# Patient Record
Sex: Male | Born: 1945 | Race: White | Hispanic: No | Marital: Married | Smoking: Never smoker
Health system: Southern US, Community
[De-identification: ages and names within clinical notes are randomized; demographics above are authoritative.]

## PROBLEM LIST (undated history)

## (undated) DIAGNOSIS — Z9981 Dependence on supplemental oxygen: Secondary | ICD-10-CM

## (undated) DIAGNOSIS — J9602 Acute respiratory failure with hypercapnia: Secondary | ICD-10-CM

## (undated) DIAGNOSIS — R7303 Prediabetes: Secondary | ICD-10-CM

## (undated) DIAGNOSIS — M1990 Unspecified osteoarthritis, unspecified site: Secondary | ICD-10-CM

## (undated) DIAGNOSIS — E291 Testicular hypofunction: Secondary | ICD-10-CM

## (undated) DIAGNOSIS — K649 Unspecified hemorrhoids: Secondary | ICD-10-CM

## (undated) DIAGNOSIS — E119 Type 2 diabetes mellitus without complications: Secondary | ICD-10-CM

## (undated) DIAGNOSIS — G8929 Other chronic pain: Secondary | ICD-10-CM

## (undated) DIAGNOSIS — G9340 Encephalopathy, unspecified: Secondary | ICD-10-CM

## (undated) DIAGNOSIS — E785 Hyperlipidemia, unspecified: Secondary | ICD-10-CM

## (undated) DIAGNOSIS — I5032 Chronic diastolic (congestive) heart failure: Secondary | ICD-10-CM

## (undated) DIAGNOSIS — R7989 Other specified abnormal findings of blood chemistry: Secondary | ICD-10-CM

## (undated) DIAGNOSIS — J309 Allergic rhinitis, unspecified: Secondary | ICD-10-CM

## (undated) DIAGNOSIS — I208 Other forms of angina pectoris: Secondary | ICD-10-CM

## (undated) DIAGNOSIS — I25118 Atherosclerotic heart disease of native coronary artery with other forms of angina pectoris: Secondary | ICD-10-CM

## (undated) DIAGNOSIS — I4891 Unspecified atrial fibrillation: Secondary | ICD-10-CM

## (undated) DIAGNOSIS — I480 Paroxysmal atrial fibrillation: Secondary | ICD-10-CM

## (undated) DIAGNOSIS — R270 Ataxia, unspecified: Secondary | ICD-10-CM

## (undated) DIAGNOSIS — J9601 Acute respiratory failure with hypoxia: Secondary | ICD-10-CM

## (undated) DIAGNOSIS — Z973 Presence of spectacles and contact lenses: Secondary | ICD-10-CM

## (undated) DIAGNOSIS — E538 Deficiency of other specified B group vitamins: Secondary | ICD-10-CM

## (undated) DIAGNOSIS — M109 Gout, unspecified: Secondary | ICD-10-CM

## (undated) DIAGNOSIS — I10 Essential (primary) hypertension: Secondary | ICD-10-CM

## (undated) DIAGNOSIS — J986 Disorders of diaphragm: Secondary | ICD-10-CM

## (undated) DIAGNOSIS — R55 Syncope and collapse: Secondary | ICD-10-CM

## (undated) DIAGNOSIS — I219 Acute myocardial infarction, unspecified: Secondary | ICD-10-CM

## (undated) DIAGNOSIS — I639 Cerebral infarction, unspecified: Secondary | ICD-10-CM

## (undated) DIAGNOSIS — K219 Gastro-esophageal reflux disease without esophagitis: Secondary | ICD-10-CM

## (undated) DIAGNOSIS — G4733 Obstructive sleep apnea (adult) (pediatric): Secondary | ICD-10-CM

## (undated) DIAGNOSIS — E039 Hypothyroidism, unspecified: Secondary | ICD-10-CM

## (undated) DIAGNOSIS — H8110 Benign paroxysmal vertigo, unspecified ear: Secondary | ICD-10-CM

## (undated) DIAGNOSIS — Z7901 Long term (current) use of anticoagulants: Secondary | ICD-10-CM

## (undated) DIAGNOSIS — I700 Atherosclerosis of aorta: Secondary | ICD-10-CM

## (undated) DIAGNOSIS — G4700 Insomnia, unspecified: Secondary | ICD-10-CM

## (undated) DIAGNOSIS — D126 Benign neoplasm of colon, unspecified: Secondary | ICD-10-CM

## (undated) DIAGNOSIS — E1139 Type 2 diabetes mellitus with other diabetic ophthalmic complication: Secondary | ICD-10-CM

## (undated) DIAGNOSIS — M545 Low back pain: Secondary | ICD-10-CM

## (undated) HISTORY — DX: Insomnia, unspecified: G47.00

## (undated) HISTORY — DX: Syncope and collapse: R55

## (undated) HISTORY — DX: Gastro-esophageal reflux disease without esophagitis: K21.9

## (undated) HISTORY — DX: Chronic diastolic (congestive) heart failure: I50.32

## (undated) HISTORY — DX: Atherosclerotic heart disease of native coronary artery with other forms of angina pectoris: I25.118

## (undated) HISTORY — DX: Other forms of angina pectoris: I20.8

## (undated) HISTORY — DX: Benign paroxysmal vertigo, unspecified ear: H81.10

## (undated) HISTORY — DX: Benign neoplasm of colon, unspecified: D12.6

## (undated) HISTORY — PX: CORONARY ARTERY BYPASS GRAFT: SHX141

## (undated) HISTORY — DX: Allergic rhinitis, unspecified: J30.9

## (undated) HISTORY — DX: Low back pain: M54.5

## (undated) HISTORY — DX: Obstructive sleep apnea (adult) (pediatric): G47.33

## (undated) HISTORY — PX: PERCUTANEOUS PLACEMENT INTRAVASCULAR STENT CERVICAL CAROTID ARTERY: SUR1019

## (undated) HISTORY — DX: Acute respiratory failure with hypercapnia: J96.02

## (undated) HISTORY — DX: Long term (current) use of anticoagulants: Z79.01

## (undated) HISTORY — PX: NASAL SEPTUM SURGERY: SHX37

## (undated) HISTORY — DX: Type 2 diabetes mellitus without complications: E11.9

## (undated) HISTORY — DX: Gout, unspecified: M10.9

## (undated) HISTORY — DX: Acute respiratory failure with hypoxia: J96.01

## (undated) HISTORY — DX: Hypothyroidism, unspecified: E03.9

## (undated) HISTORY — DX: Other chronic pain: G89.29

## (undated) HISTORY — DX: Other specified abnormal findings of blood chemistry: R79.89

## (undated) HISTORY — DX: Atherosclerosis of aorta: I70.0

## (undated) HISTORY — DX: Type 2 diabetes mellitus with other diabetic ophthalmic complication: E11.39

## (undated) HISTORY — DX: Ataxia, unspecified: R27.0

## (undated) HISTORY — DX: Testicular hypofunction: E29.1

## (undated) HISTORY — DX: Deficiency of other specified B group vitamins: E53.8

## (undated) HISTORY — DX: Hyperlipidemia, unspecified: E78.5

## (undated) HISTORY — DX: Cerebral infarction, unspecified: I63.9

## (undated) HISTORY — DX: Essential (primary) hypertension: I10

## (undated) HISTORY — DX: Encephalopathy, unspecified: G93.40

## (undated) HISTORY — DX: Paroxysmal atrial fibrillation: I48.0

---
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
## 2004-11-08 ENCOUNTER — Ambulatory Visit: Payer: Self-pay | Admitting: Internal Medicine

## 2004-11-09 ENCOUNTER — Inpatient Hospital Stay (HOSPITAL_COMMUNITY): Admission: EM | Admit: 2004-11-09 | Discharge: 2004-11-13 | Payer: Self-pay | Admitting: Emergency Medicine

## 2004-11-11 ENCOUNTER — Ambulatory Visit: Payer: Self-pay | Admitting: Cardiology

## 2004-11-20 ENCOUNTER — Ambulatory Visit: Payer: Self-pay

## 2004-12-04 ENCOUNTER — Ambulatory Visit: Payer: Self-pay | Admitting: Gastroenterology

## 2004-12-11 ENCOUNTER — Ambulatory Visit: Payer: Self-pay | Admitting: *Deleted

## 2005-02-24 ENCOUNTER — Ambulatory Visit: Payer: Self-pay | Admitting: Internal Medicine

## 2005-03-07 ENCOUNTER — Ambulatory Visit: Payer: Self-pay | Admitting: Gastroenterology

## 2005-03-25 ENCOUNTER — Encounter (INDEPENDENT_AMBULATORY_CARE_PROVIDER_SITE_OTHER): Payer: Self-pay | Admitting: *Deleted

## 2005-03-25 ENCOUNTER — Ambulatory Visit: Payer: Self-pay | Admitting: Gastroenterology

## 2005-03-25 HISTORY — PX: ESOPHAGOGASTRODUODENOSCOPY: SHX1529

## 2005-03-31 ENCOUNTER — Ambulatory Visit: Payer: Self-pay | Admitting: Internal Medicine

## 2005-04-01 ENCOUNTER — Ambulatory Visit: Payer: Self-pay | Admitting: *Deleted

## 2005-04-16 ENCOUNTER — Ambulatory Visit: Payer: Self-pay | Admitting: Internal Medicine

## 2005-05-16 ENCOUNTER — Ambulatory Visit: Payer: Self-pay | Admitting: Internal Medicine

## 2005-08-08 ENCOUNTER — Ambulatory Visit: Payer: Self-pay | Admitting: Internal Medicine

## 2005-08-18 ENCOUNTER — Ambulatory Visit: Payer: Self-pay | Admitting: Cardiology

## 2005-08-18 ENCOUNTER — Observation Stay (HOSPITAL_COMMUNITY): Admission: EM | Admit: 2005-08-18 | Discharge: 2005-08-20 | Payer: Self-pay | Admitting: Emergency Medicine

## 2005-08-28 ENCOUNTER — Encounter: Payer: Self-pay | Admitting: Cardiology

## 2005-08-28 ENCOUNTER — Ambulatory Visit: Payer: Self-pay

## 2005-09-03 ENCOUNTER — Ambulatory Visit: Payer: Self-pay | Admitting: *Deleted

## 2005-09-18 ENCOUNTER — Ambulatory Visit (HOSPITAL_COMMUNITY): Admission: RE | Admit: 2005-09-18 | Discharge: 2005-09-18 | Payer: Self-pay | Admitting: Otolaryngology

## 2005-10-08 ENCOUNTER — Ambulatory Visit: Payer: Self-pay | Admitting: Internal Medicine

## 2005-10-29 ENCOUNTER — Emergency Department (HOSPITAL_COMMUNITY): Admission: EM | Admit: 2005-10-29 | Discharge: 2005-10-29 | Payer: Self-pay | Admitting: Emergency Medicine

## 2005-11-20 ENCOUNTER — Ambulatory Visit: Payer: Self-pay | Admitting: *Deleted

## 2005-11-24 ENCOUNTER — Ambulatory Visit: Payer: Self-pay | Admitting: Internal Medicine

## 2005-11-26 ENCOUNTER — Ambulatory Visit (HOSPITAL_BASED_OUTPATIENT_CLINIC_OR_DEPARTMENT_OTHER): Admission: RE | Admit: 2005-11-26 | Discharge: 2005-11-26 | Payer: Self-pay | Admitting: Internal Medicine

## 2005-11-30 ENCOUNTER — Ambulatory Visit: Payer: Self-pay | Admitting: Internal Medicine

## 2005-12-10 ENCOUNTER — Ambulatory Visit: Payer: Self-pay | Admitting: Internal Medicine

## 2005-12-16 ENCOUNTER — Ambulatory Visit: Payer: Self-pay | Admitting: Internal Medicine

## 2006-01-16 ENCOUNTER — Ambulatory Visit: Payer: Self-pay | Admitting: Internal Medicine

## 2006-03-05 ENCOUNTER — Ambulatory Visit: Payer: Self-pay | Admitting: Cardiology

## 2006-03-05 ENCOUNTER — Observation Stay (HOSPITAL_COMMUNITY): Admission: EM | Admit: 2006-03-05 | Discharge: 2006-03-06 | Payer: Self-pay | Admitting: Emergency Medicine

## 2006-03-11 ENCOUNTER — Ambulatory Visit: Payer: Self-pay | Admitting: Endocrinology

## 2006-03-18 ENCOUNTER — Ambulatory Visit: Payer: Self-pay | Admitting: *Deleted

## 2006-04-21 ENCOUNTER — Ambulatory Visit: Payer: Self-pay | Admitting: Internal Medicine

## 2006-05-06 ENCOUNTER — Encounter: Admission: RE | Admit: 2006-05-06 | Discharge: 2006-05-06 | Payer: Self-pay | Admitting: Otolaryngology

## 2006-06-17 ENCOUNTER — Ambulatory Visit: Payer: Self-pay | Admitting: Internal Medicine

## 2006-06-30 ENCOUNTER — Ambulatory Visit: Payer: Self-pay

## 2006-06-30 ENCOUNTER — Ambulatory Visit: Payer: Self-pay | Admitting: *Deleted

## 2006-06-30 LAB — CONVERTED CEMR LAB
AST: 25 units/L (ref 0–37)
Albumin: 3.5 g/dL (ref 3.5–5.2)
Bilirubin, Direct: 0.1 mg/dL (ref 0.0–0.3)
Cholesterol: 135 mg/dL (ref 0–200)
GFR calc Af Amer: 110 mL/min
GFR calc non Af Amer: 91 mL/min
Glucose, Bld: 95 mg/dL (ref 70–99)
HDL: 43 mg/dL (ref >39.0)
LDL Cholesterol: 76 mg/dL (ref 0–99)
Potassium: 4.2 meq/L (ref 3.5–5.1)
Sodium: 146 meq/L — ABNORMAL HIGH (ref 135–145)
Total CHOL/HDL Ratio: 3.1
Triglycerides: 79 mg/dL (ref 0–149)
VLDL: 16 mg/dL (ref 0–40)

## 2006-07-03 ENCOUNTER — Emergency Department (HOSPITAL_COMMUNITY): Admission: EM | Admit: 2006-07-03 | Discharge: 2006-07-03 | Payer: Self-pay | Admitting: Emergency Medicine

## 2006-07-29 ENCOUNTER — Ambulatory Visit (HOSPITAL_BASED_OUTPATIENT_CLINIC_OR_DEPARTMENT_OTHER): Admission: RE | Admit: 2006-07-29 | Discharge: 2006-07-29 | Payer: Self-pay | Admitting: Orthopedic Surgery

## 2006-09-19 ENCOUNTER — Emergency Department (HOSPITAL_COMMUNITY): Admission: EM | Admit: 2006-09-19 | Discharge: 2006-09-19 | Payer: Self-pay | Admitting: Emergency Medicine

## 2006-10-12 ENCOUNTER — Ambulatory Visit: Payer: Self-pay | Admitting: Internal Medicine

## 2006-11-20 ENCOUNTER — Encounter
Admission: RE | Admit: 2006-11-20 | Discharge: 2007-02-18 | Payer: Self-pay | Admitting: Physical Medicine & Rehabilitation

## 2006-12-02 ENCOUNTER — Ambulatory Visit: Payer: Self-pay | Admitting: Physical Medicine & Rehabilitation

## 2006-12-12 ENCOUNTER — Encounter: Payer: Self-pay | Admitting: *Deleted

## 2006-12-12 DIAGNOSIS — I6930 Unspecified sequelae of cerebral infarction: Secondary | ICD-10-CM | POA: Insufficient documentation

## 2006-12-12 DIAGNOSIS — K219 Gastro-esophageal reflux disease without esophagitis: Secondary | ICD-10-CM

## 2006-12-12 DIAGNOSIS — I10 Essential (primary) hypertension: Secondary | ICD-10-CM | POA: Insufficient documentation

## 2006-12-12 DIAGNOSIS — M109 Gout, unspecified: Secondary | ICD-10-CM

## 2006-12-12 DIAGNOSIS — E785 Hyperlipidemia, unspecified: Secondary | ICD-10-CM | POA: Insufficient documentation

## 2006-12-12 HISTORY — DX: Gastro-esophageal reflux disease without esophagitis: K21.9

## 2006-12-12 HISTORY — DX: Essential (primary) hypertension: I10

## 2006-12-12 HISTORY — DX: Hyperlipidemia, unspecified: E78.5

## 2006-12-12 HISTORY — DX: Gout, unspecified: M10.9

## 2007-01-21 ENCOUNTER — Encounter
Admission: RE | Admit: 2007-01-21 | Discharge: 2007-01-21 | Payer: Self-pay | Admitting: Physical Medicine & Rehabilitation

## 2007-01-25 ENCOUNTER — Ambulatory Visit: Payer: Self-pay | Admitting: Physical Medicine & Rehabilitation

## 2007-02-20 ENCOUNTER — Encounter: Payer: Self-pay | Admitting: Internal Medicine

## 2007-03-15 ENCOUNTER — Ambulatory Visit: Payer: Self-pay | Admitting: Physical Medicine & Rehabilitation

## 2007-03-15 ENCOUNTER — Encounter
Admission: RE | Admit: 2007-03-15 | Discharge: 2007-05-27 | Payer: Self-pay | Admitting: Physical Medicine & Rehabilitation

## 2007-04-14 ENCOUNTER — Encounter
Admission: RE | Admit: 2007-04-14 | Discharge: 2007-04-15 | Payer: Self-pay | Admitting: Physical Medicine & Rehabilitation

## 2007-04-21 ENCOUNTER — Ambulatory Visit: Payer: Self-pay | Admitting: Internal Medicine

## 2007-04-22 ENCOUNTER — Ambulatory Visit: Payer: Self-pay | Admitting: Internal Medicine

## 2007-04-22 LAB — CONVERTED CEMR LAB
ALT: 49 units/L (ref 0–53)
AST: 40 units/L — ABNORMAL HIGH (ref 0–37)
Bilirubin, Direct: 0.2 mg/dL (ref 0.0–0.3)
CO2: 30 meq/L (ref 19–32)
Calcium: 9.1 mg/dL (ref 8.4–10.5)
Cholesterol: 126 mg/dL (ref 0–200)
GFR calc Af Amer: 79 mL/min
GFR calc non Af Amer: 65 mL/min
Glucose, Bld: 113 mg/dL — ABNORMAL HIGH (ref 70–99)
HDL: 32 mg/dL — ABNORMAL LOW (ref >39.0)
LDL Cholesterol: 77 mg/dL (ref 0–99)
Sodium: 142 meq/L (ref 135–145)
Total CHOL/HDL Ratio: 3.9
Total Protein: 6.3 g/dL (ref 6.0–8.3)

## 2007-06-11 ENCOUNTER — Ambulatory Visit: Payer: Self-pay | Admitting: Physical Medicine & Rehabilitation

## 2007-06-17 ENCOUNTER — Ambulatory Visit: Payer: Self-pay | Admitting: Internal Medicine

## 2007-06-17 DIAGNOSIS — G4733 Obstructive sleep apnea (adult) (pediatric): Secondary | ICD-10-CM

## 2007-06-17 HISTORY — DX: Obstructive sleep apnea (adult) (pediatric): G47.33

## 2007-07-02 ENCOUNTER — Encounter: Payer: Self-pay | Admitting: Internal Medicine

## 2007-07-06 ENCOUNTER — Encounter
Admission: RE | Admit: 2007-07-06 | Discharge: 2007-10-04 | Payer: Self-pay | Admitting: Physical Medicine & Rehabilitation

## 2007-07-12 ENCOUNTER — Ambulatory Visit: Payer: Self-pay | Admitting: Physical Medicine & Rehabilitation

## 2007-08-16 ENCOUNTER — Ambulatory Visit: Payer: Self-pay | Admitting: Physical Medicine & Rehabilitation

## 2007-10-01 ENCOUNTER — Ambulatory Visit: Payer: Self-pay | Admitting: Physical Medicine & Rehabilitation

## 2007-10-05 ENCOUNTER — Ambulatory Visit: Payer: Self-pay | Admitting: Internal Medicine

## 2007-10-05 DIAGNOSIS — E119 Type 2 diabetes mellitus without complications: Secondary | ICD-10-CM | POA: Insufficient documentation

## 2007-10-05 HISTORY — DX: Type 2 diabetes mellitus without complications: E11.9

## 2007-10-05 LAB — CONVERTED CEMR LAB
Calcium: 9.3 mg/dL (ref 8.4–10.5)
Chloride: 104 meq/L (ref 96–112)
Creatinine, Ser: 0.8 mg/dL (ref 0.4–1.5)
GFR calc non Af Amer: 103 mL/min
Hgb A1c MFr Bld: 6 % (ref 4.6–6.0)
Sodium: 140 meq/L (ref 135–145)

## 2007-10-11 ENCOUNTER — Telehealth: Payer: Self-pay | Admitting: Internal Medicine

## 2007-11-17 ENCOUNTER — Telehealth: Payer: Self-pay | Admitting: Internal Medicine

## 2007-11-19 ENCOUNTER — Ambulatory Visit: Payer: Self-pay | Admitting: Physical Medicine & Rehabilitation

## 2007-11-23 ENCOUNTER — Encounter
Admission: RE | Admit: 2007-11-23 | Discharge: 2007-11-23 | Payer: Self-pay | Admitting: Physical Medicine & Rehabilitation

## 2007-11-23 ENCOUNTER — Ambulatory Visit: Payer: Self-pay | Admitting: Physical Medicine & Rehabilitation

## 2007-12-10 ENCOUNTER — Telehealth: Payer: Self-pay | Admitting: Internal Medicine

## 2007-12-21 ENCOUNTER — Ambulatory Visit: Payer: Self-pay | Admitting: Internal Medicine

## 2007-12-30 ENCOUNTER — Encounter: Payer: Self-pay | Admitting: Internal Medicine

## 2008-01-17 ENCOUNTER — Ambulatory Visit: Payer: Self-pay | Admitting: Physical Medicine & Rehabilitation

## 2008-01-17 ENCOUNTER — Encounter
Admission: RE | Admit: 2008-01-17 | Discharge: 2008-02-28 | Payer: Self-pay | Admitting: Physical Medicine & Rehabilitation

## 2008-01-21 ENCOUNTER — Ambulatory Visit: Payer: Self-pay | Admitting: Physical Medicine & Rehabilitation

## 2008-01-31 ENCOUNTER — Emergency Department (HOSPITAL_COMMUNITY): Admission: EM | Admit: 2008-01-31 | Discharge: 2008-01-31 | Payer: Self-pay | Admitting: Emergency Medicine

## 2008-02-02 ENCOUNTER — Ambulatory Visit: Payer: Self-pay | Admitting: Internal Medicine

## 2008-02-02 ENCOUNTER — Telehealth (INDEPENDENT_AMBULATORY_CARE_PROVIDER_SITE_OTHER): Payer: Self-pay | Admitting: *Deleted

## 2008-02-02 DIAGNOSIS — L03119 Cellulitis of unspecified part of limb: Secondary | ICD-10-CM

## 2008-02-02 DIAGNOSIS — L02619 Cutaneous abscess of unspecified foot: Secondary | ICD-10-CM | POA: Insufficient documentation

## 2008-02-02 LAB — CONVERTED CEMR LAB
CO2: 34 meq/L — ABNORMAL HIGH (ref 19–32)
Chloride: 101 meq/L (ref 96–112)
Eosinophils Relative: 7.4 % — ABNORMAL HIGH (ref 0.0–5.0)
GFR calc Af Amer: 97 mL/min
Glucose, Bld: 109 mg/dL — ABNORMAL HIGH (ref 70–99)
Lymphocytes Relative: 24.3 % (ref 12.0–46.0)
Monocytes Relative: 6.4 % (ref 3.0–12.0)
Platelets: 149 10*3/uL — ABNORMAL LOW (ref 150–400)
Potassium: 4.3 meq/L (ref 3.5–5.1)
RDW: 13.2 % (ref 11.5–14.6)
Sodium: 141 meq/L (ref 135–145)
WBC: 7.5 10*3/uL (ref 4.5–10.5)

## 2008-02-11 ENCOUNTER — Ambulatory Visit: Payer: Self-pay | Admitting: Internal Medicine

## 2008-02-22 ENCOUNTER — Encounter: Payer: Self-pay | Admitting: Internal Medicine

## 2008-02-22 ENCOUNTER — Ambulatory Visit: Payer: Self-pay

## 2008-02-28 ENCOUNTER — Ambulatory Visit: Payer: Self-pay | Admitting: Physical Medicine & Rehabilitation

## 2008-03-06 ENCOUNTER — Ambulatory Visit: Payer: Self-pay | Admitting: Internal Medicine

## 2008-03-21 ENCOUNTER — Ambulatory Visit: Payer: Self-pay | Admitting: Internal Medicine

## 2008-03-21 DIAGNOSIS — L84 Corns and callosities: Secondary | ICD-10-CM | POA: Insufficient documentation

## 2008-04-24 ENCOUNTER — Encounter
Admission: RE | Admit: 2008-04-24 | Discharge: 2008-07-23 | Payer: Self-pay | Admitting: Physical Medicine & Rehabilitation

## 2008-04-25 ENCOUNTER — Ambulatory Visit: Payer: Self-pay | Admitting: Physical Medicine & Rehabilitation

## 2008-04-25 ENCOUNTER — Encounter
Admission: RE | Admit: 2008-04-25 | Discharge: 2008-04-25 | Payer: Self-pay | Admitting: Physical Medicine & Rehabilitation

## 2008-05-25 ENCOUNTER — Ambulatory Visit: Payer: Self-pay | Admitting: Physical Medicine & Rehabilitation

## 2008-06-19 ENCOUNTER — Ambulatory Visit: Payer: Self-pay | Admitting: Internal Medicine

## 2008-07-19 ENCOUNTER — Ambulatory Visit: Payer: Self-pay | Admitting: Internal Medicine

## 2008-07-19 LAB — CONVERTED CEMR LAB
CO2: 29 meq/L (ref 19–32)
Chloride: 109 meq/L (ref 96–112)
GFR calc non Af Amer: 79.66 mL/min (ref >60)
Glucose, Bld: 106 mg/dL — ABNORMAL HIGH (ref 70–99)
HDL: 38.8 mg/dL — ABNORMAL LOW (ref >39.00)
Hgb A1c MFr Bld: 5.9 % (ref 4.6–6.5)
LDL Cholesterol: 61 mg/dL (ref 0–99)
Potassium: 3.9 meq/L (ref 3.5–5.1)
Sodium: 143 meq/L (ref 135–145)
VLDL: 14.6 mg/dL (ref 0.0–40.0)

## 2008-07-21 ENCOUNTER — Ambulatory Visit: Payer: Self-pay | Admitting: Physical Medicine & Rehabilitation

## 2008-07-28 ENCOUNTER — Ambulatory Visit: Payer: Self-pay | Admitting: Internal Medicine

## 2008-08-11 ENCOUNTER — Ambulatory Visit: Payer: Self-pay | Admitting: Physical Medicine & Rehabilitation

## 2008-09-21 ENCOUNTER — Encounter
Admission: RE | Admit: 2008-09-21 | Discharge: 2008-11-14 | Payer: Self-pay | Admitting: Physical Medicine & Rehabilitation

## 2008-09-21 ENCOUNTER — Ambulatory Visit: Payer: Self-pay | Admitting: Physical Medicine & Rehabilitation

## 2008-11-14 ENCOUNTER — Ambulatory Visit: Payer: Self-pay | Admitting: Physical Medicine & Rehabilitation

## 2008-11-30 ENCOUNTER — Telehealth: Payer: Self-pay | Admitting: Internal Medicine

## 2008-12-01 ENCOUNTER — Ambulatory Visit: Payer: Self-pay | Admitting: Internal Medicine

## 2008-12-01 DIAGNOSIS — J018 Other acute sinusitis: Secondary | ICD-10-CM | POA: Insufficient documentation

## 2008-12-18 ENCOUNTER — Ambulatory Visit: Payer: Self-pay | Admitting: Internal Medicine

## 2008-12-29 ENCOUNTER — Encounter
Admission: RE | Admit: 2008-12-29 | Discharge: 2008-12-29 | Payer: Self-pay | Admitting: Physical Medicine & Rehabilitation

## 2009-01-29 ENCOUNTER — Ambulatory Visit: Payer: Self-pay | Admitting: Internal Medicine

## 2009-01-29 DIAGNOSIS — M545 Low back pain, unspecified: Secondary | ICD-10-CM

## 2009-01-29 DIAGNOSIS — M549 Dorsalgia, unspecified: Secondary | ICD-10-CM | POA: Insufficient documentation

## 2009-01-29 HISTORY — DX: Low back pain, unspecified: M54.50

## 2009-03-16 ENCOUNTER — Inpatient Hospital Stay (HOSPITAL_COMMUNITY): Admission: EM | Admit: 2009-03-16 | Discharge: 2009-03-21 | Payer: Self-pay | Admitting: Emergency Medicine

## 2009-03-16 ENCOUNTER — Ambulatory Visit: Payer: Self-pay | Admitting: Internal Medicine

## 2009-03-21 ENCOUNTER — Telehealth (INDEPENDENT_AMBULATORY_CARE_PROVIDER_SITE_OTHER): Payer: Self-pay

## 2009-03-22 ENCOUNTER — Encounter
Admission: RE | Admit: 2009-03-22 | Discharge: 2009-06-20 | Payer: Self-pay | Admitting: Physical Medicine & Rehabilitation

## 2009-03-23 ENCOUNTER — Ambulatory Visit: Payer: Self-pay | Admitting: Physical Medicine & Rehabilitation

## 2009-03-23 ENCOUNTER — Telehealth: Payer: Self-pay | Admitting: Internal Medicine

## 2009-03-26 ENCOUNTER — Ambulatory Visit: Payer: Self-pay | Admitting: Gastroenterology

## 2009-03-28 ENCOUNTER — Encounter: Payer: Self-pay | Admitting: Internal Medicine

## 2009-04-02 ENCOUNTER — Ambulatory Visit: Payer: Self-pay | Admitting: Internal Medicine

## 2009-04-02 ENCOUNTER — Encounter: Payer: Self-pay | Admitting: Internal Medicine

## 2009-04-02 DIAGNOSIS — E039 Hypothyroidism, unspecified: Secondary | ICD-10-CM | POA: Insufficient documentation

## 2009-04-02 LAB — CONVERTED CEMR LAB
Alkaline Phosphatase: 51 units/L (ref 39–117)
BUN: 19 mg/dL (ref 6–23)
Bilirubin, Direct: 0.2 mg/dL (ref 0.0–0.3)
Chloride: 99 meq/L (ref 96–112)
Creatinine, Ser: 1.16 mg/dL (ref 0.40–1.50)
Glucose, Bld: 104 mg/dL — ABNORMAL HIGH (ref 70–99)
Hgb A1c MFr Bld: 5.9 % (ref 4.6–6.1)
Indirect Bilirubin: 0.6 mg/dL (ref 0.0–0.9)
LDL Cholesterol: 48 mg/dL (ref 0–99)
Potassium: 4.8 meq/L (ref 3.5–5.3)
TSH: 4.513 microintl units/mL — ABNORMAL HIGH (ref 0.350–4.500)
Thyroperoxidase Ab SerPl-aCnc: 350.5 — ABNORMAL HIGH (ref 0.0–60.0)
Total Bilirubin: 0.8 mg/dL (ref 0.3–1.2)
Triglycerides: 67 mg/dL (ref <150)
VLDL: 13 mg/dL (ref 0–40)

## 2009-04-05 ENCOUNTER — Telehealth: Payer: Self-pay | Admitting: Internal Medicine

## 2009-04-05 ENCOUNTER — Encounter: Payer: Self-pay | Admitting: Internal Medicine

## 2009-04-20 ENCOUNTER — Ambulatory Visit: Payer: Self-pay | Admitting: Internal Medicine

## 2009-04-27 ENCOUNTER — Ambulatory Visit: Payer: Self-pay | Admitting: Internal Medicine

## 2009-05-04 DIAGNOSIS — Z951 Presence of aortocoronary bypass graft: Secondary | ICD-10-CM | POA: Insufficient documentation

## 2009-05-07 ENCOUNTER — Encounter (INDEPENDENT_AMBULATORY_CARE_PROVIDER_SITE_OTHER): Payer: Self-pay | Admitting: *Deleted

## 2009-05-14 ENCOUNTER — Ambulatory Visit: Payer: Self-pay | Admitting: Internal Medicine

## 2009-05-14 LAB — CONVERTED CEMR LAB: TSH: 5.067 microintl units/mL — ABNORMAL HIGH (ref 0.350–4.500)

## 2009-05-15 ENCOUNTER — Telehealth: Payer: Self-pay | Admitting: Internal Medicine

## 2009-05-17 ENCOUNTER — Inpatient Hospital Stay (HOSPITAL_COMMUNITY): Admission: EM | Admit: 2009-05-17 | Discharge: 2009-05-19 | Payer: Self-pay | Admitting: Emergency Medicine

## 2009-05-22 ENCOUNTER — Ambulatory Visit: Payer: Self-pay | Admitting: Internal Medicine

## 2009-05-22 ENCOUNTER — Ambulatory Visit: Payer: Self-pay | Admitting: Physical Medicine & Rehabilitation

## 2009-05-22 DIAGNOSIS — R42 Dizziness and giddiness: Secondary | ICD-10-CM | POA: Insufficient documentation

## 2009-05-24 ENCOUNTER — Encounter: Payer: Self-pay | Admitting: Internal Medicine

## 2009-05-24 LAB — CONVERTED CEMR LAB
BUN: 23 mg/dL (ref 6–23)
Basophils Relative: 0.2 % (ref 0.0–3.0)
CO2: 32 meq/L (ref 19–32)
Chloride: 103 meq/L (ref 96–112)
Eosinophils Absolute: 0.2 10*3/uL (ref 0.0–0.7)
Eosinophils Relative: 2.8 % (ref 0.0–5.0)
Hemoglobin: 13.2 g/dL (ref 13.0–17.0)
Lymphocytes Relative: 34.4 % (ref 12.0–46.0)
MCHC: 33.1 g/dL (ref 30.0–36.0)
Monocytes Relative: 8.4 % (ref 3.0–12.0)
Neutro Abs: 3.1 10*3/uL (ref 1.4–7.7)
Neutrophils Relative %: 54.2 % (ref 43.0–77.0)
Potassium: 3.6 meq/L (ref 3.5–5.1)
RBC: 4.44 M/uL (ref 4.22–5.81)
WBC: 5.8 10*3/uL (ref 4.5–10.5)

## 2009-05-28 ENCOUNTER — Ambulatory Visit: Payer: Self-pay | Admitting: Internal Medicine

## 2009-06-20 ENCOUNTER — Telehealth: Payer: Self-pay | Admitting: Internal Medicine

## 2009-06-26 ENCOUNTER — Telehealth: Payer: Self-pay | Admitting: Internal Medicine

## 2009-07-03 ENCOUNTER — Telehealth: Payer: Self-pay | Admitting: Internal Medicine

## 2009-07-17 ENCOUNTER — Encounter
Admission: RE | Admit: 2009-07-17 | Discharge: 2009-09-13 | Payer: Self-pay | Admitting: Physical Medicine & Rehabilitation

## 2009-07-20 ENCOUNTER — Ambulatory Visit: Payer: Self-pay | Admitting: Physical Medicine & Rehabilitation

## 2009-08-06 ENCOUNTER — Encounter: Payer: Self-pay | Admitting: Internal Medicine

## 2009-08-08 ENCOUNTER — Telehealth: Payer: Self-pay | Admitting: Internal Medicine

## 2009-08-22 ENCOUNTER — Telehealth: Payer: Self-pay | Admitting: Internal Medicine

## 2009-08-24 ENCOUNTER — Ambulatory Visit: Payer: Self-pay | Admitting: Internal Medicine

## 2009-08-24 LAB — CONVERTED CEMR LAB
BUN: 18 mg/dL (ref 6–23)
CO2: 28 meq/L (ref 19–32)
Calcium: 8.9 mg/dL (ref 8.4–10.5)
Glucose, Bld: 99 mg/dL (ref 70–99)
Potassium: 4.1 meq/L (ref 3.5–5.3)
Sodium: 142 meq/L (ref 135–145)

## 2009-08-27 ENCOUNTER — Telehealth: Payer: Self-pay | Admitting: Internal Medicine

## 2009-09-07 ENCOUNTER — Encounter: Payer: Self-pay | Admitting: Internal Medicine

## 2009-09-13 ENCOUNTER — Ambulatory Visit: Payer: Self-pay | Admitting: Physical Medicine & Rehabilitation

## 2009-09-14 ENCOUNTER — Telehealth: Payer: Self-pay | Admitting: Internal Medicine

## 2009-11-05 ENCOUNTER — Encounter
Admission: RE | Admit: 2009-11-05 | Discharge: 2010-02-03 | Payer: Self-pay | Source: Home / Self Care | Admitting: Physical Medicine & Rehabilitation

## 2009-11-12 ENCOUNTER — Ambulatory Visit: Payer: Self-pay | Admitting: Physical Medicine & Rehabilitation

## 2009-11-23 ENCOUNTER — Ambulatory Visit: Payer: Self-pay | Admitting: Internal Medicine

## 2009-11-23 DIAGNOSIS — H8110 Benign paroxysmal vertigo, unspecified ear: Secondary | ICD-10-CM

## 2009-11-23 HISTORY — DX: Benign paroxysmal vertigo, unspecified ear: H81.10

## 2009-11-23 LAB — CONVERTED CEMR LAB
BUN: 15 mg/dL (ref 6–23)
Calcium: 9.3 mg/dL (ref 8.4–10.5)
Glucose, Bld: 85 mg/dL (ref 70–99)
Hemoglobin: 13.4 g/dL (ref 13.0–17.0)
MCHC: 32.1 g/dL (ref 30.0–36.0)
MCV: 88.2 fL (ref 78.0–100.0)
RBC: 4.73 M/uL (ref 4.22–5.81)
RDW: 13.9 % (ref 11.5–15.5)
TSH: 3.69 microintl units/mL (ref 0.350–4.500)

## 2009-11-26 ENCOUNTER — Telehealth: Payer: Self-pay | Admitting: Internal Medicine

## 2009-11-26 ENCOUNTER — Encounter: Payer: Self-pay | Admitting: Internal Medicine

## 2009-12-03 ENCOUNTER — Encounter: Admission: RE | Admit: 2009-12-03 | Discharge: 2009-12-14 | Payer: Self-pay | Admitting: Internal Medicine

## 2009-12-10 ENCOUNTER — Encounter: Payer: Self-pay | Admitting: Internal Medicine

## 2009-12-10 ENCOUNTER — Telehealth: Payer: Self-pay | Admitting: Internal Medicine

## 2009-12-11 ENCOUNTER — Telehealth: Payer: Self-pay | Admitting: Internal Medicine

## 2009-12-14 ENCOUNTER — Telehealth: Payer: Self-pay | Admitting: Internal Medicine

## 2009-12-17 ENCOUNTER — Ambulatory Visit: Payer: Self-pay | Admitting: Internal Medicine

## 2009-12-20 ENCOUNTER — Telehealth: Payer: Self-pay | Admitting: Internal Medicine

## 2009-12-25 ENCOUNTER — Ambulatory Visit: Payer: Self-pay | Admitting: Physical Medicine & Rehabilitation

## 2009-12-26 ENCOUNTER — Encounter: Payer: Self-pay | Admitting: Internal Medicine

## 2009-12-28 ENCOUNTER — Ambulatory Visit: Payer: Self-pay | Admitting: Internal Medicine

## 2009-12-30 ENCOUNTER — Encounter: Admission: RE | Admit: 2009-12-30 | Discharge: 2009-12-30 | Payer: Self-pay | Admitting: Internal Medicine

## 2009-12-31 ENCOUNTER — Telehealth: Payer: Self-pay | Admitting: Internal Medicine

## 2010-01-01 ENCOUNTER — Encounter: Payer: Self-pay | Admitting: Internal Medicine

## 2010-01-16 ENCOUNTER — Telehealth: Payer: Self-pay | Admitting: Internal Medicine

## 2010-01-17 ENCOUNTER — Telehealth: Payer: Self-pay | Admitting: Internal Medicine

## 2010-01-17 ENCOUNTER — Encounter: Payer: Self-pay | Admitting: Cardiology

## 2010-01-17 ENCOUNTER — Ambulatory Visit: Payer: Self-pay | Admitting: Cardiology

## 2010-01-19 ENCOUNTER — Telehealth: Payer: Self-pay | Admitting: Internal Medicine

## 2010-01-25 ENCOUNTER — Ambulatory Visit: Payer: Self-pay | Admitting: Internal Medicine

## 2010-02-04 ENCOUNTER — Ambulatory Visit: Payer: Self-pay | Admitting: Internal Medicine

## 2010-02-11 ENCOUNTER — Telehealth: Payer: Self-pay | Admitting: Internal Medicine

## 2010-02-13 ENCOUNTER — Ambulatory Visit: Payer: Self-pay | Admitting: Internal Medicine

## 2010-02-13 DIAGNOSIS — J309 Allergic rhinitis, unspecified: Secondary | ICD-10-CM

## 2010-02-13 HISTORY — DX: Allergic rhinitis, unspecified: J30.9

## 2010-02-21 ENCOUNTER — Encounter
Admission: RE | Admit: 2010-02-21 | Discharge: 2010-04-05 | Payer: Self-pay | Source: Home / Self Care | Attending: Physical Medicine & Rehabilitation | Admitting: Physical Medicine & Rehabilitation

## 2010-02-26 ENCOUNTER — Encounter: Payer: Self-pay | Admitting: Internal Medicine

## 2010-02-26 ENCOUNTER — Ambulatory Visit: Payer: Self-pay | Admitting: Physical Medicine & Rehabilitation

## 2010-03-01 ENCOUNTER — Ambulatory Visit: Payer: Self-pay | Admitting: Internal Medicine

## 2010-03-27 ENCOUNTER — Encounter: Payer: Self-pay | Admitting: Gastroenterology

## 2010-04-01 ENCOUNTER — Encounter
Admission: RE | Admit: 2010-04-01 | Discharge: 2010-04-05 | Payer: Self-pay | Source: Home / Self Care | Attending: Physical Medicine & Rehabilitation | Admitting: Physical Medicine & Rehabilitation

## 2010-04-05 ENCOUNTER — Ambulatory Visit
Admission: RE | Admit: 2010-04-05 | Discharge: 2010-04-05 | Payer: Self-pay | Source: Home / Self Care | Attending: Physical Medicine & Rehabilitation | Admitting: Physical Medicine & Rehabilitation

## 2010-04-07 ENCOUNTER — Encounter: Payer: Self-pay | Admitting: Internal Medicine

## 2010-04-11 ENCOUNTER — Encounter: Payer: Self-pay | Admitting: Internal Medicine

## 2010-04-16 NOTE — Progress Notes (Signed)
Summary: Tramadol Refill  Phone Note Refill Request Message from:  Fax from Pharmacy on March 23, 2009 11:32 AM  Refills Requested: Medication #1:  tramadol hcl 50 mg tab   Dosage confirmed as above?Dosage Confirmed   Brand Name Necessary? No   Supply Requested: 3 months   Last Refilled: 12/17/2008  Method Requested: Electronic Next Appointment Scheduled: 03-26-09 830 dr Russella Dar  Initial call taken by: Roselle Locus,  March 23, 2009 11:33 AM  Follow-up for Phone Call        ok to refill x 1 Follow-up by: D. Thomos Lemons DO,  March 23, 2009 12:56 PM    New/Updated Medications: TRAMADOL HCL 50 MG TABS (TRAMADOL HCL) Take 1 tablet by mouth once a day Prescriptions: TRAMADOL HCL 50 MG TABS (TRAMADOL HCL) Take 1 tablet by mouth once a day  #90 x 0   Entered by:   Glendell Docker CMA   Authorized by:   D. Thomos Lemons DO   Signed by:   Glendell Docker CMA on 03/23/2009   Method used:   [facility]  [address] [id number]       [address]       [place], [place]  [address]       Ph: [phone] or [phone]       Fax: [phone]   RxID:   [id number]

## 2010-04-16 NOTE — Miscellaneous (Signed)
Summary: PT Initial Summary/[facility]  PT Initial [facility]   Imported By: Lanelle Bal 12/24/2009 11:17:42  _____________________________________________________________________  External Attachment:    Type:   Image     Comment:   External Document

## 2010-04-16 NOTE — Progress Notes (Signed)
Summary: Needs Appt. ASAP   Phone Note From Other Clinic   Caller: [facility]  [phone] Reason for Call: Schedule Patient Appt Summary of Call: Pt is having chest pain and wants the patient seen ASAP. Pt use to see Sam Corning. Initial call taken by: Karna Christmas,  March 21, 2009 10:05 AM  Follow-up for Phone Call        Left message for [facility]  to call back Darcey Nora RN, [facility]  March 21, 2009 10:22 AM     Appended Document: Needs Appt. ASAP I spoke with Loraine Leriche he will notify the patient of date and time of appointment 03-26-09 8:30 with Dr Russella Dar

## 2010-04-16 NOTE — Progress Notes (Signed)
Summary: Overnight oximetry 63min4sec with room air sat <= 88%  Phone Note Other Incoming   Summary of Call: Overnight oximetry done 01/01/10-desat less than or equal to 88% on room air 6 minutes, 4 seconds= 1.8% of night. Cut off is 5 minutes. Because of his cerebrovascular disease, it may be worth his trouble to have oxygen. Will discuss. Initial call taken by: Waymon Budge MD,  January 19, 2010 1:35 PM

## 2010-04-16 NOTE — Procedures (Signed)
Summary: EGD   EGD  Procedure date:  03/25/2005  Findings:      Location: [facility]    EGD  Procedure date:  03/25/2005  Findings:      Location: [facility]   Patient Name: [name], [name] MRN:  Procedure Procedures: Panendoscopy (EGD) CPT: 43235.  Personnel: Endoscopist: Ulyess Mort, MD.  Referred By: Graceann Congress, MD.  Exam Location: Exam performed in Outpatient Clinic. Outpatient  Patient Consent: Procedure, Alternatives, Risks and Benefits discussed, consent obtained, from patient. Consent was obtained by the RN.  Indications Symptoms: Chest Pain. Dyspepsia, Reflux symptoms  History  Current Medications: Patient is not currently taking Coumadin.  Pre-Exam Physical: Entire physical exam was normal.  Comments: Pt. history reviewed/updated, physical exam performed prior to initiation of sedation? Exam Exam Info: Maximum depth of insertion Duodenum, intended Duodenum. Patient position: on left side. Vocal cords visualized. Gastric retroflexion performed. Images taken. ASA Classification: II. Tolerance: good.  Sedation Meds: Patient assessed and found to be appropriate for moderate (conscious) sedation. Fentanyl 50 mcg. given IV. Versed 3 mg. given IV. Cetacaine Spray 2 sprays given aerosolized.  Monitoring: BP and pulse monitoring done. Oximetry used. Supplemental O2 given  Findings - Normal: Proximal Esophagus to Distal Esophagus.  - MUCOSAL ABNORMALITY: Fundus to Pyloric Sphincter. Granular mucosa. Edema present.  - Normal: Duodenal Bulb to Jejunum.   Assessment Abnormal examination, see findings above.  Events  Unplanned Intervention: No unplanned interventions were required.  Unplanned Events: There were no complications. Plans Medication(s): Continue current medications. PPI: Lansoprazole/Prevacid 30 mg QAM,   Patient Education: Patient given standard instructions for: Mucosal Abnormality.    Disposition: After procedure patient sent to recovery. After recovery patient sent home.  This report was created from the original endoscopy report, which was reviewed and signed by the above listed endoscopist.    cc: Graceann Congress, MD

## 2010-04-16 NOTE — Progress Notes (Signed)
Summary: Thyroid Results  Phone Note Outgoing Call   Summary of Call: call pt - he needs higher dose of thyroid medication.    I suggest he take thyroid medication at bedtime.  repeat TSH in 2 months Initial call taken by: D. Thomos Lemons DO,  May 15, 2009 11:51 AM  Follow-up for Phone Call        attempted to contact patient at [phone], no answer, detailed voice message left informing patient per Dr Artist Pais instructions. Message to left to call back to schedule labs Follow-up by: Glendell Docker CMA,  May 15, 2009 1:41 PM    New/Updated Medications: LEVOTHYROXINE SODIUM 50 MCG TABS (LEVOTHYROXINE SODIUM) one by mouth once daily Prescriptions: LEVOTHYROXINE SODIUM 50 MCG TABS (LEVOTHYROXINE SODIUM) one by mouth once daily  #30 x 2   Entered and Authorized by:   D. Thomos Lemons DO   Signed by:   D. Thomos Lemons DO on 05/15/2009   Method used:   [facility]  [address] [id number]       [address]       [place], [place]  [address]       Ph: [phone] or [phone]       Fax: [phone]   RxID:   [id number]

## 2010-04-16 NOTE — Progress Notes (Signed)
Summary: Plavix Refil  Phone Note Refill Request Message from:  Fax from Pharmacy on August 22, 2009 4:03 PM  Refills Requested: Medication #1:  PLAVIX 75 MG  TABS Take 1 tablet by mouth once a day   Dosage confirmed as above?Dosage Confirmed   Brand Name Necessary? No   Supply Requested: 3 months   Last Refilled: 08/03/2009  Method Requested: Electronic Next Appointment Scheduled: 08/24/2009 @ 8a Dr Artist Pais Initial call taken by: Glendell Docker CMA,  August 22, 2009 4:03 PM    Prescriptions: PLAVIX 75 MG  TABS (CLOPIDOGREL BISULFATE) Take 1 tablet by mouth once a day  #90 x 2   Entered by:   Glendell Docker CMA   Authorized by:   D. Thomos Lemons DO   Signed by:   Glendell Docker CMA on 08/22/2009   Method used:   [facility]  [address] [id number]       [address]       [place], [place]  [address]       Ph: [phone] or [phone]       Fax: [phone]   RxID:   [id number]

## 2010-04-16 NOTE — Progress Notes (Signed)
Summary: refill--levothyroxine  Phone Note Refill Request Message from:  Patient on January 17, 2010 9:51 AM  Refills Requested: Medication #1:  LEVOTHYROXINE SODIUM 100 MCG TABS one by mouth once daily   Dosage confirmed as above?Dosage Confirmed   Supply Requested: 3 months   Last Refilled: 12/26/2009 Initial call taken by: Mervin Kung CMA Duncan Dull),  January 17, 2010 9:52 AM    Prescriptions: LEVOTHYROXINE SODIUM 100 MCG TABS (LEVOTHYROXINE SODIUM) one by mouth once daily  #90 x 0   Entered by:   Mervin Kung CMA (AAMA)   Authorized by:   D. Thomos Lemons DO   Signed by:   Mervin Kung CMA (AAMA) on 01/17/2010   Method used:   [facility]        [facility]  [address] [id number]       [address]       [place], [place]  [address]       Ph: [phone] or [phone]       Fax: [phone]   RxID:   [id number]

## 2010-04-16 NOTE — Progress Notes (Signed)
Summary: Lab Results  Phone Note Outgoing Call   Summary of Call: call pt - blood test shows he needs higher dose of thyroid medication.  see new rx.  arrange repeat TSH in 2 months Initial call taken by: D. Thomos Lemons DO,  August 27, 2009 9:45 AM  Follow-up for Phone Call        attempted to contact patient at [phone], patients wife [name] asked that I contact patient on his cell phone at [phone]. Call placed to patient at [phone], no answer, detailed vocie message left for patient informing him per Dr Artist Pais instructions. He was advised to have blood work drawn the week of 8/22, and call with any questiions Follow-up by: Glendell Docker CMA,  August 29, 2009 9:48 AM    New/Updated Medications: LEVOTHYROXINE SODIUM 75 MCG TABS (LEVOTHYROXINE SODIUM) one by mouth once daily Prescriptions: LEVOTHYROXINE SODIUM 75 MCG TABS (LEVOTHYROXINE SODIUM) one by mouth once daily  #30 x 2   Entered and Authorized by:   D. Thomos Lemons DO   Signed by:   D. Thomos Lemons DO on 08/27/2009   Method used:   [facility]        [facility]  [address] [id number]       [address]       [place], [place]  [address]       Ph: [phone] or [phone]       Fax: [phone]   RxID:   [id number]

## 2010-04-16 NOTE — Assessment & Plan Note (Signed)
Summary: rov 1 yr ///kp   Primary [name]/Referring Chrishon Martino:  Dondra Spry DO  CC:  Yearly follow up visit-sleep apnea. Had to get new CPAP; has concerns..  History of Present Illness:  06/19/08- OSA CPAP is not comfortable at 65- he feels many nights that he is gagging on machine. He denies actual choke or strangle that might suggest reflux. Likes nhis current full face mask. Denies nasal congestion, pollen problems.  December 18, 2008- OSA Treated by Dr Artist Pais for sinusitis but he blames exposures as he got ready to build a house and was outdoors. He didn't think it was the CPAP. Has had more frequent problems like this in the Fall the last few years. He "struggles" with cpap, but says it works for him and he is getting more sleep. His dogs get him up a couple of times a night. This CPAP is comfortable at 65. He declines consideration of sleep med.  December 17, 2009- OSA Recent medical notes reviewed- dx'd w/ BPV. He still feels unsteady after trying positioning therapy. He suspects BP is too low and will discuss w/ Dr Artist Pais. He stopped using CPAP a year ago.  He gave up- couldn't get comfortable with it. He tried autopap which was more comfortble, but never could tell how it was benefiting him. He says he is not being told he snores much. Denies daytime sleepiness. He stays busy- was building a house.     Preventive Screening-Counseling & Management  Alcohol-Tobacco     Smoking Status: never  Current Medications (verified): 1)  Aspirin Low Dose 81 Mg Tabs (Aspirin) .... Take 1 Tablet By Mouth Once A Day 2)  Allopurinol 100 Mg  Tabs (Allopurinol) .... Take 2 By Mouth Once Daily 3)  Plavix 75 Mg  Tabs (Clopidogrel Bisulfate) .... Take 1 Tablet By Mouth Once A Day 4)  Crestor 40 Mg Tabs (Rosuvastatin Calcium) .... Take 1 Tablet By Mouth Once A Day 5)  Protonix 40 Mg  Tbec (Pantoprazole Sodium) .... Take 1 By Mouth Qd 6)  Furosemide 40 Mg  Tabs (Furosemide) .... Take 1 By Mouth Every  Other Day 7)  Metformin Hcl 500 Mg Tabs (Metformin Hcl) .... One By Mouth Two Times A Day 8)  Nitrostat 0.4 Mg  Subl (Nitroglycerin) .Marland Kitchen.. 1 Tablet Under Tongue  Every 5 Minutes As Needed Up To 3 Doses 9)  Tramadol Hcl 50 Mg Tabs (Tramadol Hcl) .... Take 1 Tablet By Mouth Two Times A Day 10)  Cpap 11 [facility] 11)  Metoprolol Succinate 25 Mg  Xr24h-Tab (Metoprolol Succinate) .... One By Mouth Once Daily 12)  Tizanidine Hcl 2 Mg  Tabs (Tizanidine Hcl) .... Take 1 Tab By Mouth At Bedtime 13)  Levothyroxine Sodium 100 Mcg Tabs (Levothyroxine Sodium) .... One By Mouth Once Daily 14)  Benazepril Hcl 10 Mg Tabs (Benazepril Hcl) .... Take 1 Tablet By Mouth Once A Day 15)  Tramadol Hcl 100 Mg Xr24h-Tab (Tramadol Hcl) .... One By Mouth Once Daily in Am  Allergies (verified): No Known Drug Allergies  Past History:  Past Medical History: Last updated: 08/24/2009 GERD Gout  Hyperlipidemia     Hypertension   CVA- left hemiparesis   Chronic left sided pain- [place]  DM II borderline    Coronary Artery Disease  --s/p CABG  --s/p DES to LCX January 2011 Obstructive sleep apnea on CPAP  Past Surgical History: Last updated: 05/28/2009 Coronary artery bypass graft, stent  EGD (03/25/2005)   nasal  septoplasty         03/2009 - Percutaneous stenting using a drug-eluting platform of the     circumflex coronary artery with a 3.0 x 18 [employer] Promus      drug-eluting platform post-dilated to 3.75 with a noncompliant     balloon.  Family History: Last updated: 08/27/2009 Father- died lung cancer Mother- died ministrokes           Social History: Last updated: 2009/08/27 Patient never smoked.  Married  Retired  -- Physicist, medical Alcohol Use - no  Regular Exercise - no Drug Use - no   Risk Factors: Exercise: no (05/04/2009)  Risk Factors: Smoking Status: never (12/17/2009)  Social History: Smoking Status:  never  Review of Systems      See HPI  The  patient denies anorexia, fever, weight loss, weight gain, vision loss, decreased hearing, hoarseness, chest pain, syncope, dyspnea on exertion, peripheral edema, prolonged cough, headaches, hemoptysis, abdominal pain, melena, severe indigestion/heartburn, muscle weakness, enlarged lymph nodes, and angioedema.    Vital Signs:  Patient profile:   65 year old male Height:      75 inches Weight:      252.38 pounds BMI:     31.66 O2 Sat:      95 % on Room air Pulse rate:   56 / minute BP sitting:   118 / 72  (right arm) Cuff size:   large  Vitals Entered By: Reynaldo Minium CMA (December 17, 2009 9:03 AM)  O2 Flow:  Room air CC: Yearly follow up visit-sleep apnea. Had to get new CPAP; has concerns.   Physical Exam  Additional Exam:  General: A/Ox3; pleasant and cooperative, NAD, talkative, cheerful SKIN: no rash, lesions NODES: no lymphadenopathy HEENT: [place]/AT, EOM- WNL, Conjuctivae- clear, PERRLA, TM-WNL, Nose- clear, Throat- clear and wnl, Mallampati 111-IV NECK: Supple w/ fair ROM, JVD- none, normal carotid impulses w/o bruits Thyroid-  CHEST: Clear to P&A HEART: RRR, no m/g/r heard ABDOMEN: Soft  TDD:UKGU, nl pulses, no edema  NEURO: Left hemispastic with good alertness, speech, and cognition      Impression & Recommendations:  Problem # 1:  OBSTRUCTIVE SLEEP APNEA (ICD-327.23)  He has abandoned CPAP. We discussed symptoms. I will recheck his overnight oximetry as a guide to whether we should recheck a sleep study. He denies daytime sleepiness or reports of snoring.  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 Obviously he is not having eustachian tube pressure problems from the CPAP.  Other Orders: Est. Patient Level III ([address]270) DME Referral (DME)  Patient Instructions: 1)  Please schedule a follow-up appointment in 1 month. 2)  See [facility] to set up ovenight oximetry on room air.

## 2010-04-16 NOTE — Progress Notes (Signed)
Summary: wants to see cardiologist  Phone Note Call from Patient Call back at Work Phone [phone]   Caller: Patient Summary of Call: Pt would like [place] to schedule an appt with a cardiologist, still having problems Initial call taken by: Lannette Donath,  January 16, 2010 9:25 AM  Follow-up for Phone Call        call returned to patient [phone]. He states he is still having vertigo, and weakness. He would like to know what Dr Artist Pais advises Follow-up by: Glendell Docker CMA,  January 16, 2010 9:30 AM  Additional Follow-up for Phone Call Additional follow up Details #1::        see cardiologist  if cardiac w/u neg, schedule OV Additional Follow-up by: D. Thomos Lemons DO,  January 16, 2010 12:08 PM    Additional Follow-up for Phone Call Additional follow up Details #2::    call returned to patient, he has been advised per Dr Artist Pais instructions,and  he states that he has been advised of his cardiology appointment. Follow-up by: Glendell Docker CMA,  January 16, 2010 1:23 PM

## 2010-04-16 NOTE — Progress Notes (Signed)
Summary: Lab Results  Phone Note Outgoing Call   Summary of Call: call pt - blood test shows pt still needs higher dose of thyroid medication.  see new rx.   repeat TSH should be scheduled in 2 months Initial call taken by: D. Thomos Lemons DO,  November 26, 2009 4:57 PM  Follow-up for Phone Call        call placed to patient at [phone], he has been advised per Dr Artist Pais instructions.  Follow-up by: Glendell Docker CMA,  November 27, 2009 8:28 AM    New/Updated Medications: LEVOTHYROXINE SODIUM 100 MCG TABS (LEVOTHYROXINE SODIUM) one by mouth once daily Prescriptions: LEVOTHYROXINE SODIUM 100 MCG TABS (LEVOTHYROXINE SODIUM) one by mouth once daily  #30 x 2   Entered and Authorized by:   D. Thomos Lemons DO   Signed by:   D. Thomos Lemons DO on 11/26/2009   Method used:   [facility]        [facility]  [address] [id number]       [address]       [place], [place]  [address]       Ph: [phone] or [phone]       Fax: [phone]   RxID:   [id number]

## 2010-04-16 NOTE — Letter (Signed)
Summary: Appointment - Missed  [facility]     [place], [place]    Phone:   Fax:      May 07, 2009 MRN: [id number]   [name] [address] [place], [place]  [address]   Dear Mr. Kravitz,  Our records indicate you missed your appointment on 05-07-2009  with  Dr. Gala Romney    It is very important that we reach you to reschedule this appointment. We look forward to participating in your health care needs. Please contact us at the number listed above at your earliest convenience to reschedule this appointment.     Sincerely,     Lorne Skeens  [facility] Scheduling Team

## 2010-04-16 NOTE — Assessment & Plan Note (Signed)
Summary: NP6/ CAD NATIVE VESSEL PT HAS MEDICARE, SECON- [employer]/ GD  Medications Added ASPIRIN EC 325 MG TBEC (ASPIRIN) Take one tablet by mouth daily        Primary Provider:  Dondra Spry DO   History of Present Illness: Pleasant 65 year old [name] followed by Dr. Gala Romney with a history of coronary artery disease status post bypass surgery in 2004 and Taxus drug-eluting stent to the circumflex in 2005.  He had a heart catheterization in January 2011 showing a patent LIMA to the LAD, with chronic total occlusion of SVG - OM, and interval occlusion of SVG - RCA. Flow down native RCA ok. Underwent PCI with DES of native LCX. EF was normal.  LV function normal. Remainder of his medical history is notable for chronic chest pain, hypertension, hyperlipidemia, previous stroke with chronic pain and left-sided weakness and numbness, diabetes, obstructive sleep apnea on CPAP. Last seen by Dr. Gala Romney in March of 2011. Since then, he has complained of dizziness. Dr. Artist Pais ordered an MRI which was performed in October of 2011 and revealed  no acute intracranial abnormality; moderate to severe chronic ischemic disease, predominately small vessel.  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 Patient is also being treated for sinusitis. He describes dizziness with standing relieved with lying. There is no associated chest pain, dyspnea or palpitations. He also describes increased numbness in the left side of his face. He has had this since his stroke since 1996 but worsened over the last 2 months.  Current Medications (verified): 1)  Aspirin Ec 325 Mg Tbec (Aspirin) .... Take One Tablet By Mouth Daily 2)  Allopurinol 100 Mg  Tabs (Allopurinol) .... Take 2 By Mouth Once Daily 3)  Plavix [name] Mg  Tabs (Clopidogrel Bisulfate) .... Take 1 Tablet By Mouth Once A Day 4)  Crestor 40 Mg Tabs (Rosuvastatin Calcium) .... Take 1 Tablet By Mouth Once A Day 5)  Protonix 40 Mg  Tbec  (Pantoprazole Sodium) .... Take 1 By Mouth Qd 6)  Furosemide 40 Mg  Tabs (Furosemide) .... Take 1 By Mouth Every Other Day 7)  Nitrostat 0.4 Mg  Subl (Nitroglycerin) .Marland Kitchen.. 1 Tablet Under Tongue  Every 5 Minutes As Needed Up To 3 Doses 8)  Tramadol Hcl 50 Mg Tabs (Tramadol Hcl) .... Take 1 Tablet By Mouth Two Times A Day 9)  Metoprolol Succinate 25 Mg  Xr24h-Tab (Metoprolol Succinate) .... One By Mouth Once Daily 10)  Tizanidine Hcl 2 Mg  Tabs (Tizanidine Hcl) .... Take 1 Tab By Mouth At Bedtime 11)  Levothyroxine Sodium 100 Mcg Tabs (Levothyroxine Sodium) .... One By Mouth Once Daily 12)  Benazepril Hcl 5 Mg Tabs (Benazepril Hcl) .... Take 1 Tablet By Mouth Once A Day. 13)  Tramadol Hcl 100 Mg Xr24h-Tab (Tramadol Hcl) .... One By Mouth Once Daily in Am  Allergies: No Known Drug Allergies  Past History:  Past Medical History: Reviewed history from 12/28/2009 and no changes required. GERD Gout  Hyperlipidemia     Hypertension   CVA- left hemiparesis   Chronic left sided pain- Kirsteins  DM II borderline    Coronary Artery Disease  --s/p CABG  --s/p DES to LCX January 2011 Obstructive sleep apnea on CPAP   Past Surgical History: Reviewed history from 12/28/2009 and no changes required. Coronary artery bypass graft, stent  EGD (03/25/2005)    nasal septoplasty         03/2009 - Percutaneous stenting using a  drug-eluting platform of the     circumflex coronary artery with a 3.0 x 18 [employer] Promus      drug-eluting platform post-dilated to 3.[name] with a noncompliant     balloon.  Social History: Reviewed history from 12/28/2009 and no changes required. Patient never smoked.  Married  Retired  -- Physicist, medical Alcohol Use - no   Regular Exercise - no Drug Use - no   Review of Systems       Numbness in the left side of his face and dizziness but no fevers or chills, productive cough, hemoptysis, dysphasia, odynophagia, melena, hematochezia, dysuria, hematuria, rash,  seizure activity, orthopnea, PND, pedal edema, claudication. Remaining systems are negative.   Vital Signs:  Patient profile:   65 year old [name] Height:      [name] inches Weight:      249 pounds BMI:     31.24 Pulse rate:   54 / minute Pulse (ortho):   60 / minute Resp:     18 per minute BP sitting:   128 / 80  (left arm) BP standing:   120 / [name]  Vitals Entered By: Kem Parkinson (January 17, 2010 11:25 AM)  Serial Vital Signs/Assessments:  Time      Position  BP       Pulse  Resp  Temp     By           Lying RA  134/82   55                    Kimalexis Barnes           Sitting   130/77   53                    Kimalexis Barnes           Standing  120/[name]   60                    Kimalexis Barnes   Physical Exam  General:  Well-developed well-nourished in no acute distress.  Skin is warm and dry.  HEENT is normal.  Neck is supple. No thyromegaly.  Chest is clear to auscultation with normal expansion.  Cardiovascular exam is regular rate and rhythm.  Abdominal exam nontender or distended. No masses palpated. Extremities show no edema. neuro grossly intact    EKG  Procedure date:  01/17/2010  Findings:      Sinus bradycardia at a rate of 54. Left ventricular hypertrophy. Left axis deviation. No ST changes.  Impression & Recommendations:  Problem # 1:  DIZZINESS (ICD-780.4) Issue appears to be chronic; worse recently; sound orthostatic but not orthostatic in the office. Changes lasix to 40 mg by mouth daily as needed. F/U with Dr. Artist Pais sinusitis and vertigo.  Problem # 2:  CAD, NATIVE VESSEL (ICD-414.01)  Continue aspirin, Plavix, beta blocker and statin. His updated medication list for this problem includes:    Aspirin Ec 325 Mg Tbec (Aspirin) .Marland Kitchen... Take one tablet by mouth daily    Plavix [name] Mg Tabs (Clopidogrel bisulfate) .Marland Kitchen... Take 1 tablet by mouth once a day    Nitrostat 0.4 Mg Subl (Nitroglycerin) .Marland Kitchen... 1 tablet under tongue  every 5 minutes as needed up to 3  doses    Metoprolol Succinate 25 Mg Xr24h-tab (Metoprolol succinate) ..... One by mouth once daily    Benazepril Hcl 5 Mg Tabs (Benazepril hcl) .Marland Kitchen... Take 1 tablet  by mouth once a day.  His updated medication list for this problem includes:    Aspirin Ec 325 Mg Tbec (Aspirin) .Marland Kitchen... Take one tablet by mouth daily    Plavix [name] Mg Tabs (Clopidogrel bisulfate) .Marland Kitchen... Take 1 tablet by mouth once a day    Nitrostat 0.4 Mg Subl (Nitroglycerin) .Marland Kitchen... 1 tablet under tongue  every 5 minutes as needed up to 3 doses    Metoprolol Succinate 25 Mg Xr24h-tab (Metoprolol succinate) ..... One by mouth once daily    Benazepril Hcl 5 Mg Tabs (Benazepril hcl) .Marland Kitchen... Take 1 tablet by mouth once a day.  Problem # 3:  HYPERTENSION (ICD-401.9)  Blood pressure controlled on present medications. Will continue. His updated medication list for this problem includes:    Aspirin Ec 325 Mg Tbec (Aspirin) .Marland Kitchen... Take one tablet by mouth daily    Furosemide 40 Mg Tabs (Furosemide) .Marland Kitchen... Take 1 by mouth every other day    Metoprolol Succinate 25 Mg Xr24h-tab (Metoprolol succinate) ..... One by mouth once daily    Benazepril Hcl 5 Mg Tabs (Benazepril hcl) .Marland Kitchen... Take 1 tablet by mouth once a day.  His updated medication list for this problem includes:    Aspirin Ec 325 Mg Tbec (Aspirin) .Marland Kitchen... Take one tablet by mouth daily    Furosemide 40 Mg Tabs (Furosemide) .Marland Kitchen... Take 1 by mouth every other day    Metoprolol Succinate 25 Mg Xr24h-tab (Metoprolol succinate) ..... One by mouth once daily    Benazepril Hcl 5 Mg Tabs (Benazepril hcl) .Marland Kitchen... Take 1 tablet by mouth once a day.  Problem # 4:  DIABETES MELLITUS, TYPE II, BORDERLINE (ICD-790.29)  Problem # 5:  HYPERLIPIDEMIA (ICD-272.4)  Continue statin. His updated medication list for this problem includes:    Crestor 40 Mg Tabs (Rosuvastatin calcium) .Marland Kitchen... Take 1 tablet by mouth once a day  His updated medication list for this problem includes:    Crestor 40 Mg Tabs  (Rosuvastatin calcium) .Marland Kitchen... Take 1 tablet by mouth once a day  Problem # 6:  HYPOTHYROIDISM (ICD-244.9)  His updated medication list for this problem includes:    Levothyroxine Sodium 100 Mcg Tabs (Levothyroxine sodium) ..... One by mouth once daily  His updated medication list for this problem includes:    Levothyroxine Sodium 100 Mcg Tabs (Levothyroxine sodium) ..... One by mouth once daily  Other Orders: EKG w/ Interpretation (93000)  Patient Instructions: 1)  Your physician recommends that you schedule a follow-up appointment in: 6-8 WEEKS WITH DR BENSIMHON 2)  Your physician has recommended you make the following change in your medication: TAKE FUROSEMIDE 40 MG AS NEEDED

## 2010-04-16 NOTE — Progress Notes (Signed)
Summary: refills--furosemide, pantoprazole  Phone Note Refill Request Message from:  Fax from [facility] [address] on September 14, 2009 1:52 PM  Refills Requested: Medication #1:  FUROSEMIDE 40 MG  TABS take 1 by mouth every other day   Dosage confirmed as above?Dosage Confirmed   Supply Requested: 3 months   Last Refilled: 08/27/2008  Medication #2:  PROTONIX 40 MG  TBEC take 1 by mouth qd   Dosage confirmed as above?Dosage Confirmed   Supply Requested: 3 months   Last Refilled: 09/07/2009 Next Appointment Scheduled: 12/2009--Dr. Artist Pais Initial call taken by: Mervin Kung CMA (AAMA),  September 14, 2009 5:00 PM    Prescriptions: FUROSEMIDE 40 MG  TABS (FUROSEMIDE) take 1 by mouth every other day  #45 x 0   Entered by:   Mervin Kung CMA (AAMA)   Authorized by:   D. Thomos Lemons DO   Signed by:   Mervin Kung CMA (AAMA) on 09/14/2009   Method used:   [facility]        [facility] [id number]       [address]       [place], [place]  [address]       Ph: [phone] or [phone]       Fax: [phone]   RxID:   [id number] PROTONIX 40 MG  TBEC (PANTOPRAZOLE SODIUM) take 1 by mouth qd  #90 Tablet x 0   Entered by:   Mervin Kung CMA (AAMA)   Authorized by:   D. Thomos Lemons DO   Signed by:   Mervin Kung CMA (AAMA) on 09/14/2009   Method used:   [facility]        [facility]  [id number]       [address]       [place], [place]  [address]       Ph: [phone] or [phone]       Fax: [phone]   RxID:   [id number]

## 2010-04-16 NOTE — Assessment & Plan Note (Signed)
Summary: 1 week follow up/mhf   Vital Signs:  Patient profile:   65 year old male Weight:      247 pounds BMI:     30.98 O2 Sat:      99 % on Room air Temp:     97.8 degrees F oral Pulse rate:   66 / minute Pulse rhythm:   regular BP sitting:   110 / 70  (left arm) Cuff size:   large  Vitals Entered By: Glendell Docker CMA (April 27, 2009 11:53 AM)  O2 Flow:  Room air  Primary Care Provider:  D. Thomos Lemons DO  CC:  1 Week Follow up.  History of Present Illness: 1 Week follow up   65 y/o white male for f/u re:   left foot cellulitus. redness much better. no tenderness or pain. he has chronic abnl gait due to hx of CVA due to abnl gait, he has chronic callus of toes and foot  Allergies (verified): No Known Drug Allergies  Past History:  Past Medical History: GERD Gout  Hyperlipidemia     Hypertension  CVA- left hemiparesis   Chronic left sided pain- Kirsteins DM II borderline    Coronary Artery Disease Obstructive sleep apnea on CPAP  Past Surgical History: Coronary artery bypass graft, stent  EGD (03/25/2005)  nasal septoplasty         03/2009 - Percutaneous stenting using a drug-eluting platform of the     circumflex coronary artery with a 3.0 x 18 [employer] Promus      drug-eluting platform post-dilated to 3.75 with a noncompliant     balloon.  Family History: Father- died lung cancer Mother- died ministrokes         Social History: Patient never smoked.  Married   Retired          Physical Exam  General:  alert, well-developed, and well-nourished.   Lungs:  normal respiratory effort and normal breath sounds.   Heart:  normal rate, regular rhythm, and no gallop.   Skin:  left foot redness resolved.   Impression & Recommendations:  Problem # 1:  CELLULITIS, FOOT, LEFT (ICD-682.7) Assessment Improved left foot cellulitus resolved.  pt advised to f/u podiatrist.  I suggest pt get fitted with special shoes or other ankle brace to  minimize callus formation.  Pt to have cephalexin on hand in case cellulitus recurrs.  His updated medication list for this problem includes:    Cephalexin 500 Mg Caps (Cephalexin) .Marland Kitchen... 2 caps by mouth two times a day  Complete Medication List: 1)  Bufferin 325 Mg Tabs (Aspirin buf(cacarb-mgcarb-mgo)) .... Take 1 tablet by mouth once a day 2)  Allopurinol 100 Mg Tabs (Allopurinol) .... Take 2 by mouth once daily 3)  Plavix 75 Mg Tabs (Clopidogrel bisulfate) .... Take 1 tablet by mouth once a day 4)  Crestor 40 Mg Tabs (Rosuvastatin calcium) .... Take 1 tablet by mouth once a day 5)  Protonix 40 Mg Tbec (Pantoprazole sodium) .... Take 1 by mouth qd 6)  Furosemide 40 Mg Tabs (Furosemide) .... Take 1 by mouth qd 7)  Metformin Hcl 850 Mg Tabs (Metformin hcl) .... Take 1 tablet by mouth two times a day 8)  Nitrostat 0.4 Mg Subl (Nitroglycerin) .Marland Kitchen.. 1 tablet under tongue  every 5 minutes as needed up to 3 doses 9)  Tramadol Hcl 50 Mg Tabs (Tramadol hcl) .... Take 1 tablet by mouth once a day 10)  Cpap 14 [facility]  11)  Metoprolol Succinate 25 Mg Xr24h-tab (Metoprolol succinate) .... One by mouth once daily 12)  Tizanidine Hcl 2 Mg Tabs (Tizanidine hcl) .... Take 1 tab by mouth at bedtime 13)  Benazepril Hcl 5 Mg Tabs (Benazepril hcl) .... One by mouth once daily 14)  Levothyroxine Sodium 25 Mcg Tabs (Levothyroxine sodium) .... One by mouth once daily 15)  Cephalexin 500 Mg Caps (Cephalexin) .... 2 caps by mouth two times a day 16)  Cephalexin 500 Mg Caps (Cephalexin) .... 2 caps by mouth two times a day  Patient Instructions: 1)  Please schedule a follow-up appointment in 4 months. Prescriptions: CEPHALEXIN 500 MG CAPS (CEPHALEXIN) 2 caps by mouth two times a day  #21 x 0   Entered and Authorized by:   D. Thomos Lemons DO   Signed by:   D. Thomos Lemons DO on 04/27/2009   Method used:   Print then Give to Patient   RxID:   [id number]   Current Allergies (reviewed  today): No known allergies

## 2010-04-16 NOTE — Progress Notes (Signed)
Summary: Metoprolol Refill  Phone Note Refill Request Message from:  Fax from Pharmacy on December 11, 2009 9:56 AM  Refills Requested: Medication #1:  METOPROLOL SUCCINATE 25 MG  XR24H-TAB one by mouth once daily   Dosage confirmed as above?Dosage Confirmed   Brand Name Necessary? No   Supply Requested: 1 month   Last Refilled: 09/14/2009  Method Requested: Electronic Next Appointment Scheduled: 12-17-09 Dr young Initial call taken by: Roselle Locus,  December 11, 2009 9:57 AM  Follow-up for Phone Call        Rx sent to pharmacy Follow-up by: Glendell Docker CMA,  December 11, 2009 10:21 AM    Prescriptions: METOPROLOL SUCCINATE 25 MG  XR24H-TAB (METOPROLOL SUCCINATE) one by mouth once daily  #90 x 1   Entered by:   Glendell Docker CMA   Authorized by:   D. Thomos Lemons DO   Signed by:   Glendell Docker CMA on 12/11/2009   Method used:   [facility]  [address] [id number]       [address]       [place], [place]  [address]       Ph: [phone] or [phone]       Fax: [phone]   RxID:   [id number]

## 2010-04-16 NOTE — Miscellaneous (Signed)
Summary: Flu/Prevo Drugs  Flu/[facility]   Imported By: Lanelle Bal 01/07/2010 10:41:23  _____________________________________________________________________  External Attachment:    Type:   Image     Comment:   External Document

## 2010-04-16 NOTE — Procedures (Signed)
Summary: Pulse Oximetry/IDS  Pulse Oximetry/IDS   Imported By: Sherian Rein 01/26/2010 11:32:40  _____________________________________________________________________  External Attachment:    Type:   Image     Comment:   External Document

## 2010-04-16 NOTE — Progress Notes (Signed)
Summary: 30  DAY RX--crestor,levothyroid  Phone Note Refill Request Message from:  Patient on June 20, 2009 9:01 AM  Refills Requested: Medication #1:  CRESTOR 40 MG TABS Take 1 tablet by mouth once a day   Dosage confirmed as above?Dosage Confirmed   Brand Name Necessary? No   Supply Requested: 3 months  Medication #2:  LLEVOTHYROXINE 50 MG   Dosage confirmed as above?Dosage Confirmed   Brand Name Necessary? No   Supply Requested: 3 months WANTS 90 DAY REFILL AT [facility]    Method Requested: Electronic Next Appointment Scheduled: 08-24-09 8 DR Aliany Fiorenza  Initial call taken by: Roselle Locus,  June 20, 2009 9:02 AM    Prescriptions: LEVOTHYROXINE SODIUM 50 MCG TABS (LEVOTHYROXINE SODIUM) one by mouth once daily  #90 x 0   Entered by:   Mervin Kung CMA   Authorized by:   D. Thomos Lemons DO   Signed by:   Mervin Kung CMA on 06/20/2009   Method used:   [facility] to        [facility]  [address] [id number]       [address]       [place], [place]  [address]       Ph: [phone] or [phone]       Fax: [phone]   RxID:   [id number] CRESTOR 40 MG TABS (ROSUVASTATIN CALCIUM) Take 1 tablet by mouth once a day  #90 x 0   Entered by:   Mervin Kung CMA   Authorized by:   D. Thomos Lemons DO   Signed by:   Mervin Kung CMA on 06/20/2009   Method used:   [facility]ically to        [facility]  [id number]       [address]       [place], [place]  [address]       Ph: [phone] or [phone]       Fax: [phone]   RxID:   [id number]

## 2010-04-16 NOTE — Progress Notes (Signed)
Summary: CPAP change to 11 based on download  Phone Note Other Incoming   Summary of Call: [facility]- Download CPAP 11/ AHI 4.6. Good compliance and control. Change to 11.    New/Updated Medications: * CPAP 11 CWP [facility]

## 2010-04-16 NOTE — Miscellaneous (Signed)
Summary: CPAP issues/[facility]  CPAP issues/[facility]   Imported By: Sherian Rein 09/28/2009 14:11:22  _____________________________________________________________________  External Attachment:    Type:   Image     Comment:   External Document  Appended Document: CPAP issues/[facility] Noncompliant with CPAP

## 2010-04-16 NOTE — Assessment & Plan Note (Signed)
Summary: cellulitis?/mhf   Vital Signs:  Patient profile:   65 year old male Weight:      248 pounds BMI:     31.11 O2 Sat:      100 % on Room air Temp:     97.4 degrees F oral Pulse rate:   62 / minute Pulse rhythm:   regular Resp:     18 per minute BP sitting:   120 / 80  (right arm) Cuff size:   large  Vitals Entered By: Glendell Docker CMA (April 20, 2009 9:55 AM)  O2 Flow:  Room air  Primary Care Provider:  D. Thomos Lemons DO  CC:  left leg pain.  History of Present Illness: 65 y/o  c/o left leg pain, hot over the past 2 days. podiatrist worked on left great toe.  first foot  got red and warm. now spreading up the leg.  no chills or fever.   Allergies (verified): No Known Drug Allergies  Past History:  Past Medical History: GERD Gout  Hyperlipidemia    Hypertension  CVA- left hemiparesis  Chronic left sided pain- Kirsteins DM II borderline    Coronary Artery Disease Obstructive sleep apnea on CPAP  Past Surgical History: Coronary artery bypass graft, stent  EGD (03/25/2005)  nasal septoplasty       03/2009 - Percutaneous stenting using a drug-eluting platform of the     circumflex coronary artery with a 3.0 x 18 [employer] Promus      drug-eluting platform post-dilated to 3.75 with a noncompliant     balloon.  Family History: Father- died lung cancer Mother- died ministrokes       Social History: Patient never smoked.  Married  Retired         Physical Exam  General:  alert, well-developed, and well-nourished.   Lungs:  normal respiratory effort and normal breath sounds.   Heart:  normal rate, regular rhythm, and no gallop.   Skin:  left foot edema with redness up to mid left lower leg.  no tenderness   Impression & Recommendations:  Problem # 1:  CELLULITIS, FOOT, LEFT (ICD-682.7) Podiatrist worked on left toe.  since then left foot redness spreading to leg.  take abx as directed. Patient advised to call office if symptoms persist  or worsen.  His updated medication list for this problem includes:    Cephalexin 500 Mg Caps (Cephalexin) .Marland Kitchen... 2 caps by mouth two times a day  Complete Medication List: 1)  Bufferin 325 Mg Tabs (Aspirin buf(cacarb-mgcarb-mgo)) .... Take 1 tablet by mouth once a day 2)  Allopurinol 100 Mg Tabs (Allopurinol) .... Take 2 by mouth once daily 3)  Plavix 75 Mg Tabs (Clopidogrel bisulfate) .... Take 1 tablet by mouth once a day 4)  Crestor 40 Mg Tabs (Rosuvastatin calcium) .... Take 1 tablet by mouth once a day 5)  Protonix 40 Mg Tbec (Pantoprazole sodium) .... Take 1 by mouth qd 6)  Furosemide 40 Mg Tabs (Furosemide) .... Take 1 by mouth qd 7)  Metformin Hcl 850 Mg Tabs (Metformin hcl) .... Take 1 tablet by mouth two times a day 8)  Nitrostat 0.4 Mg Subl (Nitroglycerin) .Marland Kitchen.. 1 tablet under tongue  every 5 minutes as needed up to 3 doses 9)  Tramadol Hcl 50 Mg Tabs (Tramadol hcl) .... Take 1 tablet by mouth once a day 10)  Cpap 14 [facility]  11)  Metoprolol Succinate 25 Mg Xr24h-tab (Metoprolol succinate) .... One by  mouth once daily 12)  Tizanidine Hcl 2 Mg Tabs (Tizanidine hcl) .... Take 1 tab by mouth at bedtime 13)  Benazepril Hcl 5 Mg Tabs (Benazepril hcl) .... One by mouth once daily 14)  Levothyroxine Sodium 25 Mcg Tabs (Levothyroxine sodium) .... One by mouth once daily 15)  Cephalexin 500 Mg Caps (Cephalexin) .... 2 caps by mouth two times a day  Patient Instructions: 1)  Call our office if your symptoms do not  improve or gets worse. 2)  Please schedule a follow-up appointment in 1 week. Prescriptions: CEPHALEXIN 500 MG CAPS (CEPHALEXIN) 2 caps by mouth two times a day  #40 x 0   Entered and Authorized by:   D. Thomos Lemons DO   Signed by:   D. Thomos Lemons DO on 04/20/2009   Method used:   [facility] to        [facility]  [address] [id number]       [address]       [place], [place]  [address]       Ph: [phone] or [phone]        Fax: [phone]   RxID:   [id number]   Current Allergies (reviewed today): No known allergies

## 2010-04-16 NOTE — Procedures (Signed)
Summary: Colonoscopy   Colonoscopy  Procedure date:  03/25/2005  Findings:      Results: Normal. Location:  [facility].    Procedures Next Due Date:    Colonoscopy: 03/2010  Colonoscopy  Procedure date:  03/25/2005  Findings:      Results: Normal. Location:   Endoscopy Center.    Procedures Next Due Date:    Colonoscopy: 03/2010 Patient Name: [name], [name] MRN:  Procedure Procedures: Colonoscopy CPT: [address].  Personnel: Endoscopist: Ulyess Mort, MD.  Referred By: Graceann Congress, MD.  Exam Location: Exam performed in Outpatient Clinic. Outpatient  Patient Consent: Procedure, Alternatives, Risks and Benefits discussed, consent obtained, from patient. Consent was obtained by the RN.  Indications  Average Risk Screening Routine.  History  Current Medications: Patient is not currently taking Coumadin.  Pre-Exam Physical: Entire physical exam was normal.  Comments: Pt. history reviewed/updated, physical exam performed prior to initiation of sedation? Exam Exam: Extent of exam reached: Ileum, extent intended: Cecum.  The cecum was identified by appendiceal orifice and IC valve. Colon retroflexion performed. Images taken. ASA Classification: II. Tolerance: good.  Monitoring: Pulse and BP monitoring, Oximetry used. Supplemental O2 given.  Colon Prep Prep results: good.  Sedation Meds: Patient assessed and found to be appropriate for moderate (conscious) sedation.  Findings - NORMAL EXAM: Cecum to Rectum. Not Seen: Polyps. AVM's. Colitis. Tumors. Melanosis. Crohn's. Diverticulosis. Hemorrhoids.   Assessment Normal examination.  Events  Unplanned Interventions: No intervention was required.  Unplanned Events: There were no complications. Plans Medication Plan: Continue current medications.  Patient Education: Patient given standard instructions for: a normal exam. Yearly hemoccult testing recommended. Patient  instructed to get routine colonoscopy every 5 years.  Disposition: After procedure patient sent to recovery. After recovery patient sent home.  This report was created from the original endoscopy report, which was reviewed and signed by the above listed endoscopist.    cc: Graceann Congress, MD

## 2010-04-16 NOTE — Assessment & Plan Note (Signed)
Summary: still has vertigo, thinks meds reason/dt   Vital Signs:  Patient profile:   65 year old male Height:      75 inches Weight:      251.25 pounds BMI:     31.52 O2 Sat:      97 % on Room air Temp:     98.1 degrees F oral Pulse rate:   62 / minute Pulse rhythm:   regular Resp:     20 per minute BP sitting:   120 / 60  (right arm) BP standing:   140 / 70  (right arm) Cuff size:   large  Vitals Entered By: Glendell Docker CMA (December 28, 2009 9:31 AM)  O2 Flow:  Room air CC: Dizzy Is Patient Diabetic? No Pain Assessment Patient in pain? no      Comments c/o inner ear discomfort, and feels off balance. States he was seen at  [facility] and did the exercises with no improvement. He states he still feels off balance. He was seen by Dr Maple Hudson and was advised to see ENT,but has not yet   Primary Care Loys Shugars:  D. Thomos Lemons DO  CC:  Dizzy.  History of Present Illness: 65 y/o white male c/o dizziness not getting better he tried vestibular rehab - did not help symptoms worse first thing in AM when he gets out of bed worse with leaning forward and backward no headache no nausea or vomiting no changes in vision (blurry vision,  double vision)  no palpitations  Preventive Screening-Counseling & Management  Alcohol-Tobacco     Smoking Status: never  Allergies (verified): No Known Drug Allergies  Past History:  Past Medical History: GERD Gout  Hyperlipidemia     Hypertension   CVA- left hemiparesis   Chronic left sided pain- Kirsteins  DM II borderline    Coronary Artery Disease  --s/p CABG  --s/p DES to LCX January 2011 Obstructive sleep apnea on CPAP   Past Surgical History: Coronary artery bypass graft, stent  EGD (03/25/2005)    nasal septoplasty         03/2009 - Percutaneous stenting using a drug-eluting platform of the     circumflex coronary artery with a 3.0 x 18 [employer] Promus      drug-eluting platform post-dilated to 3.75 with a  noncompliant     balloon.  Family History: Father- died lung cancer Mother- died ministrokes            Social History: Patient never smoked.  Married  Retired  -- Physicist, medical Alcohol Use - no   Regular Exercise - no Drug Use - no   Review of Systems       left side seems to more numb and stiff  Physical Exam  General:  alert, well-developed, and well-nourished.   Head:  normocephalic and atraumatic.   Eyes:  pupils equal, pupils round, and pupils reactive to light.  no nystagmus Ears:  R ear normal and L ear normal.   Mouth:  pharynx pink and moist.   Neck:  No deformities, masses, or tenderness noted. Lungs:  normal respiratory effort and normal breath sounds.   Heart:  normal rate, regular rhythm, and no gallop.   Neurologic:  cranial nerves II-XII intact.  abnormal gait Psych:  normally interactive and good eye contact.     Impression & Recommendations:  Problem # 1:  DIZZINESS (ICD-780.4) mild vertigo of unclear etiology.  no improvement with vestibular rehab.  unable to fully  utilize [employer] due to neck stiffness.  rule out cerebellar lesion refer to ENT for further eval Orders: Radiology Referral (Radiology) ENT Referral (ENT)  Problem # 2:  HYPERTENSION (ICD-401.9) continue low dose benazepril  His updated medication list for this problem includes:    Furosemide 40 Mg Tabs (Furosemide) .Marland Kitchen... Take 1 by mouth every other day    Metoprolol Succinate 25 Mg Xr24h-tab (Metoprolol succinate) ..... One by mouth once daily    Benazepril Hcl 5 Mg Tabs (Benazepril hcl) .Marland Kitchen... Take 1 tablet by mouth once a day.  BP today: 120/60 Prior BP: 118/72 (12/17/2009)  Labs Reviewed: K+: 4.1 (11/23/2009) Creat: : 1.10 (11/23/2009)   Chol: 110 (04/02/2009)   HDL: 49 (04/02/2009)   LDL: 48 (04/02/2009)   TG: 67 (04/02/2009)  Complete Medication List: 1)  Aspirin Low Dose 81 Mg Tabs (Aspirin) .... Take 1 tablet by mouth once a day 2)  Allopurinol 100 Mg  Tabs (Allopurinol) .... Take 2 by mouth once daily 3)  Plavix 75 Mg Tabs (Clopidogrel bisulfate) .... Take 1 tablet by mouth once a day 4)  Crestor 40 Mg Tabs (Rosuvastatin calcium) .... Take 1 tablet by mouth once a day 5)  Protonix 40 Mg Tbec (Pantoprazole sodium) .... Take 1 by mouth qd 6)  Furosemide 40 Mg Tabs (Furosemide) .... Take 1 by mouth every other day 7)  Nitrostat 0.4 Mg Subl (Nitroglycerin) .Marland Kitchen.. 1 tablet under tongue  every 5 minutes as needed up to 3 doses 8)  Tramadol Hcl 50 Mg Tabs (Tramadol hcl) .... Take 1 tablet by mouth two times a day 9)  Metoprolol Succinate 25 Mg Xr24h-tab (Metoprolol succinate) .... One by mouth once daily 10)  Tizanidine Hcl 2 Mg Tabs (Tizanidine hcl) .... Take 1 tab by mouth at bedtime 11)  Levothyroxine Sodium 100 Mcg Tabs (Levothyroxine sodium) .... One by mouth once daily 12)  Benazepril Hcl 5 Mg Tabs (Benazepril hcl) .... Take 1 tablet by mouth once a day. 13)  Tramadol Hcl 100 Mg Xr24h-tab (Tramadol hcl) .... One by mouth once daily in am  Patient Instructions: 1)  Please schedule a follow-up appointment in 1 month.  Current Allergies (reviewed today): No known allergies    Immunization History:  Influenza Immunization History:    Influenza:  historical (12/20/2009)

## 2010-04-16 NOTE — Letter (Signed)
Summary: Generic Letter  Architectural technologist, Main Office  [address]   [place], [place] [address]   Phone: [phone]  Fax: [phone]        May 24, 2009 MRN: [id number]    [name] [address] [place], [place]  [address]    Dear [name],  Your labwork from 05/22/09 was ok.  If you have any questions please give [place] a call.     Sincerely,  Meredith Staggers, RN Arvilla Meres, MD  This letter has been electronically signed by your physician.

## 2010-04-16 NOTE — Assessment & Plan Note (Signed)
Summary: having trouble with balance/dt   Vital Signs:  Patient profile:   65 year old male Weight:      246.75 pounds BMI:     30.95 O2 Sat:      98 % on Room air Temp:     97.8 degrees F oral Pulse rate:   60 / minute Pulse rhythm:   regular Resp:     16 per minute BP sitting:   112 / 60  (right arm) BP standing:   120 / 70 Cuff size:   regular  Vitals Entered By: Glendell Docker CMA (November 23, 2009 3:04 PM)  O2 Flow:  Room air CC: "Does not feel right" Is Patient Diabetic? No Pain Assessment Patient in pain? no      Comments c/ o onset of light headed one week ago that is constant , patient states he has not eaten today , he had  only sweet tea. Requesting 90 day for Allopurinol, and  levothyroxine. questions about Cpap machine    Primary Care Provider:  D. Thomos Lemons DO  CC:  "Does not feel right".  History of Present Illness: 65 y/o white male c/o intermittent dizziness symptoms more apparent with changes in head position some nausea no syncope no palpitations or chest pain  Preventive Screening-Counseling & Management  Alcohol-Tobacco     Smoking Status: current  Allergies (verified): No Known Drug Allergies  Social History: Smoking Status:  current  Physical Exam  General:  alert, well-developed, and well-nourished.   Eyes:  pupils equal, pupils round, and pupils reactive to light.  no nystagmus Neck:  No deformities, masses, or tenderness noted. Lungs:  normal respiratory effort and normal breath sounds.   Heart:  normal rate, regular rhythm, and no gallop.   Extremities:  trace right pedal edema.   Neurologic:  cranial nerves II-XII intact.  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   refer to vestibular rehab Patient advised to call office if symptoms persist or worsen.  Orders: T-Basic Metabolic Panel [phone]) T-CBC No Diff  ([id number]) Physical Therapy Referral (PT)  Complete Medication List: 1)  Aspirin Low Dose 81 Mg Tabs (Aspirin) .... Take 1 tablet by mouth once a day 2)  Allopurinol 100 Mg Tabs (Allopurinol) .... Take 2 by mouth once daily 3)  Plavix 75 Mg Tabs (Clopidogrel bisulfate) .... Take 1 tablet by mouth once a day 4)  Crestor 40 Mg Tabs (Rosuvastatin calcium) .... Take 1 tablet by mouth once a day 5)  Protonix 40 Mg Tbec (Pantoprazole sodium) .... Take 1 by mouth qd 6)  Furosemide 40 Mg Tabs (Furosemide) .... Take 1 by mouth every other day 7)  Metformin Hcl 500 Mg Tabs (Metformin hcl) .... One by mouth two times a day 8)  Nitrostat 0.4 Mg Subl (Nitroglycerin) .Marland Kitchen.. 1 tablet under tongue  every 5 minutes as needed up to 3 doses 9)  Tramadol Hcl 50 Mg Tabs (Tramadol hcl) .... Take 1 tablet by mouth two times a day 10)  Cpap 11 [facility]  11)  Metoprolol Succinate 25 Mg Xr24h-tab (Metoprolol succinate) .... One by mouth once daily 12)  Tizanidine Hcl 2 Mg Tabs (Tizanidine hcl) .... Take 1 tab by mouth at bedtime 13)  Levothyroxine Sodium 100 Mcg Tabs (Levothyroxine sodium) .... One by mouth once daily 14)  Benazepril Hcl 10 Mg Tabs (Benazepril hcl) .... Take 1 tablet by  mouth once a day 15)  Tramadol Hcl 100 Mg Xr24h-tab (Tramadol hcl) .... One by mouth once daily in am  Other Orders: T-TSH ([id number])  Patient Instructions: 1)  Please schedule a follow-up appointment in 2 months. Prescriptions: LEVOTHYROXINE SODIUM 75 MCG TABS (LEVOTHYROXINE SODIUM) one by mouth once daily  #30 x 2   Entered and Authorized by:   D. Thomos Lemons DO   Signed by:   D. Thomos Lemons DO on 11/23/2009   Method used:   Electronically to        [facility]  [id number] (retail)       [address]       [place], [place]  [address]       Ph: [phone] or [phone]       Fax: [phone]   RxID:   [id number] ALLOPURINOL 100 MG  TABS (ALLOPURINOL) take 2 by mouth once  daily  #180 x 1   Entered and Authorized by:   D. Thomos Lemons DO   Signed by:   D. Thomos Lemons DO on 11/23/2009   Method used:   Electronically to        [facility]  [id number] (ret[facility]       [address]       [place], [place]  [address]       Ph: [phone] or [phone]       Fax: [phone]   RxID:   [id number]   Current Allergies (reviewed today): No known allergies

## 2010-04-16 NOTE — Assessment & Plan Note (Signed)
Summary: eph/ gd  Medications Added FUROSEMIDE 40 MG  TABS (FUROSEMIDE) take 1 by mouth every other day MUCINEX 600 MG XR12H-TAB (GUAIFENESIN) UAD TAMIFLU 75 MG CAPS (OSELTAMIVIR PHOSPHATE) two times a day BENAZEPRIL HCL 5 MG TABS (BENAZEPRIL HCL) take one tablet by mouth daily      Allergies Added: NKDA  Visit Type:  Follow-up Primary Provider:  Dondra Spry DO   History of Present Illness: [name] is a very pleasant 65 year old male with a history of coronary artery disease status post bypass surgery in 2004 and Taxus drug-eluting stent to the circumflex in 2005.  He had a heart catheterization in January 2011 showing a patent LIMA to the LAD, with chronic total occlusion of SVG - OM, and interval occlusion of SVG - RCA. Flow down native RCA ok. Underwent PCI with DES of native LCX. EF was normal.  Remainder of his medical history is notable for chronic chest pain, hypertension, hyperlipidemia, previous stroke with chronic pain and left-sided weakness and numbness, diabetes, obstructive sleep apnea on CPAP  Here for routine f/u.  Discharged home from hospital this past weekend after treatment for flu-like illness. Says he feels terrible today. Says BP is too low and he feels dizzy. Also notes that anytime he tries to eat gets sick to stomach and throws up. No diarrhea. No f/c. Dizziness occurs when standing and trying to walk across room.Marland Kitchen No problem when lying down. No syncope. No CP. Breathing mildly labored. No edema. Started on benazepril about a month or two ago. Taking   Was previously on lasix every other day but increased to once daily in January.  Current Medications (verified): 1)  Bufferin 325 Mg Tabs (Aspirin Buf(Cacarb-Mgcarb-Mgo)) .... Take 1 Tablet By Mouth Once A Day 2)  Allopurinol 100 Mg  Tabs (Allopurinol) .... Take 2 By Mouth Once Daily 3)  Plavix 75 Mg  Tabs (Clopidogrel Bisulfate) .... Take 1 Tablet By Mouth Once A Day 4)  Crestor 40 Mg Tabs (Rosuvastatin Calcium)  .... Take 1 Tablet By Mouth Once A Day 5)  Protonix 40 Mg  Tbec (Pantoprazole Sodium) .... Take 1 By Mouth Qd 6)  Furosemide 40 Mg  Tabs (Furosemide) .... Take 1 By Mouth Qd 7)  Metformin Hcl 850 Mg  Tabs (Metformin Hcl) .... Take 1 Tablet By Mouth Two Times A Day 8)  Nitrostat 0.4 Mg  Subl (Nitroglycerin) .Marland Kitchen.. 1 Tablet Under Tongue  Every 5 Minutes As Needed Up To 3 Doses 9)  Tramadol Hcl 50 Mg Tabs (Tramadol Hcl) .... Take 1 Tablet By Mouth Once A Day 10)  Cpap 14 [facility] 11)  Metoprolol Succinate 25 Mg  Xr24h-Tab (Metoprolol Succinate) .... One By Mouth Once Daily 12)  Tizanidine Hcl 2 Mg  Tabs (Tizanidine Hcl) .... Take 1 Tab By Mouth At Bedtime 13)  Levothyroxine Sodium 50 Mcg Tabs (Levothyroxine Sodium) .... One By Mouth Once Daily 14)  Mucinex 600 Mg Xr12h-Tab (Guaifenesin) .... Uad 15)  Tamiflu 75 Mg Caps (Oseltamivir Phosphate) .... 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  Vital Signs:  Patient profile:   65 year old male Height:      75 inches Weight:      240 pounds Pulse rate:   69 / minute BP sitting:   124 / 82  (left arm) BP standing:   124 / 82  (left arm)  Vitals Entered By: Laurance Flatten CMA (May 22, 2009 2:16 PM)  Physical Exam  General:  No acute distress. walks with a limp. no resp difficulty HEENT: normal Neck: supple. no JVD. Carotids 2+ bilat; no bruits. No lymphadenopathy or thryomegaly appreciated. Cor: PMI nondisplaced. Regular rate & rhythm. No rubs, gallops, murmur. Lungs: clear Abdomen: soft, nontender, nondistended. No hepatosplenomegaly. No  bruits or masses. Good bowel sounds. Extremities: no cyanosis, clubbing, rash, edema Neuro: alert & orientedx3, cranial nerves grossly intact.    Impression & Recommendations:  Problem # 1:  DIZZINESS (ICD-780.4) Suspect symptoms due to volume depletion though not frankly orthostatic here. Symptoms not consistent with vertigo. Will change lasix back to every other day. Check CBC and BMET today. F/u with Dr. Artist Pais as scheduled next week.   Problem # 2:  CAD, NATIVE VESSEL (ICD-414.01) Stable. No evidence of ischemia. Continue current regimen. Plavix for at least 1 year post-stent.  Other Orders: TLB-CBC Platelet - w/Differential ([id number]) TLB-BMP (Basic Metabolic Panel-BMET) (80048-METABOL)  Patient Instructions: 1)  Your physician recommends that you schedule a follow-up appointment in: 6 months 2)  Your physician has recommended you make the following change in your medication: Do not take furosemide for 2 days. Then resume furosemide every other day.

## 2010-04-16 NOTE — Assessment & Plan Note (Signed)
Summary: rov 1 month ///kp   Primary Provider/Referring Provider:  Dondra Spry DO  CC:  1 month follow up - discuss ONO results.  Feels rested.  c/o vertigo and left side sinus pressure x 2 month..  History of Present Illness: December 18, 2008- OSA Treated by Dr Artist Pais for sinusitis but he blames exposures as he got ready to build a house and was outdoors. He didn't think it was the CPAP. Has had more frequent problems like this in the Fall the last few years. He "struggles" with cpap, but says it works for him and he is getting more sleep. His dogs get him up a couple of times a night. This CPAP is comfortable at 14. He declines consideration of sleep med.  December 17, 2009- OSA Recent medical notes reviewed- dx'd w/ BPV. He still feels unsteady after trying positioning therapy. He suspects BP is too low and will discuss w/ Dr Artist Pais. He stopped using CPAP a year ago.  He gave up- couldn't get comfortable with it. He tried autopap which was more comfortble, but never could tell how it was benefiting him. He says he is not being told he snores much. Denies daytime sleepiness. He stays busy- was building a house.  January 25, 2010- OSA, hypoxia,  Overnight oximetry recorded 6 minute, 4 seconds with sat less than 88%.  He is not on CPAP anymore, and denies snore or daytime sleepiness.  We discussed his cerebroivascular disease and hx of CVA as reasons to be more aggressive about using O2 during sleep.    Preventive Screening-Counseling & Management  Alcohol-Tobacco     Smoking Status: never  Current Medications (verified): 1)  Aspirin 81 Mg Tbec (Aspirin) .... Take 1 Tablet By Mouth Once A Day 2)  Allopurinol 100 Mg  Tabs (Allopurinol) .... Take 2 By Mouth Once Daily 3)  Plavix 75 Mg  Tabs (Clopidogrel Bisulfate) .... Take 1 Tablet By Mouth Once A Day 4)  Crestor 40 Mg Tabs (Rosuvastatin Calcium) .... Take 1 Tablet By Mouth Once A Day 5)  Protonix 40 Mg  Tbec (Pantoprazole Sodium) ....  Take 1 By Mouth Qd 6)  Furosemide 40 Mg  Tabs (Furosemide) .... As Needed 7)  Nitrostat 0.4 Mg  Subl (Nitroglycerin) .Marland Kitchen.. 1 Tablet Under Tongue  Every 5 Minutes As Needed Up To 3 Doses 8)  Tramadol Hcl 50 Mg Tabs (Tramadol Hcl) .... Take 1 Tablet By Mouth Two Times A Day 9)  Metoprolol Succinate 25 Mg  Xr24h-Tab (Metoprolol Succinate) .... One By Mouth Once Daily 10)  Tizanidine Hcl 2 Mg  Tabs (Tizanidine Hcl) .... Take 1 Tab By Mouth At Bedtime 11)  Levothyroxine Sodium 100 Mcg Tabs (Levothyroxine Sodium) .... One By Mouth Once Daily 12)  Benazepril Hcl 5 Mg Tabs (Benazepril Hcl) .... Take 1 Tablet By Mouth Once A Day. 13)  Tramadol Hcl 100 Mg Xr24h-Tab (Tramadol Hcl) .... One By Mouth Once Daily in Am  Allergies (verified): No Known Drug Allergies  Past History:  Past Medical History: Last updated: 12/28/2009 GERD Gout  Hyperlipidemia     Hypertension   CVA- left hemiparesis   Chronic left sided pain- Kirsteins  DM II borderline    Coronary Artery Disease  --s/p CABG  --s/p DES to LCX January 2011 Obstructive sleep apnea on CPAP   Past Surgical History: Last updated: 12/28/2009 Coronary artery bypass graft, stent  EGD (03/25/2005)    nasal septoplasty  03/2009 - Percutaneous stenting using a drug-eluting platform of the     circumflex coronary artery with a 3.0 x 18 [employer] Promus      drug-eluting platform post-dilated to 3.75 with a noncompliant     balloon.  Family History: Last updated: 01/01/2010 Father- died lung cancer Mother- died ministrokes            Social History: Last updated: 01-01-10 Patient never smoked.  Married  Retired  -- Physicist, medical Alcohol Use - no   Regular Exercise - no Drug Use - no   Risk Factors: Exercise: no (05/04/2009)  Risk Factors: Smoking Status: never (01/25/2010)  Review of Systems      See HPI  The patient denies shortness of breath with activity, shortness of breath at rest, productive cough,  non-productive cough, coughing up blood, chest pain, irregular heartbeats, acid heartburn, indigestion, loss of appetite, weight change, abdominal pain, difficulty swallowing, sore throat, tooth/dental problems, headaches, nasal congestion/difficulty breathing through nose, and sneezing.    Vital Signs:  Patient profile:   65 year old male Height:      75 inches Weight:      253.50 pounds BMI:     31.80 O2 Sat:      94 % on Room air Pulse rate:   80 / minute BP sitting:   118 / 72  (right arm) Cuff size:   large  Vitals Entered By: Gweneth Dimitri RN (January 25, 2010 8:46 AM)  O2 Flow:  Room air CC: 1 month follow up - discuss ONO results.  Feels rested.  c/o vertigo and left side sinus pressure x 2 month. Comments Medications reviewed with patient Daytime contact number verified with patient. Gweneth Dimitri RN  January 25, 2010 9:23 AM    Physical Exam  Additional Exam:  General: A/Ox3; pleasant and cooperative, NAD, talkative, cheerful SKIN: no rash, lesions NODES: no lymphadenopathy HEENT: [place]/AT, EOM- WNL, Conjuctivae- clear, PERRLA, TM-WNL, Nose- clear, Throat- clear and wnl, Mallampati 111-IV NECK: Supple w/ fair ROM, JVD- none, normal carotid impulses w/o bruits Thyroid-  CHEST: Clear to P&A HEART: RRR, no m/g/r heard ABDOMEN: Soft  ZOX:WRUE, nl pulses, no edema  NEURO: Left hemispastic with good alertness, speech, and cognition      Impression & Recommendations:  Problem # 1:  OBSTRUCTIVE SLEEP APNEA (ICD-327.23)  I am not impressed that CPAP is necessary, but I think he should seriously consider a try of home O2 for sleep. He does desaturate some and has significant cerebrovascular disease carrying more risk of additional injury if he sustains desaturation. He accepts this argument for now and is willing to try home O2.  Medications Added to Medication List This Visit: 1)  Aspirin 81 Mg Tbec (Aspirin) .... Take 1 tablet by mouth once a day 2)  Furosemide 40 Mg  Tabs (Furosemide) .... As needed 3)  Oxygen 2 L/m For Sleep   Other Orders: Est. Patient Level III (45409) DME Referral (DME)  Patient Instructions: 1)  Please schedule a follow-up appointment in 4 months. 2)  See [facility] to set up oxygen for sleep

## 2010-04-16 NOTE — Progress Notes (Signed)
Summary: BP concern  Phone Note Call from Patient Call back at [phone]   Caller: Patient Call For: D. Thomos Lemons DO Summary of Call: Pt states he feels weaker since taking the 10mg  Benazepril, doesn't feel like himself. Pt states he still has light headedness upon wakening in the morning and  lasts most of the day.  States the dizziness is no worse and no better than when he last saw [place]. Please advise. Nicki Guadalajara Fergerson CMA Duncan Dull)  December 20, 2009 1:08 PM   Follow-up for Phone Call        if he can cut tab in half, then take 1/2 tab.   if not able,  please call in 5 mg dose of benazepril Follow-up by: D. Thomos Lemons DO,  December 20, 2009 1:11 PM  Additional Follow-up for Phone Call Additional follow up Details #1::        Advised pt per Dr Olegario Messier instruction. Pt states he still has some of the 5mg  tabs and he will go back to those then will try cutting 10mg  pill in half. Pt asked if going back to 5mg  will improve his weakness. Advised pt we expect weakness to improve on lower dose but if symptom continues he needs to make appt. to be seen. Pt voices understanding. Nicki Gu[place]ara Fergerson CMA Duncan Dull)  December 20, 2009 2:25 PM  Additional Follow-up by: Mervin Kung CMA Duncan Dull),  December 20, 2009 2:30 PM    New/Updated Medications: BENAZEPRIL HCL 5 MG TABS (BENAZEPRIL HCL) Take 1 tablet by mouth once a day.

## 2010-04-16 NOTE — Progress Notes (Signed)
Summary: MRI Results  Phone Note Outgoing Call   Summary of Call: call pt - no acute findings on MRI of Brain  stop metformin to see if it helps with dizziness Initial call taken by: D. Thomos Lemons DO,  December 31, 2009 12:15 PM  Follow-up for Phone Call        call placed to patient at  [phone], no answer a detailed  voice message was left informing patient per Dr Artist Pais instructions. Message left for patient to call if any questions Follow-up by: Glendell Docker CMA,  December 31, 2009 3:08 PM      Immunization History:  Influenza Immunization History:    Influenza:  historical (12/26/2009)

## 2010-04-16 NOTE — Miscellaneous (Signed)
Summary: [facility]  [facility]   Imported By: Roderic Ovens 04/06/2009 14:33:50  _____________________________________________________________________  External Attachment:    Type:   Image     Comment:   External Document

## 2010-04-16 NOTE — Assessment & Plan Note (Signed)
Summary: 4 mo. f/u - jr--Rm 3   Vital Signs:  Patient profile:   65 year old male Height:      75 inches Weight:      252.50 pounds BMI:     31.67 Temp:     97.5 degrees F oral Pulse rate:   60 / minute Pulse rhythm:   regular Resp:     16 per minute BP sitting:   120 / 70  (right arm) Cuff size:   large  Vitals Entered By: Mervin Kung CMA (August 24, 2009 8:03 AM) CC: Room 3  4 month follow up.   Is Patient Diabetic? Yes Comments Pt states he is taking 2 types of Tramadol. States he also takes a slow release that isn't listed.   Primary Care Provider:  Dondra Spry DO  CC:  Room 3  4 month follow up.  Marland Kitchen  History of Present Illness: 65 y/o white male for f/u nausea and dizziness resolved overall feels well - esp after starting thyroid medication  finishing his daughter's house last part - contract out pouring contrete  DM II borderline - wt stable.  switched beverage.  usually drinks unsweetened tea.  prev drank occ sprite  htn - stable  Allergies (verified): No Known Drug Allergies  Past History:  Past Medical History: GERD Gout  Hyperlipidemia     Hypertension   CVA- left hemiparesis   Chronic left sided pain- Kirsteins  DM II borderline    Coronary Artery Disease  --s/p CABG  --s/p DES to LCX January 2011 Obstructive sleep apnea on CPAP  Family History: Father- died lung cancer Mother- died ministrokes           Social History: Patient never smoked.  Married  Retired  -- Physicist, medical Alcohol Use - no  Regular Exercise - no Drug Use - no   Physical Exam  General:  alert, well-developed, and well-nourished.   Lungs:  normal respiratory effort and normal breath sounds.   Heart:  normal rate, regular rhythm, and no gallop.   Extremities:  trace right pedal edema.   Neurologic:  cranial nerves II-XII intact.  walks with limp Psych:  normally interactive and good eye contact.     Impression & Recommendations:  Problem # 1:   HYPOTHYROIDISM (ICD-244.9) thyroid antibodies positive.  monitor TSH.  goal between 1-2  His updated medication list for this problem includes:    Levothyroxine Sodium 50 Mcg Tabs (Levothyroxine sodium) ..... One by mouth once daily  Orders: T-TSH ([id number])  Problem # 2:  HYPERTENSION (ICD-401.9) well controlled.  less dizziness when he take lasix qod His updated medication list for this problem includes:    Furosemide 40 Mg Tabs (Furosemide) .Marland Kitchen... Take 1 by mouth every other day    Metoprolol Succinate 25 Mg Xr24h-tab (Metoprolol succinate) ..... One by mouth once daily    Benazepril Hcl 10 Mg Tabs (Benazepril hcl) .Marland Kitchen... Take 1 tablet by mouth once a day  Orders: T-Basic Metabolic Panel [id number])  BP today: 120/70 Prior BP: 118/70 (05/28/2009)  Labs Reviewed: K+: 3.6 (05/22/2009) Creat: : 1.2 (05/22/2009)   Chol: 110 (04/02/2009)   HDL: 49 (04/02/2009)   LDL: 48 (04/02/2009)   TG: 67 (04/02/2009)  Problem # 3:  DIABETES MELLITUS, TYPE II, BORDERLINE (ICD-790.29) dietary counseling provided.  monitor A1c.  stays active with projects around the house.   once home projects completed he may consider [facility] [employer] His updated medication list for this problem includes:  Metformin Hcl 500 Mg Tabs (Metformin hcl) ..... One by mouth two times a day  Orders: T- Hemoglobin A1C ([id number])  Labs Reviewed: Creat: 1.2 (05/22/2009)     Complete Medication List: 1)  Bufferin 325 Mg Tabs (Aspirin buf(cacarb-mgcarb-mgo)) .... Take 1 tablet by mouth once a day 2)  Allopurinol 100 Mg Tabs (Allopurinol) .... Take 2 by mouth once daily 3)  Plavix 75 Mg Tabs (Clopidogrel bisulfate) .... Take 1 tablet by mouth once a day 4)  Crestor 40 Mg Tabs (Rosuvastatin calcium) .... Take 1 tablet by mouth once a day 5)  Protonix 40 Mg Tbec (Pantoprazole sodium) .... Take 1 by mouth qd 6)  Furosemide 40 Mg Tabs (Furosemide) .... Take 1 by mouth every other day 7)  Metformin Hcl 500 Mg  Tabs (Metformin hcl) .... One by mouth two times a day 8)  Nitrostat 0.4 Mg Subl (Nitroglycerin) .Marland Kitchen.. 1 tablet under tongue  every 5 minutes as needed up to 3 doses 9)  Tramadol Hcl 50 Mg Tabs (Tramadol hcl) .... Take 1 tablet by mouth two times a day 10)  Cpap 11 [facility]  11)  Metoprolol Succinate 25 Mg Xr24h-tab (Metoprolol succinate) .... One by mouth once daily 12)  Tizanidine Hcl 2 Mg Tabs (Tizanidine hcl) .... Take 1 tab by mouth at bedtime 13)  Levothyroxine Sodium 50 Mcg Tabs (Levothyroxine sodium) .... One by mouth once daily 14)  Benazepril Hcl 10 Mg Tabs (Benazepril hcl) .... Take 1 tablet by mouth once a day 15)  Tramadol Hcl 100 Mg Xr24h-tab (Tramadol hcl) .... One by mouth once daily in am  Patient Instructions: 1)  Please schedule a follow-up appointment in 4 months.  Current Allergies (reviewed today): No known allergies

## 2010-04-16 NOTE — Progress Notes (Signed)
Summary: Lab Results  Phone Note Outgoing Call   Summary of Call: call pt - blood tests shows pt needs thyroid replacement.  see rx for levothyroxine.  take medication as directed f/u in 6 wks fo repeat TSH  244.90 Initial call taken by: D. Thomos Lemons DO,  April 05, 2009 1:17 PM  Follow-up for Phone Call        attempted to contact patient at [phone] no answer, voice message left to return call regarding medication change Follow-up by: Glendell Docker CMA,  April 05, 2009 5:20 PM  Additional Follow-up for Phone Call Additional follow up Details #1::        spoke with patient advised he needs to start on levothyrosine and Dr Artist Pais [facility]  also made appt for patient to repeat TSH Roselle Locus  April 06, 2009 8:51 AM    New/Updated Medications: LEVOTHYROXINE SODIUM 25 MCG TABS (LEVOTHYROXINE SODIUM) one by mouth once daily Prescriptions: LEVOTHYROXINE SODIUM 25 MCG TABS (LEVOTHYROXINE SODIUM) one by mouth once daily  #30 x 1   Entered and Authorized by:   D. Thomos Lemons DO   Signed by:   D. Thomos Lemons DO on 04/05/2009   Method used:   [facility]  [address] [id number]       [address]       [place], [place]  [address]       Ph: [phone] or [phone]       Fax: [phone]   RxID:   [id number]

## 2010-04-16 NOTE — Progress Notes (Signed)
Summary: Allopurinol Refill  Phone Note Refill Request Call back at Work Phone [phone]   Refills Requested: Medication #1:  ALLOPURINOL 100 MG  TABS take 2 by mouth once daily   Dosage confirmed as above?Dosage Confirmed Next Appointment Scheduled: 6.10.11 8AM Initial call taken by: Lannette Donath,  June 26, 2009 3:29 PM Caller: Patient Reason for Call: Refill Medication  Follow-up for Phone Call        call returned to patient to verify which pharmacy he would like his rx sent to, he [facility] [address]  Patient is aware the rx has been sent to the pharmacy Follow-up by: Glendell Docker CMA,  June 26, 2009 4:50 PM    Prescriptions: ALLOPURINOL 100 MG  TABS (ALLOPURINOL) take 2 by mouth once daily  #60 x 5   Entered by:   Glendell Docker CMA   Authorized by:   D. Thomos Lemons DO   Signed by:   Glendell Docker CMA on 06/26/2009   Method used:   [facility]        [facility]  [address] [id number]       [address]       [place], [place]  [address]       Ph: [phone] or [phone]       Fax: [phone]   RxID:   [id number]

## 2010-04-16 NOTE — Assessment & Plan Note (Signed)
Summary: hospital follow up/mhf   Vital Signs:  Patient profile:   65 year old male Weight:      251.50 pounds BMI:     31.55 O2 Sat:      100 % on Room air Temp:     97.4 degrees F oral Pulse rate:   57 / minute Pulse rhythm:   regular Resp:     16 per minute BP sitting:   118 / 70  (right arm) Cuff size:   large  Vitals Entered By: Glendell Docker CMA (May 28, 2009 8:55 AM)  O2 Flow:  Room air CC: Rm 2- Hospital Follow up  Comments c/o  unresolved dizzines and stomach discomfort   Primary Care Provider:  Dondra Spry DO  CC:  Rm 2- Hospital Follow up .  History of Present Illness: 65 y/o white male recenlty admitted for flu like illness pt txed with tamiflu feeling somewhat better but still feels lightheaded and nauseated no vomiting  Allergies (verified): No Known Drug Allergies  Past History:  Past Medical History: GERD Gout  Hyperlipidemia     Hypertension   CVA- left hemiparesis   Chronic left sided pain- Kirsteins DM II borderline    Coronary Artery Disease  --s/p CABG  --s/p DES to LCX January 2011 Obstructive sleep apnea on CPAP  Past Surgical History: Coronary artery bypass graft, stent  EGD (03/25/2005)   nasal septoplasty         03/2009 - Percutaneous stenting using a drug-eluting platform of the     circumflex coronary artery with a 3.0 x 18 [employer] Promus      drug-eluting platform post-dilated to 3.75 with a noncompliant     balloon.  Family History: Father- died lung cancer Mother- died ministrokes          Social History: Patient never smoked.  Married  Retired  -- Physicist, medical Alcohol Use - no  Regular Exercise - no Drug Use - no  Physical Exam  General:  alert, well-developed, and well-nourished.   Lungs:  normal respiratory effort and normal breath sounds.   Heart:  normal rate, regular rhythm, and no gallop.   Abdomen:  soft, non-tender, and normal bowel sounds.     Impression &  Recommendations:  Problem # 1:  DIZZINESS (ICD-780.4) Pt with recent URI / flu like illness.  son has similar symptoms.  no orthostasis.  consider mild labrynthitis.   hold metformin x 2 weeks. restart at lower dose.  also decrease tramadol use.  Complete Medication List: 1)  Bufferin 325 Mg Tabs (Aspirin buf(cacarb-mgcarb-mgo)) .... Take 1 tablet by mouth once a day 2)  Allopurinol 100 Mg Tabs (Allopurinol) .... Take 2 by mouth once daily 3)  Plavix 75 Mg Tabs (Clopidogrel bisulfate) .... Take 1 tablet by mouth once a day 4)  Crestor 40 Mg Tabs (Rosuvastatin calcium) .... Take 1 tablet by mouth once a day 5)  Protonix 40 Mg Tbec (Pantoprazole sodium) .... Take 1 by mouth qd 6)  Furosemide 40 Mg Tabs (Furosemide) .... Take 1 by mouth every other day 7)  Metformin Hcl 500 Mg Tabs (Metformin hcl) .... One by mouth two times a day 8)  Nitrostat 0.4 Mg Subl (Nitroglycerin) .Marland Kitchen.. 1 tablet under tongue  every 5 minutes as needed up to 3 doses 9)  Tramadol Hcl 50 Mg Tabs (Tramadol hcl) .... Take 1 tablet by mouth once a day 10)  Cpap 14 [facility]  11)  Metoprolol  Succinate 25 Mg Xr24h-tab (Metoprolol succinate) .... One by mouth once daily 12)  Tizanidine Hcl 2 Mg Tabs (Tizanidine hcl) .... Take 1 tab by mouth at bedtime 13)  Levothyroxine Sodium 50 Mcg Tabs (Levothyroxine sodium) .... One by mouth once daily 14)  Benazepril Hcl 10 Mg Tabs (Benazepril hcl) .... Take 1 tablet by mouth once a day  Patient Instructions: 1)  Call our office if your symptoms do not  improve or gets worse. 2)  Hold metformin x 2 weeks. 3)  Restart metformin at 1/2 dose two times a day x 1-2 weeks, then resume normal dose Prescriptions: METFORMIN HCL 500 MG TABS (METFORMIN HCL) one by mouth two times a day  #180 x 3   Entered and Authorized by:   D. Thomos Lemons DO   Signed by:   D. Thomos Lemons DO on 05/28/2009   Method used:   [facility]  [address] [id number]       [address]       [place], [place]  [address]       Ph: [phone] or [phone]       Fax: [phone]   RxID:   [id number] METFORMIN HCL 500 MG TABS (METFORMIN HCL) one by mouth two times a day  #60 x 5   Entered and Authorized by:   D. Thomos Lemons DO   Signed by:   D. Thomos Lemons DO on 05/28/2009   Method used:   [facility]        [facility]  [address] [id number]       [address]       [place], [place]  [address]       Ph: [phone] or [phone]       Fax: [phone]   RxID:   [id number]   Current Allergies (reviewed today): No known allergies

## 2010-04-16 NOTE — Progress Notes (Signed)
Summary: Crestor, Benazepril, Furosemide Refill  Phone Note Refill Request Call back at Work Phone [phone] Message from:  Patient on December 14, 2009 9:33 AM  Refills Requested: Medication #1:  CRESTOR 40 MG TABS Take 1 tablet by mouth once a day   Dosage confirmed as above?Dosage Confirmed   Brand Name Necessary? No   Supply Requested: 3 months   Last Refilled: 09/14/2009  Medication #2:  BENAZEPRIL HCL 10 MG TABS Take 1 tablet by mouth once a day   Brand Name Necessary? No   Supply Requested: 3 months   Last Refilled: 09/18/2009   Notes: pt states bottle shows 5 MG  Medication #3:  FUROSEMIDE 40 MG  TABS take 1 by mouth every other day   Dosage confirmed as above?Dosage Confirmed   Brand Name Necessary? No   Supply Requested: 3 months   Last [facility]: [facility]14/[facility] [address]   Method Requested: Electronic Next Appointment Scheduled: 10.3.2011 Initial call taken by: Lannette Donath,  December 14, 2009 9:34 AM  Follow-up for Phone Call        Rx completed in Dr. Tiajuana Amass Follow-up by: Glendell Docker CMA,  December 14, 2009 11:10 AM    Prescriptions: BENAZEPRIL HCL 10 MG TABS (BENAZEPRIL HCL) Take 1 tablet by mouth once a day  #30 x 3   Entered by:   Glendell Docker CMA   Authorized by:   D. Thomos Lemons DO   Signed by:   Glendell Docker CMA on 12/14/2009   Method used:   [facility]        [facility]  [address] [id number]       [address]       [place], [place]  [address]       Ph: [phone] or [phone]       Fax: [phone]   RxID:   [id number] FUROSEMIDE 40 MG  TABS (FUROSEMIDE) take 1 by mouth every other day  #45 x 3   Entered by:   Glendell Docker CMA   Authorized by:   D. Thomos Lemons DO   Signed by:   Glendell Docker CMA on 12/14/2009   Method used:   [facility]        [facility]  [address] [id number]       [address]       [place], [place]  [address]       Ph: [phone] or  [phone]       Fax: [phone]   RxID:   [id number] CRESTOR 40 MG TABS (ROSUVASTATIN CALCIUM) Take 1 tablet by mouth once a day  #30 x 3   Entered by:   Glendell Docker CMA   Authorized by:   D. Thomos Lemons DO   Signed by:   Glendell Docker CMA on 12/14/2009   Method used:   [facility]        [facility]  [address] [id number]       [address]       [place], [place]  [address]       Ph: [phone] or [phone]       Fax: [phone]   RxID:   [id number]

## 2010-04-16 NOTE — Progress Notes (Signed)
Summary: Crestor 90 day  Phone Note Refill Request Message from:  Patient on February 11, 2010 10:09 AM  Refills Requested: Medication #1:  CRESTOR 40 MG TABS Take 1 tablet by mouth once a day   Dosage confirmed as above?Dosage Confirmed   Brand Name Necessary? No   Supply Requested: 3 months patient wants 90 day supply as that will cost him much less  [facility] [address]    Method Requested: Electronic Next Appointment Scheduled: 03-01-10 [place]  Initial call taken by: Roselle Locus,  February 11, 2010 10:10 AM    Prescriptions: CRESTOR 40 MG TABS (ROSUVASTATIN CALCIUM) Take 1 tablet by mouth once a day  #90 x 2   Entered by:   Glendell Docker CMA   Authorized by:   D. Thomos Lemons DO   Signed by:   Glendell Docker CMA on 02/11/2010   Method used:   [facility]        [facility]  [address] [id number]       [address]       [place], [place]  [address]       Ph: [phone] or [phone]       Fax: [phone]   RxID:   [id number]

## 2010-04-16 NOTE — Letter (Signed)
   [place] [facility] [address] [name], [place]  [address]  [place] Phone: [phone]      November 26, 2009   [name] [address] [place], [place] [address]  RE:  LAB RESULTS  Dear  [name],  The following is an interpretation of your most recent lab tests.  Please take note of any instructions provided or changes to medications that have resulted from your lab work.  ELECTROLYTES:  Good - no changes needed   THYROID STUDIES:  Thyroid level too low TSH: 3.690      CBC:  Good - no changes needed       Sincerely Yours,    Dr. Thomos Lemons  Appended Document:  mailed

## 2010-04-16 NOTE — Letter (Signed)
Summary: [facility]   [facility]   Imported By: Kassie Mends 04/30/2009 11:29:32  _____________________________________________________________________  External Attachment:    Type:   Image     Comment:   External Document

## 2010-04-16 NOTE — Progress Notes (Signed)
Summary: Protonix Refill  Phone Note Refill Request Message from:  Fax from Pharmacy on December 10, 2009 9:19 AM  Refills Requested: Medication #1:  pantroprazole sod dr 40 mg tab   Dosage confirmed as above?Dosage Confirmed   Brand Name Necessary? No   Supply Requested: 3 months   Last Refilled: 09/07/2009  Method Requested: Electronic Next Appointment Scheduled: 12-17-09 Dr Maple Hudson Initial call taken by: Roselle Locus,  December 10, 2009 9:20 AM    Prescriptions: PROTONIX 40 MG  TBEC (PANTOPRAZOLE SODIUM) take 1 by mouth qd  #90 x 1   Entered by:   Glendell Docker CMA   Authorized by:   D. Thomos Lemons DO   Signed by:   Glendell Docker CMA on 12/10/2009   Method used:   [facility]  [address] [id number]       [address]       [place], [place]  [address]       Ph: [phone] or [phone]       Fax: [phone]   RxID:   [id number]

## 2010-04-16 NOTE — Assessment & Plan Note (Signed)
Summary: 2 month follow up/mhf   Vital Signs:  Patient profile:   65 year old male Weight:      248.25 pounds BMI:     31.14 O2 Sat:      99 % on Room air Temp:     97.6 degrees F oral Pulse rate:   58 / minute Pulse rhythm:   regular Resp:     16 per minute BP sitting:   120 / 70  (right arm) Cuff size:   large  Vitals Entered By: Glendell Docker CMA (April 02, 2009 8:32 AM)  O2 Flow:  Room air  Primary Care Provider:  D. Thomos Lemons DO  CC:  2 Month Follow Up.  History of Present Illness: 2 Month Follow up disease management  65 y/o white male for follow up.  since prev visit - pt admitted for angina.   stent placed 2 weeks go- appt with Dr Clarise Cruz 05/07/2009 no chest pain  during hospitalization - TSH upper limit of normal.  there is question of hypothyroidism.    Past History:  Past Medical History: GERD Gout  Hyperlipidemia    Hypertension  CVA- left hemiparesis Chronic left sided pain- Kirsteins DM II borderline    Coronary Artery Disease Obstructive sleep apnea on CPAP  Past Surgical History: Coronary artery bypass graft, stent  EGD (03/25/2005)  nasal septoplasty       03/2009 - Percutaneous stenting using a drug-eluting platform of the     circumflex coronary artery with a 3.0 x 18 [employer] Promus     drug-eluting platform post-dilated to 3.75 with a noncompliant     balloon.  Family History: Father- died lung cancer Mother- died ministrokes      Social History: Patient never smoked.  Married  Retired        Physical Exam  General:  alert, well-developed, and well-nourished.   Lungs:  normal respiratory effort and normal breath sounds.   Heart:  normal rate, regular rhythm, and no gallop.   Extremities:  trace left pedal edema and trace right pedal edema.   Neurologic:  cranial nerves II-XII intact.   Psych:  normally interactive, good eye contact, not anxious appearing, and not depressed appearing.     Impression &  Recommendations:  Problem # 1:  THYROID FUNCTION TEST, ABNORMAL (ICD-794.5) there is question of hypothroidism.  repeat TFTs.  euthyroid sick vs autoimmune .  check thyroid antibodies Orders: T-TSH ([id number]) T-T4, Thyroxine; Total [id number]) T- * Misc. Laboratory test [id number])  Problem # 2:  HYPERTENSION (ICD-401.9) Assessment: Improved well controlled. he complains of urinary freq.  check BMET  His updated medication list for this problem includes:    Furosemide 40 Mg Tabs (Furosemide) .Marland Kitchen... Take 1 by mouth qd    Metoprolol Succinate 25 Mg Xr24h-tab (Metoprolol succinate) ..... One by mouth once daily    Benazepril Hcl 5 Mg Tabs (Benazepril hcl) ..... One by mouth once daily  Orders: T-Basic Metabolic Panel ([id number])  BP today: 120/70 Prior BP: 140/90 (01/29/2009)  Labs Reviewed: K+: 3.9 (07/19/2008) Creat: : 1.0 (07/19/2008)   Chol: 114 (07/19/2008)   HDL: 38.80 (07/19/2008)   LDL: 61 (07/19/2008)   TG: 73.0 (07/19/2008)  Problem # 3:  HYPERLIPIDEMIA (ICD-272.4)  His updated medication list for this problem includes:    Crestor 40 Mg Tabs (Rosuvastatin calcium) .Marland Kitchen... Take 1 tablet by mouth once a day  Orders: T-Lipid Profile ([id number]) T-Hepatic Function [id number])  Problem # 4:  GERD (ICD-530.81) Pt on  protonix and plavix.  Defer to cardiology whether to change plavix to effient His updated medication list for this problem includes:    Protonix 40 Mg Tbec (Pantoprazole sodium) .Marland Kitchen... Take 1 by mouth qd  Problem # 5:  DIABETES MELLITUS, TYPE II, BORDERLINE (ICD-790.29)  His updated medication list for this problem includes:    Metformin Hcl 850 Mg Tabs (Metformin hcl) .Marland Kitchen... Take 1 tablet by mouth two times a day  Orders: T- Hemoglobin A1C ([id number])  Complete Medication List: 1)  Bufferin 325 Mg Tabs (Aspirin buf(cacarb-mgcarb-mgo)) .... Take 1 tablet by mouth once a day 2)  Allopurinol 100 Mg Tabs (Allopurinol) .... Take 2 by mouth once  daily 3)  Plavix 75 Mg Tabs (Clopidogrel bisulfate) .... Take 1 tablet by mouth once a day 4)  Crestor 40 Mg Tabs (Rosuvastatin calcium) .... Take 1 tablet by mouth once a day 5)  Protonix 40 Mg Tbec (Pantoprazole sodium) .... Take 1 by mouth qd 6)  Furosemide 40 Mg Tabs (Furosemide) .... Take 1 by mouth qd 7)  Metformin Hcl 850 Mg Tabs (Metformin hcl) .... Take 1 tablet by mouth two times a day 8)  Nitrostat 0.4 Mg Subl (Nitroglycerin) .Marland Kitchen.. 1 tablet under tongue  every 5 minutes as needed up to 3 doses 9)  Tramadol Hcl 50 Mg Tabs (Tramadol hcl) .... Take 1 tablet by mouth once a day 10)  Cpap 14 [facility]  11)  Metoprolol Succinate 25 Mg Xr24h-tab (Metoprolol succinate) .... One by mouth once daily 12)  Tizanidine Hcl 2 Mg Tabs (Tizanidine hcl) .... Take 1 tab by mouth at bedtime 13)  Benazepril Hcl 5 Mg Tabs (Benazepril hcl) .... One by mouth once daily 14)  Levothyroxine Sodium 25 Mcg Tabs (Levothyroxine sodium) .... One by mouth once daily  Patient Instructions: 1)  Please schedule a follow-up appointment in 3 months. Prescriptions: BENAZEPRIL HCL 5 MG TABS (BENAZEPRIL HCL) one by mouth once daily  #90 x 3   Entered and Authorized by:   D. Thomos Lemons DO   Signed by:   D. Thomos Lemons DO on 04/02/2009   Method used:   [facility] [id number]       [address]       [place], [place]  [address]       Ph: [phone] or [phone]       Fax: [phone]   RxID:   [id number]   Current Allergies (reviewed today): No known allergies    Immunization History:  Pneumovax Immunization History:    Pneumovax:  historical (03/21/2009)

## 2010-04-16 NOTE — Letter (Signed)
   [place] at [facility] [address] [place], [place]  [address]  [place] Phone: [phone]      April 05, 2009   DIQUAN KASSIS [address] [place], [place] [address]  RE:  LAB RESULTS  Dear  Mr. Mcgriff,  The following is an interpretation of your most recent lab tests.  Please take note of any instructions provided or changes to medications that have resulted from your lab work.  ELECTROLYTES:  Good - no changes needed  KIDNEY FUNCTION TESTS:  Good - no changes needed  LIVER FUNCTION TESTS:  Good - no changes needed  LIPID PANEL:  Good - no changes needed Triglyceride: 67   Cholesterol: 110   LDL: 48   HDL: 49   Chol/HDL%:  2.2 Ratio  THYROID STUDIES:  Thyroid level too low TSH: 4.513     DIABETIC STUDIES:  Good - no changes needed Blood Glucose: 104   HgbA1C: 5.9          Sincerely Yours,    Dr. Thomos Lemons

## 2010-04-16 NOTE — Progress Notes (Signed)
Summary: 90 day Allopurinol  Phone Note Call from Patient   Caller: patient live Call For: [name]  Summary of Call: patient says his alluporinal has always been a 90 day rx.  please update his refill to a 90 day  Initial call taken by: Roselle Locus,  July 03, 2009 9:22 AM  Follow-up for Phone Call        Rx changed to 90 day supply with one refill. Rx has been sent electronically to pharmacy Follow-up by: Glendell Docker CMA,  July 03, 2009 10:39 AM    Prescriptions: ALLOPURINOL 100 MG  TABS (ALLOPURINOL) take 2 by mouth once daily  #180 x 1   Entered by:   Glendell Docker CMA   Authorized by:   D. Thomos Lemons DO   Signed by:   Glendell Docker CMA on 07/03/2009   Method used:   [facility]  [address] [id number]       [address]       [place], [place]  [address]       Ph: [phone] or [phone]       Fax: [phone]   RxID:   [id number]

## 2010-04-17 ENCOUNTER — Encounter (INDEPENDENT_AMBULATORY_CARE_PROVIDER_SITE_OTHER): Payer: Self-pay | Admitting: *Deleted

## 2010-04-17 HISTORY — PX: CATARACT EXTRACTION: SUR2

## 2010-04-18 NOTE — Letter (Signed)
Summary: Colonoscopy Letter  [facility]  [address] [place], [place] [address]   Phone: [phone]  Fax: [phone]      March 27, 2010 MRN: [id number]   [name] [address] [place], [place]  [address]   Dear [name],   According to your medical record, it is time for you to schedule a Colonoscopy. The American Cancer Society recommends this procedure as a method to detect early colon cancer. Patients with a family history of colon cancer, or a personal history of colon polyps or inflammatory bowel disease are at increased risk.  This letter has been generated based on the recommendations made at the time of your procedure. If you feel that in your particular situation this may no longer apply, please contact our office.  Please call our office at [phone] to schedule this appointment or to update your records at your earliest convenience.  Thank you for cooperating with [place] to provide you with the very best care possible.   Sincerely,  Judie Petit T. Russella Dar, M.D.  [facility] [phone]

## 2010-04-18 NOTE — Assessment & Plan Note (Signed)
Summary: still dizzy/mhf   Vital Signs:  Patient profile:   65 year old male Height:      75 inches Weight:      253.50 pounds BMI:     31.80 O2 Sat:      98 % on Room air Temp:     97.5 degrees F oral Pulse rate:   62 / minute Resp:     18 per minute BP sitting:   120 / 70  (right arm) Cuff size:   large  Vitals Entered By: Glendell Docker CMA (February 13, 2010 11:26 AM)  O2 Flow:  Room air CC: Vertigo Is Patient Diabetic? No Pain Assessment Patient in pain? no      Comments c/o unresolved dizziness in the morning after the use of oxygen at night, lasting  throughout the day Per Pharmacist, pt received Nasonex nasal spray. 1 spray each nostril once a day and a course of Augmentin from Dr Ezzard Standing in October. Nicki Guadalajara Fergerson CMA Duncan Dull)  February 13, 2010 11:50 AM    Primary Care Provider:  Dondra Spry DO  CC:  Vertigo.  History of Present Illness: 65 y/o white male for follow up re:  dizziness/vertigo he was seen by ENT they recommended saline nasal flushes - no improvement dizziness worse with walking not sure if symptoms related to taking pain meds not related to bp meds - he is not orthostatic  allergies - minimal improvement with intranasal saline  Preventive Screening-Counseling & Management  Alcohol-Tobacco     Smoking Status: never  Allergies (verified): No Known Drug Allergies  Past History:  Past Medical History: GERD Gout  Hyperlipidemia     Hypertension    CVA- left hemiparesis   Chronic left sided pain- Kirsteins  DM II borderline    Coronary Artery Disease  --s/p CABG  --s/p DES to LCX January 2011 Obstructive sleep apnea on CPAP   Past Surgical History: Coronary artery bypass graft, stent  EGD (03/25/2005)    nasal septoplasty         03/2009 - Percutaneous stenting using a drug-eluting platform of the     circumflex coronary artery with a 3.0 x 18 [employer] Promus      drug-eluting platform post-dilated to 3.75 with a  noncompliant     balloon.    Physical Exam  General:  alert, well-developed, and well-nourished.   Eyes:  pupils equal, pupils round, and pupils reactive to light.   Ears:  R ear normal and L ear normal.   Lungs:  normal respiratory effort and normal breath sounds.   Heart:  normal rate, regular rhythm, and no gallop.   Neurologic:  cranial nerves II-XII intact.     Impression & Recommendations:  Problem # 1:  DIZZINESS (ICD-780.4) Assessment Deteriorated seen by ENT no improvement with tx for allergies  question dizziness side effect of pain meds pt to try taking lower dose of tramadol  Problem # 2:  ALLERGIC RHINITIS (ICD-477.9) no improvement with intranasal saline trial of prednisone taper  Complete Medication List: 1)  Aspirin 81 Mg Tbec (Aspirin) .... Take 1 tablet by mouth once a day 2)  Allopurinol 100 Mg Tabs (Allopurinol) .... Take 2 by mouth once daily 3)  Plavix 75 Mg Tabs (Clopidogrel bisulfate) .... Take 1 tablet by mouth once a day 4)  Crestor 40 Mg Tabs (Rosuvastatin calcium) .... Take 1 tablet by mouth once a day 5)  Protonix 40 Mg Tbec (Pantoprazole sodium) .... Take 1 by  mouth qd 6)  Furosemide 40 Mg Tabs (Furosemide) .... As needed 7)  Nitrostat 0.4 Mg Subl (Nitroglycerin) .Marland Kitchen.. 1 tablet under tongue  every 5 minutes as needed up to 3 doses 8)  Tramadol Hcl 50 Mg Tabs (Tramadol hcl) .... Take 1 tablet by mouth two times a day 9)  Metoprolol Succinate 25 Mg Xr24h-tab (Metoprolol succinate) .... One by mouth once daily 10)  Tizanidine Hcl 2 Mg Tabs (Tizanidine hcl) .... Take 1 tab by mouth at bedtime 11)  Levothyroxine Sodium 100 Mcg Tabs (Levothyroxine sodium) .... One by mouth once daily 12)  Benazepril Hcl 5 Mg Tabs (Benazepril hcl) .... Take 1 tablet by mouth once a day. 13)  Tramadol Hcl 100 Mg Xr24h-tab (Tramadol hcl) .... One by mouth once daily in am 14)  Oxygen 2 L/m For Sleep  15)  Prednisone 10 Mg Tabs (Prednisone) .... 3 tabs by mouth once daily  x 3 days, 2 tabs by mouth once daily x 3 days, 1 tab by mouth once daily x 3 days  Patient Instructions: 1)  Take less tramadol as directed 2)  Please schedule a follow-up appointment in 1 month. 3)  The highlighted prescriptions were electronically sent to your pharmacy Prescriptions: PREDNISONE 10 MG TABS (PREDNISONE) 3 tabs by mouth once daily x 3 days, 2 tabs by mouth once daily x 3 days, 1 tab by mouth once daily x 3 days  #18 x 0   Entered and Authorized by:   D. Thomos Lemons DO   Signed by:   D. Thomos Lemons DO on 02/13/2010   Method used:   [facility]  [address] [id number]       [address]       [place], [place]  [address]       Ph: [phone] or [phone]       Fax: [phone]   RxID:   [id number]    Orders Added: 1)  Est. Patient Level III [03474]    Current Allergies (reviewed today): No known allergies

## 2010-04-18 NOTE — Assessment & Plan Note (Signed)
Summary: 6wk f/u sl      Allergies Added: NKDA  Visit Type:  Follow-up Primary [name]:  [name] Spry DO   History of Present Illness: [name]  is a 65 year old male with a history of coronary artery disease status post bypass surgery in 2004 and Taxus drug-eluting stent to the circumflex in 2005.  He had a heart catheterization in January 2011 showing a patent LIMA to the LAD, with chronic total occlusion of SVG - OM, and interval occlusion of SVG - RCA. Flow down native RCA ok. Underwent PCI with DES of native LCX. EF was normal.  LV function normal. Remainder of his medical history is notable for chronic chest pain, hypertension, hyperlipidemia, previous stroke with chronic pain and left-sided weakness and numbness, diabetes, obstructive sleep apnea on CPAP.  Over past few months struggling with dizzines and a feeling of dyscoordination and slowing down.  Had a brain MRI which was performed in October of 2011 and revealed  no acute intracranial abnormality; moderate to severe chronic ischemic disease, predominately small vessel.  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 He was also saw ENT and treated for sinusitis without any improvement.   He says he feels like he is falling apart. Very frustrated. There is no associated chest pain, dyspnea or  No problems with memory or falls.   Current Medications (verified): 1)  Aspirin 81 Mg Tbec (Aspirin) .... Take 1 Tablet By Mouth Once A Day 2)  Allopurinol 100 Mg  Tabs (Allopurinol) .... Take 2 By Mouth Once Daily 3)  Plavix 75 Mg  Tabs (Clopidogrel Bisulfate) .... Take 1 Tablet By Mouth Once A Day 4)  Crestor 40 Mg Tabs (Rosuvastatin Calcium) .... Take 1 Tablet By Mouth Once A Day 5)  Protonix 40 Mg  Tbec (Pantoprazole Sodium) .... Take 1 By Mouth Qd 6)  Furosemide 40 Mg  Tabs (Furosemide) .... As Needed 7)  Nitrostat 0.4 Mg  Subl (Nitroglycerin) .Marland Kitchen.. 1 Tablet Under Tongue  Every 5 Minutes As Needed Up  To 3 Doses 8)  Tramadol Hcl 50 Mg Tabs (Tramadol Hcl) .... Take 1 Tablet By Mouth Two Times A Day 9)  Metoprolol Succinate 25 Mg  Xr24h-Tab (Metoprolol Succinate) .... One By Mouth Once Daily 10)  Tizanidine Hcl 2 Mg  Tabs (Tizanidine Hcl) .... Take 1 Tab By Mouth At Bedtime 11)  Levothyroxine Sodium 100 Mcg Tabs (Levothyroxine Sodium) .... One By Mouth Once Daily 12)  Benazepril Hcl 5 Mg Tabs (Benazepril Hcl) .... Take 1 Tablet By Mouth Once A Day. 13)  Tramadol Hcl 100 Mg Xr24h-Tab (Tramadol Hcl) .... One By Mouth Once Daily in Am 14)  Oxygen 2 L/m For Sleep  Allergies (verified): No Known Drug Allergies  Past History:  Past Medical History: Last updated: 02/13/2010 GERD Gout  Hyperlipidemia     Hypertension    CVA- left hemiparesis   Chronic left sided pain- Kirsteins  DM II borderline    Coronary Artery Disease  --s/p CABG  --s/p DES to LCX January 2011 Obstructive sleep apnea on CPAP   Review of Systems       As per HPI and past medical history; otherwise all systems negative.   Vital Signs:  Patient profile:   65 year old male Height:      75 inches Weight:      242 pounds BMI:     30.36 Pulse rate:   64 / minute BP sitting:  124 / 72  (left arm)  Vitals Entered By: Laurance Flatten CMA (February 28, 2010 9:21 AM)  Physical Exam  General:  No acute distress. walks with a limp. no resp difficulty HEENT: normal Neck: supple. no JVD. Carotids 2+ bilat; no bruits. No lymphadenopathy or thryomegaly appreciated. Cor: PMI nondisplaced. Regular rate & rhythm. No rubs, gallops, murmur. Lungs: clear Abdomen: soft, nontender, nondistended. No hepatosplenomegaly. No bruits or masses. Good bowel sounds. Extremities: no cyanosis, clubbing, rash, edema Neuro: alert & orientedx3, cranial nerves grossly intact.    Impression & Recommendations:  Problem # 1:  Dizziness (ICD-780.4)/Dyscoordination Unclear etiology. ?Early Parkinson's. Does not appear cardiac. Had previously  seen Dr. Sandria Manly in Neurology for his stroke. Will refer back.  Problem # 2:  CAD, NATIVE VESSEL (ICD-414.01) Stable. No evidence of ischemia. Continue current regimen.  Problem # 3:  HYPERTENSION (ICD-401.9) Blood pressure well controlled. Continue current regimen.  Other Orders: Neurology Referral (Neuro)  Patient Instructions: 1)  Your physician recommends that you schedule a follow-up appointment in: 6 months 2)  Your physician recommends that you continue on your current medications as directed. Please refer to the Current Medication list given to you today. 3)  You have been referred to Dr. Sandria Manly at [facility]

## 2010-04-18 NOTE — Letter (Signed)
Summary: Certificate of Medical Necessity / [facility]  Certificate of Medical Necessity / [facility]   Imported By: Lennie Odor 03/04/2010 15:53:18  _____________________________________________________________________  External Attachment:    Type:   Image     Comment:   External Document

## 2010-04-24 ENCOUNTER — Encounter: Payer: Self-pay | Admitting: Internal Medicine

## 2010-04-24 NOTE — Letter (Signed)
Summary: Pre Visit Letter Revised  [facility]  [address] [place], [place] [address]   Phone: [phone]  Fax: [phone]        04/17/2010 MRN: [id number] [name] [address] [place], [place]  [address]             Procedure Date:  05-22-10  Welcome to the Gastroenterology Division at [facility].    You are scheduled to see a nurse for your pre-procedure visit on 05-08-10 at 9:00A.M. on the 3rd floor at [facility], [address]  We ask that you try to arrive at our office 15 minutes prior to your appointment time to allow for check-in.  Please take a minute to review the attached form.  If you answer "Yes" to one or more of the questions on the first page, we ask that you call the person listed at your earliest opportunity.  If you answer "No" to all of the questions, please complete the rest of the form and bring it to your appointment.    Your nurse visit will consist of discussing your medical and surgical history, your immediate family medical history, and your medications.   If you are unable to list all of your medications on the form, please bring the medication bottles to your appointment and we will list them.  We will need to be aware of both prescribed and over the counter drugs.  We will need to know exact dosage information as well.    Please be prepared to read and sign documents such as consent forms, a financial agreement, and acknowledgement forms.  If necessary, and with your consent, a friend or relative is welcome to sit-in on the nurse visit with you.  Please bring your insurance card so that we may make a copy of it.  If your insurance requires a referral to see a specialist, please bring your referral form from your primary care physician.  No co-pay is required for this nurse visit.     If you cannot keep your appointment, please call [phone] to cancel or reschedule prior to your appointment date.   This allows [place] the opportunity to schedule an appointment for another patient in need of care.    Thank you for choosing [facility] for your medical needs.  We appreciate the opportunity to care for you.  Please visit [place] at our website  to learn more about our practice.  Sincerely, The Gastroenterology Division

## 2010-05-02 NOTE — Letter (Signed)
Summary: Request for Surgical Clearance/[facility]  Request for Surgical Clearance/[facility]   Imported By: Maryln Gottron 04/25/2010 09:25:18  _____________________________________________________________________  External Attachment:    Type:   Image     Comment:   External Document

## 2010-05-06 ENCOUNTER — Ambulatory Visit: Payer: Medicare Other | Admitting: Physical Medicine & Rehabilitation

## 2010-05-06 ENCOUNTER — Ambulatory Visit: Payer: Self-pay | Admitting: Physical Medicine & Rehabilitation

## 2010-05-06 ENCOUNTER — Encounter: Payer: Medicare Other | Attending: Physical Medicine & Rehabilitation

## 2010-05-06 DIAGNOSIS — I69959 Hemiplegia and hemiparesis following unspecified cerebrovascular disease affecting unspecified side: Secondary | ICD-10-CM | POA: Insufficient documentation

## 2010-05-06 DIAGNOSIS — R209 Unspecified disturbances of skin sensation: Secondary | ICD-10-CM | POA: Insufficient documentation

## 2010-05-06 DIAGNOSIS — G8110 Spastic hemiplegia affecting unspecified side: Secondary | ICD-10-CM

## 2010-05-06 DIAGNOSIS — R42 Dizziness and giddiness: Secondary | ICD-10-CM | POA: Insufficient documentation

## 2010-05-07 ENCOUNTER — Encounter (INDEPENDENT_AMBULATORY_CARE_PROVIDER_SITE_OTHER): Payer: Self-pay | Admitting: *Deleted

## 2010-05-07 ENCOUNTER — Encounter: Payer: Self-pay | Admitting: Gastroenterology

## 2010-05-07 ENCOUNTER — Telehealth (INDEPENDENT_AMBULATORY_CARE_PROVIDER_SITE_OTHER): Payer: Self-pay | Admitting: *Deleted

## 2010-05-08 ENCOUNTER — Encounter: Payer: Self-pay | Admitting: Gastroenterology

## 2010-05-08 NOTE — Miscellaneous (Signed)
Summary: Oxygen Order for 2 to 3 LPM/[facility]  Oxygen Order for 2 to 3 LPM/[facility]   Imported By: Sherian Rein 05/03/2010 08:07:17  _____________________________________________________________________  External Attachment:    Type:   Image     Comment:   External Document

## 2010-05-14 ENCOUNTER — Encounter: Payer: Self-pay | Admitting: Gastroenterology

## 2010-05-14 NOTE — Letter (Addendum)
Summary: Anticoagulation Modification Letter   Gastroenterology  [address] [place], [place] [address]   Phone: [phone]  Fax: [phone]    May 07, 2010  Re:    [name] DOB:    26-Apr-1945 MRN:    [id number]    Dear Dr. Artist Pais,  We have scheduled the above patient for an endoscopic procedure. Our records show that  he/she is on anticoagulation therapy. Please advise as to how long the patient may come off their therapy of Plavix prior to the scheduled procedure(s) on 05/22/10.   Please fax back/or route the completed form to [place] at [phone].  Thank you for your help with this matter.  Sincerely,  Christie Nottingham CMA Duncan Dull)   Physician Recommendation:  Hold Plavix 5 days prior ________________  Hold Coumadin 5 days prior ____________  Other ______________________________     Appended Document: Anticoagulation Modification Letter ok to hold Plavix for 5 days.

## 2010-05-14 NOTE — Miscellaneous (Signed)
Summary: [facility] Previsit/prep  Clinical Lists Changes  Medications: Added new medication of MOVIPREP 100 GM  SOLR (PEG-KCL-NACL-NASULF-NA ASC-C) As per prep instructions. - Signed Rx of MOVIPREP 100 GM  SOLR (PEG-KCL-NACL-NASULF-NA ASC-C) As per prep instructions.;  #1 x 0;  Signed;  Entered by: Wyona Almas RN;  Authorized by: Meryl Dare MD [facility];  Method used: [facility]  [address] [id number] [address] [place], [place]  [address], Ph: [phone] or [phone], Fax: [phone] Observations: Added new observation of NKA: T (05/08/2010 9:52)    Prescriptions: MOVIPREP 100 GM  SOLR (PEG-KCL-NACL-NASULF-NA ASC-C) As per prep instructions.  #1 x 0   Entered by:   Wyona Almas RN   Authorized by:   Meryl Dare MD [facility]   Signed by:   Wyona Almas RN on 05/08/2010   Method used:   [facility]        [facility]  [address] [id number]       [address]       [place], [place]  [address]       Ph: [phone] or [phone]       Fax: [phone]   RxID:   [id number]

## 2010-05-14 NOTE — Progress Notes (Signed)
Summary: Pt. on Plavix   Phone Note Outgoing Call   Call placed by: Almyra Brace Call placed to: Patient Details for Reason: PLAVIX Summary of Call: Mr. Passage is on 75mg  Plavix daily.  He's for a recall colon 05/22/10.  Please advise whether he needs an office visit.  He said he's taking the Plavix for a stroke he had "years ago". Initial call taken by: Wyona Almas RN,  May 07, 2010 2:57 PM  Follow-up for Phone Call        We need a letter clearing [place] to hold Plavix for 5 days from his precribing MD. If we can get this very soon we can go ahead with a direct colon off Plavix for 5 days. Follow-up by: Meryl Dare MD Clementeen Graham,  May 07, 2010 3:04 PM  Additional Follow-up for Phone Call Additional follow up Details #1::        Marchelle Folks, I'm forwarding this to you to send for Mr. Schlabach Plavix release form from Dr. Artist Pais.  Thanks Additional Follow-up by: Wyona Almas RN,  May 07, 2010 3:23 PM    Additional Follow-up for Phone Call Additional follow up Details #2::    Letter send to Dr. Olegario Messier office to fill out and fax back before procedure.  Follow-up by: Christie Nottingham CMA Duncan Dull),  May 07, 2010 4:02 PM

## 2010-05-14 NOTE — Letter (Signed)
Summary: [facility] Instructions  [facility]  [address] [place], [place] [address]   Phone: [phone]  Fax: [phone]       [name]    01-22-1946    MRN: [id number]        Procedure Day Dorna Bloom:  [facility]  05/22/10     Arrival Time:  9:30AM     Procedure Time:  10:30AM     Location of Procedure:                    _ X_   Endoscopy Center (4th Floor)                      PREPARATION FOR COLONOSCOPY WITH MOVIPREP   Starting 5 days prior to your procedure 05/17/10 do not eat nuts, seeds, popcorn, corn, beans, peas,  salads, or any raw vegetables.  Do not take any fiber supplements (e.g. Metamucil, Citrucel, and Benefiber).  THE DAY BEFORE YOUR PROCEDURE         DATE: 05/21/10  DAY: TUESDAY  1.  Drink clear liquids the entire day-NO SOLID FOOD  2.  Do not drink anything colored red or purple.  Avoid juices with pulp.  No orange juice.  3.  Drink at least 64 oz. (8 glasses) of fluid/clear liquids during the day to prevent dehydration and help the prep work efficiently.  CLEAR LIQUIDS INCLUDE: Water Jello Ice Popsicles Tea (sugar ok, no milk/cream) Powdered fruit flavored drinks Coffee (sugar ok, no milk/cream) Gatorade Juice: apple, white grape, white cranberry  Lemonade Clear bullion, consomm, broth Carbonated beverages (any kind) Strained chicken noodle soup Hard Candy                             4.  In the morning, mix first dose of MoviPrep solution:    Empty 1 Pouch A and 1 Pouch B into the disposable container    Add lukewarm drinking water to the top line of the container. Mix to dissolve    Refrigerate (mixed solution should be used within 24 hrs)  5.  Begin drinking the prep at 5:00 p.m. The MoviPrep container is divided by 4 marks.   Every 15 minutes drink the solution down to the next mark (approximately 8 oz) until the full liter is complete.   6.  Follow completed prep with 16 oz of clear liquid of your choice (Nothing  red or purple).  Continue to drink clear liquids until bedtime.  7.  Before going to bed, mix second dose of MoviPrep solution:    Empty 1 Pouch A and 1 Pouch B into the disposable container    Add lukewarm drinking water to the top line of the container. Mix to dissolve    Refrigerate  THE DAY OF YOUR PROCEDURE      DATE: 05/22/10  DAY: WEDNESDAY  Beginning at 5:30AM (5 hours before procedure):         1. Every 15 minutes, drink the solution down to the next mark (approx 8 oz) until the full liter is complete.  2. Follow completed prep with 16 oz. of clear liquid of your choice.    3. You may drink clear liquids until 8:30AM (2 HOURS BEFORE PROCEDURE).   MEDICATION INSTRUCTIONS  Unless otherwise instructed, you should take regular prescription medications with a small sip of water   as early as possible the morning of your  procedure.    Stop taking Plavix  on  05/17/10  (5 days before procedure).       Additional medication instructions: Do not take any Furosemide the morning of your procedure.         OTHER INSTRUCTIONS  You will need a responsible adult at least 67 years of age to accompany you and drive you home.   This person must remain in the waiting room during your procedure.  Wear loose fitting clothing that is easily removed.  Leave jewelry and other valuables at home.  However, you may wish to bring a book to read or  an iPod/MP3 player to listen to music as you wait for your procedure to start.  Remove all body piercing jewelry and leave at home.  Total time from sign-in until discharge is approximately 2-3 hours.  You should go home directly after your procedure and rest.  You can resume normal activities the  day after your procedure.  The day of your procedure you should not:   Drive   Make legal decisions   Operate machinery   Drink alcohol   Return to work  You will receive specific instructions about eating, activities and medications  before you leave.    The above instructions have been reviewed and explained to me by   Wyona Almas RN  May 08, 2010 10:22 AM     I fully understand and can verbalize these instructions _____________________________ Date _________

## 2010-05-16 ENCOUNTER — Telehealth: Payer: Self-pay | Admitting: Internal Medicine

## 2010-05-16 DIAGNOSIS — D126 Benign neoplasm of colon, unspecified: Secondary | ICD-10-CM

## 2010-05-16 HISTORY — PX: CATARACT EXTRACTION: SUR2

## 2010-05-16 HISTORY — DX: Benign neoplasm of colon, unspecified: D12.6

## 2010-05-22 ENCOUNTER — Other Ambulatory Visit: Payer: Self-pay | Admitting: Gastroenterology

## 2010-05-22 ENCOUNTER — Other Ambulatory Visit (AMBULATORY_SURGERY_CENTER): Payer: Medicare Other | Admitting: Gastroenterology

## 2010-05-22 DIAGNOSIS — D126 Benign neoplasm of colon, unspecified: Secondary | ICD-10-CM

## 2010-05-22 DIAGNOSIS — Z1211 Encounter for screening for malignant neoplasm of colon: Secondary | ICD-10-CM

## 2010-05-22 DIAGNOSIS — K648 Other hemorrhoids: Secondary | ICD-10-CM

## 2010-05-23 NOTE — Letter (Signed)
Summary: Anticoagulation Modification Letter  [facility]  [address] [place], [place] [address]   Phone: [phone]  Fax: [phone]    May 14, 2010  Re:    [name] DOB:    1945/03/28 MRN:    [id number]    Dear Dr. Gala Romney,  We have scheduled the above patient for an endoscopic procedure. Our records show that  he/she is on anticoagulation therapy. Please advise as to how long the patient may come off their therapy of Plavix prior to the scheduled procedure(s) on 05/22/10.   Please fax back/or route the completed form to [facility] at [phone].  Thank you for your help with this matter.  Sincerely,  Christie Nottingham CMA Duncan Dull)   Physician Recommendation:  Hold Plavix 7 days prior ________________  Hold Coumadin 5 days prior ____________  Other ______________________________     Appended Document: Anticoagulation Modification Letter Pt notified to stop Plavix 5 days before his procedure per Dr. Tenny Craw from [facility]. Pt verbalized understanding.

## 2010-05-23 NOTE — Progress Notes (Addendum)
Summary: stop plavix    Phone Note From Other Clinic Call back at [facility] Phone [phone]   Caller: stephanine from gi dept [phone].  Request: Talk with Nurse Summary of Call: pt need to stop plavix today regarding procedure on 3/7.  Initial call taken by: Lorne Skeens,  May 16, 2010 8:34 AM  Follow-up for Phone Call        Pt. said he is scheduled for a colonoscopy on 05/22/10. He needs to be off Plavix for 7 days prior the procedure. I let pt. know a request to aprove for him to be off plavix was send from GI on 05/14/10 . Dr. Gala Romney  needs to okay for him to be off Plavix. I will send this message to the MD and his nurse. Pt. verbalized understanding. Follow-up by: Ollen Gross, RN, BSN,  May 16, 2010 11:17 AM  Additional Follow-up for Phone Call Additional follow up Details #1::        Marchelle Folks from GI dept called looking to stop Plavix prior to colonoscopy. Patient has a history of polyps. No acute problem. She would like me to ask DOD if OK to stop Plavix 1 week prior. Dr.Bensimhon is not in the office today. Wil ask Dr.Jehu Mccauslin and call her back at ex. [phone]  Layne Benton, RN, BSN  May 16, 2010 11:35 AM      Additional Follow-up for Phone Call Additional follow up Details #2::    OK to stop plavix as intervention was January 2011.  Resume after procedure. Follow-up by: Sherrill Raring, MD, [facility],  May 16, 2010 11:59 AM   Appended Document: stop plavix  Rondall Allegra in GI dept and she is aware.  Appended Document: stop plavix  agree. Ok to stop Plvix and resume after colonoscopy.

## 2010-05-28 NOTE — Procedures (Addendum)
Summary: Colonoscopy  Patient: [name] Note: All result statuses are Final unless otherwise noted.  Tests: (1) Colonoscopy (COL)   COL Colonoscopy           DONE      Endoscopy Center     [address]     [place], [place]  [address]          COLONOSCOPY PROCEDURE REPORT          PATIENT:  [name], [name]  MR#:  [id number]     BIRTHDATE:  01/07/1946, 65 yrs. old  GENDER:  male     ENDOSCOPIST:  Judie Petit T. Russella Dar, MD, [facility]          PROCEDURE DATE:  05/22/2010     PROCEDURE:  Colonoscopy with snare polypectomy     ASA CLASS:  Class II     INDICATIONS:  1) Routine Risk Screening     MEDICATIONS:   Fentanyl 75 mcg IV, Versed 9 mg IV     DESCRIPTION OF PROCEDURE:   After the risks benefits and     alternatives of the procedure were thoroughly explained, informed     consent was obtained.  Digital rectal exam was performed and     revealed no abnormalities.  The LB [id number]-[id number] endoscope     was introduced through the anus and advanced to the cecum, which     was identified by both the appendix and ileocecal valve, without     limitations.  The quality of the prep was good, using MoviPrep.     The instrument was then slowly withdrawn as the colon was fully     examined.     <<PROCEDUREIMAGES>>     FINDINGS:  A sessile polyp was found in the descending colon. It     was 6 mm in size. Polyp was snared without cautery. Retrieval was     successful.  A normal appearing cecum, ileocecal valve, and     appendiceal orifice were identified. The ascending, hepatic     flexure, transverse, splenic flexure, sigmoid colon, and rectum     appeared unremarkable. Retroflexed views in the rectum revealed     internal hemorrhoids. small. The time to cecum =  3.75  minutes.     The scope was then withdrawn (time =  14.75  min) from the patient     and the procedure completed.          COMPLICATIONS:  None          ENDOSCOPIC IMPRESSION:     1) 6 mm sessile polyp in the  descending colon     2) Internal hemorrhoids          RECOMMENDATIONS:     1) Await pathology results     2) If the polyp is adenomatous (pre-cancerous), colonoscopy in 5     years. Otherwise follow colorectal cancer screening guidelines for     "routine risk" patients with colonoscopy in 10 years.          Venita Lick. Russella Dar, MD, Clementeen Graham          CC:  Thomos Lemons, DO          n.     eSIGNED:   Venita Lick. Nabil Bubolz at 05/22/2010 11:05 AM          [name], [id number]  Note: An exclamation mark (!) indicates a result that was not dispersed into the flowsheet. Document Creation Date: 05/22/2010 11:05 AM _______________________________________________________________________  (  1) Order result status: Final Collection or observation date-time: 05/22/2010 11:00 Requested date-time:  Receipt date-time:  Reported date-time:  Referring Physician:   Ordering Physician: Claudette Head [phone]) Specimen Source:  Source: Launa Grill Order Number: [id number] Lab site:   Appended Document: Colonoscopy     Procedures Next Due Date:    Colonoscopy: 05/2015

## 2010-05-28 NOTE — Progress Notes (Signed)
Summary: refill-- metformin?  Phone Note Refill Request Message from:  Fax from [facility] [address] on May 16, 2010 11:37 AM  Refills Requested: Medication #1:  Metformin 500mg  Take 1 tablet twice a day   Last Refilled: 12/2009 Pt was taken off med in October 2011. Is he supposed to be taking it now?  Next Appointment Scheduled: none with Dr Artist Pais Initial call taken by: Mervin Kung CMA Duncan Dull),  May 16, 2010 11:36 AM  Follow-up for Phone Call        I suggest patient not restart metformin until GI workup completed Follow-up by: D. Thomos Lemons DO,  May 16, 2010 3:38 PM  Additional Follow-up for Phone Call Additional follow up Details #1::        Left message on machine to return my call. Nicki Guadalajara Fergerson CMA Duncan Dull)  May 16, 2010 4:24 PM     Additional Follow-up for Phone Call Additional follow up Details #2::    Pt notified. Nicki Guadalajara Fergerson CMA (AAMA)  May 20, 2010 11:00 AM

## 2010-06-02 ENCOUNTER — Encounter: Payer: Self-pay | Admitting: Gastroenterology

## 2010-06-02 LAB — COMPREHENSIVE METABOLIC PANEL
ALT: 18 U/L (ref 0–53)
AST: 21 U/L (ref 0–37)
Albumin: 3.1 g/dL — ABNORMAL LOW (ref 3.5–5.2)
Alkaline Phosphatase: 38 U/L — ABNORMAL LOW (ref 39–117)
CO2: 29 mEq/L (ref 19–32)
Chloride: 104 mEq/L (ref 96–112)
Creatinine, Ser: 0.96 mg/dL (ref 0.4–1.5)
GFR calc Af Amer: >60 mL/min (ref >60)
GFR calc non Af Amer: >60 mL/min (ref >60)
Potassium: 3.5 mEq/L (ref 3.5–5.1)
Total Bilirubin: 0.7 mg/dL (ref 0.3–1.2)

## 2010-06-02 LAB — CBC
HCT: 36.1 % — ABNORMAL LOW (ref 39.0–52.0)
HCT: 37.5 % — ABNORMAL LOW (ref 39.0–52.0)
Hemoglobin: 12 g/dL — ABNORMAL LOW (ref 13.0–17.0)
Hemoglobin: 12.1 g/dL — ABNORMAL LOW (ref 13.0–17.0)
Hemoglobin: 12.2 g/dL — ABNORMAL LOW (ref 13.0–17.0)
MCHC: 33.5 g/dL (ref 30.0–36.0)
MCHC: 34.2 g/dL (ref 30.0–36.0)
MCHC: 34.4 g/dL (ref 30.0–36.0)
MCV: 91.1 fL (ref 78.0–100.0)
MCV: 91.5 fL (ref 78.0–100.0)
Platelets: 112 10*3/uL — ABNORMAL LOW (ref 150–400)
Platelets: 133 10*3/uL — ABNORMAL LOW (ref 150–400)
RBC: 3.72 MIL/uL — ABNORMAL LOW (ref 4.22–5.81)
RBC: 3.9 MIL/uL — ABNORMAL LOW (ref 4.22–5.81)
RBC: 3.97 MIL/uL — ABNORMAL LOW (ref 4.22–5.81)
RDW: 13.3 % (ref 11.5–15.5)
RDW: 13.7 % (ref 11.5–15.5)
WBC: 6 10*3/uL (ref 4.0–10.5)
WBC: 6.5 10*3/uL (ref 4.0–10.5)
WBC: 6.7 10*3/uL (ref 4.0–10.5)

## 2010-06-02 LAB — BASIC METABOLIC PANEL
BUN: 16 mg/dL (ref 6–23)
CO2: 30 mEq/L (ref 19–32)
Calcium: 8.6 mg/dL (ref 8.4–10.5)
Calcium: 8.7 mg/dL (ref 8.4–10.5)
Chloride: 103 mEq/L (ref 96–112)
Chloride: 106 mEq/L (ref 96–112)
Creatinine, Ser: 1.01 mg/dL (ref 0.4–1.5)
Creatinine, Ser: 1.2 mg/dL (ref 0.4–1.5)
GFR calc Af Amer: >60 mL/min (ref >60)
GFR calc Af Amer: >60 mL/min (ref >60)
GFR calc Af Amer: >60 mL/min (ref >60)
GFR calc non Af Amer: >60 mL/min (ref >60)
GFR calc non Af Amer: >60 mL/min (ref >60)
GFR calc non Af Amer: >60 mL/min (ref >60)
Glucose, Bld: 121 mg/dL — ABNORMAL HIGH (ref 70–99)
Glucose, Bld: 91 mg/dL (ref 70–99)
Potassium: 4.2 mEq/L (ref 3.5–5.1)
Potassium: 4.7 mEq/L (ref 3.5–5.1)
Sodium: 138 mEq/L (ref 135–145)
Sodium: 138 mEq/L (ref 135–145)

## 2010-06-02 LAB — GLUCOSE, CAPILLARY
Glucose-Capillary: 107 mg/dL — ABNORMAL HIGH (ref 70–99)
Glucose-Capillary: 110 mg/dL — ABNORMAL HIGH (ref 70–99)
Glucose-Capillary: 66 mg/dL — ABNORMAL LOW (ref 70–99)
Glucose-Capillary: 89 mg/dL (ref 70–99)
Glucose-Capillary: 93 mg/dL (ref 70–99)
Glucose-Capillary: 95 mg/dL (ref 70–99)
Glucose-Capillary: 97 mg/dL (ref 70–99)
Glucose-Capillary: 97 mg/dL (ref 70–99)
Glucose-Capillary: 98 mg/dL (ref 70–99)

## 2010-06-02 LAB — HEPARIN LEVEL (UNFRACTIONATED)
Heparin Unfractionated: 0.31 IU/mL (ref 0.30–0.70)
Heparin Unfractionated: 0.39 IU/mL (ref 0.30–0.70)

## 2010-06-02 LAB — APTT: aPTT: 29 seconds (ref 24–37)

## 2010-06-02 LAB — CARDIAC PANEL(CRET KIN+CKTOT+MB+TROPI): Relative Index: 3.4 — ABNORMAL HIGH (ref 0.0–2.5)

## 2010-06-02 LAB — D-DIMER, QUANTITATIVE: D-Dimer, Quant: <0.22 ug/mL-FEU (ref 0.00–0.48)

## 2010-06-07 ENCOUNTER — Encounter: Payer: Self-pay | Admitting: Internal Medicine

## 2010-06-07 ENCOUNTER — Ambulatory Visit (INDEPENDENT_AMBULATORY_CARE_PROVIDER_SITE_OTHER): Payer: Medicare Other | Admitting: Internal Medicine

## 2010-06-07 VITALS — BP 138/72 | HR 61 | Ht 75.0 in | Wt 264.8 lb

## 2010-06-07 DIAGNOSIS — G4733 Obstructive sleep apnea (adult) (pediatric): Secondary | ICD-10-CM

## 2010-06-07 NOTE — Progress Notes (Signed)
  Subjective:    Patient ID: [name], male    DOB: 1945/08/08, 65 y.o.   MRN: [id number]  HPI 66 yoM followed for obstructive sleep apnea off CPAP and on O2 at 2L/M at night. He feels fine with that. He is not being told that he snores or stops breathing much in sleep. He is seeing Dr Sandria Manly Neurology for complaints of unsteady, lightheaded, vertigo. Has had cataract surgery since last here.    Review of Systems Constitutional:   No weight loss, night sweats,  Fevers, chills, fatigue, lassitude. HEENT:   No headaches,  Difficulty swallowing,  Tooth/dental problems,  Sore throat,                No sneezing, itching, ear ache, nasal congestion, post nasal drip,    See HPI re dizzy/ lightheaded.   CV:  No chest pain,  Orthopnea, PND, swelling in lower extremities, anasarca, dizziness, palpitations  GI  No heartburn, indigestion, abdominal pain, nausea, vomiting, diarrhea, change in bowel habits, loss of appetite  Resp: No shortness of breath with exertion or at rest.  No excess mucus, no productive cough,  No non-productive cough,  No coughing up of blood.  No change in color of mucus.  No wheezing.  No chest wall deformity  Skin: no rash or lesions.  GU: no dysuria, change in color of urine, no urgency or frequency.  No flank pain.  MS:  No joint pain or swelling.  No decreased range of motion.  No back pain.  Psych:  No change in mood or affect. No depression or anxiety.  No memory loss.     Objective:   Physical Exam    General- Alert, Oriented, Affect-appropriate, Distress- none acute  Skin- rash-none, lesions- none, excoriation- none  Lymphadenopathy- none  Head- atraumatic  Eyes- Gross vision intact, PERRLA, conjunctivae clear, secretions  Ears- Normal- Hearing, canals, Tm L ,   R ,  Nose- Clear, Septal dev, mucus, polyps, erosion, perforation   Throat- Mallampati III , mucosa clear , drainage- none, tonsils- atrophic  Neck- flexible , trachea midline, no  stridor , thyroid nl, carotid no bruit  Chest - symmetrical excursion , unlabored     Heart/CV- RRR , no murmur , no gallop  , no rub, nl s1 s2                     - JVD- none , edema- none, stasis changes- none, varices- none     Lung- clear to P&A, wheeze- none, cough- none , dullness-none, rub- none     Chest wall- atraumatic, no scar  Abd- tender-no, distended-no, bowel sounds-present, HSM- no  Br/ Gen/ Rectal- Not done, not indicated  Extrem- cyanosis- none, clubbing, none, atrophy- none, strength- nl  Neuro- Left hemiparesis     Assessment & Plan:

## 2010-06-07 NOTE — Assessment & Plan Note (Signed)
He seems to be doing well, protected from significant sleep related O2 desaturation and able to cope much better with O2 than with CPAP previously tried. Daytime breathing seems good. His old CVA is stable, but of uncertain contribution to his OSA.

## 2010-06-07 NOTE — Patient Instructions (Signed)
Continue Oxygen at night at 2 L/m  Please call as needed

## 2010-06-09 LAB — DIFFERENTIAL
Basophils Absolute: 0 10*3/uL (ref 0.0–0.1)
Basophils Relative: 1 % (ref 0–1)
Basophils Relative: 1 % (ref 0–1)
Eosinophils Absolute: 0 10*3/uL (ref 0.0–0.7)
Eosinophils Absolute: 0.3 10*3/uL (ref 0.0–0.7)
Eosinophils Relative: 7 % — ABNORMAL HIGH (ref 0–5)
Monocytes Absolute: 0.5 10*3/uL (ref 0.1–1.0)
Neutro Abs: 1.6 10*3/uL — ABNORMAL LOW (ref 1.7–7.7)
Neutrophils Relative %: 68 % (ref 43–77)

## 2010-06-09 LAB — COMPREHENSIVE METABOLIC PANEL
ALT: 26 U/L (ref 0–53)
Albumin: 2.9 g/dL — ABNORMAL LOW (ref 3.5–5.2)
Alkaline Phosphatase: 29 U/L — ABNORMAL LOW (ref 39–117)
Alkaline Phosphatase: 32 U/L — ABNORMAL LOW (ref 39–117)
BUN: 24 mg/dL — ABNORMAL HIGH (ref 6–23)
BUN: 24 mg/dL — ABNORMAL HIGH (ref 6–23)
CO2: 30 mEq/L (ref 19–32)
Calcium: 9.1 mg/dL (ref 8.4–10.5)
Chloride: 97 mEq/L (ref 96–112)
GFR calc non Af Amer: 48 mL/min — ABNORMAL LOW (ref >60)
Glucose, Bld: 108 mg/dL — ABNORMAL HIGH (ref 70–99)
Potassium: 3.3 mEq/L — ABNORMAL LOW (ref 3.5–5.1)
Potassium: 3.9 mEq/L (ref 3.5–5.1)
Sodium: 138 mEq/L (ref 135–145)
Total Bilirubin: 0.5 mg/dL (ref 0.3–1.2)

## 2010-06-09 LAB — BASIC METABOLIC PANEL
BUN: 17 mg/dL (ref 6–23)
CO2: 30 mEq/L (ref 19–32)
Chloride: 106 mEq/L (ref 96–112)
Glucose, Bld: 107 mg/dL — ABNORMAL HIGH (ref 70–99)
Potassium: 3.7 mEq/L (ref 3.5–5.1)

## 2010-06-09 LAB — LIPID PANEL
Cholesterol: 84 mg/dL (ref 0–200)
HDL: 44 mg/dL (ref >39)
LDL Cholesterol: 26 mg/dL (ref 0–99)
Total CHOL/HDL Ratio: 1.9 RATIO
Triglycerides: 72 mg/dL (ref <150)

## 2010-06-09 LAB — CBC
HCT: 35.1 % — ABNORMAL LOW (ref 39.0–52.0)
HCT: 39.3 % (ref 39.0–52.0)
Hemoglobin: 12.1 g/dL — ABNORMAL LOW (ref 13.0–17.0)
Hemoglobin: 13.4 g/dL (ref 13.0–17.0)
MCHC: 34.1 g/dL (ref 30.0–36.0)
Platelets: 102 10*3/uL — ABNORMAL LOW (ref 150–400)
RBC: 4.34 MIL/uL (ref 4.22–5.81)
WBC: 3.6 10*3/uL — ABNORMAL LOW (ref 4.0–10.5)

## 2010-06-09 LAB — URINALYSIS, ROUTINE W REFLEX MICROSCOPIC
Leukocytes, UA: NEGATIVE
Nitrite: NEGATIVE
Protein, ur: 30 mg/dL — AB
Urobilinogen, UA: 0.2 mg/dL (ref 0.0–1.0)

## 2010-06-09 LAB — URINE MICROSCOPIC-ADD ON

## 2010-06-09 LAB — HEPATITIS PANEL, ACUTE: HCV Ab: NEGATIVE

## 2010-06-09 LAB — URINE CULTURE

## 2010-06-13 ENCOUNTER — Telehealth (INDEPENDENT_AMBULATORY_CARE_PROVIDER_SITE_OTHER): Payer: Medicare Other | Admitting: Internal Medicine

## 2010-06-13 DIAGNOSIS — I10 Essential (primary) hypertension: Secondary | ICD-10-CM

## 2010-06-13 NOTE — Letter (Signed)
Summary: Patient Notice- Polyp Results  [facility]  [address] [place], [place] [address]   Phone: [phone]  Fax: [phone]        June 02, 2010 MRN: [id number]    KEYLIN PODOLSKY [address] [place], [place]  [address]    Dear Mr. Diperna,  I am pleased to inform you that the colon polyp(s) removed during your recent colonoscopy was (were) found to be benign (no cancer detected) upon pathologic examination.  I recommend you have a repeat colonoscopy examination in 5 years to look for recurrent polyps, as having colon polyps increases your risk for having recurrent polyps or even colon cancer in the future.  Should you develop new or worsening symptoms of abdominal pain, bowel habit changes or bleeding from the rectum or bowels, please schedule an evaluation with either your primary care physician or with me.  Continue treatment plan as outlined the day of your exam.  Please call us if you are having persistent problems or have questions about your condition that have not been fully answered at this time.  Sincerely,  Meryl Dare MD [facility]  This letter has been electronically signed by your physician.  Appended Document: Patient Notice- Polyp Results letter mailed

## 2010-06-13 NOTE — Telephone Encounter (Signed)
Refill- benazepril hcl 5mg  tablet. One by mouth once daily. Qty 90. Last fill 12.29.11

## 2010-06-14 MED ORDER — BENAZEPRIL HCL 5 MG PO TABS: 5.0000 mg | ORAL_TABLET | Freq: Every day | ORAL | Status: DC

## 2010-06-14 NOTE — Telephone Encounter (Signed)
rx refill sent to pharmacy 

## 2010-06-17 LAB — BASIC METABOLIC PANEL
BUN: 14 mg/dL (ref 6–23)
CO2: 29 mEq/L (ref 19–32)
Chloride: 104 mEq/L (ref 96–112)
Creatinine, Ser: 0.88 mg/dL (ref 0.4–1.5)
Potassium: 4.1 mEq/L (ref 3.5–5.1)

## 2010-06-17 LAB — CBC
HCT: 36.6 % — ABNORMAL LOW (ref 39.0–52.0)
MCHC: 34.2 g/dL (ref 30.0–36.0)
MCV: 90.7 fL (ref 78.0–100.0)
Platelets: 136 10*3/uL — ABNORMAL LOW (ref 150–400)

## 2010-06-17 LAB — DIFFERENTIAL
Basophils Relative: 1 % (ref 0–1)
Eosinophils Absolute: 0.5 10*3/uL (ref 0.0–0.7)
Lymphs Abs: 1.3 10*3/uL (ref 0.7–4.0)
Monocytes Absolute: 0.5 10*3/uL (ref 0.1–1.0)
Monocytes Relative: 7 % (ref 3–12)
Neutro Abs: 4.1 10*3/uL (ref 1.7–7.7)

## 2010-06-17 LAB — HEPARIN LEVEL (UNFRACTIONATED): Heparin Unfractionated: 0.26 IU/mL — ABNORMAL LOW (ref 0.30–0.70)

## 2010-06-17 LAB — CARDIAC PANEL(CRET KIN+CKTOT+MB+TROPI): Total CK: 142 U/L (ref 7–232)

## 2010-06-17 LAB — CK TOTAL AND CKMB (NOT AT ARMC): Total CK: 186 U/L (ref 7–232)

## 2010-06-17 LAB — TROPONIN I: Troponin I: 0.02 ng/mL (ref 0.00–0.06)

## 2010-06-17 LAB — PROTIME-INR: Prothrombin Time: 13.1 seconds (ref 11.6–15.2)

## 2010-06-17 LAB — LIPASE, BLOOD: Lipase: 25 U/L (ref 11–59)

## 2010-06-24 ENCOUNTER — Telehealth (INDEPENDENT_AMBULATORY_CARE_PROVIDER_SITE_OTHER): Payer: Medicare Other | Admitting: Internal Medicine

## 2010-06-24 DIAGNOSIS — I10 Essential (primary) hypertension: Secondary | ICD-10-CM

## 2010-06-24 MED ORDER — METOPROLOL SUCCINATE ER 25 MG PO TB24: 25.0000 mg | ORAL_TABLET | Freq: Every day | ORAL | Status: DC

## 2010-06-24 NOTE — Telephone Encounter (Signed)
Refill- metoprolol succ er 25mg  tab. Every day. Qty 90. Last fill 12.24.11

## 2010-06-24 NOTE — Telephone Encounter (Signed)
Patient called to check on status of medication refill.Medication refill for metoprolol sent to pharmacy, patient advised follow up due at time of next refill, he has verbalized understanding.

## 2010-07-01 ENCOUNTER — Other Ambulatory Visit: Payer: Self-pay | Admitting: Neurology

## 2010-07-01 ENCOUNTER — Ambulatory Visit
Admission: RE | Admit: 2010-07-01 | Discharge: 2010-07-01 | Disposition: A | Discharge status: Home / Self Care | Payer: Medicare Other | Source: Ambulatory Visit | Attending: Neurology | Admitting: Neurology

## 2010-07-01 DIAGNOSIS — IMO0002 Reserved for concepts with insufficient information to code with codable children: Secondary | ICD-10-CM

## 2010-07-02 ENCOUNTER — Encounter: Payer: Medicare Other | Attending: Physical Medicine & Rehabilitation

## 2010-07-02 ENCOUNTER — Ambulatory Visit: Payer: Medicare Other | Admitting: Physical Medicine & Rehabilitation

## 2010-07-02 DIAGNOSIS — R209 Unspecified disturbances of skin sensation: Secondary | ICD-10-CM | POA: Insufficient documentation

## 2010-07-02 DIAGNOSIS — I69959 Hemiplegia and hemiparesis following unspecified cerebrovascular disease affecting unspecified side: Secondary | ICD-10-CM | POA: Insufficient documentation

## 2010-07-02 DIAGNOSIS — R42 Dizziness and giddiness: Secondary | ICD-10-CM | POA: Insufficient documentation

## 2010-07-02 DIAGNOSIS — G8110 Spastic hemiplegia affecting unspecified side: Secondary | ICD-10-CM

## 2010-07-03 NOTE — Assessment & Plan Note (Signed)
REASON FOR VISIT:  Left upper extremity spasticity as well as left lower extremity weakness and spasticity.  HISTORY:  A 65 year old male with prior stroke causing left spastic hemiplegia.  He has had botulinum toxin injections for left upper extremity and left lower extremity spasticity, last one performed February 2012.  In the interval time, he has been getting some additional workup for his vertigo by Dr. Sandria Manly.  In addition, he noticed at Dr. Imagene Gurney office that the[employer] program is looking for additional study patient's and he we will begin some additional information on this.  He has had no new medical problems in the interval time.  He does have some pain on the left side, stroke related.  He has been chronically taking tramadol.  SOCIAL HISTORY:  Married, lives with his wife.  Nonsmoker, nondrinker.  PHYSICAL EXAMINATION:  VITAL SIGNS:  Blood pressure 129/66, pulse 67, respirations 18, O2 sat 97% on room air. MUSCULOSKELETAL:  Left upper extremity 3- in the deltoid biceps, triceps, and grip.  Left lower extremity 3- in the ankle dorsiflexor, 4- in the hip flexion, knee extensor.  He has extensor tone primarily in the left lower extremity and flexor tone in the left upper extremity. He is up to Ashworth grade 2 in his upper extremity and 2-3 in his lower extremity in the hamstring.  Gait stiff legged, has some problems with left foot clearance, widened base support.  IMPRESSION:  Chronic left spastic hemiplegia.  He will be ready for another Botox injection of the month.  We discussed in detail that the [employer] program may have some restrictions in terms of Botox, I am not aware, but he will need to check with the study coordinator.  If so may need to just do the upper extremity Botox in one months' rather than upper and lower.     [name], M.D. Electronically Signed    AEK/MedQ D:  07/02/2010 11:59:06  T:  07/02/2010 23:55:20  Job #:  [id number]  cc:    Barbette Hair. Artist Pais, DO [address] [place], [place] [address]  Pramod P. Pearlean Brownie, MD Fax: [phone]

## 2010-07-24 ENCOUNTER — Encounter: Payer: Self-pay | Admitting: Internal Medicine

## 2010-07-25 ENCOUNTER — Telehealth (INDEPENDENT_AMBULATORY_CARE_PROVIDER_SITE_OTHER): Payer: 59 | Admitting: Internal Medicine

## 2010-07-25 DIAGNOSIS — E039 Hypothyroidism, unspecified: Secondary | ICD-10-CM

## 2010-07-25 NOTE — Telephone Encounter (Signed)
90 DAY SUPPLY OF LEVOTHYROXIN  [address] [facility] [address] [place].  PATIENT IS OUT OF MEDS

## 2010-07-26 MED ORDER — LEVOTHYROXINE SODIUM 100 MCG PO TABS: 100.0000 ug | ORAL_TABLET | Freq: Every day | ORAL | Status: DC

## 2010-07-26 NOTE — Telephone Encounter (Signed)
Call placed to patient at [phone], no answer.  A detailed voice message was left for patient informing him he is due for follow up appointment, and a 30 day of requested medication would be sent to pharmacy. Voice message was left for patient to call back to schedule follow up appointment with Dr Artist Pais

## 2010-07-30 NOTE — Assessment & Plan Note (Signed)
REASON FOR VISIT:  Followup of left upper extremity spasms and left  lower extremity spasms.   Total of 18% indicating a minimal disability.   Average pain is 5/10.  He has some new symptoms in the left lower  extremity, which include his fingers spreading out towards the evening  hours around 8 p.m.  This is painful.  He thinks that making a fist  sometimes helps with the pain, but sometimes does not.  He is able to  climb steps.  He is able to drive.   MEDICATIONS:  Tramadol 50 mg t.i.d.  He thinks his pharmacy shorted him  a few and this caused some problems.  He thinks he has it straightened  out.   PAST MEDICAL HISTORY:  Not changed in the interval time.  His last  phenol injection in the musculocutaneous nerve was August 16, 2007, which  was helpful on reducing his biceps spasticity.  He does not have as much  of ratcheting in the left elbow.  He is still doing well in regards to  his left tibial phenol nerve block neurolysis, July 12, 2007, and his  Botox injection in left forearm on July 12, 2007.   PHYSICAL EXAMINATION:  GENERAL:  No acute distress.  Gait shows a partial steppage gait on the left side.  No evidence of toe  drag or knee instability, although he has abnormal shoe wear on the toe  of his left shoe.  He has tried AFO, does not like it, in the past.  Left upper extremity has Ashworth scale 2 spasticity in the elbow  flexors, wrist flexors, and the finger flexors.  He has no hyperalgesia  or any swelling in the hand.  His grip strength is 4-, biceps and  triceps are at a 3+ to 4- minus range, left extremity is 4 in the hip  flexors, knee extensors, and 3+ left ankle dorsiflexors, 5/5 on the  right side.   IMPRESSION:  1. Left hand dystonia, intermittent towards the evening hours, part of      upper motor neuron syndrome.  2. Spasticity, relatively well controlled with current regimen.  3. 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   PLAN:  We  will add some Zanaflex 2 mg nightly.  He can actually take it  a bit earlier maybe 7 or 8 p.m. given his rather predictable onset of  hand dystonias toward the evening hours.   I will see him back in about 2 months.      [name], M.D.  Electronically Signed     AEK/MedQ  D:  10/01/2007 09:54:39  T:  10/02/2007 01:25:19  Job #:  [id number]

## 2010-07-30 NOTE — Procedures (Signed)
[name], [name]               ACCOUNT NO.:  [id number]   MEDICAL RECORD NO.:  [id number]          PATIENT TYPE:  REC   LOCATION:  [facility]                          FACILITY:  [facility]   PHYSICIAN:  [name], M.D.DATE OF BIRTH:  21-Oct-1945   DATE OF PROCEDURE:  12/28/2006  DATE OF DISCHARGE:                               OPERATIVE REPORT   [name] returns today for botulinum toxin injection, left upper  extremity.  He has spasticity inhibiting opening of the hand but not so  much closing of the hand.  He has had a CVA causing spastic left  hemiplegia.  He has had some improvement with phenol injection to the  left posterior tibialis nerve which has resulted in easier ankle  dorsiflexion.   PHYSICAL EXAMINATION:  GENERAL:  No acute distress.  Mood and affect  appropriate.   He has given written consent after I explained the risks and benefits,  including excessive weakness in the left hand.   Areas over the left palmaris longus, left flexor carpi radialis, left  flexor digitorum sublimis, and left pronator teres were marked and  prepped with Betadine, entered with 26-gauge 1-inch needle electrode  under EMG guidance.  The patient had appropriate EMG activity and then  injected 25 units of Botox into each site.  Dilution is 50 units/mL.  The patient tolerated the procedure well.      [name], M.D.  Electronically Signed     AEK/MEDQ  D:  12/28/2006 16:05:27  T:  12/29/2006 11:42:40  Job:  [id number]

## 2010-07-30 NOTE — Procedures (Signed)
[name], [name]               ACCOUNT NO.:  [id number]   MEDICAL RECORD NO.:  [id number]          PATIENT TYPE:  REC   LOCATION:  [facility]                          FACILITY:  [facility]   PHYSICIAN:  [name], M.D.DATE OF BIRTH:  02/28/1946   DATE OF PROCEDURE:  05/25/2008  DATE OF DISCHARGE:                               OPERATIVE REPORT   This is a left forearm flexor injection of botulinum toxin.   INDICATION:  Spastic hemiparesis due to stroke with spasticity  interfering with mobility and interfering with ADLs.  Injection dilution  50 units/cc on botulinum toxin A.   Informed consent was obtained after describing risks and benefits, these  include bleeding, bruising, infection, as well as side effects from  medication itself including respiratory depression.  He elected to  proceed and has given written consent.  The patient placed in a seated  position area with forearm marked and prepped with Betadine and alcohol.  Then, a 26-gauge 50-mm needle electrode inserted under needle EMG  guidance.  Two areas over the left brachial radialis were injected with  25 units each, three areas over the left FDS were injected with 25 units  each.  One area of the pronator teres was injected with 25 units and two  areas of the flexor carpi radialis were injected with 25 units each.   The patient tolerated the procedure well.  Post injection instructions  given.      [name], M.D.  Electronically Signed     AEK/MEDQ  D:  05/25/2008 15:36:54  T:  05/26/2008 02:09:40  Job:  [id number]

## 2010-07-30 NOTE — Procedures (Signed)
[name], [name]               ACCOUNT NO.:  [id number]   MEDICAL RECORD NO.:  [id number]          PATIENT TYPE:  REC   LOCATION:  [facility]                          FACILITY:  [facility]   PHYSICIAN:  [name], M.D.DATE OF BIRTH:  [birth year]   DATE OF PROCEDURE:  09/21/2008  DATE OF DISCHARGE:                               OPERATIVE REPORT   Mr. Ehresman returns today, 65 year old male, central post stroke-pain  syndrome as well as spastic hemiplegia limiting function and only  partially responsive to oral antispastic medications and physical  therapy.  Botox injection last performed on May 25, 2008.   REMS form was signed.  Informed consent was also obtained after  describing risks and benefits of the procedure with the patient  including bleeding, bruising, infection as well as toxin effect.  He  elects to proceed.   The patient in the supine position, 2 areas over the left flexor carpi  radialis, 2 areas of the left brachial radialis, 1 area of the left  pronator teres, 3 areas over the left flexor digitorum suppleness were  marked and prepped with Betadine and alcohol, entered with a 26 gauge 50  mL needle electrode hooked up to an EMG machine.  After appropriate EMG  activity, one-quarter mL of botulinum toxin diluted at 100 units/mL was  injected into each site after negative drawback for blood.  The patient  tolerated the procedure well.  Post injection instructions given.  I  will see him back in 3 months, potential reinjection.      [name], M.D.  Electronically Signed     AEK/MEDQ  D:  09/21/2008 12:44:50  T:  09/22/2008 00:49:27  Job:  [id number]

## 2010-07-30 NOTE — Group Therapy Note (Signed)
CONSULTATION FOR EVALUATION OF LEFT SIDED PAIN   Consult request by Dr. Gala Romney   HISTORY OF PRESENT ILLNESS:  This is a 65 year old male who had a CVA in  1996 affecting, what sounds like, brain stem area, although he does not  have the scans to corroborate this information with.  This was when he  lived in [place] and he received treatment and inpatient as well as  outpatient rehabilitation following his stroke.  He improved to the  point that he is now independent with all his self-care and mobility.  He is back to driving, he can climb steps, he can walk without  assistance.  His was last employed in 2000, but has been on disability  since 1996.  He rates his pain as being 9/10 on average, it is currently  7/10.  Describes as burning and numbing and constant tingling.  He has  been on multiple medications, per his report, for this such as  Neurontin, Lyrica, Nortriptyline and other medicines but he does not  have a list of them.  He was wondering why he could not try just some  regular pain medication for this problem.  His sleep is good.  Pain is  worse with walking standing and some other activities .  He had some  resistance from limb swelling in the left foot, sleep problems as well  as some trouble walking related to his foot and ankle weakness due to  his stroke.  His other physicians include Dr. Sandria Manly and Dr. Rosina Lowenstein.   PAST MEDICAL HISTORY:  In addition to above, he has a history of a  coronary artery bypass surgery, he has had stenting of the circumflex in  2005.   SOCIAL HISTORY:  Married, lives with his wife.  No alcohol abuse or drug  abuse.   MEDICATIONS:  Lipitor 20 mg daily, Namenda 5 mg daily, furosemide 40 mg  daily, metacresol 40 mg daily, allopurinol 100 daily, Metformin 850 mg  daily, Plavix 75 mg daily, metoprolol 25 mg daily, aspirin 81 mg daily,  Zyrtec10 mg daily, NitroQuick p.r.n. and Protonix 40 mg daily.   ALLERGIES:  None.   PHYSICAL  EXAMINATION:  Vital signs:  Blood pressure 129/85, pulse 57.  Weight 277 pounds.  Height 6 feet 3 inches.  Neurologic:  Cranial nerves  II-XII are intact. Right upper and lower extremity strength are normal.  Range of motion is normal in right upper and lower extremities.  Left  upper extremity is 4- at the deltoid, 3- at the biceps and triceps, 3-  at the wrist flexor and extensor.  He does have Ashworth grade 2  spasticity of the biceps and wrist and fingers.  He does have rationing  movements with repeated flexion/extension of his hand, wrist and elbow.  The left lower extremity is 4/5 strength in the knee extensor and 3- at  the ankle dorsiflexor.  He has decreased range of motion at the ankle.  He has non-sustaining clonus of the left ankle.  He has hyperactive knee  reflex on the left.  Gait shows some toe drag but no instability on the  left side.  He does have some increased tone in the left upper extremity  along with ambulation as well.  He has no hyperalgesia to touch on the  left side.   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   PLAN:  1. He does have the associated spasticity with decreased functioning.  I think he can benefit from a musculocutaneous nerve block to      reduce biceps spasticity and increase functional ability with the      left upper extremity.  Also he would benefit from [facility] toxin      in the left forearm 100 units only to reduce the flexor tone by it.      This would be followed by occupational therapy.  2. He would also benefit from left central tibial nerve block to      reduce plantar flexor spasticity and reduce foot drag.  3. Central pain syndrome is really not so severe in terms of allodynia      but does interfere with certain activities such as working around      the house and walking.  Will trial him on Tramadol 50 mg t.i.d.,      may be able to go up from there, given that he has already been on      tricyclics,  gabapentin, Lyrica, would not retrial those.  Other      consider would be Topamax should the Tramadol not be effective.      Will check his drug screen, opiates sparingly in this situation      given fall risk.      [name], M.D.  Electronically Signed     AEK/MedQ  D:  11/13/2006 18:34:24  T:  11/14/2006 16:18:12  Job #:  [id number]   cc:   Bevelyn Buckles. Bensimhon, MD  [address], [place] [address]

## 2010-07-30 NOTE — Procedures (Signed)
NAMELUX, MEADERS               ACCOUNT NO.:  [id number]   MEDICAL RECORD NO.:  [id number]          PATIENT TYPE:  REC   LOCATION:  [facility]                          FACILITY:  [facility]   PHYSICIAN:  [name], M.D.DATE OF BIRTH:  1945-07-06   DATE OF PROCEDURE:  11/14/2008  DATE OF DISCHARGE:                               OPERATIVE REPORT   A 65 year old male status post CVA with spastic hemiplegia.  Pain is  only his spasticity limits dysfunction and is only partially response to  oral medications and physical therapy.  He has done well with prior  phenol injections of the left tibial nerve under needle E-stim guidance.  His last injection was in November 2009.   Informed consent was obtained after describing risks and benefits of the  procedure with the patient.  These include bleeding, bruising, and  infection.  He elects to proceed and has given written consent.  The  patient was placed prone on exam table.  Area marked, E-stim with the  EMG stimulator obtained plantar flexion twitch followed by Betadine prep  and insertion of 50 mm 22-gauge needle electrode with extension IV  tubing, under E-stim guidance plantar flexion twitch was obtained,  confirmed at 2 mA followed by injection of 5% phenol x5 mL.  The patient  tolerated the procedure well.  Post injection instructions given.      [name], M.D.  Electronically Signed     AEK/MEDQ  D:  11/14/2008 09:22:02  T:  11/14/2008 22:03:44  Job:  [id number]

## 2010-07-30 NOTE — Assessment & Plan Note (Signed)
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  More recently he describes in coordination with left upper  extremity, this preceded his Botox injection on May 25, 2008.  He is  dropping objects and having difficulty with writing.  He has had no  other new medical problems in the interval time.  His left lower  extremity foot drag has improved after tibial nerve block for excessive  plantar flexion.   He has been working on getting a house built.  He is retired otherwise.   His pain level is 5-6/10 on the left side.   REVIEW OF SYSTEMS:  Numbness, trouble walking, spasms, and sleep apnea.   PHYSICAL EXAMINATION:  VITAL SIGNS:  Blood pressure 139/87, pulse 63,  and weight 261 pounds.  GENERAL:  No acute stress.  NEUROLOGIC:  Mood and affect appropriate.  MUSCULOSKELETAL:  His upper extremity has no tenderness to palpation.  His neck has no tenderness to palpation.  He has a negative Spurling  sign.  He has negative reverse Phalen's, negative Tinel's.  Sensation is  reduced on the entire left side in a nondermatomal pattern.  His ankle  has no evidence of clonus on the left lower extremity.  Knees extensor  strength is 4/5.  Upper extremity strength is 4/5.  No evidence of  intrinsic atrophy in the upper extremity.  No hypersensitivity to touch.   IMPRESSION:  1. Central post-stroke pain syndrome.  2. Increased weakness and decreased function of the left upper      extremity.  I suspect he may be developing a carpal tunnel      syndrome.  He is left hand dominant.  This is certainly hard to      diagnose clinically in the setting of a stroke.  Therefore, we will      get an EMG NCV to check this objectively.   Discussed with the patient and agrees with plan.  He does not need any  medications at this time.  Currently does not need any repeat injection  for  spasticity.      [name], M.D.  Electronically Signed     AEK/MedQ  D:  07/21/2008 09:45:32  T:  07/21/2008 23:02:19  Job #:  [id number]   cc:   Bevelyn Buckles. Bensimhon, MD  [address] [place] [address]   Barbette Hair. [place], DO  [address] [place], [place] [address]

## 2010-07-30 NOTE — Procedures (Signed)
NAMEALDAHIR, LITAKER               ACCOUNT NO.:  [id number]   MEDICAL RECORD NO.:  [id number]          PATIENT TYPE:  REC   LOCATION:  [facility]                          FACILITY:  [facility]   PHYSICIAN:  [name], M.D.DATE OF BIRTH:  March 08, 1946   DATE OF PROCEDURE:  11/23/2006  DATE OF DISCHARGE:                               OPERATIVE REPORT   PROCEDURE:  Left musculocutaneous nerve neurolysis with phenol under e-  stim guidance, as well as left brachial radialis motor point block.   INDICATIONS:  Spasticity and pain, left upper extremity, particularly in  the biceps and forearm region.   DESCRIPTION OF PROCEDURE:  Informed consent was obtained after  describing risks and benefits of the procedure to the patient.  These  include bleeding, bruising and infection.  He elects to proceed and has  given permission.  The patient was placed supine on exam table.  Axillary area was stimulated using an EMG stimulator, repetitive stim 1  Hz, biceps twitch of pain and area marked and prepped with Betadine and  entered with an 80-mm, 22-gauge needle electrode, e-stim guidance.  His  biceps twitch was once again obtained and confirmed at 0.5 mA and 4 cc  of 5% phenol injected.  Then a left brachial radialis motor point was  found using external DC stim, followed by prepping the area with  Betadine, and entering with the 22-gauge needle electrode.  Appropriate  twitch was obtained and confirmed at 0.9 mA, and then 1 cc of 5% phenol  was injected without eliciting twitch.  The patient tolerate the  procedure well.  Postinjection instructions given.      [name], M.D.  Electronically Signed     AEK/MEDQ  D:  11/23/2006 16:47:23  T:  11/24/2006 11:33:45  Job:  [id number]   cc:   Bevelyn Buckles. Bensimhon, MD  [address] [place] [address]

## 2010-07-30 NOTE — Assessment & Plan Note (Signed)
A 65 year old male who returns today.  He had a botulinum toxin  injection left upper extremity 150 units which was a bit higher than his  prior. He did notice some grip strength loss but this has been improving  over the last week or so.  He is about 1 month post injection.  In  addition, he had a left tibial nerve block with phenyl which was helpful  as well as loosening up his ankle and reducing pain associated with  stiffness in his left lower extremity.  His average pain is about 6/10.  Sleep is fair to good.  His pain increases walking.   PHYSICAL EXAMINATION:  VITALS:  His blood pressure is 154/94, this is  right after walking long distance.  Pulse 84, weight 275 pounds.  GENERAL:  No acute distress.  EXTREMITIES:  His left biceps tone is Ashworth 0, wrist and fingers  Ashworth 1.  Modified Ashworth 1.  Left upper extremity strength is 4/5  in the deltoid triceps grip and left lower extremity is 4/5 in hip  flexor, knee extensor, ankle dorsiflexor is 1.  Gait shows no toe drag  or knee instability.   IMPRESSION:  Left spastic hemiplegia improved status post botulinum  toxin injection in left upper extremity and phenyl injection posterior  tibial nerve left lower extremity.  He came down from Ashworth scales of  2-3 down to 0-1.  He has had some concomitant pain relief.  He has had  some temporary  weakness in the left grip but this is improving and it  should continue to improve over the next couple months.   I will see him back in one month.  Will probably need to reinject Botox  in about 2 months.      [name], M.D.  Electronically Signed     AEK/MedQ  D:  05/14/2007 11:45:07  T:  05/15/2007 07:44:48  Job #:  [id number]   cc:   Dr. Sampson Goon

## 2010-07-30 NOTE — Procedures (Signed)
[name], [name]               ACCOUNT NO.:  [id number]   MEDICAL RECORD NO.:  [id number]          PATIENT TYPE:  REC   LOCATION:  [facility]                          FACILITY:  [facility]   PHYSICIAN:  [name], M.D.DATE OF BIRTH:  [birth year]   DATE OF PROCEDURE:  12/02/2006  DATE OF DISCHARGE:                               OPERATIVE REPORT   PROCEDURE:  Left tibial nerve neurolysis with phenol under E-stim  guidance.   INDICATIONS:  Spasticity and pain left ankle plantar flexors.   Informed consent was obtained after describing the risks and benefits of  the procedure. These include bleeding, bruising, and infection. He  elects to proceed and has given permission.   The patient placed supine on exam table, popliteal fossa was stimulated  using EMG stimulator, repetitive stim, 1 Hz, plantar flexion twitch  obtained, area marked, prepped with Betadine, entered with a 80-mm 22-  gauge needle electrode under E-stim guidance.  A biceps twitch was once  again obtained and confirmed at 0.5 mA and then 4 mL of 5% phenol were  injected with reduction of plantar flexion twitch. The patient tolerated  the procedure well.  He had some numbness on the bottom of foot post  injection but no pain. Post injection instructions given.      [name], M.D.  Electronically Signed     AEK/MEDQ  D:  12/02/2006 09:33:16  T:  12/02/2006 12:27:06  Job:  [id number]   cc:   Bevelyn Buckles. Bensimhon, MD  [address], [place] [address]

## 2010-07-30 NOTE — Procedures (Signed)
NAMEJIAIRE, [name]               ACCOUNT NO.:  [id number]   MEDICAL RECORD NO.:  [id number]          PATIENT TYPE:  REC   LOCATION:  [facility]                          FACILITY:  [facility]   PHYSICIAN:  [name], M.D.DATE OF BIRTH:  [birth year]   DATE OF PROCEDURE:  DATE OF DISCHARGE:                               OPERATIVE REPORT   A 65 year old male with prior history of left spastic hemiplegia due to  CVA.  He has had recurrence of left spastic hemiplegia.  He has had  Botox injection last performed on April 15, 2007.  He is 3 months  post, and it has been wearing off.   His spasticity interferes with ADLs such as writing.  He is left handed.   Informed consent was obtained after describing risks and benefits of the  procedure to the patient.  These include bleeding, bruising, infection.  He elects to proceed and has given written consent.  The patient placed  in a sitting position.  Areas marked and prepped over the left pronator  teres, left flexor carpi radialis, left palmaris longus, and left flexor  digitorum sublimis, and then a 26-gauge 2-inch needle electrode was  inserted under needle EMG guidance.  After appropriate EMG activity  obtained, 25 units of Botox were injected in each site after negative  drawback for blood.  Dilution was 50 units/mL.   The patient tolerated the procedure well.  Post injection instructions  given.  Follow up in 1 month.      [name], M.D.  Electronically Signed     AEK/MEDQ  D:  07/12/2007 10:43:17  T:  07/12/2007 11:10:21  Job:  [id number]

## 2010-07-30 NOTE — Assessment & Plan Note (Signed)
[name] returns today increasing pain and tone in the left upper and  left lower extremity.  He has had no falls, no injuries.  His average  pain is 6/10 and interferes moderately with enjoyment of life and  general activity.  Describes this pain as burning and constant.  Can  walk 20 minutes at a time.  He climbs steps.  He drives.  His pain is  relieved by Ultram but not by Zanaflex.  He has trouble walking related  to his CVA.   He was last seen by me 01/26/07.  His last Botox injection was done in  12/28/06.  He also had a left phenyl injection in the tibial nerve in  11/2006.   PHYSICAL EXAMINATION:  GENERAL:  No acute distress.  Orientation x3.  Affect is bright.  Somewhat frustrated.  EXTREMITIES:  His gait shows no evidence of toe drag or knee instability  but has a mild steppage type of pattern.  He has clonus at the left  ankle.  He has problems with opening up his left hand from a fisted  position.  His finger flexor spasticity is 2 and his wrist is 2-3.  His  biceps is 2.  His ankle is 3.   His right sided tone and strength are normal.  Motor strength difficult  to evaluate secondary to the influence of tone but is antigravity plus.   IMPRESSION:  Left spastic hemiplegia secondary to right CVA.   PLAN:  Repeat Botox next month with 150 units in the left upper  extremity.  Will do phenol in left lower extremity but also supplement  with additional 15 to the posterior tibialis and left lower extremity.   Will need some heel cord stretching afterwards with PT.      [name], M.D.  Electronically Signed     AEK/MedQ  D:  03/16/2007 16:02:39  T:  03/16/2007 19:25:19  Job #:  [id number]

## 2010-07-30 NOTE — Procedures (Signed)
[name], [name]               ACCOUNT NO.:  [id number]   MEDICAL RECORD NO.:  [id number]          PATIENT TYPE:  REC   LOCATION:  [facility]                          FACILITY:  [facility]   PHYSICIAN:  [name], M.D.DATE OF BIRTH:  05/06/1945   DATE OF PROCEDURE:  DATE OF DISCHARGE:                               OPERATIVE REPORT   PROCEDURE:  Left forearm and left leg botulinum toxin injection under  needle EMG guidance.   INDICATIONS:  Left spastic hemiplegia due to right CVA, dilution of  Botox of 50 units/mL.   His spasticity is limiting function and his pain is partially relieved  by Ultram but not relieved by antispasticity medications such as  Zanaflex.  He has had good results Botox injection December 28, 2006 and  last phenol injection 11/2006.   Informed consent was obtained after describing risks and benefits of  procedure to the patient.  These include bleeding, bruising, infection,  nerve damage.  He elects to proceed and has given written consent.  The  patient placed prone on fluoroscopy table, Betadine prep, sterile drape  50-mm 26-gauge needle electrode was utilized for the injection.  One  area of the medial leg just posterior to the tibia was marked prepped  with Betadine, entered with the needle electrode under appropriate EMG  guidance.  1 mL of the Botox solution containing 50 units per  mL were  injected.  Then the left forearm was marked prepped Betadine and two  areas over the left flexor carpi radialis each received 25 units of  Botox, two areas of the left flexor digitorum sublimis each received 25  units of Botox and one area over the flexor pollicis longus received 50  units of Botox.  All injections done after appropriate EMG activity and  negative drawback for blood.  The patient tolerated procedure well.  Postprocedure instructions given.      [name], M.D.  Electronically Signed     AEK/MEDQ  D:  04/15/2007 17:27:21  T:   04/16/2007 09:03:09  Job:  [id number]

## 2010-07-30 NOTE — Procedures (Signed)
NAMEREGINALD, WEIDA               ACCOUNT NO.:  [id number]   MEDICAL RECORD NO.:  [id number]          PATIENT TYPE:  REC   LOCATION:  [facility]                          FACILITY:  [facility]   PHYSICIAN:  [name], M.D.DATE OF BIRTH:  March 19, 1945   DATE OF PROCEDURE:  05/25/2008  DATE OF DISCHARGE:                               OPERATIVE REPORT   This is a left tibial nerve neurolysis with phenol.  Indication is  spastic hemiparesis after stroke with spasticity interfering with  mobility.   He had good results with phenol injection in the past, last one  performed about 5 months ago.   Informed consent obtained after describing risks and benefits of the  procedure with the patient.  These include bleeding, bruising,  infection.  He elects to proceed and has given a written consent.  The  patient was placed in a prone position.  External DC stimulator obtained  plantarflexion twitch, then area marked and prepped with Betadine,  entered with 22-gauge 40-mm needle electrode hooked onto a peripheral  nerve stimulator and plantar flexion twitch was once again obtained and  confirmed at 1.5 mA and a solution containing 5% phenol x4 mL injected  after negative drawback for blood.  The patient tolerated the procedure  well.  Post-injection instructions given.      [name], M.D.  Electronically Signed     AEK/MEDQ  D:  05/25/2008 15:34:43  T:  05/26/2008 60:45:40  Job:  [id number]

## 2010-07-30 NOTE — Assessment & Plan Note (Signed)
[facility]                            CARDIOLOGY OFFICE NOTE   NAME:[name]                      MRN:          [id number]  DATE:03/06/2008                            DOB:          11-Apr-1945    PRIMARY CARE PHYSICIAN:  [name]   INTERVAL HISTORY:  [name] is a very pleasant 65 year old male with a  history of coronary artery disease status post bypass surgery in 2004  and Taxus drug-eluting stent to the circumflex in 2005.  He had a heart  catheterization in February 2006 showing a patent LIMA to the LAD,  patent saphenous vein graft to the diagonal, and patent saphenous vein  graft to the OM.  There was a 50-60% lesion in the proximal portion of  the saphenous vein graft to the PDA.  Remainder of his medical history  is notable for chronic chest pain, hypertension, hyperlipidemia,  previous stroke with left-sided weakness and numbness, diabetes,  obstructive sleep apnea on CPAP, Myoview in December 2007 showed normal  LV function with no ischemia.   He returns today for routine followup.  Overall, he is doing fairly  well.  He has been seeing Dr. Claudette [name] for his chronic pain  syndrome on his left side due to his stroke.  He has been receiving  Botox injections.  These have helped markedly.  He denies any chest  pain.  No dyspnea.  He does ride his exercise bike about once a week,  but does not seem like he does it very strenuously.  He had a sore on  the tip of his left second toe which is now healed.  He had ABIs which  showed normal blood flow throughout.   CURRENT MEDICATIONS:  1. CPAP.  2. Ambien 5 mg nightly.  3. Aspirin 81.  4. Allopurinol.  5. Plavix 75 a day.  6. Toprol 25 a day.  7. Lipitor 20 a day.  8. Protonix 40 a day.  9. Zyrtec 10 a day.  10.Lasix 40 mg every other day.  11.Metformin 850 b.i.d.  12.Tramadol.  13.Ultram.   PHYSICAL EXAMINATION:  GENERAL:  He is no acute distress.  He ambulates  around the clinic with a limp.  VITAL SIGNS:  Respirations are unlabored.  Blood pressure is 115/80,  heart rate 61, weight is 266.  HEENT:  Normal.  NECK:  Supple.  No JVD.  Carotids are 2+ bilaterally without bruits.  There is no lymphadenopathy or thyromegaly.  CARDIAC:  PMI is nonpalpable.  He is a regular rate and rhythm with an  S4.  No murmurs.  LUNGS:  Clear.  ABDOMEN:  Obese, nontender, nondistended.  Unable to appreciate any  hepatosplenomegaly.  No bruits.  No mass.  EXTREMITIES:  Warm with no cyanosis, clubbing, or edema.  He is weak on  his left side.  He is a support stocking on the left leg.  There is a  small healing ulcer on the tip of his left second toe.  PT pulses are 2+  on the right and 1+ on the left.  NEUROLOGIC:  Alert and oriented x3.  Cranial nerves II through XII are  intact.  Once again weakness on his left side.  Affect is pleasant.   ASSESSMENT AND PLAN:  1. Coronary artery disease. This is stable.  Continue current therapy.  2. Hypertension, well controlled.  3. Diabetes.  This is followed by Dr. Artist Pais.  Given to have a low      threshold to add an ACE inhibitor.  4. Hyperlipidemia.  Once again, followed by Dr. Artist Pais.  Goal LDL is less      than 70.   DISPOSITION:  Overall, he is doing well.  I have encouraged him to get  more exercise.  He will follow up here in 9 months for routine followup.     [name]  Electronically Signed    DRB/MedQ  DD: 03/06/2008  DT: 03/06/2008  Job #: [id number]   cc:   [name]

## 2010-07-30 NOTE — Op Note (Signed)
NAMECORDERO, SURETTE               ACCOUNT NO.:  [id number]   MEDICAL RECORD NO.:  [id number]          PATIENT TYPE:  AMB   LOCATION:  DSC                          FACILITY:  [facility]   PHYSICIAN:  Artist Pais. Weingold, M.D.DATE OF BIRTH:  Sep 11, 1945   DATE OF PROCEDURE:  07/29/2006  DATE OF DISCHARGE:                               OPERATIVE REPORT   PREOPERATIVE DIAGNOSIS:  Displaced left 5th digit middle phalangeal  dorsal base fracture.   POSTOPERATIVE DIAGNOSIS:  Displaced left 5th digit middle phalangeal  dorsal base fracture.   PROCEDURE:  Closed reduction, percutaneous pinning of above.   SURGEON:  Dr. Mina Marble   ASSISTANT:  None.   ANESTHESIA:  General.   TOURNIQUET TIME:  21 minutes.   COMPLICATIONS:  None.   DRAINS:  None.   The patient taken to the operating suite.  After the induction of  __________ anesthesia, the left upper extremity is prepped in usual  sterile fashion.  An Esmarch was used to exsanguinate the limb.  Tourniquet was then inflated to 250 mmHg.  At this point in time,  longitudinal traction was applied to the 5th digit on the left hand to a  towel clip to the distal aspect of the middle phalanx.  Intraoperative  fluoroscopy revealed reduction of a dorsally displaced fracture at the  base of the middle phalanx dorsally.  This was fixed with two 0.035 K-  wires.  They were driven from dorsal to volar parallel to the joint  surface.  Intraoperative fluoroscopy revealed good reduction in both the  AP, lateral, and oblique view.  The K-wires were cut outside the skin.  Caps were placed upon the K-wires.  They were dressed with Xeroform, 4 x  4's, and __________ splint.  The patient tolerated the procedure well.      Artist Pais Mina Marble, M.D.  Electronically Signed     MAW/MEDQ  D:  07/29/2006  T:  07/29/2006  Job:  [id number]

## 2010-07-30 NOTE — Procedures (Signed)
NAMEMEADE, HOGELAND               ACCOUNT NO.:  [id number]   MEDICAL RECORD NO.:  [id number]          PATIENT TYPE:  REC   LOCATION:  TPC                          FACILITY:  [facility]   PHYSICIAN:  [name], M.D.DATE OF BIRTH:  1946/02/23   DATE OF PROCEDURE:  DATE OF DISCHARGE:                               OPERATIVE REPORT   PROCEDURE:  Left forearm flexor botulinum toxin injection under EMG  guidance.   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  His hand coordination  and functional usage is reduced secondary to spasticity and has been  improved in the past with botulinum toxin injection.   He has already tried physical therapy as well as Zanaflex.   Informed consent was obtained after describing risks and benefits of the  procedure with the patient.  These include bleeding, bruising, and  infection.  He elects to proceed and has given written consent.   The patient placed semi-recline position on exam table.  Two areas over  the flexor carpi radialis, one area of the pronator teres, and three  areas over the flexor digitorum sublimis were marked and prepped with  Betadine.  Botulinum toxin type A dilution was 50 units/mL.  50 units  were injected into the pronator teres, 25 units were injected into each  of two sites on the flexor carpi radialis, 25 units were injected into  the left palmaris longus, and 75 units were injected into the left  flexor digitorum sublimis.  The patient tolerated the procedure well.  Post injection instructions given.  All injections were done under  needle EMG guidance after obtaining appropriate EMG activity using a 50  mm 26 gauge needle electrode.  He will follow up with me in 2 months.      [name], M.D.  Electronically Signed     AEK/MEDQ  D:  11/23/2007 12:59:44  T:  11/24/2007 02:40:13  Job:  [id number]

## 2010-07-30 NOTE — Procedures (Signed)
[name], [name]               ACCOUNT NO.:  [id number]   MEDICAL RECORD NO.:  [id number]          PATIENT TYPE:  REC   LOCATION:  [facility]                          FACILITY:  [facility]   PHYSICIAN:  [name], M.D.DATE OF BIRTH:  [birth year]   DATE OF PROCEDURE:  01/17/2008  DATE OF DISCHARGE:                               OPERATIVE REPORT   PROCEDURE:  This is a left musculocutaneous neurolysis with phenol under  electrical stimulation guidance.   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  Increased biceps tone  inhibits movement and functional usage.  He has tried physical therapy  and Zanaflex without adequate relief.   Informed consent was obtained after describing the risks and benefits of  procedure with the patient.  These include bleeding, bruising,  infection.  He elects to proceed and has given written consent.  The  patient placed in a semireclined position on an exam table.  Area over  the left axilla marked and prepped with Betadine.  E-stim confirmed  biceps twitch.  Once again, marked and prepped with Betadine.  Then  needle stim, 50-mm needle x 26-gauge used to stimulate needle confirmed  at 0.5 mA.  Then, a solution containing 5 mL of 5% phenol injected after  negative drawback for blood.  The patient tolerated the procedure well.  Postinjection has a smoother and lesser ratcheting extension of the arm.  I will see him back to 6 weeks for Botox injection left upper extremity.      [name], M.D.  Electronically Signed     AEK/MEDQ  D:  01/17/2008 10:04:13  T:  01/17/2008 23:56:29  Job:  [id number]

## 2010-07-30 NOTE — Assessment & Plan Note (Signed)
[name] returns today.  He called me 2 days ago informing me about  some bruising and swelling in his left upper extremity.  He underwent a  musculocutaneous nerve block with E-stim guidance and phenol neurolysis  on January 17, 2008.  He has had no pain in the upper extremities.  No  progressive swelling.  He has had no increased weakness in the arm.  No  fevers or chills.   PHYSICAL EXAMINATION:  Examination of left arm reveals motor strength at  baseline, which is basically 3+ to 4-/5.  His biceps tone has improved.  His sensation is diminished, but baseline.  He does have a 3-cm hematoma  in the left axillary region with discoloration on the medial biceps,  which is mildly ecchymotic and in the medial elbow as well.  His pulses  are good, both brachial and radial.  He has good warmth in his fingers.  He has no fusiform swelling of his arm.  Basically, some swelling around  the ecchymotic areas.  No erythema.  No tenderness.   IMPRESSION:  Post-injection hematoma and ecchymosis.  I believe that the  elbow swelling and ecchymosis is more related to tracking from the  hematoma in the left axillary region.  I have recommended elevation and  heat to help clear this up.  If he has any fevers, if he has any  increased swelling, and if he has any other increased pain symptoms, I  have told him to contact me, and we would order an ultrasound of the  upper extremities.      [name], M.D.  Electronically Signed     AEK/MedQ  D:  01/21/2008 10:54:41  T:  01/22/2008 00:09:58  Job #:  [id number]

## 2010-07-30 NOTE — Procedures (Signed)
[name], [name]               ACCOUNT NO.:  [id number]   MEDICAL RECORD NO.:  [id number]           PATIENT TYPE:   LOCATION:                                 FACILITY:   PHYSICIAN:  [name], M.D.DATE OF BIRTH:  [birth year]   DATE OF PROCEDURE:  DATE OF DISCHARGE:                               OPERATIVE REPORT   PROCEDURE:  Left musculocutaneous nerve neurolysis under  electrostimulation guidance.   INDICATIONS:  Left spastic hemiplegia with biceps spasms unrelieved by  medications.  He has had good relief in the past with musculocutaneous  nerve block.   Informed consent obtained after describing risks and benefits of the  procedure to the patient.  This last injection was done on November 23, 2006, and has lost effectiveness.  The patient elects to proceed and has  given written consent.   The patient was placed on exam table.  The external DC stim using an EMG  stimulator performed, biceps twitch obtained.  The area marked and  prepped with Betadine, entered with a 22-gauge 50-mm needle electrode  under E-stim guidance.  Biceps twitch obtained and confirmed at 0.5 mA  followed by injection of 4 mL of 5% phenol for the neurolytic agent.  The patient tolerated procedure well.  Post injection instructions  given.  He has been given prescriptions for Ultram ER 200 mg a day and  Ultram 50 mg p.o. daily 3 months 5 each.  The patient tolerated the  procedure well.  He will see me back in about 2-1/2 month for followup  to see whether he will need Botox reinjection at that time.      [name], M.D.  Electronically Signed     AEK/MEDQ  D:  08/16/2007 10:58:03  T:  08/17/2007 00:56:59  Job:  [id number]

## 2010-07-30 NOTE — Assessment & Plan Note (Signed)
[facility]                            CARDIOLOGY OFFICE NOTE   NAME:[name], [name]                      MRN:          [id number]  DATE:10/12/2006                            DOB:          08-02-1945    PRIMARY CARE PHYSICIAN:  Thomos Lemons, M.D.   HISTORY OF PRESENT ILLNESS:  [name] is a very pleasant 65 year old  male with a history of coronary artery disease with bypass surgery in  2004 and Taxus drug eluting stent to the circumflex in 2005. He does  have chronic chest pain. He had a heart catheterization in February of  2006 showing a patent LIMA to the LAD, a patent saphenous vein graft to  the diagonal and a patent saphenous vein graft to the OM. There is a 50%  to 60% lesion in the proximal portion of the saphenous vein graft to the  posterior descending. He also has a history of hypertension and  hyperlipidemia and a previous stroke with left sided weakness and  numbness. He was admitted to the hospital in December 2007 with chest  pain, ruled out for myocardial infarction, and had a Myoview showing  normal LV function with no ischemia. He previously saw Dr. Corinda [name] and  presents today to establish long term care. His main complaint is  persistent pain and numbness on his left side, related to his stroke. He  has seen Dr. Sandria [name] and tried on multiple medications including Celebrex,  Neurontin, Lyrica, and Nortriptyline. While some of these have provided  relief, they have made him sleepier and had other side effects. From a  cardiac point of view, he does continue to have some chest pain about 2  to 3 times a week. It is a mild pressure, resolved after 1 to 2 hours.  There are no associated symptoms. His suspicion is that this is non-  cardiac. There has been no change in the pattern. There has been no  relation to exertion.   CURRENT MEDICATIONS:  CPAP, Ambien 5 mg q.h.s., aspirin 81, Allopurinol  400 mg daily, Plavix 75 daily, Toprol XL 25  daily, Lipitor 20 daily,  Protonix 40 daily, Zyrtec 10 daily, Lasix 40 every other day, and  Metformin 850 mg b.i.d. with meals.   PHYSICAL EXAMINATION:  GENERAL:  He ambulates around the clinic with a  limp. He is in no acute distress.  LUNGS:  Respirations are unlabored. PMI is non-displaced. Lungs are  clear.  VITAL SIGNS:  Blood pressure 128/82, heart rate 57, weight 273.  HEENT:  Normal.  NECK:  Supple. No JVD. Carotids are 2+ bilaterally without any bruits.  There is no lymphadenopathy or thyromegaly.  CARDIAC:  Regular rate and rhythm with an S4. No murmurs.  ABDOMEN:  Obese, nontender, nondistended. There is no  hepatosplenomegaly. No bruits, no masses appreciated.  EXTREMITIES:  Warm with no clubbing, cyanosis, or edema. He does have a  support [name] on his left leg. No rash.  NEUROLOGIC:  Alert and oriented times three. Cranial nerves 2-12 are  intact. He has good strength in his left arm but is weak  in his left  leg. Affect is pleasant.   LABORATORY DATA:  EKG shows normal sinus rhythm with LVH and minimal T-  wave flattening.   ASSESSMENT/PLAN:  1. Coronary artery disease, status post coronary artery bypass      grafting. He does have chronic chest pain but this does not appear      ischemic. Will continue current therapy. Should he have increase in      his symptoms, then one could consider repeat catheterization to re-      evaluate his saphenous vein graft to the patent ductus arteriosus.  2. Hypertension, well controlled.  3. Hyperlipidemia, well controlled. The most recent LDL was about 70.      Will recheck this in 6 months.  4. Neuropathic pain. I have referred him back to see Dr. Sandria [name] and also      suggested that he talk to Dr. Sandria [name] about possibly being referred to      a pain clinic.     Bevelyn Buckles. Bensimhon, MD  Electronically Signed    DRB/MedQ  DD: 10/12/2006  DT: 10/12/2006  Job #: [id number]   cc:   Genene Churn. Love, M.D.  Barbette Hair. Artist Pais, DO

## 2010-07-30 NOTE — Assessment & Plan Note (Signed)
On August 17, 2007, with last phenol neurolysis left musculocutaneous  nerve.  July 12, 2007, he had Botox 200 units in the forearm and phenol  to the left tibial nerve.   MEDICATIONS:  1. Tramadol 1 p.o. t.i.d.  2. Zanaflex 2 mg every evening.   He has had no new medical problems.  He has started on a house, he is  doing the general contracting for.  His average pain is 6/10, interferes  with activity at a 5/10, described as burning, tingling along the entire  left side.  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  His pain does limit him in terms of lifting  objects and standing balance or standing tolerance.  His Oswestry  disability index is 16% in the mild range.   VITAL SIGNS:  Blood pressure is 155/87, pulse 67, and weight 269 pounds.  GENERAL:  A well developed, overweight male in no acute distress.  EXTREMITIES:  Pedal edema 1+, left greater than right side.  Orientation  x3.  Affect is alert.  Gait shows a compensatory hip hiking on the left  side due to poor ankle dorsiflexion on the left.  Coordination is  reduced in the left upper extremity and left lower extremity.  Deep  tendon reflexes are hyperreflexive on the left side.  Sensation reduced  on the left side.  His tone is Ashworth grade 3 at the biceps, 3 at the  wrist and finger flexors, and 3 at the ankle plantar flexors.   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 We will a repeat Botox injection, it is more than 4 months post.      This will be to the left forearm flexors.  2. Repeat phenol neurolysis to left tibial nerve.  In about another      month or 2, we will repeat the musculocutaneous nerve block to      reduce biceps spasticity in left upper extremity.   We will refill his tramadol right 90-day supply of the t.i.d. dosing and  refill Zanaflex 3 months supply 2 mg nightly.      [name], M.D.  Electronically Signed     AEK/MedQ  D:  11/19/2007 08:55:05  T:  11/19/2007 22:59:56  Job #:  [id number]   cc:   Bevelyn Buckles. Bensimhon, MD  [address] [place] [address]   Barbette Hair. [place], DO  [address] [place], [place] [address]

## 2010-07-30 NOTE — Assessment & Plan Note (Signed)
66 year old male with prior history of left spastic hemiplegia  due to his CVA.  Has had botulinum toxin injections x2 left upper  extremity.  He feels that the first injection done in October was  somewhat better in terms of his pain than the second injection, and the  second injection did cause some grip strength loss.  Comparing the two  injections, he did have Botox injection into flexor pollicis longus at  the second injection and the pronator teres was not injected.  In  addition, total dosage was 150 units with the second injection, 100  units with the first injection.   His lower extremity is still doing well.  He is not dragging his toe any  more status post left tibial nerve injection.  This was with Phenol.   His average pain is about 5/10 on the left side.  He does respond well  to his Tramadol.  He takes the extended release Ultram 200 mg per day  and the immediate release 50 mg per day.   He can walk 30 minutes at a time.   REVIEW OF SYSTEMS:  Significant for numbness, tingling in left upper and  lower extremities as well as his weakness in that side.   EXAMINATION:  Blood pressure 157/89.  Pulse 65, weight 272 pounds.  GENERAL:  No acute distress.  EXTREMITY:  Left bicep Ashworth 0.  Finger and wrist flexors are  Ashworth 2 on the modified scale.  Left upper extremity strength is 4/5  in the deltoid, bicep, tricep and grip, 4/5 in the left hip flexor, knee  extensor, but the ankle dorsiflexor is graded as 1.  Right side is 5/5.  GAIT:  Shows a flat-footed gait.  No good heel strike, but on the other  hand, no evidence of toe drag on the left side.   IMPRESSION:  1. Left spastic hemiplegia status post Botulinum toxin left upper      extremity.  He actually did better with the October injection and      when he gets his next injection next month, we will go back to the      dosages and muscle selection as on December 28, 2006.  2. In terms of his left lower  extremity, he has had continued effect      with the Phenol neurolysis of the tibial nerve and he may get a six      month effect out of this so that I do not anticipate reinjecting      next month.   I will see him back in the [name] office for the Botox injection  next month.   I have written a prescription for 90-day supply of his Ultram ER as well  Ultram immediate release.      [name], M.D.  Electronically Signed     AEK/MedQ  D:  06/11/2007 12:13:09  T:  06/11/2007 14:06:12  Job #:  [id number]   cc:   Sampson Goon, Dr.  [facility]  [address]  [address]  [place], [place]  [place]:  [phone]

## 2010-07-30 NOTE — Procedures (Signed)
[name], PRUSINSKI               ACCOUNT NO.:  [id number]   MEDICAL RECORD NO.:  [id number]          PATIENT TYPE:  REC   LOCATION:  TPC                          FACILITY:  [facility]   PHYSICIAN:  [name], M.D.DATE OF BIRTH:  21-Jun-1945   DATE OF PROCEDURE:  04/15/2007  DATE OF DISCHARGE:                               OPERATIVE REPORT   PROCEDURE:  Left tibial nerve block under E Stim guidance for Phenol  neurolysis.   INDICATIONS FOR PROCEDURE:  Left tibial distribution spasticity  interfering with gait and causing pain in the calf.  The pain has been  relieved with prior Phenol injections of the tibial nerve.   DESCRIPTION OF PROCEDURE:  The patient placed prone on exam table.  The  area marked, prepped with Betadine in the left popliteal space.  External DC Stim using EMG stimulator isolated plantar flexor twitch,  then a 22 gauge 50 mm needle electrode was inserted under needle Stim  guidance.  Plantar flexion twitch obtained and confirmed at 0.7  milliamps followed by injection of 4 mL of 5% Phenol with abolition of  plantar flexion twitch.  The patient tolerated the procedure well.  Post  procedure instructions given.      [name], M.D.  Electronically Signed     AEK/MEDQ  D:  04/15/2007 17:28:54  T:  04/16/2007 09:28:59  Job:  [id number]   cc:   Bevelyn Buckles. Bensimhon, MD  [address], [place] [address]

## 2010-07-30 NOTE — Assessment & Plan Note (Signed)
HEALTHCARE                            CARDIOLOGY OFFICE NOTE   NAME:[name], [name] WILKERSON                      MRN:          [id number]  DATE:04/21/2007                            DOB:          1945/07/16    HISTORY:  [name] [name] is pleasant 65 year old male with history of  coronary artery disease status post bypass surgery in 2004 and a TAXUS  drug-eluting stent the circumflex in 2005.  He had a heart  catheterization February 2006 showing a patent LIMA to the LAD, patent  saphenous vein graft to diagonal, patent saphenous vein graft to the OM  is a 50-60% lesion in the proximal portion of the saphenous vein graft  to the posterior descending.  Remainder of his history is notable for  chronic chest pain, hypertension, hyperlipidemia, previous stroke with  left-sided weakness and numbness.  Myoview in December 2007 showed  normal LV function with no ischemia.  Diabetes.   He returns today for routine follow-up.  He says he doing very well  denies any chest pain or dyspnea.  He has been using his CPAP for sleep  apnea.  We last visit referred him to Dr. Wynn Banker in the pain clinic  and he has done very well with his chronic pain and feels much better.   CURRENT MEDICATIONS:  CPAP Ambien five at night, aspirin 81,  allopurinol, Plavix 75 a day, Toprol XL 25 a day, Lipitor 20 at night,  Protonix 40 a day, Zyrtec 10 a day, Lasix 40 every other day, metformin  850 mg b.i.d. with meals, tramadol and Ultram.   PHYSICAL EXAM:  He is a large man, ambulates in the clinic with a limp  he is in no acute distress.  Respirations are unlabored.  HEENT is normal.  Blood pressure 122/76 her in 69 weeks to 84.  NECK:  Supple.  No JVD.  Carotids are 2+ bilateral bruits.  There is no  lymphadenopathy or thyromegaly.  CARDIAC:  PMI is nonpalpable.  Regular rate and rhythm.  S4 no murmurs.  LUNGS:  Clear.  ABDOMEN:  Obese, nontender, nondistended.  I am unable to  appreciate any  hepatosplenomegaly, no bruits, no masses.  EXTREMITIES:  Warm.  No clubbing, cyanosis or edema.  He had a support  stocking on his left leg no rash.  NEURO:  He is alert and x3.  Cranial nerves II-XII are intact.  He has  good strength his left arm his left leg is weak.  Affect is pleasant.   EKG shows normal sinus rhythm with LVH and some mild T-wave flattening.   ASSESSMENT/PLAN:  1. Coronary artery disease is stable.  Continue current therapy.  2. Hypertension, well-controlled.  3. Hyperlipidemia.  Goal LDL is less than 70.  He is due for recheck      this and his with a liver panel.   DISPOSITION:  Will see him back in 9 months routine follow-up.     Bevelyn Buckles. Bensimhon, MD  Electronically Signed    DRB/MedQ  DD: 04/21/2007  DT: 04/22/2007  Job #: [id number]   cc:  [name], M.D.

## 2010-07-30 NOTE — Procedures (Signed)
NAME:  [name], [name]               ACCOUNT NO.:  [id number]   MEDICAL RECORD NO.:  [id number]          PATIENT TYPE:   LOCATION:                                 FACILITY:   PHYSICIAN:  [name], M.D.   DATE OF BIRTH:   DATE OF PROCEDURE:  DATE OF DISCHARGE:                               OPERATIVE REPORT   PROCEDURE:  Left tibial nerve block with phenol.  PATIENT:  [name]   INDICATIONS:  Left tibial distribution plantar flexor spasticity with  left spastic hemiplegia due to CVA.  He has had good results with phenol  neurolysis.  His last phenol neurolysis was performed April 15, 2007.   Informed consent was obtained after describing risks and benefits of the  procedure to the patient.  These include bleeding, bruising, infection,  he elects to proceed and has given written consent.  The patient has had  a plantar flexor spasticity only partially responsive to stretching out  with physical modalities.  He has had trials of oral antispasticity  medications without much success.   The patient was placed in the prone position.  Area marked and prepped  with Betadine, entered with 22-gauge 40 mm needle electrode under E-stim  guidance, plantar flexion twitch obtained and confirmed at 0.5 mA,  followed by injection of 4 mL of 5% phenol solution.  The patient  tolerated the procedure well.  Pre- and post injection vitals stable.  Post injection instructions given.      [name], M.D.  Electronically Signed     AEK/MEDQ  D:  07/12/2007 10:45:42  T:  07/12/2007 11:27:48  Job:  [id number]

## 2010-07-30 NOTE — Procedures (Signed)
[name], [name]               ACCOUNT NO.:  [id number]   MEDICAL RECORD NO.:  [id number]           PATIENT TYPE:   LOCATION:                                 FACILITY:   PHYSICIAN:  [name], M.D.DATE OF BIRTH:  08-19-1945   DATE OF PROCEDURE:  02/28/2008  DATE OF DISCHARGE:                               OPERATIVE REPORT   This is a botulinum toxin injection to the left upper extremity with the  needle EMG guidance.   INDICATIONS:  Left spastic hemiplegia.   HISTORY:  A 65 year old male with CVA causing left spastic hemiplegia.  He has left upper extremity pain and spasticity as result and has  benefited from botulinum toxin injection in the past.  His pain and  spasticity interfere with self-care mobility.   Informed consent was obtained after describing risks and benefits of the  procedure with the patient.  These include bleeding, bruising,  infection, paralysis, or respiratory depression.  He elects to proceed.   The patient was initially in a seated position.  Forearm area is marked  and prepped with Betadine.  The following muscles were in the dilution  of the Botox with 50 units/mL with sterile preservative-free normal  saline, 50 units injected to widen the site at the left brachial  radialis, 25 units injected in the left pronator teres at 1 site, 25  units were injected into each of 2 sites in the left flexor carpi  radialis, 25 units were injected into each of 3 sites in the left flexor  digitorum sublimis, and 50 units were injected each of 2 sites in the  left pectoralis.   All injections done after negative drawback for blood.   Needle EMG was utilized by using a 50-mm, 26-gauge needle electrode.  Appropriate EMG activity obtained.  The patient tolerated the procedure  well.  Pre- and post-injection vitals were stable.  Post-injection  instructions were given.      [name], M.D.  Electronically Signed     AEK/MEDQ  D:   02/28/2008 13:03:40  T:  02/29/2008 03:21:50  Job:  [id number]   cc:   Bevelyn Buckles. Bensimhon, MD  [address] [place] [address]

## 2010-07-30 NOTE — Assessment & Plan Note (Signed)
HISTORY OF PRESENT ILLNESS:  [name] returns for spasticity  inhibiting opening of the hand due to CVA causing a spastic left  hemiplegia.  [name] has had improvements after Botox injection, left flex  carpi radialis, left flexor digitorum sublimis, left palmaris longus and  left pronator teres.  [name] had previous musculocutaneous neurolysis with  phenol for biceps spasticity which was helpful.  [name] has had no tibial  nerve block with phenol thus far.  No, [name] has had tibial nerve block  with phenol but this was not particularly helpful.   Overall [name] is functioning at independent level.  [name] complains of some  pain in the left knee particularly.  Knee x-rays that were ordered by  myself last visit really showed no osteoarthritis and a good joint space  on January 21, 2007.   No new medical history in interval time.   PHYSICAL EXAMINATION:  VITAL SIGNS:  Blood pressure 148/79, pulse 75,  respirations 18, oxygen saturation 97% on room air.  IN GENERAL:  In no acute distress.  Orientation x3.  Affect is bright.  Gait is normal.  EXAMINATION:  [name] has Ashworth grade 1 spasticity in the finger flexors,  2 at the biceps, and 2 to 3 at the ankle plantar flexors.  His gait [name]  goes up on his right toe to clear his left foot.  [name] has no evidence of  knee buckling or hyperextension on the left side.  His adductor tone is  normal on the left side.  Right sided tone and strength are normal.  Motor strength is difficult to fully evaluate secondary to __________  splints but overall is at an anti-gravity minus in the upper and lower  extremity with the exception of essentially zero at the ankle  dorsiflexors.   IMPRESSION:  Left spastic hemiplegia secondary to right cerebrovascular  accident.   PLAN:  Will repeat Botox in about 2 months and in fact will do the upper  extremity with 150 units and the left gastrosoleus posterior tibialis  150 units.   May need some ankle heel cord stretching after that  more intensively  with PT.      [name], M.D.  Electronically Signed     AEK/MedQ  D:  01/26/2007 15:08:15  T:  01/27/2007 09:47:46  Job #:  [id number]   cc:   Sampson Goon  [phone]

## 2010-07-30 NOTE — Procedures (Signed)
[name]               ACCOUNT NO.:  [id number]   MEDICAL RECORD NO.:  [id number]          PATIENT TYPE:  REC   LOCATION:  [facility]                          FACILITY:  [facility]   PHYSICIAN:  [name], M.D.DATE OF BIRTH:  23-Feb-1946   DATE OF PROCEDURE:  DATE OF DISCHARGE:                               OPERATIVE REPORT   PROCEDURE:  Left tibial phenol neurolysis under E-stim guidance.   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  He has ankle plantar flexor spasticity, only partial response  to the medication management.  No other conservative care.  It  contributed to the lower extremity pain as well.  He trialed oral  medications with only partial relief and has had physical therapy in the  past as well.   Informed consent was obtained after describing risks and benefits of the  procedure with the patient.  These include bleeding, bruising, and  infection.  He elects to proceed and has given written consent.  The  patient was placed prone on exam stable.  E-stim using EMG stimulator  applied to the popliteal fossa.  Plantar flexion twitch obtained.  Area  marked, prepped with Betadine, and entered with 22-gauge 40-mm needle  electrode under E-stim guidance.  Plantar flexion twitch was obtained  and confirmed at 0.8 mA followed by injection of 4 mL of 5% phenol after  negative drawback of the blood.  The patient tolerated the procedure  well.  Post-injection instructions given.  I will see him back in about  2 months for a left upper extremity neurolysis of his musculocutaneous  nerve for biceps spasticity given that his last one was done a couple  months ago.      [name], M.D.  Electronically Signed     AEK/MEDQ  D:  11/23/2007 12:56:54  T:  11/24/2007 02:20:06  Job:  [id number]

## 2010-07-30 NOTE — Assessment & Plan Note (Signed)
[name] returns today.  I last saw him for a Botox injection.  This  was performed on February 28, 2008.  He had good result from that.  He  is about 2 months post.  He has typically gotten 70-month relief.   He has had no new problems with his left plantar flexor spasticity,  still having a good result from the prior phenol tibial nerve block.  That procedure was performed on November 23, 2007.   He has had no other medical complications other than some elevation in  his blood pressure.  He has been seeing Dr. Artist Pais for his blood pressure.   In addition, he has complained of some hip and leg pain mainly with  ambulation.  He has no increasing weakness per his report.  His pain is  in the lateral thigh area.  He has had prior knee x-rays which were  normal.  He has not had any hip x-rays at least in my records.   His average pain is 6/10 despite tramadol 200 mg ER once a day and IR 50  mg twice a day.   He has had no other pain medications prescribed per his report.   PHYSICAL EXAMINATION:  VITAL SIGNS:  His blood pressure is 147/90, pulse  69, and respirations 18, O2 sat 94% on room air.  GENERAL:  In no acute distress.  Orientation x3.  Affect alert.  Gait is  with a limp.  He does hip hike on the left side to compensate for the  left footdrop.  EXTREMITIES:  Without edema.  He has Ashworth scale of 1 in the left  finger and wrist flexors and 1 in the ankle plantar flexors as well.  His hip has decreased internal rotation on the left side compared to the  right side.  He has no tenderness to palpation over the greater  trochanter.  No evidence of knee effusion.  Motor strength remains  stable on the left side at 3- at the left deltoid, 4- at the biceps,  triceps, 3 at the grip, and 4 at the hip flexors, knee extensors, and 3-  at the ankle dorsiflexion on the left side, right side is 5/5  throughout.   IMPRESSION:  1. Left spastic hemiplegia due to cerebrovascular accident.  2. Left hip pain, may be due to compensatory gait pattern.  He has      tried ankle-foot orthosis in the past, states he did not really      like it.  He does not want to revisit this.  He has tried      electrical stimulation to the ankle dorsiflexors in the past      without result.  His best relief has been from tibial nerve block      and certainly, seems to have a maintained effect from the previous      block done 5 months ago, usual affect is around 6 months, but this      is variable.   PLAN:  1. We will go ahead and check hip x-rays, given the reduced range of      motion on the left side.  2. We will schedule for left Botox injection forearm flexor muscles,      200 units of Botox.   Consider repeat phenol block.      [name], M.D.  Electronically Signed     AEK/MedQ  D:  04/25/2008 08:56:49  T:  04/25/2008 10:11:48  Job #:  [id number]   cc:   Barbette Hair. [place], DO  [address] [place], [place] [address]   Dr. Darrol Angel

## 2010-08-01 ENCOUNTER — Ambulatory Visit: Payer: Medicare Other | Admitting: Physical Medicine & Rehabilitation

## 2010-08-01 ENCOUNTER — Encounter: Payer: Medicare Other | Attending: Physical Medicine & Rehabilitation

## 2010-08-01 DIAGNOSIS — I69959 Hemiplegia and hemiparesis following unspecified cerebrovascular disease affecting unspecified side: Secondary | ICD-10-CM | POA: Insufficient documentation

## 2010-08-01 DIAGNOSIS — R209 Unspecified disturbances of skin sensation: Secondary | ICD-10-CM | POA: Insufficient documentation

## 2010-08-01 DIAGNOSIS — R42 Dizziness and giddiness: Secondary | ICD-10-CM | POA: Insufficient documentation

## 2010-08-01 DIAGNOSIS — G8110 Spastic hemiplegia affecting unspecified side: Secondary | ICD-10-CM

## 2010-08-02 NOTE — Cardiovascular Report (Signed)
NAMEJAYSTEN, ESSNER               ACCOUNT NO.:  [id number]   MEDICAL RECORD NO.:  [id number]          PATIENT TYPE:  INP   LOCATION:  2910                         FACILITY:  [facility]   PHYSICIAN:  Arturo [name]. Riley Kill, M.D. [facility] OF BIRTH:  Nov 09, 1945   DATE OF PROCEDURE:  11/11/2004  DATE OF DISCHARGE:                              CARDIAC CATHETERIZATION   INDICATIONS:  Mr. Lagace is a 65 year old gentleman who previously has  undergone revascularization surgery. He has subsequently had stents placed  in the native AV circumflex as well as the vein graft to what appears to be  the OM-3. I have carefully reviewed all of his old reports as well as the  operative note. The patient developed chest discomfort after eating the  other night and now presents for reevaluation.   PROCEDURES:  1.  Left heart catheterization.  2.  Selective coronary arteriography.  3.  Selective left ventriculography.  4.  Saphenous vein graft angiography.  5.  Selective left internal mammary angiography.   DESCRIPTION OF PROCEDURE:  The patient was brought to the catheterization  laboratory and prepped and draped in the usual fashion. Through an anterior  puncture, the right femoral artery was easily entered. A 6-French sheath was  initially placed. Views of the left and right coronary arteries were  obtained in multiple angiographic projections. We were able to engage the  vein graft that actually goes to what appears to be the OM-3. We also  appeared to be in what appeared to be an occluded vein graft which has been  previously described on report as occluded vein graft to the acute marginal  branch. A standard right Judkins was used to engage the left internal  mammary. The vein graft to the OM-1 was actually attached to the vein graft  of the diagonal for one proximal anastomosis. Previous reports have  described two patent proximal anastomoses as well as an occluded proximal  anastomoses on the aortic  root, and this is what we found. The  ventriculography was performed in the RAO projection without complication.  He was taken to the holding area in satisfactory clinical condition.   HEMODYNAMIC DATA:  1.  Central aortic pressure 140/74, mean 100.  2.  Left ventricular pressure 140/23.  3.  No gradient on pullback across aortic valve.   ANGIOGRAPHIC DATA:  1.  Ventriculography was performed in the RAO projection. Because of      ventricular ectopy, ejection fraction could not be calculated. However,      EF appeared to be in excess of 55%. There did not appear to be      significant mitral regurgitation.  2.  An aortic root shot was obtained but we could not see the vein graft      takeoff well despite the injection.  3.  There is ostial tapering of the left main that represents about 40%      luminal reduction.  4.  The left anterior descending artery demonstrates about 30-50% narrowing      at the ostium. It then provides a first diagonal branch which is  basically totally occluded. The second diagonal is intact. There is a 70-      80% stenosis in the LAD after the second diagonal with what appears to      be competitive flow from the internal mammary.  5.  The left internal mammary to the left anterior descending artery appears      to be patent.  6.  The proximal circumflex demonstrates what appears to be about 60%      segmental narrowing and slightly hypodense, possibly from some      calcification proximally. Just distal to this location is the previously      placed Taxus stent which appears to be widely patent. There is a tiny      insignificant marginal branch followed by a subtotally occluded first      marginal branch. There is then a second marginal branch and the      antegrade filling of this vessel goes into what likely represents the      equivalent of a third distal marginal branch which is described in the      operative report as a third marginal branch.   7.  The right coronary is a nondominant vessel. The nondominant vessel has      about 50% mid-narrowing and represents what was previously described as      a large acute marginal branch.  8.  There is a saphenous vein graft to this branch which is basically      occluded as described in previous reports.  9.  There is a saphenous vein graft which goes to the diagonal and the OM1.      Both vein grafts appear to be widely patent. One of the vein grafts is      hooded into the other vein graft as a Y-graft without critical      narrowing. Flow into the diagonal which represents the first diagonal      and also the OM-1 appears to be widely patent.  10. There is a saphenous vein graft that terminates to what appears to be      likely a posterolateral or posterior descending branch coming off the      distal circumflex system. This is described as an OM-3 in the operative      report, I believe. There is a stent placed in the very ostium or      proximal portion. In this area is about 50-60% area of narrowing,      although it does not appear to be critical and appears to be reasonably      smooth.   CONCLUSION:  1.  Continued patency of the internal mammary to left anterior descending      artery.  2.  Continued patency of the saphenous vein Y-graft to both the diagonal-1      as well as the obtuse marginal-1.  3.  Continued patency of the saphenous vein graft to what likely represents      and obtuse marginal-3 or possibly a posterior descending artery with      partial narrowing at the previously placed ostial stent.  4.  Nondominant right with moderate mid-narrowing and previously known      occluded vein graft to the acute marginal.   DISPOSITION:  The stent to the proximal portion of the vein graft to the OM-  3 are likely what represents truly a PDA. It is partially renarrowed;  however, it does not appear to be  critical and it is not clear that this would cause symptoms.  Functional testing may be worthwhile as the patient  thinks that the symptoms were different than he has had in the past. I will  discuss the options with the patient.      Arturo [name]. Riley Kill, M.D. [facility]  Electronically Signed     TDS/MEDQ  D:  11/11/2004  T:  11/11/2004  Job:  [id number]   cc:   Willa Rough, M.D.  [address]  [address]  [place]  [place] [address]   Gurnee Bing, M.D. [facility]  [address]  [address]  [place]  [place] [address]   CV Laboratory   Patient's medical record   Maudie Mercury, M.D., F.A.C.C.  [facility]  [place], [place] [address]

## 2010-08-02 NOTE — Procedures (Signed)
NAMEIVAN, LACHER               ACCOUNT NO.:  [id number]  MEDICAL RECORD NO.:  [id number]           PATIENT TYPE:  O  LOCATION:  [facility]                          FACILITY:  [facility]  PHYSICIAN:  [name], M.D.DATE OF BIRTH:  12-11-1945  DATE OF PROCEDURE: DATE OF DISCHARGE:                              OPERATIVE REPORT  PROCEDURE:  Botox injection, right lower extremity.  Informed consent was obtained after describing risks and benefits of the procedure with the patient.  These include bleeding, bruising, and infection, he elects to proceed and has given written consent.  The patient placed prone on exam table.  Betadine prepped to posterior thigh.  Three areas on the medial and three areas on the lateral hamstrings were marked and prepped with Betadine and entered with a 25- gauge 2-inch needle electrode under needle EMG guidance.  After appropriate EMG activity obtained, 25 units of Botox were injected into each site.  The patient tolerated procedure well.  Postprocedure instructions given.     [name], M.D. Electronically Signed    AEK/MEDQ  D:  08/01/2010 14:33:03  T:  08/02/2010 01:01:19  Job:  [id number]

## 2010-08-02 NOTE — Discharge Summary (Signed)
[name], BLUE NO.:  [id number]   MEDICAL RECORD NO.:  [id number]          PATIENT TYPE:  OBV   LOCATION:  2014                         FACILITY:  [facility]   PHYSICIAN:  [name] Rotunda, MD, FACCDATE OF BIRTH:  1945/09/17   DATE OF ADMISSION:  03/05/2006  DATE OF DISCHARGE:  03/06/2006                               DISCHARGE SUMMARY   PRIMARY CARDIOLOGIST:  [name] Cranker, MD, [place].   PRIMARY CARE PHYSICIAN:  [name] Hair. Artist Pais, DO.   PRIMARY NEUROLOGIST:  [name], M.D.   PRINCIPAL DIAGNOSIS:  Chest pain.   SECONDARY DIAGNOSES:  1. Hypertension.  2. Hyperlipidemia.  3. Coronary artery disease status post five-vessel coronary artery      bypass grafting in 2004 in [place] as well as TAXUS stenting of      the circumflex in 2005 in [place].  4. Status post right-sided cerebrovascular accident in 1996 with      residual left-sided weakness.   ALLERGIES:  No known drug allergies.   PROCEDURES:  Adenosine Myoview.   HISTORY OF PRESENT ILLNESS:  A 64 year old white male with prior history  of coronary artery disease and stroke as outlined in the HPI who, since  March 04, 2006, had been experiencing left chest discomfort and  pressure rated between 3 and 5/10 without associated symptoms.  He  presented to the emergency room on March 05, 2006, at [facility] with  constant discomfort as described and was admitted for further  evaluation.   HOSPITAL COURSE:  After questioning, the patient also reported feeling  lousy after his Lyrica was changed to Cymbalta.  He has also had loose  bowels and chills.  As a result, neurology was consulted and seen by Dr.  Sandria [name] who recommended discontinuation of Cymbalta.  From a cardiac  standpoint, his cardiac enzymes were negative, and he underwent an  adenosine Myoview this morning which showed no evidence of ischemia with  normal LV function.  As a result, he is being discharged home this  afternoon in  satisfactory condition.   DISCHARGE LABORATORY DATA:  Hemoglobin 14.3, hematocrit 48. Sodium 139,  potassium 4.6, chloride 107, CO2 20, BUN 20, creatinine 1.1, glucose 91.  PT 13.9, INR 1.0.  Cardiac enzymes negative x2.  TSH 2.282.  Total  cholesterol 106, triglycerides 67, HDL 42, LDL 51.   DISPOSITION:  The patient is being discharged home today in good  condition.   FOLLOWUP PLAN AND APPOINTMENTS:  He is asked to follow up with Dr. Glennon [name], Dr. Artist [name], and Dr. Sandria [name] as previously scheduled.   DISCHARGE MEDICATIONS:  1. Aspirin 81 mg daily.  2. Plavix 75 mg daily.  3. Protonix 40 mg daily.  4. Allopurinol 200 mg daily.  5. Metoprolol 25 mg daily.  6. Lasix 40 mg as previously prescribed.  7. Lipitor 20 mg nightly.  8. Zyrtec 10 mg q.p.m.  9. Nitroglycerin 0.4 mg sublingual p.r.n. chest pain.   OUTSTANDING LAB STUDIES:  None.   DURATION OF DISCHARGE ENCOUNTER:  40 minutes including physician time.      [name] Deer  Brion [name], ANP      [name] Rotunda, MD, [facility]  Electronically Signed    CB/MEDQ  D:  03/06/2006  T:  03/07/2006  Job:  [id number]   cc:   [name] Hair. Artist Pais, DO  [name], M.D.

## 2010-08-02 NOTE — H&P (Signed)
NAMESOLOMAN, MCKEITHAN NO.:  [id number]   MEDICAL RECORD NO.:  [id number]          PATIENT TYPE:  OBV   LOCATION:  2014                         FACILITY[facility]  [facility]   PHYSICIAN:  Flint Melter, MD      DATE OF BIRTH:  04/17/1945   DATE OF ADMISSION:  03/05/2006  DATE OF DISCHARGE:                              HISTORY & PHYSICAL   ADDENDUM:   PHYSICAL EXAM:  SKIN:  Intact without rashes or lesions.  ABDOMEN:  Obese.  Bowel sounds present without organomegaly, masses, or  tenderness.  EXTREMITIES:  Negative cyanosis, clubbing, or edema.  MUSCULOSKELETAL:  Unremarkable.  NEURO:  Unremarkable except for decreased strength on the left upper and  lower extremity.   Chest x-ray showed no active disease.  EKG showed normal sinus rhythm,  left axis deviation, normal intervals, early R wave non-specific ST and  T wave changes; old EKGs are not available.  ISTAT in the ER showed an  H&H of 16.3 and 48, sodium 139, potassium 4.6, BUN 20, creatinine 1.1,  glucose 91.  Point-of-care marker was negative x1.  PT 13.9, INR 1.0.   In the emergency room, he received aspirin which increased his  discomfort.  He was placed on IV nitroglycerin which did not change his  discomfort.  After receiving a GI cocktail, it reduced his discomfort  from a 5 to a 3.   IMPRESSION:  1. Prolonged atypical chest discomfort of uncertain etiology.  2. Hypertension, history per past medical history.   DISPOSITION:  Dr. Myrtis Ser reviewed the patient's past medical history,  spoke with and examined the patient and agrees with the above.  We will  admit him for observation to rule out myocardial infarction.  Availability in the office for a stress test is not open until mid  January; thus, we will perform an adenosine Myoview in the hospital if  he rules out for myocardial infarction in addition to checking our usual  labs.  Dr. Sandria Manly is on call Friday.  He will be contacted Friday morning  for evaluation  given his recent symptoms and medication changes.      Joellyn Rued, PA-C    ______________________________  Flint Melter, MD    EW/MEDQ  D:  03/05/2006  T:  03/06/2006  Job:  [id number]

## 2010-08-02 NOTE — Procedures (Signed)
[name], [name]               ACCOUNT NO.:  [id number]  MEDICAL RECORD NO.:  [id number]           PATIENT TYPE:  O  LOCATION:  TPC                          FACILITY:  [facility]  PHYSICIAN:  [name], M.D.DATE OF BIRTH:  10-04-1945  DATE OF PROCEDURE:  08/01/2010 DATE OF DISCHARGE:                              OPERATIVE REPORT  PROCEDURE:  This is a left upper extremity botulinum toxin injection.  INDICATION:  Spastic hemiplegia, May 29, 2010.  His spasticity only partially responsive to oral medications and other conservative care, interferes with activity.  Informed consent was obtained after describing risks and benefits of the procedure with the patient.  These include bleeding, bruising, and infection.  He elects to proceed and has given written consent.  This is a left FDR, left FCR, left FDS, left pronator teres, marked and prepped with Betadine, entered with a 25-gauge 2-inch needle electrode under needle EMG guidance.  After appropriate EMG activity was obtained, 25 units were injected into each of two sites in the left FCR, 25 units into each of two sites in left FDS, and 50 units into one site left pronator teres.  The patient tolerated procedure well.  Postprocedure instructions given.     [name], M.D. Electronically Signed    AEK/MEDQ  D:  08/01/2010 14:34:43  T:  08/02/2010 16:10:96  Job:  [id number]

## 2010-08-02 NOTE — Op Note (Signed)
[name], [name]               ACCOUNT NO.:  [id number]   MEDICAL RECORD NO.:  [id number]          PATIENT TYPE:  AMB   LOCATION:  SDS                          [facility]ACILITY:  [facility]   PHYSICIAN:  Kristine Garbe. Ezzard Standing, M.D.DATE OF BIRTH:  [birth year]   DATE OF PROCEDURE:  09/18/2005  DATE OF DISCHARGE:                                 OPERATIVE REPORT   PREOPERATIVE DIAGNOSES:  1.  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  2.  Turbinate hypertrophy.   POSTOPERATIVE DIAGNOSES:  1.  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  2.  Turbinate hypertrophy.   OPERATION PERFORMED:  Septoplasty with bilateral inferior turbinate  reductions.   SURGEON:  Kristine Garbe. Ezzard Standing, M.D.   ANESTHESIA:  General endotracheal.   COMPLICATIONS:  None.   BRIEF CLINICAL NOTE:  [name] is a 65 year old gentleman who has had  history of sinus problems and nasal obstruction, especially on the right  side.  His CT of his sinuses showed only minimal ethmoid sinus disease with  clear paranasal sinuses otherwise.  Of note, he does have a significant  septal deviation to the right with obstructed right nasal airway.  He is on  blood thinners, aspirin and Plavix.  He is taken to the operating room at  this time for a septoplasty and turbinate reductions and was instructed to  stop the aspirin and Plavix for 4 days preoperatively and 2 days  postoperatively.   DESCRIPTION OF PROCEDURE:  After adequate endotracheal anesthesia, the  patient received 1 g of Ancef IV preoperatively.  The nose was prepped with  Betadine solution and draped out with sterile towels.  The nose was then  further prepped with cotton pledgets soaked in decongestant and the septum  and turbinates were injected with Xylocaine with epinephrine.  A  hemitransfixion incision was made along the caudal edge of the septum on the  right side and mucoperichondrial and mucoperiosteal flaps were elevated  posteriorly.  The patient had severe deviation or fracture of the septum  anteriorly on the right side.  Mucoperiosteal flaps were elevated on either  side of the deviation.  The deviated portion of the septum was removed.  In  addition, some of the bony septum more posteriorly on the right side was  bowed to the right side and this was removed; this completed the septoplasty  portion of the procedure.  Moving the septum back more to midline partially  occluded the left airway and the left inferior turbinate was reduced by  submucosal cauterization.  The turbinate as then out-fractured; the right  turbinate was likewise out-fractured and cauterized.  This completed the  procedure.  The nose was packed with Telfa soaked in Bacitracin ointment.  The patient was awoken from anesthesia and transferred to recovery room  postop doing well.   DISPOSITION:  [name] is discharged home later this morning.  He will restart  his Plavix and aspirin in 2 days.  He is given Tylenol and Vicodin p.r.n.  pain, Keflex 500 mg b.i.d. for 5 days and we will have him follow up in my  office tomorrow to have his nasal packs removed.           ______________________________  Kristine Garbe Ezzard Standing, M.D.     CEN/MEDQ  D:  09/18/2005  T:  09/18/2005  Job:  [id number]   cc:   Thomos Lemons, D.O. [facility]  [address] [place], [place] [address]

## 2010-08-02 NOTE — Assessment & Plan Note (Signed)
[facility]                             PULMONARY OFFICE NOTE   NAME:[name], Bena                      MRN:          [id number]  DATE:06/17/2006                            DOB:          [birth year]    PRIMARY PHYSICIAN:  Dr. Thomos Lemons.   ENT:  Dr. Narda Bonds.   PROBLEM:  1. Obstructive sleep apnea.  2. Atherosclerosis/cerebrovascular accident/coronary bypass.  3. Hypertension.  4. Gout.   HISTORY:  Last here in November.  He wakes occasionally with his CPAP  mask off, but is more comfortable at pressure 14 and feels the mask fit  is appropriate now.  It helps to prop up a little on 2 pillows.  He took  a recent prednisone taper from Dr. Ezzard Standing for nasal congestion and feels  clear.  He had had a septoplasty last summer.  Saline lavage does help.  He asks for a gel to use in his nose.   MEDICATIONS:  1. CPAP 14.  2. Ambien 5 mg p.r.n.  3. Aspirin 81 mg.  4. Allopurinol 200 mg x2.  5. Plavix 75 mg.  6. Toprol XL 25 mg.  7. Lipitor 20 mg.  8. Protonix 40 mg.  9. Zyrtec 10 mg.  10.Metformin 500 mg b.i.d.  11.Furosemide 40 mg every other day.  12.Nortriptyline for leg pain.  13.NTG and Nasacort p.r.n. use.   No medication allergy.   OBJECTIVE:  Weight 269 pounds, BP 130/80, pulse 72, room air saturation  98%.  He is alert.  Left hemiparesis.  Somewhat overweight.  His nasal airway is unobstructed now.  Lung fields are clear.  Pulse regular.   IMPRESSION:  Obstructive sleep apnea is adequately controlled.  Mild  rhinitis is basically well enough controlled, but he can try a nasal  saline gel.   PLAN:  Continue continuous positive airway pressure 14, try nasal saline  gel, wear dust mask for outdoor work.  Schedule return 1 year, earlier  p.r.n.     Clinton D. Maple Hudson, MD, [facility], FACP  Electronically Signed    CDY/MedQ  DD: 06/17/2006  DT: 06/17/2006  Job #: [id number]

## 2010-08-02 NOTE — Assessment & Plan Note (Signed)
[facility]                              CARDIOLOGY OFFICE NOTE   NAME:[name], [name]                      MRN:          [id number]  DATE:11/20/2005                            DOB:          [birth year]    The patient is a very pleasant 65 year old white, married male with history  of coronary artery disease, CABG three and a half years ago, percutaneous  intervention with stenting of 70% stenosis, proximal circumflex with a Taxus  stent, #[id number].  Followup catheterization, Jul 17, 2004, revealed patency of  the stent and patent grafts, except for the saphenous vein graft to the  posterior descending artery, which had a 50-60% lesion.  LV was normal.  The  patient has had some occasional atypical left chest discomfort, which does  not sound like angina.  His biggest problem recently has been that of kidney  stones.  He was seen in the emergency room on August 15, apparently had  passed a stone, but still has one in the right kidney or ureter.  He was to  follow up with Dr. Vic Blackbird, but apparently this never happened.  The patient is still having pain, particularly when he sneezes or coughs.  His renal profile at that time was normal.   MEDICATIONS INCLUDE:  1. Aspirin 81.  2. Allopurinol 400 daily.  3. Plavix 75.  4. Toprol XL 25.  5. Lipitor 20.  6. Protonix 40.  7. Zyrtec.  8. Lyrica.  9. Metformin 500 twice daily.   PHYSICAL EXAMINATION:  VITAL SIGNS:  Blood sugar is 122/76, pulse 53, sinus  bradycardia.  GENERAL APPEARANCE:  Normal.  NECK:  JVP is not elevated.  Carotid pulses __________  without bruits.  LUNGS:  Clear.  CARDIAC EXAM:  Normal.  ABDOMINAL EXAM:  Normal.  EXTREMITIES:  Normal.   EKG reveals sinus bradycardia, otherwise normal.   IMPRESSION/DIAGNOSIS:  1. Coronary artery disease with prior CABG, as noted above.  2. Renal stones, as per above note.  3. Hyperlipidemia; followup to be obtained.  4. Gout, on  therapy.   We are calling Dr. Aldean Ast to set up an appointment in the near future.  I  will plan to see him in three months or p.r.n.                              E. Graceann Congress, MD, [facility]    EJL/MedQ  DD:  11/20/2005  DT:  11/20/2005  Job #:  [id number]

## 2010-08-02 NOTE — H&P (Signed)
NAMEGILAD, [name] NO.:  [id number]   MEDICAL RECORD NO.:  [id number]          PATIENT TYPE:  OBV   LOCATION:  2014                         FACILITY[facility]  [facility]   PHYSICIAN:  Joellyn Rued, PA-C     DATE OF BIRTH:  05-Sep-1945   DATE OF ADMISSION:  03/05/2006  DATE OF DISCHARGE:                              HISTORY & PHYSICAL   SUMMARY OF HISTORY:  Mr. Nobrega is a 65 year old white male who presents  to [facility] emergency room complaining of chest discomfort.  He stated  that he saw Dr. Sandria Manly last Friday, and his Lyrica was changed to Cymbalta  secondary to increased headaches that he felt was maybe associated with  Lyrica; however, since changing this medication, the patient has  continued to feel lousy.  He has experienced some loose bowels, which  have resolved in the last day or two, and some chills along with  continuing headaches.  Yesterday afternoon while trying to relax, he  gradually developed a left anterior chest heavy brick-like sensation.  This did not radiate, nor was it associated with shortness of breath or  diaphoresis.  He stated the discomfort has waxed and weaned since  yesterday between a 3 and a 5.  The last time it was a zero was before  onset.  He cannot recall any relieving or aggravating factors, or if it  resembles his symptoms associated with coronary artery disease.  He did  try taking some Rolaids without relief.  Since this morning, he has  experienced intermittent nausea, as well as a headache, thus his  presentation to the hospital secondary to continuing symptoms.   ALLERGIES:  No known drug allergies.   MEDICATIONS:  Prior to admission include:  1. Allopurinol 200 mg daily.  2. Plavix 75 daily.  3. Protonix 40 mg daily.  4. Aspirin 81 daily.  5. Metoprolol 25 daily.  6. Metformin 500 mg b.i.d. for weight loss.  7. Cymbalta 30 mg daily.  8. Furosemide 40 mg every other day.  9. Nitroglycerin 0.4 p.r.n.  10.Lipitor 20 mg  q.h.s.  11.Zyrtec 10 mg q.h.s.   PAST MEDICAL HISTORY:  Notable for:  1. 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      He has residual left sided weakness and numbness.  2. Hypertension, which he does not check his blood pressure at home.  3. Hyperlipidemia, uncertain last check.  4. Gout.  5. Known coronary artery disease with a remote myocardial infarction,      specifics unknown.  6. He had 5-vessel bypass surgery in 2004 in [place], and a Taxus      stent to the circumflex in 2005 in [place].  7. Last catheterization was on November 11, 2005, which revealed an EF      55%, patent LIMA to the LAD, patent saphenous vein graft to the OM1      and diagonal.  He had a 50% to 60% ostial saphenous vein graft to      the OM3 at a prior stent site, and a total saphenous  vein graft to      the RCA, which was felt to be old, with a 50% native RCA.  8. Last adenosine Myoview was performed post the cardiac      catheterization on November 20, 2004.  This showed an EF of 69%,      possible inferobasilar scar with mild periinfarct ischemia.  9. He also has a history for obstructive sleep apnea.  Sleep study on      November 26, 2005, was positive for mild obstruction, and now he      uses a CPAP.   PAST SURGICAL HISTORY:  Notable for right ganglion cyst removal.   SOCIAL HISTORY:  He resides in [place] with his wife.  He has 4  grown children.  He is on disability.  He has never smoked.  He denies  alcohol, drug use, herbal medications.  He states that he adheres to a  low-salt diet.  He does not exercise.  Very rarely, he might use a  stationary bike.   FAMILY HISTORY:  His mother died at age of 81 secondary to a respiratory  illness, history of diabetes.  Father at 93 with lung cancer and  rheumatic heart disease.  Two sisters and one brother alive and well.   REVIEW OF SYSTEMS:  In addition to above, it is notable for chills,  glasses, headaches,  diarrhea.  Last bowel movement was this morning and  nonremarkable.  GERD and abdominal discomfort that has been also  intermittent for several days.   PHYSICAL EXAMINATION:  GENERAL:  Well-nourished, well-developed,  pleasant, obese white male in no apparent distress.  VITAL SIGNS:  Temperature 97.8, blood pressure 146/75, pulse 58,  respirations 22, 95% sat on room air.  HEENT:  Unremarkable.  NECK:  Supple, without thyromegaly, adenopathy, JVD or carotid bruits.  HEART:  PMI is not displaced.  Distant heart sounds.  Regular rate and  rhythm without murmurs, rubs, clicks or gallops.  LUNGS:  Symmetrical excursion.  Decreased breath sounds, but clear to  auscultation.  SKIN:  Integument was intact without rashes or lesions.  ABDOMEN:  Obese.  Bowel sounds presents without organomegaly, masses, or  tenderness.   Dictation ended at this point.      Joellyn Rued, PA-C     EW/MEDQ  D:  03/05/2006  T:  03/06/2006  Job:  [id number]   cc:   Barbette Hair. Artist Pais, DO  Cecil Cranker, MD, [facility]  Clinton D. Maple Hudson, MD, FCCP, [facility] Eaton. Love, M.D.  Kristine Garbe. Ezzard Standing, M.D.

## 2010-08-02 NOTE — Discharge Summary (Signed)
NAMEEUSEBIO, BLAZEJEWSKI               ACCOUNT NO.:  [id number]   MEDICAL RECORD NO.:  [id number]          PATIENT TYPE:  INP   LOCATION:  2023                         FAC[facility]  [facility]   PHYSICIAN:  Cecil Cranker, M.D.DATE OF BIRTH:  May 20, 1945   DATE OF ADMISSION:  11/08/2004  DATE OF DISCHARGE:  11/13/2004                                 DISCHARGE SUMMARY   PRINCIPAL DIAGNOSIS:  Abdominal pain.   OTHER DIAGNOSES:  1.  Coronary artery disease status post coronary artery bypass graft in 2004      in [place], [place].  2.  Hypertension.  3.  Hyperlipidemia.  4.  History of cerebrovascular accident in 1996.  5.  Gout.   PRIMARY CARE PHYSICIAN:  Dr. Artist Pais   PRIMARY CARDIOLOGIST:  Dr. Celso Sickle [place]   ALLERGIES:  No known drug allergies.   PROCEDURE:  1.  Left heart cardiac catheterization.  2.  Gallbladder ultrasound.   HISTORY OF PRESENT ILLNESS:  A 65 year old white male with prior history of  CAD status post CABG in [place], [place] in 2004.  He recently moved to  the [place] area approximately three weeks ago.  He has been experiencing  mild exertional chest discomfort that has been relieved by rest.  However,  on the day of admission he had persistent chest discomfort throughout the  day.  By that point he decided to present to the [facility] ED for further  evaluation.  Patient ruled out for MI and did not have any significant ECG  changes and was admitted for further evaluation.   HOSPITAL COURSE:  Patient underwent left heart cardiac catheterization on  August 67 which revealed patent vein graft to the D1 and OM2, patent LIMA to  the LAD, and a patent vein graft to the OM3 with a 50-60% stenosis  proximally which was felt to be non-obstructive.  As there were no targets  for PCI, patient was initiated on PPI therapy.  Following catheterization he  continued to complain of not chest discomfort, but abdominal discomfort that  was worse after meals.  He  underwent gallbladder ultrasound which showed  fatty liver and otherwise no acute findings.  He is being discharged home  today in satisfactory condition with follow-up established with Dr. Terrial Rhodes of [facility] on September 20 at 11 a.m. for further  evaluation of postprandial abdominal discomfort.  He also has follow-up with  Dr. Graceann Congress with [facility] and is set up for a functional  study September 6 to evaluate the ischemic significance of the 50-60%  stenosis in the vein graft to the OM3.   DISCHARGE LABORATORIES:  Hemoglobin 13.1, hematocrit 37.6, WBC 5.5,  platelets 124, MCV 87.  Sodium 138, potassium 3.8, chloride 103, CO2 29, BUN  13, creatinine 1.1, glucose 105.  PT 14.6, INR 1.1, PTT 31.  Cardiac enzymes  are negative x2.  Total cholesterol 119, triglycerides 82, HDL 39, LDL 64,  calcium 8.5.  Fecal occult blood was negative.   DISPOSITION:  Patient is being discharged home in good condition.   FOLLOW-UP  PLANS AND APPOINTMENTS:  He has an exercise Myoview set up for  November 23, 2004 at 8 a.m. at [facility].  He has follow-up  appointment with Dr. Cecil Cranker on September 27 at 2:15 p.m.  He has  follow-up with Dr. Mitzie Na in gastroenterology on September 20 at 11  a.m.   DISCHARGE MEDICATIONS:  1.  Aspirin 81 mg daily.  2.  Plavix 75 mg daily.  3.  Toprol XL 25 mg daily.  4.  Altace 5 mg daily.  5.  Lipitor 20 mg q.h.s.  6.  Neurontin 900 mg q.a.m., 600 mg q.p.m., 900 mg q.h.s.  7.  Allopurinol 200 mg daily.  8.  Protonix 40 mg daily.  9.  Nitroglycerin 0.4 mg sublingual p.r.n. chest pain.   OUTSTANDING LABORATORY STUDIES:  None.   DURATION OF DISCHARGE ENCOUNTER:  60 minutes including physician time.      [place] Anis, NP    ______________________________  E. Graceann Congress, M.D.    CRB/MEDQ  D:  11/13/2004  T:  11/13/2004  Job:  [id number]   cc:   Ulyess Mort, M.D. [facility]   Thomos Lemons,  D.O. [facility]  [address] [place], [place] [address]

## 2010-08-02 NOTE — Assessment & Plan Note (Signed)
[facility]                            CARDIOLOGY OFFICE NOTE   NAME:Hechavarria, KAWON WILLCUTT                      MRN:          [id number]  DATE:03/18/2006                            DOB:          06-19-1945    Mr. Dulworth is a very pleasant 65 year old white male with prior history  of coronary artery disease and CVA with left hemiparesis.  He had CABG  in 2004 with five-vessel grafting.  He also had a Taxus stent of the  circumflex in 2005.  He had a catheterization here by Arturo [name]. Riley Kill,  MD, [facility] in February of 2006 revealing a patent LIMA to LAD, patent SVG  to diagonal, patent SVG to OM, and 50-60% lesion in SVG to the posterior  descending with normal LV.   The patient is recently admitted with chest pain, hypertension, he also  has a history of hyperlipidemia.  He was admitted on December 20 to  December 21.  He had negative enzymes and his Myoview revealed no  ischemia, normal LV.  He has had no recurrent symptoms.  He is having  some headaches and is to see Dr. Sandria Manly for follow-up tomorrow.   EKG reveals normal sinus rhythm with left anterior hemiblock, moderate  LVH.   MEDICATIONS:  1. Ambien 5 mg.  2. Aspirin 81 mg.  3. Allopurinol 400 mg daily.  4. Plavix 75 mg.  5. Toprol XL 25 mg.  6. Lipitor.  7. Protonix 40 mg.  8. Zyrtec 10 mg.  9. Furosemide 40 mg every other day.  10.Metformin 500 mg b.i.d.   PHYSICAL EXAMINATION:  VITAL SIGNS:  Blood pressure 122/78, pulse 65 in  normal sinus rhythm.  GENERAL:  Normal.  HEENT:  JVP is not elevated.  Carotid pulses are palpable without  bruits.  LUNGS:  Clear.  HEART:  No murmur.  ABDOMEN:  Unremarkable.  EXTREMITIES:  __________.   IMPRESSION:  Diagnoses as above.  The patient appears to be stable.  He  is to continue on the same therapy.  I will see him back in 3-4 months  or p.r.n.  Following that he would like to see Dr. Riley Kill who did his  previous catheterization.     Cecil Cranker, MD, [facility]  Electronically Signed    EJL/MedQ  DD: 03/18/2006  DT: 03/18/2006  Job #: [id number]

## 2010-08-02 NOTE — Procedures (Signed)
NAME:  [name], [name]               ACCOUNT NO.:  [id number]   MEDICAL RECORD NO.:  [id number]          PATIENT TYPE:  OUT   LOCATION:  SLEEP CENTER                 FACILITY:  [facility]   PHYSICIAN:  Clinton D. Maple Hudson, MD, FCCP, FACPDATE OF BIRTH:  04/14/1943   DATE OF STUDY:  11/26/2005                              NOCTURNAL POLYSOMNOGRAM   REFERRING PHYSICIAN:  Dr. Jetty Duhamel   INDICATIONS FOR STUDY:  Hypersomnia with sleep apnea.   EPWORTH SLEEPINESS SCORE:  12/24.   BMI:  32.   WEIGHT:  263 pounds.   HOME MEDICATION:  Allopurinol, Plavix, Protonix, aspirin, metoprolol,  metformin, Lyrica, Lipitor, Zyrtec, Lasix, nasal spray.   SLEEP ARCHITECTURE:  Total sleep time 392 minutes with sleep efficiency 96%.  Stage I was 5%, stage II 80%, stages III and IV were absent, REM 15% of  total sleep time.  Sleep latency 5 minutes, REM latency 151 minutes, awake  after sleep onset 11 minutes, arousal index 4.  The patient took Lyrica,  Lipitor, metformin, furosemide, Zyrtec and Nasacort at 9:30 p.m..   RESPIRATORY DATA:  Split study protocol.  Apnea/hypopnea index (AHI, RDI)  18.1 obstructive events per hour indicating moderate obstructive sleep  apnea/ hypopnea syndrome.  This included 30 obstructive apneas and 23  hypopneas before CPAP.  Most sleep and most events were recorded while  supine.  REM AHI 30 per hour.  CPAP was titrated to 15 CWP, a AHI 0 per  hour.  A large ResMed ultra mirage full-face mask was used with heated  humidifier.   OXYGEN DATA:  Moderate snoring with oxygen desaturation to a nadir of 58%.  After CPAP control oxygen saturation held 95% on room air.   CARDIAC DATA:  Sinus rhythm with occasional PAC.   MOVEMENT/PARASOMNIA:  Occasional limb jerk with arousal 1.1 per hour,  insignificant.   IMPRESSION/RECOMMENDATIONS:  1. Moderate obstructive sleep apnea hypopnea syndrome, AHI 18.1 per hour      with all sleep and events recorded while supine.  Moderate snoring  with      oxygen desaturation to 58%.  2. Successful CPAP titration to 15 CWP, AHI 0 per hour.  A large ResMed      ultra mirage full-face mask was used      with heated humidifier.  3. Minimal periodic limb movement with arousal, 1.1 per hour.      Clinton D. Maple Hudson, MD, [facility], FACP  Diplomate, Biomedical engineer of Sleep Medicine  Electronically Signed     CDY/MEDQ  D:  11/30/2005 13:19:39  T:  12/01/2005 12:38:04  Job:  [id number]

## 2010-08-02 NOTE — Assessment & Plan Note (Signed)
[facility]                            CARDIOLOGY OFFICE NOTE   NAME:Deskin, KANON NOVOSEL                      MRN:          [id number]  DATE:06/30/2006                            DOB:          Feb 18, 1946    [name] is a very pleasant 65 year old white male with a history of  coronary artery disease, a CABG in 2004 by 5 vessel bypass grafting.  He  has a Taxus stent in the circumflex in 2005.  These were done in [place].   Dr. Riley Kill did catheterization on him in February 2006, revealing  patent LIMA to LAD,  patent SVG to diagonal, patent SVG to OM and a 50%  to 60% lesion in the SVG in the posterior descending .  The patient had  previous history of CVA with left hemiparesis.  The patient also has a  history of hypertension and hyperlipidemia.  He was admitted on December  20 with chest pain and had negative enzymes and a Myoview revealed no  ischemia with normal LV.  The patient had some headaches and dizziness.  He is seeing Dr. Sandria Manly.  He has had no recent chest discomfort.   MEDICATIONS:  Include:  1. Ambien 5.  2. Aspirin 81.  3. Allopurinol.  4. Plavix 75.  5. Toprol XL 25.  6. Lipitor 20.  7. Protonix 40.  8. Zyrtec 10.  9. Furosemide 40 mg daily.  10.Metformin 850 b.i.d. with meals.  11.Cymbalta 30 nightly.   Blood pressure 130/74, pulse 61, normal sinus rhythm.  General appearance is normal.  JVD is not elevated.  Carotid pulse palpated without bruits.  LUNGS:  Are clear to percussion and auscultation.  CARDIAC:  Exam is normal with no murmur or gallop.  ABDOMEN:  Exam reveals a pulsatile aorta, possibly enlarged.  EXTREMITIES:  Reveal no edema.   IMPRESSION:  1. Coronary artery disease with previous details as above -      asymptomatic.  2. Hypertension controlled.  3. Hyperlipidemia controlled.  4. Prior cerebrovascular accident.  5. Rule out abdominal aneurysm.  6. Obstructive sleep apnea on CPAP.   We plan to follow  up his lipids. LFTs, BMP.  Also he should have an  abdominal ultrasound to rule out abdominal aortic aneurysm.   Should note his EKG reveals normal sinus rhythm with left axis deviation  and minor T change.   Suggest he follow up with Dr. Gala Romney in 3 or 4 months or p.r.n.     E. Graceann Congress, MD, [facility]  Electronically Signed    EJL/MedQ  DD: 06/30/2006  DT: 06/30/2006  Job #: [id number]

## 2010-08-02 NOTE — Discharge Summary (Signed)
NAMEKONSTANTINOS, CORDOBA               ACCOUNT NO.:  [id number]   MEDICAL RECORD NO.:  [id number]          PATIENT TYPE:  OBV   LOCATION:  6524                         FACILITY:  [facility]   PHYSICIAN:  Arvilla Meres, M.D. LHCDATE OF BIRTH:  1945/08/05   DATE OF ADMISSION:  08/18/2005  DATE OF DISCHARGE:                           DISCHARGE SUMMARY - REFERRING   DISCHARGE DIAGNOSES:  1.  Prolonged atypical chest discomfort, probably musculoskeletal by exam.  2.  Lightheadedness.  3.  Upper extremity neuralgias of uncertain etiology.  4.  Recent sinus infection.  5.  History as noted below.   SUMMARY OF HISTORY:  Mr. Rossitto is a 65 year old white male who presented to  [facility] Room on August 18, 2005 with multiple complaints  consisting of left arm hot and cold flashes as well as numbness, left chest  discomfort which he described as a constant pressure for the preceding 24  hours, light headedness that has been present for over one year and sinus  congestion.   PAST MEDICAL HISTORY:  1.  Is notable for right CVA in 1996 and according to records may be related      to an aneurysmal hemorrhage with residual left-sided weakness and      numbness.  2.  History of hypertension.  3.  Hyperlipidemia.  4.  Gout.  5.  Known coronary artery disease with remote MI, five-vessel bypass surgery      in 2004 in [place], Taxus stenting to the circumflex in 2005 in [place]; last catheterization in August 2006 showed EF of 55% and slight      progression of coronary artery disease.  However, associated adenosine      Myoview did not show any significant ischemia.  6.  Possible obstructive sleep apnea.  He is scheduled for evaluation on      September 16, 2005.  7.  Obesity.   LABORATORY:  Admission H&H of 15.0 and 44.0, normal indices, platelets 136,  WBCs 6.6.  Subsequent hematology was unremarkable.  Sodium 139, potassium  4.1, BUN 19, creatinine 1.0, glucose 70, normal LFTs.   Hemoglobin A1c 5.7.  CK-MBs, relative indexes and troponins were negative x3.  Fasting lipids  showed a total cholesterol of 117, triglycerides 158, HDL 33, LDL 52.  TSH  2.902.  Chest x-ray revealed low volume film with borderline cardiomegaly  and mild vascular congestion.  EKG showed sinus bradycardia, left axis  deviation, nonspecific EKG changes.   HOSPITAL COURSE:  Mr. Guerrero was admitted to the hospital initially for  observation to rule out myocardial infarction.  Given his atypical chest  discomfort with his neurological complaints, neurology consult was obtained  with Dr. Sandria Manly.  Orthostatic blood pressures were performed and did not  reveal any changes with his blood pressure or pulse.  Neurology increased  his Lyrica to 150 t.i.d. by August 20, 2005.  The patient stated that he  continued to have the complaint; however, he felt better.  Dr. Gala Romney  felt that from a cardiac standpoint he could be discharged with further  evaluation  of his symptoms.   DISPOSITION:  Mr. Demaria is discharged home; asked to maintain low salt, fat  and cholesterol diet; to continue with his weight loss program under the  supervision of Dr. Artist Pais.  He was asked to bring all medicines to all  appointments.  New medications include Avelox 400 mg q.d. for the next 10  days.  He was asked to increase his Lyrica to 150 mg t.i.d.  He will  continue on Lipitor 20 mg q.h.s., Zyrtec 10 mg q.d., fluticasone nasal spray  as previously, metformin 500 mg b.i.d. for weight loss, allopurinol 200 mg  q.d., Plavix 75 q.d., Protonix 40 q.d., aspirin 81 q.d., Lasix 40 mg every  other day, nitroglycerin 0.4 p.r.n. and metoprolol 25 mg q.d.  He will have  a stress Myoview on August 28, 2005 at 8:30 a.m. and a follow-up with Dr. Glennon Hamilton on September 03, 2005 at 1445.  He was asked to follow up as scheduled  with Dr. Shelle Iron for a sleep study, Dr. Sandria Manly for further neurological  evaluation and Dr. Artist Pais as necessary.  He will see  Dr. Ezzard Standing ENT on August 28, 2005 at 2:30 p.m. to follow up in regards to his sinus issues.      Joellyn Rued, P.A. [facility]      Arvilla Meres, M.D. [facility]  Electronically Signed    EW/MEDQ  D:  08/20/2005  T:  08/20/2005  Job:  [id number]   cc:   Cecil Cranker, M.D.  [address]  [address]  [place]  [place] [address]   Genene Churn. Love, M.D.  Fax: [phone]   Thomos Lemons, D.O. [facility]  [address] [place], [place] [address]   Marcelyn Bruins, M.D. [facility]  [address]  [place]  [place] [address]   Kristine Garbe. Ezzard Standing, M.D.  Fax: [phone]

## 2010-08-02 NOTE — Consult Note (Signed)
[name], [name] NO.:  [id number]   MEDICAL RECORD NO.:  [id number]          PATIENT TYPE:  OBV   LOCATION:  2014                         FACILITY:  [facility]   PHYSICIAN:  Genene Churn. Love, M.D.    DATE OF BIRTH:  22-Feb-1946   DATE OF CONSULTATION:  03/06/2006  DATE OF DISCHARGE:  03/06/2006                                 CONSULTATION   REASON FOR CONSULTATION:  This 65 year old left-handed white married  male from [place], [place] is seen for evaluation of left-  sided pain possibly secondary to the use of Cymbalta.   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  He has had stent placement.  He has never had  hyperlipidemia or cigarette use.  His last cardiac catheterization was  November 11, 2005.  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  He had been on  gabapentin in the past 300 mg two in the morning and three at bedtime  but did not do well on that medication and was changed Lyrica in March  2007.  Those built up to a total of 100 mg three times per day but  developed headaches.  One week ago he was changed from  Lyrica to Cymbalta and was on 30 mg per day.  Yesterday he noted the  onset of discomfort occurring in his chest, he had increasing discomfort  on the left side of his face, arm and leg and was seen at the hospital  for possibility of coronary chest pain.  Blood studies showed no  evidence of a myocardial infarction and it was suspected that his old  problem of gastroesophageal reflux disease may be being exacerbated by  the use of Cymbalta.  The  patient's left-sided symptoms are primarily  numbness in the past.  He has had some discomfort but it is hard to say  true neuropathic pain.   MEDICATIONS:  His medications at the time of admission are allopurinol  200 mg daily, Plavix 75 mg daily, Protonix 40 mg daily, aspirin 81 mg  daily, metoprolol 25 mg daily, metformin 500 mg b.i.d., Cymbalta 30 mg  daily, furosemide 40 mg every other day, nitroglycerin 0.4 mg p.r.n.  chest pain, Lipitor 20 mg q.h.s., Zyrtec 10 mg q.h.s.  He has been on  the metformin for weight loss and the Lipitor was recently added as  well.   PAST MEDICAL HISTORY:  His past medical history is significant for right  brain stroke in July 1996 with residual left hemiparesis, five-vessel  coronary artery bypass surgery in 2004, stent placement in the  circumflex in 2005, recent catheterization November 11, 2005, gout,  hypertension, possible hyperlipidemia, gastroesophageal reflux disease.   EXAMINATION:  Well-developed white male.  Blood pressure right and left  arm 140/80, heart rate was 64 and regular, there were no bruits.  He was  alert and oriented x3.  His cranial nerve examination revealed visual  fields full, disks flat, the extraocular movements are full, corneals  are present, he had a left facial tongue deviated to the left,  sternocleidomastoid and trapezius testing were normal.  Motor  examination with increased tone in his left hand and arm and in his left  leg with clumsiness his left hand and arm, he had decreased rapid  alternating movement skills in his left hand and arm, mild left hand  distal drift, he had clumsiness with the left hand and finger-to-nose,  he had an outstretched hand and arm tremor bilaterally, he had decreased  left arm swing, circumducted his left leg and had a slight left foot  drop when he walked.  He had increased deep tendon reflexes on the left.  He had left plantar response which was upgoing, right plantar response   which was downgoing.  He had altered sensation of the left face, arm and  leg to pinprick.  He had decreased two-point discrimination in his left  hand.   IMPRESSION:  1. Left-sided numbness secondary to right thalamic involvement with      right brain stroke in 1996, code 782.0 and 434.01.  2. New onset chest pain possibly secondary to gastroesophageal reflux      disease rather than coronary artery disease from the use of      Cymbalta aggravating gastroesophageal reflux disease, code 995.2.  3. Hypertension, code 796.2.  4. Coronary artery disease, code 429.2.  5. Diabetes mellitus, code 250.60.  6. Hyperlipidemia, code 272.4.   Recommendation at this time is to hold Cymbalta, major symptoms  neurologically had been numbness rather than pain and there is no  definite evidence that this needs to be treated at this time.           ______________________________  Genene Churn. Sandria Manly, M.D.     JML/MEDQ  D:  03/06/2006  T:  03/06/2006  Job:  [id number]

## 2010-08-02 NOTE — Assessment & Plan Note (Signed)
[facility]                               PULMONARY OFFICE NOTE   NAME:[name], [name]                      MRN:          [id number]  DATE:11/24/2005                            DOB:          [birth year]    PULMONARY SLEEP MEDICINE CONSULT   PROBLEM:  Sleep Medicine consultation at the kind request of Dr. Thomos Lemons  for this 65 year old gentleman who complains of nonrestorative sleep, waking  as tired as he went to bed.   HISTORY:  He says that for the past year and a half at least, he has been  aware that he wakes as tired as he went to bed.  He has some sense that  sleep is restless.  His wife wears hearing aids during the day and he says  she would not be able to hear him snore.  He has not been told that he  snores or that he kicks his legs.  He is disturbed by a headache and by left  arm discomfort when his pain medications wear off during the night but this  is not new.  Bed time is around 11:00 p.m., estimating 1 minute to fall  asleep and aware of waking twice during the night before finally up around  6:00 a.m. or 7:00 a.m.   REVIEW OF SYSTEMS:  Snoring.  Sleepiness when driving.  Wakes fatigued.  Needs daytime naps.  His weight has been labile within a range of about 24  pounds.  He denies confusion or syncope.  Denies waking with choking reflux  or obvious breathing discomfort.   MEDICATIONS:  1. Aspirin 81 mg.  2. Allopurinol 200 mg x2.  3. Plavix 75 mg.  4. Toprol XL 25 mg.  5. Lipitor 20 mg.  6. Protonix 40 mg.  7. Nasacort.  8. Lyrica 150 mg t.i.d.  9. Zyrtec 10 mg.  10.Nitroglycerin p.r.n.  11.Furosemide p.r.n.   ALLERGIES:  No medication allergy.   PAST HISTORY:  1. Coronary artery disease.  2. Coronary bypass graft.  3. Stents.  4. Hypertension.  5. Cerebrovascular accident 1996, leading to headaches and left arm      discomfort.  6. Heart rhythm problems of uncertain type.  7. Nasoseptoplasty by Dr. Narda Bonds early this year with subsequent      nasal breathing much easier.  He still has his tonsils.  He denies      history of lung disease or thyroid disorder.   SOCIAL HISTORY:  Never smoked, married, retired.   FAMILY HISTORY:  Father died of lung cancer due to cigarette smoking.  Mother died of a cerebrovascular accident and diabetes.  He thinks she slept  poorly but does not know detail.   OBJECTIVE:  VITAL SIGNS:  Weight 271 pounds.  BP 112/70.  Pulse regular 58.  Room air saturation 95%.  GENERAL:  This is an overweight, alert, appropriate man.  SKIN:  Clear.  ADENOPATHY:  None at the neck or axillae.  HEENT:  Nasal airway is clear.  Speech quality is normal.  Palate length  3/4.  Normal mandible.  No stridor, thyromegaly, or neck vein distention.  I  do not hear carotid bruits.  CHEST:  Quiet, clear lung fields.  Unlabored breathing.  HEART:  Heart sounds regular without murmur or gallop.  EXTREMITIES:  Without tremor, restlessness, or peripheral edema.   IMPRESSION:  1. Nonrestorative sleep, most likely due to obstructive sleep apnea.  2. Significant atherosclerotic disease with history of cerebrovascular      accident and coronary bypass graft.  3. Hypertension.  4. Gout.   PLAN:  1. We are scheduling a split protocol nocturnal polysomnogram at the [facility].  2. He will return for followup after the sleep study is completed.  I      appreciate the chance to meet him.                                   Clinton D. Maple Hudson, MD, FCCP, FACP   CDY/MedQ  DD:  11/24/2005  DT:  11/25/2005  Job #:  [id number]   cc:   Barbette Hair. Artist Pais DO  [facility]

## 2010-08-02 NOTE — Assessment & Plan Note (Signed)
[facility]                               PULMONARY OFFICE NOTE   NAME:Fiebelkorn, BURLEY KOPKA                      MRN:          [id number]  DATE:12/10/2005                            DOB:          04/10/1945    PROBLEMS:  1. Obstructive sleep apnea.  2. Atherosclerosis/cerebrovascular accident/coronary bypass.  3. Hypertension.  4. Gout.   HISTORY:  He returns after his sleep study done November 26, 2005 at the  [facility].  This confirmed moderate obstructive apnea with an  AHI of 18.1 per hour, moderate snoring and desaturation as low as 58%.  CPAP  was titrated to 15 CWP for an AHI of 0 per hour and restoration of normal  oxygenation.  We reviewed his history of a septoplasty by Dr. Narda Bonds a  few months ago and we discussed the medical concerns and available  treatments for obstructive sleep apnea.   MEDICATIONS:  1. Aspirin 81 mg.  2. Allopurinol 200 mg x2.  3. Plavix 75 mg.  4. Toprol XL 25 mg.  5. Lipitor 20 mg.  6. Protonix 40 mg.  7. Nasacort.  8. Lyrica 150 mg t.i.d.  9. Zyrtec 10 mg.  10.Nitroglycerin.  11.Furosemide.   ALLERGIES:  NO MEDICATION ALLERGY.   OBJECTIVE:  Weight 270 pounds, BP 122/70, pulse regular 56, room air  saturation 95%.  He is alert and cooperative.  He asked perceptive questions  and seems comfortable.  Pulse is felt regular.  I heard no murmur.  Breathing was unlabored.   IMPRESSION:  Moderate obstructive sleep apnea with significant  atherosclerotic vascular disease and hypertension as complicating factors.   PLAN:  1. Home CPAP trial at 15 CWP.  2. Ambien 5 mg #20, one or two at h.s. p.r.n. until he adapts to CPAP.  3. Scheduled to return in one month.  4. The importance of weight loss and responsibility to drive safely were      reviewed.  5. Scheduled return in one month, earlier p.r.n.       Clinton D. Maple Hudson, MD, FCCP, FACP      CDY/MedQ  DD:  12/10/2005  DT:   12/12/2005  Job #:  [id number]   cc:   Barbette Hair. Artist Pais, DO

## 2010-08-02 NOTE — H&P (Signed)
[name], [name]               ACCOUNT NO.:  [id number]   MEDICAL RECORD NO.:  [id number]          PATIENT TYPE:  INP   LOCATION:  2922                         FACILITY:  [facility]   PHYSICIAN:  Hudson Bing, M.D. [facility] OF BIRTH:  1945-12-20   DATE OF ADMISSION:  11/09/2004  DATE OF DISCHARGE:                                HISTORY & PHYSICAL   REFERRING:  Dr. Ethelda Chick   PRIMARY CARE PHYSICIAN:  Dr. Artist Pais   PRIMARY CARDIOLOGIST:  Dr. Myrtis Ser   HISTORY OF PRESENT ILLNESS:  [age]-old gentleman with known  coronary disease presenting with chest discomfort. Mr. Flott history  dates to 2004 when he underwent CABG surgery in [place], [place]. He  presented with predominately dyspnea. He was told he had a small myocardial  infarction in the past, but it sounds as if LV function was preserved. He  did well initially, but returned with chest discomfort in August 2005 and  required stenting of a saphenous vein graft. He returned with chest pain in  November 2005 and underwent stenting of the circumflex. He has a diagram of  his anatomy at that time. There is total occlusion of the mid right coronary  with a patent graft to the LAD and probably an LV extension branch. He had a  70% LAD lesion with a patent LIMA graft to the LAD. There is total occlusion  of a diagonal with a patent graft to that vessel and there was total  occlusion of a first marginal with a patent saphenous vein graft to that  vessel. He had a 70% circumflex lesion with no revascularization of the  distal circumflex and secondary marginals prompting stenting of the  circumflex.  He has a history of hypertension and dyslipidemia that have  been well controlled. There has been no diabetes or use of tobacco products.   The patient moved here from [place] approximately 3 weeks ago. With all the  activity, he has noted some chest discomfort intermittently. This has  usually been associated with exertion. It  has been fairly mild and vague and  relieved by rest. Today, he has had the same discomfort all day. There has  been no dyspnea nor diaphoresis. He took a nap and was distressed when  discomfort was still present upon awakening prompting him to come to the  emergency department by automobile. His symptoms have not changed here, but  he has received no treatment.   PAST MEDICAL HISTORY:  Past medical history is most notable for a right  cerebral CVA in 1996 which sounds like a hemorrhage, possibly from an  aneurysm. He has also had gout presenting in his elbow and has undergone  excision of a ganglion from the right wrist. Otherwise, he has been  generally healthy.   SOCIAL HISTORY:  Lives in [place] with his wife. Has four adult  children who are alive and well. The patient is disabled and does not use  tobacco nor alcohol.   FAMILY HISTORY:  Mother had diabetes; father had rheumatic heart disease; no  prominent history for atherosclerotic disease.  REVIEW OF SYSTEMS:  Notable for rare headaches, intermittent edema in the  left leg, neurologic symptoms including a gait disturbance, decreased  coordination on the left, numbness on the left and some speech disturbance.  He has had GERD symptoms that responded to acid suppression therapy. All  other systems are negative.   EXAMINATION:  GENERAL: On exam, pleasant gentleman in no acute distress. The  temperature is 98.6, heart rate 54, respirations 12, blood pressure 105/80,  O2 saturation 96%.  HEENT: Anicteric sclerae; pupils equal, round, react to light; EOMs full.  Normal oral mucosa.  NECK: No jugular venous distension; normal carotid upstrokes without bruits.  ENDOCRINE: No thyromegaly.  HEMATOPOIETIC: No lymphadenopathy.  CARDIAC: Normal first and second heart sounds; normal PMI; fourth heart  sound present.  LUNGS: Minimal bibasilar rales.  SKIN: No significant lesions.  ABDOMEN: Soft and nontender; no bruits; no  organomegaly; no masses.  EXTREMITIES: Normal distal pulses; trace edema on the left; no clubbing.  MUSCULOSKELETAL: No joint deformities.  NEUROMUSCULAR: Alert and oriented; slight dysphasia with some uncertainty in  his speech and some hesitation. Normal strength and tone. Normal cranial  nerves.   EKG:  Sinus bradycardia at a rate of 56; left axis; nondiagnostic lateral  Q's; otherwise normal. Other lab studies are pending.   IMPRESSION:  [name] has a complex history of coronary disease with the  requirement for both vein graft and native coronary intervention within 2  years of CABG surgery. Nonetheless, all of his grafts have been patent. This  would be an unusual time for a graft occlusion, but he could have  progression of native disease or restenosis in one or the other of his  stents. Since he has required revascularization on each of the three times  that he presented, his symptoms certainly need to be taken seriously. He  will be admitted to rule out myocardial infarction. Initial treatment would  be with intravenous nitroglycerin and low-molecular-weight heparin. Based  upon his course over the next 2 days, I am inclined to proceed with coronary  angiography on Monday. The patient understands the risks and benefits and  agrees to proceed if necessary.   Hypertension is well-controlled. Hyperlipidemia will be assessed. His other  usual medications will be continued.      [place] Bing, M.D. [facility]  Electronically Signed     RR/MEDQ  D:  11/09/2004  T:  11/09/2004  Job:  [id number]

## 2010-08-02 NOTE — H&P (Signed)
[name], [name] NO.:  [id number]   MEDICAL RECORD NO.:  [id number]          PATIENT TYPE:   LOCATION:                                 FACILITY:   PHYSICIAN:  [name], P.A. [facility] DATE OF BIRTH:  05/14/1975   DATE OF ADMISSION:  08/18/2005  DATE OF DISCHARGE:                                HISTORY & PHYSICAL   SUMMARY OF HISTORY:  [name] is a 65 year old white male who presented  to [facility] Emergency Room complaining of left arm hot and cold flashes,  numbness which radiates into his left chest, and feels like a constant  pressure.  This has been present constantly for the last 24 hours. He cannot  think the alleviating or aggravating factors.  He does not have any  associated shortness of breath, nausea, vomiting, or diaphoresis.  He also  describes a constant lightheadedness that has been present for at least 1  year. This morning his lightheadedness seem to be worse.  Thus, he decided  to come to the emergency room to get checked out.  He feels his left arm and  chest symptoms described above is similar to his a October 21, 1998 admission;  however they are different from his 2004 and November 2005 admissions in [place] when he had a bypass surgery and TAXUS stent placed in the circumflex.  He has recently been placed on a diet and exercise regimen by Dr. Artist [name], and  he denies any exertional limitations, recent accidents or injuries.  He does  state that he has been doing a lot of yard work such as the shoveling  stones.   PAST MEDICAL HISTORY:  No known drug allergies.   MEDICATIONS:  Include:  1.  Lyrica 100 mg three times a day.  2.  Lipitor 20 mg nightly.  3.  Zyrtec 10 mg daily.  4.  Fluticasone propionate nasal spray 2 squirts once a day.  5.  Metformin 500 mg twice daily  (prescribed for weight loss)  6.  Allopurinol 200 mg daily.  7.  Plavix 75 mg daily.  8.  Protonix 40 mg daily.  9.  Aspirin 81 daily.  10. Metoprolol 25 daily.  11. Lasix 40 mg every other day.  12. Sublingual nitroglycerin.   PAST MEDICAL HISTORY:  Is notable for:  1.  Right CVA in 1996 with, according to records, may be related to an      aneurysmal hemorrhage with residual left-sided weakness and numbness.  2.  History of hypertension.  3.  Hyperlipidemia.  4.  Gout  5.  Known coronary artery disease with remote MI,  five-vessel bypass      surgery in 2004 in [place], TAXUS stenting to the circumflex in      November 2005, also in [place].  Last catheterization November 11, 2004,      showed EF of 55%, patent LIMA to the LAD, patent saphenous vein graft to      the OM-1 and diagonal. He had a 50-60% ostial saphenous vein graft to  the OM-3 at a prior stent site, and a  total saphenous vein graft to the      RCA which was old and a 50% RCA.  Last adenosine Myoview, which was      performed after the cardiac catheterization on November 20, 2004, showed      EF of 69% possibly per day for scar which reveal peri-infarct ischemia a      surgical history is also notable, possible inferobasilar scar which      revealed peri-infarct ischemia.  6.  Surgical history is also notable for a right ganglion cyst removal.  7.  Possible obstructive sleep apnea.  He is scheduled for a sleep study on      September 16, 2005.   SOCIAL HISTORY:  He resides in [place] with his wife.  He has four  grown children.  He is disabled.  He denies any tobacco, alcohol or drug  history. He just started a low-fat, low-carbohydrate diet.  He has been  bicycling and doing yard work supervised by Dr. Artist [name].   FAMILY HISTORY:  Mother died at the age of 52 secondary to a respiratory  illness, history of diabetes.  Father died age 73 with lung cancer, history  of rheumatic heart disease.  He has two sisters and one brother who are  essentially in good health.   REVIEW OF SYSTEMS:  Is notable for 8-pound weight loss in the last 2 weeks,  chronic sinus problems which have  been worse recently, glasses, occasional  constipation in addition to above. His lightheadedness, he states, is worse  with sinus problems and going from sitting to standing position, and  gradually it resolves.   PHYSICAL EXAMINATION:  GENERAL:  Well-nourished, well-developed, pleasant,  obese white male in no apparent distress.  VITAL SIGNS: Temperature is 97.8, blood pressure is 101/61, pulse 62,  respirations 20.  Orthostatic blood pressure performed in the ER did not  show any acute changes, 96% saturation on room air.  HEENT: Is unremarkable.  NECK: Supple without thyromegaly, adenopathy, JVD or carotid bruits.  HEART:  PMI is not displaced.  Distant heart sounds. Regular rate and  rhythm.  Do not appreciate any murmurs, rubs, clicks or gallops.  All pulses  are symmetrical and intact without femoral or abdominal bruits.  LUNGS: Clear to auscultation.  SKIN:  Integument was intact.  ABDOMEN:  Obese.  Bowel sounds present without organomegaly, masses or  tenderness.  EXTREMITIES: No cyanosis, clubbing or edema.  MUSCULOSKELETAL:  Unremarkable.  However, he does have limited range of  motion of his bilateral shoulders, left worse than the right. Using upper  extremities to push against a force or pull against a force worsens his  symptoms.  NEUROLOGIC: Intact.   Chest x-ray shows borderline cardiomegaly.   EKG shows sinus bradycardia, left axis deviation, nonspecific ST-T wave  changes, early R, normal intervals.  No change from August 2006.   Hemoglobin is 14.6, hematocrit 42.9, normal indices, platelets 136, WBC 6.6.  Sodium 138, potassium 3.9, BUN 19, creatinine 1.9, glucose 85.   IMPRESSION:  Prolonged atypical chest discomfort, possibly musculoskeletal  in origin given his physical exam, recent sinus problems with worsening  lightheadedness.  No evidence of orthostatic blood pressure or pulse changes  in the emergency room, history as noted above.  DISPOSITION:  Dr.  Daleen [name] reviewed the patient's history, spoke with and  examined the patient. We will admit him to [facility] overnight for  observation to  rule out myocardial infarction.  If enzymes and EKGs remain  negative, he will be discharged home and arranged an outpatient Myoview and  followup with Dr. Glennon Hamilton. If  enzymes return as positive, consideration for cardiac catheterization will  be pursued. In regards to his lightheadedness, we will recheck orthostatics  in the morning.  Possible musculoskeletal or neck injury, refer him back to  Dr. Artist [name] for further evaluation and possibly Dr. Sandria Manly for further neurologic  evaluation.      [name], P.A. [facility]     EW/MEDQ  D:  08/18/2005  T:  08/18/2005  Job:  [id number]   cc:   Thomos Lemons, D.O. [facility]  [address] [place], [place] [address]   E. Graceann Congress, M.D.  [address]  [address]  [place]  [place] [address]

## 2010-08-02 NOTE — Consult Note (Signed)
[name], [name]               ACCOUNT NO.:  [id number]   MEDICAL RECORD NO.:  [id number]          PATIENT TYPE:  INP   LOCATION:  6524                         FACILITY:  [facility]   PHYSICIAN:  Michael L. Reynolds, M.D.DATE OF BIRTH:  Mar 25, 1945   DATE OF CONSULTATION:  08/19/2005  DATE OF DISCHARGE:                                   CONSULTATION   REQUESTING PHYSICIAN:  Dr. Juanito Doom   REASON FOR EVALUATION:  Dizziness and congestion.   HISTORY OF PRESENT ILLNESS:  This is an inpatient consultation evaluation of  this existing [facility] patient, a 65 year old man seen  on a couple of occasions in our office by Dr. Sandria Manly and Darrol Angel, N.P.  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  He was  referred to our office in January of this year by Dr. Artist Pais and saw Dr. Sandria Manly  for a sensation of lightheadedness.  Dr. Sandria Manly has gradually transitioned him  from Neurontin to Lyrica which he says has helped him feel less tired,  although he is not sure that the Lyrica is doing as much for his pain.  He  denied much of a change in his chronic sensation of lightheadedness with  that.  The patient was admitted to the hospital yesterday basically because  he has felt badly in the last few days.  He notes over the last few days a  sensation of congestion in his head and a bit of a pressure with  occasional pulsating quality behind his left eye.  He also thinks that his  lightheaded sensation is somewhat worse.  He specifically denies any  vertiginous sensations.  He has also had increased pain in his left arm and  also radiating into his chest.  It was ultimately this that caused him to be  admitted yesterday because he does have history of coronary artery disease.  His cardiac enzymes were negative  but he remains concerned about his other  symptoms and subsequently neurologic consultation is requested.   PAST MEDICAL HISTORY:  Remarkable for a stroke as above.  He also has a  known history of coronary artery disease with bypass grafting done in 2004  in [place].  He has known hypertension, hyperlipidemia, as well as gout.  He is suspected as having sleep apnea, is signed up for a sleep study on  September 16, 2005.   FAMILY HISTORY:  As outlined in admission H&P of August 18, 2005.   SOCIAL HISTORY:  As outlined in admission H&P of August 18, 2005.  Particularly  of note is that he moved to this area from [place] less than a year ago and  states that he has never lived next to a [place] field before.   REVIEW OF SYSTEMS:  As outlined in admission H&P of August 18, 2005.   MEDICATIONS:  1.  Lyrica 100 mg t.i.d.  2.  Lipitor.  3.  Zyrtec.  4.  Nasonex spray.  5.  Metformin.  6.  Allopurinol.  7.  Baby aspirin.  8.  Plavix.  9.  Protonix.  10. Metoprolol.  11. Lasix.  12. He continues on the same medications in the hospital except substituting      Claritin for Zyrtec.   PHYSICAL EXAMINATION:  VITAL SIGNS:  Temperature 97.4, blood pressure  105/60, pulse 48, respirations 15, O2 saturation 96% on room air.  GENERAL:  This is a healthy-appearing man in no evident distress.  HEENT:  Head:  Cranium is normocephalic, atraumatic.  Oropharynx benign.  NECK:  Supple without carotid or supraclavicular bruits.  HEART:  Regular rate and rhythm without murmurs.  NEUROLOGIC:  Mental status:  He is awake, alert, and fully oriented to time,  place, and person.  Recent and remote memory are intact.  He is able to name  objects and repeat phrases without difficulty.  Mood is euthymic.  Affect is  a little bit anxious.  Cranial nerves:  Funduscopic examination is benign.  Pupils are equal and briskly reactive.  Extraocular movements full without  nystagmus.  Visual fields are full to confrontation.   Facial sensation is  diminished to pin prick on the left compared to the right.  He has mild left  facial weakness.  Tongue and palate move normally and symmetrically.  Motor:  Normal bulk.  Increased tone on the left.  He has a minimal left hemiparesis  involving the left upper and lower extremity about equally.  Sensation:  Diminished pin prick and light touch sensation in the left upper and lower  extremities compared to the right.  Coordination:  Rapid movements are  performed slowly on the left.  He has had little bit of finger-nose ataxia  on the left.  Gait:  He arises from a chair easily and his stance is normal.  He ambulates with a mild left hemispastic gait.  Reflexes brisk on the left.  Toes up on the left, down on the right.   LABORATORY REVIEW:  CBC from yesterday:  White count 6.6, hemoglobin 14.6,  platelets 136,000.  CMET from yesterday normal.  TSH from today normal.  Cardiac enzymes negative.  Hemoglobin A1c normal at 5.7.   IMPRESSION:  1.  Subjective dizziness and congestion unlikely to have a neurologic      source.  This most likely represents sinus disease.  2.  History of right brain stroke in 1996 with resultant mild left      hemiparesis and central pain syndrome which is less well controlled on      Lyrica than it was previously on Neurontin.  3.  Fatigue possibly due to medications, possibly due to baseline borderline      low blood pressure and heart rate, possibly obstructive sleep apnea      playing a role as well.   RECOMMENDATIONS:  Will check CT of the head to rule out sinus disease or an  interval neurologic event.  I would suggest increasing his Lyrica 150 mg  q.8h.  He is free to follow up with his primary neurologist, Dr. Sandria Manly, in  about four weeks in the office.  I have reassured him that I do not believe  that he has had any new neurologic situation.  If his CT head does demonstrate sinus infection he will need antibiotics, but if not, would   recommend continuing with the congestion symptoms and following up with his  primary physician.  Michael L. Thad Ranger, M.D.  Electronically Signed    MLR/MEDQ  D:  08/19/2005  T:  08/20/2005  Job:  [id number]   cc:   Thomos Lemons, D.O. [facility]  [address] [place], [place] [address]

## 2010-08-13 ENCOUNTER — Telehealth: Payer: Self-pay | Admitting: Internal Medicine

## 2010-08-13 NOTE — Telephone Encounter (Signed)
Refill-plavix 75mg  tablet. Take 1 tablet by mouth once a day. Qty 90. Last fill 3.9.12

## 2010-08-14 MED ORDER — CLOPIDOGREL BISULFATE 75 MG PO TABS: 75.0000 mg | ORAL_TABLET | Freq: Every day | ORAL | Status: DC

## 2010-08-20 ENCOUNTER — Ambulatory Visit (INDEPENDENT_AMBULATORY_CARE_PROVIDER_SITE_OTHER): Payer: Medicare Other | Admitting: Family

## 2010-08-20 ENCOUNTER — Encounter: Payer: Self-pay | Admitting: Family

## 2010-08-20 ENCOUNTER — Encounter: Payer: Self-pay | Admitting: Internal Medicine

## 2010-08-20 ENCOUNTER — Ambulatory Visit: Payer: Medicare Other | Admitting: Internal Medicine

## 2010-08-20 DIAGNOSIS — R7309 Other abnormal glucose: Secondary | ICD-10-CM

## 2010-08-20 DIAGNOSIS — E785 Hyperlipidemia, unspecified: Secondary | ICD-10-CM

## 2010-08-20 DIAGNOSIS — I10 Essential (primary) hypertension: Secondary | ICD-10-CM

## 2010-08-20 DIAGNOSIS — E119 Type 2 diabetes mellitus without complications: Secondary | ICD-10-CM

## 2010-08-20 DIAGNOSIS — E039 Hypothyroidism, unspecified: Secondary | ICD-10-CM

## 2010-08-20 LAB — BASIC METABOLIC PANEL
Calcium: 9.5 mg/dL (ref 8.4–10.5)
Creat: 1.1 mg/dL (ref 0.50–1.35)
Glucose, Bld: 108 mg/dL — ABNORMAL HIGH (ref 70–99)
Sodium: 137 mEq/L (ref 135–145)

## 2010-08-20 LAB — HEMOGLOBIN A1C
Hgb A1c MFr Bld: 6.1 % — ABNORMAL HIGH (ref <5.7)
Mean Plasma Glucose: 128 mg/dL — ABNORMAL HIGH (ref <117)

## 2010-08-20 LAB — TSH: TSH: 4.108 u[IU]/mL (ref 0.350–4.500)

## 2010-08-20 LAB — HEPATIC FUNCTION PANEL
Albumin: 4.6 g/dL (ref 3.5–5.2)
Alkaline Phosphatase: 49 U/L (ref 39–117)
Total Bilirubin: 0.5 mg/dL (ref 0.3–1.2)

## 2010-08-20 LAB — LIPID PANEL: Cholesterol: 116 mg/dL (ref 0–200)

## 2010-08-20 NOTE — Progress Notes (Signed)
Subjective:    Patient ID: [name], male    DOB: [birth year], 65 y.o.   MRN: [id number]  HPI  HTN- reports + med compliance.  DM2- He does not check his sugars at home.  Denies polyuria/polydipsia. Trying to eat fewer sugars in his diet.  Dizziness- has been seen by Dr. Sandria Manly- not improving, has seen ENT as well.  Narda Bonds)  Cataracts- had surgery in March,notes significant improvement in his vision.      Review of Systems Denies chest pain, swelling, shortness of breath.  Past Medical History  Diagnosis Date  . GERD (gastroesophageal reflux disease)   . Gout   . Hyperlipidemia   . Hypertension   . CVA (cerebral infarction)     left hemiparesis  . Chronic pain     left sided-Kristeins  . DM type 2 (diabetes mellitus, type 2)     broderline  . CAD (coronary artery disease)     s/p CABG, s/p DES to LCX  January 2011  . OSA on CPAP   . Sleep apnea     History   Social History  . Marital Status: Married    Spouse Name: [name]    Number of Children: N/A  . Years of Education: N/A   Occupational History  . retired Web designer   Social History Main Topics  . Smoking status: Never Smoker   . Smokeless tobacco: Not on file  . Alcohol Use: No  . Drug Use: No  . Sexually Active: Not on file   Other Topics Concern  . Not on file   Social History Narrative  . No narrative on file    Past Surgical History  Procedure Date  . Coronary artery bypass graft     stent  . Esophagogastroduodenoscopy 03-25-2005  . Nasal septum surgery   . Percutaneous placement intravascular stent cervical carotid artery     03-2009; using a drug-eluting platform of the circumflex cornoray artery with a 3.0 x 18 [employer] Promus drug-eluting platform post dilated to 3.75 with a noncompliant balloon.  . Cataract extraction 05/2010    left eye  . Cataract extraction 04/2010    right eye    Family History  Problem Relation Age of Onset  . Lung cancer  Father     deceased  . Stroke Mother     deceased-MINISTROKES    No Known Allergies  Current Outpatient Prescriptions on File Prior to Visit  Medication Sig Dispense Refill  . allopurinol (ZYLOPRIM) 100 MG tablet Take 200 mg by mouth daily.        Marland Kitchen aspirin 81 MG tablet Take 81 mg by mouth daily.        . benazepril (LOTENSIN) 5 MG tablet Take 1 tablet (5 mg total) by mouth daily.  90 tablet  0  . clopidogrel (PLAVIX) 75 MG tablet Take 1 tablet (75 mg total) by mouth daily.  30 tablet  0  . furosemide (LASIX) 40 MG tablet Take as directed as needed       . levothyroxine (SYNTHROID, LEVOTHROID) 100 MCG tablet Take 1 tablet (100 mcg total) by mouth daily.  30 tablet  0  . metoprolol succinate (TOPROL-XL) 25 MG 24 hr tablet Take 1 tablet (25 mg total) by mouth daily.  90 tablet  0  . nitroGLYCERIN (NITROSTAT) 0.4 MG SL tablet Place 0.4 mg under the tongue every 5 (five) minutes as needed. Up to 3 doses       .  pantoprazole (PROTONIX) 40 MG tablet Take 40 mg by mouth daily.        . rosuvastatin (CRESTOR) 40 MG tablet Take 40 mg by mouth daily.        Marland Kitchen tiZANidine (ZANAFLEX) 2 MG tablet Take 1 by mouth at bedtime.       . traMADol (ULTRAM) 50 MG tablet Take 50 mg by mouth 2 (two) times daily.        . traMADol (ULTRAM-ER) 100 MG 24 hr tablet Take 100 mg by mouth every morning.          BP 132/80  Pulse 54  Temp(Src) 97.8 F (36.6 C) (Oral)  Resp 16  Ht 6\' 3"  (1.905 m)  Wt 263 lb 0.6 oz (119.314 kg)  BMI 32.88 kg/m2       Objective:   Physical Exam  Constitutional: He appears well-developed and well-nourished.  Cardiovascular: Normal rate and regular rhythm.   Pulmonary/Chest: Effort normal and breath sounds normal.  Abdominal: Soft. Bowel sounds are normal.  Psychiatric: He has a normal mood and affect. His behavior is normal. Judgment and thought content normal.          Assessment & Plan:

## 2010-08-20 NOTE — Patient Instructions (Signed)
Please follow up in 3 months. Complete your labs on the first floor this AM.

## 2010-08-21 ENCOUNTER — Telehealth: Payer: Self-pay | Admitting: *Deleted

## 2010-08-21 DIAGNOSIS — E039 Hypothyroidism, unspecified: Secondary | ICD-10-CM

## 2010-08-21 MED ORDER — LEVOTHYROXINE SODIUM 112 MCG PO TABS: 112.0000 ug | ORAL_TABLET | Freq: Every day | ORAL | Status: DC

## 2010-08-21 NOTE — Telephone Encounter (Signed)
Left message on machine for pt to return my call on pt's cell#.

## 2010-08-21 NOTE — Telephone Encounter (Signed)
Thyroid medication needs to be increased slightly.  I have sent increased dose to pharmacy.  Please arrange follow up TSH in 1 month, if his level is stable then we will send a 90 day supply to his pharmacy.

## 2010-08-21 NOTE — Telephone Encounter (Signed)
Pt requests that all medications be filled for a 90 day supply as it is more cost effective for the pt. Advised pt I would make note in his record. Pt needs refill of Levothyroxine . Please advise is ok to refill current dose.

## 2010-08-22 ENCOUNTER — Telehealth: Payer: Self-pay | Admitting: Internal Medicine

## 2010-08-22 NOTE — Assessment & Plan Note (Addendum)
A1c is 6.1- at goal, discussed diabetic diet.

## 2010-08-22 NOTE — Telephone Encounter (Signed)
re

## 2010-08-22 NOTE — Telephone Encounter (Signed)
Patient called back.  [name] was out of the office.  Explained to patient he needs to pick up the 30 day rx for his increased thyroid medicine.  At the end of the 30 days come back for a TSH and then if the level is ok [name] will send a 90 day to the pharmacy for him

## 2010-08-22 NOTE — Assessment & Plan Note (Signed)
Will increase synthroid from to 112 mcg to treat to goal TSH of 1-2. Pt to f/u in 1 month for TSH.

## 2010-08-22 NOTE — Assessment & Plan Note (Addendum)
BP Readings from Last 3 Encounters:  08/20/10 132/80  06/07/10 138/72  02/28/10 124/72   BP is at goal, continue BB and ACE.

## 2010-08-22 NOTE — Assessment & Plan Note (Signed)
Lipids are at goal.  

## 2010-08-23 NOTE — Telephone Encounter (Signed)
Left message with pt's wife to have pt return my call. 

## 2010-08-23 NOTE — Telephone Encounter (Signed)
[name] 08/22/2010 8:49 AM Signed  Patient called back. [name] was out of the office. Explained to patient he needs to pick up the 30 day rx for his increased thyroid medicine. At the end of the 30 days come back for a TSH and then if the level is ok Efraim Kaufmann will send a 90 day to the pharmacy for him

## 2010-08-23 NOTE — Telephone Encounter (Signed)
See 08/21/10 phone note. 

## 2010-08-26 ENCOUNTER — Encounter: Payer: Self-pay | Admitting: Internal Medicine

## 2010-08-27 ENCOUNTER — Telehealth: Payer: Self-pay | Admitting: Internal Medicine

## 2010-08-27 NOTE — Telephone Encounter (Signed)
ALLOPURINOL 100 MG TABLET QTY 60 TAKE 2 BY MOUTH DAILY LAST FILL 06-05-2010

## 2010-08-28 NOTE — Telephone Encounter (Signed)
Rx refill sent to pharmacy. 

## 2010-08-31 ENCOUNTER — Other Ambulatory Visit: Payer: Self-pay | Admitting: Family

## 2010-09-12 NOTE — Telephone Encounter (Signed)
Lab order entered for TSH the first week of July and forwarded to the lab.

## 2010-09-19 ENCOUNTER — Telehealth: Payer: Self-pay | Admitting: Internal Medicine

## 2010-09-19 DIAGNOSIS — I10 Essential (primary) hypertension: Secondary | ICD-10-CM

## 2010-09-19 NOTE — Telephone Encounter (Signed)
He came to see Melissa and she wanted his thyroid checked and changed his meds.  Every one of his meds requires authorization for each refill.  He would like his meds written for 90 days at a time with 1 refill so that he can just call the drug store and get his meds.  He is having problem between the drugstore calling us and [place] not responding or something.  The Levothoyroxin 112 mcg.  He only has 3 pills left.  Does Melissa want him to go to the lab before refilling this med.  She said she wanted the lab work done prior to refill to make sure he is on the right does.

## 2010-09-20 MED ORDER — METOPROLOL SUCCINATE ER 25 MG PO TB24: 25.0000 mg | ORAL_TABLET | Freq: Every day | ORAL | Status: DC

## 2010-09-20 MED ORDER — LEVOTHYROXINE SODIUM 112 MCG PO TABS: 112.0000 ug | ORAL_TABLET | Freq: Every day | ORAL | Status: DC

## 2010-09-20 MED ORDER — CLOPIDOGREL BISULFATE 75 MG PO TABS: 75.0000 mg | ORAL_TABLET | Freq: Every day | ORAL | Status: DC

## 2010-09-20 NOTE — Telephone Encounter (Signed)
Rx refills for Plavix and Metoprolol, and levothyroxine  sent to pharmacy.Patient was informed at office visit with his wife , he was due for blood work to have his Tsh checked prior to authorizing a 90 day supply to pharmacy. He verbalized understanding of the need to have his thyroid checked.

## 2010-09-21 ENCOUNTER — Telehealth: Payer: Self-pay | Admitting: Family

## 2010-09-21 MED ORDER — LEVOTHYROXINE SODIUM 112 MCG PO TABS: 112.0000 ug | ORAL_TABLET | Freq: Every day | ORAL | Status: DC

## 2010-09-21 NOTE — Telephone Encounter (Signed)
Please call patient and let him know that his thyroid level looks good.  He should continue the 112 mcg of synthroid and f/u in September as scheduled.  I have sent refills to his pharmacy.

## 2010-09-24 ENCOUNTER — Other Ambulatory Visit: Payer: Self-pay | Admitting: Internal Medicine

## 2010-09-27 ENCOUNTER — Telehealth: Payer: Self-pay | Admitting: *Deleted

## 2010-09-27 DIAGNOSIS — I10 Essential (primary) hypertension: Secondary | ICD-10-CM

## 2010-09-27 MED ORDER — FUROSEMIDE 40 MG PO TABS: 40.0000 mg | ORAL_TABLET | Freq: Every day | ORAL | Status: DC

## 2010-09-27 MED ORDER — PANTOPRAZOLE SODIUM 40 MG PO TBEC: 40.0000 mg | DELAYED_RELEASE_TABLET | Freq: Every day | ORAL | Status: DC

## 2010-09-27 MED ORDER — BENAZEPRIL HCL 5 MG PO TABS: 5.0000 mg | ORAL_TABLET | Freq: Every day | ORAL | Status: DC

## 2010-09-27 MED ORDER — ROSUVASTATIN CALCIUM 40 MG PO TABS: 40.0000 mg | ORAL_TABLET | Freq: Every day | ORAL | Status: DC

## 2010-09-27 MED ORDER — ALLOPURINOL 100 MG PO TABS: ORAL_TABLET | ORAL | Status: DC

## 2010-09-27 NOTE — Telephone Encounter (Signed)
Refills completed, pt notified.

## 2010-09-27 NOTE — Telephone Encounter (Signed)
Ok to send 90 day supply with 1 refill

## 2010-09-27 NOTE — Telephone Encounter (Signed)
Pt called stating that he wants 90 day RX's for the following meds:  Crestor 40 mg qd, 90x2 on 02/11/10  Benazapril 5 mg 90x0 on 06/14/10  Pantoprazole 40 mg 90x1 on 12/10/09  Furosemide 40 mg, no refill info in system, Dr. Artist Pais prescriber per pharmacy.  Allopurinol 100 mg, 180x1 on 11/23/09  Pt was last seen on 08/20/10 and had labs at that time.  Pt has follow up on 11/26/10.  Is it OK to fill these meds for 90 days?  How many additional refills?

## 2010-10-01 ENCOUNTER — Ambulatory Visit: Payer: Medicare Other | Admitting: Physical Medicine & Rehabilitation

## 2010-10-01 ENCOUNTER — Encounter: Payer: Medicare Other | Attending: Neurosurgery | Admitting: Neurosurgery

## 2010-10-01 DIAGNOSIS — G8110 Spastic hemiplegia affecting unspecified side: Secondary | ICD-10-CM

## 2010-10-01 DIAGNOSIS — I69959 Hemiplegia and hemiparesis following unspecified cerebrovascular disease affecting unspecified side: Secondary | ICD-10-CM | POA: Insufficient documentation

## 2010-10-01 DIAGNOSIS — M62838 Other muscle spasm: Secondary | ICD-10-CM | POA: Insufficient documentation

## 2010-10-01 DIAGNOSIS — R29898 Other symptoms and signs involving the musculoskeletal system: Secondary | ICD-10-CM | POA: Insufficient documentation

## 2010-10-02 NOTE — Assessment & Plan Note (Signed)
This is a patient of Dr. Wynn Banker who is known for left upper extremity spasticity as well as left lower extremity weakness and spasticity.  The patient had a CVA causing the left hemiplegia, spastic.  He has been getting Botox injections with Dr. Wynn Banker and he comes in today just for office followup.  He is also seeing Dr. Sandria Manly.  He does not rate his pain today.  He just states it is constant.  Sensation is diminished in the upper extremity as well.  General activity level is 3-5.  Pain is worse in the morning.  Sleep patterns are fair.  Medication injection tends to help.  He does not indicate really what worsens his condition. Mobility, he is independent.  He climb steps and drives.  He is not employed.  REVIEW OF SYSTEMS:  Notable for those difficulties above as well as some trouble with ambulation.  No suicidal thoughts or aberrant behaviors.  PAST MEDICAL HISTORY:  Unchanged.  SOCIAL HISTORY:  He is married, lives with his wife.  FAMILY HISTORY:  Unchanged.  Physical exam; his blood pressure is 136/57, his pulse is 61, respirations 18, O2 sats 95 on room air.  He is weak in the left upper and lower extremity.  Coordination is intact but not exact with his motions.  Constitutionally, he is within normal limits.  He is alert and oriented x3.  He does walk with a limp.  IMPRESSION:  Chronic left spastic hemiplegia, upper and lower.  PLAN:  He will continue his home exercise program working on the spasticity itself.  He will follow up here with Dr. Wynn Banker in a month for possible Botox and he will continue his current medication regimen with tramadol, Zanaflex and Ultram.  He already has refills on those. He will call when he needs refills.  His questions were encouraged and answered     Shanica Castellanos L. Blima Dessert Electronically Signed    RLW/MedQ D:  10/01/2010 11:23:19  T:  10/02/2010 01:08:26  Job #:  [id number]

## 2010-10-03 ENCOUNTER — Telehealth: Payer: Self-pay | Admitting: Internal Medicine

## 2010-10-03 MED ORDER — ALLOPURINOL 100 MG PO TABS: ORAL_TABLET | ORAL | Status: DC

## 2010-10-03 NOTE — Telephone Encounter (Signed)
Gave verbal to Trinna Post to cancel 09/27/10 rx for #60 and dispense #180 x 1 refill.

## 2010-10-03 NOTE — Telephone Encounter (Signed)
Allopurinol refill sent to pharmacy on 09-27-10 with a 60 day supply.   Per pharmacy, patient is requesting 90 day supply instead of 60 day.

## 2010-10-11 ENCOUNTER — Encounter: Payer: Self-pay | Admitting: Internal Medicine

## 2010-10-11 ENCOUNTER — Telehealth: Payer: Self-pay | Admitting: Internal Medicine

## 2010-10-11 NOTE — Telephone Encounter (Signed)
Patient states that he wants a shingles shot. He said that he called his insurance co.([employer]) and they told him that the shot would be covered 100% with zero copay. He would like Dr. Rodena Medin to order him the shot.

## 2010-10-11 NOTE — Telephone Encounter (Signed)
Call placed to patient for clarification on Shingles vaccine at [phone], no answer, no voicemail.

## 2010-10-14 NOTE — Telephone Encounter (Signed)
Call placed to patient at [phone], no answer, no voice mail

## 2010-10-15 NOTE — Telephone Encounter (Signed)
Call placed to patient at [phone], he was asked about shingles injection. He stated that he was informed by his insurance company that they will cover the vaccine at 100% if given in the office. He was asked when he would like to schedule for the vaccine, and he was he could have vaccine given at his next office visit scheduled for 11/26/2010. Patient is an agreement with getting the vaccine administered at his next office visit.

## 2010-10-16 ENCOUNTER — Ambulatory Visit: Payer: Medicare Other | Admitting: Internal Medicine

## 2010-10-17 ENCOUNTER — Encounter: Payer: Self-pay | Admitting: Internal Medicine

## 2010-10-17 ENCOUNTER — Ambulatory Visit (INDEPENDENT_AMBULATORY_CARE_PROVIDER_SITE_OTHER): Payer: Medicare Other | Admitting: Internal Medicine

## 2010-10-17 VITALS — BP 139/78 | HR 54 | Resp 14 | Ht 75.0 in | Wt 255.0 lb

## 2010-10-17 DIAGNOSIS — I2510 Atherosclerotic heart disease of native coronary artery without angina pectoris: Secondary | ICD-10-CM

## 2010-10-17 DIAGNOSIS — I10 Essential (primary) hypertension: Secondary | ICD-10-CM

## 2010-10-17 NOTE — Progress Notes (Signed)
HPI:  [name]  is a 66 year old male with a history of coronary artery disease status post bypass surgery in 2004 and Taxus drug-eluting stent to the circumflex in 2005.  He had a heart catheterization in January 2011 showing a patent LIMA to the LAD, with chronic total occlusion of SVG - OM, and interval occlusion of SVG - RCA. Flow down native RCA ok. Underwent PCI with DES of native LCX. EF was normal.  LV function normal. Remainder of his medical history is notable for chronic chest pain, hypertension, hyperlipidemia, previous stroke with chronic pain and left-sided weakness and numbness, diabetes, obstructive sleep apnea on CPAP.  We last saw him about 6 months ago was struggling with dizzines and a feeling of dyscoordination and slowing down.  Had a brain MRI which was performed in October of 2011 and revealed  no acute intracranial abnormality; moderate to severe chronic ischemic disease, predominately small vessel.  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 He was also saw ENT and treated for sinusitis without any improvement. We then referred to him back to Dr. Sandria Manly. He was referred to rehab. Still dizzy but some better.  There is no associated chest pain, dyspnea or edema. No problems with medications. BP typically 120/70s.   ROS: All systems negative except as listed in HPI, PMH and Problem List.  Past Medical History  Diagnosis Date  . GERD (gastroesophageal reflux disease)   . Gout   . Hyperlipidemia   . Hypertension   . CVA (cerebral infarction)     left hemiparesis  . Chronic pain     left sided-Kristeins  . DM type 2 (diabetes mellitus, type 2)     broderline  . CAD (coronary artery disease)     s/p CABG, s/p DES to LCX  January 2011  . OSA on CPAP   . Sleep apnea   . History of colonoscopy     Current Outpatient Prescriptions  Medication Sig Dispense Refill  . allopurinol (ZYLOPRIM) 100 MG tablet Take 2 tablets by mouth once a  day.  180 tablet  1  . aspirin 81 MG tablet Take 81 mg by mouth daily.        . benazepril (LOTENSIN) 5 MG tablet Take 1 tablet (5 mg total) by mouth daily.  90 tablet  1  . clopidogrel (PLAVIX) 75 MG tablet Take 1 tablet (75 mg total) by mouth daily.  90 tablet  1  . furosemide (LASIX) 40 MG tablet Take 1 tablet (40 mg total) by mouth daily. Take as directed as needed  90 tablet  1  . levothyroxine (SYNTHROID, LEVOTHROID) 112 MCG tablet Take 1 tablet (112 mcg total) by mouth daily.  30 tablet  3  . metoprolol succinate (TOPROL-XL) 25 MG 24 hr tablet Take 1 tablet (25 mg total) by mouth daily.  90 tablet  1  . nitroGLYCERIN (NITROSTAT) 0.4 MG SL tablet Place 0.4 mg under the tongue every 5 (five) minutes as needed. Up to 3 doses       . NON FORMULARY 2 L/M for sleep       . pantoprazole (PROTONIX) 40 MG tablet Take 1 tablet (40 mg total) by mouth daily.  90 tablet  1  . rosuvastatin (CRESTOR) 40 MG tablet Take 1 tablet (40 mg total) by mouth daily.  90 tablet  1  . tiZANidine (ZANAFLEX) 2 MG tablet Take 1 by mouth at bedtime.       Marland Kitchen  traMADol (ULTRAM) 50 MG tablet Take 50 mg by mouth 2 (two) times daily.        . traMADol (ULTRAM-ER) 100 MG 24 hr tablet Take 100 mg by mouth every morning.           PHYSICAL EXAM: Filed Vitals:   10/17/10 1343  BP: 139/78  Pulse: 54  Resp: 14   General:  No acute distress. walks with a limp. no resp difficulty HEENT: normal Neck: supple. no JVD. Carotids 2+ bilat; no bruits. No lymphadenopathy or thryomegaly appreciated. Cor: PMI nondisplaced. Regular rate & rhythm. No rubs, gallops, murmur. Lungs: clear Abdomen: soft, nontender, nondistended. No hepatosplenomegaly. No bruits or masses. Good bowel sounds. Extremities: no cyanosis, clubbing, rash, edema Neuro: alert & orientedx3, cranial nerves grossly intact.     ECG: Sinus brady 54. Mild LVH. No ST-T wave abnormalities.     ASSESSMENT & PLAN:

## 2010-10-17 NOTE — Assessment & Plan Note (Signed)
No evidence of ischemia. Continue current regimen.   

## 2010-10-17 NOTE — Assessment & Plan Note (Signed)
BP elevated slightly today but well controlled at home. Continue current regimen.

## 2010-10-31 ENCOUNTER — Other Ambulatory Visit: Payer: Self-pay | Admitting: Family

## 2010-10-31 NOTE — Telephone Encounter (Signed)
Rx refill sent to pharmacy. 

## 2010-11-01 ENCOUNTER — Ambulatory Visit: Payer: Medicare Other | Admitting: Physical Medicine & Rehabilitation

## 2010-11-01 ENCOUNTER — Encounter: Payer: Medicare Other | Attending: Physical Medicine & Rehabilitation

## 2010-11-01 DIAGNOSIS — R42 Dizziness and giddiness: Secondary | ICD-10-CM | POA: Insufficient documentation

## 2010-11-01 DIAGNOSIS — I69959 Hemiplegia and hemiparesis following unspecified cerebrovascular disease affecting unspecified side: Secondary | ICD-10-CM | POA: Insufficient documentation

## 2010-11-01 DIAGNOSIS — G8110 Spastic hemiplegia affecting unspecified side: Secondary | ICD-10-CM

## 2010-11-01 DIAGNOSIS — R209 Unspecified disturbances of skin sensation: Secondary | ICD-10-CM | POA: Insufficient documentation

## 2010-11-01 NOTE — Procedures (Signed)
NAMEEDWORD, CU               ACCOUNT NO.:  [id number]  MEDICAL RECORD NO.:  [id number]           PATIENT TYPE:  O  LOCATION:  [facility]                          FACILITY:  [facility]  PHYSICIAN:  [name], M.D.DATE OF BIRTH:  09-21-1945  DATE OF PROCEDURE: DATE OF DISCHARGE:                              OPERATIVE REPORT  This is a botulinum toxin injection left upper and left lower extremity under needle EMG guidance.  Muscles injected include left FCR 25 units x2, left FDS 25 units x2, left pronator teres 50 units x1, left hamstrings medial 25 units x3, lateral 25 units x3.  Dilution 50 units/mL.  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  REMS form complete.  Informed consent form complete.  After informed consent was obtained.  The patient was marked and prepped with Betadine and alcohol entered with the 50-mm, 26-gauge needle electrode under needle EMG guidance.  After appropriate EMG activity was obtained, the muscles listed above were injected after negative drawback for blood.  The patient tolerated the procedure well.  Postprocedure instructions given.  Total units injection 300 units.  I will see the patient back in 3 months for repeat.     [name], M.D. Electronically Signed    AEK/MEDQ  D:  11/01/2010 17:03:09  T:  11/01/2010 21:27:04  Job:  [id number]

## 2010-11-25 ENCOUNTER — Telehealth: Payer: Self-pay | Admitting: Internal Medicine

## 2010-11-25 NOTE — Telephone Encounter (Signed)
Refill furosemide 40 mg tablet qty 45 take 1 by mouth daily last fill 08-31-2010

## 2010-11-25 NOTE — Telephone Encounter (Signed)
Patient is scheduled for 11/26/2010 with Dr Rodena Medin medication refill at office visit.

## 2010-11-26 ENCOUNTER — Telehealth: Payer: Self-pay | Admitting: Internal Medicine

## 2010-11-26 ENCOUNTER — Ambulatory Visit (INDEPENDENT_AMBULATORY_CARE_PROVIDER_SITE_OTHER): Payer: Medicare Other | Admitting: Internal Medicine

## 2010-11-26 ENCOUNTER — Encounter: Payer: Self-pay | Admitting: Internal Medicine

## 2010-11-26 ENCOUNTER — Ambulatory Visit: Payer: Medicare Other | Admitting: Internal Medicine

## 2010-11-26 DIAGNOSIS — Z23 Encounter for immunization: Secondary | ICD-10-CM

## 2010-11-26 DIAGNOSIS — E039 Hypothyroidism, unspecified: Secondary | ICD-10-CM

## 2010-11-26 DIAGNOSIS — Z2911 Encounter for prophylactic immunotherapy for respiratory syncytial virus (RSV): Secondary | ICD-10-CM

## 2010-11-26 DIAGNOSIS — H9209 Otalgia, unspecified ear: Secondary | ICD-10-CM | POA: Insufficient documentation

## 2010-11-26 DIAGNOSIS — R7309 Other abnormal glucose: Secondary | ICD-10-CM

## 2010-11-26 DIAGNOSIS — E785 Hyperlipidemia, unspecified: Secondary | ICD-10-CM

## 2010-11-26 MED ORDER — METFORMIN HCL 500 MG PO TABS: 500.0000 mg | ORAL_TABLET | Freq: Every day | ORAL | Status: DC

## 2010-11-26 MED ORDER — NEOMYCIN-POLYMYXIN-HC 3.5-10000-1 OT SOLN: 3.0000 [drp] | Freq: Three times a day (TID) | OTIC | Status: AC

## 2010-11-26 NOTE — Telephone Encounter (Signed)
Please schedule cbc, chem7, a1c, urine microalbumin 250.0, tsh, free t4 hypothyroidism and lipid/lft 272.4 prior to next visit

## 2010-11-26 NOTE — Progress Notes (Signed)
Addended by: Glendell Docker on: 11/26/2010 10:02 AM   Modules accepted: Orders

## 2010-11-26 NOTE — Assessment & Plan Note (Signed)
Attempt cortisporin otic to right ear. Followup if no improvement or worsening.

## 2010-11-26 NOTE — Assessment & Plan Note (Signed)
Stable. Obtain tsh/free t4 prior to next visit

## 2010-11-26 NOTE — Assessment & Plan Note (Signed)
Resume low dose metformin. Obtain cbc, chem7, a1c prior to next visit

## 2010-11-26 NOTE — Progress Notes (Signed)
  Subjective:    Patient ID: [name], male    DOB: 01/18/1946, 65 y.o.   MRN: [id number]  HPI Pt presents to clinic for followup of multiple medical problems. 3 wk h/o right ear irritation deep within. No discharge, injury, or fever. Attempted unspecified drops to right ear without improvement. No exacerbating or alleviating factors. H/o DM well controlled but a1c increasing. Felt better on metformin with noted help with appetite. Requests to resume metformin. Requests zostavax. No other complaints.  Past Medical History  Diagnosis Date  . GERD (gastroesophageal reflux disease)   . Gout   . Hyperlipidemia   . Hypertension   . CVA (cerebral infarction)     left hemiparesis  . Chronic pain     left sided-Kristeins  . DM type 2 (diabetes mellitus, type 2)     broderline  . CAD (coronary artery disease)     s/p CABG, s/p DES to LCX  January 2011  . OSA on CPAP   . Sleep apnea   . History of colonoscopy    Past Surgical History  Procedure Date  . Coronary artery bypass graft     stent  . Esophagogastroduodenoscopy 03-25-2005  . Nasal septum surgery   . Percutaneous placement intravascular stent cervical carotid artery     03-2009; using a drug-eluting platform of the circumflex cornoray artery with a 3.0 x 18 [employer] Promus drug-eluting platform post dilated to 3.75 with a noncompliant balloon.  . Cataract extraction 05/2010    left eye  . Cataract extraction 04/2010    right eye    reports that he has never smoked. He has never used smokeless tobacco. He reports that he does not drink alcohol or use illicit drugs. family history includes Lung cancer in his father and Stroke in his mother. No Known Allergies   Review of Systems see hpi     Objective:   Physical Exam  Physical Exam  Nursing note and vitals reviewed. Constitutional: Appears well-developed and well-nourished. No distress.  HENT: op clear. Head: Normocephalic and atraumatic.  Right Ear: External  ear normal. Canal slightly erythematous. TM nl Left Ear: External ear normal. TM and canal nl. Eyes: Conjunctivae are normal. No scleral icterus.  Neck: Neck supple. Carotid bruit is not present.  Cardiovascular: Normal rate, regular rhythm and normal heart sounds.  Exam reveals no gallop and no friction rub.   No murmur heard. Pulmonary/Chest: Effort normal and breath sounds normal. No respiratory distress. He has no wheezes. no rales.  Lymphadenopathy:    He has no cervical adenopathy.  Neurological:Alert.  Skin: Skin is warm and dry. Not diaphoretic.  Psychiatric: Has a normal mood and affect.         Assessment & Plan:

## 2010-11-26 NOTE — Telephone Encounter (Signed)
PLEASE SEND A LAB ORDER FOR 1-7 FOR BLOOD WORK WEEK PRIOR TO 1-14 FOLLOW UP APPT

## 2010-11-26 NOTE — Patient Instructions (Signed)
Please schedule cbc, chem7, a1c, urine microalbumin 250.0, tsh, free t4 hypothyroidism and lipid/lft 272.4 prior to next visit   

## 2010-11-27 NOTE — Telephone Encounter (Signed)
Lab orders have been entered for the [facility] for the first week of January 2013

## 2010-12-13 ENCOUNTER — Ambulatory Visit: Payer: Medicare Other | Admitting: Physical Medicine & Rehabilitation

## 2011-01-23 ENCOUNTER — Encounter: Payer: Self-pay | Admitting: Internal Medicine

## 2011-01-23 ENCOUNTER — Ambulatory Visit (INDEPENDENT_AMBULATORY_CARE_PROVIDER_SITE_OTHER): Payer: Medicare Other | Admitting: Internal Medicine

## 2011-01-23 VITALS — BP 142/84 | HR 58 | Ht 75.0 in | Wt 261.6 lb

## 2011-01-23 DIAGNOSIS — G4733 Obstructive sleep apnea (adult) (pediatric): Secondary | ICD-10-CM

## 2011-01-23 NOTE — Patient Instructions (Addendum)
Continue O2 for sleep- please call as needed  Overnight oximetry on room air - dx OSA.

## 2011-01-23 NOTE — Progress Notes (Signed)
Subjective:    Patient ID: [name], male    DOB: [birth year], 65 y.o.   MRN: [id number]  HPI 06/07/10-65 yoM followed for obstructive sleep apnea off CPAP and on O2 at 2L/M at night. He feels fine with that. He is not being told that he snores or stops breathing much in sleep. He is seeing Dr Sandria Manly Neurology for complaints of unsteady, lightheaded, vertigo. Has had cataract surgery since last here.   01/23/11- 65 yoM followed for obstructive sleep apnea off CPAP and on O2 at 2L/M at night, complicated by hx CVA, CAD Had flu vax. Finished building house and now working on his [employer]- despite his medical problems. Says he sleeps fine with O2 at 2L. Denies needs or new problems. Breathing comfortable. Dogs woke him last night- blames this for yawning today.   Review of Systems- see HPI Constitutional:   No-   weight loss, night sweats, fevers, chills, fatigue, lassitude. HEENT:   No-  headaches, difficulty swallowing, tooth/dental problems, sore throat,       No-  sneezing, itching, ear ache, nasal congestion, post nasal drip,  CV:  No-   chest pain, orthopnea, PND, swelling in lower extremities, anasarca, dizziness, palpitations Resp: No-   shortness of breath with exertion or at rest.              No-   productive cough,  No non-productive cough,  No- coughing up of blood.              No-   change in color of mucus.  No- wheezing.   Skin: No-   rash or lesions. GI:  No-   heartburn, indigestion, abdominal pain, nausea, vomiting, diarrhea,                 change in bowel habits, loss of appetite GU: No-   dysuria, change in color of urine, no urgency or frequency.  No- flank pain. MS:  No-   joint pain or swelling.  No- decreased range of motion.  No- back pain. Neuro-     nothing unusual Psych:  No- change in mood or affect. No depression or anxiety.  No memory loss.   Objective:   Physical Exam General- Alert, Oriented, Affect-appropriate, Distress- none acute Skin- rash-none,  lesions- none, excoriation- none Lymphadenopathy- none Head- atraumatic            Eyes- Gross vision intact, PERRLA, conjunctivae clear secretions            Ears- Hearing, canals-normal            Nose- Clear, no-Septal dev, mucus, polyps, erosion, perforation             Throat- Mallampati II-III , mucosa clear , drainage- none, tonsils- atrophic Neck- flexible , trachea midline, no stridor , thyroid nl, carotid no bruit Chest - symmetrical excursion , unlabored           Heart/CV- RRR , no murmur , no gallop  , no rub, nl s1 s2                           - JVD- none , edema- none, stasis changes- none, varices- none           Lung- clear to P&A, wheeze- none, cough- none , dullness-none, rub- none           Chest wall-  Abd- tender-no, distended-no, bowel sounds-present, HSM-  no Br/ Gen/ Rectal- Not done, not indicated Extrem- cyanosis- none, clubbing, none, atrophy- none, strength- nl Neuro- left hemiparesis, left palate doesn't lift as well.

## 2011-01-23 NOTE — Assessment & Plan Note (Addendum)
He had failed to tolerate CPAP, but has felt very comfortable and sleeping well with O2 at night. Now needs documentation to recertify for home O2. We will order ONOX on room air.

## 2011-01-27 ENCOUNTER — Encounter: Payer: Medicare Other | Attending: Physical Medicine & Rehabilitation

## 2011-01-27 ENCOUNTER — Ambulatory Visit: Payer: Medicare Other | Admitting: Physical Medicine & Rehabilitation

## 2011-01-27 DIAGNOSIS — G8110 Spastic hemiplegia affecting unspecified side: Secondary | ICD-10-CM

## 2011-01-27 DIAGNOSIS — I69959 Hemiplegia and hemiparesis following unspecified cerebrovascular disease affecting unspecified side: Secondary | ICD-10-CM | POA: Insufficient documentation

## 2011-01-27 DIAGNOSIS — R209 Unspecified disturbances of skin sensation: Secondary | ICD-10-CM | POA: Insufficient documentation

## 2011-01-27 DIAGNOSIS — R42 Dizziness and giddiness: Secondary | ICD-10-CM | POA: Insufficient documentation

## 2011-01-27 NOTE — Procedures (Signed)
NAMEGRACE, [name]               ACCOUNT NO.:  [id number]  MEDICAL RECORD NO.:  [id number]           PATIENT TYPE:  O  LOCATION:  [facility]                          FACILITY:  [facility]  PHYSICIAN:  [name], M.D.DATE OF BIRTH:  02-02-1946  DATE OF PROCEDURE: DATE OF DISCHARGE:                              OPERATIVE REPORT  This is a Botox injection under needle EMG guidance.  INDICATION:  Left spastic hemiplegia, last injection done 3 months ago. Informed consent was obtained after describing risks and benefits of the procedure with the patient.  These include bleeding, bruising, infection, REMS form is on file.  Muscles injected today under needle EMG guidance include the left FCR 25 units x2, left FDS 25 units x2, left pronator teres 50 units x1, left medial hamstrings 25 units x3, left lateral hamstrings 25 units x3.  DILUTION:  50 units/ml.  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  After informed consent was obtained, the patient was marked and prepped with Betadine and alcohol, entered with a 50 mm 26-gauge needle electrode under needle EMG guidance.  After appropriate EMG activity obtained the muscle lists above were injected after negative drawback for blood.  The patient tolerated procedure well.  Postprocedure instructions given.  Total injection was 300 units.  See the patient back in 3 months for probable repeat at that time given that it has been his history with prior injections.     [name], M.D. Electronically Signed    AEK/MEDQ  D:  01/27/2011 15:41:02  T:  01/27/2011 20:16:20  Job:  [id number]

## 2011-01-28 ENCOUNTER — Ambulatory Visit: Payer: Medicare Other | Admitting: Physical Medicine & Rehabilitation

## 2011-02-14 ENCOUNTER — Other Ambulatory Visit: Payer: Self-pay | Admitting: Family

## 2011-02-15 ENCOUNTER — Other Ambulatory Visit: Payer: Self-pay | Admitting: Family

## 2011-02-20 ENCOUNTER — Encounter: Payer: Self-pay | Admitting: Internal Medicine

## 2011-03-19 ENCOUNTER — Telehealth: Payer: Self-pay | Admitting: Internal Medicine

## 2011-03-19 MED ORDER — CLOPIDOGREL BISULFATE 75 MG PO TABS: 75.0000 mg | ORAL_TABLET | Freq: Every day | ORAL | Status: DC

## 2011-03-19 NOTE — Telephone Encounter (Signed)
Refill sent to pharmacy #90 x 1 refill.

## 2011-03-19 NOTE — Telephone Encounter (Signed)
Refill- clopiddgrel 75mg  tablet. Take one tablet (75mg  total) by mouth daily. Qty 90 last fill 10.1.12

## 2011-03-25 ENCOUNTER — Telehealth: Payer: Self-pay | Admitting: Internal Medicine

## 2011-03-25 DIAGNOSIS — I10 Essential (primary) hypertension: Secondary | ICD-10-CM

## 2011-03-25 MED ORDER — METOPROLOL SUCCINATE ER 25 MG PO TB24: 25.0000 mg | ORAL_TABLET | Freq: Every day | ORAL | Status: DC

## 2011-03-25 NOTE — Telephone Encounter (Signed)
Rx refill sent to pharmacy. 

## 2011-03-31 ENCOUNTER — Encounter: Payer: Self-pay | Admitting: Internal Medicine

## 2011-03-31 ENCOUNTER — Telehealth: Payer: Self-pay | Admitting: Internal Medicine

## 2011-03-31 ENCOUNTER — Ambulatory Visit (INDEPENDENT_AMBULATORY_CARE_PROVIDER_SITE_OTHER): Payer: Medicare Other | Admitting: Internal Medicine

## 2011-03-31 DIAGNOSIS — R5381 Other malaise: Secondary | ICD-10-CM

## 2011-03-31 DIAGNOSIS — R5383 Other fatigue: Secondary | ICD-10-CM | POA: Insufficient documentation

## 2011-03-31 DIAGNOSIS — R7309 Other abnormal glucose: Secondary | ICD-10-CM

## 2011-03-31 DIAGNOSIS — I10 Essential (primary) hypertension: Secondary | ICD-10-CM

## 2011-03-31 DIAGNOSIS — E039 Hypothyroidism, unspecified: Secondary | ICD-10-CM | POA: Diagnosis not present

## 2011-03-31 DIAGNOSIS — E785 Hyperlipidemia, unspecified: Secondary | ICD-10-CM

## 2011-03-31 DIAGNOSIS — Z125 Encounter for screening for malignant neoplasm of prostate: Secondary | ICD-10-CM | POA: Diagnosis not present

## 2011-03-31 DIAGNOSIS — E119 Type 2 diabetes mellitus without complications: Secondary | ICD-10-CM

## 2011-03-31 NOTE — Assessment & Plan Note (Signed)
Maintained on statin tx. Obtain lipid/lft  

## 2011-03-31 NOTE — Assessment & Plan Note (Signed)
Obtain tsh/ft4 

## 2011-03-31 NOTE — Telephone Encounter (Signed)
Lab orders entered for May 2013 

## 2011-03-31 NOTE — Assessment & Plan Note (Signed)
Obtain testosterone in addition to chem7, cbc and TFT

## 2011-03-31 NOTE — Progress Notes (Signed)
  Subjective:    Patient ID: [name], male    DOB: 12-Oct-1945, 66 y.o.   MRN: [id number]  HPI Pt presents to clinic for followup of multiple medical problems. Has chronic intermittent lightheadedness without fall/syncope. Believes it is related to past stroke. Notes intermittent fatigue without associated sx's. BP reviewed normotensive. Tolerating statin tx. No other complaints.   Past Medical History  Diagnosis Date  . GERD (gastroesophageal reflux disease)   . Gout   . Hyperlipidemia   . Hypertension   . CVA (cerebral infarction)     left hemiparesis  . Chronic pain     left sided-Kristeins  . DM type 2 (diabetes mellitus, type 2)     broderline  . CAD (coronary artery disease)     s/p CABG, s/p DES to LCX  January 2011  . OSA on CPAP   . Sleep apnea   . History of colonoscopy    Past Surgical History  Procedure Date  . Coronary artery bypass graft     stent  . Esophagogastroduodenoscopy 03-25-2005  . Nasal septum surgery   . Percutaneous placement intravascular stent cervical carotid artery     03-2009; using a drug-eluting platform of the circumflex cornoray artery with a 3.0 x 18 [employer] Promus drug-eluting platform post dilated to 3.75 with a noncompliant balloon.  . Cataract extraction 05/2010    left eye  . Cataract extraction 04/2010    right eye    reports that he has never smoked. He has never used smokeless tobacco. He reports that he does not drink alcohol or use illicit drugs. family history includes Lung cancer in his father and Stroke in his mother. No Known Allergies   Review of Systems see hpi     Objective:   Physical Exam  Physical Exam  Nursing note and vitals reviewed. Constitutional: Appears well-developed and well-nourished. No distress.  HENT:  Head: Normocephalic and atraumatic.  Right Ear: External ear normal.  Left Ear: External ear normal.  Eyes: Conjunctivae are normal. No scleral icterus.  Neck: Neck supple. Carotid  bruit is not present.  Cardiovascular: Normal rate, regular rhythm and normal heart sounds.  Exam reveals no gallop and no friction rub.   No murmur heard. Pulmonary/Chest: Effort normal and breath sounds normal. No respiratory distress. He has no wheezes. no rales.  Lymphadenopathy:    He has no cervical adenopathy.  Neurological:Alert.  Skin: Skin is warm and dry. Not diaphoretic.  Psychiatric: Has a normal mood and affect.        Assessment & Plan:

## 2011-03-31 NOTE — Patient Instructions (Signed)
Please schedule chem7, a1c 250.0 prior to next visit 

## 2011-03-31 NOTE — Assessment & Plan Note (Signed)
Normotensive and stable. Continue current regimen. Monitor bp as outpt and followup in clinic as scheduled.  

## 2011-03-31 NOTE — Assessment & Plan Note (Signed)
Obtain cbc, chem7, a1c and urine microalbumin 

## 2011-04-01 ENCOUNTER — Other Ambulatory Visit: Payer: Self-pay | Admitting: Internal Medicine

## 2011-04-01 DIAGNOSIS — E039 Hypothyroidism, unspecified: Secondary | ICD-10-CM | POA: Diagnosis not present

## 2011-04-01 DIAGNOSIS — I10 Essential (primary) hypertension: Secondary | ICD-10-CM | POA: Diagnosis not present

## 2011-04-01 DIAGNOSIS — E7800 Pure hypercholesterolemia, unspecified: Secondary | ICD-10-CM | POA: Diagnosis not present

## 2011-04-01 DIAGNOSIS — E119 Type 2 diabetes mellitus without complications: Secondary | ICD-10-CM | POA: Diagnosis not present

## 2011-04-01 LAB — HEMOGLOBIN A1C: Mean Plasma Glucose: 131 mg/dL — ABNORMAL HIGH (ref <117)

## 2011-04-01 LAB — BASIC METABOLIC PANEL
CO2: 28 mEq/L (ref 19–32)
Calcium: 9.6 mg/dL (ref 8.4–10.5)
Chloride: 105 mEq/L (ref 96–112)
Creat: 1.1 mg/dL (ref 0.50–1.35)
Sodium: 142 mEq/L (ref 135–145)

## 2011-04-01 LAB — LIPID PANEL
Cholesterol: 119 mg/dL (ref 0–200)
HDL: 50 mg/dL (ref >39)
Total CHOL/HDL Ratio: 2.4 Ratio
VLDL: 23 mg/dL (ref 0–40)

## 2011-04-01 LAB — HEPATIC FUNCTION PANEL
AST: 23 U/L (ref 0–37)
Albumin: 4.4 g/dL (ref 3.5–5.2)
Alkaline Phosphatase: 45 U/L (ref 39–117)
Bilirubin, Direct: 0.1 mg/dL (ref 0.0–0.3)
Total Bilirubin: 0.4 mg/dL (ref 0.3–1.2)

## 2011-04-01 LAB — TSH: TSH: 2.006 u[IU]/mL (ref 0.350–4.500)

## 2011-04-01 LAB — TESTOSTERONE: Testosterone: 190.93 ng/dL — ABNORMAL LOW (ref 250–890)

## 2011-04-01 LAB — PSA, MEDICARE: PSA: 0.2 ng/mL (ref <=4.00)

## 2011-04-02 LAB — CBC
Platelets: 165 10*3/uL (ref 150–400)
RDW: 13.8 % (ref 11.5–15.5)
WBC: 5.3 10*3/uL (ref 4.0–10.5)

## 2011-04-02 LAB — MICROALBUMIN / CREATININE URINE RATIO
Creatinine, Urine: 204.1 mg/dL
Microalb Creat Ratio: 2.4 mg/g (ref 0.0–30.0)

## 2011-04-06 ENCOUNTER — Other Ambulatory Visit: Payer: Self-pay | Admitting: Family

## 2011-04-08 ENCOUNTER — Other Ambulatory Visit: Payer: Self-pay | Admitting: Family

## 2011-04-10 ENCOUNTER — Ambulatory Visit (INDEPENDENT_AMBULATORY_CARE_PROVIDER_SITE_OTHER): Payer: Medicare Other | Admitting: Internal Medicine

## 2011-04-10 ENCOUNTER — Telehealth: Payer: Self-pay | Admitting: Internal Medicine

## 2011-04-10 ENCOUNTER — Encounter: Payer: Self-pay | Admitting: Internal Medicine

## 2011-04-10 DIAGNOSIS — E291 Testicular hypofunction: Secondary | ICD-10-CM

## 2011-04-10 DIAGNOSIS — R82998 Other abnormal findings in urine: Secondary | ICD-10-CM

## 2011-04-10 DIAGNOSIS — N429 Disorder of prostate, unspecified: Secondary | ICD-10-CM

## 2011-04-10 HISTORY — DX: Testicular hypofunction: E29.1

## 2011-04-10 MED ORDER — TESTOSTERONE 5 MG/24HR TD PT24: 1.0000 | MEDICATED_PATCH | Freq: Every day | TRANSDERMAL | Status: DC

## 2011-04-10 NOTE — Telephone Encounter (Signed)
Lab order entered for [facility].

## 2011-04-10 NOTE — Assessment & Plan Note (Signed)
New dx. Possibly symptomatic. LFT and PSA nl. Begin androderm patches. Discussed strict avoidance for male exposure. Discussed and recommended psa/lft surveillance.

## 2011-04-10 NOTE — Assessment & Plan Note (Signed)
No s/s of infection or other abnormality. History suggests description of am concentration of urine. Discussed with pt and to monitor for s/s of infection.

## 2011-04-10 NOTE — Patient Instructions (Signed)
Please schedule early morning testosterone and psa (hypogonadism and 602.9)

## 2011-04-10 NOTE — Progress Notes (Signed)
  Subjective:    Patient ID: [name], male    DOB: 02-Oct-1945, 66 y.o.   MRN: [id number]  HPI Pt presents to clinic for evaluation of hypogonadism. Recent lab evaluation for fatigue yielded low testosterone level. psa and lft reviewed nl. Feels that his fatigue sx has been present for over a year. No past h/o hypogonadism. Notes dark urine primarily in the am and lightens in color later in the day. Denies hematuria, malodorous urine, dysuria, frequency or urgency. No other complaints.  Past Medical History  Diagnosis Date  . GERD (gastroesophageal reflux disease)   . Gout   . Hyperlipidemia   . Hypertension   . CVA (cerebral infarction)     left hemiparesis  . Chronic pain     left sided-Kristeins  . DM type 2 (diabetes mellitus, type 2)     broderline  . CAD (coronary artery disease)     s/p CABG, s/p DES to LCX  January 2011  . OSA on CPAP   . Sleep apnea   . History of colonoscopy    Past Surgical History  Procedure Date  . Coronary artery bypass graft     stent  . Esophagogastroduodenoscopy 03-25-2005  . Nasal septum surgery   . Percutaneous placement intravascular stent cervical carotid artery     03-2009; using a drug-eluting platform of the circumflex cornoray artery with a 3.0 x 18 [employer] Promus drug-eluting platform post dilated to 3.75 with a noncompliant balloon.  . Cataract extraction 05/2010    left eye  . Cataract extraction 04/2010    right eye    reports that he has never smoked. He has never used smokeless tobacco. He reports that he does not drink alcohol or use illicit drugs. family history includes Lung cancer in his father and Stroke in his mother. No Known Allergies   Review of Systems see hpi     Objective:   Physical Exam  Nursing note and vitals reviewed. Constitutional: He appears well-developed and well-nourished. No distress.  HENT:  Head: Normocephalic and atraumatic.  Eyes: Conjunctivae are normal. No scleral icterus.    Neurological: He is alert.  Skin: He is not diaphoretic.  Psychiatric: He has a normal mood and affect.          Assessment & Plan:

## 2011-04-11 ENCOUNTER — Telehealth: Payer: Self-pay | Admitting: *Deleted

## 2011-04-11 DIAGNOSIS — E291 Testicular hypofunction: Secondary | ICD-10-CM

## 2011-04-11 NOTE — Telephone Encounter (Signed)
androgel 4 pumps applied daily to shoulder or upper arm

## 2011-04-11 NOTE — Telephone Encounter (Signed)
Faxed received from [facility] stating the Androderm 5 mg patch is long term backorder. They would like to know if an alternative could be prescribed.

## 2011-04-14 ENCOUNTER — Other Ambulatory Visit: Payer: Self-pay | Admitting: Family

## 2011-04-14 MED ORDER — TESTOSTERONE 50 MG/5GM (1%) TD GEL: 5.0000 g | Freq: Every day | TRANSDERMAL | Status: DC

## 2011-04-14 NOTE — Telephone Encounter (Signed)
Verbal order provided to Pharmacist [facility].

## 2011-04-15 ENCOUNTER — Ambulatory Visit: Payer: Medicare Other | Admitting: Internal Medicine

## 2011-04-15 ENCOUNTER — Other Ambulatory Visit: Payer: Self-pay | Admitting: Family

## 2011-04-15 NOTE — Telephone Encounter (Signed)
Benazepril request denied as refill was previously sent on 04/06/11 #90 x 1 refill. Should still have refill on file. Allopurinol #180 x 1 refill sent to pharmacy.

## 2011-04-15 NOTE — Telephone Encounter (Signed)
Verified with Jill Alexanders at [facility] that they received previous refills from 04/06/11 and 04/08/11. Current rxs denied.

## 2011-04-16 ENCOUNTER — Telehealth: Payer: Self-pay | Admitting: Internal Medicine

## 2011-04-16 NOTE — Telephone Encounter (Signed)
Call placed to patient at [phone], he was advised per Dr Rodena Medin instructions and has verbalized understanding. He will attempt 2 pumps for one week.

## 2011-04-16 NOTE — Telephone Encounter (Signed)
Patient states that he took the new medication for testosterone yesterday and it made him very dizzy. He would like to know if this is a side effect to the med?

## 2011-04-16 NOTE — Telephone Encounter (Signed)
Yes. Can start with 2 pumps instead of 4 for a week and see how he feels

## 2011-04-23 ENCOUNTER — Other Ambulatory Visit: Payer: Self-pay | Admitting: Family

## 2011-04-23 NOTE — Telephone Encounter (Signed)
Spoke with [facility]ers at [facility] and verified that they received Benazepril refill of #90 on 04/06/11 and pt has already picked up. Gave verbal for pantoprazole #90 x 1 refill.

## 2011-04-24 DIAGNOSIS — L84 Corns and callosities: Secondary | ICD-10-CM | POA: Diagnosis not present

## 2011-04-24 DIAGNOSIS — I739 Peripheral vascular disease, unspecified: Secondary | ICD-10-CM | POA: Diagnosis not present

## 2011-04-24 DIAGNOSIS — L608 Other nail disorders: Secondary | ICD-10-CM | POA: Diagnosis not present

## 2011-04-29 ENCOUNTER — Ambulatory Visit: Payer: Medicare Other | Admitting: Physical Medicine & Rehabilitation

## 2011-04-29 ENCOUNTER — Encounter: Payer: Medicare Other | Attending: Physical Medicine & Rehabilitation

## 2011-04-29 DIAGNOSIS — R42 Dizziness and giddiness: Secondary | ICD-10-CM | POA: Insufficient documentation

## 2011-04-29 DIAGNOSIS — I69959 Hemiplegia and hemiparesis following unspecified cerebrovascular disease affecting unspecified side: Secondary | ICD-10-CM | POA: Diagnosis not present

## 2011-04-29 DIAGNOSIS — R209 Unspecified disturbances of skin sensation: Secondary | ICD-10-CM | POA: Diagnosis not present

## 2011-04-29 DIAGNOSIS — G8110 Spastic hemiplegia affecting unspecified side: Secondary | ICD-10-CM | POA: Diagnosis not present

## 2011-04-29 NOTE — Procedures (Signed)
NAMEMRK, BUZBY               ACCOUNT NO.:  [id number]  MEDICAL RECORD NO.:  [id number]           PATIENT TYPE:  O  LOCATION:  [facility]                          FACILITY:  [facility]  PHYSICIAN:  [name], M.D.DATE OF BIRTH:  1945-06-16  DATE OF PROCEDURE: DATE OF DISCHARGE:                              OPERATIVE REPORT  PROCEDURE:  Left upper extremity and left lower extremity botulinum toxin injection.  INDICATION:  Left spastic hemiplegia with spasticity, only partially responsive to medication management and other conservative care, interfering with activity.  Informed consent was obtained after describing risks and benefits of the procedure with the patient.  These include bleeding, bruising, and infection as well as the potential side effects of the Botox REMS form on file.  Total units injected was 300 units.  Botox injection done under needle EMG guidance.  Dilution is 50 units/mL.  Muscles injected today under EMG needle guidance include left FCR 25 units x2, left FDS 25 units x2, left pronator teres 50 units x1, left medial hamstrings 25 units x3, left lateral hamstrings 25 units x3.  All injections done after negative drawback for blood.  The patient tolerated the procedure well.  Postprocedure instructions given.  I will see him back in 3 months for probable reinjection at that time given that this has been historically his frequency of injections.     [name], M.D. Electronically Signed    AEK/MEDQ  D:  04/29/2011 09:44:13  T:  04/29/2011 11:03:15  Job:  [id number]

## 2011-05-18 ENCOUNTER — Other Ambulatory Visit: Payer: Self-pay | Admitting: Family

## 2011-05-19 ENCOUNTER — Other Ambulatory Visit: Payer: Self-pay | Admitting: Family

## 2011-05-19 NOTE — Telephone Encounter (Signed)
Pantoprazle was refilled on 04/23/11 #90 x 1 refill; see previous documentation. Current request denied as refills should be on file at pharmacy.

## 2011-06-16 ENCOUNTER — Telehealth: Payer: Self-pay | Admitting: Internal Medicine

## 2011-06-16 MED ORDER — DOXYCYCLINE HYCLATE 100 MG PO TABS: 100.0000 mg | ORAL_TABLET | Freq: Two times a day (BID) | ORAL | Status: DC

## 2011-06-16 NOTE — Telephone Encounter (Signed)
Call placed to patient at [phone], he was informed of medication and denies allergies. He was advised per Dr Rodena Medin if no improvement office visit needed. Patient verbalized understanding and agrees.  No interactions noted, Rx sent to pharmacy.

## 2011-06-16 NOTE — Telephone Encounter (Signed)
Call placed to patient at [phone], no answer. A voice message was left for patient to return phone call regarding more detailed information on his symptoms.

## 2011-06-16 NOTE — Telephone Encounter (Signed)
Call placed to patient at  [phone]; He states he ahs chest congestion, non productive to productive cough yellow in color, temperature not checked-but he states he feels like he has a fever , he has hot and cold sweats, fatigue, chest discomfort. He states his symptoms have been present since Thursday of last week, and he does not feel strong enough to come in for an appointment.

## 2011-06-16 NOTE — Telephone Encounter (Signed)
Patient states that he has been sick since last Thursday. His symptoms are mucous in upper respiratory, weakness, and achy. I offered patient appointment for today but he states that he is too sick to come in. [facility] [address]

## 2011-06-16 NOTE — Telephone Encounter (Signed)
Patient would like for you to call him @ home [phone]

## 2011-06-16 NOTE — Telephone Encounter (Signed)
If that sick appointment would be recommended but he declines. Can call in doxycycline 100mg  bid x 7d if not allergic and no interactions. Would again recommend evaluation if he doesn't get substantially better with abx

## 2011-06-17 ENCOUNTER — Emergency Department (HOSPITAL_COMMUNITY): Payer: Medicare Other

## 2011-06-17 ENCOUNTER — Telehealth: Payer: Self-pay | Admitting: Internal Medicine

## 2011-06-17 ENCOUNTER — Encounter (HOSPITAL_COMMUNITY): Payer: Self-pay | Admitting: Emergency Medicine

## 2011-06-17 ENCOUNTER — Other Ambulatory Visit: Payer: Self-pay

## 2011-06-17 ENCOUNTER — Inpatient Hospital Stay (HOSPITAL_COMMUNITY)
Admission: EM | Admit: 2011-06-17 | Discharge: 2011-06-21 | DRG: 308 | Disposition: A | Discharge status: Home / Self Care | Payer: Medicare Other | Attending: Family Medicine | Admitting: Family Medicine

## 2011-06-17 DIAGNOSIS — I10 Essential (primary) hypertension: Secondary | ICD-10-CM | POA: Diagnosis present

## 2011-06-17 DIAGNOSIS — R0609 Other forms of dyspnea: Secondary | ICD-10-CM | POA: Diagnosis not present

## 2011-06-17 DIAGNOSIS — J111 Influenza due to unidentified influenza virus with other respiratory manifestations: Secondary | ICD-10-CM | POA: Diagnosis present

## 2011-06-17 DIAGNOSIS — E119 Type 2 diabetes mellitus without complications: Secondary | ICD-10-CM | POA: Diagnosis not present

## 2011-06-17 DIAGNOSIS — Z7982 Long term (current) use of aspirin: Secondary | ICD-10-CM

## 2011-06-17 DIAGNOSIS — Z79899 Other long term (current) drug therapy: Secondary | ICD-10-CM | POA: Diagnosis not present

## 2011-06-17 DIAGNOSIS — J9601 Acute respiratory failure with hypoxia: Secondary | ICD-10-CM | POA: Diagnosis present

## 2011-06-17 DIAGNOSIS — R7309 Other abnormal glucose: Secondary | ICD-10-CM

## 2011-06-17 DIAGNOSIS — R002 Palpitations: Secondary | ICD-10-CM | POA: Diagnosis not present

## 2011-06-17 DIAGNOSIS — Z9861 Coronary angioplasty status: Secondary | ICD-10-CM | POA: Diagnosis not present

## 2011-06-17 DIAGNOSIS — R059 Cough, unspecified: Secondary | ICD-10-CM | POA: Diagnosis not present

## 2011-06-17 DIAGNOSIS — E039 Hypothyroidism, unspecified: Secondary | ICD-10-CM | POA: Diagnosis present

## 2011-06-17 DIAGNOSIS — I6359 Cerebral infarction due to unspecified occlusion or stenosis of other cerebral artery: Secondary | ICD-10-CM | POA: Diagnosis not present

## 2011-06-17 DIAGNOSIS — G4733 Obstructive sleep apnea (adult) (pediatric): Secondary | ICD-10-CM | POA: Diagnosis present

## 2011-06-17 DIAGNOSIS — Z951 Presence of aortocoronary bypass graft: Secondary | ICD-10-CM | POA: Diagnosis present

## 2011-06-17 DIAGNOSIS — M109 Gout, unspecified: Secondary | ICD-10-CM | POA: Diagnosis present

## 2011-06-17 DIAGNOSIS — R0989 Other specified symptoms and signs involving the circulatory and respiratory systems: Secondary | ICD-10-CM | POA: Diagnosis not present

## 2011-06-17 DIAGNOSIS — Z823 Family history of stroke: Secondary | ICD-10-CM | POA: Diagnosis not present

## 2011-06-17 DIAGNOSIS — K219 Gastro-esophageal reflux disease without esophagitis: Secondary | ICD-10-CM | POA: Diagnosis present

## 2011-06-17 DIAGNOSIS — I69959 Hemiplegia and hemiparesis following unspecified cerebrovascular disease affecting unspecified side: Secondary | ICD-10-CM | POA: Diagnosis not present

## 2011-06-17 DIAGNOSIS — I6329 Cerebral infarction due to unspecified occlusion or stenosis of other precerebral arteries: Secondary | ICD-10-CM | POA: Diagnosis not present

## 2011-06-17 DIAGNOSIS — I2510 Atherosclerotic heart disease of native coronary artery without angina pectoris: Secondary | ICD-10-CM | POA: Diagnosis not present

## 2011-06-17 DIAGNOSIS — I6930 Unspecified sequelae of cerebral infarction: Secondary | ICD-10-CM | POA: Diagnosis present

## 2011-06-17 DIAGNOSIS — D696 Thrombocytopenia, unspecified: Secondary | ICD-10-CM | POA: Diagnosis present

## 2011-06-17 DIAGNOSIS — I517 Cardiomegaly: Secondary | ICD-10-CM | POA: Diagnosis not present

## 2011-06-17 DIAGNOSIS — G8929 Other chronic pain: Secondary | ICD-10-CM | POA: Diagnosis present

## 2011-06-17 DIAGNOSIS — R0902 Hypoxemia: Secondary | ICD-10-CM | POA: Diagnosis present

## 2011-06-17 DIAGNOSIS — E785 Hyperlipidemia, unspecified: Secondary | ICD-10-CM | POA: Diagnosis present

## 2011-06-17 DIAGNOSIS — J9600 Acute respiratory failure, unspecified whether with hypoxia or hypercapnia: Secondary | ICD-10-CM | POA: Diagnosis present

## 2011-06-17 DIAGNOSIS — I4891 Unspecified atrial fibrillation: Secondary | ICD-10-CM | POA: Diagnosis not present

## 2011-06-17 DIAGNOSIS — R0602 Shortness of breath: Secondary | ICD-10-CM | POA: Diagnosis not present

## 2011-06-17 DIAGNOSIS — E291 Testicular hypofunction: Secondary | ICD-10-CM | POA: Diagnosis present

## 2011-06-17 DIAGNOSIS — R05 Cough: Secondary | ICD-10-CM | POA: Diagnosis not present

## 2011-06-17 DIAGNOSIS — J9819 Other pulmonary collapse: Secondary | ICD-10-CM | POA: Diagnosis not present

## 2011-06-17 LAB — CARDIAC PANEL(CRET KIN+CKTOT+MB+TROPI)
CK, MB: 6.9 ng/mL (ref 0.3–4.0)
Relative Index: 2.5 (ref 0.0–2.5)
Troponin I: <0.3 ng/mL (ref <0.30)

## 2011-06-17 LAB — CBC
Hemoglobin: 14.8 g/dL (ref 13.0–17.0)
MCHC: 33.7 g/dL (ref 30.0–36.0)
Platelets: 120 10*3/uL — ABNORMAL LOW (ref 150–400)

## 2011-06-17 LAB — HEMOGLOBIN A1C
Hgb A1c MFr Bld: 5.9 % — ABNORMAL HIGH (ref <5.7)
Mean Plasma Glucose: 123 mg/dL — ABNORMAL HIGH (ref <117)

## 2011-06-17 LAB — COMPREHENSIVE METABOLIC PANEL
ALT: 29 U/L (ref 0–53)
AST: 35 U/L (ref 0–37)
Albumin: 3.4 g/dL — ABNORMAL LOW (ref 3.5–5.2)
Alkaline Phosphatase: 47 U/L (ref 39–117)
Chloride: 103 mEq/L (ref 96–112)
Potassium: 3.4 mEq/L — ABNORMAL LOW (ref 3.5–5.1)
Sodium: 142 mEq/L (ref 135–145)
Total Bilirubin: 0.5 mg/dL (ref 0.3–1.2)

## 2011-06-17 LAB — PROTIME-INR: INR: 1.06 (ref 0.00–1.49)

## 2011-06-17 LAB — DIFFERENTIAL
Basophils Absolute: 0 10*3/uL (ref 0.0–0.1)
Basophils Relative: 1 % (ref 0–1)
Monocytes Relative: 12 % (ref 3–12)
Neutro Abs: 1.4 10*3/uL — ABNORMAL LOW (ref 1.7–7.7)
Neutrophils Relative %: 46 % (ref 43–77)

## 2011-06-17 LAB — GLUCOSE, CAPILLARY

## 2011-06-17 MED ORDER — ONDANSETRON HCL 4 MG/2ML IJ SOLN: 4.0000 mg | Freq: Three times a day (TID) | INTRAMUSCULAR | Status: DC | PRN

## 2011-06-17 MED ORDER — SODIUM CHLORIDE 0.9 % IV BOLUS (SEPSIS)
500.0000 mL | Freq: Once | INTRAVENOUS | Status: AC
  Administered 2011-06-17: 12:00:00 via INTRAVENOUS

## 2011-06-17 MED ORDER — TIZANIDINE HCL 2 MG PO TABS
2.0000 mg | ORAL_TABLET | Freq: Every day | ORAL | Status: DC
  Administered 2011-06-17 – 2011-06-20 (×4): 2 mg via ORAL
  Filled 2011-06-17 (×5): qty 1

## 2011-06-17 MED ORDER — ASPIRIN 81 MG PO TABS: 81.0000 mg | ORAL_TABLET | Freq: Every day | ORAL | Status: DC

## 2011-06-17 MED ORDER — CLOPIDOGREL BISULFATE 75 MG PO TABS
75.0000 mg | ORAL_TABLET | Freq: Every day | ORAL | Status: DC
  Administered 2011-06-17 – 2011-06-18 (×2): 75 mg via ORAL
  Filled 2011-06-17 (×4): qty 1

## 2011-06-17 MED ORDER — POTASSIUM CHLORIDE CRYS ER 20 MEQ PO TBCR
40.0000 meq | EXTENDED_RELEASE_TABLET | Freq: Two times a day (BID) | ORAL | Status: DC
  Administered 2011-06-18 (×2): 40 meq via ORAL
  Administered 2011-06-19: 11:00:00 via ORAL
  Administered 2011-06-19 – 2011-06-21 (×5): 40 meq via ORAL
  Filled 2011-06-17 (×8): qty 2

## 2011-06-17 MED ORDER — SODIUM CHLORIDE 0.9 % IV SOLN
INTRAVENOUS | Status: DC
  Administered 2011-06-17: 17:00:00 via INTRAVENOUS

## 2011-06-17 MED ORDER — HEPARIN (PORCINE) IN NACL 100-0.45 UNIT/ML-% IJ SOLN
1500.0000 [IU]/h | INTRAMUSCULAR | Status: DC
  Administered 2011-06-17 – 2011-06-18 (×2): 1500 [IU]/h via INTRAVENOUS
  Filled 2011-06-17 (×4): qty 250

## 2011-06-17 MED ORDER — LEVOTHYROXINE SODIUM 112 MCG PO TABS
112.0000 ug | ORAL_TABLET | Freq: Every day | ORAL | Status: DC
  Administered 2011-06-18 – 2011-06-21 (×4): 112 ug via ORAL
  Filled 2011-06-17 (×5): qty 1

## 2011-06-17 MED ORDER — LEVALBUTEROL HCL 0.63 MG/3ML IN NEBU
0.6300 mg | INHALATION_SOLUTION | Freq: Four times a day (QID) | RESPIRATORY_TRACT | Status: DC | PRN
  Administered 2011-06-18: 0.63 mg via RESPIRATORY_TRACT
  Filled 2011-06-17 (×2): qty 3

## 2011-06-17 MED ORDER — ONDANSETRON HCL 4 MG/2ML IJ SOLN: 4.0000 mg | Freq: Four times a day (QID) | INTRAMUSCULAR | Status: DC | PRN

## 2011-06-17 MED ORDER — ROSUVASTATIN CALCIUM 40 MG PO TABS
40.0000 mg | ORAL_TABLET | Freq: Every day | ORAL | Status: DC
  Administered 2011-06-17 – 2011-06-20 (×4): 40 mg via ORAL
  Filled 2011-06-17 (×5): qty 1

## 2011-06-17 MED ORDER — HEPARIN BOLUS VIA INFUSION
4000.0000 [IU] | Freq: Once | INTRAVENOUS | Status: AC
  Administered 2011-06-17: 4000 [IU] via INTRAVENOUS
  Filled 2011-06-17: qty 4000

## 2011-06-17 MED ORDER — ALUM & MAG HYDROXIDE-SIMETH 200-200-20 MG/5ML PO SUSP: 30.0000 mL | Freq: Four times a day (QID) | ORAL | Status: DC | PRN

## 2011-06-17 MED ORDER — PANTOPRAZOLE SODIUM 40 MG PO TBEC
40.0000 mg | DELAYED_RELEASE_TABLET | Freq: Every day | ORAL | Status: DC
  Administered 2011-06-18 – 2011-06-21 (×4): 40 mg via ORAL
  Filled 2011-06-17 (×4): qty 1

## 2011-06-17 MED ORDER — ASPIRIN EC 81 MG PO TBEC
81.0000 mg | DELAYED_RELEASE_TABLET | Freq: Every day | ORAL | Status: DC
  Administered 2011-06-17 – 2011-06-21 (×5): 81 mg via ORAL
  Filled 2011-06-17 (×5): qty 1

## 2011-06-17 MED ORDER — WARFARIN - PHARMACIST DOSING INPATIENT: Freq: Every day | Status: DC

## 2011-06-17 MED ORDER — ENOXAPARIN SODIUM 120 MG/0.8ML ~~LOC~~ SOLN
1.0000 mg/kg | SUBCUTANEOUS | Status: DC
  Filled 2011-06-17: qty 0.8

## 2011-06-17 MED ORDER — BENAZEPRIL HCL 5 MG PO TABS
5.0000 mg | ORAL_TABLET | Freq: Every day | ORAL | Status: DC
  Administered 2011-06-17 – 2011-06-21 (×5): 5 mg via ORAL
  Filled 2011-06-17 (×5): qty 1

## 2011-06-17 MED ORDER — ALBUTEROL SULFATE (5 MG/ML) 0.5% IN NEBU: 2.5000 mg | INHALATION_SOLUTION | Freq: Once | RESPIRATORY_TRACT | Status: DC

## 2011-06-17 MED ORDER — INSULIN ASPART 100 UNIT/ML ~~LOC~~ SOLN: 0.0000 [IU] | Freq: Every day | SUBCUTANEOUS | Status: DC

## 2011-06-17 MED ORDER — ATORVASTATIN CALCIUM 80 MG PO TABS: 80.0000 mg | ORAL_TABLET | Freq: Every day | ORAL | Status: DC

## 2011-06-17 MED ORDER — ALLOPURINOL 100 MG PO TABS
200.0000 mg | ORAL_TABLET | Freq: Every day | ORAL | Status: DC
  Administered 2011-06-17 – 2011-06-21 (×5): 200 mg via ORAL
  Filled 2011-06-17 (×5): qty 2

## 2011-06-17 MED ORDER — LEVOFLOXACIN IN D5W 750 MG/150ML IV SOLN
750.0000 mg | INTRAVENOUS | Status: DC
  Administered 2011-06-17: 750 mg via INTRAVENOUS
  Filled 2011-06-17 (×3): qty 150

## 2011-06-17 MED ORDER — INSULIN ASPART 100 UNIT/ML ~~LOC~~ SOLN
0.0000 [IU] | Freq: Three times a day (TID) | SUBCUTANEOUS | Status: DC
  Administered 2011-06-19 (×2): 1 [IU] via SUBCUTANEOUS

## 2011-06-17 MED ORDER — HYDROMORPHONE HCL PF 1 MG/ML IJ SOLN
0.5000 mg | INTRAMUSCULAR | Status: DC | PRN
Start: 2011-06-17 — End: 2011-06-17

## 2011-06-17 MED ORDER — ONDANSETRON HCL 4 MG PO TABS: 4.0000 mg | ORAL_TABLET | Freq: Four times a day (QID) | ORAL | Status: DC | PRN

## 2011-06-17 MED ORDER — WARFARIN SODIUM 7.5 MG PO TABS
7.5000 mg | ORAL_TABLET | Freq: Once | ORAL | Status: AC
  Administered 2011-06-17: 7.5 mg via ORAL
  Filled 2011-06-17: qty 1

## 2011-06-17 MED ORDER — GUAIFENESIN-DM 100-10 MG/5ML PO SYRP
5.0000 mL | ORAL_SOLUTION | ORAL | Status: DC | PRN
  Administered 2011-06-18: 5 mL via ORAL
  Filled 2011-06-17: qty 5

## 2011-06-17 MED ORDER — DILTIAZEM HCL 100 MG IV SOLR
5.0000 mg/h | INTRAVENOUS | Status: DC
  Administered 2011-06-17: 5 mg/h via INTRAVENOUS
  Administered 2011-06-18 (×2): 15 mg/h via INTRAVENOUS
  Filled 2011-06-17 (×3): qty 100

## 2011-06-17 MED ORDER — SODIUM CHLORIDE 0.9 % IV SOLN
Freq: Once | INTRAVENOUS | Status: AC
  Administered 2011-06-17: 14:00:00 via INTRAVENOUS

## 2011-06-17 MED ORDER — ENOXAPARIN SODIUM 120 MG/0.8ML ~~LOC~~ SOLN: 1.0000 mg/kg | Freq: Two times a day (BID) | SUBCUTANEOUS | Status: DC

## 2011-06-17 MED ORDER — ACETAMINOPHEN 650 MG RE SUPP: 650.0000 mg | Freq: Four times a day (QID) | RECTAL | Status: DC | PRN

## 2011-06-17 MED ORDER — HYDROCODONE-ACETAMINOPHEN 5-325 MG PO TABS
1.0000 | ORAL_TABLET | ORAL | Status: DC | PRN
  Administered 2011-06-18 – 2011-06-19 (×2): 1 via ORAL
  Filled 2011-06-17 (×2): qty 1

## 2011-06-17 MED ORDER — ACETAMINOPHEN 325 MG PO TABS: 650.0000 mg | ORAL_TABLET | Freq: Four times a day (QID) | ORAL | Status: DC | PRN

## 2011-06-17 MED ORDER — MORPHINE SULFATE 4 MG/ML IJ SOLN
4.0000 mg | Freq: Once | INTRAMUSCULAR | Status: AC
  Administered 2011-06-17: 4 mg via INTRAVENOUS
  Filled 2011-06-17: qty 1

## 2011-06-17 MED ORDER — WARFARIN VIDEO
Freq: Once | Status: AC
  Administered 2011-06-18: 16:00:00

## 2011-06-17 MED ORDER — HYDROMORPHONE HCL PF 1 MG/ML IJ SOLN
1.0000 mg | INTRAMUSCULAR | Status: DC | PRN
  Administered 2011-06-18: 1 mg via INTRAVENOUS
  Filled 2011-06-17: qty 1

## 2011-06-17 MED ORDER — DEXTROSE 5 % IV SOLN: 5.0000 mg/h | INTRAVENOUS | Status: DC

## 2011-06-17 MED ORDER — PATIENT'S GUIDE TO USING COUMADIN BOOK
Freq: Once | Status: DC
  Filled 2011-06-17 (×2): qty 1

## 2011-06-17 MED ORDER — SODIUM CHLORIDE 0.9 % IJ SOLN
3.0000 mL | Freq: Two times a day (BID) | INTRAMUSCULAR | Status: DC
  Administered 2011-06-17 – 2011-06-20 (×4): 3 mL via INTRAVENOUS

## 2011-06-17 MED ORDER — DILTIAZEM HCL 50 MG/10ML IV SOLN
10.0000 mg | Freq: Once | INTRAVENOUS | Status: AC
  Administered 2011-06-17: 10 mg via INTRAVENOUS
  Filled 2011-06-17: qty 2

## 2011-06-17 MED ORDER — IOHEXOL 350 MG/ML SOLN
100.0000 mL | Freq: Once | INTRAVENOUS | Status: AC | PRN
  Administered 2011-06-17: 100 mL via INTRAVENOUS

## 2011-06-17 MED ORDER — TRAMADOL HCL 50 MG PO TABS
50.0000 mg | ORAL_TABLET | Freq: Two times a day (BID) | ORAL | Status: DC
  Administered 2011-06-17 – 2011-06-21 (×8): 50 mg via ORAL
  Filled 2011-06-17 (×9): qty 1

## 2011-06-17 NOTE — ED Provider Notes (Signed)
Medical screening examination/treatment/procedure(s) were conducted as a shared visit with non-physician practitioner(s) and myself.  I personally evaluated the patient during the encounter  Toy Baker, MD 06/17/11 [phone]

## 2011-06-17 NOTE — Telephone Encounter (Signed)
Patients wife [name] wanted to inform Dr. Rodena Medin that patient has been admitted to [facility] for pneumonia.

## 2011-06-17 NOTE — Progress Notes (Signed)
Patient ID: [name], male   DOB: Jun 02, 1945, 66 y.o.   MRN: [id number]   Patient ID: [name] MRN: [id number], DOB/AGE: 04/13/1945   Admit date: 06/17/2011   Primary Physician: [name], [name], MD, MD Primary Cardiologist: Bensimhon  Pt. Profile:  [name] is a 66 year old white male who comes in with new onset AFib and dyspnea.  Problem List  Past Medical History  Diagnosis Date  . GERD (gastroesophageal reflux disease)   . Gout   . Hyperlipidemia   . Hypertension   . CVA (cerebral infarction)     left hemiparesis  . Chronic pain     left sided-Kristeins  . DM type 2 (diabetes mellitus, type 2)     broderline  . CAD (coronary artery disease)     s/p CABG, s/p DES to LCX  January 2011  . OSA on CPAP   . Sleep apnea   . History of colonoscopy     Past Surgical History  Procedure Date  . Coronary artery bypass graft     stent  . Esophagogastroduodenoscopy 03-25-2005  . Nasal septum surgery   . Percutaneous placement intravascular stent cervical carotid artery     03-2009; using a drug-eluting platform of the circumflex cornoray artery with a 3.0 x 18 [employer] Promus drug-eluting platform post dilated to 3.75 with a noncompliant balloon.  . Cataract extraction 05/2010    left eye  . Cataract extraction 04/2010    right eye     Allergies  No Known Allergies  HPI [name] is a 66 year old white male who has a complex cardiovascular history as outlined in the past medical history. His last cardiac event was in January 2011 when he had a drug-eluting stent placed in the circumflex artery. He has had previous coronary bypass grafting. He has good left ventricular function. His saphenous vein graft to his right coronary artery and circumflex were occluded. His left internal mammary graft to LAD is open.  This morning he woke up and tried to get out of bed but had extreme dyspnea on exertion. He walked to check on his dogs but was so short of  breath he got back in bed. He has some mild chest tightness in the midsternal region. He denies any palpitations, diaphoresis nausea or vomiting. When his wife woke up, they called 911. His O2 sat was in the high 80s when they arrived.  Here he was found to be in A. fib with a rapid ventricular rate. Chest x-ray did not show any abnormalities other than atelectasis. Chest CT is negative for pulmonary embolus. First cardiac marker is negative. Rest his blood work is Chief Executive Officer for a white blood cell count 3000 and potassium 3.4.  He has no history of bleeding, he denies any melena.  Home Medications  Prior to Admission medications   Medication Sig Start Date End Date Taking? Authorizing Provider  allopurinol (ZYLOPRIM) 100 MG tablet Take 200 mg by mouth daily. Take 2 tablets by mouth once a day. 10/03/10  Yes [name], MD  aspirin 81 MG tablet Take 81 mg by mouth daily.     Yes Historical Provider, MD  benazepril (LOTENSIN) 5 MG tablet Take 5 mg by mouth daily.   Yes Historical Provider, MD  clopidogrel (PLAVIX) 75 MG tablet Take 75 mg by mouth daily. 03/19/11  Yes [name], MD  doxycycline (VIBRA-TABS) 100 MG tablet Take 100 mg by mouth 2 (two) times daily. 06/16/11 06/23/11 Yes  [name], MD  furosemide (LASIX) 40 MG tablet Take 40 mg by mouth daily as needed. For fluid retention   Yes Historical Provider, MD  levothyroxine (SYNTHROID, LEVOTHROID) 112 MCG tablet Take 112 mcg by mouth daily.   Yes Historical Provider, MD  metFORMIN (GLUCOPHAGE) 500 MG tablet Take 500 mg by mouth daily with breakfast. 11/26/10 11/26/11 Yes [name], MD  metoprolol succinate (TOPROL-XL) 25 MG 24 hr tablet Take 25 mg by mouth daily. 03/25/11  Yes [name], MD  nitroGLYCERIN (NITROSTAT) 0.4 MG SL tablet Place 0.4 mg under the tongue every 5 (five) minutes as needed. For chest pain Up to 3 doses   Yes Historical Provider, MD  NON FORMULARY 2 L/M for sleep    Yes Historical Provider, MD    pantoprazole (PROTONIX) 40 MG tablet Take 40 mg by mouth daily.   Yes Historical Provider, MD  rosuvastatin (CRESTOR) 40 MG tablet Take 40 mg by mouth daily.   Yes Historical Provider, MD  tiZANidine (ZANAFLEX) 2 MG tablet Take 1 by mouth at bedtime.    Yes Historical Provider, MD  traMADol (ULTRAM) 50 MG tablet Take 50 mg by mouth 2 (two) times daily.    Yes Historical Provider, MD  traMADol (ULTRAM-ER) 100 MG 24 hr tablet Take 100 mg by mouth every morning.     Yes Historical Provider, MD    Family History  Family History  Problem Relation Age of Onset  . Lung cancer Father     deceased  . Stroke Mother     deceased-MINISTROKES    Social History  History   Social History  . Marital Status: Married    Spouse Name: [name]    Number of Children: N/A  . Years of Education: N/A   Occupational History  . retired Web designer   Social History Main Topics  . Smoking status: Never Smoker   . Smokeless tobacco: Never Used  . Alcohol Use: No  . Drug Use: No  . Sexually Active: Not on file   Other Topics Concern  . Not on file   Social History Narrative  . No narrative on file     Review of Systems General:  No chills, fever, night sweats or weight changes.  Cardiovascular:   Other than dyspnea on exertion negative. Dermatological: No rash, lesions/masses Respiratory: No cough, dyspnea Urologic: No hematuria, dysuria Abdominal:   No nausea, vomiting, diarrhea, bright red blood per rectum, melena, or hematemesis Neurologic:  No visual changes, wkns, changes in mental status. All other systems reviewed and are otherwise negative except as noted above.  Physical Exam  Blood pressure 133/75, pulse 114, temperature 99.2 F (37.3 C), temperature source Rectal, resp. rate 14, height 6' 3.2" (1.91 m), weight 263 lb 0.1 oz (119.3 kg), SpO2 98.00%.  General: Pleasant, NAD, obese, Psych: Normal affect. Neuro: Alert and oriented X 3. Right facial droop, left  hemiparesis HEENT: Normal  Neck: Supple without bruits or JVD. Lungs:  Resp regular and unlabored, CTA. Heart: Irregular rate and rhythm no s3, s4, or murmurs. Abdomen: Soft, non-tender, non-distended, BS + x 4.  Extremities: No clubbing, cyanosis or edema. DP/PT/Radials 2+ and equal bilaterally.  Labs  No results found for this basename: CKTOTAL:4,CKMB:4,TROPONINI:4 in the last 72 hours Lab Results  Component Value Date   WBC 3.0* 06/17/2011   HGB 14.8 06/17/2011   HCT 43.9 06/17/2011   MCV 86.2 06/17/2011   PLT 120* 06/17/2011  Lab 06/17/11 1321  NA 142  K 3.4*  CL 103  CO2 26  BUN 22  CREATININE 1.07  CALCIUM 8.9  PROT 6.7  BILITOT 0.5  ALKPHOS 47  ALT 29  AST 35  GLUCOSE 110*   Lab Results  Component Value Date   CHOL 119 03/31/2011   HDL 50 03/31/2011   LDLCALC 46 03/31/2011   TRIG 116 03/31/2011   Lab Results  Component Value Date   DDIMER  Value: <0.22        AT THE INHOUSE ESTABLISHED CUTOFF VALUE OF 0.48 ug/mL FEU, THIS ASSAY HAS BEEN DOCUMENTED IN THE LITERATURE TO HAVE A SENSITIVITY AND NEGATIVE PREDICTIVE VALUE OF AT LEAST 98 TO 99%.  THE TEST RESULT SHOULD BE CORRELATED WITH AN ASSESSMENT OF THE CLINICAL PROBABILITY OF DVT / VTE. 03/18/2009     Radiology/Studies  Ct Angio Chest W/cm &/or Wo Cm  06/17/2011  *RADIOLOGY REPORT*  Clinical Data:  Shortness of breath, palpitations, congestion and weakness.  Significant hypoxia.  CT ANGIOGRAPHY CHEST WITH CONTRAST  Technique:  Multidetector CT imaging of the chest was performed using the standard protocol during bolus administration of intravenous contrast.  Multiplanar CT image reconstructions including MIPs were obtained to evaluate the vascular anatomy.  Contrast:  100 ml Omnipaque 350 IV  Comparison:  Chest x-ray earlier today.  Findings:  The pulmonary arteries are adequately opacified.  There is no evidence of acute pulmonary embolism.  The thoracic aorta is of normal caliber.  The heart size is normal status post prior  CABG.  There is no evidence of pulmonary edema, consolidation or nodule.  No significant pleural effusions.  Atelectasis present at the right lung base.  No enlarged lymph nodes.  Review of the MIP images confirms the above findings.  IMPRESSION: No evidence of pulmonary embolism.  Right basilar atelectasis.  Original Report Authenticated By: Reola Calkins, M.D.   Dg Chest Port 1 View  06/17/2011  *RADIOLOGY REPORT*  Clinical Data: Shortness of breath, congestion, cough  PORTABLE CHEST - 1 VIEW  Comparison: Chest x-ray of 05/17/2009  Findings: The lungs are not well aerated.  Mild atelectasis is noted at both lung bases.  No definite pneumonia or effusion is seen.  Cardiomegaly is stable.  Median sternotomy sutures are noted from prior CABG.  IMPRESSION: Poor aeration with bibasilar linear atelectasis.  No definite pneumonia or effusion.  Original Report Authenticated By: Juline Patch, M.D.    ECG Atrial fibrillation with a rapid ventricular rate 150 beats a minute. No ST segment changes.  ASSESSMENT AND PLAN  #1 paroxysmal A. fib with a rapid ventricular rate. He has slowed to a rate of about 90 beats and with intravenous diltiazem. #2 CAD S/P CABG and PCI currently stable #3 OSA #4 HTN #5 Hypokalemia #6 History of CVA #6 DM Type2 #7 Hyperlipidemia #8 Hypothyrodism  Will continue IV Diltiazem for rate control as well as Metoprolol. Begin IV Heparin and will need  long term oral anticoagulation with high CHADS2VASC score. Will check TSH as well. Signed, Valera Castle, MD 06/17/2011, @[facility]

## 2011-06-17 NOTE — ED Notes (Signed)
Patient undressed and in a gown. Cardiac monitor, pulse oximetry, and blood pressure cuff on. 

## 2011-06-17 NOTE — H&P (Signed)
History and Physical       Hospital Admission Note Date: 06/17/2011  Patient name: [name] Medical record number: [id number] Date of birth: 12/20/1945 Age: 66 y.o. Gender: male PCP: Estill Cotta, MD, MD  Primary cardiologist: Dr. Riley Kill Ulyess Mort)  Chief Complaint:  Shortness of breath worsened since morning  HPI: Patient is a 66 year old male with medical history significant for coronary artery disease status post CABG in January 2011, follows Dr. 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 History was obtained from the patient who stated that he was having upper respiratory symptoms with coughing and yellowish productive phlegm, congestion and cold since last Thursday 5 days ago. Patient called his PCP who prescribed him doxycycline. Patient also endorsed having significant generalized weakness and malaise for the last 5-6 days. Today however he felt severe shortness of breath and palpitations when he woke up. He was only able to walk about 4 or 5 steps before he felt dyspneic and had to lie down again. EMS was called by the patient and he was found to have O2 sats in low 80s on arrival. Patient was placed on oxygen via nasal cannula which improved his oxygen somewhat to high 80s. In the emergency room he was placed on nonrebreather mask and sats improved to high 90s. During evaluation patient was also found to have atrial fibrillation with RVR with heart rate in 140s. Patient also stated that he had a history of  atrial fibrillation prior to his CABG , he was briefly on 'blood thinner', however after the CABG he was in normal sinus rhythm and had no issues. His cardiologist is Dr. Tedra Senegal. Patient also mentioned that he has history of obstructive sleep apnea and is on CPAP at night  Review of Systems:  Constitutional: Denies fever, chills,  diaphoresis, appetite change. + fatigue.  HEENT: Denies photophobia, eye pain, redness, hearing loss, ear pain, congestion, sore throat, rhinorrhea, sneezing, mouth sores, trouble swallowing, neck pain, neck stiffness and tinnitus.   Respiratory: See history of present illness  Cardiodiovascular: See history of present illness   Gastrointestinal: Denies nausea, vomiting, abdominal pain, diarrhea, constipation, blood in stool and abdominal distention.  Genitourinary: Denies dysuria, urgency, frequency, hematuria, flank pain and difficulty urinating.  Musculoskeletal: Denies myalgias, back pain, joint swelling, arthralgias and gait problem.  Skin: Denies pallor, rash and wound.  Neurological: Denies any current dizziness, seizures, syncope, weakness, light-headedness, numbness and headaches.  Hematological: Denies adenopathy. Easy bruising, personal or family bleeding history  Psychiatric/Behavioral: Denies suicidal ideation, mood changes, confusion, nervousness, sleep disturbance and agitation  Past Medical History: Past Medical History  Diagnosis Date  . GERD (gastroesophageal reflux disease)   . Gout   . Hyperlipidemia   . Hypertension   . CVA (cerebral infarction)     left hemiparesis  . Chronic pain     left sided-Kristeins  . DM type 2 (diabetes mellitus, type 2)     broderline  . CAD (coronary artery disease)     s/p CABG, s/p DES to LCX  January 2011  . OSA on CPAP   . Sleep apnea   . History of colonoscopy    Past Surgical History  Procedure Date  . Coronary artery bypass graft     stent  . Esophagogastroduodenoscopy 03-25-2005  . Nasal septum surgery   . Percutaneous placement intravascular stent cervical carotid artery     03-2009; using a drug-eluting platform of the  circumflex cornoray artery with a 3.0 x 18 [employer] Promus drug-eluting platform post dilated to 3.75 with a noncompliant balloon.  . Cataract extraction 05/2010    left eye  . Cataract extraction  04/2010    right eye    Medications: Prior to Admission medications   Medication Sig Start Date End Date Taking? Authorizing Provider  allopurinol (ZYLOPRIM) 100 MG tablet Take 200 mg by mouth daily. Take 2 tablets by mouth once a day. 10/03/10  Yes [name], MD  aspirin 81 MG tablet Take 81 mg by mouth daily.     Yes Historical Provider, MD  benazepril (LOTENSIN) 5 MG tablet Take 5 mg by mouth daily.   Yes Historical Provider, MD  clopidogrel (PLAVIX) 75 MG tablet Take 75 mg by mouth daily. 03/19/11  Yes [name], MD  doxycycline (VIBRA-TABS) 100 MG tablet Take 100 mg by mouth 2 (two) times daily. 06/16/11 06/23/11 Yes [name], MD  furosemide (LASIX) 40 MG tablet Take 40 mg by mouth daily as needed. For fluid retention   Yes Historical Provider, MD  levothyroxine (SYNTHROID, LEVOTHROID) 112 MCG tablet Take 112 mcg by mouth daily.   Yes Historical Provider, MD  metFORMIN (GLUCOPHAGE) 500 MG tablet Take 500 mg by mouth daily with breakfast. 11/26/10 11/26/11 Yes [name], MD  metoprolol succinate (TOPROL-XL) 25 MG 24 hr tablet Take 25 mg by mouth daily. 03/25/11  Yes [name], MD  nitroGLYCERIN (NITROSTAT) 0.4 MG SL tablet Place 0.4 mg under the tongue every 5 (five) minutes as needed. For chest pain Up to 3 doses   Yes Historical Provider, MD  NON FORMULARY 2 L/M for sleep    Yes Historical Provider, MD  pantoprazole (PROTONIX) 40 MG tablet Take 40 mg by mouth daily.   Yes Historical Provider, MD  rosuvastatin (CRESTOR) 40 MG tablet Take 40 mg by mouth daily.   Yes Historical Provider, MD  tiZANidine (ZANAFLEX) 2 MG tablet Take 1 by mouth at bedtime.    Yes Historical Provider, MD  traMADol (ULTRAM) 50 MG tablet Take 50 mg by mouth 2 (two) times daily.    Yes Historical Provider, MD  traMADol (ULTRAM-ER) 100 MG 24 hr tablet Take 100 mg by mouth every morning.     Yes Historical Provider, MD    Allergies:  No Known Allergies  Social History:  reports that he has  never smoked. He has never used smokeless tobacco. He reports that he does not drink alcohol or use illicit drugs.  Family History: Family History  Problem Relation Age of Onset  . Lung cancer Father     deceased  . Stroke Mother     deceased-MINISTROKES    Physical Exam: Blood pressure 133/75, pulse 114, temperature 99.2 F (37.3 C), temperature source Rectal, resp. rate 14, height 6' 3.2" (1.91 m), weight 119.3 kg (263 lb 0.1 oz), SpO2 98.00%. General: Alert, awake, oriented x3, off the NRB mask HEENT: anicteric sclera, pink conjunctiva, pupils equal and reactive to light and accomodation Neck: supple, no masses or lymphadenopathy, no goiter, no bruits  Heart: Irregularly irregular rhythm, tachycardia Lungs: Clear to auscultation bilaterally, no wheezing, rales or rhonchi. Abdomen: Soft, nontender, nondistended, positive bowel sounds, no masses. Extremities: No clubbing, cyanosis or edema with positive pedal pulses. Neuro: Left-sided hemiparesis chronic residual from previous CVA  Psych: alert and oriented x 3, normal mood and affect Skin: no rashes or lesions, warm and dry   LABS on Admission:  Basic  Metabolic Panel:  Lab 06/17/11 7829  NA 142  K 3.4*  CL 103  CO2 26  GLUCOSE 110*  BUN 22  CREATININE 1.07  CALCIUM 8.9  MG --  PHOS --   Liver Function Tests:  Lab 06/17/11 1321  AST 35  ALT 29  ALKPHOS 47  BILITOT 0.5  PROT 6.7  ALBUMIN 3.4*   CBC:  Lab 06/17/11 1321  WBC 3.0*  NEUTROABS 1.4*  HGB 14.8  HCT 43.9  MCV 86.2  PLT 120*     Radiological Exams on Admission: Dg Chest Port 1 View  06/17/2011  *RADIOLOGY REPORT*  Clinical Data: Shortness of breath, congestion, cough  PORTABLE CHEST - 1 VIEW  Comparison: Chest x-ray of 05/17/2009  Findings: The lungs are not well aerated.  Mild atelectasis is noted at both lung bases.  No definite pneumonia or effusion is seen.  Cardiomegaly is stable.  Median sternotomy sutures are noted from prior CABG.   IMPRESSION: Poor aeration with bibasilar linear atelectasis.  No definite pneumonia or effusion.  Original Report Authenticated By: Juline Patch, M.D.    Assessment/Plan Present on Admission:   .Atrial fibrillation with RVR - Admit to step down unit, patient already started on a Cardizem drip, hold home beta blocker for now - Patient is on aspirin and Plavix, due to his previous history of CVA and coronary disease, CHADS 5, patient high risk for stroke, defer to cardiology for anticoagulation. Patient himself denies any prior history of GI bleed or any bleeding. - Cardiology consulted, obtain cardiac enzymes, TSH, rule out PE    .Acute hypoxic respiratory failure: Likely precipitated due to A. fib with RVR, recent upper respiratory symptoms/bronchitis - Obtain CT angiogram of the chest to rule out PE, place on Xopenex nebs when necessary, Levaquin IV, O2 via [place] - Obtain flu PCR, urine Legionella antigen, urine strep antigen, place on Levaquin IV - Placed on full dose Lovenox until PE ruled out  .HYPOTHYROIDISM: Obtain TSH, continue Synthroid   .HYPERLIPIDEMIA: Obtain lipid panel, continue Lipitor  .GOUT: Continue allopurinol   .OBSTRUCTIVE SLEEP APNEA: Place on CPAP bedtime   .HYPERTENSION: Continue Cardizem drip, lisinopril   .CAD, NATIVE VESSEL - Obtain serial cardiac enzymes, continue aspirin, Plavix,  ACE inhibitor, Cardizem drip   .STROKE: No new focal neurological deficits  - Continue aspirin, Plavix, await cardiology recommendations regarding anticoagulation   .GERD - Continue PPI   .DIABETES MELLITUS, TYPE II, BORDERLINE - Obtain HbA1c, continue sliding scale insulin   DVT prophylaxis: Full dose Lovenox until PE ruled out  CODE STATUS: Discussed in detail with the patient, up to be full CODE STATUS  Further plan will depend as patient's clinical course evolves and further radiologic and laboratory data become available.   @Time  Spent on Admission: 1  hour Larry Knipp M.D. [facility] 06/17/2011, 4:04 PM

## 2011-06-17 NOTE — ED Provider Notes (Signed)
Medical screening examination/treatment/procedure(s) were conducted as a shared visit with non-physician practitioner(s) and myself.  I personally evaluated the patient during the encounter  Patient low-grade temperature and trouble breathing. Also with tachyarrhythmia. Patient given Cardizem. Awaiting labs and x-rays  Toy Baker, MD 06/17/11 1244

## 2011-06-17 NOTE — Progress Notes (Addendum)
ANTICOAGULATION CONSULT NOTE - Initial Consult  Pharmacy Consult for lovenox Indication: Rule out Pulmonary Embolus>> new onset atrial fibrillation  No Known Allergies  Patient Measurements: Height: 6' 3.2" (191 cm) Weight: 263 lb 0.1 oz (119.3 kg) IBW/kg (Calculated) : 84.95  Heparin dosing weight: 110kg  Vital Signs: Temp: 99.2 F (37.3 C) (04/02 1212) Temp src: Rectal (04/02 1212) BP: 133/75 mmHg (04/02 1546) Pulse Rate: 114  (04/02 1546)  Labs:  Basename 06/17/11 1321  HGB 14.8  HCT 43.9  PLT 120*  APTT --  LABPROT --  INR --  HEPARINUNFRC --  CREATININE 1.07  CKTOTAL --  CKMB --  TROPONINI --   Estimated Creatinine Clearance: 92.2 ml/min (by C-G formula based on Cr of 1.07).  Medical History: Past Medical History  Diagnosis Date  . GERD (gastroesophageal reflux disease)   . Gout   . Hyperlipidemia   . Hypertension   . CVA (cerebral infarction)     left hemiparesis  . Chronic pain     left sided-Kristeins  . DM type 2 (diabetes mellitus, type 2)     broderline  . CAD (coronary artery disease)     s/p CABG, s/p DES to LCX  January 2011  . OSA on CPAP   . Sleep apnea   . History of colonoscopy     Medications:  Scheduled:    . sodium chloride   Intravenous Once  . sodium chloride   Intravenous STAT  . albuterol  2.5 mg Nebulization Once  . diltiazem  10 mg Intravenous Once  . enoxaparin (LOVENOX) injection  1 mg/kg Subcutaneous NOW  .  morphine injection  4 mg Intravenous Once  . sodium chloride  500 mL Intravenous Once    Assessment: 66 year old male admitted with shortness of breath since last Thursday which worsened today. Patient also tachycardic in atrial fibrillation with hr in 100s, now started on diltiazem. Will start lovenox until PE is ruled out by CT of the chest.  New onset afib: New orders received by cardiology to change therapy from lovenox to IV heparin and warfarin for afib. Chest CT was negative for PE. CHADS2 score  3-4(patient denies having diabetes).   Goal of Therapy:  Heparin level 0.3-0.7 INR goal 2-3   Plan:  Lovenox 120mg  sq q 12hours-d/c never started Follow results of PE work-up.>>negative Warfarin 7.5mg  tonight after INR results Heparin bolus of 4000 units Heparin drip at 1500 units/hr Daily CBC/HL Warfarin education materials ordered  Severiano Gilbert 06/17/2011,3:59 PM  Addendum: Baseline INR 1.0 - ok to give 7.5mg  of warfarin tonight

## 2011-06-17 NOTE — ED Provider Notes (Signed)
History     CSN: [id number]  Arrival date & time 06/17/11  1146   First MD Initiated Contact with Patient 06/17/11 1156      Chief Complaint  Patient presents with  . Shortness of Breath    (Consider location/radiation/quality/duration/timing/severity/associated sxs/prior treatment) HPI History from patient and EMS. 66 year old male has medical history of coronary artery disease, status post CABG, and, hypertension, CVA who presents with shortness of breath which started on Thursday. He states this has been worsening over the past several days. He has not had any associated chest pain, nausea, vomiting, diaphoresis, abd pain. He has had associated cough which has been productive of yellow sputum; he called his PCP's office yesterday and a prescription for doxycycline was called in.  He states that this was especially severe this morning when he woke up and went to let out the dogs. He was only able to walk about 4-5 steps before he felt too short of breath and had to lie down again. EMS was called and he was found to have an O2 sat in the low 80s on arrival. He was placed on [place] and improved to the high 80s. NRB was placed and sats improved to high 90s on 9L.   Cardiologist is Dr. Riley Kill.  Pt does have hx of OSA on CPAP at 2L at night.  Past Medical History  Diagnosis Date  . GERD (gastroesophageal reflux disease)   . Gout   . Hyperlipidemia   . Hypertension   . CVA (cerebral infarction)     left hemiparesis  . Chronic pain     left sided-Kristeins  . DM type 2 (diabetes mellitus, type 2)     broderline  . CAD (coronary artery disease)     s/p CABG, s/p DES to LCX  January 2011  . OSA on CPAP   . Sleep apnea   . History of colonoscopy     Past Surgical History  Procedure Date  . Coronary artery bypass graft     stent  . Esophagogastroduodenoscopy 03-25-2005  . Nasal septum surgery   . Percutaneous placement intravascular stent cervical carotid artery     03-2009; using a  drug-eluting platform of the circumflex cornoray artery with a 3.0 x 18 [employer] Promus drug-eluting platform post dilated to 3.75 with a noncompliant balloon.  . Cataract extraction 05/2010    left eye  . Cataract extraction 04/2010    right eye    Family History  Problem Relation Age of Onset  . Lung cancer Father     deceased  . Stroke Mother     deceased-MINISTROKES    History  Substance Use Topics  . Smoking status: Never Smoker   . Smokeless tobacco: Never Used  . Alcohol Use: No      Review of Systems  Constitutional: Negative for fever, chills, activity change and appetite change.  HENT: Negative for congestion and sore throat.   Eyes: Negative.   Respiratory: Positive for cough and shortness of breath.   Cardiovascular: Negative for chest pain and palpitations.  Gastrointestinal: Negative for nausea, vomiting and abdominal pain.  Genitourinary: Negative.   Musculoskeletal: Negative for myalgias.  Skin: Negative for color change and rash.  Neurological: Negative for dizziness and weakness.  Hematological: Negative.   Psychiatric/Behavioral: Negative.     Allergies  Review of patient's allergies indicates no known allergies.  Home Medications   Current Outpatient Rx  Name Route Sig Dispense Refill  . ALLOPURINOL 100 MG PO  TABS  Take 2 tablets by mouth once a day. 180 tablet 1    REQUESTING #180 TABLETS (90 DAYS)  . ALLOPURINOL 100 MG PO TABS  TAKE 2 TABLETS BY MOUTH EVERY DAY 180 tablet 1  . ASPIRIN 81 MG PO TABS Oral Take 81 mg by mouth daily.      Marland Kitchen BENAZEPRIL HCL 5 MG PO TABS  TAKE 1 TABLET BY MOUTH EVERY DAY 90 tablet 1  . CLOPIDOGREL BISULFATE 75 MG PO TABS Oral Take 1 tablet (75 mg total) by mouth daily. 90 tablet 1  . CRESTOR 40 MG PO TABS  TAKE 1 TABLET BY MOUTH EVERY DAY 90 tablet 1  . DOXYCYCLINE HYCLATE 100 MG PO TABS Oral Take 1 tablet (100 mg total) by mouth 2 (two) times daily. 14 tablet 0  . FUROSEMIDE 40 MG PO TABS  TAKE 1 TABLET BY  MOUTH EVERY DAY AS DIRECTED AS NEEDED 90 tablet 1  . LEVOTHYROXINE SODIUM 112 MCG PO TABS  TAKE 1 TABLET EVERY DAY 30 tablet 2  . METFORMIN HCL 500 MG PO TABS Oral Take 1 tablet (500 mg total) by mouth daily with breakfast. 30 tablet 6  . METOPROLOL SUCCINATE ER 25 MG PO TB24 Oral Take 1 tablet (25 mg total) by mouth daily. 90 tablet 0  . NITROGLYCERIN 0.4 MG SL SUBL Sublingual Place 0.4 mg under the tongue every 5 (five) minutes as needed. Up to 3 doses     . NON FORMULARY  2 L/M for sleep     . PANTOPRAZOLE SODIUM 40 MG PO TBEC  TAKE 1 TABLET EVERY DAY 90 tablet 0  . PANTOPRAZOLE SODIUM 40 MG PO TBEC  TAKE 1 TABLET BY MOUTH EVERY DAY 90 tablet 1    PT REQUEST A 90 DAY SUPPLY  . TESTOSTERONE 50 MG/5GM TD GEL Transdermal Place 5 g onto the skin daily. Apply 4 pumps to shoulder or upper arm daily 5 Tube 3  . TIZANIDINE HCL 2 MG PO TABS  Take 1 by mouth at bedtime.     . TRAMADOL HCL 50 MG PO TABS Oral Take 50 mg by mouth 2 (two) times daily.      . TRAMADOL HCL ER 100 MG PO TB24 Oral Take 100 mg by mouth every morning.        BP 120/74  Pulse 70  Temp(Src) 97.6 F (36.4 C) (Oral)  Resp 18  SpO2 100%  Physical Exam  Nursing note and vitals reviewed. Constitutional: He is oriented to person, place, and time. He appears well-developed and well-nourished. He appears distressed.       On NRB. Answers questions with several word responses.  HENT:  Head: Normocephalic and atraumatic.  Right Ear: External ear normal.  Left Ear: External ear normal.  Mouth/Throat: Oropharynx is clear and moist. No oropharyngeal exudate.  Eyes: EOM are normal. Pupils are equal, round, and reactive to light.  Neck: Normal range of motion. Neck supple.  Cardiovascular: Normal rate, regular rhythm and normal heart sounds.   Pulmonary/Chest: Effort normal and breath sounds normal. He has no wheezes. He exhibits no tenderness.  Abdominal: Soft. Bowel sounds are normal. There is no tenderness. There is no rebound  and no guarding.  Musculoskeletal: Normal range of motion. He exhibits no edema.  Neurological: He is alert and oriented to person, place, and time. No cranial nerve deficit.  Skin: Skin is warm and dry. No rash noted. He is not diaphoretic.  Psychiatric: He has a normal mood  and affect.    ED Course  Procedures (including critical care time)   Date: 06/17/2011  Rate: 146  Rhythm: atrial fibrillation  QRS Axis: left  Intervals: unable to measure  ST/T Wave abnormalities: normal  Conduction Disutrbances:left anterior fascicular block  Narrative Interpretation: LVH  Old EKG Reviewed: Jan 2011 shows sinus brady with LAD and LVH  Labs Reviewed  CBC - Abnormal; Notable for the following:    WBC 3.0 (*)    Platelets 120 (*)    All other components within normal limits  DIFFERENTIAL - Abnormal; Notable for the following:    Neutro Abs 1.4 (*)    All other components within normal limits  COMPREHENSIVE METABOLIC PANEL - Abnormal; Notable for the following:    Potassium 3.4 (*)    Glucose, Bld 110 (*)    Albumin 3.4 (*)    GFR calc non Af Amer 69 (*)    GFR calc Af Amer 80 (*)    All other components within normal limits  LACTIC ACID, PLASMA  POCT I-STAT TROPONIN I  CULTURE, BLOOD (ROUTINE X 2)  CULTURE, BLOOD (ROUTINE X 2)   Dg Chest Port 1 View  06/17/2011  *RADIOLOGY REPORT*  Clinical Data: Shortness of breath, congestion, cough  PORTABLE CHEST - 1 VIEW  Comparison: Chest x-ray of 05/17/2009  Findings: The lungs are not well aerated.  Mild atelectasis is noted at both lung bases.  No definite pneumonia or effusion is seen.  Cardiomegaly is stable.  Median sternotomy sutures are noted from prior CABG.  IMPRESSION: Poor aeration with bibasilar linear atelectasis.  No definite pneumonia or effusion.  Original Report Authenticated By: Juline Patch, M.D.     1. New onset atrial fibrillation       MDM  12:12 PM Patient assessed. Moderate distress. On nonrebreather. Tachycardic  with atrial fibrillation which is new as compared with old EKG. He is afebrile. Discussed case with Dr. Freida Busman who also saw the patient with me. CBC, CMP, troponin, lactate, cultures, portable chest ordered. Cardizem and fluids ordered to lower heart rate.  1:39 PM Rate decreased to ~100 with 10mg  Cardizem and fluids.  2:33 PM Discussed findings with patient. On reassessment, patient appears much more comfortable and has been weaned to a nonrebreather on 4L. Remains in afib with rate fluctuating from ~105-120. Cardizem drip initiated. Labs thus far generally unremarkable. Given new AF will plan to admit for rate control, further workup.  3:00 PM I talked with Dr. Isidoro Donning with [facility]. She agrees to admit the patient to stepdown for further evaluation and treatment. Requests CT angio to r/o PE which I have ordered. Holding orders placed.   Grant Fontana, [place] 06/17/11 1536

## 2011-06-17 NOTE — ED Notes (Signed)
Pt c/o SOB over past 2 days with productive cough. Pt states he has not "felt well" over the last week with fatigue and generalized weakness. Brought in by EMS from home. Pt PCP called in doxycycline for pt yesterday. Pt did not see PCP.

## 2011-06-17 NOTE — ED Notes (Signed)
Pt has been feeling bad for over 1 week.  SOB started two days ago.  Hacking cough for 1 week.  Pt denies history of CHF.  Pt staetes he has had Afib before his bipass but not since.  Pt alert and Oriented X4

## 2011-06-17 NOTE — ED Notes (Signed)
Called report to [phone] 

## 2011-06-17 NOTE — Progress Notes (Signed)
Critical results and abnormal results called to Maren Reamer with [facility] no new orders

## 2011-06-18 DIAGNOSIS — I517 Cardiomegaly: Secondary | ICD-10-CM

## 2011-06-18 LAB — CARDIAC PANEL(CRET KIN+CKTOT+MB+TROPI)
CK, MB: 6.3 ng/mL (ref 0.3–4.0)
CK, MB: 6.4 ng/mL (ref 0.3–4.0)
CK, MB: 6.4 ng/mL (ref 0.3–4.0)
Total CK: 260 U/L — ABNORMAL HIGH (ref 7–232)
Troponin I: <0.3 ng/mL (ref <0.30)

## 2011-06-18 LAB — CBC
HCT: 38.7 % — ABNORMAL LOW (ref 39.0–52.0)
MCH: 28.5 pg (ref 26.0–34.0)
MCHC: 33.6 g/dL (ref 30.0–36.0)
MCV: 84.9 fL (ref 78.0–100.0)
Platelets: 126 10*3/uL — ABNORMAL LOW (ref 150–400)
RDW: 13 % (ref 11.5–15.5)
WBC: 4.4 10*3/uL (ref 4.0–10.5)

## 2011-06-18 LAB — BASIC METABOLIC PANEL
BUN: 21 mg/dL (ref 6–23)
CO2: 25 mEq/L (ref 19–32)
Chloride: 104 mEq/L (ref 96–112)
Creatinine, Ser: 1.02 mg/dL (ref 0.50–1.35)
Glucose, Bld: 112 mg/dL — ABNORMAL HIGH (ref 70–99)
Potassium: 3.5 mEq/L (ref 3.5–5.1)

## 2011-06-18 LAB — LIPID PANEL
HDL: 30 mg/dL — ABNORMAL LOW (ref >39)
LDL Cholesterol: 32 mg/dL (ref 0–99)
Total CHOL/HDL Ratio: 2.7 RATIO
Triglycerides: 91 mg/dL (ref <150)
VLDL: 18 mg/dL (ref 0–40)

## 2011-06-18 LAB — LEGIONELLA ANTIGEN, URINE: Legionella Antigen, Urine: NEGATIVE

## 2011-06-18 LAB — PROTIME-INR: Prothrombin Time: 15 seconds (ref 11.6–15.2)

## 2011-06-18 LAB — GLUCOSE, CAPILLARY
Glucose-Capillary: 91 mg/dL (ref 70–99)
Glucose-Capillary: 119 mg/dL — ABNORMAL HIGH (ref 70–99)

## 2011-06-18 LAB — INFLUENZA PANEL BY PCR (TYPE A & B)
H1N1 flu by pcr: NOT DETECTED
Influenza B By PCR: POSITIVE — AB

## 2011-06-18 MED ORDER — DILTIAZEM HCL 60 MG PO TABS
60.0000 mg | ORAL_TABLET | Freq: Four times a day (QID) | ORAL | Status: DC
  Administered 2011-06-18 (×3): 60 mg via ORAL
  Filled 2011-06-18 (×8): qty 1

## 2011-06-18 MED ORDER — OSELTAMIVIR PHOSPHATE 75 MG PO CAPS
75.0000 mg | ORAL_CAPSULE | Freq: Two times a day (BID) | ORAL | Status: DC
  Administered 2011-06-18 – 2011-06-21 (×7): 75 mg via ORAL
  Filled 2011-06-18 (×8): qty 1

## 2011-06-18 MED ORDER — WARFARIN SODIUM 7.5 MG PO TABS
7.5000 mg | ORAL_TABLET | Freq: Once | ORAL | Status: AC
  Administered 2011-06-18: 7.5 mg via ORAL
  Filled 2011-06-18: qty 1

## 2011-06-18 MED FILL — Perflutren Lipid Microsphere IV Susp 6.52 MG/ML: INTRAVENOUS | Qty: 2 | Status: AC

## 2011-06-18 NOTE — Progress Notes (Signed)
ANTICOAGULATION CONSULT NOTE - Follow Up Consult  Pharmacy Consult for Heparin/Coumadin Indication: atrial fibrillation  No Known Allergies  Vital Signs: Temp: 98 F (36.7 C) (04/03 0700) Temp src: Oral (04/03 0700) BP: 131/75 mmHg (04/03 0900) Pulse Rate: 76  (04/03 0900)  Labs:  Basename 06/18/11 0512 06/18/11 0055 06/17/11 2346 06/17/11 1729 06/17/11 1321  HGB 13.0 -- -- -- 14.8  HCT 38.7* -- -- -- 43.9  PLT 126* -- -- -- 120*  APTT -- -- -- -- --  LABPROT 15.0 -- -- 14.0 --  INR 1.16 -- -- 1.06 --  HEPARINUNFRC 0.55 0.56 -- -- --  CREATININE 1.02 -- -- -- 1.07  CKTOTAL 239* -- 260* 273* --  CKMB 6.4* -- 6.4* 6.9* --  TROPONINI <0.30 -- <0.30 <0.30 --   Estimated Creatinine Clearance: 93.8 ml/min (by C-G formula based on Cr of 1.02).  Medications:  Heparin @ 1500 units/hr  Assessment: 66yom continues on heparin to coumadin bridge for new onset afib.  Heparin level is therapeutic. INR remains below goal after first dose of coumadin.  No bleeding noted per chart notes. Thrombocytopenia noted.  Goal of Therapy:  Heparin level 0.3-0.7 units/ml INR 2-3   Plan:  1) Continue heparin at 1500 units/hr 2) Repeat coumadin 7.5mg  x 1 3) Follow up heparin level and INR in AM  Fredrik Rigger 06/18/2011,10:31 AM

## 2011-06-18 NOTE — Progress Notes (Signed)
Subjective:  He feels better.  Now getting treatment for flu.  Rate is improved.    Objective:  Vital Signs in the last 24 hours: Temp:  [97.6 F (36.4 C)-99.2 F (37.3 C)] 98 F (36.7 C) (04/03 0700) Pulse Rate:  [58-125] 76  (04/03 0900) Resp:  [13-21] 13  (04/03 0900) BP: (99-133)/(42-93) 131/75 mmHg (04/03 0900) SpO2:  [96 %-100 %] 99 % (04/03 0900) Weight:  [247 lb 12.8 oz (112.4 kg)-263 lb 0.1 oz (119.3 kg)] 247 lb 12.8 oz (112.4 kg) (04/02 1735)  Intake/Output from previous day: 04/02 0701 - 04/03 0700 In: 510 [P.O.:360; IV Piggyback:150] Out: 800 [Urine:800]   Physical Exam: General: Well developed, well nourished, in no acute distress. Head:  Slight ronchhii.   Lungs: Clear to auscultation and percussion. Heart: irregularly irregular rhythm.  Pulses: Pulses normal in all 4 extremities. Extremities: No clubbing or cyanosis. No edema. Neurologic: Alert and oriented x 3.    Lab Results:  Basename 06/18/11 0512 06/17/11 1321  WBC 4.4 3.0*  HGB 13.0 14.8  PLT 126* 120*    Basename 06/18/11 0512 06/17/11 1321  NA 139 142  K 3.5 3.4*  CL 104 103  CO2 25 26  GLUCOSE 112* 110*  BUN 21 22  CREATININE 1.02 1.07    Basename 06/18/11 0512 06/17/11 2346  TROPONINI <0.30 <0.30   Hepatic Function Panel  Basename 06/17/11 1321  PROT 6.7  ALBUMIN 3.4*  AST 35  ALT 29  ALKPHOS 47  BILITOT 0.5  BILIDIR --  IBILI --    Basename 06/18/11 0512  CHOL 80   No results found for this basename: PROTIME in the last 72 hours  Imaging: Ct Angio Chest W/cm &/or Wo Cm  06/17/2011  *RADIOLOGY REPORT*  Clinical Data:  Shortness of breath, palpitations, congestion and weakness.  Significant hypoxia.  CT ANGIOGRAPHY CHEST WITH CONTRAST  Technique:  Multidetector CT imaging of the chest was performed using the standard protocol during bolus administration of intravenous contrast.  Multiplanar CT image reconstructions including MIPs were obtained to evaluate the vascular  anatomy.  Contrast:  100 ml Omnipaque 350 IV  Comparison:  Chest x-ray earlier today.  Findings:  The pulmonary arteries are adequately opacified.  There is no evidence of acute pulmonary embolism.  The thoracic aorta is of normal caliber.  The heart size is normal status post prior CABG.  There is no evidence of pulmonary edema, consolidation or nodule.  No significant pleural effusions.  Atelectasis present at the right lung base.  No enlarged lymph nodes.  Review of the MIP images confirms the above findings.  IMPRESSION: No evidence of pulmonary embolism.  Right basilar atelectasis.  Original Report Authenticated By: Reola Calkins, M.D.   Dg Chest Port 1 View  06/17/2011  *RADIOLOGY REPORT*  Clinical Data: Shortness of breath, congestion, cough  PORTABLE CHEST - 1 VIEW  Comparison: Chest x-ray of 05/17/2009  Findings: The lungs are not well aerated.  Mild atelectasis is noted at both lung bases.  No definite pneumonia or effusion is seen.  Cardiomegaly is stable.  Median sternotomy sutures are noted from prior CABG.  IMPRESSION: Poor aeration with bibasilar linear atelectasis.  No definite pneumonia or effusion.  Original Report Authenticated By: Juline Patch, M.D.    EKG:  Atrial fib.  Rate 88.  Nonspecific T flattening.  Leftward axis.  No acute changes.      Assessment/Plan:  Patient Active Hospital Problem List: Atrial fibrillation with RVR (06/17/2011)  Assessment: rate is better at present.     Plan: change over to oral dilt and taper IV as tolerated.   HYPOTHYROIDISM (04/02/2009)   Assessment: TSH is therapeutic.    Plan: continue CAD, NATIVE VESSEL (05/04/2009)   Assessment: may need to consider warfarin.  Enzymes are borderline, likely due to demand ischemia.     Plan: recheck ecg and enzymes.  Then check.         Shawnie Pons, MD, [facility], FSCAI 06/18/2011, 11:54 AM

## 2011-06-18 NOTE — Progress Notes (Signed)
PROGRESS NOTE  [name] XBJ:[id number] DOB: 1945/08/05 DOA: 06/17/2011 PCP: [name], [name], MD, MD Cardiologist: Arvilla Meres, M.D.  Brief narrative: 66 year old man with history of coronary artery disease status post CABG presented with productive cough and shortness of breath for approximately one week. Started on doxycycline by his primary care physician the day before admission. Found to have atrial fibrillation with rapid ventricular response and hypoxia.  Past medical history: Stroke, diabetes mellitus type 2, hypertension, gout, Obstructive sleep apnea on CPAP/2 L at night, carotid stent  Consultants:  Cardiology  Procedures:  None  Interim History: Chart reviewed in detail. Summarize as above. Remains tachycardic. Blood pressure borderline low. CT chest angiogram negative.  Subjective: Continues to feel poorly. Complains of shortness of breath. Developed 8/10 chest pain this morning. Present for approximately 2 hours.  Objective: Filed Vitals:   06/18/11 0300 06/18/11 0401 06/18/11 0500 06/18/11 0700  BP: 115/67  122/72 99/68  Pulse: 119  107 116  Temp:  97.6 F (36.4 C)    TempSrc:  Oral    Resp: 15  13 14   Height:      Weight:      SpO2: 97%  97% 98%    Intake/Output Summary (Last 24 hours) at 06/18/11 0812 Last data filed at 06/18/11 0500  Gross per 24 hour  Intake    510 ml  Output    800 ml  Net   -290 ml    Exam:   General:  Appears calm and comfortable.  Cardiovascular: Irregular. Tachycardic. No murmur, rub, gallop. No lower extremity edema the  Respiratory: Clear to auscultation bilaterally. No wheezes, rales, rhonchi. Normal respiratory effort.  Psychiatric: Grossly normal mood and affect. Speech fluent and appropriate.  Neurologic: Appears grossly normal.  Data Reviewed: Basic Metabolic Panel:  Lab 06/18/11 3086 06/17/11 1321  NA 139 142  K 3.5 3.4*  CL 104 103  CO2 25 26  GLUCOSE 112* 110*  BUN 21 22    CREATININE 1.02 1.07  CALCIUM 8.5 8.9  MG -- --  PHOS -- --   Liver Function Tests:  Lab 06/17/11 1321  AST 35  ALT 29  ALKPHOS 47  BILITOT 0.5  PROT 6.7  ALBUMIN 3.4*   CBC:  Lab 06/18/11 0512 06/17/11 1321  WBC 4.4 3.0*  NEUTROABS -- 1.4*  HGB 13.0 14.8  HCT 38.7* 43.9  MCV 84.9 86.2  PLT 126* 120*   Cardiac Enzymes:  Lab 06/18/11 0512 06/17/11 2346 06/17/11 1729  CKTOTAL 239* 260* 273*  CKMB 6.4* 6.4* 6.9*  CKMBINDEX -- -- --  TROPONINI <0.30 <0.30 <0.30   CBG:  Lab 06/17/11 2144  GLUCAP 100*    Recent Results (from the past 240 hour(s))  MRSA PCR SCREENING     Status: Normal   Collection Time   06/17/11  5:41 PM      Component Value Range Status Comment   MRSA by PCR NEGATIVE  NEGATIVE  Final      Studies: Ct Angio Chest W/cm &/or Wo Cm  06/17/2011  *RADIOLOGY REPORT*  Clinical Data:  Shortness of breath, palpitations, congestion and weakness.  Significant hypoxia.  CT ANGIOGRAPHY CHEST WITH CONTRAST  Technique:  Multidetector CT imaging of the chest was performed using the standard protocol during bolus administration of intravenous contrast.  Multiplanar CT image reconstructions including MIPs were obtained to evaluate the vascular anatomy.  Contrast:  100 ml Omnipaque 350 IV  Comparison:  Chest x-ray earlier today.  Findings:  The pulmonary arteries are adequately opacified.  There is no evidence of acute pulmonary embolism.  The thoracic aorta is of normal caliber.  The heart size is normal status post prior CABG.  There is no evidence of pulmonary edema, consolidation or nodule.  No significant pleural effusions.  Atelectasis present at the right lung base.  No enlarged lymph nodes.  Review of the MIP images confirms the above findings.  IMPRESSION: No evidence of pulmonary embolism.  Right basilar atelectasis.  Original Report Authenticated By: Reola Calkins, M.D.   Dg Chest Port 1 View  06/17/2011  *RADIOLOGY REPORT*  Clinical Data: Shortness of  breath, congestion, cough  PORTABLE CHEST - 1 VIEW  Comparison: Chest x-ray of 05/17/2009  Findings: The lungs are not well aerated.  Mild atelectasis is noted at both lung bases.  No definite pneumonia or effusion is seen.  Cardiomegaly is stable.  Median sternotomy sutures are noted from prior CABG.  IMPRESSION: Poor aeration with bibasilar linear atelectasis.  No definite pneumonia or effusion.  Original Report Authenticated By: Juline Patch, M.D.   Scheduled Meds:   . sodium chloride   Intravenous Once  . allopurinol  200 mg Oral Daily  . aspirin EC  81 mg Oral Daily  . benazepril  5 mg Oral Daily  . clopidogrel  75 mg Oral Daily  . diltiazem  10 mg Intravenous Once  . heparin  4,000 Units Intravenous Once  . insulin aspart  0-5 Units Subcutaneous QHS  . insulin aspart  0-9 Units Subcutaneous TID WC  . levofloxacin (LEVAQUIN) IV  750 mg Intravenous Q24H  . levothyroxine  112 mcg Oral QAC breakfast  .  morphine injection  4 mg Intravenous Once  . pantoprazole  40 mg Oral Q1200  . patient's guide to using coumadin book   Does not apply Once  . potassium chloride  40 mEq Oral BID  . rosuvastatin  40 mg Oral q1800  . sodium chloride  500 mL Intravenous Once  . sodium chloride  3 mL Intravenous Q12H  . tiZANidine  2 mg Oral QHS  . traMADol  50 mg Oral BID  . warfarin  7.5 mg Oral ONCE-1800  . warfarin   Does not apply Once  . Warfarin - Pharmacist Dosing Inpatient   Does not apply q1800  . DISCONTD: sodium chloride   Intravenous STAT  . DISCONTD: albuterol  2.5 mg Nebulization Once  . DISCONTD: aspirin  81 mg Oral Daily  . DISCONTD: atorvastatin  80 mg Oral q1800  . DISCONTD: enoxaparin (LOVENOX) injection  1 mg/kg Subcutaneous NOW  . DISCONTD: enoxaparin (LOVENOX) injection  1 mg/kg Subcutaneous Q12H   Continuous Infusions:   . diltiazem (CARDIZEM) infusion 15 mg/hr (06/18/11 0700)  . heparin 1,500 Units/hr (06/18/11 0700)  . DISCONTD: diltiazem (CARDIZEM) infusion        Assessment/Plan: 1. Atrial fibrillation with rapid ventricular response: Cardiology consult appreciated. Rate improved. Will defer management to cardiology. TSH within normal limits. Heparin infusion. Long-term anticoagulation recommended. 2. Acute hypoxic respiratory failure: Appears resolved. Multifactorial: Atrial fibrillation, upper respiratory tract infection/bronchitis. Followup CT angiogram chest negative for pulmonary embolism or acute pulmonary process. Continue Levaquin. 3. Minimal CK/CK-MB elevation: Likely secondary to strain. Significance of chest pain unclear. Followup EKG and cardiology recommendations. 4. Thrombocytopenia: Appears to be chronic, intermittent. Seen March 2011, January 2011, December 2010, November 2009. Continue heparin for now. 5. Diabetes mellitus type 2: Continue sliding scale insulin. Hold metformin while inpatient. Hemoglobin A1c 5.9.  6. Hypothyroidism: Continue replacement therapy. TSH within normal limits. 7. Obstructive sleep apnea: Continue CPAP at night. 8. History of coronary artery disease status post PCI: Per cardiology. Continue aspirin Plavix.  Code Status: Full code Family Communication: None at bedside Disposition Plan: Pending further evaluation and treatment. Okay with cardiology that likely transferred to medical floor with telemetry today.   Brendia Sacks, MD  [facility] Pager [phone] 06/18/2011, 8:12 AM    LOS: 1 day

## 2011-06-18 NOTE — Progress Notes (Signed)
Utilization Review Completed.[name] T4/05/2011   

## 2011-06-18 NOTE — Progress Notes (Signed)
Echocardiogram 2D Echocardiogram with Definity has been performed.  Glean Salen [facility] 06/18/2011, 12:48 PM

## 2011-06-18 NOTE — Progress Notes (Signed)
ANTICOAGULATION CONSULT NOTE - Follow Up Consult  Pharmacy Consult for heparin Indication: r/o PE, new onset afib  No Known Allergies  Patient Measurements: Height: 6\' 3"  (190.5 cm) Weight: 247 lb 12.8 oz (112.4 kg) IBW/kg (Calculated) : 84.5  Heparin Dosing Weight: 110 kg  Vital Signs: Temp: 98.1 F (36.7 C) (04/03 0006) Temp src: Oral (04/03 0006) BP: 126/60 mmHg (04/03 0000) Pulse Rate: 125  (04/03 0000)  Labs:  Basename 06/18/11 0055 06/17/11 2346 06/17/11 1729 06/17/11 1321  HGB -- -- -- 14.8  HCT -- -- -- 43.9  PLT -- -- -- 120*  APTT -- -- -- --  LABPROT -- -- 14.0 --  INR -- -- 1.06 --  HEPARINUNFRC 0.56 -- -- --  CREATININE -- -- -- 1.07  CKTOTAL -- 260* 273* --  CKMB -- 6.4* 6.9* --  TROPONINI -- <0.30 <0.30 --   Estimated Creatinine Clearance: 89.4 ml/min (by C-G formula based on Cr of 1.07).   Medications:  Infusions:    . diltiazem (CARDIZEM) infusion 10 mg/hr (06/18/11 0000)  . heparin 1,500 Units/hr (06/18/11 0000)  . DISCONTD: diltiazem (CARDIZEM) infusion      Assessment: 66 year old male admitted with shortness of breath since last Thursday which worsened day of admit. Patient also tachycardic in atrial fibrillation with hr in 100s, started on diltiazem.   Chest CT negative for PE. CHADS2 score 3-4 (pt denies DM).  Heparin level currently at goal.  No complications noted.  Goal of Therapy:  INR 2-3 Heparin level 0.3-0.7 units/ml   Plan:  1.  Continue heparin at 1500 units/hr 2.  F/u repeat level with am labs   Khrystal Jeanmarie L. Illene Bolus, PharmD, BCPS Clinical Pharmacist Pager: [phone] 06/18/2011 2:04 AM

## 2011-06-19 LAB — GLUCOSE, CAPILLARY
Glucose-Capillary: 121 mg/dL — ABNORMAL HIGH (ref 70–99)
Glucose-Capillary: 111 mg/dL — ABNORMAL HIGH (ref 70–99)
Glucose-Capillary: 101 mg/dL — ABNORMAL HIGH (ref 70–99)

## 2011-06-19 LAB — HEPARIN LEVEL (UNFRACTIONATED): Heparin Unfractionated: 0.93 IU/mL — ABNORMAL HIGH (ref 0.30–0.70)

## 2011-06-19 LAB — CBC
HCT: 36.8 % — ABNORMAL LOW (ref 39.0–52.0)
MCHC: 33.7 g/dL (ref 30.0–36.0)
Platelets: 110 10*3/uL — ABNORMAL LOW (ref 150–400)
RDW: 13 % (ref 11.5–15.5)
WBC: 3.2 10*3/uL — ABNORMAL LOW (ref 4.0–10.5)

## 2011-06-19 LAB — PROTIME-INR: Prothrombin Time: 23.3 seconds — ABNORMAL HIGH (ref 11.6–15.2)

## 2011-06-19 MED ORDER — WARFARIN SODIUM 2.5 MG PO TABS
2.5000 mg | ORAL_TABLET | Freq: Once | ORAL | Status: AC
  Administered 2011-06-19: 2.5 mg via ORAL
  Filled 2011-06-19 (×2): qty 1

## 2011-06-19 MED ORDER — SODIUM CHLORIDE 0.9 % IV SOLN: 250.0000 mL | INTRAVENOUS | Status: DC

## 2011-06-19 MED ORDER — HYDROCORTISONE 1 % EX CREA
1.0000 "application " | TOPICAL_CREAM | Freq: Three times a day (TID) | CUTANEOUS | Status: DC | PRN
  Filled 2011-06-19: qty 28

## 2011-06-19 MED ORDER — HEPARIN (PORCINE) IN NACL 100-0.45 UNIT/ML-% IJ SOLN
1250.0000 [IU]/h | INTRAMUSCULAR | Status: DC
  Filled 2011-06-19: qty 250

## 2011-06-19 MED ORDER — SODIUM CHLORIDE 0.9 % IJ SOLN
3.0000 mL | INTRAMUSCULAR | Status: DC | PRN
  Administered 2011-06-19: 10 mL via INTRAVENOUS

## 2011-06-19 MED ORDER — SODIUM CHLORIDE 0.9 % IJ SOLN
3.0000 mL | Freq: Two times a day (BID) | INTRAMUSCULAR | Status: DC
  Administered 2011-06-19 (×2): 3 mL via INTRAVENOUS

## 2011-06-19 MED ORDER — POTASSIUM CHLORIDE CRYS ER 20 MEQ PO TBCR
EXTENDED_RELEASE_TABLET | ORAL | Status: AC
  Filled 2011-06-19: qty 1

## 2011-06-19 MED ORDER — DILTIAZEM HCL ER COATED BEADS 180 MG PO CP24
180.0000 mg | ORAL_CAPSULE | Freq: Every day | ORAL | Status: DC
  Administered 2011-06-19 – 2011-06-21 (×3): 180 mg via ORAL
  Filled 2011-06-19 (×3): qty 1

## 2011-06-19 NOTE — Progress Notes (Signed)
Patient ID: [name], male   DOB: 18-Oct-1945, 66 y.o.   MRN: [id number]    SUBJECTIVE: Still feels weak but doing better.  No dyspnea.  Remains in atrial fibrillation, rate controlled.      Marland Kitchen allopurinol  200 mg Oral Daily  . aspirin EC  81 mg Oral Daily  . benazepril  5 mg Oral Daily  . clopidogrel  75 mg Oral Daily  . diltiazem  60 mg Oral Q6H  . insulin aspart  0-5 Units Subcutaneous QHS  . insulin aspart  0-9 Units Subcutaneous TID WC  . levothyroxine  112 mcg Oral QAC breakfast  . oseltamivir  75 mg Oral BID  . pantoprazole  40 mg Oral Q1200  . patient's guide to using coumadin book   Does not apply Once  . potassium chloride  40 mEq Oral BID  . rosuvastatin  40 mg Oral q1800  . sodium chloride  3 mL Intravenous Q12H  . tiZANidine  2 mg Oral QHS  . traMADol  50 mg Oral BID  . warfarin  2.5 mg Oral ONCE-1800  . warfarin  7.5 mg Oral ONCE-1800  . warfarin   Does not apply Once  . Warfarin - Pharmacist Dosing Inpatient   Does not apply q1800  . DISCONTD: levofloxacin (LEVAQUIN) IV  750 mg Intravenous Q24H  heparin gtt    Filed Vitals:   06/18/11 2000 06/18/11 2346 06/19/11 0400 06/19/11 0500  BP: 128/71 111/68 98/63   Pulse: 69 62 57   Temp:  97.9 F (36.6 C) 97.4 F (36.3 C)   TempSrc:   Oral   Resp: 15 16 15    Height:      Weight:    246 lb 7.6 oz (111.8 kg)  SpO2: 99% 98% 99%     Intake/Output Summary (Last 24 hours) at 06/19/11 0803 Last data filed at 06/19/11 0600  Gross per 24 hour  Intake   1390 ml  Output    500 ml  Net    890 ml    LABS: Basic Metabolic Panel:  Basename 06/18/11 0512 06/17/11 1321  NA 139 142  K 3.5 3.4*  CL 104 103  CO2 25 26  GLUCOSE 112* 110*  BUN 21 22  CREATININE 1.02 1.07  CALCIUM 8.5 8.9  MG -- --  PHOS -- --   Liver Function Tests:  Basename 06/17/11 1321  AST 35  ALT 29  ALKPHOS 47  BILITOT 0.5  PROT 6.7  ALBUMIN 3.4*   No results found for this basename: LIPASE:2,AMYLASE:2 in the last 72  hours CBC:  Basename 06/19/11 0500 06/18/11 0512 06/17/11 1321  WBC 3.2* 4.4 --  NEUTROABS -- -- 1.4*  HGB 12.4* 13.0 --  HCT 36.8* 38.7* --  MCV 85.2 84.9 --  PLT 110* 126* --   Cardiac Enzymes:  Basename 06/18/11 1220 06/18/11 0512 06/17/11 2346  CKTOTAL 202 239* 260*  CKMB 6.3* 6.4* 6.4*  CKMBINDEX -- -- --  TROPONINI <0.30 <0.30 <0.30   BNP: No components found with this basename: POCBNP:3 D-Dimer: No results found for this basename: DDIMER:2 in the last 72 hours Hemoglobin A1C:  Basename 06/17/11 1729  HGBA1C 5.9*   Fasting Lipid Panel:  Basename 06/18/11 0512  CHOL 80  HDL 30*  LDLCALC 32  TRIG 91  CHOLHDL 2.7  LDLDIRECT --   Thyroid Function Tests:  Basename 06/17/11 1729  TSH 1.623  T4TOTAL --  T3FREE --  THYROIDAB --   Anemia Panel: No  results found for this basename: VITAMINB12,FOLATE,FERRITIN,TIBC,IRON,RETICCTPCT in the last 72 hours  RADIOLOGY: Ct Angio Chest W/cm &/or Wo Cm  06/17/2011  *RADIOLOGY REPORT*  Clinical Data:  Shortness of breath, palpitations, congestion and weakness.  Significant hypoxia.  CT ANGIOGRAPHY CHEST WITH CONTRAST  Technique:  Multidetector CT imaging of the chest was performed using the standard protocol during bolus administration of intravenous contrast.  Multiplanar CT image reconstructions including MIPs were obtained to evaluate the vascular anatomy.  Contrast:  100 ml Omnipaque 350 IV  Comparison:  Chest x-ray earlier today.  Findings:  The pulmonary arteries are adequately opacified.  There is no evidence of acute pulmonary embolism.  The thoracic aorta is of normal caliber.  The heart size is normal status post prior CABG.  There is no evidence of pulmonary edema, consolidation or nodule.  No significant pleural effusions.  Atelectasis present at the right lung base.  No enlarged lymph nodes.  Review of the MIP images confirms the above findings.  IMPRESSION: No evidence of pulmonary embolism.  Right basilar atelectasis.   Original Report Authenticated By: Reola Calkins, M.D.   Dg Chest Port 1 View  06/17/2011  *RADIOLOGY REPORT*  Clinical Data: Shortness of breath, congestion, cough  PORTABLE CHEST - 1 VIEW  Comparison: Chest x-ray of 05/17/2009  Findings: The lungs are not well aerated.  Mild atelectasis is noted at both lung bases.  No definite pneumonia or effusion is seen.  Cardiomegaly is stable.  Median sternotomy sutures are noted from prior CABG.  IMPRESSION: Poor aeration with bibasilar linear atelectasis.  No definite pneumonia or effusion.  Original Report Authenticated By: Juline Patch, M.D.    PHYSICAL EXAM General: NAD Neck: JVP 7-8 cm, no thyromegaly or thyroid nodule.  Lungs: Clear to auscultation bilaterally with normal respiratory effort. CV: Nondisplaced PMI.  Heart irregular S1/S2, no S3/S4, no murmur.  No peripheral edema.  No carotid bruit.  Abdomen: Soft, nontender, no hepatosplenomegaly, no distention.  Neurologic: Alert and oriented x 3.  Psych: Normal affect. Extremities: No clubbing or cyanosis.   TELEMETRY: Reviewed telemetry pt in atrial fibrillation, rate 50s-60s  ASSESSMENT AND PLAN:  66 yo admitted with influenza B who also developed atrial fibrillation with RVR in the setting of the flu.  1. Atrial fibrillation: Remains in afib, now rate-controlled.  INR therapeutic on coumadin.  EF 50-55%.  - Stop heparin gtt today.  - Change diltiazem to diltiazem CD. - 1st episode of atrial fibrillation: plan for TEE-guided DCCV tomorrow (TEE b/c not sure of duration of afib prior to admission).  2. CAD: Stable with no chest pain.  Cardiac enzymes negative.  He can stop Plavix since we have started coumadin (PCI in 1/11).  3. Influenza: Per hospitalist, he is on oseltamivir.   [name] 06/19/2011 8:07 AM

## 2011-06-19 NOTE — Progress Notes (Signed)
PROGRESS NOTE  [name] WUJ:[id number] DOB: Jun 15, 1945 DOA: 06/17/2011 PCP: [name], [name], MD, MD Cardiologist: Arvilla Meres, M.D.  Brief narrative: 66 year old man with history of coronary artery disease status post CABG presented with productive cough and shortness of breath for approximately one week. Found to have atrial fibrillation with rapid ventricular response and hypoxia.  Past medical history: Stroke, diabetes mellitus type 2, hypertension, gout, Obstructive sleep apnea on CPAP/2 L at night, carotid stent  Consultants:  Cardiology  Procedures:  None  Interim History: Interval documentation reviewed. Influenza positive. Heart rate now controlled. TEE guided DCCV planned for tomorrow.  Subjective: Feels fine.  Objective: Filed Vitals:   06/19/11 0800 06/19/11 0812 06/19/11 0833 06/19/11 0834  BP: 127/67     Pulse: 83     Temp:    97.3 F (36.3 C)  TempSrc:    Oral  Resp: 15 17    Height:      Weight:      SpO2: 98% 98% 99%     Intake/Output Summary (Last 24 hours) at 06/19/11 0914 Last data filed at 06/19/11 0800  Gross per 24 hour  Intake   1380 ml  Output    500 ml  Net    880 ml    Exam:   General:  Appears calm and comfortable.  Cardiovascular: Irregular. Regular rate No murmur, rub, gallop. No lower extremity edema the  Respiratory: Clear to auscultation bilaterally. No wheezes, rales, rhonchi. Normal respiratory effort.  Psychiatric: Grossly normal mood and affect. Speech fluent and appropriate.  Data Reviewed: Basic Metabolic Panel:  Lab 06/18/11 9562 06/17/11 1321  NA 139 142  K 3.5 3.4*  CL 104 103  CO2 25 26  GLUCOSE 112* 110*  BUN 21 22  CREATININE 1.02 1.07  CALCIUM 8.5 8.9  MG -- --  PHOS -- --   Liver Function Tests:  Lab 06/17/11 1321  AST 35  ALT 29  ALKPHOS 47  BILITOT 0.5  PROT 6.7  ALBUMIN 3.4*   CBC:  Lab 06/19/11 0500 06/18/11 0512 06/17/11 1321  WBC 3.2* 4.4 3.0*  NEUTROABS -- --  1.4*  HGB 12.4* 13.0 14.8  HCT 36.8* 38.7* 43.9  MCV 85.2 84.9 86.2  PLT 110* 126* 120*   Cardiac Enzymes:  Lab 06/18/11 1220 06/18/11 0512 06/17/11 2346 06/17/11 1729  CKTOTAL 202 239* 260* 273*  CKMB 6.3* 6.4* 6.4* 6.9*  CKMBINDEX -- -- -- --  TROPONINI <0.30 <0.30 <0.30 <0.30   CBG:  Lab 06/19/11 0830 06/18/11 2202 06/18/11 1620 06/18/11 1157 06/17/11 2144  GLUCAP 122* 94 119* 91 100*    Recent Results (from the past 240 hour(s))  CULTURE, BLOOD (ROUTINE X 2)     Status: Normal (Preliminary result)   Collection Time   06/17/11  1:10 PM      Component Value Range Status Comment   Specimen Description BLOOD HAND RIGHT   Final    Special Requests     Final    Value: BOTTLES DRAWN AEROBIC AND ANAEROBIC 10CC AER 5CC ANA   Culture  Setup Time [id number]   Final    Culture     Final    Value:        BLOOD CULTURE RECEIVED NO GROWTH TO DATE CULTURE WILL BE HELD FOR 5 DAYS BEFORE ISSUING A FINAL NEGATIVE REPORT   Report Status PENDING   Incomplete   CULTURE, BLOOD (ROUTINE X 2)     Status: Normal (Preliminary result)  Collection Time   06/17/11  1:20 PM      Component Value Range Status Comment   Specimen Description BLOOD ARM RIGHT   Final    Special Requests BOTTLES DRAWN AEROBIC AND ANAEROBIC 10CC   Final    Culture  Setup Time [id number]   Final    Culture     Final    Value:        BLOOD CULTURE RECEIVED NO GROWTH TO DATE CULTURE WILL BE HELD FOR 5 DAYS BEFORE ISSUING A FINAL NEGATIVE REPORT   Report Status PENDING   Incomplete   MRSA PCR SCREENING     Status: Normal   Collection Time   06/17/11  5:41 PM      Component Value Range Status Comment   MRSA by PCR NEGATIVE  NEGATIVE  Final      Studies: Ct Angio Chest W/cm &/or Wo Cm  06/17/2011  *RADIOLOGY REPORT*  Clinical Data:  Shortness of breath, palpitations, congestion and weakness.  Significant hypoxia.  CT ANGIOGRAPHY CHEST WITH CONTRAST  Technique:  Multidetector CT imaging of the chest was performed using the  standard protocol during bolus administration of intravenous contrast.  Multiplanar CT image reconstructions including MIPs were obtained to evaluate the vascular anatomy.  Contrast:  100 ml Omnipaque 350 IV  Comparison:  Chest x-ray earlier today.  Findings:  The pulmonary arteries are adequately opacified.  There is no evidence of acute pulmonary embolism.  The thoracic aorta is of normal caliber.  The heart size is normal status post prior CABG.  There is no evidence of pulmonary edema, consolidation or nodule.  No significant pleural effusions.  Atelectasis present at the right lung base.  No enlarged lymph nodes.  Review of the MIP images confirms the above findings.  IMPRESSION: No evidence of pulmonary embolism.  Right basilar atelectasis.  Original Report Authenticated By: Reola Calkins, M.D.   Dg Chest Port 1 View  06/17/2011  *RADIOLOGY REPORT*  Clinical Data: Shortness of breath, congestion, cough  PORTABLE CHEST - 1 VIEW  Comparison: Chest x-ray of 05/17/2009  Findings: The lungs are not well aerated.  Mild atelectasis is noted at both lung bases.  No definite pneumonia or effusion is seen.  Cardiomegaly is stable.  Median sternotomy sutures are noted from prior CABG.  IMPRESSION: Poor aeration with bibasilar linear atelectasis.  No definite pneumonia or effusion.  Original Report Authenticated By: Juline Patch, M.D.   Scheduled Meds:    . allopurinol  200 mg Oral Daily  . aspirin EC  81 mg Oral Daily  . benazepril  5 mg Oral Daily  . diltiazem  180 mg Oral Daily  . insulin aspart  0-5 Units Subcutaneous QHS  . insulin aspart  0-9 Units Subcutaneous TID WC  . levothyroxine  112 mcg Oral QAC breakfast  . oseltamivir  75 mg Oral BID  . pantoprazole  40 mg Oral Q1200  . patient's guide to using coumadin book   Does not apply Once  . potassium chloride  40 mEq Oral BID  . rosuvastatin  40 mg Oral q1800  . sodium chloride  3 mL Intravenous Q12H  . sodium chloride  3 mL Intravenous  Q12H  . tiZANidine  2 mg Oral QHS  . traMADol  50 mg Oral BID  . warfarin  2.5 mg Oral ONCE-1800  . warfarin  7.5 mg Oral ONCE-1800  . warfarin   Does not apply Once  . Warfarin - Pharmacist Dosing Inpatient  Does not apply q1800  . DISCONTD: clopidogrel  75 mg Oral Daily  . DISCONTD: diltiazem  60 mg Oral Q6H  . DISCONTD: levofloxacin (LEVAQUIN) IV  750 mg Intravenous Q24H   Continuous Infusions:    . sodium chloride    . diltiazem (CARDIZEM) infusion 15 mg/hr (06/18/11 1135)  . DISCONTD: heparin 1,500 Units/hr (06/18/11 1011)  . DISCONTD: heparin       Assessment/Plan: 1. Atrial fibrillation with rapid ventricular response: Cardizem and warfarin per cardiology. TSH within normal limits. DCCV tomorrow. 2. Acute hypoxic respiratory failure: Resolved. Multifactorial: Atrial fibrillation with rapid ventricular response and influenza B. CT chest negative for pulmonary embolism or acute pulmonary process.  3. Influenza B: Tamiflu. 4. Minimal CK/CK-MB elevation: Likely secondary to strain/demand ischemia.  5. Thrombocytopenia: Appears to be chronic, intermittent. Seen March 2011, January 2011, December 2010, November 2009. Continue to follow. 6. Diabetes mellitus type 2: Stable. Continue sliding scale insulin. Hold metformin while inpatient. Hemoglobin A1c 5.9. 7. Hypothyroidism: Continue replacement therapy. TSH within normal limits. 8. Obstructive sleep apnea: Continue CPAP at night. 9. History of coronary artery disease status post PCI: Per cardiology. Continue aspirin. Cardiology recommended stopping Plavix.  Code Status: Full code Family Communication: None at bedside Disposition Plan: Pending further evaluation and treatment. Okay with cardiology that likely transferred to medical floor with telemetry today.   Brendia Sacks, MD  [facility] Pager [phone] 06/19/2011, 9:14 AM    LOS: 2 days

## 2011-06-19 NOTE — Progress Notes (Signed)
ANTICOAGULATION CONSULT NOTE - Follow Up Consult  Pharmacy Consult for Heparin/Coumadin Indication: atrial fibrillation  No Known Allergies  Vital Signs: Temp: 97.4 F (36.3 C) (04/04 0400) Temp src: Oral (04/04 0400) BP: 98/63 mmHg (04/04 0400) Pulse Rate: 57  (04/04 0400)  Labs:  Basename 06/19/11 0500 06/18/11 1220 06/18/11 0512 06/18/11 0055 06/17/11 2346 06/17/11 1729 06/17/11 1321  HGB 12.4* -- 13.0 -- -- -- --  HCT 36.8* -- 38.7* -- -- -- 43.9  PLT PENDING -- 126* -- -- -- 120*  APTT -- -- -- -- -- -- --  LABPROT 23.3* -- 15.0 -- -- 14.0 --  INR 2.03* -- 1.16 -- -- 1.06 --  HEPARINUNFRC 0.93* -- 0.55 0.56 -- -- --  CREATININE -- -- 1.02 -- -- -- 1.07  CKTOTAL -- 202 239* -- 260* -- --  CKMB -- 6.3* 6.4* -- 6.4* -- --  TROPONINI -- <0.30 <0.30 -- <0.30 -- --   Estimated Creatinine Clearance: 93.5 ml/min (by C-G formula based on Cr of 1.02).  Assessment: 66 yo male with new onset afib for anticoagulation.  Large increase in INR as well as heparin level this morning.  Labs drawn appropriately from opposite arm.    Goal of Therapy:  Heparin level 0.3-0.7 units/ml INR 2-3   Plan:  Hold Heparin x 1 hour, then decrease heparin 1250 units/hr Check heparin level in 8 hours. Coumadin 2.5 mg today.  [name], Gary Fleet 06/19/2011,7:30 AM

## 2011-06-20 ENCOUNTER — Other Ambulatory Visit: Payer: Self-pay

## 2011-06-20 ENCOUNTER — Encounter (HOSPITAL_COMMUNITY): Payer: Self-pay | Admitting: *Deleted

## 2011-06-20 ENCOUNTER — Encounter (HOSPITAL_COMMUNITY): Payer: Self-pay | Admitting: Critical Care Medicine

## 2011-06-20 ENCOUNTER — Inpatient Hospital Stay (HOSPITAL_COMMUNITY): Payer: Medicare Other | Admitting: Critical Care Medicine

## 2011-06-20 ENCOUNTER — Encounter (HOSPITAL_COMMUNITY)
Admission: EM | Disposition: A | Discharge status: Home / Self Care | Payer: Self-pay | Source: Home / Self Care | Attending: Family Medicine

## 2011-06-20 DIAGNOSIS — I4891 Unspecified atrial fibrillation: Secondary | ICD-10-CM

## 2011-06-20 HISTORY — PX: TEE WITHOUT CARDIOVERSION: SHX5443

## 2011-06-20 HISTORY — PX: CARDIOVERSION: SHX1299

## 2011-06-20 LAB — CBC
MCH: 28.2 pg (ref 26.0–34.0)
Platelets: 138 10*3/uL — ABNORMAL LOW (ref 150–400)
RBC: 4.4 MIL/uL (ref 4.22–5.81)
RDW: 13 % (ref 11.5–15.5)

## 2011-06-20 LAB — GLUCOSE, CAPILLARY
Glucose-Capillary: 92 mg/dL (ref 70–99)
Glucose-Capillary: 99 mg/dL (ref 70–99)
Glucose-Capillary: 81 mg/dL (ref 70–99)

## 2011-06-20 LAB — BASIC METABOLIC PANEL
CO2: 27 mEq/L (ref 19–32)
Calcium: 8.9 mg/dL (ref 8.4–10.5)
GFR calc Af Amer: 87 mL/min — ABNORMAL LOW (ref >90)
GFR calc non Af Amer: 75 mL/min — ABNORMAL LOW (ref >90)
Sodium: 140 mEq/L (ref 135–145)

## 2011-06-20 LAB — PROTIME-INR: Prothrombin Time: 32.6 seconds — ABNORMAL HIGH (ref 11.6–15.2)

## 2011-06-20 SURGERY — CARDIOVERSION
Anesthesia: General

## 2011-06-20 MED ORDER — SODIUM CHLORIDE 0.9 % IV SOLN
250.0000 mL | INTRAVENOUS | Status: DC | PRN
  Administered 2011-06-20: 15:00:00 via INTRAVENOUS

## 2011-06-20 MED ORDER — FENTANYL CITRATE 0.05 MG/ML IJ SOLN
INTRAMUSCULAR | Status: AC
  Filled 2011-06-20: qty 2

## 2011-06-20 MED ORDER — MIDAZOLAM HCL 10 MG/2ML IJ SOLN
INTRAMUSCULAR | Status: AC
  Filled 2011-06-20: qty 2

## 2011-06-20 MED ORDER — SODIUM CHLORIDE 0.9 % IJ SOLN
3.0000 mL | Freq: Two times a day (BID) | INTRAMUSCULAR | Status: DC
  Administered 2011-06-21: 11:00:00 via INTRAVENOUS

## 2011-06-20 MED ORDER — FENTANYL CITRATE 0.05 MG/ML IJ SOLN: 250.0000 ug | Freq: Once | INTRAMUSCULAR | Status: DC

## 2011-06-20 MED ORDER — BENZOCAINE 20 % MT SOLN: 1.0000 "application " | OROMUCOSAL | Status: DC | PRN

## 2011-06-20 MED ORDER — MIDAZOLAM HCL 10 MG/2ML IJ SOLN
INTRAMUSCULAR | Status: DC | PRN
  Administered 2011-06-20: 2 mg via INTRAVENOUS
  Administered 2011-06-20: 1 mg via INTRAVENOUS

## 2011-06-20 MED ORDER — MIDAZOLAM HCL 10 MG/2ML IJ SOLN: 10.0000 mg | Freq: Once | INTRAMUSCULAR | Status: DC

## 2011-06-20 MED ORDER — SODIUM CHLORIDE 0.9 % IV SOLN
INTRAVENOUS | Status: DC
  Administered 2011-06-20: 500 mL via INTRAVENOUS

## 2011-06-20 MED ORDER — FENTANYL CITRATE 0.05 MG/ML IJ SOLN
INTRAMUSCULAR | Status: DC | PRN
Start: 2011-06-20 — End: 2011-06-20
  Administered 2011-06-20 (×2): 25 ug via INTRAVENOUS

## 2011-06-20 MED ORDER — SODIUM CHLORIDE 0.45 % IV SOLN: INTRAVENOUS | Status: DC

## 2011-06-20 MED ORDER — DIPHENHYDRAMINE HCL 50 MG/ML IJ SOLN
INTRAMUSCULAR | Status: AC
  Filled 2011-06-20: qty 1

## 2011-06-20 MED ORDER — BUTAMBEN-TETRACAINE-BENZOCAINE 2-2-14 % EX AERO
INHALATION_SPRAY | CUTANEOUS | Status: DC | PRN
  Administered 2011-06-20: 2 via TOPICAL

## 2011-06-20 MED ORDER — SODIUM CHLORIDE 0.9 % IJ SOLN: 3.0000 mL | INTRAMUSCULAR | Status: DC | PRN

## 2011-06-20 MED ORDER — PROPOFOL 10 MG/ML IV BOLUS
INTRAVENOUS | Status: DC | PRN
  Administered 2011-06-20: 60 mg via INTRAVENOUS

## 2011-06-20 NOTE — Progress Notes (Signed)
  Echocardiogram Echocardiogram Transesophageal has been performed.  Margues Filippini, Real Cons 06/20/2011, 3:46 PM

## 2011-06-20 NOTE — Anesthesia Postprocedure Evaluation (Signed)
  Anesthesia Post-op Note  Patient: [name]  Procedure(s) Performed: Procedure(s) (LRB): TRANSESOPHAGEAL ECHOCARDIOGRAM WITH CARDIOVERSION ()  Patient Location: Endoscopy Unit  Anesthesia Type: General  Level of Consciousness: awake, alert  and oriented  Airway and Oxygen Therapy: Patient Spontanous Breathing and Patient connected to nasal cannula oxygen  Post-op Pain: none  Post-op Assessment: Post-op Vital signs reviewed, Patient's Cardiovascular Status Stable, Respiratory Function Stable, Patent Airway and No signs of Nausea or vomiting  Post-op Vital Signs: Reviewed and stable  Complications: No apparent anesthesia complications

## 2011-06-20 NOTE — Progress Notes (Signed)
Patient ID: LAJARVIS ITALIANO, male   DOB: 11/16/1945, 66 y.o.   MRN: [id number]     SUBJECTIVE: Still feels weak but doing better.  No dyspnea.  Remains in atrial fibrillation, rate controlled.      Marland Kitchen allopurinol  200 mg Oral Daily  . aspirin EC  81 mg Oral Daily  . benazepril  5 mg Oral Daily  . diltiazem  180 mg Oral Daily  . fentaNYL  250 mcg Intravenous Once  . insulin aspart  0-5 Units Subcutaneous QHS  . insulin aspart  0-9 Units Subcutaneous TID WC  . levothyroxine  112 mcg Oral QAC breakfast  . midazolam  10 mg Intravenous Once  . oseltamivir  75 mg Oral BID  . pantoprazole  40 mg Oral Q1200  . patient's guide to using coumadin book   Does not apply Once  . potassium chloride SA      . potassium chloride  40 mEq Oral BID  . rosuvastatin  40 mg Oral q1800  . sodium chloride  3 mL Intravenous Q12H  . sodium chloride  3 mL Intravenous Q12H  . sodium chloride  3 mL Intravenous Q12H  . tiZANidine  2 mg Oral QHS  . traMADol  50 mg Oral BID  . warfarin  2.5 mg Oral ONCE-1800  . Warfarin - Pharmacist Dosing Inpatient   Does not apply q1800  . DISCONTD: clopidogrel  75 mg Oral Daily  . DISCONTD: diltiazem  60 mg Oral Q6H  heparin gtt    Filed Vitals:   06/19/11 1600 06/19/11 2015 06/20/11 0033 06/20/11 0330  BP: 120/52 132/86  104/60  Pulse: 69 90  66  Temp: 97.9 F (36.6 C) 98.3 F (36.8 C) 97.7 F (36.5 C) 97.4 F (36.3 C)  TempSrc: Oral Oral Oral Oral  Resp:  18  18  Height:      Weight:      SpO2: 100% 100%      Intake/Output Summary (Last 24 hours) at 06/20/11 0730 Last data filed at 06/19/11 2200  Gross per 24 hour  Intake   1560 ml  Output    725 ml  Net    835 ml    LABS: Basic Metabolic Panel:  Basename 06/20/11 0535 06/18/11 0512  NA 140 139  K 3.9 3.5  CL 106 104  CO2 27 25  GLUCOSE 100* 112*  BUN 13 21  CREATININE 1.00 1.02  CALCIUM 8.9 8.5  MG -- --  PHOS -- --   Liver Function Tests:  Basename 06/17/11 1321  AST 35  ALT 29    ALKPHOS 47  BILITOT 0.5  PROT 6.7  ALBUMIN 3.4*   No results found for this basename: LIPASE:2,AMYLASE:2 in the last 72 hours CBC:  Basename 06/20/11 0535 06/19/11 0500 06/17/11 1321  WBC 3.6* 3.2* --  NEUTROABS -- -- 1.4*  HGB 12.4* 12.4* --  HCT 37.7* 36.8* --  MCV 85.7 85.2 --  PLT 138* 110* --   Cardiac Enzymes:  Basename 06/18/11 1220 06/18/11 0512 06/17/11 2346  CKTOTAL 202 239* 260*  CKMB 6.3* 6.4* 6.4*  CKMBINDEX -- -- --  TROPONINI <0.30 <0.30 <0.30   BNP: No components found with this basename: POCBNP:3 D-Dimer: No results found for this basename: DDIMER:2 in the last 72 hours Hemoglobin A1C:  Basename 06/17/11 1729  HGBA1C 5.9*   Fasting Lipid Panel:  Basename 06/18/11 0512  CHOL 80  HDL 30*  LDLCALC 32  TRIG 91  CHOLHDL 2.7  LDLDIRECT --   Thyroid Function Tests:  Basename 06/17/11 1729  TSH 1.623  T4TOTAL --  T3FREE --  THYROIDAB --   Anemia Panel: No results found for this basename: VITAMINB12,FOLATE,FERRITIN,TIBC,IRON,RETICCTPCT in the last 72 hours  RADIOLOGY: Ct Angio Chest W/cm &/or Wo Cm  06/17/2011  *RADIOLOGY REPORT*  Clinical Data:  Shortness of breath, palpitations, congestion and weakness.  Significant hypoxia.  CT ANGIOGRAPHY CHEST WITH CONTRAST  Technique:  Multidetector CT imaging of the chest was performed using the standard protocol during bolus administration of intravenous contrast.  Multiplanar CT image reconstructions including MIPs were obtained to evaluate the vascular anatomy.  Contrast:  100 ml Omnipaque 350 IV  Comparison:  Chest x-ray earlier today.  Findings:  The pulmonary arteries are adequately opacified.  There is no evidence of acute pulmonary embolism.  The thoracic aorta is of normal caliber.  The heart size is normal status post prior CABG.  There is no evidence of pulmonary edema, consolidation or nodule.  No significant pleural effusions.  Atelectasis present at the right lung base.  No enlarged lymph nodes.   Review of the MIP images confirms the above findings.  IMPRESSION: No evidence of pulmonary embolism.  Right basilar atelectasis.  Original Report Authenticated By: Reola Calkins, M.D.   Dg Chest Port 1 View  06/17/2011  *RADIOLOGY REPORT*  Clinical Data: Shortness of breath, congestion, cough  PORTABLE CHEST - 1 VIEW  Comparison: Chest x-ray of 05/17/2009  Findings: The lungs are not well aerated.  Mild atelectasis is noted at both lung bases.  No definite pneumonia or effusion is seen.  Cardiomegaly is stable.  Median sternotomy sutures are noted from prior CABG.  IMPRESSION: Poor aeration with bibasilar linear atelectasis.  No definite pneumonia or effusion.  Original Report Authenticated By: Juline Patch, M.D.    PHYSICAL EXAM General: NAD Neck: JVP 7-8 cm, no thyromegaly or thyroid nodule.  Lungs: Clear to auscultation bilaterally with normal respiratory effort. CV: Nondisplaced PMI.  Heart irregular S1/S2, no S3/S4, no murmur.  No peripheral edema.  No carotid bruit.  Abdomen: Soft, nontender, no hepatosplenomegaly, no distention.  Neurologic: Alert and oriented x 3.  Psych: Normal affect. Extremities: No clubbing or cyanosis.   TELEMETRY: Reviewed telemetry pt in atrial fibrillation, rate 50s-60s  ASSESSMENT AND PLAN:  66 yo admitted with influenza B who also developed atrial fibrillation with RVR in the setting of the flu.  1. Atrial fibrillation: Remains in afib, now rate-controlled.  INR therapeutic on coumadin.  EF 50-55%.  - Continue diltiazem CD. - 1st episode of atrial fibrillation: plan for TEE-guided DCCV this afternoon (TEE b/c not sure of duration of afib prior to admission).  2. CAD: Stable with no chest pain.  Cardiac enzymes negative.  Plavix stopped since we have started coumadin (PCI in 1/11).  3. Influenza: Per hospitalist, he is on oseltamivir.   [name] 06/20/2011 7:30 AM

## 2011-06-20 NOTE — Procedures (Signed)
Procedure: TEE  Indication: Atrial fibrillation, INR > 2 on coumadin.  Plan cardioversion.   Sedation: Versed 3 mg IV, Fentanyl 25 mcg IV  Findings: Please see echo section for full report.  LV EF appeared normal, estimated 55-60%.  No LAA thrombus.  OK to proceed to DCCV.  No complications.

## 2011-06-20 NOTE — Anesthesia Preprocedure Evaluation (Addendum)
Anesthesia Evaluation  Patient identified by MRN, date of birth, ID band Patient awake    Reviewed: Allergy & Precautions, H&P , NPO status , Patient's Chart, lab work & pertinent test results  Airway Mallampati: II  Neck ROM: Full    Dental  (+) Dental Advisory Given   Pulmonary sleep apnea and Oxygen sleep apnea ,  2L at night         Cardiovascular hypertension, + CAD     Neuro/Psych CVA    GI/Hepatic GERD-  ,  Endo/Other  Hypothyroidism   Renal/GU      Musculoskeletal   Abdominal   Peds  Hematology   Anesthesia Other Findings   Reproductive/Obstetrics                         Anesthesia Physical Anesthesia Plan  ASA: III  Anesthesia Plan: General   Post-op Pain Management:    Induction: Intravenous  Airway Management Planned: Mask  Additional Equipment:   Intra-op Plan:   Post-operative Plan:   Informed Consent: I have reviewed the patients History and Physical, chart, labs and discussed the procedure including the risks, benefits and alternatives for the proposed anesthesia with the patient or authorized representative who has indicated his/her understanding and acceptance.   Dental advisory given  Plan Discussed with: CRNA  Anesthesia Plan Comments:        Anesthesia Quick Evaluation

## 2011-06-20 NOTE — Transfer of Care (Signed)
Immediate Anesthesia Transfer of Care Note  Patient: [name]  Procedure(s) Performed: Procedure(s) (LRB): TRANSESOPHAGEAL ECHOCARDIOGRAM WITH CARDIOVERSION ()  Patient Location: PACU and Endoscopy Unit  Anesthesia Type: General  Level of Consciousness: awake and alert   Airway & Oxygen Therapy: Patient Spontanous Breathing and Patient connected to nasal cannula oxygen  Post-op Assessment: Report given to PACU RN, Post -op Vital signs reviewed and stable and Patient moving all extremities X 4  Post vital signs: Reviewed and stable  Complications: No apparent anesthesia complications

## 2011-06-20 NOTE — Progress Notes (Signed)
PROGRESS NOTE  [name]:[id number] DOB: Jun 25, 1945 DOA: 06/17/2011 PCP: [name], [name], MD, MD Cardiologist: Arvilla Meres, M.D.  Brief narrative: 66 year old man with history of coronary artery disease status post CABG presented with productive cough and shortness of breath for approximately one week. Found to have atrial fibrillation with rapid ventricular response and hypoxia.  Past medical history: Stroke, diabetes mellitus type 2, hypertension, gout, Obstructive sleep apnea on CPAP/2 L at night, carotid stent  Consultants:  Cardiology  Procedures:  April 3: 2-D echocardiogram: Left ventricular ejection fraction 50-55%. Normal wall motion.  Interim History: Interval documentation reviewed.  TEE guided DCCV planned for today.  Subjective: Feels okay.  Objective: Filed Vitals:   06/19/11 1600 06/19/11 2015 06/20/11 0033 06/20/11 0330  BP: 120/52 132/86  104/60  Pulse: 69 90  66  Temp: 97.9 F (36.6 C) 98.3 F (36.8 C) 97.7 F (36.5 C) 97.4 F (36.3 C)  TempSrc: Oral Oral Oral Oral  Resp:  18  18  Height:      Weight:      SpO2: 100% 100%      Intake/Output Summary (Last 24 hours) at 06/20/11 0745 Last data filed at 06/19/11 2200  Gross per 24 hour  Intake   1560 ml  Output    725 ml  Net    835 ml    Exam:   General:  Appears calm and comfortable.  Cardiovascular: Irregular. Normal rate. No murmur, rub, gallop. No lower extremity edema.  Respiratory: Clear to auscultation bilaterally. No wheezes, rales, rhonchi. Normal respiratory effort.  Psychiatric: Grossly normal mood and affect. Speech fluent and appropriate.  Data Reviewed: Basic Metabolic Panel:  Lab 06/20/11 9562 06/18/11 0512 06/17/11 1321  NA 140 139 142  K 3.9 3.5 --  CL 106 104 103  CO2 27 25 26   GLUCOSE 100* 112* 110*  BUN 13 21 22   CREATININE 1.00 1.02 1.07  CALCIUM 8.9 8.5 8.9  MG -- -- --  PHOS -- -- --   Liver Function Tests:  Lab 06/17/11 1321    AST 35  ALT 29  ALKPHOS 47  BILITOT 0.5  PROT 6.7  ALBUMIN 3.4*   CBC:  Lab 06/20/11 0535 06/19/11 0500 06/18/11 0512 06/17/11 1321  WBC 3.6* 3.2* 4.4 3.0*  NEUTROABS -- -- -- 1.4*  HGB 12.4* 12.4* 13.0 14.8  HCT 37.7* 36.8* 38.7* 43.9  MCV 85.7 85.2 84.9 86.2  PLT 138* 110* 126* 120*   Cardiac Enzymes:  Lab 06/18/11 1220 06/18/11 0512 06/17/11 2346 06/17/11 1729  CKTOTAL 202 239* 260* 273*  CKMB 6.3* 6.4* 6.4* 6.9*  CKMBINDEX -- -- -- --  TROPONINI <0.30 <0.30 <0.30 <0.30   CBG:  Lab 06/19/11 2144 06/19/11 1719 06/19/11 1145 06/19/11 0830 06/18/11 2202  GLUCAP 101* 111* 121* 122* 94    Recent Results (from the past 240 hour(s))  CULTURE, BLOOD (ROUTINE X 2)     Status: Normal (Preliminary result)   Collection Time   06/17/11  1:10 PM      Component Value Range Status Comment   Specimen Description BLOOD HAND RIGHT   Final    Special Requests     Final    Value: BOTTLES DRAWN AEROBIC AND ANAEROBIC 10CC AER 5CC ANA   Culture  Setup Time [id number]   Final    Culture     Final    Value:        BLOOD CULTURE RECEIVED NO GROWTH TO DATE CULTURE WILL BE  HELD FOR 5 DAYS BEFORE ISSUING A FINAL NEGATIVE REPORT   Report Status PENDING   Incomplete   CULTURE, BLOOD (ROUTINE X 2)     Status: Normal (Preliminary result)   Collection Time   06/17/11  1:20 PM      Component Value Range Status Comment   Specimen Description BLOOD ARM RIGHT   Final    Special Requests BOTTLES DRAWN AEROBIC AND ANAEROBIC 10CC   Final    Culture  Setup Time [id number]   Final    Culture     Final    Value:        BLOOD CULTURE RECEIVED NO GROWTH TO DATE CULTURE WILL BE HELD FOR 5 DAYS BEFORE ISSUING A FINAL NEGATIVE REPORT   Report Status PENDING   Incomplete   MRSA PCR SCREENING     Status: Normal   Collection Time   06/17/11  5:41 PM      Component Value Range Status Comment   MRSA by PCR NEGATIVE  NEGATIVE  Final      Studies: Ct Angio Chest W/cm &/or Wo Cm  06/17/2011  *RADIOLOGY REPORT*   Clinical Data:  Shortness of breath, palpitations, congestion and weakness.  Significant hypoxia.  CT ANGIOGRAPHY CHEST WITH CONTRAST  Technique:  Multidetector CT imaging of the chest was performed using the standard protocol during bolus administration of intravenous contrast.  Multiplanar CT image reconstructions including MIPs were obtained to evaluate the vascular anatomy.  Contrast:  100 ml Omnipaque 350 IV  Comparison:  Chest x-ray earlier today.  Findings:  The pulmonary arteries are adequately opacified.  There is no evidence of acute pulmonary embolism.  The thoracic aorta is of normal caliber.  The heart size is normal status post prior CABG.  There is no evidence of pulmonary edema, consolidation or nodule.  No significant pleural effusions.  Atelectasis present at the right lung base.  No enlarged lymph nodes.  Review of the MIP images confirms the above findings.  IMPRESSION: No evidence of pulmonary embolism.  Right basilar atelectasis.  Original Report Authenticated By: Reola Calkins, M.D.   Dg Chest Port 1 View  06/17/2011  *RADIOLOGY REPORT*  Clinical Data: Shortness of breath, congestion, cough  PORTABLE CHEST - 1 VIEW  Comparison: Chest x-ray of 05/17/2009  Findings: The lungs are not well aerated.  Mild atelectasis is noted at both lung bases.  No definite pneumonia or effusion is seen.  Cardiomegaly is stable.  Median sternotomy sutures are noted from prior CABG.  IMPRESSION: Poor aeration with bibasilar linear atelectasis.  No definite pneumonia or effusion.  Original Report Authenticated By: Juline Patch, M.D.   Scheduled Meds:    . allopurinol  200 mg Oral Daily  . aspirin EC  81 mg Oral Daily  . benazepril  5 mg Oral Daily  . diltiazem  180 mg Oral Daily  . fentaNYL  250 mcg Intravenous Once  . insulin aspart  0-5 Units Subcutaneous QHS  . insulin aspart  0-9 Units Subcutaneous TID WC  . levothyroxine  112 mcg Oral QAC breakfast  . midazolam  10 mg Intravenous Once  .  oseltamivir  75 mg Oral BID  . pantoprazole  40 mg Oral Q1200  . patient's guide to using coumadin book   Does not apply Once  . potassium chloride SA      . potassium chloride  40 mEq Oral BID  . rosuvastatin  40 mg Oral q1800  . sodium chloride  3  mL Intravenous Q12H  . sodium chloride  3 mL Intravenous Q12H  . sodium chloride  3 mL Intravenous Q12H  . tiZANidine  2 mg Oral QHS  . traMADol  50 mg Oral BID  . warfarin  2.5 mg Oral ONCE-1800  . Warfarin - Pharmacist Dosing Inpatient   Does not apply q1800  . DISCONTD: clopidogrel  75 mg Oral Daily  . DISCONTD: diltiazem  60 mg Oral Q6H   Continuous Infusions:    . sodium chloride    . sodium chloride    . diltiazem (CARDIZEM) infusion 15 mg/hr (06/18/11 1135)  . DISCONTD: heparin       Assessment/Plan: 1. Atrial fibrillation with rapid ventricular response: Cardizem and warfarin per cardiology. TSH within normal limits. DCCV today. 2. Acute hypoxic respiratory failure: Resolved. Multifactorial: Atrial fibrillation with rapid ventricular response and influenza B. CT chest negative for pulmonary embolism or acute pulmonary process.  3. Influenza B: Tamiflu. 4. Minimal CK/CK-MB elevation: Likely secondary to strain/demand ischemia.  5. Thrombocytopenia: Stable. Appears to be chronic, intermittent. Seen March 2011, January 2011, December 2010, November 2009.  6. Diabetes mellitus type 2: Stable. Continue sliding scale insulin. Hold metformin while inpatient. Hemoglobin A1c 5.9. 7. Hypothyroidism: Continue replacement therapy. TSH within normal limits. 8. Obstructive sleep apnea: Continue CPAP at night. 9. History of coronary artery disease status post PCI: Per cardiology. Continue aspirin. Cardiology recommended stopping Plavix.  Can transfer to telemetry when okay with cardiology.  Code Status: Full code Family Communication: None at bedside Disposition Plan: Pending further evaluation and treatment.    Brendia Sacks,  MD  [facility] Pager [phone] 06/20/2011, 7:45 AM    LOS: 3 days

## 2011-06-20 NOTE — Procedures (Signed)
Electrical Cardioversion Procedure Note [name] [id number] 1945-06-30  Procedure: Electrical Cardioversion Indications:  Atrial Fibrillation  Procedure Details Consent: Risks of procedure as well as the alternatives and risks of each were explained to the (patient/caregiver).  Consent for procedure obtained. Time Out: Verified patient identification, verified procedure, site/side was marked, verified correct patient position, special equipment/implants available, medications/allergies/relevent history reviewed, required imaging and test results available.  Performed  Patient placed on cardiac monitor, pulse oximetry, supplemental oxygen as necessary.  Sedation given: IV Propofol Pacer pads placed anterior and posterior chest.  Cardioverted 1 time(s).  Cardioverted at 200J.  Evaluation Findings: Post procedure EKG shows: NSR Complications: None Patient did tolerate procedure well.   [name] 06/20/2011, 3:03 PM

## 2011-06-21 LAB — GLUCOSE, CAPILLARY: Glucose-Capillary: 123 mg/dL — ABNORMAL HIGH (ref 70–99)

## 2011-06-21 LAB — BASIC METABOLIC PANEL
BUN: 14 mg/dL (ref 6–23)
Creatinine, Ser: 1.11 mg/dL (ref 0.50–1.35)
GFR calc non Af Amer: 66 mL/min — ABNORMAL LOW (ref >90)
Glucose, Bld: 101 mg/dL — ABNORMAL HIGH (ref 70–99)
Potassium: 5 mEq/L (ref 3.5–5.1)

## 2011-06-21 LAB — CBC
Hemoglobin: 12.4 g/dL — ABNORMAL LOW (ref 13.0–17.0)
MCH: 28.2 pg (ref 26.0–34.0)
MCHC: 32.9 g/dL (ref 30.0–36.0)
RDW: 13.1 % (ref 11.5–15.5)

## 2011-06-21 MED ORDER — OSELTAMIVIR PHOSPHATE 75 MG PO CAPS: 75.0000 mg | ORAL_CAPSULE | Freq: Two times a day (BID) | ORAL | Status: AC

## 2011-06-21 MED ORDER — DILTIAZEM HCL ER COATED BEADS 180 MG PO CP24: 180.0000 mg | ORAL_CAPSULE | Freq: Every day | ORAL | Status: DC

## 2011-06-21 MED ORDER — WARFARIN SODIUM 1 MG PO TABS
1.0000 mg | ORAL_TABLET | ORAL | Status: AC
  Administered 2011-06-21: 1 mg via ORAL
  Filled 2011-06-21: qty 1

## 2011-06-21 NOTE — Progress Notes (Signed)
PROGRESS NOTE  [name] ZOX:[id number] DOB: 03-26-1945 DOA: 06/17/2011 PCP: [name], [name], MD, MD Cardiologist: Arvilla Meres, M.D.  Brief narrative: 66 year old man with history of coronary artery disease status post CABG presented with productive cough and shortness of breath for approximately one week. Found to have atrial fibrillation with rapid ventricular response and hypoxia.  Past medical history: Stroke, diabetes mellitus type 2, hypertension, gout, Obstructive sleep apnea on CPAP/2 L at night, carotid stent  Consultants:  Cardiology  Procedures:  April 3: 2-D echocardiogram: Left ventricular ejection fraction 50-55%. Normal wall motion.  April 5: Transesophageal echocardiogram: No thrombus. Normal systolic function.  April 5: DCCV  Interim History: Interval documentation reviewed. Status post cardioversion.   Subjective: No complaints.  Objective: Filed Vitals:   06/21/11 0000 06/21/11 0008 06/21/11 0311 06/21/11 0401  BP: 121/72  127/67   Pulse:   72   Temp:  97.9 F (36.6 C)  97.5 F (36.4 C)  TempSrc:  Oral  Oral  Resp:      Height:      Weight:      SpO2:  97% 98%     Intake/Output Summary (Last 24 hours) at 06/21/11 0700 Last data filed at 06/21/11 0401  Gross per 24 hour  Intake 560.67 ml  Output    550 ml  Net  10.67 ml    Exam:   General:  Appears calm and comfortable.  Cardiovascular: Regular rate and rhythm. No murmur, rub, gallop. No lower extremity edema.  Respiratory: Clear to auscultation bilaterally. No wheezes, rales, rhonchi. Normal respiratory effort.  Psychiatric: Grossly normal mood and affect. Speech fluent and appropriate.  Data Reviewed: Basic Metabolic Panel:  Lab 06/20/11 8119 06/18/11 0512 06/17/11 1321  NA 140 139 142  K 3.9 3.5 --  CL 106 104 103  CO2 27 25 26   GLUCOSE 100* 112* 110*  BUN 13 21 22   CREATININE 1.00 1.02 1.07  CALCIUM 8.9 8.5 8.9  MG -- -- --  PHOS -- -- --   Liver  Function Tests:  Lab 06/17/11 1321  AST 35  ALT 29  ALKPHOS 47  BILITOT 0.5  PROT 6.7  ALBUMIN 3.4*   CBC:  Lab 06/21/11 0500 06/20/11 0535 06/19/11 0500 06/18/11 0512 06/17/11 1321  WBC 4.7 3.6* 3.2* 4.4 3.0*  NEUTROABS -- -- -- -- 1.4*  HGB 12.4* 12.4* 12.4* 13.0 14.8  HCT 37.7* 37.7* 36.8* 38.7* 43.9  MCV 85.7 85.7 85.2 84.9 86.2  PLT 138* 138* 110* 126* 120*   Cardiac Enzymes:  Lab 06/18/11 1220 06/18/11 0512 06/17/11 2346 06/17/11 1729  CKTOTAL 202 239* 260* 273*  CKMB 6.3* 6.4* 6.4* 6.9*  CKMBINDEX -- -- -- --  TROPONINI <0.30 <0.30 <0.30 <0.30   CBG:  Lab 06/20/11 2119 06/20/11 1703 06/20/11 1238 06/20/11 0825 06/19/11 2144  GLUCAP 99 81 99 92 101*    Recent Results (from the past 240 hour(s))  CULTURE, BLOOD (ROUTINE X 2)     Status: Normal (Preliminary result)   Collection Time   06/17/11  1:10 PM      Component Value Range Status Comment   Specimen Description BLOOD HAND RIGHT   Final    Special Requests     Final    Value: BOTTLES DRAWN AEROBIC AND ANAEROBIC 10CC AER 5CC ANA   Culture  Setup Time [id number]   Final    Culture     Final    Value:        BLOOD  CULTURE RECEIVED NO GROWTH TO DATE CULTURE WILL BE HELD FOR 5 DAYS BEFORE ISSUING A FINAL NEGATIVE REPORT   Report Status PENDING   Incomplete   CULTURE, BLOOD (ROUTINE X 2)     Status: Normal (Preliminary result)   Collection Time   06/17/11  1:20 PM      Component Value Range Status Comment   Specimen Description BLOOD ARM RIGHT   Final    Special Requests BOTTLES DRAWN AEROBIC AND ANAEROBIC 10CC   Final    Culture  Setup Time [id number]   Final    Culture     Final    Value:        BLOOD CULTURE RECEIVED NO GROWTH TO DATE CULTURE WILL BE HELD FOR 5 DAYS BEFORE ISSUING A FINAL NEGATIVE REPORT   Report Status PENDING   Incomplete   MRSA PCR SCREENING     Status: Normal   Collection Time   06/17/11  5:41 PM      Component Value Range Status Comment   MRSA by PCR NEGATIVE  NEGATIVE  Final       Studies: Ct Angio Chest W/cm &/or Wo Cm  06/17/2011  *RADIOLOGY REPORT*  Clinical Data:  Shortness of breath, palpitations, congestion and weakness.  Significant hypoxia.  CT ANGIOGRAPHY CHEST WITH CONTRAST  Technique:  Multidetector CT imaging of the chest was performed using the standard protocol during bolus administration of intravenous contrast.  Multiplanar CT image reconstructions including MIPs were obtained to evaluate the vascular anatomy.  Contrast:  100 ml Omnipaque 350 IV  Comparison:  Chest x-ray earlier today.  Findings:  The pulmonary arteries are adequately opacified.  There is no evidence of acute pulmonary embolism.  The thoracic aorta is of normal caliber.  The heart size is normal status post prior CABG.  There is no evidence of pulmonary edema, consolidation or nodule.  No significant pleural effusions.  Atelectasis present at the right lung base.  No enlarged lymph nodes.  Review of the MIP images confirms the above findings.  IMPRESSION: No evidence of pulmonary embolism.  Right basilar atelectasis.  Original Report Authenticated By: Reola Calkins, M.D.   Dg Chest Port 1 View  06/17/2011  *RADIOLOGY REPORT*  Clinical Data: Shortness of breath, congestion, cough  PORTABLE CHEST - 1 VIEW  Comparison: Chest x-ray of 05/17/2009  Findings: The lungs are not well aerated.  Mild atelectasis is noted at both lung bases.  No definite pneumonia or effusion is seen.  Cardiomegaly is stable.  Median sternotomy sutures are noted from prior CABG.  IMPRESSION: Poor aeration with bibasilar linear atelectasis.  No definite pneumonia or effusion.  Original Report Authenticated By: Juline Patch, M.D.   Scheduled Meds:    . allopurinol  200 mg Oral Daily  . aspirin EC  81 mg Oral Daily  . benazepril  5 mg Oral Daily  . diltiazem  180 mg Oral Daily  . fentaNYL  250 mcg Intravenous Once  . insulin aspart  0-5 Units Subcutaneous QHS  . insulin aspart  0-9 Units Subcutaneous TID WC  .  levothyroxine  112 mcg Oral QAC breakfast  . midazolam  10 mg Intravenous Once  . oseltamivir  75 mg Oral BID  . pantoprazole  40 mg Oral Q1200  . patient's guide to using coumadin book   Does not apply Once  . potassium chloride  40 mEq Oral BID  . rosuvastatin  40 mg Oral q1800  . sodium chloride  3  mL Intravenous Q12H  . sodium chloride  3 mL Intravenous Q12H  . tiZANidine  2 mg Oral QHS  . traMADol  50 mg Oral BID  . Warfarin - Pharmacist Dosing Inpatient   Does not apply q1800  . DISCONTD: sodium chloride  3 mL Intravenous Q12H   Continuous Infusions:    . sodium chloride Stopped (06/20/11 1630)  . DISCONTD: sodium chloride    . DISCONTD: sodium chloride    . DISCONTD: diltiazem (CARDIZEM) infusion 15 mg/hr (06/18/11 1135)     Assessment/Plan: 1. Atrial fibrillation with rapid ventricular response: Cardizem and warfarin per cardiology. TSH within normal limits. Status-post DCCV. INR high after just a few doses of Coumadin. Higher today in the note Coumadin was given yesterday. Will discharge without warfarin and have followup with Coumadin clinic 3 days. At that time INR can be checked and Coumadin dosed accordingly. 2. Acute hypoxic respiratory failure: Resolved. Multifactorial: Atrial fibrillation with rapid ventricular response and influenza B. CT chest negative for pulmonary embolism or acute pulmonary process.  3. Influenza B: Tamiflu. 4. Minimal CK/CK-MB elevation: Likely secondary to strain/demand ischemia.  5. Thrombocytopenia: Stable. Appears to be chronic, intermittent. Seen March 2011, January 2011, December 2010, November 2009.  6. Diabetes mellitus type 2: Stable. Continue sliding scale insulin. Hold metformin while inpatient. Hemoglobin A1c 5.9. 7. Hypothyroidism: Continue replacement therapy. TSH within normal limits. 8. Obstructive sleep apnea: Continue CPAP at night. 9. History of coronary artery disease status post PCI: Per cardiology. Continue aspirin.  Cardiology recommended stopping Plavix.    Code Status: Full code Family Communication: None at bedside Disposition Plan: Home today.   Brendia Sacks, MD  [facility] Pager [phone] 06/21/2011, 7:00 AM    LOS: 4 days

## 2011-06-21 NOTE — Discharge Summary (Signed)
Physician Discharge Summary  [name] HQI:[id number] DOB: 1946-02-01 DOA: 06/17/2011  PCP: [name] Libra, [name], MD, MD  Admit date: 06/17/2011 Discharge date: 06/21/2011 Cardiologist: Arvilla Meres, M.D.  Discharge Diagnoses:  1. Atrial fibrillation with rapid ventricular response 2. Acute hypoxic respiratory failure, resolved 3. Influenza B 4. Thrombocytopenia, stable  Discharge Condition: Improved  Disposition: Home  History of present illness:  66 year old man with history of coronary artery disease status post CABG presented with productive cough and shortness of breath for approximately one week. Found to have atrial fibrillation with rapid ventricular response and hypoxia.  Hospital Course:  [name] was admitted and treated for atrial fibrillation with rapid ventricular response and acute hypoxic respiratory failure. His respiratory failure resolved with control of his rate. He was seen by cardiology and started on warfarin and Plavix was discontinued and aspirin was continued. Cardiology recommended cardioversion which was accomplished and the patient has remained in sinus rhythm. He was diagnosed with acute influenza B which likely precipitated his hypoxic respiratory failure and atrial fibrillation. He will complete a course of Tamiflu. These and other issues as outlined below. 1. Atrial fibrillation with rapid ventricular response: Cardizem and warfarin per cardiology. TSH within normal limits. Status-post DCCV. INR high after just a few doses of Coumadin. Higher today even though Coumadin was not given yesterday. Will discharge without warfarin and have followup with Coumadin clinic 3 days. At that time INR can be checked and Coumadin dosed accordingly.  2. Acute hypoxic respiratory failure: Resolved. Multifactorial: Atrial fibrillation with rapid ventricular response and influenza B. CT chest negative for pulmonary embolism or acute pulmonary process.    3. Influenza  B: Tamiflu.  4. Minimal CK/CK-MB elevation: Likely secondary to strain/demand ischemia.    5. Thrombocytopenia: Stable. Appears to be chronic, intermittent. Seen March 2011, January 2011, December 2010, November 2009.    6. Diabetes mellitus type 2: Stable. Continue sliding scale insulin. Hold metformin while inpatient. Hemoglobin A1c 5.9.  7. Hypothyroidism: Continue replacement therapy. TSH within normal limits.  8. Obstructive sleep apnea: Continue CPAP at night.  9. History of coronary artery disease status post PCI: Per cardiology. Continue aspirin. Cardiology recommended stopping Plavix.  Consultants:  Cardiology  Procedures:  April 3: 2-D echocardiogram: Left ventricular ejection fraction 50-55%. Normal wall motion.   April 5: Transesophageal echocardiogram: No thrombus. Normal systolic function.   April 5: DCCV  Discharge Instructions  Discharge Orders    Future Appointments: Provider: Department: Dept Phone: Center:   07/21/2011 9:30 AM [name] Colace, MD [facility] [phone] None   07/28/2011 8:45 AM [name] Perfect, MD [facility] [phone] [facility]     Future Orders Please Complete By Expires   Diet - low sodium heart healthy      Diet Carb Modified      Activity as tolerated - No restrictions        Medication List  As of 06/21/2011  2:30 PM   STOP taking these medications         clopidogrel 75 MG tablet      doxycycline 100 MG tablet      metoprolol succinate 25 MG 24 hr tablet         TAKE these medications         allopurinol 100 MG tablet   Commonly known as: ZYLOPRIM   Take 200 mg by mouth daily. Take 2 tablets by mouth once a day.      aspirin 81 MG tablet  Take 81 mg by mouth daily.      benazepril 5 MG tablet   Commonly known as: LOTENSIN   Take 5 mg by mouth daily.      diltiazem 180 MG 24 hr capsule   Commonly known as: CARDIZEM CD   Take 1 capsule (180 mg total) by mouth daily.      furosemide 40 MG tablet    Commonly known as: LASIX   Take 40 mg by mouth daily as needed. For fluid retention      levothyroxine 112 MCG tablet   Commonly known as: SYNTHROID, LEVOTHROID   Take 112 mcg by mouth daily.      metFORMIN 500 MG tablet   Commonly known as: GLUCOPHAGE   Take 500 mg by mouth daily with breakfast.      nitroGLYCERIN 0.4 MG SL tablet   Commonly known as: NITROSTAT   Place 0.4 mg under the tongue every 5 (five) minutes as needed. For chest pain Up to 3 doses      NON FORMULARY   2 L/M for sleep      oseltamivir 75 MG capsule   Commonly known as: TAMIFLU   Take 1 capsule (75 mg total) by mouth 2 (two) times daily. Start evening 4/6.      pantoprazole 40 MG tablet   Commonly known as: PROTONIX   Take 40 mg by mouth daily.      rosuvastatin 40 MG tablet   Commonly known as: CRESTOR   Take 40 mg by mouth daily.      tiZANidine 2 MG tablet   Commonly known as: ZANAFLEX   Take 1 by mouth at bedtime.      traMADol 100 MG 24 hr tablet   Commonly known as: ULTRAM-ER   Take 100 mg by mouth every morning.      traMADol 50 MG tablet   Commonly known as: ULTRAM   Take 50 mg by mouth 2 (two) times daily.           Follow-up Information    Follow up with [name] Libra, [name], MD in 2 weeks.      Follow up with Arvilla Meres, MD in 2 weeks.   Contact information:   [address] [place] [address] [phone]       Follow up with [facility]. Schedule an appointment as soon as possible for a visit in 2 days. ([facility])          The results of significant diagnostics from this hospitalization (including imaging, microbiology, ancillary and laboratory) are listed below for reference.    Significant Diagnostic Studies: Ct Angio Chest W/cm &/or Wo Cm  06/17/2011  *RADIOLOGY REPORT*  Clinical Data:  Shortness of breath, palpitations, congestion and weakness.  Significant hypoxia.  CT ANGIOGRAPHY CHEST  WITH CONTRAST  Technique:  Multidetector CT imaging of the chest was performed using the standard protocol during bolus administration of intravenous contrast.  Multiplanar CT image reconstructions including MIPs were obtained to evaluate the vascular anatomy.  Contrast:  100 ml Omnipaque 350 IV  Comparison:  Chest x-ray earlier today.  Findings:  The pulmonary arteries are adequately opacified.  There is no evidence of acute pulmonary embolism.  The thoracic aorta is of normal caliber.  The heart size is normal status post prior CABG.  There is no evidence of pulmonary edema, consolidation or nodule.  No significant pleural effusions.  Atelectasis present at the right lung  base.  No enlarged lymph nodes.  Review of the MIP images confirms the above findings.  IMPRESSION: No evidence of pulmonary embolism.  Right basilar atelectasis.  Original Report Authenticated By: Reola Calkins, M.D.   Dg Chest Port 1 View  06/17/2011  *RADIOLOGY REPORT*  Clinical Data: Shortness of breath, congestion, cough  PORTABLE CHEST - 1 VIEW  Comparison: Chest x-ray of 05/17/2009  Findings: The lungs are not well aerated.  Mild atelectasis is noted at both lung bases.  No definite pneumonia or effusion is seen.  Cardiomegaly is stable.  Median sternotomy sutures are noted from prior CABG.  IMPRESSION: Poor aeration with bibasilar linear atelectasis.  No definite pneumonia or effusion.  Original Report Authenticated By: Juline Patch, M.D.    Microbiology: Recent Results (from the past 240 hour(s))  CULTURE, BLOOD (ROUTINE X 2)     Status: Normal (Preliminary result)   Collection Time   06/17/11  1:10 PM      Component Value Range Status Comment   Specimen Description BLOOD HAND RIGHT   Final    Special Requests     Final    Value: BOTTLES DRAWN AEROBIC AND ANAEROBIC 10CC AER 5CC ANA   Culture  Setup Time [id number]   Final    Culture     Final    Value:        BLOOD CULTURE RECEIVED NO GROWTH TO DATE CULTURE WILL BE  HELD FOR 5 DAYS BEFORE ISSUING A FINAL NEGATIVE REPORT   Report Status PENDING   Incomplete   CULTURE, BLOOD (ROUTINE X 2)     Status: Normal (Preliminary result)   Collection Time   06/17/11  1:20 PM      Component Value Range Status Comment   Specimen Description BLOOD ARM RIGHT   Final    Special Requests BOTTLES DRAWN AEROBIC AND ANAEROBIC 10CC   Final    Culture  Setup Time [id number]   Final    Culture     Final    Value:        BLOOD CULTURE RECEIVED NO GROWTH TO DATE CULTURE WILL BE HELD FOR 5 DAYS BEFORE ISSUING A FINAL NEGATIVE REPORT   Report Status PENDING   Incomplete   MRSA PCR SCREENING     Status: Normal   Collection Time   06/17/11  5:41 PM      Component Value Range Status Comment   MRSA by PCR NEGATIVE  NEGATIVE  Final      Labs: Basic Metabolic Panel:  Lab 06/21/11 7846 06/20/11 0535 06/18/11 0512 06/17/11 1321  NA 140 140 139 142  K 5.0 3.9 -- --  CL 107 106 104 103  CO2 27 27 25 26   GLUCOSE 101* 100* 112* 110*  BUN 14 13 21 22   CREATININE 1.11 1.00 1.02 1.07  CALCIUM 9.2 8.9 8.5 8.9  MG -- -- -- --  PHOS -- -- -- --   Liver Function Tests:  Lab 06/17/11 1321  AST 35  ALT 29  ALKPHOS 47  BILITOT 0.5  PROT 6.7  ALBUMIN 3.4*   CBC:  Lab 06/21/11 0500 06/20/11 0535 06/19/11 0500 06/18/11 0512 06/17/11 1321  WBC 4.7 3.6* 3.2* 4.4 3.0*  NEUTROABS -- -- -- -- 1.4*  HGB 12.4* 12.4* 12.4* 13.0 14.8  HCT 37.7* 37.7* 36.8* 38.7* 43.9  MCV 85.7 85.7 85.2 84.9 86.2  PLT 138* 138* 110* 126* 120*   Cardiac Enzymes:  Lab 06/18/11 1220 06/18/11 0512 06/17/11 2346  06/17/11 1729  CKTOTAL 202 239* 260* 273*  CKMB 6.3* 6.4* 6.4* 6.9*  CKMBINDEX -- -- -- --  TROPONINI <0.30 <0.30 <0.30 <0.30   CBG:  Lab 06/21/11 1208 06/21/11 0803 06/20/11 2119 06/20/11 1703 06/20/11 1238  GLUCAP 123* 91 99 81 99    Time coordinating discharge: 25 minutes.  Signed:  Brendia Sacks, MD  [facility] 06/21/2011, 2:30 PM

## 2011-06-21 NOTE — Progress Notes (Signed)
ANTICOAGULATION CONSULT NOTE - Follow Up Consult  Pharmacy Consult for Coumadin Indication: atrial fibrillation  No Known Allergies  Vital Signs: Temp: 97.5 F (36.4 C) (04/06 0804) Temp src: Oral (04/06 0804) BP: 126/70 mmHg (04/06 0803) Pulse Rate: 57  (04/06 0803)  Labs:  Basename 06/21/11 0500 06/20/11 0535 06/19/11 0500 06/18/11 1220  HGB 12.4* 12.4* -- --  HCT 37.7* 37.7* 36.8* --  PLT 138* 138* 110* --  APTT -- -- -- --  LABPROT 33.7* 32.6* 23.3* --  INR 3.26* 3.12* 2.03* --  HEPARINUNFRC -- -- 0.93* --  CREATININE 1.11 1.00 -- --  CKTOTAL -- -- -- 202  CKMB -- -- -- 6.3*  TROPONINI -- -- -- <0.30   Estimated Creatinine Clearance: 85.9 ml/min (by C-G formula based on Cr of 1.11).  Assessment: 66 yo male with new onset afib for anticoagulation.  Large increase in INR on 4/4 to 4/5. INR today remains supratherapeutic at 3.26 (increased from yesterday's INR of 3.12).  CBC stable, no bleeding noted.  Goal of Therapy:  INR 2-3   Plan:  1. No Coumadin today 2. F/u AM INR and labs  Concha Norway 06/21/2011,10:49 AM

## 2011-06-21 NOTE — Progress Notes (Signed)
Patient ID: [name], male   DOB: [birth year], 66 y.o.   MRN: [id number]     SUBJECTIVE: Remains in NSR this am, no complaints.     [name] allopurinol  200 mg Oral Daily  . aspirin EC  81 mg Oral Daily  . benazepril  5 mg Oral Daily  . diltiazem  180 mg Oral Daily  . fentaNYL  250 mcg Intravenous Once  . insulin aspart  0-5 Units Subcutaneous QHS  . insulin aspart  0-9 Units Subcutaneous TID WC  . levothyroxine  112 mcg Oral QAC breakfast  . midazolam  10 mg Intravenous Once  . oseltamivir  75 mg Oral BID  . pantoprazole  40 mg Oral Q1200  . patient's guide to using coumadin book   Does not apply Once  . rosuvastatin  40 mg Oral q1800  . sodium chloride  3 mL Intravenous Q12H  . sodium chloride  3 mL Intravenous Q12H  . tiZANidine  2 mg Oral QHS  . traMADol  50 mg Oral BID  . Warfarin - Pharmacist Dosing Inpatient   Does not apply q1800  . DISCONTD: potassium chloride  40 mEq Oral BID  . DISCONTD: sodium chloride  3 mL Intravenous Q12H     Filed Vitals:   06/21/11 0803 06/21/11 0804 06/21/11 1045 06/21/11 1206  BP: 126/70  130/52   Pulse: 57     Temp:  97.5 F (36.4 C)  98.1 F (36.7 C)  TempSrc:  Oral  Oral  Resp: 16 18  18   Height:      Weight:      SpO2: 97%   96%    Intake/Output Summary (Last 24 hours) at 06/21/11 1243 Last data filed at 06/21/11 1054  Gross per 24 hour  Intake 803.67 ml  Output    550 ml  Net 253.67 ml    LABS: Basic Metabolic Panel:  Basename 06/21/11 0500 06/20/11 0535  NA 140 140  K 5.0 3.9  CL 107 106  CO2 27 27  GLUCOSE 101* 100*  BUN 14 13  CREATININE 1.11 1.00  CALCIUM 9.2 8.9  MG -- --  PHOS -- --   Liver Function Tests: No results found for this basename: AST:2,ALT:2,ALKPHOS:2,BILITOT:2,PROT:2,ALBUMIN:2 in the last 72 hours No results found for this basename: LIPASE:2,AMYLASE:2 in the last 72 hours CBC:  Basename 06/21/11 0500 06/20/11 0535  WBC 4.7 3.6*  NEUTROABS -- --  HGB 12.4* 12.4*  HCT 37.7* 37.7*  MCV  85.7 85.7  PLT 138* 138*   Cardiac Enzymes: No results found for this basename: CKTOTAL:3,CKMB:3,CKMBINDEX:3,TROPONINI:3 in the last 72 hours BNP: No components found with this basename: POCBNP:3 D-Dimer: No results found for this basename: DDIMER:2 in the last 72 hours Hemoglobin A1C: No results found for this basename: HGBA1C in the last 72 hours Fasting Lipid Panel: No results found for this basename: CHOL,HDL,LDLCALC,TRIG,CHOLHDL,LDLDIRECT in the last 72 hours Thyroid Function Tests: No results found for this basename: TSH,T4TOTAL,FREET3,T3FREE,THYROIDAB in the last 72 hours Anemia Panel: No results found for this basename: VITAMINB12,FOLATE,FERRITIN,TIBC,IRON,RETICCTPCT in the last 72 hours  RADIOLOGY: Ct Angio Chest W/cm &/or Wo Cm  06/17/2011  *RADIOLOGY REPORT*  Clinical Data:  Shortness of breath, palpitations, congestion and weakness.  Significant hypoxia.  CT ANGIOGRAPHY CHEST WITH CONTRAST  Technique:  Multidetector CT imaging of the chest was performed using the standard protocol during bolus administration of intravenous contrast.  Multiplanar CT image reconstructions including MIPs were obtained to evaluate the vascular anatomy.  Contrast:  100 ml Omnipaque 350 IV  Comparison:  Chest x-ray earlier today.  Findings:  The pulmonary arteries are adequately opacified.  There is no evidence of acute pulmonary embolism.  The thoracic aorta is of normal caliber.  The heart size is normal status post prior CABG.  There is no evidence of pulmonary edema, consolidation or nodule.  No significant pleural effusions.  Atelectasis present at the right lung base.  No enlarged lymph nodes.  Review of the MIP images confirms the above findings.  IMPRESSION: No evidence of pulmonary embolism.  Right basilar atelectasis.  Original Report Authenticated By: Reola Calkins, M.D.   Dg Chest Port 1 View  06/17/2011  *RADIOLOGY REPORT*  Clinical Data: Shortness of breath, congestion, cough  PORTABLE  CHEST - 1 VIEW  Comparison: Chest x-ray of 05/17/2009  Findings: The lungs are not well aerated.  Mild atelectasis is noted at both lung bases.  No definite pneumonia or effusion is seen.  Cardiomegaly is stable.  Median sternotomy sutures are noted from prior CABG.  IMPRESSION: Poor aeration with bibasilar linear atelectasis.  No definite pneumonia or effusion.  Original Report Authenticated By: Juline Patch, M.D.    PHYSICAL EXAM General: NAD Neck: JVP 7-8 cm, no thyromegaly or thyroid nodule.  Lungs: Clear to auscultation bilaterally with normal respiratory effort. CV: Nondisplaced PMI.  Heart irregular S1/S2, no S3/S4, no murmur.  No peripheral edema.  No carotid bruit.  Abdomen: Soft, nontender, no hepatosplenomegaly, no distention.  Neurologic: Alert and oriented x 3.  Psych: Normal affect. Extremities: No clubbing or cyanosis.   TELEMETRY: Reviewed telemetry pt in atrial fibrillation, rate 50s-60s  ASSESSMENT AND PLAN:  66 yo admitted with influenza B who also developed atrial fibrillation with RVR in the setting of the flu.  1. Atrial fibrillation: Now s/p DCCV.  He remains in NSR.  Continue coumadin and diltiazem CD.  If he is discharged today, will need followup with [place] [facility] clinic.  2. CAD: Stable with no chest pain.  Cardiac enzymes negative.  Plavix stopped since we have started coumadin (PCI in 1/11).  3. Influenza: Per hospitalist, he is on oseltamivir.   [name] 06/21/2011 12:43 PM

## 2011-06-23 ENCOUNTER — Encounter (HOSPITAL_COMMUNITY): Payer: Self-pay | Admitting: Cardiology

## 2011-06-23 LAB — CULTURE, BLOOD (ROUTINE X 2)
Culture  Setup Time: 201304022047
Culture  Setup Time: 201304022047
Culture: NO GROWTH

## 2011-06-24 ENCOUNTER — Ambulatory Visit (INDEPENDENT_AMBULATORY_CARE_PROVIDER_SITE_OTHER): Payer: Medicare Other | Admitting: *Deleted

## 2011-06-24 DIAGNOSIS — I4891 Unspecified atrial fibrillation: Secondary | ICD-10-CM

## 2011-06-24 DIAGNOSIS — Z5181 Encounter for therapeutic drug level monitoring: Secondary | ICD-10-CM | POA: Insufficient documentation

## 2011-06-24 DIAGNOSIS — Z7901 Long term (current) use of anticoagulants: Secondary | ICD-10-CM

## 2011-06-24 HISTORY — DX: Long term (current) use of anticoagulants: Z79.01

## 2011-06-24 LAB — POCT INR: INR: 2

## 2011-06-24 MED ORDER — WARFARIN SODIUM 2.5 MG PO TABS: 2.5000 mg | ORAL_TABLET | ORAL | Status: DC

## 2011-06-24 NOTE — Patient Instructions (Addendum)

## 2011-07-03 ENCOUNTER — Ambulatory Visit (INDEPENDENT_AMBULATORY_CARE_PROVIDER_SITE_OTHER): Payer: Medicare Other

## 2011-07-03 ENCOUNTER — Ambulatory Visit (INDEPENDENT_AMBULATORY_CARE_PROVIDER_SITE_OTHER): Payer: Medicare Other | Admitting: Cardiology

## 2011-07-03 ENCOUNTER — Encounter: Payer: Self-pay | Admitting: Cardiology

## 2011-07-03 VITALS — BP 132/82 | HR 88 | Ht 75.0 in | Wt 257.0 lb

## 2011-07-03 DIAGNOSIS — I4891 Unspecified atrial fibrillation: Secondary | ICD-10-CM

## 2011-07-03 DIAGNOSIS — Z7901 Long term (current) use of anticoagulants: Secondary | ICD-10-CM | POA: Diagnosis not present

## 2011-07-03 DIAGNOSIS — I2510 Atherosclerotic heart disease of native coronary artery without angina pectoris: Secondary | ICD-10-CM

## 2011-07-03 DIAGNOSIS — E785 Hyperlipidemia, unspecified: Secondary | ICD-10-CM | POA: Diagnosis not present

## 2011-07-03 NOTE — Patient Instructions (Signed)
Your physician recommends that you schedule a follow-up appointment in: 4 months with Dr McLean.  

## 2011-07-06 NOTE — Progress Notes (Signed)
PCP: Dr. Rodena Medin  66 yo with history of CAD s/p CABG and paroxysmal atrial fibrillation was admitted in 4/13 with influenza and atrial fibrillation with RVR.  [name] has been seen by Dr. Gala Romney in the past but will followup with me after Dr. [name] transition to CHF clinic.  Patient felt "sick" and short of breath so came to the ER.  He was noted to be in atrial fibrillation with RVR.  He tested positive for influenza B.  CTA chest was negative for PE.  Plavix was stopped and coumadin was started.  He had a TEE-guided cardioversion back to NSR and remains in NSR today.  He still feels somewhat weak but is getting better.  No chest pain.  No exertional dyspnea.  No further tachypalpitations.    ECG: NSR, LVH  Labs (4/13): K 5, creatinine 1.11, LDL 32, HDL 30  PMH: 1. GERD 2. Gout 3. Hypothyroidism 4. HTN 5. H/o CVA: some residual left-sided weakness.  6. Borderline DM2 7. CAD s/p CABG in 2004.  LHC in 2011: SVG-RCA and SVG-OM totally occluded.  LIMA-LAD patent.  Patient had DES to CFX, flow down native RCA was ok.  8. OSA on CPAP 9. Paroxysmal atrial fibrillation. TEE (4/13) with EF 55-60%, no significant valvular abnormalities.   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  Nonsmoker.  Lives in [name]    FH: No premature CAD.   ROS: All systems reviewed and negative except as per HPI.   Current Outpatient Prescriptions  Medication Sig Dispense Refill  . allopurinol (ZYLOPRIM) 100 MG tablet Take 200 mg by mouth daily. Take 2 tablets by mouth once a day.      Marland Kitchen aspirin 81 MG tablet Take 81 mg by mouth daily.        . benazepril (LOTENSIN) 5 MG tablet Take 5 mg by mouth daily.      Marland Kitchen diltiazem (CARDIZEM CD) 180 MG 24 hr capsule Take 1 capsule (180 mg total) by mouth daily.  30 capsule  0  . furosemide (LASIX) 40 MG tablet Take 40 mg by mouth daily as needed. For fluid retention      . levothyroxine (SYNTHROID, LEVOTHROID) 112 MCG tablet Take 112  mcg by mouth daily.      . metFORMIN (GLUCOPHAGE) 500 MG tablet Take 500 mg by mouth daily with breakfast.      . nitroGLYCERIN (NITROSTAT) 0.4 MG SL tablet Place 0.4 mg under the tongue every 5 (five) minutes as needed. For chest pain Up to 3 doses      . NON FORMULARY 2 L/M for sleep       . pantoprazole (PROTONIX) 40 MG tablet Take 40 mg by mouth daily.      . rosuvastatin (CRESTOR) 40 MG tablet Take 40 mg by mouth daily.      Marland Kitchen TAMIFLU 75 MG capsule as directed.      Marland Kitchen tiZANidine (ZANAFLEX) 2 MG tablet Take 1 by mouth at bedtime.       . traMADol (ULTRAM) 50 MG tablet Take 50 mg by mouth 2 (two) times daily.       . traMADol (ULTRAM-ER) 100 MG 24 hr tablet Take 100 mg by mouth every morning.        . warfarin (COUMADIN) 2.5 MG tablet Take 1 tablet (2.5 mg total) by mouth as directed. Take coumadin as directed by coumadin clinic  35 tablet  1  . DISCONTD: metFORMIN (GLUCOPHAGE) 500 MG tablet Take 1  tablet (500 mg total) by mouth daily with breakfast.  30 tablet  6    BP 132/82  Pulse 88  Ht 6\' 3"  (1.905 m)  Wt 257 lb (116.574 kg)  BMI 32.12 kg/m2  SpO2 96% General: NAD, overweight Neck: No JVD, no thyromegaly or thyroid nodule.  Lungs: Clear to auscultation bilaterally with normal respiratory effort. CV: Nondisplaced PMI.  Heart regular S1/S2, no S3/S4, no murmur.  No peripheral edema.  No carotid bruit.  Normal pedal pulses.  Abdomen: Soft, nontender, no hepatosplenomegaly, no distention.  Neurologic: Alert and oriented x 3.  Psych: Normal affect. Extremities: No clubbing or cyanosis.

## 2011-07-07 NOTE — Assessment & Plan Note (Signed)
LDL at goal (< 70) when recently checked.  

## 2011-07-07 NOTE — Assessment & Plan Note (Signed)
No ischemic symptoms.  Plavix stopped because patient was started on coumadin.

## 2011-07-07 NOTE — Assessment & Plan Note (Signed)
Paroxysmal atrial fibrillation.  Patient had 1 episode prior to 4/13.  This current episode was likely triggered by acute illness (flu).  He is in NSR today.  Continue diltiazem CD and warfarin.

## 2011-07-08 ENCOUNTER — Ambulatory Visit (INDEPENDENT_AMBULATORY_CARE_PROVIDER_SITE_OTHER): Payer: Medicare Other | Admitting: Internal Medicine

## 2011-07-08 ENCOUNTER — Encounter: Payer: Self-pay | Admitting: Internal Medicine

## 2011-07-08 VITALS — BP 130/90 | HR 82 | Temp 98.0°F | Wt 263.0 lb

## 2011-07-08 DIAGNOSIS — I4891 Unspecified atrial fibrillation: Secondary | ICD-10-CM | POA: Diagnosis not present

## 2011-07-08 DIAGNOSIS — E291 Testicular hypofunction: Secondary | ICD-10-CM | POA: Diagnosis not present

## 2011-07-08 DIAGNOSIS — I10 Essential (primary) hypertension: Secondary | ICD-10-CM | POA: Diagnosis not present

## 2011-07-08 MED ORDER — TESTOSTERONE 2 MG/24HR TD PT24: 1.0000 | MEDICATED_PATCH | Freq: Every day | TRANSDERMAL | Status: DC

## 2011-07-08 NOTE — Progress Notes (Signed)
  Subjective:    Patient ID: [name], male    DOB: 07-Mar-1946, 66 y.o.   MRN: [id number]  HPI Pt presents to clinic for HFU of afib and influenza infxn. Recently hospitalized with URI with associated hypoxia and afib rvr. tx'ed with tamiflu. Chest ct showed no definite infiltrate only mild atelectasis. Respiratory sx's resolved. Noted with new onset afib and beta blocker changed to cardizem. Notes bp not minimally elevated. No palpitations. S/p cardioversion. Maintained on coumadin under direction of cardiology without gross active bleeding. H/o hypogonadism but could not tolerate androgel 4 or 2 pumps. Interested in other preparations.   Past Medical History  Diagnosis Date  . GERD (gastroesophageal reflux disease)   . Gout   . Hyperlipidemia   . Hypertension   . CVA (cerebral infarction)     left hemiparesis  . Chronic pain     left sided-Kristeins  . CAD (coronary artery disease)     s/p CABG, s/p DES to LCX  January 2011  . OSA on CPAP   . Sleep apnea   . History of colonoscopy    Past Surgical History  Procedure Date  . Coronary artery bypass graft     stent  . Esophagogastroduodenoscopy 03-25-2005  . Nasal septum surgery   . Percutaneous placement intravascular stent cervical carotid artery     03-2009; using a drug-eluting platform of the circumflex cornoray artery with a 3.0 x 18 [employer] Promus drug-eluting platform post dilated to 3.75 with a noncompliant balloon.  . Cataract extraction 05/2010    left eye  . Cataract extraction 04/2010    right eye  . Cardioversion 06/20/2011    Procedure: CARDIOVERSION;  Surgeon: Laurey Morale, MD;  Location: [facility] ENDOSCOPY;  Service: Cardiovascular;  Laterality: N/A;  . Tee without cardioversion 06/20/2011    Procedure: TRANSESOPHAGEAL ECHOCARDIOGRAM (TEE);  Surgeon: Laurey Morale, MD;  Location: [facility] ENDOSCOPY;  Service: Cardiovascular;  Laterality: N/A;    reports that he has never smoked. He has never used smokeless  tobacco. He reports that he does not drink alcohol or use illicit drugs. family history includes Lung cancer in his father and Stroke in his mother. No Known Allergies   Review of Systems see hpi     Objective:   Physical Exam  Physical Exam  Nursing note and vitals reviewed. Constitutional: Appears well-developed and well-nourished. No distress.  HENT:  Head: Normocephalic and atraumatic.  Right Ear: External ear normal.  Left Ear: External ear normal.  Eyes: Conjunctivae are normal. No scleral icterus.  Neck: Neck supple. Carotid bruit is not present.  Cardiovascular: Normal rate, regular rhythm and normal heart sounds.  Exam reveals no gallop and no friction rub.   No murmur heard. Pulmonary/Chest: Effort normal and breath sounds normal. No respiratory distress. He has no wheezes. no rales.  Lymphadenopathy:    He has no cervical adenopathy.  Neurological:Alert.  Skin: Skin is warm and dry. Not diaphoretic.  Psychiatric: Has a normal mood and affect.        Assessment & Plan:

## 2011-07-08 NOTE — Assessment & Plan Note (Signed)
Regular hr on exam s/p cardioversion. Explained rationale behind anticoagulation and hr reduction medication. F/u with cardiology

## 2011-07-08 NOTE — Assessment & Plan Note (Signed)
Minimal elevation. Discussed cardizem titration and pt currently defers. Recommend outpt bp log.

## 2011-07-08 NOTE — Assessment & Plan Note (Signed)
Attempt low dose androderm patch. If tolerates then f/u with testosterone, psa and lft

## 2011-07-10 ENCOUNTER — Ambulatory Visit (INDEPENDENT_AMBULATORY_CARE_PROVIDER_SITE_OTHER): Payer: Medicare Other | Admitting: Pharmacist

## 2011-07-10 DIAGNOSIS — Z7901 Long term (current) use of anticoagulants: Secondary | ICD-10-CM | POA: Diagnosis not present

## 2011-07-10 DIAGNOSIS — I4891 Unspecified atrial fibrillation: Secondary | ICD-10-CM | POA: Diagnosis not present

## 2011-07-17 ENCOUNTER — Ambulatory Visit (INDEPENDENT_AMBULATORY_CARE_PROVIDER_SITE_OTHER): Payer: Medicare Other | Admitting: *Deleted

## 2011-07-17 DIAGNOSIS — Z7901 Long term (current) use of anticoagulants: Secondary | ICD-10-CM | POA: Diagnosis not present

## 2011-07-17 DIAGNOSIS — L608 Other nail disorders: Secondary | ICD-10-CM | POA: Diagnosis not present

## 2011-07-17 DIAGNOSIS — I4891 Unspecified atrial fibrillation: Secondary | ICD-10-CM | POA: Diagnosis not present

## 2011-07-17 DIAGNOSIS — I739 Peripheral vascular disease, unspecified: Secondary | ICD-10-CM | POA: Diagnosis not present

## 2011-07-17 DIAGNOSIS — L84 Corns and callosities: Secondary | ICD-10-CM | POA: Diagnosis not present

## 2011-07-17 LAB — POCT INR: INR: 2.7

## 2011-07-21 ENCOUNTER — Ambulatory Visit (HOSPITAL_BASED_OUTPATIENT_CLINIC_OR_DEPARTMENT_OTHER): Payer: Medicare Other | Admitting: Physical Medicine & Rehabilitation

## 2011-07-21 ENCOUNTER — Other Ambulatory Visit: Payer: Self-pay | Admitting: Internal Medicine

## 2011-07-21 ENCOUNTER — Encounter: Payer: Medicare Other | Attending: Physical Medicine & Rehabilitation

## 2011-07-21 ENCOUNTER — Encounter: Payer: Self-pay | Admitting: Physical Medicine & Rehabilitation

## 2011-07-21 VITALS — BP 143/71 | HR 72 | Resp 16 | Ht 75.0 in | Wt 264.8 lb

## 2011-07-21 DIAGNOSIS — Z961 Presence of intraocular lens: Secondary | ICD-10-CM | POA: Diagnosis not present

## 2011-07-21 DIAGNOSIS — N429 Disorder of prostate, unspecified: Secondary | ICD-10-CM

## 2011-07-21 DIAGNOSIS — I69959 Hemiplegia and hemiparesis following unspecified cerebrovascular disease affecting unspecified side: Secondary | ICD-10-CM | POA: Insufficient documentation

## 2011-07-21 DIAGNOSIS — R209 Unspecified disturbances of skin sensation: Secondary | ICD-10-CM | POA: Diagnosis not present

## 2011-07-21 DIAGNOSIS — E119 Type 2 diabetes mellitus without complications: Secondary | ICD-10-CM | POA: Diagnosis not present

## 2011-07-21 DIAGNOSIS — R42 Dizziness and giddiness: Secondary | ICD-10-CM | POA: Insufficient documentation

## 2011-07-21 DIAGNOSIS — H43819 Vitreous degeneration, unspecified eye: Secondary | ICD-10-CM | POA: Diagnosis not present

## 2011-07-21 DIAGNOSIS — G8110 Spastic hemiplegia affecting unspecified side: Secondary | ICD-10-CM | POA: Insufficient documentation

## 2011-07-21 DIAGNOSIS — H43399 Other vitreous opacities, unspecified eye: Secondary | ICD-10-CM | POA: Diagnosis not present

## 2011-07-21 DIAGNOSIS — H4310 Vitreous hemorrhage, unspecified eye: Secondary | ICD-10-CM | POA: Diagnosis not present

## 2011-07-21 LAB — BASIC METABOLIC PANEL
CO2: 29 mEq/L (ref 19–32)
Chloride: 103 mEq/L (ref 96–112)
Glucose, Bld: 105 mg/dL — ABNORMAL HIGH (ref 70–99)

## 2011-07-21 LAB — HEMOGLOBIN A1C
Hgb A1c MFr Bld: 6.2 % — ABNORMAL HIGH (ref <5.7)
Mean Plasma Glucose: 131 mg/dL — ABNORMAL HIGH (ref <117)

## 2011-07-21 NOTE — Patient Instructions (Signed)
Botox Cosmetic Injections  Care After  Refer to this sheet in the next few weeks. These instructions provide you with information on caring for yourself after your procedure. Your caregiver may also give you more specific instructions. Your treatment has been planned according to current medical practices, but problems sometimes occur. Call your caregiver if you have any problems or questions after your procedure.  HOME CARE INSTRUCTIONS     Do not lie down for 4 hours after treatment.   Do not massage the treated muscles. This can cause the Botox to spread to the muscles around the eyes, which can lead to double vision.   Exercise the muscles every 15 minutes for 1 hour after treatment or as directed by your caregiver. Botox attaches better to active muscles.   You may put ice on the area where the shot was given.   Put ice in a plastic bag.   Place a towel between your skin and the bag.  SEEK IMMEDIATE MEDICAL CARE IF:     You develop double vision.   You start to have trouble swallowing.   You develop muscle weakness in other parts of your body.   Your pupils start to become dilated and sensitive to light.  MAKE SURE YOU:   Understand these instructions.   Will watch your condition.   Will get help right away if you are not doing well or get worse.  Document Released: 08/05/2010 Document Revised: 02/20/2011 Document Reviewed: 08/05/2010  ExitCare Patient Information 2012 [employer].

## 2011-07-21 NOTE — Progress Notes (Signed)
Botox Injection for spasticity using needle EMG guidance  Dilution: 50 Units/ml Indication: Severe spasticity which interferes with ADL,mobility and/or  hygiene and is unresponsive to medication management and other conservative care Informed consent was obtained after describing risks and benefits of the procedure with the patient. This includes bleeding, bruising, infection, excessive weakness, or medication side effects. A REMS form is on file and signed. Needle: 26g,2" needle electrode Number of units per muscle Pectoralis0 Biceps0 FCR50 FCU0 FDS50 FDP0 FPL25 Gastrosoleus0 Hamstrings150 PT 25 All injections were done after obtaining appropriate EMG activity and after negative drawback for blood. The patient tolerated the procedure well. Post procedure instructions were given. A followup appointment was made.

## 2011-07-22 ENCOUNTER — Telehealth: Payer: Self-pay | Admitting: Internal Medicine

## 2011-07-22 DIAGNOSIS — H43819 Vitreous degeneration, unspecified eye: Secondary | ICD-10-CM | POA: Diagnosis not present

## 2011-07-22 DIAGNOSIS — H4310 Vitreous hemorrhage, unspecified eye: Secondary | ICD-10-CM | POA: Diagnosis not present

## 2011-07-22 LAB — PSA: PSA: 0.23 ng/mL (ref <=4.00)

## 2011-07-22 MED ORDER — PANTOPRAZOLE SODIUM 40 MG PO TBEC: 40.0000 mg | DELAYED_RELEASE_TABLET | Freq: Every day | ORAL | Status: DC

## 2011-07-22 NOTE — Telephone Encounter (Signed)
Refill sent; #90 x 1 refill. 

## 2011-07-22 NOTE — Telephone Encounter (Signed)
Refill- pantoprazole sod dr 40mg  tab. Every day. Qty 90 last fill 2.5.13

## 2011-07-28 ENCOUNTER — Encounter: Payer: Self-pay | Admitting: Internal Medicine

## 2011-07-28 ENCOUNTER — Telehealth: Payer: Self-pay | Admitting: Internal Medicine

## 2011-07-28 ENCOUNTER — Ambulatory Visit (INDEPENDENT_AMBULATORY_CARE_PROVIDER_SITE_OTHER): Payer: Medicare Other | Admitting: Internal Medicine

## 2011-07-28 VITALS — BP 128/80 | HR 79 | Temp 97.9°F | Resp 18 | Ht 75.0 in | Wt 261.0 lb

## 2011-07-28 DIAGNOSIS — E291 Testicular hypofunction: Secondary | ICD-10-CM

## 2011-07-28 DIAGNOSIS — R7309 Other abnormal glucose: Secondary | ICD-10-CM

## 2011-07-28 DIAGNOSIS — E785 Hyperlipidemia, unspecified: Secondary | ICD-10-CM

## 2011-07-28 DIAGNOSIS — I10 Essential (primary) hypertension: Secondary | ICD-10-CM

## 2011-07-28 DIAGNOSIS — N429 Disorder of prostate, unspecified: Secondary | ICD-10-CM

## 2011-07-28 DIAGNOSIS — Z79899 Other long term (current) drug therapy: Secondary | ICD-10-CM

## 2011-07-28 NOTE — Assessment & Plan Note (Signed)
Tolerating androderm. Improved levels but remain low. Defers increase in dose

## 2011-07-28 NOTE — Assessment & Plan Note (Signed)
Normotensive and stable. Continue current regimen. Monitor bp as outpt and followup in clinic as scheduled.  

## 2011-07-28 NOTE — Telephone Encounter (Signed)
Lab orders entered for September 2013. 

## 2011-07-28 NOTE — Patient Instructions (Signed)
Please schedule fasting labs prior to next visit Cbc, chem7-v58.69. a1c-hyperglycemia, lipid/lft-272.4, testosterone, psa- hypogonadism

## 2011-07-28 NOTE — Progress Notes (Signed)
  Subjective:    Patient ID: [name], male    DOB: Jul 17, 1945, 66 y.o.   MRN: [id number]  HPI Pt presents to clinic for followup of multiple medical problems. Tolerating androderm (previously intolerant of androgel) and testosterone low but improved. Reviewed mildly elevated a1c. States is limiting sugars/carbs. BP reviewed normotensive. Has exercise machine at home and plans on using it daily 20 mins.  Past Medical History  Diagnosis Date  . GERD (gastroesophageal reflux disease)   . Gout   . Hyperlipidemia   . Hypertension   . CVA (cerebral infarction)     left hemiparesis  . Chronic pain     left sided-Kristeins  . CAD (coronary artery disease)     s/p CABG, s/p DES to LCX  January 2011  . OSA on CPAP   . Sleep apnea   . History of colonoscopy    Past Surgical History  Procedure Date  . Coronary artery bypass graft     stent  . Esophagogastroduodenoscopy 03-25-2005  . Nasal septum surgery   . Percutaneous placement intravascular stent cervical carotid artery     03-2009; using a drug-eluting platform of the circumflex cornoray artery with a 3.0 x 18 [employer] Promus drug-eluting platform post dilated to 3.75 with a noncompliant balloon.  . Cataract extraction 05/2010    left eye  . Cataract extraction 04/2010    right eye  . Cardioversion 06/20/2011    Procedure: CARDIOVERSION;  Surgeon: Laurey Morale, MD;  Location: [facility] ENDOSCOPY;  Service: Cardiovascular;  Laterality: N/A;  . Tee without cardioversion 06/20/2011    Procedure: TRANSESOPHAGEAL ECHOCARDIOGRAM (TEE);  Surgeon: Laurey Morale, MD;  Location: [facility] ENDOSCOPY;  Service: Cardiovascular;  Laterality: N/A;    reports that he has never smoked. He has never used smokeless tobacco. He reports that he does not drink alcohol or use illicit drugs. family history includes Lung cancer in his father and Stroke in his mother. No Known Allergies    Review of Systems see hpi     Objective:   Physical  Exam  Physical Exam  Nursing note and vitals reviewed. Constitutional: Appears well-developed and well-nourished. No distress.  HENT:  Head: Normocephalic and atraumatic.  Right Ear: External ear normal.  Left Ear: External ear normal.  Eyes: Conjunctivae are normal. No scleral icterus.  Neck: Neck supple. Carotid bruit is not present.  Cardiovascular: Normal rate, regular rhythm and normal heart sounds.  Exam reveals no gallop and no friction rub.   No murmur heard. Pulmonary/Chest: Effort normal and breath sounds normal. No respiratory distress. He has no wheezes. no rales.  Lymphadenopathy:    He has no cervical adenopathy.  Neurological:Alert.  Skin: Skin is warm and dry. Not diaphoretic.  Psychiatric: Has a normal mood and affect.        Assessment & Plan:

## 2011-07-28 NOTE — Assessment & Plan Note (Signed)
Recommend attention to carb/sugar intake. Agree with regular exercise. Obtain chem7, a1c prior to next visit

## 2011-07-31 ENCOUNTER — Ambulatory Visit (INDEPENDENT_AMBULATORY_CARE_PROVIDER_SITE_OTHER): Payer: Medicare Other | Admitting: *Deleted

## 2011-07-31 DIAGNOSIS — I4891 Unspecified atrial fibrillation: Secondary | ICD-10-CM

## 2011-07-31 DIAGNOSIS — Z7901 Long term (current) use of anticoagulants: Secondary | ICD-10-CM

## 2011-07-31 LAB — POCT INR: INR: 2.8

## 2011-08-17 ENCOUNTER — Other Ambulatory Visit: Payer: Self-pay | Admitting: Cardiology

## 2011-08-21 ENCOUNTER — Ambulatory Visit (INDEPENDENT_AMBULATORY_CARE_PROVIDER_SITE_OTHER): Payer: Medicare Other | Admitting: *Deleted

## 2011-08-21 DIAGNOSIS — Z7901 Long term (current) use of anticoagulants: Secondary | ICD-10-CM | POA: Diagnosis not present

## 2011-08-21 DIAGNOSIS — I4891 Unspecified atrial fibrillation: Secondary | ICD-10-CM

## 2011-08-27 ENCOUNTER — Other Ambulatory Visit: Payer: Self-pay | Admitting: Physical Medicine & Rehabilitation

## 2011-09-02 DIAGNOSIS — H4310 Vitreous hemorrhage, unspecified eye: Secondary | ICD-10-CM | POA: Diagnosis not present

## 2011-09-02 DIAGNOSIS — H43819 Vitreous degeneration, unspecified eye: Secondary | ICD-10-CM | POA: Diagnosis not present

## 2011-09-04 ENCOUNTER — Other Ambulatory Visit: Payer: Self-pay | Admitting: Internal Medicine

## 2011-09-04 ENCOUNTER — Other Ambulatory Visit: Payer: Self-pay | Admitting: Family

## 2011-09-04 NOTE — Telephone Encounter (Signed)
Rx refill sent to pharmacy. 

## 2011-09-15 ENCOUNTER — Ambulatory Visit (INDEPENDENT_AMBULATORY_CARE_PROVIDER_SITE_OTHER): Payer: Medicare Other | Admitting: *Deleted

## 2011-09-15 DIAGNOSIS — I4891 Unspecified atrial fibrillation: Secondary | ICD-10-CM

## 2011-09-15 DIAGNOSIS — Z7901 Long term (current) use of anticoagulants: Secondary | ICD-10-CM | POA: Diagnosis not present

## 2011-09-16 ENCOUNTER — Telehealth: Payer: Self-pay | Admitting: Internal Medicine

## 2011-09-16 NOTE — Telephone Encounter (Signed)
Refill- levothyroxine tablet. Take one tablet every day. Qty 90 last fill 3.23.13

## 2011-09-17 NOTE — Telephone Encounter (Signed)
Left detailed message on pharmacy voicemail to use refill on file from 09/04/11 #90 x 1 refill and to call if any question.

## 2011-09-29 DIAGNOSIS — L608 Other nail disorders: Secondary | ICD-10-CM | POA: Diagnosis not present

## 2011-09-29 DIAGNOSIS — L84 Corns and callosities: Secondary | ICD-10-CM | POA: Diagnosis not present

## 2011-09-29 DIAGNOSIS — I739 Peripheral vascular disease, unspecified: Secondary | ICD-10-CM | POA: Diagnosis not present

## 2011-10-08 DIAGNOSIS — H43399 Other vitreous opacities, unspecified eye: Secondary | ICD-10-CM | POA: Diagnosis not present

## 2011-10-08 DIAGNOSIS — H43819 Vitreous degeneration, unspecified eye: Secondary | ICD-10-CM | POA: Diagnosis not present

## 2011-10-16 ENCOUNTER — Other Ambulatory Visit: Payer: Self-pay | Admitting: Internal Medicine

## 2011-10-16 ENCOUNTER — Encounter: Payer: Self-pay | Admitting: Physical Medicine & Rehabilitation

## 2011-10-16 ENCOUNTER — Other Ambulatory Visit: Payer: Self-pay | Admitting: Physical Medicine & Rehabilitation

## 2011-10-16 ENCOUNTER — Encounter: Payer: Medicare Other | Attending: Physical Medicine & Rehabilitation

## 2011-10-16 ENCOUNTER — Ambulatory Visit (HOSPITAL_BASED_OUTPATIENT_CLINIC_OR_DEPARTMENT_OTHER): Payer: Medicare Other | Admitting: Physical Medicine & Rehabilitation

## 2011-10-16 VITALS — BP 143/70 | HR 74 | Resp 16 | Ht 75.0 in | Wt 263.0 lb

## 2011-10-16 DIAGNOSIS — G8110 Spastic hemiplegia affecting unspecified side: Secondary | ICD-10-CM

## 2011-10-16 DIAGNOSIS — I69959 Hemiplegia and hemiparesis following unspecified cerebrovascular disease affecting unspecified side: Secondary | ICD-10-CM | POA: Diagnosis not present

## 2011-10-16 DIAGNOSIS — R209 Unspecified disturbances of skin sensation: Secondary | ICD-10-CM | POA: Diagnosis not present

## 2011-10-16 DIAGNOSIS — R42 Dizziness and giddiness: Secondary | ICD-10-CM | POA: Insufficient documentation

## 2011-10-16 MED ORDER — TRAMADOL HCL ER 100 MG PO TB24: 100.0000 mg | ORAL_TABLET | ORAL | Status: DC

## 2011-10-16 MED ORDER — TRAMADOL HCL 50 MG PO TABS: 50.0000 mg | ORAL_TABLET | Freq: Two times a day (BID) | ORAL | Status: DC

## 2011-10-16 NOTE — Progress Notes (Signed)
Botox Injection for spasticity using needle EMG guidance  Dilution: 50 Units/ml  Indication: Severe spasticity which interferes with ADL,mobility and/or hygiene and is unresponsive to medication management and other conservative care  Informed consent was obtained after describing risks and benefits of the procedure with the patient. This includes bleeding, bruising, infection, excessive weakness, or medication side effects. A REMS form is on file and signed.  Needle: 26g,2" needle electrode  Number of units per muscle  Pectoralis0  Biceps0  FCR50  FCU0  FDS50  FDP0  Gastrosoleus0  Hamstrings 200   All injections were done after obtaining appropriate EMG activity and after negative drawback for blood. The patient tolerated the procedure well. Post procedure instructions were given. A followup appointment was made.    

## 2011-10-16 NOTE — Progress Notes (Signed)
Subjective:    Patient ID: [name], male    DOB: 20-Dec-1945, 66 y.o.   MRN: [id number]  HPI More spasms in leg than arm Pain Inventory Average Pain 0 Pain Right Now 0 My pain is stabbing and tingling  In the last 24 hours, has pain interfered with the following? General activity 5 Relation with others 9 Enjoyment of life 8 What TIME of day is your pain at its worst? daytime Sleep (in general) Fair  Pain is worse with: some activites Pain improves with: medication Relief from Meds: 8  Mobility walk without assistance how many minutes can you walk? 30 ability to climb steps?  yes do you drive?  yes Do you have any goals in this area?  no  Function retired Do you have any goals in this area?  no  Neuro/Psych numbness tingling  Prior Studies Any changes since last visit?  no  Physicians involved in your care Any changes since last visit?  no   Family History  Problem Relation Age of Onset  . Lung cancer Father     deceased  . Stroke Mother     deceased-MINISTROKES   History   Social History  . Marital Status: Married    Spouse Name: [name]    Number of Children: N/A  . Years of Education: N/A   Occupational History  . retired Web designer   Social History Main Topics  . Smoking status: Never Smoker   . Smokeless tobacco: Never Used  . Alcohol Use: No  . Drug Use: No  . Sexually Active: Yes   Other Topics Concern  . None   Social History Narrative  . None   Past Surgical History  Procedure Date  . Coronary artery bypass graft     stent  . Esophagogastroduodenoscopy 03-25-2005  . Nasal septum surgery   . Percutaneous placement intravascular stent cervical carotid artery     03-2009; using a drug-eluting platform of the circumflex cornoray artery with a 3.0 x 18 [employer] Promus drug-eluting platform post dilated to 3.75 with a noncompliant balloon.  . Cataract extraction 05/2010    left eye  . Cataract extraction  04/2010    right eye  . Cardioversion 06/20/2011    Procedure: CARDIOVERSION;  Surgeon: Laurey Morale, MD;  Location: [facility] ENDOSCOPY;  Service: Cardiovascular;  Laterality: N/A;  . Tee without cardioversion 06/20/2011    Procedure: TRANSESOPHAGEAL ECHOCARDIOGRAM (TEE);  Surgeon: Laurey Morale, MD;  Location: [facility] ENDOSCOPY;  Service: Cardiovascular;  Laterality: N/A;   Past Medical History  Diagnosis Date  . GERD (gastroesophageal reflux disease)   . Gout   . Hyperlipidemia   . Hypertension   . CVA (cerebral infarction)     left hemiparesis  . Chronic pain     left sided-Kristeins  . CAD (coronary artery disease)     s/p CABG, s/p DES to LCX  January 2011  . OSA on CPAP   . Sleep apnea   . History of colonoscopy    BP 143/70  Pulse 74  Resp 16  Ht 6\' 3"  (1.905 m)  Wt 263 lb (119.296 kg)  BMI 32.87 kg/m2  SpO2 96%     Review of Systems  Musculoskeletal: Positive for myalgias and arthralgias.  Neurological: Positive for numbness.  All other systems reviewed and are negative.       Objective:   Physical Exam  Increased tone ashorth 3 in biceps femoris ashworth 2 in  L wrist and finger flexors      Assessment & Plan:  342.12  Spastic hemiparesis Botox 100U in L forearm 200Unit in hamstring

## 2011-10-16 NOTE — Telephone Encounter (Signed)
Done/SLS 

## 2011-10-20 ENCOUNTER — Telehealth: Payer: Self-pay | Admitting: Internal Medicine

## 2011-10-20 NOTE — Telephone Encounter (Signed)
Wants to go to [facility] in [place].  Wants appointment asap

## 2011-10-20 NOTE — Telephone Encounter (Signed)
Wants a referral to an audiologist for hearing test.

## 2011-10-21 ENCOUNTER — Other Ambulatory Visit: Payer: Self-pay | Admitting: Internal Medicine

## 2011-10-21 DIAGNOSIS — H9190 Unspecified hearing loss, unspecified ear: Secondary | ICD-10-CM

## 2011-10-22 ENCOUNTER — Other Ambulatory Visit: Payer: Self-pay | Admitting: Internal Medicine

## 2011-10-22 ENCOUNTER — Other Ambulatory Visit: Payer: Self-pay | Admitting: Physical Medicine & Rehabilitation

## 2011-10-23 ENCOUNTER — Other Ambulatory Visit: Payer: Self-pay | Admitting: Physical Medicine & Rehabilitation

## 2011-10-24 DIAGNOSIS — H903 Sensorineural hearing loss, bilateral: Secondary | ICD-10-CM | POA: Diagnosis not present

## 2011-10-24 NOTE — Telephone Encounter (Signed)
Denial sent to [facility] for pantop[facility]ole as refill was sent in May for 90 day supply x 1 refill. Spoke to [place] and he states pt has already picked up his last refill on this rx.

## 2011-10-27 ENCOUNTER — Encounter: Payer: Self-pay | Admitting: *Deleted

## 2011-10-27 ENCOUNTER — Ambulatory Visit: Payer: Medicare Other | Admitting: Physical Medicine & Rehabilitation

## 2011-11-03 ENCOUNTER — Other Ambulatory Visit: Payer: Self-pay | Admitting: Physical Medicine & Rehabilitation

## 2011-11-03 ENCOUNTER — Encounter: Payer: Self-pay | Admitting: Cardiology

## 2011-11-03 ENCOUNTER — Ambulatory Visit (INDEPENDENT_AMBULATORY_CARE_PROVIDER_SITE_OTHER): Payer: Medicare Other

## 2011-11-03 ENCOUNTER — Ambulatory Visit (INDEPENDENT_AMBULATORY_CARE_PROVIDER_SITE_OTHER): Payer: Medicare Other | Admitting: Cardiology

## 2011-11-03 VITALS — BP 132/80 | HR 64 | Ht 75.0 in | Wt 264.0 lb

## 2011-11-03 DIAGNOSIS — I2510 Atherosclerotic heart disease of native coronary artery without angina pectoris: Secondary | ICD-10-CM | POA: Diagnosis not present

## 2011-11-03 DIAGNOSIS — I25810 Atherosclerosis of coronary artery bypass graft(s) without angina pectoris: Secondary | ICD-10-CM | POA: Diagnosis not present

## 2011-11-03 DIAGNOSIS — I4891 Unspecified atrial fibrillation: Secondary | ICD-10-CM

## 2011-11-03 DIAGNOSIS — R5383 Other fatigue: Secondary | ICD-10-CM | POA: Diagnosis not present

## 2011-11-03 DIAGNOSIS — Z7901 Long term (current) use of anticoagulants: Secondary | ICD-10-CM | POA: Diagnosis not present

## 2011-11-03 DIAGNOSIS — R5381 Other malaise: Secondary | ICD-10-CM | POA: Diagnosis not present

## 2011-11-03 DIAGNOSIS — E785 Hyperlipidemia, unspecified: Secondary | ICD-10-CM

## 2011-11-03 LAB — CBC WITH DIFFERENTIAL/PLATELET
Eosinophils Relative: 7.4 % — ABNORMAL HIGH (ref 0.0–5.0)
HCT: 39.1 % (ref 39.0–52.0)
Lymphs Abs: 1.7 10*3/uL (ref 0.7–4.0)
MCV: 85.3 fl (ref 78.0–100.0)
Monocytes Absolute: 0.4 10*3/uL (ref 0.1–1.0)
Neutro Abs: 2.4 10*3/uL (ref 1.4–7.7)
Platelets: 159 10*3/uL (ref 150.0–400.0)
WBC: 5 10*3/uL (ref 4.5–10.5)

## 2011-11-03 LAB — TSH: TSH: 1.6 u[IU]/mL (ref 0.35–5.50)

## 2011-11-03 NOTE — Assessment & Plan Note (Addendum)
[name] reports a significant lack of energy/fatigue.  He wakes up exhausted.  This seems to be new.  He denies significant exertional dyspnea or chest pain though he is somewhat limited by weakness after prior CVA.  He has known OSA and cannot tolerate CPAP. He does use oxygen at night.  OSA could be contributing to the fatigue but is not new . I will check a CBC and TSH.  I will also get a Lexiscan myoview to assess for ischemia.  He has no history of hypotension, but I will get him to check his BP daily (has cuff) to make sure that his BP is dropping too low on his BP meds.

## 2011-11-03 NOTE — Assessment & Plan Note (Signed)
LDL at goal (< 70) when recently checked.  

## 2011-11-03 NOTE — Assessment & Plan Note (Signed)
No chest pain but new significant fatigue.  As above, I am getting a Lexiscan myoview.  Continue ASA 81, statin, benazepril.

## 2011-11-03 NOTE — Assessment & Plan Note (Signed)
Paroxysmal atrial fibrillation.  Patient had 1 episode prior to 4/13.  This episode was likely triggered by acute illness (flu).  He is in NSR today.  Continue diltiazem CD and warfarin.

## 2011-11-03 NOTE — Progress Notes (Signed)
Patient ID: [name], male   DOB: 1945/07/04, 66 y.o.   MRN: [id number] PCP: Dr. Rodena Medin  66 yo with history of CAD s/p CABG and paroxysmal atrial fibrillation with history of prior CVA returns for followup.  He remains in NSR today.  His main complaint over the last few months has been fatigue and lack of energy.  He wakes up in the morning feeling "drained" and exhausted.  He has OSA and cannot tolerate CPAP but has been wearing oxygen at night.  He has not had hypotension that we know of.  No melena or bright red blood per rectum.  He has been on a stable Levoxyl dose.  No chest pain.  No exertional dyspnea.  He is able to do yardwork and walk on flat ground and up steps without significant dyspnea.  He has some residual gait difficulty because of his stroke.   ECG: NSR, LAFB, LVH, nonspecific T wave flattening  Labs (4/13): K 5, creatinine 1.11, LDL 32, HDL 30  PMH: 1. GERD 2. Gout 3. Hypothyroidism 4. HTN 5. H/o CVA: some residual left-sided weakness.  6. Borderline DM2 7. CAD s/p CABG in 2004.  LHC in 2011: SVG-RCA and SVG-OM totally occluded.  LIMA-LAD patent.  Patient had DES to CFX, flow down native RCA was ok.  8. OSA: Intolerant of CPAP, wears oxygen at night.  9. Paroxysmal atrial fibrillation. TEE (4/13) with EF 55-60%, no significant valvular abnormalities.   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  Nonsmoker.  Lives in [place].    FH: No premature CAD.   ROS: All systems reviewed and negative except as per HPI.   Current Outpatient Prescriptions  Medication Sig Dispense Refill  . allopurinol (ZYLOPRIM) 100 MG tablet TAKE 2 TABLETS BY MOUTH EVERY DAY  180 tablet  1  . aspirin 81 MG tablet Take 81 mg by mouth daily.        . benazepril (LOTENSIN) 5 MG tablet TAKE 1 TABLET BY MOUTH EVERY DAY  90 tablet  1  . CRESTOR 40 MG tablet TAKE 1 TABLET BY MOUTH EVERY DAY  90 tablet  1  . diltiazem (CARDIZEM CD) 180 MG 24 hr capsule Take 1  capsule (180 mg total) by mouth daily.  30 capsule  0  . furosemide (LASIX) 40 MG tablet Take 40 mg by mouth daily as needed. For fluid retention      . levothyroxine (SYNTHROID, LEVOTHROID) 112 MCG tablet TAKE 1 TABLET EVERY DAY  90 tablet  1  . metFORMIN (GLUCOPHAGE) 500 MG tablet Take 500 mg by mouth daily with breakfast.      . nitroGLYCERIN (NITROSTAT) 0.4 MG SL tablet Place 0.4 mg under the tongue every 5 (five) minutes as needed. For chest pain Up to 3 doses      . NON FORMULARY 2 L/M for sleep       . pantoprazole (PROTONIX) 40 MG tablet Take 1 tablet (40 mg total) by mouth daily.  90 tablet  1  . Testosterone (ANDRODERM) 2 MG/24HR PT24 Place 1 patch onto the skin daily.  30 patch  3  . tiZANidine (ZANAFLEX) 2 MG tablet TAKE 1 TABLET AT BEDTIME  90 tablet  2  . traMADol (ULTRAM) 50 MG tablet Take 1 tablet (50 mg total) by mouth 2 (two) times daily.  180 tablet  2  . traMADol (ULTRAM-ER) 200 MG 24 hr tablet TAKE 1 TABLET BY MOUTH EVERY DAY  90 tablet  2  .  warfarin (COUMADIN) 2.5 MG tablet Take as directed by Anticoagulation clinic  35 tablet  1  . DISCONTD: metFORMIN (GLUCOPHAGE) 500 MG tablet Take 1 tablet (500 mg total) by mouth daily with breakfast.  30 tablet  6  . DISCONTD: traMADol (ULTRAM-ER) 100 MG 24 hr tablet Take 1 tablet (100 mg total) by mouth every morning.  90 tablet  2    BP 132/80  Pulse 64  Ht 6\' 3"  (1.905 m)  Wt 264 lb (119.75 kg)  BMI 33.00 kg/m2 General: NAD, overweight Neck: Thick, no JVD, no thyromegaly or thyroid nodule.  Lungs: Clear to auscultation bilaterally with normal respiratory effort. CV: Nondisplaced PMI.  Heart regular S1/S2, no S3/S4, no murmur.  No peripheral edema.  No carotid bruit.  Normal pedal pulses.  Abdomen: Soft, nontender, no hepatosplenomegaly, no distention.  Neurologic: Alert and oriented x 3.  Psych: Normal affect. Extremities: No clubbing or cyanosis.

## 2011-11-03 NOTE — Patient Instructions (Addendum)
Your physician recommends that you have  lab work today--CBC/TSH.  Your physician has requested that you have a lexiscan myoview. For further information please visit [email or web address] Please follow instruction sheet, as given.  Take and record your blood pressure daily. Bring these readings to your appointment with Dr Shirlee Latch in 1 month.  Your physician recommends that you schedule a follow-up appointment in: 1 month with Dr Shirlee Latch.

## 2011-11-11 DIAGNOSIS — H903 Sensorineural hearing loss, bilateral: Secondary | ICD-10-CM | POA: Diagnosis not present

## 2011-11-12 ENCOUNTER — Ambulatory Visit (HOSPITAL_COMMUNITY): Payer: Medicare Other | Attending: Cardiology | Admitting: Radiology

## 2011-11-12 VITALS — BP 144/82 | Ht 75.0 in | Wt 258.0 lb

## 2011-11-12 DIAGNOSIS — I10 Essential (primary) hypertension: Secondary | ICD-10-CM | POA: Insufficient documentation

## 2011-11-12 DIAGNOSIS — I2510 Atherosclerotic heart disease of native coronary artery without angina pectoris: Secondary | ICD-10-CM | POA: Diagnosis not present

## 2011-11-12 DIAGNOSIS — I4891 Unspecified atrial fibrillation: Secondary | ICD-10-CM | POA: Insufficient documentation

## 2011-11-12 DIAGNOSIS — R5383 Other fatigue: Secondary | ICD-10-CM

## 2011-11-12 DIAGNOSIS — R5381 Other malaise: Secondary | ICD-10-CM | POA: Diagnosis not present

## 2011-11-12 DIAGNOSIS — I25810 Atherosclerosis of coronary artery bypass graft(s) without angina pectoris: Secondary | ICD-10-CM

## 2011-11-12 MED ORDER — REGADENOSON 0.4 MG/5ML IV SOLN
0.4000 mg | Freq: Once | INTRAVENOUS | Status: AC
  Administered 2011-11-12: 0.4 mg via INTRAVENOUS

## 2011-11-12 MED ORDER — TECHNETIUM TC 99M TETROFOSMIN IV KIT
10.0000 | PACK | Freq: Once | INTRAVENOUS | Status: AC | PRN
  Administered 2011-11-12: 10 via INTRAVENOUS

## 2011-11-12 MED ORDER — TECHNETIUM TC 99M TETROFOSMIN IV KIT
30.0000 | PACK | Freq: Once | INTRAVENOUS | Status: AC | PRN
  Administered 2011-11-12: 30 via INTRAVENOUS

## 2011-11-12 NOTE — Progress Notes (Addendum)
[facility] SITE 3 NUCLEAR MED [address] [place] [address] [phone]  Cardiology Nuclear Med Study  [name] is a 66 y.o. male     MRN : [id number]     DOB: Jul 22, 1945  Procedure Date: 11/12/2011  Nuclear Med Background Indication for Stress Test:  Evaluation for Ischemia and Graft Patency History:  2004 CABG x5 ([place]) 08/28/05 MPS: Low Risk possible mild inferolateral ischemia EF: 53% 2011 Heart Cath: LIMA -LAD patent SVG-RCA SVG-OM totally occludded 06/2011 AFIB PAF 1 episode prior  4/13   06/2011 TEE EF: 55-60% Cardiac Risk Factors: CVA, Hypertension and Lipids  Symptoms:  Fatigue and Fatigue with Exertion   Nuclear Pre-Procedure Caffeine/Decaff Intake:  None NPO After: 7:00pm   Lungs:  clear O2 Sat: 95% on room air. IV 0.9% NS with Angio Cath:  20g  IV Site: R Hand  IV Started by:  Bonnita Levan, RN  Chest Size (in):  50 Cup Size: n/a  Height: 6\' 3"  (1.905 m)  Weight:  258 lb (117.028 kg)  BMI:  Body mass index is 32.25 kg/(m^2). Tech Comments: N/A    Nuclear Med Study 1 or 2 day study: 1 day  Stress Test Type:  Lexiscan  Reading MD: Olga Millers, MD  Order Authorizing Provider:  [name], MD  Resting Radionuclide: Technetium 14m Tetrofosmin  Resting Radionuclide Dose: 11.0 mCi   Stress Radionuclide:  Technetium 16m Tetrofosmin  Stress Radionuclide Dose: 33.0 mCi           Stress Protocol Rest HR: 69 Stress HR: 80  Rest BP: 144/82 Stress BP: 146/87  Exercise Time (min): n/a METS: n/a   Predicted Max HR: 152 bpm % Max HR: 52.63 bpm Rate Pressure Product: 41324   Dose of Adenosine (mg):  n/a Dose of Lexiscan: 0.4 mg  Dose of Atropine (mg): n/a Dose of Dobutamine: n/a mcg/kg/min (at max HR)  Stress Test Technologist: Milana Na, EMT-P  Nuclear Technologist:  Domenic Polite, CNMT     Rest Procedure:  Myocardial perfusion imaging was performed at rest 45 minutes following the intravenous administration of Technetium 25m  Tetrofosmin. Rest ECG: NSR - Normal EKG  Stress Procedure:  The patient received IV Lexiscan 0.4 mg over 15-seconds.  Technetium 25m Tetrofosmin injected at 30-seconds.  There were no significant changes, + sob, and rare pacs with Lexiscan.  Quantitative spect images were obtained after a 45 minute delay. Stress ECG: No significant change from baseline ECG  QPS Raw Data Images:  Acquisition technically good; normal left ventricular size. Stress Images:  There is decreased uptake in the inferior wall. Rest Images:  There is decreased uptake in the inferior wall. Subtraction (SDS):  No evidence of ischemia. Transient Ischemic Dilatation (Normal <1.22):  0.96 Lung/Heart Ratio (Normal <0.45):  0.41  Quantitative Gated Spect Images QGS EDV:  102 ml QGS ESV:  39 ml  Impression Exercise Capacity:  Lexiscan with no exercise. BP Response:  Normal blood pressure response. Clinical Symptoms:  There is dyspnea. ECG Impression:  No significant ST segment change suggestive of ischemia. Comparison with Prior Nuclear Study: No images to compare  Overall Impression:  Normal stress nuclear study with a small, mild, fixed inferior defect consistent with inferior thinning but no ischemia.  LV Ejection Fraction: 62%.  LV Wall Motion:  NL LV Function; NL Wall Motion   [name]  Normal study.  Please inform patient.   [name] 11/15/2011 11:57 AM

## 2011-11-18 ENCOUNTER — Ambulatory Visit: Payer: BLUE CROSS/BLUE SHIELD | Admitting: Physical Medicine & Rehabilitation

## 2011-11-18 ENCOUNTER — Encounter: Payer: Medicare Other | Attending: Physical Medicine & Rehabilitation

## 2011-11-18 DIAGNOSIS — R42 Dizziness and giddiness: Secondary | ICD-10-CM | POA: Insufficient documentation

## 2011-11-18 DIAGNOSIS — R209 Unspecified disturbances of skin sensation: Secondary | ICD-10-CM | POA: Insufficient documentation

## 2011-11-18 DIAGNOSIS — I69959 Hemiplegia and hemiparesis following unspecified cerebrovascular disease affecting unspecified side: Secondary | ICD-10-CM | POA: Insufficient documentation

## 2011-11-18 NOTE — Progress Notes (Signed)
Pt.notified

## 2011-11-20 DIAGNOSIS — H43399 Other vitreous opacities, unspecified eye: Secondary | ICD-10-CM | POA: Diagnosis not present

## 2011-11-20 DIAGNOSIS — H43819 Vitreous degeneration, unspecified eye: Secondary | ICD-10-CM | POA: Diagnosis not present

## 2011-11-21 ENCOUNTER — Encounter: Payer: Self-pay | Admitting: Physical Medicine & Rehabilitation

## 2011-11-21 ENCOUNTER — Ambulatory Visit (HOSPITAL_BASED_OUTPATIENT_CLINIC_OR_DEPARTMENT_OTHER): Payer: Medicare Other | Admitting: Physical Medicine & Rehabilitation

## 2011-11-21 ENCOUNTER — Other Ambulatory Visit: Payer: Self-pay | Admitting: Internal Medicine

## 2011-11-21 ENCOUNTER — Other Ambulatory Visit: Payer: Self-pay | Admitting: Cardiology

## 2011-11-21 VITALS — BP 155/90 | HR 80 | Resp 14 | Ht 75.0 in | Wt 262.0 lb

## 2011-11-21 DIAGNOSIS — G8110 Spastic hemiplegia affecting unspecified side: Secondary | ICD-10-CM

## 2011-11-21 DIAGNOSIS — M545 Low back pain, unspecified: Secondary | ICD-10-CM | POA: Diagnosis not present

## 2011-11-21 DIAGNOSIS — R42 Dizziness and giddiness: Secondary | ICD-10-CM | POA: Diagnosis not present

## 2011-11-21 DIAGNOSIS — R209 Unspecified disturbances of skin sensation: Secondary | ICD-10-CM | POA: Diagnosis not present

## 2011-11-21 DIAGNOSIS — I69959 Hemiplegia and hemiparesis following unspecified cerebrovascular disease affecting unspecified side: Secondary | ICD-10-CM | POA: Diagnosis not present

## 2011-11-21 MED ORDER — METHYLPREDNISOLONE 4 MG PO KIT: PACK | ORAL | Status: AC

## 2011-11-21 NOTE — Telephone Encounter (Signed)
Thanks for the assitance .

## 2011-11-21 NOTE — Patient Instructions (Signed)
Follow instruction on Medrol Dosepak. See me in one month. Call me if the Medrol Dosepak is not helpful

## 2011-11-21 NOTE — Progress Notes (Signed)
Subjective:    Patient ID: [name], male    DOB: 16-Nov-1945, 66 y.o.   MRN: [id number]  HPI Chief complaint is low back pain Onset was a week ago after riding on a mower. He states that he had a lot of bumps. He denies any pain shooting down his legs. 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 General activity 8 Relation with others 8 Enjoyment of life 8 What TIME of day is your pain at its worst? varies Sleep (in general) Fair  Pain is worse with: walking Pain improves with: medication Relief from Meds: 5  Mobility walk without assistance how many minutes can you walk? 30 ability to climb steps?  yes do you drive?  yes Do you have any goals in this area?  no  Function disabled: date disabled Retired Do you have any goals in this area?  no  Neuro/Psych No problems in this area  Prior Studies Any changes since last visit?  no  Physicians involved in your care Any changes since last visit?  no   Family History  Problem Relation Age of Onset  . Lung cancer Father     deceased  . Stroke Mother     deceased-MINISTROKES   History   Social History  . Marital Status: Married    Spouse Name: [name]    Number of Children: N/A  . Years of Education: N/A   Occupational History  . retired Web designer   Social History Main Topics  . Smoking status: Never Smoker   . Smokeless tobacco: Never Used  . Alcohol Use: No  . Drug Use: No  . Sexually Active: Yes   Other Topics Concern  . None   Social History Narrative  . None   Past Surgical History  Procedure Date  . Coronary artery bypass graft     stent  . Esophagogastroduodenoscopy 03-25-2005  . Nasal septum surgery   . Percutaneous placement intravascular stent cervical carotid artery     03-2009; using a drug-eluting platform of the circumflex  cornoray artery with a 3.0 x 18 [employer] Promus drug-eluting platform post dilated to 3.75 with a noncompliant balloon.  . Cataract extraction 05/2010    left eye  . Cataract extraction 04/2010    right eye  . Cardioversion 06/20/2011    Procedure: CARDIOVERSION;  Surgeon: Laurey Morale, MD;  Location: [facility] ENDOSCOPY;  Service: Cardiovascular;  Laterality: N/A;  . Tee without cardioversion 06/20/2011    Procedure: TRANSESOPHAGEAL ECHOCARDIOGRAM (TEE);  Surgeon: Laurey Morale, MD;  Location: [facility] ENDOSCOPY;  Service: Cardiovascular;  Laterality: N/A;   Past Medical History  Diagnosis Date  . GERD (gastroesophageal reflux disease)   . Gout   . Hyperlipidemia   . Hypertension   . CVA (cerebral infarction)     left hemiparesis  . Chronic pain     left sided-Kristeins  . CAD (coronary artery disease)     s/p CABG, s/p DES to LCX  January 2011  . OSA on CPAP   . Sleep apnea   . History of colonoscopy    BP 155/90  Pulse 80  Resp 14  Ht 6\' 3"  (1.905 m)  Wt 262 lb (118.842 kg)  BMI 32.75 kg/m2  SpO2 96%    Review of Systems  Musculoskeletal: Positive for  myalgias, arthralgias and gait problem.  All other systems reviewed and are negative.       Objective:   Physical Exam  Constitutional: He is oriented to person, place, and time. He appears well-developed.  HENT:  Head: Normocephalic and atraumatic.  Musculoskeletal:       Lumbar back: He exhibits decreased range of motion and pain. He exhibits no tenderness.       Negative straight leg raising test Pain with lumbar extension in the low back  Neurological: He is alert and oriented to person, place, and time. He exhibits abnormal muscle tone. Coordination and gait abnormal.  Reflex Scores:      Tricep reflexes are 2+ on the right side and 3+ on the left side.      Bicep reflexes are 2+ on the right side and 3+ on the left side.      Brachioradialis reflexes are 2+ on the right side and 3+ on the left side.       Patellar reflexes are 2+ on the right side and 3+ on the left side.      Achilles reflexes are 1+ on the right side and 1+ on the left side.      Normal strength and sensation on the right side reduced strength on the left side graded as 3 minus/5 in the upper extremity and 4 minus in the lower extremity. There is decreased fine motor coordination left hand. Ambulates with a stifflegged gait and has hip hiking to clear his left toes  Psychiatric: He has a normal mood and affect.          Assessment & Plan:  1. Acute low back pain likely facet syndrome. One-week post. He would benefit from an anti-inflammatory however he is on Coumadin. I will write for a Medrol Dosepak. He is not a diabetic. Should this felt to resolve his symptoms. He is to call my office and we can set up an x-ray and then some medial branch blocks. He does feel like he is about 20% better compared to last week I will schedule back 1 mo

## 2011-12-01 ENCOUNTER — Ambulatory Visit: Payer: Medicare Other | Admitting: Internal Medicine

## 2011-12-04 ENCOUNTER — Ambulatory Visit (INDEPENDENT_AMBULATORY_CARE_PROVIDER_SITE_OTHER): Payer: Medicare Other | Admitting: Cardiology

## 2011-12-04 ENCOUNTER — Encounter: Payer: Self-pay | Admitting: Cardiology

## 2011-12-04 VITALS — BP 122/74 | HR 71 | Resp 18 | Ht 75.0 in | Wt 259.8 lb

## 2011-12-04 DIAGNOSIS — R5381 Other malaise: Secondary | ICD-10-CM | POA: Diagnosis not present

## 2011-12-04 DIAGNOSIS — E785 Hyperlipidemia, unspecified: Secondary | ICD-10-CM

## 2011-12-04 DIAGNOSIS — R5383 Other fatigue: Secondary | ICD-10-CM

## 2011-12-04 DIAGNOSIS — I4891 Unspecified atrial fibrillation: Secondary | ICD-10-CM

## 2011-12-04 DIAGNOSIS — I2510 Atherosclerotic heart disease of native coronary artery without angina pectoris: Secondary | ICD-10-CM

## 2011-12-04 NOTE — Progress Notes (Signed)
Patient ID: [name], male   DOB: 24-Jul-1945, 66 y.o.   MRN: [id number] PCP: Dr. Rodena Medin  66 yo with history of CAD s/p CABG and paroxysmal atrial fibrillation with history of prior CVA returns for followup.  He remains in NSR today.  At last appointment, he reported increased fatigue.  Lexiscan myoview was done, showing no ischemia and normal EF.  He has been doing better more recently.  No chest pain.  No exertional dyspnea.  He is able to do yardwork and walk on flat ground and up steps without significant dyspnea.  He has some residual gait difficulty because of his stroke.  SBP has been running in the 120s-130s.   Labs (4/13): K 5, creatinine 1.11, LDL 32, HDL 30 Labs (8/13): HCT 39.1, TSH normal  PMH: 1. GERD 2. Gout 3. Hypothyroidism 4. HTN 5. H/o CVA: some residual left-sided weakness.  6. Borderline DM2 7. CAD s/p CABG in 2004.  LHC in 2011: SVG-RCA and SVG-OM totally occluded.  LIMA-LAD patent.  Patient had DES to CFX, flow down native RCA was ok.  Lexiscan myoview (8/13): EF 62%, small mild fixed inferior defect with no ischemia . 8. OSA: Intolerant of CPAP, wears oxygen at night.  9. Paroxysmal atrial fibrillation. TEE (4/13) with EF 55-60%, no significant valvular abnormalities.   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  Nonsmoker.  Lives in [place].    FH: No premature CAD.   Current Outpatient Prescriptions  Medication Sig Dispense Refill  . aspirin 81 MG tablet Take 81 mg by mouth daily.        . benazepril (LOTENSIN) 5 MG tablet TAKE 1 TABLET BY MOUTH EVERY DAY  90 tablet  1  . CRESTOR 40 MG tablet TAKE 1 TABLET BY MOUTH EVERY DAY  90 tablet  1  . diltiazem (CARDIZEM CD) 180 MG 24 hr capsule Take 1 capsule (180 mg total) by mouth daily.  30 capsule  0  . furosemide (LASIX) 40 MG tablet Take 40 mg by mouth daily as needed. For fluid retention      . furosemide (LASIX) 40 MG tablet TAKE 1 TABLET BY MOUTH EVERY DAY AS DIRECTED AS  NEEDED  90 tablet  1  . levothyroxine (SYNTHROID, LEVOTHROID) 112 MCG tablet TAKE 1 TABLET EVERY DAY  90 tablet  1  . metFORMIN (GLUCOPHAGE) 500 MG tablet Take 500 mg by mouth daily with breakfast.      . nitroGLYCERIN (NITROSTAT) 0.4 MG SL tablet Place 0.4 mg under the tongue every 5 (five) minutes as needed. For chest pain Up to 3 doses      . NON FORMULARY 2 L/M for sleep       . pantoprazole (PROTONIX) 40 MG tablet Take 1 tablet (40 mg total) by mouth daily.  90 tablet  1  . Testosterone (ANDRODERM) 2 MG/24HR PT24 Place 1 patch onto the skin daily.  30 patch  3  . tiZANidine (ZANAFLEX) 2 MG tablet TAKE 1 TABLET AT BEDTIME  90 tablet  2  . traMADol (ULTRAM) 50 MG tablet Take 1 tablet (50 mg total) by mouth 2 (two) times daily.  180 tablet  2  . warfarin (COUMADIN) 2.5 MG tablet TAKE AS DIRECTED BY ANTICOAGULATION CLINIC  35 tablet  3  . allopurinol (ZYLOPRIM) 100 MG tablet TAKE 2 TABLETS BY MOUTH EVERY DAY  180 tablet  1    BP 122/74  Pulse 71  Resp 18  Ht 6\' 3"  (1.905  m)  Wt 259 lb 12.8 oz (117.845 kg)  BMI 32.47 kg/m2  SpO2 97% General: NAD, overweight Neck: Thick, no JVD, no thyromegaly or thyroid nodule.  Lungs: Clear to auscultation bilaterally with normal respiratory effort. CV: Nondisplaced PMI.  Heart regular S1/S2, no S3/S4, no murmur.  No peripheral edema.  No carotid bruit.  Normal pedal pulses.  Abdomen: Soft, nontender, no hepatosplenomegaly, no distention.  Neurologic: Alert and oriented x 3.  Psych: Normal affect. Extremities: No clubbing or cyanosis.   Assessment/Plan: Fatigue This seems to have resolved for the most part.  CBC, TSH and Lexiscan myoview were normal.  As he is feeling better, no further workup at this time.  HYPERLIPIDEMIA  LDL at goal (< 70) when recently checked.  CAD, NATIVE VESSEL  No chest pain, feeling well in general. Recent Lexiscan myoview with no ischemia, done due to profound fatigue, which since has resolved. Continue ASA 81, statin,  benazepril.  Atrial fibrillation  Paroxysmal atrial fibrillation. Patient had 1 episode prior to 4/13. The 4/13 episode was likely triggered by acute illness (flu). He is in NSR today. Continue diltiazem CD and warfarin.   [name] 12/04/2011

## 2011-12-04 NOTE — Patient Instructions (Addendum)
Your physician wants you to follow-up in: 6 months with Dr Shirlee Latch. (March 2014).  You will receive a reminder letter in the mail two months in advance. If you don't receive a letter, please call our office to schedule the follow-up appointment.   Your physician recommends that you return for a FASTING lipid profile: 6 months when you see Dr Shirlee Latch.

## 2011-12-11 ENCOUNTER — Ambulatory Visit (INDEPENDENT_AMBULATORY_CARE_PROVIDER_SITE_OTHER): Payer: Medicare Other | Admitting: Internal Medicine

## 2011-12-11 ENCOUNTER — Encounter: Payer: Self-pay | Admitting: Internal Medicine

## 2011-12-11 VITALS — BP 122/78 | HR 81 | Temp 98.0°F | Resp 18 | Wt 261.2 lb

## 2011-12-11 DIAGNOSIS — E785 Hyperlipidemia, unspecified: Secondary | ICD-10-CM

## 2011-12-11 DIAGNOSIS — E039 Hypothyroidism, unspecified: Secondary | ICD-10-CM

## 2011-12-11 DIAGNOSIS — E291 Testicular hypofunction: Secondary | ICD-10-CM | POA: Diagnosis not present

## 2011-12-11 DIAGNOSIS — E119 Type 2 diabetes mellitus without complications: Secondary | ICD-10-CM | POA: Diagnosis not present

## 2011-12-11 DIAGNOSIS — Z125 Encounter for screening for malignant neoplasm of prostate: Secondary | ICD-10-CM | POA: Diagnosis not present

## 2011-12-11 DIAGNOSIS — R7309 Other abnormal glucose: Secondary | ICD-10-CM

## 2011-12-11 LAB — LIPID PANEL
LDL Cholesterol: 50 mg/dL (ref 0–99)
VLDL: 21 mg/dL (ref 0–40)

## 2011-12-11 LAB — HEMOGLOBIN A1C: Hgb A1c MFr Bld: 6.5 % — ABNORMAL HIGH (ref <5.7)

## 2011-12-11 LAB — HEPATIC FUNCTION PANEL
Alkaline Phosphatase: 51 U/L (ref 39–117)
Bilirubin, Direct: 0.1 mg/dL (ref 0.0–0.3)
Indirect Bilirubin: 0.3 mg/dL (ref 0.0–0.9)
Total Bilirubin: 0.4 mg/dL (ref 0.3–1.2)

## 2011-12-11 LAB — PSA, MEDICARE: PSA: 0.25 ng/mL (ref <=4.00)

## 2011-12-11 MED ORDER — PANTOPRAZOLE SODIUM 40 MG PO TBEC: 40.0000 mg | DELAYED_RELEASE_TABLET | Freq: Every day | ORAL | Status: DC

## 2011-12-11 MED ORDER — BENAZEPRIL HCL 5 MG PO TABS: ORAL_TABLET | ORAL | Status: DC

## 2011-12-11 MED ORDER — METFORMIN HCL 500 MG PO TABS: ORAL_TABLET | ORAL | Status: DC

## 2011-12-14 NOTE — Assessment & Plan Note (Signed)
Obtain fasting lipid profile and liver function tests. 

## 2011-12-14 NOTE — Assessment & Plan Note (Signed)
Obtain A1c.  

## 2011-12-14 NOTE — Assessment & Plan Note (Signed)
Obtain TSH °

## 2011-12-14 NOTE — Progress Notes (Signed)
  Subjective:    Patient ID: [name], male    DOB: [birth year], 66 y.o.   MRN: [id number]  HPI Pt presents to clinic for followup of multiple medical problems. Blood pressure rechecked 122/78. Weight stable. Tolerating statin therapy without myalgias or abnormal LFTs. No active complaint.  Past Medical History  Diagnosis Date  . GERD (gastroesophageal reflux disease)   . Gout   . Hyperlipidemia   . Hypertension   . CVA (cerebral infarction)     left hemiparesis  . Chronic pain     left sided-Kristeins  . CAD (coronary artery disease)     s/p CABG, s/p DES to LCX  January 2011  . OSA on CPAP   . Sleep apnea   . History of colonoscopy    Past Surgical History  Procedure Date  . Coronary artery bypass graft     stent  . Esophagogastroduodenoscopy 03-25-2005  . Nasal septum surgery   . Percutaneous placement intravascular stent cervical carotid artery     03-2009; using a drug-eluting platform of the circumflex cornoray artery with a 3.0 x 18 [employer] Promus drug-eluting platform post dilated to 3.75 with a noncompliant balloon.  . Cataract extraction 05/2010    left eye  . Cataract extraction 04/2010    right eye  . Cardioversion 06/20/2011    Procedure: CARDIOVERSION;  Surgeon: Laurey Morale, MD;  Location: [facility] ENDOSCOPY;  Service: Cardiovascular;  Laterality: N/A;  . Tee without cardioversion 06/20/2011    Procedure: TRANSESOPHAGEAL ECHOCARDIOGRAM (TEE);  Surgeon: Laurey Morale, MD;  Location: [facility] ENDOSCOPY;  Service: Cardiovascular;  Laterality: N/A;    reports that he has never smoked. He has never used smokeless tobacco. He reports that he does not drink alcohol or use illicit drugs. family history includes Lung cancer in his father and Stroke in his mother. No Known Allergies    Review of Systems see hpi     Objective:   Physical Exam  Physical Exam  Nursing note and vitals reviewed. Constitutional: Appears well-developed and well-nourished. No distress.   HENT:  Head: Normocephalic and atraumatic.  Right Ear: External ear normal.  Left Ear: External ear normal.  Eyes: Conjunctivae are normal. No scleral icterus.  Neck: Neck supple. Carotid bruit is not present.  Cardiovascular: Normal rate, regular rhythm and normal heart sounds.  Exam reveals no gallop and no friction rub.   No murmur heard. Pulmonary/Chest: Effort normal and breath sounds normal. No respiratory distress. He has no wheezes. no rales.  Lymphadenopathy:    He has no cervical adenopathy.  Neurological:Alert.  Skin: Skin is warm and dry. Not diaphoretic.  Psychiatric: Has a normal mood and affect.        Assessment & Plan:

## 2011-12-15 ENCOUNTER — Ambulatory Visit (INDEPENDENT_AMBULATORY_CARE_PROVIDER_SITE_OTHER): Payer: Medicare Other

## 2011-12-15 DIAGNOSIS — I4891 Unspecified atrial fibrillation: Secondary | ICD-10-CM | POA: Diagnosis not present

## 2011-12-15 DIAGNOSIS — Z7901 Long term (current) use of anticoagulants: Secondary | ICD-10-CM

## 2011-12-15 LAB — POCT INR: INR: 1.8

## 2011-12-15 MED ORDER — WARFARIN SODIUM 2.5 MG PO TABS: ORAL_TABLET | ORAL | Status: DC

## 2011-12-17 ENCOUNTER — Other Ambulatory Visit (HOSPITAL_COMMUNITY): Payer: Self-pay | Admitting: Internal Medicine

## 2011-12-17 NOTE — Telephone Encounter (Signed)
Received diltiazem request from pharmacy. Verified with Crystal @ [facility] that rx was last filled by Dr Irene Limbo on 06/21/11. Please advise if ok to refill med?

## 2011-12-18 ENCOUNTER — Telehealth: Payer: Self-pay | Admitting: Internal Medicine

## 2011-12-18 ENCOUNTER — Other Ambulatory Visit: Payer: Self-pay | Admitting: Internal Medicine

## 2011-12-18 MED ORDER — DILTIAZEM HCL ER COATED BEADS 180 MG PO CP24: 180.0000 mg | ORAL_CAPSULE | Freq: Every day | ORAL | Status: DC

## 2011-12-18 MED ORDER — LEVOTHYROXINE SODIUM 112 MCG PO TABS: 112.0000 ug | ORAL_TABLET | Freq: Every day | ORAL | Status: DC

## 2011-12-18 NOTE — Telephone Encounter (Signed)
Rx[s] done by [facility] 10.03.13/SLS

## 2011-12-18 NOTE — Telephone Encounter (Signed)
rf by me this am

## 2011-12-18 NOTE — Telephone Encounter (Signed)
Also cardizem 180mg  qty 90

## 2011-12-19 ENCOUNTER — Ambulatory Visit: Payer: BLUE CROSS/BLUE SHIELD | Admitting: Physical Medicine & Rehabilitation

## 2011-12-22 ENCOUNTER — Encounter: Payer: Medicare Other | Attending: Physical Medicine & Rehabilitation

## 2011-12-22 ENCOUNTER — Ambulatory Visit (HOSPITAL_BASED_OUTPATIENT_CLINIC_OR_DEPARTMENT_OTHER): Payer: Medicare Other | Admitting: Physical Medicine & Rehabilitation

## 2011-12-22 ENCOUNTER — Encounter: Payer: Self-pay | Admitting: Physical Medicine & Rehabilitation

## 2011-12-22 VITALS — BP 117/83 | HR 91 | Resp 16 | Ht 72.0 in | Wt 263.0 lb

## 2011-12-22 DIAGNOSIS — M545 Low back pain, unspecified: Secondary | ICD-10-CM

## 2011-12-22 DIAGNOSIS — I69959 Hemiplegia and hemiparesis following unspecified cerebrovascular disease affecting unspecified side: Secondary | ICD-10-CM | POA: Insufficient documentation

## 2011-12-22 DIAGNOSIS — L84 Corns and callosities: Secondary | ICD-10-CM | POA: Diagnosis not present

## 2011-12-22 DIAGNOSIS — R42 Dizziness and giddiness: Secondary | ICD-10-CM | POA: Diagnosis not present

## 2011-12-22 DIAGNOSIS — R209 Unspecified disturbances of skin sensation: Secondary | ICD-10-CM | POA: Insufficient documentation

## 2011-12-22 DIAGNOSIS — G8110 Spastic hemiplegia affecting unspecified side: Secondary | ICD-10-CM | POA: Diagnosis not present

## 2011-12-22 DIAGNOSIS — L608 Other nail disorders: Secondary | ICD-10-CM | POA: Diagnosis not present

## 2011-12-22 DIAGNOSIS — I739 Peripheral vascular disease, unspecified: Secondary | ICD-10-CM | POA: Diagnosis not present

## 2011-12-22 NOTE — Progress Notes (Signed)
Subjective:    Patient ID: [name], male    DOB: [birth year], 66 y.o.   MRN: [id number]  HPI Back pain is much better. Spasms in the left arm and left leg are well controlled Pain Inventory Average Pain 7 Pain Right Now 7 My pain is tingling  In the last 24 hours, has pain interfered with the following? General activity 7 Relation with others 9 Enjoyment of life 9 What TIME of day is your pain at its worst? daytime Sleep (in general) Fair  Pain is worse with: walking and standing Pain improves with: rest Relief from Meds: 0  Mobility walk without assistance ability to climb steps?  yes do you drive?  yes  Function retired  Neuro/Psych No problems in this area  Prior Studies Any changes since last visit?  no  Physicians involved in your care Any changes since last visit?  no   Family History  Problem Relation Age of Onset  . Lung cancer Father     deceased  . Stroke Mother     deceased-MINISTROKES   History   Social History  . Marital Status: Married    Spouse Name: [name]    Number of Children: N/A  . Years of Education: N/A   Occupational History  . retired Web designer   Social History Main Topics  . Smoking status: Never Smoker   . Smokeless tobacco: Never Used  . Alcohol Use: No  . Drug Use: No  . Sexually Active: Yes   Other Topics Concern  . None   Social History Narrative  . None   Past Surgical History  Procedure Date  . Coronary artery bypass graft     stent  . Esophagogastroduodenoscopy 03-25-2005  . Nasal septum surgery   . Percutaneous placement intravascular stent cervical carotid artery     03-2009; using a drug-eluting platform of the circumflex cornoray artery with a 3.0 x 18 [employer] Promus drug-eluting platform post dilated to 3.75 with a noncompliant balloon.  . Cataract extraction 05/2010    left eye  . Cataract extraction 04/2010    right eye  . Cardioversion 06/20/2011    Procedure:  CARDIOVERSION;  Surgeon: Laurey Morale, MD;  Location: [facility] ENDOSCOPY;  Service: Cardiovascular;  Laterality: N/A;  . Tee without cardioversion 06/20/2011    Procedure: TRANSESOPHAGEAL ECHOCARDIOGRAM (TEE);  Surgeon: Laurey Morale, MD;  Location: [facility] ENDOSCOPY;  Service: Cardiovascular;  Laterality: N/A;   Past Medical History  Diagnosis Date  . GERD (gastroesophageal reflux disease)   . Gout   . Hyperlipidemia   . Hypertension   . CVA (cerebral infarction)     left hemiparesis  . Chronic pain     left sided-Kristeins  . CAD (coronary artery disease)     s/p CABG, s/p DES to LCX  January 2011  . OSA on CPAP   . Sleep apnea   . History of colonoscopy    BP 117/83  Pulse 91  Resp 16  Ht 6' (1.829 m)  Wt 263 lb (119.296 kg)  BMI 35.67 kg/m2  SpO2 94%      Review of Systems  Constitutional: Negative.   HENT: Negative.   Eyes: Negative.   Respiratory: Negative.   Cardiovascular: Negative.   Gastrointestinal: Negative.   Genitourinary: Negative.   Musculoskeletal: Positive for back pain.  Skin: Negative.   Neurological: Negative.   Hematological: Negative.   Psychiatric/Behavioral: Negative.        Objective:  Physical Exam  Constitutional: He is oriented to person, place, and time. He appears well-developed.  HENT:  Head: Normocephalic and atraumatic.  Musculoskeletal:  Lumbar back: He exhibits decreased range of motion and pain. He exhibits no tenderness.  Negative straight leg raising test Pain with lumbar extension in the low back  Neurological: He is alert and oriented to person, place, and time. He exhibits abnormal muscle tone. Coordination and gait abnormal.  Reflex Scores:  Tricep reflexes are 2+ on the right side and 3+ on the left side.  Bicep reflexes are 2+ on the right side and 3+ on the left side.  Brachioradialis reflexes are 2+ on the right side and 3+ on the left side.  Patellar reflexes are 2+ on the right side and 3+ on the left side.    Achilles reflexes are 1+ on the right side and 1+ on the left side. Normal strength and sensation on the right side reduced strength on the left side graded as 3 minus/5 in the upper extremity and 4 minus in the lower extremity. There is decreased fine motor coordination left hand. Ambulates with a stifflegged gait and has hip hiking to clear his left toes  Psychiatric: He has a normal mood and affect.        Assessment & Plan:  1. Acute low back pain likely facet syndrome. Much improved after her Medrol Dosepak. He now uses a lumbar corset when he mows the lawnHe is to call my office and we can set up an x-ray and then some medial branch blocks. I will schedule back 2 mo For repeat Botox injection 200 units to the left hamstrings and 100 units of left wrist and finger flexors

## 2011-12-22 NOTE — Patient Instructions (Signed)
Back Exercises  These exercises may help you when beginning to rehabilitate your injury. Your symptoms may resolve with or without further involvement from your physician, physical therapist or athletic trainer. While completing these exercises, remember:   · Restoring tissue flexibility helps normal motion to return to the joints. This allows healthier, less painful movement and activity.  · An effective stretch should be held for at least 30 seconds.  · A stretch should never be painful. You should only feel a gentle lengthening or release in the stretched tissue.  STRETCH - Extension, Prone on Elbows   · Lie on your stomach on the floor, a bed will be too soft. Place your palms about shoulder width apart and at the height of your head.  · Place your elbows under your shoulders. If this is too painful, stack pillows under your chest.  · Allow your body to relax so that your hips drop lower and make contact more completely with the floor.  · Hold this position for __________ seconds.  · Slowly return to lying flat on the floor.  Repeat __________ times. Complete this exercise __________ times per day.   RANGE OF MOTION - Extension, Prone Press Ups   · Lie on your stomach on the floor, a bed will be too soft. Place your palms about shoulder width apart and at the height of your head.  · Keeping your back as relaxed as possible, slowly straighten your elbows while keeping your hips on the floor. You may adjust the placement of your hands to maximize your comfort. As you gain motion, your hands will come more underneath your shoulders.  · Hold this position __________ seconds.  · Slowly return to lying flat on the floor.  Repeat __________ times. Complete this exercise __________ times per day.   RANGE OF MOTION- Quadruped, Neutral Spine   · Assume a hands and knees position on a firm surface. Keep your hands under your shoulders and your knees under your hips. You may place padding under your knees for  comfort.  · Drop your head and point your tail bone toward the ground below you. This will round out your low back like an angry cat. Hold this position for __________ seconds.  · Slowly lift your head and release your tail bone so that your back sags into a large arch, like an old horse.  · Hold this position for __________ seconds.  · Repeat this until you feel limber in your low back.  · Now, find your "sweet spot." This will be the most comfortable position somewhere between the two previous positions. This is your neutral spine. Once you have found this position, tense your stomach muscles to support your low back.  · Hold this position for __________ seconds.  Repeat __________ times. Complete this exercise __________ times per day.   STRETCH - Flexion, Single Knee to Chest   · Lie on a firm bed or floor with both legs extended in front of you.  · Keeping one leg in contact with the floor, bring your opposite knee to your chest. Hold your leg in place by either grabbing behind your thigh or at your knee.  · Pull until you feel a gentle stretch in your low back. Hold __________ seconds.  · Slowly release your grasp and repeat the exercise with the opposite side.  Repeat __________ times. Complete this exercise __________ times per day.   STRETCH - Hamstrings, Standing  · Stand or sit and extend   your right / left leg, placing your foot on a chair or foot stool  · Keeping a slight arch in your low back and your hips straight forward.  · Lead with your chest and lean forward at the waist until you feel a gentle stretch in the back of your right / left knee or thigh. (When done correctly, this exercise requires leaning only a small distance.)  · Hold this position for __________ seconds.  Repeat __________ times. Complete this stretch __________ times per day.  STRENGTHENING - Deep Abdominals, Pelvic Tilt   · Lie on a firm bed or floor. Keeping your legs in front of you, bend your knees so they are both pointed  toward the ceiling and your feet are flat on the floor.  · Tense your lower abdominal muscles to press your low back into the floor. This motion will rotate your pelvis so that your tail bone is scooping upwards rather than pointing at your feet or into the floor.  · With a gentle tension and even breathing, hold this position for __________ seconds.  Repeat __________ times. Complete this exercise __________ times per day.   STRENGTHENING - Abdominals, Crunches   · Lie on a firm bed or floor. Keeping your legs in front of you, bend your knees so they are both pointed toward the ceiling and your feet are flat on the floor. Cross your arms over your chest.  · Slightly tip your chin down without bending your neck.  · Tense your abdominals and slowly lift your trunk high enough to just clear your shoulder blades. Lifting higher can put excessive stress on the low back and does not further strengthen your abdominal muscles.  · Control your return to the starting position.  Repeat __________ times. Complete this exercise __________ times per day.   STRENGTHENING - Quadruped, Opposite UE/LE Lift   · Assume a hands and knees position on a firm surface. Keep your hands under your shoulders and your knees under your hips. You may place padding under your knees for comfort.  · Find your neutral spine and gently tense your abdominal muscles so that you can maintain this position. Your shoulders and hips should form a rectangle that is parallel with the floor and is not twisted.  · Keeping your trunk steady, lift your right hand no higher than your shoulder and then your left leg no higher than your hip. Make sure you are not holding your breath. Hold this position __________ seconds.  · Continuing to keep your abdominal muscles tense and your back steady, slowly return to your starting position. Repeat with the opposite arm and leg.  Repeat __________ times. Complete this exercise __________ times per day.  Document Released:  03/21/2005 Document Revised: 05/26/2011 Document Reviewed: 06/15/2008  [employer]® Patient Information ©2013 [employer].

## 2011-12-30 ENCOUNTER — Telehealth: Payer: Self-pay | Admitting: *Deleted

## 2011-12-30 NOTE — Telephone Encounter (Signed)
Pt returned call from 10.14.13. Please advise.

## 2012-01-01 NOTE — Telephone Encounter (Signed)
Pt returned call and was notified. Pt voices understanding.  Notes Recorded by Kathi Simpers, CMA on 12/30/2011 at 3:13 PM Left message for pt to return my call. ------  Notes Recorded by [name], MD on 12/30/2011 at 10:53 AM Keep dose same. Use daily. Same labs added to next visit

## 2012-01-16 ENCOUNTER — Other Ambulatory Visit: Payer: Self-pay | Admitting: Internal Medicine

## 2012-01-16 NOTE — Telephone Encounter (Signed)
Denied current request for pantoprazole as refills were just sent on 12/11/11 #90 x 3 refills.

## 2012-01-19 ENCOUNTER — Ambulatory Visit (INDEPENDENT_AMBULATORY_CARE_PROVIDER_SITE_OTHER): Payer: Medicare Other

## 2012-01-19 DIAGNOSIS — Z7901 Long term (current) use of anticoagulants: Secondary | ICD-10-CM

## 2012-01-19 DIAGNOSIS — I4891 Unspecified atrial fibrillation: Secondary | ICD-10-CM

## 2012-01-19 LAB — POCT INR: INR: 1.9

## 2012-01-28 ENCOUNTER — Other Ambulatory Visit: Payer: Self-pay | Admitting: Internal Medicine

## 2012-01-29 NOTE — Telephone Encounter (Signed)
Metformin request denied as pt should still have refills on file from 12/11/11, #90 x 3 refills.  Medication name:  Name from pharmacy:  metFORMIN (GLUCOPHAGE) 500 MG tablet  METFORMIN HCL 500 MG TABLET The source prescription has been discontinued. Sig: TAKE 1 TABLET (500 MG TOTAL) BY MOUTH DAILY WITH BREAKFAST. Dispense: 30 tablet Refills: 5 Start: 01/28/2012 Class: Normal Notes to pharmacy: CUSTOMER NEEDS REFILLS PLEASE. THANK YOU WANTS 3 MONTH SUPPLY Requested on: 11/26/2010 Originally ordered on: 11/26/2010 Last refill: 11/26/2011

## 2012-02-15 ENCOUNTER — Other Ambulatory Visit: Payer: Self-pay | Admitting: Cardiology

## 2012-02-16 ENCOUNTER — Ambulatory Visit (INDEPENDENT_AMBULATORY_CARE_PROVIDER_SITE_OTHER): Payer: Medicare Other | Admitting: *Deleted

## 2012-02-16 DIAGNOSIS — Z7901 Long term (current) use of anticoagulants: Secondary | ICD-10-CM | POA: Diagnosis not present

## 2012-02-16 DIAGNOSIS — I4891 Unspecified atrial fibrillation: Secondary | ICD-10-CM | POA: Diagnosis not present

## 2012-02-16 LAB — POCT INR: INR: 2

## 2012-02-24 ENCOUNTER — Ambulatory Visit (HOSPITAL_BASED_OUTPATIENT_CLINIC_OR_DEPARTMENT_OTHER): Payer: Medicare Other | Admitting: Physical Medicine & Rehabilitation

## 2012-02-24 ENCOUNTER — Encounter: Payer: Medicare Other | Attending: Physical Medicine & Rehabilitation

## 2012-02-24 ENCOUNTER — Encounter: Payer: Self-pay | Admitting: Physical Medicine & Rehabilitation

## 2012-02-24 VITALS — BP 167/75 | HR 83 | Resp 14 | Ht 75.0 in | Wt 268.0 lb

## 2012-02-24 DIAGNOSIS — G8110 Spastic hemiplegia affecting unspecified side: Secondary | ICD-10-CM

## 2012-02-24 DIAGNOSIS — R209 Unspecified disturbances of skin sensation: Secondary | ICD-10-CM | POA: Insufficient documentation

## 2012-02-24 DIAGNOSIS — R42 Dizziness and giddiness: Secondary | ICD-10-CM | POA: Diagnosis not present

## 2012-02-24 DIAGNOSIS — I69959 Hemiplegia and hemiparesis following unspecified cerebrovascular disease affecting unspecified side: Secondary | ICD-10-CM | POA: Insufficient documentation

## 2012-02-24 NOTE — Patient Instructions (Signed)
See me in one month.

## 2012-02-24 NOTE — Progress Notes (Signed)
Botox Injection for spasticity using needle EMG guidance  Dilution: 50 Units/ml  Indication: Severe spasticity which interferes with ADL,mobility and/or hygiene and is unresponsive to medication management and other conservative care  Informed consent was obtained after describing risks and benefits of the procedure with the patient. This includes bleeding, bruising, infection, excessive weakness, or medication side effects. A REMS form is on file and signed.  Needle: 26g,2" needle electrode  Number of units per muscle  Pectoralis0  Biceps0  FCR50  FCU0  FDS50  FDP0  Gastrosoleus0  Hamstrings 200   All injections were done after obtaining appropriate EMG activity and after negative drawback for blood. The patient tolerated the procedure well. Post procedure instructions were given. A followup appointment was made.

## 2012-03-05 ENCOUNTER — Ambulatory Visit (INDEPENDENT_AMBULATORY_CARE_PROVIDER_SITE_OTHER): Payer: Medicare Other | Admitting: Internal Medicine

## 2012-03-05 ENCOUNTER — Encounter: Payer: Self-pay | Admitting: Internal Medicine

## 2012-03-05 VITALS — BP 136/78 | HR 61 | Temp 97.9°F | Resp 16 | Wt 270.5 lb

## 2012-03-05 DIAGNOSIS — Z23 Encounter for immunization: Secondary | ICD-10-CM

## 2012-03-05 DIAGNOSIS — J069 Acute upper respiratory infection, unspecified: Secondary | ICD-10-CM

## 2012-03-05 NOTE — Progress Notes (Signed)
  Subjective:    Patient ID: [name], male    DOB: 06-04-1945, 66 y.o.   MRN: [id number]  HPI Pt presents to clinic for evaluation of URI sx's. Notes 2 day h/o myalgias, fatigue, sinus congestion/drainage and intermittent chills. No sick exposures. Has not received flu vaccine for the season. No alleviating or exacerbating factors.  Past Medical History  Diagnosis Date  . GERD (gastroesophageal reflux disease)   . Gout   . Hyperlipidemia   . Hypertension   . CVA (cerebral infarction)     left hemiparesis  . Chronic pain     left sided-Kristeins  . CAD (coronary artery disease)     s/p CABG, s/p DES to LCX  January 2011  . OSA on CPAP   . Sleep apnea   . History of colonoscopy    Past Surgical History  Procedure Date  . Coronary artery bypass graft     stent  . Esophagogastroduodenoscopy 03-25-2005  . Nasal septum surgery   . Percutaneous placement intravascular stent cervical carotid artery     03-2009; using a drug-eluting platform of the circumflex cornoray artery with a 3.0 x 18 [employer] Promus drug-eluting platform post dilated to 3.75 with a noncompliant balloon.  . Cataract extraction 05/2010    left eye  . Cataract extraction 04/2010    right eye  . Cardioversion 06/20/2011    Procedure: CARDIOVERSION;  Surgeon: Laurey Morale, MD;  Location: [facility] ENDOSCOPY;  Service: Cardiovascular;  Laterality: N/A;  . Tee without cardioversion 06/20/2011    Procedure: TRANSESOPHAGEAL ECHOCARDIOGRAM (TEE);  Surgeon: Laurey Morale, MD;  Location: [facility] ENDOSCOPY;  Service: Cardiovascular;  Laterality: N/A;    reports that he has never smoked. He has never used smokeless tobacco. He reports that he does not drink alcohol or use illicit drugs. family history includes Lung cancer in his father and Stroke in his mother. No Known Allergies   Review of Systems see hpi    Objective:   Physical Exam  Nursing note and vitals reviewed. Constitutional: He appears well-developed and  well-nourished. No distress.  HENT:  Head: Normocephalic and atraumatic.  Right Ear: External ear normal.  Left Ear: External ear normal.  Nose: Nose normal.  Mouth/Throat: Oropharynx is clear and moist. No oropharyngeal exudate.  Eyes: Conjunctivae normal are normal. No scleral icterus.  Neck: Neck supple.  Cardiovascular: Normal rate.  An irregularly irregular rhythm present.  Pulmonary/Chest: Effort normal and breath sounds normal. No respiratory distress. He has no wheezes. He has no rales.  Neurological: He is alert.  Skin: Skin is warm and dry. He is not diaphoretic.  Psychiatric: He has a normal mood and affect.          Assessment & Plan:

## 2012-03-05 NOTE — Assessment & Plan Note (Signed)
Nasal flu swab neg. Suspect viral etiology. Attempt tylenol and mucinex prn. Avoid decongestants. Flu vaccine administered. Followup if no improvement or worsening.

## 2012-03-15 ENCOUNTER — Ambulatory Visit (INDEPENDENT_AMBULATORY_CARE_PROVIDER_SITE_OTHER): Payer: Medicare Other | Admitting: Pharmacist

## 2012-03-15 DIAGNOSIS — I4891 Unspecified atrial fibrillation: Secondary | ICD-10-CM | POA: Diagnosis not present

## 2012-03-15 DIAGNOSIS — Z7901 Long term (current) use of anticoagulants: Secondary | ICD-10-CM | POA: Diagnosis not present

## 2012-03-16 ENCOUNTER — Telehealth: Payer: Self-pay | Admitting: Internal Medicine

## 2012-03-16 MED ORDER — DOXYCYCLINE HYCLATE 100 MG PO TABS: 100.0000 mg | ORAL_TABLET | Freq: Two times a day (BID) | ORAL | Status: DC

## 2012-03-16 NOTE — Telephone Encounter (Signed)
Patient Information:  Caller Name: Ayeden  Phone: [phone]  Patient: Earle, Troiano  Gender: Male  DOB: 1945-10-29  Age: 66 Years  PCP: Marguarite Arbour (Adults only)  Office Follow Up:  Does the office need to follow up with this patient?: Yes  Instructions For The Office: Worsening sx of blocked sinuses, facial tenderness and stomachaches. Requesting meds (steroid and antibiotic)  RN Note:  He is requesting steroid or antibiotic be called into [facility] on [address] since he has already been checked in the office.  Symptoms  Reason For Call & Symptoms: Seen in office on 03/05/12 for Congestion/Cold sx. Checked for flu- negative. Having drainage in throat and getting stomachaches and stomach feels queasy- having loose stools. Uses O2 2L/m at night. L side of sinuses are blocked and has tenderness on L side of nose and up to the L forehead area. Using Saline spray but only able to clear small amount of yellow mucos since sinuses so blocked. Requesting Steroid pack/antibiotic to treat. Had called earlier in the day. Message sent to MD  Reviewed Health History In EMR: Yes  Reviewed Medications In EMR: Yes  Reviewed Allergies In EMR: Yes  Reviewed Surgeries / Procedures: Yes  Date of Onset of Symptoms: 03/05/2012  Treatments Tried: Tylenol and Muscinex  Treatments Tried Worked: No  Guideline(s) Used:  Colds  Disposition Per Guideline:   See Today or Tomorrow in Office  Reason For Disposition Reached:   Sinus congestion (pressure, fullness) present > 10 days  Advice Given:  For a Stuffy Nose - Use Nasal Washes:  How to Make Saline [facility] Water) Nasal Wash :  You can make your own saline nasal wash.  Add 1/2 tsp of table salt to 1 cup (8 oz; 240 ml) of warm water.  You should use sterile, distilled, or previously boiled water for nasal irrigation.  Treatment for Associated Symptoms of Colds:  Hydrate: Drink adequate liquids.  Humidifier:  If the air in your home is dry, use a  cool-mist humidifier  Patient Refused Recommendation:  Patient Refused Care Advice  Requesting medication be called to pharmacy. Not wanting to come back in for appointment since already checked.

## 2012-03-16 NOTE — Telephone Encounter (Signed)
Patient called again regarding this.

## 2012-03-16 NOTE — Telephone Encounter (Signed)
Ok to begin doxycycline 100mg  bid x7 d if not allergic and no interactions. pls notify coumadin clinic if begins.

## 2012-03-16 NOTE — Telephone Encounter (Signed)
Patient informed, new Rx to pharmacy; spoke w/Tiffany in Coumadin Clinic to inform/SLS

## 2012-03-16 NOTE — Telephone Encounter (Signed)
PATIENT IS STILL CONGESTED AND HAVING DRAINAGE DOWN HIS THROAT.  iT IS MAKING HIS STOMACH UPSET AS WELL.  HE WOULD LIKE AN RX FOR A 6 DAY STEP DOWN OF STEROID CALLED [facility] TO [facility] [address]

## 2012-03-16 NOTE — Telephone Encounter (Signed)
Message has been forwarded to MD/SLS

## 2012-03-16 NOTE — Telephone Encounter (Signed)
Per Tiffany, could we please check pt's PT/INR at his 01.06.13 OV, will place orders/SLS

## 2012-03-22 ENCOUNTER — Ambulatory Visit (INDEPENDENT_AMBULATORY_CARE_PROVIDER_SITE_OTHER): Payer: Medicare Other | Admitting: Internal Medicine

## 2012-03-22 ENCOUNTER — Encounter: Payer: Self-pay | Admitting: Internal Medicine

## 2012-03-22 VITALS — BP 136/72 | HR 70 | Temp 97.8°F | Resp 16 | Ht 75.0 in | Wt 268.1 lb

## 2012-03-22 DIAGNOSIS — J069 Acute upper respiratory infection, unspecified: Secondary | ICD-10-CM

## 2012-03-22 DIAGNOSIS — Z7901 Long term (current) use of anticoagulants: Secondary | ICD-10-CM | POA: Diagnosis not present

## 2012-03-22 DIAGNOSIS — R7309 Other abnormal glucose: Secondary | ICD-10-CM

## 2012-03-22 DIAGNOSIS — E119 Type 2 diabetes mellitus without complications: Secondary | ICD-10-CM

## 2012-03-22 LAB — HEMOGLOBIN A1C: Hgb A1c MFr Bld: 6.4 % — ABNORMAL HIGH (ref <5.7)

## 2012-03-22 LAB — PROTIME-INR
INR: 2.08 — ABNORMAL HIGH (ref <1.50)
Prothrombin Time: 22.7 seconds — ABNORMAL HIGH (ref 11.6–15.2)

## 2012-03-22 LAB — BASIC METABOLIC PANEL
BUN: 25 mg/dL — ABNORMAL HIGH (ref 6–23)
Glucose, Bld: 104 mg/dL — ABNORMAL HIGH (ref 70–99)
Potassium: 4.4 mEq/L (ref 3.5–5.3)

## 2012-03-22 NOTE — Patient Instructions (Signed)
Please schedule fasting labs prior to next visit Cbc, chem7, a1c-250.00, tsh/free t4-hypothyroidism, and lipid/lft-272.4

## 2012-03-22 NOTE — Assessment & Plan Note (Signed)
Completing abx. Sx's resolving. Obtain PT/INR today

## 2012-03-22 NOTE — Telephone Encounter (Signed)
Lab Done at 01.06.14 OV/SLS

## 2012-03-22 NOTE — Assessment & Plan Note (Signed)
Obtain chem7, a1c and urine microalbumin 

## 2012-03-22 NOTE — Progress Notes (Signed)
  Subjective:    Patient ID: [name], male    DOB: 09/05/1945, 67 y.o.   MRN: [id number]  HPI Pt presents to clinic for followup of multiple medical problems. Has had recent uri and began abx 12/31 with subsequent improvement. States sx's were mainly nasal passages/sinuses and are almost gone. No gross active bleeding with coumadin.  Past Medical History  Diagnosis Date  . GERD (gastroesophageal reflux disease)   . Gout   . Hyperlipidemia   . Hypertension   . CVA (cerebral infarction)     left hemiparesis  . Chronic pain     left sided-Kristeins  . CAD (coronary artery disease)     s/p CABG, s/p DES to LCX  January 2011  . OSA on CPAP   . Sleep apnea   . History of colonoscopy    Past Surgical History  Procedure Date  . Coronary artery bypass graft     stent  . Esophagogastroduodenoscopy 03-25-2005  . Nasal septum surgery   . Percutaneous placement intravascular stent cervical carotid artery     03-2009; using a drug-eluting platform of the circumflex cornoray artery with a 3.0 x 18 [employer] Promus drug-eluting platform post dilated to 3.75 with a noncompliant balloon.  . Cataract extraction 05/2010    left eye  . Cataract extraction 04/2010    right eye  . Cardioversion 06/20/2011    Procedure: CARDIOVERSION;  Surgeon: Laurey Morale, MD;  Location: [facility] ENDOSCOPY;  Service: Cardiovascular;  Laterality: N/A;  . Tee without cardioversion 06/20/2011    Procedure: TRANSESOPHAGEAL ECHOCARDIOGRAM (TEE);  Surgeon: Laurey Morale, MD;  Location: [facility] ENDOSCOPY;  Service: Cardiovascular;  Laterality: N/A;    reports that he has never smoked. He has never used smokeless tobacco. He reports that he does not drink alcohol or use illicit drugs. family history includes Lung cancer in his father and Stroke in his mother. No Known Allergies    Review of Systems see hpi     Objective:   Physical Exam  Nursing note and vitals reviewed. Constitutional: He appears well-developed  and well-nourished. No distress.  HENT:  Head: Normocephalic and atraumatic.  Right Ear: External ear normal.  Left Ear: External ear normal.  Eyes: Conjunctivae normal are normal. No scleral icterus.  Neck: Neck supple.  Pulmonary/Chest: Effort normal and breath sounds normal. No respiratory distress. He has no wheezes. He has no rales.  Neurological: He is alert.  Skin: He is not diaphoretic.  Psychiatric: He has a normal mood and affect.          Assessment & Plan:

## 2012-03-23 ENCOUNTER — Ambulatory Visit: Payer: Self-pay | Admitting: Cardiology

## 2012-03-23 DIAGNOSIS — Z7901 Long term (current) use of anticoagulants: Secondary | ICD-10-CM

## 2012-03-23 DIAGNOSIS — I4891 Unspecified atrial fibrillation: Secondary | ICD-10-CM

## 2012-03-23 LAB — MICROALBUMIN / CREATININE URINE RATIO
Creatinine, Urine: 200.1 mg/dL
Microalb Creat Ratio: 2.5 mg/g (ref 0.0–30.0)
Microalb, Ur: 0.5 mg/dL (ref 0.00–1.89)

## 2012-03-25 DIAGNOSIS — I739 Peripheral vascular disease, unspecified: Secondary | ICD-10-CM | POA: Diagnosis not present

## 2012-03-25 DIAGNOSIS — L608 Other nail disorders: Secondary | ICD-10-CM | POA: Diagnosis not present

## 2012-03-25 DIAGNOSIS — L84 Corns and callosities: Secondary | ICD-10-CM | POA: Diagnosis not present

## 2012-03-26 ENCOUNTER — Ambulatory Visit (HOSPITAL_BASED_OUTPATIENT_CLINIC_OR_DEPARTMENT_OTHER): Payer: Medicare Other | Admitting: Physical Medicine & Rehabilitation

## 2012-03-26 ENCOUNTER — Encounter: Payer: Medicare Other | Attending: Physical Medicine & Rehabilitation

## 2012-03-26 ENCOUNTER — Encounter: Payer: Self-pay | Admitting: Physical Medicine & Rehabilitation

## 2012-03-26 VITALS — BP 144/82 | HR 67 | Resp 14 | Ht 75.0 in | Wt 270.0 lb

## 2012-03-26 DIAGNOSIS — M76899 Other specified enthesopathies of unspecified lower limb, excluding foot: Secondary | ICD-10-CM | POA: Diagnosis not present

## 2012-03-26 DIAGNOSIS — G8110 Spastic hemiplegia affecting unspecified side: Secondary | ICD-10-CM | POA: Diagnosis not present

## 2012-03-26 DIAGNOSIS — M778 Other enthesopathies, not elsewhere classified: Secondary | ICD-10-CM | POA: Diagnosis not present

## 2012-03-26 DIAGNOSIS — I69959 Hemiplegia and hemiparesis following unspecified cerebrovascular disease affecting unspecified side: Secondary | ICD-10-CM | POA: Insufficient documentation

## 2012-03-26 DIAGNOSIS — R42 Dizziness and giddiness: Secondary | ICD-10-CM | POA: Diagnosis not present

## 2012-03-26 DIAGNOSIS — R209 Unspecified disturbances of skin sensation: Secondary | ICD-10-CM | POA: Diagnosis not present

## 2012-03-26 DIAGNOSIS — M7062 Trochanteric bursitis, left hip: Secondary | ICD-10-CM

## 2012-03-26 NOTE — Progress Notes (Signed)
Subjective:    Patient ID: [name], male    DOB: [birth year], 67 y.o.   MRN: [id number]  HPI Wrist spasticity on left is better after botox L hamstrings pain and tightness improved as well Pain Inventory Average Pain 6 Pain Right Now 6 My pain is tingling and aching  In the last 24 hours, has pain interfered with the following? General activity 6 Relation with others 5 Enjoyment of life 7 What TIME of day is your pain at its worst? unsure Sleep (in general) Fair  Pain is worse with: walking and some activites Pain improves with: medication Relief from Meds: 7  Mobility walk without assistance how many minutes can you walk? 30 ability to climb steps?  yes do you drive?  yes Do you have any goals in this area?  no  Function retired  Neuro/Psych No problems in this area  Prior Studies Any changes since last visit?  no  Physicians involved in your care Any changes since last visit?  no   Family History  Problem Relation Age of Onset  . Lung cancer Father     deceased  . Stroke Mother     deceased-MINISTROKES   History   Social History  . Marital Status: Married    Spouse Name: [name]    Number of Children: N/A  . Years of Education: N/A   Occupational History  . retired Web designer   Social History Main Topics  . Smoking status: Never Smoker   . Smokeless tobacco: Never Used  . Alcohol Use: No  . Drug Use: No  . Sexually Active: Yes   Other Topics Concern  . None   Social History Narrative  . None   Past Surgical History  Procedure Date  . Coronary artery bypass graft     stent  . Esophagogastroduodenoscopy 03-25-2005  . Nasal septum surgery   . Percutaneous placement intravascular stent cervical carotid artery     03-2009; using a drug-eluting platform of the circumflex cornoray artery with a 3.0 x 18 [employer] Promus drug-eluting platform post dilated to 3.75 with a noncompliant balloon.  . Cataract extraction  05/2010    left eye  . Cataract extraction 04/2010    right eye  . Cardioversion 06/20/2011    Procedure: CARDIOVERSION;  Surgeon: Laurey Morale, MD;  Location: [facility] ENDOSCOPY;  Service: Cardiovascular;  Laterality: N/A;  . Tee without cardioversion 06/20/2011    Procedure: TRANSESOPHAGEAL ECHOCARDIOGRAM (TEE);  Surgeon: Laurey Morale, MD;  Location: [facility] ENDOSCOPY;  Service: Cardiovascular;  Laterality: N/A;   Past Medical History  Diagnosis Date  . GERD (gastroesophageal reflux disease)   . Gout   . Hyperlipidemia   . Hypertension   . CVA (cerebral infarction)     left hemiparesis  . Chronic pain     left sided-Kristeins  . CAD (coronary artery disease)     s/p CABG, s/p DES to LCX  January 2011  . OSA on CPAP   . Sleep apnea   . History of colonoscopy    BP 144/82  Pulse 67  Resp 14  Ht 6\' 3"  (1.905 m)  Wt 270 lb (122.471 kg)  BMI 33.75 kg/m2  SpO2 94%    Review of Systems  Musculoskeletal: Positive for gait problem.  All other systems reviewed and are negative.       Objective:   Physical Exam Ashworth 1 in Left finger and wrist flexors Ashworth 1  Spasticity left  hamstrings  Left triceps insertion site tender left proximal deltoid tender Left lateral hip at the trochanteric bursa tender     Assessment & Plan:  1.  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 Will need repeat injection in 2 more months.  2. Bursitis and tendinitis left triceps left deltoid and left greater trochanter of the hip. Will trial compounded cream which includes amantadine 8% diclofenac 3% baclofen 2% cyclobenzaprine 2% gabapentin 10% and bupivacaine 2%. No allergies.

## 2012-04-13 ENCOUNTER — Other Ambulatory Visit: Payer: Self-pay | Admitting: Internal Medicine

## 2012-04-14 ENCOUNTER — Other Ambulatory Visit: Payer: Self-pay | Admitting: Cardiology

## 2012-04-14 ENCOUNTER — Other Ambulatory Visit: Payer: Self-pay | Admitting: Internal Medicine

## 2012-04-15 NOTE — Telephone Encounter (Signed)
Refill sent for Crestor #90 x 1 refill. Denial sent to pharmacy for: pantoprazole, furosemide and benazepril to use refills on file from 12/11/11 and 11/21/11.

## 2012-04-19 ENCOUNTER — Ambulatory Visit (INDEPENDENT_AMBULATORY_CARE_PROVIDER_SITE_OTHER): Payer: Medicare Other

## 2012-04-19 DIAGNOSIS — Z7901 Long term (current) use of anticoagulants: Secondary | ICD-10-CM

## 2012-04-19 DIAGNOSIS — I4891 Unspecified atrial fibrillation: Secondary | ICD-10-CM

## 2012-05-06 ENCOUNTER — Other Ambulatory Visit: Payer: Self-pay | Admitting: Internal Medicine

## 2012-05-10 ENCOUNTER — Other Ambulatory Visit: Payer: Self-pay | Admitting: Internal Medicine

## 2012-05-10 NOTE — Telephone Encounter (Signed)
eScribe request for refill on BENAZEPRIL Last filled - 10/16/11, #90 X 1 Last seen on - 03/22/12 Follow up - 06/25/12 RX sent per protocol.

## 2012-05-17 ENCOUNTER — Ambulatory Visit (INDEPENDENT_AMBULATORY_CARE_PROVIDER_SITE_OTHER): Payer: Medicare Other | Admitting: *Deleted

## 2012-05-17 DIAGNOSIS — I4891 Unspecified atrial fibrillation: Secondary | ICD-10-CM

## 2012-05-17 DIAGNOSIS — Z7901 Long term (current) use of anticoagulants: Secondary | ICD-10-CM | POA: Diagnosis not present

## 2012-05-24 ENCOUNTER — Encounter: Payer: Self-pay | Admitting: Physical Medicine & Rehabilitation

## 2012-05-24 ENCOUNTER — Ambulatory Visit (HOSPITAL_BASED_OUTPATIENT_CLINIC_OR_DEPARTMENT_OTHER): Payer: Medicare Other | Admitting: Physical Medicine & Rehabilitation

## 2012-05-24 ENCOUNTER — Encounter: Payer: Medicare Other | Attending: Physical Medicine & Rehabilitation

## 2012-05-24 VITALS — BP 140/71 | HR 74 | Resp 14 | Ht 75.0 in | Wt 274.0 lb

## 2012-05-24 DIAGNOSIS — G8110 Spastic hemiplegia affecting unspecified side: Secondary | ICD-10-CM | POA: Diagnosis not present

## 2012-05-24 DIAGNOSIS — I69959 Hemiplegia and hemiparesis following unspecified cerebrovascular disease affecting unspecified side: Secondary | ICD-10-CM | POA: Diagnosis not present

## 2012-05-24 DIAGNOSIS — R209 Unspecified disturbances of skin sensation: Secondary | ICD-10-CM | POA: Diagnosis not present

## 2012-05-24 DIAGNOSIS — R42 Dizziness and giddiness: Secondary | ICD-10-CM | POA: Insufficient documentation

## 2012-05-24 NOTE — Progress Notes (Signed)
Botox Injection for spasticity using needle EMG guidance  Dilution: 50 Units/ml  Indication: Severe spasticity which interferes with ADL,mobility and/or hygiene and is unresponsive to medication management and other conservative care  Informed consent was obtained after describing risks and benefits of the procedure with the patient. This includes bleeding, bruising, infection, excessive weakness, or medication side effects. A REMS form is on file and signed.  Needle: 26g,2" needle electrode  Number of units per muscle  Pectoralis0  Biceps0  FCR50  FCU0  FDS50  FDP0  Gastrosoleus0  Hamstrings 200   All injections were done after obtaining appropriate EMG activity and after negative drawback for blood. The patient tolerated the procedure well. Post procedure instructions were given. A followup appointment was made.    HPI  Review of Systems  Physical Exam

## 2012-05-24 NOTE — Patient Instructions (Signed)
OnabotulinumtoxinA injection (Medical Use) What is this medicine? ONABOTULINUMTOXINA is a neuro-muscular blocker. We use it for post stroke spasticityThis medicine may be used for other purposes; ask your health care provider or pharmacist if you have questions. What should I tell my health care provider before I take this medicine? They need to know if you have any of these conditions: -breathing problems -cerebral palsy spasms -difficulty urinating -heart problems -history of surgery where this medicine is going to be used -infection at the site where this medicine is going to be used -myasthenia gravis or other neurologic disease -nerve or muscle disease -surgery plans -take medicines that treat or prevent blood clots -thyroid problems -an unusual or allergic reaction to botulinum toxin, albumin, other medicines, foods, dyes, or preservatives -pregnant or trying to get pregnant -breast-feeding How should I use this medicine? This medicine is for injection into a muscle. It is given by a health care professional in a hospital or clinic setting. Talk to your pediatrician regarding the use of this medicine in children. While this drug may be prescribed for children as young as 53 years old for selected conditions, precautions do apply. Overdosage: If you think you have taken too much of this medicine contact a poison control center or emergency room at once. NOTE: This medicine is only for you. Do not share this medicine with others. What if I miss a dose? This does not apply. What may interact with this medicine? -aminoglycoside antibiotics like gentamicin, neomycin, tobramycin -muscle relaxants -other botulinum toxin injections This list may not describe all possible interactions. Give your health care provider a list of all the medicines, herbs, non-prescription drugs, or dietary supplements you use. Also tell them if you smoke, drink alcohol, or use illegal drugs. Some items may  interact with your medicine. What should I watch for while using this medicine? Visit your doctor for regular check ups. This medicine will cause weakness in the muscle where it is injected. Tell your doctor if you feel unusually weak in other muscles. Get medical help right away if you have problems with breathing, swallowing, or talking. This medicine might make your eyelids droop or make you see blurry or double. If you have weak muscles or trouble seeing do not drive a car, use machinery, or do other dangerous activities. This medicine contains albumin from human blood. It may be possible to pass an infection in this medicine, but no cases have been reported. Talk to your doctor about the risks and benefits of this medicine. If your activities have been limited by your condition, go back to your regular routine slowly after treatment with this medicine. What side effects may I notice from receiving this medicine? Side effects that you should report to your doctor or health care professional as soon as possible: -allergic reactions like skin rash, itching or hives, swelling of the face, lips, or tongue -breathing problems -changes in vision -chest pain or tightness -eye irritation, pain -fast, irregular heartbeat -infection -numbness -speech problems -swallowing problems -unusual weakness Side effects that usually do not require medical attention (report to your doctor or health care professional if they continue or are bothersome): -bruising or pain at site where injected -drooping eyelid -dry eyes or mouth -headache -muscles aches, pains -sensitivity to light -tearing This list may not describe all possible side effects. Call your doctor for medical advice about side effects. You may report side effects to FDA at 1-800-FDA-1088. Where should I keep my medicine? This drug is given in  a hospital or clinic and will not be stored at home. NOTE: This sheet is a summary. It may not  cover all possible information. If you have questions about this medicine, talk to your doctor, pharmacist, or health care provider.  2013, [employer]. (01/08/2010 8:06:56 AM)

## 2012-06-09 DIAGNOSIS — J0120 Acute ethmoidal sinusitis, unspecified: Secondary | ICD-10-CM | POA: Diagnosis not present

## 2012-06-14 ENCOUNTER — Ambulatory Visit (INDEPENDENT_AMBULATORY_CARE_PROVIDER_SITE_OTHER): Payer: Medicare Other | Admitting: *Deleted

## 2012-06-14 DIAGNOSIS — I4891 Unspecified atrial fibrillation: Secondary | ICD-10-CM | POA: Diagnosis not present

## 2012-06-14 DIAGNOSIS — Z7901 Long term (current) use of anticoagulants: Secondary | ICD-10-CM | POA: Diagnosis not present

## 2012-06-21 DIAGNOSIS — I739 Peripheral vascular disease, unspecified: Secondary | ICD-10-CM | POA: Diagnosis not present

## 2012-06-21 DIAGNOSIS — L84 Corns and callosities: Secondary | ICD-10-CM | POA: Diagnosis not present

## 2012-06-21 DIAGNOSIS — L608 Other nail disorders: Secondary | ICD-10-CM | POA: Diagnosis not present

## 2012-06-25 ENCOUNTER — Encounter: Payer: Self-pay | Admitting: Family

## 2012-06-25 ENCOUNTER — Ambulatory Visit (INDEPENDENT_AMBULATORY_CARE_PROVIDER_SITE_OTHER): Payer: Medicare Other | Admitting: Family

## 2012-06-25 VITALS — BP 150/86 | HR 77 | Temp 97.7°F | Resp 16 | Ht 75.0 in | Wt 274.0 lb

## 2012-06-25 DIAGNOSIS — R7309 Other abnormal glucose: Secondary | ICD-10-CM

## 2012-06-25 DIAGNOSIS — E039 Hypothyroidism, unspecified: Secondary | ICD-10-CM | POA: Diagnosis not present

## 2012-06-25 DIAGNOSIS — I10 Essential (primary) hypertension: Secondary | ICD-10-CM

## 2012-06-25 DIAGNOSIS — I4891 Unspecified atrial fibrillation: Secondary | ICD-10-CM

## 2012-06-25 DIAGNOSIS — E291 Testicular hypofunction: Secondary | ICD-10-CM | POA: Diagnosis not present

## 2012-06-25 DIAGNOSIS — E119 Type 2 diabetes mellitus without complications: Secondary | ICD-10-CM

## 2012-06-25 DIAGNOSIS — E785 Hyperlipidemia, unspecified: Secondary | ICD-10-CM | POA: Diagnosis not present

## 2012-06-25 DIAGNOSIS — Z5181 Encounter for therapeutic drug level monitoring: Secondary | ICD-10-CM

## 2012-06-25 LAB — BASIC METABOLIC PANEL WITH GFR
CO2: 30 mEq/L (ref 19–32)
Calcium: 9.4 mg/dL (ref 8.4–10.5)
Chloride: 103 mEq/L (ref 96–112)
Creat: 1.04 mg/dL (ref 0.50–1.35)
Glucose, Bld: 114 mg/dL — ABNORMAL HIGH (ref 70–99)
Sodium: 141 mEq/L (ref 135–145)

## 2012-06-25 LAB — HEPATIC FUNCTION PANEL
Alkaline Phosphatase: 53 U/L (ref 39–117)
Bilirubin, Direct: 0.1 mg/dL (ref 0.0–0.3)
Indirect Bilirubin: 0.3 mg/dL (ref 0.0–0.9)
Total Protein: 6.9 g/dL (ref 6.0–8.3)

## 2012-06-25 LAB — HEMOGLOBIN A1C: Mean Plasma Glucose: 140 mg/dL — ABNORMAL HIGH (ref <117)

## 2012-06-25 NOTE — Assessment & Plan Note (Signed)
On Synthroid, obtain tsh.

## 2012-06-25 NOTE — Assessment & Plan Note (Signed)
LDL at goal. Tolerating statin. Obtain lft.

## 2012-06-25 NOTE — Assessment & Plan Note (Signed)
Clinically stable.  Obtain A1C, continue metformin. Discussed diabetic diet, exercise, weight loss.

## 2012-06-25 NOTE — Assessment & Plan Note (Addendum)
On testosterone but admits to not using daily, obtain follow up testosterone level.

## 2012-06-25 NOTE — Assessment & Plan Note (Signed)
BP Readings from Last 3 Encounters:  06/25/12 150/86  05/24/12 140/71  03/26/12 144/82   Obtain bmet

## 2012-06-25 NOTE — Patient Instructions (Addendum)
Please complete lab work prior to leaving.   Please schedule a follow up appointment in 3 months.  

## 2012-06-25 NOTE — Assessment & Plan Note (Signed)
Rate stable, on coumadin, management per coumadin.

## 2012-06-25 NOTE — Progress Notes (Signed)
Subjective:    Patient ID: [name], male    DOB: 15-Feb-1946, 67 y.o.   MRN: [id number]  HPI  [name] is a 67 yr old male who presents today for follow up.  1) DM2- Pt is currently maintained on ACE, Metformin. He does not checking sugars at home.   2) HTN- On benazepril, diltiazem.  He did not take his AM medicines today.  3) Hypothyroid- on synthroid.    4) Hyperlipidemia- on crestor-last LDL September 2013 was 50.  Denies myalgia.   5) AF- on diltiazem, coumadin. Denies CP or palptations.  6) Hypogonadism- reports that he is on testosterone therapy, but has not been taking regularly.s   Review of Systems See HPI  Past Medical History  Diagnosis Date  . GERD (gastroesophageal reflux disease)   . Gout   . Hyperlipidemia   . Hypertension   . CVA (cerebral infarction)     left hemiparesis  . Chronic pain     left sided-Kristeins  . CAD (coronary artery disease)     s/p CABG, s/p DES to LCX  January 2011  . OSA on CPAP   . Sleep apnea   . History of colonoscopy     History   Social History  . Marital Status: Married    Spouse Name: [name]    Number of Children: N/A  . Years of Education: N/A   Occupational History  . retired Web designer   Social History Main Topics  . Smoking status: Never Smoker   . Smokeless tobacco: Never Used  . Alcohol Use: No  . Drug Use: No  . Sexually Active: Yes   Other Topics Concern  . Not on file   Social History Narrative  . No narrative on file    Past Surgical History  Procedure Laterality Date  . Coronary artery bypass graft      stent  . Esophagogastroduodenoscopy  03-25-2005  . Nasal septum surgery    . Percutaneous placement intravascular stent cervical carotid artery      03-2009; using a drug-eluting platform of the circumflex cornoray artery with a 3.0 x 18 [employer] Promus drug-eluting platform post dilated to 3.75 with a noncompliant balloon.  . Cataract extraction  05/2010     left eye  . Cataract extraction  04/2010    right eye  . Cardioversion  06/20/2011    Procedure: CARDIOVERSION;  Surgeon: Laurey Morale, MD;  Location: [facility] ENDOSCOPY;  Service: Cardiovascular;  Laterality: N/A;  . Tee without cardioversion  06/20/2011    Procedure: TRANSESOPHAGEAL ECHOCARDIOGRAM (TEE);  Surgeon: Laurey Morale, MD;  Location: [facility] ENDOSCOPY;  Service: Cardiovascular;  Laterality: N/A;    Family History  Problem Relation Age of Onset  . Lung cancer Father     deceased  . Stroke Mother     deceased-MINISTROKES    No Known Allergies  Current Outpatient Prescriptions on File Prior to Visit  Medication Sig Dispense Refill  . allopurinol (ZYLOPRIM) 100 MG tablet TAKE 2 TABLETS BY MOUTH EVERY DAY  180 tablet  1  . aspirin 81 MG tablet Take 81 mg by mouth daily.        . benazepril (LOTENSIN) 5 MG tablet TAKE 1 TABLET BY MOUTH EVERY DAY  90 tablet  3  . CRESTOR 40 MG tablet TAKE 1 TABLET BY MOUTH EVERY DAY  90 tablet  1  . diltiazem (DILACOR XR) 180 MG 24 hr capsule       .  furosemide (LASIX) 40 MG tablet       . levothyroxine (SYNTHROID, LEVOTHROID) 112 MCG tablet Take 1 tablet (112 mcg total) by mouth daily.  90 tablet  3  . metFORMIN (GLUCOPHAGE) 500 MG tablet Take 500 mg by mouth daily with breakfast.  90 tablet  3  . nitroGLYCERIN (NITROSTAT) 0.4 MG SL tablet Place 0.4 mg under the tongue every 5 (five) minutes as needed. For chest pain Up to 3 doses      . NON FORMULARY 2 L/M for sleep       . pantoprazole (PROTONIX) 40 MG tablet Take 1 tablet (40 mg total) by mouth daily.  90 tablet  3  . Testosterone (ANDRODERM) 2 MG/24HR PT24 Place 1 patch onto the skin daily.  30 patch  3  . tiZANidine (ZANAFLEX) 2 MG tablet TAKE 1 TABLET AT BEDTIME  90 tablet  2  . traMADol (ULTRAM) 50 MG tablet Take 1 tablet (50 mg total) by mouth 2 (two) times daily.  180 tablet  2  . warfarin (COUMADIN) 2.5 MG tablet TAKE AS DIRECTED BY ANTICOAGULATION CLINIC  100 tablet  1   No current  facility-administered medications on file prior to visit.    BP 150/86  Pulse 77  Temp(Src) 97.7 F (36.5 C) (Oral)  Resp 16  Ht 6\' 3"  (1.905 m)  Wt 274 lb (124.286 kg)  BMI 34.25 kg/m2  SpO2 97%       Objective:   Physical Exam  Constitutional: He is oriented to person, place, and time. He appears well-developed and well-nourished. No distress.  HENT:  Head: Normocephalic.  Cardiovascular: Normal rate and regular rhythm.   No murmur heard. Pulmonary/Chest: Effort normal and breath sounds normal. No respiratory distress. He has no wheezes. He has no rales. He exhibits no tenderness.  Abdominal: Soft. He exhibits no distension. There is no tenderness.  Musculoskeletal:  1+ LLE edema- wearing compression stocking  Neurological: He is alert and oriented to person, place, and time. Gait abnormal.  Psychiatric: He has a normal mood and affect. His behavior is normal. Judgment and thought content normal.          Assessment & Plan:

## 2012-06-28 LAB — TESTOSTERONE, FREE, TOTAL, SHBG
Sex Hormone Binding: 38 nmol/L (ref 13–71)
Testosterone-% Free: 1.7 % (ref 1.6–2.9)
Testosterone: 191 ng/dL — ABNORMAL LOW (ref 300–890)

## 2012-07-04 ENCOUNTER — Encounter: Payer: Self-pay | Admitting: Family

## 2012-07-15 ENCOUNTER — Other Ambulatory Visit: Payer: Self-pay | Admitting: *Deleted

## 2012-07-15 MED ORDER — NONFORMULARY OR COMPOUNDED ITEM: Status: DC

## 2012-07-15 NOTE — Telephone Encounter (Signed)
Refilled non formulary compounded cream per fax request from pharmacy. [facility], [place])

## 2012-07-19 ENCOUNTER — Ambulatory Visit (INDEPENDENT_AMBULATORY_CARE_PROVIDER_SITE_OTHER): Payer: Medicare Other

## 2012-07-19 DIAGNOSIS — Z7901 Long term (current) use of anticoagulants: Secondary | ICD-10-CM | POA: Diagnosis not present

## 2012-07-19 DIAGNOSIS — I4891 Unspecified atrial fibrillation: Secondary | ICD-10-CM

## 2012-07-19 LAB — POCT INR: INR: 3.2

## 2012-07-22 ENCOUNTER — Encounter: Payer: Self-pay | Admitting: Family

## 2012-07-28 ENCOUNTER — Other Ambulatory Visit: Payer: Self-pay | Admitting: *Deleted

## 2012-07-28 ENCOUNTER — Other Ambulatory Visit: Payer: Self-pay | Admitting: Cardiology

## 2012-08-23 ENCOUNTER — Ambulatory Visit (INDEPENDENT_AMBULATORY_CARE_PROVIDER_SITE_OTHER): Payer: Medicare Other | Admitting: *Deleted

## 2012-08-23 DIAGNOSIS — Z7901 Long term (current) use of anticoagulants: Secondary | ICD-10-CM

## 2012-08-23 DIAGNOSIS — I4891 Unspecified atrial fibrillation: Secondary | ICD-10-CM

## 2012-08-24 ENCOUNTER — Ambulatory Visit: Payer: Medicare Other | Admitting: Physical Medicine & Rehabilitation

## 2012-08-26 ENCOUNTER — Other Ambulatory Visit: Payer: Self-pay | Admitting: Physical Medicine & Rehabilitation

## 2012-08-27 ENCOUNTER — Other Ambulatory Visit: Payer: Self-pay | Admitting: *Deleted

## 2012-08-27 MED ORDER — TRAMADOL HCL 50 MG PO TABS: 50.0000 mg | ORAL_TABLET | Freq: Two times a day (BID) | ORAL | Status: DC

## 2012-08-31 ENCOUNTER — Other Ambulatory Visit: Payer: Self-pay | Admitting: *Deleted

## 2012-08-31 ENCOUNTER — Emergency Department (HOSPITAL_COMMUNITY)
Admission: EM | Admit: 2012-08-31 | Discharge: 2012-09-01 | Disposition: A | Discharge status: Home / Self Care | Payer: Medicare Other | Attending: Emergency Medicine | Admitting: Emergency Medicine

## 2012-08-31 ENCOUNTER — Encounter (HOSPITAL_COMMUNITY): Payer: Self-pay | Admitting: Emergency Medicine

## 2012-08-31 DIAGNOSIS — Z79899 Other long term (current) drug therapy: Secondary | ICD-10-CM | POA: Diagnosis not present

## 2012-08-31 DIAGNOSIS — S8011XA Contusion of right lower leg, initial encounter: Secondary | ICD-10-CM

## 2012-08-31 DIAGNOSIS — I10 Essential (primary) hypertension: Secondary | ICD-10-CM | POA: Diagnosis not present

## 2012-08-31 DIAGNOSIS — Y929 Unspecified place or not applicable: Secondary | ICD-10-CM | POA: Insufficient documentation

## 2012-08-31 DIAGNOSIS — Z7901 Long term (current) use of anticoagulants: Secondary | ICD-10-CM | POA: Diagnosis not present

## 2012-08-31 DIAGNOSIS — Z8673 Personal history of transient ischemic attack (TIA), and cerebral infarction without residual deficits: Secondary | ICD-10-CM | POA: Diagnosis not present

## 2012-08-31 DIAGNOSIS — G8929 Other chronic pain: Secondary | ICD-10-CM | POA: Diagnosis not present

## 2012-08-31 DIAGNOSIS — I2510 Atherosclerotic heart disease of native coronary artery without angina pectoris: Secondary | ICD-10-CM | POA: Insufficient documentation

## 2012-08-31 DIAGNOSIS — Z951 Presence of aortocoronary bypass graft: Secondary | ICD-10-CM | POA: Diagnosis not present

## 2012-08-31 DIAGNOSIS — S8010XA Contusion of unspecified lower leg, initial encounter: Secondary | ICD-10-CM | POA: Diagnosis not present

## 2012-08-31 DIAGNOSIS — G4733 Obstructive sleep apnea (adult) (pediatric): Secondary | ICD-10-CM | POA: Insufficient documentation

## 2012-08-31 DIAGNOSIS — Y939 Activity, unspecified: Secondary | ICD-10-CM | POA: Insufficient documentation

## 2012-08-31 DIAGNOSIS — E785 Hyperlipidemia, unspecified: Secondary | ICD-10-CM | POA: Insufficient documentation

## 2012-08-31 DIAGNOSIS — Z9989 Dependence on other enabling machines and devices: Secondary | ICD-10-CM | POA: Diagnosis not present

## 2012-08-31 DIAGNOSIS — X58XXXA Exposure to other specified factors, initial encounter: Secondary | ICD-10-CM | POA: Insufficient documentation

## 2012-08-31 DIAGNOSIS — Z7982 Long term (current) use of aspirin: Secondary | ICD-10-CM | POA: Insufficient documentation

## 2012-08-31 DIAGNOSIS — Z862 Personal history of diseases of the blood and blood-forming organs and certain disorders involving the immune mechanism: Secondary | ICD-10-CM | POA: Diagnosis not present

## 2012-08-31 DIAGNOSIS — Z8639 Personal history of other endocrine, nutritional and metabolic disease: Secondary | ICD-10-CM | POA: Insufficient documentation

## 2012-08-31 DIAGNOSIS — M19079 Primary osteoarthritis, unspecified ankle and foot: Secondary | ICD-10-CM | POA: Diagnosis not present

## 2012-08-31 LAB — CBC WITH DIFFERENTIAL/PLATELET
Basophils Relative: 1 % (ref 0–1)
Eosinophils Absolute: 0.6 10*3/uL (ref 0.0–0.7)
Eosinophils Relative: 8 % — ABNORMAL HIGH (ref 0–5)
HCT: 38.9 % — ABNORMAL LOW (ref 39.0–52.0)
Hemoglobin: 12.9 g/dL — ABNORMAL LOW (ref 13.0–17.0)
MCH: 27.9 pg (ref 26.0–34.0)
MCHC: 33.2 g/dL (ref 30.0–36.0)
Monocytes Absolute: 0.7 10*3/uL (ref 0.1–1.0)
Monocytes Relative: 9 % (ref 3–12)

## 2012-08-31 LAB — PROTIME-INR
INR: 2.47 — ABNORMAL HIGH (ref 0.00–1.49)
Prothrombin Time: 25.6 seconds — ABNORMAL HIGH (ref 11.6–15.2)

## 2012-08-31 LAB — COMPREHENSIVE METABOLIC PANEL
Albumin: 3.9 g/dL (ref 3.5–5.2)
BUN: 14 mg/dL (ref 6–23)
Creatinine, Ser: 1.05 mg/dL (ref 0.50–1.35)
Total Bilirubin: 0.7 mg/dL (ref 0.3–1.2)
Total Protein: 7.4 g/dL (ref 6.0–8.3)

## 2012-08-31 NOTE — Telephone Encounter (Signed)
This refill should come from his pain management dr. Who has been filling.

## 2012-08-31 NOTE — Telephone Encounter (Signed)
Pt request for Tizanidine 2 mg Last Rx: 08.01.13, #90x2 Last OV: 04.11.14 Return: 3 Months Please advise on refills/SLS

## 2012-08-31 NOTE — ED Notes (Signed)
PT. REPORTS RIGHT LOWER LEG PAIN / SWELLING ONSET 2 DAYS AGO , DENIES INJURY , FAINT PEDAL PULSE .

## 2012-08-31 NOTE — Telephone Encounter (Signed)
LMOM with contact name and number RE: refill request for Tizanidine and further provider instructions requiring pt to request this medication via pain mgt, Dr. Joelyn Oms, per EMR as previous provider filling medication/SLS

## 2012-09-01 ENCOUNTER — Emergency Department (HOSPITAL_COMMUNITY): Payer: Medicare Other

## 2012-09-01 DIAGNOSIS — M19079 Primary osteoarthritis, unspecified ankle and foot: Secondary | ICD-10-CM | POA: Diagnosis not present

## 2012-09-01 MED ORDER — HYDROCODONE-ACETAMINOPHEN 5-325 MG PO TABS: 1.0000 | ORAL_TABLET | Freq: Four times a day (QID) | ORAL | Status: DC | PRN

## 2012-09-01 NOTE — ED Notes (Signed)
IV in Left AC removed. Catheter intact

## 2012-09-01 NOTE — ED Provider Notes (Signed)
History     CSN: [id number]  Arrival date & time 08/31/12  2212   First MD Initiated Contact with Patient 09/01/12 0055      Chief Complaint  Patient presents with  . Leg Pain    (Consider location/radiation/quality/duration/timing/severity/associated sxs/prior treatment) HPI This is a 67 year old male on chronic warfarin therapy. He is here with pain and swelling in his right lower leg that began 2 or 3 days ago. It has gotten worse gradually. He has noticed ecchymosis of his right popliteal fossa as well as a palpable knot in the upper calf. His pain is primarily in the anterior lower leg and worse with palpation or attempted ambulation. Ambulation is difficult due to pain and swelling. He denies chest pain or shortness of breath. He denies injury to the leg.  Past Medical History  Diagnosis Date  . GERD (gastroesophageal reflux disease)   . Gout   . Hyperlipidemia   . Hypertension   . CVA (cerebral infarction)     left hemiparesis  . Chronic pain     left sided-Kristeins  . CAD (coronary artery disease)     s/p CABG, s/p DES to LCX  January 2011  . OSA on CPAP   . Sleep apnea   . History of colonoscopy     Past Surgical History  Procedure Laterality Date  . Coronary artery bypass graft      stent  . Esophagogastroduodenoscopy  03-25-2005  . Nasal septum surgery    . Percutaneous placement intravascular stent cervical carotid artery      03-2009; using a drug-eluting platform of the circumflex cornoray artery with a 3.0 x 18 [employer] Promus drug-eluting platform post dilated to 3.75 with a noncompliant balloon.  . Cataract extraction  05/2010    left eye  . Cataract extraction  04/2010    right eye  . Cardioversion  06/20/2011    Procedure: CARDIOVERSION;  Surgeon: Laurey Morale, MD;  Location: [facility] ENDOSCOPY;  Service: Cardiovascular;  Laterality: N/A;  . Tee without cardioversion  06/20/2011    Procedure: TRANSESOPHAGEAL ECHOCARDIOGRAM (TEE);  Surgeon: Laurey Morale, MD;  Location: [facility] ENDOSCOPY;  Service: Cardiovascular;  Laterality: N/A;    Family History  Problem Relation Age of Onset  . Lung cancer Father     deceased  . Stroke Mother     deceased-MINISTROKES    History  Substance Use Topics  . Smoking status: Never Smoker   . Smokeless tobacco: Never Used  . Alcohol Use: No      Review of Systems  All other systems reviewed and are negative.    Allergies  Review of patient's allergies indicates no known allergies.  Home Medications   Current Outpatient Rx  Name  Route  Sig  Dispense  Refill  . allopurinol (ZYLOPRIM) 100 MG tablet   Oral   Take 200 mg by mouth daily.         Marland Kitchen aspirin 81 MG tablet   Oral   Take 81 mg by mouth daily.          . benazepril (LOTENSIN) 5 MG tablet   Oral   Take 5 mg by mouth daily.          Marland Kitchen diltiazem (DILACOR XR) 180 MG 24 hr capsule   Oral   Take 180 mg by mouth daily.          . furosemide (LASIX) 40 MG tablet   Oral   Take 40 mg by  mouth every other day.          . levothyroxine (SYNTHROID, LEVOTHROID) 112 MCG tablet   Oral   Take 112 mcg by mouth daily.         . metFORMIN (GLUCOPHAGE) 500 MG tablet   Oral   Take 500 mg by mouth daily with breakfast. Take 500 mg by mouth daily with breakfast.         . NONFORMULARY OR COMPOUNDED ITEM   Topical   Apply 1-2 g topically See admin instructions. Amantadine 8%,Diclofenac 3%, Baclofen 2%, Cyclobenzaprine 2%, Gabapentin 10%, Bupivacaine 2% #240 gm Apply 1-2 gms to affected area 3-4 times daily         . pantoprazole (PROTONIX) 40 MG tablet   Oral   Take 40 mg by mouth daily.         . rosuvastatin (CRESTOR) 40 MG tablet   Oral   Take 40 mg by mouth daily.         Marland Kitchen tiZANidine (ZANAFLEX) 2 MG tablet   Oral   Take 2 mg by mouth at bedtime.         . traMADol (ULTRAM) 50 MG tablet   Oral   Take 50 mg by mouth 2 (two) times daily.         . traMADol (ULTRAM-ER) 200 MG 24 hr tablet   Oral    Take 200 mg by mouth daily. Takes with morning dose of 50mg  tablet         . warfarin (COUMADIN) 2.5 MG tablet   Oral   Take 2.5 mg by mouth daily.         . nitroGLYCERIN (NITROSTAT) 0.4 MG SL tablet   Sublingual   Place 0.4 mg under the tongue every 5 (five) minutes as needed. For chest pain Up to 3 doses           BP 143/74  Pulse 71  Temp(Src) 98.8 F (37.1 C) (Oral)  Resp 18  SpO2 99%  Physical Exam General: Well-developed, well-nourished male in no acute distress; appearance consistent with age of record HENT: normocephalic, atraumatic Eyes: pupils equal round and reactive to light; extraocular muscles intact Neck: supple Heart: regular rate and rhythm Lungs: clear to auscultation bilaterally Abdomen: soft; nondistended; nontender; bowel sounds present Extremities: No deformity; left lower leg and TED hose; 2+ pitting edema of the right lower leg with calf tenderness, a tender knot in the proximal calf just distal to the right popliteal fossa and ecchymosis of the right popliteal fossa Neurologic: Awake, alert and oriented;  left hemiparesis ; no facial droop Skin: Warm and dry Psychiatric: Normal mood and affect    ED Course  Procedures (including critical care time)     MDM   Nursing notes and vitals signs, including pulse oximetry, reviewed.  Summary of this visit's results, reviewed by myself:  Labs:  Results for orders placed during the hospital encounter of 08/31/12 (from the past 24 hour(s))  CBC WITH DIFFERENTIAL     Status: Abnormal   Collection Time    08/31/12 10:24 PM      Result Value Range   WBC 7.3  4.0 - 10.5 K/uL   RBC 4.62  4.22 - 5.81 MIL/uL   Hemoglobin 12.9 (*) 13.0 - 17.0 g/dL   HCT 81.1 (*) 91.4 - 78.2 %   MCV 84.2  78.0 - 100.0 fL   MCH 27.9  26.0 - 34.0 pg   MCHC 33.2  30.0 - 36.0  g/dL   RDW 21.3  08.6 - 57.8 %   Platelets 192  150 - 400 K/uL   Neutrophils Relative % 49  43 - 77 %   Neutro Abs 3.6  1.7 - 7.7 K/uL    Lymphocytes Relative 33  12 - 46 %   Lymphs Abs 2.4  0.7 - 4.0 K/uL   Monocytes Relative 9  3 - 12 %   Monocytes Absolute 0.7  0.1 - 1.0 K/uL   Eosinophils Relative 8 (*) 0 - 5 %   Eosinophils Absolute 0.6  0.0 - 0.7 K/uL   Basophils Relative 1  0 - 1 %   Basophils Absolute 0.1  0.0 - 0.1 K/uL  COMPREHENSIVE METABOLIC PANEL     Status: Abnormal   Collection Time    08/31/12 10:24 PM      Result Value Range   Sodium 138  135 - 145 mEq/L   Potassium 4.1  3.5 - 5.1 mEq/L   Chloride 101  96 - 112 mEq/L   CO2 28  19 - 32 mEq/L   Glucose, Bld 133 (*) 70 - 99 mg/dL   BUN 14  6 - 23 mg/dL   Creatinine, Ser 4.69  0.50 - 1.35 mg/dL   Calcium 9.5  8.4 - 62.9 mg/dL   Total Protein 7.4  6.0 - 8.3 g/dL   Albumin 3.9  3.5 - 5.2 g/dL   AST 55 (*) 0 - 37 U/L   ALT 49  0 - 53 U/L   Alkaline Phosphatase 60  39 - 117 U/L   Total Bilirubin 0.7  0.3 - 1.2 mg/dL   GFR calc non Af Amer 70 (*) >90 mL/min   GFR calc Af Amer 82 (*) >90 mL/min  PROTIME-INR     Status: Abnormal   Collection Time    08/31/12 10:24 PM      Result Value Range   Prothrombin Time 25.6 (*) 11.6 - 15.2 seconds   INR 2.47 (*) 0.00 - 1.49   5:09 AM CT scan shows hematoma of the gastrocnemius consistent with the nodule palpated on physical exam. We will advise bed rest and elevation. He was advised to contact the physician that prescribed his warfarin for further instructions later this morning.         Hanley Seamen, MD 09/01/12 [phone]

## 2012-09-02 ENCOUNTER — Ambulatory Visit (INDEPENDENT_AMBULATORY_CARE_PROVIDER_SITE_OTHER): Payer: Medicare Other | Admitting: Family

## 2012-09-02 ENCOUNTER — Ambulatory Visit: Payer: Medicare Other | Admitting: Physical Medicine & Rehabilitation

## 2012-09-02 ENCOUNTER — Telehealth: Payer: Self-pay | Admitting: Cardiology

## 2012-09-02 ENCOUNTER — Encounter: Payer: Self-pay | Admitting: Family

## 2012-09-02 VITALS — BP 146/74 | HR 69 | Temp 98.5°F

## 2012-09-02 DIAGNOSIS — T148XXA Other injury of unspecified body region, initial encounter: Secondary | ICD-10-CM

## 2012-09-02 LAB — CBC WITH DIFFERENTIAL/PLATELET
Basophils Absolute: 0.1 10*3/uL (ref 0.0–0.1)
Basophils Relative: 1 % (ref 0–1)
Eosinophils Relative: 6 % — ABNORMAL HIGH (ref 0–5)
HCT: 39.6 % (ref 39.0–52.0)
MCH: 27 pg (ref 26.0–34.0)
MCHC: 32.3 g/dL (ref 30.0–36.0)
MCV: 83.5 fL (ref 78.0–100.0)
Monocytes Absolute: 0.5 10*3/uL (ref 0.1–1.0)
Monocytes Relative: 8 % (ref 3–12)
RDW: 14.4 % (ref 11.5–15.5)

## 2012-09-02 NOTE — Progress Notes (Signed)
Subjective:    Patient ID: [name], male    DOB: 12/27/1945, 67 y.o.   MRN: [id number]  HPI  [name] is a 67 yr old male who presents today for ED follow up.  He was seen in the ED on 08/31/12 due to right leg pain.  ED records are reviewed.  He underwent a CT scan of the the leg which showed a hematoma of the gastrocnemius.  He is maintained on coumadin due to hx of AF.  INR that day was 2.47.    Reports that he had a charlie horse one night.  No known trauma.  Contacted the coumadin clinic and they instructed him to continue his current dose of coumadin.  He reports that the area of induration is less tender and less swollen than it was.     Review of Systems See HPI  Past Medical History  Diagnosis Date  . GERD (gastroesophageal reflux disease)   . Gout   . Hyperlipidemia   . Hypertension   . CVA (cerebral infarction)     left hemiparesis  . Chronic pain     left sided-Kristeins  . CAD (coronary artery disease)     s/p CABG, s/p DES to LCX  January 2011  . OSA on CPAP   . Sleep apnea   . History of colonoscopy     History   Social History  . Marital Status: Married    Spouse Name: [name]    Number of Children: N/A  . Years of Education: N/A   Occupational History  . retired Web designer   Social History Main Topics  . Smoking status: Never Smoker   . Smokeless tobacco: Never Used  . Alcohol Use: No  . Drug Use: No  . Sexually Active: Yes   Other Topics Concern  . Not on file   Social History Narrative  . No narrative on file    Past Surgical History  Procedure Laterality Date  . Coronary artery bypass graft      stent  . Esophagogastroduodenoscopy  03-25-2005  . Nasal septum surgery    . Percutaneous placement intravascular stent cervical carotid artery      03-2009; using a drug-eluting platform of the circumflex cornoray artery with a 3.0 x 18 [employer] Promus drug-eluting platform post dilated to 3.75 with a  noncompliant balloon.  . Cataract extraction  05/2010    left eye  . Cataract extraction  04/2010    right eye  . Cardioversion  06/20/2011    Procedure: CARDIOVERSION;  Surgeon: Laurey Morale, MD;  Location: [facility] ENDOSCOPY;  Service: Cardiovascular;  Laterality: N/A;  . Tee without cardioversion  06/20/2011    Procedure: TRANSESOPHAGEAL ECHOCARDIOGRAM (TEE);  Surgeon: Laurey Morale, MD;  Location: [facility] ENDOSCOPY;  Service: Cardiovascular;  Laterality: N/A;    Family History  Problem Relation Age of Onset  . Lung cancer Father     deceased  . Stroke Mother     deceased-MINISTROKES    No Known Allergies  Current Outpatient Prescriptions on File Prior to Visit  Medication Sig Dispense Refill  . allopurinol (ZYLOPRIM) 100 MG tablet Take 200 mg by mouth daily.      Marland Kitchen aspirin 81 MG tablet Take 81 mg by mouth daily.       . benazepril (LOTENSIN) 5 MG tablet Take 5 mg by mouth daily.       Marland Kitchen diltiazem (DILACOR XR) 180 MG 24 hr capsule  Take 180 mg by mouth daily.       . furosemide (LASIX) 40 MG tablet Take 40 mg by mouth every other day.       Marland Kitchen HYDROcodone-acetaminophen (NORCO) 5-325 MG per tablet Take 1-2 tablets by mouth every 6 (six) hours as needed for pain.  20 tablet  0  . levothyroxine (SYNTHROID, LEVOTHROID) 112 MCG tablet Take 112 mcg by mouth daily.      . metFORMIN (GLUCOPHAGE) 500 MG tablet Take 500 mg by mouth daily with breakfast. Take 500 mg by mouth daily with breakfast.      . nitroGLYCERIN (NITROSTAT) 0.4 MG SL tablet Place 0.4 mg under the tongue every 5 (five) minutes as needed. For chest pain Up to 3 doses      . NONFORMULARY OR COMPOUNDED ITEM Apply 1-2 g topically See admin instructions. Amantadine 8%,Diclofenac 3%, Baclofen 2%, Cyclobenzaprine 2%, Gabapentin 10%, Bupivacaine 2% #240 gm Apply 1-2 gms to affected area 3-4 times daily      . pantoprazole (PROTONIX) 40 MG tablet Take 40 mg by mouth daily.      . rosuvastatin (CRESTOR) 40 MG tablet Take 40 mg by mouth  daily.      Marland Kitchen tiZANidine (ZANAFLEX) 2 MG tablet Take 2 mg by mouth at bedtime.      . traMADol (ULTRAM) 50 MG tablet Take 50 mg by mouth 2 (two) times daily.      . traMADol (ULTRAM-ER) 200 MG 24 hr tablet Take 200 mg by mouth daily. Takes with morning dose of 50mg  tablet      . warfarin (COUMADIN) 2.5 MG tablet Take 2.5 mg by mouth daily.       No current facility-administered medications on file prior to visit.    BP 146/74  Pulse 69  Temp(Src) 98.5 F (36.9 C) (Oral)  SpO2 97%       Objective:   Physical Exam  Constitutional: He appears well-developed and well-nourished. No distress.  Cardiovascular: Normal rate and regular rhythm.   No murmur heard. Pulmonary/Chest: Effort normal and breath sounds normal. No respiratory distress. He has no wheezes. He has no rales. He exhibits no tenderness.  Musculoskeletal:  approx 1 inch wide area of induration noted on right posterior calf.  1+ RLE swelling.            Assessment & Plan:

## 2012-09-02 NOTE — Telephone Encounter (Signed)
New Prob    Pt states he had a hematoma the other day and would like to speak to nurse regarding this. Please call.

## 2012-09-02 NOTE — Telephone Encounter (Signed)
Pt calls b/c he was concerned about the hematoma he has on the back of his calf. He has been on bedrest since going to the ED on 08/31/12. I spoke with Kennon Rounds & she has reviewed his INR & did not recommend any changes to his coumadin dose.  Pt understands and will continue current dose of Coumadin. He will follow-up with his pcp this afternoon.  He will also keep his leg elevated as much as possible. States it is painful to stand up on his leg.  Reassurance given Mylo Red RN

## 2012-09-02 NOTE — Patient Instructions (Addendum)
Please complete your lab work prior to leaving.  Call if swelling worsens or if it does not continue to improve. Keep your upcoming follow up with the coumadin clinic. Follow up in the end of July.

## 2012-09-02 NOTE — Assessment & Plan Note (Signed)
Clinically improving. Will repeat CBC.  Continue coumadin with follow up at the coumadin clinic as scheduled.

## 2012-09-03 ENCOUNTER — Encounter: Payer: Self-pay | Admitting: Family

## 2012-09-07 ENCOUNTER — Ambulatory Visit: Payer: Medicare Other | Admitting: Family

## 2012-09-08 ENCOUNTER — Telehealth: Payer: Self-pay

## 2012-09-08 MED ORDER — TRAMADOL HCL ER 200 MG PO TB24: 200.0000 mg | ORAL_TABLET | Freq: Every day | ORAL | Status: DC

## 2012-09-08 NOTE — Telephone Encounter (Signed)
Tramadol 200mg  refilled patient aware.

## 2012-09-08 NOTE — Telephone Encounter (Signed)
Patient called requesting tramadol.  He has not been seen since march.  He says he has been in the hospital and has a hematoma and cannot come in.  Please advise/fill tramdol.

## 2012-09-08 NOTE — Telephone Encounter (Signed)
It is ok to refill, he should schedule a follow up visit, if he has not done this yet

## 2012-09-13 DIAGNOSIS — I739 Peripheral vascular disease, unspecified: Secondary | ICD-10-CM | POA: Diagnosis not present

## 2012-09-13 DIAGNOSIS — L84 Corns and callosities: Secondary | ICD-10-CM | POA: Diagnosis not present

## 2012-09-13 DIAGNOSIS — L608 Other nail disorders: Secondary | ICD-10-CM | POA: Diagnosis not present

## 2012-09-16 ENCOUNTER — Encounter: Payer: Self-pay | Admitting: Physical Medicine & Rehabilitation

## 2012-09-16 ENCOUNTER — Ambulatory Visit (HOSPITAL_BASED_OUTPATIENT_CLINIC_OR_DEPARTMENT_OTHER): Payer: Medicare Other | Admitting: Physical Medicine & Rehabilitation

## 2012-09-16 ENCOUNTER — Encounter: Payer: Medicare Other | Attending: Physical Medicine & Rehabilitation

## 2012-09-16 VITALS — BP 142/81 | HR 85 | Resp 17 | Ht 75.0 in | Wt 271.0 lb

## 2012-09-16 DIAGNOSIS — G8110 Spastic hemiplegia affecting unspecified side: Secondary | ICD-10-CM

## 2012-09-16 MED ORDER — TRAMADOL HCL 50 MG PO TABS: 50.0000 mg | ORAL_TABLET | Freq: Two times a day (BID) | ORAL | Status: DC

## 2012-09-16 MED ORDER — TIZANIDINE HCL 2 MG PO TABS: 2.0000 mg | ORAL_TABLET | Freq: Every day | ORAL | Status: DC

## 2012-09-16 NOTE — Progress Notes (Signed)
Botox Injection for spasticity using needle EMG guidance and E stim Dilution: 50 Units/ml  Indication: Severe spasticity which interferes with ADL,mobility and/or hygiene and is unresponsive to medication management and other conservative care  Informed consent was obtained after describing risks and benefits of the procedure with the patient. This includes bleeding, bruising, infection, excessive weakness, or medication side effects. A REMS form is on file and signed.  Needle: 26g,2" needle electrode  Number of units per muscle  Pectoralis0  Biceps0  FCR50  FCU0  FDS50  FDP0  Gastrosoleus0  Hamstrings 200   All injections were done after obtaining appropriate EMG activity and E stinm and after negative drawback for blood. The patient tolerated the procedure well. Post procedure instructions were given. A followup appointment was made.

## 2012-09-24 ENCOUNTER — Ambulatory Visit: Payer: Medicare Other | Admitting: Family

## 2012-09-27 ENCOUNTER — Ambulatory Visit (INDEPENDENT_AMBULATORY_CARE_PROVIDER_SITE_OTHER): Payer: Medicare Other | Admitting: *Deleted

## 2012-09-27 DIAGNOSIS — Z7901 Long term (current) use of anticoagulants: Secondary | ICD-10-CM

## 2012-09-27 DIAGNOSIS — I4891 Unspecified atrial fibrillation: Secondary | ICD-10-CM | POA: Diagnosis not present

## 2012-09-28 ENCOUNTER — Ambulatory Visit: Payer: Medicare Other | Admitting: Family

## 2012-10-10 ENCOUNTER — Other Ambulatory Visit: Payer: Self-pay | Admitting: Family

## 2012-10-11 ENCOUNTER — Ambulatory Visit (INDEPENDENT_AMBULATORY_CARE_PROVIDER_SITE_OTHER): Payer: Medicare Other | Admitting: Family

## 2012-10-11 ENCOUNTER — Encounter: Payer: Self-pay | Admitting: Family

## 2012-10-11 ENCOUNTER — Ambulatory Visit: Payer: Medicare Other | Admitting: Family

## 2012-10-11 VITALS — BP 146/88 | HR 72 | Temp 97.7°F | Resp 16 | Wt 274.0 lb

## 2012-10-11 DIAGNOSIS — T148XXA Other injury of unspecified body region, initial encounter: Secondary | ICD-10-CM

## 2012-10-11 DIAGNOSIS — E119 Type 2 diabetes mellitus without complications: Secondary | ICD-10-CM

## 2012-10-11 DIAGNOSIS — E039 Hypothyroidism, unspecified: Secondary | ICD-10-CM

## 2012-10-11 DIAGNOSIS — I10 Essential (primary) hypertension: Secondary | ICD-10-CM

## 2012-10-11 DIAGNOSIS — E291 Testicular hypofunction: Secondary | ICD-10-CM

## 2012-10-11 DIAGNOSIS — R7309 Other abnormal glucose: Secondary | ICD-10-CM

## 2012-10-11 DIAGNOSIS — E785 Hyperlipidemia, unspecified: Secondary | ICD-10-CM | POA: Diagnosis not present

## 2012-10-11 LAB — LIPID PANEL
HDL: 46 mg/dL (ref >39)
LDL Cholesterol: 43 mg/dL (ref 0–99)
Total CHOL/HDL Ratio: 2.5 Ratio
Triglycerides: 129 mg/dL (ref <150)

## 2012-10-11 LAB — HEMOGLOBIN A1C: Mean Plasma Glucose: 128 mg/dL — ABNORMAL HIGH (ref <117)

## 2012-10-11 MED ORDER — BENAZEPRIL HCL 10 MG PO TABS: 10.0000 mg | ORAL_TABLET | Freq: Every day | ORAL | Status: DC

## 2012-10-11 NOTE — Progress Notes (Signed)
Subjective:    Patient ID: [name], male    DOB: 25-Jan-1946, 67 y.o.   MRN: [id number]  HPI  [name] is a 67 yr old male who presents today for follow up of multiple medical problems.  1) DM2- currently on metformin, ACE. He does not check his sugars at home.  Report that his last eye exam  was January 2014.   2) HTN- on lisinopril, diltiazem.  Lisinopril  3) Hyperlipidemia- currently on on crestor.  LDL last September was at goal at 50. He has not had breakfast today.    4) Hypothyroid- currently on levothyroxine. Reports tolerating current dose.  Not taking testosterone.    Review of Systems Reports resolution of the right leg hematoma.   Past Medical History  Diagnosis Date  . GERD (gastroesophageal reflux disease)   . Gout   . Hyperlipidemia   . Hypertension   . CVA (cerebral infarction)     left hemiparesis  . Chronic pain     left sided-Kristeins  . CAD (coronary artery disease)     s/p CABG, s/p DES to LCX  January 2011  . OSA on CPAP   . Sleep apnea   . History of colonoscopy     History   Social History  . Marital Status: Married    Spouse Name: [name]    Number of Children: N/A  . Years of Education: N/A   Occupational History  . retired Web designer   Social History Main Topics  . Smoking status: Never Smoker   . Smokeless tobacco: Never Used  . Alcohol Use: No  . Drug Use: No  . Sexually Active: Yes   Other Topics Concern  . Not on file   Social History Narrative  . No narrative on file    Past Surgical History  Procedure Laterality Date  . Coronary artery bypass graft      stent  . Esophagogastroduodenoscopy  03-25-2005  . Nasal septum surgery    . Percutaneous placement intravascular stent cervical carotid artery      03-2009; using a drug-eluting platform of the circumflex cornoray artery with a 3.0 x 18 [employer] Promus drug-eluting platform post dilated to 3.75 with a noncompliant balloon.  .  Cataract extraction  05/2010    left eye  . Cataract extraction  04/2010    right eye  . Cardioversion  06/20/2011    Procedure: CARDIOVERSION;  Surgeon: Laurey Morale, MD;  Location: [facility] ENDOSCOPY;  Service: Cardiovascular;  Laterality: N/A;  . Tee without cardioversion  06/20/2011    Procedure: TRANSESOPHAGEAL ECHOCARDIOGRAM (TEE);  Surgeon: Laurey Morale, MD;  Location: [facility] ENDOSCOPY;  Service: Cardiovascular;  Laterality: N/A;    Family History  Problem Relation Age of Onset  . Lung cancer Father     deceased  . Stroke Mother     deceased-MINISTROKES    No Known Allergies  Current Outpatient Prescriptions on File Prior to Visit  Medication Sig Dispense Refill  . allopurinol (ZYLOPRIM) 100 MG tablet Take 200 mg by mouth daily.      Marland Kitchen aspirin 81 MG tablet Take 81 mg by mouth daily.       Marland Kitchen diltiazem (DILACOR XR) 180 MG 24 hr capsule Take 180 mg by mouth daily.       . furosemide (LASIX) 40 MG tablet Take 40 mg by mouth every other day.       . levothyroxine (SYNTHROID, LEVOTHROID) 112 MCG tablet  Take 112 mcg by mouth daily.      . metFORMIN (GLUCOPHAGE) 500 MG tablet Take 500 mg by mouth daily with breakfast. Take 500 mg by mouth daily with breakfast.      . nitroGLYCERIN (NITROSTAT) 0.4 MG SL tablet Place 0.4 mg under the tongue every 5 (five) minutes as needed. For chest pain Up to 3 doses      . NONFORMULARY OR COMPOUNDED ITEM Apply 1-2 g topically See admin instructions. Amantadine 8%,Diclofenac 3%, Baclofen 2%, Cyclobenzaprine 2%, Gabapentin 10%, Bupivacaine 2% #240 gm Apply 1-2 gms to affected area 3-4 times daily      . pantoprazole (PROTONIX) 40 MG tablet Take 40 mg by mouth daily.      . rosuvastatin (CRESTOR) 40 MG tablet Take 40 mg by mouth daily.      Marland Kitchen tiZANidine (ZANAFLEX) 2 MG tablet Take 1 tablet (2 mg total) by mouth at bedtime.  90 tablet  1  . traMADol (ULTRAM) 50 MG tablet Take 1 tablet (50 mg total) by mouth 2 (two) times daily.  90 tablet  1  . traMADol  (ULTRAM-ER) 200 MG 24 hr tablet Take 1 tablet (200 mg total) by mouth daily. Takes with morning dose of 50mg  tablet  90 tablet  1  . warfarin (COUMADIN) 2.5 MG tablet Take 2.5 mg by mouth daily.       No current facility-administered medications on file prior to visit.    BP 146/88  Pulse 72  Temp(Src) 97.7 F (36.5 C) (Oral)  Resp 16  Wt 274 lb 0.6 oz (124.304 kg)  BMI 34.25 kg/m2  SpO2 95%       Objective:   Physical Exam  Constitutional: He is oriented to person, place, and time. He appears well-developed and well-nourished. No distress.  HENT:  Head: Normocephalic and atraumatic.  Cardiovascular: Normal rate and regular rhythm.   No murmur heard. Pulmonary/Chest: Effort normal and breath sounds normal. No respiratory distress. He has no wheezes. He has no rales. He exhibits no tenderness.  Musculoskeletal: He exhibits no edema.  Neurological: He is alert and oriented to person, place, and time.  Skin: Skin is warm and dry.  Psychiatric: He has a normal mood and affect. His behavior is normal. Judgment and thought content normal.          Assessment & Plan:  xanaflex was refilled by Dr. Wynn Banker, pt notified.

## 2012-10-11 NOTE — Assessment & Plan Note (Addendum)
BP Readings from Last 3 Encounters:  10/11/12 146/88  09/16/12 142/81  09/02/12 146/74   BP remains above goal. Discussed low sodium diet. Will increase benazepril from 5mg  to 10mg .

## 2012-10-11 NOTE — Assessment & Plan Note (Signed)
Resolved

## 2012-10-11 NOTE — Assessment & Plan Note (Addendum)
Stable on synthroid. Continue current dose.  Plan TSH next visit.

## 2012-10-11 NOTE — Assessment & Plan Note (Signed)
Pt stopped testosterone rx.  Monitor.

## 2012-10-11 NOTE — Assessment & Plan Note (Addendum)
Clinically stable. Check A1c. Declines foot exam.

## 2012-10-11 NOTE — Telephone Encounter (Signed)
Rx request to pharmacy/SLS  

## 2012-10-11 NOTE — Patient Instructions (Addendum)
Please follow up in 1 month for blood pressure check. Increase benazapril from 5 mg to 10mg .  Complete blood work prior to leaving.

## 2012-10-13 ENCOUNTER — Encounter: Payer: Self-pay | Admitting: Family

## 2012-10-18 ENCOUNTER — Ambulatory Visit (INDEPENDENT_AMBULATORY_CARE_PROVIDER_SITE_OTHER): Payer: Medicare Other | Admitting: *Deleted

## 2012-10-18 DIAGNOSIS — Z7901 Long term (current) use of anticoagulants: Secondary | ICD-10-CM

## 2012-10-18 DIAGNOSIS — I4891 Unspecified atrial fibrillation: Secondary | ICD-10-CM

## 2012-10-18 LAB — POCT INR: INR: 3.1

## 2012-11-05 ENCOUNTER — Other Ambulatory Visit: Payer: Self-pay | Admitting: Family Medicine

## 2012-11-08 ENCOUNTER — Ambulatory Visit (INDEPENDENT_AMBULATORY_CARE_PROVIDER_SITE_OTHER): Payer: Medicare Other | Admitting: *Deleted

## 2012-11-08 ENCOUNTER — Encounter: Payer: Self-pay | Admitting: Family

## 2012-11-08 ENCOUNTER — Ambulatory Visit (INDEPENDENT_AMBULATORY_CARE_PROVIDER_SITE_OTHER): Payer: Medicare Other | Admitting: Family

## 2012-11-08 VITALS — BP 134/76 | HR 72 | Temp 98.2°F | Resp 16 | Ht 75.0 in | Wt 271.1 lb

## 2012-11-08 DIAGNOSIS — I10 Essential (primary) hypertension: Secondary | ICD-10-CM | POA: Diagnosis not present

## 2012-11-08 DIAGNOSIS — I4891 Unspecified atrial fibrillation: Secondary | ICD-10-CM | POA: Diagnosis not present

## 2012-11-08 DIAGNOSIS — Z7901 Long term (current) use of anticoagulants: Secondary | ICD-10-CM | POA: Diagnosis not present

## 2012-11-08 LAB — BASIC METABOLIC PANEL
BUN: 19 mg/dL (ref 6–23)
CO2: 28 mEq/L (ref 19–32)
Calcium: 9.5 mg/dL (ref 8.4–10.5)
Creat: 1.19 mg/dL (ref 0.50–1.35)

## 2012-11-08 MED ORDER — BENAZEPRIL HCL 10 MG PO TABS: 10.0000 mg | ORAL_TABLET | Freq: Every day | ORAL | Status: DC

## 2012-11-08 NOTE — Progress Notes (Signed)
Subjective:    Patient ID: [name], male    DOB: 1946/01/12, 67 y.o.   MRN: [id number]  HPI  [name] is a 67 yr old male who presents today for follow up of his hypertension. Last visit his benazepril was increased from 5mg  to 10mg .  He denies cough, CP/SOB.  He reports chronic LLE swelling which is stable.   BP Readings from Last 3 Encounters:  11/08/12 134/76  10/11/12 146/88  09/16/12 142/81      Review of Systems See HPI  Past Medical History  Diagnosis Date  . GERD (gastroesophageal reflux disease)   . Gout   . Hyperlipidemia   . Hypertension   . CVA (cerebral infarction)     left hemiparesis  . Chronic pain     left sided-Kristeins  . CAD (coronary artery disease)     s/p CABG, s/p DES to LCX  January 2011  . OSA on CPAP   . Sleep apnea   . History of colonoscopy     History   Social History  . Marital Status: Married    Spouse Name: [name]    Number of Children: N/A  . Years of Education: N/A   Occupational History  . retired Web designer   Social History Main Topics  . Smoking status: Never Smoker   . Smokeless tobacco: Never Used  . Alcohol Use: No  . Drug Use: No  . Sexual Activity: Yes   Other Topics Concern  . Not on file   Social History Narrative  . No narrative on file    Past Surgical History  Procedure Laterality Date  . Coronary artery bypass graft      stent  . Esophagogastroduodenoscopy  03-25-2005  . Nasal septum surgery    . Percutaneous placement intravascular stent cervical carotid artery      03-2009; using a drug-eluting platform of the circumflex cornoray artery with a 3.0 x 18 [employer] Promus drug-eluting platform post dilated to 3.75 with a noncompliant balloon.  . Cataract extraction  05/2010    left eye  . Cataract extraction  04/2010    right eye  . Cardioversion  06/20/2011    Procedure: CARDIOVERSION;  Surgeon: Laurey Morale, MD;  Location: [facility] ENDOSCOPY;  Service: Cardiovascular;   Laterality: N/A;  . Tee without cardioversion  06/20/2011    Procedure: TRANSESOPHAGEAL ECHOCARDIOGRAM (TEE);  Surgeon: Laurey Morale, MD;  Location: [facility] ENDOSCOPY;  Service: Cardiovascular;  Laterality: N/A;    Family History  Problem Relation Age of Onset  . Lung cancer Father     deceased  . Stroke Mother     deceased-MINISTROKES    No Known Allergies  Current Outpatient Prescriptions on File Prior to Visit  Medication Sig Dispense Refill  . allopurinol (ZYLOPRIM) 100 MG tablet Take 200 mg by mouth daily.      Marland Kitchen allopurinol (ZYLOPRIM) 100 MG tablet TAKE 2 TABLETS BY MOUTH EVERY DAY  180 tablet  0  . aspirin 81 MG tablet Take 81 mg by mouth daily.       . CRESTOR 40 MG tablet TAKE 1 TABLET BY MOUTH EVERY DAY  90 tablet  1  . diltiazem (DILACOR XR) 180 MG 24 hr capsule Take 180 mg by mouth daily.       . furosemide (LASIX) 40 MG tablet Take 40 mg by mouth every other day.       . levothyroxine (SYNTHROID, LEVOTHROID) 112 MCG tablet  Take 112 mcg by mouth daily.      . metFORMIN (GLUCOPHAGE) 500 MG tablet Take 500 mg by mouth daily with breakfast. Take 500 mg by mouth daily with breakfast.      . nitroGLYCERIN (NITROSTAT) 0.4 MG SL tablet Place 0.4 mg under the tongue every 5 (five) minutes as needed. For chest pain Up to 3 doses      . NONFORMULARY OR COMPOUNDED ITEM Apply 1-2 g topically See admin instructions. Amantadine 8%,Diclofenac 3%, Baclofen 2%, Cyclobenzaprine 2%, Gabapentin 10%, Bupivacaine 2% #240 gm Apply 1-2 gms to affected area 3-4 times daily      . pantoprazole (PROTONIX) 40 MG tablet Take 40 mg by mouth daily.      Marland Kitchen tiZANidine (ZANAFLEX) 2 MG tablet Take 1 tablet (2 mg total) by mouth at bedtime.  90 tablet  1  . traMADol (ULTRAM) 50 MG tablet Take 1 tablet (50 mg total) by mouth 2 (two) times daily.  90 tablet  1  . traMADol (ULTRAM-ER) 200 MG 24 hr tablet Take 1 tablet (200 mg total) by mouth daily. Takes with morning dose of 50mg  tablet  90 tablet  1  . warfarin  (COUMADIN) 2.5 MG tablet Take 2.5 mg by mouth daily.       No current facility-administered medications on file prior to visit.    BP 134/76  Pulse 72  Temp(Src) 98.2 F (36.8 C) (Oral)  Resp 16  Ht 6\' 3"  (1.905 m)  Wt 271 lb 1.9 oz (122.979 kg)  BMI 33.89 kg/m2  SpO2 98%       Objective:   Physical Exam  Constitutional: He is oriented to person, place, and time. He appears well-developed and well-nourished. No distress.  Cardiovascular: Normal rate and regular rhythm.   No murmur heard. Pulmonary/Chest: Effort normal and breath sounds normal. No respiratory distress. He has no wheezes. He has no rales. He exhibits no tenderness.  Musculoskeletal:  Trace LLE swelling.   Neurological: He is alert and oriented to person, place, and time.  Psychiatric: He has a normal mood and affect. His behavior is normal. Judgment and thought content normal.          Assessment & Plan:

## 2012-11-08 NOTE — Patient Instructions (Addendum)
Please complete your lab work prior to leaving. Follow up in 3 months.   

## 2012-11-08 NOTE — Assessment & Plan Note (Signed)
Improved on benazepril 10 mg.  Continue same, obtain follow up bmet.

## 2012-11-09 ENCOUNTER — Encounter: Payer: Self-pay | Admitting: Family

## 2012-11-12 ENCOUNTER — Telehealth: Payer: Self-pay | Admitting: *Deleted

## 2012-11-12 NOTE — Telephone Encounter (Signed)
Notified pt and he voices understanding. Transferred pt to scheduler to arrange appt.

## 2012-11-12 NOTE — Telephone Encounter (Signed)
Received call from pt stating he has had episodes of fatigue since his last visit.  Reports these episodes occur if he walks for more than 15 minutes. He becomes very fatigued and needs to rest. Pt denies shortness of breath or pain. Reports that he drinks plenty of fluids. Denies diarrhea, vomiting or palpitations. Pt reports that his BP reading at home today was 133/73 and his pulse was 93 (higher than it has been in our office). Pt wants to know if he should be referred to a cardiologist?  Please advise.

## 2012-11-12 NOTE — Telephone Encounter (Signed)
Lets re-evaluate him in the office on Tuesday. He should go to ER over the weekend if he has SOB, chest pain.

## 2012-11-16 ENCOUNTER — Encounter: Payer: Self-pay | Admitting: Family

## 2012-11-16 ENCOUNTER — Ambulatory Visit (INDEPENDENT_AMBULATORY_CARE_PROVIDER_SITE_OTHER): Payer: Medicare Other | Admitting: Family

## 2012-11-16 ENCOUNTER — Ambulatory Visit (HOSPITAL_BASED_OUTPATIENT_CLINIC_OR_DEPARTMENT_OTHER)
Admission: RE | Admit: 2012-11-16 | Discharge: 2012-11-16 | Disposition: A | Discharge status: Home / Self Care | Payer: Medicare Other | Source: Ambulatory Visit | Attending: Family | Admitting: Family

## 2012-11-16 VITALS — BP 120/80 | HR 84 | Temp 98.4°F | Wt 269.1 lb

## 2012-11-16 DIAGNOSIS — R209 Unspecified disturbances of skin sensation: Secondary | ICD-10-CM

## 2012-11-16 DIAGNOSIS — Z8673 Personal history of transient ischemic attack (TIA), and cerebral infarction without residual deficits: Secondary | ICD-10-CM | POA: Diagnosis not present

## 2012-11-16 DIAGNOSIS — R200 Anesthesia of skin: Secondary | ICD-10-CM

## 2012-11-16 DIAGNOSIS — R5381 Other malaise: Secondary | ICD-10-CM

## 2012-11-16 LAB — CBC WITH DIFFERENTIAL/PLATELET
Basophils Absolute: 0.1 10*3/uL (ref 0.0–0.1)
Basophils Relative: 2 % — ABNORMAL HIGH (ref 0–1)
Eosinophils Absolute: 0.5 10*3/uL (ref 0.0–0.7)
Eosinophils Relative: 8 % — ABNORMAL HIGH (ref 0–5)
Lymphs Abs: 1.8 10*3/uL (ref 0.7–4.0)
MCH: 27.4 pg (ref 26.0–34.0)
MCV: 82.5 fL (ref 78.0–100.0)
Neutrophils Relative %: 53 % (ref 43–77)
Platelets: 189 10*3/uL (ref 150–400)
RBC: 5.08 MIL/uL (ref 4.22–5.81)
RDW: 14.3 % (ref 11.5–15.5)

## 2012-11-16 NOTE — Patient Instructions (Addendum)
Please complete your CT scan on the first floor. You will be contacted about your follow up with cardiology. Follow up in 1 month.

## 2012-11-16 NOTE — Progress Notes (Signed)
Subjective:    Patient ID: [name], male    DOB: 1946-02-09, 67 y.o.   MRN: [id number]  HPI  [name] is a 67 yr old male who presents today with chief complaint of fatigue.  He reports that over the weekend he became constipated.  He finally used a stool softner and had a BM. Denies current concern re: chest pain. He reports that he has an annoying numbness which is with him always in the left arm and left leg.  Feels like the numbness is more pronounced.  He feels like he is continuing to drag his left foot.  Seems to be dragging it more.    Denies CP/SOB.  Does report mild wheezing when he bends over to tie shoes.  Feels fatigued.    Review of Systems    see HPI  Past Medical History  Diagnosis Date  . GERD (gastroesophageal reflux disease)   . Gout   . Hyperlipidemia   . Hypertension   . CVA (cerebral infarction)     left hemiparesis  . Chronic pain     left sided-Kristeins  . CAD (coronary artery disease)     s/p CABG, s/p DES to LCX  January 2011  . OSA on CPAP   . Sleep apnea   . History of colonoscopy     History   Social History  . Marital Status: Married    Spouse Name: [name]    Number of Children: N/A  . Years of Education: N/A   Occupational History  . retired Web designer   Social History Main Topics  . Smoking status: Never Smoker   . Smokeless tobacco: Never Used  . Alcohol Use: No  . Drug Use: No  . Sexual Activity: Yes   Other Topics Concern  . Not on file   Social History Narrative  . No narrative on file    Past Surgical History  Procedure Laterality Date  . Coronary artery bypass graft      stent  . Esophagogastroduodenoscopy  03-25-2005  . Nasal septum surgery    . Percutaneous placement intravascular stent cervical carotid artery      03-2009; using a drug-eluting platform of the circumflex cornoray artery with a 3.0 x 18 [employer] Promus drug-eluting platform post dilated to 3.75 with a noncompliant  balloon.  . Cataract extraction  05/2010    left eye  . Cataract extraction  04/2010    right eye  . Cardioversion  06/20/2011    Procedure: CARDIOVERSION;  Surgeon: Laurey Morale, MD;  Location: [facility] ENDOSCOPY;  Service: Cardiovascular;  Laterality: N/A;  . Tee without cardioversion  06/20/2011    Procedure: TRANSESOPHAGEAL ECHOCARDIOGRAM (TEE);  Surgeon: Laurey Morale, MD;  Location: [facility] ENDOSCOPY;  Service: Cardiovascular;  Laterality: N/A;    Family History  Problem Relation Age of Onset  . Lung cancer Father     deceased  . Stroke Mother     deceased-MINISTROKES    No Known Allergies  Current Outpatient Prescriptions on File Prior to Visit  Medication Sig Dispense Refill  . allopurinol (ZYLOPRIM) 100 MG tablet Take 200 mg by mouth daily.      Marland Kitchen allopurinol (ZYLOPRIM) 100 MG tablet TAKE 2 TABLETS BY MOUTH EVERY DAY  180 tablet  0  . aspirin 81 MG tablet Take 81 mg by mouth daily.       . benazepril (LOTENSIN) 10 MG tablet Take 1 tablet (10 mg total) by  mouth daily.  90 tablet  1  . CRESTOR 40 MG tablet TAKE 1 TABLET BY MOUTH EVERY DAY  90 tablet  1  . diltiazem (DILACOR XR) 180 MG 24 hr capsule Take 180 mg by mouth daily.       . furosemide (LASIX) 40 MG tablet Take 40 mg by mouth every other day.       . levothyroxine (SYNTHROID, LEVOTHROID) 112 MCG tablet Take 112 mcg by mouth daily.      . metFORMIN (GLUCOPHAGE) 500 MG tablet Take 500 mg by mouth daily with breakfast. Take 500 mg by mouth daily with breakfast.      . nitroGLYCERIN (NITROSTAT) 0.4 MG SL tablet Place 0.4 mg under the tongue every 5 (five) minutes as needed. For chest pain Up to 3 doses      . NONFORMULARY OR COMPOUNDED ITEM Apply 1-2 g topically See admin instructions. Amantadine 8%,Diclofenac 3%, Baclofen 2%, Cyclobenzaprine 2%, Gabapentin 10%, Bupivacaine 2% #240 gm Apply 1-2 gms to affected area 3-4 times daily      . pantoprazole (PROTONIX) 40 MG tablet Take 40 mg by mouth daily.      Marland Kitchen tiZANidine (ZANAFLEX)  2 MG tablet Take 1 tablet (2 mg total) by mouth at bedtime.  90 tablet  1  . traMADol (ULTRAM) 50 MG tablet Take 1 tablet (50 mg total) by mouth 2 (two) times daily.  90 tablet  1  . traMADol (ULTRAM-ER) 200 MG 24 hr tablet Take 1 tablet (200 mg total) by mouth daily. Takes with morning dose of 50mg  tablet  90 tablet  1  . warfarin (COUMADIN) 2.5 MG tablet Take 2.5 mg by mouth daily.       No current facility-administered medications on file prior to visit.    BP 120/80  Pulse 84  Temp(Src) 98.4 F (36.9 C) (Oral)  Wt 269 lb 1.9 oz (122.072 kg)  BMI 33.64 kg/m2  SpO2 95%    Objective:   Physical Exam  Constitutional: He is oriented to person, place, and time. He appears well-developed and well-nourished. No distress.  Cardiovascular: Normal rate and regular rhythm.   No murmur heard. Pulmonary/Chest: Effort normal and breath sounds normal. No respiratory distress. He has no wheezes. He has no rales. He exhibits no tenderness.  Neurological: He is alert and oriented to person, place, and time.  Left arm slightly contracted.  Unsteady gait.   Psychiatric: He has a normal mood and affect. His behavior is normal. Judgment and thought content normal.          Assessment & Plan:

## 2012-11-17 ENCOUNTER — Ambulatory Visit (INDEPENDENT_AMBULATORY_CARE_PROVIDER_SITE_OTHER): Payer: Medicare Other | Admitting: Cardiology

## 2012-11-17 ENCOUNTER — Other Ambulatory Visit: Payer: Self-pay | Admitting: Family Medicine

## 2012-11-17 ENCOUNTER — Encounter: Payer: Self-pay | Admitting: Cardiology

## 2012-11-17 ENCOUNTER — Other Ambulatory Visit: Payer: Self-pay | Admitting: Family

## 2012-11-17 VITALS — BP 132/86 | HR 74 | Ht 75.0 in | Wt 268.0 lb

## 2012-11-17 DIAGNOSIS — R5381 Other malaise: Secondary | ICD-10-CM | POA: Insufficient documentation

## 2012-11-17 DIAGNOSIS — R5383 Other fatigue: Secondary | ICD-10-CM

## 2012-11-17 DIAGNOSIS — E785 Hyperlipidemia, unspecified: Secondary | ICD-10-CM

## 2012-11-17 DIAGNOSIS — I4891 Unspecified atrial fibrillation: Secondary | ICD-10-CM

## 2012-11-17 DIAGNOSIS — R0609 Other forms of dyspnea: Secondary | ICD-10-CM

## 2012-11-17 DIAGNOSIS — R0989 Other specified symptoms and signs involving the circulatory and respiratory systems: Secondary | ICD-10-CM

## 2012-11-17 DIAGNOSIS — I2510 Atherosclerotic heart disease of native coronary artery without angina pectoris: Secondary | ICD-10-CM | POA: Diagnosis not present

## 2012-11-17 DIAGNOSIS — R531 Weakness: Secondary | ICD-10-CM | POA: Insufficient documentation

## 2012-11-17 LAB — TSH: TSH: 1.472 u[IU]/mL (ref 0.350–4.500)

## 2012-11-17 NOTE — Assessment & Plan Note (Signed)
Cbc and TSH stable.  Continue current dose of synthroid.  Depression is a consideration if symptoms do not improve.

## 2012-11-17 NOTE — Patient Instructions (Addendum)
Your physician has requested that you have an echocardiogram. Echocardiography is a painless test that uses sound waves to create images of your heart. It provides your doctor with information about the size and shape of your heart and how well your heart's chambers and valves are working. This procedure takes approximately one hour. There are no restrictions for this procedure. In the next week or so.   Your physician wants you to follow-up in: 6 months with Dr Shirlee Latch. (March 2015). You will receive a reminder letter in the mail two months in advance. If you don't receive a letter, please call our office to schedule the follow-up appointment.

## 2012-11-17 NOTE — Assessment & Plan Note (Signed)
A follow up head CT is performed due to complaint of worsening left sided numbness. CT is negative for acute changes.

## 2012-11-17 NOTE — Progress Notes (Signed)
Patient ID: [name], male   DOB: August 19, 1945, 67 y.o.   MRN: [id number] PCP: Dr. Rodena Medin  67 yo with history of CAD s/p CABG and paroxysmal atrial fibrillation with history of prior CVA returns for followup.  He remains in NSR today. Lexiscan myoview in 8/13 showed no ischemia and normal EF.  He has some residual gait difficulty because of his stroke which seems to be getting a bit worse.  He feels weak/worn out in general.  No chest pain, no dyspnea, no bendopnea/orthopnea/PND.  He has not felt his heart racing.  He does not get much exercise.  Weight is up 11 lbs since I last saw him.   Labs (4/13): K 5, creatinine 1.11, LDL 32, HDL 30 Labs (8/13): HCT 39.1, TSH normal Labs (7/14): LDL 43, HDL 46 Labs (8/14): K 4.4, creatinine 1.19 Labs (9/14): TSH normal, HCT 41.9  ECG: NSR, inferior nonspecific T wave inversions  PMH: 1. GERD 2. Gout 3. Hypothyroidism 4. HTN 5. H/o CVA: some residual left-sided weakness.  6. Borderline DM2 7. CAD s/p CABG in 2004.  LHC in 2011: SVG-RCA and SVG-OM totally occluded.  LIMA-LAD patent.  Patient had DES to CFX, flow down native RCA was ok.  Lexiscan myoview (8/13): EF 62%, small mild fixed inferior defect with no ischemia . 8. OSA: Intolerant of CPAP, wears oxygen at night.  9. Paroxysmal atrial fibrillation. TEE (4/13) with EF 55-60%, no significant valvular abnormalities.   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  Nonsmoker.  Lives in [place].    FH: No premature CAD.   ROS: All systems reviewed and negative except as per HPI.   Current Outpatient Prescriptions  Medication Sig Dispense Refill  . allopurinol (ZYLOPRIM) 100 MG tablet Take 200 mg by mouth daily.      Marland Kitchen allopurinol (ZYLOPRIM) 100 MG tablet TAKE 2 TABLETS BY MOUTH EVERY DAY  180 tablet  0  . aspirin 81 MG tablet Take 81 mg by mouth daily.       . benazepril (LOTENSIN) 10 MG tablet Take 1 tablet (10 mg total) by mouth daily.  90 tablet  1  .  CRESTOR 40 MG tablet TAKE 1 TABLET BY MOUTH EVERY DAY  90 tablet  1  . diltiazem (DILACOR XR) 180 MG 24 hr capsule Take 180 mg by mouth daily.       . furosemide (LASIX) 40 MG tablet Take 40 mg by mouth every other day.       . levothyroxine (SYNTHROID, LEVOTHROID) 112 MCG tablet Take 112 mcg by mouth daily.      . metFORMIN (GLUCOPHAGE) 500 MG tablet Take 500 mg by mouth daily with breakfast. Take 500 mg by mouth daily with breakfast.      . nitroGLYCERIN (NITROSTAT) 0.4 MG SL tablet Place 0.4 mg under the tongue every 5 (five) minutes as needed. For chest pain Up to 3 doses      . NONFORMULARY OR COMPOUNDED ITEM Apply 1-2 g topically See admin instructions. Amantadine 8%,Diclofenac 3%, Baclofen 2%, Cyclobenzaprine 2%, Gabapentin 10%, Bupivacaine 2% #240 gm Apply 1-2 gms to affected area 3-4 times daily      . pantoprazole (PROTONIX) 40 MG tablet Take 40 mg by mouth daily.      Marland Kitchen tiZANidine (ZANAFLEX) 2 MG tablet Take 1 tablet (2 mg total) by mouth at bedtime.  90 tablet  1  . traMADol (ULTRAM) 50 MG tablet Take 1 tablet (50 mg total) by mouth 2 (  two) times daily.  90 tablet  1  . traMADol (ULTRAM-ER) 200 MG 24 hr tablet Take 1 tablet (200 mg total) by mouth daily. Takes with morning dose of 50mg  tablet  90 tablet  1  . warfarin (COUMADIN) 2.5 MG tablet Take 2.5 mg by mouth daily.      Marland Kitchen allopurinol (ZYLOPRIM) 100 MG tablet TAKE 2 TABLETS BY MOUTH EVERY DAY  180 tablet  1  . CRESTOR 40 MG tablet TAKE 1 TABLET BY MOUTH EVERY DAY  90 tablet  1   No current facility-administered medications for this visit.    BP 132/86  Pulse 74  Ht 6\' 3"  (1.905 m)  Wt 121.564 kg (268 lb)  BMI 33.5 kg/m2  SpO2 98% General: NAD, overweight Neck: Thick, no JVD, no thyromegaly or thyroid nodule.  Lungs: Clear to auscultation bilaterally with normal respiratory effort. CV: Nondisplaced PMI.  Heart regular S1/S2, no S3/S4, no murmur.  1+ ankle edema.  No carotid bruit.  Normal pedal pulses.  Abdomen: Soft,  nontender, no hepatosplenomegaly, no distention.  Neurologic: Alert and oriented x 3.  Psych: Normal affect. Extremities: No clubbing or cyanosis.   Assessment/Plan: Fatigue CBC and TSH normal.  Lexiscan myoview was normal in 8/13.  He is not volume overloaded on exam. He does have OSA and has not tolerated CPAP.  He has also gained 11 lbs in the last year which may be contributing.  I asked him to try to increase exercise level (consider [facility] program [facility]).  I will get an echo to make sure that LV and RV function remain normal.  HYPERLIPIDEMIA  LDL at goal when recently checked.  CAD, NATIVE VESSEL  No chest pain exertional dyspnea. Lexiscan myoview in 8/13 with no ischemia. Continue ASA 81, statin, benazepril.  Atrial fibrillation  Paroxysmal atrial fibrillation. Patient had 1 episode prior to 4/13. The 4/13 episode was likely triggered by acute illness (flu). He is in NSR today. Continue diltiazem CD and warfarin.   [name] 11/17/2012

## 2012-11-22 ENCOUNTER — Ambulatory Visit (HOSPITAL_COMMUNITY): Payer: Medicare Other | Attending: Cardiology

## 2012-11-22 DIAGNOSIS — E785 Hyperlipidemia, unspecified: Secondary | ICD-10-CM | POA: Insufficient documentation

## 2012-11-22 DIAGNOSIS — I4891 Unspecified atrial fibrillation: Secondary | ICD-10-CM | POA: Insufficient documentation

## 2012-11-22 DIAGNOSIS — I2510 Atherosclerotic heart disease of native coronary artery without angina pectoris: Secondary | ICD-10-CM | POA: Insufficient documentation

## 2012-11-22 DIAGNOSIS — R0602 Shortness of breath: Secondary | ICD-10-CM

## 2012-11-22 DIAGNOSIS — I079 Rheumatic tricuspid valve disease, unspecified: Secondary | ICD-10-CM | POA: Diagnosis not present

## 2012-11-22 DIAGNOSIS — Z8673 Personal history of transient ischemic attack (TIA), and cerebral infarction without residual deficits: Secondary | ICD-10-CM | POA: Diagnosis not present

## 2012-11-22 DIAGNOSIS — R0989 Other specified symptoms and signs involving the circulatory and respiratory systems: Secondary | ICD-10-CM | POA: Insufficient documentation

## 2012-11-22 DIAGNOSIS — R5381 Other malaise: Secondary | ICD-10-CM | POA: Diagnosis not present

## 2012-11-22 DIAGNOSIS — I059 Rheumatic mitral valve disease, unspecified: Secondary | ICD-10-CM | POA: Insufficient documentation

## 2012-11-22 DIAGNOSIS — R0609 Other forms of dyspnea: Secondary | ICD-10-CM | POA: Diagnosis not present

## 2012-11-22 NOTE — Progress Notes (Signed)
Echocardiogram performed.  

## 2012-11-23 ENCOUNTER — Other Ambulatory Visit: Payer: Self-pay | Admitting: Internal Medicine

## 2012-11-29 ENCOUNTER — Ambulatory Visit (INDEPENDENT_AMBULATORY_CARE_PROVIDER_SITE_OTHER): Payer: Medicare Other

## 2012-11-29 ENCOUNTER — Other Ambulatory Visit: Payer: Self-pay | Admitting: Internal Medicine

## 2012-11-29 DIAGNOSIS — H35379 Puckering of macula, unspecified eye: Secondary | ICD-10-CM | POA: Diagnosis not present

## 2012-11-29 DIAGNOSIS — I4891 Unspecified atrial fibrillation: Secondary | ICD-10-CM | POA: Diagnosis not present

## 2012-11-29 DIAGNOSIS — H43819 Vitreous degeneration, unspecified eye: Secondary | ICD-10-CM | POA: Diagnosis not present

## 2012-11-29 DIAGNOSIS — Z7901 Long term (current) use of anticoagulants: Secondary | ICD-10-CM | POA: Diagnosis not present

## 2012-11-29 DIAGNOSIS — H43399 Other vitreous opacities, unspecified eye: Secondary | ICD-10-CM | POA: Diagnosis not present

## 2012-12-05 ENCOUNTER — Other Ambulatory Visit: Payer: Self-pay | Admitting: Physical Medicine and Rehabilitation

## 2012-12-06 ENCOUNTER — Other Ambulatory Visit: Payer: Self-pay | Admitting: *Deleted

## 2012-12-06 MED ORDER — TRAMADOL HCL 50 MG PO TABS: 50.0000 mg | ORAL_TABLET | Freq: Two times a day (BID) | ORAL | Status: DC

## 2012-12-06 MED ORDER — TRAMADOL HCL ER 200 MG PO TB24: 200.0000 mg | ORAL_TABLET | Freq: Every day | ORAL | Status: DC

## 2012-12-06 NOTE — Telephone Encounter (Signed)
[name] came in because the pharmacy was denying his tramdol because of the change to C IV. I explained to him that it is a one time thing and we can call it in he does not have to have a written rx but we cannot "e prescribe" any longer. I called new orders in to his pharmacy for him.

## 2012-12-10 DIAGNOSIS — H52229 Regular astigmatism, unspecified eye: Secondary | ICD-10-CM | POA: Diagnosis not present

## 2012-12-10 DIAGNOSIS — E119 Type 2 diabetes mellitus without complications: Secondary | ICD-10-CM | POA: Diagnosis not present

## 2012-12-10 DIAGNOSIS — H524 Presbyopia: Secondary | ICD-10-CM | POA: Diagnosis not present

## 2012-12-10 DIAGNOSIS — H5231 Anisometropia: Secondary | ICD-10-CM | POA: Diagnosis not present

## 2012-12-15 ENCOUNTER — Other Ambulatory Visit: Payer: Self-pay | Admitting: Internal Medicine

## 2012-12-16 ENCOUNTER — Ambulatory Visit (HOSPITAL_BASED_OUTPATIENT_CLINIC_OR_DEPARTMENT_OTHER): Payer: Medicare Other | Admitting: Physical Medicine & Rehabilitation

## 2012-12-16 ENCOUNTER — Encounter: Payer: Self-pay | Admitting: Physical Medicine & Rehabilitation

## 2012-12-16 ENCOUNTER — Encounter: Payer: Medicare Other | Attending: Physical Medicine & Rehabilitation

## 2012-12-16 VITALS — BP 153/83 | HR 86 | Resp 14 | Ht 75.0 in | Wt 274.2 lb

## 2012-12-16 DIAGNOSIS — G8110 Spastic hemiplegia affecting unspecified side: Secondary | ICD-10-CM | POA: Insufficient documentation

## 2012-12-16 NOTE — Patient Instructions (Signed)

## 2012-12-16 NOTE — Progress Notes (Signed)
Botox Injection for spasticity using needle EMG guidance and E stim Dilution: 50 Units/ml  Indication: Severe spasticity which interferes with ADL,mobility and/or hygiene and is unresponsive to medication management and other conservative care  Informed consent was obtained after describing risks and benefits of the procedure with the patient. This includes bleeding, bruising, infection, excessive weakness, or medication side effects. A REMS form is on file and signed.  Needle: 26g,2" needle electrode  Number of units per muscle  Pectoralis0  Biceps0  FCR50  FCU0  FDS50  FDP0  Gastrosoleus0  Hamstrings 200   All injections were done after obtaining appropriate EMG activity and E stinm and after negative drawback for blood. The patient tolerated the procedure well. Post procedure instructions were given. A followup appointment was made.     Patient would like to try a higher dose in the left upper extremity to see if the duration of the effect can be prolonged

## 2012-12-17 ENCOUNTER — Ambulatory Visit (INDEPENDENT_AMBULATORY_CARE_PROVIDER_SITE_OTHER): Payer: Medicare Other | Admitting: Family

## 2012-12-17 ENCOUNTER — Telehealth: Payer: Self-pay | Admitting: Family

## 2012-12-17 ENCOUNTER — Encounter: Payer: Self-pay | Admitting: Family

## 2012-12-17 VITALS — BP 138/78 | HR 78 | Temp 98.5°F | Resp 16 | Wt 271.0 lb

## 2012-12-17 DIAGNOSIS — Z23 Encounter for immunization: Secondary | ICD-10-CM

## 2012-12-17 DIAGNOSIS — J209 Acute bronchitis, unspecified: Secondary | ICD-10-CM | POA: Diagnosis not present

## 2012-12-17 MED ORDER — AMOXICILLIN-POT CLAVULANATE 875-125 MG PO TABS: 1.0000 | ORAL_TABLET | Freq: Two times a day (BID) | ORAL | Status: DC

## 2012-12-17 NOTE — Assessment & Plan Note (Addendum)
Will continue augmentin.  Pt instructed to follow up if symptoms worsen or if symptoms do not improve.

## 2012-12-17 NOTE — Patient Instructions (Signed)
Please continue augmentin. Call if symptoms worsen, or if not improved in 1 week.

## 2012-12-17 NOTE — Telephone Encounter (Addendum)
[name]  I started [name] on Augmentin on 10/3 and wanted you to be aware since he is following in your clinic for coumadin managment.   Thanks,  [employer]

## 2012-12-17 NOTE — Progress Notes (Signed)
Subjective:    Patient ID: [name], male    DOB: April 22, 1945, 67 y.o.   MRN: [id number]  HPI  [name] is a 67 yr old male who presents today with chief complaint of cough. Cough started 4 days ago.  Cough is productive of green sputum.  Reports that he got a "strong antibiotic" from his daughter.  Reports "coughing fits."  Last a few minutes. Reports associated hoarseness. Denies associated fever.  Some improvement with abx and halls.  Review of Systems    see HPI  Past Medical History  Diagnosis Date  . GERD (gastroesophageal reflux disease)   . Gout   . Hyperlipidemia   . Hypertension   . CVA (cerebral infarction)     left hemiparesis  . Chronic pain     left sided-Kristeins  . CAD (coronary artery disease)     s/p CABG, s/p DES to LCX  January 2011  . OSA on CPAP   . Sleep apnea   . History of colonoscopy     History   Social History  . Marital Status: Married    Spouse Name: [name]    Number of Children: N/A  . Years of Education: N/A   Occupational History  . retired Web designer   Social History Main Topics  . Smoking status: Never Smoker   . Smokeless tobacco: Never Used  . Alcohol Use: No  . Drug Use: No  . Sexual Activity: Yes   Other Topics Concern  . Not on file   Social History Narrative  . No narrative on file    Past Surgical History  Procedure Laterality Date  . Coronary artery bypass graft      stent  . Esophagogastroduodenoscopy  03-25-2005  . Nasal septum surgery    . Percutaneous placement intravascular stent cervical carotid artery      03-2009; using a drug-eluting platform of the circumflex cornoray artery with a 3.0 x 18 [employer] Promus drug-eluting platform post dilated to 3.75 with a noncompliant balloon.  . Cataract extraction  05/2010    left eye  . Cataract extraction  04/2010    right eye  . Cardioversion  06/20/2011    Procedure: CARDIOVERSION;  Surgeon: Laurey Morale, MD;  Location: [facility]  ENDOSCOPY;  Service: Cardiovascular;  Laterality: N/A;  . Tee without cardioversion  06/20/2011    Procedure: TRANSESOPHAGEAL ECHOCARDIOGRAM (TEE);  Surgeon: Laurey Morale, MD;  Location: [facility] ENDOSCOPY;  Service: Cardiovascular;  Laterality: N/A;    Family History  Problem Relation Age of Onset  . Lung cancer Father     deceased  . Stroke Mother     deceased-MINISTROKES    No Known Allergies  Current Outpatient Prescriptions on File Prior to Visit  Medication Sig Dispense Refill  . allopurinol (ZYLOPRIM) 100 MG tablet TAKE 2 TABLETS BY MOUTH EVERY DAY  180 tablet  1  . aspirin 81 MG tablet Take 81 mg by mouth daily.       . benazepril (LOTENSIN) 10 MG tablet Take 1 tablet (10 mg total) by mouth daily.  90 tablet  1  . CRESTOR 40 MG tablet TAKE 1 TABLET BY MOUTH EVERY DAY  90 tablet  1  . diltiazem (DILACOR XR) 180 MG 24 hr capsule Take 1 capsule (180 mg total) by mouth daily.  90 capsule  1  . furosemide (LASIX) 40 MG tablet Take 40 mg by mouth every other day.       Marland Kitchen  levothyroxine (SYNTHROID, LEVOTHROID) 112 MCG tablet TAKE 1 TABLET (112 MCG TOTAL) BY MOUTH DAILY.  90 tablet  1  . metFORMIN (GLUCOPHAGE) 500 MG tablet Take 500 mg by mouth daily with breakfast. Take 500 mg by mouth daily with breakfast.      . nitroGLYCERIN (NITROSTAT) 0.4 MG SL tablet Place 0.4 mg under the tongue every 5 (five) minutes as needed. For chest pain Up to 3 doses      . NONFORMULARY OR COMPOUNDED ITEM Apply 1-2 g topically See admin instructions. Amantadine 8%,Diclofenac 3%, Baclofen 2%, Cyclobenzaprine 2%, Gabapentin 10%, Bupivacaine 2% #240 gm Apply 1-2 gms to affected area 3-4 times daily      . pantoprazole (PROTONIX) 40 MG tablet Take 40 mg by mouth daily.      Marland Kitchen tiZANidine (ZANAFLEX) 2 MG tablet Take 1 tablet (2 mg total) by mouth at bedtime.  90 tablet  1  . traMADol (ULTRAM) 50 MG tablet Take 1 tablet (50 mg total) by mouth 2 (two) times daily.  180 tablet  1  . traMADol (ULTRAM-ER) 200 MG 24 hr  tablet Take 1 tablet (200 mg total) by mouth daily. Takes with morning dose of 50mg  tablet  90 tablet  1  . warfarin (COUMADIN) 2.5 MG tablet Take 2.5 mg by mouth daily.       No current facility-administered medications on file prior to visit.    BP 138/78  Pulse 78  Temp(Src) 98.5 F (36.9 C) (Oral)  Resp 16  Wt 271 lb (122.925 kg)  BMI 33.87 kg/m2  SpO2 97%    Objective:   Physical Exam  Constitutional: He is oriented to person, place, and time. He appears well-developed and well-nourished. No distress.  HENT:  Head: Normocephalic and atraumatic.  Right Ear: Tympanic membrane and ear canal normal.  Left Ear: Tympanic membrane and ear canal normal.  Mouth/Throat: No oropharyngeal exudate, posterior oropharyngeal edema or posterior oropharyngeal erythema.  Cardiovascular: Normal rate and regular rhythm.   No murmur heard. Pulmonary/Chest: Effort normal and breath sounds normal. No respiratory distress. He has no wheezes. He has no rales. He exhibits no tenderness.  Musculoskeletal: He exhibits no edema.  Neurological: He is alert and oriented to person, place, and time.  Psychiatric: He has a normal mood and affect. His behavior is normal. Judgment and thought content normal.          Assessment & Plan:

## 2012-12-22 DIAGNOSIS — L608 Other nail disorders: Secondary | ICD-10-CM | POA: Diagnosis not present

## 2012-12-22 DIAGNOSIS — L84 Corns and callosities: Secondary | ICD-10-CM | POA: Diagnosis not present

## 2012-12-22 DIAGNOSIS — I739 Peripheral vascular disease, unspecified: Secondary | ICD-10-CM | POA: Diagnosis not present

## 2012-12-23 ENCOUNTER — Ambulatory Visit (INDEPENDENT_AMBULATORY_CARE_PROVIDER_SITE_OTHER): Payer: Medicare Other | Admitting: *Deleted

## 2012-12-23 DIAGNOSIS — Z7901 Long term (current) use of anticoagulants: Secondary | ICD-10-CM | POA: Diagnosis not present

## 2012-12-23 DIAGNOSIS — I4891 Unspecified atrial fibrillation: Secondary | ICD-10-CM | POA: Diagnosis not present

## 2012-12-23 LAB — POCT INR: INR: 3.4

## 2013-01-06 ENCOUNTER — Ambulatory Visit (INDEPENDENT_AMBULATORY_CARE_PROVIDER_SITE_OTHER): Payer: Medicare Other | Admitting: *Deleted

## 2013-01-06 DIAGNOSIS — Z7901 Long term (current) use of anticoagulants: Secondary | ICD-10-CM

## 2013-01-06 DIAGNOSIS — I4891 Unspecified atrial fibrillation: Secondary | ICD-10-CM | POA: Diagnosis not present

## 2013-01-06 LAB — POCT INR: INR: 3.3

## 2013-01-20 ENCOUNTER — Ambulatory Visit (INDEPENDENT_AMBULATORY_CARE_PROVIDER_SITE_OTHER): Payer: Medicare Other | Admitting: General Practice

## 2013-01-20 DIAGNOSIS — I4891 Unspecified atrial fibrillation: Secondary | ICD-10-CM | POA: Diagnosis not present

## 2013-01-20 DIAGNOSIS — Z7901 Long term (current) use of anticoagulants: Secondary | ICD-10-CM

## 2013-01-24 ENCOUNTER — Encounter (HOSPITAL_COMMUNITY): Payer: Self-pay | Admitting: Emergency Medicine

## 2013-01-24 ENCOUNTER — Emergency Department (HOSPITAL_COMMUNITY)
Admission: EM | Admit: 2013-01-24 | Discharge: 2013-01-24 | Disposition: A | Discharge status: Home / Self Care | Payer: Medicare Other | Attending: Emergency Medicine | Admitting: Emergency Medicine

## 2013-01-24 DIAGNOSIS — T148XXA Other injury of unspecified body region, initial encounter: Secondary | ICD-10-CM

## 2013-01-24 DIAGNOSIS — Z79899 Other long term (current) drug therapy: Secondary | ICD-10-CM | POA: Diagnosis not present

## 2013-01-24 DIAGNOSIS — S5010XA Contusion of unspecified forearm, initial encounter: Secondary | ICD-10-CM | POA: Diagnosis not present

## 2013-01-24 DIAGNOSIS — Z7982 Long term (current) use of aspirin: Secondary | ICD-10-CM | POA: Diagnosis not present

## 2013-01-24 DIAGNOSIS — I10 Essential (primary) hypertension: Secondary | ICD-10-CM | POA: Insufficient documentation

## 2013-01-24 DIAGNOSIS — R791 Abnormal coagulation profile: Secondary | ICD-10-CM | POA: Diagnosis not present

## 2013-01-24 DIAGNOSIS — R748 Abnormal levels of other serum enzymes: Secondary | ICD-10-CM | POA: Insufficient documentation

## 2013-01-24 DIAGNOSIS — G4733 Obstructive sleep apnea (adult) (pediatric): Secondary | ICD-10-CM | POA: Insufficient documentation

## 2013-01-24 DIAGNOSIS — M109 Gout, unspecified: Secondary | ICD-10-CM | POA: Diagnosis not present

## 2013-01-24 DIAGNOSIS — G8929 Other chronic pain: Secondary | ICD-10-CM | POA: Insufficient documentation

## 2013-01-24 DIAGNOSIS — E785 Hyperlipidemia, unspecified: Secondary | ICD-10-CM | POA: Diagnosis not present

## 2013-01-24 DIAGNOSIS — I2510 Atherosclerotic heart disease of native coronary artery without angina pectoris: Secondary | ICD-10-CM | POA: Insufficient documentation

## 2013-01-24 DIAGNOSIS — S40029A Contusion of unspecified upper arm, initial encounter: Secondary | ICD-10-CM | POA: Insufficient documentation

## 2013-01-24 DIAGNOSIS — K219 Gastro-esophageal reflux disease without esophagitis: Secondary | ICD-10-CM | POA: Diagnosis not present

## 2013-01-24 DIAGNOSIS — Z8673 Personal history of transient ischemic attack (TIA), and cerebral infarction without residual deficits: Secondary | ICD-10-CM | POA: Insufficient documentation

## 2013-01-24 DIAGNOSIS — Z7901 Long term (current) use of anticoagulants: Secondary | ICD-10-CM | POA: Insufficient documentation

## 2013-01-24 DIAGNOSIS — Y9389 Activity, other specified: Secondary | ICD-10-CM | POA: Insufficient documentation

## 2013-01-24 DIAGNOSIS — Y9289 Other specified places as the place of occurrence of the external cause: Secondary | ICD-10-CM | POA: Insufficient documentation

## 2013-01-24 DIAGNOSIS — Z951 Presence of aortocoronary bypass graft: Secondary | ICD-10-CM | POA: Insufficient documentation

## 2013-01-24 DIAGNOSIS — X500XXA Overexertion from strenuous movement or load, initial encounter: Secondary | ICD-10-CM | POA: Insufficient documentation

## 2013-01-24 LAB — PROTIME-INR: INR: 1.91 — ABNORMAL HIGH (ref 0.00–1.49)

## 2013-01-24 NOTE — ED Notes (Signed)
Pt c/o large hematoma to right arm with no obvious cause; pt sts takes coumadin and had level checked last week

## 2013-01-24 NOTE — ED Notes (Signed)
Discharge instructions reviewed. Pt verbalized understanding.  

## 2013-01-24 NOTE — ED Provider Notes (Signed)
CSN: [id number]     Arrival date & time 01/24/13  1112 History   First MD Initiated Contact with Patient 01/24/13 1151     Chief Complaint  Patient presents with  . Bleeding/Bruising   (Consider location/radiation/quality/duration/timing/severity/associated sxs/prior Treatment) HPI Comments: The patient is a 67 year old male with a past medical history of CAD and Stroke currently on coumadin presenting to the ED with Right arm bruising and swelling.  The patient reports he was curiling dumbbells in a store 3 days ago and did not have pain or  hear a "pop".   He reports noticing a green and blue bruise to the Right forearm the day after lifting the weights.  He reports a 1/10 constant pain worse with supination of his wrist and extension of his elbow.   He denies other trauma or injury to the area. He denies dyspnea, chest pain, light headednes, palpitations. Last INR check was 4 days ago, reports >3.0. Denies history of DVT/PE. Denies paresthesia, paralysis, decrease in temperature the the extremity.    The history is provided by the patient.    Past Medical History  Diagnosis Date  . GERD (gastroesophageal reflux disease)   . Gout   . Hyperlipidemia   . Hypertension   . CVA (cerebral infarction)     left hemiparesis  . Chronic pain     left sided-Kristeins  . CAD (coronary artery disease)     s/p CABG, s/p DES to LCX  January 2011  . OSA on CPAP   . Sleep apnea   . History of colonoscopy    Past Surgical History  Procedure Laterality Date  . Coronary artery bypass graft      stent  . Esophagogastroduodenoscopy  03-25-2005  . Nasal septum surgery    . Percutaneous placement intravascular stent cervical carotid artery      03-2009; using a drug-eluting platform of the circumflex cornoray artery with a 3.0 x 18 [employer] Promus drug-eluting platform post dilated to 3.75 with a noncompliant balloon.  . Cataract extraction  05/2010    left eye  . Cataract extraction  04/2010     right eye  . Cardioversion  06/20/2011    Procedure: CARDIOVERSION;  Surgeon: Laurey Morale, MD;  Location: [facility] ENDOSCOPY;  Service: Cardiovascular;  Laterality: N/A;  . Tee without cardioversion  06/20/2011    Procedure: TRANSESOPHAGEAL ECHOCARDIOGRAM (TEE);  Surgeon: Laurey Morale, MD;  Location: [facility] ENDOSCOPY;  Service: Cardiovascular;  Laterality: N/A;   Family History  Problem Relation Age of Onset  . Lung cancer Father     deceased  . Stroke Mother     deceased-MINISTROKES   History  Substance Use Topics  . Smoking status: Never Smoker   . Smokeless tobacco: Never Used  . Alcohol Use: No    Review of Systems  Allergies  Review of patient's allergies indicates no known allergies.  Home Medications   Current Outpatient Rx  Name  Route  Sig  Dispense  Refill  . allopurinol (ZYLOPRIM) 100 MG tablet   Oral   Take 200 mg by mouth daily.         Marland Kitchen aspirin 81 MG tablet   Oral   Take 81 mg by mouth daily.          . benazepril (LOTENSIN) 10 MG tablet   Oral   Take 1 tablet (10 mg total) by mouth daily.   90 tablet   1   . diltiazem (DILACOR XR)  180 MG 24 hr capsule   Oral   Take 1 capsule (180 mg total) by mouth daily.   90 capsule   1   . furosemide (LASIX) 40 MG tablet   Oral   Take 40 mg by mouth every other day.          . levothyroxine (SYNTHROID, LEVOTHROID) 112 MCG tablet      TAKE 1 TABLET (112 MCG TOTAL) BY MOUTH DAILY.   90 tablet   1   . levothyroxine (SYNTHROID, LEVOTHROID) 112 MCG tablet   Oral   Take 112 mcg by mouth daily before breakfast.         . metFORMIN (GLUCOPHAGE) 500 MG tablet   Oral   Take 500 mg by mouth daily with breakfast. Take 500 mg by mouth daily with breakfast.         . nitroGLYCERIN (NITROSTAT) 0.4 MG SL tablet   Sublingual   Place 0.4 mg under the tongue every 5 (five) minutes as needed. For chest pain Up to 3 doses         . NONFORMULARY OR COMPOUNDED ITEM   Topical   Apply 1-2 g topically See  admin instructions. Amantadine 8%,Diclofenac 3%, Baclofen 2%, Cyclobenzaprine 2%, Gabapentin 10%, Bupivacaine 2% #240 gm Apply 1-2 gms to affected area 3-4 times daily         . pantoprazole (PROTONIX) 40 MG tablet   Oral   Take 40 mg by mouth daily.         . rosuvastatin (CRESTOR) 40 MG tablet   Oral   Take 40 mg by mouth daily.         Marland Kitchen tiZANidine (ZANAFLEX) 2 MG tablet   Oral   Take 1 tablet (2 mg total) by mouth at bedtime.   90 tablet   1   . traMADol (ULTRAM) 50 MG tablet   Oral   Take 1 tablet (50 mg total) by mouth 2 (two) times daily.   180 tablet   1     90 day supply   . traMADol (ULTRAM-ER) 200 MG 24 hr tablet   Oral   Take 1 tablet (200 mg total) by mouth daily. Takes with morning dose of 50mg  tablet   90 tablet   1     90 day supply   . warfarin (COUMADIN) 2.5 MG tablet   Oral   Take 1.25-2.5 mg by mouth daily. Takes one-half tablet on Saturday. Takes one whole tablet on all remaining days.          BP 126/77  Pulse 58  Temp(Src) 97.5 F (36.4 C) (Oral)  Resp 18  SpO2 94% Physical Exam  Nursing note and vitals reviewed. Constitutional: He is oriented to person, place, and time. Vital signs are normal. He appears well-developed and well-nourished. No distress.  HENT:  Head: Normocephalic and atraumatic.  Eyes: EOM are normal.  Neck: Neck supple.  Cardiovascular: Normal rate and regular rhythm.   No murmur heard. Pulmonary/Chest: Effort normal and breath sounds normal. No respiratory distress. He has no wheezes. He has no rales. He exhibits no tenderness.  Abdominal: Soft. There is no tenderness.  Musculoskeletal:       Right elbow: He exhibits normal range of motion.       Right wrist: He exhibits normal range of motion.  Right upper extremity with a large ecchymosis to the volar surface.  Extending from the distal arm to the volar surface of the right wrist.  Swelling  noted.  Full ROM of the right upper extremity without pain. Radial  pulse 2+, no decrease in temperature. Neuro vascularly intact. Good cap refill.   Neurological: He is alert and oriented to person, place, and time.  Skin: Skin is warm and dry.  Psychiatric: He has a normal mood and affect.    ED Course  Procedures (including critical care time) Labs Review Labs Reviewed  PROTIME-INR - Abnormal; Notable for the following:    Prothrombin Time 21.3 (*)    INR 1.91 (*)    All other components within normal limits   Imaging Review No results found.  EKG Interpretation   None       MDM   1. Hematoma   2. Subtherapeutic international normalized ratio (INR)    Patient with a several day history of a hematoma to R upper extremity.  No signs of compartment syndrome at this time.  Will recheck INR.  INR-1.91  Discussed patient history and condition with Dr. Ethelda Chick.  After his evaluation of the patient he agrees that there is no signs of an ischemic limb and can be discharge home.  Discussed lab results and treatment plan with the patient.  He reports understanding and no other concerns at this time and is requesting to be discharge home.  Patient is stable for discharge at this time.    Clabe Seal, PA-C 01/26/13 1254

## 2013-01-24 NOTE — ED Notes (Signed)
Pt c/o rt arm swelling and bruising that he noticed on Saturday. Pt states he may have bumped into something but is unsure. Pt states he takes coumadin. Pt also states he has pain in the arm when he moves it certain ways. Pt has no other sx. Visible swelling and bruising noted.

## 2013-01-24 NOTE — ED Provider Notes (Signed)
Patient developed bruising at right forearm and right upper arm one day after lifting a dumbbell in a store on 01/20/2013. Bruising began 01/21/2013. His discomfort is minimal. He has no numbness in his hand. Eye exam right upper arm ecchymotic at distal third, volar aspect. Forearm is swollen and ecchymotic and volar aspect. Radial pulse 2+ is full range of motion. No tenderness. No signs of compartment syndrome.  Doug Sou, MD 01/24/13 [phone]

## 2013-01-25 ENCOUNTER — Telehealth: Payer: Self-pay | Admitting: Family

## 2013-01-25 NOTE — Telephone Encounter (Signed)
Scheduled appointment for tomorrow with Malva Cogan, PA-C. No appropriate appointment slots were available with Melissa.

## 2013-01-25 NOTE — Telephone Encounter (Signed)
Please call pt to schedule an ED follow up visit today or tomorrow.

## 2013-01-26 ENCOUNTER — Encounter: Payer: Self-pay | Admitting: Physician Assistant

## 2013-01-26 ENCOUNTER — Other Ambulatory Visit: Payer: Self-pay | Admitting: Internal Medicine

## 2013-01-26 ENCOUNTER — Ambulatory Visit (INDEPENDENT_AMBULATORY_CARE_PROVIDER_SITE_OTHER): Payer: Medicare Other | Admitting: Physician Assistant

## 2013-01-26 VITALS — BP 128/80 | HR 72 | Temp 98.3°F | Ht 75.0 in | Wt 270.2 lb

## 2013-01-26 DIAGNOSIS — S40029A Contusion of unspecified upper arm, initial encounter: Secondary | ICD-10-CM | POA: Insufficient documentation

## 2013-01-26 DIAGNOSIS — S40021S Contusion of right upper arm, sequela: Secondary | ICD-10-CM

## 2013-01-26 DIAGNOSIS — IMO0002 Reserved for concepts with insufficient information to code with codable children: Secondary | ICD-10-CM

## 2013-01-26 NOTE — Progress Notes (Signed)
Pre visit review using our clinic review tool, if applicable. No additional management support is needed unless otherwise documented below in the visit note. 

## 2013-01-26 NOTE — Patient Instructions (Addendum)
Please continue to monitor the bruising.  The bruising will turn a greenish yellow color before finally fading away. Please follow-up with the Coumadin Clinic at your scheduled appointment.  Hematoma A hematoma is a collection of blood. The collection of blood can turn into a hard, painful lump under the skin. Your skin may turn blue or yellow if the hematoma is close to the surface of the skin. Most hematomas get better in a few days to weeks. Some hematomas are serious and need medical care. Hematomas can be very small or very big. HOME CARE  Apply ice to the injured area:  Put ice in a plastic bag.  Place a towel between your skin and the bag.  Leave the ice on for 20 minutes, 2 3 times a day for the first 1 to 2 days.  After the first 2 days, switch to using warm packs on the injured area.  Raise (elevate) the injured area to lessen pain and puffiness (swelling). You may also wrap the area with an elastic bandage. Make sure the bandage is not wrapped too tight.  If you have a painful hematoma on your leg or foot, you may use crutches for a couple days.  Only take medicines as told by your doctor. GET HELP RIGHT AWAY IF:   Your pain gets worse.  Your pain is not controlled with medicine.  You have a fever.  Your puffiness gets worse.  Your skin turns more blue or yellow.  Your skin over the hematoma breaks or starts bleeding.  Your hematoma is in your chest or belly (abdomen) and you are short of breath, feel weak, or have a change in consciousness.  Your hematoma is on your scalp and you have a headache that gets worse or a change in alertness or consciousness. MAKE SURE YOU:   Understand these instructions.  Will watch your condition.  Will get help right away if you are not doing well or get worse. Document Released: 04/10/2004 Document Revised: 11/03/2012 Document Reviewed: 08/11/2012 [facility] Patient Information 2014 [place], [place].

## 2013-01-26 NOTE — Assessment & Plan Note (Signed)
Reassurance to patient that bruising will take several weeks to subside, especially given anticoagulation.  Patient's INR not supratherapeutic as noted by ER physician.  INR 1.9.  Patient followed by Coumadin clinic.  Is to continue 2.5 mg daily and follow-up with Coumadin Clinic.

## 2013-01-26 NOTE — Progress Notes (Signed)
Patient ID: [name], male   DOB: 01-Apr-1945, 66 y.o.   MRN: [id number]  Patient presents to clinic today for ED follow-up of hematoma of volar aspect of right forearm.  Patient states that his swelling has improved, but the area is still mildly tender with movement of his arm.  Notes that the area of ecchymosis has expanded.  Denies that the bruising has gotten darker.  Denies lightheadedness, dizziness.  Vitals signs are good in clinic today.  Patient's INR found to be 1.9 in the ER.  Patient has been taking his coumadin as prescribed. 2.5 mg daily, except for Saturday's when he takes 1.25 mg.     Past Medical History  Diagnosis Date  . GERD (gastroesophageal reflux disease)   . Gout   . Hyperlipidemia   . Hypertension   . CVA (cerebral infarction)     left hemiparesis  . Chronic pain     left sided-Kristeins  . CAD (coronary artery disease)     s/p CABG, s/p DES to LCX  January 2011  . OSA on CPAP   . Sleep apnea   . History of colonoscopy     Current Outpatient Prescriptions on File Prior to Visit  Medication Sig Dispense Refill  . allopurinol (ZYLOPRIM) 100 MG tablet Take 200 mg by mouth daily.      Marland Kitchen aspirin 81 MG tablet Take 81 mg by mouth daily.       . benazepril (LOTENSIN) 10 MG tablet Take 1 tablet (10 mg total) by mouth daily.  90 tablet  1  . diltiazem (DILACOR XR) 180 MG 24 hr capsule Take 1 capsule (180 mg total) by mouth daily.  90 capsule  1  . furosemide (LASIX) 40 MG tablet Take 40 mg by mouth every other day.       . levothyroxine (SYNTHROID, LEVOTHROID) 112 MCG tablet TAKE 1 TABLET (112 MCG TOTAL) BY MOUTH DAILY.  90 tablet  1  . levothyroxine (SYNTHROID, LEVOTHROID) 112 MCG tablet Take 112 mcg by mouth daily before breakfast.      . metFORMIN (GLUCOPHAGE) 500 MG tablet Take 500 mg by mouth daily with breakfast. Take 500 mg by mouth daily with breakfast.      . nitroGLYCERIN (NITROSTAT) 0.4 MG SL tablet Place 0.4 mg under the tongue every 5 (five) minutes  as needed. For chest pain Up to 3 doses      . NONFORMULARY OR COMPOUNDED ITEM Apply 1-2 g topically See admin instructions. Amantadine 8%,Diclofenac 3%, Baclofen 2%, Cyclobenzaprine 2%, Gabapentin 10%, Bupivacaine 2% #240 gm Apply 1-2 gms to affected area 3-4 times daily      . pantoprazole (PROTONIX) 40 MG tablet Take 40 mg by mouth daily.      . rosuvastatin (CRESTOR) 40 MG tablet Take 40 mg by mouth daily.      Marland Kitchen tiZANidine (ZANAFLEX) 2 MG tablet Take 1 tablet (2 mg total) by mouth at bedtime.  90 tablet  1  . traMADol (ULTRAM) 50 MG tablet Take 1 tablet (50 mg total) by mouth 2 (two) times daily.  180 tablet  1  . traMADol (ULTRAM-ER) 200 MG 24 hr tablet Take 1 tablet (200 mg total) by mouth daily. Takes with morning dose of 50mg  tablet  90 tablet  1  . warfarin (COUMADIN) 2.5 MG tablet Take 1.25-2.5 mg by mouth daily. Takes one-half tablet on Saturday. Takes one whole tablet on all remaining days.       No current facility-administered medications on  file prior to visit.    No Known Allergies  Family History  Problem Relation Age of Onset  . Lung cancer Father     deceased  . Stroke Mother     deceased-MINISTROKES    History   Social History  . Marital Status: Married    Spouse Name: [name]    Number of Children: N/A  . Years of Education: N/A   Occupational History  . retired Web designer   Social History Main Topics  . Smoking status: Never Smoker   . Smokeless tobacco: Never Used  . Alcohol Use: No  . Drug Use: No  . Sexual Activity: Yes   Other Topics Concern  . None   Social History Narrative  . None    ROS See HPI.  All other ROS are negative.   Filed Vitals:   01/26/13 0843  BP: 128/80  Pulse: 72  Temp: 98.3 F (36.8 C)    Physical Exam  Vitals reviewed. Constitutional: He is oriented to person, place, and time and well-developed, well-nourished, and in no distress.  HENT:  Head: Normocephalic and atraumatic.  Eyes:  Conjunctivae are normal.  Neck: Neck supple.  Cardiovascular: Normal rate, regular rhythm, normal heart sounds and intact distal pulses.   Pulmonary/Chest: Effort normal and breath sounds normal. No respiratory distress. He has no wheezes. He has no rales. He exhibits no tenderness.  Neurological: He is alert and oriented to person, place, and time.  Skin: Skin is warm and dry.  Presence of a large region of ecchymosis of right volar forearm, extending into upper arm, consistent with ruptured hematoma. No evidence of significant swelling.  Mild tenderness noted with palpation.  Psychiatric: Affect normal.     Recent Results (from the past 2160 hour(s))  BASIC METABOLIC PANEL     Status: Abnormal   Collection Time    11/08/12  9:39 AM      Result Value Range   Sodium 138  135 - 145 mEq/L   Potassium 4.4  3.5 - 5.3 mEq/L   Chloride 104  96 - 112 mEq/L   CO2 28  19 - 32 mEq/L   Glucose, Bld 123 (*) 70 - 99 mg/dL   BUN 19  6 - 23 mg/dL   Creat 4.09  8.11 - 9.14 mg/dL   Calcium 9.5  8.4 - 78.2 mg/dL  POCT INR     Status: None   Collection Time    11/08/12 10:15 AM      Result Value Range   INR 3.2    CBC WITH DIFFERENTIAL     Status: Abnormal   Collection Time    11/16/12  4:54 PM      Result Value Range   WBC 6.6  4.0 - 10.5 K/uL   RBC 5.08  4.22 - 5.81 MIL/uL   Hemoglobin 13.9  13.0 - 17.0 g/dL   HCT 95.6  21.3 - 08.6 %   MCV 82.5  78.0 - 100.0 fL   MCH 27.4  26.0 - 34.0 pg   MCHC 33.2  30.0 - 36.0 g/dL   RDW 57.8  46.9 - 62.9 %   Platelets 189  150 - 400 K/uL   Neutrophils Relative % 53  43 - 77 %   Neutro Abs 3.6  1.7 - 7.7 K/uL   Lymphocytes Relative 28  12 - 46 %   Lymphs Abs 1.8  0.7 - 4.0 K/uL   Monocytes Relative 9  3 - 12 %   Monocytes Absolute 0.6  0.1 - 1.0 K/uL   Eosinophils Relative 8 (*) 0 - 5 %   Eosinophils Absolute 0.5  0.0 - 0.7 K/uL   Basophils Relative 2 (*) 0 - 1 %   Basophils Absolute 0.1  0.0 - 0.1 K/uL   Smear Review Criteria for review not met     TSH     Status: None   Collection Time    11/16/12  4:54 PM      Result Value Range   TSH 1.472  0.350 - 4.500 uIU/mL  POCT INR     Status: None   Collection Time    11/29/12  8:20 AM      Result Value Range   INR 3.2    POCT INR     Status: None   Collection Time    12/23/12  8:47 AM      Result Value Range   INR 3.4    POCT INR     Status: None   Collection Time    01/06/13  8:59 AM      Result Value Range   INR 3.3    POCT INR     Status: None   Collection Time    01/20/13  8:24 AM      Result Value Range   INR 3.0    PROTIME-INR     Status: Abnormal   Collection Time    01/24/13 12:13 PM      Result Value Range   Prothrombin Time 21.3 (*) 11.6 - 15.2 seconds   INR 1.91 (*) 0.00 - 1.49    Assessment/Plan: Hematoma of arm Reassurance to patient that bruising will take several weeks to subside, especially given anticoagulation.  Patient's INR not supratherapeutic as noted by ER physician.  INR 1.9.  Patient followed by Coumadin clinic.  Is to continue 2.5 mg daily and follow-up with Coumadin Clinic.

## 2013-01-27 ENCOUNTER — Telehealth: Payer: Self-pay

## 2013-01-27 NOTE — Telephone Encounter (Signed)
Rx request to pharmacy/SLS  

## 2013-01-27 NOTE — Telephone Encounter (Signed)
Patient was seen in the clinic on 01/26/2013. Patient called back on 11.13.2014 and stated that arm discoloration is not getting any better. Patient was instructed that it would take about 3-4 weeks to get better and was instructed that he should follow up with his PCP if he feels that is not getting any better.

## 2013-02-02 NOTE — ED Provider Notes (Signed)
Medical screening examination/treatment/procedure(s) were conducted as a shared visit with non-physician practitioner(s) and myself.  I personally evaluated the patient during the encounter.  EKG Interpretation   None        Doug Sou, MD 02/02/13 [phone]

## 2013-02-06 ENCOUNTER — Other Ambulatory Visit: Payer: Self-pay | Admitting: Internal Medicine

## 2013-02-07 ENCOUNTER — Other Ambulatory Visit: Payer: Self-pay | Admitting: Family

## 2013-02-07 NOTE — Telephone Encounter (Signed)
Rx request to pharmacy/SLS  

## 2013-02-07 NOTE — Telephone Encounter (Signed)
Medication Detail      Disp Refills Start End     benazepril (LOTENSIN) 10 MG tablet 90 tablet 1 11/08/2012     Sig - Route: Take 1 tablet (10 mg total) by mouth daily. - Oral    E-Prescribing Status: Receipt confirmed by pharmacy (11/08/2012 9:12 AM EDT)

## 2013-02-14 ENCOUNTER — Ambulatory Visit (INDEPENDENT_AMBULATORY_CARE_PROVIDER_SITE_OTHER): Payer: Medicare Other | Admitting: *Deleted

## 2013-02-14 DIAGNOSIS — I4891 Unspecified atrial fibrillation: Secondary | ICD-10-CM | POA: Diagnosis not present

## 2013-02-14 DIAGNOSIS — Z7901 Long term (current) use of anticoagulants: Secondary | ICD-10-CM | POA: Diagnosis not present

## 2013-02-15 ENCOUNTER — Encounter: Payer: Self-pay | Admitting: Family

## 2013-02-15 ENCOUNTER — Ambulatory Visit: Payer: Medicare Other | Admitting: Family

## 2013-02-15 ENCOUNTER — Ambulatory Visit (INDEPENDENT_AMBULATORY_CARE_PROVIDER_SITE_OTHER): Payer: Medicare Other | Admitting: Family

## 2013-02-15 VITALS — BP 136/78 | HR 71 | Temp 97.8°F | Resp 16 | Ht 75.0 in | Wt 269.1 lb

## 2013-02-15 DIAGNOSIS — E039 Hypothyroidism, unspecified: Secondary | ICD-10-CM

## 2013-02-15 DIAGNOSIS — I4891 Unspecified atrial fibrillation: Secondary | ICD-10-CM

## 2013-02-15 DIAGNOSIS — R7309 Other abnormal glucose: Secondary | ICD-10-CM

## 2013-02-15 DIAGNOSIS — S40021S Contusion of right upper arm, sequela: Secondary | ICD-10-CM

## 2013-02-15 DIAGNOSIS — Z23 Encounter for immunization: Secondary | ICD-10-CM

## 2013-02-15 DIAGNOSIS — R739 Hyperglycemia, unspecified: Secondary | ICD-10-CM

## 2013-02-15 DIAGNOSIS — E785 Hyperlipidemia, unspecified: Secondary | ICD-10-CM

## 2013-02-15 DIAGNOSIS — IMO0002 Reserved for concepts with insufficient information to code with codable children: Secondary | ICD-10-CM

## 2013-02-15 DIAGNOSIS — I10 Essential (primary) hypertension: Secondary | ICD-10-CM | POA: Diagnosis not present

## 2013-02-15 HISTORY — DX: Hypothyroidism, unspecified: E03.9

## 2013-02-15 LAB — BASIC METABOLIC PANEL
BUN: 20 mg/dL (ref 6–23)
CO2: 29 mEq/L (ref 19–32)
Chloride: 102 mEq/L (ref 96–112)
Creat: 1.17 mg/dL (ref 0.50–1.35)
Glucose, Bld: 108 mg/dL — ABNORMAL HIGH (ref 70–99)
Potassium: 4.1 mEq/L (ref 3.5–5.3)
Sodium: 141 mEq/L (ref 135–145)

## 2013-02-15 NOTE — Patient Instructions (Signed)
Please complete lab work prior to leaving. Follow up in 3 months.  

## 2013-02-15 NOTE — Progress Notes (Signed)
Subjective:    Patient ID: [name], male    DOB: 13-Nov-1945, 67 y.o.   MRN: [id number]  HPI  Mr. Edelson is a 67 yr old male who presents today for follow up.  1) Hyperlipidemia- maintained on crestor- LDL 43 this past summer.  2) HTN- maintained on benazepril, furosemide, diltiazem.  3) Hypothyroid- maintained on levothyroxine, 112 mcg  4) AF- follows with coumadin clinic.  Rate today is 71.    5) R arm hematoma- resolved.    Review of Systems    see HPI  Past Medical History  Diagnosis Date  . GERD (gastroesophageal reflux disease)   . Gout   . Hyperlipidemia   . Hypertension   . CVA (cerebral infarction)     left hemiparesis  . Chronic pain     left sided-Kristeins  . CAD (coronary artery disease)     s/p CABG, s/p DES to LCX  January 2011  . OSA on CPAP   . Sleep apnea   . History of colonoscopy     History   Social History  . Marital Status: Married    Spouse Name: [name]    Number of Children: N/A  . Years of Education: N/A   Occupational History  . retired Web designer   Social History Main Topics  . Smoking status: Never Smoker   . Smokeless tobacco: Never Used  . Alcohol Use: No  . Drug Use: No  . Sexual Activity: Yes   Other Topics Concern  . Not on file   Social History Narrative  . No narrative on file    Past Surgical History  Procedure Laterality Date  . Coronary artery bypass graft      stent  . Esophagogastroduodenoscopy  03-25-2005  . Nasal septum surgery    . Percutaneous placement intravascular stent cervical carotid artery      03-2009; using a drug-eluting platform of the circumflex cornoray artery with a 3.0 x 18 [employer] Promus drug-eluting platform post dilated to 3.75 with a noncompliant balloon.  . Cataract extraction  05/2010    left eye  . Cataract extraction  04/2010    right eye  . Cardioversion  06/20/2011    Procedure: CARDIOVERSION;  Surgeon: Laurey Morale, MD;  Location: [facility]  ENDOSCOPY;  Service: Cardiovascular;  Laterality: N/A;  . Tee without cardioversion  06/20/2011    Procedure: TRANSESOPHAGEAL ECHOCARDIOGRAM (TEE);  Surgeon: Laurey Morale, MD;  Location: [facility] ENDOSCOPY;  Service: Cardiovascular;  Laterality: N/A;    Family History  Problem Relation Age of Onset  . Lung cancer Father     deceased  . Stroke Mother     deceased-MINISTROKES    No Known Allergies  Current Outpatient Prescriptions on File Prior to Visit  Medication Sig Dispense Refill  . allopurinol (ZYLOPRIM) 100 MG tablet Take 200 mg by mouth daily.      Marland Kitchen aspirin 81 MG tablet Take 81 mg by mouth daily.       . benazepril (LOTENSIN) 10 MG tablet TAKE 1 TABLET (10 MG TOTAL) BY MOUTH DAILY.  30 tablet  3  . diltiazem (DILACOR XR) 180 MG 24 hr capsule Take 1 capsule (180 mg total) by mouth daily.  90 capsule  1  . furosemide (LASIX) 40 MG tablet Take 1 tablet (40 mg total) by mouth every other day.  45 tablet  0  . levothyroxine (SYNTHROID, LEVOTHROID) 112 MCG tablet TAKE 1 TABLET (112 MCG  TOTAL) BY MOUTH DAILY.  90 tablet  1  . metFORMIN (GLUCOPHAGE) 500 MG tablet Take 500 mg by mouth daily with breakfast. Take 500 mg by mouth daily with breakfast.      . nitroGLYCERIN (NITROSTAT) 0.4 MG SL tablet Place 0.4 mg under the tongue every 5 (five) minutes as needed. For chest pain Up to 3 doses      . pantoprazole (PROTONIX) 40 MG tablet TAKE 1 TABLET (40 MG TOTAL) BY MOUTH DAILY.  90 tablet  0  . rosuvastatin (CRESTOR) 40 MG tablet Take 40 mg by mouth daily.      Marland Kitchen tiZANidine (ZANAFLEX) 2 MG tablet Take 1 tablet (2 mg total) by mouth at bedtime.  90 tablet  1  . traMADol (ULTRAM) 50 MG tablet Take 1 tablet (50 mg total) by mouth 2 (two) times daily.  180 tablet  1  . traMADol (ULTRAM-ER) 200 MG 24 hr tablet Take 1 tablet (200 mg total) by mouth daily. Takes with morning dose of 50mg  tablet  90 tablet  1  . warfarin (COUMADIN) 2.5 MG tablet Take 1.25-2.5 mg by mouth daily. Takes one-half tablet on  Saturday. Takes one whole tablet on all remaining days.       No current facility-administered medications on file prior to visit.    BP 136/78  Pulse 71  Temp(Src) 97.8 F (36.6 C) (Oral)  Resp 16  Ht 6\' 3"  (1.905 m)  Wt 269 lb 1.9 oz (122.072 kg)  BMI 33.64 kg/m2  SpO2 97%    Objective:   Physical Exam  Constitutional: He is oriented to person, place, and time. He appears well-developed and well-nourished. No distress.  Cardiovascular: Normal rate.   No murmur heard. Pulmonary/Chest: Effort normal and breath sounds normal. No respiratory distress. He has no wheezes.  Musculoskeletal:  No LE edema is noted  Neurological: He is alert and oriented to person, place, and time.  Skin: Skin is warm and dry.  Resolution of right arm hematoma  Psychiatric: He has a normal mood and affect. His behavior is normal. Judgment and thought content normal.          Assessment & Plan:

## 2013-02-15 NOTE — Progress Notes (Signed)
Pre visit review using our clinic review tool, if applicable. No additional management support is needed unless otherwise documented below in the visit note. 

## 2013-02-15 NOTE — Assessment & Plan Note (Signed)
Stable on crestor, continue same.  

## 2013-02-15 NOTE — Assessment & Plan Note (Signed)
Stable on current dose of synthroid, continue same.  

## 2013-02-15 NOTE — Assessment & Plan Note (Signed)
Check A1C, continue metformin

## 2013-02-15 NOTE — Assessment & Plan Note (Signed)
Resolved

## 2013-02-15 NOTE — Assessment & Plan Note (Signed)
BP Readings from Last 3 Encounters:  02/15/13 136/78  01/26/13 128/80  01/24/13 126/77   BP stable on current meds. Continue same, obtain bmet.

## 2013-02-15 NOTE — Assessment & Plan Note (Signed)
Stable. Defer management to cardiology and coumadin clinic.

## 2013-02-17 ENCOUNTER — Encounter: Payer: Self-pay | Admitting: Family

## 2013-02-20 ENCOUNTER — Other Ambulatory Visit: Payer: Self-pay | Admitting: Cardiology

## 2013-03-11 ENCOUNTER — Encounter: Payer: Self-pay | Admitting: Family Medicine

## 2013-03-11 ENCOUNTER — Ambulatory Visit (INDEPENDENT_AMBULATORY_CARE_PROVIDER_SITE_OTHER): Payer: Medicare Other | Admitting: Family Medicine

## 2013-03-11 VITALS — BP 142/84 | HR 99 | Temp 98.7°F | Ht 75.0 in | Wt 272.1 lb

## 2013-03-11 DIAGNOSIS — L0390 Cellulitis, unspecified: Secondary | ICD-10-CM

## 2013-03-11 DIAGNOSIS — I10 Essential (primary) hypertension: Secondary | ICD-10-CM | POA: Diagnosis not present

## 2013-03-11 DIAGNOSIS — R7309 Other abnormal glucose: Secondary | ICD-10-CM

## 2013-03-11 DIAGNOSIS — L0291 Cutaneous abscess, unspecified: Secondary | ICD-10-CM | POA: Diagnosis not present

## 2013-03-11 MED ORDER — CEPHALEXIN 500 MG PO CAPS: 500.0000 mg | ORAL_CAPSULE | Freq: Four times a day (QID) | ORAL | Status: DC

## 2013-03-11 NOTE — Progress Notes (Signed)
Pre visit review using our clinic review tool, if applicable. No additional management support is needed unless otherwise documented below in the visit note. 

## 2013-03-11 NOTE — Patient Instructions (Signed)
Consider a probiotic such as Digestive Advantage, Phillip's colon health or a generic  Cellulitis Cellulitis is an infection of the skin and the tissue beneath it. The infected area is usually red and tender. Cellulitis occurs most often in the arms and lower legs.  CAUSES  Cellulitis is caused by bacteria that enter the skin through cracks or cuts in the skin. The most common types of bacteria that cause cellulitis are Staphylococcus and Streptococcus. SYMPTOMS   Redness and warmth.  Swelling.  Tenderness or pain.  Fever. DIAGNOSIS  Your caregiver can usually determine what is wrong based on a physical exam. Blood tests may also be done. TREATMENT  Treatment usually involves taking an antibiotic medicine. HOME CARE INSTRUCTIONS   Take your antibiotics as directed. Finish them even if you start to feel better.  Keep the infected arm or leg elevated to reduce swelling.  Apply a warm cloth to the affected area up to 4 times per day to relieve pain.  Only take over-the-counter or prescription medicines for pain, discomfort, or fever as directed by your caregiver.  Keep all follow-up appointments as directed by your caregiver. SEEK MEDICAL CARE IF:   You notice red streaks coming from the infected area.  Your red area gets larger or turns dark in color.  Your bone or joint underneath the infected area becomes painful after the skin has healed.  Your infection returns in the same area or another area.  You notice a swollen bump in the infected area.  You develop new symptoms. SEEK IMMEDIATE MEDICAL CARE IF:   You have a fever.  You feel very sleepy.  You develop vomiting or diarrhea.  You have a general ill feeling (malaise) with muscle aches and pains. MAKE SURE YOU:   Understand these instructions.  Will watch your condition.  Will get help right away if you are not doing well or get worse. Document Released: 12/11/2004 Document Revised: 09/02/2011 Document  Reviewed: 05/19/2011 [facility] Patient Information 2014 [place], [place].

## 2013-03-13 ENCOUNTER — Encounter: Payer: Self-pay | Admitting: Family Medicine

## 2013-03-13 NOTE — Assessment & Plan Note (Signed)
Adequate control given acute infection.

## 2013-03-13 NOTE — Assessment & Plan Note (Signed)
Minimize simple carbs, continue current meds

## 2013-03-13 NOTE — Progress Notes (Signed)
Patient ID: [name], male   DOB: Jun 03, 1945, 67 y.o.   MRN: [id number] Aldrin Engelhard [id number] [birth year] 03/13/2013      Progress Note-Follow Up  Subjective  Chief Complaint  Chief Complaint  Patient presents with  . Cellulitis    X this morning- left leg started turning hot and red    HPI  Is a 67 year old male who is in today complaining of left leg rash with pain and swelling. Symptoms have been worsening over several days. He feels hot and red for the last 2 days it is just been read. Has less malaise and some chills. Anorexia is noted. No nausea vomiting or fevers. No chest pain. No chest pain, palpitations or shortness of breath  Past Medical History  Diagnosis Date  . GERD (gastroesophageal reflux disease)   . Gout   . Hyperlipidemia   . Hypertension   . CVA (cerebral infarction)     left hemiparesis  . Chronic pain     left sided-Kristeins  . CAD (coronary artery disease)     s/p CABG, s/p DES to LCX  January 2011  . OSA on CPAP   . Sleep apnea   . History of colonoscopy     Past Surgical History  Procedure Laterality Date  . Coronary artery bypass graft      stent  . Esophagogastroduodenoscopy  03-25-2005  . Nasal septum surgery    . Percutaneous placement intravascular stent cervical carotid artery      03-2009; using a drug-eluting platform of the circumflex cornoray artery with a 3.0 x 18 [employer] Promus drug-eluting platform post dilated to 3.75 with a noncompliant balloon.  . Cataract extraction  05/2010    left eye  . Cataract extraction  04/2010    right eye  . Cardioversion  06/20/2011    Procedure: CARDIOVERSION;  Surgeon: Laurey Morale, MD;  Location: [facility] ENDOSCOPY;  Service: Cardiovascular;  Laterality: N/A;  . Tee without cardioversion  06/20/2011    Procedure: TRANSESOPHAGEAL ECHOCARDIOGRAM (TEE);  Surgeon: Laurey Morale, MD;  Location: [facility] ENDOSCOPY;  Service: Cardiovascular;  Laterality: N/A;    Family History  Problem  Relation Age of Onset  . Lung cancer Father     deceased  . Stroke Mother     deceased-MINISTROKES    History   Social History  . Marital Status: Married    Spouse Name: [name]    Number of Children: N/A  . Years of Education: N/A   Occupational History  . retired Web designer   Social History Main Topics  . Smoking status: Never Smoker   . Smokeless tobacco: Never Used  . Alcohol Use: No  . Drug Use: No  . Sexual Activity: Yes   Other Topics Concern  . Not on file   Social History Narrative  . No narrative on file    Current Outpatient Prescriptions on File Prior to Visit  Medication Sig Dispense Refill  . allopurinol (ZYLOPRIM) 100 MG tablet Take 200 mg by mouth daily.      Marland Kitchen aspirin 81 MG tablet Take 81 mg by mouth daily.       . benazepril (LOTENSIN) 10 MG tablet TAKE 1 TABLET (10 MG TOTAL) BY MOUTH DAILY.  30 tablet  3  . diltiazem (DILACOR XR) 180 MG 24 hr capsule Take 1 capsule (180 mg total) by mouth daily.  90 capsule  1  . furosemide (LASIX) 40 MG tablet Take 1 tablet (  40 mg total) by mouth every other day.  45 tablet  0  . levothyroxine (SYNTHROID, LEVOTHROID) 112 MCG tablet TAKE 1 TABLET (112 MCG TOTAL) BY MOUTH DAILY.  90 tablet  1  . metFORMIN (GLUCOPHAGE) 500 MG tablet Take 500 mg by mouth daily with breakfast. Take 500 mg by mouth daily with breakfast.      . nitroGLYCERIN (NITROSTAT) 0.4 MG SL tablet Place 0.4 mg under the tongue every 5 (five) minutes as needed. For chest pain Up to 3 doses      . pantoprazole (PROTONIX) 40 MG tablet TAKE 1 TABLET (40 MG TOTAL) BY MOUTH DAILY.  90 tablet  0  . rosuvastatin (CRESTOR) 40 MG tablet Take 40 mg by mouth daily.      Marland Kitchen tiZANidine (ZANAFLEX) 2 MG tablet Take 1 tablet (2 mg total) by mouth at bedtime.  90 tablet  1  . traMADol (ULTRAM) 50 MG tablet Take 1 tablet (50 mg total) by mouth 2 (two) times daily.  180 tablet  1  . traMADol (ULTRAM-ER) 200 MG 24 hr tablet Take 1 tablet (200 mg total) by  mouth daily. Takes with morning dose of 50mg  tablet  90 tablet  1  . warfarin (COUMADIN) 2.5 MG tablet TAKE AS DIRECTED BY ANTICOAGULATION CLINIC  100 tablet  1   No current facility-administered medications on file prior to visit.    No Known Allergies  Review of Systems  Review of Systems  Constitutional: Positive for chills and malaise/fatigue. Negative for fever.  HENT: Negative for congestion.   Eyes: Negative for discharge.  Respiratory: Negative for shortness of breath.   Cardiovascular: Negative for chest pain, palpitations and leg swelling.  Gastrointestinal: Positive for nausea. Negative for abdominal pain and diarrhea.  Genitourinary: Negative for dysuria.  Musculoskeletal: Positive for myalgias. Negative for falls.       Left leg pain  Skin: Positive for rash.       Left leg.   Neurological: Negative for loss of consciousness and headaches.  Endo/Heme/Allergies: Negative for polydipsia.  Psychiatric/Behavioral: Negative for depression and suicidal ideas. The patient is not nervous/anxious and does not have insomnia.     Objective  BP 142/84  Pulse 99  Temp(Src) 98.7 F (37.1 C) (Oral)  Ht 6\' 3"  (1.905 m)  Wt 272 lb 1.9 oz (123.433 kg)  BMI 34.01 kg/m2  SpO2 92%  Physical Exam  Physical Exam  Constitutional: He is oriented to person, place, and time and well-developed, well-nourished, and in no distress. No distress.  HENT:  Head: Normocephalic and atraumatic.  Eyes: Conjunctivae are normal.  Neck: Neck supple. No thyromegaly present.  Cardiovascular: Normal heart sounds.   Irregularly irregular  Pulmonary/Chest: Effort normal and breath sounds normal. No respiratory distress.  Abdominal: He exhibits no distension and no mass. There is no tenderness.  Musculoskeletal: He exhibits no edema.  Neurological: He is alert and oriented to person, place, and time.  Skin: Skin is warm. Rash noted. There is erythema. No pallor.  Erythematous swollen skin on left  shin, no discharge   Psychiatric: Memory, affect and judgment normal.    Lab Results  Component Value Date   TSH 1.472 11/16/2012   Lab Results  Component Value Date   WBC 6.6 11/16/2012   HGB 13.9 11/16/2012   HCT 41.9 11/16/2012   MCV 82.5 11/16/2012   PLT 189 11/16/2012   Lab Results  Component Value Date   CREATININE 1.17 02/15/2013   BUN 20 02/15/2013  NA 141 02/15/2013   K 4.1 02/15/2013   CL 102 02/15/2013   CO2 29 02/15/2013   Lab Results  Component Value Date   ALT 49 08/31/2012   AST 55* 08/31/2012   ALKPHOS 60 08/31/2012   BILITOT 0.7 08/31/2012   Lab Results  Component Value Date   CHOL 115 10/11/2012   Lab Results  Component Value Date   HDL 46 10/11/2012   Lab Results  Component Value Date   LDLCALC 43 10/11/2012   Lab Results  Component Value Date   TRIG 129 10/11/2012   Lab Results  Component Value Date   CHOLHDL 2.5 10/11/2012     Assessment & Plan  HYPERTENSION Adequate control given acute infection.  DIABETES MELLITUS, TYPE II, BORDERLINE Minimize simple carbs, continue current meds  Cellulitis Started on Keflex and probiotics, wash with mild soap and water daily

## 2013-03-13 NOTE — Assessment & Plan Note (Signed)
Started on Keflex and probiotics, wash with mild soap and water daily

## 2013-03-21 ENCOUNTER — Ambulatory Visit: Payer: Medicare Other | Admitting: Physical Medicine & Rehabilitation

## 2013-03-22 DIAGNOSIS — I739 Peripheral vascular disease, unspecified: Secondary | ICD-10-CM | POA: Diagnosis not present

## 2013-03-22 DIAGNOSIS — L84 Corns and callosities: Secondary | ICD-10-CM | POA: Diagnosis not present

## 2013-03-22 DIAGNOSIS — L608 Other nail disorders: Secondary | ICD-10-CM | POA: Diagnosis not present

## 2013-03-23 ENCOUNTER — Telehealth: Payer: Self-pay | Admitting: Family

## 2013-03-24 ENCOUNTER — Ambulatory Visit: Payer: Medicare Other | Admitting: Family Medicine

## 2013-03-29 ENCOUNTER — Ambulatory Visit (INDEPENDENT_AMBULATORY_CARE_PROVIDER_SITE_OTHER): Payer: Medicare Other | Admitting: Pharmacist

## 2013-03-29 DIAGNOSIS — I4891 Unspecified atrial fibrillation: Secondary | ICD-10-CM

## 2013-03-29 DIAGNOSIS — Z7901 Long term (current) use of anticoagulants: Secondary | ICD-10-CM

## 2013-03-29 LAB — POCT INR: INR: 2.5

## 2013-04-08 ENCOUNTER — Ambulatory Visit (HOSPITAL_BASED_OUTPATIENT_CLINIC_OR_DEPARTMENT_OTHER): Payer: Medicare Other | Admitting: Physical Medicine & Rehabilitation

## 2013-04-08 ENCOUNTER — Encounter: Payer: Self-pay | Admitting: Physical Medicine & Rehabilitation

## 2013-04-08 ENCOUNTER — Telehealth: Payer: Self-pay

## 2013-04-08 ENCOUNTER — Encounter: Payer: Medicare Other | Attending: Physical Medicine & Rehabilitation

## 2013-04-08 VITALS — BP 143/72 | HR 79 | Resp 16 | Ht 75.0 in | Wt 272.0 lb

## 2013-04-08 DIAGNOSIS — M62838 Other muscle spasm: Secondary | ICD-10-CM | POA: Diagnosis not present

## 2013-04-08 DIAGNOSIS — G8110 Spastic hemiplegia affecting unspecified side: Secondary | ICD-10-CM | POA: Diagnosis not present

## 2013-04-08 MED ORDER — TRAMADOL HCL 50 MG PO TABS: 50.0000 mg | ORAL_TABLET | Freq: Two times a day (BID) | ORAL | Status: DC

## 2013-04-08 MED ORDER — TIZANIDINE HCL 2 MG PO TABS: 2.0000 mg | ORAL_TABLET | Freq: Every day | ORAL | Status: DC

## 2013-04-08 MED ORDER — TRAMADOL HCL ER 200 MG PO TB24: 200.0000 mg | ORAL_TABLET | Freq: Every day | ORAL | Status: DC

## 2013-04-08 NOTE — Telephone Encounter (Signed)
Called Tramadol in to pharmacy per Dr. Letta Pate.

## 2013-04-08 NOTE — Patient Instructions (Signed)

## 2013-04-08 NOTE — Progress Notes (Signed)
Botox Injection for spasticity using needle EMG guidance and E stim Dilution: 50 Units/ml  Indication: Severe spasticity which interferes with ADL,mobility and/or hygiene and is unresponsive to medication management and other conservative care  Informed consent was obtained after describing risks and benefits of the procedure with the patient. This includes bleeding, bruising, infection, excessive weakness, or medication side effects. A REMS form is on file and signed.  Needle: 26g,2" needle electrode  Number of units per muscle  Pectoralis0  Biceps100  FCR50  FCU0  FDS50  FDP0  Gastrosoleus0  Hamstrings 200   All injections were done after obtaining appropriate EMG activity and E stinm and after negative drawback for blood. The patient tolerated the procedure well. Post procedure instructions were given. A followup appointment was made.

## 2013-04-14 ENCOUNTER — Other Ambulatory Visit: Payer: Self-pay | Admitting: Family

## 2013-04-15 ENCOUNTER — Telehealth: Payer: Self-pay

## 2013-04-15 NOTE — Telephone Encounter (Signed)
Please alternate heat and ice 20 min each 3 times a day for 3 days

## 2013-04-15 NOTE — Telephone Encounter (Signed)
Patient c/o pain and bruising on left left after Botox injection last Friday. Please advise.

## 2013-04-17 ENCOUNTER — Emergency Department (HOSPITAL_COMMUNITY)
Admission: EM | Admit: 2013-04-17 | Discharge: 2013-04-17 | Disposition: A | Discharge status: Home / Self Care | Payer: Medicare Other | Attending: Emergency Medicine | Admitting: Emergency Medicine

## 2013-04-17 ENCOUNTER — Encounter (HOSPITAL_COMMUNITY): Payer: Self-pay | Admitting: Emergency Medicine

## 2013-04-17 DIAGNOSIS — IMO0001 Reserved for inherently not codable concepts without codable children: Secondary | ICD-10-CM | POA: Insufficient documentation

## 2013-04-17 DIAGNOSIS — R269 Unspecified abnormalities of gait and mobility: Secondary | ICD-10-CM | POA: Diagnosis not present

## 2013-04-17 DIAGNOSIS — M79609 Pain in unspecified limb: Secondary | ICD-10-CM | POA: Diagnosis not present

## 2013-04-17 DIAGNOSIS — K219 Gastro-esophageal reflux disease without esophagitis: Secondary | ICD-10-CM | POA: Diagnosis not present

## 2013-04-17 DIAGNOSIS — Z7982 Long term (current) use of aspirin: Secondary | ICD-10-CM | POA: Diagnosis not present

## 2013-04-17 DIAGNOSIS — Z79899 Other long term (current) drug therapy: Secondary | ICD-10-CM | POA: Diagnosis not present

## 2013-04-17 DIAGNOSIS — I69959 Hemiplegia and hemiparesis following unspecified cerebrovascular disease affecting unspecified side: Secondary | ICD-10-CM | POA: Diagnosis not present

## 2013-04-17 DIAGNOSIS — IMO0002 Reserved for concepts with insufficient information to code with codable children: Secondary | ICD-10-CM | POA: Insufficient documentation

## 2013-04-17 DIAGNOSIS — Z7901 Long term (current) use of anticoagulants: Secondary | ICD-10-CM | POA: Insufficient documentation

## 2013-04-17 DIAGNOSIS — M7989 Other specified soft tissue disorders: Secondary | ICD-10-CM | POA: Insufficient documentation

## 2013-04-17 DIAGNOSIS — G4730 Sleep apnea, unspecified: Secondary | ICD-10-CM | POA: Diagnosis not present

## 2013-04-17 DIAGNOSIS — S40029A Contusion of unspecified upper arm, initial encounter: Secondary | ICD-10-CM | POA: Diagnosis not present

## 2013-04-17 DIAGNOSIS — S8010XA Contusion of unspecified lower leg, initial encounter: Secondary | ICD-10-CM | POA: Insufficient documentation

## 2013-04-17 DIAGNOSIS — Z9889 Other specified postprocedural states: Secondary | ICD-10-CM | POA: Insufficient documentation

## 2013-04-17 DIAGNOSIS — I10 Essential (primary) hypertension: Secondary | ICD-10-CM | POA: Diagnosis not present

## 2013-04-17 DIAGNOSIS — R404 Transient alteration of awareness: Secondary | ICD-10-CM | POA: Diagnosis not present

## 2013-04-17 DIAGNOSIS — R5383 Other fatigue: Secondary | ICD-10-CM | POA: Diagnosis not present

## 2013-04-17 DIAGNOSIS — G4733 Obstructive sleep apnea (adult) (pediatric): Secondary | ICD-10-CM | POA: Diagnosis not present

## 2013-04-17 DIAGNOSIS — G8929 Other chronic pain: Secondary | ICD-10-CM | POA: Insufficient documentation

## 2013-04-17 DIAGNOSIS — E785 Hyperlipidemia, unspecified: Secondary | ICD-10-CM | POA: Diagnosis not present

## 2013-04-17 DIAGNOSIS — R5381 Other malaise: Secondary | ICD-10-CM | POA: Diagnosis not present

## 2013-04-17 DIAGNOSIS — I2510 Atherosclerotic heart disease of native coronary artery without angina pectoris: Secondary | ICD-10-CM | POA: Diagnosis not present

## 2013-04-17 DIAGNOSIS — M109 Gout, unspecified: Secondary | ICD-10-CM | POA: Diagnosis not present

## 2013-04-17 DIAGNOSIS — S7010XA Contusion of unspecified thigh, initial encounter: Secondary | ICD-10-CM | POA: Diagnosis not present

## 2013-04-17 LAB — BASIC METABOLIC PANEL
BUN: 18 mg/dL (ref 6–23)
CO2: 26 meq/L (ref 19–32)
Calcium: 9.1 mg/dL (ref 8.4–10.5)
Chloride: 98 mEq/L (ref 96–112)
Creatinine, Ser: 1.03 mg/dL (ref 0.50–1.35)
GFR calc Af Amer: 84 mL/min — ABNORMAL LOW (ref >90)
GFR calc non Af Amer: 72 mL/min — ABNORMAL LOW (ref >90)
Glucose, Bld: 130 mg/dL — ABNORMAL HIGH (ref 70–99)
POTASSIUM: 4 meq/L (ref 3.7–5.3)
SODIUM: 136 meq/L — AB (ref 137–147)

## 2013-04-17 LAB — CBC WITH DIFFERENTIAL/PLATELET
BASOS ABS: 0 10*3/uL (ref 0.0–0.1)
Basophils Relative: 0 % (ref 0–1)
EOS PCT: 2 % (ref 0–5)
Eosinophils Absolute: 0.2 10*3/uL (ref 0.0–0.7)
HCT: 35.8 % — ABNORMAL LOW (ref 39.0–52.0)
Hemoglobin: 11.9 g/dL — ABNORMAL LOW (ref 13.0–17.0)
LYMPHS PCT: 19 % (ref 12–46)
Lymphs Abs: 1.3 10*3/uL (ref 0.7–4.0)
MCH: 28.7 pg (ref 26.0–34.0)
MCHC: 33.2 g/dL (ref 30.0–36.0)
MCV: 86.5 fL (ref 78.0–100.0)
Monocytes Absolute: 0.8 10*3/uL (ref 0.1–1.0)
Monocytes Relative: 12 % (ref 3–12)
NEUTROS ABS: 4.7 10*3/uL (ref 1.7–7.7)
NEUTROS PCT: 67 % (ref 43–77)
PLATELETS: 153 10*3/uL (ref 150–400)
RBC: 4.14 MIL/uL — AB (ref 4.22–5.81)
RDW: 13.9 % (ref 11.5–15.5)
WBC: 7.1 10*3/uL (ref 4.0–10.5)

## 2013-04-17 LAB — PROTIME-INR
INR: 2.09 — AB (ref 0.00–1.49)
PROTHROMBIN TIME: 22.8 s — AB (ref 11.6–15.2)

## 2013-04-17 MED ORDER — OXYCODONE-ACETAMINOPHEN 5-325 MG PO TABS
1.0000 | ORAL_TABLET | Freq: Once | ORAL | Status: AC
  Administered 2013-04-17: 1 via ORAL
  Filled 2013-04-17: qty 1

## 2013-04-17 MED ORDER — OXYCODONE-ACETAMINOPHEN 5-325 MG PO TABS: 1.0000 | ORAL_TABLET | ORAL | Status: DC | PRN

## 2013-04-17 MED ORDER — FENTANYL CITRATE 0.05 MG/ML IJ SOLN
75.0000 ug | Freq: Once | INTRAMUSCULAR | Status: AC
  Administered 2013-04-17: 75 ug via INTRAVENOUS
  Filled 2013-04-17 (×2): qty 2

## 2013-04-17 NOTE — ED Notes (Signed)
Pt was able to tolerate weight on the Left leg with assistance of supportive device.  Pt states he has a walker at home that he can use.

## 2013-04-17 NOTE — Discharge Instructions (Signed)
Take Percocet for severe pain - Please be careful with this medication.  It can cause drowsiness.  Use caution while driving, operating machinery, drinking alcohol, or any other activities that may impair your physical or mental abilities.   Rest, elevate, and apply cold compresses for relief Stop warfarin/coumadin for 3 days & take a 325 mg aspirin daily - resume warfarin and daily 81 mg aspirin after 3 days - follow-up with your doctor for re-check  Return to the emergency department if you develop any changing/worsening condition, increased swelling, loss of sensation, fever, redness, wounds/drainage, chest pain, shortness of breath, or any other concerns (please read additional information regarding your condition below)     Musculoskeletal Pain Musculoskeletal pain is muscle and boney aches and pains. These pains can occur in any part of the body. Your caregiver may treat you without knowing the cause of the pain. They may treat you if blood or urine tests, X-rays, and other tests were normal.  CAUSES There is often not a definite cause or reason for these pains. These pains may be caused by a type of germ (virus). The discomfort may also come from overuse. Overuse includes working out too hard when your body is not fit. Boney aches also come from weather changes. Bone is sensitive to atmospheric pressure changes. HOME CARE INSTRUCTIONS   Ask when your test results will be ready. Make sure you get your test results.  Only take over-the-counter or prescription medicines for pain, discomfort, or fever as directed by your caregiver. If you were given medications for your condition, do not drive, operate machinery or power tools, or sign legal documents for 24 hours. Do not drink alcohol. Do not take sleeping pills or other medications that may interfere with treatment.  Continue all activities unless the activities cause more pain. When the pain lessens, slowly resume normal activities. Gradually  increase the intensity and duration of the activities or exercise.  During periods of severe pain, bed rest may be helpful. Lay or sit in any position that is comfortable.  Putting ice on the injured area.  Put ice in a bag.  Place a towel between your skin and the bag.  Leave the ice on for 15 to 20 minutes, 3 to 4 times a day.  Follow up with your caregiver for continued problems and no reason can be found for the pain. If the pain becomes worse or does not go away, it may be necessary to repeat tests or do additional testing. Your caregiver may need to look further for a possible cause. SEEK IMMEDIATE MEDICAL CARE IF:  You have pain that is getting worse and is not relieved by medications.  You develop chest pain that is associated with shortness or breath, sweating, feeling sick to your stomach (nauseous), or throw up (vomit).  Your pain becomes localized to the abdomen.  You develop any new symptoms that seem different or that concern you. MAKE SURE YOU:   Understand these instructions.  Will watch your condition.  Will get help right away if you are not doing well or get worse. Document Released: 03/03/2005 Document Revised: 05/26/2011 Document Reviewed: 11/05/2012 [facility] Patient Information 2014 [place].

## 2013-04-17 NOTE — ED Provider Notes (Signed)
CSN: [id number]     Arrival date & time 04/17/13  1043 History   First MD Initiated Contact with Patient 04/17/13 1106     Chief Complaint  Patient presents with  . Leg Pain    left  . Arm Pain    left    HPI  [name] is a 68 y.o. male with a PMH of GERD, gout, HLD, HTN, CVA, chronic pain, CAD s/p CABG and DES, and OSA who presents to the ED for evaluation of left leg and arm pain. History was provided by the patient and his wife. Patient has had left leg pain since an injection 9 days ago. Patient receives botox injections his his left arm and leg for paresthesias every 3 months for the past 6 years. He has paresthesias due to a CVA. Patient states that he the last injection "went wrong." He states that he felt fine initially after the injection but 24 hours later he developed increased pain, swelling, and bruising to the left upper thigh. No hx of trauma/injuries. He states the severe pain is making it difficult to ambulate.  He describes a stabbing pain which is worse with movement. He has numbness and tingling at baseline with no acute changes. No acute changes in weakness or loss of sensation. Patient is currently on Coumadin with no missed doses. He denies any fevers, chills, change in appetite/activity, abdominal pain, nausea, vomiting, chest pain, SOB, headache, lightheadedness or dizziness.    Past Medical History  Diagnosis Date  . GERD (gastroesophageal reflux disease)   . Gout   . Hyperlipidemia   . Hypertension   . CVA (cerebral infarction)     left hemiparesis  . Chronic pain     left sided-Kristeins  . CAD (coronary artery disease)     s/p CABG, s/p DES to LCX  January 2011  . OSA on CPAP   . Sleep apnea   . History of colonoscopy    Past Surgical History  Procedure Laterality Date  . Coronary artery bypass graft      stent  . Esophagogastroduodenoscopy  03-25-2005  . Nasal septum surgery    . Percutaneous placement intravascular stent cervical carotid artery       03-2009; using a drug-eluting platform of the circumflex cornoray artery with a 3.0 x 18 [employer] Promus drug-eluting platform post dilated to 3.75 with a noncompliant balloon.  . Cataract extraction  05/2010    left eye  . Cataract extraction  04/2010    right eye  . Cardioversion  06/20/2011    Procedure: CARDIOVERSION;  Surgeon: Larey Dresser, MD;  Location: [name] ENDOSCOPY;  Service: Cardiovascular;  Laterality: N/A;  . Tee without cardioversion  06/20/2011    Procedure: TRANSESOPHAGEAL ECHOCARDIOGRAM (TEE);  Surgeon: Larey Dresser, MD;  Location: [facility] ENDOSCOPY;  Service: Cardiovascular;  Laterality: N/A;   Family History  Problem Relation Age of Onset  . Lung cancer Father     deceased  . Stroke Mother     deceased-MINISTROKES   History  Substance Use Topics  . Smoking status: Never Smoker   . Smokeless tobacco: Never Used  . Alcohol Use: No    Review of Systems  Constitutional: Negative for fever, chills, diaphoresis, activity change, appetite change and fatigue.  Respiratory: Negative for cough and shortness of breath.   Cardiovascular: Positive for leg swelling. Negative for chest pain.  Gastrointestinal: Negative for nausea, vomiting and abdominal pain.  Genitourinary: Negative for dysuria.  Musculoskeletal: Positive  for gait problem and myalgias. Negative for back pain.  Skin: Positive for color change. Negative for wound.  Neurological: Negative for weakness, numbness and headaches.    Allergies  Review of patient's allergies indicates no known allergies.  Home Medications   Current Outpatient Rx  Name  Route  Sig  Dispense  Refill  . allopurinol (ZYLOPRIM) 100 MG tablet   Oral   Take 200 mg by mouth daily.         Marland Kitchen aspirin 81 MG tablet   Oral   Take 81 mg by mouth daily.          . benazepril (LOTENSIN) 10 MG tablet   Oral   Take 10 mg by mouth daily.         Marland Kitchen diltiazem (DILACOR XR) 180 MG 24 hr capsule   Oral   Take 180 mg by mouth  daily.         . furosemide (LASIX) 40 MG tablet   Oral   Take 40 mg by mouth daily.         Marland Kitchen levothyroxine (SYNTHROID, LEVOTHROID) 112 MCG tablet   Oral   Take 112 mcg by mouth daily before breakfast.         . metFORMIN (GLUCOPHAGE) 500 MG tablet   Oral   Take 500 mg by mouth daily with breakfast.          . pantoprazole (PROTONIX) 40 MG tablet   Oral   Take 40 mg by mouth daily.         . rosuvastatin (CRESTOR) 40 MG tablet   Oral   Take 40 mg by mouth daily.         . traMADol (ULTRAM) 50 MG tablet   Oral   Take 50 mg by mouth at bedtime.         . traMADol (ULTRAM-ER) 200 MG 24 hr tablet   Oral   Take 200 mg by mouth at bedtime.         Marland Kitchen warfarin (COUMADIN) 2.5 MG tablet   Oral   Take 2.5 mg by mouth daily. Take 2.5mg  daily except for on Saturday take 1.25mg          . nitroGLYCERIN (NITROSTAT) 0.4 MG SL tablet   Sublingual   Place 0.4 mg under the tongue every 5 (five) minutes as needed for chest pain. Up to 3 doses          Ht 6\' 3"  (1.905 m)  Wt 262 lb (118.842 kg)  BMI 32.75 kg/m2  SpO2 95%  Filed Vitals:   04/17/13 1052 04/17/13 1100 04/17/13 1115 04/17/13 1130  BP:   143/82 131/78  Resp:   16 17  Height:  6\' 3"  (1.905 m)    Weight:  262 lb (118.842 kg)    SpO2: 95%        Physical Exam  Nursing note and vitals reviewed. Constitutional: He is oriented to person, place, and time. He appears well-developed and well-nourished. No distress.  HENT:  Head: Normocephalic and atraumatic.  Right Ear: External ear normal.  Left Ear: External ear normal.  Nose: Nose normal.  Mouth/Throat: Oropharynx is clear and moist.  Eyes: Conjunctivae are normal. Right eye exhibits no discharge. Left eye exhibits no discharge.  Neck: Normal range of motion. Neck supple.  Cardiovascular: Normal rate, regular rhythm and normal heart sounds.  Exam reveals no gallop and no friction rub.   No murmur heard. Posterior tibial pulses present and equal  bilaterally.   Pulmonary/Chest: Effort normal and breath sounds normal. No respiratory distress. He has no wheezes. He has no rales. He exhibits no tenderness.  Abdominal: Bowel sounds are normal. He exhibits no distension and no mass. There is no tenderness. There is no rebound and no guarding.  Musculoskeletal: Normal range of motion. He exhibits edema and tenderness.       Legs: Tenderness to palpation to the left posterior thigh diffusely. Compartments are soft. Large area of ecchymosis. No erythema or wounds. No left hip, lower lumbar spine, left knee, or left calf tenderness. Patient able to bear weight and ambulate with assistance. Strength 5/5 in the LE bilaterally. No tenderness to palpation to the left arm throughout. Small 1 cm hematoma to the left middle anterior humerus. No wounds, erythema, or edema to the left arm.   Neurological: He is alert and oriented to person, place, and time.  Sensation intact.  Skin: Skin is warm and dry. He is not diaphoretic.       ED Course  Procedures (including critical care time) Labs Review Labs Reviewed - No data to display Imaging Review No results found.  EKG Interpretation    Date/Time:  Sunday April 17 2013 10:58:48 EST Ventricular Rate:  86 PR Interval:  164 QRS Duration: 103 QT Interval:  382 QTC Calculation: 457 R Axis:   -36 Text Interpretation:  Sinus rhythm Abnormal R-wave progression, early transition Left ventricular hypertrophy Nonspecific T abnormalities, lateral leads Confirmed by ZAVITZ  MD, JOSHUA ([id number]) on 04/17/2013 12:56:42 PM           Results for orders placed during the hospital encounter of 04/17/13  CBC WITH DIFFERENTIAL      Result Value Range   WBC 7.1  4.0 - 10.5 K/uL   RBC 4.14 (*) 4.22 - 5.81 MIL/uL   Hemoglobin 11.9 (*) 13.0 - 17.0 g/dL   HCT 35.8 (*) 39.0 - 52.0 %   MCV 86.5  78.0 - 100.0 fL   MCH 28.7  26.0 - 34.0 pg   MCHC 33.2  30.0 - 36.0 g/dL   RDW 13.9  11.5 - 15.5 %   Platelets 153   150 - 400 K/uL   Neutrophils Relative % 67  43 - 77 %   Neutro Abs 4.7  1.7 - 7.7 K/uL   Lymphocytes Relative 19  12 - 46 %   Lymphs Abs 1.3  0.7 - 4.0 K/uL   Monocytes Relative 12  3 - 12 %   Monocytes Absolute 0.8  0.1 - 1.0 K/uL   Eosinophils Relative 2  0 - 5 %   Eosinophils Absolute 0.2  0.0 - 0.7 K/uL   Basophils Relative 0  0 - 1 %   Basophils Absolute 0.0  0.0 - 0.1 K/uL  BASIC METABOLIC PANEL      Result Value Range   Sodium 136 (*) 137 - 147 mEq/L   Potassium 4.0  3.7 - 5.3 mEq/L   Chloride 98  96 - 112 mEq/L   CO2 26  19 - 32 mEq/L   Glucose, Bld 130 (*) 70 - 99 mg/dL   BUN 18  6 - 23 mg/dL   Creatinine, Ser 1.03  0.50 - 1.35 mg/dL   Calcium 9.1  8.4 - 10.5 mg/dL   GFR calc non Af Amer 72 (*) >90 mL/min   GFR calc Af Amer 84 (*) >90 mL/min  PROTIME-INR      Result Value Range   Prothrombin Time 22.8 (*)  11.6 - 15.2 seconds   INR 2.09 (*) 0.00 - 1.49    MDM   [name] is a 68 y.o. male with a PMH of GERD, gout, HLD, HTN, CVA, chronic pain, CAD s/p CABG and DES, and OSA who presents to the ED for evaluation of left leg and arm pain.  Rechecks  2:15 PM = Pain returning.  Ordering percocet.   Consults  2:00 PM = Spoke with Dr. Sherren Mocha who states the patient can follow-up in clinic this week and they will continue to monitor the patient's INR.     Etiology of leg pain likely due to a hematoma from botox injection. No hx of trauma. Hematoma likely exacerbated by coumadin which is therapeutic (2.09). No clinical suspicion for compartment syndrome. Patient neurovascularly intact. No clinical signs of infection. No erythema, warmth, and patient afebrile and non-toxic. Able to ambulate with assistance. Patient has a walker at home to use. Patient had improvements in his pain with fentanyl and percocet. PCP contacted. Plan to hold coumadin for 3 days and start 325 aspirin daily x 3 days and then resume coumadin and daily 81 mg aspirin daily. Patient will be seen by PCP  this week for INR recheck. Patient and wife in agreement with discharge and plan. Return precautions, discharge instructions, and follow-up was discussed with the patient before discharge.     Discharge Medication List as of 04/17/2013  2:22 PM    START taking these medications   Details  oxyCODONE-acetaminophen (PERCOCET/ROXICET) 5-325 MG per tablet Take 1 tablet by mouth every 4 (four) hours as needed for severe pain., Starting 04/17/2013, Until Discontinued, Print        Final impressions: 1. Hematoma of leg      Mercy Moore PA-C   This patient was discussed with Dr. Shon Hale, PA-C 04/17/13 2220

## 2013-04-17 NOTE — ED Notes (Signed)
Pt has a moderate amount of bruising from the Left buttock down to the Left posterior knee.  Bruising in dark purple, skin is intact

## 2013-04-17 NOTE — ED Notes (Signed)
Pt presents via [facility] with c/o of left leg, arm and buttock pain x1 week from Friday.  Pt states the pain is a tingling, stabbing pain.  Pt states he received a Botox injection when the pain began 1 week ago.  Pt left buttock down past his knee on the posterior side is bruised (dark purple).  Pt is on Coumadin.

## 2013-04-18 NOTE — ED Provider Notes (Signed)
Medical screening examination/treatment/procedure(s) were conducted as a shared visit with non-physician practitioner(s) or resident and myself. I personally evaluated the patient during the encounter and agree with the findings and plan unless otherwise indicated.  I have personally reviewed any xrays and/ or EKG's with the provider and I agree with interpretation.  Pt with worsening leg pain/ swelling/ ecchymosis since injection for botox for stroke sxs 1 wk ago. Pt has had injections in the past. Pt is on coumadin for stroke and CAD. Exam posterior thigh tenderness, ecchymosis, compartment soft, nv intact distal, 2+ pulses distal, mild swelling. Clinically thigh hematoma from coumadin/ injection. No signs of infection.  Plan for pain meds, CBC, INR and discuss holding coumadin with pcp on call and close fup outpt, discussed continuing full dose asa.  Filed Vitals:   04/17/13 1311 04/17/13 1345 04/17/13 1400 04/17/13 1441  BP: 127/60 122/54 122/106 128/79  Pulse:  86 85   Temp: 98.4 F (36.9 C)   98 F (36.7 C)  TempSrc: Oral   Oral  Resp:  14 19 17   Height:      Weight:      SpO2: 92% 95% 95%     Left thigh hematoma, Coumadin   Mariea Clonts, MD 04/18/13 1954

## 2013-04-18 NOTE — Telephone Encounter (Signed)
I called to speak with [name] since we were unable to speak with him on Friday to ask how he was doing and if he was still having pain.  His bruising continued to grow and he ended up in the ED on Sunday because of it being so painful. He says he wonders if his position may have affected the injections since he was unable to lie on his stomach and remained on his side, (this time). I am not sure this would have been a factor but just the fact that he is on a blood thinner increased the risk of bleeding and bruising when being stuck with a needle of any kind. He understands.  I told him I would forward this message to Dr Letta Pate and for him to call us if he needs further assistance.

## 2013-04-18 NOTE — Telephone Encounter (Signed)
Please schedule pt to see me in 2wks as add on

## 2013-04-26 ENCOUNTER — Encounter: Payer: Medicare Other | Attending: Physical Medicine & Rehabilitation

## 2013-04-26 ENCOUNTER — Encounter: Payer: Self-pay | Admitting: Physical Medicine & Rehabilitation

## 2013-04-26 ENCOUNTER — Ambulatory Visit (HOSPITAL_BASED_OUTPATIENT_CLINIC_OR_DEPARTMENT_OTHER): Payer: Medicare Other | Admitting: Physical Medicine & Rehabilitation

## 2013-04-26 VITALS — BP 118/69 | HR 101 | Resp 14 | Ht 75.0 in | Wt 264.0 lb

## 2013-04-26 DIAGNOSIS — IMO0002 Reserved for concepts with insufficient information to code with codable children: Secondary | ICD-10-CM | POA: Diagnosis not present

## 2013-04-26 DIAGNOSIS — M62838 Other muscle spasm: Secondary | ICD-10-CM | POA: Diagnosis not present

## 2013-04-26 MED ORDER — HYDROCODONE-ACETAMINOPHEN 10-325 MG PO TABS: 0.5000 | ORAL_TABLET | Freq: Every evening | ORAL | Status: DC | PRN

## 2013-04-26 NOTE — Progress Notes (Signed)
Subjective:    Patient ID: [name], male    DOB: 09-03-1945, 68 y.o.   MRN: [id number]  HPI Botox 1/23 Post injection hematoma L hamstring.  Was 10/10 pain now down to 7/10 Worst at night as well as when sitting in chair  Left arm doing well  Pain Inventory Average Pain 8 Pain Right Now 8 My pain is constant and aching  In the last 24 hours, has pain interfered with the following? General activity 6 Relation with others 6 Enjoyment of life 6 What TIME of day is your pain at its worst? night Sleep (in general) Fair  Pain is worse with: sitting Pain improves with: pacing activities Relief from Meds: 7  Mobility walk without assistance how many minutes can you walk? 20 ability to climb steps?  yes do you drive?  yes Do you have any goals in this area?  no  Function retired  Neuro/Psych trouble walking  Prior Studies Any changes since last visit?  no  Physicians involved in your care Any changes since last visit?  no   Family History  Problem Relation Age of Onset  . Lung cancer Father     deceased  . Stroke Mother     deceased-MINISTROKES   History   Social History  . Marital Status: Married    Spouse Name: [name]    Number of Children: N/A  . Years of Education: N/A   Occupational History  . retired Hydrographic surveyor   Social History Main Topics  . Smoking status: Never Smoker   . Smokeless tobacco: Never Used  . Alcohol Use: No  . Drug Use: No  . Sexual Activity: Yes   Other Topics Concern  . None   Social History Narrative  . None   Past Surgical History  Procedure Laterality Date  . Coronary artery bypass graft      stent  . Esophagogastroduodenoscopy  03-25-2005  . Nasal septum surgery    . Percutaneous placement intravascular stent cervical carotid artery      03-2009; using a drug-eluting platform of the circumflex cornoray artery with a 3.0 x 18 [employer] Promus drug-eluting platform post dilated to  3.75 with a noncompliant balloon.  . Cataract extraction  05/2010    left eye  . Cataract extraction  04/2010    right eye  . Cardioversion  06/20/2011    Procedure: CARDIOVERSION;  Surgeon: Larey Dresser, MD;  Location: [facility] ENDOSCOPY;  Service: Cardiovascular;  Laterality: N/A;  . Tee without cardioversion  06/20/2011    Procedure: TRANSESOPHAGEAL ECHOCARDIOGRAM (TEE);  Surgeon: Larey Dresser, MD;  Location[facility] ENDOSCOPY;  Service: Cardiovascular;  Laterality: N/A;   Past Medical History  Diagnosis Date  . GERD (gastroesophageal reflux disease)   . Gout   . Hyperlipidemia   . Hypertension   . CVA (cerebral infarction)     left hemiparesis  . Chronic pain     left sided-Kristeins  . CAD (coronary artery disease)     s/p CABG, s/p DES to LCX  January 2011  . OSA on CPAP   . Sleep apnea   . History of colonoscopy    BP 118/69  Pulse 101  Resp 14  Ht 6\' 3"  (1.905 m)  Wt 264 lb (119.75 kg)  BMI 33.00 kg/m2  SpO2 97%  Opioid Risk Score:   Fall Risk Score: Moderate Fall Risk (6-13 points) (patient educated, handout declined)   Review of Systems  Musculoskeletal: Positive for arthralgias, gait problem and myalgias.  All other systems reviewed and are negative.       Objective:   Physical Exam Ecchymosis Left upper lateral >medial thigh ,  Left Lateral leg. Left lat leg yellowish discoloration No swelling in leg No tenderness in leg        Assessment & Plan:  1.  Left spastic hemiparesis, Cardioembolic CVA, post injection intramuscular hematoma related to warfarin. Slowly improving. Still has nighttime pain that disturbed sleep. Hydrocodone 5-10 mg each bedtime when necessary for the next month Recheck in approximately 2 months and discuss possible reinjection. Options to reduce hematoma formation include holding Coumadin for 5-7 days prior to injection, producing needle to 27-gauge 1 inch needle although this may reduce efficacy of injection, use ultrasound guidance  to avoid blood vessels

## 2013-04-26 NOTE — Patient Instructions (Addendum)
Pain should improve over time We will see you in 2 months Prescribed hydrocodone to be used as needed for the next month

## 2013-05-02 ENCOUNTER — Encounter: Payer: Self-pay | Admitting: Cardiology

## 2013-05-05 ENCOUNTER — Other Ambulatory Visit: Payer: Self-pay | Admitting: Family

## 2013-05-05 ENCOUNTER — Telehealth: Payer: Self-pay | Admitting: Family

## 2013-05-05 NOTE — Telephone Encounter (Signed)
Refill crestor

## 2013-05-06 MED ORDER — ROSUVASTATIN CALCIUM 40 MG PO TABS: 40.0000 mg | ORAL_TABLET | Freq: Every day | ORAL | Status: DC

## 2013-05-06 NOTE — Telephone Encounter (Signed)
Rx request to pharmacy/SLS  

## 2013-05-13 ENCOUNTER — Ambulatory Visit: Payer: Medicare Other | Admitting: Family

## 2013-05-18 ENCOUNTER — Ambulatory Visit: Payer: Medicare Other | Admitting: Family

## 2013-05-20 ENCOUNTER — Encounter: Payer: Self-pay | Admitting: Family

## 2013-05-20 ENCOUNTER — Ambulatory Visit (INDEPENDENT_AMBULATORY_CARE_PROVIDER_SITE_OTHER): Payer: Medicare Other | Admitting: Family

## 2013-05-20 ENCOUNTER — Other Ambulatory Visit: Payer: Self-pay | Admitting: Cardiology

## 2013-05-20 VITALS — BP 140/90 | HR 81 | Temp 98.3°F | Resp 18 | Ht 75.0 in | Wt 266.0 lb

## 2013-05-20 DIAGNOSIS — I10 Essential (primary) hypertension: Secondary | ICD-10-CM

## 2013-05-20 DIAGNOSIS — E785 Hyperlipidemia, unspecified: Secondary | ICD-10-CM

## 2013-05-20 DIAGNOSIS — G4700 Insomnia, unspecified: Secondary | ICD-10-CM

## 2013-05-20 DIAGNOSIS — E119 Type 2 diabetes mellitus without complications: Secondary | ICD-10-CM

## 2013-05-20 DIAGNOSIS — E039 Hypothyroidism, unspecified: Secondary | ICD-10-CM

## 2013-05-20 DIAGNOSIS — R7309 Other abnormal glucose: Secondary | ICD-10-CM | POA: Diagnosis not present

## 2013-05-20 DIAGNOSIS — J309 Allergic rhinitis, unspecified: Secondary | ICD-10-CM

## 2013-05-20 LAB — BASIC METABOLIC PANEL
BUN: 14 mg/dL (ref 6–23)
CHLORIDE: 99 meq/L (ref 96–112)
CO2: 30 mEq/L (ref 19–32)
Calcium: 9.7 mg/dL (ref 8.4–10.5)
Creat: 1.12 mg/dL (ref 0.50–1.35)
Glucose, Bld: 128 mg/dL — ABNORMAL HIGH (ref 70–99)
POTASSIUM: 4.4 meq/L (ref 3.5–5.3)
SODIUM: 139 meq/L (ref 135–145)

## 2013-05-20 LAB — HEPATIC FUNCTION PANEL
ALT: 44 U/L (ref 0–53)
AST: 36 U/L (ref 0–37)
Albumin: 4.5 g/dL (ref 3.5–5.2)
Alkaline Phosphatase: 51 U/L (ref 39–117)
BILIRUBIN DIRECT: 0.1 mg/dL (ref 0.0–0.3)
Indirect Bilirubin: 0.4 mg/dL (ref 0.2–1.2)
Total Bilirubin: 0.5 mg/dL (ref 0.2–1.2)
Total Protein: 7.5 g/dL (ref 6.0–8.3)

## 2013-05-20 LAB — HEMOGLOBIN A1C
Hgb A1c MFr Bld: 6.1 % — ABNORMAL HIGH (ref <5.7)
MEAN PLASMA GLUCOSE: 128 mg/dL — AB (ref <117)

## 2013-05-20 NOTE — Patient Instructions (Signed)
Please complete lab work prior to leaving. Follow up in 3 months, sooner if problems/concerns.  

## 2013-05-20 NOTE — Progress Notes (Signed)
Subjective:    Patient ID: [name], male    DOB: 03/04/1946, 68 y.o.   MRN: [id number]  HPI  [name] is a 68 yr old male who presents today for follow up of multiple medical problems.  1) HTN- on benazapril, diltiazem BP Readings from Last 3 Encounters:  05/20/13 140/90  04/26/13 118/69  04/17/13 128/79   2) Hypothyroid- on synthroid  3) DM2- continues metformin but not taking regularly because he sometimes forgets.   Lab Results  Component Value Date   HGBA1C 6.7* 02/15/2013   4) Insomnia- not getting along with his daughter who lives with him.  Her husband recently died. Daughter suffers from anorexia and refuses to go to the hospital. Facing foreclosure on his home.    5) Nasal drainage- using mucinex.  Will start flonase.    Review of Systems    see HPI  Past Medical History  Diagnosis Date  . GERD (gastroesophageal reflux disease)   . Gout   . Hyperlipidemia   . Hypertension   . CVA (cerebral infarction)     left hemiparesis  . Chronic pain     left sided-Kristeins  . CAD (coronary artery disease)     s/p CABG, s/p DES to LCX  January 2011  . OSA on CPAP   . Sleep apnea   . History of colonoscopy     History   Social History  . Marital Status: Married    Spouse Name: [name]    Number of Children: N/A  . Years of Education: N/A   Occupational History  . retired Hydrographic surveyor   Social History Main Topics  . Smoking status: Never Smoker   . Smokeless tobacco: Never Used  . Alcohol Use: No  . Drug Use: No  . Sexual Activity: Yes   Other Topics Concern  . Not on file   Social History Narrative  . No narrative on file    Past Surgical History  Procedure Laterality Date  . Coronary artery bypass graft      stent  . Esophagogastroduodenoscopy  03-25-2005  . Nasal septum surgery    . Percutaneous placement intravascular stent cervical carotid artery      03-2009; using a drug-eluting platform of the circumflex cornoray  artery with a 3.0 x 18 [employer] Promus drug-eluting platform post dilated to 3.75 with a noncompliant balloon.  . Cataract extraction  05/2010    left eye  . Cataract extraction  04/2010    right eye  . Cardioversion  06/20/2011    Procedure: CARDIOVERSION;  Surgeon: Larey Dresser, MD;  Location: [facility] ENDOSCOPY;  Service: Cardiovascular;  Laterality: N/A;  . Tee without cardioversion  06/20/2011    Procedure: TRANSESOPHAGEAL ECHOCARDIOGRAM (TEE);  Surgeon: Larey Dresser, MD;  Location: [facility] ENDOSCOPY;  Service: Cardiovascular;  Laterality: N/A;    Family History  Problem Relation Age of Onset  . Lung cancer Father     deceased  . Stroke Mother     deceased-MINISTROKES    No Known Allergies  Current Outpatient Prescriptions on File Prior to Visit  Medication Sig Dispense Refill  . allopurinol (ZYLOPRIM) 100 MG tablet Take 200 mg by mouth daily.      Marland Kitchen aspirin 81 MG tablet Take 81 mg by mouth daily.       . benazepril (LOTENSIN) 10 MG tablet Take 10 mg by mouth daily.      Marland Kitchen diltiazem (DILACOR XR) 180 MG  24 hr capsule Take 180 mg by mouth daily.      . furosemide (LASIX) 40 MG tablet Take 40 mg by mouth daily.      Marland Kitchen HYDROcodone-acetaminophen (NORCO) 10-325 MG per tablet Take 0.5-1 tablets by mouth at bedtime as needed.  30 tablet  0  . levothyroxine (SYNTHROID, LEVOTHROID) 112 MCG tablet Take 112 mcg by mouth daily before breakfast.      . metFORMIN (GLUCOPHAGE) 500 MG tablet Take 500 mg by mouth daily with breakfast.       . nitroGLYCERIN (NITROSTAT) 0.4 MG SL tablet Place 0.4 mg under the tongue every 5 (five) minutes as needed for chest pain. Up to 3 doses      . pantoprazole (PROTONIX) 40 MG tablet TAKE 1 TABLET (40 MG TOTAL) BY MOUTH DAILY.  90 tablet  1  . rosuvastatin (CRESTOR) 40 MG tablet Take 1 tablet (40 mg total) by mouth daily.  30 tablet  0  . traMADol (ULTRAM) 50 MG tablet Take 50 mg by mouth at bedtime.      . traMADol (ULTRAM-ER) 200 MG 24 hr tablet Take 200 mg  by mouth at bedtime.      Marland Kitchen warfarin (COUMADIN) 2.5 MG tablet Take 2.5 mg by mouth daily. Take 2.5mg  daily except for on Saturday take 1.25mg        No current facility-administered medications on file prior to visit.    BP 140/90  Pulse 81  Temp(Src) 98.3 F (36.8 C) (Oral)  Resp 18  Ht 6\' 3"  (1.905 m)  Wt 266 lb (120.657 kg)  BMI 33.25 kg/m2  SpO2 97%    Objective:   Physical Exam  Constitutional: He is oriented to person, place, and time. He appears well-developed and well-nourished. No distress.  HENT:  Head: Normocephalic and atraumatic.  Cardiovascular: Normal rate and regular rhythm.   No murmur heard. Pulmonary/Chest: Effort normal and breath sounds normal. No respiratory distress. He has no wheezes. He has no rales. He exhibits no tenderness.  Musculoskeletal: He exhibits no edema.  Neurological: He is alert and oriented to person, place, and time.  Psychiatric: He has a normal mood and affect. His behavior is normal. Judgment and thought content normal.          Assessment & Plan:

## 2013-05-20 NOTE — Progress Notes (Signed)
Pre visit review using our clinic review tool, if applicable. No additional management support is needed unless otherwise documented below in the visit note. 

## 2013-05-21 ENCOUNTER — Encounter: Payer: Self-pay | Admitting: Family

## 2013-05-21 DIAGNOSIS — G4700 Insomnia, unspecified: Secondary | ICD-10-CM | POA: Insufficient documentation

## 2013-05-21 HISTORY — DX: Insomnia, unspecified: G47.00

## 2013-05-21 LAB — TSH: TSH: 2.352 u[IU]/mL (ref 0.350–4.500)

## 2013-05-21 MED ORDER — FLUTICASONE PROPIONATE 50 MCG/ACT NA SUSP: 2.0000 | Freq: Every day | NASAL | Status: DC

## 2013-05-21 NOTE — Assessment & Plan Note (Signed)
BP up slightly today. Previous readings have been much better. Continue current meds. If still elevated next visit, consider addition of HCTZ.

## 2013-05-21 NOTE — Assessment & Plan Note (Signed)
Likely related to recent stress.  If symptoms worsen, or if symptoms do not improve, consider addition of low dose SSRI.

## 2013-05-21 NOTE — Assessment & Plan Note (Signed)
Deteriorated. Start flonase.

## 2013-05-21 NOTE — Assessment & Plan Note (Signed)
Lab Results  Component Value Date   TSH 2.352 05/20/2013   Follow up TSH stable. Continue current dose of synthroid.

## 2013-05-21 NOTE — Assessment & Plan Note (Signed)
Lab Results  Component Value Date   HGBA1C 6.1* 05/20/2013   A1C is improved from last visit. Continue metformin.

## 2013-05-24 ENCOUNTER — Telehealth: Payer: Self-pay

## 2013-05-24 NOTE — Telephone Encounter (Signed)
Relevant patient education mailed to patient.  

## 2013-05-30 ENCOUNTER — Encounter: Payer: Self-pay | Admitting: *Deleted

## 2013-05-30 ENCOUNTER — Ambulatory Visit (INDEPENDENT_AMBULATORY_CARE_PROVIDER_SITE_OTHER): Payer: Medicare Other | Admitting: *Deleted

## 2013-05-30 DIAGNOSIS — I4891 Unspecified atrial fibrillation: Secondary | ICD-10-CM

## 2013-05-30 DIAGNOSIS — Z7901 Long term (current) use of anticoagulants: Secondary | ICD-10-CM

## 2013-05-30 LAB — POCT INR: INR: 2.3

## 2013-05-31 DIAGNOSIS — L84 Corns and callosities: Secondary | ICD-10-CM | POA: Diagnosis not present

## 2013-05-31 DIAGNOSIS — L608 Other nail disorders: Secondary | ICD-10-CM | POA: Diagnosis not present

## 2013-05-31 DIAGNOSIS — IMO0002 Reserved for concepts with insufficient information to code with codable children: Secondary | ICD-10-CM | POA: Diagnosis not present

## 2013-05-31 DIAGNOSIS — I739 Peripheral vascular disease, unspecified: Secondary | ICD-10-CM | POA: Diagnosis not present

## 2013-06-01 ENCOUNTER — Other Ambulatory Visit: Payer: Self-pay | Admitting: Cardiology

## 2013-06-02 ENCOUNTER — Other Ambulatory Visit: Payer: Self-pay | Admitting: Family

## 2013-06-10 ENCOUNTER — Other Ambulatory Visit: Payer: Self-pay | Admitting: Family

## 2013-06-15 ENCOUNTER — Other Ambulatory Visit: Payer: Self-pay | Admitting: Family

## 2013-06-16 NOTE — Telephone Encounter (Signed)
CRESTOR 40 MG tablet 30 tablet 3 06/10/2013     Sig: TAKE 1 TABLET (40 MG TOTAL) BY MOUTH DAILY.    E-Prescribing Status: Receipt confirmed by pharmacy (06/10/2013 5:07 PM EDT)       Pharmacy    [id number] - [place], [place].

## 2013-06-27 ENCOUNTER — Emergency Department (HOSPITAL_COMMUNITY)
Admission: EM | Admit: 2013-06-27 | Discharge: 2013-06-27 | Disposition: A | Discharge status: Home / Self Care | Payer: Medicare Other | Attending: Emergency Medicine | Admitting: Emergency Medicine

## 2013-06-27 ENCOUNTER — Encounter (HOSPITAL_COMMUNITY): Payer: Self-pay | Admitting: Emergency Medicine

## 2013-06-27 DIAGNOSIS — I10 Essential (primary) hypertension: Secondary | ICD-10-CM | POA: Diagnosis not present

## 2013-06-27 DIAGNOSIS — Z9889 Other specified postprocedural states: Secondary | ICD-10-CM | POA: Diagnosis not present

## 2013-06-27 DIAGNOSIS — Z23 Encounter for immunization: Secondary | ICD-10-CM | POA: Diagnosis not present

## 2013-06-27 DIAGNOSIS — G8929 Other chronic pain: Secondary | ICD-10-CM | POA: Diagnosis not present

## 2013-06-27 DIAGNOSIS — Z79899 Other long term (current) drug therapy: Secondary | ICD-10-CM | POA: Insufficient documentation

## 2013-06-27 DIAGNOSIS — S01311A Laceration without foreign body of right ear, initial encounter: Secondary | ICD-10-CM

## 2013-06-27 DIAGNOSIS — Z7982 Long term (current) use of aspirin: Secondary | ICD-10-CM | POA: Insufficient documentation

## 2013-06-27 DIAGNOSIS — M109 Gout, unspecified: Secondary | ICD-10-CM | POA: Insufficient documentation

## 2013-06-27 DIAGNOSIS — Y939 Activity, unspecified: Secondary | ICD-10-CM | POA: Diagnosis not present

## 2013-06-27 DIAGNOSIS — K219 Gastro-esophageal reflux disease without esophagitis: Secondary | ICD-10-CM | POA: Insufficient documentation

## 2013-06-27 DIAGNOSIS — Z7901 Long term (current) use of anticoagulants: Secondary | ICD-10-CM | POA: Diagnosis not present

## 2013-06-27 DIAGNOSIS — G4733 Obstructive sleep apnea (adult) (pediatric): Secondary | ICD-10-CM | POA: Insufficient documentation

## 2013-06-27 DIAGNOSIS — Y929 Unspecified place or not applicable: Secondary | ICD-10-CM | POA: Diagnosis not present

## 2013-06-27 DIAGNOSIS — I2510 Atherosclerotic heart disease of native coronary artery without angina pectoris: Secondary | ICD-10-CM | POA: Diagnosis not present

## 2013-06-27 DIAGNOSIS — Z8673 Personal history of transient ischemic attack (TIA), and cerebral infarction without residual deficits: Secondary | ICD-10-CM | POA: Diagnosis not present

## 2013-06-27 DIAGNOSIS — G4730 Sleep apnea, unspecified: Secondary | ICD-10-CM | POA: Insufficient documentation

## 2013-06-27 DIAGNOSIS — S01309A Unspecified open wound of unspecified ear, initial encounter: Secondary | ICD-10-CM | POA: Insufficient documentation

## 2013-06-27 DIAGNOSIS — E785 Hyperlipidemia, unspecified: Secondary | ICD-10-CM | POA: Insufficient documentation

## 2013-06-27 DIAGNOSIS — IMO0002 Reserved for concepts with insufficient information to code with codable children: Secondary | ICD-10-CM | POA: Insufficient documentation

## 2013-06-27 LAB — PROTIME-INR
INR: 2.78 — ABNORMAL HIGH (ref 0.00–1.49)
PROTHROMBIN TIME: 28.4 s — AB (ref 11.6–15.2)

## 2013-06-27 MED ORDER — "THROMBI-PAD 3""X3"" EX PADS"
1.0000 | MEDICATED_PAD | Freq: Once | CUTANEOUS | Status: DC
  Filled 2013-06-27: qty 1

## 2013-06-27 MED ORDER — TETANUS-DIPHTH-ACELL PERTUSSIS 5-2.5-18.5 LF-MCG/0.5 IM SUSP
0.5000 mL | Freq: Once | INTRAMUSCULAR | Status: AC
  Administered 2013-06-27: 0.5 mL via INTRAMUSCULAR
  Filled 2013-06-27: qty 0.5

## 2013-06-27 NOTE — ED Notes (Signed)
The patient advised me that he was working on a trailer with tools and the ramp on the trailer fell and "clipped" his ear.  The patient said he is on coumadin because he has has stents in his heart.  The patient said it happened at 1130hrs this morning but it has been wanting to stop bleeding so he decided to wait.  The patient said the bleeding was stopping completely so he decided to come and be evaluated in the ED.

## 2013-06-27 NOTE — Discharge Instructions (Signed)
If your ear begins to bleed again, hold firm pressure for 10-15 minutes.   Follow up with Dr. Migdalia Dk.  Return to the ER or see Dr. Migdalia Dk if your ear bleeds uncontrollably or you develop associated fever, increased pain/swelling or drainage of pus.  Your sutures will need to be removed in 7-10d.

## 2013-06-27 NOTE — ED Notes (Signed)
Pt injuring right ear while working this am. States piece of equipment on back of trailer sliced his right ear around 11am today. Pt has a laceration approximately 1 inch in length with controlled bleeding. Pt is on coumadin.

## 2013-06-27 NOTE — ED Provider Notes (Signed)
CSN: [id number]     Arrival date & time 06/27/13  1758 History  This chart was scribed for non-physician practitioner, Peggye Ley, PA-C working with Ezequiel Essex, MD by Einar Pheasant, ED scribe. This patient was seen in room TR11C/TR11C and the patient's care was started at 8:30 PM.    Chief Complaint  Patient presents with  . Ear Laceration    patient is on coumadin for a heart bypass   The history is provided by the patient. No language interpreter was used.   HPI Comments: [name] is a 68 y.o. male who presents to the Emergency Department complaining of a laceration to right ear that occurred at 11:30 AM. Pt states that he was working on a trailer when the ramp on the trailer fell and "clipped" his right ear. He states that he is on Coumadin due to past surgical procedure of coronary artery bypass graft. Pt states that he decided to wait to see if the bleeding would stop on its own but it has not. Bleeding is still not controlled. Pt states that he has been applying pressure to the ear. Denies any lightheadedness, fatigue, SOB.   Pt was given a tetanus vaccine today.   Past Medical History  Diagnosis Date  . GERD (gastroesophageal reflux disease)   . Gout   . Hyperlipidemia   . Hypertension   . CVA (cerebral infarction)     left hemiparesis  . Chronic pain     left sided-Kristeins  . CAD (coronary artery disease)     s/p CABG, s/p DES to LCX  January 2011  . OSA on CPAP   . Sleep apnea   . History of colonoscopy    Past Surgical History  Procedure Laterality Date  . Coronary artery bypass graft      stent  . Esophagogastroduodenoscopy  03-25-2005  . Nasal septum surgery    . Percutaneous placement intravascular stent cervical carotid artery      03-2009; using a drug-eluting platform of the circumflex cornoray artery with a 3.0 x 18 [employer] Promus drug-eluting platform post dilated to 3.75 with a noncompliant balloon.  . Cataract extraction  05/2010     left eye  . Cataract extraction  04/2010    right eye  . Cardioversion  06/20/2011    Procedure: CARDIOVERSION;  Surgeon: Larey Dresser, MD;  Location: [facility] ENDOSCOPY;  Service: Cardiovascular;  Laterality: N/A;  . Tee without cardioversion  06/20/2011    Procedure: TRANSESOPHAGEAL ECHOCARDIOGRAM (TEE);  Surgeon: Larey Dresser, MD;  Location: [facility] ENDOSCOPY;  Service: Cardiovascular;  Laterality: N/A;   Family History  Problem Relation Age of Onset  . Lung cancer Father     deceased  . Stroke Mother     deceased-MINISTROKES   History  Substance Use Topics  . Smoking status: Never Smoker   . Smokeless tobacco: Never Used  . Alcohol Use: No    Review of Systems  Skin: Positive for wound.  All other systems reviewed and are negative.     Allergies  Review of patient's allergies indicates no known allergies.  Home Medications   Current Outpatient Rx  Name  Route  Sig  Dispense  Refill  . allopurinol (ZYLOPRIM) 100 MG tablet   Oral   Take 200 mg by mouth daily.         Marland Kitchen aspirin 81 MG tablet   Oral   Take 81 mg by mouth daily.          Marland Kitchen  benazepril (LOTENSIN) 10 MG tablet   Oral   Take 10 mg by mouth daily.         . CRESTOR 40 MG tablet      TAKE 1 TABLET BY MOUTH EVERY DAY   90 tablet   0     >>>ATTENTION....PATIENT REQUESTING #90   . diltiazem (CARDIZEM CD) 180 MG 24 hr capsule      TAKE 1 CAPSULE (180 MG TOTAL) BY MOUTH DAILY.   90 capsule   1   . diltiazem (DILACOR XR) 180 MG 24 hr capsule   Oral   Take 180 mg by mouth daily.         . fluticasone (FLONASE) 50 MCG/ACT nasal spray   Each Nare   Place 2 sprays into both nostrils daily.   16 g   2   . furosemide (LASIX) 40 MG tablet   Oral   Take 40 mg by mouth daily.         Marland Kitchen HYDROcodone-acetaminophen (NORCO) 10-325 MG per tablet   Oral   Take 0.5-1 tablets by mouth at bedtime as needed.   30 tablet   0   . levothyroxine (SYNTHROID, LEVOTHROID) 112 MCG tablet      TAKE 1 TABLET  (112 MCG TOTAL) BY MOUTH DAILY.   90 tablet   0   . metFORMIN (GLUCOPHAGE) 500 MG tablet   Oral   Take 500 mg by mouth daily with breakfast.          . nitroGLYCERIN (NITROSTAT) 0.4 MG SL tablet   Sublingual   Place 0.4 mg under the tongue every 5 (five) minutes as needed for chest pain. Up to 3 doses         . pantoprazole (PROTONIX) 40 MG tablet      TAKE 1 TABLET (40 MG TOTAL) BY MOUTH DAILY.   90 tablet   1   . traMADol (ULTRAM) 50 MG tablet   Oral   Take 50 mg by mouth at bedtime.         . traMADol (ULTRAM-ER) 200 MG 24 hr tablet   Oral   Take 200 mg by mouth at bedtime.         Marland Kitchen warfarin (COUMADIN) 2.5 MG tablet      TAKE AS DIRECTED BY ANTICOAGULATION CLINIC   100 tablet   0    BP 140/54  Pulse 78  Temp(Src) 98.9 F (37.2 C) (Oral)  Resp 16  SpO2 94%  Physical Exam  Nursing note and vitals reviewed. Constitutional: He is oriented to person, place, and time. He appears well-developed and well-nourished. No distress.  HENT:  Head: Normocephalic and atraumatic.  2cm superficial, bleeding, horizontal lac R external ear (scapha).  Ttp.   Eyes:  Normal appearance  Neck: Normal range of motion.  Pulmonary/Chest: Effort normal.  Musculoskeletal: Normal range of motion.  Neurological: He is alert and oriented to person, place, and time.  Psychiatric: He has a normal mood and affect. His behavior is normal.    ED Course  Procedures (including critical care time) LACERATION REPAIR Performed by: Remer Macho Authorized by: Remer Macho Consent: Verbal consent obtained. Risks and benefits: risks, benefits and alternatives were discussed Consent given by: patient Patient identity confirmed: provided demographic data Prepped and Draped in normal sterile fashion Wound explored  Laceration Location: R ear  Laceration Length: 2cm  No Foreign Bodies seen or palpated  Anesthesia: local infiltration  Regional anesthetic:  lidocaine 2% w/out  epinephrine  Anesthetic total: 5 ml  Irrigation method: syringe Amount of cleaning: standard  Skin closure: prolene 5.0  Number of sutures: 3  Technique: simple interrupted  Patient tolerance: Patient tolerated the procedure well with no immediate complications.   DIAGNOSTIC STUDIES: Oxygen Saturation is 94% on RA, low by my interpretation.    COORDINATION OF CARE: 8:31 PM- Pt advised of plan for treatment and pt agrees.  Labs Review Labs Reviewed  PROTIME-INR - Abnormal; Notable for the following:    Prothrombin Time 28.4 (*)    INR 2.78 (*)    All other components within normal limits   Imaging Review No results found.   EKG Interpretation None      MDM   Final diagnoses:  Laceration of right ear, external    68yo M on coumadin, INR 2.78 today, presents w/ R external ear lac.  Consulted Maxillofacial and Dr. Migdalia Dk recommends closure and then f/u with her in office.  Wound has been cleaned and tetanus updated by nursing staff. Thrombipad ordered for hemostasis.  Will reassess shortly.  8:42 PM   No hemostasis w/ thrombipad or first round of wound seal + holding pressure, but bleeding slowed w/ placement of sutures.  Dr. Wyvonnia Dusky applied wound seal powder and nursing staff held firm pressure for several minutes and bleeding finally stopped.  Referred to Dr. Migdalia Dk for f/u.  Return precautions discussed.   I personally performed the services described in this documentation, which was scribed in my presence. The recorded information has been reviewed and is accurate.    Remer Macho, PA-C 06/29/13 [phone]

## 2013-06-29 NOTE — Telephone Encounter (Signed)
Opened in error

## 2013-06-30 NOTE — ED Provider Notes (Signed)
Medical screening examination/treatment/procedure(s) were conducted as a shared visit with non-physician practitioner(s) and myself.  I personally evaluated the patient during the encounter.  R ear laceration, on coumadin. Persistent bleeding despite pressure and thrombipad.    EKG Interpretation None        Ezequiel Essex, MD 06/30/13 [phone]

## 2013-07-01 ENCOUNTER — Ambulatory Visit: Payer: Medicare Other | Admitting: Physical Medicine & Rehabilitation

## 2013-07-02 ENCOUNTER — Other Ambulatory Visit: Payer: Self-pay | Admitting: Family

## 2013-07-04 ENCOUNTER — Ambulatory Visit (INDEPENDENT_AMBULATORY_CARE_PROVIDER_SITE_OTHER): Payer: Medicare Other | Admitting: Pharmacist

## 2013-07-04 DIAGNOSIS — Z7901 Long term (current) use of anticoagulants: Secondary | ICD-10-CM | POA: Diagnosis not present

## 2013-07-04 DIAGNOSIS — I4891 Unspecified atrial fibrillation: Secondary | ICD-10-CM

## 2013-07-04 LAB — POCT INR: INR: 3.4

## 2013-07-05 ENCOUNTER — Encounter: Payer: Medicare Other | Attending: Physical Medicine & Rehabilitation

## 2013-07-05 ENCOUNTER — Encounter: Payer: Self-pay | Admitting: Physical Medicine & Rehabilitation

## 2013-07-05 ENCOUNTER — Ambulatory Visit (HOSPITAL_BASED_OUTPATIENT_CLINIC_OR_DEPARTMENT_OTHER): Payer: Medicare Other | Admitting: Physical Medicine & Rehabilitation

## 2013-07-05 VITALS — BP 125/79 | HR 87 | Resp 14 | Ht 75.0 in | Wt 265.4 lb

## 2013-07-05 DIAGNOSIS — G8110 Spastic hemiplegia affecting unspecified side: Secondary | ICD-10-CM

## 2013-07-05 DIAGNOSIS — Z4802 Encounter for removal of sutures: Secondary | ICD-10-CM | POA: Diagnosis not present

## 2013-07-05 DIAGNOSIS — S01309A Unspecified open wound of unspecified ear, initial encounter: Secondary | ICD-10-CM | POA: Diagnosis not present

## 2013-07-05 DIAGNOSIS — M62838 Other muscle spasm: Secondary | ICD-10-CM | POA: Diagnosis not present

## 2013-07-05 DIAGNOSIS — S01311A Laceration without foreign body of right ear, initial encounter: Secondary | ICD-10-CM | POA: Insufficient documentation

## 2013-07-05 MED ORDER — TRAMADOL HCL ER 200 MG PO TB24: 200.0000 mg | ORAL_TABLET | Freq: Every day | ORAL | Status: DC

## 2013-07-05 MED ORDER — TRAMADOL HCL 50 MG PO TABS: 50.0000 mg | ORAL_TABLET | Freq: Every day | ORAL | Status: DC

## 2013-07-05 NOTE — Patient Instructions (Signed)

## 2013-07-05 NOTE — Progress Notes (Signed)
Botox Injection for spasticity using needle EMG guidance and E stim  Dilution: 50 Units/ml  Indication: Severe spasticity which interferes with ADL,mobility and/or hygiene and is unresponsive to medication management and other conservative care  Informed consent was obtained after describing risks and benefits of the procedure with the patient. This includes bleeding, bruising, infection, excessive weakness, or medication side effects. A REMS form is on file and signed.  Needle: 27g 1" needle electrode  Number of units per muscle  Pectoralis0  Biceps100  FCR50  FCU0  FDS50  FDP0  Gastrosoleus0  Hamstrings 200  All injections were done after obtaining appropriate EMG activity and E stinm and after negative drawback for blood. The patient tolerated the procedure well. Post procedure instructions were given. A followup appointment was made.

## 2013-07-08 ENCOUNTER — Telehealth: Payer: Self-pay | Admitting: Family

## 2013-07-08 ENCOUNTER — Other Ambulatory Visit: Payer: Self-pay | Admitting: Family

## 2013-07-08 MED ORDER — ALLOPURINOL 100 MG PO TABS: 200.0000 mg | ORAL_TABLET | Freq: Every day | ORAL | Status: DC

## 2013-07-08 NOTE — Telephone Encounter (Signed)
Requesting refill on allopurinol (ZYLOPRIM) 100 MG tablet

## 2013-07-08 NOTE — Telephone Encounter (Signed)
Rx sent to pharmacy   

## 2013-08-02 ENCOUNTER — Ambulatory Visit (INDEPENDENT_AMBULATORY_CARE_PROVIDER_SITE_OTHER): Payer: Medicare Other

## 2013-08-02 DIAGNOSIS — I4891 Unspecified atrial fibrillation: Secondary | ICD-10-CM

## 2013-08-02 DIAGNOSIS — Z7901 Long term (current) use of anticoagulants: Secondary | ICD-10-CM

## 2013-08-02 LAB — POCT INR: INR: 3.3

## 2013-08-10 ENCOUNTER — Encounter (HOSPITAL_COMMUNITY): Payer: Self-pay | Admitting: Emergency Medicine

## 2013-08-10 ENCOUNTER — Observation Stay (HOSPITAL_COMMUNITY)
Admission: EM | Admit: 2013-08-10 | Discharge: 2013-08-11 | Disposition: A | Discharge status: Home / Self Care | Payer: Medicare Other | Attending: Internal Medicine | Admitting: Internal Medicine

## 2013-08-10 ENCOUNTER — Emergency Department (HOSPITAL_COMMUNITY): Payer: Medicare Other

## 2013-08-10 ENCOUNTER — Inpatient Hospital Stay (HOSPITAL_COMMUNITY): Payer: Medicare Other

## 2013-08-10 DIAGNOSIS — R279 Unspecified lack of coordination: Secondary | ICD-10-CM | POA: Insufficient documentation

## 2013-08-10 DIAGNOSIS — E291 Testicular hypofunction: Secondary | ICD-10-CM

## 2013-08-10 DIAGNOSIS — K219 Gastro-esophageal reflux disease without esophagitis: Secondary | ICD-10-CM | POA: Diagnosis not present

## 2013-08-10 DIAGNOSIS — G4733 Obstructive sleep apnea (adult) (pediatric): Secondary | ICD-10-CM | POA: Diagnosis not present

## 2013-08-10 DIAGNOSIS — Z7901 Long term (current) use of anticoagulants: Secondary | ICD-10-CM | POA: Diagnosis not present

## 2013-08-10 DIAGNOSIS — R42 Dizziness and giddiness: Secondary | ICD-10-CM | POA: Insufficient documentation

## 2013-08-10 DIAGNOSIS — G4700 Insomnia, unspecified: Secondary | ICD-10-CM

## 2013-08-10 DIAGNOSIS — E538 Deficiency of other specified B group vitamins: Secondary | ICD-10-CM

## 2013-08-10 DIAGNOSIS — R5381 Other malaise: Secondary | ICD-10-CM | POA: Diagnosis not present

## 2013-08-10 DIAGNOSIS — R5383 Other fatigue: Secondary | ICD-10-CM

## 2013-08-10 DIAGNOSIS — R002 Palpitations: Secondary | ICD-10-CM | POA: Insufficient documentation

## 2013-08-10 DIAGNOSIS — G8110 Spastic hemiplegia affecting unspecified side: Secondary | ICD-10-CM

## 2013-08-10 DIAGNOSIS — G459 Transient cerebral ischemic attack, unspecified: Secondary | ICD-10-CM

## 2013-08-10 DIAGNOSIS — M6281 Muscle weakness (generalized): Secondary | ICD-10-CM

## 2013-08-10 DIAGNOSIS — E785 Hyperlipidemia, unspecified: Secondary | ICD-10-CM

## 2013-08-10 DIAGNOSIS — I6350 Cerebral infarction due to unspecified occlusion or stenosis of unspecified cerebral artery: Secondary | ICD-10-CM

## 2013-08-10 DIAGNOSIS — G8929 Other chronic pain: Secondary | ICD-10-CM | POA: Insufficient documentation

## 2013-08-10 DIAGNOSIS — I69998 Other sequelae following unspecified cerebrovascular disease: Principal | ICD-10-CM | POA: Insufficient documentation

## 2013-08-10 DIAGNOSIS — R270 Ataxia, unspecified: Secondary | ICD-10-CM

## 2013-08-10 DIAGNOSIS — E039 Hypothyroidism, unspecified: Secondary | ICD-10-CM

## 2013-08-10 DIAGNOSIS — Z9981 Dependence on supplemental oxygen: Secondary | ICD-10-CM | POA: Insufficient documentation

## 2013-08-10 DIAGNOSIS — M109 Gout, unspecified: Secondary | ICD-10-CM | POA: Diagnosis not present

## 2013-08-10 DIAGNOSIS — Z951 Presence of aortocoronary bypass graft: Secondary | ICD-10-CM | POA: Insufficient documentation

## 2013-08-10 DIAGNOSIS — R531 Weakness: Secondary | ICD-10-CM

## 2013-08-10 DIAGNOSIS — I10 Essential (primary) hypertension: Secondary | ICD-10-CM | POA: Diagnosis not present

## 2013-08-10 DIAGNOSIS — Z7982 Long term (current) use of aspirin: Secondary | ICD-10-CM | POA: Diagnosis not present

## 2013-08-10 DIAGNOSIS — R29818 Other symptoms and signs involving the nervous system: Secondary | ICD-10-CM | POA: Diagnosis not present

## 2013-08-10 DIAGNOSIS — R7309 Other abnormal glucose: Secondary | ICD-10-CM

## 2013-08-10 DIAGNOSIS — I4891 Unspecified atrial fibrillation: Secondary | ICD-10-CM

## 2013-08-10 DIAGNOSIS — I2510 Atherosclerotic heart disease of native coronary artery without angina pectoris: Secondary | ICD-10-CM

## 2013-08-10 DIAGNOSIS — J9819 Other pulmonary collapse: Secondary | ICD-10-CM | POA: Diagnosis not present

## 2013-08-10 DIAGNOSIS — Z79899 Other long term (current) drug therapy: Secondary | ICD-10-CM | POA: Diagnosis not present

## 2013-08-10 DIAGNOSIS — R471 Dysarthria and anarthria: Secondary | ICD-10-CM | POA: Diagnosis not present

## 2013-08-10 DIAGNOSIS — H8110 Benign paroxysmal vertigo, unspecified ear: Secondary | ICD-10-CM

## 2013-08-10 DIAGNOSIS — E119 Type 2 diabetes mellitus without complications: Secondary | ICD-10-CM | POA: Diagnosis present

## 2013-08-10 DIAGNOSIS — J309 Allergic rhinitis, unspecified: Secondary | ICD-10-CM

## 2013-08-10 DIAGNOSIS — R930 Abnormal findings on diagnostic imaging of skull and head, not elsewhere classified: Secondary | ICD-10-CM | POA: Diagnosis not present

## 2013-08-10 DIAGNOSIS — R209 Unspecified disturbances of skin sensation: Secondary | ICD-10-CM

## 2013-08-10 HISTORY — DX: Ataxia, unspecified: R27.0

## 2013-08-10 LAB — GLUCOSE, CAPILLARY
GLUCOSE-CAPILLARY: 100 mg/dL — AB (ref 70–99)
Glucose-Capillary: 155 mg/dL — ABNORMAL HIGH (ref 70–99)

## 2013-08-10 LAB — CBC
HEMATOCRIT: 40.2 % (ref 39.0–52.0)
Hemoglobin: 13.2 g/dL (ref 13.0–17.0)
MCH: 28.2 pg (ref 26.0–34.0)
MCHC: 32.8 g/dL (ref 30.0–36.0)
MCV: 85.9 fL (ref 78.0–100.0)
Platelets: 172 10*3/uL (ref 150–400)
RBC: 4.68 MIL/uL (ref 4.22–5.81)
RDW: 14.1 % (ref 11.5–15.5)
WBC: 6.1 10*3/uL (ref 4.0–10.5)

## 2013-08-10 LAB — I-STAT CHEM 8, ED
BUN: 18 mg/dL (ref 6–23)
CALCIUM ION: 1.23 mmol/L (ref 1.13–1.30)
Chloride: 101 mEq/L (ref 96–112)
Creatinine, Ser: 1.1 mg/dL (ref 0.50–1.35)
GLUCOSE: 148 mg/dL — AB (ref 70–99)
HCT: 43 % (ref 39.0–52.0)
Hemoglobin: 14.6 g/dL (ref 13.0–17.0)
Potassium: 3.8 mEq/L (ref 3.7–5.3)
Sodium: 143 mEq/L (ref 137–147)
TCO2: 27 mmol/L (ref 0–100)

## 2013-08-10 LAB — COMPREHENSIVE METABOLIC PANEL
ALBUMIN: 3.7 g/dL (ref 3.5–5.2)
ALK PHOS: 54 U/L (ref 39–117)
ALT: 37 U/L (ref 0–53)
AST: 35 U/L (ref 0–37)
BILIRUBIN TOTAL: 0.4 mg/dL (ref 0.3–1.2)
BUN: 18 mg/dL (ref 6–23)
CHLORIDE: 102 meq/L (ref 96–112)
CO2: 27 mEq/L (ref 19–32)
Calcium: 9.6 mg/dL (ref 8.4–10.5)
Creatinine, Ser: 1.02 mg/dL (ref 0.50–1.35)
GFR calc non Af Amer: 72 mL/min — ABNORMAL LOW (ref >90)
GFR, EST AFRICAN AMERICAN: 84 mL/min — AB (ref >90)
GLUCOSE: 147 mg/dL — AB (ref 70–99)
POTASSIUM: 3.9 meq/L (ref 3.7–5.3)
Sodium: 141 mEq/L (ref 137–147)
Total Protein: 7.4 g/dL (ref 6.0–8.3)

## 2013-08-10 LAB — URINALYSIS, ROUTINE W REFLEX MICROSCOPIC
Bilirubin Urine: NEGATIVE
Glucose, UA: NEGATIVE mg/dL
Hgb urine dipstick: NEGATIVE
Ketones, ur: NEGATIVE mg/dL
Leukocytes, UA: NEGATIVE
Nitrite: NEGATIVE
PH: 6 (ref 5.0–8.0)
Protein, ur: NEGATIVE mg/dL
SPECIFIC GRAVITY, URINE: 1.026 (ref 1.005–1.030)
UROBILINOGEN UA: 2 mg/dL — AB (ref 0.0–1.0)

## 2013-08-10 LAB — DIFFERENTIAL
Basophils Absolute: 0.1 10*3/uL (ref 0.0–0.1)
Basophils Relative: 1 % (ref 0–1)
EOS ABS: 0.4 10*3/uL (ref 0.0–0.7)
EOS PCT: 7 % — AB (ref 0–5)
Lymphocytes Relative: 19 % (ref 12–46)
Lymphs Abs: 1.1 10*3/uL (ref 0.7–4.0)
MONOS PCT: 8 % (ref 3–12)
Monocytes Absolute: 0.5 10*3/uL (ref 0.1–1.0)
NEUTROS PCT: 65 % (ref 43–77)
Neutro Abs: 4.1 10*3/uL (ref 1.7–7.7)

## 2013-08-10 LAB — RAPID URINE DRUG SCREEN, HOSP PERFORMED
Amphetamines: NOT DETECTED
BARBITURATES: NOT DETECTED
Benzodiazepines: NOT DETECTED
Cocaine: NOT DETECTED
Opiates: NOT DETECTED
Tetrahydrocannabinol: NOT DETECTED

## 2013-08-10 LAB — APTT: APTT: 38 s — AB (ref 24–37)

## 2013-08-10 LAB — PROTIME-INR
INR: 2.53 — ABNORMAL HIGH (ref 0.00–1.49)
Prothrombin Time: 26.4 seconds — ABNORMAL HIGH (ref 11.6–15.2)

## 2013-08-10 LAB — I-STAT TROPONIN, ED: Troponin i, poc: 0 ng/mL (ref 0.00–0.08)

## 2013-08-10 LAB — ETHANOL: Alcohol, Ethyl (B): <11 mg/dL (ref 0–11)

## 2013-08-10 LAB — VITAMIN B12: VITAMIN B 12: 263 pg/mL (ref 211–911)

## 2013-08-10 LAB — TSH: TSH: 0.879 u[IU]/mL (ref 0.350–4.500)

## 2013-08-10 MED ORDER — ATORVASTATIN CALCIUM 80 MG PO TABS
80.0000 mg | ORAL_TABLET | Freq: Every day | ORAL | Status: DC
  Administered 2013-08-10 – 2013-08-11 (×2): 80 mg via ORAL
  Filled 2013-08-10 (×2): qty 1

## 2013-08-10 MED ORDER — INSULIN ASPART 100 UNIT/ML ~~LOC~~ SOLN
0.0000 [IU] | Freq: Three times a day (TID) | SUBCUTANEOUS | Status: DC
Start: 2013-08-10 — End: 2013-08-11
  Administered 2013-08-10: 2 [IU] via SUBCUTANEOUS

## 2013-08-10 MED ORDER — SENNOSIDES-DOCUSATE SODIUM 8.6-50 MG PO TABS
1.0000 | ORAL_TABLET | Freq: Every evening | ORAL | Status: DC | PRN
  Filled 2013-08-10: qty 1

## 2013-08-10 MED ORDER — LEVOTHYROXINE SODIUM 112 MCG PO TABS
112.0000 ug | ORAL_TABLET | Freq: Every day | ORAL | Status: DC
  Administered 2013-08-11: 112 ug via ORAL
  Filled 2013-08-10 (×2): qty 1

## 2013-08-10 MED ORDER — LORATADINE 10 MG PO TABS
10.0000 mg | ORAL_TABLET | Freq: Every day | ORAL | Status: DC
  Administered 2013-08-10 – 2013-08-11 (×2): 10 mg via ORAL
  Filled 2013-08-10 (×2): qty 1

## 2013-08-10 MED ORDER — FUROSEMIDE 40 MG PO TABS: 40.0000 mg | ORAL_TABLET | ORAL | Status: DC

## 2013-08-10 MED ORDER — ASPIRIN 81 MG PO TABS: 81.0000 mg | ORAL_TABLET | Freq: Every day | ORAL | Status: DC

## 2013-08-10 MED ORDER — NITROGLYCERIN 0.4 MG SL SUBL: 0.4000 mg | SUBLINGUAL_TABLET | SUBLINGUAL | Status: DC | PRN

## 2013-08-10 MED ORDER — WARFARIN - PHARMACIST DOSING INPATIENT: Freq: Every day | Status: DC

## 2013-08-10 MED ORDER — WARFARIN SODIUM 2.5 MG PO TABS
2.5000 mg | ORAL_TABLET | ORAL | Status: DC
  Administered 2013-08-11: 2.5 mg via ORAL
  Filled 2013-08-10: qty 1

## 2013-08-10 MED ORDER — BENAZEPRIL HCL 10 MG PO TABS
10.0000 mg | ORAL_TABLET | Freq: Every day | ORAL | Status: DC
  Administered 2013-08-11: 10 mg via ORAL
  Filled 2013-08-10 (×2): qty 1

## 2013-08-10 MED ORDER — ASPIRIN 81 MG PO CHEW
81.0000 mg | CHEWABLE_TABLET | Freq: Every day | ORAL | Status: DC
  Administered 2013-08-10: 81 mg via ORAL
  Filled 2013-08-10 (×2): qty 1

## 2013-08-10 MED ORDER — ALLOPURINOL 100 MG PO TABS
200.0000 mg | ORAL_TABLET | Freq: Every day | ORAL | Status: DC
  Administered 2013-08-11: 200 mg via ORAL
  Filled 2013-08-10: qty 2

## 2013-08-10 MED ORDER — FUROSEMIDE 40 MG PO TABS
40.0000 mg | ORAL_TABLET | ORAL | Status: DC
  Filled 2013-08-10: qty 1

## 2013-08-10 MED ORDER — TRAMADOL HCL 50 MG PO TABS: 50.0000 mg | ORAL_TABLET | Freq: Two times a day (BID) | ORAL | Status: DC

## 2013-08-10 MED ORDER — PANTOPRAZOLE SODIUM 40 MG PO TBEC
40.0000 mg | DELAYED_RELEASE_TABLET | Freq: Every day | ORAL | Status: DC
  Administered 2013-08-11: 40 mg via ORAL
  Filled 2013-08-10: qty 1

## 2013-08-10 MED ORDER — DILTIAZEM HCL ER COATED BEADS 180 MG PO CP24
180.0000 mg | ORAL_CAPSULE | Freq: Every day | ORAL | Status: DC
  Administered 2013-08-11: 180 mg via ORAL
  Filled 2013-08-10: qty 1

## 2013-08-10 MED ORDER — TRAMADOL HCL 50 MG PO TABS
50.0000 mg | ORAL_TABLET | Freq: Two times a day (BID) | ORAL | Status: DC
  Administered 2013-08-10 – 2013-08-11 (×2): 50 mg via ORAL
  Filled 2013-08-10 (×2): qty 1

## 2013-08-10 MED ORDER — FLUTICASONE PROPIONATE 50 MCG/ACT NA SUSP
1.0000 | Freq: Every day | NASAL | Status: DC
  Administered 2013-08-10: 1 via NASAL
  Filled 2013-08-10: qty 16

## 2013-08-10 MED ORDER — WARFARIN 1.25 MG HALF TABLET: 1.2500 mg | ORAL_TABLET | ORAL | Status: DC

## 2013-08-10 NOTE — Progress Notes (Signed)
ANTICOAGULATION CONSULT NOTE - Initial Consult  Pharmacy Consult for coumadin Indication: atrial fibrillation  No Known Allergies  Patient Measurements: Height: 6\' 3"  (190.5 cm) Weight: 260 lb (117.935 kg) IBW/kg (Calculated) : 84.5   Vital Signs: Temp: 97.3 F (36.3 C) (05/27 1445) Temp src: Oral (05/27 1445) BP: 131/72 mmHg (05/27 1445) Pulse Rate: 70 (05/27 1445)  Labs:  Recent Labs  08/10/13 0930 08/10/13 0945 08/10/13 0957  HGB  --  13.2 14.6  HCT  --  40.2 43.0  PLT  --  172  --   APTT 38*  --   --   LABPROT 26.4*  --   --   INR 2.53*  --   --   CREATININE  --  1.02 1.10    Estimated Creatinine Clearance: 86.5 ml/min (by C-G formula based on Cr of 1.1).   Medical History: Past Medical History  Diagnosis Date  . GERD (gastroesophageal reflux disease)   . Gout   . Hyperlipidemia   . Hypertension   . CVA (cerebral infarction)     left hemiparesis  . Chronic pain     left sided-Kristeins  . CAD (coronary artery disease)     s/p CABG, s/p DES to LCX  January 2011  . OSA on CPAP   . Sleep apnea   . History of colonoscopy   . Stroke   . Dysrhythmia     hx of atrial fibrilation with cardioversion    Medications:  Prescriptions prior to admission  Medication Sig Dispense Refill  . allopurinol (ZYLOPRIM) 100 MG tablet Take 2 tablets (200 mg total) by mouth daily.  60 tablet  3  . aspirin 81 MG tablet Take 81 mg by mouth daily.       . benazepril (LOTENSIN) 10 MG tablet Take 10 mg by mouth daily.      Marland Kitchen diltiazem (CARDIZEM CD) 180 MG 24 hr capsule Take 180 mg by mouth daily.      . furosemide (LASIX) 40 MG tablet Take 40 mg by mouth every other day.      . levothyroxine (SYNTHROID, LEVOTHROID) 112 MCG tablet Take 112 mcg by mouth daily before breakfast.      . metFORMIN (GLUCOPHAGE) 500 MG tablet Take 500 mg by mouth daily with breakfast.       . nitroGLYCERIN (NITROSTAT) 0.4 MG SL tablet Place 0.4 mg under the tongue every 5 (five) minutes as needed  for chest pain. Up to 3 doses      . pantoprazole (PROTONIX) 40 MG tablet Take 40 mg by mouth daily.      . rosuvastatin (CRESTOR) 40 MG tablet Take 40 mg by mouth daily.      . traMADol (ULTRAM) 50 MG tablet Take 50 mg by mouth 2 (two) times daily.      . traMADol (ULTRAM-ER) 200 MG 24 hr tablet Take 200 mg by mouth daily.      Marland Kitchen warfarin (COUMADIN) 2.5 MG tablet Take 1.25-2.5 mg by mouth daily. Take 1.25mg  by mouth on Tues and Sat. Take 2.5mg  by mouth all other days        Assessment: 68 yo M admitted with L sided weakness.  Pharmacy consulted to continue coumadin he is on for afib.  His home dose is 2.5 mg daily except 1.25 mg on Tuesdays and Saturdays.  He took dose at home today.  INR today on admission is therapeutic at 2.53.  CBC WNL and no bleeding reported.   Goal of Therapy:  INR 2-3   Plan:  1. Continue home dose of 2.5 mg daily except 1.25 mg on Tuesdays and Saturdays.  He took dose at home today 2. Daily INR for now Eudelia Bunch, Pharm.D. [phone] 08/10/2013 4:21 PM

## 2013-08-10 NOTE — Consult Note (Signed)
Referring Physician: Regalado    Chief Complaint: left sided weakness  HPI:                                                                                                                                         [name] is an 68 y.o. 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 Patient states for over three weeks he has had a sinus infections that has progressively become worse.  Over the last three days he has noted worsening decreased sensation of his left face and arm, slight weakness of left leg and light headed ness when he stands. At baseline he has decreased sensation of left face, arm and leg and mild weakness of left arm and leg from previous stroke. His main complaint at this time is the sinus pressure behind his eyes.   BP on arrival 131/78 Afebrile UA negative UDS negative  Date last known well: Date: 08/07/2013 Time last known well: Unable to determine tPA Given: No: out of window  Past Medical History  Diagnosis Date  . GERD (gastroesophageal reflux disease)   . Gout   . Hyperlipidemia   . Hypertension   . CVA (cerebral infarction)     left hemiparesis  . Chronic pain     left sided-Kristeins  . CAD (coronary artery disease)     s/p CABG, s/p DES to LCX  January 2011  . OSA on CPAP   . Sleep apnea   . History of colonoscopy   . Stroke   . Dysrhythmia     hx of atrial fibrilation with cardioversion    Past Surgical History  Procedure Laterality Date  . Coronary artery bypass graft      stent  . Esophagogastroduodenoscopy  03-25-2005  . Nasal septum surgery    . Percutaneous placement intravascular stent cervical carotid artery      03-2009; using a drug-eluting platform of the circumflex cornoray artery with a 3.0 x 18 [employer] Promus drug-eluting platform post dilated to 3.75 with a  noncompliant balloon.  . Cataract extraction  05/2010    left eye  . Cataract extraction  04/2010    right eye  . Cardioversion  06/20/2011    Procedure: CARDIOVERSION;  Surgeon: Larey Dresser, MD;  Location: [facility] ENDOSCOPY;  Service: Cardiovascular;  Laterality: N/A;  . Tee without cardioversion  06/20/2011    Procedure: TRANSESOPHAGEAL ECHOCARDIOGRAM (TEE);  Surgeon: Larey Dresser, MD;  Location: [facility] ENDOSCOPY;  Service: Cardiovascular;  Laterality: N/A;    Family History  Problem Relation Age of Onset  . Lung cancer Father     deceased  . Stroke Mother     deceased-MINISTROKES   Social History:  reports that he has never smoked.  He has never used smokeless tobacco. He reports that he does not drink alcohol or use illicit drugs.  Allergies: No Known Allergies  Medications:                                                                                                                           Prior to Admission:  Prescriptions prior to admission  Medication Sig Dispense Refill  . allopurinol (ZYLOPRIM) 100 MG tablet Take 2 tablets (200 mg total) by mouth daily.  60 tablet  3  . aspirin 81 MG tablet Take 81 mg by mouth daily.       . benazepril (LOTENSIN) 10 MG tablet Take 10 mg by mouth daily.      Marland Kitchen diltiazem (CARDIZEM CD) 180 MG 24 hr capsule Take 180 mg by mouth daily.      . furosemide (LASIX) 40 MG tablet Take 40 mg by mouth every other day.      . levothyroxine (SYNTHROID, LEVOTHROID) 112 MCG tablet Take 112 mcg by mouth daily before breakfast.      . metFORMIN (GLUCOPHAGE) 500 MG tablet Take 500 mg by mouth daily with breakfast.       . nitroGLYCERIN (NITROSTAT) 0.4 MG SL tablet Place 0.4 mg under the tongue every 5 (five) minutes as needed for chest pain. Up to 3 doses      . pantoprazole (PROTONIX) 40 MG tablet Take 40 mg by mouth daily.      . rosuvastatin (CRESTOR) 40 MG tablet Take 40 mg by mouth daily.      . traMADol (ULTRAM) 50 MG tablet Take 50 mg by mouth 2 (two)  times daily.      . traMADol (ULTRAM-ER) 200 MG 24 hr tablet Take 200 mg by mouth daily.      Marland Kitchen warfarin (COUMADIN) 2.5 MG tablet Take 1.25-2.5 mg by mouth daily. Take 1.25mg  by mouth on Tues and Sat. Take 2.5mg  by mouth all other days       Scheduled: . [START ON 08/11/2013] allopurinol  200 mg Oral Daily  . aspirin  81 mg Oral Daily  . atorvastatin  80 mg Oral q1800  . benazepril  10 mg Oral Daily  . [START ON 08/11/2013] diltiazem  180 mg Oral Daily  . fluticasone  1 spray Each Nare Daily  . [START ON 08/11/2013] furosemide  40 mg Oral QODAY  . insulin aspart  0-9 Units Subcutaneous TID WC  . [START ON 08/11/2013] levothyroxine  112 mcg Oral QAC breakfast  . [START ON 08/11/2013] pantoprazole  40 mg Oral Daily  . traMADol  50 mg Oral BID    ROS:  History obtained from the patient  General ROS: negative for - chills, fatigue, fever, night sweats, weight gain or weight loss Psychological ROS: negative for - behavioral disorder, hallucinations, memory difficulties, mood swings or suicidal ideation Ophthalmic ROS: negative for - blurry vision, double vision, eye pain or loss of vision ENT ROS: negative for - epistaxis, nasal discharge, oral lesions, sore throat, tinnitus or vertigo Allergy and Immunology ROS: negative for - hives or itchy/watery eyes Hematological and Lymphatic ROS: negative for - bleeding problems, bruising or swollen lymph nodes Endocrine ROS: negative for - galactorrhea, hair pattern changes, polydipsia/polyuria or temperature intolerance Respiratory ROS: negative for - cough, hemoptysis, shortness of breath or wheezing Cardiovascular ROS: negative for - chest pain, dyspnea on exertion, edema or irregular heartbeat Gastrointestinal ROS: negative for - abdominal pain, diarrhea, hematemesis, nausea/vomiting or stool incontinence Genito-Urinary  ROS: negative for - dysuria, hematuria, incontinence or urinary frequency/urgency Musculoskeletal ROS: negative for - joint swelling or muscular weakness Neurological ROS: as noted in HPI Dermatological ROS: negative for rash and skin lesion changes  Neurologic Examination:                                                                                                      Blood pressure 138/91, pulse 70, temperature 97.3 F (36.3 C), temperature source Oral, resp. rate 18, height 6\' 3"  (1.905 m), weight 117.935 kg (260 lb), SpO2 97.00%.  Mental Status: Alert, oriented, thought content appropriate.  Speech fluent without evidence of aphasia.  Able to follow 3 step commands without difficulty. Cranial Nerves: II: Discs flat bilaterally; Visual fields grossly normal, pupils equal, round, reactive to light and accommodation III,IV, VI: ptosis not present, extra-ocular motions intact bilaterally V,VII: smile symmetric, facial light touch sensation decreased on the left (old) VIII: hearing normal bilaterally IX,X: gag reflex present XI: bilateral shoulder shrug XII: midline tongue extension without atrophy or fasciculations  Motor: Right : Upper extremity   5/5    Left:     Upper extremity   5/5  Lower extremity   5/5     Lower extremity   5/5 Tone and bulk:normal tone throughout; no atrophy noted Sensory: Pinprick and light touch intact throughout, bilaterally Deep Tendon Reflexes:  Right: Upper Extremity   Left: Upper extremity   biceps (C-5 to C-6) 2/4   biceps (C-5 to C-6) 2/4 tricep (C7) 2/4    triceps (C7) 2/4 Brachioradialis (C6) 2/4  Brachioradialis (C6) 2/4  Lower Extremity Lower Extremity  quadriceps (L-2 to L-4) 2/4   quadriceps (L-2 to L-4) 2/4 Achilles (S1) 2/4   Achilles (S1) 2/4  Plantars: Right: downgoing   Left: up going Cerebellar: normal finger-to-nose,  normal heel-to-shin test Gait: not tested CV: pulses palpable throughout    Lab Results: Basic Metabolic  Panel:  Recent Labs Lab 08/10/13 0945 08/10/13 0957  NA 141 143  K 3.9 3.8  CL 102 101  CO2 27  --   GLUCOSE 147* 148*  BUN 18 18  CREATININE 1.02 1.10  CALCIUM 9.6  --     Liver Function Tests:  Recent  Labs Lab 08/10/13 0945  AST 35  ALT 37  ALKPHOS 54  BILITOT 0.4  PROT 7.4  ALBUMIN 3.7   No results found for this basename: LIPASE, AMYLASE,  in the last 168 hours No results found for this basename: AMMONIA,  in the last 168 hours  CBC:  Recent Labs Lab 08/10/13 0945 08/10/13 0957  WBC 6.1  --   NEUTROABS 4.1  --   HGB 13.2 14.6  HCT 40.2 43.0  MCV 85.9  --   PLT 172  --     Cardiac Enzymes: No results found for this basename: CKTOTAL, CKMB, CKMBINDEX, TROPONINI,  in the last 168 hours  Lipid Panel: No results found for this basename: CHOL, TRIG, HDL, CHOLHDL, VLDL, LDLCALC,  in the last 168 hours  CBG: No results found for this basename: GLUCAP,  in the last 168 hours  Microbiology: Results for orders placed during the hospital encounter of 06/17/11  CULTURE, BLOOD (ROUTINE X 2)     Status: None   Collection Time    06/17/11  1:10 PM      Result Value Ref Range Status   Specimen Description BLOOD HAND RIGHT   Final   Special Requests     Final   Value: BOTTLES DRAWN AEROBIC AND ANAEROBIC 10CC AER 5CC ANA   Culture  Setup Time [id number]   Final   Culture NO GROWTH 5 DAYS   Final   Report Status 06/23/2011 FINAL   Final  CULTURE, BLOOD (ROUTINE X 2)     Status: None   Collection Time    06/17/11  1:20 PM      Result Value Ref Range Status   Specimen Description BLOOD ARM RIGHT   Final   Special Requests BOTTLES DRAWN AEROBIC AND ANAEROBIC 10CC   Final   Culture  Setup Time [id number]   Final   Culture NO GROWTH 5 DAYS   Final   Report Status 06/23/2011 FINAL   Final  MRSA PCR SCREENING     Status: None   Collection Time    06/17/11  5:41 PM      Result Value Ref Range Status   MRSA by PCR NEGATIVE  NEGATIVE Final   Comment:             The GeneXpert MRSA Assay (FDA     approved for NASAL specimens     only), is one component of a     comprehensive MRSA colonization     surveillance program. It is not     intended to diagnose MRSA     infection nor to guide or     monitor treatment for     MRSA infections.    Coagulation Studies:  Recent Labs  08/10/13 0930  LABPROT 26.4*  INR 2.53*    Imaging: Dg Chest 2 View  08/10/2013   CLINICAL DATA:  Heart palpitations  EXAM: CHEST  2 VIEW  COMPARISON:  CT 07/05/2011  FINDINGS: Sternotomy wires overlie normal cardiac silhouette. No effusion, infiltrate, or pneumothorax. There is chronic elevation of the right hemidiaphragm with mild associated atelectasis. Degenerative osteophytosis of the thoracic spine.  IMPRESSION: 1. No interval change compared to prior. 2. Mild right basilar atelectasis associated with elevated right hemidiaphragm.   Electronically Signed   By: Suzy Bouchard M.D.   On: 08/10/2013 11:07   Ct Head Wo Contrast  08/10/2013   CLINICAL DATA:  Patient states his face feels funny on the left side. Rapid heartbeat  for 2 days.  EXAM: CT HEAD WITHOUT CONTRAST  TECHNIQUE: Contiguous axial images were obtained from the base of the skull through the vertex without intravenous contrast.  COMPARISON:  11/16/2012  FINDINGS: There is no evidence of mass effect, midline shift, or extra-axial fluid collections. There is no evidence of a space-occupying lesion or intracranial hemorrhage. There is no evidence of a cortical-based area of acute infarction. There is an old right subinsular lacunar infarct. There is generalized cerebral atrophy. There is periventricular white matter low attenuation likely secondary to microangiopathy.  The ventricles and sulci are appropriate for the patient's age. The basal cisterns are patent.  Visualized portions of the orbits are unremarkable. The visualized portions of the paranasal sinuses and mastoid air cells are unremarkable.  The osseous  structures are unremarkable.  IMPRESSION: No acute intracranial pathology.   Electronically Signed   By: Kathreen Devoid   On: 08/10/2013 10:22   Mr Brain Wo Contrast  08/10/2013   CLINICAL DATA:  Ataxia left-sided weakness.  EXAM: MRI HEAD WITHOUT CONTRAST  TECHNIQUE: Multiplanar, multiecho pulse sequences of the brain and surrounding structures were obtained without intravenous contrast.  COMPARISON:  CT head without contrast 08/10/2013. MRI brain 12/30/2009.  FINDINGS: A remote hemorrhagic infarct is evident within the posterior right insula and basal ganglia. Asymmetric periventricular white matter changes are present on the right. Additional remote lacunar infarcts are present within the basal ganglia bilaterally without change.  No acute cortical infarct, hemorrhage, or mass lesion is present ventricles are of normal size. No significant extra-axial fluid collection is present.  Flow is present in the major intracranial arteries. The patient is status post bilateral lens replacements. Mild mucosal thickening is present throughout the anterior ethmoid air cells. Asymmetric mucosal disease is present in the left sphenoid sinus. The mastoid air cells are clear.  IMPRESSION: 1. No acute intracranial abnormality. 2. Age advanced atrophy and diffuse white matter disease, asymmetric on the right. This likely reflects the sequelae of chronic microvascular ischemia. 3. Remote hemorrhagic infarct of the posterior right insular cortex and basal ganglia. 4. Additional nonhemorrhagic remote lacunar infarcts of the basal ganglia bilaterally.   Electronically Signed   By: Lawrence Santiago M.D.   On: 08/10/2013 14:09       Assessment and plan discussed with with attending physician and they are in agreement.    Etta Quill PA-C [facility] [phone]  08/10/2013, 3:52 PM   Assessment: 68 y.o. male with history of right insular cortex and BG infarct with residual left sided paresthesia and mild  weakness. Patient presents with worsening symptoms of left face, arm and leg decrease sensation in the setting of nasal congestion. MRI brain shows no acute infarct and INR is therapeutic at 2.53.  Due to symptoms being present for >3 days, TIA is unlikely.  Likely worsening of previous symptoms in setting of sinus infection.   Stroke Risk Factors - atrial fibrillation, hyperlipidemia, hypertension and CAD   Recommend: 1) Continue ASA and coumadin at current dose. 2) A1c and FLP 3) Carotid doppler have been ordered.   4) PT/OT   I personally participated: in this patient's evaluation and management, including formulating the above clinical impression and management recommendations.  Rush Farmer M.D. [facility] [phone]

## 2013-08-10 NOTE — ED Notes (Signed)
Heart diet ordered.  

## 2013-08-10 NOTE — Progress Notes (Signed)
Pt removed both RIGHT and LEFT hearing aids prior to CT Head. Pt replaced both hearing aids before leaving CT 1.

## 2013-08-10 NOTE — ED Provider Notes (Signed)
CSN: [id number]     Arrival date & time 08/10/13  1829 History   First MD Initiated Contact with Patient 08/10/13 0935     Chief Complaint  Patient presents with  . Irregular Heart Beat     (Consider location/radiation/quality/duration/timing/severity/associated sxs/prior Treatment) HPI Comments: Patient presents with a three-day history of palpitations, lightheadedness, worsening weakness on his left side. Reports previous stroke with weakness on the left which seems a little bit worse. He endorses feeling lightheaded and like he is going to pass out. Denies any chest pain or shortness of breath. He is having problems with his balance and feel like his fall over. Denies any cough or fever. He is on Coumadin for previous history of stroke. Denies any falls,. He denies any difficulty swallowing or difficulty speaking. Denies any focal numbness or tingling. He feels his left-sided weakness is a bit worse. Last seen normal with 3 days ago.  The history is provided by the patient.    Past Medical History  Diagnosis Date  . GERD (gastroesophageal reflux disease)   . Gout   . Hyperlipidemia   . Hypertension   . CVA (cerebral infarction)     left hemiparesis  . Chronic pain     left sided-K[place]ins  . CAD (coronary artery disease)     s/p CABG, s/p DES to LCX  January 2011  . OSA on CPAP   . Sleep apnea   . History of colonoscopy   . Stroke   . Dysrhythmia     hx of atrial fibrilation with cardioversion   Past Surgical History  Procedure Laterality Date  . Coronary artery bypass graft      stent  . Esophagogastroduodenoscopy  03-25-2005  . Nasal septum surgery    . Percutaneous placement intravascular stent cervical carotid artery      03-2009; using a drug-eluting platform of the circumflex cornoray artery with a 3.0 x 18 [employer] Promus drug-eluting platform post dilated to 3.75 with a noncompliant balloon.  . Cataract extraction  05/2010    left eye  . Cataract extraction   04/2010    right eye  . Cardioversion  06/20/2011    Procedure: CARDIOVERSION;  Surgeon: Larey Dresser, MD;  Location: [facility] ENDOSCOPY;  Service: Cardiovascular;  Laterality: N/A;  . Tee without cardioversion  06/20/2011    Procedure: TRANSESOPHAGEAL ECHOCARDIOGRAM (TEE);  Surgeon: Larey Dresser, MD;  Location: [facility] ENDOSCOPY;  Service: Cardiovascular;  Laterality: N/A;   Family History  Problem Relation Age of Onset  . Lung cancer Father     deceased  . Stroke Mother     deceased-MINISTROKES   History  Substance Use Topics  . Smoking status: Never Smoker   . Smokeless tobacco: Never Used  . Alcohol Use: No    Review of Systems  Constitutional: Negative for fever and activity change.  HENT: Negative for congestion and rhinorrhea.   Eyes: Negative for visual disturbance.  Respiratory: Negative for cough, chest tightness and shortness of breath.   Cardiovascular: Positive for palpitations. Negative for chest pain.  Gastrointestinal: Negative for nausea, vomiting and abdominal pain.  Endocrine: Negative for polyuria.  Genitourinary: Negative for dysuria and hematuria.  Musculoskeletal: Negative for arthralgias and myalgias.  Neurological: Positive for dizziness, weakness and light-headedness. Negative for headaches.  A complete 10 system review of systems was obtained and all systems are negative except as noted in the HPI and PMH.      Allergies  Review of patient's allergies indicates no  known allergies.  Home Medications   Prior to Admission medications   Medication Sig Start Date End Date Taking? Authorizing Provider  furosemide (LASIX) 40 MG tablet Take 40 mg by mouth every other day.   Yes Historical Provider, MD  allopurinol (ZYLOPRIM) 100 MG tablet Take 2 tablets (200 mg total) by mouth daily. 07/08/13   Debbrah Alar, NP  aspirin 81 MG tablet Take 81 mg by mouth daily.     Historical Provider, MD  benazepril (LOTENSIN) 10 MG tablet Take 10 mg by mouth daily.     Historical Provider, MD  diltiazem (CARDIZEM CD) 180 MG 24 hr capsule Take 180 mg by mouth daily.    Historical Provider, MD  diltiazem (DILACOR XR) 180 MG 24 hr capsule Take 180 mg by mouth daily.    Historical Provider, MD  fluticasone (FLONASE) 50 MCG/ACT nasal spray Place 2 sprays into both nostrils daily. 05/21/13   Debbrah Alar, NP  furosemide (LASIX) 40 MG tablet Take 40 mg by mouth daily.    Historical Provider, MD  levothyroxine (SYNTHROID, LEVOTHROID) 112 MCG tablet Take 112 mcg by mouth daily before breakfast.    Historical Provider, MD  metFORMIN (GLUCOPHAGE) 500 MG tablet Take 500 mg by mouth daily with breakfast.  12/11/11   Burnice Logan, MD  nitroGLYCERIN (NITROSTAT) 0.4 MG SL tablet Place 0.4 mg under the tongue every 5 (five) minutes as needed for chest pain. Up to 3 doses    Historical Provider, MD  pantoprazole (PROTONIX) 40 MG tablet Take 40 mg by mouth daily.    Historical Provider, MD  rosuvastatin (CRESTOR) 40 MG tablet Take 40 mg by mouth daily.    Historical Provider, MD  traMADol (ULTRAM) 50 MG tablet Take 1 tablet (50 mg total) by mouth at bedtime. 07/05/13   Charlett Blake, MD  traMADol (ULTRAM-ER) 200 MG 24 hr tablet Take 1 tablet (200 mg total) by mouth at bedtime. 07/05/13   Charlett Blake, MD  warfarin (COUMADIN) 2.5 MG tablet TAKE AS DIRECTED BY ANTICOAGULATION CLINIC 06/01/13   Larey Dresser, MD   BP 131/72  Pulse 70  Temp(Src) 97.3 F (36.3 C) (Oral)  Resp 18  Ht 6\' 3"  (1.905 m)  Wt 260 lb (117.935 kg)  BMI 32.50 kg/m2  SpO2 97% Physical Exam  Constitutional: He is oriented to person, place, and time. He appears well-developed and well-nourished. No distress.  Mild dysarthria at baseline  HENT:  Head: Normocephalic and atraumatic.  Mouth/Throat: Oropharynx is clear and moist. No oropharyngeal exudate.  Eyes: Conjunctivae and EOM are normal. Pupils are equal, round, and reactive to light.  Neck: Normal range of motion. Neck supple.   Cardiovascular: Normal rate, regular rhythm and normal heart sounds.   No murmur heard. Pulmonary/Chest: Effort normal and breath sounds normal. No respiratory distress. He has no wheezes.  Abdominal: Soft. There is no tenderness. There is no rebound and no guarding.  Musculoskeletal: Normal range of motion. He exhibits no edema and no tenderness.  Neurological: He is alert and oriented to person, place, and time. No cranial nerve deficit. He exhibits normal muscle tone. Coordination normal.  CN 2-12 intact, ears nose normal on the right. Ataxia on finger to nose on the left. 5 out of 5 strength on the right side. 4-5 strength in the left upper extremity and left lower extremity which patient says is worse than usual. Wide-base gait.  Skin: Skin is warm.    ED Course  Procedures (including critical care  time) Labs Review Labs Reviewed  PROTIME-INR - Abnormal; Notable for the following:    Prothrombin Time 26.4 (*)    INR 2.53 (*)    All other components within normal limits  COMPREHENSIVE METABOLIC PANEL - Abnormal; Notable for the following:    Glucose, Bld 147 (*)    GFR calc non Af Amer 72 (*)    GFR calc Af Amer 84 (*)    All other components within normal limits  DIFFERENTIAL - Abnormal; Notable for the following:    Eosinophils Relative 7 (*)    All other components within normal limits  URINALYSIS, ROUTINE W REFLEX MICROSCOPIC - Abnormal; Notable for the following:    Color, Urine AMBER (*)    Urobilinogen, UA 2.0 (*)    All other components within normal limits  APTT - Abnormal; Notable for the following:    aPTT 38 (*)    All other components within normal limits  GLUCOSE, CAPILLARY - Abnormal; Notable for the following:    Glucose-Capillary 155 (*)    All other components within normal limits  I-STAT CHEM 8, ED - Abnormal; Notable for the following:    Glucose, Bld 148 (*)    All other components within normal limits  CBC  ETHANOL  URINE RAPID DRUG SCREEN (HOSP  PERFORMED)  VITAMIN B12  TSH  HEMOGLOBIN A1C  LIPID PANEL  I-STAT TROPOININ, ED  Randolm Idol, ED    Imaging Review Dg Chest 2 View  08/10/2013   CLINICAL DATA:  Heart palpitations  EXAM: CHEST  2 VIEW  COMPARISON:  CT 07/05/2011  FINDINGS: Sternotomy wires overlie normal cardiac silhouette. No effusion, infiltrate, or pneumothorax. There is chronic elevation of the right hemidiaphragm with mild associated atelectasis. Degenerative osteophytosis of the thoracic spine.  IMPRESSION: 1. No interval change compared to prior. 2. Mild right basilar atelectasis associated with elevated right hemidiaphragm.   Electronically Signed   By: Suzy Bouchard M.D.   On: 08/10/2013 11:07   Ct Head Wo Contrast  08/10/2013   CLINICAL DATA:  Patient states his face feels funny on the left side. Rapid heartbeat for 2 days.  EXAM: CT HEAD WITHOUT CONTRAST  TECHNIQUE: Contiguous axial images were obtained from the base of the skull through the vertex without intravenous contrast.  COMPARISON:  11/16/2012  FINDINGS: There is no evidence of mass effect, midline shift, or extra-axial fluid collections. There is no evidence of a space-occupying lesion or intracranial hemorrhage. There is no evidence of a cortical-based area of acute infarction. There is an old right subinsular lacunar infarct. There is generalized cerebral atrophy. There is periventricular white matter low attenuation likely secondary to microangiopathy.  The ventricles and sulci are appropriate for the patient's age. The basal cisterns are patent.  Visualized portions of the orbits are unremarkable. The visualized portions of the paranasal sinuses and mastoid air cells are unremarkable.  The osseous structures are unremarkable.  IMPRESSION: No acute intracranial pathology.   Electronically Signed   By: Kathreen Devoid   On: 08/10/2013 10:22   Mr Brain Wo Contrast  08/10/2013   CLINICAL DATA:  Ataxia left-sided weakness.  EXAM: MRI HEAD WITHOUT CONTRAST   TECHNIQUE: Multiplanar, multiecho pulse sequences of the brain and surrounding structures were obtained without intravenous contrast.  COMPARISON:  CT head without contrast 08/10/2013. MRI brain 12/30/2009.  FINDINGS: A remote hemorrhagic infarct is evident within the posterior right insula and basal ganglia. Asymmetric periventricular white matter changes are present on the right. Additional remote  lacunar infarcts are present within the basal ganglia bilaterally without change.  No acute cortical infarct, hemorrhage, or mass lesion is present ventricles are of normal size. No significant extra-axial fluid collection is present.  Flow is present in the major intracranial arteries. The patient is status post bilateral lens replacements. Mild mucosal thickening is present throughout the anterior ethmoid air cells. Asymmetric mucosal disease is present in the left sphenoid sinus. The mastoid air cells are clear.  IMPRESSION: 1. No acute intracranial abnormality. 2. Age advanced atrophy and diffuse white matter disease, asymmetric on the right. This likely reflects the sequelae of chronic microvascular ischemia. 3. Remote hemorrhagic infarct of the posterior right insular cortex and basal ganglia. 4. Additional nonhemorrhagic remote lacunar infarcts of the basal ganglia bilaterally.   Electronically Signed   By: Lawrence Santiago M.D.   On: 08/10/2013 14:09     EKG Interpretation   Date/Time:  Wednesday Aug 10 2013 09:22:25 EDT Ventricular Rate:  71 PR Interval:  150 QRS Duration: 92 QT Interval:  406 QTC Calculation: 441 R Axis:   -41 Text Interpretation:  Normal sinus rhythm Left axis deviation Left  ventricular hypertrophy Abnormal ECG No significant change was found  Confirmed by [place] ([id number]) on 08/10/2013 9:50:37 AM      MDM   Final diagnoses:  Left-sided weakness  Ataxia   Patient with three-day history of worsening left-sided weakness with palpitations, near syncope,  difficulty walking. Denies any chest pain. EKG without acute change.  Code stroke not activated this patient last seen normal 3 days ago.  CT head without acute abnormality.  EKG sinus.  INR 2.5  Concern for new possible infarct despite coumadin therapeutic. Admission dw Dr. Tyrell Antonio and MRI ordered.     Ezequiel Essex, MD 08/10/13 [phone]

## 2013-08-10 NOTE — H&P (Signed)
[facility] History and Physical  [name] INO:[id number] DOB: 1945-05-03 DOA: 08/10/2013  Referring physician: Dr [name].  PCP: [name]., NP   Chief Complaint: worsening left side numbness, left side face congestions.   HPI: [name] is a 68 y.o. male with prior history of Stroke, CAD, history of A fib on coumadin who presents to the ED complaining of worsening left side numbness and tingling for last 3 days. He also notice gait problems and balance problems. He also relates left side face fullness. He denies chest pain, dyspnea.    Review of Systems:  Negative except as per HPI.   Past Medical History  Diagnosis Date  . GERD (gastroesophageal reflux disease)   . Gout   . Hyperlipidemia   . Hypertension   . CVA (cerebral infarction)     left hemiparesis  . Chronic pain     left sided-[name]  . CAD (coronary artery disease)     s/p CABG, s/p DES to LCX  January 2011  . OSA on CPAP   . Sleep apnea   . History of colonoscopy   . Stroke   . Dysrhythmia     hx of atrial fibrilation with cardioversion   Past Surgical History  Procedure Laterality Date  . Coronary artery bypass graft      stent  . Esophagogastroduodenoscopy  03-25-2005  . Nasal septum surgery    . Percutaneous placement intravascular stent cervical carotid artery      03-2009; using a drug-eluting platform of the circumflex cornoray artery with a 3.0 x 18 [employer] Promus drug-eluting platform post dilated to 3.75 with a noncompliant balloon.  . Cataract extraction  05/2010    left eye  . Cataract extraction  04/2010    right eye  . Cardioversion  06/20/2011    Procedure: CARDIOVERSION;  Surgeon: [name] Dresser, MD;  Location: [facility] ENDOSCOPY;  Service: Cardiovascular;  Laterality: N/A;  . Tee without cardioversion  06/20/2011    Procedure: TRANSESOPHAGEAL ECHOCARDIOGRAM (TEE);  Surgeon: [name] Dresser, MD;  Location: [place];  Service: Cardiovascular;  Laterality:  N/A;   Social History:  reports that he has never smoked. He has never used smokeless tobacco. He reports that he does not drink alcohol or use illicit drugs.  No Known Allergies  Family History  Problem Relation Age of Onset  . Lung cancer Father     deceased  . Stroke Mother     deceased-MINISTROKES     Prior to Admission medications   Medication Sig Start Date End Date Taking? Authorizing Provider  allopurinol (ZYLOPRIM) 100 MG tablet Take 2 tablets (200 mg total) by mouth daily. 07/08/13  Yes [name] Alar, NP  aspirin 81 MG tablet Take 81 mg by mouth daily.    Yes Historical Provider, MD  benazepril (LOTENSIN) 10 MG tablet Take 10 mg by mouth daily.   Yes Historical Provider, MD  diltiazem (CARDIZEM CD) 180 MG 24 hr capsule Take 180 mg by mouth daily.   Yes Historical Provider, MD  furosemide (LASIX) 40 MG tablet Take 40 mg by mouth every other day.   Yes Historical Provider, MD  levothyroxine (SYNTHROID, LEVOTHROID) 112 MCG tablet Take 112 mcg by mouth daily before breakfast.   Yes Historical Provider, MD  metFORMIN (GLUCOPHAGE) 500 MG tablet Take 500 mg by mouth daily with breakfast.  12/11/11  Yes [name] Logan, MD  nitroGLYCERIN (NITROSTAT) 0.4 MG SL tablet Place 0.4 mg under the tongue every 5 (five)  minutes as needed for chest pain. Up to 3 doses   Yes Historical Provider, MD  pantoprazole (PROTONIX) 40 MG tablet Take 40 mg by mouth daily.   Yes Historical Provider, MD  rosuvastatin (CRESTOR) 40 MG tablet Take 40 mg by mouth daily.   Yes Historical Provider, MD  traMADol (ULTRAM) 50 MG tablet Take 50 mg by mouth 2 (two) times daily.   Yes Historical Provider, MD  traMADol (ULTRAM-ER) 200 MG 24 hr tablet Take 200 mg by mouth daily.   Yes Historical Provider, MD  warfarin (COUMADIN) 2.5 MG tablet Take 1.25-2.5 mg by mouth daily. Take 1.25mg  by mouth on Tues and Sat. Take 2.5mg  by mouth all other days   Yes Historical Provider, MD   Physical Exam: Filed Vitals:   08/10/13  1445  BP:   Pulse: 70  Temp: 97.3 F (36.3 C)  Resp: 18    BP 138/91  Pulse 70  Temp(Src) 97.3 F (36.3 C) (Oral)  Resp 18  Ht 6\' 3"  (1.905 m)  Wt 117.935 kg (260 lb)  BMI 32.50 kg/m2  SpO2 97%  General:  Appears calm and comfortable Eyes: PERRL, normal lids, irises & conjunctiva ENT: grossly normal hearing, lips & tongue Neck: no LAD, masses or thyromegaly Cardiovascular: RRR, no m/r/g. No LE edema. Respiratory: CTA bilaterally, no w/r/r. Normal respiratory effort. Abdomen: soft, ntnd Skin: no rash or induration seen on limited exam Musculoskeletal: grossly normal tone BUE/BLE Neurologic: grossly non-focal. Dysarthric at baseline, motor strength 5/5           Labs on Admission:  Basic Metabolic Panel:  Recent Labs Lab 08/10/13 0945 08/10/13 0957  NA 141 143  K 3.9 3.8  CL 102 101  CO2 27  --   GLUCOSE 147* 148*  BUN 18 18  CREATININE 1.02 1.10  CALCIUM 9.6  --    Liver Function Tests:  Recent Labs Lab 08/10/13 0945  AST 35  ALT 37  ALKPHOS 54  BILITOT 0.4  PROT 7.4  ALBUMIN 3.7   No results found for this basename: LIPASE, AMYLASE,  in the last 168 hours No results found for this basename: AMMONIA,  in the last 168 hours CBC:  Recent Labs Lab 08/10/13 0945 08/10/13 0957  WBC 6.1  --   NEUTROABS 4.1  --   HGB 13.2 14.6  HCT 40.2 43.0  MCV 85.9  --   PLT 172  --    Cardiac Enzymes: No results found for this basename: CKTOTAL, CKMB, CKMBINDEX, TROPONINI,  in the last 168 hours  BNP (last 3 results) No results found for this basename: PROBNP,  in the last 8760 hours CBG: No results found for this basename: GLUCAP,  in the last 168 hours  Radiological Exams on Admission: Dg Chest 2 View  08/10/2013   CLINICAL DATA:  Heart palpitations  EXAM: CHEST  2 VIEW  COMPARISON:  CT 07/05/2011  FINDINGS: Sternotomy wires overlie normal cardiac silhouette. No effusion, infiltrate, or pneumothorax. There is chronic elevation of the right  hemidiaphragm with mild associated atelectasis. Degenerative osteophytosis of the thoracic spine.  IMPRESSION: 1. No interval change compared to prior. 2. Mild right basilar atelectasis associated with elevated right hemidiaphragm.   Electronically Signed   By: [name] M.D.   On: 08/10/2013 11:07   Ct Head Wo Contrast  08/10/2013   CLINICAL DATA:  Patient states his face feels funny on the left side. Rapid heartbeat for 2 days.  EXAM: CT HEAD WITHOUT CONTRAST  TECHNIQUE: Contiguous axial images were obtained from the base of the skull through the vertex without intravenous contrast.  COMPARISON:  11/16/2012  FINDINGS: There is no evidence of mass effect, midline shift, or extra-axial fluid collections. There is no evidence of a space-occupying lesion or intracranial hemorrhage. There is no evidence of a cortical-based area of acute infarction. There is an old right subinsular lacunar infarct. There is generalized cerebral atrophy. There is periventricular white matter low attenuation likely secondary to microangiopathy.  The ventricles and sulci are appropriate for the patient's age. The basal cisterns are patent.  Visualized portions of the orbits are unremarkable. The visualized portions of the paranasal sinuses and mastoid air cells are unremarkable.  The osseous structures are unremarkable.  IMPRESSION: No acute intracranial pathology.   Electronically Signed   By: Kathreen Devoid   On: 08/10/2013 10:22   Mr Brain Wo Contrast  08/10/2013   CLINICAL DATA:  Ataxia left-sided weakness.  EXAM: MRI HEAD WITHOUT CONTRAST  TECHNIQUE: Multiplanar, multiecho pulse sequences of the brain and surrounding structures were obtained without intravenous contrast.  COMPARISON:  CT head without contrast 08/10/2013. MRI brain 12/30/2009.  FINDINGS: A remote hemorrhagic infarct is evident within the posterior right insula and basal ganglia. Asymmetric periventricular white matter changes are present on the right.  Additional remote lacunar infarcts are present within the basal ganglia bilaterally without change.  No acute cortical infarct, hemorrhage, or mass lesion is present ventricles are of normal size. No significant extra-axial fluid collection is present.  Flow is present in the major intracranial arteries. The patient is status post bilateral lens replacements. Mild mucosal thickening is present throughout the anterior ethmoid air cells. Asymmetric mucosal disease is present in the left sphenoid sinus. The mastoid air cells are clear.  IMPRESSION: 1. No acute intracranial abnormality. 2. Age advanced atrophy and diffuse white matter disease, asymmetric on the right. This likely reflects the sequelae of chronic microvascular ischemia. 3. Remote hemorrhagic infarct of the posterior right insular cortex and basal ganglia. 4. Additional nonhemorrhagic remote lacunar infarcts of the basal ganglia bilaterally.   Electronically Signed   By: Lawrence Santiago M.D.   On: 08/10/2013 14:09    EKG: Independently reviewed. Sinus left axis deviation.   Assessment/Plan Active Problems:   DIABETES MELLITUS, TYPE II, BORDERLINE   Atrial fibrillation   Left-sided weakness   TIA (transient ischemic attack)   Ataxia  1-Worsening left side numbness, ataxia;  Admit to telemetry. Neuro consulted.  MRI negative for acute stroke, show old hemorrhagic stroke. Discussed with neuro , ok to continue with coumadin.  Will do work up for TIA. ECHO, Carotid Doppler.  HB-A1c and lipid panel.  Check B-12 level, TSH.Marland Kitchen   2-Diabetes; hold metformin while inpatient. SSI.   3-History of A fib; continue with Cardizem and and coumadin per pharmacy.   4-Hypothyroidism; continue with synthroid.   Code Status: full code.  Family Communication: Care discussed with patient.  Disposition Plan: expect less than 2 days in the hospital.   Time spent: 75 minutes.   [facility] Pager [phone]

## 2013-08-10 NOTE — ED Notes (Addendum)
Pt c/o irregular HR x 2 days; hx of same. Reports increased shortness of breath on exertion. Denies chest pain at this time. Also c/o of L arm pain; hx of stoke in 1996. Sts "arm pain is more pronounced than normal". LSN x 2 days ago

## 2013-08-11 DIAGNOSIS — E538 Deficiency of other specified B group vitamins: Secondary | ICD-10-CM

## 2013-08-11 DIAGNOSIS — I4891 Unspecified atrial fibrillation: Secondary | ICD-10-CM | POA: Diagnosis not present

## 2013-08-11 DIAGNOSIS — R209 Unspecified disturbances of skin sensation: Secondary | ICD-10-CM

## 2013-08-11 DIAGNOSIS — R279 Unspecified lack of coordination: Secondary | ICD-10-CM | POA: Diagnosis not present

## 2013-08-11 DIAGNOSIS — M6281 Muscle weakness (generalized): Secondary | ICD-10-CM | POA: Diagnosis not present

## 2013-08-11 HISTORY — DX: Deficiency of other specified B group vitamins: E53.8

## 2013-08-11 LAB — LIPID PANEL
Cholesterol: 105 mg/dL (ref 0–200)
HDL: 40 mg/dL (ref >39)
LDL Cholesterol: 43 mg/dL (ref 0–99)
Total CHOL/HDL Ratio: 2.6 RATIO
Triglycerides: 111 mg/dL (ref <150)
VLDL: 22 mg/dL (ref 0–40)

## 2013-08-11 LAB — GLUCOSE, CAPILLARY
Glucose-Capillary: 106 mg/dL — ABNORMAL HIGH (ref 70–99)
Glucose-Capillary: 122 mg/dL — ABNORMAL HIGH (ref 70–99)

## 2013-08-11 LAB — HEMOGLOBIN A1C
HEMOGLOBIN A1C: 6.5 % — AB (ref <5.7)
MEAN PLASMA GLUCOSE: 140 mg/dL — AB (ref <117)

## 2013-08-11 LAB — PROTIME-INR
INR: 2.44 — AB (ref 0.00–1.49)
PROTHROMBIN TIME: 25.7 s — AB (ref 11.6–15.2)

## 2013-08-11 MED ORDER — VITAMIN B-12 1000 MCG PO TABS
1000.0000 ug | ORAL_TABLET | Freq: Every day | ORAL | Status: DC
  Filled 2013-08-11: qty 1

## 2013-08-11 MED ORDER — CYANOCOBALAMIN 1000 MCG PO TABS: 1000.0000 ug | ORAL_TABLET | Freq: Every day | ORAL | Status: DC

## 2013-08-11 MED ORDER — LORATADINE 10 MG PO TABS: 10.0000 mg | ORAL_TABLET | Freq: Every day | ORAL | Status: DC

## 2013-08-11 MED ORDER — FLUTICASONE PROPIONATE 50 MCG/ACT NA SUSP: 2.0000 | Freq: Every day | NASAL | Status: DC

## 2013-08-11 NOTE — Progress Notes (Signed)
ANTICOAGULATION CONSULT NOTE - Follow Up Consult  Pharmacy Consult:  Coumadin Indication: atrial fibrillation  No Known Allergies  Patient Measurements: Height: 6\' 3"  (190.5 cm) Weight: 260 lb (117.935 kg) IBW/kg (Calculated) : 84.5  Vital Signs: Temp: 97.6 F (36.4 C) (05/28 0358) Temp src: Oral (05/28 0358) BP: 127/82 mmHg (05/28 0358) Pulse Rate: 64 (05/28 0358)  Labs:  Recent Labs  08/10/13 0930 08/10/13 0945 08/10/13 0957 08/11/13 0955  HGB  --  13.2 14.6  --   HCT  --  40.2 43.0  --   PLT  --  172  --   --   APTT 38*  --   --   --   LABPROT 26.4*  --   --  25.7*  INR 2.53*  --   --  2.44*  CREATININE  --  1.02 1.10  --     Estimated Creatinine Clearance: 86.5 ml/min (by C-G formula based on Cr of 1.1).     Assessment: 57 YOM admitted with complaint of worsening left-side numbness and facial congestion.  MRI negative for acute infarct.  Pharmacy consulted to manage Coumadin for history of Afib.  INR therapeutic on home regiment.  No bleeding reported.   Goal of Therapy:  INR 2-3    Plan:  - Coumadin 2.5 mg PO daily except 1.25 mg on Tue/Sat - Daily PT / INR for now    Jabin Tapp D. Mina Marble, PharmD, BCPS Pager:  [phone] 08/11/2013, 10:52 AM

## 2013-08-11 NOTE — Care Management (Signed)
1655 08-11-13 CM did speak to pt in reference to [place] and pt is refusing services at this time. CM did try to call wife and pt states no need to do so. MD and RN aware no services at d/c. No further needs from CM at this time. Ocie Cornfield [place], RN,BSN [phone]

## 2013-08-11 NOTE — Discharge Instructions (Signed)
Allergies °Allergies may happen from anything your body is sensitive to. This may be food, medicines, pollens, chemicals, and nearly anything around you in everyday life that produces allergens. An allergen is anything that causes an allergy producing substance. Heredity is often a factor in causing these problems. This means you may have some of the same allergies as your parents. °Food allergies happen in all age groups. Food allergies are some of the most severe and life threatening. Some common food allergies are cow's milk, seafood, eggs, nuts, wheat, and soybeans. °SYMPTOMS  °· Swelling around the mouth. °· An itchy red rash or hives. °· Vomiting or diarrhea. °· Difficulty breathing. °SEVERE ALLERGIC REACTIONS ARE LIFE-THREATENING. °This reaction is called anaphylaxis. It can cause the mouth and throat to swell and cause difficulty with breathing and swallowing. In severe reactions only a trace amount of food (for example, peanut oil in a salad) may cause death within seconds. °Seasonal allergies occur in all age groups. These are seasonal because they usually occur during the same season every year. They may be a reaction to molds, grass pollens, or tree pollens. Other causes of problems are house dust mite allergens, pet dander, and mold spores. The symptoms often consist of nasal congestion, a runny itchy nose associated with sneezing, and tearing itchy eyes. There is often an associated itching of the mouth and ears. The problems happen when you come in contact with pollens and other allergens. Allergens are the particles in the air that the body reacts to with an allergic reaction. This causes you to release allergic antibodies. Through a chain of events, these eventually cause you to release histamine into the blood stream. Although it is meant to be protective to the body, it is this release that causes your discomfort. This is why you were given anti-histamines to feel better.  If you are unable to  pinpoint the offending allergen, it may be determined by skin or blood testing. Allergies cannot be cured but can be controlled with medicine. °Hay fever is a collection of all or some of the seasonal allergy problems. It may often be treated with simple over-the-counter medicine such as diphenhydramine. Take medicine as directed. Do not drink alcohol or drive while taking this medicine. Check with your caregiver or package insert for child dosages. °If these medicines are not effective, there are many new medicines your caregiver can prescribe. Stronger medicine such as nasal spray, eye drops, and corticosteroids may be used if the first things you try do not work well. Other treatments such as immunotherapy or desensitizing injections can be used if all else fails. Follow up with your caregiver if problems continue. These seasonal allergies are usually not life threatening. They are generally more of a nuisance that can often be handled using medicine. °HOME CARE INSTRUCTIONS  °· If unsure what causes a reaction, keep a diary of foods eaten and symptoms that follow. Avoid foods that cause reactions. °· If hives or rash are present: °· Take medicine as directed. °· You may use an over-the-counter antihistamine (diphenhydramine) for hives and itching as needed. °· Apply cold compresses (cloths) to the skin or take baths in cool water. Avoid hot baths or showers. Heat will make a rash and itching worse. °· If you are severely allergic: °· Following a treatment for a severe reaction, hospitalization is often required for closer follow-up. °· Wear a medic-alert bracelet or necklace stating the allergy. °· You and your family must learn how to give adrenaline or use   an anaphylaxis kit. °· If you have had a severe reaction, always carry your anaphylaxis kit or EpiPen® with you. Use this medicine as directed by your caregiver if a severe reaction is occurring. Failure to do so could have a fatal outcome. °SEEK MEDICAL  CARE IF: °· You suspect a food allergy. Symptoms generally happen within 30 minutes of eating a food. °· Your symptoms have not gone away within 2 days or are getting worse. °· You develop new symptoms. °· You want to retest yourself or your child with a food or drink you think causes an allergic reaction. Never do this if an anaphylactic reaction to that food or drink has happened before. Only do this under the care of a caregiver. °SEEK IMMEDIATE MEDICAL CARE IF:  °· You have difficulty breathing, are wheezing, or have a tight feeling in your chest or throat. °· You have a swollen mouth, or you have hives, swelling, or itching all over your body. °· You have had a severe reaction that has responded to your anaphylaxis kit or an EpiPen®. These reactions may return when the medicine has worn off. These reactions should be considered life threatening. °MAKE SURE YOU:  °· Understand these instructions. °· Will watch your condition. °· Will get help right away if you are not doing well or get worse. °Document Released: 05/27/2002 Document Revised: 06/28/2012 Document Reviewed: 11/01/2007 °[employer]® Patient Information ©2014 [employer]. ° °

## 2013-08-11 NOTE — Progress Notes (Signed)
SLP Cancellation Note  Patient Details Name: [name] MRN: [id number] DOB: 12/04/1945   Cancelled treatment:       Reason Eval/Treat Not Completed: SLP screened, no needs identified, will sign off   Katherene Ponto Sandor Arboleda 08/11/2013, 8:22 AM

## 2013-08-11 NOTE — Progress Notes (Addendum)
VASCULAR LAB PRELIMINARY  PRELIMINARY  PRELIMINARY  PRELIMINARY  Carotid duplex  completed.    Preliminary report:  Bilateral:  1-39% ICA stenosis.  Bilateral:  Vertebral artery flow is antegrade.     Nani Ravens, RVT 08/11/2013, 11:11 AM

## 2013-08-11 NOTE — Progress Notes (Signed)
UR completed 

## 2013-08-11 NOTE — Evaluation (Signed)
Physical Therapy Evaluation Patient Details Name: [name] MRN: [id number] DOB: 1945-10-10 Today's Date: 08/11/2013   History of Present Illness  68 yo male admitted with left sided weakness, palpitations, near syncope, sinus congestion. 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   Clinical Impression  On eval, pt required Min assist for mobility-able to ambulate ~115 with use of hallway handrail. Will plan to continue to assess gait and need for assistive device. Recommend HHPT.     Follow Up Recommendations Home health PT;Supervision for mobility/OOB    Equipment Recommendations  None recommended by PT (pt states he has walker and cane available at home)    Recommendations for Other Services OT consult     Precautions / Restrictions Precautions Precautions: Fall Restrictions Weight Bearing Restrictions: No      Mobility  Bed Mobility Overal bed mobility: Modified Independent                Transfers Overall transfer level: Needs assistance   Transfers: Sit to/from Stand Sit to Stand: Supervision            Ambulation/Gait Ambulation/Gait assistance: Min assist Ambulation Distance (Feet): 115 Feet Assistive device: 1 person hand held assist (handrail in hallway) Gait Pattern/deviations: Step-through pattern;Decreased stride length;Drifts right/left;Staggering right     General Gait Details: unsteady. decreased hip/knee flexion noted during gait. assist to stabilize throughout ambulation. Pt able to take a fair amount of steps without UE support as well.   Stairs            Wheelchair Mobility    Modified Rankin (Stroke Patients Only)       Balance Overall balance assessment: Needs assistance         Standing balance support: No upper extremity supported;During functional activity;Single extremity supported Standing balance-Leahy Scale: Fair                               Pertinent  Vitals/Pain HA, sinus congestion-5/10    Home Living Family/patient expects to be discharged to:: Private residence Living Arrangements: Spouse/significant other   Type of Home: House Home Access: Stairs to enter Entrance Stairs-Rails: Right Entrance Stairs-Number of Steps: 2-3 Home Layout: Two level;Able to live on main level with bedroom/bathroom Home Equipment: Gilford Rile - 2 wheels;Cane - single point      Prior Function Level of Independence: Independent               Hand Dominance        Extremity/Trunk Assessment   Upper Extremity Assessment: Defer to OT evaluation           Lower Extremity Assessment: RLE deficits/detail;LLE deficits/detail RLE Deficits / Details: WFL LLE Deficits / Details: knee ext, knee flex, hip flex-all at least 3+/5  Cervical / Trunk Assessment: Normal  Communication   Communication: No difficulties  Cognition Arousal/Alertness: Awake/alert Behavior During Therapy: WFL for tasks assessed/performed Overall Cognitive Status: Within Functional Limits for tasks assessed                      General Comments      Exercises        Assessment/Plan    PT Assessment Patient needs continued PT services  PT Diagnosis Difficulty walking;Abnormality of gait;Generalized weakness   PT Problem List Decreased strength;Decreased range of motion;Decreased activity tolerance;Decreased balance;Decreased mobility;Pain;Decreased knowledge of use of DME  PT Treatment Interventions DME instruction;Gait  training;Stair training;Functional mobility training;Therapeutic activities;Therapeutic exercise;Patient/family education;Balance training   PT Goals (Current goals can be found in the Care Plan section) Acute Rehab PT Goals Patient Stated Goal: return to PLOF PT Goal Formulation: With patient Time For Goal Achievement: 08/25/13 Potential to Achieve Goals: Good    Frequency Min 3X/week   Barriers to discharge        Co-evaluation                End of Session Equipment Utilized During Treatment: Gait belt Activity Tolerance: Patient tolerated treatment well Patient left: in bed;with call bell/phone within reach      Functional Assessment Tool Used: clinical judgement Functional Limitation: Mobility: Walking and moving around Mobility: Walking and Moving Around Current Status ([id number]): At least 20 percent but less than 40 percent impaired, limited or restricted Mobility: Walking and Moving Around Goal Status [phone]): At least 1 percent but less than 20 percent impaired, limited or restricted    Time: 0901-0921 PT Time Calculation (min): 20 min   Charges:   PT Evaluation $Initial PT Evaluation Tier I: 1 Procedure PT Treatments $Gait Training: 8-22 mins   PT G Codes:   Functional Assessment Tool Used: clinical judgement Functional Limitation: Mobility: Walking and moving around    [employer], MPT Pager: [phone]

## 2013-08-11 NOTE — Discharge Summary (Signed)
Physician Discharge Summary  [name] PXT:[id number] DOB: [birth year] DOA: 08/10/2013  PCP: [name]., NP  Admit date: 08/10/2013 Discharge date: 08/11/2013  Recommendations for Outpatient Follow-up:  1. Pt will need to follow up with PCP in 2 weeks post discharge 2. Please obtain BMP to evaluate electrolytes and kidney function 3. Please also check CBC to evaluate Hg and Hct levels 4. Check serum B12 level in 2-3 weeks   Discharge Diagnoses:  Active Problems:   DIABETES MELLITUS, TYPE II, BORDERLINE   Atrial fibrillation   Left-sided weakness   TIA (transient ischemic attack)   Ataxia   B12 deficiency   Sensory disturbance  sensory disturbance/ataxia -MRI brain negative -May be related to the patient's low B12 levels -Supplement B12--1000 mcg daily for the next 14 days po, Then 1000 mcg once per week for one month -As the patient is on warfarin, hesitate to use intramuscular B12 injections due to risk of hematoma development -If no improvement, may need neurologic followup in the outpatient setting for possible EMG and evaluation for other causes of neuropathy -Carotid duplex negative bilateral -Hemoglobin A1c 6.5 -TSH 0.79 -Serum B12--263 -Urinalysis negative pyuria -Urine drug screen negative -I spoke with Dr. Wallie Char on day of d/c, and he cleared pt for discharge  -continue aspirin 81 mg daily-  diabetes mellitus type 2  -NovoLog sliding scale while the patient was in the hospital  -Restart metformin as an outpatient  Paroxysmal atrial fibrillation -Presently in sinus rhythm -Continue warfarin and diltiazem -INR 2.53 on the day of admission Hypothyroidism -Continue Synthroid -TSH 0.879 Hyperlipidemia  -LDL 43  -Continue Lipitor  Chronic sinusitis/allergic rhinitis  -No indication for antibiotics presently  -Start Flonase and Claritin  Discharge Condition: Stable  Disposition:  home  Diet: Cardiac Wt Readings from Last 3  Encounters:  08/10/13 117.935 kg (260 lb)  07/05/13 120.385 kg (265 lb 6.4 oz)  05/20/13 120.657 kg (266 lb)    History of present illness:  68 y.o. 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 Patient states for over three weeks he has had a sinus infections that has progressively become worse. Over the last three days he has noted worsening decreased sensation of his left face and arm, slight weakness of left leg and light headed ness when he stands. At baseline he has decreased sensation of left face, arm and leg and mild weakness of left arm and leg from previous stroke. His main complaint at this time is the sinus pressure behind his eyes. CT of the brain was negative for any acute infarct. His sinuses were unremarkable. MRI of the brain was negative for acute infarction. It showed a remote hemorrhagic infarct. There was also some minimal left sphenoid mucosal disease without any mastoid fluid. The patient was afebrile and hemodynamically stable.    Consultants: neurology  Discharge Exam: Filed Vitals:   08/11/13 1200  BP: 126/76  Pulse: 63  Temp: 97.6 F (36.4 C)  Resp: 18   Filed Vitals:   08/11/13 0013 08/11/13 0358 08/11/13 0733 08/11/13 1200  BP: 127/66 127/82 122/78 126/76  Pulse: 74 64 61 63  Temp: 97.6 F (36.4 C) 97.6 F (36.4 C) 97.8 F (36.6 C) 97.6 F (36.4 C)  TempSrc: Oral Oral Oral Oral  Resp: 18 20 18 18   Height:      Weight:  SpO2: 97% 100% 97% 99%   General: A&O x 3, NAD, pleasant, cooperative Cardiovascular: RRR, no rub, no gallop, no S3 Respiratory: CTAB, no wheeze, no rhonchi Abdomen:soft, nontender, nondistended, positive bowel sounds Extremities: No edema, No lymphangitis, no petechiae  Discharge Instructions      Discharge Instructions   Diet - low sodium heart healthy    Complete by:   As directed      Increase activity slowly    Complete by:  As directed             Medication List         allopurinol 100 MG tablet  Commonly known as:  ZYLOPRIM  Take 2 tablets (200 mg total) by mouth daily.     aspirin 81 MG tablet  Take 81 mg by mouth daily.     benazepril 10 MG tablet  Commonly known as:  LOTENSIN  Take 10 mg by mouth daily.     cyanocobalamin 1000 MCG tablet  Take 1 tablet (1,000 mcg total) by mouth daily.     diltiazem 180 MG 24 hr capsule  Commonly known as:  CARDIZEM CD  Take 180 mg by mouth daily.     fluticasone 50 MCG/ACT nasal spray  Commonly known as:  FLONASE  Place 2 sprays into both nostrils daily.     furosemide 40 MG tablet  Commonly known as:  LASIX  Take 40 mg by mouth every other day.     levothyroxine 112 MCG tablet  Commonly known as:  SYNTHROID, LEVOTHROID  Take 112 mcg by mouth daily before breakfast.     loratadine 10 MG tablet  Commonly known as:  CLARITIN  Take 1 tablet (10 mg total) by mouth daily.     metFORMIN 500 MG tablet  Commonly known as:  GLUCOPHAGE  Take 500 mg by mouth daily with breakfast.     nitroGLYCERIN 0.4 MG SL tablet  Commonly known as:  NITROSTAT  Place 0.4 mg under the tongue every 5 (five) minutes as needed for chest pain. Up to 3 doses     pantoprazole 40 MG tablet  Commonly known as:  PROTONIX  Take 40 mg by mouth daily.     rosuvastatin 40 MG tablet  Commonly known as:  CRESTOR  Take 40 mg by mouth daily.     traMADol 200 MG 24 hr tablet  Commonly known as:  ULTRAM-ER  Take 200 mg by mouth daily.     traMADol 50 MG tablet  Commonly known as:  ULTRAM  Take 50 mg by mouth 2 (two) times daily.     warfarin 2.5 MG tablet  Commonly known as:  COUMADIN  Take 1.25-2.5 mg by mouth daily. Take 1.25mg  by mouth on Tues and Sat. Take 2.5mg  by mouth all other days         The results of significant diagnostics from this hospitalization (including imaging, microbiology, ancillary and  laboratory) are listed below for reference.    Significant Diagnostic Studies: Dg Chest 2 View  08/10/2013   CLINICAL DATA:  Heart palpitations  EXAM: CHEST  2 VIEW  COMPARISON:  CT 07/05/2011  FINDINGS: Sternotomy wires overlie normal cardiac silhouette. No effusion, infiltrate, or pneumothorax. There is chronic elevation of the right hemidiaphragm with mild associated atelectasis. Degenerative osteophytosis of the thoracic spine.  IMPRESSION: 1. No interval change compared to prior. 2. Mild right basilar atelectasis associated with elevated right hemidiaphragm.   Electronically Signed   By: Helane Gunther.D.  On: 08/10/2013 11:07   Ct Head Wo Contrast  08/10/2013   CLINICAL DATA:  Patient states his face feels funny on the left side. Rapid heartbeat for 2 days.  EXAM: CT HEAD WITHOUT CONTRAST  TECHNIQUE: Contiguous axial images were obtained from the base of the skull through the vertex without intravenous contrast.  COMPARISON:  11/16/2012  FINDINGS: There is no evidence of mass effect, midline shift, or extra-axial fluid collections. There is no evidence of a space-occupying lesion or intracranial hemorrhage. There is no evidence of a cortical-based area of acute infarction. There is an old right subinsular lacunar infarct. There is generalized cerebral atrophy. There is periventricular white matter low attenuation likely secondary to microangiopathy.  The ventricles and sulci are appropriate for the patient's age. The basal cisterns are patent.  Visualized portions of the orbits are unremarkable. The visualized portions of the paranasal sinuses and mastoid air cells are unremarkable.  The osseous structures are unremarkable.  IMPRESSION: No acute intracranial pathology.   Electronically Signed   By: Kathreen Devoid   On: 08/10/2013 10:22   Mr Brain Wo Contrast  08/10/2013   CLINICAL DATA:  Ataxia left-sided weakness.  EXAM: MRI HEAD WITHOUT CONTRAST  TECHNIQUE: Multiplanar, multiecho pulse sequences  of the brain and surrounding structures were obtained without intravenous contrast.  COMPARISON:  CT head without contrast 08/10/2013. MRI brain 12/30/2009.  FINDINGS: A remote hemorrhagic infarct is evident within the posterior right insula and basal ganglia. Asymmetric periventricular white matter changes are present on the right. Additional remote lacunar infarcts are present within the basal ganglia bilaterally without change.  No acute cortical infarct, hemorrhage, or mass lesion is present ventricles are of normal size. No significant extra-axial fluid collection is present.  Flow is present in the major intracranial arteries. The patient is status post bilateral lens replacements. Mild mucosal thickening is present throughout the anterior ethmoid air cells. Asymmetric mucosal disease is present in the left sphenoid sinus. The mastoid air cells are clear.  IMPRESSION: 1. No acute intracranial abnormality. 2. Age advanced atrophy and diffuse white matter disease, asymmetric on the right. This likely reflects the sequelae of chronic microvascular ischemia. 3. Remote hemorrhagic infarct of the posterior right insular cortex and basal ganglia. 4. Additional nonhemorrhagic remote lacunar infarcts of the basal ganglia bilaterally.   Electronically Signed   By: Lawrence Santiago M.D.   On: 08/10/2013 14:09     Microbiology: No results found for this or any previous visit (from the past 240 hour(s)).   Labs: Basic Metabolic Panel:  Recent Labs Lab 08/10/13 0945 08/10/13 0957  NA 141 143  K 3.9 3.8  CL 102 101  CO2 27  --   GLUCOSE 147* 148*  BUN 18 18  CREATININE 1.02 1.10  CALCIUM 9.6  --    Liver Function Tests:  Recent Labs Lab 08/10/13 0945  AST 35  ALT 37  ALKPHOS 54  BILITOT 0.4  PROT 7.4  ALBUMIN 3.7   No results found for this basename: LIPASE, AMYLASE,  in the last 168 hours No results found for this basename: AMMONIA,  in the last 168 hours CBC:  Recent Labs Lab  08/10/13 0945 08/10/13 0957  WBC 6.1  --   NEUTROABS 4.1  --   HGB 13.2 14.6  HCT 40.2 43.0  MCV 85.9  --   PLT 172  --    Cardiac Enzymes: No results found for this basename: CKTOTAL, CKMB, CKMBINDEX, TROPONINI,  in the last 168 hours  BNP: No components found with this basename: POCBNP,  CBG:  Recent Labs Lab 08/10/13 1720 08/10/13 2207 08/11/13 0740 08/11/13 1132  GLUCAP 155* 100* 106* 122*    Time coordinating discharge:  Greater than 30 minutes  Signed:  Orson Eva, DO [facility] Pager: [phone] 08/11/2013, 4:50 PM

## 2013-08-12 ENCOUNTER — Telehealth: Payer: Self-pay | Admitting: Family

## 2013-08-12 NOTE — Telephone Encounter (Signed)
Try flonase 2 sprays each nostril once daily. Available otc.

## 2013-08-12 NOTE — Telephone Encounter (Signed)
Please contact pt to arrange a 2 week hospital follow up.

## 2013-08-12 NOTE — Telephone Encounter (Signed)
Notified pt and he voices understanding. 

## 2013-08-12 NOTE — Telephone Encounter (Signed)
Appointment scheduled for 08/26/13. Patient states that the hospital physician told him to take clariten for head congestion but patient says that is not working and would like to know what else he should take?

## 2013-08-15 ENCOUNTER — Ambulatory Visit (INDEPENDENT_AMBULATORY_CARE_PROVIDER_SITE_OTHER): Payer: Medicare Other | Admitting: Pharmacist

## 2013-08-15 DIAGNOSIS — I4891 Unspecified atrial fibrillation: Secondary | ICD-10-CM | POA: Diagnosis not present

## 2013-08-15 DIAGNOSIS — Z7901 Long term (current) use of anticoagulants: Secondary | ICD-10-CM

## 2013-08-15 LAB — POCT INR: INR: 2.4

## 2013-08-16 ENCOUNTER — Ambulatory Visit (HOSPITAL_BASED_OUTPATIENT_CLINIC_OR_DEPARTMENT_OTHER): Payer: Medicare Other | Admitting: Physical Medicine & Rehabilitation

## 2013-08-16 ENCOUNTER — Encounter: Payer: Self-pay | Admitting: Physical Medicine & Rehabilitation

## 2013-08-16 ENCOUNTER — Encounter: Payer: Medicare Other | Attending: Physical Medicine & Rehabilitation

## 2013-08-16 VITALS — BP 150/72 | HR 85 | Resp 14 | Ht 75.0 in | Wt 267.0 lb

## 2013-08-16 DIAGNOSIS — R209 Unspecified disturbances of skin sensation: Secondary | ICD-10-CM

## 2013-08-16 DIAGNOSIS — G8110 Spastic hemiplegia affecting unspecified side: Secondary | ICD-10-CM

## 2013-08-16 DIAGNOSIS — I2510 Atherosclerotic heart disease of native coronary artery without angina pectoris: Secondary | ICD-10-CM

## 2013-08-16 DIAGNOSIS — M62838 Other muscle spasm: Secondary | ICD-10-CM | POA: Insufficient documentation

## 2013-08-16 NOTE — Progress Notes (Signed)
Subjective:    Patient ID: [name], male    DOB: 26-Sep-1945, 68 y.o.   MRN: [id number] 4/21 Botox Injection for spasticity using needle EMG guidance and E stim  Dilution: 50 Units/ml  Biceps100  FCR50  FCU0  FDS50  FDP0  Gastrosoleus0  Hamstrings 200  HPI Admitted for sinus pain and poor balance Admitted to [facility] Diagnosed with B12 deficiency MRI without new CVA  Left arm feels numb and heavy Overall feels better than one or 2 weeks ago Taking B12 supplementation  Pain Inventory Average Pain 5 Pain Right Now 5 My pain is intermittent and tingling  In the last 24 hours, has pain interfered with the following? General activity 5 Relation with others 5 Enjoyment of life 5 What TIME of day is your pain at its worst? morning, night Sleep (in general) Poor  Pain is worse with: walking Pain improves with: rest and medication Relief from Meds: 9  Mobility walk without assistance ability to climb steps?  yes do you drive?  yes transfers alone  Function retired  Neuro/Psych numbness  Prior Studies Any changes since last visit?  yes x-rays CT/MRI IMPRESSION: 1. No acute intracranial abnormality. 2. Age advanced atrophy and diffuse white matter disease, asymmetric on the right. This likely reflects the sequelae of chronic microvascular ischemia. 3. Remote hemorrhagic infarct of the posterior right insular cortex and basal ganglia. 4. Additional nonhemorrhagic remote lacunar infarcts of the basal ganglia bilaterally.  Physicians involved in your care Any changes since last visit?  no   Family History  Problem Relation Age of Onset  . Lung cancer Father     deceased  . Stroke Mother     deceased-MINISTROKES   History   Social History  . Marital Status: Married    Spouse Name: [name]    Number of Children: N/A  . Years of Education: N/A   Occupational History  . retired Hydrographic surveyor   Social History Main Topics  .  Smoking status: Never Smoker   . Smokeless tobacco: Never Used  . Alcohol Use: No  . Drug Use: No  . Sexual Activity: Yes   Other Topics Concern  . None   Social History Narrative  . None   Past Surgical History  Procedure Laterality Date  . Coronary artery bypass graft      stent  . Esophagogastroduodenoscopy  03-25-2005  . Nasal septum surgery    . Percutaneous placement intravascular stent cervical carotid artery      03-2009; using a drug-eluting platform of the circumflex cornoray artery with a 3.0 x 18 [employer] Promus drug-eluting platform post dilated to 3.75 with a noncompliant balloon.  . Cataract extraction  05/2010    left eye  . Cataract extraction  04/2010    right eye  . Cardioversion  06/20/2011    Procedure: CARDIOVERSION;  Surgeon: Larey Dresser, MD;  Location: [facility] ENDOSCOPY;  Service: Cardiovascular;  Laterality: N/A;  . Tee without cardioversion  06/20/2011    Procedure: TRANSESOPHAGEAL ECHOCARDIOGRAM (TEE);  Surgeon: Larey Dresser, MD;  Location: [facility] ENDOSCOPY;  Service: Cardiovascular;  Laterality: N/A;   Past Medical History  Diagnosis Date  . GERD (gastroesophageal reflux disease)   . Gout   . Hyperlipidemia   . Hypertension   . CVA (cerebral infarction)     left hemiparesis  . Chronic pain     left sided-Kristeins  . CAD (coronary artery disease)  s/p CABG, s/p DES to LCX  January 2011  . OSA on CPAP   . Sleep apnea   . History of colonoscopy   . Stroke   . Dysrhythmia     hx of atrial fibrilation with cardioversion   BP 150/72  Pulse 85  Resp 14  Ht 6\' 3"  (1.905 m)  Wt 267 lb (121.11 kg)  BMI 33.37 kg/m2  SpO2 98%  Opioid Risk Score:   Fall Risk Score: Moderate Fall Risk (6-13 points) (pt educated on fall risk, brochure given to pt previously)   Review of Systems  Neurological: Positive for weakness and numbness.       Tingling  All other systems reviewed and are negative.      Objective:   Physical Exam  Nursing note  and vitals reviewed. Constitutional: He is oriented to person, place, and time. He appears well-developed and well-nourished.  HENT:  Head: Normocephalic and atraumatic.  Eyes: Pupils are equal, round, and reactive to light.  Neurological: He is alert and oriented to person, place, and time. Gait abnormal.  MAS 1 at finger flexors , elbow flexors Knee flexors on Left side  Motor 4/5 In Left deltoid biceps triceps grip HF , KE. Ankle 3-/5  Psychiatric: He has a normal mood and affect.          Assessment & Plan:  1.  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  Repeat botox after July 21, no change in treatment plan Discussed with patient and agrees with plan

## 2013-08-16 NOTE — Patient Instructions (Signed)

## 2013-08-18 ENCOUNTER — Telehealth: Payer: Self-pay | Admitting: Family

## 2013-08-18 MED ORDER — BENAZEPRIL HCL 10 MG PO TABS: 10.0000 mg | ORAL_TABLET | Freq: Every day | ORAL | Status: DC

## 2013-08-18 NOTE — Telephone Encounter (Signed)
Rx request to pharmacy/SLS  

## 2013-08-18 NOTE — Telephone Encounter (Signed)
Refill- benazepril  [facility] [phone]

## 2013-08-22 ENCOUNTER — Ambulatory Visit: Payer: Medicare Other | Admitting: Family

## 2013-08-26 ENCOUNTER — Encounter: Payer: Self-pay | Admitting: Family

## 2013-08-26 ENCOUNTER — Ambulatory Visit (INDEPENDENT_AMBULATORY_CARE_PROVIDER_SITE_OTHER): Payer: Medicare Other | Admitting: Family

## 2013-08-26 VITALS — BP 150/90 | HR 72 | Temp 98.1°F | Resp 16 | Ht 75.0 in | Wt 267.1 lb

## 2013-08-26 DIAGNOSIS — J329 Chronic sinusitis, unspecified: Secondary | ICD-10-CM | POA: Diagnosis not present

## 2013-08-26 DIAGNOSIS — I2510 Atherosclerotic heart disease of native coronary artery without angina pectoris: Secondary | ICD-10-CM | POA: Diagnosis not present

## 2013-08-26 DIAGNOSIS — G459 Transient cerebral ischemic attack, unspecified: Secondary | ICD-10-CM

## 2013-08-26 DIAGNOSIS — R7309 Other abnormal glucose: Secondary | ICD-10-CM | POA: Diagnosis not present

## 2013-08-26 DIAGNOSIS — E538 Deficiency of other specified B group vitamins: Secondary | ICD-10-CM

## 2013-08-26 DIAGNOSIS — I10 Essential (primary) hypertension: Secondary | ICD-10-CM

## 2013-08-26 MED ORDER — AMOXICILLIN 500 MG PO CAPS
500.0000 mg | ORAL_CAPSULE | Freq: Three times a day (TID) | ORAL | Status: DC
Start: 2013-08-26 — End: 2013-12-02

## 2013-08-26 NOTE — Assessment & Plan Note (Signed)
Symptoms persist and have despite normal sinuses on CT 2 weeks ago, and continued use of flonase and claritin.  Will rx with amoxicillin.

## 2013-08-26 NOTE — Patient Instructions (Addendum)
Please start amoxicillin for sinus infection. Call if symptoms worsen or if symptoms do not improve. Follow up in 3 months.

## 2013-08-26 NOTE — Assessment & Plan Note (Signed)
Suspect pt suffered TIA prior to his hospitalization.  He reports increased weakness and gait issues since his hospitalization. We discussed benefits of PT referral but he declines.

## 2013-08-26 NOTE — Progress Notes (Signed)
Subjective:    Patient ID: [name], male    DOB: 11/13/1945, 68 y.o.   MRN: [id number]  HPI  [name] is a 68 yr old male who presents today for follow up.   Hospital records are reviewed.  Patient was admitted 5/27-5/28 due to sensory disturbance/ataxia.  He underwent an MRI of the brain which was negative. Work up included a normal carotid duplex.  He was discharged to home.  Reports that his gait is more unsteady since he was hospitalized.  Declines PT  His chief complaint today is green sinus drainage and left sided sinus pressure.    b12 deficiency- had low normal b12 level in the hospital. He is now taking an oral b12 supplement.  DM2-  A1C was stable and at goal in hospital. He denies symptomatic lows but does not take his sugar at home.    AF- maintained on coumadin.  Was in NSR during his hosptalization.   Follow up manual BP check today is 142/84. Pt reports that at home his blood pressure tends to run 130's of 70'-80's.    Review of Systems See HPI    Past Medical History  Diagnosis Date  . GERD (gastroesophageal reflux disease)   . Gout   . Hyperlipidemia   . Hypertension   . CVA (cerebral infarction)     left hemiparesis  . Chronic pain     left sided-Kristeins  . CAD (coronary artery disease)     s/p CABG, s/p DES to LCX  January 2011  . OSA on CPAP   . Sleep apnea   . History of colonoscopy   . Stroke   . Dysrhythmia     hx of atrial fibrilation with cardioversion    History   Social History  . Marital Status: Married    Spouse Name: [name]    Number of Children: N/A  . Years of Education: N/A   Occupational History  . retired Hydrographic surveyor   Social History Main Topics  . Smoking status: Never Smoker   . Smokeless tobacco: Never Used  . Alcohol Use: No  . Drug Use: No  . Sexual Activity: Yes   Other Topics Concern  . Not on file   Social History Narrative  . No narrative on file    Past Surgical History    Procedure Laterality Date  . Coronary artery bypass graft      stent  . Esophagogastroduodenoscopy  03-25-2005  . Nasal septum surgery    . Percutaneous placement intravascular stent cervical carotid artery      03-2009; using a drug-eluting platform of the circumflex cornoray artery with a 3.0 x 18 [employer] Promus drug-eluting platform post dilated to 3.75 with a noncompliant balloon.  . Cataract extraction  05/2010    left eye  . Cataract extraction  04/2010    right eye  . Cardioversion  06/20/2011    Procedure: CARDIOVERSION;  Surgeon: Larey Dresser, MD;  Location: [facility] ENDOSCOPY;  Service: Cardiovascular;  Laterality: N/A;  . Tee without cardioversion  06/20/2011    Procedure: TRANSESOPHAGEAL ECHOCARDIOGRAM (TEE);  Surgeon: Larey Dresser, MD;  Location: [facility] ENDOSCOPY;  Service: Cardiovascular;  Laterality: N/A;    Family History  Problem Relation Age of Onset  . Lung cancer Father     deceased  . Stroke Mother     deceased-MINISTROKES    No Known Allergies  Current Outpatient Prescriptions on File Prior to Visit  Medication Sig Dispense Refill  . allopurinol (ZYLOPRIM) 100 MG tablet Take 2 tablets (200 mg total) by mouth daily.  60 tablet  3  . aspirin 81 MG tablet Take 81 mg by mouth daily.       . benazepril (LOTENSIN) 10 MG tablet Take 1 tablet (10 mg total) by mouth daily.  30 tablet  1  . diltiazem (CARDIZEM CD) 180 MG 24 hr capsule Take 180 mg by mouth daily.      . fluticasone (FLONASE) 50 MCG/ACT nasal spray Place 2 sprays into both nostrils daily.  9.9 g  1  . furosemide (LASIX) 40 MG tablet Take 40 mg by mouth every other day.      . levothyroxine (SYNTHROID, LEVOTHROID) 112 MCG tablet Take 112 mcg by mouth daily before breakfast.      . loratadine (CLARITIN) 10 MG tablet Take 1 tablet (10 mg total) by mouth daily.  30 tablet  0  . metFORMIN (GLUCOPHAGE) 500 MG tablet Take 500 mg by mouth daily with breakfast.       . nitroGLYCERIN (NITROSTAT) 0.4 MG SL tablet  Place 0.4 mg under the tongue every 5 (five) minutes as needed for chest pain. Up to 3 doses      . pantoprazole (PROTONIX) 40 MG tablet Take 40 mg by mouth daily.      . rosuvastatin (CRESTOR) 40 MG tablet Take 40 mg by mouth daily.      . traMADol (ULTRAM) 50 MG tablet Take 50 mg by mouth 2 (two) times daily.      . traMADol (ULTRAM-ER) 200 MG 24 hr tablet Take 200 mg by mouth daily.      . vitamin B-12 1000 MCG tablet Take 1 tablet (1,000 mcg total) by mouth daily.  30 tablet  0  . warfarin (COUMADIN) 2.5 MG tablet Take 1.25-2.5 mg by mouth daily. Take 1.25mg  by mouth on Tues and Sat. Take 2.5mg  by mouth all other days       No current facility-administered medications on file prior to visit.    BP 150/90  Pulse 72  Temp(Src) 98.1 F (36.7 C) (Oral)  Resp 16  Ht 6\' 3"  (1.905 m)  Wt 267 lb 1.9 oz (121.165 kg)  BMI 33.39 kg/m2  SpO2 95%    Objective:   Physical Exam  Constitutional: He is oriented to person, place, and time. He appears well-developed and well-nourished. No distress.  HENT:  Head: Normocephalic and atraumatic.  Right Ear: Tympanic membrane and ear canal normal.  Left Ear: Tympanic membrane and ear canal normal.  Mouth/Throat: No oropharyngeal exudate, posterior oropharyngeal edema or posterior oropharyngeal erythema.  Mild left frontal sinus tenderness to palpation  Cardiovascular: Normal rate and regular rhythm.   No murmur heard. Pulmonary/Chest: Breath sounds normal. No respiratory distress. He has no wheezes. He has no rales. He exhibits no tenderness.  Lymphadenopathy:    He has no cervical adenopathy.  Neurological: He is alert and oriented to person, place, and time.  Unsteady gait is noted.            Assessment & Plan:

## 2013-08-26 NOTE — Progress Notes (Signed)
Pre visit review using our clinic review tool, if applicable. No additional management support is needed unless otherwise documented below in the visit note. 

## 2013-08-26 NOTE — Assessment & Plan Note (Signed)
Glucose is at goal.  Monitor.

## 2013-08-26 NOTE — Assessment & Plan Note (Signed)
BP Readings from Last 3 Encounters:  08/26/13 150/90  08/16/13 150/72  08/11/13 126/76   Fair BP today. Suspect element of white coat HTN. Continue current meds- monitor.

## 2013-08-26 NOTE — Assessment & Plan Note (Signed)
Continue oral supplement.  Repeat b12 level next visit.

## 2013-08-27 ENCOUNTER — Telehealth: Payer: Self-pay | Admitting: Family

## 2013-08-27 NOTE — Telephone Encounter (Signed)
Relevant patient education mailed to patient.  

## 2013-09-05 ENCOUNTER — Ambulatory Visit (INDEPENDENT_AMBULATORY_CARE_PROVIDER_SITE_OTHER): Payer: Medicare Other

## 2013-09-05 DIAGNOSIS — I4891 Unspecified atrial fibrillation: Secondary | ICD-10-CM | POA: Diagnosis not present

## 2013-09-05 DIAGNOSIS — Z7901 Long term (current) use of anticoagulants: Secondary | ICD-10-CM | POA: Diagnosis not present

## 2013-09-05 LAB — POCT INR: INR: 3.1

## 2013-09-19 ENCOUNTER — Telehealth: Payer: Self-pay | Admitting: Family

## 2013-09-19 ENCOUNTER — Other Ambulatory Visit: Payer: Self-pay | Admitting: Family

## 2013-09-19 MED ORDER — LEVOTHYROXINE SODIUM 112 MCG PO TABS: 112.0000 ug | ORAL_TABLET | Freq: Every day | ORAL | Status: DC

## 2013-09-19 MED ORDER — BENAZEPRIL HCL 10 MG PO TABS: 10.0000 mg | ORAL_TABLET | Freq: Every day | ORAL | Status: DC

## 2013-09-19 MED ORDER — ALLOPURINOL 100 MG PO TABS: 200.0000 mg | ORAL_TABLET | Freq: Every day | ORAL | Status: DC

## 2013-09-19 NOTE — Telephone Encounter (Signed)
Refill- benazepril  Refill- allopurinol  [facility] [phone]  Request 90 day supply

## 2013-09-19 NOTE — Telephone Encounter (Signed)
Levothyroxine request from pharmacy was for 156mcg twice a day. Current med list is once a day. Verified with pt that he is taking one a day. Refill denied and correct directions sent to pharmacy. Pt reminded me that he would like 90 days supply on all of his medications when they are due.

## 2013-09-19 NOTE — Telephone Encounter (Signed)
Refills sent

## 2013-09-27 ENCOUNTER — Ambulatory Visit (INDEPENDENT_AMBULATORY_CARE_PROVIDER_SITE_OTHER): Payer: Medicare Other

## 2013-09-27 DIAGNOSIS — Z7901 Long term (current) use of anticoagulants: Secondary | ICD-10-CM

## 2013-09-27 DIAGNOSIS — I4891 Unspecified atrial fibrillation: Secondary | ICD-10-CM

## 2013-09-27 LAB — POCT INR: INR: 2.8

## 2013-10-06 ENCOUNTER — Encounter: Payer: Self-pay | Admitting: Physical Medicine & Rehabilitation

## 2013-10-06 ENCOUNTER — Ambulatory Visit (HOSPITAL_BASED_OUTPATIENT_CLINIC_OR_DEPARTMENT_OTHER): Payer: Medicare Other | Admitting: Physical Medicine & Rehabilitation

## 2013-10-06 ENCOUNTER — Encounter: Payer: Medicare Other | Attending: Physical Medicine & Rehabilitation

## 2013-10-06 VITALS — BP 137/71 | HR 74 | Resp 14 | Ht 75.0 in | Wt 267.0 lb

## 2013-10-06 DIAGNOSIS — G8110 Spastic hemiplegia affecting unspecified side: Secondary | ICD-10-CM

## 2013-10-06 DIAGNOSIS — M62838 Other muscle spasm: Secondary | ICD-10-CM | POA: Diagnosis not present

## 2013-10-06 NOTE — Progress Notes (Signed)
Botox Injection for spasticity using needle EMG guidance and E stim  Dilution: 50 Units/ml  Indication: Severe spasticity which interferes with ADL,mobility and/or hygiene and is unresponsive to medication management and other conservative care  Informed consent was obtained after describing risks and benefits of the procedure with the patient. This includes bleeding, bruising, infection, excessive weakness, or medication side effects. A REMS form is on file and signed.  Needle: 27g 1" needle electrode  Number of units per muscle   Biceps100  FCR50  FCU0  FDS50  FDP0  Gastrosoleus0  Hamstrings 200  All injections were done after obtaining appropriate EMG activity and E stinm and after negative drawback for blood. The patient tolerated the procedure well. Post procedure instructions were given. A followup appointment was made.

## 2013-10-06 NOTE — Patient Instructions (Signed)

## 2013-10-08 ENCOUNTER — Other Ambulatory Visit: Payer: Self-pay | Admitting: Family

## 2013-10-16 ENCOUNTER — Other Ambulatory Visit: Payer: Self-pay | Admitting: Family

## 2013-10-25 ENCOUNTER — Ambulatory Visit (INDEPENDENT_AMBULATORY_CARE_PROVIDER_SITE_OTHER): Payer: Medicare Other | Admitting: Pharmacist Clinician (PhC)/ Clinical Pharmacy Specialist

## 2013-10-25 DIAGNOSIS — Z7901 Long term (current) use of anticoagulants: Secondary | ICD-10-CM | POA: Diagnosis not present

## 2013-10-25 DIAGNOSIS — I4891 Unspecified atrial fibrillation: Secondary | ICD-10-CM

## 2013-10-25 LAB — POCT INR: INR: 2.9

## 2013-11-17 DIAGNOSIS — L608 Other nail disorders: Secondary | ICD-10-CM | POA: Diagnosis not present

## 2013-11-17 DIAGNOSIS — I739 Peripheral vascular disease, unspecified: Secondary | ICD-10-CM | POA: Diagnosis not present

## 2013-11-17 DIAGNOSIS — L84 Corns and callosities: Secondary | ICD-10-CM | POA: Diagnosis not present

## 2013-11-19 ENCOUNTER — Other Ambulatory Visit: Payer: Self-pay | Admitting: Physical Medicine & Rehabilitation

## 2013-11-28 ENCOUNTER — Other Ambulatory Visit: Payer: Self-pay | Admitting: Family

## 2013-12-02 ENCOUNTER — Encounter: Payer: Self-pay | Admitting: Family

## 2013-12-02 ENCOUNTER — Telehealth: Payer: Self-pay | Admitting: *Deleted

## 2013-12-02 ENCOUNTER — Ambulatory Visit (INDEPENDENT_AMBULATORY_CARE_PROVIDER_SITE_OTHER): Payer: Medicare Other | Admitting: Family

## 2013-12-02 VITALS — BP 141/88 | HR 67 | Temp 98.0°F | Resp 16 | Ht 75.0 in | Wt 264.4 lb

## 2013-12-02 DIAGNOSIS — E119 Type 2 diabetes mellitus without complications: Secondary | ICD-10-CM | POA: Diagnosis not present

## 2013-12-02 DIAGNOSIS — R7309 Other abnormal glucose: Secondary | ICD-10-CM

## 2013-12-02 DIAGNOSIS — Z79899 Other long term (current) drug therapy: Secondary | ICD-10-CM | POA: Diagnosis not present

## 2013-12-02 DIAGNOSIS — E039 Hypothyroidism, unspecified: Secondary | ICD-10-CM

## 2013-12-02 DIAGNOSIS — I10 Essential (primary) hypertension: Secondary | ICD-10-CM | POA: Diagnosis not present

## 2013-12-02 DIAGNOSIS — G8110 Spastic hemiplegia affecting unspecified side: Secondary | ICD-10-CM

## 2013-12-02 DIAGNOSIS — Z23 Encounter for immunization: Secondary | ICD-10-CM | POA: Diagnosis not present

## 2013-12-02 DIAGNOSIS — Z8673 Personal history of transient ischemic attack (TIA), and cerebral infarction without residual deficits: Secondary | ICD-10-CM

## 2013-12-02 DIAGNOSIS — I2510 Atherosclerotic heart disease of native coronary artery without angina pectoris: Secondary | ICD-10-CM | POA: Diagnosis not present

## 2013-12-02 DIAGNOSIS — E785 Hyperlipidemia, unspecified: Secondary | ICD-10-CM

## 2013-12-02 DIAGNOSIS — E538 Deficiency of other specified B group vitamins: Secondary | ICD-10-CM

## 2013-12-02 LAB — BASIC METABOLIC PANEL
BUN: 18 mg/dL (ref 6–23)
CHLORIDE: 103 meq/L (ref 96–112)
CO2: 29 mEq/L (ref 19–32)
CREATININE: 1.1 mg/dL (ref 0.4–1.5)
Calcium: 9.3 mg/dL (ref 8.4–10.5)
GFR: 68.08 mL/min (ref >60.00)
Glucose, Bld: 127 mg/dL — ABNORMAL HIGH (ref 70–99)
POTASSIUM: 4.4 meq/L (ref 3.5–5.1)
Sodium: 138 mEq/L (ref 135–145)

## 2013-12-02 LAB — HEMOGLOBIN A1C: HEMOGLOBIN A1C: 6.9 % — AB (ref 4.6–6.5)

## 2013-12-02 LAB — HEPATIC FUNCTION PANEL
ALT: 39 U/L (ref 0–53)
AST: 41 U/L — ABNORMAL HIGH (ref 0–37)
Albumin: 4 g/dL (ref 3.5–5.2)
Alkaline Phosphatase: 43 U/L (ref 39–117)
BILIRUBIN DIRECT: 0.1 mg/dL (ref 0.0–0.3)
BILIRUBIN TOTAL: 0.6 mg/dL (ref 0.2–1.2)
Total Protein: 7.5 g/dL (ref 6.0–8.3)

## 2013-12-02 LAB — VITAMIN B12: Vitamin B-12: 973 pg/mL — ABNORMAL HIGH (ref 211–911)

## 2013-12-02 NOTE — Telephone Encounter (Signed)
ERROR

## 2013-12-02 NOTE — Patient Instructions (Signed)
You will be contacted about your referral to PT and Neurology. Please complete your lab work prior to leaving. Follow up in 3 months.

## 2013-12-02 NOTE — Progress Notes (Signed)
Pre visit review using our clinic review tool, if applicable. No additional management support is needed unless otherwise documented below in the visit note. 

## 2013-12-02 NOTE — Progress Notes (Signed)
Subjective:    Patient ID: [name], male    DOB: [birth year], 68 y.o.   MRN: [id number]  HPI  [name] is a 68 yr old male who presents today for follow up.  1) DM2- maintained on metformin.   Lab Results  Component Value Date   HGBA1C 6.5* 08/11/2013   HGBA1C 6.1* 05/20/2013   HGBA1C 6.7* 02/15/2013   Lab Results  Component Value Date   MICROALBUR 0.50 03/22/2012   LDLCALC 43 08/11/2013   CREATININE 1.10 08/10/2013     2) HTN- pt is maintained on benazepril, diltiazem, furosemide.  BP Readings from Last 3 Encounters:  12/02/13 141/88  10/06/13 137/71  08/26/13 150/90     3) Hypothyroid- he continues synthroid. Reports feeling well on that dose.  Lab Results  Component Value Date   TSH 0.879 08/10/2013   4) Hyperlipidemia- maintianed on crestor.   5) Left leg stiffness- notes that he has significant stiffness in his left let and this impairs his gait. He would like PT referral.     Review of Systems Reports difficulty extending the left knee.      Past Medical History  Diagnosis Date  . GERD (gastroesophageal reflux disease)   . Gout   . Hyperlipidemia   . Hypertension   . CVA (cerebral infarction)     left hemiparesis  . Chronic pain     left sided-Kristeins  . CAD (coronary artery disease)     s/p CABG, s/p DES to LCX  January 2011  . OSA on CPAP   . Sleep apnea   . History of colonoscopy   . Stroke   . Dysrhythmia     hx of atrial fibrilation with cardioversion    History   Social History  . Marital Status: Married    Spouse Name: [name]    Number of Children: N/A  . Years of Education: N/A   Occupational History  . retired Hydrographic surveyor   Social History Main Topics  . Smoking status: Never Smoker   . Smokeless tobacco: Never Used  . Alcohol Use: No  . Drug Use: No  . Sexual Activity: Yes   Other Topics Concern  . Not on file   Social History Narrative  . No narrative on file    Past Surgical History    Procedure Laterality Date  . Coronary artery bypass graft      stent  . Esophagogastroduodenoscopy  03-25-2005  . Nasal septum surgery    . Percutaneous placement intravascular stent cervical carotid artery      03-2009; using a drug-eluting platform of the circumflex cornoray artery with a 3.0 x 18 [employer] Promus drug-eluting platform post dilated to 3.75 with a noncompliant balloon.  . Cataract extraction  05/2010    left eye  . Cataract extraction  04/2010    right eye  . Cardioversion  06/20/2011    Procedure: CARDIOVERSION;  Surgeon: Larey Dresser, MD;  Location: [facility] ENDOSCOPY;  Service: Cardiovascular;  Laterality: N/A;  . Tee without cardioversion  06/20/2011    Procedure: TRANSESOPHAGEAL ECHOCARDIOGRAM (TEE);  Surgeon: Larey Dresser, MD;  Location: [facility] ENDOSCOPY;  Service: Cardiovascular;  Laterality: N/A;    Family History  Problem Relation Age of Onset  . Lung cancer Father     deceased  . Stroke Mother     deceased-MINISTROKES    No Known Allergies  Current Outpatient Prescriptions on File Prior to Visit  Medication  Sig Dispense Refill  . allopurinol (ZYLOPRIM) 100 MG tablet Take 2 tablets (200 mg total) by mouth daily.  180 tablet  1  . aspirin 81 MG tablet Take 81 mg by mouth daily.       . benazepril (LOTENSIN) 10 MG tablet Take 1 tablet (10 mg total) by mouth daily.  90 tablet  1  . CRESTOR 40 MG tablet TAKE 1 TABLET BY MOUTH EVERY DAY  90 tablet  0  . diltiazem (CARDIZEM CD) 180 MG 24 hr capsule TAKE 1 CAPSULE (180 MG TOTAL) BY MOUTH DAILY.  90 capsule  1  . fluticasone (FLONASE) 50 MCG/ACT nasal spray Place 2 sprays into both nostrils daily.  9.9 g  1  . furosemide (LASIX) 40 MG tablet TAKE 1 TABLET (40 MG TOTAL) BY MOUTH EVERY OTHER DAY.  45 tablet  1  . levothyroxine (SYNTHROID, LEVOTHROID) 112 MCG tablet Take 1 tablet (112 mcg total) by mouth daily before breakfast.  90 tablet  1  . loratadine (CLARITIN) 10 MG tablet Take 1 tablet (10 mg total) by mouth  daily.  30 tablet  0  . metFORMIN (GLUCOPHAGE) 500 MG tablet Take 500 mg by mouth daily with breakfast.       . nitroGLYCERIN (NITROSTAT) 0.4 MG SL tablet Place 0.4 mg under the tongue every 5 (five) minutes as needed for chest pain. Up to 3 doses      . pantoprazole (PROTONIX) 40 MG tablet TAKE 1 TABLET (40 MG TOTAL) BY MOUTH DAILY.  90 tablet  1  . traMADol (ULTRAM) 50 MG tablet TAKE 1 TABLET TWICE A DAY  180 tablet  1  . traMADol (ULTRAM-ER) 200 MG 24 hr tablet TAKE 1 TABLET EVERY DAY WITH AM DOSE OF TRAMADOL 50 MG  90 tablet  1  . vitamin B-12 1000 MCG tablet Take 1 tablet (1,000 mcg total) by mouth daily.  30 tablet  0  . warfarin (COUMADIN) 2.5 MG tablet Take 1.25-2.5 mg by mouth daily. Take 1.25mg  by mouth on Tues and Sat. Take 2.5mg  by mouth all other days       No current facility-administered medications on file prior to visit.    BP 141/88  Pulse 67  Temp(Src) 98 F (36.7 C) (Oral)  Resp 16  Ht 6\' 3"  (1.905 m)  Wt 264 lb 6.4 oz (119.931 kg)  BMI 33.05 kg/m2  SpO2 97%    Objective:   Physical Exam  Constitutional: He is oriented to person, place, and time. He appears well-developed and well-nourished. No distress.  HENT:  Head: Normocephalic and atraumatic.  Cardiovascular: Normal rate and regular rhythm.   No murmur heard. Pulmonary/Chest: Effort normal and breath sounds normal. No respiratory distress. He has no wheezes. He has no rales. He exhibits no tenderness.  Musculoskeletal:  LLE atrophy is noted.  Some stiffness with flexion/extension of the left knee.   Neurological: He is alert and oriented to person, place, and time.  Psychiatric: He has a normal mood and affect. His behavior is normal. Judgment and thought content normal.          Assessment & Plan:

## 2013-12-04 ENCOUNTER — Encounter: Payer: Self-pay | Admitting: Family

## 2013-12-04 NOTE — Assessment & Plan Note (Signed)
Clinically stable, obtain follow up A1C.  

## 2013-12-04 NOTE — Assessment & Plan Note (Signed)
Refer for PT for help with mobility left leg and gait training.

## 2013-12-04 NOTE — Assessment & Plan Note (Signed)
SBP slightly elevated today, continue current meds for now- monitor.

## 2013-12-04 NOTE — Assessment & Plan Note (Signed)
LDL at goal, continue crestor.

## 2013-12-04 NOTE — Assessment & Plan Note (Signed)
Clinically stable, continue synthroid.  

## 2013-12-05 ENCOUNTER — Other Ambulatory Visit: Payer: Self-pay | Admitting: Family

## 2013-12-05 NOTE — Telephone Encounter (Signed)
Medication Detail      Disp Refills Start End     diltiazem (CARDIZEM CD) 180 MG 24 hr capsule 90 capsule 1 11/28/2013     Sig: TAKE 1 CAPSULE (180 MG TOTAL) BY MOUTH DAILY.    E-Prescribing Status: Receipt confirmed by pharmacy (11/28/2013 4:16 PM EDT)    Pharmacy    [id number] - [place], [address]

## 2013-12-06 ENCOUNTER — Ambulatory Visit (INDEPENDENT_AMBULATORY_CARE_PROVIDER_SITE_OTHER): Payer: Medicare Other

## 2013-12-06 DIAGNOSIS — Z7901 Long term (current) use of anticoagulants: Secondary | ICD-10-CM

## 2013-12-06 DIAGNOSIS — I4891 Unspecified atrial fibrillation: Secondary | ICD-10-CM

## 2013-12-06 LAB — POCT INR: INR: 2.5

## 2014-01-05 ENCOUNTER — Encounter: Payer: Medicare Other | Attending: Physical Medicine & Rehabilitation

## 2014-01-05 ENCOUNTER — Encounter: Payer: Self-pay | Admitting: Physical Medicine & Rehabilitation

## 2014-01-05 ENCOUNTER — Ambulatory Visit (HOSPITAL_BASED_OUTPATIENT_CLINIC_OR_DEPARTMENT_OTHER): Payer: Medicare Other | Admitting: Physical Medicine & Rehabilitation

## 2014-01-05 VITALS — BP 134/86 | HR 78 | Resp 14 | Ht 75.0 in | Wt 267.8 lb

## 2014-01-05 DIAGNOSIS — G8110 Spastic hemiplegia affecting unspecified side: Secondary | ICD-10-CM

## 2014-01-05 DIAGNOSIS — M62838 Other muscle spasm: Secondary | ICD-10-CM

## 2014-01-05 NOTE — Progress Notes (Signed)
Botox Injection for spasticity using needle EMG guidance and E stim  Dilution: 50 Units/ml  Indication: Severe spasticity which interferes with ADL,mobility and/or hygiene and is unresponsive to medication management and other conservative care  Informed consent was obtained after describing risks and benefits of the procedure with the patient. This includes bleeding, bruising, infection, excessive weakness, or medication side effects. A REMS form is on file and signed.  Needle: 27g 1" needle electrode  Number of units per muscle   Biceps100  FCR50  FCU0  FDS50  FDP0  Gastrosoleus0  Hamstrings 200  All injections were done after obtaining appropriate EMG activity and E stinm and after negative drawback for blood. The patient tolerated the procedure well. Post procedure instructions were given. A followup appointment was made.

## 2014-01-05 NOTE — Patient Instructions (Signed)
AbobotulinumtoxinA injection What is this medicine? ABOBOTULINUMTOXINA (ay boh BOT yoo li num TOX in A) is a neuro-muscular blocker. This medicine is used to treat severe neck muscle spasms. It is also used to treat frown lines or lines between the eyebrows on the face. This medicine may be used for other purposes; ask your health care provider or pharmacist if you have questions. COMMON BRAND NAME(S): Dysport What should I tell my health care provider before I take this medicine? They need to know if you have any of these conditions: -breathing problems -diabetes -heart problems -history of surgery where this medicine is going to be used -infection where this medicine is going to be used -myasthenia gravis or other neurologic disease -nerve or muscle disease -surgery plans -an unusual or allergic reaction to botulinum toxin, albumin, cow's milk protein, other medicines, foods, dyes, or preservatives -pregnant or trying to get pregnant -breast-feeding How should I use this medicine? This medicine is for injection into a muscle. It is given by a health care professional in a hospital or clinic setting. A special MedGuide will be given to you with each prescription and refill. Be sure to read this information carefully each time. Talk to your pediatrician regarding the use of this medicine in children. Special care may be needed. Overdosage: If you think you've taken too much of this medicine contact a poison control center or emergency room at once. Overdosage: If you think you have taken too much of this medicine contact a poison control center or emergency room at once. NOTE: This medicine is only for you. Do not share this medicine with others. What if I miss a dose? This does not apply. What may interact with this medicine? -aminoglycoside antibiotics like gentamicin, neomycin, tobramycin -muscle relaxants -other botulinum toxin injections This list may not describe all possible  interactions. Give your health care provider a list of all the medicines, herbs, non-prescription drugs, or dietary supplements you use. Also tell them if you smoke, drink alcohol, or use illegal drugs. Some items may interact with your medicine. What should I watch for while using this medicine? Visit your doctor for regular check ups. This medicine will cause weakness in the muscle where it is injected. Tell your doctor if you feel unusually weak in other muscles. Get medical help right away if you have problems with breathing, swallowing, or talking. This medicine contains albumin from human blood. It may be possible to pass an infection in this medicine, but no cases have been reported. Talk to your doctor about the risks and benefits of this medicine. If your activities have been limited by your condition, go back to your regular routine slowly after treatment with this medicine. This medicine can make your muscles weak. And, this medicine can make your eyelids droop or make you see blurry or double. If you have weak muscles or trouble seeing do not drive a car, use machinery, or do other dangerous activities. What side effects may I notice from receiving this medicine? Side effects that you should report to your doctor or health care professional as soon as possible: -allergic reactions like skin rash, itching or hives, swelling of the face, lips, or tongue -breathing problems -changes in vision -chest pain or tightness -eye pain, infection -fast, irregular heartbeat -fever, flu-like symptoms -numbness -speech problems -swallowing problems Side effects that usually do not require medical attention (report to your doctor or health care professional if they continue or are bothersome): -bruising or pain at site where  injected -drooping eyelid -dry eyes or mouth -eye irritation -eye pointing down or up -headache -sensitivity to light -tearing This list may not describe all possible  side effects. Call your doctor for medical advice about side effects. You may report side effects to FDA at 1-800-FDA-1088. Where should I keep my medicine? This drug is given in a hospital or clinic and will not be stored at home. NOTE: This sheet is a summary. It may not cover all possible information. If you have questions about this medicine, talk to your doctor, pharmacist, or health care provider.  2015, [employer]vier/Gold Standard. (2013-04-26 10:30:54)

## 2014-01-12 ENCOUNTER — Ambulatory Visit (INDEPENDENT_AMBULATORY_CARE_PROVIDER_SITE_OTHER): Payer: Medicare Other | Admitting: Neurology

## 2014-01-12 ENCOUNTER — Encounter: Payer: Self-pay | Admitting: Neurology

## 2014-01-12 VITALS — BP 118/70 | HR 74 | Resp 16 | Ht 75.0 in | Wt 262.2 lb

## 2014-01-12 DIAGNOSIS — G890 Central pain syndrome: Secondary | ICD-10-CM | POA: Diagnosis not present

## 2014-01-12 DIAGNOSIS — I480 Paroxysmal atrial fibrillation: Secondary | ICD-10-CM | POA: Diagnosis not present

## 2014-01-12 DIAGNOSIS — G459 Transient cerebral ischemic attack, unspecified: Secondary | ICD-10-CM

## 2014-01-12 DIAGNOSIS — G629 Polyneuropathy, unspecified: Secondary | ICD-10-CM

## 2014-01-12 DIAGNOSIS — I2510 Atherosclerotic heart disease of native coronary artery without angina pectoris: Secondary | ICD-10-CM

## 2014-01-12 DIAGNOSIS — G8110 Spastic hemiplegia affecting unspecified side: Secondary | ICD-10-CM | POA: Diagnosis not present

## 2014-01-12 NOTE — Progress Notes (Signed)
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  Records and images personally reviewed.  He had a right basal ganglia hemorrhagic infarct in 1996, presumably from hypertension, with residual left sided spastic hemiparesis and Dejerine-Roussy syndrome  .  He takes tramadol.  Pain is generally under control but still has tightness in the left leg.  He has numbness and tingling in the feet.  He generally feels fatigued.  He was diagnosed with atrial fibrillation in 2004 and subsequently underwent cardioversion.  He had a recurrence of atrial fibrillation in April 2013, which was thought to be triggered by acute illness.  He has been on both aspirin and warfarin since then.Marland Kitchen  He takes Lipitor 40mg .  Hgb A1c from 12/02/13 was 6.9.  2D echo from 11/22/12 showed EF 55-60% and grade 2 diastolic dysfunction.  He was most recently admitted to the hospital on 08/10/13 for TIA, presenting as worsening numbness of the left face and arm as well as slightly increased weakness of the left leg.  MRI of the brain revealed no acute infarct.  LDL was 43.  Carotid doppler revealed no hemodynamically significant ICA stenosis.  B12 was 263.    He was previously followed by Dr. Morene Antu at [facility], who is now retired.  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  MRI of the brain from 12/30/09  showed no acute intracranial abnormalities.  He continued to have episodes of vertigo, usually in the morning when he would first get up.  Prior treatment for the pain included gabapentin, Lyrica, Cymbalta and nortriptyline, which were all ineffective.  Prior tests include a NCV-EMG from 06/03/10, which showed chronic bilateral L5 radiculopathies and chronic left S1 radiculopathy.  Lumbar spine X-ray from 07/01/10, showed degenerative disc disease and osteophytes prominent at L4-5 and L5-S1.  Lab work from 05/31/10 dis show borderline low-normal B12 level of 251 with methylmalonic acid level of 147.  PAST MEDICAL HISTORY: Past Medical History  Diagnosis Date  . GERD (gastroesophageal reflux disease)   . Gout   . Hyperlipidemia   . Hypertension   . CVA (cerebral infarction)     left hemiparesis  . Chronic pain     left sided-Kristeins  . CAD (coronary artery disease)     s/p CABG, s/p DES to LCX  January 2011  . OSA on CPAP   . Sleep apnea   . History of colonoscopy   . Stroke   . Dysrhythmia     hx of atrial fibrilation with cardioversion    PAST SURGICAL HISTORY: Past Surgical History  Procedure Laterality Date  . Coronary artery bypass graft      stent  . Esophagogastroduodenoscopy  03-25-2005  . Nasal septum surgery    . Percutaneous placement intravascular stent cervical carotid artery      03-2009; using a drug-eluting platform of the circumflex cornoray artery with a 3.0 x 18 [employer] Promus drug-eluting platform post dilated to 3.75  with a noncompliant balloon.  . Cataract extraction  05/2010    left eye  . Cataract extraction  04/2010    right eye  . Cardioversion  06/20/2011    Procedure: CARDIOVERSION;  Surgeon: Larey Dresser, MD;  Location: [facility] ENDOSCOPY;  Service: Cardiovascular;  Laterality: N/A;  . Tee without cardioversion  06/20/2011    Procedure: TRANSESOPHAGEAL ECHOCARDIOGRAM (TEE);  Surgeon: Larey Dresser, MD;  Location: [facility] ENDOSCOPY;  Service:  Cardiovascular;  Laterality: N/A;    MEDICATIONS: Current Outpatient Prescriptions on File Prior to Visit  Medication Sig Dispense Refill  . allopurinol (ZYLOPRIM) 100 MG tablet Take 2 tablets (200 mg total) by mouth daily.  180 tablet  1  . aspirin 81 MG tablet Take 81 mg by mouth daily.       . benazepril (LOTENSIN) 10 MG tablet Take 1 tablet (10 mg total) by mouth daily.  90 tablet  1  . CRESTOR 40 MG tablet TAKE 1 TABLET BY MOUTH EVERY DAY  90 tablet  0  . diltiazem (CARDIZEM CD) 180 MG 24 hr capsule TAKE 1 CAPSULE (180 MG TOTAL) BY MOUTH DAILY.  90 capsule  1  . fluticasone (FLONASE) 50 MCG/ACT nasal spray Place 2 sprays into both nostrils daily.  9.9 g  1  . furosemide (LASIX) 40 MG tablet TAKE 1 TABLET (40 MG TOTAL) BY MOUTH EVERY OTHER DAY.  45 tablet  1  . levothyroxine (SYNTHROID, LEVOTHROID) 112 MCG tablet Take 1 tablet (112 mcg total) by mouth daily before breakfast.  90 tablet  1  . loratadine (CLARITIN) 10 MG tablet Take 1 tablet (10 mg total) by mouth daily.  30 tablet  0  . metFORMIN (GLUCOPHAGE) 500 MG tablet Take 500 mg by mouth daily with breakfast.       . nitroGLYCERIN (NITROSTAT) 0.4 MG SL tablet Place 0.4 mg under the tongue every 5 (five) minutes as needed for chest pain. Up to 3 doses      . pantoprazole (PROTONIX) 40 MG tablet TAKE 1 TABLET (40 MG TOTAL) BY MOUTH DAILY.  90 tablet  1  . traMADol (ULTRAM) 50 MG tablet TAKE 1 TABLET TWICE A DAY  180 tablet  1  . traMADol (ULTRAM-ER) 200 MG 24 hr tablet TAKE 1 TABLET EVERY DAY WITH AM DOSE OF TRAMADOL 50 MG  90 tablet  1  . vitamin B-12 1000 MCG tablet Take 1 tablet (1,000 mcg total) by mouth daily.  30 tablet  0  . warfarin (COUMADIN) 2.5 MG tablet Take 1.25-2.5 mg by mouth daily. Take 1.25mg  by mouth on Tues and Sat. Take 2.5mg  by mouth all other days       No current facility-administered medications on file prior to visit.    ALLERGIES: No Known Allergies  FAMILY HISTORY: Family History  Problem Relation Age  of Onset  . Lung cancer Father     deceased  . Stroke Mother     deceased-MINISTROKES    SOCIAL HISTORY: History   Social History  . Marital Status: Married    Spouse Name: [name]    Number of Children: N/A  . Years of Education: N/A   Occupational History  . retired Hydrographic surveyor   Social History Main Topics  . Smoking status: Former Research scientist (life sciences)  . Smokeless tobacco: Never Used  . Alcohol Use: Yes     Comment: 1 beer a month  . Drug Use: No  . Sexual Activity: No   Other Topics  Concern  . Not on file   Social History Narrative  . No narrative on file    REVIEW OF SYSTEMS: Constitutional: fatigue Eyes: No visual changes, double vision, eye pain Ear, nose and throat: No hearing loss, ear pain, nasal congestion, sore throat Cardiovascular: No chest pain, palpitations Respiratory:  No shortness of breath at rest or with exertion, wheezes GastrointestinaI: No nausea, vomiting, diarrhea, abdominal pain, fecal incontinence Genitourinary:  No dysuria, urinary retention or frequency Musculoskeletal:  No neck pain, back pain Integumentary: No rash, pruritus, skin lesions Neurological: as above Psychiatric: No depression, insomnia, anxiety Endocrine: No palpitations, fatigue, diaphoresis, mood swings, change in appetite, change in weight, increased thirst Hematologic/Lymphatic:  No anemia, purpura, petechiae. Allergic/Immunologic: no itchy/runny eyes, nasal congestion, recent allergic reactions, rashes  PHYSICAL EXAM: Filed Vitals:   01/12/14 1046  BP: 118/70  Pulse: 74  Resp: 16   General: No acute distress Head:  Normocephalic/atraumatic Neck: supple, no paraspinal tenderness, full range of motion Back: No paraspinal tenderness Heart: regular rate and rhythm Lungs: Clear to auscultation bilaterally. Vascular: No carotid bruits. Neurological Exam: Mental status: alert and oriented to person, place, and time, recalled only 1 of 3 words, remote memory  intact, fund of knowledge intact, attention and concentration intact, speech fluent and not dysarthric, language intact. Cranial nerves: CN I: not tested CN II: pupils equal, round and reactive to light, visual fields intact, fundi not visualized. CN III, IV, VI:  full range of motion, no nystagmus, no ptosis CN V: reduced left V2-V3 sensation CN VII: mild right lower facial weakness CN VIII: hearing intact CN IX, X: gag intact, uvula midline CN XI: sternocleidomastoid and trapezius muscles intact CN XII: tongue midline Bulk & Tone: increased on the left, no fasciculations. Motor: 5- left grip.  Otherwise, 5/5. Sensation: reduced pinprick sensation in the feet, left worse than right.  Vibration fairly intact. Deep Tendon Reflexes: 2+ throughout.  Toes downgoing. Finger to nose testing: mild dysmetria on the left. Gait: left limp.  Cannot tandem. Romberg with sway.  IMPRESSION: Transient ischemic attack History of right hemorrhagic basal ganglia infarct, secondary to hypertension Dejerine-Roussy syndrome   Left spastic hemiparesis Peripheral neuropathy  PLAN: 1.  If no contraindication, I would favor discontinuing aspirin and just continue warfarin for secondary stroke prevention. 2.  Continue statin therapy (LDL at goal) 3.  Diabetes and blood pressure control (overall doing well) 4.  Routine exercise and Mediterranean diet 5.  Follow up in one year.  60 minutes spent with patient, over 50% spent counseling and coordinating care  Thank you for allowing me to take part in the care of this patient.  Metta Clines, DO  CC:  Debbrah Alar, NP  [name], MD

## 2014-01-12 NOTE — Patient Instructions (Signed)
1.  I would discontinue aspirin if it is okay with your cardiologist and PCP. 2.  Keep active 3.  Follow up in one year or as needed

## 2014-01-17 NOTE — Progress Notes (Unsigned)
Botox Injection for spasticity using needle EMG guidance and E stim Dilution: 50 Units/ml  Indication: Severe spasticity which interferes with ADL,mobility and/or hygiene and is unresponsive to medication management and other conservative care  Informed consent was obtained after describing risks and benefits of the procedure with the patient. This includes bleeding, bruising, infection, excessive weakness, or medication side effects. A REMS form is on file and signed.  Needle: 27g,1" needle electrode  Number of units per muscle  Pectoralis0  Biceps100  FCR50  FCU0  FDS50  FDP0  Gastrosoleus0  Hamstrings 200   All injections were done after obtaining appropriate EMG activity and E stinm and after negative drawback for blood. The patient tolerated the procedure well. Post procedure instructions were given. A followup appointment was made.

## 2014-01-18 ENCOUNTER — Telehealth: Payer: Self-pay | Admitting: Family

## 2014-01-18 NOTE — Telephone Encounter (Addendum)
I received not from pt's neurologist that it is OK to d/c ASA if OK with cardiology. What do you think?

## 2014-01-19 NOTE — Telephone Encounter (Signed)
Ok as long as he stays on warfarin

## 2014-01-20 NOTE — Telephone Encounter (Signed)
Please contact patient and let him know that I reviewed with Dr. Aundra Dubin the neurologist's recommendation that he stop aspirin and Dr. Aundra Dubin agrees that it is OK to stop aspirin as long as he stays on warfarin.

## 2014-01-20 NOTE — Telephone Encounter (Signed)
Agree 

## 2014-01-20 NOTE — Telephone Encounter (Signed)
Notified pt. He wanted to know if he should make any adjustments with coumadin once he stops ASA. Advised him to continue current dose of coumadin and follow up as scheduled with coumadin clinic and they will advise him if any adjustments need to be made at next check.

## 2014-01-21 ENCOUNTER — Other Ambulatory Visit: Payer: Self-pay | Admitting: Family

## 2014-01-21 ENCOUNTER — Other Ambulatory Visit: Payer: Self-pay | Admitting: Internal Medicine

## 2014-01-30 DIAGNOSIS — R05 Cough: Secondary | ICD-10-CM | POA: Diagnosis not present

## 2014-01-30 DIAGNOSIS — J40 Bronchitis, not specified as acute or chronic: Secondary | ICD-10-CM | POA: Diagnosis not present

## 2014-01-30 DIAGNOSIS — R062 Wheezing: Secondary | ICD-10-CM | POA: Diagnosis not present

## 2014-01-30 DIAGNOSIS — R918 Other nonspecific abnormal finding of lung field: Secondary | ICD-10-CM | POA: Diagnosis not present

## 2014-02-14 ENCOUNTER — Ambulatory Visit (INDEPENDENT_AMBULATORY_CARE_PROVIDER_SITE_OTHER): Payer: Medicare Other

## 2014-02-14 DIAGNOSIS — I4891 Unspecified atrial fibrillation: Secondary | ICD-10-CM | POA: Diagnosis not present

## 2014-02-14 DIAGNOSIS — Z7901 Long term (current) use of anticoagulants: Secondary | ICD-10-CM | POA: Diagnosis not present

## 2014-02-14 LAB — POCT INR: INR: 3.5

## 2014-02-16 ENCOUNTER — Encounter: Payer: Self-pay | Admitting: Neurology

## 2014-02-17 ENCOUNTER — Telehealth: Payer: Self-pay | Admitting: Cardiology

## 2014-02-17 NOTE — Telephone Encounter (Signed)
Spoke with pt, pt states his 81mg  ASA was discontinued at last OV with primary MD.  81mg  removed from pt's medication list.

## 2014-02-17 NOTE — Telephone Encounter (Signed)
New Msg   Pt requesting to speak with coumadin about warfarin. Please contact at [phone].

## 2014-02-28 ENCOUNTER — Ambulatory Visit (INDEPENDENT_AMBULATORY_CARE_PROVIDER_SITE_OTHER): Payer: Medicare Other | Admitting: Family

## 2014-02-28 ENCOUNTER — Telehealth: Payer: Self-pay | Admitting: Family

## 2014-02-28 ENCOUNTER — Ambulatory Visit (HOSPITAL_BASED_OUTPATIENT_CLINIC_OR_DEPARTMENT_OTHER)
Admission: RE | Admit: 2014-02-28 | Discharge: 2014-02-28 | Disposition: A | Discharge status: Home / Self Care | Payer: Medicare Other | Source: Ambulatory Visit | Attending: Family | Admitting: Family

## 2014-02-28 ENCOUNTER — Encounter: Payer: Self-pay | Admitting: Family

## 2014-02-28 VITALS — BP 146/89 | HR 103 | Temp 98.3°F | Resp 18 | Wt 260.0 lb

## 2014-02-28 DIAGNOSIS — R05 Cough: Secondary | ICD-10-CM

## 2014-02-28 DIAGNOSIS — J069 Acute upper respiratory infection, unspecified: Secondary | ICD-10-CM

## 2014-02-28 DIAGNOSIS — I2510 Atherosclerotic heart disease of native coronary artery without angina pectoris: Secondary | ICD-10-CM | POA: Diagnosis not present

## 2014-02-28 DIAGNOSIS — R059 Cough, unspecified: Secondary | ICD-10-CM

## 2014-02-28 DIAGNOSIS — Q791 Other congenital malformations of diaphragm: Secondary | ICD-10-CM | POA: Insufficient documentation

## 2014-02-28 DIAGNOSIS — R0989 Other specified symptoms and signs involving the circulatory and respiratory systems: Secondary | ICD-10-CM | POA: Insufficient documentation

## 2014-02-28 DIAGNOSIS — B9789 Other viral agents as the cause of diseases classified elsewhere: Secondary | ICD-10-CM

## 2014-02-28 MED ORDER — GUAIFENESIN-CODEINE 100-10 MG/5ML PO SOLN: 10.0000 mL | Freq: Four times a day (QID) | ORAL | Status: DC | PRN

## 2014-02-28 NOTE — Patient Instructions (Signed)
Please complete your chest x ray on the first floor.  We will contact you with your results and further recommendations.

## 2014-02-28 NOTE — Telephone Encounter (Signed)
Spoke to pt re: neg x ray results. Likely viral URI. Advised increase rest, fluids, call Friday Am if symptoms worsen or if not improved.  He requests refill of cough syrup. Refill faxed.

## 2014-02-28 NOTE — Progress Notes (Signed)
Pre visit review using our clinic review tool, if applicable. No additional management support is needed unless otherwise documented below in the visit note. 

## 2014-02-28 NOTE — Telephone Encounter (Signed)
Inquiring about x ray results

## 2014-02-28 NOTE — Progress Notes (Signed)
Subjective:    Patient ID: [name], male    DOB: January 11, 1946, 68 y.o.   MRN: [id number]  HPI  [name] is a 68 yr old male who presents today with chief complaint of cough. Pt reports that the cough started when he was in [place]. Took meds and symptoms seemed to improve. This week he developed cough/chest congestion. He denies fever.  Reports that he feels "washed out."  He reports that heh has some mild sinus congestion.  He is using oxygen 2l at night.    Wt Readings from Last 3 Encounters:  02/28/14 260 lb (117.935 kg)  01/12/14 262 lb 3.2 oz (118.933 kg)  01/05/14 267 lb 12.8 oz (121.473 kg)     Review of Systems    see HPI  Past Medical History  Diagnosis Date  . GERD (gastroesophageal reflux disease)   . Gout   . Hyperlipidemia   . Hypertension   . CVA (cerebral infarction)     left hemiparesis  . Chronic pain     left sided-Kristeins  . CAD (coronary artery disease)     s/p CABG, s/p DES to LCX  January 2011  . OSA on CPAP   . Sleep apnea   . History of colonoscopy   . Stroke   . Dysrhythmia     hx of atrial fibrilation with cardioversion    History   Social History  . Marital Status: Married    Spouse Name: [name]    Number of Children: N/A  . Years of Education: N/A   Occupational History  . retired Hydrographic surveyor   Social History Main Topics  . Smoking status: Former Research scientist (life sciences)  . Smokeless tobacco: Never Used  . Alcohol Use: Yes     Comment: 1 beer a month  . Drug Use: No  . Sexual Activity: No   Other Topics Concern  . Not on file   Social History Narrative    Past Surgical History  Procedure Laterality Date  . Coronary artery bypass graft      stent  . Esophagogastroduodenoscopy  03-25-2005  . Nasal septum surgery    . Percutaneous placement intravascular stent cervical carotid artery      03-2009; using a drug-eluting platform of the circumflex cornoray artery with a 3.0 x 18 [employer] Promus drug-eluting  platform post dilated to 3.75 with a noncompliant balloon.  . Cataract extraction  05/2010    left eye  . Cataract extraction  04/2010    right eye  . Cardioversion  06/20/2011    Procedure: CARDIOVERSION;  Surgeon: Larey Dresser, MD;  Location: [facility] ENDOSCOPY;  Service: Cardiovascular;  Laterality: N/A;  . Tee without cardioversion  06/20/2011    Procedure: TRANSESOPHAGEAL ECHOCARDIOGRAM (TEE);  Surgeon: Larey Dresser, MD;  Location: [facility] ENDOSCOPY;  Service: Cardiovascular;  Laterality: N/A;    Family History  Problem Relation Age of Onset  . Lung cancer Father     deceased  . Stroke Mother     deceased-MINISTROKES    No Known Allergies  Current Outpatient Prescriptions on File Prior to Visit  Medication Sig Dispense Refill  . allopurinol (ZYLOPRIM) 100 MG tablet Take 2 tablets (200 mg total) by mouth daily. 180 tablet 1  . benazepril (LOTENSIN) 10 MG tablet Take 1 tablet (10 mg total) by mouth daily. 90 tablet 1  . CRESTOR 40 MG tablet TAKE 1 TABLET BY MOUTH EVERY DAY 90 tablet 1  . diltiazem (  CARDIZEM CD) 180 MG 24 hr capsule TAKE 1 CAPSULE (180 MG TOTAL) BY MOUTH DAILY. 90 capsule 1  . fluticasone (FLONASE) 50 MCG/ACT nasal spray Place 2 sprays into both nostrils daily. 9.9 g 1  . furosemide (LASIX) 40 MG tablet TAKE 1 TABLET (40 MG TOTAL) BY MOUTH EVERY OTHER DAY. 45 tablet 1  . levothyroxine (SYNTHROID, LEVOTHROID) 112 MCG tablet Take 1 tablet (112 mcg total) by mouth daily before breakfast. 90 tablet 1  . loratadine (CLARITIN) 10 MG tablet Take 1 tablet (10 mg total) by mouth daily. 30 tablet 0  . metFORMIN (GLUCOPHAGE) 500 MG tablet TAKE 500 MG BY MOUTH DAILY WITH BREAKFAST. 90 tablet 1  . nitroGLYCERIN (NITROSTAT) 0.4 MG SL tablet Place 0.4 mg under the tongue every 5 (five) minutes as needed for chest pain. Up to 3 doses    . pantoprazole (PROTONIX) 40 MG tablet TAKE 1 TABLET (40 MG TOTAL) BY MOUTH DAILY. 90 tablet 1  . traMADol (ULTRAM) 50 MG tablet TAKE 1 TABLET TWICE A DAY  180 tablet 1  . traMADol (ULTRAM-ER) 200 MG 24 hr tablet TAKE 1 TABLET EVERY DAY WITH AM DOSE OF TRAMADOL 50 MG 90 tablet 1  . vitamin B-12 1000 MCG tablet Take 1 tablet (1,000 mcg total) by mouth daily. 30 tablet 0  . warfarin (COUMADIN) 2.5 MG tablet Take 1.25-2.5 mg by mouth daily. Take 1.25mg  by mouth on Tues and Sat. Take 2.5mg  by mouth all other days     No current facility-administered medications on file prior to visit.    BP 146/89 mmHg  Pulse 103  Temp(Src) 98.3 F (36.8 C) (Oral)  Resp 18  Wt 260 lb (117.935 kg)  SpO2 96%    Objective:   Physical Exam  Constitutional: He is oriented to person, place, and time. He appears well-developed and well-nourished. No distress.  Cardiovascular: Normal rate and regular rhythm.   No murmur heard. Pulmonary/Chest: Effort normal.  Faint expiratory wheeze, faint bibasilar crackles.   Neurological: He is alert and oriented to person, place, and time.  Skin: Skin is warm and dry.  Psychiatric: He has a normal mood and affect. His behavior is normal. Judgment and thought content normal.          Assessment & Plan:

## 2014-03-03 ENCOUNTER — Telehealth: Payer: Self-pay | Admitting: Family

## 2014-03-03 ENCOUNTER — Telehealth: Payer: Self-pay | Admitting: *Deleted

## 2014-03-03 MED ORDER — CEFDINIR 300 MG PO CAPS: 300.0000 mg | ORAL_CAPSULE | Freq: Two times a day (BID) | ORAL | Status: DC

## 2014-03-03 NOTE — Telephone Encounter (Signed)
Pt is following up on previous message. Please call home # [phone]

## 2014-03-03 NOTE — Telephone Encounter (Signed)
Notified pt and he voices understanding. 

## 2014-03-03 NOTE — Telephone Encounter (Signed)
Note   ----- Message -----    From: [place] Dragon    Sent: 03/03/2014 10:40 AM     To: Debbrah Alar, NP, *   Subject: does not consent                     Spoke to patient, declined therapy at this time.                        Type Date User   General 12/02/2013 1:35 PM SEBASTIAN, JENNIFER S        Note   In [facility], awaiting appt, pt aware                       Type Date User   Provider Comments 12/02/2013 10:14 AM Kelle Darting A        Summary   Provider Comments        Note   68 yr old male with hx of CVA, has a tightening of left leg, needs help with stretching/strengthening etc.

## 2014-03-03 NOTE — Telephone Encounter (Signed)
Please let pt know that I sent rx for antibiotic. Needs to be seen back in office if not improved by Monday.

## 2014-03-03 NOTE — Telephone Encounter (Signed)
Caller name: Marten Relation to pt: self Call back number: [phone] Pharmacy: [facility] on [address]  Reason for call:   Patient states that he is not feeling any better since last visit. He says that the congestion is not clearing up.

## 2014-03-05 DIAGNOSIS — J069 Acute upper respiratory infection, unspecified: Secondary | ICD-10-CM | POA: Insufficient documentation

## 2014-03-05 DIAGNOSIS — B9789 Other viral agents as the cause of diseases classified elsewhere: Secondary | ICD-10-CM

## 2014-03-05 NOTE — Assessment & Plan Note (Signed)
Advised increase rest, fluids, call Friday Am if symptoms worsen or if not improved.   Addendum: see phone note 12/18- pt reported no improvement in his symptoms, abx called in to pharmacy.

## 2014-03-07 ENCOUNTER — Ambulatory Visit (INDEPENDENT_AMBULATORY_CARE_PROVIDER_SITE_OTHER): Payer: Medicare Other | Admitting: Pharmacist Clinician (PhC)/ Clinical Pharmacy Specialist

## 2014-03-07 DIAGNOSIS — I4891 Unspecified atrial fibrillation: Secondary | ICD-10-CM

## 2014-03-07 DIAGNOSIS — Z7901 Long term (current) use of anticoagulants: Secondary | ICD-10-CM

## 2014-03-07 LAB — POCT INR: INR: 2.5

## 2014-03-08 ENCOUNTER — Other Ambulatory Visit: Payer: Self-pay | Admitting: Family

## 2014-03-08 NOTE — Telephone Encounter (Signed)
Rx called to pharmacy voicemail. 

## 2014-03-08 NOTE — Telephone Encounter (Signed)
OK to call in refill please.

## 2014-03-20 ENCOUNTER — Other Ambulatory Visit: Payer: Self-pay | Admitting: Family

## 2014-03-20 ENCOUNTER — Encounter (HOSPITAL_COMMUNITY): Payer: Self-pay | Admitting: Cardiology

## 2014-03-20 ENCOUNTER — Emergency Department (HOSPITAL_COMMUNITY)
Admission: EM | Admit: 2014-03-20 | Discharge: 2014-03-20 | Disposition: A | Discharge status: Home / Self Care | Payer: Medicare Other | Attending: Emergency Medicine | Admitting: Emergency Medicine

## 2014-03-20 DIAGNOSIS — Z7901 Long term (current) use of anticoagulants: Secondary | ICD-10-CM | POA: Insufficient documentation

## 2014-03-20 DIAGNOSIS — I10 Essential (primary) hypertension: Secondary | ICD-10-CM | POA: Diagnosis not present

## 2014-03-20 DIAGNOSIS — M79672 Pain in left foot: Secondary | ICD-10-CM | POA: Diagnosis not present

## 2014-03-20 DIAGNOSIS — Z792 Long term (current) use of antibiotics: Secondary | ICD-10-CM | POA: Diagnosis not present

## 2014-03-20 DIAGNOSIS — Z951 Presence of aortocoronary bypass graft: Secondary | ICD-10-CM | POA: Diagnosis not present

## 2014-03-20 DIAGNOSIS — Z8673 Personal history of transient ischemic attack (TIA), and cerebral infarction without residual deficits: Secondary | ICD-10-CM | POA: Diagnosis not present

## 2014-03-20 DIAGNOSIS — R05 Cough: Secondary | ICD-10-CM | POA: Insufficient documentation

## 2014-03-20 DIAGNOSIS — R0981 Nasal congestion: Secondary | ICD-10-CM | POA: Insufficient documentation

## 2014-03-20 DIAGNOSIS — G8929 Other chronic pain: Secondary | ICD-10-CM | POA: Insufficient documentation

## 2014-03-20 DIAGNOSIS — Z87891 Personal history of nicotine dependence: Secondary | ICD-10-CM | POA: Insufficient documentation

## 2014-03-20 DIAGNOSIS — G4733 Obstructive sleep apnea (adult) (pediatric): Secondary | ICD-10-CM | POA: Diagnosis not present

## 2014-03-20 DIAGNOSIS — Z7951 Long term (current) use of inhaled steroids: Secondary | ICD-10-CM | POA: Diagnosis not present

## 2014-03-20 DIAGNOSIS — Z9861 Coronary angioplasty status: Secondary | ICD-10-CM | POA: Insufficient documentation

## 2014-03-20 DIAGNOSIS — Z9981 Dependence on supplemental oxygen: Secondary | ICD-10-CM | POA: Insufficient documentation

## 2014-03-20 DIAGNOSIS — M25572 Pain in left ankle and joints of left foot: Secondary | ICD-10-CM | POA: Diagnosis not present

## 2014-03-20 DIAGNOSIS — M109 Gout, unspecified: Secondary | ICD-10-CM | POA: Insufficient documentation

## 2014-03-20 DIAGNOSIS — Z79899 Other long term (current) drug therapy: Secondary | ICD-10-CM | POA: Diagnosis not present

## 2014-03-20 DIAGNOSIS — I4891 Unspecified atrial fibrillation: Secondary | ICD-10-CM | POA: Insufficient documentation

## 2014-03-20 DIAGNOSIS — K219 Gastro-esophageal reflux disease without esophagitis: Secondary | ICD-10-CM | POA: Insufficient documentation

## 2014-03-20 LAB — CBC WITH DIFFERENTIAL/PLATELET
Basophils Absolute: 0.1 10*3/uL (ref 0.0–0.1)
Basophils Relative: 2 % — ABNORMAL HIGH (ref 0–1)
EOS ABS: 0.3 10*3/uL (ref 0.0–0.7)
Eosinophils Relative: 6 % — ABNORMAL HIGH (ref 0–5)
HCT: 39.5 % (ref 39.0–52.0)
HEMOGLOBIN: 12.8 g/dL — AB (ref 13.0–17.0)
LYMPHS ABS: 1.7 10*3/uL (ref 0.7–4.0)
Lymphocytes Relative: 38 % (ref 12–46)
MCH: 27.5 pg (ref 26.0–34.0)
MCHC: 32.4 g/dL (ref 30.0–36.0)
MCV: 84.9 fL (ref 78.0–100.0)
Monocytes Absolute: 0.4 10*3/uL (ref 0.1–1.0)
Monocytes Relative: 10 % (ref 3–12)
Neutro Abs: 2 10*3/uL (ref 1.7–7.7)
Neutrophils Relative %: 44 % (ref 43–77)
Platelets: 159 10*3/uL (ref 150–400)
RBC: 4.65 MIL/uL (ref 4.22–5.81)
RDW: 13.7 % (ref 11.5–15.5)
WBC: 4.4 10*3/uL (ref 4.0–10.5)

## 2014-03-20 LAB — BASIC METABOLIC PANEL
ANION GAP: 7 (ref 5–15)
BUN: 14 mg/dL (ref 6–23)
CALCIUM: 9.1 mg/dL (ref 8.4–10.5)
CO2: 28 mmol/L (ref 19–32)
Chloride: 104 mEq/L (ref 96–112)
Creatinine, Ser: 1.1 mg/dL (ref 0.50–1.35)
GFR calc non Af Amer: 66 mL/min — ABNORMAL LOW (ref >90)
GFR, EST AFRICAN AMERICAN: 77 mL/min — AB (ref >90)
Glucose, Bld: 129 mg/dL — ABNORMAL HIGH (ref 70–99)
Potassium: 3.6 mmol/L (ref 3.5–5.1)
Sodium: 139 mmol/L (ref 135–145)

## 2014-03-20 LAB — PROTIME-INR
INR: 2.42 — AB (ref 0.00–1.49)
Prothrombin Time: 26.5 seconds — ABNORMAL HIGH (ref 11.6–15.2)

## 2014-03-20 MED ORDER — ACETAMINOPHEN 325 MG PO TABS
650.0000 mg | ORAL_TABLET | Freq: Once | ORAL | Status: AC
  Administered 2014-03-20: 650 mg via ORAL
  Filled 2014-03-20: qty 2

## 2014-03-20 NOTE — Discharge Instructions (Signed)
Pain of Unknown Etiology (Pain Without a Known Cause) °You have come to your caregiver because of pain. Pain can occur in any part of the body. Often there is not a definite cause. If your laboratory (blood or urine) work was normal and X-rays or other studies were normal, your caregiver may treat you without knowing the cause of the pain. An example of this is the headache. Most headaches are diagnosed by taking a history. This means your caregiver asks you questions about your headaches. Your caregiver determines a treatment based on your answers. Usually testing done for headaches is normal. Often testing is not done unless there is no response to medications. Regardless of where your pain is located today, you can be given medications to make you comfortable. If no physical cause of pain can be found, most cases of pain will gradually leave as suddenly as they came.  °If you have a painful condition and no reason can be found for the pain, it is important that you follow up with your caregiver. If the pain becomes worse or does not go away, it may be necessary to repeat tests and look further for a possible cause. °· Only take over-the-counter or prescription medicines for pain, discomfort, or fever as directed by your caregiver. °· For the protection of your privacy, test results cannot be given over the phone. Make sure you receive the results of your test. Ask how these results are to be obtained if you have not been informed. It is your responsibility to obtain your test results. °· You may continue all activities unless the activities cause more pain. When the pain lessens, it is important to gradually resume normal activities. Resume activities by beginning slowly and gradually increasing the intensity and duration of the activities or exercise. During periods of severe pain, bed rest may be helpful. Lie or sit in any position that is comfortable. °· Ice used for acute (sudden) conditions may be effective.  Use a large plastic bag filled with ice and wrapped in a towel. This may provide pain relief. °· See your caregiver for continued problems. Your caregiver can help or refer you for exercises or physical therapy if necessary. °If you were given medications for your condition, do not drive, operate machinery or power tools, or sign legal documents for 24 hours. Do not drink alcohol, take sleeping pills, or take other medications that may interfere with treatment. °See your caregiver immediately if you have pain that is becoming worse and not relieved by medications. °Document Released: 11/26/2000 Document Revised: 12/22/2012 Document Reviewed: 03/03/2005 °ExitCare® Patient Information ©2015 [employer]. This information is not intended to replace advice given to you by your health care provider. Make sure you discuss any questions you have with your health care provider. ° °

## 2014-03-20 NOTE — ED Notes (Signed)
Dr. Doy Mince back at the bedside.

## 2014-03-20 NOTE — ED Notes (Signed)
Doctor wofford at the bedside

## 2014-03-20 NOTE — ED Notes (Signed)
Patient ambulated to restroom and tolerated well.  

## 2014-03-20 NOTE — ED Provider Notes (Addendum)
CSN: [id number]     Arrival date & time 03/20/14  8144 History   First MD Initiated Contact with Patient 03/20/14 [id number]     Chief Complaint  Patient presents with  . Cellulitis  . Cough  . Nasal Congestion     (Consider location/radiation/quality/duration/timing/severity/associated sxs/prior Treatment) Patient is a 69 y.o. male presenting with leg pain.  Leg Pain Location:  Ankle and foot Time since incident:  2 days Injury: no   Ankle location:  L ankle Foot location:  Dorsum of L foot Pain details:    Quality:  Throbbing   Radiates to:  Does not radiate   Severity:  Moderate   Onset quality:  Gradual   Duration:  2 days   Timing:  Intermittent   Progression:  Unchanged Relieved by:  Elevation Worsened by:  Nothing tried Associated symptoms: no decreased ROM, no fever and no swelling   Associated symptoms comment:  Had a URI several weeks ago, finished a course of cefdinir.  Symptoms much improved.    Past Medical History  Diagnosis Date  . GERD (gastroesophageal reflux disease)   . Gout   . Hyperlipidemia   . Hypertension   . CVA (cerebral infarction)     left hemiparesis  . Chronic pain     left sided-Kristeins  . CAD (coronary artery disease)     s/p CABG, s/p DES to LCX  January 2011  . OSA on CPAP   . Sleep apnea   . History of colonoscopy   . Stroke   . Dysrhythmia     hx of atrial fibrilation with cardioversion   Past Surgical History  Procedure Laterality Date  . Coronary artery bypass graft      stent  . Esophagogastroduodenoscopy  03-25-2005  . Nasal septum surgery    . Percutaneous placement intravascular stent cervical carotid artery      03-2009; using a drug-eluting platform of the circumflex cornoray artery with a 3.0 x 18 [employer] Promus drug-eluting platform post dilated to 3.75 with a noncompliant balloon.  . Cataract extraction  05/2010    left eye  . Cataract extraction  04/2010    right eye  . Cardioversion  06/20/2011     Procedure: CARDIOVERSION;  Surgeon: Larey Dresser, MD;  Location: [facility] ENDOSCOPY;  Service: Cardiovascular;  Laterality: N/A;  . Tee without cardioversion  06/20/2011    Procedure: TRANSESOPHAGEAL ECHOCARDIOGRAM (TEE);  Surgeon: Larey Dresser, MD;  Location: [facility] ENDOSCOPY;  Service: Cardiovascular;  Laterality: N/A;   Family History  Problem Relation Age of Onset  . Lung cancer Father     deceased  . Stroke Mother     deceased-MINISTROKES   History  Substance Use Topics  . Smoking status: Former Research scientist (life sciences)  . Smokeless tobacco: Never Used  . Alcohol Use: Yes     Comment: 1 beer a month    Review of Systems  Constitutional: Negative for fever.  All other systems reviewed and are negative.     Allergies  Review of patient's allergies indicates no known allergies.  Home Medications   Prior to Admission medications   Medication Sig Start Date End Date Taking? Authorizing Provider  allopurinol (ZYLOPRIM) 100 MG tablet Take 2 tablets (200 mg total) by mouth daily. 09/19/13  Yes Debbrah Alar, NP  benazepril (LOTENSIN) 10 MG tablet Take 1 tablet (10 mg total) by mouth daily. 09/19/13  Yes Debbrah Alar, NP  CRESTOR 40 MG tablet TAKE 1 TABLET BY MOUTH EVERY DAY  01/23/14  Yes Debbrah Alar, NP  diltiazem (CARDIZEM CD) 180 MG 24 hr capsule TAKE 1 CAPSULE (180 MG TOTAL) BY MOUTH DAILY. 11/28/13  Yes Debbrah Alar, NP  furosemide (LASIX) 40 MG tablet TAKE 1 TABLET (40 MG TOTAL) BY MOUTH EVERY OTHER DAY.   Yes Debbrah Alar, NP  levothyroxine (SYNTHROID, LEVOTHROID) 112 MCG tablet Take 1 tablet (112 mcg total) by mouth daily before breakfast. 09/19/13  Yes Debbrah Alar, NP  metFORMIN (GLUCOPHAGE) 500 MG tablet TAKE 500 MG BY MOUTH DAILY WITH BREAKFAST. 01/23/14  Yes Debbrah Alar, NP  nitroGLYCERIN (NITROSTAT) 0.4 MG SL tablet Place 0.4 mg under the tongue every 5 (five) minutes as needed for chest pain. Up to 3 doses   Yes Historical Provider, MD  pantoprazole  (PROTONIX) 40 MG tablet TAKE 1 TABLET (40 MG TOTAL) BY MOUTH DAILY.   Yes Debbrah Alar, NP  traMADol (ULTRAM) 50 MG tablet TAKE 1 TABLET TWICE A DAY 11/22/13  Yes Charlett Blake, MD  traMADol (ULTRAM-ER) 200 MG 24 hr tablet TAKE 1 TABLET EVERY DAY WITH AM DOSE OF TRAMADOL 50 MG 11/22/13  Yes Charlett Blake, MD  vitamin B-12 1000 MCG tablet Take 1 tablet (1,000 mcg total) by mouth daily. 08/11/13  Yes Orson Eva, MD  warfarin (COUMADIN) 2.5 MG tablet Take 1.25-2.5 mg by mouth daily. Take 1.25mg  by mouth on Tues and Sat. Take 2.5mg  by mouth all other days   Yes Historical Provider, MD  cefdinir (OMNICEF) 300 MG capsule Take 1 capsule (300 mg total) by mouth 2 (two) times daily. Patient not taking: Reported on 03/20/2014 03/03/14   Debbrah Alar, NP  CHERATUSSIN AC 100-10 MG/5ML syrup TAKE 10 MLS BY MOUTH EVERY 6 HOURS AS NEEDED FOR COUGH Patient not taking: Reported on 03/20/2014 03/08/14   Debbrah Alar, NP  fluticasone (FLONASE) 50 MCG/ACT nasal spray Place 2 sprays into both nostrils daily. Patient not taking: Reported on 03/20/2014 08/11/13   Orson Eva, MD  loratadine (CLARITIN) 10 MG tablet Take 1 tablet (10 mg total) by mouth daily. Patient not taking: Reported on 03/20/2014 08/11/13   Orson Eva, MD   BP 137/74 mmHg  Pulse 67  Temp(Src) 97.7 F (36.5 C) (Oral)  Resp 20  Ht 6\' 3"  (1.905 m)  Wt 260 lb (117.935 kg)  BMI 32.50 kg/m2  SpO2 96% Physical Exam  Constitutional: He is oriented to person, place, and time. He appears well-developed and well-nourished. No distress.  HENT:  Head: Normocephalic and atraumatic.  Mouth/Throat: Oropharynx is clear and moist.  Eyes: Conjunctivae are normal. Pupils are equal, round, and reactive to light. No scleral icterus.  Neck: Neck supple.  Cardiovascular: Normal rate, regular rhythm, normal heart sounds and intact distal pulses.   No murmur heard. Pulmonary/Chest: Effort normal and breath sounds normal. No stridor. No respiratory  distress. He has no wheezes. He has no rales.  Abdominal: Soft. He exhibits no distension. There is no tenderness.  Musculoskeletal: Normal range of motion. He exhibits no edema.  Neurological: He is alert and oriented to person, place, and time.  Skin: Skin is warm and dry. No rash noted.  Left foot with no redness or warmth.  No streaking.  2+ DP pulses.  1+ pitting edema of foot.    Psychiatric: He has a normal mood and affect. His behavior is normal.  Nursing note and vitals reviewed.   ED Course  Procedures (including critical care time) Labs Review Labs Reviewed  CBC WITH DIFFERENTIAL - Abnormal; Notable for  the following:    Hemoglobin 12.8 (*)    Eosinophils Relative 6 (*)    Basophils Relative 2 (*)    All other components within normal limits  BASIC METABOLIC PANEL - Abnormal; Notable for the following:    Glucose, Bld 129 (*)    GFR calc non Af Amer 66 (*)    GFR calc Af Amer 77 (*)    All other components within normal limits  PROTIME-INR - Abnormal; Notable for the following:    Prothrombin Time 26.5 (*)    INR 2.42 (*)    All other components within normal limits    Imaging Review No results found.   EKG Interpretation None      MDM   Final diagnoses:  Left foot pain    69 yo male who presented to the ED with concerns that he has cellulitis in his left leg.  He has had intermittent throbbing in his left foot and ankle for two days, worse at night.  His exam is not consistent with cellulitis at this time.  He has no fevers or leukocytosis.  I don't think antibiotics are indicated.  However, I advised he follow up with his PCP if his symptoms worsen or if he develops redness.  Regarding his pain, he denies trauma.  His INR is therapeutic, so DVT very unlikely.  He does have edema, but states this is chronic.  He appears stable for DC home.      Arbie Cookey, MD 03/20/14 [place], MD 03/20/14 [id number]

## 2014-03-20 NOTE — ED Notes (Signed)
Pt reports that he has had a cough and congestion for the past couple of days. States that he has had cellulitis in that past and been treated with antibiotics. Reports the redness is in the left leg at this time.

## 2014-03-21 ENCOUNTER — Telehealth: Payer: Self-pay | Admitting: *Deleted

## 2014-03-21 NOTE — Telephone Encounter (Signed)
Pt called regarding ER visit on 03/20/14 due to warmth of his left leg. Review ER note / recommendations with pt. Confirmed with pt that symptoms have not worsened. Leg continues to feel warm but is not red and edema is at base line per pt. Pt is anxious because he "felt certain that he is getting cellulitis". Doesn't understand why leg continues to feel warm to touch. Pt denies any new symptoms and wanted to know your recommendations.  Please advise.

## 2014-03-21 NOTE — Telephone Encounter (Signed)
Rx request to pharmacy/SLS  

## 2014-03-22 NOTE — Telephone Encounter (Signed)
He should be re-evaluated in the office for ED follow up.

## 2014-03-22 NOTE — Telephone Encounter (Signed)
Spoke with pt and scheduled him for follow up on Friday at 8:15am.

## 2014-03-24 ENCOUNTER — Ambulatory Visit (INDEPENDENT_AMBULATORY_CARE_PROVIDER_SITE_OTHER): Payer: Medicare Other | Admitting: Family

## 2014-03-24 ENCOUNTER — Encounter: Payer: Self-pay | Admitting: Family

## 2014-03-24 VITALS — BP 132/74 | HR 76 | Temp 98.4°F | Resp 18 | Ht 75.0 in | Wt 265.2 lb

## 2014-03-24 DIAGNOSIS — E039 Hypothyroidism, unspecified: Secondary | ICD-10-CM

## 2014-03-24 DIAGNOSIS — E119 Type 2 diabetes mellitus without complications: Secondary | ICD-10-CM | POA: Diagnosis not present

## 2014-03-24 DIAGNOSIS — G8110 Spastic hemiplegia affecting unspecified side: Secondary | ICD-10-CM

## 2014-03-24 LAB — TSH: TSH: 0.81 u[IU]/mL (ref 0.35–4.50)

## 2014-03-24 LAB — HEMOGLOBIN A1C: Hgb A1c MFr Bld: 7 % — ABNORMAL HIGH (ref 4.6–6.5)

## 2014-03-24 NOTE — Patient Instructions (Signed)
Please complete your lab work prior to leaving. Keep your upcoming appointment with Dr. Letta Pate. Follow up in 3-4 months.

## 2014-03-24 NOTE — Assessment & Plan Note (Signed)
I think that his LLE symptoms are related to his hx of spastic hemiplegia and possible peripheral neuropathy symptoms. No sign of infection.  Advised pt to keep his upcoming appointment with Dr. Letta Pate for his botox injection.

## 2014-03-24 NOTE — Progress Notes (Signed)
Subjective:    Patient ID: [name], male    DOB: Jan 31, 1946, 69 y.o.   MRN: [id number]  HPI  [name] is a 69 yr old male who presents today for ER follow up. ER notes are reviewed. He was evaluated on 03/20/14 with chief complaint of left sided leg pain. He had associated throbbing at that time. It was felt that his exam was not consistent with cellulitis which was pt's primary concern at that time. INR was therapeutic at that time so DVT was felt to be unlikely. Pt contacted our office on 03/21/14 reporting ongoing warmth.  Today he reports symptoms have improved.  He does follow with Dr. Letta Pate who has been following him for botox injections of the left leg and the left arm for left sided spastic hemiplegia. Reports that he generally feels better x 2.5 months following his botox injections. Towards the need of the cycle, he reports that his gait is more disturbed.   Review of Systems See HPI  Past Medical History  Diagnosis Date  . GERD (gastroesophageal reflux disease)   . Gout   . Hyperlipidemia   . Hypertension   . CVA (cerebral infarction)     left hemiparesis  . Chronic pain     left sided-Kristeins  . CAD (coronary artery disease)     s/p CABG, s/p DES to LCX  January 2011  . OSA on CPAP   . Sleep apnea   . History of colonoscopy   . Stroke   . Dysrhythmia     hx of atrial fibrilation with cardioversion    History   Social History  . Marital Status: Married    Spouse Name: [name]    Number of Children: N/A  . Years of Education: N/A   Occupational History  . retired Hydrographic surveyor   Social History Main Topics  . Smoking status: Former Research scientist (life sciences)  . Smokeless tobacco: Never Used  . Alcohol Use: Yes     Comment: 1 beer a month  . Drug Use: No  . Sexual Activity: No   Other Topics Concern  . Not on file   Social History Narrative    Past Surgical History  Procedure Laterality Date  . Coronary artery bypass graft      stent  .  Esophagogastroduodenoscopy  03-25-2005  . Nasal septum surgery    . Percutaneous placement intravascular stent cervical carotid artery      03-2009; using a drug-eluting platform of the circumflex cornoray artery with a 3.0 x 18 [employer] Promus drug-eluting platform post dilated to 3.75 with a noncompliant balloon.  . Cataract extraction  05/2010    left eye  . Cataract extraction  04/2010    right eye  . Cardioversion  06/20/2011    Procedure: CARDIOVERSION;  Surgeon: Larey Dresser, MD;  Location: [facility] ENDOSCOPY;  Service: Cardiovascular;  Laterality: N/A;  . Tee without cardioversion  06/20/2011    Procedure: TRANSESOPHAGEAL ECHOCARDIOGRAM (TEE);  Surgeon: Larey Dresser, MD;  Location: [facility] ENDOSCOPY;  Service: Cardiovascular;  Laterality: N/A;    Family History  Problem Relation Age of Onset  . Lung cancer Father     deceased  . Stroke Mother     deceased-MINISTROKES    No Known Allergies  Current Outpatient Prescriptions on File Prior to Visit  Medication Sig Dispense Refill  . allopurinol (ZYLOPRIM) 100 MG tablet Take 2 tablets (200 mg total) by mouth daily. 180 tablet 1  .  benazepril (LOTENSIN) 10 MG tablet Take 1 tablet (10 mg total) by mouth daily. 90 tablet 1  . CRESTOR 40 MG tablet TAKE 1 TABLET BY MOUTH EVERY DAY 90 tablet 1  . diltiazem (CARDIZEM CD) 180 MG 24 hr capsule TAKE 1 CAPSULE (180 MG TOTAL) BY MOUTH DAILY. 90 capsule 1  . fluticasone (FLONASE) 50 MCG/ACT nasal spray Place 2 sprays into both nostrils daily. 9.9 g 1  . furosemide (LASIX) 40 MG tablet TAKE 1 TABLET (40 MG TOTAL) BY MOUTH EVERY OTHER DAY. 45 tablet 1  . levothyroxine (SYNTHROID, LEVOTHROID) 112 MCG tablet TAKE 1 TABLET (112 MCG TOTAL) BY MOUTH DAILY BEFORE BREAKFAST. 90 tablet 0  . loratadine (CLARITIN) 10 MG tablet Take 1 tablet (10 mg total) by mouth daily. 30 tablet 0  . metFORMIN (GLUCOPHAGE) 500 MG tablet TAKE 500 MG BY MOUTH DAILY WITH BREAKFAST. 90 tablet 1  . nitroGLYCERIN (NITROSTAT) 0.4  MG SL tablet Place 0.4 mg under the tongue every 5 (five) minutes as needed for chest pain. Up to 3 doses    . pantoprazole (PROTONIX) 40 MG tablet TAKE 1 TABLET (40 MG TOTAL) BY MOUTH DAILY. 90 tablet 1  . traMADol (ULTRAM) 50 MG tablet TAKE 1 TABLET TWICE A DAY 180 tablet 1  . traMADol (ULTRAM-ER) 200 MG 24 hr tablet TAKE 1 TABLET EVERY DAY WITH AM DOSE OF TRAMADOL 50 MG 90 tablet 1  . vitamin B-12 1000 MCG tablet Take 1 tablet (1,000 mcg total) by mouth daily. 30 tablet 0  . warfarin (COUMADIN) 2.5 MG tablet Take 1.25-2.5 mg by mouth daily. Take 1.25mg  by mouth on Tues and Sat. Take 2.5mg  by mouth all other days     No current facility-administered medications on file prior to visit.    BP 132/74 mmHg  Pulse 76  Temp(Src) 98.4 F (36.9 C) (Oral)  Resp 18  Ht 6\' 3"  (1.905 m)  Wt 265 lb 3.2 oz (120.294 kg)  BMI 33.15 kg/m2  SpO2 95%       Objective:   Physical Exam  Constitutional: He is oriented to person, place, and time. He appears well-developed and well-nourished. No distress.  HENT:  Head: Normocephalic and atraumatic.  Cardiovascular: Normal rate and regular rhythm.   No murmur heard. Pulmonary/Chest: Effort normal and breath sounds normal. No respiratory distress. He has no wheezes. He has no rales. He exhibits no tenderness.  Musculoskeletal:  LLE is without swelling or tenderness  Neurological: He is alert and oriented to person, place, and time.  LUE/LLE strength 4-5/5 RUE/RLE strength is 5/5  Skin: Skin is warm and dry.  No erythema noted of the LLE  Psychiatric: He has a normal mood and affect. His behavior is normal. Judgment and thought content normal.          Assessment & Plan:

## 2014-03-24 NOTE — Assessment & Plan Note (Signed)
Will obtain follow up a1c.

## 2014-03-24 NOTE — Addendum Note (Signed)
Addended by: Peggyann Shoals on: 03/24/2014 08:40 AM   Modules accepted: Orders

## 2014-03-24 NOTE — Progress Notes (Signed)
Pre visit review using our clinic review tool, if applicable. No additional management support is needed unless otherwise documented below in the visit note. 

## 2014-03-24 NOTE — Assessment & Plan Note (Signed)
Continue synthroid, obtain tsh.

## 2014-04-03 ENCOUNTER — Ambulatory Visit (HOSPITAL_BASED_OUTPATIENT_CLINIC_OR_DEPARTMENT_OTHER): Payer: Medicare Other | Admitting: Physical Medicine & Rehabilitation

## 2014-04-03 ENCOUNTER — Encounter: Payer: Self-pay | Admitting: Physical Medicine & Rehabilitation

## 2014-04-03 ENCOUNTER — Encounter: Payer: Medicare Other | Attending: Physical Medicine & Rehabilitation

## 2014-04-03 DIAGNOSIS — G8114 Spastic hemiplegia affecting left nondominant side: Secondary | ICD-10-CM | POA: Diagnosis not present

## 2014-04-03 DIAGNOSIS — G8110 Spastic hemiplegia affecting unspecified side: Secondary | ICD-10-CM | POA: Diagnosis not present

## 2014-04-03 NOTE — Progress Notes (Signed)
Dysport Injection for spasticity using needle EMG guidance and E stim  Dilution: 50 Units/ml  Indication: Severe spasticity which interferes with ADL,mobility and/or hygiene and is unresponsive to medication management and other conservative care  Informed consent was obtained after describing risks and benefits of the procedure with the patient. This includes bleeding, bruising, infection, excessive weakness, or medication side effects. A REMS form is on file and signed.  Needle: 27g 1" needle electrode  Number of units per muscle   Biceps 400U FCR 100U  FDS 100U  Hamstrings 400U  All injections were done after obtaining appropriate EMG activity and E stinm and after negative drawback for blood. The patient tolerated the procedure well. Post procedure instructions were given. A followup appointment was made.

## 2014-04-03 NOTE — Patient Instructions (Signed)
You received a Dysport injection today. You may experience soreness at the needle injection sites. Please call us if any of the injection sites turns red after a couple days or if there is any drainage. You may experience muscle weakness as a result of Botox. This would improve with time but can take several weeks to improve. The Dysport should start working in about one week. The Dysport  usually last 3-4 months. The injection can be repeated every 3 months as needed.

## 2014-04-04 ENCOUNTER — Other Ambulatory Visit: Payer: Self-pay | Admitting: Family

## 2014-04-06 ENCOUNTER — Ambulatory Visit (INDEPENDENT_AMBULATORY_CARE_PROVIDER_SITE_OTHER): Payer: Medicare Other | Admitting: *Deleted

## 2014-04-06 DIAGNOSIS — I4891 Unspecified atrial fibrillation: Secondary | ICD-10-CM | POA: Diagnosis not present

## 2014-04-06 DIAGNOSIS — Z7901 Long term (current) use of anticoagulants: Secondary | ICD-10-CM | POA: Diagnosis not present

## 2014-04-06 LAB — POCT INR: INR: 2.1

## 2014-04-11 ENCOUNTER — Other Ambulatory Visit: Payer: Self-pay | Admitting: *Deleted

## 2014-04-11 ENCOUNTER — Other Ambulatory Visit: Payer: Self-pay | Admitting: Cardiology

## 2014-04-25 ENCOUNTER — Ambulatory Visit (INDEPENDENT_AMBULATORY_CARE_PROVIDER_SITE_OTHER): Payer: Medicare Other | Admitting: *Deleted

## 2014-04-25 DIAGNOSIS — Z7901 Long term (current) use of anticoagulants: Secondary | ICD-10-CM | POA: Diagnosis not present

## 2014-04-25 DIAGNOSIS — I4891 Unspecified atrial fibrillation: Secondary | ICD-10-CM | POA: Diagnosis not present

## 2014-04-25 LAB — POCT INR: INR: 2.3

## 2014-05-04 DIAGNOSIS — L603 Nail dystrophy: Secondary | ICD-10-CM | POA: Diagnosis not present

## 2014-05-04 DIAGNOSIS — I739 Peripheral vascular disease, unspecified: Secondary | ICD-10-CM | POA: Diagnosis not present

## 2014-05-04 DIAGNOSIS — L84 Corns and callosities: Secondary | ICD-10-CM | POA: Diagnosis not present

## 2014-05-15 ENCOUNTER — Other Ambulatory Visit: Payer: Self-pay | Admitting: *Deleted

## 2014-05-15 ENCOUNTER — Other Ambulatory Visit: Payer: Self-pay | Admitting: Physical Medicine & Rehabilitation

## 2014-05-15 MED ORDER — TRAMADOL HCL 50 MG PO TABS: 50.0000 mg | ORAL_TABLET | Freq: Four times a day (QID) | ORAL | Status: DC | PRN

## 2014-05-15 NOTE — Telephone Encounter (Signed)
Recd electronic refill request for Tramadol 50 mg.  Called into [facility]

## 2014-05-19 ENCOUNTER — Ambulatory Visit: Payer: Medicare Other | Admitting: Cardiology

## 2014-05-19 ENCOUNTER — Ambulatory Visit (INDEPENDENT_AMBULATORY_CARE_PROVIDER_SITE_OTHER): Payer: Medicare Other | Admitting: Cardiology

## 2014-05-19 ENCOUNTER — Encounter: Payer: Self-pay | Admitting: Cardiology

## 2014-05-19 VITALS — BP 138/82 | HR 76 | Ht 75.0 in | Wt 268.8 lb

## 2014-05-19 DIAGNOSIS — I2510 Atherosclerotic heart disease of native coronary artery without angina pectoris: Secondary | ICD-10-CM | POA: Diagnosis not present

## 2014-05-19 DIAGNOSIS — I4891 Unspecified atrial fibrillation: Secondary | ICD-10-CM | POA: Diagnosis not present

## 2014-05-19 NOTE — Patient Instructions (Signed)
Your physician recommends that you continue on your current medications as directed. Please refer to the Current Medication list given to you today.    Your physician wants you to follow-up in: [name] will receive a reminder letter in the mail two months in advance. If you don't receive a letter, please call our office to schedule the follow-up appointment.

## 2014-05-21 NOTE — Progress Notes (Signed)
Patient ID: [name], male   DOB: Dec 18, 1945, 69 y.o.   MRN: [id number]  PCP: Inda Castle  69 yo with history of CAD s/p CABG and paroxysmal atrial fibrillation with history of prior CVA returns for followup.  He remains in NSR today. Lexiscan myoview in 8/13 showed no ischemia and normal EF.  He has residual gait difficulty because of his stroke. No chest pain, no dyspnea, no bendopnea/orthopnea/PND.  He has not felt his heart racing.  He does not get much exercise.  Doing ok with warfarin, no BRBPR or melena.  He has had a hard year, son was diagnosed with ALS.    Labs (4/13): K 5, creatinine 1.11, LDL 32, HDL 30 Labs (8/13): HCT 39.1, TSH normal Labs (7/14): LDL 43, HDL 46 Labs (8/14): K 4.4, creatinine 1.19 Labs (9/14): TSH normal, HCT 41.9 Labs (5/15): LDL 43, HDL 40 Labs (1/16): K 3.6, creatinine 1.10  ECG: NSR, LVH with left axis  PMH: 1. GERD 2. Gout 3. Hypothyroidism 4. HTN 5. H/o CVA: some residual left-sided weakness.  6. Borderline DM2 7. CAD s/p CABG in 2004.  LHC in 2011: SVG-RCA and SVG-OM totally occluded.  LIMA-LAD patent.  Patient had DES to CFX, flow down native RCA was ok.  Lexiscan myoview (8/13): EF 62%, small mild fixed inferior defect with no ischemia.  Echo (9/14) with EF 55-60%, grade II diastolic dysfunction, PA systolic pressure 35 mmHg.   8. OSA: Intolerant of CPAP, wears oxygen at night.  9. Paroxysmal atrial fibrillation. TEE (4/13) with EF 55-60%, no significant valvular abnormalities.  10. 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  Nonsmoker.  Lives in [place].    FH: No premature CAD.   ROS: All systems reviewed and negative except as per HPI.   Current Outpatient Prescriptions  Medication Sig Dispense Refill  . allopurinol (ZYLOPRIM) 100 MG tablet TAKE 2 TABLETS BY MOUTH DAILY**INS WILL PAY ON 7/18** 180 tablet 1  . benazepril (LOTENSIN) 10 MG tablet TAKE 1  TABLET BY MOUTH DAILY** INS TO PAY ON 7/24** 90 tablet 1  . CRESTOR 40 MG tablet TAKE 1 TABLET BY MOUTH EVERY DAY 90 tablet 1  . diltiazem (CARDIZEM CD) 180 MG 24 hr capsule TAKE 1 CAPSULE (180 MG TOTAL) BY MOUTH DAILY. 90 capsule 1  . fluticasone (FLONASE) 50 MCG/ACT nasal spray Place 2 sprays into both nostrils daily. 9.9 g 1  . furosemide (LASIX) 40 MG tablet TAKE 1 TABLET BY MOUTH EVERY OTHER DAY 45 tablet 1  . levothyroxine (SYNTHROID, LEVOTHROID) 112 MCG tablet TAKE 1 TABLET (112 MCG TOTAL) BY MOUTH DAILY BEFORE BREAKFAST. 90 tablet 0  . metFORMIN (GLUCOPHAGE) 500 MG tablet TAKE 500 MG BY MOUTH DAILY WITH BREAKFAST. 90 tablet 1  . nitroGLYCERIN (NITROSTAT) 0.4 MG SL tablet Place 0.4 mg under the tongue every 5 (five) minutes as needed for chest pain. Up to 3 doses    . pantoprazole (PROTONIX) 40 MG tablet TAKE 1 TABLET BY MOUTH DAILY 90 tablet 1  . traMADol (ULTRAM) 50 MG tablet Take 1 tablet (50 mg total) by mouth every 6 (six) hours as needed. 180 tablet 1  . traMADol (ULTRAM-ER) 200 MG 24 hr tablet TAKE 1 TABLET EVERY DAY WITH AM DOSE OF TRAMADOL 50 MG 90 tablet 1  . vitamin B-12 1000 MCG tablet Take 1 tablet (1,000 mcg total) by mouth daily. 30 tablet 0  . warfarin (COUMADIN) 2.5 MG tablet TAKE  AS DIRECTED BY ANTICOAGULATION CLINIC 100 tablet 0   No current facility-administered medications for this visit.    BP 138/82 mmHg  Pulse 76  Ht 6\' 3"  (1.905 m)  Wt 268 lb 12.8 oz (121.927 kg)  BMI 33.60 kg/m2 General: NAD, overweight Neck: Thick, no JVD, no thyromegaly or thyroid nodule.  Lungs: Clear to auscultation bilaterally with normal respiratory effort. CV: Nondisplaced PMI.  Heart regular S1/S2, no S3/S4, no murmur.  No edema.  No carotid bruit.  Normal pedal pulses.  Abdomen: Soft, nontender, no hepatosplenomegaly, no distention.  Neurologic: Alert and oriented x 3.  Psych: Normal affect. Extremities: No clubbing or cyanosis.   Assessment/Plan:  HYPERLIPIDEMIA  LDL at  goal in 5/15.   CAD No chest pain or exertional dyspnea. Lexiscan myoview in 8/13 with no ischemia, echo in 9/14 with normal EF. Continue ASA 81, statin, benazepril.  Atrial fibrillation  Paroxysmal atrial fibrillation. Patient had 1 episode prior to 4/13. The 4/13 episode was likely triggered by acute illness (flu). He is in NSR today. Continue diltiazem CD and warfarin.   Followup in 1 year.   [name] 05/21/2014

## 2014-05-24 ENCOUNTER — Other Ambulatory Visit: Payer: Self-pay | Admitting: Family

## 2014-06-02 ENCOUNTER — Encounter: Payer: Self-pay | Admitting: Physician Assistant

## 2014-06-02 ENCOUNTER — Ambulatory Visit (INDEPENDENT_AMBULATORY_CARE_PROVIDER_SITE_OTHER): Payer: Medicare Other | Admitting: Physician Assistant

## 2014-06-02 VITALS — BP 158/92 | HR 70 | Temp 98.0°F | Wt 263.0 lb

## 2014-06-02 DIAGNOSIS — J309 Allergic rhinitis, unspecified: Secondary | ICD-10-CM | POA: Diagnosis not present

## 2014-06-02 DIAGNOSIS — G8110 Spastic hemiplegia affecting unspecified side: Secondary | ICD-10-CM

## 2014-06-02 DIAGNOSIS — I2510 Atherosclerotic heart disease of native coronary artery without angina pectoris: Secondary | ICD-10-CM

## 2014-06-02 MED ORDER — FLUTICASONE PROPIONATE 50 MCG/ACT NA SUSP: 2.0000 | Freq: Every day | NASAL | Status: DC

## 2014-06-02 NOTE — Progress Notes (Signed)
Pre visit review using our clinic review tool, if applicable. No additional management support is needed unless otherwise documented below in the visit note. 

## 2014-06-02 NOTE — Assessment & Plan Note (Signed)
Followed by Neurology.  Weakness noted especially with L knee extension and L hip flexion.  Is ambulating ok without assistance.  Agrees to see PT.  Referral placed for evaluation and strengthening.

## 2014-06-02 NOTE — Assessment & Plan Note (Signed)
Resume Flonase.  Saline nasal spray.  Humidifier in bedroom.

## 2014-06-02 NOTE — Patient Instructions (Signed)
You will be contacted for evaluation by Physical Therapy to help strengthen your muscles and stabilize your gait. Please go to these appointments.  For the allergic rhinitis -- restart your Flonase daily.  Keep the humidifier in the bedroom.  Call or return to clinic if symptoms are not improving.

## 2014-06-02 NOTE — Progress Notes (Signed)
Patient with  presents to clinic today c/o continued gait instability s/p stroke.  Is followed by Neurology once yearly.  Has previously been referred to Physical therapy for this, but declined their services.  Endorses difficulty with gait and balance.  Denies lightheadedness, vision changes, facial dropping or other concerning symptoms.  Patient complains of nasal congestion and rhinorrhea that is mild but has been present for a few days.  Denies other URI symptoms.  Has + history of seasonal allergies.   Past Medical History  Diagnosis Date  . GERD (gastroesophageal reflux disease)   . Gout   . Hyperlipidemia   . Hypertension   . CVA (cerebral infarction)     left hemiparesis  . Chronic pain     left sided-Kristeins  . CAD (coronary artery disease)     s/p CABG, s/p DES to LCX  January 2011  . OSA on CPAP   . Sleep apnea   . History of colonoscopy   . Stroke   . Dysrhythmia     hx of atrial fibrilation with cardioversion    Current Outpatient Prescriptions on File Prior to Visit  Medication Sig Dispense Refill  . allopurinol (ZYLOPRIM) 100 MG tablet TAKE 2 TABLETS BY MOUTH DAILY**INS WILL PAY ON 7/18** 180 tablet 1  . benazepril (LOTENSIN) 10 MG tablet TAKE 1 TABLET BY MOUTH DAILY** INS TO PAY ON 7/24** 90 tablet 1  . CRESTOR 40 MG tablet TAKE 1 TABLET BY MOUTH EVERY DAY 90 tablet 1  . diltiazem (CARDIZEM CD) 180 MG 24 hr capsule TAKE 1 CAPSULE (180 MG TOTAL) BY MOUTH DAILY. 90 capsule 1  . furosemide (LASIX) 40 MG tablet TAKE 1 TABLET BY MOUTH EVERY OTHER DAY 45 tablet 1  . levothyroxine (SYNTHROID, LEVOTHROID) 112 MCG tablet TAKE 1 TABLET (112 MCG TOTAL) BY MOUTH DAILY BEFORE BREAKFAST. 90 tablet 0  . metFORMIN (GLUCOPHAGE) 500 MG tablet TAKE 500 MG BY MOUTH DAILY WITH BREAKFAST. 90 tablet 1  . nitroGLYCERIN (NITROSTAT) 0.4 MG SL tablet Place 0.4 mg under the tongue every 5 (five) minutes as needed for chest pain. Up to 3 doses    . pantoprazole (PROTONIX) 40 MG tablet TAKE  1 TABLET BY MOUTH DAILY 90 tablet 1  . traMADol (ULTRAM) 50 MG tablet Take 1 tablet (50 mg total) by mouth every 6 (six) hours as needed. 180 tablet 1  . traMADol (ULTRAM-ER) 200 MG 24 hr tablet TAKE 1 TABLET EVERY DAY WITH AM DOSE OF TRAMADOL 50 MG 90 tablet 1  . vitamin B-12 1000 MCG tablet Take 1 tablet (1,000 mcg total) by mouth daily. 30 tablet 0  . warfarin (COUMADIN) 2.5 MG tablet TAKE AS DIRECTED BY ANTICOAGULATION CLINIC 100 tablet 0   No current facility-administered medications on file prior to visit.    No Known Allergies  Family History  Problem Relation Age of Onset  . Lung cancer Father     deceased  . Stroke Mother     deceased-MINISTROKES    History   Social History  . Marital Status: Married    Spouse Name: [name]  . Number of Children: N/A  . Years of Education: N/A   Occupational History  . retired Hydrographic surveyor   Social History Main Topics  . Smoking status: Former Research scientist (life sciences)  . Smokeless tobacco: Never Used  . Alcohol Use: Yes     Comment: 1 beer a month  . Drug Use: No  . Sexual Activity: No  Other Topics Concern  . None   Social History Narrative    Review of Systems - See HPI.  All other ROS are negative.  BP 158/92 mmHg  Pulse 70  Temp(Src) 98 F (36.7 C)  Wt 263 lb (119.296 kg)  SpO2 97%  Physical Exam  Constitutional: He is oriented to person, place, and time and well-developed, well-nourished, and in no distress.  HENT:  Head: Normocephalic and atraumatic.  Right Ear: External ear normal.  Left Ear: External ear normal.  Nose: Nose normal.  Mouth/Throat: Oropharynx is clear and moist. No oropharyngeal exudate.  Cardiovascular: Normal rate, regular rhythm, normal heart sounds and intact distal pulses.   Pulmonary/Chest: Effort normal and breath sounds normal. No respiratory distress. He has no wheezes. He has no rales. He exhibits no tenderness.  Neurological: He is alert and oriented to person, place, and time.    LLE strength at 3/5.  No other focal abnormalities.  Skin: Skin is warm and dry. No rash noted.  Vitals reviewed.   Recent Results (from the past 2160 hour(s))  POCT INR     Status: None   Collection Time: 03/07/14  9:18 AM  Result Value Ref Range   INR 2.5   CBC with Differential     Status: Abnormal   Collection Time: 03/20/14  8:15 AM  Result Value Ref Range   WBC 4.4 4.0 - 10.5 K/uL   RBC 4.65 4.22 - 5.81 MIL/uL   Hemoglobin 12.8 (L) 13.0 - 17.0 g/dL   HCT 39.5 39.0 - 52.0 %   MCV 84.9 78.0 - 100.0 fL   MCH 27.5 26.0 - 34.0 pg   MCHC 32.4 30.0 - 36.0 g/dL   RDW 13.7 11.5 - 15.5 %   Platelets 159 150 - 400 K/uL   Neutrophils Relative % 44 43 - 77 %   Neutro Abs 2.0 1.7 - 7.7 K/uL   Lymphocytes Relative 38 12 - 46 %   Lymphs Abs 1.7 0.7 - 4.0 K/uL   Monocytes Relative 10 3 - 12 %   Monocytes Absolute 0.4 0.1 - 1.0 K/uL   Eosinophils Relative 6 (H) 0 - 5 %   Eosinophils Absolute 0.3 0.0 - 0.7 K/uL   Basophils Relative 2 (H) 0 - 1 %   Basophils Absolute 0.1 0.0 - 0.1 K/uL  Basic metabolic panel     Status: Abnormal   Collection Time: 03/20/14  8:15 AM  Result Value Ref Range   Sodium 139 135 - 145 mmol/L    Comment: Please note change in reference range.   Potassium 3.6 3.5 - 5.1 mmol/L    Comment: Please note change in reference range.   Chloride 104 96 - 112 mEq/L   CO2 28 19 - 32 mmol/L   Glucose, Bld 129 (H) 70 - 99 mg/dL   BUN 14 6 - 23 mg/dL   Creatinine, Ser 1.10 0.50 - 1.35 mg/dL   Calcium 9.1 8.4 - 10.5 mg/dL   GFR calc non Af Amer 66 (L) >90 mL/min   GFR calc Af Amer 77 (L) >90 mL/min    Comment: (NOTE) The eGFR has been calculated using the CKD EPI equation. This calculation has not been validated in all clinical situations. eGFR's persistently <90 mL/min signify possible Chronic Kidney Disease.    Anion gap 7 5 - 15  Protime-INR     Status: Abnormal   Collection Time: 03/20/14  8:15 AM  Result Value Ref Range   Prothrombin Time 26.5 (  H) 11.6 -  15.2 seconds   INR 2.42 (H) 0.00 - 1.49  TSH     Status: None   Collection Time: 03/24/14  8:39 AM  Result Value Ref Range   TSH 0.81 0.35 - 4.50 uIU/mL  Hemoglobin A1c     Status: Abnormal   Collection Time: 03/24/14  8:40 AM  Result Value Ref Range   Hgb A1c MFr Bld 7.0 (H) 4.6 - 6.5 %    Comment: Glycemic Control Guidelines for People with Diabetes:Non Diabetic:  <6%Goal of Therapy: <7%Additional Action Suggested:  >8%   POCT INR     Status: None   Collection Time: 04/06/14  9:50 AM  Result Value Ref Range   INR 2.1   POCT INR     Status: None   Collection Time: 04/25/14  9:15 AM  Result Value Ref Range   INR 2.3     Assessment/Plan: Allergic rhinitis Resume Flonase.  Saline nasal spray.  Humidifier in bedroom.   Spastic hemiplegia affecting nondominant side Followed by Neurology.  Weakness noted especially with L knee extension and L hip flexion.  Is ambulating ok without assistance.  Agrees to see PT.  Referral placed for evaluation and strengthening.

## 2014-06-03 ENCOUNTER — Telehealth: Payer: Self-pay | Admitting: Family

## 2014-06-03 DIAGNOSIS — E785 Hyperlipidemia, unspecified: Secondary | ICD-10-CM

## 2014-06-03 MED ORDER — ATORVASTATIN CALCIUM 40 MG PO TABS: 40.0000 mg | ORAL_TABLET | Freq: Every day | ORAL | Status: DC

## 2014-06-03 NOTE — Telephone Encounter (Signed)
-----   Message from Brunetta Jeans, PA-C sent at 06/02/2014  9:32 AM EDT ----- Regarding: Cholesterol Medication Hi [name],  I saw the patient above today regarding his chronic gait instability. Is followed by Neuro.  Finally got him to agree to PT.  I have set this up.  While he was in he wanted to address his Crestor.  States it is very expensive and he wanted to discuss other options.  I did mention simvastatin being on the 4 dollar list at [facility], but told him this decision would need to be made by you as his PCP, if you were comfortable with the medication change.  Just sending you this message per his request.  Happy Friday! Einar Pheasant

## 2014-06-03 NOTE — Telephone Encounter (Signed)
Please let pt know that I would recommend that he try generic lipitor as it is stronger than simvastatin with less drug interactions and less expensive than crestor. D/c crestor, start lipitor, repeat FLP in 6 weeks. Call if  lipitor too expensive.

## 2014-06-05 ENCOUNTER — Ambulatory Visit: Payer: Medicare Other | Admitting: Physical Medicine & Rehabilitation

## 2014-06-05 ENCOUNTER — Ambulatory Visit (INDEPENDENT_AMBULATORY_CARE_PROVIDER_SITE_OTHER): Payer: Medicare Other | Admitting: *Deleted

## 2014-06-05 DIAGNOSIS — I4891 Unspecified atrial fibrillation: Secondary | ICD-10-CM

## 2014-06-05 DIAGNOSIS — Z7901 Long term (current) use of anticoagulants: Secondary | ICD-10-CM | POA: Diagnosis not present

## 2014-06-05 LAB — POCT INR: INR: 2.3

## 2014-06-05 NOTE — Telephone Encounter (Signed)
Left message for pt to return my call.

## 2014-06-06 ENCOUNTER — Encounter: Payer: Medicare Other | Attending: Physical Medicine & Rehabilitation

## 2014-06-06 ENCOUNTER — Ambulatory Visit (HOSPITAL_BASED_OUTPATIENT_CLINIC_OR_DEPARTMENT_OTHER): Payer: Medicare Other | Admitting: Physical Medicine & Rehabilitation

## 2014-06-06 ENCOUNTER — Encounter: Payer: Self-pay | Admitting: Physical Medicine & Rehabilitation

## 2014-06-06 VITALS — BP 132/78 | HR 78 | Resp 14

## 2014-06-06 DIAGNOSIS — R209 Unspecified disturbances of skin sensation: Secondary | ICD-10-CM

## 2014-06-06 DIAGNOSIS — I2510 Atherosclerotic heart disease of native coronary artery without angina pectoris: Secondary | ICD-10-CM | POA: Diagnosis not present

## 2014-06-06 DIAGNOSIS — R208 Other disturbances of skin sensation: Secondary | ICD-10-CM | POA: Diagnosis not present

## 2014-06-06 DIAGNOSIS — G8110 Spastic hemiplegia affecting unspecified side: Secondary | ICD-10-CM | POA: Insufficient documentation

## 2014-06-06 MED ORDER — TRAMADOL HCL ER 200 MG PO TB24: ORAL_TABLET | ORAL | Status: DC

## 2014-06-06 MED ORDER — TRAMADOL HCL 50 MG PO TABS: 50.0000 mg | ORAL_TABLET | Freq: Four times a day (QID) | ORAL | Status: DC | PRN

## 2014-06-06 NOTE — Telephone Encounter (Signed)
Notified pt and he voices understanding. Pt also states that he is concerned that his gait, balance and weakness is worsening over the last few weeks and is concerned that he will be "wheelchair bound". Pt states he was told by neurology that he had small vessel disease and wonders if this may be causing his worsening symptoms. Advised pt to contact neurology for f/u and recommendations. Pt voices understanding. Future lab order entered and letter mailed to pt to call and schedule lab appt in 6 weeks per below instructions.

## 2014-06-06 NOTE — Patient Instructions (Addendum)
Ask neurologist about your feeling of getting weaker  May switch back to Botox rather than dysport if you wish

## 2014-06-06 NOTE — Progress Notes (Signed)
Subjective:    Patient ID: [name], male    DOB: [birth year], 69 y.o.   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 He has had Botox injections to control spasticity in the left hamstring as well as left finger and wrist flexors as well as the left biceps. Because of diminishing duration of Botox injection in fact, trialed on Dysport. Patient states that the Dysport did not relieve his left upper extremity pain the same way as the Botox did.  He also feels like over the last several months or even longer he has had increasing weakness and fatigue. He has an appointment with neurology next month. He does not feel like he's had another stroke but he has been told that he's had abnormalities on his MRIs and CAT scans. We did review his CT scans as well as MRIs of the brain which demonstrated diffuse atrophy as well as diffuse microvascular disease. As we discussed this can lead to some decline in overall function as well as cognition as this progresses over time.   Pain Inventory Average Pain 7 Pain Right Now 7 My pain is constant and tingling  In the last 24 hours, has pain interfered with the following? General activity 6 Relation with others 9 Enjoyment of life 0 What TIME of day is your pain at its worst? morning Sleep (in general) Poor  Pain is worse with: unsure Pain improves with: medication Relief from Meds: 7  Mobility walk without assistance how many minutes can you walk? 20 ability to climb steps?  yes do you drive?  yes Do you have any goals in this area?  no  Function retired Do you have any goals in this area?  no  Neuro/Psych No problems in this area  Prior Studies Any changes since last visit?  no  Physicians involved in your care Any changes since last visit?  no   Family History  Problem Relation Age of Onset  .  Lung cancer Father     deceased  . Stroke Mother     deceased-MINISTROKES   History   Social History  . Marital Status: Married    Spouse Name: [name]  . Number of Children: N/A  . Years of Education: N/A   Occupational History  . retired Hydrographic surveyor   Social History Main Topics  . Smoking status: Former Research scientist (life sciences)  . Smokeless tobacco: Never Used  . Alcohol Use: Yes     Comment: 1 beer a month  . Drug Use: No  . Sexual Activity: No   Other Topics Concern  . None   Social History Narrative   Past Surgical History  Procedure Laterality Date  . Coronary artery bypass graft      stent  . Esophagogastroduodenoscopy  03-25-2005  . Nasal septum surgery    . Percutaneous placement intravascular stent cervical carotid artery      03-2009; using a drug-eluting platform of the circumflex cornoray artery with a 3.0 x 18 [employer] Promus drug-eluting platform post dilated to 3.75 with a noncompliant balloon.  . Cataract extraction  05/2010    left eye  . Cataract extraction  04/2010    right eye  . Cardioversion  06/20/2011    Procedure: CARDIOVERSION;  Surgeon: Larey Dresser, MD;  Location: [facility];  Service: Cardiovascular;  Laterality: N/A;  . [place] Dates  without cardioversion  06/20/2011    Procedure: TRANSESOPHAGEAL ECHOCARDIOGRAM (TEE);  Surgeon: Larey Dresser, MD;  Location: [facility] ENDOSCOPY;  Service: Cardiovascular;  Laterality: N/A;   Past Medical History  Diagnosis Date  . GERD (gastroesophageal reflux disease)   . Gout   . Hyperlipidemia   . Hypertension   . CVA (cerebral infarction)     left hemiparesis  . Chronic pain     left sided-Kristeins  . CAD (coronary artery disease)     s/p CABG, s/p DES to LCX  January 2011  . OSA on CPAP   . Sleep apnea   . History of colonoscopy   . Stroke   . Dysrhythmia     hx of atrial fibrilation with cardioversion   There were no vitals taken for this visit.  Opioid Risk Score:   Fall Risk Score: Moderate  Fall Risk (6-13 points)`1  Depression screen PHQ 2/9  Depression screen PHQ 2/9 03/24/2014  Decreased Interest 0  Down, Depressed, Hopeless 0  PHQ - 2 Score 0     Review of Systems  All other systems reviewed and are negative.      Objective:   Physical Exam  Constitutional: He is oriented to person, place, and time.  Neurological: He is alert and oriented to person, place, and time. He displays no atrophy. He exhibits abnormal muscle tone. Gait abnormal.  Stiff legged gait Modified Ashworth score 1 at the biceps, finger flexors and hamstrings   Psychiatric: He has a normal mood and affect.    Motor strength is 4/5 in the left deltoid bicep tricep but with increased flexor tone, finger flexors 4/5 finger extensors 3+/5 Left lower extremity 4/5 hip flexor and knee extensor 3+ left ankle dorsiflexor plantar flexor 5/5 on the right side        Assessment & Plan:  1. 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 He has no changes in his neurologic functioning other than improvement in his tone following Botox injection of the hamstring biceps and finger flexors. He complains of generalized weakness, slowness.  As discussed with the patient do not think he's had any new major neurologic event, possible progression of ischemic microvascular disease, he will follow up with neurology. Also general physical exam and routine screening labs would be appropriate.  I do not think his symptoms are related to his tramadol given that he's been on these medications for several years without change in doses

## 2014-06-07 NOTE — Telephone Encounter (Signed)
Noted, [name] placed PT referral which I think think will be helpful for his gait disorder.

## 2014-06-14 ENCOUNTER — Telehealth: Payer: Self-pay | Admitting: *Deleted

## 2014-06-14 NOTE — Telephone Encounter (Signed)
Why does he need referral to a neurosurgeon?

## 2014-06-14 NOTE — Telephone Encounter (Signed)
Please advise on below request from patient

## 2014-06-14 NOTE — Telephone Encounter (Signed)
Patient interested in getting a referral for a brain surgery please advise  Call back number -[phone]

## 2014-06-19 ENCOUNTER — Other Ambulatory Visit: Payer: Self-pay | Admitting: Family

## 2014-06-23 ENCOUNTER — Ambulatory Visit (INDEPENDENT_AMBULATORY_CARE_PROVIDER_SITE_OTHER): Payer: Medicare Other | Admitting: Family

## 2014-06-23 ENCOUNTER — Encounter: Payer: Self-pay | Admitting: Family

## 2014-06-23 VITALS — BP 132/84 | HR 76 | Temp 98.0°F | Resp 16 | Ht 75.0 in | Wt 266.8 lb

## 2014-06-23 DIAGNOSIS — I10 Essential (primary) hypertension: Secondary | ICD-10-CM

## 2014-06-23 DIAGNOSIS — I4891 Unspecified atrial fibrillation: Secondary | ICD-10-CM

## 2014-06-23 DIAGNOSIS — E119 Type 2 diabetes mellitus without complications: Secondary | ICD-10-CM | POA: Diagnosis not present

## 2014-06-23 DIAGNOSIS — E785 Hyperlipidemia, unspecified: Secondary | ICD-10-CM

## 2014-06-23 DIAGNOSIS — E039 Hypothyroidism, unspecified: Secondary | ICD-10-CM

## 2014-06-23 LAB — BASIC METABOLIC PANEL
BUN: 18 mg/dL (ref 6–23)
CO2: 31 mEq/L (ref 19–32)
CREATININE: 1.16 mg/dL (ref 0.40–1.50)
Calcium: 9.6 mg/dL (ref 8.4–10.5)
Chloride: 101 mEq/L (ref 96–112)
GFR: 65.95 mL/min (ref >60.00)
Glucose, Bld: 129 mg/dL — ABNORMAL HIGH (ref 70–99)
Potassium: 4.3 mEq/L (ref 3.5–5.1)
Sodium: 138 mEq/L (ref 135–145)

## 2014-06-23 LAB — LIPID PANEL
Cholesterol: 126 mg/dL (ref 0–200)
HDL: 45.5 mg/dL (ref >39.00)
LDL Cholesterol: 58 mg/dL (ref 0–99)
NonHDL: 80.5
TRIGLYCERIDES: 113 mg/dL (ref 0.0–149.0)
Total CHOL/HDL Ratio: 3
VLDL: 22.6 mg/dL (ref 0.0–40.0)

## 2014-06-23 LAB — HEMOGLOBIN A1C: Hgb A1c MFr Bld: 6.6 % — ABNORMAL HIGH (ref 4.6–6.5)

## 2014-06-23 MED ORDER — BENAZEPRIL HCL 10 MG PO TABS: ORAL_TABLET | ORAL | Status: DC

## 2014-06-23 MED ORDER — METFORMIN HCL 500 MG PO TABS: ORAL_TABLET | ORAL | Status: DC

## 2014-06-23 MED ORDER — LEVOTHYROXINE SODIUM 112 MCG PO TABS: ORAL_TABLET | ORAL | Status: DC

## 2014-06-23 MED ORDER — ATORVASTATIN CALCIUM 40 MG PO TABS: 40.0000 mg | ORAL_TABLET | Freq: Every day | ORAL | Status: DC

## 2014-06-23 MED ORDER — DILTIAZEM HCL ER COATED BEADS 180 MG PO CP24: ORAL_CAPSULE | ORAL | Status: DC

## 2014-06-23 MED ORDER — ALLOPURINOL 100 MG PO TABS: ORAL_TABLET | ORAL | Status: DC

## 2014-06-23 MED ORDER — FUROSEMIDE 40 MG PO TABS: 40.0000 mg | ORAL_TABLET | ORAL | Status: DC

## 2014-06-23 NOTE — Assessment & Plan Note (Addendum)
Lab Results  Component Value Date   HGBA1C 6.6* 06/23/2014   A1C is improved on metformin- monitor.

## 2014-06-23 NOTE — Progress Notes (Signed)
Pre visit review using our clinic review tool, if applicable. No additional management support is needed unless otherwise documented below in the visit note. 

## 2014-06-23 NOTE — Progress Notes (Signed)
Subjective:    Patient ID: [name], male    DOB: May 02, 1945, 69 y.o.   MRN: [id number]  HPI   [name] is a 69 yr old male who presents today for follow up.  Patient presents today for follow up of multiple medical problems.  Diabetes Type 2  Pt is currently maintained on the following medications for diabetes:  metformin  Lab Results  Component Value Date   HGBA1C 7.0* 03/24/2014   HGBA1C 6.9* 12/02/2013   HGBA1C 6.5* 08/11/2013    Lab Results  Component Value Date   MICROALBUR 0.50 03/22/2012   LDLCALC 43 08/11/2013   CREATININE 1.10 03/20/2014    Last diabetic eye exam was : last summer- no diabetes changes per pt.  Does not remember name of his eye doctor.  Home glucose readings range not checking, drinking sweet tea  occasional stationary bike.    Hyperlipidemia  Patient is currently maintained on the following medication for hyperlipidemia: atorvastatin Last lipid panel as follows:  Lab Results  Component Value Date   CHOL 105 08/11/2013   HDL 40 08/11/2013   LDLCALC 43 08/11/2013   TRIG 111 08/11/2013   CHOLHDL 2.6 08/11/2013   Patient denies myalgia. Patient reports good compliance with low fat/low cholesterol diet.   Hypertension  Patient is currently maintained on the following medications for blood pressure: benazepril, diltiazem Patient reports good compliance with blood pressure medications. Patient denies chest pain, shortness of breath or swelling. Last 3 blood pressure readings in our office are as follows: BP Readings from Last 3 Encounters:  06/23/14 132/84  06/06/14 132/78  06/02/14 158/92   AF- maintained on coumadin.     Hypothyroid- feels well on synthroid.   Lab Results  Component Value Date   TSH 0.81 03/24/2014    Review of Systems    see HPI  Past Medical History  Diagnosis Date  . GERD (gastroesophageal reflux disease)   . Gout   . Hyperlipidemia   . Hypertension   . CVA (cerebral infarction)     left  hemiparesis  . Chronic pain     left sided-Kristeins  . CAD (coronary artery disease)     s/p CABG, s/p DES to LCX  January 2011  . OSA on CPAP   . Sleep apnea   . History of colonoscopy   . Stroke   . Dysrhythmia     hx of atrial fibrilation with cardioversion    History   Social History  . Marital Status: Married    Spouse Name: [name]  . Number of Children: N/A  . Years of Education: N/A   Occupational History  . retired Hydrographic surveyor   Social History Main Topics  . Smoking status: Former Research scientist (life sciences)  . Smokeless tobacco: Never Used  . Alcohol Use: Yes     Comment: 1 beer a month  . Drug Use: No  . Sexual Activity: No   Other Topics Concern  . Not on file   Social History Narrative    Past Surgical History  Procedure Laterality Date  . Coronary artery bypass graft      stent  . Esophagogastroduodenoscopy  03-25-2005  . Nasal septum surgery    . Percutaneous placement intravascular stent cervical carotid artery      03-2009; using a drug-eluting platform of the circumflex cornoray artery with a 3.0 x 18 [employer] Promus drug-eluting platform post dilated to 3.75 with a noncompliant balloon.  . Cataract  extraction  05/2010    left eye  . Cataract extraction  04/2010    right eye  . Cardioversion  06/20/2011    Procedure: CARDIOVERSION;  Surgeon: Larey Dresser, MD;  Location: [facility] ENDOSCOPY;  Service: Cardiovascular;  Laterality: N/A;  . Tee without cardioversion  06/20/2011    Procedure: TRANSESOPHAGEAL ECHOCARDIOGRAM (TEE);  Surgeon: Larey Dresser, MD;  Location: [facility] ENDOSCOPY;  Service: Cardiovascular;  Laterality: N/A;    Family History  Problem Relation Age of Onset  . Lung cancer Father     deceased  . Stroke Mother     deceased-MINISTROKES    No Known Allergies  Current Outpatient Prescriptions on File Prior to Visit  Medication Sig Dispense Refill  . allopurinol (ZYLOPRIM) 100 MG tablet TAKE 2 TABLETS BY MOUTH DAILY**INS WILL PAY ON  7/18** 180 tablet 1  . atorvastatin (LIPITOR) 40 MG tablet Take 1 tablet (40 mg total) by mouth daily. 30 tablet 3  . benazepril (LOTENSIN) 10 MG tablet TAKE 1 TABLET BY MOUTH DAILY** INS TO PAY ON 7/24** 90 tablet 1  . diltiazem (CARDIZEM CD) 180 MG 24 hr capsule TAKE 1 CAPSULE (180 MG TOTAL) BY MOUTH DAILY. 90 capsule 1  . fluticasone (FLONASE) 50 MCG/ACT nasal spray Place 2 sprays into both nostrils daily. 9.9 g 1  . furosemide (LASIX) 40 MG tablet TAKE 1 TABLET BY MOUTH EVERY OTHER DAY 45 tablet 1  . levothyroxine (SYNTHROID, LEVOTHROID) 112 MCG tablet TAKE 1 TABLET (112 MCG TOTAL) BY MOUTH DAILY BEFORE BREAKFAST. 90 tablet 1  . metFORMIN (GLUCOPHAGE) 500 MG tablet TAKE 500 MG BY MOUTH DAILY WITH BREAKFAST. 90 tablet 1  . nitroGLYCERIN (NITROSTAT) 0.4 MG SL tablet Place 0.4 mg under the tongue every 5 (five) minutes as needed for chest pain. Up to 3 doses    . pantoprazole (PROTONIX) 40 MG tablet TAKE 1 TABLET BY MOUTH DAILY 90 tablet 1  . traMADol (ULTRAM) 50 MG tablet Take 1 tablet (50 mg total) by mouth every 6 (six) hours as needed. 180 tablet 1  . traMADol (ULTRAM-ER) 200 MG 24 hr tablet TAKE 1 TABLET EVERY DAY WITH AM DOSE OF TRAMADOL 50 MG 90 tablet 1  . vitamin B-12 1000 MCG tablet Take 1 tablet (1,000 mcg total) by mouth daily. 30 tablet 0  . warfarin (COUMADIN) 2.5 MG tablet TAKE AS DIRECTED BY ANTICOAGULATION CLINIC 100 tablet 0   No current facility-administered medications on file prior to visit.    BP 132/84 mmHg  Pulse 76  Temp(Src) 98 F (36.7 C) (Oral)  Resp 16  Ht 6\' 3"  (1.905 m)  Wt 266 lb 12.8 oz (121.02 kg)  BMI 33.35 kg/m2  SpO2 94%    Objective:   Physical Exam  Constitutional: He is oriented to person, place, and time. He appears well-developed and well-nourished. No distress.  HENT:  Head: Normocephalic and atraumatic.  Cardiovascular: Normal rate and regular rhythm.   No murmur heard. Pulmonary/Chest: Effort normal and breath sounds normal. No  respiratory distress. He has no wheezes. He has no rales.  Musculoskeletal: He exhibits no edema.  LUE spasticity noted.   Neurological: He is alert and oriented to person, place, and time.  Skin: Skin is warm and dry.  Psychiatric: He has a normal mood and affect. His behavior is normal. Thought content normal.          Assessment & Plan:

## 2014-06-23 NOTE — Assessment & Plan Note (Signed)
Lab Results  Component Value Date   CHOL 126 06/23/2014   HDL 45.50 06/23/2014   LDLCALC 58 06/23/2014   TRIG 113.0 06/23/2014   CHOLHDL 3 06/23/2014   Stable on atorvastatin, continue same.

## 2014-06-23 NOTE — Patient Instructions (Signed)
Please complete your lab work prior to leaving. Follow up in 3 months.   

## 2014-06-25 NOTE — Assessment & Plan Note (Signed)
Stable on current meds, continue same, obtain bmet.

## 2014-06-25 NOTE — Assessment & Plan Note (Signed)
Stable on synthroid, continue same.   ?

## 2014-06-25 NOTE — Assessment & Plan Note (Signed)
Rate stable, coumadin managed by coumadin clinic.

## 2014-07-07 ENCOUNTER — Other Ambulatory Visit: Payer: Self-pay | Admitting: Cardiology

## 2014-07-11 ENCOUNTER — Encounter: Payer: Medicare Other | Attending: Physical Medicine & Rehabilitation

## 2014-07-11 ENCOUNTER — Ambulatory Visit: Payer: Medicare Other | Admitting: Physical Medicine & Rehabilitation

## 2014-07-11 DIAGNOSIS — G8110 Spastic hemiplegia affecting unspecified side: Secondary | ICD-10-CM | POA: Insufficient documentation

## 2014-07-13 DIAGNOSIS — L603 Nail dystrophy: Secondary | ICD-10-CM | POA: Diagnosis not present

## 2014-07-13 DIAGNOSIS — I739 Peripheral vascular disease, unspecified: Secondary | ICD-10-CM | POA: Diagnosis not present

## 2014-07-13 DIAGNOSIS — L84 Corns and callosities: Secondary | ICD-10-CM | POA: Diagnosis not present

## 2014-07-17 ENCOUNTER — Encounter (INDEPENDENT_AMBULATORY_CARE_PROVIDER_SITE_OTHER): Payer: Medicare Other | Admitting: *Deleted

## 2014-07-17 ENCOUNTER — Ambulatory Visit (INDEPENDENT_AMBULATORY_CARE_PROVIDER_SITE_OTHER): Payer: Medicare Other

## 2014-07-17 DIAGNOSIS — I4891 Unspecified atrial fibrillation: Secondary | ICD-10-CM | POA: Diagnosis not present

## 2014-07-17 DIAGNOSIS — Z7901 Long term (current) use of anticoagulants: Secondary | ICD-10-CM

## 2014-07-17 LAB — POCT INR: INR: 2.2

## 2014-07-17 NOTE — Progress Notes (Signed)
This encounter was created in error - please disregard.

## 2014-08-02 ENCOUNTER — Other Ambulatory Visit: Payer: Self-pay | Admitting: Cardiology

## 2014-08-16 ENCOUNTER — Ambulatory Visit (HOSPITAL_BASED_OUTPATIENT_CLINIC_OR_DEPARTMENT_OTHER)
Admission: RE | Admit: 2014-08-16 | Discharge: 2014-08-16 | Disposition: A | Discharge status: Home / Self Care | Payer: Medicare Other | Source: Ambulatory Visit | Attending: Medical | Admitting: Medical

## 2014-08-16 ENCOUNTER — Ambulatory Visit (INDEPENDENT_AMBULATORY_CARE_PROVIDER_SITE_OTHER): Payer: Medicare Other | Admitting: Medical

## 2014-08-16 ENCOUNTER — Telehealth: Payer: Self-pay | Admitting: Family

## 2014-08-16 ENCOUNTER — Encounter: Payer: Self-pay | Admitting: Medical

## 2014-08-16 VITALS — BP 148/80 | HR 74 | Temp 98.2°F | Ht 75.0 in | Wt 263.6 lb

## 2014-08-16 DIAGNOSIS — Z8673 Personal history of transient ischemic attack (TIA), and cerebral infarction without residual deficits: Secondary | ICD-10-CM | POA: Diagnosis not present

## 2014-08-16 DIAGNOSIS — R5383 Other fatigue: Secondary | ICD-10-CM

## 2014-08-16 DIAGNOSIS — R42 Dizziness and giddiness: Secondary | ICD-10-CM

## 2014-08-16 DIAGNOSIS — I2510 Atherosclerotic heart disease of native coronary artery without angina pectoris: Secondary | ICD-10-CM | POA: Diagnosis not present

## 2014-08-16 DIAGNOSIS — G319 Degenerative disease of nervous system, unspecified: Secondary | ICD-10-CM | POA: Diagnosis not present

## 2014-08-16 DIAGNOSIS — R6883 Chills (without fever): Secondary | ICD-10-CM | POA: Diagnosis not present

## 2014-08-16 LAB — POCT URINALYSIS DIPSTICK
Bilirubin, UA: POSITIVE
Blood, UA: NEGATIVE
Glucose, UA: NEGATIVE
Ketones, UA: 5
LEUKOCYTES UA: NEGATIVE
Nitrite, UA: NEGATIVE
Protein, UA: 15
Spec Grav, UA: >=1.03
Urobilinogen, UA: 0.2
pH, UA: 5

## 2014-08-16 NOTE — Telephone Encounter (Signed)
Relation to pt: self  Call back number:[phone]   Reason for call:  Pt states he needs oxygen during the day time, pt states in need of portable air during the day not only at night. [facility]

## 2014-08-16 NOTE — Patient Instructions (Addendum)
Fatigue Will get cbc, cmp,ua and psa, today. Also get fsbs today.   Dizziness and giddiness Gradually slightly worsening past 2 days. Hx of htn, hx of stroke and on blood thinner. Last inr 2 weeks ago was 2.3.   Base on his history, I wanted to see if could get CT of head no contrast. Will put that order in. Pt advised if his symptoms worsen  then ED evaluation.    We will follow labs. Hold off on any antibotics until labs and culture back(if indicated)  Follow up on Friday or Monday dependning on how he feels and lab results.  Please get labs then get the CT of the head.  Ct was ordered stat. Since pt lives almost 30 minutes away. I asked him to stay in imaging until I get the stat results. Pt agreed he would do so.

## 2014-08-16 NOTE — Assessment & Plan Note (Addendum)
Gradually slightly worsening past 2 days. Hx of htn, hx of stroke and on blood thinner. Last inr 2 weeks ago was 2.3.   Base on his history, I wanted to see if could get CT of head no contrast. Will put that order in. Pt advised if his symptoms worsen  then ED evaluation.

## 2014-08-16 NOTE — Progress Notes (Signed)
Subjective:    Patient ID: [name], male    DOB: 06-19-1945, 69 y.o.   MRN: [id number]  HPI   Pt in states he feels very tired. Not doing anything strenuous. Only mild tinkering around his house. 2 days ago he felt  mild off balance. Pt feels like balance issues feel little worse than 2 days ago(progressivley worsening). Pt states he had gait change over a year. Pt did have stroke in 1996. It effected his left side.     Pt not urinating more freqently. Not hesitant flow.  No fever, no sweats.   Chills on Monday afternoon.  Pt diabetic. He is on metformin. Pt has not been checking is blood sugar recenlty. Average blood sugar less than a month ago 6.6.     Review of Systems  Constitutional: Positive for chills and fatigue.  Respiratory: Negative for cough, shortness of breath and wheezing.   Cardiovascular: Negative for chest pain and palpitations.  Gastrointestinal: Negative for abdominal pain.  Endocrine: Negative for polydipsia, polyphagia and polyuria.  Genitourinary: Negative.   Musculoskeletal: Positive for gait problem. Negative for back pain.  Neurological: Positive for dizziness. Negative for syncope, facial asymmetry, speech difficulty, weakness, light-headedness and headaches.  Hematological: Negative for adenopathy. Does not bruise/bleed easily.  Psychiatric/Behavioral: Negative for behavioral problems and confusion.   Past Medical History  Diagnosis Date  . GERD (gastroesophageal reflux disease)   . Gout   . Hyperlipidemia   . Hypertension   . CVA (cerebral infarction)     left hemiparesis  . Chronic pain     left sided-Kristeins  . CAD (coronary artery disease)     s/p CABG, s/p DES to LCX  January 2011  . OSA on CPAP   . Sleep apnea   . History of colonoscopy   . Stroke   . Dysrhythmia     hx of atrial fibrilation with cardioversion    History   Social History  . Marital Status: Married    Spouse Name: [name]  . Number of Children: N/A    . Years of Education: N/A   Occupational History  . retired Hydrographic surveyor   Social History Main Topics  . Smoking status: Former Research scientist (life sciences)  . Smokeless tobacco: Never Used  . Alcohol Use: Yes     Comment: 1 beer a month  . Drug Use: No  . Sexual Activity: No   Other Topics Concern  . Not on file   Social History Narrative    Past Surgical History  Procedure Laterality Date  . Coronary artery bypass graft      stent  . Esophagogastroduodenoscopy  03-25-2005  . Nasal septum surgery    . Percutaneous placement intravascular stent cervical carotid artery      03-2009; using a drug-eluting platform of the circumflex cornoray artery with a 3.0 x 18 [employer] Promus drug-eluting platform post dilated to 3.75 with a noncompliant balloon.  . Cataract extraction  05/2010    left eye  . Cataract extraction  04/2010    right eye  . Cardioversion  06/20/2011    Procedure: CARDIOVERSION;  Surgeon: Larey Dresser, MD;  Location: [facility] ENDOSCOPY;  Service: Cardiovascular;  Laterality: N/A;  . Tee without cardioversion  06/20/2011    Procedure: TRANSESOPHAGEAL ECHOCARDIOGRAM (TEE);  Surgeon: Larey Dresser, MD;  Location: [facility] ENDOSCOPY;  Service: Cardiovascular;  Laterality: N/A;    Family History  Problem Relation Age of Onset  . Lung cancer  Father     deceased  . Stroke Mother     deceased-MINISTROKES    No Known Allergies  Current Outpatient Prescriptions on File Prior to Visit  Medication Sig Dispense Refill  . allopurinol (ZYLOPRIM) 100 MG tablet TAKE 2 TABLETS BY MOUTH DAILY.  D/C PREVIOUS SCRIPTS FOR THIS MEDICATION 180 tablet 1  . atorvastatin (LIPITOR) 40 MG tablet Take 1 tablet (40 mg total) by mouth daily. 90 tablet 1  . benazepril (LOTENSIN) 10 MG tablet TAKE 1 TABLET BY MOUTH DAILY   D/C PREVIOUS SCRIPTS FOR THIS MEDICATION 90 tablet 1  . diltiazem (CARDIZEM CD) 180 MG 24 hr capsule TAKE 1 CAPSULE (180 MG TOTAL) BY MOUTH DAILY.  D/C PREVIOUS SCRIPTS FOR THIS  MEDICATION 90 capsule 1  . fluticasone (FLONASE) 50 MCG/ACT nasal spray Place 2 sprays into both nostrils daily. 9.9 g 1  . furosemide (LASIX) 40 MG tablet Take 1 tablet (40 mg total) by mouth every other day. 45 tablet 1  . levothyroxine (SYNTHROID, LEVOTHROID) 112 MCG tablet TAKE 1 TABLET (112 MCG TOTAL) BY MOUTH DAILY BEFORE BREAKFAST. 90 tablet 1  . nitroGLYCERIN (NITROSTAT) 0.4 MG SL tablet Place 0.4 mg under the tongue every 5 (five) minutes as needed for chest pain. Up to 3 doses    . pantoprazole (PROTONIX) 40 MG tablet TAKE 1 TABLET BY MOUTH DAILY 90 tablet 1  . traMADol (ULTRAM) 50 MG tablet Take 1 tablet (50 mg total) by mouth every 6 (six) hours as needed. 180 tablet 1  . traMADol (ULTRAM-ER) 200 MG 24 hr tablet TAKE 1 TABLET EVERY DAY WITH AM DOSE OF TRAMADOL 50 MG 90 tablet 1  . vitamin B-12 1000 MCG tablet Take 1 tablet (1,000 mcg total) by mouth daily. 30 tablet 0  . warfarin (COUMADIN) 2.5 MG tablet TAKE AS DIRECTED BY ANTICOAGULATION CLINIC 100 tablet 0  . metFORMIN (GLUCOPHAGE) 500 MG tablet TAKE 500 MG BY MOUTH DAILY WITH BREAKFAST.  D/C PREVIOUS SCRIPTS FOR THIS MEDICATION 90 tablet 1   No current facility-administered medications on file prior to visit.    BP 148/80 mmHg  Pulse 74  Temp(Src) 98.2 F (36.8 C) (Oral)  Ht 6\' 3"  (1.905 m)  Wt 263 lb 9.6 oz (119.568 kg)  BMI 32.95 kg/m2  SpO2 96%       Objective:   Physical Exam  General- No acute distress. Pleasant patient. Neck- Full range of motion, no jvd Lungs- Clear, even and unlabored. Heart- regular rate and rhythm. Abdomen- soft, nontender, nontdistended, +bs, no rebound or guarding. No organomegaly.  Back- no cva tenderness  Neurologic Cranial Nerve exam:- CN III-XII intact(No nystagmus), symmetric smile. Drift Test:- No drift. Romberg Exam:- Negative.  Heal to Toe Gait exam:-Normal. Finger to Nose:- left side failed but per this per his  report not new. Strength:- 5/5 equal and symmetric strength  both upper and lower extremities. He has poor gait. Walks with stiff left leg and lt arm is paritally contracted.     Assessment & Plan:

## 2014-08-16 NOTE — Assessment & Plan Note (Addendum)
Will get cbc, cmp,ua and psa, today. Also get fsbs today.

## 2014-08-16 NOTE — Telephone Encounter (Signed)
Pt advised on ct of head results tonight.

## 2014-08-16 NOTE — Progress Notes (Signed)
Pre visit review using our clinic review tool, if applicable. No additional management support is needed unless otherwise documented below in the visit note. 

## 2014-08-16 NOTE — Telephone Encounter (Signed)
Spoke with pt. States he is weak.  "Woke up 2 days ago and could not hardly walk without falling all over himself".  Reports left side feels weaker (side previously affected by stroke). Pt denies confusion, speech difficulty. Reports that left arm has been hurting x 2 weeks but denies chest pain or shortness of breath. Pt feels "his brain is starved for oxygen" and thinks he needs oxygen during the day. States that he sometimes "has to remind himself to breathe". States he has not felt this bad since his previous stroke.  Per verbal from PCP, ok to bring pt in for evaluation in the office. Scheduled pt appt for today with PA, Edward at 3:45pm.

## 2014-08-17 LAB — CBC WITH DIFFERENTIAL/PLATELET
BASOS ABS: 0.1 10*3/uL (ref 0.0–0.1)
Basophils Relative: 1.4 % (ref 0.0–3.0)
EOS ABS: 0.3 10*3/uL (ref 0.0–0.7)
Eosinophils Relative: 5.8 % — ABNORMAL HIGH (ref 0.0–5.0)
HCT: 40.1 % (ref 39.0–52.0)
HEMOGLOBIN: 13.2 g/dL (ref 13.0–17.0)
LYMPHS PCT: 37.9 % (ref 12.0–46.0)
Lymphs Abs: 2.2 10*3/uL (ref 0.7–4.0)
MCHC: 32.9 g/dL (ref 30.0–36.0)
MCV: 88.2 fl (ref 78.0–100.0)
Monocytes Absolute: 0.6 10*3/uL (ref 0.1–1.0)
Monocytes Relative: 10.7 % (ref 3.0–12.0)
NEUTROS PCT: 44.2 % (ref 43.0–77.0)
Neutro Abs: 2.6 10*3/uL (ref 1.4–7.7)
PLATELETS: 170 10*3/uL (ref 150.0–400.0)
RBC: 4.55 Mil/uL (ref 4.22–5.81)
RDW: 14.2 % (ref 11.5–15.5)
WBC: 5.8 10*3/uL (ref 4.0–10.5)

## 2014-08-17 LAB — COMPREHENSIVE METABOLIC PANEL
ALBUMIN: 4.1 g/dL (ref 3.5–5.2)
ALT: 46 U/L (ref 0–53)
AST: 42 U/L — ABNORMAL HIGH (ref 0–37)
Alkaline Phosphatase: 55 U/L (ref 39–117)
BUN: 15 mg/dL (ref 6–23)
CALCIUM: 9.6 mg/dL (ref 8.4–10.5)
CHLORIDE: 105 meq/L (ref 96–112)
CO2: 28 mEq/L (ref 19–32)
Creatinine, Ser: 1.19 mg/dL (ref 0.40–1.50)
GFR: 64 mL/min (ref >60.00)
Glucose, Bld: 106 mg/dL — ABNORMAL HIGH (ref 70–99)
Potassium: 3.9 mEq/L (ref 3.5–5.1)
Sodium: 139 mEq/L (ref 135–145)
TOTAL PROTEIN: 7.1 g/dL (ref 6.0–8.3)
Total Bilirubin: 0.6 mg/dL (ref 0.2–1.2)

## 2014-08-17 LAB — PSA: PSA: 0.19 ng/mL (ref 0.10–4.00)

## 2014-08-17 LAB — TSH: TSH: 1.12 u[IU]/mL (ref 0.35–4.50)

## 2014-08-18 LAB — URINE CULTURE
Colony Count: NO GROWTH
ORGANISM ID, BACTERIA: NO GROWTH

## 2014-08-23 ENCOUNTER — Ambulatory Visit: Payer: Medicare Other | Admitting: Family

## 2014-08-25 ENCOUNTER — Ambulatory Visit (INDEPENDENT_AMBULATORY_CARE_PROVIDER_SITE_OTHER): Payer: Medicare Other | Admitting: Family

## 2014-08-25 ENCOUNTER — Ambulatory Visit (HOSPITAL_BASED_OUTPATIENT_CLINIC_OR_DEPARTMENT_OTHER)
Admission: RE | Admit: 2014-08-25 | Discharge: 2014-08-25 | Disposition: A | Discharge status: Home / Self Care | Payer: Medicare Other | Source: Ambulatory Visit | Attending: Family | Admitting: Family

## 2014-08-25 ENCOUNTER — Encounter: Payer: Self-pay | Admitting: Family

## 2014-08-25 VITALS — BP 140/84 | HR 61 | Temp 97.6°F | Resp 16 | Ht 75.0 in | Wt 267.0 lb

## 2014-08-25 DIAGNOSIS — R0989 Other specified symptoms and signs involving the circulatory and respiratory systems: Secondary | ICD-10-CM

## 2014-08-25 DIAGNOSIS — G729 Myopathy, unspecified: Secondary | ICD-10-CM | POA: Diagnosis not present

## 2014-08-25 DIAGNOSIS — T148 Other injury of unspecified body region: Secondary | ICD-10-CM

## 2014-08-25 DIAGNOSIS — M6289 Other specified disorders of muscle: Secondary | ICD-10-CM

## 2014-08-25 DIAGNOSIS — R0602 Shortness of breath: Secondary | ICD-10-CM | POA: Insufficient documentation

## 2014-08-25 DIAGNOSIS — W57XXXA Bitten or stung by nonvenomous insect and other nonvenomous arthropods, initial encounter: Secondary | ICD-10-CM

## 2014-08-25 DIAGNOSIS — I2510 Atherosclerotic heart disease of native coronary artery without angina pectoris: Secondary | ICD-10-CM

## 2014-08-25 DIAGNOSIS — R270 Ataxia, unspecified: Secondary | ICD-10-CM

## 2014-08-25 NOTE — Patient Instructions (Addendum)
Please complete chest x ray on the first floor. Call if you develop new muscle/joint pain, rash, or fever. You will be contacted about your referral to physical therapy. Use a Can while walking to help prevent falls.

## 2014-08-25 NOTE — Progress Notes (Signed)
Pre visit review using our clinic review tool, if applicable. No additional management support is needed unless otherwise documented below in the visit note. 

## 2014-08-25 NOTE — Progress Notes (Signed)
Subjective:    Patient ID: [name], male    DOB: Nov 05, 1945, 69 y.o.   MRN: [id number]  HPI  [name] is a 69 yr old male who presents today for follow up.  1) Gait disorder- saw Evern Core PA-C on 08/16/14- had CT head performed at that time which was negative for acute changes. At that visit he underwent UA, cbc, tsh, PSA, and urine culture which were all unremarkable. Reports increased tightness in the left arm and the left leg.  Reports "pain doctor" will be giving him some shots.    2) Tick bite- This occurred on 6/7.  Thinks he may have had an insect bite on the base of his neck.   Review of Systems See HPI  Past Medical History  Diagnosis Date  . GERD (gastroesophageal reflux disease)   . Gout   . Hyperlipidemia   . Hypertension   . CVA (cerebral infarction)     left hemiparesis  . Chronic pain     left sided-Kristeins  . CAD (coronary artery disease)     s/p CABG, s/p DES to LCX  January 2011  . OSA on CPAP   . Sleep apnea   . History of colonoscopy   . Stroke   . Dysrhythmia     hx of atrial fibrilation with cardioversion    History   Social History  . Marital Status: Married    Spouse Name: [name]  . Number of Children: N/A  . Years of Education: N/A   Occupational History  . retired Hydrographic surveyor   Social History Main Topics  . Smoking status: Former Research scientist (life sciences)  . Smokeless tobacco: Never Used  . Alcohol Use: Yes     Comment: 1 beer a month  . Drug Use: No  . Sexual Activity: No   Other Topics Concern  . Not on file   Social History Narrative    Past Surgical History  Procedure Laterality Date  . Coronary artery bypass graft      stent  . Esophagogastroduodenoscopy  03-25-2005  . Nasal septum surgery    . Percutaneous placement intravascular stent cervical carotid artery      03-2009; using a drug-eluting platform of the circumflex cornoray artery with a 3.0 x 18 [employer] Promus drug-eluting platform post  dilated to 3.75 with a noncompliant balloon.  . Cataract extraction  05/2010    left eye  . Cataract extraction  04/2010    right eye  . Cardioversion  06/20/2011    Procedure: CARDIOVERSION;  Surgeon: Larey Dresser, MD;  Location: [facility] ENDOSCOPY;  Service: Cardiovascular;  Laterality: N/A;  . Tee without cardioversion  06/20/2011    Procedure: TRANSESOPHAGEAL ECHOCARDIOGRAM (TEE);  Surgeon: Larey Dresser, MD;  Location: [facility] ENDOSCOPY;  Service: Cardiovascular;  Laterality: N/A;    Family History  Problem Relation Age of Onset  . Lung cancer Father     deceased  . Stroke Mother     deceased-MINISTROKES    No Known Allergies  Current Outpatient Prescriptions on File Prior to Visit  Medication Sig Dispense Refill  . allopurinol (ZYLOPRIM) 100 MG tablet TAKE 2 TABLETS BY MOUTH DAILY.  D/C PREVIOUS SCRIPTS FOR THIS MEDICATION 180 tablet 1  . atorvastatin (LIPITOR) 40 MG tablet Take 1 tablet (40 mg total) by mouth daily. 90 tablet 1  . benazepril (LOTENSIN) 10 MG tablet TAKE 1 TABLET BY MOUTH DAILY   D/C PREVIOUS SCRIPTS FOR THIS MEDICATION  90 tablet 1  . diltiazem (CARDIZEM CD) 180 MG 24 hr capsule TAKE 1 CAPSULE (180 MG TOTAL) BY MOUTH DAILY.  D/C PREVIOUS SCRIPTS FOR THIS MEDICATION 90 capsule 1  . fluticasone (FLONASE) 50 MCG/ACT nasal spray Place 2 sprays into both nostrils daily. 9.9 g 1  . furosemide (LASIX) 40 MG tablet Take 1 tablet (40 mg total) by mouth every other day. 45 tablet 1  . levothyroxine (SYNTHROID, LEVOTHROID) 112 MCG tablet TAKE 1 TABLET (112 MCG TOTAL) BY MOUTH DAILY BEFORE BREAKFAST. 90 tablet 1  . metFORMIN (GLUCOPHAGE) 500 MG tablet TAKE 500 MG BY MOUTH DAILY WITH BREAKFAST.  D/C PREVIOUS SCRIPTS FOR THIS MEDICATION 90 tablet 1  . nitroGLYCERIN (NITROSTAT) 0.4 MG SL tablet Place 0.4 mg under the tongue every 5 (five) minutes as needed for chest pain. Up to 3 doses    . pantoprazole (PROTONIX) 40 MG tablet TAKE 1 TABLET BY MOUTH DAILY 90 tablet 1  . traMADol (ULTRAM)  50 MG tablet Take 1 tablet (50 mg total) by mouth every 6 (six) hours as needed. 180 tablet 1  . traMADol (ULTRAM-ER) 200 MG 24 hr tablet TAKE 1 TABLET EVERY DAY WITH AM DOSE OF TRAMADOL 50 MG 90 tablet 1  . vitamin B-12 1000 MCG tablet Take 1 tablet (1,000 mcg total) by mouth daily. 30 tablet 0  . warfarin (COUMADIN) 2.5 MG tablet TAKE AS DIRECTED BY ANTICOAGULATION CLINIC 100 tablet 0   No current facility-administered medications on file prior to visit.    BP 140/84 mmHg  Pulse 61  Temp(Src) 97.6 F (36.4 C) (Oral)  Resp 16  Ht 6\' 3"  (1.905 m)  Wt 267 lb (121.11 kg)  BMI 33.37 kg/m2  SpO2 95%       Objective:   Physical Exam  Constitutional: He is oriented to person, place, and time. He appears well-developed and well-nourished. No distress.  HENT:  Head: Normocephalic and atraumatic.  Cardiovascular: Normal rate and regular rhythm.   No murmur heard. Pulmonary/Chest: Effort normal. No respiratory distress. He has no wheezes. He has rales in the right lower field. He exhibits no tenderness.  Musculoskeletal:  LUE/LLE spasticity is noted  Neurological: He is alert and oriented to person, place, and time.  Slow unsteady gait  Skin:  Small scabbed lesion at top of gluteal cleft without surrounding erythema.    Psychiatric: He has a normal mood and affect. His behavior is normal. Thought content normal.          Assessment & Plan:  Tick bite- no sign of infection. Advised pt on signs/symptoms of lyme disease and advised him to contact us if this occurs.  RLL Lung crackles- will obtain CXR to exclude pneumonia.

## 2014-08-25 NOTE — Assessment & Plan Note (Signed)
Advised pt to use a can to decrease risk of fall. Rec PT to help with left sided strength and spasticity.

## 2014-08-28 ENCOUNTER — Ambulatory Visit (INDEPENDENT_AMBULATORY_CARE_PROVIDER_SITE_OTHER): Payer: Medicare Other | Admitting: *Deleted

## 2014-08-28 DIAGNOSIS — I4891 Unspecified atrial fibrillation: Secondary | ICD-10-CM | POA: Diagnosis not present

## 2014-08-28 DIAGNOSIS — Z7901 Long term (current) use of anticoagulants: Secondary | ICD-10-CM

## 2014-08-28 LAB — POCT INR: INR: 1.6

## 2014-09-11 ENCOUNTER — Other Ambulatory Visit: Payer: Self-pay

## 2014-09-11 ENCOUNTER — Ambulatory Visit (INDEPENDENT_AMBULATORY_CARE_PROVIDER_SITE_OTHER): Payer: Medicare Other | Admitting: Pharmacist

## 2014-09-11 DIAGNOSIS — Z7901 Long term (current) use of anticoagulants: Secondary | ICD-10-CM

## 2014-09-11 DIAGNOSIS — I4891 Unspecified atrial fibrillation: Secondary | ICD-10-CM

## 2014-09-11 LAB — POCT INR: INR: 1.8

## 2014-09-14 ENCOUNTER — Encounter: Payer: Medicare Other | Attending: Physical Medicine & Rehabilitation

## 2014-09-14 ENCOUNTER — Encounter: Payer: Self-pay | Admitting: Physical Medicine & Rehabilitation

## 2014-09-14 ENCOUNTER — Ambulatory Visit (HOSPITAL_BASED_OUTPATIENT_CLINIC_OR_DEPARTMENT_OTHER): Payer: Medicare Other | Admitting: Physical Medicine & Rehabilitation

## 2014-09-14 VITALS — BP 140/80 | HR 88 | Resp 14

## 2014-09-14 DIAGNOSIS — G8114 Spastic hemiplegia affecting left nondominant side: Secondary | ICD-10-CM

## 2014-09-14 DIAGNOSIS — G8110 Spastic hemiplegia affecting unspecified side: Secondary | ICD-10-CM | POA: Diagnosis not present

## 2014-09-14 MED ORDER — TRAMADOL HCL 50 MG PO TABS: 50.0000 mg | ORAL_TABLET | Freq: Four times a day (QID) | ORAL | Status: DC | PRN

## 2014-09-14 MED ORDER — TRAMADOL HCL ER 200 MG PO TB24: ORAL_TABLET | ORAL | Status: DC

## 2014-09-14 NOTE — Progress Notes (Signed)
   Subjective:    Patient ID: [name], male    DOB: 1946-01-12, 69 y.o.   MRN: [id number]  HPI    Review of Systems     Objective:   Physical Exam        Assessment & Plan:  Botox Injection for spasticity using needle EMG guidance and E stim  Dilution: 50 Units/ml  Indication: Severe spasticity which interferes with ADL,mobility and/or hygiene and is unresponsive to medication management and other conservative care  Informed consent was obtained after describing risks and benefits of the procedure with the patient. This includes bleeding, bruising, infection, excessive weakness, or medication side effects. A REMS form is on file and signed.  Needle: 27g 1" needle electrode  Number of units per muscle   Biceps100  FCR50  FCU0  FDS50  FDP0  Gastrosoleus0  Hamstrings 200  All injections were done after obtaining appropriate EMG activity and E stinm and after negative drawback for blood. The patient tolerated the procedure well. Post procedure instructions were given. A followup appointment was made.

## 2014-09-14 NOTE — Patient Instructions (Signed)

## 2014-09-26 ENCOUNTER — Encounter: Payer: Self-pay | Admitting: Family

## 2014-09-26 ENCOUNTER — Ambulatory Visit (INDEPENDENT_AMBULATORY_CARE_PROVIDER_SITE_OTHER): Payer: Medicare Other | Admitting: Family

## 2014-09-26 ENCOUNTER — Other Ambulatory Visit: Payer: Self-pay | Admitting: Family

## 2014-09-26 VITALS — BP 138/84 | HR 73 | Temp 97.9°F | Resp 16 | Ht 75.0 in | Wt 263.4 lb

## 2014-09-26 DIAGNOSIS — E039 Hypothyroidism, unspecified: Secondary | ICD-10-CM

## 2014-09-26 DIAGNOSIS — E119 Type 2 diabetes mellitus without complications: Secondary | ICD-10-CM

## 2014-09-26 DIAGNOSIS — E785 Hyperlipidemia, unspecified: Secondary | ICD-10-CM

## 2014-09-26 DIAGNOSIS — K219 Gastro-esophageal reflux disease without esophagitis: Secondary | ICD-10-CM

## 2014-09-26 DIAGNOSIS — I2510 Atherosclerotic heart disease of native coronary artery without angina pectoris: Secondary | ICD-10-CM

## 2014-09-26 LAB — LIPID PANEL
Cholesterol: 162 mg/dL (ref 0–200)
HDL: 46.9 mg/dL (ref >39.00)
LDL CALC: 90 mg/dL (ref 0–99)
NONHDL: 115.1
Total CHOL/HDL Ratio: 3
Triglycerides: 128 mg/dL (ref 0.0–149.0)
VLDL: 25.6 mg/dL (ref 0.0–40.0)

## 2014-09-26 LAB — MICROALBUMIN / CREATININE URINE RATIO
CREATININE, U: 51.5 mg/dL
Microalb Creat Ratio: 1.4 mg/g (ref 0.0–30.0)

## 2014-09-26 LAB — HEMOGLOBIN A1C: Hgb A1c MFr Bld: 6.5 % (ref 4.6–6.5)

## 2014-09-26 MED ORDER — PANTOPRAZOLE SODIUM 40 MG PO TBEC: 40.0000 mg | DELAYED_RELEASE_TABLET | Freq: Every day | ORAL | Status: DC

## 2014-09-26 NOTE — Assessment & Plan Note (Signed)
Stable on PPI, continue same.  

## 2014-09-26 NOTE — Assessment & Plan Note (Signed)
Tolerating lipitor, obtain follow up lipid panel.  

## 2014-09-26 NOTE — Assessment & Plan Note (Signed)
TSH stable on synthroid, continue current

## 2014-09-26 NOTE — Assessment & Plan Note (Signed)
Obtain A1C, urine microalbumin, continue metformin.

## 2014-09-26 NOTE — Progress Notes (Signed)
Subjective:    Patient ID: [name], male    DOB: Apr 05, 1945, 69 y.o.   MRN: [id number]  HPI  [name] is a 69 yr old male who presents today for follow up.  Patient presents today for follow up of multiple medical problems.   Diabetes Type 2  Pt is currently maintained on the following medications for diabetes: metformin  Lab Results  Component Value Date   HGBA1C 6.6* 06/23/2014   HGBA1C 7.0* 03/24/2014   HGBA1C 6.9* 12/02/2013    Lab Results  Component Value Date   MICROALBUR 0.50 03/22/2012   LDLCALC 58 06/23/2014   CREATININE 1.19 08/16/2014    Last diabetic eye exam was No results found for: HMDIABEYEEXA   Hyperlipidemia  Patient is currently maintained on the following medication for hyperlipidemia: lipitor 40mg - was changed from crestor due to cost.   Last lipid panel as follows:  Lab Results  Component Value Date   CHOL 126 06/23/2014   HDL 45.50 06/23/2014   LDLCALC 58 06/23/2014   TRIG 113.0 06/23/2014   CHOLHDL 3 06/23/2014    Hypertension  Patient is currently maintained on the following medications for blood pressure: benazepril, diltiazem, furosemide Patient reports good compliance with blood pressure medications. Patient denies chest pain, shortness of breath or swelling. Last 3 blood pressure readings in our office are as follows: BP Readings from Last 3 Encounters:  09/26/14 138/84  09/14/14 140/80  08/25/14 140/84   Hypothyroid-  Lab Results  Component Value Date   TSH 1.12 08/16/2014   GERD- on PPI (protonix)- reports stable   Reports that he realized that he did not follow up for his botox shots on time and that this was reason he was having worsening stiffness in his left upper arm and in the back of his left leg.  He reports that he still has some intermittent slowing of his gait (declines earlier follow up with neuro to discuss this). He is followed by Dr. Tasia Catchings for his botox injections.     Review of Systems See  HPI  Past Medical History  Diagnosis Date  . GERD (gastroesophageal reflux disease)   . Gout   . Hyperlipidemia   . Hypertension   . CVA (cerebral infarction)     left hemiparesis  . Chronic pain     left sided-Kristeins  . CAD (coronary artery disease)     s/p CABG, s/p DES to LCX  January 2011  . OSA on CPAP   . Sleep apnea   . History of colonoscopy   . Stroke   . Dysrhythmia     hx of atrial fibrilation with cardioversion    History   Social History  . Marital Status: Married    Spouse Name: [name]  . Number of Children: N/A  . Years of Education: N/A   Occupational History  . retired Hydrographic surveyor   Social History Main Topics  . Smoking status: Former Research scientist (life sciences)  . Smokeless tobacco: Never Used  . Alcohol Use: No     Comment: 1 beer a month  . Drug Use: No  . Sexual Activity: No   Other Topics Concern  . Not on file   Social History Narrative    Past Surgical History  Procedure Laterality Date  . Coronary artery bypass graft      stent  . Esophagogastroduodenoscopy  03-25-2005  . Nasal septum surgery    . Percutaneous placement intravascular stent cervical carotid artery  03-2009; using a drug-eluting platform of the circumflex cornoray artery with a 3.0 x 18 [employer] Promus drug-eluting platform post dilated to 3.75 with a noncompliant balloon.  . Cataract extraction  05/2010    left eye  . Cataract extraction  04/2010    right eye  . Cardioversion  06/20/2011    Procedure: CARDIOVERSION;  Surgeon: Larey Dresser, MD;  Location: [facility] ENDOSCOPY;  Service: Cardiovascular;  Laterality: N/A;  . Tee without cardioversion  06/20/2011    Procedure: TRANSESOPHAGEAL ECHOCARDIOGRAM (TEE);  Surgeon: Larey Dresser, MD;  Location: [facility] ENDOSCOPY;  Service: Cardiovascular;  Laterality: N/A;    Family History  Problem Relation Age of Onset  . Lung cancer Father     deceased  . Stroke Mother     deceased-MINISTROKES    No Known  Allergies  Current Outpatient Prescriptions on File Prior to Visit  Medication Sig Dispense Refill  . allopurinol (ZYLOPRIM) 100 MG tablet TAKE 2 TABLETS BY MOUTH DAILY.  D/C PREVIOUS SCRIPTS FOR THIS MEDICATION 180 tablet 1  . atorvastatin (LIPITOR) 40 MG tablet Take 1 tablet (40 mg total) by mouth daily. 90 tablet 1  . benazepril (LOTENSIN) 10 MG tablet TAKE 1 TABLET BY MOUTH DAILY   D/C PREVIOUS SCRIPTS FOR THIS MEDICATION 90 tablet 1  . diltiazem (CARDIZEM CD) 180 MG 24 hr capsule TAKE 1 CAPSULE (180 MG TOTAL) BY MOUTH DAILY.  D/C PREVIOUS SCRIPTS FOR THIS MEDICATION 90 capsule 1  . fluticasone (FLONASE) 50 MCG/ACT nasal spray Place 2 sprays into both nostrils daily. 9.9 g 1  . furosemide (LASIX) 40 MG tablet Take 1 tablet (40 mg total) by mouth every other day. 45 tablet 1  . levothyroxine (SYNTHROID, LEVOTHROID) 112 MCG tablet TAKE 1 TABLET (112 MCG TOTAL) BY MOUTH DAILY BEFORE BREAKFAST. 90 tablet 1  . metFORMIN (GLUCOPHAGE) 500 MG tablet TAKE 500 MG BY MOUTH DAILY WITH BREAKFAST.  D/C PREVIOUS SCRIPTS FOR THIS MEDICATION 90 tablet 1  . nitroGLYCERIN (NITROSTAT) 0.4 MG SL tablet Place 0.4 mg under the tongue every 5 (five) minutes as needed for chest pain. Up to 3 doses    . traMADol (ULTRAM) 50 MG tablet Take 1 tablet (50 mg total) by mouth every 6 (six) hours as needed. 180 tablet 1  . traMADol (ULTRAM-ER) 200 MG 24 hr tablet TAKE 1 TABLET EVERY DAY WITH AM DOSE OF TRAMADOL 50 MG 90 tablet 1  . vitamin B-12 1000 MCG tablet Take 1 tablet (1,000 mcg total) by mouth daily. 30 tablet 0  . warfarin (COUMADIN) 2.5 MG tablet TAKE AS DIRECTED BY ANTICOAGULATION CLINIC 100 tablet 0   No current facility-administered medications on file prior to visit.    BP 138/84 mmHg  Pulse 73  Temp(Src) 97.9 F (36.6 C) (Oral)  Resp 16  Ht 6\' 3"  (1.905 m)  Wt 263 lb 6.4 oz (119.477 kg)  BMI 32.92 kg/m2  SpO2 94%       Objective:   Physical Exam  Constitutional: He is oriented to person, place,  and time. He appears well-developed and well-nourished. No distress.  HENT:  Head: Normocephalic and atraumatic.  Cardiovascular: Normal rate and regular rhythm.   No murmur heard. Pulmonary/Chest: Effort normal and breath sounds normal. No respiratory distress. He has no wheezes. He has no rales. He exhibits no tenderness.  Neurological: He is alert and oriented to person, place, and time.  Psychiatric: He has a normal mood and affect. His behavior is normal. Judgment and thought content  normal.          Assessment & Plan:

## 2014-09-26 NOTE — Patient Instructions (Signed)
Please complete lab work prior to leaving.   Please schedule a follow up appointment in 3 months.  

## 2014-09-26 NOTE — Progress Notes (Signed)
Pre visit review using our clinic review tool, if applicable. No additional management support is needed unless otherwise documented below in the visit note. 

## 2014-09-28 ENCOUNTER — Encounter: Payer: Self-pay | Admitting: Family

## 2014-09-28 ENCOUNTER — Other Ambulatory Visit: Payer: Self-pay | Admitting: Family

## 2014-09-28 MED ORDER — ATORVASTATIN CALCIUM 80 MG PO TABS: 80.0000 mg | ORAL_TABLET | Freq: Every day | ORAL | Status: DC

## 2014-10-02 ENCOUNTER — Ambulatory Visit (INDEPENDENT_AMBULATORY_CARE_PROVIDER_SITE_OTHER): Payer: Medicare Other | Admitting: *Deleted

## 2014-10-02 DIAGNOSIS — I4891 Unspecified atrial fibrillation: Secondary | ICD-10-CM

## 2014-10-02 DIAGNOSIS — Z7901 Long term (current) use of anticoagulants: Secondary | ICD-10-CM | POA: Diagnosis not present

## 2014-10-02 LAB — POCT INR: INR: 2.1

## 2014-10-08 ENCOUNTER — Encounter (HOSPITAL_COMMUNITY): Payer: Self-pay | Admitting: Nurse Practitioner

## 2014-10-08 ENCOUNTER — Emergency Department (HOSPITAL_COMMUNITY): Payer: Medicare Other

## 2014-10-08 ENCOUNTER — Emergency Department (HOSPITAL_COMMUNITY)
Admission: EM | Admit: 2014-10-08 | Discharge: 2014-10-08 | Disposition: A | Discharge status: Home / Self Care | Payer: Medicare Other | Attending: Emergency Medicine | Admitting: Emergency Medicine

## 2014-10-08 DIAGNOSIS — X58XXXA Exposure to other specified factors, initial encounter: Secondary | ICD-10-CM | POA: Diagnosis not present

## 2014-10-08 DIAGNOSIS — Z8673 Personal history of transient ischemic attack (TIA), and cerebral infarction without residual deficits: Secondary | ICD-10-CM | POA: Diagnosis not present

## 2014-10-08 DIAGNOSIS — E785 Hyperlipidemia, unspecified: Secondary | ICD-10-CM | POA: Diagnosis not present

## 2014-10-08 DIAGNOSIS — M109 Gout, unspecified: Secondary | ICD-10-CM | POA: Insufficient documentation

## 2014-10-08 DIAGNOSIS — Z7951 Long term (current) use of inhaled steroids: Secondary | ICD-10-CM | POA: Insufficient documentation

## 2014-10-08 DIAGNOSIS — I10 Essential (primary) hypertension: Secondary | ICD-10-CM | POA: Diagnosis not present

## 2014-10-08 DIAGNOSIS — G8929 Other chronic pain: Secondary | ICD-10-CM | POA: Diagnosis not present

## 2014-10-08 DIAGNOSIS — Y9389 Activity, other specified: Secondary | ICD-10-CM | POA: Insufficient documentation

## 2014-10-08 DIAGNOSIS — Z9981 Dependence on supplemental oxygen: Secondary | ICD-10-CM | POA: Diagnosis not present

## 2014-10-08 DIAGNOSIS — Z79899 Other long term (current) drug therapy: Secondary | ICD-10-CM | POA: Diagnosis not present

## 2014-10-08 DIAGNOSIS — S9031XA Contusion of right foot, initial encounter: Secondary | ICD-10-CM | POA: Diagnosis not present

## 2014-10-08 DIAGNOSIS — G4733 Obstructive sleep apnea (adult) (pediatric): Secondary | ICD-10-CM | POA: Diagnosis not present

## 2014-10-08 DIAGNOSIS — K219 Gastro-esophageal reflux disease without esophagitis: Secondary | ICD-10-CM | POA: Diagnosis not present

## 2014-10-08 DIAGNOSIS — Y998 Other external cause status: Secondary | ICD-10-CM | POA: Insufficient documentation

## 2014-10-08 DIAGNOSIS — M7731 Calcaneal spur, right foot: Secondary | ICD-10-CM | POA: Diagnosis not present

## 2014-10-08 DIAGNOSIS — Z87891 Personal history of nicotine dependence: Secondary | ICD-10-CM | POA: Insufficient documentation

## 2014-10-08 DIAGNOSIS — S99921A Unspecified injury of right foot, initial encounter: Secondary | ICD-10-CM | POA: Diagnosis present

## 2014-10-08 DIAGNOSIS — Y929 Unspecified place or not applicable: Secondary | ICD-10-CM | POA: Insufficient documentation

## 2014-10-08 DIAGNOSIS — I2510 Atherosclerotic heart disease of native coronary artery without angina pectoris: Secondary | ICD-10-CM | POA: Diagnosis not present

## 2014-10-08 LAB — PROTIME-INR
INR: 2.21 — ABNORMAL HIGH (ref 0.00–1.49)
Prothrombin Time: 24.3 seconds — ABNORMAL HIGH (ref 11.6–15.2)

## 2014-10-08 NOTE — ED Provider Notes (Signed)
CSN: [id number]     Arrival date & time 10/08/14  1322 History   First MD Initiated Contact with Patient 10/08/14 1330     Chief Complaint  Patient presents with  . Foot Problem     (Consider location/radiation/quality/duration/timing/severity/associated sxs/prior Treatment) HPI Patient noticed bruising over the dorsum of his right foot this morning. Associated with mild pain. No known injury. No recent change in Coumadin dose. No other complaint. No treatment prior to coming here. Nothing makes symptoms better or worse. No other associated symptoms Past Medical History  Diagnosis Date  . GERD (gastroesophageal reflux disease)   . Gout   . Hyperlipidemia   . Hypertension   . CVA (cerebral infarction)     left hemiparesis  . Chronic pain     left sided-Kristeins  . CAD (coronary artery disease)     s/p CABG, s/p DES to LCX  January 2011  . OSA on CPAP   . Sleep apnea   . History of colonoscopy   . Stroke   . Dysrhythmia     hx of atrial fibrilation with cardioversion   Past Surgical History  Procedure Laterality Date  . Coronary artery bypass graft      stent  . Esophagogastroduodenoscopy  03-25-2005  . Nasal septum surgery    . Percutaneous placement intravascular stent cervical carotid artery      03-2009; using a drug-eluting platform of the circumflex cornoray artery with a 3.0 x 18 [employer] Promus drug-eluting platform post dilated to 3.75 with a noncompliant balloon.  . Cataract extraction  05/2010    left eye  . Cataract extraction  04/2010    right eye  . Cardioversion  06/20/2011    Procedure: CARDIOVERSION;  Surgeon: Larey Dresser, MD;  Location: [facility] ENDOSCOPY;  Service: Cardiovascular;  Laterality: N/A;  . Tee without cardioversion  06/20/2011    Procedure: TRANSESOPHAGEAL ECHOCARDIOGRAM (TEE);  Surgeon: Larey Dresser, MD;  Location: [facility] ENDOSCOPY;  Service: Cardiovascular;  Laterality: N/A;   Family History  Problem Relation Age of Onset  . Lung cancer  Father     deceased  . Stroke Mother     deceased-MINISTROKES   History  Substance Use Topics  . Smoking status: Former Research scientist (life sciences)  . Smokeless tobacco: Never Used  . Alcohol Use: No     Comment: 1 beer a month    Review of Systems  Constitutional: Negative.   HENT: Negative.   Respiratory: Negative.   Cardiovascular: Negative.   Gastrointestinal: Negative.   Musculoskeletal: Positive for gait problem.       Walks with a limp favoring left leg chronically. Right foot pain since this morning  Skin: Negative.   Neurological: Positive for numbness.       Chronic numbness over left side of body  Hematological: Bruises/bleeds easily.  Psychiatric/Behavioral: Negative.   All other systems reviewed and are negative.     Allergies  Review of patient's allergies indicates no known allergies.  Home Medications   Prior to Admission medications   Medication Sig Start Date End Date Taking? Authorizing Provider  allopurinol (ZYLOPRIM) 100 MG tablet TAKE 2 TABLETS BY MOUTH DAILY.  D/C PREVIOUS SCRIPTS FOR THIS MEDICATION 06/23/14   Debbrah Alar, NP  atorvastatin (LIPITOR) 80 MG tablet Take 1 tablet (80 mg total) by mouth daily. 09/28/14   Debbrah Alar, NP  benazepril (LOTENSIN) 10 MG tablet TAKE 1 TABLET BY MOUTH DAILY   D/C PREVIOUS SCRIPTS FOR THIS MEDICATION 06/23/14   Lenna Sciara  Inda Castle, NP  diltiazem (CARDIZEM CD) 180 MG 24 hr capsule TAKE 1 CAPSULE (180 MG TOTAL) BY MOUTH DAILY.  D/C PREVIOUS SCRIPTS FOR THIS MEDICATION 06/23/14   Debbrah Alar, NP  fluticasone (FLONASE) 50 MCG/ACT nasal spray Place 2 sprays into both nostrils daily. 06/02/14   Brunetta Jeans, PA-C  furosemide (LASIX) 40 MG tablet Take 1 tablet (40 mg total) by mouth every other day. 06/23/14   Debbrah Alar, NP  levothyroxine (SYNTHROID, LEVOTHROID) 112 MCG tablet TAKE 1 TABLET (112 MCG TOTAL) BY MOUTH DAILY BEFORE BREAKFAST. 06/23/14   Debbrah Alar, NP  metFORMIN (GLUCOPHAGE) 500 MG tablet TAKE 500  MG BY MOUTH DAILY WITH BREAKFAST.  D/C PREVIOUS SCRIPTS FOR THIS MEDICATION 06/23/14   Debbrah Alar, NP  nitroGLYCERIN (NITROSTAT) 0.4 MG SL tablet Place 0.4 mg under the tongue every 5 (five) minutes as needed for chest pain. Up to 3 doses    Historical Provider, MD  pantoprazole (PROTONIX) 40 MG tablet Take 1 tablet (40 mg total) by mouth daily. 09/26/14   Debbrah Alar, NP  traMADol (ULTRAM) 50 MG tablet Take 1 tablet (50 mg total) by mouth every 6 (six) hours as needed. 09/14/14   Charlett Blake, MD  traMADol (ULTRAM-ER) 200 MG 24 hr tablet TAKE 1 TABLET EVERY DAY WITH AM DOSE OF TRAMADOL 50 MG 09/14/14   Charlett Blake, MD  vitamin B-12 1000 MCG tablet Take 1 tablet (1,000 mcg total) by mouth daily. 08/11/13   Orson Eva, MD  warfarin (COUMADIN) 2.5 MG tablet TAKE AS DIRECTED BY ANTICOAGULATION CLINIC 08/02/14   Larey Dresser, MD   BP 139/89 mmHg  Pulse 67  Temp(Src) 97.7 F (36.5 C) (Oral)  Resp 20  Wt 255 lb (115.667 kg)  SpO2 96% Physical Exam  Constitutional: He appears well-developed and well-nourished.  HENT:  Head: Normocephalic and atraumatic.  Eyes: Conjunctivae are normal. Pupils are equal, round, and reactive to light.  Neck: Neck supple. No tracheal deviation present. No thyromegaly present.  Cardiovascular: Normal rate and regular rhythm.   No murmur heard. Pulmonary/Chest: Effort normal and breath sounds normal.  Abdominal: Soft. Bowel sounds are normal. He exhibits no distension. There is no tenderness.  Musculoskeletal: Normal range of motion. He exhibits no edema or tenderness.  Right lower extremity skin intact. There is a 3 cm purplish ecchymosis over the dorsum of the foot, with mild corresponding tenderness. DP pulse 2+. Good capillary refill all other extremities no contusion abrasion or tenderness neurovascularly intact  Neurological: He is alert. Coordination normal.  Skin: Skin is warm and dry. No rash noted.  Psychiatric: He has a normal mood  and affect.  Nursing note and vitals reviewed.   ED Course  Procedures (including critical care time) Labs Review Labs Reviewed - No data to display  Imaging Review No results found.   EKG Interpretation None     X-ray viewed by me Results for orders placed or performed during the hospital encounter of 10/08/14  Protime-INR  Result Value Ref Range   Prothrombin Time 24.3 (H) 11.6 - 15.2 seconds   INR 2.21 (H) 0.00 - 1.49   Dg Foot Complete Right  10/08/2014   CLINICAL DATA:  Dorsal right foot pain  EXAM: RIGHT FOOT COMPLETE - 3+ VIEW  COMPARISON:  None.  FINDINGS: Degenerative change noted at the first tarsometatarsal joint. Mild hallux valgus deformity noted. No fracture or dislocation. No radiopaque foreign body. No soft tissue abnormality. Plantar calcaneal and Achilles tendon insertional spurring noted.  IMPRESSION: No acute osseous abnormality.   Electronically Signed   By: Conchita Paris M.D.   On: 10/08/2014 15:11   Declines pain medicine MDM  Plan follow-up with PMD as needed. INR is therapeutic. Final diagnoses:  None   Diagnosis contusion of right foot     Orlie Dakin, MD 10/08/14 [phone]

## 2014-10-08 NOTE — ED Notes (Signed)
He noticed painless bruising and redness to top of R foot when waking this am. He denies any injuries but would like to have it checked out because he takes blood thinners. CMS intact

## 2014-10-08 NOTE — Discharge Instructions (Signed)
Contusion Your INR today (coumadin level) is 2.21 which is a good number. Your x-ray showed arthritis. Take Tylenol as needed for pain. Follow-up Debbrah Alar or return if concern for any reason A contusion is a deep bruise. Contusions happen when an injury causes bleeding under the skin. Signs of bruising include pain, puffiness (swelling), and discolored skin. The contusion may turn blue, purple, or yellow. HOME CARE   Put ice on the injured area.  Put ice in a plastic bag.  Place a towel between your skin and the bag.  Leave the ice on for 15-20 minutes, 03-04 times a day.  Only take medicine as told by your doctor.  Rest the injured area.  If possible, raise (elevate) the injured area to lessen puffiness. GET HELP RIGHT AWAY IF:   You have more bruising or puffiness.  You have pain that is getting worse.  Your puffiness or pain is not helped by medicine. MAKE SURE YOU:   Understand these instructions.  Will watch your condition.  Will get help right away if you are not doing well or get worse. Document Released: 08/20/2007 Document Revised: 05/26/2011 Document Reviewed: 01/06/2011 [facility] Patient Information 2015 [place], [place]. This information is not intended to replace advice given to you by your health care provider. Make sure you discuss any questions you have with your health care provider.

## 2014-10-12 NOTE — Telephone Encounter (Signed)
pls contact pt re: unread message. 

## 2014-10-17 ENCOUNTER — Telehealth: Payer: Self-pay | Admitting: Family

## 2014-10-17 NOTE — Telephone Encounter (Signed)
See additional phone note. 

## 2014-10-17 NOTE — Telephone Encounter (Signed)
Left message for pt to return my call re: below results that were sent to him via mychart on 09/28/14.  Your sugar control has improved. Cholesterol is almost at goal. I would recommend that you increase your atorvastatin from 40mg  to 80 mg once daily. You can take two of the 40mg  tabs once daily until you run out, then start the 80 mg tabs that I am sending to your pharmacy. I am asking our referral coordinator to check the status of your referral to physical therapy.

## 2014-10-17 NOTE — Telephone Encounter (Signed)
Pt verbalized understanding about med changes.

## 2014-10-17 NOTE — Telephone Encounter (Signed)
Relation to [id number]  Call back number: [phone]   Reason for call:  Patient would like to discuss the increase in MG change regarding atorvastatin (LIPITOR).

## 2014-10-23 ENCOUNTER — Other Ambulatory Visit: Payer: Self-pay | Admitting: Family

## 2014-10-29 ENCOUNTER — Other Ambulatory Visit: Payer: Self-pay | Admitting: Cardiology

## 2014-10-30 ENCOUNTER — Ambulatory Visit (INDEPENDENT_AMBULATORY_CARE_PROVIDER_SITE_OTHER): Payer: Medicare Other | Admitting: *Deleted

## 2014-10-30 DIAGNOSIS — I4891 Unspecified atrial fibrillation: Secondary | ICD-10-CM

## 2014-10-30 DIAGNOSIS — Z7901 Long term (current) use of anticoagulants: Secondary | ICD-10-CM

## 2014-10-30 LAB — POCT INR: INR: 1.9

## 2014-11-10 ENCOUNTER — Emergency Department (HOSPITAL_COMMUNITY)
Admission: EM | Admit: 2014-11-10 | Discharge: 2014-11-10 | Disposition: A | Discharge status: Home / Self Care | Payer: Medicare Other | Attending: Emergency Medicine | Admitting: Emergency Medicine

## 2014-11-10 ENCOUNTER — Encounter (HOSPITAL_COMMUNITY): Payer: Self-pay | Admitting: Emergency Medicine

## 2014-11-10 DIAGNOSIS — Y9389 Activity, other specified: Secondary | ICD-10-CM | POA: Diagnosis not present

## 2014-11-10 DIAGNOSIS — G8929 Other chronic pain: Secondary | ICD-10-CM | POA: Diagnosis not present

## 2014-11-10 DIAGNOSIS — I2510 Atherosclerotic heart disease of native coronary artery without angina pectoris: Secondary | ICD-10-CM | POA: Insufficient documentation

## 2014-11-10 DIAGNOSIS — E785 Hyperlipidemia, unspecified: Secondary | ICD-10-CM | POA: Diagnosis not present

## 2014-11-10 DIAGNOSIS — T24202A Burn of second degree of unspecified site of left lower limb, except ankle and foot, initial encounter: Secondary | ICD-10-CM

## 2014-11-10 DIAGNOSIS — K219 Gastro-esophageal reflux disease without esophagitis: Secondary | ICD-10-CM | POA: Diagnosis not present

## 2014-11-10 DIAGNOSIS — Z79899 Other long term (current) drug therapy: Secondary | ICD-10-CM | POA: Insufficient documentation

## 2014-11-10 DIAGNOSIS — X19XXXA Contact with other heat and hot substances, initial encounter: Secondary | ICD-10-CM | POA: Insufficient documentation

## 2014-11-10 DIAGNOSIS — Z7951 Long term (current) use of inhaled steroids: Secondary | ICD-10-CM | POA: Diagnosis not present

## 2014-11-10 DIAGNOSIS — Z9981 Dependence on supplemental oxygen: Secondary | ICD-10-CM | POA: Diagnosis not present

## 2014-11-10 DIAGNOSIS — T24232A Burn of second degree of left lower leg, initial encounter: Secondary | ICD-10-CM | POA: Insufficient documentation

## 2014-11-10 DIAGNOSIS — I10 Essential (primary) hypertension: Secondary | ICD-10-CM | POA: Insufficient documentation

## 2014-11-10 DIAGNOSIS — G4733 Obstructive sleep apnea (adult) (pediatric): Secondary | ICD-10-CM | POA: Insufficient documentation

## 2014-11-10 DIAGNOSIS — Y998 Other external cause status: Secondary | ICD-10-CM | POA: Diagnosis not present

## 2014-11-10 DIAGNOSIS — Z87891 Personal history of nicotine dependence: Secondary | ICD-10-CM | POA: Diagnosis not present

## 2014-11-10 DIAGNOSIS — Y9289 Other specified places as the place of occurrence of the external cause: Secondary | ICD-10-CM | POA: Diagnosis not present

## 2014-11-10 DIAGNOSIS — Z7901 Long term (current) use of anticoagulants: Secondary | ICD-10-CM | POA: Insufficient documentation

## 2014-11-10 DIAGNOSIS — M109 Gout, unspecified: Secondary | ICD-10-CM | POA: Insufficient documentation

## 2014-11-10 DIAGNOSIS — Z8673 Personal history of transient ischemic attack (TIA), and cerebral infarction without residual deficits: Secondary | ICD-10-CM | POA: Diagnosis not present

## 2014-11-10 DIAGNOSIS — T24032A Burn of unspecified degree of left lower leg, initial encounter: Secondary | ICD-10-CM | POA: Diagnosis present

## 2014-11-10 MED ORDER — CEPHALEXIN 500 MG PO CAPS: 500.0000 mg | ORAL_CAPSULE | Freq: Four times a day (QID) | ORAL | Status: DC

## 2014-11-10 MED ORDER — SILVER SULFADIAZINE 1 % EX CREA
TOPICAL_CREAM | Freq: Once | CUTANEOUS | Status: AC
  Administered 2014-11-10: 06:00:00 via TOPICAL
  Filled 2014-11-10: qty 85

## 2014-11-10 MED ORDER — CEPHALEXIN 250 MG PO CAPS
500.0000 mg | ORAL_CAPSULE | Freq: Once | ORAL | Status: AC
  Administered 2014-11-10: 500 mg via ORAL
  Filled 2014-11-10: qty 2

## 2014-11-10 MED ORDER — SILVER SULFADIAZINE 1 % EX CREA: TOPICAL_CREAM | Freq: Two times a day (BID) | CUTANEOUS | Status: DC

## 2014-11-10 NOTE — ED Notes (Signed)
Pt presents via POV for small quarter sized burn noted to the medial LEFT knee that occurred 3 days ago; pt states he burned leg on exhaust of lawn mower; pt reports cleaning wound at home with peroxide and keeping it covered with bandages but he has a hx of cellulitis and is concerned; pt reports pain is 4/10; wound is reddened

## 2014-11-10 NOTE — Discharge Instructions (Signed)
Burn Care Your skin is a natural barrier to infection. It is the largest organ of your body. Burns damage this natural protection. To help prevent infection, it is very important to follow your caregiver's instructions in the care of your burn. Burns are classified as:  First degree. There is only redness of the skin (erythema). No scarring is expected.  Second degree. There is blistering of the skin. Scarring may occur with deeper burns.  Third degree. All layers of the skin are injured, and scarring is expected. HOME CARE INSTRUCTIONS   Wash your hands well before changing your bandage.  Change your bandage as often as directed by your caregiver.  Remove the old bandage. If the bandage sticks, you may soak it off with cool, clean water.  Cleanse the burn thoroughly but gently with mild soap and water.  Pat the area dry with a clean, dry cloth.  Apply a thin layer of antibacterial cream to the burn.  Apply a clean bandage as instructed by your caregiver.  Keep the bandage as clean and dry as possible.  Elevate the affected area for the first 24 hours, then as instructed by your caregiver.  Only take over-the-counter or prescription medicines for pain, discomfort, or fever as directed by your caregiver. SEEK IMMEDIATE MEDICAL CARE IF:   You develop excessive pain.  You develop redness, tenderness, swelling, or red streaks near the burn.  The burned area develops yellowish-white fluid (pus) or a bad smell.  You have a fever. MAKE SURE YOU:   Understand these instructions.  Will watch your condition.  Will get help right away if you are not doing well or get worse. Document Released: 03/03/2005 Document Revised: 05/26/2011 Document Reviewed: 07/24/2010 [employer] Patient Information 2015 [employer]. This information is not intended to replace advice given to you by your health care provider. Make sure you discuss any questions you have with your health care  provider.  

## 2014-11-10 NOTE — ED Provider Notes (Signed)
CSN: [id number]     Arrival date & time 11/10/14  5170 History   First MD Initiated Contact with Patient 11/10/14 0600     Chief Complaint  Patient presents with  . Burn     (Consider location/radiation/quality/duration/timing/severity/associated sxs/prior Treatment) HPI 69 year old male presents to the emergency department from home with complaint of burn to left medial knee.  Patient reports that he bumped into a hot exhaust pipe causing the burn.  Patient has been treating it with topical Triple Antibiotic ointment.  He is concerned as there is some redness around the area.  He has prior history of cellulitis.  He reports prediabetes.  He denies any fever or chills.  He reports the area aches at times.  He has had no drainage from the area. Past Medical History  Diagnosis Date  . GERD (gastroesophageal reflux disease)   . Gout   . Hyperlipidemia   . Hypertension   . CVA (cerebral infarction)     left hemiparesis  . Chronic pain     left sided-Kristeins  . CAD (coronary artery disease)     s/p CABG, s/p DES to LCX  January 2011  . OSA on CPAP   . Sleep apnea   . History of colonoscopy   . Stroke   . Dysrhythmia     hx of atrial fibrilation with cardioversion   Past Surgical History  Procedure Laterality Date  . Coronary artery bypass graft      stent  . Esophagogastroduodenoscopy  03-25-2005  . Nasal septum surgery    . Percutaneous placement intravascular stent cervical carotid artery      03-2009; using a drug-eluting platform of the circumflex cornoray artery with a 3.0 x 18 [employer] Promus drug-eluting platform post dilated to 3.75 with a noncompliant balloon.  . Cataract extraction  05/2010    left eye  . Cataract extraction  04/2010    right eye  . Cardioversion  06/20/2011    Procedure: CARDIOVERSION;  Surgeon: Larey Dresser, MD;  Location: [facility] ENDOSCOPY;  Service: Cardiovascular;  Laterality: N/A;  . Tee without cardioversion  06/20/2011    Procedure:  TRANSESOPHAGEAL ECHOCARDIOGRAM (TEE);  Surgeon: Larey Dresser, MD;  Location: [facility] ENDOSCOPY;  Service: Cardiovascular;  Laterality: N/A;   Family History  Problem Relation Age of Onset  . Lung cancer Father     deceased  . Stroke Mother     deceased-MINISTROKES   Social History  Substance Use Topics  . Smoking status: Former Research scientist (life sciences)  . Smokeless tobacco: Never Used  . Alcohol Use: No     Comment: 1 beer a month    Review of Systems  See History of Present Illness; otherwise all other systems are reviewed and negative  Allergies  Review of patient's allergies indicates no known allergies.  Home Medications   Prior to Admission medications   Medication Sig Start Date End Date Taking? Authorizing Provider  allopurinol (ZYLOPRIM) 100 MG tablet TAKE 2 TABLETS BY MOUTH DAILY.  D/C PREVIOUS SCRIPTS FOR THIS MEDICATION 06/23/14  Yes Debbrah Alar, NP  atorvastatin (LIPITOR) 40 MG tablet Take 40 mg by mouth daily at 6 PM.  09/17/14  Yes Historical Provider, MD  benazepril (LOTENSIN) 10 MG tablet TAKE 1 TABLET BY MOUTH DAILY D/C PREVIOUS SCRIPTS FOR THIS MEDICATION 10/23/14  Yes Debbrah Alar, NP  diltiazem (CARDIZEM CD) 180 MG 24 hr capsule TAKE 1 CAPSULE (180 MG TOTAL) BY MOUTH DAILY.  D/C PREVIOUS SCRIPTS FOR THIS MEDICATION 06/23/14  Yes Debbrah Alar, NP  fluticasone (FLONASE) 50 MCG/ACT nasal spray Place 2 sprays into both nostrils daily. 06/02/14  Yes Brunetta Jeans, PA-C  furosemide (LASIX) 40 MG tablet Take 1 tablet (40 mg total) by mouth every other day. 06/23/14  Yes Debbrah Alar, NP  levothyroxine (SYNTHROID, LEVOTHROID) 112 MCG tablet TAKE 1 TABLET (112 MCG TOTAL) BY MOUTH DAILY BEFORE BREAKFAST. 06/23/14  Yes Debbrah Alar, NP  metFORMIN (GLUCOPHAGE) 500 MG tablet TAKE 500 MG BY MOUTH DAILY WITH BREAKFAST.  D/C PREVIOUS SCRIPTS FOR THIS MEDICATION Patient taking differently: Take 500 mg by mouth daily with breakfast.  06/23/14  Yes Debbrah Alar, NP  Multiple  Vitamins-Minerals (MULTIVITAMIN GUMMIES MENS PO) Take 1 tablet by mouth daily.   Yes Historical Provider, MD  nitroGLYCERIN (NITROSTAT) 0.4 MG SL tablet Place 0.4 mg under the tongue every 5 (five) minutes as needed for chest pain. Up to 3 doses   Yes Historical Provider, MD  pantoprazole (PROTONIX) 40 MG tablet Take 1 tablet (40 mg total) by mouth daily. 09/26/14  Yes Debbrah Alar, NP  traMADol (ULTRAM) 50 MG tablet Take 1 tablet (50 mg total) by mouth every 6 (six) hours as needed. Patient taking differently: Take 50 mg by mouth 2 (two) times daily as needed for moderate pain.  09/14/14  Yes Charlett Blake, MD  traMADol (ULTRAM-ER) 200 MG 24 hr tablet TAKE 1 TABLET EVERY DAY WITH AM DOSE OF TRAMADOL 50 MG Patient taking differently: Take 200 mg by mouth every morning.  09/14/14  Yes Charlett Blake, MD  vitamin B-12 1000 MCG tablet Take 1 tablet (1,000 mcg total) by mouth daily. 08/11/13  Yes Orson Eva, MD  warfarin (COUMADIN) 2.5 MG tablet Take 2.5 mg by mouth daily.   Yes Historical Provider, MD  atorvastatin (LIPITOR) 80 MG tablet Take 1 tablet (80 mg total) by mouth daily. Patient not taking: Reported on 10/08/2014 09/28/14   Debbrah Alar, NP  warfarin (COUMADIN) 2.5 MG tablet TAKE AS DIRECTED BY ANTICOAGULATION CLINIC Patient not taking: Reported on 11/10/2014 10/30/14   Larey Dresser, MD   BP 148/68 mmHg  Pulse 70  Temp(Src) 98.1 F (36.7 C) (Oral)  Resp 18  SpO2 95% Physical Exam  Musculoskeletal:  Patient with 2 cm wound with some surrounding mild erythema.  There is no warmth or fluctuance.  There is no active drainage.  The base of the wound appears clean, dry and intact.    ED Course  Procedures (including critical care time) Labs Review Labs Reviewed - No data to display  Imaging Review No results found. I have personally reviewed and evaluated these images and lab results as part of my medical decision-making.   EKG Interpretation None      MDM    Final diagnoses:  Burn of left leg, second degree, initial encounter   69 year old male status post burn.  Plan for Silvadene and dressing changes twice a day, no further use of peroxide, and coverage with Keflex.  Patient to follow-up with his primary care doctor in 2 days for recheck.  Linton Flemings, MD 11/10/14 [phone]

## 2014-11-14 ENCOUNTER — Ambulatory Visit (INDEPENDENT_AMBULATORY_CARE_PROVIDER_SITE_OTHER): Payer: Medicare Other | Admitting: *Deleted

## 2014-11-14 DIAGNOSIS — I4891 Unspecified atrial fibrillation: Secondary | ICD-10-CM | POA: Diagnosis not present

## 2014-11-14 DIAGNOSIS — Z7901 Long term (current) use of anticoagulants: Secondary | ICD-10-CM

## 2014-11-14 LAB — POCT INR: INR: 2.2

## 2014-11-25 ENCOUNTER — Emergency Department (HOSPITAL_COMMUNITY): Payer: Medicare Other

## 2014-11-25 ENCOUNTER — Emergency Department (HOSPITAL_COMMUNITY)
Admission: EM | Admit: 2014-11-25 | Discharge: 2014-11-25 | Disposition: A | Discharge status: Home / Self Care | Payer: Medicare Other | Attending: Emergency Medicine | Admitting: Emergency Medicine

## 2014-11-25 ENCOUNTER — Encounter (HOSPITAL_COMMUNITY): Payer: Self-pay | Admitting: *Deleted

## 2014-11-25 DIAGNOSIS — Z79899 Other long term (current) drug therapy: Secondary | ICD-10-CM | POA: Insufficient documentation

## 2014-11-25 DIAGNOSIS — I10 Essential (primary) hypertension: Secondary | ICD-10-CM | POA: Diagnosis not present

## 2014-11-25 DIAGNOSIS — Z792 Long term (current) use of antibiotics: Secondary | ICD-10-CM | POA: Insufficient documentation

## 2014-11-25 DIAGNOSIS — R51 Headache: Secondary | ICD-10-CM | POA: Diagnosis not present

## 2014-11-25 DIAGNOSIS — Z7951 Long term (current) use of inhaled steroids: Secondary | ICD-10-CM | POA: Insufficient documentation

## 2014-11-25 DIAGNOSIS — G8929 Other chronic pain: Secondary | ICD-10-CM | POA: Insufficient documentation

## 2014-11-25 DIAGNOSIS — Z8673 Personal history of transient ischemic attack (TIA), and cerebral infarction without residual deficits: Secondary | ICD-10-CM | POA: Insufficient documentation

## 2014-11-25 DIAGNOSIS — Z87891 Personal history of nicotine dependence: Secondary | ICD-10-CM | POA: Insufficient documentation

## 2014-11-25 DIAGNOSIS — M109 Gout, unspecified: Secondary | ICD-10-CM | POA: Insufficient documentation

## 2014-11-25 DIAGNOSIS — Z7901 Long term (current) use of anticoagulants: Secondary | ICD-10-CM | POA: Diagnosis not present

## 2014-11-25 DIAGNOSIS — G4733 Obstructive sleep apnea (adult) (pediatric): Secondary | ICD-10-CM | POA: Insufficient documentation

## 2014-11-25 DIAGNOSIS — Z9981 Dependence on supplemental oxygen: Secondary | ICD-10-CM | POA: Insufficient documentation

## 2014-11-25 DIAGNOSIS — G43909 Migraine, unspecified, not intractable, without status migrainosus: Secondary | ICD-10-CM | POA: Diagnosis not present

## 2014-11-25 DIAGNOSIS — E785 Hyperlipidemia, unspecified: Secondary | ICD-10-CM | POA: Insufficient documentation

## 2014-11-25 DIAGNOSIS — K219 Gastro-esophageal reflux disease without esophagitis: Secondary | ICD-10-CM | POA: Diagnosis not present

## 2014-11-25 DIAGNOSIS — I2510 Atherosclerotic heart disease of native coronary artery without angina pectoris: Secondary | ICD-10-CM | POA: Insufficient documentation

## 2014-11-25 DIAGNOSIS — R002 Palpitations: Secondary | ICD-10-CM | POA: Diagnosis not present

## 2014-11-25 DIAGNOSIS — R5383 Other fatigue: Secondary | ICD-10-CM

## 2014-11-25 DIAGNOSIS — G459 Transient cerebral ischemic attack, unspecified: Secondary | ICD-10-CM

## 2014-11-25 DIAGNOSIS — R519 Headache, unspecified: Secondary | ICD-10-CM

## 2014-11-25 LAB — HEPATIC FUNCTION PANEL
ALBUMIN: 3.8 g/dL (ref 3.5–5.0)
ALT: 38 U/L (ref 17–63)
AST: 38 U/L (ref 15–41)
Alkaline Phosphatase: 59 U/L (ref 38–126)
Bilirubin, Direct: 0.2 mg/dL (ref 0.1–0.5)
Indirect Bilirubin: 1 mg/dL — ABNORMAL HIGH (ref 0.3–0.9)
TOTAL PROTEIN: 6.9 g/dL (ref 6.5–8.1)
Total Bilirubin: 1.2 mg/dL (ref 0.3–1.2)

## 2014-11-25 LAB — I-STAT TROPONIN, ED: Troponin i, poc: 0.01 ng/mL (ref 0.00–0.08)

## 2014-11-25 LAB — URINALYSIS, ROUTINE W REFLEX MICROSCOPIC
Bilirubin Urine: NEGATIVE
Glucose, UA: NEGATIVE mg/dL
Hgb urine dipstick: NEGATIVE
KETONES UR: NEGATIVE mg/dL
LEUKOCYTES UA: NEGATIVE
NITRITE: NEGATIVE
Protein, ur: NEGATIVE mg/dL
SPECIFIC GRAVITY, URINE: 1.025 (ref 1.005–1.030)
Urobilinogen, UA: 1 mg/dL (ref 0.0–1.0)
pH: 6 (ref 5.0–8.0)

## 2014-11-25 LAB — BASIC METABOLIC PANEL
ANION GAP: 6 (ref 5–15)
BUN: 12 mg/dL (ref 6–20)
CHLORIDE: 99 mmol/L — AB (ref 101–111)
CO2: 31 mmol/L (ref 22–32)
Calcium: 9.1 mg/dL (ref 8.9–10.3)
Creatinine, Ser: 1.1 mg/dL (ref 0.61–1.24)
GFR calc Af Amer: >60 mL/min (ref >60)
GFR calc non Af Amer: >60 mL/min (ref >60)
Glucose, Bld: 111 mg/dL — ABNORMAL HIGH (ref 65–99)
POTASSIUM: 4.6 mmol/L (ref 3.5–5.1)
Sodium: 136 mmol/L (ref 135–145)

## 2014-11-25 LAB — CBC
HEMATOCRIT: 41.3 % (ref 39.0–52.0)
HEMOGLOBIN: 13.4 g/dL (ref 13.0–17.0)
MCH: 28.4 pg (ref 26.0–34.0)
MCHC: 32.4 g/dL (ref 30.0–36.0)
MCV: 87.5 fL (ref 78.0–100.0)
Platelets: 163 10*3/uL (ref 150–400)
RBC: 4.72 MIL/uL (ref 4.22–5.81)
RDW: 13.9 % (ref 11.5–15.5)
WBC: 4.5 10*3/uL (ref 4.0–10.5)

## 2014-11-25 LAB — I-STAT CG4 LACTIC ACID, ED: LACTIC ACID, VENOUS: 0.94 mmol/L (ref 0.5–2.0)

## 2014-11-25 LAB — MAGNESIUM: Magnesium: 2.1 mg/dL (ref 1.7–2.4)

## 2014-11-25 NOTE — ED Notes (Signed)
MD at bedside. 

## 2014-11-25 NOTE — ED Provider Notes (Signed)
CSN: [id number]     Arrival date & time 11/25/14  0907 History   First MD Initiated Contact with Patient 11/25/14 1126     Chief Complaint  Patient presents with  . Palpitations   HPI  [name] is a 69 year old male with PMHx of a fib on coumadin, CAD s/p CABG in 2011 and stroke with left sided deficits presenting with feelings of arrhythmia. Pt states he was straining with a bowel movement yesterday and felt that his heart came out of rhythm. Pt was found to be in a fib with RVR in 2013 and was successfully cardioverted. He has not been in a fib since. He states he is not having palpitations or racing heart but last time he was in a fib he felt "sluggish" and that was his only symptom. He states after his bowel movement he felt tired and sluggish. He states that he woke this morning and still felt sluggish and like "when I'm walking, it's like 2 steps forward and 1 step back". Pt has a residual limp favoring the left leg from a prior stroke. Pt denies falls or weakness. Pt had a stroke in 1996 and has residual left sided numbness affecting arm, trunk and leg. Pt also endorsing mild headache. Denies fevers, chest pain, SOB, abdominal pain, nausea, vomiting or changes in extremity weakness.   Past Medical History  Diagnosis Date  . GERD (gastroesophageal reflux disease)   . Gout   . Hyperlipidemia   . Hypertension   . CVA (cerebral infarction)     left hemiparesis  . Chronic pain     left sided-[place]  . CAD (coronary artery disease)     s/p CABG, s/p DES to LCX  January 2011  . OSA on CPAP   . Sleep apnea   . History of colonoscopy   . Stroke   . Dysrhythmia     hx of atrial fibrilation with cardioversion   Past Surgical History  Procedure Laterality Date  . Coronary artery bypass graft      stent  . Esophagogastroduodenoscopy  03-25-2005  . Nasal septum surgery    . Percutaneous placement intravascular stent cervical carotid artery      03-2009; using a drug-eluting platform of  the circumflex cornoray artery with a 3.0 x 18 [employer] Promus drug-eluting platform post dilated to 3.75 with a noncompliant balloon.  . Cataract extraction  05/2010    left eye  . Cataract extraction  04/2010    right eye  . Cardioversion  06/20/2011    Procedure: CARDIOVERSION;  Surgeon: Larey Dresser, MD;  Location: [facility] ENDOSCOPY;  Service: Cardiovascular;  Laterality: N/A;  . Tee without cardioversion  06/20/2011    Procedure: TRANSESOPHAGEAL ECHOCARDIOGRAM (TEE);  Surgeon: Larey Dresser, MD;  Location: [facility] ENDOSCOPY;  Service: Cardiovascular;  Laterality: N/A;   Family History  Problem Relation Age of Onset  . Lung cancer Father     deceased  . Stroke Mother     deceased-MINISTROKES   Social History  Substance Use Topics  . Smoking status: Former Research scientist (life sciences)  . Smokeless tobacco: Never Used  . Alcohol Use: No     Comment: 1 beer a month    Review of Systems  Constitutional: Positive for fatigue. Negative for fever and chills.  Respiratory: Negative for shortness of breath.   Cardiovascular: Negative for chest pain and palpitations.  Gastrointestinal: Negative for nausea, vomiting and abdominal pain.  Musculoskeletal: Positive for gait problem.  Neurological: Positive  for numbness and headaches. Negative for syncope, facial asymmetry, speech difficulty and weakness.  Hematological: Bruises/bleeds easily.      Allergies  Review of patient's allergies indicates no known allergies.  Home Medications   Prior to Admission medications   Medication Sig Start Date End Date Taking? Authorizing Provider  allopurinol (ZYLOPRIM) 100 MG tablet TAKE 2 TABLETS BY MOUTH DAILY.  D/C PREVIOUS SCRIPTS FOR THIS MEDICATION 06/23/14   Debbrah Alar, NP  atorvastatin (LIPITOR) 40 MG tablet Take 40 mg by mouth daily at 6 PM.  09/17/14   Historical Provider, MD  atorvastatin (LIPITOR) 80 MG tablet Take 1 tablet (80 mg total) by mouth daily. Patient not taking: Reported on 10/08/2014  09/28/14   Debbrah Alar, NP  benazepril (LOTENSIN) 10 MG tablet TAKE 1 TABLET BY MOUTH DAILY D/C PREVIOUS SCRIPTS FOR THIS MEDICATION 10/23/14   Debbrah Alar, NP  cephALEXin (KEFLEX) 500 MG capsule Take 1 capsule (500 mg total) by mouth 4 (four) times daily. 11/10/14   Linton Flemings, MD  diltiazem (CARDIZEM CD) 180 MG 24 hr capsule TAKE 1 CAPSULE (180 MG TOTAL) BY MOUTH DAILY.  D/C PREVIOUS SCRIPTS FOR THIS MEDICATION 06/23/14   Debbrah Alar, NP  fluticasone (FLONASE) 50 MCG/ACT nasal spray Place 2 sprays into both nostrils daily. 06/02/14   Brunetta Jeans, PA-C  furosemide (LASIX) 40 MG tablet Take 1 tablet (40 mg total) by mouth every other day. 06/23/14   Debbrah Alar, NP  levothyroxine (SYNTHROID, LEVOTHROID) 112 MCG tablet TAKE 1 TABLET (112 MCG TOTAL) BY MOUTH DAILY BEFORE BREAKFAST. 06/23/14   Debbrah Alar, NP  metFORMIN (GLUCOPHAGE) 500 MG tablet TAKE 500 MG BY MOUTH DAILY WITH BREAKFAST.  D/C PREVIOUS SCRIPTS FOR THIS MEDICATION Patient taking differently: Take 500 mg by mouth daily with breakfast.  06/23/14   Debbrah Alar, NP  Multiple Vitamins-Minerals (MULTIVITAMIN GUMMIES MENS PO) Take 1 tablet by mouth daily.    Historical Provider, MD  nitroGLYCERIN (NITROSTAT) 0.4 MG SL tablet Place 0.4 mg under the tongue every 5 (five) minutes as needed for chest pain. Up to 3 doses    Historical Provider, MD  pantoprazole (PROTONIX) 40 MG tablet Take 1 tablet (40 mg total) by mouth daily. 09/26/14   Debbrah Alar, NP  silver sulfADIAZINE (SILVADENE) 1 % cream Apply topically 2 (two) times daily. X 5 days 11/10/14   Linton Flemings, MD  traMADol (ULTRAM) 50 MG tablet Take 1 tablet (50 mg total) by mouth every 6 (six) hours as needed. Patient taking differently: Take 50 mg by mouth 2 (two) times daily as needed for moderate pain.  09/14/14   Charlett Blake, MD  traMADol (ULTRAM-ER) 200 MG 24 hr tablet TAKE 1 TABLET EVERY DAY WITH AM DOSE OF TRAMADOL 50 MG Patient taking  differently: Take 200 mg by mouth every morning.  09/14/14   Charlett Blake, MD  vitamin B-12 1000 MCG tablet Take 1 tablet (1,000 mcg total) by mouth daily. 08/11/13   Orson Eva, MD  warfarin (COUMADIN) 2.5 MG tablet TAKE AS DIRECTED BY ANTICOAGULATION CLINIC Patient not taking: Reported on 11/10/2014 10/30/14   Larey Dresser, MD  warfarin (COUMADIN) 2.5 MG tablet Take 2.5 mg by mouth daily.    Historical Provider, MD   BP 154/72 mmHg  Pulse 59  Temp(Src) 97.8 F (36.6 C) (Oral)  Resp 19  Ht 6\' 3"  (1.905 m)  Wt 260 lb (117.935 kg)  BMI 32.50 kg/m2  SpO2 94% Physical Exam  Constitutional: He is oriented to  person, place, and time. He appears well-developed and well-nourished. No distress.  HENT:  Head: Normocephalic and atraumatic.  Eyes: Conjunctivae and EOM are normal. Pupils are equal, round, and reactive to light.  Neck: Normal range of motion.  Cardiovascular: Normal rate, regular rhythm and normal heart sounds.   Pulmonary/Chest: Breath sounds normal. No respiratory distress. He has no wheezes. He has no rales.  Abdominal: Soft. There is no tenderness.  Musculoskeletal: Normal range of motion.  Moves all extremities spontaneously.  Neurological: He is alert and oriented to person, place, and time.  5/5 motor strength of extremities. Sensation of light touch throughout. Pt reports numbness to left extremities and trunk. Sensation to light touch intact throughout  Skin: Skin is warm and dry.  Psychiatric: He has a normal mood and affect. His behavior is normal.  Nursing note and vitals reviewed.   ED Course  Procedures (including critical care time) Labs Review Labs Reviewed  BASIC METABOLIC PANEL - Abnormal; Notable for the following:    Chloride 99 (*)    Glucose, Bld 111 (*)    All other components within normal limits  HEPATIC FUNCTION PANEL - Abnormal; Notable for the following:    Indirect Bilirubin 1.0 (*)    All other components within normal limits  CBC   URINALYSIS, ROUTINE W REFLEX MICROSCOPIC (NOT AT [facility])  MAGNESIUM  I-STAT TROPOININ, ED  I-STAT CG4 LACTIC ACID, ED    Imaging Review Dg Chest 2 View  11/25/2014   CLINICAL DATA:  Palpitations.  EXAM: CHEST  2 VIEW  COMPARISON:  08/25/2014  FINDINGS: Previous median sternotomy and CABG procedure. Heart size is normal. There is asymmetric elevation of the right hemidiaphragm. No pleural effusion or edema identified. No airspace consolidation.  IMPRESSION: 1. No acute cardiopulmonary abnormalities. 2. Asymmetric elevation of right hemidiaphragm.   Electronically Signed   By: Kerby Moors M.D.   On: 11/25/2014 11:27   Mr Brain Wo Contrast  11/25/2014   CLINICAL DATA:  Eighth left-sided weakness. Occasional migraine headaches.  EXAM: MRI HEAD WITHOUT CONTRAST  TECHNIQUE: Multiplanar, multiecho pulse sequences of the brain and surrounding structures were obtained without intravenous contrast.  COMPARISON:  CT head without contrast 08/16/2014  FINDINGS: The diffusion-weighted images demonstrate no evidence for acute or subacute infarction. Remote hemorrhagic infarcts involving the right basal ganglia are again noted. An additional more anterior right basal ganglia infarct is present since prior exam. This is not acute.  Confluent periventricular and scattered subcortical T2 changes are otherwise similar.  Ventricles are proportionate to the degree of atrophy.  Flow is present in the major intracranial arteries. The study is moderately degraded by patient motion.  Circumferential mucosal thickening is present in the maxillary sinuses bilaterally and scattered throughout the ethmoid air cells. The mastoid air cells are clear bilaterally.  IMPRESSION: 1. The right basal ganglia hemorrhagic infarcts have expanded since the prior exam without evidence for acute infarct. 2. Atrophy and diffuse white matter disease is otherwise stable. 3. The study is moderately degraded by patient motion.   Electronically Signed    By: San Morelle M.D.   On: 11/25/2014 16:15   I have personally reviewed and evaluated these images and lab results as part of my medical decision-making.   EKG Interpretation None     Daughter arrived and spoke with MDM   Final diagnoses:  Headache, unspecified headache type  Other fatigue   Pt presenting due to concerns that he was in a fib. Pt had bowel movement  yesterday and reports feeling "weird and sluggish" afterwards. Pt states these were similar symptoms to last time he was in a fib. Denies chest pain, palpitations, SOB, syncope, weakness. Also complaining of mild headache. VSS. Pt nontoxic. Non-focal neuro exam. Heart RRR. Lungs CTAB. EKG showing pt in normal sinus rhythm. CBC, BMP, UA unremarkable. Lactic acid 0.94. Troponin 0.01. MR brain shows no acute stroke. Pt to follow up with PCP in next week if fatigue does not resolve. Return precautions given in discharge paperwork and discussed with pt. Pt seen in conjunction with Dr. Roderic Palau who agrees with the plan. Pt stable for discharge    Josephina Gip, PA-C 11/25/14 1906  Milton Ferguson, MD 11/26/14 [phone]

## 2014-11-25 NOTE — Discharge Instructions (Signed)
Fatigue Fatigue is a feeling of tiredness, lack of energy, lack of motivation, or feeling tired all the time. Having enough rest, good nutrition, and reducing stress will normally reduce fatigue. Consult your caregiver if it persists. The nature of your fatigue will help your caregiver to find out its cause. The treatment is based on the cause.  CAUSES  There are many causes for fatigue. Most of the time, fatigue can be traced to one or more of your habits or routines. Most causes fit into one or more of three general areas. They are: Lifestyle problems  Sleep disturbances.  Overwork.  Physical exertion.  Unhealthy habits.  Poor eating habits or eating disorders.  Alcohol and/or drug use .  Lack of proper nutrition (malnutrition). Psychological problems  Stress and/or anxiety problems.  Depression.  Grief.  Boredom. Medical Problems or Conditions  Anemia.  Pregnancy.  Thyroid gland problems.  Recovery from major surgery.  Continuous pain.  Emphysema or asthma that is not well controlled  Allergic conditions.  Diabetes.  Infections (such as mononucleosis).  Obesity.  Sleep disorders, such as sleep apnea.  Heart failure or other heart-related problems.  Cancer.  Kidney disease.  Liver disease.  Effects of certain medicines such as antihistamines, cough and cold remedies, prescription pain medicines, heart and blood pressure medicines, drugs used for treatment of cancer, and some antidepressants. SYMPTOMS  The symptoms of fatigue include:   Lack of energy.  Lack of drive (motivation).  Drowsiness.  Feeling of indifference to the surroundings. DIAGNOSIS  The details of how you feel help guide your caregiver in finding out what is causing the fatigue. You will be asked about your present and past health condition. It is important to review all medicines that you take, including prescription and non-prescription items. A thorough exam will be done.  You will be questioned about your feelings, habits, and normal lifestyle. Your caregiver may suggest blood tests, urine tests, or other tests to look for common medical causes of fatigue.  TREATMENT  Fatigue is treated by correcting the underlying cause. For example, if you have continuous pain or depression, treating these causes will improve how you feel. Similarly, adjusting the dose of certain medicines will help in reducing fatigue.  HOME CARE INSTRUCTIONS   Try to get the required amount of good sleep every night.  Eat a healthy and nutritious diet, and drink enough water throughout the day.  Practice ways of relaxing (including yoga or meditation).  Exercise regularly.  Make plans to change situations that cause stress. Act on those plans so that stresses decrease over time. Keep your work and personal routine reasonable.  Avoid street drugs and minimize use of alcohol.  Start taking a daily multivitamin after consulting your caregiver. SEEK MEDICAL CARE IF:   You have persistent tiredness, which cannot be accounted for.  You have fever.  You have unintentional weight loss.  You have headaches.  You have disturbed sleep throughout the night.  You are feeling sad.  You have constipation.  You have dry skin.  You have gained weight.  You are taking any new or different medicines that you suspect are causing fatigue.  You are unable to sleep at night.  You develop any unusual swelling of your legs or other parts of your body. SEEK IMMEDIATE MEDICAL CARE IF:   You are feeling confused.  Your vision is blurred.  You feel faint or pass out.  You develop severe headache.  You develop severe abdominal, pelvic, or  back pain.  You develop chest pain, shortness of breath, or an irregular or fast heartbeat.  You are unable to pass a normal amount of urine.  You develop abnormal bleeding such as bleeding from the rectum or you vomit blood.  You have thoughts  about harming yourself or committing suicide.  You are worried that you might harm someone else. MAKE SURE YOU:   Understand these instructions.  Will watch your condition.  Will get help right away if you are not doing well or get worse. Document Released: 12/29/2006 Document Revised: 05/26/2011 Document Reviewed: 07/05/2013 [facility] Patient Information 2015 [place], [place]. This information is not intended to replace advice given to you by your health care provider. Make sure you discuss any questions you have with your health care provider.  General Headache Without Cause A general headache is pain or discomfort felt around the head or neck area. The cause may not be found.  HOME CARE   Keep all doctor visits.  Only take medicines as told by your doctor.  Lie down in a dark, quiet room when you have a headache.  Keep a journal to find out if certain things bring on headaches. For example, write down:  What you eat and drink.  How much sleep you get.  Any change to your diet or medicines.  Relax by getting a massage or doing other relaxing activities.  Put ice or heat packs on the head and neck area as told by your doctor.  Lessen stress.  Sit up straight. Do not tighten (tense) your muscles.  Quit smoking if you smoke.  Lessen how much alcohol you drink.  Lessen how much caffeine you drink, or stop drinking caffeine.  Eat and sleep on a regular schedule.  Get 7 to 9 hours of sleep, or as told by your doctor.  Keep lights dim if bright lights bother you or make your headaches worse. GET HELP RIGHT AWAY IF:   Your headache becomes really bad.  You have a fever.  You have a stiff neck.  You have trouble seeing.  Your muscles are weak, or you lose muscle control.  You lose your balance or have trouble walking.  You feel like you will pass out (faint), or you pass out.  You have really bad symptoms that are different than your first symptoms.  You  have problems with the medicines given to you by your doctor.  Your medicines do not work.  Your headache feels different than the other headaches.  You feel sick to your stomach (nauseous) or throw up (vomit). MAKE SURE YOU:   Understand these instructions.  Will watch your condition.  Will get help right away if you are not doing well or get worse. Document Released: 12/11/2007 Document Revised: 05/26/2011 Document Reviewed: 02/21/2011 [facility] Patient Information 2015 [address], [place]. This information is not intended to replace advice given to you by your health care provider. Make sure you discuss any questions you have with your health care provider.

## 2014-11-25 NOTE — ED Notes (Signed)
Pt reports "feelling like heart is out of rhythm since straining to have a bowel movement yesterday morning." HR 67 at triage, denies cp or sob.

## 2014-11-25 NOTE — ED Notes (Addendum)
Prompted patient for a urine specimen.  Unable to provide one at this time.

## 2014-11-25 NOTE — ED Notes (Signed)
Patient transported to MRI 

## 2014-11-25 NOTE — ED Notes (Signed)
Called the main lab and spoke with [place].  I informed her of the add-on hepatic function panel and magnesium, she acknowledged and said that it could be done.

## 2014-11-26 ENCOUNTER — Telehealth: Payer: Self-pay | Admitting: Family

## 2014-11-26 NOTE — Telephone Encounter (Signed)
Please contact pt and arrange ED follow up with me this week.

## 2014-11-28 NOTE — Telephone Encounter (Signed)
Scheduled pt for 9/16 10:30am for ER f/u

## 2014-12-01 ENCOUNTER — Encounter: Payer: Self-pay | Admitting: Family

## 2014-12-01 ENCOUNTER — Ambulatory Visit: Payer: Medicare Other | Admitting: Family

## 2014-12-01 ENCOUNTER — Ambulatory Visit (INDEPENDENT_AMBULATORY_CARE_PROVIDER_SITE_OTHER): Payer: Medicare Other | Admitting: Family

## 2014-12-01 VITALS — BP 130/70 | HR 67 | Temp 97.9°F | Resp 18 | Ht 75.0 in | Wt 262.0 lb

## 2014-12-01 DIAGNOSIS — G4733 Obstructive sleep apnea (adult) (pediatric): Secondary | ICD-10-CM | POA: Diagnosis not present

## 2014-12-01 DIAGNOSIS — Z23 Encounter for immunization: Secondary | ICD-10-CM

## 2014-12-01 DIAGNOSIS — E878 Other disorders of electrolyte and fluid balance, not elsewhere classified: Secondary | ICD-10-CM

## 2014-12-01 DIAGNOSIS — I2510 Atherosclerotic heart disease of native coronary artery without angina pectoris: Secondary | ICD-10-CM

## 2014-12-01 DIAGNOSIS — R5382 Chronic fatigue, unspecified: Secondary | ICD-10-CM | POA: Diagnosis not present

## 2014-12-01 LAB — BASIC METABOLIC PANEL
BUN: 20 mg/dL (ref 6–23)
CALCIUM: 9.4 mg/dL (ref 8.4–10.5)
CO2: 30 meq/L (ref 19–32)
CREATININE: 0.98 mg/dL (ref 0.40–1.50)
Chloride: 103 mEq/L (ref 96–112)
GFR: 80.01 mL/min (ref >60.00)
Glucose, Bld: 105 mg/dL — ABNORMAL HIGH (ref 70–99)
Potassium: 4.2 mEq/L (ref 3.5–5.1)
SODIUM: 140 meq/L (ref 135–145)

## 2014-12-01 LAB — TSH: TSH: 0.81 u[IU]/mL (ref 0.35–4.50)

## 2014-12-01 NOTE — Progress Notes (Signed)
Pre visit review using our clinic review tool, if applicable. No additional management support is needed unless otherwise documented below in the visit note. 

## 2014-12-01 NOTE — Patient Instructions (Signed)
You will be contacted about your referral to Pulmonary (Dr. Annamaria Boots) to review sleep apnea. Complete lab work prior to leaving.  Follow up in 3 months.

## 2014-12-01 NOTE — Progress Notes (Signed)
Subjective:    Patient ID: [name], male    DOB: 17-Apr-1945, 69 y.o.   MRN: [id number]  HPI   [name] is a 69 yr old male who presents today for ER follow up. ER record is reviewed. He was evaluatedon 11/25/14 with report of feeling sluggish.  CXR was unremarkable, MRI brain noted that the right basal ganglia hemorrhagic infarcts had expanded since the prior exam but no evidence for acute infarct.  EKG revealed NSR.  Troponin neg.  UA negative.  CBC normal.  Mild hypochloremia (Cl- 99).    Pt reports that he continues to feel "washed out." he has hx of OSA- on hx oxygen, could not tolerate CPAP Reports 64+ oz of fluid a day.  Pt denies depression.   Review of Systems  HENT: Negative for rhinorrhea.   Respiratory: Negative for cough.   Cardiovascular: Negative for chest pain and palpitations.  Gastrointestinal: Negative for abdominal pain and diarrhea.  Genitourinary: Negative for dysuria and frequency.   See HPI  Past Medical History  Diagnosis Date  . GERD (gastroesophageal reflux disease)   . Gout   . Hyperlipidemia   . Hypertension   . CVA (cerebral infarction)     left hemiparesis  . Chronic pain     left sided-Kristeins  . CAD (coronary artery disease)     s/p CABG, s/p DES to LCX  January 2011  . OSA on CPAP   . Sleep apnea   . History of colonoscopy   . Stroke   . Dysrhythmia     hx of atrial fibrilation with cardioversion    Social History   Social History  . Marital Status: Married    Spouse Name: [name]  . Number of Children: N/A  . Years of Education: N/A   Occupational History  . retired Hydrographic surveyor   Social History Main Topics  . Smoking status: Former Research scientist (life sciences)  . Smokeless tobacco: Never Used  . Alcohol Use: No     Comment: 1 beer a month  . Drug Use: No  . Sexual Activity: No   Other Topics Concern  . Not on file   Social History Narrative    Past Surgical History  Procedure Laterality Date  . Coronary artery  bypass graft      stent  . Esophagogastroduodenoscopy  03-25-2005  . Nasal septum surgery    . Percutaneous placement intravascular stent cervical carotid artery      03-2009; using a drug-eluting platform of the circumflex cornoray artery with a 3.0 x 18 [employer] Promus drug-eluting platform post dilated to 3.75 with a noncompliant balloon.  . Cataract extraction  05/2010    left eye  . Cataract extraction  04/2010    right eye  . Cardioversion  06/20/2011    Procedure: CARDIOVERSION;  Surgeon: Larey Dresser, MD;  Location: [facility] ENDOSCOPY;  Service: Cardiovascular;  Laterality: N/A;  . Tee without cardioversion  06/20/2011    Procedure: TRANSESOPHAGEAL ECHOCARDIOGRAM (TEE);  Surgeon: Larey Dresser, MD;  Location: [facility] ENDOSCOPY;  Service: Cardiovascular;  Laterality: N/A;    Family History  Problem Relation Age of Onset  . Lung cancer Father     deceased  . Stroke Mother     deceased-MINISTROKES    No Known Allergies  Current Outpatient Prescriptions on File Prior to Visit  Medication Sig Dispense Refill  . allopurinol (ZYLOPRIM) 100 MG tablet TAKE 2 TABLETS BY MOUTH DAILY.  D/C PREVIOUS  SCRIPTS FOR THIS MEDICATION 180 tablet 1  . atorvastatin (LIPITOR) 80 MG tablet Take 1 tablet (80 mg total) by mouth daily. 90 tablet 0  . benazepril (LOTENSIN) 10 MG tablet TAKE 1 TABLET BY MOUTH DAILY D/C PREVIOUS SCRIPTS FOR THIS MEDICATION 90 tablet 1  . cephALEXin (KEFLEX) 500 MG capsule Take 1 capsule (500 mg total) by mouth 4 (four) times daily. 40 capsule 0  . diltiazem (CARDIZEM CD) 180 MG 24 hr capsule TAKE 1 CAPSULE (180 MG TOTAL) BY MOUTH DAILY.  D/C PREVIOUS SCRIPTS FOR THIS MEDICATION 90 capsule 1  . fluticasone (FLONASE) 50 MCG/ACT nasal spray Place 2 sprays into both nostrils daily. 9.9 g 1  . furosemide (LASIX) 40 MG tablet Take 1 tablet (40 mg total) by mouth every other day. 45 tablet 1  . levothyroxine (SYNTHROID, LEVOTHROID) 112 MCG tablet TAKE 1 TABLET (112 MCG TOTAL) BY  MOUTH DAILY BEFORE BREAKFAST. 90 tablet 1  . metFORMIN (GLUCOPHAGE) 500 MG tablet TAKE 500 MG BY MOUTH DAILY WITH BREAKFAST.  D/C PREVIOUS SCRIPTS FOR THIS MEDICATION (Patient taking differently: Take 500 mg by mouth daily with breakfast. ) 90 tablet 1  . Multiple Vitamins-Minerals (MULTIVITAMIN GUMMIES MENS PO) Take 1 tablet by mouth daily.    . nitroGLYCERIN (NITROSTAT) 0.4 MG SL tablet Place 0.4 mg under the tongue every 5 (five) minutes as needed for chest pain. Up to 3 doses    . pantoprazole (PROTONIX) 40 MG tablet Take 1 tablet (40 mg total) by mouth daily. 90 tablet 1  . silver sulfADIAZINE (SILVADENE) 1 % cream Apply topically 2 (two) times daily. X 5 days 50 g 0  . traMADol (ULTRAM) 50 MG tablet Take 1 tablet (50 mg total) by mouth every 6 (six) hours as needed. (Patient taking differently: Take 50 mg by mouth 2 (two) times daily as needed for moderate pain. ) 180 tablet 1  . traMADol (ULTRAM-ER) 200 MG 24 hr tablet TAKE 1 TABLET EVERY DAY WITH AM DOSE OF TRAMADOL 50 MG (Patient taking differently: Take 200 mg by mouth every morning. ) 90 tablet 1  . vitamin B-12 1000 MCG tablet Take 1 tablet (1,000 mcg total) by mouth daily. 30 tablet 0  . warfarin (COUMADIN) 2.5 MG tablet TAKE AS DIRECTED BY ANTICOAGULATION CLINIC 100 tablet 1  . warfarin (COUMADIN) 2.5 MG tablet Take 2.5 mg by mouth daily.     No current facility-administered medications on file prior to visit.    BP 130/70 mmHg  Pulse 67  Temp(Src) 97.9 F (36.6 C) (Oral)  Resp 18  Ht 6\' 3"  (1.905 m)  Wt 262 lb (118.842 kg)  BMI 32.75 kg/m2  SpO2 96%       Objective:   Physical Exam  Constitutional: He is oriented to person, place, and time. He appears well-developed and well-nourished. No distress.  HENT:  Head: Normocephalic and atraumatic.  Cardiovascular: Normal rate and regular rhythm.   No murmur heard. Pulmonary/Chest: Effort normal and breath sounds normal. No respiratory distress. He has no wheezes. He has no  rales.  Abdominal: Soft. He exhibits no distension. There is no tenderness. There is no rebound.  Musculoskeletal: He exhibits no edema.  Neurological: He is alert and oriented to person, place, and time.  Skin: Skin is warm and dry.  Psychiatric: He has a normal mood and affect. His behavior is normal. Thought content normal.          Assessment & Plan:  Fatigue-  Obtain TSH, repeat bmet due  to hypochloremia.

## 2014-12-01 NOTE — Assessment & Plan Note (Signed)
I wonder if OSA is playing a role in his fatigue.  He was intolerant to CPAP an is on HS Oxygen. He has seen Dr. Annamaria Boots in the past, will arrange follow up with Dr. Annamaria Boots.

## 2014-12-05 ENCOUNTER — Ambulatory Visit (INDEPENDENT_AMBULATORY_CARE_PROVIDER_SITE_OTHER): Payer: Medicare Other | Admitting: *Deleted

## 2014-12-05 DIAGNOSIS — Z7901 Long term (current) use of anticoagulants: Secondary | ICD-10-CM

## 2014-12-05 DIAGNOSIS — I4891 Unspecified atrial fibrillation: Secondary | ICD-10-CM

## 2014-12-05 LAB — POCT INR: INR: 2.3

## 2014-12-12 ENCOUNTER — Other Ambulatory Visit: Payer: Self-pay | Admitting: Family

## 2014-12-14 ENCOUNTER — Ambulatory Visit: Payer: Medicare Other | Admitting: Physical Medicine & Rehabilitation

## 2015-01-01 ENCOUNTER — Ambulatory Visit (HOSPITAL_BASED_OUTPATIENT_CLINIC_OR_DEPARTMENT_OTHER): Payer: Medicare Other | Admitting: Physical Medicine & Rehabilitation

## 2015-01-01 ENCOUNTER — Encounter: Payer: Self-pay | Admitting: Physical Medicine & Rehabilitation

## 2015-01-01 ENCOUNTER — Encounter: Payer: Medicare Other | Attending: Physical Medicine & Rehabilitation

## 2015-01-01 VITALS — BP 137/104 | HR 51

## 2015-01-01 DIAGNOSIS — G8110 Spastic hemiplegia affecting unspecified side: Secondary | ICD-10-CM

## 2015-01-01 DIAGNOSIS — I69354 Hemiplegia and hemiparesis following cerebral infarction affecting left non-dominant side: Secondary | ICD-10-CM | POA: Diagnosis not present

## 2015-01-01 DIAGNOSIS — G8114 Spastic hemiplegia affecting left nondominant side: Secondary | ICD-10-CM

## 2015-01-01 MED ORDER — TRAMADOL HCL ER 200 MG PO TB24: ORAL_TABLET | ORAL | Status: DC

## 2015-01-01 MED ORDER — TRAMADOL HCL 50 MG PO TABS: 50.0000 mg | ORAL_TABLET | Freq: Four times a day (QID) | ORAL | Status: DC | PRN

## 2015-01-01 NOTE — Progress Notes (Signed)
   Subjective:    Patient ID: [name], male    DOB: 09/22/1945, 69 y.o.   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 Patient has been treated with Botox for spasticity with good result for about 2 months. Trialed on Dysport To try to boost duration of effect however did not have the same result  Now resuming Botox treatments  Review of Systems     Objective:   Physical Exam        Assessment & Plan:  Botox Injection for spasticity using needle EMG guidance and E stim  Dilution: 50 Units/ml  Indication: Severe spasticity which interferes with ADL,mobility and/or hygiene and is unresponsive to medication management and other conservative care  Informed consent was obtained after describing risks and benefits of the procedure with the patient. This includes bleeding, bruising, infection, excessive weakness, or medication side effects. A REMS form is on file and signed.  Needle: 27g 1" needle electrode  Number of units per muscle   Biceps100  FCR50  FCU0  FDS50  FDP0  Gastrosoleus0  Hamstrings 200  All injections were done after obtaining appropriate EMG activity and E stinm and after negative drawback for blood. The patient tolerated the procedure well. Post procedure instructions were given. A followup appointment was made.

## 2015-01-01 NOTE — Patient Instructions (Signed)

## 2015-01-02 ENCOUNTER — Ambulatory Visit (INDEPENDENT_AMBULATORY_CARE_PROVIDER_SITE_OTHER): Payer: Medicare Other

## 2015-01-02 DIAGNOSIS — I4891 Unspecified atrial fibrillation: Secondary | ICD-10-CM | POA: Diagnosis not present

## 2015-01-02 DIAGNOSIS — Z7901 Long term (current) use of anticoagulants: Secondary | ICD-10-CM | POA: Diagnosis not present

## 2015-01-02 LAB — POCT INR: INR: 2.1

## 2015-01-05 ENCOUNTER — Ambulatory Visit: Payer: Medicare Other | Admitting: Family

## 2015-01-12 ENCOUNTER — Other Ambulatory Visit: Payer: Self-pay | Admitting: Family

## 2015-01-15 ENCOUNTER — Other Ambulatory Visit (INDEPENDENT_AMBULATORY_CARE_PROVIDER_SITE_OTHER): Payer: Medicare Other

## 2015-01-15 ENCOUNTER — Encounter: Payer: Self-pay | Admitting: Neurology

## 2015-01-15 ENCOUNTER — Ambulatory Visit (INDEPENDENT_AMBULATORY_CARE_PROVIDER_SITE_OTHER): Payer: Medicare Other | Admitting: Neurology

## 2015-01-15 VITALS — BP 128/70 | HR 71 | Wt 258.0 lb

## 2015-01-15 DIAGNOSIS — I10 Essential (primary) hypertension: Secondary | ICD-10-CM

## 2015-01-15 DIAGNOSIS — E785 Hyperlipidemia, unspecified: Secondary | ICD-10-CM | POA: Diagnosis not present

## 2015-01-15 DIAGNOSIS — G8110 Spastic hemiplegia affecting unspecified side: Secondary | ICD-10-CM

## 2015-01-15 DIAGNOSIS — I2510 Atherosclerotic heart disease of native coronary artery without angina pectoris: Secondary | ICD-10-CM | POA: Diagnosis not present

## 2015-01-15 DIAGNOSIS — E1142 Type 2 diabetes mellitus with diabetic polyneuropathy: Secondary | ICD-10-CM

## 2015-01-15 LAB — LIPID PANEL
CHOL/HDL RATIO: 3
Cholesterol: 136 mg/dL (ref 0–200)
HDL: 49.6 mg/dL (ref >39.00)
LDL Cholesterol: 70 mg/dL (ref 0–99)
NONHDL: 86.61
TRIGLYCERIDES: 84 mg/dL (ref 0.0–149.0)
VLDL: 16.8 mg/dL (ref 0.0–40.0)

## 2015-01-15 NOTE — Progress Notes (Signed)
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  Labs reviewed.  History obtained by patient and PCP note.  UPDATE: He is taking warfarin for secondary stroke prevention.  Labs from July include Hgb A1c of 6.6% and LDL of 90.  Lipitor was increased from 40mg  to 80mg  daily.  He receives Botox for spastic hemiplegia by PM&R.  HISTORY: He had a right basal ganglia hemorrhagic infarct in 1996, presumably from hypertension, with residual left sided spastic hemiparesis and Dejerine-Roussy syndrome  .  He takes tramadol.  Pain is generally under control but still has tightness in the left leg.  He has numbness and tingling in the feet.  He generally feels fatigued.  He was diagnosed with atrial fibrillation in 2004 and subsequently underwent cardioversion.  He had a recurrence of atrial fibrillation in April 2013.  2D echo from 11/22/12 showed EF 55-60% and grade 2 diastolic dysfunction. Carotid doppler revealed no hemodynamically significant ICA stenosis.    PAST MEDICAL HISTORY: Past Medical History  Diagnosis Date  . GERD (gastroesophageal reflux disease)   . Gout   . Hyperlipidemia   . Hypertension   . CVA (cerebral infarction)     left hemiparesis  . Chronic pain     left sided-Kristeins  . CAD (coronary artery disease)     s/p CABG, s/p DES to LCX  January 2011  . OSA on CPAP   . Sleep apnea   . History of colonoscopy   . Stroke ([place])   . Dysrhythmia     hx of atrial fibrilation with cardioversion    MEDICATIONS: Current Outpatient Prescriptions on File Prior to Visit  Medication Sig Dispense Refill  . allopurinol  (ZYLOPRIM) 100 MG tablet TAKE 2 TABLETS BY MOUTH DAILY. D/C PREVIOUS SCRIPTS FOR THIS MEDICATION 180 tablet 1  . atorvastatin (LIPITOR) 80 MG tablet Take 1 tablet (80 mg total) by mouth daily. 90 tablet 0  . benazepril (LOTENSIN) 10 MG tablet TAKE 1 TABLET BY MOUTH DAILY D/C PREVIOUS SCRIPTS FOR THIS MEDICATION 90 tablet 1  . cephALEXin (KEFLEX) 500 MG capsule Take 1 capsule (500 mg total) by mouth 4 (four) times daily. 40 capsule 0  . diltiazem (CARDIZEM CD) 180 MG 24 hr capsule TAKE 1 CAPSULE (180 MG TOTAL) BY MOUTH DAILY.  D/C PREVIOUS SCRIPTS FOR THIS MEDICATION 90 capsule 1  . fluticasone (FLONASE) 50 MCG/ACT nasal spray Place 2 sprays into both nostrils daily. 9.9 g 1  . furosemide (LASIX) 40 MG tablet Take 1 tablet (40 mg total) by mouth every other day. 45 tablet 1  . levothyroxine (SYNTHROID, LEVOTHROID) 112 MCG tablet TAKE 1 TABLET (112 MCG TOTAL) BY MOUTH DAILY BEFORE BREAKFAST. 90 tablet 1  . metFORMIN (GLUCOPHAGE) 500 MG tablet TAKE 500 MG BY MOUTH DAILY WITH BREAKFAST.  D/C PREVIOUS SCRIPTS FOR THIS MEDICATION (Patient taking differently: Take 500 mg by mouth daily with breakfast. ) 90 tablet 1  . Multiple Vitamins-Minerals (MULTIVITAMIN GUMMIES MENS PO) Take 1 tablet by mouth daily.    . nitroGLYCERIN (NITROSTAT) 0.4 MG SL tablet Place 0.4 mg under the tongue every 5 (five) minutes as needed for chest pain. Up to 3 doses    .  pantoprazole (PROTONIX) 40 MG tablet Take 1 tablet (40 mg total) by mouth daily. 90 tablet 1  . silver sulfADIAZINE (SILVADENE) 1 % cream Apply topically 2 (two) times daily. X 5 days 50 g 0  . traMADol (ULTRAM) 50 MG tablet Take 1 tablet (50 mg total) by mouth every 6 (six) hours as needed. 180 tablet 1  . traMADol (ULTRAM-ER) 200 MG 24 hr tablet TAKE 1 TABLET EVERY DAY WITH AM DOSE OF TRAMADOL 50 MG 90 tablet 1  . vitamin B-12 1000 MCG tablet Take 1 tablet (1,000 mcg total) by mouth daily. 30 tablet 0  . warfarin (COUMADIN) 2.5 MG tablet TAKE AS DIRECTED BY  ANTICOAGULATION CLINIC 100 tablet 1  . warfarin (COUMADIN) 2.5 MG tablet Take 2.5 mg by mouth daily.     No current facility-administered medications on file prior to visit.    ALLERGIES: No Known Allergies  FAMILY HISTORY: Family History  Problem Relation Age of Onset  . Lung cancer Father     deceased  . Stroke Mother     deceased-MINISTROKES    SOCIAL HISTORY: Social History   Social History  . Marital Status: Married    Spouse Name: [name]  . Number of Children: N/A  . Years of Education: N/A   Occupational History  . retired Hydrographic surveyor   Social History Main Topics  . Smoking status: Former Research scientist (life sciences)  . Smokeless tobacco: Never Used  . Alcohol Use: No     Comment: 1 beer a month  . Drug Use: No  . Sexual Activity: No   Other Topics Concern  . Not on file   Social History Narrative   Pt lives with wife. Does have stairs, but patient doesn't use them. Pt has completed technical school    REVIEW OF SYSTEMS: Constitutional: No fevers, chills, or sweats, no generalized fatigue, change in appetite Eyes: No visual changes, double vision, eye pain Ear, nose and throat: No hearing loss, ear pain, nasal congestion, sore throat Cardiovascular: No chest pain, palpitations Respiratory:  No shortness of breath at rest or with exertion, wheezes GastrointestinaI: No nausea, vomiting, diarrhea, abdominal pain, fecal incontinence Genitourinary:  No dysuria, urinary retention or frequency Musculoskeletal:  No neck pain, back pain Integumentary: No rash, pruritus, skin lesions Neurological: as above Psychiatric: No depression, insomnia, anxiety Endocrine: No palpitations, fatigue, diaphoresis, mood swings, change in appetite, change in weight, increased thirst Hematologic/Lymphatic:  No anemia, purpura, petechiae. Allergic/Immunologic: no itchy/runny eyes, nasal congestion, recent allergic reactions, rashes  PHYSICAL EXAM: Filed Vitals:   01/15/15 1022    BP: 128/70  Pulse: 71   General: No acute distress.   Head:  Normocephalic/atraumatic Eyes:  Fundoscopic exam unremarkable without vessel changes, exudates, hemorrhages or papilledema. Neck: supple, no paraspinal tenderness, full range of motion Heart:  Regular rate and rhythm Lungs:  Clear to auscultation bilaterally Back: No paraspinal tenderness Neurological Exam: alert and oriented to person, place, and time. Attention span and concentration intact, recent and remote memory intact, fund of knowledge intact.  Speech fluent and not dysarthric, language intact.  Reduced left V2-V3 and mild right lower facial weakness.  Otherwise, CN II-XII intact. Fundoscopic exam unremarkable without vessel changes, exudates, hemorrhages or papilledema.  Increased tone on left; muscle strength 5-/5 left grip, otherwise 5/5 throughout.  Sensation to pinprick reduced in left upper and lower extremities and less in the right foot.  Decreased vibration in feet.  Deep tendon reflexes 2+ throughout, toes downgoing.  Mild dysmetria  on left with finger to nose testing.  Gait with left limp. Romberg negative.  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  Coumadin 2.  Continue Lipitor 80mg  daily.  LDL goal is less than 70.  Recheck fasting lipid panel 3.  Blood pressure and diabetes control 4.  Mediterranean diet 5.  Follow up in one year.  Metta Clines, DO  CC:  Debbrah Alar, NP

## 2015-01-15 NOTE — Patient Instructions (Signed)
Continue coumadin Continue Lipitor 80mg  daily.  Will recheck fasting lipid panel Blood pressure control Diabetes control Low sodium diabetic diet Follow up in one year

## 2015-01-16 ENCOUNTER — Telehealth: Payer: Self-pay

## 2015-01-16 NOTE — Telephone Encounter (Signed)
Patient was notified.

## 2015-01-16 NOTE — Telephone Encounter (Signed)
-----   Message from Pieter Partridge, DO sent at 01/15/2015  3:05 PM EDT ----- Cholesterol looks okay.  I wouldn't make any changes to the medication.

## 2015-01-17 ENCOUNTER — Other Ambulatory Visit: Payer: Self-pay | Admitting: Family

## 2015-01-17 NOTE — Telephone Encounter (Signed)
Refill sent per [facility] refill protocol/SLS  

## 2015-02-13 ENCOUNTER — Ambulatory Visit (INDEPENDENT_AMBULATORY_CARE_PROVIDER_SITE_OTHER): Payer: Medicare Other | Admitting: Pharmacist

## 2015-02-13 DIAGNOSIS — I4891 Unspecified atrial fibrillation: Secondary | ICD-10-CM | POA: Diagnosis not present

## 2015-02-13 DIAGNOSIS — Z7901 Long term (current) use of anticoagulants: Secondary | ICD-10-CM | POA: Diagnosis not present

## 2015-02-13 LAB — POCT INR: INR: 3.1

## 2015-02-20 ENCOUNTER — Other Ambulatory Visit: Payer: Self-pay | Admitting: Family

## 2015-03-05 ENCOUNTER — Ambulatory Visit (INDEPENDENT_AMBULATORY_CARE_PROVIDER_SITE_OTHER): Payer: Medicare Other | Admitting: Family

## 2015-03-05 ENCOUNTER — Encounter: Payer: Self-pay | Admitting: Family

## 2015-03-05 VITALS — BP 130/76 | HR 68 | Temp 97.8°F | Resp 16 | Ht 75.0 in | Wt 259.4 lb

## 2015-03-05 DIAGNOSIS — I10 Essential (primary) hypertension: Secondary | ICD-10-CM | POA: Diagnosis not present

## 2015-03-05 DIAGNOSIS — E039 Hypothyroidism, unspecified: Secondary | ICD-10-CM

## 2015-03-05 DIAGNOSIS — I2510 Atherosclerotic heart disease of native coronary artery without angina pectoris: Secondary | ICD-10-CM | POA: Diagnosis not present

## 2015-03-05 DIAGNOSIS — E119 Type 2 diabetes mellitus without complications: Secondary | ICD-10-CM

## 2015-03-05 DIAGNOSIS — E785 Hyperlipidemia, unspecified: Secondary | ICD-10-CM | POA: Diagnosis not present

## 2015-03-05 DIAGNOSIS — G4733 Obstructive sleep apnea (adult) (pediatric): Secondary | ICD-10-CM

## 2015-03-05 LAB — BASIC METABOLIC PANEL
BUN: 18 mg/dL (ref 6–23)
CALCIUM: 9.7 mg/dL (ref 8.4–10.5)
CO2: 32 mEq/L (ref 19–32)
CREATININE: 1.08 mg/dL (ref 0.40–1.50)
Chloride: 101 mEq/L (ref 96–112)
GFR: 71.47 mL/min (ref >60.00)
GLUCOSE: 112 mg/dL — AB (ref 70–99)
Potassium: 4.2 mEq/L (ref 3.5–5.1)
Sodium: 139 mEq/L (ref 135–145)

## 2015-03-05 LAB — HEMOGLOBIN A1C: HEMOGLOBIN A1C: 6.3 % (ref 4.6–6.5)

## 2015-03-05 MED ORDER — ATORVASTATIN CALCIUM 40 MG PO TABS: 80.0000 mg | ORAL_TABLET | Freq: Every day | ORAL | Status: DC

## 2015-03-05 MED ORDER — PANTOPRAZOLE SODIUM 40 MG PO TBEC: 40.0000 mg | DELAYED_RELEASE_TABLET | Freq: Every day | ORAL | Status: DC

## 2015-03-05 MED ORDER — NITROGLYCERIN 0.4 MG SL SUBL: 0.4000 mg | SUBLINGUAL_TABLET | SUBLINGUAL | Status: DC | PRN

## 2015-03-05 NOTE — Assessment & Plan Note (Signed)
Stable, continue synthroid

## 2015-03-05 NOTE — Patient Instructions (Addendum)
Please complete lab work prior to leaving.   

## 2015-03-05 NOTE — Progress Notes (Signed)
Pre visit review using our clinic review tool, if applicable. No additional management support is needed unless otherwise documented below in the visit note. 

## 2015-03-05 NOTE — Assessment & Plan Note (Signed)
Maintained on statin, LDL at goal. Continue same.

## 2015-03-05 NOTE — Assessment & Plan Note (Signed)
BP stable on current meds. Continue same.  

## 2015-03-05 NOTE — Progress Notes (Signed)
Subjective:    Patient ID: [name], male    DOB: 02-Feb-1946, 69 y.o.   MRN: [id number]  HPI  [name] is a 69 yr old male who presents today for follow up.  1) DM2- currently maintained on metformin.  Lab Results  Component Value Date   HGBA1C 6.5 09/26/2014   HGBA1C 6.6* 06/23/2014   HGBA1C 7.0* 03/24/2014   Lab Results  Component Value Date   MICROALBUR <0.7 09/26/2014   LDLCALC 70 01/15/2015   CREATININE 0.98 12/01/2014   2)  HTN- maintained on benazepril, lasix.   BP Readings from Last 3 Encounters:  03/05/15 130/76  01/15/15 128/70  01/01/15 137/104   3) Hyperlipidemia- maintained on lipitor.  Lab Results  Component Value Date   CHOL 136 01/15/2015   HDL 49.60 01/15/2015   LDLCALC 70 01/15/2015   TRIG 84.0 01/15/2015   CHOLHDL 3 01/15/2015   4) Hypothyroid- Pt is maintained on synthroid.  Reports feeling well on current dose.  Lab Results  Component Value Date   TSH 0.81 12/01/2014   5) OSA- pt is currently on oxygen HS.  Not using CPAP. Has apt with Dr. Annamaria Boots 1/12.     Review of Systems    see HPI  Past Medical History  Diagnosis Date  . GERD (gastroesophageal reflux disease)   . Gout   . Hyperlipidemia   . Hypertension   . CVA (cerebral infarction)     left hemiparesis  . Chronic pain     left sided-Kristeins  . CAD (coronary artery disease)     s/p CABG, s/p DES to LCX  January 2011  . OSA on CPAP   . Sleep apnea   . History of colonoscopy   . Stroke ([place])   . Dysrhythmia     hx of atrial fibrilation with cardioversion    Social History   Social History  . Marital Status: Married    Spouse Name: [name]  . Number of Children: N/A  . Years of Education: N/A   Occupational History  . retired Hydrographic surveyor   Social History Main Topics  . Smoking status: Former Research scientist (life sciences)  . Smokeless tobacco: Never Used  . Alcohol Use: No     Comment: 1 beer a month  . Drug Use: No  . Sexual Activity: No   Other Topics  Concern  . Not on file   Social History Narrative   Pt lives with wife. Does have stairs, but patient doesn't use them. Pt has completed technical school    Past Surgical History  Procedure Laterality Date  . Coronary artery bypass graft      stent  . Esophagogastroduodenoscopy  03-25-2005  . Nasal septum surgery    . Percutaneous placement intravascular stent cervical carotid artery      03-2009; using a drug-eluting platform of the circumflex cornoray artery with a 3.0 x 18 [employer] Promus drug-eluting platform post dilated to 3.75 with a noncompliant balloon.  . Cataract extraction  05/2010    left eye  . Cataract extraction  04/2010    right eye  . Cardioversion  06/20/2011    Procedure: CARDIOVERSION;  Surgeon: Larey Dresser, MD;  Location[facility] ENDOSCOPY;  Service: Cardiovascular;  Laterality: N/A;  . Tee without cardioversion  06/20/2011    Procedure: TRANSESOPHAGEAL ECHOCARDIOGRAM (TEE);  Surgeon: Larey Dresser, MD;  Location: [facility] ENDOSCOPY;  Service: Cardiovascular;  Laterality: N/A;    Family History  Problem  Relation Age of Onset  . Lung cancer Father     deceased  . Stroke Mother     deceased-MINISTROKES    No Known Allergies  Current Outpatient Prescriptions on File Prior to Visit  Medication Sig Dispense Refill  . allopurinol (ZYLOPRIM) 100 MG tablet TAKE 2 TABLETS BY MOUTH DAILY. D/C PREVIOUS SCRIPTS FOR THIS MEDICATION 180 tablet 1  . atorvastatin (LIPITOR) 80 MG tablet TAKE 1 TABLET (80 MG TOTAL) BY MOUTH DAILY. 90 tablet 1  . benazepril (LOTENSIN) 10 MG tablet TAKE 1 TABLET BY MOUTH DAILY D/C PREVIOUS SCRIPTS FOR THIS MEDICATION 90 tablet 1  . diltiazem (CARDIZEM CD) 180 MG 24 hr capsule TAKE 1 CAPSULE EVERY DAY 90 capsule 1  . fluticasone (FLONASE) 50 MCG/ACT nasal spray Place 2 sprays into both nostrils daily. 9.9 g 1  . furosemide (LASIX) 40 MG tablet TAKE 1 TABLET BY MOUTH EVERY OTHER DAY 45 tablet 1  . levothyroxine (SYNTHROID, LEVOTHROID) 112 MCG  tablet TAKE 1 TABLET (112 MCG TOTAL) BY MOUTH DAILY BEFORE BREAKFAST. 90 tablet 1  . metFORMIN (GLUCOPHAGE) 500 MG tablet TAKE 1 TABLET EVERY DAY WITH BREAKFAST 90 tablet 0  . Multiple Vitamins-Minerals (MULTIVITAMIN GUMMIES MENS PO) Take 1 tablet by mouth daily.    . nitroGLYCERIN (NITROSTAT) 0.4 MG SL tablet Place 0.4 mg under the tongue every 5 (five) minutes as needed for chest pain. Up to 3 doses    . pantoprazole (PROTONIX) 40 MG tablet Take 1 tablet (40 mg total) by mouth daily. 90 tablet 1  . silver sulfADIAZINE (SILVADENE) 1 % cream Apply topically 2 (two) times daily. X 5 days 50 g 0  . traMADol (ULTRAM) 50 MG tablet Take 1 tablet (50 mg total) by mouth every 6 (six) hours as needed. 180 tablet 1  . traMADol (ULTRAM-ER) 200 MG 24 hr tablet TAKE 1 TABLET EVERY DAY WITH AM DOSE OF TRAMADOL 50 MG 90 tablet 1  . vitamin B-12 1000 MCG tablet Take 1 tablet (1,000 mcg total) by mouth daily. 30 tablet 0  . warfarin (COUMADIN) 2.5 MG tablet Take 2.5 mg by mouth daily.     No current facility-administered medications on file prior to visit.    BP 130/76 mmHg  Pulse 68  Temp(Src) 97.8 F (36.6 C) (Oral)  Resp 16  Ht 6\' 3"  (1.905 m)  Wt 259 lb 6.4 oz (117.663 kg)  BMI 32.42 kg/m2  SpO2 96%    Objective:   Physical Exam  Constitutional: He is oriented to person, place, and time. He appears well-developed and well-nourished. No distress.  HENT:  Head: Normocephalic and atraumatic.  Cardiovascular: Normal rate and regular rhythm.   No murmur heard. Pulmonary/Chest: Effort normal and breath sounds normal. No respiratory distress. He has no wheezes. He has no rales.  Musculoskeletal: He exhibits no edema.  Neurological: He is alert and oriented to person, place, and time.  Skin: Skin is warm and dry.  Psychiatric: He has a normal mood and affect. His behavior is normal. Thought content normal.          Assessment & Plan:

## 2015-03-05 NOTE — Assessment & Plan Note (Signed)
Stable on current meds. Continue same, obtain A1C.

## 2015-03-05 NOTE — Assessment & Plan Note (Signed)
On HS oxygen.  Has upcoming apt with Dr. Annamaria Boots, sleep specialist.

## 2015-03-23 ENCOUNTER — Ambulatory Visit (INDEPENDENT_AMBULATORY_CARE_PROVIDER_SITE_OTHER): Payer: Medicare Other | Admitting: Pharmacist

## 2015-03-23 DIAGNOSIS — I4891 Unspecified atrial fibrillation: Secondary | ICD-10-CM

## 2015-03-23 DIAGNOSIS — Z7901 Long term (current) use of anticoagulants: Secondary | ICD-10-CM | POA: Diagnosis not present

## 2015-03-23 LAB — POCT INR: INR: 3.2

## 2015-03-29 ENCOUNTER — Institutional Professional Consult (permissible substitution): Payer: Medicare Other | Admitting: Internal Medicine

## 2015-04-03 ENCOUNTER — Encounter: Payer: Self-pay | Admitting: Physical Medicine & Rehabilitation

## 2015-04-03 ENCOUNTER — Ambulatory Visit (HOSPITAL_BASED_OUTPATIENT_CLINIC_OR_DEPARTMENT_OTHER): Payer: Medicare Other | Admitting: Physical Medicine & Rehabilitation

## 2015-04-03 ENCOUNTER — Encounter: Payer: Medicare Other | Attending: Physical Medicine & Rehabilitation

## 2015-04-03 VITALS — BP 126/84 | HR 74 | Resp 16

## 2015-04-03 DIAGNOSIS — G8110 Spastic hemiplegia affecting unspecified side: Secondary | ICD-10-CM

## 2015-04-03 DIAGNOSIS — I69354 Hemiplegia and hemiparesis following cerebral infarction affecting left non-dominant side: Secondary | ICD-10-CM | POA: Insufficient documentation

## 2015-04-03 DIAGNOSIS — G8112 Spastic hemiplegia affecting left dominant side: Secondary | ICD-10-CM

## 2015-04-03 NOTE — Patient Instructions (Signed)
You received a Botox injection today. You may experience soreness at the needle injection sites. Please call us if any of the injection sites turns red after a couple days or if there is any drainage. You may experience muscle weakness as a result of Botox. This would improve with time but can take several weeks to improve. The Botox should start working in about one week. The Botox usually last 3 months. The injection can be repeated every 3 months as needed.  We increased the amount to finger flexors by 25 units and reduced the biceps by 25 units

## 2015-04-03 NOTE — Progress Notes (Signed)
Botox Injection for spasticity using needle EMG guidance and E stim  Dilution: 50 Units/ml  Indication: Severe spasticity which interferes with ADL,mobility and/or hygiene and is unresponsive to medication management and other conservative care  Informed consent was obtained after describing risks and benefits of the procedure with the patient. This includes bleeding, bruising, infection, excessive weakness, or medication side effects. A REMS form is on file and signed.  Needle: 27g 1" needle electrode  Number of units per muscle   Biceps75 FCR50  FCU0  FDS50  FDP25 Gastrosoleus0  Hamstrings 200  All injections were done after obtaining appropriate EMG activity and E stim and after negative drawback for blood. The patient tolerated the procedure well. Post procedure instructions were given. A followup appointment was made.   Would recommend the following dosing next visit Biceps 75 FDS 25 FDP 50 FCR 50 Hamstrings 200

## 2015-04-05 ENCOUNTER — Ambulatory Visit: Payer: Medicare Other | Admitting: Physical Medicine & Rehabilitation

## 2015-04-20 ENCOUNTER — Ambulatory Visit (INDEPENDENT_AMBULATORY_CARE_PROVIDER_SITE_OTHER): Payer: Medicare Other | Admitting: *Deleted

## 2015-04-20 DIAGNOSIS — Z7901 Long term (current) use of anticoagulants: Secondary | ICD-10-CM | POA: Diagnosis not present

## 2015-04-20 DIAGNOSIS — I4891 Unspecified atrial fibrillation: Secondary | ICD-10-CM | POA: Diagnosis not present

## 2015-04-20 LAB — POCT INR: INR: 2.7

## 2015-04-24 ENCOUNTER — Encounter (HOSPITAL_COMMUNITY): Payer: Self-pay | Admitting: Emergency Medicine

## 2015-04-24 ENCOUNTER — Emergency Department (HOSPITAL_COMMUNITY): Payer: Medicare Other

## 2015-04-24 ENCOUNTER — Observation Stay (HOSPITAL_COMMUNITY)
Admission: EM | Admit: 2015-04-24 | Discharge: 2015-04-27 | Disposition: A | Discharge status: Home / Self Care | Payer: Medicare Other | Attending: Internal Medicine | Admitting: Internal Medicine

## 2015-04-24 DIAGNOSIS — Z7984 Long term (current) use of oral hypoglycemic drugs: Secondary | ICD-10-CM | POA: Insufficient documentation

## 2015-04-24 DIAGNOSIS — Z951 Presence of aortocoronary bypass graft: Secondary | ICD-10-CM | POA: Diagnosis not present

## 2015-04-24 DIAGNOSIS — Z87891 Personal history of nicotine dependence: Secondary | ICD-10-CM | POA: Diagnosis not present

## 2015-04-24 DIAGNOSIS — M109 Gout, unspecified: Secondary | ICD-10-CM | POA: Diagnosis not present

## 2015-04-24 DIAGNOSIS — N189 Chronic kidney disease, unspecified: Secondary | ICD-10-CM | POA: Insufficient documentation

## 2015-04-24 DIAGNOSIS — R0789 Other chest pain: Secondary | ICD-10-CM | POA: Diagnosis not present

## 2015-04-24 DIAGNOSIS — Z6831 Body mass index (BMI) 31.0-31.9, adult: Secondary | ICD-10-CM | POA: Insufficient documentation

## 2015-04-24 DIAGNOSIS — I4891 Unspecified atrial fibrillation: Secondary | ICD-10-CM

## 2015-04-24 DIAGNOSIS — I10 Essential (primary) hypertension: Secondary | ICD-10-CM | POA: Diagnosis present

## 2015-04-24 DIAGNOSIS — G4733 Obstructive sleep apnea (adult) (pediatric): Secondary | ICD-10-CM | POA: Diagnosis not present

## 2015-04-24 DIAGNOSIS — Z9861 Coronary angioplasty status: Secondary | ICD-10-CM

## 2015-04-24 DIAGNOSIS — Z955 Presence of coronary angioplasty implant and graft: Secondary | ICD-10-CM | POA: Diagnosis not present

## 2015-04-24 DIAGNOSIS — G8110 Spastic hemiplegia affecting unspecified side: Secondary | ICD-10-CM | POA: Diagnosis present

## 2015-04-24 DIAGNOSIS — I130 Hypertensive heart and chronic kidney disease with heart failure and stage 1 through stage 4 chronic kidney disease, or unspecified chronic kidney disease: Secondary | ICD-10-CM | POA: Diagnosis not present

## 2015-04-24 DIAGNOSIS — E039 Hypothyroidism, unspecified: Secondary | ICD-10-CM | POA: Diagnosis not present

## 2015-04-24 DIAGNOSIS — Z7901 Long term (current) use of anticoagulants: Secondary | ICD-10-CM

## 2015-04-24 DIAGNOSIS — R079 Chest pain, unspecified: Secondary | ICD-10-CM | POA: Diagnosis not present

## 2015-04-24 DIAGNOSIS — I5032 Chronic diastolic (congestive) heart failure: Secondary | ICD-10-CM | POA: Diagnosis present

## 2015-04-24 DIAGNOSIS — E538 Deficiency of other specified B group vitamins: Secondary | ICD-10-CM | POA: Diagnosis not present

## 2015-04-24 DIAGNOSIS — E785 Hyperlipidemia, unspecified: Secondary | ICD-10-CM | POA: Diagnosis not present

## 2015-04-24 DIAGNOSIS — E119 Type 2 diabetes mellitus without complications: Secondary | ICD-10-CM

## 2015-04-24 DIAGNOSIS — Z5181 Encounter for therapeutic drug level monitoring: Secondary | ICD-10-CM

## 2015-04-24 DIAGNOSIS — K5900 Constipation, unspecified: Secondary | ICD-10-CM | POA: Diagnosis present

## 2015-04-24 DIAGNOSIS — E669 Obesity, unspecified: Secondary | ICD-10-CM | POA: Insufficient documentation

## 2015-04-24 DIAGNOSIS — I4811 Longstanding persistent atrial fibrillation: Secondary | ICD-10-CM | POA: Diagnosis present

## 2015-04-24 DIAGNOSIS — R531 Weakness: Secondary | ICD-10-CM | POA: Insufficient documentation

## 2015-04-24 DIAGNOSIS — R0602 Shortness of breath: Secondary | ICD-10-CM | POA: Diagnosis not present

## 2015-04-24 DIAGNOSIS — Z7951 Long term (current) use of inhaled steroids: Secondary | ICD-10-CM | POA: Diagnosis not present

## 2015-04-24 DIAGNOSIS — G8929 Other chronic pain: Secondary | ICD-10-CM | POA: Insufficient documentation

## 2015-04-24 DIAGNOSIS — K219 Gastro-esophageal reflux disease without esophagitis: Secondary | ICD-10-CM | POA: Insufficient documentation

## 2015-04-24 DIAGNOSIS — I69354 Hemiplegia and hemiparesis following cerebral infarction affecting left non-dominant side: Secondary | ICD-10-CM | POA: Diagnosis not present

## 2015-04-24 DIAGNOSIS — I480 Paroxysmal atrial fibrillation: Secondary | ICD-10-CM | POA: Insufficient documentation

## 2015-04-24 DIAGNOSIS — I2510 Atherosclerotic heart disease of native coronary artery without angina pectoris: Secondary | ICD-10-CM

## 2015-04-24 DIAGNOSIS — I4819 Other persistent atrial fibrillation: Secondary | ICD-10-CM | POA: Diagnosis present

## 2015-04-24 DIAGNOSIS — I6930 Unspecified sequelae of cerebral infarction: Secondary | ICD-10-CM

## 2015-04-24 HISTORY — DX: Chronic diastolic (congestive) heart failure: I50.32

## 2015-04-24 LAB — CBC
HEMATOCRIT: 41.2 % (ref 39.0–52.0)
Hemoglobin: 13.2 g/dL (ref 13.0–17.0)
MCH: 28.6 pg (ref 26.0–34.0)
MCHC: 32 g/dL (ref 30.0–36.0)
MCV: 89.2 fL (ref 78.0–100.0)
PLATELETS: 146 10*3/uL — AB (ref 150–400)
RBC: 4.62 MIL/uL (ref 4.22–5.81)
RDW: 13.9 % (ref 11.5–15.5)
WBC: 7.2 10*3/uL (ref 4.0–10.5)

## 2015-04-24 LAB — BASIC METABOLIC PANEL
ANION GAP: 10 (ref 5–15)
BUN: 18 mg/dL (ref 6–20)
CALCIUM: 9.4 mg/dL (ref 8.9–10.3)
CO2: 28 mmol/L (ref 22–32)
Chloride: 102 mmol/L (ref 101–111)
Creatinine, Ser: 1.25 mg/dL — ABNORMAL HIGH (ref 0.61–1.24)
GFR, EST NON AFRICAN AMERICAN: 56 mL/min — AB (ref >60)
Glucose, Bld: 113 mg/dL — ABNORMAL HIGH (ref 65–99)
POTASSIUM: 4.6 mmol/L (ref 3.5–5.1)
Sodium: 140 mmol/L (ref 135–145)

## 2015-04-24 LAB — TROPONIN I: Troponin I: <0.03 ng/mL (ref <0.031)

## 2015-04-24 LAB — PROTIME-INR
INR: 2.3 — AB (ref 0.00–1.49)
PROTHROMBIN TIME: 25.1 s — AB (ref 11.6–15.2)

## 2015-04-24 MED ORDER — FLUTICASONE PROPIONATE 50 MCG/ACT NA SUSP
2.0000 | Freq: Every day | NASAL | Status: DC
  Administered 2015-04-25 – 2015-04-27 (×3): 2 via NASAL
  Filled 2015-04-24: qty 16

## 2015-04-24 MED ORDER — ATORVASTATIN CALCIUM 80 MG PO TABS
80.0000 mg | ORAL_TABLET | Freq: Every day | ORAL | Status: DC
  Administered 2015-04-25 – 2015-04-26 (×3): 80 mg via ORAL
  Filled 2015-04-24 (×3): qty 1

## 2015-04-24 MED ORDER — DILTIAZEM LOAD VIA INFUSION
20.0000 mg | Freq: Once | INTRAVENOUS | Status: AC
  Administered 2015-04-24: 20 mg via INTRAVENOUS
  Filled 2015-04-24: qty 20

## 2015-04-24 MED ORDER — WARFARIN - PHARMACIST DOSING INPATIENT: Freq: Every day | Status: DC

## 2015-04-24 MED ORDER — SENNOSIDES-DOCUSATE SODIUM 8.6-50 MG PO TABS
1.0000 | ORAL_TABLET | Freq: Every evening | ORAL | Status: DC | PRN
  Filled 2015-04-24: qty 1

## 2015-04-24 MED ORDER — SODIUM CHLORIDE 0.9 % IV BOLUS (SEPSIS)
1000.0000 mL | Freq: Once | INTRAVENOUS | Status: AC
  Administered 2015-04-24: 1000 mL via INTRAVENOUS

## 2015-04-24 MED ORDER — ACETAMINOPHEN 650 MG RE SUPP: 650.0000 mg | Freq: Four times a day (QID) | RECTAL | Status: DC | PRN

## 2015-04-24 MED ORDER — ACETAMINOPHEN 325 MG PO TABS: 650.0000 mg | ORAL_TABLET | Freq: Four times a day (QID) | ORAL | Status: DC | PRN

## 2015-04-24 MED ORDER — TRAMADOL HCL 50 MG PO TABS: 50.0000 mg | ORAL_TABLET | Freq: Four times a day (QID) | ORAL | Status: DC | PRN

## 2015-04-24 MED ORDER — DILTIAZEM HCL 100 MG IV SOLR
5.0000 mg/h | INTRAVENOUS | Status: DC
  Administered 2015-04-24 – 2015-04-25 (×2): 5 mg/h via INTRAVENOUS
  Filled 2015-04-24 (×2): qty 100

## 2015-04-24 MED ORDER — SENNOSIDES-DOCUSATE SODIUM 8.6-50 MG PO TABS
1.0000 | ORAL_TABLET | Freq: Every day | ORAL | Status: DC
Start: 2015-04-25 — End: 2015-04-25

## 2015-04-24 MED ORDER — ALLOPURINOL 100 MG PO TABS
200.0000 mg | ORAL_TABLET | Freq: Every day | ORAL | Status: DC
  Administered 2015-04-25 – 2015-04-27 (×3): 200 mg via ORAL
  Filled 2015-04-24 (×3): qty 2

## 2015-04-24 MED ORDER — PANTOPRAZOLE SODIUM 40 MG PO TBEC
40.0000 mg | DELAYED_RELEASE_TABLET | Freq: Every day | ORAL | Status: DC
  Administered 2015-04-25 – 2015-04-27 (×3): 40 mg via ORAL
  Filled 2015-04-24 (×3): qty 1

## 2015-04-24 MED ORDER — LEVOTHYROXINE SODIUM 112 MCG PO TABS
112.0000 ug | ORAL_TABLET | Freq: Every day | ORAL | Status: DC
  Administered 2015-04-25 – 2015-04-27 (×3): 112 ug via ORAL
  Filled 2015-04-24 (×3): qty 1

## 2015-04-24 MED ORDER — DILTIAZEM HCL ER COATED BEADS 180 MG PO CP24: 180.0000 mg | ORAL_CAPSULE | Freq: Every day | ORAL | Status: DC

## 2015-04-24 MED ORDER — WARFARIN SODIUM 2.5 MG PO TABS
2.5000 mg | ORAL_TABLET | Freq: Every day | ORAL | Status: DC
  Administered 2015-04-25 – 2015-04-26 (×2): 2.5 mg via ORAL
  Filled 2015-04-24 (×2): qty 1

## 2015-04-24 MED ORDER — TRAMADOL HCL ER 200 MG PO TB24: 200.0000 mg | ORAL_TABLET | Freq: Every day | ORAL | Status: DC

## 2015-04-24 NOTE — ED Provider Notes (Addendum)
CSN: [id number]     Arrival date & time 04/24/15  1840 History   First MD Initiated Contact with Patient 04/24/15 1859     Chief Complaint  Patient presents with  . Chest Pain     (Consider location/radiation/quality/duration/timing/severity/associated sxs/prior Treatment) Patient is a 70 y.o. male presenting with chest pain. The history is provided by the patient.  Chest Pain Associated symptoms: no abdominal pain, no back pain, no cough, no fever, no headache, no shortness of breath and not vomiting   Patient w hx afib on coumadin, prior cva w residual left side numbness and mild weakness, prior cabg, c/o onset left chest/arm pain this am around 10 am. Was moving around some garbage cans at the time. Denies specific injury or strain.  Pain has persistent, constant, moderate. Pt indicates seems to improve, then recurs. Denies associated nv, diaphoresis or sob.  No pleuritic pain.  No fever or chills. Pt denies cough or uri c/o. No other recent exertional chest pain prior to today. No unusual fatigue or doe. Pt denies leg pain or swelling.        Past Medical History  Diagnosis Date  . GERD (gastroesophageal reflux disease)   . Gout   . Hyperlipidemia   . Hypertension   . CVA (cerebral infarction)     left hemiparesis  . Chronic pain     left sided-Kristeins  . CAD (coronary artery disease)     s/p CABG, s/p DES to LCX  January 2011  . OSA on CPAP   . Sleep apnea   . History of colonoscopy   . Stroke ([place])   . Dysrhythmia     hx of atrial fibrilation with cardioversion   Past Surgical History  Procedure Laterality Date  . Coronary artery bypass graft      stent  . Esophagogastroduodenoscopy  03-25-2005  . Nasal septum surgery    . Percutaneous placement intravascular stent cervical carotid artery      03-2009; using a drug-eluting platform of the circumflex cornoray artery with a 3.0 x 18 [employer] Promus drug-eluting platform post dilated to 3.75 with a  noncompliant balloon.  . Cataract extraction  05/2010    left eye  . Cataract extraction  04/2010    right eye  . Cardioversion  06/20/2011    Procedure: CARDIOVERSION;  Surgeon: Larey Dresser, MD;  Location: [facility] ENDOSCOPY;  Service: Cardiovascular;  Laterality: N/A;  . Tee without cardioversion  06/20/2011    Procedure: TRANSESOPHAGEAL ECHOCARDIOGRAM (TEE);  Surgeon: Larey Dresser, MD;  Location: [facility] ENDOSCOPY;  Service: Cardiovascular;  Laterality: N/A;   Family History  Problem Relation Age of Onset  . Lung cancer Father     deceased  . Stroke Mother     deceased-MINISTROKES   Social History  Substance Use Topics  . Smoking status: Former Research scientist (life sciences)  . Smokeless tobacco: Never Used  . Alcohol Use: No     Comment: 1 beer a month    Review of Systems  Constitutional: Negative for fever and chills.  HENT: Negative for sore throat.   Eyes: Negative for redness.  Respiratory: Negative for cough and shortness of breath.   Cardiovascular: Positive for chest pain. Negative for leg swelling.  Gastrointestinal: Negative for vomiting, abdominal pain and diarrhea.  Genitourinary: Negative for dysuria and flank pain.  Musculoskeletal: Negative for back pain and neck pain.  Skin: Negative for rash.  Neurological: Negative for headaches.  Hematological: Does not bruise/bleed easily.  Psychiatric/Behavioral: Negative for  confusion.      Allergies  Review of patient's allergies indicates no known allergies.  Home Medications   Prior to Admission medications   Medication Sig Start Date End Date Taking? Authorizing Provider  allopurinol (ZYLOPRIM) 100 MG tablet TAKE 2 TABLETS BY MOUTH DAILY. D/C PREVIOUS SCRIPTS FOR THIS MEDICATION 01/12/15   Debbrah Alar, NP  atorvastatin (LIPITOR) 40 MG tablet Take 2 tablets (80 mg total) by mouth daily. 03/05/15   Debbrah Alar, NP  benazepril (LOTENSIN) 10 MG tablet TAKE 1 TABLET BY MOUTH DAILY D/C PREVIOUS SCRIPTS FOR THIS MEDICATION 10/23/14    Debbrah Alar, NP  diltiazem (CARDIZEM CD) 180 MG 24 hr capsule TAKE 1 CAPSULE EVERY DAY 02/20/15   Debbrah Alar, NP  fluticasone (FLONASE) 50 MCG/ACT nasal spray Place 2 sprays into both nostrils daily. 06/02/14   Brunetta Jeans, PA-C  furosemide (LASIX) 40 MG tablet TAKE 1 TABLET BY MOUTH EVERY OTHER DAY 02/20/15   Debbrah Alar, NP  levothyroxine (SYNTHROID, LEVOTHROID) 112 MCG tablet TAKE 1 TABLET (112 MCG TOTAL) BY MOUTH DAILY BEFORE BREAKFAST. 12/12/14   Debbrah Alar, NP  metFORMIN (GLUCOPHAGE) 500 MG tablet TAKE 1 TABLET EVERY DAY WITH BREAKFAST 01/17/15   Debbrah Alar, NP  Multiple Vitamins-Minerals (MULTIVITAMIN GUMMIES MENS PO) Take 1 tablet by mouth daily.    Historical Provider, MD  nitroGLYCERIN (NITROSTAT) 0.4 MG SL tablet Place 1 tablet (0.4 mg total) under the tongue every 5 (five) minutes as needed for chest pain. Up to 3 doses 03/05/15   Debbrah Alar, NP  pantoprazole (PROTONIX) 40 MG tablet Take 1 tablet (40 mg total) by mouth daily. 03/05/15   Debbrah Alar, NP  traMADol (ULTRAM) 50 MG tablet Take 1 tablet (50 mg total) by mouth every 6 (six) hours as needed. 01/01/15   Charlett Blake, MD  traMADol (ULTRAM-ER) 200 MG 24 hr tablet TAKE 1 TABLET EVERY DAY WITH AM DOSE OF TRAMADOL 50 MG 01/01/15   Charlett Blake, MD  vitamin B-12 1000 MCG tablet Take 1 tablet (1,000 mcg total) by mouth daily. 08/11/13   Orson Eva, MD  warfarin (COUMADIN) 2.5 MG tablet Take 2.5 mg by mouth daily.    Historical Provider, MD   BP 129/83 mmHg  Pulse 117  Temp(Src) 99.8 F (37.7 C) (Oral)  Resp 13  SpO2 96% Physical Exam  Constitutional: He is oriented to person, place, and time. He appears well-developed and well-nourished. No distress.  HENT:  Head: Atraumatic.  Mouth/Throat: Oropharynx is clear and moist.  Eyes: Conjunctivae are normal.  Neck: Neck supple. No tracheal deviation present.  Cardiovascular: Normal rate, normal heart sounds and intact  distal pulses.  Exam reveals no gallop and no friction rub.   No murmur heard. Pulmonary/Chest: Effort normal and breath sounds normal. No accessory muscle usage. No respiratory distress.  Abdominal: Soft. Bowel sounds are normal. He exhibits no distension. There is no tenderness.  Musculoskeletal: Normal range of motion. He exhibits no edema or tenderness.  Neurological: He is alert and oriented to person, place, and time.  No facial droop.  No pronator drift.  L weakness 4+/5 (pt denies any new left sided numbness or weakness). Speech clear/fluent.   Skin: Skin is warm and dry. No rash noted. He is not diaphoretic.  Psychiatric: He has a normal mood and affect.  Nursing note and vitals reviewed.   ED Course  Procedures (including critical care time) Labs Review   Results for orders placed or performed during the hospital encounter of  [id number]  Basic metabolic panel  Result Value Ref Range   Sodium 140 135 - 145 mmol/L   Potassium 4.6 3.5 - 5.1 mmol/L   Chloride 102 101 - 111 mmol/L   CO2 28 22 - 32 mmol/L   Glucose, Bld 113 (H) 65 - 99 mg/dL   BUN 18 6 - 20 mg/dL   Creatinine, Ser 1.25 (H) 0.61 - 1.24 mg/dL   Calcium 9.4 8.9 - 10.3 mg/dL   GFR calc non Af Amer 56 (L) >60 mL/min   GFR calc Af Amer >60 >60 mL/min   Anion gap 10 5 - 15  CBC  Result Value Ref Range   WBC 7.2 4.0 - 10.5 K/uL   RBC 4.62 4.22 - 5.81 MIL/uL   Hemoglobin 13.2 13.0 - 17.0 g/dL   HCT 41.2 39.0 - 52.0 %   MCV 89.2 78.0 - 100.0 fL   MCH 28.6 26.0 - 34.0 pg   MCHC 32.0 30.0 - 36.0 g/dL   RDW 13.9 11.5 - 15.5 %   Platelets 146 (L) 150 - 400 K/uL  Protime-INR - (order if Patient is taking Coumadin / Warfarin)  Result Value Ref Range   Prothrombin Time 25.1 (H) 11.6 - 15.2 seconds   INR 2.30 (H) 0.00 - 1.49  Troponin I  Result Value Ref Range   Troponin I <0.03 <0.031 ng/mL   Dg Chest 2 View  04/24/2015  CLINICAL DATA:  Epigastric pain and umbilical pain that began today. No shortness of breath.  EXAM: CHEST  2 VIEW COMPARISON:  11/25/2014. FINDINGS: Cardiomegaly. Prior CABG. Chronic RIGHT hemidiaphragm elevation. No active infiltrates or failure. No effusion or pneumothorax. Bones unremarkable. IMPRESSION: Cardiomegaly.  No active disease. Electronically Signed   By: Staci Righter M.D.   On: 04/24/2015 19:34         I have personally reviewed and evaluated these images and lab results as part of my medical decision-making.   EKG Interpretation   Date/Time:  Tuesday April 24 2015 18:42:00 EST Ventricular Rate:  110 PR Interval:    QRS Duration: 99 QT Interval:  330 QTC Calculation: 446 R Axis:   -36 Text Interpretation:  Atrial fibrillation Left axis deviation Left  ventricular hypertrophy No significant change since last tracing Confirmed  by Ayo Smoak  MD, Lennette Bihari ([id number]) on 04/24/2015 6:51:03 PM      MDM   Iv ns. Labs. Cxr. Ecg.  Reviewed nursing notes and prior charts for additional history.   Pts pain had improved, then recurred. Will repeat ecg.  Initial trop neg.   Pt unsure when he went back into a fib.  Prior ecg from 11/2014 in sinus.  Pt denies sensing palpitations or other acute change in rhythm recently.  While in ED, hr goes up to 130's, will give cardizem bolus/gtt.   Given chest pain to shoulder, afib w rapid vent response, will admit to med service.        [name], MD 04/24/15 [phone]

## 2015-04-24 NOTE — H&P (Signed)
Date: 04/25/2015               Patient Name:  [name] MRN: [id number]  DOB: 02-12-1946 Age / Sex: 70 y.o., male   PCP: Debbrah Alar, NP         Medical Service: Internal Medicine Teaching Service         Attending Physician: Dr. Thayer Headings, MD    First Contact: Dr. Juleen China Pager: [phone]  Second Contact: Dr. Posey Pronto Pager: [phone]       After Hours (After 5p/  First Contact Pager: [phone]  weekends / holidays): Second Contact Pager: [phone]   Chief Complaint: Chest pain  History of Present Illness: 70 y/o man with PMHx of CVA with residual L spastic hemiparesis, CAD s/p CABG 2004, PAF on coumadin, HTN, HLD, GERD presents to the ED after starting to feel chest pressure and fatigue this morning. He was feeling well and drove to the store and back to buy and install a circuit breaker with his son in law around 10:30am after which time he first noticed a heaviness in his left arm and leg. He does not feel this entailed particular physical exertion. He then felt very drained and unable to walk more than 25-53ft continuously due to this weakness. After this he developed chest tightness and came to the ED for concern about his symptoms. After arrival he was noted to be in Afib with RVR with HR up to 130s resting in bed and given bolus then infusion of cardizem. No EKG changes concerning for active ischemia, initial troponins negative x2. Symptoms improved but did not fully resolve and continued Afib with tachycardia and admitted for full evaluation.  He denies dyspnea, diaphoresis, nausea, headache during this time. He does remark on severe constipation at home with no bowel movement for 6-7 days until 2 days ago, then once daily with very large hard stools requiring intense straining to pass. He also reports an extremely high stress level for the past 2 weeks due to financial difficulties, repair of a septic tank, repair of home appliances. His only recent change in medications was  starting Osteo Bi-flex OTC. He was otherwise feeling in good health prior to these events.  Baseline function is ambulatory without aids at home. He receives botox injections and management of his L sided weakness and spasticity with PMR Dr. Read Drivers. Per daughter has short term memory deficits that do not impair ADLs. Last TTE in 0000000 grade 2 diastolic dysfunction with 60-65% LVEF, last stress study in 10/2011.  Meds: Current Facility-Administered Medications  Medication Dose Route Frequency Provider Last Rate Last Dose  . acetaminophen (TYLENOL) tablet 650 mg  650 mg Oral Q6H PRN Juluis Mire, MD       Or  . acetaminophen (TYLENOL) suppository 650 mg  650 mg Rectal Q6H PRN Marjan Rabbani, MD      . allopurinol (ZYLOPRIM) tablet 200 mg  200 mg Oral Daily Marjan Rabbani, MD      . atorvastatin (LIPITOR) tablet 80 mg  80 mg Oral Daily Marjan Rabbani, MD   80 mg at 04/25/15 0310  . diltiazem (CARDIZEM CD) 24 hr capsule 180 mg  180 mg Oral Daily Marjan Rabbani, MD      . diltiazem (CARDIZEM) 100 mg in dextrose 5 % 100 mL (1 mg/mL) infusion  5 mg/hr Intravenous Continuous [name], MD 5 mL/hr at 04/25/15 0309 5 mg/hr at 04/25/15 0309  . fluticasone (FLONASE) 50 MCG/ACT nasal spray 2 spray  2 spray Each Nare Daily Marjan Rabbani, MD      . levothyroxine (SYNTHROID, LEVOTHROID) tablet 112 mcg  112 mcg Oral QAC breakfast Marjan Rabbani, MD      . pantoprazole (PROTONIX) EC tablet 40 mg  40 mg Oral Daily Marjan Rabbani, MD      . polyethylene glycol (MIRALAX / GLYCOLAX) packet 17 g  17 g Oral Daily Marjan Rabbani, MD      . promethazine (PHENERGAN) injection 12.5 mg  12.5 mg Intravenous Q6H PRN Marjan Rabbani, MD      . senna-docusate (Senokot-S) tablet 1 tablet  1 tablet Oral BID Marjan Rabbani, MD      . traMADol (ULTRAM) tablet 50 mg  50 mg Oral Q6H PRN Marjan Rabbani, MD      . traMADol (ULTRAM) tablet 50 mg  50 mg Oral 4 times per day Marjan Rabbani, MD      . warfarin (COUMADIN) tablet 2.5  mg  2.5 mg Oral q1800 Franky Macho, RPH      . Warfarin - Pharmacist Dosing Inpatient   Does not apply q1800 Franky Macho, [facility]        Allergies: Allergies as of 04/24/2015  . (No Known Allergies)   Past Medical History  Diagnosis Date  . GERD (gastroesophageal reflux disease)   . Gout   . Hyperlipidemia   . Hypertension   . CVA (cerebral infarction)     left hemiparesis  . Chronic pain     left sided-Kristeins  . CAD (coronary artery disease)     s/p CABG, s/p DES to LCX  January 2011  . OSA on CPAP   . Sleep apnea   . History of colonoscopy   . Stroke ([place])   . Dysrhythmia     hx of atrial fibrilation with cardioversion  . Hypothyroidism   . Diastolic CHF [facility])    Past Surgical History  Procedure Laterality Date  . Coronary artery bypass graft      stent  . Esophagogastroduodenoscopy  03-25-2005  . Nasal septum surgery    . Percutaneous placement intravascular stent cervical carotid artery      03-2009; using a drug-eluting platform of the circumflex cornoray artery with a 3.0 x 18 [employer] Promus drug-eluting platform post dilated to 3.75 with a noncompliant balloon.  . Cataract extraction  05/2010    left eye  . Cataract extraction  04/2010    right eye  . Cardioversion  06/20/2011    Procedure: CARDIOVERSION;  Surgeon: Larey Dresser, MD;  Location: [facility] ENDOSCOPY;  Service: Cardiovascular;  Laterality: N/A;  . Tee without cardioversion  06/20/2011    Procedure: TRANSESOPHAGEAL ECHOCARDIOGRAM (TEE);  Surgeon: Larey Dresser, MD;  Location: [facility] ENDOSCOPY;  Service: Cardiovascular;  Laterality: N/A;   Family History  Problem Relation Age of Onset  . Lung cancer Father     deceased  . Stroke Mother     deceased-MINISTROKES   Social History   Social History  . Marital Status: Married    Spouse Name: [name]  . Number of Children: N/A  . Years of Education: N/A   Occupational History  . retired Hydrographic surveyor   Social History Main Topics  .  Smoking status: Former Research scientist (life sciences)  . Smokeless tobacco: Never Used  . Alcohol Use: No     Comment: 1 beer a month  . Drug Use: No  . Sexual Activity: No   Other Topics Concern  . Not  on file   Social History Narrative   Pt lives with wife. Does have stairs, but patient doesn't use them. Pt has completed technical school    Review of Systems: Review of Systems  Constitutional: Positive for malaise/fatigue.  Eyes: Negative for blurred vision.  Respiratory: Negative for cough and shortness of breath.   Cardiovascular: Positive for chest pain. Negative for leg swelling.  Gastrointestinal: Positive for abdominal pain and constipation.  Genitourinary: Negative for dysuria.  Musculoskeletal: Negative for falls.  Skin: Negative for rash.  Neurological: Positive for focal weakness. Negative for dizziness and headaches.  Endo/Heme/Allergies: Negative for environmental allergies.  Psychiatric/Behavioral: Positive for memory loss.    Physical Exam: Blood pressure 144/104, pulse 89, temperature 99.8 F (37.7 C), temperature source Oral, resp. rate 19, height 6\' 3"  (1.905 m), weight 116.801 kg (257 lb 8 oz), SpO2 94 %. GENERAL- alert, co-operative, NAD HEENT- Atraumatic, PERRL, EOMI, oral mucosa appears moist, no carotid bruit, beefy neck, no JVD appreciated CARDIAC- RRR, no murmurs, rubs or gallops. RESP- CTAB, no wheezes or crackles. ABDOMEN- Moderately distended, tender to palpation throughout, hyperactive bowel sounds throughout BACK- No paraspinal tenderness, no CVA tenderness. NEURO- No obvious Cr N abnormality, strength upper and lower extremities- 4+/5 on left side with impaired fine motor tasks but grossly intact sensation and strength EXTREMITIES- no pedal edema, TED hose on LLE below knee SKIN- Warm, dry, No rash or lesion. PSYCH- Normal mood and affect, appropriate thought content and speech.   Lab results: Basic Metabolic Panel:  Recent Labs  04/24/15 1911 04/25/15 0218    NA 140 142  K 4.6 4.2  CL 102 103  CO2 28 28  GLUCOSE 113* 107*  BUN 18 15  CREATININE 1.25* 1.06  CALCIUM 9.4 9.1  MG  --  1.9  PHOS  --  3.5   Liver Function Tests:  Recent Labs  04/25/15 0218  AST 27  ALT 35  ALKPHOS 52  BILITOT 1.3*  PROT 6.6  ALBUMIN 3.7   No results for input(s): LIPASE, AMYLASE in the last 72 hours. No results for input(s): AMMONIA in the last 72 hours. CBC:  Recent Labs  04/24/15 1911 04/25/15 0218  WBC 7.2 7.4  HGB 13.2 12.7*  HCT 41.2 39.2  MCV 89.2 88.3  PLT 146* 151   Cardiac Enzymes:  Recent Labs  04/24/15 2019 04/24/15 2210 04/25/15 0218  TROPONINI <0.03 <0.03 <0.03   BNP: No results for input(s): PROBNP in the last 72 hours. D-Dimer: No results for input(s): DDIMER in the last 72 hours. CBG: No results for input(s): GLUCAP in the last 72 hours. Hemoglobin A1C: No results for input(s): HGBA1C in the last 72 hours. Fasting Lipid Panel: No results for input(s): CHOL, HDL, LDLCALC, TRIG, CHOLHDL, LDLDIRECT in the last 72 hours. Thyroid Function Tests: No results for input(s): TSH, T4TOTAL, FREET4, T3FREE, THYROIDAB in the last 72 hours. Anemia Panel: No results for input(s): VITAMINB12, FOLATE, FERRITIN, TIBC, IRON, RETICCTPCT in the last 72 hours. Coagulation:  Recent Labs  04/24/15 1911 04/25/15 0218  LABPROT 25.1* 24.4*  INR 2.30* 2.22*   Urine Drug Screen: Drugs of Abuse     Component Value Date/Time   LABOPIA NONE DETECTED 08/10/2013 1210   COCAINSCRNUR NONE DETECTED 08/10/2013 1210   LABBENZ NONE DETECTED 08/10/2013 1210   AMPHETMU NONE DETECTED 08/10/2013 1210   THCU NONE DETECTED 08/10/2013 1210   LABBARB NONE DETECTED 08/10/2013 1210    Alcohol Level: No results for input(s): ETH in the last 72  hours. Urinalysis: No results for input(s): COLORURINE, LABSPEC, PHURINE, GLUCOSEU, HGBUR, BILIRUBINUR, KETONESUR, PROTEINUR, UROBILINOGEN, NITRITE, LEUKOCYTESUR in the last 72 hours.  Invalid input(s):  APPERANCEUR   Imaging results:  Dg Chest 2 View  04/24/2015  CLINICAL DATA:  Epigastric pain and umbilical pain that began today. No shortness of breath. EXAM: CHEST  2 VIEW COMPARISON:  11/25/2014. FINDINGS: Cardiomegaly. Prior CABG. Chronic RIGHT hemidiaphragm elevation. No active infiltrates or failure. No effusion or pneumothorax. Bones unremarkable. IMPRESSION: Cardiomegaly.  No active disease. Electronically Signed   By: Staci Righter M.D.   On: 04/24/2015 19:34    Other results: EKG: Atrial fibrillation with tachycardia, slight left axis deviation, no  Progressive T wave elevation or depression, mild isolated to lateral lead  Assessment & Plan by Problem: Principal Problem:   Chest pain Active Problems:   Hyperlipidemia   GOUT   Obstructive sleep apnea   Essential hypertension   CAD, NATIVE VESSEL   History of stroke with residual deficit   GERD   Allergic rhinitis   Atrial fibrillation ([place])   Long term (current) use of anticoagulants   Spastic hemiplegia affecting nondominant side (H[facility])   Hypothyroidism   B12 deficiency   Constipation   Diastolic CHF ([place]) Afib with RVR/Paroxismal Afib Hx Patient presenting to ED Afib w/ RVR rate up to 130s before diltiazem bolus+gtt. Not clear how long he is in Afib. 11/2014 sinus rhythm documented. Has been cardioverted in the past. Last TTE in 0000000 diastolic dysfunction with normal LVEF, last stress study in 10/2011. He may be experiencing worsened cardiac function 2/2 afib but does not appear to be ongoing ischemic event. -Diltiazem gtt continue to AM, 180mg  PO in AM if adequate rate control -Admit to stepdown unit -Continuous cardiac monitoring -Trend troponin x3 -Repeat AM EKG -Goal maintain HR <110 -Consult to cardiology for recs, consider TTE vs NM stress study, inpt vs outpt  Constipation History of up to 6-7 days w/o BM. Hard straining with passage of stool yesterday. Maybe causing vasovagal contribution to his symptoms.  Uses stool softener at home with minimal improvement. Abdomen distended and diffusely tender, with prominent bowel gas sounds. -Senakot-S BID -Miralax  L spastic hemiparesis s/p remote CVA Neuro exam seems stable compared to previously documented. Strength is good and sensation to light touch intact despite complaint of numb sensation. Recent botox injections by Dr. Letta Pate still controllin pain until this event. -Tramadol ER 200mg  -Tramadol 50mg  q6hrs PRN  CAD s/p CABG -Continue home plavix 75mg   HTN -Continue home lotensin 10mg  -On dilt as above  GERD Stable and has been well controlled on PPI. Current pain presentation not epigastric. -Protonix 40mg  PO  Hypothyroidism -Synthroid 161mcg -Will check TSH as possible compontent of recent anxiety, Afib  Gout Asymptomatic -Allopurinol 200mg   OSA Stable -CPAP qhs  CKD Baseline 1.1-1.2 from 2016 studies. 1.25 on presentation. Does not seem significantly dehydrated nor overloaded. -Hold lasix for now (40mg  QOD PTA)  Diet: NPO VTE ppx: Coumadin FULL CODE  Dispo: Disposition is deferred at this time, awaiting improvement of current medical problems. Anticipated discharge in approximately 1-2 day(s).   The patient does have a current PCP (Debbrah Alar, NP) and does need an [facility] hospital follow-up appointment after discharge.  The patient does not know have transportation limitations that hinder transportation to clinic appointments.  Signed: [name], MD 04/25/2015, 3:36 AM

## 2015-04-24 NOTE — Progress Notes (Signed)
ANTICOAGULATION CONSULT NOTE - Initial Consult  Pharmacy Consult for Warfarin Indication: atrial fibrillation  No Known Allergies  Patient Measurements:    Vital Signs: Temp: 99.8 F (37.7 C) (02/07 1849) Temp Source: Oral (02/07 1849) BP: 151/105 mmHg (02/07 2230) Pulse Rate: 70 (02/07 2230)  Labs:  Recent Labs  04/24/15 1911 04/24/15 2019 04/24/15 2210  HGB 13.2  --   --   HCT 41.2  --   --   PLT 146*  --   --   LABPROT 25.1*  --   --   INR 2.30*  --   --   CREATININE 1.25*  --   --   TROPONINI  --  <0.03 <0.03    CrCl cannot be calculated (Unknown ideal weight.).   Medical History: Past Medical History  Diagnosis Date  . GERD (gastroesophageal reflux disease)   . Gout   . Hyperlipidemia   . Hypertension   . CVA (cerebral infarction)     left hemiparesis  . Chronic pain     left sided-Kristeins  . CAD (coronary artery disease)     s/p CABG, s/p DES to LCX  January 2011  . OSA on CPAP   . Sleep apnea   . History of colonoscopy   . Stroke ([place])   . Dysrhythmia     hx of atrial fibrilation with cardioversion    Medications:  See electronic med rec  Assessment: 70 y.o. M presents with CP - noted to be in afib with RVR. Pt on coumadin PTA for afib. Home dose 2.5mg  daily - last dose taken 2/7 a.m. INR therapeutic on admission. CBC ok at baseline. To continue coumadin.  Goal of Therapy:  INR 2-3 Monitor platelets by anticoagulation protocol: Yes   Plan:  Coumadin 2.5mg  po daily Daily INR  Sherlon Handing, PharmD, BCPS Clinical pharmacist, pager [phone] 04/24/2015,11:41 PM

## 2015-04-24 NOTE — ED Notes (Signed)
Patient comes from from home Chest pain states pain in left side and radiates to the left arm and leg. Patient has had a pervious stroke and states the pain feels like a numbness. Patient in Afib Hx of with rate 105-127. Patient took 324 ASA at home and was given 1 nitro without any relief. Patient rates pain 8/10.

## 2015-04-24 NOTE — ED Notes (Signed)
Currently off unit

## 2015-04-25 ENCOUNTER — Encounter: Payer: Self-pay | Admitting: Neurology

## 2015-04-25 DIAGNOSIS — I2510 Atherosclerotic heart disease of native coronary artery without angina pectoris: Secondary | ICD-10-CM

## 2015-04-25 DIAGNOSIS — K5900 Constipation, unspecified: Secondary | ICD-10-CM

## 2015-04-25 DIAGNOSIS — N189 Chronic kidney disease, unspecified: Secondary | ICD-10-CM

## 2015-04-25 DIAGNOSIS — I129 Hypertensive chronic kidney disease with stage 1 through stage 4 chronic kidney disease, or unspecified chronic kidney disease: Secondary | ICD-10-CM

## 2015-04-25 DIAGNOSIS — R0789 Other chest pain: Secondary | ICD-10-CM | POA: Diagnosis not present

## 2015-04-25 DIAGNOSIS — I4891 Unspecified atrial fibrillation: Secondary | ICD-10-CM | POA: Diagnosis not present

## 2015-04-25 DIAGNOSIS — I4819 Other persistent atrial fibrillation: Secondary | ICD-10-CM | POA: Diagnosis present

## 2015-04-25 DIAGNOSIS — I480 Paroxysmal atrial fibrillation: Secondary | ICD-10-CM

## 2015-04-25 DIAGNOSIS — M109 Gout, unspecified: Secondary | ICD-10-CM | POA: Diagnosis not present

## 2015-04-25 DIAGNOSIS — Z7901 Long term (current) use of anticoagulants: Secondary | ICD-10-CM | POA: Diagnosis not present

## 2015-04-25 DIAGNOSIS — I69354 Hemiplegia and hemiparesis following cerebral infarction affecting left non-dominant side: Secondary | ICD-10-CM | POA: Diagnosis not present

## 2015-04-25 DIAGNOSIS — R55 Syncope and collapse: Secondary | ICD-10-CM | POA: Insufficient documentation

## 2015-04-25 DIAGNOSIS — Z9861 Coronary angioplasty status: Secondary | ICD-10-CM

## 2015-04-25 DIAGNOSIS — Z79899 Other long term (current) drug therapy: Secondary | ICD-10-CM

## 2015-04-25 DIAGNOSIS — Z951 Presence of aortocoronary bypass graft: Secondary | ICD-10-CM

## 2015-04-25 DIAGNOSIS — K219 Gastro-esophageal reflux disease without esophagitis: Secondary | ICD-10-CM

## 2015-04-25 DIAGNOSIS — E039 Hypothyroidism, unspecified: Secondary | ICD-10-CM

## 2015-04-25 DIAGNOSIS — I4811 Longstanding persistent atrial fibrillation: Secondary | ICD-10-CM | POA: Diagnosis present

## 2015-04-25 DIAGNOSIS — M1A9XX0 Chronic gout, unspecified, without tophus (tophi): Secondary | ICD-10-CM

## 2015-04-25 DIAGNOSIS — G4733 Obstructive sleep apnea (adult) (pediatric): Secondary | ICD-10-CM

## 2015-04-25 LAB — PROTIME-INR
INR: 2.22 — ABNORMAL HIGH (ref 0.00–1.49)
Prothrombin Time: 24.4 seconds — ABNORMAL HIGH (ref 11.6–15.2)

## 2015-04-25 LAB — CBC
HEMATOCRIT: 39.2 % (ref 39.0–52.0)
Hemoglobin: 12.7 g/dL — ABNORMAL LOW (ref 13.0–17.0)
MCH: 28.6 pg (ref 26.0–34.0)
MCHC: 32.4 g/dL (ref 30.0–36.0)
MCV: 88.3 fL (ref 78.0–100.0)
PLATELETS: 151 10*3/uL (ref 150–400)
RBC: 4.44 MIL/uL (ref 4.22–5.81)
RDW: 14 % (ref 11.5–15.5)
WBC: 7.4 10*3/uL (ref 4.0–10.5)

## 2015-04-25 LAB — TROPONIN I: Troponin I: <0.03 ng/mL (ref <0.031)

## 2015-04-25 LAB — COMPREHENSIVE METABOLIC PANEL
ALT: 35 U/L (ref 17–63)
AST: 27 U/L (ref 15–41)
Albumin: 3.7 g/dL (ref 3.5–5.0)
Alkaline Phosphatase: 52 U/L (ref 38–126)
Anion gap: 11 (ref 5–15)
BILIRUBIN TOTAL: 1.3 mg/dL — AB (ref 0.3–1.2)
BUN: 15 mg/dL (ref 6–20)
CHLORIDE: 103 mmol/L (ref 101–111)
CO2: 28 mmol/L (ref 22–32)
CREATININE: 1.06 mg/dL (ref 0.61–1.24)
Calcium: 9.1 mg/dL (ref 8.9–10.3)
GFR calc Af Amer: >60 mL/min (ref >60)
Glucose, Bld: 107 mg/dL — ABNORMAL HIGH (ref 65–99)
Potassium: 4.2 mmol/L (ref 3.5–5.1)
Sodium: 142 mmol/L (ref 135–145)
TOTAL PROTEIN: 6.6 g/dL (ref 6.5–8.1)

## 2015-04-25 LAB — TSH: TSH: 3.297 u[IU]/mL (ref 0.350–4.500)

## 2015-04-25 LAB — HIV ANTIBODY (ROUTINE TESTING W REFLEX): HIV Screen 4th Generation wRfx: NONREACTIVE

## 2015-04-25 LAB — PHOSPHORUS: Phosphorus: 3.5 mg/dL (ref 2.5–4.6)

## 2015-04-25 LAB — MRSA PCR SCREENING: MRSA BY PCR: NEGATIVE

## 2015-04-25 LAB — MAGNESIUM: Magnesium: 1.9 mg/dL (ref 1.7–2.4)

## 2015-04-25 LAB — VITAMIN B12: VITAMIN B 12: 940 pg/mL — AB (ref 180–914)

## 2015-04-25 MED ORDER — DILTIAZEM HCL ER COATED BEADS 180 MG PO CP24
180.0000 mg | ORAL_CAPSULE | Freq: Every day | ORAL | Status: DC
  Administered 2015-04-25 – 2015-04-27 (×3): 180 mg via ORAL
  Filled 2015-04-25 (×3): qty 1

## 2015-04-25 MED ORDER — PROMETHAZINE HCL 25 MG/ML IJ SOLN: 12.5000 mg | Freq: Four times a day (QID) | INTRAMUSCULAR | Status: DC | PRN

## 2015-04-25 MED ORDER — MAGNESIUM SULFATE 2 GM/50ML IV SOLN
2.0000 g | Freq: Once | INTRAVENOUS | Status: AC
  Administered 2015-04-26: 2 g via INTRAVENOUS
  Filled 2015-04-25: qty 50

## 2015-04-25 MED ORDER — POLYETHYLENE GLYCOL 3350 17 G PO PACK
17.0000 g | PACK | Freq: Every day | ORAL | Status: DC
  Administered 2015-04-25 – 2015-04-26 (×2): 17 g via ORAL
  Filled 2015-04-25 (×2): qty 1

## 2015-04-25 MED ORDER — SENNOSIDES-DOCUSATE SODIUM 8.6-50 MG PO TABS
1.0000 | ORAL_TABLET | Freq: Two times a day (BID) | ORAL | Status: DC
  Administered 2015-04-25 – 2015-04-27 (×4): 1 via ORAL
  Filled 2015-04-25 (×4): qty 1

## 2015-04-25 MED ORDER — TRAMADOL HCL 50 MG PO TABS
50.0000 mg | ORAL_TABLET | Freq: Four times a day (QID) | ORAL | Status: DC
  Administered 2015-04-25 – 2015-04-27 (×9): 50 mg via ORAL
  Filled 2015-04-25 (×9): qty 1

## 2015-04-25 MED ORDER — BENAZEPRIL HCL 10 MG PO TABS
10.0000 mg | ORAL_TABLET | Freq: Every day | ORAL | Status: DC
  Administered 2015-04-25 – 2015-04-27 (×3): 10 mg via ORAL
  Filled 2015-04-25 (×4): qty 1

## 2015-04-25 NOTE — Care Management Obs Status (Signed)
[facility] NOTIFICATION   Patient Details  Name: [name] MRN: [id number] Date of Birth: [birth year]   Medicare Observation Status Notification Given:  Yes    Bethena Roys, RN 04/25/2015, 10:16 AM

## 2015-04-25 NOTE — Evaluation (Signed)
Physical Therapy Evaluation Patient Details Name: [name] MRN: [id number] DOB: 10-06-1945 Today's Date: 04/25/2015   History of Present Illness  Pt is a 70 y/o M admitted w/ chest pain and noted to be in a-fib w/ RVR.  Pt's PMH includes gout, CVA w/ resultant Lt spastic hemiplegia, chronic pain, CABG, diastolic CHF.    Clinical Impression  Pt admitted with above diagnosis. Pt currently with functional limitations due to the deficits listed below (see PT Problem List). [name] is at his baseline PLOF but requires supervision for safety w/ transfers and ambulation.  He will have 24/7 assist available from his wife and family at d/c and is agreeable to OPPT.  Pt will benefit from skilled PT, w/ a focus on balance interventions, to increase their independence and safety with mobility to allow discharge to the venue listed below.      Follow Up Recommendations Outpatient PT;Supervision for mobility/OOB    Equipment Recommendations  None recommended by PT    Recommendations for Other Services OT consult     Precautions / Restrictions Precautions Precautions: Fall Restrictions Weight Bearing Restrictions: No      Mobility  Bed Mobility Overal bed mobility: Modified Independent             General bed mobility comments: HOB elevated, no cues or physical assist needed  Transfers Overall transfer level: Needs assistance Equipment used: None Transfers: Sit to/from Stand Sit to Stand: Supervision         General transfer comment: Pt w/ safe technique and reaching back for armrests when standing.  Min instability, supervision for safety.  Ambulation/Gait Ambulation/Gait assistance: Min guard Ambulation Distance (Feet): 150 Feet Assistive device: None Gait Pattern/deviations: Decreased stride length;Step-to pattern;Steppage;Antalgic;Narrow base of support;Trunk flexed   Gait velocity interpretation: Below normal speed for age/gender General Gait Details: Steppage  gait and Lt hip hike to achieve Lt foot clearance.  Cues for upright posture.  HR up to 121  Stairs            Wheelchair Mobility    Modified Rankin (Stroke Patients Only)       Balance Overall balance assessment: Needs assistance (denies any falls in the past 6 months) Sitting-balance support: Bilateral upper extremity supported;Feet supported Sitting balance-Leahy Scale: Good     Standing balance support: No upper extremity supported;During functional activity Standing balance-Leahy Scale: Fair                               Pertinent Vitals/Pain Pain Assessment: No/denies pain    Home Living Family/patient expects to be discharged to:: Private residence Living Arrangements: Spouse/significant other;Children (daughter and her family (temporary)) Available Help at Discharge: Family;Available 24 hours/day Type of Home: House Home Access: Stairs to enter Entrance Stairs-Rails: Left Entrance Stairs-Number of Steps: 2 Home Layout: One level Home Equipment: Walker - 2 wheels      Prior Function Level of Independence: Independent         Comments: still driving, enjoys Museum/gallery conservator        Extremity/Trunk Assessment   Upper Extremity Assessment: LUE deficits/detail       LUE Deficits / Details: strength grossly 4/5   Lower Extremity Assessment: LLE deficits/detail   LLE Deficits / Details: strength grossly 4/5  Cervical / Trunk Assessment: Kyphotic  Communication   Communication: No difficulties  Cognition Arousal/Alertness: Awake/alert Behavior During Therapy: WFL for tasks assessed/performed Overall Cognitive  Status: Within Functional Limits for tasks assessed                      General Comments      Exercises        Assessment/Plan    PT Assessment Patient needs continued PT services  PT Diagnosis Difficulty walking;Abnormality of gait   PT Problem List Decreased strength;Decreased range of  motion;Decreased activity tolerance;Decreased balance;Decreased mobility;Decreased coordination;Decreased knowledge of use of DME;Decreased safety awareness;Impaired sensation  PT Treatment Interventions DME instruction;Gait training;Stair training;Functional mobility training;Therapeutic activities;Therapeutic exercise;Balance training;Neuromuscular re-education;Patient/family education   PT Goals (Current goals can be found in the Care Plan section) Acute Rehab PT Goals Patient Stated Goal: to go home PT Goal Formulation: With patient Time For Goal Achievement: 05/04/15 Potential to Achieve Goals: Good    Frequency Min 3X/week   Barriers to discharge Inaccessible home environment 2 steps to enter home    Co-evaluation               End of Session Equipment Utilized During Treatment: Gait belt Activity Tolerance: Patient tolerated treatment well Patient left: in chair;with call bell/phone within reach;with chair alarm set Nurse Communication: Mobility status    Functional Assessment Tool Used: Clinical Judgement Functional Limitation: Mobility: Walking and moving around Mobility: Walking and Moving Around Current Status [phone]): At least 1 percent but less than 20 percent impaired, limited or restricted Mobility: Walking and Moving Around Goal Status [phone]): At least 1 percent but less than 20 percent impaired, limited or restricted    Time: [id number] PT Time Calculation (min) (ACUTE ONLY): 27 min   Charges:   PT Evaluation $PT Eval Moderate Complexity: 1 Procedure PT Treatments $Gait Training: 8-22 mins   PT G Codes:   PT G-Codes **NOT FOR INPATIENT CLASS** Functional Assessment Tool Used: Clinical Judgement Functional Limitation: Mobility: Walking and moving around Mobility: Walking and Moving Around Current Status [id number]): At least 1 percent but less than 20 percent impaired, limited or restricted Mobility: Walking and Moving Around Goal Status [phone]): At least 1  percent but less than 20 percent impaired, limited or restricted    Joslyn Hy PT, DPT [phone] Pager: [phone] 04/25/2015, 2:10 PM

## 2015-04-25 NOTE — Plan of Care (Signed)
Problem: Education: Goal: Knowledge of [facility] Education information/materials will improve Outcome: Progressing Reviewed safety policies and call light, white board and bed alarm system and patient verbalized understanding

## 2015-04-25 NOTE — Consult Note (Addendum)
Reason for Consult:   AF with RVR  Requesting Physician: Dr Linus Salmons Primary Cardiologist Dr Aundra Dubin  HPI:   70 yo with history of CAD s/p CABG in 2004.  He had a cath in 2011 showing occlusion of SVG-RCA and SVG-OM. He underwent placement of a native CFX DES. Myoview was low risk in Aug 2013.  He has a history of PAF and prior CVA with residual deficit. He had  DCCV in 2013, and has maintained NSR. He is on chronic Coumadin Rx. Other problems include, DM, HTN, grade 2 diastolic dysfunction, and OSA- C-PAP intoleratnt. He had normal EF by echo Sept 2014 with normal LA size. He has residual gait difficulty because of his stroke. LOV was March 2016 and he appeared to be in NSR on exam then. He was stable from a cardiac standpoint.   He is admitted now after he developed Lt chest pain with Lt arm radiation 04/24/15. He was not doing anything strenuous. He was noted to be in AF with RVR on admission-(though max HR recorded is only 113). He was admitted and placed on IV Diltiazem. His INR is therapeutic 2.22. The pt tells me he thinks he has been in AF for the past 48 hrs. He thinks it started when he became constipated (secondary to starting OTC Osteo-bi flex for his knees) and strained having a BM. His rate is controlled, Troponin negative x 4. No further chest discomfort.   PMHx:  Past Medical History  Diagnosis Date  . GERD (gastroesophageal reflux disease)   . Gout   . Hyperlipidemia   . Hypertension   . CVA (cerebral infarction)     left hemiparesis  . Chronic pain     left sided-[place]  . CAD (coronary artery disease)     s/p CABG, s/p DES to LCX  January 2011  . OSA on CPAP   . Sleep apnea   . History of colonoscopy   . Stroke ([place])   . Dysrhythmia     hx of atrial fibrilation with cardioversion  . Hypothyroidism   . Diastolic CHF [facility])     Past Surgical History  Procedure Laterality Date  . Coronary artery bypass graft      stent  . Esophagogastroduodenoscopy   03-25-2005  . Nasal septum surgery    . Percutaneous placement intravascular stent cervical carotid artery      03-2009; using a drug-eluting platform of the circumflex cornoray artery with a 3.0 x 18 [employer] Promus drug-eluting platform post dilated to 3.75 with a noncompliant balloon.  . Cataract extraction  05/2010    left eye  . Cataract extraction  04/2010    right eye  . Cardioversion  06/20/2011    Procedure: CARDIOVERSION;  Surgeon: Larey Dresser, MD;  Location: [facility] ENDOSCOPY;  Service: Cardiovascular;  Laterality: N/A;  . Tee without cardioversion  06/20/2011    Procedure: TRANSESOPHAGEAL ECHOCARDIOGRAM (TEE);  Surgeon: Larey Dresser, MD;  Location: [facility] ENDOSCOPY;  Service: Cardiovascular;  Laterality: N/A;    SOCHx:  reports that he has quit smoking. He has never used smokeless tobacco. He reports that he does not drink alcohol or use illicit drugs.  FAMHx: Family History  Problem Relation Age of Onset  . Lung cancer Father     deceased  . Stroke Mother     deceased-[place]ES    ALLERGIES: No Known Allergies  ROS: Review of Systems: General: negative for chills, fever, night sweats or  weight changes.  Cardiovascular: negative for chest pain, dyspnea on exertion, edema, orthopnea, palpitations, paroxysmal nocturnal dyspnea or shortness of breath HEENT: negative for any visual disturbances, blindness, glaucoma Dermatological: negative for rash Respiratory: negative for cough, hemoptysis, or wheezing Urologic: negative for hematuria or dysuria Abdominal: negative for nausea, vomiting, diarrhea, bright red blood per rectum, melena, or hematemesis Neurologic: negative for visual changes, syncope, or dizziness Musculoskeletal: negative for back pain, joint pain, or swelling Psych: cooperative and appropriate All other systems reviewed and are otherwise negative except as noted above.   HOME MEDICATIONS: Prior to Admission medications   Medication Sig Start Date  End Date Taking? Authorizing Provider  allopurinol (ZYLOPRIM) 100 MG tablet TAKE 2 TABLETS BY MOUTH DAILY. D/C PREVIOUS SCRIPTS FOR THIS MEDICATION 01/12/15  Yes Debbrah Alar, NP  atorvastatin (LIPITOR) 40 MG tablet Take 2 tablets (80 mg total) by mouth daily. 03/05/15  Yes Debbrah Alar, NP  benazepril (LOTENSIN) 10 MG tablet TAKE 1 TABLET BY MOUTH DAILY D/C PREVIOUS SCRIPTS FOR THIS MEDICATION 10/23/14  Yes Debbrah Alar, NP  diltiazem (CARDIZEM CD) 180 MG 24 hr capsule TAKE 1 CAPSULE EVERY DAY 02/20/15  Yes Debbrah Alar, NP  fluticasone (FLONASE) 50 MCG/ACT nasal spray Place 2 sprays into both nostrils daily. 06/02/14  Yes Brunetta Jeans, PA-C  furosemide (LASIX) 40 MG tablet TAKE 1 TABLET BY MOUTH EVERY OTHER DAY 02/20/15  Yes Debbrah Alar, NP  levothyroxine (SYNTHROID, LEVOTHROID) 112 MCG tablet TAKE 1 TABLET (112 MCG TOTAL) BY MOUTH DAILY BEFORE BREAKFAST. 12/12/14  Yes Debbrah Alar, NP  metFORMIN (GLUCOPHAGE) 500 MG tablet TAKE 1 TABLET EVERY DAY WITH BREAKFAST 01/17/15  Yes Debbrah Alar, NP  Multiple Vitamins-Minerals (MULTIVITAMIN GUMMIES MENS PO) Take 1 tablet by mouth daily.   Yes Historical Provider, MD  nitroGLYCERIN (NITROSTAT) 0.4 MG SL tablet Place 1 tablet (0.4 mg total) under the tongue every 5 (five) minutes as needed for chest pain. Up to 3 doses 03/05/15  Yes Debbrah Alar, NP  pantoprazole (PROTONIX) 40 MG tablet Take 1 tablet (40 mg total) by mouth daily. 03/05/15  Yes Debbrah Alar, NP  traMADol (ULTRAM) 50 MG tablet Take 1 tablet (50 mg total) by mouth every 6 (six) hours as needed. 01/01/15  Yes Charlett Blake, MD  traMADol (ULTRAM-ER) 200 MG 24 hr tablet TAKE 1 TABLET EVERY DAY WITH AM DOSE OF TRAMADOL 50 MG 01/01/15  Yes Charlett Blake, MD  vitamin B-12 1000 MCG tablet Take 1 tablet (1,000 mcg total) by mouth daily. 08/11/13  Yes Orson Eva, MD  warfarin (COUMADIN) 2.5 MG tablet Take 2.5 mg by mouth daily.   Yes Historical  Provider, MD    HOSPITAL MEDICATIONS: I have reviewed the patient's current medications.  VITALS: Blood pressure 130/106, pulse 68, temperature 97.7 F (36.5 C), temperature source Oral, resp. rate 14, height 6\' 3"  (1.905 m), weight 257 lb 8 oz (116.801 kg), SpO2 97 %.  PHYSICAL EXAM: General appearance: alert, cooperative, no distress and mildly obese Lungs: scattered rhonchi Heart: irregularly irregular rhythm Abdomen: obese, non tender Extremities: extremities normal, atraumatic, no cyanosis or edema Pulses: 2+ and symmetric Skin: Skin color, texture, turgor normal. No rashes or lesions Neurologic: Grossly normal, he has LUE weakness and spasticity secondary to his stroke.   LABS: Results for orders placed or performed during the hospital encounter of 04/24/15 (from the past 24 hour(s))  Basic metabolic panel     Status: Abnormal   Collection Time: 04/24/15  7:11 PM  Result Value Ref  Range   Sodium 140 135 - 145 mmol/L   Potassium 4.6 3.5 - 5.1 mmol/L   Chloride 102 101 - 111 mmol/L   CO2 28 22 - 32 mmol/L   Glucose, Bld 113 (H) 65 - 99 mg/dL   BUN 18 6 - 20 mg/dL   Creatinine, Ser 1.25 (H) 0.61 - 1.24 mg/dL   Calcium 9.4 8.9 - 10.3 mg/dL   GFR calc non Af Amer 56 (L) >60 mL/min   GFR calc Af Amer >60 >60 mL/min   Anion gap 10 5 - 15  CBC     Status: Abnormal   Collection Time: 04/24/15  7:11 PM  Result Value Ref Range   WBC 7.2 4.0 - 10.5 K/uL   RBC 4.62 4.22 - 5.81 MIL/uL   Hemoglobin 13.2 13.0 - 17.0 g/dL   HCT 41.2 39.0 - 52.0 %   MCV 89.2 78.0 - 100.0 fL   MCH 28.6 26.0 - 34.0 pg   MCHC 32.0 30.0 - 36.0 g/dL   RDW 13.9 11.5 - 15.5 %   Platelets 146 (L) 150 - 400 K/uL  Protime-INR - (order if Patient is taking Coumadin / Warfarin)     Status: Abnormal   Collection Time: 04/24/15  7:11 PM  Result Value Ref Range   Prothrombin Time 25.1 (H) 11.6 - 15.2 seconds   INR 2.30 (H) 0.00 - 1.49  Troponin I     Status: None   Collection Time: 04/24/15  8:19 PM    Result Value Ref Range   Troponin I <0.03 <0.031 ng/mL  Troponin I     Status: None   Collection Time: 04/24/15 10:10 PM  Result Value Ref Range   Troponin I <0.03 <0.031 ng/mL  MRSA PCR Screening     Status: None   Collection Time: 04/25/15  2:00 AM  Result Value Ref Range   MRSA by PCR NEGATIVE NEGATIVE  Comprehensive metabolic panel     Status: Abnormal   Collection Time: 04/25/15  2:18 AM  Result Value Ref Range   Sodium 142 135 - 145 mmol/L   Potassium 4.2 3.5 - 5.1 mmol/L   Chloride 103 101 - 111 mmol/L   CO2 28 22 - 32 mmol/L   Glucose, Bld 107 (H) 65 - 99 mg/dL   BUN 15 6 - 20 mg/dL   Creatinine, Ser 1.06 0.61 - 1.24 mg/dL   Calcium 9.1 8.9 - 10.3 mg/dL   Total Protein 6.6 6.5 - 8.1 g/dL   Albumin 3.7 3.5 - 5.0 g/dL   AST 27 15 - 41 U/L   ALT 35 17 - 63 U/L   Alkaline Phosphatase 52 38 - 126 U/L   Total Bilirubin 1.3 (H) 0.3 - 1.2 mg/dL   GFR calc non Af Amer >60 >60 mL/min   GFR calc Af Amer >60 >60 mL/min   Anion gap 11 5 - 15  Protime-INR     Status: Abnormal   Collection Time: 04/25/15  2:18 AM  Result Value Ref Range   Prothrombin Time 24.4 (H) 11.6 - 15.2 seconds   INR 2.22 (H) 0.00 - 1.49  TSH     Status: None   Collection Time: 04/25/15  2:18 AM  Result Value Ref Range   TSH 3.297 0.350 - 4.500 uIU/mL  CBC     Status: Abnormal   Collection Time: 04/25/15  2:18 AM  Result Value Ref Range   WBC 7.4 4.0 - 10.5 K/uL   RBC 4.44 4.22 -  5.81 MIL/uL   Hemoglobin 12.7 (L) 13.0 - 17.0 g/dL   HCT 39.2 39.0 - 52.0 %   MCV 88.3 78.0 - 100.0 fL   MCH 28.6 26.0 - 34.0 pg   MCHC 32.4 30.0 - 36.0 g/dL   RDW 14.0 11.5 - 15.5 %   Platelets 151 150 - 400 K/uL  Magnesium     Status: None   Collection Time: 04/25/15  2:18 AM  Result Value Ref Range   Magnesium 1.9 1.7 - 2.4 mg/dL  Phosphorus     Status: None   Collection Time: 04/25/15  2:18 AM  Result Value Ref Range   Phosphorus 3.5 2.5 - 4.6 mg/dL  Troponin I     Status: None   Collection Time: 04/25/15   2:18 AM  Result Value Ref Range   Troponin I <0.03 <0.031 ng/mL  Vitamin B12     Status: Abnormal   Collection Time: 04/25/15  2:18 AM  Result Value Ref Range   Vitamin B-12 940 (H) 180 - 914 pg/mL  Troponin I     Status: None   Collection Time: 04/25/15  7:40 AM  Result Value Ref Range   Troponin I <0.03 <0.031 ng/mL    EKG: AF, LVH by volts  IMAGING: Dg Chest 2 View  04/24/2015  CLINICAL DATA:  Epigastric pain and umbilical pain that began today. No shortness of breath. EXAM: CHEST  2 VIEW COMPARISON:  11/25/2014. FINDINGS: Cardiomegaly. Prior CABG. Chronic RIGHT hemidiaphragm elevation. No active infiltrates or failure. No effusion or pneumothorax. Bones unremarkable. IMPRESSION: Cardiomegaly.  No active disease. Electronically Signed   By: Staci Righter M.D.   On: 04/24/2015 19:34    IMPRESSION: Principal Problem:   Atrial fibrillation with RVR ([place]) Active Problems:   Essential hypertension   Hx of CABG '04   Diabetes type 2, controlled ([place])   Diastolic CHF -EF [id number] with grade 2 DD 2014   CAD S/P CFX DES 2011   PAF - CAHDs VASc=7   Hyperlipidemia   Obstructive sleep apnea-Failed CPAP   History of stroke with residual deficit   Long term (current) use of anticoagulants   Spastic hemiplegia affecting nondominant side ([place])   Hypothyroidism   RECOMMENDATION: Will review with MD  Time Spent Directly with Patient: 45 minutes  Kerin Ransom, [place] beeper 04/25/2015, 12:45 PM   Patient seen, examined. Available data reviewed. Agree with findings, assessment, and plan as outlined by Kerin Ransom, PA-C. The patient is independently interviewed and examined. His chart is reviewed, including outpatient cardiology visits and imaging studies (echo). The patient was last seen by Dr. Aundra Dubin in March 2016. He has a history of CABG and paroxysmal atrial fibrillation. He also has a history of stroke and has been maintained on long-term coagulation with warfarin. he presents  now with discomfort in the chest, left arm, and left leg. He also has a feeling of weakness that he has associated with his atrial fibrillation in the past. Thinks this is just occurred in the past few days. On exam, he is an obese male in no distress. Lung fields are clear. Heart is irregularly irregular without murmur or gallop. Abdomen is soft and obese. There is no peripheral edema present. Telemetry demonstrates rate controlled atrial fibrillation. Review of his last echo study from 2014 shows normal LV systolic function with an LVEF of [id number], diastolic dysfunction, and normal left atrial size. His INR values have been therapeutic over the last 3 months. I think the  most prudent course is to perform cardioversion. We'll try to schedule this tomorrow. With his diastolic dysfunction, obesity, and symptomatic atrial fibrillation, I think he will clinically do better in sinus rhythm. I reviewed EKGs over the past few years and he has primarily been in sinus rhythm. If he has recurrent atrial fibrillation we'll need to consider an antiarrhythmic drug. I do not think ischemic testing is indicated at this time. May be reasonable to consider a nuclear scan after he is out of the hospital. Will keep nothing by mouth after midnight in case we're able to get him on to the anesthesia scheduled tomorrow for cardioversion. Will update his echo this admission.  Sherren Mocha, M.D. 04/25/2015 4:52 PM

## 2015-04-25 NOTE — Progress Notes (Signed)
Subjective: [name] is doing well this morning and reports no acute events overnight since admission.  States he has previously been cardioverted in the past.  This morning denies chest pain, shortness of breath.  No other complaints.  Objective: Vital signs in last 24 hours: Filed Vitals:   04/25/15 0600 04/25/15 0650 04/25/15 0700 04/25/15 0900  BP: 137/104 126/99  130/106  Pulse: 60   68  Temp:   97.6 F (36.4 C)   TempSrc:   Oral Oral  Resp: 15   14  Height:      Weight:      SpO2: 98%   97%   Weight change:   Intake/Output Summary (Last 24 hours) at 04/25/15 1019 Last data filed at 04/25/15 0900  Gross per 24 hour  Intake     25 ml  Output      0 ml  Net     25 ml   General: lying flat in bed, no distress HEENT: EOMI, no scleral icterus Cardiac: irregular rhythm, no rubs, murmurs or gallops Pulm: normal work of breathing, moving normal volumes of air Abd: soft, nontender, nondistended, BS present Ext: warm and well perfused, no pedal edema Neuro: alert and oriented, no focal deficits  Lab Results: Basic Metabolic Panel:  Recent Labs Lab 04/24/15 1911 04/25/15 0218  NA 140 142  K 4.6 4.2  CL 102 103  CO2 28 28  GLUCOSE 113* 107*  BUN 18 15  CREATININE 1.25* 1.06  CALCIUM 9.4 9.1  MG  --  1.9  PHOS  --  3.5   Liver Function Tests:  Recent Labs Lab 04/25/15 0218  AST 27  ALT 35  ALKPHOS 52  BILITOT 1.3*  PROT 6.6  ALBUMIN 3.7   No results for input(s): LIPASE, AMYLASE in the last 168 hours. No results for input(s): AMMONIA in the last 168 hours. CBC:  Recent Labs Lab 04/24/15 1911 04/25/15 0218  WBC 7.2 7.4  HGB 13.2 12.7*  HCT 41.2 39.2  MCV 89.2 88.3  PLT 146* 151   Cardiac Enzymes:  Recent Labs Lab 04/24/15 2210 04/25/15 0218 04/25/15 0740  TROPONINI <0.03 <0.03 <0.03   BNP: No results for input(s): PROBNP in the last 168 hours. D-Dimer: No results for input(s): DDIMER in the last 168 hours. CBG: No results for  input(s): GLUCAP in the last 168 hours. Hemoglobin A1C: No results for input(s): HGBA1C in the last 168 hours. Fasting Lipid Panel: No results for input(s): CHOL, HDL, LDLCALC, TRIG, CHOLHDL, LDLDIRECT in the last 168 hours. Thyroid Function Tests:  Recent Labs Lab 04/25/15 0218  TSH 3.297   Coagulation:  Recent Labs Lab 04/20/15 0844 04/24/15 1911 04/25/15 0218  LABPROT  --  25.1* 24.4*  INR 2.7 2.30* 2.22*   Anemia Panel:  Recent Labs Lab 04/25/15 0218  VITAMINB12 940*   Urine Drug Screen: Drugs of Abuse     Component Value Date/Time   LABOPIA NONE DETECTED 08/10/2013 1210   COCAINSCRNUR NONE DETECTED 08/10/2013 1210   LABBENZ NONE DETECTED 08/10/2013 1210   AMPHETMU NONE DETECTED 08/10/2013 1210   THCU NONE DETECTED 08/10/2013 1210   LABBARB NONE DETECTED 08/10/2013 1210    Alcohol Level: No results for input(s): ETH in the last 168 hours. Urinalysis: No results for input(s): COLORURINE, LABSPEC, PHURINE, GLUCOSEU, HGBUR, BILIRUBINUR, KETONESUR, PROTEINUR, UROBILINOGEN, NITRITE, LEUKOCYTESUR in the last 168 hours.  Invalid input(s): APPERANCEUR Misc. Labs:   Micro Results: Recent Results (from the past 240 hour(s))  MRSA  PCR Screening     Status: None   Collection Time: 04/25/15  2:00 AM  Result Value Ref Range Status   MRSA by PCR NEGATIVE NEGATIVE Final    Comment:        The GeneXpert MRSA Assay (FDA approved for NASAL specimens only), is one component of a comprehensive MRSA colonization surveillance program. It is not intended to diagnose MRSA infection nor to guide or monitor treatment for MRSA infections.    Studies/Results: Dg Chest 2 View  04/24/2015  CLINICAL DATA:  Epigastric pain and umbilical pain that began today. No shortness of breath. EXAM: CHEST  2 VIEW COMPARISON:  11/25/2014. FINDINGS: Cardiomegaly. Prior CABG. Chronic RIGHT hemidiaphragm elevation. No active infiltrates or failure. No effusion or pneumothorax. Bones  unremarkable. IMPRESSION: Cardiomegaly.  No active disease. Electronically Signed   By: Staci Righter M.D.   On: 04/24/2015 19:34   Medications: I have reviewed the patient's current medications. Scheduled Meds: . allopurinol  200 mg Oral Daily  . atorvastatin  80 mg Oral Daily  . benazepril  10 mg Oral Daily  . diltiazem  180 mg Oral Daily  . fluticasone  2 spray Each Nare Daily  . levothyroxine  112 mcg Oral QAC breakfast  . pantoprazole  40 mg Oral Daily  . polyethylene glycol  17 g Oral Daily  . senna-docusate  1 tablet Oral BID  . traMADol  50 mg Oral 4 times per day  . warfarin  2.5 mg Oral q1800  . Warfarin - Pharmacist Dosing Inpatient   Does not apply q1800   Continuous Infusions: . diltiazem (CARDIZEM) infusion 5 mg/hr (04/25/15 0309)   PRN Meds:.acetaminophen **OR** acetaminophen, promethazine, traMADol Assessment/Plan: Principal Problem:   Chest pain Active Problems:   Hyperlipidemia   GOUT   Obstructive sleep apnea   Essential hypertension   CAD, NATIVE VESSEL   History of stroke with residual deficit   GERD   Allergic rhinitis   Atrial fibrillation ([place])   Long term (current) use of anticoagulants   Spastic hemiplegia affecting nondominant side (HCC)   Hypothyroidism   B12 deficiency   Constipation   Diastolic CHF (HCC)  Atrial Fibrillation with RVR: patient with history of paroxysmal atrial fibrillation who was admitted after presenting to the ED in a-fib with RVR rates up to 130s before receiving diltiazem bolus and drip.  Unclear how long he has been in atrial fibrillation.  Currently asymptomatic. On Coumadin at home with therapeutic INR. - EKG this morning demonstrates a-fib with rate 86 - stopped diltiazem drip - start diltiazem 180mg  daily - goal maintain HR <110 - cardiology consult for recs  Constipation: History of up to 6-7 days w/o BM. Hard straining with passage of stool yesterday. Maybe causing vasovagal contribution to his symptoms. Uses  stool softener at home with minimal improvement. Abdomen distended and diffusely tender, with prominent bowel gas sounds. -Senokot-S BID -Miralax daily  Left Spastic Hemiparesis s/p Remote CVA: Neuro exam seems stable compared to previously documented. Strength is good and sensation to light touch intact despite complaint of numb sensation at admission. Recent botox injections by Dr. Letta Pate still controlling pain until this event. -Tramadol ER 200mg  -Tramadol 50mg  q6hrs PRN  CAD s/p CABG -Continue home plavix 75mg  -Continue Atorvastatin 80mg  daily  HTN -Continue home lotensin 10mg  -Diltiazem as above  GERD: Stable and has been well controlled on PPI. Current pain presentation not epigastric. -Protonix 40mg  PO  Hypothyroidism -Synthroid 134mcg -TSH within normal limits  Gout: Asymptomatic -Allopurinol  200mg   OSA: Stable.  Need to clarify if patient has been compliant with CPAP in setting of setting off his a-fib -CPAP qhs  CKD: Baseline 1.1-1.2 from 2016 studies. 1.25 on presentation. Does not seem significantly dehydrated nor overloaded. -Hold lasix for now (40mg  QOD PTA)  Diet: NPO  DVT PPx: Coumadin  FULL CODE  Dispo: Disposition is deferred at this time, awaiting improvement of current medical problems.  Anticipated discharge in approximately 0-1 day(s).   The patient does have a current PCP Debbrah Alar, NP) and does not need an [facility] hospital follow-up appointment after discharge.  The patient does not have transportation limitations that hinder transportation to clinic appointments.  .Services Needed at time of discharge: Y = Yes, Blank = No PT:   OT:   RN:   Equipment:   Other:     LOS: 1 day   Jule Ser, DO 04/25/2015, 10:19 AM

## 2015-04-25 NOTE — ED Notes (Signed)
Attempted report 

## 2015-04-26 ENCOUNTER — Encounter (HOSPITAL_COMMUNITY)
Admission: EM | Disposition: A | Discharge status: Home / Self Care | Payer: Self-pay | Source: Home / Self Care | Attending: Internal Medicine

## 2015-04-26 ENCOUNTER — Encounter: Payer: Self-pay | Admitting: Gastroenterology

## 2015-04-26 ENCOUNTER — Encounter (HOSPITAL_COMMUNITY): Payer: Self-pay | Admitting: *Deleted

## 2015-04-26 ENCOUNTER — Observation Stay (HOSPITAL_COMMUNITY): Payer: Medicare Other | Admitting: Anesthesiology

## 2015-04-26 ENCOUNTER — Observation Stay (HOSPITAL_BASED_OUTPATIENT_CLINIC_OR_DEPARTMENT_OTHER): Payer: Medicare Other

## 2015-04-26 DIAGNOSIS — I69354 Hemiplegia and hemiparesis following cerebral infarction affecting left non-dominant side: Secondary | ICD-10-CM | POA: Diagnosis not present

## 2015-04-26 DIAGNOSIS — K5900 Constipation, unspecified: Secondary | ICD-10-CM | POA: Diagnosis not present

## 2015-04-26 DIAGNOSIS — Z9861 Coronary angioplasty status: Secondary | ICD-10-CM

## 2015-04-26 DIAGNOSIS — R0789 Other chest pain: Secondary | ICD-10-CM | POA: Diagnosis not present

## 2015-04-26 DIAGNOSIS — I129 Hypertensive chronic kidney disease with stage 1 through stage 4 chronic kidney disease, or unspecified chronic kidney disease: Secondary | ICD-10-CM | POA: Diagnosis not present

## 2015-04-26 DIAGNOSIS — I10 Essential (primary) hypertension: Secondary | ICD-10-CM

## 2015-04-26 DIAGNOSIS — M109 Gout, unspecified: Secondary | ICD-10-CM | POA: Diagnosis not present

## 2015-04-26 DIAGNOSIS — N189 Chronic kidney disease, unspecified: Secondary | ICD-10-CM | POA: Diagnosis not present

## 2015-04-26 DIAGNOSIS — I4891 Unspecified atrial fibrillation: Secondary | ICD-10-CM

## 2015-04-26 DIAGNOSIS — K219 Gastro-esophageal reflux disease without esophagitis: Secondary | ICD-10-CM | POA: Diagnosis not present

## 2015-04-26 DIAGNOSIS — I2510 Atherosclerotic heart disease of native coronary artery without angina pectoris: Secondary | ICD-10-CM | POA: Diagnosis not present

## 2015-04-26 DIAGNOSIS — I480 Paroxysmal atrial fibrillation: Secondary | ICD-10-CM | POA: Diagnosis not present

## 2015-04-26 HISTORY — PX: CARDIOVERSION: SHX1299

## 2015-04-26 LAB — CBC
HCT: 36.4 % — ABNORMAL LOW (ref 39.0–52.0)
Hemoglobin: 12.1 g/dL — ABNORMAL LOW (ref 13.0–17.0)
MCH: 29.9 pg (ref 26.0–34.0)
MCHC: 33.2 g/dL (ref 30.0–36.0)
MCV: 89.9 fL (ref 78.0–100.0)
Platelets: 124 10*3/uL — ABNORMAL LOW (ref 150–400)
RBC: 4.05 MIL/uL — ABNORMAL LOW (ref 4.22–5.81)
RDW: 14 % (ref 11.5–15.5)
WBC: 5.7 10*3/uL (ref 4.0–10.5)

## 2015-04-26 LAB — PROTIME-INR
INR: 2.17 — ABNORMAL HIGH (ref 0.00–1.49)
Prothrombin Time: 24 seconds — ABNORMAL HIGH (ref 11.6–15.2)

## 2015-04-26 SURGERY — CARDIOVERSION
Anesthesia: General

## 2015-04-26 MED ORDER — DOCUSATE SODIUM 100 MG PO CAPS
100.0000 mg | ORAL_CAPSULE | Freq: Two times a day (BID) | ORAL | Status: DC
  Administered 2015-04-26 – 2015-04-27 (×2): 100 mg via ORAL
  Filled 2015-04-26 (×2): qty 1

## 2015-04-26 MED ORDER — SODIUM CHLORIDE 0.9 % IV SOLN
INTRAVENOUS | Status: DC
  Administered 2015-04-26: 12:00:00 via INTRAVENOUS

## 2015-04-26 MED ORDER — LIDOCAINE HCL (CARDIAC) 20 MG/ML IV SOLN
INTRAVENOUS | Status: DC | PRN
  Administered 2015-04-26: 40 mg via INTRAVENOUS

## 2015-04-26 MED ORDER — PERFLUTREN LIPID MICROSPHERE
1.0000 mL | INTRAVENOUS | Status: AC | PRN
  Administered 2015-04-26: 2 mL via INTRAVENOUS
  Filled 2015-04-26: qty 10

## 2015-04-26 MED ORDER — PROPOFOL 10 MG/ML IV BOLUS
INTRAVENOUS | Status: DC | PRN
  Administered 2015-04-26: 50 mg via INTRAVENOUS

## 2015-04-26 MED ORDER — MAGNESIUM HYDROXIDE 400 MG/5ML PO SUSP
30.0000 mL | Freq: Every day | ORAL | Status: DC | PRN
  Administered 2015-04-26: 30 mL via ORAL
  Filled 2015-04-26 (×3): qty 30

## 2015-04-26 MED ORDER — POLYETHYLENE GLYCOL 3350 17 G PO PACK
17.0000 g | PACK | Freq: Two times a day (BID) | ORAL | Status: DC
  Administered 2015-04-26: 17 g via ORAL
  Filled 2015-04-26 (×2): qty 1

## 2015-04-26 NOTE — Progress Notes (Signed)
Pt doing well post-DCCV, maintaining SR.  Initial plan was for d/c home.   However, pt still very constipated and felt straining at bowel was what brought on the afib in the first place.   Will add BID Miralax and stool softeners, plus MOM prn.  Possible d/c in am if doing better.  MD to review meds and advise if Diltiazem may be contributing to this.  Rosaria Ferries, Hershal Coria 04/26/2015 6:07 PM Beeper [phone]

## 2015-04-26 NOTE — Anesthesia Postprocedure Evaluation (Signed)
Anesthesia Post Note  Patient: [name]  Procedure(s) Performed: Procedure(s) (LRB): CARDIOVERSION (N/A)  Patient location during evaluation: Endoscopy Anesthesia Type: General Level of consciousness: awake Pain management: pain level controlled Vital Signs Assessment: post-procedure vital signs reviewed and stable Respiratory status: spontaneous breathing and respiratory function stable Cardiovascular status: stable Postop Assessment: adequate PO intake Anesthetic complications: no    Last Vitals:  Filed Vitals:   04/26/15 1425 04/26/15 1538  BP:  115/60  Pulse:  65  Temp:  36.6 C  Resp: 10 20    Last Pain:  Filed Vitals:   04/26/15 1539  PainSc: 0-No pain                 Leidy Massar

## 2015-04-26 NOTE — CV Procedure (Signed)
    Cardioversion Note  [name] [id number] 1945/05/17  Procedure: DC Cardioversion Indications: atrial fibrillation  Procedure Details Consent: Obtained Time Out: Verified patient identification, verified procedure, site/side was marked, verified correct patient position, special equipment/implants available, Radiology Safety Procedures followed,  medications/allergies/relevent history reviewed, required imaging and test results available.  Performed  The patient has been on adequate anticoagulation.  The patient received 50 mg of iv Propofol and 40 mg of iv Lidocain for sedation administered by anesthesia staff.  Synchronous cardioversion was performed at 120 joules.  The cardioversion was successful.  Complications: No apparent complications Patient did tolerate procedure well.  Dorothy Spark, MD, [facility] 04/26/2015, 2:19 PM

## 2015-04-26 NOTE — Anesthesia Preprocedure Evaluation (Signed)
Anesthesia Evaluation  Patient identified by MRN, date of birth, ID band Patient awake    Reviewed: Allergy & Precautions, H&P , NPO status , Patient's Chart, lab work & pertinent test results  History of Anesthesia Complications Negative for: history of anesthetic complications  Airway Mallampati: II  TM Distance: >3 FB Neck ROM: Full    Dental no notable dental hx. (+) Dental Advisory Given   Pulmonary sleep apnea and Oxygen sleep apnea , former smoker,  2L at night   breath sounds clear to auscultation       Cardiovascular hypertension, Pt. on medications + CAD and +CHF  + dysrhythmias Atrial Fibrillation  Rhythm:Irregular     Neuro/Psych TIACVA negative psych ROS   GI/Hepatic Neg liver ROS, GERD  Medicated and Controlled,  Endo/Other  diabetesHypothyroidism   Renal/GU Renal InsufficiencyRenal disease     Musculoskeletal   Abdominal   Peds  Hematology   Anesthesia Other Findings   Reproductive/Obstetrics                             Anesthesia Physical Anesthesia Plan  ASA: III  Anesthesia Plan: General   Post-op Pain Management:    Induction: Intravenous  Airway Management Planned: Mask  Additional Equipment: None  Intra-op Plan:   Post-operative Plan:   Informed Consent: I have reviewed the patients History and Physical, chart, labs and discussed the procedure including the risks, benefits and alternatives for the proposed anesthesia with the patient or authorized representative who has indicated his/her understanding and acceptance.   Dental advisory given  Plan Discussed with: CRNA and Surgeon  Anesthesia Plan Comments:         Anesthesia Quick Evaluation

## 2015-04-26 NOTE — Progress Notes (Signed)
Patient evaluated with resident team.  No acute changes and cardioversion today if possible since he remains in afib.  Has previously been cardioverted.  If again is in afib later may need antiarrhythmic medication.

## 2015-04-26 NOTE — Discharge Instructions (Addendum)
Information on my medicine - Coumadin   (Warfarin)  This medication education was reviewed with me or my healthcare representative as part of my discharge preparation.  The pharmacist that spoke with me during my hospital stay was:  Lavenia Atlas, [facility]  Why was Coumadin prescribed for you? Coumadin was prescribed for you because you have a blood clot or a medical condition that can cause an increased risk of forming blood clots. Blood clots can cause serious health problems by blocking the flow of blood to the heart, lung, or brain. Coumadin can prevent harmful blood clots from forming. As a reminder your indication for Coumadin is:   Stroke Prevention Because Of Atrial Fibrillation  What test will check on my response to Coumadin? While on Coumadin (warfarin) you will need to have an INR test regularly to ensure that your dose is keeping you in the desired range. The INR (international normalized ratio) number is calculated from the result of the laboratory test called prothrombin time (PT).  If an INR APPOINTMENT HAS NOT ALREADY BEEN MADE FOR YOU please schedule an appointment to have this lab work done by your health care provider within 7 days. Your INR goal is usually a number between:  2 to 3 or your provider may give you a more narrow range like 2-2.5.  Ask your health care provider during an office visit what your goal INR is.  What  do you need to  know  About  COUMADIN? Take Coumadin (warfarin) exactly as prescribed by your healthcare provider about the same time each day.  DO NOT stop taking without talking to the doctor who prescribed the medication.  Stopping without other blood clot prevention medication to take the place of Coumadin may increase your risk of developing a new clot or stroke.  Get refills before you run out.  What do you do if you miss a dose? If you miss a dose, take it as soon as you remember on the same day then continue your regularly scheduled regimen the  next day.  Do not take two doses of Coumadin at the same time.  Important Safety Information A possible side effect of Coumadin (Warfarin) is an increased risk of bleeding. You should call your healthcare provider right away if you experience any of the following: ? Bleeding from an injury or your nose that does not stop. ? Unusual colored urine (red or dark brown) or unusual colored stools (red or black). ? Unusual bruising for unknown reasons. ? A serious fall or if you hit your head (even if there is no bleeding).  Some foods or medicines interact with Coumadin (warfarin) and might alter your response to warfarin. To help avoid this: ? Eat a balanced diet, maintaining a consistent amount of Vitamin K. ? Notify your provider about major diet changes you plan to make. ? Avoid alcohol or limit your intake to 1 drink for women and 2 drinks for men per day. (1 drink is 5 oz. wine, 12 oz. beer, or 1.5 oz. liquor.)  Make sure that ANY health care provider who prescribes medication for you knows that you are taking Coumadin (warfarin).  Also make sure the healthcare provider who is monitoring your Coumadin knows when you have started a new medication including herbals and non-prescription products.  Coumadin (Warfarin)  Major Drug Interactions  Increased Warfarin Effect Decreased Warfarin Effect  Alcohol (large quantities) Antibiotics (esp. Septra/Bactrim, Flagyl, Cipro) Amiodarone (Cordarone) Aspirin (ASA) Cimetidine (Tagamet) Megestrol (Megace) NSAIDs (ibuprofen,  naproxen, etc.) Piroxicam (Feldene) Propafenone (Rythmol SR) Propranolol (Inderal) Isoniazid (INH) Posaconazole (Noxafil) Barbiturates (Phenobarbital) Carbamazepine (Tegretol) Chlordiazepoxide (Librium) Cholestyramine (Questran) Griseofulvin Oral Contraceptives Rifampin Sucralfate (Carafate) Vitamin K   Coumadin (Warfarin) Major Herbal Interactions  Increased Warfarin Effect Decreased Warfarin Effect   Garlic Ginseng Ginkgo biloba Coenzyme Q10 Green tea St. Johns wort    Coumadin (Warfarin) FOOD Interactions  Eat a consistent number of servings per week of foods HIGH in Vitamin K (1 serving =  cup)  Collards (cooked, or boiled & drained) Kale (cooked, or boiled & drained) Mustard greens (cooked, or boiled & drained) Parsley *serving size only =  cup Spinach (cooked, or boiled & drained) Swiss chard (cooked, or boiled & drained) Turnip greens (cooked, or boiled & drained)  Eat a consistent number of servings per week of foods MEDIUM-HIGH in Vitamin K (1 serving = 1 cup)  Asparagus (cooked, or boiled & drained) Broccoli (cooked, boiled & drained, or raw & chopped) Brussel sprouts (cooked, or boiled & drained) *serving size only =  cup Lettuce, raw (green leaf, endive, romaine) Spinach, raw Turnip greens, raw & chopped   These websites have more information on Coumadin (warfarin):  FailFactory.se; VeganReport.com.au;   Atrial Fibrillation Atrial fibrillation is a type of heartbeat that is irregular or fast (rapid). If you have this condition, your heart keeps quivering in a weird (chaotic) way. This condition can make it so your heart cannot pump blood normally. Having this condition gives a person more risk for stroke, heart failure, and other heart problems. There are different types of atrial fibrillation. Talk with your doctor to learn about the type that you have. HOME CARE  Take over-the-counter and prescription medicines only as told by your doctor.  If your doctor prescribed a blood-thinning medicine, take it exactly as told. Taking too much of it can cause bleeding. If you do not take enough of it, you will not have the protection that you need against stroke and other problems.  Do not use any tobacco products. These include cigarettes, chewing tobacco, and e-cigarettes. If you need help quitting, ask your doctor.  If you have apnea  (obstructive sleep apnea), manage it as told by your doctor.  Do not drink alcohol.  Do not drink beverages that have caffeine. These include coffee, soda, and tea.  Maintain a healthy weight. Do not use diet pills unless your doctor says they are safe for you. Diet pills may make heart problems worse.  Follow diet instructions as told by your doctor.  Exercise regularly as told by your doctor.  Keep all follow-up visits as told by your doctor. This is important. GET HELP IF:  You notice a change in the speed, rhythm, or strength of your heartbeat.  You are taking a blood-thinning medicine and you notice more bruising.  You get tired more easily when you move or exercise. GET HELP RIGHT AWAY IF:  You have pain in your chest or your belly (abdomen).  You have sweating or weakness.  You feel sick to your stomach (nauseous).  You notice blood in your throw up (vomit), poop (stool), or pee (urine).  You are short of breath.  You suddenly have swollen feet and ankles.  You feel dizzy.  Your suddenly get weak or numb in your face, arms, or legs, especially if it happens on one side of your body.  You have trouble talking, trouble understanding, or both.  Your face or your eyelid droops on one side. These symptoms may  be an emergency. Do not wait to see if the symptoms will go away. Get medical help right away. Call your local emergency services (911 in the [place] Do not drive yourself to the hospital.   This information is not intended to replace advice given to you by your health care provider. Make sure you discuss any questions you have with your health care provider.   Document Released: 12/11/2007 Document Revised: 11/22/2014 Document Reviewed: 06/28/2014 Elsevier Interactive Patient Education 2016 [employer].  Aspirin and Your Heart  Aspirin is a medicine that affects the way blood clots. Aspirin can be used to help reduce the risk of blood clots, heart attacks,  and other heart-related problems.  SHOULD I TAKE ASPIRIN? Your health care provider will help you determine whether it is safe and beneficial for you to take aspirin daily. Taking aspirin daily may be beneficial if you:  Have had a heart attack or chest pain.  Have undergone open heart surgery such as coronary artery bypass surgery (CABG).  Have had coronary angioplasty.  Have experienced a stroke or transient ischemic attack (TIA).  Have peripheral vascular disease (PVD).  Have chronic heart rhythm problems such as atrial fibrillation. ARE THERE ANY RISKS OF TAKING ASPIRIN DAILY? Daily use of aspirin can increase your risk of side effects. Some of these include:  Bleeding. Bleeding problems can be minor or serious. An example of a minor problem is a cut that does not stop bleeding. An example of a more serious problem is stomach bleeding or bleeding into the brain. Your risk of bleeding is increased if you are also taking non-steroidal anti-inflammatory medicine (NSAIDs).  Increased bruising.  Upset stomach.  An allergic reaction. People who have nasal polyps have an increased risk of developing an aspirin allergy. WHAT ARE SOME GUIDELINES I SHOULD FOLLOW WHEN TAKING ASPIRIN?   Take aspirin only as directed by your health care provider. Make sure you understand how much you should take and what form you should take. The two forms of aspirin are:  Non-enteric-coated. This type of aspirin does not have a coating and is absorbed quickly. Non-enteric-coated aspirin is usually recommended for people with chest pain. This type of aspirin also comes in a chewable form.  Enteric-coated. This type of aspirin has a special coating that releases the medicine very slowly. Enteric-coated aspirin causes less stomach upset than non-enteric-coated aspirin. This type of aspirin should not be chewed or crushed.  Drink alcohol in moderation. Drinking alcohol increases your risk of bleeding. WHEN  SHOULD I SEEK MEDICAL CARE?   You have unusual bleeding or bruising.  You have stomach pain.  You have an allergic reaction. Symptoms of an allergic reaction include:  Hives.  Itchy skin.  Swelling of the lips, tongue, or face.  You have ringing in your ears. WHEN SHOULD I SEEK IMMEDIATE MEDICAL CARE?   Your bowel movements are bloody, dark red, or black in color.  You vomit or cough up blood.  You have blood in your urine.  You cough, wheeze, or feel short of breath. If you have any of the following symptoms, this is an emergency. Do not wait to see if the pain will go away. Get medical help at once. Call your local emergency services (911 in the [place] Do not drive yourself to the hospital.  You have severe chest pain, especially if the pain is crushing or pressure-like and spreads to the arms, back, neck, or jaw.  You have stroke-like symptoms, such as:  Loss of vision.   Difficulty talking.   Numbness or weakness on one side of your body.   Numbness or weakness in your arm or leg.   Not thinking clearly or feeling confused.    This information is not intended to replace advice given to you by your health care provider. Make sure you discuss any questions you have with your health care provider.  Stroke Prevention  Some medical conditions and behaviors are associated with an increased chance of having a stroke. You may prevent a stroke by making healthy choices and managing medical conditions.  HOW CAN I REDUCE MY RISK OF HAVING A STROKE?  Stay physically active. Get at least 30 minutes of activity on most or all days.  Do not smoke. It may also be helpful to avoid exposure to secondhand smoke.  Limit alcohol use. Moderate alcohol use is considered to be:  No more than 2 drinks per day for men.  No more than 1 drink per day for nonpregnant women. Eat healthy foods. This involves:  Eating 5 or more servings of fruits and vegetables a day.  Making dietary  changes that address high blood pressure (hypertension), high cholesterol, diabetes, or obesity. Manage your cholesterol levels.  Making food choices that are high in fiber and low in saturated fat, trans fat, and cholesterol may control cholesterol levels.  Take any prescribed medicines to control cholesterol as directed by your health care provider. Manage your diabetes.  Controlling your carbohydrate and sugar intake is recommended to manage diabetes.  Take any prescribed medicines to control diabetes as directed by your health care provider. Control your hypertension.  Making food choices that are low in salt (sodium), saturated fat, trans fat, and cholesterol is recommended to manage hypertension.  Ask your health care provider if you need treatment to lower your blood pressure. Take any prescribed medicines to control hypertension as directed by your health care provider.  If you are 13-7 years of age, have your blood pressure checked every 3-5 years. If you are 97 years of age or older, have your blood pressure checked every year. Maintain a healthy weight.  Reducing calorie intake and making food choices that are low in sodium, saturated fat, trans fat, and cholesterol are recommended to manage weight. Stop drug abuse.  Avoid taking birth control pills.  Talk to your health care provider about the risks of taking birth control pills if you are over 16 years old, smoke, get migraines, or have ever had a blood clot. Get evaluated for sleep disorders (sleep apnea).  Talk to your health care provider about getting a sleep evaluation if you snore a lot or have excessive sleepiness. Take medicines only as directed by your health care provider.  For some people, aspirin or blood thinners (anticoagulants) are helpful in reducing the risk of forming abnormal blood clots that can lead to stroke. If you have the irregular heart rhythm of atrial fibrillation, you should be on a blood thinner unless  there is a good reason you cannot take them.  Understand all your medicine instructions. Make sure that other conditions (such as anemia or atherosclerosis) are addressed. SEEK IMMEDIATE MEDICAL CARE IF:  You have sudden weakness or numbness of the face, arm, or leg, especially on one side of the body.  Your face or eyelid droops to one side.  You have sudden confusion.  You have trouble speaking (aphasia) or understanding.  You have sudden trouble seeing in one or both eyes.  You have sudden trouble walking.  You have dizziness.  You have a loss of balance or coordination.  You have a sudden, severe headache with no known cause.  You have new chest pain or an irregular heartbeat. Any of these symptoms may represent a serious problem that is an emergency. Do not wait to see if the symptoms will go away. Get medical help at once. Call your local emergency services (911 in [place] Do not drive yourself to the hospital.  This information is not intended to replace advice given to you by your health care provider. Make sure you discuss any questions you have with your health care provider.  Document Released: 04/10/2004 Document Revised: 03/24/2014 Document Reviewed: 09/03/2012  Elsevier Interactive Patient Education 2016 [place] Released: 02/14/2008 Document Revised: 03/24/2014 Document Reviewed: 06/08/2013 Elsevier Interactive Patient Education [employer].

## 2015-04-26 NOTE — Progress Notes (Signed)
  Echocardiogram 2D Echocardiogram has been performed.  [name] 04/26/2015, 3:41 PM

## 2015-04-26 NOTE — Progress Notes (Signed)
Subjective: [name] is doing well this morning and is waiting for cardioversion.  He is having a lot of gas but not much abdominal discomfort.  Last bowel movement was Tuesday.  He has been up and moving around but has not had much to eat including nothing by mouth since last evening.  No chest pain or shortness of breath.  Objective: Vital signs in last 24 hours: Filed Vitals:   04/25/15 2022 04/26/15 0048 04/26/15 0459 04/26/15 0809  BP: 110/60 167/97 108/48 143/71  Pulse: 75 88 91 78  Temp: 97.5 F (36.4 C) 97.8 F (36.6 C) 97.8 F (36.6 C) 97.6 F (36.4 C)  TempSrc: Oral Oral Oral Oral  Resp: 18   16  Height:      Weight:   256 lb 3.2 oz (116.212 kg)   SpO2: 96% 94% 94% 96%   Weight change: -1 lb 4.8 oz (-0.59 kg)  Intake/Output Summary (Last 24 hours) at 04/26/15 0947 Last data filed at 04/25/15 2345  Gross per 24 hour  Intake    720 ml  Output      0 ml  Net    720 ml   General: lying flat in bed, no distress HEENT: EOMI, no scleral icterus Cardiac: irregular rhythm, no rubs, murmurs or gallops Pulm: normal work of breathing, moving normal volumes of air Abd: soft, nontender, nondistended, BS present Ext: warm and well perfused, no pedal edema Neuro: alert and oriented, no focal deficits  Lab Results: Basic Metabolic Panel:  Recent Labs Lab 04/24/15 1911 04/25/15 0218  NA 140 142  K 4.6 4.2  CL 102 103  CO2 28 28  GLUCOSE 113* 107*  BUN 18 15  CREATININE 1.25* 1.06  CALCIUM 9.4 9.1  MG  --  1.9  PHOS  --  3.5   Liver Function Tests:  Recent Labs Lab 04/25/15 0218  AST 27  ALT 35  ALKPHOS 52  BILITOT 1.3*  PROT 6.6  ALBUMIN 3.7   No results for input(s): LIPASE, AMYLASE in the last 168 hours. No results for input(s): AMMONIA in the last 168 hours. CBC:  Recent Labs Lab 04/25/15 0218 04/26/15 0030  WBC 7.4 5.7  HGB 12.7* 12.1*  HCT 39.2 36.4*  MCV 88.3 89.9  PLT 151 124*   Cardiac Enzymes:  Recent Labs Lab 04/24/15 2210  04/25/15 0218 04/25/15 0740  TROPONINI <0.03 <0.03 <0.03   BNP: No results for input(s): PROBNP in the last 168 hours. D-Dimer: No results for input(s): DDIMER in the last 168 hours. CBG: No results for input(s): GLUCAP in the last 168 hours. Hemoglobin A1C: No results for input(s): HGBA1C in the last 168 hours. Fasting Lipid Panel: No results for input(s): CHOL, HDL, LDLCALC, TRIG, CHOLHDL, LDLDIRECT in the last 168 hours. Thyroid Function Tests:  Recent Labs Lab 04/25/15 0218  TSH 3.297   Coagulation:  Recent Labs Lab 04/20/15 0844 04/24/15 1911 04/25/15 0218 04/26/15 0030  LABPROT  --  25.1* 24.4* 24.0*  INR 2.7 2.30* 2.22* 2.17*   Anemia Panel:  Recent Labs Lab 04/25/15 0218  VITAMINB12 940*   Urine Drug Screen: Drugs of Abuse     Component Value Date/Time   LABOPIA NONE DETECTED 08/10/2013 1210   COCAINSCRNUR NONE DETECTED 08/10/2013 1210   LABBENZ NONE DETECTED 08/10/2013 1210   AMPHETMU NONE DETECTED 08/10/2013 1210   THCU NONE DETECTED 08/10/2013 1210   LABBARB NONE DETECTED 08/10/2013 1210    Alcohol Level: No results for input(s): ETH in  the last 168 hours. Urinalysis: No results for input(s): COLORURINE, LABSPEC, PHURINE, GLUCOSEU, HGBUR, BILIRUBINUR, KETONESUR, PROTEINUR, UROBILINOGEN, NITRITE, LEUKOCYTESUR in the last 168 hours.  Invalid input(s): APPERANCEUR Misc. Labs:   Micro Results: Recent Results (from the past 240 hour(s))  MRSA PCR Screening     Status: None   Collection Time: 04/25/15  2:00 AM  Result Value Ref Range Status   MRSA by PCR NEGATIVE NEGATIVE Final    Comment:        The GeneXpert MRSA Assay (FDA approved for NASAL specimens only), is one component of a comprehensive MRSA colonization surveillance program. It is not intended to diagnose MRSA infection nor to guide or monitor treatment for MRSA infections.    Studies/Results: Dg Chest 2 View  04/24/2015  CLINICAL DATA:  Epigastric pain and umbilical pain  that began today. No shortness of breath. EXAM: CHEST  2 VIEW COMPARISON:  11/25/2014. FINDINGS: Cardiomegaly. Prior CABG. Chronic RIGHT hemidiaphragm elevation. No active infiltrates or failure. No effusion or pneumothorax. Bones unremarkable. IMPRESSION: Cardiomegaly.  No active disease. Electronically Signed   By: Staci Righter M.D.   On: 04/24/2015 19:34   Medications: I have reviewed the patient's current medications. Scheduled Meds: . allopurinol  200 mg Oral Daily  . atorvastatin  80 mg Oral Daily  . benazepril  10 mg Oral Daily  . diltiazem  180 mg Oral Daily  . fluticasone  2 spray Each Nare Daily  . levothyroxine  112 mcg Oral QAC breakfast  . pantoprazole  40 mg Oral Daily  . polyethylene glycol  17 g Oral Daily  . senna-docusate  1 tablet Oral BID  . traMADol  50 mg Oral 4 times per day  . warfarin  2.5 mg Oral q1800  . 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  Currently rate controlled on home diltiazem dosing.  Unclear how long he has been in atrial fibrillation.  Currently asymptomatic. On Coumadin at home with therapeutic INR.  Evaluated yesterday by cardiology who will plan for cardioversion -  appreciate cardiology assistance - cardioversion today depending on cardiology ability to get done - continue diltiazem 180mg  daily - goal maintain HR <110  Constipation: History of up to 6-7 days without BM. Hard straining with passage of stool day before admission. Maybe causing vasovagal contribution to his symptoms. Uses stool softener at home with minimal improvement.  No bowel movement since admission but has not had much to eat - continue Senokot-S BID - continue Miralax daily - hold off on escalating bowel regimen until after cardioversion if needed  Left Spastic Hemiparesis s/p Remote CVA: Neuro exam seems stable compared to previously documented. Strength is good and sensation  to light touch intact despite complaint of numb sensation at admission. Recent botox injections by Dr. Letta Pate still controlling pain until this event. -Tramadol ER 200mg  -Tramadol 50mg  q6hrs PRN -PT eval recommending outpatient PT  CAD s/p CABG -Continue home Plavix 75mg  -Continue Atorvastatin 80mg  daily  HTN -Continue home Lotensin 10mg  -Diltiazem as above  Diet: NPO for cardioversion  DVT PPx: Coumadin  FULL CODE  Dispo: Disposition is deferred at this time, awaiting improvement of current medical problems.  Anticipated discharge in approximately 0-1 day(s).   The patient does have a current PCP Debbrah Alar, NP) and does not need an [facility] hospital follow-up appointment after discharge.  The patient does not have transportation limitations that hinder transportation to clinic appointments.  .Services Needed at time of discharge: Y = Yes, Blank = No PT:  outpatient   OT:   RN:   Equipment:   Other:     LOS: 2 days   Jule Ser, DO 04/26/2015, 9:47 AM

## 2015-04-26 NOTE — H&P (View-Only) (Signed)
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 Admitted 02/07 w/ chest pain and afib. Ez neg, no ischemic wkup planned, for DCCV 02/09  SUBJECTIVE: No chest pain or SOB. No palpitations. Hopes to get DCCV today.  OBJECTIVE Filed Vitals:   04/25/15 2022 04/26/15 0048 04/26/15 0459 04/26/15 0809  BP: 110/60 167/97 108/48 143/71  Pulse: 75 88 91 78  Temp: 97.5 F (36.4 C) 97.8 F (36.6 C) 97.8 F (36.6 C) 97.6 F (36.4 C)  TempSrc: Oral Oral Oral Oral  Resp: 18   16  Height:      Weight:   256 lb 3.2 oz (116.212 kg)   SpO2: 96% 94% 94% 96%    Intake/Output Summary (Last 24 hours) at 04/26/15 1105 Last data filed at 04/25/15 2345  Gross per 24 hour  Intake    720 ml  Output      0 ml  Net    720 ml   Filed Weights   04/25/15 0200 04/26/15 0459  Weight: 257 lb 8 oz (116.801 kg) 256 lb 3.2 oz (116.212 kg)    PHYSICAL EXAM General: Well developed, well nourished, male in no acute distress. Head: Normocephalic, atraumatic.  Neck: Supple without bruits, JVD not elevated. Lungs:  Resp regular and unlabored, CTA. Heart: Irreg Irreg, S1, S2, no S3, S4, ?soft murmur; no rub. Abdomen: Soft, non-tender, non-distended, BS + x 4.  Extremities: No clubbing, cyanosis, edema.  Neuro: Alert and  oriented X 3. Moves all extremities spontaneously. Residual defects from CVA w/ R weakness and slurred speech Psych: Normal affect.   LABS: CBC: Recent Labs  04/25/15 0218 04/26/15 0030  WBC 7.4 5.7  HGB 12.7* 12.1*  HCT 39.2 36.4*  MCV 88.3 89.9  PLT 151 124*   INR:  INR  Date/Time Value Ref Range Status  04/26/2015 12:30 AM 2.17* 0.00 - 1.49 Final  04/25/2015 02:18 AM 2.22* 0.00 - 1.49 Final  04/24/2015 07:11 PM 2.30* 0.00 - 1.49 Final  04/20/2015 08:44 AM 2.7  Final  03/23/2015 08:47 AM 3.2  Final  02/13/2015 08:28 AM 3.1  Final   Basic Metabolic Panel: Recent Labs  04/24/15 1911 04/25/15 0218  NA 140 142  K 4.6 4.2  CL 102 103  CO2 28 28  GLUCOSE 113* 107*  BUN 18 15  CREATININE 1.25* 1.06  CALCIUM 9.4 9.1  MG  --  1.9  PHOS  --  3.5   Liver Function Tests: Recent Labs  04/25/15 0218  AST 27  ALT 35  ALKPHOS 52  BILITOT 1.3*  PROT 6.6  ALBUMIN 3.7   Cardiac Enzymes: Recent Labs  04/24/15 2210 04/25/15 0218 04/25/15 0740  TROPONINI <0.03 <0.03 <0.03  Thyroid Function Tests: Recent Labs  04/25/15 0218  TSH 3.297   Anemia Panel: Recent Labs  04/25/15 0218  VITAMINB12 940*    TELE:  Atrial fib, no pauses over 2.25 sec, RVR at times. No HR sustained < 45. 5 bt run NSVT      Radiology/Studies: Dg Chest 2 View 04/24/2015  CLINICAL DATA:  Epigastric pain and umbilical pain that began today. No shortness of breath. EXAM: CHEST  2 VIEW COMPARISON:  11/25/2014. FINDINGS: Cardiomegaly. Prior CABG. Chronic RIGHT hemidiaphragm elevation. No active infiltrates or failure. No effusion or pneumothorax. Bones unremarkable. IMPRESSION: Cardiomegaly.  No active disease. Electronically Signed   By: Staci Righter M.D.   On: 04/24/2015 19:34     Current Medications:  . allopurinol  200 mg Oral Daily  . atorvastatin  80 mg Oral Daily  . benazepril  10 mg Oral Daily  . diltiazem  180 mg Oral Daily  . fluticasone  2 spray Each Nare Daily  .  levothyroxine  112 mcg Oral QAC breakfast  . pantoprazole  40 mg Oral Daily  . polyethylene glycol  17 g Oral Daily  . senna-docusate  1 tablet Oral BID  . traMADol  50 mg Oral 4 times per day  . warfarin  2.5 mg Oral q1800  . Warfarin - Pharmacist Dosing Inpatient   Does not apply q1800      ASSESSMENT AND PLAN: Principal Problem:   Chest pain - no ischemic testing at this time - sx have resolved    Atrial fibrillation with RVR (HCC) - Rate control is good w/ Dilt 180 mg, no dose change w/ pauses and some low HR - NPO for DCCV today    Long term (current) use of anticoagulants - INR is therapeutic and has been - see table above    Diastolic CHF -EF XX123456 with grade 2 DD 2014 - weight is at/below baseline - no SOB or new DOE    CAD S/P CFX DES 2011 after CABG 2004 - no recent ischemic sx - ez neg MI - MV low risk 2013 - will arrange f/u w/ Dr Aundra Dubin.  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 Agree with above findings and plan. Still has a mild discomfort feeling in his chest. Remains in Afib with a controlled rate. Thinks this was triggered by straining at stool. Last AFib episode 2 yrs ago. He has ruled our for MI. Plan DCCV today. No ischemic work up planned this admit.   Davin Archuletta Martinique, [place] 04/26/2015 11:31 AM

## 2015-04-26 NOTE — Transfer of Care (Signed)
Immediate Anesthesia Transfer of Care Note  Patient: [name]  Procedure(s) Performed: Procedure(s): CARDIOVERSION (N/A)  Patient Location: PACU and Endoscopy Unit  Anesthesia Type:MAC  Level of Consciousness: awake, oriented and sedated  Airway & Oxygen Therapy: Patient Spontanous Breathing and Patient connected to nasal cannula oxygen  Post-op Assessment: Report given to RN, Post -op Vital signs reviewed and stable and Patient moving all extremities  Post vital signs: Reviewed and stable  Last Vitals:  Filed Vitals:   04/26/15 1410 04/26/15 1413  BP:  135/69  Pulse: 62 71  Temp:    Resp: 17 13    Complications: No apparent anesthesia complications

## 2015-04-26 NOTE — Progress Notes (Signed)
ANTICOAGULATION CONSULT NOTE - Follow Up Consult  Pharmacy Consult for Warfarin Indication: atrial fibrillation  No Known Allergies  Patient Measurements: Height: 6\' 3"  (190.5 cm) Weight: 256 lb 3.2 oz (116.212 kg) IBW/kg (Calculated) : 84.5  Vital Signs: Temp: 97.6 F (36.4 C) (02/09 0809) Temp Source: Oral (02/09 0809) BP: 143/71 mmHg (02/09 0809) Pulse Rate: 78 (02/09 0809)  Labs:  Recent Labs  04/24/15 1911  04/24/15 2210 04/25/15 0218 04/25/15 0740 04/26/15 0030  HGB 13.2  --   --  12.7*  --  12.1*  HCT 41.2  --   --  39.2  --  36.4*  PLT 146*  --   --  151  --  124*  LABPROT 25.1*  --   --  24.4*  --  24.0*  INR 2.30*  --   --  2.22*  --  2.17*  CREATININE 1.25*  --   --  1.06  --   --   TROPONINI  --   < > <0.03 <0.03 <0.03  --   < > = values in this interval not displayed.  Estimated Creatinine Clearance: 87.9 mL/min (by C-G formula based on Cr of 1.06).   Medical History: Past Medical History  Diagnosis Date  . GERD (gastroesophageal reflux disease)   . Gout   . Hyperlipidemia   . Hypertension   . CVA (cerebral infarction)     left hemiparesis  . Chronic pain     left sided-[place]  . CAD (coronary artery disease)     s/p CABG, s/p DES to LCX  January 2011  . OSA on CPAP   . Sleep apnea   . History of colonoscopy   . Stroke ([place])   . Dysrhythmia     hx of atrial fibrilation with cardioversion  . Hypothyroidism   . Diastolic CHF (HCC)     Medications:  See electronic med rec  Assessment: 70 y.o. M presented with CP - noted to be in afib with RVR. Pt on coumadin PTA for afib. Home dose 2.5mg  daily. INR therapeutic at 2.17. CBC ok at baseline. Hgb 12.1, Plt down to 124   Goal of Therapy:  INR 2-3 Monitor platelets by anticoagulation protocol: Yes   Plan:  Coumadin 2.5mg  po daily Daily INR  Albertina Parr, PharmD., BCPS Clinical Pharmacist Pager [phone]

## 2015-04-26 NOTE — Interval H&P Note (Signed)
History and Physical Interval Note:  04/26/2015 2:17 PM  [name]  has presented today for surgery, with the diagnosis of atrial fib  The various methods of treatment have been discussed with the patient and family. After consideration of risks, benefits and other options for treatment, the patient has consented to  Procedure(s): CARDIOVERSION (N/A) as a surgical intervention .  The patient's history has been reviewed, patient examined, no change in status, stable for surgery.  I have reviewed the patient's chart and labs.  Questions were answered to the patient's satisfaction.     Dorothy Spark

## 2015-04-26 NOTE — Progress Notes (Signed)
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 Admitted 02/07 w/ chest pain and afib. Ez neg, no ischemic wkup planned, for DCCV 02/09  SUBJECTIVE: No chest pain or SOB. No palpitations. Hopes to get DCCV today.  OBJECTIVE Filed Vitals:   04/25/15 2022 04/26/15 0048 04/26/15 0459 04/26/15 0809  BP: 110/60 167/97 108/48 143/71  Pulse: 75 88 91 78  Temp: 97.5 F (36.4 C) 97.8 F (36.6 C) 97.8 F (36.6 C) 97.6 F (36.4 C)  TempSrc: Oral Oral Oral Oral  Resp: 18   16  Height:      Weight:   256 lb 3.2 oz (116.212 kg)   SpO2: 96% 94% 94% 96%    Intake/Output Summary (Last 24 hours) at 04/26/15 1105 Last data filed at 04/25/15 2345  Gross per 24 hour  Intake    720 ml  Output      0 ml  Net    720 ml   Filed Weights   04/25/15 0200 04/26/15 0459  Weight: 257 lb 8 oz (116.801 kg) 256 lb 3.2 oz (116.212 kg)    PHYSICAL EXAM General: Well developed, well nourished, male in no acute distress. Head: Normocephalic, atraumatic.  Neck: Supple without bruits, JVD not elevated. Lungs:  Resp regular and unlabored, CTA. Heart: Irreg Irreg, S1, S2, no S3, S4, ?soft murmur; no rub. Abdomen: Soft, non-tender, non-distended, BS + x 4.  Extremities: No clubbing, cyanosis, edema.  Neuro: Alert and  oriented X 3. Moves all extremities spontaneously. Residual defects from CVA w/ R weakness and slurred speech Psych: Normal affect.   LABS: CBC: Recent Labs  04/25/15 0218 04/26/15 0030  WBC 7.4 5.7  HGB 12.7* 12.1*  HCT 39.2 36.4*  MCV 88.3 89.9  PLT 151 124*   INR:  INR  Date/Time Value Ref Range Status  04/26/2015 12:30 AM 2.17* 0.00 - 1.49 Final  04/25/2015 02:18 AM 2.22* 0.00 - 1.49 Final  04/24/2015 07:11 PM 2.30* 0.00 - 1.49 Final  04/20/2015 08:44 AM 2.7  Final  03/23/2015 08:47 AM 3.2  Final  02/13/2015 08:28 AM 3.1  Final   Basic Metabolic Panel: Recent Labs  04/24/15 1911 04/25/15 0218  NA 140 142  K 4.6 4.2  CL 102 103  CO2 28 28  GLUCOSE 113* 107*  BUN 18 15  CREATININE 1.25* 1.06  CALCIUM 9.4 9.1  MG  --  1.9  PHOS  --  3.5   Liver Function Tests: Recent Labs  04/25/15 0218  AST 27  ALT 35  ALKPHOS 52  BILITOT 1.3*  PROT 6.6  ALBUMIN 3.7   Cardiac Enzymes: Recent Labs  04/24/15 2210 04/25/15 0218 04/25/15 0740  TROPONINI <0.03 <0.03 <0.03  Thyroid Function Tests: Recent Labs  04/25/15 0218  TSH 3.297   Anemia Panel: Recent Labs  04/25/15 0218  VITAMINB12 940*    TELE:  Atrial fib, no pauses over 2.25 sec, RVR at times. No HR sustained < 45. 5 bt run NSVT      Radiology/Studies: Dg Chest 2 View 04/24/2015  CLINICAL DATA:  Epigastric pain and umbilical pain that began today. No shortness of breath. EXAM: CHEST  2 VIEW COMPARISON:  11/25/2014. FINDINGS: Cardiomegaly. Prior CABG. Chronic RIGHT hemidiaphragm elevation. No active infiltrates or failure. No effusion or pneumothorax. Bones unremarkable. IMPRESSION: Cardiomegaly.  No active disease. Electronically Signed   By: Staci Righter M.D.   On: 04/24/2015 19:34     Current Medications:  . allopurinol  200 mg Oral Daily  . atorvastatin  80 mg Oral Daily  . benazepril  10 mg Oral Daily  . diltiazem  180 mg Oral Daily  . fluticasone  2 spray Each Nare Daily  .  levothyroxine  112 mcg Oral QAC breakfast  . pantoprazole  40 mg Oral Daily  . polyethylene glycol  17 g Oral Daily  . senna-docusate  1 tablet Oral BID  . traMADol  50 mg Oral 4 times per day  . warfarin  2.5 mg Oral q1800  . Warfarin - Pharmacist Dosing Inpatient   Does not apply q1800      ASSESSMENT AND PLAN: Principal Problem:   Chest pain - no ischemic testing at this time - sx have resolved    Atrial fibrillation with RVR (HCC) - Rate control is good w/ Dilt 180 mg, no dose change w/ pauses and some low HR - NPO for DCCV today    Long term (current) use of anticoagulants - INR is therapeutic and has been - see table above    Diastolic CHF -EF XX123456 with grade 2 DD 2014 - weight is at/below baseline - no SOB or new DOE    CAD S/P CFX DES 2011 after CABG 2004 - no recent ischemic sx - ez neg MI - MV low risk 2013 - will arrange f/u w/ Dr Aundra Dubin.  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 Agree with above findings and plan. Still has a mild discomfort feeling in his chest. Remains in Afib with a controlled rate. Thinks this was triggered by straining at stool. Last AFib episode 2 yrs ago. He has ruled our for MI. Plan DCCV today. No ischemic work up planned this admit.   Peter Martinique, [place] 04/26/2015 11:31 AM

## 2015-04-27 ENCOUNTER — Encounter (HOSPITAL_COMMUNITY): Payer: Self-pay | Admitting: Cardiology

## 2015-04-27 DIAGNOSIS — Z9861 Coronary angioplasty status: Secondary | ICD-10-CM | POA: Diagnosis not present

## 2015-04-27 DIAGNOSIS — R0789 Other chest pain: Secondary | ICD-10-CM | POA: Diagnosis not present

## 2015-04-27 DIAGNOSIS — R079 Chest pain, unspecified: Secondary | ICD-10-CM

## 2015-04-27 DIAGNOSIS — I2510 Atherosclerotic heart disease of native coronary artery without angina pectoris: Secondary | ICD-10-CM | POA: Diagnosis not present

## 2015-04-27 DIAGNOSIS — K5900 Constipation, unspecified: Secondary | ICD-10-CM | POA: Diagnosis not present

## 2015-04-27 DIAGNOSIS — Z955 Presence of coronary angioplasty implant and graft: Secondary | ICD-10-CM

## 2015-04-27 DIAGNOSIS — I4891 Unspecified atrial fibrillation: Secondary | ICD-10-CM | POA: Diagnosis not present

## 2015-04-27 LAB — PROTIME-INR
INR: 2.13 — ABNORMAL HIGH (ref 0.00–1.49)
Prothrombin Time: 23.7 seconds — ABNORMAL HIGH (ref 11.6–15.2)

## 2015-04-27 MED ORDER — POLYETHYLENE GLYCOL 3350 17 G PO PACK: 17.0000 g | PACK | Freq: Every day | ORAL | Status: DC | PRN

## 2015-04-27 MED ORDER — DOCUSATE SODIUM 100 MG PO CAPS: 100.0000 mg | ORAL_CAPSULE | Freq: Two times a day (BID) | ORAL | Status: DC

## 2015-04-27 MED ORDER — MAGNESIUM CITRATE PO SOLN
1.0000 | Freq: Once | ORAL | Status: AC
  Administered 2015-04-27: 1 via ORAL
  Filled 2015-04-27: qty 296

## 2015-04-27 NOTE — Discharge Summary (Signed)
Name: [name] MRN: [id number] DOB: 20-May-1945 70 y.o. PCP: Debbrah Alar, NP  Date of Admission: 04/24/2015  6:40 PM Date of Discharge: 04/27/2015 Attending Physician: Thayer Headings, MD  Discharge Diagnosis: 1. Atrial Fibrillation with RVR s/p DCCV 2. Constipation 3. Chest Pain 4. 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 D/C PREVIOUS SCRIPTS FOR THIS MEDICATION     atorvastatin 40 MG tablet  Commonly known as:  LIPITOR  Take 2 tablets (80 mg total) by mouth daily.     benazepril 10 MG tablet  Commonly known as:  LOTENSIN  TAKE 1 TABLET BY MOUTH DAILY D/C PREVIOUS SCRIPTS FOR THIS MEDICATION     cyanocobalamin 1000 MCG tablet  Take 1 tablet (1,000 mcg total) by mouth daily.     diltiazem 180 MG 24 hr capsule  Commonly known as:  CARDIZEM CD  TAKE 1 CAPSULE EVERY DAY     docusate sodium 100 MG capsule  Commonly known as:  COLACE  Take 1 capsule (100 mg total) by mouth 2 (two) times daily.     fluticasone 50 MCG/ACT nasal spray  Commonly known as:  FLONASE  Place 2 sprays into both nostrils daily.     furosemide 40 MG tablet  Commonly known as:  LASIX  TAKE 1 TABLET BY MOUTH EVERY OTHER DAY     levothyroxine 112 MCG tablet  Commonly  known as:  SYNTHROID, LEVOTHROID  TAKE 1 TABLET (112 MCG TOTAL) BY MOUTH DAILY BEFORE BREAKFAST.     metFORMIN 500 MG tablet  Commonly known as:  GLUCOPHAGE  TAKE 1 TABLET EVERY DAY WITH BREAKFAST     MULTIVITAMIN GUMMIES MENS PO  Take 1 tablet by mouth daily.     nitroGLYCERIN 0.4 MG SL tablet  Commonly known as:  NITROSTAT  Place 1 tablet (0.4 mg total) under the tongue every 5 (five) minutes as needed for chest pain. Up to 3 doses     pantoprazole 40 MG tablet  Commonly known as:  PROTONIX  Take 1 tablet (40 mg total) by mouth daily.     polyethylene glycol packet  Commonly known as:  MIRALAX / GLYCOLAX  Take 17 g by mouth daily as needed.     traMADol 50 MG tablet  Commonly known as:  ULTRAM  Take 1 tablet (50 mg total) by mouth every 6 (six) hours as needed.     traMADol 200 MG 24 hr tablet  Commonly known as:  ULTRAM-ER  TAKE 1 TABLET EVERY DAY WITH AM DOSE OF TRAMADOL 50 MG     warfarin 2.5 MG tablet  Commonly known as:  COUMADIN  Take 2.5 mg by  mouth daily.        Disposition and follow-up:   [name] was discharged from [facility] in Stable condition.    1.  At the hospital follow up visit please address:  - anymore episodes of a-fib recurrence - has he followed up with cardiology - has he followed up with outpatient PT - constipation better?  2.  Labs / imaging needed at time of follow-up: none  3.  Pending labs/ test needing follow-up: none  Follow-up Appointments:     Follow-up Information    Follow up with [facility]   Specialty:  Rehabilitation   Why:  Office to call with an appointment time. If Office has not called within 2-3- business days please call them.    Contact information:   [address] [id number] [place] [phone]      Follow up with [name]., NP. Schedule an appointment as soon as possible for a visit in 1  week.   Specialty:  Internal Medicine   Contact information:   [address] [place] [address] [phone]       Discharge Instructions: Discharge Instructions    Ambulatory referral to Physical Therapy    Complete by:  As directed      Diet - low sodium heart healthy    Complete by:  As directed      Increase activity slowly    Complete by:  As directed            Consultations: Treatment Team:  Rounding Lbcardiology, MD  Procedures Performed:  Dg Chest 2 View  04/24/2015  CLINICAL DATA:  Epigastric pain and umbilical pain that began today. No shortness of breath. EXAM: CHEST  2 VIEW COMPARISON:  11/25/2014. FINDINGS: Cardiomegaly. Prior CABG. Chronic RIGHT hemidiaphragm elevation. No active infiltrates or failure. No effusion or pneumothorax. Bones unremarkable. IMPRESSION: Cardiomegaly.  No active disease. Electronically Signed   By: Staci Righter M.D.   On: 04/24/2015 19:34    2D Echo:  - Left ventricle: The cavity size was normal. There was mild concentric hypertrophy. Systolic function was normal. The estimated ejection fraction was in the range of 50% to 55%. Wall motion was normal; there were no regional wall motion abnormalities. Left ventricular diastolic function parameters were normal. - Aortic valve: Trileaflet; mildly thickened, mildly calcified leaflets. - Left atrium: The atrium was mildly dilated. - Tricuspid valve: There was moderate regurgitation.  Cardiac Cath: none  Admission HPI:  70 y/o man with PMHx of CVA with residual L spastic hemiparesis, CAD s/p CABG 2004, PAF on coumadin, HTN, HLD, GERD presents to the ED after starting to feel chest pressure and fatigue this morning. He was feeling well and drove to the store and back to buy and install a circuit breaker with his son in law around 10:30am after which time he first noticed a heaviness in his left arm and leg. He does not feel this entailed particular physical  exertion. He then felt very drained and unable to walk more than 25-47ft continuously due to this weakness. After this he developed chest tightness and came to the ED for concern about his symptoms. After arrival he was noted to be in Afib with RVR with HR up to 130s resting in bed and given bolus then infusion of cardizem. No EKG changes concerning for active ischemia, initial troponins negative x2. Symptoms improved but did not fully resolve and continued Afib  with tachycardia and admitted for full evaluation.  He denies dyspnea, diaphoresis, nausea, headache during this time. He does remark on severe constipation at home with no bowel movement for 6-7 days until 2 days ago, then once daily with very large hard stools requiring intense straining to pass. He also reports an extremely high stress level for the past 2 weeks due to financial difficulties, repair of a septic tank, repair of home appliances. His only recent change in medications was starting Osteo Bi-flex OTC. He was otherwise feeling in good health prior to these events.  Baseline function is ambulatory without aids at home. He receives botox injections and management of his L sided weakness and spasticity with PMR Dr. Read Drivers. Per daughter has short term memory deficits that do not impair ADLs. Last TTE in 0000000 grade 2 diastolic dysfunction with 60-65% LVEF, last stress study in 10/2011.  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 Currently rate controlled on home diltiazem dosing. Status post cardioversion 04/26/2015 and maintaining normal sinus rhythm.  Coumadin continued. - appreciate cardiology assistance - if atrial fibrillation recurs, will need consideration of anti-arrythmic drug - continue diltiazem 180mg  daily - goal maintain HR <110  Chest Pain: secondary to a-fib and resolved. No ischemic work-up needed this admission. Follow up outpatient with primary cardiologist.  Constipation: History of up to 6-7 days without BM prior to admission. Hard straining with passage of stool day before admission. Uses stool softener at home with minimal improvement. Small bowel movement 04/26/2015.  Mag citrate given before discharge with resultant bowel movement x 2. - continue stool softener - continue Miralax daily as needed - consider change to beta blocker from diltiazem if constipation persists but has been on diltiazem for at least 10years  Left Spastic Hemiparesis s/p Remote CVA: Neuro exam seems stable compared to previously documented. Strength is good and sensation to light touch intact despite complaint of numb sensation at admission. Recent botox injections by Dr. Letta Pate still controlling pain until this event. -Continue home Tramadol -Outpatient PT follow up  CAD s/p DES 2011 after CABG 2004:  No ischemic work-up needed this admission.  Echo results as above. -Continue home Plavix 75mg  -Continue Atorvastatin 80mg  daily -Follow up with primary cardiologist  Discharge Vitals:   BP 147/81 mmHg  Pulse 66  Temp(Src) 97.4 F (36.3 C) (Oral)  Resp 18  Ht 6\' 3"  (1.905 m)  Wt 254 lb 8 oz (115.44 kg)  BMI 31.81 kg/m2  SpO2 95%  Discharge Labs:  Results for orders placed or performed during the hospital encounter of  04/24/15 (from the past 24 hour(s))  Protime-INR     Status: Abnormal   Collection Time: 04/27/15  5:40 AM  Result Value Ref Range  Prothrombin Time 23.7 (H) 11.6 - 15.2 seconds   INR 2.13 (H) 0.00 - 1.49    Signed: Jule Ser, DO 04/27/2015, 11:30 AM    Services Ordered on Discharge: outpatient PT Equipment Ordered on Discharge: none

## 2015-04-27 NOTE — Progress Notes (Signed)
ANTICOAGULATION CONSULT NOTE - Follow Up Consult  Pharmacy Consult for Warfarin Indication: atrial fibrillation  No Known Allergies  Patient Measurements: Height: 6\' 3"  (190.5 cm) Weight: 254 lb 8 oz (115.44 kg) IBW/kg (Calculated) : 84.5  Vital Signs: Temp: 97.4 F (36.3 C) (02/10 0300) Temp Source: Oral (02/10 0300) BP: 131/68 mmHg (02/10 0300) Pulse Rate: 66 (02/10 0300)  Labs:  Recent Labs  04/24/15 1911  04/24/15 2210 04/25/15 0218 04/25/15 0740 04/26/15 0030 04/27/15 0540  HGB 13.2  --   --  12.7*  --  12.1*  --   HCT 41.2  --   --  39.2  --  36.4*  --   PLT 146*  --   --  151  --  124*  --   LABPROT 25.1*  --   --  24.4*  --  24.0* 23.7*  INR 2.30*  --   --  2.22*  --  2.17* 2.13*  CREATININE 1.25*  --   --  1.06  --   --   --   TROPONINI  --   < > <0.03 <0.03 <0.03  --   --   < > = values in this interval not displayed.  Estimated Creatinine Clearance: 87.6 mL/min (by C-G formula based on Cr of 1.06).   Medical History: Past Medical History  Diagnosis Date  . GERD (gastroesophageal reflux disease)   . Gout   . Hyperlipidemia   . Hypertension   . CVA (cerebral infarction)     left hemiparesis  . Chronic pain     left sided-[place]  . CAD (coronary artery disease)     s/p CABG, s/p DES to LCX  January 2011  . OSA on CPAP   . Sleep apnea   . History of colonoscopy   . Stroke ([place])   . Dysrhythmia     hx of atrial fibrilation with cardioversion  . Hypothyroidism   . Diastolic CHF (HCC)     Medications:  See electronic med rec  Assessment: 70 y.o. M presented with CP - noted to be in afib with RVR. Pt on coumadin PTA for afib. Home dose 2.5mg  daily. INR therapeutic at 2.14. DCCV planned today   Goal of Therapy:  INR 2-3 Monitor platelets by anticoagulation protocol: Yes   Plan:  Coumadin 2.5mg  po daily Daily INR  Albertina Parr, PharmD., BCPS Clinical Pharmacist Pager [phone]

## 2015-04-27 NOTE — Plan of Care (Signed)
Problem: Bowel/Gastric: Goal: Will not experience complications related to bowel motility Outcome: Not Progressing Patient complaining of constipation. Very concerned about straining because he feels that this is what caused him to go into atrial fibrillation. Patient is received stool softeners and miralax twice daily along with PRN milk of magnesia. Patient has yet to be able to have a normal bowel movement. Patient is passing gas.

## 2015-04-27 NOTE — Progress Notes (Signed)
discharge teaching and instructions reviewed. Has bowel movement X2. Pt has no further questions. VSS. Discharging home via wife.

## 2015-04-27 NOTE — Plan of Care (Signed)
Problem: Safety: Goal: Ability to remain free from injury will improve Outcome: Completed/Met Date Met:  04/27/15 Patient is able to ambulate safely with a standby assist from staff. Patient's call bell is within reach; bed alarm on. Patient verbalizes need to call staff before ambulating to the bathroom.  Problem: Pain Managment: Goal: General experience of comfort will improve Outcome: Completed/Met Date Met:  04/27/15 Patient has had no complaints of acute pain. Patient's chronic pain is managed with tramadol every 6 hours.  Problem: Activity: Goal: Risk for activity intolerance will decrease Outcome: Completed/Met Date Met:  04/27/15 Patient is able to ambulate to the bathroom without difficulty. Patient is in normal sinus rhythm and he no longer has complaints of fatigue.

## 2015-04-27 NOTE — Progress Notes (Signed)
Subjective: [name] is having no chest pain or SOB.  Had successful cardioversion yesterday afternoon.  Still has some constipation and flatus, although did have a small bowel movement yesterday.  Objective: Vital signs in last 24 hours: Filed Vitals:   04/27/15 0009 04/27/15 0037 04/27/15 0300 04/27/15 1000  BP: 94/58 143/79 131/68 147/81  Pulse: 78  66 66  Temp: 98.6 F (37 C)  97.4 F (36.3 C)   TempSrc: Oral  Oral   Resp: 18  18   Height:      Weight:   254 lb 8 oz (115.44 kg)   SpO2: 97%  95%    Weight change: -1 lb 11.2 oz (-0.771 kg)  Intake/Output Summary (Last 24 hours) at 04/27/15 1116 Last data filed at 04/27/15 0800  Gross per 24 hour  Intake    840 ml  Output      0 ml  Net    840 ml   General: lying flat in bed, no distress HEENT: EOMI, no scleral icterus Cardiac: regular rate and rhythm, no rubs, murmurs or gallops Pulm: normal work of breathing, moving normal volumes of air Abd: soft, nontender, nondistended Ext: warm and well perfused, no pedal edema Neuro: alert and oriented, no focal deficits  Lab Results: Basic Metabolic Panel:  Recent Labs Lab 04/24/15 1911 04/25/15 0218  NA 140 142  K 4.6 4.2  CL 102 103  CO2 28 28  GLUCOSE 113* 107*  BUN 18 15  CREATININE 1.25* 1.06  CALCIUM 9.4 9.1  MG  --  1.9  PHOS  --  3.5   Liver Function Tests:  Recent Labs Lab 04/25/15 0218  AST 27  ALT 35  ALKPHOS 52  BILITOT 1.3*  PROT 6.6  ALBUMIN 3.7   No results for input(s): LIPASE, AMYLASE in the last 168 hours. No results for input(s): AMMONIA in the last 168 hours. CBC:  Recent Labs Lab 04/25/15 0218 04/26/15 0030  WBC 7.4 5.7  HGB 12.7* 12.1*  HCT 39.2 36.4*  MCV 88.3 89.9  PLT 151 124*   Cardiac Enzymes:  Recent Labs Lab 04/24/15 2210 04/25/15 0218 04/25/15 0740  TROPONINI <0.03 <0.03 <0.03   BNP: No results for input(s): PROBNP in the last 168 hours. D-Dimer: No results for input(s): DDIMER in the last 168  hours. CBG: No results for input(s): GLUCAP in the last 168 hours. Hemoglobin A1C: No results for input(s): HGBA1C in the last 168 hours. Fasting Lipid Panel: No results for input(s): CHOL, HDL, LDLCALC, TRIG, CHOLHDL, LDLDIRECT in the last 168 hours. Thyroid Function Tests:  Recent Labs Lab 04/25/15 0218  TSH 3.297   Coagulation:  Recent Labs Lab 04/24/15 1911 04/25/15 0218 04/26/15 0030 04/27/15 0540  LABPROT 25.1* 24.4* 24.0* 23.7*  INR 2.30* 2.22* 2.17* 2.13*   Anemia Panel:  Recent Labs Lab 04/25/15 0218  VITAMINB12 940*   Urine Drug Screen: Drugs of Abuse     Component Value Date/Time   LABOPIA NONE DETECTED 08/10/2013 1210   COCAINSCRNUR NONE DETECTED 08/10/2013 1210   LABBENZ NONE DETECTED 08/10/2013 1210   AMPHETMU NONE DETECTED 08/10/2013 1210   THCU NONE DETECTED 08/10/2013 1210   LABBARB NONE DETECTED 08/10/2013 1210    Alcohol Level: No results for input(s): ETH in the last 168 hours. Urinalysis: No results for input(s): COLORURINE, LABSPEC, PHURINE, GLUCOSEU, HGBUR, BILIRUBINUR, KETONESUR, PROTEINUR, UROBILINOGEN, NITRITE, LEUKOCYTESUR in the last 168 hours.  Invalid input(s): APPERANCEUR Misc. Labs:   Micro Results: Recent Results (from the  past 240 hour(s))  MRSA PCR Screening     Status: None   Collection Time: 04/25/15  2:00 AM  Result Value Ref Range Status   MRSA by PCR NEGATIVE NEGATIVE Final    Comment:        The GeneXpert MRSA Assay (FDA approved for NASAL specimens only), is one component of a comprehensive MRSA colonization surveillance program. It is not intended to diagnose MRSA infection nor to guide or monitor treatment for MRSA infections.    Studies/Results: No results found. Medications: I have reviewed the patient's current medications. Scheduled Meds: . allopurinol  200 mg Oral Daily  . atorvastatin  80 mg Oral Daily  . benazepril  10 mg Oral Daily  . diltiazem  180 mg Oral Daily  . docusate sodium  100  mg Oral BID  . fluticasone  2 spray Each Nare Daily  . levothyroxine  112 mcg Oral QAC breakfast  . pantoprazole  40 mg Oral Daily  . polyethylene glycol  17 g Oral BID  . senna-docusate  1 tablet Oral BID  . traMADol  50 mg Oral 4 times per day  . warfarin  2.5 mg Oral q1800  . Warfarin - Pharmacist Dosing Inpatient   Does not apply q1800   Continuous Infusions:   PRN Meds:.acetaminophen **OR** acetaminophen, magnesium hydroxide, promethazine, traMADol Assessment/Plan: Principal Problem:   Chest pain Active Problems:   Hyperlipidemia   Obstructive sleep apnea-Failed CPAP   Essential hypertension   Hx of CABG '04   History of stroke with residual deficit   Diabetes type 2, controlled ([place])   Atrial fibrillation with RVR ([place])   Long term (current) use of anticoagulants   Spastic hemiplegia affecting nondominant side (HCC)   Hypothyroidism   Constipation   Diastolic CHF -EF XX123456 with grade 2 DD 2014   CAD S/P CFX DES 2011   PAF - CAHDs VASc=7   Atrial fibrillation with rapid ventricular response (HCC)  Atrial Fibrillation with RVR: patient with history of paroxysmal atrial fibrillation who was admitted after presenting to the ED in a-fib with RVR rates up to 130s.  Currently rate controlled on home diltiazem dosing.  Status post cardioversion yesterday and maintaining normal sinus rhythm. - appreciate cardiology assistance - if atrial fibrillation recurs, will need consideration of anti-arythmic drug - continue diltiazem 180mg  daily - goal maintain HR <110 - discharge todayd  Chest Pain: secondary to a-fib and resolved.  No ischemic work-up needed this admission.  Constipation: History of up to 6-7 days without BM. Hard straining with passage of stool day before admission. Uses stool softener at home with minimal improvement.  Small bowel movement yesterday - continue stool softener - continue Miralax daily as needed - Mag citrate given before discharge - consider change  to beta blocker from diltiazem if constipation persists but has been on diltiazem for at least 10years  Left Spastic Hemiparesis s/p Remote CVA: Neuro exam seems stable compared to previously documented. Strength is good and sensation to light touch intact despite complaint of numb sensation at admission. Recent botox injections by Dr. Letta Pate still controlling pain until this event. -Tramadol ER 200mg  -Tramadol 50mg  q6hrs PRN -PT eval recommending outpatient PT  CAD s/p DEC 2011 after CABG 2004 -Continue home Plavix 75mg  -Continue Atorvastatin 80mg  daily -Follow up with primary cardiologist  HTN -Continue home Lotensin 10mg  -Diltiazem as above  Diet: Heart healthy  DVT PPx: Coumadin  FULL CODE  Dispo: home today  The patient does have a current  PCP Debbrah Alar, NP) and does not need an [facility] hospital follow-up appointment after discharge.  The patient does not have transportation limitations that hinder transportation to clinic appointments.  .Services Needed at time of discharge: Y = Yes, Blank = No PT:  outpatient   OT:   RN:   Equipment:   Other:     LOS: 3 days   Jule Ser, DO 04/27/2015, 11:16 AM

## 2015-04-27 NOTE — Progress Notes (Signed)
Patient Name: [name] Date of Encounter: 04/27/2015  Principal Problem:   Chest pain Active Problems:   Hyperlipidemia   Obstructive sleep apnea-Failed CPAP   Essential hypertension   Hx of CABG '04   History of stroke with residual deficit   Diabetes type 2, controlled (HCC)   Atrial fibrillation with RVR ([place])   Long term (current) use of anticoagulants   Spastic hemiplegia affecting nondominant side (HCC)   Hypothyroidism   Constipation   Diastolic CHF -EF XX123456 with grade 2 DD 2014   CAD S/P CFX DES 2011   PAF - CAHDs VASc=7   Atrial fibrillation with rapid ventricular response [facility])   Primary Cardiologist: Dr Aundra Dubin  Patient Profile: 70 yo male w/ hx CABG '04, SVG-RCA & SVG-OM 100% 2011, CFX DES, PAF, CVA, EF nl by echo 2014, Chronic coumadin therapeutic > 1 mo. Admitted 02/07 w/ chest pain and afib. Ez neg, no ischemic wkup planned, s/p DCCV 02/09  SUBJECTIVE: No chest pain or SOB. No palpitations.  Still has some constipation. Had small BM yesterday.  OBJECTIVE Filed Vitals:   04/26/15 2006 04/27/15 0009 04/27/15 0037 04/27/15 0300  BP: 142/76 94/58 143/79 131/68  Pulse: 78 78  66  Temp: 98.1 F (36.7 C) 98.6 F (37 C)  97.4 F (36.3 C)  TempSrc: Oral Oral  Oral  Resp: 18 18  18   Height:      Weight:    115.44 kg (254 lb 8 oz)  SpO2: 99% 97%  95%    Intake/Output Summary (Last 24 hours) at 04/27/15 1012 Last data filed at 04/27/15 0800  Gross per 24 hour  Intake    840 ml  Output      0 ml  Net    840 ml   Filed Weights   04/25/15 0200 04/26/15 0459 04/27/15 0300  Weight: 116.801 kg (257 lb 8 oz) 116.212 kg (256 lb 3.2 oz) 115.44 kg (254 lb 8 oz)    PHYSICAL EXAM General: Well developed, well nourished, male in no acute distress. Head: Normocephalic, atraumatic.  Neck: Supple without bruits, JVD not elevated. Lungs:  Resp regular and unlabored, CTA. Heart: RRR S1, S2, no S3, S4, ?soft murmur; no rub. Abdomen: Soft, non-tender,  non-distended, BS + x 4.  Extremities: No clubbing, cyanosis, edema.  Neuro: Alert and oriented X 3. Moves all extremities spontaneously. Residual defects from CVA w/ R weakness and slurred speech Psych: Normal affect.   LABS: CBC:  Recent Labs  04/25/15 0218 04/26/15 0030  WBC 7.4 5.7  HGB 12.7* 12.1*  HCT 39.2 36.4*  MCV 88.3 89.9  PLT 151 124*   INR:  INR  Date/Time Value Ref Range Status  04/26/2015 12:30 AM 2.17* 0.00 - 1.49 Final  04/25/2015 02:18 AM 2.22* 0.00 - 1.49 Final  04/24/2015 07:11 PM 2.30* 0.00 - 1.49 Final  04/20/2015 08:44 AM 2.7  Final  03/23/2015 08:47 AM 3.2  Final  02/13/2015 08:28 AM 3.1  Final   Basic Metabolic Panel:  Recent Labs  04/24/15 1911 04/25/15 0218  NA 140 142  K 4.6 4.2  CL 102 103  CO2 28 28  GLUCOSE 113* 107*  BUN 18 15  CREATININE 1.25* 1.06  CALCIUM 9.4 9.1  MG  --  1.9  PHOS  --  3.5   Liver Function Tests:  Recent Labs  04/25/15 0218  AST 27  ALT 35  ALKPHOS 52  BILITOT 1.3*  PROT 6.6  ALBUMIN 3.7  Cardiac Enzymes:  Recent Labs  04/24/15 2210 04/25/15 0218 04/25/15 0740  TROPONINI <0.03 <0.03 <0.03   Thyroid Function Tests:  Recent Labs  04/25/15 0218  TSH 3.297   Anemia Panel:  Recent Labs  04/25/15 0218  VITAMINB12 940*    TELE:  Atrial fib, no pauses over 2.25 sec, RVR at times. No HR sustained < 45. 5 bt run NSVT      Radiology/Studies: Dg Chest 2 View 04/24/2015  CLINICAL DATA:  Epigastric pain and umbilical pain that began today. No shortness of breath. EXAM: CHEST  2 VIEW COMPARISON:  11/25/2014. FINDINGS: Cardiomegaly. Prior CABG. Chronic RIGHT hemidiaphragm elevation. No active infiltrates or failure. No effusion or pneumothorax. Bones unremarkable. IMPRESSION: Cardiomegaly.  No active disease. Electronically Signed   By: Staci Righter M.D.   On: 04/24/2015 19:34     Current Medications:  . allopurinol  200 mg Oral Daily  . atorvastatin  80 mg Oral Daily  . benazepril  10 mg  Oral Daily  . diltiazem  180 mg Oral Daily  . docusate sodium  100 mg Oral BID  . fluticasone  2 spray Each Nare Daily  . levothyroxine  112 mcg Oral QAC breakfast  . pantoprazole  40 mg Oral Daily  . polyethylene glycol  17 g Oral BID  . senna-docusate  1 tablet Oral BID  . traMADol  50 mg Oral 4 times per day  . warfarin  2.5 mg Oral q1800  . Warfarin - Pharmacist Dosing Inpatient   Does not apply q1800      ASSESSMENT AND PLAN: Principal Problem:   Chest pain secondary to Afib. Resolved. - no ischemic testing at this time     Atrial fibrillation with RVR (HCC) - Rate control is good w/ Dilt 180 mg, no dose change w/ pauses and some low HR - s/p DCCV- maintaining NSR. If afib recurs will need to consider antiarrhythmic drug therapy   - he is stable for DC from our standpoint.    Long term (current) use of anticoagulants - INR is therapeutic and has been - see table above    Diastolic CHF -EF [id number] with grade 2 DD 2014 - weight is at/below baseline - no SOB or new DOE    CAD S/P CFX DES 2011 after CABG 2004 - no recent ischemic sx - ez neg MI - MV low risk 2013 - will need f/u w/ Dr Aundra Dubin.  Constipation. Recommend stool softener and prn Miralax. If symptoms persist may consider changing cardizem to a beta blocker but he has been on cardizem for at least 10 yrs.  Otherwise, per IM Active Problems:   Hyperlipidemia   Obstructive sleep apnea-Failed CPAP   Essential hypertension   Hx of CABG '04   History of stroke with residual deficit   Diabetes type 2, controlled (HCC)   Spastic hemiplegia affecting nondominant side (HCC)   Hypothyroidism - TSH OK   Constipation   PAF - CAHDs VASc=7   Signed,  Peter Martin[place], [place] 04/27/2015 10:12 AM

## 2015-04-27 NOTE — Care Management (Signed)
1108 04-27-15 Jacqlyn Krauss, RN, BSN [phone] Pt did make a referral to Outpatient Rehab on [address] is agreeable and has transportation. No further needs from CM at this time. Bethena Roys, RN,BSN [phone]

## 2015-04-30 ENCOUNTER — Telehealth: Payer: Self-pay | Admitting: Behavioral Health

## 2015-04-30 ENCOUNTER — Telehealth: Payer: Self-pay | Admitting: Family

## 2015-04-30 NOTE — Telephone Encounter (Signed)
Please call pt today for TCM follow up.

## 2015-04-30 NOTE — Telephone Encounter (Signed)
Transition Care Management Follow-up Telephone Call  PCP: Debbrah Alar, NP  Date of Admission: 04/24/2015 6:40 PM Date of Discharge: 04/27/2015 Attending Physician: Thayer Headings, MD  Discharge Diagnosis: 1. Atrial Fibrillation with RVR s/p DCCV 2. Constipation 3. Chest Pain 4. Coronary Artery Disease s/p DES 2011, CABG 2004  Principal Problem:  Chest pain Active Problems:  Hyperlipidemia  Obstructive sleep apnea-Failed CPAP  Essential hypertension  Hx of CABG '04  History of stroke with residual deficit  Diabetes type 2, controlled (HCC)  Atrial fibrillation with RVR ([place])  Long term (current) use of anticoagulants  Spastic hemiplegia affecting nondominant side (HCC)  Hypothyroidism  Constipation  Diastolic CHF -EF [id number] with grade 2 DD 2014  CAD S/P CFX DES 2011  PAF - CAHDs VASc=7  Atrial fibrillation with rapid ventricular response ([place])   How have you been since you were released from the hospital? Patient states, "Everything is okay, now that I have my heart back in sinus rhythm".   Do you understand why you were in the hospital? Yes, patient voiced "my heart was out of sinus rhythm".   Do you understand the discharge instructions? yes   Where were you discharged to? Home   Items Reviewed:  Medications reviewed: yes  Allergies reviewed: yes  Dietary changes reviewed: yes, low salt diet  Referrals reviewed: None   Functional Questionnaire:   Activities of Daily Living (ADLs):   He states they are independent in the following: ambulation, bathing and hygiene, feeding, continence, grooming, toileting and dressing States they require assistance with the following: None   Any transportation issues/concerns?: no   Any patient concerns? yes, patient reported that he's having an issue with constipation, but was given something in the hospital for it and using Miralax at home.   Confirmed importance and date/time of follow-up  visits scheduled yes, 05/09/15 at 8:00 AM.  Provider Appointment booked with Debbrah Alar, NP.  Confirmed with patient if condition begins to worsen call PCP or go to the ER.  Patient was given the office number and encouraged to call back with question or concerns.  : yes

## 2015-04-30 NOTE — Telephone Encounter (Signed)
TCM/Hospital Follow-up completed.

## 2015-05-06 ENCOUNTER — Other Ambulatory Visit: Payer: Self-pay | Admitting: Cardiology

## 2015-05-08 ENCOUNTER — Ambulatory Visit: Payer: Medicare Other

## 2015-05-09 ENCOUNTER — Inpatient Hospital Stay (HOSPITAL_COMMUNITY)
Admission: AD | Admit: 2015-05-09 | Discharge: 2015-05-10 | DRG: 309 | Disposition: A | Discharge status: Home / Self Care | Payer: Medicare Other | Source: Ambulatory Visit | Attending: Cardiology | Admitting: Cardiology

## 2015-05-09 ENCOUNTER — Ambulatory Visit (HOSPITAL_COMMUNITY)
Admission: RE | Admit: 2015-05-09 | Discharge: 2015-05-09 | Disposition: A | Discharge status: Home / Self Care | Payer: Medicare Other | Source: Ambulatory Visit | Attending: Nurse Practitioner | Admitting: Nurse Practitioner

## 2015-05-09 ENCOUNTER — Ambulatory Visit (INDEPENDENT_AMBULATORY_CARE_PROVIDER_SITE_OTHER): Payer: Medicare Other | Admitting: Family

## 2015-05-09 ENCOUNTER — Encounter (HOSPITAL_COMMUNITY): Payer: Self-pay | Admitting: *Deleted

## 2015-05-09 ENCOUNTER — Encounter: Payer: Self-pay | Admitting: Family

## 2015-05-09 VITALS — BP 138/82 | HR 69 | Temp 98.0°F | Resp 18 | Ht 75.0 in | Wt 254.4 lb

## 2015-05-09 VITALS — BP 132/78 | HR 157 | Ht 75.0 in | Wt 254.6 lb

## 2015-05-09 DIAGNOSIS — Z951 Presence of aortocoronary bypass graft: Secondary | ICD-10-CM

## 2015-05-09 DIAGNOSIS — I480 Paroxysmal atrial fibrillation: Secondary | ICD-10-CM | POA: Diagnosis present

## 2015-05-09 DIAGNOSIS — I110 Hypertensive heart disease with heart failure: Secondary | ICD-10-CM | POA: Diagnosis present

## 2015-05-09 DIAGNOSIS — E039 Hypothyroidism, unspecified: Secondary | ICD-10-CM | POA: Diagnosis present

## 2015-05-09 DIAGNOSIS — I10 Essential (primary) hypertension: Secondary | ICD-10-CM | POA: Diagnosis not present

## 2015-05-09 DIAGNOSIS — G4733 Obstructive sleep apnea (adult) (pediatric): Secondary | ICD-10-CM | POA: Diagnosis present

## 2015-05-09 DIAGNOSIS — K219 Gastro-esophageal reflux disease without esophagitis: Secondary | ICD-10-CM | POA: Diagnosis present

## 2015-05-09 DIAGNOSIS — I4891 Unspecified atrial fibrillation: Secondary | ICD-10-CM

## 2015-05-09 DIAGNOSIS — Z87891 Personal history of nicotine dependence: Secondary | ICD-10-CM

## 2015-05-09 DIAGNOSIS — I5032 Chronic diastolic (congestive) heart failure: Secondary | ICD-10-CM | POA: Diagnosis present

## 2015-05-09 DIAGNOSIS — I69354 Hemiplegia and hemiparesis following cerebral infarction affecting left non-dominant side: Secondary | ICD-10-CM

## 2015-05-09 DIAGNOSIS — K5900 Constipation, unspecified: Secondary | ICD-10-CM | POA: Diagnosis not present

## 2015-05-09 DIAGNOSIS — Z9842 Cataract extraction status, left eye: Secondary | ICD-10-CM | POA: Diagnosis not present

## 2015-05-09 DIAGNOSIS — I481 Persistent atrial fibrillation: Secondary | ICD-10-CM

## 2015-05-09 DIAGNOSIS — E785 Hyperlipidemia, unspecified: Secondary | ICD-10-CM | POA: Diagnosis present

## 2015-05-09 DIAGNOSIS — Z9841 Cataract extraction status, right eye: Secondary | ICD-10-CM

## 2015-05-09 DIAGNOSIS — G8929 Other chronic pain: Secondary | ICD-10-CM | POA: Diagnosis present

## 2015-05-09 DIAGNOSIS — Z7901 Long term (current) use of anticoagulants: Secondary | ICD-10-CM | POA: Diagnosis not present

## 2015-05-09 DIAGNOSIS — I2510 Atherosclerotic heart disease of native coronary artery without angina pectoris: Secondary | ICD-10-CM | POA: Diagnosis present

## 2015-05-09 DIAGNOSIS — I25810 Atherosclerosis of coronary artery bypass graft(s) without angina pectoris: Secondary | ICD-10-CM | POA: Diagnosis not present

## 2015-05-09 LAB — PROTIME-INR
INR: 2.02 — ABNORMAL HIGH (ref 0.00–1.49)
PROTHROMBIN TIME: 22.8 s — AB (ref 11.6–15.2)

## 2015-05-09 LAB — BASIC METABOLIC PANEL
ANION GAP: 10 (ref 5–15)
BUN: 20 mg/dL (ref 6–20)
CO2: 28 mmol/L (ref 22–32)
Calcium: 9.7 mg/dL (ref 8.9–10.3)
Chloride: 104 mmol/L (ref 101–111)
Creatinine, Ser: 1.16 mg/dL (ref 0.61–1.24)
GFR calc Af Amer: >60 mL/min (ref >60)
GFR calc non Af Amer: >60 mL/min (ref >60)
GLUCOSE: 114 mg/dL — AB (ref 65–99)
POTASSIUM: 4.3 mmol/L (ref 3.5–5.1)
Sodium: 142 mmol/L (ref 135–145)

## 2015-05-09 LAB — COMPREHENSIVE METABOLIC PANEL
ALBUMIN: 3.9 g/dL (ref 3.5–5.0)
ALK PHOS: 56 U/L (ref 38–126)
ALT: 36 U/L (ref 17–63)
AST: 36 U/L (ref 15–41)
Anion gap: 10 (ref 5–15)
BILIRUBIN TOTAL: 0.6 mg/dL (ref 0.3–1.2)
BUN: 19 mg/dL (ref 6–20)
CALCIUM: 9.5 mg/dL (ref 8.9–10.3)
CO2: 27 mmol/L (ref 22–32)
CREATININE: 1.14 mg/dL (ref 0.61–1.24)
Chloride: 103 mmol/L (ref 101–111)
GFR calc Af Amer: >60 mL/min (ref >60)
GLUCOSE: 107 mg/dL — AB (ref 65–99)
POTASSIUM: 4.3 mmol/L (ref 3.5–5.1)
Sodium: 140 mmol/L (ref 135–145)
TOTAL PROTEIN: 6.8 g/dL (ref 6.5–8.1)

## 2015-05-09 LAB — CBC
HEMATOCRIT: 45.2 % (ref 39.0–52.0)
HEMOGLOBIN: 14.7 g/dL (ref 13.0–17.0)
MCH: 29.3 pg (ref 26.0–34.0)
MCHC: 32.5 g/dL (ref 30.0–36.0)
MCV: 90.2 fL (ref 78.0–100.0)
Platelets: 203 10*3/uL (ref 150–400)
RBC: 5.01 MIL/uL (ref 4.22–5.81)
RDW: 13.9 % (ref 11.5–15.5)
WBC: 8.1 10*3/uL (ref 4.0–10.5)

## 2015-05-09 LAB — TSH: TSH: 1.871 u[IU]/mL (ref 0.350–4.500)

## 2015-05-09 LAB — MAGNESIUM: MAGNESIUM: 2.1 mg/dL (ref 1.7–2.4)

## 2015-05-09 LAB — MRSA PCR SCREENING: MRSA BY PCR: NEGATIVE

## 2015-05-09 LAB — BRAIN NATRIURETIC PEPTIDE: B Natriuretic Peptide: 171.3 pg/mL — ABNORMAL HIGH (ref 0.0–100.0)

## 2015-05-09 MED ORDER — ATORVASTATIN CALCIUM 80 MG PO TABS
80.0000 mg | ORAL_TABLET | Freq: Every day | ORAL | Status: DC
  Administered 2015-05-09 – 2015-05-10 (×2): 80 mg via ORAL
  Filled 2015-05-09 (×2): qty 1

## 2015-05-09 MED ORDER — AMIODARONE LOAD VIA INFUSION
150.0000 mg | Freq: Once | INTRAVENOUS | Status: AC
  Administered 2015-05-09: 150 mg via INTRAVENOUS

## 2015-05-09 MED ORDER — AMIODARONE HCL IN DEXTROSE 360-4.14 MG/200ML-% IV SOLN
60.0000 mg/h | INTRAVENOUS | Status: DC
  Administered 2015-05-09 (×2): 60 mg/h via INTRAVENOUS
  Filled 2015-05-09: qty 200

## 2015-05-09 MED ORDER — AMIODARONE HCL IN DEXTROSE 360-4.14 MG/200ML-% IV SOLN
30.0000 mg/h | INTRAVENOUS | Status: DC
  Administered 2015-05-10 (×2): 30 mg/h via INTRAVENOUS
  Filled 2015-05-09 (×3): qty 200

## 2015-05-09 MED ORDER — SODIUM CHLORIDE 0.9% FLUSH
3.0000 mL | Freq: Two times a day (BID) | INTRAVENOUS | Status: DC
  Administered 2015-05-09 – 2015-05-10 (×3): 3 mL via INTRAVENOUS

## 2015-05-09 MED ORDER — TRAMADOL HCL 50 MG PO TABS
50.0000 mg | ORAL_TABLET | Freq: Four times a day (QID) | ORAL | Status: DC | PRN
Start: 2015-05-09 — End: 2015-05-10
  Administered 2015-05-09 – 2015-05-10 (×2): 50 mg via ORAL
  Filled 2015-05-09 (×2): qty 1

## 2015-05-09 MED ORDER — PANTOPRAZOLE SODIUM 40 MG PO TBEC
40.0000 mg | DELAYED_RELEASE_TABLET | Freq: Every day | ORAL | Status: DC
  Administered 2015-05-09 – 2015-05-10 (×2): 40 mg via ORAL
  Filled 2015-05-09 (×2): qty 1

## 2015-05-09 MED ORDER — ALUM & MAG HYDROXIDE-SIMETH 200-200-20 MG/5ML PO SUSP
30.0000 mL | ORAL | Status: DC | PRN
  Administered 2015-05-09: 30 mL via ORAL
  Filled 2015-05-09: qty 30

## 2015-05-09 MED ORDER — ONDANSETRON HCL 4 MG/2ML IJ SOLN: 4.0000 mg | Freq: Four times a day (QID) | INTRAMUSCULAR | Status: DC | PRN

## 2015-05-09 MED ORDER — METFORMIN HCL 500 MG PO TABS
500.0000 mg | ORAL_TABLET | Freq: Every day | ORAL | Status: DC
  Administered 2015-05-10: 500 mg via ORAL
  Filled 2015-05-09: qty 1

## 2015-05-09 MED ORDER — LEVOTHYROXINE SODIUM 112 MCG PO TABS
112.0000 ug | ORAL_TABLET | Freq: Every day | ORAL | Status: DC
  Administered 2015-05-10: 112 ug via ORAL
  Filled 2015-05-09: qty 1

## 2015-05-09 MED ORDER — SODIUM CHLORIDE 0.9% FLUSH: 3.0000 mL | INTRAVENOUS | Status: DC | PRN

## 2015-05-09 MED ORDER — DILTIAZEM HCL ER COATED BEADS 180 MG PO CP24: 360.0000 mg | ORAL_CAPSULE | Freq: Every day | ORAL | Status: DC

## 2015-05-09 MED ORDER — WARFARIN - PHARMACIST DOSING INPATIENT
Freq: Every day | Status: DC
  Administered 2015-05-10: 18:00:00

## 2015-05-09 MED ORDER — DOCUSATE SODIUM 100 MG PO CAPS
100.0000 mg | ORAL_CAPSULE | Freq: Two times a day (BID) | ORAL | Status: DC
  Administered 2015-05-10: 100 mg via ORAL
  Filled 2015-05-09: qty 1

## 2015-05-09 MED ORDER — WARFARIN SODIUM 2.5 MG PO TABS
2.5000 mg | ORAL_TABLET | Freq: Every day | ORAL | Status: DC
  Administered 2015-05-10: 2.5 mg via ORAL
  Filled 2015-05-09 (×2): qty 1

## 2015-05-09 MED ORDER — ACETAMINOPHEN 325 MG PO TABS: 650.0000 mg | ORAL_TABLET | ORAL | Status: DC | PRN

## 2015-05-09 MED ORDER — ATORVASTATIN CALCIUM 40 MG PO TABS: 80.0000 mg | ORAL_TABLET | Freq: Every day | ORAL | Status: DC

## 2015-05-09 MED ORDER — SODIUM CHLORIDE 0.9 % IV SOLN
250.0000 mL | INTRAVENOUS | Status: DC | PRN
  Administered 2015-05-10: 12:00:00 via INTRAVENOUS

## 2015-05-09 MED ORDER — ALLOPURINOL 100 MG PO TABS
200.0000 mg | ORAL_TABLET | Freq: Every day | ORAL | Status: DC
  Administered 2015-05-10: 200 mg via ORAL
  Filled 2015-05-09 (×2): qty 2

## 2015-05-09 MED ORDER — FUROSEMIDE 40 MG PO TABS
40.0000 mg | ORAL_TABLET | ORAL | Status: DC
  Administered 2015-05-10: 40 mg via ORAL
  Filled 2015-05-09: qty 1

## 2015-05-09 MED ORDER — BENAZEPRIL HCL 20 MG PO TABS
10.0000 mg | ORAL_TABLET | Freq: Every day | ORAL | Status: DC
  Administered 2015-05-09 – 2015-05-10 (×2): 10 mg via ORAL
  Filled 2015-05-09 (×2): qty 1

## 2015-05-09 NOTE — Progress Notes (Addendum)
Patient ID: [name], male   DOB: [birth year], 70 y.o.   MRN: [id number]     Primary Care Physician: [name]., NP Referring Cardiologist: Dr. [name] is a 70 y.o. male with a h/o CAD, s/p bypass, prior stroke,OSA, untreated, PAF, that developed persistent afib and was admitted with RVR 2/7 and d/ced 2/10 after successful DCCV. Pt states his trigger for first episode was constipation that lasted 6-7 days and came on after straining for a bowel movement.   He is in the afib clinc today after being seen by PCP this am and found to be back in afib with rvr, v rate around 150-160.He thinks afib returned late Monday or Tuesday, he was aware of afib not by fast heart rate, but with shortness of breath, walking off balance and lightheadedness. The trigger for this last outbreak of afib, pt thinks, is that he inadvertently forgot to take cardizem for several days after cardioversion. He has not missed any coumadin and INR upon review has been therapeutic, INR cbc, bmet to be checked now. He took cardizem 180mg  early this am but repeated dose  at 10 am but  with continued persistent high v rates.  Today, he denies symptoms of palpitations, chest pain, shortness of breath, orthopnea, PND, lower extremity edema, dizziness, presyncope, syncope, or neurologic sequela. The patient is tolerating medications without difficulties and is otherwise without complaint today.   Past Medical History  Diagnosis Date  . GERD (gastroesophageal reflux disease)   . Gout   . Hyperlipidemia   . Hypertension   . CVA (cerebral infarction)     left hemiparesis  . Chronic pain     left sided-Kristeins  . CAD (coronary artery disease)     s/p CABG, s/p DES to LCX  January 2011  . OSA on CPAP   . Sleep apnea   . History of colonoscopy   . Stroke ([place])   . Dysrhythmia     hx of atrial fibrilation with cardioversion  . Hypothyroidism   . Diastolic CHF [facility])    Past Surgical History    Procedure Laterality Date  . Coronary artery bypass graft      stent  . Esophagogastroduodenoscopy  03-25-2005  . Nasal septum surgery    . Percutaneous placement intravascular stent cervical carotid artery      03-2009; using a drug-eluting platform of the circumflex cornoray artery with a 3.0 x 18 [employer] Promus drug-eluting platform post dilated to 3.75 with a noncompliant balloon.  . Cataract extraction  05/2010    left eye  . Cataract extraction  04/2010    right eye  . Cardioversion  06/20/2011    Procedure: CARDIOVERSION;  Surgeon: Larey Dresser, MD;  Location: [facility] ENDOSCOPY;  Service: Cardiovascular;  Laterality: N/A;  . Tee without cardioversion  06/20/2011    Procedure: TRANSESOPHAGEAL ECHOCARDIOGRAM (TEE);  Surgeon: Larey Dresser, MD;  Location: [facility];  Service: Cardiovascular;  Laterality: N/A;  . Cardioversion N/A 04/26/2015    Procedure: CARDIOVERSION;  Surgeon: Dorothy Spark, MD;  Location: [facility] ENDOSCOPY;  Service: Cardiovascular;  Laterality: N/A;    Current Outpatient Prescriptions  Medication Sig Dispense Refill  . allopurinol (ZYLOPRIM) 100 MG tablet TAKE 2 TABLETS BY MOUTH DAILY. D/C PREVIOUS SCRIPTS FOR THIS MEDICATION 180 tablet 1  . atorvastatin (LIPITOR) 40 MG tablet Take 2 tablets (80 mg total) by mouth daily. 180 tablet 1  . benazepril (LOTENSIN) 10 MG tablet TAKE 1 TABLET  BY MOUTH DAILY D/C PREVIOUS SCRIPTS FOR THIS MEDICATION 90 tablet 1  . diltiazem (CARDIZEM CD) 180 MG 24 hr capsule Take 2 capsules (360 mg total) by mouth daily. 90 capsule 1  . docusate sodium (COLACE) 100 MG capsule Take 1 capsule (100 mg total) by mouth 2 (two) times daily. 10 capsule 0  . fluticasone (FLONASE) 50 MCG/ACT nasal spray Place 2 sprays into both nostrils daily. 9.9 g 1  . furosemide (LASIX) 40 MG tablet TAKE 1 TABLET BY MOUTH EVERY OTHER DAY 45 tablet 1  . levothyroxine (SYNTHROID, LEVOTHROID) 112 MCG tablet TAKE 1 TABLET (112 MCG TOTAL) BY MOUTH DAILY BEFORE  BREAKFAST. 90 tablet 1  . metFORMIN (GLUCOPHAGE) 500 MG tablet TAKE 1 TABLET EVERY DAY WITH BREAKFAST 90 tablet 0  . Multiple Vitamins-Minerals (MULTIVITAMIN GUMMIES MENS PO) Take 1 tablet by mouth daily.    . nitroGLYCERIN (NITROSTAT) 0.4 MG SL tablet Place 1 tablet (0.4 mg total) under the tongue every 5 (five) minutes as needed for chest pain. Up to 3 doses 10 tablet 2  . pantoprazole (PROTONIX) 40 MG tablet Take 1 tablet (40 mg total) by mouth daily. 90 tablet 1  . traMADol (ULTRAM) 50 MG tablet Take 1 tablet (50 mg total) by mouth every 6 (six) hours as needed. 180 tablet 1  . traMADol (ULTRAM-ER) 200 MG 24 hr tablet TAKE 1 TABLET EVERY DAY WITH AM DOSE OF TRAMADOL 50 MG 90 tablet 1  . vitamin B-12 1000 MCG tablet Take 1 tablet (1,000 mcg total) by mouth daily. 30 tablet 0  . warfarin (COUMADIN) 2.5 MG tablet Take 2.5 mg by mouth daily.    Marland Kitchen warfarin (COUMADIN) 2.5 MG tablet TAKE AS DIRECTED BY ANTICOAGULATION CLINIC 100 tablet 0   No current facility-administered medications for this encounter.    No Known Allergies  Social History   Social History  . Marital Status: Married    Spouse Name: [name]  . Number of Children: N/A  . Years of Education: N/A   Occupational History  . retired Hydrographic surveyor   Social History Main Topics  . Smoking status: Former Research scientist (life sciences)  . Smokeless tobacco: Never Used  . Alcohol Use: No     Comment: 1 beer a month  . Drug Use: No  . Sexual Activity: No   Other Topics Concern  . Not on file   Social History Narrative   Pt lives with wife. Does have stairs, but patient doesn't use them. Pt has completed technical school    Family History  Problem Relation Age of Onset  . Lung cancer Father     deceased  . Stroke Mother     deceased-MINISTROKES    ROS- All systems are reviewed and negative except as per the HPI above  Physical Exam: Filed Vitals:   05/09/15 1149  BP: 132/78  Pulse: 157  Height: 6\' 3"  (1.905 m)  Weight:  254 lb 9.6 oz (115.486 kg)    GEN- The patient is well appearing, alert and oriented x 3 today.   Head- normocephalic, atraumatic Eyes-  Sclera clear, conjunctiva pink Ears- hearing intact Oropharynx- clear Neck- supple, no JVP Lymph- no cervical lymphadenopathy Lungs- Clear to ausculation bilaterally, normal work of breathing Heart- Rapid,irregular rate and rhythm, no murmurs, rubs or gallops, PMI not laterally displaced GI- soft, NT, ND, + BS Extremities- no clubbing, cyanosis, or edema MS- no significant deformity or atrophy Skin- no rash or lesion Psych- euthymic mood, full affect Neuro-  strength and sensation are intact  EKG-Afib with rvr, with PVC's or aberrantly conducted complexes, LAFB, voltage criteria for LVH, qrs int 92 ms, Qtc, 459 ms Epic records reviewed  Echo-2/17-Left ventricle: The cavity size was normal. There was mild concentric hypertrophy. Systolic function was normal. The estimated ejection fraction was in the range of 50% to 55%. Wall motion was normal; there were no regional wall motion abnormalities. Left ventricular diastolic function parameters were normal. - Aortic valve: Trileaflet; mildly thickened, mildly calcified leaflets. - Left atrium: The atrium was mildly dilated. 43 mm - Tricuspid valve: There was moderate regurgitation.  Assessment and Plan: 1. Persistent symptomatic afib Pt did eat at 10 am today and there are no available outpatient cardioversion times available until Friday Discussed with Dr. Aundra Dubin who also was in to assess/exam pt and with rapid v rate and being symptomatic, it is felt hospitalization is warranted with amiodarone drip anticipated. He will be admitted Dr. Aundra Dubin to [place] pending  Geroge Baseman. [place], [facility] [address] [place], [place] [address] [phone]  Patient seen with NP, agree with the above note.   1. Atrial fibrillation: Recurrent atrial  fibrillation with RVR.  He has a history of PAF and CVA in the past. Was admitted with afib/RVR earlier in 2/17 and cardioverted to NSR.  He went back into atrial fibrillation with RVR earlier this week.  He developed significant exertional dyspnea to the point where it is hard to move around the house. Seen at PCP's office, HR 150s in afib.  Diltiazem CD has not slowed rate.  He therefore needs admission for IV rate control and cardioversion. CHADSVASC = 7.  - Would admit, I think we will need an antiarrhythmic to maintain NSR.  He has CAD so not candidate for class IC.  QTc is a bit prolonged, so avoid sotalol and Tikosyn.  Options are therefore amiodarone and Multaq.  With RVR, will start on amiodarone gtt for rate control. Will keep him on amiodarone after cardioversion to maintain NSR. - I will arrange for DCCV, hopefully tomorrow, on amiodarone.  INR has been therapeutic recently, will need to check today. If INR stays therapeutic, will not need TEE.  2. CAD: S/p CABG.  No chest pain.  Continue statin. No ASA given warfarin use.  3. Chronic diastolic CHF: Increased dyspnea appears to be due to atrial fibrillation with RVR.  He is not volume overloaded by my exam.  Continue home Lasix. EF 50-55% on recent echo.   [name] 05/09/2015 12:46 PM

## 2015-05-09 NOTE — Assessment & Plan Note (Addendum)
Tramadol probably is a contributing factor. Improved, he stopped colace 2 days ago. Advised pt to continue colace to prevent constipation going forward. Hold if diarrhea.  He is not currently using miralax, will remove from list.

## 2015-05-09 NOTE — Progress Notes (Signed)
Subjective:    Patient ID: [name], male    DOB: June 30, 1945, 70 y.o.   MRN: [id number]  HPI  [name] is a 70 yr old male who presents today for hospital follow up.  Record is reviewed.  Pt was admitted 2/7-2/10/17 due to AF with RVR.  He underwent successful DC cardioversion during his hospitalization. He reports feeling SOB for a couple of days. Denies CP.  He reports that he had forgotten to take his cardizem for 2 days.  Did take a dose this AM.  (forgot to put in his weekly pillbox by accident).  Wt Readings from Last 3 Encounters:  05/09/15 254 lb 6.4 oz (115.395 kg)  04/27/15 254 lb 8 oz (115.44 kg)  03/05/15 259 lb 6.4 oz (117.663 kg)    Constipation-this was an issue during his hospitalization.  Reports that he has been taking stool softners one stool softner in the evening.  He stopped stool softner on Monday.   Review of Systems See HPI  Past Medical History  Diagnosis Date  . GERD (gastroesophageal reflux disease)   . Gout   . Hyperlipidemia   . Hypertension   . CVA (cerebral infarction)     left hemiparesis  . Chronic pain     left sided-Kristeins  . CAD (coronary artery disease)     s/p CABG, s/p DES to LCX  January 2011  . OSA on CPAP   . Sleep apnea   . History of colonoscopy   . Stroke ([place])   . Dysrhythmia     hx of atrial fibrilation with cardioversion  . Hypothyroidism   . Diastolic CHF [facility])     Social History   Social History  . Marital Status: Married    Spouse Name: [name]  . Number of Children: N/A  . Years of Education: N/A   Occupational History  . retired Hydrographic surveyor   Social History Main Topics  . Smoking status: Former Research scientist (life sciences)  . Smokeless tobacco: Never Used  . Alcohol Use: No     Comment: 1 beer a month  . Drug Use: No  . Sexual Activity: No   Other Topics Concern  . Not on file   Social History Narrative   Pt lives with wife. Does have stairs, but patient doesn't use them. Pt has completed  technical school    Past Surgical History  Procedure Laterality Date  . Coronary artery bypass graft      stent  . Esophagogastroduodenoscopy  03-25-2005  . Nasal septum surgery    . Percutaneous placement intravascular stent cervical carotid artery      03-2009; using a drug-eluting platform of the circumflex cornoray artery with a 3.0 x 18 [employer] Promus drug-eluting platform post dilated to 3.75 with a noncompliant balloon.  . Cataract extraction  05/2010    left eye  . Cataract extraction  04/2010    right eye  . Cardioversion  06/20/2011    Procedure: CARDIOVERSION;  Surgeon: Larey Dresser, MD;  [facility] ENDOSCOPY;  Service: Cardiovascular;  Laterality: N/A;  . Tee without cardioversion  06/20/2011    Procedure: TRANSESOPHAGEAL ECHOCARDIOGRAM (TEE);  Surgeon: Larey Dresser, MD;  Location: [facility];  Service: Cardiovascular;  Laterality: N/A;  . Cardioversion N/A 04/26/2015    Procedure: CARDIOVERSION;  Surgeon: Dorothy Spark, MD;  Location: [facility] ENDOSCOPY;  Service: Cardiovascular;  Laterality: N/A;    Family History  Problem Relation Age of Onset  .  Lung cancer Father     deceased  . Stroke Mother     deceased-MINISTROKES    No Known Allergies  Current Outpatient Prescriptions on File Prior to Visit  Medication Sig Dispense Refill  . allopurinol (ZYLOPRIM) 100 MG tablet TAKE 2 TABLETS BY MOUTH DAILY. D/C PREVIOUS SCRIPTS FOR THIS MEDICATION 180 tablet 1  . atorvastatin (LIPITOR) 40 MG tablet Take 2 tablets (80 mg total) by mouth daily. 60 tablet 5  . benazepril (LOTENSIN) 10 MG tablet TAKE 1 TABLET BY MOUTH DAILY D/C PREVIOUS SCRIPTS FOR THIS MEDICATION 90 tablet 1  . diltiazem (CARDIZEM CD) 180 MG 24 hr capsule TAKE 1 CAPSULE EVERY DAY 90 capsule 1  . fluticasone (FLONASE) 50 MCG/ACT nasal spray Place 2 sprays into both nostrils daily. 9.9 g 1  . furosemide (LASIX) 40 MG tablet TAKE 1 TABLET BY MOUTH EVERY OTHER DAY 45 tablet 1  . levothyroxine (SYNTHROID,  LEVOTHROID) 112 MCG tablet TAKE 1 TABLET (112 MCG TOTAL) BY MOUTH DAILY BEFORE BREAKFAST. 90 tablet 1  . metFORMIN (GLUCOPHAGE) 500 MG tablet TAKE 1 TABLET EVERY DAY WITH BREAKFAST 90 tablet 0  . Multiple Vitamins-Minerals (MULTIVITAMIN GUMMIES MENS PO) Take 1 tablet by mouth daily.    . nitroGLYCERIN (NITROSTAT) 0.4 MG SL tablet Place 1 tablet (0.4 mg total) under the tongue every 5 (five) minutes as needed for chest pain. Up to 3 doses 10 tablet 2  . pantoprazole (PROTONIX) 40 MG tablet Take 1 tablet (40 mg total) by mouth daily. 90 tablet 1  . traMADol (ULTRAM) 50 MG tablet Take 1 tablet (50 mg total) by mouth every 6 (six) hours as needed. 180 tablet 1  . traMADol (ULTRAM-ER) 200 MG 24 hr tablet TAKE 1 TABLET EVERY DAY WITH AM DOSE OF TRAMADOL 50 MG 90 tablet 1  . vitamin B-12 1000 MCG tablet Take 1 tablet (1,000 mcg total) by mouth daily. 30 tablet 0  . warfarin (COUMADIN) 2.5 MG tablet Take 2.5 mg by mouth daily.    Marland Kitchen warfarin (COUMADIN) 2.5 MG tablet TAKE AS DIRECTED BY ANTICOAGULATION CLINIC 100 tablet 0  . docusate sodium (COLACE) 100 MG capsule Take 1 capsule (100 mg total) by mouth 2 (two) times daily. (Patient not taking: Reported on 05/09/2015) 10 capsule 0  . polyethylene glycol (MIRALAX / GLYCOLAX) packet Take 17 g by mouth daily as needed. (Patient not taking: Reported on 05/09/2015) 14 each 0   No current facility-administered medications on file prior to visit.    BP 138/82 mmHg  Pulse 69  Temp(Src) 98 F (36.7 C) (Oral)  Resp 18  Ht 6\' 3"  (1.905 m)  Wt 254 lb 6.4 oz (115.395 kg)  BMI 31.80 kg/m2  SpO2 97%       Objective:   Physical Exam  Constitutional: He is oriented to person, place, and time. He appears well-developed and well-nourished. No distress.  HENT:  Head: Normocephalic and atraumatic.  Cardiovascular: An irregularly irregular rhythm present. Tachycardia present.   No murmur heard. Pulmonary/Chest: Effort normal and breath sounds normal. No respiratory  distress. He has no wheezes. He has no rales.  Musculoskeletal:  1+ bilateral LE edema.   Neurological: He is alert and oriented to person, place, and time.  Skin: Skin is warm and dry.  Psychiatric: He has a normal mood and affect. His behavior is normal. Thought content normal.          Assessment & Plan:

## 2015-05-09 NOTE — Care Management Note (Signed)
Case Management Note  Patient Details  Name: Camile Ruddle MRN: [id number] Date of Birth: 20-Jan-1946  Subjective/Objective:   Adm w at fib                 Action/Plan: lives w wife, was set up for outpt phy there on church last adm   Expected Discharge Date:                  Expected Discharge Plan:     In-House Referral:     Discharge planning Services     Post Acute Care Choice:    Choice offered to:     DME Arranged:    DME Agency:     HH Arranged:    [place] Agency:     Status of Service:     Medicare Important Message Given:    Date Medicare IM Given:    Medicare IM give by:    Date Additional Medicare IM Given:    Additional Medicare Important Message give by:     If discussed at [place] of Stay Meetings, dates discussed:    Additional Comments:ur review done  Lacretia Leigh, RN 05/09/2015, 2:04 PM

## 2015-05-09 NOTE — Progress Notes (Signed)
Report called to nurse on [place] - all questions answered. Transported pt on telemetry monitor without any difficulty. Afib 130-150s. IV in place. Pt A/O on arrival.

## 2015-05-09 NOTE — Progress Notes (Signed)
ANTICOAGULATION CONSULT NOTE - Initial Consult  Pharmacy Consult for warfarin Indication: atrial fibrillation  No Known Allergies  Patient Measurements: Height: 6\' 3"  (190.5 cm) Weight: 253 lb (114.76 kg) IBW/kg (Calculated) : 84.5  Vital Signs: Temp: 98.3 F (36.8 C) (02/22 1400) Temp Source: Oral (02/22 1400) BP: 117/65 mmHg (02/22 1430) Pulse Rate: 86 (02/22 1430)  Labs:  Recent Labs  05/09/15 1233  HGB 14.7  HCT 45.2  PLT 203  LABPROT 22.8*  INR 2.02*  CREATININE 1.16    Estimated Creatinine Clearance: 79.8 mL/min (by C-G formula based on Cr of 1.16).   Medical History: Past Medical History  Diagnosis Date  . GERD (gastroesophageal reflux disease)   . Gout   . Hyperlipidemia   . Hypertension   . CVA (cerebral infarction)     left hemiparesis  . Chronic pain     left sided-Kristeins  . CAD (coronary artery disease)     s/p CABG, s/p DES to LCX  January 2011  . OSA on CPAP   . Sleep apnea   . History of colonoscopy   . Stroke ([place])   . Dysrhythmia     hx of atrial fibrilation with cardioversion  . Hypothyroidism   . Diastolic CHF [facility])     Assessment: 70 y.o. male with a h/o CAD, s/p bypass, prior stroke,OSA, untreated, PAF, that developed persistent afib and was admitted with RVR 2/7 and d/ced 2/10 after successful DCCV.  Seen in afib clinic today, back in afib with rvr HR 150-160s, symptomatic.  INR at goal at 2.0, cbc within normal limits.  Patient was last seen in the coumadin clinic on 2/3 with INR of 2.7 on 2.5mg  daily. Will continue this regimen.  Will need to watch INR closely with the new addition of amiodarone. No other drug interactions noted with the warfarin or amio.  Goal of Therapy:  INR 2-3 Monitor platelets by anticoagulation protocol: Yes   Plan:  Warfarin 2.5mg  daily Daily INR for now  Erin Hearing PharmD., BCPS Clinical Pharmacist Pager [phone] 05/09/2015 2:54 PM

## 2015-05-09 NOTE — Assessment & Plan Note (Signed)
Pt missed a few doses of cardizem, though reports he restarted this AM. HR 155 on EKG.  Discussed case with Dr. Aundra Dubin, pt's cardiologist. He recommends pt take additional dose of diltiazem 180 today and then begin 360mg  daily tomorrow. He will try to arrange follow up for the pt in the AF clinic tomorrow. I advised pt to go to the ER if he develops worsening SOB in the meantime.

## 2015-05-09 NOTE — Patient Instructions (Signed)
Take an additional tablet of Diltiazem CD as soon as you get home. Then take 2 tabs once daily beginning tomorrow.  Further instructions will be per Dr. Aundra Dubin.  He is going to try to get you into the A fib clinic tomorrow. If your shortness of breath worsens between now and when you see cardiology, please proceed to the ER. Restart colace (stool softner) once daily.

## 2015-05-10 ENCOUNTER — Inpatient Hospital Stay (HOSPITAL_COMMUNITY): Payer: Medicare Other | Admitting: Anesthesiology

## 2015-05-10 ENCOUNTER — Encounter (HOSPITAL_COMMUNITY)
Admission: AD | Disposition: A | Discharge status: Home / Self Care | Payer: Self-pay | Source: Ambulatory Visit | Attending: Cardiology

## 2015-05-10 ENCOUNTER — Encounter (HOSPITAL_COMMUNITY): Payer: Self-pay | Admitting: *Deleted

## 2015-05-10 DIAGNOSIS — I4891 Unspecified atrial fibrillation: Secondary | ICD-10-CM

## 2015-05-10 HISTORY — PX: CARDIOVERSION: SHX1299

## 2015-05-10 LAB — BASIC METABOLIC PANEL
ANION GAP: 9 (ref 5–15)
BUN: 17 mg/dL (ref 6–20)
CHLORIDE: 103 mmol/L (ref 101–111)
CO2: 30 mmol/L (ref 22–32)
Calcium: 9 mg/dL (ref 8.9–10.3)
Creatinine, Ser: 1.21 mg/dL (ref 0.61–1.24)
GFR calc Af Amer: >60 mL/min (ref >60)
GFR calc non Af Amer: 58 mL/min — ABNORMAL LOW (ref >60)
GLUCOSE: 102 mg/dL — AB (ref 65–99)
POTASSIUM: 3.7 mmol/L (ref 3.5–5.1)
Sodium: 142 mmol/L (ref 135–145)

## 2015-05-10 LAB — PROTIME-INR
INR: 2.33 — ABNORMAL HIGH (ref 0.00–1.49)
PROTHROMBIN TIME: 25.3 s — AB (ref 11.6–15.2)

## 2015-05-10 SURGERY — CARDIOVERSION
Anesthesia: General

## 2015-05-10 MED ORDER — METOPROLOL SUCCINATE ER 25 MG PO TB24: 25.0000 mg | ORAL_TABLET | Freq: Every day | ORAL | Status: DC

## 2015-05-10 MED ORDER — PROPOFOL 10 MG/ML IV BOLUS
INTRAVENOUS | Status: DC | PRN
  Administered 2015-05-10: 60 mg via INTRAVENOUS
  Administered 2015-05-10: 10 mg via INTRAVENOUS

## 2015-05-10 MED ORDER — LIDOCAINE HCL (CARDIAC) 20 MG/ML IV SOLN
INTRAVENOUS | Status: DC | PRN
  Administered 2015-05-10: 60 mg via INTRATRACHEAL

## 2015-05-10 MED ORDER — METOPROLOL SUCCINATE ER 25 MG PO TB24
25.0000 mg | ORAL_TABLET | Freq: Every day | ORAL | Status: DC
  Administered 2015-05-10: 25 mg via ORAL
  Filled 2015-05-10: qty 1

## 2015-05-10 MED ORDER — AMIODARONE HCL 200 MG PO TABS
400.0000 mg | ORAL_TABLET | Freq: Two times a day (BID) | ORAL | Status: DC
  Administered 2015-05-10: 400 mg via ORAL
  Filled 2015-05-10: qty 2

## 2015-05-10 MED ORDER — AMIODARONE HCL 200 MG PO TABS: 200.0000 mg | ORAL_TABLET | Freq: Every day | ORAL | Status: DC

## 2015-05-10 NOTE — H&P (Signed)
H&P   Primary Care Physician: [name]., NP Referring Cardiologist: Dr. [name] is a 70 y.o. male with a h/o CAD, s/p bypass, prior stroke,OSA, untreated, PAF, that developed persistent afib and was admitted with RVR 2/7 and d/ced 2/10 after successful DCCV. Pt states his trigger for first episode was constipation that lasted 6-7 days and came on after straining for a bowel movement.  He is in the afib clinc today after being seen by PCP this am and found to be back in afib with rvr, v rate around 150-160.He thinks afib returned late Monday or Tuesday, he was aware of afib not by fast heart rate, but with shortness of breath, walking off balance and lightheadedness. The trigger for this last outbreak of afib, pt thinks, is that he inadvertently forgot to take cardizem for several days after cardioversion. He has not missed any coumadin and INR upon review has been therapeutic, INR cbc, bmet to be checked now. He took cardizem 180mg  early this am but repeated dose at 10 am but with continued persistent high v rates.  Today, he denies symptoms of palpitations, chest pain, shortness of breath, orthopnea, PND, lower extremity edema, dizziness, presyncope, syncope, or neurologic sequela. The patient is tolerating medications without difficulties and is otherwise without complaint today.   Past Medical History  Diagnosis Date  . GERD (gastroesophageal reflux disease)   . Gout   . Hyperlipidemia   . Hypertension   . CVA (cerebral infarction)     left hemiparesis  . Chronic pain     left sided-[place]  . CAD (coronary artery disease)     s/p CABG, s/p DES to LCX January 2011  . OSA on CPAP   . Sleep apnea   . History of colonoscopy   . Stroke ([place])   . Dysrhythmia     hx of atrial fibrilation with cardioversion  . Hypothyroidism   . Diastolic CHF [facility])    Past Surgical History  Procedure  Laterality Date  . Coronary artery bypass graft      stent  . Esophagogastroduodenoscopy  03-25-2005  . Nasal septum surgery    . Percutaneous placement intravascular stent cervical carotid artery      03-2009; using a drug-eluting platform of the circumflex cornoray artery with a 3.0 x 18 [employer] Promus drug-eluting platform post dilated to 3.75 with a noncompliant balloon.  . Cataract extraction  05/2010    left eye  . Cataract extraction  04/2010    right eye  . Cardioversion  06/20/2011    Procedure: CARDIOVERSION; Surgeon: Larey Dresser, MD; Location: [facility] ENDOSCOPY; Service: Cardiovascular; Laterality: N/A;  . Tee without cardioversion  06/20/2011    Procedure: TRANSESOPHAGEAL ECHOCARDIOGRAM (TEE); Surgeon: Larey Dresser, MD; Location: [facility]; Service: Cardiovascular; Laterality: N/A;  . Cardioversion N/A 04/26/2015    Procedure: CARDIOVERSION; Surgeon: Dorothy Spark, MD; Location: [facility] ENDOSCOPY; Service: Cardiovascular; Laterality: N/A;    Current Outpatient Prescriptions  Medication Sig Dispense Refill  . allopurinol (ZYLOPRIM) 100 MG tablet TAKE 2 TABLETS BY MOUTH DAILY. D/C PREVIOUS SCRIPTS FOR THIS MEDICATION 180 tablet 1  . atorvastatin (LIPITOR) 40 MG tablet Take 2 tablets (80 mg total) by mouth daily. 180 tablet 1  . benazepril (LOTENSIN) 10 MG tablet TAKE 1 TABLET BY MOUTH DAILY D/C PREVIOUS SCRIPTS FOR THIS MEDICATION 90 tablet 1  . diltiazem (CARDIZEM CD) 180 MG 24 hr capsule Take 2 capsules (360 mg total) by mouth daily. 90 capsule 1  .  docusate sodium (COLACE) 100 MG capsule Take 1 capsule (100 mg total) by mouth 2 (two) times daily. 10 capsule 0  . fluticasone (FLONASE) 50 MCG/ACT nasal spray Place 2 sprays into both nostrils daily. 9.9 g 1  . furosemide (LASIX) 40 MG tablet TAKE 1 TABLET BY MOUTH EVERY OTHER DAY 45 tablet 1  . levothyroxine  (SYNTHROID, LEVOTHROID) 112 MCG tablet TAKE 1 TABLET (112 MCG TOTAL) BY MOUTH DAILY BEFORE BREAKFAST. 90 tablet 1  . metFORMIN (GLUCOPHAGE) 500 MG tablet TAKE 1 TABLET EVERY DAY WITH BREAKFAST 90 tablet 0  . Multiple Vitamins-Minerals (MULTIVITAMIN GUMMIES MENS PO) Take 1 tablet by mouth daily.    . nitroGLYCERIN (NITROSTAT) 0.4 MG SL tablet Place 1 tablet (0.4 mg total) under the tongue every 5 (five) minutes as needed for chest pain. Up to 3 doses 10 tablet 2  . pantoprazole (PROTONIX) 40 MG tablet Take 1 tablet (40 mg total) by mouth daily. 90 tablet 1  . traMADol (ULTRAM) 50 MG tablet Take 1 tablet (50 mg total) by mouth every 6 (six) hours as needed. 180 tablet 1  . traMADol (ULTRAM-ER) 200 MG 24 hr tablet TAKE 1 TABLET EVERY DAY WITH AM DOSE OF TRAMADOL 50 MG 90 tablet 1  . vitamin B-12 1000 MCG tablet Take 1 tablet (1,000 mcg total) by mouth daily. 30 tablet 0  . warfarin (COUMADIN) 2.5 MG tablet Take 2.5 mg by mouth daily.    Marland Kitchen warfarin (COUMADIN) 2.5 MG tablet TAKE AS DIRECTED BY ANTICOAGULATION CLINIC 100 tablet 0   No current facility-administered medications for this encounter.    No Known Allergies  Social History   Social History  . Marital Status: Married    Spouse Name: [name]  . Number of Children: N/A  . Years of Education: N/A   Occupational History  . retired Hydrographic surveyor   Social History Main Topics  . Smoking status: Former Research scientist (life sciences)  . Smokeless tobacco: Never Used  . Alcohol Use: No     Comment: 1 beer a month  . Drug Use: No  . Sexual Activity: No   Other Topics Concern  . Not on file   Social History Narrative   Pt lives with wife. Does have stairs, but patient doesn't use them. Pt has completed technical school    Family History  Problem Relation Age of Onset  . Lung cancer Father     deceased  . Stroke  Mother     deceased-MINISTROKES    ROS- All systems are reviewed and negative except as per the HPI above  Physical Exam: Filed Vitals:   05/09/15 1149  BP: 132/78  Pulse: 157  Height: 6\' 3"  (1.905 m)  Weight: 254 lb 9.6 oz (115.486 kg)    GEN- The patient is well appearing, alert and oriented x 3 today.  Head- normocephalic, atraumatic Eyes- Sclera clear, conjunctiva pink Ears- hearing intact Oropharynx- clear Neck- supple, no JVP Lymph- no cervical lymphadenopathy Lungs- Clear to ausculation bilaterally, normal work of breathing Heart- Rapid,irregular rate and rhythm, no murmurs, rubs or gallops, PMI not laterally displaced GI- soft, NT, ND, + BS Extremities- no clubbing, cyanosis, or edema MS- no significant deformity or atrophy Skin- no rash or lesion Psych- euthymic mood, full affect Neuro- strength and sensation are intact  EKG-Afib with rvr, with PVC's or aberrantly conducted complexes, LAFB, voltage criteria for LVH, qrs int 92 ms, Qtc, 459 ms Epic records reviewed  Echo-2/17-Left ventricle: The cavity size was normal.  There was mild concentric hypertrophy. Systolic function was normal. The estimated ejection fraction was in the range of 50% to 55%. Wall motion was normal; there were no regional wall motion abnormalities. Left ventricular diastolic function parameters were normal. - Aortic valve: Trileaflet; mildly thickened, mildly calcified leaflets. - Left atrium: The atrium was mildly dilated. 43 mm - Tricuspid valve: There was moderate regurgitation.  Assessment and Plan: 1. Persistent symptomatic afib Pt did eat at 10 am today and there are no available outpatient cardioversion times available until Friday Discussed with Dr. Aundra Dubin who also was in to assess/exam pt and with rapid v rate and being symptomatic, it is felt hospitalization is warranted with amiodarone drip anticipated. He will be admitted Dr. Aundra Dubin to  [place] pending  Geroge Baseman. [place], [facility] [address] [place], [place] [address] [phone]  Patient seen with NP, agree with the above note.  1. Atrial fibrillation: Recurrent atrial fibrillation with RVR. He has a history of PAF and CVA in the past. Was admitted with afib/RVR earlier in 2/17 and cardioverted to NSR. He went back into atrial fibrillation with RVR earlier this week. He developed significant exertional dyspnea to the point where it is hard to move around the house. Seen at PCP's office, HR 150s in afib. Diltiazem CD has not slowed rate. He therefore needs admission for IV rate control and cardioversion. CHADSVASC = 7.  - Would admit, I think we will need an antiarrhythmic to maintain NSR. He has CAD so not candidate for class IC. QTc is a bit prolonged, so avoid sotalol and Tikosyn. Options are therefore amiodarone and Multaq. With RVR, will start on amiodarone gtt for rate control. Will keep him on amiodarone after cardioversion to maintain NSR. - I will arrange for DCCV, hopefully tomorrow, on amiodarone. INR has been therapeutic recently, will need to check today. If INR stays therapeutic, will not need TEE.  2. CAD: S/p CABG. No chest pain. Continue statin. No ASA given warfarin use.  3. Chronic diastolic CHF: Increased dyspnea appears to be due to atrial fibrillation with RVR. He is not volume overloaded by my exam. Continue home Lasix. EF 50-55% on recent echo.   [name] 05/09/2015 12:46 PM

## 2015-05-10 NOTE — Anesthesia Postprocedure Evaluation (Signed)
Anesthesia Post Note  Patient: [name]  Procedure(s) Performed: Procedure(s) (LRB): CARDIOVERSION (N/A)  Patient location during evaluation: PACU Anesthesia Type: General Level of consciousness: awake and alert Pain management: pain level controlled Vital Signs Assessment: post-procedure vital signs reviewed and stable Respiratory status: spontaneous breathing, nonlabored ventilation, respiratory function stable and patient connected to nasal cannula oxygen Cardiovascular status: blood pressure returned to baseline and stable Postop Assessment: no signs of nausea or vomiting Anesthetic complications: no    Last Vitals:  Filed Vitals:   05/10/15 1240 05/10/15 1250  BP: 116/78 125/70  Pulse: 62 62  Temp:    Resp: 16 13    Last Pain:  Filed Vitals:   05/10/15 1251  PainSc: 0-No pain                 Nikos Anglemyer S

## 2015-05-10 NOTE — Discharge Summary (Signed)
Discharge Summary    Patient ID: [name],  MRN: [id number], DOB/AGE: 72-26-1947 72 y.o.  Admit date: 05/09/2015 Discharge date: 05/10/2015  Primary Care Provider: O'SULLIVAN,MELISSA S. Primary Cardiologist: Dr. [name]   Discharge Diagnoses   Active Problems:   Atrial fibrillation with RVR ([place])  Allergies No Known Allergies  Diagnostic Studies/Procedures    Cardioversion 05/10/15 _____________   History of Present Illness   [name] is a 70 year old male with pmh of CAD, s/p CABG, CVA, OSA, PAF, HL, HTN, DM, Diastolic CHF with EF [id number] with grade 2 DD in 2014.   He presented to the Afib clinic on 05/09/15 after a visit with his PCP earlier that am in which he was found to be in Afib RVR with rate of 150-160.  He had successful DCCV on 04/27/15 and was d/c'd home, but did not take Cardizem for several days. He had not missed any Coumadin and his INR was therapeutic upon admission.     Hospital Course     Consultants: None  Mr. Quiros was admitted on 05/09/15 for Afib RVR and was placed on Amiodarone drip.  Dr. Aundra Dubin reviewed all records and patient had therapeutic INR for one month so he was able to preform DCCV without TEE. He was successfully cardioverted on 05/10/15. He was switched to po Amiodarone and will continue at home.   _____________  Discharge Vitals Blood pressure 122/86, pulse 84, temperature 98.3 F (36.8 C), temperature source Oral, resp. rate 18, height 6\' 3"  (1.905 m), weight 254 lb 1.6 oz (115.259 kg), SpO2 100 %.  Filed Weights   05/09/15 1342 05/10/15 0326  Weight: 253 lb (114.76 kg) 254 lb 1.6 oz (115.259 kg)    Labs & Radiologic Studies     CBC  Recent Labs  05/09/15 1233  WBC 8.1  HGB 14.7  HCT 45.2  MCV 90.2  PLT 123456   Basic Metabolic Panel  Recent Labs  05/09/15 1426 05/10/15 0445  NA 140 142  K 4.3 3.7  CL 103 103  CO2 27 30  GLUCOSE 107* 102*  BUN 19 17  CREATININE 1.14 1.21  CALCIUM 9.5 9.0  MG 2.1  --     Liver Function Tests  Recent Labs  05/09/15 1426  AST 36  ALT 36  ALKPHOS 56  BILITOT 0.6  PROT 6.8  ALBUMIN 3.9   Thyroid Function Tests  Recent Labs  05/09/15 1355  TSH 1.871      Disposition   Pt is being discharged home today in good condition.   Follow-up Plans & Appointments  Patient will follow up with Dr. Aundra Dubin on 05/21/15 and will see his PCP on 06/04/15.   Follow-up Information    Follow up with [name], MD On 05/21/2015.   Specialty:  Cardiology   Why:  at 1:15 for INR check, then 1:45 appt. with Dr. Lavell Islam information:   [id number] [place] [address] [phone]       Follow up with [name]., NP On 06/04/2015.   Specialty:  Internal Medicine   Why:  at 8:30am   Contact information:   [place] [address] [phone]        Discharge Medications   Current Discharge Medication List    START taking these medications   Details  amiodarone (PACERONE) 200 MG tablet Take 1-2 tablets (200-400 mg total) by mouth daily. 2  tabs (400mg )BID x 5 days, then one tab(200mg ) BID x 10 days.Then,1 tab (200 mg) qd Qty: 55 tablet, Refills: 3    metoprolol succinate (TOPROL-XL) 25 MG 24 hr tablet Take 1 tablet (25 mg total) by mouth daily. Qty: 30 tablet, Refills: 3      CONTINUE these medications which have NOT CHANGED   Details  allopurinol (ZYLOPRIM) 100 MG tablet TAKE 2 TABLETS BY MOUTH DAILY. D/C PREVIOUS SCRIPTS FOR THIS MEDICATION Qty: 180 tablet, Refills: 1    atorvastatin (LIPITOR) 40 MG tablet Take 2 tablets (80 mg total) by mouth daily. Qty: 180 tablet, Refills: 1    benazepril (LOTENSIN) 10 MG tablet TAKE 1 TABLET BY MOUTH DAILY D/C PREVIOUS SCRIPTS FOR THIS MEDICATION Qty: 90 tablet, Refills: 1    docusate sodium (COLACE) 100 MG capsule Take 1 capsule (100 mg total) by mouth 2 (two) times daily. Qty: 10 capsule, Refills: 0    fluticasone (FLONASE) 50  MCG/ACT nasal spray Place 2 sprays into both nostrils daily. Qty: 9.9 g, Refills: 1    furosemide (LASIX) 40 MG tablet TAKE 1 TABLET BY MOUTH EVERY OTHER DAY Qty: 45 tablet, Refills: 1    levothyroxine (SYNTHROID, LEVOTHROID) 112 MCG tablet TAKE 1 TABLET (112 MCG TOTAL) BY MOUTH DAILY BEFORE BREAKFAST. Qty: 90 tablet, Refills: 1    metFORMIN (GLUCOPHAGE) 500 MG tablet TAKE 1 TABLET EVERY DAY WITH BREAKFAST Qty: 90 tablet, Refills: 0    Multiple Vitamins-Minerals (MULTIVITAMIN GUMMIES MENS PO) Take 1 tablet by mouth daily.    nitroGLYCERIN (NITROSTAT) 0.4 MG SL tablet Place 1 tablet (0.4 mg total) under the tongue every 5 (five) minutes as needed for chest pain. Up to 3 doses Qty: 10 tablet, Refills: 2    pantoprazole (PROTONIX) 40 MG tablet Take 1 tablet (40 mg total) by mouth daily. Qty: 90 tablet, Refills: 1    traMADol (ULTRAM) 50 MG tablet Take 1 tablet (50 mg total) by mouth every 6 (six) hours as needed. Qty: 180 tablet, Refills: 1    traMADol (ULTRAM-ER) 200 MG 24 hr tablet TAKE 1 TABLET EVERY DAY WITH AM DOSE OF TRAMADOL 50 MG Qty: 90 tablet, Refills: 1    vitamin B-12 1000 MCG tablet Take 1 tablet (1,000 mcg total) by mouth daily. Qty: 30 tablet, Refills: 0    warfarin (COUMADIN) 2.5 MG tablet TAKE AS DIRECTED BY ANTICOAGULATION CLINIC Qty: 100 tablet, Refills: 0      STOP taking these medications     diltiazem (CARDIZEM CD) 180 MG 24 hr capsule             Outstanding Labs/Studies  None  Duration of Discharge Encounter   Greater than 30 minutes including physician time.  Signed, Arbutus Leas NP 05/10/2015, 4:53 PM

## 2015-05-10 NOTE — H&P (View-Only) (Signed)
Advanced Heart Failure Rounding Note   Subjective:    Admitted from A fib clinic with A fib RVR. Started on amio drip.   Denies SOB.   Objective:   Weight Range:  Vital Signs:   Temp:  [97.3 F (36.3 C)-98.3 F (36.8 C)] 97.3 F (36.3 C) (02/23 0737) Pulse Rate:  [46-113] 70 (02/23 0737) Resp:  [13-16] 16 (02/23 0737) BP: (97-136)/(49-91) 121/83 mmHg (02/23 0737) SpO2:  [91 %-100 %] 100 % (02/23 0737) Weight:  [253 lb (114.76 kg)-254 lb 1.6 oz (115.259 kg)] 254 lb 1.6 oz (115.259 kg) (02/23 0326) Last BM Date: 05/09/15  Weight change: Filed Weights   05/09/15 1342 05/10/15 0326  Weight: 253 lb (114.76 kg) 254 lb 1.6 oz (115.259 kg)    Intake/Output:   Intake/Output Summary (Last 24 hours) at 05/10/15 0933 Last data filed at 05/10/15 0900  Gross per 24 hour  Intake 714.68 ml  Output      0 ml  Net 714.68 ml     Physical Exam: General:  Well appearing. No resp difficulty. In bed  HEENT: normal Neck: supple. JVP 5-6  . Carotids 2+ bilat; no bruits. No lymphadenopathy or thryomegaly appreciated. Cor: PMI nondisplaced. Irregular rate & rhythm. No rubs, gallops or murmurs. Lungs: clear Abdomen: soft, nontender, nondistended. No hepatosplenomegaly. No bruits or masses. Good bowel sounds. Extremities: no cyanosis, clubbing, rash, edema Neuro: alert & orientedx3, cranial nerves grossly intact. moves all 4 extremities w/o difficulty. Affect pleasant  Telemetry: A fib 70s   Labs: Basic Metabolic Panel:  Recent Labs Lab 05/09/15 1233 05/09/15 1426 05/10/15 0445  NA 142 140 142  K 4.3 4.3 3.7  CL 104 103 103  CO2 28 27 30   GLUCOSE 114* 107* 102*  BUN 20 19 17   CREATININE 1.16 1.14 1.21  CALCIUM 9.7 9.5 9.0  MG  --  2.1  --     Liver Function Tests:  Recent Labs Lab 05/09/15 1426  AST 36  ALT 36  ALKPHOS 56  BILITOT 0.6  PROT 6.8  ALBUMIN 3.9   No results for input(s): LIPASE, AMYLASE in the last 168 hours. No results for input(s): AMMONIA  in the last 168 hours.  CBC:  Recent Labs Lab 05/09/15 1233  WBC 8.1  HGB 14.7  HCT 45.2  MCV 90.2  PLT 203    Cardiac Enzymes: No results for input(s): CKTOTAL, CKMB, CKMBINDEX, TROPONINI in the last 168 hours.  BNP: BNP (last 3 results)  Recent Labs  05/09/15 1230  BNP 171.3*    ProBNP (last 3 results) No results for input(s): PROBNP in the last 8760 hours.    Other results:  Imaging:  No results found.   Medications:     Scheduled Medications: . allopurinol  200 mg Oral Daily  . atorvastatin  80 mg Oral Daily  . benazepril  10 mg Oral Daily  . docusate sodium  100 mg Oral BID  . furosemide  40 mg Oral QODAY  . levothyroxine  112 mcg Oral QAC breakfast  . metFORMIN  500 mg Oral Q breakfast  . pantoprazole  40 mg Oral Daily  . sodium chloride flush  3 mL Intravenous Q12H  . warfarin  2.5 mg Oral q1800  . Warfarin - Pharmacist Dosing Inpatient   Does not apply q1800     Infusions: . amiodarone 30 mg/hr (05/10/15 0107)     PRN Medications:  sodium chloride, acetaminophen, alum & mag hydroxide-simeth, ondansetron (ZOFRAN) IV, sodium chloride  flush, sodium chloride flush, traMADol   Assessment/Plan/Discussion:   1. Atrial fibrillation: Recurrent atrial fibrillation with RVR. He has a history of PAF and CVA in the past. Was admitted with afib/RVR earlier in 2/17 and cardioverted to NSR. He went back into atrial fibrillation with RVR earlier this week. He developed significant exertional dyspnea to the point where it is hard to move around the house. Seen at PCP's office, HR 150s in afib. Diltiazem CD did not slow rate. He therefore needed admission for IV rate control and cardioversion. CHADSVASC = 7.  - Today he remains in A fib with controlled rate. Continue amio load. Plan for DC-CV today. INR has been therapeutic for the last month.  - Can use Toprol XL 25 daily rather than diltiazem CD given mildly decreased EF.  2. CAD: S/p CABG. No  chest pain. Continue statin. No ASA given warfarin use.  3. Chronic diastolic CHF: Volume status stable. Continue home Lasix. EF 50-55% on recent echo.   Length of Stay: 1  Amy Clegg NP-C  05/10/2015, 9:33 AM  Advanced Heart Failure Team Pager [phone] (M-F; 7a - 4p)  Please contact [facility] for night-coverage after hours (4p -7a ) and weekends on [email or web address]  Patient seen with NP, agree with the above note.  He feels better this morning, rate-controlled on amiodarone.  As noted before, I think this is going to be the best agent for him to maintain NSR.  We are trying to get DCCV scheduled today.  Will not need TEE given therapeutic INR x > 1 month .  [name] 05/10/2015 10:05 AM

## 2015-05-10 NOTE — Progress Notes (Addendum)
Advanced Heart Failure Rounding Note   Subjective:    Admitted from A fib clinic with A fib RVR. Started on amio drip.   Denies SOB.   Objective:   Weight Range:  Vital Signs:   Temp:  [97.3 F (36.3 C)-98.3 F (36.8 C)] 97.3 F (36.3 C) (02/23 0737) Pulse Rate:  [46-113] 70 (02/23 0737) Resp:  [13-16] 16 (02/23 0737) BP: (97-136)/(49-91) 121/83 mmHg (02/23 0737) SpO2:  [91 %-100 %] 100 % (02/23 0737) Weight:  [253 lb (114.76 kg)-254 lb 1.6 oz (115.259 kg)] 254 lb 1.6 oz (115.259 kg) (02/23 0326) Last BM Date: 05/09/15  Weight change: Filed Weights   05/09/15 1342 05/10/15 0326  Weight: 253 lb (114.76 kg) 254 lb 1.6 oz (115.259 kg)    Intake/Output:   Intake/Output Summary (Last 24 hours) at 05/10/15 0933 Last data filed at 05/10/15 0900  Gross per 24 hour  Intake 714.68 ml  Output      0 ml  Net 714.68 ml     Physical Exam: General:  Well appearing. No resp difficulty. In bed  HEENT: normal Neck: supple. JVP 5-6  . Carotids 2+ bilat; no bruits. No lymphadenopathy or thryomegaly appreciated. Cor: PMI nondisplaced. Irregular rate & rhythm. No rubs, gallops or murmurs. Lungs: clear Abdomen: soft, nontender, nondistended. No hepatosplenomegaly. No bruits or masses. Good bowel sounds. Extremities: no cyanosis, clubbing, rash, edema Neuro: alert & orientedx3, cranial nerves grossly intact. moves all 4 extremities w/o difficulty. Affect pleasant  Telemetry: A fib 70s   Labs: Basic Metabolic Panel:  Recent Labs Lab 05/09/15 1233 05/09/15 1426 05/10/15 0445  NA 142 140 142  K 4.3 4.3 3.7  CL 104 103 103  CO2 28 27 30   GLUCOSE 114* 107* 102*  BUN 20 19 17   CREATININE 1.16 1.14 1.21  CALCIUM 9.7 9.5 9.0  MG  --  2.1  --     Liver Function Tests:  Recent Labs Lab 05/09/15 1426  AST 36  ALT 36  ALKPHOS 56  BILITOT 0.6  PROT 6.8  ALBUMIN 3.9   No results for input(s): LIPASE, AMYLASE in the last 168 hours. No results for input(s): AMMONIA  in the last 168 hours.  CBC:  Recent Labs Lab 05/09/15 1233  WBC 8.1  HGB 14.7  HCT 45.2  MCV 90.2  PLT 203    Cardiac Enzymes: No results for input(s): CKTOTAL, CKMB, CKMBINDEX, TROPONINI in the last 168 hours.  BNP: BNP (last 3 results)  Recent Labs  05/09/15 1230  BNP 171.3*    ProBNP (last 3 results) No results for input(s): PROBNP in the last 8760 hours.    Other results:  Imaging:  No results found.   Medications:     Scheduled Medications: . allopurinol  200 mg Oral Daily  . atorvastatin  80 mg Oral Daily  . benazepril  10 mg Oral Daily  . docusate sodium  100 mg Oral BID  . furosemide  40 mg Oral QODAY  . levothyroxine  112 mcg Oral QAC breakfast  . metFORMIN  500 mg Oral Q breakfast  . pantoprazole  40 mg Oral Daily  . sodium chloride flush  3 mL Intravenous Q12H  . warfarin  2.5 mg Oral q1800  . Warfarin - Pharmacist Dosing Inpatient   Does not apply q1800     Infusions: . amiodarone 30 mg/hr (05/10/15 0107)     PRN Medications:  sodium chloride, acetaminophen, alum & mag hydroxide-simeth, ondansetron (ZOFRAN) IV, sodium chloride  flush, sodium chloride flush, traMADol   Assessment/Plan/Discussion:   1. Atrial fibrillation: Recurrent atrial fibrillation with RVR. He has a history of PAF and CVA in the past. Was admitted with afib/RVR earlier in 2/17 and cardioverted to NSR. He went back into atrial fibrillation with RVR earlier this week. He developed significant exertional dyspnea to the point where it is hard to move around the house. Seen at PCP's office, HR 150s in afib. Diltiazem CD did not slow rate. He therefore needed admission for IV rate control and cardioversion. CHADSVASC = 7.  - Today he remains in A fib with controlled rate. Continue amio load. Plan for DC-CV today. INR has been therapeutic for the last month.  - Can use Toprol XL 25 daily rather than diltiazem CD given mildly decreased EF.  2. CAD: S/p CABG. No  chest pain. Continue statin. No ASA given warfarin use.  3. Chronic diastolic CHF: Volume status stable. Continue home Lasix. EF 50-55% on recent echo.   Length of Stay: 1  Amy Clegg NP-C  05/10/2015, 9:33 AM  Advanced Heart Failure Team Pager [phone] (M-F; 7a - 4p)  Please contact [facility] for night-coverage after hours (4p -7a ) and weekends on [email or web address]  Patient seen with NP, agree with the above note.  He feels better this morning, rate-controlled on amiodarone.  As noted before, I think this is going to be the best agent for him to maintain NSR.  We are trying to get DCCV scheduled today.  Will not need TEE given therapeutic INR x > 1 month .  [name] 05/10/2015 10:05 AM  Successful DCCV.  He can go home this evening if stable on amiodarone 400 mg bid x 5 days then 200 mg bid x 10 days then 200 daily.  Would send home on Toprol XL 25 daily rather than diltiazem cd.   [name] 05/10/2015

## 2015-05-10 NOTE — Anesthesia Preprocedure Evaluation (Addendum)
Anesthesia Evaluation  Patient identified by MRN, date of birth, ID band Patient awake    Reviewed: Allergy & Precautions, NPO status , Patient's Chart, lab work & pertinent test results  Airway Mallampati: II  TM Distance: >3 FB Neck ROM: Full    Dental no notable dental hx.    Pulmonary sleep apnea , former smoker,    Pulmonary exam normal breath sounds clear to auscultation       Cardiovascular hypertension, + CAD, + CABG and +CHF  + dysrhythmias Atrial Fibrillation  Rhythm:Irregular Rate:Normal  Left ventricle: The cavity size was normal. There was mild concentric hypertrophy. Systolic function was normal. The estimated ejection fraction was in the range of 50% to 55%. Wall motion was normal; there were no regional wall motion abnormalities. Left ventricular diastolic function parameters were normal. - Aortic valve: Trileaflet; mildly thickened, mildly calcified leaflets. - Left atrium: The atrium was mildly dilated. - Tricuspid valve: There was moderate regurgitation.    Neuro/Psych CVA negative psych ROS   GI/Hepatic negative GI ROS, Neg liver ROS,   Endo/Other  diabetesHypothyroidism   Renal/GU negative Renal ROS  negative genitourinary   Musculoskeletal negative musculoskeletal ROS (+)   Abdominal   Peds negative pediatric ROS (+)  Hematology negative hematology ROS (+)   Anesthesia Other Findings   Reproductive/Obstetrics negative OB ROS                            Anesthesia Physical Anesthesia Plan  ASA: III  Anesthesia Plan: General   Post-op Pain Management:    Induction: Intravenous  Airway Management Planned: Mask  Additional Equipment:   Intra-op Plan:   Post-operative Plan:   Informed Consent: I have reviewed the patients History and Physical, chart, labs and discussed the procedure including the risks, benefits and alternatives for the  proposed anesthesia with the patient or authorized representative who has indicated his/her understanding and acceptance.   Dental advisory given  Plan Discussed with: CRNA and Surgeon  Anesthesia Plan Comments:         Anesthesia Quick Evaluation

## 2015-05-10 NOTE — Transfer of Care (Signed)
Immediate Anesthesia Transfer of Care Note  Patient: [name]  Procedure(s) Performed: Procedure(s): CARDIOVERSION (N/A)  Patient Location: PACU  Anesthesia Type:MAC  Level of Consciousness: awake  Airway & Oxygen Therapy: Patient Spontanous Breathing  Post-op Assessment: Report given to RN and Post -op Vital signs reviewed and stable  Post vital signs: stable  Last Vitals:  Filed Vitals:   05/10/15 1201 05/10/15 1238  BP: 142/102 110/70  Pulse: 82 60  Temp: 36.8 C   Resp: 12 15    Complications: No apparent anesthesia complications

## 2015-05-10 NOTE — Progress Notes (Signed)
ANTICOAGULATION CONSULT NOTE  Pharmacy Consult for warfarin Indication: atrial fibrillation  No Known Allergies  Patient Measurements: Height: 6\' 3"  (190.5 cm) Weight: 254 lb 1.6 oz (115.259 kg) IBW/kg (Calculated) : 84.5  Vital Signs: Temp: 97.3 F (36.3 C) (02/23 0737) Temp Source: Oral (02/23 0737) BP: 121/83 mmHg (02/23 0737) Pulse Rate: 70 (02/23 0737)  Labs:  Recent Labs  05/09/15 1233 05/09/15 1426 05/10/15 0445  HGB 14.7  --   --   HCT 45.2  --   --   PLT 203  --   --   LABPROT 22.8*  --  25.3*  INR 2.02*  --  2.33*  CREATININE 1.16 1.14 1.21    Estimated Creatinine Clearance: 76.7 mL/min (by C-G formula based on Cr of 1.21).   Medical History: Past Medical History  Diagnosis Date  . GERD (gastroesophageal reflux disease)   . Gout   . Hyperlipidemia   . Hypertension   . CVA (cerebral infarction)     left hemiparesis  . Chronic pain     left sided-[place]  . CAD (coronary artery disease)     s/p CABG, s/p DES to LCX  January 2011  . OSA on CPAP   . Sleep apnea   . History of colonoscopy   . Stroke ([place])   . Dysrhythmia     hx of atrial fibrilation with cardioversion  . Hypothyroidism   . Diastolic CHF [facility])     Assessment: 70 y.o. male with a h/o CAD, s/p bypass, prior stroke,OSA, untreated, PAF, that developed persistent afib and was admitted with RVR 2/7 and d/ced 2/10 after successful DCCV.  Seen in afib clinic today, back in afib with rvr HR 150-160s, symptomatic.  Today's INR at goal at 2.3, cbc within normal limits yesterday.  Patient was last seen in the coumadin clinic on 2/3 with INR of 2.7 on 2.5 mg daily.  Will need to watch INR closely with the new addition of amiodarone. No other drug interactions noted with the warfarin or amio.  Goal of Therapy:  INR 2-3 Monitor platelets by anticoagulation protocol: Yes   Plan:  Continue warfarin 2.5mg  daily Daily INR for now  Uvaldo Rising, BCPS  Clinical  Pharmacist Pager [phone]  05/10/2015 9:34 AM

## 2015-05-10 NOTE — Interval H&P Note (Signed)
History and Physical Interval Note:  05/10/2015 12:26 PM  [name]  has presented today for surgery, with the diagnosis of a fib  The various methods of treatment have been discussed with the patient and family. After consideration of risks, benefits and other options for treatment, the patient has consented to  Procedure(s): CARDIOVERSION (N/A) as a surgical intervention .  The patient's history has been reviewed, patient examined, no change in status, stable for surgery.  I have reviewed the patient's chart and labs.  Questions were answered to the patient's satisfaction.     Kollins Fenter [employer]

## 2015-05-11 ENCOUNTER — Encounter (HOSPITAL_COMMUNITY): Payer: Self-pay | Admitting: Cardiology

## 2015-05-14 ENCOUNTER — Telehealth: Payer: Self-pay | Admitting: Behavioral Health

## 2015-05-14 NOTE — Telephone Encounter (Signed)
Attempted to reach patient for TCM/Hospital Follow-up. Left message for patient to return call when available.    

## 2015-05-14 NOTE — Procedures (Signed)
Electrical Cardioversion Procedure Note [name] [id number] February 24, 1946  Procedure: Electrical Cardioversion Indications:  Atrial Fibrillation  Procedure Details Consent: Risks of procedure as well as the alternatives and risks of each were explained to the (patient/caregiver).  Consent for procedure obtained. Time Out: Verified patient identification, verified procedure, site/side was marked, verified correct patient position, special equipment/implants available, medications/allergies/relevent history reviewed, required imaging and test results available.  Performed  Patient placed on cardiac monitor, pulse oximetry, supplemental oxygen as necessary.  Sedation given: Propofol per anesthesiology Pacer pads placed anterior and posterior chest.  Cardioverted 1 time(s).  Cardioverted at [place].  Evaluation Findings: Post procedure EKG shows: NSR Complications: None Patient did tolerate procedure well.   [name] 05/14/2015, 3:46 PM

## 2015-05-16 ENCOUNTER — Telehealth: Payer: Self-pay | Admitting: Family

## 2015-05-16 NOTE — Telephone Encounter (Signed)
He should ask his cardiologist since they are managing his coumadin.

## 2015-05-16 NOTE — Telephone Encounter (Signed)
Notified pt and he voices understanding. 

## 2015-05-16 NOTE — Telephone Encounter (Signed)
Caller name: Enrrique   Relationship to patient: Self  Can be reached: [phone]  Reason for call: Pt is requesting a call back. He wouldn't say what call is regarding .

## 2015-05-16 NOTE — Telephone Encounter (Signed)
Spoke with pt. He is currently taking warfarin 2.5mg  every morning. He states he may need to have a tooth pulled and wants to know if his coumadin should be stopped before he has that done and if so, when should it be stopped and resumed?  Please advise.

## 2015-05-21 ENCOUNTER — Ambulatory Visit: Payer: Medicare Other | Admitting: Cardiology

## 2015-05-22 ENCOUNTER — Telehealth: Payer: Self-pay | Admitting: Family

## 2015-05-22 ENCOUNTER — Ambulatory Visit: Payer: Medicare Other | Admitting: Family

## 2015-05-24 NOTE — Telephone Encounter (Signed)
Talked to pt. He had work done on one tooth yesterday and had another tooth extracted today. He is hurting right now and didn't want to come right away. He scheduled for 3/17 30 min hosp f/u (but is also scheduled for a reg f/u 3/20).

## 2015-05-24 NOTE — Telephone Encounter (Signed)
No charge.  However please notify pt of missed appointment and try to reschedule.

## 2015-05-24 NOTE — Telephone Encounter (Signed)
Pt was no show 05/22/15 1:15pm for hospital follow up, pt has not rescheduled but has a scheduled appt for care gaps 06/04/15, charge or no charge?

## 2015-06-01 ENCOUNTER — Inpatient Hospital Stay: Payer: Medicare Other | Admitting: Family

## 2015-06-01 ENCOUNTER — Telehealth: Payer: Self-pay | Admitting: Family

## 2015-06-04 ENCOUNTER — Ambulatory Visit (INDEPENDENT_AMBULATORY_CARE_PROVIDER_SITE_OTHER): Payer: Medicare Other | Admitting: Family

## 2015-06-04 ENCOUNTER — Encounter: Payer: Self-pay | Admitting: Family

## 2015-06-04 ENCOUNTER — Telehealth: Payer: Self-pay | Admitting: Family

## 2015-06-04 VITALS — BP 140/88 | HR 50 | Temp 98.2°F | Resp 16 | Ht 75.0 in | Wt 259.0 lb

## 2015-06-04 DIAGNOSIS — E039 Hypothyroidism, unspecified: Secondary | ICD-10-CM

## 2015-06-04 DIAGNOSIS — E119 Type 2 diabetes mellitus without complications: Secondary | ICD-10-CM | POA: Diagnosis not present

## 2015-06-04 DIAGNOSIS — K219 Gastro-esophageal reflux disease without esophagitis: Secondary | ICD-10-CM

## 2015-06-04 DIAGNOSIS — E1310 Other specified diabetes mellitus with ketoacidosis without coma: Secondary | ICD-10-CM | POA: Diagnosis not present

## 2015-06-04 DIAGNOSIS — I10 Essential (primary) hypertension: Secondary | ICD-10-CM | POA: Diagnosis not present

## 2015-06-04 DIAGNOSIS — E1110 Type 2 diabetes mellitus with ketoacidosis without coma: Secondary | ICD-10-CM

## 2015-06-04 DIAGNOSIS — I480 Paroxysmal atrial fibrillation: Secondary | ICD-10-CM

## 2015-06-04 LAB — HEMOGLOBIN A1C: HEMOGLOBIN A1C: 6.5 % (ref 4.6–6.5)

## 2015-06-04 MED ORDER — METFORMIN HCL 500 MG PO TABS: ORAL_TABLET | ORAL | Status: DC

## 2015-06-04 MED ORDER — BENAZEPRIL HCL 10 MG PO TABS: ORAL_TABLET | ORAL | Status: DC

## 2015-06-04 MED ORDER — LEVOTHYROXINE SODIUM 112 MCG PO TABS: ORAL_TABLET | ORAL | Status: DC

## 2015-06-04 NOTE — Assessment & Plan Note (Signed)
Clinically stable on metformin, obtain follow up A1C.

## 2015-06-04 NOTE — Assessment & Plan Note (Signed)
S/p cardioversion.  Stable rate, has follow up with cardiology tomorrow.

## 2015-06-04 NOTE — Telephone Encounter (Signed)
Pt dropped off document to be filled out (Permanent Disability Parking Placard)

## 2015-06-04 NOTE — Assessment & Plan Note (Signed)
BP stable on current meds. Continue same.  

## 2015-06-04 NOTE — Telephone Encounter (Signed)
Pt was no show 06/01/15 9:30am, pt came in today 06/04/15, 2nd no show, charge or no charge?

## 2015-06-04 NOTE — Patient Instructions (Addendum)
Please complete lab work prior to leaving. Work on avoiding foods that can worsen your heartburn- see below. Follow up in 3 months.  Food Choices for Gastroesophageal Reflux Disease, Adult When you have gastroesophageal reflux disease (GERD), the foods you eat and your eating habits are very important. Choosing the right foods can help ease the discomfort of GERD. WHAT GENERAL GUIDELINES DO I NEED TO FOLLOW?  Choose fruits, vegetables, whole grains, low-fat dairy products, and low-fat meat, fish, and poultry.  Limit fats such as oils, salad dressings, butter, nuts, and avocado.  Keep a food diary to identify foods that cause symptoms.  Avoid foods that cause reflux. These may be different for different people.  Eat frequent small meals instead of three large meals each day.  Eat your meals slowly, in a relaxed setting.  Limit fried foods.  Cook foods using methods other than frying.  Avoid drinking alcohol.  Avoid drinking large amounts of liquids with your meals.  Avoid bending over or lying down until 2-3 hours after eating. WHAT FOODS ARE NOT RECOMMENDED? The following are some foods and drinks that may worsen your symptoms: Vegetables Tomatoes. Tomato juice. Tomato and spaghetti sauce. Chili peppers. Onion and garlic. Horseradish. Fruits Oranges, grapefruit, and lemon (fruit and juice). Meats High-fat meats, fish, and poultry. This includes hot dogs, ribs, ham, sausage, salami, and bacon. Dairy Whole milk and chocolate milk. Sour cream. Cream. Butter. Ice cream. Cream cheese.  Beverages Coffee and tea, with or without caffeine. Carbonated beverages or energy drinks. Condiments Hot sauce. Barbecue sauce.  Sweets/Desserts Chocolate and cocoa. Donuts. Peppermint and spearmint. Fats and Oils High-fat foods, including [place] fries and potato chips. Other Vinegar. Strong spices, such as black pepper, white pepper, red pepper, cayenne, curry powder, cloves, ginger, and  chili powder. The items listed above may not be a complete list of foods and beverages to avoid. Contact your dietitian for more information.   This information is not intended to replace advice given to you by your health care provider. Make sure you discuss any questions you have with your health care provider.   Document Released: 03/03/2005 Document Revised: 03/24/2014 Document Reviewed: 01/05/2013 Elsevier Interactive Patient Education [employer].

## 2015-06-04 NOTE — Telephone Encounter (Signed)
No charge. 

## 2015-06-04 NOTE — Telephone Encounter (Signed)
Received Renewal of Disability Parking Placard, complete as much as possible; forwarded to provider/SLS 03/20

## 2015-06-04 NOTE — Progress Notes (Signed)
Subjective:    Patient ID: [name], male    DOB: 26-Aug-1945, 70 y.o.   MRN: [id number]  HPI  [name] is a 70 yr old male who presents today for follow up.  1) DM2- maintained on metformin. He does not check sugars  Lab Results  Component Value Date   HGBA1C 6.3 03/05/2015   HGBA1C 6.5 09/26/2014   HGBA1C 6.6* 06/23/2014   Lab Results  Component Value Date   MICROALBUR <0.7 09/26/2014   LDLCALC 70 01/15/2015   CREATININE 1.21 05/10/2015   2) Hypothyroid- on synthroid 137mcg. Lab Results  Component Value Date   TSH 1.871 05/09/2015   3) HTN- maintained on lotensin, toprol xl.  BP Readings from Last 3 Encounters:  06/04/15 140/88  05/10/15 128/84  05/09/15 132/78   4) GERD- maintained on protonix.  Reports some heartburn symptoms before bed.    5)  AF-  Pt was admitted on 05/10/15 with AF RVR and was placed on amiodarone drip.  He was successfully cardioverted, switch to PO amiodarone/metoprolol and discharged home. PO diltiazem was d/c'd during his hospitalization. He is maintained on coumadin and this is being monitored by the coumadin clinic.   Review of Systems  Respiratory: Negative for cough and shortness of breath.   Cardiovascular: Negative for palpitations.   See HPI  Past Medical History  Diagnosis Date  . GERD (gastroesophageal reflux disease)   . Gout   . Hyperlipidemia   . Hypertension   . CVA (cerebral infarction)     left hemiparesis  . Chronic pain     left sided-[name]  . CAD (coronary artery disease)     s/p CABG, s/p DES to LCX  January 2011  . OSA on CPAP   . Sleep apnea   . History of colonoscopy   . Stroke ([name])   . Dysrhythmia     hx of atrial fibrilation with cardioversion  . Hypothyroidism   . Diastolic CHF [name])     Social History   Social History  . Marital Status: Married    Spouse Name: [name]  . Number of Children: N/A  . Years of Education: N/A   Occupational History  . retired [name]   Social History Main Topics  . Smoking status: Former Research scientist (life sciences)  . Smokeless tobacco: Never Used  . Alcohol Use: No     Comment: 1 beer a month  . Drug Use: No  . Sexual Activity: No   Other Topics Concern  . Not on file   Social History Narrative   Pt lives with wife. Does have stairs, but patient doesn't use them. Pt has completed technical school    Past Surgical History  Procedure Laterality Date  . Coronary artery bypass graft      stent  . Esophagogastroduodenoscopy  03-25-2005  . Nasal septum surgery    . Percutaneous placement intravascular stent cervical carotid artery      03-2009; using a drug-eluting platform of the circumflex cornoray artery with a 3.0 x 18 [employer] Promus drug-eluting platform post dilated to 3.75 with a noncompliant balloon.  . Cataract extraction  05/2010    left eye  . Cataract extraction  04/2010    right eye  . Cardioversion  06/20/2011    Procedure: CARDIOVERSION;  Surgeon: Larey Dresser, MD;  Location: [name] ENDOSCOPY;  Service: Cardiovascular;  Laterality: N/A;  . Tee without cardioversion  06/20/2011    Procedure: TRANSESOPHAGEAL ECHOCARDIOGRAM (  TEE);  Surgeon: Larey Dresser, MD;  Location: [facility];  Service: Cardiovascular;  Laterality: N/A;  . Cardioversion N/A 04/26/2015    Procedure: CARDIOVERSION;  Surgeon: Dorothy Spark, MD;  Location: [place] [facility];  Service: Cardiovascular;  Laterality: N/A;  . Cardioversion N/A 05/10/2015    Procedure: CARDIOVERSION;  Surgeon: Larey Dresser, MD;  Location: [facility] ENDOSCOPY;  Service: Cardiovascular;  Laterality: N/A;    Family History  Problem Relation Age of Onset  . Lung cancer Father     deceased  . Stroke Mother     deceased-[place]ISTROKES    No Known Allergies  Current Outpatient Prescriptions on File Prior to Visit  Medication Sig Dispense Refill  . allopurinol (ZYLOPRIM) 100 MG tablet TAKE 2 TABLETS BY MOUTH DAILY. D/C PREVIOUS SCRIPTS FOR THIS MEDICATION 180 tablet 1    . amiodarone (PACERONE) 200 MG tablet Take 1-2 tablets (200-400 mg total) by mouth daily. 2 tabs (400mg )BID x 5 days, then one tab(200mg ) BID x 10 days.Then,1 tab (200 mg) qd (Patient taking differently: Take 200 mg by mouth daily. ) 55 tablet 3  . atorvastatin (LIPITOR) 40 MG tablet Take 2 tablets (80 mg total) by mouth daily. (Patient taking differently: Take 80 mg by mouth daily at 6 PM. ) 180 tablet 1  . benazepril (LOTENSIN) 10 MG tablet TAKE 1 TABLET BY MOUTH DAILY D/C PREVIOUS SCRIPTS FOR THIS MEDICATION (Patient taking differently: Take 10 mg by mouth daily at bedtime) 90 tablet 1  . docusate sodium (COLACE) 100 MG capsule Take 1 capsule (100 mg total) by mouth 2 (two) times daily. 10 capsule 0  . fluticasone (FLONASE) 50 MCG/ACT nasal spray Place 2 sprays into both nostrils daily. 9.9 g 1  . furosemide (LASIX) 40 MG tablet TAKE 1 TABLET BY MOUTH EVERY OTHER DAY 45 tablet 1  . levothyroxine (SYNTHROID, LEVOTHROID) 112 MCG tablet TAKE 1 TABLET (112 MCG TOTAL) BY MOUTH DAILY BEFORE BREAKFAST. 90 tablet 1  . metFORMIN (GLUCOPHAGE) 500 MG tablet TAKE 1 TABLET EVERY DAY WITH BREAKFAST 90 tablet 0  . Multiple Vitamins-Minerals (MULTIVITAMIN GUMMIES MENS PO) Take 1 tablet by mouth daily.    . nitroGLYCERIN (NITROSTAT) 0.4 MG SL tablet Place 1 tablet (0.4 mg total) under the tongue every 5 (five) minutes as needed for chest pain. Up to 3 doses 10 tablet 2  . pantoprazole (PROTONIX) 40 MG tablet Take 1 tablet (40 mg total) by mouth daily. 90 tablet 1  . traMADol (ULTRAM) 50 MG tablet Take 1 tablet (50 mg total) by mouth every 6 (six) hours as needed. (Patient taking differently: Take 50 mg by mouth 2 (two) times daily. ) 180 tablet 1  . traMADol (ULTRAM-ER) 200 MG 24 hr tablet TAKE 1 TABLET EVERY DAY WITH AM DOSE OF TRAMADOL 50 MG 90 tablet 1  . vitamin B-12 1000 MCG tablet Take 1 tablet (1,000 mcg total) by mouth daily. 30 tablet 0  . warfarin (COUMADIN) 2.5 MG tablet TAKE AS DIRECTED BY  ANTICOAGULATION CLINIC (Patient taking differently: Take 2.5 mg by mouth daily in the morning.) 100 tablet 0  . metoprolol succinate (TOPROL-XL) 25 MG 24 hr tablet Take 1 tablet (25 mg total) by mouth daily. 30 tablet 3   No current facility-administered medications on file prior to visit.    BP 140/88 mmHg  Pulse 50  Temp(Src) 98.2 F (36.8 C) (Oral)  Resp 16  Ht 6\' 3"  (1.905 m)  Wt 259 lb (117.482 kg)  BMI 32.37 kg/m2  SpO2 97%  Objective:   Physical Exam  Constitutional: He is oriented to person, place, and time. He appears well-developed and well-nourished. No distress.  HENT:  Head: Normocephalic and atraumatic.  Cardiovascular: Normal rate and regular rhythm.   No murmur heard. Pulmonary/Chest: Effort normal and breath sounds normal. No respiratory distress. He has no wheezes. He has no rales.  Musculoskeletal:  Trace bilateral LE edema  Neurological: He is alert and oriented to person, place, and time.  Skin: Skin is warm and dry.  Psychiatric: He has a normal mood and affect. His behavior is normal. Thought content normal.          Assessment & Plan:

## 2015-06-04 NOTE — Progress Notes (Signed)
Pre visit review using our clinic review tool, if applicable. No additional management support is needed unless otherwise documented below in the visit note. 

## 2015-06-04 NOTE — Assessment & Plan Note (Signed)
TSH stable, continue synthroid ° °

## 2015-06-04 NOTE — Assessment & Plan Note (Signed)
Continues to have some HS symptoms, discussed GERD diet, continue PPI.

## 2015-06-04 NOTE — Progress Notes (Signed)
Cardiology Office Note:    Date:  06/05/2015   ID:  [name], DOB 12-Jul-1945, MRN [id number]  PCP:  [name]., NP  Cardiologist:  Dr. [name]   Electrophysiologist:  n/a  Chief Complaint  Patient presents with  . Coronary Artery Disease    follow up  . Atrial Fibrillation    follow up    History of Present Illness:     [name] is a 70 y.o. male with a hx of CAD s/p CABG, PAF, prior CVA.  Myoview in 2013 was neg for ischemia.  He has residual gait difficulty 2/2 to prior CVA.  Last seen by Dr. [name] in 3/16.    Admitted in 2/17 from AFib Clinic with AF with RVR. He had undergone DCCV earlier in 2/17 for AF with RVR.  HR was in 150s.  He was placed on Amiodarone gtt.  He underwent DCCV with return of NSR and was DC home on Amiodarone.  Returns for FU.  Since DC from the hospital, he has been doing well. He has felt lightheaded. He denies syncope. Denies chest pain, dyspnea, chronic, PND or edema. He has chronic left leg edema related to weakness from stroke. Denies any bleeding issues. He has noted increased bruising. He's not had his INR checked since discharge from the hospital.   Past Medical History  Diagnosis Date  . GERD (gastroesophageal reflux disease)   . Gout   . Hyperlipidemia   . Hypertension   . CVA (cerebral infarction)     left hemiparesis  . Chronic pain     left sided-Kristeins  . CAD (coronary artery disease)     s/p CABG, s/p DES to LCX  January 2011  . OSA on CPAP   . Sleep apnea   . History of colonoscopy   . Stroke ([place])   . Dysrhythmia     hx of atrial fibrilation with cardioversion  . Hypothyroidism   . Diastolic CHF ([place])   1. GERD 2. Gout 3. Hypothyroidism 4. HTN 5. H/o CVA: some residual left-sided weakness.  6. Borderline DM2 7. CAD s/p CABG in 2004. LHC in 2011: SVG-RCA and SVG-OM totally occluded. LIMA-LAD patent. Patient had DES to CFX, flow down native RCA was ok. Lexiscan myoview  (8/13): EF 62%, small mild fixed inferior defect with no ischemia. Echo (9/14) with EF 55-60%, grade II diastolic dysfunction, PA systolic pressure 35 mmHg.  8. OSA: Intolerant of CPAP, wears oxygen at night.  9. Paroxysmal atrial fibrillation. TEE (4/13) with EF 55-60%, no significant valvular abnormalities.  10. Carotid dopplers (5/15) with minimal stenosis  Past Surgical History  Procedure Laterality Date  . Coronary artery bypass graft      stent  . Esophagogastroduodenoscopy  03-25-2005  . Nasal septum surgery    . Percutaneous placement intravascular stent cervical carotid artery      03-2009; using a drug-eluting platform of the circumflex cornoray artery with a 3.0 x 18 [employer] Promus drug-eluting platform post dilated to 3.75 with a noncompliant balloon.  . Cataract extraction  05/2010    left eye  . Cataract extraction  04/2010    right eye  . Cardioversion  06/20/2011    Procedure: CARDIOVERSION;  Surgeon: Larey Dresser, MD;  Location: [facility] ENDOSCOPY;  Service: Cardiovascular;  Laterality: N/A;  . Tee without cardioversion  06/20/2011    Procedure: TRANSESOPHAGEAL ECHOCARDIOGRAM (TEE);  Surgeon: Larey Dresser, MD;  Location: [place];  Service: Cardiovascular;  Laterality:  N/A;  . Cardioversion N/A 04/26/2015    Procedure: CARDIOVERSION;  Surgeon: Dorothy Spark, MD;  Location: [place];  Service: Cardiovascular;  Laterality: N/A;  . Cardioversion N/A 05/10/2015    Procedure: CARDIOVERSION;  Surgeon: Larey Dresser, MD;  Location: [facility] ENDOSCOPY;  Service: Cardiovascular;  Laterality: N/A;    Current Medications: Outpatient Prescriptions Prior to Visit  Medication Sig Dispense Refill  . allopurinol (ZYLOPRIM) 100 MG tablet TAKE 2 TABLETS BY MOUTH DAILY. D/C PREVIOUS SCRIPTS FOR THIS MEDICATION 180 tablet 1  . benazepril (LOTENSIN) 10 MG tablet TAKE 1 TABLET BY MOUTH DAILY D/C PREVIOUS SCRIPTS FOR THIS MEDICATION 90 tablet 1  . docusate sodium (COLACE) 100 MG  capsule Take 1 capsule (100 mg total) by mouth 2 (two) times daily. 10 capsule 0  . fluticasone (FLONASE) 50 MCG/ACT nasal spray Place 2 sprays into both nostrils daily. 9.9 g 1  . furosemide (LASIX) 40 MG tablet TAKE 1 TABLET BY MOUTH EVERY OTHER DAY 45 tablet 1  . levothyroxine (SYNTHROID, LEVOTHROID) 112 MCG tablet TAKE 1 TABLET (112 MCG TOTAL) BY MOUTH DAILY BEFORE BREAKFAST. 90 tablet 1  . metFORMIN (GLUCOPHAGE) 500 MG tablet TAKE 1 TABLET EVERY DAY WITH BREAKFAST 90 tablet 1  . Multiple Vitamins-Minerals (MULTIVITAMIN GUMMIES MENS PO) Take 1 tablet by mouth daily.    . nitroGLYCERIN (NITROSTAT) 0.4 MG SL tablet Place 1 tablet (0.4 mg total) under the tongue every 5 (five) minutes as needed for chest pain. Up to 3 doses 10 tablet 2  . pantoprazole (PROTONIX) 40 MG tablet Take 1 tablet (40 mg total) by mouth daily. 90 tablet 1  . traMADol (ULTRAM-ER) 200 MG 24 hr tablet TAKE 1 TABLET EVERY DAY WITH AM DOSE OF TRAMADOL 50 MG 90 tablet 1  . vitamin B-12 1000 MCG tablet Take 1 tablet (1,000 mcg total) by mouth daily. 30 tablet 0  . warfarin (COUMADIN) 2.5 MG tablet TAKE AS DIRECTED BY ANTICOAGULATION CLINIC (Patient taking differently: Take 2.5 mg by mouth daily in the morning.) 100 tablet 0  . atorvastatin (LIPITOR) 40 MG tablet Take 2 tablets (80 mg total) by mouth daily. (Patient taking differently: Take 80 mg by mouth daily at 6 PM. ) 180 tablet 1  . metoprolol succinate (TOPROL-XL) 25 MG 24 hr tablet Take 1 tablet (25 mg total) by mouth daily. 30 tablet 3  . traMADol (ULTRAM) 50 MG tablet Take 1 tablet (50 mg total) by mouth every 6 (six) hours as needed. (Patient taking differently: Take 50 mg by mouth 2 (two) times daily. ) 180 tablet 1  . amiodarone (PACERONE) 200 MG tablet Take 1-2 tablets (200-400 mg total) by mouth daily. 2 tabs (400mg )BID x 5 days, then one tab(200mg ) BID x 10 days.Then,1 tab (200 mg) qd (Patient not taking: Reported on 06/05/2015) 55 tablet 3   No facility-administered  medications prior to visit.     Allergies:   Review of patient's allergies indicates no known allergies.   Social History   Social History  . Marital Status: Married    Spouse Name: [name]  . Number of Children: N/A  . Years of Education: N/A   Occupational History  . retired Hydrographic surveyor   Social History Main Topics  . Smoking status: Former Research scientist (life sciences)  . Smokeless tobacco: Never Used  . Alcohol Use: No     Comment: 1 beer a month  . Drug Use: No  . Sexual Activity: No   Other Topics Concern  .  None   Social History Narrative   Pt lives with wife. Does have stairs, but patient doesn't use them. Pt has completed technical school     Family History:  The patient's family history includes Lung cancer in his father; Stroke in his mother.   ROS:   Please see the history of present illness.    Review of Systems  Hematologic/Lymphatic: Bruises/bleeds easily.  Neurological: Positive for dizziness.   All other systems reviewed and are negative.   Physical Exam:    VS:  BP 144/78 mmHg  Pulse 56  Ht 6\' 3"  (1.905 m)  Wt 254 lb 12.8 oz (115.577 kg)  BMI 31.85 kg/m2  SpO2 96%   GEN: Well nourished, well developed, in no acute distress HEENT: normal Neck: no JVD, no masses Cardiac: Normal S1/S2, RRR; no murmurs, rubs, or gallops, no edema    Respiratory:  clear to auscultation bilaterally; no wheezing, rhonchi or rales GI: soft, nontender, nondistended MS: no deformity or atrophy Skin: warm and dry Neuro: No focal deficits  Psych: Alert and oriented x 3, normal affect  Wt Readings from Last 3 Encounters:  06/05/15 254 lb 12.8 oz (115.577 kg)  06/04/15 259 lb (117.482 kg)  05/10/15 254 lb 1.6 oz (115.259 kg)      Studies/Labs Reviewed:     EKG:  EKG is  ordered today.  The ekg ordered today demonstrates sinus brady, HR 52, LAD, LVH, NSSTTW changes, QTc 448 ms, no changes.   Recent Labs: 05/09/2015: ALT 36; B Natriuretic Peptide 171.3*; Hemoglobin  14.7; Magnesium 2.1; Platelets 203; TSH 1.871 05/10/2015: BUN 17; Creatinine, Ser 1.21; Potassium 3.7; Sodium 142   Recent Lipid Panel    Component Value Date/Time   CHOL 136 01/15/2015 1053   TRIG 84.0 01/15/2015 1053   HDL 49.60 01/15/2015 1053   CHOLHDL 3 01/15/2015 1053   VLDL 16.8 01/15/2015 1053   LDLCALC 70 01/15/2015 1053    Additional studies/ records that were reviewed today include:    Echo 2/17 Mild LVH, EF 50-55%, no RWMA, normal diastolic function, mild LAE, mod TR  Carotid US 5/15 bilat ICA 1-39%  Myoview 8/13 Overall Impression: Normal stress nuclear study with a small, mild, fixed inferior defect consistent with inferior thinning but no ischemia.  LV Ejection Fraction: 62%. LV Wall Motion: NL LV Function; NL Wall Motion  LHC 1/11 LM dist 40% LAD ostial 40%, mid 60-70%, 40% LCx occluded OM2, LCx stent patent with 40% ISR, mid AV groove 90% RCA prox 30-40%, mid 30% L-LAD patent S-Dx/OM2 patent S-PDA occluded EF 60% PCI 3 x 18 mm Promus DES to mid LCx   ASSESSMENT:     1. PAF - CAHDs VASc=7   2. CAD S/P CFX DES 2011   3. Chronic diastolic CHF (congestive heart failure) ([place])   4. Essential hypertension   5. Hyperlipidemia   6. On amiodarone therapy     PLAN:     In order of problems listed above:  1. PAF - Maintaining sinus rhythm. He remains on amiodarone. He is somewhat dizzy on amiodarone 200 mg daily and Toprol-XL 25 mg daily. Heart rate is in the 50s today. I have asked him to decrease his Toprol-XL to 12.5 mg daily at bedtime. If his dizziness continues after several days, he can discontinue this. Continue Coumadin. INR will be checked today.  2. CAD - He is status post CABG and subsequent DES to the mid native LCx. He denies any symptoms of angina. He is  not on aspirin as he is on Coumadin. Continue statin.  3. Chronic Diastolic CHF - Volume remains stable. Continue current dose of Lasix.  4. HTN - Blood pressure fairly stable. Continue  to monitor for now. Continue benazepril, Toprol-XL. If beta blocker has to be discontinued, we may need to increase the dose of his benazepril.  5. HL - Continue statin. LDL in 10/16 was 70.  6. Amiodarone Rx - We discussed the importance of routine follow-up for his amiodarone. Baseline PFTs with DLCO will be arranged. I will see him back in 3 weeks for follow-up on his heart rate and dizziness. Check TSH and LFTs at that time. He understands the importance of annual exams.   Medication Adjustments/Labs and Tests Ordered: Current medicines are reviewed at length with the patient today.  Concerns regarding medicines are outlined above.  Medication changes, Labs and Tests ordered today are outlined in the Patient Instructions noted below. Patient Instructions  Medication Instructions:  1. DECREASE TOPROL XL TO 12.5 MG AT BEDTIME; IF YOUR DIZZINESS IS NOT BETTER AFTER 3-7 DAYS THEN STOP THE TOPROL AND CALL THE OFFICE TO ADVISE [id number]  Labwork: NONE  Testing/Procedures: 1. Your physician has recommended that you have a pulmonary function test. Pulmonary Function Tests are a group of tests that measure how well air moves in and out of your lungs.  Follow-Up: Hardin Hardenbrook, PAC 3 WEEKS   Any Other Special Instructions Will Be Listed Below (If Applicable).  If you need a refill on your cardiac medications before your next appointment, please call your pharmacy.   Signed, [name], PA-C  06/05/2015 5:38 PM    [facility] [place], [place], [place]  [address] Phone: [phone]; Fax: [phone]

## 2015-06-05 ENCOUNTER — Ambulatory Visit (INDEPENDENT_AMBULATORY_CARE_PROVIDER_SITE_OTHER): Payer: Medicare Other | Admitting: *Deleted

## 2015-06-05 ENCOUNTER — Encounter: Payer: Self-pay | Admitting: Physician Assistant

## 2015-06-05 ENCOUNTER — Ambulatory Visit (INDEPENDENT_AMBULATORY_CARE_PROVIDER_SITE_OTHER): Payer: Medicare Other | Admitting: Physician Assistant

## 2015-06-05 VITALS — BP 144/78 | HR 56 | Ht 75.0 in | Wt 254.8 lb

## 2015-06-05 DIAGNOSIS — Z7901 Long term (current) use of anticoagulants: Secondary | ICD-10-CM

## 2015-06-05 DIAGNOSIS — I2510 Atherosclerotic heart disease of native coronary artery without angina pectoris: Secondary | ICD-10-CM | POA: Diagnosis not present

## 2015-06-05 DIAGNOSIS — I480 Paroxysmal atrial fibrillation: Secondary | ICD-10-CM | POA: Diagnosis not present

## 2015-06-05 DIAGNOSIS — I10 Essential (primary) hypertension: Secondary | ICD-10-CM | POA: Diagnosis not present

## 2015-06-05 DIAGNOSIS — Z79899 Other long term (current) drug therapy: Secondary | ICD-10-CM | POA: Diagnosis not present

## 2015-06-05 DIAGNOSIS — Z9861 Coronary angioplasty status: Secondary | ICD-10-CM | POA: Diagnosis not present

## 2015-06-05 DIAGNOSIS — I4891 Unspecified atrial fibrillation: Secondary | ICD-10-CM | POA: Diagnosis not present

## 2015-06-05 DIAGNOSIS — I5032 Chronic diastolic (congestive) heart failure: Secondary | ICD-10-CM

## 2015-06-05 DIAGNOSIS — E785 Hyperlipidemia, unspecified: Secondary | ICD-10-CM

## 2015-06-05 LAB — POCT INR: INR: 3.2

## 2015-06-05 MED ORDER — METOPROLOL SUCCINATE ER 25 MG PO TB24: 12.5000 mg | ORAL_TABLET | Freq: Every day | ORAL | Status: DC

## 2015-06-05 NOTE — Telephone Encounter (Signed)
Completed form mailed to patient as requested; copy made for scan/SLS 03/21

## 2015-06-05 NOTE — Patient Instructions (Addendum)
Medication Instructions:  1. DECREASE TOPROL XL TO 12.5 MG AT BEDTIME; IF YOUR DIZZINESS IS NOT BETTER AFTER 3-7 DAYS THEN STOP THE TOPROL AND CALL THE OFFICE TO ADVISE [id number]  Labwork: NONE  Testing/Procedures: 1. Your physician has recommended that you have a pulmonary function test. Pulmonary Function Tests are a group of tests that measure how well air moves in and out of your lungs.  Follow-Up: SCOTT WEAVER, PAC 3 WEEKS   Any Other Special Instructions Will Be Listed Below (If Applicable).  If you need a refill on your cardiac medications before your next appointment, please call your pharmacy.

## 2015-06-14 ENCOUNTER — Encounter (HOSPITAL_COMMUNITY): Payer: Medicare Other

## 2015-06-18 ENCOUNTER — Other Ambulatory Visit: Payer: Self-pay | Admitting: Family

## 2015-06-21 NOTE — Progress Notes (Signed)
Cardiology Office Note:    Date:  06/22/2015   ID:  [name], DOB 10-03-1945, MRN [id number]  PCP:  [name]., NP  Cardiologist:  Dr. [name]   Electrophysiologist:  n/a  Chief Complaint  Patient presents with  . Atrial Fibrillation    follow up    History of Present Illness:     [name] is a 70 y.o. male with a hx of CAD s/p CABG, PAF, prior CVA.  Myoview in 2013 was neg for ischemia.  He has residual gait difficulty 2/2 to prior CVA.  Last seen by Dr. [name] in 3/16.    Admitted in 2/17 from AFib Clinic with AF with RVR. He had undergone DCCV earlier in 2/17 for AF with RVR.  HR was in 150s.  He was placed on Amiodarone gtt.  He underwent DCCV with return of NSR and was DC home on Amiodarone.  I saw him 06/05/15.  He complained of dizziness and he was bradycardic.  I reduced his beta-blocker dose.  Returns for FU.  He ended up stopping the Toprol-XL.  He feels much better.  No further dizziness.  The patient denies chest pain, shortness of breath, syncope, orthopnea, PND or significant pedal edema.    Past Medical History  Diagnosis Date  . GERD (gastroesophageal reflux disease)   . Gout   . Hyperlipidemia   . Hypertension   . CVA (cerebral infarction)     left hemiparesis  . Chronic pain     left sided-Kristeins  . CAD (coronary artery disease)     s/p CABG, s/p DES to LCX  January 2011  . OSA on CPAP   . Sleep apnea   . History of colonoscopy   . Stroke ([place])   . Dysrhythmia     hx of atrial fibrilation with cardioversion  . Hypothyroidism   . Diastolic CHF ([place])   1. GERD 2. Gout 3. Hypothyroidism 4. HTN 5. H/o CVA: some residual left-sided weakness.  6. Borderline DM2 7. CAD s/p CABG in 2004. LHC in 2011: SVG-RCA and SVG-OM totally occluded. LIMA-LAD patent. Patient had DES to CFX, flow down native RCA was ok. Lexiscan myoview (8/13): EF 62%, small mild fixed inferior defect with no ischemia. Echo (9/14) with EF  55-60%, grade II diastolic dysfunction, PA systolic pressure 35 mmHg.  8. OSA: Intolerant of CPAP, wears oxygen at night.  9. Paroxysmal atrial fibrillation. TEE (4/13) with EF 55-60%, no significant valvular abnormalities.  10. Carotid dopplers (5/15) with minimal stenosis  Past Surgical History  Procedure Laterality Date  . Coronary artery bypass graft      stent  . Esophagogastroduodenoscopy  03-25-2005  . Nasal septum surgery    . Percutaneous placement intravascular stent cervical carotid artery      03-2009; using a drug-eluting platform of the circumflex cornoray artery with a 3.0 x 18 [employer] Promus drug-eluting platform post dilated to 3.75 with a noncompliant balloon.  . Cataract extraction  05/2010    left eye  . Cataract extraction  04/2010    right eye  . Cardioversion  06/20/2011    Procedure: CARDIOVERSION;  Surgeon: Larey Dresser, MD;  Location: [name] ENDOSCOPY;  Service: Cardiovascular;  Laterality: N/A;  . Tee without cardioversion  06/20/2011    Procedure: TRANSESOPHAGEAL ECHOCARDIOGRAM (TEE);  Surgeon: Larey Dresser, MD;  Location: [facility];  Service: Cardiovascular;  Laterality: N/A;  . Cardioversion N/A 04/26/2015    Procedure: CARDIOVERSION;  Surgeon: Houston Siren  Hazel Sams, MD;  Location: [facility] ENDOSCOPY;  Service: Cardiovascular;  Laterality: N/A;  . Cardioversion N/A 05/10/2015    Procedure: CARDIOVERSION;  Surgeon: Larey Dresser, MD;  Location: [facility] ENDOSCOPY;  Service: Cardiovascular;  Laterality: N/A;    Current Medications: Outpatient Prescriptions Prior to Visit  Medication Sig Dispense Refill  . allopurinol (ZYLOPRIM) 100 MG tablet TAKE 2 TABLETS BY MOUTH DAILY. D/C PREVIOUS SCRIPTS FOR THIS MEDICATION 180 tablet 1  . amiodarone (PACERONE) 200 MG tablet Take 200 mg by mouth daily.    Marland Kitchen atorvastatin (LIPITOR) 40 MG tablet Take two tablets ( 80 mg total ) by mouth daily    . docusate sodium (COLACE) 100 MG capsule Take 1 capsule (100 mg total) by mouth 2  (two) times daily. 10 capsule 0  . fluticasone (FLONASE) 50 MCG/ACT nasal spray Place 2 sprays into both nostrils daily. 9.9 g 1  . furosemide (LASIX) 40 MG tablet TAKE 1 TABLET BY MOUTH EVERY OTHER DAY 45 tablet 1  . levothyroxine (SYNTHROID, LEVOTHROID) 112 MCG tablet TAKE 1 TABLET (112 MCG TOTAL) BY MOUTH DAILY BEFORE BREAKFAST. 90 tablet 1  . metFORMIN (GLUCOPHAGE) 500 MG tablet TAKE 1 TABLET EVERY DAY WITH BREAKFAST 90 tablet 1  . Multiple Vitamins-Minerals (MULTIVITAMIN GUMMIES MENS PO) Take 1 tablet by mouth daily.    . nitroGLYCERIN (NITROSTAT) 0.4 MG SL tablet Place 1 tablet (0.4 mg total) under the tongue every 5 (five) minutes as needed for chest pain. Up to 3 doses 10 tablet 2  . pantoprazole (PROTONIX) 40 MG tablet Take 1 tablet (40 mg total) by mouth daily. 90 tablet 1  . traMADol (ULTRAM) 50 MG tablet Take by mouth every 6 (six) hours as needed (for pain).    . traMADol (ULTRAM-ER) 200 MG 24 hr tablet TAKE 1 TABLET EVERY DAY WITH AM DOSE OF TRAMADOL 50 MG 90 tablet 1  . vitamin B-12 1000 MCG tablet Take 1 tablet (1,000 mcg total) by mouth daily. 30 tablet 0  . warfarin (COUMADIN) 2.5 MG tablet TAKE AS DIRECTED BY ANTICOAGULATION CLINIC (Patient taking differently: Take 2.5 mg by mouth daily in the morning.) 100 tablet 0  . benazepril (LOTENSIN) 10 MG tablet TAKE 1 TABLET BY MOUTH DAILY D/C PREVIOUS SCRIPTS FOR THIS MEDICATION 90 tablet 1  . metoprolol succinate (TOPROL-XL) 25 MG 24 hr tablet Take 0.5 tablets (12.5 mg total) by mouth at bedtime. (Patient not taking: Reported on 06/22/2015) 30 tablet 11   No facility-administered medications prior to visit.     Allergies:   Review of patient's allergies indicates no known allergies.   Social History   Social History  . Marital Status: Married    Spouse Name: [name]  . Number of Children: N/A  . Years of Education: N/A   Occupational History  . retired Hydrographic surveyor   Social History Main Topics  . Smoking  status: Former Research scientist (life sciences)  . Smokeless tobacco: Never Used  . Alcohol Use: No     Comment: 1 beer a month  . Drug Use: No  . Sexual Activity: No   Other Topics Concern  . None   Social History Narrative   Pt lives with wife. Does have stairs, but patient doesn't use them. Pt has completed technical school     Family History:  The patient's family history includes Lung cancer in his father; Stroke in his mother.   ROS:   Please see the history of present illness.    ROS All  other systems reviewed and are negative.   Physical Exam:    VS:  BP 142/80 mmHg  Pulse 64  Ht 6\' 3"  (1.905 m)  Wt 256 lb 12.8 oz (116.484 kg)  BMI 32.10 kg/m2   GEN: Well nourished, well developed, in no acute distress HEENT: normal Neck: no JVD, no masses Cardiac: Normal S1/S2, RRR; no murmurs, rubs, or gallops, no edema    Respiratory:  clear to auscultation bilaterally; no wheezing, rhonchi or rales GI: soft, nontender, nondistended MS: no deformity or atrophy Skin: warm and dry Neuro: No focal deficits  Psych: Alert and oriented x 3, normal affect  Wt Readings from Last 3 Encounters:  06/22/15 256 lb 12.8 oz (116.484 kg)  06/05/15 254 lb 12.8 oz (115.577 kg)  06/04/15 259 lb (117.482 kg)      Studies/Labs Reviewed:     EKG:  EKG is  ordered today.  The ekg ordered today demonstrates NSR, HR 64, LAD, QTc 476 ms no changes   Recent Labs: 05/09/2015: ALT 36; B Natriuretic Peptide 171.3*; Hemoglobin 14.7; Magnesium 2.1; Platelets 203; TSH 1.871 05/10/2015: BUN 17; Creatinine, Ser 1.21; Potassium 3.7; Sodium 142   Recent Lipid Panel    Component Value Date/Time   CHOL 136 01/15/2015 1053   TRIG 84.0 01/15/2015 1053   HDL 49.60 01/15/2015 1053   CHOLHDL 3 01/15/2015 1053   VLDL 16.8 01/15/2015 1053   LDLCALC 70 01/15/2015 1053    Additional studies/ records that were reviewed today include:   Echo 2/17 Mild LVH, EF 50-55%, no RWMA, normal diastolic function, mild LAE, mod TR  Carotid  US 5/15 bilat ICA 1-39%  Myoview 8/13 Overall Impression: Normal stress nuclear study with a small, mild, fixed inferior defect consistent with inferior thinning but no ischemia.  LV Ejection Fraction: 62%. LV Wall Motion: NL LV Function; NL Wall Motion  LHC 1/11 LM dist 40% LAD ostial 40%, mid 60-70%, 40% LCx occluded OM2, LCx stent patent with 40% ISR, mid AV groove 90% RCA prox 30-40%, mid 30% L-LAD patent S-Dx/OM2 patent S-PDA occluded EF 60% PCI 3 x 18 mm Promus DES to mid LCx   ASSESSMENT:     1. PAF (paroxysmal atrial fibrillation) ([place])   2. Coronary artery disease involving native coronary artery of native heart without angina pectoris   3. Chronic diastolic CHF (congestive heart failure) ([place])   4. Essential hypertension   5. Hyperlipidemia   6. On amiodarone therapy     PLAN:     In order of problems listed above:  1. PAF - Maintaining sinus rhythm. He remains on amiodarone. Bradycardia resolved off of beta blocker.  Continue Coumadin. INR will be checked today.  2. CAD - He is status post CABG and subsequent DES to the mid native LCx. He denies any symptoms of angina. He is not on aspirin as he is on Coumadin. Continue statin.  3. Chronic Diastolic CHF - Volume remains stable. Continue current dose of Lasix.  4. HTN - BP borderline elevated. Increase Benazepril to 10 mg bid.  BMET 2 weeks.   5. HL - Continue statin. LDL in 10/16 was 70.  6. Amiodarone Rx -   Baseline PFTs with DLCO will be arranged.  Check TSH and LFTs 2 weeks.    Medication Adjustments/Labs and Tests Ordered: Current medicines are reviewed at length with the patient today.  Concerns regarding medicines are outlined above.  Medication changes, Labs and Tests ordered today are outlined in the Patient Instructions noted  below. Patient Instructions  Medication Instructions:  1. INCREASE BENAZEPRIL TO 10 MG TWICE DAILY Labwork: 1. IN 2 WEEKS FOR BMET, TSH, LFT 7:30-5  PM Testing/Procedures: NONE Follow-Up: DR. Aundra Dubin 10/03/15 @ 2:15 Any Other Special Instructions Will Be Listed Below (If Applicable). If you need a refill on your cardiac medications before your next appointment, please call your pharmacy.    Signed, [name], PA-C  06/22/2015 1:44 PM    [facility] [place], [place], [place]  [address] Phone: [phone]; Fax: [phone]

## 2015-06-22 ENCOUNTER — Encounter: Payer: Self-pay | Admitting: Physician Assistant

## 2015-06-22 ENCOUNTER — Ambulatory Visit (INDEPENDENT_AMBULATORY_CARE_PROVIDER_SITE_OTHER): Payer: Medicare Other | Admitting: Physician Assistant

## 2015-06-22 ENCOUNTER — Ambulatory Visit (INDEPENDENT_AMBULATORY_CARE_PROVIDER_SITE_OTHER): Payer: Medicare Other | Admitting: *Deleted

## 2015-06-22 VITALS — BP 142/80 | HR 64 | Ht 75.0 in | Wt 256.8 lb

## 2015-06-22 DIAGNOSIS — I10 Essential (primary) hypertension: Secondary | ICD-10-CM

## 2015-06-22 DIAGNOSIS — Z7901 Long term (current) use of anticoagulants: Secondary | ICD-10-CM | POA: Diagnosis not present

## 2015-06-22 DIAGNOSIS — I2510 Atherosclerotic heart disease of native coronary artery without angina pectoris: Secondary | ICD-10-CM

## 2015-06-22 DIAGNOSIS — I5032 Chronic diastolic (congestive) heart failure: Secondary | ICD-10-CM | POA: Diagnosis not present

## 2015-06-22 DIAGNOSIS — I4891 Unspecified atrial fibrillation: Secondary | ICD-10-CM

## 2015-06-22 DIAGNOSIS — I480 Paroxysmal atrial fibrillation: Secondary | ICD-10-CM | POA: Diagnosis not present

## 2015-06-22 DIAGNOSIS — Z9861 Coronary angioplasty status: Secondary | ICD-10-CM

## 2015-06-22 DIAGNOSIS — Z79899 Other long term (current) drug therapy: Secondary | ICD-10-CM

## 2015-06-22 DIAGNOSIS — E785 Hyperlipidemia, unspecified: Secondary | ICD-10-CM

## 2015-06-22 LAB — POCT INR: INR: 4

## 2015-06-22 MED ORDER — BENAZEPRIL HCL 10 MG PO TABS: 10.0000 mg | ORAL_TABLET | Freq: Two times a day (BID) | ORAL | Status: DC

## 2015-06-22 NOTE — Patient Instructions (Addendum)
Medication Instructions:  1. INCREASE BENAZEPRIL TO 10 MG TWICE DAILY Labwork: 1. IN 2 WEEKS FOR BMET, TSH, LFT 7:30-5 PM Testing/Procedures: NONE Follow-Up: DR. Aundra Dubin 10/03/15 @ 2:15 Any Other Special Instructions Will Be Listed Below (If Applicable). If you need a refill on your cardiac medications before your next appointment, please call your pharmacy.

## 2015-07-03 ENCOUNTER — Encounter: Payer: Self-pay | Admitting: Physical Medicine & Rehabilitation

## 2015-07-03 ENCOUNTER — Encounter: Payer: Medicare Other | Attending: Physical Medicine & Rehabilitation

## 2015-07-03 ENCOUNTER — Ambulatory Visit (HOSPITAL_BASED_OUTPATIENT_CLINIC_OR_DEPARTMENT_OTHER): Payer: Medicare Other | Admitting: Physical Medicine & Rehabilitation

## 2015-07-03 VITALS — BP 142/82 | HR 66 | Resp 14

## 2015-07-03 DIAGNOSIS — G8110 Spastic hemiplegia affecting unspecified side: Secondary | ICD-10-CM

## 2015-07-03 DIAGNOSIS — I69354 Hemiplegia and hemiparesis following cerebral infarction affecting left non-dominant side: Secondary | ICD-10-CM | POA: Diagnosis not present

## 2015-07-03 DIAGNOSIS — G8114 Spastic hemiplegia affecting left nondominant side: Secondary | ICD-10-CM

## 2015-07-03 MED ORDER — TRAMADOL HCL 50 MG PO TABS: 50.0000 mg | ORAL_TABLET | Freq: Two times a day (BID) | ORAL | Status: DC

## 2015-07-03 MED ORDER — TRAMADOL HCL ER 200 MG PO TB24: ORAL_TABLET | ORAL | Status: DC

## 2015-07-03 NOTE — Patient Instructions (Signed)

## 2015-07-03 NOTE — Progress Notes (Signed)
Botox Injection for spasticity using needle EMG guidance and E stim  Dilution: 50 Units/ml  Indication: Severe spasticity which interferes with ADL,mobility and/or hygiene and is unresponsive to medication management and other conservative care  Informed consent was obtained after describing risks and benefits of the procedure with the patient. This includes bleeding, bruising, infection, excessive weakness, or medication side effects. A REMS form is on file and signed.  Needle: 27g 1" needle electrode  Number of units per muscle   Biceps75 FCR50  FCU0  FDS25 FDP50 Gastrosoleus0  Hamstrings 200  All injections were done after obtaining appropriate EMG activity and E stim and after negative drawback for blood. The patient tolerated the procedure well. Post procedure instructions were given. A followup appointment was made.

## 2015-07-05 ENCOUNTER — Ambulatory Visit (INDEPENDENT_AMBULATORY_CARE_PROVIDER_SITE_OTHER): Payer: Medicare Other | Admitting: *Deleted

## 2015-07-05 DIAGNOSIS — Z7901 Long term (current) use of anticoagulants: Secondary | ICD-10-CM | POA: Diagnosis not present

## 2015-07-05 DIAGNOSIS — I4891 Unspecified atrial fibrillation: Secondary | ICD-10-CM | POA: Diagnosis not present

## 2015-07-05 LAB — POCT INR: INR: 5.3

## 2015-07-06 ENCOUNTER — Other Ambulatory Visit (INDEPENDENT_AMBULATORY_CARE_PROVIDER_SITE_OTHER): Payer: Medicare Other | Admitting: *Deleted

## 2015-07-06 DIAGNOSIS — Z79899 Other long term (current) drug therapy: Secondary | ICD-10-CM

## 2015-07-06 DIAGNOSIS — E785 Hyperlipidemia, unspecified: Secondary | ICD-10-CM

## 2015-07-06 DIAGNOSIS — I4891 Unspecified atrial fibrillation: Secondary | ICD-10-CM | POA: Diagnosis not present

## 2015-07-06 DIAGNOSIS — I480 Paroxysmal atrial fibrillation: Secondary | ICD-10-CM

## 2015-07-06 LAB — BASIC METABOLIC PANEL
BUN: 14 mg/dL (ref 7–25)
CO2: 29 mmol/L (ref 20–31)
Calcium: 8.9 mg/dL (ref 8.6–10.3)
Chloride: 96 mmol/L — ABNORMAL LOW (ref 98–110)
Creat: 1.08 mg/dL (ref 0.70–1.18)
GLUCOSE: 110 mg/dL — AB (ref 65–99)
POTASSIUM: 3.8 mmol/L (ref 3.5–5.3)
SODIUM: 134 mmol/L — AB (ref 135–146)

## 2015-07-06 LAB — HEPATIC FUNCTION PANEL
ALK PHOS: 56 U/L (ref 40–115)
ALT: 46 U/L (ref 9–46)
AST: 36 U/L — ABNORMAL HIGH (ref 10–35)
Albumin: 4 g/dL (ref 3.6–5.1)
BILIRUBIN DIRECT: 0.1 mg/dL (ref <=0.2)
BILIRUBIN INDIRECT: 0.6 mg/dL (ref 0.2–1.2)
TOTAL PROTEIN: 6.8 g/dL (ref 6.1–8.1)
Total Bilirubin: 0.7 mg/dL (ref 0.2–1.2)

## 2015-07-06 NOTE — Addendum Note (Signed)
Addended by: Eulis Foster on: 07/06/2015 09:12 AM   Modules accepted: Orders

## 2015-07-07 LAB — TSH: TSH: 2.23 m[IU]/L (ref 0.40–4.50)

## 2015-07-09 ENCOUNTER — Telehealth: Payer: Self-pay | Admitting: *Deleted

## 2015-07-09 NOTE — Telephone Encounter (Signed)
Pt has been notified of lab results by phone with verbal understanding. 

## 2015-07-10 ENCOUNTER — Telehealth: Payer: Self-pay | Admitting: *Deleted

## 2015-07-10 MED ORDER — ALLOPURINOL 100 MG PO TABS: ORAL_TABLET | ORAL | Status: DC

## 2015-07-10 NOTE — Telephone Encounter (Signed)
Received fax from [facility] requesting refills of allopurinol. Refills sent.

## 2015-07-13 ENCOUNTER — Ambulatory Visit (INDEPENDENT_AMBULATORY_CARE_PROVIDER_SITE_OTHER): Payer: Medicare Other | Admitting: *Deleted

## 2015-07-13 DIAGNOSIS — Z7901 Long term (current) use of anticoagulants: Secondary | ICD-10-CM

## 2015-07-13 DIAGNOSIS — I4891 Unspecified atrial fibrillation: Secondary | ICD-10-CM | POA: Diagnosis not present

## 2015-07-13 LAB — POCT INR: INR: 2.4

## 2015-08-03 ENCOUNTER — Ambulatory Visit (INDEPENDENT_AMBULATORY_CARE_PROVIDER_SITE_OTHER): Payer: Medicare Other | Admitting: *Deleted

## 2015-08-03 DIAGNOSIS — I4891 Unspecified atrial fibrillation: Secondary | ICD-10-CM

## 2015-08-03 DIAGNOSIS — Z7901 Long term (current) use of anticoagulants: Secondary | ICD-10-CM

## 2015-08-03 LAB — POCT INR: INR: 2.6

## 2015-08-08 ENCOUNTER — Encounter: Payer: Self-pay | Admitting: Cardiology

## 2015-08-22 DIAGNOSIS — B351 Tinea unguium: Secondary | ICD-10-CM | POA: Diagnosis not present

## 2015-08-22 DIAGNOSIS — L603 Nail dystrophy: Secondary | ICD-10-CM | POA: Diagnosis not present

## 2015-08-22 DIAGNOSIS — I739 Peripheral vascular disease, unspecified: Secondary | ICD-10-CM | POA: Diagnosis not present

## 2015-08-22 DIAGNOSIS — M79609 Pain in unspecified limb: Secondary | ICD-10-CM | POA: Diagnosis not present

## 2015-08-31 ENCOUNTER — Emergency Department (HOSPITAL_COMMUNITY)
Admission: EM | Admit: 2015-08-31 | Discharge: 2015-08-31 | Disposition: A | Discharge status: Home / Self Care | Payer: Medicare Other | Attending: Emergency Medicine | Admitting: Emergency Medicine

## 2015-08-31 ENCOUNTER — Emergency Department (HOSPITAL_COMMUNITY): Payer: Medicare Other

## 2015-08-31 ENCOUNTER — Encounter (HOSPITAL_COMMUNITY): Payer: Self-pay | Admitting: Emergency Medicine

## 2015-08-31 DIAGNOSIS — K5900 Constipation, unspecified: Secondary | ICD-10-CM | POA: Diagnosis not present

## 2015-08-31 DIAGNOSIS — I2510 Atherosclerotic heart disease of native coronary artery without angina pectoris: Secondary | ICD-10-CM | POA: Diagnosis not present

## 2015-08-31 DIAGNOSIS — Z87891 Personal history of nicotine dependence: Secondary | ICD-10-CM | POA: Diagnosis not present

## 2015-08-31 DIAGNOSIS — Z79899 Other long term (current) drug therapy: Secondary | ICD-10-CM | POA: Diagnosis not present

## 2015-08-31 DIAGNOSIS — Z7901 Long term (current) use of anticoagulants: Secondary | ICD-10-CM | POA: Diagnosis not present

## 2015-08-31 DIAGNOSIS — I480 Paroxysmal atrial fibrillation: Secondary | ICD-10-CM | POA: Diagnosis not present

## 2015-08-31 DIAGNOSIS — Z951 Presence of aortocoronary bypass graft: Secondary | ICD-10-CM | POA: Diagnosis not present

## 2015-08-31 DIAGNOSIS — E039 Hypothyroidism, unspecified: Secondary | ICD-10-CM | POA: Insufficient documentation

## 2015-08-31 DIAGNOSIS — E785 Hyperlipidemia, unspecified: Secondary | ICD-10-CM | POA: Insufficient documentation

## 2015-08-31 DIAGNOSIS — I5030 Unspecified diastolic (congestive) heart failure: Secondary | ICD-10-CM | POA: Insufficient documentation

## 2015-08-31 DIAGNOSIS — Z8673 Personal history of transient ischemic attack (TIA), and cerebral infarction without residual deficits: Secondary | ICD-10-CM | POA: Insufficient documentation

## 2015-08-31 DIAGNOSIS — I110 Hypertensive heart disease with heart failure: Secondary | ICD-10-CM | POA: Insufficient documentation

## 2015-08-31 DIAGNOSIS — Z7984 Long term (current) use of oral hypoglycemic drugs: Secondary | ICD-10-CM | POA: Insufficient documentation

## 2015-08-31 LAB — CBC WITH DIFFERENTIAL/PLATELET
BASOS ABS: 0.1 10*3/uL (ref 0.0–0.1)
BASOS PCT: 2 %
EOS ABS: 0.4 10*3/uL (ref 0.0–0.7)
Eosinophils Relative: 7 %
HCT: 40.8 % (ref 39.0–52.0)
HEMOGLOBIN: 12.9 g/dL — AB (ref 13.0–17.0)
Lymphocytes Relative: 28 %
Lymphs Abs: 1.6 10*3/uL (ref 0.7–4.0)
MCH: 28.9 pg (ref 26.0–34.0)
MCHC: 31.6 g/dL (ref 30.0–36.0)
MCV: 91.3 fL (ref 78.0–100.0)
MONO ABS: 0.7 10*3/uL (ref 0.1–1.0)
MONOS PCT: 12 %
NEUTROS ABS: 3 10*3/uL (ref 1.7–7.7)
NEUTROS PCT: 53 %
Platelets: 148 10*3/uL — ABNORMAL LOW (ref 150–400)
RBC: 4.47 MIL/uL (ref 4.22–5.81)
RDW: 13.9 % (ref 11.5–15.5)
WBC: 5.7 10*3/uL (ref 4.0–10.5)

## 2015-08-31 LAB — COMPREHENSIVE METABOLIC PANEL
ALBUMIN: 3.6 g/dL (ref 3.5–5.0)
ALT: 53 U/L (ref 17–63)
ANION GAP: 6 (ref 5–15)
AST: 39 U/L (ref 15–41)
Alkaline Phosphatase: 52 U/L (ref 38–126)
BUN: 16 mg/dL (ref 6–20)
CO2: 30 mmol/L (ref 22–32)
Calcium: 9 mg/dL (ref 8.9–10.3)
Chloride: 106 mmol/L (ref 101–111)
Creatinine, Ser: 1.26 mg/dL — ABNORMAL HIGH (ref 0.61–1.24)
GFR calc Af Amer: >60 mL/min (ref >60)
GFR calc non Af Amer: 55 mL/min — ABNORMAL LOW (ref >60)
GLUCOSE: 129 mg/dL — AB (ref 65–99)
POTASSIUM: 4.6 mmol/L (ref 3.5–5.1)
SODIUM: 142 mmol/L (ref 135–145)
Total Bilirubin: 0.7 mg/dL (ref 0.3–1.2)
Total Protein: 6.5 g/dL (ref 6.5–8.1)

## 2015-08-31 MED ORDER — FLEET ENEMA 7-19 GM/118ML RE ENEM
1.0000 | ENEMA | Freq: Once | RECTAL | Status: AC
  Administered 2015-08-31: 1 via RECTAL
  Filled 2015-08-31: qty 1

## 2015-08-31 MED ORDER — POLYETHYLENE GLYCOL 3350 17 GM/SCOOP PO POWD: 17.0000 g | Freq: Every day | ORAL | Status: DC

## 2015-08-31 MED ORDER — MILK AND MOLASSES ENEMA
1.0000 | Freq: Once | RECTAL | Status: AC
  Administered 2015-08-31: 150 mL via RECTAL
  Filled 2015-08-31: qty 250

## 2015-08-31 MED ORDER — SENNA 8.6 MG PO TABS: 1.0000 | ORAL_TABLET | Freq: Every day | ORAL | Status: DC

## 2015-08-31 NOTE — Discharge Instructions (Signed)
Take two additional laxatives with your colace as prescribed. Return without fail for worsening symptoms, including fever, abdominal pain, vomiting and unable to keep down food/fluids, or any other symptoms concerning to you.  Constipation, Adult Constipation is when a person has fewer than three bowel movements a week, has difficulty having a bowel movement, or has stools that are dry, hard, or larger than normal. As people grow older, constipation is more common. A low-fiber diet, not taking in enough fluids, and taking certain medicines may make constipation worse.  CAUSES   Certain medicines, such as antidepressants, pain medicine, iron supplements, antacids, and water pills.   Certain diseases, such as diabetes, irritable bowel syndrome (IBS), thyroid disease, or depression.   Not drinking enough water.   Not eating enough fiber-rich foods.   Stress or travel.   Lack of physical activity or exercise.   Ignoring the urge to have a bowel movement.   Using laxatives too much.  SIGNS AND SYMPTOMS   Having fewer than three bowel movements a week.   Straining to have a bowel movement.   Having stools that are hard, dry, or larger than normal.   Feeling full or bloated.   Pain in the lower abdomen.   Not feeling relief after having a bowel movement.  DIAGNOSIS  Your health care provider will take a medical history and perform a physical exam. Further testing may be done for severe constipation. Some tests may include:  A barium enema X-ray to examine your rectum, colon, and, sometimes, your small intestine.   A sigmoidoscopy to examine your lower colon.   A colonoscopy to examine your entire colon. TREATMENT  Treatment will depend on the severity of your constipation and what is causing it. Some dietary treatments include drinking more fluids and eating more fiber-rich foods. Lifestyle treatments may include regular exercise. If these diet and lifestyle  recommendations do not help, your health care provider may recommend taking over-the-counter laxative medicines to help you have bowel movements. Prescription medicines may be prescribed if over-the-counter medicines do not work.  HOME CARE INSTRUCTIONS   Eat foods that have a lot of fiber, such as fruits, vegetables, whole grains, and beans.  Limit foods high in fat and processed sugars, such as french fries, hamburgers, cookies, candies, and soda.   A fiber supplement may be added to your diet if you cannot get enough fiber from foods.   Drink enough fluids to keep your urine clear or pale yellow.   Exercise regularly or as directed by your health care provider.   Go to the restroom when you have the urge to go. Do not hold it.   Only take over-the-counter or prescription medicines as directed by your health care provider. Do not take other medicines for constipation without talking to your health care provider first.  [place] IF:   You have bright red blood in your stool.   Your constipation lasts for more than 4 days or gets worse.   You have abdominal or rectal pain.   You have thin, pencil-like stools.   You have unexplained weight loss. MAKE SURE YOU:   Understand these instructions.  Will watch your condition.  Will get help right away if you are not doing well or get worse.   This information is not intended to replace advice given to you by your health care provider. Make sure you discuss any questions you have with your health care provider.   Document Released: 11/30/2003  Document Revised: 03/24/2014 Document Reviewed: 12/13/2012 Elsevier Interactive Patient Education 2016 Elsevier Inc.  High-Fiber Diet Fiber, also called dietary fiber, is a type of carbohydrate found in fruits, vegetables, whole grains, and beans. A high-fiber diet can have many health benefits. Your health care provider may recommend a high-fiber diet to  help:  Prevent constipation. Fiber can make your bowel movements more regular.  Lower your cholesterol.  Relieve hemorrhoids, uncomplicated diverticulosis, or irritable bowel syndrome.  Prevent overeating as part of a weight-loss plan.  Prevent heart disease, type 2 diabetes, and certain cancers. WHAT IS MY PLAN? The recommended daily intake of fiber includes:  38 grams for men under age 27.  16 grams for men over age 87.  39 grams for women under age 1.  55 grams for women over age 40. You can get the recommended daily intake of dietary fiber by eating a variety of fruits, vegetables, grains, and beans. Your health care provider may also recommend a fiber supplement if it is not possible to get enough fiber through your diet. WHAT DO I NEED TO KNOW ABOUT A HIGH-FIBER DIET?  Fiber supplements have not been widely studied for their effectiveness, so it is better to get fiber through food sources.  Always check the fiber content on thenutrition facts label of any prepackaged food. Look for foods that contain at least 5 grams of fiber per serving.  Ask your dietitian if you have questions about specific foods that are related to your condition, especially if those foods are not listed in the following section.  Increase your daily fiber consumption gradually. Increasing your intake of dietary fiber too quickly may cause bloating, cramping, or gas.  Drink plenty of water. Water helps you to digest fiber. WHAT FOODS CAN I EAT? Grains Whole-grain breads. Multigrain cereal. Oats and oatmeal. Brown rice. Barley. Bulgur wheat. [place]. Bran muffins. Popcorn. Rye wafer crackers. Vegetables Sweet potatoes. Spinach. Kale. Artichokes. Cabbage. Broccoli. Green peas. Carrots. Squash. Fruits Berries. Pears. Apples. Oranges. Avocados. Prunes and raisins. Dried figs. Meats and Other Protein Sources [place], kidney, pinto, and soy beans. Split peas. Lentils. Nuts and  seeds. Dairy Fiber-fortified yogurt. Beverages Fiber-fortified soy milk. Fiber-fortified orange juice. Other Fiber bars. The items listed above may not be a complete list of recommended foods or beverages. Contact your dietitian for more options. WHAT FOODS ARE NOT RECOMMENDED? Grains White bread. Pasta made with refined flour. White rice. Vegetables Fried potatoes. Canned vegetables. Well-cooked vegetables.  Fruits Fruit juice. Cooked, strained fruit. Meats and Other Protein Sources Fatty cuts of meat. Fried Sales executive or fried fish. Dairy Milk. Yogurt. Cream cheese. Sour cream. Beverages Soft drinks. Other Cakes and pastries. Butter and oils. The items listed above may not be a complete list of foods and beverages to avoid. Contact your dietitian for more information. WHAT ARE SOME TIPS FOR INCLUDING HIGH-FIBER FOODS IN MY DIET?  Eat a wide variety of high-fiber foods.  Make sure that half of all grains consumed each day are whole grains.  Replace breads and cereals made from refined flour or white flour with whole-grain breads and cereals.  Replace white rice with brown rice, bulgur wheat, or millet.  Start the day with a breakfast that is high in fiber, such as a cereal that contains at least 5 grams of fiber per serving.  Use beans in place of meat in soups, salads, or pasta.  Eat high-fiber snacks, such as berries, raw vegetables, nuts, or popcorn.   This information is not intended  to replace advice given to you by your health care provider. Make sure you discuss any questions you have with your health care provider.   Document Released: 03/03/2005 Document Revised: 03/24/2014 Document Reviewed: 08/16/2013 Elsevier Interactive Patient Education [employer].

## 2015-08-31 NOTE — ED Notes (Signed)
MD at bedside. 

## 2015-08-31 NOTE — ED Notes (Signed)
Pt states he is constipated unable to have a BM since last Friday, pt states he takes stool softener on regular basis, but no having BM. Denies any pain at this time.

## 2015-08-31 NOTE — ED Provider Notes (Signed)
CSN: [id number]     Arrival date & time 08/31/15  [id number] History   First MD Initiated Contact with Patient 08/31/15 [phone]     Chief Complaint  Patient presents with  . Constipation     (Consider location/radiation/quality/duration/timing/severity/associated sxs/prior Treatment) HPI 70 year old male who presents with constipation. He has a history of CAD status post CABG and stent, paroxysmal atrial fibrillation on Coumadin, and prior CVA with left-sided hemiparesis. He has no prior abdominal surgeries. States chronic constipation, with last bowel movement 1 week ago. States that he has increased abdominal fullness. No nausea, vomiting, fevers or chills, or abdominal pain. No dysuria or urinary frequency. No chest pain or shortness of breath. Has been taking generic Colace without improvement. Past Medical History  Diagnosis Date  . GERD (gastroesophageal reflux disease)   . Gout   . Hyperlipidemia   . Hypertension   . CVA (cerebral infarction)     left hemiparesis  . Chronic pain     left sided-Kristeins  . CAD (coronary artery disease)     s/p CABG, s/p DES to LCX  January 2011  . OSA on CPAP   . Sleep apnea   . History of colonoscopy   . Stroke ([place])   . Dysrhythmia     hx of atrial fibrilation with cardioversion  . Hypothyroidism   . Diastolic CHF [facility])    Past Surgical History  Procedure Laterality Date  . Coronary artery bypass graft      stent  . Esophagogastroduodenoscopy  03-25-2005  . Nasal septum surgery    . Percutaneous placement intravascular stent cervical carotid artery      03-2009; using a drug-eluting platform of the circumflex cornoray artery with a 3.0 x 18 [employer] Promus drug-eluting platform post dilated to 3.75 with a noncompliant balloon.  . Cataract extraction  05/2010    left eye  . Cataract extraction  04/2010    right eye  . Cardioversion  06/20/2011    Procedure: CARDIOVERSION;  Surgeon: Larey Dresser, MD;  Location: [facility] ENDOSCOPY;  Service:  Cardiovascular;  Laterality: N/A;  . Tee without cardioversion  06/20/2011    Procedure: TRANSESOPHAGEAL ECHOCARDIOGRAM (TEE);  Surgeon: Larey Dresser, MD;  Location: [place];  Service: Cardiovascular;  Laterality: N/A;  . Cardioversion N/A 04/26/2015    Procedure: CARDIOVERSION;  Surgeon: Dorothy Spark, MD;  Location: [place];  Service: Cardiovascular;  Laterality: N/A;  . Cardioversion N/A 05/10/2015    Procedure: CARDIOVERSION;  Surgeon: Larey Dresser, MD;  Location: [facility] ENDOSCOPY;  Service: Cardiovascular;  Laterality: N/A;   Family History  Problem Relation Age of Onset  . Lung cancer Father     deceased  . Stroke Mother     deceased-MINISTROKES   Social History  Substance Use Topics  . Smoking status: Former Research scientist (life sciences)  . Smokeless tobacco: Never Used  . Alcohol Use: No     Comment: 1 beer a month    Review of Systems  Constitutional: Negative for fever.  Respiratory: Negative for shortness of breath.   Cardiovascular: Negative for chest pain.  Gastrointestinal: Negative for nausea, vomiting and abdominal pain.  Genitourinary: Negative for difficulty urinating.  All other systems reviewed and are negative.     Allergies  Review of patient's allergies indicates no known allergies.  Home Medications   Prior to Admission medications   Medication Sig Start Date End Date Taking? Authorizing Provider  allopurinol (ZYLOPRIM) 100 MG tablet TAKE 2 TABLETS BY MOUTH DAILY. D/C PREVIOUS  SCRIPTS FOR THIS MEDICATION 07/10/15  Yes Debbrah Alar, NP  amiodarone (PACERONE) 200 MG tablet Take 200 mg by mouth daily.   Yes Historical Provider, MD  atorvastatin (LIPITOR) 40 MG tablet Take two tablets ( 80 mg total ) by mouth daily   Yes Historical Provider, MD  benazepril (LOTENSIN) 10 MG tablet Take 1 tablet (10 mg total) by mouth 2 (two) times daily. 06/22/15  Yes Scott Joylene Draft, PA-C  docusate sodium (COLACE) 100 MG capsule Take 1 capsule (100 mg total) by mouth 2 (two)  times daily. 04/27/15  Yes Jule Ser, DO  fluticasone (FLONASE) 50 MCG/ACT nasal spray Place 2 sprays into both nostrils daily. 06/02/14  Yes Brunetta Jeans, PA-C  furosemide (LASIX) 40 MG tablet TAKE 1 TABLET BY MOUTH EVERY OTHER DAY 02/20/15  Yes Debbrah Alar, NP  levothyroxine (SYNTHROID, LEVOTHROID) 112 MCG tablet TAKE 1 TABLET (112 MCG TOTAL) BY MOUTH DAILY BEFORE BREAKFAST. 06/04/15  Yes Debbrah Alar, NP  metFORMIN (GLUCOPHAGE) 500 MG tablet TAKE 1 TABLET EVERY DAY WITH BREAKFAST 06/04/15  Yes Debbrah Alar, NP  metoprolol succinate (TOPROL-XL) 25 MG 24 hr tablet Take 12.5 mg by mouth at bedtime. 08/02/15  Yes Historical Provider, MD  Multiple Vitamins-Minerals (MULTIVITAMIN GUMMIES MENS PO) Take 1 tablet by mouth daily.   Yes Historical Provider, MD  nitroGLYCERIN (NITROSTAT) 0.4 MG SL tablet Place 1 tablet (0.4 mg total) under the tongue every 5 (five) minutes as needed for chest pain. Up to 3 doses 03/05/15  Yes Debbrah Alar, NP  pantoprazole (PROTONIX) 40 MG tablet Take 1 tablet (40 mg total) by mouth daily. 03/05/15  Yes Debbrah Alar, NP  traMADol (ULTRAM) 50 MG tablet Take 1 tablet (50 mg total) by mouth 2 (two) times daily. 07/03/15  Yes Charlett Blake, MD  traMADol (ULTRAM-ER) 200 MG 24 hr tablet TAKE 1 TABLET EVERY DAY WITH AM DOSE OF TRAMADOL 50 MG 07/03/15  Yes Charlett Blake, MD  vitamin B-12 1000 MCG tablet Take 1 tablet (1,000 mcg total) by mouth daily. 08/11/13  Yes Orson Eva, MD  warfarin (COUMADIN) 2.5 MG tablet TAKE AS DIRECTED BY ANTICOAGULATION CLINIC Patient taking differently: Take 2.5 mg by mouth daily in the morning. 05/07/15  Yes Larey Dresser, MD   BP 172/90 mmHg  Pulse 64  Temp(Src) 98.7 F (37.1 C) (Oral)  Resp 19  Ht 6\' 3"  (1.905 m)  Wt 256 lb (116.121 kg)  BMI 32.00 kg/m2  SpO2 96% Physical Exam Physical Exam  Nursing note and vitals reviewed. Constitutional: Well developed, well nourished, non-toxic, and in no acute  distress Head: Normocephalic and atraumatic.  Mouth/Throat: Oropharynx is clear and moist.  Neck: Normal range of motion. Neck supple.  Cardiovascular: Normal rate and regular rhythm.   Pulmonary/Chest: Effort normal and breath sounds normal.  Abdominal: Soft. Mild distension. There is no tenderness. There is no rebound and no guarding.  no fecal impaction on rectal exam, but minimal hard stool present. No gross blood  Musculoskeletal: Normal range of motion.  Neurological: Alert, no facial droop, fluent speech, moves all extremities symmetrically Skin: Skin is warm and dry.  Psychiatric: Cooperative  ED Course  Procedures (including critical care time) Labs Review Labs Reviewed  CBC WITH DIFFERENTIAL/PLATELET  COMPREHENSIVE METABOLIC PANEL    Imaging Review No results found. I have personally reviewed and evaluated these images and lab results as part of my medical decision-making.   EKG Interpretation None      MDM   Final diagnoses:  None    History of chronic constipation with no prior abdominal surgeries who presents with constipation. Vital signs are non-concerning, he is nontoxic and in no acute distress. Abdomen is soft and benign. No fecal impaction on rectal exam. We'll obtain blood work and x-ray of the abdomen given his age and constipation to rule out volvulus or other obstruction, although clinically I have low suspicion for this. XR without obstructive pattern and basic blood unremarkable aside from mild AKI. Encouraged oral fluids. Given enema, and has a large bowel movement, feeling significantly improved. The patient to continue senna, Colace, MiraLAX at home with follow-up with PCP. Strict return and follow-up instructions reviewed. He expressed understanding of all discharge instructions and felt comfortable with the plan of care.    Forde Dandy, MD 08/31/15 1134

## 2015-09-01 ENCOUNTER — Other Ambulatory Visit: Payer: Self-pay | Admitting: Cardiology

## 2015-09-05 ENCOUNTER — Telehealth: Payer: Self-pay | Admitting: Cardiology

## 2015-09-05 ENCOUNTER — Telehealth: Payer: Self-pay | Admitting: Family

## 2015-09-05 DIAGNOSIS — K5900 Constipation, unspecified: Secondary | ICD-10-CM

## 2015-09-05 DIAGNOSIS — K649 Unspecified hemorrhoids: Secondary | ICD-10-CM

## 2015-09-05 NOTE — Telephone Encounter (Signed)
New Message:  Pt is calling in wanting Dr. Aundra Dubin to write him a  referral to a Proctologist to have a hemorrhoid removed. Please f/u with pt.

## 2015-09-05 NOTE — Telephone Encounter (Signed)
Will forward to Dr. Aundra Dubin for order for referral.

## 2015-09-05 NOTE — Telephone Encounter (Signed)
[facility] should be able to do that, refer to them.

## 2015-09-05 NOTE — Telephone Encounter (Signed)
Relation to [id number] Call back number:[phone]   Reason for call:  Patient was seen Friday 08/31/15 in the ED due to loose bowels. Patient can't schedule a ED follow up due to not being able to hold he's bowels. Patient requesting a referral to a proctologist. Please advise

## 2015-09-06 NOTE — Telephone Encounter (Signed)
Spoke with pt and informed him that [place] said [facility] should be able to do hemorrhoid removal. Referral sent to [facility]. Pt verbalized understanding.

## 2015-09-06 NOTE — Telephone Encounter (Signed)
It looks like ED gave him laxatives (miralax and senokot). Is he still taking these? If so, he should discontinue. I will arrange follow up with his GI specialist, Dr. Fuller Plan.  In the meantime, if diarrhea does not improve, will need to be seen in our office sooner.

## 2015-09-11 ENCOUNTER — Telehealth: Payer: Self-pay | Admitting: *Deleted

## 2015-09-11 MED ORDER — FUROSEMIDE 40 MG PO TABS: 40.0000 mg | ORAL_TABLET | ORAL | Status: DC

## 2015-09-11 NOTE — Telephone Encounter (Signed)
Notified pt of below. States he has not been contacted by GI yet. Saw referral note that GI left message for pt to call back to schedule appt on 09/06/15. Gave pt phone # to call GI re: appt. Pt states he had stopped laxatives from ER, loose stools resolved. States he has an external hemorrhoid that he wanted referral for. Advised pt that GI may be able to assist with that and if not they could set him up with someone that can.

## 2015-09-11 NOTE — Telephone Encounter (Signed)
Request from [facility] for furosemide 40mg  every other day. Refills sent.

## 2015-09-12 ENCOUNTER — Encounter: Payer: Self-pay | Admitting: Gastroenterology

## 2015-09-12 NOTE — Telephone Encounter (Signed)
Noted  

## 2015-09-13 ENCOUNTER — Other Ambulatory Visit: Payer: Self-pay | Admitting: Family

## 2015-09-14 ENCOUNTER — Ambulatory Visit (INDEPENDENT_AMBULATORY_CARE_PROVIDER_SITE_OTHER): Payer: Medicare Other | Admitting: *Deleted

## 2015-09-14 DIAGNOSIS — I4891 Unspecified atrial fibrillation: Secondary | ICD-10-CM | POA: Diagnosis not present

## 2015-09-14 DIAGNOSIS — Z7901 Long term (current) use of anticoagulants: Secondary | ICD-10-CM

## 2015-09-14 LAB — POCT INR: INR: 2.4

## 2015-09-14 NOTE — Telephone Encounter (Signed)
Duplicate; Rx sent 09/11/15 #45 w/1 refill/SLS 06/30

## 2015-09-15 ENCOUNTER — Other Ambulatory Visit: Payer: Self-pay | Admitting: Family

## 2015-09-17 NOTE — Telephone Encounter (Signed)
Please let pt know that I reviewed his records and it appears that cardiology stopped at his discharge back on 2/23. He is supposed to be on amiodarone instead.  If he has any further questions he should contact cardiology.

## 2015-09-17 NOTE — Telephone Encounter (Signed)
Rx was D/C at discharge in medication Hx on 05/10/15, but has remained off of medication list with PCP and Cardiology; please Advise on refills/SLS 07/03

## 2015-10-02 ENCOUNTER — Encounter: Payer: Self-pay | Admitting: Physical Medicine & Rehabilitation

## 2015-10-02 ENCOUNTER — Ambulatory Visit (HOSPITAL_BASED_OUTPATIENT_CLINIC_OR_DEPARTMENT_OTHER): Payer: Medicare Other | Admitting: Physical Medicine & Rehabilitation

## 2015-10-02 ENCOUNTER — Encounter: Payer: Medicare Other | Attending: Physical Medicine & Rehabilitation

## 2015-10-02 VITALS — BP 113/73 | HR 76 | Resp 14

## 2015-10-02 DIAGNOSIS — G8110 Spastic hemiplegia affecting unspecified side: Secondary | ICD-10-CM | POA: Diagnosis not present

## 2015-10-02 DIAGNOSIS — I69354 Hemiplegia and hemiparesis following cerebral infarction affecting left non-dominant side: Secondary | ICD-10-CM | POA: Insufficient documentation

## 2015-10-02 NOTE — Progress Notes (Signed)
Botox Injection for spasticity using needle EMG guidance  Dilution: 50 Units/ml Indication: Severe spasticity which interferes with ADL,mobility and/or  hygiene and is unresponsive to medication management and other conservative care Informed consent was obtained after describing risks and benefits of the procedure with the patient. This includes bleeding, bruising, infection, excessive weakness, or medication side effects. A REMS form is on file and signed. Needle: 27g 1" needle electrode Number of units per muscle Biceps75 FCR50   FCU0   FDS25  FDP25 Gastrosoleus0   Hamstrings 200, long head,  25U Short head biceps femoris    All injections were done after obtaining appropriate EMG activity and after negative drawback for blood. The patient tolerated the procedure well. Post procedure instructions were given. A followup appointment was made. Repeat in 22mo

## 2015-10-02 NOTE — Patient Instructions (Signed)
You received a Botox injection today. You may experience soreness at the needle injection sites. Please call us if any of the injection sites turns red after a couple days or if there is any drainage. You may experience muscle weakness as a result of Botox. This would improve with time but can take several weeks to improve. The Botox should start working in about one week. The Botox usually last 3 months. The injection can be repeated every 3 months as needed.  Biceps75 FCR50   FCU0   FDS25  FDP50 Gastrosoleus0   Hamstrings 200

## 2015-10-03 ENCOUNTER — Ambulatory Visit: Payer: Medicare Other | Admitting: Cardiology

## 2015-10-21 ENCOUNTER — Other Ambulatory Visit: Payer: Self-pay | Admitting: Family

## 2015-10-22 ENCOUNTER — Inpatient Hospital Stay (HOSPITAL_COMMUNITY)
Admission: EM | Admit: 2015-10-22 | Discharge: 2015-10-26 | DRG: 287 | Disposition: A | Discharge status: Home / Self Care | Payer: Medicare Other | Attending: Cardiovascular Disease | Admitting: Cardiovascular Disease

## 2015-10-22 ENCOUNTER — Emergency Department (HOSPITAL_COMMUNITY): Payer: Medicare Other

## 2015-10-22 ENCOUNTER — Encounter (HOSPITAL_COMMUNITY): Payer: Self-pay | Admitting: Emergency Medicine

## 2015-10-22 DIAGNOSIS — I480 Paroxysmal atrial fibrillation: Secondary | ICD-10-CM | POA: Diagnosis present

## 2015-10-22 DIAGNOSIS — I110 Hypertensive heart disease with heart failure: Secondary | ICD-10-CM | POA: Diagnosis present

## 2015-10-22 DIAGNOSIS — I25110 Atherosclerotic heart disease of native coronary artery with unstable angina pectoris: Secondary | ICD-10-CM | POA: Diagnosis not present

## 2015-10-22 DIAGNOSIS — I5032 Chronic diastolic (congestive) heart failure: Secondary | ICD-10-CM | POA: Diagnosis not present

## 2015-10-22 DIAGNOSIS — Z87891 Personal history of nicotine dependence: Secondary | ICD-10-CM

## 2015-10-22 DIAGNOSIS — I4811 Longstanding persistent atrial fibrillation: Secondary | ICD-10-CM | POA: Diagnosis present

## 2015-10-22 DIAGNOSIS — Z951 Presence of aortocoronary bypass graft: Secondary | ICD-10-CM

## 2015-10-22 DIAGNOSIS — Z7951 Long term (current) use of inhaled steroids: Secondary | ICD-10-CM

## 2015-10-22 DIAGNOSIS — I69354 Hemiplegia and hemiparesis following cerebral infarction affecting left non-dominant side: Secondary | ICD-10-CM | POA: Diagnosis not present

## 2015-10-22 DIAGNOSIS — K219 Gastro-esophageal reflux disease without esophagitis: Secondary | ICD-10-CM | POA: Diagnosis present

## 2015-10-22 DIAGNOSIS — M109 Gout, unspecified: Secondary | ICD-10-CM | POA: Diagnosis present

## 2015-10-22 DIAGNOSIS — G4733 Obstructive sleep apnea (adult) (pediatric): Secondary | ICD-10-CM | POA: Diagnosis present

## 2015-10-22 DIAGNOSIS — I10 Essential (primary) hypertension: Secondary | ICD-10-CM | POA: Diagnosis present

## 2015-10-22 DIAGNOSIS — R0602 Shortness of breath: Secondary | ICD-10-CM | POA: Diagnosis not present

## 2015-10-22 DIAGNOSIS — I208 Other forms of angina pectoris: Secondary | ICD-10-CM | POA: Diagnosis not present

## 2015-10-22 DIAGNOSIS — R079 Chest pain, unspecified: Secondary | ICD-10-CM

## 2015-10-22 DIAGNOSIS — Z7901 Long term (current) use of anticoagulants: Secondary | ICD-10-CM

## 2015-10-22 DIAGNOSIS — I2089 Other forms of angina pectoris: Secondary | ICD-10-CM

## 2015-10-22 DIAGNOSIS — Z9842 Cataract extraction status, left eye: Secondary | ICD-10-CM

## 2015-10-22 DIAGNOSIS — Z7984 Long term (current) use of oral hypoglycemic drugs: Secondary | ICD-10-CM

## 2015-10-22 DIAGNOSIS — R0789 Other chest pain: Secondary | ICD-10-CM | POA: Diagnosis not present

## 2015-10-22 DIAGNOSIS — I6930 Unspecified sequelae of cerebral infarction: Secondary | ICD-10-CM

## 2015-10-22 DIAGNOSIS — E119 Type 2 diabetes mellitus without complications: Secondary | ICD-10-CM

## 2015-10-22 DIAGNOSIS — R42 Dizziness and giddiness: Secondary | ICD-10-CM | POA: Diagnosis not present

## 2015-10-22 DIAGNOSIS — E785 Hyperlipidemia, unspecified: Secondary | ICD-10-CM | POA: Diagnosis present

## 2015-10-22 DIAGNOSIS — Z9841 Cataract extraction status, right eye: Secondary | ICD-10-CM

## 2015-10-22 DIAGNOSIS — E039 Hypothyroidism, unspecified: Secondary | ICD-10-CM | POA: Diagnosis present

## 2015-10-22 DIAGNOSIS — Z955 Presence of coronary angioplasty implant and graft: Secondary | ICD-10-CM

## 2015-10-22 DIAGNOSIS — I4819 Other persistent atrial fibrillation: Secondary | ICD-10-CM | POA: Diagnosis present

## 2015-10-22 HISTORY — DX: Other forms of angina pectoris: I20.8

## 2015-10-22 HISTORY — DX: Other forms of angina pectoris: I20.89

## 2015-10-22 LAB — CBC
HEMATOCRIT: 45.3 % (ref 39.0–52.0)
HEMOGLOBIN: 14.2 g/dL (ref 13.0–17.0)
MCH: 29.5 pg (ref 26.0–34.0)
MCHC: 31.3 g/dL (ref 30.0–36.0)
MCV: 94 fL (ref 78.0–100.0)
Platelets: 184 10*3/uL (ref 150–400)
RBC: 4.82 MIL/uL (ref 4.22–5.81)
RDW: 13.5 % (ref 11.5–15.5)
WBC: 5.6 10*3/uL (ref 4.0–10.5)

## 2015-10-22 LAB — BASIC METABOLIC PANEL
ANION GAP: 10 (ref 5–15)
BUN: 17 mg/dL (ref 6–20)
CHLORIDE: 101 mmol/L (ref 101–111)
CO2: 29 mmol/L (ref 22–32)
Calcium: 9.7 mg/dL (ref 8.9–10.3)
Creatinine, Ser: 1.12 mg/dL (ref 0.61–1.24)
GFR calc Af Amer: >60 mL/min (ref >60)
GFR calc non Af Amer: >60 mL/min (ref >60)
Glucose, Bld: 108 mg/dL — ABNORMAL HIGH (ref 65–99)
POTASSIUM: 4 mmol/L (ref 3.5–5.1)
SODIUM: 140 mmol/L (ref 135–145)

## 2015-10-22 LAB — TSH: TSH: 0.768 u[IU]/mL (ref 0.350–4.500)

## 2015-10-22 LAB — PROTIME-INR
INR: 2.58
PROTHROMBIN TIME: 28.2 s — AB (ref 11.4–15.2)

## 2015-10-22 LAB — GLUCOSE, CAPILLARY: GLUCOSE-CAPILLARY: 73 mg/dL (ref 65–99)

## 2015-10-22 LAB — I-STAT TROPONIN, ED: Troponin i, poc: 0.01 ng/mL (ref 0.00–0.08)

## 2015-10-22 LAB — MRSA PCR SCREENING: MRSA by PCR: NEGATIVE

## 2015-10-22 LAB — TROPONIN I

## 2015-10-22 LAB — MAGNESIUM: MAGNESIUM: 2.1 mg/dL (ref 1.7–2.4)

## 2015-10-22 MED ORDER — BENAZEPRIL HCL 10 MG PO TABS
10.0000 mg | ORAL_TABLET | Freq: Every day | ORAL | Status: DC
  Administered 2015-10-23 – 2015-10-26 (×4): 10 mg via ORAL
  Filled 2015-10-22 (×5): qty 1

## 2015-10-22 MED ORDER — ALLOPURINOL 100 MG PO TABS
100.0000 mg | ORAL_TABLET | Freq: Two times a day (BID) | ORAL | Status: DC
  Administered 2015-10-23 – 2015-10-26 (×7): 100 mg via ORAL
  Filled 2015-10-22 (×8): qty 1

## 2015-10-22 MED ORDER — AMIODARONE HCL 200 MG PO TABS
200.0000 mg | ORAL_TABLET | Freq: Every day | ORAL | Status: DC
  Administered 2015-10-23 – 2015-10-26 (×4): 200 mg via ORAL
  Filled 2015-10-22 (×4): qty 1

## 2015-10-22 MED ORDER — ADULT MULTIVITAMIN W/MINERALS CH
1.0000 | ORAL_TABLET | Freq: Every day | ORAL | Status: DC
  Administered 2015-10-23 – 2015-10-26 (×3): 1 via ORAL
  Filled 2015-10-22 (×3): qty 1

## 2015-10-22 MED ORDER — NITROGLYCERIN IN D5W 200-5 MCG/ML-% IV SOLN
0.0000 ug/min | INTRAVENOUS | Status: DC
  Administered 2015-10-22: 5 ug/min via INTRAVENOUS
  Filled 2015-10-22: qty 250

## 2015-10-22 MED ORDER — PANTOPRAZOLE SODIUM 40 MG PO TBEC
40.0000 mg | DELAYED_RELEASE_TABLET | Freq: Every day | ORAL | Status: DC
  Administered 2015-10-23 – 2015-10-26 (×4): 40 mg via ORAL
  Filled 2015-10-22 (×4): qty 1

## 2015-10-22 MED ORDER — ACETAMINOPHEN 325 MG PO TABS: 650.0000 mg | ORAL_TABLET | ORAL | Status: DC | PRN

## 2015-10-22 MED ORDER — MULTIVITAMIN GUMMIES MENS PO CHEW: CHEWABLE_TABLET | Freq: Every day | ORAL | Status: DC

## 2015-10-22 MED ORDER — ATORVASTATIN CALCIUM 40 MG PO TABS
40.0000 mg | ORAL_TABLET | Freq: Every day | ORAL | Status: DC
  Administered 2015-10-22 – 2015-10-25 (×4): 40 mg via ORAL
  Filled 2015-10-22 (×4): qty 1

## 2015-10-22 MED ORDER — NITROGLYCERIN 0.4 MG SL SUBL
0.4000 mg | SUBLINGUAL_TABLET | SUBLINGUAL | Status: DC | PRN
  Administered 2015-10-22 (×2): 0.4 mg via SUBLINGUAL
  Filled 2015-10-22 (×2): qty 1

## 2015-10-22 MED ORDER — SODIUM CHLORIDE 0.9 % IV SOLN
INTRAVENOUS | Status: DC
  Administered 2015-10-22: 21:00:00 via INTRAVENOUS

## 2015-10-22 MED ORDER — FUROSEMIDE 40 MG PO TABS
40.0000 mg | ORAL_TABLET | ORAL | Status: DC
  Administered 2015-10-23: 40 mg via ORAL
  Filled 2015-10-22 (×2): qty 1

## 2015-10-22 MED ORDER — TRAMADOL HCL ER 200 MG PO TB24: 200.0000 mg | ORAL_TABLET | Freq: Every day | ORAL | Status: DC

## 2015-10-22 MED ORDER — INSULIN ASPART 100 UNIT/ML ~~LOC~~ SOLN: 0.0000 [IU] | Freq: Three times a day (TID) | SUBCUTANEOUS | Status: DC

## 2015-10-22 MED ORDER — ONDANSETRON HCL 4 MG/2ML IJ SOLN: 4.0000 mg | Freq: Four times a day (QID) | INTRAMUSCULAR | Status: DC | PRN

## 2015-10-22 MED ORDER — ASPIRIN 81 MG PO CHEW
324.0000 mg | CHEWABLE_TABLET | Freq: Once | ORAL | Status: AC
  Administered 2015-10-22: 324 mg via ORAL
  Filled 2015-10-22: qty 4

## 2015-10-22 MED ORDER — TRAMADOL HCL 50 MG PO TABS
50.0000 mg | ORAL_TABLET | Freq: Four times a day (QID) | ORAL | Status: DC
  Administered 2015-10-22 – 2015-10-26 (×14): 50 mg via ORAL
  Filled 2015-10-22 (×14): qty 1

## 2015-10-22 MED ORDER — VITAMIN B-12 1000 MCG PO TABS
1000.0000 ug | ORAL_TABLET | Freq: Every day | ORAL | Status: DC
  Administered 2015-10-23 – 2015-10-26 (×4): 1000 ug via ORAL
  Filled 2015-10-22 (×4): qty 1

## 2015-10-22 MED ORDER — TRAMADOL HCL 50 MG PO TABS: 50.0000 mg | ORAL_TABLET | Freq: Two times a day (BID) | ORAL | Status: DC | PRN

## 2015-10-22 MED ORDER — ASPIRIN EC 81 MG PO TBEC
81.0000 mg | DELAYED_RELEASE_TABLET | Freq: Every day | ORAL | Status: DC
  Administered 2015-10-23 – 2015-10-25 (×3): 81 mg via ORAL
  Filled 2015-10-22 (×4): qty 1

## 2015-10-22 MED ORDER — LEVOTHYROXINE SODIUM 112 MCG PO TABS
112.0000 ug | ORAL_TABLET | Freq: Every day | ORAL | Status: DC
  Administered 2015-10-23 – 2015-10-26 (×3): 112 ug via ORAL
  Filled 2015-10-22 (×4): qty 1

## 2015-10-22 MED ORDER — FLUTICASONE PROPIONATE 50 MCG/ACT NA SUSP
2.0000 | Freq: Every day | NASAL | Status: DC
  Administered 2015-10-23 – 2015-10-26 (×3): 2 via NASAL
  Filled 2015-10-22: qty 16

## 2015-10-22 NOTE — Telephone Encounter (Signed)
Last refill 07/10/15 #180 refill 1. No uric acid on file. Please advise if medication is appropriate for fill.

## 2015-10-22 NOTE — ED Notes (Signed)
Patient undressed, in gown, on monitor, continuous pulse oximetry and blood pressure cuff 

## 2015-10-22 NOTE — Progress Notes (Signed)
ANTICOAGULATION CONSULT NOTE - Initial Consult  Pharmacy Consult for heparin Indication: chest pain/ACS  No Known Allergies  Patient Measurements: Height: 6\' 3"  (190.5 cm) Weight: 255 lb (115.7 kg) IBW/kg (Calculated) : 84.5 Heparin Dosing Weight: 104.6 kg  Vital Signs: Temp: 98.2 F (36.8 C) (08/07 1516) Temp Source: Oral (08/07 1516) BP: 126/73 (08/07 1700) Pulse Rate: 57 (08/07 1700)  Labs:  Recent Labs  10/22/15 1310 10/22/15 1839  HGB 14.2  --   HCT 45.3  --   PLT 184  --   LABPROT  --  28.2*  INR  --  2.58  CREATININE 1.12  --     Estimated Creatinine Clearance: 81.8 mL/min (by C-G formula based on SCr of 1.12 mg/dL).   Medical History: Past Medical History:  Diagnosis Date  . CAD (coronary artery disease)    s/p CABG, s/p DES to LCX  January 2011  . Chronic pain    left sided-Kristeins  . CVA (cerebral infarction)    left hemiparesis  . Diastolic CHF ([place])   . Dysrhythmia    hx of atrial fibrilation with cardioversion  . GERD (gastroesophageal reflux disease)   . Gout   . History of colonoscopy   . Hyperlipidemia   . Hypertension   . Hypothyroidism   . OSA on CPAP   . Sleep apnea   . Stroke [facility])     Assessment: 62 YOM presenting for chest pain. INR currently 2.58. On warfarin PTA for A-fib and held inpatient. Plan to do nuc study tomorrow if enzymes are negative. Last dose of coumadin on 8/7. CBC stable.   Goal of Therapy:  Heparin level 0.3-0.7 units/ml Monitor platelets by anticoagulation protocol: Yes   Plan:   Start heparin when INR <2 Check daily INR   [name]), PharmD  PGY1 Pharmacy Resident Pager: [phone] 10/22/2015 7:22 PM

## 2015-10-22 NOTE — ED Notes (Signed)
Patient taken back to room via wheelchair and is now getting undressed and into a gown at this time

## 2015-10-22 NOTE — ED Notes (Signed)
Regenia Skeeter, MD present in room speaking to patient at this time

## 2015-10-22 NOTE — ED Provider Notes (Signed)
[facility] DEPT Provider Note   CSN: [id number] Arrival date & time: 10/22/15  1258  First Provider Contact:  First MD Initiated Contact with Patient 10/22/15 1558        History   Chief Complaint Chief Complaint  Patient presents with  . Chest Pain  . Weakness    HPI [name] is a 70 y.o. male presenting with chest pain, dizziness, and weakness. He is having a hard time describing what the pain feels like but it is in the middle this chest. It has been constant for the last couple days although he cannot tell me exactly when it started. He states he gets worse whenever he exerts himself. However he also has noticed that he is weaker than normal. He has chronic left-sided weakness from a stroke several years ago but he is having even a harder time getting around and feels overall fatigued. There is no new focal weakness. He is also short of breath, especially when getting up and moving. Patient states that he had a CABG in 2004 with stents but has not had any coronary issues since. He has had issues with A. fib but does not feel like he is in A. fib now. Pain is currently a 5/10.  HPI  Past Medical History:  Diagnosis Date  . CAD (coronary artery disease)    s/p CABG, s/p DES to LCX  January 2011  . Chronic pain    left sided-Kristeins  . CVA (cerebral infarction)    left hemiparesis  . Diastolic CHF ([place])   . Dysrhythmia    hx of atrial fibrilation with cardioversion  . GERD (gastroesophageal reflux disease)   . Gout   . History of colonoscopy   . Hyperlipidemia   . Hypertension   . Hypothyroidism   . OSA on CPAP   . Sleep apnea   . Stroke [facility])     Patient Active Problem List   Diagnosis Date Noted  . Atrial fibrillation ([place]) 05/09/2015  . Atrial fibrillation with rapid ventricular response ([place])   . CAD S/P CFX DES 2011 04/25/2015  . PAF - CAHDs VASc=7 04/25/2015  . Vasovagal syncope   . Chest pain 04/24/2015  . Constipation 04/24/2015  . Diastolic  CHF -EF [id number] with grade 2 DD 2014 04/24/2015  . Dizziness and giddiness 08/16/2014  . B12 deficiency 08/11/2013  . Sensory disturbance 08/11/2013  . Left-sided weakness 08/10/2013  . TIA (transient ischemic attack) 08/10/2013  . Ataxia 08/10/2013  . Insomnia 05/21/2013  . Hypothyroidism 02/15/2013  . Fatigue 11/17/2012  . Spastic hemiplegia affecting nondominant side ([place]) 07/21/2011  . Atrial fibrillation with RVR ([place]) 06/24/2011  . Long term (current) use of anticoagulants 06/24/2011  . Hypogonadism male 04/10/2011  . Allergic rhinitis 02/13/2010  . BENIGN POSITIONAL VERTIGO 11/23/2009  . 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 Diabetes type 2, controlled ([place]) 10/05/2007  . Obstructive sleep apnea-Failed CPAP 06/17/2007  . Hyperlipidemia 12/12/2006  . GOUT 12/12/2006  . Essential hypertension 12/12/2006  . History of stroke with residual deficit 12/12/2006  . GERD 12/12/2006    Past Surgical History:  Procedure Laterality Date  . CARDIOVERSION  06/20/2011   Procedure: CARDIOVERSION;  Surgeon: Larey Dresser, MD;  Location: [facility] ENDOSCOPY;  Service: Cardiovascular;  Laterality: N/A;  . CARDIOVERSION N/A 04/26/2015   Procedure: CARDIOVERSION;  Surgeon: Dorothy Spark, MD;  Location: [facility] ENDOSCOPY;  Service: Cardiovascular;  Laterality: N/A;  . CARDIOVERSION N/A 05/10/2015   Procedure: CARDIOVERSION;  Surgeon: Kirk Ruths  Claris Gladden, MD;  Location: [facility];  Service: Cardiovascular;  Laterality: N/A;  . CATARACT EXTRACTION  05/2010   left eye  . CATARACT EXTRACTION  04/2010   right eye  . CORONARY ARTERY BYPASS GRAFT     stent  . ESOPHAGOGASTRODUODENOSCOPY  03-25-2005  . NASAL SEPTUM SURGERY    . PERCUTANEOUS PLACEMENT INTRAVASCULAR STENT CERVICAL CAROTID ARTERY     03-2009; using a drug-eluting platform of the circumflex cornoray artery with a 3.0 x 18 [employer] Promus drug-eluting platform post dilated to 3.75 with a noncompliant balloon.  . TEE WITHOUT CARDIOVERSION  06/20/2011    Procedure: TRANSESOPHAGEAL ECHOCARDIOGRAM (TEE);  Surgeon: Larey Dresser, MD;  Location: [facility];  Service: Cardiovascular;  Laterality: N/A;       Home Medications    Prior to Admission medications   Medication Sig Start Date End Date Taking? Authorizing Provider  allopurinol (ZYLOPRIM) 100 MG tablet TAKE 2 TABLETS BY MOUTH DAILY. D/C PREVIOUS SCRIPTS FOR THIS MEDICATION 07/10/15   Debbrah Alar, NP  amiodarone (PACERONE) 200 MG tablet Take 200 mg by mouth daily.    Historical Provider, MD  atorvastatin (LIPITOR) 40 MG tablet Take two tablets ( 80 mg total ) by mouth daily    Historical Provider, MD  benazepril (LOTENSIN) 10 MG tablet Take 1 tablet (10 mg total) by mouth 2 (two) times daily. 06/22/15   Liliane Shi, PA-C  docusate sodium (COLACE) 100 MG capsule Take 1 capsule (100 mg total) by mouth 2 (two) times daily. 04/27/15   Jule Ser, DO  fluticasone (FLONASE) 50 MCG/ACT nasal spray Place 2 sprays into both nostrils daily. 06/02/14   Brunetta Jeans, PA-C  furosemide (LASIX) 40 MG tablet Take 1 tablet (40 mg total) by mouth every other day. 09/11/15   Debbrah Alar, NP  levothyroxine (SYNTHROID, LEVOTHROID) 112 MCG tablet TAKE 1 TABLET (112 MCG TOTAL) BY MOUTH DAILY BEFORE BREAKFAST. 06/04/15   Debbrah Alar, NP  metFORMIN (GLUCOPHAGE) 500 MG tablet TAKE 1 TABLET EVERY DAY WITH BREAKFAST 06/04/15   Debbrah Alar, NP  metoprolol succinate (TOPROL-XL) 25 MG 24 hr tablet Take 12.5 mg by mouth at bedtime. 08/02/15   Historical Provider, MD  Multiple Vitamins-Minerals (MULTIVITAMIN GUMMIES MENS PO) Take 1 tablet by mouth daily.    Historical Provider, MD  nitroGLYCERIN (NITROSTAT) 0.4 MG SL tablet Place 1 tablet (0.4 mg total) under the tongue every 5 (five) minutes as needed for chest pain. Up to 3 doses 03/05/15   Debbrah Alar, NP  pantoprazole (PROTONIX) 40 MG tablet Take 1 tablet (40 mg total) by mouth daily. 03/05/15   Debbrah Alar, NP    polyethylene glycol powder (GLYCOLAX/MIRALAX) powder Take 17 g by mouth daily. Dissolve one capful of powder into any drink and take once daily. If no good effect after 2 days, take two times daily. If no good effect after 2 days, take three times daily. 08/31/15   Forde Dandy, MD  senna (SENOKOT) 8.6 MG TABS tablet Take 1 tablet (8.6 mg total) by mouth daily. 08/31/15   Forde Dandy, MD  traMADol (ULTRAM) 50 MG tablet Take 1 tablet (50 mg total) by mouth 2 (two) times daily. 07/03/15   Charlett Blake, MD  traMADol (ULTRAM-ER) 200 MG 24 hr tablet TAKE 1 TABLET EVERY DAY WITH AM DOSE OF TRAMADOL 50 MG 07/03/15   Charlett Blake, MD  vitamin B-12 1000 MCG tablet Take 1 tablet (1,000 mcg total) by mouth daily. 08/11/13  Orson Eva, MD  warfarin (COUMADIN) 2.5 MG tablet TAKE AS DIRECTED BY ANTICOAGULATION CLINIC 09/03/15   Larey Dresser, MD    Family History Family History  Problem Relation Age of Onset  . Lung cancer Father     deceased  . Stroke Mother     deceased-MINISTROKES    Social History Social History  Substance Use Topics  . Smoking status: Former Research scientist (life sciences)  . Smokeless tobacco: Never Used  . Alcohol use No     Comment: 1 beer a month     Allergies   Review of patient's allergies indicates no known allergies.   Review of Systems Review of Systems  Constitutional: Positive for fatigue.  Respiratory: Positive for shortness of breath.   Cardiovascular: Positive for chest pain.  Neurological: Positive for dizziness and weakness.  All other systems reviewed and are negative.    Physical Exam Updated Vital Signs BP 153/73 (BP Location: Right Arm)   Pulse 63   Temp 98.2 F (36.8 C) (Oral)   Resp 15   Ht 6\' 3"  (1.905 m)   Wt 255 lb (115.7 kg)   SpO2 100%   BMI 31.87 kg/m   Physical Exam  Constitutional: He is oriented to person, place, and time. He appears well-developed and well-nourished.  HENT:  Head: Normocephalic and atraumatic.  Right Ear: External  ear normal.  Left Ear: External ear normal.  Nose: Nose normal.  Eyes: Right eye exhibits no discharge. Left eye exhibits no discharge.  Neck: Neck supple.  Cardiovascular: Normal rate, regular rhythm, normal heart sounds and intact distal pulses.   Pulses:      Radial pulses are 2+ on the right side, and 2+ on the left side.       Dorsalis pedis pulses are 2+ on the right side, and 2+ on the left side.  Pulmonary/Chest: Effort normal and breath sounds normal. He exhibits no tenderness.  Abdominal: Soft. There is no tenderness.  Musculoskeletal: He exhibits no edema.  LLE with some atrophy. Neither leg with swelling/edema  Neurological: He is alert and oriented to person, place, and time.  Skin: Skin is warm and dry.  Nursing note and vitals reviewed.    ED Treatments / Results  Labs (all labs ordered are listed, but only abnormal results are displayed) Labs Reviewed  BASIC METABOLIC PANEL - Abnormal; Notable for the following:       Result Value   Glucose, Bld 108 (*)    All other components within normal limits  CBC  I-STAT TROPOININ, ED    EKG  EKG Interpretation  Date/Time:  Monday October 22 2015 13:02:12 EDT Ventricular Rate:  74 PR Interval:  168 QRS Duration: 94 QT Interval:  356 QTC Calculation: 395 R Axis:   -52 Text Interpretation:  Normal sinus rhythm Left anterior fascicular block Left ventricular hypertrophy Nonspecific T wave abnormality Abnormal ECG T wave changes new compared to Feb 2017 Reconfirmed by Don Giarrusso MD, [place] [phone]) on 10/22/2015 3:58:10 PM       Radiology Dg Chest 2 View  Result Date: 10/22/2015 CLINICAL DATA:  Lightheaded today, dizziness, weakness, history hypertension, stroke, diastolic CHF, coronary artery disease post stenting and CABG EXAM: CHEST  2 VIEW COMPARISON:  08/31/2015 FINDINGS: Upper normal heart size post CABG. Atherosclerotic calcification and tortuosity of thoracic aorta. Mediastinal contours and pulmonary vascularity  normal. Elevation of RIGHT diaphragm, chronic, with associated RIGHT basilar atelectasis. Remaining lungs clear. No pleural effusion or pneumothorax. RIGHT AC joint degenerative changes. IMPRESSION: Chronic  elevation of RIGHT diaphragm with RIGHT basilar atelectasis. Post CABG. No acute abnormalities. Electronically Signed   By: Lavonia Dana M.D.   On: 10/22/2015 14:02    Procedures Procedures (including critical care time)  Medications Ordered in ED Medications  aspirin chewable tablet 324 mg (not administered)  nitroGLYCERIN (NITROSTAT) SL tablet 0.4 mg (not administered)     Initial Impression / Assessment and Plan / ED Course  I have reviewed the triage vital signs and the nursing notes.  Pertinent labs & imaging results that were available during my care of the patient were reviewed by me and considered in my medical decision making (see chart for details).  Clinical Course  Comment By Time  Initial troponin is negative but with ECG changes and concerning history, I will consult cardiology and give ASA/nitro Sherwood Gambler, MD 08/07 1605  1st nitro had no effect on CP. Also is not worse. Will get repeat ECG, more nitro, and consult cards given I'm concerned his chest pain/weakness is anginal related. Sherwood Gambler, MD 08/07 1715  D/w [name], cards will come eval. Sherwood Gambler, MD 08/07 1724  Cards will admit Sherwood Gambler, MD 08/07 1803     Final Clinical Impressions(s) / ED Diagnoses   Final diagnoses:  Chest pain, cardiac    New Prescriptions New Prescriptions   No medications on file     Sherwood Gambler, MD 10/23/15 0003

## 2015-10-22 NOTE — ED Triage Notes (Signed)
Pt. Stated, I've got this thing feeling going on into my chest pain, and more weakness more than usual since I had my stroke.

## 2015-10-22 NOTE — H&P (Signed)
[name] is an 70 y.o. male.    Primary Cardiologist:Dr. Aundra Dubin  PCP:  [name]., NP  Chief Complaint: chest pain  HPI:  hx of CAD s/p CABG-2004, PAF, prior CVA.  Myoview in 2013 was neg for ischemia.  He has residual gait difficulty 2/2 to prior CVA.  Last seen by Dr. [name] in 3/16.    Admitted in 2/17 from AFib Clinic with AF with RVR. He had undergone DCCV earlier in 2/17 for AF with RVR.  HR was in 150s.  He was placed on Amiodarone gtt.  He underwent DCCV with return of NSR and was DC home on Amiodarone.  On visit 06/05/15 he was dizzy and bradycardic.  BB was reduced then stopped.     Echo 2/17 Mild LVH, EF 50-55%, no RWMA, normal diastolic function, mild LAE, mod TR  Carotid US 5/15 bilat ICA 1-39%  Myoview 8/13 Overall Impression: Normal stress nuclear study with a small, mild, fixed inferior defect consistent with inferior thinning but no ischemia.  LV Ejection Fraction: 62%. LV Wall Motion: NL LV Function; NL Wall Motion  LHC 1/11 LM dist 40% LAD ostial 40%, mid 60-70%, 40% LCx occluded OM2, LCx stent patent with 40% ISR, mid AV groove 90% RCA prox 30-40%, mid 30% L-LAD patent S-Dx/OM2 patent S-PDA occluded EF 60% PCI 3 x 18 mm Promus DES to mid LCx  Today he presents to ER with a feeling in his chest.   EKG with no acute changes.  Troponin is neg.  Pt with SOB yesterday and today chest pain. Pain has been off and on for 2 weks, but yesterday and last night increased mid sternal chest tightness, usually brief but last night more severe and some into shoulders no SOB, no nausea.  No NTG he was not sure what to do.  Came into today.  He is on warfarin.  He has continued chest pain 5-6/10 currently.       Recent botox in arm and leg for residual stroke symptoms.    Past Medical History:  Diagnosis Date  . CAD (coronary artery disease)    s/p CABG, s/p DES to LCX  January 2011  . Chronic pain    left sided-Kristeins    . CVA (cerebral infarction)    left hemiparesis  . Diastolic CHF ([place])   . Dysrhythmia    hx of atrial fibrilation with cardioversion  . GERD (gastroesophageal reflux disease)   . Gout   . History of colonoscopy   . Hyperlipidemia   . Hypertension   . Hypothyroidism   . OSA on CPAP   . Sleep apnea   . Stroke [facility])     Past Surgical History:  Procedure Laterality Date  . CARDIOVERSION  06/20/2011   Procedure: CARDIOVERSION;  Surgeon: Larey Dresser, MD;  Location: [facility] ENDOSCOPY;  Service: Cardiovascular;  Laterality: N/A;  . CARDIOVERSION N/A 04/26/2015   Procedure: CARDIOVERSION;  Surgeon: Dorothy Spark, MD;  Location: [facility] ENDOSCOPY;  Service: Cardiovascular;  Laterality: N/A;  . CARDIOVERSION N/A 05/10/2015   Procedure: CARDIOVERSION;  Surgeon: Larey Dresser, MD;  Location: [facility];  Service: Cardiovascular;  Laterality: N/A;  . CATARACT EXTRACTION  05/2010   left eye  . CATARACT EXTRACTION  04/2010   right eye  . CORONARY ARTERY BYPASS GRAFT     stent  . ESOPHAGOGASTRODUODENOSCOPY  03-25-2005  . NASAL SEPTUM SURGERY    . PERCUTANEOUS PLACEMENT INTRAVASCULAR STENT  CERVICAL CAROTID ARTERY     03-2009; using a drug-eluting platform of the circumflex cornoray artery with a 3.0 x 18 [employer] Promus drug-eluting platform post dilated to 3.75 with a noncompliant balloon.  . TEE WITHOUT CARDIOVERSION  06/20/2011   Procedure: TRANSESOPHAGEAL ECHOCARDIOGRAM (TEE);  Surgeon: Larey Dresser, MD;  Location: [facility] ENDOSCOPY;  Service: Cardiovascular;  Laterality: N/A;    Family History  Problem Relation Age of Onset  . Lung cancer Father     deceased  . Stroke Mother     deceased-MINISTROKES   Social History:  reports that he has quit smoking. He has never used smokeless tobacco. He reports that he does not drink alcohol or use drugs.  Allergies: No Known Allergies  OUTPATIENT MEDICATIONS: No current facility-administered medications on file prior to encounter.     Current Outpatient Prescriptions on File Prior to Encounter  Medication Sig Dispense Refill  . allopurinol (ZYLOPRIM) 100 MG tablet TAKE 2 TABLETS BY MOUTH DAILY. D/C PREVIOUS SCRIPTS FOR THIS MEDICATION 180 tablet 1  . amiodarone (PACERONE) 200 MG tablet Take 200 mg by mouth daily.    Marland Kitchen atorvastatin (LIPITOR) 40 MG tablet Take two tablets ( 80 mg total ) by mouth daily    . benazepril (LOTENSIN) 10 MG tablet Take 1 tablet (10 mg total) by mouth 2 (two) times daily. (Patient taking differently: Take 10 mg by mouth every morning. ) 60 tablet 11  . fluticasone (FLONASE) 50 MCG/ACT nasal spray Place 2 sprays into both nostrils daily. 9.9 g 1  . furosemide (LASIX) 40 MG tablet Take 1 tablet (40 mg total) by mouth every other day. 45 tablet 1  . levothyroxine (SYNTHROID, LEVOTHROID) 112 MCG tablet TAKE 1 TABLET (112 MCG TOTAL) BY MOUTH DAILY BEFORE BREAKFAST. 90 tablet 1  . metFORMIN (GLUCOPHAGE) 500 MG tablet TAKE 1 TABLET EVERY DAY WITH BREAKFAST (Patient taking differently: Take 500 mg by mouth 3 (three) times a week. TAKE 1 TABLET EVERY DAY WITH BREAKFAST) 90 tablet 1  . Multiple Vitamins-Minerals (MULTIVITAMIN GUMMIES MENS PO) Take 1 tablet by mouth daily.    . nitroGLYCERIN (NITROSTAT) 0.4 MG SL tablet Place 1 tablet (0.4 mg total) under the tongue every 5 (five) minutes as needed for chest pain. Up to 3 doses 10 tablet 2  . pantoprazole (PROTONIX) 40 MG tablet Take 1 tablet (40 mg total) by mouth daily. 90 tablet 1  . traMADol (ULTRAM) 50 MG tablet Take 1 tablet (50 mg total) by mouth 2 (two) times daily. 180 tablet 1  . traMADol (ULTRAM-ER) 200 MG 24 hr tablet TAKE 1 TABLET EVERY DAY WITH AM DOSE OF TRAMADOL 50 MG 90 tablet 1  . vitamin B-12 1000 MCG tablet Take 1 tablet (1,000 mcg total) by mouth daily. 30 tablet 0  . warfarin (COUMADIN) 2.5 MG tablet TAKE AS DIRECTED BY ANTICOAGULATION CLINIC (Patient taking differently: TAKES 2.5MG ON MON, WED AND FRI TAKES 1.25MG ALL OTHER DAYS) 100 tablet  0  . docusate sodium (COLACE) 100 MG capsule Take 1 capsule (100 mg total) by mouth 2 (two) times daily. (Patient not taking: Reported on 10/22/2015) 10 capsule 0  . polyethylene glycol powder (GLYCOLAX/MIRALAX) powder Take 17 g by mouth daily. Dissolve one capful of powder into any drink and take once daily. If no good effect after 2 days, take two times daily. If no good effect after 2 days, take three times daily. (Patient not taking: Reported on 10/22/2015) 500 g 0  . senna (SENOKOT) 8.6 MG  TABS tablet Take 1 tablet (8.6 mg total) by mouth daily. (Patient not taking: Reported on 10/22/2015) 120 each 0     Results for orders placed or performed during the hospital encounter of 10/22/15 (from the past 48 hour(s))  Basic metabolic panel     Status: Abnormal   Collection Time: 10/22/15  1:10 PM  Result Value Ref Range   Sodium 140 135 - 145 mmol/L   Potassium 4.0 3.5 - 5.1 mmol/L   Chloride 101 101 - 111 mmol/L   CO2 29 22 - 32 mmol/L   Glucose, Bld 108 (H) 65 - 99 mg/dL   BUN 17 6 - 20 mg/dL   Creatinine, Ser 1.12 0.61 - 1.24 mg/dL   Calcium 9.7 8.9 - 10.3 mg/dL   GFR calc non Af Amer >60 >60 mL/min   GFR calc Af Amer >60 >60 mL/min    Comment: (NOTE) The eGFR has been calculated using the CKD EPI equation. This calculation has not been validated in all clinical situations. eGFR's persistently <60 mL/min signify possible Chronic Kidney Disease.    Anion gap 10 5 - 15  CBC     Status: None   Collection Time: 10/22/15  1:10 PM  Result Value Ref Range   WBC 5.6 4.0 - 10.5 K/uL   RBC 4.82 4.22 - 5.81 MIL/uL   Hemoglobin 14.2 13.0 - 17.0 g/dL   HCT 45.3 39.0 - 52.0 %   MCV 94.0 78.0 - 100.0 fL   MCH 29.5 26.0 - 34.0 pg   MCHC 31.3 30.0 - 36.0 g/dL   RDW 13.5 11.5 - 15.5 %   Platelets 184 150 - 400 K/uL  I-stat troponin, ED     Status: None   Collection Time: 10/22/15  1:18 PM  Result Value Ref Range   Troponin i, poc 0.01 0.00 - 0.08 ng/mL   Comment 3            Comment: Due to the  release kinetics of cTnI, a negative result within the first hours of the onset of symptoms does not rule out myocardial infarction with certainty. If myocardial infarction is still suspected, repeat the test at appropriate intervals.    Dg Chest 2 View  Result Date: 10/22/2015 CLINICAL DATA:  Lightheaded today, dizziness, weakness, history hypertension, stroke, diastolic CHF, coronary artery disease post stenting and CABG EXAM: CHEST  2 VIEW COMPARISON:  08/31/2015 FINDINGS: Upper normal heart size post CABG. Atherosclerotic calcification and tortuosity of thoracic aorta. Mediastinal contours and pulmonary vascularity normal. Elevation of RIGHT diaphragm, chronic, with associated RIGHT basilar atelectasis. Remaining lungs clear. No pleural effusion or pneumothorax. RIGHT AC joint degenerative changes. IMPRESSION: Chronic elevation of RIGHT diaphragm with RIGHT basilar atelectasis. Post CABG. No acute abnormalities. Electronically Signed   By: Lavonia Dana M.D.   On: 10/22/2015 14:02    ROS: General:no colds or fevers, no weight changes Skin:no rashes or ulcers HEENT:no blurred vision, no congestion CV:see HPI PUL:see HPI GI:no diarrhea constipation or melena, no indigestion GU:no hematuria, no dysuria MS:no joint pain, no claudication Neuro:no syncope, no lightheadedness Endo:+ diabetes- has been taking 3 X week, + thyroid disease   Blood pressure 126/73, pulse (!) 57, temperature 98.2 F (36.8 C), temperature source Oral, resp. rate 11, height 6' 3"  (1.905 m), weight 255 lb (115.7 kg), SpO2 98 %. 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   Active Problems:   Hyperlipidemia- continue statin last checked in 12/2014 and stable will check in am   Essential hypertension- elevated initially but improved now.     Hx of CABG '04- lst cath 2011, last nuc 2013.    History of stroke with residual deficit- recent injection of botox for Lt side residual.    Diabetes type 2, controlled ([place]) SSI and hold metformin    PAF - CAHDs VASc=7 hold coumadin and begin heparin per pharmacy if INR < 2.0     Cecilie Kicks Nurse Practitioner Certified [place] Pager [phone] or after 5pm or weekends call [phone] 10/22/2015, 6:10 PM   Agree with note written by Cecilie Kicks RNP  Pt of Dr Claris Gladden with H/O remote CVA, CAD s/p remote CABG 2003 and Cath 2911 with PDI/DES . H/O PAF maintaining NSR on coumadin with a therapeutic INR. He has been experiencing SSCP for the past 2 weeks off and on, worse last night. He continues to C/O 5/10 CP although he doesn't appear that uncomfortable. Exam benign. EKG w/o acute changes. Initial enz neg. WIll admit to stepdown, start IV NTG. Hold coumadin in the event that he requires cath and start IV hep once INR <2. Will cycle enz and plan Lexiscan in Am. If enz turn + he will need cor angio.  Quay Burow 10/22/2015 7:09 PM

## 2015-10-23 ENCOUNTER — Inpatient Hospital Stay (HOSPITAL_COMMUNITY): Payer: Medicare Other

## 2015-10-23 DIAGNOSIS — I480 Paroxysmal atrial fibrillation: Secondary | ICD-10-CM | POA: Diagnosis not present

## 2015-10-23 DIAGNOSIS — E785 Hyperlipidemia, unspecified: Secondary | ICD-10-CM | POA: Diagnosis not present

## 2015-10-23 DIAGNOSIS — I110 Hypertensive heart disease with heart failure: Secondary | ICD-10-CM | POA: Diagnosis not present

## 2015-10-23 DIAGNOSIS — I208 Other forms of angina pectoris: Secondary | ICD-10-CM | POA: Diagnosis not present

## 2015-10-23 DIAGNOSIS — I69354 Hemiplegia and hemiparesis following cerebral infarction affecting left non-dominant side: Secondary | ICD-10-CM | POA: Diagnosis not present

## 2015-10-23 DIAGNOSIS — I5032 Chronic diastolic (congestive) heart failure: Secondary | ICD-10-CM | POA: Diagnosis not present

## 2015-10-23 DIAGNOSIS — E039 Hypothyroidism, unspecified: Secondary | ICD-10-CM | POA: Diagnosis not present

## 2015-10-23 DIAGNOSIS — Z955 Presence of coronary angioplasty implant and graft: Secondary | ICD-10-CM | POA: Diagnosis not present

## 2015-10-23 DIAGNOSIS — I25110 Atherosclerotic heart disease of native coronary artery with unstable angina pectoris: Secondary | ICD-10-CM | POA: Diagnosis not present

## 2015-10-23 DIAGNOSIS — M109 Gout, unspecified: Secondary | ICD-10-CM | POA: Diagnosis not present

## 2015-10-23 DIAGNOSIS — Z951 Presence of aortocoronary bypass graft: Secondary | ICD-10-CM | POA: Diagnosis not present

## 2015-10-23 DIAGNOSIS — E119 Type 2 diabetes mellitus without complications: Secondary | ICD-10-CM | POA: Diagnosis not present

## 2015-10-23 DIAGNOSIS — K219 Gastro-esophageal reflux disease without esophagitis: Secondary | ICD-10-CM | POA: Diagnosis not present

## 2015-10-23 LAB — NM MYOCAR MULTI W/SPECT W/WALL MOTION / EF
CHL CUP NUCLEAR SDS: 9
CHL CUP NUCLEAR SRS: 7
CHL CUP NUCLEAR SSS: 14
CHL CUP RESTING HR STRESS: 61 {beats}/min
CHL CUP STRESS STAGE 1 GRADE: 0 %
CHL CUP STRESS STAGE 1 SPEED: 0 mph
CHL CUP STRESS STAGE 2 SPEED: 0 mph
CHL CUP STRESS STAGE 3 DBP: 68 mmHg
CHL CUP STRESS STAGE 3 SPEED: 0 mph
CHL CUP STRESS STAGE 4 SPEED: 0 mph
Exercise duration (min): 5 min
Exercise duration (sec): 2 s
LHR: 0.39
LV dias vol: 80 mL (ref 62–150)
LV sys vol: 41 mL
Stage 1 HR: 61 {beats}/min
Stage 2 Grade: 0 %
Stage 2 HR: 61 {beats}/min
Stage 3 Grade: 0 %
Stage 3 HR: 72 {beats}/min
Stage 3 SBP: 131 mmHg
Stage 4 DBP: 71 mmHg
Stage 4 Grade: 0 %
Stage 4 HR: 70 {beats}/min
Stage 4 SBP: 136 mmHg
TID: 1.04

## 2015-10-23 LAB — HEMOGLOBIN A1C
Hgb A1c MFr Bld: 5.8 % — ABNORMAL HIGH (ref 4.8–5.6)
Mean Plasma Glucose: 120 mg/dL

## 2015-10-23 LAB — BASIC METABOLIC PANEL
ANION GAP: 8 (ref 5–15)
BUN: 18 mg/dL (ref 6–20)
CHLORIDE: 102 mmol/L (ref 101–111)
CO2: 31 mmol/L (ref 22–32)
Calcium: 9.1 mg/dL (ref 8.9–10.3)
Creatinine, Ser: 1.08 mg/dL (ref 0.61–1.24)
GFR calc Af Amer: >60 mL/min (ref >60)
GLUCOSE: 89 mg/dL (ref 65–99)
POTASSIUM: 3.7 mmol/L (ref 3.5–5.1)
Sodium: 141 mmol/L (ref 135–145)

## 2015-10-23 LAB — CBC
HCT: 39.8 % (ref 39.0–52.0)
Hemoglobin: 12.3 g/dL — ABNORMAL LOW (ref 13.0–17.0)
MCH: 29 pg (ref 26.0–34.0)
MCHC: 30.9 g/dL (ref 30.0–36.0)
MCV: 93.9 fL (ref 78.0–100.0)
PLATELETS: 141 10*3/uL — AB (ref 150–400)
RBC: 4.24 MIL/uL (ref 4.22–5.81)
RDW: 13.6 % (ref 11.5–15.5)
WBC: 5.4 10*3/uL (ref 4.0–10.5)

## 2015-10-23 LAB — HEPATIC FUNCTION PANEL
ALK PHOS: 45 U/L (ref 38–126)
ALT: 58 U/L (ref 17–63)
AST: 38 U/L (ref 15–41)
Albumin: 3.2 g/dL — ABNORMAL LOW (ref 3.5–5.0)
BILIRUBIN INDIRECT: 1 mg/dL — AB (ref 0.3–0.9)
Bilirubin, Direct: 0.1 mg/dL (ref 0.1–0.5)
Total Bilirubin: 1.1 mg/dL (ref 0.3–1.2)
Total Protein: 5.9 g/dL — ABNORMAL LOW (ref 6.5–8.1)

## 2015-10-23 LAB — LIPID PANEL
CHOL/HDL RATIO: 3.4 ratio
CHOLESTEROL: 142 mg/dL (ref 0–200)
HDL: 42 mg/dL (ref >40)
LDL Cholesterol: 75 mg/dL (ref 0–99)
Triglycerides: 127 mg/dL (ref <150)
VLDL: 25 mg/dL (ref 0–40)

## 2015-10-23 LAB — TROPONIN I

## 2015-10-23 LAB — PROTIME-INR
INR: 2.81
Prothrombin Time: 30.1 seconds — ABNORMAL HIGH (ref 11.4–15.2)

## 2015-10-23 LAB — GLUCOSE, CAPILLARY: GLUCOSE-CAPILLARY: 89 mg/dL (ref 65–99)

## 2015-10-23 MED ORDER — REGADENOSON 0.4 MG/5ML IV SOLN
INTRAVENOUS | Status: AC
  Administered 2015-10-23: 0.4 mg via INTRAVENOUS
  Filled 2015-10-23: qty 5

## 2015-10-23 MED ORDER — PANTOPRAZOLE SODIUM 40 MG PO TBEC
40.0000 mg | DELAYED_RELEASE_TABLET | Freq: Once | ORAL | Status: AC
  Administered 2015-10-23: 40 mg via ORAL
  Filled 2015-10-23: qty 1

## 2015-10-23 MED ORDER — REGADENOSON 0.4 MG/5ML IV SOLN
0.4000 mg | Freq: Once | INTRAVENOUS | Status: AC
  Administered 2015-10-23: 0.4 mg via INTRAVENOUS
  Filled 2015-10-23: qty 5

## 2015-10-23 MED ORDER — TECHNETIUM TC 99M TETROFOSMIN IV KIT
10.0000 | PACK | Freq: Once | INTRAVENOUS | Status: AC | PRN
  Administered 2015-10-23: 10 via INTRAVENOUS

## 2015-10-23 MED ORDER — NITROGLYCERIN IN D5W 200-5 MCG/ML-% IV SOLN: 0.0000 ug/min | INTRAVENOUS | Status: DC

## 2015-10-23 MED ORDER — TECHNETIUM TC 99M TETROFOSMIN IV KIT
30.0000 | PACK | Freq: Once | INTRAVENOUS | Status: AC | PRN
  Administered 2015-10-23: 30 via INTRAVENOUS

## 2015-10-23 NOTE — Progress Notes (Signed)
Nitro drip restarted at 5 mics, for continuing CP post stress test.  1/10 pain. Verbal per cards.

## 2015-10-23 NOTE — Progress Notes (Signed)
Patient Name: [name] Date of Encounter: 10/23/2015   SUBJECTIVE  Off IV nitro early morning as patient was chest pain free. Seen in nuc med --> Complains of chest pressure which was mild. No SOB.   CURRENT MEDS . allopurinol  100 mg Oral BID  . amiodarone  200 mg Oral Daily  . aspirin EC  81 mg Oral Daily  . atorvastatin  40 mg Oral q1800  . benazepril  10 mg Oral Daily  . fluticasone  2 spray Each Nare Daily  . furosemide  40 mg Oral QODAY  . insulin aspart  0-9 Units Subcutaneous TID WC  . levothyroxine  112 mcg Oral QAC breakfast  . multivitamin with minerals  1 tablet Oral Daily  . pantoprazole  40 mg Oral Daily  . traMADol  50 mg Oral Q6H  . cyanocobalamin  1,000 mcg Oral Daily    OBJECTIVE  Vitals:   10/23/15 1132 10/23/15 1134 10/23/15 1136 10/23/15 1137  BP: 134/69 131/68 136/71   Pulse: 66 74 73 70  Resp:      Temp:      TempSrc:      SpO2:      Weight:      Height:        Intake/Output Summary (Last 24 hours) at 10/23/15 1148 Last data filed at 10/23/15 [id number]  Gross per 24 hour  Intake           387.75 ml  Output                0 ml  Net           387.75 ml   Filed Weights   10/22/15 1304 10/22/15 2024 10/23/15 0609  Weight: 255 lb (115.7 kg) 248 lb (112.5 kg) 250 lb (113.4 kg)    PHYSICAL EXAM  General: Pleasant, NAD. Neuro: Alert and oriented X 3. Moves all extremities spontaneously. Psych: Normal affect. HEENT:  Normal  Neck: Supple without bruits or JVD. Lungs:  Resp regular and unlabored, CTA. Heart: RRR no s3, s4, or murmurs. Abdomen: Soft, non-tender, non-distended, BS + x 4.  Extremities: No clubbing, cyanosis or edema. DP/PT/Radials 2+ and equal bilaterally.  Accessory Clinical Findings  CBC  Recent Labs  10/22/15 1310 10/23/15 0519  WBC 5.6 5.4  HGB 14.2 12.3*  HCT 45.3 39.8  MCV 94.0 93.9  PLT 184 Q000111Q*   Basic Metabolic Panel  Recent Labs  10/22/15 1310 10/22/15 1839 10/23/15 0519  NA 140  --  141  K  4.0  --  3.7  CL 101  --  102  CO2 29  --  31  GLUCOSE 108*  --  89  BUN 17  --  18  CREATININE 1.12  --  1.08  CALCIUM 9.7  --  9.1  MG  --  2.1  --    Liver Function Tests  Recent Labs  10/23/15 0519  AST 38  ALT 58  ALKPHOS 45  BILITOT 1.1  PROT 5.9*  ALBUMIN 3.2*   No results for input(s): LIPASE, AMYLASE in the last 72 hours. Cardiac Enzymes  Recent Labs  10/22/15 1839 10/22/15 2345 10/23/15 0519  TROPONINI <0.03 <0.03 <0.03   BNP Invalid input(s): POCBNP D-Dimer No results for input(s): DDIMER in the last 72 hours. Hemoglobin A1C  Recent Labs  10/22/15 1839  HGBA1C 5.8*   Fasting Lipid Panel  Recent Labs  10/23/15 0519  CHOL 142  HDL 42  LDLCALC 75  TRIG 127  CHOLHDL 3.4   Thyroid Function Tests  Recent Labs  10/22/15 1839  TSH 0.768    TELE  Unable to review as patient seen in nuc med  Radiology/Studies  Dg Chest 2 View  Result Date: 10/22/2015 CLINICAL DATA:  Lightheaded today, dizziness, weakness, history hypertension, stroke, diastolic CHF, coronary artery disease post stenting and CABG EXAM: CHEST  2 VIEW COMPARISON:  08/31/2015 FINDINGS: Upper normal heart size post CABG. Atherosclerotic calcification and tortuosity of thoracic aorta. Mediastinal contours and pulmonary vascularity normal. Elevation of RIGHT diaphragm, chronic, with associated RIGHT basilar atelectasis. Remaining lungs clear. No pleural effusion or pneumothorax. RIGHT AC joint degenerative changes. IMPRESSION: Chronic elevation of RIGHT diaphragm with RIGHT basilar atelectasis. Post CABG. No acute abnormalities. Electronically Signed   By: Lavonia Dana M.D.   On: 10/22/2015 14:02    ASSESSMENT AND PLAN  1.  Angina at rest [facility]) - He was chest pain free on IV nitro. Will resume,  if reoccurs. Troponin x 3 negative. Pending nuc result.   2. PAF - CAHDs VASc=7. Maintaining sinus rhythm. Coumadin on hold for possible cath. Start IV heparin per pharmacy once INR less  than 2.   3. Diabetes type 2, controlled (HCC) - A1C 5.8 during admission. He does not like blood sugar check often and states "I don't have diabetes" per nurse. Will discontinue SSI. Continue to hold metformin and resume at discharge.   4. HLD - 10/23/2015: Cholesterol 142; HDL 42; LDL Cholesterol 75; Triglycerides 127; VLDL 25  - Continue statin  5.  Essential hypertension - Stable. Continue current regimen.      Hx of CABG '04   History of stroke with residual deficit   Signed, Firefighter Pager [phone]

## 2015-10-23 NOTE — Plan of Care (Signed)
Problem: Education: Goal: Knowledge of [facility] Education information/materials will improve Outcome: Progressing Pt educated on atrial fib, stroke prevention, and nitro drip. Handouts given to pt. Pt has no further questions at this time. Informed they are NPO after MN for possible cath in am if INR <2.

## 2015-10-23 NOTE — Care Management CC44 (Signed)
Condition Code 44 Documentation Completed  Patient Details  Name: Reno Stickels MRN: [id number] Date of Birth: 03-07-1946   Condition Code 44 given:  Yes Patient signature on Condition Code 44 notice:  Yes Documentation of 2 MD's agreement:  Yes Code 44 added to claim:  Yes    Bethena Roys, RN 10/23/2015, 3:35 PM

## 2015-10-23 NOTE — Progress Notes (Signed)
Stress test was Intermediate stress nuclear study with small prior apical infarct vs thinning with mild apical ischemia and mild to moderate ischemia in the inferolateral wall; EF 49 with mild global hypokinesis.  Will need cath. INR of 2.81 today. Will tentatively place for cath tomorrow, NPO after midnight. Start heparin when INR less than 2. Probably won't be ready for cath until Thursday.   Please write cath order when ready.

## 2015-10-23 NOTE — Progress Notes (Signed)
[place] for heparin Indication: chest pain/ACS  No Known Allergies  Patient Measurements: Height: 6\' 3"  (190.5 cm) Weight: 250 lb (113.4 kg) IBW/kg (Calculated) : 84.5 Heparin Dosing Weight: 104.6 kg  Vital Signs: Temp: 97.6 F (36.4 C) (08/08 0800) Temp Source: Oral (08/08 0800) BP: 137/70 (08/08 1350) Pulse Rate: 66 (08/08 1350)  Labs:  Recent Labs  10/22/15 1310 10/22/15 1839 10/22/15 2345 10/23/15 0519  HGB 14.2  --   --  12.3*  HCT 45.3  --   --  39.8  PLT 184  --   --  141*  LABPROT  --  28.2*  --  30.1*  INR  --  2.58  --  2.81  CREATININE 1.12  --   --  1.08  TROPONINI  --  <0.03 <0.03 <0.03    Estimated Creatinine Clearance: 84 mL/min (by C-G formula based on SCr of 1.08 mg/dL).   Medical History: Past Medical History:  Diagnosis Date  . CAD (coronary artery disease)    s/p CABG, s/p DES to LCX  January 2011  . Chronic pain    left sided-Kristeins  . CVA (cerebral infarction)    left hemiparesis  . Diastolic CHF ([place])   . Dysrhythmia    hx of atrial fibrilation with cardioversion  . GERD (gastroesophageal reflux disease)   . Gout   . Hyperlipidemia   . Hypertension   . Hypothyroidism   . OSA on CPAP   . Sleep apnea   . Stroke ([place])   . Tubular adenoma of colon 05/2010    Assessment: 59 YOM presenting for chest pain. INR up today to 2.81. On warfarin PTA for A-fib and now held inpatient. Plan for cath when INR < 2. Last dose of warfarin on 8/7. CBC stable.   Goal of Therapy:  Heparin level 0.3-0.7 units/ml Monitor platelets by anticoagulation protocol: Yes   Plan:   Start heparin when INR <2 Check daily INR    Thank you for allowing [place] to participate in this patients care. Jens Som, PharmD Pager: [phone] 10/23/2015 3:29 PM

## 2015-10-23 NOTE — Care Management Obs Status (Signed)
[place] NOTIFICATION   Patient Details  Name: [name] MRN: [id number] Date of Birth: [birth year]   Medicare Observation Status Notification Given:  Yes    Bethena Roys, RN 10/23/2015, 3:35 PM

## 2015-10-24 DIAGNOSIS — Z7901 Long term (current) use of anticoagulants: Secondary | ICD-10-CM | POA: Diagnosis not present

## 2015-10-24 DIAGNOSIS — I208 Other forms of angina pectoris: Secondary | ICD-10-CM | POA: Diagnosis not present

## 2015-10-24 DIAGNOSIS — E119 Type 2 diabetes mellitus without complications: Secondary | ICD-10-CM | POA: Diagnosis present

## 2015-10-24 DIAGNOSIS — Z951 Presence of aortocoronary bypass graft: Secondary | ICD-10-CM | POA: Diagnosis not present

## 2015-10-24 DIAGNOSIS — I5032 Chronic diastolic (congestive) heart failure: Secondary | ICD-10-CM | POA: Diagnosis present

## 2015-10-24 DIAGNOSIS — I10 Essential (primary) hypertension: Secondary | ICD-10-CM | POA: Diagnosis not present

## 2015-10-24 DIAGNOSIS — Z87891 Personal history of nicotine dependence: Secondary | ICD-10-CM | POA: Diagnosis not present

## 2015-10-24 DIAGNOSIS — I25110 Atherosclerotic heart disease of native coronary artery with unstable angina pectoris: Secondary | ICD-10-CM | POA: Diagnosis not present

## 2015-10-24 DIAGNOSIS — G4733 Obstructive sleep apnea (adult) (pediatric): Secondary | ICD-10-CM | POA: Diagnosis present

## 2015-10-24 DIAGNOSIS — Z9841 Cataract extraction status, right eye: Secondary | ICD-10-CM | POA: Diagnosis not present

## 2015-10-24 DIAGNOSIS — K219 Gastro-esophageal reflux disease without esophagitis: Secondary | ICD-10-CM | POA: Diagnosis present

## 2015-10-24 DIAGNOSIS — E785 Hyperlipidemia, unspecified: Secondary | ICD-10-CM | POA: Diagnosis present

## 2015-10-24 DIAGNOSIS — Z9842 Cataract extraction status, left eye: Secondary | ICD-10-CM | POA: Diagnosis not present

## 2015-10-24 DIAGNOSIS — Z7984 Long term (current) use of oral hypoglycemic drugs: Secondary | ICD-10-CM | POA: Diagnosis not present

## 2015-10-24 DIAGNOSIS — Z955 Presence of coronary angioplasty implant and graft: Secondary | ICD-10-CM | POA: Diagnosis not present

## 2015-10-24 DIAGNOSIS — M109 Gout, unspecified: Secondary | ICD-10-CM | POA: Diagnosis present

## 2015-10-24 DIAGNOSIS — I69354 Hemiplegia and hemiparesis following cerebral infarction affecting left non-dominant side: Secondary | ICD-10-CM | POA: Diagnosis not present

## 2015-10-24 DIAGNOSIS — I110 Hypertensive heart disease with heart failure: Secondary | ICD-10-CM | POA: Diagnosis present

## 2015-10-24 DIAGNOSIS — Z7951 Long term (current) use of inhaled steroids: Secondary | ICD-10-CM | POA: Diagnosis not present

## 2015-10-24 DIAGNOSIS — R931 Abnormal findings on diagnostic imaging of heart and coronary circulation: Secondary | ICD-10-CM

## 2015-10-24 DIAGNOSIS — E039 Hypothyroidism, unspecified: Secondary | ICD-10-CM | POA: Diagnosis present

## 2015-10-24 DIAGNOSIS — I480 Paroxysmal atrial fibrillation: Secondary | ICD-10-CM | POA: Diagnosis not present

## 2015-10-24 LAB — PROTIME-INR
INR: 2.43
PROTHROMBIN TIME: 26.9 s — AB (ref 11.4–15.2)

## 2015-10-24 LAB — GLUCOSE, CAPILLARY: Glucose-Capillary: 102 mg/dL — ABNORMAL HIGH (ref 65–99)

## 2015-10-24 MED ORDER — POLYETHYLENE GLYCOL 3350 17 G PO PACK
17.0000 g | PACK | Freq: Every day | ORAL | Status: DC
  Administered 2015-10-24: 17 g via ORAL
  Filled 2015-10-24: qty 1

## 2015-10-24 NOTE — Progress Notes (Signed)
[place] for heparin when INR < 2 Indication: chest pain/ACS  No Known Allergies  Patient Measurements: Height: 6\' 3"  (190.5 cm) Weight: 249 lb 6.4 oz (113.1 kg) IBW/kg (Calculated) : 84.5 Heparin Dosing Weight: 104.6 kg  Vital Signs: Temp: 97.7 F (36.5 C) (08/09 1153) Temp Source: Oral (08/09 1153) BP: 144/80 (08/09 0443) Pulse Rate: 55 (08/09 0443)  Labs:  Recent Labs  10/22/15 1310 10/22/15 1839 10/22/15 2345 10/23/15 0519 10/24/15 0417  HGB 14.2  --   --  12.3*  --   HCT 45.3  --   --  39.8  --   PLT 184  --   --  141*  --   LABPROT  --  28.2*  --  30.1* 26.9*  INR  --  2.58  --  2.81 2.43  CREATININE 1.12  --   --  1.08  --   TROPONINI  --  <0.03 <0.03 <0.03  --     Estimated Creatinine Clearance: 83.9 mL/min (by C-G formula based on SCr of 1.08 mg/dL).  Assessment: [name] presenting for chest pain. INR 2.43 today. On warfarin PTA for A-fib and now held inpatient. Plan for cath when INR < 2. Last dose of warfarin on 8/7. CBC stable.   Goal of Therapy:  Heparin level 0.3-0.7 units/ml Monitor platelets by anticoagulation protocol: Yes   Plan:   Start heparin when INR <2 For cath when INR < 2 Check daily INR   Eudelia Bunch, Pharm.D. [id number] 10/24/2015 2:31 PM

## 2015-10-24 NOTE — Research (Signed)
LEADERS Free Informed Consent   Subject Name: [name]  Subject met inclusion and exclusion criteria.  The informed consent form, study requirements and expectations were reviewed with the subject and questions and concerns were addressed prior to the signing of the consent form.  The subject verbalized understanding of the trail requirements.  The subject agreed to participate in the [place] trial and signed the informed consent.  The informed consent was obtained prior to performance of any protocol-specific procedures for the subject.  A copy of the signed informed consent was given to the subject and a copy was placed in the subject's medical record.  Sandie Ano 10/24/2015, 14:30

## 2015-10-24 NOTE — Progress Notes (Signed)
Patient Name: [name] Date of Encounter: 10/24/2015     Principal Problem:   Angina at rest [facility]) Active Problems:   Hyperlipidemia   Essential hypertension   Hx of CABG '04   History of stroke with residual deficit   Diabetes type 2, controlled ([place])   PAF - CAHDs VASc=7    SUBJECTIVE  Irritated cath isnt today but understands. No chest pain currently. Plan for cath tomorrow  CURRENT MEDS . allopurinol  100 mg Oral BID  . amiodarone  200 mg Oral Daily  . aspirin EC  81 mg Oral Daily  . atorvastatin  40 mg Oral q1800  . benazepril  10 mg Oral Daily  . fluticasone  2 spray Each Nare Daily  . furosemide  40 mg Oral QODAY  . levothyroxine  112 mcg Oral QAC breakfast  . multivitamin with minerals  1 tablet Oral Daily  . pantoprazole  40 mg Oral Daily  . traMADol  50 mg Oral Q6H  . cyanocobalamin  1,000 mcg Oral Daily    OBJECTIVE  Vitals:   10/24/15 0000 10/24/15 0443 10/24/15 0500 10/24/15 0744  BP: (!) 144/71 (!) 144/80    Pulse: 63 (!) 55    Resp: 15 14    Temp: 98.4 F (36.9 C) 98 F (36.7 C)  98 F (36.7 C)  TempSrc: Oral Oral  Oral  SpO2: 98% 98%  99%  Weight:   249 lb 6.4 oz (113.1 kg)   Height:        Intake/Output Summary (Last 24 hours) at 10/24/15 0812 Last data filed at 10/23/15 2000  Gross per 24 hour  Intake           490.37 ml  Output                0 ml  Net           490.37 ml   Filed Weights   10/22/15 2024 10/23/15 0609 10/24/15 0500  Weight: 248 lb (112.5 kg) 250 lb (113.4 kg) 249 lb 6.4 oz (113.1 kg)    PHYSICAL EXAM  General: Pleasant, NAD.  Neuro: Alert and oriented X 3. Moves all extremities spontaneously. Psych: Normal affect. HEENT:  Normal  Neck: Supple without bruits or JVD. Lungs:  Resp regular and unlabored, CTA. Heart: RRR no s3, s4, or murmurs. Abdomen: Soft, non-tender, non-distended, BS + x 4.  Extremities: No clubbing, cyanosis or edema. DP/PT/Radials 2+ and equal bilaterally.  Accessory Clinical  Findings  CBC  Recent Labs  10/22/15 1310 10/23/15 0519  WBC 5.6 5.4  HGB 14.2 12.3*  HCT 45.3 39.8  MCV 94.0 93.9  PLT 184 Q000111Q*   Basic Metabolic Panel  Recent Labs  10/22/15 1310 10/22/15 1839 10/23/15 0519  NA 140  --  141  K 4.0  --  3.7  CL 101  --  102  CO2 29  --  31  GLUCOSE 108*  --  89  BUN 17  --  18  CREATININE 1.12  --  1.08  CALCIUM 9.7  --  9.1  MG  --  2.1  --    Liver Function Tests  Recent Labs  10/23/15 0519  AST 38  ALT 58  ALKPHOS 45  BILITOT 1.1  PROT 5.9*  ALBUMIN 3.2*   No results for input(s): LIPASE, AMYLASE in the last 72 hours. Cardiac Enzymes  Recent Labs  10/22/15 1839 10/22/15 2345 10/23/15 0519  TROPONINI <0.03 <0.03 <0.03  BNP Invalid input(s): POCBNP D-Dimer No results for input(s): DDIMER in the last 72 hours. Hemoglobin A1C  Recent Labs  10/22/15 1839  HGBA1C 5.8*   Fasting Lipid Panel  Recent Labs  10/23/15 0519  CHOL 142  HDL 42  LDLCALC 75  TRIG 127  CHOLHDL 3.4   Thyroid Function Tests  Recent Labs  10/22/15 1839  TSH 0.768    TELE  NSR   Radiology/Studies  Dg Chest 2 View  Result Date: 10/22/2015 CLINICAL DATA:  Lightheaded today, dizziness, weakness, history hypertension, stroke, diastolic CHF, coronary artery disease post stenting and CABG EXAM: CHEST  2 VIEW COMPARISON:  08/31/2015 FINDINGS: Upper normal heart size post CABG. Atherosclerotic calcification and tortuosity of thoracic aorta. Mediastinal contours and pulmonary vascularity normal. Elevation of RIGHT diaphragm, chronic, with associated RIGHT basilar atelectasis. Remaining lungs clear. No pleural effusion or pneumothorax. RIGHT AC joint degenerative changes. IMPRESSION: Chronic elevation of RIGHT diaphragm with RIGHT basilar atelectasis. Post CABG. No acute abnormalities. Electronically Signed   By: Lavonia Dana M.D.   On: 10/22/2015 14:02   Nm Myocar Multi W/spect W/wall Motion / Ef  Result Date: 10/23/2015  There was  no ST segment deviation noted during stress.  Defect 1: There is a medium defect of moderate severity present in the basal inferolateral and mid inferolateral location.  Defect 2: There is a small defect of moderate severity present in the apex location.  This is an intermediate risk study.  The left ventricular ejection fraction is mildly decreased (45-54%).  Intermediate stress nuclear study with small prior apical infarct vs thinning with mild apical ischemia and mild to moderate ischemia in the inferolateral wall; EF 49 with mild global hypokinesis.    ASSESSMENT AND PLAN  Chest pain: he ruled out for MI and underwent nuclear stress test yesterday which showed a small prior apical infarct versus thinning with mild apical ischemia and mild to moderate ischemia in the inferolateral wall; EF 49% with global hypokinesis. Plan will be for cath when his INR is below 2. INR 2.43 today.   CAD s/p CABG (2004): continue statin. No ASA due to coumadin use and no BB due to bradycardia and dizziness   PAF: maintaining NSR on amiodarone. On Coumadin for CHADSVASC score of 7. Holding for cath. INR 2.43 today.  HTN: BP with moderate control  DMT2: A1C 5.8 during admission. Hold Metformin for cath  HLD: LDL 75 on 10/23/15. Continue statin   Hx of CVA: with residual gait difficulty    Signed, Angelena Form PA-C  Pager [phone] Agree with note by Nell Range PA-C  No further CP. Maintaining NSR. Intermediate risk MV (I personally reviewed). INR 2.4 (down from 2.8), Coumadin on hold. Exam benign. For cath tomorrow if INR <2.0   Lorretta Harp, M.D., [place], [facility], [place] [address] [place], [place]  [address]  [phone] 10/24/2015 9:55 AM

## 2015-10-25 ENCOUNTER — Encounter (HOSPITAL_COMMUNITY)
Admission: EM | Disposition: A | Discharge status: Home / Self Care | Payer: Self-pay | Source: Home / Self Care | Attending: Cardiovascular Disease

## 2015-10-25 DIAGNOSIS — I25110 Atherosclerotic heart disease of native coronary artery with unstable angina pectoris: Principal | ICD-10-CM

## 2015-10-25 DIAGNOSIS — E785 Hyperlipidemia, unspecified: Secondary | ICD-10-CM

## 2015-10-25 HISTORY — PX: CARDIAC CATHETERIZATION: SHX172

## 2015-10-25 LAB — PROTIME-INR
INR: 1.99
Prothrombin Time: 22.9 seconds — ABNORMAL HIGH (ref 11.4–15.2)

## 2015-10-25 LAB — GLUCOSE, CAPILLARY: GLUCOSE-CAPILLARY: 103 mg/dL — AB (ref 65–99)

## 2015-10-25 SURGERY — LEFT HEART CATH AND CORS/GRAFTS ANGIOGRAPHY

## 2015-10-25 MED ORDER — IOPAMIDOL (ISOVUE-370) INJECTION 76%
INTRAVENOUS | Status: AC
  Filled 2015-10-25: qty 125

## 2015-10-25 MED ORDER — MIDAZOLAM HCL 2 MG/2ML IJ SOLN
INTRAMUSCULAR | Status: AC
  Filled 2015-10-25: qty 2

## 2015-10-25 MED ORDER — MIDAZOLAM HCL 2 MG/2ML IJ SOLN
INTRAMUSCULAR | Status: DC | PRN
  Administered 2015-10-25: 2 mg via INTRAVENOUS

## 2015-10-25 MED ORDER — ASPIRIN EC 81 MG PO TBEC
81.0000 mg | DELAYED_RELEASE_TABLET | Freq: Every day | ORAL | Status: DC
  Administered 2015-10-26: 81 mg via ORAL
  Filled 2015-10-25: qty 1

## 2015-10-25 MED ORDER — VERAPAMIL HCL 2.5 MG/ML IV SOLN
INTRA_ARTERIAL | Status: DC | PRN
  Administered 2015-10-25 (×2): 10 mL via INTRA_ARTERIAL

## 2015-10-25 MED ORDER — NITROGLYCERIN 1 MG/10 ML FOR IR/CATH LAB
INTRA_ARTERIAL | Status: DC | PRN
  Administered 2015-10-25: 200 ug via INTRA_ARTERIAL

## 2015-10-25 MED ORDER — IOPAMIDOL (ISOVUE-370) INJECTION 76%
INTRAVENOUS | Status: DC | PRN
  Administered 2015-10-25: 180 mL via INTRA_ARTERIAL

## 2015-10-25 MED ORDER — HEPARIN (PORCINE) IN NACL 2-0.9 UNIT/ML-% IJ SOLN
INTRAMUSCULAR | Status: DC | PRN
  Administered 2015-10-25: 1000 mL

## 2015-10-25 MED ORDER — ASPIRIN 81 MG PO CHEW: 81.0000 mg | CHEWABLE_TABLET | ORAL | Status: DC

## 2015-10-25 MED ORDER — HEPARIN (PORCINE) IN NACL 2-0.9 UNIT/ML-% IJ SOLN
INTRAMUSCULAR | Status: AC
  Filled 2015-10-25: qty 1000

## 2015-10-25 MED ORDER — FENTANYL CITRATE (PF) 100 MCG/2ML IJ SOLN
INTRAMUSCULAR | Status: DC | PRN
  Administered 2015-10-25: 25 ug via INTRAVENOUS

## 2015-10-25 MED ORDER — HEPARIN (PORCINE) IN NACL 100-0.45 UNIT/ML-% IJ SOLN
1600.0000 [IU]/h | INTRAMUSCULAR | Status: DC
  Administered 2015-10-25: 1600 [IU]/h via INTRAVENOUS
  Filled 2015-10-25: qty 250

## 2015-10-25 MED ORDER — SODIUM CHLORIDE 0.9 % IV SOLN: INTRAVENOUS | Status: DC

## 2015-10-25 MED ORDER — NITROGLYCERIN 1 MG/10 ML FOR IR/CATH LAB
INTRA_ARTERIAL | Status: AC
  Filled 2015-10-25: qty 10

## 2015-10-25 MED ORDER — SODIUM CHLORIDE 0.9% FLUSH: 3.0000 mL | INTRAVENOUS | Status: DC | PRN

## 2015-10-25 MED ORDER — IOPAMIDOL (ISOVUE-370) INJECTION 76%
INTRAVENOUS | Status: AC
  Filled 2015-10-25: qty 100

## 2015-10-25 MED ORDER — HEPARIN SODIUM (PORCINE) 1000 UNIT/ML IJ SOLN
INTRAMUSCULAR | Status: DC | PRN
  Administered 2015-10-25: 4500 [IU] via INTRAVENOUS

## 2015-10-25 MED ORDER — SODIUM CHLORIDE 0.9 % IV SOLN: 250.0000 mL | INTRAVENOUS | Status: DC | PRN

## 2015-10-25 MED ORDER — SODIUM CHLORIDE 0.9% FLUSH
3.0000 mL | Freq: Two times a day (BID) | INTRAVENOUS | Status: DC
  Administered 2015-10-26: 3 mL via INTRAVENOUS

## 2015-10-25 MED ORDER — ATORVASTATIN CALCIUM 80 MG PO TABS
80.0000 mg | ORAL_TABLET | Freq: Every day | ORAL | Status: DC
  Administered 2015-10-25: 80 mg via ORAL
  Filled 2015-10-25: qty 1

## 2015-10-25 MED ORDER — LIDOCAINE HCL (PF) 1 % IJ SOLN
INTRAMUSCULAR | Status: DC | PRN
  Administered 2015-10-25: 2 mL via SUBCUTANEOUS

## 2015-10-25 MED ORDER — ACETAMINOPHEN 325 MG PO TABS: 650.0000 mg | ORAL_TABLET | ORAL | Status: DC | PRN

## 2015-10-25 MED ORDER — SODIUM CHLORIDE 0.9% FLUSH: 3.0000 mL | Freq: Two times a day (BID) | INTRAVENOUS | Status: DC

## 2015-10-25 MED ORDER — HEPARIN SODIUM (PORCINE) 1000 UNIT/ML IJ SOLN
INTRAMUSCULAR | Status: AC
  Filled 2015-10-25: qty 1

## 2015-10-25 MED ORDER — FENTANYL CITRATE (PF) 100 MCG/2ML IJ SOLN
INTRAMUSCULAR | Status: AC
  Filled 2015-10-25: qty 2

## 2015-10-25 MED ORDER — ONDANSETRON HCL 4 MG/2ML IJ SOLN: 4.0000 mg | Freq: Four times a day (QID) | INTRAMUSCULAR | Status: DC | PRN

## 2015-10-25 MED ORDER — RANOLAZINE ER 500 MG PO TB12
500.0000 mg | ORAL_TABLET | Freq: Two times a day (BID) | ORAL | Status: DC
  Administered 2015-10-25 – 2015-10-26 (×2): 500 mg via ORAL
  Filled 2015-10-25 (×2): qty 1

## 2015-10-25 MED ORDER — LIDOCAINE HCL (PF) 1 % IJ SOLN
INTRAMUSCULAR | Status: AC
  Filled 2015-10-25: qty 30

## 2015-10-25 MED ORDER — HEPARIN (PORCINE) IN NACL 100-0.45 UNIT/ML-% IJ SOLN
1600.0000 [IU]/h | INTRAMUSCULAR | Status: DC
Start: 2015-10-26 — End: 2015-10-26
  Administered 2015-10-26: 1600 [IU]/h via INTRAVENOUS
  Filled 2015-10-25: qty 250

## 2015-10-25 MED ORDER — VERAPAMIL HCL 2.5 MG/ML IV SOLN
INTRAVENOUS | Status: AC
  Filled 2015-10-25: qty 2

## 2015-10-25 MED ORDER — SODIUM CHLORIDE 0.9 % IV SOLN
250.0000 mL | INTRAVENOUS | Status: DC | PRN
Start: 2015-10-25 — End: 2015-10-26

## 2015-10-25 MED ORDER — ISOSORBIDE MONONITRATE ER 30 MG PO TB24
30.0000 mg | ORAL_TABLET | Freq: Every day | ORAL | Status: DC
  Administered 2015-10-25 – 2015-10-26 (×2): 30 mg via ORAL
  Filled 2015-10-25 (×2): qty 1

## 2015-10-25 MED ORDER — SODIUM CHLORIDE 0.9 % IV SOLN
INTRAVENOUS | Status: DC
  Administered 2015-10-25: 10:00:00 via INTRAVENOUS

## 2015-10-25 SURGICAL SUPPLY — 14 items
CATH INFINITI 5 FR RCB (CATHETERS) ×1 IMPLANT
CATH INFINITI 5FR MULTPACK ANG (CATHETERS) ×1 IMPLANT
CATH INFINITI MULTIPACK ANG 4F (CATHETERS) ×1 IMPLANT
CATH SITESEER 5F NTR (CATHETERS) ×1 IMPLANT
DEVICE RAD COMP TR BAND LRG (VASCULAR PRODUCTS) ×1 IMPLANT
GLIDESHEATH SLEND SS 6F .021 (SHEATH) ×1 IMPLANT
KIT HEART LEFT (KITS) ×2 IMPLANT
PACK CARDIAC CATHETERIZATION (CUSTOM PROCEDURE TRAY) ×2 IMPLANT
SHEATH PINNACLE 5F 10CM (SHEATH) ×1 IMPLANT
TRANSDUCER W/STOPCOCK (MISCELLANEOUS) ×2 IMPLANT
TUBING CIL FLEX 10 FLL-RA (TUBING) ×2 IMPLANT
WIRE EMERALD 3MM-J .035X150CM (WIRE) ×1 IMPLANT
WIRE HI TORQ VERSACORE-J 145CM (WIRE) ×1 IMPLANT
WIRE SAFE-T 1.5MM-J .035X260CM (WIRE) ×1 IMPLANT

## 2015-10-25 NOTE — Progress Notes (Signed)
[place] for heparin Indication: chest pain/ACS  No Known Allergies  Patient Measurements: Height: 6\' 3"  (190.5 cm) Weight: 250 lb 6.4 oz (113.6 kg) IBW/kg (Calculated) : 84.5 Heparin Dosing Weight: 104.6 kg  Vital Signs: Temp: 97.7 F (36.5 C) (08/10 0352) Temp Source: Oral (08/10 0352) BP: 154/79 (08/10 0352) Pulse Rate: 56 (08/10 0352)  Labs:  Recent Labs  10/22/15 1310  10/22/15 1839 10/22/15 2345 10/23/15 0519 10/24/15 0417 10/25/15 0330  HGB 14.2  --   --   --  12.3*  --   --   HCT 45.3  --   --   --  39.8  --   --   PLT 184  --   --   --  141*  --   --   LABPROT  --   < > 28.2*  --  30.1* 26.9* 22.9*  INR  --   < > 2.58  --  2.81 2.43 1.99  CREATININE 1.12  --   --   --  1.08  --   --   TROPONINI  --   --  <0.03 <0.03 <0.03  --   --   < > = values in this interval not displayed.  Estimated Creatinine Clearance: 84 mL/min (by C-G formula based on SCr of 1.08 mg/dL).  Assessment: 70 yo male with chest pain, h/o Afib and Coumadin on hold, for heparin . INR 2.43 today.   Goal of Therapy:  Heparin level 0.3-0.7 units/ml Monitor platelets by anticoagulation protocol: Yes   Plan:   Start heparin 1600 units/hr Check heparin level in 8 hours.  Phillis Knack, PharmD, BCPS  10/25/2015 4:39 AM

## 2015-10-25 NOTE — Progress Notes (Signed)
ANTICOAGULATION CONSULT NOTE - Initial Consult  Pharmacy Consult for heparin  Indication: chest pain/ACS and atrial fibrillation  No Known Allergies  Patient Measurements: Height: 6\' 3"  (190.5 cm) Weight: 250 lb 6.4 oz (113.6 kg) IBW/kg (Calculated) : 84.5  Vital Signs: Temp: 98 F (36.7 C) (08/10 0822) Temp Source: Oral (08/10 0822) BP: 116/90 (08/10 1810) Pulse Rate: 54 (08/10 1810)  Labs:  Recent Labs  10/22/15 2345 10/23/15 0519 10/24/15 0417 10/25/15 0330  HGB  --  12.3*  --   --   HCT  --  39.8  --   --   PLT  --  141*  --   --   LABPROT  --  30.1* 26.9* 22.9*  INR  --  2.81 2.43 1.99  CREATININE  --  1.08  --   --   TROPONINI <0.03 <0.03  --   --     Estimated Creatinine Clearance: 84 mL/min (by C-G formula based on SCr of 1.08 mg/dL).   Medical History: Past Medical History:  Diagnosis Date  . CAD (coronary artery disease)    s/p CABG, s/p DES to LCX  January 2011  . Chronic pain    left sided-Kristeins  . CVA (cerebral infarction)    left hemiparesis  . Diastolic CHF ([place])   . Dysrhythmia    hx of atrial fibrilation with cardioversion  . GERD (gastroesophageal reflux disease)   . Gout   . Hyperlipidemia   . Hypertension   . Hypothyroidism   . OSA on CPAP   . Sleep apnea   . Stroke ([place])   . Tubular adenoma of colon 05/2010   Assessment: 68 yoM with admitted with CP and concern for ACS. PTA warfarin for afib held and pt began on heparin infusion.  S/p LHC and heparin to restart 10 hours post sheath removal which occurred at 15:30 today.   Goal of Therapy:  Heparin level 0.3-0.7 units/ml Monitor platelets by anticoagulation protocol: Yes   Plan:  1. Resume heparin at 0130 on 8/11 at previous rate of 1600 units/hr 2. HL 8 hours after restarting drip 3. F/u on resuming warfarin  Vincenza Hews, PharmD, BCPS 10/25/2015, 7:21 PM Pager: [phone]

## 2015-10-25 NOTE — Interval H&P Note (Signed)
Cath Lab Visit (complete for each Cath Lab visit)  Clinical Evaluation Leading to the Procedure:   ACS: No.  Non-ACS:    Anginal Classification: CCS III  Anti-ischemic medical therapy: Minimal Therapy (1 class of medications)  Non-Invasive Test Results: No non-invasive testing performed  Prior CABG: Previous CABG      History and Physical Interval Note:  10/25/2015 1:56 PM  [name]  has presented today for surgery, with the diagnosis of abnormal stress  The various methods of treatment have been discussed with the patient and family. After consideration of risks, benefits and other options for treatment, the patient has consented to  Procedure(s): Left Heart Cath and Cors/Grafts Angiography (N/A) as a surgical intervention .  The patient's history has been reviewed, patient examined, no change in status, stable for surgery.  I have reviewed the patient's chart and labs.  Questions were answered to the patient's satisfaction.     Shelva Majestic

## 2015-10-25 NOTE — Progress Notes (Signed)
     Subjective:  No CP/SOB. On IV NTG  Objective:  Temp:  [97.7 F (36.5 C)-98.7 F (37.1 C)] 98 F (36.7 C) (08/10 0822) Pulse Rate:  [56-67] 63 (08/10 0822) Resp:  [13-16] 15 (08/10 0822) BP: (153-164)/(72-81) 154/73 (08/10 0822) SpO2:  [99 %-100 %] 100 % (08/10 0822) Weight:  [250 lb 6.4 oz (113.6 kg)] 250 lb 6.4 oz (113.6 kg) (08/10 0352) Weight change: 1 lb (0.454 kg)  Intake/Output from previous day: 08/09 0701 - 08/10 0700 In: 720 [P.O.:720] Out: -   Intake/Output from this shift: Total I/O In: 27.5 [I.V.:27.5] Out: -   Physical Exam: General appearance: alert and no distress Neck: no adenopathy, no carotid bruit, no JVD, supple, symmetrical, trachea midline and thyroid not enlarged, symmetric, no tenderness/mass/nodules Lungs: clear to auscultation bilaterally Heart: irregularly irregular rhythm Extremities: extremities normal, atraumatic, no cyanosis or edema  Lab Results: Results for orders placed or performed during the hospital encounter of 10/22/15 (from the past 48 hour(s))  Protime-INR     Status: Abnormal   Collection Time: 10/24/15  4:17 AM  Result Value Ref Range   Prothrombin Time 26.9 (H) 11.4 - 15.2 seconds   INR 2.43   Glucose, capillary     Status: Abnormal   Collection Time: 10/24/15  9:10 PM  Result Value Ref Range   Glucose-Capillary 102 (H) 65 - 99 mg/dL  Protime-INR     Status: Abnormal   Collection Time: 10/25/15  3:30 AM  Result Value Ref Range   Prothrombin Time 22.9 (H) 11.4 - 15.2 seconds   INR 1.99   Glucose, capillary     Status: Abnormal   Collection Time: 10/25/15  8:19 AM  Result Value Ref Range   Glucose-Capillary 103 (H) 65 - 99 mg/dL    Imaging: Imaging results have been reviewed  Tele- NSR 60s   Assessment/Plan:   1. Principal Problem: 2.   Angina at rest [facility]) 3. Active Problems: 4.   Hyperlipidemia 5.   Essential hypertension 6.   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   History of stroke with residual deficit 8.    Diabetes type 2, controlled ([place]) 9.   PAF - CAHDs VASc=7 10.   Time Spent Directly with Patient:  20 minutes  Length of Stay:  LOS: 2 days   Adm with SSCP. + MV. H/O CABG. PAF in NSR on coumadin (on hold). INR 1.99. Exam benign. OK for cath today.  Quay Burow 10/25/2015, 10:06 AM

## 2015-10-25 NOTE — Care Management Important Message (Signed)
Important Message  Patient Details  Name: [name] MRN: [id number] Date of Birth: 1945-09-10   Medicare Important Message Given:  Yes    [name] 10/25/2015, 8:22 AM

## 2015-10-25 NOTE — H&P (View-Only) (Signed)
     Subjective:  No CP/SOB. On IV NTG  Objective:  Temp:  [97.7 F (36.5 C)-98.7 F (37.1 C)] 98 F (36.7 C) (08/10 0822) Pulse Rate:  [56-67] 63 (08/10 0822) Resp:  [13-16] 15 (08/10 0822) BP: (153-164)/(72-81) 154/73 (08/10 0822) SpO2:  [99 %-100 %] 100 % (08/10 0822) Weight:  [250 lb 6.4 oz (113.6 kg)] 250 lb 6.4 oz (113.6 kg) (08/10 0352) Weight change: 1 lb (0.454 kg)  Intake/Output from previous day: 08/09 0701 - 08/10 0700 In: 720 [P.O.:720] Out: -   Intake/Output from this shift: Total I/O In: 27.5 [I.V.:27.5] Out: -   Physical Exam: General appearance: alert and no distress Neck: no adenopathy, no carotid bruit, no JVD, supple, symmetrical, trachea midline and thyroid not enlarged, symmetric, no tenderness/mass/nodules Lungs: clear to auscultation bilaterally Heart: irregularly irregular rhythm Extremities: extremities normal, atraumatic, no cyanosis or edema  Lab Results: Results for orders placed or performed during the hospital encounter of 10/22/15 (from the past 48 hour(s))  Protime-INR     Status: Abnormal   Collection Time: 10/24/15  4:17 AM  Result Value Ref Range   Prothrombin Time 26.9 (H) 11.4 - 15.2 seconds   INR 2.43   Glucose, capillary     Status: Abnormal   Collection Time: 10/24/15  9:10 PM  Result Value Ref Range   Glucose-Capillary 102 (H) 65 - 99 mg/dL  Protime-INR     Status: Abnormal   Collection Time: 10/25/15  3:30 AM  Result Value Ref Range   Prothrombin Time 22.9 (H) 11.4 - 15.2 seconds   INR 1.99   Glucose, capillary     Status: Abnormal   Collection Time: 10/25/15  8:19 AM  Result Value Ref Range   Glucose-Capillary 103 (H) 65 - 99 mg/dL    Imaging: Imaging results have been reviewed  Tele- NSR 60s   Assessment/Plan:   1. Principal Problem: 2.   Angina at rest [facility]) 3. Active Problems: 4.   Hyperlipidemia 5.   Essential hypertension 6.   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   History of stroke with residual deficit 8.    Diabetes type 2, controlled ([place]) 9.   PAF - CAHDs VASc=7 10.   Time Spent Directly with Patient:  20 minutes  Length of Stay:  LOS: 2 days   Adm with SSCP. + MV. H/O CABG. PAF in NSR on coumadin (on hold). INR 1.99. Exam benign. OK for cath today.  Quay Burow 10/25/2015, 10:06 AM

## 2015-10-26 ENCOUNTER — Telehealth: Payer: Self-pay | Admitting: Cardiology

## 2015-10-26 ENCOUNTER — Encounter (HOSPITAL_COMMUNITY): Payer: Self-pay | Admitting: Cardiovascular Disease

## 2015-10-26 LAB — GLUCOSE, CAPILLARY
GLUCOSE-CAPILLARY: 156 mg/dL — AB (ref 65–99)
GLUCOSE-CAPILLARY: 149 mg/dL — AB (ref 65–99)

## 2015-10-26 LAB — CBC
HCT: 37.2 % — ABNORMAL LOW (ref 39.0–52.0)
HEMOGLOBIN: 11.8 g/dL — AB (ref 13.0–17.0)
MCH: 29.7 pg (ref 26.0–34.0)
MCHC: 31.7 g/dL (ref 30.0–36.0)
MCV: 93.7 fL (ref 78.0–100.0)
Platelets: 147 10*3/uL — ABNORMAL LOW (ref 150–400)
RBC: 3.97 MIL/uL — ABNORMAL LOW (ref 4.22–5.81)
RDW: 13.3 % (ref 11.5–15.5)
WBC: 6.5 10*3/uL (ref 4.0–10.5)

## 2015-10-26 LAB — PROTIME-INR
INR: 1.6
PROTHROMBIN TIME: 19.3 s — AB (ref 11.4–15.2)

## 2015-10-26 MED ORDER — WARFARIN SODIUM 2.5 MG PO TABS: ORAL_TABLET | ORAL | 0 refills | Status: DC

## 2015-10-26 MED ORDER — ENOXAPARIN (LOVENOX) PATIENT EDUCATION KIT
PACK | Freq: Once | Status: AC
  Administered 2015-10-26: 12:00:00
  Filled 2015-10-26: qty 1

## 2015-10-26 MED ORDER — ENOXAPARIN SODIUM 120 MG/0.8ML ~~LOC~~ SOLN: 120.0000 mg | Freq: Two times a day (BID) | SUBCUTANEOUS | 0 refills | Status: DC

## 2015-10-26 MED ORDER — ISOSORBIDE MONONITRATE ER 30 MG PO TB24: 30.0000 mg | ORAL_TABLET | Freq: Every day | ORAL | 6 refills | Status: DC

## 2015-10-26 MED ORDER — RANOLAZINE ER 500 MG PO TB12: 500.0000 mg | ORAL_TABLET | Freq: Two times a day (BID) | ORAL | 3 refills | Status: DC

## 2015-10-26 MED ORDER — ENOXAPARIN SODIUM 120 MG/0.8ML ~~LOC~~ SOLN
120.0000 mg | Freq: Two times a day (BID) | SUBCUTANEOUS | Status: DC
  Administered 2015-10-26: 120 mg via SUBCUTANEOUS
  Filled 2015-10-26 (×3): qty 0.8

## 2015-10-26 NOTE — Discharge Instructions (Signed)
How and Where to Give Subcutaneous Enoxaparin Injections Enoxaparin is an injectable medicine. It is used to help prevent blood clots from developing in your veins. Health care providers often use anticoagulants like enoxaparin to prevent clots following surgery. Enoxaparin is also used in combination with other medicines to treat blood clots and heart attacks. If blood clots are left untreated, they can be life threatening.  Enoxaparin comes in single-use syringes. You inject enoxaparin through a syringe into your belly (abdomen). You should change the injection site each time you give yourself a shot. Continue the enoxaparin injections as directed by your health care provider. Your health care provider will use blood clotting test results to decide when you can safely stop using enoxaparin injections. If your health care provider prescribes any additional medicines, use the medicines exactly as directed. HOW DO I INJECT ENOXAPARIN?  1. Wash your hands with soap and water. 2. Clean the selected injection site as directed by your health care provider. 3. Remove the needle cap by pulling it straight off the syringe. 4. Hold the syringe like a pencil using your writing hand. 5. Use your other hand to pinch and hold an inch of the cleansed skin. 6. Insert the entire needle straight down into the fold of skin. 7. Push the plunger with your thumb until the syringe is empty. 8. Pull the needle straight out of your skin. 9. Enoxaparin injection prefilled syringes and graduated prefilled syringes are available with a system that shields the needle after injection. After you have completed your injection and removed the needle from your skin, firmly push down on the plunger. The protective sleeve will automatically cover the needle and you will hear a click. The click means the needle is safely covered. 10. Place the syringe in the nearest needle box, also called a sharps container. If you do not have a sharps  container, you can use a hard-sided plastic container with a secure lid, such as an empty laundry detergent bottle. WHAT ELSE DO I NEED TO KNOW?  Do not use enoxaparin if:  You have allergies to heparin or pork products.  You have been diagnosed with a condition called thrombocytopenia.  Do not use the syringe or needle more than one time.  Use medicines only as directed by your health care provider.  Changes in medicines, supplements, diet, and illness can affect your anticoagulation therapy. Be sure to inform your health care provider of any of these changes.  It is important that you tell all of your health care providers and your dentist that you are taking an anticoagulant, especially if you are injured or plan to have any type of procedure.  While on anticoagulants, you will need to have blood tests done routinely as directed by your health care provider.  While using this medicine, avoid physical activities or sports that could result in a fall or cause injury.  Follow up with your laboratory test and health care provider appointments as directed. It is very important to keep your appointments. Not keeping appointments could result in a chronic or permanent injury, pain, or disability.  Before giving your medicine, you should make sure the injection is a clear and colorless or pale yellow solution. If your medicine becomes discolored or if there are particles in the syringe, do not use it and notify your health care provider.  Keep your medicine safely stored at room temperature. SEEK MEDICAL CARE IF:  You develop any rashes on your skin.  You have large  areas of bruising on your skin.  You have any worsening of the condition for which you take Enoxaparin.  You develop a fever. SEEK IMMEDIATE MEDICAL CARE IF:  You develop bleeding problems such as:  Bleeding from the gums or nose that does not stop quickly.  Vomiting blood or coughing up blood.  Blood in your  urine.  Blood in your stool, or stool that has a dark, tarry, or coffee grounds appearance.  A cut that does not stop bleeding within 10 minutes. These symptoms may represent a serious problem that is an emergency. Do not wait to see if the symptoms will go away. Get medical help right away. Call your local emergency services (911 in the [place] Do not drive yourself to the hospital.    This information is not intended to replace advice given to you by your health care provider. Make sure you discuss any questions you have with your health care provider.   Document Released: 01/03/2004 Document Revised: 03/24/2014 Document Reviewed: 08/18/2013 Elsevier Interactive Patient Education 2016 [place].   Heart-Healthy Eating Plan Many factors influence your heart health, including eating and exercise habits. Heart (coronary) risk increases with abnormal blood fat (lipid) levels. Heart-healthy meal planning includes limiting unhealthy fats, increasing healthy fats, and making other small dietary changes. This includes maintaining a healthy body weight to help keep lipid levels within a normal range. WHAT IS MY PLAN?  Your health care provider recommends that you:  Get no more than _________% of the total calories in your daily diet from fat.  Limit your intake of saturated fat to less than _________% of your total calories each day.  Limit the amount of cholesterol in your diet to less than _________ mg per day. WHAT TYPES OF FAT SHOULD I CHOOSE?  Choose healthy fats more often. Choose monounsaturated and polyunsaturated fats, such as olive oil and canola oil, flaxseeds, walnuts, almonds, and seeds.  Eat more omega-3 fats. Good choices include salmon, mackerel, sardines, tuna, flaxseed oil, and ground flaxseeds. Aim to eat fish at least two times each week.  Limit saturated fats. Saturated fats are primarily found in animal products, such as meats, butter, and cream. Plant sources of  saturated fats include palm oil, palm kernel oil, and coconut oil.  Avoid foods with partially hydrogenated oils in them. These contain trans fats. Examples of foods that contain trans fats are stick margarine, some tub margarines, cookies, crackers, and other baked goods. WHAT GENERAL GUIDELINES DO I NEED TO FOLLOW?  Check food labels carefully to identify foods with trans fats or high amounts of saturated fat.  Fill one half of your plate with vegetables and green salads. Eat 4-5 servings of vegetables per day. A serving of vegetables equals 1 cup of raw leafy vegetables,  cup of raw or cooked cut-up vegetables, or  cup of vegetable juice.  Fill one fourth of your plate with whole grains. Look for the word "whole" as the first word in the ingredient list.  Fill one fourth of your plate with lean protein foods.  Eat 4-5 servings of fruit per day. A serving of fruit equals one medium whole fruit,  cup of dried fruit,  cup of fresh, frozen, or canned fruit, or  cup of 100% fruit juice.  Eat more foods that contain soluble fiber. Examples of foods that contain this type of fiber are apples, broccoli, carrots, beans, peas, and barley. Aim to get 20-30 g of fiber per day.  Eat more home-cooked  food and less restaurant, buffet, and fast food.  Limit or avoid alcohol.  Limit foods that are high in starch and sugar.  Avoid fried foods.  Cook foods by using methods other than frying. Baking, boiling, grilling, and broiling are all great options. Other fat-reducing suggestions include:  Removing the skin from poultry.  Removing all visible fats from meats.  Skimming the fat off of stews, soups, and gravies before serving them.  Steaming vegetables in water or broth.  Lose weight if you are overweight. Losing just 5-10% of your initial body weight can help your overall health and prevent diseases such as diabetes and heart disease.  Increase your consumption of nuts, legumes, and  seeds to 4-5 servings per week. One serving of dried beans or legumes equals  cup after being cooked, one serving of nuts equals 1 ounces, and one serving of seeds equals  ounce or 1 tablespoon.  You may need to monitor your salt (sodium) intake, especially if you have high blood pressure. Talk with your health care provider or dietitian to get more information about reducing sodium. WHAT FOODS CAN I EAT? Grains Breads, including [place], white, pita, wheat, raisin, rye, oatmeal, and [place]. Tortillas that are neither fried nor made with lard or trans fat. Low-fat rolls, including hotdog and hamburger buns and English muffins. Biscuits. Muffins. Waffles. Pancakes. Light popcorn. Whole-grain cereals. Flatbread. Melba toast. Pretzels. Breadsticks. Rusks. Low-fat snacks and crackers, including oyster, saltine, matzo, graham, animal, and rye. Rice and pasta, including brown rice and those that are made with whole wheat. Vegetables All vegetables. Fruits All fruits, but limit coconut. Meats and Other Protein Sources Lean, well-trimmed beef, veal, pork, and lamb. Chicken and [place] without skin. All fish and shellfish. Wild duck, rabbit, pheasant, and venison. Egg whites or low-cholesterol egg substitutes. Dried beans, peas, lentils, and tofu.Seeds and most nuts. Dairy Low-fat or nonfat cheeses, including ricotta, string, and mozzarella. Skim or 1% milk that is liquid, powdered, or evaporated. Buttermilk that is made with low-fat milk. Nonfat or low-fat yogurt. Beverages Mineral water. Diet carbonated beverages. Sweets and Desserts Sherbets and fruit ices. Honey, jam, marmalade, jelly, and syrups. Meringues and gelatins. Pure sugar candy, such as hard candy, jelly beans, gumdrops, mints, marshmallows, and small amounts of dark chocolate. [employer] Eat all sweets and desserts in moderation. Fats and Oils Nonhydrogenated (trans-free) margarines. Vegetable oils, including soybean, sesame,  sunflower, olive, peanut, safflower, corn, canola, and cottonseed. Salad dressings or mayonnaise that are made with a vegetable oil. Limit added fats and oils that you use for cooking, baking, salads, and as spreads. Other Cocoa powder. Coffee and tea. All seasonings and condiments. The items listed above may not be a complete list of recommended foods or beverages. Contact your dietitian for more options. WHAT FOODS ARE NOT RECOMMENDED? Grains Breads that are made with saturated or trans fats, oils, or whole milk. Croissants. Butter rolls. Cheese breads. Sweet rolls. Donuts. Buttered popcorn. Chow mein noodles. High-fat crackers, such as cheese or butter crackers. Meats and Other Protein Sources Fatty meats, such as hotdogs, short ribs, sausage, spareribs, bacon, ribeye roast or steak, and mutton. High-fat deli meats, such as salami and bologna. Caviar. Domestic duck and goose. Organ meats, such as kidney, liver, sweetbreads, brains, gizzard, chitterlings, and heart. Dairy Cream, sour cream, cream cheese, and creamed cottage cheese. Whole milk cheeses, including blue (bleu), Monte[place] Jack, [place], [place], American, [place], Swiss, [place], [place], and [place]. Whole or 2% milk that is liquid, evaporated, or condensed.  Whole buttermilk. Cream sauce or high-fat cheese sauce. Yogurt that is made from whole milk. Beverages Regular sodas and drinks with added sugar. Sweets and Desserts Frosting. Pudding. Cookies. Cakes other than angel food cake. Candy that has milk chocolate or white chocolate, hydrogenated fat, butter, coconut, or unknown ingredients. Buttered syrups. Full-fat ice cream or ice cream drinks. Fats and Oils Gravy that has suet, meat fat, or shortening. Cocoa butter, hydrogenated oils, palm oil, coconut oil, palm kernel oil. These can often be found in baked products, candy, fried foods, nondairy creamers, and whipped toppings. Solid fats and shortenings, including bacon fat, salt pork,  lard, and butter. Nondairy cream substitutes, such as coffee creamers and sour cream substitutes. Salad dressings that are made of unknown oils, cheese, or sour cream. The items listed above may not be a complete list of foods and beverages to avoid. Contact your dietitian for more information.   This information is not intended to replace advice given to you by your health care provider. Make sure you discuss any questions you have with your health care provider.   Document Released: 12/11/2007 Document Revised: 03/24/2014 Document Reviewed: 08/25/2013 Elsevier Interactive Patient Education 2016 [place] Refer to this sheet in the next few weeks. These instructions provide you with information about caring for yourself after your procedure. Your health care provider may also give you more specific instructions. Your treatment has been planned according to current medical practices, but problems sometimes occur. Call your health care provider if you have any problems or questions after your procedure. WHAT TO EXPECT AFTER THE PROCEDURE After your procedure, it is typical to have the following:  Bruising at the radial site that usually fades within 1-2 weeks.  Blood collecting in the tissue (hematoma) that may be painful to the touch. It should usually decrease in size and tenderness within 1-2 weeks. HOME CARE INSTRUCTIONS  Take medicines only as directed by your health care provider.  You may shower 24-48 hours after the procedure or as directed by your health care provider. Remove the bandage (dressing) and gently wash the site with plain soap and water. Pat the area dry with a clean towel. Do not rub the site, because this may cause bleeding.  Do not take baths, swim, or use a hot tub until your health care provider approves.  Check your insertion site every day for redness, swelling, or drainage.  Do not apply powder or lotion to the site.  Do not flex or bend  the affected arm for 24 hours or as directed by your health care provider.  Do not push or pull heavy objects with the affected arm for 24 hours or as directed by your health care provider.  Do not lift over 10 lb (4.5 kg) for 5 days after your procedure or as directed by your health care provider.  Ask your health care provider when it is okay to:  Return to work or school.  Resume usual physical activities or sports.  Resume sexual activity.  Do not drive home if you are discharged the same day as the procedure. Have someone else drive you.  You may drive 24 hours after the procedure unless otherwise instructed by your health care provider.  Do not operate machinery or power tools for 24 hours after the procedure.  If your procedure was done as an outpatient procedure, which means that you went home the same day as your procedure, a responsible adult should be with you  for the first 24 hours after you arrive home.  Keep all follow-up visits as directed by your health care provider. This is important. SEEK MEDICAL CARE IF:  You have a fever.  You have chills.  You have increased bleeding from the radial site. Hold pressure on the site. SEEK IMMEDIATE MEDICAL CARE IF:  You have unusual pain at the radial site.  You have redness, warmth, or swelling at the radial site.  You have drainage (other than a small amount of blood on the dressing) from the radial site.  The radial site is bleeding, and the bleeding does not stop after 30 minutes of holding steady pressure on the site.  Your arm or hand becomes pale, cool, tingly, or numb.   This information is not intended to replace advice given to you by your health care provider. Make sure you discuss any questions you have with your health care provider.   Document Released: 04/05/2010 Document Revised: 03/24/2014 Document Reviewed: 09/19/2013 Elsevier Interactive Patient Education [employer].

## 2015-10-26 NOTE — Discharge Summary (Signed)
Discharge Summary    Patient ID: [name],  MRN: [id number], DOB/AGE: 09/24/1945 70 y.o.  Admit date: 10/22/2015 Discharge date: 10/26/2015  Primary Care Provider: O'SULLIVAN,[name] S. Primary Cardiologist: Dr. 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  Previously noted 95% ostial circumflex stenosis with a patent stent in the calcified segment of the mid circumflex and occluded OM 2 vessel at its origin and antegrade and competitve filling of the AV groove circumflex and marginal branch.  The native RCA which was previously noted to be small caliber nondominant vessel was unable to be visualized.  Patent LIMA graft supplying the mid LAD.  Patent SVG Y graft with the superior limb supplying the first diagonal branch of the LAD and the inferior limb supplying the circumflex marginal 2 vessel.  Previous occlusion of the stented vein graft which had supplied the acute margin branch.  Previous occlusion of the vein graft which had supplied a normal three-vessel.  LVEDP 11 mm Hg.  RECOMMENDATION: Increased medical therapy will be recommended with the addition of nitrates and Ranexa to his current medical regimen.       History of Present Illness     hx of CAD s/p CABG-2004, PAF, prior CVA. Myoview in 2013 was neg for ischemia. He has residual gait difficulty 2/2 to prior CVA. Last seen by Dr. Anna [name] 3/16.    Admitted in 2/17 from AFib Clinic with AF with RVR. He had undergone DCCV earlier in 2/17 for AF with RVR. HR was in 150s. He was placed on Amiodarone gtt. He underwent DCCV with return of NSR and was DC home on Amiodarone.  On visit 06/05/15 he was dizzy and bradycardic.  BB was reduced then stopped.    Echo 2/17: Mild LVH, EF 50-55%, no RWMA, normal diastolic function, mild LAE, mod TR  Carotid US 5/15: bilat ICA 1-39%  He presents to ER 10/22/15 with chest pain chest and SOB.  Pain has been off and on for 2 weks, but yesterday and last night increased. Described as mid sternal chest tightness, usually brief but last night more severe and some into shoulders no SOB, no nausea.  No NTG he was not sure what to do. He is on warfarin.  He has continued chest pain 5-6/10 currently.    EKG with no acute changes.  Troponin is neg.    Recent botox in arm and leg for residual stroke symptoms.     Hospital Course     Consultants: None  The patient was admitted and started on IV nitro and coumadin was held. Troponin x 3 negative. Stress test was Intermediate stress nuclear study with small prior apical infarct vs thinning with mild apical ischemia and mild to moderate ischemia in the inferolateral wall; EF 49 with mild global hypokinesis.  IV heparin started once INR < 2. Cath done by Dr. Claiborne [name] that showed no culprit lesions with  stable anatomy as described above. Coumadin resumed post cath. Added Imdur 30mg  and Ranexa500mg  BID - uptitrate as an OP if ongoing chest pain. Since he's had a CVA in the past will do a lovenox bridge. Coumadin 2.5mg  Fri, sat and sun and recheck coumadin in [name]. BEMT during TCM. Not on ASA due to need to anticoagulation, can be restart if ok with primary cardiology.   The patient has been seen by Dr. Gwenlyn [name]  today and deemed ready for discharge home. All follow-up appointments have been scheduled. Discharge medications are listed below.     10/23/2015: Cholesterol 142; HDL 42; LDL Cholesterol 75; Triglycerides 127; VLDL 25  Discharge Vitals Blood pressure 120/64, pulse 66, temperature 98.7 F (37.1 C), temperature source Oral, resp. rate 20, height 6\' 3"  (1.905 m), weight 251 lb 9.6 oz (114.1 kg), SpO2 95 %.  Filed Weights   10/24/15 0500 10/25/15 0352 10/26/15 0500  Weight: 249 lb 6.4 oz (113.1 kg) 250 lb 6.4 oz (113.6 kg) 251 lb 9.6 oz (114.1 kg)    Labs & Radiologic Studies     CBC  Recent Labs  10/26/15 0318  WBC 6.5  HGB 11.8*  HCT 37.2*  MCV 93.7  PLT [id number]*   Basic Metabolic Panel No results for input(s): NA, K, CL, CO2, GLUCOSE, BUN, CREATININE, CALCIUM, MG, PHOS in the last 72 hours. Liver Function Tests No results for input(s): AST, ALT, ALKPHOS, BILITOT, PROT, ALBUMIN in the last 72 hours. No results for input(s): LIPASE, AMYLASE in the last 72 hours. Cardiac Enzymes No results for input(s): CKTOTAL, CKMB, CKMBINDEX, TROPONINI in the last 72 hours. BNP Invalid input(s): POCBNP D-Dimer No results for input(s): DDIMER in the last 72 hours. Hemoglobin A1C No results for input(s): HGBA1C in the last 72 hours. Fasting Lipid Panel No results for input(s): CHOL, HDL, LDLCALC, TRIG, CHOLHDL, LDLDIRECT in the last 72 hours. Thyroid Function Tests No results for input(s): TSH, T4TOTAL, T3FREE, THYROIDAB in the last 72 hours.  Invalid input(s): FREET3  Dg Chest 2 View  Result Date: 10/22/2015 CLINICAL DATA:  Lightheaded today, dizziness, weakness, history hypertension, stroke, diastolic CHF, coronary artery disease post stenting and CABG EXAM: CHEST  2 VIEW COMPARISON:  08/31/2015 FINDINGS: Upper normal heart size post CABG. Atherosclerotic calcification and tortuosity of thoracic aorta. Mediastinal contours and pulmonary vascularity normal. Elevation of RIGHT diaphragm, chronic, with associated RIGHT basilar atelectasis. Remaining lungs clear. No pleural effusion or pneumothorax. RIGHT AC joint  degenerative changes. IMPRESSION: Chronic elevation of RIGHT diaphragm with RIGHT basilar atelectasis. Post CABG. No acute abnormalities. Electronically Signed   By: [name] M.D.   On: 10/22/2015 14:02   Nm Myocar Multi W/spect W/wall Motion / Ef  Result Date: 10/23/2015  There was no ST segment deviation noted during stress.  Defect 1: There is a medium defect of moderate severity present in the basal inferolateral and mid inferolateral location.  Defect 2: There is a small defect of moderate severity present in the apex location.  This is an intermediate risk study.  The left ventricular ejection fraction is mildly decreased (45-54%).  Intermediate stress nuclear study with small prior apical infarct vs thinning with mild apical ischemia and mild to moderate ischemia in the inferolateral wall; EF 49 with mild global hypokinesis.    Disposition   Pt is being discharged home today in good condition.  Follow-up Plans & Appointments    Follow-up Information    [place] [facility]. Go on 10/29/2015.  Specialty:  Cardiology Why:  9:15am for coumadin check Contact information: [address], [address] [place], PA-C. Go on 11/05/2015.   Specialties:  Cardiology, Physician Assistant Why:  @2 :15pm for TCM  Contact information: [address] [id number] [phone]          Discharge Instructions    Diet - low sodium heart healthy    Complete by:  As directed   Discharge instructions    Complete by:  As directed   No driving for 48 hours. No lifting over 5 lbs for 1 week. No sexual activity for 1 week. Keep procedure site clean & dry. If you notice increased pain, swelling, bleeding or pus, call/return!  You may shower, but no soaking baths/hot tubs/pools for 1 week.    Hold metformin tonight, resume tomorrow 10/27/15.   Take warfarin 2.5mg   Friday,saturday and Sunday with  Lovenox and check INR Monday in clinic   Increase activity slowly    Complete by:  As directed      Discharge Medications   Current Discharge Medication List    START taking these medications   Details  enoxaparin (LOVENOX) 120 MG/0.8ML injection Inject 0.8 mLs (120 mg total) into the skin every 12 (twelve) hours. Starting tonight Qty: 6 Syringe, Refills: 0    isosorbide mononitrate (IMDUR) 30 MG 24 hr tablet Take 1 tablet (30 mg total) by mouth daily. Qty: 30 tablet, Refills: 6    ranolazine (RANEXA) 500 MG 12 hr tablet Take 1 tablet (500 mg total) by mouth 2 (two) times daily. Qty: 60 tablet, Refills: 3      CONTINUE these medications which have CHANGED   Details  warfarin (COUMADIN) 2.5 MG tablet Take warfarin 2.5mg  tonight, Saturday and Sunday and recheck INR in clinic Monday Qty: 30 tablet, Refills: 0      CONTINUE these medications which have NOT CHANGED   Details  amiodarone (PACERONE) 200 MG tablet Take 200 mg by mouth daily.    atorvastatin (LIPITOR) 40 MG tablet Take two tablets ( 80 mg total ) by mouth daily    benazepril (LOTENSIN) 10 MG tablet Take 1 tablet (10 mg total) by mouth 2 (two) times daily. Qty: 60 tablet, Refills: 11   Associated Diagnoses: Essential hypertension    fluticasone (FLONASE) 50 MCG/ACT nasal spray Place 2 sprays into both nostrils daily. Qty: 9.9 g, Refills: 1    furosemide (LASIX) 40 MG tablet Take 1 tablet (40 mg total) by mouth every other day. Qty: 45 tablet, Refills: 1    levothyroxine (SYNTHROID, LEVOTHROID) 112 MCG tablet TAKE 1 TABLET (112 MCG TOTAL) BY MOUTH DAILY BEFORE BREAKFAST. Qty: 90 tablet, Refills: 1    metFORMIN (GLUCOPHAGE) 500 MG tablet TAKE 1 TABLET EVERY DAY WITH BREAKFAST Qty: 90 tablet, Refills: 1    Multiple Vitamins-Minerals (MULTIVITAMIN GUMMIES MENS PO) Take 1 tablet by mouth daily.    nitroGLYCERIN (NITROSTAT) 0.4 MG SL tablet Place 1 tablet (0.4 mg total) under the tongue every 5 (five) minutes as  needed for chest pain. Up to 3 doses Qty: 10 tablet, Refills: 2    pantoprazole (PROTONIX) 40 MG tablet Take 1 tablet (40 mg total) by mouth daily. Qty: 90 tablet, Refills: 1    traMADol (ULTRAM) 50 MG tablet Take 1 tablet (50 mg total) by mouth 2 (two) times daily. Qty: 180 tablet, Refills: 1    traMADol (ULTRAM-ER) 200 MG  24 hr tablet TAKE 1 TABLET EVERY DAY WITH AM DOSE OF TRAMADOL 50 MG Qty: 90 tablet, Refills: 1    vitamin B-12 1000 MCG tablet Take 1 tablet (1,000 mcg total) by mouth daily. Qty: 30 tablet, Refills: 0    allopurinol (ZYLOPRIM) 100 MG tablet TAKE 2 TABLETS BY MOUTH DAILY. D/C PREVIOUS SCRIPTS FOR THIS MEDICATION Qty: 180 tablet, Refills: 1           Outstanding Labs/Studies   BMET during TCM, INR check Monday 10/29/15  Duration of Discharge Encounter   Greater than 30 minutes including physician time.  Signed, Willian Donson PA-C 10/26/2015, 10:38 AM

## 2015-10-26 NOTE — Telephone Encounter (Signed)
New message     TCM appt with [name] on  8.21 @ 8:15 pm per Vin Pa.

## 2015-10-26 NOTE — Progress Notes (Addendum)
Attempted lovenox education with patient.  Patient has history of CVA with left sided residual weakness.  Patient unable to give shot to himself.  Patient stated his wife could not do shots but his daughter, Velta Addison could. Lovenox Video shown and verbal instruction given to patient. Care management notified.    Spoke to patient's daughter Velta Addison.  She stated that she was a Lawyer and is able and available  to administer lovenox every 12 hours for [name].  Lovenox starter kit explained to both patient and daughter.  How to administer lovenox also explained to patient and daughter.  Will proceed with discharge.

## 2015-10-26 NOTE — Consult Note (Signed)
   [facility] CM Inpatient Consult   10/26/2015  [name] 14-Aug-1945 [id number]  Referral received to assess for care management services.    Met with the patient regarding the benefits of [place] [facility] services .Explained that [place] [facility] is a covered benefit of insurance. Confirmed that his primary care provider is [facility]  Review information for [facility] and a brochure was provided with contact information.  Explained that [place] [facility] does not interfere with or replace any services arranged by the inpatient care management staff.  Patient declined services with [facility].  States, "I don't need anything right now." Denies issue with post hospital follow up or medication needs or affordability concerns.  Patient encouraged to contact [facility] and contact information was given.  Natividad Brood, RN BSN [facility] Liaison  [phone] business mobile phone Toll free office [phone]

## 2015-10-26 NOTE — Progress Notes (Addendum)
Subjective:  No CP/SOB. S/P LHC yesterday. Med Rx  Objective:  Temp:  [98 F (36.7 C)-98.7 F (37.1 C)] 98.7 F (37.1 C) (08/11 0800) Pulse Rate:  [0-87] 66 (08/11 0500) Resp:  [0-22] 20 (08/11 0500) BP: (95-175)/(57-93) 120/64 (08/11 0500) SpO2:  [0 %-100 %] 95 % (08/11 0500) Weight:  [251 lb 9.6 oz (114.1 kg)] 251 lb 9.6 oz (114.1 kg) (08/11 0500) Weight change: 1 lb 3.2 oz (0.544 kg)  Intake/Output from previous day: 08/10 0701 - 08/11 0700 In: 360 [I.V.:360] Out: 300 [Urine:300]  Intake/Output from this shift: No intake/output data recorded.  Physical Exam: General appearance: alert and no distress Neck: no adenopathy, no carotid bruit, no JVD, supple, symmetrical, trachea midline and thyroid not enlarged, symmetric, no tenderness/mass/nodules Lungs: clear to auscultation bilaterally Heart: regular rate and rhythm, S1, S2 normal, no murmur, click, rub or gallop Extremities: extremities normal, atraumatic, no cyanosis or edema  Lab Results: Results for orders placed or performed during the hospital encounter of 10/22/15 (from the past 48 hour(s))  Glucose, capillary     Status: Abnormal   Collection Time: 10/24/15  9:10 PM  Result Value Ref Range   Glucose-Capillary 102 (H) 65 - 99 mg/dL  Protime-INR     Status: Abnormal   Collection Time: 10/25/15  3:30 AM  Result Value Ref Range   Prothrombin Time 22.9 (H) 11.4 - 15.2 seconds   INR 1.99   Glucose, capillary     Status: Abnormal   Collection Time: 10/25/15  8:19 AM  Result Value Ref Range   Glucose-Capillary 103 (H) 65 - 99 mg/dL  Protime-INR     Status: Abnormal   Collection Time: 10/26/15  3:18 AM  Result Value Ref Range   Prothrombin Time 19.3 (H) 11.4 - 15.2 seconds   INR 1.60   CBC     Status: Abnormal   Collection Time: 10/26/15  3:18 AM  Result Value Ref Range   WBC 6.5 4.0 - 10.5 K/uL   RBC 3.97 (L) 4.22 - 5.81 MIL/uL   Hemoglobin 11.8 (L) 13.0 - 17.0 g/dL   HCT 37.2 (L) 39.0 - 52.0 %   MCV 93.7 78.0 - 100.0 fL   MCH 29.7 26.0 - 34.0 pg   MCHC 31.7 30.0 - 36.0 g/dL   RDW 13.3 11.5 - 15.5 %   Platelets 147 (L) 150 - 400 K/uL  Glucose, capillary     Status: Abnormal   Collection Time: 10/26/15  8:12 AM  Result Value Ref Range   Glucose-Capillary 156 (H) 65 - 99 mg/dL    Imaging: Imaging results have been reviewed  Tele- NSR  Assessment/Plan:   1. Principal Problem: 2.   Angina at rest [facility]) 3. Active Problems: 4.   Hyperlipidemia 5.   Essential hypertension 6.   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   History of stroke with residual deficit 8.   Diabetes type 2, controlled ([place]) 9.   PAF - CAHDs VASc=7 10.   Time Spent Directly with Patient:  20 minutes  Length of Stay:  LOS: 3 days   Pt admitted with [place]. Cath yesterday showed no culprit lesions with stable anatomy ([name]). No CP/SOB. Exam benign. Labs OK. Med Rx recommended. Will start imdur 30 mg and can add Ranexa as an OP. OK for DC home today. In NSR s/p DCCV on Amio. Can restart PO coumadin now with close OP coumadin clinic F/U, TOC 7 with MLP the ROV with Dr. Aundra Dubin  3-4 weeks. Since he's had a CVA in the past will do a lovenox bridge.   Quay Burow 10/26/2015, 8:40 AM

## 2015-10-26 NOTE — Progress Notes (Signed)
ANTICOAGULATION CONSULT NOTE - Follow Up Consult  Pharmacy Consult for Heparin >> Lovenox, Coumadin Indication: afib with hx CVA  No Known Allergies  Patient Measurements: Height: 6\' 3"  (190.5 cm) Weight: 251 lb 9.6 oz (114.1 kg) IBW/kg (Calculated) : 84.5  Vital Signs: Temp: 98.7 F (37.1 C) (08/11 0800) Temp Source: Oral (08/11 0800) BP: 120/64 (08/11 0500) Pulse Rate: 66 (08/11 0500)  Labs:  Recent Labs  10/24/15 0417 10/25/15 0330 10/26/15 0318  HGB  --   --  11.8*  HCT  --   --  37.2*  PLT  --   --  147*  LABPROT 26.9* 22.9* 19.3*  INR 2.43 1.99 1.60    Estimated Creatinine Clearance: 84.2 mL/min (by C-G formula based on SCr of 1.08 mg/dL).   Medications:  Scheduled:  . allopurinol  100 mg Oral BID  . amiodarone  200 mg Oral Daily  . aspirin EC  81 mg Oral Daily  . atorvastatin  80 mg Oral q1800  . benazepril  10 mg Oral Daily  . enoxaparin (LOVENOX) injection  120 mg Subcutaneous Q12H  . enoxaparin   Does not apply Once  . fluticasone  2 spray Each Nare Daily  . furosemide  40 mg Oral QODAY  . isosorbide mononitrate  30 mg Oral Daily  . levothyroxine  112 mcg Oral QAC breakfast  . multivitamin with minerals  1 tablet Oral Daily  . pantoprazole  40 mg Oral Daily  . polyethylene glycol  17 g Oral Daily  . ranolazine  500 mg Oral BID  . sodium chloride flush  3 mL Intravenous Q12H  . traMADol  50 mg Oral Q6H  . cyanocobalamin  1,000 mcg Oral Daily   Infusions:  . sodium chloride 10 mL/hr at 10/22/15 2030  . sodium chloride      Assessment: 70 yo M on Coumadin PTA for hx afib/CVA.  Coumadin was held and patient was bridged with heparin for cardiac cath.  Plan to discharge today on Lovenox and Coumadin bridge.  Home Coumadin dose is 2.5mg  MWF, 1.25mg  all other days (last dose 8/7).  INR today down to 1.6.  Goal of Therapy:  INR 2-3 Anti-Xa level 0.6-1 units/ml 4hrs after LMWH dose given Monitor platelets by anticoagulation protocol: Yes   Plan:   Discontinue heparin and associated labs. Lovenox 1mg /kg q12h - first dose 1hr after Heparin stopped. Coumadin 2.5mg  daily until INR recheck at clinic on Monday.  [employer], Pharm.D., BCPS Clinical Pharmacist Pager [phone] 10/26/2015 8:54 AM

## 2015-10-29 NOTE — Telephone Encounter (Signed)
Patient contacted regarding discharge from [facility] on 10/26/15.  Patient understands to follow up with provider [name] PA-C on 11/05/15 at 2:15 at [facility]. Patient understands discharge instructions? yes Patient understands medications and regiment? yes Patient understands to bring all medications to this visit? yes  I spoke with the pt's wife as the pt is currently lying down.  She said the pt missed his INR appointment this morning due to his bowels moving. The pt took mirilax last night and it took effect this morning and the pt was unable to make it to the bathroom.  I have rescheduled INR check to tomorrow and Elberta Leatherwood Pharm-D aware.

## 2015-11-01 ENCOUNTER — Encounter: Payer: Self-pay | Admitting: Medical

## 2015-11-01 ENCOUNTER — Ambulatory Visit (INDEPENDENT_AMBULATORY_CARE_PROVIDER_SITE_OTHER): Payer: Medicare Other | Admitting: Medical

## 2015-11-01 ENCOUNTER — Telehealth: Payer: Self-pay | Admitting: Medical

## 2015-11-01 ENCOUNTER — Ambulatory Visit (HOSPITAL_BASED_OUTPATIENT_CLINIC_OR_DEPARTMENT_OTHER)
Admission: RE | Admit: 2015-11-01 | Discharge: 2015-11-01 | Disposition: A | Discharge status: Home / Self Care | Payer: Medicare Other | Source: Ambulatory Visit | Attending: Medical | Admitting: Medical

## 2015-11-01 VITALS — BP 110/72 | HR 78 | Temp 98.0°F | Ht 75.0 in

## 2015-11-01 DIAGNOSIS — E785 Hyperlipidemia, unspecified: Secondary | ICD-10-CM

## 2015-11-01 DIAGNOSIS — Z8673 Personal history of transient ischemic attack (TIA), and cerebral infarction without residual deficits: Secondary | ICD-10-CM

## 2015-11-01 DIAGNOSIS — R5383 Other fatigue: Secondary | ICD-10-CM

## 2015-11-01 DIAGNOSIS — I10 Essential (primary) hypertension: Secondary | ICD-10-CM

## 2015-11-01 DIAGNOSIS — Z8679 Personal history of other diseases of the circulatory system: Secondary | ICD-10-CM | POA: Diagnosis not present

## 2015-11-01 DIAGNOSIS — K5900 Constipation, unspecified: Secondary | ICD-10-CM | POA: Diagnosis not present

## 2015-11-01 DIAGNOSIS — R197 Diarrhea, unspecified: Secondary | ICD-10-CM

## 2015-11-01 LAB — CBC WITH DIFFERENTIAL/PLATELET
BASOS ABS: 51 {cells}/uL (ref 0–200)
Basophils Relative: 1 %
EOS ABS: 102 {cells}/uL (ref 15–500)
EOS PCT: 2 %
HEMATOCRIT: 39.9 % (ref 38.5–50.0)
HEMOGLOBIN: 13.4 g/dL (ref 13.2–17.1)
LYMPHS ABS: 867 {cells}/uL (ref 850–3900)
Lymphocytes Relative: 17 %
MCH: 30.2 pg (ref 27.0–33.0)
MCHC: 33.6 g/dL (ref 32.0–36.0)
MCV: 89.9 fL (ref 80.0–100.0)
MONO ABS: 459 {cells}/uL (ref 200–950)
MPV: 11 fL (ref 7.5–12.5)
Monocytes Relative: 9 %
NEUTROS PCT: 71 %
Neutro Abs: 3621 cells/uL (ref 1500–7800)
Platelets: 162 10*3/uL (ref 140–400)
RBC: 4.44 MIL/uL (ref 4.20–5.80)
RDW: 13.8 % (ref 11.0–15.0)
WBC: 5.1 10*3/uL (ref 3.8–10.8)

## 2015-11-01 LAB — COMPREHENSIVE METABOLIC PANEL
ALBUMIN: 3.6 g/dL (ref 3.6–5.1)
ALT: 130 U/L — ABNORMAL HIGH (ref 9–46)
AST: 89 U/L — ABNORMAL HIGH (ref 10–35)
Alkaline Phosphatase: 51 U/L (ref 40–115)
BUN: 27 mg/dL — AB (ref 7–25)
CALCIUM: 8.7 mg/dL (ref 8.6–10.3)
CHLORIDE: 100 mmol/L (ref 98–110)
CO2: 30 mmol/L (ref 20–31)
Creat: 1.4 mg/dL — ABNORMAL HIGH (ref 0.70–1.18)
Glucose, Bld: 136 mg/dL — ABNORMAL HIGH (ref 65–99)
POTASSIUM: 4.1 mmol/L (ref 3.5–5.3)
Sodium: 137 mmol/L (ref 135–146)
TOTAL PROTEIN: 6.5 g/dL (ref 6.1–8.1)
Total Bilirubin: 0.8 mg/dL (ref 0.2–1.2)

## 2015-11-01 NOTE — Telephone Encounter (Signed)
Will you call [address] coumadin clinic to see if they will see pt tomorrow before the weekend. Pt was supposed to see them on Monday but canceled. It is post hospitalization follow up. He had to have his coumadin held before cardiac cath. They should be aware of this pt. Can you get me answer before they live today.

## 2015-11-01 NOTE — Progress Notes (Signed)
Subjective:    Patient ID: [name], male    DOB: Feb 16, 1946, 70 y.o.   MRN: [id number]  HPI Below is portions of admission note by NP/Dr. Aundra Dubin reviewed today  Chief Complaint: chest pain  HPI: hx of CAD s/p CABG-2004, PAF, prior CVA. Myoview in 2013 was neg for ischemia. He has residual gait difficulty 2/2 to prior CVA. Last seen by [name] 3/16.   Admitted in 2/17 from AFib Clinic with AF with RVR. He had undergone DCCV earlier in 2/17 for AF with RVR. HR was in 150s. He was placed on Amiodarone gtt. He underwent DCCV with return of NSR and was DC home on Amiodarone.  On visit 06/05/15 he was dizzy and bradycardic.  BB was reduced then stopped.   Myoview 8/13 Overall Impression: Normal stress nuclear study with a small, mild, fixed inferior defect consistent with inferior thinning but no ischemia. LV Ejection Fraction: 62%. LV Wall Motion: NL LV Function; NL Wall Motion  day he presents to ER with a feeling in his chest.   EKG with no acute changes.  Troponin is neg.  Pt with SOB yesterday and today chest pain. Pain has been off and on for 2 weks, but yesterday and last night increased mid sternal chest tightness, usually brief but last night more severe and some into shoulders no SOB, no nausea.  No NTG he was not sure what to do.  Came into today.  He is on warfarin.  He has continued chest pain 5-6/10 currently.       Recent botox in arm and leg for residual stroke symptoms.    Assessment/Plan Principal Problem:   Angina at rest [facility]) has done well for several years, will admit and begin IV NTG for his pain place on stepdown, serial tropoins, hold coumadin, begin plan nuc study tomorrow if enzymes are negative.   Active Problems:   Hyperlipidemia- continue statin last checked in 12/2014 and stable will check in am   Essential hypertension- elevated initially but improved now.     Hx of CABG '04- lst cath 2011, last nuc 2013.    History of  stroke with residual deficit- recent injection of botox for Lt side residual.    Diabetes type 2, controlled (HCC) SSI and hold metformin    PAF - CAHDs VASc=7 hold coumadin and begin heparin per pharmacy if INR < 2.0   Discharge summary reviewed. Below copy of.  Principal Problem:   Angina at rest [facility]) Active Problems:   Hyperlipidemia   Essential hypertension   Hx of CABG '04   History of stroke with residual deficit   Diabetes type 2, controlled (HCC)   PAF - CAHDs VASc=7  Left Heart Cath and Cors/Grafts Angiography  Conclusion   Severe native CAD with previously noted 90% ostial LAD stenosis with occluded first diagonal arising from the LAD and a flush and fill phenomena in the mid LAD due to competitive LIMA filling.  Previously noted 95% ostial circumflex stenosis with a patent stent in the calcified segment of the mid circumflex and occluded OM 2 vessel at its origin and antegrade and competitve filling of the AV groove circumflex and marginal branch.  The native RCA which was previously noted to be small caliber nondominant vessel was unable to be visualized.  Patent LIMA graft supplying the mid LAD.  Patent SVG Y graft with the superior limb supplying the first diagonal branch of the LAD and the inferior limb supplying the  circumflex marginal 2 vessel.  Previous occlusion of the stented vein graft which had supplied the acute margin branch.  Previous occlusion of the vein graft which had supplied a normal three-vessel.  LVEDP 11 mm Hg.  RECOMMENDATION: Increased medical therapy will be recommended with the addition of nitrates and Ranexa to his current medical regimen.   Hospital course. The patient was admitted and started on IV nitro and coumadin was held. Troponin x 3 negative. Stress test was Intermediate stress nuclear study with small prior apical infarct vs thinning with mild apical ischemia and mild to moderate ischemia in the inferolateral wall; EF  49 with mild global hypokinesis.  IV heparin started once INR < 2. Cath done by [name] that showed no culprit lesions with stable anatomy as described above. Coumadin resumed post cath. Added Imdur 928m and [id number] - uptitrate as an OP if ongoing chest pain. Since he's had a CVA in the past will do a lovenox bridge. Coumadin 2.28m49mri, sat and sun and [facility] [facility] [place]. BEMT during TCM. Not on ASA due to need to anticoagulation, can be restart if ok with primary cardiology.   The patient has been seen by Dr. BerGwenlyn Foundoday and deemed ready for discharge home. All follow-up appointments have been scheduled. Discharge medications are listed below.   He has follow up on 10-29-2015 with coumadin clinic and 11-05-2015 with cardiology.  Pt has been following coumadin  Plan on dc summary. But will call in am to see coumadin clinic.  Pt states chest pain is now much less and less frequent. In fact no pain at all today. No sob or wheezing. No new neruologic type signs or symptoms reported.  Pt mentioned that on Saturday felt tired and sluggish. He had some constipation for a couple of days preceding. Wife gave him miralax on Sunday. He had  2-3 some loose stools on Monday. Pt since Tuesday about one loose watery stool. Wed and Thursday one loose water stool a day as well. Pt has been feeling tired since Monday.     Past Medical History:  Diagnosis Date  . CAD (coronary artery disease)    s/p CABG, s/p DES to LCX  January 2011  . Chronic pain    left sided-[place]  . CVA (cerebral infarction)    left hemiparesis  . Diastolic CHF ([facility] . Dysrhythmia    hx of atrial fibrilation with cardioversion  . GERD (gastroesophageal reflux disease)   . Gout   . Hyperlipidemia   . Hypertension   . Hypothyroidism   . OSA on CPAP   . Sleep apnea   . [place] . Tubular adenoma of colon 05/2010     Social History   Social History  . Marital status: Married      Spouse name: [name] Number of children: N/A  . Years of education: N/A   Occupational History  . 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 Smoking status: Former [employer] scientist (life sciences) Smokeless tobacco: Never Used  . Alcohol use No     Comment: 1 beer a month  . Drug use: No  . Sexual activity: No   Other Topics Concern  . Not on file   Social History Narrative   Pt lives with wife. Does have stairs, but patient doesn't use them. Pt has completed technical school    Past Surgical History:  Procedure  Laterality Date  . CARDIAC CATHETERIZATION N/A 10/25/2015   Procedure: Left Heart Cath and Cors/Grafts Angiography;  Surgeon: Troy Sine, MD;  Location: [facility] [facility];  Service: Cardiovascular;  Laterality: N/A;  . CARDIOVERSION  06/20/2011   Procedure: CARDIOVERSION;  Surgeon: Larey Dresser, MD;  Location: [facility] ENDOSCOPY;  Service: Cardiovascular;  Laterality: N/A;  . CARDIOVERSION N/A 04/26/2015   Procedure: CARDIOVERSION;  Surgeon: Dorothy Spark, MD;  Location: [facility] ENDOSCOPY;  Service: Cardiovascular;  Laterality: N/A;  . CARDIOVERSION N/A 05/10/2015   Procedure: CARDIOVERSION;  Surgeon: Larey Dresser, MD;  Location: [place];  Service: Cardiovascular;  Laterality: N/A;  . CATARACT EXTRACTION  05/2010   left eye  . CATARACT EXTRACTION  04/2010   right eye  . CORONARY ARTERY BYPASS GRAFT     stent  . ESOPHAGOGASTRODUODENOSCOPY  03-25-2005  . NASAL SEPTUM SURGERY    . PERCUTANEOUS PLACEMENT INTRAVASCULAR STENT CERVICAL CAROTID ARTERY     03-2009; using a drug-eluting platform of the circumflex cornoray artery with a 3.0 x 18 [employer] Promus drug-eluting platform post dilated to 3.75 with a noncompliant balloon.  . TEE WITHOUT CARDIOVERSION  06/20/2011   Procedure: TRANSESOPHAGEAL ECHOCARDIOGRAM (TEE);  Surgeon: Larey Dresser, MD;  Location: [facility] ENDOSCOPY;  Service: Cardiovascular;  Laterality: N/A;    Family History  Problem Relation Age  of Onset  . Lung cancer Father     deceased  . Stroke Mother     deceased-[place]ISTROKES    No Known Allergies  Current Outpatient Prescriptions on File Prior to Visit  Medication Sig Dispense Refill  . allopurinol (ZYLOPRIM) 100 MG tablet TAKE 2 TABLETS BY MOUTH DAILY. D/C PREVIOUS SCRIPTS FOR THIS MEDICATION 180 tablet 1  . amiodarone (PACERONE) 200 MG tablet Take 200 mg by mouth daily.    . benazepril (LOTENSIN) 10 MG tablet Take 1 tablet (10 mg total) by mouth 2 (two) times daily. (Patient taking differently: Take 10 mg by mouth every morning. ) 60 tablet 11  . enoxaparin (LOVENOX) 120 MG/0.8ML injection Inject 0.8 mLs (120 mg total) into the skin every 12 (twelve) hours. Starting tonight 6 Syringe 0  . isosorbide mononitrate (IMDUR) 30 MG 24 hr tablet Take 1 tablet (30 mg total) by mouth daily. 30 tablet 6  . levothyroxine (SYNTHROID, LEVOTHROID) 112 MCG tablet TAKE 1 TABLET (112 MCG TOTAL) BY MOUTH DAILY BEFORE BREAKFAST. 90 tablet 1  . metFORMIN (GLUCOPHAGE) 500 MG tablet TAKE 1 TABLET EVERY DAY WITH BREAKFAST (Patient taking differently: Take 500 mg by mouth 3 (three) times a week. TAKE 1 TABLET EVERY DAY WITH BREAKFAST) 90 tablet 1  . nitroGLYCERIN (NITROSTAT) 0.4 MG SL tablet Place 1 tablet (0.4 mg total) under the tongue every 5 (five) minutes as needed for chest pain. Up to 3 doses 10 tablet 2  . pantoprazole (PROTONIX) 40 MG tablet Take 1 tablet (40 mg total) by mouth daily. 90 tablet 1  . ranolazine (RANEXA) 500 MG 12 hr tablet Take 1 tablet (500 mg total) by mouth 2 (two) times daily. 60 tablet 3  . traMADol (ULTRAM) 50 MG tablet Take 1 tablet (50 mg total) by mouth 2 (two) times daily. 180 tablet 1  . traMADol (ULTRAM-ER) 200 MG 24 hr tablet TAKE 1 TABLET EVERY DAY WITH AM DOSE OF TRAMADOL 50 MG 90 tablet 1  . warfarin (COUMADIN) 2.5 MG tablet Take warfarin 2.72m tonight, Saturday and Sunday and recheck INR in clinic Monday 30 tablet 0  . atorvastatin (LIPITOR) 40  MG tablet Take  two tablets ( 80 mg total ) by mouth daily    . fluticasone (FLONASE) 50 MCG/ACT nasal spray Place 2 sprays into both nostrils daily. (Patient not taking: Reported on 11/01/2015) 9.9 g 1  . furosemide (LASIX) 40 MG tablet Take 1 tablet (40 mg total) by mouth every other day. (Patient not taking: Reported on 11/01/2015) 45 tablet 1  . Multiple Vitamins-Minerals (MULTIVITAMIN GUMMIES MENS PO) Take 1 tablet by mouth daily.    . vitamin B-12 1000 MCG tablet Take 1 tablet (1,000 mcg total) by mouth daily. (Patient not taking: Reported on 11/01/2015) 30 tablet 0   No current facility-administered medications on file prior to visit.     BP 102/60   Pulse 78   Temp 98 F (36.7 C) (Oral)   Ht 6' 3"  (1.905 m)   SpO2 95%      Review of Systems  Constitutional: Positive for fatigue. Negative for chills and fever.  Respiratory: Negative for cough, chest tightness, shortness of breath and wheezing.   Cardiovascular: Negative for chest pain, palpitations and leg swelling.  Gastrointestinal: Positive for diarrhea. Negative for abdominal pain, nausea and vomiting.       But preceded by constipation. But now just loose watery stool for one day.  Endocrine: Negative for polydipsia, polyphagia and polyuria.  Genitourinary: Negative for flank pain and frequency.  Musculoskeletal: Negative for back pain.  Skin: Negative for rash.  Neurological: Negative for dizziness and headaches.  Hematological: Negative for adenopathy. Does not bruise/bleed easily.  Psychiatric/Behavioral: Negative for behavioral problems and confusion.       Objective:   Physical Exam  General Mental Status- Alert. General Appearance- Not in acute distress.   Skin General: Color- Normal Color. Moisture- Normal Moisture.  Neck Carotid Arteries- Normal color. Moisture- Normal Moisture. No carotid bruits. No JVD.  Chest and Lung Exam Auscultation: Breath Sounds:-Normal. CTA.  Cardiovascular Auscultation:Rythm-  Regular.(presently) Murmurs & Other Heart Sounds:Auscultation of the heart reveals- No Murmurs.  Abdomen Inspection:-Inspeection Normal. Palpation/Percussion:Note:No mass. Palpation and Percussion of the abdomen reveal- Non Tender, Non Distended + BS, no rebound or guarding.    Neurologic Cranial Nerve exam:- CN III-XII intact(No nystagmus), symmetric smile.  Lower ext- no pedal edema. Neg homans sign.       Assessment & Plan:  For fatigue will get cbc and cmp.  For your loose stools preceded by constipation will get abd xray today and if loose stools worsen then will advise doing stool panel studies. Currently recommend bland diet foods.  For recent hospitalization for angina continue imdur 30 mg a day and Ranexa 500 mg bid.   For your afib history we scheduled your coumadin clinic visit for tomorrow.  Follow up with cardiologist on Monday.  If you have any worsening sign or symptoms over weekend then ED evaluation.   [name], [name] Miller, PA-C

## 2015-11-01 NOTE — Progress Notes (Signed)
Pre visit review using our clinic tool,if applicable. No additional management support is needed unless otherwise documented below in the visit note.  

## 2015-11-01 NOTE — Patient Instructions (Addendum)
For fatigue will get cbc and cmp.  For your loose stools preceded by constipation will get abd xray today and if loose stools worsen then will advise doing stool panel studies. Currently recommend bland diet foods.  For recent hospitalization for angina continue imdur 30 mg a day and Ranexa 500 mg bid.   For your afib history we scheduled your coumadin clinic visit for tomorrow.  Follow up with cardiologist on Monday.(with [place] as regularly scheduled or as needed)  If you have any worsening sign or symptoms over weekend then ED evaluation.

## 2015-11-02 ENCOUNTER — Encounter: Payer: Self-pay | Admitting: Physician Assistant

## 2015-11-02 ENCOUNTER — Ambulatory Visit (INDEPENDENT_AMBULATORY_CARE_PROVIDER_SITE_OTHER): Payer: Medicare Other | Admitting: Pharmacist

## 2015-11-02 DIAGNOSIS — Z7901 Long term (current) use of anticoagulants: Secondary | ICD-10-CM | POA: Diagnosis not present

## 2015-11-02 DIAGNOSIS — I4891 Unspecified atrial fibrillation: Secondary | ICD-10-CM | POA: Diagnosis not present

## 2015-11-02 LAB — POCT INR: INR: 3

## 2015-11-04 DIAGNOSIS — I25118 Atherosclerotic heart disease of native coronary artery with other forms of angina pectoris: Secondary | ICD-10-CM | POA: Insufficient documentation

## 2015-11-04 HISTORY — DX: Atherosclerotic heart disease of native coronary artery with other forms of angina pectoris: I25.118

## 2015-11-04 NOTE — Progress Notes (Signed)
Cardiology Office Note:    Date:  11/05/2015   ID:  [name], DOB [birth year], MRN [id number]  PCP:  [name]., NP  Cardiologist:  Dr. [name]   Electrophysiologist:  n/a  Referring MD: Debbrah Alar, NP   Chief Complaint  Patient presents with  . Hospitalization Follow-up    chest pain; s/p LHC    History of Present Illness:    [name] is a 70 y.o. male with a hx of CAD s/p CABG, PAF, prior CVA.  Myoview in 2013 was neg for ischemia.  He has residual gait difficulty and L hemiparesis 2/2 to prior CVA.       Admitted in 2/17 from AFib Clinic with AF with RVR. He had undergone DCCV earlier in 2/17 for AF with RVR.  HR was in 150s.  He was placed on Amiodarone gtt.  He underwent DCCV with return of NSR and was DC home on Amiodarone.  His beta-blocker was ultimately DC'd due to bradycardia.    Admitted 8/7-8/11 chest pain and dyspnea. Cardiac enzymes remained negative. Inpatient nuclear stress test was intermediate risk with small prior apical infarct versus thinning with mild apical ischemia and mild to moderate ischemia in the inferolateral wall, EF 49%. Cardiac catheterization demonstrated stable anatomy. He was placed on nitrates and Ranexa. He was placed back on Coumadin prior to discharge.  He returns for follow-up. He has multiple complaints today. After discharge, he developed gastroenteritis with nausea, vomiting and diarrhea. He also had fever. Symptoms have resolved. He is now constipated. He denies recurrent chest pain. However, he notes significant issues with his gait and problems with balance. He denies syncope or near-syncope. He denies any spinning sensation. He initially tells me that he has not had any significant dyspnea. Later in interview he tells me he's had significant dyspnea since discharge from the hospital. He denies orthopnea or PND. LE edema is improved. He also seems confused about when he was in the hospital. He is  convinced he was in the hospital twice this month. However, I explained to him he has only one admission recorded  Prior CV studies that were reviewed today include:    LHC 10/25/15 LAD ostial 90%, D1 occluded LCx ostial 95%, mid stent patent with 30% ISR RCA small, nondominant-never visualized LIMA-LAD patent, beyond graft insertion 95% apical LAD SVG-D1-Y graft with superior limb anastomosed to D1 and inferior limb anastomosed to OM2 patent SVG-PDA known to be occluded Medical Rx  Myoview 10/23/15 Intermediate stress nuclear study with small prior apical infarct vs thinning with mild apical ischemia and mild to moderate ischemia in the inferolateral wall; EF 49 with mild global hypokinesis.  Echo 2/17 Mild LVH, EF 50-55%, no RWMA, normal diastolic function, mild LAE, mod TR  Carotid US 5/15 bilat ICA 1-39%  Myoview 8/13 Overall Impression: Normal stress nuclear study with a small, mild, fixed inferior defect consistent with inferior thinning but no ischemia.  LV Ejection Fraction: 62%. LV Wall Motion: NL LV Function; NL Wall Motion  LHC 1/11 LM dist 40% LAD ostial 40%, mid 60-70%, 40% LCx occluded OM2, LCx stent patent with 40% ISR, mid AV groove 90% RCA prox 30-40%, mid 30% L-LAD patent S-Dx/OM2 patent S-PDA occluded EF 60% PCI 3 x 18 mm Promus DES to mid LCx  Past Medical History:  Diagnosis Date  . CAD (coronary artery disease)    s/p CABG, s/p DES to LCX  January 2011  . Chronic pain    left  sided-Kristeins  . CVA (cerebral infarction)    left hemiparesis  . Diastolic CHF ([place])   . Dysrhythmia    hx of atrial fibrilation with cardioversion  . GERD (gastroesophageal reflux disease)   . Gout   . Hyperlipidemia   . Hypertension   . Hypothyroidism   . OSA on CPAP   . Sleep apnea   . Stroke ([place])   . Tubular adenoma of colon 05/2010  1. GERD 2. Gout 3. Hypothyroidism 4. HTN 5. H/o CVA: some residual left-sided weakness.  6. Borderline DM2 7. CAD s/p  CABG in 2004. LHC in 2011: SVG-RCA and SVG-OM totally occluded. LIMA-LAD patent. Patient had DES to CFX, flow down native RCA was ok. Lexiscan myoview (8/13): EF 62%, small mild fixed inferior defect with no ischemia. Echo (9/14) with EF 55-60%, grade II diastolic dysfunction, PA systolic pressure 35 mmHg.  8. OSA: Intolerant of CPAP, wears oxygen at night.  9. Paroxysmal atrial fibrillation. TEE (4/13) with EF 55-60%, no significant valvular abnormalities.  10. Carotid dopplers (5/15) with minimal stenosis  Past Surgical History:  Procedure Laterality Date  . CARDIAC CATHETERIZATION N/A 10/25/2015   Procedure: Left Heart Cath and Cors/Grafts Angiography;  Surgeon: Troy Sine, MD;  Location: [place] CV LAB;  Service: Cardiovascular;  Laterality: N/A;  . CARDIOVERSION  06/20/2011   Procedure: CARDIOVERSION;  Surgeon: Larey Dresser, MD;  Location: [facility] ENDOSCOPY;  Service: Cardiovascular;  Laterality: N/A;  . CARDIOVERSION N/A 04/26/2015   Procedure: CARDIOVERSION;  Surgeon: Dorothy Spark, MD;  Location: [facility] ENDOSCOPY;  Service: Cardiovascular;  Laterality: N/A;  . CARDIOVERSION N/A 05/10/2015   Procedure: CARDIOVERSION;  Surgeon: Larey Dresser, MD;  Location: [place];  Service: Cardiovascular;  Laterality: N/A;  . CATARACT EXTRACTION  05/2010   left eye  . CATARACT EXTRACTION  04/2010   right eye  . CORONARY ARTERY BYPASS GRAFT     stent  . ESOPHAGOGASTRODUODENOSCOPY  03-25-2005  . NASAL SEPTUM SURGERY    . PERCUTANEOUS PLACEMENT INTRAVASCULAR STENT CERVICAL CAROTID ARTERY     03-2009; using a drug-eluting platform of the circumflex cornoray artery with a 3.0 x 18 [employer] Promus drug-eluting platform post dilated to 3.75 with a noncompliant balloon.  . TEE WITHOUT CARDIOVERSION  06/20/2011   Procedure: TRANSESOPHAGEAL ECHOCARDIOGRAM (TEE);  Surgeon: Larey Dresser, MD;  Location: [facility] ENDOSCOPY;  Service: Cardiovascular;  Laterality: N/A;    Current  Medications: Outpatient Medications Prior to Visit  Medication Sig Dispense Refill  . allopurinol (ZYLOPRIM) 100 MG tablet TAKE 2 TABLETS BY MOUTH DAILY. D/C PREVIOUS SCRIPTS FOR THIS MEDICATION 180 tablet 1  . atorvastatin (LIPITOR) 40 MG tablet Take two tablets ( 80 mg total ) by mouth daily    . enoxaparin (LOVENOX) 120 MG/0.8ML injection Inject 0.8 mLs (120 mg total) into the skin every 12 (twelve) hours. Starting tonight 6 Syringe 0  . fluticasone (FLONASE) 50 MCG/ACT nasal spray Place 2 sprays into both nostrils daily. 9.9 g 1  . furosemide (LASIX) 40 MG tablet Take 1 tablet (40 mg total) by mouth every other day. 45 tablet 1  . levothyroxine (SYNTHROID, LEVOTHROID) 112 MCG tablet TAKE 1 TABLET (112 MCG TOTAL) BY MOUTH DAILY BEFORE BREAKFAST. 90 tablet 1  . Multiple Vitamins-Minerals (MULTIVITAMIN GUMMIES MENS PO) Take 1 tablet by mouth daily.    . nitroGLYCERIN (NITROSTAT) 0.4 MG SL tablet Place 1 tablet (0.4 mg total) under the tongue every 5 (five) minutes as needed for chest pain. Up to 3  doses 10 tablet 2  . pantoprazole (PROTONIX) 40 MG tablet Take 1 tablet (40 mg total) by mouth daily. 90 tablet 1  . traMADol (ULTRAM) 50 MG tablet Take 1 tablet (50 mg total) by mouth 2 (two) times daily. 180 tablet 1  . traMADol (ULTRAM-ER) 200 MG 24 hr tablet TAKE 1 TABLET EVERY DAY WITH AM DOSE OF TRAMADOL 50 MG 90 tablet 1  . vitamin B-12 1000 MCG tablet Take 1 tablet (1,000 mcg total) by mouth daily. 30 tablet 0  . warfarin (COUMADIN) 2.5 MG tablet Take warfarin 2.35m tonight, Saturday and Sunday and recheck INR in clinic Monday 30 tablet 0  . amiodarone (PACERONE) 200 MG tablet Take 200 mg by mouth daily.    . isosorbide mononitrate (IMDUR) 30 MG 24 hr tablet Take 1 tablet (30 mg total) by mouth daily. 30 tablet 6  . ranolazine (RANEXA) 500 MG 12 hr tablet Take 1 tablet (500 mg total) by mouth 2 (two) times daily. 60 tablet 3  . benazepril (LOTENSIN) 10 MG tablet Take 1 tablet (10 mg total) by  mouth 2 (two) times daily. (Patient not taking: Reported on 11/05/2015) 60 tablet 11  . metFORMIN (GLUCOPHAGE) 500 MG tablet TAKE 1 TABLET EVERY DAY WITH BREAKFAST (Patient not taking: Reported on 11/05/2015) 90 tablet 1   No facility-administered medications prior to visit.       Allergies:   Review of patient's allergies indicates no known allergies.   Social History   Social History  . Marital status: Married    Spouse name: GPeter [name] . Number of children: N/A  . Years of education: N/A   Occupational History  . retired OHydrographic surveyor  Social History Main Topics  . Smoking status: Former SResearch scientist (life sciences) . Smokeless tobacco: Never Used  . Alcohol use No     Comment: 1 beer a month  . Drug use: No  . Sexual activity: No   Other Topics Concern  . None   Social History Narrative   Pt lives with wife. Does have stairs, but patient doesn't use them. Pt has completed technical school     Family History:  The patient's family history includes Lung cancer in his father; Stroke in his mother.   ROS:   Please see the history of present illness.    Review of Systems  Constitution: Positive for decreased appetite, diaphoresis, fever and malaise/fatigue.  HENT: Positive for headaches.   Cardiovascular: Positive for chest pain, dyspnea on exertion and irregular heartbeat.  Hematologic/Lymphatic: Bruises/bleeds easily.  Musculoskeletal: Positive for myalgias.  Gastrointestinal: Positive for abdominal pain, constipation and nausea.  Neurological: Positive for dizziness.  Psychiatric/Behavioral: The patient is nervous/anxious.    All other systems reviewed and are negative.   EKGs/Labs/Other Test Reviewed:    EKG:  EKG is   ordered today.  The ekg ordered today demonstrates NSR, HR 62, LAD, nonspecific ST-T wave changes, QTc 418 ms  Recent Labs: 05/09/2015: B Natriuretic Peptide 171.3 10/22/2015: Magnesium 2.1; TSH 0.768 11/01/2015: ALT 130; BUN 27; Creat 1.40; Hemoglobin 13.4;  Platelets 162; Potassium 4.1; Sodium 137   Recent Lipid Panel    Component Value Date/Time   CHOL 142 10/23/2015 0519   TRIG 127 10/23/2015 0519   HDL 42 10/23/2015 0519   CHOLHDL 3.4 10/23/2015 0519   VLDL 25 10/23/2015 0519   LDLCALC 75 10/23/2015 0519     Physical Exam:    VS:  BP 134/72   Pulse 62  Ht 6' 3"  (1.905 m)   Wt 252 lb (114.3 kg)   BMI 31.50 kg/m     Wt Readings from Last 3 Encounters:  11/05/15 252 lb (114.3 kg)  10/26/15 251 lb 9.6 oz (114.1 kg)  08/31/15 256 lb (116.1 kg)     Physical Exam  Constitutional: He is oriented to person, place, and time. He appears well-developed and well-nourished. He appears distressed.  HENT:  Head: Normocephalic and atraumatic.  Eyes: No scleral icterus.  Neck: Normal range of motion. No JVD present.  Cardiovascular: Normal rate, regular rhythm and normal heart sounds.   No murmur heard. Pulmonary/Chest: Effort normal. He has no wheezes. He has no rales.  Abdominal: Soft. There is no tenderness.  Musculoskeletal: He exhibits no edema.  R groin without hematoma or bruit  Neurological: He is alert and oriented to person, place, and time.  Skin: Skin is warm and dry.  Psychiatric: He has a normal mood and affect.    ASSESSMENT:    1. Shortness of breath   2. Coronary artery disease involving native coronary artery of native heart with angina pectoris ([place])   3. Chronic diastolic CHF (congestive heart failure) (HCC)   4. Paroxysmal atrial fibrillation ([place])   5. Essential hypertension   6. 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 Dyspnea - Patient actually has multiple complaints today. Question if these are all related to recent medication changes. He does not appear to be volume overloaded on exam. Recent cardiac catheterization demonstrated fairly stable anatomy and no targets for PCI. Recent chest x-ray was unremarkable. In review of his chart, I do not see where  he has had baseline PFTs since starting on amiodarone. However, his symptoms do not seem to be consistent with pulmonary toxicity.  -  Obtain BMET, CBC, BNP  -  If symptoms continue at follow-up, consider ESR, PFTs  2. CAD - As noted, the patient recently had admission to the hospital with chest discomfort. He had an abnormal Myoview. Cardiac catheterization demonstrated stable anatomy no targets for PCI. Medical therapy was recommended. He is now on isosorbide as well as Ranexa. He's had significant issues since discharge from the hospital. He also notes headaches which are likely related isosorbide.  -  DC Ranexa  -  Decrease isosorbide 15 mg daily at bedtime  -  He is not on beta blocker secondary to significant bradycardia in the past   -  He is no longer on aspirin secondary to Coumadin therapy.  -  Continue high-dose statin.  3. Chronic diastolic CHF - He does note increasing dyspnea. However, he does not appear to be volume overloaded on exam. Check BNP today. If BNP significantly elevated, adjust Lasix.  4. PAF - He is maintaining normal sinus rhythm. However, he has significant issues with balance. Question ataxia. He may have neuropathy related to amiodarone as well. I'm sure that his symptoms are magnified given his left-sided hemiparesis. Decrease amiodarone to 100 mg daily. If symptoms continue, consider referral back to EP/A. fib clinic for further recommendations.  Recent TSH ok.  Recent LFTs elevated.  Will need repeat LFTs.  5. HTN  - Blood pressure controlled.  6. Hemiparesis related to old stroke - Question if this is magnifying some of his symptoms related to side effects from amiodarone.    Medication Adjustments/Labs and Tests Ordered: Current medicines are reviewed at length with the patient today.  Concerns regarding medicines are outlined above.  Medication changes, Labs and Tests ordered today are outlined in the Patient Instructions noted below. Patient Instructions   Medication Instructions:  1. STOP RANEXA 2. DECREASE AMIODARONE TO 100 MG DAILY; NEW PRESCRIPTION HAS BEEN SENT IN  3. DECREASE  IMDUR TO 15 MG DAILY : THIS WILL BE 1/2 TABLET DAILY; NEW PRESCRIPTION HAS BEEN SENT IN Labwork: TODAY BMET, CBC W/DIFF, BNP Testing/Procedures: NONE Follow-Up: DR. Aundra Dubin IN 1 MONTH  Any Other Special Instructions Will Be Listed Below (If Applicable). If you need a refill on your cardiac medications before your next appointment, please call your pharmacy.  Signed, [name], PA-C  11/05/2015 5:19 PM    [facility] [address], [place], [place]  [address] Phone: [phone]; Fax: [phone]

## 2015-11-05 ENCOUNTER — Ambulatory Visit (INDEPENDENT_AMBULATORY_CARE_PROVIDER_SITE_OTHER): Payer: Medicare Other | Admitting: Physician Assistant

## 2015-11-05 ENCOUNTER — Encounter: Payer: Self-pay | Admitting: Physician Assistant

## 2015-11-05 ENCOUNTER — Telehealth: Payer: Self-pay | Admitting: Physician Assistant

## 2015-11-05 VITALS — BP 134/72 | HR 62 | Ht 75.0 in | Wt 252.0 lb

## 2015-11-05 DIAGNOSIS — R0602 Shortness of breath: Secondary | ICD-10-CM

## 2015-11-05 DIAGNOSIS — I5032 Chronic diastolic (congestive) heart failure: Secondary | ICD-10-CM

## 2015-11-05 DIAGNOSIS — I25119 Atherosclerotic heart disease of native coronary artery with unspecified angina pectoris: Secondary | ICD-10-CM

## 2015-11-05 DIAGNOSIS — I69359 Hemiplegia and hemiparesis following cerebral infarction affecting unspecified side: Secondary | ICD-10-CM

## 2015-11-05 DIAGNOSIS — I480 Paroxysmal atrial fibrillation: Secondary | ICD-10-CM | POA: Diagnosis not present

## 2015-11-05 DIAGNOSIS — I10 Essential (primary) hypertension: Secondary | ICD-10-CM

## 2015-11-05 LAB — CBC WITH DIFFERENTIAL/PLATELET
BASOS ABS: 57 {cells}/uL (ref 0–200)
BASOS PCT: 1 %
EOS ABS: 114 {cells}/uL (ref 15–500)
EOS PCT: 2 %
HCT: 37.3 % — ABNORMAL LOW (ref 38.5–50.0)
HEMOGLOBIN: 12.6 g/dL — AB (ref 13.2–17.1)
LYMPHS ABS: 1995 {cells}/uL (ref 850–3900)
Lymphocytes Relative: 35 %
MCH: 30.1 pg (ref 27.0–33.0)
MCHC: 33.8 g/dL (ref 32.0–36.0)
MCV: 89.2 fL (ref 80.0–100.0)
MONOS PCT: 9 %
MPV: 11 fL (ref 7.5–12.5)
Monocytes Absolute: 513 cells/uL (ref 200–950)
NEUTROS ABS: 3021 {cells}/uL (ref 1500–7800)
Neutrophils Relative %: 53 %
PLATELETS: 196 10*3/uL (ref 140–400)
RBC: 4.18 MIL/uL — ABNORMAL LOW (ref 4.20–5.80)
RDW: 13.8 % (ref 11.0–15.0)
WBC: 5.7 10*3/uL (ref 3.8–10.8)

## 2015-11-05 LAB — BASIC METABOLIC PANEL
BUN: 14 mg/dL (ref 7–25)
CHLORIDE: 101 mmol/L (ref 98–110)
CO2: 30 mmol/L (ref 20–31)
CREATININE: 1.27 mg/dL — AB (ref 0.70–1.18)
Calcium: 8.9 mg/dL (ref 8.6–10.3)
Glucose, Bld: 93 mg/dL (ref 65–99)
Potassium: 4 mmol/L (ref 3.5–5.3)
SODIUM: 142 mmol/L (ref 135–146)

## 2015-11-05 MED ORDER — ISOSORBIDE MONONITRATE ER 30 MG PO TB24: 15.0000 mg | ORAL_TABLET | Freq: Every day | ORAL | 6 refills | Status: DC

## 2015-11-05 MED ORDER — AMIODARONE HCL 100 MG PO TABS: 100.0000 mg | ORAL_TABLET | Freq: Every day | ORAL | 6 refills | Status: DC

## 2015-11-05 NOTE — Patient Instructions (Addendum)
Medication Instructions:  1. STOP RANEXA 2. DECREASE AMIODARONE TO 100 MG DAILY; NEW PRESCRIPTION HAS BEEN SENT IN  3. DECREASE  IMDUR TO 15 MG DAILY : THIS WILL BE 1/2 TABLET DAILY; NEW PRESCRIPTION HAS BEEN SENT IN Labwork: TODAY BMET, CBC W/DIFF, BNP Testing/Procedures: NONE Follow-Up: DR. Aundra Dubin IN 1 MONTH  Any Other Special Instructions Will Be Listed Below (If Applicable). If you need a refill on your cardiac medications before your next appointment, please call your pharmacy.

## 2015-11-05 NOTE — Telephone Encounter (Signed)
After review the patient's chart, note LFTs elevated recently. He is on Amiodarone which can cause liver toxicity. Please have patient return in the next 1-2 weeks for repeat LFTs. [name], PA-C   11/05/2015 5:17 PM

## 2015-11-06 LAB — BRAIN NATRIURETIC PEPTIDE: BRAIN NATRIURETIC PEPTIDE: 82.2 pg/mL (ref <100)

## 2015-11-07 ENCOUNTER — Telehealth: Payer: Self-pay | Admitting: *Deleted

## 2015-11-07 DIAGNOSIS — I10 Essential (primary) hypertension: Secondary | ICD-10-CM

## 2015-11-07 DIAGNOSIS — I4891 Unspecified atrial fibrillation: Secondary | ICD-10-CM

## 2015-11-07 NOTE — Telephone Encounter (Signed)
Pt notified of lab results by phone with verbal understanding. Pt advised to repeat LFT due to labs on 8/17 show elevated LFT. Pt agreeable to repeat LFT 9/5 when he comes in for CVRR appt.

## 2015-11-11 ENCOUNTER — Other Ambulatory Visit: Payer: Self-pay | Admitting: Physician Assistant

## 2015-11-14 ENCOUNTER — Ambulatory Visit: Payer: Medicare Other | Admitting: Gastroenterology

## 2015-11-19 ENCOUNTER — Other Ambulatory Visit: Payer: Self-pay | Admitting: Family

## 2015-11-20 ENCOUNTER — Ambulatory Visit (INDEPENDENT_AMBULATORY_CARE_PROVIDER_SITE_OTHER): Payer: Medicare Other | Admitting: Pharmacist

## 2015-11-20 ENCOUNTER — Other Ambulatory Visit: Payer: Medicare Other | Admitting: *Deleted

## 2015-11-20 ENCOUNTER — Other Ambulatory Visit: Payer: Self-pay

## 2015-11-20 DIAGNOSIS — Z7901 Long term (current) use of anticoagulants: Secondary | ICD-10-CM

## 2015-11-20 DIAGNOSIS — I10 Essential (primary) hypertension: Secondary | ICD-10-CM

## 2015-11-20 DIAGNOSIS — I4891 Unspecified atrial fibrillation: Secondary | ICD-10-CM | POA: Diagnosis not present

## 2015-11-20 LAB — HEPATIC FUNCTION PANEL
ALBUMIN: 3.9 g/dL (ref 3.6–5.1)
ALK PHOS: 55 U/L (ref 40–115)
ALT: 61 U/L — AB (ref 9–46)
AST: 39 U/L — AB (ref 10–35)
Bilirubin, Direct: 0.1 mg/dL (ref <=0.2)
Indirect Bilirubin: 0.6 mg/dL (ref 0.2–1.2)
TOTAL PROTEIN: 6.7 g/dL (ref 6.1–8.1)
Total Bilirubin: 0.7 mg/dL (ref 0.2–1.2)

## 2015-11-20 LAB — POCT INR: INR: 3.1

## 2015-11-20 MED ORDER — PANTOPRAZOLE SODIUM 40 MG PO TBEC: 40.0000 mg | DELAYED_RELEASE_TABLET | Freq: Every day | ORAL | 1 refills | Status: DC

## 2015-12-04 ENCOUNTER — Telehealth: Payer: Self-pay | Admitting: Medical

## 2015-12-04 DIAGNOSIS — M5442 Lumbago with sciatica, left side: Secondary | ICD-10-CM

## 2015-12-04 DIAGNOSIS — M5410 Radiculopathy, site unspecified: Secondary | ICD-10-CM

## 2015-12-04 NOTE — Telephone Encounter (Signed)
This pt should be 30 minutes with me each time. If he gets roomed and is ready by 8:15 which may be the case. Looks like he will have some cardiovascular issue to review. So out if I get late start may be done with him by 8:45. Can you make sure 9:15 does not get filled. Please.

## 2015-12-05 ENCOUNTER — Ambulatory Visit (HOSPITAL_BASED_OUTPATIENT_CLINIC_OR_DEPARTMENT_OTHER)
Admission: RE | Admit: 2015-12-05 | Discharge: 2015-12-05 | Disposition: A | Discharge status: Home / Self Care | Payer: Medicare Other | Source: Ambulatory Visit | Attending: Medical | Admitting: Medical

## 2015-12-05 ENCOUNTER — Encounter: Payer: Self-pay | Admitting: Medical

## 2015-12-05 ENCOUNTER — Ambulatory Visit (INDEPENDENT_AMBULATORY_CARE_PROVIDER_SITE_OTHER): Payer: Medicare Other | Admitting: Medical

## 2015-12-05 VITALS — BP 136/68 | HR 75 | Temp 98.0°F | Ht 75.0 in | Wt 251.2 lb

## 2015-12-05 DIAGNOSIS — M47816 Spondylosis without myelopathy or radiculopathy, lumbar region: Secondary | ICD-10-CM | POA: Diagnosis not present

## 2015-12-05 DIAGNOSIS — M25552 Pain in left hip: Secondary | ICD-10-CM | POA: Diagnosis not present

## 2015-12-05 DIAGNOSIS — I208 Other forms of angina pectoris: Secondary | ICD-10-CM | POA: Diagnosis not present

## 2015-12-05 DIAGNOSIS — M5430 Sciatica, unspecified side: Secondary | ICD-10-CM | POA: Diagnosis not present

## 2015-12-05 DIAGNOSIS — M5442 Lumbago with sciatica, left side: Secondary | ICD-10-CM | POA: Diagnosis not present

## 2015-12-05 DIAGNOSIS — M792 Neuralgia and neuritis, unspecified: Secondary | ICD-10-CM | POA: Diagnosis not present

## 2015-12-05 DIAGNOSIS — M4316 Spondylolisthesis, lumbar region: Secondary | ICD-10-CM | POA: Diagnosis not present

## 2015-12-05 DIAGNOSIS — M47896 Other spondylosis, lumbar region: Secondary | ICD-10-CM | POA: Diagnosis not present

## 2015-12-05 DIAGNOSIS — M5410 Radiculopathy, site unspecified: Secondary | ICD-10-CM

## 2015-12-05 MED ORDER — TIZANIDINE HCL 2 MG PO CAPS: ORAL_CAPSULE | ORAL | 0 refills | Status: DC

## 2015-12-05 MED ORDER — PREDNISONE 10 MG PO TABS: ORAL_TABLET | ORAL | 0 refills | Status: DC

## 2015-12-05 NOTE — Telephone Encounter (Signed)
Visit modifiers have been applied to patients account for you visits

## 2015-12-05 NOTE — Progress Notes (Signed)
Subjective:    Patient ID: [name], male    DOB: March 13, 1946, 70 y.o.   MRN: [id number]  HPI  Pt in states lower back area pain 3-4 days ago. Pain running down back toward lt lower ext toward his foot. Pain is worse on walking. But also when he changes position sitting to standing and standing to sitting will have pain. No fall or injury. No exercise. He states only riding a lawn more at times at home. But only cutting grass every 6 days.  No foot drop, no saddle anesthesia and no new leg weakness(hx of prior stroke).  Some hip pain.  Pain on review worse when standing and extending legs. States some si area pain.  Pt sees Dr. Read Drivers and has been given med for pain in left upper and left lower ext. Pt is on tramadol.   Pt not reporting any cardiac or respiratory symptoms.  Pt states prediabetic. GFR greater than 60  On review of chart 3 years ago looks like he used medrol pack.     Review of Systems  Constitutional: Negative for chills and fatigue.  Respiratory: Negative for cough, chest tightness, shortness of breath and wheezing.   Cardiovascular: Negative for chest pain and palpitations.  Gastrointestinal: Negative for abdominal pain.  Musculoskeletal: Positive for back pain. Negative for neck pain and neck stiffness.       Lt si pain and radiating pain.  Some left hip pain.  Skin: Negative for rash.  Psychiatric/Behavioral: Negative for behavioral problems and confusion.    Past Medical History:  Diagnosis Date  . CAD (coronary artery disease)    s/p CABG, s/p DES to LCX  January 2011  . Chronic pain    left sided-Kristeins  . CVA (cerebral infarction)    left hemiparesis  . Diastolic CHF ([place])   . Dysrhythmia    hx of atrial fibrilation with cardioversion  . GERD (gastroesophageal reflux disease)   . Gout   . Hyperlipidemia   . Hypertension   . Hypothyroidism   . OSA on CPAP   . Sleep apnea   . Stroke ([place])   . Tubular adenoma of colon 05/2010      Social History   Social History  . Marital status: Married    Spouse name: [name]  . Number of children: N/A  . Years of education: N/A   Occupational History  . retired Hydrographic surveyor   Social History Main Topics  . Smoking status: Former Research scientist (life sciences)  . Smokeless tobacco: Never Used  . Alcohol use No     Comment: 1 beer a month  . Drug use: No  . Sexual activity: No   Other Topics Concern  . Not on file   Social History Narrative   Pt lives with wife. Does have stairs, but patient doesn't use them. Pt has completed technical school    Past Surgical History:  Procedure Laterality Date  . CARDIAC CATHETERIZATION N/A 10/25/2015   Procedure: Left Heart Cath and Cors/Grafts Angiography;  Surgeon: Troy Sine, MD;  Location: [place] CV LAB;  Service: Cardiovascular;  Laterality: N/A;  . CARDIOVERSION  06/20/2011   Procedure: CARDIOVERSION;  Surgeon: Larey Dresser, MD;  [facility]COPY;  Service: Cardiovascular;  Laterality: N/A;  . CARDIOVERSION N/A 04/26/2015   Procedure: CARDIOVERSION;  Surgeon: Dorothy Spark, MD;  Location: [facility] ENDOSCOPY;  Service: Cardiovascular;  Laterality: N/A;  . CARDIOVERSION N/A 05/10/2015   Procedure: CARDIOVERSION;  Surgeon: Larey Dresser, MD;  Location: [place];  Service: Cardiovascular;  Laterality: N/A;  . CATARACT EXTRACTION  05/2010   left eye  . CATARACT EXTRACTION  04/2010   right eye  . CORONARY ARTERY BYPASS GRAFT     stent  . ESOPHAGOGASTRODUODENOSCOPY  03-25-2005  . NASAL SEPTUM SURGERY    . PERCUTANEOUS PLACEMENT INTRAVASCULAR STENT CERVICAL CAROTID ARTERY     03-2009; using a drug-eluting platform of the circumflex cornoray artery with a 3.0 x 18 [employer] Promus drug-eluting platform post dilated to 3.75 with a noncompliant balloon.  . TEE WITHOUT CARDIOVERSION  06/20/2011   Procedure: TRANSESOPHAGEAL ECHOCARDIOGRAM (TEE);  Surgeon: Larey Dresser, MD;  Location: [facility] ENDOSCOPY;  Service:  Cardiovascular;  Laterality: N/A;    Family History  Problem Relation Age of Onset  . Lung cancer Father     deceased  . Stroke Mother     deceased-MINISTROKES    No Known Allergies  Current Outpatient Prescriptions on File Prior to Visit  Medication Sig Dispense Refill  . allopurinol (ZYLOPRIM) 100 MG tablet TAKE 2 TABLETS BY MOUTH DAILY. D/C PREVIOUS SCRIPTS FOR THIS MEDICATION 180 tablet 1  . amiodarone (PACERONE) 100 MG tablet Take 1 tablet (100 mg total) by mouth daily. 30 tablet 6  . atorvastatin (LIPITOR) 40 MG tablet Take two tablets ( 80 mg total ) by mouth daily    . benazepril (LOTENSIN) 10 MG tablet Take 10 mg by mouth daily.    . fluticasone (FLONASE) 50 MCG/ACT nasal spray Place 2 sprays into both nostrils daily. 9.9 g 1  . furosemide (LASIX) 40 MG tablet Take 1 tablet (40 mg total) by mouth every other day. 45 tablet 1  . isosorbide mononitrate (IMDUR) 30 MG 24 hr tablet Take 0.5 tablets (15 mg total) by mouth daily. 30 tablet 6  . levothyroxine (SYNTHROID, LEVOTHROID) 112 MCG tablet TAKE 1 TABLET (112 MCG TOTAL) BY MOUTH DAILY BEFORE BREAKFAST. 90 tablet 1  . metFORMIN (GLUCOPHAGE) 500 MG tablet Take by mouth daily with breakfast.    . Multiple Vitamins-Minerals (MULTIVITAMIN GUMMIES MENS PO) Take 1 tablet by mouth daily.    . nitroGLYCERIN (NITROSTAT) 0.4 MG SL tablet Place 1 tablet (0.4 mg total) under the tongue every 5 (five) minutes as needed for chest pain. Up to 3 doses 10 tablet 2  . pantoprazole (PROTONIX) 40 MG tablet Take 1 tablet (40 mg total) by mouth daily. 90 tablet 1  . traMADol (ULTRAM) 50 MG tablet Take 1 tablet (50 mg total) by mouth 2 (two) times daily. 180 tablet 1  . traMADol (ULTRAM-ER) 200 MG 24 hr tablet TAKE 1 TABLET EVERY DAY WITH AM DOSE OF TRAMADOL 50 MG 90 tablet 1  . vitamin B-12 1000 MCG tablet Take 1 tablet (1,000 mcg total) by mouth daily. 30 tablet 0  . warfarin (COUMADIN) 2.5 MG tablet Take warfarin 2.5mg  tonight, Saturday and Sunday  and recheck INR in clinic Monday 30 tablet 0   No current facility-administered medications on file prior to visit.     BP 136/68   Pulse 75   Temp 98 F (36.7 C) (Oral)   Ht 6\' 3"  (1.905 m)   Wt 251 lb 3.2 oz (113.9 kg)   SpO2 96%   BMI 31.40 kg/m       Objective:   Physical Exam  General Appearance- Not in acute distress.    Chest and Lung Exam Auscultation: Breath sounds:-Normal. Clear even and unlabored. Adventitious sounds:- No Adventitious  sounds.  Cardiovascular Auscultation:Rythm - Regular, rate and rythm. Heart Sounds -Normal heart sounds.  Abdomen Inspection:-Inspection Normal.  Palpation/Perucssion: Palpation and Percussion of the abdomen reveal- Non Tender, No Rebound tenderness, No rigidity(Guarding) and No Palpable abdominal masses.  Liver:-Normal.  Spleen:- Normal.   Back Mid lumbar spine no  tenderness to palpation. But some on movment. Pain on straight leg lift. Pain lt si area when he gets up from seated position.  Lower ext neurologic  L5-S1 sensation intact bilaterally. Normal patellar reflexes bilaterally. No foot drop bilaterally. Plantar flexion of left foot decreased strength compared to rt but history of stroke.     Assessment & Plan:  For your back pain, sciatica and hip pain I wrote zanaflex low dose to use only at night as needed muscle spasms. Med can make you drowsy/effect balance. So please be careful ambulating to bathroom if you get up at night.  I also wrote you for low dose tapered prednisone. You have tramadol and continue that for pain as well.  Please get xray of lumbar spine and left hip today.  Follow up in 7 days or as needed  Keagen Heinlen, Percell Miller, [employer]

## 2015-12-05 NOTE — Patient Instructions (Addendum)
For your back pain, sciatica and hip pain I wrote zanaflex low dose to use only at night as needed muscle spasms. Med can make you drowsy/effect balance. So please be careful ambulating to bathroom if you get up at night.  I also wrote you for low dose tapered prednisone. You have tramadol and continue that for pain as well.  Please get xray of lumbar spine and left hip today.  Follow up in 7 days or as needed

## 2015-12-06 NOTE — Telephone Encounter (Signed)
Talked with pt. Confirmed per his report no pacemaker, implanted device and no sedation needed before mri.

## 2015-12-08 ENCOUNTER — Ambulatory Visit (HOSPITAL_BASED_OUTPATIENT_CLINIC_OR_DEPARTMENT_OTHER)
Admission: RE | Admit: 2015-12-08 | Discharge: 2015-12-08 | Disposition: A | Discharge status: Home / Self Care | Payer: Medicare Other | Source: Ambulatory Visit | Attending: Medical | Admitting: Medical

## 2015-12-08 DIAGNOSIS — M5442 Lumbago with sciatica, left side: Secondary | ICD-10-CM | POA: Diagnosis not present

## 2015-12-08 DIAGNOSIS — M4856XA Collapsed vertebra, not elsewhere classified, lumbar region, initial encounter for fracture: Secondary | ICD-10-CM | POA: Insufficient documentation

## 2015-12-08 DIAGNOSIS — M47896 Other spondylosis, lumbar region: Secondary | ICD-10-CM | POA: Insufficient documentation

## 2015-12-08 DIAGNOSIS — M1288 Other specific arthropathies, not elsewhere classified, other specified site: Secondary | ICD-10-CM | POA: Insufficient documentation

## 2015-12-08 DIAGNOSIS — M4806 Spinal stenosis, lumbar region: Secondary | ICD-10-CM | POA: Diagnosis not present

## 2015-12-08 DIAGNOSIS — M792 Neuralgia and neuritis, unspecified: Secondary | ICD-10-CM | POA: Diagnosis not present

## 2015-12-08 DIAGNOSIS — M5410 Radiculopathy, site unspecified: Secondary | ICD-10-CM

## 2015-12-08 NOTE — Telephone Encounter (Signed)
Referral placed for neurosurgeon evaluation.

## 2015-12-09 ENCOUNTER — Other Ambulatory Visit: Payer: Self-pay | Admitting: Cardiology

## 2015-12-10 ENCOUNTER — Telehealth: Payer: Self-pay | Admitting: Family

## 2015-12-10 ENCOUNTER — Encounter (HOSPITAL_COMMUNITY): Payer: Self-pay | Admitting: Emergency Medicine

## 2015-12-10 ENCOUNTER — Emergency Department (HOSPITAL_COMMUNITY)
Admission: EM | Admit: 2015-12-10 | Discharge: 2015-12-10 | Disposition: A | Discharge status: Home / Self Care | Payer: Medicare Other | Attending: Emergency Medicine | Admitting: Emergency Medicine

## 2015-12-10 DIAGNOSIS — Z7984 Long term (current) use of oral hypoglycemic drugs: Secondary | ICD-10-CM | POA: Insufficient documentation

## 2015-12-10 DIAGNOSIS — E119 Type 2 diabetes mellitus without complications: Secondary | ICD-10-CM | POA: Diagnosis not present

## 2015-12-10 DIAGNOSIS — G8929 Other chronic pain: Secondary | ICD-10-CM | POA: Diagnosis not present

## 2015-12-10 DIAGNOSIS — I5030 Unspecified diastolic (congestive) heart failure: Secondary | ICD-10-CM | POA: Diagnosis not present

## 2015-12-10 DIAGNOSIS — Z7951 Long term (current) use of inhaled steroids: Secondary | ICD-10-CM | POA: Diagnosis not present

## 2015-12-10 DIAGNOSIS — M545 Low back pain: Secondary | ICD-10-CM | POA: Insufficient documentation

## 2015-12-10 DIAGNOSIS — Z79899 Other long term (current) drug therapy: Secondary | ICD-10-CM | POA: Diagnosis not present

## 2015-12-10 DIAGNOSIS — M5489 Other dorsalgia: Secondary | ICD-10-CM | POA: Diagnosis not present

## 2015-12-10 DIAGNOSIS — T148 Other injury of unspecified body region: Secondary | ICD-10-CM | POA: Diagnosis not present

## 2015-12-10 DIAGNOSIS — I2510 Atherosclerotic heart disease of native coronary artery without angina pectoris: Secondary | ICD-10-CM | POA: Insufficient documentation

## 2015-12-10 DIAGNOSIS — M25552 Pain in left hip: Secondary | ICD-10-CM | POA: Diagnosis not present

## 2015-12-10 DIAGNOSIS — Z87891 Personal history of nicotine dependence: Secondary | ICD-10-CM | POA: Insufficient documentation

## 2015-12-10 DIAGNOSIS — I110 Hypertensive heart disease with heart failure: Secondary | ICD-10-CM | POA: Diagnosis not present

## 2015-12-10 DIAGNOSIS — E039 Hypothyroidism, unspecified: Secondary | ICD-10-CM | POA: Insufficient documentation

## 2015-12-10 MED ORDER — CYCLOBENZAPRINE HCL 10 MG PO TABS
5.0000 mg | ORAL_TABLET | Freq: Once | ORAL | Status: AC
  Administered 2015-12-10: 5 mg via ORAL
  Filled 2015-12-10: qty 1

## 2015-12-10 MED ORDER — ACETAMINOPHEN 325 MG PO TABS
650.0000 mg | ORAL_TABLET | Freq: Once | ORAL | Status: AC
  Administered 2015-12-10: 650 mg via ORAL
  Filled 2015-12-10: qty 2

## 2015-12-10 MED ORDER — CYCLOBENZAPRINE HCL 10 MG PO TABS: 10.0000 mg | ORAL_TABLET | Freq: Two times a day (BID) | ORAL | 0 refills | Status: DC | PRN

## 2015-12-10 NOTE — Telephone Encounter (Signed)
Caller name: Relationship to patient: Self Can be reached: [phone] Pharmacy:  Reason for call: Request a call back about  The results of his MRI. States he is in sever pain and wants to know what to do now

## 2015-12-10 NOTE — ED Provider Notes (Signed)
[facility] DEPT Provider Note   CSN: [id number] Arrival date & time: 12/10/15  1426     History   Chief Complaint Chief Complaint  Patient presents with  . Hip Pain  . Back Pain  . Leg Pain  . Arm Pain    HPI [name] is a 70 y.o. male.  HPI   Patient is a 70 year old male with a history of CAD, chronic back and left hip pain, CHF, stroke on Coumadin who presents the emergency department with worsening low back pain, left hip pain, and left leg pain since this morning. Patient states yesterday he was in his garage and sitting on a stool roughly 18 inches high when he fell off of it onto his buttocks. He denies hitting his head or loss of consciousness. At the time he did not experience any increased pain. When he woke this morning he had nonradiating, 9/10 pain of his low back, left hip and left leg. He took his 200 mg ER tramadol and 50 mg IR tramadol without relief.Pt states he just finished a prednisone dose pack today. Patient states since his time in ED he is feeling better. He is concerned about not being able sleep tonight. He is asking for something else for pain. He saw his panic care provider 5 days ago who ordered plain films of lumbar spine and left hip and MRI of lower back. Patient denies new onset numbness/timing, weakness, urinary retention, loss of bowel bladder function, fever, belly pain, chest pain, shortness of breath.  Past Medical History:  Diagnosis Date  . CAD (coronary artery disease)    s/p CABG, s/p DES to LCX  January 2011  . Chronic pain    left sided-[place]  . CVA (cerebral infarction)    left hemiparesis  . Diastolic CHF ([place])   . Dysrhythmia    hx of atrial fibrilation with cardioversion  . GERD (gastroesophageal reflux disease)   . Gout   . Hyperlipidemia   . Hypertension   . Hypothyroidism   . OSA on CPAP   . Sleep apnea   . Stroke ([place])   . Tubular adenoma of colon 05/2010    Patient Active Problem List   Diagnosis  Date Noted  . Coronary artery disease involving native coronary artery with angina pectoris ([place]) 11/04/2015  . Angina at rest [facility]) 10/22/2015  . Atrial fibrillation ([place]) 05/09/2015  . Atrial fibrillation with rapid ventricular response ([place])   . CAD S/P CFX DES 2011 04/25/2015  . PAF - CAHDs VASc=7 04/25/2015  . Vasovagal syncope   . Chest pain 04/24/2015  . Constipation 04/24/2015  . Diastolic CHF -EF [id number] with grade 2 DD 2014 04/24/2015  . Dizziness and giddiness 08/16/2014  . B12 deficiency 08/11/2013  . Sensory disturbance 08/11/2013  . Left-sided weakness 08/10/2013  . TIA (transient ischemic attack) 08/10/2013  . Ataxia 08/10/2013  . Insomnia 05/21/2013  . Hypothyroidism 02/15/2013  . Fatigue 11/17/2012  . Spastic hemiplegia affecting nondominant side ([place]) 07/21/2011  . Atrial fibrillation with RVR ([place]) 06/24/2011  . Long term (current) use of anticoagulants 06/24/2011  . Hypogonadism male 04/10/2011  . Allergic rhinitis 02/13/2010  . BENIGN POSITIONAL VERTIGO 11/23/2009  . 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 Diabetes type 2, controlled ([place]) 10/05/2007  . Obstructive sleep apnea-Failed CPAP 06/17/2007  . Hyperlipidemia 12/12/2006  . GOUT 12/12/2006  . Essential hypertension 12/12/2006  . History of stroke with residual deficit 12/12/2006  . GERD 12/12/2006    Past Surgical  History:  Procedure Laterality Date  . CARDIAC CATHETERIZATION N/A 10/25/2015   Procedure: Left Heart Cath and Cors/Grafts Angiography;  Surgeon: Troy Sine, MD;  Location: [facility] CV LAB;  Service: Cardiovascular;  Laterality: N/A;  . CARDIOVERSION  06/20/2011   Procedure: CARDIOVERSION;  Surgeon: Larey Dresser, MD;  Location: [facility] ENDOSCOPY;  Service: Cardiovascular;  Laterality: N/A;  . CARDIOVERSION N/A 04/26/2015   Procedure: CARDIOVERSION;  Surgeon: Dorothy Spark, MD;  Location: [facility] ENDOSCOPY;  Service: Cardiovascular;  Laterality: N/A;  . CARDIOVERSION N/A 05/10/2015   Procedure:  CARDIOVERSION;  Surgeon: Larey Dresser, MD;  Location: [facility];  Service: Cardiovascular;  Laterality: N/A;  . CATARACT EXTRACTION  05/2010   left eye  . CATARACT EXTRACTION  04/2010   right eye  . CORONARY ARTERY BYPASS GRAFT     stent  . ESOPHAGOGASTRODUODENOSCOPY  03-25-2005  . NASAL SEPTUM SURGERY    . PERCUTANEOUS PLACEMENT INTRAVASCULAR STENT CERVICAL CAROTID ARTERY     03-2009; using a drug-eluting platform of the circumflex cornoray artery with a 3.0 x 18 [employer] Promus drug-eluting platform post dilated to 3.75 with a noncompliant balloon.  . TEE WITHOUT CARDIOVERSION  06/20/2011   Procedure: TRANSESOPHAGEAL ECHOCARDIOGRAM (TEE);  Surgeon: Larey Dresser, MD;  Location: [facility];  Service: Cardiovascular;  Laterality: N/A;       Home Medications    Prior to Admission medications   Medication Sig Start Date End Date Taking? Authorizing Provider  allopurinol (ZYLOPRIM) 100 MG tablet TAKE 2 TABLETS BY MOUTH DAILY. D/C PREVIOUS SCRIPTS FOR THIS MEDICATION 10/22/15   Debbrah Alar, NP  amiodarone (PACERONE) 100 MG tablet Take 1 tablet (100 mg total) by mouth daily. 11/05/15   Liliane Shi, PA-C  atorvastatin (LIPITOR) 40 MG tablet Take two tablets ( 80 mg total ) by mouth daily    Historical Provider, MD  benazepril (LOTENSIN) 10 MG tablet Take 10 mg by mouth daily.    Historical Provider, MD  cyclobenzaprine (FLEXERIL) 10 MG tablet Take 1 tablet (10 mg total) by mouth 2 (two) times daily as needed for muscle spasms. 12/10/15   Amond Speranza L Kamau Weatherall, PA  fluticasone (FLONASE) 50 MCG/ACT nasal spray Place 2 sprays into both nostrils daily. 06/02/14   Brunetta Jeans, PA-C  furosemide (LASIX) 40 MG tablet Take 1 tablet (40 mg total) by mouth every other day. 09/11/15   Debbrah Alar, NP  isosorbide mononitrate (IMDUR) 30 MG 24 hr tablet Take 0.5 tablets (15 mg total) by mouth daily. 11/05/15   Liliane Shi, PA-C  levothyroxine (SYNTHROID, LEVOTHROID) 112 MCG tablet  TAKE 1 TABLET (112 MCG TOTAL) BY MOUTH DAILY BEFORE BREAKFAST. 06/04/15   Debbrah Alar, NP  metFORMIN (GLUCOPHAGE) 500 MG tablet Take by mouth daily with breakfast.    Historical Provider, MD  Multiple Vitamins-Minerals (MULTIVITAMIN GUMMIES MENS PO) Take 1 tablet by mouth daily.    Historical Provider, MD  nitroGLYCERIN (NITROSTAT) 0.4 MG SL tablet Place 1 tablet (0.4 mg total) under the tongue every 5 (five) minutes as needed for chest pain. Up to 3 doses 03/05/15   Debbrah Alar, NP  pantoprazole (PROTONIX) 40 MG tablet Take 1 tablet (40 mg total) by mouth daily. 11/20/15   Debbrah Alar, NP  predniSONE (DELTASONE) 10 MG tablet 5 tab po day 1, 4 tab po day 2, 3 tab po day 3, 2 tab po day 4, 1 tab po day 5 12/05/15   Edward Saguier, PA-C  tizanidine (ZANAFLEX) 2 MG capsule  1 tab po q hs as needed muscle spasms 12/05/15   Mackie Pai, PA-C  traMADol (ULTRAM) 50 MG tablet Take 1 tablet (50 mg total) by mouth 2 (two) times daily. 07/03/15   Charlett Blake, MD  traMADol (ULTRAM-ER) 200 MG 24 hr tablet TAKE 1 TABLET EVERY DAY WITH AM DOSE OF TRAMADOL 50 MG 07/03/15   Charlett Blake, MD  vitamin B-12 1000 MCG tablet Take 1 tablet (1,000 mcg total) by mouth daily. 08/11/13   Orson Eva, MD  warfarin (COUMADIN) 2.5 MG tablet Take warfarin 2.5mg  tonight, Saturday and Sunday and recheck INR in clinic Monday 10/26/15   Leanor Kail, PA  warfarin (COUMADIN) 2.5 MG tablet TAKE AS DIRECTED BY ANTICOAGULATION CLINIC 12/10/15   Larey Dresser, MD    Family History Family History  Problem Relation Age of Onset  . Lung cancer Father     deceased  . Stroke Mother     deceased-MINISTROKES    Social History Social History  Substance Use Topics  . Smoking status: Former Research scientist (life sciences)  . Smokeless tobacco: Never Used  . Alcohol use No     Comment: 1 beer a month     Allergies   Review of patient's allergies indicates no known allergies.   Review of Systems Review of Systems    Constitutional: Negative for fever.  Respiratory: Negative for shortness of breath.   Cardiovascular: Negative for chest pain.  Gastrointestinal: Negative for abdominal pain, nausea and vomiting.  Musculoskeletal: Positive for arthralgias, back pain and myalgias.  Skin: Negative for rash and wound.  Neurological: Negative for dizziness, syncope, weakness, numbness and headaches.     Physical Exam Updated Vital Signs BP 165/83   Pulse 69   Temp 98.6 F (37 C) (Oral)   Resp 17   Ht 6\' 3"  (1.905 m)   Wt 113.9 kg   SpO2 93%   BMI 31.37 kg/m   Physical Exam  Constitutional: He appears well-developed and well-nourished. No distress.  HENT:  Head: Normocephalic and atraumatic.  Eyes: Conjunctivae are normal.  Cardiovascular: Normal rate and normal heart sounds.   Pulses:      Posterior tibial pulses are 2+ on the right side, and 2+ on the left side.  Pulmonary/Chest: Effort normal. No respiratory distress.  Musculoskeletal: Normal range of motion.  Musculoskeletal:  No deformities, step offs, ecchymosis, Good range of motion of the T-spine and L-spine No tenderness to palpation of the spinous processes of the T-spine or L-spine Mild tenderness to palpation of the paraspinous muscles of the L-spine and SI joint bilaterally  Normal 5/5 strength in lower extremities bilaterally including dorsiflexion and plantar flexion, Sensation normal to light touch Moves extremities without ataxia, coordination intact Normal gait Normal balance No Clonus    Neurological: He is alert. Coordination normal.  Skin: Skin is warm and dry. He is not diaphoretic.  Psychiatric: He has a normal mood and affect. His behavior is normal.  Nursing note and vitals reviewed.    ED Treatments / Results  Labs (all labs ordered are listed, but only abnormal results are displayed) Labs Reviewed - No data to display  EKG  EKG Interpretation None       Radiology No results  found.  Procedures Procedures (including critical care time)  Medications Ordered in ED Medications  acetaminophen (TYLENOL) tablet 650 mg (650 mg Oral Given 12/10/15 2215)  cyclobenzaprine (FLEXERIL) tablet 5 mg (5 mg Oral Given 12/10/15 2216)     Initial Impression / Assessment  and Plan / ED Course  I have reviewed the triage vital signs and the nursing notes.  Pertinent labs & imaging results that were available during my care of the patient were reviewed by me and considered in my medical decision making (see chart for details).  Clinical Course   Pt with chronic back pain. Pt wants something to help him sleep until he can f/u with his PCP. He states his back pain has improved throughout the day. I discussed the results of his MRI that his PCP had ordered. No neurological deficits on exam.  Patient can walk but states is painful.  No loss of bowel or bladder control.  No concern for cauda equina.  No fever, night sweats, weight loss, h/o IVDU.  RICE protocol and pain medicine indicated and discussed with patient. Pt requested another prednisone dose pack. With his coumadin use cannot take NSAIDs and since he just finished a steriod dose pack will not prescribe another. Discussed this with pharmacy and use of flexeril. Will d/c with flexeril and instructed pt to continue to take his tramadol and tylenol as needed. Instructed pt to f/u with PCP tomorrow. I discussed all of the results with the patient and he expressed his understanding to the verbal discharge instructions.  Pt case discussed and pt seen by Dr. Zenia Resides who agrees with the above plan.  Final Clinical Impressions(s) / ED Diagnoses   Final diagnoses:  Chronic low back pain  Chronic left hip pain    New Prescriptions New Prescriptions   CYCLOBENZAPRINE (FLEXERIL) 10 MG TABLET    Take 1 tablet (10 mg total) by mouth 2 (two) times daily as needed for muscle spasms.     Kalman Drape, PA 12/13/15 [place], [facility] 12/13/15 1214

## 2015-12-10 NOTE — Discharge Instructions (Signed)
You were given a flexeril and tylenol in the ED. Take your tramadol tonight when you get home tonight before bed. Follow-up with your primary care provider tomorrow to discuss the results of your MRI. Return immediately to the emergency department if you ask. Worsening pain, new numbness/tingling, weakness, fever, chest pain, shortness of breath, or any other concerning symptoms.

## 2015-12-10 NOTE — Telephone Encounter (Signed)
[name]-- pt seen by Percell Miller on 12/08/15. Please advise?

## 2015-12-10 NOTE — ED Triage Notes (Signed)
Patient c/o left hip/back/left arm/and left leg pain that has been going on over week.  Patient states that Saturday he was sitting on stool and fell.  Patient was seen at [facility] on Saturday for an MRI of his back.  Patient unsure of what results showed.

## 2015-12-10 NOTE — Telephone Encounter (Signed)
I reviewed his chart and it looks like he is following with Dr. Aretta Nip for pain management. I would recommend that he contact Dr. Aretta Nip for refills on his pain medication.   I will see what we can do to get him in quickly to neurosurgery.   [name], can we please see if we can get him in this week due to pain. OK to refer to Neurosurgeon in [place] if unable to find [place] doctor who can see him.

## 2015-12-10 NOTE — ED Provider Notes (Signed)
71 year old male presents with worsening lower back pain after rolling off the stool. Has history of chronic back pain is requesting pain management here. Does not want answers at this time. He is ambulatory. Will medicate discharged home   Lacretia Leigh, MD 12/10/15 2154

## 2015-12-11 ENCOUNTER — Encounter (HOSPITAL_COMMUNITY): Payer: Self-pay

## 2015-12-11 ENCOUNTER — Observation Stay (HOSPITAL_COMMUNITY)
Admission: EM | Admit: 2015-12-11 | Discharge: 2015-12-13 | Disposition: A | Discharge status: Home / Self Care | Payer: Medicare Other | Attending: Internal Medicine | Admitting: Internal Medicine

## 2015-12-11 DIAGNOSIS — R0902 Hypoxemia: Secondary | ICD-10-CM

## 2015-12-11 DIAGNOSIS — G9340 Encephalopathy, unspecified: Secondary | ICD-10-CM

## 2015-12-11 DIAGNOSIS — G92 Toxic encephalopathy: Secondary | ICD-10-CM | POA: Diagnosis not present

## 2015-12-11 DIAGNOSIS — E039 Hypothyroidism, unspecified: Secondary | ICD-10-CM | POA: Diagnosis not present

## 2015-12-11 DIAGNOSIS — I700 Atherosclerosis of aorta: Secondary | ICD-10-CM | POA: Insufficient documentation

## 2015-12-11 DIAGNOSIS — Z7984 Long term (current) use of oral hypoglycemic drugs: Secondary | ICD-10-CM | POA: Diagnosis not present

## 2015-12-11 DIAGNOSIS — I2510 Atherosclerotic heart disease of native coronary artery without angina pectoris: Secondary | ICD-10-CM | POA: Insufficient documentation

## 2015-12-11 DIAGNOSIS — Z7901 Long term (current) use of anticoagulants: Secondary | ICD-10-CM | POA: Insufficient documentation

## 2015-12-11 DIAGNOSIS — E119 Type 2 diabetes mellitus without complications: Secondary | ICD-10-CM | POA: Diagnosis not present

## 2015-12-11 DIAGNOSIS — Z7951 Long term (current) use of inhaled steroids: Secondary | ICD-10-CM | POA: Insufficient documentation

## 2015-12-11 DIAGNOSIS — J309 Allergic rhinitis, unspecified: Secondary | ICD-10-CM | POA: Insufficient documentation

## 2015-12-11 DIAGNOSIS — Z951 Presence of aortocoronary bypass graft: Secondary | ICD-10-CM | POA: Insufficient documentation

## 2015-12-11 DIAGNOSIS — M4806 Spinal stenosis, lumbar region: Secondary | ICD-10-CM | POA: Diagnosis not present

## 2015-12-11 DIAGNOSIS — I5032 Chronic diastolic (congestive) heart failure: Secondary | ICD-10-CM | POA: Diagnosis not present

## 2015-12-11 DIAGNOSIS — I480 Paroxysmal atrial fibrillation: Secondary | ICD-10-CM | POA: Insufficient documentation

## 2015-12-11 DIAGNOSIS — R062 Wheezing: Secondary | ICD-10-CM

## 2015-12-11 DIAGNOSIS — I25118 Atherosclerotic heart disease of native coronary artery with other forms of angina pectoris: Secondary | ICD-10-CM | POA: Diagnosis present

## 2015-12-11 DIAGNOSIS — K219 Gastro-esophageal reflux disease without esophagitis: Secondary | ICD-10-CM | POA: Insufficient documentation

## 2015-12-11 DIAGNOSIS — J9622 Acute and chronic respiratory failure with hypercapnia: Secondary | ICD-10-CM | POA: Diagnosis not present

## 2015-12-11 DIAGNOSIS — Z823 Family history of stroke: Secondary | ICD-10-CM | POA: Insufficient documentation

## 2015-12-11 DIAGNOSIS — Z8673 Personal history of transient ischemic attack (TIA), and cerebral infarction without residual deficits: Secondary | ICD-10-CM | POA: Insufficient documentation

## 2015-12-11 DIAGNOSIS — Z955 Presence of coronary angioplasty implant and graft: Secondary | ICD-10-CM | POA: Diagnosis not present

## 2015-12-11 DIAGNOSIS — I4819 Other persistent atrial fibrillation: Secondary | ICD-10-CM | POA: Diagnosis present

## 2015-12-11 DIAGNOSIS — M109 Gout, unspecified: Secondary | ICD-10-CM | POA: Diagnosis not present

## 2015-12-11 DIAGNOSIS — I4811 Longstanding persistent atrial fibrillation: Secondary | ICD-10-CM | POA: Diagnosis present

## 2015-12-11 DIAGNOSIS — Z79899 Other long term (current) drug therapy: Secondary | ICD-10-CM | POA: Diagnosis not present

## 2015-12-11 DIAGNOSIS — I110 Hypertensive heart disease with heart failure: Secondary | ICD-10-CM | POA: Insufficient documentation

## 2015-12-11 DIAGNOSIS — Z9889 Other specified postprocedural states: Secondary | ICD-10-CM | POA: Insufficient documentation

## 2015-12-11 DIAGNOSIS — Z87891 Personal history of nicotine dependence: Secondary | ICD-10-CM | POA: Diagnosis not present

## 2015-12-11 DIAGNOSIS — G4733 Obstructive sleep apnea (adult) (pediatric): Secondary | ICD-10-CM | POA: Diagnosis present

## 2015-12-11 DIAGNOSIS — J9601 Acute respiratory failure with hypoxia: Secondary | ICD-10-CM

## 2015-12-11 DIAGNOSIS — J209 Acute bronchitis, unspecified: Secondary | ICD-10-CM | POA: Insufficient documentation

## 2015-12-11 DIAGNOSIS — Z801 Family history of malignant neoplasm of trachea, bronchus and lung: Secondary | ICD-10-CM | POA: Insufficient documentation

## 2015-12-11 DIAGNOSIS — Z9842 Cataract extraction status, left eye: Secondary | ICD-10-CM | POA: Insufficient documentation

## 2015-12-11 DIAGNOSIS — M5126 Other intervertebral disc displacement, lumbar region: Secondary | ICD-10-CM | POA: Diagnosis not present

## 2015-12-11 DIAGNOSIS — J9602 Acute respiratory failure with hypercapnia: Secondary | ICD-10-CM

## 2015-12-11 DIAGNOSIS — R404 Transient alteration of awareness: Secondary | ICD-10-CM | POA: Diagnosis not present

## 2015-12-11 DIAGNOSIS — I10 Essential (primary) hypertension: Secondary | ICD-10-CM | POA: Diagnosis present

## 2015-12-11 DIAGNOSIS — R0602 Shortness of breath: Secondary | ICD-10-CM | POA: Diagnosis not present

## 2015-12-11 DIAGNOSIS — E785 Hyperlipidemia, unspecified: Secondary | ICD-10-CM | POA: Insufficient documentation

## 2015-12-11 DIAGNOSIS — Z9841 Cataract extraction status, right eye: Secondary | ICD-10-CM | POA: Insufficient documentation

## 2015-12-11 DIAGNOSIS — R531 Weakness: Secondary | ICD-10-CM | POA: Diagnosis not present

## 2015-12-11 DIAGNOSIS — I6930 Unspecified sequelae of cerebral infarction: Secondary | ICD-10-CM

## 2015-12-11 NOTE — Telephone Encounter (Signed)
Patient is requesting call back from provider to find out her opinion on the back surgery. States he wants to know what provider thinks before he has surgery.

## 2015-12-11 NOTE — Telephone Encounter (Signed)
Spoke with pt and advised him that Melissa wanted him seen by neurosurgeon this week to discuss treatment options. Pt voices understanding and will keep appt for tomorrow. States he just wants some relief but is apprehensive about surgery. Advised him they will let him know tomorrow what options are available to him.

## 2015-12-11 NOTE — ED Triage Notes (Signed)
Pt brought from via EMS with c/o generalized weakness. Per EMS pt was seen at [facility] for back pain yesterday and received prescriptions for Flexiril and Tramadol which he received last around 1300. Pt A/O x 4. Pt has hx of stroke 21 years ago with left sided deficit. Pt on warfarin.

## 2015-12-11 NOTE — Telephone Encounter (Signed)
Left patient message to get in contact with Dr. Aretta Nip for pain management. PC

## 2015-12-11 NOTE — Telephone Encounter (Signed)
I called [facility] and scheduled pt for tomorrow 12/12/15 1:30pm with Dr. Marland Kitchen Ditty. I have notified pt/spouse of appt.

## 2015-12-12 ENCOUNTER — Emergency Department (HOSPITAL_COMMUNITY): Payer: Medicare Other

## 2015-12-12 ENCOUNTER — Ambulatory Visit: Payer: Medicare Other | Admitting: Medical

## 2015-12-12 ENCOUNTER — Other Ambulatory Visit: Payer: Self-pay | Admitting: Family

## 2015-12-12 ENCOUNTER — Encounter (HOSPITAL_COMMUNITY): Payer: Self-pay | Admitting: Family Medicine

## 2015-12-12 DIAGNOSIS — I5032 Chronic diastolic (congestive) heart failure: Secondary | ICD-10-CM

## 2015-12-12 DIAGNOSIS — G4733 Obstructive sleep apnea (adult) (pediatric): Secondary | ICD-10-CM

## 2015-12-12 DIAGNOSIS — I10 Essential (primary) hypertension: Secondary | ICD-10-CM

## 2015-12-12 DIAGNOSIS — E119 Type 2 diabetes mellitus without complications: Secondary | ICD-10-CM

## 2015-12-12 DIAGNOSIS — J9602 Acute respiratory failure with hypercapnia: Secondary | ICD-10-CM

## 2015-12-12 DIAGNOSIS — G9340 Encephalopathy, unspecified: Secondary | ICD-10-CM

## 2015-12-12 DIAGNOSIS — J9601 Acute respiratory failure with hypoxia: Secondary | ICD-10-CM | POA: Diagnosis not present

## 2015-12-12 DIAGNOSIS — R0602 Shortness of breath: Secondary | ICD-10-CM | POA: Diagnosis not present

## 2015-12-12 DIAGNOSIS — I2510 Atherosclerotic heart disease of native coronary artery without angina pectoris: Secondary | ICD-10-CM

## 2015-12-12 DIAGNOSIS — I480 Paroxysmal atrial fibrillation: Secondary | ICD-10-CM

## 2015-12-12 DIAGNOSIS — G92 Toxic encephalopathy: Secondary | ICD-10-CM | POA: Diagnosis not present

## 2015-12-12 DIAGNOSIS — E039 Hypothyroidism, unspecified: Secondary | ICD-10-CM

## 2015-12-12 DIAGNOSIS — I6930 Unspecified sequelae of cerebral infarction: Secondary | ICD-10-CM

## 2015-12-12 HISTORY — DX: Encephalopathy, unspecified: G93.40

## 2015-12-12 HISTORY — DX: Acute respiratory failure with hypercapnia: J96.02

## 2015-12-12 HISTORY — DX: Acute respiratory failure with hypoxia: J96.01

## 2015-12-12 LAB — BASIC METABOLIC PANEL
ANION GAP: 5 (ref 5–15)
BUN: 18 mg/dL (ref 6–20)
CHLORIDE: 99 mmol/L — AB (ref 101–111)
CO2: 33 mmol/L — AB (ref 22–32)
Calcium: 8.9 mg/dL (ref 8.9–10.3)
Creatinine, Ser: 1.25 mg/dL — ABNORMAL HIGH (ref 0.61–1.24)
GFR calc Af Amer: >60 mL/min (ref >60)
GFR, EST NON AFRICAN AMERICAN: 56 mL/min — AB (ref >60)
GLUCOSE: 118 mg/dL — AB (ref 65–99)
POTASSIUM: 4.5 mmol/L (ref 3.5–5.1)
Sodium: 137 mmol/L (ref 135–145)

## 2015-12-12 LAB — URINALYSIS, ROUTINE W REFLEX MICROSCOPIC
Bilirubin Urine: NEGATIVE
Glucose, UA: 250 mg/dL — AB
Hgb urine dipstick: NEGATIVE
Ketones, ur: NEGATIVE mg/dL
LEUKOCYTES UA: NEGATIVE
Nitrite: NEGATIVE
PROTEIN: NEGATIVE mg/dL
SPECIFIC GRAVITY, URINE: 1.036 — AB (ref 1.005–1.030)
pH: 5 (ref 5.0–8.0)

## 2015-12-12 LAB — I-STAT ARTERIAL BLOOD GAS, ED
Acid-Base Excess: 3 mmol/L — ABNORMAL HIGH (ref 0.0–2.0)
Bicarbonate: 29.2 mmol/L — ABNORMAL HIGH (ref 20.0–28.0)
O2 SAT: 91 %
PCO2 ART: 52 mmHg — AB (ref 32.0–48.0)
PH ART: 7.362 (ref 7.350–7.450)
Patient temperature: 100.3
TCO2: 31 mmol/L (ref 0–100)
pO2, Arterial: 67 mmHg — ABNORMAL LOW (ref 83.0–108.0)

## 2015-12-12 LAB — VITAMIN B12: Vitamin B-12: 448 pg/mL (ref 180–914)

## 2015-12-12 LAB — CBC WITH DIFFERENTIAL/PLATELET
BASOS ABS: 0.1 10*3/uL (ref 0.0–0.1)
Basophils Relative: 1 %
Eosinophils Absolute: 0.3 10*3/uL (ref 0.0–0.7)
Eosinophils Relative: 4 %
HEMATOCRIT: 43.4 % (ref 39.0–52.0)
Hemoglobin: 13.8 g/dL (ref 13.0–17.0)
LYMPHS PCT: 16 %
Lymphs Abs: 1 10*3/uL (ref 0.7–4.0)
MCH: 30.1 pg (ref 26.0–34.0)
MCHC: 31.8 g/dL (ref 30.0–36.0)
MCV: 94.6 fL (ref 78.0–100.0)
MONO ABS: 0.9 10*3/uL (ref 0.1–1.0)
Monocytes Relative: 13 %
NEUTROS ABS: 4.2 10*3/uL (ref 1.7–7.7)
Neutrophils Relative %: 66 %
Platelets: 164 10*3/uL (ref 150–400)
RBC: 4.59 MIL/uL (ref 4.22–5.81)
RDW: 13.9 % (ref 11.5–15.5)
WBC: 6.4 10*3/uL (ref 4.0–10.5)

## 2015-12-12 LAB — PROTIME-INR
INR: 1.98
Prothrombin Time: 22.8 seconds — ABNORMAL HIGH (ref 11.4–15.2)

## 2015-12-12 LAB — PROCALCITONIN: PROCALCITONIN: 0.14 ng/mL

## 2015-12-12 LAB — GLUCOSE, CAPILLARY
GLUCOSE-CAPILLARY: 239 mg/dL — AB (ref 65–99)
GLUCOSE-CAPILLARY: 204 mg/dL — AB (ref 65–99)

## 2015-12-12 LAB — BRAIN NATRIURETIC PEPTIDE: B Natriuretic Peptide: 53.6 pg/mL (ref 0.0–100.0)

## 2015-12-12 LAB — RAPID URINE DRUG SCREEN, HOSP PERFORMED
AMPHETAMINES: NOT DETECTED
BENZODIAZEPINES: NOT DETECTED
Barbiturates: NOT DETECTED
COCAINE: NOT DETECTED
OPIATES: NOT DETECTED
TETRAHYDROCANNABINOL: NOT DETECTED

## 2015-12-12 LAB — I-STAT CG4 LACTIC ACID, ED: Lactic Acid, Venous: 1.05 mmol/L (ref 0.5–1.9)

## 2015-12-12 LAB — AMMONIA: Ammonia: 16 umol/L (ref 9–35)

## 2015-12-12 LAB — TSH: TSH: 0.516 u[IU]/mL (ref 0.350–4.500)

## 2015-12-12 MED ORDER — LEVOTHYROXINE SODIUM 112 MCG PO TABS
112.0000 ug | ORAL_TABLET | Freq: Every day | ORAL | Status: DC
  Administered 2015-12-12 – 2015-12-13 (×2): 112 ug via ORAL
  Filled 2015-12-12 (×2): qty 1

## 2015-12-12 MED ORDER — SODIUM CHLORIDE 0.9 % IV BOLUS (SEPSIS): 1000.0000 mL | Freq: Once | INTRAVENOUS | Status: DC

## 2015-12-12 MED ORDER — ACETAMINOPHEN 325 MG PO TABS: 650.0000 mg | ORAL_TABLET | Freq: Four times a day (QID) | ORAL | Status: DC | PRN

## 2015-12-12 MED ORDER — ISOSORBIDE MONONITRATE ER 30 MG PO TB24
15.0000 mg | ORAL_TABLET | Freq: Every day | ORAL | Status: DC
  Administered 2015-12-12 – 2015-12-13 (×2): 15 mg via ORAL
  Filled 2015-12-12 (×2): qty 1

## 2015-12-12 MED ORDER — AMIODARONE HCL 200 MG PO TABS
100.0000 mg | ORAL_TABLET | Freq: Every day | ORAL | Status: DC
  Administered 2015-12-12 – 2015-12-13 (×2): 100 mg via ORAL
  Filled 2015-12-12 (×2): qty 1

## 2015-12-12 MED ORDER — ATORVASTATIN CALCIUM 80 MG PO TABS
80.0000 mg | ORAL_TABLET | Freq: Every day | ORAL | Status: DC
  Administered 2015-12-12: 80 mg via ORAL
  Filled 2015-12-12: qty 1

## 2015-12-12 MED ORDER — PREDNISONE 50 MG PO TABS
50.0000 mg | ORAL_TABLET | Freq: Every day | ORAL | Status: DC
  Administered 2015-12-12 – 2015-12-13 (×2): 50 mg via ORAL
  Filled 2015-12-12: qty 3
  Filled 2015-12-12: qty 1

## 2015-12-12 MED ORDER — ALBUTEROL SULFATE (2.5 MG/3ML) 0.083% IN NEBU
5.0000 mg | INHALATION_SOLUTION | Freq: Once | RESPIRATORY_TRACT | Status: AC
  Administered 2015-12-12: 5 mg via RESPIRATORY_TRACT
  Filled 2015-12-12: qty 6

## 2015-12-12 MED ORDER — INFLUENZA VAC SPLIT QUAD 0.5 ML IM SUSY
0.5000 mL | PREFILLED_SYRINGE | INTRAMUSCULAR | Status: AC
  Administered 2015-12-13: 0.5 mL via INTRAMUSCULAR
  Filled 2015-12-12: qty 0.5

## 2015-12-12 MED ORDER — ONDANSETRON HCL 4 MG/2ML IJ SOLN: 4.0000 mg | Freq: Four times a day (QID) | INTRAMUSCULAR | Status: DC | PRN

## 2015-12-12 MED ORDER — PREDNISONE 20 MG PO TABS
60.0000 mg | ORAL_TABLET | Freq: Once | ORAL | Status: AC
  Administered 2015-12-12: 60 mg via ORAL
  Filled 2015-12-12: qty 3

## 2015-12-12 MED ORDER — TRAMADOL HCL ER 200 MG PO TB24: 200.0000 mg | ORAL_TABLET | Freq: Every day | ORAL | Status: DC

## 2015-12-12 MED ORDER — TRAMADOL HCL 50 MG PO TABS
50.0000 mg | ORAL_TABLET | Freq: Two times a day (BID) | ORAL | Status: DC
  Administered 2015-12-12: 50 mg via ORAL
  Filled 2015-12-12: qty 1

## 2015-12-12 MED ORDER — BENAZEPRIL HCL 10 MG PO TABS
10.0000 mg | ORAL_TABLET | Freq: Every day | ORAL | Status: DC
  Administered 2015-12-12 – 2015-12-13 (×2): 10 mg via ORAL
  Filled 2015-12-12 (×2): qty 1

## 2015-12-12 MED ORDER — WARFARIN - PHARMACIST DOSING INPATIENT
Freq: Every day | Status: DC
  Filled 2015-12-12: qty 1

## 2015-12-12 MED ORDER — ONDANSETRON HCL 4 MG PO TABS: 4.0000 mg | ORAL_TABLET | Freq: Four times a day (QID) | ORAL | Status: DC | PRN

## 2015-12-12 MED ORDER — ACETAMINOPHEN 650 MG RE SUPP
650.0000 mg | Freq: Four times a day (QID) | RECTAL | Status: DC | PRN
Start: 2015-12-12 — End: 2015-12-13

## 2015-12-12 MED ORDER — IPRATROPIUM BROMIDE 0.02 % IN SOLN
0.5000 mg | Freq: Four times a day (QID) | RESPIRATORY_TRACT | Status: DC
  Administered 2015-12-12: 0.5 mg via RESPIRATORY_TRACT
  Filled 2015-12-12: qty 2.5

## 2015-12-12 MED ORDER — SODIUM CHLORIDE 0.9 % IV BOLUS (SEPSIS)
500.0000 mL | Freq: Once | INTRAVENOUS | Status: AC
  Administered 2015-12-12: 500 mL via INTRAVENOUS

## 2015-12-12 MED ORDER — ALLOPURINOL 100 MG PO TABS
200.0000 mg | ORAL_TABLET | Freq: Every day | ORAL | Status: DC
  Administered 2015-12-12 – 2015-12-13 (×2): 200 mg via ORAL
  Filled 2015-12-12 (×2): qty 2

## 2015-12-12 MED ORDER — WARFARIN SODIUM 3 MG PO TABS
3.0000 mg | ORAL_TABLET | Freq: Once | ORAL | Status: AC
  Administered 2015-12-12: 3 mg via ORAL
  Filled 2015-12-12 (×2): qty 1

## 2015-12-12 MED ORDER — IPRATROPIUM-ALBUTEROL 0.5-2.5 (3) MG/3ML IN SOLN
3.0000 mL | Freq: Four times a day (QID) | RESPIRATORY_TRACT | Status: DC
  Administered 2015-12-12 (×2): 3 mL via RESPIRATORY_TRACT
  Filled 2015-12-12: qty 3

## 2015-12-12 MED ORDER — IPRATROPIUM-ALBUTEROL 0.5-2.5 (3) MG/3ML IN SOLN
3.0000 mL | Freq: Three times a day (TID) | RESPIRATORY_TRACT | Status: DC
  Administered 2015-12-13: 3 mL via RESPIRATORY_TRACT
  Filled 2015-12-12: qty 3

## 2015-12-12 MED ORDER — ENSURE ENLIVE PO LIQD: 237.0000 mL | Freq: Two times a day (BID) | ORAL | Status: DC

## 2015-12-12 MED ORDER — PANTOPRAZOLE SODIUM 40 MG PO TBEC
40.0000 mg | DELAYED_RELEASE_TABLET | Freq: Every day | ORAL | Status: DC
  Administered 2015-12-12 – 2015-12-13 (×2): 40 mg via ORAL
  Filled 2015-12-12 (×2): qty 1

## 2015-12-12 MED ORDER — INSULIN ASPART 100 UNIT/ML ~~LOC~~ SOLN
0.0000 [IU] | Freq: Three times a day (TID) | SUBCUTANEOUS | Status: DC
  Administered 2015-12-12: 3 [IU] via SUBCUTANEOUS
  Administered 2015-12-13: 1 [IU] via SUBCUTANEOUS
  Administered 2015-12-13: 2 [IU] via SUBCUTANEOUS

## 2015-12-12 MED ORDER — ALBUTEROL SULFATE (2.5 MG/3ML) 0.083% IN NEBU: 2.5000 mg | INHALATION_SOLUTION | RESPIRATORY_TRACT | Status: DC | PRN

## 2015-12-12 MED ORDER — IPRATROPIUM-ALBUTEROL 0.5-2.5 (3) MG/3ML IN SOLN
RESPIRATORY_TRACT | Status: AC
  Filled 2015-12-12: qty 3

## 2015-12-12 MED ORDER — FUROSEMIDE 40 MG PO TABS
40.0000 mg | ORAL_TABLET | ORAL | Status: DC
  Administered 2015-12-13: 40 mg via ORAL
  Filled 2015-12-12: qty 1

## 2015-12-12 MED ORDER — ALBUTEROL (5 MG/ML) CONTINUOUS INHALATION SOLN
10.0000 mg/h | INHALATION_SOLUTION | Freq: Once | RESPIRATORY_TRACT | Status: AC
  Administered 2015-12-12: 10 mg/h via RESPIRATORY_TRACT
  Filled 2015-12-12: qty 20

## 2015-12-12 MED ORDER — TRAMADOL HCL 50 MG PO TABS
50.0000 mg | ORAL_TABLET | Freq: Four times a day (QID) | ORAL | Status: DC
  Administered 2015-12-12 – 2015-12-13 (×4): 50 mg via ORAL
  Filled 2015-12-12 (×4): qty 1

## 2015-12-12 MED ORDER — ACETAMINOPHEN 325 MG PO TABS
650.0000 mg | ORAL_TABLET | Freq: Once | ORAL | Status: AC
  Administered 2015-12-12: 650 mg via ORAL
  Filled 2015-12-12: qty 2

## 2015-12-12 MED ORDER — INSULIN ASPART 100 UNIT/ML ~~LOC~~ SOLN
0.0000 [IU] | Freq: Every day | SUBCUTANEOUS | Status: DC
  Administered 2015-12-12: 2 [IU] via SUBCUTANEOUS

## 2015-12-12 MED ORDER — IPRATROPIUM-ALBUTEROL 0.5-2.5 (3) MG/3ML IN SOLN
3.0000 mL | Freq: Once | RESPIRATORY_TRACT | Status: AC
  Administered 2015-12-12: 3 mL via RESPIRATORY_TRACT
  Filled 2015-12-12: qty 3

## 2015-12-12 MED ORDER — ALBUTEROL SULFATE (2.5 MG/3ML) 0.083% IN NEBU
2.5000 mg | INHALATION_SOLUTION | Freq: Four times a day (QID) | RESPIRATORY_TRACT | Status: DC
  Administered 2015-12-12: 2.5 mg via RESPIRATORY_TRACT
  Filled 2015-12-12: qty 3

## 2015-12-12 NOTE — ED Notes (Addendum)
Patient ambulated in hallway on room air with pulse ox. Pulse ox 93-94% at the beginning prior to ambulation. During ambulation, pulse ox dropped to 83% on room air. Patient placed back on oxygen at 2 LPM via nasal cannula. Pulse ox 99-100% within 1 minute. No acute respiratory distress noted during ambulation.

## 2015-12-12 NOTE — Care Management Obs Status (Signed)
[place] NOTIFICATION   Patient Details  Name: [name] MRN: [id number] Date of Birth: June 23, 1945   Medicare Observation Status Notification Given:  Yes    Vergie Living, RN 12/12/2015, 3:44 PM

## 2015-12-12 NOTE — ED Provider Notes (Signed)
[facility] DEPT Provider Note   CSN: [id number] Arrival date & time: 12/11/15  2325  By signing my name below, I, Dora Sims, attest that this documentation has been prepared under the direction and in the presence of physician practitioner, Ripley Fraise, MD. Electronically Signed: Dora Sims, Scribe. 12/12/2015. 12:05 AM.  History   Chief Complaint Chief Complaint  Patient presents with  . Weakness    The history is provided by the patient and a relative. No language interpreter was used.  Weakness  Primary symptoms include focal weakness. Primary symptoms comment: generalized weakness. The current episode started more than 2 days ago. The problem has not changed since onset.Affected Side: generalized. There has been no fever. Associated symptoms include shortness of breath and confusion. Pertinent negatives include no chest pain, no vomiting and no headaches. There were no medications administered prior to arrival. Associated medical issues include CVA.     HPI Comments: [name] is a 70 y.o. male brought in by EMS, with PMHx of CVA and TIA, who presents to the Emergency Department complaining of constant generalized weakness for the past several days. He states he feels weak "all over." Pt reports he has left-sided weakness at baseline as a result of a stroke; he states this weakness waxes and wanes in intensity. Pt reports he fell a couple of days ago and was seen in the ER at [facility] for back pain; he was prescribed Flexeril and Tramadol and his family member states he has been confused and fatigued since using these medications. family notes pt has had a productive cough and been short of breath recently as well. He is not a smoker. Pt denies h/o asthma or emphysema. Pt denies fever, vomiting, headache, CP, abdominal pain, urinary frequency, dysuria, or any other associated symptoms.  Past Medical History:  Diagnosis Date  . CAD (coronary artery disease)    s/p CABG, s/p DES to LCX  January 2011  . Chronic pain    left sided-[place]  . CVA (cerebral infarction)    left hemiparesis  . Diastolic CHF ([place])   . Dysrhythmia    hx of atrial fibrilation with cardioversion  . GERD (gastroesophageal reflux disease)   . Gout   . Hyperlipidemia   . Hypertension   . Hypothyroidism   . OSA on CPAP   . Sleep apnea   . Stroke ([place])   . Tubular adenoma of colon 05/2010    Patient Active Problem List   Diagnosis Date Noted  . Coronary artery disease involving native coronary artery with angina pectoris ([place]) 11/04/2015  . Angina at rest [facility]) 10/22/2015  . Atrial fibrillation ([place]) 05/09/2015  . Atrial fibrillation with rapid ventricular response ([place])   . CAD S/P CFX DES 2011 04/25/2015  . PAF - CAHDs VASc=7 04/25/2015  . Vasovagal syncope   . Chest pain 04/24/2015  . Constipation 04/24/2015  . Diastolic CHF -EF [id number] with grade 2 DD 2014 04/24/2015  . Dizziness and giddiness 08/16/2014  . B12 deficiency 08/11/2013  . Sensory disturbance 08/11/2013  . Left-sided weakness 08/10/2013  . TIA (transient ischemic attack) 08/10/2013  . Ataxia 08/10/2013  . Insomnia 05/21/2013  . Hypothyroidism 02/15/2013  . Fatigue 11/17/2012  . Spastic hemiplegia affecting nondominant side ([place]) 07/21/2011  . Atrial fibrillation with RVR ([place]) 06/24/2011  . Long term (current) use of anticoagulants 06/24/2011  . Hypogonadism male 04/10/2011  . Allergic rhinitis 02/13/2010  . BENIGN POSITIONAL VERTIGO 11/23/2009  . 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  Diabetes type 2, controlled ([place]) 10/05/2007  . Obstructive sleep apnea-Failed CPAP 06/17/2007  . Hyperlipidemia 12/12/2006  . GOUT 12/12/2006  . Essential hypertension 12/12/2006  . History of stroke with residual deficit 12/12/2006  . GERD 12/12/2006    Past Surgical History:  Procedure Laterality Date  . CARDIAC CATHETERIZATION N/A 10/25/2015   Procedure: Left Heart Cath and Cors/Grafts Angiography;   Surgeon: Troy Sine, MD;  Location: [facility] CV LAB;  Service: Cardiovascular;  Laterality: N/A;  . CARDIOVERSION  06/20/2011   Procedure: CARDIOVERSION;  Surgeon: Larey Dresser, MD;  Location: [facility] ENDOSCOPY;  Service: Cardiovascular;  Laterality: N/A;  . CARDIOVERSION N/A 04/26/2015   Procedure: CARDIOVERSION;  Surgeon: Dorothy Spark, MD;  Location: [facility] ENDOSCOPY;  Service: Cardiovascular;  Laterality: N/A;  . CARDIOVERSION N/A 05/10/2015   Procedure: CARDIOVERSION;  Surgeon: Larey Dresser, MD;  Location: [facility];  Service: Cardiovascular;  Laterality: N/A;  . CATARACT EXTRACTION  05/2010   left eye  . CATARACT EXTRACTION  04/2010   right eye  . CORONARY ARTERY BYPASS GRAFT     stent  . ESOPHAGOGASTRODUODENOSCOPY  03-25-2005  . NASAL SEPTUM SURGERY    . PERCUTANEOUS PLACEMENT INTRAVASCULAR STENT CERVICAL CAROTID ARTERY     03-2009; using a drug-eluting platform of the circumflex cornoray artery with a 3.0 x 18 [employer] Promus drug-eluting platform post dilated to 3.75 with a noncompliant balloon.  . TEE WITHOUT CARDIOVERSION  06/20/2011   Procedure: TRANSESOPHAGEAL ECHOCARDIOGRAM (TEE);  Surgeon: Larey Dresser, MD;  Location: [facility];  Service: Cardiovascular;  Laterality: N/A;       Home Medications    Prior to Admission medications   Medication Sig Start Date End Date Taking? Authorizing Provider  allopurinol (ZYLOPRIM) 100 MG tablet TAKE 2 TABLETS BY MOUTH DAILY. D/C PREVIOUS SCRIPTS FOR THIS MEDICATION 10/22/15   Debbrah Alar, NP  amiodarone (PACERONE) 100 MG tablet Take 1 tablet (100 mg total) by mouth daily. 11/05/15   Liliane Shi, PA-C  atorvastatin (LIPITOR) 40 MG tablet Take two tablets ( 80 mg total ) by mouth daily    Historical Provider, MD  benazepril (LOTENSIN) 10 MG tablet Take 10 mg by mouth daily.    Historical Provider, MD  cyclobenzaprine (FLEXERIL) 10 MG tablet Take 1 tablet (10 mg total) by mouth 2 (two) times daily as needed for  muscle spasms. 12/10/15   Jessica L Focht, PA  fluticasone (FLONASE) 50 MCG/ACT nasal spray Place 2 sprays into both nostrils daily. 06/02/14   Brunetta Jeans, PA-C  furosemide (LASIX) 40 MG tablet Take 1 tablet (40 mg total) by mouth every other day. 09/11/15   Debbrah Alar, NP  isosorbide mononitrate (IMDUR) 30 MG 24 hr tablet Take 0.5 tablets (15 mg total) by mouth daily. 11/05/15   Liliane Shi, PA-C  levothyroxine (SYNTHROID, LEVOTHROID) 112 MCG tablet TAKE 1 TABLET (112 MCG TOTAL) BY MOUTH DAILY BEFORE BREAKFAST. 06/04/15   Debbrah Alar, NP  metFORMIN (GLUCOPHAGE) 500 MG tablet Take by mouth daily with breakfast.    Historical Provider, MD  Multiple Vitamins-Minerals (MULTIVITAMIN GUMMIES MENS PO) Take 1 tablet by mouth daily.    Historical Provider, MD  nitroGLYCERIN (NITROSTAT) 0.4 MG SL tablet Place 1 tablet (0.4 mg total) under the tongue every 5 (five) minutes as needed for chest pain. Up to 3 doses 03/05/15   Debbrah Alar, NP  pantoprazole (PROTONIX) 40 MG tablet Take 1 tablet (40 mg total) by mouth daily. 11/20/15   Debbrah Alar,  NP  predniSONE (DELTASONE) 10 MG tablet 5 tab po day 1, 4 tab po day 2, 3 tab po day 3, 2 tab po day 4, 1 tab po day 5 12/05/15   Mackie Pai, PA-C  tizanidine (ZANAFLEX) 2 MG capsule 1 tab po q hs as needed muscle spasms 12/05/15   Mackie Pai, PA-C  traMADol (ULTRAM) 50 MG tablet Take 1 tablet (50 mg total) by mouth 2 (two) times daily. 07/03/15   Charlett Blake, MD  traMADol (ULTRAM-ER) 200 MG 24 hr tablet TAKE 1 TABLET EVERY DAY WITH AM DOSE OF TRAMADOL 50 MG 07/03/15   Charlett Blake, MD  vitamin B-12 1000 MCG tablet Take 1 tablet (1,000 mcg total) by mouth daily. 08/11/13   Orson Eva, MD  warfarin (COUMADIN) 2.5 MG tablet Take warfarin 2.5mg  tonight, Saturday and Sunday and recheck INR in clinic Monday 10/26/15   Leanor Kail, PA  warfarin (COUMADIN) 2.5 MG tablet TAKE AS DIRECTED BY ANTICOAGULATION CLINIC 12/10/15   Larey Dresser, MD    Family History Family History  Problem Relation Age of Onset  . Lung cancer Father     deceased  . Stroke Mother     deceased-[place]    Social History Social History  Substance Use Topics  . Smoking status: Former Research scientist (life sciences)  . Smokeless tobacco: Never Used  . Alcohol use No     Comment: 1 beer a month     Allergies   Review of patient's allergies indicates no known allergies.   Review of Systems Review of Systems  Constitutional: Positive for fatigue. Negative for fever.  Respiratory: Positive for cough and shortness of breath.   Cardiovascular: Negative for chest pain.  Gastrointestinal: Negative for abdominal pain and vomiting.  Genitourinary: Negative for dysuria and frequency.  Neurological: Positive for focal weakness and weakness (generalized; left-sided at baseline). Negative for headaches.  Psychiatric/Behavioral: Positive for confusion.  All other systems reviewed and are negative.   Physical Exam Updated Vital Signs BP 147/73 (BP Location: Right Arm)   Pulse 84   Temp 100.2 F (37.9 C) (Oral)   Resp 18   SpO2 98%   Physical Exam  CONSTITUTIONAL: elderly HEAD: Normocephalic/atraumatic EYES: EOMI ENMT: Mucous membranes moist NECK: supple no meningeal signs SPINE/BACK:entire spine nontender CV: S1/S2 noted, no murmurs/rubs/gallops noted LUNGS: Tachypnea and wheezing bilaterally.  ABDOMEN: soft, nontender, no rebound or guarding, bowel sounds noted throughout abdomen GU:no cva tenderness NEURO: Pt is awake/alert/appropriate, mild weakness noted to left upper and left lower extremity which is chronic and baseline for patient. EXTREMITIES: pulses normal/equal, full ROM SKIN: warm, color normal PSYCH: no abnormalities of mood noted, alert and oriented to situation  ED Treatments / Results  Labs (all labs ordered are listed, but only abnormal results are displayed) Labs Reviewed  BASIC METABOLIC PANEL - Abnormal; Notable for the  following:       Result Value   Chloride 99 (*)    CO2 33 (*)    Glucose, Bld 118 (*)    Creatinine, Ser 1.25 (*)    GFR calc non Af Amer 56 (*)    All other components within normal limits  PROTIME-INR - Abnormal; Notable for the following:    Prothrombin Time 22.8 (*)    All other components within normal limits  URINE CULTURE  CBC WITH DIFFERENTIAL/PLATELET  URINALYSIS, ROUTINE W REFLEX MICROSCOPIC (NOT AT [facility])  I-STAT CG4 LACTIC ACID, ED    EKG  EKG Interpretation  Date/Time:  Wednesday  December 12 2015 00:20:00 EDT Ventricular Rate:  83 PR Interval:    QRS Duration: 97 QT Interval:  380 QTC Calculation: 447 R Axis:   -53 Text Interpretation:  Sinus rhythm Left anterior fascicular block Abnormal R-wave progression, early transition Left ventricular hypertrophy Nonspecific T abnormalities, lateral leads No significant change since last tracing Confirmed by Christy Gentles  MD, Darnette Lampron ([id number]) on 12/12/2015 12:23:30 AM       Radiology Dg Chest Portable 1 View  Result Date: 12/12/2015 CLINICAL DATA:  Acute onset of shortness of breath. Initial encounter. EXAM: PORTABLE CHEST 1 VIEW COMPARISON:  Chest radiograph performed 10/22/2015 FINDINGS: The lungs are well-aerated. There is elevation of the right hemidiaphragm. Vascular congestion is noted. Right basilar atelectasis is noted. There is no evidence of pleural effusion or pneumothorax. The cardiomediastinal silhouette is borderline normal in size. The patient is status post median sternotomy. No acute osseous abnormalities are seen. IMPRESSION: Elevation of the right hemidiaphragm. Vascular congestion noted. Right basilar atelectasis seen. Electronically Signed   By: Garald Balding M.D.   On: 12/12/2015 00:27    Procedures Procedures (including critical care time) CRITICAL CARE Performed by: Sharyon Cable Total critical care time: 33 minutes Critical care time was exclusive of separately billable procedures and treating  other patients. Critical care was necessary to treat or prevent imminent or life-threatening deterioration. Critical care was time spent personally by me on the following activities: development of treatment plan with patient and/or surrogate as well as nursing, discussions with consultants, evaluation of patient's response to treatment, examination of patient, obtaining history from patient or surrogate, ordering and performing treatments and interventions, ordering and review of laboratory studies, ordering and review of radiographic studies, pulse oximetry and re-evaluation of patient's condition. PATIENT RECEIVED MULTIPLE NEBULIZED TREATMENTS AND STILL HAS HYPOXIA WITH AMBULATION, WILL REQUIRE ADMISSION  DIAGNOSTIC STUDIES: Oxygen Saturation is 98% on RA, normal by my interpretation.    COORDINATION OF CARE: 12:14 AM Discussed treatment plan with pt at bedside and pt agreed to plan.  Medications Ordered in ED Medications  albuterol (PROVENTIL) (2.5 MG/3ML) 0.083% nebulizer solution 5 mg (not administered)  ipratropium-albuterol (DUONEB) 0.5-2.5 (3) MG/3ML nebulizer solution 3 mL (3 mLs Nebulization Given 12/12/15 0113)  acetaminophen (TYLENOL) tablet 650 mg (650 mg Oral Given 12/12/15 0111)  predniSONE (DELTASONE) tablet 60 mg (60 mg Oral Given 12/12/15 0326)  albuterol (PROVENTIL,VENTOLIN) solution continuous neb (10 mg/hr Nebulization Given 12/12/15 0201)  sodium chloride 0.9 % bolus 500 mL (0 mLs Intravenous Stopped 12/12/15 0435)     Initial Impression / Assessment and Plan / ED Course  I have reviewed the triage vital signs and the nursing notes.  Pertinent labs & imaging results that were available during my care of the patient were reviewed by me and considered in my medical decision making (see chart for details).  Clinical Course    Pt reportedly starting taking flexeril for back pain and has been very drowsy and having difficulty getting around since taking medications He has  appeared confused No new worsening of chronic left sided weakness He does have wheezing here.  He has had cough as well Neb treatments in process at this time 5:38 AM AFTER NEB TREATMENTS PT STILL HYPOXIC ON AMBULATION PATIENT WITH PROUCTIVE COUGH, WHEEZING, WILL NEED ADMISSION NO H/O ASTHMA/COPD THOUGH COULD BE TRIGGERED BY VIRAL ILLNESS OR EVEN EARLY PNEUMONIA NOT APPARENT ON CXR D/W DR [facility] FOR ADMISSION  I personally performed the services described in this documentation, which was scribed in my presence.  The recorded information has been reviewed and is accurate.     Final Clinical Impressions(s) / ED Diagnoses   Final diagnoses:  Hypoxia  Wheezing    New Prescriptions New Prescriptions   No medications on file     Ripley Fraise, MD 12/12/15 [phone]

## 2015-12-12 NOTE — Progress Notes (Signed)
Patient trasfered from ED to [phone] via stretcher; alert and oriented x 4; complaints of pain  In his lower back (chronic pain); IV saline locked in RFA. Orient patient to room and unit; gave patient care guide; instructed how to use the call bell and  fall risk precautions. Droplet precaution initiated. Will continue to monitor the patient.

## 2015-12-12 NOTE — ED Notes (Signed)
Patient attempted to urinate in urinal. Reports he is unable to. Refuses in and out cath.

## 2015-12-12 NOTE — Progress Notes (Addendum)
PROGRESS NOTE  [name] DOB: 1945-09-26 DOA: 12/11/2015 PCP: [name]., NP  Brief History:  70 year old male with a history of CAD, diabetes mellitus type 2, paroxysmal fibrillation, diastolic CHF, stroke with left sided deficit presented with generalized weakness and fatigue. On 12/09/2015, the patient apparently fell off a stool 18 inches off the ground resulting in worsening back pain. The patient visited the emergency department on 12/10/2015. The patient was discharged with additional prescription for Flexeril in addition to his tramadol. Since that period of time, the patient has had increasing lethargy and generalized weakness. Upon initial evaluation, the patient was not able to provide any history due to his somnolence. In addition, the patient has also had coughing, shortness breath for the past 2-3 days. Apparently, ambulation was performed in the emergency department and the patient desaturated to 83%.  Assessment/Plan: Acute toxic encephalopathy -Likely secondary to the patient's hypnotic medication resulting in hypercarbia--7.36/52/67/29 on 2 L -There is some improvement on the following morning 12/12/2015, but patient is clearly not back to baseline -minimize opioids and hypnotics -Urinalysis -UDS -TSH -B12 -ammonia  Acute hypoxic and hypercarbic respiratory failure -In part due to the patient's toxic encephalopathy -Viral respiratory panel -Continue bronchodilators -Procalcitonin 0.14 -lactic acid 1.05 -Chest x-ray without distinct infiltrate--personally reviewed  Paroxsymal atrial fibrillation -presently in sinus -continue amiodarone -continue warfarin -CHADS-VASc = 7  Diabetes mellitus -SSI -HbA1c -d/c metformin for now  CAD with hx of DES -no chest pain presently -personally reviewed EKG--sinus with nonspecific ST-T changes -continue lipitor  HEpEF -04/26/2015 echo EF 50-55%, moderate TR, no WMA -clinically  euvolemic -continue lasix and benazepril  Low Back pain -12/08/2015 MRI lumbar spine--L3-4 moderate to moderately severe canal stenosis without nerve impingement -Patient will have to reschedule neurosurgical appointment -no bowel or bladder incontinence  Hypothyroidism -continue synthroid -TSH   Disposition Plan:   Home 9/28 if clinically improved Family Communication:   No Family at bedside--Total time spent 35 minutes.  Greater than 50% spent face to face counseling and coordinating care. 1310-->1345   Consultants:  none  Code Status:  FULL  DVT Prophylaxis:  warfarin    Procedures: As Listed in Progress Note Above  Antibiotics: None    Subjective: Patient complains of some shortness of breath. He has a nonproductive cough without hemoptysis. Denies any fevers, chills, chest pain, nausea, vomiting, diarrhea. No abdominal pain. No dysuria or hematuria.  Objective: Vitals:   12/12/15 0730 12/12/15 0800 12/12/15 0845 12/12/15 0930  BP: 137/73 (!) 116/53 128/69 129/76  Pulse: 67 71 69 62  Resp: 17 16 18 21   Temp:      TempSrc:      SpO2: 100% 98% 99% 100%    Intake/Output Summary (Last 24 hours) at 12/12/15 1331 Last data filed at 12/12/15 0435  Gross per 24 hour  Intake              500 ml  Output                0 ml  Net              500 ml   Weight change:  Exam:   General:  Pt is alert, follows commands appropriately, not in acute distress  HEENT: No icterus, No thrush, No neck mass, [place]/AT; no meningismus  Cardiovascular: RRR, S1/S2, no rubs, no gallops  Respiratory: Bibasilar expiratory wheeze. Bibasilar rales, right greater than left  Abdomen: Soft/+BS, non  tender, non distended, no guarding  Extremities: No edema, No lymphangitis, No petechiae, No rashes, no synovitis   Data Reviewed: I have personally reviewed following labs and imaging studies Basic Metabolic Panel:  Recent Labs Lab 12/12/15 0037  NA 137  K 4.5  CL 99*  CO2 33*    GLUCOSE 118*  BUN 18  CREATININE 1.25*  CALCIUM 8.9   Liver Function Tests: No results for input(s): AST, ALT, ALKPHOS, BILITOT, PROT, ALBUMIN in the last 168 hours. No results for input(s): LIPASE, AMYLASE in the last 168 hours. No results for input(s): AMMONIA in the last 168 hours. Coagulation Profile:  Recent Labs Lab 12/12/15 0421  INR 1.98   CBC:  Recent Labs Lab 12/12/15 0037  WBC 6.4  NEUTROABS 4.2  HGB 13.8  HCT 43.4  MCV 94.6  PLT 164   Cardiac Enzymes: No results for input(s): CKTOTAL, CKMB, CKMBINDEX, TROPONINI in the last 168 hours. BNP: Invalid input(s): POCBNP CBG: No results for input(s): GLUCAP in the last 168 hours. HbA1C: No results for input(s): HGBA1C in the last 72 hours. Urine analysis:    Component Value Date/Time   COLORURINE YELLOW 11/25/2014 1806   APPEARANCEUR CLEAR 11/25/2014 1806   LABSPEC 1.025 11/25/2014 1806   PHURINE 6.0 11/25/2014 1806   GLUCOSEU NEGATIVE 11/25/2014 1806   HGBUR NEGATIVE 11/25/2014 1806   BILIRUBINUR NEGATIVE 11/25/2014 1806   BILIRUBINUR Pos 08/16/2014 1655   KETONESUR NEGATIVE 11/25/2014 1806   PROTEINUR NEGATIVE 11/25/2014 1806   UROBILINOGEN 1.0 11/25/2014 1806   NITRITE NEGATIVE 11/25/2014 1806   LEUKOCYTESUR NEGATIVE 11/25/2014 1806   Sepsis Labs: @LABRCNTIP (procalcitonin:4,lacticidven:4) )No results found for this or any previous visit (from the past 240 hour(s)).   Scheduled Meds: . albuterol  2.5 mg Nebulization Q6H  . allopurinol  200 mg Oral Daily  . amiodarone  100 mg Oral Daily  . atorvastatin  80 mg Oral q1800  . benazepril  10 mg Oral Daily  . [START ON 12/13/2015] furosemide  40 mg Oral QODAY  . insulin aspart  0-5 Units Subcutaneous QHS  . insulin aspart  0-9 Units Subcutaneous TID WC  . ipratropium  0.5 mg Nebulization Q6H  . isosorbide mononitrate  15 mg Oral Daily  . levothyroxine  112 mcg Oral QAC breakfast  . pantoprazole  40 mg Oral Daily  . predniSONE  50 mg Oral Q  breakfast  . traMADol  50 mg Oral BID  . traMADol  200 mg Oral Daily  . warfarin  3 mg Oral ONCE-1800  . Warfarin - Pharmacist Dosing Inpatient   Does not apply q1800   Continuous Infusions:   Procedures/Studies: Dg Lumbar Spine 2-3 Views  Result Date: 12/05/2015 CLINICAL DATA:  Lumbago radiating into left hip for 3 days EXAM: LUMBAR SPINE - 2-3 VIEW COMPARISON:  July 01, 2010 FINDINGS: Frontal, lateral, and spot lumbosacral lateral images were obtained. There are 5 non-rib-bearing lumbar type vertebral bodies. There is no demonstrable fracture. There is stable minimal retrolisthesis of L1 on L2. There is no new spondylolisthesis. There is stable disc space narrowing, mild, at L5-S1. There are anterior osteophytes at all levels. Schmorl's nodes are noted at L3 and L4. No erosive change. There is bony overgrowth posteriorly at L4-5 and L5-S1, stable. Scattered foci of abdominal aortic calcification noted. IMPRESSION: Stable multifocal osteoarthritic change. Minimal retrolisthesis of L1 on L2 is stable and felt to be due to underlying spondylosis. No acute fracture. No new spondylolisthesis. There is aortic atherosclerosis. Electronically Signed  By: Lowella Grip III M.D.   On: 12/05/2015 09:42   Mr Lumbar Spine Wo Contrast  Result Date: 12/08/2015 CLINICAL DATA:  Low back and left hip and leg pain since using a riding lawnmower 2 weeks ago. EXAM: MRI LUMBAR SPINE WITHOUT CONTRAST TECHNIQUE: Multiplanar, multisequence MR imaging of the lumbar spine was performed. No intravenous contrast was administered. COMPARISON:  Plain films lumbar spine 12/05/2015. FINDINGS: Segmentation:  Unremarkable. Alignment:  Maintained with exaggeration of lumbar lordosis noted. Vertebrae: Mild, remote anterior superior endplate compression fracture of L1 is seen. Scattered Schmorl's nodes are noted. Marrow signal is unremarkable. Conus medullaris: Extends to the L1-2 level and appears normal. Paraspinal and other  soft tissues: Unremarkable. Disc levels: T12-L1: Shallow disc bulge and endplate spur to the left without central canal or foraminal stenosis. L1-2: Mild to moderate facet degenerative change and a minimal disc bulge without central canal or foraminal stenosis. L2-3: Moderate facet arthropathy, shallow disc bulge and ligamentum flavum thickening are seen. There is moderate central canal stenosis. The foramina are open. L3-4: Advanced bilateral facet degenerative change and a shallow disc bulge are seen. There is moderate to moderately severe central canal stenosis and narrowing of the subarticular recesses, greater on the right. The foramina are open. L4-5: Advanced bilateral facet degenerative change and a shallow disc bulge are identified. Mild central canal narrowing is present with narrowing of the subarticular recesses but no nerve root compression. The foramina are open. L5-S1: Advanced bilateral facet degenerative disease. Minimal disc bulge without central canal or foraminal stenosis. IMPRESSION: Multilevel spondylosis. Central canal narrowing appears worst at L3-4 where there is moderate to moderately severe central canal stenosis with narrowing of both lateral recesses. No nerve root compression is identified. Multilevel facet arthropathy is also identified. Please see above for descriptions of individual levels. Mild anterior and remote superior endplate compression fracture of L1. Electronically Signed   By: Inge Rise M.D.   On: 12/08/2015 13:49   Dg Chest Portable 1 View  Result Date: 12/12/2015 CLINICAL DATA:  Acute onset of shortness of breath. Initial encounter. EXAM: PORTABLE CHEST 1 VIEW COMPARISON:  Chest radiograph performed 10/22/2015 FINDINGS: The lungs are well-aerated. There is elevation of the right hemidiaphragm. Vascular congestion is noted. Right basilar atelectasis is noted. There is no evidence of pleural effusion or pneumothorax. The cardiomediastinal silhouette is  borderline normal in size. The patient is status post median sternotomy. No acute osseous abnormalities are seen. IMPRESSION: Elevation of the right hemidiaphragm. Vascular congestion noted. Right basilar atelectasis seen. Electronically Signed   By: Garald Balding M.D.   On: 12/12/2015 00:27   Dg Hip Unilat With Pelvis 1v Left  Result Date: 12/05/2015 CLINICAL DATA:  Pain EXAM: DG HIP (WITH OR WITHOUT PELVIS) 2-3V*L* COMPARISON:  None. FINDINGS: Frontal pelvis as well as frontal and lateral left hip images were obtained. There is no acute fracture or dislocation. There is symmetric osteoarthritic change in both hip joints. No erosive change. Osteoarthritic changes also noted in each sacroiliac joint. IMPRESSION: Symmetric narrowing both hip joints. Osteoarthritic change also noted in the sacroiliac joints. No acute fracture or dislocation. Electronically Signed   By: Lowella Grip III M.D.   On: 12/05/2015 09:44    Emrik Erhard, DO  [facility] Pager [phone]  If 7PM-7AM, please contact night-coverage [email or web address] Password [email or web address] 12/12/2015, 1:31 PM   LOS: 0 days

## 2015-12-12 NOTE — ED Notes (Signed)
Pt. Refusing In-and-out cath at this time.

## 2015-12-12 NOTE — ED Notes (Signed)
[name] contact info, daughter, [phone]

## 2015-12-12 NOTE — Progress Notes (Signed)
ANTICOAGULATION CONSULT NOTE - Initial Consult  Pharmacy Consult for Coumadin Indication: atrial fibrillation  No Known Allergies   Vital Signs: Temp: 100.2 F (37.9 C) (09/27 0000) Temp Source: Oral (09/27 0000) BP: 148/67 (09/27 0700) Pulse Rate: 70 (09/27 0700)  Labs:  Recent Labs  12/12/15 0037 12/12/15 0421  HGB 13.8  --   HCT 43.4  --   PLT 164  --   LABPROT  --  22.8*  INR  --  1.98  CREATININE 1.25*  --     Estimated Creatinine Clearance: 72.8 mL/min (by C-G formula based on SCr of 1.25 mg/dL (H)).   Medical History: Past Medical History:  Diagnosis Date  . CAD (coronary artery disease)    s/p CABG, s/p DES to LCX  January 2011  . Chronic pain    left sided-[place]  . CVA (cerebral infarction)    left hemiparesis  . Diastolic CHF ([place])   . Dysrhythmia    hx of atrial fibrilation with cardioversion  . GERD (gastroesophageal reflux disease)   . Gout   . Hyperlipidemia   . Hypertension   . Hypothyroidism   . OSA on CPAP   . Sleep apnea   . Stroke ([place])   . Tubular adenoma of colon 05/2010     Assessment: 70yo male admitted for hypoxia and wheezing to continue Coumadin for PAF; current INR just below goal with last dose of Coumadin taken 9/26.  Of note pt states he takes Coumadin 2.5mg  daily but per anticoag clinic notes from 9/5 pt should be taking 1.25mg  TTSS and 2.5mg  MWF.  Goal of Therapy:  INR 2-3   Plan:  Will give slightly boosted Coumadin dose of 3mg  today and monitor INR for dose adjustments.  Wynona Neat, PharmD, BCPS  12/12/2015,7:09 AM

## 2015-12-12 NOTE — H&P (Signed)
History and Physical  Patient Name: [name]     [id number]    DOB: [birth year]    DOA: 12/11/2015 PCP: [name]., NP   Patient coming from: Home  Chief Complaint: Weakness/fatigue  HPI: [name] is a 70 y.o. male with a past medical history significant for OSA not on CPAP, CAD s/p CABG and PCI, NIDDM, pAF on warfarin, hypothyroidism, HFpEF, and stroke with residual L deficits who presents with weakness/fatigue for a day superimposed on low back pain and found incidentally to have wheezing and hypoxia.  History is collected largely from the chart as the patient is mostly too sleepy on my exam to answer questions.  Evidently, about a week ago he fell off a stool and had worsening of his chronic low back pain. He saw his PCP who ordered an MRI of the lumbar spine, which showed some lumbar stenosis, for which he was referred urgently to a neurosurgeon.  Yesterday, he presented to the ER requesting pain relief, was prescribed Flexeril on top of his daily tramadol, and discharged.  Tonight, the patient returned to the ER for "constant generalized weakness". He has unfortunately not able to tell me on admission what brought him to the ER. He is able to endorse recently worse cough, sputum production and wheezing. He is able to deny fever, chills, rigors, hemoptysis.  ED course: -Temp 100.2 F, pulse rate, respirations, and blood pressure normal -Na 137, K 4.5, Cr 1.25 (baseline 1.1-1.2), WBC 6.4 K, Hgb 13.8, lactate 1.05, INR subtherapeutic -Chest x-ray without focal opacity -ECG showed sinus rhythm, LAFB, nonspecific T-wave changes, similar to previous -The patient was very wheezy and given prednisone and nebulized bronchodilators -He subsequently saturated normally on room air at rest, but with ambulation desaturated to 83% and TRH were asked to evaluate for admission    ROS: Review of Systems  Constitutional: Negative for chills and fever.  Respiratory: Positive  for cough, sputum production and wheezing. Negative for hemoptysis.   Cardiovascular: Negative for chest pain, orthopnea, leg swelling and PND.  Neurological: Positive for weakness.  All other systems reviewed and are negative.         Past Medical History:  Diagnosis Date  . CAD (coronary artery disease)    s/p CABG, s/p DES to LCX  January 2011  . Chronic pain    left sided-[place]  . CVA (cerebral infarction)    left hemiparesis  . Diastolic CHF ([place])   . Dysrhythmia    hx of atrial fibrilation with cardioversion  . GERD (gastroesophageal reflux disease)   . Gout   . Hyperlipidemia   . Hypertension   . Hypothyroidism   . OSA on CPAP   . Sleep apnea   . Stroke ([place])   . Tubular adenoma of colon 05/2010    Past Surgical History:  Procedure Laterality Date  . CARDIAC CATHETERIZATION N/A 10/25/2015   Procedure: Left Heart Cath and Cors/Grafts Angiography;  Surgeon: Troy Sine, MD;  Location: [facility];  Service: Cardiovascular;  Laterality: N/A;  . CARDIOVERSION  06/20/2011   Procedure: CARDIOVERSION;  Surgeon: Larey Dresser, MD;  Location: [facility] ENDOSCOPY;  Service: Cardiovascular;  Laterality: N/A;  . CARDIOVERSION N/A 04/26/2015   Procedure: CARDIOVERSION;  Surgeon: Dorothy Spark, MD;  Location: [facility] ENDOSCOPY;  Service: Cardiovascular;  Laterality: N/A;  . CARDIOVERSION N/A 05/10/2015   Procedure: CARDIOVERSION;  Surgeon: Larey Dresser, MD;  Location: [place];  Service: Cardiovascular;  Laterality: N/A;  .  CATARACT EXTRACTION  05/2010   left eye  . CATARACT EXTRACTION  04/2010   right eye  . CORONARY ARTERY BYPASS GRAFT     stent  . ESOPHAGOGASTRODUODENOSCOPY  03-25-2005  . NASAL SEPTUM SURGERY    . PERCUTANEOUS PLACEMENT INTRAVASCULAR STENT CERVICAL CAROTID ARTERY     03-2009; using a drug-eluting platform of the circumflex cornoray artery with a 3.0 x 18 [employer] Promus drug-eluting platform post dilated to 3.75 with a noncompliant balloon.    . TEE WITHOUT CARDIOVERSION  06/20/2011   Procedure: TRANSESOPHAGEAL ECHOCARDIOGRAM (TEE);  Surgeon: Larey Dresser, MD;  Location: [place];  Service: Cardiovascular;  Laterality: N/A;    Social History: Patient lives with his wife.  The patient walks unassisted.  He can walk about 200 yards at baseline he states.  He is from [place], worked in a [place].  He is a former smoker.    No Known Allergies  Family history: family history includes Lung cancer in his father; Stroke in his mother.  Prior to Admission medications   Medication Sig Start Date End Date Taking? Authorizing Provider  allopurinol (ZYLOPRIM) 100 MG tablet TAKE 2 TABLETS BY MOUTH DAILY. D/C PREVIOUS SCRIPTS FOR THIS MEDICATION 10/22/15  Yes Debbrah Alar, NP  amiodarone (PACERONE) 100 MG tablet Take 1 tablet (100 mg total) by mouth daily. 11/05/15  Yes Scott T Kathlen Mody, PA-C  atorvastatin (LIPITOR) 40 MG tablet Take two tablets ( 80 mg total ) by mouth daily   Yes Historical Provider, MD  benazepril (LOTENSIN) 10 MG tablet Take 10 mg by mouth daily.   Yes Historical Provider, MD  cyclobenzaprine (FLEXERIL) 10 MG tablet Take 1 tablet (10 mg total) by mouth 2 (two) times daily as needed for muscle spasms. 12/10/15  Yes Jessica L Focht, PA  fluticasone (FLONASE) 50 MCG/ACT nasal spray Place 2 sprays into both nostrils daily. 06/02/14  Yes Brunetta Jeans, PA-C  furosemide (LASIX) 40 MG tablet Take 1 tablet (40 mg total) by mouth every other day. 09/11/15  Yes Debbrah Alar, NP  isosorbide mononitrate (IMDUR) 30 MG 24 hr tablet Take 0.5 tablets (15 mg total) by mouth daily. 11/05/15  Yes Scott T Kathlen Mody, PA-C  levothyroxine (SYNTHROID, LEVOTHROID) 112 MCG tablet TAKE 1 TABLET (112 MCG TOTAL) BY MOUTH DAILY BEFORE BREAKFAST. 06/04/15  Yes Debbrah Alar, NP  metFORMIN (GLUCOPHAGE) 500 MG tablet Take by mouth daily with breakfast.   Yes Historical Provider, MD  Multiple Vitamins-Minerals (MULTIVITAMIN GUMMIES  MENS PO) Take 1 tablet by mouth daily.   Yes Historical Provider, MD  nitroGLYCERIN (NITROSTAT) 0.4 MG SL tablet Place 1 tablet (0.4 mg total) under the tongue every 5 (five) minutes as needed for chest pain. Up to 3 doses 03/05/15  Yes Debbrah Alar, NP  pantoprazole (PROTONIX) 40 MG tablet Take 1 tablet (40 mg total) by mouth daily. 11/20/15  Yes Debbrah Alar, NP  traMADol (ULTRAM) 50 MG tablet Take 1 tablet (50 mg total) by mouth 2 (two) times daily. 07/03/15  Yes Charlett Blake, MD  traMADol (ULTRAM-ER) 200 MG 24 hr tablet TAKE 1 TABLET EVERY DAY WITH AM DOSE OF TRAMADOL 50 MG 07/03/15  Yes Charlett Blake, MD  vitamin B-12 1000 MCG tablet Take 1 tablet (1,000 mcg total) by mouth daily. 08/11/13  Yes Orson Eva, MD  warfarin (COUMADIN) 2.5 MG tablet Take warfarin 2.5mg  tonight, Saturday and Sunday and recheck INR in clinic Monday Patient taking differently: Take 2.5 mg by mouth daily.  10/26/15  Yes Bhavinkumar Bhagat, PA  predniSONE (DELTASONE) 10 MG tablet 5 tab po day 1, 4 tab po day 2, 3 tab po day 3, 2 tab po day 4, 1 tab po day 5 Patient not taking: Reported on 12/12/2015 12/05/15   Mackie Pai, PA-C  tizanidine (ZANAFLEX) 2 MG capsule 1 tab po q hs as needed muscle spasms Patient not taking: Reported on 12/12/2015 12/05/15   Mackie Pai, PA-C  warfarin (COUMADIN) 2.5 MG tablet TAKE AS DIRECTED BY ANTICOAGULATION CLINIC Patient not taking: Reported on 12/12/2015 12/10/15   Larey Dresser, MD       Physical Exam: BP 128/71   Pulse 69   Temp 100.2 F (37.9 C) (Oral)   Resp 17   SpO2 95%  General appearance: Well-developed, older adult male, very sleepy, drifts back off to sleep mid-sentence and in no acute distress.   Eyes: Anicteric, conjunctiva pink, lids and lashes normal. PERRL.    ENT: No nasal deformity, discharge, epistaxis.  Hearing normal. OP moist without lesions.   Neck: No neck masses.  Trachea midline.  No thyromegaly/tenderness. Lymph: No cervical or  supraclavicular lymphadenopathy. Skin: Warm and dry.  No jaundice.  No suspicious rashes or lesions. Cardiac: RRR, nl S1-S2, no murmurs appreciated.  Capillary refill is brisk.  JVP normal.  No LE edema.  Radial pulses 2+ and symmetric.  DP pulses diminished Respiratory: Normal respiratory rate and rhythm.  No rales.  Coarse cough.  Wheezes bilaterally with expiration. Abdomen: Abdomen soft.  No TTP or guarding. No ascites, distension, hepatosplenomegaly.   MSK: No deformities or effusions.  No cyanosis or clubbing. Neuro: Cranial nerves normal.  Sensation intact to light touch. Speech is fluent.  Muscle strength globally weak, hard to assess given sleepiness.    Psych: Sensorium intact and responding to questions, attention diminished.  Behavior appropriate.  Affect sleepy.  Judgment and insight appear normal.     Labs on Admission:  I have personally reviewed following labs and imaging studies: CBC:  Recent Labs Lab 12/12/15 0037  WBC 6.4  NEUTROABS 4.2  HGB 13.8  HCT 43.4  MCV 94.6  PLT 123456   Basic Metabolic Panel:  Recent Labs Lab 12/12/15 0037  NA 137  K 4.5  CL 99*  CO2 33*  GLUCOSE 118*  BUN 18  CREATININE 1.25*  CALCIUM 8.9   GFR: Estimated Creatinine Clearance: 72.8 mL/min (by C-G formula based on SCr of 1.25 mg/dL (H)).  Liver Function Tests: No results for input(s): AST, ALT, ALKPHOS, BILITOT, PROT, ALBUMIN in the last 168 hours. No results for input(s): LIPASE, AMYLASE in the last 168 hours. No results for input(s): AMMONIA in the last 168 hours. Coagulation Profile:  Recent Labs Lab 12/12/15 0421  INR 1.98   Cardiac Enzymes: No results for input(s): CKTOTAL, CKMB, CKMBINDEX, TROPONINI in the last 168 hours. BNP (last 3 results) No results for input(s): PROBNP in the last 8760 hours. HbA1C: No results for input(s): HGBA1C in the last 72 hours. CBG: No results for input(s): GLUCAP in the last 168 hours. Lipid Profile: No results for input(s):  CHOL, HDL, LDLCALC, TRIG, CHOLHDL, LDLDIRECT in the last 72 hours. Thyroid Function Tests: No results for input(s): TSH, T4TOTAL, FREET4, T3FREE, THYROIDAB in the last 72 hours. Anemia Panel: No results for input(s): VITAMINB12, FOLATE, FERRITIN, TIBC, IRON, RETICCTPCT in the last 72 hours. Sepsis Labs: Lactic acid 1.05 Invalid input(s): PROCALCITONIN, LACTICIDVEN No results found for this or any previous visit (from the past 240 hour(s)).  Radiological Exams on Admission: Personally reviewed CXR shows no focal opacity or pneumonia: Dg Chest Portable 1 View  Result Date: 12/12/2015 CLINICAL DATA:  Acute onset of shortness of breath. Initial encounter. EXAM: PORTABLE CHEST 1 VIEW COMPARISON:  Chest radiograph performed 10/22/2015 FINDINGS: The lungs are well-aerated. There is elevation of the right hemidiaphragm. Vascular congestion is noted. Right basilar atelectasis is noted. There is no evidence of pleural effusion or pneumothorax. The cardiomediastinal silhouette is borderline normal in size. The patient is status post median sternotomy. No acute osseous abnormalities are seen. IMPRESSION: Elevation of the right hemidiaphragm. Vascular congestion noted. Right basilar atelectasis seen. Electronically Signed   By: Garald Balding M.D.   On: 12/12/2015 00:27    EKG: Independently reviewed. Rate 83, QTc 447, LAFB, anterior TW changes, similar to previous.    Assessment/Plan 1. Hypoxia and wheezing:  No previous diagnosis of COPD or asthma, although has allergic rhinitis and is on nasal steroid. No previously prescribed inhalers that he can tell me. History of sleep apnea, failed CPAP.   -Check ABG -Prednisone 50 mg for 5 days -Albuterol-per troponin scheduled -Albuterol when necessary   2. HTN:  Normotensive of admission. -Continue benazepril and Imdur -Continue furosemide every other day-continue atorvastatin  3. Paroxysmal atrial fibrillation:  CHADS2-VASc 7. On amiodarone  and warfarin. INR subtherapeutic. -Restart warfarin -Continue amiodarone  4. NIDDM:  -Hold metformin -Sliding scale  5. Chronic diastolic CHF:  Appears euvolemic. -Continue furosemide and ACE inhibitor  6. Hypothyroidism:  -Continue levothyroxine  7. Other medications:  -Continue allopurinol -Continue PPI  8. Lumbar stenosis: Unfortunately, because the patient is in the hospital he will miss his neurosurgery appointment today. -Continue tramadol long-acting and short-acting -Hold Flexeril given sleepiness     DVT prophylaxis: On warfarin  Code Status: Full  Family Communication: None present at the bedside  Disposition Plan: Anticipate bronchodilators and prednisone, likely discharge within 24 hours. Regarding low back pain, patient will have to reschedule his neurosurgery appointment. Avoid Flexeril. Consults called: None Admission status: Observation, medical surgical At the point of initial evaluation, it is my clinical opinion that admission for OBSERVATION is reasonable and necessary because the patient's presenting complaints in the context of their chronic conditions represent sufficient risk of deterioration or significant morbidity to constitute reasonable grounds for close observation in the hospital setting, but that the patient may be medically stable for discharge from the hospital within 24 to 48 hours.    Medical decision making: Patient seen at 5:45 AM on 12/12/2015.  The patient was discussed with Dr Christy Gentles.  What exists of the patient's chart was reviewed in depth and summarized above.  Clinical condition: stable.        Edwin Dada [facility] Pager [phone]

## 2015-12-12 NOTE — ED Notes (Signed)
Attempted to collect urine from pt. Pt declined

## 2015-12-12 NOTE — ED Notes (Signed)
Portable chest x-ray at bedside at this time. 

## 2015-12-12 NOTE — ED Notes (Signed)
Admitting physician at bedside at this time.

## 2015-12-13 DIAGNOSIS — G9340 Encephalopathy, unspecified: Secondary | ICD-10-CM | POA: Diagnosis not present

## 2015-12-13 DIAGNOSIS — J9601 Acute respiratory failure with hypoxia: Secondary | ICD-10-CM | POA: Diagnosis not present

## 2015-12-13 DIAGNOSIS — G92 Toxic encephalopathy: Secondary | ICD-10-CM | POA: Diagnosis not present

## 2015-12-13 DIAGNOSIS — J9602 Acute respiratory failure with hypercapnia: Secondary | ICD-10-CM | POA: Diagnosis not present

## 2015-12-13 DIAGNOSIS — I10 Essential (primary) hypertension: Secondary | ICD-10-CM | POA: Diagnosis not present

## 2015-12-13 LAB — BASIC METABOLIC PANEL
Anion gap: 6 (ref 5–15)
BUN: 22 mg/dL — AB (ref 6–20)
CHLORIDE: 101 mmol/L (ref 101–111)
CO2: 29 mmol/L (ref 22–32)
CREATININE: 1.07 mg/dL (ref 0.61–1.24)
Calcium: 9 mg/dL (ref 8.9–10.3)
GFR calc Af Amer: >60 mL/min (ref >60)
GFR calc non Af Amer: >60 mL/min (ref >60)
Glucose, Bld: 158 mg/dL — ABNORMAL HIGH (ref 65–99)
Potassium: 4.8 mmol/L (ref 3.5–5.1)
Sodium: 136 mmol/L (ref 135–145)

## 2015-12-13 LAB — RESPIRATORY PANEL BY PCR
Adenovirus: NOT DETECTED
BORDETELLA PERTUSSIS-RVPCR: NOT DETECTED
CORONAVIRUS 229E-RVPPCR: NOT DETECTED
Chlamydophila pneumoniae: NOT DETECTED
Coronavirus HKU1: NOT DETECTED
Coronavirus NL63: NOT DETECTED
Coronavirus OC43: NOT DETECTED
INFLUENZA B-RVPPCR: NOT DETECTED
Influenza A: NOT DETECTED
METAPNEUMOVIRUS-RVPPCR: NOT DETECTED
MYCOPLASMA PNEUMONIAE-RVPPCR: NOT DETECTED
PARAINFLUENZA VIRUS 2-RVPPCR: NOT DETECTED
Parainfluenza Virus 1: NOT DETECTED
Parainfluenza Virus 3: DETECTED — AB
Parainfluenza Virus 4: NOT DETECTED
RESPIRATORY SYNCYTIAL VIRUS-RVPPCR: NOT DETECTED
Rhinovirus / Enterovirus: NOT DETECTED

## 2015-12-13 LAB — URINE CULTURE

## 2015-12-13 LAB — GLUCOSE, CAPILLARY
Glucose-Capillary: 145 mg/dL — ABNORMAL HIGH (ref 65–99)
Glucose-Capillary: 166 mg/dL — ABNORMAL HIGH (ref 65–99)

## 2015-12-13 LAB — CBC
HEMATOCRIT: 39.8 % (ref 39.0–52.0)
HEMOGLOBIN: 12.5 g/dL — AB (ref 13.0–17.0)
MCH: 29.4 pg (ref 26.0–34.0)
MCHC: 31.4 g/dL (ref 30.0–36.0)
MCV: 93.6 fL (ref 78.0–100.0)
Platelets: 177 10*3/uL (ref 150–400)
RBC: 4.25 MIL/uL (ref 4.22–5.81)
RDW: 13.8 % (ref 11.5–15.5)
WBC: 11.5 10*3/uL — ABNORMAL HIGH (ref 4.0–10.5)

## 2015-12-13 LAB — PROTIME-INR
INR: 1.86
Prothrombin Time: 21.7 seconds — ABNORMAL HIGH (ref 11.4–15.2)

## 2015-12-13 MED ORDER — WARFARIN SODIUM 3 MG PO TABS
3.0000 mg | ORAL_TABLET | Freq: Once | ORAL | Status: AC
  Administered 2015-12-13: 3 mg via ORAL
  Filled 2015-12-13 (×2): qty 1

## 2015-12-13 NOTE — Discharge Summary (Signed)
Physician Discharge Summary  [name] [id number] DOB: 1945-04-14 DOA: 12/11/2015  PCP: [name]., NP  Admit date: 12/11/2015 Discharge date: 12/13/2015  Admitted From:  Home Disposition:   Home  Recommendations for Outpatient Follow-up:  1. Follow up with PCP in 1-2 weeks 2. Please obtain BMP/CBC in one week 3. Please obtain INR on 9/29 or 12/17/15--pt received coumadin 3 mg on 9/27 and 9/28 4. Follow up with Dr. Cyndy Freeze on 12/20/15  Home Health:No Equipment/Devices:N/A  Discharge Condition: stable CODE STATUS:FULL Diet recommendation: Heart Healthy   Brief/Interim Summary: 70 year old male with a history of CAD, diabetes mellitus type 2, paroxysmal fibrillation, diastolic CHF, stroke with left sided deficit presented with generalized weakness and fatigue. On 12/09/2015, the patient apparently fell off a stool 18 inches off the ground resulting in worsening back pain. The patient visited the emergency department on 12/10/2015. The patient was discharged with additional prescription for Flexeril in addition to his tramadol. Since that period of time, the patient has had increasing lethargy and generalized weakness. Upon initial evaluation, the patient was not able to provide any history due to his somnolence. In addition, the patient has also had coughing, shortness breath for the past 2-3 days. Apparently, The patient had oxygen desaturation down to 83% in the emergency department.  His tramadol dose was reduced, and his Flexeril was discontinued. Over the next 24 hours, the patient's mental status gradually improved. Fortunately, his hypoxia improved with improvement of his mental status.  On the day of discharge, the patient's mental status was near baseline according to his wife.  Discharge Diagnoses:  Acute toxic encephalopathy -Likely secondary to the patient's hypnotic medication resulting in hypercarbia--7.36/52/67/29 on 2 L -There is some improvement on the  following morning 12/12/2015, but patient is clearly not back to baseline -minimize opioids and hypnotics -Urinalysis--negative for pyuria -UDS-negative -TSH--0.516 -B12--448 -ammonia--16 -12/13/2015--patient's mental status no baseline  Acute hypercarbic respiratory failure/Acute on chronic hypoxic respiratory failure -In part due to the patient's toxic encephalopathy--once the patient's encephalopathy improved, his hypercarbia and hypoxia improved -Viral respiratory panel-->+parainfluenza 3 viurs -Continue bronchodilators -Procalcitonin 0.14 -lactic acid 1.05 -Chest x-ray without distinct infiltrate -pt is normally on 2 L [place] at night  Acute bronchitis -Secondary to parainfluenza 3 virus -Temperature maximum 100.59F -Remained hemodynamically stable -No indication for antibiotics  Paroxsymal atrial fibrillation -presently in sinus -continue amiodarone -continue warfarin -CHADS-VASc = 7  Diabetes mellitus -SSI -d/c metformin for now--restart after discharge  CAD with hx of DES -no chest pain presently -personally reviewed EKG--sinus with nonspecific ST-T changes -continue lipitor  HEpEF -04/26/2015 echo EF 50-55%, moderate TR, no WMA -clinically euvolemic -continue lasix and benazepril  Low Back pain -12/08/2015 MRI lumbar spine--L3-4 moderate to moderately severe canal stenosis without nerve impingement -Patient will have to reschedule neurosurgical appointment--reschedule to 12/20/2015 -no bowel or bladder incontinence  Hypothyroidism -continue synthroid -TSH--0.516   Discharge Instructions  Discharge Instructions    Diet - low sodium heart healthy    Complete by:  As directed    Increase activity slowly    Complete by:  As directed        Medication List    STOP taking these medications   cyclobenzaprine 10 MG tablet Commonly known as:  FLEXERIL   tizanidine 2 MG capsule Commonly known as:  ZANAFLEX     TAKE these medications     allopurinol 100 MG tablet Commonly known as:  ZYLOPRIM TAKE 2 TABLETS BY MOUTH DAILY. D/C PREVIOUS SCRIPTS FOR THIS MEDICATION  amiodarone 100 MG tablet Commonly known as:  PACERONE Take 1 tablet (100 mg total) by mouth daily.   atorvastatin 40 MG tablet Commonly known as:  LIPITOR Take two tablets ( 80 mg total ) by mouth daily   benazepril 10 MG tablet Commonly known as:  LOTENSIN Take 10 mg by mouth daily.   cyanocobalamin 1000 MCG tablet Take 1 tablet (1,000 mcg total) by mouth daily.   fluticasone 50 MCG/ACT nasal spray Commonly known as:  FLONASE Place 2 sprays into both nostrils daily.   furosemide 40 MG tablet Commonly known as:  LASIX Take 1 tablet (40 mg total) by mouth every other day.   isosorbide mononitrate 30 MG 24 hr tablet Commonly known as:  IMDUR Take 0.5 tablets (15 mg total) by mouth daily.   levothyroxine 112 MCG tablet Commonly known as:  SYNTHROID, LEVOTHROID TAKE 1 TABLET (112 MCG TOTAL) BY MOUTH DAILY BEFORE BREAKFAST.   metFORMIN 500 MG tablet Commonly known as:  GLUCOPHAGE Take by mouth daily with breakfast.   MULTIVITAMIN GUMMIES MENS PO Take 1 tablet by mouth daily.   nitroGLYCERIN 0.4 MG SL tablet Commonly known as:  NITROSTAT Place 1 tablet (0.4 mg total) under the tongue every 5 (five) minutes as needed for chest pain. Up to 3 doses   pantoprazole 40 MG tablet Commonly known as:  PROTONIX Take 1 tablet (40 mg total) by mouth daily.   traMADol 200 MG 24 hr tablet Commonly known as:  ULTRAM-ER TAKE 1 TABLET EVERY DAY WITH AM DOSE OF TRAMADOL 50 MG   traMADol 50 MG tablet Commonly known as:  ULTRAM Take 1 tablet (50 mg total) by mouth 2 (two) times daily.   warfarin 2.5 MG tablet Commonly known as:  COUMADIN Take warfarin 2.5mg  tonight, Saturday and Sunday and recheck INR in clinic Monday What changed:  how much to take  how to take this  when to take this  additional instructions       No Known  Allergies  Consultations:  none   Procedures/Studies: Dg Lumbar Spine 2-3 Views  Result Date: 12/05/2015 CLINICAL DATA:  Lumbago radiating into left hip for 3 days EXAM: LUMBAR SPINE - 2-3 VIEW COMPARISON:  July 01, 2010 FINDINGS: Frontal, lateral, and spot lumbosacral lateral images were obtained. There are 5 non-rib-bearing lumbar type vertebral bodies. There is no demonstrable fracture. There is stable minimal retrolisthesis of L1 on L2. There is no new spondylolisthesis. There is stable disc space narrowing, mild, at L5-S1. There are anterior osteophytes at all levels. Schmorl's nodes are noted at L3 and L4. No erosive change. There is bony overgrowth posteriorly at L4-5 and L5-S1, stable. Scattered foci of abdominal aortic calcification noted. IMPRESSION: Stable multifocal osteoarthritic change. Minimal retrolisthesis of L1 on L2 is stable and felt to be due to underlying spondylosis. No acute fracture. No new spondylolisthesis. There is aortic atherosclerosis. Electronically Signed   By: Lowella Grip III M.D.   On: 12/05/2015 09:42   Mr Lumbar Spine Wo Contrast  Result Date: 12/08/2015 CLINICAL DATA:  Low back and left hip and leg pain since using a riding lawnmower 2 weeks ago. EXAM: MRI LUMBAR SPINE WITHOUT CONTRAST TECHNIQUE: Multiplanar, multisequence MR imaging of the lumbar spine was performed. No intravenous contrast was administered. COMPARISON:  Plain films lumbar spine 12/05/2015. FINDINGS: Segmentation:  Unremarkable. Alignment:  Maintained with exaggeration of lumbar lordosis noted. Vertebrae: Mild, remote anterior superior endplate compression fracture of L1 is seen. Scattered Schmorl's nodes are noted. Marrow signal  is unremarkable. Conus medullaris: Extends to the L1-2 level and appears normal. Paraspinal and other soft tissues: Unremarkable. Disc levels: T12-L1: Shallow disc bulge and endplate spur to the left without central canal or foraminal stenosis. L1-2: Mild to  moderate facet degenerative change and a minimal disc bulge without central canal or foraminal stenosis. L2-3: Moderate facet arthropathy, shallow disc bulge and ligamentum flavum thickening are seen. There is moderate central canal stenosis. The foramina are open. L3-4: Advanced bilateral facet degenerative change and a shallow disc bulge are seen. There is moderate to moderately severe central canal stenosis and narrowing of the subarticular recesses, greater on the right. The foramina are open. L4-5: Advanced bilateral facet degenerative change and a shallow disc bulge are identified. Mild central canal narrowing is present with narrowing of the subarticular recesses but no nerve root compression. The foramina are open. L5-S1: Advanced bilateral facet degenerative disease. Minimal disc bulge without central canal or foraminal stenosis. IMPRESSION: Multilevel spondylosis. Central canal narrowing appears worst at L3-4 where there is moderate to moderately severe central canal stenosis with narrowing of both lateral recesses. No nerve root compression is identified. Multilevel facet arthropathy is also identified. Please see above for descriptions of individual levels. Mild anterior and remote superior endplate compression fracture of L1. Electronically Signed   By: Inge Rise M.D.   On: 12/08/2015 13:49   Dg Chest Portable 1 View  Result Date: 12/12/2015 CLINICAL DATA:  Acute onset of shortness of breath. Initial encounter. EXAM: PORTABLE CHEST 1 VIEW COMPARISON:  Chest radiograph performed 10/22/2015 FINDINGS: The lungs are well-aerated. There is elevation of the right hemidiaphragm. Vascular congestion is noted. Right basilar atelectasis is noted. There is no evidence of pleural effusion or pneumothorax. The cardiomediastinal silhouette is borderline normal in size. The patient is status post median sternotomy. No acute osseous abnormalities are seen. IMPRESSION: Elevation of the right hemidiaphragm.  Vascular congestion noted. Right basilar atelectasis seen. Electronically Signed   By: Garald Balding M.D.   On: 12/12/2015 00:27   Dg Hip Unilat With Pelvis 1v Left  Result Date: 12/05/2015 CLINICAL DATA:  Pain EXAM: DG HIP (WITH OR WITHOUT PELVIS) 2-3V*L* COMPARISON:  None. FINDINGS: Frontal pelvis as well as frontal and lateral left hip images were obtained. There is no acute fracture or dislocation. There is symmetric osteoarthritic change in both hip joints. No erosive change. Osteoarthritic changes also noted in each sacroiliac joint. IMPRESSION: Symmetric narrowing both hip joints. Osteoarthritic change also noted in the sacroiliac joints. No acute fracture or dislocation. Electronically Signed   By: Lowella Grip III M.D.   On: 12/05/2015 09:44         Discharge Exam: Vitals:   12/13/15 0754 12/13/15 0911  BP:  124/60  Pulse: 74 75  Resp: 17   Temp:     Vitals:   12/12/15 2159 12/13/15 0500 12/13/15 0754 12/13/15 0911  BP: (!) 110/47 120/67  124/60  Pulse: 70 73 74 75  Resp: 17 17 17    Temp: 98.8 F (37.1 C) 98.6 F (37 C)    TempSrc: Oral Oral    SpO2: 98% 98% 98%     General: Pt is alert, awake, not in acute distress Cardiovascular: RRR, S1/S2 +, no rubs, no gallops Respiratory: CTA bilaterally, no wheezing, no rhonchi Abdominal: Soft, NT, ND, bowel sounds + Extremities: no edema, no cyanosis   The results of significant diagnostics from this hospitalization (including imaging, microbiology, ancillary and laboratory) are listed below for reference.  Significant Diagnostic Studies: Dg Lumbar Spine 2-3 Views  Result Date: 12/05/2015 CLINICAL DATA:  Lumbago radiating into left hip for 3 days EXAM: LUMBAR SPINE - 2-3 VIEW COMPARISON:  July 01, 2010 FINDINGS: Frontal, lateral, and spot lumbosacral lateral images were obtained. There are 5 non-rib-bearing lumbar type vertebral bodies. There is no demonstrable fracture. There is stable minimal retrolisthesis of  L1 on L2. There is no new spondylolisthesis. There is stable disc space narrowing, mild, at L5-S1. There are anterior osteophytes at all levels. Schmorl's nodes are noted at L3 and L4. No erosive change. There is bony overgrowth posteriorly at L4-5 and L5-S1, stable. Scattered foci of abdominal aortic calcification noted. IMPRESSION: Stable multifocal osteoarthritic change. Minimal retrolisthesis of L1 on L2 is stable and felt to be due to underlying spondylosis. No acute fracture. No new spondylolisthesis. There is aortic atherosclerosis. Electronically Signed   By: Lowella Grip III M.D.   On: 12/05/2015 09:42   Mr Lumbar Spine Wo Contrast  Result Date: 12/08/2015 CLINICAL DATA:  Low back and left hip and leg pain since using a riding lawnmower 2 weeks ago. EXAM: MRI LUMBAR SPINE WITHOUT CONTRAST TECHNIQUE: Multiplanar, multisequence MR imaging of the lumbar spine was performed. No intravenous contrast was administered. COMPARISON:  Plain films lumbar spine 12/05/2015. FINDINGS: Segmentation:  Unremarkable. Alignment:  Maintained with exaggeration of lumbar lordosis noted. Vertebrae: Mild, remote anterior superior endplate compression fracture of L1 is seen. Scattered Schmorl's nodes are noted. Marrow signal is unremarkable. Conus medullaris: Extends to the L1-2 level and appears normal. Paraspinal and other soft tissues: Unremarkable. Disc levels: T12-L1: Shallow disc bulge and endplate spur to the left without central canal or foraminal stenosis. L1-2: Mild to moderate facet degenerative change and a minimal disc bulge without central canal or foraminal stenosis. L2-3: Moderate facet arthropathy, shallow disc bulge and ligamentum flavum thickening are seen. There is moderate central canal stenosis. The foramina are open. L3-4: Advanced bilateral facet degenerative change and a shallow disc bulge are seen. There is moderate to moderately severe central canal stenosis and narrowing of the subarticular  recesses, greater on the right. The foramina are open. L4-5: Advanced bilateral facet degenerative change and a shallow disc bulge are identified. Mild central canal narrowing is present with narrowing of the subarticular recesses but no nerve root compression. The foramina are open. L5-S1: Advanced bilateral facet degenerative disease. Minimal disc bulge without central canal or foraminal stenosis. IMPRESSION: Multilevel spondylosis. Central canal narrowing appears worst at L3-4 where there is moderate to moderately severe central canal stenosis with narrowing of both lateral recesses. No nerve root compression is identified. Multilevel facet arthropathy is also identified. Please see above for descriptions of individual levels. Mild anterior and remote superior endplate compression fracture of L1. Electronically Signed   By: Inge Rise M.D.   On: 12/08/2015 13:49   Dg Chest Portable 1 View  Result Date: 12/12/2015 CLINICAL DATA:  Acute onset of shortness of breath. Initial encounter. EXAM: PORTABLE CHEST 1 VIEW COMPARISON:  Chest radiograph performed 10/22/2015 FINDINGS: The lungs are well-aerated. There is elevation of the right hemidiaphragm. Vascular congestion is noted. Right basilar atelectasis is noted. There is no evidence of pleural effusion or pneumothorax. The cardiomediastinal silhouette is borderline normal in size. The patient is status post median sternotomy. No acute osseous abnormalities are seen. IMPRESSION: Elevation of the right hemidiaphragm. Vascular congestion noted. Right basilar atelectasis seen. Electronically Signed   By: Garald Balding M.D.   On: 12/12/2015 00:27   Dg  Hip Unilat With Pelvis 1v Left  Result Date: 12/05/2015 CLINICAL DATA:  Pain EXAM: DG HIP (WITH OR WITHOUT PELVIS) 2-3V*L* COMPARISON:  None. FINDINGS: Frontal pelvis as well as frontal and lateral left hip images were obtained. There is no acute fracture or dislocation. There is symmetric osteoarthritic  change in both hip joints. No erosive change. Osteoarthritic changes also noted in each sacroiliac joint. IMPRESSION: Symmetric narrowing both hip joints. Osteoarthritic change also noted in the sacroiliac joints. No acute fracture or dislocation. Electronically Signed   By: Lowella Grip III M.D.   On: 12/05/2015 09:44     Microbiology: Recent Results (from the past 240 hour(s))  Urine culture     Status: Abnormal   Collection Time: 12/12/15  3:24 PM  Result Value Ref Range Status   Specimen Description URINE, CLEAN CATCH  Final   Special Requests NONE  Final   Culture MULTIPLE SPECIES PRESENT, SUGGEST RECOLLECTION (A)  Final   Report Status 12/13/2015 FINAL  Final  Respiratory Panel by PCR     Status: Abnormal   Collection Time: 12/12/15  5:09 PM  Result Value Ref Range Status   Adenovirus NOT DETECTED NOT DETECTED Final   Coronavirus 229E NOT DETECTED NOT DETECTED Final   Coronavirus HKU1 NOT DETECTED NOT DETECTED Final   Coronavirus NL63 NOT DETECTED NOT DETECTED Final   Coronavirus OC43 NOT DETECTED NOT DETECTED Final   Metapneumovirus NOT DETECTED NOT DETECTED Final   Rhinovirus / Enterovirus NOT DETECTED NOT DETECTED Final   Influenza A NOT DETECTED NOT DETECTED Final   Influenza B NOT DETECTED NOT DETECTED Final   Parainfluenza Virus 1 NOT DETECTED NOT DETECTED Final   Parainfluenza Virus 2 NOT DETECTED NOT DETECTED Final   Parainfluenza Virus 3 DETECTED (A) NOT DETECTED Final   Parainfluenza Virus 4 NOT DETECTED NOT DETECTED Final   Respiratory Syncytial Virus NOT DETECTED NOT DETECTED Final   Bordetella pertussis NOT DETECTED NOT DETECTED Final   Chlamydophila pneumoniae NOT DETECTED NOT DETECTED Final   Mycoplasma pneumoniae NOT DETECTED NOT DETECTED Final     Labs: Basic Metabolic Panel:  Recent Labs Lab 12/12/15 0037 12/13/15 0438  NA 137 136  K 4.5 4.8  CL 99* 101  CO2 33* 29  GLUCOSE 118* 158*  BUN 18 22*  CREATININE 1.25* 1.07  CALCIUM 8.9 9.0    Liver Function Tests: No results for input(s): AST, ALT, ALKPHOS, BILITOT, PROT, ALBUMIN in the last 168 hours. No results for input(s): LIPASE, AMYLASE in the last 168 hours.  Recent Labs Lab 12/12/15 1430  AMMONIA 16   CBC:  Recent Labs Lab 12/12/15 0037 12/13/15 0438  WBC 6.4 11.5*  NEUTROABS 4.2  --   HGB 13.8 12.5*  HCT 43.4 39.8  MCV 94.6 93.6  PLT 164 177   Cardiac Enzymes: No results for input(s): CKTOTAL, CKMB, CKMBINDEX, TROPONINI in the last 168 hours. BNP: Invalid input(s): POCBNP CBG:  Recent Labs Lab 12/12/15 1748 12/12/15 2200 12/13/15 0727 12/13/15 1144  GLUCAP 239* 204* 145* 166*    Time coordinating discharge:  Greater than 30 minutes  Signed:  Ranyia Witting, DO [facility] Pager: [phone] 12/13/2015, 6:39 PM

## 2015-12-13 NOTE — Progress Notes (Signed)
[name] to be D/C'd to home per MD order.  Discussed with the patient and all questions fully answered.  VSS, Skin clean, dry and intact without evidence of skin break down, no evidence of skin tears noted. IV catheter discontinued intact. Site without signs and symptoms of complications. Dressing and pressure applied.  An After Visit Summary was printed and given to the patient.  D/c education completed with patient/family including follow up instructions, medication list, d/c activities limitations if indicated, with other d/c instructions as indicated by MD - patient able to verbalize understanding, all questions fully answered.   Patient instructed to return to ED, call 911, or call MD for any changes in condition.   Patient escorted via [place], and D/C home via private auto.  Morley Kos Price 12/13/2015 2:16 PM

## 2015-12-13 NOTE — Progress Notes (Signed)
ANTICOAGULATION CONSULT NOTE - [name] for Coumadin Indication: atrial fibrillation  No Known Allergies   Vital Signs: Temp: 98.6 F (37 C) (09/28 0500) Temp Source: Oral (09/28 0500) BP: 124/60 (09/28 0911) Pulse Rate: 75 (09/28 0911)  Labs:  Recent Labs  12/12/15 0037 12/12/15 0421 12/13/15 0438  HGB 13.8  --  12.5*  HCT 43.4  --  39.8  PLT 164  --  177  LABPROT  --  22.8* 21.7*  INR  --  1.98 1.86  CREATININE 1.25*  --  1.07    Estimated Creatinine Clearance: 85 mL/min (by C-G formula based on SCr of 1.07 mg/dL).   Medical History: Past Medical History:  Diagnosis Date  . CAD (coronary artery disease)    s/p CABG, s/p DES to LCX  January 2011  . Chronic pain    left sided-[place]  . CVA (cerebral infarction)    left hemiparesis  . Diastolic CHF ([place])   . Dysrhythmia    hx of atrial fibrilation with cardioversion  . GERD (gastroesophageal reflux disease)   . Gout   . Hyperlipidemia   . Hypertension   . Hypothyroidism   . OSA on CPAP   . Sleep apnea   . Stroke ([place])   . Tubular adenoma of colon 05/2010     Assessment: 70yo male admitted for hypoxia and wheezing to continue Coumadin for PAF; current INR remains below goal.  No bleeding issues noted.  Goal of Therapy:  INR 2-3   Plan:  Will repeat Coumadin dose of 3mg  today and monitor INR for dose adjustments.  [employer], Pharm.D., BCPS Clinical Pharmacist Pager [phone] 12/13/2015 11:15 AM

## 2015-12-13 NOTE — Care Management Note (Signed)
Case Management Note  Patient Details  Name: [name] MRN: [id number] Date of Birth: Apr 07, 1945  Subjective/Objective:                 Patient in obs from home for acute resp failure. Patient has home O2. Lives with spouse. Likely DC today   Action/Plan:  No CM needs identified at this time.  Expected Discharge Date:                  Expected Discharge Plan:  Home/Self Care  In-House Referral:  NA  Discharge planning Services  CM Consult  Post Acute Care Choice:  NA Choice offered to:  NA  DME Arranged:  N/A DME Agency:  NA  HH Arranged:  NA HH Agency:  NA  Status of Service:  Completed, signed off  If discussed at [place] of Stay Meetings, dates discussed:    Additional Comments:  Carles Collet, RN 12/13/2015, 10:34 AM

## 2015-12-14 ENCOUNTER — Ambulatory Visit (INDEPENDENT_AMBULATORY_CARE_PROVIDER_SITE_OTHER): Payer: Medicare Other | Admitting: *Deleted

## 2015-12-14 ENCOUNTER — Telehealth: Payer: Self-pay | Admitting: Family

## 2015-12-14 DIAGNOSIS — Z7901 Long term (current) use of anticoagulants: Secondary | ICD-10-CM | POA: Diagnosis not present

## 2015-12-14 DIAGNOSIS — I4891 Unspecified atrial fibrillation: Secondary | ICD-10-CM

## 2015-12-14 LAB — POCT INR: INR: 2.8

## 2015-12-14 NOTE — Telephone Encounter (Signed)
Please contact pt to arrange TCM follow up.  

## 2015-12-14 NOTE — Telephone Encounter (Signed)
Noted  

## 2015-12-14 NOTE — Telephone Encounter (Signed)
Transition Care Management Follow-up Telephone Call  Per Discharge Summary: PCP: [name]., NP  Admit date: 12/11/2015 Discharge date: 12/13/2015  Admitted From:  Home Disposition:   Home  Recommendations for Outpatient Follow-up:  1. Follow up with PCP in 1-2 weeks 2. Please obtain BMP/CBC in one week 3. Please obtain INR on 9/29 or 12/17/15--pt received coumadin 3 mg on 9/27 and 9/28 4. Follow up with Dr. Cyndy Freeze on 12/20/15  Home Health:No Equipment/Devices:N/A  Discharge Condition: stable CODE STATUS:FULL Diet recommendation: Heart Healthy   --   How have you been since you were released from the hospital? "I'm still not right. I have a virus." Pt w/ parainfluenza 3. States he was told to rest for a few weeks while he recovers and he is doing just that. States feeling some better overall, but still not back to baseline.   Do you understand why you were in the hospital? yes   Do you understand the discharge instructions? yes   Where were you discharged to? Home   Items Reviewed:  Medications reviewed: yes  Allergies reviewed: yes  Dietary changes reviewed: yes, heart healthy  Referrals reviewed: no, Dr. Cyndy Freeze, neurosurgery. Has appt next week.   Functional Questionnaire:  Activities of Daily Living (ADLs):   He states they are independent in the following: ambulation, bathing and hygiene, feeding, continence, grooming, toileting and dressing States they require assistance with the following: none   Any transportation issues/concerns?: no   Any patient concerns? no   Confirmed importance and date/time of follow-up visits scheduled yes  Provider Appointment booked with Debbrah Alar, NP 12/24/15 @ 10:30am  Confirmed with patient if condition begins to worsen call PCP or go to the ER.  Patient was given the office number and encouraged to call back with question or concerns.  : yes

## 2015-12-15 ENCOUNTER — Other Ambulatory Visit (HOSPITAL_BASED_OUTPATIENT_CLINIC_OR_DEPARTMENT_OTHER): Payer: Medicare Other

## 2015-12-19 ENCOUNTER — Encounter: Payer: Self-pay | Admitting: Cardiology

## 2015-12-19 ENCOUNTER — Ambulatory Visit (INDEPENDENT_AMBULATORY_CARE_PROVIDER_SITE_OTHER): Payer: Medicare Other | Admitting: Cardiology

## 2015-12-19 VITALS — BP 126/72 | HR 89 | Ht 75.0 in | Wt 251.0 lb

## 2015-12-19 DIAGNOSIS — I4891 Unspecified atrial fibrillation: Secondary | ICD-10-CM | POA: Diagnosis not present

## 2015-12-19 DIAGNOSIS — I208 Other forms of angina pectoris: Secondary | ICD-10-CM | POA: Diagnosis not present

## 2015-12-19 DIAGNOSIS — I209 Angina pectoris, unspecified: Secondary | ICD-10-CM | POA: Diagnosis not present

## 2015-12-19 DIAGNOSIS — I25119 Atherosclerotic heart disease of native coronary artery with unspecified angina pectoris: Secondary | ICD-10-CM | POA: Diagnosis not present

## 2015-12-19 DIAGNOSIS — I10 Essential (primary) hypertension: Secondary | ICD-10-CM

## 2015-12-19 MED ORDER — DOXYCYCLINE HYCLATE 100 MG PO CAPS: 100.0000 mg | ORAL_CAPSULE | Freq: Two times a day (BID) | ORAL | 0 refills | Status: DC

## 2015-12-19 NOTE — Patient Instructions (Addendum)
Medication Instructions:  Start Doxycycline 100mg  two times a day for 1 week.  Labwork: None  Testing/Procedures: none  Follow-Up: Your physician recommends that you schedule a follow-up appointment in:  4 months with [name], PA       If you need a refill on your cardiac medications before your next appointment, please call your pharmacy.

## 2015-12-20 NOTE — Progress Notes (Addendum)
Patient ID: [name], male   DOB: 08-May-1945, 70 y.o.   MRN: [id number]  PCP: Inda Castle  70 yo with history of CAD s/p CABG and paroxysmal atrial fibrillation with history of prior CVA returns for followup.  He was admitted in 8/17 with chest pain.  LHC showed patent LIMA-LAD, SVG-D1, and SVG-om2.  Occluded SVG-AM. Medical management.  He was admitted again in 9/17 with oversedation/hypercarbia.  Today, he is in NSR.  He has residual gait difficulty because of his stroke. No chest pain, no dyspnea (though not very active), no bendopnea/orthopnea/PND.  He has not felt his heart racing.  He does not get much exercise.  Doing ok with warfarin, no BRBPR or melena.  For the last 1.5 weeks, he has had a cough productive of green sputum.  No fever.    Labs (4/13): K 5, creatinine 1.11, LDL 32, HDL 30 Labs (8/13): HCT 39.1, TSH normal Labs (7/14): LDL 43, HDL 46 Labs (8/14): K 4.4, creatinine 1.19 Labs (9/14): TSH normal, HCT 41.9 Labs (5/15): LDL 43, HDL 40 Labs (1/16): K 3.6, creatinine 1.10 Labs (8/17): LDL 75, HDL 42, TSH normal Labs (9/17): K 4.8, creatinine 1.07, hgb 12.5, BNP 54, AST 39, ALT 61  ECG: NSR, LAFB, LVH, nonspecific T wave flattening  PMH: 1. GERD 2. Gout 3. Hypothyroidism 4. HTN 5. H/o CVA: some residual left-sided weakness.  6. Borderline DM2 7. CAD s/p CABG in 2004.  LHC in 2011: SVG-RCA and SVG-OM totally occluded.  LIMA-LAD patent.  Patient had DES to CFX, flow down native RCA was ok.  Lexiscan myoview (8/13): EF 62%, small mild fixed inferior defect with no ischemia.  Echo (9/14) with EF 55-60%, grade II diastolic dysfunction, PA systolic pressure 35 mmHg.   - Echo (2/17) with EF 50-55%, mild LVH, moderate TR.  - LHC (8/17) with 90% ostial LAD, patent LIMA-LAD, nondominant RCA, patent SVG-D1 and SVG-OM2, total occlusion SVG-AM.  8. OSA: Intolerant of CPAP, wears oxygen at night.  9. Paroxysmal atrial fibrillation. TEE (4/13) with EF 55-60%, no significant  valvular abnormalities.  10. 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  Nonsmoker.  Lives in [place].    FH: No premature CAD.   ROS: All systems reviewed and negative except as per HPI.   Current Outpatient Prescriptions  Medication Sig Dispense Refill  . allopurinol (ZYLOPRIM) 100 MG tablet TAKE 2 TABLETS BY MOUTH DAILY. D/C PREVIOUS SCRIPTS FOR THIS MEDICATION 180 tablet 1  . amiodarone (PACERONE) 100 MG tablet Take 1 tablet (100 mg total) by mouth daily. 30 tablet 6  . atorvastatin (LIPITOR) 40 MG tablet Take two tablets ( 80 mg total ) by mouth daily    . benazepril (LOTENSIN) 10 MG tablet Take 10 mg by mouth daily.    . fluticasone (FLONASE) 50 MCG/ACT nasal spray Place 2 sprays into both nostrils daily. 9.9 g 1  . furosemide (LASIX) 40 MG tablet Take 1 tablet (40 mg total) by mouth every other day. 45 tablet 1  . isosorbide mononitrate (IMDUR) 30 MG 24 hr tablet Take 0.5 tablets (15 mg total) by mouth daily. 30 tablet 6  . levothyroxine (SYNTHROID, LEVOTHROID) 112 MCG tablet TAKE 1 TABLET (112 MCG TOTAL) BY MOUTH DAILY BEFORE BREAKFAST. 90 tablet 0  . metFORMIN (GLUCOPHAGE) 500 MG tablet Take by mouth daily with breakfast.    . Multiple Vitamins-Minerals (MULTIVITAMIN GUMMIES MENS PO) Take 1 tablet by mouth  daily.    . nitroGLYCERIN (NITROSTAT) 0.4 MG SL tablet Place 1 tablet (0.4 mg total) under the tongue every 5 (five) minutes as needed for chest pain. Up to 3 doses 10 tablet 2  . pantoprazole (PROTONIX) 40 MG tablet Take 1 tablet (40 mg total) by mouth daily. 90 tablet 1  . traMADol (ULTRAM) 50 MG tablet Take 1 tablet (50 mg total) by mouth 2 (two) times daily. 180 tablet 1  . traMADol (ULTRAM-ER) 200 MG 24 hr tablet TAKE 1 TABLET EVERY DAY WITH AM DOSE OF TRAMADOL 50 MG 90 tablet 1  . vitamin B-12 1000 MCG tablet Take 1 tablet (1,000 mcg total) by mouth daily. 30 tablet 0  .  warfarin (COUMADIN) 2.5 MG tablet Take warfarin 2.5mg  tonight, Saturday and Sunday and recheck INR in clinic Monday (Patient taking differently: Take 2.5 mg by mouth daily. ) 30 tablet 0  . doxycycline (VIBRAMYCIN) 100 MG capsule Take 1 capsule (100 mg total) by mouth 2 (two) times daily. 14 capsule 0   No current facility-administered medications for this visit.     BP 126/72   Pulse 89   Ht 6\' 3"  (1.905 m)   Wt 251 lb (113.9 kg)   BMI 31.37 kg/m  General: NAD, overweight Neck: Thick, no JVD, no thyromegaly or thyroid nodule.  Lungs: Clear to auscultation bilaterally with normal respiratory effort. CV: Nondisplaced PMI.  Heart regular S1/S2, no S3/S4, no murmur.  No edema.  No carotid bruit.  Normal pedal pulses.  Abdomen: Soft, nontender, no hepatosplenomegaly, no distention.  Neurologic: Alert and oriented x 3.  Psych: Normal affect. Extremities: No clubbing or cyanosis.   Assessment/Plan:  HYPERLIPIDEMIA  LDL at goal in 8/17 (< 70).   CAD No chest pain or exertional dyspnea. Cardiac cath earlier this year with no interventional target. Continue ASA 81, statin, benazepril.  Atrial fibrillation  Paroxysmal atrial fibrillation. He is now on amiodarone to maintain NSR.  - Continue warfarin. - Continue amiodarone 100 mg daily.  TSH normal in 8/17 but LFTs mildly elevated. Need repeat LFTs in the next couple of weeks.  Should get regular eye exam with amiodarone use.  Acute bronchitis 1.5 weeks of productive cough.  Given the persistence of his symptoms, I will give him a 1 week course of doxycycline 100 mg bid.  Lungs are clear.   Followup in 4 months with PA Kathlen Mody and after that with Dr End given my transition to CHF clinic.   [name] 12/20/2015

## 2015-12-21 ENCOUNTER — Other Ambulatory Visit: Payer: Self-pay | Admitting: Cardiology

## 2015-12-21 DIAGNOSIS — I25119 Atherosclerotic heart disease of native coronary artery with unspecified angina pectoris: Secondary | ICD-10-CM

## 2015-12-21 MED ORDER — AMIODARONE HCL 100 MG PO TABS: 100.0000 mg | ORAL_TABLET | Freq: Every day | ORAL | 3 refills | Status: DC

## 2015-12-24 ENCOUNTER — Encounter: Payer: Self-pay | Admitting: Family

## 2015-12-24 ENCOUNTER — Ambulatory Visit (INDEPENDENT_AMBULATORY_CARE_PROVIDER_SITE_OTHER): Payer: Medicare Other | Admitting: Cardiovascular Disease

## 2015-12-24 ENCOUNTER — Telehealth: Payer: Self-pay | Admitting: Family

## 2015-12-24 ENCOUNTER — Ambulatory Visit (INDEPENDENT_AMBULATORY_CARE_PROVIDER_SITE_OTHER): Payer: Medicare Other | Admitting: Family

## 2015-12-24 VITALS — BP 127/71 | HR 66 | Temp 98.2°F | Resp 16 | Ht 75.0 in | Wt 249.6 lb

## 2015-12-24 DIAGNOSIS — E118 Type 2 diabetes mellitus with unspecified complications: Secondary | ICD-10-CM

## 2015-12-24 DIAGNOSIS — Z8709 Personal history of other diseases of the respiratory system: Secondary | ICD-10-CM | POA: Diagnosis not present

## 2015-12-24 DIAGNOSIS — J9602 Acute respiratory failure with hypercapnia: Principal | ICD-10-CM

## 2015-12-24 DIAGNOSIS — Z7901 Long term (current) use of anticoagulants: Secondary | ICD-10-CM

## 2015-12-24 DIAGNOSIS — Z09 Encounter for follow-up examination after completed treatment for conditions other than malignant neoplasm: Secondary | ICD-10-CM

## 2015-12-24 DIAGNOSIS — Z8661 Personal history of infections of the central nervous system: Secondary | ICD-10-CM

## 2015-12-24 DIAGNOSIS — G9340 Encephalopathy, unspecified: Secondary | ICD-10-CM

## 2015-12-24 DIAGNOSIS — D72829 Elevated white blood cell count, unspecified: Secondary | ICD-10-CM

## 2015-12-24 DIAGNOSIS — E119 Type 2 diabetes mellitus without complications: Secondary | ICD-10-CM

## 2015-12-24 DIAGNOSIS — J9601 Acute respiratory failure with hypoxia: Secondary | ICD-10-CM

## 2015-12-24 LAB — CBC WITH DIFFERENTIAL/PLATELET
BASOS PCT: 0.7 % (ref 0.0–3.0)
Basophils Absolute: 0 10*3/uL (ref 0.0–0.1)
EOS PCT: 1.6 % (ref 0.0–5.0)
Eosinophils Absolute: 0.1 10*3/uL (ref 0.0–0.7)
HCT: 40.5 % (ref 39.0–52.0)
Hemoglobin: 13.4 g/dL (ref 13.0–17.0)
LYMPHS ABS: 1.9 10*3/uL (ref 0.7–4.0)
Lymphocytes Relative: 29.2 % (ref 12.0–46.0)
MCHC: 33.1 g/dL (ref 30.0–36.0)
MCV: 89.8 fl (ref 78.0–100.0)
MONO ABS: 0.5 10*3/uL (ref 0.1–1.0)
MONOS PCT: 7.7 % (ref 3.0–12.0)
NEUTROS ABS: 4 10*3/uL (ref 1.4–7.7)
NEUTROS PCT: 60.8 % (ref 43.0–77.0)
PLATELETS: 194 10*3/uL (ref 150.0–400.0)
RBC: 4.51 Mil/uL (ref 4.22–5.81)
RDW: 14.5 % (ref 11.5–15.5)
WBC: 6.6 10*3/uL (ref 4.0–10.5)

## 2015-12-24 LAB — COMPREHENSIVE METABOLIC PANEL
ALT: 39 U/L (ref 0–53)
AST: 26 U/L (ref 0–37)
Albumin: 3.7 g/dL (ref 3.5–5.2)
Alkaline Phosphatase: 59 U/L (ref 39–117)
BUN: 17 mg/dL (ref 6–23)
CHLORIDE: 102 meq/L (ref 96–112)
CO2: 32 meq/L (ref 19–32)
Calcium: 9.6 mg/dL (ref 8.4–10.5)
Creatinine, Ser: 1.1 mg/dL (ref 0.40–1.50)
GFR: 69.82 mL/min (ref >60.00)
GLUCOSE: 124 mg/dL — AB (ref 70–99)
POTASSIUM: 4.1 meq/L (ref 3.5–5.1)
SODIUM: 140 meq/L (ref 135–145)
Total Bilirubin: 0.6 mg/dL (ref 0.2–1.2)
Total Protein: 7.1 g/dL (ref 6.0–8.3)

## 2015-12-24 LAB — POCT INR: INR: 3.9

## 2015-12-24 NOTE — Patient Instructions (Addendum)
Restart Metformin. Do not take any further coumadin until instructed by the coumadin clinic. Complete blood work prior to leaving.

## 2015-12-24 NOTE — Assessment & Plan Note (Addendum)
Resolved.  He has returned to his baseline.  Continue HS oxygen. He was noted to have a few crackles at the right lung base.  He had some atelectasis on his cxr and is improved clinically. We discussed CXR today and he does not wish to complete chest x ray. Clinically I really doubt PNA at this point given his clinical improvement.

## 2015-12-24 NOTE — Telephone Encounter (Signed)
Ms. [name], [name] got confused about his coumadin clinic appointment. I checked his INR today and it is 3.1.  I have advised him not to take any coumadin tonight and to wait to hear from the coumadin clinic for further instructions and follow up.  Would you please follow up with the patient per clinic protocol? Thank you.

## 2015-12-24 NOTE — Assessment & Plan Note (Signed)
Clinically stable, continue metformin. Obtain follow up CMET and CBC.

## 2015-12-24 NOTE — Progress Notes (Signed)
Pre visit review using our clinic review tool, if applicable. No additional management support is needed unless otherwise documented below in the visit note. 

## 2015-12-24 NOTE — Assessment & Plan Note (Signed)
Resolved, his mental status has returned to baseline.

## 2015-12-24 NOTE — Progress Notes (Signed)
Subjective:    Patient ID: [name], male    DOB: [birth year], 70 y.o.   MRN: [id number]  HPI  [name] is a 70 yr old male who presents today for hospital follow up. Discharge summary is reviewed.  The patient was admitted 9/26-9/28 presenting with generalized weakness and fatigue.  He was somnolent on admission and was noted to be hypoxic with oxygen down to 83%.  It was recommended that he minimize opioids and hypnotics.  His oxygen improved and he was discharged home with oxygen 2 L at bedtime which was not new for him. He was treated for acute bronchitis secondary to parainfluenza 3 virus.   Metformin was held during his admission. He has since restarted since he returned home.   Low back pain- his appointment with neurosurgery was rescheduled for 12/20/15.  He reports that he cancelled neurosurgery appointment.  Declines further work up at this time.     Review of Systems     Past Medical History:  Diagnosis Date  . CAD (coronary artery disease)    s/p CABG, s/p DES to LCX  January 2011  . Chronic pain    left sided-Kristeins  . CVA (cerebral infarction)    left hemiparesis  . Diastolic CHF ([place])   . Dysrhythmia    hx of atrial fibrilation with cardioversion  . GERD (gastroesophageal reflux disease)   . Gout   . Hyperlipidemia   . Hypertension   . Hypothyroidism   . OSA on CPAP   . Sleep apnea   . Stroke ([place])   . Tubular adenoma of colon 05/2010     Social History   Social History  . Marital status: Married    Spouse name: [name]  . Number of children: N/A  . Years of education: N/A   Occupational History  . retired Hydrographic surveyor   Social History Main Topics  . Smoking status: Former Research scientist (life sciences)  . Smokeless tobacco: Never Used  . Alcohol use No     Comment: 1 beer a month  . Drug use: No  . Sexual activity: No   Other Topics Concern  . Not on file   Social History Narrative   Pt lives with wife. Does have stairs, but patient  doesn't use them. Pt has completed technical school    Past Surgical History:  Procedure Laterality Date  . CARDIAC CATHETERIZATION N/A 10/25/2015   Procedure: Left Heart Cath and Cors/Grafts Angiography;  Surgeon: Troy Sine, MD;  Location: [place] [facility];  Service: Cardiovascular;  Laterality: N/A;  . CARDIOVERSION  06/20/2011   Procedure: CARDIOVERSION;  Surgeon: Larey Dresser, MD;  Location: [facility] ENDOSCOPY;  Service: Cardiovascular;  Laterality: N/A;  . CARDIOVERSION N/A 04/26/2015   Procedure: CARDIOVERSION;  Surgeon: Dorothy Spark, MD;  Location: [facility] ENDOSCOPY;  Service: Cardiovascular;  Laterality: N/A;  . CARDIOVERSION N/A 05/10/2015   Procedure: CARDIOVERSION;  Surgeon: Larey Dresser, MD;  Location: [place];  Service: Cardiovascular;  Laterality: N/A;  . CATARACT EXTRACTION  05/2010   left eye  . CATARACT EXTRACTION  04/2010   right eye  . CORONARY ARTERY BYPASS GRAFT     stent  . ESOPHAGOGASTRODUODENOSCOPY  03-25-2005  . NASAL SEPTUM SURGERY    . PERCUTANEOUS PLACEMENT INTRAVASCULAR STENT CERVICAL CAROTID ARTERY     03-2009; using a drug-eluting platform of the circumflex cornoray artery with a 3.0 x 18 [employer] Promus drug-eluting platform post dilated to 3.75 with  a noncompliant balloon.  . TEE WITHOUT CARDIOVERSION  06/20/2011   Procedure: TRANSESOPHAGEAL ECHOCARDIOGRAM (TEE);  Surgeon: Larey Dresser, MD;  Location: [facility] ENDOSCOPY;  Service: Cardiovascular;  Laterality: N/A;    Family History  Problem Relation Age of Onset  . Lung cancer Father     deceased  . Stroke Mother     deceased-MINISTROKES    No Known Allergies  Current Outpatient Prescriptions on File Prior to Visit  Medication Sig Dispense Refill  . allopurinol (ZYLOPRIM) 100 MG tablet TAKE 2 TABLETS BY MOUTH DAILY. D/C PREVIOUS SCRIPTS FOR THIS MEDICATION 180 tablet 1  . amiodarone (PACERONE) 100 MG tablet Take 1 tablet (100 mg total) by mouth daily. 90 tablet 3  . atorvastatin (LIPITOR)  40 MG tablet Take two tablets ( 80 mg total ) by mouth daily    . benazepril (LOTENSIN) 10 MG tablet Take 10 mg by mouth daily.    Marland Kitchen doxycycline (VIBRAMYCIN) 100 MG capsule Take 1 capsule (100 mg total) by mouth 2 (two) times daily. 14 capsule 0  . fluticasone (FLONASE) 50 MCG/ACT nasal spray Place 2 sprays into both nostrils daily. 9.9 g 1  . furosemide (LASIX) 40 MG tablet Take 1 tablet (40 mg total) by mouth every other day. 45 tablet 1  . isosorbide mononitrate (IMDUR) 30 MG 24 hr tablet Take 0.5 tablets (15 mg total) by mouth daily. 30 tablet 6  . levothyroxine (SYNTHROID, LEVOTHROID) 112 MCG tablet TAKE 1 TABLET (112 MCG TOTAL) BY MOUTH DAILY BEFORE BREAKFAST. 90 tablet 0  . metFORMIN (GLUCOPHAGE) 500 MG tablet Take by mouth daily with breakfast.    . Multiple Vitamins-Minerals (MULTIVITAMIN GUMMIES MENS PO) Take 1 tablet by mouth daily.    . nitroGLYCERIN (NITROSTAT) 0.4 MG SL tablet Place 1 tablet (0.4 mg total) under the tongue every 5 (five) minutes as needed for chest pain. Up to 3 doses 10 tablet 2  . pantoprazole (PROTONIX) 40 MG tablet Take 1 tablet (40 mg total) by mouth daily. 90 tablet 1  . traMADol (ULTRAM) 50 MG tablet Take 1 tablet (50 mg total) by mouth 2 (two) times daily. 180 tablet 1  . traMADol (ULTRAM-ER) 200 MG 24 hr tablet TAKE 1 TABLET EVERY DAY WITH AM DOSE OF TRAMADOL 50 MG 90 tablet 1  . vitamin B-12 1000 MCG tablet Take 1 tablet (1,000 mcg total) by mouth daily. 30 tablet 0  . warfarin (COUMADIN) 2.5 MG tablet Take warfarin 2.5mg  tonight, Saturday and Sunday and recheck INR in clinic Monday (Patient taking differently: Take 2.5 mg by mouth daily. ) 30 tablet 0   No current facility-administered medications on file prior to visit.     BP 127/71 (BP Location: Right Arm, Cuff Size: Large)   Pulse 66   Temp 98.2 F (36.8 C) (Oral)   Resp 16   Ht 6\' 3"  (1.905 m)   Wt 249 lb 9.6 oz (113.2 kg)   SpO2 99% Comment: room air  BMI 31.20 kg/m    Objective:    Physical Exam  Constitutional: He is oriented to person, place, and time. He appears well-developed and well-nourished. No distress.  HENT:  Head: Normocephalic and atraumatic.  Cardiovascular: Normal rate and regular rhythm.   No murmur heard. Pulmonary/Chest: Effort normal. No respiratory distress. He has no wheezes. He has rales in the right lower field.  Musculoskeletal: He exhibits no edema.  Neurological: He is alert and oriented to person, place, and time.  Skin: Skin is warm and dry.  Psychiatric: He has a normal mood and affect. His behavior is normal. Thought content normal.          Assessment & Plan:  PT/INR is elevated today- I have advised pt to hold coumadin tonight and will defer further orders to the coumadin clinic.  I have forwarded his INR to the coumadin clinic.

## 2015-12-24 NOTE — Telephone Encounter (Signed)
See Anticoag encounter

## 2016-01-07 ENCOUNTER — Ambulatory Visit (INDEPENDENT_AMBULATORY_CARE_PROVIDER_SITE_OTHER): Payer: Medicare Other | Admitting: *Deleted

## 2016-01-07 ENCOUNTER — Telehealth: Payer: Self-pay | Admitting: *Deleted

## 2016-01-07 ENCOUNTER — Ambulatory Visit: Payer: Medicare Other | Admitting: Physical Medicine & Rehabilitation

## 2016-01-07 DIAGNOSIS — I4891 Unspecified atrial fibrillation: Secondary | ICD-10-CM

## 2016-01-07 DIAGNOSIS — Z7901 Long term (current) use of anticoagulants: Secondary | ICD-10-CM | POA: Diagnosis not present

## 2016-01-07 LAB — POCT INR: INR: 2.9

## 2016-01-07 MED ORDER — BENAZEPRIL HCL 10 MG PO TABS: 10.0000 mg | ORAL_TABLET | Freq: Every day | ORAL | 5 refills | Status: DC

## 2016-01-07 NOTE — Telephone Encounter (Signed)
Spoke with pt. He confirmed that cardiology increased benazepril previously but it caused some "sloppy side effects" and was changed back to once daily dosing about  1 1/2 months ago.  Please advise if ok to send refills for once daily dosing or should cardiology refill medication?

## 2016-01-07 NOTE — Telephone Encounter (Signed)
Pt returned call. He says that he takes the Benazepril 1 Tablet once a day.

## 2016-01-07 NOTE — Telephone Encounter (Signed)
Notified pt. 

## 2016-01-07 NOTE — Telephone Encounter (Signed)
Received fax from [facility] requesting 90 day supply of benazepril 10mg , 1 tablet by mouth daily. Medication history indicates Rx was changed 06/22/15 by Dr Kathlen Mody and rx sent to pharmacy for on that date for 10mg , take 1 tablet twice a day due to "borderline increased high blood pressure". Attempted to reach pt and verify how he is taking medication and left message on cell# for pt to return my call.

## 2016-01-07 NOTE — Telephone Encounter (Signed)
BP stable on once daily dosing. Refill sent for once daily dosing.

## 2016-01-08 ENCOUNTER — Encounter: Payer: Self-pay | Admitting: Physical Medicine & Rehabilitation

## 2016-01-08 ENCOUNTER — Encounter: Payer: Medicare Other | Attending: Physical Medicine & Rehabilitation

## 2016-01-08 ENCOUNTER — Ambulatory Visit (HOSPITAL_BASED_OUTPATIENT_CLINIC_OR_DEPARTMENT_OTHER): Payer: Medicare Other | Admitting: Physical Medicine & Rehabilitation

## 2016-01-08 VITALS — BP 129/73 | HR 76 | Resp 14

## 2016-01-08 DIAGNOSIS — I69354 Hemiplegia and hemiparesis following cerebral infarction affecting left non-dominant side: Secondary | ICD-10-CM | POA: Insufficient documentation

## 2016-01-08 DIAGNOSIS — G8110 Spastic hemiplegia affecting unspecified side: Secondary | ICD-10-CM

## 2016-01-08 DIAGNOSIS — G8114 Spastic hemiplegia affecting left nondominant side: Secondary | ICD-10-CM | POA: Diagnosis not present

## 2016-01-08 MED ORDER — TRAMADOL HCL 50 MG PO TABS: 50.0000 mg | ORAL_TABLET | Freq: Two times a day (BID) | ORAL | 1 refills | Status: DC

## 2016-01-08 MED ORDER — TRAMADOL HCL ER 200 MG PO TB24: ORAL_TABLET | ORAL | 1 refills | Status: DC

## 2016-01-08 MED ORDER — GABAPENTIN 100 MG PO CAPS: 100.0000 mg | ORAL_CAPSULE | Freq: Three times a day (TID) | ORAL | 0 refills | Status: DC

## 2016-01-08 NOTE — Progress Notes (Signed)
Botox Injection for spasticity using needle EMG guidance  Dilution: 50 Units/ml Indication: Severe spasticity which interferes with ADL,mobility and/or  hygiene and is unresponsive to medication management and other conservative care Informed consent was obtained after describing risks and benefits of the procedure with the patient. This includes bleeding, bruising, infection, excessive weakness, or medication side effects. A REMS form is on file and signed. Needle: 27g 1" needle electrode Number of units per muscle Biceps75 FCR50   Gastrosoleus0  Hamstrings 200, long head All injections were done after obtaining appropriate EMG activity and after negative drawback for blood. The patient tolerated the procedure well. Post procedure instructions were given. A followup appointment was made.

## 2016-01-08 NOTE — Patient Instructions (Signed)

## 2016-01-16 ENCOUNTER — Ambulatory Visit: Payer: Medicare Other | Admitting: Neurology

## 2016-01-16 DIAGNOSIS — Z029 Encounter for administrative examinations, unspecified: Secondary | ICD-10-CM

## 2016-01-28 ENCOUNTER — Ambulatory Visit (INDEPENDENT_AMBULATORY_CARE_PROVIDER_SITE_OTHER): Payer: Medicare Other | Admitting: *Deleted

## 2016-01-28 DIAGNOSIS — Z7901 Long term (current) use of anticoagulants: Secondary | ICD-10-CM

## 2016-01-28 DIAGNOSIS — I4891 Unspecified atrial fibrillation: Secondary | ICD-10-CM | POA: Diagnosis not present

## 2016-01-28 DIAGNOSIS — I208 Other forms of angina pectoris: Secondary | ICD-10-CM

## 2016-01-28 LAB — POCT INR: INR: 2

## 2016-02-18 ENCOUNTER — Telehealth: Payer: Self-pay | Admitting: *Deleted

## 2016-02-18 NOTE — Telephone Encounter (Signed)
Pt declines at this time

## 2016-02-19 ENCOUNTER — Ambulatory Visit: Payer: Medicare Other | Admitting: Physical Medicine & Rehabilitation

## 2016-02-19 ENCOUNTER — Telehealth: Payer: Self-pay | Admitting: Physical Medicine & Rehabilitation

## 2016-02-19 NOTE — Telephone Encounter (Signed)
Called patient to let him know he missed his appointment and he said he didn't think he needed to come because he wasn't going to take the Neurontin.  He said he has enough problems and doesn't want to take it.  He will call us back later to reschedule an appointment.

## 2016-02-19 NOTE — Telephone Encounter (Signed)
Ok

## 2016-02-20 ENCOUNTER — Telehealth: Payer: Self-pay | Admitting: Family

## 2016-02-20 ENCOUNTER — Encounter: Payer: Self-pay | Admitting: Family

## 2016-02-20 ENCOUNTER — Ambulatory Visit (HOSPITAL_BASED_OUTPATIENT_CLINIC_OR_DEPARTMENT_OTHER)
Admission: RE | Admit: 2016-02-20 | Discharge: 2016-02-20 | Disposition: A | Discharge status: Home / Self Care | Payer: Medicare Other | Source: Ambulatory Visit | Attending: Family | Admitting: Family

## 2016-02-20 ENCOUNTER — Ambulatory Visit (INDEPENDENT_AMBULATORY_CARE_PROVIDER_SITE_OTHER): Payer: Medicare Other | Admitting: Family

## 2016-02-20 VITALS — BP 128/69 | HR 72 | Temp 98.0°F | Resp 16 | Ht 75.0 in | Wt 257.2 lb

## 2016-02-20 DIAGNOSIS — R531 Weakness: Secondary | ICD-10-CM

## 2016-02-20 DIAGNOSIS — I208 Other forms of angina pectoris: Secondary | ICD-10-CM

## 2016-02-20 DIAGNOSIS — Z8673 Personal history of transient ischemic attack (TIA), and cerebral infarction without residual deficits: Secondary | ICD-10-CM

## 2016-02-20 DIAGNOSIS — R269 Unspecified abnormalities of gait and mobility: Secondary | ICD-10-CM | POA: Diagnosis not present

## 2016-02-20 NOTE — Progress Notes (Signed)
Subjective:    Patient ID: [name], male    DOB: 12-16-1945, 70 y.o.   MRN: [id number]  HPI  [name] is a 70 yr old male who presents today with chief complaint of balance problem. Reports that his worsening balance has been over the last few months. He has a history of remote CVA with L sided hemiparesis.    Review of Systems    see HPI  Past Medical History:  Diagnosis Date  . CAD (coronary artery disease)    s/p CABG, s/p DES to LCX  January 2011  . Chronic pain    left sided-Kristeins  . CVA (cerebral infarction)    left hemiparesis  . Diastolic CHF ([place])   . Dysrhythmia    hx of atrial fibrilation with cardioversion  . GERD (gastroesophageal reflux disease)   . Gout   . Hyperlipidemia   . Hypertension   . Hypothyroidism   . OSA on CPAP   . Sleep apnea   . Stroke ([place])   . Tubular adenoma of colon 05/2010     Social History   Social History  . Marital status: Married    Spouse name: [name]  . Number of children: N/A  . Years of education: N/A   Occupational History  . retired Hydrographic surveyor   Social History Main Topics  . Smoking status: Former Research scientist (life sciences)  . Smokeless tobacco: Never Used  . Alcohol use No     Comment: 1 beer a month  . Drug use: No  . Sexual activity: No   Other Topics Concern  . Not on file   Social History Narrative   Pt lives with wife. Does have stairs, but patient doesn't use them. Pt has completed technical school    Past Surgical History:  Procedure Laterality Date  . CARDIAC CATHETERIZATION N/A 10/25/2015   Procedure: Left Heart Cath and Cors/Grafts Angiography;  Surgeon: Troy Sine, MD;  Location: [place] [facility];  Service: Cardiovascular;  Laterality: N/A;  . CARDIOVERSION  06/20/2011   Procedure: CARDIOVERSION;  Surgeon: Larey Dresser, MD;  Location: [facility] ENDOSCOPY;  Service: Cardiovascular;  Laterality: N/A;  . CARDIOVERSION N/A 04/26/2015   Procedure: CARDIOVERSION;  Surgeon: Dorothy Spark, MD;  Location: [facility] ENDOSCOPY;  Service: Cardiovascular;  Laterality: N/A;  . CARDIOVERSION N/A 05/10/2015   Procedure: CARDIOVERSION;  Surgeon: Larey Dresser, MD;  Location: [place];  Service: Cardiovascular;  Laterality: N/A;  . CATARACT EXTRACTION  05/2010   left eye  . CATARACT EXTRACTION  04/2010   right eye  . CORONARY ARTERY BYPASS GRAFT     stent  . ESOPHAGOGASTRODUODENOSCOPY  03-25-2005  . NASAL SEPTUM SURGERY    . PERCUTANEOUS PLACEMENT INTRAVASCULAR STENT CERVICAL CAROTID ARTERY     03-2009; using a drug-eluting platform of the circumflex cornoray artery with a 3.0 x 18 [employer] Promus drug-eluting platform post dilated to 3.75 with a noncompliant balloon.  . TEE WITHOUT CARDIOVERSION  06/20/2011   Procedure: TRANSESOPHAGEAL ECHOCARDIOGRAM (TEE);  Surgeon: Larey Dresser, MD;  Location: [facility] ENDOSCOPY;  Service: Cardiovascular;  Laterality: N/A;    Family History  Problem Relation Age of Onset  . Lung cancer Father     deceased  . Stroke Mother     deceased-MINISTROKES    No Known Allergies  Current Outpatient Prescriptions on File Prior to Visit  Medication Sig Dispense Refill  . allopurinol (ZYLOPRIM) 100 MG tablet TAKE 2 TABLETS BY MOUTH  DAILY. D/C PREVIOUS SCRIPTS FOR THIS MEDICATION 180 tablet 1  . amiodarone (PACERONE) 100 MG tablet Take 1 tablet (100 mg total) by mouth daily. 90 tablet 3  . atorvastatin (LIPITOR) 40 MG tablet Take two tablets ( 80 mg total ) by mouth daily    . benazepril (LOTENSIN) 10 MG tablet Take 1 tablet (10 mg total) by mouth daily. 30 tablet 5  . fluticasone (FLONASE) 50 MCG/ACT nasal spray Place 2 sprays into both nostrils daily. 9.9 g 1  . furosemide (LASIX) 40 MG tablet Take 1 tablet (40 mg total) by mouth every other day. 45 tablet 1  . gabapentin (NEURONTIN) 100 MG capsule Take 1 capsule (100 mg total) by mouth 3 (three) times daily. 90 capsule 0  . isosorbide mononitrate (IMDUR) 30 MG 24 hr tablet Take 0.5 tablets (15 mg  total) by mouth daily. 30 tablet 6  . levothyroxine (SYNTHROID, LEVOTHROID) 112 MCG tablet TAKE 1 TABLET (112 MCG TOTAL) BY MOUTH DAILY BEFORE BREAKFAST. 90 tablet 0  . metFORMIN (GLUCOPHAGE) 500 MG tablet Take by mouth daily with breakfast.    . Multiple Vitamins-Minerals (MULTIVITAMIN GUMMIES MENS PO) Take 1 tablet by mouth daily.    . nitroGLYCERIN (NITROSTAT) 0.4 MG SL tablet Place 1 tablet (0.4 mg total) under the tongue every 5 (five) minutes as needed for chest pain. Up to 3 doses 10 tablet 2  . pantoprazole (PROTONIX) 40 MG tablet Take 1 tablet (40 mg total) by mouth daily. 90 tablet 1  . traMADol (ULTRAM) 50 MG tablet Take 1 tablet (50 mg total) by mouth 2 (two) times daily. 180 tablet 1  . traMADol (ULTRAM-ER) 200 MG 24 hr tablet TAKE 1 TABLET EVERY DAY WITH AM DOSE OF TRAMADOL 50 MG 90 tablet 1  . vitamin B-12 1000 MCG tablet Take 1 tablet (1,000 mcg total) by mouth daily. 30 tablet 0  . warfarin (COUMADIN) 2.5 MG tablet Take warfarin 2.5mg  tonight, Saturday and Sunday and recheck INR in clinic Monday (Patient taking differently: Take 2.5 mg by mouth daily. ) 30 tablet 0   No current facility-administered medications on file prior to visit.     BP 128/69 (BP Location: Right Arm, Cuff Size: Large)   Pulse 72   Temp 98 F (36.7 C) (Oral)   Resp 16   Ht 6\' 3"  (1.905 m)   Wt 257 lb 3.2 oz (116.7 kg)   SpO2 98% Comment: room air  BMI 32.15 kg/m    Objective:   Physical Exam  Constitutional: He is oriented to person, place, and time. He appears well-developed and well-nourished. No distress.  HENT:  Head: Normocephalic and atraumatic.  Eyes: EOM are normal.  Cardiovascular: Normal rate and regular rhythm.   No murmur heard. Pulmonary/Chest: Effort normal and breath sounds normal. No respiratory distress. He has no wheezes. He has no rales.  Musculoskeletal: He exhibits no edema.  Neurological: He is alert and oriented to person, place, and time.  LUE/LLE strength is  4-5/5/ RLE/RUE strength is 5/5  Shuffling, unsteady gait is noted  Skin: Skin is warm and dry.  Psychiatric: He has a normal mood and affect. His behavior is normal. Thought content normal.          Assessment & Plan:  Gait disorder/Balance problem- CT head is obtained to further evaluate.  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 No acute intracranial abnormality.  Will refer to neurology for further evaluation. See phone note.

## 2016-02-20 NOTE — Telephone Encounter (Signed)
CT does not show obvious new stroke. I would like to refer him to neurology for further evaluation.

## 2016-02-20 NOTE — Progress Notes (Signed)
Pre visit review using our clinic review tool, if applicable. No additional management support is needed unless otherwise documented below in the visit note. 

## 2016-02-20 NOTE — Patient Instructions (Signed)
We will let you know how your CT scan looks.

## 2016-02-20 NOTE — Telephone Encounter (Signed)
Notified pt and he voices understanding and is agreeable to referral.

## 2016-02-25 ENCOUNTER — Ambulatory Visit (INDEPENDENT_AMBULATORY_CARE_PROVIDER_SITE_OTHER): Payer: Medicare Other | Admitting: *Deleted

## 2016-02-25 DIAGNOSIS — I4891 Unspecified atrial fibrillation: Secondary | ICD-10-CM

## 2016-02-25 DIAGNOSIS — Z7901 Long term (current) use of anticoagulants: Secondary | ICD-10-CM

## 2016-02-25 DIAGNOSIS — I208 Other forms of angina pectoris: Secondary | ICD-10-CM

## 2016-02-25 LAB — POCT INR: INR: 2

## 2016-03-01 ENCOUNTER — Other Ambulatory Visit: Payer: Self-pay | Admitting: Family

## 2016-03-13 ENCOUNTER — Other Ambulatory Visit: Payer: Self-pay

## 2016-03-13 DIAGNOSIS — L603 Nail dystrophy: Secondary | ICD-10-CM | POA: Diagnosis not present

## 2016-03-13 DIAGNOSIS — I739 Peripheral vascular disease, unspecified: Secondary | ICD-10-CM | POA: Diagnosis not present

## 2016-03-13 MED ORDER — LEVOTHYROXINE SODIUM 112 MCG PO TABS: 112.0000 ug | ORAL_TABLET | Freq: Every day | ORAL | 0 refills | Status: DC

## 2016-03-25 ENCOUNTER — Encounter: Payer: Self-pay | Admitting: Diagnostic Neuroimaging

## 2016-03-25 ENCOUNTER — Ambulatory Visit: Payer: Medicare Other | Admitting: Family

## 2016-03-25 ENCOUNTER — Ambulatory Visit (INDEPENDENT_AMBULATORY_CARE_PROVIDER_SITE_OTHER): Payer: Medicare Other | Admitting: Diagnostic Neuroimaging

## 2016-03-25 VITALS — BP 141/84 | HR 81 | Ht 75.0 in | Wt 254.8 lb

## 2016-03-25 DIAGNOSIS — M48062 Spinal stenosis, lumbar region with neurogenic claudication: Secondary | ICD-10-CM | POA: Diagnosis not present

## 2016-03-25 DIAGNOSIS — I10 Essential (primary) hypertension: Secondary | ICD-10-CM | POA: Diagnosis not present

## 2016-03-25 DIAGNOSIS — G8110 Spastic hemiplegia affecting unspecified side: Secondary | ICD-10-CM

## 2016-03-25 DIAGNOSIS — E785 Hyperlipidemia, unspecified: Secondary | ICD-10-CM | POA: Diagnosis not present

## 2016-03-25 DIAGNOSIS — E1142 Type 2 diabetes mellitus with diabetic polyneuropathy: Secondary | ICD-10-CM | POA: Diagnosis not present

## 2016-03-25 NOTE — Patient Instructions (Signed)
-   I recommend conservative approach (physical therapy exercises, rollator walker, safety and fall precautions)

## 2016-03-25 NOTE — Progress Notes (Signed)
[facility]  PATIENT: [name] DOB: [birth year]  REFERRING CLINICIAN: Inda Castle HISTORY FROM: patient  REASON FOR VISIT: new consult    HISTORICAL  CHIEF COMPLAINT:  Chief Complaint  Patient presents with  . Cerebrovascular Accident    rm 7, New Pt "increased issue w/gait, balance; CVA 1996"    HISTORY OF PRESENT ILLNESS:   71 year old male with history of stroke in 1996, with resultant left-sided numbness and weakness, here for evaluation of ongoing balance and gait difficulty. Patient has had gradually worsening gait and balance difficulty for several years. He's been using a walker for the past 2 months. He feels worsening balance, numbness and weakness in his lower extremity. He denies any low back pain. Patient feels as though he could fall down. Patient feels worse symptoms if he stands and walks for a long time. Some symptoms relieved if he sits down.  Patient was admitted to the hospital in September 2017 for acute toxic metabolic encephalopathy, acute on chronic hypoxic respiratory failure, acute bronchitis, low back pain.   REVIEW OF SYSTEMS: Full 14 system review of systems performed and negative with exception of: Only as per history of present illness.  ALLERGIES: No Known Allergies  HOME MEDICATIONS: Outpatient Medications Prior to Visit  Medication Sig Dispense Refill  . allopurinol (ZYLOPRIM) 100 MG tablet TAKE 2 TABLETS BY MOUTH DAILY. D/C PREVIOUS SCRIPTS FOR THIS MEDICATION 180 tablet 1  . amiodarone (PACERONE) 100 MG tablet Take 1 tablet (100 mg total) by mouth daily. 90 tablet 3  . atorvastatin (LIPITOR) 40 MG tablet Take two tablets ( 80 mg total ) by mouth daily    . benazepril (LOTENSIN) 10 MG tablet Take 1 tablet (10 mg total) by mouth daily. 30 tablet 5  . fluticasone (FLONASE) 50 MCG/ACT nasal spray Place 2 sprays into both nostrils daily. 9.9 g 1  . furosemide (LASIX) 40 MG tablet TAKE 1 TABLET (40 MG TOTAL) BY MOUTH  EVERY OTHER DAY. 45 tablet 1  . isosorbide mononitrate (IMDUR) 30 MG 24 hr tablet Take 0.5 tablets (15 mg total) by mouth daily. 30 tablet 6  . levothyroxine (SYNTHROID, LEVOTHROID) 112 MCG tablet Take 1 tablet (112 mcg total) by mouth daily before breakfast. 90 tablet 0  . metFORMIN (GLUCOPHAGE) 500 MG tablet Take by mouth daily with breakfast.    . Multiple Vitamins-Minerals (MULTIVITAMIN GUMMIES MENS PO) Take 1 tablet by mouth daily.    . nitroGLYCERIN (NITROSTAT) 0.4 MG SL tablet Place 1 tablet (0.4 mg total) under the tongue every 5 (five) minutes as needed for chest pain. Up to 3 doses 10 tablet 2  . pantoprazole (PROTONIX) 40 MG tablet Take 1 tablet (40 mg total) by mouth daily. 90 tablet 1  . traMADol (ULTRAM) 50 MG tablet Take 1 tablet (50 mg total) by mouth 2 (two) times daily. 180 tablet 1  . traMADol (ULTRAM-ER) 200 MG 24 hr tablet TAKE 1 TABLET EVERY DAY WITH AM DOSE OF TRAMADOL 50 MG 90 tablet 1  . vitamin B-12 1000 MCG tablet Take 1 tablet (1,000 mcg total) by mouth daily. 30 tablet 0  . warfarin (COUMADIN) 2.5 MG tablet Take warfarin 2.5mg  tonight, Saturday and Sunday and recheck INR in clinic Monday (Patient taking differently: Take 2.5 mg by mouth daily. ) 30 tablet 0  . gabapentin (NEURONTIN) 100 MG capsule Take 1 capsule (100 mg total) by mouth 3 (three) times daily. (Patient not taking: Reported on 03/25/2016) 90 capsule 0   No facility-administered medications  prior to visit.     PAST MEDICAL HISTORY: Past Medical History:  Diagnosis Date  . CAD (coronary artery disease)    s/p CABG, s/p DES to LCX  January 2011  . Chronic pain    left sided-Kristeins  . CVA (cerebral infarction)    left hemiparesis  . Diastolic CHF ([place])   . Dysrhythmia    hx of atrial fibrilation with cardioversion  . GERD (gastroesophageal reflux disease)   . Gout   . Hyperlipidemia   . Hypertension   . Hypothyroidism   . OSA on CPAP   . Sleep apnea   . Stroke ([place]) 1996  . Tubular adenoma  of colon 05/2010    PAST SURGICAL HISTORY: Past Surgical History:  Procedure Laterality Date  . CARDIAC CATHETERIZATION N/A 10/25/2015   Procedure: Left Heart Cath and Cors/Grafts Angiography;  Surgeon: Troy Sine, MD;  Location: [place] [facility];  Service: Cardiovascular;  Laterality: N/A;  . CARDIOVERSION  06/20/2011   Procedure: CARDIOVERSION;  Surgeon: Larey Dresser, MD;  Location: [facility] ENDOSCOPY;  Service: Cardiovascular;  Laterality: N/A;  . CARDIOVERSION N/A 04/26/2015   Procedure: CARDIOVERSION;  Surgeon: Dorothy Spark, MD;  Location: [facility] ENDOSCOPY;  Service: Cardiovascular;  Laterality: N/A;  . CARDIOVERSION N/A 05/10/2015   Procedure: CARDIOVERSION;  Surgeon: Larey Dresser, MD;  Location: [place];  Service: Cardiovascular;  Laterality: N/A;  . CATARACT EXTRACTION  05/2010   left eye  . CATARACT EXTRACTION  04/2010   right eye  . CORONARY ARTERY BYPASS GRAFT     stent  . ESOPHAGOGASTRODUODENOSCOPY  03-25-2005  . NASAL SEPTUM SURGERY    . PERCUTANEOUS PLACEMENT INTRAVASCULAR STENT CERVICAL CAROTID ARTERY     03-2009; using a drug-eluting platform of the circumflex cornoray artery with a 3.0 x 18 [employer] Promus drug-eluting platform post dilated to 3.75 with a noncompliant balloon.  . TEE WITHOUT CARDIOVERSION  06/20/2011   Procedure: TRANSESOPHAGEAL ECHOCARDIOGRAM (TEE);  Surgeon: Larey Dresser, MD;  Location: [facility] ENDOSCOPY;  Service: Cardiovascular;  Laterality: N/A;    FAMILY HISTORY: Family History  Problem Relation Age of Onset  . Lung cancer Father     deceased  . Stroke Mother     deceased-MINISTROKES    SOCIAL HISTORY:  Social History   Social History  . Marital status: Married    Spouse name: [name]  . Number of children: N/A  . Years of education: 29   Occupational History  . retired Hydrographic surveyor   Social History Main Topics  . Smoking status: Never Smoker  . Smokeless tobacco: Never Used  . Alcohol use No     Comment:  1 beer a month  . Drug use: No  . Sexual activity: No   Other Topics Concern  . Not on file   Social History Narrative   Pt lives with wife. Does have stairs, but patient doesn't use them. Pt has completed technical school     PHYSICAL EXAM  GENERAL EXAM/CONSTITUTIONAL: Vitals:  Vitals:   03/25/16 0912  BP: (!) 141/84  Pulse: 81  Weight: 254 lb 12.8 oz (115.6 kg)  Height: 6\' 3"  (1.905 m)     Body mass index is 31.85 kg/m.  Visual Acuity Screening   Right eye Left eye Both eyes  Without correction:     With correction: 20/30 20/30      Patient is in no distress; well developed, nourished and groomed; neck is supple  CARDIOVASCULAR:  Examination of carotid arteries is normal; no carotid bruits  Regular rate and rhythm, no murmurs  Examination of peripheral vascular system by observation and palpation is normal  EYES:  Ophthalmoscopic exam of optic discs and posterior segments is normal; no papilledema or hemorrhages  MUSCULOSKELETAL:  Gait, strength, tone, movements noted in Neurologic exam below  NEUROLOGIC: MENTAL STATUS:  No flowsheet data found.  awake, alert, oriented to person, place and time  DECR recent and remote memory   normal attention and concentration  language fluent, comprehension intact, naming intact,   fund of knowledge appropriate  CRANIAL NERVE:   2nd - no papilledema on fundoscopic exam  2nd, 3rd, 4th, 6th - pupils equal and reactive to light, visual fields full to confrontation, extraocular muscles intact, no nystagmus  5th - facial sensation --> DECR IN LEFT FACE  7th - facial strength symmetric  8th - hearing intact  9th - palate elevates symmetrically, uvula midline  11th - shoulder shrug symmetric  12th - tongue protrusion midline  MOTOR:   normal bulk; INCR TONE IN LUE AND LLE  RUE AND RLE 5/5  LUE 4 PROX AND 3 DISTAL  LLE 4   SENSORY:   normal and symmetric to light touch, pinprick, temperature,  vibration IN RIGHT SIDE  DECR IN LEFT FACE, ARM, LEG TO ALL MODALITIES  DECR VIB IN RIGHT FOOT (<5 SEC) AND LEFT FOOT (BARELY NOTICES VIB)  COORDINATION:   finger-nose-finger, fine finger movements SLOW IN LEFT SIDE  REFLEXES:   deep tendon 1+ present and symmetric  ABSENT AT ANKLES  GAIT/STATION:   LEFT EHMIPARETIC GAIT; USES WALKER  SHORT STEPS  UNSTEADY GAIT    DIAGNOSTIC DATA (LABS, IMAGING, TESTING) - I reviewed patient records, labs, notes, testing and imaging myself where available.  Lab Results  Component Value Date   WBC 6.6 12/24/2015   HGB 13.4 12/24/2015   HCT 40.5 12/24/2015   MCV 89.8 12/24/2015   PLT 194.0 12/24/2015      Component Value Date/Time   NA 140 12/24/2015 1136   K 4.1 12/24/2015 1136   CL 102 12/24/2015 1136   CO2 32 12/24/2015 1136   GLUCOSE 124 (H) 12/24/2015 1136   BUN 17 12/24/2015 1136   CREATININE 1.10 12/24/2015 1136   CREATININE 1.27 (H) 11/05/2015 1532   CALCIUM 9.6 12/24/2015 1136   PROT 7.1 12/24/2015 1136   ALBUMIN 3.7 12/24/2015 1136   AST 26 12/24/2015 1136   ALT 39 12/24/2015 1136   ALKPHOS 59 12/24/2015 1136   BILITOT 0.6 12/24/2015 1136   GFRNONAA >60 12/13/2015 0438   GFRNONAA 73 06/25/2012 0826   GFRAA >60 12/13/2015 0438   GFRAA 84 06/25/2012 0826   Lab Results  Component Value Date   CHOL 142 10/23/2015   HDL 42 10/23/2015   LDLCALC 75 10/23/2015   TRIG 127 10/23/2015   CHOLHDL 3.4 10/23/2015   Lab Results  Component Value Date   HGBA1C 5.8 (H) 10/22/2015   Lab Results  Component Value Date   [id number] 12/12/2015   Lab Results  Component Value Date   TSH 0.516 12/12/2015    11/25/14 MRI brain [I reviewed images myself and agree with interpretation. -VRP]  1. The right basal ganglia hemorrhagic infarcts have expanded since the prior exam without evidence for acute infarct. 2. Atrophy and diffuse white matter disease is otherwise stable. 3. The study is moderately degraded by patient  motion.  12/08/15 MRI lumbar spine [I reviewed images myself and agree  with interpretation. -VRP]  - Multilevel spondylosis. Central canal narrowing appears worst at L3-4 where there is moderate to moderately severe central canal stenosis with narrowing of both lateral recesses. No nerve root compression is identified. Multilevel facet arthropathy is also identified. Please see above for descriptions of individual levels. - Mild anterior and remote superior endplate compression fracture of L1.  02/20/16 CT head [I reviewed images myself and agree with interpretation. -VRP]  - Extensive chronic microvascular disease throughout the deep white matter. No acute intracranial abnormality.     ASSESSMENT AND PLAN  71 y.o. year old male here with gradually progressive gait and balance difficulty in last 2-3 years, especially in last 2-3 months. History of stroke in 1996 (with residual left hemiparesis and left hemi-sensory loss). History of lumbar spinal stenosis (moderate-severe).   Dx: gait difficulty due late effects of stroke + lumbar spinal stenosis with neurogenic claudication  1. Spastic hemiparesis affecting nondominant side (HCC)   2. Spinal stenosis, lumbar region, with neurogenic claudication   3. Type 2 diabetes mellitus with diabetic polyneuropathy, unspecified long term insulin use status ([place])   4. Essential hypertension   5. Hyperlipidemia, unspecified hyperlipidemia type      PLAN: - recommend conservative approach (PT, exercises, rollator walker, safety and fall precautions); patient will think about these and let me or PCP know if he wants to pursue these - patient not interested in surgical treatments for lumbar spinal stenosis at this time  Orders Placed This Encounter  Procedures  . For home use only DME 4 wheeled rolling walker with seat   Return if symptoms worsen or fail to improve, for return to PCP.    Penni Bombard, MD [id number], [id number] AM Certified in  Neurology, Neurophysiology and Neuroimaging  [facility] [address], [place], [place] [address] [phone]

## 2016-04-07 ENCOUNTER — Ambulatory Visit (INDEPENDENT_AMBULATORY_CARE_PROVIDER_SITE_OTHER): Payer: Medicare Other

## 2016-04-07 DIAGNOSIS — Z7901 Long term (current) use of anticoagulants: Secondary | ICD-10-CM

## 2016-04-07 DIAGNOSIS — I4891 Unspecified atrial fibrillation: Secondary | ICD-10-CM

## 2016-04-07 LAB — POCT INR: INR: 2.1

## 2016-04-11 ENCOUNTER — Telehealth: Payer: Self-pay | Admitting: Family

## 2016-04-11 MED ORDER — BENAZEPRIL HCL 10 MG PO TABS: 10.0000 mg | ORAL_TABLET | Freq: Every day | ORAL | 0 refills | Status: DC

## 2016-04-11 NOTE — Telephone Encounter (Signed)
Please call and schedule

## 2016-04-11 NOTE — Telephone Encounter (Signed)
Sent 90 day supply to pharmacy. Pt last seen for acute concern on 02/20/16 and has no future appts scheduled. When should pt follow up in the office?

## 2016-04-11 NOTE — Telephone Encounter (Signed)
LVM informing patient to schedule a 3 month follow up appt at his convenience.

## 2016-04-11 NOTE — Telephone Encounter (Signed)
Caller name: Relationship to patient: Self Can be reached: [phone]  Pharmacy:  [id number] - [place], [place]. [phone] (Phone) [phone] (Fax)     Reason for call: Request a 90 day supply of benazepril (LOTENSIN) 10 MG tablet

## 2016-04-11 NOTE — Telephone Encounter (Signed)
3 month follow up please

## 2016-04-20 NOTE — Progress Notes (Signed)
Cardiology Office Note:    Date:  04/21/2016   ID:  [name], DOB 1945/04/20, MRN [id number]  PCP:  [name]., NP  Cardiologist:  Dr. [name] >> Dr. Kristen Cardinal, PA-C Electrophysiologist:  n/a  Referring MD: Debbrah Alar, NP   Chief Complaint  Patient presents with  . Follow-up    CAD, atrial fibrillation    History of Present Illness:    [name] is a 71 y.o. male with a hx of CAD s/p CABG, PAF, prior CVA. Myoview in 2013 was neg for ischemia. He has residual gait difficulty and L hemiparesis 2/2 to prior CVA.     Admitted in 2/17 from AFib Clinic with AF with RVR. He had undergone DCCV earlier in 2/17 for AF with RVR. HR was in 150s. He was placed on Amiodarone gtt. He underwent DCCV with return of NSR and was DC home on Amiodarone.  His beta-blocker was ultimately DC'd due to bradycardia.    Admitted 8/17 with chest pain and dyspnea. Cardiac enzymes remained negative. Inpatient nuclear stress test was intermediate risk with small prior apical infarct versus thinning with mild apical ischemia and mild to moderate ischemia in the inferolateral wall, EF 49%. Cardiac catheterization demonstrated stable anatomy. Medical Rx continued.    Last seen by Dr. [name] in 10/17.  He returns for Cardiology follow up.  He is here alone.  He is doing fairly well.  He denies chest pain, significant dyspnea on exertion, orthopnea, PND, edema, syncope.  He denies any bleeding issues.  He does have a lot of issues with his balance.  His L leg also causes him a lot of discomfort.    Prior CV studies that were reviewed today include:    LHC 10/25/15 LAD ostial 90%, D1 occluded LCx ostial 95%, mid stent patent with 30% ISR RCA small, nondominant-never visualized LIMA-LAD patent, beyond graft insertion 95% apical LAD SVG-D1-Y graft with superior limb anastomosed to D1 and inferior limb anastomosed to OM2 patent SVG-PDA known to be  occluded Medical Rx  Myoview 10/23/15 Intermediate stress nuclear study with small prior apical infarct vs thinning with mild apical ischemia and mild to moderate ischemia in the inferolateral wall; EF 49 with mild global hypokinesis.  Echo 2/17 Mild LVH, EF 50-55%, no RWMA, normal diastolic function, mild LAE, mod TR  Carotid US 5/15 bilat ICA 1-39%  Myoview 8/13 Overall Impression: Normal stress nuclear study with a small, mild, fixed inferior defect consistent with inferior thinning but no ischemia. LV Ejection Fraction: 62%. LV Wall Motion: NL LV Function; NL Wall Motion  LHC 1/11 LM dist 40% LAD ostial 40%, mid 60-70%, 40% LCx occluded OM2, LCx stent patent with 40% ISR, mid AV groove 90% RCA prox 30-40%, mid 30% L-LAD patent S-Dx/OM2 patent S-PDA occluded EF 60% PCI 3 x 18 mm Promus DES to mid LCx  Past Medical History:  Diagnosis Date  . CAD (coronary artery disease)    s/p CABG, s/p DES to LCX  January 2011  . Chronic pain    left sided-Kristeins  . CVA (cerebral infarction)    left hemiparesis  . Diastolic CHF ([place])   . Dysrhythmia    hx of atrial fibrilation with cardioversion  . GERD (gastroesophageal reflux disease)   . Gout   . Hyperlipidemia   . Hypertension   . Hypothyroidism   . OSA on CPAP   . Sleep apnea   . Stroke ([place]) 1996  . Tubular adenoma of  colon 05/2010  1. GERD 2. Gout 3. Hypothyroidism 4. HTN 5. H/o CVA: some residual left-sided weakness.  6. Borderline DM2 7. CAD s/p CABG in 2004. LHC in 2011: SVG-RCA and SVG-OM totally occluded. LIMA-LAD patent. Patient had DES to CFX, flow down native RCA was ok. Lexiscan myoview (8/13): EF 62%, small mild fixed inferior defect with no ischemia. Echo (9/14) with EF 55-60%, grade II diastolic dysfunction, PA systolic pressure 35 mmHg.  8. OSA: Intolerant of CPAP, wears oxygen at night.  9. Paroxysmal atrial fibrillation. TEE (4/13) with EF 55-60%, no significant valvular  abnormalities.  10. Carotid dopplers (5/15) with minimal stenosis  Past Surgical History:  Procedure Laterality Date  . CARDIAC CATHETERIZATION N/A 10/25/2015   Procedure: Left Heart Cath and Cors/Grafts Angiography;  Surgeon: Troy Sine, MD;  Location: [place] [facility];  Service: Cardiovascular;  Laterality: N/A;  . CARDIOVERSION  06/20/2011   Procedure: CARDIOVERSION;  Surgeon: Larey Dresser, MD;  Location: [facility] ENDOSCOPY;  Service: Cardiovascular;  Laterality: N/A;  . CARDIOVERSION N/A 04/26/2015   Procedure: CARDIOVERSION;  Surgeon: Dorothy Spark, MD;  Location: [facility] ENDOSCOPY;  Service: Cardiovascular;  Laterality: N/A;  . CARDIOVERSION N/A 05/10/2015   Procedure: CARDIOVERSION;  Surgeon: Larey Dresser, MD;  Location: [place];  Service: Cardiovascular;  Laterality: N/A;  . CATARACT EXTRACTION  05/2010   left eye  . CATARACT EXTRACTION  04/2010   right eye  . CORONARY ARTERY BYPASS GRAFT     stent  . ESOPHAGOGASTRODUODENOSCOPY  03-25-2005  . NASAL SEPTUM SURGERY    . PERCUTANEOUS PLACEMENT INTRAVASCULAR STENT CERVICAL CAROTID ARTERY     03-2009; using a drug-eluting platform of the circumflex cornoray artery with a 3.0 x 18 [employer] Promus drug-eluting platform post dilated to 3.75 with a noncompliant balloon.  . TEE WITHOUT CARDIOVERSION  06/20/2011   Procedure: TRANSESOPHAGEAL ECHOCARDIOGRAM (TEE);  Surgeon: Larey Dresser, MD;  Location: [facility] ENDOSCOPY;  Service: Cardiovascular;  Laterality: N/A;    Current Medications: Current Meds  Medication Sig  . allopurinol (ZYLOPRIM) 100 MG tablet TAKE 2 TABLETS BY MOUTH DAILY. D/C PREVIOUS SCRIPTS FOR THIS MEDICATION  . amiodarone (PACERONE) 100 MG tablet Take 1 tablet (100 mg total) by mouth daily.  Marland Kitchen atorvastatin (LIPITOR) 40 MG tablet Take two tablets ( 80 mg total ) by mouth daily  . benazepril (LOTENSIN) 10 MG tablet Take 1 tablet (10 mg total) by mouth daily.  . fluticasone (FLONASE) 50 MCG/ACT nasal spray Place 2  sprays into both nostrils daily.  . furosemide (LASIX) 40 MG tablet TAKE 1 TABLET (40 MG TOTAL) BY MOUTH EVERY OTHER DAY.  . isosorbide mononitrate (IMDUR) 30 MG 24 hr tablet Take 0.5 tablets (15 mg total) by mouth daily.  Marland Kitchen levothyroxine (SYNTHROID, LEVOTHROID) 112 MCG tablet Take 1 tablet (112 mcg total) by mouth daily before breakfast.  . metFORMIN (GLUCOPHAGE) 500 MG tablet Take by mouth daily with breakfast.  . Multiple Vitamins-Minerals (MULTIVITAMIN GUMMIES MENS PO) Take 1 tablet by mouth daily.  . nitroGLYCERIN (NITROSTAT) 0.4 MG SL tablet Place 1 tablet (0.4 mg total) under the tongue every 5 (five) minutes as needed for chest pain. Up to 3 doses  . pantoprazole (PROTONIX) 40 MG tablet Take 1 tablet (40 mg total) by mouth daily.  . traMADol (ULTRAM) 50 MG tablet Take 1 tablet (50 mg total) by mouth 2 (two) times daily.  . traMADol (ULTRAM-ER) 200 MG 24 hr tablet TAKE 1 TABLET EVERY DAY WITH AM DOSE OF TRAMADOL  50 MG  . vitamin B-12 1000 MCG tablet Take 1 tablet (1,000 mcg total) by mouth daily.  Marland Kitchen warfarin (COUMADIN) 2.5 MG tablet Take warfarin 2.5mg  tonight, Saturday and Sunday and recheck INR in clinic Monday (Patient taking differently: Take 2.5 mg by mouth daily. )     Allergies:   Patient has no known allergies.   Social History   Social History  . Marital status: Married    Spouse name: [name]  . Number of children: N/A  . Years of education: 63   Occupational History  . retired Hydrographic surveyor   Social History Main Topics  . Smoking status: Never Smoker  . Smokeless tobacco: Never Used  . Alcohol use No     Comment: 1 beer a month  . Drug use: No  . Sexual activity: No   Other Topics Concern  . None   Social History Narrative   Pt lives with wife. Does have stairs, but patient doesn't use them. Pt has completed technical school     Family History:  The patient's family history includes Lung cancer in his father; Stroke in his mother.   ROS:     Please see the history of present illness.    ROS All other systems reviewed and are negative.   EKGs/Labs/Other Test Reviewed:    EKG:  EKG is  ordered today.  The ekg ordered today demonstrates NSR, HR 80, LAD, QTc 465 ms, no changes.   Recent Labs: 10/22/2015: Magnesium 2.1 12/12/2015: B Natriuretic Peptide 53.6; TSH 0.516 12/24/2015: ALT 39; BUN 17; Creatinine, Ser 1.10; Hemoglobin 13.4; Platelets 194.0; Potassium 4.1; Sodium 140   Recent Lipid Panel    Component Value Date/Time   CHOL 142 10/23/2015 0519   TRIG 127 10/23/2015 0519   HDL 42 10/23/2015 0519   CHOLHDL 3.4 10/23/2015 0519   VLDL 25 10/23/2015 0519   LDLCALC 75 10/23/2015 0519     Physical Exam:    VS:  BP 128/84   Pulse 80   Ht 6\' 3"  (1.905 m)   Wt 256 lb 1.9 oz (116.2 kg)   BMI 32.01 kg/m     Wt Readings from Last 3 Encounters:  04/21/16 256 lb 1.9 oz (116.2 kg)  03/25/16 254 lb 12.8 oz (115.6 kg)  02/20/16 257 lb 3.2 oz (116.7 kg)     Physical Exam  Constitutional: He is oriented to person, place, and time. He appears well-developed and well-nourished. No distress.  HENT:  Head: Normocephalic and atraumatic.  Eyes: No scleral icterus.  Neck: No JVD present.  Cardiovascular: Normal rate, regular rhythm and normal heart sounds.   No murmur heard. Pulmonary/Chest: Effort normal. He has no wheezes. He has no rales.  Abdominal: Soft. There is no tenderness.  Musculoskeletal: He exhibits no edema.  Neurological: He is alert and oriented to person, place, and time.  Skin: Skin is warm and dry.  Psychiatric: He has a normal mood and affect.    ASSESSMENT:    1. Coronary artery disease involving native coronary artery of native heart with angina pectoris ([place])   2. Chronic diastolic CHF (congestive heart failure) (HCC)   3. Persistent atrial fibrillation ([place])   4. Essential hypertension   5. Hyperlipidemia, unspecified hyperlipidemia type    PLAN:    In order of problems listed above:  1.  CAD - s/p CABG.  LHC in 8/17 with stable anatomy (3/4 grafts patent with 95% stenosis distal to L-LAD anastomosis) and medical  Rx continued.  He denies angina.  He is not on ASA as he is on Coumadin.  Continue statin.    2. Chronic diastolic CHF - Volume stable.  Continue current Rx.  3. Persistent AF - Maintaining NSR.  He remains on low dose Amiodarone and Coumadin.  ALT was ok in 10/17 and TSH was ok in 9/17.  Continue current rx.  4. HTN - BP controlled.   5. HL - LDL was 75 in 8/17.  Continue statin.    Medication Adjustments/Labs and Tests Ordered: Current medicines are reviewed at length with the patient today.  Concerns regarding medicines are outlined above.  Medication changes, Labs and Tests ordered today are outlined in the Patient Instructions noted below. Patient Instructions  Medication Instructions:  Your physician recommends that you continue on your current medications as directed. Please refer to the Current Medication list given to you today.  Labwork: NONE  Testing/Procedures: NONE  Follow-Up: 08/25/16 @ 10 AM WITH DR. END   Any Other Special Instructions Will Be Listed Below (If Applicable).  If you need a refill on your cardiac medications before your next appointment, please call your pharmacy.  Signed, [name], PA-C  04/21/2016 9:34 AM    [facility] [address], [place], [place]  [address] Phone: [phone]; Fax: [phone]

## 2016-04-21 ENCOUNTER — Encounter: Payer: Self-pay | Admitting: Physician Assistant

## 2016-04-21 ENCOUNTER — Ambulatory Visit (INDEPENDENT_AMBULATORY_CARE_PROVIDER_SITE_OTHER): Payer: Medicare Other | Admitting: Physician Assistant

## 2016-04-21 VITALS — BP 128/84 | HR 80 | Ht 75.0 in | Wt 256.1 lb

## 2016-04-21 DIAGNOSIS — I5032 Chronic diastolic (congestive) heart failure: Secondary | ICD-10-CM | POA: Diagnosis not present

## 2016-04-21 DIAGNOSIS — E785 Hyperlipidemia, unspecified: Secondary | ICD-10-CM

## 2016-04-21 DIAGNOSIS — I481 Persistent atrial fibrillation: Secondary | ICD-10-CM | POA: Diagnosis not present

## 2016-04-21 DIAGNOSIS — I25119 Atherosclerotic heart disease of native coronary artery with unspecified angina pectoris: Secondary | ICD-10-CM

## 2016-04-21 DIAGNOSIS — I10 Essential (primary) hypertension: Secondary | ICD-10-CM | POA: Diagnosis not present

## 2016-04-21 DIAGNOSIS — I4819 Other persistent atrial fibrillation: Secondary | ICD-10-CM

## 2016-04-21 NOTE — Patient Instructions (Addendum)
Medication Instructions:  Your physician recommends that you continue on your current medications as directed. Please refer to the Current Medication list given to you today.  Labwork: NONE  Testing/Procedures: NONE  Follow-Up: 08/25/16 @ 10 AM WITH DR. END   Any Other Special Instructions Will Be Listed Below (If Applicable).  If you need a refill on your cardiac medications before your next appointment, please call your pharmacy.

## 2016-04-23 DIAGNOSIS — M48061 Spinal stenosis, lumbar region without neurogenic claudication: Secondary | ICD-10-CM | POA: Diagnosis not present

## 2016-04-23 DIAGNOSIS — M461 Sacroiliitis, not elsewhere classified: Secondary | ICD-10-CM | POA: Diagnosis not present

## 2016-04-24 ENCOUNTER — Ambulatory Visit: Payer: Medicare Other | Admitting: Neurology

## 2016-04-28 ENCOUNTER — Other Ambulatory Visit: Payer: Self-pay | Admitting: Physician Assistant

## 2016-04-28 ENCOUNTER — Encounter: Payer: Self-pay | Admitting: Neurology

## 2016-05-05 ENCOUNTER — Telehealth: Payer: Self-pay | Admitting: Family

## 2016-05-05 NOTE — Telephone Encounter (Signed)
Patient scheduled AWV 05/21/16 at 9:30am with Hoyle Sauer and follow up with PCP at 10:45am.

## 2016-05-19 ENCOUNTER — Ambulatory Visit (INDEPENDENT_AMBULATORY_CARE_PROVIDER_SITE_OTHER): Payer: Medicare Other | Admitting: *Deleted

## 2016-05-19 DIAGNOSIS — Z7901 Long term (current) use of anticoagulants: Secondary | ICD-10-CM | POA: Diagnosis not present

## 2016-05-19 DIAGNOSIS — I4891 Unspecified atrial fibrillation: Secondary | ICD-10-CM | POA: Diagnosis not present

## 2016-05-19 DIAGNOSIS — I25119 Atherosclerotic heart disease of native coronary artery with unspecified angina pectoris: Secondary | ICD-10-CM | POA: Diagnosis not present

## 2016-05-19 LAB — POCT INR: INR: 2.7

## 2016-05-20 NOTE — Progress Notes (Deleted)
Subjective:   [name] is a 71 y.o. male who presents for an Initial Medicare Annual Wellness Visit.  Review of Systems  No ROS.  Medicare Wellness Visit.     Sleep patterns: {SX; SLEEP PATTERNS:18802::"feels rested on waking","does not get up to void","gets up *** times nightly to void","sleeps *** hours nightly"}.   Home Safety/Smoke Alarms:   Living environment; residence and Firearm Safety: {Rehab home environment / accessibility:30080::"no firearms","firearms stored safely"}. Seat Belt Safety/Bike Helmet: Wears seat belt.   Counseling:   Eye Exam-  Dental-  Male:   CCS- last 05/22/10. No report on file.    PSA-  Lab Results  Component Value Date   PSA 0.19 08/16/2014   PSA 0.25 12/11/2011   PSA 0.23 07/21/2011       Objective:    There were no vitals filed for this visit. There is no height or weight on file to calculate BMI.  Current Medications (verified) Outpatient Encounter Prescriptions as of 05/21/2016  Medication Sig  . allopurinol (ZYLOPRIM) 100 MG tablet TAKE 2 TABLETS BY MOUTH DAILY. D/C PREVIOUS SCRIPTS FOR THIS MEDICATION  . amiodarone (PACERONE) 100 MG tablet Take 1 tablet (100 mg total) by mouth daily.  Marland Kitchen atorvastatin (LIPITOR) 40 MG tablet TAKE 2 TABLETS (80 MG TOTAL) BY MOUTH DAILY.  . benazepril (LOTENSIN) 10 MG tablet Take 1 tablet (10 mg total) by mouth daily.  . fluticasone (FLONASE) 50 MCG/ACT nasal spray Place 2 sprays into both nostrils daily.  . furosemide (LASIX) 40 MG tablet TAKE 1 TABLET (40 MG TOTAL) BY MOUTH EVERY OTHER DAY.  . isosorbide mononitrate (IMDUR) 30 MG 24 hr tablet Take 0.5 tablets (15 mg total) by mouth daily.  Marland Kitchen levothyroxine (SYNTHROID, LEVOTHROID) 112 MCG tablet Take 1 tablet (112 mcg total) by mouth daily before breakfast.  . metFORMIN (GLUCOPHAGE) 500 MG tablet Take by mouth daily with breakfast.  . Multiple Vitamins-Minerals (MULTIVITAMIN GUMMIES MENS PO) Take 1 tablet by mouth daily.  . nitroGLYCERIN  (NITROSTAT) 0.4 MG SL tablet Place 1 tablet (0.4 mg total) under the tongue every 5 (five) minutes as needed for chest pain. Up to 3 doses  . pantoprazole (PROTONIX) 40 MG tablet Take 1 tablet (40 mg total) by mouth daily.  . traMADol (ULTRAM) 50 MG tablet Take 1 tablet (50 mg total) by mouth 2 (two) times daily.  . traMADol (ULTRAM-ER) 200 MG 24 hr tablet TAKE 1 TABLET EVERY DAY WITH AM DOSE OF TRAMADOL 50 MG  . vitamin B-12 1000 MCG tablet Take 1 tablet (1,000 mcg total) by mouth daily.  Marland Kitchen warfarin (COUMADIN) 2.5 MG tablet Take warfarin 2.5mg  tonight, Saturday and Sunday and recheck INR in clinic Monday (Patient taking differently: Take 2.5 mg by mouth daily. )   No facility-administered encounter medications on file as of 05/21/2016.     Allergies (verified) Patient has no known allergies.   History: Past Medical History:  Diagnosis Date  . CAD (coronary artery disease)    s/p CABG, s/p DES to LCX  January 2011  . Chronic pain    left sided-[place]  . CVA (cerebral infarction)    left hemiparesis  . Diastolic CHF ([place])   . Dysrhythmia    hx of atrial fibrilation with cardioversion  . GERD (gastroesophageal reflux disease)   . Gout   . Hyperlipidemia   . Hypertension   . Hypothyroidism   . OSA on CPAP   . Sleep apnea   . Stroke ([place]) 1996  . Tubular  adenoma of colon 05/2010   Past Surgical History:  Procedure Laterality Date  . CARDIAC CATHETERIZATION N/A 10/25/2015   Procedure: Left Heart Cath and Cors/Grafts Angiography;  Surgeon: Troy Sine, MD;  Location: [place] [facility];  Service: Cardiovascular;  Laterality: N/A;  . CARDIOVERSION  06/20/2011   Procedure: CARDIOVERSION;  Surgeon: Larey Dresser, MD;  Location: [facility] ENDOSCOPY;  Service: Cardiovascular;  Laterality: N/A;  . CARDIOVERSION N/A 04/26/2015   Procedure: CARDIOVERSION;  Surgeon: Dorothy Spark, MD;  Location: [facility] ENDOSCOPY;  Service: Cardiovascular;  Laterality: N/A;  . CARDIOVERSION N/A 05/10/2015    Procedure: CARDIOVERSION;  Surgeon: Larey Dresser, MD;  Location: [place];  Service: Cardiovascular;  Laterality: N/A;  . CATARACT EXTRACTION  05/2010   left eye  . CATARACT EXTRACTION  04/2010   right eye  . CORONARY ARTERY BYPASS GRAFT     stent  . ESOPHAGOGASTRODUODENOSCOPY  03-25-2005  . NASAL SEPTUM SURGERY    . PERCUTANEOUS PLACEMENT INTRAVASCULAR STENT CERVICAL CAROTID ARTERY     03-2009; using a drug-eluting platform of the circumflex cornoray artery with a 3.0 x 18 [employer] Promus drug-eluting platform post dilated to 3.75 with a noncompliant balloon.  . TEE WITHOUT CARDIOVERSION  06/20/2011   Procedure: TRANSESOPHAGEAL ECHOCARDIOGRAM (TEE);  Surgeon: Larey Dresser, MD;  Location: [facility] ENDOSCOPY;  Service: Cardiovascular;  Laterality: N/A;   Family History  Problem Relation Age of Onset  . Lung cancer Father     deceased  . Stroke Mother     deceased-MINISTROKES   Social History   Occupational History  . retired Hydrographic surveyor   Social History Main Topics  . Smoking status: Never Smoker  . Smokeless tobacco: Never Used  . Alcohol use No     Comment: 1 beer a month  . Drug use: No  . Sexual activity: No   Tobacco Counseling Counseling given: Not Answered   Activities of Daily Living In your present state of health, do you have any difficulty performing the following activities: 12/12/2015 10/22/2015  Hearing? N N  Vision? N N  Difficulty concentrating or making decisions? N N  Walking or climbing stairs? Y N  Dressing or bathing? N N  Doing errands, shopping? N N  Some recent data might be hidden    Immunizations and Health Maintenance Immunization History  Administered Date(s) Administered  . Influenza Split 12/16/2010, 03/05/2012  . Influenza Whole 12/21/2007, 12/26/2008, 12/20/2009, 12/26/2009  . Influenza,inj,Quad PF,36+ Mos 12/17/2012, 12/02/2013, 12/01/2014, 12/13/2015  . Pneumococcal Conjugate-13 02/15/2013  . Pneumococcal  Polysaccharide-23 03/21/2009  . Td 01/29/2009  . Tdap 06/27/2013  . Zoster 11/26/2010   Health Maintenance Due  Topic Date Due  . FOOT EXAM  05/14/1955  . OPHTHALMOLOGY EXAM  10/16/2014  . COLONOSCOPY  05/22/2015  . HEMOGLOBIN A1C  04/23/2016    Patient Care Team: Debbrah Alar, NP as PCP - General (Internal Medicine) Lennon Alstrom, MD as Consulting Physician (Neurology) Charlett Blake, MD as Consulting Physician (Physical Medicine and Rehabilitation)  Indicate any recent Medical Services you may have received from other than [facility] providers in the past year (date may be approximate).    Assessment:   This is a routine wellness examination for Darean. Physical assessment deferred to PCP.  Hearing/Vision screen No exam data present  Dietary issues and exercise activities discussed:     Diet (meal preparation, eat out, water intake, caffeinated beverages, dairy products, fruits and vegetables): {Desc; diets:16563} Breakfast: Lunch:  Dinner:       Goals    None     Depression Screen PHQ 2/9 Scores 03/05/2015 01/01/2015 06/06/2014 03/24/2014  PHQ - 2 Score 0 3 0 0  PHQ- 9 Score - 4 3 -    Fall Risk Fall Risk  03/25/2016 10/02/2015 07/03/2015 04/03/2015 03/05/2015  Falls in the past year? Yes No No No Yes  Number falls in past yr: 1 - - - 1  Injury with Fall? No - - - No  Risk for fall due to : Impaired balance/gait - - - History of fall(s);Impaired balance/gait    Cognitive Function:        Screening Tests Health Maintenance  Topic Date Due  . FOOT EXAM  05/14/1955  . OPHTHALMOLOGY EXAM  10/16/2014  . COLONOSCOPY  05/22/2015  . HEMOGLOBIN A1C  04/23/2016  . TETANUS/TDAP  06/28/2023  . INFLUENZA VACCINE  Completed  . Hepatitis C Screening  Completed  . PNA vac Low Risk Adult  Completed        Plan:    Follow-up w/ PCP as directed.  During the course of the visit Schneider was educated and counseled about the following appropriate screening and  preventive services:   Vaccines to include Pneumoccal, Influenza, Hepatitis B, Td, Zostavax, HCV  Colorectal cancer screening  Cardiovascular disease screening  Diabetes screening  Glaucoma screening  Nutrition counseling  Prostate cancer screening  Smoking cessation counseling  Patient Instructions (the written plan) were given to the patient.   Dorrene German, RN   05/20/2016

## 2016-05-20 NOTE — Progress Notes (Signed)
Pre visit review using our clinic review tool, if applicable. No additional management support is needed unless otherwise documented below in the visit note. 

## 2016-05-21 ENCOUNTER — Encounter: Payer: Self-pay | Admitting: Family

## 2016-05-21 ENCOUNTER — Ambulatory Visit (INDEPENDENT_AMBULATORY_CARE_PROVIDER_SITE_OTHER): Payer: Medicare Other | Admitting: Family

## 2016-05-21 VITALS — BP 146/77 | HR 62 | Temp 98.1°F | Resp 16 | Ht 75.0 in | Wt 259.2 lb

## 2016-05-21 DIAGNOSIS — Z0000 Encounter for general adult medical examination without abnormal findings: Secondary | ICD-10-CM

## 2016-05-21 DIAGNOSIS — M109 Gout, unspecified: Secondary | ICD-10-CM | POA: Diagnosis not present

## 2016-05-21 DIAGNOSIS — E118 Type 2 diabetes mellitus with unspecified complications: Secondary | ICD-10-CM

## 2016-05-21 DIAGNOSIS — I25119 Atherosclerotic heart disease of native coronary artery with unspecified angina pectoris: Secondary | ICD-10-CM | POA: Diagnosis not present

## 2016-05-21 DIAGNOSIS — I10 Essential (primary) hypertension: Secondary | ICD-10-CM | POA: Diagnosis not present

## 2016-05-21 DIAGNOSIS — E039 Hypothyroidism, unspecified: Secondary | ICD-10-CM

## 2016-05-21 LAB — BASIC METABOLIC PANEL
BUN: 16 mg/dL (ref 6–23)
CO2: 30 meq/L (ref 19–32)
Calcium: 9.5 mg/dL (ref 8.4–10.5)
Chloride: 103 mEq/L (ref 96–112)
Creatinine, Ser: 1.08 mg/dL (ref 0.40–1.50)
GFR: 71.23 mL/min (ref >60.00)
GLUCOSE: 115 mg/dL — AB (ref 70–99)
POTASSIUM: 4.4 meq/L (ref 3.5–5.1)
Sodium: 139 mEq/L (ref 135–145)

## 2016-05-21 LAB — HEMOGLOBIN A1C: Hgb A1c MFr Bld: 6.4 % (ref 4.6–6.5)

## 2016-05-21 LAB — TSH: TSH: 0.7 u[IU]/mL (ref 0.35–4.50)

## 2016-05-21 MED ORDER — BENAZEPRIL HCL 10 MG PO TABS: 10.0000 mg | ORAL_TABLET | Freq: Every day | ORAL | 0 refills | Status: DC

## 2016-05-21 MED ORDER — FUROSEMIDE 40 MG PO TABS: 40.0000 mg | ORAL_TABLET | ORAL | 1 refills | Status: DC

## 2016-05-21 MED ORDER — LEVOTHYROXINE SODIUM 112 MCG PO TABS: 112.0000 ug | ORAL_TABLET | Freq: Every day | ORAL | 0 refills | Status: DC

## 2016-05-21 MED ORDER — PANTOPRAZOLE SODIUM 40 MG PO TBEC: 40.0000 mg | DELAYED_RELEASE_TABLET | Freq: Every day | ORAL | 1 refills | Status: DC

## 2016-05-21 MED ORDER — ALLOPURINOL 100 MG PO TABS: ORAL_TABLET | ORAL | 1 refills | Status: DC

## 2016-05-21 MED ORDER — METFORMIN HCL 500 MG PO TABS: 500.0000 mg | ORAL_TABLET | Freq: Every day | ORAL | 1 refills | Status: DC

## 2016-05-21 NOTE — Progress Notes (Signed)
Pre visit review using our clinic review tool, if applicable. No additional management support is needed unless otherwise documented below in the visit note. 

## 2016-05-21 NOTE — Patient Instructions (Addendum)
Please go to the lab.   Call [facility] at [phone] to schedule your colonoscopy.   Advance Directive Advance directives are legal documents that let you make choices ahead of time about your health care and medical treatment in case you become unable to communicate for yourself. Advance directives are a way for you to communicate your wishes to family, friends, and health care providers. This can help convey your decisions about end-of-life care if you become unable to communicate. Discussing and writing advance directives should happen over time rather than all at once. Advance directives can be changed depending on your situation and what you want, even after you have signed the advance directives. If you do not have an advance directive, some states assign family decision makers to act on your behalf based on how closely you are related to them. Each state has its own laws regarding advance directives. You may want to check with your health care provider, attorney, or state representative about the laws in your state. There are different types of advance directives, such as:  Medical power of attorney.  Living will.  Do not resuscitate (DNR) or do not attempt resuscitation (DNAR) order. Health care proxy and medical power of attorney A health care proxy, also called a health care agent, is a person who is appointed to make medical decisions for you in cases in which you are unable to make the decisions yourself. Generally, people choose someone they know well and trust to represent their preferences. Make sure to ask this person for an agreement to act as your proxy. A proxy may have to exercise judgment in the event of a medical decision for which your wishes are not known. A medical power of attorney is a legal document that names your health care proxy. Depending on the laws in your state, after the document is written, it may also need to  be:  Signed.  Notarized.  Dated.  Copied.  Witnessed.  Incorporated into your medical record. You may also want to appoint someone to manage your financial affairs in a situation in which you are unable to do so. This is called a durable power of attorney for finances. It is a separate legal document from the durable power of attorney for health care. You may choose the same person or someone different from your health care proxy to act as your agent in financial matters. If you do not appoint a proxy, or if there is a concern that the proxy is not acting in your best interests, a court-appointed guardian may be designated to act on your behalf. Living will A living will is a set of instructions documenting your wishes about medical care when you cannot express them yourself. Health care providers should keep a copy of your living will in your medical record. You may want to give a copy to family members or friends. To alert caregivers in case of an emergency, you can place a card in your wallet to let them know that you have a living will and where they can find it. A living will is used if you become:  Terminally ill.  Incapacitated.  Unable to communicate or make decisions. Items to consider in your living will include:  The use or non-use of life-sustaining equipment, such as dialysis machines and breathing machines (ventilators).  A DNR or DNAR order, which is the instruction not to use cardiopulmonary resuscitation (CPR) if breathing or heartbeat stops.  The use or non-use of tube  feeding.  Withholding of food and fluids.  Comfort (palliative) care when the goal becomes comfort rather than a cure.  Organ and tissue donation. A living will does not give instructions for distributing your money and property if you should pass away. It is recommended that you seek the advice of a lawyer when writing a will. Decisions about taxes, beneficiaries, and asset distribution will be  legally binding. This process can relieve your family and friends of any concerns surrounding disputes or questions that may come up about the distribution of your assets. DNR or DNAR A DNR or DNAR order is a request not to have CPR in the event that your heart stops beating or you stop breathing. If a DNR or DNAR order has not been made and shared, a health care provider will try to help any patient whose heart has stopped or who has stopped breathing. If you plan to have surgery, talk with your health care provider about how your DNR or DNAR order will be followed if problems occur. Summary  Advance directives are the legal documents that allow you to make choices ahead of time about your health care and medical treatment in case you become unable to communicate for yourself.  The process of discussing and writing advance directives should happen over time. You can change the advance directives, even after you have signed them.  Advance directives include DNR or DNAR orders, living wills, and designating an agent as your medical power of attorney. This information is not intended to replace advice given to you by your health care provider. Make sure you discuss any questions you have with your health care provider. Document Released: 06/10/2007 Document Revised: 01/21/2016 Document Reviewed: 01/21/2016 Elsevier Interactive Patient Education  2017 [employer].

## 2016-05-21 NOTE — Progress Notes (Signed)
Subjective:   [name] is a 71 y.o. male who presents for an Initial Medicare Annual Wellness Visit.  The Patient was informed that the wellness visit is to identify future health risk and educate and initiate measures that can reduce risk for increased disease through the lifespan.   Describes health as fair, good or great? "Somewhere between fair and good."   Review of Systems  No ROS.  Medicare Wellness Visit.  Cardiac Risk Factors include: advanced age (>106men, >40 women);diabetes mellitus;dyslipidemia;hypertension;male gender;obesity (BMI >30kg/m2)  Sleep patterns: falls asleep easily, has frequent nighttime awakenings, is not rested upon awakening, has snoring and gets up 1 times nightly to void. Wears 2L O2 at night-failed CPAP.   Home Safety/Smoke Alarms: Feels safe in home. Smoke alarms in place.  Living environment; residence and Firearm Safety: Lives w/ wife, daughter and son in law and their 2 kids. Daughter and her family will be moving out in a few months. He is looking forward to returning to a normal routine when they move out as he has found it somewhat disruptive having so many extra people in the house.  1-story house/ trailer, equipment: Walkers, Type: Conservation officer, nature, firearms stored safely. Seat Belt Safety/Bike Helmet: Wears seat belt.   Counseling:   Eye Exam- Follows w/ eye doctor yearly but does not know name of provider. Due for exam. Dental- Follows w/ dentist yearly. Planning next visit for the summer to have some extractions.  Male:   CCS- last 05/22/10. 1 polyp removed, internal hemorrhoids. 5 year recall.       PSA- Ongoing screening recommendations deferred to PCP. Lab Results  Component Value Date   PSA 0.19 08/16/2014   PSA 0.25 12/11/2011   PSA 0.23 07/21/2011     Objective:    Today's Vitals   05/21/16 1053 05/21/16 1141  BP: (!) 146/77   Pulse: 62   Resp: 16   Temp: 98.1 F (36.7 C)   TempSrc: Oral   SpO2: 98%   Weight: 259 lb  3.2 oz (117.6 kg)   Height: 6\' 3"  (1.905 m)   PainSc:  3    Body mass index is 32.4 kg/m.  BP Readings from Last 3 Encounters:  05/21/16 (!) 146/77  04/21/16 128/84  03/25/16 (!) 141/84    Current Medications (verified) Outpatient Encounter Prescriptions as of 05/21/2016  Medication Sig  . allopurinol (ZYLOPRIM) 100 MG tablet TAKE 2 TABLETS BY MOUTH DAILY. D/C PREVIOUS SCRIPTS FOR THIS MEDICATION  . amiodarone (PACERONE) 100 MG tablet Take 1 tablet (100 mg total) by mouth daily.  Marland Kitchen atorvastatin (LIPITOR) 40 MG tablet TAKE 2 TABLETS (80 MG TOTAL) BY MOUTH DAILY.  . benazepril (LOTENSIN) 10 MG tablet Take 1 tablet (10 mg total) by mouth daily.  . fluticasone (FLONASE) 50 MCG/ACT nasal spray Place 2 sprays into both nostrils daily.  . furosemide (LASIX) 40 MG tablet Take 1 tablet (40 mg total) by mouth every other day.  . isosorbide mononitrate (IMDUR) 30 MG 24 hr tablet Take 0.5 tablets (15 mg total) by mouth daily.  Marland Kitchen levothyroxine (SYNTHROID, LEVOTHROID) 112 MCG tablet Take 1 tablet (112 mcg total) by mouth daily before breakfast.  . metFORMIN (GLUCOPHAGE) 500 MG tablet Take 1 tablet (500 mg total) by mouth daily with breakfast.  . Multiple Vitamins-Minerals (MULTIVITAMIN GUMMIES MENS PO) Take 1 tablet by mouth daily.  . nitroGLYCERIN (NITROSTAT) 0.4 MG SL tablet Place 1 tablet (0.4 mg total) under the tongue every 5 (five) minutes as needed  for chest pain. Up to 3 doses  . pantoprazole (PROTONIX) 40 MG tablet Take 1 tablet (40 mg total) by mouth daily.  . traMADol (ULTRAM) 50 MG tablet Take 1 tablet (50 mg total) by mouth 2 (two) times daily.  . traMADol (ULTRAM-ER) 200 MG 24 hr tablet TAKE 1 TABLET EVERY DAY WITH AM DOSE OF TRAMADOL 50 MG  . vitamin B-12 1000 MCG tablet Take 1 tablet (1,000 mcg total) by mouth daily.  Marland Kitchen warfarin (COUMADIN) 2.5 MG tablet Take warfarin 2.5mg  tonight, Saturday and Sunday and recheck INR in clinic Monday (Patient taking differently: Take 2.5 mg by mouth  daily. )  . [DISCONTINUED] allopurinol (ZYLOPRIM) 100 MG tablet TAKE 2 TABLETS BY MOUTH DAILY. D/C PREVIOUS SCRIPTS FOR THIS MEDICATION  . [DISCONTINUED] benazepril (LOTENSIN) 10 MG tablet Take 1 tablet (10 mg total) by mouth daily.  . [DISCONTINUED] furosemide (LASIX) 40 MG tablet TAKE 1 TABLET (40 MG TOTAL) BY MOUTH EVERY OTHER DAY.  . [DISCONTINUED] levothyroxine (SYNTHROID, LEVOTHROID) 112 MCG tablet Take 1 tablet (112 mcg total) by mouth daily before breakfast.  . [DISCONTINUED] metFORMIN (GLUCOPHAGE) 500 MG tablet Take by mouth daily with breakfast.  . [DISCONTINUED] pantoprazole (PROTONIX) 40 MG tablet Take 1 tablet (40 mg total) by mouth daily.   No facility-administered encounter medications on file as of 05/21/2016.     Allergies (verified) Patient has no known allergies.   History: Past Medical History:  Diagnosis Date  . CAD (coronary artery disease)    s/p CABG, s/p DES to LCX  January 2011  . Chronic pain    left sided-Kristeins  . CVA (cerebral infarction)    left hemiparesis  . Diastolic CHF ([place])   . Dysrhythmia    hx of atrial fibrilation with cardioversion  . GERD (gastroesophageal reflux disease)   . Gout   . Hyperlipidemia   . Hypertension   . Hypothyroidism   . OSA on CPAP   . Sleep apnea   . Stroke ([place]) 1996  . Tubular adenoma of colon 05/2010   Past Surgical History:  Procedure Laterality Date  . CARDIAC CATHETERIZATION N/A 10/25/2015   Procedure: Left Heart Cath and Cors/Grafts Angiography;  Surgeon: Troy Sine, MD;  Location: [place] [facility];  Service: Cardiovascular;  Laterality: N/A;  . CARDIOVERSION  06/20/2011   Procedure: CARDIOVERSION;  Surgeon: Larey Dresser, MD;  Location: [facility] ENDOSCOPY;  Service: Cardiovascular;  Laterality: N/A;  . CARDIOVERSION N/A 04/26/2015   Procedure: CARDIOVERSION;  Surgeon: Dorothy Spark, MD;  Location: [facility] ENDOSCOPY;  Service: Cardiovascular;  Laterality: N/A;  . CARDIOVERSION N/A 05/10/2015   Procedure:  CARDIOVERSION;  Surgeon: Larey Dresser, MD;  Location: [place];  Service: Cardiovascular;  Laterality: N/A;  . CATARACT EXTRACTION  05/2010   left eye  . CATARACT EXTRACTION  04/2010   right eye  . CORONARY ARTERY BYPASS GRAFT     stent  . ESOPHAGOGASTRODUODENOSCOPY  03-25-2005  . NASAL SEPTUM SURGERY    . PERCUTANEOUS PLACEMENT INTRAVASCULAR STENT CERVICAL CAROTID ARTERY     03-2009; using a drug-eluting platform of the circumflex cornoray artery with a 3.0 x 18 [employer] Promus drug-eluting platform post dilated to 3.75 with a noncompliant balloon.  . TEE WITHOUT CARDIOVERSION  06/20/2011   Procedure: TRANSESOPHAGEAL ECHOCARDIOGRAM (TEE);  Surgeon: Larey Dresser, MD;  Location: [facility] ENDOSCOPY;  Service: Cardiovascular;  Laterality: N/A;   Family History  Problem Relation Age of Onset  . Lung cancer Father     deceased  .  Stroke Mother     deceased-MINISTROKES   Social History   Occupational History  . retired Hydrographic surveyor   Social History Main Topics  . Smoking status: Never Smoker  . Smokeless tobacco: Never Used  . Alcohol use No     Comment: 1 beer a month  . Drug use: No  . Sexual activity: No   Tobacco Counseling Counseling given: Not Answered   Activities of Daily Living In your present state of health, do you have any difficulty performing the following activities: 05/21/2016 12/12/2015  Hearing? N N  Vision? N N  Difficulty concentrating or making decisions? N N  Walking or climbing stairs? N Y  Dressing or bathing? N N  Doing errands, shopping? N N  Preparing Food and eating ? N -  Using the Toilet? N -  In the past six months, have you accidently leaked urine? N -  Do you have problems with loss of bowel control? N -  Managing your Medications? N -  Managing your Finances? N -  Housekeeping or managing your Housekeeping? N -  Some recent data might be hidden    Immunizations and Health Maintenance Immunization History    Administered Date(s) Administered  . Influenza Split 12/16/2010, 03/05/2012  . Influenza Whole 12/21/2007, 12/26/2008, 12/20/2009, 12/26/2009  . Influenza,inj,Quad PF,36+ Mos 12/17/2012, 12/02/2013, 12/01/2014, 12/13/2015  . Pneumococcal Conjugate-13 02/15/2013  . Pneumococcal Polysaccharide-23 03/21/2009  . Td 01/29/2009  . Tdap 06/27/2013  . Zoster 11/26/2010   Health Maintenance Due  Topic Date Due  . FOOT EXAM  05/14/1955  . COLONOSCOPY  05/22/2015  . HEMOGLOBIN A1C  04/23/2016    Patient Care Team: Debbrah Alar, NP as PCP - General (Internal Medicine) Charlett Blake, MD as Consulting Physician (Physical Medicine and Rehabilitation) Penni Bombard, MD as Consulting Physician (Neurology) Larey Dresser, MD as Consulting Physician (Cardiology)  Indicate any recent Medical Services you may have received from other than [facility] providers in the past year (date may be approximate).    Assessment:   This is a routine wellness examination for [name]. Physical assessment deferred to PCP.  Hearing/Vision screen Hearing Screening Comments: Able to hear conversational tones w/o difficulty. Normally wears hearing aids bilaterally, but is not wearing them today.  Vision Screening Comments: Wearing glasses. Follows w/ eye doctor yearly, but does not know the name of the provider.  Dietary issues and exercise activities discussed: Current Exercise Habits: The patient does not participate in regular exercise at present, Exercise limited by: neurologic condition(s) (L sided residuals from previous stroke)  Diet (meal preparation, eat out, water intake, caffeinated beverages, dairy products, fruits and vegetables): in general, a "healthy" diet  , well balanced, on average, 2 meals per day. Wife and daughter do most of the cooking. Meals vary-spahgetti w/ meat balls, steak and baked potatoes, baked chicken w/ cauliflower, etc. Does not like water, so drinks mostly sweet tea.        Goals    . Increase water intake      Depression Screen PHQ 2/9 Scores 05/21/2016 03/05/2015 01/01/2015 06/06/2014  PHQ - 2 Score 0 0 3 0  PHQ- 9 Score 2 - 4 3    Fall Risk Fall Risk  05/21/2016 03/25/2016 10/02/2015 07/03/2015 04/03/2015  Falls in the past year? Yes Yes No No No  Number falls in past yr: 2 or more 1 - - -  Injury with Fall? No No - - -  Risk for fall  due to : - Impaired balance/gait - - -    Cognitive Function: MMSE - Mini Mental State Exam 05/21/2016  Orientation to time 4  Orientation to Place 5  Registration 3  Attention/ Calculation 4  Recall 2  Language- name 2 objects 2  Language- repeat 1  Language- follow 3 step command 3  Language- read & follow direction 1  Write a sentence 1  Copy design 1  Total score 27        Screening Tests Health Maintenance  Topic Date Due  . FOOT EXAM  05/14/1955  . COLONOSCOPY  05/22/2015  . HEMOGLOBIN A1C  04/23/2016  . OPHTHALMOLOGY EXAM  11/21/2016 (Originally 10/16/2014)  . TETANUS/TDAP  06/28/2023  . INFLUENZA VACCINE  Completed  . Hepatitis C Screening  Completed  . PNA vac Low Risk Adult  Completed        Plan:   Follow-up w/ PCP as directed.  Opthalmology referral placed per PCP.   Schedule colonoscopy w/ [facility].  During the course of the visit [name] was educated and counseled about the following appropriate screening and preventive services:   Vaccines to include Pneumoccal, Influenza, Hepatitis B, Td, Zostavax, HCV  Colorectal cancer screening  Cardiovascular disease screening  Diabetes screening  Glaucoma screening  Nutrition counseling  Patient Instructions (the written plan) were given to the patient.   Dorrene German, RN   05/21/2016

## 2016-05-21 NOTE — Progress Notes (Signed)
Reviewed and agree.

## 2016-05-21 NOTE — Progress Notes (Signed)
Subjective:    Patient ID: [name], male    DOB: 08/19/1945, 71 y.o.   MRN: [id number]  HPI  Mr. Creason is a 71 yr old male who presents today for follow up.  1) DM2- maintained on metformin.   Lab Results  Component Value Date   HGBA1C 5.8 (H) 10/22/2015   HGBA1C 6.5 06/04/2015   HGBA1C 6.3 03/05/2015   Lab Results  Component Value Date   MICROALBUR <0.7 09/26/2014   LDLCALC 75 10/23/2015   CREATININE 1.10 12/24/2015   2) HTN-  BP Readings from Last 3 Encounters:  05/21/16 (!) 146/77  04/21/16 128/84  03/25/16 (!) 141/84   3) Hypothyroid- continues synthroid.  Lab Results  Component Value Date   TSH 0.516 12/12/2015   4) Gout- stable on allopurinol, no flares.   Review of Systems See HPI  Past Medical History:  Diagnosis Date  . CAD (coronary artery disease)    s/p CABG, s/p DES to LCX  January 2011  . Chronic pain    left sided-Kristeins  . CVA (cerebral infarction)    left hemiparesis  . Diastolic CHF ([place])   . Dysrhythmia    hx of atrial fibrilation with cardioversion  . GERD (gastroesophageal reflux disease)   . Gout   . Hyperlipidemia   . Hypertension   . Hypothyroidism   . OSA on CPAP   . Sleep apnea   . Stroke ([place]) 1996  . Tubular adenoma of colon 05/2010     Social History   Social History  . Marital status: Married    Spouse name: [name]  . Number of children: N/A  . Years of education: 54   Occupational History  . retired Hydrographic surveyor   Social History Main Topics  . Smoking status: Never Smoker  . Smokeless tobacco: Never Used  . Alcohol use No     Comment: 1 beer a month  . Drug use: No  . Sexual activity: No   Other Topics Concern  . Not on file   Social History Narrative   Pt lives with wife. Does have stairs, but patient doesn't use them. Pt has completed technical school    Past Surgical History:  Procedure Laterality Date  . CARDIAC CATHETERIZATION N/A 10/25/2015   Procedure: Left Heart  Cath and Cors/Grafts Angiography;  Surgeon: Troy Sine, MD;  Location: [place] [facility];  Service: Cardiovascular;  Laterality: N/A;  . CARDIOVERSION  06/20/2011   Procedure: CARDIOVERSION;  Surgeon: Larey Dresser, MD;  Location: [facility] ENDOSCOPY;  Service: Cardiovascular;  Laterality: N/A;  . CARDIOVERSION N/A 04/26/2015   Procedure: CARDIOVERSION;  Surgeon: Dorothy Spark, MD;  Location: [facility] ENDOSCOPY;  Service: Cardiovascular;  Laterality: N/A;  . CARDIOVERSION N/A 05/10/2015   Procedure: CARDIOVERSION;  Surgeon: Larey Dresser, MD;  Location: [place];  Service: Cardiovascular;  Laterality: N/A;  . CATARACT EXTRACTION  05/2010   left eye  . CATARACT EXTRACTION  04/2010   right eye  . CORONARY ARTERY BYPASS GRAFT     stent  . ESOPHAGOGASTRODUODENOSCOPY  03-25-2005  . NASAL SEPTUM SURGERY    . PERCUTANEOUS PLACEMENT INTRAVASCULAR STENT CERVICAL CAROTID ARTERY     03-2009; using a drug-eluting platform of the circumflex cornoray artery with a 3.0 x 18 [employer] Promus drug-eluting platform post dilated to 3.75 with a noncompliant balloon.  . TEE WITHOUT CARDIOVERSION  06/20/2011   Procedure: TRANSESOPHAGEAL ECHOCARDIOGRAM (TEE);  Surgeon: Larey Dresser, MD;  Location: [facility] ENDOSCOPY;  Service: Cardiovascular;  Laterality: N/A;    Family History  Problem Relation Age of Onset  . Lung cancer Father     deceased  . Stroke Mother     deceased-MINISTROKES    No Known Allergies  Current Outpatient Prescriptions on File Prior to Visit  Medication Sig Dispense Refill  . allopurinol (ZYLOPRIM) 100 MG tablet TAKE 2 TABLETS BY MOUTH DAILY. D/C PREVIOUS SCRIPTS FOR THIS MEDICATION 180 tablet 1  . amiodarone (PACERONE) 100 MG tablet Take 1 tablet (100 mg total) by mouth daily. 90 tablet 3  . atorvastatin (LIPITOR) 40 MG tablet TAKE 2 TABLETS (80 MG TOTAL) BY MOUTH DAILY. 180 tablet 3  . benazepril (LOTENSIN) 10 MG tablet Take 1 tablet (10 mg total) by mouth daily. 90 tablet 0  .  fluticasone (FLONASE) 50 MCG/ACT nasal spray Place 2 sprays into both nostrils daily. 9.9 g 1  . furosemide (LASIX) 40 MG tablet TAKE 1 TABLET (40 MG TOTAL) BY MOUTH EVERY OTHER DAY. 45 tablet 1  . isosorbide mononitrate (IMDUR) 30 MG 24 hr tablet Take 0.5 tablets (15 mg total) by mouth daily. 30 tablet 6  . levothyroxine (SYNTHROID, LEVOTHROID) 112 MCG tablet Take 1 tablet (112 mcg total) by mouth daily before breakfast. 90 tablet 0  . metFORMIN (GLUCOPHAGE) 500 MG tablet Take by mouth daily with breakfast.    . Multiple Vitamins-Minerals (MULTIVITAMIN GUMMIES MENS PO) Take 1 tablet by mouth daily.    . nitroGLYCERIN (NITROSTAT) 0.4 MG SL tablet Place 1 tablet (0.4 mg total) under the tongue every 5 (five) minutes as needed for chest pain. Up to 3 doses 10 tablet 2  . pantoprazole (PROTONIX) 40 MG tablet Take 1 tablet (40 mg total) by mouth daily. 90 tablet 1  . traMADol (ULTRAM) 50 MG tablet Take 1 tablet (50 mg total) by mouth 2 (two) times daily. 180 tablet 1  . traMADol (ULTRAM-ER) 200 MG 24 hr tablet TAKE 1 TABLET EVERY DAY WITH AM DOSE OF TRAMADOL 50 MG 90 tablet 1  . vitamin B-12 1000 MCG tablet Take 1 tablet (1,000 mcg total) by mouth daily. 30 tablet 0  . warfarin (COUMADIN) 2.5 MG tablet Take warfarin 2.5mg  tonight, Saturday and Sunday and recheck INR in clinic Monday (Patient taking differently: Take 2.5 mg by mouth daily. ) 30 tablet 0   No current facility-administered medications on file prior to visit.     BP (!) 146/77 (BP Location: Right Arm, Cuff Size: Large)   Pulse 62   Temp 98.1 F (36.7 C) (Oral)   Resp 16   Ht 6\' 3"  (1.905 m)   Wt 259 lb 3.2 oz (117.6 kg)   SpO2 98% Comment: ROOM AIR  BMI 32.40 kg/m       Objective:   Physical Exam  Constitutional: He is oriented to person, place, and time. He appears well-developed and well-nourished. No distress.  HENT:  Head: Normocephalic and atraumatic.  Cardiovascular: Normal rate and regular rhythm.   No murmur  heard. Pulmonary/Chest: Effort normal and breath sounds normal. No respiratory distress. He has no wheezes. He has no rales.  Musculoskeletal: He exhibits no edema.  Neurological: He is alert and oriented to person, place, and time.  Skin: Skin is warm and dry.  Psychiatric: He has a normal mood and affect. His behavior is normal. Thought content normal.          Assessment & Plan:  HTN- Fair BP. contineu current medications.  DM2- clinically  stable on metformin, obtain A1C. Continue metformin.  Hypothyroid- stable on synthroid, obtain tsh.   Gout- stable on allopurinol, continue same.

## 2016-05-22 ENCOUNTER — Encounter: Payer: Self-pay | Admitting: Family

## 2016-05-26 ENCOUNTER — Telehealth: Payer: Self-pay | Admitting: Physical Medicine & Rehabilitation

## 2016-05-26 ENCOUNTER — Telehealth: Payer: Self-pay | Admitting: Family

## 2016-05-26 NOTE — Telephone Encounter (Signed)
OK to use flexeril prn but it may cause him some drowsiness so don't drive after taking.

## 2016-05-26 NOTE — Telephone Encounter (Signed)
Patient called and states no getting a good result with Tramadol - would like for Dr. To review

## 2016-05-26 NOTE — Telephone Encounter (Signed)
Spoke with pt. States he takes tramadol 200 and 50mg  one each morning since his stroke in 1996. Now needs something for pain in his back. Reports that he had a fall last year and it caused back pain. Was prescribed cyclobenzaprine 5mg  twice a day at that time and still has some at home (6 tablets). Wants to know if that is ok to take along with tramadol and if so, can we send additional Rx since he only has 6 tablets?

## 2016-05-26 NOTE — Telephone Encounter (Signed)
Caller name: [name] Relation to pt: self  Call back number: [place]  Reason for call: Pt states is having some concerns about his meds for cyclobenzaprine (FLEXERIL) tablet 5 mg and would like to speak with provider or nurse and also pt is requesting refill on traMADol (ULTRAM) 50 MG tablet. Please advise.

## 2016-05-26 NOTE — Telephone Encounter (Signed)
Notified pt. He is not requesting tramadol at this time, just wanted to make sure it would be ok to take cyclobenzaprine along with Tramadol. Advised pt of below recommendations and he voices understanding. Advised him per verbal from PCP that he should let us know if pain is not resolved after completing the cyclobenzaprine he has on hand (3 days worth). Pt voices understanding.

## 2016-05-26 NOTE — Telephone Encounter (Signed)
Tramadol is being prescribed by Dr. Letta Pate, so he should request refill from him.

## 2016-05-26 NOTE — Telephone Encounter (Signed)
I would need to know which type of pain he is complaining of, if it is more spasm related pain, Botox would be more effective, if it is other pain. We may need to increase tramadol to 50mg  3 times per day

## 2016-05-27 NOTE — Telephone Encounter (Signed)
Left message on VM to reurn call to clinic.

## 2016-05-29 ENCOUNTER — Ambulatory Visit (HOSPITAL_BASED_OUTPATIENT_CLINIC_OR_DEPARTMENT_OTHER): Payer: Medicare Other | Admitting: Physical Medicine & Rehabilitation

## 2016-05-29 ENCOUNTER — Encounter: Payer: Medicare Other | Attending: Physical Medicine & Rehabilitation

## 2016-05-29 ENCOUNTER — Telehealth: Payer: Self-pay | Admitting: *Deleted

## 2016-05-29 ENCOUNTER — Telehealth: Payer: Self-pay | Admitting: Physical Medicine & Rehabilitation

## 2016-05-29 ENCOUNTER — Encounter: Payer: Self-pay | Admitting: Physical Medicine & Rehabilitation

## 2016-05-29 VITALS — BP 131/79 | HR 78

## 2016-05-29 DIAGNOSIS — S39012A Strain of muscle, fascia and tendon of lower back, initial encounter: Secondary | ICD-10-CM

## 2016-05-29 DIAGNOSIS — G8110 Spastic hemiplegia affecting unspecified side: Secondary | ICD-10-CM | POA: Diagnosis not present

## 2016-05-29 DIAGNOSIS — I25119 Atherosclerotic heart disease of native coronary artery with unspecified angina pectoris: Secondary | ICD-10-CM | POA: Diagnosis not present

## 2016-05-29 DIAGNOSIS — G8111 Spastic hemiplegia affecting right dominant side: Secondary | ICD-10-CM | POA: Insufficient documentation

## 2016-05-29 MED ORDER — BACLOFEN 10 MG PO TABS: 10.0000 mg | ORAL_TABLET | Freq: Three times a day (TID) | ORAL | 0 refills | Status: DC

## 2016-05-29 NOTE — Progress Notes (Signed)
Subjective:    Patient ID: [name], male    DOB: Jun 11, 1945, 71 y.o.   MRN: [id number]  HPI 71 year old male with past medical history of right CVA causing spastic left hemiplegia. He has been receiving Botox injections for left hamstring spasticity as well as left upper extremity spasticity. His last injection 01/08/2016. Interval medical history increasing low back pain. He is undergone MRI of the lumbar spine demonstrating moderately severe spinal stenosis, multilevel. Underwent neurosurgical evaluation, no surgery was performed. No physical therapy thus far. Has undergone neurologic evaluation, recommendation for physical therapy, walker, note indicates patient was not in favor of surgical intervention. Past history significant for atrial fibrillation on chronic Coumadin. Patient has been using the walker now. Would like a wheeled walker with seat. He is ambulating with this all the time. He occasionally has pain going down the  legs. Is not always with walking.  Pain Inventory Average Pain 6 Pain Right Now 6 My pain is sharp and stabbing  In the last 24 hours, has pain interfered with the following? General activity 8 Relation with others 5 Enjoyment of life 8 What TIME of day is your pain at its worst? morning and night Sleep (in general) Fair  Pain is worse with: walking Pain improves with: medication Relief from Meds: 6  Mobility use a walker transfers alone  Function retired  Neuro/Psych No problems in this area  Prior Studies Any changes since last visit?  no  Physicians involved in your care Any changes since last visit?  no   Family History  Problem Relation Age of Onset  . Lung cancer Father     deceased  . Stroke Mother     deceased-MINISTROKES   Social History   Social History  . Marital status: Married    Spouse name: [name]  . Number of children: N/A  . Years of education: 4   Occupational History  . retired Geophysicist/field seismologist   Social History Main Topics  . Smoking status: Never Smoker  . Smokeless tobacco: Never Used  . Alcohol use No     Comment: 1 beer a month  . Drug use: No  . Sexual activity: No   Other Topics Concern  . Not on file   Social History Narrative   Pt lives with wife. Does have stairs, but patient doesn't use them. Pt has completed technical school   Past Surgical History:  Procedure Laterality Date  . CARDIAC CATHETERIZATION N/A 10/25/2015   Procedure: Left Heart Cath and Cors/Grafts Angiography;  Surgeon: Troy Sine, MD;  Location: [place] [facility];  Service: Cardiovascular;  Laterality: N/A;  . CARDIOVERSION  06/20/2011   Procedure: CARDIOVERSION;  Surgeon: Larey Dresser, MD;  Location: [facility] ENDOSCOPY;  Service: Cardiovascular;  Laterality: N/A;  . CARDIOVERSION N/A 04/26/2015   Procedure: CARDIOVERSION;  Surgeon: Dorothy Spark, MD;  Location: [facility] ENDOSCOPY;  Service: Cardiovascular;  Laterality: N/A;  . CARDIOVERSION N/A 05/10/2015   Procedure: CARDIOVERSION;  Surgeon: Larey Dresser, MD;  Location: [place];  Service: Cardiovascular;  Laterality: N/A;  . CATARACT EXTRACTION  05/2010   left eye  . CATARACT EXTRACTION  04/2010   right eye  . CORONARY ARTERY BYPASS GRAFT     stent  . ESOPHAGOGASTRODUODENOSCOPY  03-25-2005  . NASAL SEPTUM SURGERY    . PERCUTANEOUS PLACEMENT INTRAVASCULAR STENT CERVICAL CAROTID ARTERY     03-2009; using a drug-eluting platform of the circumflex cornoray artery with a  3.0 x 18 [employer] Promus drug-eluting platform post dilated to 3.75 with a noncompliant balloon.  . TEE WITHOUT CARDIOVERSION  06/20/2011   Procedure: TRANSESOPHAGEAL ECHOCARDIOGRAM (TEE);  Surgeon: Larey Dresser, MD;  Location: [place];  Service: Cardiovascular;  Laterality: N/A;   Past Medical History:  Diagnosis Date  . CAD (coronary artery disease)    s/p CABG, s/p DES to LCX  January 2011  . Chronic pain    left sided-Kristeins  . CVA (cerebral  infarction)    left hemiparesis  . Diastolic CHF ([place])   . Dysrhythmia    hx of atrial fibrilation with cardioversion  . GERD (gastroesophageal reflux disease)   . Gout   . Hyperlipidemia   . Hypertension   . Hypothyroidism   . OSA on CPAP   . Sleep apnea   . Stroke ([place]) 1996  . Tubular adenoma of colon 05/2010   There were no vitals taken for this visit.  Opioid Risk Score:   Fall Risk Score:  `1  Depression screen PHQ 2/9  Depression screen [facility] 2/9 05/21/2016 03/05/2015 01/01/2015 06/06/2014 03/24/2014  Decreased Interest 0 0 3 0 0  Down, Depressed, Hopeless 0 0 0 0 0  PHQ - 2 Score 0 0 3 0 0  Altered sleeping 1 - 0 2 -  Tired, decreased energy 1 - 1 0 -  Change in appetite 0 - 0 0 -  Feeling bad or failure about yourself  0 - 0 0 -  Trouble concentrating 0 - 0 0 -  Moving slowly or fidgety/restless 0 - 0 1 -  Suicidal thoughts 0 - 0 0 -  PHQ-9 Score 2 - 4 3 -  Difficult doing work/chores - - Not difficult at all - -  Some recent data might be hidden    Review of Systems  Constitutional: Negative.   HENT: Negative.   Eyes: Negative.   Respiratory: Negative.   Cardiovascular: Negative.   Gastrointestinal: Negative.   Endocrine: Negative.   Genitourinary: Negative.   Musculoskeletal: Negative.   Skin: Negative.   Allergic/Immunologic: Negative.   Neurological: Negative.   Hematological: Negative.   Psychiatric/Behavioral: Negative.   All other systems reviewed and are negative.      Objective:   Physical Exam  Constitutional: He is oriented to person, place, and time. He appears well-developed and well-nourished.  HENT:  Head: Normocephalic and atraumatic.  Eyes: Conjunctivae are normal. Pupils are equal, round, and reactive to light.  Musculoskeletal:       Thoracic back: He exhibits decreased range of motion. He exhibits no tenderness.       Lumbar back: He exhibits decreased range of motion, tenderness and spasm. He exhibits no deformity.  Tenderness  palpation bilateral L3 and L2 paraspinal muscle groups. Bilateral S1 paraspinal muscle groups Left L5, l4  Neurological: He is alert and oriented to person, place, and time. He exhibits abnormal muscle tone. He displays no seizure activity. Gait abnormal.  [place] grade 3 spasticity in the left hamstring as well as the left wrist flexor Motor strength is 4 minus in the left deltoid, biceps, triceps, grip, 4 minus and hip flexor, knee extensor, limited knee extension due to hamstring spasticity.  Psychiatric: He has a normal mood and affect. His behavior is normal. Judgment and thought content normal.  Nursing note and vitals reviewed.         Assessment & Plan:  1. Lumbar spinal stenosis. Reviewed MRI, no clearcut neurogenic claudication, formal postural standpoint  he's been having alterations due to increasing hamstring spasticity. I believe that his related to not receiving Botox injection on schedule. We will schedule for this in 2 weeks. 200 units into the hamstring.  In terms of his low back pain. It is multifactorial. He does have lumbar spondylosis, lumbar degenerative disc but also has developed myofascial pain in the lumbar spine. For that reason, we will perform trigger point injection today  Trigger Point Injection  Indication: Lumbosacral Myofascial pain not relieved by medication management and other conservative care.  Informed consent was obtained after describing risk and benefits of the procedure with the patient, this includes bleeding, bruising, infection and medication side effects.  The patient wishes to proceed and has given written consent.  The patient was placed in a seated position.  The bilateral L2, L3, S1, left L4, left L5 erector spinae area was marked and prepped with Betadine.  It was entered with a 25-gauge 1-1/2 inch needle and 1 mL of 1% lidocaine was injected into each of 8 trigger points, after negative draw back for blood.  The patient tolerated the  procedure well.  Post procedure instructions were given.

## 2016-05-29 NOTE — Patient Instructions (Signed)
Non medication pain relief  Try  heating pad for 20 minutes every 2 hours as needed  Try TENs unit that can be puchased in a pharmacy for about 40.00, brands include Aleve or Icy Hot  Try various muscle cremes  Aspercreme or others that contain trolamine- this is anti inflammatory  Capsaicin creme which reduces Pain substance P  Lidocaine containing creme which has a numbing medicine  Methol and camphor containing creme which has a cooling effect- like ICY HOT or BENGAY

## 2016-05-29 NOTE — Telephone Encounter (Signed)
error 

## 2016-05-29 NOTE — Telephone Encounter (Signed)
Patients wife called to let Dr. Letta Pate know that patient has been falling a lot here lately.  She would like the patient to get a new walker 4 prongs on bottom and a back brace.  Any questions please call wife.

## 2016-05-29 NOTE — Telephone Encounter (Signed)
He is on the schedule this am to see Kirsteins

## 2016-06-02 ENCOUNTER — Telehealth: Payer: Self-pay | Admitting: Family

## 2016-06-02 DIAGNOSIS — L603 Nail dystrophy: Secondary | ICD-10-CM | POA: Diagnosis not present

## 2016-06-02 DIAGNOSIS — L84 Corns and callosities: Secondary | ICD-10-CM | POA: Diagnosis not present

## 2016-06-02 DIAGNOSIS — I739 Peripheral vascular disease, unspecified: Secondary | ICD-10-CM | POA: Diagnosis not present

## 2016-06-02 DIAGNOSIS — I6930 Unspecified sequelae of cerebral infarction: Secondary | ICD-10-CM

## 2016-06-02 NOTE — Telephone Encounter (Signed)
Order printed and signed by PCP and given to pt.

## 2016-06-02 NOTE — Telephone Encounter (Signed)
°  Relation to [place]:OVZC Call back number: [phone] Pharmacy:  Reason for call: pt is needing a rx for a new walker with the seat on it, states [facility] and states he is willing to wait for it.

## 2016-06-16 ENCOUNTER — Encounter: Payer: Medicare Other | Attending: Physical Medicine & Rehabilitation

## 2016-06-16 ENCOUNTER — Encounter: Payer: Self-pay | Admitting: Physical Medicine & Rehabilitation

## 2016-06-16 ENCOUNTER — Ambulatory Visit (HOSPITAL_BASED_OUTPATIENT_CLINIC_OR_DEPARTMENT_OTHER): Payer: Medicare Other | Admitting: Physical Medicine & Rehabilitation

## 2016-06-16 VITALS — BP 143/93 | HR 79 | Resp 14

## 2016-06-16 DIAGNOSIS — G8111 Spastic hemiplegia affecting right dominant side: Secondary | ICD-10-CM | POA: Diagnosis not present

## 2016-06-16 DIAGNOSIS — G8110 Spastic hemiplegia affecting unspecified side: Secondary | ICD-10-CM | POA: Diagnosis not present

## 2016-06-16 NOTE — Patient Instructions (Addendum)
you may want to check your insurance whether you have physical therapy benefits

## 2016-06-16 NOTE — Progress Notes (Signed)
Botox Injection for spasticity using needle EMG guidance  Dilution: 50 Units/ml Indication: Severe spasticity which interferes with ADL,mobility and/or  hygiene and is unresponsive to medication management and other conservative care Informed consent was obtained after describing risks and benefits of the procedure with the patient. This includes bleeding, bruising, infection, excessive weakness, or medication side effects. A REMS form is on file and signed. Needle: 27g 1" needle electrode Number of units per muscle  Biceps75 FCR25  Hamstrings200 All injections were done after obtaining appropriate EMG activity and after negative drawback for blood. The patient tolerated the procedure well. Post procedure instructions were given. A followup appointment was made.

## 2016-06-30 ENCOUNTER — Ambulatory Visit (INDEPENDENT_AMBULATORY_CARE_PROVIDER_SITE_OTHER): Payer: Medicare Other | Admitting: *Deleted

## 2016-06-30 DIAGNOSIS — I4891 Unspecified atrial fibrillation: Secondary | ICD-10-CM

## 2016-06-30 DIAGNOSIS — Z7901 Long term (current) use of anticoagulants: Secondary | ICD-10-CM

## 2016-06-30 DIAGNOSIS — I25119 Atherosclerotic heart disease of native coronary artery with unspecified angina pectoris: Secondary | ICD-10-CM

## 2016-06-30 LAB — POCT INR: INR: 1.9

## 2016-07-16 ENCOUNTER — Ambulatory Visit (INDEPENDENT_AMBULATORY_CARE_PROVIDER_SITE_OTHER): Payer: Medicare Other | Admitting: Family

## 2016-07-16 ENCOUNTER — Encounter: Payer: Self-pay | Admitting: Family

## 2016-07-16 VITALS — BP 145/80 | HR 68 | Temp 98.3°F | Resp 16 | Ht 75.0 in | Wt 252.6 lb

## 2016-07-16 DIAGNOSIS — R2681 Unsteadiness on feet: Secondary | ICD-10-CM | POA: Diagnosis not present

## 2016-07-16 DIAGNOSIS — K5900 Constipation, unspecified: Secondary | ICD-10-CM | POA: Diagnosis not present

## 2016-07-16 DIAGNOSIS — I25119 Atherosclerotic heart disease of native coronary artery with unspecified angina pectoris: Secondary | ICD-10-CM | POA: Diagnosis not present

## 2016-07-16 MED ORDER — LEVOTHYROXINE SODIUM 112 MCG PO TABS: 112.0000 ug | ORAL_TABLET | Freq: Every day | ORAL | 0 refills | Status: DC

## 2016-07-16 MED ORDER — BENAZEPRIL HCL 10 MG PO TABS: 10.0000 mg | ORAL_TABLET | Freq: Every day | ORAL | 1 refills | Status: DC

## 2016-07-16 MED ORDER — LACTULOSE 10 GM/15ML PO SOLN: 10.0000 g | Freq: Every day | ORAL | 1 refills | Status: DC | PRN

## 2016-07-16 NOTE — Progress Notes (Signed)
Pre visit review using our clinic review tool, if applicable. No additional management support is needed unless otherwise documented below in the visit note. 

## 2016-07-16 NOTE — Patient Instructions (Signed)
Add regular exercise such as water aerobics, recumbant bike, walking with your walker.

## 2016-07-16 NOTE — Progress Notes (Signed)
Subjective:    Patient ID: [name], male    DOB: [birth year], 71 y.o.   MRN: [id number]  HPI  [name] is a 71 yr old male who presents today with two concerns:  1) Gait disorder- reports difficulty walking far distances due to poor balance. Feels like his balanced worsened in November.    2) Constipation- reports intermittent constipation for 1-2 months.  Has tried lactulose which helps him. Requests rx.  Lab Results  Component Value Date   HGBA1C 6.4 05/21/2016   HGBA1C 5.8 (H) 10/22/2015   HGBA1C 6.5 06/04/2015   Lab Results  Component Value Date   MICROALBUR <0.7 09/26/2014   [place] 75 10/23/2015   CREATININE 1.08 05/21/2016    Review of Systems    see HPI  Past Medical History:  Diagnosis Date  . CAD (coronary artery disease)    s/p CABG, s/p DES to LCX  January 2011  . Chronic pain    left sided-Kristeins  . CVA (cerebral infarction)    left hemiparesis  . Diastolic CHF ([place])   . Dysrhythmia    hx of atrial fibrilation with cardioversion  . GERD (gastroesophageal reflux disease)   . Gout   . Hyperlipidemia   . Hypertension   . Hypothyroidism   . OSA on CPAP   . Sleep apnea   . Stroke ([place]) 1996  . Tubular adenoma of colon 05/2010     Social History   Social History  . Marital status: Married    Spouse name: [name]  . Number of children: N/A  . Years of education: 26   Occupational History  . retired Hydrographic surveyor   Social History Main Topics  . Smoking status: Never Smoker  . Smokeless tobacco: Never Used  . Alcohol use No     Comment: 1 beer a month  . Drug use: No  . Sexual activity: No   Other Topics Concern  . Not on file   Social History Narrative   Pt lives with wife. Does have stairs, but patient doesn't use them. Pt has completed technical school    Past Surgical History:  Procedure Laterality Date  . CARDIAC CATHETERIZATION N/A 10/25/2015   Procedure: Left Heart Cath and Cors/Grafts Angiography;   Surgeon: Troy Sine, MD;  Location: [place] [facility];  Service: Cardiovascular;  Laterality: N/A;  . CARDIOVERSION  06/20/2011   Procedure: CARDIOVERSION;  Surgeon: Larey Dresser, MD;  [facility] ENDOSCOPY;  Service: Cardiovascular;  Laterality: N/A;  . CARDIOVERSION N/A 04/26/2015   Procedure: CARDIOVERSION;  Surgeon: Dorothy Spark, MD;  Location: [facility] ENDOSCOPY;  Service: Cardiovascular;  Laterality: N/A;  . CARDIOVERSION N/A 05/10/2015   Procedure: CARDIOVERSION;  Surgeon: Larey Dresser, MD;  Location: [place];  Service: Cardiovascular;  Laterality: N/A;  . CATARACT EXTRACTION  05/2010   left eye  . CATARACT EXTRACTION  04/2010   right eye  . CORONARY ARTERY BYPASS GRAFT     stent  . ESOPHAGOGASTRODUODENOSCOPY  03-25-2005  . NASAL SEPTUM SURGERY    . PERCUTANEOUS PLACEMENT INTRAVASCULAR STENT CERVICAL CAROTID ARTERY     03-2009; using a drug-eluting platform of the circumflex cornoray artery with a 3.0 x 18 [employer] Promus drug-eluting platform post dilated to 3.75 with a noncompliant balloon.  . TEE WITHOUT CARDIOVERSION  06/20/2011   Procedure: TRANSESOPHAGEAL ECHOCARDIOGRAM (TEE);  Surgeon: Larey Dresser, MD;  Location: [place];  Service: Cardiovascular;  Laterality: N/A;  Family History  Problem Relation Age of Onset  . Lung cancer Father     deceased  . Stroke Mother     deceased-MINISTROKES    No Known Allergies  Current Outpatient Prescriptions on File Prior to Visit  Medication Sig Dispense Refill  . allopurinol (ZYLOPRIM) 100 MG tablet TAKE 2 TABLETS BY MOUTH DAILY. D/C PREVIOUS SCRIPTS FOR THIS MEDICATION 180 tablet 1  . amiodarone (PACERONE) 100 MG tablet Take 1 tablet (100 mg total) by mouth daily. 90 tablet 3  . atorvastatin (LIPITOR) 40 MG tablet TAKE 2 TABLETS (80 MG TOTAL) BY MOUTH DAILY. 180 tablet 3  . benazepril (LOTENSIN) 10 MG tablet Take 1 tablet (10 mg total) by mouth daily. 90 tablet 0  . fluticasone (FLONASE) 50 MCG/ACT nasal  spray Place 2 sprays into both nostrils daily. 9.9 g 1  . furosemide (LASIX) 40 MG tablet Take 1 tablet (40 mg total) by mouth every other day. 45 tablet 1  . isosorbide mononitrate (IMDUR) 30 MG 24 hr tablet Take 0.5 tablets (15 mg total) by mouth daily. 30 tablet 6  . levothyroxine (SYNTHROID, LEVOTHROID) 112 MCG tablet Take 1 tablet (112 mcg total) by mouth daily before breakfast. 90 tablet 0  . metFORMIN (GLUCOPHAGE) 500 MG tablet Take 1 tablet (500 mg total) by mouth daily with breakfast. 90 tablet 1  . Multiple Vitamins-Minerals (MULTIVITAMIN GUMMIES MENS PO) Take 1 tablet by mouth daily.    . nitroGLYCERIN (NITROSTAT) 0.4 MG SL tablet Place 1 tablet (0.4 mg total) under the tongue every 5 (five) minutes as needed for chest pain. Up to 3 doses 10 tablet 2  . pantoprazole (PROTONIX) 40 MG tablet Take 1 tablet (40 mg total) by mouth daily. 90 tablet 1  . traMADol (ULTRAM) 50 MG tablet Take 1 tablet (50 mg total) by mouth 2 (two) times daily. 180 tablet 1  . traMADol (ULTRAM-ER) 200 MG 24 hr tablet TAKE 1 TABLET EVERY DAY WITH AM DOSE OF TRAMADOL 50 MG 90 tablet 1  . vitamin B-12 1000 MCG tablet Take 1 tablet (1,000 mcg total) by mouth daily. 30 tablet 0  . warfarin (COUMADIN) 2.5 MG tablet Take warfarin 2.5mg  tonight, Saturday and Sunday and recheck INR in clinic Monday (Patient taking differently: Take 2.5 mg by mouth daily. ) 30 tablet 0   No current facility-administered medications on file prior to visit.     BP (!) 145/80 (BP Location: Right Arm, Cuff Size: Large)   Pulse 68   Temp 98.3 F (36.8 C) (Oral)   Resp 16   Ht 6\' 3"  (1.905 m)   Wt 252 lb 9.6 oz (114.6 kg)   SpO2 97% Comment: room air  BMI 31.57 kg/m    Objective:   Physical Exam  Constitutional: He is oriented to person, place, and time. He appears well-developed and well-nourished. No distress.  HENT:  Head: Normocephalic and atraumatic.  Cardiovascular: Normal rate and regular rhythm.   No murmur  heard. Pulmonary/Chest: Effort normal and breath sounds normal. No respiratory distress. He has no wheezes. He has no rales.  Musculoskeletal: He exhibits no edema.  Neurological: He is alert and oriented to person, place, and time.  Unsteady gait- uses rolling walker  Mild LUE spastisticy/weakness noted, mild LLE weakness noted.   Skin: Skin is warm and dry.  Psychiatric: He has a normal mood and affect. His behavior is normal. Thought content normal.          Assessment & Plan:   Unsteady  gait- given hx of CVA and worsening gait- I recommended a follow up head CT to rule out recurrent CVA.  Patient declines further imaging. Recommended PT referral. Patient also declines PT.  He is agreeable to doing some work on his own, we discussed water aerobics, recumbant bike.  Constipation- rx provided for prn lactulose. 15 minutes spent with pt today. >50% of this time was spent counseling patient on gait issues etc.

## 2016-07-28 ENCOUNTER — Encounter: Payer: Medicare Other | Attending: Physical Medicine & Rehabilitation

## 2016-07-28 ENCOUNTER — Ambulatory Visit: Payer: Medicare Other | Admitting: Physical Medicine & Rehabilitation

## 2016-07-28 DIAGNOSIS — G8111 Spastic hemiplegia affecting right dominant side: Secondary | ICD-10-CM | POA: Insufficient documentation

## 2016-08-12 ENCOUNTER — Ambulatory Visit (INDEPENDENT_AMBULATORY_CARE_PROVIDER_SITE_OTHER): Payer: Medicare Other | Admitting: *Deleted

## 2016-08-12 DIAGNOSIS — Z7901 Long term (current) use of anticoagulants: Secondary | ICD-10-CM | POA: Diagnosis not present

## 2016-08-12 DIAGNOSIS — I4891 Unspecified atrial fibrillation: Secondary | ICD-10-CM | POA: Diagnosis not present

## 2016-08-12 DIAGNOSIS — I25119 Atherosclerotic heart disease of native coronary artery with unspecified angina pectoris: Secondary | ICD-10-CM

## 2016-08-12 LAB — POCT INR: INR: 2

## 2016-08-19 ENCOUNTER — Telehealth: Payer: Self-pay | Admitting: Physical Medicine & Rehabilitation

## 2016-08-19 NOTE — Telephone Encounter (Signed)
Patient missed his follow up appointment in May and is calling to set up a Botox appointment.  I am not sure how much he may receive and when can he have another one done.  His last Botox injection was in April.

## 2016-08-20 ENCOUNTER — Other Ambulatory Visit: Payer: Self-pay

## 2016-08-20 DIAGNOSIS — L84 Corns and callosities: Secondary | ICD-10-CM | POA: Diagnosis not present

## 2016-08-20 DIAGNOSIS — L603 Nail dystrophy: Secondary | ICD-10-CM | POA: Diagnosis not present

## 2016-08-20 DIAGNOSIS — I739 Peripheral vascular disease, unspecified: Secondary | ICD-10-CM | POA: Diagnosis not present

## 2016-08-20 DIAGNOSIS — M71572 Other bursitis, not elsewhere classified, left ankle and foot: Secondary | ICD-10-CM | POA: Diagnosis not present

## 2016-08-20 DIAGNOSIS — M2042 Other hammer toe(s) (acquired), left foot: Secondary | ICD-10-CM | POA: Diagnosis not present

## 2016-08-20 DIAGNOSIS — L97529 Non-pressure chronic ulcer of other part of left foot with unspecified severity: Secondary | ICD-10-CM | POA: Diagnosis not present

## 2016-08-20 NOTE — Telephone Encounter (Signed)
300U botox, not before 7/3

## 2016-08-20 NOTE — Telephone Encounter (Signed)
Patient would  like a refill on his medication Tramadol.

## 2016-08-20 NOTE — Telephone Encounter (Signed)
Patient called requesting a refill of tramadol, last note that mentioned medication was on 05-29-16  and stated:  Non medication pain relief  Try  heating pad for 20 minutes every 2 hours as needed  Try TENs unit that can be puchased in a pharmacy for about 40.00, brands include Aleve or Icy Hot  Try various muscle cremes  Last refill of any kind of tramadol was also last prescribed on 10-17,   Please advise

## 2016-08-21 MED ORDER — TRAMADOL HCL 50 MG PO TABS: 50.0000 mg | ORAL_TABLET | Freq: Two times a day (BID) | ORAL | 5 refills | Status: DC

## 2016-08-21 MED ORDER — TRAMADOL HCL ER 200 MG PO TB24: ORAL_TABLET | ORAL | 5 refills | Status: DC

## 2016-08-21 NOTE — Telephone Encounter (Signed)
Medications called to pharmacy and message left alerting [name] of the pharmacy order.

## 2016-08-21 NOTE — Telephone Encounter (Signed)
oK to refill  TRamadol ER 200mg  daily #30 with 5 RF Tramadol 50mg  BID #60 with 5 RF

## 2016-08-25 ENCOUNTER — Encounter: Payer: Self-pay | Admitting: Internal Medicine

## 2016-08-25 ENCOUNTER — Ambulatory Visit (INDEPENDENT_AMBULATORY_CARE_PROVIDER_SITE_OTHER): Payer: Medicare Other | Admitting: Internal Medicine

## 2016-08-25 VITALS — BP 154/88 | HR 65 | Ht 75.0 in | Wt 258.0 lb

## 2016-08-25 DIAGNOSIS — I5032 Chronic diastolic (congestive) heart failure: Secondary | ICD-10-CM | POA: Diagnosis not present

## 2016-08-25 DIAGNOSIS — I480 Paroxysmal atrial fibrillation: Secondary | ICD-10-CM | POA: Diagnosis not present

## 2016-08-25 DIAGNOSIS — I10 Essential (primary) hypertension: Secondary | ICD-10-CM

## 2016-08-25 DIAGNOSIS — E785 Hyperlipidemia, unspecified: Secondary | ICD-10-CM

## 2016-08-25 DIAGNOSIS — I25118 Atherosclerotic heart disease of native coronary artery with other forms of angina pectoris: Secondary | ICD-10-CM | POA: Diagnosis not present

## 2016-08-25 MED ORDER — BENAZEPRIL HCL 10 MG PO TABS: 10.0000 mg | ORAL_TABLET | Freq: Every day | ORAL | 1 refills | Status: DC

## 2016-08-25 NOTE — Patient Instructions (Signed)
Medication Instructions:  Increase benazepril to 15 mg daily. This will be 1 and 1/2 of your 10 mg tablets daily at the same time.  Labwork: CMET/TSH in about 2 weeks  Testing/Procedures: Your physician has recommended that you have a pulmonary function test. Pulmonary Function Tests are a group of tests that measure how well air moves in and out of your lungs.    Follow-Up: Your physician recommends that you schedule a follow-up appointment in: about 1 month for a BP check in the [facility]. This can be the same day as your next coumadin check.   Your physician recommends that you schedule a follow-up appointment in: 3 months with Dr End.        If you need a refill on your cardiac medications before your next appointment, please call your pharmacy.

## 2016-08-25 NOTE — Progress Notes (Signed)
Follow-up Outpatient Visit Date: 08/25/2016  Primary Care Provider: Debbrah Alar, NP [address] [place] [id number]  Chief Complaint: Follow-up coronary artery disease, chronic diastolic heart failure, and paroxysmal atrial fibrillation  HPI:  [name] is a 71 y.o. year-old male with history of coronary artery disease status post CABG, chronic diastolic heart failure, paroxysmal atrial fibrillation, stroke, hypertension, hyperlipidemia, and sleep apnea, who presents for follow-up of CAD and a-fib. He was previously followed in our office by Dr. Aundra Dubin and was last seen by [name] in 04/2016 at which time he was doing well. Today, he reports feeling well. He has not had any chest pain or shortness of breath. He notes decreased strength and coordination in this left hand, though he attributes to missing his Botox injection this month. Otherwise, is chronic left-sided numbness and weakness are stable from his remote stroke. He has not fallen. He remains on warfarin without significant bleeding. He denies palpitations, lightheadedness, orthopnea, PND, and edema. He currently takes Imdur 15 mg daily, as he felt lightheaded with higher doses. He notes that his home blood pressure has been gradually increasing over the last month or so. Home blood pressure yesterday was 138/76.  --------------------------------------------------------------------------------------------------  Cardiovascular History & Procedures: Cardiovascular Problems:  Coronary artery disease  Paroxysmal atrial fibrillation  Chronic diastolic heart failure  Stroke with residual left-sided weakness  Risk Factors:  Known CAD, stroke, hypertension, and hyperlipidemia  Cath/PCI:  LHC (10/25/15): LMCA normal. LAD with 90% ostial stenosis with competitive flow in the midvessel. D1 ostially occluded. LCx with 95% ostial stenosis. 30% mid LCx ISR. Ostially occluded OM2. Small, non-dominant RCA  that is chronically occluded. LIMA->LAD patent with 95% stenosis in the apical LAD distal to the anastomosis. Patent SVG Y-graft to D1 and OM2. SVG->PDA known to be occluded.  CV Surgery:  CABG (LIMA->LAD, SVG Y-graft ->D1 and OM2, and SVG->PDA)  EP Procedures and Devices:  None  Non-Invasive Evaluation(s):  TTE (04/26/15): Normal LV size with mild LVH. LVEF 50-55% with normal wall motion and diastolic function. Mildly thickened aortic valve. Mild left atrial enlargement. Moderate TR. Normal RV size and function.  Recent CV Pertinent Labs: Lab Results  Component Value Date   CHOL 142 10/23/2015   HDL 42 10/23/2015   LDLCALC 75 10/23/2015   TRIG 127 10/23/2015   CHOLHDL 3.4 10/23/2015   INR 2.0 08/12/2016   INR 1.86 12/13/2015   BNP 53.6 12/12/2015   BNP 82.2 11/05/2015   K 4.4 05/21/2016   MG 2.1 10/22/2015   BUN 16 05/21/2016   CREATININE 1.08 05/21/2016   CREATININE 1.27 (H) 11/05/2015    Past medical and surgical history were reviewed and updated in EPIC.  Outpatient Encounter Prescriptions as of 08/25/2016  Medication Sig  . allopurinol (ZYLOPRIM) 100 MG tablet TAKE 2 TABLETS BY MOUTH DAILY. D/C PREVIOUS SCRIPTS FOR THIS MEDICATION  . amiodarone (PACERONE) 100 MG tablet Take 1 tablet (100 mg total) by mouth daily.  Marland Kitchen atorvastatin (LIPITOR) 40 MG tablet TAKE 2 TABLETS (80 MG TOTAL) BY MOUTH DAILY.  . benazepril (LOTENSIN) 10 MG tablet Take 1 tablet (10 mg total) by mouth daily.  . fluticasone (FLONASE) 50 MCG/ACT nasal spray Place 2 sprays into both nostrils daily.  . furosemide (LASIX) 40 MG tablet Take 1 tablet (40 mg total) by mouth every other day.  . isosorbide mononitrate (IMDUR) 30 MG 24 hr tablet Take 0.5 tablets (15 mg total) by mouth daily.  Marland Kitchen lactulose (CHRONULAC) 10  GM/15ML solution Take 15 mLs (10 g total) by mouth daily as needed for mild constipation.  Marland Kitchen levothyroxine (SYNTHROID, LEVOTHROID) 112 MCG tablet Take 1 tablet (112 mcg total) by mouth daily before  breakfast.  . metFORMIN (GLUCOPHAGE) 500 MG tablet Take 1 tablet (500 mg total) by mouth daily with breakfast.  . Multiple Vitamins-Minerals (MULTIVITAMIN GUMMIES MENS PO) Take 1 tablet by mouth daily.  . nitroGLYCERIN (NITROSTAT) 0.4 MG SL tablet Place 1 tablet (0.4 mg total) under the tongue every 5 (five) minutes as needed for chest pain. Up to 3 doses  . pantoprazole (PROTONIX) 40 MG tablet Take 1 tablet (40 mg total) by mouth daily.  . traMADol (ULTRAM) 50 MG tablet Take 1 tablet (50 mg total) by mouth 2 (two) times daily.  . traMADol (ULTRAM-ER) 200 MG 24 hr tablet TAKE 1 TABLET EVERY DAY WITH AM DOSE OF TRAMADOL 50 MG  . vitamin B-12 1000 MCG tablet Take 1 tablet (1,000 mcg total) by mouth daily.  Marland Kitchen warfarin (COUMADIN) 2.5 MG tablet Take warfarin 2.5mg  tonight, Saturday and Sunday and recheck INR in clinic Monday (Patient taking differently: Take 2.5 mg by mouth daily. )   No facility-administered encounter medications on file as of 08/25/2016.     Allergies: Patient has no known allergies.  Social History   Social History  . Marital status: Married    Spouse name: [name]  . Number of children: N/A  . Years of education: 6   Occupational History  . retired Hydrographic surveyor   Social History Main Topics  . Smoking status: Never Smoker  . Smokeless tobacco: Never Used  . Alcohol use No     Comment: 1 beer a month  . Drug use: No  . Sexual activity: No   Other Topics Concern  . Not on file   Social History Narrative   Pt lives with wife. Does have stairs, but patient doesn't use them. Pt has completed technical school    Family History  Problem Relation Age of Onset  . Lung cancer Father        deceased  . Stroke Mother        deceased-[place]ES    Review of Systems: A 12-system review of systems was performed and was negative except as noted in the  HPI.  --------------------------------------------------------------------------------------------------  Physical Exam: BP (!) 154/88   Pulse 65   Ht 6\' 3"  (1.905 m)   Wt 258 lb (117 kg)   BMI 32.25 kg/m   General:  Obese man, seated comfortably in the exam room. HEENT: No conjunctival pallor or scleral icterus.  Moist mucous membranes.  OP clear. Neck: Supple without lymphadenopathy, thyromegaly, JVD, or HJR. Lungs: Normal work of breathing.  Clear to auscultation bilaterally without wheezes or crackles. Heart: Regular rate and rhythm without murmurs, rubs, or gallops.  Non-displaced PMI. Abd: Bowel sounds present.  Soft, NT/ND without hepatosplenomegaly Ext: No lower extremity edema.  Radial, PT, and DP pulses are 2+ bilaterally. Skin: warm and dry without rash  EKG:  Normal sinus rhythm with left axis deviation, LVH, and non-specific T-wave changes. No significant change from prior tracing on 04/21/16.  Lab Results  Component Value Date   WBC 6.6 12/24/2015   HGB 13.4 12/24/2015   HCT 40.5 12/24/2015   MCV 89.8 12/24/2015   PLT 194.0 12/24/2015    Lab Results  Component Value Date   NA 139 05/21/2016   K 4.4 05/21/2016   CL 103 05/21/2016  CO2 30 05/21/2016   BUN 16 05/21/2016   CREATININE 1.08 05/21/2016   GLUCOSE 115 (H) 05/21/2016   ALT 39 12/24/2015    Lab Results  Component Value Date   CHOL 142 10/23/2015   HDL 42 10/23/2015   LDLCALC 75 10/23/2015   TRIG 127 10/23/2015   CHOLHDL 3.4 10/23/2015    --------------------------------------------------------------------------------------------------  ASSESSMENT AND PLAN: Coronary artery disease with stable angina No new symptoms to suggest worsening coronary insufficiency. We will continue his current antianginal regimen, consisting of isosorbide mononitrate. We will also continue with secondary prevention. Most recent LDL in 10/2015 was just above goal at 75. Lifestyle modifications encouraged. No aspirin  therapy at this time due to chronic anticoagulation with warfarin.  Chronic diastolic hear failure Mr. Theiler appears euvolemic on exam today. Left-sided weakness limits functional assessment, though he does not appear decompensated and is able to carry on his normal activities (including yard work) without limitations. Blood pressure is modestly elevated today, which we will address, as detailed below.  Paroxysmal atrial fibrillation EKG today shows NSR. Mr. Kupper denies palpitations or other symptoms to suggest recurrence. We will continue with amiodarone and warfarin. I will check a CMP and TSH when he returns for labs in 1-2 weeks, as well as refer Mr. Arboleda for PFTs to include DLCO.  Hypertension Blood pressure is modestly elevated today; his goal is <130/80 given his history of DM and stroke. We have agreed to increase benazepril to 15 mg daily (he was hesitant to escalate the dose higher at this time). I will have him return in 1-2 weeks for CMP, as well as a blood pressure check in about a month.  Hyperlipidemia Most recent LDL last August was just above our goal at 40. We will continue with atorvastatin 80 mg daily. I encouraged lifestyle modifications.  Follow-up: Return to clinic for BP check in 1 month; office visit with me in 3 months.  Nelva Bush, MD 08/25/2016 8:30 PM

## 2016-08-26 ENCOUNTER — Other Ambulatory Visit: Payer: Self-pay | Admitting: *Deleted

## 2016-08-26 MED ORDER — TRAMADOL HCL ER 200 MG PO TB24: ORAL_TABLET | ORAL | 1 refills | Status: DC

## 2016-09-01 ENCOUNTER — Telehealth: Payer: Self-pay | Admitting: *Deleted

## 2016-09-01 ENCOUNTER — Encounter: Payer: Medicare Other | Attending: Physical Medicine & Rehabilitation

## 2016-09-01 ENCOUNTER — Ambulatory Visit (HOSPITAL_BASED_OUTPATIENT_CLINIC_OR_DEPARTMENT_OTHER): Payer: Medicare Other | Admitting: Physical Medicine & Rehabilitation

## 2016-09-01 ENCOUNTER — Encounter: Payer: Self-pay | Admitting: Physical Medicine & Rehabilitation

## 2016-09-01 VITALS — BP 159/79 | HR 66

## 2016-09-01 DIAGNOSIS — G8111 Spastic hemiplegia affecting right dominant side: Secondary | ICD-10-CM | POA: Diagnosis not present

## 2016-09-01 DIAGNOSIS — I25119 Atherosclerotic heart disease of native coronary artery with unspecified angina pectoris: Secondary | ICD-10-CM | POA: Diagnosis not present

## 2016-09-01 DIAGNOSIS — G8110 Spastic hemiplegia affecting unspecified side: Secondary | ICD-10-CM

## 2016-09-01 NOTE — Progress Notes (Signed)
Subjective:    Patient ID: [name], male    DOB: 04/23/1945, 71 y.o.   MRN: [id number]  HPI  Increased Left arm and left leg pain, No falls or trauma. Continues to take tramadol 200 mg extended release once a day as well as tramadol immediate release 50 g twice a day Last Botox 4/2 Biceps75 FCR25  Hamstrings200  Hx of lumbar stenosis last MRI Sept  Pain Inventory Average Pain 6 Pain Right Now 6 My pain is burning and aching  In the last 24 hours, has pain interfered with the following? General activity 4 Relation with others 4 Enjoyment of life 7 What TIME of day is your pain at its worst? evening Sleep (in general) Fair  Pain is worse with: walking and standing Pain improves with: medication Relief from Meds: 7  Mobility walk without assistance use a walker ability to climb steps?  yes do you drive?  yes  Function retired  Neuro/Psych No problems in this area  Prior Studies Any changes since last visit?  no  Physicians involved in your care Any changes since last visit?  no   Family History  Problem Relation Age of Onset  . Lung cancer Father        deceased  . Stroke Mother        deceased-[place]ROKES   Social History   Social History  . Marital status: Married    Spouse name: [name]  . Number of children: N/A  . Years of education: 76   Occupational History  . retired Hydrographic surveyor   Social History Main Topics  . Smoking status: Never Smoker  . Smokeless tobacco: Never Used  . Alcohol use No     Comment: 1 beer a month  . Drug use: No  . Sexual activity: No   Other Topics Concern  . Not on file   Social History Narrative   Pt lives with wife. Does have stairs, but patient doesn't use them. Pt has completed technical school   Past Surgical History:  Procedure Laterality Date  . CARDIAC CATHETERIZATION N/A 10/25/2015   Procedure: Left Heart Cath and Cors/Grafts Angiography;  Surgeon: Troy Sine, MD;   Location: [facility];  Service: Cardiovascular;  Laterality: N/A;  . CARDIOVERSION  06/20/2011   Procedure: CARDIOVERSION;  Surgeon: Larey Dresser, MD;  Location: [facility] ENDOSCOPY;  Service: Cardiovascular;  Laterality: N/A;  . CARDIOVERSION N/A 04/26/2015   Procedure: CARDIOVERSION;  Surgeon: Dorothy Spark, MD;  Location: [facility] ENDOSCOPY;  Service: Cardiovascular;  Laterality: N/A;  . CARDIOVERSION N/A 05/10/2015   Procedure: CARDIOVERSION;  Surgeon: Larey Dresser, MD;  Location: [facility];  Service: Cardiovascular;  Laterality: N/A;  . CATARACT EXTRACTION  05/2010   left eye  . CATARACT EXTRACTION  04/2010   right eye  . CORONARY ARTERY BYPASS GRAFT     stent  . ESOPHAGOGASTRODUODENOSCOPY  03-25-2005  . NASAL SEPTUM SURGERY    . PERCUTANEOUS PLACEMENT INTRAVASCULAR STENT CERVICAL CAROTID ARTERY     03-2009; using a drug-eluting platform of the circumflex cornoray artery with a 3.0 x 18 [employer] Promus drug-eluting platform post dilated to 3.75 with a noncompliant balloon.  . TEE WITHOUT CARDIOVERSION  06/20/2011   Procedure: TRANSESOPHAGEAL ECHOCARDIOGRAM (TEE);  Surgeon: Larey Dresser, MD;  Location: [facility];  Service: Cardiovascular;  Laterality: N/A;   Past Medical History:  Diagnosis Date  . CAD (coronary artery disease)    s/p CABG,  s/p DES to LCX  January 2011  . Chronic pain    left sided-[place]  . CVA (cerebral infarction)    left hemiparesis  . Diastolic CHF ([place])   . Dysrhythmia    hx of atrial fibrilation with cardioversion  . GERD (gastroesophageal reflux disease)   . Gout   . Hyperlipidemia   . Hypertension   . Hypothyroidism   . OSA on CPAP   . Sleep apnea   . Stroke ([place]) 1996  . Tubular adenoma of colon 05/2010   There were no vitals taken for this visit.  Opioid Risk Score:   Fall Risk Score:  `1  Depression screen PHQ 2/9  Depression screen [facility] 2/9 05/21/2016 03/05/2015 01/01/2015 06/06/2014 03/24/2014  Decreased Interest 0 0 3 0 0    Down, Depressed, Hopeless 0 0 0 0 0  PHQ - 2 Score 0 0 3 0 0  Altered sleeping 1 - 0 2 -  Tired, decreased energy 1 - 1 0 -  Change in appetite 0 - 0 0 -  Feeling bad or failure about yourself  0 - 0 0 -  Trouble concentrating 0 - 0 0 -  Moving slowly or fidgety/restless 0 - 0 1 -  Suicidal thoughts 0 - 0 0 -  PHQ-9 Score 2 - 4 3 -  Difficult doing work/chores - - Not difficult at all - -  Some recent data might be hidden    Review of Systems  Constitutional: Negative.   HENT: Negative.   Eyes: Negative.   Respiratory: Negative.   Cardiovascular: Negative.   Gastrointestinal: Negative.   Endocrine: Negative.   Genitourinary: Negative.   Musculoskeletal: Negative.   Skin: Negative.   Allergic/Immunologic: Negative.   Neurological: Negative.   Hematological: Negative.   Psychiatric/Behavioral: Negative.   All other systems reviewed and are negative.      Objective:   Physical Exam  Constitutional: He appears well-developed and well-nourished.  HENT:  Head: Normocephalic and atraumatic.  Eyes: Conjunctivae and EOM are normal. Pupils are equal, round, and reactive to light.  Nursing note and vitals reviewed.  No pain to palpation in the lumbar spine. There is no spinal deformity. Neck range of motion is reduced to 50% flexion, extension, lateral bending and rotation. Negative foraminal compression test. Has decreased sensation to light touch in the entire left upper and left lower limb. Motor strength is unchanged at 4 minus at the left deltoid by stress of grip, finger flexors and extensors. Lower extremity strength still at 4 minus. Hip flexor, knee extensor and 3 minus, ankle dorsiflexor. Tone is increased to Ashworth grade 2 at the biceps and finger flexors as well as 2 at the left hamstrings.       Assessment & Plan:  1. 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 He has a reduction of approximately 1. in the modified Ashworth scale following Botox injection,  which is the expected effect. He has had no side effects from the medication. His increased pain is likely from the stroke itself. No changes in medications. He did come off the tramadol IR and was recently restarted.  Repeat Botox injection in approximately one month

## 2016-09-01 NOTE — Patient Instructions (Signed)
Repeat Botox next month. No change in medications.

## 2016-09-01 NOTE — Telephone Encounter (Signed)
Called to [facility] and Mr Mcpartlin notified.

## 2016-09-02 ENCOUNTER — Other Ambulatory Visit: Payer: Self-pay | Admitting: *Deleted

## 2016-09-02 ENCOUNTER — Telehealth: Payer: Self-pay | Admitting: *Deleted

## 2016-09-02 MED ORDER — TRAMADOL HCL 50 MG PO TABS: 50.0000 mg | ORAL_TABLET | Freq: Two times a day (BID) | ORAL | 1 refills | Status: DC

## 2016-09-02 MED ORDER — WARFARIN SODIUM 2.5 MG PO TABS: ORAL_TABLET | ORAL | 0 refills | Status: DC

## 2016-09-02 NOTE — Telephone Encounter (Signed)
Patient is requesting #[phone].

## 2016-09-02 NOTE — Telephone Encounter (Signed)
Mr Ramiro needs his immediate release Tramadol called in.  We called the ER in yesterday.  I have done this and let his wife know.

## 2016-09-08 ENCOUNTER — Other Ambulatory Visit: Payer: Medicare Other

## 2016-09-18 ENCOUNTER — Ambulatory Visit (INDEPENDENT_AMBULATORY_CARE_PROVIDER_SITE_OTHER): Payer: Medicare Other | Admitting: Internal Medicine

## 2016-09-18 ENCOUNTER — Other Ambulatory Visit: Payer: Self-pay | Admitting: Internal Medicine

## 2016-09-18 ENCOUNTER — Encounter: Payer: Self-pay | Admitting: Internal Medicine

## 2016-09-18 VITALS — BP 144/78 | HR 76 | Ht 75.0 in | Wt 260.0 lb

## 2016-09-18 DIAGNOSIS — I25118 Atherosclerotic heart disease of native coronary artery with other forms of angina pectoris: Secondary | ICD-10-CM | POA: Diagnosis not present

## 2016-09-18 DIAGNOSIS — I10 Essential (primary) hypertension: Secondary | ICD-10-CM | POA: Diagnosis not present

## 2016-09-18 DIAGNOSIS — I5032 Chronic diastolic (congestive) heart failure: Secondary | ICD-10-CM | POA: Diagnosis not present

## 2016-09-18 DIAGNOSIS — I480 Paroxysmal atrial fibrillation: Secondary | ICD-10-CM

## 2016-09-18 MED ORDER — BENAZEPRIL HCL 5 MG PO TABS: 5.0000 mg | ORAL_TABLET | Freq: Every day | ORAL | 3 refills | Status: DC

## 2016-09-18 NOTE — Progress Notes (Signed)
Follow-up Outpatient Visit Date: 09/18/2016  Primary Care Provider: Debbrah Alar, NP [address] [place] [id number]  Chief Complaint: Follow-up blood pressure  HPI:  Mr. Pitkin is a 71 y.o. year-old male with history of coronary artery disease status post CABG, chronic diastolic heart failure, paroxysmal atrial fibrillation, stroke, hypertension, hyperlipidemia, and obstructive sleep apnea, who presents for follow-up of coronary artery disease and atrial fibrillation. I last saw him on 08/25/16. At that time, he was feeling relatively well though his blood pressure was noted to be moderately elevated. We agreed to increase benazepril to 15 mg daily, as Mr. Costanzo was hesitant to increase it further. Follow-up labs in 1 to 2 weeks were ordered, though it does not appear that the patient ever had these drawn.  Today, Mr. Soulier feels relatively well. He continues to have discomfort of the left arm, which is chronic following a stroke. He does not have any chest pain or shortness of breath, as well as edema or new neurologic deficits. He was a little easy about increasing benazepril, as we discussed at her last visit. However, he has been tolerating this well without lightheadedness/dizziness. He notes that his home blood pressures typically around 130 to 496 mmHg systolic. He has not noticed any adverse effects from dose escalation.  --------------------------------------------------------------------------------------------------  Cardiovascular History & Procedures: Cardiovascular Problems:  Coronary artery disease  Paroxysmal atrial fibrillation  Chronic diastolic heart failure  Stroke with residual left-sided weakness  Risk Factors:  Known CAD, stroke, hypertension, and hyperlipidemia  Cath/PCI:  LHC (10/25/15): LMCA normal. LAD with 90% ostial stenosis with competitive flow in the midvessel. D1 ostially occluded. LCx with 95% ostial stenosis. 30% mid  LCx ISR. Ostially occluded OM2. Small, non-dominant RCA that is chronically occluded. LIMA->LAD patent with 95% stenosis in the apical LAD distal to the anastomosis. Patent SVG Y-graft to D1 and OM2. SVG->PDA known to be occluded.  CV Surgery:  CABG (LIMA->LAD, SVG Y-graft ->D1 and OM2, and SVG->PDA)  EP Procedures and Devices:  None  Non-Invasive Evaluation(s):  TTE (04/26/15): Normal LV size with mild LVH. LVEF 50-55% with normal wall motion and diastolic function. Mildly thickened aortic valve. Mild left atrial enlargement. Moderate TR. Normal RV size and function.  Recent CV Pertinent Labs: Lab Results  Component Value Date   CHOL 142 10/23/2015   HDL 42 10/23/2015   LDLCALC 75 10/23/2015   TRIG 127 10/23/2015   CHOLHDL 3.4 10/23/2015   INR 2.0 08/12/2016   INR 1.86 12/13/2015   BNP 53.6 12/12/2015   BNP 82.2 11/05/2015   K 4.4 05/21/2016   MG 2.1 10/22/2015   BUN 16 05/21/2016   CREATININE 1.08 05/21/2016   CREATININE 1.27 (H) 11/05/2015    Past medical and surgical history were reviewed and updated in EPIC.  Current Meds  Medication Sig  . allopurinol (ZYLOPRIM) 100 MG tablet TAKE 2 TABLETS BY MOUTH DAILY. D/C PREVIOUS SCRIPTS FOR THIS MEDICATION  . amiodarone (PACERONE) 100 MG tablet Take 1 tablet (100 mg total) by mouth daily.  Marland Kitchen atorvastatin (LIPITOR) 40 MG tablet TAKE 2 TABLETS (80 MG TOTAL) BY MOUTH DAILY.  . benazepril (LOTENSIN) 10 MG tablet Take 1 tablet (10 mg total) by mouth daily.  . fluticasone (FLONASE) 50 MCG/ACT nasal spray Place 2 sprays into both nostrils daily.  . furosemide (LASIX) 40 MG tablet Take 1 tablet (40 mg total) by mouth every other day.  . isosorbide mononitrate (IMDUR) 30 MG 24 hr tablet Take 0.5  tablets (15 mg total) by mouth daily.  Marland Kitchen lactulose (CHRONULAC) 10 GM/15ML solution Take 15 mLs (10 g total) by mouth daily as needed for mild constipation.  Marland Kitchen levothyroxine (SYNTHROID, LEVOTHROID) 112 MCG tablet Take 1 tablet (112 mcg total)  by mouth daily before breakfast.  . metFORMIN (GLUCOPHAGE) 500 MG tablet Take 1 tablet (500 mg total) by mouth daily with breakfast.  . Multiple Vitamins-Minerals (MULTIVITAMIN GUMMIES MENS PO) Take 1 tablet by mouth daily.  . nitroGLYCERIN (NITROSTAT) 0.4 MG SL tablet Place 1 tablet (0.4 mg total) under the tongue every 5 (five) minutes as needed for chest pain. Up to 3 doses  . pantoprazole (PROTONIX) 40 MG tablet Take 1 tablet (40 mg total) by mouth daily.  . traMADol (ULTRAM) 50 MG tablet Take 1 tablet (50 mg total) by mouth 2 (two) times daily.  . traMADol (ULTRAM-ER) 200 MG 24 hr tablet TAKE 1 TABLET EVERY DAY WITH AM DOSE OF TRAMADOL 50 MG  . vitamin B-12 1000 MCG tablet Take 1 tablet (1,000 mcg total) by mouth daily.  Marland Kitchen warfarin (COUMADIN) 2.5 MG tablet Take 1/2 tablet daily except 1 tablet on Monday, Wednesday, and Friday or as directed by [facility]    Allergies: Patient has no known allergies.  Social History   Social History  . Marital status: Married    Spouse name: [name]  . Number of children: N/A  . Years of education: 87   Occupational History  . retired Hydrographic surveyor   Social History Main Topics  . Smoking status: Never Smoker  . Smokeless tobacco: Never Used  . Alcohol use No     Comment: 1 beer a month  . Drug use: No  . Sexual activity: No   Other Topics Concern  . Not on file   Social History Narrative   Pt lives with wife. Does have stairs, but patient doesn't use them. Pt has completed technical school    Family History  Problem Relation Age of Onset  . Lung cancer Father        deceased  . Stroke Mother        deceased-[place]ROKES    Review of Systems: A 12-system review of systems was performed and was negative except as noted in the HPI.  --------------------------------------------------------------------------------------------------  Physical Exam: BP (!) 144/78   Pulse 76   Ht 6\' 3"  (1.905 m)   Wt 260 lb (117.9  kg)   BMI 32.50 kg/m   General:  Obese man, seated comfortably in the exam room. He ambulates with a rolling walker. HEENT: No conjunctival pallor or scleral icterus. Moist mucous membranes.  OP clear. Neck: Supple without lymphadenopathy, thyromegaly, JVD, or HJR. Lungs: Normal work of breathing. Clear to auscultation bilaterally without wheezes or crackles. Heart: Regular rate and rhythm without murmurs, rubs, or gallops. Non-displaced PMI. Abd: Bowel sounds present. Soft, NT/ND. Unable to assess HSM due to body habitus. Ext: No lower extremity edema. Radial, PT, and DP pulses are 2+ bilaterally. Skin: Warm and dry without rash.  Lab Results  Component Value Date   WBC 6.6 12/24/2015   HGB 13.4 12/24/2015   HCT 40.5 12/24/2015   MCV 89.8 12/24/2015   PLT 194.0 12/24/2015    Lab Results  Component Value Date   NA 139 05/21/2016   K 4.4 05/21/2016   CL 103 05/21/2016   CO2 30 05/21/2016   BUN 16 05/21/2016   CREATININE 1.08 05/21/2016   GLUCOSE 115 (H) 05/21/2016  ALT 39 12/24/2015    Lab Results  Component Value Date   CHOL 142 10/23/2015   HDL 42 10/23/2015   LDLCALC 75 10/23/2015   TRIG 127 10/23/2015   CHOLHDL 3.4 10/23/2015    --------------------------------------------------------------------------------------------------  ASSESSMENT AND PLAN: Coronary artery disease with stable angina Symptoms are stable without any chest pain. We will continue current medication regimen.  Chronic diastolic heart failure Mr. Brandis appears euvolemic and well compensated today, though assessment is limited by his limited functional capacity owing to prior stroke. Continue his current medications.  Essential hypertension Blood pressure is mildly elevated today. I recommended increasing benazepril to 20 mg daily, the patient declined to do this. We will therefore continue with his current regimen. I will check a complete metabolic panel today to reassess his renal function  and potassium.  Paroxysmal atrial fibrillation Heart sounds are regular today. The patient has not had any symptoms to suggest recurrence of atrial fibrillation. He seems to be tolerating amiodarone well. We will check a CMP and TSH today, as he did not return for lab work after her last visit. PFTs were previously ordered and are pending. Continue indefinite anticoagulation with warfarin.  Follow-up: Return to clinic as previously scheduled in 11/2016.  Nelva Bush, MD 09/18/2016 11:32 AM

## 2016-09-18 NOTE — Patient Instructions (Addendum)
Medication Instructions:  AN RX FOR BENAZEPRIL 5 MG TABLET HAS BEEN SENT IN; YOUR DIRECTIONS WILL BE TO TAKE BENAZEPRIL 5 MG TABLET DAILY ALONG WITH BENAZEPRIL 10 MG TABLET DAILY = 15 MG DAILY  Labwork: TODAY CMP, TSH  Testing/Procedures: NONE ORDERED  Follow-Up: KEEP UPCOMING APPT WITH DR. END  Any Other Special Instructions Will Be Listed Below (If Applicable).     If you need a refill on your cardiac medications before your next appointment, please call your pharmacy.

## 2016-09-19 LAB — COMPREHENSIVE METABOLIC PANEL
ALT: 26 IU/L (ref 0–44)
AST: 24 IU/L (ref 0–40)
Albumin/Globulin Ratio: 1.6 (ref 1.2–2.2)
Albumin: 4.1 g/dL (ref 3.5–4.8)
Alkaline Phosphatase: 75 IU/L (ref 39–117)
BUN/Creatinine Ratio: 11 (ref 10–24)
BUN: 14 mg/dL (ref 8–27)
Bilirubin Total: 0.4 mg/dL (ref 0.0–1.2)
CALCIUM: 9.2 mg/dL (ref 8.6–10.2)
CO2: 24 mmol/L (ref 20–29)
CREATININE: 1.22 mg/dL (ref 0.76–1.27)
Chloride: 102 mmol/L (ref 96–106)
GFR calc Af Amer: 68 mL/min/{1.73_m2} (ref >59)
GFR, EST NON AFRICAN AMERICAN: 58 mL/min/{1.73_m2} — AB (ref >59)
GLUCOSE: 113 mg/dL — AB (ref 65–99)
Globulin, Total: 2.5 g/dL (ref 1.5–4.5)
Potassium: 4.1 mmol/L (ref 3.5–5.2)
Sodium: 143 mmol/L (ref 134–144)
TOTAL PROTEIN: 6.6 g/dL (ref 6.0–8.5)

## 2016-09-19 LAB — TSH: TSH: 2.61 u[IU]/mL (ref 0.450–4.500)

## 2016-09-22 ENCOUNTER — Ambulatory Visit: Payer: Medicare Other | Admitting: Physical Medicine & Rehabilitation

## 2016-09-23 ENCOUNTER — Ambulatory Visit: Payer: Medicare Other | Admitting: Internal Medicine

## 2016-09-23 ENCOUNTER — Encounter: Payer: Self-pay | Admitting: Internal Medicine

## 2016-09-23 ENCOUNTER — Ambulatory Visit (INDEPENDENT_AMBULATORY_CARE_PROVIDER_SITE_OTHER): Payer: Medicare Other | Admitting: Pharmacist

## 2016-09-23 VITALS — BP 124/86 | HR 82

## 2016-09-23 DIAGNOSIS — Z7901 Long term (current) use of anticoagulants: Secondary | ICD-10-CM | POA: Diagnosis not present

## 2016-09-23 DIAGNOSIS — I25118 Atherosclerotic heart disease of native coronary artery with other forms of angina pectoris: Secondary | ICD-10-CM

## 2016-09-23 DIAGNOSIS — I480 Paroxysmal atrial fibrillation: Secondary | ICD-10-CM

## 2016-09-23 DIAGNOSIS — I5032 Chronic diastolic (congestive) heart failure: Secondary | ICD-10-CM

## 2016-09-23 DIAGNOSIS — I10 Essential (primary) hypertension: Secondary | ICD-10-CM

## 2016-09-23 DIAGNOSIS — I25119 Atherosclerotic heart disease of native coronary artery with unspecified angina pectoris: Secondary | ICD-10-CM

## 2016-09-23 LAB — PULMONARY FUNCTION TEST
DL/VA % PRED: 106 %
DL/VA: 5.15 ml/min/mmHg/L
DLCO COR % PRED: 59 %
DLCO COR: 23.19 ml/min/mmHg
DLCO UNC % PRED: 60 %
DLCO unc: 23.76 ml/min/mmHg
FEF 25-75 POST: 1.66 L/s
FEF 25-75 PRE: 1.83 L/s
FEF2575-%CHANGE-POST: -9 %
FEF2575-%PRED-POST: 59 %
FEF2575-%Pred-Pre: 65 %
FEV1-%Change-Post: -2 %
FEV1-%PRED-POST: 47 %
FEV1-%Pred-Pre: 48 %
FEV1-Post: 1.78 L
FEV1-Pre: 1.84 L
FEV1FVC-%CHANGE-POST: 4 %
FEV1FVC-%PRED-PRE: 109 %
FEV6-%CHANGE-POST: -6 %
FEV6-%Pred-Post: 43 %
FEV6-%Pred-Pre: 46 %
FEV6-PRE: 2.29 L
FEV6-Post: 2.14 L
FEV6FVC-%Change-Post: 0 %
FEV6FVC-%PRED-PRE: 105 %
FEV6FVC-%Pred-Post: 105 %
FVC-%Change-Post: -6 %
FVC-%Pred-Post: 41 %
FVC-%Pred-Pre: 44 %
FVC-Post: 2.14 L
FVC-Pre: 2.29 L
POST FEV1/FVC RATIO: 83 %
Post FEV6/FVC ratio: 100 %
Pre FEV1/FVC ratio: 80 %
Pre FEV6/FVC Ratio: 100 %
RV % PRED: 70 %
RV: 1.97 L
TLC % pred: 53 %
TLC: 4.32 L

## 2016-09-23 LAB — POCT INR: INR: 2.2

## 2016-09-23 NOTE — Patient Instructions (Addendum)
It was nice to see you today. Your blood pressure looked great.   Goal blood pressure is less than 130/80  Continue to take your medications as you have been  Start using your recumbent bike at least 3 days a week  Follow up with Dr End in 2 months as scheduled

## 2016-09-23 NOTE — Progress Notes (Signed)
PFT done today. 

## 2016-09-23 NOTE — Progress Notes (Signed)
Patient ID: [name]                 DOB: 1945-09-19                      MRN: [id number]     HPI: [name] is a 71 y.o. male referred by Dr. Saunders Revel to HTN clinic. PMH is significant for CAD s/p CABG, chronic diastolic HF, PAF, stroke, HTN, HLD, and OSA. At last visit, BP was elevated at 144/78 and Dr End recommended increasing benazepril to 20mg , however pt declined to do this. BMET stable on benazepril 15mg  daily. He presents today for follow up.  Pt ambulates to clinic with a walker. He reports feeling well overall and denies dizziness, blurred vision, headache, or falls. He reports adherence with his medications. He occasionally checks his BP at home and can recall 1 reading of 143/83. He took his medications this morning ~2 hours ago and has not had any caffeine.  Current HTN meds: benazepril 15mg  daily, furosemide 40mg  every other day, Imdur 15mg  daily BP goal: <130/59mmHg  Family History: Mother with history of stroke.  Social History: Denies tobacco, alcohol, and illicit drug use.  Diet: Rye bread with tuna fish, chicken salad, peaches. Has cut back on pizza. 1 cup of decaf coffee per day.  Exercise: Has a recumbent bike but it needs a new battery. Tries to walk regularly.  Home BP readings: Can recall one reading of 143/83  Wt Readings from Last 3 Encounters:  09/18/16 260 lb (117.9 kg)  08/25/16 258 lb (117 kg)  07/16/16 252 lb 9.6 oz (114.6 kg)   BP Readings from Last 3 Encounters:  09/18/16 (!) 144/78  09/01/16 (!) 159/79  08/25/16 (!) 154/88   Pulse Readings from Last 3 Encounters:  09/18/16 76  09/01/16 66  08/25/16 65    Renal function: Estimated Creatinine Clearance: 74.7 mL/min (by C-G formula based on SCr of 1.22 mg/dL).  Past Medical History:  Diagnosis Date  . CAD (coronary artery disease)    s/p CABG, s/p DES to LCX  January 2011  . Chronic pain    left sided-[name]  . CVA (cerebral infarction)    left hemiparesis  . Diastolic CHF  ([place])   . Dysrhythmia    hx of atrial fibrilation with cardioversion  . GERD (gastroesophageal reflux disease)   . Gout   . Hyperlipidemia   . Hypertension   . Hypothyroidism   . OSA on CPAP   . Sleep apnea   . Stroke ([place]) 1996  . Tubular adenoma of colon 05/2010    Current Outpatient Prescriptions on File Prior to Visit  Medication Sig Dispense Refill  . allopurinol (ZYLOPRIM) 100 MG tablet TAKE 2 TABLETS BY MOUTH DAILY. D/C PREVIOUS SCRIPTS FOR THIS MEDICATION 180 tablet 1  . amiodarone (PACERONE) 100 MG tablet Take 1 tablet (100 mg total) by mouth daily. 90 tablet 3  . atorvastatin (LIPITOR) 40 MG tablet TAKE 2 TABLETS (80 MG TOTAL) BY MOUTH DAILY. 180 tablet 3  . benazepril (LOTENSIN) 10 MG tablet Take 1 tablet (10 mg total) by mouth daily. 135 tablet 1  . benazepril (LOTENSIN) 5 MG tablet Take 1 tablet (5 mg total) by mouth daily. Take 1 tablet daily with the 10 mg tablet of Benazepril = 15 mg daily 90 tablet 3  . fluticasone (FLONASE) 50 MCG/ACT nasal spray Place 2 sprays into both nostrils daily. 9.9 g 1  . furosemide (LASIX) 40  MG tablet Take 1 tablet (40 mg total) by mouth every other day. 45 tablet 1  . isosorbide mononitrate (IMDUR) 30 MG 24 hr tablet Take 0.5 tablets (15 mg total) by mouth daily. 30 tablet 6  . lactulose (CHRONULAC) 10 GM/15ML solution Take 15 mLs (10 g total) by mouth daily as needed for mild constipation. 240 mL 1  . levothyroxine (SYNTHROID, LEVOTHROID) 112 MCG tablet Take 1 tablet (112 mcg total) by mouth daily before breakfast. 90 tablet 0  . metFORMIN (GLUCOPHAGE) 500 MG tablet Take 1 tablet (500 mg total) by mouth daily with breakfast. 90 tablet 1  . Multiple Vitamins-Minerals (MULTIVITAMIN GUMMIES MENS PO) Take 1 tablet by mouth daily.    . nitroGLYCERIN (NITROSTAT) 0.4 MG SL tablet Place 1 tablet (0.4 mg total) under the tongue every 5 (five) minutes as needed for chest pain. Up to 3 doses 10 tablet 2  . pantoprazole (PROTONIX) 40 MG tablet Take 1  tablet (40 mg total) by mouth daily. 90 tablet 1  . traMADol (ULTRAM) 50 MG tablet Take 1 tablet (50 mg total) by mouth 2 (two) times daily. 60 tablet 1  . traMADol (ULTRAM-ER) 200 MG 24 hr tablet TAKE 1 TABLET EVERY DAY WITH AM DOSE OF TRAMADOL 50 MG 90 tablet 1  . vitamin B-12 1000 MCG tablet Take 1 tablet (1,000 mcg total) by mouth daily. 30 tablet 0  . warfarin (COUMADIN) 2.5 MG tablet Take 1/2 tablet daily except 1 tablet on Monday, Wednesday, and Friday or as directed by [facility]madin [facility] 70 tablet 0   No current facility-administered medications on file prior to visit.     No Known Allergies   Assessment/Plan:  1. Hypertension - BP at goal <130/9mmHg on benazepril 15mg  daily, furosemide 40mg  every other day, and Imdur 15mg  daily. Will continue current medications. Diastolic BP slightly above goal in his right arm but was at goal in his left arm. Encouraged pt to start using his recumbent bike at home at least 3 days per week. Pt will keep f/u with Dr End in 2 months and can f/u in HTN clinic as needed.   Laryah Neuser E. Joshlynn Alfonzo, PharmD, CPP, [facility] [address] [place], [place] [address] Phone: [phone]; Fax: [phone] 09/23/2016 10:38 AM

## 2016-09-24 ENCOUNTER — Telehealth: Payer: Self-pay | Admitting: Internal Medicine

## 2016-09-24 NOTE — Telephone Encounter (Signed)
Spoke with patient about recent lab results 

## 2016-09-24 NOTE — Telephone Encounter (Signed)
New message ° ° ° ° °Returning a call to the nurse to get lab results °

## 2016-09-29 ENCOUNTER — Ambulatory Visit (HOSPITAL_BASED_OUTPATIENT_CLINIC_OR_DEPARTMENT_OTHER): Payer: Medicare Other | Admitting: Physical Medicine & Rehabilitation

## 2016-09-29 ENCOUNTER — Encounter: Payer: Medicare Other | Attending: Physical Medicine & Rehabilitation

## 2016-09-29 ENCOUNTER — Telehealth: Payer: Self-pay | Admitting: *Deleted

## 2016-09-29 ENCOUNTER — Encounter: Payer: Self-pay | Admitting: Physical Medicine & Rehabilitation

## 2016-09-29 VITALS — BP 137/77 | HR 82

## 2016-09-29 DIAGNOSIS — G8110 Spastic hemiplegia affecting unspecified side: Secondary | ICD-10-CM | POA: Diagnosis not present

## 2016-09-29 DIAGNOSIS — G8111 Spastic hemiplegia affecting right dominant side: Secondary | ICD-10-CM | POA: Insufficient documentation

## 2016-09-29 DIAGNOSIS — R942 Abnormal results of pulmonary function studies: Secondary | ICD-10-CM

## 2016-09-29 MED ORDER — TRAMADOL HCL 50 MG PO TABS: 50.0000 mg | ORAL_TABLET | Freq: Two times a day (BID) | ORAL | 0 refills | Status: DC

## 2016-09-29 MED ORDER — TRAMADOL HCL 50 MG PO TABS: 50.0000 mg | ORAL_TABLET | Freq: Two times a day (BID) | ORAL | 1 refills | Status: DC

## 2016-09-29 MED ORDER — TRAMADOL HCL ER 200 MG PO TB24: ORAL_TABLET | ORAL | 1 refills | Status: DC

## 2016-09-29 NOTE — Addendum Note (Signed)
Addended by: Geryl Rankins D on: 09/29/2016 04:29 PM   Modules accepted: Orders

## 2016-09-29 NOTE — Patient Instructions (Signed)

## 2016-09-29 NOTE — Progress Notes (Signed)
Botox Injection for spasticity using needle EMG guidance  Dilution: 50 Units/ml Indication: Severe spasticity which interferes with ADL,mobility and/or  hygiene and is unresponsive to medication management and other conservative care Informed consent was obtained after describing risks and benefits of the procedure with the patient. This includes bleeding, bruising, infection, excessive weakness, or medication side effects. A REMS form is on file and signed. Needle: 27g 1" needle electrode Number of units per muscle  Biceps75 FCR25  Hamstrings200 All injections were done after obtaining appropriate EMG activity and after negative drawback for blood. The patient tolerated the procedure well. Post procedure instructions were given. A followup appointment was made.

## 2016-09-29 NOTE — Telephone Encounter (Signed)
Notes recorded by Nelva Bush, MD on 09/27/2016 at 10:24 PM EDT Please let [name] know that his lung function tests are abnormal and show both diminished air movement as well as possible scarring of the lungs. Given his long-term amiodarone use, I suggest that he see pulmonary to discuss these findings further and to ensure that it is reasonable for him to remain on amiodarone from a lung standpoint. Thanks.

## 2016-09-30 NOTE — Telephone Encounter (Signed)
error 

## 2016-10-09 ENCOUNTER — Other Ambulatory Visit: Payer: Self-pay

## 2016-10-09 MED ORDER — PANTOPRAZOLE SODIUM 40 MG PO TBEC: 40.0000 mg | DELAYED_RELEASE_TABLET | Freq: Every day | ORAL | 0 refills | Status: DC

## 2016-10-16 ENCOUNTER — Emergency Department (HOSPITAL_COMMUNITY): Payer: Medicare Other

## 2016-10-16 ENCOUNTER — Emergency Department (HOSPITAL_COMMUNITY)
Admission: EM | Admit: 2016-10-16 | Discharge: 2016-10-16 | Disposition: A | Discharge status: Home / Self Care | Payer: Medicare Other | Attending: Emergency Medicine | Admitting: Emergency Medicine

## 2016-10-16 ENCOUNTER — Encounter (HOSPITAL_COMMUNITY): Payer: Self-pay

## 2016-10-16 DIAGNOSIS — I5032 Chronic diastolic (congestive) heart failure: Secondary | ICD-10-CM | POA: Diagnosis not present

## 2016-10-16 DIAGNOSIS — I480 Paroxysmal atrial fibrillation: Secondary | ICD-10-CM | POA: Diagnosis not present

## 2016-10-16 DIAGNOSIS — Z79899 Other long term (current) drug therapy: Secondary | ICD-10-CM | POA: Insufficient documentation

## 2016-10-16 DIAGNOSIS — R0609 Other forms of dyspnea: Secondary | ICD-10-CM

## 2016-10-16 DIAGNOSIS — I110 Hypertensive heart disease with heart failure: Secondary | ICD-10-CM | POA: Diagnosis not present

## 2016-10-16 DIAGNOSIS — R0602 Shortness of breath: Secondary | ICD-10-CM

## 2016-10-16 DIAGNOSIS — E119 Type 2 diabetes mellitus without complications: Secondary | ICD-10-CM | POA: Insufficient documentation

## 2016-10-16 DIAGNOSIS — Z9189 Other specified personal risk factors, not elsewhere classified: Secondary | ICD-10-CM

## 2016-10-16 DIAGNOSIS — R942 Abnormal results of pulmonary function studies: Secondary | ICD-10-CM

## 2016-10-16 DIAGNOSIS — Z7984 Long term (current) use of oral hypoglycemic drugs: Secondary | ICD-10-CM | POA: Insufficient documentation

## 2016-10-16 DIAGNOSIS — I2510 Atherosclerotic heart disease of native coronary artery without angina pectoris: Secondary | ICD-10-CM | POA: Diagnosis not present

## 2016-10-16 DIAGNOSIS — E039 Hypothyroidism, unspecified: Secondary | ICD-10-CM | POA: Diagnosis not present

## 2016-10-16 LAB — CBC
HEMATOCRIT: 42.3 % (ref 39.0–52.0)
HEMOGLOBIN: 13.5 g/dL (ref 13.0–17.0)
MCH: 28.8 pg (ref 26.0–34.0)
MCHC: 31.9 g/dL (ref 30.0–36.0)
MCV: 90.2 fL (ref 78.0–100.0)
Platelets: 180 10*3/uL (ref 150–400)
RBC: 4.69 MIL/uL (ref 4.22–5.81)
RDW: 14.1 % (ref 11.5–15.5)
WBC: 5.2 10*3/uL (ref 4.0–10.5)

## 2016-10-16 LAB — BASIC METABOLIC PANEL
ANION GAP: 10 (ref 5–15)
BUN: 17 mg/dL (ref 6–20)
CALCIUM: 9.5 mg/dL (ref 8.9–10.3)
CO2: 27 mmol/L (ref 22–32)
Chloride: 103 mmol/L (ref 101–111)
Creatinine, Ser: 1.14 mg/dL (ref 0.61–1.24)
GFR calc Af Amer: >60 mL/min (ref >60)
GLUCOSE: 134 mg/dL — AB (ref 65–99)
POTASSIUM: 4.5 mmol/L (ref 3.5–5.1)
Sodium: 140 mmol/L (ref 135–145)

## 2016-10-16 LAB — I-STAT TROPONIN, ED: Troponin i, poc: 0 ng/mL (ref 0.00–0.08)

## 2016-10-16 LAB — BRAIN NATRIURETIC PEPTIDE: B Natriuretic Peptide: 88.7 pg/mL (ref 0.0–100.0)

## 2016-10-16 LAB — PROTIME-INR
INR: 2.52
Prothrombin Time: 27.6 seconds — ABNORMAL HIGH (ref 11.4–15.2)

## 2016-10-16 MED ORDER — ALBUTEROL SULFATE HFA 108 (90 BASE) MCG/ACT IN AERS
1.0000 | INHALATION_SPRAY | RESPIRATORY_TRACT | Status: DC | PRN
  Administered 2016-10-16: 2 via RESPIRATORY_TRACT
  Filled 2016-10-16: qty 6.7

## 2016-10-16 MED ORDER — AEROCHAMBER PLUS FLO-VU MEDIUM MISC
1.0000 | Freq: Once | Status: AC
  Administered 2016-10-16: 1

## 2016-10-16 MED ORDER — FUROSEMIDE 20 MG PO TABS
40.0000 mg | ORAL_TABLET | Freq: Once | ORAL | Status: AC
  Administered 2016-10-16: 40 mg via ORAL
  Filled 2016-10-16: qty 2

## 2016-10-16 NOTE — Consult Note (Signed)
Cardiology Consultation:   Patient ID: [name]; [id number]; 1945-04-17   Admit date: 10/16/2016 Date of Consult: 10/16/2016  Primary Care Provider: Debbrah Alar, NP Primary Cardiologist: End  Primary Electrophysiologist:      Patient Profile:   [name] is a 71 y.o. male with a hx of paroxysmal atrial fib  who is being seen today for the evaluation of worsening dyspnea  at the request of  Dr. Ashok Cordia    History of Present Illness:   Mr. Labell  has been on amio for years  Recent dyspnea PFTs shiows severe obstruve lung disease and  reduced diffusion capacity  He has not seen pulmonary  He is very upset tonight He has had progressive dyspnea for the past several weeks , possibly several months.   Has maintained NSR   Hx of CAD Has severe native CAD by cath Aug 2017.  patent LIMA to LAD SVG Y graft to D1 and OM2 Native RCA is small  Has been slowing down - significant dyspnea with ADL Has an appt on Aug. 6 . No fever, no cough,  No blood in stool.  No angina   Hx of CVA ( 1996)   Past Medical History:  Diagnosis Date  . CAD (coronary artery disease)    s/p CABG, s/p DES to LCX  January 2011  . Chronic pain    left sided-[place]  . CVA (cerebral infarction)    left hemiparesis  . Diastolic CHF ([place])   . Dysrhythmia    hx of atrial fibrilation with cardioversion  . GERD (gastroesophageal reflux disease)   . Gout   . Hyperlipidemia   . Hypertension   . Hypothyroidism   . OSA on CPAP   . Sleep apnea   . Stroke ([place]) 1996  . Tubular adenoma of colon 05/2010    Past Surgical History:  Procedure Laterality Date  . CARDIAC CATHETERIZATION N/A 10/25/2015   Procedure: Left Heart Cath and Cors/Grafts Angiography;  Surgeon: Troy Sine, MD;  Location: [facility] CV LAB;  Service: Cardiovascular;  Laterality: N/A;  . CARDIOVERSION  06/20/2011   Procedure: CARDIOVERSION;  Surgeon: Larey Dresser, MD;  Location: [facility] ENDOSCOPY;  Service:  Cardiovascular;  Laterality: N/A;  . CARDIOVERSION N/A 04/26/2015   Procedure: CARDIOVERSION;  Surgeon: Dorothy Spark, MD;  Location: [facility] ENDOSCOPY;  Service: Cardiovascular;  Laterality: N/A;  . CARDIOVERSION N/A 05/10/2015   Procedure: CARDIOVERSION;  Surgeon: Larey Dresser, MD;  Location: [facility];  Service: Cardiovascular;  Laterality: N/A;  . CATARACT EXTRACTION  05/2010   left eye  . CATARACT EXTRACTION  04/2010   right eye  . CORONARY ARTERY BYPASS GRAFT     stent  . ESOPHAGOGASTRODUODENOSCOPY  03-25-2005  . NASAL SEPTUM SURGERY    . PERCUTANEOUS PLACEMENT INTRAVASCULAR STENT CERVICAL CAROTID ARTERY     03-2009; using a drug-eluting platform of the circumflex cornoray artery with a 3.0 x 18 [employer] Promus drug-eluting platform post dilated to 3.75 with a noncompliant balloon.  . TEE WITHOUT CARDIOVERSION  06/20/2011   Procedure: TRANSESOPHAGEAL ECHOCARDIOGRAM (TEE);  Surgeon: Larey Dresser, MD;  Location: [facility] ENDOSCOPY;  Service: Cardiovascular;  Laterality: N/A;     Inpatient Medications: Scheduled Meds: . furosemide  40 mg Oral Once   Continuous Infusions:  PRN Meds:   Allergies:   No Known Allergies  Social History:   Social History   Social History  . Marital status: Married    Spouse name: [name]  .  Number of children: N/A  . Years of education: 72   Occupational History  . retired Hydrographic surveyor   Social History Main Topics  . Smoking status: Never Smoker  . Smokeless tobacco: Never Used  . Alcohol use No     Comment: 1 beer a month  . Drug use: No  . Sexual activity: No   Other Topics Concern  . Not on file   Social History Narrative   Pt lives with wife. Does have stairs, but patient doesn't use them. Pt has completed technical school    Family History:   Family History  Problem Relation Age of Onset  . Lung cancer Father        deceased  . Stroke Mother        deceased-MINISTROKES     ROS:  Please see the history  of present illness.  ROS  All other ROS reviewed and negative.     Physical Exam/Data:   Vitals:   10/16/16 1350 10/16/16 1351 10/16/16 1600  BP: (!) 152/85  (!) 141/92  Pulse: 79  64  Resp: 20  13  Temp: 98.2 F (36.8 C)    TempSrc: Oral    SpO2: 95%  95%  Weight:  252 lb (114.3 kg)   Height:  6\' 3"  (1.905 m)    No intake or output data in the 24 hours ending 10/16/16 1730 Filed Weights   10/16/16 1351  Weight: 252 lb (114.3 kg)   Body mass index is 31.5 kg/m.  General:  Well nourished, well developed,  Elderly male,   upste  HEENT: normal Lymph: no adenopathy Neck: no JVD Endocrine:  No thryomegaly Vascular: No carotid bruits; FA pulses 2+ bilaterally without bruits  Cardiac:  normal S1, S2; RR;  Soft systolic murmur  Lungs:  clear to auscultation bilaterally, no wheezing, rhonchi or rales  Abd: soft, nontender, no hepatomegaly  Ext: no edema Musculoskeletal:  No deformities,  Left sided weakness and reduced motor skills  Neuro:  CNs 2-12 intact,, decreased fine motor skills on left side  Slight dysarthria  Psych:  Normal affect   EKG:  The EKG was personally reviewed and demonstrates:  NSR  Telemetry:  Telemetry was personally reviewed and demonstrates:   NSR   Relevant CV Studies:   Laboratory Data:  Chemistry Recent Labs Lab 10/16/16 1402  NA 140  K 4.5  CL 103  CO2 27  GLUCOSE 134*  BUN 17  CREATININE 1.14  CALCIUM 9.5  GFRNONAA >60  GFRAA >60  ANIONGAP 10    No results for input(s): PROT, ALBUMIN, AST, ALT, ALKPHOS, BILITOT in the last 168 hours. Hematology Recent Labs Lab 10/16/16 1402  WBC 5.2  RBC 4.69  HGB 13.5  HCT 42.3  MCV 90.2  MCH 28.8  MCHC 31.9  RDW 14.1  PLT 180   Cardiac EnzymesNo results for input(s): TROPONINI in the last 168 hours.  Recent Labs Lab 10/16/16 1417  TROPIPOC 0.00    BNP Recent Labs Lab 10/16/16 1402  BNP 88.7    DDimer No results for input(s): DDIMER in the last 168  hours.  Radiology/Studies:  Dg Chest 2 View  Result Date: 10/16/2016 CLINICAL DATA:  Shortness breath. EXAM: CHEST  2 VIEW COMPARISON:  Multiple prior chest x-rays, most recently dated December 12, 2015. FINDINGS: The cardiomediastinal silhouette is normal in size. Normal pulmonary vascularity. Persistent elevation of the right hemidiaphragm with adjacent basilar atelectasis. No focal consolidation, pleural effusion, or  pneumothorax. No acute osseous abnormality. IMPRESSION: No active cardiopulmonary disease. Electronically Signed   By: Titus Dubin M.D.   On: 10/16/2016 14:23    Assessment and Plan:   1. Dyspnea:    O2 sats are 100% We discussed Discontinuing his amiodarone and having him see the atrial fibrillation clinic next week.  He seemed to become very anxious and did not want to leave the hospital. He wanted some answers tonight. I could not tell him he was specifically why he had obstructive lung disease. He has no history of cigarette smoking.  He has an appointment with the pulmonary medicine group on August 6.  He appears to be quite stable from a cardiac standpoint. I could not convince him that he would be safe at home. He insist on seeing someone tonight for the shortness of breath.  We will have the ER doctor discussed this with internal medicine and/or pulmonary.  2. Paroxysmal atrial fibrillation: He has been maintained on low-dose amiodarone-100 mg a day. He's been on it for about one year. It's unlikely that would cause amiodarone toxicity but sternly this cannot be ruled out.  We will need to have him follow-up with the A. fib clinic once he is discharged from the hospital and will need to have him see an electrophysiologist as part of out effort moving forward. Gentry Roch, MD  10/16/2016 5:30 PM

## 2016-10-16 NOTE — Discharge Instructions (Addendum)
It was our pleasure to provide your ER care today - we hope that you feel better.  Limit salt intake.   Take one extra dose of your lasix tomorrow.  Stop your amiodarone.  Return to ER if worse, new symptoms, fevers, chest pain, increased trouble breathing, other concern.

## 2016-10-16 NOTE — ED Provider Notes (Signed)
Dr. Acie Fredrickson (cardiology) did come down and see patient.  He will arrange for pt to be seen at the a.fib clinic next week.  The pt does have a pulmonology appt on Aug. 6.  Pt walked around the entire pod and O2 sats were excellent.  No need for hospital admission.  Pt has good follow up.  I spoke with the patient a long time and he is in agreement to go home.  He will be started on an inhaler in case that will help his sob.  His amiodarone will be stopped per Dr. Elmarie Shiley recommendations.    Isla Pence, MD 10/16/16 [phone]

## 2016-10-16 NOTE — ED Notes (Signed)
Pt ambulated with this RN around Pod C, pts oxygen level stayed about 98% the entire time. Dr. Gilford Raid made aware

## 2016-10-16 NOTE — ED Triage Notes (Signed)
Pt endorses shob x 2 days. Pt has hx of CABG and stent placement. Denies chest pain. Has hx of CVA with right side residual. Pt sleeps with o2 on and has no used extra o2 since shob started. VSS.

## 2016-10-16 NOTE — ED Notes (Signed)
Dr. Haviland at bedside. 

## 2016-10-16 NOTE — ED Provider Notes (Signed)
[facility] DEPT Provider Note   CSN: [id number] Arrival date & time: 10/16/16  1336     History   Chief Complaint Chief Complaint  Patient presents with  . Shortness of Breath    HPI [name] is a 71 y.o. male.  Patient c/o feeling mildly sob, and mild generalized weakness for the past 2 days. Symptoms gradual onset, persistent, no specific exacerbating or alleviating factors. Mild swelling to ankles. ?orthopnea/chr. No pnd. Denies chest pain or discomfort. No cough, sore throat, or uri c/o. No fever or chills. States compliant w home meds. No known fluid/diet indiscretion.    The history is provided by the patient.  Shortness of Breath  Pertinent negatives include no fever, no headaches, no sore throat, no neck pain, no cough, no chest pain, no vomiting, no abdominal pain and no rash.    Past Medical History:  Diagnosis Date  . CAD (coronary artery disease)    s/p CABG, s/p DES to LCX  January 2011  . Chronic pain    left sided-Kristeins  . CVA (cerebral infarction)    left hemiparesis  . Diastolic CHF ([place])   . Dysrhythmia    hx of atrial fibrilation with cardioversion  . GERD (gastroesophageal reflux disease)   . Gout   . Hyperlipidemia   . Hypertension   . Hypothyroidism   . OSA on CPAP   . Sleep apnea   . Stroke ([place]) 1996  . Tubular adenoma of colon 05/2010    Patient Active Problem List   Diagnosis Date Noted  . Acute respiratory failure with hypoxia and hypercapnia ([place]) 12/12/2015  . Coronary artery disease due to lipid rich plaque 11/04/2015  . CAD S/P CFX DES 2011 04/25/2015  . Persistent atrial fibrillation ([place]) 04/25/2015  . Vasovagal syncope   . Constipation 04/24/2015  . Diastolic CHF -EF 93% with grade 2 DD 2014 04/24/2015  . B12 deficiency 08/11/2013  . Sensory disturbance 08/11/2013  . Ataxia 08/10/2013  . Insomnia 05/21/2013  . Hypothyroidism, acquired 02/15/2013  . Fatigue 11/17/2012  . Spastic hemiplegia affecting  dominant side ([place]) 07/21/2011  . Long term current use of anticoagulant therapy 06/24/2011  . Hypogonadism male 04/10/2011  . Allergic rhinitis 02/13/2010  . BENIGN POSITIONAL VERTIGO 11/23/2009  . 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 HYPOTHYROIDISM 04/02/2009  . Type 2 diabetes mellitus without complication, without long-term current use of insulin ([place]) 10/05/2007  . Obstructive sleep apnea-Failed CPAP 06/17/2007  . Hyperlipidemia 12/12/2006  . GOUT 12/12/2006  . Essential hypertension 12/12/2006  . History of stroke with residual deficit 12/12/2006  . GERD 12/12/2006    Past Surgical History:  Procedure Laterality Date  . CARDIAC CATHETERIZATION N/A 10/25/2015   Procedure: Left Heart Cath and Cors/Grafts Angiography;  Surgeon: Troy Sine, MD;  Location: [facility];  Service: Cardiovascular;  Laterality: N/A;  . CARDIOVERSION  06/20/2011   Procedure: CARDIOVERSION;  Surgeon: Larey Dresser, MD;  Location: [facility] ENDOSCOPY;  Service: Cardiovascular;  Laterality: N/A;  . CARDIOVERSION N/A 04/26/2015   Procedure: CARDIOVERSION;  Surgeon: Dorothy Spark, MD;  Location: [facility] ENDOSCOPY;  Service: Cardiovascular;  Laterality: N/A;  . CARDIOVERSION N/A 05/10/2015   Procedure: CARDIOVERSION;  Surgeon: Larey Dresser, MD;  Location: [facility];  Service: Cardiovascular;  Laterality: N/A;  . CATARACT EXTRACTION  05/2010   left eye  . CATARACT EXTRACTION  04/2010   right eye  . CORONARY ARTERY BYPASS GRAFT     stent  . ESOPHAGOGASTRODUODENOSCOPY  03-25-2005  . NASAL SEPTUM SURGERY    . PERCUTANEOUS PLACEMENT INTRAVASCULAR STENT CERVICAL CAROTID ARTERY     03-2009; using a drug-eluting platform of the circumflex cornoray artery with a 3.0 x 18 [employer] Promus drug-eluting platform post dilated to 3.75 with a noncompliant balloon.  . TEE WITHOUT CARDIOVERSION  06/20/2011   Procedure: TRANSESOPHAGEAL ECHOCARDIOGRAM (TEE);  Surgeon: Larey Dresser, MD;  Location: [facility];  Service:  Cardiovascular;  Laterality: N/A;       Home Medications    Prior to Admission medications   Medication Sig Start Date End Date Taking? Authorizing Provider  allopurinol (ZYLOPRIM) 100 MG tablet TAKE 2 TABLETS BY MOUTH DAILY. D/C PREVIOUS SCRIPTS FOR THIS MEDICATION 05/21/16   Debbrah Alar, NP  amiodarone (PACERONE) 100 MG tablet Take 1 tablet (100 mg total) by mouth daily. 12/21/15   Larey Dresser, MD  atorvastatin (LIPITOR) 40 MG tablet TAKE 2 TABLETS (80 MG TOTAL) BY MOUTH DAILY. 04/29/16   [name] T, PA-C  benazepril (LOTENSIN) 10 MG tablet Take 1 tablet (10 mg total) by mouth daily. 08/25/16   End, Harrell Gave, MD  benazepril (LOTENSIN) 5 MG tablet Take 1 tablet (5 mg total) by mouth daily. Take 1 tablet daily with the 10 mg tablet of Benazepril = 15 mg daily 09/18/16   End, Harrell Gave, MD  fluticasone (FLONASE) 50 MCG/ACT nasal spray Place 2 sprays into both nostrils daily. 06/02/14   Brunetta Jeans, PA-C  furosemide (LASIX) 40 MG tablet Take 1 tablet (40 mg total) by mouth every other day. 05/21/16   Debbrah Alar, NP  isosorbide mononitrate (IMDUR) 30 MG 24 hr tablet Take 0.5 tablets (15 mg total) by mouth daily. 11/05/15   [name] T, PA-C  lactulose (CHRONULAC) 10 GM/15ML solution Take 15 mLs (10 g total) by mouth daily as needed for mild constipation. 07/16/16   Debbrah Alar, NP  levothyroxine (SYNTHROID, LEVOTHROID) 112 MCG tablet Take 1 tablet (112 mcg total) by mouth daily before breakfast. 07/16/16   Debbrah Alar, NP  metFORMIN (GLUCOPHAGE) 500 MG tablet Take 1 tablet (500 mg total) by mouth daily with breakfast. 05/21/16   Debbrah Alar, NP  Multiple Vitamins-Minerals (MULTIVITAMIN GUMMIES MENS PO) Take 1 tablet by mouth daily.    [provider]  nitroGLYCERIN (NITROSTAT) 0.4 MG SL tablet Place 1 tablet (0.4 mg total) under the tongue every 5 (five) minutes as needed for chest pain. Up to 3 doses 03/05/15   Debbrah Alar, NP    pantoprazole (PROTONIX) 40 MG tablet Take 1 tablet (40 mg total) by mouth daily. 10/09/16   Debbrah Alar, NP  traMADol (ULTRAM) 50 MG tablet Take 1 tablet (50 mg total) by mouth 2 (two) times daily. 09/29/16   Kirsteins, Luanna Salk, MD  vitamin B-12 1000 MCG tablet Take 1 tablet (1,000 mcg total) by mouth daily. 08/11/13   Orson Eva, MD  warfarin (COUMADIN) 2.5 MG tablet Take 1/2 tablet daily except 1 tablet on Monday, Wednesday, and Friday or as directed by Coumadin Clinic 09/02/16   Larey Dresser, MD    Family History Family History  Problem Relation Age of Onset  . Lung cancer Father        deceased  . Stroke Mother        deceased-MINISTROKES    Social History Social History  Substance Use Topics  . Smoking status: Never Smoker  . Smokeless tobacco: Never Used  . Alcohol use No     Comment: 1 beer a  month     Allergies   Patient has no known allergies.   Review of Systems Review of Systems  Constitutional: Negative for fever.  HENT: Negative for sore throat.   Eyes: Negative for redness.  Respiratory: Positive for shortness of breath. Negative for cough.   Cardiovascular: Negative for chest pain.  Gastrointestinal: Negative for abdominal pain and vomiting.  Endocrine: Negative for polyuria.  Genitourinary: Negative for dysuria and flank pain.  Musculoskeletal: Negative for back pain and neck pain.  Skin: Negative for rash.  Neurological: Negative for headaches.  Hematological: Does not bruise/bleed easily.  Psychiatric/Behavioral: Negative for confusion.     Physical Exam Updated Vital Signs BP (!) 152/85 (BP Location: Right Arm)   Pulse 79   Temp 98.2 F (36.8 C) (Oral)   Resp 20   Ht 1.905 m (6\' 3" )   Wt 114.3 kg (252 lb)   SpO2 95%   BMI 31.50 kg/m   Physical Exam  Constitutional: He appears well-developed and well-nourished. No distress.  HENT:  Mouth/Throat: Oropharynx is clear and moist.  Eyes: Conjunctivae are normal.  Neck: Neck  supple. No tracheal deviation present.  Cardiovascular: Normal rate, regular rhythm, normal heart sounds and intact distal pulses.  Exam reveals no gallop and no friction rub.   No murmur heard. Pulmonary/Chest: Effort normal and breath sounds normal. No accessory muscle usage. No respiratory distress.  Abdominal: Soft. Bowel sounds are normal. He exhibits no distension. There is no tenderness.  Genitourinary:  Genitourinary Comments: No cva tenderness  Musculoskeletal: He exhibits no tenderness.  Mild bil ankle edema.   Neurological: He is alert.  Speech clear/fluent. Motor intact bil,, sens grossly intact.   Skin: Skin is warm and dry. No rash noted. He is not diaphoretic.  Psychiatric: He has a normal mood and affect.  Nursing note and vitals reviewed.    ED Treatments / Results  Labs (all labs ordered are listed, but only abnormal results are displayed) Results for orders placed or performed during the hospital encounter of 12/45/80  Basic metabolic panel  Result Value Ref Range   Sodium 140 135 - 145 mmol/L   Potassium 4.5 3.5 - 5.1 mmol/L   Chloride 103 101 - 111 mmol/L   CO2 27 22 - 32 mmol/L   Glucose, Bld 134 (H) 65 - 99 mg/dL   BUN 17 6 - 20 mg/dL   Creatinine, Ser 1.14 0.61 - 1.24 mg/dL   Calcium 9.5 8.9 - 10.3 mg/dL   GFR calc non Af Amer >60 >60 mL/min   GFR calc Af Amer >60 >60 mL/min   Anion gap 10 5 - 15  CBC  Result Value Ref Range   WBC 5.2 4.0 - 10.5 K/uL   RBC 4.69 4.22 - 5.81 MIL/uL   Hemoglobin 13.5 13.0 - 17.0 g/dL   HCT 42.3 39.0 - 52.0 %   MCV 90.2 78.0 - 100.0 fL   MCH 28.8 26.0 - 34.0 pg   MCHC 31.9 30.0 - 36.0 g/dL   RDW 14.1 11.5 - 15.5 %   Platelets 180 150 - 400 K/uL  Protime-INR  Result Value Ref Range   Prothrombin Time 27.6 (H) 11.4 - 15.2 seconds   INR 2.52   I-stat troponin, ED  Result Value Ref Range   Troponin i, poc 0.00 0.00 - 0.08 ng/mL   Comment 3           Dg Chest 2 View  Result Date: 10/16/2016 CLINICAL DATA:   Shortness breath.  EXAM: CHEST  2 VIEW COMPARISON:  Multiple prior chest x-rays, most recently dated December 12, 2015. FINDINGS: The cardiomediastinal silhouette is normal in size. Normal pulmonary vascularity. Persistent elevation of the right hemidiaphragm with adjacent basilar atelectasis. No focal consolidation, pleural effusion, or pneumothorax. No acute osseous abnormality. IMPRESSION: No active cardiopulmonary disease. Electronically Signed   By: Titus Dubin M.D.   On: 10/16/2016 14:23    EKG  EKG Interpretation  Date/Time:  Thursday October 16 2016 13:46:52 EDT Ventricular Rate:  78 PR Interval:  168 QRS Duration: 94 QT Interval:  362 QTC Calculation: 412 R Axis:   -48 Text Interpretation:  Normal sinus rhythm Left axis deviation Left anterior fascicular block Moderate voltage criteria for LVH, may be normal variant Nonspecific T wave abnormality Confirmed by [name] [id number]) on 10/16/2016 3:08:44 PM       Radiology Dg Chest 2 View  Result Date: 10/16/2016 CLINICAL DATA:  Shortness breath. EXAM: CHEST  2 VIEW COMPARISON:  Multiple prior chest x-rays, most recently dated December 12, 2015. FINDINGS: The cardiomediastinal silhouette is normal in size. Normal pulmonary vascularity. Persistent elevation of the right hemidiaphragm with adjacent basilar atelectasis. No focal consolidation, pleural effusion, or pneumothorax. No acute osseous abnormality. IMPRESSION: No active cardiopulmonary disease. Electronically Signed   By: Titus Dubin M.D.   On: 10/16/2016 14:23    Procedures Procedures (including critical care time)  Medications Ordered in ED Medications  furosemide (LASIX) tablet 40 mg (not administered)     Initial Impression / Assessment and Plan / ED Course  I have reviewed the triage vital signs and the nursing notes.  Pertinent labs & imaging results that were available during my care of the patient were reviewed by me and considered in my medical decision  making (see chart for details).  Pt on lasix at home.  Will give extra dose in ED today, lasix 40 mg po.  Reviewed nursing notes and prior charts for additional history.   Notes made from recent pulmonology clinic visit that pt is noted with impaired pulm fxn tests, and that they will discuss w pts cardiologist whether to continue amiodarone  - pt expresses frustration about his doctors making no decision on this, as well as his ongoing symptoms, and requests we call his cardiologist.   I reassured that todays trop, and cxr appear fine.  His pulse ox is 99% on room air. Hr 60-70. rr normal.  Pt adament that we contact cardiology - cardiology called.   Discussed pt with Dr Acie Fredrickson - he will see in ED.   Final Clinical Impressions(s) / ED Diagnoses   Final diagnoses:  None    New Prescriptions New Prescriptions   No medications on file     [name], MD 10/16/16 1731

## 2016-10-17 ENCOUNTER — Telehealth: Payer: Self-pay | Admitting: Internal Medicine

## 2016-10-17 NOTE — Telephone Encounter (Signed)
New message    Pt is calling asking for a call back. He said he was taken off of his amiodarone and wants to talk to RN about this. Please call.

## 2016-10-17 NOTE — Telephone Encounter (Signed)
Pt states he went to ER yesterday afternoon, shortness of breath. Pt states he was told to stop amiodarone, schedule appointment in [facility] next week, use Proventil Inhaler 2 puffs prn shortness of breath.

## 2016-10-17 NOTE — Telephone Encounter (Signed)
Discussed with patient

## 2016-10-17 NOTE — Telephone Encounter (Signed)
I think it is reasonable to hold amiodarone pending pulmonary evaluation and to use albuterol as needed. Hopefully, the a-fib clinic will be able to discuss other antiarrhythmic options with him. Thanks for the update.  Nelva Bush, MD [facility] Pager: [phone]

## 2016-10-17 NOTE — Telephone Encounter (Signed)
Pt states he has used Proventil Inhaler one  time and it did help his shortness of breath. Pt stopped amiodarone yesterday. Pt has been scheduled in the [facility] Fib Clinic 10/22/16 1:30 PM.  Pt wanted Dr End to be aware of this plan. Pt advised I will forward to Dr End for review.

## 2016-10-20 ENCOUNTER — Ambulatory Visit: Payer: Medicare Other | Admitting: Family

## 2016-10-20 ENCOUNTER — Telehealth: Payer: Self-pay | Admitting: Emergency Medicine

## 2016-10-20 ENCOUNTER — Institutional Professional Consult (permissible substitution): Payer: Medicare Other | Admitting: Emergency Medicine

## 2016-10-20 NOTE — Telephone Encounter (Signed)
Spoke with pt to notify that office would have to be closed this am due to power outage. Left a message. We will have to reschedule.

## 2016-10-21 ENCOUNTER — Ambulatory Visit (INDEPENDENT_AMBULATORY_CARE_PROVIDER_SITE_OTHER): Payer: Medicare Other | Admitting: Emergency Medicine

## 2016-10-21 ENCOUNTER — Encounter: Payer: Self-pay | Admitting: Emergency Medicine

## 2016-10-21 VITALS — BP 124/80 | HR 78 | Ht 75.0 in | Wt 259.0 lb

## 2016-10-21 DIAGNOSIS — I25119 Atherosclerotic heart disease of native coronary artery with unspecified angina pectoris: Secondary | ICD-10-CM | POA: Diagnosis not present

## 2016-10-21 DIAGNOSIS — J849 Interstitial pulmonary disease, unspecified: Secondary | ICD-10-CM | POA: Diagnosis not present

## 2016-10-21 DIAGNOSIS — R0609 Other forms of dyspnea: Secondary | ICD-10-CM | POA: Insufficient documentation

## 2016-10-21 DIAGNOSIS — R0600 Dyspnea, unspecified: Secondary | ICD-10-CM

## 2016-10-21 NOTE — Assessment & Plan Note (Signed)
He has mixed obstruction and restriction on pulmonary function testing although unclear that his technique was adequate to allow true quantification. FEV1 severely decreased at 48% predicted. Lung volumes are restricted. To better evaluate he underwent a chest x-ray that did not show any evidence of interstitial disease. This argues against amiodarone toxicity. He does have an elevated right hemidiaphragm that is likely a contributor, as is his obesity. All the same he believes that he feels better since the amiodarone was stopped. He also tells me that he thinks he benefited from albuterol, which she has only taken twice since he went to ED. unclear to me the contribution of obstruction based on his clinical description. I believe it is appropriate to hold the amiodarone for now. I will check a CT scan of his chest to ensure no evidence of subtle interstitial disease. Given the unclear contribution of obstruction I will repeat his pulmonary function testing in several weeks. He tells me that he believes he will perform better on the PFT because he feels better now.

## 2016-10-21 NOTE — Patient Instructions (Addendum)
We will repeat your pulmonary function testing early September to compare with July.  We will perform a high resolution CT scan of your chest Agree with staying off amiodarone for now Keep albuterol available to use 2 puffs as needed for shortness of breath Depending on your breathing tests, we will decide whether you would benefit from a different kind of inhaler.  Follow with Dr Lamonte Sakai in 1 month

## 2016-10-21 NOTE — Progress Notes (Signed)
Subjective:    Patient ID: [name], male    DOB: April 18, 1945, 71 y.o.   MRN: [id number]  HPI 71 year old man never smoker with a history of coronary disease and CABG, chronic diastolic CHF, paroxysmal atrial fibrillation, complicated by stroke. He also has obstructive sleep apnea. Review of the notes indicates that his benazepril was increased in early July. He is on amiodarone and has been on this medication for 1 year after a hospitalization. He is referred for dyspnea and abnormal PFT.   He has undergone pulmonary function testing on 09/23/16 that I have reviewed. The test did not reach ATS standards. Spirometry was consistent with mixed obstruction and restriction. His lung volumes are restricted. His diffusion capacity was decreased but corrected to the normal range when adjusted for his alveolar volume.  He was just in the emergency department on 8/2 with complaints of "uneasiness, dyspnea". He had a chest x-ray at that time that I reviewed that shows no significant infiltrates, normal heart size, chronically elevated R HD. He received extra lasix, doesn't believe that his breathing was affected. He was given SABA and felt that it helped some. He did not desaturate with ambulation in the ED. The amiodarone was stopped at that visit. He believes that he has significantly improved since the amiodarone was stopped. Feels close to baseline.    Review of Systems  Constitutional: Negative for fever and unexpected weight change.  HENT: Negative for congestion, dental problem, ear pain, nosebleeds, postnasal drip, rhinorrhea, sinus pressure, sneezing, sore throat and trouble swallowing.   Eyes: Negative for redness and itching.  Respiratory: Positive for shortness of breath. Negative for cough, chest tightness and wheezing.   Cardiovascular: Negative for palpitations and leg swelling.  Gastrointestinal: Negative for nausea and vomiting.  Genitourinary: Negative for dysuria.    Musculoskeletal: Negative for joint swelling.  Skin: Negative for rash.  Neurological: Negative for headaches.  Hematological: Does not bruise/bleed easily.  Psychiatric/Behavioral: Negative for dysphoric mood. The patient is not nervous/anxious.    Past Medical History:  Diagnosis Date  . CAD (coronary artery disease)    s/p CABG, s/p DES to LCX  January 2011  . Chronic pain    left sided-Kristeins  . CVA (cerebral infarction)    left hemiparesis  . Diastolic CHF ([place])   . Dysrhythmia    hx of atrial fibrilation with cardioversion  . GERD (gastroesophageal reflux disease)   . Gout   . Hyperlipidemia   . Hypertension   . Hypothyroidism   . OSA on CPAP   . Sleep apnea   . Stroke ([place]) 1996  . Tubular adenoma of colon 05/2010     Family History  Problem Relation Age of Onset  . Lung cancer Father        deceased  . Stroke Mother        deceased-MINISTROKES     Social History   Social History  . Marital status: Married    Spouse name: [name]  . Number of children: N/A  . Years of education: 44   Occupational History  . retired Hydrographic surveyor   Social History Main Topics  . Smoking status: Never Smoker  . Smokeless tobacco: Never Used  . Alcohol use No     Comment: 1 beer a month  . Drug use: No  . Sexual activity: No   Other Topics Concern  . Not on file   Social History Narrative   Pt lives with wife.  Does have stairs, but patient doesn't use them. Pt has completed technical school  he worked as a Airline pilot at an airport Has lived [place], [place] Was in the air force, Mining engineer.   No Known Allergies   Outpatient Medications Prior to Visit  Medication Sig Dispense Refill  . allopurinol (ZYLOPRIM) 100 MG tablet TAKE 2 TABLETS BY MOUTH DAILY. D/C PREVIOUS SCRIPTS FOR THIS MEDICATION 180 tablet 1  . atorvastatin (LIPITOR) 40 MG tablet TAKE 2 TABLETS (80 MG TOTAL) BY MOUTH DAILY. 180 tablet 3  . benazepril (LOTENSIN) 10 MG tablet Take 1  tablet (10 mg total) by mouth daily. 135 tablet 1  . benazepril (LOTENSIN) 5 MG tablet Take 1 tablet (5 mg total) by mouth daily. Take 1 tablet daily with the 10 mg tablet of Benazepril = 15 mg daily 90 tablet 3  . fluticasone (FLONASE) 50 MCG/ACT nasal spray Place 2 sprays into both nostrils daily. 9.9 g 1  . furosemide (LASIX) 40 MG tablet Take 1 tablet (40 mg total) by mouth every other day. 45 tablet 1  . isosorbide mononitrate (IMDUR) 30 MG 24 hr tablet Take 0.5 tablets (15 mg total) by mouth daily. 30 tablet 6  . lactulose (CHRONULAC) 10 GM/15ML solution Take 15 mLs (10 g total) by mouth daily as needed for mild constipation. 240 mL 1  . levothyroxine (SYNTHROID, LEVOTHROID) 112 MCG tablet Take 1 tablet (112 mcg total) by mouth daily before breakfast. 90 tablet 0  . metFORMIN (GLUCOPHAGE) 500 MG tablet Take 1 tablet (500 mg total) by mouth daily with breakfast. 90 tablet 1  . Multiple Vitamins-Minerals (MULTIVITAMIN GUMMIES MENS PO) Take 1 tablet by mouth daily.    . nitroGLYCERIN (NITROSTAT) 0.4 MG SL tablet Place 1 tablet (0.4 mg total) under the tongue every 5 (five) minutes as needed for chest pain. Up to 3 doses 10 tablet 2  . pantoprazole (PROTONIX) 40 MG tablet Take 1 tablet (40 mg total) by mouth daily. 90 tablet 0  . traMADol (ULTRAM) 50 MG tablet Take 1 tablet (50 mg total) by mouth 2 (two) times daily. 180 tablet 0  . vitamin B-12 1000 MCG tablet Take 1 tablet (1,000 mcg total) by mouth daily. 30 tablet 0  . warfarin (COUMADIN) 2.5 MG tablet Take 1/2 tablet daily except 1 tablet on Monday, Wednesday, and Friday or as directed by Coumadin Clinic 70 tablet 0   No facility-administered medications prior to visit.         Objective:   Physical Exam Vitals:   10/21/16 1428  BP: 124/80  Pulse: 78  SpO2: 94%  Weight: 259 lb (117.5 kg)  Height: 6\' 3"  (1.905 m)   Gen: Pleasant, Obese in no distress,  normal affect  ENT: No lesions,  mouth clear,  oropharynx clear, no postnasal  drip  Neck: No JVD, no stridor  Lungs: No use of accessory muscles, decreased at both bases, no wheezes, no crackles  Cardiovascular: RRR, heart sounds normal, no murmur or gallops, no peripheral edema  Musculoskeletal: No deformities, no cyanosis or clubbing  Neuro: alert, non focal  Skin: Warm, no lesions or rashes        Assessment & Plan:  Dyspnea on exertion He has mixed obstruction and restriction on pulmonary function testing although unclear that his technique was adequate to allow true quantification. FEV1 severely decreased at 48% predicted. Lung volumes are restricted. To better evaluate he underwent a chest x-ray that did not show any evidence of interstitial disease. This argues  against amiodarone toxicity. He does have an elevated right hemidiaphragm that is likely a contributor, as is his obesity. All the same he believes that he feels better since the amiodarone was stopped. He also tells me that he thinks he benefited from albuterol, which she has only taken twice since he went to ED. unclear to me the contribution of obstruction based on his clinical description. I believe it is appropriate to hold the amiodarone for now. I will check a CT scan of his chest to ensure no evidence of subtle interstitial disease. Given the unclear contribution of obstruction I will repeat his pulmonary function testing in several weeks. He tells me that he believes he will perform better on the PFT because he feels better now.  Baltazar Apo, MD, PhD 10/21/2016, 5:04 PM [facility] [phone] or if no answer [phone]

## 2016-10-22 ENCOUNTER — Other Ambulatory Visit: Payer: Self-pay

## 2016-10-22 ENCOUNTER — Ambulatory Visit (HOSPITAL_COMMUNITY)
Admission: RE | Admit: 2016-10-22 | Discharge: 2016-10-22 | Disposition: A | Discharge status: Home / Self Care | Payer: Medicare Other | Source: Ambulatory Visit | Attending: Nurse Practitioner | Admitting: Nurse Practitioner

## 2016-10-22 ENCOUNTER — Encounter (HOSPITAL_COMMUNITY): Payer: Self-pay | Admitting: Nurse Practitioner

## 2016-10-22 VITALS — BP 152/84 | HR 80 | Ht 75.0 in | Wt 258.4 lb

## 2016-10-22 DIAGNOSIS — M109 Gout, unspecified: Secondary | ICD-10-CM | POA: Diagnosis not present

## 2016-10-22 DIAGNOSIS — Z951 Presence of aortocoronary bypass graft: Secondary | ICD-10-CM | POA: Insufficient documentation

## 2016-10-22 DIAGNOSIS — Z79899 Other long term (current) drug therapy: Secondary | ICD-10-CM | POA: Insufficient documentation

## 2016-10-22 DIAGNOSIS — Z8673 Personal history of transient ischemic attack (TIA), and cerebral infarction without residual deficits: Secondary | ICD-10-CM | POA: Diagnosis not present

## 2016-10-22 DIAGNOSIS — I110 Hypertensive heart disease with heart failure: Secondary | ICD-10-CM | POA: Insufficient documentation

## 2016-10-22 DIAGNOSIS — Z7901 Long term (current) use of anticoagulants: Secondary | ICD-10-CM | POA: Insufficient documentation

## 2016-10-22 DIAGNOSIS — I480 Paroxysmal atrial fibrillation: Secondary | ICD-10-CM | POA: Insufficient documentation

## 2016-10-22 DIAGNOSIS — I4891 Unspecified atrial fibrillation: Secondary | ICD-10-CM | POA: Diagnosis present

## 2016-10-22 DIAGNOSIS — Z7984 Long term (current) use of oral hypoglycemic drugs: Secondary | ICD-10-CM | POA: Diagnosis not present

## 2016-10-22 DIAGNOSIS — G4733 Obstructive sleep apnea (adult) (pediatric): Secondary | ICD-10-CM | POA: Insufficient documentation

## 2016-10-22 DIAGNOSIS — I2510 Atherosclerotic heart disease of native coronary artery without angina pectoris: Secondary | ICD-10-CM | POA: Diagnosis not present

## 2016-10-22 DIAGNOSIS — K219 Gastro-esophageal reflux disease without esophagitis: Secondary | ICD-10-CM | POA: Insufficient documentation

## 2016-10-22 DIAGNOSIS — E785 Hyperlipidemia, unspecified: Secondary | ICD-10-CM | POA: Insufficient documentation

## 2016-10-22 DIAGNOSIS — I5032 Chronic diastolic (congestive) heart failure: Secondary | ICD-10-CM | POA: Diagnosis not present

## 2016-10-22 DIAGNOSIS — E039 Hypothyroidism, unspecified: Secondary | ICD-10-CM | POA: Diagnosis not present

## 2016-10-22 DIAGNOSIS — R0602 Shortness of breath: Secondary | ICD-10-CM | POA: Insufficient documentation

## 2016-10-23 NOTE — Progress Notes (Signed)
Primary Care Physician: Debbrah Alar, NP Referring Physician:Dr. Cathie Olden Cardiologist: Dr. Terri Piedra is a 71 y.o. male with a h/o CAD, prior CVA, HF, HTN, OSA on cpap for OSA in the afib clinc for f/u from recent ER visit.Marland Kitchen He has h/o afib, maintaining SR on amiodarone since 2026. He has developed recent shortness of breath for which he presented to the ER. It was decided that amiodarone may be the etiology for shortness of breath and stopped.   He states that his shortness of breath is better off amiodarone now less one week. He is to f/u with pulmonologist and chest CT is pending. CXR did not show ILD. He is maintaining SR.  Today, he denies symptoms of palpitations, chest pain, , orthopnea, PND, lower extremity edema, dizziness, presyncope, syncope, or neurologic sequela. Positive for shortness of breath, improved. The patient is tolerating medications without difficulties and is otherwise without complaint today.   Past Medical History:  Diagnosis Date  . CAD (coronary artery disease)    s/p CABG, s/p DES to LCX  January 2011  . Chronic pain    left sided-[place]  . CVA (cerebral infarction)    left hemiparesis  . Diastolic CHF ([place])   . Dysrhythmia    hx of atrial fibrilation with cardioversion  . GERD (gastroesophageal reflux disease)   . Gout   . Hyperlipidemia   . Hypertension   . Hypothyroidism   . OSA on CPAP   . Sleep apnea   . Stroke ([place]) 1996  . Tubular adenoma of colon 05/2010   Past Surgical History:  Procedure Laterality Date  . CARDIAC CATHETERIZATION N/A 10/25/2015   Procedure: Left Heart Cath and Cors/Grafts Angiography;  Surgeon: Troy Sine, MD;  Location: [place] [facility];  Service: Cardiovascular;  Laterality: N/A;  . CARDIOVERSION  06/20/2011   Procedure: CARDIOVERSION;  Surgeon: Larey Dresser, MD;  Location: [facility] ENDOSCOPY;  Service: Cardiovascular;  Laterality: N/A;  . CARDIOVERSION N/A 04/26/2015   Procedure: CARDIOVERSION;   Surgeon: Dorothy Spark, MD;  Location: [facility] ENDOSCOPY;  Service: Cardiovascular;  Laterality: N/A;  . CARDIOVERSION N/A 05/10/2015   Procedure: CARDIOVERSION;  Surgeon: Larey Dresser, MD;  Location: [place];  Service: Cardiovascular;  Laterality: N/A;  . CATARACT EXTRACTION  05/2010   left eye  . CATARACT EXTRACTION  04/2010   right eye  . CORONARY ARTERY BYPASS GRAFT     stent  . ESOPHAGOGASTRODUODENOSCOPY  03-25-2005  . NASAL SEPTUM SURGERY    . PERCUTANEOUS PLACEMENT INTRAVASCULAR STENT CERVICAL CAROTID ARTERY     03-2009; using a drug-eluting platform of the circumflex cornoray artery with a 3.0 x 18 [employer] Promus drug-eluting platform post dilated to 3.75 with a noncompliant balloon.  . TEE WITHOUT CARDIOVERSION  06/20/2011   Procedure: TRANSESOPHAGEAL ECHOCARDIOGRAM (TEE);  Surgeon: Larey Dresser, MD;  Location: [facility] ENDOSCOPY;  Service: Cardiovascular;  Laterality: N/A;    Current Outpatient Prescriptions  Medication Sig Dispense Refill  . allopurinol (ZYLOPRIM) 100 MG tablet TAKE 2 TABLETS BY MOUTH DAILY. D/C PREVIOUS SCRIPTS FOR THIS MEDICATION 180 tablet 1  . atorvastatin (LIPITOR) 40 MG tablet TAKE 2 TABLETS (80 MG TOTAL) BY MOUTH DAILY. 180 tablet 3  . benazepril (LOTENSIN) 10 MG tablet Take 1 tablet (10 mg total) by mouth daily. 135 tablet 1  . benazepril (LOTENSIN) 5 MG tablet Take 1 tablet (5 mg total) by mouth daily. Take 1 tablet daily with the 10 mg tablet of Benazepril =  15 mg daily 90 tablet 3  . fluticasone (FLONASE) 50 MCG/ACT nasal spray Place 2 sprays into both nostrils daily. 9.9 g 1  . furosemide (LASIX) 40 MG tablet Take 1 tablet (40 mg total) by mouth every other day. 45 tablet 1  . isosorbide mononitrate (IMDUR) 30 MG 24 hr tablet Take 0.5 tablets (15 mg total) by mouth daily. 30 tablet 6  . lactulose (CHRONULAC) 10 GM/15ML solution Take 15 mLs (10 g total) by mouth daily as needed for mild constipation. 240 mL 1  . levothyroxine (SYNTHROID,  LEVOTHROID) 112 MCG tablet Take 1 tablet (112 mcg total) by mouth daily before breakfast. 90 tablet 0  . metFORMIN (GLUCOPHAGE) 500 MG tablet Take 1 tablet (500 mg total) by mouth daily with breakfast. 90 tablet 1  . Multiple Vitamins-Minerals (MULTIVITAMIN GUMMIES MENS PO) Take 1 tablet by mouth daily.    . nitroGLYCERIN (NITROSTAT) 0.4 MG SL tablet Place 1 tablet (0.4 mg total) under the tongue every 5 (five) minutes as needed for chest pain. Up to 3 doses 10 tablet 2  . pantoprazole (PROTONIX) 40 MG tablet Take 1 tablet (40 mg total) by mouth daily. 90 tablet 0  . traMADol (ULTRAM) 50 MG tablet Take 1 tablet (50 mg total) by mouth 2 (two) times daily. 180 tablet 0  . traMADol (ULTRAM-ER) 200 MG 24 hr tablet Take 200 mg by mouth 2 (two) times daily.    . vitamin B-12 1000 MCG tablet Take 1 tablet (1,000 mcg total) by mouth daily. 30 tablet 0  . warfarin (COUMADIN) 2.5 MG tablet Take 1/2 tablet daily except 1 tablet on Monday, Wednesday, and Friday or as directed by [facility] 70 tablet 0   No current facility-administered medications for this encounter.     No Known Allergies  Social History   Social History  . Marital status: Married    Spouse name: [name]  . Number of children: N/A  . Years of education: 34   Occupational History  . retired Hydrographic surveyor   Social History Main Topics  . Smoking status: Never Smoker  . Smokeless tobacco: Never Used  . Alcohol use No     Comment: 1 beer a month  . Drug use: No  . Sexual activity: No   Other Topics Concern  . Not on file   Social History Narrative   Pt lives with wife. Does have stairs, but patient doesn't use them. Pt has completed technical school    Family History  Problem Relation Age of Onset  . Lung cancer Father        deceased  . Stroke Mother        deceased-[place]ROKES    ROS- All systems are reviewed and negative except as per the HPI above  Physical Exam: Vitals:   10/22/16 1338  BP:  (!) 152/84  Pulse: 80  Weight: 258 lb 6.4 oz (117.2 kg)  Height: 6\' 3"  (1.905 m)   Wt Readings from Last 3 Encounters:  10/22/16 258 lb 6.4 oz (117.2 kg)  10/21/16 259 lb (117.5 kg)  10/16/16 252 lb (114.3 kg)    Labs: Lab Results  Component Value Date   NA 140 10/16/2016   K 4.5 10/16/2016   CL 103 10/16/2016   CO2 27 10/16/2016   GLUCOSE 134 (H) 10/16/2016   BUN 17 10/16/2016   CREATININE 1.14 10/16/2016   CALCIUM 9.5 10/16/2016   PHOS 3.5 04/25/2015   MG 2.1 10/22/2015  Lab Results  Component Value Date   INR 2.52 10/16/2016   Lab Results  Component Value Date   CHOL 142 10/23/2015   HDL 42 10/23/2015   LDLCALC 75 10/23/2015   TRIG 127 10/23/2015     GEN- The patient is well appearing, alert and oriented x 3 today.   Head- normocephalic, atraumatic Eyes-  Sclera clear, conjunctiva pink Ears- hearing intact Oropharynx- clear Neck- supple, no JVP Lymph- no cervical lymphadenopathy Lungs- Clear to ausculation bilaterally, normal work of breathing Heart- Regular rate and rhythm, no murmurs, rubs or gallops, PMI not laterally displaced GI- soft, NT, ND, + BS Extremities- no clubbing, cyanosis, or edema MS- no significant deformity or atrophy Skin- no rash or lesion Psych- euthymic mood, full affect Neuro- strength and sensation are intact  EKG- NSR at 80 bpm, LAFB, PR int 164 ms, qrs itn 94 ms, qt c 422 ms Epic records reviewed    Assessment and Plan: 1. Shortness of breath Improved off amiodarone  Chest CT/PFT's f/u with pulmonologist pending  2. Afib Maintaining SR Will bring back in one month of amio level Could not consider possibly starting another antiarrythmic until amio is washed out, sometimes takes months Continue warfarin, will let coumadin clinic know that pt is stopping amiodarone as it may mean INR could fluctuate for a few weeks.  F/u here in 3-4 weeks  Pulmonology as scheduled Dr. Saunders Revel as scheduled  Geroge Baseman. Estephani Popper, [facility] [address] [place], [place] [address] [phone]

## 2016-11-04 ENCOUNTER — Ambulatory Visit (INDEPENDENT_AMBULATORY_CARE_PROVIDER_SITE_OTHER): Payer: Medicare Other

## 2016-11-04 ENCOUNTER — Ambulatory Visit (INDEPENDENT_AMBULATORY_CARE_PROVIDER_SITE_OTHER)
Admission: RE | Admit: 2016-11-04 | Discharge: 2016-11-04 | Disposition: A | Discharge status: Home / Self Care | Payer: Medicare Other | Source: Ambulatory Visit | Attending: Emergency Medicine | Admitting: Emergency Medicine

## 2016-11-04 DIAGNOSIS — J849 Interstitial pulmonary disease, unspecified: Secondary | ICD-10-CM

## 2016-11-04 DIAGNOSIS — Z7901 Long term (current) use of anticoagulants: Secondary | ICD-10-CM | POA: Diagnosis not present

## 2016-11-04 DIAGNOSIS — I25119 Atherosclerotic heart disease of native coronary artery with unspecified angina pectoris: Secondary | ICD-10-CM

## 2016-11-04 DIAGNOSIS — I509 Heart failure, unspecified: Secondary | ICD-10-CM | POA: Diagnosis not present

## 2016-11-04 LAB — POCT INR: INR: 2.9

## 2016-11-11 ENCOUNTER — Institutional Professional Consult (permissible substitution): Payer: Medicare Other | Admitting: Internal Medicine

## 2016-11-14 ENCOUNTER — Encounter: Payer: Self-pay | Admitting: *Deleted

## 2016-11-18 IMAGING — DX DG HIP (WITH OR WITHOUT PELVIS) 1V*L*
3 series · 3 of 3 positions shown · non-contrast
Comparison: None.

CLINICAL DATA: Pain

EXAM:
DG HIP (WITH OR WITHOUT PELVIS) 2-3V*L*

[hip ap]
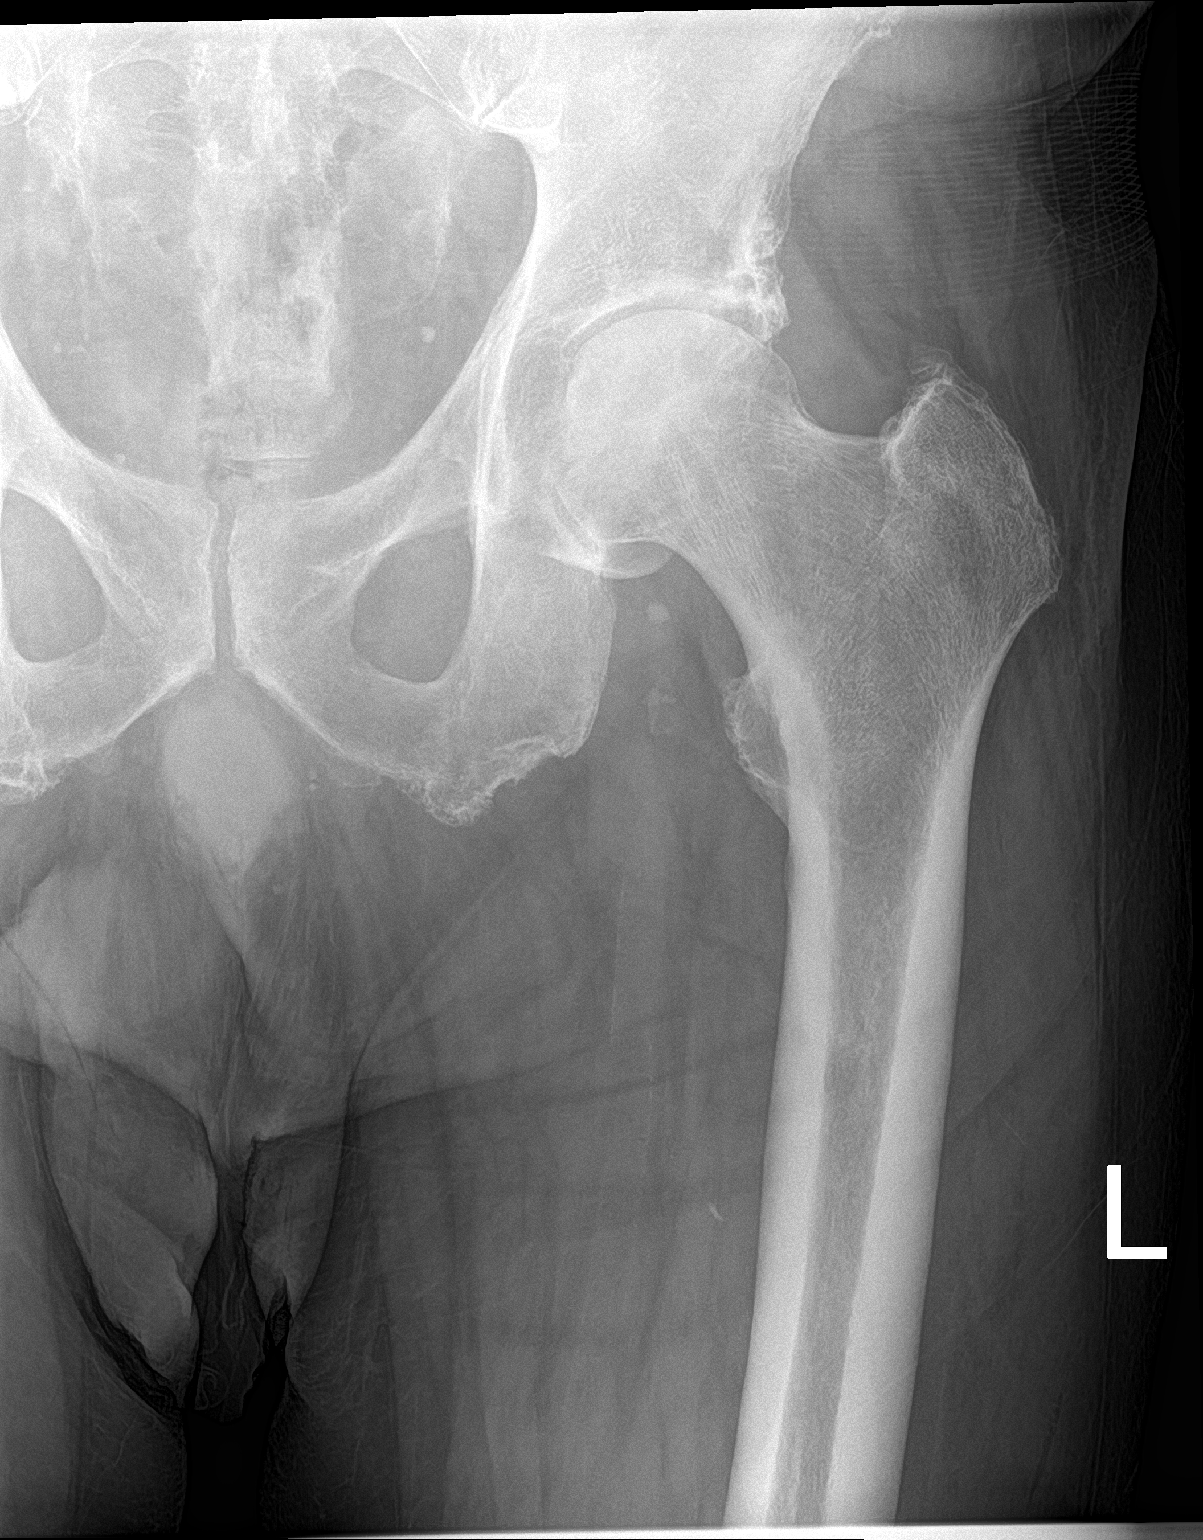

[hip lat]
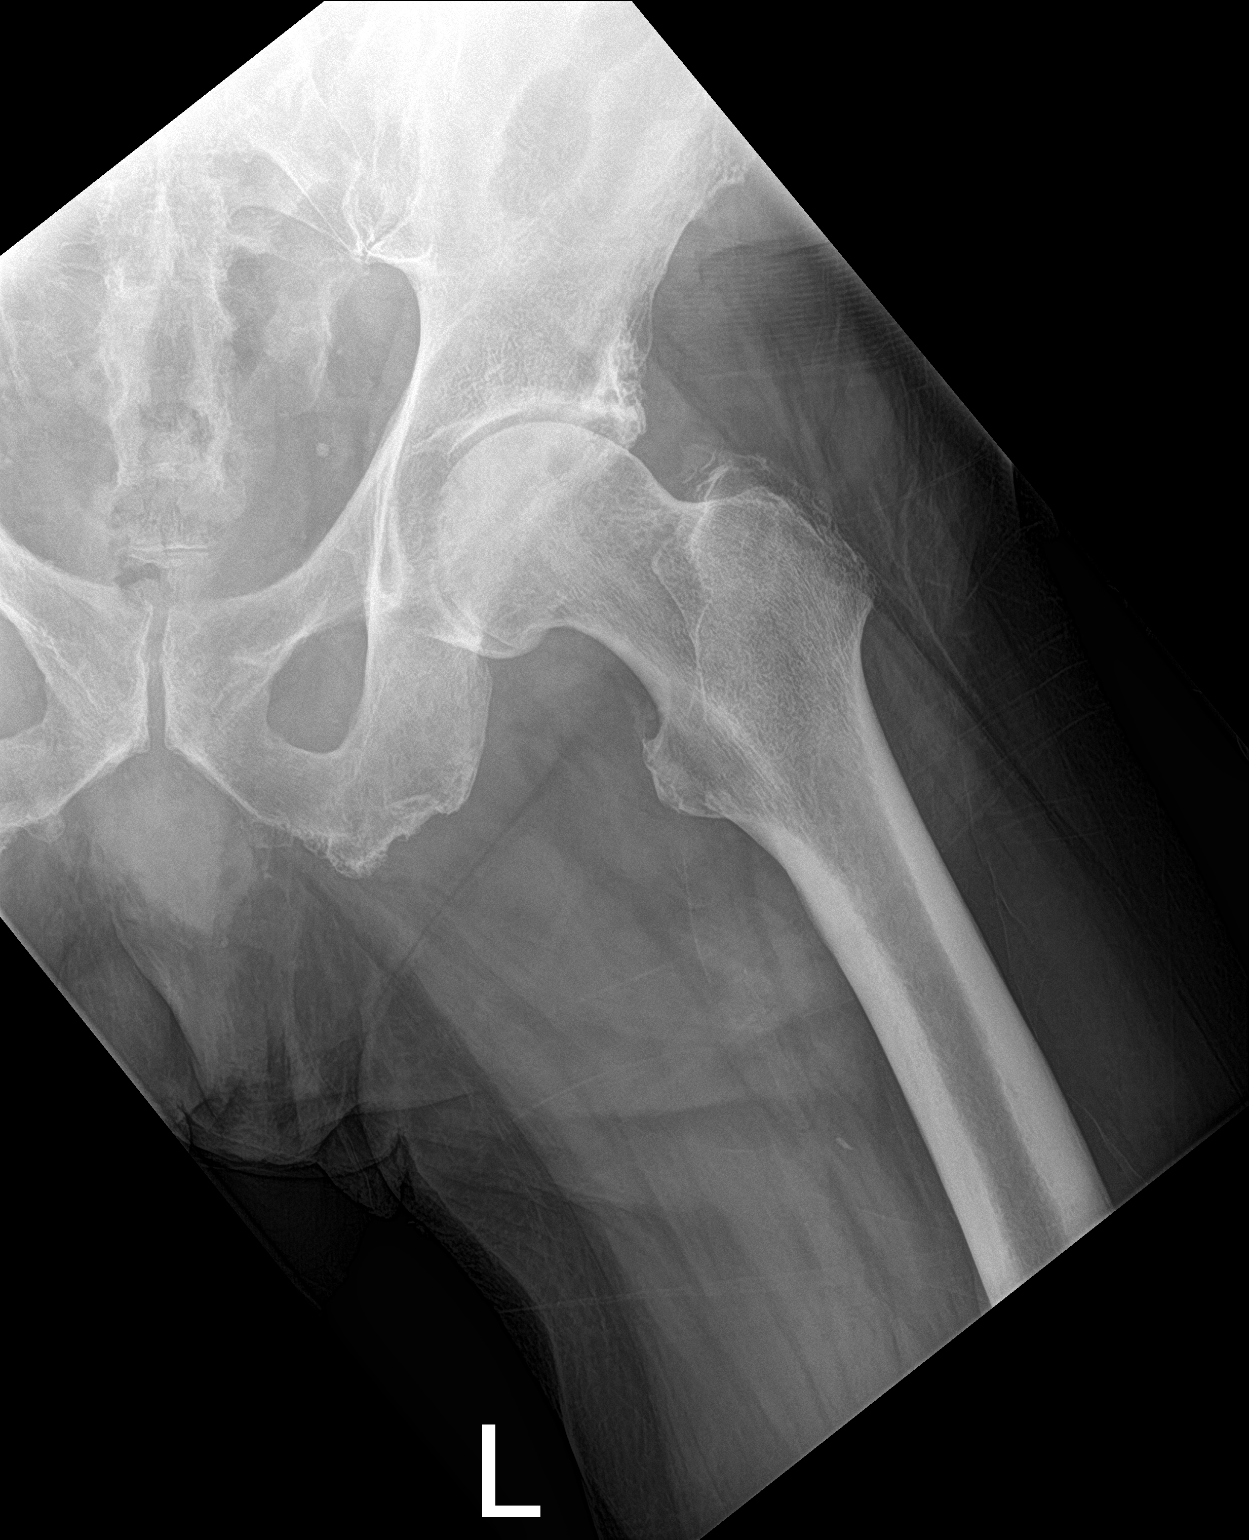

[pelvis ap]
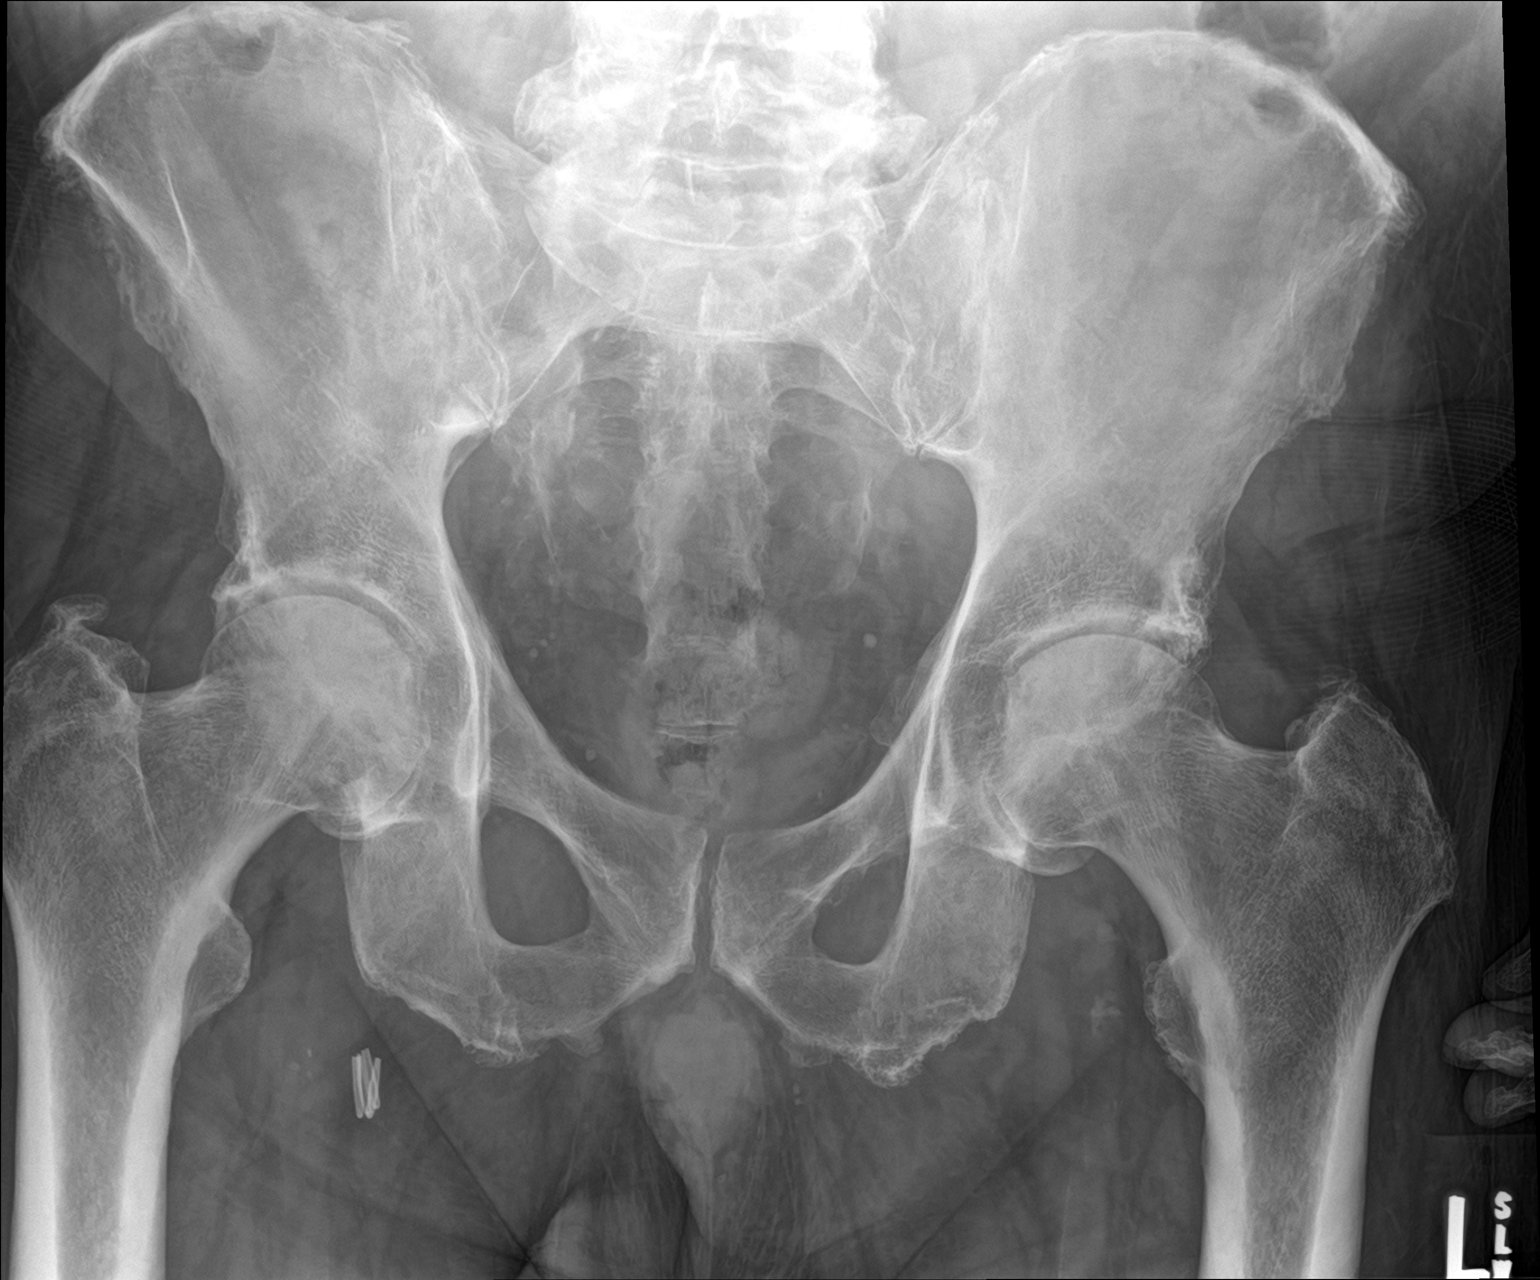

[3 of 3 positions shown; findings below may reference images not displayed]

FINDINGS: Frontal pelvis as well as frontal and lateral left hip images were
obtained. There is no acute fracture or dislocation. There is
symmetric osteoarthritic change in both hip joints. No erosive
change. Osteoarthritic changes also noted in each sacroiliac joint.
IMPRESSION: Symmetric narrowing both hip joints. Osteoarthritic change also
noted in the sacroiliac joints. No acute fracture or dislocation.

## 2016-11-21 IMAGING — MR MR LUMBAR SPINE W/O CM
4 of 7 series · 21 of 48 positions shown · non-contrast
Comparison: Plain films lumbar spine 12/05/2015.

CLINICAL DATA: Low back and left hip and leg pain since using a
riding lawnmower 2 weeks ago.

EXAM:
MRI LUMBAR SPINE WITHOUT CONTRAST
TECHNIQUE: Multiplanar, multisequence MR imaging of the lumbar spine was
performed. No intravenous contrast was administered.

[Series 2: T1 · sagittal · 4.0mm · 0.51mm/px · 3 of 15 slices shown]
[im 3/15]
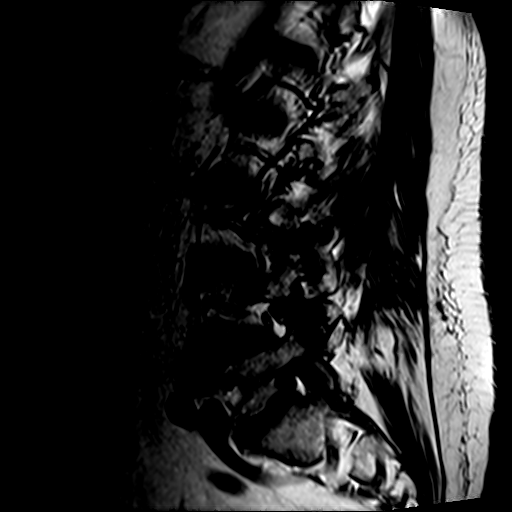
[im 9/15]
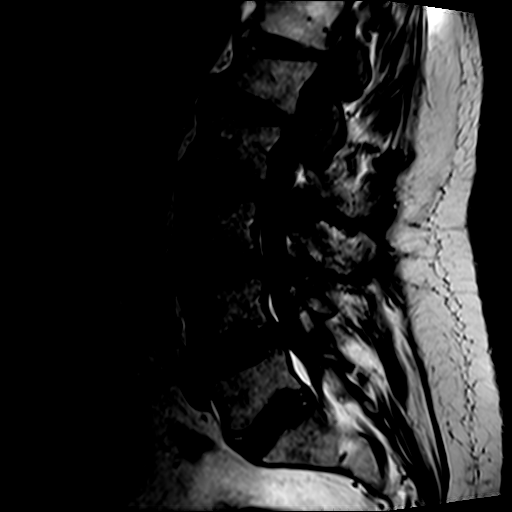
[im 15/15]
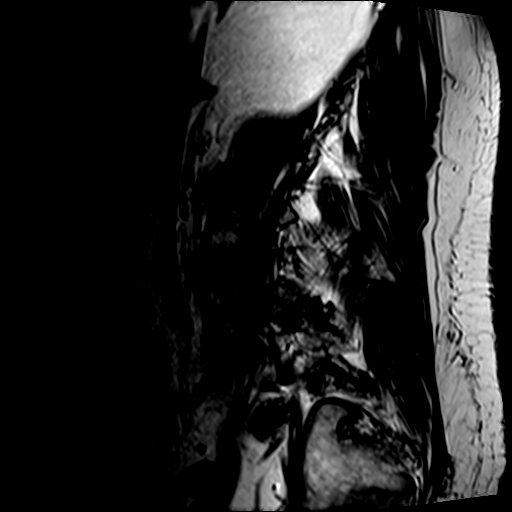

[Series 3: T2 · sagittal · 4.0mm · 0.81mm/px · 5 of 15 slices shown (1 of 3)]
[im 1/15]
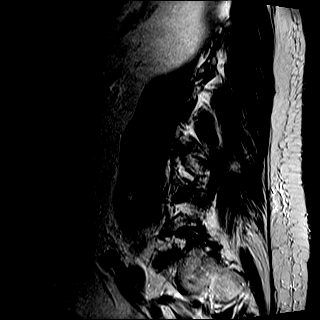
[im 4/15]
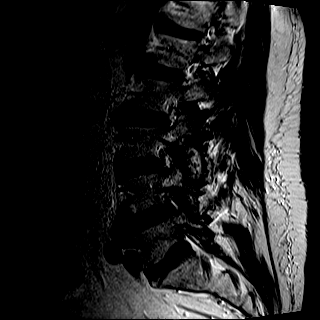
[im 8/15]
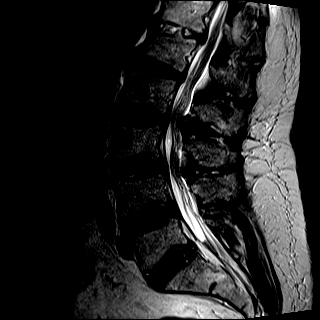
[im 11/15]
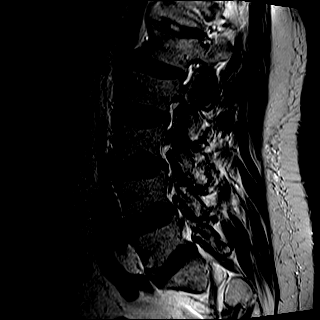
[im 15/15]
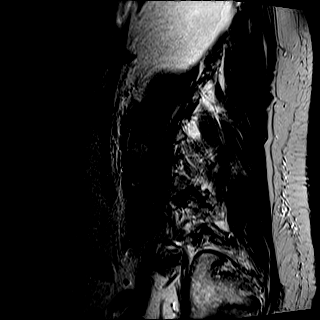

[Series 5: T2 · axial · 4.0mm · 0.39mm/px · z∈[+47,+144]mm · 7 of 21 slices shown (2 of 3)]
[im 1/21]
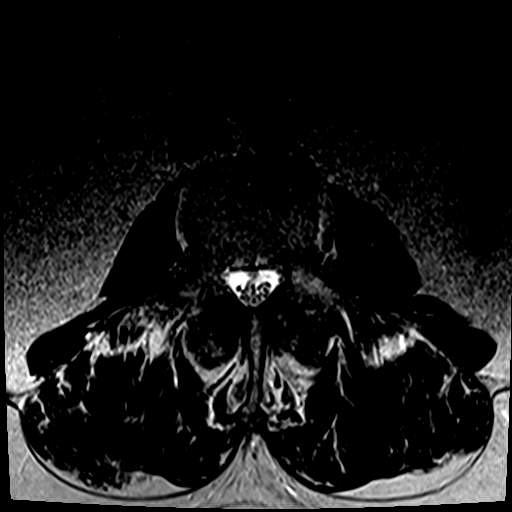
[im 4/21]
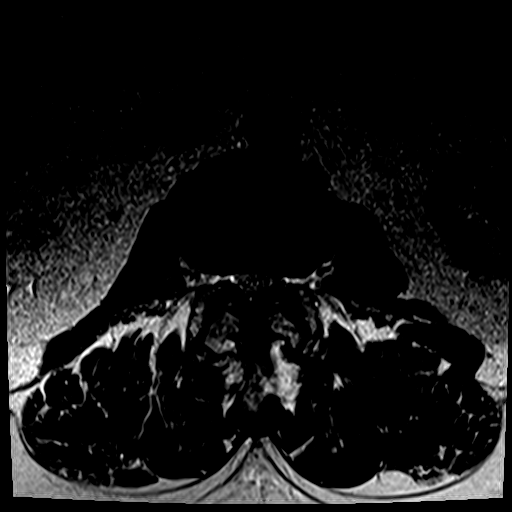
[im 7/21]
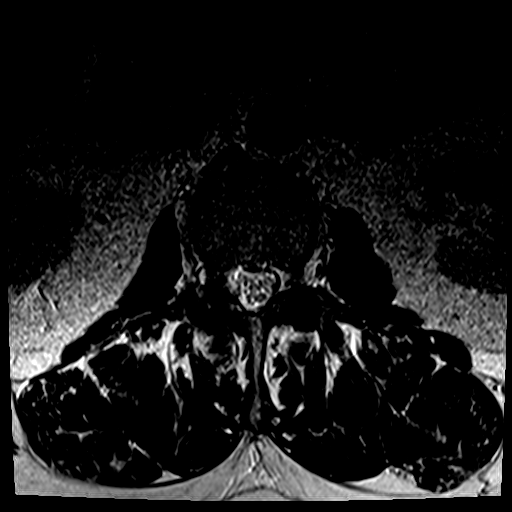
[im 11/21]
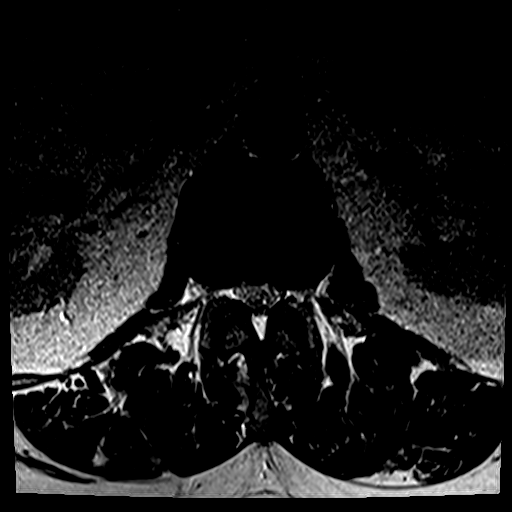
[im 14/21]
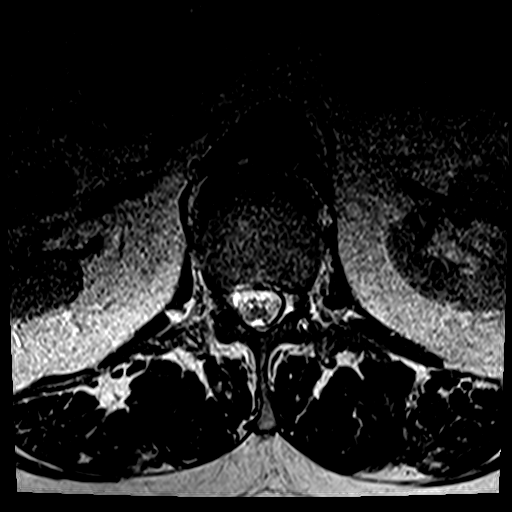
[im 17/21]
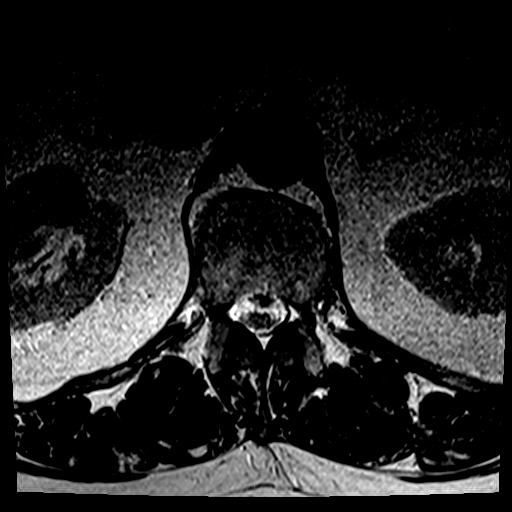
[im 21/21]
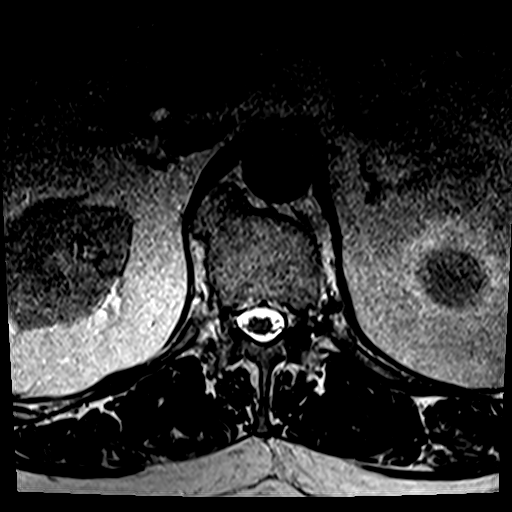

[Series 7: T2 · axial · 4.0mm · 0.39mm/px · z∈[-116,-22]mm · 6 of 24 slices shown (3 of 3)]
[im 1/24]
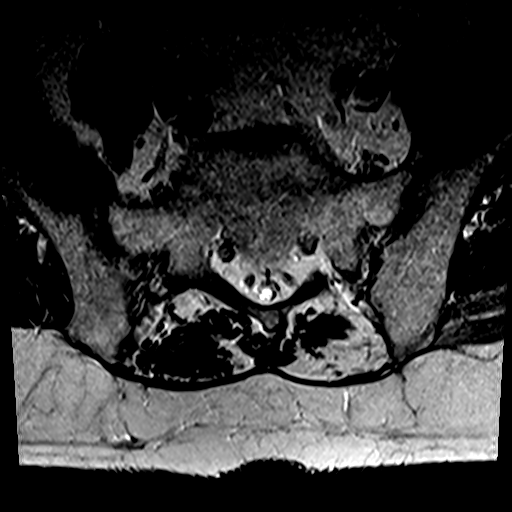
[im 3/24]
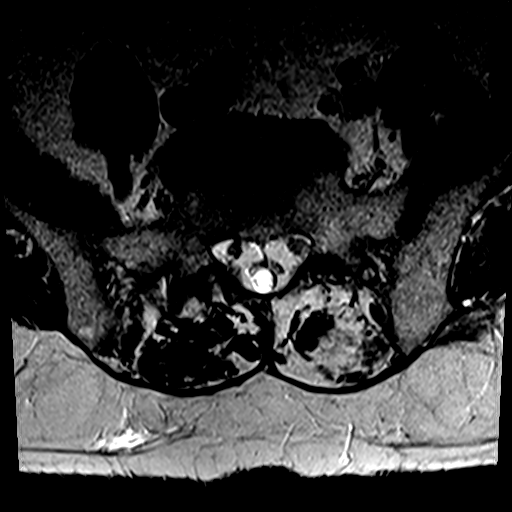
[im 6/24]
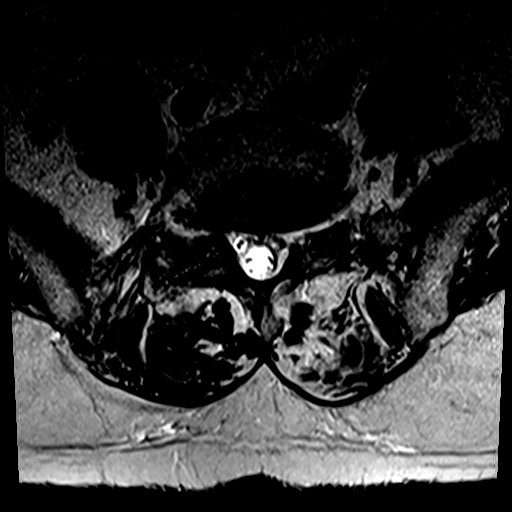
[im 9/24]
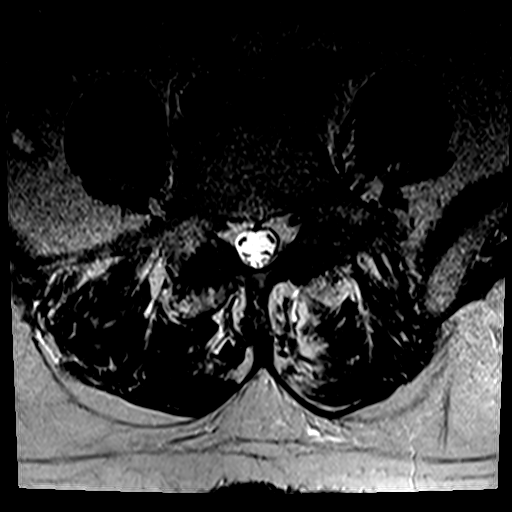
[im 12/24]
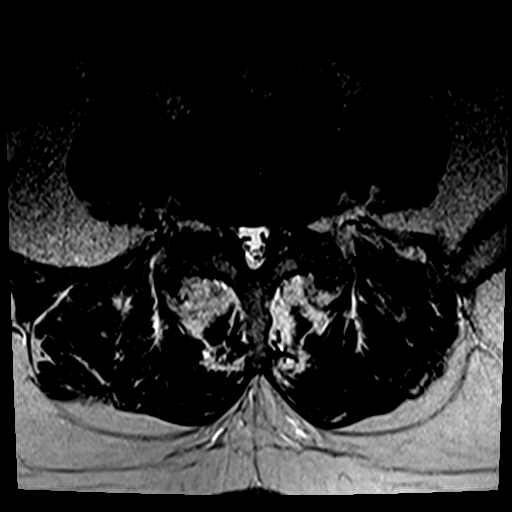
[im 21/24]
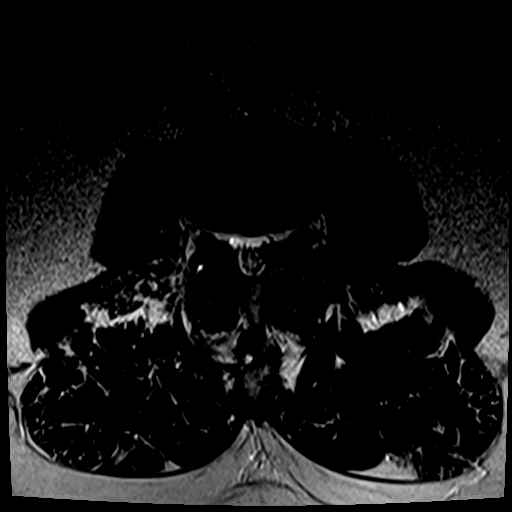

[21 of 48 positions shown; findings below may reference images not displayed]

FINDINGS: Segmentation:  Unremarkable.

Alignment:  Maintained with exaggeration of lumbar lordosis noted.

Vertebrae: Mild, remote anterior superior endplate compression
fracture of L1 is seen. Scattered Schmorl's nodes are noted. Marrow
signal is unremarkable.

Conus medullaris: Extends to the L1-2 level and appears normal.

Paraspinal and other soft tissues: Unremarkable.

Disc levels:

T12-L1: Shallow disc bulge and endplate spur to the left without
central canal or foraminal stenosis.

L1-2: Mild to moderate facet degenerative change and a minimal disc
bulge without central canal or foraminal stenosis.

L2-3: Moderate facet arthropathy, shallow disc bulge and ligamentum
flavum thickening are seen. There is moderate central canal
stenosis. The foramina are open.

L3-4: Advanced bilateral facet degenerative change and a shallow
disc bulge are seen. There is moderate to moderately severe central
canal stenosis and narrowing of the subarticular recesses, greater
on the right. The foramina are open.

L4-5: Advanced bilateral facet degenerative change and a shallow
disc bulge are identified. Mild central canal narrowing is present
with narrowing of the subarticular recesses but no nerve root
compression. The foramina are open.

L5-S1: Advanced bilateral facet degenerative disease. Minimal disc
bulge without central canal or foraminal stenosis.
IMPRESSION: Multilevel spondylosis. Central canal narrowing appears worst at
L3-4 where there is moderate to moderately severe central canal
stenosis with narrowing of both lateral recesses. No nerve root
compression is identified. Multilevel facet arthropathy is also
identified. Please see above for descriptions of individual levels.

Mild anterior and remote superior endplate compression fracture of
L1.

## 2016-11-24 ENCOUNTER — Ambulatory Visit (INDEPENDENT_AMBULATORY_CARE_PROVIDER_SITE_OTHER): Payer: Medicare Other | Admitting: *Deleted

## 2016-11-24 DIAGNOSIS — I4819 Other persistent atrial fibrillation: Secondary | ICD-10-CM

## 2016-11-24 DIAGNOSIS — Z7901 Long term (current) use of anticoagulants: Secondary | ICD-10-CM | POA: Diagnosis not present

## 2016-11-24 DIAGNOSIS — I481 Persistent atrial fibrillation: Secondary | ICD-10-CM

## 2016-11-24 LAB — POCT INR: INR: 2.4

## 2016-11-25 ENCOUNTER — Other Ambulatory Visit: Payer: Self-pay | Admitting: Physician Assistant

## 2016-11-26 ENCOUNTER — Ambulatory Visit (INDEPENDENT_AMBULATORY_CARE_PROVIDER_SITE_OTHER): Payer: Medicare Other | Admitting: Emergency Medicine

## 2016-11-26 ENCOUNTER — Other Ambulatory Visit: Payer: Self-pay

## 2016-11-26 ENCOUNTER — Ambulatory Visit (HOSPITAL_COMMUNITY)
Admission: RE | Admit: 2016-11-26 | Discharge: 2016-11-26 | Disposition: A | Discharge status: Home / Self Care | Payer: Medicare Other | Source: Ambulatory Visit | Attending: Nurse Practitioner | Admitting: Nurse Practitioner

## 2016-11-26 ENCOUNTER — Encounter (HOSPITAL_COMMUNITY): Payer: Self-pay | Admitting: Nurse Practitioner

## 2016-11-26 VITALS — BP 122/64 | HR 76 | Ht 75.0 in | Wt 260.8 lb

## 2016-11-26 DIAGNOSIS — E039 Hypothyroidism, unspecified: Secondary | ICD-10-CM | POA: Diagnosis not present

## 2016-11-26 DIAGNOSIS — I481 Persistent atrial fibrillation: Secondary | ICD-10-CM | POA: Insufficient documentation

## 2016-11-26 DIAGNOSIS — Z951 Presence of aortocoronary bypass graft: Secondary | ICD-10-CM | POA: Insufficient documentation

## 2016-11-26 DIAGNOSIS — I2510 Atherosclerotic heart disease of native coronary artery without angina pectoris: Secondary | ICD-10-CM | POA: Insufficient documentation

## 2016-11-26 DIAGNOSIS — R0602 Shortness of breath: Secondary | ICD-10-CM | POA: Insufficient documentation

## 2016-11-26 DIAGNOSIS — Z7901 Long term (current) use of anticoagulants: Secondary | ICD-10-CM | POA: Diagnosis not present

## 2016-11-26 DIAGNOSIS — M109 Gout, unspecified: Secondary | ICD-10-CM | POA: Insufficient documentation

## 2016-11-26 DIAGNOSIS — Z7984 Long term (current) use of oral hypoglycemic drugs: Secondary | ICD-10-CM | POA: Insufficient documentation

## 2016-11-26 DIAGNOSIS — Z8673 Personal history of transient ischemic attack (TIA), and cerebral infarction without residual deficits: Secondary | ICD-10-CM | POA: Diagnosis not present

## 2016-11-26 DIAGNOSIS — K219 Gastro-esophageal reflux disease without esophagitis: Secondary | ICD-10-CM | POA: Insufficient documentation

## 2016-11-26 DIAGNOSIS — J849 Interstitial pulmonary disease, unspecified: Secondary | ICD-10-CM

## 2016-11-26 DIAGNOSIS — E785 Hyperlipidemia, unspecified: Secondary | ICD-10-CM | POA: Diagnosis not present

## 2016-11-26 DIAGNOSIS — Z79899 Other long term (current) drug therapy: Secondary | ICD-10-CM | POA: Insufficient documentation

## 2016-11-26 DIAGNOSIS — I110 Hypertensive heart disease with heart failure: Secondary | ICD-10-CM | POA: Insufficient documentation

## 2016-11-26 DIAGNOSIS — I480 Paroxysmal atrial fibrillation: Secondary | ICD-10-CM

## 2016-11-26 DIAGNOSIS — I5032 Chronic diastolic (congestive) heart failure: Secondary | ICD-10-CM | POA: Diagnosis not present

## 2016-11-26 DIAGNOSIS — G4733 Obstructive sleep apnea (adult) (pediatric): Secondary | ICD-10-CM | POA: Diagnosis not present

## 2016-11-26 DIAGNOSIS — I4819 Other persistent atrial fibrillation: Secondary | ICD-10-CM

## 2016-11-26 DIAGNOSIS — I4891 Unspecified atrial fibrillation: Secondary | ICD-10-CM | POA: Diagnosis present

## 2016-11-26 LAB — PULMONARY FUNCTION TEST
DL/VA % pred: 136 %
DL/VA: 6.62 ml/min/mmHg/L
DLCO cor % pred: 60 %
DLCO cor: 23.77 ml/min/mmHg
DLCO unc % pred: 55 %
DLCO unc: 21.56 ml/min/mmHg
FEF 25-75 Post: 2.15 L/sec
FEF 25-75 Pre: 2.17 L/sec
FEF2575-%Change-Post: 0 %
FEF2575-%Pred-Post: 77 %
FEF2575-%Pred-Pre: 77 %
FEV1-%Change-Post: -1 %
FEV1-%Pred-Post: 54 %
FEV1-%Pred-Pre: 55 %
FEV1-Post: 2.07 L
FEV1-Pre: 2.1 L
FEV1FVC-%Change-Post: 4 %
FEV1FVC-%Pred-Pre: 110 %
FEV6-%Change-Post: -5 %
FEV6-%Pred-Post: 50 %
FEV6-%Pred-Pre: 52 %
FEV6-Post: 2.45 L
FEV6-Pre: 2.59 L
FEV6FVC-%Change-Post: 0 %
FEV6FVC-%Pred-Post: 104 %
FEV6FVC-%Pred-Pre: 104 %
FVC-%Change-Post: -5 %
FVC-%Pred-Post: 47 %
FVC-%Pred-Pre: 50 %
FVC-Post: 2.46 L
FVC-Pre: 2.6 L
Post FEV1/FVC ratio: 84 %
Post FEV6/FVC ratio: 100 %
Pre FEV1/FVC ratio: 81 %
Pre FEV6/FVC Ratio: 100 %
RV % pred: 61 %
RV: 1.71 L
TLC % pred: 47 %
TLC: 3.85 L

## 2016-11-26 NOTE — Progress Notes (Signed)
Primary Care Physician: [name] Alar, NP Referring Physician:Dr. Cathie [name] Cardiologist: Dr. Terri [name] is a 71 y.o. male with a h/o CAD, prior CVA, HF, HTN, OSA on cpap for OSA in the afib clinc for f/u from recent ER visit.Marland Kitchen He has h/o afib, maintaining SR on amiodarone since 2026. He has developed recent shortness of breath for which he presented to the ER. It was decided that amiodarone may be the etiology for shortness of breath and stopped.   He states that his shortness of breath is better off amiodarone now less one week. He is to f/u with pulmonologist and chest CT is pending. CXR did not show ILD. He is maintaining SR.  F/u in afib clinic, 9/12, pt has been off amiodarone since 8/2, for concerns of amiodarone causing breathing issues. Initially, he thought that breathing was improved but now he says he can't tell improvement. He had chest CT that did not show evidence for interstial disease.He continues in NSR.  Today, he denies symptoms of palpitations, chest pain, , orthopnea, PND, lower extremity edema, dizziness, presyncope, syncope, or neurologic sequela. Positive for shortness of breath, improved. The patient is tolerating medications without difficulties and is otherwise without complaint today.   Past Medical History:  Diagnosis Date  . CAD (coronary artery disease)    s/p CABG, s/p DES to LCX  January 2011  . Chronic pain    left sided-[name]  . CVA (cerebral infarction)    left hemiparesis  . Diastolic CHF ([place])   . Dysrhythmia    hx of atrial fibrilation with cardioversion  . GERD (gastroesophageal reflux disease)   . Gout   . Hyperlipidemia   . Hypertension   . Hypothyroidism   . OSA on CPAP   . Sleep apnea   . Stroke ([name]) 1996  . Tubular adenoma of colon 05/2010   Past Surgical History:  Procedure Laterality Date  . CARDIAC CATHETERIZATION N/A 10/25/2015   Procedure: Left Heart Cath and Cors/Grafts Angiography;  Surgeon: [name] Sine, MD;  Location: [place] [facility];  Service: Cardiovascular;  Laterality: N/A;  . CARDIOVERSION  06/20/2011   Procedure: CARDIOVERSION;  Surgeon: [name] Dresser, MD;  Location: [facility] ENDOSCOPY;  Service: Cardiovascular;  Laterality: N/A;  . CARDIOVERSION N/A 04/26/2015   Procedure: CARDIOVERSION;  Surgeon: [name] Spark, MD;  Location: [facility] ENDOSCOPY;  Service: Cardiovascular;  Laterality: N/A;  . CARDIOVERSION N/A 05/10/2015   Procedure: CARDIOVERSION;  Surgeon: [name] Dresser, MD;  Location: [place];  Service: Cardiovascular;  Laterality: N/A;  . CATARACT EXTRACTION  05/2010   left eye  . CATARACT EXTRACTION  04/2010   right eye  . CORONARY ARTERY BYPASS GRAFT     stent  . ESOPHAGOGASTRODUODENOSCOPY  03-25-2005  . NASAL SEPTUM SURGERY    . PERCUTANEOUS PLACEMENT INTRAVASCULAR STENT CERVICAL CAROTID ARTERY     03-2009; using a drug-eluting platform of the circumflex cornoray artery with a 3.0 x 18 [employer] Promus drug-eluting platform post dilated to 3.75 with a noncompliant balloon.  . TEE WITHOUT CARDIOVERSION  06/20/2011   Procedure: TRANSESOPHAGEAL ECHOCARDIOGRAM (TEE);  Surgeon: [name] Dresser, MD;  Location: [facility] ENDOSCOPY;  Service: Cardiovascular;  Laterality: N/A;    Current Outpatient Prescriptions  Medication Sig Dispense Refill  . albuterol (PROVENTIL HFA;VENTOLIN HFA) 108 (90 Base) MCG/ACT inhaler Inhale into the lungs every 6 (six) hours as needed for wheezing or shortness of breath.    . allopurinol (ZYLOPRIM) 100 MG tablet TAKE  2 TABLETS BY MOUTH DAILY. D/C PREVIOUS SCRIPTS FOR THIS MEDICATION 180 tablet 1  . atorvastatin (LIPITOR) 40 MG tablet TAKE 2 TABLETS (80 MG TOTAL) BY MOUTH DAILY. 180 tablet 3  . benazepril (LOTENSIN) 10 MG tablet Take 1 tablet (10 mg total) by mouth daily. 135 tablet 1  . benazepril (LOTENSIN) 5 MG tablet Take 1 tablet (5 mg total) by mouth daily. Take 1 tablet daily with the 10 mg tablet of Benazepril = 15 mg daily 90 tablet 3  .  fluticasone (FLONASE) 50 MCG/ACT nasal spray Place 2 sprays into both nostrils daily. 9.9 g 1  . furosemide (LASIX) 40 MG tablet Take 1 tablet (40 mg total) by mouth every other day. 45 tablet 1  . isosorbide mononitrate (IMDUR) 30 MG 24 hr tablet Take 0.5 tablets (15 mg total) by mouth daily. 30 tablet 6  . lactulose (CHRONULAC) 10 GM/15ML solution Take 15 mLs (10 g total) by mouth daily as needed for mild constipation. 240 mL 1  . levothyroxine (SYNTHROID, LEVOTHROID) 112 MCG tablet Take 1 tablet (112 mcg total) by mouth daily before breakfast. 90 tablet 0  . metFORMIN (GLUCOPHAGE) 500 MG tablet Take 1 tablet (500 mg total) by mouth daily with breakfast. 90 tablet 1  . Multiple Vitamins-Minerals (MULTIVITAMIN GUMMIES MENS PO) Take 1 tablet by mouth daily.    . nitroGLYCERIN (NITROSTAT) 0.4 MG SL tablet Place 1 tablet (0.4 mg total) under the tongue every 5 (five) minutes as needed for chest pain. Up to 3 doses 10 tablet 2  . pantoprazole (PROTONIX) 40 MG tablet Take 1 tablet (40 mg total) by mouth daily. 90 tablet 0  . traMADol (ULTRAM) 50 MG tablet Take 1 tablet (50 mg total) by mouth 2 (two) times daily. 180 tablet 0  . traMADol (ULTRAM-ER) 200 MG 24 hr tablet Take 200 mg by mouth 2 (two) times daily.    . vitamin B-12 1000 MCG tablet Take 1 tablet (1,000 mcg total) by mouth daily. 30 tablet 0  . warfarin (COUMADIN) 2.5 MG tablet Take 1/2 tablet daily except 1 tablet on Monday, Wednesday, and Friday or as directed by Coumadin Clinic 70 tablet 0   No current facility-administered medications for this encounter.     No Known Allergies  Social History   Social History  . Marital status: Married    Spouse name: [name]  . Number of children: N/A  . Years of education: 17   Occupational History  . retired Hydrographic surveyor   Social History Main Topics  . Smoking status: Never Smoker  . Smokeless tobacco: Never Used  . Alcohol use No     Comment: 1 beer a month  . Drug use:  No  . Sexual activity: No   Other Topics Concern  . Not on file   Social History Narrative   Pt lives with wife. Does have stairs, but patient doesn't use them. Pt has completed technical school    Family History  Problem Relation Age of Onset  . Lung cancer Father        deceased  . Stroke Mother        deceased-[place]ES    ROS- All systems are reviewed and negative except as per the HPI above  Physical Exam: Vitals:   11/26/16 1121  BP: 122/64  Pulse: 76  Weight: 260 lb 12.8 oz (118.3 kg)  Height: 6\' 3"  (1.905 m)   Wt Readings from Last 3 Encounters:  11/26/16 260 lb  12.8 oz (118.3 kg)  10/22/16 258 lb 6.4 oz (117.2 kg)  10/21/16 259 lb (117.5 kg)    Labs: Lab Results  Component Value Date   NA 140 10/16/2016   K 4.5 10/16/2016   CL 103 10/16/2016   CO2 27 10/16/2016   GLUCOSE 134 (H) 10/16/2016   BUN 17 10/16/2016   CREATININE 1.14 10/16/2016   CALCIUM 9.5 10/16/2016   PHOS 3.5 04/25/2015   MG 2.1 10/22/2015   Lab Results  Component Value Date   INR 2.4 11/24/2016   Lab Results  Component Value Date   CHOL 142 10/23/2015   HDL 42 10/23/2015   LDLCALC 75 10/23/2015   TRIG 127 10/23/2015     GEN- The patient is well appearing, alert and oriented x 3 today.   Head- normocephalic, atraumatic Eyes-  Sclera clear, conjunctiva pink Ears- hearing intact Oropharynx- clear Neck- supple, no JVP Lymph- no cervical lymphadenopathy Lungs- Clear to ausculation bilaterally, normal work of breathing Heart- Regular rate and rhythm, no murmurs, rubs or gallops, PMI not laterally displaced GI- soft, NT, ND, + BS Extremities- no clubbing, cyanosis, or edema MS- no significant deformity or atrophy Skin- no rash or lesion Psych- euthymic mood, full affect Neuro- strength and sensation are intact  EKG- NSR with pac's at 76 bpm, LAFB, PR int 164 ms, qrs itn 92 ms, qt c 436 ms  Epic records reviewed  Chest CT-IMPRESSION: 1. No evidence of interstitial  lung disease. 2. 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  Aortic Atherosclerosis (ICD10-I70.0).    Assessment and Plan: 1. Shortness of breath Improved off amiodarone initially, now pt thinks he cannot tell a difference Chest CT, no intestinal disease, has f/u with Dr. Lamonte Sakai, 9/25  2. Afib Maintaining SR Amio  Level today Could not consider possibly starting another antiarrythmic until amio is washed out, sometimes takes months Continue warfarin, coumadin clinic is aware that pt stopped amiodarone as it may mean INR could fluctuate for a few weeks.  Dr. Saunders Revel as scheduled F/u per amio level  [name], [facility] [address] [place], [place] [address] [phone]

## 2016-11-26 NOTE — Telephone Encounter (Signed)
Please keep upcoming appointment for further refills .

## 2016-11-26 NOTE — Progress Notes (Signed)
PFT done today. 

## 2016-11-27 LAB — AMIODARONE LEVEL
Amiodarone Lvl: 0.3 ug/mL — ABNORMAL LOW (ref 1.0–2.5)
N-Desethyl-Amiodarone: 0.4 ug/mL — ABNORMAL LOW (ref 1.0–2.5)

## 2016-11-30 ENCOUNTER — Other Ambulatory Visit: Payer: Self-pay | Admitting: Cardiology

## 2016-12-05 ENCOUNTER — Encounter: Payer: Self-pay | Admitting: Internal Medicine

## 2016-12-05 ENCOUNTER — Ambulatory Visit (INDEPENDENT_AMBULATORY_CARE_PROVIDER_SITE_OTHER): Payer: Medicare Other | Admitting: Internal Medicine

## 2016-12-05 VITALS — BP 120/70 | HR 78 | Ht 75.0 in | Wt 257.8 lb

## 2016-12-05 DIAGNOSIS — E785 Hyperlipidemia, unspecified: Secondary | ICD-10-CM

## 2016-12-05 DIAGNOSIS — I5032 Chronic diastolic (congestive) heart failure: Secondary | ICD-10-CM

## 2016-12-05 DIAGNOSIS — I480 Paroxysmal atrial fibrillation: Secondary | ICD-10-CM

## 2016-12-05 DIAGNOSIS — I2510 Atherosclerotic heart disease of native coronary artery without angina pectoris: Secondary | ICD-10-CM | POA: Diagnosis not present

## 2016-12-05 DIAGNOSIS — I25118 Atherosclerotic heart disease of native coronary artery with other forms of angina pectoris: Secondary | ICD-10-CM

## 2016-12-05 DIAGNOSIS — I2583 Coronary atherosclerosis due to lipid rich plaque: Secondary | ICD-10-CM | POA: Diagnosis not present

## 2016-12-05 LAB — LIPID PANEL
CHOLESTEROL TOTAL: 138 mg/dL (ref 100–199)
Chol/HDL Ratio: 3 ratio (ref 0.0–5.0)
HDL: 46 mg/dL (ref >39)
LDL Calculated: 67 mg/dL (ref 0–99)
TRIGLYCERIDES: 123 mg/dL (ref 0–149)
VLDL Cholesterol Cal: 25 mg/dL (ref 5–40)

## 2016-12-05 NOTE — Progress Notes (Signed)
Follow-up Outpatient Visit Date: 12/05/2016  Primary Care Provider: Debbrah Alar, NP [address] [address] [place] [id number]  Chief Complaint: Follow-up high blood pressure and shortness of breath  HPI:  Mr. Rosete is a 71 y.o. year-old male with history of coronary artery disease status post CABG, chronic diastolic heart failure, paroxysmal atrial fibrillation, stroke, hypertension, hyperlipidemia, and obstructive sleep apnea, who presents for follow-up of stable angina, atrial fibrillation, and hypertension. I last saw Mr. Popoff in early July, at which time he was doing well except for mildly elevated blood pressure; he did not wish to make any medication changes at that time.  Mr. House presented to the [facility] ED in early August with increased shortness of breath. It was felt that his dyspnea could have been related to long-term amiodarone use. Amio was stopped, and Mr. Ottley has subsequently followed up with the a-fib clinic and Dr. Lamonte Sakai (pulmonary). He was given an albuterol inhaler, which has helped his breathing.  Today, Mr. Budney reports feeling well. He denies shortness of breath, palpitations, chest pain, and lightheadedness. He notes chronic left leg swelling ever since his stroke, for which he wears a compression stocking. He notes increasing numbness in his feet and is concerned about neuropathy. He has not had any falls.  Mr. Millstein home blood pressures have been around 120/60. He is tolerating his current medication regimen well.  --------------------------------------------------------------------------------------------------  Cardiovascular History & Procedures: Cardiovascular Problems:  Coronary artery disease  Paroxysmal atrial fibrillation  Chronic diastolic heart failure  Stroke with residual left-sided weakness  Risk Factors:  Known CAD, stroke, hypertension, and hyperlipidemia  Cath/PCI:  LHC (10/25/15): LMCA normal. LAD  with 90% ostial stenosis with competitive flow in the midvessel. D1 ostially occluded. LCx with 95% ostial stenosis. 30% mid LCx ISR. Ostially occluded OM2. Small, non-dominant RCA that is chronically occluded. LIMA->LAD patent with 95% stenosis in the apical LAD distal to the anastomosis. Patent SVG Y-graft to D1 and OM2. SVG->PDA known to be occluded.  CV Surgery:  CABG (LIMA->LAD, SVG Y-graft ->D1 and OM2, and SVG->PDA)  EP Procedures and Devices:  None  Non-Invasive Evaluation(s):  TTE (04/26/15): Normal LV size with mild LVH. LVEF 50-55% with normal wall motion and diastolic function. Mildly thickened aortic valve. Mild left atrial enlargement. Moderate TR. Normal RV size and function.  Recent CV Pertinent Labs: Lab Results  Component Value Date   CHOL 142 10/23/2015   HDL 42 10/23/2015   LDLCALC 75 10/23/2015   TRIG 127 10/23/2015   CHOLHDL 3.4 10/23/2015   INR 2.4 11/24/2016   INR 2.52 10/16/2016   BNP 88.7 10/16/2016   BNP 82.2 11/05/2015   K 4.5 10/16/2016   MG 2.1 10/22/2015   BUN 17 10/16/2016   BUN 14 09/18/2016   CREATININE 1.14 10/16/2016   CREATININE 1.27 (H) 11/05/2015    Past medical and surgical history were reviewed and updated in EPIC.  Current Meds  Medication Sig  . albuterol (PROVENTIL HFA;VENTOLIN HFA) 108 (90 Base) MCG/ACT inhaler Inhale into the lungs every 6 (six) hours as needed for wheezing or shortness of breath.  . allopurinol (ZYLOPRIM) 100 MG tablet TAKE 2 TABLETS BY MOUTH DAILY. D/C PREVIOUS SCRIPTS FOR THIS MEDICATION  . atorvastatin (LIPITOR) 40 MG tablet TAKE 2 TABLETS (80 MG TOTAL) BY MOUTH DAILY.  . benazepril (LOTENSIN) 10 MG tablet Take 1 tablet (10 mg total) by mouth daily.  . benazepril (LOTENSIN) 5 MG tablet Take 1 tablet (5 mg total)  by mouth daily. Take 1 tablet daily with the 10 mg tablet of Benazepril = 15 mg daily  . fluticasone (FLONASE) 50 MCG/ACT nasal spray Place 2 sprays into both nostrils daily.  . furosemide (LASIX)  40 MG tablet Take 1 tablet (40 mg total) by mouth every other day.  . isosorbide mononitrate (IMDUR) 30 MG 24 hr tablet Take 0.5 tablets (15 mg total) by mouth daily.  . isosorbide mononitrate (IMDUR) 30 MG 24 hr tablet TAKE 1 TABLET (30 MG TOTAL) BY MOUTH DAILY.  Marland Kitchen lactulose (CHRONULAC) 10 GM/15ML solution Take 15 mLs (10 g total) by mouth daily as needed for mild constipation.  Marland Kitchen levothyroxine (SYNTHROID, LEVOTHROID) 112 MCG tablet Take 1 tablet (112 mcg total) by mouth daily before breakfast.  . metFORMIN (GLUCOPHAGE) 500 MG tablet Take 1 tablet (500 mg total) by mouth daily with breakfast.  . Multiple Vitamins-Minerals (MULTIVITAMIN GUMMIES MENS PO) Take 1 tablet by mouth daily.  . nitroGLYCERIN (NITROSTAT) 0.4 MG SL tablet Place 1 tablet (0.4 mg total) under the tongue every 5 (five) minutes as needed for chest pain. Up to 3 doses  . pantoprazole (PROTONIX) 40 MG tablet Take 1 tablet (40 mg total) by mouth daily.  . traMADol (ULTRAM) 50 MG tablet Take 1 tablet (50 mg total) by mouth 2 (two) times daily.  . traMADol (ULTRAM-ER) 200 MG 24 hr tablet Take 200 mg by mouth 2 (two) times daily.  . vitamin B-12 1000 MCG tablet Take 1 tablet (1,000 mcg total) by mouth daily.  Marland Kitchen warfarin (COUMADIN) 2.5 MG tablet TAKE AS DIRECTED BY ANTICOAGULATION CLINIC    Allergies: Patient has no known allergies.  Social History   Social History  . Marital status: Married    Spouse name: [name]  . Number of children: N/A  . Years of education: 64   Occupational History  . retired Hydrographic surveyor   Social History Main Topics  . Smoking status: Never Smoker  . Smokeless tobacco: Never Used  . Alcohol use No     Comment: 1 beer a month  . Drug use: No  . Sexual activity: No   Other Topics Concern  . Not on file   Social History Narrative   Pt lives with wife. Does have stairs, but patient doesn't use them. Pt has completed technical school    Family History  Problem Relation Age of  Onset  . Lung cancer Father        deceased  . Stroke Mother        deceased-[place]ES    Review of Systems: A 12-system review of systems was performed and was negative except as noted in the HPI.  --------------------------------------------------------------------------------------------------  Physical Exam: BP 120/70   Pulse 78   Ht 6\' 3"  (1.905 m)   Wt 257 lb 12.8 oz (116.9 kg)   SpO2 93%   BMI 32.22 kg/m   General:  Obese man seated comfortably in the room. HEENT: No conjunctival pallor or scleral icterus.  Moist mucous membranes.  OP clear. Neck: Supple without lymphadenopathy, thyromegaly, JVD, or HJR Lungs: Normal work of breathing.  Mildly diminished breath sounds throughout without wheezes or crackles. Heart: Regular rate and rhythm with occasional extrasystoles. No murmurs or rubs. Abd: Bowel sounds present.  Soft, NT/ND without hepatosplenomegaly Ext: Trace left pretibial edema with compression stocking in place. Radial, PT, and DP pulses are 2+ bilaterally. Skin: Warm and dry without rash.  EKG: NSR with PACs and LVH.   Lab  Results  Component Value Date   WBC 5.2 10/16/2016   HGB 13.5 10/16/2016   HCT 42.3 10/16/2016   MCV 90.2 10/16/2016   PLT 180 10/16/2016    Lab Results  Component Value Date   NA 140 10/16/2016   K 4.5 10/16/2016   CL 103 10/16/2016   CO2 27 10/16/2016   BUN 17 10/16/2016   CREATININE 1.14 10/16/2016   GLUCOSE 134 (H) 10/16/2016   ALT 26 09/18/2016    Lab Results  Component Value Date   CHOL 142 10/23/2015   HDL 42 10/23/2015   LDLCALC 75 10/23/2015   TRIG 127 10/23/2015   CHOLHDL 3.4 10/23/2015    --------------------------------------------------------------------------------------------------  ASSESSMENT AND PLAN: Coronary artery disease with stable angina No chest pain. Recent episode of shortness of breath is likely multifactorial; I doubt it reflects worsening coronary insufficiency, particularly since it  has improved with discontinuation of amiodarone and treatment of underlying obstructive lung disease. Continue current medical regimen for treatment of stable angina.  Chronic diastolic heart failure Except for chronic left leg edema, Mr. Pettey appears euvolemic today. We will not make any medication changes at this time.  Essential hypertension Blood pressure is well-controlled at home and in the office today. We will continue his current medication regimen.  Paroxysmal atrial fibrillation EKG today demonstrates sinus rhythm with PACs. Amiodarone was discontinued after our last visit due to concern for potential lung toxicity. We will continue with indefinite anticoagulation but defer adding an antiarrhythmic agent at this time. Mr. Simpson should continue to follow-up in the a-fib clinic as well as with Dr. Lamonte Sakai.  Hyperlipidemia We will recheck a lipid panel today to ensure that LDL remains at goal. Continue high-intensity statin therapy for secondary prevention.  Follow-up: Return to clinic in 6 months.  Nelva Bush, MD 12/05/2016 10:31 AM

## 2016-12-05 NOTE — Patient Instructions (Addendum)
Medication Instructions:  Your physician recommends that you continue on your current medications as directed. Please refer to the Current Medication list given to you today.   Labwork: Your physician recommends that you have a  lipid profile today.   Testing/Procedures: None  Follow-Up: Your physician wants you to follow-up in: 6 months with Dr End. (March 2019).  You will receive a reminder letter in the mail two months in advance. If you don't receive a letter, please call our office to schedule the follow-up appointment.      If you need a refill on your cardiac medications before your next appointment, please call your pharmacy.

## 2016-12-06 ENCOUNTER — Encounter: Payer: Self-pay | Admitting: Internal Medicine

## 2016-12-06 DIAGNOSIS — I480 Paroxysmal atrial fibrillation: Secondary | ICD-10-CM | POA: Insufficient documentation

## 2016-12-06 HISTORY — DX: Paroxysmal atrial fibrillation: I48.0

## 2016-12-09 ENCOUNTER — Encounter: Payer: Self-pay | Admitting: Emergency Medicine

## 2016-12-09 ENCOUNTER — Ambulatory Visit (INDEPENDENT_AMBULATORY_CARE_PROVIDER_SITE_OTHER): Payer: Medicare Other | Admitting: Emergency Medicine

## 2016-12-09 DIAGNOSIS — I25119 Atherosclerotic heart disease of native coronary artery with unspecified angina pectoris: Secondary | ICD-10-CM

## 2016-12-09 DIAGNOSIS — R0600 Dyspnea, unspecified: Secondary | ICD-10-CM

## 2016-12-09 DIAGNOSIS — R0609 Other forms of dyspnea: Secondary | ICD-10-CM

## 2016-12-09 NOTE — Assessment & Plan Note (Signed)
He ascribes this largely due to side effects from amiodarone. His CT scan of the chest does not show any evidence of amiodarone toxicity or interstitial lung disease. Pulmonary function testing is primarily restricted although there may be some superimposed obstruction. His elevated right hemidiaphragm likely explains the restriction. Unclear cause of the hemidiaphragm abnormality but consider changes post CABG. Unclear as to whether he has significantly benefited from albuterol. He wants to continue to use it, try to tease out whether it gives him any benefit. If so we could consider long-acting bronchodilators at some point in the future.  Please continue to keep albuterol available to use 2 puffs as needed for shortness of breath Keep track of how often you use the albuterol. Depending on your usage pattern and symptom improvement we may decide to discuss additional inhaled medication at some point in the future. Follow with Dr Lamonte Sakai in 6 months or sooner if you have any problems

## 2016-12-09 NOTE — Patient Instructions (Signed)
Please continue to keep albuterol available to use 2 puffs as needed for shortness of breath Keep track of how often you use the albuterol. Depending on your usage pattern and symptom improvement we may decide to discuss additional inhaled medication at some point in the future. Follow with Dr Lamonte Sakai in 6 months or sooner if you have any problems

## 2016-12-09 NOTE — Progress Notes (Signed)
Subjective:    Patient ID: [name], male    DOB: 1945-11-09, 71 y.o.   MRN: [id number]  Shortness of Breath  Pertinent negatives include no ear pain, fever, headaches, leg swelling, rash, rhinorrhea, sore throat, vomiting or wheezing.   71 year old man never smoker with a history of coronary disease and CABG, chronic diastolic CHF, paroxysmal atrial fibrillation, complicated by stroke. He also has obstructive sleep apnea. Review of the notes indicates that his benazepril was increased in early July. He is on amiodarone and has been on this medication for 1 year after a hospitalization. He is referred for dyspnea and abnormal PFT.   He has undergone pulmonary function testing on 09/23/16 that I have reviewed. The test did not reach ATS standards. Spirometry was consistent with mixed obstruction and restriction. His lung volumes are restricted. His diffusion capacity was decreased but corrected to the normal range when adjusted for his alveolar volume.  He was just in the emergency department on 8/2 with complaints of "uneasiness, dyspnea". He had a chest x-ray at that time that I reviewed that shows no significant infiltrates, normal heart size, chronically elevated R HD. He received extra lasix, doesn't believe that his breathing was affected. He was given SABA and felt that it helped some. He did not desaturate with ambulation in the ED. The amiodarone was stopped at that visit. He believes that he has significantly improved since the amiodarone was stopped. Feels close to baseline.   ROV 12/09/16 -- This follow-up visit for patient with a history of dyspnea. He is a never smoker with coronary disease and atrial fibrillation. Also with obstructive sleep apnea. He has a history of amiodarone use and ascribes some of his clinical symptoms to that medication. It has been stopped. We performed a high-resolution CT scan of his chest on 11/04/16 that I have reviewed. This shows no evidence of  interstitial lung disease. He does have a chronically elevated right hemidiaphragm with associated atelectasis. Pulmonary function testing was done 11/26/16 that I have reviewed. This shows principally restrictive physiology, possibly some superimposed obstruction. His diffusion capacity is decreased but corrects to the normal range when adjusted for his alveolar volume. He tells me that he is feeling well. He has tried SABA a few times since last time, uncertain whether it helps him.    Review of Systems  Constitutional: Negative for fever and unexpected weight change.  HENT: Negative for congestion, dental problem, ear pain, nosebleeds, postnasal drip, rhinorrhea, sinus pressure, sneezing, sore throat and trouble swallowing.   Eyes: Negative for redness and itching.  Respiratory: Positive for shortness of breath. Negative for cough, chest tightness and wheezing.   Cardiovascular: Negative for palpitations and leg swelling.  Gastrointestinal: Negative for nausea and vomiting.  Genitourinary: Negative for dysuria.  Musculoskeletal: Negative for joint swelling.  Skin: Negative for rash.  Neurological: Negative for headaches.  Hematological: Does not bruise/bleed easily.  Psychiatric/Behavioral: Negative for dysphoric mood. The patient is not nervous/anxious.    Past Medical History:  Diagnosis Date  . CAD (coronary artery disease)    s/p CABG, s/p DES to LCX  January 2011  . Chronic pain    left sided-[place]  . CVA (cerebral infarction)    left hemiparesis  . Diastolic CHF ([place])   . Dysrhythmia    hx of atrial fibrilation with cardioversion  . GERD (gastroesophageal reflux disease)   . Gout   . Hyperlipidemia   . Hypertension   . Hypothyroidism   . OSA  on CPAP   . Sleep apnea   . Stroke ([place]) 1996  . Tubular adenoma of colon 05/2010     Family History  Problem Relation Age of Onset  . Lung cancer Father        deceased  . Stroke Mother        deceased-[place]      Social History   Social History  . Marital status: Married    Spouse name: [name]  . Number of children: N/A  . Years of education: 66   Occupational History  . retired Hydrographic surveyor   Social History Main Topics  . Smoking status: Never Smoker  . Smokeless tobacco: Never Used  . Alcohol use No     Comment: 1 beer a month  . Drug use: No  . Sexual activity: No   Other Topics Concern  . Not on file   Social History Narrative   Pt lives with wife. Does have stairs, but patient doesn't use them. Pt has completed technical school  he worked as a Airline pilot at an airport Has lived [place], [place] Was in the air force, Mining engineer.   No Known Allergies   Outpatient Medications Prior to Visit  Medication Sig Dispense Refill  . albuterol (PROVENTIL HFA;VENTOLIN HFA) 108 (90 Base) MCG/ACT inhaler Inhale into the lungs every 6 (six) hours as needed for wheezing or shortness of breath.    . allopurinol (ZYLOPRIM) 100 MG tablet TAKE 2 TABLETS BY MOUTH DAILY. D/C PREVIOUS SCRIPTS FOR THIS MEDICATION 180 tablet 1  . atorvastatin (LIPITOR) 40 MG tablet TAKE 2 TABLETS (80 MG TOTAL) BY MOUTH DAILY. 180 tablet 3  . benazepril (LOTENSIN) 10 MG tablet Take 1 tablet (10 mg total) by mouth daily. 135 tablet 1  . benazepril (LOTENSIN) 5 MG tablet Take 1 tablet (5 mg total) by mouth daily. Take 1 tablet daily with the 10 mg tablet of Benazepril = 15 mg daily 90 tablet 3  . fluticasone (FLONASE) 50 MCG/ACT nasal spray Place 2 sprays into both nostrils daily. 9.9 g 1  . furosemide (LASIX) 40 MG tablet Take 1 tablet (40 mg total) by mouth every other day. 45 tablet 1  . isosorbide mononitrate (IMDUR) 30 MG 24 hr tablet Take 0.5 tablets (15 mg total) by mouth daily. 30 tablet 6  . isosorbide mononitrate (IMDUR) 30 MG 24 hr tablet TAKE 1 TABLET (30 MG TOTAL) BY MOUTH DAILY. 30 tablet 6  . lactulose (CHRONULAC) 10 GM/15ML solution Take 15 mLs (10 g total) by mouth daily as needed for mild  constipation. 240 mL 1  . levothyroxine (SYNTHROID, LEVOTHROID) 112 MCG tablet Take 1 tablet (112 mcg total) by mouth daily before breakfast. 90 tablet 0  . metFORMIN (GLUCOPHAGE) 500 MG tablet Take 1 tablet (500 mg total) by mouth daily with breakfast. 90 tablet 1  . Multiple Vitamins-Minerals (MULTIVITAMIN GUMMIES MENS PO) Take 1 tablet by mouth daily.    . nitroGLYCERIN (NITROSTAT) 0.4 MG SL tablet Place 1 tablet (0.4 mg total) under the tongue every 5 (five) minutes as needed for chest pain. Up to 3 doses 10 tablet 2  . pantoprazole (PROTONIX) 40 MG tablet Take 1 tablet (40 mg total) by mouth daily. 90 tablet 0  . traMADol (ULTRAM) 50 MG tablet Take 1 tablet (50 mg total) by mouth 2 (two) times daily. 180 tablet 0  . traMADol (ULTRAM-ER) 200 MG 24 hr tablet Take 200 mg by mouth 2 (two) times daily.    Marland Kitchen  vitamin B-12 1000 MCG tablet Take 1 tablet (1,000 mcg total) by mouth daily. 30 tablet 0  . warfarin (COUMADIN) 2.5 MG tablet TAKE AS DIRECTED BY ANTICOAGULATION CLINIC 100 tablet 1   No facility-administered medications prior to visit.         Objective:   Physical Exam Vitals:   12/09/16 0915 12/09/16 0916  BP:  140/90  Pulse:  74  SpO2:  96%  Weight: 117 kg (258 lb)   Height: 6\' 3"  (1.905 m)    Gen: Pleasant, Obese in no distress,  normal affect  ENT: No lesions,  mouth clear,  oropharynx clear, no postnasal drip  Neck: No JVD, no stridor  Lungs: No use of accessory muscles, decreased at R base, no wheezes, no crackles  Cardiovascular: RRR, heart sounds normal, no murmur or gallops, no peripheral edema  Musculoskeletal: No deformities, no cyanosis or clubbing  Neuro: alert, non focal  Skin: Warm, no lesions or rashes        Assessment & Plan:  Dyspnea on exertion He ascribes this largely due to side effects from amiodarone. His CT scan of the chest does not show any evidence of amiodarone toxicity or interstitial lung disease. Pulmonary function testing is  primarily restricted although there may be some superimposed obstruction. His elevated right hemidiaphragm likely explains the restriction. Unclear cause of the hemidiaphragm abnormality but consider changes post CABG. Unclear as to whether he has significantly benefited from albuterol. He wants to continue to use it, try to tease out whether it gives him any benefit. If so we could consider long-acting bronchodilators at some point in the future.  Please continue to keep albuterol available to use 2 puffs as needed for shortness of breath Keep track of how often you use the albuterol. Depending on your usage pattern and symptom improvement we may decide to discuss additional inhaled medication at some point in the future. Follow with Dr Lamonte Sakai in 6 months or sooner if you have any problems  Baltazar Apo, MD, PhD 12/09/2016, 9:40 AM [facility] [phone] or if no answer [phone]

## 2016-12-15 ENCOUNTER — Other Ambulatory Visit: Payer: Self-pay | Admitting: Family

## 2016-12-15 NOTE — Telephone Encounter (Signed)
Rx approved and sent to the pharmacy by e-script.//AB/CMA 

## 2016-12-17 ENCOUNTER — Telehealth: Payer: Self-pay | Admitting: *Deleted

## 2016-12-17 MED ORDER — FUROSEMIDE 40 MG PO TABS
40.0000 mg | ORAL_TABLET | ORAL | 1 refills | Status: DC
Start: 2016-12-17 — End: 2017-07-13

## 2016-12-17 NOTE — Telephone Encounter (Signed)
Received fax from [facility] requesting refill of furosemide 40mg . Refill sent.

## 2016-12-30 ENCOUNTER — Encounter: Payer: Medicare Other | Attending: Physical Medicine & Rehabilitation

## 2016-12-30 ENCOUNTER — Encounter: Payer: Self-pay | Admitting: Physical Medicine & Rehabilitation

## 2016-12-30 ENCOUNTER — Ambulatory Visit (HOSPITAL_BASED_OUTPATIENT_CLINIC_OR_DEPARTMENT_OTHER): Payer: Medicare Other | Admitting: Physical Medicine & Rehabilitation

## 2016-12-30 VITALS — BP 121/67 | HR 81 | Resp 14

## 2016-12-30 DIAGNOSIS — G8110 Spastic hemiplegia affecting unspecified side: Secondary | ICD-10-CM

## 2016-12-30 DIAGNOSIS — G8111 Spastic hemiplegia affecting right dominant side: Secondary | ICD-10-CM | POA: Insufficient documentation

## 2016-12-30 MED ORDER — TRAMADOL HCL ER 200 MG PO TB24
200.0000 mg | ORAL_TABLET | Freq: Every day | ORAL | 0 refills | Status: DC
Start: 2016-12-30 — End: 2017-03-31

## 2016-12-30 MED ORDER — TRAMADOL HCL 50 MG PO TABS: 50.0000 mg | ORAL_TABLET | Freq: Two times a day (BID) | ORAL | 0 refills | Status: DC

## 2016-12-30 NOTE — Patient Instructions (Signed)

## 2016-12-30 NOTE — Progress Notes (Signed)
Botox Injection for spasticity using needle EMG guidance  Dilution: 50 Units/ml Indication: Severe spasticity which interferes with ADL,mobility and/or  hygiene and is unresponsive to medication management and other conservative care Informed consent was obtained after describing risks and benefits of the procedure with the patient. This includes bleeding, bruising, infection, excessive weakness, or medication side effects. A REMS form is on file and signed. Needle: 27g 1" needle electrode Number of units per muscle  FDS 50U [id number]  [id number] All injections were done after obtaining appropriate EMG activity and after negative drawback for blood. The patient tolerated the procedure well. Post procedure instructions were given. A followup appointment was made.

## 2016-12-31 ENCOUNTER — Telehealth: Payer: Self-pay | Admitting: Emergency Medicine

## 2016-12-31 NOTE — Telephone Encounter (Signed)
Left message for patient to call back  

## 2017-01-02 DIAGNOSIS — Z23 Encounter for immunization: Secondary | ICD-10-CM | POA: Diagnosis not present

## 2017-01-02 NOTE — Telephone Encounter (Signed)
ATC pt, no answer. Left message for pt to call back.  

## 2017-01-05 NOTE — Telephone Encounter (Signed)
lmtcb X3 for pt.  Will close encounter per triage protocol.  

## 2017-01-12 ENCOUNTER — Other Ambulatory Visit: Payer: Self-pay | Admitting: Family

## 2017-01-12 ENCOUNTER — Other Ambulatory Visit: Payer: Self-pay | Admitting: Cardiology

## 2017-01-13 ENCOUNTER — Inpatient Hospital Stay (HOSPITAL_COMMUNITY): Payer: Medicare Other

## 2017-01-13 ENCOUNTER — Encounter (HOSPITAL_COMMUNITY): Payer: Self-pay | Admitting: Nurse Practitioner

## 2017-01-13 ENCOUNTER — Ambulatory Visit (HOSPITAL_BASED_OUTPATIENT_CLINIC_OR_DEPARTMENT_OTHER)
Admission: RE | Admit: 2017-01-13 | Discharge: 2017-01-13 | Disposition: A | Discharge status: Home / Self Care | Payer: Medicare Other | Source: Ambulatory Visit | Attending: Nurse Practitioner | Admitting: Nurse Practitioner

## 2017-01-13 ENCOUNTER — Encounter (HOSPITAL_COMMUNITY): Payer: Self-pay | Admitting: Radiology

## 2017-01-13 ENCOUNTER — Emergency Department (HOSPITAL_COMMUNITY): Payer: Medicare Other

## 2017-01-13 ENCOUNTER — Other Ambulatory Visit: Payer: Self-pay

## 2017-01-13 ENCOUNTER — Observation Stay (HOSPITAL_COMMUNITY)
Admission: EM | Admit: 2017-01-13 | Discharge: 2017-01-14 | Disposition: A | Discharge status: Home / Self Care | Payer: Medicare Other | Attending: Family Medicine | Admitting: Family Medicine

## 2017-01-13 VITALS — BP 154/76 | HR 75 | Ht 75.0 in | Wt 261.0 lb

## 2017-01-13 DIAGNOSIS — Z79899 Other long term (current) drug therapy: Secondary | ICD-10-CM | POA: Insufficient documentation

## 2017-01-13 DIAGNOSIS — R1011 Right upper quadrant pain: Secondary | ICD-10-CM | POA: Diagnosis not present

## 2017-01-13 DIAGNOSIS — E039 Hypothyroidism, unspecified: Secondary | ICD-10-CM | POA: Diagnosis present

## 2017-01-13 DIAGNOSIS — Z8673 Personal history of transient ischemic attack (TIA), and cerebral infarction without residual deficits: Secondary | ICD-10-CM | POA: Diagnosis not present

## 2017-01-13 DIAGNOSIS — E785 Hyperlipidemia, unspecified: Secondary | ICD-10-CM | POA: Diagnosis not present

## 2017-01-13 DIAGNOSIS — I517 Cardiomegaly: Secondary | ICD-10-CM | POA: Diagnosis not present

## 2017-01-13 DIAGNOSIS — K219 Gastro-esophageal reflux disease without esophagitis: Secondary | ICD-10-CM | POA: Diagnosis not present

## 2017-01-13 DIAGNOSIS — Z7901 Long term (current) use of anticoagulants: Secondary | ICD-10-CM

## 2017-01-13 DIAGNOSIS — I480 Paroxysmal atrial fibrillation: Secondary | ICD-10-CM | POA: Diagnosis present

## 2017-01-13 DIAGNOSIS — E119 Type 2 diabetes mellitus without complications: Secondary | ICD-10-CM

## 2017-01-13 DIAGNOSIS — Z9861 Coronary angioplasty status: Secondary | ICD-10-CM | POA: Diagnosis not present

## 2017-01-13 DIAGNOSIS — R791 Abnormal coagulation profile: Secondary | ICD-10-CM | POA: Diagnosis not present

## 2017-01-13 DIAGNOSIS — Z7984 Long term (current) use of oral hypoglycemic drugs: Secondary | ICD-10-CM | POA: Insufficient documentation

## 2017-01-13 DIAGNOSIS — R202 Paresthesia of skin: Secondary | ICD-10-CM

## 2017-01-13 DIAGNOSIS — Z951 Presence of aortocoronary bypass graft: Secondary | ICD-10-CM

## 2017-01-13 DIAGNOSIS — I639 Cerebral infarction, unspecified: Secondary | ICD-10-CM | POA: Diagnosis not present

## 2017-01-13 DIAGNOSIS — R29898 Other symptoms and signs involving the musculoskeletal system: Secondary | ICD-10-CM | POA: Diagnosis not present

## 2017-01-13 DIAGNOSIS — M109 Gout, unspecified: Secondary | ICD-10-CM | POA: Insufficient documentation

## 2017-01-13 DIAGNOSIS — I10 Essential (primary) hypertension: Secondary | ICD-10-CM | POA: Diagnosis present

## 2017-01-13 DIAGNOSIS — I110 Hypertensive heart disease with heart failure: Secondary | ICD-10-CM | POA: Insufficient documentation

## 2017-01-13 DIAGNOSIS — I2583 Coronary atherosclerosis due to lipid rich plaque: Secondary | ICD-10-CM

## 2017-01-13 DIAGNOSIS — G459 Transient cerebral ischemic attack, unspecified: Secondary | ICD-10-CM | POA: Diagnosis not present

## 2017-01-13 DIAGNOSIS — R109 Unspecified abdominal pain: Secondary | ICD-10-CM | POA: Diagnosis not present

## 2017-01-13 DIAGNOSIS — I25118 Atherosclerotic heart disease of native coronary artery with other forms of angina pectoris: Secondary | ICD-10-CM | POA: Diagnosis present

## 2017-01-13 DIAGNOSIS — I6930 Unspecified sequelae of cerebral infarction: Secondary | ICD-10-CM

## 2017-01-13 DIAGNOSIS — I482 Chronic atrial fibrillation: Secondary | ICD-10-CM | POA: Diagnosis not present

## 2017-01-13 DIAGNOSIS — R41841 Cognitive communication deficit: Secondary | ICD-10-CM | POA: Insufficient documentation

## 2017-01-13 DIAGNOSIS — G4733 Obstructive sleep apnea (adult) (pediatric): Secondary | ICD-10-CM | POA: Diagnosis not present

## 2017-01-13 DIAGNOSIS — I2510 Atherosclerotic heart disease of native coronary artery without angina pectoris: Secondary | ICD-10-CM | POA: Diagnosis not present

## 2017-01-13 DIAGNOSIS — E7800 Pure hypercholesterolemia, unspecified: Secondary | ICD-10-CM

## 2017-01-13 DIAGNOSIS — Z5181 Encounter for therapeutic drug level monitoring: Secondary | ICD-10-CM

## 2017-01-13 DIAGNOSIS — R200 Anesthesia of skin: Secondary | ICD-10-CM

## 2017-01-13 DIAGNOSIS — I5032 Chronic diastolic (congestive) heart failure: Secondary | ICD-10-CM | POA: Diagnosis present

## 2017-01-13 DIAGNOSIS — I679 Cerebrovascular disease, unspecified: Secondary | ICD-10-CM | POA: Diagnosis not present

## 2017-01-13 HISTORY — DX: Cerebral infarction, unspecified: I63.9

## 2017-01-13 LAB — I-STAT TROPONIN, ED
TROPONIN I, POC: 0 ng/mL (ref 0.00–0.08)
TROPONIN I, POC: 0 ng/mL (ref 0.00–0.08)

## 2017-01-13 LAB — CBC
HEMATOCRIT: 41.6 % (ref 39.0–52.0)
HEMOGLOBIN: 13.8 g/dL (ref 13.0–17.0)
MCH: 30.2 pg (ref 26.0–34.0)
MCHC: 33.2 g/dL (ref 30.0–36.0)
MCV: 91 fL (ref 78.0–100.0)
Platelets: 153 10*3/uL (ref 150–400)
RBC: 4.57 MIL/uL (ref 4.22–5.81)
RDW: 13.9 % (ref 11.5–15.5)
WBC: 5.8 10*3/uL (ref 4.0–10.5)

## 2017-01-13 LAB — COMPREHENSIVE METABOLIC PANEL
ALK PHOS: 60 U/L (ref 38–126)
ALT: 31 U/L (ref 17–63)
AST: 32 U/L (ref 15–41)
Albumin: 4 g/dL (ref 3.5–5.0)
Anion gap: 10 (ref 5–15)
BILIRUBIN TOTAL: 0.8 mg/dL (ref 0.3–1.2)
BUN: 15 mg/dL (ref 6–20)
CALCIUM: 9.2 mg/dL (ref 8.9–10.3)
CO2: 25 mmol/L (ref 22–32)
Chloride: 103 mmol/L (ref 101–111)
Creatinine, Ser: 1.11 mg/dL (ref 0.61–1.24)
GFR calc Af Amer: >60 mL/min (ref >60)
Glucose, Bld: 98 mg/dL (ref 65–99)
POTASSIUM: 4.1 mmol/L (ref 3.5–5.1)
Sodium: 138 mmol/L (ref 135–145)
TOTAL PROTEIN: 7.1 g/dL (ref 6.5–8.1)

## 2017-01-13 LAB — DIFFERENTIAL
BASOS ABS: 0.1 10*3/uL (ref 0.0–0.1)
Basophils Relative: 2 %
EOS ABS: 0.3 10*3/uL (ref 0.0–0.7)
EOS PCT: 5 %
LYMPHS ABS: 2 10*3/uL (ref 0.7–4.0)
Lymphocytes Relative: 34 %
Monocytes Absolute: 0.5 10*3/uL (ref 0.1–1.0)
Monocytes Relative: 9 %
Neutro Abs: 2.9 10*3/uL (ref 1.7–7.7)
Neutrophils Relative %: 50 %

## 2017-01-13 LAB — APTT: aPTT: 33 seconds (ref 24–36)

## 2017-01-13 LAB — PROTIME-INR
INR: 1.75
Prothrombin Time: 20.3 seconds — ABNORMAL HIGH (ref 11.4–15.2)

## 2017-01-13 LAB — LIPASE, BLOOD: Lipase: 28 U/L (ref 11–51)

## 2017-01-13 MED ORDER — ONDANSETRON HCL 4 MG/2ML IJ SOLN: 4.0000 mg | Freq: Three times a day (TID) | INTRAMUSCULAR | Status: DC | PRN

## 2017-01-13 MED ORDER — GI COCKTAIL ~~LOC~~
30.0000 mL | Freq: Once | ORAL | Status: AC
  Administered 2017-01-13: 30 mL via ORAL
  Filled 2017-01-13: qty 30

## 2017-01-13 MED ORDER — ZOLPIDEM TARTRATE 5 MG PO TABS: 5.0000 mg | ORAL_TABLET | Freq: Every evening | ORAL | Status: DC | PRN

## 2017-01-13 MED ORDER — SENNOSIDES-DOCUSATE SODIUM 8.6-50 MG PO TABS: 1.0000 | ORAL_TABLET | Freq: Every evening | ORAL | Status: DC | PRN

## 2017-01-13 MED ORDER — INSULIN ASPART 100 UNIT/ML ~~LOC~~ SOLN: 0.0000 [IU] | Freq: Every day | SUBCUTANEOUS | Status: DC

## 2017-01-13 MED ORDER — FAMOTIDINE 20 MG PO TABS
20.0000 mg | ORAL_TABLET | Freq: Once | ORAL | Status: AC
  Administered 2017-01-13: 20 mg via ORAL
  Filled 2017-01-13: qty 1

## 2017-01-13 MED ORDER — INSULIN ASPART 100 UNIT/ML ~~LOC~~ SOLN
0.0000 [IU] | Freq: Three times a day (TID) | SUBCUTANEOUS | Status: DC
Start: 2017-01-14 — End: 2017-01-14
  Administered 2017-01-14: 1 [IU] via SUBCUTANEOUS

## 2017-01-13 MED ORDER — HYDRALAZINE HCL 20 MG/ML IJ SOLN: 5.0000 mg | INTRAMUSCULAR | Status: DC | PRN

## 2017-01-13 MED ORDER — STROKE: EARLY STAGES OF RECOVERY BOOK
Freq: Once | Status: AC
  Administered 2017-01-14: 07:00:00
  Filled 2017-01-13: qty 1

## 2017-01-13 MED ORDER — ACETAMINOPHEN 160 MG/5ML PO SOLN: 650.0000 mg | ORAL | Status: DC | PRN

## 2017-01-13 MED ORDER — MORPHINE SULFATE (PF) 4 MG/ML IV SOLN: 2.0000 mg | INTRAVENOUS | Status: DC | PRN

## 2017-01-13 MED ORDER — ACETAMINOPHEN 650 MG RE SUPP: 650.0000 mg | RECTAL | Status: DC | PRN

## 2017-01-13 MED ORDER — ACETAMINOPHEN 325 MG PO TABS: 650.0000 mg | ORAL_TABLET | ORAL | Status: DC | PRN

## 2017-01-13 NOTE — ED Provider Notes (Signed)
[name] EMERGENCY DEPARTMENT Provider Note   CSN: [id number] Arrival date & time: 01/13/17  1446     History   Chief Complaint Chief Complaint  Patient presents with  . bilateral arm numbness/1230 p\m    HPI [name] is a 71 y.o. male with history of CAD status post CABG, chronic diastolic heart failure (LVEF 50-55%), paroxysmal atrial fibrillation on warfarin, stroke with left-sided deficits, HTN, HLD presents to the ED for evaluation of right forearm discomfort onset around 11 AM today, lasting an hour and now resolved. Describes right forearm discomfort as a "pulsating", "pins and needles" sensation and heaviness. Was able to feel and move his hand and fingertips. Chart review reveals pt seen at a-fib clinic earlier today where he was noted to have right arm numbness, difficulty walking with right leg not functioning as well, staggering gait and thinking/talking slower. NP sent pt to ED.   Associated symptoms include gradually worsening indigestion, feels like he has to take a tums.  Denies abdominal pain, n/v/d/c, belching. Denies previous h/o reflux, PUD, gastritis. Pt thinks his acid reflux and arm discomfort may be from a faulty stent from CABG but denies CP, SOB, palpitations, light-headedness.   Marland Kitchen  HPI  Past Medical History:  Diagnosis Date  . CAD (coronary artery disease)    s/p CABG, s/p DES to LCX  January 2011  . Chronic pain    left sided-[place]  . CVA (cerebral infarction)    left hemiparesis  . Diastolic CHF ([place])   . Dysrhythmia    hx of atrial fibrilation with cardioversion  . GERD (gastroesophageal reflux disease)   . Gout   . Hyperlipidemia   . Hypertension   . Hypothyroidism   . OSA on CPAP   . Sleep apnea   . Stroke ([place]) 1996  . Tubular adenoma of colon 05/2010    Patient Active Problem List   Diagnosis Date Noted  . PAF (paroxysmal atrial fibrillation) ([place]) 12/06/2016  . Dyspnea on exertion 10/21/2016  .  Acute respiratory failure with hypoxia and hypercapnia ([place]) 12/12/2015  . Coronary artery disease due to lipid rich plaque 11/04/2015  . CAD S/P CFX DES 2011 04/25/2015  . Persistent atrial fibrillation ([place]) 04/25/2015  . Vasovagal syncope   . Constipation 04/24/2015  . Chronic diastolic heart failure ([place]) 04/24/2015  . B12 deficiency 08/11/2013  . Sensory disturbance 08/11/2013  . Ataxia 08/10/2013  . Insomnia 05/21/2013  . Hypothyroidism, acquired 02/15/2013  . Fatigue 11/17/2012  . Spastic hemiplegia affecting dominant side ([place]) 07/21/2011  . Long term current use of anticoagulant therapy 06/24/2011  . Hypogonadism male 04/10/2011  . Allergic rhinitis 02/13/2010  . BENIGN POSITIONAL VERTIGO 11/23/2009  . 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 HYPOTHYROIDISM 04/02/2009  . Type 2 diabetes mellitus without complication, without long-term current use of insulin ([place]) 10/05/2007  . Obstructive sleep apnea-Failed CPAP 06/17/2007  . Hyperlipidemia 12/12/2006  . GOUT 12/12/2006  . Essential hypertension 12/12/2006  . History of stroke with residual deficit 12/12/2006  . GERD 12/12/2006    Past Surgical History:  Procedure Laterality Date  . CARDIAC CATHETERIZATION N/A 10/25/2015   Procedure: Left Heart Cath and Cors/Grafts Angiography;  Surgeon: Troy Sine, MD;  Location: [facility];  Service: Cardiovascular;  Laterality: N/A;  . CARDIOVERSION  06/20/2011   Procedure: CARDIOVERSION;  Surgeon: Larey Dresser, MD;  Location: [facility];  Service: Cardiovascular;  Laterality: N/A;  . CARDIOVERSION N/A 04/26/2015   Procedure:  CARDIOVERSION;  Surgeon: Dorothy Spark, MD;  Location: [facility] ENDOSCOPY;  Service: Cardiovascular;  Laterality: N/A;  . CARDIOVERSION N/A 05/10/2015   Procedure: CARDIOVERSION;  Surgeon: Larey Dresser, MD;  Location: [facility];  Service: Cardiovascular;  Laterality: N/A;  . CATARACT EXTRACTION  05/2010   left eye  . CATARACT EXTRACTION  04/2010   right eye   . CORONARY ARTERY BYPASS GRAFT     stent  . ESOPHAGOGASTRODUODENOSCOPY  03-25-2005  . NASAL SEPTUM SURGERY    . PERCUTANEOUS PLACEMENT INTRAVASCULAR STENT CERVICAL CAROTID ARTERY     03-2009; using a drug-eluting platform of the circumflex cornoray artery with a 3.0 x 18 [employer] Promus drug-eluting platform post dilated to 3.75 with a noncompliant balloon.  . TEE WITHOUT CARDIOVERSION  06/20/2011   Procedure: TRANSESOPHAGEAL ECHOCARDIOGRAM (TEE);  Surgeon: Larey Dresser, MD;  Location: [facility];  Service: Cardiovascular;  Laterality: N/A;       Home Medications    Prior to Admission medications   Medication Sig Start Date End Date Taking? Authorizing Provider  albuterol (PROVENTIL HFA;VENTOLIN HFA) 108 (90 Base) MCG/ACT inhaler Inhale 1-2 puffs into the lungs every 6 (six) hours as needed for wheezing or shortness of breath.    Yes [provider]  allopurinol (ZYLOPRIM) 100 MG tablet TAKE 2 TABLETS EVERY DAY Patient taking differently: Take 200 mg by mouth in the morning 01/13/17  Yes Debbrah Alar, NP  atorvastatin (LIPITOR) 40 MG tablet TAKE 2 TABLETS (80 MG TOTAL) BY MOUTH DAILY. Patient taking differently: Take 80 mg by mouth at bedtime.  04/29/16  Yes Weaver, Scott T, PA-C  benazepril (LOTENSIN) 10 MG tablet Take 1 tablet (10 mg total) by mouth daily. Patient taking differently: Take 10 mg by mouth every morning. IN CONJUNCTION WITH ONE 5 MG. TABLET TO EQUAL A TOTAL DOSE OF 15 MILLIGRAMS 08/25/16  Yes End, Harrell Gave, MD  benazepril (LOTENSIN) 5 MG tablet Take 1 tablet (5 mg total) by mouth daily. Take 1 tablet daily with the 10 mg tablet of Benazepril = 15 mg daily Patient taking differently: Take 5 mg by mouth every morning. IN CONJUNCTION WITH ONE 10 MG. TABLET TO EQUAL A TOTAL DOSE OF 15 MILLIGRAMS 09/18/16  Yes End, Harrell Gave, MD  fluticasone (FLONASE) 50 MCG/ACT nasal spray Place 2 sprays into both nostrils daily. Patient taking differently: Place 2  sprays into both nostrils daily as needed for allergies.  06/02/14  Yes Brunetta Jeans, PA-C  furosemide (LASIX) 40 MG tablet Take 1 tablet (40 mg total) by mouth every other day. 12/17/16  Yes Debbrah Alar, NP  isosorbide mononitrate (IMDUR) 30 MG 24 hr tablet Take 0.5 tablets (15 mg total) by mouth daily. Patient taking differently: Take 15 mg by mouth every morning.  11/05/15  Yes Weaver, Scott T, PA-C  lactulose (CHRONULAC) 10 GM/15ML solution Take 15 mLs (10 g total) by mouth daily as needed for mild constipation. 07/16/16  Yes Debbrah Alar, NP  levothyroxine (SYNTHROID, LEVOTHROID) 112 MCG tablet TAKE 1 TABLET BY MOUTH DAILY BEFORE BREAKFAST. Patient taking differently: Take 112 mcg by mouth in the morning before breakfast 12/15/16  Yes Debbrah Alar, NP  metFORMIN (GLUCOPHAGE) 500 MG tablet Take 1 tablet (500 mg total) by mouth daily with breakfast. Patient taking differently: Take 500 mg by mouth 3 (three) times a week.  05/21/16  Yes Debbrah Alar, NP  Multiple Vitamins-Minerals (MULTIVITAMIN GUMMIES MENS PO) Take 1 tablet by mouth daily with breakfast.    Yes [provider]  nitroGLYCERIN (NITROSTAT) 0.4 MG SL tablet Place 1 tablet (0.4 mg total) under the tongue every 5 (five) minutes as needed for chest pain. Up to 3 doses 03/05/15  Yes Debbrah Alar, NP  OXYGEN Inhale 2 L into the lungs at bedtime.   Yes [provider]  pantoprazole (PROTONIX) 40 MG tablet Take 1 tablet (40 mg total) by mouth daily. Patient taking differently: Take 40 mg by mouth every morning.  10/09/16  Yes Debbrah Alar, NP  traMADol (ULTRAM) 50 MG tablet Take 1 tablet (50 mg total) by mouth 2 (two) times daily. Patient taking differently: Take 50 mg by mouth 2 (two) times daily. MORNING AND BEDTIME 12/30/16  Yes Kirsteins, Luanna Salk, MD  traMADol (ULTRAM-ER) 200 MG 24 hr tablet Take 1 tablet (200 mg total) by mouth daily. Patient taking differently: Take 200 mg by  mouth every morning.  12/30/16  Yes Kirsteins, Luanna Salk, MD  vitamin B-12 1000 MCG tablet Take 1 tablet (1,000 mcg total) by mouth daily. 08/11/13  Yes Tat, Shanon Brow, MD  warfarin (COUMADIN) 2.5 MG tablet TAKE AS DIRECTED BY ANTICOAGULATION CLINIC Patient taking differently: Take 1.25 mg by mouth in the morning on Sun/Tues/Thurs/Sat and 2.5 mg on Mon/Wed/Fri 12/01/16  Yes Larey Dresser, MD    Family History Family History  Problem Relation Age of Onset  . Lung cancer Father        deceased  . Stroke Mother        deceased-[place]    Social History Social History  Substance Use Topics  . Smoking status: Never Smoker  . Smokeless tobacco: Never Used  . Alcohol use No     Comment: 1 beer a month     Allergies   Patient has no known allergies.   Review of Systems Review of Systems  Constitutional: Negative for chills and fever.  Respiratory: Negative for cough and shortness of breath.   Cardiovascular: Negative for chest pain and palpitations.  Gastrointestinal: Negative for abdominal pain, blood in stool, constipation, diarrhea, nausea and vomiting.       +indigestion  Genitourinary: Negative for difficulty urinating and dysuria.  Musculoskeletal: Positive for myalgias ("upper body achiness").  Skin: Negative for wound.  Neurological: Positive for numbness (chronic on left). Negative for tremors, facial asymmetry, speech difficulty and weakness.     Physical Exam Updated Vital Signs BP (!) 154/67   Pulse 70   Temp 98 F (36.7 C)   Resp 16   Ht 6\' 3"  (1.905 m)   Wt 117.9 kg (260 lb)   SpO2 95%   BMI 32.50 kg/m   Physical Exam  Constitutional: He is oriented to person, place, and time. He appears well-developed and well-nourished. No distress.  NAD.  HENT:  Head: Normocephalic and atraumatic.  Right Ear: External ear normal.  Left Ear: External ear normal.  Nose: Nose normal.  Mouth/Throat: Oropharynx is clear and moist. No oropharyngeal exudate.  Eyes:  Conjunctivae and EOM are normal. No scleral icterus.  Neck: Normal range of motion. Neck supple.  Cardiovascular: Normal rate, regular rhythm, normal heart sounds and intact distal pulses.   No murmur heard. Pulses:      Radial pulses are 2+ on the right side, and 2+ on the left side.       Dorsalis pedis pulses are 2+ on the right side, and 2+ on the left side.  No LE edema Calves supple without tenderness  Pulmonary/Chest: Effort normal and breath sounds normal. He has no wheezes. He  exhibits no tenderness.  Abdominal: Soft. He exhibits no distension. There is no tenderness.  NTND, no epigastric tenderness No G/R/R  Musculoskeletal: Normal range of motion. He exhibits no deformity.  Neurological: He is alert and oriented to person, place, and time. A sensory deficit is present. He exhibits abnormal muscle tone.  4/5 strength and decreased sensation to light touch in left hand and ankle (chronic) 5/5 strength and sensation to light touch intact in right hand and ankle Speech and phonation normal.  Negative romberg. No arm drift. Slow but steady gait.  CN I and VIII not tested. CN II-XII intact bilaterally.   Skin: Skin is warm and dry. Capillary refill takes less than 2 seconds.  Psychiatric: He has a normal mood and affect. His behavior is normal. Judgment and thought content normal.  Nursing note and vitals reviewed.    ED Treatments / Results  Labs (all labs ordered are listed, but only abnormal results are displayed) Labs Reviewed  PROTIME-INR - Abnormal; Notable for the following:       Result Value   Prothrombin Time 20.3 (*)    All other components within normal limits  APTT  CBC  DIFFERENTIAL  COMPREHENSIVE METABOLIC PANEL  LIPASE, BLOOD  I-STAT TROPONIN, ED  I-STAT TROPONIN, ED    EKG  EKG Interpretation None       Radiology Dg Chest 2 View  Result Date: 01/13/2017 CLINICAL DATA:  Heartburn.  Right arm weakness. EXAM: CHEST  2 VIEW COMPARISON:   11/04/2016 CT.  10/16/2016 chest radiograph. FINDINGS: Prior median sternotomy. Artifact degraded lateral view posteriorly. Patient rotated to the right on the frontal radiograph. Prior median sternotomy. Moderate right hemidiaphragm elevation. Midline trachea. Mild cardiomegaly with a tortuous thoracic aorta. No pleural effusion or pneumothorax. No congestive failure. Clear lungs. IMPRESSION: No acute cardiopulmonary disease. Right hemidiaphragm elevation. Electronically Signed   By: Abigail Miyamoto M.D.   On: 01/13/2017 21:29   Ct Head Wo Contrast  Result Date: 01/13/2017 CLINICAL DATA:  71 year old male with right arm numbness beginning at 1230 hours today. Symptoms now resolved. EXAM: CT HEAD WITHOUT CONTRAST TECHNIQUE: Contiguous axial images were obtained from the base of the skull through the vertex without intravenous contrast. COMPARISON:  Head CT 02/20/2016.  Brain MRI 11/25/2014 and earlier. FINDINGS: Brain: Patchy and confluent bilateral cerebral white matter T2 and FLAIR hyperintensity including what on the prior MRI appeared to be a chronic lacunar type infarct of the right deep white matter capsules and lentiform nucleus. Stable cerebral volume since 2016. No midline shift, ventriculomegaly, mass effect, evidence of mass lesion, intracranial hemorrhage or evidence of cortically based acute infarction. Stable gray-white matter differentiation throughout the brain. Vascular: Mild Calcified atherosclerosis at the skull base. No suspicious intracranial vascular hyperdensity. Skull: No acute osseous abnormality identified. Sinuses/Orbits: Mild bilateral paranasal sinus mucosal thickening, similar to the 2016 MRI. Tympanic cavities and mastoids are clear. Other: No acute orbit or scalp soft tissue findings. IMPRESSION: Advanced chronic small vessel disease appears stable since 2016. No acute intracranial abnormality. Electronically Signed   By: Genevie Ann M.D.   On: 01/13/2017 15:39     Procedures Procedures (including critical care time)  Medications Ordered in ED Medications  gi cocktail (Maalox,Lidocaine,Donnatal) (30 mLs Oral Given 01/13/17 2130)  famotidine (PEPCID) tablet 20 mg (20 mg Oral Given 01/13/17 2130)     Initial Impression / Assessment and Plan / ED Course  I have reviewed the triage vital signs and the nursing notes.  Pertinent labs &  imaging results that were available during my care of the patient were reviewed by me and considered in my medical decision making (see chart for details).    Pt presents from a-fib clinic with concern for possible CVA vs TIA. Per chart review provider at a-fib clinic noted pt had difficulty walking with right leg not functioning as well, staggering gait and thinking/talking slower than normal.  To me, pt reports right forearm discomfort and worsening indigestion that he thinks may be from a bad stent from his CABG. When asked about right sided deficits noticed earlier today at a-fib clinic he seems to remember and says they are resolved.    Exam shows left sided deficits, chronic from previous CVA. Otherwise normal. VS WNL. Lab work including CBC, CMP, lipase, trop x 2, PTINR unremarkable. CT scan unchanged from previous. Last echo 2017. Last carotid US 2015. Will request admission for TIA work up. Spoke to Dr Leonel Ramsay who agrees with admission. Consult hospitalist pending.   Final Clinical Impressions(s) / ED Diagnoses   Final diagnoses:  TIA (transient ischemic attack)    New Prescriptions New Prescriptions   No medications on file     Arlean Hopping 01/13/17 2307    Isla Pence, MD 01/13/17 2311

## 2017-01-13 NOTE — ED Notes (Signed)
Patient transported to Ultrasound 

## 2017-01-13 NOTE — Telephone Encounter (Signed)
Pt is overdue for follow-up in [facility]in [facility], missed last appt on 12/22/16.  Called pt, he is currently in ED awaiting admit to hospital.  Advised pt to call Coumadin Clinic once discharged to reschedule appt to check INR.

## 2017-01-13 NOTE — Telephone Encounter (Signed)
Allopurinol refill sent to pharmacy. Pt last seen by PCP 07/2016 and has no future appts scheduled. When should pt follow up in the office?

## 2017-01-13 NOTE — ED Triage Notes (Signed)
Patient brought up from heart failure clinic due to having some right arm weakness earlier today around 1230. Denies pain. Has left arm and leg deficits from previous stroke years ago. On assessment no arm drift, no slurred speech, no facial droop. Reports that the arm numbness is resolved

## 2017-01-13 NOTE — H&P (Addendum)
History and Physical    [name] BDZ:[id number] DOB: Sep 27, 1945 DOA: 01/13/2017  Referring MD/NP/PA:   PCP: Debbrah Alar, NP   Patient coming from:  The patient is coming from home.  At baseline, pt is independent for most of ADL.    Chief Complaint: right arm numbness and right facial droop and right upper quadrant abdominal pain  HPI: [name] is a 71 y.o. male with medical history significant of stroke with left sided numbness and left arm weakness, hypertension, hyperlipidemia, GERD, hypothyroidism, gout, OSA, CAD, s/p of CABG, dCHF, PAF on Coumadin, who presents with right arm numbness in the right facial droop.  Pt states that he has history of stroke with left arm weakness and left sided numbness. He developed left arm numbness at about 11:00, lasting an hour and now resolved. Per report, pt was seen at a-fib clinic earlier today, where he was noted to have right arm numbness, difficulty walking with right leg not functioning as well, staggering gait and thinking/talking slower. He was also noted to have right facial droop. He was sent pt to ED for further evaluation and treatment. Patient states that he has had right upper quadrant abdominal pain today, which is constant, 5 out of 10 in severity, nonradiating. He denies nausea, vomiting or diarrhea. No fever or chills. Patient does not have chest pain, shortness breath, cough. No symptoms of UTI.  ED Course: pt was found to have  WBC 5.8, INR 1.75, negative troponin, lipase 28, normal LFT, electrolytes renal function okay, temperature normal, no tachycardia, no tachypnea, oxygen saturation 93% on room air, CT head is negative for acute intracranial abnormalities. Chest x-ray negative for infiltration, but showed right hemidiaphragm elevation. Patient is admitted to telemetry bed as inpatient. Neurology, Dr. Leonel Ramsay was consulted.   Review of Systems:   General: no fevers, chills, no body weight gain, has  fatigue HEENT: no blurry vision, hearing changes or sore throat Respiratory: no dyspnea, coughing, wheezing CV: no chest pain, no palpitations GI: no nausea, vomiting, has RUQ abdominal pain, no diarrhea, constipation GU: no dysuria, burning on urination, increased urinary frequency, hematuria  Ext: no leg edema Neuro: has new right arm numbness and right sided weakness, left facial droop. Has old left left sided numbness and the left arm weakness. Skin: no rash, no skin tear. MSK: No muscle spasm, no deformity, no limitation of range of movement in spin Heme: No easy bruising.  Travel history: No recent long distant travel.  Allergy: No Known Allergies  Past Medical History:  Diagnosis Date  . CAD (coronary artery disease)    s/p CABG, s/p DES to LCX  January 2011  . Chronic pain    left sided-[place]  . CVA (cerebral infarction)    left hemiparesis  . Diastolic CHF ([place])   . Dysrhythmia    hx of atrial fibrilation with cardioversion  . GERD (gastroesophageal reflux disease)   . Gout   . Hyperlipidemia   . Hypertension   . Hypothyroidism   . OSA on CPAP   . Sleep apnea   . Stroke ([place]) 1996  . Tubular adenoma of colon 05/2010    Past Surgical History:  Procedure Laterality Date  . CARDIAC CATHETERIZATION N/A 10/25/2015   Procedure: Left Heart Cath and Cors/Grafts Angiography;  Surgeon: Troy Sine, MD;  Location: [facility];  Service: Cardiovascular;  Laterality: N/A;  . CARDIOVERSION  06/20/2011   Procedure: CARDIOVERSION;  Surgeon: Larey Dresser, MD;  Location:  [facility];  Service: Cardiovascular;  Laterality: N/A;  . CARDIOVERSION N/A 04/26/2015   Procedure: CARDIOVERSION;  Surgeon: Dorothy Spark, MD;  Location: [facility];  Service: Cardiovascular;  Laterality: N/A;  . CARDIOVERSION N/A 05/10/2015   Procedure: CARDIOVERSION;  Surgeon: Larey Dresser, MD;  Location: [facility];  Service: Cardiovascular;  Laterality: N/A;  . CATARACT EXTRACTION   05/2010   left eye  . CATARACT EXTRACTION  04/2010   right eye  . CORONARY ARTERY BYPASS GRAFT     stent  . ESOPHAGOGASTRODUODENOSCOPY  03-25-2005  . NASAL SEPTUM SURGERY    . PERCUTANEOUS PLACEMENT INTRAVASCULAR STENT CERVICAL CAROTID ARTERY     03-2009; using a drug-eluting platform of the circumflex cornoray artery with a 3.0 x 18 [employer] Promus drug-eluting platform post dilated to 3.75 with a noncompliant balloon.  . TEE WITHOUT CARDIOVERSION  06/20/2011   Procedure: TRANSESOPHAGEAL ECHOCARDIOGRAM (TEE);  Surgeon: Larey Dresser, MD;  Location: [facility];  Service: Cardiovascular;  Laterality: N/A;    Social History:  reports that he has never smoked. He has never used smokeless tobacco. He reports that he does not drink alcohol or use drugs.  Family History:  Family History  Problem Relation Age of Onset  . Lung cancer Father        deceased  . Stroke Mother        deceased-MINISTROKES     Prior to Admission medications   Medication Sig Start Date End Date Taking? Authorizing Provider  albuterol (PROVENTIL HFA;VENTOLIN HFA) 108 (90 Base) MCG/ACT inhaler Inhale 1-2 puffs into the lungs every 6 (six) hours as needed for wheezing or shortness of breath.    Yes [provider]  allopurinol (ZYLOPRIM) 100 MG tablet TAKE 2 TABLETS EVERY DAY Patient taking differently: Take 200 mg by mouth in the morning 01/13/17  Yes Debbrah Alar, NP  atorvastatin (LIPITOR) 40 MG tablet TAKE 2 TABLETS (80 MG TOTAL) BY MOUTH DAILY. Patient taking differently: Take 80 mg by mouth at bedtime.  04/29/16  Yes Weaver, Scott T, PA-C  benazepril (LOTENSIN) 10 MG tablet Take 1 tablet (10 mg total) by mouth daily. Patient taking differently: Take 10 mg by mouth every morning. IN CONJUNCTION WITH ONE 5 MG. TABLET TO EQUAL A TOTAL DOSE OF 15 MILLIGRAMS 08/25/16  Yes End, Harrell Gave, MD  benazepril (LOTENSIN) 5 MG tablet Take 1 tablet (5 mg total) by mouth daily. Take 1 tablet daily with  the 10 mg tablet of Benazepril = 15 mg daily Patient taking differently: Take 5 mg by mouth every morning. IN CONJUNCTION WITH ONE 10 MG. TABLET TO EQUAL A TOTAL DOSE OF 15 MILLIGRAMS 09/18/16  Yes End, Harrell Gave, MD  fluticasone (FLONASE) 50 MCG/ACT nasal spray Place 2 sprays into both nostrils daily. Patient taking differently: Place 2 sprays into both nostrils daily as needed for allergies.  06/02/14  Yes Brunetta Jeans, PA-C  furosemide (LASIX) 40 MG tablet Take 1 tablet (40 mg total) by mouth every other day. 12/17/16  Yes Debbrah Alar, NP  isosorbide mononitrate (IMDUR) 30 MG 24 hr tablet Take 0.5 tablets (15 mg total) by mouth daily. Patient taking differently: Take 15 mg by mouth every morning.  11/05/15  Yes Weaver, Scott T, PA-C  lactulose (CHRONULAC) 10 GM/15ML solution Take 15 mLs (10 g total) by mouth daily as needed for mild constipation. 07/16/16  Yes Debbrah Alar, NP  levothyroxine (SYNTHROID, LEVOTHROID) 112 MCG tablet TAKE 1 TABLET BY MOUTH DAILY BEFORE BREAKFAST. Patient taking differently:  Take 112 mcg by mouth in the morning before breakfast 12/15/16  Yes Debbrah Alar, NP  metFORMIN (GLUCOPHAGE) 500 MG tablet Take 1 tablet (500 mg total) by mouth daily with breakfast. Patient taking differently: Take 500 mg by mouth 3 (three) times a week.  05/21/16  Yes Debbrah Alar, NP  Multiple Vitamins-Minerals (MULTIVITAMIN GUMMIES MENS PO) Take 1 tablet by mouth daily with breakfast.    Yes [provider]  nitroGLYCERIN (NITROSTAT) 0.4 MG SL tablet Place 1 tablet (0.4 mg total) under the tongue every 5 (five) minutes as needed for chest pain. Up to 3 doses 03/05/15  Yes Debbrah Alar, NP  OXYGEN Inhale 2 L into the lungs at bedtime.   Yes [provider]  pantoprazole (PROTONIX) 40 MG tablet Take 1 tablet (40 mg total) by mouth daily. Patient taking differently: Take 40 mg by mouth every morning.  10/09/16  Yes Debbrah Alar, NP    traMADol (ULTRAM) 50 MG tablet Take 1 tablet (50 mg total) by mouth 2 (two) times daily. Patient taking differently: Take 50 mg by mouth 2 (two) times daily. MORNING AND BEDTIME 12/30/16  Yes Kirsteins, Luanna Salk, MD  traMADol (ULTRAM-ER) 200 MG 24 hr tablet Take 1 tablet (200 mg total) by mouth daily. Patient taking differently: Take 200 mg by mouth every morning.  12/30/16  Yes Kirsteins, Luanna Salk, MD  vitamin B-12 1000 MCG tablet Take 1 tablet (1,000 mcg total) by mouth daily. 08/11/13  Yes Tat, Shanon Brow, MD  warfarin (COUMADIN) 2.5 MG tablet TAKE AS DIRECTED BY ANTICOAGULATION CLINIC Patient taking differently: Take 1.25 mg by mouth in the morning on Sun/Tues/Thurs/Sat and 2.5 mg on Mon/Wed/Fri 12/01/16  Yes Larey Dresser, MD    Physical Exam: Vitals:   01/13/17 2132 01/13/17 2245 01/13/17 2300 01/14/17 0000  BP:  (!) 157/89 (!) 171/94 (!) 151/83  Pulse:      Resp:  13  20  Temp: 98 F (36.7 C)     TempSrc:      SpO2:      Weight:      Height:       General: Not in acute distress HEENT:       Eyes: PERRL, EOMI, no scleral icterus.       ENT: No discharge from the ears and nose, no pharynx injection, no tonsillar enlargement.        Neck: No JVD, no bruit, no mass felt. Heme: No neck lymph node enlargement. Cardiac: S1/S2, RRR, No murmurs, No gallops or rubs. Respiratory: No rales, wheezing, rhonchi or rubs. GI: Soft, nondistended, has tenderness over RUQ, no rebound pain, no organomegaly, BS present. GU: No hematuria Ext: No pitting leg edema bilaterally. 2+DP/PT pulse bilaterally. Musculoskeletal: No joint deformities, No joint redness or warmth, no limitation of ROM in spin. Skin: No rashes.  Neuro: Alert, oriented X3, cranial nerves II-XII grossly intact. Muscle strength 1/5 left arm and 5/5 in other extremities, sensation to light touch decreased in left side of body and right arm. Brachial reflex 2+ bilaterally. Negative Babinski's sign. Psych: Patient is not psychotic, no  suicidal or hemocidal ideation.  Labs on Admission: I have personally reviewed following labs and imaging studies  CBC:  Recent Labs Lab 01/13/17 1522  WBC 5.8  NEUTROABS 2.9  HGB 13.8  HCT 41.6  MCV 91.0  PLT 962   Basic Metabolic Panel:  Recent Labs Lab 01/13/17 1522  NA 138  K 4.1  CL 103  CO2 25  GLUCOSE 98  BUN 15  CREATININE 1.11  CALCIUM 9.2   GFR: Estimated Creatinine Clearance: 82.1 mL/min (by C-G formula based on SCr of 1.11 mg/dL). Liver Function Tests:  Recent Labs Lab 01/13/17 1522  AST 32  ALT 31  ALKPHOS 60  BILITOT 0.8  PROT 7.1  ALBUMIN 4.0    Recent Labs Lab 01/13/17 2059  LIPASE 28   No results for input(s): AMMONIA in the last 168 hours. Coagulation Profile:  Recent Labs Lab 01/13/17 1522  INR 1.75   Cardiac Enzymes: No results for input(s): CKTOTAL, CKMB, CKMBINDEX, TROPONINI in the last 168 hours. BNP (last 3 results) No results for input(s): PROBNP in the last 8760 hours. HbA1C: No results for input(s): HGBA1C in the last 72 hours. CBG: No results for input(s): GLUCAP in the last 168 hours. Lipid Profile: No results for input(s): CHOL, HDL, LDLCALC, TRIG, CHOLHDL, LDLDIRECT in the last 72 hours. Thyroid Function Tests: No results for input(s): TSH, T4TOTAL, FREET4, T3FREE, THYROIDAB in the last 72 hours. Anemia Panel: No results for input(s): VITAMINB12, FOLATE, FERRITIN, TIBC, IRON, RETICCTPCT in the last 72 hours. Urine analysis:    Component Value Date/Time   COLORURINE AMBER (A) 12/12/2015 1524   APPEARANCEUR CLEAR 12/12/2015 1524   LABSPEC 1.036 (H) 12/12/2015 1524   PHURINE 5.0 12/12/2015 1524   GLUCOSEU 250 (A) 12/12/2015 1524   HGBUR NEGATIVE 12/12/2015 1524   BILIRUBINUR NEGATIVE 12/12/2015 1524   BILIRUBINUR Pos 08/16/2014 1655   KETONESUR NEGATIVE 12/12/2015 1524   PROTEINUR NEGATIVE 12/12/2015 1524   UROBILINOGEN 1.0 11/25/2014 1806   NITRITE NEGATIVE 12/12/2015 1524   LEUKOCYTESUR NEGATIVE  12/12/2015 1524   Sepsis Labs: @LABRCNTIP (procalcitonin:4,lacticidven:4) )No results found for this or any previous visit (from the past 240 hour(s)).   Radiological Exams on Admission: Dg Chest 2 View  Result Date: 01/13/2017 CLINICAL DATA:  Heartburn.  Right arm weakness. EXAM: CHEST  2 VIEW COMPARISON:  11/04/2016 CT.  10/16/2016 chest radiograph. FINDINGS: Prior median sternotomy. Artifact degraded lateral view posteriorly. Patient rotated to the right on the frontal radiograph. Prior median sternotomy. Moderate right hemidiaphragm elevation. Midline trachea. Mild cardiomegaly with a tortuous thoracic aorta. No pleural effusion or pneumothorax. No congestive failure. Clear lungs. IMPRESSION: No acute cardiopulmonary disease. Right hemidiaphragm elevation. Electronically Signed   By: Abigail Miyamoto M.D.   On: 01/13/2017 21:29   Ct Head Wo Contrast  Result Date: 01/13/2017 CLINICAL DATA:  71 year old male with right arm numbness beginning at 1230 hours today. Symptoms now resolved. EXAM: CT HEAD WITHOUT CONTRAST TECHNIQUE: Contiguous axial images were obtained from the base of the skull through the vertex without intravenous contrast. COMPARISON:  Head CT 02/20/2016.  Brain MRI 11/25/2014 and earlier. FINDINGS: Brain: Patchy and confluent bilateral cerebral white matter T2 and FLAIR hyperintensity including what on the prior MRI appeared to be a chronic lacunar type infarct of the right deep white matter capsules and lentiform nucleus. Stable cerebral volume since 2016. No midline shift, ventriculomegaly, mass effect, evidence of mass lesion, intracranial hemorrhage or evidence of cortically based acute infarction. Stable gray-white matter differentiation throughout the brain. Vascular: Mild Calcified atherosclerosis at the skull base. No suspicious intracranial vascular hyperdensity. Skull: No acute osseous abnormality identified. Sinuses/Orbits: Mild bilateral paranasal sinus mucosal thickening,  similar to the 2016 MRI. Tympanic cavities and mastoids are clear. Other: No acute orbit or scalp soft tissue findings. IMPRESSION: Advanced chronic small vessel disease appears stable since 2016. No acute intracranial abnormality. Electronically Signed   By: Herminio Heads.D.  On: 01/13/2017 15:39     EKG: Independently reviewed.  Sinus rhythm, QTC 455, LAD, early R-wave progression, nonspecific T-wave change  Assessment/Plan Principal Problem:   Stroke (cerebrum) (HCC) Active Problems:   Hypothyroidism   Hyperlipidemia   Essential hypertension   Hx of CABG '04   GERD   Type 2 diabetes mellitus without complication, without long-term current use of insulin ([place])   Long term current use of anticoagulant therapy   Chronic diastolic heart failure (HCC)   CAD S/P CFX DES 2011   Coronary artery disease due to lipid rich plaque   PAF (paroxysmal atrial fibrillation) (HCC)   RUQ abdominal pain   Stroke (cerebrum) ([place]): pt has history of stroke with left sided numbness, and the left arm weakness, but patient developed new right arm numbness and right-sided weakness, also right facial droop. Right facial droop has been persistent, indicating possible new stroke. CT head is negative for acute intracranial abnormalities. Neurology, Dr. Leonel Ramsay was consulted.  - will admit to tele bed as inpt - will follow up Neurology's Recs.  - Obtain MRI/MRA  - Check carotid dopplers  - will hold coumadin and add ASA 325 mg daily per Dr. Leonel Ramsay due to possible acute stroke - fasting lipid panel and HbA1c  - 2D transthoracic echocardiography  - PT/OT consult  PAF: CHA2DS2-VASc Score is 6, needs oral anticoagulation. Patient is on Coumadin at home. INR is 1.75 on admission. Heart rate is well controlled without using BB or CCB. -continue Coumadin per pharmacy  Chronic diastolic heart failure: 2-D echo on 04/26/15 showed EF of 50-55 percent. Patient does not have leg edema or JVD. No shortness of  breath. CHF is compensated -Continue home dose Lasix -check BNP  Hypothyroidism: Last TSH was 2.610 on 09/18/16 -Continue home Synthroid  Hx of CAD: s/p of CABG. No CP. -continue Lipitor, Imdur -When necessary nitroglycerin  HLD:  -lipitor  Essential hypertension: -continue lotensin -prn IV hydralazine -also on oral lasix for CHF  GERD: -Protonix  Type 2 diabetes mellitus without complication, without long-term current use of insulin ([place]): Last A1c 6.4 on 05/21/16, well controled. Patient is taking metformin at home -SSI -Check A1c  RUQ abdominal pain: Etiology is not clear. Lipase normal, LFTnormal.will need to rule out gallstone. No signs of infection. -prn Zofran for nausea, morphine for pain -Abdominal ultrasound-right upper quadrant   DVT ppx: sq heparin Code Status: Full code Family Communication:   Yes, patient's  daughterat bed side Disposition Plan:  Anticipate discharge back to previous home environment Consults called:  Neuro, Dr. Leonel Ramsay Admission status: Inpatient/tele    Date of Service 01/14/2017    Ivor Costa [facility] Pager [phone]  If 7PM-7AM, please contact night-coverage [email or web address] Password [email or web address] 01/14/2017, 12:15 AM

## 2017-01-13 NOTE — Progress Notes (Signed)
Primary Care Physician: Debbrah Alar, NP Referring Physician:Dr. Cathie Olden Cardiologist: Dr. Terri Piedra is a 71 y.o. male with a h/o CAD, prior CVA, HF, HTN, OSA on cpap for OSA in the afib clinc for f/u from recent ER visit.Marland Kitchen He has h/o afib, maintaining SR on amiodarone since 2026. He has developed recent shortness of breath for which he presented to the ER. It was decided that amiodarone may be the etiology for shortness of breath and stopped.  I saw pt last in September 2018 at which time he was staying in sinus rhythm.  Pt showed up in clinic stating that he did not feel well since this am and thought he may be back in afib. EKG shows SR at 75 bpm. Upon questioning he feels his rt arm to be numb since this am, and his rt leg was not functioning as well as usual. He initially was walking to clinic but he appeared to be staggering so a w/c was offered.He has had an old stoke that involved his left side. His thinking/talking appear to be a little slower today as well. He states that something is not right. States that he is taking his warfarin.  Today, he denies symptoms of palpitations, chest pain, , orthopnea, PND, lower extremity edema, dizziness, presyncope, syncope, or neurologic sequela. Positive for shortness of breath, improved. The patient is tolerating medications without difficulties and is otherwise without complaint today.   Past Medical History:  Diagnosis Date  . CAD (coronary artery disease)    s/p CABG, s/p DES to LCX  January 2011  . Chronic pain    left sided-Kristeins  . CVA (cerebral infarction)    left hemiparesis  . Diastolic CHF ([place])   . Dysrhythmia    hx of atrial fibrilation with cardioversion  . GERD (gastroesophageal reflux disease)   . Gout   . Hyperlipidemia   . Hypertension   . Hypothyroidism   . OSA on CPAP   . Sleep apnea   . Stroke ([place]) 1996  . Tubular adenoma of colon 05/2010   Past Surgical History:  Procedure Laterality  Date  . CARDIAC CATHETERIZATION N/A 10/25/2015   Procedure: Left Heart Cath and Cors/Grafts Angiography;  Surgeon: Troy Sine, MD;  Location: [place] [facility];  Service: Cardiovascular;  Laterality: N/A;  . CARDIOVERSION  06/20/2011   Procedure: CARDIOVERSION;  Surgeon: Larey Dresser, MD;  Location: [facility] ENDOSCOPY;  Service: Cardiovascular;  Laterality: N/A;  . CARDIOVERSION N/A 04/26/2015   Procedure: CARDIOVERSION;  Surgeon: Dorothy Spark, MD;  Location: [facility] ENDOSCOPY;  Service: Cardiovascular;  Laterality: N/A;  . CARDIOVERSION N/A 05/10/2015   Procedure: CARDIOVERSION;  Surgeon: Larey Dresser, MD;  Location: [place];  Service: Cardiovascular;  Laterality: N/A;  . CATARACT EXTRACTION  05/2010   left eye  . CATARACT EXTRACTION  04/2010   right eye  . CORONARY ARTERY BYPASS GRAFT     stent  . ESOPHAGOGASTRODUODENOSCOPY  03-25-2005  . NASAL SEPTUM SURGERY    . PERCUTANEOUS PLACEMENT INTRAVASCULAR STENT CERVICAL CAROTID ARTERY     03-2009; using a drug-eluting platform of the circumflex cornoray artery with a 3.0 x 18 [employer] Promus drug-eluting platform post dilated to 3.75 with a noncompliant balloon.  . TEE WITHOUT CARDIOVERSION  06/20/2011   Procedure: TRANSESOPHAGEAL ECHOCARDIOGRAM (TEE);  Surgeon: Larey Dresser, MD;  Location: [facility] ENDOSCOPY;  Service: Cardiovascular;  Laterality: N/A;    Current Outpatient Prescriptions  Medication Sig Dispense Refill  .  allopurinol (ZYLOPRIM) 100 MG tablet TAKE 2 TABLETS EVERY DAY 180 tablet 1  . atorvastatin (LIPITOR) 40 MG tablet TAKE 2 TABLETS (80 MG TOTAL) BY MOUTH DAILY. 180 tablet 3  . benazepril (LOTENSIN) 10 MG tablet Take 1 tablet (10 mg total) by mouth daily. 135 tablet 1  . benazepril (LOTENSIN) 5 MG tablet Take 1 tablet (5 mg total) by mouth daily. Take 1 tablet daily with the 10 mg tablet of Benazepril = 15 mg daily 90 tablet 3  . fluticasone (FLONASE) 50 MCG/ACT nasal spray Place 2 sprays into both nostrils daily. 9.9 g  1  . furosemide (LASIX) 40 MG tablet Take 1 tablet (40 mg total) by mouth every other day. 45 tablet 1  . isosorbide mononitrate (IMDUR) 30 MG 24 hr tablet Take 0.5 tablets (15 mg total) by mouth daily. 30 tablet 6  . lactulose (CHRONULAC) 10 GM/15ML solution Take 15 mLs (10 g total) by mouth daily as needed for mild constipation. 240 mL 1  . levothyroxine (SYNTHROID, LEVOTHROID) 112 MCG tablet TAKE 1 TABLET BY MOUTH DAILY BEFORE BREAKFAST. 90 tablet 0  . metFORMIN (GLUCOPHAGE) 500 MG tablet Take 1 tablet (500 mg total) by mouth daily with breakfast. 90 tablet 1  . Multiple Vitamins-Minerals (MULTIVITAMIN GUMMIES MENS PO) Take 1 tablet by mouth daily.    . nitroGLYCERIN (NITROSTAT) 0.4 MG SL tablet Place 1 tablet (0.4 mg total) under the tongue every 5 (five) minutes as needed for chest pain. Up to 3 doses 10 tablet 2  . pantoprazole (PROTONIX) 40 MG tablet Take 1 tablet (40 mg total) by mouth daily. 90 tablet 0  . traMADol (ULTRAM) 50 MG tablet Take 1 tablet (50 mg total) by mouth 2 (two) times daily. 180 tablet 0  . traMADol (ULTRAM-ER) 200 MG 24 hr tablet Take 1 tablet (200 mg total) by mouth daily. 90 tablet 0  . vitamin B-12 1000 MCG tablet Take 1 tablet (1,000 mcg total) by mouth daily. 30 tablet 0  . warfarin (COUMADIN) 2.5 MG tablet TAKE AS DIRECTED BY ANTICOAGULATION CLINIC 100 tablet 1  . albuterol (PROVENTIL HFA;VENTOLIN HFA) 108 (90 Base) MCG/ACT inhaler Inhale into the lungs every 6 (six) hours as needed for wheezing or shortness of breath.     No current facility-administered medications for this encounter.     No Known Allergies  Social History   Social History  . Marital status: Married    Spouse name: [name]  . Number of children: N/A  . Years of education: 5   Occupational History  . retired Hydrographic surveyor   Social History Main Topics  . Smoking status: Never Smoker  . Smokeless tobacco: Never Used  . Alcohol use No     Comment: 1 beer a month  .  Drug use: No  . Sexual activity: No   Other Topics Concern  . Not on file   Social History Narrative   Pt lives with wife. Does have stairs, but patient doesn't use them. Pt has completed technical school    Family History  Problem Relation Age of Onset  . Lung cancer Father        deceased  . Stroke Mother        deceased-MINISTROKES    ROS- All systems are reviewed and negative except as per the HPI above  Physical Exam: Vitals:   01/13/17 1431  BP: (!) 154/76  Pulse: 75  Weight: 261 lb (118.4 kg)  Height: 6\' 3"  (1.905  m)   Wt Readings from Last 3 Encounters:  01/13/17 261 lb (118.4 kg)  12/09/16 258 lb (117 kg)  12/05/16 257 lb 12.8 oz (116.9 kg)    Labs: Lab Results  Component Value Date   NA 140 10/16/2016   K 4.5 10/16/2016   CL 103 10/16/2016   CO2 27 10/16/2016   GLUCOSE 134 (H) 10/16/2016   BUN 17 10/16/2016   CREATININE 1.14 10/16/2016   CALCIUM 9.5 10/16/2016   PHOS 3.5 04/25/2015   MG 2.1 10/22/2015   Lab Results  Component Value Date   INR 2.4 11/24/2016   Lab Results  Component Value Date   CHOL 138 12/05/2016   HDL 46 12/05/2016   LDLCALC 67 12/05/2016   TRIG 123 12/05/2016     GEN- The patient is well appearing, alert and oriented x 3 today.   Head- normocephalic, atraumatic Eyes-  Sclera clear, conjunctiva pink Ears- hearing intact Oropharynx- clear Neck- supple, no JVP Lymph- no cervical lymphadenopathy Lungs- Clear to ausculation bilaterally, normal work of breathing Heart- Regular rate and rhythm, no murmurs, rubs or gallops, PMI not laterally displaced GI- soft, NT, ND, + BS Extremities- no clubbing, cyanosis, or edema MS- no significant deformity or atrophy Skin- no rash or lesion Psych- euthymic mood, full affect Neuro- strength and sensation are intact  EKG- NSR at 75 bpm, pr int 160 ms, qrs int 94 ms, qtc 455 ms  Epic records reviewed  Chest CT-IMPRESSION: 1. No evidence of interstitial lung disease. 2. 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  Aortic Atherosclerosis (ICD10-I70.0).    Assessment and Plan:   1. Afib Maintaining SR Off amiodarone for months for fear of worsening lung disease/shortness of breath Continues on warfarin, last INR in EPIC 8/2 2.52   2.Rt arm tingling with rt leg not functioning as well since early am with staggering walk and slow speech/thought process TO ER for possible stroke  via w/c  [name], [facility] [address] [place], [place] [address] [phone]

## 2017-01-14 ENCOUNTER — Inpatient Hospital Stay (HOSPITAL_COMMUNITY): Payer: Medicare Other

## 2017-01-14 ENCOUNTER — Encounter (HOSPITAL_COMMUNITY): Payer: Self-pay

## 2017-01-14 DIAGNOSIS — I5032 Chronic diastolic (congestive) heart failure: Secondary | ICD-10-CM

## 2017-01-14 DIAGNOSIS — I6300 Cerebral infarction due to thrombosis of unspecified precerebral artery: Secondary | ICD-10-CM | POA: Diagnosis not present

## 2017-01-14 DIAGNOSIS — R531 Weakness: Secondary | ICD-10-CM | POA: Diagnosis not present

## 2017-01-14 DIAGNOSIS — R262 Difficulty in walking, not elsewhere classified: Secondary | ICD-10-CM | POA: Diagnosis not present

## 2017-01-14 DIAGNOSIS — I10 Essential (primary) hypertension: Secondary | ICD-10-CM

## 2017-01-14 DIAGNOSIS — I6789 Other cerebrovascular disease: Secondary | ICD-10-CM | POA: Diagnosis not present

## 2017-01-14 DIAGNOSIS — G459 Transient cerebral ischemic attack, unspecified: Secondary | ICD-10-CM | POA: Diagnosis not present

## 2017-01-14 DIAGNOSIS — R1011 Right upper quadrant pain: Secondary | ICD-10-CM | POA: Diagnosis not present

## 2017-01-14 LAB — LIPID PANEL
CHOL/HDL RATIO: 3 ratio
CHOLESTEROL: 134 mg/dL (ref 0–200)
HDL: 45 mg/dL (ref >40)
LDL CALC: 62 mg/dL (ref 0–99)
TRIGLYCERIDES: 136 mg/dL (ref <150)
VLDL: 27 mg/dL (ref 0–40)

## 2017-01-14 LAB — BRAIN NATRIURETIC PEPTIDE: B Natriuretic Peptide: 111.5 pg/mL — ABNORMAL HIGH (ref 0.0–100.0)

## 2017-01-14 LAB — ECHOCARDIOGRAM COMPLETE
Ao-asc: 32 cm
CHL CUP MV DEC (S): 335
E/e' ratio: 5.31
EWDT: 335 ms
FS: 25 % — AB (ref 28–44)
Height: 75 in
IV/PV OW: 1.51
LA vol index: 16.8 mL/m2
LA vol: 42.4 mL
LADIAMINDEX: 1.7 cm/m2
LASIZE: 43 mm
LAVOLA4C: 40.8 mL
LDCA: 2.54 cm2
LEFT ATRIUM END SYS DIAM: 43 mm
LV E/e' medial: 5.31
LV E/e'average: 5.31
LV PW d: 9.16 mm — AB (ref 0.6–1.1)
LV TDI E'MEDIAL: 8.7
LVELAT: 9.68 cm/s
LVOTD: 18 mm
MV pk A vel: 70.3 m/s
MV pk E vel: 51.4 m/s
RV LATERAL S' VELOCITY: 9.9 cm/s
Reg peak vel: 217 cm/s
TAPSE: 15.3 mm
TDI e' lateral: 9.68
TRMAXVEL: 217 cm/s
Weight: 4160 oz

## 2017-01-14 LAB — GLUCOSE, CAPILLARY
GLUCOSE-CAPILLARY: 99 mg/dL (ref 65–99)
Glucose-Capillary: 127 mg/dL — ABNORMAL HIGH (ref 65–99)
Glucose-Capillary: 141 mg/dL — ABNORMAL HIGH (ref 65–99)

## 2017-01-14 LAB — HEMOGLOBIN A1C
Hgb A1c MFr Bld: 6.2 % — ABNORMAL HIGH (ref 4.8–5.6)
Mean Plasma Glucose: 131.24 mg/dL

## 2017-01-14 MED ORDER — PERFLUTREN LIPID MICROSPHERE
1.0000 mL | INTRAVENOUS | Status: AC | PRN
  Filled 2017-01-14: qty 10

## 2017-01-14 MED ORDER — HEPARIN SODIUM (PORCINE) 5000 UNIT/ML IJ SOLN
5000.0000 [IU] | Freq: Three times a day (TID) | INTRAMUSCULAR | Status: DC
  Administered 2017-01-14: 5000 [IU] via SUBCUTANEOUS
  Filled 2017-01-14: qty 1

## 2017-01-14 MED ORDER — PANTOPRAZOLE SODIUM 40 MG PO TBEC
40.0000 mg | DELAYED_RELEASE_TABLET | Freq: Every day | ORAL | Status: DC
  Administered 2017-01-14: 40 mg via ORAL
  Filled 2017-01-14: qty 1

## 2017-01-14 MED ORDER — NITROGLYCERIN 0.4 MG SL SUBL: 0.4000 mg | SUBLINGUAL_TABLET | SUBLINGUAL | Status: DC | PRN

## 2017-01-14 MED ORDER — TRAMADOL HCL 50 MG PO TABS
50.0000 mg | ORAL_TABLET | Freq: Four times a day (QID) | ORAL | Status: DC | PRN
  Administered 2017-01-14: 50 mg via ORAL
  Filled 2017-01-14: qty 1

## 2017-01-14 MED ORDER — ALBUTEROL SULFATE (2.5 MG/3ML) 0.083% IN NEBU: 2.5000 mg | INHALATION_SOLUTION | RESPIRATORY_TRACT | Status: DC | PRN

## 2017-01-14 MED ORDER — TRAMADOL HCL 50 MG PO TABS: 150.0000 mg | ORAL_TABLET | Freq: Two times a day (BID) | ORAL | Status: DC

## 2017-01-14 MED ORDER — BENAZEPRIL HCL 10 MG PO TABS: 10.0000 mg | ORAL_TABLET | Freq: Every day | ORAL | Status: DC

## 2017-01-14 MED ORDER — ADULT MULTIVITAMIN W/MINERALS CH
1.0000 | ORAL_TABLET | Freq: Every day | ORAL | Status: DC
  Administered 2017-01-14: 1 via ORAL
  Filled 2017-01-14: qty 1

## 2017-01-14 MED ORDER — ISOSORBIDE MONONITRATE ER 30 MG PO TB24
15.0000 mg | ORAL_TABLET | Freq: Every day | ORAL | Status: DC
  Administered 2017-01-14: 15 mg via ORAL
  Filled 2017-01-14: qty 1

## 2017-01-14 MED ORDER — FUROSEMIDE 40 MG PO TABS: 40.0000 mg | ORAL_TABLET | ORAL | Status: DC

## 2017-01-14 MED ORDER — PERFLUTREN LIPID MICROSPHERE
INTRAVENOUS | Status: AC
  Administered 2017-01-14: 14:00:00
  Filled 2017-01-14: qty 10

## 2017-01-14 MED ORDER — LACTULOSE 10 GM/15ML PO SOLN: 10.0000 g | Freq: Every day | ORAL | Status: DC | PRN

## 2017-01-14 MED ORDER — VITAMIN B-12 1000 MCG PO TABS
1000.0000 ug | ORAL_TABLET | Freq: Every day | ORAL | Status: DC
  Administered 2017-01-14: 1000 ug via ORAL
  Filled 2017-01-14: qty 1

## 2017-01-14 MED ORDER — WARFARIN - PHARMACIST DOSING INPATIENT: Freq: Every day | Status: DC

## 2017-01-14 MED ORDER — ASPIRIN 325 MG PO TABS
325.0000 mg | ORAL_TABLET | Freq: Every day | ORAL | Status: DC
  Administered 2017-01-14: 325 mg via ORAL
  Filled 2017-01-14: qty 1

## 2017-01-14 MED ORDER — ALLOPURINOL 100 MG PO TABS
200.0000 mg | ORAL_TABLET | Freq: Every day | ORAL | Status: DC
  Administered 2017-01-14: 200 mg via ORAL
  Filled 2017-01-14: qty 2

## 2017-01-14 MED ORDER — BENAZEPRIL HCL 5 MG PO TABS
15.0000 mg | ORAL_TABLET | Freq: Every day | ORAL | Status: DC
  Administered 2017-01-14: 15 mg via ORAL
  Filled 2017-01-14: qty 3

## 2017-01-14 MED ORDER — ATORVASTATIN CALCIUM 80 MG PO TABS
80.0000 mg | ORAL_TABLET | Freq: Every day | ORAL | Status: DC
  Administered 2017-01-14: 80 mg via ORAL
  Filled 2017-01-14: qty 1

## 2017-01-14 MED ORDER — WARFARIN SODIUM 4 MG PO TABS
4.0000 mg | ORAL_TABLET | Freq: Once | ORAL | Status: DC
  Filled 2017-01-14: qty 1

## 2017-01-14 MED ORDER — ASPIRIN 325 MG PO TABS: 325.0000 mg | ORAL_TABLET | Freq: Every day | ORAL | 0 refills | Status: DC

## 2017-01-14 MED ORDER — LEVOTHYROXINE SODIUM 112 MCG PO TABS
112.0000 ug | ORAL_TABLET | Freq: Every day | ORAL | Status: DC
  Administered 2017-01-14: 112 ug via ORAL
  Filled 2017-01-14: qty 1

## 2017-01-14 MED ORDER — FLUTICASONE PROPIONATE 50 MCG/ACT NA SUSP
2.0000 | Freq: Every day | NASAL | Status: DC
  Administered 2017-01-14: 2 via NASAL
  Filled 2017-01-14: qty 16

## 2017-01-14 NOTE — Evaluation (Signed)
Occupational Therapy Evaluation Patient Details Name: [name] MRN: [id number] DOB: July 08, 1945 Today's Date: 01/14/2017    History of Present Illness Pt is a 71 y.o.malewith medical history significant of stroke with left sided numbness and left arm weakness, hypertension, hyperlipidemia, GERD, hypothyroidism, gout, OSA, CAD, s/p of CABG, dCHF, PAF on Coumadin, who presents with right arm numbness in the right facial droop. 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    Clinical Impression   Pt reports he managed ADL independently PTA with the exception of min assist from his wife for donning compression socks. Currently pt requires min assist overall for ADL and functional mobility with the exception of mod assist for LB ADL. Pt presenting with impaired balance, bil UE decreased shoulder ROM and sensation changes, cognitive deficits, and decreased awareness of deficits/safety impacting his independence and safety with ADL and functional mobility. Recommending CIR level therapies for follow up to maximize independence and safety with ADL and functional mobility prior to return home. Pt would benefit from continued skilled OT to address established goals.    Follow Up Recommendations  CIR;Supervision/Assistance - 24 hour (if not CIR; would benefit from [facility])    Equipment Recommendations  None recommended by OT (may benefit from sock aide for donning compression socks)    Recommendations for Other Services       Precautions / Restrictions Precautions Precautions: Fall Restrictions Weight Bearing Restrictions: No      Mobility Bed Mobility Overal bed mobility: Needs Assistance Bed Mobility: Supine to Sit     Supine to sit: Min guard     General bed mobility comments: increased time and effort to direct to task and come to EOB  Transfers Overall transfer level:  Needs assistance Equipment used: None Transfers: Sit to/from Stand Sit to Stand: Min assist         General transfer comment: Min assist for stability to power up, noted RLE instability in standing, rigid LLE    Balance Overall balance assessment: Needs assistance Sitting-balance support: Feet supported Sitting balance-Leahy Scale: Good Sitting balance - Comments: able to sit EOB and perform dynamic reach outside of base of support   Standing balance support: During functional activity Standing balance-Leahy Scale: Fair                          ADL either performed or assessed with clinical judgement   ADL Overall ADL's : Needs assistance/impaired Eating/Feeding: Set up;Sitting   Grooming: Minimal assistance;Standing;Brushing hair Grooming Details (indicate cue type and reason): min assist for balance Upper Body Bathing: Minimal assistance;Sitting   Lower Body Bathing: Moderate assistance;Sit to/from stand   Upper Body Dressing : Minimal assistance;Sitting Upper Body Dressing Details (indicate cue type and reason): to don gown Lower Body Dressing: Moderate assistance;Sit to/from stand Lower Body Dressing Details (indicate cue type and reason): Difficulty adjusting socks in sitting. Assist for balance in standing Toilet Transfer: Minimal assistance;Ambulation Toilet Transfer Details (indicate cue type and reason): Simulated by sit to stand from EOB with functional mobility in room         Functional mobility during ADLs: Minimal assistance General ADL Comments: Discussed follow up rehab; pt reluctant but agreeable     Vision Baseline Vision/History: Wears glasses Wears Glasses: At all times Patient Visual Report: No change from baseline Additional Comments: Needs further assessment.     Perception     Praxis  Pertinent Vitals/Pain Pain Assessment: Faces Faces Pain Scale: No hurt     Hand Dominance Left (but uses R for functional tasks due to  prior CVA)   Extremity/Trunk Assessment Upper Extremity Assessment Upper Extremity Assessment: RUE deficits/detail;LUE deficits/detail RUE Deficits / Details: Decreased ROM at shoulder (FF ~100 degrees). Pt reports impaired sensation but better than L side. RUE Sensation: decreased light touch RUE Coordination: decreased fine motor;decreased gross motor LUE Deficits / Details: Shoulder FF to 90. Sensory changes reported. Present from prior PTA without change LUE Sensation: decreased light touch (from previous stroke) LUE Coordination: decreased fine motor;decreased gross motor (from previous stroke)   Lower Extremity Assessment Lower Extremity Assessment: Defer to PT evaluation    Cervical / Trunk Assessment Cervical / Trunk Assessment: Kyphotic   Communication Communication Communication: Expressive difficulties   Cognition Arousal/Alertness: Awake/alert Behavior During Therapy: WFL for tasks assessed/performed Overall Cognitive Status: Impaired/Different from baseline Area of Impairment: Attention;Memory;Awareness;Problem solving                   Current Attention Level: Sustained Memory: Decreased short-term memory     Awareness: Emergent Problem Solving: Slow processing;Requires verbal cues General Comments: patient extremely tangential with max cues to direct to task, some noted word finding difficulties during assessment. Max cues to direct to task   General Comments       Exercises     Shoulder Instructions      Home Living Family/patient expects to be discharged to:: Private residence Living Arrangements: Spouse/significant other Available Help at Discharge: Family Type of Home: House Home Access: Stairs to enter Technical brewer of Steps: 3 Entrance Stairs-Rails: Left Home Layout: One level     Bathroom Shower/Tub: Occupational psychologist: Handicapped height     [place]: [place] - built in;Walker - 2 wheels;Cane -  single point;Walker - 4 wheels      Lives With: Spouse;Family    Prior Functioning/Environment Level of Independence: Independent;Independent with assistive device(s)        Comments: mows his lawn with a zero turn lawnmower        OT Problem List: Decreased strength;Decreased range of motion;Decreased activity tolerance;Impaired balance (sitting and/or standing);Decreased cognition;Decreased safety awareness;Decreased knowledge of use of DME or AE;Impaired sensation;Obesity;Impaired UE functional use      OT Treatment/Interventions: Self-care/ADL training;Therapeutic exercise;Neuromuscular education;Energy conservation;DME and/or AE instruction;Therapeutic activities;Patient/family education;Balance training;Cognitive remediation/compensation    OT Goals(Current goals can be found in the care plan section) Acute Rehab OT Goals Patient Stated Goal: to go home OT Goal Formulation: With patient Time For Goal Achievement: 01/28/17 Potential to Achieve Goals: Good ADL Goals Pt Will Perform Grooming: with supervision;standing Pt Will Perform Upper Body Bathing: with set-up;sitting Pt Will Perform Lower Body Bathing: with supervision;sit to/from stand Pt Will Transfer to Toilet: with supervision;ambulating;regular height toilet Pt Will Perform Toileting - Clothing Manipulation and hygiene: with supervision;sit to/from stand Pt Will Perform Tub/Shower Transfer: Shower transfer;with supervision;ambulating;shower seat  OT Frequency: Min 2X/week   Barriers to D/C:            Co-evaluation PT/OT/SLP Co-Evaluation/Treatment: Yes Reason for Co-Treatment: Complexity of the patient's impairments (multi-system involvement);For patient/therapist safety;To address functional/ADL transfers;Necessary to address cognition/behavior during functional activity PT goals addressed during session: Mobility/safety with mobility;Balance OT goals addressed during session: ADL's and self-care       AM-PAC PT "6 Clicks" Daily Activity     Outcome Measure Help from another person eating meals?: A Little Help from another person  taking care of personal grooming?: A Little Help from another person toileting, which includes using toliet, bedpan, or urinal?: A Little Help from another person bathing (including washing, rinsing, drying)?: A Lot Help from another person to put on and taking off regular upper body clothing?: A Little Help from another person to put on and taking off regular lower body clothing?: A Lot 6 Click Score: 16   End of Session Equipment Utilized During Treatment: Gait belt Nurse Communication: Mobility status  Activity Tolerance: Patient tolerated treatment well Patient left: in chair;with call bell/phone within reach;with chair alarm set  OT Visit Diagnosis: Unsteadiness on feet (R26.81);Other abnormalities of gait and mobility (R26.89);Cognitive communication deficit (R41.841)                Time: 2482-5003 OT Time Calculation (min): 28 min Charges:  OT General Charges $OT Visit: 1 Visit OT Evaluation $OT Eval Moderate Complexity: 1 Mod G-Codes:     Kortney Schoenfelder A. Ulice Brilliant, M.S., OTR/L Pager: [place] 01/14/2017, 10:49 AM

## 2017-01-14 NOTE — Progress Notes (Signed)
  Echocardiogram 2D Echocardiogram has been performed.  Traveion Ruddock G Anselm Aumiller 01/14/2017, 1:58 PM

## 2017-01-14 NOTE — Consult Note (Signed)
Neurology Consultation Reason for Consult: Aphasia Referring Physician: Carmon Sails  CC: Aphasia  History is obtained from: Patient, daughter  HPI: [name] is a 71 y.o. male who noted that his right side became numb earlier today.  His daughter states that he has had difficulty speaking for at least 2 days.  He states that while he was at his Coumadin clinic today, he had right-sided numbness, he also had some trouble moving his right leg.  His daughter states that his memory has been very bad for the past few days, and he has been having some difficulty for at least a couple of days.   LKW: 10/28 tpa given?: no, out of window    ROS: A 14 point ROS was performed and is negative except as noted in the HPI.  Past Medical History:  Diagnosis Date  . CAD (coronary artery disease)    s/p CABG, s/p DES to LCX  January 2011  . Chronic pain    left sided-Kristeins  . CVA (cerebral infarction)    left hemiparesis  . Diastolic CHF ([place])   . Dysrhythmia    hx of atrial fibrilation with cardioversion  . GERD (gastroesophageal reflux disease)   . Gout   . Hyperlipidemia   . Hypertension   . Hypothyroidism   . OSA on CPAP   . Sleep apnea   . Stroke ([place]) 1996  . Tubular adenoma of colon 05/2010     Family History  Problem Relation Age of Onset  . Lung cancer Father        deceased  . Stroke Mother        deceased-MINISTROKES     Social History:  reports that he has never smoked. He has never used smokeless tobacco. He reports that he does not drink alcohol or use drugs.   Exam: Current vital signs: BP (!) 171/94   Pulse 70   Temp 98 F (36.7 C)   Resp 13   Ht 6\' 3"  (1.905 m)   Wt 117.9 kg (260 lb)   SpO2 95%   BMI 32.50 kg/m  Vital signs in last 24 hours: Temp:  [98 F (36.7 C)] 98 F (36.7 C) (10/30 2132) Pulse Rate:  [70-75] 70 (10/30 1933) Resp:  [13-18] 13 (10/30 2245) BP: (133-171)/(67-94) 171/94 (10/30 2300) SpO2:  [93 %-95 %] 95 % (10/30  1933) Weight:  [117.9 kg (260 lb)-118.4 kg (261 lb)] 117.9 kg (260 lb) (10/30 1455)   Physical Exam  Constitutional: Appears well-developed and well-nourished.  Psych: Affect appropriate to situation Eyes: No scleral injection HENT: No OP obstrucion Head: Normocephalic.  Cardiovascular: Normal rate and regular rhythm.  Respiratory: Effort normal and breath sounds normal to anterior ascultation GI: Soft.  No distension. There is no tenderness.  Skin: WDI  Neuro: Mental Status: Patient is awake, alert, oriented to person, place, month, year, and situation. Patient is able to give a clear and coherent history. He has a mild expressive aphasia, frequently gets frustrated. Cranial Nerves: II: Visual Fields are full. Pupils are equal, round, and reactive to light.   III,IV, VI: EOMI without ptosis or diploplia.  VII: Facial movement is diminished on the right VIII: hearing is intact to voice X: Uvula elevates symmetrically XI: Shoulder shrug is symmetric. XII: tongue is midline without atrophy or fasciculations.  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  He can give a 5/5 strength, but impaired control.  He has  a mild right arm pronator drift. Sensory: Sensation is decreased throughout the left hemibody Cerebellar: He is able to perform with his right hand well.  I have reviewed labs in epic and the results pertinent to this consultation are: CMP-unremarkable INR-1.75  I have reviewed the images obtained: CT head-unremarkable  Impression: 71 year old male with right-sided weakness and mild aphasia.  I strongly suspect that this represents a small infarct in the setting of subtherapeutic INR.  He will need further evaluation, however, to confirm that he does not have carotid stenosis, etc.  Is also the window for any type of intervention.  Recommendations: 1. HgbA1c, fasting lipid panel 2. MRI, MRA  of  the brain without contrast 3. Frequent neuro checks 4. Echocardiogram 5. Carotid dopplers 6. Prophylactic therapy-Antiplatelet med: Aspirin - dose 325mg  PO or 300mg  PR 7. Risk factor modification 8. Telemetry monitoring 9. PT consult, OT consult, Speech consult 10. please page stroke NP  Or  PA  Or MD  from 8am -4 pm as this patient will be followed by the stroke team at this point.   You can look them up on [email or web address]      Roland Rack, MD [facility] [phone]  If 7pm- 7am, please page neurology on call as listed in [place].

## 2017-01-14 NOTE — Evaluation (Signed)
Physical Therapy Evaluation Patient Details Name: [name] MRN: [id number] DOB: Oct 04, 1945 Today's Date: 01/14/2017   History of Present Illness  Pt is a 71 y.o.malewith medical history significant of stroke with left sided numbness and left arm weakness, hypertension, hyperlipidemia, GERD, hypothyroidism, gout, OSA, CAD, s/p of CABG, dCHF, PAF on Coumadin, who presents with right arm numbness in the right facial droop. 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   Clinical Impression  Orders received for PT evaluation. Patient demonstrates deficits in functional mobility as indicated below. Will benefit from continued skilled PT to address deficits and maximize function. Will see as indicated and progress as tolerated.  At this time, patient reports that he is very different from normal. Patient states that he usually is cutting his lawn on his zero turn but the past few days has been experiencing issues with both left and right side. Reports left deficits are baseline, but right side is new. Recommend CIR consult.    Follow Up Recommendations CIR;Supervision/Assistance - 24 hour    Equipment Recommendations  None recommended by PT    Recommendations for Other Services Rehab consult     Precautions / Restrictions Precautions Precautions: Fall Restrictions Weight Bearing Restrictions: No      Mobility  Bed Mobility Overal bed mobility: Needs Assistance Bed Mobility: Supine to Sit     Supine to sit: Min guard     General bed mobility comments: increased time and effort to direct to task and come to EOB  Transfers Overall transfer level: Needs assistance Equipment used: None Transfers: Sit to/from Stand Sit to Stand: Min assist         General transfer comment: Min assist for stability to power up, noted RLE instability in standing, rigid  LLE  Ambulation/Gait Ambulation/Gait assistance: Min assist Ambulation Distance (Feet): 16 Feet Assistive device: None (furniture surf to chair) Gait Pattern/deviations: Step-through pattern;Decreased stride length;Ataxic Gait velocity: decreased Gait velocity interpretation: Below normal speed for age/gender General Gait Details: noted LLE rigidity during movement and poor cooridation of LLE stride, min assist for stability, patient reaching for UE support  Stairs            Wheelchair Mobility    Modified Rankin (Stroke Patients Only) Modified Rankin (Stroke Patients Only) Pre-Morbid Rankin Score: Moderate disability Modified Rankin: Moderately severe disability     Balance Overall balance assessment: Needs assistance Sitting-balance support: Feet supported Sitting balance-Leahy Scale: Good Sitting balance - Comments: able to sit EOB and perform dynamic reach outside of base of support   Standing balance support: During functional activity Standing balance-Leahy Scale: Fair               High level balance activites: Turns High Level Balance Comments: min assist for turns, could not perform higher level tasks at this time             Pertinent Vitals/Pain Pain Assessment: Faces Faces Pain Scale: No hurt    Home Living Family/patient expects to be discharged to:: Private residence Living Arrangements: Spouse/significant other Available Help at Discharge: Family Type of Home: House Home Access: Stairs to enter Entrance Stairs-Rails: Left Entrance Stairs-Number of Steps: 3 Home Layout: One level Home Equipment: Shower seat - built in;Walker - 2 wheels;Cane - single point;Walker - 4 wheels      Prior Function Level of Independence: Independent;Independent with assistive device(s)         Comments: mows his  lawn with a zero turn lawnmower     Hand Dominance   Dominant Hand: Right    Extremity/Trunk Assessment   Upper Extremity  Assessment Upper Extremity Assessment: RUE deficits/detail;LUE deficits/detail LUE Sensation: decreased light touch (from previous stroke) LUE Coordination: decreased fine motor;decreased gross motor (from previous stroke)    Lower Extremity Assessment Lower Extremity Assessment: RLE deficits/detail;LLE deficits/detail RLE Deficits / Details: patient with difficulty performing functional coordination testing RLE Sensation: history of peripheral neuropathy RLE Coordination: decreased fine motor LLE Deficits / Details: left sided residual weakness from previous stroke LLE Sensation: decreased light touch;history of peripheral neuropathy (baseline from previous stroke) LLE Coordination: decreased fine motor;decreased gross motor (from previous stroke)       Communication   Communication: Expressive difficulties  Cognition Arousal/Alertness: Awake/alert Behavior During Therapy: WFL for tasks assessed/performed Overall Cognitive Status: Impaired/Different from baseline Area of Impairment: Attention;Memory;Awareness;Problem solving                   Current Attention Level: Sustained Memory: Decreased short-term memory     Awareness: Emergent Problem Solving: Slow processing;Requires verbal cues General Comments: patient extremely tangential with max cues to direct to task, some noted word finding difficulties during assessment. Max cues to direct to task      General Comments      Exercises     Assessment/Plan    PT Assessment Patient needs continued PT services  PT Problem List Decreased activity tolerance;Decreased balance;Decreased mobility;Decreased coordination;Decreased cognition       PT Treatment Interventions DME instruction;Gait training;Stair training;Functional mobility training;Therapeutic activities;Therapeutic exercise;Balance training;Cognitive remediation;Patient/family education    PT Goals (Current goals can be found in the Care Plan section)   Acute Rehab PT Goals Patient Stated Goal: to go home PT Goal Formulation: With patient Time For Goal Achievement: 01/28/17 Potential to Achieve Goals: Good    Frequency Min 4X/week   Barriers to discharge        Co-evaluation PT/OT/SLP Co-Evaluation/Treatment: Yes Reason for Co-Treatment: Complexity of the patient's impairments (multi-system involvement);For patient/therapist safety;Necessary to address cognition/behavior during functional activity;To address functional/ADL transfers PT goals addressed during session: Mobility/safety with mobility;Balance OT goals addressed during session: ADL's and self-care       AM-PAC PT "6 Clicks" Daily Activity  Outcome Measure Difficulty turning over in bed (including adjusting bedclothes, sheets and blankets)?: A Little Difficulty moving from lying on back to sitting on the side of the bed? : A Little Difficulty sitting down on and standing up from a chair with arms (e.g., wheelchair, bedside commode, etc,.)?: A Little Help needed moving to and from a bed to chair (including a wheelchair)?: A Little Help needed walking in hospital room?: A Little Help needed climbing 3-5 steps with a railing? : A Lot 6 Click Score: 17    End of Session Equipment Utilized During Treatment: Gait belt Activity Tolerance: Patient tolerated treatment well Patient left: in chair;with call bell/phone within reach;with chair alarm set Nurse Communication: Mobility status PT Visit Diagnosis: Other symptoms and signs involving the nervous system (R29.898)    Time: 0940-1008 PT Time Calculation (min) (ACUTE ONLY): 28 min   Charges:   PT Evaluation $PT Eval Moderate Complexity: 1 Mod     PT G Codes:   PT G-Codes **NOT FOR INPATIENT CLASS** Functional Assessment Tool Used: Clinical judgement Functional Limitation: Mobility: Walking and moving around Mobility: Walking and Moving Around Current Status (D4081): At least 20 percent but less than 40 percent  impaired, limited or restricted Mobility:  Walking and Moving Around Goal Status [phone]): At least 1 percent but less than 20 percent impaired, limited or restricted    Alben Deeds, PT DPT  Board Certified Neurologic Specialist [place] 01/14/2017, 10:25 AM

## 2017-01-14 NOTE — Progress Notes (Signed)
Rehab Admissions Coordinator Note:  Patient was screened by Retta Diones for appropriateness for an Inpatient Acute Rehab Consult.  At this time, we are recommending Inpatient Rehab consult.  Jodell Cipro M 01/14/2017, 11:12 AM  I can be reached at [phone].

## 2017-01-14 NOTE — Progress Notes (Signed)
STROKE TEAM PROGRESS NOTE   SUBJECTIVE (INTERVAL HISTORY) Patient is found sitting in bedside chair.   No family at bedside. Overall he feels his condition is rapidly improving.  Voices no new complaints. No new events reported overnight.  Patient explains his admission symptoms of expressive aphasia and Right sided weakness/numbness. He states these symptoms have mostly resolved on exam today.   Long discussion with patient regarding restarting Coumadin vs starting Eliquis. Patient states that his INR is normally therapeutic and that he is very compliant with his Coumadin. Medical record check demonstrates that his last 3 INR's have been in the therapeutic range. At this time the patient prefers to stay on Coumadin.   OBJECTIVE  Recent Labs Lab 01/14/17 0645 01/14/17 1216  GLUCAP 127* 141*    Recent Labs Lab 01/13/17 1522  NA 138  K 4.1  CL 103  CO2 25  GLUCOSE 98  BUN 15  CREATININE 1.11  CALCIUM 9.2    Recent Labs Lab 01/13/17 1522  AST 32  ALT 31  ALKPHOS 60  BILITOT 0.8  PROT 7.1  ALBUMIN 4.0    Recent Labs Lab 01/13/17 1522  WBC 5.8  NEUTROABS 2.9  HGB 13.8  HCT 41.6  MCV 91.0  PLT 153   No results for input(s): CKTOTAL, CKMB, CKMBINDEX, TROPONINI in the last 168 hours.  Recent Labs  01/13/17 1522  LABPROT 20.3*  INR 1.75   No results for input(s): COLORURINE, LABSPEC, PHURINE, GLUCOSEU, HGBUR, BILIRUBINUR, KETONESUR, PROTEINUR, UROBILINOGEN, NITRITE, LEUKOCYTESUR in the last 72 hours.  Invalid input(s): APPERANCEUR     Component Value Date/Time   CHOL 134 01/14/2017 0721   CHOL 138 12/05/2016 1100   TRIG 136 01/14/2017 0721   HDL 45 01/14/2017 0721   HDL 46 12/05/2016 1100   CHOLHDL 3.0 01/14/2017 0721   VLDL 27 01/14/2017 0721   LDLCALC 62 01/14/2017 0721   LDLCALC 67 12/05/2016 1100   Lab Results  Component Value Date   HGBA1C 6.2 (H) 01/14/2017      Component Value Date/Time   LABOPIA NONE DETECTED 12/12/2015 1524    COCAINSCRNUR NONE DETECTED 12/12/2015 1524   LABBENZ NONE DETECTED 12/12/2015 1524   AMPHETMU NONE DETECTED 12/12/2015 1524   THCU NONE DETECTED 12/12/2015 1524   LABBARB NONE DETECTED 12/12/2015 1524    No results for input(s): ETH in the last 168 hours.  IMAGING: I have personally reviewed the radiological images below and agree with the radiology interpretations.  Ct Head Wo Contrast Result Date: 01/13/2017 IMPRESSION: Advanced chronic small vessel disease appears stable since 2016. No acute intracranial abnormality.   Mr Brain Wo Contrast & Mr Virgel Paling XB Contrast Result Date: 01/14/2017 IMPRESSION: 1. No acute intracranial abnormality. 2. Advanced chronic ischemic microangiopathy. 3. Sequelae of remote right basal ganglia hemorrhagic insult. 4. Normal intracranial MRA.     TEE  2D Echo:                    PENDING  B/L Carotid Doppler:         PENDING   PHYSICAL EXAM Temp:  [97.7 F (36.5 C)-98.3 F (36.8 C)] 97.9 F (36.6 C) (10/31 1029) Pulse Rate:  [56-75] 62 (10/31 1029) Resp:  [13-20] 18 (10/31 1029) BP: (126-171)/(66-94) 126/72 (10/31 1029) SpO2:  [93 %-99 %] 95 % (10/31 1029) Weight:  [117.9 kg (260 lb)-118.4 kg (261 lb)] 117.9 kg (260 lb) (10/30 1455)  General - Well nourished, well developed, in no apparent distress Respiratory -  No labored breathing notes on exam Cardiovascular - Regular rate and rhythm  Mental Status: Patient is awake, alert, oriented to person, place, month, year, and situation. Patient is able to give a clear and coherent history. He has a minimal expressive aphasia on exam today Cranial Nerves: II: Visual Fields are full. Pupils are equal, round, and reactive to light.   III,IV, VI: EOMI without ptosis or diploplia.  VII: Facial movement intact bilaterally VIII: hearing is intact to voice X: Uvula elevates symmetrically XI: Shoulder shrug is symmetric. XII: tongue is midline without atrophy or fasciculations.  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  He can give a 5/5 strength, but impaired control.  No right arm pronator drift on exam today. Sensory: Sensation is decreased throughout the left hemibody Cerebellar: He is able to perform with his right hand well.  ASSESSMENT AND PLAN: [name] is a 71 y.o. male with PMH of prior CVA, HTN, HLD, DM2, AFIB on Coumadin, CHF admitted for  Likely TIA: secondary to AFIB with sub-therapeutic INR   Resultant:    Admission symptoms resolved   MRI / MRA Brain/Head:                      No acute intracranial abnormality. Sequelae of remote right basal ganglia hemorrhagic insult.    CT Head: No acute intracranial abnormality   TEE  2D Echo:                    PENDING  B/L Carotid Doppler:         PENDING    VTE prophylaxis: Heparin   Diet heart healthy/carb modified Room service appropriate? Yes; Fluid consistency: Thin    warfarin daily prior to admission, now on warfarin daily.        Please discharge patient on warfarin daily..   Patient counseled to be compliant with his antithrombotic medications        Therapy recommendations:  NONE       Disposition: Likely HOME        Recommendations:   Resume Coumadin - Pharmacy to dose, may consider Eliquis if patient returns with TIA symptoms with non-therapeutic INR   ASA discontinued  Monitor ECHO and Carotid doppler results  Ongoing aggressive stroke risk factor management        Follow up Appointments:    Close PCP follow up   Follow up with [facility], Dr Leonie Man in 6 weeks  AFIB, chronic:  Resume Coumadin - Pharmacy to dose   May consider Eliquis if patient returns with TIA symptoms with non-therapeutic INR. Will discuss again with patient at F/U Neurology appointment   Diabetes:  HgbA1c  6.2    goal < 7.0  Controlled  Currently on Novolog  CBG monitoring  and SSI  DM education and close PCP follow up  Hypertension: Stable Permissive hypertension (OK if <220/120) for 24-48 hours post stroke and then gradually normalized within 5-7 days.  Long term BP goal normotensive. May slowly restart home B/P medications after 48 hours with close PCP follow up.  Hyperlipidemia:  Home meds:  Lipitor 40 mg  LDL   62  , goal < 70  Now -  increased  Lipitor to 80 mg daily  Continue statin at discharge  Other Stroke Risk Factors:  Advanced age  Obesity, Body mass index is 32.5 kg/m.  Hx stroke  Coronary artery disease s/p  CABG  Obstructive sleep apnea, on CPAP at home  CHF  Other Active Problems:  Hypothyroidism  Hospital day # 1  [name] Stroke Neurology Team 01/14/2017 2:06 PM I have personally examined this patient, reviewed notes, independently viewed imaging studies, participated in medical decision making and plan of care.ROS completed by me personally and pertinent positives fully documented  I have made any additions or clarifications directly to the above note. Agree with note above. Marland Kitchen He presented with transient speech difficulties and right-sided weakness and MRI scan demonstrates no acute infarct. He has history of atrial fibrillation and prior stroke with spastic left hemiparesis and is on long-term warfarin with suboptimal INR on admission. I have discussed alternatives to warfarin to the patient but he prefers to stay on it. Recommend aspirin until INR is therapeutic and then discontinue aspirin after that. Greater than 50% time during this 25 minute visit was spent on counseling and coordination of care about his TIA, atrial fibrillation, risk for recurrent strokes and need for risk factor modification and answering questions. Neurology to sign-off at this time. Please call with any further questions or concerns. Thank you for this consultation. Antony Contras, MD Medical Director [facility] Pager: [phone] 01/14/2017 3:53 PM    To contact Stroke Continuity provider, please refer to [email or web address] After hours, contact General Neurology

## 2017-01-14 NOTE — Discharge Summary (Signed)
Physician Discharge Summary  [name]  OXB:[id number]  DOB: 04-Jun-1945  DOA: 01/13/2017 PCP: Debbrah Alar, NP  Admit date: 01/13/2017 Discharge date: 01/14/2017  Admitted From: Home  Disposition: Home   Recommendations for Outpatient Follow-up:  1. Follow up with PCP in 1-2 weeks 2. Please obtain BMP/CBC in one week 3. Get carotid doppler as outpatient  4. Patient on ASA and Warfarin, can d/c ASA once INR is therapeutic  5. Follow up with Neurology in 6 weeks  6. Follow up at the INR clinic   Home Health: PT/OT/SLP Equipment/Devices: None   Discharge Condition: Good  CODE STATUS: Full Code  Diet recommendation: Heart Healthy / Carb Modified   Brief/Interim Summary: For full details see H&P/progress note but in brief, patient is a 71 year old male with medical history significant of stroke with  left side deficit who presented to the emergency department, complaining of right arm numbness and really right facial droop.  In the ED CT of the head was negative for acute abnormality, neurology was consulted and was advised to complete stroke workup.  MRI of the brain was done which did not show new strokes.  Symptoms eventually resolved.  Patient also was evaluated for abdominal pain with right upper quadrant ultrasound which showed increase in gas. Abdominal pain has resolved.   Subjective: Patient seen and examined, patient report feeling well significantly improved, and initial symptoms had essentially resolved.   Discharge Diagnoses/Hospital Course:  TIA in setting of prior CVA  Felt to due to Afib in view of subtherapeutic INR, patient normally on therapeutic ranges and report compliance with medication. Offered patient Eliquis but patient prefers to stay on warfarin.  Neurology was consulted, and recommended to keep ASA and Warfarin until IRN become therapeutic.  Patient advised to follow up at the coumadin clinic for INR check  MRI negative for acute stroke   ECHO 55-60%, G1DD Increase Lipitor to 80 mg   Chronic Afib HR well controlled  See above for anticoagulation  Follow up with PCP for further discussion     DM type 2 well controlled  A1C 6.2  Continue home medications with no changes  PCP follow up   HTN  BP stable during hospital stay  Continue home medications with no changes   HLD  Lipitor increased to 80 mg   All other chronic medical condition were stable during the hospitalization.  Patient was seen by physical therapy, recommending CIR but patient prefers to go home  On the day of the discharge the patient's vitals were stable, and no other acute medical condition were reported by patient. Patient was felt safe to be discharge to home with HHPT   Discharge Instructions  You were cared for by a hospitalist during your hospital stay. If you have any questions about your discharge medications or the care you received while you were in the hospital after you are discharged, you can call the unit and asked to speak with the hospitalist on call if the hospitalist that took care of you is not available. Once you are discharged, your primary care physician will handle any further medical issues. Please note that NO REFILLS for any discharge medications will be authorized once you are discharged, as it is imperative that you return to your primary care physician (or establish a relationship with a primary care physician if you do not have one) for your aftercare needs so that they can reassess your need for medications and monitor your lab values.  Discharge  Instructions    Ambulatory referral to Neurology    Complete by:  As directed    An appointment is requested in approximately: 6 weeks Follow up with stroke clinic (Dr Leonie Man preferred, if not available, then consider Cecille Rubin, Dr Caesar Chestnut, [facility] or Jaynee Eagles whoever is available) at [facility] in about 6-8 weeks. Thanks.   Call MD for:  difficulty breathing, headache or visual  disturbances    Complete by:  As directed    Call MD for:  extreme fatigue    Complete by:  As directed    Call MD for:  hives    Complete by:  As directed    Call MD for:  persistant dizziness or light-headedness    Complete by:  As directed    Call MD for:  persistant nausea and vomiting    Complete by:  As directed    Call MD for:  redness, tenderness, or signs of infection (pain, swelling, redness, odor or green/yellow discharge around incision site)    Complete by:  As directed    Call MD for:  severe uncontrolled pain    Complete by:  As directed    Call MD for:  temperature >100.4    Complete by:  As directed    Diet - low sodium heart healthy    Complete by:  As directed    Increase activity slowly    Complete by:  As directed      Allergies as of 01/14/2017   No Known Allergies     Medication List    TAKE these medications   albuterol 108 (90 Base) MCG/ACT inhaler Commonly known as:  PROVENTIL HFA;VENTOLIN HFA Inhale 1-2 puffs into the lungs every 6 (six) hours as needed for wheezing or shortness of breath.   allopurinol 100 MG tablet Commonly known as:  ZYLOPRIM TAKE 2 TABLETS EVERY DAY What changed:  See the new instructions.   atorvastatin 40 MG tablet Commonly known as:  LIPITOR TAKE 2 TABLETS (80 MG TOTAL) BY MOUTH DAILY. What changed:  when to take this   benazepril 10 MG tablet Commonly known as:  LOTENSIN Take 1 tablet (10 mg total) by mouth daily. What changed:  when to take this  additional instructions   benazepril 5 MG tablet Commonly known as:  LOTENSIN Take 1 tablet (5 mg total) by mouth daily. Take 1 tablet daily with the 10 mg tablet of Benazepril = 15 mg daily What changed:  when to take this  additional instructions   cyanocobalamin 1000 MCG tablet Take 1 tablet (1,000 mcg total) by mouth daily.   fluticasone 50 MCG/ACT nasal spray Commonly known as:  FLONASE Place 2 sprays into both nostrils daily. What changed:  when to  take this  reasons to take this   furosemide 40 MG tablet Commonly known as:  LASIX Take 1 tablet (40 mg total) by mouth every other day.   isosorbide mononitrate 30 MG 24 hr tablet Commonly known as:  IMDUR Take 0.5 tablets (15 mg total) by mouth daily. What changed:  when to take this   lactulose 10 GM/15ML solution Commonly known as:  CHRONULAC Take 15 mLs (10 g total) by mouth daily as needed for mild constipation.   levothyroxine 112 MCG tablet Commonly known as:  SYNTHROID, LEVOTHROID TAKE 1 TABLET BY MOUTH DAILY BEFORE BREAKFAST. What changed:  See the new instructions.   metFORMIN 500 MG tablet Commonly known as:  GLUCOPHAGE Take 1 tablet (500 mg total) by  mouth daily with breakfast. What changed:  when to take this   MULTIVITAMIN GUMMIES MENS PO Take 1 tablet by mouth daily with breakfast.   nitroGLYCERIN 0.4 MG SL tablet Commonly known as:  NITROSTAT Place 1 tablet (0.4 mg total) under the tongue every 5 (five) minutes as needed for chest pain. Up to 3 doses   OXYGEN Inhale 2 L into the lungs at bedtime.   pantoprazole 40 MG tablet Commonly known as:  PROTONIX Take 1 tablet (40 mg total) by mouth daily. What changed:  when to take this   traMADol 50 MG tablet Commonly known as:  ULTRAM Take 1 tablet (50 mg total) by mouth 2 (two) times daily. What changed:  additional instructions   traMADol 200 MG 24 hr tablet Commonly known as:  ULTRAM-ER Take 1 tablet (200 mg total) by mouth daily. What changed:  when to take this   warfarin 2.5 MG tablet Commonly known as:  COUMADIN TAKE AS DIRECTED BY ANTICOAGULATION CLINIC What changed:  See the new instructions.      Follow-up Information    Garvin Fila, MD. Schedule an appointment as soon as possible for a visit in 6 week(s).   Specialties:  Neurology, Radiology Contact information: [place] [address] [phone]        Debbrah Alar, NP. Schedule an  appointment as soon as possible for a visit in 1 week(s).   Specialty:  Internal Medicine Why:  Hospital follow up  Contact information: [place] [address] [phone]          No Known Allergies  Consultations:  Neurology   Procedures/Studies: Dg Chest 2 View  Result Date: 01/13/2017 CLINICAL DATA:  Heartburn.  Right arm weakness. EXAM: CHEST  2 VIEW COMPARISON:  11/04/2016 CT.  10/16/2016 chest radiograph. FINDINGS: Prior median sternotomy. Artifact degraded lateral view posteriorly. Patient rotated to the right on the frontal radiograph. Prior median sternotomy. Moderate right hemidiaphragm elevation. Midline trachea. Mild cardiomegaly with a tortuous thoracic aorta. No pleural effusion or pneumothorax. No congestive failure. Clear lungs. IMPRESSION: No acute cardiopulmonary disease. Right hemidiaphragm elevation. Electronically Signed   By: Abigail Miyamoto M.D.   On: 01/13/2017 21:29   Ct Head Wo Contrast  Result Date: 01/13/2017 CLINICAL DATA:  71 year old male with right arm numbness beginning at 1230 hours today. Symptoms now resolved. EXAM: CT HEAD WITHOUT CONTRAST TECHNIQUE: Contiguous axial images were obtained from the base of the skull through the vertex without intravenous contrast. COMPARISON:  Head CT 02/20/2016.  Brain MRI 11/25/2014 and earlier. FINDINGS: Brain: Patchy and confluent bilateral cerebral white matter T2 and FLAIR hyperintensity including what on the prior MRI appeared to be a chronic lacunar type infarct of the right deep white matter capsules and lentiform nucleus. Stable cerebral volume since 2016. No midline shift, ventriculomegaly, mass effect, evidence of mass lesion, intracranial hemorrhage or evidence of cortically based acute infarction. Stable gray-white matter differentiation throughout the brain. Vascular: Mild Calcified atherosclerosis at the skull base. No suspicious intracranial vascular hyperdensity. Skull: No  acute osseous abnormality identified. Sinuses/Orbits: Mild bilateral paranasal sinus mucosal thickening, similar to the 2016 MRI. Tympanic cavities and mastoids are clear. Other: No acute orbit or scalp soft tissue findings. IMPRESSION: Advanced chronic small vessel disease appears stable since 2016. No acute intracranial abnormality. Electronically Signed   By: Genevie Ann M.D.   On: 01/13/2017 15:39   Mr Brain Wo Contrast  Result Date: 01/14/2017 CLINICAL  DATA:  Right-sided weakness and difficulty walking. Atrial fibrillation. EXAM: MRI HEAD WITHOUT CONTRAST MRA HEAD WITHOUT CONTRAST TECHNIQUE: Multiplanar, multiecho pulse sequences of the brain and surrounding structures were obtained without intravenous contrast. Angiographic images of the head were obtained using MRA technique without contrast. COMPARISON:  Head CT 01/13/2017 Brain MRI 11/25/2014 FINDINGS: MRI HEAD FINDINGS Brain: The midline structures are normal. No focal diffusion restriction to indicate acute infarct. No intraparenchymal hemorrhage. There is diffuse confluent hyperintense T2-weighted signal of the periventricular and deep white matter consistent with chronic ischemic microangiopathy. There is encephalomalacia of the right insula and basal ganglia with hemosiderin deposition at the site of remote hemorrhagic infarct. No mass lesion. No hydrocephalus, age advanced atrophy or lobar predominant volume loss. No dural abnormality or extra-axial collection. Skull and upper cervical spine: The visualized skull base, calvarium, upper cervical spine and extracranial soft tissues are normal. Sinuses/Orbits: No fluid levels or advanced mucosal thickening. No mastoid effusion. Normal orbits. MRA HEAD FINDINGS Intracranial internal carotid arteries: Normal. Anterior cerebral arteries: The appearance is degraded by artifact. Both anterior cerebral arteries are patent. Middle cerebral arteries: Normal. Posterior communicating arteries: Present on the  left. Posterior cerebral arteries: Normal. Basilar artery: Normal. Vertebral arteries: Left dominant. Normal. Superior cerebellar arteries: Normal. IMPRESSION: 1. No acute intracranial abnormality. 2. Advanced chronic ischemic microangiopathy. 3. Sequelae of remote right basal ganglia hemorrhagic insult. 4. Normal intracranial MRA. Electronically Signed   By: Ulyses Jarred M.D.   On: 01/14/2017 02:07   Mr Jodene Nam Head Wo Contrast  Result Date: 01/14/2017 CLINICAL DATA:  Right-sided weakness and difficulty walking. Atrial fibrillation. EXAM: MRI HEAD WITHOUT CONTRAST MRA HEAD WITHOUT CONTRAST TECHNIQUE: Multiplanar, multiecho pulse sequences of the brain and surrounding structures were obtained without intravenous contrast. Angiographic images of the head were obtained using MRA technique without contrast. COMPARISON:  Head CT 01/13/2017 Brain MRI 11/25/2014 FINDINGS: MRI HEAD FINDINGS Brain: The midline structures are normal. No focal diffusion restriction to indicate acute infarct. No intraparenchymal hemorrhage. There is diffuse confluent hyperintense T2-weighted signal of the periventricular and deep white matter consistent with chronic ischemic microangiopathy. There is encephalomalacia of the right insula and basal ganglia with hemosiderin deposition at the site of remote hemorrhagic infarct. No mass lesion. No hydrocephalus, age advanced atrophy or lobar predominant volume loss. No dural abnormality or extra-axial collection. Skull and upper cervical spine: The visualized skull base, calvarium, upper cervical spine and extracranial soft tissues are normal. Sinuses/Orbits: No fluid levels or advanced mucosal thickening. No mastoid effusion. Normal orbits. MRA HEAD FINDINGS Intracranial internal carotid arteries: Normal. Anterior cerebral arteries: The appearance is degraded by artifact. Both anterior cerebral arteries are patent. Middle cerebral arteries: Normal. Posterior communicating arteries: Present on  the left. Posterior cerebral arteries: Normal. Basilar artery: Normal. Vertebral arteries: Left dominant. Normal. Superior cerebellar arteries: Normal. IMPRESSION: 1. No acute intracranial abnormality. 2. Advanced chronic ischemic microangiopathy. 3. Sequelae of remote right basal ganglia hemorrhagic insult. 4. Normal intracranial MRA. Electronically Signed   By: Ulyses Jarred M.D.   On: 01/14/2017 02:07   US Abdomen Limited Ruq  Result Date: 01/14/2017 CLINICAL DATA:  71 year old male with right upper quadrant pain. History of gastric bypass. EXAM: ULTRASOUND ABDOMEN LIMITED RIGHT UPPER QUADRANT COMPARISON:  CT of the abdomen pelvis dated 10/29/2005 FINDINGS: Evaluation is limited due to overlying bowel gas. Gallbladder: Not visualized and obscured by bowel gas. Common bile duct: Diameter: Poorly visualized measuring 3 mm. Liver: The liver is poorly visualized due to overlying bowel gas. Assessment of the  main portal vein is very limited due to patient's body habitus and bowel gas. IMPRESSION: Very limited exam due to overlying bowel gas. Nonvisualization of the gallbladder. Electronically Signed   By: Anner Crete M.D.   On: 01/14/2017 00:17   ECHO  ------------------------------------------------------------------- Study Conclusions  - Left ventricle: The cavity size was normal. Wall thickness was   increased in a pattern of mild LVH. Systolic function was normal.   The estimated ejection fraction was in the range of 55% to 60%.   No apical mural thrombus. Wall motion was normal; there were no   regional wall motion abnormalities. Doppler parameters are   consistent with abnormal left ventricular relaxation (grade 1   diastolic dysfunction). The E/e&' ratio is <8, suggesting normal   LV filling pressure. - Left atrium: The atrium was mildly dilated. - Atrial septum: Aneurysmal IAS - cannot exclude a PFO. - Tricuspid valve: There was trivial regurgitation. - Pulmonary arteries: PA peak  pressure: 22 mm Hg (S). - Inferior vena cava: The vessel was normal in size. The   respirophasic diameter changes were in the normal range (>= 50%),   consistent with normal central venous pressure.  Impressions:  - Compared to a prior study in 2017, the LVEF is higher at 55-60%.   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 Consider bubble study to further evaluate.  Discharge Exam: Vitals:   01/14/17 1029 01/14/17 1450  BP: 126/72 111/66  Pulse: 62 61  Resp: 18 18  Temp: 97.9 F (36.6 C)   SpO2: 95% 97%   Vitals:   01/14/17 0204 01/14/17 0645 01/14/17 1029 01/14/17 1450  BP: (!) 149/66 (!) 155/71 126/72 111/66  Pulse: (!) 57 (!) 56 62 61  Resp: 18 18 18 18   Temp: 98.3 F (36.8 C) 97.7 F (36.5 C) 97.9 F (36.6 C)   TempSrc: Oral Oral Oral Oral  SpO2:  99% 95% 97%  Weight:      Height:        General: NAD  Cardiovascular: RRR, S1/S2 +, no rubs, no gallops Respiratory: CTA bilaterally, no wheezing, no rhonchi Abdominal: Soft, NT, ND, bowel sounds + Extremities: no edema, spastic left side hemiparesis   The results of significant diagnostics from this hospitalization (including imaging, microbiology, ancillary and laboratory) are listed below for reference.     Microbiology: No results found for this or any previous visit (from the past 240 hour(s)).   Labs: BNP (last 3 results)  Recent Labs  10/16/16 1402 01/13/17 1522  BNP 88.7 093.8*   Basic Metabolic Panel:  Recent Labs Lab 01/13/17 1522  NA 138  K 4.1  CL 103  CO2 25  GLUCOSE 98  BUN 15  CREATININE 1.11  CALCIUM 9.2   Liver Function Tests:  Recent Labs Lab 01/13/17 1522  AST 32  ALT 31  ALKPHOS 60  BILITOT 0.8  PROT 7.1  ALBUMIN 4.0    Recent Labs Lab 01/13/17 2059  LIPASE 28   No results for input(s): AMMONIA in the last 168 hours. CBC:  Recent Labs Lab 01/13/17 1522  WBC 5.8  NEUTROABS 2.9  HGB 13.8  HCT 41.6  MCV 91.0  PLT 153    Cardiac Enzymes: No results for input(s): CKTOTAL, CKMB, CKMBINDEX, TROPONINI in the last 168 hours. BNP: Invalid input(s): POCBNP CBG:  Recent Labs Lab 01/14/17 0645 01/14/17 1216  GLUCAP 127* 141*   D-Dimer No results for input(s): DDIMER in the last  72 hours. Hgb A1c  Recent Labs  01/14/17 0721  HGBA1C 6.2*   Lipid Profile  Recent Labs  01/14/17 0721  CHOL 134  HDL 45  LDLCALC 62  TRIG 136  CHOLHDL 3.0   Thyroid function studies No results for input(s): TSH, T4TOTAL, T3FREE, THYROIDAB in the last 72 hours.  Invalid input(s): FREET3 Anemia work up No results for input(s): VITAMINB12, FOLATE, FERRITIN, TIBC, IRON, RETICCTPCT in the last 72 hours. Urinalysis    Component Value Date/Time   COLORURINE AMBER (A) 12/12/2015 1524   APPEARANCEUR CLEAR 12/12/2015 1524   LABSPEC 1.036 (H) 12/12/2015 1524   PHURINE 5.0 12/12/2015 1524   GLUCOSEU 250 (A) 12/12/2015 1524   HGBUR NEGATIVE 12/12/2015 1524   BILIRUBINUR NEGATIVE 12/12/2015 1524   BILIRUBINUR Pos 08/16/2014 1655   KETONESUR NEGATIVE 12/12/2015 1524   PROTEINUR NEGATIVE 12/12/2015 1524   UROBILINOGEN 1.0 11/25/2014 1806   NITRITE NEGATIVE 12/12/2015 1524   LEUKOCYTESUR NEGATIVE 12/12/2015 1524   Sepsis Labs Invalid input(s): PROCALCITONIN,  WBC,  LACTICIDVEN Microbiology No results found for this or any previous visit (from the past 240 hour(s)).  Time coordinating discharge: 32 minutes  SIGNED:  Chipper Oman, MD  [facility] 01/14/2017, 4:38 PM  Pager please text page via  [email or web address] Password [email or web address]

## 2017-01-14 NOTE — Consult Note (Signed)
   [facility] CM Inpatient Consult   01/14/2017  [name] 1945/10/07 [id number]    Made aware that [name] will enroll in the [place] program.   Spoke with [name] to discuss [facility] program services if transportation or medication needs are identified while on the [place] program. [name] is agreeable and verbal consent received for potential [facility] [place] [facility] program.  Will alert [facility] office.   Marthenia Rolling, MSN-Ed, RN,BSN [facility] Liaison [phone]

## 2017-01-14 NOTE — Evaluation (Signed)
Speech Language Pathology Evaluation Patient Details Name: [name] MRN: [id number] DOB: 05-31-1945 Today's Date: 01/14/2017 Time: 7062-3762 SLP Time Calculation (min) (ACUTE ONLY): 27 min  Problem List:  Patient Active Problem List   Diagnosis Date Noted  . Stroke (cerebrum) ([place]) 01/13/2017  . RUQ abdominal pain 01/13/2017  . PAF (paroxysmal atrial fibrillation) ([place]) 12/06/2016  . Dyspnea on exertion 10/21/2016  . Acute respiratory failure with hypoxia and hypercapnia ([place]) 12/12/2015  . Coronary artery disease due to lipid rich plaque 11/04/2015  . CAD S/P CFX DES 2011 04/25/2015  . Persistent atrial fibrillation ([place]) 04/25/2015  . Vasovagal syncope   . Constipation 04/24/2015  . Chronic diastolic heart failure ([place]) 04/24/2015  . B12 deficiency 08/11/2013  . Sensory disturbance 08/11/2013  . Ataxia 08/10/2013  . Insomnia 05/21/2013  . Hypothyroidism, acquired 02/15/2013  . Fatigue 11/17/2012  . Spastic hemiplegia affecting dominant side ([place]) 07/21/2011  . Long term current use of anticoagulant therapy 06/24/2011  . Hypogonadism male 04/10/2011  . Allergic rhinitis 02/13/2010  . BENIGN POSITIONAL VERTIGO 11/23/2009  . 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 Hypothyroidism 04/02/2009  . Type 2 diabetes mellitus without complication, without long-term current use of insulin ([place]) 10/05/2007  . Obstructive sleep apnea-Failed CPAP 06/17/2007  . Hyperlipidemia 12/12/2006  . GOUT 12/12/2006  . Essential hypertension 12/12/2006  . History of stroke with residual deficit 12/12/2006  . GERD 12/12/2006   Past Medical History:  Past Medical History:  Diagnosis Date  . CAD (coronary artery disease)    s/p CABG, s/p DES to LCX  January 2011  . Chronic pain    left sided-Kristeins  . CVA (cerebral infarction)    left hemiparesis  . Diastolic CHF ([place])   . Dysrhythmia    hx of atrial fibrilation with cardioversion  . GERD (gastroesophageal reflux disease)   . Gout   .  Hyperlipidemia   . Hypertension   . Hypothyroidism   . OSA on CPAP   . Sleep apnea   . Stroke ([place]) 1996  . Tubular adenoma of colon 05/2010   Past Surgical History:  Past Surgical History:  Procedure Laterality Date  . CARDIAC CATHETERIZATION N/A 10/25/2015   Procedure: Left Heart Cath and Cors/Grafts Angiography;  Surgeon: Troy Sine, MD;  Location: [facility];  Service: Cardiovascular;  Laterality: N/A;  . CARDIOVERSION  06/20/2011   Procedure: CARDIOVERSION;  Surgeon: Larey Dresser, MD;  Location: [facility] ENDOSCOPY;  Service: Cardiovascular;  Laterality: N/A;  . CARDIOVERSION N/A 04/26/2015   Procedure: CARDIOVERSION;  Surgeon: Dorothy Spark, MD;  Location: [facility] ENDOSCOPY;  Service: Cardiovascular;  Laterality: N/A;  . CARDIOVERSION N/A 05/10/2015   Procedure: CARDIOVERSION;  Surgeon: Larey Dresser, MD;  Location: [facility];  Service: Cardiovascular;  Laterality: N/A;  . CATARACT EXTRACTION  05/2010   left eye  . CATARACT EXTRACTION  04/2010   right eye  . CORONARY ARTERY BYPASS GRAFT     stent  . ESOPHAGOGASTRODUODENOSCOPY  03-25-2005  . NASAL SEPTUM SURGERY    . PERCUTANEOUS PLACEMENT INTRAVASCULAR STENT CERVICAL CAROTID ARTERY     03-2009; using a drug-eluting platform of the circumflex cornoray artery with a 3.0 x 18 [employer] Promus drug-eluting platform post dilated to 3.75 with a noncompliant balloon.  . TEE WITHOUT CARDIOVERSION  06/20/2011   Procedure: TRANSESOPHAGEAL ECHOCARDIOGRAM (TEE);  Surgeon: Larey Dresser, MD;  Location: [facility] ENDOSCOPY;  Service: Cardiovascular;  Laterality: N/A;   HPI:  Pt is a 71 y.o.malewith medical  history significant of stroke with left sided numbness and left arm weakness, hypertension, hyperlipidemia, GERD, hypothyroidism, gout, OSA, CAD, s/p of CABG, dCHF, PAF on Coumadin, who presents with right arm numbness in the right facial droop. 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    Assessment / Plan / Recommendation Clinical Impression  Pt's speech characterized by slow, effortful speech, occasional paraphasias and self-reported word-finding difficulty resulting in sound/part-word/word repetitions; receptive language WNL. Pt demonstrates social language deficits marked by tangential thoughts, inappropriate responses and lack of turn-taking abilities when responding to questions. Pt would benefit from SLP intervention targeting deficits noted above; recommended this to pt. Will continue to follow acutely with recommendation for CIR.     SLP Assessment  SLP Recommendation/Assessment: Patient needs continued Speech Lanaguage Pathology Services SLP Visit Diagnosis: Cognitive communication deficit (R41.841)    Follow Up Recommendations  Inpatient Rehab    Frequency and Duration min 2x/week  2 weeks      SLP Evaluation Cognition  Overall Cognitive Status: Within Functional Limits for tasks assessed Arousal/Alertness: Awake/alert Orientation Level: Oriented to person;Oriented to situation;Oriented to place Attention: Sustained Sustained Attention: Appears intact Memory: Appears intact Awareness: Appears intact Safety/Judgment: Appears intact       Comprehension  Auditory Comprehension Overall Auditory Comprehension: Appears within functional limits for tasks assessed Visual Recognition/Discrimination Discrimination: Not tested Reading Comprehension Reading Status: Not tested    Expression Expression Primary Mode of Expression: Verbal Verbal Expression Overall Verbal Expression: Appears within functional limits for tasks assessed Initiation: No impairment Level of Generative/Spontaneous Verbalization: Conversation Naming: No impairment Pragmatics: Impairment Impairments: Turn Taking;Topic maintenance;Topic appropriateness Interfering Components: Attention Non-Verbal Means of  Communication: Not applicable Written Expression Dominant Hand: Right Written Expression: Not tested   Oral / Motor  Oral Motor/Sensory Function Overall Oral Motor/Sensory Function: Within functional limits Motor Speech Overall Motor Speech: Appears within functional limits for tasks assessed   GO                    Aaron Edelman, Student SLP 01/14/2017, 9:52 AM

## 2017-01-14 NOTE — Care Management Obs Status (Signed)
[place] NOTIFICATION   Patient Details  Name: [name] MRN: [id number] Date of Birth: 05/11/1945   Medicare Observation Status Notification Given:  Yes    Pollie Friar, RN 01/14/2017, 5:26 PM

## 2017-01-14 NOTE — Progress Notes (Signed)
Discharge orders received.  Discharge instructions and follow-up appointments reviewed with the patient and his daughter.  VSS upon discharge.  IV removed and education complete.  Transported out via wheelchair.   Delainie Chavana M, RN 

## 2017-01-14 NOTE — Progress Notes (Signed)
ANTICOAGULATION CONSULT NOTE - Initial Consult  Pharmacy Consult for Warfarin Indication: atrial fibrillation / CVA  No Known Allergies  Patient Measurements: Height: 6\' 3"  (190.5 cm) Weight: 260 lb (117.9 kg) IBW/kg (Calculated) : 84.5   Vital Signs: Temp: 97.9 F (36.6 C) (10/31 1029) Temp Source: Oral (10/31 1029) BP: 126/72 (10/31 1029) Pulse Rate: 62 (10/31 1029)  Labs:  Recent Labs  01/13/17 1522  HGB 13.8  HCT 41.6  PLT 153  APTT 33  LABPROT 20.3*  INR 1.75  CREATININE 1.11    Estimated Creatinine Clearance: 82.1 mL/min (by C-G formula based on SCr of 1.11 mg/dL).   Medical History: Past Medical History:  Diagnosis Date  . CAD (coronary artery disease)    s/p CABG, s/p DES to LCX  January 2011  . Chronic pain    left sided-Kristeins  . CVA (cerebral infarction)    left hemiparesis  . Diastolic CHF ([place])   . Dysrhythmia    hx of atrial fibrilation with cardioversion  . GERD (gastroesophageal reflux disease)   . Gout   . Hyperlipidemia   . Hypertension   . Hypothyroidism   . OSA on CPAP   . Sleep apnea   . Stroke ([place]) 1996  . Tubular adenoma of colon 05/2010    Assessment: 71 year old male on warfarin prior to admission for Afib, admitted with new aphasia INR low on admission at 1.75  Dose PTA = 1.25 mg TTSS, 2.5 mg MWF  Goal of Therapy:  INR 2-3 Monitor platelets by anticoagulation protocol: Yes   Plan:  Warfarin 4 mg po x 1 tonight Daily INR  Thank you Anette Guarneri, PharmD [phone]  01/14/2017,12:58 PM

## 2017-01-14 NOTE — Telephone Encounter (Signed)
Pt is in the hospital.  Will plan to see him after discharge.

## 2017-01-14 NOTE — Care Management Note (Signed)
Case Management Note  Patient Details  Name: [name] MRN: [id number] Date of Birth: [birth year]  Subjective/Objective:    Pt in to r/o CVA. He is from home with his wife and daughter.                 Action/Plan: Pt discharging home with [facility] services. PT/OT recommending CIR, however patient doesn't have qualifying diagnosis.  CM spoke to patients daughter, Latanya Presser, and inquired about [facility] First program to provide extra assistance at home. She was very interested. CM notified [place] with [facility].  Per Tommi Rumps patient qualifies and he will start having services in the am.  Family to provide transportation home today.   Expected Discharge Date:  01/14/17               Expected Discharge Plan:  [place]  In-House Referral:     Discharge planning Services  CM Consult  Post Acute Care Choice:  Home Health Choice offered to:  Adult Children  DME Arranged:    DME Agency:     HH Arranged:  PT, OT, Speech Therapy, Nurse's Aide HH Agency:  [place] (Home first)  Status of Service:  Completed, signed off  If discussed at [place] of Stay Meetings, dates discussed:    Additional Comments:  Pollie Friar, RN 01/14/2017, 4:58 PM

## 2017-01-14 NOTE — ED Notes (Signed)
Patient transported to MRI 

## 2017-01-14 NOTE — Care Management Obs Status (Signed)
[place] NOTIFICATION   Patient Details  Name: [name] MRN: [id number] Date of Birth: 1945/10/11   Medicare Observation Status Notification Given:  Yes    Pollie Friar, RN 01/14/2017, 5:26 PM

## 2017-01-15 ENCOUNTER — Telehealth: Payer: Self-pay | Admitting: Family

## 2017-01-15 NOTE — Telephone Encounter (Signed)
Karolee Stamps at [facility] states she is not following pt for home care he is doing fine and pt is not interested in persuing any home care at this time. Pt as at [facility] hosp less than 24 hrs. They gave dischg instructions to take a whole aspirin every day but pt takes coumadin every day so she would like [place] to contact pt to instruct him whether to take the aspirin in addition to the coumadin.  Karolee Stamps [phone] for questions/ concerns.

## 2017-01-16 ENCOUNTER — Telehealth: Payer: Self-pay | Admitting: *Deleted

## 2017-01-16 ENCOUNTER — Telehealth: Payer: Self-pay | Admitting: Behavioral Health

## 2017-01-16 NOTE — Telephone Encounter (Signed)
Left message for pt to return my call.

## 2017-01-16 NOTE — Telephone Encounter (Signed)
Transition Care Management Follow-up Telephone Call  PCP: Debbrah Alar, NP  Admit date: 01/13/2017 Discharge date: 01/14/2017  Admitted From: Home  Disposition: Home   Recommendations for Outpatient Follow-up:  1. Follow up with PCP in 1-2 weeks 2. Please obtain BMP/CBC in one week 3. Get carotid doppler as outpatient  4. Patient on ASA and Warfarin, can d/c ASA once INR is therapeutic  5. Follow up with Neurology in 6 weeks  6. Follow up at the INR clinic   Home Health: PT/OT/SLP Equipment/Devices: None   Discharge Condition: Good    How have you been since you were released from the hospital? Patient stated, "I'm feeling a little out of sorts".   Do you understand why you were in the hospital? yes, patient voiced mini stroke.   Do you understand the discharge instructions? yes   Where were you discharged to? Home with wife.   Items Reviewed:  Medications reviewed: yes  Allergies reviewed: yes, NKA  Dietary changes reviewed: yes, heart healthy, carb-modified  Referrals reviewed: yes, Follow up with PCP in 1-2 weeks; Follow up with Neurology in 6 weeks; Follow up at the INR clinic    Functional Questionnaire:   Activities of Daily Living (ADLs):   He states they are independent in the following: ambulation, bathing and hygiene, feeding, continence, grooming, toileting and dressing States they require assistance with the following: None   Any transportation issues/concerns?: no   Any patient concerns? no   Confirmed importance and date/time of follow-up visits scheduled yes, 01/19/17 at 7:20 AM.  Provider Appointment booked with Debbrah Alar, NP.  Confirmed with patient if condition begins to worsen call PCP or go to the ER.  Patient was given the office number and encouraged to call back with question or concerns.  : yes

## 2017-01-16 NOTE — Telephone Encounter (Signed)
Reviewed Neurology note, they recommended d/c aspirin. So he can d/c aspirin but needs to continue coumadin.   He needs follow up OV with me and INR check next Wednesday please.

## 2017-01-16 NOTE — Telephone Encounter (Signed)
Received Physician Orders from [facility]; forwarded to provider/SLS 11/02

## 2017-01-19 ENCOUNTER — Telehealth: Payer: Self-pay | Admitting: Family

## 2017-01-19 ENCOUNTER — Ambulatory Visit (INDEPENDENT_AMBULATORY_CARE_PROVIDER_SITE_OTHER): Payer: Medicare Other | Admitting: Family

## 2017-01-19 ENCOUNTER — Encounter: Payer: Self-pay | Admitting: Family

## 2017-01-19 ENCOUNTER — Ambulatory Visit (INDEPENDENT_AMBULATORY_CARE_PROVIDER_SITE_OTHER): Payer: Medicare Other | Admitting: Cardiovascular Disease

## 2017-01-19 VITALS — BP 131/86 | HR 77 | Temp 98.5°F | Resp 16 | Ht 75.0 in | Wt 263.8 lb

## 2017-01-19 DIAGNOSIS — G459 Transient cerebral ischemic attack, unspecified: Secondary | ICD-10-CM | POA: Diagnosis not present

## 2017-01-19 DIAGNOSIS — Z7901 Long term (current) use of anticoagulants: Secondary | ICD-10-CM | POA: Diagnosis not present

## 2017-01-19 DIAGNOSIS — E119 Type 2 diabetes mellitus without complications: Secondary | ICD-10-CM

## 2017-01-19 DIAGNOSIS — I4891 Unspecified atrial fibrillation: Secondary | ICD-10-CM

## 2017-01-19 LAB — POCT INR: INR: 1.4

## 2017-01-19 MED ORDER — LEVOTHYROXINE SODIUM 112 MCG PO TABS: ORAL_TABLET | ORAL | 0 refills | Status: DC

## 2017-01-19 MED ORDER — WARFARIN SODIUM 2.5 MG PO TABS: 2.5000 mg | ORAL_TABLET | Freq: Every day | ORAL | 1 refills | Status: DC

## 2017-01-19 MED ORDER — ASPIRIN EC 325 MG PO TBEC: 325.0000 mg | DELAYED_RELEASE_TABLET | Freq: Every day | ORAL | 0 refills | Status: DC

## 2017-01-19 MED ORDER — PANTOPRAZOLE SODIUM 40 MG PO TBEC: 40.0000 mg | DELAYED_RELEASE_TABLET | Freq: Every morning | ORAL | 1 refills | Status: DC

## 2017-01-19 MED ORDER — METFORMIN HCL 500 MG PO TABS: 500.0000 mg | ORAL_TABLET | Freq: Every day | ORAL | 1 refills | Status: DC

## 2017-01-19 NOTE — Patient Instructions (Signed)
Start Aspirin 325mg  once daily until your coumadin level is therapeutic (INR is equal to or greater than 2.0) Take a full tablet of coumadin today and tomorrow and keep your appointment with the coumadin clinic on Wednesday.   You will be contacted about your carotid doppler.

## 2017-01-19 NOTE — Telephone Encounter (Signed)
Hello [name], [name] has a follow up INR check on 11/7 with your office.  1 to please forward his INR to me when you see him?  We plan to discontinue his aspirin when his INR is therapeutic at 2.0.  He was subtherapeutic today.

## 2017-01-19 NOTE — Telephone Encounter (Signed)
Telephoned pt and discussed his low INR that was drawn at his PCP office. Dose adjusted as his PCP wants next INR sent to her so she can discontinue ASA when above 2.0. Follow up appt on 01/21/17.

## 2017-01-19 NOTE — Progress Notes (Signed)
Subjective:    Patient ID: [name], male    DOB: 1945-04-07, 71 y.o.   MRN: [id number]  HPI  [name] is a 71 yr old male who presents today for hospital follow up.  Hospital discharge summary is reviewed.  He was admitted on January 13, 2017 overnight and discharged home on January 14, 2017. He presented to the emergency C department complaining of right arm numbness  And right facial droop.  MRI of the brain was performed which did not show acute CVA.  Symptoms eventually resolved. INR was noted to be subtherapeutic.   At time of admission.  He was discharged home on Coumadin.  Neurology was consulted during his admission and they discussed other anticoagulants such as Eliquis but patient declined.  It appears that it was recommended that he continue aspirin at time of discharge however this is not on his discharge medication list and have been discontinued in the hospital.  DM2-  Lab Results  Component Value Date   HGBA1C 6.2 (H) 01/14/2017   HGBA1C 6.4 05/21/2016   HGBA1C 5.8 (H) 10/22/2015   Lab Results  Component Value Date   MICROALBUR <0.7 09/26/2014   [place] 62 01/14/2017   CREATININE 1.11 01/13/2017     He reports that he feels a bit sluggish. Denies current right arm numbness or weakness.     Review of Systems    see HPI  Past Medical History:  Diagnosis Date  . CAD (coronary artery disease)    s/p CABG, s/p DES to LCX  January 2011  . Chronic pain    left sided-Kristeins  . CVA (cerebral infarction)    left hemiparesis  . Diastolic CHF ([place])   . Dysrhythmia    hx of atrial fibrilation with cardioversion  . GERD (gastroesophageal reflux disease)   . Gout   . Hyperlipidemia   . Hypertension   . Hypothyroidism   . OSA on CPAP   . Sleep apnea   . Stroke ([place]) 1996  . Tubular adenoma of colon 05/2010     Social History   Socioeconomic History  . Marital status: Married    Spouse name: [name]  . Number of children: Not on file  . Years  of education: 20  . Highest education level: Not on file  Social Needs  . Financial resource strain: Not on file  . Food insecurity - worry: Not on file  . Food insecurity - inability: Not on file  . Transportation needs - medical: Not on file  . Transportation needs - non-medical: Not on file  Occupational History  . 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 Smoking status: Never Smoker  . Smokeless tobacco: Never Used  Substance and Sexual Activity  . Alcohol use: No    Alcohol/week: 0.0 oz    Comment: 1 beer a month  . Drug use: No  . Sexual activity: No  Other Topics Concern  . Not on file  Social History Narrative   Pt lives with wife. Does have stairs, but patient doesn't use them. Pt has completed technical school    Past Surgical History:  Procedure Laterality Date  . CATARACT EXTRACTION  05/2010   left eye  . CATARACT EXTRACTION  04/2010   right eye  . CORONARY ARTERY BYPASS GRAFT     stent  . ESOPHAGOGASTRODUODENOSCOPY  03-25-2005  . NASAL SEPTUM SURGERY    . PERCUTANEOUS PLACEMENT INTRAVASCULAR  STENT CERVICAL CAROTID ARTERY     03-2009; using a drug-eluting platform of the circumflex cornoray artery with a 3.0 x 18 [employer] Promus drug-eluting platform post dilated to 3.75 with a noncompliant balloon.    Family History  Problem Relation Age of Onset  . Lung cancer Father        deceased  . Stroke Mother        deceased-[place]    No Known Allergies  Current Outpatient Medications on File Prior to Visit  Medication Sig Dispense Refill  . albuterol (PROVENTIL HFA;VENTOLIN HFA) 108 (90 Base) MCG/ACT inhaler Inhale 1-2 puffs into the lungs every 6 (six) hours as needed for wheezing or shortness of breath.     . allopurinol (ZYLOPRIM) 100 MG tablet TAKE 2 TABLETS EVERY DAY (Patient taking differently: Take 200 mg by mouth in the morning) 180 tablet 1  . atorvastatin (LIPITOR) 40 MG tablet TAKE 2 TABLETS (80  MG TOTAL) BY MOUTH DAILY. (Patient taking differently: Take 80 mg by mouth at bedtime. ) 180 tablet 3  . benazepril (LOTENSIN) 10 MG tablet Take 1 tablet (10 mg total) by mouth daily. (Patient taking differently: Take 10 mg by mouth every morning. IN CONJUNCTION WITH ONE 5 MG. TABLET TO EQUAL A TOTAL DOSE OF 15 MILLIGRAMS) 135 tablet 1  . benazepril (LOTENSIN) 5 MG tablet Take 1 tablet (5 mg total) by mouth daily. Take 1 tablet daily with the 10 mg tablet of Benazepril = 15 mg daily (Patient taking differently: Take 5 mg by mouth every morning. IN CONJUNCTION WITH ONE 10 MG. TABLET TO EQUAL A TOTAL DOSE OF 15 MILLIGRAMS) 90 tablet 3  . fluticasone (FLONASE) 50 MCG/ACT nasal spray Place 2 sprays into both nostrils daily. (Patient taking differently: Place 2 sprays into both nostrils daily as needed for allergies. ) 9.9 g 1  . furosemide (LASIX) 40 MG tablet Take 1 tablet (40 mg total) by mouth every other day. 45 tablet 1  . isosorbide mononitrate (IMDUR) 30 MG 24 hr tablet Take 0.5 tablets (15 mg total) by mouth daily. (Patient taking differently: Take 15 mg by mouth every morning. ) 30 tablet 6  . lactulose (CHRONULAC) 10 GM/15ML solution Take 15 mLs (10 g total) by mouth daily as needed for mild constipation. 240 mL 1  . levothyroxine (SYNTHROID, LEVOTHROID) 112 MCG tablet TAKE 1 TABLET BY MOUTH DAILY BEFORE BREAKFAST. (Patient taking differently: Take 112 mcg by mouth in the morning before breakfast) 90 tablet 0  . metFORMIN (GLUCOPHAGE) 500 MG tablet Take 1 tablet (500 mg total) by mouth daily with breakfast. (Patient taking differently: Take 500 mg by mouth 3 (three) times a week. ) 90 tablet 1  . Multiple Vitamins-Minerals (MULTIVITAMIN GUMMIES MENS PO) Take 1 tablet by mouth daily with breakfast.     . nitroGLYCERIN (NITROSTAT) 0.4 MG SL tablet Place 1 tablet (0.4 mg total) under the tongue every 5 (five) minutes as needed for chest pain. Up to 3 doses 10 tablet 2  . OXYGEN Inhale 2 L into the  lungs at bedtime.    . pantoprazole (PROTONIX) 40 MG tablet Take 1 tablet (40 mg total) by mouth daily. (Patient taking differently: Take 40 mg by mouth every morning. ) 90 tablet 0  . traMADol (ULTRAM) 50 MG tablet Take 1 tablet (50 mg total) by mouth 2 (two) times daily. (Patient taking differently: Take 50 mg by mouth 2 (two) times daily. MORNING AND BEDTIME) 180 tablet 0  . traMADol (ULTRAM-ER) 200  MG 24 hr tablet Take 1 tablet (200 mg total) by mouth daily. (Patient taking differently: Take 200 mg by mouth every morning. ) 90 tablet 0  . vitamin B-12 1000 MCG tablet Take 1 tablet (1,000 mcg total) by mouth daily. 30 tablet 0  . warfarin (COUMADIN) 2.5 MG tablet TAKE AS DIRECTED BY ANTICOAGULATION CLINIC (Patient taking differently: Take 1.25 mg by mouth in the morning on Sun/Tues/Thurs/Sat and 2.5 mg on Mon/Wed/Fri) 100 tablet 1   No current facility-administered medications on file prior to visit.     BP 131/86 (BP Location: Right Arm, Cuff Size: Large)   Pulse 77   Temp 98.5 F (36.9 C) (Oral)   Resp 16   Ht 6\' 3"  (1.905 m)   Wt 263 lb 12.8 oz (119.7 kg)   SpO2 97%   BMI 32.97 kg/m    Objective:   Physical Exam  Constitutional: He is oriented to person, place, and time. He appears well-developed and well-nourished. No distress.  HENT:  Head: Normocephalic and atraumatic.  Cardiovascular: Normal rate and regular rhythm.  No murmur heard. Pulmonary/Chest: Effort normal and breath sounds normal. No respiratory distress. He has no wheezes. He has no rales.  Musculoskeletal: He exhibits no edema.  Neurological: He is alert and oriented to person, place, and time.  RUE strength is 5/5, LUE strength is 5/5 with 4-5/5 left sided hand grip, 5/5 right sided hand grip Bilateral LE strength is 5/5  Skin: Skin is warm and dry.  Psychiatric: He has a normal mood and affect. His behavior is normal. Thought content normal.          Assessment & Plan:  TIA-he has no residual deficits  from his most recent TIA.  His left upper extremity hand grasp weakness is at his baseline.  I discussed with him that her neurology recommendations they did recommend that he continue aspirin until his INR is therapeutic.  INR today is subtherapeutic at 1.4.  I have advised him to take a full tablet of Coumadin which is 2.5 mg tonight and tomorrow night and to keep his appointment with the Coumadin clinic on Wednesday.  I have also advised him to  restart aspirin 325 mg.  We will plan to discontinue aspirin when INR is greater than or equal to 2.0.  I have also ordered a carotid Doppler to be completed as part of his TIA workup.  Diabetes type 2-he takes metformin on an every other day basis.  I have advised him to take once daily.

## 2017-01-19 NOTE — Telephone Encounter (Signed)
Pt seen in office today. He states he has not taken any Aspirin since being discharged from the hospital as he thought the aspirin and coumadin together would make his INR level low. PCP will discuss with pt.

## 2017-01-21 ENCOUNTER — Ambulatory Visit (INDEPENDENT_AMBULATORY_CARE_PROVIDER_SITE_OTHER): Payer: Medicare Other | Admitting: *Deleted

## 2017-01-21 DIAGNOSIS — I4891 Unspecified atrial fibrillation: Secondary | ICD-10-CM

## 2017-01-21 DIAGNOSIS — I481 Persistent atrial fibrillation: Secondary | ICD-10-CM | POA: Diagnosis not present

## 2017-01-21 DIAGNOSIS — I6300 Cerebral infarction due to thrombosis of unspecified precerebral artery: Secondary | ICD-10-CM | POA: Diagnosis not present

## 2017-01-21 DIAGNOSIS — Z7901 Long term (current) use of anticoagulants: Secondary | ICD-10-CM

## 2017-01-21 DIAGNOSIS — I480 Paroxysmal atrial fibrillation: Secondary | ICD-10-CM | POA: Diagnosis not present

## 2017-01-21 DIAGNOSIS — I6930 Unspecified sequelae of cerebral infarction: Secondary | ICD-10-CM | POA: Diagnosis not present

## 2017-01-21 DIAGNOSIS — I4819 Other persistent atrial fibrillation: Secondary | ICD-10-CM

## 2017-01-21 LAB — POCT INR: INR: 1.9

## 2017-01-21 MED ORDER — WARFARIN SODIUM 2.5 MG PO TABS: ORAL_TABLET | ORAL | 0 refills | Status: DC

## 2017-01-23 ENCOUNTER — Encounter: Payer: Self-pay | Admitting: Family

## 2017-01-23 ENCOUNTER — Ambulatory Visit
Admission: RE | Admit: 2017-01-23 | Discharge: 2017-01-23 | Disposition: A | Discharge status: Home / Self Care | Payer: Medicare Other | Source: Ambulatory Visit | Attending: Family | Admitting: Family

## 2017-01-23 DIAGNOSIS — I6523 Occlusion and stenosis of bilateral carotid arteries: Secondary | ICD-10-CM | POA: Diagnosis not present

## 2017-01-23 DIAGNOSIS — G459 Transient cerebral ischemic attack, unspecified: Secondary | ICD-10-CM

## 2017-01-27 NOTE — Addendum Note (Signed)
Encounter addended by: Sherran Needs, NP on: 01/27/2017 8:42 AM  Actions taken: LOS modified

## 2017-01-30 ENCOUNTER — Ambulatory Visit (INDEPENDENT_AMBULATORY_CARE_PROVIDER_SITE_OTHER): Payer: Medicare Other | Admitting: *Deleted

## 2017-01-30 DIAGNOSIS — Z7901 Long term (current) use of anticoagulants: Secondary | ICD-10-CM | POA: Diagnosis not present

## 2017-01-30 DIAGNOSIS — I4891 Unspecified atrial fibrillation: Secondary | ICD-10-CM

## 2017-01-30 LAB — POCT INR: INR: 3

## 2017-01-30 NOTE — Patient Instructions (Addendum)
Continue on same dosage 1/2 tablet everyday except 1 tablet on Mondays, Wednesdays, and Fridays. Have something dark green leafy today & remain consistent.  Recheck INR 2 weeks.

## 2017-02-03 ENCOUNTER — Other Ambulatory Visit: Payer: Self-pay | Admitting: Cardiology

## 2017-02-12 ENCOUNTER — Ambulatory Visit (INDEPENDENT_AMBULATORY_CARE_PROVIDER_SITE_OTHER): Payer: Medicare Other | Admitting: *Deleted

## 2017-02-12 DIAGNOSIS — I4891 Unspecified atrial fibrillation: Secondary | ICD-10-CM

## 2017-02-12 DIAGNOSIS — Z7901 Long term (current) use of anticoagulants: Secondary | ICD-10-CM

## 2017-02-12 LAB — POCT INR: INR: 2.3

## 2017-02-12 NOTE — Patient Instructions (Addendum)
Continue taking 1/2 tablet everyday except 1 tablet on Mondays, Wednesdays, and Fridays. Remain consistent with dark green leafy intake. Recheck INR 3 weeks.

## 2017-02-19 ENCOUNTER — Ambulatory Visit (INDEPENDENT_AMBULATORY_CARE_PROVIDER_SITE_OTHER): Payer: Medicare Other | Admitting: Medical

## 2017-02-19 ENCOUNTER — Encounter: Payer: Self-pay | Admitting: Medical

## 2017-02-19 VITALS — BP 142/86 | HR 80 | Temp 98.3°F | Resp 16 | Ht 75.0 in | Wt 267.4 lb

## 2017-02-19 DIAGNOSIS — I25119 Atherosclerotic heart disease of native coronary artery with unspecified angina pectoris: Secondary | ICD-10-CM | POA: Diagnosis not present

## 2017-02-19 DIAGNOSIS — R059 Cough, unspecified: Secondary | ICD-10-CM

## 2017-02-19 DIAGNOSIS — J0100 Acute maxillary sinusitis, unspecified: Secondary | ICD-10-CM

## 2017-02-19 DIAGNOSIS — R062 Wheezing: Secondary | ICD-10-CM

## 2017-02-19 DIAGNOSIS — R05 Cough: Secondary | ICD-10-CM

## 2017-02-19 DIAGNOSIS — J40 Bronchitis, not specified as acute or chronic: Secondary | ICD-10-CM

## 2017-02-19 MED ORDER — BENZONATATE 200 MG PO CAPS: 200.0000 mg | ORAL_CAPSULE | Freq: Three times a day (TID) | ORAL | 0 refills | Status: DC | PRN

## 2017-02-19 MED ORDER — FLUTICASONE PROPIONATE 50 MCG/ACT NA SUSP: 2.0000 | Freq: Every day | NASAL | 1 refills | Status: DC

## 2017-02-19 MED ORDER — DOXYCYCLINE HYCLATE 100 MG PO TABS: 100.0000 mg | ORAL_TABLET | Freq: Two times a day (BID) | ORAL | 0 refills | Status: DC

## 2017-02-19 MED ORDER — ALBUTEROL SULFATE HFA 108 (90 BASE) MCG/ACT IN AERS: 2.0000 | INHALATION_SPRAY | Freq: Four times a day (QID) | RESPIRATORY_TRACT | 0 refills | Status: DC | PRN

## 2017-02-19 NOTE — Progress Notes (Signed)
Subjective:    Patient ID: [name], male    DOB: [birth year], 71 y.o.   MRN: [id number]  HPI  Pt in for recent cough, congestion and runny nose. Pt states feels pnd. Pt has some occasional productive cough. Pt had symptoms for 3 days. No diffuse flu like body ache per pt.  Some sweating and chills at times very briefly.  Some night time wheezing.   Review of Systems  Constitutional: Positive for chills, diaphoresis and fatigue. Negative for fever.  HENT: Positive for congestion and postnasal drip. Negative for sore throat and trouble swallowing.   Respiratory: Positive for cough and wheezing. Negative for chest tightness and shortness of breath.   Cardiovascular: Negative for chest pain and palpitations.  Gastrointestinal: Negative for abdominal pain, constipation, diarrhea, nausea and vomiting.  Musculoskeletal: Positive for back pain.       Hx of back pain.  Skin: Negative for rash.  Neurological: Negative for dizziness, speech difficulty, numbness and headaches.  Hematological: Negative for adenopathy. Does not bruise/bleed easily.  Psychiatric/Behavioral: Negative for behavioral problems and confusion.   Past Medical History:  Diagnosis Date  . CAD (coronary artery disease)    s/p CABG, s/p DES to LCX  January 2011  . Chronic pain    left sided-Kristeins  . CVA (cerebral infarction)    left hemiparesis  . Diastolic CHF ([place])   . Dysrhythmia    hx of atrial fibrilation with cardioversion  . GERD (gastroesophageal reflux disease)   . Gout   . Hyperlipidemia   . Hypertension   . Hypothyroidism   . OSA on CPAP   . Sleep apnea   . Stroke ([place]) 1996  . Tubular adenoma of colon 05/2010     Social History   Socioeconomic History  . Marital status: Married    Spouse name: [name]  . Number of children: Not on file  . Years of education: 13  . Highest education level: Not on file  Social Needs  . Financial resource strain: Not on file  . Food insecurity -  worry: Not on file  . Food insecurity - inability: Not on file  . Transportation needs - medical: Not on file  . Transportation needs - non-medical: Not on file  Occupational History  . 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 Smoking status: Never Smoker  . Smokeless tobacco: Never Used  Substance and Sexual Activity  . Alcohol use: No    Alcohol/week: 0.0 oz    Comment: 1 beer a month  . Drug use: No  . Sexual activity: No  Other Topics Concern  . Not on file  Social History Narrative   Pt lives with wife. Does have stairs, but patient doesn't use them. Pt has completed technical school    Past Surgical History:  Procedure Laterality Date  . CARDIAC CATHETERIZATION N/A 10/25/2015   Procedure: Left Heart Cath and Cors/Grafts Angiography;  Surgeon: Troy Sine, MD;  Location: [place] [facility];  Service: Cardiovascular;  Laterality: N/A;  . CARDIOVERSION  06/20/2011   Procedure: CARDIOVERSION;  Surgeon: Larey Dresser, MD;  Location: [facility];  Service: Cardiovascular;  Laterality: N/A;  . CARDIOVERSION N/A 04/26/2015   Procedure: CARDIOVERSION;  Surgeon: Dorothy Spark, MD;  Location: [facility] ENDOSCOPY;  Service: Cardiovascular;  Laterality: N/A;  . CARDIOVERSION N/A 05/10/2015   Procedure: CARDIOVERSION;  Surgeon: Larey Dresser, MD;  Location: [place];  Service:  Cardiovascular;  Laterality: N/A;  . CATARACT EXTRACTION  05/2010   left eye  . CATARACT EXTRACTION  04/2010   right eye  . CORONARY ARTERY BYPASS GRAFT     stent  . ESOPHAGOGASTRODUODENOSCOPY  03-25-2005  . NASAL SEPTUM SURGERY    . PERCUTANEOUS PLACEMENT INTRAVASCULAR STENT CERVICAL CAROTID ARTERY     03-2009; using a drug-eluting platform of the circumflex cornoray artery with a 3.0 x 18 [employer] Promus drug-eluting platform post dilated to 3.75 with a noncompliant balloon.  . TEE WITHOUT CARDIOVERSION  06/20/2011   Procedure: TRANSESOPHAGEAL ECHOCARDIOGRAM  (TEE);  Surgeon: Larey Dresser, MD;  Location: [facility] ENDOSCOPY;  Service: Cardiovascular;  Laterality: N/A;    Family History  Problem Relation Age of Onset  . Lung cancer Father        deceased  . Stroke Mother        deceased-MINISTROKES    No Known Allergies  Current Outpatient Medications on File Prior to Visit  Medication Sig Dispense Refill  . albuterol (PROVENTIL HFA;VENTOLIN HFA) 108 (90 Base) MCG/ACT inhaler Inhale 1-2 puffs into the lungs every 6 (six) hours as needed for wheezing or shortness of breath.     . allopurinol (ZYLOPRIM) 100 MG tablet TAKE 2 TABLETS EVERY DAY (Patient taking differently: Take 200 mg by mouth in the morning) 180 tablet 1  . aspirin EC 325 MG tablet Take 1 tablet (325 mg total) daily by mouth. 30 tablet 0  . atorvastatin (LIPITOR) 40 MG tablet TAKE 2 TABLETS (80 MG TOTAL) BY MOUTH DAILY. (Patient taking differently: Take 80 mg by mouth at bedtime. ) 180 tablet 3  . benazepril (LOTENSIN) 10 MG tablet Take 1 tablet (10 mg total) by mouth daily. (Patient taking differently: Take 10 mg by mouth every morning. IN CONJUNCTION WITH ONE 5 MG. TABLET TO EQUAL A TOTAL DOSE OF 15 MILLIGRAMS) 135 tablet 1  . benazepril (LOTENSIN) 5 MG tablet Take 1 tablet (5 mg total) by mouth daily. Take 1 tablet daily with the 10 mg tablet of Benazepril = 15 mg daily (Patient taking differently: Take 5 mg by mouth every morning. IN CONJUNCTION WITH ONE 10 MG. TABLET TO EQUAL A TOTAL DOSE OF 15 MILLIGRAMS) 90 tablet 3  . fluticasone (FLONASE) 50 MCG/ACT nasal spray Place 2 sprays into both nostrils daily. (Patient taking differently: Place 2 sprays into both nostrils daily as needed for allergies. ) 9.9 g 1  . furosemide (LASIX) 40 MG tablet Take 1 tablet (40 mg total) by mouth every other day. 45 tablet 1  . isosorbide mononitrate (IMDUR) 30 MG 24 hr tablet Take 0.5 tablets (15 mg total) by mouth daily. (Patient taking differently: Take 15 mg by mouth every morning. ) 30 tablet 6  .  lactulose (CHRONULAC) 10 GM/15ML solution Take 15 mLs (10 g total) by mouth daily as needed for mild constipation. 240 mL 1  . levothyroxine (SYNTHROID, LEVOTHROID) 112 MCG tablet TAKE 1 TABLET BY MOUTH DAILY BEFORE BREAKFAST. 90 tablet 0  . metFORMIN (GLUCOPHAGE) 500 MG tablet Take 1 tablet (500 mg total) daily with breakfast by mouth. 90 tablet 1  . Multiple Vitamins-Minerals (MULTIVITAMIN GUMMIES MENS PO) Take 1 tablet by mouth daily with breakfast.     . nitroGLYCERIN (NITROSTAT) 0.4 MG SL tablet Place 1 tablet (0.4 mg total) under the tongue every 5 (five) minutes as needed for chest pain. Up to 3 doses 10 tablet 2  . OXYGEN Inhale 2 L into the lungs at bedtime.    Marland Kitchen  pantoprazole (PROTONIX) 40 MG tablet Take 1 tablet (40 mg total) every morning by mouth. 90 tablet 1  . traMADol (ULTRAM) 50 MG tablet Take 1 tablet (50 mg total) by mouth 2 (two) times daily. (Patient taking differently: Take 50 mg by mouth 2 (two) times daily. MORNING AND BEDTIME) 180 tablet 0  . traMADol (ULTRAM-ER) 200 MG 24 hr tablet Take 1 tablet (200 mg total) by mouth daily. (Patient taking differently: Take 200 mg by mouth every morning. ) 90 tablet 0  . vitamin B-12 1000 MCG tablet Take 1 tablet (1,000 mcg total) by mouth daily. 30 tablet 0  . warfarin (COUMADIN) 2.5 MG tablet Take as directed by coumadin clinic 90 tablet 0   No current facility-administered medications on file prior to visit.     BP (!) 142/86   Pulse 80   Temp 98.3 F (36.8 C) (Oral)   Resp 16   Ht 6\' 3"  (1.905 m)   Wt 267 lb 6.4 oz (121.3 kg)   SpO2 95%   BMI 33.42 kg/m       Objective:   Physical Exam  General  Mental Status - Alert. General Appearance - Well groomed. Not in acute distress.  Skin Rashes- No Rashes.  HEENT Head- Normal. Ear Auditory Canal - Left- Normal. Right - Normal.Tympanic Membrane- Left- Normal. Right- Normal. Eye Sclera/Conjunctiva- Left- Normal. Right- Normal. Nose & Sinuses Nasal Mucosa- Left-  Boggy  and Congested. Right-  Boggy and  Congested.Bilateral maxillary and frontal sinus pressure. Mouth & Throat Lips: Upper Lip- Normal: no dryness, cracking, pallor, cyanosis, or vesicular eruption. Lower Lip-Normal: no dryness, cracking, pallor, cyanosis or vesicular eruption. Buccal Mucosa- Bilateral- No Aphthous ulcers. Oropharynx- No Discharge or Erythema. Tonsils: Characteristics- Bilateral- No Erythema or Congestion. Size/Enlargement- Bilateral- No enlargement. Discharge- bilateral-None.  Neck Neck- Supple. No Masses.   Chest and Lung Exam Auscultation: Breath Sounds:-Clear even and unlabored.  Cardiovascular Auscultation:Rythm- Regular, rate and rhythm. Murmurs & Other Heart Sounds:Ausculatation of the heart reveal- No Murmurs.  Lymphatic Head & Neck General Head & Neck Lymphatics: Bilateral: Description- No Localized lymphadenopathy.       Assessment & Plan:   You appear to have bronchitis and sinusitis. Rest hydrate and tylenol for fever. I am prescribing cough medicine benzonatate , and doxycycline antibiotic. For your nasal congestion could use flonase nasal spray.  You should gradually get better but if symptoms worsens recommend cxr.  For wheezing albuterol inhaler as instructed.  Follow up in 7-10 days or as needed  Pt last inr was 2.4 per pt. He is on coumadin. Told to repeat in 3 weeks by inr cllinic  Explained to pt I think doxy better choice than keflex but doxy can effect inr. Keflex usually considered won't effect but doxy better antibiotic. We both decided on doxy. He will contact coumadin clinic and likely get inr checked next week.  Cypher Paule, Percell Miller, PA-C

## 2017-02-19 NOTE — Patient Instructions (Signed)
You appear to have bronchitis and sinusitis. Rest hydrate and tylenol for fever. I am prescribing cough medicine benzonatate , and doxycycline antibiotic. For your nasal congestion could use flonase nasal spray.  You should gradually get better but if symptoms worsens recommend cxr.  For wheezing albuterol inhaler as instructed.  Follow up in 7-10 days or as needed

## 2017-03-05 ENCOUNTER — Ambulatory Visit (INDEPENDENT_AMBULATORY_CARE_PROVIDER_SITE_OTHER): Payer: Medicare Other | Admitting: Pharmacist

## 2017-03-05 ENCOUNTER — Encounter: Payer: Self-pay | Admitting: Neurology

## 2017-03-05 ENCOUNTER — Ambulatory Visit (INDEPENDENT_AMBULATORY_CARE_PROVIDER_SITE_OTHER): Payer: Medicare Other | Admitting: Neurology

## 2017-03-05 VITALS — BP 138/96 | HR 55 | Wt 266.0 lb

## 2017-03-05 DIAGNOSIS — I4891 Unspecified atrial fibrillation: Secondary | ICD-10-CM | POA: Diagnosis not present

## 2017-03-05 DIAGNOSIS — G459 Transient cerebral ischemic attack, unspecified: Secondary | ICD-10-CM | POA: Diagnosis not present

## 2017-03-05 DIAGNOSIS — Z7901 Long term (current) use of anticoagulants: Secondary | ICD-10-CM

## 2017-03-05 DIAGNOSIS — I25119 Atherosclerotic heart disease of native coronary artery with unspecified angina pectoris: Secondary | ICD-10-CM | POA: Diagnosis not present

## 2017-03-05 LAB — POCT INR: INR: 1.9

## 2017-03-05 NOTE — Patient Instructions (Signed)
Description   Take 1 tablet today, then continue taking 1/2 tablet everyday except 1 tablet on Mondays, Wednesdays, and Fridays. Remain consistent with dark green leafy intake. Recheck INR 4 weeks.

## 2017-03-05 NOTE — Progress Notes (Signed)
[facility]  PATIENT: [name] DOB: 1945-06-11  HISTORY FROM: patient     HISTORICAL  CHIEF COMPLAINT:  Chief Complaint  Patient presents with   Follow-up    Pt is a follow up from the hospital on 12/2016 right arm numb last stroke 1996    HISTORY OF PRESENT ILLNESS:     REVIEW OF SYSTEMS: Full 14 system review of systems performed and negative with exception of left sided weakness from previous stroke, left sided foot drop.  ALLERGIES: No Known Allergies  HOME MEDICATIONS: Outpatient Medications Prior to Visit  Medication Sig Dispense Refill   albuterol (PROVENTIL HFA;VENTOLIN HFA) 108 (90 Base) MCG/ACT inhaler Inhale 1-2 puffs into the lungs every 6 (six) hours as needed for wheezing or shortness of breath.      albuterol (PROVENTIL HFA;VENTOLIN HFA) 108 (90 Base) MCG/ACT inhaler Inhale 2 puffs into the lungs every 6 (six) hours as needed for wheezing or shortness of breath. 1 Inhaler 0   allopurinol (ZYLOPRIM) 100 MG tablet TAKE 2 TABLETS EVERY DAY (Patient taking differently: Take 200 mg by mouth in the morning) 180 tablet 1   atorvastatin (LIPITOR) 40 MG tablet TAKE 2 TABLETS (80 MG TOTAL) BY MOUTH DAILY. (Patient taking differently: Take 80 mg by mouth at bedtime. ) 180 tablet 3   benazepril (LOTENSIN) 10 MG tablet Take 1 tablet (10 mg total) by mouth daily. (Patient taking differently: Take 10 mg by mouth every morning. IN CONJUNCTION WITH ONE 5 MG. TABLET TO EQUAL A TOTAL DOSE OF 15 MILLIGRAMS) 135 tablet 1   benzonatate (TESSALON) 200 MG capsule Take 1 capsule (200 mg total) by mouth 3 (three) times daily as needed for cough. 30 capsule 0   doxycycline (VIBRA-TABS) 100 MG tablet Take 1 tablet (100 mg total) by mouth 2 (two) times daily. Can give caps or generic 20 tablet 0   fluticasone (FLONASE) 50 MCG/ACT nasal spray Place 2 sprays into both nostrils daily. (Patient taking differently: Place 2 sprays into both nostrils daily as  needed for allergies. ) 9.9 g 1   furosemide (LASIX) 40 MG tablet Take 1 tablet (40 mg total) by mouth every other day. 45 tablet 1   isosorbide mononitrate (IMDUR) 30 MG 24 hr tablet Take 0.5 tablets (15 mg total) by mouth daily. (Patient taking differently: Take 15 mg by mouth every morning. ) 30 tablet 6   lactulose (CHRONULAC) 10 GM/15ML solution Take 15 mLs (10 g total) by mouth daily as needed for mild constipation. 240 mL 1   levothyroxine (SYNTHROID, LEVOTHROID) 112 MCG tablet TAKE 1 TABLET BY MOUTH DAILY BEFORE BREAKFAST. 90 tablet 0   metFORMIN (GLUCOPHAGE) 500 MG tablet Take 1 tablet (500 mg total) daily with breakfast by mouth. 90 tablet 1   Multiple Vitamins-Minerals (MULTIVITAMIN GUMMIES MENS PO) Take 1 tablet by mouth daily with breakfast.      nitroGLYCERIN (NITROSTAT) 0.4 MG SL tablet Place 1 tablet (0.4 mg total) under the tongue every 5 (five) minutes as needed for chest pain. Up to 3 doses 10 tablet 2   OXYGEN Inhale 2 L into the lungs at bedtime.     pantoprazole (PROTONIX) 40 MG tablet Take 1 tablet (40 mg total) every morning by mouth. 90 tablet 1   traMADol (ULTRAM) 50 MG tablet Take 1 tablet (50 mg total) by mouth 2 (two) times daily. (Patient taking differently: Take 50 mg by mouth 2 (two) times daily. MORNING AND BEDTIME) 180 tablet 0   traMADol (  ULTRAM-ER) 200 MG 24 hr tablet Take 1 tablet (200 mg total) by mouth daily. (Patient taking differently: Take 200 mg by mouth every morning. ) 90 tablet 0   vitamin B-12 1000 MCG tablet Take 1 tablet (1,000 mcg total) by mouth daily. 30 tablet 0   warfarin (COUMADIN) 2.5 MG tablet Take as directed by coumadin clinic 90 tablet 0   aspirin EC 325 MG tablet Take 1 tablet (325 mg total) daily by mouth. 30 tablet 0   benazepril (LOTENSIN) 5 MG tablet Take 1 tablet (5 mg total) by mouth daily. Take 1 tablet daily with the 10 mg tablet of Benazepril = 15 mg daily (Patient not taking: Reported on 03/05/2017) 90 tablet 3    fluticasone (FLONASE) 50 MCG/ACT nasal spray Place 2 sprays into both nostrils daily. (Patient not taking: Reported on 03/05/2017) 16 g 1   No facility-administered medications prior to visit.     PAST MEDICAL HISTORY: Past Medical History:  Diagnosis Date   CAD (coronary artery disease)    s/p CABG, s/p DES to LCX  January 2011   Chronic pain    left sided-Kristeins   CVA (cerebral infarction)    left hemiparesis   Diastolic CHF ([facility])    Dysrhythmia    hx of atrial fibrilation with cardioversion   GERD (gastroesophageal reflux disease)    Gout    Hyperlipidemia    Hypertension    Hypothyroidism    OSA on CPAP    Sleep apnea    Stroke [facility]) 1996   Tubular adenoma of colon 05/2010    PAST SURGICAL HISTORY: Past Surgical History:  Procedure Laterality Date   CARDIAC CATHETERIZATION N/A 10/25/2015   Procedure: Left Heart Cath and Cors/Grafts Angiography;  Surgeon: Troy Sine, MD;  Location: [place] [facility];  Service: Cardiovascular;  Laterality: N/A;   CARDIOVERSION  06/20/2011   Procedure: CARDIOVERSION;  Surgeon: Larey Dresser, MD;  Location: [place];  Service: Cardiovascular;  Laterality: N/A;   CARDIOVERSION N/A 04/26/2015   Procedure: CARDIOVERSION;  Surgeon: Dorothy Spark, MD;  Location: [place];  Service: Cardiovascular;  Laterality: N/A;   CARDIOVERSION N/A 05/10/2015   Procedure: CARDIOVERSION;  Surgeon: Larey Dresser, MD;  Location: [place];  Service: Cardiovascular;  Laterality: N/A;   CATARACT EXTRACTION  05/2010   left eye   CATARACT EXTRACTION  04/2010   right eye   CORONARY ARTERY BYPASS GRAFT     stent   ESOPHAGOGASTRODUODENOSCOPY  03-25-2005   NASAL SEPTUM SURGERY     PERCUTANEOUS PLACEMENT INTRAVASCULAR STENT CERVICAL CAROTID ARTERY     03-2009; using a drug-eluting platform of the circumflex cornoray artery with a 3.0 x 18 [employer] Promus drug-eluting platform post dilated to 3.75 with a noncompliant  balloon.   TEE WITHOUT CARDIOVERSION  06/20/2011   Procedure: TRANSESOPHAGEAL ECHOCARDIOGRAM (TEE);  Surgeon: Larey Dresser, MD;  Location: [facility] ENDOSCOPY;  Service: Cardiovascular;  Laterality: N/A;    FAMILY HISTORY: Family History  Problem Relation Age of Onset   Lung cancer Father        deceased   Stroke Mother        deceased-MINISTROKES    SOCIAL HISTORY:  Social History   Socioeconomic History   Marital status: Married    Spouse name: [name]   Number of children: Not on file   Years of education: 14   Highest education level: Not on file  Social Needs   Financial resource strain: Not on file  Food insecurity - worry: Not on file   Food insecurity - inability: Not on file   Transportation needs - medical: Not on file   Transportation needs - non-medical: Not on file  Occupational History   Occupation: retired    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 Does have stairs, but patient doesn't use them. Pt has completed technical school     PHYSICAL EXAM  GENERAL EXAM/CONSTITUTIONAL: Vitals:  Vitals:   03/05/17 1122  BP: (!) 138/96  Pulse: (!) 55  Weight: 266 lb (120.7 kg)   Body mass index is 33.25 kg/m. No exam data present  Patient is in no distress; well developed, nourished and groomed; neck is supple  CARDIOVASCULAR:  Examination of carotid arteries is normal; no carotid bruits  Regular rate and rhythm, no murmurs  Examination of peripheral vascular system by observation and palpation is normal  EYES:  Ophthalmoscopic exam of optic discs and posterior segments is normal; no papilledema or  hemorrhages  MUSCULOSKELETAL:  Gait, strength, tone, movements noted in Neurologic exam below  NEUROLOGIC:  awake, alert, oriented to person, place and time  DECR recent and remote memory   normal attention and concentration  language fluent, comprehension intact, naming intact,   fund of knowledge appropriate  CRANIAL NERVE:   2nd - no papilledema on fundoscopic exam  2nd, 3rd, 4th, 6th - pupils equal and reactive to light, visual fields full to confrontation, extraocular muscles intact, no nystagmus  5th - facial sensation --> DECR IN LEFT FACE  7th - facial strength symmetric  8th - hearing intact  9th - palate elevates symmetrically, uvula midline  11th - shoulder shrug symmetric  12th - tongue protrusion midline  MOTOR:   normal bulk; INCR TONE IN LUE AND LLE  RUE AND RLE 5/5  LUE 4 PROX AND 3 DISTAL  LLE 4   SENSORY:   normal and symmetric to light touch, pinprick, temperature, vibration IN RIGHT SIDE  DECR IN LEFT FACE, ARM, LEG TO ALL MODALITIES  DECR VIB IN RIGHT FOOT (<5 SEC) AND LEFT FOOT (BARELY NOTICES VIB)  COORDINATION:   finger-nose-finger, fine finger movements SLOW IN LEFT SIDE  REFLEXES:   deep tendon 1+ present and symmetric  ABSENT AT ANKLES  GAIT/STATION:   LEFT EHMIPARETIC GAIT; USES WALKER  SHORT STEPS  UNSTEADY GAIT    DIAGNOSTIC DATA (LABS, IMAGING, TESTING) - I reviewed patient records, labs, notes, testing and imaging myself where available.  Lab Results  Component Value Date   WBC 5.8 01/13/2017   HGB 13.8 01/13/2017   HCT 41.6 01/13/2017   MCV 91.0 01/13/2017   PLT 153 01/13/2017      Component Value Date/Time   NA 138 01/13/2017 1522   NA 143 09/18/2016 1158   K 4.1 01/13/2017 1522   CL 103 01/13/2017 1522   CO2 25 01/13/2017 1522   GLUCOSE 98 01/13/2017 1522   BUN 15 01/13/2017 1522   BUN 14 09/18/2016 1158   CREATININE 1.11 01/13/2017 1522   CREATININE 1.27 (H) 11/05/2015 1532   CALCIUM  9.2 01/13/2017 1522  PROT 7.1 01/13/2017 1522   PROT 6.6 09/18/2016 1158   ALBUMIN 4.0 01/13/2017 1522   ALBUMIN 4.1 09/18/2016 1158   AST 32 01/13/2017 1522   ALT 31 01/13/2017 1522   ALKPHOS 60 01/13/2017 1522   BILITOT 0.8 01/13/2017 1522   BILITOT 0.4 09/18/2016 1158   GFRNONAA >60 01/13/2017 1522   GFRNONAA 73 06/25/2012 0826   GFRAA >60 01/13/2017 1522   GFRAA 84 06/25/2012 0826   Lab Results  Component Value Date   CHOL 134 01/14/2017   HDL 45 01/14/2017   LDLCALC 62 01/14/2017   TRIG 136 01/14/2017   CHOLHDL 3.0 01/14/2017   Lab Results  Component Value Date   HGBA1C 6.2 (H) 01/14/2017   Lab Results  Component Value Date   EHUDJSHF02 637 12/12/2015   Lab Results  Component Value Date   TSH 2.610 09/18/2016    11/25/14 MRI brain [I reviewed images myself and agree with interpretation. -VRP]  1. The right basal ganglia hemorrhagic infarcts have expanded since the prior exam without evidence for acute infarct. 2. Atrophy and diffuse white matter disease is otherwise stable. 3. The study is moderately degraded by patient motion.  12/08/15 MRI lumbar spine [I reviewed images myself and agree with interpretation. -VRP]  - Multilevel spondylosis. Central canal narrowing appears worst at L3-4 where there is moderate to moderately severe central canal stenosis with narrowing of both lateral recesses. No nerve root compression is identified. Multilevel facet arthropathy is also identified. Please see above for descriptions of individual levels. - Mild anterior and remote superior endplate compression fracture of L1.  02/20/16 CT head [I reviewed images myself and agree with interpretation. -VRP]  - Extensive chronic microvascular disease throughout the deep white matter. No acute intracranial abnormality.     ASSESSMENT AND PLAN  71 y.o. year old male here with gradually progressive gait and balance difficulty in last 2-3 years, especially in last 2-3 months.  History of stroke in 1996 (with residual left hemiparesis and left hemi-sensory loss). History of lumbar spinal stenosis (moderate-severe).   Dx: gait difficulty due late effects of stroke + lumbar spinal stenosis with neurogenic claudication  No diagnosis found.   PLAN: - recommend conservative approach (PT, exercises, rollator walker, safety and fall precautions); patient will think about these and let me or PCP know if he wants to pursue these - patient not interested in surgical treatments for lumbar spinal stenosis at this time  No orders of the defined types were placed in this encounter.  Return in about 6 months (around 09/03/2017).    Penni Bombard, MD 85/88/5027, 74:12 AM Certified in Neurology, Neurophysiology and Neuroimaging  [facility] [address] [place], [place] [address] [phone]

## 2017-03-05 NOTE — Progress Notes (Signed)
PATIENT: [name] DOB: [birth year]  REASON FOR VISIT: follow up HISTORY FROM: patient  HISTORY OF PRESENT ILLNESS: 71 year old male with history of stroke in 1996, with resultant left-sided numbness and weakness, here for evaluation of ongoing balance and gait difficulty. Patient has had gradually worsening gait and balance difficulty for several years. He's been using a walker for the past 2 months. He feels worsening balance, numbness and weakness in his lower extremity. He denies any low back pain. Patient feels as though he could fall down. Patient feels worse symptoms if he stands and walks for a long time. Some symptoms relieved if he sits down. Patient was admitted to the hospital in September 2017 for acute toxic metabolic encephalopathy, acute on chronic hypoxic respiratory failure, acute bronchitis, low back pain. Update 03/05/17: The patient is seen today for the first office follow-up visit following hospital admission for TIA in October 2018. History is obtained from the patient and review of electronic medical records. I have personally reviewed imaging films. On 01/13/2017, pt presented to [facility] for right arm numbness and right facial droop. CT negative for acute abnormality. MRI negative for new strokes. All right sided symptoms resolved. TIA secondary to subtherapeutic INR 1.7 on warfarin for hx of atrial fibrillation. Patient offered to start Eliquis in ED but patient declined wanting to stay on warfarin. Aspirin was added to warfarin until INR became therapeutic but seems like it was never discontinued. INR currently therapeutic at 2.3 (02/12/17). Lipitor was also increased to 80mg  from 40mg  as LDL 80 on admission. He denies any recurrent stroke or TIA symptoms. He states his blood pressure is well controlled and today it is 138/96  REVIEW OF SYSTEMS: Out of a complete 14 system review of symptoms, the patient complains only of the following symptoms: residual left side  weakness from previous stroke and left food drop; and all other reviewed systems are negative.  ALLERGIES: No Known Allergies  HOME MEDICATIONS: Outpatient Medications Prior to Visit  Medication Sig Dispense Refill   albuterol (PROVENTIL HFA;VENTOLIN HFA) 108 (90 Base) MCG/ACT inhaler Inhale 1-2 puffs into the lungs every 6 (six) hours as needed for wheezing or shortness of breath.      albuterol (PROVENTIL HFA;VENTOLIN HFA) 108 (90 Base) MCG/ACT inhaler Inhale 2 puffs into the lungs every 6 (six) hours as needed for wheezing or shortness of breath. 1 Inhaler 0   allopurinol (ZYLOPRIM) 100 MG tablet TAKE 2 TABLETS EVERY DAY (Patient taking differently: Take 200 mg by mouth in the morning) 180 tablet 1   atorvastatin (LIPITOR) 40 MG tablet TAKE 2 TABLETS (80 MG TOTAL) BY MOUTH DAILY. (Patient taking differently: Take 80 mg by mouth at bedtime. ) 180 tablet 3   benazepril (LOTENSIN) 10 MG tablet Take 1 tablet (10 mg total) by mouth daily. (Patient taking differently: Take 10 mg by mouth every morning. IN CONJUNCTION WITH ONE 5 MG. TABLET TO EQUAL A TOTAL DOSE OF 15 MILLIGRAMS) 135 tablet 1   benzonatate (TESSALON) 200 MG capsule Take 1 capsule (200 mg total) by mouth 3 (three) times daily as needed for cough. 30 capsule 0   doxycycline (VIBRA-TABS) 100 MG tablet Take 1 tablet (100 mg total) by mouth 2 (two) times daily. Can give caps or generic 20 tablet 0   fluticasone (FLONASE) 50 MCG/ACT nasal spray Place 2 sprays into both nostrils daily. (Patient taking differently: Place 2 sprays into both nostrils daily as needed for allergies. ) 9.9 g 1   furosemide (  LASIX) 40 MG tablet Take 1 tablet (40 mg total) by mouth every other day. 45 tablet 1   isosorbide mononitrate (IMDUR) 30 MG 24 hr tablet Take 0.5 tablets (15 mg total) by mouth daily. (Patient taking differently: Take 15 mg by mouth every morning. ) 30 tablet 6   lactulose (CHRONULAC) 10 GM/15ML solution Take 15 mLs (10 g total) by  mouth daily as needed for mild constipation. 240 mL 1   levothyroxine (SYNTHROID, LEVOTHROID) 112 MCG tablet TAKE 1 TABLET BY MOUTH DAILY BEFORE BREAKFAST. 90 tablet 0   metFORMIN (GLUCOPHAGE) 500 MG tablet Take 1 tablet (500 mg total) daily with breakfast by mouth. 90 tablet 1   Multiple Vitamins-Minerals (MULTIVITAMIN GUMMIES MENS PO) Take 1 tablet by mouth daily with breakfast.      nitroGLYCERIN (NITROSTAT) 0.4 MG SL tablet Place 1 tablet (0.4 mg total) under the tongue every 5 (five) minutes as needed for chest pain. Up to 3 doses 10 tablet 2   OXYGEN Inhale 2 L into the lungs at bedtime.     pantoprazole (PROTONIX) 40 MG tablet Take 1 tablet (40 mg total) every morning by mouth. 90 tablet 1   traMADol (ULTRAM) 50 MG tablet Take 1 tablet (50 mg total) by mouth 2 (two) times daily. (Patient taking differently: Take 50 mg by mouth 2 (two) times daily. MORNING AND BEDTIME) 180 tablet 0   traMADol (ULTRAM-ER) 200 MG 24 hr tablet Take 1 tablet (200 mg total) by mouth daily. (Patient taking differently: Take 200 mg by mouth every morning. ) 90 tablet 0   vitamin B-12 1000 MCG tablet Take 1 tablet (1,000 mcg total) by mouth daily. 30 tablet 0   warfarin (COUMADIN) 2.5 MG tablet Take as directed by coumadin clinic 90 tablet 0   aspirin EC 325 MG tablet Take 1 tablet (325 mg total) daily by mouth. 30 tablet 0   benazepril (LOTENSIN) 5 MG tablet Take 1 tablet (5 mg total) by mouth daily. Take 1 tablet daily with the 10 mg tablet of Benazepril = 15 mg daily (Patient not taking: Reported on 03/05/2017) 90 tablet 3   fluticasone (FLONASE) 50 MCG/ACT nasal spray Place 2 sprays into both nostrils daily. (Patient not taking: Reported on 03/05/2017) 16 g 1   No facility-administered medications prior to visit.     PAST MEDICAL HISTORY: Past Medical History:  Diagnosis Date   CAD (coronary artery disease)    s/p CABG, s/p DES to LCX  January 2011   Chronic pain    left sided-[place]    CVA (cerebral infarction)    left hemiparesis   Diastolic CHF (HCC)    Dysrhythmia    hx of atrial fibrilation with cardioversion   GERD (gastroesophageal reflux disease)    Gout    Hyperlipidemia    Hypertension    Hypothyroidism    OSA on CPAP    Sleep apnea    Stroke [facility]) 1996   Tubular adenoma of colon 05/2010    PAST SURGICAL HISTORY: Past Surgical History:  Procedure Laterality Date   CARDIAC CATHETERIZATION N/A 10/25/2015   Procedure: Left Heart Cath and Cors/Grafts Angiography;  Surgeon: Troy Sine, MD;  Location: [facility];  Service: Cardiovascular;  Laterality: N/A;   CARDIOVERSION  06/20/2011   Procedure: CARDIOVERSION;  Surgeon: Larey Dresser, MD;  Location: [facility] ENDOSCOPY;  Service: Cardiovascular;  Laterality: N/A;   CARDIOVERSION N/A 04/26/2015   Procedure: CARDIOVERSION;  Surgeon: Dorothy Spark, MD;  Location: [facility]  ENDOSCOPY;  Service: Cardiovascular;  Laterality: N/A;   CARDIOVERSION N/A 05/10/2015   Procedure: CARDIOVERSION;  Surgeon: Larey Dresser, MD;  Location: [facility];  Service: Cardiovascular;  Laterality: N/A;   CATARACT EXTRACTION  05/2010   left eye   CATARACT EXTRACTION  04/2010   right eye   CORONARY ARTERY BYPASS GRAFT     stent   ESOPHAGOGASTRODUODENOSCOPY  03-25-2005   NASAL SEPTUM SURGERY     PERCUTANEOUS PLACEMENT INTRAVASCULAR STENT CERVICAL CAROTID ARTERY     03-2009; using a drug-eluting platform of the circumflex cornoray artery with a 3.0 x 18 [employer] Promus drug-eluting platform post dilated to 3.75 with a noncompliant balloon.   TEE WITHOUT CARDIOVERSION  06/20/2011   Procedure: TRANSESOPHAGEAL ECHOCARDIOGRAM (TEE);  Surgeon: Larey Dresser, MD;  Location: [facility] ENDOSCOPY;  Service: Cardiovascular;  Laterality: N/A;    FAMILY HISTORY: Family History  Problem Relation Age of Onset   Lung cancer Father        deceased   Stroke Mother        deceased-MINISTROKES    SOCIAL HISTORY: Social  History   Socioeconomic History   Marital status: Married    Spouse name: [name]   Number of children: Not on file   Years of education: 14   Highest education level: Not on file  Social Needs   Financial resource strain: Not on file   Food insecurity - worry: Not on file   Food insecurity - inability: Not on file   Transportation needs - medical: Not on file   Transportation needs - non-medical: Not on file  Occupational History   Occupation: retired    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 Does have stairs, but patient doesn't use them. Pt has completed technical school      PHYSICAL EXAM  Vitals:   03/05/17 1122  BP: (!) 138/96  Pulse: (!) 55  Weight: 266 lb (120.7 kg)   Body mass index is 33.25 kg/m.    Pleasant elderly Caucasian male not in distress. Afebrile. Head is nontraumatic. Neck is supple without bruit.    Cardiac exam no murmur or gallop. Lungs are clear to auscultation. Distal pulses are well felt.  Neurological examination  Mentation: Alert oriented to time, place, history taking. Follows all commands speech and language fluent Cranial nerve II-XII: Pupils were equal round reactive to light. Extraocular movements were full, visual field were full on confrontational test. Facial sensation and strength were normal. Uvula tongue midline. Head turning and shoulder shrug  were normal and symmetric. Motor: The motor testing reveals 5 over 5 strength of all 4 extremities. Good symmetric motor tone is noted throughout.  Sensory: Sensory testing is intact to soft touch on all 4 extremities. No evidence of extinction is noted.  Coordination:  Cerebellar testing reveals good finger-nose-finger and heel-to-shin bilaterally.  Gait and station: Gait noticeable left foot drop and uses walker. Tandem gait is normal.  No drift is seen.  Reflexes: Deep tendon reflexes are symmetric and normal bilaterally.   DIAGNOSTIC DATA (LABS, IMAGING, TESTING) - I reviewed patient records, labs,  notes, testing and imaging myself where available.  Lab Results  Component Value Date   WBC 5.8 01/13/2017   HGB 13.8 01/13/2017   HCT 41.6 01/13/2017   MCV 91.0 01/13/2017   PLT 153 01/13/2017      Component Value Date/Time   NA 138 01/13/2017 1522   NA 143 09/18/2016 1158   K 4.1 01/13/2017 1522   CL 103 01/13/2017 1522   CO2 25 01/13/2017 1522   GLUCOSE 98 01/13/2017 1522   BUN 15 01/13/2017 1522   BUN 14 09/18/2016 1158   CREATININE 1.11 01/13/2017 1522   CREATININE 1.27 (H) 11/05/2015 1532   CALCIUM 9.2 01/13/2017 1522   PROT 7.1 01/13/2017 1522   PROT 6.6 09/18/2016 1158   ALBUMIN 4.0 01/13/2017 1522   ALBUMIN 4.1 09/18/2016 1158   AST 32 01/13/2017 1522   ALT 31 01/13/2017 1522   ALKPHOS 60 01/13/2017 1522   BILITOT 0.8 01/13/2017 1522   BILITOT 0.4 09/18/2016 1158   GFRNONAA >60 01/13/2017 1522   GFRNONAA 73 06/25/2012 0826   GFRAA >60 01/13/2017 1522   GFRAA 84 06/25/2012 0826   Lab Results  Component Value Date   CHOL 134 01/14/2017   HDL 45 01/14/2017   LDLCALC 62 01/14/2017   TRIG 136 01/14/2017   CHOLHDL 3.0 01/14/2017   Lab Results  Component Value Date   HGBA1C 6.2 (H) 01/14/2017   Lab Results  Component Value Date   VITAMINB12 448 12/12/2015   Lab Results  Component Value Date   TSH 2.610 09/18/2016      ASSESSMENT AND PLAN 71 y.o. year old male  has a past medical history of CAD (coronary artery disease), Chronic pain, CVA (cerebral infarction), Diastolic CHF ([place]), Dysrhythmia, GERD (gastroesophageal reflux disease), Gout, Hyperlipidemia, Hypertension, Hypothyroidism, OSA on CPAP, Sleep apnea, Stroke  ([place]) (1996), and Tubular adenoma of colon (05/2010). here with TIA d/t subtherapeutic INR levels on warfarin. HLD controlled with lipitor LDL 62 (10/31).   I had a long d/w patient about his recent TIA, atrial fibrillation, risk for recurrent stroke/TIAs, personally independently reviewed imaging studies and stroke evaluation results and answered questions.Continue warfarin daily  for secondary stroke prevention and maintain strict control of hypertension with blood pressure goal below 130/90, diabetes with hemoglobin A1c goal below 6.5% and lipids with LDL cholesterol goal below 70 mg/dL. I also advised the patient to eat a healthy diet with plenty of whole grains, cereals, fruits and vegetables, exercise regularly and maintain ideal body weight .patient was advised to use a walker at all times for safety concerns greater than 50% time during this 25 minute visit was spent on counseling and coordination of care about his TIA, atrial fibrillation and answered questions Followup in the future with my nurse practitioner Janett Billow in 6 months or call earlier if necessary Antony Contras, [facility] [address] [place], [place] [address]  Phone [phone] Fax [phone]

## 2017-03-05 NOTE — Patient Instructions (Signed)
I had a long d/w patient about his recent TIA, atrial fibrillation, risk for recurrent stroke/TIAs, personally independently reviewed imaging studies and stroke evaluation results and answered questions.Continue warfarin daily  for secondary stroke prevention and maintain strict control of hypertension with blood pressure goal below 130/90, diabetes with hemoglobin A1c goal below 6.5% and lipids with LDL cholesterol goal below 70 mg/dL. I also advised the patient to eat a healthy diet with plenty of whole grains, cereals, fruits and vegetables, exercise regularly and maintain ideal body weight .patient was advised to use a walker at all times for safety concerns Followup in the future with my nurse practitioner Janett Billow in 6 months or call earlier if necessary

## 2017-03-12 ENCOUNTER — Other Ambulatory Visit: Payer: Self-pay

## 2017-03-12 MED ORDER — LEVOTHYROXINE SODIUM 112 MCG PO TABS: 112.0000 ug | ORAL_TABLET | Freq: Every day | ORAL | 0 refills | Status: DC

## 2017-03-31 ENCOUNTER — Encounter: Payer: Medicare Other | Attending: Physical Medicine & Rehabilitation

## 2017-03-31 ENCOUNTER — Encounter: Payer: Self-pay | Admitting: Physical Medicine & Rehabilitation

## 2017-03-31 ENCOUNTER — Ambulatory Visit (HOSPITAL_BASED_OUTPATIENT_CLINIC_OR_DEPARTMENT_OTHER): Payer: Medicare Other | Admitting: Physical Medicine & Rehabilitation

## 2017-03-31 VITALS — BP 135/82 | HR 84

## 2017-03-31 DIAGNOSIS — G8110 Spastic hemiplegia affecting unspecified side: Secondary | ICD-10-CM | POA: Diagnosis not present

## 2017-03-31 DIAGNOSIS — G8111 Spastic hemiplegia affecting right dominant side: Secondary | ICD-10-CM | POA: Diagnosis not present

## 2017-03-31 MED ORDER — TRAMADOL HCL 50 MG PO TABS: 50.0000 mg | ORAL_TABLET | Freq: Two times a day (BID) | ORAL | 0 refills | Status: DC

## 2017-03-31 MED ORDER — TRAMADOL HCL ER 200 MG PO TB24: 200.0000 mg | ORAL_TABLET | Freq: Every day | ORAL | 0 refills | Status: DC

## 2017-03-31 NOTE — Progress Notes (Signed)
Botox Injection for spasticity using needle EMG guidance  Dilution: 50 Units/ml Indication: Severe spasticity which interferes with ADL,mobility and/or  hygiene and is unresponsive to medication management and other conservative care Informed consent was obtained after describing risks and benefits of the procedure with the patient. This includes bleeding, bruising, infection, excessive weakness, or medication side effects. A REMS form is on file and signed. Needle: 27g 1" needle electrode Number of units per muscle  FDS 50U [id number]   [id number] All injections were done after obtaining appropriate EMG activity and after negative drawback for blood. The patient tolerated the procedure well. Post procedure instructions were given. A followup appointment was made.

## 2017-03-31 NOTE — Patient Instructions (Signed)

## 2017-04-06 ENCOUNTER — Emergency Department (HOSPITAL_COMMUNITY)
Admission: EM | Admit: 2017-04-06 | Discharge: 2017-04-06 | Disposition: A | Discharge status: Home / Self Care | Payer: Medicare Other | Attending: Emergency Medicine | Admitting: Emergency Medicine

## 2017-04-06 ENCOUNTER — Encounter: Payer: Self-pay | Admitting: Family

## 2017-04-06 ENCOUNTER — Encounter (HOSPITAL_COMMUNITY): Payer: Self-pay | Admitting: *Deleted

## 2017-04-06 ENCOUNTER — Emergency Department (HOSPITAL_COMMUNITY): Payer: Medicare Other

## 2017-04-06 DIAGNOSIS — I5032 Chronic diastolic (congestive) heart failure: Secondary | ICD-10-CM | POA: Insufficient documentation

## 2017-04-06 DIAGNOSIS — I110 Hypertensive heart disease with heart failure: Secondary | ICD-10-CM | POA: Insufficient documentation

## 2017-04-06 DIAGNOSIS — R4182 Altered mental status, unspecified: Secondary | ICD-10-CM | POA: Diagnosis not present

## 2017-04-06 DIAGNOSIS — R404 Transient alteration of awareness: Secondary | ICD-10-CM | POA: Diagnosis not present

## 2017-04-06 DIAGNOSIS — I2510 Atherosclerotic heart disease of native coronary artery without angina pectoris: Secondary | ICD-10-CM | POA: Insufficient documentation

## 2017-04-06 DIAGNOSIS — E039 Hypothyroidism, unspecified: Secondary | ICD-10-CM | POA: Diagnosis not present

## 2017-04-06 DIAGNOSIS — R220 Localized swelling, mass and lump, head: Secondary | ICD-10-CM | POA: Diagnosis not present

## 2017-04-06 DIAGNOSIS — Z951 Presence of aortocoronary bypass graft: Secondary | ICD-10-CM | POA: Diagnosis not present

## 2017-04-06 DIAGNOSIS — R42 Dizziness and giddiness: Secondary | ICD-10-CM | POA: Diagnosis not present

## 2017-04-06 DIAGNOSIS — R531 Weakness: Secondary | ICD-10-CM | POA: Diagnosis not present

## 2017-04-06 DIAGNOSIS — E119 Type 2 diabetes mellitus without complications: Secondary | ICD-10-CM | POA: Diagnosis not present

## 2017-04-06 DIAGNOSIS — I6529 Occlusion and stenosis of unspecified carotid artery: Secondary | ICD-10-CM | POA: Diagnosis not present

## 2017-04-06 LAB — CBC
HCT: 41.5 % (ref 39.0–52.0)
HEMOGLOBIN: 13.3 g/dL (ref 13.0–17.0)
MCH: 29.1 pg (ref 26.0–34.0)
MCHC: 32 g/dL (ref 30.0–36.0)
MCV: 90.8 fL (ref 78.0–100.0)
Platelets: 159 10*3/uL (ref 150–400)
RBC: 4.57 MIL/uL (ref 4.22–5.81)
RDW: 13.6 % (ref 11.5–15.5)
WBC: 8.1 10*3/uL (ref 4.0–10.5)

## 2017-04-06 LAB — URINALYSIS, ROUTINE W REFLEX MICROSCOPIC
Bilirubin Urine: NEGATIVE
Glucose, UA: NEGATIVE mg/dL
Hgb urine dipstick: NEGATIVE
Ketones, ur: NEGATIVE mg/dL
LEUKOCYTES UA: NEGATIVE
NITRITE: NEGATIVE
Protein, ur: NEGATIVE mg/dL
SPECIFIC GRAVITY, URINE: 1.023 (ref 1.005–1.030)
pH: 5 (ref 5.0–8.0)

## 2017-04-06 LAB — CK: CK TOTAL: 729 U/L — AB (ref 49–397)

## 2017-04-06 LAB — BASIC METABOLIC PANEL
ANION GAP: 12 (ref 5–15)
BUN: 14 mg/dL (ref 6–20)
CO2: 26 mmol/L (ref 22–32)
Calcium: 9 mg/dL (ref 8.9–10.3)
Chloride: 102 mmol/L (ref 101–111)
Creatinine, Ser: 1.14 mg/dL (ref 0.61–1.24)
GFR calc Af Amer: >60 mL/min (ref >60)
GLUCOSE: 111 mg/dL — AB (ref 65–99)
POTASSIUM: 4.1 mmol/L (ref 3.5–5.1)
Sodium: 140 mmol/L (ref 135–145)

## 2017-04-06 LAB — CBG MONITORING, ED: Glucose-Capillary: 102 mg/dL — ABNORMAL HIGH (ref 65–99)

## 2017-04-06 MED ORDER — SODIUM CHLORIDE 0.9 % IV BOLUS (SEPSIS)
1000.0000 mL | Freq: Once | INTRAVENOUS | Status: AC
  Administered 2017-04-06: 1000 mL via INTRAVENOUS

## 2017-04-06 MED ORDER — TRAMADOL HCL 50 MG PO TABS
150.0000 mg | ORAL_TABLET | Freq: Once | ORAL | Status: AC
  Administered 2017-04-06: 150 mg via ORAL
  Filled 2017-04-06: qty 3

## 2017-04-06 MED ORDER — CLINDAMYCIN HCL 300 MG PO CAPS: 300.0000 mg | ORAL_CAPSULE | Freq: Four times a day (QID) | ORAL | 0 refills | Status: DC

## 2017-04-06 MED ORDER — IOPAMIDOL (ISOVUE-370) INJECTION 76%
INTRAVENOUS | Status: AC
  Filled 2017-04-06: qty 100

## 2017-04-06 MED ORDER — CLINDAMYCIN HCL 150 MG PO CAPS
300.0000 mg | ORAL_CAPSULE | Freq: Once | ORAL | Status: AC
  Administered 2017-04-06: 300 mg via ORAL
  Filled 2017-04-06: qty 2

## 2017-04-06 NOTE — ED Notes (Signed)
Assisted pt to the restroom in wheelchair and back to room.

## 2017-04-06 NOTE — ED Provider Notes (Signed)
Emergency Department Provider Note   I have reviewed the triage vital signs and the nursing notes.   HISTORY  Chief Complaint Dizziness   HPI [name] is a 72 y.o. male with multiple medical problems as documented below the presents to the emergency department today secondary to dizziness and weakness.  Patient states that he sat up in bed he got dizzy and lightheaded and thus slid out of the bed onto the ground.  He did not fall or hit his head.  Denies syncope.  States this happened around 10:30 or 11:00 last night but a very sleepiness he cannot get anyone to come help him.  He also states he has been having an infection in his gums and teeth this been going on for a few days that he is to see the dentist for and feels like this infection may have made him weak.  Denies any headache, chest pain, palpitation, shortness of breath, back pain, abdominal pain or vision changes associated with the symptoms or at this time.  Does have residual left-sided weakness from a previous stroke and also endorses a previous TIA. No other associated or modifying symptoms.    Past Medical History:  Diagnosis Date  . CAD (coronary artery disease)    s/p CABG, s/p DES to LCX  January 2011  . Chronic pain    left sided-[place]  . CVA (cerebral infarction)    left hemiparesis  . Diastolic CHF ([place])   . Dysrhythmia    hx of atrial fibrilation with cardioversion  . GERD (gastroesophageal reflux disease)   . Gout   . Hyperlipidemia   . Hypertension   . Hypothyroidism   . OSA on CPAP   . Sleep apnea   . Stroke ([place]) 1996  . Tubular adenoma of colon 05/2010    Patient Active Problem List   Diagnosis Date Noted  . Stroke (cerebrum) ([place]) 01/13/2017  . RUQ abdominal pain 01/13/2017  . PAF (paroxysmal atrial fibrillation) ([place]) 12/06/2016  . Dyspnea on exertion 10/21/2016  . Acute respiratory failure with hypoxia and hypercapnia ([place]) 12/12/2015  . Coronary artery disease due to lipid  rich plaque 11/04/2015  . CAD S/P CFX DES 2011 04/25/2015  . Persistent atrial fibrillation ([place]) 04/25/2015  . Vasovagal syncope   . Constipation 04/24/2015  . Chronic diastolic heart failure ([place]) 04/24/2015  . B12 deficiency 08/11/2013  . Sensory disturbance 08/11/2013  . Ataxia 08/10/2013  . Insomnia 05/21/2013  . Hypothyroidism, acquired 02/15/2013  . Fatigue 11/17/2012  . Spastic hemiplegia affecting dominant side ([place]) 07/21/2011  . Long term current use of anticoagulant therapy 06/24/2011  . Hypogonadism male 04/10/2011  . Allergic rhinitis 02/13/2010  . BENIGN POSITIONAL VERTIGO 11/23/2009  . 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 Hypothyroidism 04/02/2009  . Type 2 diabetes mellitus without complication, without long-term current use of insulin ([place]) 10/05/2007  . Obstructive sleep apnea-Failed CPAP 06/17/2007  . Hyperlipidemia 12/12/2006  . GOUT 12/12/2006  . Essential hypertension 12/12/2006  . History of stroke with residual deficit 12/12/2006  . GERD 12/12/2006    Past Surgical History:  Procedure Laterality Date  . CARDIAC CATHETERIZATION N/A 10/25/2015   Procedure: Left Heart Cath and Cors/Grafts Angiography;  Surgeon: Troy Sine, MD;  Location: [facility];  Service: Cardiovascular;  Laterality: N/A;  . CARDIOVERSION  06/20/2011   Procedure: CARDIOVERSION;  Surgeon: Larey Dresser, MD;  Location[facility] ENDOSCOPY;  Service: Cardiovascular;  Laterality: N/A;  . CARDIOVERSION N/A 04/26/2015  Procedure: CARDIOVERSION;  Surgeon: Dorothy Spark, MD;  Location: [facility] ENDOSCOPY;  Service: Cardiovascular;  Laterality: N/A;  . CARDIOVERSION N/A 05/10/2015   Procedure: CARDIOVERSION;  Surgeon: Larey Dresser, MD;  Location: [facility];  Service: Cardiovascular;  Laterality: N/A;  . CATARACT EXTRACTION  05/2010   left eye  . CATARACT EXTRACTION  04/2010   right eye  . CORONARY ARTERY BYPASS GRAFT     stent  . ESOPHAGOGASTRODUODENOSCOPY  03-25-2005  . NASAL SEPTUM SURGERY      . PERCUTANEOUS PLACEMENT INTRAVASCULAR STENT CERVICAL CAROTID ARTERY     03-2009; using a drug-eluting platform of the circumflex cornoray artery with a 3.0 x 18 [employer] Promus drug-eluting platform post dilated to 3.75 with a noncompliant balloon.  . TEE WITHOUT CARDIOVERSION  06/20/2011   Procedure: TRANSESOPHAGEAL ECHOCARDIOGRAM (TEE);  Surgeon: Larey Dresser, MD;  Location: [facility];  Service: Cardiovascular;  Laterality: N/A;    Current Outpatient Rx  . Order #: [id number] Class: Historical Med  . Order #: [id number] Class: Normal  . Order #: [id number] Class: Normal  . Order #: [id number] Class: Normal  . Order #: [id number] Class: Normal  . Order #: [id number] Class: Historical Med  . Order #: [id number] Class: Normal  . Order #: [id number] Class: Normal  . Order #: [id number] Class: Normal  . Order #: [id number] Class: Normal  . Order #: [id number] Class: Normal  . Order #: [id number] Class: Historical Med  . Order #: [id number] Class: Historical Med  . Order #: [id number] Class: Normal  . Order #: [id number] Class: Print  . Order #: [id number] Class: Print  . Order #: [id number] Class: Normal  . Order #: [id number] Class: Normal  . Order #: [id number] Class: Normal  . Order #: [id number] Class: Print  . Order #: [id number] Class: Normal  . Order #: [id number] Class: Normal  . Order #: [id number] Class: Normal    Allergies Patient has no known allergies.  Family History  Problem Relation Age of Onset  . Lung cancer Father        deceased  . Stroke Mother        deceased-[place]ES    Social History Social History   Tobacco Use  . Smoking status: Never Smoker  . Smokeless tobacco: Never Used  Substance Use Topics  . Alcohol use: No    Alcohol/week: 0.0 oz    Comment: 1 beer a month  . Drug use: No    Review of Systems  All other systems negative except as documented in the HPI. All pertinent positives and negatives as reviewed in the  HPI. ____________________________________________   PHYSICAL EXAM:  VITAL SIGNS: ED Triage Vitals  Enc Vitals Group     BP 04/06/17 0902 (!) 154/79     Pulse Rate 04/06/17 0902 76     Resp 04/06/17 0902 19     Temp 04/06/17 0902 99.5 F (37.5 C)     Temp Source 04/06/17 0902 Oral     SpO2 04/06/17 0835 90 %     Weight 04/06/17 0902 262 lb (118.8 kg)     Height 04/06/17 0902 6' 3.5" (1.918 m)    Constitutional: Alert and oriented. Well appearing and in no acute distress. Eyes: Conjunctivae are normal. PERRL. EOMI. Head: Atraumatic. Nose: No congestion/rhinnorhea. Mouth/Throat: Mucous membranes are moist.  Oropharynx non-erythematous. Neck: No stridor.  No meningeal signs.   Cardiovascular: Normal rate, regular rhythm. Good peripheral circulation. Grossly normal heart sounds.   Respiratory: Normal respiratory effort.  No retractions. Lungs CTAB. Gastrointestinal: Soft and nontender. No distention.  Musculoskeletal: No lower extremity tenderness nor edema. No  gross deformities of extremities. Neurologic:  Normal speech and language. No gross focal neurologic deficits are appreciated. No altered mental status, able to give full seemingly accurate history.  Face is asymmetric with right sided droop (possibly related to swelling there), EOM's intact, pupils equal and reactive, vision intact, tongue and uvula midline without deviation. Upper and Lower extremity motor 5/5 on right 4/5 on left, intact pain perception in distal extremities,Able to perform finger to nose normal with both hands. Walks with assistance but without ataxia which is close to baseline..  Skin:  Skin is warm, dry and intact. No rash noted.  ____________________________________________   LABS (all labs ordered are listed, but only abnormal results are displayed)  Labs Reviewed  BASIC METABOLIC PANEL - Abnormal; Notable for the following components:      Result Value   Glucose, Bld 111 (*)    All other  components within normal limits  CK - Abnormal; Notable for the following components:   Total CK 729 (*)    All other components within normal limits  CBG MONITORING, ED - Abnormal; Notable for the following components:   Glucose-Capillary 102 (*)    All other components within normal limits  CBC  URINALYSIS, ROUTINE W REFLEX MICROSCOPIC   ____________________________________________  EKG   EKG Interpretation  Date/Time:  Monday April 06 2017 09:08:13 EST Ventricular Rate:  77 PR Interval:    QRS Duration: 105 QT Interval:  410 QTC Calculation: 464 R Axis:   -41 Text Interpretation:  Sinus rhythm Left anterior fascicular block Abnormal R-wave progression, early transition Left ventricular hypertrophy Borderline T abnormalities, anterior leads Confirmed by Merrily Pew [phone]) on 04/06/2017 9:32:58 AM       ____________________________________________  RADIOLOGY  Ct Angio Head W Or Wo Contrast  Result Date: 04/06/2017 CLINICAL DATA:  Altered mental status. Weakness. History of stroke with residual left-sided weakness. Facial swelling. EXAM: CT ANGIOGRAPHY HEAD AND NECK TECHNIQUE: Multidetector CT imaging of the head and neck was performed using the standard protocol during bolus administration of intravenous contrast. Multiplanar CT image reconstructions and MIPs were obtained to evaluate the vascular anatomy. Carotid stenosis measurements (when applicable) are obtained utilizing NASCET criteria, using the distal internal carotid diameter as the denominator. CONTRAST:  75 mL Isovue 370 COMPARISON:  Head MRI/MRA 01/14/2017. FINDINGS: CT HEAD FINDINGS Brain: There is no evidence of acute infarct, intracranial hemorrhage, mass, midline shift, or extra-axial fluid collection. A chronic infarct is again noted involving the right basal ganglia and insula, and there is associated ex vacuo enlargement of the right lateral ventricle. Patchy to confluent hypodensities in the cerebral white  matter bilaterally are nonspecific but compatible with advanced chronic small vessel ischemic disease. Vascular: Calcified atherosclerosis at the skull base. Skull: No acute fracture or suspicious osseous lesion. Sinuses: Scattered paranasal sinus mucosal thickening, more fully evaluated on separate maxillofacial CT. Clear mastoid air cells. Orbits: Bilateral cataract extraction. Review of the MIP images confirms the above findings CTA NECK FINDINGS Aortic arch: Standard 3 vessel aortic arch with mild atherosclerotic plaque. Widely patent arch vessel origins. Right carotid system: Patent without evidence of stenosis or dissection. Mild nonstenotic soft and calcified plaque about the carotid bifurcation. Left carotid system: Patent without evidence of stenosis or dissection. Mild nonstenotic calcified plaque in the mid common carotid artery and at the carotid bifurcation. Mildly tortuous proximal common carotid artery. Vertebral arteries: The vertebral arteries are patent and codominant. Calcified plaque at the right vertebral artery origin results in likely moderate stenosis. Calcified  plaque at the left vertebral artery origin and more distally in the left V1 segment results in up to moderate if not severe stenosis. Skeleton: Advanced C1-2 arthropathy. Moderate lower cervical disc degeneration and moderate cervical facet arthrosis. Other neck: Detailed assessment of the maxillofacial soft tissues deferred to the separate dedicated CT. No neck mass. Upper chest: Clear lung apices.  Prior CABG. Review of the MIP images confirms the above findings CTA HEAD FINDINGS Anterior circulation: The internal carotid arteries are patent from skull base to carotid termini with mild siphon atherosclerosis and slight ectasia. No significant siphon stenosis is present. ACAs and MCAs are patent without evidence of proximal branch occlusion or significant proximal stenosis. No aneurysm. Posterior circulation: The intracranial  vertebral arteries are patent to the basilar with mild irregularity but no significant stenosis. Right PICA is patent. Left PICA and AICA is are not identified. SCAs are grossly patent. The basilar artery is patent without stenosis. There is a fetal type origin of the left PCA. Both PCAs are patent with mild atherosclerotic irregularity but no flow limiting proximal stenosis. No aneurysm. Venous sinuses: Patent. Anatomic variants: Fetal origin of the left PCA. Delayed phase: No abnormal enhancement. Review of the MIP images confirms the above findings IMPRESSION: 1. No evidence of acute intracranial abnormality. Chronic ischemic changes as above. 2. No large vessel occlusion. Mild intracranial atherosclerosis without significant stenosis. 3. Moderate right vertebral artery origin stenosis. Moderate to severe proximal left vertebral artery stenoses. 4. Mild cervical carotid artery atherosclerosis without stenosis. 5.  Aortic Atherosclerosis (ICD10-I70.0). Electronically Signed   By: Logan Bores M.D.   On: 04/06/2017 11:17   Ct Angio Neck W And/or Wo Contrast  Result Date: 04/06/2017 CLINICAL DATA:  Altered mental status. Weakness. History of stroke with residual left-sided weakness. Facial swelling. EXAM: CT ANGIOGRAPHY HEAD AND NECK TECHNIQUE: Multidetector CT imaging of the head and neck was performed using the standard protocol during bolus administration of intravenous contrast. Multiplanar CT image reconstructions and MIPs were obtained to evaluate the vascular anatomy. Carotid stenosis measurements (when applicable) are obtained utilizing NASCET criteria, using the distal internal carotid diameter as the denominator. CONTRAST:  75 mL Isovue 370 COMPARISON:  Head MRI/MRA 01/14/2017. FINDINGS: CT HEAD FINDINGS Brain: There is no evidence of acute infarct, intracranial hemorrhage, mass, midline shift, or extra-axial fluid collection. A chronic infarct is again noted involving the right basal ganglia and  insula, and there is associated ex vacuo enlargement of the right lateral ventricle. Patchy to confluent hypodensities in the cerebral white matter bilaterally are nonspecific but compatible with advanced chronic small vessel ischemic disease. Vascular: Calcified atherosclerosis at the skull base. Skull: No acute fracture or suspicious osseous lesion. Sinuses: Scattered paranasal sinus mucosal thickening, more fully evaluated on separate maxillofacial CT. Clear mastoid air cells. Orbits: Bilateral cataract extraction. Review of the MIP images confirms the above findings CTA NECK FINDINGS Aortic arch: Standard 3 vessel aortic arch with mild atherosclerotic plaque. Widely patent arch vessel origins. Right carotid system: Patent without evidence of stenosis or dissection. Mild nonstenotic soft and calcified plaque about the carotid bifurcation. Left carotid system: Patent without evidence of stenosis or dissection. Mild nonstenotic calcified plaque in the mid common carotid artery and at the carotid bifurcation. Mildly tortuous proximal common carotid artery. Vertebral arteries: The vertebral arteries are patent and codominant. Calcified plaque at the right vertebral artery origin results in likely moderate stenosis. Calcified plaque at the left vertebral artery origin and more distally in the left V1 segment results  in up to moderate if not severe stenosis. Skeleton: Advanced C1-2 arthropathy. Moderate lower cervical disc degeneration and moderate cervical facet arthrosis. Other neck: Detailed assessment of the maxillofacial soft tissues deferred to the separate dedicated CT. No neck mass. Upper chest: Clear lung apices.  Prior CABG. Review of the MIP images confirms the above findings CTA HEAD FINDINGS Anterior circulation: The internal carotid arteries are patent from skull base to carotid termini with mild siphon atherosclerosis and slight ectasia. No significant siphon stenosis is present. ACAs and MCAs are patent  without evidence of proximal branch occlusion or significant proximal stenosis. No aneurysm. Posterior circulation: The intracranial vertebral arteries are patent to the basilar with mild irregularity but no significant stenosis. Right PICA is patent. Left PICA and AICA is are not identified. SCAs are grossly patent. The basilar artery is patent without stenosis. There is a fetal type origin of the left PCA. Both PCAs are patent with mild atherosclerotic irregularity but no flow limiting proximal stenosis. No aneurysm. Venous sinuses: Patent. Anatomic variants: Fetal origin of the left PCA. Delayed phase: No abnormal enhancement. Review of the MIP images confirms the above findings IMPRESSION: 1. No evidence of acute intracranial abnormality. Chronic ischemic changes as above. 2. No large vessel occlusion. Mild intracranial atherosclerosis without significant stenosis. 3. Moderate right vertebral artery origin stenosis. Moderate to severe proximal left vertebral artery stenoses. 4. Mild cervical carotid artery atherosclerosis without stenosis. 5.  Aortic Atherosclerosis (ICD10-I70.0). Electronically Signed   By: Logan Bores M.D.   On: 04/06/2017 11:17   Ct Maxillofacial W Contrast  Result Date: 04/06/2017 CLINICAL DATA:  Facial swelling. EXAM: CT MAXILLOFACIAL WITH CONTRAST TECHNIQUE: Multidetector CT imaging of the maxillofacial structures was performed with intravenous contrast. Multiplanar CT image reconstructions were also generated. CONTRAST:  75 mL Isovue 370 COMPARISON:  Limited coronal sinus CT 05/06/2006 FINDINGS: Osseous: No evidence of fracture, destructive osseous process, or mandibular dislocation. Advanced atlantodental arthropathy. Orbits: Bilateral cataract extraction. No postseptal inflammatory changes. No orbital mass. Sinuses: Mild-to-moderate right and mild left ethmoid air cell mucosal thickening. Mild bilateral maxillary and sphenoid sinus mucosal thickening. No fluid level. No significant  mastoid fluid. Clear tympanic cavities bilaterally. Soft tissues: Mild right facial soft tissue swelling including in the infraorbital and nasolabial fold regions with more diffuse subcutaneous fat reticulation/haziness extending laterally and inferiorly in the face without fluid collection. Carious right maxillary lateral incisor with associated periapical lucency and overlying focal phlegmonous change. No evidence of subperiosteal abscess. Limited intracranial: Evaluated on separate head CTA. IMPRESSION: Right-sided facial soft tissue swelling which may reflect dental infection arising from the right maxillary lateral incisor. Electronically Signed   By: Logan Bores M.D.   On: 04/06/2017 11:26    ____________________________________________   PROCEDURES  Procedure(s) performed:   Procedures   ____________________________________________   INITIAL IMPRESSION / ASSESSMENT AND PLAN / ED COURSE  Will evaluate for causes of weakness and lightheadedness.  He does have what appears to be right facial droop but this is probably related to swelling there that is obvious.  Will CT head and face to make sure is no abscess or other drainable issues but might need antibiotics.  We will also evaluate for urinary tract infection, dehydration and rhabdo.  Workup relatively unremarkable but does confirm that right sided facial deficits are likely related to swelling from a tooth infection so will start antibiotics for that, has pain meds at home and will fu w/ dentist. Low likelihood of stroke at this time. Will return if  new/worsening symptoms.    Pertinent labs & imaging results that were available during my care of the patient were reviewed by me and considered in my medical decision making (see chart for details).  ____________________________________________  FINAL CLINICAL IMPRESSION(S) / ED DIAGNOSES  Final diagnoses:  Lightheadedness     MEDICATIONS GIVEN DURING THIS  VISIT:  Medications  iopamidol (ISOVUE-370) 76 % injection (not administered)  sodium chloride 0.9 % bolus 1,000 mL (0 mLs Intravenous Stopped 04/06/17 1330)  traMADol (ULTRAM) tablet 150 mg (150 mg Oral Given 04/06/17 1512)  clindamycin (CLEOCIN) capsule 300 mg (300 mg Oral Given 04/06/17 1511)     NEW OUTPATIENT MEDICATIONS STARTED DURING THIS VISIT:  New Prescriptions   CLINDAMYCIN (CLEOCIN) 300 MG CAPSULE    Take 1 capsule (300 mg total) by mouth 4 (four) times daily. X 7 days    Note:  This note was prepared with assistance of Dragon voice recognition software. Occasional wrong-word or sound-a-like substitutions may have occurred due to the inherent limitations of voice recognition software.   Merrily Pew, MD 04/06/17 1719

## 2017-04-06 NOTE — ED Notes (Signed)
Mesner, MD informed of CK results

## 2017-04-06 NOTE — ED Notes (Signed)
Attempted to discharge patient. Patient states he has to call his wife because she has all of his clothes. Patient states he will not leave until his wife returns. Wife called.

## 2017-04-06 NOTE — ED Notes (Signed)
PT daughter requesting an update on pt, phone number in chart

## 2017-04-06 NOTE — ED Notes (Signed)
Pt.ambulated with one assit  In hallway and back to bed   he needed to go to the bathroom.

## 2017-04-07 ENCOUNTER — Telehealth: Payer: Self-pay | Admitting: Medical

## 2017-04-07 ENCOUNTER — Telehealth: Payer: Self-pay | Admitting: Neurology

## 2017-04-07 NOTE — Telephone Encounter (Signed)
Patient's daughter sent me a my chart message regarding his recent workup in the emergency department and also as to why his CK level was elevated.  The CK level was I believe in the 700s.  This type of question would be better be addressed with an office visit.  I have only seen him one time and can only speculate why his CK was elevated.  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  From reviewing the note I do not think it was anything cardiac related.  I think he needs another CK repeated to see if it was just transiently elevated and it might be close to normal now..  He also might need some other specialized testing.  So would you call patient's daughter and see if she would be willing to bring him in for follow-up with PCP or I would be willing to see them if Lenna Sciara is too busy.

## 2017-04-07 NOTE — Telephone Encounter (Signed)
LVM for pt to call and schedule 6 month f/u per Dr. Leonie Man with [facility]. Needs to be around 6/20.

## 2017-04-07 NOTE — Telephone Encounter (Signed)
Pt's daughter not listed on HIPPA. Called home number and spoke with pt. Advised him of below. Pt currently has appt with Melissa on 04/20/17. Pt believes elevated result is coming from current dental infection with a couple of his teeth that has caused the left side of his face to swell as well. Pt has dental appts coming up soon.  Please advise if f/u with [place] should be earlier or is it ok to wait until 2/4.  Advised pt if we do not call him back he should keep appt on 04/20/17 and if earlier appt is needed we will call him tomorrow.  Please advise?

## 2017-04-08 NOTE — Telephone Encounter (Signed)
Notified pt and scheduled appt for tomorrow at 10:20am with PCP.

## 2017-04-08 NOTE — Telephone Encounter (Signed)
I think patient should be seen this week please.

## 2017-04-09 ENCOUNTER — Encounter: Payer: Self-pay | Admitting: Family

## 2017-04-09 ENCOUNTER — Ambulatory Visit (INDEPENDENT_AMBULATORY_CARE_PROVIDER_SITE_OTHER): Payer: Medicare Other | Admitting: Family

## 2017-04-09 ENCOUNTER — Telehealth: Payer: Self-pay | Admitting: Family

## 2017-04-09 VITALS — BP 159/85 | HR 85 | Temp 98.7°F | Resp 16 | Ht 75.0 in | Wt 267.8 lb

## 2017-04-09 DIAGNOSIS — R42 Dizziness and giddiness: Secondary | ICD-10-CM | POA: Diagnosis not present

## 2017-04-09 DIAGNOSIS — R748 Abnormal levels of other serum enzymes: Secondary | ICD-10-CM

## 2017-04-09 DIAGNOSIS — K047 Periapical abscess without sinus: Secondary | ICD-10-CM | POA: Diagnosis not present

## 2017-04-09 DIAGNOSIS — Z8673 Personal history of transient ischemic attack (TIA), and cerebral infarction without residual deficits: Secondary | ICD-10-CM

## 2017-04-09 LAB — COMPREHENSIVE METABOLIC PANEL
ALBUMIN: 3.8 g/dL (ref 3.5–5.2)
ALK PHOS: 52 U/L (ref 39–117)
ALT: 29 U/L (ref 0–53)
AST: 33 U/L (ref 0–37)
BUN: 14 mg/dL (ref 6–23)
CO2: 30 mEq/L (ref 19–32)
CREATININE: 1.02 mg/dL (ref 0.40–1.50)
Calcium: 8.7 mg/dL (ref 8.4–10.5)
Chloride: 104 mEq/L (ref 96–112)
GFR: 75.9 mL/min (ref >60.00)
Glucose, Bld: 125 mg/dL — ABNORMAL HIGH (ref 70–99)
POTASSIUM: 4 meq/L (ref 3.5–5.1)
SODIUM: 140 meq/L (ref 135–145)
Total Bilirubin: 0.8 mg/dL (ref 0.2–1.2)
Total Protein: 6.9 g/dL (ref 6.0–8.3)

## 2017-04-09 NOTE — Patient Instructions (Addendum)
Please schedule follow up with your dentist.  Please complete lab work prior to leaving.

## 2017-04-09 NOTE — Addendum Note (Signed)
Addended by: Harl Bowie on: 04/09/2017 12:05 PM   Modules accepted: Orders

## 2017-04-09 NOTE — Telephone Encounter (Signed)
Dr. Leonie Man- I saw pt today for ER follow up of dizziness. Had CTA which revealed moderate right vertebral artery origin stenosis. Moderate to severe proximal left vertebral artery stenoses. I wanted to bring this to your attention in case this finding would change your management or in case you want to see him back sooner. Thank you.

## 2017-04-09 NOTE — Progress Notes (Signed)
Subjective:    Patient ID: [name], male    DOB: Jun 17, 1945, 72 y.o.   MRN: [id number]  HPI  Patient is a 72 year old male who presents today for emergency department follow-up.  ER record is reviewed in epic.  He was evaluated on 04/06/2017 in the emergency department with chief complaint of dizziness.  He apparently had sat up in bed that day and felt dizzy and lightheaded.  He subsequently slid out of the bed and onto the ground.  He did not have any injury.  He denied syncope.  He also reported that he was having a dental infection that he planned to see his dentist for.  He underwent a CT of the head and face.  This noted right-sided facial soft tissue swelling which was felt to reflect dental infection arising from the right maxillary lateral incisor.  CT angios the head and neck noted no acute intracranial abnormality.  Chronic ischemic changes were noted.  He was noted to have moderate right vertebral artery stenosis and moderate to severe proximal left vertebral artery stenosis.  He was started on antibiotics for dental infection.  His total CK level was elevated at 729.  CBC was unremarkable.  Kidney function and electrolytes were normal.   He reports that his dental pain/swelling and numbness are improved on the clindamycin.  He has not scheduled follow up with his dentist yet.  He is trying to get dental insurance. He reports that he is "just tired, I don't sleep well at night." Insomnia is chronic and unchanged.  No dizziness/falls since returning home.   Review of Systems    see HPI  Past Medical History:  Diagnosis Date  . CAD (coronary artery disease)    s/p CABG, s/p DES to LCX  January 2011  . Chronic pain    left sided-[place]  . CVA (cerebral infarction)    left hemiparesis  . Diastolic CHF ([place])   . Dysrhythmia    hx of atrial fibrilation with cardioversion  . GERD (gastroesophageal reflux disease)   . Gout   . Hyperlipidemia   . Hypertension   .  Hypothyroidism   . OSA on CPAP   . Sleep apnea   . Stroke ([place]) 1996  . Tubular adenoma of colon 05/2010     Social History   Socioeconomic History  . Marital status: Married    Spouse name: [name]  . Number of children: Not on file  . Years of education: 66  . Highest education level: Not on file  Social Needs  . Financial resource strain: Not on file  . Food insecurity - worry: Not on file  . Food insecurity - inability: Not on file  . Transportation needs - medical: Not on file  . Transportation needs - non-medical: Not on file  Occupational History  . 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 Smoking status: Never Smoker  . Smokeless tobacco: Never Used  Substance and Sexual Activity  . Alcohol use: No    Alcohol/week: 0.0 oz    Comment: 1 beer a month  . Drug use: No  . Sexual activity: No  Other Topics Concern  . Not on file  Social History Narrative   Pt lives with wife. Does have stairs, but patient doesn't use them. Pt has completed technical school    Past Surgical History:  Procedure Laterality Date  . CARDIAC CATHETERIZATION N/A 10/25/2015   Procedure:  Left Heart Cath and Cors/Grafts Angiography;  Surgeon: Troy Sine, MD;  Location: [facility];  Service: Cardiovascular;  Laterality: N/A;  . CARDIOVERSION  06/20/2011   Procedure: CARDIOVERSION;  Surgeon: Larey Dresser, MD;  Location: [facility] ENDOSCOPY;  Service: Cardiovascular;  Laterality: N/A;  . CARDIOVERSION N/A 04/26/2015   Procedure: CARDIOVERSION;  Surgeon: Dorothy Spark, MD;  Location: [facility] ENDOSCOPY;  Service: Cardiovascular;  Laterality: N/A;  . CARDIOVERSION N/A 05/10/2015   Procedure: CARDIOVERSION;  Surgeon: Larey Dresser, MD;  Location: [place];  Service: Cardiovascular;  Laterality: N/A;  . CATARACT EXTRACTION  05/2010   left eye  . CATARACT EXTRACTION  04/2010   right eye  . CORONARY ARTERY BYPASS GRAFT     stent  .  ESOPHAGOGASTRODUODENOSCOPY  03-25-2005  . NASAL SEPTUM SURGERY    . PERCUTANEOUS PLACEMENT INTRAVASCULAR STENT CERVICAL CAROTID ARTERY     03-2009; using a drug-eluting platform of the circumflex cornoray artery with a 3.0 x 18 [employer] Promus drug-eluting platform post dilated to 3.75 with a noncompliant balloon.  . TEE WITHOUT CARDIOVERSION  06/20/2011   Procedure: TRANSESOPHAGEAL ECHOCARDIOGRAM (TEE);  Surgeon: Larey Dresser, MD;  Location: [facility] ENDOSCOPY;  Service: Cardiovascular;  Laterality: N/A;    Family History  Problem Relation Age of Onset  . Lung cancer Father        deceased  . Stroke Mother        deceased-MINISTROKES    No Known Allergies  Current Outpatient Medications on File Prior to Visit  Medication Sig Dispense Refill  . albuterol (PROVENTIL HFA;VENTOLIN HFA) 108 (90 Base) MCG/ACT inhaler Inhale 1-2 puffs into the lungs every 6 (six) hours as needed for wheezing or shortness of breath.     Marland Kitchen albuterol (PROVENTIL HFA;VENTOLIN HFA) 108 (90 Base) MCG/ACT inhaler Inhale 2 puffs into the lungs every 6 (six) hours as needed for wheezing or shortness of breath. 1 Inhaler 0  . allopurinol (ZYLOPRIM) 100 MG tablet TAKE 2 TABLETS EVERY DAY (Patient taking differently: Take 200 mg by mouth in the morning) 180 tablet 1  . atorvastatin (LIPITOR) 40 MG tablet TAKE 2 TABLETS (80 MG TOTAL) BY MOUTH DAILY. (Patient taking differently: Take 80 mg by mouth at bedtime. ) 180 tablet 3  . benazepril (LOTENSIN) 10 MG tablet Take 1 tablet (10 mg total) by mouth daily. (Patient taking differently: Take 10 mg by mouth every morning. Takes with 5 mg to equal 15 mg) 135 tablet 1  . benazepril (LOTENSIN) 5 MG tablet Take 5 mg by mouth daily. Pt takes with 10 mg to equal 15 mg    . benzonatate (TESSALON) 200 MG capsule Take 1 capsule (200 mg total) by mouth 3 (three) times daily as needed for cough. 30 capsule 0  . clindamycin (CLEOCIN) 300 MG capsule Take 1 capsule (300 mg total) by mouth 4  (four) times daily. X 7 days 28 capsule 0  . doxycycline (VIBRA-TABS) 100 MG tablet Take 1 tablet (100 mg total) by mouth 2 (two) times daily. Can give caps or generic 20 tablet 0  . fluticasone (FLONASE) 50 MCG/ACT nasal spray Place 2 sprays into both nostrils daily. (Patient taking differently: Place 2 sprays into both nostrils daily as needed for allergies. ) 9.9 g 1  . furosemide (LASIX) 40 MG tablet Take 1 tablet (40 mg total) by mouth every other day. 45 tablet 1  . isosorbide mononitrate (IMDUR) 30 MG 24 hr tablet Take 0.5 tablets (15 mg total) by  mouth daily. (Patient taking differently: Take 15 mg by mouth every morning. ) 30 tablet 6  . lactulose (CHRONULAC) 10 GM/15ML solution Take 15 mLs (10 g total) by mouth daily as needed for mild constipation. 240 mL 1  . levothyroxine (SYNTHROID, LEVOTHROID) 112 MCG tablet Take 1 tablet (112 mcg total) by mouth daily before breakfast. 90 tablet 0  . metFORMIN (GLUCOPHAGE) 500 MG tablet Take 1 tablet (500 mg total) daily with breakfast by mouth. 90 tablet 1  . Multiple Vitamins-Minerals (MULTIVITAMIN GUMMIES MENS PO) Take 1 tablet by mouth daily with breakfast.     . nitroGLYCERIN (NITROSTAT) 0.4 MG SL tablet Place 1 tablet (0.4 mg total) under the tongue every 5 (five) minutes as needed for chest pain. Up to 3 doses 10 tablet 2  . OXYGEN Inhale 2 L into the lungs at bedtime.    . pantoprazole (PROTONIX) 40 MG tablet Take 1 tablet (40 mg total) every morning by mouth. 90 tablet 1  . traMADol (ULTRAM) 50 MG tablet Take 1 tablet (50 mg total) by mouth 2 (two) times daily. 180 tablet 0  . traMADol (ULTRAM-ER) 200 MG 24 hr tablet Take 1 tablet (200 mg total) by mouth daily. 90 tablet 0  . vitamin B-12 1000 MCG tablet Take 1 tablet (1,000 mcg total) by mouth daily. 30 tablet 0  . warfarin (COUMADIN) 2.5 MG tablet Take as directed by coumadin clinic (Patient taking differently: Take 1.25-2.5 mg by mouth See admin instructions. 2.5 MG Monday, Wednesday &  Friday. All other days- 1.25 mg ( 0.5 of 2.5 mg tablet )) 90 tablet 0   No current facility-administered medications on file prior to visit.     BP (!) 159/85 (BP Location: Right Arm, Patient Position: Sitting, Cuff Size: Large)   Pulse 85   Temp 98.7 F (37.1 C) (Oral)   Resp 16   Ht 6\' 3"  (1.905 m)   Wt 267 lb 12.8 oz (121.5 kg)   SpO2 96%   BMI 33.47 kg/m    Objective:   Physical Exam  Constitutional: He is oriented to person, place, and time. He appears well-developed and well-nourished. No distress.  HENT:  Head: Normocephalic and atraumatic.  Poor dentition noted  Cardiovascular: Normal rate and regular rhythm.  No murmur heard. Pulmonary/Chest: Effort normal and breath sounds normal. No respiratory distress. He has no wheezes. He has no rales.  Musculoskeletal: He exhibits no edema.  Neurological: He is alert and oriented to person, place, and time.  LUE/LLE weakness at baseline  Skin: Skin is warm and dry.  Psychiatric: He has a normal mood and affect. His behavior is normal. Thought content normal.          Assessment & Plan:  Dental abscess- clinically improving. Will arrange follow up with his oral surgeon. Continue abx.   Dizziness- resolved.  Elevated CK level- repeat CK and bmet.  Hx of CVA- reviewed CT angio- notes moderate right vertebral artery origin stenosis. Moderate to severe proximal left vertebral artery stenoses.Will ask his neurologist to take a look at his imaging.

## 2017-04-11 NOTE — Telephone Encounter (Signed)
Thanks. I agree I need to see him sooner and decide on treatment change if needed

## 2017-04-15 ENCOUNTER — Telehealth: Payer: Self-pay | Admitting: Family

## 2017-04-15 DIAGNOSIS — R748 Abnormal levels of other serum enzymes: Secondary | ICD-10-CM

## 2017-04-15 LAB — CK ISOENZYMES
CK-BB: 0 %
CK-MB: 0 % (ref <5)
CK-MM: 100 % (ref 95–100)
Total CK: 484 U/L — ABNORMAL HIGH (ref 44–196)

## 2017-04-15 NOTE — Telephone Encounter (Signed)
Muscle enzyme is improving but still elevated. I would recommend that he hold atorvastatin, repeat CK as well as bmet in 1 week, dx is elevated CK level.

## 2017-04-17 NOTE — Telephone Encounter (Signed)
Attempted to reach pt and left message to check mychart acct. Message sent.  Pt labs were done 04/09/17. He has follow up with PCP on 04/20/17 so pt will repeat labs at that visit.

## 2017-04-19 NOTE — Telephone Encounter (Signed)
Opened in error

## 2017-04-20 ENCOUNTER — Other Ambulatory Visit: Payer: Medicare Other

## 2017-04-20 ENCOUNTER — Ambulatory Visit: Payer: Medicare Other | Admitting: Family

## 2017-04-20 NOTE — Addendum Note (Signed)
Addended by: Kelle Darting A on: 04/20/2017 09:27 AM   Modules accepted: Orders

## 2017-04-20 NOTE — Telephone Encounter (Signed)
Patient came in today for office visit, he was advised of the results and to dc Atorvastatin. We will bring patient back next week for repeat labs. He will keep f/up appointment to see pcp in April

## 2017-04-21 ENCOUNTER — Other Ambulatory Visit: Payer: Self-pay | Admitting: Physical Medicine & Rehabilitation

## 2017-04-21 NOTE — Telephone Encounter (Signed)
Recieved electronic medication refill request for tramadol, no mention in last notes since 09-02-2016 as to use this medications, is it ok to refill, please advise

## 2017-04-22 ENCOUNTER — Other Ambulatory Visit: Payer: Self-pay | Admitting: Physical Medicine & Rehabilitation

## 2017-04-23 ENCOUNTER — Telehealth: Payer: Self-pay | Admitting: Adult Health

## 2017-04-23 MED ORDER — TRAMADOL HCL 50 MG PO TABS: 50.0000 mg | ORAL_TABLET | Freq: Two times a day (BID) | ORAL | 0 refills | Status: DC

## 2017-04-23 MED ORDER — TRAMADOL HCL ER 200 MG PO TB24: 200.0000 mg | ORAL_TABLET | Freq: Every day | ORAL | 0 refills | Status: DC

## 2017-04-23 NOTE — Telephone Encounter (Signed)
LVM for pt to schedule a 6 month f/u that Dr. Leonie Man has requested w/NP JV. Needs to be for 09/03/17. Thank you.

## 2017-04-27 ENCOUNTER — Telehealth: Payer: Self-pay | Admitting: *Deleted

## 2017-04-27 ENCOUNTER — Ambulatory Visit (HOSPITAL_BASED_OUTPATIENT_CLINIC_OR_DEPARTMENT_OTHER)
Admission: RE | Admit: 2017-04-27 | Discharge: 2017-04-27 | Disposition: A | Discharge status: Home / Self Care | Payer: Medicare Other | Source: Ambulatory Visit | Attending: Family Medicine | Admitting: Family Medicine

## 2017-04-27 ENCOUNTER — Other Ambulatory Visit (INDEPENDENT_AMBULATORY_CARE_PROVIDER_SITE_OTHER): Payer: Medicare Other

## 2017-04-27 ENCOUNTER — Encounter: Payer: Self-pay | Admitting: Family Medicine

## 2017-04-27 ENCOUNTER — Ambulatory Visit (INDEPENDENT_AMBULATORY_CARE_PROVIDER_SITE_OTHER): Payer: Medicare Other | Admitting: Family Medicine

## 2017-04-27 VITALS — BP 141/64 | HR 77 | Temp 98.0°F | Resp 16

## 2017-04-27 DIAGNOSIS — R0602 Shortness of breath: Secondary | ICD-10-CM

## 2017-04-27 DIAGNOSIS — Z951 Presence of aortocoronary bypass graft: Secondary | ICD-10-CM | POA: Insufficient documentation

## 2017-04-27 DIAGNOSIS — G4733 Obstructive sleep apnea (adult) (pediatric): Secondary | ICD-10-CM

## 2017-04-27 DIAGNOSIS — I700 Atherosclerosis of aorta: Secondary | ICD-10-CM | POA: Insufficient documentation

## 2017-04-27 DIAGNOSIS — R748 Abnormal levels of other serum enzymes: Secondary | ICD-10-CM | POA: Diagnosis not present

## 2017-04-27 DIAGNOSIS — R531 Weakness: Secondary | ICD-10-CM

## 2017-04-27 HISTORY — DX: Atherosclerosis of aorta: I70.0

## 2017-04-27 LAB — POC URINALSYSI DIPSTICK (AUTOMATED)
Bilirubin, UA: NEGATIVE
Blood, UA: NEGATIVE
Glucose, UA: NEGATIVE
KETONES UA: NEGATIVE
LEUKOCYTES UA: NEGATIVE
Nitrite, UA: NEGATIVE
Protein, UA: NEGATIVE
SPEC GRAV UA: 1.025 (ref 1.010–1.025)
UROBILINOGEN UA: 0.2 U/dL
pH, UA: 6 (ref 5.0–8.0)

## 2017-04-27 LAB — CBC WITH DIFFERENTIAL/PLATELET
BASOS ABS: 101 {cells}/uL (ref 0–200)
BASOS PCT: 2.3 %
EOS PCT: 5.7 %
Eosinophils Absolute: 251 cells/uL (ref 15–500)
HEMATOCRIT: 40.1 % (ref 38.5–50.0)
HEMOGLOBIN: 13.6 g/dL (ref 13.2–17.1)
LYMPHS ABS: 1606 {cells}/uL (ref 850–3900)
MCH: 29.2 pg (ref 27.0–33.0)
MCHC: 33.9 g/dL (ref 32.0–36.0)
MCV: 86.2 fL (ref 80.0–100.0)
MONOS PCT: 8.4 %
MPV: 11.3 fL (ref 7.5–12.5)
NEUTROS ABS: 2072 {cells}/uL (ref 1500–7800)
Neutrophils Relative %: 47.1 %
Platelets: 186 10*3/uL (ref 140–400)
RBC: 4.65 10*6/uL (ref 4.20–5.80)
RDW: 13 % (ref 11.0–15.0)
Total Lymphocyte: 36.5 %
WBC mixed population: 370 cells/uL (ref 200–950)
WBC: 4.4 10*3/uL (ref 3.8–10.8)

## 2017-04-27 LAB — COMPREHENSIVE METABOLIC PANEL
ALBUMIN: 3.8 g/dL (ref 3.5–5.2)
ALT: 36 U/L (ref 0–53)
AST: 27 U/L (ref 0–37)
Alkaline Phosphatase: 55 U/L (ref 39–117)
BUN: 18 mg/dL (ref 6–23)
CO2: 32 mEq/L (ref 19–32)
Calcium: 8.8 mg/dL (ref 8.4–10.5)
Chloride: 102 mEq/L (ref 96–112)
Creatinine, Ser: 1.08 mg/dL (ref 0.40–1.50)
GFR: 71.05 mL/min (ref >60.00)
Glucose, Bld: 122 mg/dL — ABNORMAL HIGH (ref 70–99)
POTASSIUM: 4.3 meq/L (ref 3.5–5.1)
Sodium: 141 mEq/L (ref 135–145)
Total Bilirubin: 0.6 mg/dL (ref 0.2–1.2)
Total Protein: 7 g/dL (ref 6.0–8.3)

## 2017-04-27 LAB — POCT INFLUENZA A/B
INFLUENZA A, POC: NEGATIVE
INFLUENZA B, POC: NEGATIVE

## 2017-04-27 LAB — D-DIMER, QUANTITATIVE: D-Dimer, Quant: 0.42 mcg/mL FEU (ref <0.50)

## 2017-04-27 LAB — CK: Total CK: 228 U/L (ref 7–232)

## 2017-04-27 LAB — TROPONIN I: TROPONIN I: 0.02 ng/mL (ref <=0.0)

## 2017-04-27 NOTE — Telephone Encounter (Signed)
Pt came in to the office for labs. States he is having shortness of breath all the time. Feels lightheaded every morning despite regular use of oxygen. Pt states he just doesn't feel good. Per verbal from PCP, pt needs to be seen today. Pt states he would have difficulty coming back this afternoon. PCP has no availability this morning. Placed pt in 11:15 opening on Dr Etter Sjogren Chase's schedule today.

## 2017-04-27 NOTE — Assessment & Plan Note (Signed)
Pt refuses cpap

## 2017-04-27 NOTE — Telephone Encounter (Signed)
Results reviewed. Pt is on coumadin and I have reviewed results with Dr. Etter Sjogren as well.

## 2017-04-27 NOTE — Telephone Encounter (Signed)
See additional phone note in Epic from today.

## 2017-04-27 NOTE — Assessment & Plan Note (Addendum)
Check labs and cxr  ekg-- no change from previous  If symptoms worsen go to ER Pt states O2 helps at night---  Really would benefit form cpap but pt refuses  Encouraged pt to f/u cardilogy as well

## 2017-04-27 NOTE — Progress Notes (Signed)
Patient ID: [name], male   DOB: 06/08/1945, 71 y.o.   MRN: [id number]    Subjective:  I acted as a Education administrator for Dr. Carollee Herter.  Guerry Bruin, [place]   Patient ID: [name], male    DOB: 07-25-1945, 72 y.o.   MRN: [id number]  Chief Complaint  Patient presents with  . Shortness of Breath    HPI  Patient is in today for shortness of breath, light headedness.  He states he just not feel well.  He came in this morning for blood work and stated these problems and was told to be seen.   Pt was seen in er about 1 month ago for same symptoms--- they are no better.  He feels like he has been punched.  No fevers, no congestion, no chest pain.  Just easily sob.   He admits to being told he needs cpap but  Refuses to use it.    Patient Care Team: Debbrah Alar, NP as PCP - General (Internal Medicine) Letta Pate Luanna Salk, MD as Consulting Physician (Physical Medicine and Rehabilitation) Penni Bombard, MD as Consulting Physician (Neurology) Larey Dresser, MD as Consulting Physician (Cardiology)   Past Medical History:  Diagnosis Date  . CAD (coronary artery disease)    s/p CABG, s/p DES to LCX  January 2011  . Chronic pain    left sided-[place]  . CVA (cerebral infarction)    left hemiparesis  . Diastolic CHF ([place])   . Dysrhythmia    hx of atrial fibrilation with cardioversion  . GERD (gastroesophageal reflux disease)   . Gout   . Hyperlipidemia   . Hypertension   . Hypothyroidism   . OSA on CPAP   . Sleep apnea   . Stroke ([place]) 1996  . Tubular adenoma of colon 05/2010    Past Surgical History:  Procedure Laterality Date  . CARDIAC CATHETERIZATION N/A 10/25/2015   Procedure: Left Heart Cath and Cors/Grafts Angiography;  Surgeon: Troy Sine, MD;  Location: [place] [facility];  Service: Cardiovascular;  Laterality: N/A;  . CARDIOVERSION  06/20/2011   Procedure: CARDIOVERSION;  Surgeon: Larey Dresser, MD;  Location: [facility] ENDOSCOPY;  Service: Cardiovascular;   Laterality: N/A;  . CARDIOVERSION N/A 04/26/2015   Procedure: CARDIOVERSION;  Surgeon: Dorothy Spark, MD;  Location: [facility] ENDOSCOPY;  Service: Cardiovascular;  Laterality: N/A;  . CARDIOVERSION N/A 05/10/2015   Procedure: CARDIOVERSION;  Surgeon: Larey Dresser, MD;  Location: [place];  Service: Cardiovascular;  Laterality: N/A;  . CATARACT EXTRACTION  05/2010   left eye  . CATARACT EXTRACTION  04/2010   right eye  . CORONARY ARTERY BYPASS GRAFT     stent  . ESOPHAGOGASTRODUODENOSCOPY  03-25-2005  . NASAL SEPTUM SURGERY    . PERCUTANEOUS PLACEMENT INTRAVASCULAR STENT CERVICAL CAROTID ARTERY     03-2009; using a drug-eluting platform of the circumflex cornoray artery with a 3.0 x 18 [employer] Promus drug-eluting platform post dilated to 3.75 with a noncompliant balloon.  . TEE WITHOUT CARDIOVERSION  06/20/2011   Procedure: TRANSESOPHAGEAL ECHOCARDIOGRAM (TEE);  Surgeon: Larey Dresser, MD;  Location: [facility]COPY;  Service: Cardiovascular;  Laterality: N/A;    Family History  Problem Relation Age of Onset  . Lung cancer Father        deceased  . Stroke Mother        deceased-MINISTROKES    Social History   Socioeconomic History  . Marital status: Married    Spouse name: [name]  .  Number of children: Not on file  . Years of education: 33  . Highest education level: Not on file  Social Needs  . Financial resource strain: Not on file  . Food insecurity - worry: Not on file  . Food insecurity - inability: Not on file  . Transportation needs - medical: Not on file  . Transportation needs - non-medical: Not on file  Occupational History  . 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 Smoking status: Never Smoker  . Smokeless tobacco: Never Used  Substance and Sexual Activity  . Alcohol use: No    Alcohol/week: 0.0 oz    Comment: 1 beer a month  . Drug use: No  . Sexual activity: No  Other Topics Concern  . Not on file    Social History Narrative   Pt lives with wife. Does have stairs, but patient doesn't use them. Pt has completed technical school    Outpatient Medications Prior to Visit  Medication Sig Dispense Refill  . albuterol (PROVENTIL HFA;VENTOLIN HFA) 108 (90 Base) MCG/ACT inhaler Inhale 1-2 puffs into the lungs every 6 (six) hours as needed for wheezing or shortness of breath.     Marland Kitchen albuterol (PROVENTIL HFA;VENTOLIN HFA) 108 (90 Base) MCG/ACT inhaler Inhale 2 puffs into the lungs every 6 (six) hours as needed for wheezing or shortness of breath. 1 Inhaler 0  . allopurinol (ZYLOPRIM) 100 MG tablet TAKE 2 TABLETS EVERY DAY (Patient taking differently: Take 200 mg by mouth in the morning) 180 tablet 1  . atorvastatin (LIPITOR) 40 MG tablet TAKE 2 TABLETS (80 MG TOTAL) BY MOUTH DAILY. (Patient taking differently: Take 80 mg by mouth at bedtime. ) 180 tablet 3  . benazepril (LOTENSIN) 10 MG tablet Take 1 tablet (10 mg total) by mouth daily. (Patient taking differently: Take 10 mg by mouth every morning. Takes with 5 mg to equal 15 mg) 135 tablet 1  . benazepril (LOTENSIN) 5 MG tablet Take 5 mg by mouth daily. Pt takes with 10 mg to equal 15 mg    . fluticasone (FLONASE) 50 MCG/ACT nasal spray Place 2 sprays into both nostrils daily. (Patient taking differently: Place 2 sprays into both nostrils daily as needed for allergies. ) 9.9 g 1  . furosemide (LASIX) 40 MG tablet Take 1 tablet (40 mg total) by mouth every other day. 45 tablet 1  . isosorbide mononitrate (IMDUR) 30 MG 24 hr tablet Take 0.5 tablets (15 mg total) by mouth daily. (Patient taking differently: Take 15 mg by mouth every morning. ) 30 tablet 6  . lactulose (CHRONULAC) 10 GM/15ML solution Take 15 mLs (10 g total) by mouth daily as needed for mild constipation. 240 mL 1  . levothyroxine (SYNTHROID, LEVOTHROID) 112 MCG tablet Take 1 tablet (112 mcg total) by mouth daily before breakfast. 90 tablet 0  . metFORMIN (GLUCOPHAGE) 500 MG tablet Take 1  tablet (500 mg total) daily with breakfast by mouth. 90 tablet 1  . Multiple Vitamins-Minerals (MULTIVITAMIN GUMMIES MENS PO) Take 1 tablet by mouth daily with breakfast.     . nitroGLYCERIN (NITROSTAT) 0.4 MG SL tablet Place 1 tablet (0.4 mg total) under the tongue every 5 (five) minutes as needed for chest pain. Up to 3 doses 10 tablet 2  . OXYGEN Inhale 2 L into the lungs at bedtime.    . pantoprazole (PROTONIX) 40 MG tablet Take 1 tablet (40 mg total) every morning by mouth. [place]  tablet 1  . traMADol (ULTRAM) 50 MG tablet Take 1 tablet (50 mg total) by mouth 2 (two) times daily. 180 tablet 0  . traMADol (ULTRAM-ER) 200 MG 24 hr tablet Take 1 tablet (200 mg total) by mouth daily. 90 tablet 0  . vitamin B-12 1000 MCG tablet Take 1 tablet (1,000 mcg total) by mouth daily. 30 tablet 0  . warfarin (COUMADIN) 2.5 MG tablet Take as directed by coumadin clinic (Patient taking differently: Take 1.25-2.5 mg by mouth See admin instructions. 2.5 MG Monday, Wednesday & Friday. All other days- 1.25 mg ( 0.5 of 2.5 mg tablet )) 90 tablet 0  . benzonatate (TESSALON) 200 MG capsule Take 1 capsule (200 mg total) by mouth 3 (three) times daily as needed for cough. (Patient not taking: Reported on 04/27/2017) 30 capsule 0  . clindamycin (CLEOCIN) 300 MG capsule Take 1 capsule (300 mg total) by mouth 4 (four) times daily. X 7 days (Patient not taking: Reported on 04/27/2017) 28 capsule 0  . doxycycline (VIBRA-TABS) 100 MG tablet Take 1 tablet (100 mg total) by mouth 2 (two) times daily. Can give caps or generic (Patient not taking: Reported on 04/27/2017) 20 tablet 0   No facility-administered medications prior to visit.     No Known Allergies  Review of Systems  Constitutional: Negative for fever and malaise/fatigue.  HENT: Negative.  Negative for congestion, ear discharge, ear pain, hearing loss, nosebleeds, sinus pain, sore throat and tinnitus.   Eyes: Negative for blurred vision.  Respiratory: Positive for  shortness of breath. Negative for cough, hemoptysis, sputum production, wheezing and stridor.   Cardiovascular: Negative for chest pain, palpitations and leg swelling.  Gastrointestinal: Negative for vomiting.  Genitourinary: Negative.   Musculoskeletal: Negative for back pain, falls, myalgias and neck pain.  Skin: Negative for rash.  Neurological: Positive for focal weakness. Negative for dizziness, tingling, tremors, loss of consciousness and headaches.       Light headed.       Objective:    Physical Exam  Constitutional: He is oriented to person, place, and time. Vital signs are normal. He appears well-developed and well-nourished. He is sleeping.  HENT:  Head: Normocephalic and atraumatic.  Mouth/Throat: Oropharynx is clear and moist.  Eyes: EOM are normal. Pupils are equal, round, and reactive to light.  Neck: Normal range of motion. Neck supple. No thyromegaly present.  Cardiovascular: Normal rate and regular rhythm.  No murmur heard. Pulmonary/Chest: Effort normal and breath sounds normal. No respiratory distress. He has no wheezes. He has no rales. He exhibits no tenderness.  Musculoskeletal: He exhibits no edema or tenderness.  Neurological: He is alert and oriented to person, place, and time.  L sided weakness-- residual from cva                   Not new   Skin: Skin is warm and dry.  Psychiatric: He has a normal mood and affect. His behavior is normal. Judgment and thought content normal.  Nursing note and vitals reviewed.   BP (!) 141/64 (BP Location: Left Arm)   Pulse 77   Temp 98 F (36.7 C) (Oral)   Resp 16   SpO2 95%  Wt Readings from Last 3 Encounters:  04/09/17 267 lb 12.8 oz (121.5 kg)  04/06/17 262 lb (118.8 kg)  03/05/17 266 lb (120.7 kg)   BP Readings from Last 3 Encounters:  04/27/17 (!) 141/64  04/09/17 (!) 159/85  04/06/17 (!) 165/86     Immunization  History  Administered Date(s) Administered  . Influenza Split 12/16/2010, 03/05/2012  .  Influenza Whole 12/21/2007, 12/26/2008, 12/20/2009, 12/26/2009  . Influenza, High Dose Seasonal PF 01/02/2017  . Influenza,inj,Quad PF,6+ Mos 12/17/2012, 12/02/2013, 12/01/2014, 12/13/2015  . Pneumococcal Conjugate-13 02/15/2013  . Pneumococcal Polysaccharide-23 03/21/2009  . Td 01/29/2009  . Tdap 06/27/2013  . Zoster 11/26/2010    Health Maintenance  Topic Date Due  . FOOT EXAM  05/14/1953  . COLONOSCOPY  05/22/2015  . OPHTHALMOLOGY EXAM  01/19/2018 (Originally 10/16/2014)  . HEMOGLOBIN A1C  07/14/2017  . TETANUS/TDAP  06/28/2023  . INFLUENZA VACCINE  Completed  . Hepatitis C Screening  Completed  . PNA vac Low Risk Adult  Completed    Lab Results  Component Value Date   WBC 8.1 04/06/2017   HGB 13.3 04/06/2017   HCT 41.5 04/06/2017   PLT 159 04/06/2017   GLUCOSE 125 (H) 04/09/2017   CHOL 134 01/14/2017   TRIG 136 01/14/2017   HDL 45 01/14/2017   LDLCALC 62 01/14/2017   ALT 29 04/09/2017   AST 33 04/09/2017   NA 140 04/09/2017   K 4.0 04/09/2017   CL 104 04/09/2017   CREATININE 1.02 04/09/2017   BUN 14 04/09/2017   CO2 30 04/09/2017   TSH 2.610 09/18/2016   PSA 0.19 08/16/2014   INR 1.9 03/05/2017   HGBA1C 6.2 (H) 01/14/2017   MICROALBUR <0.7 09/26/2014    Lab Results  Component Value Date   TSH 2.610 09/18/2016   Lab Results  Component Value Date   WBC 8.1 04/06/2017   HGB 13.3 04/06/2017   HCT 41.5 04/06/2017   MCV 90.8 04/06/2017   PLT 159 04/06/2017   Lab Results  Component Value Date   NA 140 04/09/2017   K 4.0 04/09/2017   CO2 30 04/09/2017   GLUCOSE 125 (H) 04/09/2017   BUN 14 04/09/2017   CREATININE 1.02 04/09/2017   BILITOT 0.8 04/09/2017   ALKPHOS 52 04/09/2017   AST 33 04/09/2017   ALT 29 04/09/2017   PROT 6.9 04/09/2017   ALBUMIN 3.8 04/09/2017   CALCIUM 8.7 04/09/2017   ANIONGAP 12 04/06/2017   GFR 75.90 04/09/2017   Lab Results  Component Value Date   CHOL 134 01/14/2017   Lab Results  Component Value Date   HDL 45  01/14/2017   Lab Results  Component Value Date   LDLCALC 62 01/14/2017   Lab Results  Component Value Date   TRIG 136 01/14/2017   Lab Results  Component Value Date   CHOLHDL 3.0 01/14/2017   Lab Results  Component Value Date   HGBA1C 6.2 (H) 01/14/2017      ekg-- no change from ER 1/21   Assessment & Plan:   Problem List Items Addressed This Visit      Unprioritized   Obstructive sleep apnea-Failed CPAP    Pt refuses cpap       SOB (shortness of breath) - Primary    Check labs and cxr  ekg-- no change from previous  If symptoms worsen go to ER Pt states O2 helps at night---  Really would benefit form cpap but pt refuses  Encouraged pt to f/u cardilogy as well      Relevant Orders   D-Dimer, Quantitative   Comprehensive metabolic panel   POCT Urinalysis Dipstick (Automated) (Completed)   DG Chest 2 View (Completed)   EKG 12-Lead (Completed)   Troponin I   CBC with Differential/Platelet   Thoracic aorta atherosclerosis ([place])  Weakness    Check labs No new neuro deficit       Relevant Orders   D-Dimer, Quantitative   Comprehensive metabolic panel   POCT Urinalysis Dipstick (Automated) (Completed)   EKG 12-Lead (Completed)   Troponin I   CBC with Differential/Platelet   POCT Influenza A/B (Completed)      I am having [name] maintain his cyanocobalamin, fluticasone, Multiple Vitamins-Minerals (MULTIVITAMIN GUMMIES MENS PO), nitroGLYCERIN, isosorbide mononitrate, atorvastatin, lactulose, benazepril, albuterol, furosemide, allopurinol, OXYGEN, pantoprazole, metFORMIN, warfarin, benzonatate, doxycycline, albuterol, levothyroxine, benazepril, clindamycin, traMADol, and traMADol.  No orders of the defined types were placed in this encounter.   CMA served as Education administrator during this visit. History, Physical and Plan performed by medical provider. Documentation and orders reviewed and attested to.  Ann Held, DO

## 2017-04-27 NOTE — Telephone Encounter (Signed)
Received call from [place] at [place] with STAT labs. CBC, D-dimer, troponin were within normal limits and should be available in Epic now.

## 2017-04-27 NOTE — Telephone Encounter (Signed)
Got it thank you

## 2017-04-27 NOTE — Telephone Encounter (Signed)
Received D Dimer quanitative results from [employer] . Result is 0.42. [facility] at  [facility] notified.

## 2017-04-27 NOTE — Patient Instructions (Signed)
Weakness  Weakness is a lack of strength. You may feel weak all over your body (generalized), or you may feel weak in one specific part of your body (focal). Common causes of weakness include:   Infection and immune system disorders.   Physical exhaustion.   Internal bleeding or other blood loss that results in a lack of red blood cells (anemia).   Dehydration.   An imbalance in mineral (electrolyte) levels, such as potassium.   Heart disease, circulation problems, or stroke.    Other causes include:   Some medicines or cancer treatment.   Stress, anxiety, or depression.   Nervous system disorders.   Thyroid disorders.   Loss of muscle strength because of age or inactivity.   Poor sleep quality or sleep disorders.    The cause of your weakness may not be known. Some causes of weakness can be serious, so it is important to see your health care provider.  Follow these instructions at home:   Rest as needed.   Try to get enough sleep. Talk to your health care provider about how much sleep you need each night.   Take over-the-counter and prescription medicines only as told by your health care provider.   Eat a healthy, well-balanced diet. This includes:  ? Proteins to build muscles, such as lean meats and fish.  ? Fresh fruits and vegetables.  ? Carbohydrates to boost energy, such as whole grains.   Drink enough fluid to keep your urine clear or pale yellow.   Do strength exercises, such as arm curls and leg raises, for 30 minutes at least 2 days a week or as told by your health care provider.   Consider working with a physical therapist or trainer who can develop an exercise plan to help you gain muscle strength.   Keep all follow-up visits as told by your health care provider. This is important.  Contact a health care provider if:   Your weakness does not improve or gets worse.   Your weakness affects your ability to think clearly.   Your weakness affects your ability to do your normal daily  activities.  Get help right away if:   You develop sudden weakness.   You have trouble breathing or shortness of breath.   You have problems with your vision.   You have trouble talking or swallowing.   You have trouble standing or walking.   You have chest pain.   You are light-headed or lose consciousness.  This information is not intended to replace advice given to you by your health care provider. Make sure you discuss any questions you have with your health care provider.  Document Released: 03/03/2005 Document Revised: 03/29/2015 Document Reviewed: 12/22/2014  Elsevier Interactive Patient Education  2018 Elsevier Inc[employer]

## 2017-04-27 NOTE — Assessment & Plan Note (Signed)
Check labs No new neuro deficit

## 2017-04-29 ENCOUNTER — Telehealth: Payer: Self-pay | Admitting: Internal Medicine

## 2017-04-29 ENCOUNTER — Other Ambulatory Visit: Payer: Self-pay

## 2017-04-29 ENCOUNTER — Encounter (HOSPITAL_COMMUNITY): Payer: Self-pay

## 2017-04-29 ENCOUNTER — Emergency Department (HOSPITAL_COMMUNITY)
Admission: EM | Admit: 2017-04-29 | Discharge: 2017-04-29 | Disposition: A | Discharge status: Home / Self Care | Payer: Medicare Other | Attending: Emergency Medicine | Admitting: Emergency Medicine

## 2017-04-29 DIAGNOSIS — Z5321 Procedure and treatment not carried out due to patient leaving prior to being seen by health care provider: Secondary | ICD-10-CM | POA: Diagnosis not present

## 2017-04-29 DIAGNOSIS — R2689 Other abnormalities of gait and mobility: Secondary | ICD-10-CM | POA: Diagnosis not present

## 2017-04-29 LAB — CBC
HEMATOCRIT: 43 % (ref 39.0–52.0)
Hemoglobin: 13.7 g/dL (ref 13.0–17.0)
MCH: 29.3 pg (ref 26.0–34.0)
MCHC: 31.9 g/dL (ref 30.0–36.0)
MCV: 91.9 fL (ref 78.0–100.0)
Platelets: 202 10*3/uL (ref 150–400)
RBC: 4.68 MIL/uL (ref 4.22–5.81)
RDW: 13.8 % (ref 11.5–15.5)
WBC: 7.2 10*3/uL (ref 4.0–10.5)

## 2017-04-29 LAB — BASIC METABOLIC PANEL
Anion gap: 13 (ref 5–15)
BUN: 17 mg/dL (ref 6–20)
CHLORIDE: 103 mmol/L (ref 101–111)
CO2: 24 mmol/L (ref 22–32)
Calcium: 9.2 mg/dL (ref 8.9–10.3)
Creatinine, Ser: 1.26 mg/dL — ABNORMAL HIGH (ref 0.61–1.24)
GFR calc non Af Amer: 55 mL/min — ABNORMAL LOW (ref >60)
Glucose, Bld: 148 mg/dL — ABNORMAL HIGH (ref 65–99)
POTASSIUM: 4 mmol/L (ref 3.5–5.1)
SODIUM: 140 mmol/L (ref 135–145)

## 2017-04-29 LAB — CBG MONITORING, ED: Glucose-Capillary: 120 mg/dL — ABNORMAL HIGH (ref 65–99)

## 2017-04-29 NOTE — Telephone Encounter (Signed)
Patient calling , is experiencing lightheadedness and dizziness.  Patient is scheduled to see Dr. Saunders Revel 04-30-17 @ 3pm

## 2017-04-29 NOTE — Telephone Encounter (Signed)
Spoke with patient in regards to his symptoms worsening.    SOB has progressed and beginning to feel pain in the arms, primarily the left.   I suggested that he travel to the ED via Ambulance, but he seemed somewhat reluctant, although he verbalized an understanding as to why.Marland Kitchen

## 2017-04-29 NOTE — Telephone Encounter (Signed)
[name] is wanting to speak with [name]. Please call

## 2017-04-29 NOTE — Telephone Encounter (Signed)
I agree. He should call 911 and be taken to the nearest ED for further evaluation. He should not defer further evaluation of his symptoms and wait until tomorrow's office visit.  Nelva Bush, MD [facility] Pager: [phone]

## 2017-04-29 NOTE — ED Triage Notes (Signed)
Per Pt, Pt is coming from home with complaints of gait problem that started a couple days ago. Was seen by PCP and they evaluated patient. Pt was sent home from the office. Reports being SOB. Pt was negative for the flu. Some congestion with no cough. Hx of Chronic Oxygen at Night. Denies any confusion or unilateral weakness.

## 2017-04-29 NOTE — Telephone Encounter (Signed)
Looks like BP has been running normal to high. I recommend that he continue his current medications pending his clinic visit with me tomorrow.  Nelva Bush, MD [facility] Pager: [phone]

## 2017-04-29 NOTE — ED Notes (Signed)
Pt called several times for room. No answer.

## 2017-04-29 NOTE — Telephone Encounter (Signed)
Spoke with patient in regards to his SOB and light headedness. No symptoms of CP or otherwise.    He saw PCP 2/12, who also addressed this.  He is supposed to wear a CPAP at night, but the patient refuses.  He does wear 2 lpm of O2 at bedtime.    The PCP encouraged him to follow up with a cardiologist, which he is seeing (Dr End) on 2/14.  He was told by PCP that if his symptoms worsen, he needs to go to the ED.

## 2017-04-29 NOTE — Telephone Encounter (Signed)
Spoke with patient's wife and gave her Dr. Darnelle Bos recommendation..   She verbalized understanding and we'll see them tomorrow.

## 2017-04-30 ENCOUNTER — Encounter: Payer: Self-pay | Admitting: Internal Medicine

## 2017-04-30 ENCOUNTER — Ambulatory Visit (INDEPENDENT_AMBULATORY_CARE_PROVIDER_SITE_OTHER): Payer: Medicare Other | Admitting: *Deleted

## 2017-04-30 ENCOUNTER — Ambulatory Visit (INDEPENDENT_AMBULATORY_CARE_PROVIDER_SITE_OTHER): Payer: Medicare Other | Admitting: Internal Medicine

## 2017-04-30 DIAGNOSIS — Z7901 Long term (current) use of anticoagulants: Secondary | ICD-10-CM

## 2017-04-30 DIAGNOSIS — E7800 Pure hypercholesterolemia, unspecified: Secondary | ICD-10-CM

## 2017-04-30 DIAGNOSIS — I25118 Atherosclerotic heart disease of native coronary artery with other forms of angina pectoris: Secondary | ICD-10-CM | POA: Diagnosis not present

## 2017-04-30 DIAGNOSIS — I4891 Unspecified atrial fibrillation: Secondary | ICD-10-CM | POA: Diagnosis not present

## 2017-04-30 DIAGNOSIS — I5032 Chronic diastolic (congestive) heart failure: Secondary | ICD-10-CM

## 2017-04-30 DIAGNOSIS — R5383 Other fatigue: Secondary | ICD-10-CM

## 2017-04-30 DIAGNOSIS — I10 Essential (primary) hypertension: Secondary | ICD-10-CM | POA: Diagnosis not present

## 2017-04-30 DIAGNOSIS — I480 Paroxysmal atrial fibrillation: Secondary | ICD-10-CM | POA: Diagnosis not present

## 2017-04-30 LAB — POCT INR: INR: 1.5

## 2017-04-30 NOTE — Patient Instructions (Signed)
Description   When gets home take another 1/2 tablet as he has already taken 1/2 tablet making total of 2.5mg  today Feb 14th then tomorrow Feb 15th take 1 and 1/2 tablets (3.75mg )  then continue taking 1/2 tablet everyday except 1 tablet on Mondays, Wednesdays, and Fridays. . Recheck INR 1 week.

## 2017-04-30 NOTE — Patient Instructions (Addendum)
Medication Instructions:   none  -- If you need a refill on your cardiac medications before your next appointment, please call your pharmacy. --  Labwork: None ordered  Testing/Procedures: Your physician has requested that you have a lexiscan myoview.   For further information please visit [email or web address]. Please follow instruction sheet, as given.   Follow-Up: Your physician wants you to follow-up in: 1 month with Dr. Saunders Revel.     Thank you for choosing [facility]!!    Any Other Special Instructions Will Be Listed Below (If Applicable).

## 2017-04-30 NOTE — Progress Notes (Signed)
Follow-up Outpatient Visit Date: 04/30/2017  Primary Care Provider: Debbrah Alar, NP [address] [place] [address]  Chief Complaint: Fatigue and right arm numbness and pain  HPI:  Mr. Guardado is a 72 y.o. year-old male with history of coronary artery disease status post CABG, chronic diastolic heart failure, paroxysmal atrial fibrillation, stroke, hypertension, hyperlipidemia, and obstructive sleep apnea, who presents for urgent evaluation of dizziness and shortness of breath. He contacted our office yesterday complaining of lightheadedness/dizziness. Later in the day, he reported worsening shortness of breath and bilateral arm pain (left > right). He was advised to go the ED, which he did. However, he left before being seen due to extended wait times.  Today, Mr. Sites has multiple complaints which are difficult to follow.  He initially complains of numbness and pain that begins in the right hand and then travels proximally to the forearm and upper arm.  It happens intermittently, often when he is driving.  He has experienced this for years following his stroke on the left side, but feels like it is new on the right.  He also complains of what feels like a "neuropathy" in both legs.  This has impaired his balance.  Fortunately, he has not fallen.  He denies lightheadedness.  Mr. Marchitto also reports some shortness of breath, though this seems to have improved over the last few weeks after he discontinued atorvastatin.  He has continued to feel fatigued, which concerns him because that was 1 of the presenting symptoms before his MI.  He denies chest pain, palpitations, orthopnea, and PND.  He notes chronic swelling of the left ankle that is unchanged.  He is concerned that his multiple symptoms could be related to a problem with his stents and requests further coronary assessment.  He remains compliant with his medications, including warfarin.  He denies  bleeding.  --------------------------------------------------------------------------------------------------  Cardiovascular History & Procedures: Cardiovascular Problems:  Coronary artery disease  Paroxysmal atrial fibrillation  Chronic diastolic heart failure  Stroke with residual left-sided weakness  Risk Factors:  Known CAD, stroke, hypertension, and hyperlipidemia  Cath/PCI:  LHC (10/25/15): LMCA normal. LAD with 90% ostial stenosis with competitive flow in the midvessel. D1 ostially occluded. LCx with 95% ostial stenosis. 30% mid LCx ISR. Ostially occluded OM2. Small, non-dominant RCA that is chronically occluded. LIMA->LAD patent with 95% stenosis in the apical LAD distal to the anastomosis. Patent SVG Y-graft to D1 and OM2. SVG->PDA known to be occluded.  CV Surgery:  CABG (LIMA->LAD, SVG Y-graft ->D1 and OM2, and SVG->PDA)  EP Procedures and Devices:  None  Non-Invasive Evaluation(s):  TTE (04/26/15): Normal LV size with mild LVH. LVEF 50-55% with normal wall motion and diastolic function. Mildly thickened aortic valve. Mild left atrial enlargement. Moderate TR. Normal RV size and function.  Recent CV Pertinent Labs: Lab Results  Component Value Date   CHOL 134 01/14/2017   CHOL 138 12/05/2016   HDL 45 01/14/2017   HDL 46 12/05/2016   LDLCALC 62 01/14/2017   LDLCALC 67 12/05/2016   TRIG 136 01/14/2017   CHOLHDL 3.0 01/14/2017   INR 1.9 03/05/2017   INR 1.75 01/13/2017   BNP 111.5 (H) 01/13/2017   BNP 82.2 11/05/2015   K 4.0 04/29/2017   MG 2.1 10/22/2015   BUN 17 04/29/2017   BUN 14 09/18/2016   CREATININE 1.26 (H) 04/29/2017   CREATININE 1.27 (H) 11/05/2015    Past medical and surgical history were reviewed and updated in EPIC.  Current Meds  Medication Sig  . albuterol (PROVENTIL HFA;VENTOLIN HFA) 108 (90 Base) MCG/ACT inhaler Inhale 2 puffs into the lungs every 6 (six) hours as needed for wheezing or shortness of breath.  . allopurinol  (ZYLOPRIM) 100 MG tablet TAKE 2 TABLETS EVERY DAY (Patient taking differently: Take 200 mg by mouth in the morning)  . benazepril (LOTENSIN) 10 MG tablet Take 1 tablet (10 mg total) by mouth daily. (Patient taking differently: Take 10 mg by mouth every morning. Takes with 5 mg to equal 15 mg)  . benazepril (LOTENSIN) 5 MG tablet Take 5 mg by mouth daily. Pt takes with 10 mg to equal 15 mg  . benzonatate (TESSALON) 200 MG capsule Take 1 capsule (200 mg total) by mouth 3 (three) times daily as needed for cough.  . clindamycin (CLEOCIN) 300 MG capsule Take 1 capsule (300 mg total) by mouth 4 (four) times daily. X 7 days  . fluticasone (FLONASE) 50 MCG/ACT nasal spray Place 2 sprays into both nostrils daily. (Patient taking differently: Place 2 sprays into both nostrils daily as needed for allergies. )  . furosemide (LASIX) 40 MG tablet Take 1 tablet (40 mg total) by mouth every other day.  . isosorbide mononitrate (IMDUR) 30 MG 24 hr tablet Take 0.5 tablets (15 mg total) by mouth daily. (Patient taking differently: Take 15 mg by mouth every morning. )  . lactulose (CHRONULAC) 10 GM/15ML solution Take 15 mLs (10 g total) by mouth daily as needed for mild constipation.  Marland Kitchen levothyroxine (SYNTHROID, LEVOTHROID) 112 MCG tablet Take 1 tablet (112 mcg total) by mouth daily before breakfast.  . metFORMIN (GLUCOPHAGE) 500 MG tablet Take 1 tablet (500 mg total) daily with breakfast by mouth.  . Multiple Vitamins-Minerals (MULTIVITAMIN GUMMIES MENS PO) Take 1 tablet by mouth daily with breakfast.   . nitroGLYCERIN (NITROSTAT) 0.4 MG SL tablet Place 1 tablet (0.4 mg total) under the tongue every 5 (five) minutes as needed for chest pain. Up to 3 doses  . OXYGEN Inhale 2 L into the lungs at bedtime.  . pantoprazole (PROTONIX) 40 MG tablet Take 1 tablet (40 mg total) every morning by mouth.  . traMADol (ULTRAM) 50 MG tablet Take 1 tablet (50 mg total) by mouth 2 (two) times daily.  . traMADol (ULTRAM-ER) 200 MG 24 hr  tablet Take 1 tablet (200 mg total) by mouth daily.  . vitamin B-12 1000 MCG tablet Take 1 tablet (1,000 mcg total) by mouth daily.  Marland Kitchen warfarin (COUMADIN) 2.5 MG tablet Take as directed by coumadin clinic (Patient taking differently: Take 1.25-2.5 mg by mouth See admin instructions. 2.5 MG Monday, Wednesday & Friday. All other days- 1.25 mg ( 0.5 of 2.5 mg tablet ))    Allergies: Patient has no known allergies.  Social History   Socioeconomic History  . Marital status: Married    Spouse name: [name]  . Number of children: Not on file  . Years of education: 31  . Highest education level: Not on file  Social Needs  . Financial resource strain: Not on file  . Food insecurity - worry: Not on file  . Food insecurity - inability: Not on file  . Transportation needs - medical: Not on file  . Transportation needs - non-medical: Not on file  Occupational History  . 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 Smoking status: Never Smoker  . Smokeless tobacco: Never Used  Substance and Sexual Activity  .  Alcohol use: No    Alcohol/week: 0.0 oz    Comment: 1 beer a month  . Drug use: No  . Sexual activity: No  Other Topics Concern  . Not on file  Social History Narrative   Pt lives with wife. Does have stairs, but patient doesn't use them. Pt has completed technical school    Family History  Problem Relation Age of Onset  . Lung cancer Father        deceased  . Stroke Mother        deceased-MINISTROKES    Review of Systems: A 12-system review of systems was performed and was negative except as noted in the HPI.  --------------------------------------------------------------------------------------------------  Physical Exam: BP 122/74   Pulse 87   Ht 6\' 3"  (1.905 m)   Wt 262 lb 12.8 oz (119.2 kg)   SpO2 95%   BMI 32.85 kg/m   General: Obese man, seated comfortably in the exam room.  He ambulates with some difficulty and uses a  rolling walker for support. HEENT: No conjunctival pallor or scleral icterus. Moist mucous membranes.  OP clear. Neck: Supple without lymphadenopathy, thyromegaly, JVD, or HJR. Lungs: Normal work of breathing. Clear to auscultation bilaterally without wheezes or crackles. Heart: Regular rate and rhythm without murmurs, rubs, or gallops. Non-displaced PMI. Abd: Bowel sounds present. Soft, NT/ND without hepatosplenomegaly Ext: 1+ left ankle edema.  2+ radial and pedal pulses bilaterally. Skin: Warm and dry without rash.  EKG (04/29/17): Sinus tachycardia with PACs, LAFB, and borderline LVH.  Lab Results  Component Value Date   WBC 7.2 04/29/2017   HGB 13.7 04/29/2017   HCT 43.0 04/29/2017   MCV 91.9 04/29/2017   PLT 202 04/29/2017    Lab Results  Component Value Date   NA 140 04/29/2017   K 4.0 04/29/2017   CL 103 04/29/2017   CO2 24 04/29/2017   BUN 17 04/29/2017   CREATININE 1.26 (H) 04/29/2017   GLUCOSE 148 (H) 04/29/2017   ALT 36 04/27/2017    Lab Results  Component Value Date   CHOL 134 01/14/2017   HDL 45 01/14/2017   LDLCALC 62 01/14/2017   TRIG 136 01/14/2017   CHOLHDL 3.0 01/14/2017    --------------------------------------------------------------------------------------------------  ASSESSMENT AND PLAN: Coronary artery disease with stable angina and fatigue Mr. Fredericksen has multiple vague complaints today, none of which include chest pain or shortness of breath.  However, he is concerned about the patency of his stents and bypasses, given that he experienced similar fatigue before his coronary interventions in the past.  We have discussed further evaluation and have agreed to obtain a pharmacologic myocardial perfusion stress test (he is unable to exercise due to his prior stroke with residual deficits).  In the meantime, we will continue his current medications for secondary prevention, as well as antianginal therapy with isosorbide mononitrate.  He is not on a  statin at this time, which we will readdress at follow-up.  Paroxysmal atrial fibrillation Yesterday's EKG showed sinus tachycardia.  No symptoms to suggest recurrence of atrial fibrillation.  Continue indefinite anticoagulation with warfarin.  Continue follow-up with Dr. 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  Chronic diastolic heart failure Other than mild chronic left ankle edema, Mr. Fults appears euvolemic.  No medication changes at this time.  Hypertension Blood pressure normal today.  No medication changes at this time.  Hyperlipidemia Mr. Keir was at goal in regard to his LDL on atorvastatin.  However, he stopped this at some point  in the last few weeks, thinking it was causing his shortness of breath.  We will readdress restarting atorvastatin or trying another statin when he returns for follow-up after his stress test.  Follow-up: Return to clinic in 1 month.  Nelva Bush, MD 04/30/2017 4:01 PM

## 2017-05-01 ENCOUNTER — Encounter: Payer: Self-pay | Admitting: Internal Medicine

## 2017-05-01 DIAGNOSIS — E7800 Pure hypercholesterolemia, unspecified: Secondary | ICD-10-CM | POA: Insufficient documentation

## 2017-05-05 ENCOUNTER — Telehealth (HOSPITAL_COMMUNITY): Payer: Self-pay | Admitting: *Deleted

## 2017-05-05 NOTE — Telephone Encounter (Signed)
Left message on voicemail per DPR in reference to upcoming appointment scheduled on 05/08/17 at [facility] with detailed instructions given per Myocardial Perfusion Study Information Sheet for the test. LM to arrive 15 minutes early, and that it is imperative to arrive on time for appointment to keep from having the test rescheduled. If you need to cancel or reschedule your appointment, please call the office within 24 hours of your appointment. Failure to do so may result in a cancellation of your appointment, and a $50 no show fee. Phone number given for call back for any questions.

## 2017-05-07 ENCOUNTER — Ambulatory Visit (INDEPENDENT_AMBULATORY_CARE_PROVIDER_SITE_OTHER): Payer: Medicare Other | Admitting: *Deleted

## 2017-05-07 ENCOUNTER — Ambulatory Visit (HOSPITAL_COMMUNITY): Payer: Medicare Other | Attending: Internal Medicine

## 2017-05-07 DIAGNOSIS — I25118 Atherosclerotic heart disease of native coronary artery with other forms of angina pectoris: Secondary | ICD-10-CM | POA: Diagnosis not present

## 2017-05-07 DIAGNOSIS — I4891 Unspecified atrial fibrillation: Secondary | ICD-10-CM

## 2017-05-07 DIAGNOSIS — Z7901 Long term (current) use of anticoagulants: Secondary | ICD-10-CM | POA: Diagnosis not present

## 2017-05-07 LAB — MYOCARDIAL PERFUSION IMAGING
CHL CUP NUCLEAR SRS: 10
CHL CUP RESTING HR STRESS: 68 {beats}/min
LHR: 0.49
LV dias vol: 105 mL (ref 62–150)
LVSYSVOL: 50 mL
NUC STRESS TID: 0.95
Peak HR: 82 {beats}/min
SDS: 4
SSS: 14

## 2017-05-07 LAB — POCT INR: INR: 2.2

## 2017-05-07 MED ORDER — TECHNETIUM TC 99M TETROFOSMIN IV KIT
10.9000 | PACK | Freq: Once | INTRAVENOUS | Status: AC | PRN
  Administered 2017-05-07: 10.9 via INTRAVENOUS
  Filled 2017-05-07: qty 11

## 2017-05-07 MED ORDER — REGADENOSON 0.4 MG/5ML IV SOLN
0.4000 mg | Freq: Once | INTRAVENOUS | Status: AC
  Administered 2017-05-07: 0.4 mg via INTRAVENOUS

## 2017-05-07 MED ORDER — TECHNETIUM TC 99M TETROFOSMIN IV KIT
32.8000 | PACK | Freq: Once | INTRAVENOUS | Status: AC | PRN
  Administered 2017-05-07: 32.8 via INTRAVENOUS
  Filled 2017-05-07: qty 33

## 2017-05-07 NOTE — Patient Instructions (Signed)
Description   Continue taking 1/2 tablet everyday except 1 tablet on Mondays, Wednesdays, and Fridays. . Recheck INR 2 week.

## 2017-05-08 ENCOUNTER — Encounter (HOSPITAL_COMMUNITY): Payer: Medicare Other

## 2017-05-21 ENCOUNTER — Other Ambulatory Visit: Payer: Self-pay | Admitting: Physical Medicine & Rehabilitation

## 2017-05-21 ENCOUNTER — Other Ambulatory Visit: Payer: Self-pay | Admitting: Family

## 2017-05-21 ENCOUNTER — Other Ambulatory Visit: Payer: Self-pay | Admitting: Medical

## 2017-05-21 ENCOUNTER — Other Ambulatory Visit: Payer: Self-pay | Admitting: Cardiology

## 2017-05-22 ENCOUNTER — Ambulatory Visit (INDEPENDENT_AMBULATORY_CARE_PROVIDER_SITE_OTHER): Payer: Medicare Other | Admitting: *Deleted

## 2017-05-22 DIAGNOSIS — Z7901 Long term (current) use of anticoagulants: Secondary | ICD-10-CM

## 2017-05-22 DIAGNOSIS — I4891 Unspecified atrial fibrillation: Secondary | ICD-10-CM | POA: Diagnosis not present

## 2017-05-22 LAB — POCT INR: INR: 1.7

## 2017-05-22 MED ORDER — WARFARIN SODIUM 2.5 MG PO TABS: ORAL_TABLET | ORAL | 0 refills | Status: DC

## 2017-05-22 MED ORDER — TRAMADOL HCL ER 200 MG PO TB24: 200.0000 mg | ORAL_TABLET | Freq: Every day | ORAL | 0 refills | Status: DC

## 2017-05-22 MED ORDER — TRAMADOL HCL 50 MG PO TABS: 50.0000 mg | ORAL_TABLET | Freq: Two times a day (BID) | ORAL | 0 refills | Status: DC

## 2017-05-22 MED ORDER — LEVOTHYROXINE SODIUM 112 MCG PO TABS: 112.0000 ug | ORAL_TABLET | Freq: Every day | ORAL | 0 refills | Status: DC

## 2017-05-22 NOTE — Telephone Encounter (Signed)
Levothyroxine refill sent to pharmacy.  Pt's last TSH was 05/2016.  Pt last seen 04/09/17 and has f/u on 06/2017 with PCP.

## 2017-05-22 NOTE — Patient Instructions (Signed)
Description   Today take 1.5 tablets then continue taking 1/2 tablet everyday except 1 tablet on Mondays, Wednesdays, and Fridays.  Recheck INR 2 weeks. Main [phone]

## 2017-05-22 NOTE — Telephone Encounter (Signed)
Received refill requests on albuterol and benzonatate. These were prescribed by PA, Saguier in December. Denial sent to pharmacy that pt should contact office first. If pt is currently having symptoms requiring refills, he should be evaluated in the office first.

## 2017-05-24 ENCOUNTER — Other Ambulatory Visit: Payer: Self-pay | Admitting: Physician Assistant

## 2017-05-26 NOTE — Telephone Encounter (Signed)
Should the patient be taking 15 mg or 30 mg of isosorbide qd? Last office visit and current med list have 15 mg qd listed but pharmacy is requesting 30 mg qd. Please advise. Thanks, MI

## 2017-05-27 ENCOUNTER — Other Ambulatory Visit: Payer: Self-pay

## 2017-05-27 NOTE — Telephone Encounter (Signed)
Patient has been getting 30 mg tablets from pharmacy, but only taking half a tablet to total 15 mg daily

## 2017-05-29 ENCOUNTER — Encounter: Payer: Self-pay | Admitting: *Deleted

## 2017-06-08 ENCOUNTER — Ambulatory Visit (INDEPENDENT_AMBULATORY_CARE_PROVIDER_SITE_OTHER): Payer: Medicare Other | Admitting: *Deleted

## 2017-06-08 ENCOUNTER — Ambulatory Visit (INDEPENDENT_AMBULATORY_CARE_PROVIDER_SITE_OTHER): Payer: Medicare Other | Admitting: Internal Medicine

## 2017-06-08 ENCOUNTER — Encounter: Payer: Self-pay | Admitting: Internal Medicine

## 2017-06-08 VITALS — BP 130/82 | HR 68 | Ht 75.0 in | Wt 269.4 lb

## 2017-06-08 DIAGNOSIS — I10 Essential (primary) hypertension: Secondary | ICD-10-CM | POA: Diagnosis not present

## 2017-06-08 DIAGNOSIS — Z7901 Long term (current) use of anticoagulants: Secondary | ICD-10-CM | POA: Diagnosis not present

## 2017-06-08 DIAGNOSIS — I5032 Chronic diastolic (congestive) heart failure: Secondary | ICD-10-CM | POA: Diagnosis not present

## 2017-06-08 DIAGNOSIS — E7800 Pure hypercholesterolemia, unspecified: Secondary | ICD-10-CM

## 2017-06-08 DIAGNOSIS — I25118 Atherosclerotic heart disease of native coronary artery with other forms of angina pectoris: Secondary | ICD-10-CM | POA: Diagnosis not present

## 2017-06-08 DIAGNOSIS — I4891 Unspecified atrial fibrillation: Secondary | ICD-10-CM | POA: Diagnosis not present

## 2017-06-08 DIAGNOSIS — I480 Paroxysmal atrial fibrillation: Secondary | ICD-10-CM

## 2017-06-08 LAB — POCT INR: INR: 1.8

## 2017-06-08 MED ORDER — ATORVASTATIN CALCIUM 40 MG PO TABS: 40.0000 mg | ORAL_TABLET | Freq: Every day | ORAL | 3 refills | Status: DC

## 2017-06-08 NOTE — Patient Instructions (Addendum)
Medication Instructions:  Atorvastatin (LIPITOR) 40 mg daily by mouth   -- If you need a refill on your cardiac medications before your next appointment, please call your pharmacy. --  Labwork: None ordered  Testing/Procedures: None ordered  Follow-Up: Your physician wants you to follow-up in: 6 months with Dr. Saunders Revel.    You will receive a reminder letter in the mail two months in advance. If you don't receive a letter, please call our office to schedule the follow-up appointment.  Thank you for choosing [facility]!!    Any Other Special Instructions Will Be Listed Below (If Applicable).

## 2017-06-08 NOTE — Patient Instructions (Signed)
Description   Today take 1.5 tablets then start taking 1 tablet everyday except 1/2 tablet on Sundays, Tuesdays, and Thursdays.  Recheck INR 2 weeks. Main [phone]

## 2017-06-08 NOTE — Progress Notes (Signed)
Follow-up Outpatient Visit Date: 06/08/2017  Primary Care Provider: Debbrah Alar, NP [address] [place] [address]  Chief Complaint: Follow-up coronary artery disease and diastolic heart failure  HPI:  Mr. Hoon is a 72 y.o. year-old male with history of  coronary artery disease status post CABG, chronic diastolic heart failure, paroxysmal atrial fibrillation, stroke, hypertension, hyperlipidemia, and obstructive sleep apnea, who presents for follow-up of CAD, CHF, and PAF. I last saw Mr. Crooke in mid February, at which time he had multiple complaints including numbness and pain in the right arm/hand, as well as "neuropathy" in both feet. He also reported shortness of breath that he attributed to atorvastatin (though I suspect that he was confusing this with amiodarone, which was discontinued last summer due to dyspnea). He was concerned that some of the vague symptoms could be related to worsening coronary insufficiency. Stress test was low risk with small, fixed inferolateral defect. I recommended increasing isosorbide mononitrate to 30 mg daily to see if his symptoms would improve.  Today, Mr. Barg reports feeling about the same or better.  He is tolerating increased dose of isosorbide mononitrate well.  His right arm paresthesias are unchanged.  He is concerned that his left arm seems to have less range of motion.  He denies having any further episodes of shortness of breath.  He also denies chest pain as well as palpitations, lightheadedness, orthopnea, and PND.  He has stable left lower extremity edema.  He has not had any bleeding or falls.  He notes that his blood pressure at home is typically in the 120s/60s.  --------------------------------------------------------------------------------------------------  Cardiovascular History & Procedures: Cardiovascular Problems:  Coronary artery disease  Paroxysmal atrial fibrillation  Chronic diastolic heart  failure  Stroke with residual left-sided weakness  Risk Factors:  Known CAD, stroke, hypertension, and hyperlipidemia  Cath/PCI:  LHC (10/25/15): LMCA normal. LAD with 90% ostial stenosis with competitive flow in the midvessel. D1 ostially occluded. LCx with 95% ostial stenosis. 30% mid LCx ISR. Ostially occluded OM2. Small, non-dominant RCA that is chronically occluded. LIMA->LAD patent with 95% stenosis in the apical LAD distal to the anastomosis. Patent SVG Y-graft to D1 and OM2. SVG->PDA known to be occluded.  CV Surgery:  CABG (LIMA->LAD, SVG Y-graft ->D1 and OM2, and SVG->PDA)  EP Procedures and Devices:  None  Non-Invasive Evaluation(s):  Pharmacologic MPI ( 05/07/17): Low risk study with small inferolateral defect consistent with attenuation artifact and/or subendocardial scar. No ischemia. LVEF 53%.  TTE (04/26/15): Normal LV size with mild LVH. LVEF 50-55% with normal wall motion and diastolic function. Mildly thickened aortic valve. Mild left atrial enlargement. Moderate TR. Normal RV size and function.  Recent CV Pertinent Labs: Lab Results  Component Value Date   CHOL 134 01/14/2017   CHOL 138 12/05/2016   HDL 45 01/14/2017   HDL 46 12/05/2016   LDLCALC 62 01/14/2017   LDLCALC 67 12/05/2016   TRIG 136 01/14/2017   CHOLHDL 3.0 01/14/2017   INR 1.7 05/22/2017   INR 1.75 01/13/2017   BNP 111.5 (H) 01/13/2017   BNP 82.2 11/05/2015   K 4.0 04/29/2017   MG 2.1 10/22/2015   BUN 17 04/29/2017   BUN 14 09/18/2016   CREATININE 1.26 (H) 04/29/2017   CREATININE 1.27 (H) 11/05/2015    Past medical and surgical history were reviewed and updated in EPIC.  Current Meds  Medication Sig  . albuterol (PROVENTIL HFA;VENTOLIN HFA) 108 (90 Base) MCG/ACT inhaler Inhale 2 puffs into  the lungs every 6 (six) hours as needed for wheezing or shortness of breath.  . allopurinol (ZYLOPRIM) 100 MG tablet TAKE 2 TABLETS EVERY DAY  . benazepril (LOTENSIN) 10 MG tablet Take 1  tablet (10 mg total) by mouth daily.  . benazepril (LOTENSIN) 5 MG tablet Take 5 mg by mouth daily. Pt takes with 10 mg to equal 15 mg  . benzonatate (TESSALON) 200 MG capsule Take 1 capsule (200 mg total) by mouth 3 (three) times daily as needed for cough.  . clindamycin (CLEOCIN) 300 MG capsule Take 1 capsule (300 mg total) by mouth 4 (four) times daily. X 7 days  . fluticasone (FLONASE) 50 MCG/ACT nasal spray Place 2 sprays into both nostrils daily.  . furosemide (LASIX) 40 MG tablet Take 1 tablet (40 mg total) by mouth every other day.  . isosorbide mononitrate (IMDUR) 30 MG 24 hr tablet Take 0.5 tablets (15 mg total) by mouth daily.  Marland Kitchen lactulose (CHRONULAC) 10 GM/15ML solution Take 15 mLs (10 g total) by mouth daily as needed for mild constipation.  Marland Kitchen levothyroxine (SYNTHROID, LEVOTHROID) 112 MCG tablet Take 1 tablet (112 mcg total) by mouth daily before breakfast.  . metFORMIN (GLUCOPHAGE) 500 MG tablet Take 1 tablet (500 mg total) daily with breakfast by mouth.  . Multiple Vitamins-Minerals (MULTIVITAMIN GUMMIES MENS PO) Take 1 tablet by mouth daily with breakfast.   . nitroGLYCERIN (NITROSTAT) 0.4 MG SL tablet Place 1 tablet (0.4 mg total) under the tongue every 5 (five) minutes as needed for chest pain. Up to 3 doses  . OXYGEN Inhale 2 L into the lungs at bedtime.  . pantoprazole (PROTONIX) 40 MG tablet Take 1 tablet (40 mg total) every morning by mouth.  . traMADol (ULTRAM) 50 MG tablet Take 1 tablet (50 mg total) by mouth 2 (two) times daily.  . traMADol (ULTRAM-ER) 200 MG 24 hr tablet Take 1 tablet (200 mg total) by mouth daily.  . vitamin B-12 1000 MCG tablet Take 1 tablet (1,000 mcg total) by mouth daily.  Marland Kitchen warfarin (COUMADIN) 2.5 MG tablet Take as directed by coumadin clinic    Allergies: Patient has no known allergies.  Social History   Socioeconomic History  . Marital status: Married    Spouse name: [name]  . Number of children: Not on file  . Years of education: 75  .  Highest education level: Not on file  Occupational History  . 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 Financial resource strain: Not on file  . Food insecurity:    Worry: Not on file    Inability: Not on file  . Transportation needs:    Medical: Not on file    Non-medical: Not on file  Tobacco Use  . Smoking status: Never Smoker  . Smokeless tobacco: Never Used  Substance and Sexual Activity  . Alcohol use: No    Alcohol/week: 0.0 oz    Comment: 1 beer a month  . Drug use: No  . Sexual activity: Never  Lifestyle  . Physical activity:    Days per week: Not on file    Minutes per session: Not on file  . Stress: Not on file  Relationships  . Social connections:    Talks on phone: Not on file    Gets together: Not on file    Attends religious service: Not on file    Active member of club or organization: Not on file  Attends meetings of clubs or organizations: Not on file    Relationship status: Not on file  . Intimate partner violence:    Fear of current or ex partner: Not on file    Emotionally abused: Not on file    Physically abused: Not on file    Forced sexual activity: Not on file  Other Topics Concern  . Not on file  Social History Narrative   Pt lives with wife. Does have stairs, but patient doesn't use them. Pt has completed technical school    Family History  Problem Relation Age of Onset  . Lung cancer Father        deceased  . Stroke Mother        deceased-MINISTROKES    Review of Systems: A 12-system review of systems was performed and was negative except as noted in the HPI.  --------------------------------------------------------------------------------------------------  Physical Exam: BP 130/82   Pulse 68   Ht 6\' 3"  (1.905 m)   Wt 269 lb 6.4 oz (122.2 kg)   SpO2 99%   BMI 33.67 kg/m   General: No acute distress HEENT: No conjunctival pallor or scleral icterus. Moist mucous membranes.  OP  clear. Neck: Supple without lymphadenopathy, thyromegaly, JVD, or HJR. Lungs: Normal work of breathing. Clear to auscultation bilaterally without wheezes or crackles. Heart: Regular rate and rhythm without murmurs, rubs, or gallops. Non-displaced PMI.  Well-healed median sternotomy scar. Abd: Bowel sounds present. Soft, NT/ND 8 unable to assess HSM due to body habitus. Ext: No lower extremity edema. Radial, PT, and DP pulses are 2+ bilaterally. Skin: Warm and dry without rash.  EKG: Normal sinus rhythm with left axis deviation, borderline LVH, and nonspecific T wave flattening.  Lab Results  Component Value Date   WBC 7.2 04/29/2017   HGB 13.7 04/29/2017   HCT 43.0 04/29/2017   MCV 91.9 04/29/2017   PLT 202 04/29/2017    Lab Results  Component Value Date   NA 140 04/29/2017   K 4.0 04/29/2017   CL 103 04/29/2017   CO2 24 04/29/2017   BUN 17 04/29/2017   CREATININE 1.26 (H) 04/29/2017   GLUCOSE 148 (H) 04/29/2017   ALT 36 04/27/2017    Lab Results  Component Value Date   CHOL 134 01/14/2017   HDL 45 01/14/2017   LDLCALC 62 01/14/2017   TRIG 136 01/14/2017   CHOLHDL 3.0 01/14/2017    --------------------------------------------------------------------------------------------------  ASSESSMENT AND PLAN: Coronary artery disease with stable angina No new chest pain.  Shortness of breath also improved with escalation of isosorbide mononitrate.  We will continue his current antianginal regimen consisting of isosorbide mononitrate 30 mg daily.  Chronic diastolic heart failure Other than chronic left leg edema, Mr. Delucia appears euvolemic.  Functional status is unchanged though limited by his prior stroke and residual neurologic deficits.  Paroxysmal atrial fibrillation Mr. Kishimoto appears to be maintaining sinus rhythm off amiodarone, which was held due to shortness of breath last year.  He describes no symptoms to suggest recurrence of atrial fibrillation.  EKG today  demonstrates sinus rhythm.  We will continue indefinite anticoagulation with warfarin.  Hyperlipidemia (goal LDL < 70) Mr. Maimone is currently off statin therapy, as he attributed shortness of breath to atorvastatin.  I suspect he was confusing this with amiodarone, which was previously discontinued due to dyspnea.  I have advised him to restart atorvastatin 40 mg daily.  Hypertension Blood pressure borderline elevated today.  I will not make any medication changes at this time.  Follow-up: Return to clinic in 6 months.  Nelva Bush, MD 06/08/2017 11:29 AM

## 2017-06-10 ENCOUNTER — Telehealth: Payer: Self-pay | Admitting: Internal Medicine

## 2017-06-10 NOTE — Telephone Encounter (Signed)
Spoke with patient and reminded him to take Atorvastatin 40 mg daily per Dr End on last OV.[name]  He verbalized understanding.[name]

## 2017-06-10 NOTE — Telephone Encounter (Signed)
New Message:   Pt c/o medication issue:  1. Name of Medication: atorvastatin (LIPITOR) 40 MG tablet  2. How are you currently taking this medication (dosage and times per day)? Take 1 tablet (40 mg total) by mouth daily.  3. Are you having a reaction (difficulty breathing--STAT)?no  4. What is your medication issue? Pt states dosage should be 80 mg

## 2017-06-15 NOTE — Addendum Note (Signed)
Addended by: Briant Cedar on: 06/15/2017 03:25 PM   Modules accepted: Orders

## 2017-06-29 ENCOUNTER — Ambulatory Visit: Payer: Medicare Other | Admitting: Physical Medicine & Rehabilitation

## 2017-06-29 ENCOUNTER — Encounter: Payer: Medicare Other | Attending: Physical Medicine & Rehabilitation

## 2017-06-29 DIAGNOSIS — G8111 Spastic hemiplegia affecting right dominant side: Secondary | ICD-10-CM | POA: Insufficient documentation

## 2017-06-30 ENCOUNTER — Telehealth: Payer: Self-pay | Admitting: Physical Medicine & Rehabilitation

## 2017-06-30 ENCOUNTER — Ambulatory Visit (INDEPENDENT_AMBULATORY_CARE_PROVIDER_SITE_OTHER): Payer: Medicare Other | Admitting: *Deleted

## 2017-06-30 ENCOUNTER — Other Ambulatory Visit: Payer: Self-pay | Admitting: Internal Medicine

## 2017-06-30 DIAGNOSIS — I5032 Chronic diastolic (congestive) heart failure: Secondary | ICD-10-CM

## 2017-06-30 DIAGNOSIS — I4891 Unspecified atrial fibrillation: Secondary | ICD-10-CM | POA: Diagnosis not present

## 2017-06-30 DIAGNOSIS — Z7901 Long term (current) use of anticoagulants: Secondary | ICD-10-CM | POA: Diagnosis not present

## 2017-06-30 DIAGNOSIS — I480 Paroxysmal atrial fibrillation: Secondary | ICD-10-CM

## 2017-06-30 DIAGNOSIS — I25118 Atherosclerotic heart disease of native coronary artery with other forms of angina pectoris: Secondary | ICD-10-CM

## 2017-06-30 LAB — POCT INR: INR: 1.9

## 2017-06-30 NOTE — Telephone Encounter (Signed)
Pt presented apologizing for "no showing" Botox appointment on 04/15. Pt stated "the Botox is not working for me" Pt stated he wanted to talk with Dr. Letta Pate about other options. Please advise.

## 2017-06-30 NOTE — Telephone Encounter (Signed)
Please review for a refill. Thanks! 

## 2017-06-30 NOTE — Patient Instructions (Signed)
Description   Today take an extra 1/2 tablet,  then start taking 1 tablet everyday except 1/2 tablet on Sundays and Thursdays.  Recheck INR 2 weeks. Main [phone]

## 2017-06-30 NOTE — Telephone Encounter (Signed)
Pt can come in to discuss what pt means and we can discuss other options at that time

## 2017-07-01 ENCOUNTER — Other Ambulatory Visit: Payer: Self-pay

## 2017-07-01 MED ORDER — BENAZEPRIL HCL 5 MG PO TABS: ORAL_TABLET | ORAL | 3 refills | Status: DC

## 2017-07-01 MED ORDER — BENAZEPRIL HCL 10 MG PO TABS: ORAL_TABLET | ORAL | 3 refills | Status: DC

## 2017-07-01 MED ORDER — BENAZEPRIL HCL 10 MG PO TABS: 10.0000 mg | ORAL_TABLET | Freq: Every day | ORAL | 3 refills | Status: DC

## 2017-07-01 NOTE — Telephone Encounter (Signed)
Spoke with patient and recommended that he take 1 and a half 10 mg tablet to equal 15 mg..  He replied that he wants to leave things as they are and take 1-10 mg tablet and 1- 5 mg tablet to equal 15 mg.

## 2017-07-01 NOTE — Telephone Encounter (Signed)
LVM to call and schedule an appointment 

## 2017-07-01 NOTE — Telephone Encounter (Signed)
Pt's pharmacy is requesting a refill on benazepril 10 mg tablet. The directions are take 1 tablet daily. Pt is supposed to be taking benazepril 10 mg tablet taken 1 1/2 tablet daily. The hospital prescribed benazepril 5 mg tablet as well taken 1 tablet along with the 10 mg tablet to equal 15 mgs. Please correct this medication to benazepril 10 mg tablets, pt taking 1 1/2 tablets daily to equal 15 mg total taken daily and remove the benazepril 5 mg tablet. Thank you

## 2017-07-10 ENCOUNTER — Other Ambulatory Visit: Payer: Self-pay | Admitting: Family

## 2017-07-13 ENCOUNTER — Ambulatory Visit (INDEPENDENT_AMBULATORY_CARE_PROVIDER_SITE_OTHER): Payer: Medicare Other | Admitting: Family

## 2017-07-13 ENCOUNTER — Encounter: Payer: Self-pay | Admitting: Family

## 2017-07-13 VITALS — BP 150/80 | HR 68 | Temp 98.7°F | Resp 16 | Ht 75.0 in | Wt 271.4 lb

## 2017-07-13 DIAGNOSIS — I4891 Unspecified atrial fibrillation: Secondary | ICD-10-CM | POA: Diagnosis not present

## 2017-07-13 DIAGNOSIS — E039 Hypothyroidism, unspecified: Secondary | ICD-10-CM

## 2017-07-13 DIAGNOSIS — E118 Type 2 diabetes mellitus with unspecified complications: Secondary | ICD-10-CM | POA: Diagnosis not present

## 2017-07-13 DIAGNOSIS — Z8673 Personal history of transient ischemic attack (TIA), and cerebral infarction without residual deficits: Secondary | ICD-10-CM

## 2017-07-13 DIAGNOSIS — M109 Gout, unspecified: Secondary | ICD-10-CM

## 2017-07-13 DIAGNOSIS — I25118 Atherosclerotic heart disease of native coronary artery with other forms of angina pectoris: Secondary | ICD-10-CM | POA: Diagnosis not present

## 2017-07-13 DIAGNOSIS — I10 Essential (primary) hypertension: Secondary | ICD-10-CM

## 2017-07-13 LAB — LIPID PANEL
CHOLESTEROL: 131 mg/dL (ref 0–200)
HDL: 45 mg/dL (ref >39.00)
LDL CALC: 66 mg/dL (ref 0–99)
NonHDL: 85.87
Total CHOL/HDL Ratio: 3
Triglycerides: 97 mg/dL (ref 0.0–149.0)
VLDL: 19.4 mg/dL (ref 0.0–40.0)

## 2017-07-13 LAB — COMPREHENSIVE METABOLIC PANEL
ALT: 43 U/L (ref 0–53)
AST: 35 U/L (ref 0–37)
Albumin: 3.7 g/dL (ref 3.5–5.2)
Alkaline Phosphatase: 51 U/L (ref 39–117)
BUN: 16 mg/dL (ref 6–23)
CALCIUM: 8.9 mg/dL (ref 8.4–10.5)
CHLORIDE: 107 meq/L (ref 96–112)
CO2: 28 meq/L (ref 19–32)
CREATININE: 0.96 mg/dL (ref 0.40–1.50)
GFR: 81.34 mL/min (ref >60.00)
Glucose, Bld: 147 mg/dL — ABNORMAL HIGH (ref 70–99)
POTASSIUM: 4.2 meq/L (ref 3.5–5.1)
Sodium: 141 mEq/L (ref 135–145)
Total Bilirubin: 0.5 mg/dL (ref 0.2–1.2)
Total Protein: 6.6 g/dL (ref 6.0–8.3)

## 2017-07-13 LAB — HEMOGLOBIN A1C: Hgb A1c MFr Bld: 6.7 % — ABNORMAL HIGH (ref 4.6–6.5)

## 2017-07-13 LAB — TSH: TSH: 1.29 u[IU]/mL (ref 0.35–4.50)

## 2017-07-13 MED ORDER — FUROSEMIDE 40 MG PO TABS: 40.0000 mg | ORAL_TABLET | ORAL | 1 refills | Status: DC

## 2017-07-13 NOTE — Patient Instructions (Addendum)
You may use tylenol as needed for arthritis pain.

## 2017-07-13 NOTE — Progress Notes (Signed)
Subjective:    Patient ID: [name], male    DOB: 1945/12/30, 72 y.o.   MRN: [id number]  HPI  Patient is a 72 year old male who presents today for follow-up.  He was last seen in our office back in January 2019.  Hypertension-he is maintained on furosemide, benazepril, and Lasix. BP Readings from Last 3 Encounters:  07/13/17 (!) 158/73  06/08/17 130/82  04/30/17 122/74   Hyperlipidemia- maintained on a atorvastatin 40 mg once daily. Lab Results  Component Value Date   CHOL 134 01/14/2017   HDL 45 01/14/2017   LDLCALC 62 01/14/2017   TRIG 136 01/14/2017   CHOLHDL 3.0 01/14/2017   Hypothyroid- patient is maintained on Synthroid 112 mcg once daily. Lab Results  Component Value Date   TSH 2.610 09/18/2016   Diabetes type 2-maintained on metformin, Lab Results  Component Value Date   HGBA1C 6.2 (H) 01/14/2017   HGBA1C 6.4 05/21/2016   HGBA1C 5.8 (H) 10/22/2015   Lab Results  Component Value Date   MICROALBUR <0.7 09/26/2014   LDLCALC 62 01/14/2017   CREATININE 1.26 (H) 04/29/2017   Gout- patient continues allopurinol.denies recent gout flare. Has arthritis pain in his hands though.  History of stroke-follows with Dr. Letta Pate for botox injections. Maintained on coumadin per coumadin clinic. He is also followed by neurology. (dr. Leonie Man)  AF- followed by cardiology (Dr. Saunders Revel). Has INR scheduled tomorrow in coumadin clinic.     Review of Systems See HPI  Past Medical History:  Diagnosis Date  . CAD (coronary artery disease)    s/p CABG, s/p DES to LCX  January 2011  . Chronic pain    left sided-[place]  . CVA (cerebral infarction)    left hemiparesis  . Diastolic CHF ([place])   . Dysrhythmia    hx of atrial fibrilation with cardioversion  . GERD (gastroesophageal reflux disease)   . Gout   . Hyperlipidemia   . Hypertension   . Hypothyroidism   . OSA on CPAP   . Sleep apnea   . Stroke ([place]) 1996  . Tubular adenoma of colon 05/2010     Social  History   Socioeconomic History  . Marital status: Married    Spouse name: [name]  . Number of children: Not on file  . Years of education: 59  . Highest education level: Not on file  Occupational History  . 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 Financial resource strain: Not on file  . Food insecurity:    Worry: Not on file    Inability: Not on file  . Transportation needs:    Medical: Not on file    Non-medical: Not on file  Tobacco Use  . Smoking status: Never Smoker  . Smokeless tobacco: Never Used  Substance and Sexual Activity  . Alcohol use: No    Alcohol/week: 0.0 oz    Comment: 1 beer a month  . Drug use: No  . Sexual activity: Never  Lifestyle  . Physical activity:    Days per week: Not on file    Minutes per session: Not on file  . Stress: Not on file  Relationships  . Social connections:    Talks on phone: Not on file    Gets together: Not on file    Attends religious service: Not on file    Active member of club or organization: Not on file    Attends meetings of  clubs or organizations: Not on file    Relationship status: Not on file  . Intimate partner violence:    Fear of current or ex partner: Not on file    Emotionally abused: Not on file    Physically abused: Not on file    Forced sexual activity: Not on file  Other Topics Concern  . Not on file  Social History Narrative   Pt lives with wife. Does have stairs, but patient doesn't use them. Pt has completed technical school    Past Surgical History:  Procedure Laterality Date  . CARDIAC CATHETERIZATION N/A 10/25/2015   Procedure: Left Heart Cath and Cors/Grafts Angiography;  Surgeon: Troy Sine, MD;  Location: [facility];  Service: Cardiovascular;  Laterality: N/A;  . CARDIOVERSION  06/20/2011   Procedure: CARDIOVERSION;  Surgeon: Larey Dresser, MD;  Location: [facility] ENDOSCOPY;  Service: Cardiovascular;  Laterality: N/A;  . CARDIOVERSION  N/A 04/26/2015   Procedure: CARDIOVERSION;  Surgeon: Dorothy Spark, MD;  Location: [facility] ENDOSCOPY;  Service: Cardiovascular;  Laterality: N/A;  . CARDIOVERSION N/A 05/10/2015   Procedure: CARDIOVERSION;  Surgeon: Larey Dresser, MD;  Location: [facility];  Service: Cardiovascular;  Laterality: N/A;  . CATARACT EXTRACTION  05/2010   left eye  . CATARACT EXTRACTION  04/2010   right eye  . CORONARY ARTERY BYPASS GRAFT     stent  . ESOPHAGOGASTRODUODENOSCOPY  03-25-2005  . NASAL SEPTUM SURGERY    . PERCUTANEOUS PLACEMENT INTRAVASCULAR STENT CERVICAL CAROTID ARTERY     03-2009; using a drug-eluting platform of the circumflex cornoray artery with a 3.0 x 18 [employer] Promus drug-eluting platform post dilated to 3.75 with a noncompliant balloon.  . TEE WITHOUT CARDIOVERSION  06/20/2011   Procedure: TRANSESOPHAGEAL ECHOCARDIOGRAM (TEE);  Surgeon: Larey Dresser, MD;  Location: [facility] ENDOSCOPY;  Service: Cardiovascular;  Laterality: N/A;    Family History  Problem Relation Age of Onset  . Lung cancer Father        deceased  . Stroke Mother        deceased-MINISTROKES    No Known Allergies  Current Outpatient Medications on File Prior to Visit  Medication Sig Dispense Refill  . albuterol (PROVENTIL HFA;VENTOLIN HFA) 108 (90 Base) MCG/ACT inhaler Inhale 2 puffs into the lungs every 6 (six) hours as needed for wheezing or shortness of breath. 1 Inhaler 0  . allopurinol (ZYLOPRIM) 100 MG tablet TAKE 2 TABLETS EVERY DAY 180 tablet 1  . atorvastatin (LIPITOR) 40 MG tablet Take 1 tablet (40 mg total) by mouth daily. 90 tablet 3  . benazepril (LOTENSIN) 10 MG tablet Take 1 tablet (10 mg total) by mouth daily. 90 tablet 3  . benazepril (LOTENSIN) 5 MG tablet Take 1 tablet by mouth daily 90 tablet 3  . benzonatate (TESSALON) 200 MG capsule Take 1 capsule (200 mg total) by mouth 3 (three) times daily as needed for cough. 30 capsule 0  . clindamycin (CLEOCIN) 300 MG capsule Take 1 capsule (300 mg  total) by mouth 4 (four) times daily. X 7 days 28 capsule 0  . fluticasone (FLONASE) 50 MCG/ACT nasal spray Place 2 sprays into both nostrils daily. 9.9 g 1  . furosemide (LASIX) 40 MG tablet Take 1 tablet (40 mg total) by mouth every other day. 45 tablet 1  . isosorbide mononitrate (IMDUR) 30 MG 24 hr tablet Take 0.5 tablets (15 mg total) by mouth daily. 15 tablet 11  . lactulose (CHRONULAC) 10 GM/15ML solution Take 15 mLs (  10 g total) by mouth daily as needed for mild constipation. 240 mL 1  . levothyroxine (SYNTHROID, LEVOTHROID) 112 MCG tablet Take 1 tablet (112 mcg total) by mouth daily before breakfast. 90 tablet 0  . metFORMIN (GLUCOPHAGE) 500 MG tablet Take 1 tablet (500 mg total) daily with breakfast by mouth. 90 tablet 1  . Multiple Vitamins-Minerals (MULTIVITAMIN GUMMIES MENS PO) Take 1 tablet by mouth daily with breakfast.     . nitroGLYCERIN (NITROSTAT) 0.4 MG SL tablet Place 1 tablet (0.4 mg total) under the tongue every 5 (five) minutes as needed for chest pain. Up to 3 doses 10 tablet 2  . OXYGEN Inhale 2 L into the lungs at bedtime.    . pantoprazole (PROTONIX) 40 MG tablet Take 1 tablet (40 mg total) every morning by mouth. 90 tablet 1  . traMADol (ULTRAM) 50 MG tablet Take 1 tablet (50 mg total) by mouth 2 (two) times daily. 180 tablet 0  . traMADol (ULTRAM-ER) 200 MG 24 hr tablet Take 1 tablet (200 mg total) by mouth daily. 90 tablet 0  . vitamin B-12 1000 MCG tablet Take 1 tablet (1,000 mcg total) by mouth daily. 30 tablet 0  . warfarin (COUMADIN) 2.5 MG tablet Take as directed by coumadin clinic 90 tablet 0   No current facility-administered medications on file prior to visit.     BP (!) 158/73 (BP Location: Left Arm, Patient Position: Sitting, Cuff Size: Large)   Pulse 68   Temp 98.7 F (37.1 C) (Oral)   Resp 16   Ht 6\' 3"  (1.905 m)   Wt 271 lb 6.4 oz (123.1 kg)   SpO2 96%   BMI 33.92 kg/m       Objective:   Physical Exam  Constitutional: He is oriented to  person, place, and time. He appears well-developed and well-nourished. No distress.  HENT:  Head: Normocephalic and atraumatic.  Cardiovascular: Normal rate and regular rhythm.  No murmur heard. Pulmonary/Chest: Effort normal and breath sounds normal. No respiratory distress. He has no wheezes. He has no rales.  Musculoskeletal: He exhibits no edema.  Neurological: He is alert and oriented to person, place, and time.  Skin: Skin is warm and dry.  Psychiatric: His behavior is normal. Thought content normal.  Irritable/flat affect          Assessment & Plan:  HTN-blood pressure is up slightly.  I suspect is because he is been out of his Lasix for the last few weeks.  He is advised to resume Lasix.  Refill was sent.  Diabetes type 2- clinically stable, continue metformin.  Obtain follow-up A1c.  Gout-clinically stable on allopurinol.  Continue same.  Atrial fibrillation-rate stable continue his Coumadin.  Has INR check in Coumadin clinic scheduled for tomorrow.  History of stroke-patient at baseline.  Continues to follow with neurology and for Botox injections.

## 2017-07-14 ENCOUNTER — Ambulatory Visit (INDEPENDENT_AMBULATORY_CARE_PROVIDER_SITE_OTHER): Payer: Medicare Other

## 2017-07-14 DIAGNOSIS — I4891 Unspecified atrial fibrillation: Secondary | ICD-10-CM

## 2017-07-14 DIAGNOSIS — Z7901 Long term (current) use of anticoagulants: Secondary | ICD-10-CM | POA: Diagnosis not present

## 2017-07-14 LAB — POCT INR: INR: 2.6

## 2017-07-14 NOTE — Patient Instructions (Signed)
Description   Continue on same dosage 1 tablet everyday except 1/2 tablet on Sundays and Thursdays.  Recheck INR 3 weeks. Main [phone]

## 2017-07-23 ENCOUNTER — Other Ambulatory Visit: Payer: Self-pay | Admitting: Family

## 2017-07-28 ENCOUNTER — Encounter: Payer: Medicare Other | Attending: Physical Medicine & Rehabilitation

## 2017-07-28 ENCOUNTER — Other Ambulatory Visit: Payer: Self-pay

## 2017-07-28 ENCOUNTER — Ambulatory Visit (HOSPITAL_BASED_OUTPATIENT_CLINIC_OR_DEPARTMENT_OTHER): Payer: Medicare Other | Admitting: Physical Medicine & Rehabilitation

## 2017-07-28 ENCOUNTER — Encounter: Payer: Self-pay | Admitting: Physical Medicine & Rehabilitation

## 2017-07-28 VITALS — BP 155/91 | HR 65 | Ht 75.0 in | Wt 267.6 lb

## 2017-07-28 DIAGNOSIS — R252 Cramp and spasm: Secondary | ICD-10-CM | POA: Insufficient documentation

## 2017-07-28 DIAGNOSIS — G8110 Spastic hemiplegia affecting unspecified side: Secondary | ICD-10-CM

## 2017-07-28 NOTE — Progress Notes (Signed)
Botox Injection for spasticity using needle EMG guidance  Dilution: 50 Units/ml Indication: Severe spasticity which interferes with ADL,mobility and/or  hygiene and is unresponsive to medication management and other conservative care Informed consent was obtained after describing risks and benefits of the procedure with the patient. This includes bleeding, bruising, infection, excessive weakness, or medication side effects. A REMS form is on file and signed. Needle: 27g 1" needle electrode Number of units per muscle  FDS 50U [id number]   [id number]  Consider adding FDP All injections were done after obtaining appropriate EMG activity and after negative drawback for blood. The patient tolerated the procedure well. Post procedure instructions were given. A followup appointment was made.

## 2017-07-28 NOTE — Patient Instructions (Signed)

## 2017-08-04 ENCOUNTER — Ambulatory Visit (INDEPENDENT_AMBULATORY_CARE_PROVIDER_SITE_OTHER): Payer: Medicare Other | Admitting: *Deleted

## 2017-08-04 DIAGNOSIS — Z5181 Encounter for therapeutic drug level monitoring: Secondary | ICD-10-CM | POA: Diagnosis not present

## 2017-08-04 DIAGNOSIS — I6930 Unspecified sequelae of cerebral infarction: Secondary | ICD-10-CM | POA: Diagnosis not present

## 2017-08-04 DIAGNOSIS — I480 Paroxysmal atrial fibrillation: Secondary | ICD-10-CM

## 2017-08-04 DIAGNOSIS — Z7901 Long term (current) use of anticoagulants: Secondary | ICD-10-CM

## 2017-08-04 DIAGNOSIS — I4891 Unspecified atrial fibrillation: Secondary | ICD-10-CM | POA: Diagnosis not present

## 2017-08-04 LAB — POCT INR: INR: 2.6 (ref 2.0–3.0)

## 2017-08-04 NOTE — Patient Instructions (Signed)
Description   Continue on same dosage 1 tablet everyday except 1/2 tablet on Sundays and Thursdays.  Recheck INR 4 weeks. Main [phone]

## 2017-09-02 ENCOUNTER — Other Ambulatory Visit: Payer: Self-pay | Admitting: Family

## 2017-09-03 ENCOUNTER — Ambulatory Visit (INDEPENDENT_AMBULATORY_CARE_PROVIDER_SITE_OTHER): Payer: Medicare Other | Admitting: *Deleted

## 2017-09-03 DIAGNOSIS — I6930 Unspecified sequelae of cerebral infarction: Secondary | ICD-10-CM | POA: Diagnosis not present

## 2017-09-03 DIAGNOSIS — I481 Persistent atrial fibrillation: Secondary | ICD-10-CM | POA: Diagnosis not present

## 2017-09-03 DIAGNOSIS — I4891 Unspecified atrial fibrillation: Secondary | ICD-10-CM

## 2017-09-03 DIAGNOSIS — I4819 Other persistent atrial fibrillation: Secondary | ICD-10-CM

## 2017-09-03 DIAGNOSIS — I480 Paroxysmal atrial fibrillation: Secondary | ICD-10-CM

## 2017-09-03 DIAGNOSIS — Z5181 Encounter for therapeutic drug level monitoring: Secondary | ICD-10-CM | POA: Diagnosis not present

## 2017-09-03 DIAGNOSIS — Z7901 Long term (current) use of anticoagulants: Secondary | ICD-10-CM

## 2017-09-03 LAB — POCT INR: INR: 4.1 — AB (ref 2.0–3.0)

## 2017-09-03 NOTE — Patient Instructions (Addendum)
  Description   Do not take coumadin tomorrow June 21st as he has already taken coumadin today then continue on same dosage 1 tablet everyday except 1/2 tablet on Sundays and Thursdays.  Recheck INR 2 weeks. Main [phone] Coumadin clinic [phone]

## 2017-09-04 ENCOUNTER — Encounter: Payer: Medicare Other | Attending: Physical Medicine & Rehabilitation

## 2017-09-04 ENCOUNTER — Ambulatory Visit: Payer: Medicare Other | Admitting: Physical Medicine & Rehabilitation

## 2017-09-04 DIAGNOSIS — G8111 Spastic hemiplegia affecting right dominant side: Secondary | ICD-10-CM | POA: Insufficient documentation

## 2017-09-07 ENCOUNTER — Other Ambulatory Visit: Payer: Self-pay | Admitting: Internal Medicine

## 2017-09-07 NOTE — Telephone Encounter (Signed)
Refill Request.  

## 2017-09-11 ENCOUNTER — Encounter: Payer: Self-pay | Admitting: Internal Medicine

## 2017-09-11 ENCOUNTER — Ambulatory Visit (INDEPENDENT_AMBULATORY_CARE_PROVIDER_SITE_OTHER): Payer: Medicare Other | Admitting: Internal Medicine

## 2017-09-11 VITALS — BP 148/76 | HR 75 | Ht 75.0 in | Wt 266.0 lb

## 2017-09-11 DIAGNOSIS — R202 Paresthesia of skin: Secondary | ICD-10-CM

## 2017-09-11 DIAGNOSIS — I10 Essential (primary) hypertension: Secondary | ICD-10-CM

## 2017-09-11 DIAGNOSIS — E785 Hyperlipidemia, unspecified: Secondary | ICD-10-CM

## 2017-09-11 DIAGNOSIS — I25118 Atherosclerotic heart disease of native coronary artery with other forms of angina pectoris: Secondary | ICD-10-CM

## 2017-09-11 DIAGNOSIS — I480 Paroxysmal atrial fibrillation: Secondary | ICD-10-CM | POA: Diagnosis not present

## 2017-09-11 DIAGNOSIS — I5032 Chronic diastolic (congestive) heart failure: Secondary | ICD-10-CM

## 2017-09-11 NOTE — Progress Notes (Signed)
Follow-up Outpatient Visit Date: 09/11/2017  Primary Care Provider: Debbrah Alar, NP [address] [place] [id number]  Chief Complaint: Hand and arm numbness  HPI:  [name] is a 72 y.o. year-old male with history of coronary artery disease status post CABG, chronic diastolic heart failure, paroxysmal atrial fibrillation, stroke, hypertension, hyperlipidemia, and obstructive sleep apnea, who presents for follow-up of coronary artery disease, heart failure, and atrial fibrillation.  I last saw March, at which time he reported feeling better after escalation of isosorbide mononitrate.  Last week, he mentioned intermittent paresthesias in his arms during INR check.  He was concerned that this may reflect an anginal equivalent for him and therefore presents for urgent follow-up today.  Today, Mr. [name] reports that he has had numbness and a cold feeling in both arms for several months.  It seems to be most pronounced when he is sitting still or driving.  It is not positional.  He denies pain in his arms as well as chest pain, shortness of breath, edema, and palpitations.  He has noted that his strength seems to be a little worse compared with last year.  He also finds it challenging to walk, stating that it is sometimes hard for him to initiate movements.  He has mentioned some of these complaints to his neurologist but feels like a clear explanation has he yet to be found.  He remains compliant with his medications, including warfarin.  He notes occasional lightheadedness but has not fallen.  He ambulates with a walker.  --------------------------------------------------------------------------------------------------  Cardiovascular History & Procedures: Cardiovascular Problems:  Coronary artery disease  Paroxysmal atrial fibrillation  Chronic diastolic heart failure  Stroke with residual left-sided weakness  Risk Factors:  Known CAD, stroke, hypertension,  and hyperlipidemia  Cath/PCI:  LHC (10/25/15): LMCA normal. LAD with 90% ostial stenosis with competitive flow in the midvessel. D1 ostially occluded. LCx with 95% ostial stenosis. 30% mid LCx ISR. Ostially occluded OM2. Small, non-dominant RCA that is chronically occluded. LIMA->LAD patent with 95% stenosis in the apical LAD distal to the anastomosis. Patent SVG Y-graft to D1 and OM2. SVG->PDA known to be occluded.  CV Surgery:  CABG (LIMA->LAD, SVG Y-graft ->D1 and OM2, and SVG->PDA)  EP Procedures and Devices:  None  Non-Invasive Evaluation(s):  Pharmacologic MPI ( 05/07/17): Low risk study with small inferolateral defect consistent with attenuation artifact and/or subendocardial scar. No ischemia. LVEF 53%.  TTE (04/26/15): Normal LV size with mild LVH. LVEF 50-55% with normal wall motion and diastolic function. Mildly thickened aortic valve. Mild left atrial enlargement. Moderate TR. Normal RV size and function.   Recent CV Pertinent Labs: Lab Results  Component Value Date   CHOL 131 07/13/2017   CHOL 138 12/05/2016   HDL 45.00 07/13/2017   HDL 46 12/05/2016   LDLCALC 66 07/13/2017   LDLCALC 67 12/05/2016   TRIG 97.0 07/13/2017   CHOLHDL 3 07/13/2017   INR 4.1 (A) 09/03/2017   INR 1.75 01/13/2017   BNP 111.5 (H) 01/13/2017   BNP 82.2 11/05/2015   K 4.2 07/13/2017   MG 2.1 10/22/2015   BUN 16 07/13/2017   BUN 14 09/18/2016   CREATININE 0.96 07/13/2017   CREATININE 1.27 (H) 11/05/2015    Past medical and surgical history were reviewed and updated in EPIC.  Current Meds  Medication Sig  . allopurinol (ZYLOPRIM) 100 MG tablet Take 200 mg by mouth daily.  Marland Kitchen atorvastatin (LIPITOR) 40 MG tablet Take 1 tablet (40 mg  total) by mouth daily.  . benazepril (LOTENSIN) 10 MG tablet Take 1 tablet (10 mg total) by mouth daily.  . benazepril (LOTENSIN) 5 MG tablet TAKE 1 TABLET BY MOUTH DAILY WITH 10MG  OF BENAZEPRIL TO EQUAL 15MG  DAILY  . fluticasone (FLONASE) 50 MCG/ACT nasal  spray Place 2 sprays into both nostrils daily.  . furosemide (LASIX) 40 MG tablet Take 1 tablet (40 mg total) by mouth every other day.  . isosorbide mononitrate (IMDUR) 30 MG 24 hr tablet Take 0.5 tablets (15 mg total) by mouth daily.  Marland Kitchen lactulose (CHRONULAC) 10 GM/15ML solution Take 15 mLs (10 g total) by mouth daily as needed for mild constipation.  Marland Kitchen levothyroxine (SYNTHROID, LEVOTHROID) 112 MCG tablet TAKE 1 TABLET (112 MCG TOTAL) BY MOUTH DAILY BEFORE BREAKFAST.  . metFORMIN (GLUCOPHAGE) 500 MG tablet TAKE 1 TABLET (500 MG TOTAL) DAILY WITH BREAKFAST BY MOUTH.  . Multiple Vitamins-Minerals (MULTIVITAMIN GUMMIES MENS PO) Take 1 tablet by mouth daily with breakfast.   . nitroGLYCERIN (NITROSTAT) 0.4 MG SL tablet Place 1 tablet (0.4 mg total) under the tongue every 5 (five) minutes as needed for chest pain. Up to 3 doses  . OXYGEN Inhale 2 L into the lungs at bedtime.  . pantoprazole (PROTONIX) 40 MG tablet Take 1 tablet (40 mg total) every morning by mouth.  . traMADol (ULTRAM) 50 MG tablet Take 1 tablet (50 mg total) by mouth 2 (two) times daily.  . traMADol (ULTRAM-ER) 200 MG 24 hr tablet Take 1 tablet (200 mg total) by mouth daily.  . vitamin B-12 1000 MCG tablet Take 1 tablet (1,000 mcg total) by mouth daily.  Marland Kitchen warfarin (COUMADIN) 2.5 MG tablet Take as directed by coumadin clinic    Allergies: Patient has no known allergies.  Social History   Tobacco Use  . Smoking status: Never Smoker  . Smokeless tobacco: Never Used  Substance Use Topics  . Alcohol use: No    Alcohol/week: 0.0 oz    Comment: 1 beer a month  . Drug use: No    Family History  Problem Relation Age of Onset  . Lung cancer Father        deceased  . Stroke Mother        deceased-MINISTROKES    Review of Systems: A 12-system review of systems was performed and was negative except as noted in the  HPI.  --------------------------------------------------------------------------------------------------  Physical Exam: BP (!) 148/76   Pulse 75   Ht 6\' 3"  (1.905 m)   Wt 266 lb (120.7 kg)   SpO2 95%   BMI 33.25 kg/m   General: NAD. HEENT: No conjunctival pallor or scleral icterus. Moist mucous membranes.  OP clear. Neck: Supple without lymphadenopathy, thyromegaly, JVD, or HJR. No carotid bruit. Lungs: Normal work of breathing. Clear to auscultation bilaterally without wheezes or crackles. Heart: Regular rate and rhythm with occasional extrasystoles.  No murmurs, rubs, or gallops.  Nondisplaced PMI. Abd: Bowel sounds present. Soft, NT/ND without hepatosplenomegaly Ext: No lower extremity edema. Radial, PT, and DP pulses are 2+ bilaterally. Neuro: Normal fine touch sensation throughout.  5/5 upper extremity strength.  4/5 left hip flexion.  5/5 right hip flexion. Skin: Warm and dry without rash.  EKG:  NSR with PAC's, left axis deviation, borderline LVH, and non-specific T-wave changes.  PAC's are new; otherwise, no significant change since 05/2517.  Lab Results  Component Value Date   WBC 7.2 04/29/2017   HGB 13.7 04/29/2017   HCT 43.0 04/29/2017   MCV 91.9  04/29/2017   PLT 202 04/29/2017    Lab Results  Component Value Date   NA 141 07/13/2017   K 4.2 07/13/2017   CL 107 07/13/2017   CO2 28 07/13/2017   BUN 16 07/13/2017   CREATININE 0.96 07/13/2017   GLUCOSE 147 (H) 07/13/2017   ALT 43 07/13/2017    Lab Results  Component Value Date   CHOL 131 07/13/2017   HDL 45.00 07/13/2017   LDLCALC 66 07/13/2017   TRIG 97.0 07/13/2017   CHOLHDL 3 07/13/2017    --------------------------------------------------------------------------------------------------  ASSESSMENT AND PLAN: Arm paresthesias I suspect this is a primary neurologic problem, either a neuropathy, radiculopathy, or sequela of prior stroke.  Given that Mr. Clasby notes occasional cool sensation in his  hand and is very concerned that circulation is causing his symptoms, we have agreed to perform carotid and bilateral upper extremity Dopplers to evaluate for significant disease.  Of note, his radial pulses bilaterally are brisk.  He brings up the possibility of this representing an anginal equivalent.  I think it is highly unlikely that this represents angina, particularly given low risk stress test earlier this year and catheterization in 2017 showing patent bypass grafts.  I recommended against proceeding with cardiac catheterization at this time.  I encouraged him to speak with his neurologist about the symptoms.  Chronic diastolic heart failure Mr. Zylstra appears euvolemic.  Continue every other day furosemide.  Coronary artery disease with stable angina No clear angina.  Continue secondary prevention as well as isosorbide mononitrate for antianginal therapy.  Paroxysmal atrial fibrillation Sinus rhythm today with occasional PACs.  Continue indefinite anticoagulation given history of stroke and CHADSVASC score of at least 5.  Hypertension Blood pressure suboptimally controlled.  We will defer medication changes for now pending vascular assessment.  Hyperlipidemia LDL at goal.  Continue atorvastatin 40 mg daily.  Follow-up: Return to clinic in 1 month.  Nelva Bush, MD 09/11/2017 8:39 AM

## 2017-09-11 NOTE — Patient Instructions (Addendum)
Medication Instructions:  Your physician recommends that you continue on your current medications as directed. Please refer to the Current Medication list given to you today.  -- If you need a refill on your cardiac medications before your next appointment, please call your pharmacy. --  Labwork: none  Testing/Procedures:CAROTID DOPPLERS/ UPPER EXTREMITIES Your physician has requested that you have a carotid duplex. This test is an ultrasound of the carotid arteries in your neck. It looks at blood flow through these arteries that supply the brain with blood. Allow one hour for this exam. There are no restrictions or special instructions. Your physician has requested that you have a lower or upper extremity arterial duplex. This test is an ultrasound of the arteries in the legs or arms. It looks at arterial blood flow in the legs and arms. Allow one hour for Lower and Upper Arterial scans. There are no restrictions or special instructions  PLEASE SCHEDULE, THANK YOU 1-2 weeks  Follow-Up: Your physician wants you to follow-up in: 1 MONTH with APP    Thank you for choosing [facility]!!    Any Other Special Instructions Will Be Listed Below (If Applicable).

## 2017-09-14 ENCOUNTER — Ambulatory Visit (INDEPENDENT_AMBULATORY_CARE_PROVIDER_SITE_OTHER): Payer: Medicare Other | Admitting: Pharmacist

## 2017-09-14 DIAGNOSIS — Z7901 Long term (current) use of anticoagulants: Secondary | ICD-10-CM

## 2017-09-14 LAB — POCT INR: INR: 3.5 — AB (ref 2.0–3.0)

## 2017-09-14 NOTE — Patient Instructions (Signed)
Description   Skip your Coumadin today, then continue on same dosage 1 tablet everyday except 1/2 tablet on Sundays and Thursdays. Increase your greens back to your normal amount. Recheck INR 2 weeks. Main [phone] Coumadin clinic [phone]

## 2017-09-21 ENCOUNTER — Encounter: Payer: Self-pay | Admitting: Physical Medicine & Rehabilitation

## 2017-09-21 ENCOUNTER — Ambulatory Visit (HOSPITAL_BASED_OUTPATIENT_CLINIC_OR_DEPARTMENT_OTHER): Payer: Medicare Other | Admitting: Physical Medicine & Rehabilitation

## 2017-09-21 ENCOUNTER — Encounter: Payer: Medicare Other | Attending: Physical Medicine & Rehabilitation

## 2017-09-21 ENCOUNTER — Encounter (HOSPITAL_COMMUNITY): Payer: Medicare Other

## 2017-09-21 VITALS — BP 159/90 | HR 67 | Ht 75.0 in | Wt 266.0 lb

## 2017-09-21 DIAGNOSIS — I25118 Atherosclerotic heart disease of native coronary artery with other forms of angina pectoris: Secondary | ICD-10-CM

## 2017-09-21 DIAGNOSIS — G8110 Spastic hemiplegia affecting unspecified side: Secondary | ICD-10-CM | POA: Diagnosis not present

## 2017-09-21 DIAGNOSIS — G8111 Spastic hemiplegia affecting right dominant side: Secondary | ICD-10-CM | POA: Diagnosis not present

## 2017-09-21 NOTE — Progress Notes (Signed)
Subjective:    Patient ID: [name], male    DOB: [birth year], 72 y.o.   MRN: [id number] 72 year old male with past medical history of right CVA causing spastic left hemiplegia. He has been receiving Botox injections for left hamstring spasticity as well as left upper extremity spasticity Patient had likely embolic infarct with history of atrial fibrillation on chronic Coumadin. HPI Last Botox injection 07/28/2017 FDS 50U FCR50U   Hamstrings200 patient feels that the results for the lower extremity injection  Was better than the upper extremity injection.  He still notes some weakness in his hand.  We discussed that the Botox will not improve strength but merely reduce excessive muscle tone.  Pain Inventory Average Pain 8 Pain Right Now 8 My pain is constant and aching  In the last 24 hours, has pain interfered with the following? General activity 0 Relation with others 0 Enjoyment of life 8 What TIME of day is your pain at its worst? morning Sleep (in general) Poor  Pain is worse with: walking and standing Pain improves with: medication Relief from Meds: 7  Mobility walk with assistance use a walker how many minutes can you walk? 20 ability to climb steps?  yes do you drive?  yes Do you have any goals in this area?  no  Function retired Do you have any goals in this area?  no  Neuro/Psych trouble walking  Prior Studies Any changes since last visit?  no  Physicians involved in your care Any changes since last visit?  no   Family History  Problem Relation Age of Onset  . Lung cancer Father        deceased  . Stroke Mother        deceased-MINISTROKES   Social History   Socioeconomic History  . Marital status: Married    Spouse name: [name]  . Number of children: Not on file  . Years of education: 57  . Highest education level: Not on file  Occupational History  . 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 Financial resource strain: Not on file  . Food insecurity:    Worry: Not on file    Inability: Not on file  . Transportation needs:    Medical: Not on file    Non-medical: Not on file  Tobacco Use  . Smoking status: Never Smoker  . Smokeless tobacco: Never Used  Substance and Sexual Activity  . Alcohol use: No    Alcohol/week: 0.0 oz    Comment: 1 beer a month  . Drug use: No  . Sexual activity: Never  Lifestyle  . Physical activity:    Days per week: Not on file    Minutes per session: Not on file  . Stress: Not on file  Relationships  . Social connections:    Talks on phone: Not on file    Gets together: Not on file    Attends religious service: Not on file    Active member of club or organization: Not on file    Attends meetings of clubs or organizations: Not on file    Relationship status: Not on file  Other Topics Concern  . Not on file  Social History Narrative   Pt lives with wife. Does have stairs, but patient doesn't use them. Pt has completed technical school   Past Surgical History:  Procedure Laterality Date  . CARDIAC CATHETERIZATION N/A 10/25/2015   Procedure:  Left Heart Cath and Cors/Grafts Angiography;  Surgeon: Troy Sine, MD;  Location: [place] [facility];  Service: Cardiovascular;  Laterality: N/A;  . CARDIOVERSION  06/20/2011   Procedure: CARDIOVERSION;  Surgeon: Larey Dresser, MD;  Location: [facility] ENDOSCOPY;  Service: Cardiovascular;  Laterality: N/A;  . CARDIOVERSION N/A 04/26/2015   Procedure: CARDIOVERSION;  Surgeon: Dorothy Spark, MD;  Location: [facility] ENDOSCOPY;  Service: Cardiovascular;  Laterality: N/A;  . CARDIOVERSION N/A 05/10/2015   Procedure: CARDIOVERSION;  Surgeon: Larey Dresser, MD;  Location: [place];  Service: Cardiovascular;  Laterality: N/A;  . CATARACT EXTRACTION  05/2010   left eye  . CATARACT EXTRACTION  04/2010   right eye  . CORONARY ARTERY BYPASS GRAFT     stent  . ESOPHAGOGASTRODUODENOSCOPY  03-25-2005  .  NASAL SEPTUM SURGERY    . PERCUTANEOUS PLACEMENT INTRAVASCULAR STENT CERVICAL CAROTID ARTERY     03-2009; using a drug-eluting platform of the circumflex cornoray artery with a 3.0 x 18 [employer] Promus drug-eluting platform post dilated to 3.75 with a noncompliant balloon.  . TEE WITHOUT CARDIOVERSION  06/20/2011   Procedure: TRANSESOPHAGEAL ECHOCARDIOGRAM (TEE);  Surgeon: Larey Dresser, MD;  Location: [place];  Service: Cardiovascular;  Laterality: N/A;   Past Medical History:  Diagnosis Date  . CAD (coronary artery disease)    s/p CABG, s/p DES to LCX  January 2011  . Chronic pain    left sided-Kristeins  . CVA (cerebral infarction)    left hemiparesis  . Diastolic CHF ([place])   . Dysrhythmia    hx of atrial fibrilation with cardioversion  . GERD (gastroesophageal reflux disease)   . Gout   . Hyperlipidemia   . Hypertension   . Hypothyroidism   . OSA on CPAP   . Sleep apnea   . Stroke ([place]) 1996  . Tubular adenoma of colon 05/2010   BP (!) 159/90 (BP Location: Right Arm, Patient Position: Sitting, Cuff Size: Large)   Pulse 67   Ht 6\' 3"  (1.905 m)   Wt 266 lb (120.7 kg)   SpO2 94%   BMI 33.25 kg/m   Opioid Risk Score:   Fall Risk Score:  `1  Depression screen PHQ 2/9  Depression screen [facility] 2/9 07/28/2017 07/13/2017 05/21/2016 03/05/2015 01/01/2015 06/06/2014 03/24/2014  Decreased Interest 0 0 0 0 3 0 0  Down, Depressed, Hopeless 0 0 0 0 0 0 0  PHQ - 2 Score 0 0 0 0 3 0 0  Altered sleeping - - 1 - 0 2 -  Tired, decreased energy - - 1 - 1 0 -  Change in appetite - - 0 - 0 0 -  Feeling bad or failure about yourself  - - 0 - 0 0 -  Trouble concentrating - - 0 - 0 0 -  Moving slowly or fidgety/restless - - 0 - 0 1 -  Suicidal thoughts - - 0 - 0 0 -  PHQ-9 Score - - 2 - 4 3 -  Difficult doing work/chores - - - - Not difficult at all - -  Some recent data might be hidden    Review of Systems  Constitutional: Negative.   HENT: Negative.   Eyes: Negative.     Respiratory: Negative.   Cardiovascular: Negative.   Gastrointestinal: Negative.   Endocrine: Negative.   Genitourinary: Negative.   Musculoskeletal: Positive for back pain and gait problem.  Skin: Negative.   Allergic/Immunologic: Negative.   Hematological: Negative.   Psychiatric/Behavioral: Negative.  Objective:   Physical Exam  Constitutional: He is oriented to person, place, and time. He appears well-developed and well-nourished. No distress.  HENT:  Head: Normocephalic and atraumatic.  Eyes: Pupils are equal, round, and reactive to light. EOM are normal.  Neck: Normal range of motion.  Musculoskeletal: Normal range of motion.  Neurological: He is alert and oriented to person, place, and time.  Skin: Skin is warm and dry. He is not diaphoretic.  Psychiatric: He has a normal mood and affect.  Nursing note and vitals reviewed.   Motor strength is 3- in the left deltoid 4 at the left bicep tricep 4- at the finger flexors and 3- at the finger extensors. Left lower extremity has 4 strength at the hip flexors 4+ knee extensors and 4- ankle dorsiflexors. Tone no evidence of clonus at the ankle There is MAS to tone at the hamstrings MAS 1 in the finger flexors. No evidence of clonus      Assessment & Plan:   #1.  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  Patient follows up with cardiology. We reviewed purpose of botulinum toxin and the fact that he cannot build muscle strength.  We discussed returning to physical therapy which he declines. We will continue current dosing in the hamstrings 200 units Will change upper extremity to FDS 25 units, FDP 25 units,  continue FCR 50 units

## 2017-09-21 NOTE — Patient Instructions (Signed)
Keep Botox at same dose

## 2017-09-25 ENCOUNTER — Other Ambulatory Visit: Payer: Self-pay | Admitting: Internal Medicine

## 2017-09-25 ENCOUNTER — Ambulatory Visit (HOSPITAL_COMMUNITY)
Admission: RE | Admit: 2017-09-25 | Payer: Medicare Other | Source: Ambulatory Visit | Attending: Internal Medicine | Admitting: Internal Medicine

## 2017-09-25 DIAGNOSIS — R202 Paresthesia of skin: Secondary | ICD-10-CM

## 2017-09-25 DIAGNOSIS — I6523 Occlusion and stenosis of bilateral carotid arteries: Secondary | ICD-10-CM

## 2017-10-01 ENCOUNTER — Ambulatory Visit (HOSPITAL_COMMUNITY)
Admission: RE | Admit: 2017-10-01 | Discharge: 2017-10-01 | Disposition: A | Discharge status: Home / Self Care | Payer: Medicare Other | Source: Ambulatory Visit | Attending: Cardiology | Admitting: Cardiology

## 2017-10-01 DIAGNOSIS — I6523 Occlusion and stenosis of bilateral carotid arteries: Secondary | ICD-10-CM | POA: Diagnosis not present

## 2017-10-01 DIAGNOSIS — R202 Paresthesia of skin: Secondary | ICD-10-CM | POA: Insufficient documentation

## 2017-10-12 ENCOUNTER — Ambulatory Visit (INDEPENDENT_AMBULATORY_CARE_PROVIDER_SITE_OTHER): Payer: Medicare Other | Admitting: Family

## 2017-10-12 ENCOUNTER — Encounter: Payer: Self-pay | Admitting: Family

## 2017-10-12 VITALS — BP 146/75 | HR 65 | Temp 98.3°F | Resp 16 | Ht 75.0 in | Wt 265.2 lb

## 2017-10-12 DIAGNOSIS — K219 Gastro-esophageal reflux disease without esophagitis: Secondary | ICD-10-CM

## 2017-10-12 DIAGNOSIS — Z8673 Personal history of transient ischemic attack (TIA), and cerebral infarction without residual deficits: Secondary | ICD-10-CM

## 2017-10-12 DIAGNOSIS — E118 Type 2 diabetes mellitus with unspecified complications: Secondary | ICD-10-CM

## 2017-10-12 DIAGNOSIS — I6523 Occlusion and stenosis of bilateral carotid arteries: Secondary | ICD-10-CM | POA: Diagnosis not present

## 2017-10-12 DIAGNOSIS — M545 Low back pain, unspecified: Secondary | ICD-10-CM

## 2017-10-12 DIAGNOSIS — E039 Hypothyroidism, unspecified: Secondary | ICD-10-CM

## 2017-10-12 DIAGNOSIS — M109 Gout, unspecified: Secondary | ICD-10-CM

## 2017-10-12 DIAGNOSIS — I10 Essential (primary) hypertension: Secondary | ICD-10-CM | POA: Diagnosis not present

## 2017-10-12 LAB — BASIC METABOLIC PANEL
BUN: 19 mg/dL (ref 6–23)
CHLORIDE: 103 meq/L (ref 96–112)
CO2: 28 meq/L (ref 19–32)
CREATININE: 1.06 mg/dL (ref 0.40–1.50)
Calcium: 9.2 mg/dL (ref 8.4–10.5)
GFR: 72.5 mL/min (ref >60.00)
Glucose, Bld: 124 mg/dL — ABNORMAL HIGH (ref 70–99)
POTASSIUM: 4 meq/L (ref 3.5–5.1)
Sodium: 141 mEq/L (ref 135–145)

## 2017-10-12 LAB — HEMOGLOBIN A1C: Hgb A1c MFr Bld: 6.9 % — ABNORMAL HIGH (ref 4.6–6.5)

## 2017-10-12 NOTE — Progress Notes (Signed)
Subjective:    Patient ID: [name], male    DOB: [birth year], 72 y.o.   MRN: [id number]  HPI  Patient is a 72 yr old male who presents today for follow up:  HTN-  BP Readings from Last 3 Encounters:  10/12/17 (!) 146/75  09/21/17 (!) 159/90  09/11/17 (!) 148/76   DM2- not checking sugars.  Feels like he is eating the wrong foods.  Wt Readings from Last 3 Encounters:  10/12/17 265 lb 3.2 oz (120.3 kg)  09/21/17 266 lb (120.7 kg)  09/11/17 266 lb (120.7 kg)    Lab Results  Component Value Date   HGBA1C 6.7 (H) 07/13/2017   HGBA1C 6.2 (H) 01/14/2017   HGBA1C 6.4 05/21/2016   Lab Results  Component Value Date   MICROALBUR <0.7 09/26/2014   LDLCALC 66 07/13/2017   CREATININE 0.96 07/13/2017   Hypothyroid- continues synthroid.  Lab Results  Component Value Date   TSH 1.29 07/13/2017   Hyperlipidemia-  Lab Results  Component Value Date   CHOL 131 07/13/2017   HDL 45.00 07/13/2017   LDLCALC 66 07/13/2017   TRIG 97.0 07/13/2017   CHOLHDL 3 07/13/2017   Gout- maintained on allopurinol. No gout flares.    GERD- maintained on protonix.  Reports that his symptoms are well controlled.    Review of Systems   See HPI  Past Medical History:  Diagnosis Date  . Acute encephalopathy 12/12/2015  . Acute respiratory failure with hypoxemia ([place]) 12/12/2015  . Acute respiratory failure with hypoxia ([place]) 06/17/2011  . Acute respiratory failure with hypoxia and hypercapnia ([place]) 12/12/2015  . Allergic rhinitis 02/13/2010   Qualifier: Diagnosis of  By: Wynona Luna   . Angina at rest [facility]) 10/22/2015  . Ataxia 08/10/2013  . Atrial fibrillation with RVR ([place]) 06/17/2011  . B12 deficiency 08/11/2013  . BACK PAIN, LUMBAR 01/29/2009   Qualifier: Diagnosis of  By: Wynona Luna   . BENIGN POSITIONAL VERTIGO 11/23/2009   Qualifier: Diagnosis of  By: Wynona Luna   . CAD (coronary artery disease)    s/p CABG, s/p DES to LCX  January 2011  . CAD S/P CFX DES 2011  04/25/2015  . CALLUS, LEFT FOOT 03/21/2008   Qualifier: Diagnosis of  By: Wynona Luna   . Chest pain 04/24/2015  . Chronic diastolic heart failure ([place]) 04/24/2015  . Chronic pain    left sided-Kristeins  . Constipation 04/24/2015  . Coronary 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 ([place]) 11/04/2015  . CVA (cerebral infarction)    left hemiparesis  . Dark urine 04/10/2011  . Diastolic CHF ([place])   . Dizziness and giddiness 08/16/2014  . Dysrhythmia    hx of atrial fibrilation with cardioversion  . Ear pain 11/26/2010  . Essential hypertension 12/12/2006   Qualifier: Diagnosis of  By: [name] RMA, Lucy    . Fatigue 11/03/2011  . GERD 12/12/2006   Qualifier: Diagnosis of  By: [name] RMA, Lucy    . GOUT 12/12/2006   Qualifier: Diagnosis of  By: Reatha Armour, Lucy    . Hematoma 09/02/2012  . Hematoma of arm 01/26/2013  . History of stroke with residual deficit 12/12/2006   Qualifier: Diagnosis of  By: [name] RMA, Lucy    . Hx of CABG '04 05/04/2009   Qualifier: Diagnosis of  By: Mare Ferrari, [place], Sherri    . Hyperlipidemia LDL goal <70 12/12/2006   Qualifier: Diagnosis of  By:  Brand RMA, Lorre Nick     . Hypertension   . Hypogonadism male 04/10/2011  . Hypothyroidism, acquired 02/15/2013  . Insomnia 05/21/2013  . Long term current use of anticoagulant therapy 06/24/2011  . Obstructive sleep apnea-Failed CPAP 06/17/2007   Failed CPAP Using O 2 for sleep   . Other malaise and fatigue 11/17/2012  . Paroxysmal atrial fibrillation ([place]) 12/06/2016  . Persistent atrial fibrillation ([place]) 04/25/2015  . Pure hypercholesterolemia 05/01/2017  . RHINOSINUSITIS, ACUTE 12/01/2008   Qualifier: Diagnosis of  By: Wynona Luna   . RUQ abdominal pain 01/13/2017  . Sensory disturbance 08/11/2013  . Sinusitis 08/26/2013  . Sleep apnea   . SOB (shortness of breath) 10/21/2016  . Spastic hemiplegia affecting dominant side ([place]) 07/21/2011  . Stroke (cerebrum) ([place]) 01/13/2017  . Stroke ([place]) 1996  . Thoracic aorta  atherosclerosis ([place]) 04/27/2017  . Tubular adenoma of colon 05/2010  . Type 2 diabetes mellitus without complication, without long-term current use of insulin ([place]) 10/05/2007   Qualifier: Diagnosis of  By: Wynona Luna   . URI (upper respiratory infection) 03/05/2012  . Vasovagal syncope   . Viral URI with cough 03/05/2014  . Weakness 11/17/2012     Social History   Socioeconomic History  . Marital status: Married    Spouse name: [name]  . Number of children: Not on file  . Years of education: 94  . Highest education level: Not on file  Occupational History  . 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 Financial resource strain: Not on file  . Food insecurity:    Worry: Not on file    Inability: Not on file  . Transportation needs:    Medical: Not on file    Non-medical: Not on file  Tobacco Use  . Smoking status: Never Smoker  . Smokeless tobacco: Never Used  Substance and Sexual Activity  . Alcohol use: No    Alcohol/week: 0.0 oz    Comment: 1 beer a month  . Drug use: No  . Sexual activity: Never  Lifestyle  . Physical activity:    Days per week: Not on file    Minutes per session: Not on file  . Stress: Not on file  Relationships  . Social connections:    Talks on phone: Not on file    Gets together: Not on file    Attends religious service: Not on file    Active member of club or organization: Not on file    Attends meetings of clubs or organizations: Not on file    Relationship status: Not on file  . Intimate partner violence:    Fear of current or ex partner: Not on file    Emotionally abused: Not on file    Physically abused: Not on file    Forced sexual activity: Not on file  Other Topics Concern  . Not on file  Social History Narrative   Pt lives with wife. Does have stairs, but patient doesn't use them. Pt has completed technical school    Past Surgical History:  Procedure Laterality Date  .  CARDIAC CATHETERIZATION N/A 10/25/2015   Procedure: Left Heart Cath and Cors/Grafts Angiography;  Surgeon: Troy Sine, MD;  Location: [place] [facility];  Service: Cardiovascular;  Laterality: N/A;  . CARDIOVERSION  06/20/2011   Procedure: CARDIOVERSION;  Surgeon: Larey Dresser, MD;  Location: [place];  Service: Cardiovascular;  Laterality:  N/A;  . CARDIOVERSION N/A 04/26/2015   Procedure: CARDIOVERSION;  Surgeon: Dorothy Spark, MD;  Location: [facility] ENDOSCOPY;  Service: Cardiovascular;  Laterality: N/A;  . CARDIOVERSION N/A 05/10/2015   Procedure: CARDIOVERSION;  Surgeon: Larey Dresser, MD;  Location: [place];  Service: Cardiovascular;  Laterality: N/A;  . CATARACT EXTRACTION  05/2010   left eye  . CATARACT EXTRACTION  04/2010   right eye  . CORONARY ARTERY BYPASS GRAFT     stent  . ESOPHAGOGASTRODUODENOSCOPY  03-25-2005  . NASAL SEPTUM SURGERY    . PERCUTANEOUS PLACEMENT INTRAVASCULAR STENT CERVICAL CAROTID ARTERY     03-2009; using a drug-eluting platform of the circumflex cornoray artery with a 3.0 x 18 [employer] Promus drug-eluting platform post dilated to 3.75 with a noncompliant balloon.  . TEE WITHOUT CARDIOVERSION  06/20/2011   Procedure: TRANSESOPHAGEAL ECHOCARDIOGRAM (TEE);  Surgeon: Larey Dresser, MD;  Location: [facility] ENDOSCOPY;  Service: Cardiovascular;  Laterality: N/A;    Family History  Problem Relation Age of Onset  . Lung cancer Father        deceased  . Stroke Mother        deceased-MINISTROKES    No Known Allergies  Current Outpatient Medications on File Prior to Visit  Medication Sig Dispense Refill  . allopurinol (ZYLOPRIM) 100 MG tablet Take 200 mg by mouth daily.    . benazepril (LOTENSIN) 10 MG tablet Take 1 tablet (10 mg total) by mouth daily. 90 tablet 3  . benazepril (LOTENSIN) 5 MG tablet TAKE 1 TABLET BY MOUTH DAILY WITH 10MG  OF BENAZEPRIL TO EQUAL 15MG  DAILY 90 tablet 2  . fluticasone (FLONASE) 50 MCG/ACT nasal spray Place 2 sprays into  both nostrils daily. 9.9 g 1  . furosemide (LASIX) 40 MG tablet Take 1 tablet (40 mg total) by mouth every other day. 45 tablet 1  . isosorbide mononitrate (IMDUR) 30 MG 24 hr tablet Take 0.5 tablets (15 mg total) by mouth daily. 15 tablet 11  . lactulose (CHRONULAC) 10 GM/15ML solution Take 15 mLs (10 g total) by mouth daily as needed for mild constipation. 240 mL 1  . levothyroxine (SYNTHROID, LEVOTHROID) 112 MCG tablet TAKE 1 TABLET (112 MCG TOTAL) BY MOUTH DAILY BEFORE BREAKFAST. 90 tablet 1  . metFORMIN (GLUCOPHAGE) 500 MG tablet TAKE 1 TABLET (500 MG TOTAL) DAILY WITH BREAKFAST BY MOUTH. 90 tablet 1  . Multiple Vitamins-Minerals (MULTIVITAMIN GUMMIES MENS PO) Take 1 tablet by mouth daily with breakfast.     . nitroGLYCERIN (NITROSTAT) 0.4 MG SL tablet Place 1 tablet (0.4 mg total) under the tongue every 5 (five) minutes as needed for chest pain. Up to 3 doses 10 tablet 2  . OXYGEN Inhale 2 L into the lungs at bedtime.    . pantoprazole (PROTONIX) 40 MG tablet Take 1 tablet (40 mg total) every morning by mouth. 90 tablet 1  . traMADol (ULTRAM) 50 MG tablet Take 1 tablet (50 mg total) by mouth 2 (two) times daily. 180 tablet 0  . traMADol (ULTRAM-ER) 200 MG 24 hr tablet Take 1 tablet (200 mg total) by mouth daily. 90 tablet 0  . vitamin B-12 1000 MCG tablet Take 1 tablet (1,000 mcg total) by mouth daily. 30 tablet 0  . warfarin (COUMADIN) 2.5 MG tablet Take as directed by coumadin clinic 90 tablet 0  . atorvastatin (LIPITOR) 40 MG tablet Take 1 tablet (40 mg total) by mouth daily. 90 tablet 3   No current facility-administered medications on file prior to visit.  BP (!) 146/75 (BP Location: Right Arm, Patient Position: Sitting, Cuff Size: Large)   Pulse 65   Temp 98.3 F (36.8 C) (Oral)   Resp 16   Ht 6\' 3"  (1.905 m)   Wt 265 lb 3.2 oz (120.3 kg)   SpO2 96%   BMI 33.15 kg/m        Objective:   Physical Exam  Constitutional: He is oriented to person, place, and time. He  appears well-developed and well-nourished. No distress.  HENT:  Head: Normocephalic and atraumatic.  Cardiovascular: Normal rate and regular rhythm.  No murmur heard. Pulmonary/Chest: Effort normal and breath sounds normal. No respiratory distress. He has no wheezes. He has no rales.  Musculoskeletal: He exhibits no edema.  Neurological: He is alert and oriented to person, place, and time.  Skin: Skin is warm and dry.  Psychiatric: He has a normal mood and affect. His behavior is normal. Thought content normal.          Assessment & Plan:  Diabetes type 2-appears clinically controlled.  Will obtain follow-up A1c  Low back pain- declines physical therapy.  Requests prescription for back brace.  Prescription provided  History of stroke-requests prescription for refill on his rolling walker and his front wheel walker.  Hand written prescription provided  Hypertension- blood pressure is fair.  Continue current medications.  Hypothyroid-clinically stable on Synthroid.  Continue same.  Gout-stable on allopurinol.  Continue same.  GERD-stable on Protonix.  Continue same.

## 2017-10-12 NOTE — Patient Instructions (Signed)
Please complete lab work prior to leaving.   

## 2017-10-15 NOTE — Progress Notes (Deleted)
Cardiology Office Note   Date:  10/15/2017   ID:  [name], DOB May 13, 1945, MRN [id number]  PCP:  Debbrah Alar, NP  Cardiologist:  End    No chief complaint on file.     History of Present Illness: Tsuneo Faison is a 71 y.o. male who presents for ***   history of coronary artery disease status post CABG, chronic diastolic heart failure, paroxysmal atrial fibrillation, stroke, hypertension, hyperlipidemia, and obstructive sleep apnea, who presents for follow-up of coronary artery disease, heart failure, and atrial fibrillation.  I last saw March, at which time he reported feeling better after escalation of isosorbide mononitrate.  Last week, he mentioned intermittent paresthesias in his arms during INR check.  He was concerned that this may reflect an anginal equivalent for him and therefore presents for urgent follow-up today.  Today, Mr. Tamburo reports that he has had numbness and a cold feeling in both arms for several months.  It seems to be most pronounced when he is sitting still or driving.  It is not positional.  He denies pain in his arms as well as chest pain, shortness of breath, edema, and palpitations.  He has noted that his strength seems to be a little worse compared with last year.  He also finds it challenging to walk, stating that it is sometimes hard for him to initiate movements.  He has mentioned some of these complaints to his neurologist but feels like a clear explanation has he yet to be found.  He remains compliant with his medications, including warfarin.  He notes occasional lightheadedness but has not fallen.  He ambulates with a walker.  Mild crotid stenosis bilaterLNo significant subclavian or bilateral upper extremity stenosis. No further testing or intervention at this time. Patient advised to follow-up with neurology if symptoms persist   Past Medical History:  Diagnosis Date  . Acute encephalopathy 12/12/2015  . Acute respiratory  failure with hypoxemia ([place]) 12/12/2015  . Acute respiratory failure with hypoxia ([place]) 06/17/2011  . Acute respiratory failure with hypoxia and hypercapnia ([place]) 12/12/2015  . Allergic rhinitis 02/13/2010   Qualifier: Diagnosis of  By: Wynona Luna   . Angina at rest [facility]) 10/22/2015  . Ataxia 08/10/2013  . Atrial fibrillation with RVR ([place]) 06/17/2011  . B12 deficiency 08/11/2013  . BACK PAIN, LUMBAR 01/29/2009   Qualifier: Diagnosis of  By: Wynona Luna   . BENIGN POSITIONAL VERTIGO 11/23/2009   Qualifier: Diagnosis of  By: Wynona Luna   . CAD (coronary artery disease)    s/p CABG, s/p DES to LCX  January 2011  . CAD S/P CFX DES 2011 04/25/2015  . CALLUS, LEFT FOOT 03/21/2008   Qualifier: Diagnosis of  By: Wynona Luna   . Chest pain 04/24/2015  . Chronic diastolic heart failure ([place]) 04/24/2015  . Chronic pain    left sided-[place]  . Constipation 04/24/2015  . Coronary 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 ([place]) 11/04/2015  . CVA (cerebral infarction)    left hemiparesis  . Dark urine 04/10/2011  . Diastolic CHF ([place])   . Dizziness and giddiness 08/16/2014  . Dysrhythmia    hx of atrial fibrilation with cardioversion  . Ear pain 11/26/2010  . Essential hypertension 12/12/2006   Qualifier: Diagnosis of  By: [name] RMA, Lucy    . Fatigue 11/03/2011  . GERD 12/12/2006   Qualifier: Diagnosis of  By: [name] RMA, Lucy    .  GOUT 12/12/2006   Qualifier: Diagnosis of  By: Reatha Armour, Lucy    . Hematoma 09/02/2012  . Hematoma of arm 01/26/2013  . History of stroke with residual deficit 12/12/2006   Qualifier: Diagnosis of  By: [name] RMA, Lucy    . Hx of CABG '04 05/04/2009   Qualifier: Diagnosis of  By: Mare Ferrari, [place], Sherri    . Hyperlipidemia LDL goal <70 12/12/2006   Qualifier: Diagnosis of  By: [name] RMA, Lucy     . Hypertension   . Hypogonadism male 04/10/2011  . Hypothyroidism, acquired 02/15/2013  . Insomnia 05/21/2013  . Long term current use of anticoagulant  therapy 06/24/2011  . Obstructive sleep apnea-Failed CPAP 06/17/2007   Failed CPAP Using O 2 for sleep   . Other malaise and fatigue 11/17/2012  . Paroxysmal atrial fibrillation ([place]) 12/06/2016  . Persistent atrial fibrillation ([place]) 04/25/2015  . Pure hypercholesterolemia 05/01/2017  . RHINOSINUSITIS, ACUTE 12/01/2008   Qualifier: Diagnosis of  By: Wynona Luna   . RUQ abdominal pain 01/13/2017  . Sensory disturbance 08/11/2013  . Sinusitis 08/26/2013  . Sleep apnea   . SOB (shortness of breath) 10/21/2016  . Spastic hemiplegia affecting dominant side ([place]) 07/21/2011  . Stroke (cerebrum) ([place]) 01/13/2017  . Stroke ([place]) 1996  . Thoracic aorta atherosclerosis ([place]) 04/27/2017  . Tubular adenoma of colon 05/2010  . Type 2 diabetes mellitus without complication, without long-term current use of insulin ([place]) 10/05/2007   Qualifier: Diagnosis of  By: Wynona Luna   . URI (upper respiratory infection) 03/05/2012  . Vasovagal syncope   . Viral URI with cough 03/05/2014  . Weakness 11/17/2012    Past Surgical History:  Procedure Laterality Date  . CARDIAC CATHETERIZATION N/A 10/25/2015   Procedure: Left Heart Cath and Cors/Grafts Angiography;  Surgeon: Troy Sine, MD;  Location: [place] [facility];  Service: Cardiovascular;  Laterality: N/A;  . CARDIOVERSION  06/20/2011   Procedure: CARDIOVERSION;  Surgeon: Larey Dresser, MD;  Location[facility] ENDOSCOPY;  Service: Cardiovascular;  Laterality: N/A;  . CARDIOVERSION N/A 04/26/2015   Procedure: CARDIOVERSION;  Surgeon: Dorothy Spark, MD;  Location: [facility] ENDOSCOPY;  Service: Cardiovascular;  Laterality: N/A;  . CARDIOVERSION N/A 05/10/2015   Procedure: CARDIOVERSION;  Surgeon: Larey Dresser, MD;  Location: [place];  Service: Cardiovascular;  Laterality: N/A;  . CATARACT EXTRACTION  05/2010   left eye  . CATARACT EXTRACTION  04/2010   right eye  . CORONARY ARTERY BYPASS GRAFT     stent  . ESOPHAGOGASTRODUODENOSCOPY  03-25-2005  . NASAL  SEPTUM SURGERY    . PERCUTANEOUS PLACEMENT INTRAVASCULAR STENT CERVICAL CAROTID ARTERY     03-2009; using a drug-eluting platform of the circumflex cornoray artery with a 3.0 x 18 [employer] Promus drug-eluting platform post dilated to 3.75 with a noncompliant balloon.  . TEE WITHOUT CARDIOVERSION  06/20/2011   Procedure: TRANSESOPHAGEAL ECHOCARDIOGRAM (TEE);  Surgeon: Larey Dresser, MD;  Location: [facility] ENDOSCOPY;  Service: Cardiovascular;  Laterality: N/A;     Current Outpatient Medications  Medication Sig Dispense Refill  . allopurinol (ZYLOPRIM) 100 MG tablet Take 200 mg by mouth daily.    Marland Kitchen atorvastatin (LIPITOR) 40 MG tablet Take 1 tablet (40 mg total) by mouth daily. 90 tablet 3  . benazepril (LOTENSIN) 10 MG tablet Take 1 tablet (10 mg total) by mouth daily. 90 tablet 3  . benazepril (LOTENSIN) 5 MG tablet TAKE 1 TABLET BY MOUTH DAILY WITH 10MG  OF BENAZEPRIL TO  EQUAL 15MG  DAILY 90 tablet 2  . fluticasone (FLONASE) 50 MCG/ACT nasal spray Place 2 sprays into both nostrils daily. 9.9 g 1  . furosemide (LASIX) 40 MG tablet Take 1 tablet (40 mg total) by mouth every other day. 45 tablet 1  . isosorbide mononitrate (IMDUR) 30 MG 24 hr tablet Take 0.5 tablets (15 mg total) by mouth daily. 15 tablet 11  . lactulose (CHRONULAC) 10 GM/15ML solution Take 15 mLs (10 g total) by mouth daily as needed for mild constipation. 240 mL 1  . levothyroxine (SYNTHROID, LEVOTHROID) 112 MCG tablet TAKE 1 TABLET (112 MCG TOTAL) BY MOUTH DAILY BEFORE BREAKFAST. 90 tablet 1  . metFORMIN (GLUCOPHAGE) 500 MG tablet TAKE 1 TABLET (500 MG TOTAL) DAILY WITH BREAKFAST BY MOUTH. 90 tablet 1  . Multiple Vitamins-Minerals (MULTIVITAMIN GUMMIES MENS PO) Take 1 tablet by mouth daily with breakfast.     . nitroGLYCERIN (NITROSTAT) 0.4 MG SL tablet Place 1 tablet (0.4 mg total) under the tongue every 5 (five) minutes as needed for chest pain. Up to 3 doses 10 tablet 2  . OXYGEN Inhale 2 L into the lungs at bedtime.    .  pantoprazole (PROTONIX) 40 MG tablet Take 1 tablet (40 mg total) every morning by mouth. 90 tablet 1  . traMADol (ULTRAM) 50 MG tablet Take 1 tablet (50 mg total) by mouth 2 (two) times daily. 180 tablet 0  . traMADol (ULTRAM-ER) 200 MG 24 hr tablet Take 1 tablet (200 mg total) by mouth daily. 90 tablet 0  . vitamin B-12 1000 MCG tablet Take 1 tablet (1,000 mcg total) by mouth daily. 30 tablet 0  . warfarin (COUMADIN) 2.5 MG tablet Take as directed by coumadin clinic 90 tablet 0   No current facility-administered medications for this visit.     Allergies:   Patient has no known allergies.    Social History:  The patient  reports that he has never smoked. He has never used smokeless tobacco. He reports that he does not drink alcohol or use drugs.   Family History:  The patient's ***family history includes Lung cancer in his father; Stroke in his mother.    ROS:  General:no colds or fevers, no weight changes Skin:no rashes or ulcers HEENT:no blurred vision, no congestion CV:see HPI PUL:see HPI GI:no diarrhea constipation or melena, no indigestion GU:no hematuria, no dysuria MS:no joint pain, no claudication Neuro:no syncope, no lightheadedness Endo:no diabetes, no thyroid disease Wt Readings from Last 3 Encounters:  10/12/17 265 lb 3.2 oz (120.3 kg)  09/21/17 266 lb (120.7 kg)  09/11/17 266 lb (120.7 kg)     PHYSICAL EXAM: VS:  There were no vitals taken for this visit. , BMI There is no height or weight on file to calculate BMI. General:Pleasant affect, NAD Skin:Warm and dry, brisk capillary refill HEENT:normocephalic, sclera clear, mucus membranes moist Neck:supple, no JVD, no bruits  Heart:S1S2 RRR without murmur, gallup, rub or click Lungs:clear without rales, rhonchi, or wheezes JSH:FWYO, non tender, + BS, do not palpate liver spleen or masses Ext:no lower ext edema, 2+ pedal pulses, 2+ radial pulses Neuro:alert and oriented, MAE, follows commands, + facial  symmetry    EKG:  EKG is ordered today. The ekg ordered today demonstrates ***   Recent Labs: 01/13/2017: B Natriuretic Peptide 111.5 04/29/2017: Hemoglobin 13.7; Platelets 202 07/13/2017: ALT 43; TSH 1.29 10/12/2017: BUN 19; Creatinine, Ser 1.06; Potassium 4.0; Sodium 141    Lipid Panel    Component Value Date/Time   CHOL  131 07/13/2017 1024   CHOL 138 12/05/2016 1100   TRIG 97.0 07/13/2017 1024   HDL 45.00 07/13/2017 1024   HDL 46 12/05/2016 1100   CHOLHDL 3 07/13/2017 1024   VLDL 19.4 07/13/2017 1024   LDLCALC 66 07/13/2017 1024   LDLCALC 67 12/05/2016 1100       Other studies Reviewed: Additional studies/ records that were reviewed today include: ***.   ASSESSMENT AND PLAN:  1.  ***   Current medicines are reviewed with the patient today.  The patient Has no concerns regarding medicines.  The following changes have been made:  See above Labs/ tests ordered today include:see above  Disposition:   FU:  see above  Signed, Cecilie Kicks, NP  10/15/2017 10:11 PM    [facility] [address], [place], [place], [place] Phone: [phone]; Fax: [phone]

## 2017-10-16 ENCOUNTER — Ambulatory Visit: Payer: Medicare Other | Admitting: Cardiology

## 2017-10-20 ENCOUNTER — Other Ambulatory Visit: Payer: Self-pay | Admitting: Physical Medicine & Rehabilitation

## 2017-10-20 NOTE — Telephone Encounter (Signed)
rec'd electronic refill request

## 2017-11-02 ENCOUNTER — Ambulatory Visit: Payer: Medicare Other | Admitting: Physical Medicine & Rehabilitation

## 2017-11-03 ENCOUNTER — Encounter: Payer: Self-pay | Admitting: Physical Medicine & Rehabilitation

## 2017-11-03 ENCOUNTER — Encounter: Payer: Medicare Other | Attending: Physical Medicine & Rehabilitation

## 2017-11-03 ENCOUNTER — Ambulatory Visit (HOSPITAL_BASED_OUTPATIENT_CLINIC_OR_DEPARTMENT_OTHER): Payer: Medicare Other | Admitting: Physical Medicine & Rehabilitation

## 2017-11-03 VITALS — BP 137/91 | HR 87 | Ht 75.0 in | Wt 265.0 lb

## 2017-11-03 DIAGNOSIS — G8111 Spastic hemiplegia affecting right dominant side: Secondary | ICD-10-CM | POA: Diagnosis not present

## 2017-11-03 DIAGNOSIS — G894 Chronic pain syndrome: Secondary | ICD-10-CM

## 2017-11-03 DIAGNOSIS — G8110 Spastic hemiplegia affecting unspecified side: Secondary | ICD-10-CM

## 2017-11-03 DIAGNOSIS — Z79891 Long term (current) use of opiate analgesic: Secondary | ICD-10-CM | POA: Diagnosis not present

## 2017-11-03 DIAGNOSIS — Z5181 Encounter for therapeutic drug level monitoring: Secondary | ICD-10-CM

## 2017-11-03 DIAGNOSIS — M62838 Other muscle spasm: Secondary | ICD-10-CM

## 2017-11-03 MED ORDER — TRAMADOL HCL 50 MG PO TABS: 50.0000 mg | ORAL_TABLET | Freq: Two times a day (BID) | ORAL | 1 refills | Status: DC

## 2017-11-03 MED ORDER — TRAMADOL HCL ER 200 MG PO TB24: 200.0000 mg | ORAL_TABLET | Freq: Every day | ORAL | 1 refills | Status: DC

## 2017-11-03 NOTE — Patient Instructions (Signed)
Bicipital tendon sheath injection  Indication biceps Tenosynovitis which does not respond to conservative care including medications and therapy and interferes with activity causing pain  Informed consent was obtained after describing risks and benefits of the procedure with the patient including bleeding bruising and infection he elects to proceed and has given written consent Patient placed in a supine position palm up with the hand at the side. Area was scant using 12 Hz linear transducer. Biceps tendon just distal to the groove was identified. Area prepped with Betadine. Needle track was anesthetize with 1% lidocaine x2 ML. The tendon sheath was entered and injection with Celestone 6 mg per mL x0.5 mL as well as lidocaine 1% times one ML. Patient tolerated procedure well. Post procedure instructions given.  Return to clinic one month for followup

## 2017-11-03 NOTE — Progress Notes (Signed)
Botox Injection for spasticity using needle EMG guidance  Dilution: 50 Units/ml Indication: Severe spasticity which interferes with ADL,mobility and/or  hygiene and is unresponsive to medication management and other conservative care Informed consent was obtained after describing risks and benefits of the procedure with the patient. This includes bleeding, bruising, infection, excessive weakness, or medication side effects. A REMS form is on file and signed. Needle: 27g 1" needle electrode Number of units per muscle hamstrings 200 units  FDS 25 units, FDP 25 units,  continue FCR 50 units  All injections were done after obtaining appropriate EMG activity and after negative drawback for blood. The patient tolerated the procedure well. Post procedure instructions were given. A followup appointment was made.

## 2017-11-05 ENCOUNTER — Ambulatory Visit (INDEPENDENT_AMBULATORY_CARE_PROVIDER_SITE_OTHER): Payer: Medicare Other

## 2017-11-05 ENCOUNTER — Ambulatory Visit (INDEPENDENT_AMBULATORY_CARE_PROVIDER_SITE_OTHER): Payer: Medicare Other | Admitting: Cardiology

## 2017-11-05 ENCOUNTER — Encounter: Payer: Self-pay | Admitting: Cardiology

## 2017-11-05 VITALS — BP 126/72 | HR 90 | Ht 75.0 in | Wt 265.0 lb

## 2017-11-05 DIAGNOSIS — I4891 Unspecified atrial fibrillation: Secondary | ICD-10-CM | POA: Diagnosis not present

## 2017-11-05 DIAGNOSIS — I6523 Occlusion and stenosis of bilateral carotid arteries: Secondary | ICD-10-CM | POA: Diagnosis not present

## 2017-11-05 DIAGNOSIS — I5032 Chronic diastolic (congestive) heart failure: Secondary | ICD-10-CM

## 2017-11-05 DIAGNOSIS — Z7901 Long term (current) use of anticoagulants: Secondary | ICD-10-CM

## 2017-11-05 DIAGNOSIS — R202 Paresthesia of skin: Secondary | ICD-10-CM | POA: Diagnosis not present

## 2017-11-05 DIAGNOSIS — I480 Paroxysmal atrial fibrillation: Secondary | ICD-10-CM | POA: Diagnosis not present

## 2017-11-05 DIAGNOSIS — I6930 Unspecified sequelae of cerebral infarction: Secondary | ICD-10-CM | POA: Diagnosis not present

## 2017-11-05 DIAGNOSIS — I25119 Atherosclerotic heart disease of native coronary artery with unspecified angina pectoris: Secondary | ICD-10-CM

## 2017-11-05 LAB — POCT INR: INR: 3.1 — AB (ref 2.0–3.0)

## 2017-11-05 NOTE — Progress Notes (Signed)
Cardiology Office Note   Date:  11/07/2017   ID:  [name], DOB 1945-04-17, MRN [id number]  PCP:  Debbrah Alar, NP  Cardiologist:  Dr. Saunders Revel     Chief Complaint  Patient presents with  . Numbness      History of Present Illness: [name] is a 72 y.o. male who presents for intermittent paresthesias.   Pt with history of coronary artery disease status post CABG, chronic diastolic heart failure, paroxysmal atrial fibrillation, stroke, hypertension, hyperlipidemia, and obstructive sleep apnea, who presents for follow-up of coronary artery disease, heart failure, and atrial fibrillation.  Dr. Saunders Revel saw in March, at which time he reported feeling better after escalation of isosorbide mononitrate.  Last visit, he mentioned intermittent paresthesias in his arms during INR check.  He was concerned that this may reflect an anginal equivalent for him.  Dr. Saunders Revel did not believe this to be cardiac.     Arterial dopplers were done Mild stenosis in bilateral ICAs. No significant subclavian or bilateral upper extremity stenosis. No further testing or intervention at this time. Patient advised to follow-up with neurology if symptoms persist.  Today he continues with paresthesias and states he has had since stroke.  He has not followed up with Neuro.  He is also complaining of increased weakness in lt side that to him seems to be increasing.  He also seems not to remember the reason for prior testing.  I have not seen before so do not know if this is new.  He has no chest pain and no SOB.  He does complain of constipation.  He is also followed for chronic pain.   Past Medical History:  Diagnosis Date  . Acute encephalopathy 12/12/2015  . Acute respiratory failure with hypoxemia ([place]) 12/12/2015  . Acute respiratory failure with hypoxia ([place]) 06/17/2011  . Acute respiratory failure with hypoxia and hypercapnia ([place]) 12/12/2015  . Allergic rhinitis 02/13/2010   Qualifier: Diagnosis of   By: Wynona Luna   . Angina at rest [facility]) 10/22/2015  . Ataxia 08/10/2013  . Atrial fibrillation with RVR ([place]) 06/17/2011  . B12 deficiency 08/11/2013  . BACK PAIN, LUMBAR 01/29/2009   Qualifier: Diagnosis of  By: Wynona Luna   . BENIGN POSITIONAL VERTIGO 11/23/2009   Qualifier: Diagnosis of  By: Wynona Luna   . CAD (coronary artery disease)    s/p CABG, s/p DES to LCX  January 2011  . CAD S/P CFX DES 2011 04/25/2015  . CALLUS, LEFT FOOT 03/21/2008   Qualifier: Diagnosis of  By: Wynona Luna   . Chest pain 04/24/2015  . Chronic diastolic heart failure ([place]) 04/24/2015  . Chronic pain    left sided-[place]  . Constipation 04/24/2015  . Coronary 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 ([place]) 11/04/2015  . CVA (cerebral infarction)    left hemiparesis  . Dark urine 04/10/2011  . Diastolic CHF ([place])   . Dizziness and giddiness 08/16/2014  . Dysrhythmia    hx of atrial fibrilation with cardioversion  . Ear pain 11/26/2010  . Essential hypertension 12/12/2006   Qualifier: Diagnosis of  By: [name] RMA, Lucy    . Fatigue 11/03/2011  . GERD 12/12/2006   Qualifier: Diagnosis of  By: [name] RMA, Lucy    . GOUT 12/12/2006   Qualifier: Diagnosis of  By: Reatha Armour, Lucy    . Hematoma 09/02/2012  . Hematoma of arm 01/26/2013  . History of  stroke with residual deficit 12/12/2006   Qualifier: Diagnosis of  By: [name] RMA, Lucy    . Hx of CABG '04 05/04/2009   Qualifier: Diagnosis of  By: Mare Ferrari, [place], Sherri    . Hyperlipidemia LDL goal <70 12/12/2006   Qualifier: Diagnosis of  By: [name] RMA, Lucy     . Hypertension   . Hypogonadism male 04/10/2011  . Hypothyroidism, acquired 02/15/2013  . Insomnia 05/21/2013  . Long term current use of anticoagulant therapy 06/24/2011  . Obstructive sleep apnea-Failed CPAP 06/17/2007   Failed CPAP Using O 2 for sleep   . Other malaise and fatigue 11/17/2012  . Paroxysmal atrial fibrillation ([place]) 12/06/2016  . Persistent atrial fibrillation ([place])  04/25/2015  . Pure hypercholesterolemia 05/01/2017  . RHINOSINUSITIS, ACUTE 12/01/2008   Qualifier: Diagnosis of  By: Wynona Luna   . RUQ abdominal pain 01/13/2017  . Sensory disturbance 08/11/2013  . Sinusitis 08/26/2013  . Sleep apnea   . SOB (shortness of breath) 10/21/2016  . Spastic hemiplegia affecting dominant side ([place]) 07/21/2011  . Stroke (cerebrum) ([place]) 01/13/2017  . Stroke ([place]) 1996  . Thoracic aorta atherosclerosis ([place]) 04/27/2017  . Tubular adenoma of colon 05/2010  . Type 2 diabetes mellitus without complication, without long-term current use of insulin ([place]) 10/05/2007   Qualifier: Diagnosis of  By: Wynona Luna   . URI (upper respiratory infection) 03/05/2012  . Vasovagal syncope   . Viral URI with cough 03/05/2014  . Weakness 11/17/2012    Past Surgical History:  Procedure Laterality Date  . CARDIAC CATHETERIZATION N/A 10/25/2015   Procedure: Left Heart Cath and Cors/Grafts Angiography;  Surgeon: Troy Sine, MD;  Location: [place] CV LAB;  Service: Cardiovascular;  Laterality: N/A;  . CARDIOVERSION  06/20/2011   Procedure: CARDIOVERSION;  Surgeon: Larey Dresser, MD;  Location: [facility] ENDOSCOPY;  Service: Cardiovascular;  Laterality: N/A;  . CARDIOVERSION N/A 04/26/2015   Procedure: CARDIOVERSION;  Surgeon: Dorothy Spark, MD;  Location: [facility] ENDOSCOPY;  Service: Cardiovascular;  Laterality: N/A;  . CARDIOVERSION N/A 05/10/2015   Procedure: CARDIOVERSION;  Surgeon: Larey Dresser, MD;  Location: [place];  Service: Cardiovascular;  Laterality: N/A;  . CATARACT EXTRACTION  05/2010   left eye  . CATARACT EXTRACTION  04/2010   right eye  . CORONARY ARTERY BYPASS GRAFT     stent  . ESOPHAGOGASTRODUODENOSCOPY  03-25-2005  . NASAL SEPTUM SURGERY    . PERCUTANEOUS PLACEMENT INTRAVASCULAR STENT CERVICAL CAROTID ARTERY     03-2009; using a drug-eluting platform of the circumflex cornoray artery with a 3.0 x 18 [employer] Promus drug-eluting platform post  dilated to 3.75 with a noncompliant balloon.  . TEE WITHOUT CARDIOVERSION  06/20/2011   Procedure: TRANSESOPHAGEAL ECHOCARDIOGRAM (TEE);  Surgeon: Larey Dresser, MD;  Location: [facility] ENDOSCOPY;  Service: Cardiovascular;  Laterality: N/A;     Current Outpatient Medications  Medication Sig Dispense Refill  . allopurinol (ZYLOPRIM) 100 MG tablet Take 200 mg by mouth daily.    Marland Kitchen atorvastatin (LIPITOR) 40 MG tablet Take 1 tablet (40 mg total) by mouth daily. 90 tablet 3  . benazepril (LOTENSIN) 10 MG tablet Take 1 tablet (10 mg total) by mouth daily. 90 tablet 3  . benazepril (LOTENSIN) 5 MG tablet TAKE 1 TABLET BY MOUTH DAILY WITH 10MG  OF BENAZEPRIL TO EQUAL 15MG  DAILY 90 tablet 2  . fluticasone (FLONASE) 50 MCG/ACT nasal spray Place 2 sprays into both nostrils daily. 9.9 g 1  . furosemide (LASIX)  40 MG tablet Take 1 tablet (40 mg total) by mouth every other day. 45 tablet 1  . isosorbide mononitrate (IMDUR) 30 MG 24 hr tablet Take 0.5 tablets (15 mg total) by mouth daily. 15 tablet 11  . lactulose (CHRONULAC) 10 GM/15ML solution Take 15 mLs (10 g total) by mouth daily as needed for mild constipation. 240 mL 1  . levothyroxine (SYNTHROID, LEVOTHROID) 112 MCG tablet TAKE 1 TABLET (112 MCG TOTAL) BY MOUTH DAILY BEFORE BREAKFAST. 90 tablet 1  . metFORMIN (GLUCOPHAGE) 500 MG tablet TAKE 1 TABLET (500 MG TOTAL) DAILY WITH BREAKFAST BY MOUTH. 90 tablet 1  . Multiple Vitamins-Minerals (MULTIVITAMIN GUMMIES MENS PO) Take 1 tablet by mouth daily with breakfast.     . nitroGLYCERIN (NITROSTAT) 0.4 MG SL tablet Place 1 tablet (0.4 mg total) under the tongue every 5 (five) minutes as needed for chest pain. Up to 3 doses 10 tablet 2  . OXYGEN Inhale 2 L into the lungs at bedtime.    . pantoprazole (PROTONIX) 40 MG tablet Take 1 tablet (40 mg total) every morning by mouth. 90 tablet 1  . traMADol (ULTRAM) 50 MG tablet Take 1 tablet (50 mg total) by mouth 2 (two) times daily. 180 tablet 1  . traMADol (ULTRAM-ER)  200 MG 24 hr tablet Take 1 tablet (200 mg total) by mouth daily. 90 tablet 1  . vitamin B-12 1000 MCG tablet Take 1 tablet (1,000 mcg total) by mouth daily. 30 tablet 0  . warfarin (COUMADIN) 2.5 MG tablet Take as directed by coumadin clinic 90 tablet 0   No current facility-administered medications for this visit.     Allergies:   Patient has no known allergies.    Social History:  The patient  reports that he has never smoked. He has never used smokeless tobacco. He reports that he does not drink alcohol or use drugs.   Family History:  The patient's family history includes Lung cancer in his father; Stroke in his mother.    ROS:  General:no colds or fevers, no weight changes Skin:no rashes or ulcers HEENT:no blurred vision, no congestion CV:see HPI PUL:see HPI GI:no diarrhea constipation or melena, no indigestion GU:no hematuria, no dysuria MS:no joint pain, no claudication Neuro:no syncope, no lightheadedness Endo:no diabetes, no thyroid disease  Wt Readings from Last 3 Encounters:  11/05/17 265 lb (120.2 kg)  11/03/17 265 lb (120.2 kg)  10/12/17 265 lb 3.2 oz (120.3 kg)     PHYSICAL EXAM: VS:  BP 126/72   Pulse 90   Ht 6\' 3"  (1.905 m)   Wt 265 lb (120.2 kg)   SpO2 96%   BMI 33.12 kg/m  , BMI Body mass index is 33.12 kg/m. General:Pleasant affect, NAD Skin:Warm and dry, brisk capillary refill HEENT:normocephalic, sclera clear, mucus membranes moist Neck:supple, no JVD, no bruits  Heart:S1S2 RRR without murmur, gallup, rub or click Lungs:clear without rales, rhonchi, or wheezes JGG:EZMO, non tender, + BS, do not palpate liver spleen or masses Ext:no lower ext edema, 2+ pedal pulses, 2+ radial pulses Neuro:alert and oriented, MAE, follows commands, + facial symmetry    EKG:  EKG is NOT ordered today.    Recent Labs: 01/13/2017: B Natriuretic Peptide 111.5 04/29/2017: Hemoglobin 13.7; Platelets 202 07/13/2017: ALT 43; TSH 1.29 10/12/2017: BUN 19; Creatinine,  Ser 1.06; Potassium 4.0; Sodium 141    Lipid Panel    Component Value Date/Time   CHOL 131 07/13/2017 1024   CHOL 138 12/05/2016 1100   TRIG 97.0 07/13/2017  1024   HDL 45.00 07/13/2017 1024   HDL 46 12/05/2016 1100   CHOLHDL 3 07/13/2017 1024   VLDL 19.4 07/13/2017 1024   LDLCALC 66 07/13/2017 1024   LDLCALC 67 12/05/2016 1100       Other studies Reviewed: Additional studies/ records that were reviewed today include: .  Carotid dopplers 10/01/17 Final Interpretation: Right Carotid: Velocities in the right ICA are consistent with a 1-39% stenosis.  Left Carotid: Velocities in the left ICA are consistent with a 1-39% stenosis.       Non-hemodynamically significant plaque noted in the CCA.       Technically challenging study due to depth of vessels,       bilaterally.  Vertebrals: Bilateral vertebral arteries demonstrate antegrade flow. Subclavians: Normal flow hemodynamics were seen in bilateral subclavian       arteries.  Cath/PCI:  LHC (10/25/15): LMCA normal. LAD with 90% ostial stenosis with competitive flow in the midvessel. D1 ostially occluded. LCx with 95% ostial stenosis. 30% mid LCx ISR. Ostially occluded OM2. Small, non-dominant RCA that is chronically occluded. LIMA->LAD patent with 95% stenosis in the apical LAD distal to the anastomosis. Patent SVG Y-graft to D1 and OM2. SVG->PDA known to be occluded.  CV Surgery:  CABG (LIMA->LAD, SVG Y-graft ->D1 and OM2, and SVG->PDA)  Non-Invasive Evaluation(s):  Pharmacologic MPI ( 05/07/17): Low risk study with small inferolateral defect consistent with attenuation artifact and/or subendocardial scar. No ischemia. LVEF 53%.  TTE (04/26/15): Normal LV size with mild LVH. LVEF 50-55% with normal wall motion and diastolic function. Mildly thickened aortic valve. Mild left atrial enlargement. Moderate TR. Normal RV size and function.   CTA NECK FINDINGS  Aortic arch: Standard 3 vessel  aortic arch with mild atherosclerotic plaque. Widely patent arch vessel origins.  Right carotid system: Patent without evidence of stenosis or dissection. Mild nonstenotic soft and calcified plaque about the carotid bifurcation.  Left carotid system: Patent without evidence of stenosis or dissection. Mild nonstenotic calcified plaque in the mid common carotid artery and at the carotid bifurcation. Mildly tortuous proximal common carotid artery.  Vertebral arteries: The vertebral arteries are patent and codominant. Calcified plaque at the right vertebral artery origin results in likely moderate stenosis. Calcified plaque at the left vertebral artery origin and more distally in the left V1 segment results in up to moderate if not severe stenosis.  Skeleton: Advanced C1-2 arthropathy. Moderate lower cervical disc degeneration and moderate cervical facet arthrosis.  Other neck: Detailed assessment of the maxillofacial soft tissues deferred to the separate dedicated CT. No neck mass.  Upper chest: Clear lung apices.  Prior CABG.  Review of the MIP images confirms the above findings  CTA HEAD FINDINGS  Anterior circulation: The internal carotid arteries are patent from skull base to carotid termini with mild siphon atherosclerosis and slight ectasia. No significant siphon stenosis is present. ACAs and MCAs are patent without evidence of proximal branch occlusion or significant proximal stenosis. No aneurysm.  Posterior circulation: The intracranial vertebral arteries are patent to the basilar with mild irregularity but no significant stenosis. Right PICA is patent. Left PICA and AICA is are not identified. SCAs are grossly patent. The basilar artery is patent without stenosis. There is a fetal type origin of the left PCA. Both PCAs are patent with mild atherosclerotic irregularity but no flow limiting proximal stenosis. No aneurysm.  Venous sinuses:  Patent.  Anatomic variants: Fetal origin of the left PCA.  Delayed phase: No abnormal enhancement.  Review of  the MIP images confirms the above findings  IMPRESSION: 1. No evidence of acute intracranial abnormality. Chronic ischemic changes as above. 2. No large vessel occlusion. Mild intracranial atherosclerosis without significant stenosis. 3. Moderate right vertebral artery origin stenosis. Moderate to severe proximal left vertebral artery stenoses. 4. Mild cervical carotid artery atherosclerosis without stenosis. 5.  Aortic Atherosclerosis (ICD10-I70.0).  ASSESSMENT AND PLAN:  1.  Paresthesias pt now states he has had since stroke.  He does complain of increasing weakness on left, and having now to learn to write with Rt hand.  Our studies were normal but on CT angio of neck in 03/2017 .  Moderate right vertebral artery origin stenosis. Moderate to severe proximal left vertebral artery stenoses. But with increased weakness will defer to neurology and pt is having difficult time planning appt.  2. Chronic diastolic HF euvolemic today no change in lasix  3   CAD no angina currently  4.   PAF maintaining SR on coumadin for CHADSVASC score of at least 5 will check INR today.  5.   HTN controlled today   Dr. Saunders Revel will no longer seen pt's in [place] office, only in [place] --I have told pt and he does not wish to follow to [place] and he does not wish to move to [employer] office.  I will ask Dr. Marlou Porch to assume care.     Current medicines are reviewed with the patient today.  The patient Has no concerns regarding medicines.  The following changes have been made:  See above Labs/ tests ordered today include:see above  Disposition:   FU:  see above  Signed, Cecilie Kicks, NP  11/07/2017 6:34 PM    [facility] [place], [place], [address] [place], [place] Phone: [phone]; Fax: [phone]

## 2017-11-05 NOTE — Patient Instructions (Signed)
Medication Instructions:  1. Your physician recommends that you continue on your current medications as directed. Please refer to the Current Medication list given to you today.   Labwork: NONE ORDERED TODAY  Testing/Procedures: NONE ORDERED TODAY  Follow-Up: 6 MONTHS WITH DR. Marlou Porch  Any Other Special Instructions Will Be Listed Below (If Applicable). WE HAVE MADE AN APPT FOR YOU TO BE SEEN AT DR. Clydene Fake OFFICE  [facility] FOR Monday November 09, 2017 @ 2:15 PM WITH AN ARRIVAL TIME OF 1:45 FOR REGISTRATION    If you need a refill on your cardiac medications before your next appointment, please call your pharmacy.

## 2017-11-05 NOTE — Patient Instructions (Signed)
Description   Start taking 1 tablet everyday except 1/2 tablet on Sundays, Tuesdays and Thursdays. Recheck INR 4 weeks. Main [phone] Coumadin clinic [phone]

## 2017-11-07 ENCOUNTER — Encounter: Payer: Self-pay | Admitting: Cardiology

## 2017-11-08 LAB — DRUG TOX MONITOR 1 W/CONF, ORAL FLD
AMPHETAMINES: NEGATIVE ng/mL (ref <10)
BARBITURATES: NEGATIVE ng/mL (ref <10)
BENZODIAZEPINES: NEGATIVE ng/mL (ref <0.50)
Buprenorphine: NEGATIVE ng/mL (ref <0.10)
Cocaine: NEGATIVE ng/mL (ref <5.0)
Fentanyl: NEGATIVE ng/mL (ref <0.10)
Heroin Metabolite: NEGATIVE ng/mL (ref <1.0)
MARIJUANA: NEGATIVE ng/mL (ref <2.5)
MDMA: NEGATIVE ng/mL (ref <10)
Meprobamate: NEGATIVE ng/mL (ref <2.5)
Methadone: NEGATIVE ng/mL (ref <5.0)
Nicotine Metabolite: NEGATIVE ng/mL (ref <5.0)
Opiates: NEGATIVE ng/mL (ref <2.5)
Phencyclidine: NEGATIVE ng/mL (ref <10)
Tapentadol: NEGATIVE ng/mL (ref <5.0)
Tramadol: POSITIVE ng/mL — AB (ref <5.0)
ZOLPIDEM: NEGATIVE ng/mL (ref <5.0)

## 2017-11-08 LAB — DRUG TOX ALC METAB W/CON, ORAL FLD: Alcohol Metabolite: NEGATIVE ng/mL (ref <25)

## 2017-11-09 ENCOUNTER — Telehealth: Payer: Self-pay

## 2017-11-09 ENCOUNTER — Encounter: Payer: Self-pay | Admitting: Adult Health

## 2017-11-09 ENCOUNTER — Ambulatory Visit: Payer: Medicare Other | Admitting: Adult Health

## 2017-11-09 NOTE — Telephone Encounter (Signed)
Patient no show for appointment today.

## 2017-11-09 NOTE — Progress Notes (Deleted)
PATIENT: [name] DOB: March 22, 1945  REASON FOR VISIT: follow up HISTORY FROM: patient  HISTORY OF PRESENT ILLNESS: 03/05/17 PS: [name] is a 72 year old male with CAD s/p CABG, chronic diastolic heart failure, PAF on AC, previous strokes, HTN, HLD and OSA. On 01/13/2017, pt presented to [facility] for right arm numbness and right facial droop. CT negative for acute abnormality. MRI negative for new strokes. All right sided symptoms resolved. TIA secondary to subtherapeutic INR 1.7 on warfarin for hx of atrial fibrillation. Patient offered to start Eliquis in ED but patient declined wanting to stay on warfarin. Aspirin was added to warfarin until INR became therapeutic but seems like it was never discontinued. INR currently therapeutic at 2.3 (02/12/17). Lipitor was also increased to 80mg  from 40mg  as LDL 80 on admission. He denies any recurrent stroke or TIA symptoms. He states his blood pressure is well controlled and today it is 138/96  Interval History 11/09/17:  INR 3.1 11/05/17  REVIEW OF SYSTEMS: Out of a complete 14 system review of symptoms, the patient complains only of the following symptoms: residual left side weakness from previous stroke and left food drop; and all other reviewed systems are negative.  ALLERGIES: No Known Allergies  HOME MEDICATIONS: Outpatient Medications Prior to Visit  Medication Sig Dispense Refill  . allopurinol (ZYLOPRIM) 100 MG tablet Take 200 mg by mouth daily.    Marland Kitchen atorvastatin (LIPITOR) 40 MG tablet Take 1 tablet (40 mg total) by mouth daily. 90 tablet 3  . benazepril (LOTENSIN) 10 MG tablet Take 1 tablet (10 mg total) by mouth daily. 90 tablet 3  . benazepril (LOTENSIN) 5 MG tablet TAKE 1 TABLET BY MOUTH DAILY WITH 10MG  OF BENAZEPRIL TO EQUAL 15MG  DAILY 90 tablet 2  . fluticasone (FLONASE) 50 MCG/ACT nasal spray Place 2 sprays into both nostrils daily. 9.9 g 1  . furosemide (LASIX) 40 MG tablet Take 1 tablet (40 mg total) by mouth every  other day. 45 tablet 1  . isosorbide mononitrate (IMDUR) 30 MG 24 hr tablet Take 0.5 tablets (15 mg total) by mouth daily. 15 tablet 11  . lactulose (CHRONULAC) 10 GM/15ML solution Take 15 mLs (10 g total) by mouth daily as needed for mild constipation. 240 mL 1  . levothyroxine (SYNTHROID, LEVOTHROID) 112 MCG tablet TAKE 1 TABLET (112 MCG TOTAL) BY MOUTH DAILY BEFORE BREAKFAST. 90 tablet 1  . metFORMIN (GLUCOPHAGE) 500 MG tablet TAKE 1 TABLET (500 MG TOTAL) DAILY WITH BREAKFAST BY MOUTH. 90 tablet 1  . Multiple Vitamins-Minerals (MULTIVITAMIN GUMMIES MENS PO) Take 1 tablet by mouth daily with breakfast.     . nitroGLYCERIN (NITROSTAT) 0.4 MG SL tablet Place 1 tablet (0.4 mg total) under the tongue every 5 (five) minutes as needed for chest pain. Up to 3 doses 10 tablet 2  . OXYGEN Inhale 2 L into the lungs at bedtime.    . pantoprazole (PROTONIX) 40 MG tablet Take 1 tablet (40 mg total) every morning by mouth. 90 tablet 1  . traMADol (ULTRAM) 50 MG tablet Take 1 tablet (50 mg total) by mouth 2 (two) times daily. 180 tablet 1  . traMADol (ULTRAM-ER) 200 MG 24 hr tablet Take 1 tablet (200 mg total) by mouth daily. 90 tablet 1  . vitamin B-12 1000 MCG tablet Take 1 tablet (1,000 mcg total) by mouth daily. 30 tablet 0  . warfarin (COUMADIN) 2.5 MG tablet Take as directed by coumadin clinic 90 tablet 0   No facility-administered medications prior  to visit.     PAST MEDICAL HISTORY: Past Medical History:  Diagnosis Date  . Acute encephalopathy 12/12/2015  . Acute respiratory failure with hypoxemia ([place]) 12/12/2015  . Acute respiratory failure with hypoxia ([place]) 06/17/2011  . Acute respiratory failure with hypoxia and hypercapnia ([place]) 12/12/2015  . Allergic rhinitis 02/13/2010   Qualifier: Diagnosis of  By: Wynona Luna   . Angina at rest [facility]) 10/22/2015  . Ataxia 08/10/2013  . Atrial fibrillation with RVR ([place]) 06/17/2011  . B12 deficiency 08/11/2013  . BACK PAIN, LUMBAR 01/29/2009   Qualifier:  Diagnosis of  By: Wynona Luna   . BENIGN POSITIONAL VERTIGO 11/23/2009   Qualifier: Diagnosis of  By: Wynona Luna   . CAD (coronary artery disease)    s/p CABG, s/p DES to LCX  January 2011  . CAD S/P CFX DES 2011 04/25/2015  . CALLUS, LEFT FOOT 03/21/2008   Qualifier: Diagnosis of  By: Wynona Luna   . Chest pain 04/24/2015  . Chronic diastolic heart failure ([place]) 04/24/2015  . Chronic pain    left sided-[place]  . Constipation 04/24/2015  . Coronary 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 ([place]) 11/04/2015  . CVA (cerebral infarction)    left hemiparesis  . Dark urine 04/10/2011  . Diastolic CHF ([place])   . Dizziness and giddiness 08/16/2014  . Dysrhythmia    hx of atrial fibrilation with cardioversion  . Ear pain 11/26/2010  . Essential hypertension 12/12/2006   Qualifier: Diagnosis of  By: [name] RMA, Lucy    . Fatigue 11/03/2011  . GERD 12/12/2006   Qualifier: Diagnosis of  By: [name] RMA, Lucy    . GOUT 12/12/2006   Qualifier: Diagnosis of  By: Reatha Armour, Lucy    . Hematoma 09/02/2012  . Hematoma of arm 01/26/2013  . History of stroke with residual deficit 12/12/2006   Qualifier: Diagnosis of  By: [name] RMA, Lucy    . Hx of CABG '04 05/04/2009   Qualifier: Diagnosis of  By: Mare Ferrari, [place], Sherri    . Hyperlipidemia LDL goal <70 12/12/2006   Qualifier: Diagnosis of  By: [name] RMA, Lucy     . Hypertension   . Hypogonadism male 04/10/2011  . Hypothyroidism, acquired 02/15/2013  . Insomnia 05/21/2013  . Long term current use of anticoagulant therapy 06/24/2011  . Obstructive sleep apnea-Failed CPAP 06/17/2007   Failed CPAP Using O 2 for sleep   . Other malaise and fatigue 11/17/2012  . Paroxysmal atrial fibrillation ([place]) 12/06/2016  . Persistent atrial fibrillation ([place]) 04/25/2015  . Pure hypercholesterolemia 05/01/2017  . RHINOSINUSITIS, ACUTE 12/01/2008   Qualifier: Diagnosis of  By: Wynona Luna   . RUQ abdominal pain 01/13/2017  . Sensory disturbance 08/11/2013    . Sinusitis 08/26/2013  . Sleep apnea   . SOB (shortness of breath) 10/21/2016  . Spastic hemiplegia affecting dominant side ([place]) 07/21/2011  . Stroke (cerebrum) ([place]) 01/13/2017  . Stroke ([place]) 1996  . Thoracic aorta atherosclerosis ([place]) 04/27/2017  . Tubular adenoma of colon 05/2010  . Type 2 diabetes mellitus without complication, without long-term current use of insulin ([place]) 10/05/2007   Qualifier: Diagnosis of  By: Wynona Luna   . URI (upper respiratory infection) 03/05/2012  . Vasovagal syncope   . Viral URI with cough 03/05/2014  . Weakness 11/17/2012    PAST SURGICAL HISTORY: Past Surgical History:  Procedure Laterality Date  . CARDIAC CATHETERIZATION N/A 10/25/2015  Procedure: Left Heart Cath and Cors/Grafts Angiography;  Surgeon: Troy Sine, MD;  Location: [place] CV LAB;  Service: Cardiovascular;  Laterality: N/A;  . CARDIOVERSION  06/20/2011   Procedure: CARDIOVERSION;  Surgeon: Larey Dresser, MD;  Location: [facility] ENDOSCOPY;  Service: Cardiovascular;  Laterality: N/A;  . CARDIOVERSION N/A 04/26/2015   Procedure: CARDIOVERSION;  Surgeon: Dorothy Spark, MD;  Location: [facility] ENDOSCOPY;  Service: Cardiovascular;  Laterality: N/A;  . CARDIOVERSION N/A 05/10/2015   Procedure: CARDIOVERSION;  Surgeon: Larey Dresser, MD;  Location: [place];  Service: Cardiovascular;  Laterality: N/A;  . CATARACT EXTRACTION  05/2010   left eye  . CATARACT EXTRACTION  04/2010   right eye  . CORONARY ARTERY BYPASS GRAFT     stent  . ESOPHAGOGASTRODUODENOSCOPY  03-25-2005  . NASAL SEPTUM SURGERY    . PERCUTANEOUS PLACEMENT INTRAVASCULAR STENT CERVICAL CAROTID ARTERY     03-2009; using a drug-eluting platform of the circumflex cornoray artery with a 3.0 x 18 [employer] Promus drug-eluting platform post dilated to 3.75 with a noncompliant balloon.  . TEE WITHOUT CARDIOVERSION  06/20/2011   Procedure: TRANSESOPHAGEAL ECHOCARDIOGRAM (TEE);  Surgeon: Larey Dresser, MD;  Location: [facility]  ENDOSCOPY;  Service: Cardiovascular;  Laterality: N/A;    FAMILY HISTORY: Family History  Problem Relation Age of Onset  . Lung cancer Father        deceased  . Stroke Mother        deceased-MINISTROKES    SOCIAL HISTORY: Social History   Socioeconomic History  . Marital status: Married    Spouse name: [name]  . Number of children: Not on file  . Years of education: 35  . Highest education level: Not on file  Occupational History  . 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 Financial resource strain: Not on file  . Food insecurity:    Worry: Not on file    Inability: Not on file  . Transportation needs:    Medical: Not on file    Non-medical: Not on file  Tobacco Use  . Smoking status: Never Smoker  . Smokeless tobacco: Never Used  Substance and Sexual Activity  . Alcohol use: No    Alcohol/week: 0.0 standard drinks    Comment: 1 beer a month  . Drug use: No  . Sexual activity: Never  Lifestyle  . Physical activity:    Days per week: Not on file    Minutes per session: Not on file  . Stress: Not on file  Relationships  . Social connections:    Talks on phone: Not on file    Gets together: Not on file    Attends religious service: Not on file    Active member of club or organization: Not on file    Attends meetings of clubs or organizations: Not on file    Relationship status: Not on file  . Intimate partner violence:    Fear of current or ex partner: Not on file    Emotionally abused: Not on file    Physically abused: Not on file    Forced sexual activity: Not on file  Other Topics Concern  . Not on file  Social History Narrative   Pt lives with wife. Does have stairs, but patient doesn't use them. Pt has completed technical school      PHYSICAL EXAM  There were no vitals filed for this visit. There is no height or  weight on file to calculate BMI.    Pleasant elderly Caucasian male not in distress.  Afebrile. Head is nontraumatic. Neck is supple without bruit.    Cardiac exam no murmur or gallop. Lungs are clear to auscultation. Distal pulses are well felt.  Neurological examination  Mentation: Alert oriented to time, place, history taking. Follows all commands speech and language fluent Cranial nerve II-XII: Pupils were equal round reactive to light. Extraocular movements were full, visual field were full on confrontational test. Facial sensation and strength were normal. Uvula tongue midline. Head turning and shoulder shrug  were normal and symmetric. Motor: The motor testing reveals 5 over 5 strength of all 4 extremities. Good symmetric motor tone is noted throughout.  Sensory: Sensory testing is intact to soft touch on all 4 extremities. No evidence of extinction is noted.  Coordination: Cerebellar testing reveals good finger-nose-finger and heel-to-shin bilaterally.  Gait and station: Gait noticeable left foot drop and uses walker. Tandem gait is normal.  No drift is seen.  Reflexes: Deep tendon reflexes are symmetric and normal bilaterally.   DIAGNOSTIC DATA (LABS, IMAGING, TESTING) - I reviewed patient records, labs, notes, testing and imaging myself where available.  CT ANGIO HEAD/NECK W OR WO CONTRAST 04/06/17 IMPRESSION: 1. No evidence of acute intracranial abnormality. Chronic ischemic changes as above. 2. No large vessel occlusion. Mild intracranial atherosclerosis without significant stenosis. 3. Moderate right vertebral artery origin stenosis. Moderate to severe proximal left vertebral artery stenoses. 4. Mild cervical carotid artery atherosclerosis without stenosis. 5.  Aortic Atherosclerosis (ICD10-I70.0).    Lab Results  Component Value Date   WBC 7.2 04/29/2017   HGB 13.7 04/29/2017   HCT 43.0 04/29/2017   MCV 91.9 04/29/2017   PLT 202 04/29/2017      Component Value Date/Time   NA 141 10/12/2017 1015   NA 143 09/18/2016 1158   K 4.0 10/12/2017 1015   CL  103 10/12/2017 1015   CO2 28 10/12/2017 1015   GLUCOSE 124 (H) 10/12/2017 1015   BUN 19 10/12/2017 1015   BUN 14 09/18/2016 1158   CREATININE 1.06 10/12/2017 1015   CREATININE 1.27 (H) 11/05/2015 1532   CALCIUM 9.2 10/12/2017 1015   PROT 6.6 07/13/2017 1024   PROT 6.6 09/18/2016 1158   ALBUMIN 3.7 07/13/2017 1024   ALBUMIN 4.1 09/18/2016 1158   AST 35 07/13/2017 1024   ALT 43 07/13/2017 1024   ALKPHOS 51 07/13/2017 1024   BILITOT 0.5 07/13/2017 1024   BILITOT 0.4 09/18/2016 1158   GFRNONAA 55 (L) 04/29/2017 1513   GFRNONAA 73 06/25/2012 0826   GFRAA >60 04/29/2017 1513   GFRAA 84 06/25/2012 0826   Lab Results  Component Value Date   CHOL 131 07/13/2017   HDL 45.00 07/13/2017   LDLCALC 66 07/13/2017   TRIG 97.0 07/13/2017   CHOLHDL 3 07/13/2017   Lab Results  Component Value Date   HGBA1C 6.9 (H) 10/12/2017   Lab Results  Component Value Date   KGMWNUUV25 366 12/12/2015   Lab Results  Component Value Date   TSH 1.29 07/13/2017      ASSESSMENT:  [name] is a 72 year old male with TIA on 01/13/2017 secondary to subtherapeutic INR levels on warfarin.  Vascular risk factors include prior CVA, HTN, HLD, DM, CHF and AF on AC.   PLAN: -Continue warfarin daily  and ***  for secondary stroke prevention -F/u with PCP regarding your *** management -continue to monitor BP at home  -Maintain strict control of hypertension  with blood pressure goal below 130/90, diabetes with hemoglobin A1c goal below 6.5% and cholesterol with LDL cholesterol (bad cholesterol) goal below 70 mg/dL. I also advised the patient to eat a healthy diet with plenty of whole grains, cereals, fruits and vegetables, exercise regularly and maintain ideal body weight.  Follow up in *** or call earlier if needed   Greater than 50% time during this 25 minute visit was spent on counseling and coordination of care about his TIA, atrial fibrillation and answered questions  Venancio Poisson,  AGNP-BC  [facility] [address] [place], [place] [address]  Phone [phone] Fax [phone] Note: This document was prepared with digital dictation and possible smart phrase technology. Any transcriptional errors that result from this process are unintentional.

## 2017-11-10 ENCOUNTER — Other Ambulatory Visit: Payer: Self-pay | Admitting: Family

## 2017-11-24 ENCOUNTER — Other Ambulatory Visit: Payer: Self-pay | Admitting: Physical Medicine & Rehabilitation

## 2017-11-30 ENCOUNTER — Other Ambulatory Visit: Payer: Self-pay | Admitting: Family

## 2017-12-03 ENCOUNTER — Ambulatory Visit (INDEPENDENT_AMBULATORY_CARE_PROVIDER_SITE_OTHER): Payer: Medicare Other | Admitting: *Deleted

## 2017-12-03 DIAGNOSIS — Z7901 Long term (current) use of anticoagulants: Secondary | ICD-10-CM

## 2017-12-03 DIAGNOSIS — I4891 Unspecified atrial fibrillation: Secondary | ICD-10-CM | POA: Diagnosis not present

## 2017-12-03 LAB — POCT INR: INR: 2.8 (ref 2.0–3.0)

## 2017-12-03 NOTE — Patient Instructions (Addendum)
Description   Continue taking 1 tablet everyday except 1/2 tablet on Sundays, Tuesdays and Thursdays. Recheck INR 5 weeks per pt request. Main [phone] Coumadin clinic [phone]

## 2017-12-15 ENCOUNTER — Encounter: Payer: Medicare Other | Attending: Physical Medicine & Rehabilitation

## 2017-12-15 ENCOUNTER — Encounter: Payer: Self-pay | Admitting: Physical Medicine & Rehabilitation

## 2017-12-15 ENCOUNTER — Ambulatory Visit (HOSPITAL_BASED_OUTPATIENT_CLINIC_OR_DEPARTMENT_OTHER): Payer: Medicare Other | Admitting: Physical Medicine & Rehabilitation

## 2017-12-15 VITALS — BP 169/96 | HR 68 | Resp 14 | Ht 75.0 in | Wt 265.0 lb

## 2017-12-15 DIAGNOSIS — I6523 Occlusion and stenosis of bilateral carotid arteries: Secondary | ICD-10-CM

## 2017-12-15 DIAGNOSIS — G8929 Other chronic pain: Secondary | ICD-10-CM | POA: Diagnosis not present

## 2017-12-15 DIAGNOSIS — G8110 Spastic hemiplegia affecting unspecified side: Secondary | ICD-10-CM

## 2017-12-15 DIAGNOSIS — G8111 Spastic hemiplegia affecting right dominant side: Secondary | ICD-10-CM | POA: Insufficient documentation

## 2017-12-15 DIAGNOSIS — M25562 Pain in left knee: Secondary | ICD-10-CM

## 2017-12-15 NOTE — Progress Notes (Signed)
Subjective:    Patient ID: [name], male    DOB: Jun 02, 1945, 72 y.o.   MRN: [id number]  HPI  72 year old male with right insular cortex/basal ganglia hemorrhagic infarct in 2007.  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  He also complains of pain on the left side which is chronic.  He has remained on narcotic analgesics, tramadol extended release as well as immediate release for several years.  He has had no untoward side effects of the medications.  He has had episodes of dizziness as well as feeling of weakness or tightness on the left side which have prompted repeat MRIs in 2015 which did not show any new infarct but had advanced subcortical white matter disease and cerebral atrophy, repeat MRI for similar complaints 11/25/2014 showing no new infarct and most recently MRI of the brain 01/14/2017 demonstrated no new infarcts and evidence of the prior hemorrhagic right basal ganglia infarct as well as his cortical white matter disease.  Botox 6 wks ago hamstrings 200 units  FDS 25 units, FDP 25 units,  continue FCR 50 units  Patient remains independent with bathing and dressing as well as mobility using walker Left knee stiffness  Able to write name if left elbow supported (Left hand dominant) Pain Inventory Average Pain 7 Pain Right Now 7 My pain is tingling  In the last 24 hours, has pain interfered with the following? General activity 7 Relation with others 2 Enjoyment of life 8 What TIME of day is your pain at its worst? morning Sleep (in general) Poor  Pain is worse with: inactivity and unsure Pain improves with: medication Relief from Meds: 7  Mobility walk with assistance how many minutes can you walk? 20 ability to climb steps?  yes do you drive?  yes  Function retired  Neuro/Psych trouble walking  Prior Studies Any changes since last visit?  no  Physicians involved in your care Any changes since last visit?  no   Family History    Problem Relation Age of Onset  . Lung cancer Father        deceased  . Stroke Mother        deceased-MINISTROKES   Social History   Socioeconomic History  . Marital status: Married    Spouse name: [name]  . Number of children: Not on file  . Years of education: 27  . Highest education level: Not on file  Occupational History  . 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 Financial resource strain: Not on file  . Food insecurity:    Worry: Not on file    Inability: Not on file  . Transportation needs:    Medical: Not on file    Non-medical: Not on file  Tobacco Use  . Smoking status: Never Smoker  . Smokeless tobacco: Never Used  Substance and Sexual Activity  . Alcohol use: No    Alcohol/week: 0.0 standard drinks    Comment: 1 beer a month  . Drug use: No  . Sexual activity: Never  Lifestyle  . Physical activity:    Days per week: Not on file    Minutes per session: Not on file  . Stress: Not on file  Relationships  . Social connections:    Talks on phone: Not on file    Gets together: Not on file    Attends religious service: Not on file    Active member  of club or organization: Not on file    Attends meetings of clubs or organizations: Not on file    Relationship status: Not on file  Other Topics Concern  . Not on file  Social History Narrative   Pt lives with wife. Does have stairs, but patient doesn't use them. Pt has completed technical school   Past Surgical History:  Procedure Laterality Date  . CARDIAC CATHETERIZATION N/A 10/25/2015   Procedure: Left Heart Cath and Cors/Grafts Angiography;  Surgeon: Troy Sine, MD;  Location: [facility];  Service: Cardiovascular;  Laterality: N/A;  . CARDIOVERSION  06/20/2011   Procedure: CARDIOVERSION;  Surgeon: Larey Dresser, MD;  Location: [facility] ENDOSCOPY;  Service: Cardiovascular;  Laterality: N/A;  . CARDIOVERSION N/A 04/26/2015   Procedure: CARDIOVERSION;   Surgeon: Dorothy Spark, MD;  Location: [facility] ENDOSCOPY;  Service: Cardiovascular;  Laterality: N/A;  . CARDIOVERSION N/A 05/10/2015   Procedure: CARDIOVERSION;  Surgeon: Larey Dresser, MD;  Location: [facility];  Service: Cardiovascular;  Laterality: N/A;  . CATARACT EXTRACTION  05/2010   left eye  . CATARACT EXTRACTION  04/2010   right eye  . CORONARY ARTERY BYPASS GRAFT     stent  . ESOPHAGOGASTRODUODENOSCOPY  03-25-2005  . NASAL SEPTUM SURGERY    . PERCUTANEOUS PLACEMENT INTRAVASCULAR STENT CERVICAL CAROTID ARTERY     03-2009; using a drug-eluting platform of the circumflex cornoray artery with a 3.0 x 18 [employer] Promus drug-eluting platform post dilated to 3.75 with a noncompliant balloon.  . TEE WITHOUT CARDIOVERSION  06/20/2011   Procedure: TRANSESOPHAGEAL ECHOCARDIOGRAM (TEE);  Surgeon: Larey Dresser, MD;  Location: [facility];  Service: Cardiovascular;  Laterality: N/A;   Past Medical History:  Diagnosis Date  . Acute encephalopathy 12/12/2015  . Acute respiratory failure with hypoxemia ([place]) 12/12/2015  . Acute respiratory failure with hypoxia ([place]) 06/17/2011  . Acute respiratory failure with hypoxia and hypercapnia ([place]) 12/12/2015  . Allergic rhinitis 02/13/2010   Qualifier: Diagnosis of  By: Wynona Luna   . Angina at rest [facility]) 10/22/2015  . Ataxia 08/10/2013  . Atrial fibrillation with RVR ([place]) 06/17/2011  . B12 deficiency 08/11/2013  . BACK PAIN, LUMBAR 01/29/2009   Qualifier: Diagnosis of  By: Wynona Luna   . BENIGN POSITIONAL VERTIGO 11/23/2009   Qualifier: Diagnosis of  By: Wynona Luna   . CAD (coronary artery disease)    s/p CABG, s/p DES to LCX  January 2011  . CAD S/P CFX DES 2011 04/25/2015  . CALLUS, LEFT FOOT 03/21/2008   Qualifier: Diagnosis of  By: Wynona Luna   . Chest pain 04/24/2015  . Chronic diastolic heart failure ([place]) 04/24/2015  . Chronic pain    left sided-[place]  . Constipation 04/24/2015  . Coronary artery disease of  native heart with stable angina pectoris ([place]) 11/04/2015  . CVA (cerebral infarction)    left hemiparesis  . Dark urine 04/10/2011  . Diastolic CHF ([place])   . Dizziness and giddiness 08/16/2014  . Dysrhythmia    hx of atrial fibrilation with cardioversion  . Ear pain 11/26/2010  . Essential hypertension 12/12/2006   Qualifier: Diagnosis of  By: [name] RMA, Lucy    . Fatigue 11/03/2011  . GERD 12/12/2006   Qualifier: Diagnosis of  By: [name] RMA, Lucy    . GOUT 12/12/2006   Qualifier: Diagnosis of  By: Reatha Armour, Lucy    . Hematoma 09/02/2012  . Hematoma of arm  01/26/2013  . History of stroke with residual deficit 12/12/2006   Qualifier: Diagnosis of  By: [name] RMA, Lucy    . Hx of CABG '04 05/04/2009   Qualifier: Diagnosis of  By: Mare Ferrari, [place], Sherri    . Hyperlipidemia LDL goal <70 12/12/2006   Qualifier: Diagnosis of  By: [name] RMA, Lucy     . Hypertension   . Hypogonadism male 04/10/2011  . Hypothyroidism, acquired 02/15/2013  . Insomnia 05/21/2013  . Long term current use of anticoagulant therapy 06/24/2011  . Obstructive sleep apnea-Failed CPAP 06/17/2007   Failed CPAP Using O 2 for sleep   . Other malaise and fatigue 11/17/2012  . Paroxysmal atrial fibrillation ([place]) 12/06/2016  . Persistent atrial fibrillation 04/25/2015  . Pure hypercholesterolemia 05/01/2017  . RHINOSINUSITIS, ACUTE 12/01/2008   Qualifier: Diagnosis of  By: Wynona Luna   . RUQ abdominal pain 01/13/2017  . Sensory disturbance 08/11/2013  . Sinusitis 08/26/2013  . Sleep apnea   . SOB (shortness of breath) 10/21/2016  . Spastic hemiplegia affecting dominant side ([place]) 07/21/2011  . Stroke (cerebrum) ([place]) 01/13/2017  . Stroke ([place]) 1996  . Thoracic aorta atherosclerosis ([place]) 04/27/2017  . Tubular adenoma of colon 05/2010  . Type 2 diabetes mellitus without complication, without long-term current use of insulin ([place]) 10/05/2007   Qualifier: Diagnosis of  By: Wynona Luna   . URI (upper respiratory infection) 03/05/2012    . Vasovagal syncope   . Viral URI with cough 03/05/2014  . Weakness 11/17/2012   BP (!) 169/96   Pulse 68   Resp 14   Ht 6\' 3"  (1.905 m)   Wt 265 lb (120.2 kg)   SpO2 95%   BMI 33.12 kg/m   Opioid Risk Score:   Fall Risk Score:  `1  Depression screen PHQ 2/9  Depression screen [facility] 2/9 07/28/2017 07/13/2017 05/21/2016 03/05/2015 01/01/2015 06/06/2014  Decreased Interest 0 0 0 0 3 0  Down, Depressed, Hopeless 0 0 0 0 0 0  PHQ - 2 Score 0 0 0 0 3 0  Altered sleeping - - 1 - 0 2  Tired, decreased energy - - 1 - 1 0  Change in appetite - - 0 - 0 0  Feeling bad or failure about yourself  - - 0 - 0 0  Trouble concentrating - - 0 - 0 0  Moving slowly or fidgety/restless - - 0 - 0 1  Suicidal thoughts - - 0 - 0 0  PHQ-9 Score - - 2 - 4 3  Difficult doing work/chores - - - - Not difficult at all -  Some recent data might be hidden    Review of Systems  Constitutional: Negative.   HENT: Negative.   Eyes: Negative.   Respiratory: Negative.   Cardiovascular: Negative.   Gastrointestinal: Negative.   Endocrine: Negative.   Genitourinary: Negative.   Musculoskeletal: Positive for arthralgias, gait problem and myalgias.  Skin: Negative.   Hematological: Negative.   Psychiatric/Behavioral: Negative.        Objective:   Physical Exam  Constitutional: He is oriented to person, place, and time. He appears well-developed and well-nourished. No distress.  HENT:  Head: Normocephalic and atraumatic.  Eyes: Pupils are equal, round, and reactive to light.  Musculoskeletal: Normal range of motion.  Neurological: He is alert and oriented to person, place, and time.  Left UE 3+ left delt, bi, tri, grip  LLE 4- HF, KE, Ankle DF 3-  Skin: He is  not diaphoretic.  Psychiatric: He has a normal mood and affect.  Nursing note and vitals reviewed. amb with RW  Tone MAS 1 in the left hamstring MAS 1 in the left finger flexors and MAS 2 in the elbow flexors      Assessment & Plan:  1.   Left spastic hemiparesis responsive to Botox Would recommend continue current dosing.  Repeat in 6 weeks we discussed that the Botox can relieve muscle spasticity but does not improve strength. The patient states that he was referred to neurology for complaints of some increased weakness.  I discussed with the patient that I did not detect any new weakness with him and that his functional status has not changed

## 2017-12-15 NOTE — Patient Instructions (Signed)
Diclofenac sodium topical solution What is this medicine? DICLOFENAC (dye KLOE fen ak) is a non-steroidal anti-inflammatory drug (NSAID). It is used to treat osteoarthritis of the knees. This medicine may be used for other purposes; ask your health care provider or pharmacist if you have questions. COMMON BRAND NAME(S): INFLAMMA-K, PENNSAID, VOPAC MDS What should I tell my health care provider before I take this medicine? They need to know if you have any of these conditions: -asthma -bleeding problems -coronary artery bypass graft (CABG) surgery within the past 2 weeks -heart disease -high blood pressure -if you frequently drink alcohol containing drinks -kidney disease -liver disease -open or infected skin -stomach problems -an unusual or allergic reaction to diclofenac, aspirin, other NSAIDs, other medicines, foods, dyes, or preservatives -pregnant or trying to get pregnant -breast-feeding How should I use this medicine? This medicine is for external use only. Follow the directions on the prescription label. Wash hands before and after use. Apply to the clean, dry skin of the knee. Rub around front, back, and sides of the knee. Do not apply to open wounds, infections, swelling, or areas of exfoliative dermatitis. Allow medicine to dry before using any other lotion or medicine on the same place. Do not get this medicine in your mouth or eyes. If this medicine gets in your eye, rinse out with plenty of cool tap water. Protect treatment area from sunlight and sun lamps. Use your doses at regular intervals. Do not use it more often than directed. A special MedGuide will be given to you by the pharmacist with each prescription and refill. Be sure to read this information carefully each time. Talk to your pediatrician regarding the use of this medicine in children. Special care may be needed. Overdosage: If you think you have taken too much of this medicine contact a poison control center or  emergency room at once. NOTE: This medicine is only for you. Do not share this medicine with others. What if I miss a dose? If you miss a dose, use it as soon as you can. If it is almost time for your next dose, use only that dose. Do not use double or extra doses. What may interact with this medicine? Do not take this medicine with any of the following medications: -cidofovir -ketorolac -methotrexate This medicine may also interact with the following medications: -aspirin -cyclosporine -lithium -medicines for blood pressure -medicines that treat or prevent blood clots like warfarin, enoxaparin, and dalteparin -NSAIDs, medicines for pain and inflammation, like ibuprofen or naproxen -other products used on the skin -steroid medicines like prednisone or cortisone This list may not describe all possible interactions. Give your health care provider a list of all the medicines, herbs, non-prescription drugs, or dietary supplements you use. Also tell them if you smoke, drink alcohol, or use illegal drugs. Some items may interact with your medicine. What should I watch for while using this medicine? Tell your doctor or healthcare professional if your symptoms do not start to get better or if they get worse. This medicine can make you more sensitive to the sun. Keep out of the sun. If you cannot avoid being in the sun, wear protective clothing and use sunscreen. Do not use sun lamps or tanning beds/booths. Do not take medicines such as ibuprofen and naproxen with this medicine. Side effects such as stomach upset, nausea, or ulcers may be more likely to occur. Many medicines available without a prescription should not be taken with this medicine. This medicine does not prevent  heart attack or stroke. In fact, this medicine may increase the chance of a heart attack or stroke. The chance may increase with longer use of this medicine and in people who have heart disease. If you take aspirin to prevent  heart attack or stroke, talk with your doctor or health care professional. This medicine can cause ulcers and bleeding in the stomach and intestines at any time during treatment. Ulcers and bleeding can happen without warning symptoms and can cause death. To reduce your risk, do not smoke cigarettes or drink alcohol while you are taking this medicine. This medicine can cause you to bleed more easily. Try to avoid damage to your teeth and gums when you brush or floss your teeth. What side effects may I notice from receiving this medicine? Side effects that you should report to your doctor or health care professional as soon as possible: -allergic reactions like skin rash, itching or hives, swelling of the face, lips, or tongue -black or bloody stools, blood in the urine or vomit -blurred vision -chest pain -difficulty breathing or wheezing -nausea or vomiting -redness, blistering, peeling or loosening of the skin, including inside the mouth -slurred speech or weakness on one side of the body -trouble passing urine or change in the amount of urine -unexplained weight gain or swelling -unusually weak or tired -yellowing of eyes or skin Side effects that usually do not require medical attention (report to your doctor or health care professional if they continue or are bothersome): -dizziness -dry skin -headache -increased sensitivity to the sun -stomach upset -tingling at the application This list may not describe all possible side effects. Call your doctor for medical advice about side effects. You may report side effects to FDA at 1-800-FDA-1088. Where should I keep my medicine? Keep out of the reach of children. Store at room temperature between 15 and 30 degrees C (59 and 86 degrees F). Keep container tightly closed. Throw away any unused medicine after the expiration date. NOTE: This sheet is a summary. It may not cover all possible information. If you have questions about this medicine,  talk to your doctor, pharmacist, or health care provider.  2018 Elsevier/Gold Standard (2015-04-05 09:11:01)

## 2018-01-07 ENCOUNTER — Ambulatory Visit (INDEPENDENT_AMBULATORY_CARE_PROVIDER_SITE_OTHER): Payer: Medicare Other | Admitting: *Deleted

## 2018-01-07 DIAGNOSIS — Z7901 Long term (current) use of anticoagulants: Secondary | ICD-10-CM

## 2018-01-07 DIAGNOSIS — I4891 Unspecified atrial fibrillation: Secondary | ICD-10-CM | POA: Diagnosis not present

## 2018-01-07 LAB — POCT INR: INR: 1.9 — AB (ref 2.0–3.0)

## 2018-01-07 NOTE — Patient Instructions (Signed)
Description   Today take 1 tablet, then Continue taking 1 tablet everyday except 1/2 tablet on Sundays, Tuesdays and Thursdays. Recheck INR 4 weeks. Main [phone] Coumadin clinic [phone]

## 2018-01-08 ENCOUNTER — Telehealth: Payer: Self-pay | Admitting: *Deleted

## 2018-01-08 NOTE — Telephone Encounter (Signed)
Patient came in this morning.  He thought he had an appointment.  I explained that he was not on our schedule. He was quite upset stating that he was told to come in.  I called research to see if he was on that schedule but he was not.  I told him that he had missed an appointment in August and I would be happy to get him rescheduled.  He refused stating that he would not be coming back.

## 2018-01-08 NOTE — Telephone Encounter (Signed)
Message sent to [facility] NP for [place].

## 2018-01-08 NOTE — Telephone Encounter (Signed)
Noted! Thank you

## 2018-01-12 ENCOUNTER — Other Ambulatory Visit: Payer: Self-pay | Admitting: Family

## 2018-01-26 ENCOUNTER — Encounter: Payer: Medicare Other | Attending: Physical Medicine & Rehabilitation

## 2018-01-26 ENCOUNTER — Encounter: Payer: Self-pay | Admitting: Physical Medicine & Rehabilitation

## 2018-01-26 ENCOUNTER — Ambulatory Visit: Payer: Medicare Other | Admitting: Physical Medicine & Rehabilitation

## 2018-01-26 VITALS — BP 118/80 | HR 89 | Ht 75.0 in | Wt 265.0 lb

## 2018-01-26 DIAGNOSIS — G8111 Spastic hemiplegia affecting right dominant side: Secondary | ICD-10-CM | POA: Insufficient documentation

## 2018-01-26 DIAGNOSIS — G8110 Spastic hemiplegia affecting unspecified side: Secondary | ICD-10-CM | POA: Diagnosis not present

## 2018-01-26 NOTE — Patient Instructions (Signed)

## 2018-01-26 NOTE — Progress Notes (Signed)
72 year old male with right insular cortex/basal ganglia hemorrhagic infarct in 2007.  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  Botox Injection for spasticity using needle EMG guidance  Dilution: 50 Units/ml Indication: Severe spasticity which interferes with ADL,mobility and/or  hygiene and is unresponsive to medication management and other conservative care Informed consent was obtained after describing risks and benefits of the procedure with the patient. This includes bleeding, bruising, infection, excessive weakness, or medication side effects. A REMS form is on file and signed. Needle: 27g 1" needle electrode Number of units per muscle  Left lower ext hamstrings 175 units  Left upper ext  FDS 50 units,  FDP 25 units, FCR 50 units  All injections were done after obtaining appropriate EMG activity and after negative drawback for blood. The patient tolerated the procedure well.Based on EMG activity consider focus on proximal medial Hamstring, and entire lateral Hamstring. May reduce FDS to 25U and FDP to 50 Post procedure instructions were given. A followup appointment was made.

## 2018-01-27 ENCOUNTER — Ambulatory Visit (INDEPENDENT_AMBULATORY_CARE_PROVIDER_SITE_OTHER): Payer: Medicare Other | Admitting: Family

## 2018-01-27 ENCOUNTER — Encounter: Payer: Self-pay | Admitting: Family

## 2018-01-27 VITALS — BP 138/72 | HR 69 | Temp 97.8°F | Resp 16 | Ht 75.0 in | Wt 262.0 lb

## 2018-01-27 DIAGNOSIS — J0100 Acute maxillary sinusitis, unspecified: Secondary | ICD-10-CM | POA: Diagnosis not present

## 2018-01-27 DIAGNOSIS — E039 Hypothyroidism, unspecified: Secondary | ICD-10-CM

## 2018-01-27 DIAGNOSIS — I10 Essential (primary) hypertension: Secondary | ICD-10-CM | POA: Diagnosis not present

## 2018-01-27 DIAGNOSIS — I6523 Occlusion and stenosis of bilateral carotid arteries: Secondary | ICD-10-CM | POA: Diagnosis not present

## 2018-01-27 DIAGNOSIS — E538 Deficiency of other specified B group vitamins: Secondary | ICD-10-CM

## 2018-01-27 DIAGNOSIS — E118 Type 2 diabetes mellitus with unspecified complications: Secondary | ICD-10-CM | POA: Diagnosis not present

## 2018-01-27 DIAGNOSIS — Z8739 Personal history of other diseases of the musculoskeletal system and connective tissue: Secondary | ICD-10-CM | POA: Diagnosis not present

## 2018-01-27 DIAGNOSIS — Z23 Encounter for immunization: Secondary | ICD-10-CM

## 2018-01-27 DIAGNOSIS — Z8673 Personal history of transient ischemic attack (TIA), and cerebral infarction without residual deficits: Secondary | ICD-10-CM | POA: Diagnosis not present

## 2018-01-27 LAB — VITAMIN B12: Vitamin B-12: 605 pg/mL (ref 211–911)

## 2018-01-27 LAB — COMPREHENSIVE METABOLIC PANEL
ALT: 44 U/L (ref 0–53)
AST: 40 U/L — ABNORMAL HIGH (ref 0–37)
Albumin: 4.2 g/dL (ref 3.5–5.2)
Alkaline Phosphatase: 60 U/L (ref 39–117)
BUN: 20 mg/dL (ref 6–23)
CO2: 32 meq/L (ref 19–32)
Calcium: 9.5 mg/dL (ref 8.4–10.5)
Chloride: 102 mEq/L (ref 96–112)
Creatinine, Ser: 1.05 mg/dL (ref 0.40–1.50)
GFR: 73.24 mL/min (ref >60.00)
GLUCOSE: 158 mg/dL — AB (ref 70–99)
POTASSIUM: 3.9 meq/L (ref 3.5–5.1)
Sodium: 139 mEq/L (ref 135–145)
Total Bilirubin: 0.6 mg/dL (ref 0.2–1.2)
Total Protein: 6.9 g/dL (ref 6.0–8.3)

## 2018-01-27 LAB — HEMOGLOBIN A1C: Hgb A1c MFr Bld: 6.7 % — ABNORMAL HIGH (ref 4.6–6.5)

## 2018-01-27 LAB — TSH: TSH: 1.47 u[IU]/mL (ref 0.35–4.50)

## 2018-01-27 MED ORDER — AMOXICILLIN-POT CLAVULANATE 875-125 MG PO TABS: 1.0000 | ORAL_TABLET | Freq: Two times a day (BID) | ORAL | 0 refills | Status: DC

## 2018-01-27 NOTE — Patient Instructions (Addendum)
Please contact your oral surgeon to follow up on the gum healing where the wisdom tooth was extracted.   Start augmentin for your sinus infection- let me know if symptoms worsen or if not improved in 4-5 days.  Please complete lab work prior to leaving.

## 2018-01-27 NOTE — Progress Notes (Signed)
Subjective:    Patient ID: [name], male    DOB: 1945-03-30, 72 y.o.   MRN: [id number]  HPI  Patient is a 72 year old male who presents today for routine follow-up.  Hypertension-he is maintained on benazepril 15 mg and imdur.   Hypothyroid-she continue Synthroid 112 mcg once daily. Lab Results  Component Value Date   TSH 1.29 07/13/2017   Diabetes type 2- maintained on metformin 500 mg once daily with breakfast. Does not check his sugars at home.  Lab Results  Component Value Date   HGBA1C 6.9 (H) 10/12/2017   HGBA1C 6.7 (H) 07/13/2017   HGBA1C 6.2 (H) 01/14/2017   Lab Results  Component Value Date   MICROALBUR <0.7 09/26/2014   LDLCALC 66 07/13/2017   CREATININE 1.06 10/12/2017    Hyperlipidemia- he is maintained on atorvastatin 40 mg once daily. Lab Results  Component Value Date   CHOL 131 07/13/2017   HDL 45.00 07/13/2017   LDLCALC 66 07/13/2017   TRIG 97.0 07/13/2017   CHOLHDL 3 07/13/2017   History of CVA-the patient is maintained on Coumadin.  B12 deficiency- on an otc vitamin.  Takes 2 tabs daily.   gerd- maintained on protonix.  Reports rare symptoms with protonix.   Reports he has had some sinus congestion which is "running through the house."  Reports that he had a wisdom tooth pulled a few weeks ago.  Reports that the gum  Doesn't seem to want to close. Reports that symptoms have been present x 4-5 weeks. Reports that he has pressure in his forehead.     Review of Systems    see HPI  Past Medical History:  Diagnosis Date  . Acute encephalopathy 12/12/2015  . Acute respiratory failure with hypoxemia ([place]) 12/12/2015  . Acute respiratory failure with hypoxia ([place]) 06/17/2011  . Acute respiratory failure with hypoxia and hypercapnia ([place]) 12/12/2015  . Allergic rhinitis 02/13/2010   Qualifier: Diagnosis of  By: Wynona Luna   . Angina at rest [facility]) 10/22/2015  . Ataxia 08/10/2013  . Atrial fibrillation with RVR ([place]) 06/17/2011  . B12  deficiency 08/11/2013  . BACK PAIN, LUMBAR 01/29/2009   Qualifier: Diagnosis of  By: Wynona Luna   . BENIGN POSITIONAL VERTIGO 11/23/2009   Qualifier: Diagnosis of  By: Wynona Luna   . CAD (coronary artery disease)    s/p CABG, s/p DES to LCX  January 2011  . CAD S/P CFX DES 2011 04/25/2015  . CALLUS, LEFT FOOT 03/21/2008   Qualifier: Diagnosis of  By: Wynona Luna   . Chest pain 04/24/2015  . Chronic diastolic heart failure ([place]) 04/24/2015  . Chronic pain    left sided-[place]  . Constipation 04/24/2015  . Coronary 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 ([place] [address]) 11/04/2015  . CVA (cerebral infarction)    left hemiparesis  . Dark urine 04/10/2011  . Diastolic CHF ([place])   . Dizziness and giddiness 08/16/2014  . Dysrhythmia    hx of atrial fibrilation with cardioversion  . Ear pain 11/26/2010  . Essential hypertension 12/12/2006   Qualifier: Diagnosis of  By: [name] RMA, Lucy    . Fatigue 11/03/2011  . GERD 12/12/2006   Qualifier: Diagnosis of  By: [name] RMA, Lucy    . GOUT 12/12/2006   Qualifier: Diagnosis of  By: Reatha Armour, Lucy    . Hematoma 09/02/2012  . Hematoma of arm 01/26/2013  . History of stroke with residual deficit  12/12/2006   Qualifier: Diagnosis of  By: Reatha Armour, Lorre Nick    . Hx of CABG '04 05/04/2009   Qualifier: Diagnosis of  By: Mare Ferrari, [place], Sherri    . Hyperlipidemia LDL goal <70 12/12/2006   Qualifier: Diagnosis of  By: [name] RMA, Lucy     . Hypertension   . Hypogonadism male 04/10/2011  . Hypothyroidism, acquired 02/15/2013  . Insomnia 05/21/2013  . Long term current use of anticoagulant therapy 06/24/2011  . Obstructive sleep apnea-Failed CPAP 06/17/2007   Failed CPAP Using O 2 for sleep   . Other malaise and fatigue 11/17/2012  . Paroxysmal atrial fibrillation ([place]) 12/06/2016  . Persistent atrial fibrillation 04/25/2015  . Pure hypercholesterolemia 05/01/2017  . RHINOSINUSITIS, ACUTE 12/01/2008   Qualifier: Diagnosis of  By: Wynona Luna   .  RUQ abdominal pain 01/13/2017  . Sensory disturbance 08/11/2013  . Sinusitis 08/26/2013  . Sleep apnea   . SOB (shortness of breath) 10/21/2016  . Spastic hemiplegia affecting dominant side ([place]) 07/21/2011  . Stroke (cerebrum) ([place]) 01/13/2017  . Stroke ([place]) 1996  . Thoracic aorta atherosclerosis ([place]) 04/27/2017  . Tubular adenoma of colon 05/2010  . Type 2 diabetes mellitus without complication, without long-term current use of insulin ([place]) 10/05/2007   Qualifier: Diagnosis of  By: Wynona Luna   . URI (upper respiratory infection) 03/05/2012  . Vasovagal syncope   . Viral URI with cough 03/05/2014  . Weakness 11/17/2012     Social History   Socioeconomic History  . Marital status: Married    Spouse name: [name]  . Number of children: Not on file  . Years of education: 21  . Highest education level: Not on file  Occupational History  . 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 Financial resource strain: Not on file  . Food insecurity:    Worry: Not on file    Inability: Not on file  . Transportation needs:    Medical: Not on file    Non-medical: Not on file  Tobacco Use  . Smoking status: Never Smoker  . Smokeless tobacco: Never Used  Substance and Sexual Activity  . Alcohol use: No    Alcohol/week: 0.0 standard drinks    Comment: 1 beer a month  . Drug use: No  . Sexual activity: Never  Lifestyle  . Physical activity:    Days per week: Not on file    Minutes per session: Not on file  . Stress: Not on file  Relationships  . Social connections:    Talks on phone: Not on file    Gets together: Not on file    Attends religious service: Not on file    Active member of club or organization: Not on file    Attends meetings of clubs or organizations: Not on file    Relationship status: Not on file  . Intimate partner violence:    Fear of current or ex partner: Not on file    Emotionally abused: Not on file     Physically abused: Not on file    Forced sexual activity: Not on file  Other Topics Concern  . Not on file  Social History Narrative   Pt lives with wife. Does have stairs, but patient doesn't use them. Pt has completed technical school    Past Surgical History:  Procedure Laterality Date  . CARDIAC CATHETERIZATION N/A 10/25/2015   Procedure: Left Heart  Cath and Cors/Grafts Angiography;  Surgeon: Troy Sine, MD;  Location: [facility];  Service: Cardiovascular;  Laterality: N/A;  . CARDIOVERSION  06/20/2011   Procedure: CARDIOVERSION;  Surgeon: Larey Dresser, MD;  Location: [facility] ENDOSCOPY;  Service: Cardiovascular;  Laterality: N/A;  . CARDIOVERSION N/A 04/26/2015   Procedure: CARDIOVERSION;  Surgeon: Dorothy Spark, MD;  Location: [facility] ENDOSCOPY;  Service: Cardiovascular;  Laterality: N/A;  . CARDIOVERSION N/A 05/10/2015   Procedure: CARDIOVERSION;  Surgeon: Larey Dresser, MD;  Location: [place];  Service: Cardiovascular;  Laterality: N/A;  . CATARACT EXTRACTION  05/2010   left eye  . CATARACT EXTRACTION  04/2010   right eye  . CORONARY ARTERY BYPASS GRAFT     stent  . ESOPHAGOGASTRODUODENOSCOPY  03-25-2005  . NASAL SEPTUM SURGERY    . PERCUTANEOUS PLACEMENT INTRAVASCULAR STENT CERVICAL CAROTID ARTERY     03-2009; using a drug-eluting platform of the circumflex cornoray artery with a 3.0 x 18 [employer] Promus drug-eluting platform post dilated to 3.75 with a noncompliant balloon.  . TEE WITHOUT CARDIOVERSION  06/20/2011   Procedure: TRANSESOPHAGEAL ECHOCARDIOGRAM (TEE);  Surgeon: Larey Dresser, MD;  Location[facility] ENDOSCOPY;  Service: Cardiovascular;  Laterality: N/A;    Family History  Problem Relation Age of Onset  . Lung cancer Father        deceased  . Stroke Mother        deceased-MINISTROKES    No Known Allergies  Current Outpatient Medications on File Prior to Visit  Medication Sig Dispense Refill  . allopurinol (ZYLOPRIM) 100 MG tablet TAKE 2 TABLETS  EVERY DAY 180 tablet 0  . atorvastatin (LIPITOR) 40 MG tablet Take 1 tablet (40 mg total) by mouth daily. 90 tablet 3  . benazepril (LOTENSIN) 10 MG tablet Take 1 tablet (10 mg total) by mouth daily. 90 tablet 3  . benazepril (LOTENSIN) 5 MG tablet TAKE 1 TABLET BY MOUTH DAILY WITH 10MG  OF BENAZEPRIL TO EQUAL 15MG  DAILY 90 tablet 2  . fluticasone (FLONASE) 50 MCG/ACT nasal spray Place 2 sprays into both nostrils daily. 9.9 g 1  . furosemide (LASIX) 40 MG tablet TAKE 1 TABLET BY MOUTH EVERY OTHER DAY 45 tablet 0  . isosorbide mononitrate (IMDUR) 30 MG 24 hr tablet Take 0.5 tablets (15 mg total) by mouth daily. 15 tablet 11  . lactulose (CHRONULAC) 10 GM/15ML solution Take 15 mLs (10 g total) by mouth daily as needed for mild constipation. 240 mL 1  . levothyroxine (SYNTHROID, LEVOTHROID) 112 MCG tablet TAKE 1 TABLET (112 MCG TOTAL) BY MOUTH DAILY BEFORE BREAKFAST. 90 tablet 1  . metFORMIN (GLUCOPHAGE) 500 MG tablet TAKE 1 TABLET (500 MG TOTAL) DAILY WITH BREAKFAST BY MOUTH. 90 tablet 1  . Multiple Vitamins-Minerals (MULTIVITAMIN GUMMIES MENS PO) Take 1 tablet by mouth daily with breakfast.     . nitroGLYCERIN (NITROSTAT) 0.4 MG SL tablet Place 1 tablet (0.4 mg total) under the tongue every 5 (five) minutes as needed for chest pain. Up to 3 doses 10 tablet 2  . OXYGEN Inhale 2 L into the lungs at bedtime.    . pantoprazole (PROTONIX) 40 MG tablet TAKE 1 TABLET (40 MG TOTAL) EVERY MORNING BY MOUTH. 90 tablet 1  . traMADol (ULTRAM) 50 MG tablet Take 1 tablet (50 mg total) by mouth 2 (two) times daily. 180 tablet 1  . traMADol (ULTRAM-ER) 200 MG 24 hr tablet TAKE 1 TABLET EVERY DAY 90 tablet 1  . vitamin B-12 1000 MCG tablet  Take 1 tablet (1,000 mcg total) by mouth daily. 30 tablet 0  . warfarin (COUMADIN) 2.5 MG tablet Take as directed by coumadin clinic 90 tablet 0   No current facility-administered medications on file prior to visit.     BP 138/72 (BP Location: Right Arm, Patient Position:  Sitting, Cuff Size: Large)   Pulse 69   Temp 97.8 F (36.6 C) (Oral)   Resp 16   Ht 6\' 3"  (1.905 m)   Wt 262 lb (118.8 kg)   SpO2 97%   BMI 32.75 kg/m    Objective:   Physical Exam  Constitutional: He is oriented to person, place, and time. He appears well-developed and well-nourished. No distress.  HENT:  Head: Normocephalic and atraumatic.  Right Ear: Tympanic membrane and ear canal normal.  Left Ear: Tympanic membrane and ear canal normal.  Nose: Right sinus exhibits no frontal sinus tenderness. Left sinus exhibits maxillary sinus tenderness. Left sinus exhibits no frontal sinus tenderness.  Mouth/Throat: No oropharyngeal exudate, posterior oropharyngeal edema or posterior oropharyngeal erythema.  Right posterior lower gumline, some white granulation tissue noted in removal site of molar  Cardiovascular: Normal rate and regular rhythm.  No murmur heard. Pulmonary/Chest: Effort normal and breath sounds normal. No respiratory distress. He has no wheezes. He has no rales.  Musculoskeletal: He exhibits no edema.  Neurological: He is alert and oriented to person, place, and time.  Skin: Skin is warm and dry.  Psychiatric: He has a normal mood and affect. His behavior is normal. Thought content normal.          Assessment & Plan:  HTN-blood pressure stable.  Continue current medications.  Diabetes type 2-clinically stable on metformin will obtain follow-up A1c.  History of CVA- continue Coumadin.  Management per Coumadin clinic.  Hypothyroid-clinically stable on Synthroid.  Will obtain follow-up TSH.  History of gout- clinically stable on allopurinol.  Continue same.  B12 deficiency-maintained on oral supplement.  Will check follow-up level to ensure adequate supplementation.  Sinusitis-will Rx with Augmentin.

## 2018-02-04 ENCOUNTER — Ambulatory Visit (INDEPENDENT_AMBULATORY_CARE_PROVIDER_SITE_OTHER): Payer: Medicare Other | Admitting: *Deleted

## 2018-02-04 DIAGNOSIS — I4891 Unspecified atrial fibrillation: Secondary | ICD-10-CM

## 2018-02-04 DIAGNOSIS — Z7901 Long term (current) use of anticoagulants: Secondary | ICD-10-CM | POA: Diagnosis not present

## 2018-02-04 LAB — POCT INR: INR: 2.3 (ref 2.0–3.0)

## 2018-02-04 NOTE — Patient Instructions (Signed)
Description   Continue taking 1 tablet everyday except 1/2 tablet on Sundays, Tuesdays and Thursdays. Recheck INR 4 weeks. Main [phone] Coumadin clinic [phone]

## 2018-02-16 ENCOUNTER — Ambulatory Visit: Payer: Self-pay

## 2018-02-16 NOTE — Telephone Encounter (Signed)
Patient called in with c/o "heart beating problems." He says "I got sick after Thanksgiving with abdominal pain and I was vomiting. All the vomiting has put my heart in an irregular rhythm and I need to come to have my heart put back in rhythm. I don't know if I should go to [facility] because I already ate and they will not do a cardioversion on me since I ate." I asked the patient is his heart beating fast, he says "I can't feel it because of the stroke and numbness I have. But, I can tell it's not beating enough to get the good blood flow, because I can't do anything without being winded." I asked is he short of breath, he says "yes, I am." I can hear the patient as he is talking, he's stopping and breathing heavy. I advised that there are medications that can be given to slow the heart down without going straight to a cardioversion. I advised that if he is having the symptoms of irregular heart beat and SOB, he should go to the ED for evaluation. Patient verbalized understanding and says he will go to the ED.  Reason for Disposition . New or worsened shortness of breath with activity (dyspnea on exertion)  Answer Assessment - Initial Assessment Questions 1. DESCRIPTION: "Please describe your heart rate or heart beat that you are having" (e.g., fast/slow, regular/irregular, skipped or extra beats, "palpitations")     My heart is beating out of rhythm 2. CAUSE: "What do you think is causing the palpitations?"     I was vomiting and threw my heart out of rhythm 3. CARDIAC HISTORY: "Do you have any history of heart disease?" (e.g., heart attack, angina, bypass surgery, angioplasty, arrhythmia)      Yes; I had a cardioversion before 4. OTHER SYMPTOMS: "Do you have any other symptoms?" (e.g., dizziness, chest pain, sweating, difficulty breathing)       Difficulty breathing, abdominal pain  Protocols used: HEART RATE AND HEARTBEAT QUESTIONS-A-AH

## 2018-02-17 ENCOUNTER — Other Ambulatory Visit: Payer: Self-pay

## 2018-02-17 ENCOUNTER — Emergency Department (HOSPITAL_COMMUNITY): Payer: Medicare Other

## 2018-02-17 ENCOUNTER — Inpatient Hospital Stay (HOSPITAL_COMMUNITY)
Admission: EM | Admit: 2018-02-17 | Discharge: 2018-02-20 | DRG: 287 | Disposition: A | Discharge status: Home / Self Care | Payer: Medicare Other | Attending: Internal Medicine | Admitting: Internal Medicine

## 2018-02-17 ENCOUNTER — Encounter (HOSPITAL_COMMUNITY): Payer: Self-pay

## 2018-02-17 DIAGNOSIS — Z7901 Long term (current) use of anticoagulants: Secondary | ICD-10-CM

## 2018-02-17 DIAGNOSIS — G4733 Obstructive sleep apnea (adult) (pediatric): Secondary | ICD-10-CM | POA: Diagnosis present

## 2018-02-17 DIAGNOSIS — R079 Chest pain, unspecified: Secondary | ICD-10-CM

## 2018-02-17 DIAGNOSIS — J309 Allergic rhinitis, unspecified: Secondary | ICD-10-CM | POA: Diagnosis present

## 2018-02-17 DIAGNOSIS — I2510 Atherosclerotic heart disease of native coronary artery without angina pectoris: Secondary | ICD-10-CM | POA: Diagnosis not present

## 2018-02-17 DIAGNOSIS — I4819 Other persistent atrial fibrillation: Secondary | ICD-10-CM | POA: Diagnosis not present

## 2018-02-17 DIAGNOSIS — I4811 Longstanding persistent atrial fibrillation: Secondary | ICD-10-CM | POA: Diagnosis present

## 2018-02-17 DIAGNOSIS — I2583 Coronary atherosclerosis due to lipid rich plaque: Secondary | ICD-10-CM

## 2018-02-17 DIAGNOSIS — R0602 Shortness of breath: Secondary | ICD-10-CM | POA: Diagnosis not present

## 2018-02-17 DIAGNOSIS — I69328 Other speech and language deficits following cerebral infarction: Secondary | ICD-10-CM

## 2018-02-17 DIAGNOSIS — Z9841 Cataract extraction status, right eye: Secondary | ICD-10-CM

## 2018-02-17 DIAGNOSIS — I69354 Hemiplegia and hemiparesis following cerebral infarction affecting left non-dominant side: Secondary | ICD-10-CM

## 2018-02-17 DIAGNOSIS — Z955 Presence of coronary angioplasty implant and graft: Secondary | ICD-10-CM

## 2018-02-17 DIAGNOSIS — R0609 Other forms of dyspnea: Secondary | ICD-10-CM

## 2018-02-17 DIAGNOSIS — I25110 Atherosclerotic heart disease of native coronary artery with unstable angina pectoris: Secondary | ICD-10-CM | POA: Diagnosis not present

## 2018-02-17 DIAGNOSIS — I5032 Chronic diastolic (congestive) heart failure: Secondary | ICD-10-CM | POA: Diagnosis present

## 2018-02-17 DIAGNOSIS — Z951 Presence of aortocoronary bypass graft: Secondary | ICD-10-CM

## 2018-02-17 DIAGNOSIS — E785 Hyperlipidemia, unspecified: Secondary | ICD-10-CM | POA: Diagnosis present

## 2018-02-17 DIAGNOSIS — Z79891 Long term (current) use of opiate analgesic: Secondary | ICD-10-CM

## 2018-02-17 DIAGNOSIS — Z7984 Long term (current) use of oral hypoglycemic drugs: Secondary | ICD-10-CM

## 2018-02-17 DIAGNOSIS — Z95828 Presence of other vascular implants and grafts: Secondary | ICD-10-CM

## 2018-02-17 DIAGNOSIS — Z9842 Cataract extraction status, left eye: Secondary | ICD-10-CM

## 2018-02-17 DIAGNOSIS — M109 Gout, unspecified: Secondary | ICD-10-CM | POA: Diagnosis present

## 2018-02-17 DIAGNOSIS — I6930 Unspecified sequelae of cerebral infarction: Secondary | ICD-10-CM

## 2018-02-17 DIAGNOSIS — Z7951 Long term (current) use of inhaled steroids: Secondary | ICD-10-CM

## 2018-02-17 DIAGNOSIS — I110 Hypertensive heart disease with heart failure: Secondary | ICD-10-CM | POA: Diagnosis present

## 2018-02-17 DIAGNOSIS — Z79890 Hormone replacement therapy (postmenopausal): Secondary | ICD-10-CM

## 2018-02-17 DIAGNOSIS — I10 Essential (primary) hypertension: Secondary | ICD-10-CM

## 2018-02-17 DIAGNOSIS — R001 Bradycardia, unspecified: Secondary | ICD-10-CM | POA: Diagnosis not present

## 2018-02-17 DIAGNOSIS — I480 Paroxysmal atrial fibrillation: Secondary | ICD-10-CM | POA: Diagnosis not present

## 2018-02-17 DIAGNOSIS — R42 Dizziness and giddiness: Secondary | ICD-10-CM | POA: Diagnosis not present

## 2018-02-17 DIAGNOSIS — R791 Abnormal coagulation profile: Secondary | ICD-10-CM | POA: Diagnosis present

## 2018-02-17 DIAGNOSIS — E039 Hypothyroidism, unspecified: Secondary | ICD-10-CM | POA: Diagnosis present

## 2018-02-17 DIAGNOSIS — I209 Angina pectoris, unspecified: Secondary | ICD-10-CM | POA: Diagnosis not present

## 2018-02-17 DIAGNOSIS — Z79899 Other long term (current) drug therapy: Secondary | ICD-10-CM

## 2018-02-17 DIAGNOSIS — Z5181 Encounter for therapeutic drug level monitoring: Secondary | ICD-10-CM

## 2018-02-17 DIAGNOSIS — K219 Gastro-esophageal reflux disease without esophagitis: Secondary | ICD-10-CM | POA: Diagnosis present

## 2018-02-17 DIAGNOSIS — R0600 Dyspnea, unspecified: Secondary | ICD-10-CM

## 2018-02-17 DIAGNOSIS — I25810 Atherosclerosis of coronary artery bypass graft(s) without angina pectoris: Secondary | ICD-10-CM | POA: Diagnosis not present

## 2018-02-17 DIAGNOSIS — Z823 Family history of stroke: Secondary | ICD-10-CM

## 2018-02-17 DIAGNOSIS — Z9981 Dependence on supplemental oxygen: Secondary | ICD-10-CM

## 2018-02-17 DIAGNOSIS — E291 Testicular hypofunction: Secondary | ICD-10-CM | POA: Diagnosis present

## 2018-02-17 DIAGNOSIS — Z801 Family history of malignant neoplasm of trachea, bronchus and lung: Secondary | ICD-10-CM

## 2018-02-17 DIAGNOSIS — G4700 Insomnia, unspecified: Secondary | ICD-10-CM | POA: Diagnosis present

## 2018-02-17 DIAGNOSIS — I700 Atherosclerosis of aorta: Secondary | ICD-10-CM | POA: Diagnosis present

## 2018-02-17 DIAGNOSIS — G8929 Other chronic pain: Secondary | ICD-10-CM | POA: Diagnosis present

## 2018-02-17 DIAGNOSIS — E119 Type 2 diabetes mellitus without complications: Secondary | ICD-10-CM

## 2018-02-17 LAB — CBC
HEMATOCRIT: 43.2 % (ref 39.0–52.0)
Hemoglobin: 13.5 g/dL (ref 13.0–17.0)
MCH: 29 pg (ref 26.0–34.0)
MCHC: 31.3 g/dL (ref 30.0–36.0)
MCV: 92.7 fL (ref 80.0–100.0)
NRBC: 0 % (ref 0.0–0.2)
Platelets: 172 10*3/uL (ref 150–400)
RBC: 4.66 MIL/uL (ref 4.22–5.81)
RDW: 13.3 % (ref 11.5–15.5)
WBC: 6.3 10*3/uL (ref 4.0–10.5)

## 2018-02-17 LAB — BASIC METABOLIC PANEL
ANION GAP: 12 (ref 5–15)
BUN: 16 mg/dL (ref 8–23)
CHLORIDE: 101 mmol/L (ref 98–111)
CO2: 27 mmol/L (ref 22–32)
Calcium: 9.3 mg/dL (ref 8.9–10.3)
Creatinine, Ser: 1.22 mg/dL (ref 0.61–1.24)
GFR calc Af Amer: >60 mL/min (ref >60)
GFR, EST NON AFRICAN AMERICAN: 58 mL/min — AB (ref >60)
Glucose, Bld: 138 mg/dL — ABNORMAL HIGH (ref 70–99)
Potassium: 4.1 mmol/L (ref 3.5–5.1)
SODIUM: 140 mmol/L (ref 135–145)

## 2018-02-17 LAB — I-STAT TROPONIN, ED
TROPONIN I, POC: 0.06 ng/mL (ref 0.00–0.08)
TROPONIN I, POC: 0.04 ng/mL (ref 0.00–0.08)

## 2018-02-17 LAB — TROPONIN I
TROPONIN I: 0.04 ng/mL — AB (ref <0.03)
Troponin I: 0.04 ng/mL (ref <0.03)

## 2018-02-17 LAB — PROTIME-INR
INR: 2.01
PROTHROMBIN TIME: 22.5 s — AB (ref 11.4–15.2)

## 2018-02-17 LAB — GLUCOSE, CAPILLARY
Glucose-Capillary: 141 mg/dL — ABNORMAL HIGH (ref 70–99)
Glucose-Capillary: 117 mg/dL — ABNORMAL HIGH (ref 70–99)

## 2018-02-17 LAB — MRSA PCR SCREENING: MRSA by PCR: NEGATIVE

## 2018-02-17 MED ORDER — SODIUM CHLORIDE 0.9 % IV SOLN: 250.0000 mL | INTRAVENOUS | Status: DC | PRN

## 2018-02-17 MED ORDER — BENAZEPRIL HCL 10 MG PO TABS: 10.0000 mg | ORAL_TABLET | Freq: Every day | ORAL | Status: DC

## 2018-02-17 MED ORDER — SODIUM CHLORIDE 0.9% FLUSH
3.0000 mL | INTRAVENOUS | Status: DC | PRN
  Administered 2018-02-18: 3 mL via INTRAVENOUS
  Filled 2018-02-17: qty 3

## 2018-02-17 MED ORDER — SODIUM CHLORIDE 0.9% FLUSH
3.0000 mL | Freq: Two times a day (BID) | INTRAVENOUS | Status: DC
  Administered 2018-02-17 – 2018-02-19 (×4): 3 mL via INTRAVENOUS

## 2018-02-17 MED ORDER — SODIUM CHLORIDE 0.9% FLUSH
3.0000 mL | Freq: Two times a day (BID) | INTRAVENOUS | Status: DC
  Administered 2018-02-17 – 2018-02-18 (×2): 3 mL via INTRAVENOUS

## 2018-02-17 MED ORDER — INSULIN ASPART 100 UNIT/ML ~~LOC~~ SOLN
0.0000 [IU] | Freq: Three times a day (TID) | SUBCUTANEOUS | Status: DC
  Administered 2018-02-17: 2 [IU] via SUBCUTANEOUS

## 2018-02-17 MED ORDER — NITROGLYCERIN 0.4 MG SL SUBL: 0.4000 mg | SUBLINGUAL_TABLET | SUBLINGUAL | Status: DC | PRN

## 2018-02-17 MED ORDER — ALLOPURINOL 100 MG PO TABS
200.0000 mg | ORAL_TABLET | Freq: Every day | ORAL | Status: DC
  Administered 2018-02-18 – 2018-02-20 (×3): 200 mg via ORAL
  Filled 2018-02-17 (×3): qty 2

## 2018-02-17 MED ORDER — ATORVASTATIN CALCIUM 80 MG PO TABS
80.0000 mg | ORAL_TABLET | Freq: Every day | ORAL | Status: DC
  Filled 2018-02-17: qty 1

## 2018-02-17 MED ORDER — ACETAMINOPHEN 325 MG PO TABS: 650.0000 mg | ORAL_TABLET | ORAL | Status: DC | PRN

## 2018-02-17 MED ORDER — ISOSORBIDE MONONITRATE ER 30 MG PO TB24
15.0000 mg | ORAL_TABLET | Freq: Every day | ORAL | Status: DC
  Administered 2018-02-18 – 2018-02-20 (×3): 15 mg via ORAL
  Filled 2018-02-17 (×3): qty 1

## 2018-02-17 MED ORDER — ASPIRIN 81 MG PO CHEW: 81.0000 mg | CHEWABLE_TABLET | ORAL | Status: AC

## 2018-02-17 MED ORDER — METOPROLOL TARTRATE 12.5 MG HALF TABLET
12.5000 mg | ORAL_TABLET | Freq: Two times a day (BID) | ORAL | Status: DC
  Administered 2018-02-17 – 2018-02-20 (×6): 12.5 mg via ORAL
  Filled 2018-02-17 (×6): qty 1

## 2018-02-17 MED ORDER — SODIUM CHLORIDE 0.9 % WEIGHT BASED INFUSION: 1.0000 mL/kg/h | INTRAVENOUS | Status: DC

## 2018-02-17 MED ORDER — PANTOPRAZOLE SODIUM 40 MG PO TBEC
40.0000 mg | DELAYED_RELEASE_TABLET | Freq: Every morning | ORAL | Status: DC
  Administered 2018-02-18: 40 mg via ORAL
  Filled 2018-02-17: qty 1

## 2018-02-17 MED ORDER — ASPIRIN EC 81 MG PO TBEC
81.0000 mg | DELAYED_RELEASE_TABLET | Freq: Every day | ORAL | Status: DC
  Administered 2018-02-18 – 2018-02-20 (×3): 81 mg via ORAL
  Filled 2018-02-17 (×3): qty 1

## 2018-02-17 MED ORDER — ONDANSETRON HCL 4 MG/2ML IJ SOLN: 4.0000 mg | Freq: Four times a day (QID) | INTRAMUSCULAR | Status: DC | PRN

## 2018-02-17 MED ORDER — LEVOTHYROXINE SODIUM 112 MCG PO TABS
112.0000 ug | ORAL_TABLET | Freq: Every day | ORAL | Status: DC
  Administered 2018-02-18 – 2018-02-20 (×3): 112 ug via ORAL
  Filled 2018-02-17 (×3): qty 1

## 2018-02-17 MED ORDER — TRAMADOL HCL ER 200 MG PO TB24: 200.0000 mg | ORAL_TABLET | Freq: Every day | ORAL | Status: DC

## 2018-02-17 MED ORDER — BENAZEPRIL HCL 5 MG PO TABS: 5.0000 mg | ORAL_TABLET | Freq: Every day | ORAL | Status: DC

## 2018-02-17 MED ORDER — SODIUM CHLORIDE 0.9 % WEIGHT BASED INFUSION
3.0000 mL/kg/h | INTRAVENOUS | Status: DC
  Administered 2018-02-18: 3 mL/kg/h via INTRAVENOUS

## 2018-02-17 MED ORDER — BENAZEPRIL HCL 5 MG PO TABS
15.0000 mg | ORAL_TABLET | Freq: Every day | ORAL | Status: DC
  Administered 2018-02-18: 15 mg via ORAL
  Filled 2018-02-17 (×2): qty 1

## 2018-02-17 MED ORDER — TRAMADOL HCL 50 MG PO TABS
50.0000 mg | ORAL_TABLET | Freq: Two times a day (BID) | ORAL | Status: DC
  Administered 2018-02-17 – 2018-02-20 (×6): 50 mg via ORAL
  Filled 2018-02-17 (×6): qty 1

## 2018-02-17 MED ORDER — MORPHINE SULFATE (PF) 2 MG/ML IV SOLN: 2.0000 mg | INTRAVENOUS | Status: DC | PRN

## 2018-02-17 NOTE — ED Provider Notes (Addendum)
[facility] EMERGENCY DEPARTMENT Provider Note   CSN: [id number] Arrival date & time: 02/17/18  5809     History   Chief Complaint Chief Complaint  Patient presents with  . Shortness of Breath  . Chest Pain    HPI [name] is a 72 y.o. male.  Patient with history of coronary artery disease, ataxia who presents to the ED with fatigue, chest pain.  Patient mostly with decreased energy over the last several days.  Has a significant cardiac history and concerned that may be he has had atrial fibrillation again.  Patient states that he is usually had his heart issues without severe chest pain and mostly from general fatigue like he has now.  Had a stress test back in February.  Denies any shortness of breath, abdominal pain.  Ambulates with a walker at baseline.  The history is provided by the patient.  Chest Pain   This is a new problem. The current episode started more than 2 days ago. The problem occurs daily. The problem has not changed since onset.The pain is associated with rest. The pain is present in the lateral region. The pain is at a severity of 2/10. The pain is mild. The quality of the pain is described as dull. The pain does not radiate. Associated symptoms include malaise/fatigue. Pertinent negatives include no abdominal pain, no back pain, no cough, no fever, no palpitations, no shortness of breath and no vomiting. He has tried nothing for the symptoms.  His past medical history is significant for CAD.  Pertinent negatives for past medical history include no seizures.  Procedure history is positive for cardiac catheterization.    Past Medical History:  Diagnosis Date  . Acute encephalopathy 12/12/2015  . Acute respiratory failure with hypoxemia ([place]) 12/12/2015  . Acute respiratory failure with hypoxia ([place]) 06/17/2011  . Acute respiratory failure with hypoxia and hypercapnia ([place]) 12/12/2015  . Allergic rhinitis 02/13/2010   Qualifier: Diagnosis  of  By: Wynona Luna   . Angina at rest [facility]) 10/22/2015  . Ataxia 08/10/2013  . Atrial fibrillation with RVR ([place]) 06/17/2011  . B12 deficiency 08/11/2013  . BACK PAIN, LUMBAR 01/29/2009   Qualifier: Diagnosis of  By: Wynona Luna   . BENIGN POSITIONAL VERTIGO 11/23/2009   Qualifier: Diagnosis of  By: Wynona Luna   . CAD (coronary artery disease)    s/p CABG, s/p DES to LCX  January 2011  . CAD S/P CFX DES 2011 04/25/2015  . CALLUS, LEFT FOOT 03/21/2008   Qualifier: Diagnosis of  By: Wynona Luna   . Chest pain 04/24/2015  . Chronic diastolic heart failure ([place]) 04/24/2015  . Chronic pain    left sided-[place]  . Constipation 04/24/2015  . Coronary 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 ([place]) 11/04/2015  . CVA (cerebral infarction)    left hemiparesis  . Dark urine 04/10/2011  . Diastolic CHF ([place])   . Dizziness and giddiness 08/16/2014  . Dysrhythmia    hx of atrial fibrilation with cardioversion  . Ear pain 11/26/2010  . Essential hypertension 12/12/2006   Qualifier: Diagnosis of  By: [name] RMA, Lucy    . Fatigue 11/03/2011  . GERD 12/12/2006   Qualifier: Diagnosis of  By: [name] RMA, Lucy    . GOUT 12/12/2006   Qualifier: Diagnosis of  By: Reatha Armour, Lucy    . Hematoma 09/02/2012  . Hematoma of arm 01/26/2013  . History  of stroke with residual deficit 12/12/2006   Qualifier: Diagnosis of  By: [name] RMA, Lucy    . Hx of CABG '04 05/04/2009   Qualifier: Diagnosis of  By: Mare Ferrari, [place], Sherri    . Hyperlipidemia LDL goal <70 12/12/2006   Qualifier: Diagnosis of  By: [name] RMA, Lucy     . Hypertension   . Hypogonadism male 04/10/2011  . Hypothyroidism, acquired 02/15/2013  . Insomnia 05/21/2013  . Long term current use of anticoagulant therapy 06/24/2011  . Obstructive sleep apnea-Failed CPAP 06/17/2007   Failed CPAP Using O 2 for sleep   . Other malaise and fatigue 11/17/2012  . Paroxysmal atrial fibrillation ([place]) 12/06/2016  . Persistent atrial fibrillation  04/25/2015  . Pure hypercholesterolemia 05/01/2017  . RHINOSINUSITIS, ACUTE 12/01/2008   Qualifier: Diagnosis of  By: Wynona Luna   . RUQ abdominal pain 01/13/2017  . Sensory disturbance 08/11/2013  . Sinusitis 08/26/2013  . Sleep apnea   . SOB (shortness of breath) 10/21/2016  . Spastic hemiplegia affecting dominant side ([place]) 07/21/2011  . Stroke (cerebrum) ([place]) 01/13/2017  . Stroke ([place]) 1996  . Thoracic aorta atherosclerosis ([place]) 04/27/2017  . Tubular adenoma of colon 05/2010  . Type 2 diabetes mellitus without complication, without long-term current use of insulin ([place]) 10/05/2007   Qualifier: Diagnosis of  By: Wynona Luna   . URI (upper respiratory infection) 03/05/2012  . Vasovagal syncope   . Viral URI with cough 03/05/2014  . Weakness 11/17/2012    Patient Active Problem List   Diagnosis Date Noted  . Pure hypercholesterolemia 05/01/2017  . Thoracic aorta atherosclerosis ([place]) 04/27/2017  . Stroke (cerebrum) ([place]) 01/13/2017  . RUQ abdominal pain 01/13/2017  . Paroxysmal atrial fibrillation ([place]) 12/06/2016  . SOB (shortness of breath) 10/21/2016  . Acute respiratory failure with hypoxia and hypercapnia ([place]) 12/12/2015  . Coronary 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 ([place]) 11/04/2015  . CAD S/P CFX DES 2011 04/25/2015  . Persistent atrial fibrillation 04/25/2015  . Vasovagal syncope   . Constipation 04/24/2015  . Chronic diastolic heart failure ([place]) 04/24/2015  . B12 deficiency 08/11/2013  . Sensory disturbance 08/11/2013  . Ataxia 08/10/2013  . Laceration of ear, external, right 07/05/2013  . Insomnia 05/21/2013  . Hypothyroidism, acquired 02/15/2013  . Weakness 11/17/2012  . Spastic hemiplegia affecting dominant side ([place]) 07/21/2011  . Long term current use of anticoagulant therapy 06/24/2011  . Hypogonadism male 04/10/2011  . Fatigue 03/31/2011  . Allergic rhinitis 02/13/2010  . BENIGN POSITIONAL VERTIGO 11/23/2009  . Hx of CABG  '04 05/04/2009  . Hypothyroidism 04/02/2009  . Back pain 01/29/2009  . Type 2 diabetes mellitus without complication, without long-term current use of insulin ([place]) 10/05/2007  . Obstructive sleep apnea-Failed CPAP 06/17/2007  . Hyperlipidemia LDL goal <70 12/12/2006  . GOUT 12/12/2006  . Essential hypertension 12/12/2006  . History of stroke with residual deficit 12/12/2006  . GERD 12/12/2006    Past Surgical History:  Procedure Laterality Date  . CARDIAC CATHETERIZATION N/A 10/25/2015   Procedure: Left Heart Cath and Cors/Grafts Angiography;  Surgeon: Troy Sine, MD;  Location: [facility] CV LAB;  Service: Cardiovascular;  Laterality: N/A;  . CARDIOVERSION  06/20/2011   Procedure: CARDIOVERSION;  Surgeon: Larey Dresser, MD;  Location: [facility] ENDOSCOPY;  Service: Cardiovascular;  Laterality: N/A;  . CARDIOVERSION N/A 04/26/2015   Procedure: CARDIOVERSION;  Surgeon: Dorothy Spark, MD;  Location: [facility];  Service: Cardiovascular;  Laterality: N/A;  .  CARDIOVERSION N/A 05/10/2015   Procedure: CARDIOVERSION;  Surgeon: Larey Dresser, MD;  Location: [facility];  Service: Cardiovascular;  Laterality: N/A;  . CATARACT EXTRACTION  05/2010   left eye  . CATARACT EXTRACTION  04/2010   right eye  . CORONARY ARTERY BYPASS GRAFT     stent  . ESOPHAGOGASTRODUODENOSCOPY  03-25-2005  . NASAL SEPTUM SURGERY    . PERCUTANEOUS PLACEMENT INTRAVASCULAR STENT CERVICAL CAROTID ARTERY     03-2009; using a drug-eluting platform of the circumflex cornoray artery with a 3.0 x 18 [employer] Promus drug-eluting platform post dilated to 3.75 with a noncompliant balloon.  . TEE WITHOUT CARDIOVERSION  06/20/2011   Procedure: TRANSESOPHAGEAL ECHOCARDIOGRAM (TEE);  Surgeon: Larey Dresser, MD;  Location: [facility];  Service: Cardiovascular;  Laterality: N/A;        Home Medications    Prior to Admission medications   Medication Sig Start Date End Date Taking? Authorizing Provider  allopurinol  (ZYLOPRIM) 100 MG tablet TAKE 2 TABLETS EVERY DAY 01/12/18   Debbrah Alar, NP  amoxicillin-clavulanate (AUGMENTIN) 875-125 MG tablet Take 1 tablet by mouth 2 (two) times daily. 01/27/18   Debbrah Alar, NP  atorvastatin (LIPITOR) 40 MG tablet Take 1 tablet (40 mg total) by mouth daily. 06/08/17   End, Harrell Gave, MD  benazepril (LOTENSIN) 10 MG tablet Take 1 tablet (10 mg total) by mouth daily. 07/01/17   End, Harrell Gave, MD  benazepril (LOTENSIN) 5 MG tablet TAKE 1 TABLET BY MOUTH DAILY WITH 10MG  OF BENAZEPRIL TO EQUAL 15MG  DAILY 09/08/17   End, Harrell Gave, MD  fluticasone (FLONASE) 50 MCG/ACT nasal spray Place 2 sprays into both nostrils daily. 06/02/14   Brunetta Jeans, PA-C  furosemide (LASIX) 40 MG tablet TAKE 1 TABLET BY MOUTH EVERY OTHER DAY 11/30/17   Debbrah Alar, NP  isosorbide mononitrate (IMDUR) 30 MG 24 hr tablet Take 0.5 tablets (15 mg total) by mouth daily. 05/27/17   End, Harrell Gave, MD  lactulose (CHRONULAC) 10 GM/15ML solution Take 15 mLs (10 g total) by mouth daily as needed for mild constipation. 07/16/16   Debbrah Alar, NP  levothyroxine (SYNTHROID, LEVOTHROID) 112 MCG tablet TAKE 1 TABLET (112 MCG TOTAL) BY MOUTH DAILY BEFORE BREAKFAST. 09/02/17   Debbrah Alar, NP  metFORMIN (GLUCOPHAGE) 500 MG tablet TAKE 1 TABLET (500 MG TOTAL) DAILY WITH BREAKFAST BY MOUTH. 07/23/17   Debbrah Alar, NP  Multiple Vitamins-Minerals (MULTIVITAMIN GUMMIES MENS PO) Take 1 tablet by mouth daily with breakfast.     [provider]  nitroGLYCERIN (NITROSTAT) 0.4 MG SL tablet Place 1 tablet (0.4 mg total) under the tongue every 5 (five) minutes as needed for chest pain. Up to 3 doses 03/05/15   Debbrah Alar, NP  OXYGEN Inhale 2 L into the lungs at bedtime.    [provider]  pantoprazole (PROTONIX) 40 MG tablet TAKE 1 TABLET (40 MG TOTAL) EVERY MORNING BY MOUTH. 11/11/17   Debbrah Alar, NP  traMADol (ULTRAM) 50 MG tablet Take 1 tablet  (50 mg total) by mouth 2 (two) times daily. 11/03/17   Kirsteins, Luanna Salk, MD  traMADol (ULTRAM-ER) 200 MG 24 hr tablet TAKE 1 TABLET EVERY DAY 11/26/17   Kirsteins, Luanna Salk, MD  vitamin B-12 1000 MCG tablet Take 1 tablet (1,000 mcg total) by mouth daily. 08/11/13   Orson Eva, MD  warfarin (COUMADIN) 2.5 MG tablet Take as directed by coumadin clinic 05/22/17   End, Harrell Gave, MD    Family History Family History  Problem Relation Age of  Onset  . Lung cancer Father        deceased  . Stroke Mother        deceased-MINISTROKES    Social History Social History   Tobacco Use  . Smoking status: Never Smoker  . Smokeless tobacco: Never Used  Substance Use Topics  . Alcohol use: No    Alcohol/week: 0.0 standard drinks    Comment: 1 beer a month  . Drug use: No     Allergies   Patient has no known allergies.   Review of Systems Review of Systems  Constitutional: Positive for fatigue and malaise/fatigue. Negative for chills and fever.  HENT: Negative for ear pain and sore throat.   Eyes: Negative for pain and visual disturbance.  Respiratory: Negative for cough and shortness of breath.   Cardiovascular: Positive for chest pain. Negative for palpitations.  Gastrointestinal: Negative for abdominal pain and vomiting.  Genitourinary: Negative for dysuria and hematuria.  Musculoskeletal: Negative for arthralgias and back pain.  Skin: Negative for color change and rash.  Neurological: Negative for seizures and syncope.  All other systems reviewed and are negative.    Physical Exam Updated Vital Signs  ED Triage Vitals  Enc Vitals Group     BP 02/17/18 0952 (!) 152/134     Pulse Rate 02/17/18 0952 80     Resp 02/17/18 0952 20     Temp 02/17/18 0952 98.3 F (36.8 C)     Temp Source 02/17/18 0952 Oral     SpO2 02/17/18 0952 96 %     Weight 02/17/18 0954 250 lb (113.4 kg)     Height 02/17/18 0954 6\' 3"  (1.905 m)     Head Circumference --      Peak Flow --      Pain Score  02/17/18 0953 5     Pain Loc --      Pain Edu? --      Excl. in [place]? --     Physical Exam  Constitutional: He appears well-developed and well-nourished.  HENT:  Head: Normocephalic and atraumatic.  Eyes: Conjunctivae and EOM are normal.  Neck: Normal range of motion. Neck supple.  Cardiovascular: Normal rate, regular rhythm, normal heart sounds and intact distal pulses.  No murmur heard. Pulmonary/Chest: Effort normal and breath sounds normal. No respiratory distress. He has no decreased breath sounds. He has no wheezes. He has no rhonchi. He has no rales.  Abdominal: Soft. There is no tenderness.  Musculoskeletal: He exhibits no edema.       Right lower leg: He exhibits no edema.       Left lower leg: He exhibits no edema.  Neurological: He is alert.  Skin: Skin is warm and dry. Capillary refill takes less than 2 seconds.  Psychiatric: He has a normal mood and affect.  Nursing note and vitals reviewed.    ED Treatments / Results  Labs (all labs ordered are listed, but only abnormal results are displayed) Labs Reviewed  BASIC METABOLIC PANEL - Abnormal; Notable for the following components:      Result Value   Glucose, Bld 138 (*)    GFR calc non Af Amer 58 (*)    All other components within normal limits  PROTIME-INR - Abnormal; Notable for the following components:   Prothrombin Time 22.5 (*)    All other components within normal limits  CBC  I-STAT TROPONIN, ED  I-STAT TROPONIN, ED    EKG EKG Interpretation  Date/Time:  Wednesday February 17 2018 09:49:37 EST Ventricular Rate:  82 PR Interval:  144 QRS Duration: 92 QT Interval:  368 QTC Calculation: 429 R Axis:   -43 Text Interpretation:  Normal sinus rhythm Left axis deviation Left ventricular hypertrophy Nonspecific T wave abnormality Abnormal ECG Confirmed by Lennice Sites [phone]) on 02/17/2018 10:14:11 AM   Radiology Dg Chest 2 View  Result Date: 02/17/2018 CLINICAL DATA:  Seven day history of  arrhythmia in this patient with prior cardioversion for atrial fibrillation, associated with shortness of breath upon exertion. Prior CABG. Personal history of stroke with LEFT-sided deficits. EXAM: CHEST - 2 VIEW COMPARISON:  04/27/2017, 01/13/2017 and earlier, including high-resolution CT chest 11/04/2016. FINDINGS: Prior sternotomy for CABG. Cardiac silhouette upper normal in size, unchanged. Thoracic aorta mildly tortuous and atherosclerotic, unchanged. Hilar and mediastinal contours otherwise unremarkable. Chronic elevation of the RIGHT hemidiaphragm with chronic scar/atelectasis in the RIGHT MIDDLE LOBE and RIGHT LOWER LOBE, unchanged. Lungs otherwise clear. Pulmonary vascularity normal. No pleural effusions. Degenerative changes and DISH involving the thoracic spine. IMPRESSION: 1.  No acute cardiopulmonary disease. 2. Stable chronic elevation of the RIGHT hemidiaphragm and chronic scar/atelectasis in the RIGHT MIDDLE LOBE and RIGHT LOWER LOBE. Electronically Signed   By: Evangeline Dakin M.D.   On: 02/17/2018 10:41    Procedures .Critical Care Performed by: Lennice Sites, DO Authorized by: Lennice Sites, DO   Critical care provider statement:    Critical care time (minutes):  35   Critical care was necessary to treat or prevent imminent or life-threatening deterioration of the following conditions:  Cardiac failure   Critical care was time spent personally by me on the following activities:  Development of treatment plan with patient or surrogate, discussions with primary provider, discussions with consultants, examination of patient, ordering and performing treatments and interventions, ordering and review of laboratory studies, ordering and review of radiographic studies, pulse oximetry, re-evaluation of patient's condition and review of old charts   I assumed direction of critical care for this patient from another provider in my specialty: no     (including critical care  time)  Medications Ordered in ED Medications - No data to display   Initial Impression / Assessment and Plan / ED Course  I have reviewed the triage vital signs and the nursing notes.  Pertinent labs & imaging results that were available during my care of the patient were reviewed by me and considered in my medical decision making (see chart for details).     [employer] is a 72 year old male with history of coronary artery disease status post CABG, atrial fibrillation on Coumadin who presents to the ED with fatigue, chest pain.  Patient with normal vitals.  No fever.  Patient with generalized fatigue over the last several days.  Has had some left-sided chest pain.  Feels similar to how he felt when he has had heart issues in the past.  Patient had recent cardiac stress test in February that showed low risk disease.  Was told no need for catheterization at that time.  Has not had a heart cath in several years.  Patient overall is well-appearing.  Clear breath sounds on exam.  EKG showed T wave inversions in the lateral leads that are slightly new from prior.  Troponin within normal limits.  Chest x-ray showed no signs of pneumonia, pneumothorax, pleural effusion.  Otherwise no significant electrolyte abnormality, kidney injury, leukocytosis.  Cardiology was consulted given EKG changes and history and they came down to the ED to evaluated the  patient and recommend starting IV heparin and they will catheterize the patient tomorrow morning.  They suggest holding Coumadin at this time.  Patient admitted to medicine service for further care.  Hemodynamically stable throughout my care.  This chart was dictated using voice recognition software.  Despite best efforts to proofread,  errors can occur which can change the documentation meaning.   Final Clinical Impressions(s) / ED Diagnoses   Final diagnoses:  Chest pain, unspecified type    ED Discharge Orders    None       Lennice Sites,  DO 02/17/18 [place], [place], DO 02/17/18 1328

## 2018-02-17 NOTE — H&P (Signed)
History and Physical    [name] DDU:[id number] DOB: Jul 24, 1945 DOA: 02/17/2018  PCP: Debbrah Alar, NP Consultants:  Letta Pate - PM&R; End/Berry - cardiology Patient coming from:  Home - lives with wife and daughter and son-in-law and 2 grandchildren; NOK: Wife, [phone]  Chief Complaint:  CP/SOB/fatigue  HPI: [name] is a 72 y.o. male with medical history significant of DM; CVA with resultant hemiplegia; HLD; afib on Coumadin; OSA not on CPAP; HTN; hypothyroidism; CAD s/p CABG; diastolic CHF; and chronic pain presenting with CP/SOB/fatigue.  Patient thought that his heart was out of rhythm - he couldn't walk 15 steps without getting lightheaded and this is how it presented in the past.  He thinks there is a blockage somewhere.  "The heart's operating 100%; the problem is the stents have got to be checked."  Her does not ever have chest pain.  He had a stroke in 1996 and has left hemiplegia; he thinks that is why he doesn't have chest pain.  +SOB with exertion, only recently.  ED Course:  Cardiology saw - recommend Heparin (hold Coumadin) and cath tomorrow. EKG with lateral TWI, ?new.  First troponin 0.06, borderline high.  H/o low-risk stress test February.  Review of Systems: As per HPI; otherwise review of systems reviewed and negative.   Ambulatory Status:  Ambulates with a walker  Past Medical History:  Diagnosis Date  . Acute encephalopathy 12/12/2015  . Acute respiratory failure with hypoxia and hypercapnia ([place]) 12/12/2015  . Allergic rhinitis 02/13/2010   Qualifier: Diagnosis of  By: Wynona Luna   . Angina at rest [facility]) 10/22/2015  . Ataxia 08/10/2013  . B12 deficiency 08/11/2013  . BACK PAIN, LUMBAR 01/29/2009   Qualifier: Diagnosis of  By: Wynona Luna   . BENIGN POSITIONAL VERTIGO 11/23/2009   Qualifier: Diagnosis of  By: Wynona Luna   . Chronic diastolic heart failure ([place]) 04/24/2015  . Chronic pain    left sided-Kristeins  . Coronary  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 ([place]) 11/04/2015   s/p CABG 2004; CFX DES 2011  . Essential hypertension 12/12/2006   Qualifier: Diagnosis of  By: [name] RMA, Lucy    . GERD 12/12/2006   Qualifier: Diagnosis of  By: [name] RMA, Lucy    . GOUT 12/12/2006   Qualifier: Diagnosis of  By: Reatha Armour, Lucy    . Hyperlipidemia LDL goal <70 12/12/2006   Qualifier: Diagnosis of  By: [name] RMA, Lucy     . Hypogonadism male 04/10/2011  . Hypothyroidism, acquired 02/15/2013  . Insomnia 05/21/2013  . Long term current use of anticoagulant therapy 06/24/2011  . Obstructive sleep apnea-Failed CPAP 06/17/2007   Failed CPAP Using O 2 for sleep   . Paroxysmal atrial fibrillation ([place]) 12/06/2016  . Stroke (cerebrum) ([place]) 01/13/2017   also 1996; resultant hemiplegia of dominant side  . Thoracic aorta atherosclerosis ([place]) 04/27/2017  . Tubular adenoma of colon 05/2010  . Type 2 diabetes mellitus without complication, without long-term current use of insulin ([place]) 10/05/2007   Qualifier: Diagnosis of  By: Wynona Luna   . Vasovagal syncope     Past Surgical History:  Procedure Laterality Date  . CARDIAC CATHETERIZATION N/A 10/25/2015   Procedure: Left Heart Cath and Cors/Grafts Angiography;  Surgeon: Troy Sine, MD;  Location: [place] CV LAB;  Service: Cardiovascular;  Laterality: N/A;  . CARDIOVERSION  06/20/2011   Procedure: CARDIOVERSION;  Surgeon: Larey Dresser,  MD;  Location: [place];  Service: Cardiovascular;  Laterality: N/A;  . CARDIOVERSION N/A 04/26/2015   Procedure: CARDIOVERSION;  Surgeon: Dorothy Spark, MD;  Location: [facility] ENDOSCOPY;  Service: Cardiovascular;  Laterality: N/A;  . CARDIOVERSION N/A 05/10/2015   Procedure: CARDIOVERSION;  Surgeon: Larey Dresser, MD;  Location: [place];  Service: Cardiovascular;  Laterality: N/A;  . CATARACT EXTRACTION  05/2010   left eye  . CATARACT EXTRACTION  04/2010   right eye  . CORONARY ARTERY BYPASS GRAFT      stent  . ESOPHAGOGASTRODUODENOSCOPY  03-25-2005  . NASAL SEPTUM SURGERY    . PERCUTANEOUS PLACEMENT INTRAVASCULAR STENT CERVICAL CAROTID ARTERY     03-2009; using a drug-eluting platform of the circumflex cornoray artery with a 3.0 x 18 [employer] Promus drug-eluting platform post dilated to 3.75 with a noncompliant balloon.  . TEE WITHOUT CARDIOVERSION  06/20/2011   Procedure: TRANSESOPHAGEAL ECHOCARDIOGRAM (TEE);  Surgeon: Larey Dresser, MD;  Location: [facility] ENDOSCOPY;  Service: Cardiovascular;  Laterality: N/A;    Social History   Socioeconomic History  . Marital status: Married    Spouse name: [name]  . Number of children: Not on file  . Years of education: 4  . Highest education level: Not on file  Occupational History  . 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 Financial resource strain: Not on file  . Food insecurity:    Worry: Not on file    Inability: Not on file  . Transportation needs:    Medical: Not on file    Non-medical: Not on file  Tobacco Use  . Smoking status: Never Smoker  . Smokeless tobacco: Never Used  Substance and Sexual Activity  . Alcohol use: No    Alcohol/week: 0.0 standard drinks    Comment: 1 beer a month  . Drug use: No  . Sexual activity: Never  Lifestyle  . Physical activity:    Days per week: Not on file    Minutes per session: Not on file  . Stress: Not on file  Relationships  . Social connections:    Talks on phone: Not on file    Gets together: Not on file    Attends religious service: Not on file    Active member of club or organization: Not on file    Attends meetings of clubs or organizations: Not on file    Relationship status: Not on file  . Intimate partner violence:    Fear of current or ex partner: Not on file    Emotionally abused: Not on file    Physically abused: Not on file    Forced sexual activity: Not on file  Other Topics Concern  . Not on file  Social History  Narrative   Pt lives with wife. Does have stairs, but patient doesn't use them. Pt has completed technical school    No Known Allergies  Family History  Problem Relation Age of Onset  . Lung cancer Father        deceased  . Stroke Mother        deceased-[place]    Prior to Admission medications   Medication Sig Start Date End Date Taking? Authorizing Provider  allopurinol (ZYLOPRIM) 100 MG tablet TAKE 2 TABLETS EVERY DAY 01/12/18   Debbrah Alar, NP  amoxicillin-clavulanate (AUGMENTIN) 875-125 MG tablet Take 1 tablet by mouth 2 (two) times daily. 01/27/18   Debbrah Alar, NP  atorvastatin (LIPITOR)  40 MG tablet Take 1 tablet (40 mg total) by mouth daily. 06/08/17   End, Harrell Gave, MD  benazepril (LOTENSIN) 10 MG tablet Take 1 tablet (10 mg total) by mouth daily. 07/01/17   End, Harrell Gave, MD  benazepril (LOTENSIN) 5 MG tablet TAKE 1 TABLET BY MOUTH DAILY WITH 10MG  OF BENAZEPRIL TO EQUAL 15MG  DAILY 09/08/17   End, Harrell Gave, MD  fluticasone (FLONASE) 50 MCG/ACT nasal spray Place 2 sprays into both nostrils daily. 06/02/14   Brunetta Jeans, PA-C  furosemide (LASIX) 40 MG tablet TAKE 1 TABLET BY MOUTH EVERY OTHER DAY 11/30/17   Debbrah Alar, NP  isosorbide mononitrate (IMDUR) 30 MG 24 hr tablet Take 0.5 tablets (15 mg total) by mouth daily. 05/27/17   End, Harrell Gave, MD  lactulose (CHRONULAC) 10 GM/15ML solution Take 15 mLs (10 g total) by mouth daily as needed for mild constipation. 07/16/16   Debbrah Alar, NP  levothyroxine (SYNTHROID, LEVOTHROID) 112 MCG tablet TAKE 1 TABLET (112 MCG TOTAL) BY MOUTH DAILY BEFORE BREAKFAST. 09/02/17   Debbrah Alar, NP  metFORMIN (GLUCOPHAGE) 500 MG tablet TAKE 1 TABLET (500 MG TOTAL) DAILY WITH BREAKFAST BY MOUTH. 07/23/17   Debbrah Alar, NP  Multiple Vitamins-Minerals (MULTIVITAMIN GUMMIES MENS PO) Take 1 tablet by mouth daily with breakfast.     [provider]  nitroGLYCERIN (NITROSTAT) 0.4 MG SL  tablet Place 1 tablet (0.4 mg total) under the tongue every 5 (five) minutes as needed for chest pain. Up to 3 doses 03/05/15   Debbrah Alar, NP  OXYGEN Inhale 2 L into the lungs at bedtime.    [provider]  pantoprazole (PROTONIX) 40 MG tablet TAKE 1 TABLET (40 MG TOTAL) EVERY MORNING BY MOUTH. 11/11/17   Debbrah Alar, NP  traMADol (ULTRAM) 50 MG tablet Take 1 tablet (50 mg total) by mouth 2 (two) times daily. 11/03/17   Kirsteins, Luanna Salk, MD  traMADol (ULTRAM-ER) 200 MG 24 hr tablet TAKE 1 TABLET EVERY DAY 11/26/17   Kirsteins, Luanna Salk, MD  vitamin B-12 1000 MCG tablet Take 1 tablet (1,000 mcg total) by mouth daily. 08/11/13   Orson Eva, MD  warfarin (COUMADIN) 2.5 MG tablet Take as directed by coumadin clinic 05/22/17   End, Harrell Gave, MD    Physical Exam: Vitals:   02/17/18 1130 02/17/18 1145 02/17/18 1315 02/17/18 1400  BP: 131/71 121/73 (!) 134/111 (!) 123/108  Pulse: 72 69 78 71  Resp: 12 12 (!) 21 14  Temp:      TempSrc:      SpO2: 96% 94% 99% 98%  Weight:      Height:         General:  Appears calm and comfortable and is NAD; he is very conversant Eyes:  PERRL, EOMI, normal lids, iris ENT:  grossly normal hearing, lips & tongue, mmm; poor dentition Neck:  no LAD, masses or thyromegaly; no carotid bruits Cardiovascular:  RRR, no m/r/g. No LE edema.  Respiratory:   CTA bilaterally with no wheezes/rales/rhonchi.  Normal respiratory effort. Abdomen:  soft, NT, ND, NABS Skin:  no rash or induration seen on limited exam Musculoskeletal:  grossly normal tone RUE/RLE with chronic hemiparesis on the L, good ROM, no bony abnormality Psychiatric:  grossly normal mood and affect, speech fluent and appropriate but verbose, AOx3 Neurologic:  CN 2-12 grossly intact, moves all extremities in coordinated fashion, sensation intact    Radiological Exams on Admission: Dg Chest 2 View  Result Date: 02/17/2018 CLINICAL DATA:  Seven day history of arrhythmia  in  this patient with prior cardioversion for atrial fibrillation, associated with shortness of breath upon exertion. Prior CABG. Personal history of stroke with LEFT-sided deficits. EXAM: CHEST - 2 VIEW COMPARISON:  04/27/2017, 01/13/2017 and earlier, including high-resolution CT chest 11/04/2016. FINDINGS: Prior sternotomy for CABG. Cardiac silhouette upper normal in size, unchanged. Thoracic aorta mildly tortuous and atherosclerotic, unchanged. Hilar and mediastinal contours otherwise unremarkable. Chronic elevation of the RIGHT hemidiaphragm with chronic scar/atelectasis in the RIGHT MIDDLE LOBE and RIGHT LOWER LOBE, unchanged. Lungs otherwise clear. Pulmonary vascularity normal. No pleural effusions. Degenerative changes and DISH involving the thoracic spine. IMPRESSION: 1.  No acute cardiopulmonary disease. 2. Stable chronic elevation of the RIGHT hemidiaphragm and chronic scar/atelectasis in the RIGHT MIDDLE LOBE and RIGHT LOWER LOBE. Electronically Signed   By: Evangeline Dakin M.D.   On: 02/17/2018 10:41    EKG: Independently reviewed.  NSR with rate 82; LVH; nonspecific ST changes with no evidence of acute ischemia   Labs on Admission: I have personally reviewed the available labs and imaging studies at the time of the admission.  Pertinent labs:   Glucose 138, otherwise unremarkable BMP Troponin 0.06 Normal CBC INR 2.01  Assessment/Plan Principal Problem:   Ischemic chest pain (HCC) Active Problems:   Hyperlipidemia LDL goal <70   Essential hypertension   History of stroke with residual deficit   Type 2 diabetes mellitus without complication, without long-term current use of insulin ([place])   Long term current use of anticoagulant therapy   Hypothyroidism, acquired   Chronic diastolic heart failure (HCC)   Persistent atrial fibrillation   Ischemic chest pain with h/o severe ASCVD -Unstable angina -Patient with substernal chest pain with decreased ability to do ADLs because of  exertional fatigue and SOB, concerning for anginal equivalent  -CXR unremarkable.   -Initial cardiac enzymes negative.   -EKG with no apparent STEMI. -TIMI risk score is 5; which predicts a 14 days risk of death, recurrent MI, or urgent revascularization of 26.2%.  -Will plan to admit to [facility]U at [facility] on telemetry to further evaluate for ACS by overnight observation.  -Patient discussed with cardiology, who is planning to do cath tomorrow if INR is low enough -cycle troponin q6h x 3 and repeat EKG in AM -ASA 81 mg PO daily -morphine given -Increase Lipitor to 80 mg qhs for now -Risk factor stratification with FLP; will also check TSH  -He has been started on a heparin drip empirically -Continue Imdur for now  HTN -Takes Lotensin monotherapy at home -Will add low-dose metoprolol at this time  HLD -Continue Lipitor but increase from 40 to 80 mr empirically -Lipids were checked in 4/19 (TC 131, HDL 45, LDL 66, TG 97), will repeat   DM -Recent A1c was 6.7 -Hold PO Glucophage  -Will cover with moderate-scale SSI for now  Afib, on Coumadin -Rate controlled, currently in NSR -Starting low-dose Lopressor, as above -Holding Coumadin in anticipation of cath; pharmacy to dose heparin as appropriate  Hypothyroidism -Normal TSH in 11/19 -Continue Synthroid at current dose for now  H/o CVA with L hemiplegia -Does not appear to have obvious new deficits -Does not appear to need additional evaluation at this time -If cardiac evaluation is negative and symptoms persist, consider further evalaution  Chronic diastolic CHF -Echo in 99/37 with preserved EF and grade 1 diastolic dysfunction -Appears to be compensated at this time -Cardiology has ordered a repeat Echo  DVT prophylaxis: Heparin drip Code Status:  Full - confirmed with patient Family  Communication: None present  Disposition Plan:  Home once clinically improved Consults called: Cardiology  Admission status: It is my clinical  opinion that referral for OBSERVATION is reasonable and necessary in this patient based on the above information provided. The aforementioned taken together are felt to place the patient at high risk for further clinical deterioration. However it is anticipated that the patient may be medically stable for discharge from the hospital within 24 to 48 hours.      Karmen Bongo MD [facility]  If note is complete, please contact covering daytime or nighttime physician. [email or web address] Password [email or web address]  02/17/2018, 2:24 PM

## 2018-02-17 NOTE — ED Triage Notes (Signed)
Pt. Has a-fib and has felt it since Thanksgiving.  He stated, "I need cardioversion."  Pt. Also is having sob, chest pain.  Pt., having occasional cough.  Denies any chills or fever.  Skin is warm, pink and dry.   Pt. Is alert and oriented X4

## 2018-02-17 NOTE — Progress Notes (Signed)
CRITICAL VALUE ALERT  Critical Value:  Troponin 0.04  Date & Time Notied:  02/17/18  1642  Provider Notified: Lorin Mercy via text page.

## 2018-02-17 NOTE — Progress Notes (Signed)
ANTICOAGULATION CONSULT NOTE - Initial Consult  Pharmacy Consult for heparin  Indication: chest pain/ACS  No Known Allergies  Patient Measurements: Height: 6\' 3"  (190.5 cm) Weight: 250 lb (113.4 kg) IBW/kg (Calculated) : 84.5 Heparin Dosing Weight: 108kg  Vital Signs: Temp: 98.3 F (36.8 C) (12/04 0952) Temp Source: Oral (12/04 0952) BP: 121/73 (12/04 1145) Pulse Rate: 69 (12/04 1145)  Labs: Recent Labs    02/17/18 0956  HGB 13.5  HCT 43.2  PLT 172  LABPROT 22.5*  INR 2.01  CREATININE 1.22    Estimated Creatinine Clearance: 72.2 mL/min (by C-G formula based on SCr of 1.22 mg/dL).   Medical History: Past Medical History:  Diagnosis Date  . Acute encephalopathy 12/12/2015  . Acute respiratory failure with hypoxemia ([place]) 12/12/2015  . Acute respiratory failure with hypoxia ([place]) 06/17/2011  . Acute respiratory failure with hypoxia and hypercapnia ([place]) 12/12/2015  . Allergic rhinitis 02/13/2010   Qualifier: Diagnosis of  By: Wynona Luna   . Angina at rest [facility]) 10/22/2015  . Ataxia 08/10/2013  . Atrial fibrillation with RVR ([place]) 06/17/2011  . B12 deficiency 08/11/2013  . BACK PAIN, LUMBAR 01/29/2009   Qualifier: Diagnosis of  By: Wynona Luna   . BENIGN POSITIONAL VERTIGO 11/23/2009   Qualifier: Diagnosis of  By: Wynona Luna   . CAD (coronary artery disease)    s/p CABG, s/p DES to LCX  January 2011  . CAD S/P CFX DES 2011 04/25/2015  . CALLUS, LEFT FOOT 03/21/2008   Qualifier: Diagnosis of  By: Wynona Luna   . Chest pain 04/24/2015  . Chronic diastolic heart failure ([place]) 04/24/2015  . Chronic pain    left sided-Kristeins  . Constipation 04/24/2015  . Coronary 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 ([place]) 11/04/2015  . CVA (cerebral infarction)    left hemiparesis  . Dark urine 04/10/2011  . Diastolic CHF ([place])   . Dizziness and giddiness 08/16/2014  . Dysrhythmia    hx of atrial fibrilation with cardioversion  . Ear pain 11/26/2010   . Essential hypertension 12/12/2006   Qualifier: Diagnosis of  By: [name] RMA, Lucy    . Fatigue 11/03/2011  . GERD 12/12/2006   Qualifier: Diagnosis of  By: [name] RMA, Lucy    . GOUT 12/12/2006   Qualifier: Diagnosis of  By: Reatha Armour, Lucy    . Hematoma 09/02/2012  . Hematoma of arm 01/26/2013  . History of stroke with residual deficit 12/12/2006   Qualifier: Diagnosis of  By: [name] RMA, Lucy    . Hx of CABG '04 05/04/2009   Qualifier: Diagnosis of  By: Mare Ferrari, [place], Sherri    . Hyperlipidemia LDL goal <70 12/12/2006   Qualifier: Diagnosis of  By: [name] RMA, Lucy     . Hypertension   . Hypogonadism male 04/10/2011  . Hypothyroidism, acquired 02/15/2013  . Insomnia 05/21/2013  . Long term current use of anticoagulant therapy 06/24/2011  . Obstructive sleep apnea-Failed CPAP 06/17/2007   Failed CPAP Using O 2 for sleep   . Other malaise and fatigue 11/17/2012  . Paroxysmal atrial fibrillation ([place]) 12/06/2016  . Persistent atrial fibrillation 04/25/2015  . Pure hypercholesterolemia 05/01/2017  . RHINOSINUSITIS, ACUTE 12/01/2008   Qualifier: Diagnosis of  By: Wynona Luna   . RUQ abdominal pain 01/13/2017  . Sensory disturbance 08/11/2013  . Sinusitis 08/26/2013  . Sleep apnea   . SOB (shortness of breath) 10/21/2016  . Spastic hemiplegia affecting  dominant side ([place]) 07/21/2011  . Stroke (cerebrum) ([place]) 01/13/2017  . Stroke ([place]) 1996  . Thoracic aorta atherosclerosis ([place]) 04/27/2017  . Tubular adenoma of colon 05/2010  . Type 2 diabetes mellitus without complication, without long-term current use of insulin ([place]) 10/05/2007   Qualifier: Diagnosis of  By: Wynona Luna   . URI (upper respiratory infection) 03/05/2012  . Vasovagal syncope   . Viral URI with cough 03/05/2014  . Weakness 11/17/2012    Assessment: 72 yo male with CP to start heparin when INR < 2.0. He is on coumadin PTA for afib/CVA. Plans for cath 12/5 -INR= 2.01   Goal of Therapy:  Heparin level 0.3-0.7 units/ml Monitor  platelets by anticoagulation protocol: Yes   Plan:  -Check INR in am and begin heparin when INR < 2.0  Hildred Laser, PharmD Clinical Pharmacist **Pharmacist phone directory can now be found on [email or web address] (PW [email or web address]).  Listed under [place]certain.

## 2018-02-17 NOTE — Consult Note (Addendum)
Cardiology Consultation:   Patient ID: Jomo Forand MRN: [id number]; DOB: 1945/10/23  Admit date: 02/17/2018 Date of Consult: 02/17/2018  Primary Care Provider: Debbrah Alar, NP Primary Cardiologist: Nelva Bush, MD  Patient Profile:   Nora Rooke is a 72 y.o. male with a hx of coronary artery disease status post CABG in 5170, chronic diastolic heart failure, paroxysmal atrial fibrillation, stroke with residual L sided weakness, hypertension, hyperlipidemia, and obstructive sleep apnea who is being seen today for the evaluation of possible afib  at the request of Dr. Ronnald Nian.   Last cath 10/2015 showed 90% ostial LAD, patent LIMA-LAD, nondominant RCA, patent SVG-D1 and SVG-OM2, total occlusion SVG-AM.  Stable anatomy. Recommended medical therapy.  Last echo 12/2016 showed LVEF of 55-60% Last stress test 04/2017 was low risk.   Last seen by Dr. Saunders Revel 08/2017. Complained of arm paresthesias. Arterial dopplers were done>> Mild stenosis in bilateral ICAs. No significant subclavian or bilateral upper extremity stenosis  He continues to have neurological symptoms when last seen by Cecilie Kicks 11/05/17.  Tries to follow-up with neurology however he never did.  History of Present Illness:   Mr. Ashmead drove himself to ER this morning with multiple complaints.  He had multiple episode of vomiting and diarrhea at the prior to Thanksgiving after eating home cooked chicken.  His daughter and wife had similar symptoms and later find out contaminated coffee machine.  His symptoms resolved on Friday.  However, for past 2 days (Monday and Tuesday) patient has noted exertional shortness of breath without chest pain or associated palpitation.  He thought this might be due to "circulatory issue" and may be in atrial fibrillation.  He has intermittent dizziness and complains of worsening left-sided weakness and slurred speech.  Denies syncope, orthopnea, PND, lower extremity edema or melena.   Compliant with medication.  Due to ongoing exertional shortness of breath he came to ER for further evaluation.  He used to do yard work and household project without any difficulty few weeks ago.  He uses cane for gait instability lately.  Point of care troponin 0.6.  Checks x-ray without acute cardiopulmonary disease.  Electrolyte and serum creatinine normal.  EKG shows sinus rhythm with nonspecific T wave inversion in the anterior lateral leads which somewhat appears new.  Past Medical History:  Diagnosis Date  . Acute encephalopathy 12/12/2015  . Acute respiratory failure with hypoxemia ([place]) 12/12/2015  . Acute respiratory failure with hypoxia ([place]) 06/17/2011  . Acute respiratory failure with hypoxia and hypercapnia ([place]) 12/12/2015  . Allergic rhinitis 02/13/2010   Qualifier: Diagnosis of  By: Wynona Luna   . Angina at rest [facility]) 10/22/2015  . Ataxia 08/10/2013  . Atrial fibrillation with RVR ([place]) 06/17/2011  . B12 deficiency 08/11/2013  . BACK PAIN, LUMBAR 01/29/2009   Qualifier: Diagnosis of  By: Wynona Luna   . BENIGN POSITIONAL VERTIGO 11/23/2009   Qualifier: Diagnosis of  By: Wynona Luna   . CAD (coronary artery disease)    s/p CABG, s/p DES to LCX  January 2011  . CAD S/P CFX DES 2011 04/25/2015  . CALLUS, LEFT FOOT 03/21/2008   Qualifier: Diagnosis of  By: Wynona Luna   . Chest pain 04/24/2015  . Chronic diastolic heart failure ([place]) 04/24/2015  . Chronic pain    left sided-[place]  . Constipation 04/24/2015  . Coronary 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 ([place]) 11/04/2015  . CVA (cerebral infarction)  left hemiparesis  . Dark urine 04/10/2011  . Diastolic CHF ([place])   . Dizziness and giddiness 08/16/2014  . Dysrhythmia    hx of atrial fibrilation with cardioversion  . Ear pain 11/26/2010  . Essential hypertension 12/12/2006   Qualifier: Diagnosis of  By: [name] RMA, Lucy    . Fatigue 11/03/2011  . GERD 12/12/2006   Qualifier: Diagnosis of   By: [name] RMA, Lucy    . GOUT 12/12/2006   Qualifier: Diagnosis of  By: Reatha Armour, Lucy    . Hematoma 09/02/2012  . Hematoma of arm 01/26/2013  . History of stroke with residual deficit 12/12/2006   Qualifier: Diagnosis of  By: [name] RMA, Lucy    . Hx of CABG '04 05/04/2009   Qualifier: Diagnosis of  By: Mare Ferrari, [place], Sherri    . Hyperlipidemia LDL goal <70 12/12/2006   Qualifier: Diagnosis of  By: [name] RMA, Lucy     . Hypertension   . Hypogonadism male 04/10/2011  . Hypothyroidism, acquired 02/15/2013  . Insomnia 05/21/2013  . Long term current use of anticoagulant therapy 06/24/2011  . Obstructive sleep apnea-Failed CPAP 06/17/2007   Failed CPAP Using O 2 for sleep   . Other malaise and fatigue 11/17/2012  . Paroxysmal atrial fibrillation ([place]) 12/06/2016  . Persistent atrial fibrillation 04/25/2015  . Pure hypercholesterolemia 05/01/2017  . RHINOSINUSITIS, ACUTE 12/01/2008   Qualifier: Diagnosis of  By: Wynona Luna   . RUQ abdominal pain 01/13/2017  . Sensory disturbance 08/11/2013  . Sinusitis 08/26/2013  . Sleep apnea   . SOB (shortness of breath) 10/21/2016  . Spastic hemiplegia affecting dominant side ([place]) 07/21/2011  . Stroke (cerebrum) ([place]) 01/13/2017  . Stroke ([place]) 1996  . Thoracic aorta atherosclerosis ([place]) 04/27/2017  . Tubular adenoma of colon 05/2010  . Type 2 diabetes mellitus without complication, without long-term current use of insulin ([place]) 10/05/2007   Qualifier: Diagnosis of  By: Wynona Luna   . URI (upper respiratory infection) 03/05/2012  . Vasovagal syncope   . Viral URI with cough 03/05/2014  . Weakness 11/17/2012    Past Surgical History:  Procedure Laterality Date  . CARDIAC CATHETERIZATION N/A 10/25/2015   Procedure: Left Heart Cath and Cors/Grafts Angiography;  Surgeon: Troy Sine, MD;  Location: [facility];  Service: Cardiovascular;  Laterality: N/A;  . CARDIOVERSION  06/20/2011   Procedure: CARDIOVERSION;  Surgeon: Larey Dresser, MD;   Location: [facility] ENDOSCOPY;  Service: Cardiovascular;  Laterality: N/A;  . CARDIOVERSION N/A 04/26/2015   Procedure: CARDIOVERSION;  Surgeon: Dorothy Spark, MD;  Location: [facility] ENDOSCOPY;  Service: Cardiovascular;  Laterality: N/A;  . CARDIOVERSION N/A 05/10/2015   Procedure: CARDIOVERSION;  Surgeon: Larey Dresser, MD;  Location: [facility];  Service: Cardiovascular;  Laterality: N/A;  . CATARACT EXTRACTION  05/2010   left eye  . CATARACT EXTRACTION  04/2010   right eye  . CORONARY ARTERY BYPASS GRAFT     stent  . ESOPHAGOGASTRODUODENOSCOPY  03-25-2005  . NASAL SEPTUM SURGERY    . PERCUTANEOUS PLACEMENT INTRAVASCULAR STENT CERVICAL CAROTID ARTERY     03-2009; using a drug-eluting platform of the circumflex cornoray artery with a 3.0 x 18 [employer] Promus drug-eluting platform post dilated to 3.75 with a noncompliant balloon.  . TEE WITHOUT CARDIOVERSION  06/20/2011   Procedure: TRANSESOPHAGEAL ECHOCARDIOGRAM (TEE);  Surgeon: Larey Dresser, MD;  Location: [facility] ENDOSCOPY;  Service: Cardiovascular;  Laterality: N/A;     Inpatient Medications: Scheduled Meds:  Continuous Infusions:  PRN Meds:   Allergies:   No Known Allergies  Social History:   Social History   Socioeconomic History  . Marital status: Married    Spouse name: [name]  . Number of children: Not on file  . Years of education: 58  . Highest education level: Not on file  Occupational History  . 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 Financial resource strain: Not on file  . Food insecurity:    Worry: Not on file    Inability: Not on file  . Transportation needs:    Medical: Not on file    Non-medical: Not on file  Tobacco Use  . Smoking status: Never Smoker  . Smokeless tobacco: Never Used  Substance and Sexual Activity  . Alcohol use: No    Alcohol/week: 0.0 standard drinks    Comment: 1 beer a month  . Drug use: No  . Sexual activity: Never  Lifestyle    . Physical activity:    Days per week: Not on file    Minutes per session: Not on file  . Stress: Not on file  Relationships  . Social connections:    Talks on phone: Not on file    Gets together: Not on file    Attends religious service: Not on file    Active member of club or organization: Not on file    Attends meetings of clubs or organizations: Not on file    Relationship status: Not on file  . Intimate partner violence:    Fear of current or ex partner: Not on file    Emotionally abused: Not on file    Physically abused: Not on file    Forced sexual activity: Not on file  Other Topics Concern  . Not on file  Social History Narrative   Pt lives with wife. Does have stairs, but patient doesn't use them. Pt has completed technical school    Family History:    Family History  Problem Relation Age of Onset  . Lung cancer Father        deceased  . Stroke Mother        deceased-MINISTROKES     ROS:  Please see the history of present illness.  All other ROS reviewed and negative.     Physical Exam/Data:   Vitals:   02/17/18 1100 02/17/18 1115 02/17/18 1130 02/17/18 1145  BP: 120/69 112/66 131/71 121/73  Pulse: 72 69 72 69  Resp: 12 13 12 12   Temp:      TempSrc:      SpO2: 96% 94% 96% 94%  Weight:      Height:       No intake or output data in the 24 hours ending 02/17/18 1235 Filed Weights   02/17/18 0954  Weight: 113.4 kg   Body mass index is 31.25 kg/m.  General:  Well nourished, well developed, in no acute distress HEENT: normal Lymph: no adenopathy Neck: no JVD Endocrine:  No thryomegaly Vascular: No carotid bruits; FA pulses 2+ bilaterally without bruits  Cardiac:  normal S1, S2; RRR; no murmur  Lungs:  clear to auscultation bilaterally, no wheezing, rhonchi or rales  Abd: soft, nontender, no hepatomegaly  Ext: no edema Musculoskeletal:  No deformities, BUE and BLE strength normal and equal Skin: warm and dry  Neuro: Left-sided weakness with  slurred speech Psych:  Normal affect    Telemetry:  Telemetry was  personally reviewed and demonstrates:  Sinus rhythm   Relevant CV Studies:  Carotid doppler 09/2017 Final Interpretation: Right Carotid: Velocities in the right ICA are consistent with a 1-39% stenosis.  Left Carotid: Velocities in the left ICA are consistent with a 1-39% stenosis.       Non-hemodynamically significant plaque noted in the CCA.       Technically challenging study due to depth of vessels,       bilaterally.  Vertebrals: Bilateral vertebral arteries demonstrate antegrade flow. Subclavians: Normal flow hemodynamics were seen in bilateral subclavian       arteries.  Stress test 04/2017  Nuclear stress EF: 53%.  There was no ST segment deviation noted during stress.  Small inferolateral defect (mild/moderate intensity) consistent with soft tissue attenuation and/or subendocardial scar. No ischemia  This is a low risk study.  Echo 12/2016 Study Conclusions  - Left ventricle: The cavity size was normal. Wall thickness was   increased in a pattern of mild LVH. Systolic function was normal.   The estimated ejection fraction was in the range of 55% to 60%.   No apical mural thrombus. Wall motion was normal; there were no   regional wall motion abnormalities. Doppler parameters are   consistent with abnormal left ventricular relaxation (grade 1   diastolic dysfunction). The E/e&' ratio is <8, suggesting normal   LV filling pressure. - Left atrium: The atrium was mildly dilated. - Atrial septum: Aneurysmal IAS - cannot exclude a PFO. - Tricuspid valve: There was trivial regurgitation. - Pulmonary arteries: PA peak pressure: 22 mm Hg (S). - Inferior vena cava: The vessel was normal in size. The   respirophasic diameter changes were in the normal range (>= 50%),   consistent with normal central venous pressure.  Impressions:  - Compared to a prior study in 2017, the  LVEF is higher at 55-60%.   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 Consider bubble study to further evaluate.  Cath 10/2015 Left Heart Cath and Cors/Grafts Angiography  Conclusion   Severe native CAD with previously noted 90% ostial LAD stenosis with occluded first diagonal arising from the LAD and a flush and fill phenomena in the mid LAD due to competitive LIMA filling.  Previously noted 95% ostial circumflex stenosis with a patent stent in the calcified segment of the mid circumflex and occluded OM 2 vessel at its origin and antegrade and competitve filling of the AV groove circumflex and marginal branch.  The native RCA which was previously noted to be small caliber nondominant vessel was unable to be visualized.  Patent LIMA graft supplying the mid LAD.  Patent SVG Y graft with the superior limb supplying the first diagonal branch of the LAD and the inferior limb supplying the circumflex marginal 2 vessel.  Previous occlusion of the stented vein graft which had supplied the acute margin branch.  Previous occlusion of the vein graft which had supplied a normal three-vessel.  LVEDP 11 mm Hg.  RECOMMENDATION: Increased medical therapy will be recommended with the addition of nitrates and Ranexa to his current medical regimen.      Laboratory Data:  Chemistry Recent Labs  Lab 02/17/18 0956  NA 140  K 4.1  CL 101  CO2 27  GLUCOSE 138*  BUN 16  CREATININE 1.22  CALCIUM 9.3  GFRNONAA 58*  GFRAA >60  ANIONGAP 12    Hematology Recent Labs  Lab 02/17/18 0956  WBC 6.3  RBC 4.66  HGB 13.5  HCT 43.2  MCV 92.7  MCH 29.0  MCHC 31.3  RDW 13.3  PLT 172    Recent Labs  Lab 02/17/18 1017  TROPIPOC 0.06   Radiology/Studies:  Dg Chest 2 View  Result Date: 02/17/2018 CLINICAL DATA:  Seven day history of arrhythmia in this patient with prior cardioversion for atrial fibrillation, associated with shortness of breath upon  exertion. Prior CABG. Personal history of stroke with LEFT-sided deficits. EXAM: CHEST - 2 VIEW COMPARISON:  04/27/2017, 01/13/2017 and earlier, including high-resolution CT chest 11/04/2016. FINDINGS: Prior sternotomy for CABG. Cardiac silhouette upper normal in size, unchanged. Thoracic aorta mildly tortuous and atherosclerotic, unchanged. Hilar and mediastinal contours otherwise unremarkable. Chronic elevation of the RIGHT hemidiaphragm with chronic scar/atelectasis in the RIGHT MIDDLE LOBE and RIGHT LOWER LOBE, unchanged. Lungs otherwise clear. Pulmonary vascularity normal. No pleural effusions. Degenerative changes and DISH involving the thoracic spine. IMPRESSION: 1.  No acute cardiopulmonary disease. 2. Stable chronic elevation of the RIGHT hemidiaphragm and chronic scar/atelectasis in the RIGHT MIDDLE LOBE and RIGHT LOWER LOBE. Electronically Signed   By: Evangeline Dakin M.D.   On: 02/17/2018 10:41    Assessment and Plan:   1.  Dyspnea on exertion -This is new symptoms in past 2 days.  He used to do household stuff and is small project without any limitation however unable to do so in past 3-4 weeks due to gait instability and now presenting with dyspnea on exertion.  He initially thought that he was out of rhythm and has circulatory issue, leading to ER evaluation.  He denies any exertional chest pain or palpitation.  EKG here shows sinus rhythm with nonspecific T wave inversion anterior laterally. -Last cardiac cath in 2017 showed stable anatomic he had a low risk stress test February 2019. -It is possible to that his symptoms could be anginal equivalent.  He is unable to recall his symptoms prior to his CABG. - Stop Coumadin. Start Heparin when INR less than 2. Plan Cath tomorrow, if non conclusive, consider event monitor to r/u recurrent afib as cause of his DOE.  Repeat echo. Cycle troponin.   2.  Paroxysmal atrial fibrillation -As described above.  Maintaining sinus rhythm here.   Previously discontinued amiodarone due to dyspnea.  Compliant with Coumadin without evidence of bleeding.  INR 2.1. CHADSVASC score of 7.   3.  History of stroke -Patient with residual weakness on left side.  Ongoing arm parenthesis, recommended neurology follow-up however he never did.  Now presenting with worsening gait instability and slurred speech. -Recommended admission and further work-up per admitting team.  4. HTN - BP stable. Continue home meds  5. DM - per primary team   For questions or updates, please contact [place] Please consult [email or web address] for contact info under     Signed, Leanor Kail, PA  02/17/2018 12:35 PM   Agree with note by Robbie Lis PA-C  Mr. Lumpkin has a history of CABG with cath performed by [name] in 2017 revealing patent grafts with normal LV function.  He does have a history of PAF on Coumadin anticoagulation with a INR of 2.1, other problems as outlined.  Since Thanksgiving he has had profound dyspnea on exertion which is new for him.  He has had a prior stroke with left-sided weakness and numbness and therefore is unable to experience chest pain.  His exam is benign.  His EKG shows sinus rhythm with nonspecific ST and T wave changes.  His enzymes are negative.  I favor diagnostic coronary angiography to rule out an ischemic etiology.  We will check a 2D echo today, hold his Coumadin and begin heparin with anticipation of performing cardiac cath tomorrow.  Lorretta Harp, M.D., [place], [facility], Laverta [place] Gerald [address] [place], [place]  [id number]  [phone] 02/17/2018 1:36 PM

## 2018-02-17 NOTE — ED Notes (Signed)
Cardiology at bedside.

## 2018-02-17 NOTE — ED Notes (Signed)
Pt ambulated to bathroom with walker.

## 2018-02-18 ENCOUNTER — Observation Stay (HOSPITAL_BASED_OUTPATIENT_CLINIC_OR_DEPARTMENT_OTHER): Payer: Medicare Other

## 2018-02-18 DIAGNOSIS — I209 Angina pectoris, unspecified: Secondary | ICD-10-CM

## 2018-02-18 DIAGNOSIS — I340 Nonrheumatic mitral (valve) insufficiency: Secondary | ICD-10-CM | POA: Diagnosis not present

## 2018-02-18 DIAGNOSIS — R001 Bradycardia, unspecified: Secondary | ICD-10-CM | POA: Diagnosis not present

## 2018-02-18 DIAGNOSIS — I4819 Other persistent atrial fibrillation: Secondary | ICD-10-CM | POA: Diagnosis present

## 2018-02-18 DIAGNOSIS — Z9842 Cataract extraction status, left eye: Secondary | ICD-10-CM | POA: Diagnosis not present

## 2018-02-18 DIAGNOSIS — R791 Abnormal coagulation profile: Secondary | ICD-10-CM | POA: Diagnosis present

## 2018-02-18 DIAGNOSIS — E291 Testicular hypofunction: Secondary | ICD-10-CM | POA: Diagnosis present

## 2018-02-18 DIAGNOSIS — I361 Nonrheumatic tricuspid (valve) insufficiency: Secondary | ICD-10-CM | POA: Diagnosis not present

## 2018-02-18 DIAGNOSIS — R42 Dizziness and giddiness: Secondary | ICD-10-CM | POA: Diagnosis not present

## 2018-02-18 DIAGNOSIS — I5032 Chronic diastolic (congestive) heart failure: Secondary | ICD-10-CM | POA: Diagnosis present

## 2018-02-18 DIAGNOSIS — G4733 Obstructive sleep apnea (adult) (pediatric): Secondary | ICD-10-CM | POA: Diagnosis present

## 2018-02-18 DIAGNOSIS — M109 Gout, unspecified: Secondary | ICD-10-CM | POA: Diagnosis present

## 2018-02-18 DIAGNOSIS — R079 Chest pain, unspecified: Secondary | ICD-10-CM | POA: Diagnosis not present

## 2018-02-18 DIAGNOSIS — G8929 Other chronic pain: Secondary | ICD-10-CM | POA: Diagnosis present

## 2018-02-18 DIAGNOSIS — G4700 Insomnia, unspecified: Secondary | ICD-10-CM | POA: Diagnosis present

## 2018-02-18 DIAGNOSIS — I480 Paroxysmal atrial fibrillation: Secondary | ICD-10-CM | POA: Diagnosis not present

## 2018-02-18 DIAGNOSIS — I700 Atherosclerosis of aorta: Secondary | ICD-10-CM | POA: Diagnosis present

## 2018-02-18 DIAGNOSIS — E119 Type 2 diabetes mellitus without complications: Secondary | ICD-10-CM | POA: Diagnosis present

## 2018-02-18 DIAGNOSIS — K219 Gastro-esophageal reflux disease without esophagitis: Secondary | ICD-10-CM | POA: Diagnosis present

## 2018-02-18 DIAGNOSIS — Z951 Presence of aortocoronary bypass graft: Secondary | ICD-10-CM | POA: Diagnosis not present

## 2018-02-18 DIAGNOSIS — Z8679 Personal history of other diseases of the circulatory system: Secondary | ICD-10-CM | POA: Diagnosis not present

## 2018-02-18 DIAGNOSIS — I69354 Hemiplegia and hemiparesis following cerebral infarction affecting left non-dominant side: Secondary | ICD-10-CM | POA: Diagnosis not present

## 2018-02-18 DIAGNOSIS — I6930 Unspecified sequelae of cerebral infarction: Secondary | ICD-10-CM | POA: Diagnosis not present

## 2018-02-18 DIAGNOSIS — E785 Hyperlipidemia, unspecified: Secondary | ICD-10-CM | POA: Diagnosis present

## 2018-02-18 DIAGNOSIS — I25810 Atherosclerosis of coronary artery bypass graft(s) without angina pectoris: Secondary | ICD-10-CM | POA: Diagnosis present

## 2018-02-18 DIAGNOSIS — I10 Essential (primary) hypertension: Secondary | ICD-10-CM | POA: Diagnosis not present

## 2018-02-18 DIAGNOSIS — R0609 Other forms of dyspnea: Secondary | ICD-10-CM | POA: Diagnosis not present

## 2018-02-18 DIAGNOSIS — J309 Allergic rhinitis, unspecified: Secondary | ICD-10-CM | POA: Diagnosis present

## 2018-02-18 DIAGNOSIS — I25110 Atherosclerotic heart disease of native coronary artery with unstable angina pectoris: Secondary | ICD-10-CM | POA: Diagnosis present

## 2018-02-18 DIAGNOSIS — E039 Hypothyroidism, unspecified: Secondary | ICD-10-CM | POA: Diagnosis present

## 2018-02-18 DIAGNOSIS — I69328 Other speech and language deficits following cerebral infarction: Secondary | ICD-10-CM | POA: Diagnosis not present

## 2018-02-18 DIAGNOSIS — I110 Hypertensive heart disease with heart failure: Secondary | ICD-10-CM | POA: Diagnosis present

## 2018-02-18 DIAGNOSIS — Z9841 Cataract extraction status, right eye: Secondary | ICD-10-CM | POA: Diagnosis not present

## 2018-02-18 DIAGNOSIS — I2510 Atherosclerotic heart disease of native coronary artery without angina pectoris: Secondary | ICD-10-CM | POA: Diagnosis not present

## 2018-02-18 LAB — BASIC METABOLIC PANEL
Anion gap: 9 (ref 5–15)
BUN: 18 mg/dL (ref 8–23)
CO2: 32 mmol/L (ref 22–32)
Calcium: 9.2 mg/dL (ref 8.9–10.3)
Chloride: 100 mmol/L (ref 98–111)
Creatinine, Ser: 1.14 mg/dL (ref 0.61–1.24)
GFR calc non Af Amer: >60 mL/min (ref >60)
Glucose, Bld: 114 mg/dL — ABNORMAL HIGH (ref 70–99)
Potassium: 3.9 mmol/L (ref 3.5–5.1)
SODIUM: 141 mmol/L (ref 135–145)

## 2018-02-18 LAB — TROPONIN I: Troponin I: 0.03 ng/mL (ref <0.03)

## 2018-02-18 LAB — LIPID PANEL
Cholesterol: 117 mg/dL (ref 0–200)
HDL: 34 mg/dL — AB (ref >40)
LDL Cholesterol: 57 mg/dL (ref 0–99)
TRIGLYCERIDES: 130 mg/dL (ref <150)
Total CHOL/HDL Ratio: 3.4 RATIO
VLDL: 26 mg/dL (ref 0–40)

## 2018-02-18 LAB — ECHOCARDIOGRAM COMPLETE
Height: 75 in
Weight: 4045.88 oz

## 2018-02-18 LAB — PROTIME-INR
INR: 2.21
Prothrombin Time: 24.2 seconds — ABNORMAL HIGH (ref 11.4–15.2)

## 2018-02-18 LAB — GLUCOSE, CAPILLARY
Glucose-Capillary: 96 mg/dL (ref 70–99)
Glucose-Capillary: 93 mg/dL (ref 70–99)
Glucose-Capillary: 84 mg/dL (ref 70–99)
Glucose-Capillary: 89 mg/dL (ref 70–99)
Glucose-Capillary: 117 mg/dL — ABNORMAL HIGH (ref 70–99)

## 2018-02-18 MED ORDER — PANTOPRAZOLE SODIUM 40 MG PO TBEC
40.0000 mg | DELAYED_RELEASE_TABLET | Freq: Every day | ORAL | Status: DC
  Administered 2018-02-19: 40 mg via ORAL
  Filled 2018-02-18: qty 1

## 2018-02-18 MED ORDER — SODIUM CHLORIDE 0.9 % WEIGHT BASED INFUSION
1.0000 mL/kg/h | INTRAVENOUS | Status: DC
  Administered 2018-02-19: 1 mL/kg/h via INTRAVENOUS

## 2018-02-18 MED ORDER — ASPIRIN 81 MG PO CHEW
81.0000 mg | CHEWABLE_TABLET | ORAL | Status: AC
  Administered 2018-02-19: 81 mg via ORAL
  Filled 2018-02-18: qty 1

## 2018-02-18 MED ORDER — PERFLUTREN LIPID MICROSPHERE
1.0000 mL | INTRAVENOUS | Status: AC | PRN
  Administered 2018-02-18: 3 mL via INTRAVENOUS
  Filled 2018-02-18: qty 10

## 2018-02-18 MED ORDER — MAGNESIUM HYDROXIDE 400 MG/5ML PO SUSP
30.0000 mL | ORAL | Status: DC | PRN
  Administered 2018-02-18: 30 mL via ORAL
  Filled 2018-02-18: qty 30

## 2018-02-18 MED ORDER — SODIUM CHLORIDE 0.9 % WEIGHT BASED INFUSION
3.0000 mL/kg/h | INTRAVENOUS | Status: DC
  Administered 2018-02-19: 3 mL/kg/h via INTRAVENOUS

## 2018-02-18 MED ORDER — ATORVASTATIN CALCIUM 80 MG PO TABS
80.0000 mg | ORAL_TABLET | Freq: Every day | ORAL | Status: DC
  Administered 2018-02-19: 80 mg via ORAL
  Filled 2018-02-18: qty 1

## 2018-02-18 NOTE — H&P (View-Only) (Signed)
Progress Note  Patient Name: [name] Date of Encounter: 02/18/2018  Primary Cardiologist: Nelva Bush, MD   Subjective   No complaints overnight.  Sinus rhythm on telemetry.  Denies chest pain.  He is on supplemental oxygen.  His INR is 2.2 today off of Coumadin.  We will start IV heparin once his INR is less than 2 and plan diagnostic coronary angiography.  Inpatient Medications    Scheduled Meds: . allopurinol  200 mg Oral Daily  . [START ON 02/19/2018] aspirin  81 mg Oral Pre-Cath  . aspirin EC  81 mg Oral Daily  . atorvastatin  80 mg Oral Daily  . benazepril  15 mg Oral Daily  . insulin aspart  0-15 Units Subcutaneous TID WC  . isosorbide mononitrate  15 mg Oral Daily  . levothyroxine  112 mcg Oral QAC breakfast  . metoprolol tartrate  12.5 mg Oral BID  . pantoprazole  40 mg Oral q morning - 10a  . sodium chloride flush  3 mL Intravenous Q12H  . sodium chloride flush  3 mL Intravenous Q12H  . traMADol  50 mg Oral BID   Continuous Infusions: . sodium chloride    . sodium chloride    . sodium chloride    . [START ON 02/19/2018] sodium chloride     Followed by  . [START ON 02/19/2018] sodium chloride     PRN Meds: sodium chloride, sodium chloride, acetaminophen, morphine injection, nitroGLYCERIN, ondansetron (ZOFRAN) IV, sodium chloride flush, sodium chloride flush   Vital Signs    Vitals:   02/17/18 1949 02/17/18 2148 02/17/18 2335 02/18/18 0437  BP: 138/76  (!) 141/83 124/85  Pulse: 65  63 (!) 58  Resp: 13 18 14 13   Temp: 98.7 F (37.1 C)  98.4 F (36.9 C) 97.9 F (36.6 C)  TempSrc: Oral  Oral Oral  SpO2: 94% 99% 100% 99%  Weight:      Height:        Intake/Output Summary (Last 24 hours) at 02/18/2018 0928 Last data filed at 02/18/2018 0727 Gross per 24 hour  Intake 813.19 ml  Output 450 ml  Net 363.19 ml   Filed Weights   02/17/18 0954 02/17/18 1530  Weight: 113.4 kg 114.7 kg    Telemetry    Sinus rhythm- Personally Reviewed  ECG      Sinus rhythm at 61 with left ventricular hypertrophy and nonspecific ST and T wave changes.- Personally Reviewed  Physical Exam   GEN: No acute distress.   Neck: No JVD Cardiac: RRR, no murmurs, rubs, or gallops.  Respiratory: Clear to auscultation bilaterally. GI: Soft, nontender, non-distended  MS: No edema; No deformity. Neuro:  Nonfocal  Psych: Normal affect   Labs    Chemistry Recent Labs  Lab 02/17/18 0956 02/18/18 0236  NA 140 141  K 4.1 3.9  CL 101 100  CO2 27 32  GLUCOSE 138* 114*  BUN 16 18  CREATININE 1.22 1.14  CALCIUM 9.3 9.2  GFRNONAA 58* >60  GFRAA >60 >60  ANIONGAP 12 9     Hematology Recent Labs  Lab 02/17/18 0956  WBC 6.3  RBC 4.66  HGB 13.5  HCT 43.2  MCV 92.7  MCH 29.0  MCHC 31.3  RDW 13.3  PLT 172    Cardiac Enzymes Recent Labs  Lab 02/17/18 1529 02/17/18 2021 02/18/18 0236  TROPONINI 0.04* 0.04* 0.03*    Recent Labs  Lab 02/17/18 1017 02/17/18 1421  TROPIPOC 0.06 0.04  BNPNo results for input(s): BNP, PROBNP in the last 168 hours.   DDimer No results for input(s): DDIMER in the last 168 hours.   Radiology    Dg Chest 2 View  Result Date: 02/17/2018 CLINICAL DATA:  Seven day history of arrhythmia in this patient with prior cardioversion for atrial fibrillation, associated with shortness of breath upon exertion. Prior CABG. Personal history of stroke with LEFT-sided deficits. EXAM: CHEST - 2 VIEW COMPARISON:  04/27/2017, 01/13/2017 and earlier, including high-resolution CT chest 11/04/2016. FINDINGS: Prior sternotomy for CABG. Cardiac silhouette upper normal in size, unchanged. Thoracic aorta mildly tortuous and atherosclerotic, unchanged. Hilar and mediastinal contours otherwise unremarkable. Chronic elevation of the RIGHT hemidiaphragm with chronic scar/atelectasis in the RIGHT MIDDLE LOBE and RIGHT LOWER LOBE, unchanged. Lungs otherwise clear. Pulmonary vascularity normal. No pleural effusions. Degenerative changes  and DISH involving the thoracic spine. IMPRESSION: 1.  No acute cardiopulmonary disease. 2. Stable chronic elevation of the RIGHT hemidiaphragm and chronic scar/atelectasis in the RIGHT MIDDLE LOBE and RIGHT LOWER LOBE. Electronically Signed   By: Evangeline Dakin M.D.   On: 02/17/2018 10:41    Cardiac Studies   2D echo pending  Patient Profile     72 y.o. male Caucasian male with a history of CAD status post CABG in 9357, diastolic heart failure, paroxysmal atrial fibrillation, prior stroke with left-sided weakness, hypertension, hyperlipidemia and obstructive sleep apnea.  He did undergo diagnostic coronary angiography 8/17 revealing stable anatomy.  The graft to the acute marginal branch was occluded.  Medical therapy was recommended.  He was admitted yesterday because of increasing dyspnea on exertion since Thanksgiving for unclear reasons.  He denies chest pain.  EKG shows no acute changes.  Enzymes are negative.  His INR is therapeutic at 2.2.  Assessment & Plan    1: Coronary artery disease- history of CABG in 2004, stable cath 8/17 with new onset dyspnea concerning for "anginal equivalent".  His INR is elevated 2.2.  He will need diagnostic coronary angiography which will occur once his INR falls to the 1.7/1.8 range.  2: Paroxysmal atrial fibrillation-on Coumadin anticoagulation maintaining sinus rhythm with an INR of 2.2.  His Coumadin is on hold in anticipation for left heart cath.  We will start heparin once his INR falls below 2.  3: Essential hypertension- stable on a current medications   We will follow his PT/INR on a daily basis.  He does have precath orders written.  Once his INR falls below 2 IV heparin will be started and cath will be pursued once his INR is in the 1.8 range.  2D echo is pending.     For questions or updates, please contact [place] Please consult [email or web address] for contact info under        Signed, Quay Burow, MD  02/18/2018, 9:28 AM

## 2018-02-18 NOTE — Progress Notes (Signed)
PROGRESS NOTE  [name] ZOX:[id number] DOB: [birth year] DOA: 02/17/2018 PCP: Debbrah Alar, NP  HPI/Recap of past 24 hours: [name] is a 72 y.o. male with medical history significant of DM2; CVA with resultant L hemiplegia; HLD; P. afib on Coumadin; OSA not on CPAP; HTN; hypothyroidism; CAD s/p CABG; diastolic CHF; and chronic pain presenting with CP/SOB/fatigue.  He thinks there is a blockage somewhere.  +SOB with exertion, only recently.  Admitted for new onset dyspnea concerning for anginal equivalent.  02/18/18: Patient seen and examined at his bedside.  Reports some shortness of breath with minimal exertion.  Denies chest pain at this time.   Assessment/Plan: Principal Problem:   Ischemic chest pain (HCC) Active Problems:   Hyperlipidemia LDL goal <70   Essential hypertension   History of stroke with residual deficit   Type 2 diabetes mellitus without complication, without long-term current use of insulin ([place])   Long term current use of anticoagulant therapy   Hypothyroidism, acquired   Chronic diastolic heart failure (HCC)   Persistent atrial fibrillation   Chest pain rule out ACS Significant history of cardiac disease which include coronary artery disease status post CABG, paroxysmal A. fib on Coumadin diastolic CHF, previous heart cath Troponin flat x3 Independent review twelve-lead EKG which revealed sinus rhythm with nonspecific ST-T changes Cardiology consulted with plan for left heart cath Left heart cath on hold due to INR being greater than 1.8 Start heparin drip once INR is less than 2.0  Chronic diastolic CHF Last 2D echo done on 02/18/2018 revealed preserved LVEF with grade 1 diastolic dysfunction Continue cardiac medications  Paroxysmal A. fib Currently rate controlled on Lopressor Coumadin is on hold due to possible left heart cath Continue to monitor INR Once INR is less than 2.0 start heparin drip  Coronary artery disease status  post CABG  Continue current medications which include aspirin, Lipitor 80 mg daily, also on benazepril 50 mg daily, Imdur 50 mg daily, to Lopressor 12.5 mg twice daily  History of CVA with left hemiplegia Fall precautions PT to assess Continue aspirin and Lipitor Coumadin is on hold due to planned procedure  Hypothyroidism Continue levothyroxine  GERD Continue Protonix  Type II diabetes Continue insulin sliding scale A1c 6.7 on 01/27/2018  Gout Continue allopurinol  DVT prophylaxis:  SCDs Code Status:  Full  Disposition Plan:  Home when cardiology signs off.  Planned left heart cath. Consults called: Cardiology       Objective: Vitals:   02/17/18 2148 02/17/18 2335 02/18/18 0437 02/18/18 0957  BP:  (!) 141/83 124/85 122/80  Pulse:  63 (!) 58 (!) 55  Resp: 18 14 13 15   Temp:  98.4 F (36.9 C) 97.9 F (36.6 C) 97.9 F (36.6 C)  TempSrc:  Oral Oral Oral  SpO2: 99% 100% 99% 97%  Weight:      Height:        Intake/Output Summary (Last 24 hours) at 02/18/2018 1337 Last data filed at 02/18/2018 1000 Gross per 24 hour  Intake 1053.19 ml  Output 450 ml  Net 603.19 ml   Filed Weights   02/17/18 0954 02/17/18 1530  Weight: 113.4 kg 114.7 kg    Exam:  . General: 72 y.o. year-old male well developed well nourished.  Appears mildly uncomfortable due to dyspnea.  Alert and oriented x3. . Cardiovascular: Regular rate and rhythm with no rubs or gallops.  No thyromegaly or JVD noted.   Marland Kitchen Respiratory: Clear to auscultation with no wheezes  or rales. Good inspiratory effort. . Abdomen: Soft nontender nondistended with normal bowel sounds x4 quadrants. . Musculoskeletal: Trace lower extremity edema. 2/4 pulses in all 4 extremities. Marland Kitchen Psychiatry: Mood is appropriate for condition and setting   Data Reviewed: CBC: Recent Labs  Lab 02/17/18 0956  WBC 6.3  HGB 13.5  HCT 43.2  MCV 92.7  PLT 250   Basic Metabolic Panel: Recent Labs  Lab 02/17/18 0956  02/18/18 0236  NA 140 141  K 4.1 3.9  CL 101 100  CO2 27 32  GLUCOSE 138* 114*  BUN 16 18  CREATININE 1.22 1.14  CALCIUM 9.3 9.2   GFR: Estimated Creatinine Clearance: 77.7 mL/min (by C-G formula based on SCr of 1.14 mg/dL). Liver Function Tests: No results for input(s): AST, ALT, ALKPHOS, BILITOT, PROT, ALBUMIN in the last 168 hours. No results for input(s): LIPASE, AMYLASE in the last 168 hours. No results for input(s): AMMONIA in the last 168 hours. Coagulation Profile: Recent Labs  Lab 02/17/18 0956 02/18/18 0236  INR 2.01 2.21   Cardiac Enzymes: Recent Labs  Lab 02/17/18 1529 02/17/18 2021 02/18/18 0236  TROPONINI 0.04* 0.04* 0.03*   BNP (last 3 results) No results for input(s): PROBNP in the last 8760 hours. HbA1C: No results for input(s): HGBA1C in the last 72 hours. CBG: Recent Labs  Lab 02/17/18 1504 02/17/18 1717 02/17/18 2126 02/18/18 0804 02/18/18 1132  GLUCAP 96 141* 117* 93 84   Lipid Profile: Recent Labs    02/18/18 0236  CHOL 117  HDL 34*  LDLCALC 57  TRIG 130  CHOLHDL 3.4   Thyroid Function Tests: No results for input(s): TSH, T4TOTAL, FREET4, T3FREE, THYROIDAB in the last 72 hours. Anemia Panel: No results for input(s): VITAMINB12, FOLATE, FERRITIN, TIBC, IRON, RETICCTPCT in the last 72 hours. Urine analysis:    Component Value Date/Time   COLORURINE YELLOW 04/06/2017 0838   APPEARANCEUR CLEAR 04/06/2017 0838   LABSPEC 1.023 04/06/2017 0838   PHURINE 5.0 04/06/2017 0838   GLUCOSEU NEGATIVE 04/06/2017 0838   HGBUR NEGATIVE 04/06/2017 0838   BILIRUBINUR neg 04/27/2017 1258   KETONESUR NEGATIVE 04/06/2017 0838   PROTEINUR neg 04/27/2017 1258   PROTEINUR NEGATIVE 04/06/2017 0838   UROBILINOGEN 0.2 04/27/2017 1258   UROBILINOGEN 1.0 11/25/2014 1806   NITRITE neg 04/27/2017 1258   NITRITE NEGATIVE 04/06/2017 0838   LEUKOCYTESUR Negative 04/27/2017 1258   Sepsis Labs: @LABRCNTIP (procalcitonin:4,lacticidven:4)  ) Recent  Results (from the past 240 hour(s))  MRSA PCR Screening     Status: None   Collection Time: 02/17/18  4:36 PM  Result Value Ref Range Status   MRSA by PCR NEGATIVE NEGATIVE Final    Comment:        The GeneXpert MRSA Assay (FDA approved for NASAL specimens only), is one component of a comprehensive MRSA colonization surveillance program. It is not intended to diagnose MRSA infection nor to guide or monitor treatment for MRSA infections. Performed at [facility], [place] [address], [place], [place] [address]       Studies: No results found.  Scheduled Meds: . allopurinol  200 mg Oral Daily  . [START ON 02/19/2018] aspirin  81 mg Oral Pre-Cath  . aspirin EC  81 mg Oral Daily  . [START ON 02/19/2018] atorvastatin  80 mg Oral q1800  . benazepril  15 mg Oral Daily  . insulin aspart  0-15 Units Subcutaneous TID WC  . isosorbide mononitrate  15 mg Oral Daily  . levothyroxine  112 mcg Oral  QAC breakfast  . metoprolol tartrate  12.5 mg Oral BID  . [START ON 02/19/2018] pantoprazole  40 mg Oral QHS  . sodium chloride flush  3 mL Intravenous Q12H  . sodium chloride flush  3 mL Intravenous Q12H  . traMADol  50 mg Oral BID    Continuous Infusions: . sodium chloride    . sodium chloride    . [START ON 02/19/2018] sodium chloride     Followed by  . [START ON 02/19/2018] sodium chloride       LOS: 0 days     Kayleen Memos, MD [facility] Pager [phone]  If 7PM-7AM, please contact night-coverage [email or web address] Password [email or web address] 02/18/2018, 1:37 PM

## 2018-02-18 NOTE — Progress Notes (Signed)
Progress Note  Patient Name: [name] Date of Encounter: 02/18/2018  Primary Cardiologist: Nelva Bush, MD   Subjective   No complaints overnight.  Sinus rhythm on telemetry.  Denies chest pain.  He is on supplemental oxygen.  His INR is 2.2 today off of Coumadin.  We will start IV heparin once his INR is less than 2 and plan diagnostic coronary angiography.  Inpatient Medications    Scheduled Meds: . allopurinol  200 mg Oral Daily  . [START ON 02/19/2018] aspirin  81 mg Oral Pre-Cath  . aspirin EC  81 mg Oral Daily  . atorvastatin  80 mg Oral Daily  . benazepril  15 mg Oral Daily  . insulin aspart  0-15 Units Subcutaneous TID WC  . isosorbide mononitrate  15 mg Oral Daily  . levothyroxine  112 mcg Oral QAC breakfast  . metoprolol tartrate  12.5 mg Oral BID  . pantoprazole  40 mg Oral q morning - 10a  . sodium chloride flush  3 mL Intravenous Q12H  . sodium chloride flush  3 mL Intravenous Q12H  . traMADol  50 mg Oral BID   Continuous Infusions: . sodium chloride    . sodium chloride    . sodium chloride    . [START ON 02/19/2018] sodium chloride     Followed by  . [START ON 02/19/2018] sodium chloride     PRN Meds: sodium chloride, sodium chloride, acetaminophen, morphine injection, nitroGLYCERIN, ondansetron (ZOFRAN) IV, sodium chloride flush, sodium chloride flush   Vital Signs    Vitals:   02/17/18 1949 02/17/18 2148 02/17/18 2335 02/18/18 0437  BP: 138/76  (!) 141/83 124/85  Pulse: 65  63 (!) 58  Resp: 13 18 14 13   Temp: 98.7 F (37.1 C)  98.4 F (36.9 C) 97.9 F (36.6 C)  TempSrc: Oral  Oral Oral  SpO2: 94% 99% 100% 99%  Weight:      Height:        Intake/Output Summary (Last 24 hours) at 02/18/2018 0928 Last data filed at 02/18/2018 0727 Gross per 24 hour  Intake 813.19 ml  Output 450 ml  Net 363.19 ml   Filed Weights   02/17/18 0954 02/17/18 1530  Weight: 113.4 kg 114.7 kg    Telemetry    Sinus rhythm- Personally Reviewed  ECG      Sinus rhythm at 61 with left ventricular hypertrophy and nonspecific ST and T wave changes.- Personally Reviewed  Physical Exam   GEN: No acute distress.   Neck: No JVD Cardiac: RRR, no murmurs, rubs, or gallops.  Respiratory: Clear to auscultation bilaterally. GI: Soft, nontender, non-distended  MS: No edema; No deformity. Neuro:  Nonfocal  Psych: Normal affect   Labs    Chemistry Recent Labs  Lab 02/17/18 0956 02/18/18 0236  NA 140 141  K 4.1 3.9  CL 101 100  CO2 27 32  GLUCOSE 138* 114*  BUN 16 18  CREATININE 1.22 1.14  CALCIUM 9.3 9.2  GFRNONAA 58* >60  GFRAA >60 >60  ANIONGAP 12 9     Hematology Recent Labs  Lab 02/17/18 0956  WBC 6.3  RBC 4.66  HGB 13.5  HCT 43.2  MCV 92.7  MCH 29.0  MCHC 31.3  RDW 13.3  PLT 172    Cardiac Enzymes Recent Labs  Lab 02/17/18 1529 02/17/18 2021 02/18/18 0236  TROPONINI 0.04* 0.04* 0.03*    Recent Labs  Lab 02/17/18 1017 02/17/18 1421  TROPIPOC 0.06 0.04  BNPNo results for input(s): BNP, PROBNP in the last 168 hours.   DDimer No results for input(s): DDIMER in the last 168 hours.   Radiology    Dg Chest 2 View  Result Date: 02/17/2018 CLINICAL DATA:  Seven day history of arrhythmia in this patient with prior cardioversion for atrial fibrillation, associated with shortness of breath upon exertion. Prior CABG. Personal history of stroke with LEFT-sided deficits. EXAM: CHEST - 2 VIEW COMPARISON:  04/27/2017, 01/13/2017 and earlier, including high-resolution CT chest 11/04/2016. FINDINGS: Prior sternotomy for CABG. Cardiac silhouette upper normal in size, unchanged. Thoracic aorta mildly tortuous and atherosclerotic, unchanged. Hilar and mediastinal contours otherwise unremarkable. Chronic elevation of the RIGHT hemidiaphragm with chronic scar/atelectasis in the RIGHT MIDDLE LOBE and RIGHT LOWER LOBE, unchanged. Lungs otherwise clear. Pulmonary vascularity normal. No pleural effusions. Degenerative changes  and DISH involving the thoracic spine. IMPRESSION: 1.  No acute cardiopulmonary disease. 2. Stable chronic elevation of the RIGHT hemidiaphragm and chronic scar/atelectasis in the RIGHT MIDDLE LOBE and RIGHT LOWER LOBE. Electronically Signed   By: Evangeline Dakin M.D.   On: 02/17/2018 10:41    Cardiac Studies   2D echo pending  Patient Profile     72 y.o. male Caucasian male with a history of CAD status post CABG in 7564, diastolic heart failure, paroxysmal atrial fibrillation, prior stroke with left-sided weakness, hypertension, hyperlipidemia and obstructive sleep apnea.  He did undergo diagnostic coronary angiography 8/17 revealing stable anatomy.  The graft to the acute marginal branch was occluded.  Medical therapy was recommended.  He was admitted yesterday because of increasing dyspnea on exertion since Thanksgiving for unclear reasons.  He denies chest pain.  EKG shows no acute changes.  Enzymes are negative.  His INR is therapeutic at 2.2.  Assessment & Plan    1: Coronary artery disease- history of CABG in 2004, stable cath 8/17 with new onset dyspnea concerning for "anginal equivalent".  His INR is elevated 2.2.  He will need diagnostic coronary angiography which will occur once his INR falls to the 1.7/1.8 range.  2: Paroxysmal atrial fibrillation-on Coumadin anticoagulation maintaining sinus rhythm with an INR of 2.2.  His Coumadin is on hold in anticipation for left heart cath.  We will start heparin once his INR falls below 2.  3: Essential hypertension- stable on a current medications   We will follow his PT/INR on a daily basis.  He does have precath orders written.  Once his INR falls below 2 IV heparin will be started and cath will be pursued once his INR is in the 1.8 range.  2D echo is pending.     For questions or updates, please contact [place] Please consult [email or web address] for contact info under        Signed, Quay Burow, MD  02/18/2018, 9:28 AM

## 2018-02-18 NOTE — Progress Notes (Signed)
  Echocardiogram 2D Echocardiogram with definity has been performed.  [name] M 02/18/2018, 9:56 AM

## 2018-02-18 NOTE — Progress Notes (Signed)
ANTICOAGULATION CONSULT NOTE - Initial Consult  Pharmacy Consult for heparin  Indication: chest pain/ACS  No Known Allergies  Patient Measurements: Height: 6\' 3"  (190.5 cm) Weight: 252 lb 13.9 oz (114.7 kg) IBW/kg (Calculated) : 84.5 Heparin Dosing Weight: 108kg  Vital Signs: Temp: 97.9 F (36.6 C) (12/05 0437) Temp Source: Oral (12/05 0437) BP: 124/85 (12/05 0437) Pulse Rate: 58 (12/05 0437)  Labs: Recent Labs    02/17/18 0956 02/17/18 1529 02/17/18 2021 02/18/18 0236  HGB 13.5  --   --   --   HCT 43.2  --   --   --   PLT 172  --   --   --   LABPROT 22.5*  --   --  24.2*  INR 2.01  --   --  2.21  CREATININE 1.22  --   --  1.14  TROPONINI  --  0.04* 0.04* 0.03*    Estimated Creatinine Clearance: 77.7 mL/min (by C-G formula based on SCr of 1.14 mg/dL).   Medical History: Past Medical History:  Diagnosis Date  . Acute encephalopathy 12/12/2015  . Acute respiratory failure with hypoxia and hypercapnia ([place]) 12/12/2015  . Allergic rhinitis 02/13/2010   Qualifier: Diagnosis of  By: Wynona Luna   . Angina at rest [facility]) 10/22/2015  . Ataxia 08/10/2013  . B12 deficiency 08/11/2013  . BACK PAIN, LUMBAR 01/29/2009   Qualifier: Diagnosis of  By: Wynona Luna   . BENIGN POSITIONAL VERTIGO 11/23/2009   Qualifier: Diagnosis of  By: Wynona Luna   . Chronic diastolic heart failure ([place]) 04/24/2015  . Chronic pain    left sided-[place]  . Coronary 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 ([place]) 11/04/2015   s/p CABG 2004; CFX DES 2011  . Essential hypertension 12/12/2006   Qualifier: Diagnosis of  By: [name] RMA, Lucy    . GERD 12/12/2006   Qualifier: Diagnosis of  By: [name] RMA, Lucy    . GOUT 12/12/2006   Qualifier: Diagnosis of  By: Reatha Armour, Lucy    . Hyperlipidemia LDL goal <70 12/12/2006   Qualifier: Diagnosis of  By: [name] RMA, Lucy     . Hypogonadism male 04/10/2011  . Hypothyroidism, acquired 02/15/2013  . Insomnia 05/21/2013  . Long term  current use of anticoagulant therapy 06/24/2011  . Obstructive sleep apnea-Failed CPAP 06/17/2007   Failed CPAP Using O 2 for sleep   . Paroxysmal atrial fibrillation ([place]) 12/06/2016  . Stroke (cerebrum) ([place]) 01/13/2017   also 1996; resultant hemiplegia of dominant side  . Thoracic aorta atherosclerosis ([place]) 04/27/2017  . Tubular adenoma of colon 05/2010  . Type 2 diabetes mellitus without complication, without long-term current use of insulin ([place]) 10/05/2007   Qualifier: Diagnosis of  By: Wynona Luna   . Vasovagal syncope     Assessment: 72 yo male with CP to start heparin when INR < 2.0. He is on coumadin PTA for afib/CVA. Plans for cath today 12/5 but may be postponed as INR up to 2.2 this morning. Will continue to hold warfarin and heparin until INR down.   No cbc this morning.   Goal of Therapy:  Heparin level 0.3-0.7 units/ml Monitor platelets by anticoagulation protocol: Yes   Plan:  -Check INR in am and begin heparin when INR < 2.0  Erin Hearing PharmD., BCPS Clinical Pharmacist 02/18/2018 8:49 AM

## 2018-02-19 ENCOUNTER — Encounter (HOSPITAL_COMMUNITY): Payer: Self-pay | Admitting: Cardiology

## 2018-02-19 ENCOUNTER — Inpatient Hospital Stay (HOSPITAL_COMMUNITY): Payer: Medicare Other

## 2018-02-19 ENCOUNTER — Encounter (HOSPITAL_COMMUNITY)
Admission: EM | Disposition: A | Discharge status: Home / Self Care | Payer: Self-pay | Source: Home / Self Care | Attending: Internal Medicine

## 2018-02-19 DIAGNOSIS — R0609 Other forms of dyspnea: Secondary | ICD-10-CM

## 2018-02-19 HISTORY — PX: LEFT HEART CATH AND CORS/GRAFTS ANGIOGRAPHY: CATH118250

## 2018-02-19 LAB — GLUCOSE, CAPILLARY
GLUCOSE-CAPILLARY: 113 mg/dL — AB (ref 70–99)
Glucose-Capillary: 86 mg/dL (ref 70–99)
Glucose-Capillary: 88 mg/dL (ref 70–99)
Glucose-Capillary: 138 mg/dL — ABNORMAL HIGH (ref 70–99)

## 2018-02-19 LAB — CBC
HCT: 40 % (ref 39.0–52.0)
Hemoglobin: 12.4 g/dL — ABNORMAL LOW (ref 13.0–17.0)
MCH: 28.6 pg (ref 26.0–34.0)
MCHC: 31 g/dL (ref 30.0–36.0)
MCV: 92.2 fL (ref 80.0–100.0)
NRBC: 0 % (ref 0.0–0.2)
Platelets: 155 10*3/uL (ref 150–400)
RBC: 4.34 MIL/uL (ref 4.22–5.81)
RDW: 13.3 % (ref 11.5–15.5)
WBC: 6.8 10*3/uL (ref 4.0–10.5)

## 2018-02-19 LAB — BASIC METABOLIC PANEL
ANION GAP: 8 (ref 5–15)
BUN: 21 mg/dL (ref 8–23)
CO2: 32 mmol/L (ref 22–32)
Calcium: 9.1 mg/dL (ref 8.9–10.3)
Chloride: 101 mmol/L (ref 98–111)
Creatinine, Ser: 1.27 mg/dL — ABNORMAL HIGH (ref 0.61–1.24)
GFR calc Af Amer: >60 mL/min (ref >60)
GFR calc non Af Amer: 55 mL/min — ABNORMAL LOW (ref >60)
Glucose, Bld: 110 mg/dL — ABNORMAL HIGH (ref 70–99)
Potassium: 3.9 mmol/L (ref 3.5–5.1)
SODIUM: 141 mmol/L (ref 135–145)

## 2018-02-19 LAB — PROTIME-INR
INR: 1.97
Prothrombin Time: 22.2 seconds — ABNORMAL HIGH (ref 11.4–15.2)

## 2018-02-19 SURGERY — LEFT HEART CATH AND CORS/GRAFTS ANGIOGRAPHY
Anesthesia: LOCAL

## 2018-02-19 MED ORDER — HEPARIN (PORCINE) 25000 UT/250ML-% IV SOLN
1800.0000 [IU]/h | INTRAVENOUS | Status: DC
  Administered 2018-02-19: 1500 [IU]/h via INTRAVENOUS

## 2018-02-19 MED ORDER — LIDOCAINE HCL (PF) 1 % IJ SOLN
INTRAMUSCULAR | Status: AC
  Filled 2018-02-19: qty 30

## 2018-02-19 MED ORDER — MIDAZOLAM HCL 2 MG/2ML IJ SOLN
INTRAMUSCULAR | Status: AC
  Filled 2018-02-19: qty 2

## 2018-02-19 MED ORDER — IOPAMIDOL (ISOVUE-370) INJECTION 76%
100.0000 mL | Freq: Once | INTRAVENOUS | Status: AC | PRN
  Administered 2018-02-19: 100 mL via INTRAVENOUS

## 2018-02-19 MED ORDER — HEPARIN (PORCINE) IN NACL 1000-0.9 UT/500ML-% IV SOLN
INTRAVENOUS | Status: AC
  Filled 2018-02-19: qty 1000

## 2018-02-19 MED ORDER — FENTANYL CITRATE (PF) 100 MCG/2ML IJ SOLN
INTRAMUSCULAR | Status: DC | PRN
  Administered 2018-02-19: 25 ug via INTRAVENOUS

## 2018-02-19 MED ORDER — HEPARIN (PORCINE) 25000 UT/250ML-% IV SOLN
1500.0000 [IU]/h | INTRAVENOUS | Status: DC
  Filled 2018-02-19: qty 250

## 2018-02-19 MED ORDER — WARFARIN - PHARMACIST DOSING INPATIENT: Freq: Every day | Status: DC

## 2018-02-19 MED ORDER — IOPAMIDOL (ISOVUE-370) INJECTION 76%
INTRAVENOUS | Status: AC
  Filled 2018-02-19: qty 100

## 2018-02-19 MED ORDER — SODIUM CHLORIDE 0.9 % IV SOLN: 250.0000 mL | INTRAVENOUS | Status: DC | PRN

## 2018-02-19 MED ORDER — WARFARIN SODIUM 3 MG PO TABS
3.0000 mg | ORAL_TABLET | Freq: Once | ORAL | Status: AC
  Administered 2018-02-19: 3 mg via ORAL
  Filled 2018-02-19: qty 1

## 2018-02-19 MED ORDER — HEPARIN (PORCINE) IN NACL 1000-0.9 UT/500ML-% IV SOLN
INTRAVENOUS | Status: DC | PRN
  Administered 2018-02-19 (×2): 500 mL

## 2018-02-19 MED ORDER — FENTANYL CITRATE (PF) 100 MCG/2ML IJ SOLN
INTRAMUSCULAR | Status: AC
  Filled 2018-02-19: qty 2

## 2018-02-19 MED ORDER — VERAPAMIL HCL 2.5 MG/ML IV SOLN
INTRAVENOUS | Status: AC
  Filled 2018-02-19: qty 2

## 2018-02-19 MED ORDER — IOHEXOL 350 MG/ML SOLN
INTRAVENOUS | Status: DC | PRN
  Administered 2018-02-19: 100 mL via INTRA_ARTERIAL

## 2018-02-19 MED ORDER — MECLIZINE HCL 25 MG PO TABS
25.0000 mg | ORAL_TABLET | Freq: Three times a day (TID) | ORAL | Status: DC | PRN
  Administered 2018-02-20: 25 mg via ORAL
  Filled 2018-02-19 (×2): qty 1

## 2018-02-19 MED ORDER — MIDAZOLAM HCL 2 MG/2ML IJ SOLN
INTRAMUSCULAR | Status: DC | PRN
  Administered 2018-02-19: 1 mg via INTRAVENOUS

## 2018-02-19 MED ORDER — SODIUM CHLORIDE 0.9% FLUSH
3.0000 mL | Freq: Two times a day (BID) | INTRAVENOUS | Status: DC
  Administered 2018-02-19: 3 mL via INTRAVENOUS

## 2018-02-19 MED ORDER — SODIUM CHLORIDE 0.9 % WEIGHT BASED INFUSION
1.0000 mL/kg/h | INTRAVENOUS | Status: AC
  Administered 2018-02-19 (×2): 1 mL/kg/h via INTRAVENOUS

## 2018-02-19 MED ORDER — VERAPAMIL HCL 2.5 MG/ML IV SOLN
INTRAVENOUS | Status: DC | PRN
  Administered 2018-02-19: 10 mL via INTRA_ARTERIAL

## 2018-02-19 MED ORDER — HEPARIN SODIUM (PORCINE) 1000 UNIT/ML IJ SOLN
INTRAMUSCULAR | Status: DC | PRN
  Administered 2018-02-19: 5500 [IU] via INTRAVENOUS

## 2018-02-19 MED ORDER — LIDOCAINE HCL (PF) 1 % IJ SOLN
INTRAMUSCULAR | Status: DC | PRN
  Administered 2018-02-19: 2 mL

## 2018-02-19 MED ORDER — HEPARIN SODIUM (PORCINE) 1000 UNIT/ML IJ SOLN
INTRAMUSCULAR | Status: AC
  Filled 2018-02-19: qty 1

## 2018-02-19 MED ORDER — WARFARIN SODIUM 2.5 MG PO TABS: 2.5000 mg | ORAL_TABLET | Freq: Once | ORAL | Status: DC

## 2018-02-19 MED ORDER — SODIUM CHLORIDE 0.9% FLUSH: 3.0000 mL | INTRAVENOUS | Status: DC | PRN

## 2018-02-19 SURGICAL SUPPLY — 11 items
CATH INFINITI 5 FR IM (CATHETERS) ×1 IMPLANT
CATH INFINITI 5FR MULTPACK ANG (CATHETERS) ×1 IMPLANT
DEVICE RAD COMP TR BAND LRG (VASCULAR PRODUCTS) ×1 IMPLANT
GLIDESHEATH SLEND SS 6F .021 (SHEATH) ×1 IMPLANT
GUIDEWIRE INQWIRE 1.5J.035X260 (WIRE) IMPLANT
INQWIRE 1.5J .035X260CM (WIRE) ×2
KIT HEART LEFT (KITS) ×2 IMPLANT
PACK CARDIAC CATHETERIZATION (CUSTOM PROCEDURE TRAY) ×2 IMPLANT
SHEATH PROBE COVER 6X72 (BAG) ×1 IMPLANT
TRANSDUCER W/STOPCOCK (MISCELLANEOUS) ×2 IMPLANT
TUBING CIL FLEX 10 FLL-RA (TUBING) ×2 IMPLANT

## 2018-02-19 NOTE — Progress Notes (Signed)
PROGRESS NOTE    [name]  VOZ:[id number] DOB: 11/18/1945 DOA: 02/17/2018 PCP: Debbrah Alar, NP   Brief Narrative: Patient is a 72 year old male with past medical history of coronary disease, diabetes type 2, CVA with residual left hemiplegia, hyperlipidemia, paroxysmal A. fib on Coumadin, OSA not on CPAP, hypertension, hypothyroidism, status post CABG, diastolic CHF who presents with complaints of chest pain, shortness of breath and fatigue.  He was also complaining of dizziness with standing/ambulating and dyspnea on exertion.  Admitted for further management.  Underwent cardiac cath today.  Cardiology following.  Assessment & Plan:   Principal Problem:   Ischemic chest pain ([place]) Active Problems:   Hyperlipidemia LDL goal <70   Essential hypertension   History of stroke with residual deficit   Type 2 diabetes mellitus without complication, without long-term current use of insulin (HCC)   Long term current use of anticoagulant therapy   Hypothyroidism, acquired   Chronic diastolic heart failure (HCC)   Persistent atrial fibrillation   Dyspnea on exertion  Chest pain: Currently resolved.  History of significant coronary disease.  He is  status post CABG.  Underwent cardiac cath today with findings of severe three-vessel occlusive coronary disease with 3 out of 4 grafts patent and chronic occlusion of the SVG to terminal left circumflex.  Plan is to continue medical management. Cardiology following. Troponins were flat.  EKG had some nonspecific T wave inversions in the anterolateral leads.  Paroxysmal A. fib: Continue warfarin with heparin.  Continue monitoring daily INR.  Currently in normal sinus rhythm.  Continue Lopressor for rate control.  Dizziness: Orthostatic vitals negative. Continue meclizine .If he continues to be dizzy ,we will order MRI of the brain to R/O stroke.  Chronic diastolic CHF: Last 2D echo done on 02/18/2018 showed preserved left ventricular  ejection fraction with grade 1 diastolic dysfunction.  Not volume overloaded.  Coronary disease status post CABG: Continue his home medications which include aspirin, Lipitor, benazepril, Imdur, Lopressor.  History of CVA: With residual left hemiplegia.  Continue aspirin, Lipitor.  Hypothyroidism: Continue levothyroxine  Gout: Continue allopurinol  GERD: Continue Protonix  Diabetes mellitus: Continue sliding his insulin.  Hemoglobin A1c on 11/19 was 6.7.          DVT prophylaxis:Warfarin,heparin Code Status: Full Family Communication: None present at the bedside Disposition Plan: Home tomorrow   Consultants: Cardiology  Procedures: Left heart catheterization  Antimicrobials: None  Subjective: Patient seen and examined the bedside this afternoon.  Remains comfortable.  Denies any chest pain or shortness of breath during my evaluation.Continues to complain of dizziness.  Objective: Vitals:   02/19/18 0908 02/19/18 0913 02/19/18 0915 02/19/18 1129  BP: (!) 144/84 (!) 156/90 (!) 173/97 135/74  Pulse: (!) 53 (!) 57 (!) 58 (!) 58  Resp: 14 18  14   Temp:    98 F (36.7 C)  TempSrc:    Oral  SpO2: 99% 100% 100% 95%  Weight:      Height:        Intake/Output Summary (Last 24 hours) at 02/19/2018 1437 Last data filed at 02/19/2018 0530 Gross per 24 hour  Intake 551.03 ml  Output -  Net 551.03 ml   Filed Weights   02/17/18 0954 02/17/18 1530  Weight: 113.4 kg 114.7 kg    Examination:  General exam: Appears calm and comfortable ,Not in distress,obese HEENT:PERRL,Oral mucosa moist, Ear/Nose normal on gross exam Respiratory system: Bilateral equal air entry, normal vesicular breath sounds, no wheezes or crackles  Cardiovascular  system: S1 & S2 heard, RRR. No JVD, murmurs, rubs, gallops or clicks. No pedal edema. Gastrointestinal system: Abdomen is nondistended, soft and nontender. No organomegaly or masses felt. Normal bowel sounds heard. Central nervous system:  Alert and oriented. No focal neurological deficits. Extremities: No edema, no clubbing ,no cyanosis, distal peripheral pulses palpable. Skin: No rashes, lesions or ulcers,no icterus ,no pallor MSK: Normal muscle bulk,tone ,power Psychiatry: Judgement and insight appear normal. Mood & affect appropriate.     Data Reviewed: I have personally reviewed following labs and imaging studies  CBC: Recent Labs  Lab 02/17/18 0956 02/19/18 0257  WBC 6.3 6.8  HGB 13.5 12.4*  HCT 43.2 40.0  MCV 92.7 92.2  PLT 172 144   Basic Metabolic Panel: Recent Labs  Lab 02/17/18 0956 02/18/18 0236 02/19/18 0257  NA 140 141 141  K 4.1 3.9 3.9  CL 101 100 101  CO2 27 32 32  GLUCOSE 138* 114* 110*  BUN 16 18 21   CREATININE 1.22 1.14 1.27*  CALCIUM 9.3 9.2 9.1   GFR: Estimated Creatinine Clearance: 69.7 mL/min (A) (by C-G formula based on SCr of 1.27 mg/dL (H)). Liver Function Tests: No results for input(s): AST, ALT, ALKPHOS, BILITOT, PROT, ALBUMIN in the last 168 hours. No results for input(s): LIPASE, AMYLASE in the last 168 hours. No results for input(s): AMMONIA in the last 168 hours. Coagulation Profile: Recent Labs  Lab 02/17/18 0956 02/18/18 0236 02/19/18 0257  INR 2.01 2.21 1.97   Cardiac Enzymes: Recent Labs  Lab 02/17/18 1529 02/17/18 2021 02/18/18 0236  TROPONINI 0.04* 0.04* 0.03*   BNP (last 3 results) No results for input(s): PROBNP in the last 8760 hours. HbA1C: No results for input(s): HGBA1C in the last 72 hours. CBG: Recent Labs  Lab 02/18/18 1132 02/18/18 1647 02/18/18 2122 02/19/18 0744 02/19/18 1146  GLUCAP 84 89 117* 86 88   Lipid Profile: Recent Labs    02/18/18 0236  CHOL 117  HDL 34*  LDLCALC 57  TRIG 130  CHOLHDL 3.4   Thyroid Function Tests: No results for input(s): TSH, T4TOTAL, FREET4, T3FREE, THYROIDAB in the last 72 hours. Anemia Panel: No results for input(s): VITAMINB12, FOLATE, FERRITIN, TIBC, IRON, RETICCTPCT in the last 72  hours. Sepsis Labs: No results for input(s): PROCALCITON, LATICACIDVEN in the last 168 hours.  Recent Results (from the past 240 hour(s))  MRSA PCR Screening     Status: None   Collection Time: 02/17/18  4:36 PM  Result Value Ref Range Status   MRSA by PCR NEGATIVE NEGATIVE Final    Comment:        The GeneXpert MRSA Assay (FDA approved for NASAL specimens only), is one component of a comprehensive MRSA colonization surveillance program. It is not intended to diagnose MRSA infection nor to guide or monitor treatment for MRSA infections. Performed at [facility], [place] [address] [place], [place] [address]          Radiology Studies: No results found.      Scheduled Meds: . allopurinol  200 mg Oral Daily  . aspirin EC  81 mg Oral Daily  . atorvastatin  80 mg Oral q1800  . insulin aspart  0-15 Units Subcutaneous TID WC  . iopamidol      . isosorbide mononitrate  15 mg Oral Daily  . levothyroxine  112 mcg Oral QAC breakfast  . metoprolol tartrate  12.5 mg Oral BID  . pantoprazole  40 mg Oral QHS  . sodium chloride  flush  3 mL Intravenous Q12H  . sodium chloride flush  3 mL Intravenous Q12H  . traMADol  50 mg Oral BID  . warfarin  3 mg Oral ONCE-1800  . Warfarin - Pharmacist Dosing Inpatient   Does not apply q1800   Continuous Infusions: . sodium chloride    . sodium chloride    . sodium chloride 1 mL/kg/hr (02/19/18 1134)  . heparin       LOS: 1 day    Time spent: 35 mins.More than 50% of that time was spent in counseling and/or coordination of care.      Shelly Coss, MD [facility] Pager [phone]  If 7PM-7AM, please contact night-coverage [email or web address] Password [facility] 02/19/2018, 2:37 PM

## 2018-02-19 NOTE — Evaluation (Signed)
Physical Therapy Evaluation Patient Details Name: [name] MRN: [id number] DOB: [birth year] Today's Date: 02/19/2018   History of Present Illness  Pt adm with chest pain. Pt also c/o dizziness. Pt underwent cardiac cath 12/6 and is to continue medical treatment.PMH - rt CVA with residual lt weakness, htn, cad, cabg, chf  Clinical Impression  Pt presents to PT with complaints of not feeling "right" and being "dizzy". Pt reports he was sick a week ago and stayed in the bed most of the time since then. He also reports that he feels like he has had a general decline over the past several months. Pt rambles when talking about the past week and when he felt "dizzy". I asked if he felt light headed, dizzy, or if things were spinning and he said light headed. Orthostatics were negative and when amb he reported he only felt a little dizzy. Pt also reports he thought initially his coffee maker made him sick because his daughter had read something on the internet about how coffee makers were making people sick. With mobility I feel pt is mobilizing adequately to return home with wife. Recommend he use a rolling walker initially if he feels unsteady. He request a rolling walker stating the one he has is borrowed from his daughter. He also does not feel follow up PT is needed.     Follow Up Recommendations No PT follow up    Equipment Recommendations  Rolling walker with 5" wheels    Recommendations for Other Services       Precautions / Restrictions Restrictions Weight Bearing Restrictions: No      Mobility  Bed Mobility Overal bed mobility: Needs Assistance Bed Mobility: Supine to Sit     Supine to sit: Min assist     General bed mobility comments: Assist to elevate trunk into sitting due to pt not using LUE due to radial cath earlier in the day  Transfers Overall transfer level: Needs assistance Equipment used: None Transfers: Sit to/from Stand Sit to Stand: Min guard          General transfer comment: Assist for safety  Ambulation/Gait Ambulation/Gait assistance: Min guard Gait Distance (Feet): 200 Feet Assistive device: None Gait Pattern/deviations: Step-through pattern;Decreased dorsiflexion - left;Steppage Gait velocity: decr Gait velocity interpretation: <1.31 ft/sec, indicative of household ambulator General Gait Details: assist for lines/safety  Stairs            Wheelchair Mobility    Modified Rankin (Stroke Patients Only)       Balance Overall balance assessment: Needs assistance Sitting-balance support: No upper extremity supported;Feet supported Sitting balance-Leahy Scale: Good     Standing balance support: No upper extremity supported;During functional activity Standing balance-Leahy Scale: Good                               Pertinent Vitals/Pain Pain Assessment: No/denies pain    Home Living Family/patient expects to be discharged to:: Private residence Living Arrangements: Spouse/significant other Available Help at Discharge: Family Type of Home: House Home Access: Stairs to enter Entrance Stairs-Rails: Left Entrance Stairs-Number of Steps: 3-4 Home Layout: One level Home Equipment: Cane - single point;Shower seat      Prior Function Level of Independence: Independent         Comments: drives, works in the yard     [employer]   Dominant Hand: Left    Extremity/Trunk Assessment   Upper Extremity Assessment Upper Extremity  Assessment: LUE deficits/detail LUE Deficits / Details: chronic weakness due to old CVA    Lower Extremity Assessment Lower Extremity Assessment: LLE deficits/detail LLE Deficits / Details: chronic weakness due to old CVA       Communication      Cognition Arousal/Alertness: Awake/alert Behavior During Therapy: WFL for tasks assessed/performed Overall Cognitive Status: Impaired/Different from baseline Area of Impairment: Attention                    Current Attention Level: Alternating           General Comments: Pt goes into long rambling description of what has been going on with him. Difficult to bring him to the immediate task      General Comments      Exercises     Assessment/Plan    PT Assessment Patient needs continued PT services  PT Problem List Decreased strength;Decreased balance;Decreased mobility       PT Treatment Interventions DME instruction;Gait training;Functional mobility training;Therapeutic activities;Therapeutic exercise;Balance training;Patient/family education    PT Goals (Current goals can be found in the Care Plan section)  Acute Rehab PT Goals Patient Stated Goal: Get back to baseline PT Goal Formulation: With patient Time For Goal Achievement: 02/26/18 Potential to Achieve Goals: Good    Frequency Min 3X/week   Barriers to discharge        Co-evaluation               AM-PAC PT "6 Clicks" Mobility  Outcome Measure Help needed turning from your back to your side while in a flat bed without using bedrails?: None Help needed moving from lying on your back to sitting on the side of a flat bed without using bedrails?: A Little Help needed moving to and from a bed to a chair (including a wheelchair)?: A Little Help needed standing up from a chair using your arms (e.g., wheelchair or bedside chair)?: A Little Help needed to walk in hospital room?: A Little Help needed climbing 3-5 steps with a railing? : A Little 6 Click Score: 19    End of Session Equipment Utilized During Treatment: Gait belt Activity Tolerance: Patient tolerated treatment well Patient left: in chair;with call bell/phone within reach;with family/visitor present Nurse Communication: Mobility status PT Visit Diagnosis: Unsteadiness on feet (R26.81);Other abnormalities of gait and mobility (R26.89)    Time: 3244-0102 PT Time Calculation (min) (ACUTE ONLY): 30 min   Charges:   PT Evaluation $PT Eval  Moderate Complexity: 1 Mod PT Treatments $Gait Training: 8-22 mins        [place] Pager [phone] Office [place] 02/19/2018, 5:33 PM

## 2018-02-19 NOTE — Progress Notes (Signed)
ANTICOAGULATION CONSULT NOTE   Pharmacy Consult for heparin, Coumadin Indication: chest pain/ACS, afib hx CVA  No Known Allergies  Patient Measurements: Height: 6\' 3"  (190.5 cm) Weight: 252 lb 13.9 oz (114.7 kg) IBW/kg (Calculated) : 84.5 Heparin Dosing Weight: 108kg  Vital Signs: Temp: 98 F (36.7 C) (12/06 1129) Temp Source: Oral (12/06 1129) BP: 135/74 (12/06 1129) Pulse Rate: 58 (12/06 1129)  Labs: Recent Labs    02/17/18 0956 02/17/18 1529 02/17/18 2021 02/18/18 0236 02/19/18 0257  HGB 13.5  --   --   --  12.4*  HCT 43.2  --   --   --  40.0  PLT 172  --   --   --  155  LABPROT 22.5*  --   --  24.2* 22.2*  INR 2.01  --   --  2.21 1.97  CREATININE 1.22  --   --  1.14 1.27*  TROPONINI  --  0.04* 0.04* 0.03*  --     Estimated Creatinine Clearance: 69.7 mL/min (A) (by C-G formula based on SCr of 1.27 mg/dL (H)).   Medical History: Past Medical History:  Diagnosis Date  . Acute encephalopathy 12/12/2015  . Acute respiratory failure with hypoxia and hypercapnia ([place]) 12/12/2015  . Allergic rhinitis 02/13/2010   Qualifier: Diagnosis of  By: Wynona Luna   . Angina at rest [facility]) 10/22/2015  . Ataxia 08/10/2013  . B12 deficiency 08/11/2013  . BACK PAIN, LUMBAR 01/29/2009   Qualifier: Diagnosis of  By: Wynona Luna   . BENIGN POSITIONAL VERTIGO 11/23/2009   Qualifier: Diagnosis of  By: Wynona Luna   . Chronic diastolic heart failure ([place]) 04/24/2015  . Chronic pain    left sided-Kristeins  . Coronary 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 ([place]) 11/04/2015   s/p CABG 2004; CFX DES 2011  . Essential hypertension 12/12/2006   Qualifier: Diagnosis of  By: [name] RMA, Lucy    . GERD 12/12/2006   Qualifier: Diagnosis of  By: [name] RMA, Lucy    . GOUT 12/12/2006   Qualifier: Diagnosis of  By: Reatha Armour, Lucy    . Hyperlipidemia LDL goal <70 12/12/2006   Qualifier: Diagnosis of  By: [name] RMA, Lucy     . Hypogonadism male 04/10/2011  .  Hypothyroidism, acquired 02/15/2013  . Insomnia 05/21/2013  . Long term current use of anticoagulant therapy 06/24/2011  . Obstructive sleep apnea-Failed CPAP 06/17/2007   Failed CPAP Using O 2 for sleep   . Paroxysmal atrial fibrillation ([place]) 12/06/2016  . 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 Thoracic aorta atherosclerosis ([place]) 04/27/2017  . Tubular adenoma of colon 05/2010  . Type 2 diabetes mellitus without complication, without long-term current use of insulin ([place]) 10/05/2007   Qualifier: Diagnosis of  By: Wynona Luna   . Vasovagal syncope     Assessment: 72 yo male with CP to start heparin when INR < 2.0. He is on coumadin PTA for afib/CVA. Holding warfarin and heparin until INR down.   AM INR now down to 1.97.  CBC stable.  No overt bleeding or complications noted.  Now s/p cath, sheath out at 915 AM.  Pharmacy asked to resume Heparin and Coumadin 8 hrs after sheath removed.  PTA Coumadin dose 2.5mg /day except 1.25mg  Su, Tu, Th.  Goal of Therapy:  Heparin level 0.3-0.7 units/ml Monitor platelets by anticoagulation protocol: Yes   Plan:  -Start  IV heparin at 1500 units/hr at 1715 PM -Check heparin level 8 hrs after gtt resumed -Daily heparin level and CBC -Coumadin 3 mg x 1 tonight -Daily INR.   Marguerite Olea, [facility] Clinical Pharmacist Phone [phone]  02/19/2018 12:16 PM

## 2018-02-19 NOTE — Interval H&P Note (Signed)
History and Physical Interval Note:  02/19/2018 8:39 AM  [name]  has presented today for surgery, with the diagnosis of ua  The various methods of treatment have been discussed with the patient and family. After consideration of risks, benefits and other options for treatment, the patient has consented to  Procedure(s): LEFT HEART CATH AND CORS/GRAFTS ANGIOGRAPHY (N/A) as a surgical intervention .  The patient's history has been reviewed, patient examined, no change in status, stable for surgery.  I have reviewed the patient's chart and labs.  Questions were answered to the patient's satisfaction.   Cath Lab Visit (complete for each Cath Lab visit)  Clinical Evaluation Leading to the Procedure:   ACS: Yes.    Non-ACS:    Anginal Classification: CCS III  Anti-ischemic medical therapy: Maximal Therapy (2 or more classes of medications)  Non-Invasive Test Results: No non-invasive testing performed  Prior CABG: Previous CABG        [name] [facility] 02/19/2018 8:39 AM

## 2018-02-19 NOTE — Progress Notes (Signed)
[place] for heparin when INR < 2 Indication: chest pain/ACS  No Known Allergies  Patient Measurements: Height: 6\' 3"  (190.5 cm) Weight: 252 lb 13.9 oz (114.7 kg) IBW/kg (Calculated) : 84.5 Heparin Dosing Weight: 108kg  Vital Signs: Temp: 97.8 F (36.6 C) (12/06 0306) Temp Source: Oral (12/06 0306) BP: 148/74 (12/06 0306) Pulse Rate: 51 (12/06 0306)  Labs: Recent Labs    02/17/18 0956 02/17/18 1529 02/17/18 2021 02/18/18 0236 02/19/18 0257  HGB 13.5  --   --   --  12.4*  HCT 43.2  --   --   --  40.0  PLT 172  --   --   --  155  LABPROT 22.5*  --   --  24.2* 22.2*  INR 2.01  --   --  2.21 1.97  CREATININE 1.22  --   --  1.14 1.27*  TROPONINI  --  0.04* 0.04* 0.03*  --     Estimated Creatinine Clearance: 69.7 mL/min (A) (by C-G formula based on SCr of 1.27 mg/dL (H)).   Medical History: Past Medical History:  Diagnosis Date  . Acute encephalopathy 12/12/2015  . Acute respiratory failure with hypoxia and hypercapnia ([place]) 12/12/2015  . Allergic rhinitis 02/13/2010   Qualifier: Diagnosis of  By: Wynona Luna   . Angina at rest [facility]) 10/22/2015  . Ataxia 08/10/2013  . B12 deficiency 08/11/2013  . BACK PAIN, LUMBAR 01/29/2009   Qualifier: Diagnosis of  By: Wynona Luna   . BENIGN POSITIONAL VERTIGO 11/23/2009   Qualifier: Diagnosis of  By: Wynona Luna   . Chronic diastolic heart failure ([place]) 04/24/2015  . Chronic pain    left sided-Kristeins  . Coronary 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 ([place]) 11/04/2015   s/p CABG 2004; CFX DES 2011  . Essential hypertension 12/12/2006   Qualifier: Diagnosis of  By: [name] RMA, Lucy    . GERD 12/12/2006   Qualifier: Diagnosis of  By: [name] RMA, Lucy    . GOUT 12/12/2006   Qualifier: Diagnosis of  By: Reatha Armour, Lucy    . Hyperlipidemia LDL goal <70 12/12/2006   Qualifier: Diagnosis of  By: [name] RMA, Lucy     . Hypogonadism male 04/10/2011  . Hypothyroidism,  acquired 02/15/2013  . Insomnia 05/21/2013  . Long term current use of anticoagulant therapy 06/24/2011  . Obstructive sleep apnea-Failed CPAP 06/17/2007   Failed CPAP Using O 2 for sleep   . Paroxysmal atrial fibrillation ([place]) 12/06/2016  . Stroke (cerebrum) ([place]) 01/13/2017   also 1996; resultant hemiplegia of dominant side  . Thoracic aorta atherosclerosis ([place]) 04/27/2017  . Tubular adenoma of colon 05/2010  . Type 2 diabetes mellitus without complication, without long-term current use of insulin ([place]) 10/05/2007   Qualifier: Diagnosis of  By: Wynona Luna   . Vasovagal syncope     Assessment: 72 yo male with CP to start heparin when INR < 2.0. He is on coumadin PTA for afib/CVA. Holding warfarin and heparin until INR down.   AM INR now down to 1.97.  CBC stable.  No overt bleeding or complications noted.  Goal of Therapy:  Heparin level 0.3-0.7 units/ml Monitor platelets by anticoagulation protocol: Yes   Plan:  -Start IV heparin at 1500 units/hr -Check heparin level in 8 hrs -Daily heparin level and CBC -F/u plans for cath soon?   [place] Crane, Vena Austria, BCPS, [facility] Clinical Pharmacist Phone (  [phone]  02/19/2018 7:27 AM

## 2018-02-19 NOTE — Progress Notes (Addendum)
Progress Note  Patient Name: [name] Date of Encounter: 02/19/2018  Primary Cardiologist: Nelva Bush, MD   Subjective   Patient denies any chest pain. He continues to be very worried about his dizziness and shortness of breath. Patient reports dizziness with standing/ambulating and dyspnea on exertion over the last couple of days. However, he denies any dizziness or shortness of breath with ambulating to the restroom right before I saw the patient. He denies any orthopnea or PND but is on supplemental O2 at night which he states his previous "respiratory doctor" recommended because he was unable to tolerate his CPAP machine.  Inpatient Medications    Scheduled Meds: . [MAR Hold] allopurinol  200 mg Oral Daily  . [MAR Hold] aspirin EC  81 mg Oral Daily  . [MAR Hold] atorvastatin  80 mg Oral q1800  . [MAR Hold] insulin aspart  0-15 Units Subcutaneous TID WC  . [MAR Hold] isosorbide mononitrate  15 mg Oral Daily  . [MAR Hold] levothyroxine  112 mcg Oral QAC breakfast  . [MAR Hold] metoprolol tartrate  12.5 mg Oral BID  . [MAR Hold] pantoprazole  40 mg Oral QHS  . [MAR Hold] sodium chloride flush  3 mL Intravenous Q12H  . sodium chloride flush  3 mL Intravenous Q12H  . [MAR Hold] traMADol  50 mg Oral BID   Continuous Infusions: . [MAR Hold] sodium chloride    . sodium chloride    . sodium chloride     PRN Meds: [MAR Hold] sodium chloride, sodium chloride, [MAR Hold] acetaminophen, [MAR Hold] magnesium hydroxide, [MAR Hold]  morphine injection, [MAR Hold] nitroGLYCERIN, [MAR Hold] ondansetron (ZOFRAN) IV, [MAR Hold] sodium chloride flush, sodium chloride flush   Vital Signs    Vitals:   02/19/18 0903 02/19/18 0908 02/19/18 0913 02/19/18 0915  BP: (!) 156/88 (!) 144/84 (!) 156/90 (!) 173/97  Pulse: (!) 56 (!) 53 (!) 57 (!) 58  Resp: 13 14 18    Temp:      TempSrc:      SpO2: 96% 99% 100% 100%  Weight:      Height:        Intake/Output Summary (Last 24 hours)  at 02/19/2018 0946 Last data filed at 02/19/2018 0530 Gross per 24 hour  Intake 1031.03 ml  Output -  Net 1031.03 ml   Filed Weights   02/17/18 0954 02/17/18 1530  Weight: 113.4 kg 114.7 kg    Telemetry    Sinus rhythm - Personally Reviewed  Physical Exam   GEN: No acute distress.   Neck: Supple. Cardiac: RRR. No murmurs, rubs, or gallops.  Respiratory: Clear to auscultation bilaterally. No wheezes, rhonchi, or rales. GI: Abdomen soft, non-tender, non-distended. MS: No lower extremity edema. Neuro:  No focal deficits. Psych: Normal affect.  Labs    Chemistry Recent Labs  Lab 02/17/18 0956 02/18/18 0236 02/19/18 0257  NA 140 141 141  K 4.1 3.9 3.9  CL 101 100 101  CO2 27 32 32  GLUCOSE 138* 114* 110*  BUN 16 18 21   CREATININE 1.22 1.14 1.27*  CALCIUM 9.3 9.2 9.1  GFRNONAA 58* >60 55*  GFRAA >60 >60 >60  ANIONGAP 12 9 8      Hematology Recent Labs  Lab 02/17/18 0956 02/19/18 0257  WBC 6.3 6.8  RBC 4.66 4.34  HGB 13.5 12.4*  HCT 43.2 40.0  MCV 92.7 92.2  MCH 29.0 28.6  MCHC 31.3 31.0  RDW 13.3 13.3  PLT 172 155  Cardiac Enzymes Recent Labs  Lab 02/17/18 1529 02/17/18 2021 02/18/18 0236  TROPONINI 0.04* 0.04* 0.03*    Recent Labs  Lab 02/17/18 1017 02/17/18 1421  TROPIPOC 0.06 0.04     BNPNo results for input(s): BNP, PROBNP in the last 168 hours.   DDimer No results for input(s): DDIMER in the last 168 hours.   Radiology    Dg Chest 2 View  Result Date: 02/17/2018 CLINICAL DATA:  Seven day history of arrhythmia in this patient with prior cardioversion for atrial fibrillation, associated with shortness of breath upon exertion. Prior CABG. Personal history of stroke with LEFT-sided deficits. EXAM: CHEST - 2 VIEW COMPARISON:  04/27/2017, 01/13/2017 and earlier, including high-resolution CT chest 11/04/2016. FINDINGS: Prior sternotomy for CABG. Cardiac silhouette upper normal in size, unchanged. Thoracic aorta mildly tortuous and  atherosclerotic, unchanged. Hilar and mediastinal contours otherwise unremarkable. Chronic elevation of the RIGHT hemidiaphragm with chronic scar/atelectasis in the RIGHT MIDDLE LOBE and RIGHT LOWER LOBE, unchanged. Lungs otherwise clear. Pulmonary vascularity normal. No pleural effusions. Degenerative changes and DISH involving the thoracic spine. IMPRESSION: 1.  No acute cardiopulmonary disease. 2. Stable chronic elevation of the RIGHT hemidiaphragm and chronic scar/atelectasis in the RIGHT MIDDLE LOBE and RIGHT LOWER LOBE. Electronically Signed   By: Evangeline Dakin M.D.   On: 02/17/2018 10:41    Cardiac Studies   Echocardiogram 02/18/2018: Study Conclusions: - Left ventricle: The cavity size was normal. There was mild focal   basal hypertrophy of the septum. Systolic function was normal.   The estimated ejection fraction was in the range of 55% to 60%.   Wall motion was normal; there were no regional wall motion   abnormalities. Doppler parameters are consistent with abnormal   left ventricular relaxation (grade 1 diastolic dysfunction).   Doppler parameters are consistent with elevated ventricular   end-diastolic filling pressure. - Mitral valve: Calcified annulus. Mildly thickened leaflets .   There was mild regurgitation. - Left atrium: The atrium was mildly dilated. - Right ventricle: The cavity size was mildly dilated. Wall   thickness was normal. Systolic function was mildly reduced. - Right atrium: The atrium was normal in size. - Tricuspid valve: There was mild regurgitation. - Pulmonary arteries: Systolic pressure was within the normal   range. - Inferior vena cava: The vessel was normal in size. - Pericardium, extracardiac: There was no pericardial effusion. _______________  Left Heart Catheterization 02/18/2018:  Ost RCA to Prox RCA lesion is 100% stenosed.  Ost LAD to Prox LAD lesion is 95% stenosed.  Ost 1st Diag lesion is 100% stenosed.  Dist LAD lesion is 95%  stenosed.  Ost Cx to Prox Cx lesion is 100% stenosed.  Prox Cx to Mid Cx lesion is 100% stenosed.  LIMA graft was visualized by angiography and is normal in caliber.  The graft exhibits no disease.  SVG graft was visualized by angiography and is normal in caliber.  The graft exhibits no disease.  SVG graft was visualized by angiography and is normal in caliber.  The graft exhibits no disease.  Origin to Prox Graft lesion is 100% stenosed.  LV end diastolic pressure is normal.   1. Left dominant circulation 2. Severe 3 vessel occlusive CAD.    - 95% ostial LAD    - 100% first diagonal    - 100% ostial LCx. This is new since 2017 - was 95% prior    - 100% nondominant RCA 3. Patent LIMA to the LAD. There is chronic severe disease in  the apical LAD 4. Patent SVG to the first diagonal and SVG to the first OM as a Y graft. 5. Chronic occlusion of the SVG to terminal LCx 6. Normal LVEDP  Plan: Continue medical management. Will resume IV heparin until coumadin is again therapeutic given history of CVA.   Patient Profile     Mr. Wessells is a 72 year old male with a history of CAD s/p CABG in 0623, chronic diastolic heart failure, paroxsymal atrial fibrillation on Coumadin, stroke with residual left sided weakness, hypertension, hyperlipidemia, and obstructive sleep apnea who was admitted on 02/17/2018 after presenting with new onset dyspnea on exertion concerning for anginal equivalent.   Assessment & Plan    New Onset Dyspnea on Exertion - EKG showed sinus rhythm with non-specific T wave inversion in anterolateral leads. - Troponin minimally elevated and flat (0.04 >> 0.04 >> 0.03). - Echo yesterday showed LVEF of 55-60% with no regional wall motion abnormalities and grade 1 diastolic dysfunction. - Left heart cath today showed severe 3 vessel occlusive CAD with 3 out of 4 grafts patent and chronic occlusion of the SVG to terminal LCx. - Continue medical management with Aspirin,  statin, beta-blocker, and Imdur. - Will restart Heparin until Coumadin is therapeutic (>2) again. INR 1.97 today. Patient will likely be able to go home tomorrow.   Paroxysmal Atrial Fibrillation  - Patient currently in sinus rhythm on telemetry. - Continue Lopressor 12.5mg  twice daily.  - Continue Heparin bridge to Coumadin. Discussed possibly switching to DOAC but patient is unsure at this time.   Hypertension - BP has been elevated this morning around catheterization. Most recent systolic BP in the 762G.  - Continue Lopressor and Imdur. - Consider restarting home benazepril.   Chronic Diastolic Heart Failure - Echo showed LVEF of 55-60% with grade 1 diastolic dysfunction. - Patient does not appear volume overloaded on exam. - Currently takes Lasix every other day at home.   Dizziness - Patient concerned about recent dizziness after standing over the last few days. Patient's son thinks he is dehydrated - patient drinks a lot of soda and tea but not very much water. - Will check orthostatics.   For questions or updates, please contact [place] Please consult [email or web address] for contact info under        Signed, Darreld Mclean, PA-C  02/19/2018, 9:46 AM     Agree with note by Sande Rives, PA-C  Results of diagnostic cath performed by Dr. [place] via left radial approach this morning revealed unchanged anatomy compared to his previous cath.  The LIMA to the LAD was patent.  The Y graft to a diagonal branch and obtuse marginal branch was patent as well.  I am not sure the etiology of his shortness of breath.  Will restart heparin and oral anticoagulation in anticipation of discharge tomorrow.  Lorretta Harp, M.D., [place], [facility], [place] [address] [place], [place]  [address]  [phone] 02/19/2018 11:30 AM

## 2018-02-20 DIAGNOSIS — R42 Dizziness and giddiness: Secondary | ICD-10-CM

## 2018-02-20 DIAGNOSIS — I6930 Unspecified sequelae of cerebral infarction: Secondary | ICD-10-CM

## 2018-02-20 LAB — CBC
HCT: 38.7 % — ABNORMAL LOW (ref 39.0–52.0)
Hemoglobin: 11.9 g/dL — ABNORMAL LOW (ref 13.0–17.0)
MCH: 28.5 pg (ref 26.0–34.0)
MCHC: 30.7 g/dL (ref 30.0–36.0)
MCV: 92.6 fL (ref 80.0–100.0)
Platelets: 144 10*3/uL — ABNORMAL LOW (ref 150–400)
RBC: 4.18 MIL/uL — ABNORMAL LOW (ref 4.22–5.81)
RDW: 13.3 % (ref 11.5–15.5)
WBC: 6.4 10*3/uL (ref 4.0–10.5)
nRBC: 0 % (ref 0.0–0.2)

## 2018-02-20 LAB — GLUCOSE, CAPILLARY
GLUCOSE-CAPILLARY: 112 mg/dL — AB (ref 70–99)
Glucose-Capillary: 84 mg/dL (ref 70–99)

## 2018-02-20 LAB — PROTIME-INR
INR: 1.62
Prothrombin Time: 19.1 seconds — ABNORMAL HIGH (ref 11.4–15.2)

## 2018-02-20 LAB — BASIC METABOLIC PANEL
Anion gap: 7 (ref 5–15)
BUN: 17 mg/dL (ref 8–23)
CHLORIDE: 104 mmol/L (ref 98–111)
CO2: 29 mmol/L (ref 22–32)
Calcium: 8.6 mg/dL — ABNORMAL LOW (ref 8.9–10.3)
Creatinine, Ser: 1.3 mg/dL — ABNORMAL HIGH (ref 0.61–1.24)
GFR calc Af Amer: >60 mL/min (ref >60)
GFR calc non Af Amer: 54 mL/min — ABNORMAL LOW (ref >60)
Glucose, Bld: 115 mg/dL — ABNORMAL HIGH (ref 70–99)
Potassium: 3.9 mmol/L (ref 3.5–5.1)
SODIUM: 140 mmol/L (ref 135–145)

## 2018-02-20 LAB — HEPARIN LEVEL (UNFRACTIONATED): Heparin Unfractionated: <0.1 IU/mL — ABNORMAL LOW (ref 0.30–0.70)

## 2018-02-20 MED ORDER — WARFARIN SODIUM 3 MG PO TABS: 3.0000 mg | ORAL_TABLET | Freq: Once | ORAL | Status: DC

## 2018-02-20 MED ORDER — MECLIZINE HCL 25 MG PO TABS: 25.0000 mg | ORAL_TABLET | Freq: Three times a day (TID) | ORAL | 0 refills | Status: DC | PRN

## 2018-02-20 NOTE — Progress Notes (Signed)
ANTICOAGULATION CONSULT NOTE   Pharmacy Consult for Heparin  Indication: chest pain/ACS, afib hx CVA  No Known Allergies  Patient Measurements: Height: 6\' 3"  (190.5 cm) Weight: 252 lb 13.9 oz (114.7 kg) IBW/kg (Calculated) : 84.5 Heparin Dosing Weight: 108kg  Vital Signs: Temp: 97.8 F (36.6 C) (12/07 0350) Temp Source: Oral (12/07 0350) BP: 144/75 (12/07 0350) Pulse Rate: 58 (12/07 0350)  Labs: Recent Labs    02/17/18 0956 02/17/18 1529 02/17/18 2021 02/18/18 0236 02/19/18 0257 02/20/18 0207  HGB 13.5  --   --   --  12.4* 11.9*  HCT 43.2  --   --   --  40.0 38.7*  PLT 172  --   --   --  155 144*  LABPROT 22.5*  --   --  24.2* 22.2* 19.1*  INR 2.01  --   --  2.21 1.97 1.62  HEPARINUNFRC  --   --   --   --   --  <0.10*  CREATININE 1.22  --   --  1.14 1.27* 1.30*  TROPONINI  --  0.04* 0.04* 0.03*  --   --     Estimated Creatinine Clearance: 68.1 mL/min (A) (by C-G formula based on SCr of 1.3 mg/dL (H)).   Medical History: Past Medical History:  Diagnosis Date  . Acute encephalopathy 12/12/2015  . Acute respiratory failure with hypoxia and hypercapnia ([place]) 12/12/2015  . Allergic rhinitis 02/13/2010   Qualifier: Diagnosis of  By: Wynona Luna   . Angina at rest [facility]) 10/22/2015  . Ataxia 08/10/2013  . B12 deficiency 08/11/2013  . BACK PAIN, LUMBAR 01/29/2009   Qualifier: Diagnosis of  By: Wynona Luna   . BENIGN POSITIONAL VERTIGO 11/23/2009   Qualifier: Diagnosis of  By: Wynona Luna   . Chronic diastolic heart failure ([place]) 04/24/2015  . Chronic pain    left sided-[place]  . Coronary 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 ([place]) 11/04/2015   s/p CABG 2004; CFX DES 2011  . Essential hypertension 12/12/2006   Qualifier: Diagnosis of  By: [name] RMA, Lucy    . GERD 12/12/2006   Qualifier: Diagnosis of  By: [name] RMA, Lucy    . GOUT 12/12/2006   Qualifier: Diagnosis of  By: Reatha Armour, Lucy    . Hyperlipidemia LDL goal <70 12/12/2006    Qualifier: Diagnosis of  By: [name] RMA, Lucy     . Hypogonadism male 04/10/2011  . Hypothyroidism, acquired 02/15/2013  . Insomnia 05/21/2013  . Long term current use of anticoagulant therapy 06/24/2011  . Obstructive sleep apnea-Failed CPAP 06/17/2007   Failed CPAP Using O 2 for sleep   . Paroxysmal atrial fibrillation ([place]) 12/06/2016  . 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 Thoracic aorta atherosclerosis ([place]) 04/27/2017  . Tubular adenoma of colon 05/2010  . Type 2 diabetes mellitus without complication, without long-term current use of insulin ([place]) 10/05/2007   Qualifier: Diagnosis of  By: Wynona Luna   . Vasovagal syncope     Assessment: 72 yo male with CP to start heparin when INR < 2.0. He is on coumadin PTA for afib/CVA. Holding warfarin and heparin until INR down.   AM INR now down to 1.97.  CBC stable.  No overt bleeding or complications noted.  Now s/p cath, sheath out at 915 AM.  Pharmacy asked to resume Heparin and Coumadin 8 hrs after sheath removed.  PTA Coumadin dose 2.5mg /day except 1.25mg  Su, Tu, Th.  12/7 AM update: heparin level low, infusing ok per RN, no other issues, INR down to 1.62 this AM  Goal of Therapy:  Heparin level 0.3-0.7 units/ml Monitor platelets by anticoagulation protocol: Yes   Plan:  -Inc heparin to 1800 units/hr -Re-check heparin level in 6-8 hours  Narda Bonds, PharmD, [place] Pharmacist Phone: [phone]

## 2018-02-20 NOTE — Care Management (Signed)
RW ordered from [facility] to be delivered to room prior to dc.

## 2018-02-20 NOTE — Progress Notes (Signed)
Removed PIV access x 2 and Pt received discharge instructions. Pt wanted to have walker at home and it was delivered. Pt's son took his all belongings. HS [employer]

## 2018-02-20 NOTE — Progress Notes (Signed)
Progress Note  Patient Name: [name] Date of Encounter: 02/20/2018  Primary Cardiologist: Nelva Bush, MD   Subjective   Feeling well.  No chest pain.  Still has mild dizziness.  Inpatient Medications    Scheduled Meds: . allopurinol  200 mg Oral Daily  . aspirin EC  81 mg Oral Daily  . atorvastatin  80 mg Oral q1800  . insulin aspart  0-15 Units Subcutaneous TID WC  . isosorbide mononitrate  15 mg Oral Daily  . levothyroxine  112 mcg Oral QAC breakfast  . metoprolol tartrate  12.5 mg Oral BID  . pantoprazole  40 mg Oral QHS  . sodium chloride flush  3 mL Intravenous Q12H  . sodium chloride flush  3 mL Intravenous Q12H  . traMADol  50 mg Oral BID  . warfarin  3 mg Oral ONCE-1800  . Warfarin - Pharmacist Dosing Inpatient   Does not apply q1800   Continuous Infusions: . sodium chloride    . sodium chloride    . heparin 1,800 Units/hr (02/20/18 0443)   PRN Meds: sodium chloride, sodium chloride, acetaminophen, magnesium hydroxide, meclizine, morphine injection, nitroGLYCERIN, ondansetron (ZOFRAN) IV, sodium chloride flush, sodium chloride flush   Vital Signs    Vitals:   02/19/18 1914 02/20/18 0005 02/20/18 0350 02/20/18 0752  BP: (!) 149/70 (!) 144/72 (!) 144/75 (!) 156/84  Pulse: 62 (!) 56 (!) 58 (!) 56  Resp: (!) 22 18 16 13   Temp: 98.5 F (36.9 C) 98.1 F (36.7 C) 97.8 F (36.6 C) 97.8 F (36.6 C)  TempSrc: Oral Oral Oral Oral  SpO2: 98% 100% 100% 100%  Weight:      Height:        Intake/Output Summary (Last 24 hours) at 02/20/2018 0914 Last data filed at 02/20/2018 0753 Gross per 24 hour  Intake 236.85 ml  Output 0 ml  Net 236.85 ml   Filed Weights   02/17/18 0954 02/17/18 1530  Weight: 113.4 kg 114.7 kg    Telemetry    Sinus rhythm, sinus bradycardia.  Rate 40s-60s.  PVC, PACs - Personally Reviewed  ECG    n/a - Personally Reviewed  Physical Exam   VS:  BP (!) 156/84 (BP Location: Left Arm)   Pulse 60   Temp 97.8 F (36.6  C) (Oral)   Resp 13   Ht 6\' 3"  (1.905 m)   Wt 114.7 kg   SpO2 100%   BMI 31.61 kg/m  , BMI Body mass index is 31.61 kg/m. GENERAL:  Well appearing HEENT: Pupils equal round and reactive, fundi not visualized, oral mucosa unremarkable NECK:  No jugular venous distention, waveform within normal limits, carotid upstroke brisk and symmetric, no bruits LUNGS:  Clear to auscultation bilaterally HEART:  RRR.  PMI not displaced or sustained,S1 and S2 within normal limits, no S3, no S4, no clicks, no rubs, no murmurs ABD:  Flat, positive bowel sounds normal in frequency in pitch, no bruits, no rebound, no guarding, no midline pulsatile mass, no hepatomegaly, no splenomegaly EXT:  2 plus pulses throughout, no edema, no cyanosis no clubbing SKIN:  No rashes no nodules NEURO:  Cranial nerves II through XII grossly intact, motor grossly intact throughout PSYCH:  Cognitively intact, oriented to person place and time  Labs    Chemistry Recent Labs  Lab 02/18/18 0236 02/19/18 0257 02/20/18 0207  NA 141 141 140  K 3.9 3.9 3.9  CL 100 101 104  CO2 32 32 29  GLUCOSE 114*  110* 115*  BUN 18 21 17   CREATININE 1.14 1.27* 1.30*  CALCIUM 9.2 9.1 8.6*  GFRNONAA >60 55* 54*  GFRAA >60 >60 >60  ANIONGAP 9 8 7      Hematology Recent Labs  Lab 02/17/18 0956 02/19/18 0257 02/20/18 0207  WBC 6.3 6.8 6.4  RBC 4.66 4.34 4.18*  HGB 13.5 12.4* 11.9*  HCT 43.2 40.0 38.7*  MCV 92.7 92.2 92.6  MCH 29.0 28.6 28.5  MCHC 31.3 31.0 30.7  RDW 13.3 13.3 13.3  PLT 172 155 144*    Cardiac Enzymes Recent Labs  Lab 02/17/18 1529 02/17/18 2021 02/18/18 0236  TROPONINI 0.04* 0.04* 0.03*    Recent Labs  Lab 02/17/18 1017 02/17/18 1421  TROPIPOC 0.06 0.04     BNPNo results for input(s): BNP, PROBNP in the last 168 hours.   DDimer No results for input(s): DDIMER in the last 168 hours.   Radiology    Ct Angio Chest Pe W Or Wo Contrast  Result Date: 02/19/2018 CLINICAL DATA:  72 year old male  with a history of chest pain EXAM: CT ANGIOGRAPHY CHEST WITH CONTRAST TECHNIQUE: Multidetector CT imaging of the chest was performed using the standard protocol during bolus administration of intravenous contrast. Multiplanar CT image reconstructions and MIPs were obtained to evaluate the vascular anatomy. CONTRAST:  144mL ISOVUE-370 IOPAMIDOL (ISOVUE-370) INJECTION 76% COMPARISON:  11/04/2016 FINDINGS: Cardiovascular: Heart: No cardiomegaly. No pericardial fluid/thickening. Dense calcifications of left main, left anterior descending, circumflex, right coronary arteries. Surgical changes of median sternotomy. Aorta: Unremarkable course, caliber, contour of the thoracic aorta. No aneurysm or dissection flap. No periaortic fluid. Pulmonary arteries: No central, lobar, segmental, or proximal subsegmental filling defects. Mediastinum/Nodes: No mediastinal adenopathy. Unremarkable appearance of the thoracic esophagus. Unremarkable appearance of the thoracic inlet and thyroid. Lungs/Pleura: Central airways are clear. No pleural effusion. No confluent airspace disease. Atelectasis at the lung bases. No pneumothorax. Similar appearance of right hemidiaphragm elevation with associated scarring/atelectasis. Upper Abdomen: No acute finding of the upper abdomen Musculoskeletal: No acute displaced fracture. Degenerative changes of the spine. Review of the MIP images confirms the above findings. IMPRESSION: Negative for pulmonary emboli. Left main and 3 vessel coronary artery disease. Electronically Signed   By: Corrie Mckusick D.O.   On: 02/19/2018 15:08    Cardiac Studies   Echo 02/18/18: Study Conclusions  - Left ventricle: The cavity size was normal. There was mild focal   basal hypertrophy of the septum. Systolic function was normal.   The estimated ejection fraction was in the range of 55% to 60%.   Wall motion was normal; there were no regional wall motion   abnormalities. Doppler parameters are consistent with  abnormal   left ventricular relaxation (grade 1 diastolic dysfunction).   Doppler parameters are consistent with elevated ventricular   end-diastolic filling pressure. - Mitral valve: Calcified annulus. Mildly thickened leaflets .   There was mild regurgitation. - Left atrium: The atrium was mildly dilated. - Right ventricle: The cavity size was mildly dilated. Wall   thickness was normal. Systolic function was mildly reduced. - Right atrium: The atrium was normal in size. - Tricuspid valve: There was mild regurgitation. - Pulmonary arteries: Systolic pressure was within the normal   range. - Inferior vena cava: The vessel was normal in size. - Pericardium, extracardiac: There was no pericardial effusion.  LHC 02/19/18:   Ost RCA to Prox RCA lesion is 100% stenosed.  Ost LAD to Prox LAD lesion is 95% stenosed.  Ost 1st Diag lesion  is 100% stenosed.  Dist LAD lesion is 95% stenosed.  Ost Cx to Prox Cx lesion is 100% stenosed.  Prox Cx to Mid Cx lesion is 100% stenosed.  LIMA graft was visualized by angiography and is normal in caliber.  The graft exhibits no disease.  SVG graft was visualized by angiography and is normal in caliber.  The graft exhibits no disease.  SVG graft was visualized by angiography and is normal in caliber.  The graft exhibits no disease.  Origin to Prox Graft lesion is 100% stenosed.  LV end diastolic pressure is normal.   1. Left dominant circulation 2. Severe 3 vessel occlusive CAD.    - 95% ostial LAD    - 100% first diagonal    - 100% ostial LCx. This is new since 2017 - was 95% prior    - 100% nondominant RCA 3. Patent LIMA to the LAD. There is chronic severe disease in the apical LAD 4. Patent SVG to the first diagonal and SVG to the first OM as a Y graft. 5. Chronic occlusion of the SVG to terminal LCx 6. Normal LVEDP  Patient Profile     72 y.o. male with CAD s/p CABG (2014), chronic diastolic heart failure, PAF, CVA with  residual L sided weakness, hypertension, hyperlipidemia, and OSA with exertional dyspnea.   Assessment & Plan    # CAD s/p CABG:  Coronary anatomy unchanged on cath this admission.  He has severe 3 vessel CAD and the SVT to LCX is occluded.  However the LIMA to LAD, SVG to D, and SVG to OM are all patent.  There is collateral flow from the OM to distal LCX.  Metoprolol was started this admission.  We will stop 2/2 bradycardia.  Continue atorvastatin.  He isn't on aspirin given that he is on warfarin.  Continue Imdur 15mg  daily.  He notes that he was accidentally taking 30mg  at home, which may be contributing to his dizziness.   # Exertional dyspnea: Etiology unclear.  Coronary anatomy unchanged.  Chest CT was negative for PE this admission.  Echo with only grade 1 diastolic dysfunction.   # PAF:  Warfarin and heparin restarted post cath.  INR was 1.62.  He will need an INR check next week.   # Hypertension: BP controlled on benazepril.  Stop metoprolol which was added this admission.   # Chronic diastolic heart failure: Euvolmeic on exam.  I doubt this is contributing to his dyspnea.   # Dizziness: Bradycardia noted this hospitalization. HR 48 when awake.  He was started on metoprolol this admission, which we will stop.  He noted his HR at home is in the 60-70s. He was mildly orthostatic but improved with IV fluid.  BP dropped 137 to 113.  He was also accidentally taking 30mg  of Imdur instead of 15mg .  Continue to monitor as an outpatient.   [facility] will sign off.   Medication Recommendations:  Stop metoprolol  Other recommendations (labs, testing, etc):  none Follow up as an outpatient:  We will arrange within 2 weeks.   For questions or updates, please contact [place] Please consult [email or web address] for contact info under        Signed, Skeet Latch, MD  02/20/2018, 9:14 AM

## 2018-02-20 NOTE — Discharge Summary (Signed)
Physician Discharge Summary  [name] EXN:[id number] DOB: 22-Nov-1945 DOA: 02/17/2018  PCP: Debbrah Alar, NP  Admit date: 02/17/2018 Discharge date: 02/20/2018  Admitted From: Home Disposition:  Home  Discharge Condition:Stable CODE STATUS:FULL Diet recommendation: Heart Healthy  Brief/Interim Summary:  Patient is a 72 year old male with past medical history of coronary disease, diabetes type 2, CVA with residual left hemiplegia, hyperlipidemia, paroxysmal A. fib on Coumadin, OSA not on CPAP, hypertension, hypothyroidism, status post CABG, diastolic CHF who presents with complaints of chest pain, shortness of breath and fatigue.  He was also complaining of dizziness with standing/ambulating and dyspnea on exertion.  Admitted for further management.  Underwent cardiac cath here.  Cardiology was following.  He was also seen by physical therapist who did not recommend any follow-up.  His overall status is stable today.  He is hemodynamically stable. He is stable for discharge to home today.  He will follow-up with cardiology as an outpatient.  Following problems were addressed during his hospitalization:  Chest pain: Currently resolved.  History of significant coronary disease.  He is  status post CABG.  Underwent cardiac cath today with findings of severe three-vessel occlusive coronary disease with 3 out of 4 grafts patent and chronic occlusion of the SVG to terminal left circumflex.  Plan is to continue medical management. Cardiology was following. Troponins were flat.  EKG had some nonspecific T wave inversions in the anterolateral leads.  Paroxysmal A. fib: Continue warfarin at home dose.Currently in normal sinus rhythm.    Lopressure discontinued due to bradycardia.  Dizziness: Orthostatic vitals negative. Improved.  Chronic diastolic CHF: Last 2D echo done on 02/18/2018 showed preserved left ventricular ejection fraction with grade 1 diastolic dysfunction.  Not volume  overloaded.  Coronary disease status post CABG: Continue his home medications which include aspirin, Lipitor, benazepril, Imdur, =  History of CVA: With residual left hemiplegia.  Continue aspirin, Lipitor.  Hypothyroidism: Continue levothyroxine  Gout: Continue allopurinol  GERD: Continue Protonix  Diabetes mellitus: Continue sliding his insulin.  Hemoglobin A1c on 11/19 was 6.7.     Discharge Diagnoses:  Principal Problem:   Ischemic chest pain ([facility]) Active Problems:   Hyperlipidemia LDL goal <70   Essential hypertension   History of stroke with residual deficit   Type 2 diabetes mellitus without complication, without long-term current use of insulin ([place])   Long term current use of anticoagulant therapy   Hypothyroidism, acquired   Chronic diastolic heart failure (HCC)   Persistent atrial fibrillation   Dyspnea on exertion    Discharge Instructions  Discharge Instructions    Diet - low sodium heart healthy   Complete by:  As directed    Discharge instructions   Complete by:  As directed    1) Follow up with cardiology in 2 weeks. 2)Continue your home medications. 3)Check your INR regularly.   Increase activity slowly   Complete by:  As directed      Allergies as of 02/20/2018   No Known Allergies     Medication List    TAKE these medications   allopurinol 100 MG tablet Commonly known as:  ZYLOPRIM TAKE 2 TABLETS EVERY DAY   atorvastatin 40 MG tablet Commonly known as:  LIPITOR Take 1 tablet (40 mg total) by mouth daily.   benazepril 10 MG tablet Commonly known as:  LOTENSIN Take 1 tablet (10 mg total) by mouth daily. What changed:  additional instructions   benazepril 5 MG tablet Commonly known as:  LOTENSIN TAKE 1 TABLET  BY MOUTH DAILY WITH 10MG  OF BENAZEPRIL TO EQUAL 15MG  DAILY What changed:  See the new instructions.   cyanocobalamin 1000 MCG tablet Take 1 tablet (1,000 mcg total) by mouth daily. What changed:  how much to take    fluticasone 50 MCG/ACT nasal spray Commonly known as:  FLONASE Place 2 sprays into both nostrils daily. What changed:    when to take this  reasons to take this   furosemide 40 MG tablet Commonly known as:  LASIX TAKE 1 TABLET BY MOUTH EVERY OTHER DAY   isosorbide mononitrate 30 MG 24 hr tablet Commonly known as:  IMDUR Take 0.5 tablets (15 mg total) by mouth daily.   levothyroxine 112 MCG tablet Commonly known as:  SYNTHROID, LEVOTHROID TAKE 1 TABLET (112 MCG TOTAL) BY MOUTH DAILY BEFORE BREAKFAST.   metFORMIN 500 MG tablet Commonly known as:  GLUCOPHAGE TAKE 1 TABLET (500 MG TOTAL) DAILY WITH BREAKFAST BY MOUTH.   MULTIVITAMIN GUMMIES MENS PO Take 1 tablet by mouth daily with breakfast.   nitroGLYCERIN 0.4 MG SL tablet Commonly known as:  NITROSTAT Place 1 tablet (0.4 mg total) under the tongue every 5 (five) minutes as needed for chest pain. Up to 3 doses   OXYGEN Inhale 2 L into the lungs at bedtime.   pantoprazole 40 MG tablet Commonly known as:  PROTONIX TAKE 1 TABLET (40 MG TOTAL) EVERY MORNING BY MOUTH.   traMADol 50 MG tablet Commonly known as:  ULTRAM Take 1 tablet (50 mg total) by mouth 2 (two) times daily.   traMADol 200 MG 24 hr tablet Commonly known as:  ULTRAM-ER TAKE 1 TABLET EVERY DAY   warfarin 2.5 MG tablet Commonly known as:  COUMADIN Take as directed. If you are unsure how to take this medication, talk to your nurse or doctor. Original instructions:  Take as directed by coumadin clinic What changed:    how much to take  how to take this  when to take this  additional instructions      Follow-up Information    Jerline Pain, MD Follow up.   Specialty:  Cardiology Why:  office will contact you Contact information: [address] [place] [address] [phone]          No Known Allergies  Consultations:  Cardiology   Procedures/Studies: Dg Chest 2 View  Result Date: 02/17/2018 CLINICAL  DATA:  Seven day history of arrhythmia in this patient with prior cardioversion for atrial fibrillation, associated with shortness of breath upon exertion. Prior CABG. Personal history of stroke with LEFT-sided deficits. EXAM: CHEST - 2 VIEW COMPARISON:  04/27/2017, 01/13/2017 and earlier, including high-resolution CT chest 11/04/2016. FINDINGS: Prior sternotomy for CABG. Cardiac silhouette upper normal in size, unchanged. Thoracic aorta mildly tortuous and atherosclerotic, unchanged. Hilar and mediastinal contours otherwise unremarkable. Chronic elevation of the RIGHT hemidiaphragm with chronic scar/atelectasis in the RIGHT MIDDLE LOBE and RIGHT LOWER LOBE, unchanged. Lungs otherwise clear. Pulmonary vascularity normal. No pleural effusions. Degenerative changes and DISH involving the thoracic spine. IMPRESSION: 1.  No acute cardiopulmonary disease. 2. Stable chronic elevation of the RIGHT hemidiaphragm and chronic scar/atelectasis in the RIGHT MIDDLE LOBE and RIGHT LOWER LOBE. Electronically Signed   By: Evangeline Dakin M.D.   On: 02/17/2018 10:41   Ct Angio Chest Pe W Or Wo Contrast  Result Date: 02/19/2018 CLINICAL DATA:  72 year old male with a history of chest pain EXAM: CT ANGIOGRAPHY CHEST WITH CONTRAST TECHNIQUE: Multidetector CT imaging of the chest was performed  using the standard protocol during bolus administration of intravenous contrast. Multiplanar CT image reconstructions and MIPs were obtained to evaluate the vascular anatomy. CONTRAST:  185mL ISOVUE-370 IOPAMIDOL (ISOVUE-370) INJECTION 76% COMPARISON:  11/04/2016 FINDINGS: Cardiovascular: Heart: No cardiomegaly. No pericardial fluid/thickening. Dense calcifications of left main, left anterior descending, circumflex, right coronary arteries. Surgical changes of median sternotomy. Aorta: Unremarkable course, caliber, contour of the thoracic aorta. No aneurysm or dissection flap. No periaortic fluid. Pulmonary arteries: No central, lobar,  segmental, or proximal subsegmental filling defects. Mediastinum/Nodes: No mediastinal adenopathy. Unremarkable appearance of the thoracic esophagus. Unremarkable appearance of the thoracic inlet and thyroid. Lungs/Pleura: Central airways are clear. No pleural effusion. No confluent airspace disease. Atelectasis at the lung bases. No pneumothorax. Similar appearance of right hemidiaphragm elevation with associated scarring/atelectasis. Upper Abdomen: No acute finding of the upper abdomen Musculoskeletal: No acute displaced fracture. Degenerative changes of the spine. Review of the MIP images confirms the above findings. IMPRESSION: Negative for pulmonary emboli. Left main and 3 vessel coronary artery disease. Electronically Signed   By: Corrie Mckusick D.O.   On: 02/19/2018 15:08       Subjective: Patient seen and examined the bedside this morning.  His orthostatic vitals were negative when he checked last time.  He is hemodynamically stable.  Denies any chest pain today.  Does not look dyspneic.  Still complains of some dizziness when he gets up but it has improved.  He is  stable for discharge today.  Discharge Exam: Vitals:   02/20/18 0752 02/20/18 0919  BP: (!) 156/84   Pulse: (!) 56 60  Resp: 13   Temp: 97.8 F (36.6 C)   SpO2: 100%    Vitals:   02/20/18 0005 02/20/18 0350 02/20/18 0752 02/20/18 0919  BP: (!) 144/72 (!) 144/75 (!) 156/84   Pulse: (!) 56 (!) 58 (!) 56 60  Resp: 18 16 13    Temp: 98.1 F (36.7 C) 97.8 F (36.6 C) 97.8 F (36.6 C)   TempSrc: Oral Oral Oral   SpO2: 100% 100% 100%   Weight:      Height:        General: Pt is alert, awake, not in acute distress Cardiovascular: RRR, S1/S2 +, no rubs, no gallops Respiratory: CTA bilaterally, no wheezing, no rhonchi Abdominal: Soft, NT, ND, bowel sounds + Extremities: no edema, no cyanosis    The results of significant diagnostics from this hospitalization (including imaging, microbiology, ancillary and laboratory)  are listed below for reference.     Microbiology: Recent Results (from the past 240 hour(s))  MRSA PCR Screening     Status: None   Collection Time: 02/17/18  4:36 PM  Result Value Ref Range Status   MRSA by PCR NEGATIVE NEGATIVE Final    Comment:        The GeneXpert MRSA Assay (FDA approved for NASAL specimens only), is one component of a comprehensive MRSA colonization surveillance program. It is not intended to diagnose MRSA infection nor to guide or monitor treatment for MRSA infections. Performed at [facility], [place] [address] [place], [place] [address]      Labs: BNP (last 3 results) No results for input(s): BNP in the last 8760 hours. Basic Metabolic Panel: Recent Labs  Lab 02/17/18 0956 02/18/18 0236 02/19/18 0257 02/20/18 0207  NA 140 141 141 140  K 4.1 3.9 3.9 3.9  CL 101 100 101 104  CO2 27 32 32 29  GLUCOSE 138* 114* 110* 115*  BUN 16 18  21 17  CREATININE 1.22 1.14 1.27* 1.30*  CALCIUM 9.3 9.2 9.1 8.6*   Liver Function Tests: No results for input(s): AST, ALT, ALKPHOS, BILITOT, PROT, ALBUMIN in the last 168 hours. No results for input(s): LIPASE, AMYLASE in the last 168 hours. No results for input(s): AMMONIA in the last 168 hours. CBC: Recent Labs  Lab 02/17/18 0956 02/19/18 0257 02/20/18 0207  WBC 6.3 6.8 6.4  HGB 13.5 12.4* 11.9*  HCT 43.2 40.0 38.7*  MCV 92.7 92.2 92.6  PLT 172 155 144*   Cardiac Enzymes: Recent Labs  Lab 02/17/18 1529 02/17/18 2021 02/18/18 0236  TROPONINI 0.04* 0.04* 0.03*   BNP: Invalid input(s): POCBNP CBG: Recent Labs  Lab 02/19/18 0744 02/19/18 1146 02/19/18 1641 02/19/18 2152 02/20/18 0750  GLUCAP 86 88 113* 138* 84   D-Dimer No results for input(s): DDIMER in the last 72 hours. Hgb A1c No results for input(s): HGBA1C in the last 72 hours. Lipid Profile Recent Labs    02/18/18 0236  CHOL 117  HDL 34*  LDLCALC 57  TRIG 130  CHOLHDL 3.4   Thyroid function studies No results for  input(s): TSH, T4TOTAL, T3FREE, THYROIDAB in the last 72 hours.  Invalid input(s): FREET3 Anemia work up No results for input(s): VITAMINB12, FOLATE, FERRITIN, TIBC, IRON, RETICCTPCT in the last 72 hours. Urinalysis    Component Value Date/Time   COLORURINE YELLOW 04/06/2017 0838   APPEARANCEUR CLEAR 04/06/2017 0838   LABSPEC 1.023 04/06/2017 0838   PHURINE 5.0 04/06/2017 0838   GLUCOSEU NEGATIVE 04/06/2017 0838   HGBUR NEGATIVE 04/06/2017 0838   BILIRUBINUR neg 04/27/2017 1258   KETONESUR NEGATIVE 04/06/2017 0838   PROTEINUR neg 04/27/2017 1258   PROTEINUR NEGATIVE 04/06/2017 0838   UROBILINOGEN 0.2 04/27/2017 1258   UROBILINOGEN 1.0 11/25/2014 1806   NITRITE neg 04/27/2017 1258   NITRITE NEGATIVE 04/06/2017 0838   LEUKOCYTESUR Negative 04/27/2017 1258   Sepsis Labs Invalid input(s): PROCALCITONIN,  WBC,  LACTICIDVEN Microbiology Recent Results (from the past 240 hour(s))  MRSA PCR Screening     Status: None   Collection Time: 02/17/18  4:36 PM  Result Value Ref Range Status   MRSA by PCR NEGATIVE NEGATIVE Final    Comment:        The GeneXpert MRSA Assay (FDA approved for NASAL specimens only), is one component of a comprehensive MRSA colonization surveillance program. It is not intended to diagnose MRSA infection nor to guide or monitor treatment for MRSA infections. Performed at [facility], [place] [address], [place], [place] [address]     Please note: You were cared for by a hospitalist during your hospital stay. Once you are discharged, your primary care physician will handle any further medical issues. Please note that NO REFILLS for any discharge medications will be authorized once you are discharged, as it is imperative that you return to your primary care physician (or establish a relationship with a primary care physician if you do not have one) for your post hospital discharge needs so that they can reassess your need for medications and monitor your  lab values.    Time coordinating discharge: 40 minutes  SIGNED:   Shelly Coss, MD  [facility] 02/20/2018, 11:18 AM Pager [phone]  If 7PM-7AM, please contact night-coverage [email or web address] Password [email or web address]

## 2018-02-23 ENCOUNTER — Other Ambulatory Visit: Payer: Self-pay | Admitting: Family

## 2018-02-25 NOTE — Telephone Encounter (Signed)
Melissa -- please see refill protocol failure and advise? 

## 2018-03-04 ENCOUNTER — Encounter: Payer: Medicare Other | Attending: Physical Medicine & Rehabilitation

## 2018-03-04 ENCOUNTER — Ambulatory Visit (INDEPENDENT_AMBULATORY_CARE_PROVIDER_SITE_OTHER): Payer: Medicare Other | Admitting: Pharmacist

## 2018-03-04 ENCOUNTER — Encounter: Payer: Self-pay | Admitting: Physical Medicine & Rehabilitation

## 2018-03-04 ENCOUNTER — Ambulatory Visit (HOSPITAL_BASED_OUTPATIENT_CLINIC_OR_DEPARTMENT_OTHER): Payer: Medicare Other | Admitting: Physical Medicine & Rehabilitation

## 2018-03-04 VITALS — BP 150/82 | HR 76 | Ht 75.0 in | Wt 255.0 lb

## 2018-03-04 DIAGNOSIS — I6523 Occlusion and stenosis of bilateral carotid arteries: Secondary | ICD-10-CM | POA: Diagnosis not present

## 2018-03-04 DIAGNOSIS — I4891 Unspecified atrial fibrillation: Secondary | ICD-10-CM

## 2018-03-04 DIAGNOSIS — G8110 Spastic hemiplegia affecting unspecified side: Secondary | ICD-10-CM

## 2018-03-04 DIAGNOSIS — Z7901 Long term (current) use of anticoagulants: Secondary | ICD-10-CM

## 2018-03-04 DIAGNOSIS — G8111 Spastic hemiplegia affecting right dominant side: Secondary | ICD-10-CM | POA: Diagnosis not present

## 2018-03-04 LAB — POCT INR: INR: 2 (ref 2.0–3.0)

## 2018-03-04 NOTE — Progress Notes (Signed)
Subjective:    Patient ID: [name], male    DOB: [birth year], 72 y.o.   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 He remains independent with all his self-care and mobility. His functional usage of the left upper extremity has not really improved with Botox but we discussed that we do not expect any improvements with motor control Botox 6wks ago, Left knee is easier to extend  Left lower ext hamstrings 175 units  Left upper ext  FDS 50 units,  FDP 25 units, FCR 50 units  Pain Inventory Average Pain 6 Pain Right Now 6 My pain is tingling  In the last 24 hours, has pain interfered with the following? General activity . Relation with others . Enjoyment of life . What TIME of day is your pain at its worst? na Sleep (in general) Fair  Pain is worse with: standing Pain improves with: medication Relief from Meds: 8  Mobility use a walker ability to climb steps?  yes do you drive?  yes  Function retired  Neuro/Psych No problems in this area  Prior Studies Any changes since last visit?  no  Physicians involved in your care Any changes since last visit?  no   Family History  Problem Relation Age of Onset  . Lung cancer Father        deceased  . Stroke Mother        deceased-MINISTROKES   Social History   Socioeconomic History  . Marital status: Married    Spouse name: [name]  . Number of children: Not on file  . Years of education: 57  . Highest education level: Not on file  Occupational History  . 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 Financial resource strain: Not on file  . Food insecurity:    Worry: Not on file    Inability: Not on file  . Transportation needs:    Medical: Not on file    Non-medical: Not on file  Tobacco Use  . Smoking status: Never Smoker  . Smokeless tobacco: Never  Used  Substance and Sexual Activity  . Alcohol use: No    Alcohol/week: 0.0 standard drinks    Comment: 1 beer a month  . Drug use: No  . Sexual activity: Not Currently  Lifestyle  . Physical activity:    Days per week: Not on file    Minutes per session: Not on file  . Stress: Not on file  Relationships  . Social connections:    Talks on phone: Not on file    Gets together: Not on file    Attends religious service: Not on file    Active member of club or organization: Not on file    Attends meetings of clubs or organizations: Not on file    Relationship status: Not on file  Other Topics Concern  . Not on file  Social History Narrative   Pt lives with wife. Does have stairs, but patient doesn't use them. Pt has completed technical school   Past Surgical History:  Procedure Laterality Date  . CARDIAC CATHETERIZATION N/A 10/25/2015   Procedure: Left Heart Cath and Cors/Grafts Angiography;  Surgeon: Troy Sine, MD;  Location: [facility];  Service: Cardiovascular;  Laterality: N/A;  . CARDIOVERSION  06/20/2011   Procedure: CARDIOVERSION;  Surgeon: Larey Dresser, MD;  Location: [facility];  Service: Cardiovascular;  Laterality: N/A;  . CARDIOVERSION N/A 04/26/2015   Procedure: CARDIOVERSION;  Surgeon: Dorothy Spark, MD;  Location[facility] ENDOSCOPY;  Service: Cardiovascular;  Laterality: N/A;  . CARDIOVERSION N/A 05/10/2015   Procedure: CARDIOVERSION;  Surgeon: Larey Dresser, MD;  Location: [facility];  Service: Cardiovascular;  Laterality: N/A;  . CATARACT EXTRACTION  05/2010   left eye  . CATARACT EXTRACTION  04/2010   right eye  . CORONARY ARTERY BYPASS GRAFT     stent  . ESOPHAGOGASTRODUODENOSCOPY  03-25-2005  . LEFT HEART CATH AND CORS/GRAFTS ANGIOGRAPHY N/A 02/19/2018   Procedure: LEFT HEART CATH AND CORS/GRAFTS ANGIOGRAPHY;  Surgeon: Martinique, Peter M, MD;  Location: [facility];  Service: Cardiovascular;  Laterality: N/A;  . NASAL SEPTUM SURGERY    .  PERCUTANEOUS PLACEMENT INTRAVASCULAR STENT CERVICAL CAROTID ARTERY     03-2009; using a drug-eluting platform of the circumflex cornoray artery with a 3.0 x 18 [employer] Promus drug-eluting platform post dilated to 3.75 with a noncompliant balloon.  . TEE WITHOUT CARDIOVERSION  06/20/2011   Procedure: TRANSESOPHAGEAL ECHOCARDIOGRAM (TEE);  Surgeon: Larey Dresser, MD;  Location: [facility];  Service: Cardiovascular;  Laterality: N/A;   Past Medical History:  Diagnosis Date  . Acute encephalopathy 12/12/2015  . Acute respiratory failure with hypoxia and hypercapnia ([place]) 12/12/2015  . Allergic rhinitis 02/13/2010   Qualifier: Diagnosis of  By: Wynona Luna   . Angina at rest [facility]) 10/22/2015  . Ataxia 08/10/2013  . B12 deficiency 08/11/2013  . BACK PAIN, LUMBAR 01/29/2009   Qualifier: Diagnosis of  By: Wynona Luna   . BENIGN POSITIONAL VERTIGO 11/23/2009   Qualifier: Diagnosis of  By: Wynona Luna   . Chronic diastolic heart failure ([place]) 04/24/2015  . Chronic pain    left sided-[place]  . Coronary 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 ([place]) 11/04/2015   s/p CABG 2004; CFX DES 2011  . Essential hypertension 12/12/2006   Qualifier: Diagnosis of  By: [name] RMA, Lucy    . GERD 12/12/2006   Qualifier: Diagnosis of  By: [name] RMA, Lucy    . GOUT 12/12/2006   Qualifier: Diagnosis of  By: Reatha Armour, Lucy    . Hyperlipidemia LDL goal <70 12/12/2006   Qualifier: Diagnosis of  By: [name] RMA, Lucy     . Hypogonadism male 04/10/2011  . Hypothyroidism, acquired 02/15/2013  . Insomnia 05/21/2013  . Long term current use of anticoagulant therapy 06/24/2011  . Obstructive sleep apnea-Failed CPAP 06/17/2007   Failed CPAP Using O 2 for sleep   . Paroxysmal atrial fibrillation ([facility]) 12/06/2016  . Stroke (cerebrum) ([place]) 01/13/2017   also 1996; resultant hemiplegia of dominant side  . Thoracic aorta atherosclerosis ([facility]) 04/27/2017  . Tubular adenoma of colon 05/2010  .  Type 2 diabetes mellitus without complication, without long-term current use of insulin ([facility]) 10/05/2007   Qualifier: Diagnosis of  By: Wynona Luna   . Vasovagal syncope    BP (!) 150/82   Pulse 76   Ht 6\' 3"  (1.905 m)   Wt 255 lb (115.7 kg)   SpO2 94%   BMI 31.87 kg/m   Opioid Risk Score:   Fall Risk Score:  `1  Depression screen PHQ 2/9  Depression screen [facility] 2/9 07/28/2017 07/13/2017 05/21/2016 03/05/2015 01/01/2015  Decreased Interest 0 0 0 0 3  Down, Depressed, Hopeless 0 0 0 0 0  PHQ -  2 Score 0 0 0 0 3  Altered sleeping - - 1 - 0  Tired, decreased energy - - 1 - 1  Change in appetite - - 0 - 0  Feeling bad or failure about yourself  - - 0 - 0  Trouble concentrating - - 0 - 0  Moving slowly or fidgety/restless - - 0 - 0  Suicidal thoughts - - 0 - 0  PHQ-9 Score - - 2 - 4  Difficult doing work/chores - - - - Not difficult at all  Some recent data might be hidden     Review of Systems  Constitutional: Negative.   HENT: Negative.   Eyes: Negative.   Respiratory: Negative.   Cardiovascular: Negative.   Gastrointestinal: Negative.   Endocrine: Negative.   Genitourinary: Negative.   Musculoskeletal: Positive for arthralgias, gait problem and myalgias.  Skin: Negative.   Allergic/Immunologic: Negative.   Hematological: Negative.   Psychiatric/Behavioral: Negative.   All other systems reviewed and are negative.      Objective:   Physical Exam Vitals signs reviewed.  Constitutional:      Appearance: Normal appearance.  HENT:     Head: Normocephalic and atraumatic.  Eyes:     Extraocular Movements: Extraocular movements intact.     Pupils: Pupils are equal, round, and reactive to light.  Skin:    General: Skin is warm and dry.  Neurological:     Mental Status: He is alert and oriented to person, place, and time.     Comments: MAS 1 at Left finger and wrist flexors MAS 2 in left hamstring 4- Delt , Bi, tri, grip,  4+ Left HF, KE 3- ADF  Psychiatric:         Mood and Affect: Mood normal.             Assessment & Plan:  1.  Left spastic hemiplegia due to remote CVA, improved tone post botox but pt c/o that finger flexion and extension are still slow , discussed this is a motor control  Issue and would not be helped by Botox, will repeat botox in 6 wks for hamstring 200 U Will hold of on re injection of Left UE finger /wrist flexion, preserved at the 28-month mark will reevaluate after the effects have completely worn off and determine whether further injection would be recommended

## 2018-03-04 NOTE — Patient Instructions (Signed)
Will only do injection on left hamstring muscle next visit

## 2018-03-04 NOTE — Patient Instructions (Signed)
Description   Continue taking 1 tablet everyday except 1/2 tablet on Sundays, Tuesdays and Thursdays. Recheck INR 4 weeks. Main [phone] Coumadin clinic [phone]

## 2018-03-05 ENCOUNTER — Ambulatory Visit: Payer: Medicare Other | Admitting: Cardiology

## 2018-03-05 DIAGNOSIS — R0989 Other specified symptoms and signs involving the circulatory and respiratory systems: Secondary | ICD-10-CM

## 2018-03-08 ENCOUNTER — Encounter: Payer: Self-pay | Admitting: Cardiology

## 2018-03-09 ENCOUNTER — Other Ambulatory Visit: Payer: Self-pay | Admitting: Internal Medicine

## 2018-03-09 NOTE — Telephone Encounter (Signed)
Please review for refill.  

## 2018-04-06 ENCOUNTER — Other Ambulatory Visit: Payer: Self-pay | Admitting: Family

## 2018-04-07 ENCOUNTER — Other Ambulatory Visit: Payer: Self-pay | Admitting: Physical Medicine & Rehabilitation

## 2018-04-07 NOTE — Telephone Encounter (Signed)
Mr [name] called for his refill because [facility] told him he did not have any, but I spoke with them and had has a 90 day supply there that orginated from 11/2017 Rx. He was made aware.

## 2018-04-12 ENCOUNTER — Ambulatory Visit (INDEPENDENT_AMBULATORY_CARE_PROVIDER_SITE_OTHER): Payer: Medicare Other | Admitting: *Deleted

## 2018-04-12 DIAGNOSIS — I4891 Unspecified atrial fibrillation: Secondary | ICD-10-CM

## 2018-04-12 DIAGNOSIS — Z7901 Long term (current) use of anticoagulants: Secondary | ICD-10-CM

## 2018-04-12 LAB — POCT INR: INR: 2.1 (ref 2.0–3.0)

## 2018-04-12 NOTE — Patient Instructions (Signed)
Continue taking 1 tablet everyday except 1/2 tablet on Sundays, Tuesdays and Thursdays. Recheck INR 5 weeks. Main [phone] Coumadin clinic [phone]

## 2018-04-15 ENCOUNTER — Ambulatory Visit: Payer: Medicare Other | Admitting: Physical Medicine & Rehabilitation

## 2018-04-15 ENCOUNTER — Encounter: Payer: Medicare Other | Attending: Physical Medicine & Rehabilitation

## 2018-04-15 DIAGNOSIS — G8111 Spastic hemiplegia affecting right dominant side: Secondary | ICD-10-CM | POA: Insufficient documentation

## 2018-04-24 ENCOUNTER — Other Ambulatory Visit: Payer: Self-pay | Admitting: Internal Medicine

## 2018-04-26 NOTE — Telephone Encounter (Signed)
Please review for refill. Thanks!  

## 2018-04-29 ENCOUNTER — Encounter: Payer: Self-pay | Admitting: Cardiology

## 2018-05-01 ENCOUNTER — Other Ambulatory Visit: Payer: Self-pay | Admitting: Family

## 2018-05-11 ENCOUNTER — Other Ambulatory Visit: Payer: Self-pay | Admitting: Physical Medicine & Rehabilitation

## 2018-05-11 ENCOUNTER — Encounter: Payer: Self-pay | Admitting: Family

## 2018-05-11 ENCOUNTER — Ambulatory Visit (INDEPENDENT_AMBULATORY_CARE_PROVIDER_SITE_OTHER): Payer: Medicare Other | Admitting: Family

## 2018-05-11 VITALS — BP 139/74 | HR 81 | Temp 98.3°F | Resp 16 | Ht 75.0 in | Wt 261.0 lb

## 2018-05-11 DIAGNOSIS — E119 Type 2 diabetes mellitus without complications: Secondary | ICD-10-CM

## 2018-05-11 DIAGNOSIS — K219 Gastro-esophageal reflux disease without esophagitis: Secondary | ICD-10-CM

## 2018-05-11 DIAGNOSIS — E7800 Pure hypercholesterolemia, unspecified: Secondary | ICD-10-CM

## 2018-05-11 DIAGNOSIS — E039 Hypothyroidism, unspecified: Secondary | ICD-10-CM

## 2018-05-11 DIAGNOSIS — I10 Essential (primary) hypertension: Secondary | ICD-10-CM

## 2018-05-11 LAB — BASIC METABOLIC PANEL
BUN: 27 mg/dL — ABNORMAL HIGH (ref 6–23)
CHLORIDE: 102 meq/L (ref 96–112)
CO2: 30 mEq/L (ref 19–32)
CREATININE: 1.12 mg/dL (ref 0.40–1.50)
Calcium: 9.7 mg/dL (ref 8.4–10.5)
GFR: 63.92 mL/min (ref >60.00)
Glucose, Bld: 122 mg/dL — ABNORMAL HIGH (ref 70–99)
Potassium: 4.4 mEq/L (ref 3.5–5.1)
Sodium: 140 mEq/L (ref 135–145)

## 2018-05-11 LAB — HEMOGLOBIN A1C: Hgb A1c MFr Bld: 6.7 % — ABNORMAL HIGH (ref 4.6–6.5)

## 2018-05-11 MED ORDER — FLUOCINONIDE 0.05 % EX GEL: CUTANEOUS | 0 refills | Status: DC

## 2018-05-11 NOTE — Progress Notes (Signed)
Subjective:    Patient ID: [name], male    DOB: 06-12-1945, 73 y.o.   MRN: [id number]  HPI  Patient is a 73 yr old male who presents today for routine follow up.   DM2- maintained on metformin. Reports that he misses 2-3 days a week.  Lab Results  Component Value Date   HGBA1C 6.7 (H) 01/27/2018   HGBA1C 6.9 (H) 10/12/2017   HGBA1C 6.7 (H) 07/13/2017   Lab Results  Component Value Date   MICROALBUR <0.7 09/26/2014   LDLCALC 57 02/18/2018   CREATININE 1.30 (H) 02/20/2018   Hypothyroid- maintained on synthroid.  Lab Results  Component Value Date   TSH 1.47 01/27/2018   Hyperlipidemia- maintained on atorvastatin.  Lab Results  Component Value Date   CHOL 117 02/18/2018   HDL 34 (L) 02/18/2018   LDLCALC 57 02/18/2018   TRIG 130 02/18/2018   CHOLHDL 3.4 02/18/2018   GERD- continues PPI. Reports reflux is stable.     HTN- bp meds inclued lotensin, furosemide. BP Readings from Last 3 Encounters:  05/11/18 139/74  03/04/18 (!) 150/82  02/20/18 136/69     Review of Systems    see HPI  Past Medical History:  Diagnosis Date  . Acute encephalopathy 12/12/2015  . Acute respiratory failure with hypoxia and hypercapnia ([place]) 12/12/2015  . Allergic rhinitis 02/13/2010   Qualifier: Diagnosis of  By: Wynona Luna   . Angina at rest [facility]) 10/22/2015  . Ataxia 08/10/2013  . B12 deficiency 08/11/2013  . BACK PAIN, LUMBAR 01/29/2009   Qualifier: Diagnosis of  By: Wynona Luna   . BENIGN POSITIONAL VERTIGO 11/23/2009   Qualifier: Diagnosis of  By: Wynona Luna   . Chronic diastolic heart failure ([place]) 04/24/2015  . Chronic pain    left sided-Kristeins  . Coronary 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 ([place]) 11/04/2015   s/p CABG 2004; CFX DES 2011  . Essential hypertension 12/12/2006   Qualifier: Diagnosis of  By: [name] RMA, Lucy    . GERD 12/12/2006   Qualifier: Diagnosis of  By: [name] RMA, Lucy    . GOUT 12/12/2006   Qualifier:  Diagnosis of  By: Reatha Armour, Lucy    . Hyperlipidemia LDL goal <70 12/12/2006   Qualifier: Diagnosis of  By: [name] RMA, Lucy     . Hypogonadism male 04/10/2011  . Hypothyroidism, acquired 02/15/2013  . Insomnia 05/21/2013  . Long term current use of anticoagulant therapy 06/24/2011  . Obstructive sleep apnea-Failed CPAP 06/17/2007   Failed CPAP Using O 2 for sleep   . Paroxysmal atrial fibrillation ([place]) 12/06/2016  . 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 Thoracic aorta atherosclerosis ([place]) 04/27/2017  . Tubular adenoma of colon 05/2010  . Type 2 diabetes mellitus without complication, without long-term current use of insulin ([place]) 10/05/2007   Qualifier: Diagnosis of  By: Wynona Luna   . Vasovagal syncope      Social History   Socioeconomic History  . Marital status: Married    Spouse name: [name]  . Number of children: Not on file  . Years of education: 76  . Highest education level: Not on file  Occupational History  . 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 Financial resource strain: Not on file  . Food insecurity:    Worry: Not on file  Inability: Not on file  . Transportation needs:    Medical: Not on file    Non-medical: Not on file  Tobacco Use  . Smoking status: Never Smoker  . Smokeless tobacco: Never Used  Substance and Sexual Activity  . Alcohol use: No    Alcohol/week: 0.0 standard drinks    Comment: 1 beer a month  . Drug use: No  . Sexual activity: Not Currently  Lifestyle  . Physical activity:    Days per week: Not on file    Minutes per session: Not on file  . Stress: Not on file  Relationships  . Social connections:    Talks on phone: Not on file    Gets together: Not on file    Attends religious service: Not on file    Active member of club or organization: Not on file    Attends meetings of clubs or organizations: Not on file    Relationship  status: Not on file  . Intimate partner violence:    Fear of current or ex partner: Not on file    Emotionally abused: Not on file    Physically abused: Not on file    Forced sexual activity: Not on file  Other Topics Concern  . Not on file  Social History Narrative   Pt lives with wife. Does have stairs, but patient doesn't use them. Pt has completed technical school    Past Surgical History:  Procedure Laterality Date  . CARDIAC CATHETERIZATION N/A 10/25/2015   Procedure: Left Heart Cath and Cors/Grafts Angiography;  Surgeon: Troy Sine, MD;  Location: [place] CV LAB;  Service: Cardiovascular;  Laterality: N/A;  . CARDIOVERSION  06/20/2011   Procedure: CARDIOVERSION;  Surgeon: Larey Dresser, MD;  Location: [facility] ENDOSCOPY;  Service: Cardiovascular;  Laterality: N/A;  . CARDIOVERSION N/A 04/26/2015   Procedure: CARDIOVERSION;  Surgeon: Dorothy Spark, MD;  Location: [facility] ENDOSCOPY;  Service: Cardiovascular;  Laterality: N/A;  . CARDIOVERSION N/A 05/10/2015   Procedure: CARDIOVERSION;  Surgeon: Larey Dresser, MD;  Location: [place];  Service: Cardiovascular;  Laterality: N/A;  . CATARACT EXTRACTION  05/2010   left eye  . CATARACT EXTRACTION  04/2010   right eye  . CORONARY ARTERY BYPASS GRAFT     stent  . ESOPHAGOGASTRODUODENOSCOPY  03-25-2005  . LEFT HEART CATH AND CORS/GRAFTS ANGIOGRAPHY N/A 02/19/2018   Procedure: LEFT HEART CATH AND CORS/GRAFTS ANGIOGRAPHY;  Surgeon: Martinique, Peter M, MD;  Location: [place] CV LAB;  Service: Cardiovascular;  Laterality: N/A;  . NASAL SEPTUM SURGERY    . PERCUTANEOUS PLACEMENT INTRAVASCULAR STENT CERVICAL CAROTID ARTERY     03-2009; using a drug-eluting platform of the circumflex cornoray artery with a 3.0 x 18 [employer] Promus drug-eluting platform post dilated to 3.75 with a noncompliant balloon.  . TEE WITHOUT CARDIOVERSION  06/20/2011   Procedure: TRANSESOPHAGEAL ECHOCARDIOGRAM (TEE);  Surgeon: Larey Dresser, MD;  Location: [facility]  ENDOSCOPY;  Service: Cardiovascular;  Laterality: N/A;    Family History  Problem Relation Age of Onset  . Lung cancer Father        deceased  . Stroke Mother        deceased-MINISTROKES    No Known Allergies  Current Outpatient Medications on File Prior to Visit  Medication Sig Dispense Refill  . allopurinol (ZYLOPRIM) 100 MG tablet Take 2 tablets (200 mg total) by mouth daily. 180 tablet 1  . atorvastatin (LIPITOR) 40 MG tablet Take 1 tablet (40 mg total) by  mouth daily. 90 tablet 3  . benazepril (LOTENSIN) 10 MG tablet Take 1 tablet (10 mg total) by mouth daily. (Patient taking differently: Take 10 mg by mouth daily. Take with 5mg  tablet to equal 15mg ) 90 tablet 3  . benazepril (LOTENSIN) 5 MG tablet TAKE 1 TABLET BY MOUTH DAILY WITH 10MG  OF BENAZEPRIL TO EQUAL 15MG  DAILY (Patient taking differently: Take 5 mg by mouth daily. Takes with 10mg  tablet to equal 15mg ) 90 tablet 2  . fluticasone (FLONASE) 50 MCG/ACT nasal spray Place 2 sprays into both nostrils daily. (Patient taking differently: Place 2 sprays into both nostrils daily as needed for allergies or rhinitis. ) 9.9 g 1  . furosemide (LASIX) 40 MG tablet TAKE 1 TABLET BY MOUTH EVERY OTHER DAY 45 tablet 1  . isosorbide mononitrate (IMDUR) 30 MG 24 hr tablet TAKE 0.5 TABLETS (15 MG TOTAL) BY MOUTH DAILY. 45 tablet 0  . levothyroxine (SYNTHROID, LEVOTHROID) 112 MCG tablet TAKE 1 TABLET (112 MCG TOTAL) BY MOUTH DAILY BEFORE BREAKFAST. 90 tablet 1  . meclizine (ANTIVERT) 25 MG tablet Take 1 tablet (25 mg total) by mouth 3 (three) times daily as needed for dizziness. 30 tablet 0  . metFORMIN (GLUCOPHAGE) 500 MG tablet TAKE 1 TABLET (500 MG TOTAL) DAILY WITH BREAKFAST BY MOUTH. 90 tablet 1  . Multiple Vitamins-Minerals (MULTIVITAMIN GUMMIES MENS PO) Take 1 tablet by mouth daily with breakfast.     . nitroGLYCERIN (NITROSTAT) 0.4 MG SL tablet Place 1 tablet (0.4 mg total) under the tongue every 5 (five) minutes as needed for chest pain. Up  to 3 doses 10 tablet 2  . OXYGEN Inhale 2 L into the lungs at bedtime.    . pantoprazole (PROTONIX) 40 MG tablet TAKE 1 TABLET (40 MG TOTAL) EVERY MORNING BY MOUTH. 90 tablet 1  . traMADol (ULTRAM) 50 MG tablet Take 1 tablet (50 mg total) by mouth 2 (two) times daily. 180 tablet 1  . traMADol (ULTRAM-ER) 200 MG 24 hr tablet TAKE 1 TABLET EVERY DAY (Patient taking differently: Take 200 mg by mouth daily. ) 90 tablet 1  . vitamin B-12 1000 MCG tablet Take 1 tablet (1,000 mcg total) by mouth daily. (Patient taking differently: Take 2,000 mcg by mouth daily. ) 30 tablet 0  . warfarin (COUMADIN) 2.5 MG tablet TAKE AS DIRECTED BY COUMADIN CLINIC 90 tablet 0   No current facility-administered medications on file prior to visit.     BP 139/74 (BP Location: Right Arm, Patient Position: Sitting, Cuff Size: Large)   Pulse 81   Temp 98.3 F (36.8 C) (Oral)   Resp 16   Ht 6\' 3"  (1.905 m)   Wt 261 lb (118.4 kg)   BMI 32.62 kg/m    Objective:   Physical Exam Constitutional:      General: He is not in acute distress.    Appearance: He is well-developed.  HENT:     Head: Normocephalic and atraumatic.  Cardiovascular:     Rate and Rhythm: Normal rate and regular rhythm.     Heart sounds: No murmur.  Pulmonary:     Effort: Pulmonary effort is normal. No respiratory distress.     Breath sounds: Normal breath sounds. No wheezing or rales.  Skin:    General: Skin is warm and dry.  Neurological:     Mental Status: He is alert and oriented to person, place, and time.  Psychiatric:        Behavior: Behavior normal.  Thought Content: Thought content normal.           Assessment & Plan:  HTN- bp stable, continue current meds.  DM2- clinically stable on metformin, obtain follow up a1c.   Hyperlipidemia- ldl at goal, continue statin.  Hypothyroid- stable on synthroid, continue current dose.  GERD- stable on PPI, continue same.

## 2018-05-11 NOTE — Patient Instructions (Addendum)
Please schedule follow up with Dr. Leonie Man (neurology) [phone] Complete lab work prior to leaving. Use neutrogena T gel shampoo every other day.  Apply lidex gel once daily to scalp as needed.

## 2018-05-17 ENCOUNTER — Encounter: Payer: Self-pay | Admitting: Cardiology

## 2018-05-17 ENCOUNTER — Ambulatory Visit (INDEPENDENT_AMBULATORY_CARE_PROVIDER_SITE_OTHER): Payer: Medicare Other | Admitting: Cardiology

## 2018-05-17 ENCOUNTER — Ambulatory Visit (INDEPENDENT_AMBULATORY_CARE_PROVIDER_SITE_OTHER): Payer: Medicare Other | Admitting: *Deleted

## 2018-05-17 VITALS — BP 122/60 | HR 69 | Ht 75.0 in | Wt 263.8 lb

## 2018-05-17 DIAGNOSIS — Z7901 Long term (current) use of anticoagulants: Secondary | ICD-10-CM

## 2018-05-17 DIAGNOSIS — I480 Paroxysmal atrial fibrillation: Secondary | ICD-10-CM | POA: Diagnosis not present

## 2018-05-17 DIAGNOSIS — I4891 Unspecified atrial fibrillation: Secondary | ICD-10-CM | POA: Diagnosis not present

## 2018-05-17 DIAGNOSIS — E785 Hyperlipidemia, unspecified: Secondary | ICD-10-CM

## 2018-05-17 DIAGNOSIS — I5032 Chronic diastolic (congestive) heart failure: Secondary | ICD-10-CM

## 2018-05-17 DIAGNOSIS — I6930 Unspecified sequelae of cerebral infarction: Secondary | ICD-10-CM

## 2018-05-17 LAB — POCT INR: INR: 2 (ref 2.0–3.0)

## 2018-05-17 NOTE — Patient Instructions (Signed)
Description   Continue taking 1 tablet everyday except 1/2 tablet on Sundays, Tuesdays and Thursdays. Recheck INR 6 weeks. Main [phone] Coumadin clinic [phone]

## 2018-05-17 NOTE — Patient Instructions (Signed)
Medication Instructions:  The current medical regimen is effective;  continue present plan and medications.  If you need a refill on your cardiac medications before your next appointment, please call your pharmacy.   Follow-Up: At [facility], you and your health needs are our priority.  As part of our continuing mission to provide you with exceptional heart care, we have created designated Provider Care Teams.  These Care Teams include your primary Cardiologist (physician) and Advanced Practice Providers (APPs -  Physician Assistants and Nurse Practitioners) who all work together to provide you with the care you need, when you need it. You will need a follow up appointment in 6 months with Cecilie Kicks, NP and 1 yr with [name].  Please call our office 2 months in advance to schedule this appointment.  You may see Candee Furbish, MD or one of the following Advanced Practice Providers on your designated Care Team:   Truitt Merle, NP Cecilie Kicks, NP . Kathyrn Drown, NP  Thank you for choosing [facility]!!

## 2018-05-17 NOTE — Progress Notes (Signed)
Cardiology Office Note:    Date:  05/17/2018   ID:  [name], DOB 02-12-1946, MRN [id number]  PCP:  Debbrah Alar, NP  Cardiologist:  Candee Furbish, MD  Electrophysiologist:  None   Referring MD: Debbrah Alar, NP    History of Present Illness:    [name] is a 73 y.o. male with coronary artery disease diabetes with prior stroke residual left hemiplegia hyperlipidemia paroxysmal A. fib on Coumadin obstructive sleep apnea on CPAP CAD post CABG diastolic heart failure chronic who came in with chest pain and fatigue in December 2019.  Underwent cardiac catheterization which showed 3 out of 4 grafts patent chronic occlusion of SVG to left circumflex.  Medical therapy.  Mild nonspecific T wave inversions were noted in the anterolateral leads, troponins were flat.  Rhythm, on Coumadin resumed.  Lopressor was discontinued because of bradycardia.  No evidence of volume overload.  Echocardiogram on 02/18/2018 showed normal ejection fraction with grade 1 diastolic dysfunction.   Past Medical History:  Diagnosis Date  . Acute encephalopathy 12/12/2015  . Acute respiratory failure with hypoxia and hypercapnia ([place]) 12/12/2015  . Allergic rhinitis 02/13/2010   Qualifier: Diagnosis of  By: Wynona Luna   . Angina at rest [facility]) 10/22/2015  . Ataxia 08/10/2013  . B12 deficiency 08/11/2013  . BACK PAIN, LUMBAR 01/29/2009   Qualifier: Diagnosis of  By: Wynona Luna   . BENIGN POSITIONAL VERTIGO 11/23/2009   Qualifier: Diagnosis of  By: Wynona Luna   . Chronic diastolic heart failure ([place]) 04/24/2015  . Chronic pain    left sided-Kristeins  . Coronary 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 ([place]) 11/04/2015   s/p CABG 2004; CFX DES 2011  . Essential hypertension 12/12/2006   Qualifier: Diagnosis of  By: [name] RMA, Lucy    . GERD 12/12/2006   Qualifier: Diagnosis of  By: [name] RMA, Lucy    . GOUT 12/12/2006   Qualifier: Diagnosis of  By: Reatha Armour,  Lucy    . Hyperlipidemia LDL goal <70 12/12/2006   Qualifier: Diagnosis of  By: [name] RMA, Lucy     . Hypogonadism male 04/10/2011  . Hypothyroidism, acquired 02/15/2013  . Insomnia 05/21/2013  . Long term current use of anticoagulant therapy 06/24/2011  . Obstructive sleep apnea-Failed CPAP 06/17/2007   Failed CPAP Using O 2 for sleep   . Paroxysmal atrial fibrillation ([place]) 12/06/2016  . 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 Thoracic aorta atherosclerosis ([place]) 04/27/2017  . Tubular adenoma of colon 05/2010  . Type 2 diabetes mellitus without complication, without long-term current use of insulin ([place]) 10/05/2007   Qualifier: Diagnosis of  By: Wynona Luna   . Vasovagal syncope     Past Surgical History:  Procedure Laterality Date  . CARDIAC CATHETERIZATION N/A 10/25/2015   Procedure: Left Heart Cath and Cors/Grafts Angiography;  Surgeon: Troy Sine, MD;  Location: [place] CV LAB;  Service: Cardiovascular;  Laterality: N/A;  . CARDIOVERSION  06/20/2011   Procedure: CARDIOVERSION;  Surgeon: Larey Dresser, MD;  Location: [facility] ENDOSCOPY;  Service: Cardiovascular;  Laterality: N/A;  . CARDIOVERSION N/A 04/26/2015   Procedure: CARDIOVERSION;  Surgeon: Dorothy Spark, MD;  Location: [facility] ENDOSCOPY;  Service: Cardiovascular;  Laterality: N/A;  . CARDIOVERSION N/A 05/10/2015   Procedure: CARDIOVERSION;  Surgeon: Larey Dresser, MD;  Location: [place];  Service: Cardiovascular;  Laterality: N/A;  .  CATARACT EXTRACTION  05/2010   left eye  . CATARACT EXTRACTION  04/2010   right eye  . CORONARY ARTERY BYPASS GRAFT     stent  . ESOPHAGOGASTRODUODENOSCOPY  03-25-2005  . LEFT HEART CATH AND CORS/GRAFTS ANGIOGRAPHY N/A 02/19/2018   Procedure: LEFT HEART CATH AND CORS/GRAFTS ANGIOGRAPHY;  Surgeon: Martinique, Peter M, MD;  Location: [place] CV LAB;  Service: Cardiovascular;  Laterality: N/A;  . NASAL SEPTUM SURGERY    . PERCUTANEOUS PLACEMENT  INTRAVASCULAR STENT CERVICAL CAROTID ARTERY     03-2009; using a drug-eluting platform of the circumflex cornoray artery with a 3.0 x 18 [employer] Promus drug-eluting platform post dilated to 3.75 with a noncompliant balloon.  . TEE WITHOUT CARDIOVERSION  06/20/2011   Procedure: TRANSESOPHAGEAL ECHOCARDIOGRAM (TEE);  Surgeon: Larey Dresser, MD;  Location: [facility] ENDOSCOPY;  Service: Cardiovascular;  Laterality: N/A;    Current Medications: Current Meds  Medication Sig  . allopurinol (ZYLOPRIM) 100 MG tablet Take 2 tablets (200 mg total) by mouth daily.  Marland Kitchen atorvastatin (LIPITOR) 40 MG tablet Take 1 tablet (40 mg total) by mouth daily.  . benazepril (LOTENSIN) 10 MG tablet Take 1 tablet (10 mg total) by mouth daily.  . benazepril (LOTENSIN) 5 MG tablet TAKE 1 TABLET BY MOUTH DAILY WITH 10MG  OF BENAZEPRIL TO EQUAL 15MG  DAILY  . fluocinonide gel (LIDEX) 0.05 % Apply to scalp once daily as needed  . fluticasone (FLONASE) 50 MCG/ACT nasal spray Place 2 sprays into both nostrils daily.  . furosemide (LASIX) 40 MG tablet TAKE 1 TABLET BY MOUTH EVERY OTHER DAY  . isosorbide mononitrate (IMDUR) 30 MG 24 hr tablet TAKE 0.5 TABLETS (15 MG TOTAL) BY MOUTH DAILY.  Marland Kitchen levothyroxine (SYNTHROID, LEVOTHROID) 112 MCG tablet TAKE 1 TABLET (112 MCG TOTAL) BY MOUTH DAILY BEFORE BREAKFAST.  Marland Kitchen meclizine (ANTIVERT) 25 MG tablet Take 1 tablet (25 mg total) by mouth 3 (three) times daily as needed for dizziness.  . metFORMIN (GLUCOPHAGE) 500 MG tablet TAKE 1 TABLET (500 MG TOTAL) DAILY WITH BREAKFAST BY MOUTH.  . Multiple Vitamins-Minerals (MULTIVITAMIN GUMMIES MENS PO) Take 1 tablet by mouth daily with breakfast.   . nitroGLYCERIN (NITROSTAT) 0.4 MG SL tablet Place 1 tablet (0.4 mg total) under the tongue every 5 (five) minutes as needed for chest pain. Up to 3 doses  . OXYGEN Inhale 2 L into the lungs at bedtime.  . pantoprazole (PROTONIX) 40 MG tablet TAKE 1 TABLET (40 MG TOTAL) EVERY MORNING BY MOUTH.  . traMADol  (ULTRAM) 50 MG tablet TAKE 1 TABLET BY MOUTH TWICE A DAY  . traMADol (ULTRAM-ER) 200 MG 24 hr tablet TAKE 1 TABLET EVERY DAY  . vitamin B-12 1000 MCG tablet Take 1 tablet (1,000 mcg total) by mouth daily.  Marland Kitchen warfarin (COUMADIN) 2.5 MG tablet TAKE AS DIRECTED BY COUMADIN CLINIC     Allergies:   Patient has no known allergies.   Social History   Socioeconomic History  . Marital status: Married    Spouse name: [name]  . Number of children: Not on file  . Years of education: 16  . Highest education level: Not on file  Occupational History  . 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 Financial resource strain: Not on file  . Food insecurity:    Worry: Not on file    Inability: Not on file  . Transportation needs:    Medical: Not on file    Non-medical:  Not on file  Tobacco Use  . Smoking status: Never Smoker  . Smokeless tobacco: Never Used  Substance and Sexual Activity  . Alcohol use: No    Alcohol/week: 0.0 standard drinks    Comment: 1 beer a month  . Drug use: No  . Sexual activity: Not Currently  Lifestyle  . Physical activity:    Days per week: Not on file    Minutes per session: Not on file  . Stress: Not on file  Relationships  . Social connections:    Talks on phone: Not on file    Gets together: Not on file    Attends religious service: Not on file    Active member of club or organization: Not on file    Attends meetings of clubs or organizations: Not on file    Relationship status: Not on file  Other Topics Concern  . Not on file  Social History Narrative   Pt lives with wife. Does have stairs, but patient doesn't use them. Pt has completed technical school     Family History: The patient's family history includes Lung cancer in his father; Stroke in his mother.  ROS:   Please see the history of present illness.    Utilizing walker.  Denies any bleeding fevers chills nausea vomiting syncope bleeding all  other systems reviewed and are negative.  EKGs/Labs/Other Studies Reviewed:    The following studies were reviewed today:  Cardiac catheterization 02/19/2018: 1. Left dominant circulation 2. Severe 3 vessel occlusive CAD.    - 95% ostial LAD    - 100% first diagonal    - 100% ostial LCx. This is new since 2017 - was 95% prior    - 100% nondominant RCA 3. Patent LIMA to the LAD. There is chronic severe disease in the apical LAD 4. Patent SVG to the first diagonal and SVG to the first OM as a Y graft. 5. Chronic occlusion of the SVG to terminal LCx 6. Normal LVEDP  EKG:  EKG is not ordered today.    Recent Labs: 01/27/2018: ALT 44; TSH 1.47 02/20/2018: Hemoglobin 11.9; Platelets 144 05/11/2018: BUN 27; Creatinine, Ser 1.12; Potassium 4.4; Sodium 140  Recent Lipid Panel    Component Value Date/Time   CHOL 117 02/18/2018 0236   CHOL 138 12/05/2016 1100   TRIG 130 02/18/2018 0236   HDL 34 (L) 02/18/2018 0236   HDL 46 12/05/2016 1100   CHOLHDL 3.4 02/18/2018 0236   VLDL 26 02/18/2018 0236   LDLCALC 57 02/18/2018 0236   LDLCALC 67 12/05/2016 1100    Physical Exam:    VS:  BP 122/60   Pulse 69   Ht 6\' 3"  (1.905 m)   Wt 263 lb 12.8 oz (119.7 kg)   SpO2 92%   BMI 32.97 kg/m     Wt Readings from Last 3 Encounters:  05/17/18 263 lb 12.8 oz (119.7 kg)  05/11/18 261 lb (118.4 kg)  03/04/18 255 lb (115.7 kg)     GEN:  Well nourished, well developed in no acute distress HEENT: Normal NECK: No JVD; No carotid bruits LYMPHATICS: No lymphadenopathy CARDIAC: RRR, no murmurs, rubs, gallops RESPIRATORY:  Clear to auscultation without rales, wheezing or rhonchi  ABDOMEN: Soft, non-tender, non-distended MUSCULOSKELETAL:  No edema; No deformity  SKIN: Warm and dry NEUROLOGIC:  Alert and oriented x 3, mild left-sided weakness PSYCHIATRIC:  Normal affect   ASSESSMENT:    1. Paroxysmal atrial fibrillation (HCC)   2. Chronic diastolic congestive heart  failure ([place])   3. History  of stroke with residual deficit   4. Hyperlipidemia LDL goal <70    PLAN:    In order of problems listed above:  Coronary artery disease status post CABG - 1 of the 4 grafts were down in 2019, distal circumflex graft.  Overall reassuring otherwise.  Medical management.  Explained to him this finding, he was under the impression that all grafts were open.  Explained that even though the distal circumflex graft was closed that this had been closed for quite some time and was chronic and to continue with his medical management.  Reassurance. -No further chest pain.  LDL 57, statin  Paroxysmal atrial fibrillation - Was in sinus rhythm at the end of the hospitalization.  Continue with Coumadin.  We discussed the potential for NOAC, he would like to continue with Coumadin.  No bleeding.  Chronic diastolic heart failure -Normal EF.  No evidence of volume overload.  Stroke -Mild left hemiplegia, aspirin Lipitor.  Angina - Continuing with isosorbide.  No recent episodes.  Doing well.  Hypothyroidism - Synthroid.  Diabetes with hypertension - Excellent hemoglobin A1c 6.7.  Currently well controlled.    Medication Adjustments/Labs and Tests Ordered: Current medicines are reviewed at length with the patient today.  Concerns regarding medicines are outlined above.  No orders of the defined types were placed in this encounter.  No orders of the defined types were placed in this encounter.   Patient Instructions  Medication Instructions:  The current medical regimen is effective;  continue present plan and medications.  If you need a refill on your cardiac medications before your next appointment, please call your pharmacy.   Follow-Up: At [facility], you and your health needs are our priority.  As part of our continuing mission to provide you with exceptional heart care, we have created designated Provider Care Teams.  These Care Teams include your primary Cardiologist (physician)  and Advanced Practice Providers (APPs -  Physician Assistants and Nurse Practitioners) who all work together to provide you with the care you need, when you need it. You will need a follow up appointment in 6 months with Cecilie Kicks, NP and 1 yr with [name].  Please call our office 2 months in advance to schedule this appointment.  You may see Candee Furbish, MD or one of the following Advanced Practice Providers on your designated Care Team:   Truitt Merle, NP Cecilie Kicks, NP . Kathyrn Drown, NP  Thank you for choosing [facility]!!         Signed, Candee Furbish, MD  05/17/2018 11:38 AM    [place]

## 2018-05-28 ENCOUNTER — Other Ambulatory Visit: Payer: Self-pay

## 2018-05-28 ENCOUNTER — Encounter: Payer: Medicare Other | Attending: Physical Medicine & Rehabilitation

## 2018-05-28 ENCOUNTER — Ambulatory Visit (HOSPITAL_BASED_OUTPATIENT_CLINIC_OR_DEPARTMENT_OTHER): Payer: Medicare Other | Admitting: Physical Medicine & Rehabilitation

## 2018-05-28 ENCOUNTER — Encounter: Payer: Self-pay | Admitting: Physical Medicine & Rehabilitation

## 2018-05-28 VITALS — BP 145/67 | HR 70 | Ht 75.0 in | Wt 260.0 lb

## 2018-05-28 DIAGNOSIS — G8110 Spastic hemiplegia affecting unspecified side: Secondary | ICD-10-CM

## 2018-05-28 MED ORDER — TRAMADOL HCL ER 200 MG PO TB24: 200.0000 mg | ORAL_TABLET | Freq: Every day | ORAL | 1 refills | Status: DC

## 2018-05-28 NOTE — Progress Notes (Signed)
Botox Injection for spasticity using needle EMG guidance  Dilution: 50 Units/ml Indication: Severe spasticity which interferes with ADL,mobility and/or  hygiene and is unresponsive to medication management and other conservative care Informed consent was obtained after describing risks and benefits of the procedure with the patient. This includes bleeding, bruising, infection, excessive weakness, or medication side effects. A REMS form is on file and signed. Needle: 27g 1" needle electrode Number of units per muscle  [id number] All injections were done after obtaining appropriate EMG activity and after negative drawback for blood. The patient tolerated the procedure well. Post procedure instructions were given. A followup appointment was made.

## 2018-05-31 ENCOUNTER — Other Ambulatory Visit: Payer: Self-pay | Admitting: Family

## 2018-06-03 ENCOUNTER — Ambulatory Visit: Payer: Medicare Other | Admitting: Physical Medicine & Rehabilitation

## 2018-06-18 ENCOUNTER — Ambulatory Visit (INDEPENDENT_AMBULATORY_CARE_PROVIDER_SITE_OTHER): Payer: Medicare Other | Admitting: Family

## 2018-06-18 ENCOUNTER — Other Ambulatory Visit: Payer: Self-pay

## 2018-06-18 DIAGNOSIS — M5431 Sciatica, right side: Secondary | ICD-10-CM | POA: Diagnosis not present

## 2018-06-18 MED ORDER — BLOOD GLUCOSE MONITOR KIT: PACK | 0 refills | Status: DC

## 2018-06-18 MED ORDER — METHYLPREDNISOLONE 4 MG PO TABS: ORAL_TABLET | ORAL | 0 refills | Status: DC

## 2018-06-18 NOTE — Progress Notes (Signed)
Virtual Visit via Telephone Note  I connected with [name] on 06/18/18 at  8:40 AM EDT by telephone and verified that I am speaking with the correct person using two identifiers.   I discussed the limitations, risks, security and privacy concerns of performing an evaluation and management service by telephone and the availability of in person appointments. I also discussed with the patient that there may be a patient responsible charge related to this service. The patient expressed understanding and agreed to proceed.   History of Present Illness:  Right hip pain- pt reports + right sided hip pain which has been present x 1 week.   Reports that his back has been bothering him. He has been using a back brace which seems to help. Reports that he has pain in the back of his right buttock.  Pain jarring and worse with certain movements.  Pain is improved by "sitting still."  He is using tramadol which helps "a little bit."     Lab Results  Component Value Date   HGBA1C 6.7 (H) 05/11/2018    Observations/Objective:   Gen: Awake, alert, no acute distress Resp: Breathing is even and non-labored Psych: calm/pleasant demeanor Neuro: Alert and Oriented x 3, speech is clear.  Assessment and Plan:  Sciatica- will rx with medrol dose pak. Already on tramadol and he is on coumadin so need to avoid nsaids.   DM2- not checking sugars at home. Does not have a glucometer. States his daughter knows how to use it and can help him. Will fax rx to his pharmacy for glucometer, strips/lancets. Pt is advised to check sugar once daily while on medrol dose pak and to call if sugar >300.   Pt is advised to call if sciatica pain is not improved in 1 week.    Follow Up Instructions:    I discussed the assessment and treatment plan with the patient. The patient was provided an opportunity to ask questions and all were answered. The patient agreed with the plan and demonstrated an understanding of the  instructions.   The patient was advised to call back or seek an in-person evaluation if the symptoms worsen or if the condition fails to improve as anticipated.  I provided 11 minutes of non-face-to-face time during this encounter.   [name], NP

## 2018-06-22 ENCOUNTER — Other Ambulatory Visit: Payer: Self-pay

## 2018-06-22 ENCOUNTER — Encounter: Payer: Self-pay | Admitting: Physical Medicine & Rehabilitation

## 2018-06-22 ENCOUNTER — Ambulatory Visit (HOSPITAL_BASED_OUTPATIENT_CLINIC_OR_DEPARTMENT_OTHER): Payer: Medicare Other | Admitting: Physical Medicine & Rehabilitation

## 2018-06-22 ENCOUNTER — Encounter: Payer: Medicare Other | Attending: Physical Medicine & Rehabilitation

## 2018-06-22 VITALS — BP 126/73 | HR 73 | Resp 14 | Ht 75.0 in | Wt 260.0 lb

## 2018-06-22 DIAGNOSIS — M545 Low back pain, unspecified: Secondary | ICD-10-CM

## 2018-06-22 DIAGNOSIS — G8111 Spastic hemiplegia affecting right dominant side: Secondary | ICD-10-CM | POA: Diagnosis not present

## 2018-06-22 DIAGNOSIS — G8929 Other chronic pain: Secondary | ICD-10-CM

## 2018-06-22 DIAGNOSIS — G8110 Spastic hemiplegia affecting unspecified side: Secondary | ICD-10-CM

## 2018-06-22 NOTE — Progress Notes (Signed)
Subjective:    Patient ID: [name], male    DOB: [birth year], 73 y.o.   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  His primary complaint today is right-sided low back pain.  He denies any trauma to that area.  He states that it started after riding his lawnmower.  He had a similar episode in 2017 at which time he underwent MRI of the lumbar spine showing multilevel moderate severe lumbar spondylosis with central canal stenosis at L3-L4.  He underwent neurosurgical evaluation they did not recommend surgical intervention. The patient states that his pain does not radiate into his lower extremities. He has no lower extremity weakness or numbness.  No loss of bowel or bladder function.  Pain Inventory Average Pain 7 Pain Right Now 7 My pain is dull and aching  In the last 24 hours, has pain interfered with the following? General activity . Relation with others . Enjoyment of life . What TIME of day is your pain at its worst? all Sleep (in general) Poor  Pain is worse with: walking, bending, sitting, standing and some activites Pain improves with: medication and injections Relief from Meds: 5  Mobility walk without assistance walk with assistance  Function retired  Neuro/Psych trouble walking  Prior Studies Any changes since last visit?  no  Physicians involved in your care Any changes since last visit?  no   Family History  Problem Relation Age of Onset  . Lung cancer Father        deceased  . Stroke Mother        deceased-MINISTROKES   Social History   Socioeconomic History  . Marital status: Married    Spouse name: [name]  . Number of children: Not on file  . Years of education: 57  . Highest education level: Not on file  Occupational History  . 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 Financial resource  strain: Not on file  . Food insecurity:    Worry: Not on file    Inability: Not on file  . Transportation needs:    Medical: Not on file    Non-medical: Not on file  Tobacco Use  . Smoking status: Never Smoker  . Smokeless tobacco: Never Used  Substance and Sexual Activity  . Alcohol use: No    Alcohol/week: 0.0 standard drinks    Comment: 1 beer a month  . Drug use: No  . Sexual activity: Not Currently  Lifestyle  . Physical activity:    Days per week: Not on file    Minutes per session: Not on file  . Stress: Not on file  Relationships  . Social connections:    Talks on phone: Not on file    Gets together: Not on file    Attends religious service: Not on file    Active member of club or organization: Not on file    Attends meetings of clubs or organizations: Not on file    Relationship status: Not on file  Other Topics Concern  . Not on file  Social History Narrative   Pt lives with wife. Does have stairs, but patient doesn't use them. Pt has completed technical school   Past Surgical History:  Procedure Laterality Date  . CARDIAC CATHETERIZATION N/A 10/25/2015   Procedure: Left Heart Cath and Cors/Grafts Angiography;  Surgeon: Marcello Moores  Floyce Stakes, MD;  Location: [place] [facility];  Service: Cardiovascular;  Laterality: N/A;  . CARDIOVERSION  06/20/2011   Procedure: CARDIOVERSION;  Surgeon: Larey Dresser, MD;  Location: [facility] ENDOSCOPY;  Service: Cardiovascular;  Laterality: N/A;  . CARDIOVERSION N/A 04/26/2015   Procedure: CARDIOVERSION;  Surgeon: Dorothy Spark, MD;  Location: [facility] ENDOSCOPY;  Service: Cardiovascular;  Laterality: N/A;  . CARDIOVERSION N/A 05/10/2015   Procedure: CARDIOVERSION;  Surgeon: Larey Dresser, MD;  Location: [place];  Service: Cardiovascular;  Laterality: N/A;  . CATARACT EXTRACTION  05/2010   left eye  . CATARACT EXTRACTION  04/2010   right eye  . CORONARY ARTERY BYPASS GRAFT     stent  . ESOPHAGOGASTRODUODENOSCOPY  03-25-2005  . LEFT HEART  CATH AND CORS/GRAFTS ANGIOGRAPHY N/A 02/19/2018   Procedure: LEFT HEART CATH AND CORS/GRAFTS ANGIOGRAPHY;  Surgeon: Martinique, Peter M, MD;  Location: [facility] [facility];  Service: Cardiovascular;  Laterality: N/A;  . NASAL SEPTUM SURGERY    . PERCUTANEOUS PLACEMENT INTRAVASCULAR STENT CERVICAL CAROTID ARTERY     03-2009; using a drug-eluting platform of the circumflex cornoray artery with a 3.0 x 18 [employer] Promus drug-eluting platform post dilated to 3.75 with a noncompliant balloon.  . TEE WITHOUT CARDIOVERSION  06/20/2011   Procedure: TRANSESOPHAGEAL ECHOCARDIOGRAM (TEE);  Surgeon: Larey Dresser, MD;  Location: [place];  Service: Cardiovascular;  Laterality: N/A;   Past Medical History:  Diagnosis Date  . Acute encephalopathy 12/12/2015  . Acute respiratory failure with hypoxia and hypercapnia ([place]) 12/12/2015  . Allergic rhinitis 02/13/2010   Qualifier: Diagnosis of  By: Wynona Luna   . Angina at rest [facility]) 10/22/2015  . Ataxia 08/10/2013  . B12 deficiency 08/11/2013  . BACK PAIN, LUMBAR 01/29/2009   Qualifier: Diagnosis of  By: Wynona Luna   . BENIGN POSITIONAL VERTIGO 11/23/2009   Qualifier: Diagnosis of  By: Wynona Luna   . Chronic diastolic heart failure ([place]) 04/24/2015  . Chronic pain    left sided-[place]  . 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 Essential hypertension 12/12/2006   Qualifier: Diagnosis of  By: [name] RMA, Lucy    . GERD 12/12/2006   Qualifier: Diagnosis of  By: [name] RMA, Lucy    . GOUT 12/12/2006   Qualifier: Diagnosis of  By: Reatha Armour, Lucy    . Hyperlipidemia LDL goal <70 12/12/2006   Qualifier: Diagnosis of  By: [name] RMA, Lucy     . Hypogonadism male 04/10/2011  . Hypothyroidism, acquired 02/15/2013  . Insomnia 05/21/2013  . Long term current use of anticoagulant therapy 06/24/2011  . Obstructive sleep apnea-Failed CPAP 06/17/2007   Failed CPAP Using O 2 for  sleep   . Paroxysmal atrial fibrillation ([place]) 12/06/2016  . Stroke (cerebrum) ([place]) 01/13/2017   also 1996; resultant hemiplegia of dominant side  . Thoracic aorta atherosclerosis ([place]) 04/27/2017  . Tubular adenoma of colon 05/2010  . Type 2 diabetes mellitus without complication, without long-term current use of insulin ([place]) 10/05/2007   Qualifier: Diagnosis of  By: Wynona Luna   . Vasovagal syncope    BP 126/73   Pulse 73   Resp 14   Ht 6\' 3"  (1.905 m)   Wt 260 lb (117.9 kg)   SpO2 95%   BMI 32.50 kg/m   Opioid Risk Score:   Fall Risk Score:  `1  Depression screen PHQ 2/9  Depression screen [facility] 2/9 07/28/2017 07/13/2017 05/21/2016 03/05/2015 01/01/2015  Decreased Interest 0 0 0 0 3  Down, Depressed, Hopeless 0 0 0 0 0  PHQ - 2 Score 0 0 0 0 3  Altered sleeping - - 1 - 0  Tired, decreased energy - - 1 - 1  Change in appetite - - 0 - 0  Feeling bad or failure about yourself  - - 0 - 0  Trouble concentrating - - 0 - 0  Moving slowly or fidgety/restless - - 0 - 0  Suicidal thoughts - - 0 - 0  PHQ-9 Score - - 2 - 4  Difficult doing work/chores - - - - Not difficult at all  Some recent data might be hidden    Review of Systems  Constitutional: Negative.   HENT: Negative.   Eyes: Negative.   Respiratory: Negative.   Cardiovascular: Negative.   Gastrointestinal: Negative.   Endocrine: Negative.   Genitourinary: Negative.   Musculoskeletal: Positive for gait problem.  Skin: Negative.   Allergic/Immunologic: Negative.   Hematological: Negative.   Psychiatric/Behavioral: Negative.        Objective:   Physical Exam Vitals signs and nursing note reviewed.  Constitutional:      Appearance: Normal appearance.  HENT:     Head: Normocephalic and atraumatic.     Mouth/Throat:     Mouth: Mucous membranes are moist.  Eyes:     Extraocular Movements: Extraocular movements intact.     Pupils: Pupils are equal, round, and reactive to light.  Neck:      Musculoskeletal: Normal range of motion.  Musculoskeletal:     Comments: Lumbar spine with decreased extension he has a forward flexed posture.  He does have some buttock pain when he extends to neutral.  He is able to reach forward and has 50% range with forward flexion. He is able to get his shoes on and off. Gait stenotic appearing has some hip hiking on the left side.  Skin:    General: Skin is warm and dry.  Neurological:     Mental Status: He is alert.     Comments: Motor strength is 4- in the left deltoid bicep tricep grip hip flexor knee extensor and ankle dorsiflexor There is increased tone in the left biceps as well as the left finger flexors.  In addition there is some increased tone in the left hamstring MAS 2 in the hamstring biceps and finger flexors.  Right lower extremity is 5/5 strength There is normal sensation to pinprick in the right L2-L3-L4 L5-S1 dermatome distribution  Right foot has foot intrinsic muscle atrophy no skin lesions he does not have a right great toenail             Assessment & Plan:  1.  Left spastic hemiplegia secondary CVA overall improved after Botox injection right hamstring he does feel like his elbow flexion and finger flexion is tight on the left side and he would like to resume Botox injections in the hand. Will add left FDP 25 units left FDS 25 units and left biceps 50 units  2.  Acute exacerbation of chronic low back pain he has no signs of radiculopathy.  He does have sciatic type symptoms into the buttock area.  His primary care physician started him on steroid Dosepak.  He is not sure if this is helping.  He asked for a steroid shot but we discussed that he should do both a steroid  shot and a Dosepak.  He is a diabetic. In addition we discussed epidural steroid injection if the steroid Dosepak is not helpful however he would have to come off his Coumadin for 7 days prior to the procedure and he is unwilling to do this at the current  time. He will continue his tramadol 50 mg a day as well as tramadol extended release 200 mg a day.  We discussed using gabapentin which he states "does not work" Given his stroke history and gait imbalance I would avoid stronger narcotic analgesics on this patient also is not a candidate for nonsteroidal anti-inflammatories due to Coumadin and history of stroke

## 2018-06-22 NOTE — Patient Instructions (Addendum)
Will do Botox next visit  Please continue the medrol dose pack  In order to do epidural injection, you would need to stop coumadin for 1 wk which may put you at risk for stroke or cardiac event

## 2018-06-25 ENCOUNTER — Telehealth: Payer: Self-pay

## 2018-06-25 NOTE — Telephone Encounter (Signed)

## 2018-06-28 ENCOUNTER — Ambulatory Visit (INDEPENDENT_AMBULATORY_CARE_PROVIDER_SITE_OTHER): Payer: Medicare Other | Admitting: Pharmacist Clinician (PhC)/ Clinical Pharmacy Specialist

## 2018-06-28 ENCOUNTER — Other Ambulatory Visit: Payer: Self-pay

## 2018-06-28 DIAGNOSIS — Z7901 Long term (current) use of anticoagulants: Secondary | ICD-10-CM

## 2018-06-28 DIAGNOSIS — I4891 Unspecified atrial fibrillation: Secondary | ICD-10-CM | POA: Diagnosis not present

## 2018-06-28 LAB — POCT INR: INR: 1.9 — AB (ref 2.0–3.0)

## 2018-07-06 ENCOUNTER — Other Ambulatory Visit: Payer: Self-pay | Admitting: Family

## 2018-07-09 ENCOUNTER — Ambulatory Visit: Payer: Medicare Other | Admitting: Physical Medicine & Rehabilitation

## 2018-07-10 ENCOUNTER — Other Ambulatory Visit: Payer: Self-pay | Admitting: Internal Medicine

## 2018-07-12 NOTE — Telephone Encounter (Signed)
Refill Request.  

## 2018-08-05 ENCOUNTER — Other Ambulatory Visit: Payer: Self-pay | Admitting: Internal Medicine

## 2018-08-06 ENCOUNTER — Telehealth: Payer: Self-pay

## 2018-08-06 NOTE — Telephone Encounter (Signed)

## 2018-08-10 ENCOUNTER — Other Ambulatory Visit: Payer: Self-pay

## 2018-08-10 ENCOUNTER — Ambulatory Visit (INDEPENDENT_AMBULATORY_CARE_PROVIDER_SITE_OTHER): Payer: Medicare Other | Admitting: Pharmacist

## 2018-08-10 ENCOUNTER — Ambulatory Visit (INDEPENDENT_AMBULATORY_CARE_PROVIDER_SITE_OTHER): Payer: Medicare Other | Admitting: Family

## 2018-08-10 DIAGNOSIS — K219 Gastro-esophageal reflux disease without esophagitis: Secondary | ICD-10-CM

## 2018-08-10 DIAGNOSIS — Z7901 Long term (current) use of anticoagulants: Secondary | ICD-10-CM | POA: Diagnosis not present

## 2018-08-10 DIAGNOSIS — I10 Essential (primary) hypertension: Secondary | ICD-10-CM | POA: Diagnosis not present

## 2018-08-10 DIAGNOSIS — M545 Low back pain, unspecified: Secondary | ICD-10-CM

## 2018-08-10 DIAGNOSIS — G8929 Other chronic pain: Secondary | ICD-10-CM | POA: Diagnosis not present

## 2018-08-10 DIAGNOSIS — E039 Hypothyroidism, unspecified: Secondary | ICD-10-CM

## 2018-08-10 DIAGNOSIS — I4891 Unspecified atrial fibrillation: Secondary | ICD-10-CM | POA: Diagnosis not present

## 2018-08-10 DIAGNOSIS — E119 Type 2 diabetes mellitus without complications: Secondary | ICD-10-CM

## 2018-08-10 LAB — POCT INR: INR: 2.1 (ref 2.0–3.0)

## 2018-08-10 MED ORDER — BLOOD GLUCOSE MONITOR KIT: PACK | 0 refills | Status: DC

## 2018-08-10 NOTE — Progress Notes (Signed)
Virtual Visit via Telephone Note  I connected with [name] on 08/10/18 at 10:20 AM EDT by telephone and verified that I am speaking with the correct person using two identifiers.  Location: Patient: home Provider: office   I discussed the limitations, risks, security and privacy concerns of performing an evaluation and management service by telephone and the availability of in person appointments. I also discussed with the patient that there may be a patient responsible charge related to this service. The patient expressed understanding and agreed to proceed.   History of Present Illness:    DM2- not checking sugars regularly. Continues metformin. Reports that he continues to adhere to DM diet.  Lab Results  Component Value Date   HGBA1C 6.7 (H) 05/11/2018   HGBA1C 6.7 (H) 01/27/2018   HGBA1C 6.9 (H) 10/12/2017   Lab Results  Component Value Date   MICROALBUR <0.7 09/26/2014   LDLCALC 57 02/18/2018   CREATININE 1.12 05/11/2018   HTN- last reading last week was 142/78. Maintained on lotensin, furosemide. BP Readings from Last 3 Encounters:  06/22/18 126/73  05/28/18 (!) 145/67  05/17/18 122/60   Hypothyroid- reports that his energy is stable- down but stable.  Lab Results  Component Value Date   TSH 1.47 01/27/2018   GERD- reports no recent gerd symptoms on PPI.  Chronic low back pain- continues tramadol and botox for left spastic hemiplegia. This is being managed by Dr. Ella Bodo   Observations/Objective:   Gen: Awake, alert, no acute distress Resp: Breathing is even and non-labored Psych: calm/pleasant demeanor Neuro: Alert and Oriented x 3, speech sounds clear.  Assessment and Plan:  HTN-bp stable continue current meds.  DM2- clinically stable. Not checking sugars. Faxed rx to his pharmacy for glucometer/strips/lancets. Continue metformin.  Hypothyroid- clinically stable on synthroid. Continue same.   GERD- stable on PPI, continue same.    Chronic low back pain- stable- management per Dr Tasia Catchings.    Follow Up Instructions:    I discussed the assessment and treatment plan with the patient. The patient was provided an opportunity to ask questions and all were answered. The patient agreed with the plan and demonstrated an understanding of the instructions.   The patient was advised to call back or seek an in-person evaluation if the symptoms worsen or if the condition fails to improve as anticipated.  I provided 15 minutes of non-face-to-face time during this encounter.   [name], NP

## 2018-08-31 ENCOUNTER — Other Ambulatory Visit: Payer: Self-pay

## 2018-08-31 ENCOUNTER — Encounter: Payer: Medicare Other | Admitting: Physical Medicine & Rehabilitation

## 2018-08-31 MED ORDER — ATORVASTATIN CALCIUM 40 MG PO TABS: 40.0000 mg | ORAL_TABLET | Freq: Every day | ORAL | 3 refills | Status: DC

## 2018-09-01 ENCOUNTER — Other Ambulatory Visit: Payer: Self-pay | Admitting: Family

## 2018-09-01 ENCOUNTER — Other Ambulatory Visit: Payer: Self-pay

## 2018-09-01 MED ORDER — ATORVASTATIN CALCIUM 40 MG PO TABS: 40.0000 mg | ORAL_TABLET | Freq: Every day | ORAL | 3 refills | Status: DC

## 2018-09-02 ENCOUNTER — Encounter: Payer: Self-pay | Admitting: Physical Medicine & Rehabilitation

## 2018-09-02 ENCOUNTER — Other Ambulatory Visit: Payer: Self-pay

## 2018-09-02 ENCOUNTER — Encounter: Payer: Medicare Other | Attending: Physical Medicine & Rehabilitation | Admitting: Physical Medicine & Rehabilitation

## 2018-09-02 VITALS — BP 142/78 | HR 69 | Temp 98.5°F | Ht 75.0 in | Wt 255.0 lb

## 2018-09-02 DIAGNOSIS — G8114 Spastic hemiplegia affecting left nondominant side: Secondary | ICD-10-CM | POA: Diagnosis not present

## 2018-09-02 DIAGNOSIS — G8111 Spastic hemiplegia affecting right dominant side: Secondary | ICD-10-CM | POA: Insufficient documentation

## 2018-09-02 DIAGNOSIS — G8112 Spastic hemiplegia affecting left dominant side: Secondary | ICD-10-CM

## 2018-09-02 NOTE — Patient Instructions (Addendum)
You received a Botox injection today. You may experience soreness at the needle injection sites. Please call us if any of the injection sites turns red after a couple days or if there is any drainage. You may experience muscle weakness as a result of Botox. This would improve with time but can take several weeks to improve. The Botox should start working in about one week. The Botox usually last 3 months. The injection can be repeated every 3 months as needed.   left FDP 25 units  left FDS 25 units left biceps 50 units [id number]

## 2018-09-02 NOTE — Progress Notes (Signed)
Botox Injection for spasticity using needle EMG guidance °  °Dilution: 50 Units/ml °Indication: Severe spasticity which interferes with ADL,mobility and/or  hygiene and is unresponsive to medication management and other conservative care °Informed consent was obtained after describing risks and benefits of the procedure with the patient. This includes bleeding, bruising, infection, excessive weakness, or medication side effects. A REMS form is on file and signed. °Needle: 27g 1" needle electrode °Number of units per muscle °  ° left FDP 25 units ° left FDS 25 units °left biceps 50 units °[id number] °All injections were done after obtaining appropriate EMG activity and after negative drawback for blood. The patient tolerated the procedure well. Post procedure instructions were given. A followup appointment was made.     °

## 2018-09-05 ENCOUNTER — Other Ambulatory Visit: Payer: Self-pay | Admitting: Family

## 2018-09-14 ENCOUNTER — Telehealth: Payer: Self-pay

## 2018-09-14 NOTE — Telephone Encounter (Signed)
lmom for prescreen  

## 2018-09-26 ENCOUNTER — Other Ambulatory Visit: Payer: Self-pay | Admitting: Family

## 2018-10-03 ENCOUNTER — Other Ambulatory Visit: Payer: Self-pay | Admitting: Family

## 2018-10-06 ENCOUNTER — Other Ambulatory Visit: Payer: Self-pay | Admitting: Internal Medicine

## 2018-10-06 NOTE — Telephone Encounter (Signed)
Refill Request.  

## 2018-10-11 ENCOUNTER — Encounter: Payer: Medicare Other | Attending: Physical Medicine & Rehabilitation | Admitting: Physical Medicine & Rehabilitation

## 2018-10-11 DIAGNOSIS — G8111 Spastic hemiplegia affecting right dominant side: Secondary | ICD-10-CM | POA: Insufficient documentation

## 2018-10-18 ENCOUNTER — Other Ambulatory Visit: Payer: Self-pay | Admitting: Internal Medicine

## 2018-10-20 ENCOUNTER — Ambulatory Visit (INDEPENDENT_AMBULATORY_CARE_PROVIDER_SITE_OTHER): Payer: Medicare Other | Admitting: Family

## 2018-10-20 ENCOUNTER — Other Ambulatory Visit: Payer: Self-pay

## 2018-10-20 DIAGNOSIS — M5431 Sciatica, right side: Secondary | ICD-10-CM | POA: Diagnosis not present

## 2018-10-20 MED ORDER — METHYLPREDNISOLONE 4 MG PO TABS: ORAL_TABLET | ORAL | 0 refills | Status: DC

## 2018-10-20 NOTE — Progress Notes (Signed)
Virtual Visit via Telephone Note  I connected with [name] on 10/20/18 at  8:00 AM EDT by telephone and verified that I am speaking with the correct person using two identifiers.  Location: Patient: home Provider: home   I discussed the limitations, risks, security and privacy concerns of performing an evaluation and management service by telephone and the availability of in person appointments. I also discussed with the patient that there may be a patient responsible charge related to this service. The patient expressed understanding and agreed to proceed.   History of Present Illness:  Patient is a 73 yr old male who presents today with chief complaint of low back pain. He reports that he was having "very bad back pain yesterday."  Took tylenol, pain has subsided a little bit. Worse with bending forward and bending back. Pain is located in the right lower back and into the right buttock.  He denies RLE weakness. L sided hemiparesis is reportedly unchanged.    Past Medical History:  Diagnosis Date  . Acute encephalopathy 12/12/2015  . Acute respiratory failure with hypoxia and hypercapnia ([place]) 12/12/2015  . Allergic rhinitis 02/13/2010   Qualifier: Diagnosis of  By: Wynona Luna   . Angina at rest [facility]) 10/22/2015  . Ataxia 08/10/2013  . B12 deficiency 08/11/2013  . BACK PAIN, LUMBAR 01/29/2009   Qualifier: Diagnosis of  By: Wynona Luna   . BENIGN POSITIONAL VERTIGO 11/23/2009   Qualifier: Diagnosis of  By: Wynona Luna   . Chronic diastolic heart failure ([place]) 04/24/2015  . Chronic pain    left sided-Kristeins  . Coronary 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 ([place]) 11/04/2015   s/p CABG 2004; CFX DES 2011  . Essential hypertension 12/12/2006   Qualifier: Diagnosis of  By: [name] RMA, Lucy    . GERD 12/12/2006   Qualifier: Diagnosis of  By: [name] RMA, Lucy    . GOUT 12/12/2006   Qualifier: Diagnosis of  By: Reatha Armour, Lucy    . Hyperlipidemia  LDL goal <70 12/12/2006   Qualifier: Diagnosis of  By: [name] RMA, Lucy     . Hypogonadism male 04/10/2011  . Hypothyroidism, acquired 02/15/2013  . Insomnia 05/21/2013  . Long term current use of anticoagulant therapy 06/24/2011  . Obstructive sleep apnea-Failed CPAP 06/17/2007   Failed CPAP Using O 2 for sleep   . Paroxysmal atrial fibrillation ([place]) 12/06/2016  . 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 Thoracic aorta atherosclerosis ([place]) 04/27/2017  . Tubular adenoma of colon 05/2010  . Type 2 diabetes mellitus without complication, without long-term current use of insulin ([place]) 10/05/2007   Qualifier: Diagnosis of  By: Wynona Luna   . Vasovagal syncope      Social History   Socioeconomic History  . Marital status: Married    Spouse name: [name]  . Number of children: Not on file  . Years of education: 65  . Highest education level: Not on file  Occupational History  . 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 Financial resource strain: Not on file  . Food insecurity    Worry: Not on file    Inability: Not on file  . Transportation needs    Medical: Not on file    Non-medical: Not on file  Tobacco Use  . Smoking status: Never Smoker  .  Smokeless tobacco: Never Used  Substance and Sexual Activity  . Alcohol use: No    Alcohol/week: 0.0 standard drinks    Comment: 1 beer a month  . Drug use: No  . Sexual activity: Not Currently  Lifestyle  . Physical activity    Days per week: Not on file    Minutes per session: Not on file  . Stress: Not on file  Relationships  . Social Herbalist on phone: Not on file    Gets together: Not on file    Attends religious service: Not on file    Active member of club or organization: Not on file    Attends meetings of clubs or organizations: Not on file    Relationship status: Not on file  . Intimate partner violence    Fear of  current or ex partner: Not on file    Emotionally abused: Not on file    Physically abused: Not on file    Forced sexual activity: Not on file  Other Topics Concern  . Not on file  Social History Narrative   Pt lives with wife. Does have stairs, but patient doesn't use them. Pt has completed technical school    Past Surgical History:  Procedure Laterality Date  . CARDIAC CATHETERIZATION N/A 10/25/2015   Procedure: Left Heart Cath and Cors/Grafts Angiography;  Surgeon: Troy Sine, MD;  Location: [place] CV LAB;  Service: Cardiovascular;  Laterality: N/A;  . CARDIOVERSION  06/20/2011   Procedure: CARDIOVERSION;  Surgeon: Larey Dresser, MD;  Location: [facility] ENDOSCOPY;  Service: Cardiovascular;  Laterality: N/A;  . CARDIOVERSION N/A 04/26/2015   Procedure: CARDIOVERSION;  Surgeon: Dorothy Spark, MD;  Location: [facility] ENDOSCOPY;  Service: Cardiovascular;  Laterality: N/A;  . CARDIOVERSION N/A 05/10/2015   Procedure: CARDIOVERSION;  Surgeon: Larey Dresser, MD;  Location: [place];  Service: Cardiovascular;  Laterality: N/A;  . CATARACT EXTRACTION  05/2010   left eye  . CATARACT EXTRACTION  04/2010   right eye  . CORONARY ARTERY BYPASS GRAFT     stent  . ESOPHAGOGASTRODUODENOSCOPY  03-25-2005  . LEFT HEART CATH AND CORS/GRAFTS ANGIOGRAPHY N/A 02/19/2018   Procedure: LEFT HEART CATH AND CORS/GRAFTS ANGIOGRAPHY;  Surgeon: Martinique, Peter M, MD;  Location: [place] CV LAB;  Service: Cardiovascular;  Laterality: N/A;  . NASAL SEPTUM SURGERY    . PERCUTANEOUS PLACEMENT INTRAVASCULAR STENT CERVICAL CAROTID ARTERY     03-2009; using a drug-eluting platform of the circumflex cornoray artery with a 3.0 x 18 [employer] Promus drug-eluting platform post dilated to 3.75 with a noncompliant balloon.  . TEE WITHOUT CARDIOVERSION  06/20/2011   Procedure: TRANSESOPHAGEAL ECHOCARDIOGRAM (TEE);  Surgeon: Larey Dresser, MD;  Location: [facility] ENDOSCOPY;  Service: Cardiovascular;  Laterality: N/A;     Family History  Problem Relation Age of Onset  . Lung cancer Father        deceased  . Stroke Mother        deceased-MINISTROKES    No Known Allergies  Current Outpatient Medications on File Prior to Visit  Medication Sig Dispense Refill  . ACCU-CHEK GUIDE test strip USE UP TO FOUR TIMES DAILY AS DIRECTED 100 strip 0  . allopurinol (ZYLOPRIM) 100 MG tablet TAKE 2 TABLETS BY MOUTH EVERY DAY 180 tablet 1  . atorvastatin (LIPITOR) 40 MG tablet Take 1 tablet (40 mg total) by mouth daily. 90 tablet 3  . benazepril (LOTENSIN) 10 MG tablet TAKE 1 TABLET BY MOUTH EVERY  DAY 90 tablet 3  . benazepril (LOTENSIN) 5 MG tablet TAKE 1 TABLET BY MOUTH DAILY WITH 10MG OF BENAZEPRIL TO EQUAL 15MG DAILY 90 tablet 2  . blood glucose meter kit and supplies KIT Dispense based on patient and insurance preference. Use up to four times daily as directed. (FOR ICD-9 250.00, 250.01). 1 each 0  . fluocinonide gel (LIDEX) 0.05 % Apply to scalp once daily as needed 60 g 0  . fluticasone (FLONASE) 50 MCG/ACT nasal spray Place 2 sprays into both nostrils daily. 9.9 g 1  . furosemide (LASIX) 40 MG tablet TAKE 1 TABLET BY MOUTH EVERY OTHER DAY 45 tablet 1  . isosorbide mononitrate (IMDUR) 30 MG 24 hr tablet TAKE 1/2 TABLET BY MOUTH EVERY DAY 45 tablet 3  . levothyroxine (SYNTHROID, LEVOTHROID) 112 MCG tablet TAKE 1 TABLET (112 MCG TOTAL) BY MOUTH DAILY BEFORE BREAKFAST. 90 tablet 1  . meclizine (ANTIVERT) 25 MG tablet Take 1 tablet (25 mg total) by mouth 3 (three) times daily as needed for dizziness. 30 tablet 0  . metFORMIN (GLUCOPHAGE) 500 MG tablet TAKE 1 TABLET (500 MG TOTAL) DAILY WITH BREAKFAST BY MOUTH. 90 tablet 1  . Multiple Vitamins-Minerals (MULTIVITAMIN GUMMIES MENS PO) Take 1 tablet by mouth daily with breakfast.     . nitroGLYCERIN (NITROSTAT) 0.4 MG SL tablet Place 1 tablet (0.4 mg total) under the tongue every 5 (five) minutes as needed for chest pain. Up to 3 doses 10 tablet 2  . OXYGEN Inhale 2 L  into the lungs at bedtime.    . pantoprazole (PROTONIX) 40 MG tablet TAKE 1 TABLET (40 MG TOTAL) EVERY MORNING BY MOUTH. 90 tablet 1  . traMADol (ULTRAM) 50 MG tablet TAKE 1 TABLET BY MOUTH TWICE A DAY 180 tablet 1  . traMADol (ULTRAM-ER) 200 MG 24 hr tablet Take 1 tablet (200 mg total) by mouth daily. 90 tablet 1  . vitamin B-12 1000 MCG tablet Take 1 tablet (1,000 mcg total) by mouth daily. 30 tablet 0  . warfarin (COUMADIN) 2.5 MG tablet TAKE AS DIRECTED BY COUMADIN CLINIC 90 tablet 0   No current facility-administered medications on file prior to visit.     There were no vitals taken for this visit.   Observations/Objective:   Gen: Awake, alert, no acute distress Resp: Breathing is even and non-labored Psych: calm/pleasant demeanor Neuro: Alert and Oriented x 3, speech is clear.   Assessment and Plan:  Low back pain with sciatica- will rx with medrol dose pak. Avoid nsaids due to coumadin.  (he is overdue for INR check with coumadin clinic but will schedule).  Pt is advised to continue tylenol prn. Monitor sugars while on medrol, call if sugars >300 or if symptoms fail to improve. Pt verbalizes understanding.  Follow Up Instructions:    I discussed the assessment and treatment plan with the patient. The patient was provided an opportunity to ask questions and all were answered. The patient agreed with the plan and demonstrated an understanding of the instructions.   The patient was advised to call back or seek an in-person evaluation if the symptoms worsen or if the condition fails to improve as anticipated.  I provided 8 minutes of non-face-to-face time during this encounter.   [name], NP

## 2018-10-28 NOTE — Progress Notes (Deleted)
{Choose 1 Note Type (Telehealth Visit or Telephone Visit):[id number]}   Date:  10/28/2018   ID:  [name], DOB [birth year], MRN [id number]  {Patient Location:[phone]::"Home"} {Provider [phone]::"Home"}  PCP:  Debbrah Alar, NP  Cardiologist:  Candee Furbish, MD  Electrophysiologist:  None   Evaluation Performed:  {Choose Visit [phone]::"Follow-Up Visit"}  Chief Complaint:  ***  History of Present Illness:    [name] is a 73 y.o. male with *** He has a hx of coronary artery disease diabetes with prior stroke residual left hemiplegia hyperlipidemia paroxysmal A. fib on Coumadin obstructive sleep apnea on CPAP CAD post CABG diastolic heart failure chronic who came in with chest pain and fatigue in December 2019.  Underwent cardiac catheterization which showed 3 out of 4 grafts patent chronic occlusion of SVG to left circumflex.  Medical therapy.  Mild nonspecific T wave inversions were noted in the anterolateral leads, troponins were flat.  Rhythm, on Coumadin resumed.  Lopressor was discontinued because of bradycardia.  No evidence of volume overload.  Echocardiogram on 02/18/2018 showed normal ejection fraction with grade 1 diastolic dysfunction.  The patient {does/does not:200015} have symptoms concerning for COVID-19 infection (fever, chills, cough, or new shortness of breath).    Past Medical History:  Diagnosis Date  . Acute encephalopathy 12/12/2015  . Acute respiratory failure with hypoxia and hypercapnia ([place]) 12/12/2015  . Allergic rhinitis 02/13/2010   Qualifier: Diagnosis of  By: Wynona Luna   . Angina at rest [facility]) 10/22/2015  . Ataxia 08/10/2013  . B12 deficiency 08/11/2013  . BACK PAIN, LUMBAR 01/29/2009   Qualifier: Diagnosis of  By: Wynona Luna   . BENIGN POSITIONAL VERTIGO 11/23/2009   Qualifier: Diagnosis of  By: Wynona Luna   . Chronic diastolic heart failure ([place]) 04/24/2015  . Chronic pain    left  sided-Kristeins  . Coronary 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 ([place]) 11/04/2015   s/p CABG 2004; CFX DES 2011  . Essential hypertension 12/12/2006   Qualifier: Diagnosis of  By: [name] RMA, Lucy    . GERD 12/12/2006   Qualifier: Diagnosis of  By: [name] RMA, Lucy    . GOUT 12/12/2006   Qualifier: Diagnosis of  By: Reatha Armour, Lucy    . Hyperlipidemia LDL goal <70 12/12/2006   Qualifier: Diagnosis of  By: [name] RMA, Lucy     . Hypogonadism male 04/10/2011  . Hypothyroidism, acquired 02/15/2013  . Insomnia 05/21/2013  . Long term current use of anticoagulant therapy 06/24/2011  . Obstructive sleep apnea-Failed CPAP 06/17/2007   Failed CPAP Using O 2 for sleep   . Paroxysmal atrial fibrillation ([place]) 12/06/2016  . 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 Thoracic aorta atherosclerosis ([place]) 04/27/2017  . Tubular adenoma of colon 05/2010  . Type 2 diabetes mellitus without complication, without long-term current use of insulin ([place]) 10/05/2007   Qualifier: Diagnosis of  By: Wynona Luna   . Vasovagal syncope    Past Surgical History:  Procedure Laterality Date  . CARDIAC CATHETERIZATION N/A 10/25/2015   Procedure: Left Heart Cath and Cors/Grafts Angiography;  Surgeon: Troy Sine, MD;  Location: [place] CV LAB;  Service: Cardiovascular;  Laterality: N/A;  . CARDIOVERSION  06/20/2011   Procedure: CARDIOVERSION;  Surgeon: Larey Dresser, MD;  Location: [facility] ENDOSCOPY;  Service: Cardiovascular;  Laterality: N/A;  . CARDIOVERSION N/A 04/26/2015   Procedure: CARDIOVERSION;  Surgeon:  Dorothy Spark, MD;  Location: [facility] ENDOSCOPY;  Service: Cardiovascular;  Laterality: N/A;  . CARDIOVERSION N/A 05/10/2015   Procedure: CARDIOVERSION;  Surgeon: Larey Dresser, MD;  Location: [place];  Service: Cardiovascular;  Laterality: N/A;  . CATARACT EXTRACTION  05/2010   left eye  . CATARACT EXTRACTION  04/2010   right eye  . CORONARY  ARTERY BYPASS GRAFT     stent  . ESOPHAGOGASTRODUODENOSCOPY  03-25-2005  . LEFT HEART CATH AND CORS/GRAFTS ANGIOGRAPHY N/A 02/19/2018   Procedure: LEFT HEART CATH AND CORS/GRAFTS ANGIOGRAPHY;  Surgeon: Martinique, Peter M, MD;  Location: [place] CV LAB;  Service: Cardiovascular;  Laterality: N/A;  . NASAL SEPTUM SURGERY    . PERCUTANEOUS PLACEMENT INTRAVASCULAR STENT CERVICAL CAROTID ARTERY     03-2009; using a drug-eluting platform of the circumflex cornoray artery with a 3.0 x 18 [employer] Promus drug-eluting platform post dilated to 3.75 with a noncompliant balloon.  . TEE WITHOUT CARDIOVERSION  06/20/2011   Procedure: TRANSESOPHAGEAL ECHOCARDIOGRAM (TEE);  Surgeon: Larey Dresser, MD;  Location: [facility] ENDOSCOPY;  Service: Cardiovascular;  Laterality: N/A;     No outpatient medications have been marked as taking for the 10/29/18 encounter (Appointment) with Isaiah Serge, NP.     Allergies:   Patient has no known allergies.   Social History   Tobacco Use  . Smoking status: Never Smoker  . Smokeless tobacco: Never Used  Substance Use Topics  . Alcohol use: No    Alcohol/week: 0.0 standard drinks    Comment: 1 beer a month  . Drug use: No     Family Hx: The patient's family history includes Lung cancer in his father; Stroke in his mother.  ROS:   Please see the history of present illness.    *** All other systems reviewed and are negative.   Prior CV studies:   The following studies were reviewed today:  ***  Labs/Other Tests and Data Reviewed:    EKG:  {EKG/Telemetry Strips Reviewed:[id number]}  Recent Labs: 01/27/2018: ALT 44; TSH 1.47 02/20/2018: Hemoglobin 11.9; Platelets 144 05/11/2018: BUN 27; Creatinine, Ser 1.12; Potassium 4.4; Sodium 140   Recent Lipid Panel Lab Results  Component Value Date/Time   CHOL 117 02/18/2018 02:36 AM   CHOL 138 12/05/2016 11:00 AM   TRIG 130 02/18/2018 02:36 AM   HDL 34 (L) 02/18/2018 02:36 AM   HDL 46 12/05/2016 11:00 AM    CHOLHDL 3.4 02/18/2018 02:36 AM   LDLCALC 57 02/18/2018 02:36 AM   LDLCALC 67 12/05/2016 11:00 AM    Wt Readings from Last 3 Encounters:  09/02/18 255 lb (115.7 kg)  06/22/18 260 lb (117.9 kg)  05/28/18 260 lb (117.9 kg)     Objective:    Vital Signs:  There were no vitals taken for this visit.   {HeartCare Virtual Exam (Optional):[phone]::"VITAL SIGNS:  reviewed"}  ASSESSMENT & PLAN:    1. ***  COVID-19 Education: The signs and symptoms of COVID-19 were discussed with the patient and how to seek care for testing (follow up with PCP or arrange E-visit).  ***The importance of social distancing was discussed today.  Time:   Today, I have spent *** minutes with the patient with telehealth technology discussing the above problems.     Medication Adjustments/Labs and Tests Ordered: Current medicines are reviewed at length with the patient today.  Concerns regarding medicines are outlined above.   Tests Ordered: No orders of the defined types were placed in this encounter.  Medication Changes: No orders of the defined types were placed in this encounter.   Follow Up:  {F/U Format:[phone]} {follow up:15908}  Signed, Cecilie Kicks, NP  10/28/2018 10:05 PM    [facility]

## 2018-10-29 ENCOUNTER — Other Ambulatory Visit: Payer: Self-pay

## 2018-10-29 ENCOUNTER — Telehealth: Payer: Self-pay | Admitting: Family

## 2018-10-29 ENCOUNTER — Ambulatory Visit: Payer: Self-pay | Admitting: *Deleted

## 2018-10-29 ENCOUNTER — Ambulatory Visit (INDEPENDENT_AMBULATORY_CARE_PROVIDER_SITE_OTHER): Payer: Medicare Other | Admitting: *Deleted

## 2018-10-29 ENCOUNTER — Ambulatory Visit: Payer: Medicare Other | Admitting: Physical Medicine & Rehabilitation

## 2018-10-29 ENCOUNTER — Ambulatory Visit: Payer: Medicare Other | Admitting: Cardiology

## 2018-10-29 DIAGNOSIS — I4891 Unspecified atrial fibrillation: Secondary | ICD-10-CM | POA: Diagnosis not present

## 2018-10-29 DIAGNOSIS — Z7901 Long term (current) use of anticoagulants: Secondary | ICD-10-CM

## 2018-10-29 LAB — POCT INR: INR: 3.8 — AB (ref 2.0–3.0)

## 2018-10-29 NOTE — Telephone Encounter (Signed)
Message from Luciana Axe sent at 10/29/2018 5:43 PM EDT  Patient is calling because he was given a steroid for his sciatica last week. It did not help and he would like to know what else can he take. Can he take Tizanidine?    Pt calling with complaints of lower back pain the he currently rates at 8 for the past 2 weeks. Pt states he is taking Tramadol-2 50 mg tabs and one 200 mg a day. Pt asking if he could take his wife's prescription of Tizanidine. Explained to pt that is would not be advised to take a medication that was not prescribed to him or approved by his PCP. Explained to pt that he could try over the counter medication such as Tylenol to help with pain in between taking Tramadol. Pt advised to seek treatment in the ED/Urgent care for sever or worsening pain. Pt verbalized understanding. Also see telephone encounter from 10/29/18 where pt called in to request a different medication.   Reason for Disposition . [1] SEVERE back pain (e.g., excruciating, unable to do any normal activities) AND [2] not improved 2 hours after pain medicine  Answer Assessment - Initial Assessment Questions 1. ONSET: "When did the pain begin?"      About 2 weeks 2. LOCATION: "Where does it hurt?" (upper, mid or lower back)     lower 3. SEVERITY: "How bad is the pain?"  (e.g., Scale 1-10; mild, moderate, or severe)   - MILD (1-3): doesn't interfere with normal activities    - MODERATE (4-7): interferes with normal activities or awakens from sleep    - SEVERE (8-10): excruciating pain, unable to do any normal activities      8 4. PATTERN: "Is the pain constant?" (e.g., yes, no; constant, intermittent)      Pretty dominant, consistant 5. RADIATION: "Does the pain shoot into your legs or elsewhere?"     Radiation not verbalized 6. CAUSE:  "What do you think is causing the back pain?"      Chronic pain 7. BACK OVERUSE:  "Any recent lifting of heavy objects, strenuous work or exercise?"     Not  assessed 8. MEDICATIONS: "What have you taken so far for the pain?" (e.g., nothing, acetaminophen, NSAIDS)  pt takes tramadol daily 9. NEUROLOGIC SYMPTOMS: "Do you have any weakness, numbness, or problems with bowel/bladder control?"     no 10. OTHER SYMPTOMS: "Do you have any other symptoms?" (e.g., fever, abdominal pain, burning with urination, blood in urine)      Not assessed 11. PREGNANCY: "Is there any chance you are pregnant?" (e.g., yes, no; LMP) n/a  Protocols used: BACK PAIN-A-AH

## 2018-10-29 NOTE — Telephone Encounter (Signed)
Pt says medication helped for a little while, but stopped helping.   He is asking for something different. [facility] [address]

## 2018-10-29 NOTE — Patient Instructions (Signed)
Description   Hold today, then continue taking 1 tablet everyday except 1/2 tablet on Sundays, Tuesdays and Thursdays. Recheck INR 2 weeks. Main [phone] Coumadin clinic [phone]

## 2018-10-31 MED ORDER — PREDNISONE 10 MG PO TABS: ORAL_TABLET | ORAL | 0 refills | Status: DC

## 2018-10-31 NOTE — Telephone Encounter (Signed)
Spoke to pt. Reports pain "excruciating" into his right buttock. Some improvement with tylenol. Tramadol helps some too. Also, pt reports that he had some improvement first few days on medrol dose pack. Advised pt that I will repeat steroid taper with prednisone, he should take tylenol scheduled every 6 hrs and call if he does not have improvement. Pt verbalizes understanding.

## 2018-10-31 NOTE — Telephone Encounter (Signed)
Reviewed- see phone note.

## 2018-11-01 ENCOUNTER — Other Ambulatory Visit: Payer: Self-pay | Admitting: Family

## 2018-11-09 NOTE — Progress Notes (Deleted)
Cardiology Office Note   Date:  11/09/2018   ID:  Kyndal Gloster, DOB [birth year], MRN [id number]  PCP:  Debbrah Alar, NP  Cardiologist:  Dr. Marlou Porch    No chief complaint on file.     History of Present Illness: [name] is a 73 y.o. male who presents for ***  with coronary artery disease diabetes with prior stroke residual left hemiplegia hyperlipidemia paroxysmal A. fib on Coumadin obstructive sleep apnea on CPAP CAD post CABG diastolic heart failure chronic who came in with chest pain and fatigue in December 2019.  Underwent cardiac catheterization which showed 3 out of 4 grafts patent chronic occlusion of SVG to left circumflex.  Medical therapy.  Mild nonspecific T wave inversions were noted in the anterolateral leads, troponins were flat.  Rhythm, on Coumadin resumed.  Lopressor was discontinued because of bradycardia.  No evidence of volume overload.  Echocardiogram on 02/18/2018 showed normal ejection fraction with grade 1 diastolic dysfunction.  CAD stable PAF on coumadin  Chronic diastolic HF   Past Medical History:  Diagnosis Date  . Acute encephalopathy 12/12/2015  . Acute respiratory failure with hypoxia and hypercapnia ([place]) 12/12/2015  . Allergic rhinitis 02/13/2010   Qualifier: Diagnosis of  By: Wynona Luna   . Angina at rest [facility]) 10/22/2015  . Ataxia 08/10/2013  . B12 deficiency 08/11/2013  . BACK PAIN, LUMBAR 01/29/2009   Qualifier: Diagnosis of  By: Wynona Luna   . BENIGN POSITIONAL VERTIGO 11/23/2009   Qualifier: Diagnosis of  By: Wynona Luna   . Chronic diastolic heart failure ([place]) 04/24/2015  . Chronic pain    left sided-Kristeins  . Coronary 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 ([place]) 11/04/2015   s/p CABG 2004; CFX DES 2011  . Essential hypertension 12/12/2006   Qualifier: Diagnosis of  By: [name] RMA, Lucy    . GERD 12/12/2006   Qualifier: Diagnosis of  By: [name] RMA, Lucy    . GOUT 12/12/2006   Qualifier: Diagnosis of  By: Reatha Armour, Lucy    . Hyperlipidemia LDL goal <70 12/12/2006   Qualifier: Diagnosis of  By: [name] RMA, Lucy     . Hypogonadism male 04/10/2011  . Hypothyroidism, acquired 02/15/2013  . Insomnia 05/21/2013  . Long term current use of anticoagulant therapy 06/24/2011  . Obstructive sleep apnea-Failed CPAP 06/17/2007   Failed CPAP Using O 2 for sleep   . Paroxysmal atrial fibrillation ([place]) 12/06/2016  . Stroke (cerebrum) ([place]) 01/13/2017   also 1996; resultant hemiplegia of dominant side  . Thoracic aorta atherosclerosis ([place]) 04/27/2017  . Tubular adenoma of colon 05/2010  . Type 2 diabetes mellitus without complication, without long-term current use of insulin ([place]) 10/05/2007   Qualifier: Diagnosis of  By: Wynona Luna   . Vasovagal syncope     Past Surgical History:  Procedure Laterality Date  . CARDIAC CATHETERIZATION N/A 10/25/2015   Procedure: Left Heart Cath and Cors/Grafts Angiography;  Surgeon: Troy Sine, MD;  Location: [place] CV LAB;  Service: Cardiovascular;  Laterality: N/A;  . CARDIOVERSION  06/20/2011   Procedure: CARDIOVERSION;  Surgeon: Larey Dresser, MD;  Location: [facility] ENDOSCOPY;  Service: Cardiovascular;  Laterality: N/A;  . CARDIOVERSION N/A 04/26/2015   Procedure: CARDIOVERSION;  Surgeon: Dorothy Spark, MD;  Location: [facility] ENDOSCOPY;  Service: Cardiovascular;  Laterality: N/A;  . CARDIOVERSION N/A 05/10/2015   Procedure: CARDIOVERSION;  Surgeon: Larey Dresser, MD;  Location: [facility]  ENDOSCOPY;  Service: Cardiovascular;  Laterality: N/A;  . CATARACT EXTRACTION  05/2010   left eye  . CATARACT EXTRACTION  04/2010   right eye  . CORONARY ARTERY BYPASS GRAFT     stent  . ESOPHAGOGASTRODUODENOSCOPY  03-25-2005  . LEFT HEART CATH AND CORS/GRAFTS ANGIOGRAPHY N/A 02/19/2018   Procedure: LEFT HEART CATH AND CORS/GRAFTS ANGIOGRAPHY;  Surgeon: Martinique, Peter M, MD;  Location: [place] CV LAB;  Service: Cardiovascular;  Laterality: N/A;  . NASAL  SEPTUM SURGERY    . PERCUTANEOUS PLACEMENT INTRAVASCULAR STENT CERVICAL CAROTID ARTERY     03-2009; using a drug-eluting platform of the circumflex cornoray artery with a 3.0 x 18 [employer] Promus drug-eluting platform post dilated to 3.75 with a noncompliant balloon.  . TEE WITHOUT CARDIOVERSION  06/20/2011   Procedure: TRANSESOPHAGEAL ECHOCARDIOGRAM (TEE);  Surgeon: Larey Dresser, MD;  Location: [facility] ENDOSCOPY;  Service: Cardiovascular;  Laterality: N/A;     Current Outpatient Medications  Medication Sig Dispense Refill  . ACCU-CHEK GUIDE test strip USE UP TO FOUR TIMES DAILY AS DIRECTED 100 strip 0  . allopurinol (ZYLOPRIM) 100 MG tablet TAKE 2 TABLETS BY MOUTH EVERY DAY 180 tablet 1  . atorvastatin (LIPITOR) 40 MG tablet Take 1 tablet (40 mg total) by mouth daily. 90 tablet 3  . benazepril (LOTENSIN) 10 MG tablet TAKE 1 TABLET BY MOUTH EVERY DAY 90 tablet 3  . benazepril (LOTENSIN) 5 MG tablet TAKE 1 TABLET BY MOUTH DAILY WITH 10MG OF BENAZEPRIL TO EQUAL 15MG DAILY 90 tablet 2  . blood glucose meter kit and supplies KIT Dispense based on patient and insurance preference. Use up to four times daily as directed. (FOR ICD-9 250.00, 250.01). 1 each 0  . fluocinonide gel (LIDEX) 0.05 % Apply to scalp once daily as needed 60 g 0  . fluticasone (FLONASE) 50 MCG/ACT nasal spray Place 2 sprays into both nostrils daily. 9.9 g 1  . furosemide (LASIX) 40 MG tablet TAKE 1 TABLET BY MOUTH EVERY OTHER DAY 45 tablet 1  . isosorbide mononitrate (IMDUR) 30 MG 24 hr tablet TAKE 1/2 TABLET BY MOUTH EVERY DAY 45 tablet 3  . levothyroxine (SYNTHROID, LEVOTHROID) 112 MCG tablet TAKE 1 TABLET (112 MCG TOTAL) BY MOUTH DAILY BEFORE BREAKFAST. 90 tablet 1  . meclizine (ANTIVERT) 25 MG tablet Take 1 tablet (25 mg total) by mouth 3 (three) times daily as needed for dizziness. 30 tablet 0  . metFORMIN (GLUCOPHAGE) 500 MG tablet TAKE 1 TABLET (500 MG TOTAL) DAILY WITH BREAKFAST BY MOUTH. 90 tablet 1  . Multiple  Vitamins-Minerals (MULTIVITAMIN GUMMIES MENS PO) Take 1 tablet by mouth daily with breakfast.     . nitroGLYCERIN (NITROSTAT) 0.4 MG SL tablet Place 1 tablet (0.4 mg total) under the tongue every 5 (five) minutes as needed for chest pain. Up to 3 doses 10 tablet 2  . OXYGEN Inhale 2 L into the lungs at bedtime.    . pantoprazole (PROTONIX) 40 MG tablet TAKE 1 TABLET (40 MG TOTAL) EVERY MORNING BY MOUTH. 90 tablet 1  . predniSONE (DELTASONE) 10 MG tablet 4 tabs by mouth once daily for 2 days, then 3 tabs daily x 2 days, then 2 tabs daily x 2 days, then 1 tab daily x 2 days 20 tablet 0  . traMADol (ULTRAM) 50 MG tablet TAKE 1 TABLET BY MOUTH TWICE A DAY 180 tablet 1  . traMADol (ULTRAM-ER) 200 MG 24 hr tablet Take 1 tablet (200 mg total) by mouth daily. Noank  tablet 1  . vitamin B-12 1000 MCG tablet Take 1 tablet (1,000 mcg total) by mouth daily. 30 tablet 0  . warfarin (COUMADIN) 2.5 MG tablet TAKE AS DIRECTED BY COUMADIN CLINIC 90 tablet 0   No current facility-administered medications for this visit.     Allergies:   Patient has no known allergies.    Social History:  The patient  reports that he has never smoked. He has never used smokeless tobacco. He reports that he does not drink alcohol or use drugs.   Family History:  The patient's ***family history includes Lung cancer in his father; Stroke in his mother.    ROS:  General:no colds or fevers, no weight changes Skin:no rashes or ulcers HEENT:no blurred vision, no congestion CV:see HPI PUL:see HPI GI:no diarrhea constipation or melena, no indigestion GU:no hematuria, no dysuria MS:no joint pain, no claudication Neuro:no syncope, no lightheadedness Endo:no diabetes, no thyroid disease Wt Readings from Last 3 Encounters:  09/02/18 255 lb (115.7 kg)  06/22/18 260 lb (117.9 kg)  05/28/18 260 lb (117.9 kg)     PHYSICAL EXAM: VS:  There were no vitals taken for this visit. , BMI There is no height or weight on file to calculate BMI.  General:Pleasant affect, NAD Skin:Warm and dry, brisk capillary refill HEENT:normocephalic, sclera clear, mucus membranes moist Neck:supple, no JVD, no bruits  Heart:S1S2 RRR without murmur, gallup, rub or click Lungs:clear without rales, rhonchi, or wheezes IWP:YKDX, non tender, + BS, do not palpate liver spleen or masses Ext:no lower ext edema, 2+ pedal pulses, 2+ radial pulses Neuro:alert and oriented, MAE, follows commands, + facial symmetry    EKG:  EKG is ordered today. The ekg ordered today demonstrates ***   Recent Labs: 01/27/2018: ALT 44; TSH 1.47 02/20/2018: Hemoglobin 11.9; Platelets 144 05/11/2018: BUN 27; Creatinine, Ser 1.12; Potassium 4.4; Sodium 140    Lipid Panel    Component Value Date/Time   CHOL 117 02/18/2018 0236   CHOL 138 12/05/2016 1100   TRIG 130 02/18/2018 0236   HDL 34 (L) 02/18/2018 0236   HDL 46 12/05/2016 1100   CHOLHDL 3.4 02/18/2018 0236   VLDL 26 02/18/2018 0236   LDLCALC 57 02/18/2018 0236   LDLCALC 67 12/05/2016 1100       Other studies Reviewed: Additional studies/ records that were reviewed today include: ***.   ASSESSMENT AND PLAN:  1.  ***   Current medicines are reviewed with the patient today.  The patient Has no concerns regarding medicines.  The following changes have been made:  See above Labs/ tests ordered today include:see above  Disposition:   FU:  see above  Signed, Cecilie Kicks, NP  11/09/2018 1:52 PM    [facility] [place], [place], [place], [place] Phone: [phone]; Fax: [phone]

## 2018-11-10 ENCOUNTER — Other Ambulatory Visit: Payer: Self-pay

## 2018-11-10 ENCOUNTER — Encounter: Payer: Medicare Other | Attending: Physical Medicine & Rehabilitation | Admitting: Physical Medicine & Rehabilitation

## 2018-11-10 ENCOUNTER — Encounter: Payer: Self-pay | Admitting: Physical Medicine & Rehabilitation

## 2018-11-10 VITALS — BP 126/89 | HR 79 | Temp 98.3°F | Ht 75.0 in | Wt 255.0 lb

## 2018-11-10 DIAGNOSIS — Z79891 Long term (current) use of opiate analgesic: Secondary | ICD-10-CM

## 2018-11-10 DIAGNOSIS — M259 Joint disorder, unspecified: Secondary | ICD-10-CM | POA: Diagnosis not present

## 2018-11-10 DIAGNOSIS — Z5181 Encounter for therapeutic drug level monitoring: Secondary | ICD-10-CM

## 2018-11-10 DIAGNOSIS — G894 Chronic pain syndrome: Secondary | ICD-10-CM | POA: Diagnosis not present

## 2018-11-10 DIAGNOSIS — G8111 Spastic hemiplegia affecting right dominant side: Secondary | ICD-10-CM | POA: Diagnosis not present

## 2018-11-10 DIAGNOSIS — G8112 Spastic hemiplegia affecting left dominant side: Secondary | ICD-10-CM

## 2018-11-10 NOTE — Progress Notes (Signed)
Subjective:    Patient ID: [name], male    DOB: Oct 31, 1945, 73 y.o.   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  He has remained modified independent in terms of his function however he has had increasing right-sided low back pain over the last month or so.  Prior to that he had an exacerbation of his low back pain in April 2020 and prior to that he had an exacerbation in 2017.  Reviewing his MRI there is evidence of lumbar spinal stenosis affecting multiple levels as well as multilevel lumbar facet arthropathy. The patient indicates that his pain is below the belt and is on the right side.  He has no pain radiating down his lower extremity or into his buttock no hip pain.  No numbness or tingling in the right foot.  No progressive weakness no new bowel or bladder dysfunction. Pain Inventory Average Pain 4 Pain Right Now 4 My pain is sharp, burning and aching  In the last 24 hours, has pain interfered with the following? General activity 4 Relation with others 4 Enjoyment of life 4 What TIME of day is your pain at its worst? morning and night Sleep (in general) Poor  Pain is worse with: walking, bending and standing Pain improves with: medication and injections Relief from Meds: na  Mobility use a walker  Function retired I need assistance with the following:  meal prep, household duties and shopping  Neuro/Psych bladder control problems  Prior Studies Any changes since last visit?  yes sciatic pain  Physicians involved in your care Any changes since last visit?  no   Family History  Problem Relation Age of Onset  . Lung cancer Father        deceased  . Stroke Mother        deceased-MINISTROKES   Social History   Socioeconomic History  . Marital status: Married    Spouse name: [name]  . Number of children: Not on file  . Years of education: 43  . Highest education level: Not on  file  Occupational History  . 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 Financial resource strain: Not on file  . Food insecurity    Worry: Not on file    Inability: Not on file  . Transportation needs    Medical: Not on file    Non-medical: Not on file  Tobacco Use  . Smoking status: Never Smoker  . Smokeless tobacco: Never Used  Substance and Sexual Activity  . Alcohol use: No    Alcohol/week: 0.0 standard drinks    Comment: 1 beer a month  . Drug use: No  . Sexual activity: Not Currently  Lifestyle  . Physical activity    Days per week: Not on file    Minutes per session: Not on file  . Stress: Not on file  Relationships  . Social Herbalist on phone: Not on file    Gets together: Not on file    Attends religious service: Not on file    Active member of club or organization: Not on file    Attends meetings of clubs or organizations: Not on file    Relationship status: Not on file  Other Topics Concern  . Not on file  Social History Narrative   Pt lives with wife. Does have stairs, but patient doesn't  use them. Pt has completed technical school   Past Surgical History:  Procedure Laterality Date  . CARDIAC CATHETERIZATION N/A 10/25/2015   Procedure: Left Heart Cath and Cors/Grafts Angiography;  Surgeon: Troy Sine, MD;  Location: [place] CV LAB;  Service: Cardiovascular;  Laterality: N/A;  . CARDIOVERSION  06/20/2011   Procedure: CARDIOVERSION;  Surgeon: Larey Dresser, MD;  Location: [facility] ENDOSCOPY;  Service: Cardiovascular;  Laterality: N/A;  . CARDIOVERSION N/A 04/26/2015   Procedure: CARDIOVERSION;  Surgeon: Dorothy Spark, MD;  Location: [facility] ENDOSCOPY;  Service: Cardiovascular;  Laterality: N/A;  . CARDIOVERSION N/A 05/10/2015   Procedure: CARDIOVERSION;  Surgeon: Larey Dresser, MD;  Location: [place];  Service: Cardiovascular;  Laterality: N/A;  . CATARACT EXTRACTION  05/2010   left eye  .  CATARACT EXTRACTION  04/2010   right eye  . CORONARY ARTERY BYPASS GRAFT     stent  . ESOPHAGOGASTRODUODENOSCOPY  03-25-2005  . LEFT HEART CATH AND CORS/GRAFTS ANGIOGRAPHY N/A 02/19/2018   Procedure: LEFT HEART CATH AND CORS/GRAFTS ANGIOGRAPHY;  Surgeon: Martinique, Peter M, MD;  Location: [place] CV LAB;  Service: Cardiovascular;  Laterality: N/A;  . NASAL SEPTUM SURGERY    . PERCUTANEOUS PLACEMENT INTRAVASCULAR STENT CERVICAL CAROTID ARTERY     03-2009; using a drug-eluting platform of the circumflex cornoray artery with a 3.0 x 18 [employer] Promus drug-eluting platform post dilated to 3.75 with a noncompliant balloon.  . TEE WITHOUT CARDIOVERSION  06/20/2011   Procedure: TRANSESOPHAGEAL ECHOCARDIOGRAM (TEE);  Surgeon: Larey Dresser, MD;  Location: [place];  Service: Cardiovascular;  Laterality: N/A;   Past Medical History:  Diagnosis Date  . Acute encephalopathy 12/12/2015  . Acute respiratory failure with hypoxia and hypercapnia ([place]) 12/12/2015  . Allergic rhinitis 02/13/2010   Qualifier: Diagnosis of  By: Wynona Luna   . Angina at rest [facility]) 10/22/2015  . Ataxia 08/10/2013  . B12 deficiency 08/11/2013  . BACK PAIN, LUMBAR 01/29/2009   Qualifier: Diagnosis of  By: Wynona Luna   . BENIGN POSITIONAL VERTIGO 11/23/2009   Qualifier: Diagnosis of  By: Wynona Luna   . Chronic diastolic heart failure ([place]) 04/24/2015  . Chronic pain    left sided-Kristeins  . Coronary 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 ([place]) 11/04/2015   s/p CABG 2004; CFX DES 2011  . Essential hypertension 12/12/2006   Qualifier: Diagnosis of  By: [name] RMA, Lucy    . GERD 12/12/2006   Qualifier: Diagnosis of  By: [name] RMA, Lucy    . GOUT 12/12/2006   Qualifier: Diagnosis of  By: Reatha Armour, Lucy    . Hyperlipidemia LDL goal <70 12/12/2006   Qualifier: Diagnosis of  By: [name] RMA, Lucy     . Hypogonadism male 04/10/2011  . Hypothyroidism, acquired 02/15/2013  . Insomnia  05/21/2013  . Long term current use of anticoagulant therapy 06/24/2011  . Obstructive sleep apnea-Failed CPAP 06/17/2007   Failed CPAP Using O 2 for sleep   . Paroxysmal atrial fibrillation ([place]) 12/06/2016  . Stroke (cerebrum) ([place]) 01/13/2017   also 1996; resultant hemiplegia of dominant side  . Thoracic aorta atherosclerosis ([place]) 04/27/2017  . Tubular adenoma of colon 05/2010  . Type 2 diabetes mellitus without complication, without long-term current use of insulin ([place]) 10/05/2007   Qualifier: Diagnosis of  By: Wynona Luna   . Vasovagal syncope    BP 126/89   Pulse 79   Temp 98.3 F (  36.8 C)   Ht 6\' 3"  (1.905 m) Comment: last reported  Wt 255 lb (115.7 kg) Comment: last reported  SpO2 93%   BMI 31.87 kg/m   Opioid Risk Score:   Fall Risk Score:  `1  Depression screen PHQ 2/9  Depression screen [facility] 2/9 11/10/2018 07/28/2017 07/13/2017 05/21/2016  Decreased Interest 0 0 0 0  Down, Depressed, Hopeless 0 0 0 0  PHQ - 2 Score 0 0 0 0  Altered sleeping - - - 1  Tired, decreased energy - - - 1  Change in appetite - - - 0  Feeling bad or failure about yourself  - - - 0  Trouble concentrating - - - 0  Moving slowly or fidgety/restless - - - 0  Suicidal thoughts - - - 0  PHQ-9 Score - - - 2  Some recent data might be hidden   Review of Systems  Constitutional: Negative.   HENT: Negative.   Eyes: Negative.   Respiratory: Positive for shortness of breath and wheezing.   Cardiovascular: Negative.   Gastrointestinal: Negative.   Endocrine:       High blood sugars  Genitourinary:       Bladder control worsening  Musculoskeletal: Negative.   Skin: Negative.   Allergic/Immunologic: Negative.   Neurological: Negative.   Hematological: Bruises/bleeds easily.       Coumadin  Psychiatric/Behavioral: Negative.   All other systems reviewed and are negative.      Objective:   Physical Exam Vitals signs and nursing note reviewed.  Constitutional:      Appearance: Normal  appearance.  Eyes:     Extraocular Movements: Extraocular movements intact.     Conjunctiva/sclera: Conjunctivae normal.     Pupils: Pupils are equal, round, and reactive to light.  Skin:    General: Skin is warm and dry.  Neurological:     General: No focal deficit present.     Mental Status: He is alert and oriented to person, place, and time. Mental status is at baseline.  Psychiatric:        Mood and Affect: Mood normal.        Behavior: Behavior normal.     Right PSIS tenderness Bilateral hip range of motion is normal with internal/external rotation. Negative straight leg raising Sensation normal in the right lower extremity Motor strength is 4- in the left hip flexor knee extensor 3- ankle dorsiflexor 5/5 in the right hip flexor knee extensor ankle dorsiflexor Gait the patient does have a stiff leg gait gait on the left side.  He has decreased stance phase in the left lower limb as well as a shorter swing phase.  Speech without dysarthria or aphasia.      Assessment & Plan:  1.  Right buttocks pain with exam correlating to PSIS area.  We discussed that while he does have significant spondylosis in the back he really does not have any pain in the area that corresponds to his facet arthropathy. In addition he does not have any signs or symptoms of neurogenic claudication or of lumbar radiculopathy in the right lower limb. 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 We will schedule for right sacroiliac injection.  He does not need to stop his Coumadin for this. I discussed with the patient as well as his wife who is in attendance today.  We discussed the use of a heating pad.  He is using a counter irritant cream.  He is also using Tylenol.  We discussed other pain medications which he declines at this time.

## 2018-11-10 NOTE — Patient Instructions (Signed)
Heating pad 20-30 min at a time

## 2018-11-11 ENCOUNTER — Ambulatory Visit: Payer: Medicare Other | Admitting: Cardiology

## 2018-11-12 ENCOUNTER — Encounter: Payer: Self-pay | Admitting: Physical Medicine & Rehabilitation

## 2018-11-12 ENCOUNTER — Encounter (HOSPITAL_BASED_OUTPATIENT_CLINIC_OR_DEPARTMENT_OTHER): Payer: Medicare Other | Admitting: Physical Medicine & Rehabilitation

## 2018-11-12 ENCOUNTER — Other Ambulatory Visit: Payer: Self-pay

## 2018-11-12 VITALS — BP 154/93 | HR 75 | Temp 98.7°F | Ht 75.0 in | Wt 255.0 lb

## 2018-11-12 DIAGNOSIS — M533 Sacrococcygeal disorders, not elsewhere classified: Secondary | ICD-10-CM | POA: Diagnosis not present

## 2018-11-12 DIAGNOSIS — G8111 Spastic hemiplegia affecting right dominant side: Secondary | ICD-10-CM | POA: Diagnosis not present

## 2018-11-12 NOTE — Patient Instructions (Signed)
Sacroiliac injection was performed today. A combination of a naming medicine plus a cortisone medicine was injected. The injection was done under x-ray guidance. This procedure has been performed to help reduce low back and buttocks pain as well as potentially hip pain. The duration of this injection is variable lasting from hours to  Months. It may repeated if needed. 

## 2018-11-12 NOTE — Progress Notes (Signed)
  PROCEDURE RECORD [facility]   Name: [name] DOB:January 15, 1946 MRN: [id number]  Date:11/12/2018  Physician: Alysia Penna, MD    Nurse/CMA: Dalessandro Baldyga CMA  Allergies: No Known Allergies  Consent Signed: Yes.    Is patient diabetic? No.  CBG today? NA Pregnant: No. LMP: No LMP for male patient. (age 75-55)  Anticoagulants: yes (coumadian) Anti-inflammatory: no Antibiotics: no  Procedure: Right Sacroiliac injection Position: Prone   Start Time: 352pm End Time: 355pm Fluoro Time: 15s  RN/CMA Shephanie Romas CMA Yamira Papa CMA    Time 325pm 403pm    BP 154/93 145/99    Pulse 75 88    Respirations 16 16    O2 Sat 97 16    S/S 6 6    Pain Level 8/10 2/10     D/C home with Wife, patient A & O X 3, D/C instructions reviewed, and sits independently.

## 2018-11-12 NOTE — Progress Notes (Signed)
Right sacroiliac injection under fluoroscopic guidance  Indication: Right Low back and buttocks pain not relieved by medication management and other conservative care.  Informed consent was obtained after describing risks and benefits of the procedure with the patient, this includes bleeding, bruising, infection, paralysis and medication side effects. The patient wishes to proceed and has given written consent. The patient was placed in a prone position. The lumbar and sacral area was marked and prepped with Betadine. A 25-gauge 1-1/2 inch needle was inserted into the skin and subcutaneous tissue and 1 mL of 1% lidocaine was injected. Then a 25-gauge 3 inch spinal needle was inserted under fluoroscopic guidance into the Right sacroiliac joint. AP and lateral images were utilized. Omnipaque 180x0.5 mL under live fluoroscopy demonstrated no intravascular uptake. Then a solution containing one ML of 6 mgper ml betamethasone and 2 ML of 1% lidocaine MPF was injected x1.5 mL. Patient tolerated the procedure well. Post procedure instructions were given. Please see post procedure form. 

## 2018-11-14 LAB — DRUG TOX MONITOR 1 W/CONF, ORAL FLD

## 2018-11-14 LAB — DRUG TOX ALC METAB W/CON, ORAL FLD: Alcohol Metabolite: NEGATIVE ng/mL (ref <25)

## 2018-11-15 ENCOUNTER — Telehealth: Payer: Self-pay | Admitting: *Deleted

## 2018-11-15 NOTE — Telephone Encounter (Signed)
Oral swab drug screen was consistent for prescribed medications.  ?

## 2018-11-16 DIAGNOSIS — I4891 Unspecified atrial fibrillation: Secondary | ICD-10-CM

## 2018-11-16 HISTORY — DX: Unspecified atrial fibrillation: I48.91

## 2018-11-27 ENCOUNTER — Encounter (HOSPITAL_COMMUNITY): Payer: Self-pay | Admitting: Emergency Medicine

## 2018-11-27 ENCOUNTER — Emergency Department (HOSPITAL_COMMUNITY): Payer: Medicare Other

## 2018-11-27 ENCOUNTER — Inpatient Hospital Stay (HOSPITAL_COMMUNITY)
Admission: EM | Admit: 2018-11-27 | Discharge: 2018-11-29 | DRG: 309 | Disposition: A | Discharge status: Home / Self Care | Payer: Medicare Other | Attending: Internal Medicine | Admitting: Internal Medicine

## 2018-11-27 ENCOUNTER — Other Ambulatory Visit: Payer: Self-pay

## 2018-11-27 DIAGNOSIS — Z7984 Long term (current) use of oral hypoglycemic drugs: Secondary | ICD-10-CM | POA: Diagnosis not present

## 2018-11-27 DIAGNOSIS — R0602 Shortness of breath: Secondary | ICD-10-CM | POA: Diagnosis not present

## 2018-11-27 DIAGNOSIS — I480 Paroxysmal atrial fibrillation: Secondary | ICD-10-CM | POA: Diagnosis not present

## 2018-11-27 DIAGNOSIS — I25810 Atherosclerosis of coronary artery bypass graft(s) without angina pectoris: Secondary | ICD-10-CM | POA: Diagnosis not present

## 2018-11-27 DIAGNOSIS — I509 Heart failure, unspecified: Secondary | ICD-10-CM

## 2018-11-27 DIAGNOSIS — K219 Gastro-esophageal reflux disease without esophagitis: Secondary | ICD-10-CM | POA: Diagnosis present

## 2018-11-27 DIAGNOSIS — E7800 Pure hypercholesterolemia, unspecified: Secondary | ICD-10-CM | POA: Diagnosis present

## 2018-11-27 DIAGNOSIS — Z955 Presence of coronary angioplasty implant and graft: Secondary | ICD-10-CM

## 2018-11-27 DIAGNOSIS — Z8639 Personal history of other endocrine, nutritional and metabolic disease: Secondary | ICD-10-CM

## 2018-11-27 DIAGNOSIS — M7989 Other specified soft tissue disorders: Secondary | ICD-10-CM | POA: Diagnosis not present

## 2018-11-27 DIAGNOSIS — E039 Hypothyroidism, unspecified: Secondary | ICD-10-CM | POA: Diagnosis present

## 2018-11-27 DIAGNOSIS — G4733 Obstructive sleep apnea (adult) (pediatric): Secondary | ICD-10-CM | POA: Diagnosis not present

## 2018-11-27 DIAGNOSIS — Z23 Encounter for immunization: Secondary | ICD-10-CM | POA: Diagnosis not present

## 2018-11-27 DIAGNOSIS — Z79891 Long term (current) use of opiate analgesic: Secondary | ICD-10-CM | POA: Diagnosis not present

## 2018-11-27 DIAGNOSIS — N179 Acute kidney failure, unspecified: Secondary | ICD-10-CM

## 2018-11-27 DIAGNOSIS — E119 Type 2 diabetes mellitus without complications: Secondary | ICD-10-CM | POA: Diagnosis present

## 2018-11-27 DIAGNOSIS — Z79899 Other long term (current) drug therapy: Secondary | ICD-10-CM

## 2018-11-27 DIAGNOSIS — Z8739 Personal history of other diseases of the musculoskeletal system and connective tissue: Secondary | ICD-10-CM | POA: Diagnosis not present

## 2018-11-27 DIAGNOSIS — Z79890 Hormone replacement therapy (postmenopausal): Secondary | ICD-10-CM

## 2018-11-27 DIAGNOSIS — Z823 Family history of stroke: Secondary | ICD-10-CM

## 2018-11-27 DIAGNOSIS — I110 Hypertensive heart disease with heart failure: Secondary | ICD-10-CM | POA: Diagnosis present

## 2018-11-27 DIAGNOSIS — I5032 Chronic diastolic (congestive) heart failure: Secondary | ICD-10-CM | POA: Diagnosis present

## 2018-11-27 DIAGNOSIS — E669 Obesity, unspecified: Secondary | ICD-10-CM | POA: Diagnosis present

## 2018-11-27 DIAGNOSIS — I25119 Atherosclerotic heart disease of native coronary artery with unspecified angina pectoris: Secondary | ICD-10-CM | POA: Diagnosis present

## 2018-11-27 DIAGNOSIS — I4819 Other persistent atrial fibrillation: Principal | ICD-10-CM | POA: Diagnosis present

## 2018-11-27 DIAGNOSIS — Z9841 Cataract extraction status, right eye: Secondary | ICD-10-CM

## 2018-11-27 DIAGNOSIS — Z6831 Body mass index (BMI) 31.0-31.9, adult: Secondary | ICD-10-CM

## 2018-11-27 DIAGNOSIS — Z951 Presence of aortocoronary bypass graft: Secondary | ICD-10-CM

## 2018-11-27 DIAGNOSIS — G8929 Other chronic pain: Secondary | ICD-10-CM | POA: Diagnosis present

## 2018-11-27 DIAGNOSIS — J986 Disorders of diaphragm: Secondary | ICD-10-CM | POA: Diagnosis not present

## 2018-11-27 DIAGNOSIS — I69354 Hemiplegia and hemiparesis following cerebral infarction affecting left non-dominant side: Secondary | ICD-10-CM

## 2018-11-27 DIAGNOSIS — E1169 Type 2 diabetes mellitus with other specified complication: Secondary | ICD-10-CM | POA: Diagnosis not present

## 2018-11-27 DIAGNOSIS — Z7901 Long term (current) use of anticoagulants: Secondary | ICD-10-CM

## 2018-11-27 DIAGNOSIS — I4891 Unspecified atrial fibrillation: Secondary | ICD-10-CM

## 2018-11-27 DIAGNOSIS — K5900 Constipation, unspecified: Secondary | ICD-10-CM | POA: Diagnosis present

## 2018-11-27 DIAGNOSIS — M109 Gout, unspecified: Secondary | ICD-10-CM | POA: Diagnosis present

## 2018-11-27 DIAGNOSIS — E785 Hyperlipidemia, unspecified: Secondary | ICD-10-CM | POA: Diagnosis present

## 2018-11-27 DIAGNOSIS — Z9842 Cataract extraction status, left eye: Secondary | ICD-10-CM | POA: Diagnosis not present

## 2018-11-27 DIAGNOSIS — Z20828 Contact with and (suspected) exposure to other viral communicable diseases: Secondary | ICD-10-CM | POA: Diagnosis present

## 2018-11-27 DIAGNOSIS — R791 Abnormal coagulation profile: Secondary | ICD-10-CM | POA: Diagnosis not present

## 2018-11-27 DIAGNOSIS — Z801 Family history of malignant neoplasm of trachea, bronchus and lung: Secondary | ICD-10-CM

## 2018-11-27 LAB — BASIC METABOLIC PANEL
Anion gap: 12 (ref 5–15)
BUN: 19 mg/dL (ref 8–23)
CO2: 27 mmol/L (ref 22–32)
Calcium: 9.3 mg/dL (ref 8.9–10.3)
Chloride: 99 mmol/L (ref 98–111)
Creatinine, Ser: 1.38 mg/dL — ABNORMAL HIGH (ref 0.61–1.24)
GFR calc Af Amer: 58 mL/min — ABNORMAL LOW (ref >60)
GFR calc non Af Amer: 50 mL/min — ABNORMAL LOW (ref >60)
Glucose, Bld: 147 mg/dL — ABNORMAL HIGH (ref 70–99)
Potassium: 4.2 mmol/L (ref 3.5–5.1)
Sodium: 138 mmol/L (ref 135–145)

## 2018-11-27 LAB — TROPONIN I (HIGH SENSITIVITY)
Troponin I (High Sensitivity): 47 ng/L — ABNORMAL HIGH (ref <18)
Troponin I (High Sensitivity): 49 ng/L — ABNORMAL HIGH (ref <18)

## 2018-11-27 LAB — CBC
HCT: 42.3 % (ref 39.0–52.0)
Hemoglobin: 13.1 g/dL (ref 13.0–17.0)
MCH: 28 pg (ref 26.0–34.0)
MCHC: 31 g/dL (ref 30.0–36.0)
MCV: 90.4 fL (ref 80.0–100.0)
Platelets: 189 10*3/uL (ref 150–400)
RBC: 4.68 MIL/uL (ref 4.22–5.81)
RDW: 14.3 % (ref 11.5–15.5)
WBC: 6.6 10*3/uL (ref 4.0–10.5)
nRBC: 0 % (ref 0.0–0.2)

## 2018-11-27 LAB — URINALYSIS, ROUTINE W REFLEX MICROSCOPIC
Bacteria, UA: NONE SEEN
Bilirubin Urine: NEGATIVE
Glucose, UA: NEGATIVE mg/dL
Ketones, ur: NEGATIVE mg/dL
Leukocytes,Ua: NEGATIVE
Nitrite: NEGATIVE
Protein, ur: NEGATIVE mg/dL
Specific Gravity, Urine: 1.009 (ref 1.005–1.030)
pH: 5 (ref 5.0–8.0)

## 2018-11-27 LAB — PROTIME-INR
INR: 4 — ABNORMAL HIGH (ref 0.8–1.2)
Prothrombin Time: 38.6 seconds — ABNORMAL HIGH (ref 11.4–15.2)

## 2018-11-27 LAB — BRAIN NATRIURETIC PEPTIDE: B Natriuretic Peptide: 452.2 pg/mL — ABNORMAL HIGH (ref 0.0–100.0)

## 2018-11-27 MED ORDER — VITAMIN B-12 1000 MCG PO TABS
1000.0000 ug | ORAL_TABLET | Freq: Every day | ORAL | Status: DC
  Administered 2018-11-28 – 2018-11-29 (×2): 1000 ug via ORAL
  Filled 2018-11-27 (×2): qty 1

## 2018-11-27 MED ORDER — LEVOTHYROXINE SODIUM 112 MCG PO TABS
112.0000 ug | ORAL_TABLET | Freq: Every day | ORAL | Status: DC
  Administered 2018-11-28 – 2018-11-29 (×2): 112 ug via ORAL
  Filled 2018-11-27 (×2): qty 1

## 2018-11-27 MED ORDER — FUROSEMIDE 40 MG PO TABS
40.0000 mg | ORAL_TABLET | Freq: Every day | ORAL | Status: DC
  Administered 2018-11-28 – 2018-11-29 (×2): 40 mg via ORAL
  Filled 2018-11-27 (×2): qty 1

## 2018-11-27 MED ORDER — ALLOPURINOL 100 MG PO TABS
200.0000 mg | ORAL_TABLET | Freq: Every day | ORAL | Status: DC
  Administered 2018-11-28 – 2018-11-29 (×2): 200 mg via ORAL
  Filled 2018-11-27 (×2): qty 2

## 2018-11-27 MED ORDER — ISOSORBIDE MONONITRATE ER 30 MG PO TB24
15.0000 mg | ORAL_TABLET | Freq: Every day | ORAL | Status: DC
  Administered 2018-11-28 – 2018-11-29 (×2): 15 mg via ORAL
  Filled 2018-11-27 (×2): qty 1

## 2018-11-27 MED ORDER — INSULIN ASPART 100 UNIT/ML ~~LOC~~ SOLN
0.0000 [IU] | Freq: Three times a day (TID) | SUBCUTANEOUS | Status: DC
  Administered 2018-11-28 – 2018-11-29 (×2): 2 [IU] via SUBCUTANEOUS
  Administered 2018-11-29 (×2): 1 [IU] via SUBCUTANEOUS

## 2018-11-27 MED ORDER — ETOMIDATE 2 MG/ML IV SOLN
10.0000 mg | Freq: Once | INTRAVENOUS | Status: DC
  Filled 2018-11-27: qty 10

## 2018-11-27 MED ORDER — ETOMIDATE 2 MG/ML IV SOLN
INTRAVENOUS | Status: AC | PRN
  Administered 2018-11-27: 8 mg via INTRAVENOUS

## 2018-11-27 MED ORDER — DILTIAZEM HCL 25 MG/5ML IV SOLN: 10.0000 mg | Freq: Once | INTRAVENOUS | Status: DC

## 2018-11-27 MED ORDER — DILTIAZEM HCL-DEXTROSE 100-5 MG/100ML-% IV SOLN (PREMIX)
5.0000 mg/h | INTRAVENOUS | Status: DC
  Administered 2018-11-27: 5 mg/h via INTRAVENOUS
  Administered 2018-11-28: 10 mg/h via INTRAVENOUS
  Administered 2018-11-28 (×3): 15 mg/h via INTRAVENOUS
  Filled 2018-11-27 (×8): qty 100

## 2018-11-27 MED ORDER — PANTOPRAZOLE SODIUM 40 MG PO TBEC
40.0000 mg | DELAYED_RELEASE_TABLET | Freq: Every morning | ORAL | Status: DC
  Administered 2018-11-28 – 2018-11-29 (×2): 40 mg via ORAL
  Filled 2018-11-27 (×2): qty 1

## 2018-11-27 MED ORDER — ATORVASTATIN CALCIUM 40 MG PO TABS
40.0000 mg | ORAL_TABLET | Freq: Every day | ORAL | Status: DC
  Administered 2018-11-28 – 2018-11-29 (×2): 40 mg via ORAL
  Filled 2018-11-27 (×2): qty 1

## 2018-11-27 MED ORDER — FUROSEMIDE 10 MG/ML IJ SOLN
40.0000 mg | Freq: Once | INTRAMUSCULAR | Status: AC
  Administered 2018-11-27: 40 mg via INTRAVENOUS
  Filled 2018-11-27: qty 4

## 2018-11-27 NOTE — ED Notes (Signed)
Pt assisted to call his wife per his request

## 2018-11-27 NOTE — H&P (Signed)
History and Physical    [name] MEQ:[id number] DOB: 1946-02-22 DOA: 11/27/2018  PCP: Debbrah Alar, NP  Patient coming from: Home  I have personally briefly reviewed patient's old medical records in [place]  Chief Complaint: Increasing shortness of breath and chest pain  HPI: [name] is a 73 y.o. male with medical history significant of Congestive heart failure, persistent atrial fibrillation on Coumadin, hypertension, CVA with residual left-sided deficit, OSA on nighttime oxygen 2 L who presented with worsening shortness of breath and chest pain that started last night at rest.  Patient also endorsed orthopnea and paroxysmal nocturnal dyspnea.  He endorses being compliant with his medications.  Reports that he has a very strict low-sodium and low sugar diet.  He does endorse chronic edema to his left foot.  Denies any fever, cough or runny nose.  Denies any nausea, vomiting diarrhea abdominal pain.  Denies any urinary dysuria, frequency or urgency.  ED Course: Patient was afebrile and found to be in atrial fibrillation with RVR with rates up to 160 and blood pressure of 120 over 90s.  Cardioversion was attempted but was unsuccessful.  He was then placed on IV diltiazem drip with improvement of his rates down to the 90s to 110.  Cardiology was consulted by EDP and he was given a dose of IV 30m Lasix.CBC showed no leukocytosis or anemia.  BMP of 1.38 with baseline of around 1.3.  Troponin at 47 and then 49.  BUN of 452.  INR was elevated to 4.  Urinalysis was negative. CXR was negative.   Review of Systems:  Constitutional: No Weight Change, No Fever ENT/Mouth: No sore throat, No Rhinorrhea Eyes: No Eye Pain, No Vision Changes Cardiovascular: + Chest Pain, +SOB, + PND, + Orthopnea, No Edema, No Palpitations Respiratory: No Cough, No Sputum, No Wheezing, Dyspnea  Gastrointestinal: No Nausea, No Vomiting, No Diarrhea, No Constipation, No Pain Genitourinary: no  Urinary Incontinence, No Urgency, No Flank Pain Musculoskeletal: No Arthralgias, No Myalgias Skin: No Skin Lesions, No Pruritus, Neuro: no Weakness, No Numbness,  No Loss of Consciousness, No Syncope Psych: No Anxiety/Panic, No Depression, decrease appetite Heme/Lymph: No Bruising, No Bleeding Past Medical History:  Diagnosis Date   Acute encephalopathy 12/12/2015   Acute respiratory failure with hypoxia and hypercapnia (HCC) 12/12/2015   Allergic rhinitis 02/13/2010   Qualifier: Diagnosis of  By: YWynona Luna   Angina at rest ([facility] 10/22/2015   Ataxia 08/10/2013   B12 deficiency 08/11/2013   BACK PAIN, LUMBAR 01/29/2009   Qualifier: Diagnosis of  By: YWynona Luna   BENIGN POSITIONAL VERTIGO 11/23/2009   Qualifier: Diagnosis of  By: YWynona Luna   Chronic diastolic heart failure ([place] 04/24/2015   Chronic pain    left sided-[place]   Coronary 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 ([place] 11/04/2015   s/p CABG 2004; CFX DES 2011   Essential hypertension 12/12/2006   Qualifier: Diagnosis of  By: BMarca AnconaRMA, Lucy     GERD 12/12/2006   Qualifier: Diagnosis of  By: [place]Reatha Arm[place]    GOUT 12/12/2006   Qualifier: Diagnosis of  By: BMarca AnconaRMA, Lucy     Hyperlipidemia LDL goal <70 12/12/2006   Qualifier: Diagnosis of  By: BMarca AnconaRMA, Lucy      Hypogonadism male 04/10/2011   Hypothyroidism, acquired 02/15/2013   Insomnia 05/21/2013   Long term current use of anticoagulant therapy 06/24/2011   Obstructive  sleep apnea-Failed CPAP 06/17/2007   Failed CPAP Using O 2 for sleep    Paroxysmal atrial fibrillation ([place]) 12/06/2016   Stroke (cerebrum) ([place]) 01/13/2017   also 1996; resultant hemiplegia of dominant side   Thoracic aorta atherosclerosis ([place]) 04/27/2017   Tubular adenoma of colon 05/2010   Type 2 diabetes mellitus without complication, without long-term current use of insulin ([place]) 10/05/2007   Qualifier: Diagnosis of  By: Wynona Luna     Vasovagal syncope     Past Surgical History:  Procedure Laterality Date   CARDIAC CATHETERIZATION N/A 10/25/2015   Procedure: Left Heart Cath and Cors/Grafts Angiography;  Surgeon: Troy Sine, MD;  Location: [place] CV LAB;  Service: Cardiovascular;  Laterality: N/A;   CARDIOVERSION  06/20/2011   Procedure: CARDIOVERSION;  Surgeon: Larey Dresser, MD;  Location: [facility] ENDOSCOPY;  Service: Cardiovascular;  Laterality: N/A;   CARDIOVERSION N/A 04/26/2015   Procedure: CARDIOVERSION;  Surgeon: Dorothy Spark, MD;  Location: [facility] ENDOSCOPY;  Service: Cardiovascular;  Laterality: N/A;   CARDIOVERSION N/A 05/10/2015   Procedure: CARDIOVERSION;  Surgeon: Larey Dresser, MD;  Location: [place];  Service: Cardiovascular;  Laterality: N/A;   CATARACT EXTRACTION  05/2010   left eye   CATARACT EXTRACTION  04/2010   right eye   CORONARY ARTERY BYPASS GRAFT     stent   ESOPHAGOGASTRODUODENOSCOPY  03-25-2005   LEFT HEART CATH AND CORS/GRAFTS ANGIOGRAPHY N/A 02/19/2018   Procedure: LEFT HEART CATH AND CORS/GRAFTS ANGIOGRAPHY;  Surgeon: Martinique, Peter M, MD;  Location: [place] CV LAB;  Service: Cardiovascular;  Laterality: N/A;   NASAL SEPTUM SURGERY     PERCUTANEOUS PLACEMENT INTRAVASCULAR STENT CERVICAL CAROTID ARTERY     03-2009; using a drug-eluting platform of the circumflex cornoray artery with a 3.0 x 18 [employer] Promus drug-eluting platform post dilated to 3.75 with a noncompliant balloon.   TEE WITHOUT CARDIOVERSION  06/20/2011   Procedure: TRANSESOPHAGEAL ECHOCARDIOGRAM (TEE);  Surgeon: Larey Dresser, MD;  Location: [facility] ENDOSCOPY;  Service: Cardiovascular;  Laterality: N/A;     reports that he has never smoked. He has never used smokeless tobacco. He reports that he does not drink alcohol or use drugs.  No Known Allergies  Family History  Problem Relation Age of Onset   Lung cancer Father        deceased   Stroke Mother        deceased-MINISTROKES    Family history reviewed and not pertinent   Prior to Admission medications   Medication Sig Start Date End Date Taking? Authorizing Provider  allopurinol (ZYLOPRIM) 100 MG tablet TAKE 2 TABLETS BY MOUTH EVERY DAY Patient taking differently: Take 200 mg by mouth daily.  09/27/18  Yes Debbrah Alar, NP  atorvastatin (LIPITOR) 40 MG tablet Take 1 tablet (40 mg total) by mouth daily. 09/01/18  Yes End, Harrell Gave, MD  benazepril (LOTENSIN) 10 MG tablet TAKE 1 TABLET BY MOUTH EVERY DAY Patient taking differently: Take 10 mg by mouth daily. (take with 65m tablet for a total of 132mdaily) 07/06/18  Yes O'Debbrah AlarNP  benazepril (LOTENSIN) 5 MG tablet TAKE 1 TABLET BY MOUTH DAILY WITH 10MG OF BENAZEPRIL TO EQUAL 15MG DAILY Patient taking differently: Take 5 mg by mouth daily. (take with 107mablet for a total of 21m19mily) 07/06/18  Yes O'SuDebbrah Alar  fluocinonide gel (LIDEX) 0.05 % Apply to scalp once daily as needed Patient taking differently: Apply 1 application topically daily as needed (itching).  05/11/18  Yes Debbrah Alar, NP  fluticasone (FLONASE) 50 MCG/ACT nasal spray Place 2 sprays into both nostrils daily. 06/02/14  Yes Brunetta Jeans, PA-C  furosemide (LASIX) 40 MG tablet TAKE 1 TABLET BY MOUTH EVERY OTHER DAY Patient taking differently: Take 40 mg by mouth every other day.  09/02/18  Yes Debbrah Alar, NP  isosorbide mononitrate (IMDUR) 30 MG 24 hr tablet TAKE 1/2 TABLET BY MOUTH EVERY DAY Patient taking differently: Take 15 mg by mouth daily.  10/18/18  Yes End, Harrell Gave, MD  levothyroxine (SYNTHROID, LEVOTHROID) 112 MCG tablet TAKE 1 TABLET (112 MCG TOTAL) BY MOUTH DAILY BEFORE BREAKFAST. 05/31/18  Yes Debbrah Alar, NP  metFORMIN (GLUCOPHAGE) 500 MG tablet TAKE 1 TABLET (500 MG TOTAL) DAILY WITH BREAKFAST BY MOUTH. 10/04/18  Yes Debbrah Alar, NP  Multiple Vitamins-Minerals (MULTIVITAMIN GUMMIES MENS PO) Take 1 tablet by mouth daily  with breakfast.    Yes [provider]  nitroGLYCERIN (NITROSTAT) 0.4 MG SL tablet Place 1 tablet (0.4 mg total) under the tongue every 5 (five) minutes as needed for chest pain. Up to 3 doses 03/05/15  Yes Debbrah Alar, NP  OXYGEN Inhale 2 L into the lungs at bedtime.   Yes [provider]  pantoprazole (PROTONIX) 40 MG tablet TAKE 1 TABLET (40 MG TOTAL) EVERY MORNING BY MOUTH. 11/02/18  Yes Debbrah Alar, NP  traMADol (ULTRAM) 50 MG tablet TAKE 1 TABLET BY MOUTH TWICE A DAY 05/12/18  Yes Kirsteins, Luanna Salk, MD  traMADol (ULTRAM-ER) 200 MG 24 hr tablet Take 1 tablet (200 mg total) by mouth daily. 05/28/18  Yes Kirsteins, Luanna Salk, MD  vitamin B-12 1000 MCG tablet Take 1 tablet (1,000 mcg total) by mouth daily. 08/11/13  Yes Tat, Shanon Brow, MD  warfarin (COUMADIN) 2.5 MG tablet TAKE AS DIRECTED BY COUMADIN CLINIC Patient taking differently: Take 1.25-2.5 mg by mouth See admin instructions. Take 2.45m by mouth on MON WED FRI SAT, then take 1.296mon TUES THUR SUN 10/06/18  Yes End, ChHarrell GaveMD  ACCU-CHEK GUIDE test strip USE UP TO FOUR TIMES DAILY AS DIRECTED 09/07/18   O'Debbrah AlarNP  blood glucose meter kit and supplies KIT Dispense based on patient and insurance preference. Use up to four times daily as directed. (FOR ICD-9 250.00, 250.01). 08/10/18   O'Debbrah AlarNP    Physical Exam: Vitals:   11/27/18 2120 11/27/18 2125 11/27/18 2130 11/27/18 2145  BP: (!) 116/96 (!) 136/97 (!) 128/104 (!) 134/99  Pulse: (!) 46 (!) 108 62   Resp: 17 (!) 25 (!) 21 (!) 22  Temp:      TempSrc:      SpO2: 97% 94% 99%     Constitutional: NAD, calm, comfortable Vitals:   11/27/18 2120 11/27/18 2125 11/27/18 2130 11/27/18 2145  BP: (!) 116/96 (!) 136/97 (!) 128/104 (!) 134/99  Pulse: (!) 46 (!) 108 62   Resp: 17 (!) 25 (!) 21 (!) 22  Temp:      TempSrc:      SpO2: 97% 94% 99%    Eyes: PERRL, lids and conjunctivae normal ENMT: Mucous membranes are moist.   Neck: normal, supple, no masses, no thyromegaly Respiratory: clear to auscultation bilaterally, no wheezing, no crackles. Normal respiratory effort. No accessory muscle use. On 2L via [place].  Cardiovascular: Irregularly irregular with heart rate of 90s on telemetry.  No murmurs / rubs / gallops.  Nonpitting edema of the left foot.. 2+ pedal pulses.   Abdomen: no tenderness, no masses palpated. No  hepatosplenomegaly. Bowel sounds positive.  Musculoskeletal: no clubbing / cyanosis. No joint deformity upper and lower extremities. Good ROM, no contractures. Normal muscle tone.  Skin: no rashes, lesions, ulcers. No induration Neurologic: Slow speech.  Left-sided facial asymmetry-residual from past CVA. CN 2-12 grossly intact. Sensation intact, DTR normal. Strength 4/5 in left lower extremity. Psychiatric: Normal judgment and insight. Alert and oriented x 3. Normal mood.    Labs on Admission: I have personally reviewed following labs and imaging studies  CBC: Recent Labs  Lab 11/27/18 1527  WBC 6.6  HGB 13.1  HCT 42.3  MCV 90.4  PLT 818   Basic Metabolic Panel: Recent Labs  Lab 11/27/18 1527  NA 138  K 4.2  CL 99  CO2 27  GLUCOSE 147*  BUN 19  CREATININE 1.38*  CALCIUM 9.3   GFR: CrCl cannot be calculated (Unknown ideal weight.). Liver Function Tests: No results for input(s): AST, ALT, ALKPHOS, BILITOT, PROT, ALBUMIN in the last 168 hours. No results for input(s): LIPASE, AMYLASE in the last 168 hours. No results for input(s): AMMONIA in the last 168 hours. Coagulation Profile: Recent Labs  Lab 11/27/18 1750  INR 4.0*   Cardiac Enzymes: No results for input(s): CKTOTAL, CKMB, CKMBINDEX, TROPONINI in the last 168 hours. BNP (last 3 results) No results for input(s): PROBNP in the last 8760 hours. HbA1C: No results for input(s): HGBA1C in the last 72 hours. CBG: No results for input(s): GLUCAP in the last 168 hours. Lipid Profile: No results for input(s): CHOL, HDL,  LDLCALC, TRIG, CHOLHDL, LDLDIRECT in the last 72 hours. Thyroid Function Tests: No results for input(s): TSH, T4TOTAL, FREET4, T3FREE, THYROIDAB in the last 72 hours. Anemia Panel: No results for input(s): VITAMINB12, FOLATE, FERRITIN, TIBC, IRON, RETICCTPCT in the last 72 hours. Urine analysis:    Component Value Date/Time   COLORURINE YELLOW 11/27/2018 1937   APPEARANCEUR CLEAR 11/27/2018 1937   LABSPEC 1.009 11/27/2018 1937   PHURINE 5.0 11/27/2018 1937   GLUCOSEU NEGATIVE 11/27/2018 1937   HGBUR SMALL (A) 11/27/2018 1937   BILIRUBINUR NEGATIVE 11/27/2018 1937   BILIRUBINUR neg 04/27/2017 1258   KETONESUR NEGATIVE 11/27/2018 1937   PROTEINUR NEGATIVE 11/27/2018 1937   UROBILINOGEN 0.2 04/27/2017 1258   UROBILINOGEN 1.0 11/25/2014 1806   NITRITE NEGATIVE 11/27/2018 1937   LEUKOCYTESUR NEGATIVE 11/27/2018 1937    Radiological Exams on Admission: Dg Chest 2 View  Result Date: 11/27/2018 CLINICAL DATA:  Shortness of breath. EXAM: CHEST - 2 VIEW COMPARISON:  02/17/2018 FINDINGS: Stable asymmetric elevation right hemidiaphragm. Right base chronic atelectasis/scarring is similar to prior. No pulmonary edema or focal airspace consolidation. Cardiopericardial silhouette is at upper limits of normal for size. The visualized bony structures of the thorax are intact. IMPRESSION: Stable.  No acute findings. Electronically Signed   By: Misty Stanley M.D.   On: 11/27/2018 16:10   Vas [place] Lower Extremity Venous (dvt) (only Mc & Wl 7a-7p)  Result Date: 11/27/2018  Lower Venous Study Indications: Swelling, SOB, and Patient is on Coumadin.  Risk Factors: Rapid atrial fibrillation. Comparison Study: No prior study on file Performing Technologist: Sharion Dove RVS  Examination Guidelines: A complete evaluation includes B-mode imaging, spectral Doppler, color Doppler, and power Doppler as needed of all accessible portions of each vessel. Bilateral testing is considered an integral part of a complete  examination. Limited examinations for reoccurring indications may be performed as noted.  +-----+---------------+---------+-----------+----------+-------------------+  RIGHT Compressibility Phasicity Spontaneity Properties Thrombus Aging       +-----+---------------+---------+-----------+----------+-------------------+  CFV   Full                                             pulsatile waveforms  +-----+---------------+---------+-----------+----------+-------------------+   +---------+---------------+---------+-----------+----------+-------------------+  LEFT      Compressibility Phasicity Spontaneity Properties Thrombus Aging       +---------+---------------+---------+-----------+----------+-------------------+  CFV       Full                                             pulsatile waveforms  +---------+---------------+---------+-----------+----------+-------------------+  SFJ       Full                                                                  +---------+---------------+---------+-----------+----------+-------------------+  FV Prox   Full                                                                  +---------+---------------+---------+-----------+----------+-------------------+  FV Mid    Full                                                                  +---------+---------------+---------+-----------+----------+-------------------+  FV Distal Full                                                                  +---------+---------------+---------+-----------+----------+-------------------+  PFV       Full                                                                  +---------+---------------+---------+-----------+----------+-------------------+  POP       Full                                             pulsatile waveforms  +---------+---------------+---------+-----------+----------+-------------------+  PTV       Full                                                                   +---------+---------------+---------+-----------+----------+-------------------+  PERO      Full                                                                  +---------+---------------+---------+-----------+----------+-------------------+     Summary: Right: No evidence of common femoral vein obstruction. Left: There is no evidence of deep vein thrombosis in the lower extremity.  *See table(s) above for measurements and observations.    Preliminary     EKG: Independently reviewed.   Assessment/Plan  Atrial fibrillation with RVR  -Possibly secondary to CHF.  Given 1 dose of IV Lasix in ED -Unsuccessful cardioversion in ED x1 -Continue diltiazem drip -On Coumadin for anticoagulation-we will hold as INR is at 4 - Cardiology consulted- Appreciate recs - We will check TSH for completion - Last echo in 02/2018 had EF of 55-60%  Elevated INR - hold coumadin and repeat INR in the morning  History of CHF -- Continue home Lasix   AKI -Hold benazepril  History of gout -No acute flareups -Continue allopurinol  History of hypothyroidism -Continue levothyroxine  History of diabetes -Hold metformin - Sensitive sliding scale   DVT prophylaxis: INR 4 on admission on coumdin Code Status: Full Family Communication: Plan discussed with patient  disposition Plan: Home with 2 midnight stays for atrial fibrillation with RVR and possible fluid overload  Consults called: Cardiology  admission status: cardiac tele   [name] Valverde T Asianae Minkler DO [facility]   If 7PM-7AM, please contact night-coverage [email or web address] Password [facility]  11/27/2018, 10:07 PM

## 2018-11-27 NOTE — Progress Notes (Signed)
VASCULAR LAB PRELIMINARY  PRELIMINARY  PRELIMINARY  PRELIMINARY  Left lower extremity venous duplex completed.    Preliminary report:  See CV proc for preliminary results.  Gave report to Carmon Sails, PA-C  Yohann Curl, RVT 11/27/2018, 6:29 PM

## 2018-11-27 NOTE — ED Provider Notes (Addendum)
[facility] EMERGENCY DEPARTMENT Provider Note   CSN: [id number] Arrival date & time: 11/27/18  1518     History   Chief Complaint Chief Complaint  Patient presents with   Shortness of Breath    HPI [name] is a 73 y.o. male.     Patient is a 73 year old male with a history of coronary artery disease, diabetes, hypertension, CVA with left-sided hemiplegia, atrial fibrillation on Coumadin who presents with weakness.  He states he has not felt well since yesterday.  He has been more fatigued than normal and sleeping more often.  He also has been more short of breath since yesterday.  He says that he has some "angina". He notices that his heart is racing but he is not sure exactly when it started.  He says it started probably couple days ago.  He denies any fevers.  He has a little bit of cough but no congestion in his chest.  He does report some bilateral lower extremity swelling but he says his left leg is more swollen than the right.  Is typically a little bit more swollen but he says it is much more swollen than the right side currently.  He has chronic left-sided weakness from his prior stroke but denies any change in this.  He denies any new unilateral weakness or numbness.  No worsening speech other than his baseline slight slurred speech.  No nausea or vomiting.  No abdominal pain.  He does report some constipation.  No urinary symptoms other than he has been intermittently incontinent recently.     Past Medical History:  Diagnosis Date   Acute encephalopathy 12/12/2015   Acute respiratory failure with hypoxia and hypercapnia (HCC) 12/12/2015   Allergic rhinitis 02/13/2010   Qualifier: Diagnosis of  By: Wynona Luna    Angina at rest [facility]) 10/22/2015   Ataxia 08/10/2013   B12 deficiency 08/11/2013   BACK PAIN, LUMBAR 01/29/2009   Qualifier: Diagnosis of  By: Wynona Luna    BENIGN POSITIONAL VERTIGO 11/23/2009   Qualifier: Diagnosis of   By: Wynona Luna    Chronic diastolic heart failure ([place]) 04/24/2015   Chronic pain    left sided-[place]   Coronary 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 ([place]) 11/04/2015   s/p CABG 2004; CFX DES 2011   Essential hypertension 12/12/2006   Qualifier: Diagnosis of  By: [name] RMA, Lucy     GERD 12/12/2006   Qualifier: Diagnosis of  By: Reatha Armour, Lucy     GOUT 12/12/2006   Qualifier: Diagnosis of  By: [name] RMA, Lucy     Hyperlipidemia LDL goal <70 12/12/2006   Qualifier: Diagnosis of  By: [name] RMA, [place]      Hypogonadism male 04/10/2011   Hypothyroidism, acquired 02/15/2013   Insomnia 05/21/2013   Long term current use of anticoagulant therapy 06/24/2011   Obstructive sleep apnea-Failed CPAP 06/17/2007   Failed CPAP Using O 2 for sleep    Paroxysmal atrial fibrillation ([place]) 12/06/2016   Stroke (cerebrum) ([place]) 01/13/2017   also 1996; resultant hemiplegia of dominant side   Thoracic aorta atherosclerosis ([place]) 04/27/2017   Tubular adenoma of colon 05/2010   Type 2 diabetes mellitus without complication, without long-term current use of insulin ([place]) 10/05/2007   Qualifier: Diagnosis of  By: Wynona Luna    Vasovagal syncope     Patient Active Problem List   Diagnosis Date Noted   Ischemic  chest pain ([place]) 02/17/2018   Pure hypercholesterolemia 05/01/2017   Thoracic aorta atherosclerosis ([place]) 04/27/2017   Stroke (cerebrum) ([place]) 01/13/2017   RUQ abdominal pain 01/13/2017   Paroxysmal atrial fibrillation ([place]) 12/06/2016   Dyspnea on exertion 10/21/2016   Acute respiratory failure with hypoxia and hypercapnia (HCC) 12/12/2015   Coronary 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 ([place]) 11/04/2015   CAD S/P CFX DES 2011 04/25/2015   Persistent atrial fibrillation 04/25/2015   Vasovagal syncope    Constipation 04/24/2015   Chronic diastolic heart failure ([place]) 04/24/2015   B12 deficiency 08/11/2013   Sensory  disturbance 08/11/2013   Ataxia 08/10/2013   Laceration of ear, external, right 07/05/2013   Insomnia 05/21/2013   Hypothyroidism, acquired 02/15/2013   Weakness 11/17/2012   Spastic hemiplegia affecting dominant side ([place]) 07/21/2011   Long term current use of anticoagulant therapy 06/24/2011   Hypogonadism male 04/10/2011   Fatigue 03/31/2011   Allergic rhinitis 02/13/2010   BENIGN POSITIONAL VERTIGO 11/23/2009   Hx of CABG '04 05/04/2009   Hypothyroidism 04/02/2009   Back pain 01/29/2009   Type 2 diabetes mellitus without complication, without long-term current use of insulin ([place]) 10/05/2007   Obstructive sleep apnea-Failed CPAP 06/17/2007   Hyperlipidemia LDL goal <70 12/12/2006   GOUT 12/12/2006   Essential hypertension 12/12/2006   History of stroke with residual deficit 12/12/2006   GERD 12/12/2006    Past Surgical History:  Procedure Laterality Date   CARDIAC CATHETERIZATION N/A 10/25/2015   Procedure: Left Heart Cath and Cors/Grafts Angiography;  Surgeon: Troy Sine, MD;  Location: [facility] CV LAB;  Service: Cardiovascular;  Laterality: N/A;   CARDIOVERSION  06/20/2011   Procedure: CARDIOVERSION;  Surgeon: Larey Dresser, MD;  Location: [facility] ENDOSCOPY;  Service: Cardiovascular;  Laterality: N/A;   CARDIOVERSION N/A 04/26/2015   Procedure: CARDIOVERSION;  Surgeon: Dorothy Spark, MD;  Location: [facility] ENDOSCOPY;  Service: Cardiovascular;  Laterality: N/A;   CARDIOVERSION N/A 05/10/2015   Procedure: CARDIOVERSION;  Surgeon: Larey Dresser, MD;  Location: [facility];  Service: Cardiovascular;  Laterality: N/A;   CATARACT EXTRACTION  05/2010   left eye   CATARACT EXTRACTION  04/2010   right eye   CORONARY ARTERY BYPASS GRAFT     stent   ESOPHAGOGASTRODUODENOSCOPY  03-25-2005   LEFT HEART CATH AND CORS/GRAFTS ANGIOGRAPHY N/A 02/19/2018   Procedure: LEFT HEART CATH AND CORS/GRAFTS ANGIOGRAPHY;  Surgeon: Martinique, Peter M, MD;  Location: [facility] CV LAB;  Service: Cardiovascular;  Laterality: N/A;   NASAL SEPTUM SURGERY     PERCUTANEOUS PLACEMENT INTRAVASCULAR STENT CERVICAL CAROTID ARTERY     03-2009; using a drug-eluting platform of the circumflex cornoray artery with a 3.0 x 18 [employer] Promus drug-eluting platform post dilated to 3.75 with a noncompliant balloon.   TEE WITHOUT CARDIOVERSION  06/20/2011   Procedure: TRANSESOPHAGEAL ECHOCARDIOGRAM (TEE);  Surgeon: Larey Dresser, MD;  Location: [facility];  Service: Cardiovascular;  Laterality: N/A;        Home Medications    Prior to Admission medications   Medication Sig Start Date End Date Taking? Authorizing Provider  allopurinol (ZYLOPRIM) 100 MG tablet TAKE 2 TABLETS BY MOUTH EVERY DAY Patient taking differently: Take 200 mg by mouth daily.  09/27/18  Yes Debbrah Alar, NP  atorvastatin (LIPITOR) 40 MG tablet Take 1 tablet (40 mg total) by mouth daily. 09/01/18  Yes End, Harrell Gave, MD  benazepril (LOTENSIN) 10 MG tablet TAKE 1 TABLET BY MOUTH EVERY DAY Patient  taking differently: Take 10 mg by mouth daily. (take with 81m tablet for a total of 141mdaily) 07/06/18  Yes O'Debbrah AlarNP  benazepril (LOTENSIN) 5 MG tablet TAKE 1 TABLET BY MOUTH DAILY WITH 10MG OF BENAZEPRIL TO EQUAL 15MG DAILY Patient taking differently: Take 5 mg by mouth daily. (take with 1075mablet for a total of 60m22mily) 07/06/18  Yes O'SuDebbrah Alar  fluocinonide gel (LIDEX) 0.05 % Apply to scalp once daily as needed Patient taking differently: Apply 1 application topically daily as needed (itching).  05/11/18  Yes O'SuDebbrah Alar  fluticasone (FLONASE) 50 MCG/ACT nasal spray Place 2 sprays into both nostrils daily. 06/02/14  Yes MartBrunetta Jeans-C  furosemide (LASIX) 40 MG tablet TAKE 1 TABLET BY MOUTH EVERY OTHER DAY Patient taking differently: Take 40 mg by mouth every other day.  09/02/18  Yes O'SuDebbrah Alar  isosorbide mononitrate (IMDUR)  30 MG 24 hr tablet TAKE 1/2 TABLET BY MOUTH EVERY DAY Patient taking differently: Take 15 mg by mouth daily.  10/18/18  Yes End, ChriHarrell Gave  levothyroxine (SYNTHROID, LEVOTHROID) 112 MCG tablet TAKE 1 TABLET (112 MCG TOTAL) BY MOUTH DAILY BEFORE BREAKFAST. 05/31/18  Yes O'SuDebbrah Alar  metFORMIN (GLUCOPHAGE) 500 MG tablet TAKE 1 TABLET (500 MG TOTAL) DAILY WITH BREAKFAST BY MOUTH. 10/04/18  Yes O'SuDebbrah Alar  Multiple Vitamins-Minerals (MULTIVITAMIN GUMMIES MENS PO) Take 1 tablet by mouth daily with breakfast.    Yes [provider]  nitroGLYCERIN (NITROSTAT) 0.4 MG SL tablet Place 1 tablet (0.4 mg total) under the tongue every 5 (five) minutes as needed for chest pain. Up to 3 doses 03/05/15  Yes O'SuDebbrah Alar  OXYGEN Inhale 2 L into the lungs at bedtime.   Yes [provider]  pantoprazole (PROTONIX) 40 MG tablet TAKE 1 TABLET (40 MG TOTAL) EVERY MORNING BY MOUTH. 11/02/18  Yes O'SuDebbrah Alar  traMADol (ULTRAM) 50 MG tablet TAKE 1 TABLET BY MOUTH TWICE A DAY 05/12/18  Yes Kirsteins, AndrLuanna Salk  traMADol (ULTRAM-ER) 200 MG 24 hr tablet Take 1 tablet (200 mg total) by mouth daily. 05/28/18  Yes Kirsteins, AndrLuanna Salk  vitamin B-12 1000 MCG tablet Take 1 tablet (1,000 mcg total) by mouth daily. 08/11/13  Yes Tat, DaviShanon Brow  warfarin (COUMADIN) 2.5 MG tablet TAKE AS DIRECTED BY COUMADIN CLINIC Patient taking differently: Take 1.25-2.5 mg by mouth See admin instructions. Take 2.5mg 1mmouth on MON WED FRI SAT, then take 1.25mg 59mUES THUR SUN 10/06/18  Yes End, ChristHarrell GaveACCU-CHEK GUIDE test strip USE UP TO FOUR TIMES DAILY AS DIRECTED 09/07/18   O'SullDebbrah Alarblood glucose meter kit and supplies KIT Dispense based on patient and insurance preference. Use up to four times daily as directed. (FOR ICD-9 250.00, 250.01). 08/10/18   O'SullDebbrah Alar  Family History Family History  Problem Relation Age of Onset   Lung  cancer Father        deceased   Stroke Mother        deceased-MINISTROKES    Social History Social History   Tobacco Use   Smoking status: Never Smoker   Smokeless tobacco: Never Used  Substance Use Topics   Alcohol use: No    Alcohol/week: 0.0 standard drinks    Comment: 1 beer a month   Drug use: No     Allergies   Patient has no known allergies.   Review of Systems Review  of Systems  Constitutional: Positive for fatigue. Negative for chills, diaphoresis and fever.  HENT: Negative for congestion, rhinorrhea and sneezing.   Eyes: Negative.   Respiratory: Positive for cough and shortness of breath. Negative for chest tightness.   Cardiovascular: Positive for chest pain, palpitations and leg swelling.  Gastrointestinal: Negative for abdominal pain, blood in stool, diarrhea, nausea and vomiting.  Genitourinary: Negative for difficulty urinating, flank pain, frequency and hematuria.  Musculoskeletal: Negative for arthralgias and back pain.  Skin: Negative for rash.  Neurological: Negative for dizziness, speech difficulty, weakness, numbness and headaches.     Physical Exam Updated Vital Signs BP (!) 137/94    Pulse 65    Temp 98 F (36.7 C) (Oral)    Resp 18    SpO2 93%   Physical Exam Constitutional:      Appearance: He is well-developed.  HENT:     Head: Normocephalic and atraumatic.  Eyes:     Pupils: Pupils are equal, round, and reactive to light.  Neck:     Musculoskeletal: Normal range of motion and neck supple.  Cardiovascular:     Rate and Rhythm: Regular rhythm. Tachycardia present.     Heart sounds: Normal heart sounds.  Pulmonary:     Effort: Pulmonary effort is normal. No tachypnea or respiratory distress.     Breath sounds: Normal breath sounds. No wheezing or rales.  Chest:     Chest wall: No tenderness.  Abdominal:     General: Bowel sounds are normal.     Palpations: Abdomen is soft.     Tenderness: There is no abdominal tenderness.  There is no guarding or rebound.  Musculoskeletal: Normal range of motion.     Right lower leg: Edema present.     Left lower leg: Edema present.     Comments: 1+ pitting edema to the right lower extremity, 3+ pitting edema to the left lower extremity  Lymphadenopathy:     Cervical: No cervical adenopathy.  Skin:    General: Skin is warm and dry.     Findings: No rash.  Neurological:     Mental Status: He is alert and oriented to person, place, and time.     Comments: Left-sided weakness from a prior stroke, he has some mild slurred speech which he also says is baseline for him.      ED Treatments / Results  Labs (all labs ordered are listed, but only abnormal results are displayed) Labs Reviewed  BASIC METABOLIC PANEL - Abnormal; Notable for the following components:      Result Value   Glucose, Bld 147 (*)    Creatinine, Ser 1.38 (*)    GFR calc non Af Amer 50 (*)    GFR calc Af Amer 58 (*)    All other components within normal limits  PROTIME-INR - Abnormal; Notable for the following components:   Prothrombin Time 38.6 (*)    INR 4.0 (*)    All other components within normal limits  URINALYSIS, ROUTINE W REFLEX MICROSCOPIC - Abnormal; Notable for the following components:   Hgb urine dipstick SMALL (*)    All other components within normal limits  BRAIN NATRIURETIC PEPTIDE - Abnormal; Notable for the following components:   B Natriuretic Peptide 452.2 (*)    All other components within normal limits  TROPONIN I (HIGH SENSITIVITY) - Abnormal; Notable for the following components:   Troponin I (High Sensitivity) 47 (*)    All other components within normal limits  TROPONIN  I (HIGH SENSITIVITY) - Abnormal; Notable for the following components:   Troponin I (High Sensitivity) 49 (*)    All other components within normal limits  CBC    EKG EKG Interpretation  Date/Time:  Saturday November 27 2018 15:25:18 EDT Ventricular Rate:  169 PR Interval:    QRS  Duration: 90 QT Interval:  292 QTC Calculation: 489 R Axis:   -29 Text Interpretation:  Atrial fibrillation with rapid ventricular response with premature ventricular or aberrantly conducted complexes Left ventricular hypertrophy Nonspecific ST and T wave abnormality Abnormal ECG Confirmed by Malvin Johns [phone]) on 11/27/2018 4:48:23 PM   Radiology Dg Chest 2 View  Result Date: 11/27/2018 CLINICAL DATA:  Shortness of breath. EXAM: CHEST - 2 VIEW COMPARISON:  02/17/2018 FINDINGS: Stable asymmetric elevation right hemidiaphragm. Right base chronic atelectasis/scarring is similar to prior. No pulmonary edema or focal airspace consolidation. Cardiopericardial silhouette is at upper limits of normal for size. The visualized bony structures of the thorax are intact. IMPRESSION: Stable.  No acute findings. Electronically Signed   By: Misty Stanley M.D.   On: 11/27/2018 16:10   Vas [place] Lower Extremity Venous (dvt) (only Mc & Wl 7a-7p)  Result Date: 11/27/2018  Lower Venous Study Indications: Swelling, SOB, and Patient is on Coumadin.  Risk Factors: Rapid atrial fibrillation. Comparison Study: No prior study on file Performing Technologist: Sharion Dove RVS  Examination Guidelines: A complete evaluation includes B-mode imaging, spectral Doppler, color Doppler, and power Doppler as needed of all accessible portions of each vessel. Bilateral testing is considered an integral part of a complete examination. Limited examinations for reoccurring indications may be performed as noted.  +-----+---------------+---------+-----------+----------+-------------------+  RIGHT Compressibility Phasicity Spontaneity Properties Thrombus Aging       +-----+---------------+---------+-----------+----------+-------------------+  CFV   Full                                             pulsatile waveforms  +-----+---------------+---------+-----------+----------+-------------------+    +---------+---------------+---------+-----------+----------+-------------------+  LEFT      Compressibility Phasicity Spontaneity Properties Thrombus Aging       +---------+---------------+---------+-----------+----------+-------------------+  CFV       Full                                             pulsatile waveforms  +---------+---------------+---------+-----------+----------+-------------------+  SFJ       Full                                                                  +---------+---------------+---------+-----------+----------+-------------------+  FV Prox   Full                                                                  +---------+---------------+---------+-----------+----------+-------------------+  FV Mid    Full                                                                  +---------+---------------+---------+-----------+----------+-------------------+  FV Distal Full                                                                  +---------+---------------+---------+-----------+----------+-------------------+  PFV       Full                                                                  +---------+---------------+---------+-----------+----------+-------------------+  POP       Full                                             pulsatile waveforms  +---------+---------------+---------+-----------+----------+-------------------+  PTV       Full                                                                  +---------+---------------+---------+-----------+----------+-------------------+  PERO      Full                                                                  +---------+---------------+---------+-----------+----------+-------------------+     Summary: Right: No evidence of common femoral vein obstruction. Left: There is no evidence of deep vein thrombosis in the lower extremity.  *See table(s) above for measurements and observations.    Preliminary      Procedures .Sedation  Date/Time: 11/27/2018 9:29 PM Performed by: Malvin Johns, MD Authorized by: Malvin Johns, MD   Consent:    Consent obtained:  Verbal   Consent given by:  Patient   Risks discussed:  Allergic reaction, dysrhythmia, inadequate sedation, vomiting, respiratory compromise necessitating ventilatory assistance and intubation, prolonged sedation necessitating reversal and prolonged hypoxia resulting in organ damage   Alternatives discussed:  Analgesia without sedation Universal protocol:    Procedure explained and questions answered to patient or proxy's satisfaction: yes     Relevant documents present and verified: yes     Test results available and properly labeled: yes     Imaging studies available: yes     Required blood products, implants, devices, and special equipment available: yes     Site/side marked: no     Immediately prior to procedure a time out was called: yes     Patient identity confirmation method:  Arm band Indications:    Procedure performed:  Cardioversion   Procedure necessitating sedation performed by:  Physician performing sedation Pre-sedation assessment:    Time since last food or drink:  6   ASA classification: class 3 - patient with severe systemic disease  Mallampati score:  II - soft palate, uvula, fauces visible   Pre-sedation assessments completed and reviewed: airway patency, cardiovascular function, hydration status, mental status, nausea/vomiting, pain level, respiratory function and temperature     Pre-sedation assessment completed:  11/27/2018 8:00 PM Immediate pre-procedure details:    Reassessment: Patient reassessed immediately prior to procedure     Reviewed: vital signs and relevant labs/tests     Verified: bag valve mask available, emergency equipment available, intubation equipment available, IV patency confirmed, oxygen available and suction available   Procedure details (see MAR for exact dosages):     Preoxygenation:  Nasal cannula   Sedation:  Etomidate   Intra-procedure monitoring:  Cardiac monitor, blood pressure monitoring, continuous capnometry, continuous pulse oximetry, frequent LOC assessments and frequent vital sign checks   Intra-procedure events: none     Total Provider sedation time (minutes):  20 Post-procedure details:    Post-sedation assessment completed:  11/27/2018 9:30 PM   Attendance: Constant attendance by certified staff until patient recovered     Recovery: Patient returned to pre-procedure baseline     Post-sedation assessments completed and reviewed: airway patency, cardiovascular function, hydration status, mental status, nausea/vomiting, pain level, respiratory function and temperature     Patient is stable for discharge or admission: yes     Patient tolerance:  Tolerated well, no immediate complications .Cardioversion  Date/Time: 11/27/2018 9:31 PM Performed by: Malvin Johns, MD Authorized by: Malvin Johns, MD   Consent:    Consent obtained:  Written   Consent given by:  Patient   Risks discussed:  Cutaneous burn, induced arrhythmia, pain and death   Alternatives discussed:  No treatment Pre-procedure details:    Cardioversion basis:  Emergent   Rhythm:  Atrial fibrillation Patient sedated: Yes. Refer to sedation procedure documentation for details of sedation.  Attempt one:    Cardioversion mode:  Synchronous   Waveform:  Biphasic   Shock (Joules):  200   Shock outcome:  No change in rhythm Post-procedure details:    Patient status:  Alert   Patient tolerance of procedure:  Tolerated well, no immediate complications   (including critical care time)  Medications Ordered in ED Medications  diltiazem (CARDIZEM) 100 mg in dextrose 5% 168m (1 mg/mL) infusion (15 mg/hr Intravenous Rate/Dose Change 11/27/18 2021)  furosemide (LASIX) injection 40 mg (has no administration in time range)     Initial Impression / Assessment and Plan / ED Course   I have reviewed the triage vital signs and the nursing notes.  Pertinent labs & imaging results that were available during my care of the patient were reviewed by me and considered in my medical decision making (see chart for details).        Patient is a 73year old male who presents with generalized weakness that is been going on the last few days.  He felt like his heart is been racing.  He describes some chest pain although it is hard to say whether this associated just with the tachycardia.  He does have some associated mild shortness of breath.  He has some increased leg swelling.  He seems to be a little fluid overloaded with an elevated BNP.  No overt pulmonary edema on x-ray.  His Coumadin is therapeutic.  His troponins are mildly elevated but no significant change between the first and second.  Patient was started on Cardizem and his rate is improving on the Cardizem drip with a heart rate now in the low 110s to 120s.  He is  feeling a little bit better after this.  I attempted cardioversion in the ED but this was unsuccessful.  He had a brief pause after the shock but went back into A. fib rhythm.  He was given dose of IV Lasix.  I have consulted with cardiology, Dr. Vickki Muff, to see the pt.Marland Kitchen  He requests hospitalist admission. I spoke with Dr. Flossie Buffy who will admit the pt. Patient's current weight tonight using the bed scale is 242 pounds.  He says he does not weigh himself regularly at home but thinks his weight is around 250 normally.  CRITICAL CARE Performed by: Malvin Johns Total critical care time: 60 minutes Critical care time was exclusive of separately billable procedures and treating other patients. Critical care was necessary to treat or prevent imminent or life-threatening deterioration. Critical care was time spent personally by me on the following activities: development of treatment plan with patient and/or surrogate as well as nursing, discussions with consultants, evaluation of  patient's response to treatment, examination of patient, obtaining history from patient or surrogate, ordering and performing treatments and interventions, ordering and review of laboratory studies, ordering and review of radiographic studies, pulse oximetry and re-evaluation of patient's condition.   Final Clinical Impressions(s) / ED Diagnoses   Final diagnoses:  Atrial fibrillation with rapid ventricular response (HCC)  Congestive heart failure, unspecified HF chronicity, unspecified heart failure type [facility])    ED Discharge Orders    None       Malvin Johns, MD 11/27/18 2037    Malvin Johns, MD 11/27/18 2037    Malvin Johns, MD 11/27/18 2137

## 2018-11-27 NOTE — ED Notes (Signed)
MD aware of Pt systolic [id number].

## 2018-11-27 NOTE — ED Notes (Signed)
Respiratory called for conscious sedation/cardioversion

## 2018-11-27 NOTE — ED Triage Notes (Signed)
Patient c/o shortness of breath with his a-fib starting yesterday afternoon. Patient reports taking his coumadin. Speaking in full sentences and appears in NAD.

## 2018-11-27 NOTE — Sedation Documentation (Signed)
Cardioversion performed at [place]

## 2018-11-28 DIAGNOSIS — I509 Heart failure, unspecified: Secondary | ICD-10-CM

## 2018-11-28 DIAGNOSIS — N179 Acute kidney failure, unspecified: Secondary | ICD-10-CM

## 2018-11-28 DIAGNOSIS — R791 Abnormal coagulation profile: Secondary | ICD-10-CM

## 2018-11-28 DIAGNOSIS — I480 Paroxysmal atrial fibrillation: Secondary | ICD-10-CM

## 2018-11-28 DIAGNOSIS — Z8639 Personal history of other endocrine, nutritional and metabolic disease: Secondary | ICD-10-CM

## 2018-11-28 DIAGNOSIS — Z8739 Personal history of other diseases of the musculoskeletal system and connective tissue: Secondary | ICD-10-CM

## 2018-11-28 LAB — CBC
HCT: 42.6 % (ref 39.0–52.0)
Hemoglobin: 13.3 g/dL (ref 13.0–17.0)
MCH: 27.8 pg (ref 26.0–34.0)
MCHC: 31.2 g/dL (ref 30.0–36.0)
MCV: 89.1 fL (ref 80.0–100.0)
Platelets: 155 10*3/uL (ref 150–400)
RBC: 4.78 MIL/uL (ref 4.22–5.81)
RDW: 14.4 % (ref 11.5–15.5)
WBC: 6.9 10*3/uL (ref 4.0–10.5)
nRBC: 0 % (ref 0.0–0.2)

## 2018-11-28 LAB — SARS CORONAVIRUS 2 BY RT PCR (HOSPITAL ORDER, PERFORMED IN ~~LOC~~ HOSPITAL LAB): SARS Coronavirus 2: NEGATIVE

## 2018-11-28 LAB — BASIC METABOLIC PANEL
Anion gap: 16 — ABNORMAL HIGH (ref 5–15)
BUN: 18 mg/dL (ref 8–23)
CO2: 24 mmol/L (ref 22–32)
Calcium: 9 mg/dL (ref 8.9–10.3)
Chloride: 100 mmol/L (ref 98–111)
Creatinine, Ser: 1.32 mg/dL — ABNORMAL HIGH (ref 0.61–1.24)
GFR calc Af Amer: >60 mL/min (ref >60)
GFR calc non Af Amer: 52 mL/min — ABNORMAL LOW (ref >60)
Glucose, Bld: 107 mg/dL — ABNORMAL HIGH (ref 70–99)
Potassium: 4.1 mmol/L (ref 3.5–5.1)
Sodium: 140 mmol/L (ref 135–145)

## 2018-11-28 LAB — TSH: TSH: 1.822 u[IU]/mL (ref 0.350–4.500)

## 2018-11-28 LAB — GLUCOSE, CAPILLARY
Glucose-Capillary: 128 mg/dL — ABNORMAL HIGH (ref 70–99)
Glucose-Capillary: 168 mg/dL — ABNORMAL HIGH (ref 70–99)
Glucose-Capillary: 179 mg/dL — ABNORMAL HIGH (ref 70–99)
Glucose-Capillary: 87 mg/dL (ref 70–99)
Glucose-Capillary: 89 mg/dL (ref 70–99)

## 2018-11-28 LAB — PROTIME-INR
INR: 3.5 — ABNORMAL HIGH (ref 0.8–1.2)
Prothrombin Time: 34.6 seconds — ABNORMAL HIGH (ref 11.4–15.2)

## 2018-11-28 MED ORDER — TRAMADOL HCL 50 MG PO TABS
50.0000 mg | ORAL_TABLET | Freq: Four times a day (QID) | ORAL | Status: DC
  Administered 2018-11-28 – 2018-11-29 (×5): 50 mg via ORAL
  Filled 2018-11-28 (×5): qty 1

## 2018-11-28 MED ORDER — SENNOSIDES-DOCUSATE SODIUM 8.6-50 MG PO TABS
2.0000 | ORAL_TABLET | Freq: Two times a day (BID) | ORAL | Status: DC
  Administered 2018-11-28 – 2018-11-29 (×3): 2 via ORAL
  Filled 2018-11-28 (×4): qty 2

## 2018-11-28 MED ORDER — INFLUENZA VAC A&B SA ADJ QUAD 0.5 ML IM PRSY
0.5000 mL | PREFILLED_SYRINGE | INTRAMUSCULAR | Status: AC
  Administered 2018-11-29: 0.5 mL via INTRAMUSCULAR
  Filled 2018-11-28: qty 0.5

## 2018-11-28 MED ORDER — TRAMADOL HCL ER 200 MG PO TB24: 200.0000 mg | ORAL_TABLET | Freq: Every day | ORAL | Status: DC

## 2018-11-28 MED ORDER — WARFARIN - PHARMACIST DOSING INPATIENT: Freq: Every day | Status: DC

## 2018-11-28 MED ORDER — DILTIAZEM HCL 30 MG PO TABS
30.0000 mg | ORAL_TABLET | Freq: Four times a day (QID) | ORAL | Status: DC
  Administered 2018-11-28 – 2018-11-29 (×4): 30 mg via ORAL
  Filled 2018-11-28 (×4): qty 1

## 2018-11-28 MED ORDER — TRAMADOL HCL 50 MG PO TABS
50.0000 mg | ORAL_TABLET | Freq: Two times a day (BID) | ORAL | Status: DC
  Administered 2018-11-28: 50 mg via ORAL
  Filled 2018-11-28: qty 1

## 2018-11-28 MED ORDER — MUSCLE RUB 10-15 % EX CREA
TOPICAL_CREAM | CUTANEOUS | Status: DC | PRN
  Administered 2018-11-29: 1 via TOPICAL
  Filled 2018-11-28: qty 85

## 2018-11-28 NOTE — Consult Note (Addendum)
CARDIOLOGY CONSULT NOTE  Patient ID: [name] MRN: [id number] DOB/AGE: Jan 12, 1946 73 y.o.  Admit date: 11/27/2018 Primary Physician: Debbrah Alar, NP  Primary Cardiologist: Dr. Marlou Porch Chief Complaint: Weakness, Shortness of breath Requesting: ED  HPI:  [name] is a 73 y.o. male with CAD s/p CABG, paroxysmal AF (on warfarin), OSA, prior CVA with residual left hemiplegia, HLD,  hypothyroidism, DMII, and HFpEF who presents to the ED with shortness of breath and chest discomfort.  He was subsequently found to be in AF with RVR.  Cardiology is now consulted for assistance with management of this atrial arrhythmia and symptoms.   Patient reports acute onset of fatigue that started 1 day prior to admission.  Associated with this fatigue, patient also noticed that his heart was racing, but he cannot pinpoint exactly when this began.  In addition, patient endorses vague chest pain and shortness of breath that accompanied his racing heartbeat.  He decided to present to the ED for evaluation of these symptoms when they did not abate with conservative measures at home.  In the [facility] ED, patient was afebrile and found to be in atrial fibrillation with RVR with rates up to the 160 and blood pressure of 120 over 90s.  Cardioversion was attempted but was unsuccessful after one attempt.  He was placed on an IV diltiazem drip with improvement of his rates. CBC was normal.  BMP notable for sCr of 1.38.  Troponin x2 was 47 and then 49.  INR was elevated to 4.  Urinalysis was negative. CXR was negative.   At the time of my interview, patient reports feeling much better overall; even though he is still in atrial fibrillation. He does endorse some bilateral lower extremity swelling, L >>R, which is why he wears TEDs hose on L leg. He has chronic left-sided weakness from his prior stroke but denies any change in this. Denies any fever, cough or runny nose.  Denies any nausea, vomiting diarrhea  abdominal pain.  Denies any urinary dysuria, frequency or urgency.       Past Medical History:  Diagnosis Date  . Acute encephalopathy 12/12/2015  . Acute respiratory failure with hypoxia and hypercapnia ([place]) 12/12/2015  . Allergic rhinitis 02/13/2010   Qualifier: Diagnosis of  By: Wynona Luna   . Angina at rest [facility]) 10/22/2015  . Ataxia 08/10/2013  . B12 deficiency 08/11/2013  . BACK PAIN, LUMBAR 01/29/2009   Qualifier: Diagnosis of  By: Wynona Luna   . BENIGN POSITIONAL VERTIGO 11/23/2009   Qualifier: Diagnosis of  By: Wynona Luna   . Chronic diastolic heart failure ([place]) 04/24/2015  . Chronic pain    left sided-Kristeins  . Coronary 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 ([place]) 11/04/2015   s/p CABG 2004; CFX DES 2011  . Essential hypertension 12/12/2006   Qualifier: Diagnosis of  By: [name] RMA, Lucy    . GERD 12/12/2006   Qualifier: Diagnosis of  By: [name] RMA, Lucy    . GOUT 12/12/2006   Qualifier: Diagnosis of  By: Reatha Armour, Lucy    . Hyperlipidemia LDL goal <70 12/12/2006   Qualifier: Diagnosis of  By: [name] RMA, Lucy     . Hypogonadism male 04/10/2011  . Hypothyroidism, acquired 02/15/2013  . Insomnia 05/21/2013  . Long term current use of anticoagulant therapy 06/24/2011  . Obstructive sleep apnea-Failed CPAP 06/17/2007   Failed CPAP Using O 2 for sleep   . Paroxysmal atrial  fibrillation ([place]) 12/06/2016  . Stroke (cerebrum) ([place]) 01/13/2017   also 1996; resultant hemiplegia of dominant side  . Thoracic aorta atherosclerosis ([place]) 04/27/2017  . Tubular adenoma of colon 05/2010  . Type 2 diabetes mellitus without complication, without long-term current use of insulin ([place]) 10/05/2007   Qualifier: Diagnosis of  By: Wynona Luna   . Vasovagal syncope     Past Surgical History:  Procedure Laterality Date  . CARDIAC CATHETERIZATION N/A 10/25/2015   Procedure: Left Heart Cath and Cors/Grafts Angiography;  Surgeon: Troy Sine, MD;  Location:  [place] CV LAB;  Service: Cardiovascular;  Laterality: N/A;  . CARDIOVERSION  06/20/2011   Procedure: CARDIOVERSION;  Surgeon: Larey Dresser, MD;  Location: [facility] ENDOSCOPY;  Service: Cardiovascular;  Laterality: N/A;  . CARDIOVERSION N/A 04/26/2015   Procedure: CARDIOVERSION;  Surgeon: Dorothy Spark, MD;  Location: [facility] ENDOSCOPY;  Service: Cardiovascular;  Laterality: N/A;  . CARDIOVERSION N/A 05/10/2015   Procedure: CARDIOVERSION;  Surgeon: Larey Dresser, MD;  Location: [place];  Service: Cardiovascular;  Laterality: N/A;  . CATARACT EXTRACTION  05/2010   left eye  . CATARACT EXTRACTION  04/2010   right eye  . CORONARY ARTERY BYPASS GRAFT     stent  . ESOPHAGOGASTRODUODENOSCOPY  03-25-2005  . LEFT HEART CATH AND CORS/GRAFTS ANGIOGRAPHY N/A 02/19/2018   Procedure: LEFT HEART CATH AND CORS/GRAFTS ANGIOGRAPHY;  Surgeon: Martinique, Peter M, MD;  Location: [place] CV LAB;  Service: Cardiovascular;  Laterality: N/A;  . NASAL SEPTUM SURGERY    . PERCUTANEOUS PLACEMENT INTRAVASCULAR STENT CERVICAL CAROTID ARTERY     03-2009; using a drug-eluting platform of the circumflex cornoray artery with a 3.0 x 18 [employer] Promus drug-eluting platform post dilated to 3.75 with a noncompliant balloon.  . TEE WITHOUT CARDIOVERSION  06/20/2011   Procedure: TRANSESOPHAGEAL ECHOCARDIOGRAM (TEE);  Surgeon: Larey Dresser, MD;  Location: [place];  Service: Cardiovascular;  Laterality: N/A;    No Known Allergies Medications Prior to Admission  Medication Sig Dispense Refill Last Dose  . allopurinol (ZYLOPRIM) 100 MG tablet TAKE 2 TABLETS BY MOUTH EVERY DAY (Patient taking differently: Take 200 mg by mouth daily. ) 180 tablet 1 11/27/2018 at Unknown time  . atorvastatin (LIPITOR) 40 MG tablet Take 1 tablet (40 mg total) by mouth daily. 90 tablet 3 11/27/2018 at Unknown time  . benazepril (LOTENSIN) 10 MG tablet TAKE 1 TABLET BY MOUTH EVERY DAY (Patient taking differently: Take 10 mg by mouth daily.  (take with 67m tablet for a total of 117mdaily)) 90 tablet 3 11/27/2018 at Unknown time  . benazepril (LOTENSIN) 5 MG tablet TAKE 1 TABLET BY MOUTH DAILY WITH 10MG OF BENAZEPRIL TO EQUAL 15MG DAILY (Patient taking differently: Take 5 mg by mouth daily. (take with 1038mablet for a total of 70m28mily)) 90 tablet 2 11/27/2018 at Unknown time  . fluocinonide gel (LIDEX) 0.05 % Apply to scalp once daily as needed (Patient taking differently: Apply 1 application topically daily as needed (itching). ) 60 g 0 Past Week at Unknown time  . fluticasone (FLONASE) 50 MCG/ACT nasal spray Place 2 sprays into both nostrils daily. 9.9 g 1 11/27/2018 at Unknown time  . furosemide (LASIX) 40 MG tablet TAKE 1 TABLET BY MOUTH EVERY OTHER DAY (Patient taking differently: Take 40 mg by mouth every other day. ) 45 tablet 1 11/26/2018 at Unknown time  . isosorbide mononitrate (IMDUR) 30 MG 24 hr tablet TAKE 1/2 TABLET BY MOUTH EVERY DAY (  Patient taking differently: Take 15 mg by mouth daily. ) 45 tablet 3 11/27/2018 at Unknown time  . levothyroxine (SYNTHROID, LEVOTHROID) 112 MCG tablet TAKE 1 TABLET (112 MCG TOTAL) BY MOUTH DAILY BEFORE BREAKFAST. 90 tablet 1 11/27/2018 at Unknown time  . metFORMIN (GLUCOPHAGE) 500 MG tablet TAKE 1 TABLET (500 MG TOTAL) DAILY WITH BREAKFAST BY MOUTH. 90 tablet 1 11/27/2018 at Unknown time  . Multiple Vitamins-Minerals (MULTIVITAMIN GUMMIES MENS PO) Take 1 tablet by mouth daily with breakfast.    11/27/2018 at Unknown time  . nitroGLYCERIN (NITROSTAT) 0.4 MG SL tablet Place 1 tablet (0.4 mg total) under the tongue every 5 (five) minutes as needed for chest pain. Up to 3 doses 10 tablet 2 UNK  . OXYGEN Inhale 2 L into the lungs at bedtime.   11/27/2018 at Unknown time  . pantoprazole (PROTONIX) 40 MG tablet TAKE 1 TABLET (40 MG TOTAL) EVERY MORNING BY MOUTH. 90 tablet 1 11/27/2018 at Unknown time  . traMADol (ULTRAM) 50 MG tablet TAKE 1 TABLET BY MOUTH TWICE A DAY 180 tablet 1 11/27/2018 at Unknown time   . traMADol (ULTRAM-ER) 200 MG 24 hr tablet Take 1 tablet (200 mg total) by mouth daily. 90 tablet 1 11/27/2018 at Unknown time  . vitamin B-12 1000 MCG tablet Take 1 tablet (1,000 mcg total) by mouth daily. 30 tablet 0 11/27/2018 at Unknown time  . warfarin (COUMADIN) 2.5 MG tablet TAKE AS DIRECTED BY COUMADIN CLINIC (Patient taking differently: Take 1.25-2.5 mg by mouth See admin instructions. Take 2.46m by mouth on MON WED FRI SAT, then take 1.222mon TUES THUR SUN) 90 tablet 0 11/27/2018 at 0900  . ACCU-CHEK GUIDE test strip USE UP TO FOUR TIMES DAILY AS DIRECTED 100 strip 0   . blood glucose meter kit and supplies KIT Dispense based on patient and insurance preference. Use up to four times daily as directed. (FOR ICD-9 250.00, 250.01). 1 each 0    Family History  Problem Relation Age of Onset  . Lung cancer Father        deceased  . Stroke Mother        deceased-MINISTROKES    Social History   Socioeconomic History  . Marital status: Married    Spouse name: GlPeter Congo. Number of children: Not on file  . Years of education: 1428. Highest education level: Not on file  Occupational History  . Occupation: retired    EmFish farm managerOTHER    Comment: aiMining engineerSocial Needs  . Financial resource strain: Not on file  . Food insecurity    Worry: Not on file    Inability: Not on file  . Transportation needs    Medical: Not on file    Non-medical: Not on file  Tobacco Use  . Smoking status: Never Smoker  . Smokeless tobacco: Never Used  Substance and Sexual Activity  . Alcohol use: No    Alcohol/week: 0.0 standard drinks    Comment: 1 beer a month  . Drug use: No  . Sexual activity: Not Currently  Lifestyle  . Physical activity    Days per week: Not on file    Minutes per session: Not on file  . Stress: Not on file  Relationships  . Social coHerbalistn phone: Not on file    Gets together: Not on file    Attends religious service: Not on file    Active member of  club or organization: Not on  file    Attends meetings of clubs or organizations: Not on file    Relationship status: Not on file  . Intimate partner violence    Fear of current or ex partner: Not on file    Emotionally abused: Not on file    Physically abused: Not on file    Forced sexual activity: Not on file  Other Topics Concern  . Not on file  Social History Narrative   Pt lives with wife. Does have stairs, but patient doesn't use them. Pt has completed technical school     Review of Systems: [y] = yes, [ ]  = no       General: Weight gain [] ; Weight loss [ ] ; Anorexia [ ] ; Fatigue [ ] ; Fever [ ] ; Chills [ ] ; Weakness [ ]     Cardiac: Chest pain/pressure [ ] ; Resting SOB [ ] ; Exertional SOB [ ] ; Orthopnea [ ] ; Pedal Edema [ ] ; Palpitations [ ] ; Syncope [ ] ; Presyncope [ ] ; Paroxysmal nocturnal dyspnea[ ]     Pulmonary: Cough [ ] ; Wheezing[ ] ; Hemoptysis[ ] ; Sputum [ ] ; Snoring [ ]     GI: Vomiting[ ] ; Dysphagia[ ] ; Melena[ ] ; Hematochezia [ ] ; Heartburn[ ] ; Abdominal pain [ ] ; Constipation [ ] ; Diarrhea [ ] ; BRBPR [ ]     GU: Hematuria[ ] ; Dysuria [ ] ; Nocturia[ ]   Vascular: Pain in legs with walking [ ] ; Pain in feet with lying flat [ ] ; Non-healing sores [ ] ; Stroke [ ] ; TIA [ ] ; Slurred speech [ ] ;    Neuro: Headaches[ ] ; Vertigo[ ] ; Seizures[ ] ; Paresthesias[ ] ;Blurred vision [ ] ; Diplopia [ ] ; Vision changes [ ]     Ortho/Skin: Arthritis [ ] ; Joint pain [ ] ; Muscle pain [ ] ; Joint swelling [ ] ; Back Pain [ ] ; Rash [ ]     Psych: Depression[ ] ; Anxiety[ ]     Heme: Bleeding problems [ ] ; Clotting disorders [ ] ; Anemia [ ]     Endocrine: Diabetes [ ] ; Thyroid dysfunction[ ]   Physical Exam: Blood pressure (!) 113/91, pulse 64, temperature 97.8 F (36.6 C), temperature source Oral, resp. rate 15, height 6' 3"  (1.905 m), weight 112.7 kg, SpO2 94 %.   Gen: Sitting comfortably in bed no acute distress. Respiratory: clear to auscultation in the bilateral anterior lung fields; no  other adventitious lung sounds. Normal respiratory effort. No accessory muscle use.   Cardiovascular: Irregularly irregular; not tachycardic.  No murmurs / rubs / gallops.  Abdomen:  Soft, nontender, not distended. Bowel sounds positive.  Ext: Bilateral lower extremity edema L>> R, warm and well-perfused, distal pulses intact. Neurologic: Moves all extremities well, AAOx4, no gross motor or sensory deficits  Labs: Lab Results  Component Value Date   BUN 19 11/27/2018   Lab Results  Component Value Date   CREATININE 1.38 (H) 11/27/2018   Lab Results  Component Value Date   NA 138 11/27/2018   K 4.2 11/27/2018   CL 99 11/27/2018   CO2 27 11/27/2018   Lab Results  Component Value Date   TROPONINI 0.03 ([place]) 02/18/2018   Lab Results  Component Value Date   WBC 6.6 11/27/2018   HGB 13.1 11/27/2018   HCT 42.3 11/27/2018   MCV 90.4 11/27/2018   PLT 189 11/27/2018   Lab Results  Component Value Date   CHOL 117 02/18/2018   HDL 34 (L) 02/18/2018   LDLCALC 57 02/18/2018   TRIG 130 02/18/2018   CHOLHDL 3.4 02/18/2018   Lab Results  Component Value Date  ALT 44 01/27/2018   AST 40 (H) 01/27/2018   ALKPHOS 60 01/27/2018   BILITOT 0.6 01/27/2018      Radiology:    CXR: Stable.  No acute findings.   EKG:  Initial ECG: AF with RVR (rates in 150s), LAFB, no dynamic ST changes   ASSESSMENT AND PLAN:   [name] is a 73 y.o. male with CAD s/p CABG, paroxysmal AF (on warfarin), OSA, prior CVA with residual left hemiplegia, HLD,  hypothyroidism, DMII, and HFpEF who presents to the ED with shortness of breath and chest discomfort.  He was subsequently found to be in AF with RVR.  Cardiology is now consulted for assistance with management of this atrial arrhythmia and symptoms.  # Paroxysmal AF on chronic anticoagulation # Chest Discomfort # SOB # HFpEF # Elevated Troponin # Elevated INR :: Suspect that what patient is describing as chest discomfort is actually  palpitations alone. Troponin mildly elevated but this is somewhat nonspecific in our particular clinical setting.  Patient reports feeling better since his failed DCCV, but this may be somewhat of a placebo effect as he is still in atrial fibrillation. -Continue rate control with diltiazem drip for now -Recommend reaching out to pharmacy to assist with warfarin dosing in the setting of supratherapeutic INR -Patient's volume status is not entirely clear to me and we were unable to obtain a standing weight in the ED for comparison   - recommend 40 mg IV Lasix once, assess strict I's and O's, and obtain daily standing weights  -We will follow patient's symptoms and response to diuresis, as well as effect on his renal function - If patient experiences recurrence of chest discomfort, would recommend obtaining stat ECG and rechecking troponin Repeat TTE in a.m. - Monitor closely on telemetry      Signed: Clois Dupes 11/28/2018, 12:19 AM

## 2018-11-28 NOTE — Plan of Care (Signed)

## 2018-11-28 NOTE — Progress Notes (Signed)
Late entry 1440- order to start Dilt PO q 6 hours, I asked Dr Waldron Labs to clarify if to stop dilt qtt, pt to continue to on dilt qtt but wean and monitor HR as pt starting on low dose po and hx of brady with lopressor. Pt given dilt po and decreased dilt to 10mg /hr  1800- pt in bathroom attempting bm, HR up to 120's, pt assisted back to bed and po dilt given, will keep diltazem at 10mg /hr due to inc HR with walking

## 2018-11-28 NOTE — Progress Notes (Signed)
Pt asking why he cant have a regular sprite, I advised he has blood sugars and asked if diabetes, he says he prediabetic, I advised I would ask MD, also asking about pain med as he is having back pain, paged MD for pt pain med he takes at [facility]

## 2018-11-28 NOTE — Progress Notes (Signed)
ANTICOAGULATION CONSULT NOTE - Initial Consult  Pharmacy Consult for Coumadin Indication: atrial fibrillation  No Known Allergies  Patient Measurements: Height: _0  (190.5 cm) Weight: 248 lb 6.4 oz (112.7 kg) IBW/kg (Calculated) : 84.5  Vital Signs: Temp: 97.3 F (36.3 C) (09/13 1126) Temp Source: Oral (09/13 1126) BP: 118/82 (09/13 1126) Pulse Rate: 36 (09/13 1126)  Labs: Recent Labs    11/27/18 1527 11/27/18 1750 11/28/18 0002 11/28/18 0433  HGB 13.1  --  13.3  --   HCT 42.3  --  42.6  --   PLT 189  --  155  --   LABPROT  --  38.6*  --  34.6*  INR  --  4.0*  --  3.5*  CREATININE 1.38*  --  1.32*  --   TROPONINIHS 47* 49*  --   --     Estimated Creatinine Clearance: 65.5 mL/min (A) (by C-G formula based on SCr of 1.32 mg/dL (H)).   Medical History: Past Medical History:  Diagnosis Date  . Acute encephalopathy 12/12/2015  . Acute respiratory failure with hypoxia and hypercapnia ([place]) 12/12/2015  . Allergic rhinitis 02/13/2010   Qualifier: Diagnosis of  By: Wynona Luna   . Angina at rest [facility]) 10/22/2015  . Ataxia 08/10/2013  . B12 deficiency 08/11/2013  . BACK PAIN, LUMBAR 01/29/2009   Qualifier: Diagnosis of  By: Wynona Luna   . BENIGN POSITIONAL VERTIGO 11/23/2009   Qualifier: Diagnosis of  By: Wynona Luna   . Chronic diastolic heart failure ([place]) 04/24/2015  . Chronic pain    left sided-Kristeins  . Coronary 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 ([place]) 11/04/2015   s/p CABG 2004; CFX DES 2011  . Essential hypertension 12/12/2006   Qualifier: Diagnosis of  By: [name] RMA, Lucy    . GERD 12/12/2006   Qualifier: Diagnosis of  By: [name] RMA, Lucy    . GOUT 12/12/2006   Qualifier: Diagnosis of  By: Reatha Armour, Lucy    . Hyperlipidemia LDL goal <70 12/12/2006   Qualifier: Diagnosis of  By: [name] RMA, Lucy     . Hypogonadism male 04/10/2011  . Hypothyroidism, acquired 02/15/2013  . Insomnia 05/21/2013  . Long term current use of  anticoagulant therapy 06/24/2011  . Obstructive sleep apnea-Failed CPAP 06/17/2007   Failed CPAP Using O 2 for sleep   . Paroxysmal atrial fibrillation ([place]) 12/06/2016  . Stroke (cerebrum) ([place]) 01/13/2017   also 1996; resultant hemiplegia of dominant side  . Thoracic aorta atherosclerosis ([place]) 04/27/2017  . Tubular adenoma of colon 05/2010  . Type 2 diabetes mellitus without complication, without long-term current use of insulin ([place]) 10/05/2007   Qualifier: Diagnosis of  By: Wynona Luna   . Vasovagal syncope     Medications:  Medications Prior to Admission  Medication Sig Dispense Refill Last Dose  . allopurinol (ZYLOPRIM) 100 MG tablet TAKE 2 TABLETS BY MOUTH EVERY DAY (Patient taking differently: Take 200 mg by mouth daily. ) 180 tablet 1 11/27/2018 at Unknown time  . atorvastatin (LIPITOR) 40 MG tablet Take 1 tablet (40 mg total) by mouth daily. 90 tablet 3 11/27/2018 at Unknown time  . benazepril (LOTENSIN) 10 MG tablet TAKE 1 TABLET BY MOUTH EVERY DAY (Patient taking differently: Take 10 mg by mouth daily. (take with 61m tablet for a total of 191mdaily)) 90 tablet 3 11/27/2018 at Unknown time  . benazepril (LOTENSIN) 5 MG tablet TAKE 1 TABLET BY  MOUTH DAILY WITH 10MG OF BENAZEPRIL TO EQUAL 15MG DAILY (Patient taking differently: Take 5 mg by mouth daily. (take with 41m tablet for a total of 17mdaily)) 90 tablet 2 11/27/2018 at Unknown time  . fluocinonide gel (LIDEX) 0.05 % Apply to scalp once daily as needed (Patient taking differently: Apply 1 application topically daily as needed (itching). ) 60 g 0 Past Week at Unknown time  . fluticasone (FLONASE) 50 MCG/ACT nasal spray Place 2 sprays into both nostrils daily. 9.9 g 1 11/27/2018 at Unknown time  . furosemide (LASIX) 40 MG tablet TAKE 1 TABLET BY MOUTH EVERY OTHER DAY (Patient taking differently: Take 40 mg by mouth every other day. ) 45 tablet 1 11/26/2018 at Unknown time  . isosorbide mononitrate (IMDUR) 30 MG 24 hr tablet TAKE 1/2  TABLET BY MOUTH EVERY DAY (Patient taking differently: Take 15 mg by mouth daily. ) 45 tablet 3 11/27/2018 at Unknown time  . levothyroxine (SYNTHROID, LEVOTHROID) 112 MCG tablet TAKE 1 TABLET (112 MCG TOTAL) BY MOUTH DAILY BEFORE BREAKFAST. 90 tablet 1 11/27/2018 at Unknown time  . metFORMIN (GLUCOPHAGE) 500 MG tablet TAKE 1 TABLET (500 MG TOTAL) DAILY WITH BREAKFAST BY MOUTH. 90 tablet 1 11/27/2018 at Unknown time  . Multiple Vitamins-Minerals (MULTIVITAMIN GUMMIES MENS PO) Take 1 tablet by mouth daily with breakfast.    11/27/2018 at Unknown time  . nitroGLYCERIN (NITROSTAT) 0.4 MG SL tablet Place 1 tablet (0.4 mg total) under the tongue every 5 (five) minutes as needed for chest pain. Up to 3 doses 10 tablet 2 UNK  . OXYGEN Inhale 2 L into the lungs at bedtime.   11/27/2018 at Unknown time  . pantoprazole (PROTONIX) 40 MG tablet TAKE 1 TABLET (40 MG TOTAL) EVERY MORNING BY MOUTH. 90 tablet 1 11/27/2018 at Unknown time  . traMADol (ULTRAM) 50 MG tablet TAKE 1 TABLET BY MOUTH TWICE A DAY 180 tablet 1 11/27/2018 at Unknown time  . traMADol (ULTRAM-ER) 200 MG 24 hr tablet Take 1 tablet (200 mg total) by mouth daily. 90 tablet 1 11/27/2018 at Unknown time  . vitamin B-12 1000 MCG tablet Take 1 tablet (1,000 mcg total) by mouth daily. 30 tablet 0 11/27/2018 at Unknown time  . warfarin (COUMADIN) 2.5 MG tablet TAKE AS DIRECTED BY COUMADIN CLINIC (Patient taking differently: Take 1.25-2.5 mg by mouth See admin instructions. Take 2.68m64my mouth on MON WED FRI SAT, then take 1.268m42m TUES THUR SUN) 90 tablet 0 11/27/2018 at 0900  . ACCU-CHEK GUIDE test strip USE UP TO FOUR TIMES DAILY AS DIRECTED 100 strip 0   . blood glucose meter kit and supplies KIT Dispense based on patient and insurance preference. Use up to four times daily as directed. (FOR ICD-9 250.00, 250.01). 1 each 0     Assessment: 73 y13. male admitted with SOB/chest pain, h/o Afib, to continue Coumadin. INR Supratherapeutic today. INR is trending  down from 4 to 3.5 after holding dose yesterday. Hg/Hct and plts are stable.  Goal of Therapy:  INR 2-3 Monitor platelets by anticoagulation protocol: Yes   Plan:  No Coumadin tonight F/U Daily INR Monitor signs/symptoms of bleeding  KushSherren KernsarmD PGY1 Acute Care Pharmacy Resident [phone]/2020,2:04 PM

## 2018-11-28 NOTE — Progress Notes (Signed)
Spoke to [employer], gave upadate. Will monitor

## 2018-11-28 NOTE — Progress Notes (Signed)
Pt did not use urinal as he had to go try for BM, no collection device in bathroom, pt says he cant use urinal and sit on commode, pt has left side weak due to hx of cva, will place collection hat in toilet for further output monitoring

## 2018-11-28 NOTE — Progress Notes (Signed)
ANTICOAGULATION CONSULT NOTE - Initial Consult  Pharmacy Consult for Coumadin Indication: atrial fibrillation  No Known Allergies  Patient Measurements:    Vital Signs: Temp: 97.8 F (36.6 C) (09/12 2356) Temp Source: Oral (09/12 2356) BP: 113/91 (09/12 2356) Pulse Rate: 64 (09/12 2356)  Labs: Recent Labs    11/27/18 1527 11/27/18 1750  HGB 13.1  --   HCT 42.3  --   PLT 189  --   LABPROT  --  38.6*  INR  --  4.0*  CREATININE 1.38*  --   TROPONINIHS 47* 49*    CrCl cannot be calculated (Unknown ideal weight.).   Medical History: Past Medical History:  Diagnosis Date  . Acute encephalopathy 12/12/2015  . Acute respiratory failure with hypoxia and hypercapnia ([place]) 12/12/2015  . Allergic rhinitis 02/13/2010   Qualifier: Diagnosis of  By: Wynona Luna   . Angina at rest [facility] 10/22/2015  . Ataxia 08/10/2013  . B12 deficiency 08/11/2013  . BACK PAIN, LUMBAR 01/29/2009   Qualifier: Diagnosis of  By: Wynona Luna   . BENIGN POSITIONAL VERTIGO 11/23/2009   Qualifier: Diagnosis of  By: Wynona Luna   . Chronic diastolic heart failure ([place]) 04/24/2015  . Chronic pain    left sided-[place]  . Coronary 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 ([place]) 11/04/2015   s/p CABG 2004; CFX DES 2011  . Essential hypertension 12/12/2006   Qualifier: Diagnosis of  By: [name] RMA, Lucy    . GERD 12/12/2006   Qualifier: Diagnosis of  By: [name] RMA, Lucy    . GOUT 12/12/2006   Qualifier: Diagnosis of  By: Reatha Armour, Lucy    . Hyperlipidemia LDL goal <70 12/12/2006   Qualifier: Diagnosis of  By: [name] RMA, Lucy     . Hypogonadism male 04/10/2011  . Hypothyroidism, acquired 02/15/2013  . Insomnia 05/21/2013  . Long term current use of anticoagulant therapy 06/24/2011  . Obstructive sleep apnea-Failed CPAP 06/17/2007   Failed CPAP Using O 2 for sleep   . Paroxysmal atrial fibrillation ([place]) 12/06/2016  . 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 Thoracic aorta atherosclerosis ([place]) 04/27/2017  . Tubular adenoma of colon 05/2010  . Type 2 diabetes mellitus without complication, without long-term current use of insulin ([place]) 10/05/2007   Qualifier: Diagnosis of  By: Wynona Luna   . Vasovagal syncope     Medications:  Medications Prior to Admission  Medication Sig Dispense Refill Last Dose  . allopurinol (ZYLOPRIM) 100 MG tablet TAKE 2 TABLETS BY MOUTH EVERY DAY (Patient taking differently: Take 200 mg by mouth daily. ) 180 tablet 1 11/27/2018 at Unknown time  . atorvastatin (LIPITOR) 40 MG tablet Take 1 tablet (40 mg total) by mouth daily. 90 tablet 3 11/27/2018 at Unknown time  . benazepril (LOTENSIN) 10 MG tablet TAKE 1 TABLET BY MOUTH EVERY DAY (Patient taking differently: Take 10 mg by mouth daily. (take with 50m tablet for a total of 114mdaily)) 90 tablet 3 11/27/2018 at Unknown time  . benazepril (LOTENSIN) 5 MG tablet TAKE 1 TABLET BY MOUTH DAILY WITH 10MG OF BENAZEPRIL TO EQUAL 15MG DAILY (Patient taking differently: Take 5 mg by mouth daily. (take with 1085mablet for a total of 41m44mily)) 90 tablet 2 11/27/2018 at Unknown time  . fluocinonide gel (LIDEX) 0.05 % Apply to scalp once daily as needed (Patient taking differently: Apply 1 application topically  daily as needed (itching). ) 60 g 0 Past Week at Unknown time  . fluticasone (FLONASE) 50 MCG/ACT nasal spray Place 2 sprays into both nostrils daily. 9.9 g 1 11/27/2018 at Unknown time  . furosemide (LASIX) 40 MG tablet TAKE 1 TABLET BY MOUTH EVERY OTHER DAY (Patient taking differently: Take 40 mg by mouth every other day. ) 45 tablet 1 11/26/2018 at Unknown time  . isosorbide mononitrate (IMDUR) 30 MG 24 hr tablet TAKE 1/2 TABLET BY MOUTH EVERY DAY (Patient taking differently: Take 15 mg by mouth daily. ) 45 tablet 3 11/27/2018 at Unknown time  . levothyroxine (SYNTHROID, LEVOTHROID) 112 MCG tablet TAKE 1 TABLET (112 MCG TOTAL) BY MOUTH DAILY  BEFORE BREAKFAST. 90 tablet 1 11/27/2018 at Unknown time  . metFORMIN (GLUCOPHAGE) 500 MG tablet TAKE 1 TABLET (500 MG TOTAL) DAILY WITH BREAKFAST BY MOUTH. 90 tablet 1 11/27/2018 at Unknown time  . Multiple Vitamins-Minerals (MULTIVITAMIN GUMMIES MENS PO) Take 1 tablet by mouth daily with breakfast.    11/27/2018 at Unknown time  . nitroGLYCERIN (NITROSTAT) 0.4 MG SL tablet Place 1 tablet (0.4 mg total) under the tongue every 5 (five) minutes as needed for chest pain. Up to 3 doses 10 tablet 2 UNK  . OXYGEN Inhale 2 L into the lungs at bedtime.   11/27/2018 at Unknown time  . pantoprazole (PROTONIX) 40 MG tablet TAKE 1 TABLET (40 MG TOTAL) EVERY MORNING BY MOUTH. 90 tablet 1 11/27/2018 at Unknown time  . traMADol (ULTRAM) 50 MG tablet TAKE 1 TABLET BY MOUTH TWICE A DAY 180 tablet 1 11/27/2018 at Unknown time  . traMADol (ULTRAM-ER) 200 MG 24 hr tablet Take 1 tablet (200 mg total) by mouth daily. 90 tablet 1 11/27/2018 at Unknown time  . vitamin B-12 1000 MCG tablet Take 1 tablet (1,000 mcg total) by mouth daily. 30 tablet 0 11/27/2018 at Unknown time  . warfarin (COUMADIN) 2.5 MG tablet TAKE AS DIRECTED BY COUMADIN CLINIC (Patient taking differently: Take 1.25-2.5 mg by mouth See admin instructions. Take 2.28m by mouth on MON WED FRI SAT, then take 1.221mon TUES THUR SUN) 90 tablet 0 11/27/2018 at 0900  . ACCU-CHEK GUIDE test strip USE UP TO FOUR TIMES DAILY AS DIRECTED 100 strip 0   . blood glucose meter kit and supplies KIT Dispense based on patient and insurance preference. Use up to four times daily as directed. (FOR ICD-9 250.00, 250.01). 1 each 0     Assessment: [age].o. male admitted with SOB/chest pain, h/o Afib, to continue Coumadin. INR Supratherapeutic tonight  Goal of Therapy:  INR 2-3 Monitor platelets by anticoagulation protocol: Yes   Plan:  No Coumadin tonight F/U Daily INR  AbCaryl Pina/13/2020,12:04 AM

## 2018-11-28 NOTE — Progress Notes (Signed)
PROGRESS NOTE                                                                                                                                                                                                             Patient Demographics:    [name], is a 73 y.o. male, DOB - 1945-08-25, QJ:[id number]  Admit date - 11/27/2018   Admitting Physician Orene Desanctis, DO  Outpatient Primary MD for the patient is Debbrah Alar, NP  LOS - 1   Chief Complaint  Patient presents with   Shortness of Breath       Brief Narrative    73 year old with known coronary disease status post CABG with paroxysmal atrial fibrillation on Coumadin therapy with obstructive sleep apnea prior stroke with residual left-sided weakness diabetes with hyperlipidemia and prior diastolic heart failure here with shortness of breath chest discomfort and atrial fibrillation with RVR rates up to 160.  Cardioversion was attempted but unsuccessful.  Was placed on IV diltiazem with improvement of rate.  Troponin 47, 49, INR 4, creatinine 1.38 on admit   Subjective:    [name] today has, No headache, No chest pain, No abdominal pain, reports dyspnea has improved, but still present.   Assessment  & Plan :    Active Problems:   Atrial fibrillation with rapid ventricular response (HCC)   Congestive heart failure (HCC)   Abnormal INR   AKI (acute kidney injury) ([place])   History of gout   History of hypothyroidism   History of type 2 diabetes mellitus   Paroxysmal atrial fibrillation with RVR -Unsuccessful cardioversion in ED, heart rate in the 150s to 160s on presentation, requiring IV Cardizem drip, he remains on 15 mg Cardizem drip this morning, he is not on any beta-blockers as it was previously discontinued in the past secondary to bradycardia. -Is on warfarin for anticoagulation, pharmacy to dose. -We will start on Cardizem 30 mg every 6 hours, hoping will be able  to titrate off Cardizem drip in the next 24 hours, and hopefully can consolidate Cardizem dose.  History of stroke with left-sided hemiplegia -With known history of A. fib, on warfarin for anticoagulation, denies any new focal deficits  Chronic diastolic heart failure -Chronic left lower extremity edema, appears to be a euvolemic, he received 1 dose of IV Lasix yesterday, will continue  with home dose Lasix  Supratherapeutic INR -Chronic anticoagulation with warfarin, INR was 4 on admission, pharmacy to dose.  Obstructive sleep apnea -Encourage CPAP.  Coronary artery disease status post CABG -Stable.  Previously 3 out of the 4 grafts were patent chronic occlusion of SVG to left circumflex.  Cardiac catheterization 2019 reviewed.   AKI Hold benazepril  History of gout -Continue allopurinol ol  History of hypothyroidism -Continue levothyroxine  History of diabetes -Hold metformin - Sensitive sliding scale   Code Status : Full  Family Communication  : D/W patient  Disposition Plan  : Home  Barriers For Discharge : Remains on IV Cardizem for heart rate control  Consults  :  Cardiology  Procedures  : DCCV in ED  DVT Prophylaxis  :  warfarin  Lab Results  Component Value Date   PLT 155 11/28/2018    Antibiotics  :    Anti-infectives (From admission, onward)   None        Objective:   Vitals:   11/28/18 0000 11/28/18 0524 11/28/18 0722 11/28/18 1126  BP:  116/74 109/66 118/82  Pulse:  74 (!) 53 (!) 36  Resp:  15 17 16   Temp:  98 F (36.7 C) (!) 97.5 F (36.4 C) (!) 97.3 F (36.3 C)  TempSrc:  Oral Oral Oral  SpO2:  97% 97% 95%  Weight: 112.7 kg     Height: 6\' 3"  (1.905 m)       Wt Readings from Last 3 Encounters:  11/28/18 112.7 kg  11/12/18 115.7 kg  11/10/18 115.7 kg     Intake/Output Summary (Last 24 hours) at 11/28/2018 1351 Last data filed at 11/28/2018 1300 Gross per 24 hour  Intake 1216.15 ml  Output --  Net 1216.15 ml      Physical Exam  Awake Alert, Oriented X 3, No new F.N deficits, Normal affect Symmetrical Chest wall movement, Good air movement bilaterally, CTAB Regularly irregular no Gallops,Rubs or new Murmurs, No Parasternal Heave +ve B.Sounds, Abd Soft, No tenderness,  No rebound - guarding or rigidity. No Cyanosis, Clubbing , +1 edema left lower extremity which is chronic, No new Rash or bruise      Data Review:    CBC Recent Labs  Lab 11/27/18 1527 11/28/18 0002  WBC 6.6 6.9  HGB 13.1 13.3  HCT 42.3 42.6  PLT 189 155  MCV 90.4 89.1  MCH 28.0 27.8  MCHC 31.0 31.2  RDW 14.3 14.4    Chemistries  Recent Labs  Lab 11/27/18 1527 11/28/18 0002  NA 138 140  K 4.2 4.1  CL 99 100  CO2 27 24  GLUCOSE 147* 107*  BUN 19 18  CREATININE 1.38* 1.32*  CALCIUM 9.3 9.0   ------------------------------------------------------------------------------------------------------------------ No results for input(s): CHOL, HDL, LDLCALC, TRIG, CHOLHDL, LDLDIRECT in the last 72 hours.  Lab Results  Component Value Date   HGBA1C 6.7 (H) 05/11/2018   ------------------------------------------------------------------------------------------------------------------ Recent Labs    11/28/18 0433  TSH 1.822   ------------------------------------------------------------------------------------------------------------------ No results for input(s): VITAMINB12, FOLATE, FERRITIN, TIBC, IRON, RETICCTPCT in the last 72 hours.  Coagulation profile Recent Labs  Lab 11/27/18 1750 11/28/18 0433  INR 4.0* 3.5*    No results for input(s): DDIMER in the last 72 hours.  Cardiac Enzymes No results for input(s): CKMB, TROPONINI, MYOGLOBIN in the last 168 hours.  Invalid input(s): CK ------------------------------------------------------------------------------------------------------------------    Component Value Date/Time   BNP 452.2 (H) 11/27/2018 1750   BNP 82.2 11/05/2015 1532  Inpatient Medications  Scheduled Meds:  allopurinol  200 mg Oral Daily   atorvastatin  40 mg Oral Daily   furosemide  40 mg Oral Daily   [START ON 11/29/2018] influenza vaccine adjuvanted  0.5 mL Intramuscular Tomorrow-1000   insulin aspart  0-9 Units Subcutaneous TID WC   isosorbide mononitrate  15 mg Oral Daily   levothyroxine  112 mcg Oral QAC breakfast   pantoprazole  40 mg Oral q morning - 10a   traMADol  50 mg Oral BID   traMADol  200 mg Oral Daily   cyanocobalamin  1,000 mcg Oral Daily   Warfarin - Pharmacist Dosing Inpatient   Does not apply q1800   Continuous Infusions:  diltiazem (CARDIZEM) infusion 15 mg/hr (11/28/18 0818)   PRN Meds:.  Micro Results Recent Results (from the past 240 hour(s))  SARS Coronavirus 2 [facility] order, Performed in [facility] hospital lab) Nasopharyngeal Nasopharyngeal Swab     Status: None   Collection Time: 11/27/18 11:16 PM   Specimen: Nasopharyngeal Swab  Result Value Ref Range Status   SARS Coronavirus 2 NEGATIVE NEGATIVE Final    Comment: (NOTE) If result is NEGATIVE SARS-CoV-2 target nucleic acids are NOT DETECTED. The SARS-CoV-2 RNA is generally detectable in upper and lower  respiratory specimens during the acute phase of infection. The lowest  concentration of SARS-CoV-2 viral copies this assay can detect is 250  copies / mL. A negative result does not preclude SARS-CoV-2 infection  and should not be used as the sole basis for treatment or other  patient management decisions.  A negative result may occur with  improper specimen collection / handling, submission of specimen other  than nasopharyngeal swab, presence of viral mutation(s) within the  areas targeted by this assay, and inadequate number of viral copies  (<250 copies / mL). A negative result must be combined with clinical  observations, patient history, and epidemiological information. If result is POSITIVE SARS-CoV-2 target nucleic acids are  DETECTED. The SARS-CoV-2 RNA is generally detectable in upper and lower  respiratory specimens dur ing the acute phase of infection.  Positive  results are indicative of active infection with SARS-CoV-2.  Clinical  correlation with patient history and other diagnostic information is  necessary to determine patient infection status.  Positive results do  not rule out bacterial infection or co-infection with other viruses. If result is PRESUMPTIVE POSTIVE SARS-CoV-2 nucleic acids MAY BE PRESENT.   A presumptive positive result was obtained on the submitted specimen  and confirmed on repeat testing.  While 2019 novel coronavirus  (SARS-CoV-2) nucleic acids may be present in the submitted sample  additional confirmatory testing may be necessary for epidemiological  and / or clinical management purposes  to differentiate between  SARS-CoV-2 and other Sarbecovirus currently known to infect humans.  If clinically indicated additional testing with an alternate test  methodology [phone]) is advised. The SARS-CoV-2 RNA is generally  detectable in upper and lower respiratory sp ecimens during the acute  phase of infection. The expected result is Negative. Fact Sheet for Patients:  StrictlyIdeas.no Fact Sheet for Healthcare Providers: BankingDealers.co.za This test is not yet approved or cleared by the [place] FDA and has been authorized for detection and/or diagnosis of SARS-CoV-2 by FDA under an Emergency Use Authorization (EUA).  This EUA will remain in effect (meaning this test can be used) for the duration of the COVID-19 declaration under Section 564(b)(1) of the Act, 21 U.S.C. section 360bbb-3(b)(1), unless the authorization is terminated or revoked  sooner. Performed at [facility], [place] [address], [place], [place] [address]     Radiology Reports Dg Chest 2 View  Result Date: 11/27/2018 CLINICAL DATA:  Shortness of breath.  EXAM: CHEST - 2 VIEW COMPARISON:  02/17/2018 FINDINGS: Stable asymmetric elevation right hemidiaphragm. Right base chronic atelectasis/scarring is similar to prior. No pulmonary edema or focal airspace consolidation. Cardiopericardial silhouette is at upper limits of normal for size. The visualized bony structures of the thorax are intact. IMPRESSION: Stable.  No acute findings. Electronically Signed   By: Misty Stanley M.D.   On: 11/27/2018 16:10   Vas [place] Lower Extremity Venous (dvt) (only Mc & Wl 7a-7p)  Result Date: 11/28/2018  Lower Venous Study Indications: Swelling, SOB, and Patient is on Coumadin.  Risk Factors: Rapid atrial fibrillation. Comparison Study: No prior study on file Performing Technologist: Sharion Dove RVS  Examination Guidelines: A complete evaluation includes B-mode imaging, spectral Doppler, color Doppler, and power Doppler as needed of all accessible portions of each vessel. Bilateral testing is considered an integral part of a complete examination. Limited examinations for reoccurring indications may be performed as noted.  +-----+---------------+---------+-----------+----------+-------------------+  RIGHT Compressibility Phasicity Spontaneity Properties Thrombus Aging       +-----+---------------+---------+-----------+----------+-------------------+  CFV   Full                                             pulsatile waveforms  +-----+---------------+---------+-----------+----------+-------------------+   +---------+---------------+---------+-----------+----------+-------------------+  LEFT      Compressibility Phasicity Spontaneity Properties Thrombus Aging       +---------+---------------+---------+-----------+----------+-------------------+  CFV       Full                                             pulsatile waveforms  +---------+---------------+---------+-----------+----------+-------------------+  SFJ       Full                                                                   +---------+---------------+---------+-----------+----------+-------------------+  FV Prox   Full                                                                  +---------+---------------+---------+-----------+----------+-------------------+  FV Mid    Full                                                                  +---------+---------------+---------+-----------+----------+-------------------+  FV Distal Full                                                                  +---------+---------------+---------+-----------+----------+-------------------+  PFV       Full                                                                  +---------+---------------+---------+-----------+----------+-------------------+  POP       Full                                             pulsatile waveforms  +---------+---------------+---------+-----------+----------+-------------------+  PTV       Full                                                                  +---------+---------------+---------+-----------+----------+-------------------+  PERO      Full                                                                  +---------+---------------+---------+-----------+----------+-------------------+     Summary: Right: No evidence of common femoral vein obstruction. Left: There is no evidence of deep vein thrombosis in the lower extremity.  *See table(s) above for measurements and observations. Electronically signed by Servando Snare MD on 11/28/2018 at 39:37:04 AM.    Final       Phillips Climes M.D on 11/28/2018 at 1:51 PM  Between 7am to 7pm - Pager - [phone]  After 7pm go to [email or web address] - password [email or web address]  [facility] -  Office  [phone]

## 2018-11-28 NOTE — Progress Notes (Signed)
Progress Note  Patient Name: [name] Date of Encounter: 11/28/2018  Primary Cardiologist: Candee Furbish, MD   Subjective   73 year old with known coronary disease status post CABG with paroxysmal atrial fibrillation on Coumadin therapy with obstructive sleep apnea prior stroke with residual left-sided weakness diabetes with hyperlipidemia and prior diastolic heart failure here with shortness of breath chest discomfort and atrial fibrillation with RVR rates up to 160.  Cardioversion was attempted but unsuccessful.  Was placed on IV diltiazem with improvement of rate.  Troponin 47, 49, INR 4, creatinine 1.38 on admit  After improved rate control, he was in proved clinically as well.  Left greater than right lower extremity edema.  Wears TED hose.  He remembers washing his car and starting to feel little funny on Wednesday.  I wonder if this is when he went into atrial fibrillation with rapid ventricular response.  Inpatient Medications    Scheduled Meds:  allopurinol  200 mg Oral Daily   atorvastatin  40 mg Oral Daily   furosemide  40 mg Oral Daily   insulin aspart  0-9 Units Subcutaneous TID WC   isosorbide mononitrate  15 mg Oral Daily   levothyroxine  112 mcg Oral QAC breakfast   pantoprazole  40 mg Oral q morning - 10a   cyanocobalamin  1,000 mcg Oral Daily   Warfarin - Pharmacist Dosing Inpatient   Does not apply q1800   Continuous Infusions:  diltiazem (CARDIZEM) infusion 15 mg/hr (11/28/18 0818)   PRN Meds:    Vital Signs    Vitals:   11/27/18 2356 11/28/18 0000 11/28/18 0524 11/28/18 0722  BP: (!) 113/91  116/74 109/66  Pulse: 64  74 (!) 53  Resp: 15  15 17   Temp: 97.8 F (36.6 C)  98 F (36.7 C) (!) 97.5 F (36.4 C)  TempSrc: Oral  Oral Oral  SpO2: 94%  97% 97%  Weight:  112.7 kg    Height:  6\' 3"  (1.905 m)      Intake/Output Summary (Last 24 hours) at 11/28/2018 0945 Last data filed at 11/28/2018 0818 Gross per 24 hour  Intake 785.79 ml    Output --  Net 785.79 ml   Last 3 Weights 11/28/2018 11/12/2018 11/10/2018  Weight (lbs) 248 lb 6.4 oz 255 lb 255 lb  Weight (kg) 112.674 kg 115.667 kg 115.667 kg      Telemetry    Atrial fibrillation ranging from 160 down to 100- Personally Reviewed  ECG    Atrial fibrillation with rapid ventricular response- Personally Reviewed  Physical Exam   GEN: No acute distress.   Neck: No JVD Cardiac:  Irregularly irregular, mildly tachycardic, no murmurs, rubs, or gallops.  Respiratory: Clear to auscultation bilaterally. GI: Soft, nontender, non-distended  MS: No edema; No deformity. Neuro:  Nonfocal  Psych: Normal affect   Labs    High Sensitivity Troponin:   Recent Labs  Lab 11/27/18 1527 11/27/18 1750  TROPONINIHS 47* 49*      Chemistry Recent Labs  Lab 11/27/18 1527 11/28/18 0002  NA 138 140  K 4.2 4.1  CL 99 100  CO2 27 24  GLUCOSE 147* 107*  BUN 19 18  CREATININE 1.38* 1.32*  CALCIUM 9.3 9.0  GFRNONAA 50* 52*  GFRAA 58* >60  ANIONGAP 12 16*     Hematology Recent Labs  Lab 11/27/18 1527 11/28/18 0002  WBC 6.6 6.9  RBC 4.68 4.78  HGB 13.1 13.3  HCT 42.3 42.6  MCV 90.4 89.1  MCH 28.0 27.8  MCHC 31.0 31.2  RDW 14.3 14.4  PLT 189 155    BNP Recent Labs  Lab 11/27/18 1750  BNP 452.2*     DDimer No results for input(s): DDIMER in the last 168 hours.   Radiology    Dg Chest 2 View  Result Date: 11/27/2018 CLINICAL DATA:  Shortness of breath. EXAM: CHEST - 2 VIEW COMPARISON:  02/17/2018 FINDINGS: Stable asymmetric elevation right hemidiaphragm. Right base chronic atelectasis/scarring is similar to prior. No pulmonary edema or focal airspace consolidation. Cardiopericardial silhouette is at upper limits of normal for size. The visualized bony structures of the thorax are intact. IMPRESSION: Stable.  No acute findings. Electronically Signed   By: Misty Stanley M.D.   On: 11/27/2018 16:10   Vas [place] Lower Extremity Venous (dvt) (only Mc & Wl  7a-7p)  Result Date: 11/27/2018  Lower Venous Study Indications: Swelling, SOB, and Patient is on Coumadin.  Risk Factors: Rapid atrial fibrillation. Comparison Study: No prior study on file Performing Technologist: Sharion Dove RVS  Examination Guidelines: A complete evaluation includes B-mode imaging, spectral Doppler, color Doppler, and power Doppler as needed of all accessible portions of each vessel. Bilateral testing is considered an integral part of a complete examination. Limited examinations for reoccurring indications may be performed as noted.  +-----+---------------+---------+-----------+----------+-------------------+  RIGHT Compressibility Phasicity Spontaneity Properties Thrombus Aging       +-----+---------------+---------+-----------+----------+-------------------+  CFV   Full                                             pulsatile waveforms  +-----+---------------+---------+-----------+----------+-------------------+   +---------+---------------+---------+-----------+----------+-------------------+  LEFT      Compressibility Phasicity Spontaneity Properties Thrombus Aging       +---------+---------------+---------+-----------+----------+-------------------+  CFV       Full                                             pulsatile waveforms  +---------+---------------+---------+-----------+----------+-------------------+  SFJ       Full                                                                  +---------+---------------+---------+-----------+----------+-------------------+  FV Prox   Full                                                                  +---------+---------------+---------+-----------+----------+-------------------+  FV Mid    Full                                                                  +---------+---------------+---------+-----------+----------+-------------------+  FV Distal Full                                                                   +---------+---------------+---------+-----------+----------+-------------------+  PFV       Full                                                                  +---------+---------------+---------+-----------+----------+-------------------+  POP       Full                                             pulsatile waveforms  +---------+---------------+---------+-----------+----------+-------------------+  PTV       Full                                                                  +---------+---------------+---------+-----------+----------+-------------------+  PERO      Full                                                                  +---------+---------------+---------+-----------+----------+-------------------+     Summary: Right: No evidence of common femoral vein obstruction. Left: There is no evidence of deep vein thrombosis in the lower extremity.  *See table(s) above for measurements and observations.    Preliminary     Cardiac Studies   Most recent echo with normal EF  Patient Profile     73 y.o. male with paroxysmal atrial fibrillation with rapid ventricular response  Assessment & Plan    Paroxysmal atrial fibrillation -Unsuccessful cardioversion in ER. -Good overall rate control with addition of IV diltiazem.  Heart rate currently about 100 on telemetry - Home medications, I do not see any rate controlling agents.  Beta-blocker was previously discontinued because of bradycardia.  Lopressor. -When able, we will convert to p.o. diltiazem.  I think we should now try to give him diltiazem chronically to see if we can ward off atrial fibrillation.   History of stroke with left-sided hemiplegia -Aggressive secondary risk factor prevention  Chronic diastolic heart failure - Mild fluid retention in his left lower extremity which seems to be chronic.  He received 1 dose of Lasix.  Feeling better.  Chronic anticoagulation -Warfarin.  INR was 4 on admit.  Obstructive sleep  apnea -Encourage CPAP.  Coronary artery disease status post CABG -Stable.  Previously 3 out of the 4 grafts were patent chronic occlusion of SVG to left circumflex.  Cardiac catheterization 2019 reviewed.     For questions or updates, please contact [place] Please consult [email or web address] for contact info under        Signed, Candee Furbish, MD  11/28/2018, 9:45 AM

## 2018-11-29 DIAGNOSIS — E669 Obesity, unspecified: Secondary | ICD-10-CM

## 2018-11-29 DIAGNOSIS — G4733 Obstructive sleep apnea (adult) (pediatric): Secondary | ICD-10-CM

## 2018-11-29 DIAGNOSIS — I5032 Chronic diastolic (congestive) heart failure: Secondary | ICD-10-CM

## 2018-11-29 DIAGNOSIS — Z7901 Long term (current) use of anticoagulants: Secondary | ICD-10-CM

## 2018-11-29 DIAGNOSIS — E1169 Type 2 diabetes mellitus with other specified complication: Secondary | ICD-10-CM

## 2018-11-29 DIAGNOSIS — I25810 Atherosclerosis of coronary artery bypass graft(s) without angina pectoris: Secondary | ICD-10-CM

## 2018-11-29 LAB — HEMOGLOBIN A1C
Hgb A1c MFr Bld: 6.9 % — ABNORMAL HIGH (ref 4.8–5.6)
Mean Plasma Glucose: 151 mg/dL

## 2018-11-29 LAB — GLUCOSE, CAPILLARY
Glucose-Capillary: 143 mg/dL — ABNORMAL HIGH (ref 70–99)
Glucose-Capillary: 155 mg/dL — ABNORMAL HIGH (ref 70–99)
Glucose-Capillary: 139 mg/dL — ABNORMAL HIGH (ref 70–99)

## 2018-11-29 LAB — BASIC METABOLIC PANEL
Anion gap: 10 (ref 5–15)
BUN: 18 mg/dL (ref 8–23)
CO2: 27 mmol/L (ref 22–32)
Calcium: 8.7 mg/dL — ABNORMAL LOW (ref 8.9–10.3)
Chloride: 100 mmol/L (ref 98–111)
Creatinine, Ser: 1.27 mg/dL — ABNORMAL HIGH (ref 0.61–1.24)
GFR calc Af Amer: >60 mL/min (ref >60)
GFR calc non Af Amer: 55 mL/min — ABNORMAL LOW (ref >60)
Glucose, Bld: 131 mg/dL — ABNORMAL HIGH (ref 70–99)
Potassium: 3.6 mmol/L (ref 3.5–5.1)
Sodium: 137 mmol/L (ref 135–145)

## 2018-11-29 LAB — PROTIME-INR
INR: 3.6 — ABNORMAL HIGH (ref 0.8–1.2)
Prothrombin Time: 35.4 seconds — ABNORMAL HIGH (ref 11.4–15.2)

## 2018-11-29 LAB — CBC
HCT: 37.7 % — ABNORMAL LOW (ref 39.0–52.0)
Hemoglobin: 11.5 g/dL — ABNORMAL LOW (ref 13.0–17.0)
MCH: 27.4 pg (ref 26.0–34.0)
MCHC: 30.5 g/dL (ref 30.0–36.0)
MCV: 90 fL (ref 80.0–100.0)
Platelets: 165 10*3/uL (ref 150–400)
RBC: 4.19 MIL/uL — ABNORMAL LOW (ref 4.22–5.81)
RDW: 14.4 % (ref 11.5–15.5)
WBC: 6.6 10*3/uL (ref 4.0–10.5)
nRBC: 0 % (ref 0.0–0.2)

## 2018-11-29 MED ORDER — SENNOSIDES-DOCUSATE SODIUM 8.6-50 MG PO TABS: 2.0000 | ORAL_TABLET | Freq: Two times a day (BID) | ORAL | Status: DC

## 2018-11-29 MED ORDER — DILTIAZEM HCL ER COATED BEADS 180 MG PO CP24: 180.0000 mg | ORAL_CAPSULE | Freq: Every day | ORAL | 1 refills | Status: DC

## 2018-11-29 MED ORDER — DILTIAZEM HCL ER COATED BEADS 180 MG PO CP24
180.0000 mg | ORAL_CAPSULE | Freq: Every day | ORAL | Status: DC
  Administered 2018-11-29: 180 mg via ORAL
  Filled 2018-11-29: qty 1

## 2018-11-29 MED ORDER — WARFARIN SODIUM 2.5 MG PO TABS: 1.2500 mg | ORAL_TABLET | ORAL | Status: DC

## 2018-11-29 MED ORDER — BENAZEPRIL HCL 5 MG PO TABS: 5.0000 mg | ORAL_TABLET | Freq: Every day | ORAL | Status: DC

## 2018-11-29 NOTE — Discharge Instructions (Signed)

## 2018-11-29 NOTE — Progress Notes (Signed)
Pt has dc orders, called wife and she will be here at 1730 to pick up patient, dc lounge 5c called and pt appropriate for the unit, telemetry box removed and CCMD notified of dc, NT assisting getting pt dressed

## 2018-11-29 NOTE — Progress Notes (Signed)
ANTICOAGULATION CONSULT NOTE - Follow Up Consult  Pharmacy Consult for Coumadin Indication: atrial fibrillation  No Known Allergies  Patient Measurements: Height: 6\' 3"  (190.5 cm) Weight: 249 lb 8 oz (113.2 kg) IBW/kg (Calculated) : 84.5  Vital Signs: Temp: 98.5 F (36.9 C) (09/14 0858) Temp Source: Oral (09/14 0858) BP: 134/85 (09/14 0858) Pulse Rate: 91 (09/14 0858)  Labs: Recent Labs    11/27/18 1527 11/27/18 1750 11/28/18 0002 11/28/18 0433 11/29/18 0600  HGB 13.1  --  13.3  --  11.5*  HCT 42.3  --  42.6  --  37.7*  PLT 189  --  155  --  165  LABPROT  --  38.6*  --  34.6* 35.4*  INR  --  4.0*  --  3.5* 3.6*  CREATININE 1.38*  --  1.32*  --  1.27*  TROPONINIHS 47* 49*  --   --   --     Estimated Creatinine Clearance: 68.2 mL/min (A) (by C-G formula based on SCr of 1.27 mg/dL (H)).  Assessment:  Anticoag: Warfarin PTA for A-fib/CVA. INR 3.6. Hgb 13.3>11.5. Plts stable. - PTA warfarin 2.5 mg MWFSat, and 1.25 mg on TuThurs   Goal of Therapy:  INR 2-3 Monitor platelets by anticoagulation protocol: Yes   Plan:  - Continue to hold [place]. Alford Highland, PharmD, BCPS Clinical Staff Pharmacist Eilene Ghazi Stillinger 11/29/2018,11:36 AM

## 2018-11-29 NOTE — Discharge Summary (Signed)
[name], is a 73 y.o. male  DOB 1945-12-12  MRN [id number].  Admission date:  11/27/2018  Admitting Physician  Orene Desanctis, DO  Discharge Date:  11/29/2018   Primary MD  Debbrah Alar, NP  Recommendations for primary care physician for things to follow:  -Please check CBC, BMP during next visit, continue to monitor INR and adjust warfarin dose as needed   Admission Diagnosis  Atrial fibrillation with rapid ventricular response ([place]) [I48.91] Congestive heart failure, unspecified HF chronicity, unspecified heart failure type ([place]) [I50.9]   Discharge Diagnosis  Atrial fibrillation with rapid ventricular response ([place]) [I48.91] Congestive heart failure, unspecified HF chronicity, unspecified heart failure type ([place]) [I50.9]    Active Problems:   Atrial fibrillation with rapid ventricular response (HCC)   Congestive heart failure (HCC)   Abnormal INR   AKI (acute kidney injury) ([place])   History of gout   History of hypothyroidism   History of type 2 diabetes mellitus      Past Medical History:  Diagnosis Date   Acute encephalopathy 12/12/2015   Acute respiratory failure with hypoxia and hypercapnia ([place]) 12/12/2015   Allergic rhinitis 02/13/2010   Qualifier: Diagnosis of  By: Wynona Luna    Angina at rest [facility]) 10/22/2015   Ataxia 08/10/2013   B12 deficiency 08/11/2013   BACK PAIN, LUMBAR 01/29/2009   Qualifier: Diagnosis of  By: Wynona Luna    BENIGN POSITIONAL VERTIGO 11/23/2009   Qualifier: Diagnosis of  By: Wynona Luna    Chronic diastolic heart failure ([place]) 04/24/2015   Chronic pain    left sided-[place]   Coronary 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 ([place]) 11/04/2015   s/p CABG 2004; CFX DES 2011   Essential hypertension 12/12/2006   Qualifier: Diagnosis of  By: [name] RMA, [place]     GERD 12/12/2006   Qualifier: Diagnosis of   By: [name] RMA, Lucy     GOUT 12/12/2006   Qualifier: Diagnosis of  By: [name] RMA, Lucy     Hyperlipidemia LDL goal <70 12/12/2006   Qualifier: Diagnosis of  By: [name] RMA, [place]      Hypogonadism male 04/10/2011   Hypothyroidism, acquired 02/15/2013   Insomnia 05/21/2013   Long term current use of anticoagulant therapy 06/24/2011   Obstructive sleep apnea-Failed CPAP 06/17/2007   Failed CPAP Using O 2 for sleep    Paroxysmal atrial fibrillation ([place]) 12/06/2016   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   also 1996; resultant hemiplegia of dominant side   Thoracic aorta atherosclerosis ([place]) 04/27/2017   Tubular adenoma of colon 05/2010   Type 2 diabetes mellitus without complication, without long-term current use of insulin ([place]) 10/05/2007   Qualifier: Diagnosis of  By: Wynona Luna    Vasovagal syncope     Past Surgical History:  Procedure Laterality Date   CARDIAC CATHETERIZATION N/A 10/25/2015   Procedure: Left Heart Cath and Cors/Grafts Angiography;  Surgeon: Troy Sine, MD;  Location: [facility] INVASIVE CV  LAB;  Service: Cardiovascular;  Laterality: N/A;   CARDIOVERSION  06/20/2011   Procedure: CARDIOVERSION;  Surgeon: Larey Dresser, MD;  Location: [facility] ENDOSCOPY;  Service: Cardiovascular;  Laterality: N/A;   CARDIOVERSION N/A 04/26/2015   Procedure: CARDIOVERSION;  Surgeon: Dorothy Spark, MD;  Location: [facility] ENDOSCOPY;  Service: Cardiovascular;  Laterality: N/A;   CARDIOVERSION N/A 05/10/2015   Procedure: CARDIOVERSION;  Surgeon: Larey Dresser, MD;  Location: [place];  Service: Cardiovascular;  Laterality: N/A;   CATARACT EXTRACTION  05/2010   left eye   CATARACT EXTRACTION  04/2010   right eye   CORONARY ARTERY BYPASS GRAFT     stent   ESOPHAGOGASTRODUODENOSCOPY  03-25-2005   LEFT HEART CATH AND CORS/GRAFTS ANGIOGRAPHY N/A 02/19/2018   Procedure: LEFT HEART CATH AND CORS/GRAFTS ANGIOGRAPHY;  Surgeon: Martinique, Peter M, MD;  Location: [place] CV LAB;  Service:  Cardiovascular;  Laterality: N/A;   NASAL SEPTUM SURGERY     PERCUTANEOUS PLACEMENT INTRAVASCULAR STENT CERVICAL CAROTID ARTERY     03-2009; using a drug-eluting platform of the circumflex cornoray artery with a 3.0 x 18 [employer] Promus drug-eluting platform post dilated to 3.75 with a noncompliant balloon.   TEE WITHOUT CARDIOVERSION  06/20/2011   Procedure: TRANSESOPHAGEAL ECHOCARDIOGRAM (TEE);  Surgeon: Larey Dresser, MD;  Location: [place];  Service: Cardiovascular;  Laterality: N/A;       History of present illness and  Hospital Course:     Kindly see H&P for history of present illness and admission details, please review complete Labs, Consult reports and Test reports for all details in brief  HPI  from the history and physical done on the day of admission 11/27/2018  HPI: [name] is a 73 y.o. male with medical history significant of Congestive heart failure, persistent atrial fibrillation on Coumadin, hypertension, CVA with residual left-sided deficit, OSA on nighttime oxygen 2 L who presented with worsening shortness of breath and chest pain that started last night at rest.  Patient also endorsed orthopnea and paroxysmal nocturnal dyspnea.  He endorses being compliant with his medications.  Reports that he has a very strict low-sodium and low sugar diet.  He does endorse chronic edema to his left foot.  Denies any fever, cough or runny nose.  Denies any nausea, vomiting diarrhea abdominal pain.  Denies any urinary dysuria, frequency or urgency.  ED Course: Patient was afebrile and found to be in atrial fibrillation with RVR with rates up to 160 and blood pressure of 120 over 90s.  Cardioversion was attempted but was unsuccessful.  He was then placed on IV diltiazem drip with improvement of his rates down to the 90s to 110.  Cardiology was consulted by EDP and he was given a dose of IV 88m Lasix.CBC showed no leukocytosis or anemia.  BMP of 1.38 with baseline of  around 1.3.  Troponin at 47 and then 49.  BUN of 452.  INR was elevated to 4.  Urinalysis was negative. CXR was negative.   Hospital Course   73year old with known coronary disease status post CABG with paroxysmal atrial fibrillation on Coumadin therapy with obstructive sleep apnea prior stroke with residual left-sided weakness diabetes with hyperlipidemia and prior diastolic heart failure here with shortness of breath chest discomfort and atrial fibrillation with RVR rates up to 160. Cardioversion was attempted but unsuccessful. Was placed on IV diltiazem with improvement of rate. Troponin 47, 49, INR 4, creatinine 1.38 on admit , patient was started on Cardizem drip,  bridged with p.o. Cardizem, heart rate is controlled this morning, he will be discharged on Cardizem CD.  Paroxysmal atrial fibrillation with RVR -Unsuccessful cardioversion in ED, heart rate in the 150s to 160s on presentation, requiring IV Cardizem drip, he is not on any beta-blockers as it was previously discontinued in the past secondary to bradycardia. -Cardiology input greatly appreciated, per cards not a lot of good options for return and maintenance of SRdue to underlying CAD/CHF and prolonged QT. Switch to long acting diltiazem for rate control.  He is currently on Cardizem CD 180 mg oral daily with good heart rate control -Is on warfarin for anticoagulation. History of stroke with left-sided hemiplegia -With known history of A. fib, on warfarin for anticoagulation, denies any new focal deficits  Chronic diastolic heart failure -Chronic left lower extremity edema, appears to be a euvolemic, he received IV Lasix on presentation, continue with home dose Lasix on discharge Supratherapeutic INR -Chronic anticoagulation with warfarin, INR was 4 on admission, this morning is 3.6, to hold warfarin today and to resume tomorrow  Obstructive sleep apnea -Encourage CPAP.  Hypertension -Blood pressure has been acceptable  after starting Cardizem CD, so he will be resumed on his home dose Imdur, and will decrease his benazepril dose from 15 mg to 5 mg oral daily given he is started on Cardizem CD 180 mg oral daily.  Coronary artery disease status post CABG -Stable. Previously 3 out of the 4 grafts were patent chronic occlusion of SVG to left circumflex. Cardiac catheterization 2019 reviewed.  AKI Creatinine 1.27 on discharge  History of gout -Continue allopurinol ol  History of hypothyroidism -Continue levothyroxine  History of diabetes -Continue metformin   Discharge Condition:  stable  Follow UP  Follow-up Information    Debbrah Alar, NP Follow up.   Specialty: Internal Medicine Contact information: [place] [address] [phone]        Jerline Pain, MD .   Specialty: Cardiology Contact information: [address] [place] [address] [phone]             Discharge Instructions  and  Discharge Medications     Discharge Instructions    Discharge instructions   Complete by: As directed    Follow with Primary MD Debbrah Alar, NP in 7 days   Get CBC, CMP,checked  by Primary MD next visit.    Activity: As tolerated with Full fall precautions use walker/cane & assistance as needed   Disposition Home    Diet: Heart Healthy ,carb modiifed, with feeding assistance and aspiration precautions.  For Heart failure patients - Check your Weight same time everyday, if you gain over 2 pounds, or you develop in leg swelling, experience more shortness of breath or chest pain, call your Primary MD immediately. Follow Cardiac Low Salt Diet and 1.5 lit/day fluid restriction.   On your next visit with your primary care physician please Get Medicines reviewed and adjusted.   Please request your Prim.MD to go over all Hospital Tests and Procedure/Radiological results at the follow up, please get all Hospital  records sent to your Prim MD by signing hospital release before you go home.   If you experience worsening of your admission symptoms, develop shortness of breath, life threatening emergency, suicidal or homicidal thoughts you must seek medical attention immediately by calling 911 or calling your MD immediately  if symptoms less severe.  You Must read complete instructions/literature along with all the  possible adverse reactions/side effects for all the Medicines you take and that have been prescribed to you. Take any new Medicines after you have completely understood and accpet all the possible adverse reactions/side effects.   Do not drive, operating heavy machinery, perform activities at heights, swimming or participation in water activities or provide baby sitting services if your were admitted for syncope or siezures until you have seen by Primary MD or a Neurologist and advised to do so again.  Do not drive when taking Pain medications.    Do not take more than prescribed Pain, Sleep and Anxiety Medications  Special Instructions: If you have smoked or chewed Tobacco  in the last 2 yrs please stop smoking, stop any regular Alcohol  and or any Recreational drug use.  Wear Seat belts while driving.   Please note  You were cared for by a hospitalist during your hospital stay. If you have any questions about your discharge medications or the care you received while you were in the hospital after you are discharged, you can call the unit and asked to speak with the hospitalist on call if the hospitalist that took care of you is not available. Once you are discharged, your primary care physician will handle any further medical issues. Please note that NO REFILLS for any discharge medications will be authorized once you are discharged, as it is imperative that you return to your primary care physician (or establish a relationship with a primary care physician if you do not have one) for your  aftercare needs so that they can reassess your need for medications and monitor your lab values.   Increase activity slowly   Complete by: As directed      Allergies as of 11/29/2018   No Known Allergies     Medication List    TAKE these medications   Accu-Chek Guide test strip Generic drug: glucose blood USE UP TO FOUR TIMES DAILY AS DIRECTED   allopurinol 100 MG tablet Commonly known as: ZYLOPRIM TAKE 2 TABLETS BY MOUTH EVERY DAY   atorvastatin 40 MG tablet Commonly known as: LIPITOR Take 1 tablet (40 mg total) by mouth daily.   benazepril 5 MG tablet Commonly known as: LOTENSIN Take 1 tablet (5 mg total) by mouth daily. What changed:   See the new instructions.  Another medication with the same name was removed. Continue taking this medication, and follow the directions you see here.   blood glucose meter kit and supplies Kit Dispense based on patient and insurance preference. Use up to four times daily as directed. (FOR ICD-9 250.00, 250.01).   cyanocobalamin 1000 MCG tablet Take 1 tablet (1,000 mcg total) by mouth daily.   diltiazem 180 MG 24 hr capsule Commonly known as: CARDIZEM CD Take 1 capsule (180 mg total) by mouth daily. Start taking on: November 30, 2018   fluocinonide gel 0.05 % Commonly known as: LIDEX Apply to scalp once daily as needed What changed:   how much to take  how to take this  when to take this  reasons to take this  additional instructions   fluticasone 50 MCG/ACT nasal spray Commonly known as: FLONASE Place 2 sprays into both nostrils daily.   furosemide 40 MG tablet Commonly known as: LASIX TAKE 1 TABLET BY MOUTH EVERY OTHER DAY   isosorbide mononitrate 30 MG 24 hr tablet Commonly known as: IMDUR TAKE 1/2 TABLET BY MOUTH EVERY DAY   levothyroxine 112 MCG tablet Commonly known as: SYNTHROID TAKE 1  TABLET (112 MCG TOTAL) BY MOUTH DAILY BEFORE BREAKFAST.   metFORMIN 500 MG tablet Commonly known as:  GLUCOPHAGE TAKE 1 TABLET (500 MG TOTAL) DAILY WITH BREAKFAST BY MOUTH.   MULTIVITAMIN GUMMIES MENS PO Take 1 tablet by mouth daily with breakfast.   nitroGLYCERIN 0.4 MG SL tablet Commonly known as: NITROSTAT Place 1 tablet (0.4 mg total) under the tongue every 5 (five) minutes as needed for chest pain. Up to 3 doses   OXYGEN Inhale 2 L into the lungs at bedtime.   pantoprazole 40 MG tablet Commonly known as: PROTONIX TAKE 1 TABLET (40 MG TOTAL) EVERY MORNING BY MOUTH.   senna-docusate 8.6-50 MG tablet Commonly known as: Senokot-S Take 2 tablets by mouth 2 (two) times daily.   traMADol 50 MG tablet Commonly known as: ULTRAM TAKE 1 TABLET BY MOUTH TWICE A DAY   traMADol 200 MG 24 hr tablet Commonly known as: ULTRAM-ER Take 1 tablet (200 mg total) by mouth daily.   warfarin 2.5 MG tablet Commonly known as: COUMADIN Take as directed. If you are unsure how to take this medication, talk to your nurse or doctor. Original instructions: Take 0.5-1 tablets (1.25-2.5 mg total) by mouth See admin instructions. Take 2.65m by mouth on MON WED FRI SAT, then take 1.245mon TUPalmeroday and resume 9/16. Start taking on: November 30, 2018 What changed: See the new instructions.         Diet and Activity recommendation: See Discharge Instructions above   Consults obtained - cardiology   Major procedures and Radiology Reports - PLEASE review detailed and final reports for all details, in brief -      Dg Chest 2 View  Result Date: 11/27/2018 CLINICAL DATA:  Shortness of breath. EXAM: CHEST - 2 VIEW COMPARISON:  02/17/2018 FINDINGS: Stable asymmetric elevation right hemidiaphragm. Right base chronic atelectasis/scarring is similar to prior. No pulmonary edema or focal airspace consolidation. Cardiopericardial silhouette is at upper limits of normal for size. The visualized bony structures of the thorax are intact. IMPRESSION: Stable.  No acute findings. Electronically  Signed   By: ErMisty Stanley.D.   On: 11/27/2018 16:10   Vas Us[place]ower Extremity Venous (dvt) (only Mc & Wl 7a-7p)  Result Date: 11/28/2018  Lower Venous Study Indications: Swelling, SOB, and Patient is on Coumadin.  Risk Factors: Rapid atrial fibrillation. Comparison Study: No prior study on file Performing Technologist: CaSharion DoveVS  Examination Guidelines: A complete evaluation includes B-mode imaging, spectral Doppler, color Doppler, and power Doppler as needed of all accessible portions of each vessel. Bilateral testing is considered an integral part of a complete examination. Limited examinations for reoccurring indications may be performed as noted.  +-----+---------------+---------+-----------+----------+-------------------+  RIGHT Compressibility Phasicity Spontaneity Properties Thrombus Aging       +-----+---------------+---------+-----------+----------+-------------------+  CFV   Full                                             pulsatile waveforms  +-----+---------------+---------+-----------+----------+-------------------+   +---------+---------------+---------+-----------+----------+-------------------+  LEFT      Compressibility Phasicity Spontaneity Properties Thrombus Aging       +---------+---------------+---------+-----------+----------+-------------------+  CFV       Full  pulsatile waveforms  +---------+---------------+---------+-----------+----------+-------------------+  SFJ       Full                                                                  +---------+---------------+---------+-----------+----------+-------------------+  FV Prox   Full                                                                  +---------+---------------+---------+-----------+----------+-------------------+  FV Mid    Full                                                                   +---------+---------------+---------+-----------+----------+-------------------+  FV Distal Full                                                                  +---------+---------------+---------+-----------+----------+-------------------+  PFV       Full                                                                  +---------+---------------+---------+-----------+----------+-------------------+  POP       Full                                             pulsatile waveforms  +---------+---------------+---------+-----------+----------+-------------------+  PTV       Full                                                                  +---------+---------------+---------+-----------+----------+-------------------+  PERO      Full                                                                  +---------+---------------+---------+-----------+----------+-------------------+     Summary: Right: No evidence of common femoral vein obstruction. Left: There is no evidence of deep vein thrombosis in the  lower extremity.  *See table(s) above for measurements and observations. Electronically signed by Servando Snare MD on 11/28/2018 at 71:37:04 AM.    Final     Micro Results    Recent Results (from the past 240 hour(s))  SARS Coronavirus 2 [facility] order, Performed in [facility] hospital lab) Nasopharyngeal Nasopharyngeal Swab     Status: None   Collection Time: 11/27/18 11:16 PM   Specimen: Nasopharyngeal Swab  Result Value Ref Range Status   SARS Coronavirus 2 NEGATIVE NEGATIVE Final    Comment: (NOTE) If result is NEGATIVE SARS-CoV-2 target nucleic acids are NOT DETECTED. The SARS-CoV-2 RNA is generally detectable in upper and lower  respiratory specimens during the acute phase of infection. The lowest  concentration of SARS-CoV-2 viral copies this assay can detect is 250  copies / mL. A negative result does not preclude SARS-CoV-2 infection  and should not be used as the sole basis for treatment or  other  patient management decisions.  A negative result may occur with  improper specimen collection / handling, submission of specimen other  than nasopharyngeal swab, presence of viral mutation(s) within the  areas targeted by this assay, and inadequate number of viral copies  (<250 copies / mL). A negative result must be combined with clinical  observations, patient history, and epidemiological information. If result is POSITIVE SARS-CoV-2 target nucleic acids are DETECTED. The SARS-CoV-2 RNA is generally detectable in upper and lower  respiratory specimens dur ing the acute phase of infection.  Positive  results are indicative of active infection with SARS-CoV-2.  Clinical  correlation with patient history and other diagnostic information is  necessary to determine patient infection status.  Positive results do  not rule out bacterial infection or co-infection with other viruses. If result is PRESUMPTIVE POSTIVE SARS-CoV-2 nucleic acids MAY BE PRESENT.   A presumptive positive result was obtained on the submitted specimen  and confirmed on repeat testing.  While 2019 novel coronavirus  (SARS-CoV-2) nucleic acids may be present in the submitted sample  additional confirmatory testing may be necessary for epidemiological  and / or clinical management purposes  to differentiate between  SARS-CoV-2 and other Sarbecovirus currently known to infect humans.  If clinically indicated additional testing with an alternate test  methodology [phone]) is advised. The SARS-CoV-2 RNA is generally  detectable in upper and lower respiratory sp ecimens during the acute  phase of infection. The expected result is Negative. Fact Sheet for Patients:  StrictlyIdeas.no Fact Sheet for Healthcare Providers: BankingDealers.co.za This test is not yet approved or cleared by the [place] FDA and has been authorized for detection and/or diagnosis of  SARS-CoV-2 by FDA under an Emergency Use Authorization (EUA).  This EUA will remain in effect (meaning this test can be used) for the duration of the COVID-19 declaration under Section 564(b)(1) of the Act, 21 U.S.C. section 360bbb-3(b)(1), unless the authorization is terminated or revoked sooner. Performed at [facility], [employer] [address] [place], [place] [address]        Today   Subjective:   Kemoni Ortega today has no headache,no chest or abdominal pain,no new weakness tingling or numbness, feels much better wants to go home today.   Objective:   Blood pressure 113/71, pulse (!) 57, temperature (!) 97.5 F (36.4 C), temperature source Oral, resp. rate 18, height '6\' 3"'$  (1.905 m), weight 113.2 kg, SpO2 92 %.   Intake/Output Summary (Last 24 hours) at 11/29/2018 1436 Last data filed at 11/29/2018 1254 Gross  per 24 hour  Intake 881.46 ml  Output 1265 ml  Net -383.54 ml    Exam Awake Alert, Oriented x 3, patient is easily distracted, mildly confused (this is his baseline per wife) Symmetrical Chest wall movement, Good air movement bilaterally, CTAB iregular irregular,No Gallops,Rubs or new Murmurs, No Parasternal Heave +ve B.Sounds, Abd Soft, Non tender, No rebound -guarding or rigidity. No Cyanosis, Clubbing , chronic +1 left lower extremity edema, No new Rash or bruise  Data Review   CBC w Diff:  Lab Results  Component Value Date   WBC 6.6 11/29/2018   HGB 11.5 (L) 11/29/2018   HCT 37.7 (L) 11/29/2018   PLT 165 11/29/2018   LYMPHOPCT 34 01/13/2017   MONOPCT 8.4 04/27/2017   EOSPCT 5.7 04/27/2017   BASOPCT 2.3 04/27/2017    CMP:  Lab Results  Component Value Date   NA 137 11/29/2018   NA 143 09/18/2016   K 3.6 11/29/2018   CL 100 11/29/2018   CO2 27 11/29/2018   BUN 18 11/29/2018   BUN 14 09/18/2016   CREATININE 1.27 (H) 11/29/2018   CREATININE 1.27 (H) 11/05/2015   PROT 6.9 01/27/2018   PROT 6.6 09/18/2016   ALBUMIN 4.2 01/27/2018   ALBUMIN  4.1 09/18/2016   BILITOT 0.6 01/27/2018   BILITOT 0.4 09/18/2016   ALKPHOS 60 01/27/2018   AST 40 (H) 01/27/2018   ALT 44 01/27/2018  .   Total Time in preparing paper work, data evaluation and todays exam - 4 minutes  Phillips Climes M.D on 11/29/2018 at 2:36 PM  [facility]   Office  [phone]

## 2018-11-29 NOTE — Progress Notes (Signed)
Progress Note  Patient Name: [name] Date of Encounter: 11/29/2018  Primary Cardiologist: Candee Furbish, MD   Subjective   Appears mildly confused. Wants to sign out and leave. Breathing comfortably. Rate is fairly well controlled (70s at night, 90s after waking up, max 110 going to restroom).  Inpatient Medications    Scheduled Meds: . allopurinol  200 mg Oral Daily  . atorvastatin  40 mg Oral Daily  . diltiazem  30 mg Oral Q6H  . furosemide  40 mg Oral Daily  . influenza vaccine adjuvanted  0.5 mL Intramuscular Tomorrow-1000  . insulin aspart  0-9 Units Subcutaneous TID WC  . isosorbide mononitrate  15 mg Oral Daily  . levothyroxine  112 mcg Oral QAC breakfast  . pantoprazole  40 mg Oral q morning - 10a  . senna-docusate  2 tablet Oral BID  . traMADol  50 mg Oral Q6H  . cyanocobalamin  1,000 mcg Oral Daily  . Warfarin - Pharmacist Dosing Inpatient   Does not apply q1800   Continuous Infusions: . diltiazem (CARDIZEM) infusion 5 mg/hr (11/29/18 0712)   PRN Meds: Muscle Rub   Vital Signs    Vitals:   11/28/18 2350 11/29/18 0011 11/29/18 0416 11/29/18 0751  BP: 121/76  123/78 130/77  Pulse: 80  73 73  Resp: 17  17 18   Temp: 98.2 F (36.8 C)  (!) 97.5 F (36.4 C) (!) 97.4 F (36.3 C)  TempSrc: Oral  Oral Oral  SpO2: 100%  98% 98%  Weight:  113.2 kg    Height:        Intake/Output Summary (Last 24 hours) at 11/29/2018 0845 Last data filed at 11/29/2018 0800 Gross per 24 hour  Intake 981.53 ml  Output 865 ml  Net 116.53 ml   Last 3 Weights 11/29/2018 11/28/2018 11/12/2018  Weight (lbs) 249 lb 8 oz 248 lb 6.4 oz 255 lb  Weight (kg) 113.172 kg 112.674 kg 115.667 kg      Telemetry    AFib - Personally Reviewed  ECG    AFib, controlled rate, lateral T wave inversion, QTC 492 ms - Personally Reviewed  Physical Exam  Obese GEN: No acute distress.   Neck: No JVD Cardiac: irregular, no murmurs, rubs, or gallops.  Respiratory: Clear to auscultation  bilaterally. GI: Soft, nontender, non-distended  MS: No edema; No deformity. Neuro:  Nonfocal  Psych: Normal affect   Labs    High Sensitivity Troponin:   Recent Labs  Lab 11/27/18 1527 11/27/18 1750  TROPONINIHS 47* 49*      Chemistry Recent Labs  Lab 11/27/18 1527 11/28/18 0002 11/29/18 0600  NA 138 140 137  K 4.2 4.1 3.6  CL 99 100 100  CO2 27 24 27   GLUCOSE 147* 107* 131*  BUN 19 18 18   CREATININE 1.38* 1.32* 1.27*  CALCIUM 9.3 9.0 8.7*  GFRNONAA 50* 52* 55*  GFRAA 58* >60 >60  ANIONGAP 12 16* 10     Hematology Recent Labs  Lab 11/27/18 1527 11/28/18 0002 11/29/18 0600  WBC 6.6 6.9 6.6  RBC 4.68 4.78 4.19*  HGB 13.1 13.3 11.5*  HCT 42.3 42.6 37.7*  MCV 90.4 89.1 90.0  MCH 28.0 27.8 27.4  MCHC 31.0 31.2 30.5  RDW 14.3 14.4 14.4  PLT 189 155 165    BNP Recent Labs  Lab 11/27/18 1750  BNP 452.2*     DDimer No results for input(s): DDIMER in the last 168 hours.   Radiology  Dg Chest 2 View  Result Date: 11/27/2018 CLINICAL DATA:  Shortness of breath. EXAM: CHEST - 2 VIEW COMPARISON:  02/17/2018 FINDINGS: Stable asymmetric elevation right hemidiaphragm. Right base chronic atelectasis/scarring is similar to prior. No pulmonary edema or focal airspace consolidation. Cardiopericardial silhouette is at upper limits of normal for size. The visualized bony structures of the thorax are intact. IMPRESSION: Stable.  No acute findings. Electronically Signed   By: Misty Stanley M.D.   On: 11/27/2018 16:10   Vas [place] Lower Extremity Venous (dvt) (only Mc & Wl 7a-7p)  Result Date: 11/28/2018  Lower Venous Study Indications: Swelling, SOB, and Patient is on Coumadin.  Risk Factors: Rapid atrial fibrillation. Comparison Study: No prior study on file Performing Technologist: Sharion Dove RVS  Examination Guidelines: A complete evaluation includes B-mode imaging, spectral Doppler, color Doppler, and power Doppler as needed of all accessible portions of each  vessel. Bilateral testing is considered an integral part of a complete examination. Limited examinations for reoccurring indications may be performed as noted.  +-----+---------------+---------+-----------+----------+-------------------+ RIGHTCompressibilityPhasicitySpontaneityPropertiesThrombus Aging      +-----+---------------+---------+-----------+----------+-------------------+ CFV  Full                                         pulsatile waveforms +-----+---------------+---------+-----------+----------+-------------------+   +---------+---------------+---------+-----------+----------+-------------------+ LEFT     CompressibilityPhasicitySpontaneityPropertiesThrombus Aging      +---------+---------------+---------+-----------+----------+-------------------+ CFV      Full                                         pulsatile waveforms +---------+---------------+---------+-----------+----------+-------------------+ SFJ      Full                                                             +---------+---------------+---------+-----------+----------+-------------------+ FV Prox  Full                                                             +---------+---------------+---------+-----------+----------+-------------------+ FV Mid   Full                                                             +---------+---------------+---------+-----------+----------+-------------------+ FV DistalFull                                                             +---------+---------------+---------+-----------+----------+-------------------+ PFV      Full                                                             +---------+---------------+---------+-----------+----------+-------------------+  POP      Full                                         pulsatile waveforms +---------+---------------+---------+-----------+----------+-------------------+ PTV      Full                                                              +---------+---------------+---------+-----------+----------+-------------------+ PERO     Full                                                             +---------+---------------+---------+-----------+----------+-------------------+     Summary: Right: No evidence of common femoral vein obstruction. Left: There is no evidence of deep vein thrombosis in the lower extremity.  *See table(s) above for measurements and observations. Electronically signed by Servando Snare MD on 11/28/2018 at 97:37:04 AM.    Final     Cardiac Studies   CATH 02/19/2018 1. Left dominant circulation 2. Severe 3 vessel occlusive CAD.    - 95% ostial LAD    - 100% first diagonal    - 100% ostial LCx. This is new since 2017 - was 95% prior    - 100% nondominant RCA 3. Patent LIMA to the LAD. There is chronic severe disease in the apical LAD 4. Patent SVG to the first diagonal and SVG to the first OM as a Y graft. 5. Chronic occlusion of the SVG to terminal LCx 6. Normal LVEDP  Plan: continue medical management. Will resume IV heparin until coumadin is again therapeutic given history of CVA.   ECHO 02/18/2018  - Left ventricle: The cavity size was normal. There was mild focal   basal hypertrophy of the septum. Systolic function was normal.   The estimated ejection fraction was in the range of 55% to 60%.   Wall motion was normal; there were no regional wall motion   abnormalities. Doppler parameters are consistent with abnormal   left ventricular relaxation (grade 1 diastolic dysfunction).   Doppler parameters are consistent with elevated ventricular   end-diastolic filling pressure. - Mitral valve: Calcified annulus. Mildly thickened leaflets .   There was mild regurgitation. - Left atrium: The atrium was mildly dilated. - Right ventricle: The cavity size was mildly dilated. Wall   thickness was normal. Systolic function was mildly reduced. -  Right atrium: The atrium was normal in size. - Tricuspid valve: There was mild regurgitation. - Pulmonary arteries: Systolic pressure was within the normal   range. - Inferior vena cava: The vessel was normal in size. - Pericardium, extracardiac: There was no pericardial effusion.    Patient Profile     73 y.o. male with CAD s/p CABG, paroxysmal atrial fibrillation, chronic warfarin Rx, L hemiparesis following stroke, chronic diastolic HF, OSA, DM, HLP admitted with symptomatic persistent AFib w RVR and failed DCCV in ED.  Assessment & Plan    1. AFib: good rate control. Not a lot of good options for return and maintenance of SR due to  underlying CAD/CHF and prolonged QT. Switch to long acting diltiazem for rate control. 2. CHF: appears compensated, euvolemic. Preserved EF. 3. CAD: denies angina. Known occlusion of 3/4 grafts. 4. OSA/obesity: recommend 100% CPAP compliance.  For questions or updates, please contact [place] Please consult [email or web address] for contact info under        Signed, Sanda Klein, MD  11/29/2018, 8:45 AM

## 2018-11-30 ENCOUNTER — Telehealth: Payer: Self-pay | Admitting: *Deleted

## 2018-11-30 ENCOUNTER — Telehealth: Payer: Self-pay | Admitting: Family

## 2018-11-30 ENCOUNTER — Other Ambulatory Visit: Payer: Self-pay

## 2018-11-30 NOTE — Telephone Encounter (Signed)
Copied from [place] [id number]. Topic: General - Other >> Nov 30, 2018  1:48 PM Keene Breath wrote: Reason for CRM: Patient's wife  called to ask the nurse or doctor to call patient to explain some of his medication.  CB# [phone]

## 2018-11-30 NOTE — Telephone Encounter (Signed)
Transition Care Management Follow-up Telephone Call   Date discharged? 11/29/18   How have you been since you were released from the hospital? "Not my best"   Do you understand why you were in the hospital? "I have several questions that I will discuss w/ Melissa tomorrow at my appointment."   Do you understand the discharge instructions? Reports he will bring instructions w/ him to appointment. Does not wish to discuss over phone.    Where were you discharged to?Home w/ wife   Items Reviewed:  Medications reviewed: States he will discuss at PCP appt tomorrow.   Allergies reviewed: yes  Dietary changes reviewed: yes  Referrals reviewed: yes   Functional Questionnaire:   Activities of Daily Living (ADLs):   He states they are independent in the following: ambulation, bathing and hygiene, feeding, continence, grooming, toileting, dressing and uses walker States they require assistance with the following: na   Any transportation issues/concerns?: no   Any patient concerns? no   Confirmed importance and date/time of follow-up visits scheduled yes  Provider Appointment booked with PCP 12/01/18 @1140   Confirmed with patient if condition begins to worsen call PCP or go to the ER.  Patient was given the office number and encouraged to call back with question or concerns.  : yes

## 2018-12-01 ENCOUNTER — Telehealth: Payer: Self-pay | Admitting: Family

## 2018-12-01 ENCOUNTER — Ambulatory Visit (INDEPENDENT_AMBULATORY_CARE_PROVIDER_SITE_OTHER): Payer: Medicare Other | Admitting: Family

## 2018-12-01 ENCOUNTER — Encounter: Payer: Self-pay | Admitting: Family

## 2018-12-01 VITALS — BP 114/87 | HR 56 | Temp 97.1°F | Resp 16 | Wt 255.0 lb

## 2018-12-01 DIAGNOSIS — I4891 Unspecified atrial fibrillation: Secondary | ICD-10-CM | POA: Diagnosis not present

## 2018-12-01 DIAGNOSIS — I509 Heart failure, unspecified: Secondary | ICD-10-CM

## 2018-12-01 DIAGNOSIS — Z7901 Long term (current) use of anticoagulants: Secondary | ICD-10-CM

## 2018-12-01 LAB — BASIC METABOLIC PANEL
BUN: 14 mg/dL (ref 6–23)
CO2: 33 mEq/L — ABNORMAL HIGH (ref 19–32)
Calcium: 9.2 mg/dL (ref 8.4–10.5)
Chloride: 97 mEq/L (ref 96–112)
Creatinine, Ser: 0.99 mg/dL (ref 0.40–1.50)
GFR: 73.59 mL/min (ref >60.00)
Glucose, Bld: 110 mg/dL — ABNORMAL HIGH (ref 70–99)
Potassium: 4.3 mEq/L (ref 3.5–5.1)
Sodium: 138 mEq/L (ref 135–145)

## 2018-12-01 LAB — PROTIME-INR
INR: 2.8 ratio — ABNORMAL HIGH (ref 0.8–1.0)
Prothrombin Time: 31.6 s — ABNORMAL HIGH (ref 9.6–13.1)

## 2018-12-01 LAB — CBC WITH DIFFERENTIAL/PLATELET
Basophils Absolute: 0.1 10*3/uL (ref 0.0–0.1)
Basophils Relative: 0.8 % (ref 0.0–3.0)
Eosinophils Absolute: 0.1 10*3/uL (ref 0.0–0.7)
Eosinophils Relative: 1.1 % (ref 0.0–5.0)
HCT: 38.1 % — ABNORMAL LOW (ref 39.0–52.0)
Hemoglobin: 12.1 g/dL — ABNORMAL LOW (ref 13.0–17.0)
Lymphocytes Relative: 14.4 % (ref 12.0–46.0)
Lymphs Abs: 1 10*3/uL (ref 0.7–4.0)
MCHC: 31.8 g/dL (ref 30.0–36.0)
MCV: 87.1 fl (ref 78.0–100.0)
Monocytes Absolute: 0.7 10*3/uL (ref 0.1–1.0)
Monocytes Relative: 10.7 % (ref 3.0–12.0)
Neutro Abs: 4.9 10*3/uL (ref 1.4–7.7)
Neutrophils Relative %: 73 % (ref 43.0–77.0)
Platelets: 171 10*3/uL (ref 150.0–400.0)
RBC: 4.38 Mil/uL (ref 4.22–5.81)
RDW: 15.2 % (ref 11.5–15.5)
WBC: 6.7 10*3/uL (ref 4.0–10.5)

## 2018-12-01 LAB — POCT INR: INR: 2.7 (ref 2.0–3.0)

## 2018-12-01 NOTE — Progress Notes (Signed)
Subjective:    Patient ID: [name], male    DOB: August 21, 1945, 73 y.o.   MRN: [id number]  HPI  Patient is a 73 yr old male who presents today for hospital follow up. Discharge summary is reviewed.  He presented with SOB and found to be in AF with RVR. Cardioversion was attempted the ER and was unsuccessful.  He was ultimately placed on long acting diltiazem for rate control. He was maintained on warfarin for anticoagulation.  Wt Readings from Last 3 Encounters:  12/01/18 255 lb (115.7 kg)  11/29/18 249 lb 8 oz (113.2 kg)  11/12/18 255 lb (115.7 kg)   He reports that he is has some shortness of breath, worse with walking.   The patient is also requesting a prescription for a new compression stocking for his left lower extremity.  He states that the current stocking is too tight. Review of Systems    see HPI  Past Medical History:  Diagnosis Date  . Acute encephalopathy 12/12/2015  . Acute respiratory failure with hypoxia and hypercapnia ([place]) 12/12/2015  . Allergic rhinitis 02/13/2010   Qualifier: Diagnosis of  By: Wynona Luna   . Angina at rest [facility]) 10/22/2015  . Ataxia 08/10/2013  . B12 deficiency 08/11/2013  . BACK PAIN, LUMBAR 01/29/2009   Qualifier: Diagnosis of  By: Wynona Luna   . BENIGN POSITIONAL VERTIGO 11/23/2009   Qualifier: Diagnosis of  By: Wynona Luna   . Chronic diastolic heart failure ([place]) 04/24/2015  . Chronic pain    left sided-[place]  . Coronary 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 ([place]) 11/04/2015   s/p CABG 2004; CFX DES 2011  . Essential hypertension 12/12/2006   Qualifier: Diagnosis of  By: [name] RMA, Lucy    . GERD 12/12/2006   Qualifier: Diagnosis of  By: [name] RMA, Lucy    . GOUT 12/12/2006   Qualifier: Diagnosis of  By: Reatha Armour, Lucy    . Hyperlipidemia LDL goal <70 12/12/2006   Qualifier: Diagnosis of  By: [name] RMA, Lucy     . Hypogonadism male 04/10/2011  . Hypothyroidism, acquired 02/15/2013  .  Insomnia 05/21/2013  . Long term current use of anticoagulant therapy 06/24/2011  . Obstructive sleep apnea-Failed CPAP 06/17/2007   Failed CPAP Using O 2 for sleep   . Paroxysmal atrial fibrillation ([place]) 12/06/2016  . Stroke (cerebrum) ([place]) 01/13/2017   also 1996; resultant hemiplegia of dominant side  . Thoracic aorta atherosclerosis ([place]) 04/27/2017  . Tubular adenoma of colon 05/2010  . Type 2 diabetes mellitus without complication, without long-term current use of insulin ([place]) 10/05/2007   Qualifier: Diagnosis of  By: Wynona Luna   . Vasovagal syncope      Social History   Socioeconomic History  . Marital status: Married    Spouse name: [name]  . Number of children: Not on file  . Years of education: 27  . Highest education level: Not on file  Occupational History  . 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 Financial resource strain: Not on file  . Food insecurity    Worry: Not on file    Inability: Not on file  . Transportation needs    Medical: Not on file    Non-medical: Not on file  Tobacco Use  . Smoking status: Never Smoker  . Smokeless tobacco: Never Used  Substance and Sexual  Activity  . Alcohol use: No    Alcohol/week: 0.0 standard drinks    Comment: 1 beer a month  . Drug use: No  . Sexual activity: Not Currently  Lifestyle  . Physical activity    Days per week: Not on file    Minutes per session: Not on file  . Stress: Not on file  Relationships  . Social Herbalist on phone: Not on file    Gets together: Not on file    Attends religious service: Not on file    Active member of club or organization: Not on file    Attends meetings of clubs or organizations: Not on file    Relationship status: Not on file  . Intimate partner violence    Fear of current or ex partner: Not on file    Emotionally abused: Not on file    Physically abused: Not on file    Forced sexual activity: Not on file   Other Topics Concern  . Not on file  Social History Narrative   Pt lives with wife. Does have stairs, but patient doesn't use them. Pt has completed technical school    Past Surgical History:  Procedure Laterality Date  . CARDIAC CATHETERIZATION N/A 10/25/2015   Procedure: Left Heart Cath and Cors/Grafts Angiography;  Surgeon: Troy Sine, MD;  Location: [place] [facility];  Service: Cardiovascular;  Laterality: N/A;  . CARDIOVERSION  06/20/2011   Procedure: CARDIOVERSION;  Surgeon: Larey Dresser, MD;  Location: [facility] ENDOSCOPY;  Service: Cardiovascular;  Laterality: N/A;  . CARDIOVERSION N/A 04/26/2015   Procedure: CARDIOVERSION;  Surgeon: Dorothy Spark, MD;  Location: [facility] ENDOSCOPY;  Service: Cardiovascular;  Laterality: N/A;  . CARDIOVERSION N/A 05/10/2015   Procedure: CARDIOVERSION;  Surgeon: Larey Dresser, MD;  Location: [place];  Service: Cardiovascular;  Laterality: N/A;  . CATARACT EXTRACTION  05/2010   left eye  . CATARACT EXTRACTION  04/2010   right eye  . CORONARY ARTERY BYPASS GRAFT     stent  . ESOPHAGOGASTRODUODENOSCOPY  03-25-2005  . LEFT HEART CATH AND CORS/GRAFTS ANGIOGRAPHY N/A 02/19/2018   Procedure: LEFT HEART CATH AND CORS/GRAFTS ANGIOGRAPHY;  Surgeon: Martinique, Peter M, MD;  Location: [place] [facility] LAB;  Service: Cardiovascular;  Laterality: N/A;  . NASAL SEPTUM SURGERY    . PERCUTANEOUS PLACEMENT INTRAVASCULAR STENT CERVICAL CAROTID ARTERY     03-2009; using a drug-eluting platform of the circumflex cornoray artery with a 3.0 x 18 [employer] Promus drug-eluting platform post dilated to 3.75 with a noncompliant balloon.  . TEE WITHOUT CARDIOVERSION  06/20/2011   Procedure: TRANSESOPHAGEAL ECHOCARDIOGRAM (TEE);  Surgeon: Larey Dresser, MD;  Location: [facility] ENDOSCOPY;  Service: Cardiovascular;  Laterality: N/A;    Family History  Problem Relation Age of Onset  . Lung cancer Father        deceased  . Stroke Mother        deceased-MINISTROKES    No Known  Allergies  Current Outpatient Medications on File Prior to Visit  Medication Sig Dispense Refill  . ACCU-CHEK GUIDE test strip USE UP TO FOUR TIMES DAILY AS DIRECTED 100 strip 0  . allopurinol (ZYLOPRIM) 100 MG tablet TAKE 2 TABLETS BY MOUTH EVERY DAY (Patient taking differently: Take 200 mg by mouth daily. ) 180 tablet 1  . atorvastatin (LIPITOR) 40 MG tablet Take 1 tablet (40 mg total) by mouth daily. 90 tablet 3  . benazepril (LOTENSIN) 5 MG tablet Take 1 tablet (5  mg total) by mouth daily.    . blood glucose meter kit and supplies KIT Dispense based on patient and insurance preference. Use up to four times daily as directed. (FOR ICD-9 250.00, 250.01). 1 each 0  . diltiazem (CARDIZEM CD) 180 MG 24 hr capsule Take 1 capsule (180 mg total) by mouth daily. 30 capsule 1  . fluocinonide gel (LIDEX) 0.05 % Apply to scalp once daily as needed (Patient taking differently: Apply 1 application topically daily as needed (itching). ) 60 g 0  . fluticasone (FLONASE) 50 MCG/ACT nasal spray Place 2 sprays into both nostrils daily. 9.9 g 1  . furosemide (LASIX) 40 MG tablet TAKE 1 TABLET BY MOUTH EVERY OTHER DAY (Patient taking differently: Take 40 mg by mouth every other day. ) 45 tablet 1  . isosorbide mononitrate (IMDUR) 30 MG 24 hr tablet TAKE 1/2 TABLET BY MOUTH EVERY DAY (Patient taking differently: Take 15 mg by mouth daily. ) 45 tablet 3  . levothyroxine (SYNTHROID, LEVOTHROID) 112 MCG tablet TAKE 1 TABLET (112 MCG TOTAL) BY MOUTH DAILY BEFORE BREAKFAST. 90 tablet 1  . metFORMIN (GLUCOPHAGE) 500 MG tablet TAKE 1 TABLET (500 MG TOTAL) DAILY WITH BREAKFAST BY MOUTH. 90 tablet 1  . Multiple Vitamins-Minerals (MULTIVITAMIN GUMMIES MENS PO) Take 1 tablet by mouth daily with breakfast.     . nitroGLYCERIN (NITROSTAT) 0.4 MG SL tablet Place 1 tablet (0.4 mg total) under the tongue every 5 (five) minutes as needed for chest pain. Up to 3 doses 10 tablet 2  . OXYGEN Inhale 2 L into the lungs at bedtime.    .  pantoprazole (PROTONIX) 40 MG tablet TAKE 1 TABLET (40 MG TOTAL) EVERY MORNING BY MOUTH. 90 tablet 1  . senna-docusate (SENOKOT-S) 8.6-50 MG tablet Take 2 tablets by mouth 2 (two) times daily.    . traMADol (ULTRAM) 50 MG tablet TAKE 1 TABLET BY MOUTH TWICE A DAY 180 tablet 1  . traMADol (ULTRAM-ER) 200 MG 24 hr tablet Take 1 tablet (200 mg total) by mouth daily. 90 tablet 1  . vitamin B-12 1000 MCG tablet Take 1 tablet (1,000 mcg total) by mouth daily. 30 tablet 0  . warfarin (COUMADIN) 2.5 MG tablet Take 0.5-1 tablets (1.25-2.5 mg total) by mouth See admin instructions. Take 2.42m by mouth on MON WED FRI SAT, then take 1.231mon [place] and resume 9/16.     No current facility-administered medications on file prior to visit.     BP 114/87 (BP Location: Right Arm, Patient Position: Sitting, Cuff Size: Large)   Pulse (!) 56   Temp (!) 97.1 F (36.2 C) (Temporal)   Resp 16   Wt 255 lb (115.7 kg)   SpO2 98%   BMI 31.87 kg/m    Objective:   Physical Exam Constitutional:      General: He is not in acute distress.    Appearance: He is well-developed.  HENT:     Head: Normocephalic and atraumatic.  Cardiovascular:     Rate and Rhythm: Normal rate and regular rhythm.     Heart sounds: No murmur.  Pulmonary:     Effort: Pulmonary effort is normal. No respiratory distress.     Breath sounds: Normal breath sounds. No wheezing or rales.  Musculoskeletal:     Right lower leg: 1+ Edema present.     Left lower leg: 2+ Edema present.  Skin:    General: Skin is warm and dry.  Neurological:  Mental Status: He is alert and oriented to person, place, and time.  Psychiatric:        Behavior: Behavior normal.        Thought Content: Thought content normal.           Assessment & Plan:  Atrial Fibrillation- Rate stable, INR stable continue current dose of coumadin.  Appears clinically euvolemic.  Continue current meds/doses.  Pt advised to keep upcoming appointment  with cardiology.  LLE Edema-  Pt was given rx for knee high compression hose, 20-44m Hg, and advised him to go to [facility] to get fitted.

## 2018-12-01 NOTE — Telephone Encounter (Signed)
Spoke with patient about scheduling "Return for 2 weeks for INR recheck, 3 months for office visit." Patient advised me that he would call back to schedule his appointment.

## 2018-12-01 NOTE — Patient Instructions (Signed)
You can try going to [facility] on [address] to be measured for your compression stockings.  Complete lab work prior to leaving.

## 2018-12-02 ENCOUNTER — Other Ambulatory Visit: Payer: Self-pay | Admitting: Physical Medicine & Rehabilitation

## 2018-12-02 NOTE — Telephone Encounter (Signed)
Patient was seen 12-01-2018

## 2018-12-06 ENCOUNTER — Encounter: Payer: Medicare Other | Admitting: Physical Medicine & Rehabilitation

## 2018-12-07 ENCOUNTER — Ambulatory Visit: Payer: Self-pay | Admitting: Family

## 2018-12-07 ENCOUNTER — Other Ambulatory Visit: Payer: Self-pay

## 2018-12-07 NOTE — Telephone Encounter (Signed)
Pt evasive historian. Is difficult to follow conversation, pt very HOH. States initially BP fluctuating "All over the place." Further assessment indicated pt was referring to HR, which he was checking with Pulse Ox. States going from "68 to 71 to 84 then 96." Denies any palpitations, no CP or tightness. Pt also reports SOB "At night with O2 on."  States is at 2.5 Ls. Questioning if he should go to 3 Ls. States SOB is mostly at HS with O2 on, some during day on RA. Indicates he wears O2 PRN. Pt sounds very anxious. Call transferred to practice, [name], for consideration fo appt. Pt requesting appt in AM. Care advise given; advised to go to ED if SOB worsens, CP, palpitations, dizziness occur.  Reason for Disposition . [1] Palpitations AND [2] no improvement after using CARE ADVICE  Answer Assessment - Initial Assessment Questions 1. DESCRIPTION: "Please describe your heart rate or heart beat that you are having" (e.g., fast/slow, regular/irregular, skipped or extra beats, "palpitations")    Pt checking on pulse OX. Says fluctuating  2. ONSET: "When did it start?" (Minutes, hours or days)      2 days ago 3. DURATION: "How long does it last" (e.g., seconds, minutes, hours)      4. PATTERN "Does it come and go, or has it been constant since it started?"  "Does it get worse with exertion?"   "Are you feeling it now?"      5. TAP: "Using your hand, can you tap out what you are feeling on a chair or table in front of you, so that I can hear?" (Note: not all patients can do this)       Cannot do 6. HEART RATE: "Can you tell me your heart rate?" "How many beats in 15 seconds?"  (Note: not all patients can do this)      71 7. RECURRENT SYMPTOM: "Have you ever had this before?" If so, ask: "When was the last time?" and "What happened that time?"      8. CAUSE: "What do you think is causing the palpitations?"     9. CARDIAC HISTORY: "Do you have any history of heart disease?" (e.g., heart attack, angina,  bypass surgery, angioplasty, arrhythmia)     yes 10. OTHER SYMPTOMS: "Do you have any other symptoms?" (e.g., dizziness, chest pain, sweating, difficulty breathing)       SOB at night  Protocols used: [place]

## 2018-12-08 ENCOUNTER — Ambulatory Visit (INDEPENDENT_AMBULATORY_CARE_PROVIDER_SITE_OTHER): Payer: Medicare Other | Admitting: Family

## 2018-12-08 ENCOUNTER — Encounter (HOSPITAL_COMMUNITY): Payer: Self-pay

## 2018-12-08 ENCOUNTER — Other Ambulatory Visit: Payer: Self-pay

## 2018-12-08 ENCOUNTER — Emergency Department (HOSPITAL_COMMUNITY): Payer: Medicare Other

## 2018-12-08 ENCOUNTER — Inpatient Hospital Stay (HOSPITAL_COMMUNITY)
Admission: EM | Admit: 2018-12-08 | Discharge: 2018-12-16 | DRG: 308 | Disposition: A | Discharge status: Rehabilitation Facility | Payer: Medicare Other | Attending: Cardiovascular Disease | Admitting: Cardiovascular Disease

## 2018-12-08 VITALS — BP 134/94 | HR 50 | Temp 97.4°F | Resp 18

## 2018-12-08 DIAGNOSIS — D649 Anemia, unspecified: Secondary | ICD-10-CM

## 2018-12-08 DIAGNOSIS — T148XXA Other injury of unspecified body region, initial encounter: Secondary | ICD-10-CM

## 2018-12-08 DIAGNOSIS — I5032 Chronic diastolic (congestive) heart failure: Secondary | ICD-10-CM

## 2018-12-08 DIAGNOSIS — E039 Hypothyroidism, unspecified: Secondary | ICD-10-CM | POA: Diagnosis present

## 2018-12-08 DIAGNOSIS — M109 Gout, unspecified: Secondary | ICD-10-CM | POA: Diagnosis present

## 2018-12-08 DIAGNOSIS — D62 Acute posthemorrhagic anemia: Secondary | ICD-10-CM | POA: Diagnosis not present

## 2018-12-08 DIAGNOSIS — J309 Allergic rhinitis, unspecified: Secondary | ICD-10-CM | POA: Diagnosis present

## 2018-12-08 DIAGNOSIS — D500 Iron deficiency anemia secondary to blood loss (chronic): Secondary | ICD-10-CM

## 2018-12-08 DIAGNOSIS — Z20828 Contact with and (suspected) exposure to other viral communicable diseases: Secondary | ICD-10-CM | POA: Diagnosis not present

## 2018-12-08 DIAGNOSIS — Z79890 Hormone replacement therapy (postmenopausal): Secondary | ICD-10-CM

## 2018-12-08 DIAGNOSIS — I5033 Acute on chronic diastolic (congestive) heart failure: Secondary | ICD-10-CM | POA: Diagnosis present

## 2018-12-08 DIAGNOSIS — R0602 Shortness of breath: Secondary | ICD-10-CM

## 2018-12-08 DIAGNOSIS — R079 Chest pain, unspecified: Secondary | ICD-10-CM | POA: Diagnosis not present

## 2018-12-08 DIAGNOSIS — I69354 Hemiplegia and hemiparesis following cerebral infarction affecting left non-dominant side: Secondary | ICD-10-CM

## 2018-12-08 DIAGNOSIS — I4819 Other persistent atrial fibrillation: Secondary | ICD-10-CM | POA: Diagnosis not present

## 2018-12-08 DIAGNOSIS — R7989 Other specified abnormal findings of blood chemistry: Secondary | ICD-10-CM

## 2018-12-08 DIAGNOSIS — G4733 Obstructive sleep apnea (adult) (pediatric): Secondary | ICD-10-CM | POA: Diagnosis present

## 2018-12-08 DIAGNOSIS — S20213A Contusion of bilateral front wall of thorax, initial encounter: Secondary | ICD-10-CM | POA: Diagnosis not present

## 2018-12-08 DIAGNOSIS — R471 Dysarthria and anarthria: Secondary | ICD-10-CM | POA: Diagnosis not present

## 2018-12-08 DIAGNOSIS — I4891 Unspecified atrial fibrillation: Secondary | ICD-10-CM | POA: Diagnosis not present

## 2018-12-08 DIAGNOSIS — R778 Other specified abnormalities of plasma proteins: Secondary | ICD-10-CM

## 2018-12-08 DIAGNOSIS — J9811 Atelectasis: Secondary | ICD-10-CM | POA: Diagnosis present

## 2018-12-08 DIAGNOSIS — I25810 Atherosclerosis of coronary artery bypass graft(s) without angina pectoris: Secondary | ICD-10-CM | POA: Diagnosis present

## 2018-12-08 DIAGNOSIS — E291 Testicular hypofunction: Secondary | ICD-10-CM | POA: Diagnosis present

## 2018-12-08 DIAGNOSIS — I110 Hypertensive heart disease with heart failure: Secondary | ICD-10-CM | POA: Diagnosis present

## 2018-12-08 DIAGNOSIS — Z9981 Dependence on supplemental oxygen: Secondary | ICD-10-CM

## 2018-12-08 DIAGNOSIS — Z7984 Long term (current) use of oral hypoglycemic drugs: Secondary | ICD-10-CM

## 2018-12-08 DIAGNOSIS — Z7901 Long term (current) use of anticoagulants: Secondary | ICD-10-CM

## 2018-12-08 DIAGNOSIS — X58XXXA Exposure to other specified factors, initial encounter: Secondary | ICD-10-CM | POA: Diagnosis not present

## 2018-12-08 DIAGNOSIS — S20219A Contusion of unspecified front wall of thorax, initial encounter: Secondary | ICD-10-CM

## 2018-12-08 DIAGNOSIS — R300 Dysuria: Secondary | ICD-10-CM

## 2018-12-08 DIAGNOSIS — I480 Paroxysmal atrial fibrillation: Secondary | ICD-10-CM | POA: Diagnosis present

## 2018-12-08 DIAGNOSIS — E538 Deficiency of other specified B group vitamins: Secondary | ICD-10-CM | POA: Diagnosis present

## 2018-12-08 DIAGNOSIS — G8929 Other chronic pain: Secondary | ICD-10-CM | POA: Diagnosis present

## 2018-12-08 DIAGNOSIS — E785 Hyperlipidemia, unspecified: Secondary | ICD-10-CM | POA: Diagnosis present

## 2018-12-08 DIAGNOSIS — I2510 Atherosclerotic heart disease of native coronary artery without angina pectoris: Secondary | ICD-10-CM | POA: Diagnosis present

## 2018-12-08 DIAGNOSIS — E119 Type 2 diabetes mellitus without complications: Secondary | ICD-10-CM

## 2018-12-08 DIAGNOSIS — R410 Disorientation, unspecified: Secondary | ICD-10-CM

## 2018-12-08 DIAGNOSIS — K219 Gastro-esophageal reflux disease without esophagitis: Secondary | ICD-10-CM | POA: Diagnosis present

## 2018-12-08 DIAGNOSIS — E669 Obesity, unspecified: Secondary | ICD-10-CM | POA: Diagnosis present

## 2018-12-08 DIAGNOSIS — Z6831 Body mass index (BMI) 31.0-31.9, adult: Secondary | ICD-10-CM

## 2018-12-08 DIAGNOSIS — Z79899 Other long term (current) drug therapy: Secondary | ICD-10-CM

## 2018-12-08 LAB — CBC
HCT: 41.3 % (ref 39.0–52.0)
Hemoglobin: 12.7 g/dL — ABNORMAL LOW (ref 13.0–17.0)
MCH: 27.9 pg (ref 26.0–34.0)
MCHC: 30.8 g/dL (ref 30.0–36.0)
MCV: 90.8 fL (ref 80.0–100.0)
Platelets: 159 10*3/uL (ref 150–400)
RBC: 4.55 MIL/uL (ref 4.22–5.81)
RDW: 14.1 % (ref 11.5–15.5)
WBC: 5.7 10*3/uL (ref 4.0–10.5)
nRBC: 0 % (ref 0.0–0.2)

## 2018-12-08 LAB — PROTIME-INR
INR: 2 — ABNORMAL HIGH (ref 0.8–1.2)
Prothrombin Time: 22.7 seconds — ABNORMAL HIGH (ref 11.4–15.2)

## 2018-12-08 LAB — BASIC METABOLIC PANEL
Anion gap: 12 (ref 5–15)
BUN: 14 mg/dL (ref 8–23)
CO2: 28 mmol/L (ref 22–32)
Calcium: 9.2 mg/dL (ref 8.9–10.3)
Chloride: 99 mmol/L (ref 98–111)
Creatinine, Ser: 1.21 mg/dL (ref 0.61–1.24)
GFR calc Af Amer: >60 mL/min (ref >60)
GFR calc non Af Amer: 58 mL/min — ABNORMAL LOW (ref >60)
Glucose, Bld: 143 mg/dL — ABNORMAL HIGH (ref 70–99)
Potassium: 3.5 mmol/L (ref 3.5–5.1)
Sodium: 139 mmol/L (ref 135–145)

## 2018-12-08 LAB — TROPONIN I (HIGH SENSITIVITY)
Troponin I (High Sensitivity): 24 ng/L — ABNORMAL HIGH (ref <18)
Troponin I (High Sensitivity): 30 ng/L — ABNORMAL HIGH (ref <18)

## 2018-12-08 MED ORDER — SODIUM CHLORIDE 0.9% FLUSH
3.0000 mL | Freq: Once | INTRAVENOUS | Status: AC
  Administered 2018-12-09: 3 mL via INTRAVENOUS

## 2018-12-08 NOTE — Progress Notes (Signed)
Subjective:    Patient ID: [name], male    DOB: Jul 21, 1945, 73 y.o.   MRN: [id number]  HPI  Patient is a 73 yr old male who presents today with chief complaint of shortness of breath. He was recently hospitalized for AF with RVR and discharged home on diltiazem cd 162m.    Wife reports that patient had some shortness of breath. He placed his prn oxygen on at home and this helped his symptoms. Reports that he had some mild SOB over the weekend as well.    Today he reports that his breathing is "terrible."  Worse with ambulation.   He denies wheezing.  Reports that he feels so SOB he is afraid he might pass out.   Notes that he got confused and was not taking his synthroid.   Wt Readings from Last 3 Encounters:  12/01/18 255 lb (115.7 kg)  11/29/18 249 lb 8 oz (113.2 kg)  11/12/18 255 lb (115.7 kg)     Review of Systems   See HPI  Past Medical History:  Diagnosis Date  . Acute encephalopathy 12/12/2015  . Acute respiratory failure with hypoxia and hypercapnia ([place] 12/12/2015  . Allergic rhinitis 02/13/2010   Qualifier: Diagnosis of  By: YWynona Luna  . Angina at rest ([facility] 10/22/2015  . Ataxia 08/10/2013  . B12 deficiency 08/11/2013  . BACK PAIN, LUMBAR 01/29/2009   Qualifier: Diagnosis of  By: YWynona Luna  . BENIGN POSITIONAL VERTIGO 11/23/2009   Qualifier: Diagnosis of  By: YWynona Luna  . Chronic diastolic heart failure ([facility] 04/24/2015  . Chronic pain    left sided-Kristeins  . Coronary 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 ([facility] 11/04/2015   s/p CABG 2004; CFX DES 2011  . Essential hypertension 12/12/2006   Qualifier: Diagnosis of  By: BMarca AnconaRMA, Lucy    . GERD 12/12/2006   Qualifier: Diagnosis of  By: BMarca AnconaRMA, Lucy    . GOUT 12/12/2006   Qualifier: Diagnosis of  By: BReatha Armour Lucy    . Hyperlipidemia LDL goal <70 12/12/2006   Qualifier: Diagnosis of  By: BMarca AnconaRMA, Lucy     . Hypogonadism male 04/10/2011  .  Hypothyroidism, acquired 02/15/2013  . Insomnia 05/21/2013  . Long term current use of anticoagulant therapy 06/24/2011  . Obstructive sleep apnea-Failed CPAP 06/17/2007   Failed CPAP Using O 2 for sleep   . Paroxysmal atrial fibrillation ([place] 12/06/2016  . Stroke (cerebrum) ([facility] 01/13/2017   also 1996; resultant hemiplegia of dominant side  . Thoracic aorta atherosclerosis ([place] 04/27/2017  . Tubular adenoma of colon 05/2010  . Type 2 diabetes mellitus without complication, without long-term current use of insulin ([place] 10/05/2007   Qualifier: Diagnosis of  By: YWynona Luna  . Vasovagal syncope      Social History   Socioeconomic History  . Marital status: Married    Spouse name: GPeter Congo . Number of children: Not on file  . Years of education: 174 . Highest education level: Not on file  Occupational History  . 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 Financial resource strain: Not on file  . Food insecurity    Worry: Not on file    Inability: Not on file  . Transportation needs    Medical: Not on file    Non-medical: Not on file  Tobacco Use  . Smoking status: Never Smoker  . Smokeless tobacco: Never Used  Substance and Sexual Activity  . Alcohol use: No    Alcohol/week: 0.0 standard drinks    Comment: 1 beer a month  . Drug use: No  . Sexual activity: Not Currently  Lifestyle  . Physical activity    Days per week: Not on file    Minutes per session: Not on file  . Stress: Not on file  Relationships  . Social Herbalist on phone: Not on file    Gets together: Not on file    Attends religious service: Not on file    Active member of club or organization: Not on file    Attends meetings of clubs or organizations: Not on file    Relationship status: Not on file  . Intimate partner violence    Fear of current or ex partner: Not on file    Emotionally abused: Not on file    Physically abused: Not on file     Forced sexual activity: Not on file  Other Topics Concern  . Not on file  Social History Narrative   Pt lives with wife. Does have stairs, but patient doesn't use them. Pt has completed technical school    Past Surgical History:  Procedure Laterality Date  . CARDIAC CATHETERIZATION N/A 10/25/2015   Procedure: Left Heart Cath and Cors/Grafts Angiography;  Surgeon: Troy Sine, MD;  Location: [place] CV LAB;  Service: Cardiovascular;  Laterality: N/A;  . CARDIOVERSION  06/20/2011   Procedure: CARDIOVERSION;  Surgeon: Larey Dresser, MD;  Location: [facility] ENDOSCOPY;  Service: Cardiovascular;  Laterality: N/A;  . CARDIOVERSION N/A 04/26/2015   Procedure: CARDIOVERSION;  Surgeon: Dorothy Spark, MD;  Location: [facility] ENDOSCOPY;  Service: Cardiovascular;  Laterality: N/A;  . CARDIOVERSION N/A 05/10/2015   Procedure: CARDIOVERSION;  Surgeon: Larey Dresser, MD;  Location: [place];  Service: Cardiovascular;  Laterality: N/A;  . CATARACT EXTRACTION  05/2010   left eye  . CATARACT EXTRACTION  04/2010   right eye  . CORONARY ARTERY BYPASS GRAFT     stent  . ESOPHAGOGASTRODUODENOSCOPY  03-25-2005  . LEFT HEART CATH AND CORS/GRAFTS ANGIOGRAPHY N/A 02/19/2018   Procedure: LEFT HEART CATH AND CORS/GRAFTS ANGIOGRAPHY;  Surgeon: Martinique, Peter M, MD;  Location: [place] CV LAB;  Service: Cardiovascular;  Laterality: N/A;  . NASAL SEPTUM SURGERY    . PERCUTANEOUS PLACEMENT INTRAVASCULAR STENT CERVICAL CAROTID ARTERY     03-2009; using a drug-eluting platform of the circumflex cornoray artery with a 3.0 x 18 [employer] Promus drug-eluting platform post dilated to 3.75 with a noncompliant balloon.  . TEE WITHOUT CARDIOVERSION  06/20/2011   Procedure: TRANSESOPHAGEAL ECHOCARDIOGRAM (TEE);  Surgeon: Larey Dresser, MD;  Location: [facility] ENDOSCOPY;  Service: Cardiovascular;  Laterality: N/A;    Family History  Problem Relation Age of Onset  . Lung cancer Father        deceased  . Stroke Mother         deceased-MINISTROKES    No Known Allergies  Current Outpatient Medications on File Prior to Visit  Medication Sig Dispense Refill  . ACCU-CHEK GUIDE test strip USE UP TO FOUR TIMES DAILY AS DIRECTED 100 strip 0  . allopurinol (ZYLOPRIM) 100 MG tablet TAKE 2 TABLETS BY MOUTH EVERY DAY (Patient taking differently: Take 200 mg by mouth daily. ) 180 tablet 1  . atorvastatin (LIPITOR) 40 MG tablet Take 1 tablet (40 mg  total) by mouth daily. 90 tablet 3  . benazepril (LOTENSIN) 5 MG tablet Take 1 tablet (5 mg total) by mouth daily.    . blood glucose meter kit and supplies KIT Dispense based on patient and insurance preference. Use up to four times daily as directed. (FOR ICD-9 250.00, 250.01). 1 each 0  . diltiazem (CARDIZEM CD) 180 MG 24 hr capsule Take 1 capsule (180 mg total) by mouth daily. 30 capsule 1  . fluocinonide gel (LIDEX) 0.05 % Apply to scalp once daily as needed (Patient taking differently: Apply 1 application topically daily as needed (itching). ) 60 g 0  . fluticasone (FLONASE) 50 MCG/ACT nasal spray Place 2 sprays into both nostrils daily. 9.9 g 1  . furosemide (LASIX) 40 MG tablet TAKE 1 TABLET BY MOUTH EVERY OTHER DAY (Patient taking differently: Take 40 mg by mouth every other day. ) 45 tablet 1  . isosorbide mononitrate (IMDUR) 30 MG 24 hr tablet TAKE 1/2 TABLET BY MOUTH EVERY DAY (Patient taking differently: Take 15 mg by mouth daily. ) 45 tablet 3  . levothyroxine (SYNTHROID, LEVOTHROID) 112 MCG tablet TAKE 1 TABLET (112 MCG TOTAL) BY MOUTH DAILY BEFORE BREAKFAST. 90 tablet 1  . metFORMIN (GLUCOPHAGE) 500 MG tablet TAKE 1 TABLET (500 MG TOTAL) DAILY WITH BREAKFAST BY MOUTH. 90 tablet 1  . Multiple Vitamins-Minerals (MULTIVITAMIN GUMMIES MENS PO) Take 1 tablet by mouth daily with breakfast.     . nitroGLYCERIN (NITROSTAT) 0.4 MG SL tablet Place 1 tablet (0.4 mg total) under the tongue every 5 (five) minutes as needed for chest pain. Up to 3 doses 10 tablet 2  . OXYGEN Inhale 2  L into the lungs at bedtime.    . pantoprazole (PROTONIX) 40 MG tablet TAKE 1 TABLET (40 MG TOTAL) EVERY MORNING BY MOUTH. 90 tablet 1  . senna-docusate (SENOKOT-S) 8.6-50 MG tablet Take 2 tablets by mouth 2 (two) times daily.    . traMADol (ULTRAM) 50 MG tablet TAKE 1 TABLET BY MOUTH TWICE A DAY 180 tablet 1  . traMADol (ULTRAM-ER) 200 MG 24 hr tablet TAKE 1 TABLET BY MOUTH EVERY DAY 90 tablet 1  . vitamin B-12 1000 MCG tablet Take 1 tablet (1,000 mcg total) by mouth daily. 30 tablet 0  . warfarin (COUMADIN) 2.5 MG tablet Take 0.5-1 tablets (1.25-2.5 mg total) by mouth See admin instructions. Take 2.46m by mouth on MON WED FRI SAT, then take 1.254mon TUElk [place] and resume 9/16.     No current facility-administered medications on file prior to visit.     BP (!) 134/94 (BP Location: Right Arm, Patient Position: Sitting, Cuff Size: Large)   Pulse (!) 50   Temp (!) 97.4 F (36.3 C) (Temporal)   Resp 18   SpO2 98%        Objective:   Physical Exam Constitutional:      General: He is not in acute distress.    Appearance: He is well-developed.  HENT:     Head: Normocephalic and atraumatic.  Cardiovascular:     Rate and Rhythm: Tachycardia present. Rhythm irregular.     Heart sounds: No murmur.  Pulmonary:     Effort: Pulmonary effort is normal. No respiratory distress.     Breath sounds: Normal breath sounds. No wheezing or rales.  Skin:    General: Skin is warm and dry.  Neurological:     Mental Status: He is alert and oriented to person, place, and time.  Psychiatric:  Behavior: Behavior normal.        Thought Content: Thought content normal.           Assessment & Plan:  Hypothyroid- pt is advised to restart synthroid.  Atrial fibrillation with RVR- continues coumadin and diltiazem. oxygen saturation looks good. Drops to 93% with ambulation. Per CMA HR went up to 150 with ambulation. EKG tracing is personally reviewed.  EKG notes AF with rate 115.  No acute changes.  Patient is up 6 pounds today.  Case was reviewed with Dr. Jolyn Nap EP and cardiology doc of the day. He recommends that we send the patient to the ED for admission. Cardiology plan would be to initiate amiodarone and re-attempt cardioversion early next week. Plan reviewed with pt and he is advised to head now to the [facility] ED. Wife will drive him. Report was given to Dr. Marye Round EDP at [facility].   40 minutes spent on today's visit. >50% of this time was spent counseling pt on AF and plan of care.

## 2018-12-08 NOTE — Telephone Encounter (Signed)
Appt scheduled

## 2018-12-08 NOTE — ED Triage Notes (Signed)
Patient states that he was sent by cardiologist for increased lower left leg swelling and discomfort. Patient also states that he is to be converted with medication change.

## 2018-12-08 NOTE — Patient Instructions (Signed)
Please go to the [facility] ER now for further evaluation of your heart.

## 2018-12-09 ENCOUNTER — Encounter (HOSPITAL_COMMUNITY): Payer: Self-pay | Admitting: General Practice

## 2018-12-09 DIAGNOSIS — Z951 Presence of aortocoronary bypass graft: Secondary | ICD-10-CM | POA: Diagnosis not present

## 2018-12-09 DIAGNOSIS — I25810 Atherosclerosis of coronary artery bypass graft(s) without angina pectoris: Secondary | ICD-10-CM | POA: Diagnosis present

## 2018-12-09 DIAGNOSIS — G8929 Other chronic pain: Secondary | ICD-10-CM | POA: Diagnosis present

## 2018-12-09 DIAGNOSIS — E1169 Type 2 diabetes mellitus with other specified complication: Secondary | ICD-10-CM | POA: Diagnosis not present

## 2018-12-09 DIAGNOSIS — Z7984 Long term (current) use of oral hypoglycemic drugs: Secondary | ICD-10-CM | POA: Diagnosis not present

## 2018-12-09 DIAGNOSIS — Z6831 Body mass index (BMI) 31.0-31.9, adult: Secondary | ICD-10-CM | POA: Diagnosis not present

## 2018-12-09 DIAGNOSIS — G4700 Insomnia, unspecified: Secondary | ICD-10-CM | POA: Diagnosis present

## 2018-12-09 DIAGNOSIS — R7989 Other specified abnormal findings of blood chemistry: Secondary | ICD-10-CM | POA: Diagnosis not present

## 2018-12-09 DIAGNOSIS — R4182 Altered mental status, unspecified: Secondary | ICD-10-CM | POA: Diagnosis not present

## 2018-12-09 DIAGNOSIS — I5032 Chronic diastolic (congestive) heart failure: Secondary | ICD-10-CM | POA: Diagnosis not present

## 2018-12-09 DIAGNOSIS — I4891 Unspecified atrial fibrillation: Secondary | ICD-10-CM | POA: Diagnosis not present

## 2018-12-09 DIAGNOSIS — Z9841 Cataract extraction status, right eye: Secondary | ICD-10-CM | POA: Diagnosis not present

## 2018-12-09 DIAGNOSIS — I2510 Atherosclerotic heart disease of native coronary artery without angina pectoris: Secondary | ICD-10-CM | POA: Diagnosis not present

## 2018-12-09 DIAGNOSIS — E119 Type 2 diabetes mellitus without complications: Secondary | ICD-10-CM | POA: Diagnosis present

## 2018-12-09 DIAGNOSIS — E039 Hypothyroidism, unspecified: Secondary | ICD-10-CM | POA: Diagnosis not present

## 2018-12-09 DIAGNOSIS — Z823 Family history of stroke: Secondary | ICD-10-CM | POA: Diagnosis not present

## 2018-12-09 DIAGNOSIS — X58XXXA Exposure to other specified factors, initial encounter: Secondary | ICD-10-CM | POA: Diagnosis not present

## 2018-12-09 DIAGNOSIS — Z20828 Contact with and (suspected) exposure to other viral communicable diseases: Secondary | ICD-10-CM | POA: Diagnosis present

## 2018-12-09 DIAGNOSIS — I639 Cerebral infarction, unspecified: Secondary | ICD-10-CM | POA: Diagnosis not present

## 2018-12-09 DIAGNOSIS — R471 Dysarthria and anarthria: Secondary | ICD-10-CM | POA: Diagnosis not present

## 2018-12-09 DIAGNOSIS — N6489 Other specified disorders of breast: Secondary | ICD-10-CM | POA: Diagnosis not present

## 2018-12-09 DIAGNOSIS — D62 Acute posthemorrhagic anemia: Secondary | ICD-10-CM | POA: Diagnosis not present

## 2018-12-09 DIAGNOSIS — Y92239 Unspecified place in hospital as the place of occurrence of the external cause: Secondary | ICD-10-CM | POA: Diagnosis present

## 2018-12-09 DIAGNOSIS — J9811 Atelectasis: Secondary | ICD-10-CM | POA: Diagnosis not present

## 2018-12-09 DIAGNOSIS — R0602 Shortness of breath: Secondary | ICD-10-CM | POA: Diagnosis not present

## 2018-12-09 DIAGNOSIS — E669 Obesity, unspecified: Secondary | ICD-10-CM | POA: Diagnosis not present

## 2018-12-09 DIAGNOSIS — J986 Disorders of diaphragm: Secondary | ICD-10-CM | POA: Diagnosis not present

## 2018-12-09 DIAGNOSIS — K219 Gastro-esophageal reflux disease without esophagitis: Secondary | ICD-10-CM | POA: Diagnosis present

## 2018-12-09 DIAGNOSIS — K5900 Constipation, unspecified: Secondary | ICD-10-CM | POA: Diagnosis present

## 2018-12-09 DIAGNOSIS — Z801 Family history of malignant neoplasm of trachea, bronchus and lung: Secondary | ICD-10-CM | POA: Diagnosis not present

## 2018-12-09 DIAGNOSIS — E291 Testicular hypofunction: Secondary | ICD-10-CM | POA: Diagnosis present

## 2018-12-09 DIAGNOSIS — E1151 Type 2 diabetes mellitus with diabetic peripheral angiopathy without gangrene: Secondary | ICD-10-CM | POA: Diagnosis present

## 2018-12-09 DIAGNOSIS — G4733 Obstructive sleep apnea (adult) (pediatric): Secondary | ICD-10-CM | POA: Diagnosis not present

## 2018-12-09 DIAGNOSIS — Z9981 Dependence on supplemental oxygen: Secondary | ICD-10-CM | POA: Diagnosis not present

## 2018-12-09 DIAGNOSIS — J984 Other disorders of lung: Secondary | ICD-10-CM | POA: Diagnosis not present

## 2018-12-09 DIAGNOSIS — I69354 Hemiplegia and hemiparesis following cerebral infarction affecting left non-dominant side: Secondary | ICD-10-CM | POA: Diagnosis not present

## 2018-12-09 DIAGNOSIS — E538 Deficiency of other specified B group vitamins: Secondary | ICD-10-CM | POA: Diagnosis present

## 2018-12-09 DIAGNOSIS — S20213A Contusion of bilateral front wall of thorax, initial encounter: Secondary | ICD-10-CM | POA: Diagnosis not present

## 2018-12-09 DIAGNOSIS — Z9842 Cataract extraction status, left eye: Secondary | ICD-10-CM | POA: Diagnosis not present

## 2018-12-09 DIAGNOSIS — I110 Hypertensive heart disease with heart failure: Secondary | ICD-10-CM | POA: Diagnosis not present

## 2018-12-09 DIAGNOSIS — E785 Hyperlipidemia, unspecified: Secondary | ICD-10-CM | POA: Diagnosis not present

## 2018-12-09 DIAGNOSIS — D500 Iron deficiency anemia secondary to blood loss (chronic): Secondary | ICD-10-CM | POA: Diagnosis not present

## 2018-12-09 DIAGNOSIS — G9340 Encephalopathy, unspecified: Secondary | ICD-10-CM | POA: Diagnosis not present

## 2018-12-09 DIAGNOSIS — M7981 Nontraumatic hematoma of soft tissue: Secondary | ICD-10-CM | POA: Diagnosis present

## 2018-12-09 DIAGNOSIS — M109 Gout, unspecified: Secondary | ICD-10-CM | POA: Diagnosis present

## 2018-12-09 DIAGNOSIS — Z955 Presence of coronary angioplasty implant and graft: Secondary | ICD-10-CM | POA: Diagnosis not present

## 2018-12-09 DIAGNOSIS — I5033 Acute on chronic diastolic (congestive) heart failure: Secondary | ICD-10-CM | POA: Diagnosis not present

## 2018-12-09 DIAGNOSIS — I4819 Other persistent atrial fibrillation: Secondary | ICD-10-CM | POA: Diagnosis not present

## 2018-12-09 DIAGNOSIS — I517 Cardiomegaly: Secondary | ICD-10-CM | POA: Diagnosis not present

## 2018-12-09 DIAGNOSIS — J309 Allergic rhinitis, unspecified: Secondary | ICD-10-CM | POA: Diagnosis present

## 2018-12-09 DIAGNOSIS — R0789 Other chest pain: Secondary | ICD-10-CM | POA: Diagnosis not present

## 2018-12-09 DIAGNOSIS — Z7901 Long term (current) use of anticoagulants: Secondary | ICD-10-CM | POA: Diagnosis not present

## 2018-12-09 LAB — CBC
HCT: 41.6 % (ref 39.0–52.0)
Hemoglobin: 12.6 g/dL — ABNORMAL LOW (ref 13.0–17.0)
MCH: 27.2 pg (ref 26.0–34.0)
MCHC: 30.3 g/dL (ref 30.0–36.0)
MCV: 89.7 fL (ref 80.0–100.0)
Platelets: 169 10*3/uL (ref 150–400)
RBC: 4.64 MIL/uL (ref 4.22–5.81)
RDW: 14.2 % (ref 11.5–15.5)
WBC: 6.7 10*3/uL (ref 4.0–10.5)
nRBC: 0 % (ref 0.0–0.2)

## 2018-12-09 LAB — BASIC METABOLIC PANEL
Anion gap: 12 (ref 5–15)
BUN: 13 mg/dL (ref 8–23)
CO2: 32 mmol/L (ref 22–32)
Calcium: 8.9 mg/dL (ref 8.9–10.3)
Chloride: 95 mmol/L — ABNORMAL LOW (ref 98–111)
Creatinine, Ser: 1.13 mg/dL (ref 0.61–1.24)
GFR calc Af Amer: >60 mL/min (ref >60)
GFR calc non Af Amer: >60 mL/min (ref >60)
Glucose, Bld: 103 mg/dL — ABNORMAL HIGH (ref 70–99)
Potassium: 3.2 mmol/L — ABNORMAL LOW (ref 3.5–5.1)
Sodium: 139 mmol/L (ref 135–145)

## 2018-12-09 LAB — PROTIME-INR
INR: 1.9 — ABNORMAL HIGH (ref 0.8–1.2)
Prothrombin Time: 21.8 seconds — ABNORMAL HIGH (ref 11.4–15.2)

## 2018-12-09 LAB — LIPID PANEL
Cholesterol: 103 mg/dL (ref 0–200)
HDL: 43 mg/dL (ref >40)
LDL Cholesterol: 49 mg/dL (ref 0–99)
Total CHOL/HDL Ratio: 2.4 RATIO
Triglycerides: 54 mg/dL (ref <150)
VLDL: 11 mg/dL (ref 0–40)

## 2018-12-09 LAB — TROPONIN I (HIGH SENSITIVITY): Troponin I (High Sensitivity): 29 ng/L — ABNORMAL HIGH (ref <18)

## 2018-12-09 LAB — BRAIN NATRIURETIC PEPTIDE: B Natriuretic Peptide: 352.9 pg/mL — ABNORMAL HIGH (ref 0.0–100.0)

## 2018-12-09 LAB — SARS CORONAVIRUS 2 (TAT 6-24 HRS): SARS Coronavirus 2: NEGATIVE

## 2018-12-09 LAB — HEPARIN LEVEL (UNFRACTIONATED): Heparin Unfractionated: 0.56 IU/mL (ref 0.30–0.70)

## 2018-12-09 MED ORDER — FUROSEMIDE 10 MG/ML IJ SOLN
40.0000 mg | Freq: Once | INTRAMUSCULAR | Status: AC
  Administered 2018-12-09: 40 mg via INTRAVENOUS
  Filled 2018-12-09: qty 4

## 2018-12-09 MED ORDER — ACETAMINOPHEN 325 MG PO TABS
650.0000 mg | ORAL_TABLET | ORAL | Status: DC | PRN
  Administered 2018-12-12: 650 mg via ORAL
  Filled 2018-12-09: qty 2

## 2018-12-09 MED ORDER — ATORVASTATIN CALCIUM 40 MG PO TABS
40.0000 mg | ORAL_TABLET | Freq: Every day | ORAL | Status: DC
  Administered 2018-12-09 – 2018-12-16 (×8): 40 mg via ORAL
  Filled 2018-12-09 (×8): qty 1

## 2018-12-09 MED ORDER — DILTIAZEM HCL ER COATED BEADS 180 MG PO CP24
180.0000 mg | ORAL_CAPSULE | Freq: Every day | ORAL | Status: DC
  Administered 2018-12-09 – 2018-12-11 (×3): 180 mg via ORAL
  Filled 2018-12-09 (×3): qty 1

## 2018-12-09 MED ORDER — ONDANSETRON HCL 4 MG/2ML IJ SOLN: 4.0000 mg | Freq: Four times a day (QID) | INTRAMUSCULAR | Status: DC | PRN

## 2018-12-09 MED ORDER — AMIODARONE LOAD VIA INFUSION
150.0000 mg | Freq: Once | INTRAVENOUS | Status: AC
  Administered 2018-12-09: 150 mg via INTRAVENOUS
  Filled 2018-12-09: qty 83.34

## 2018-12-09 MED ORDER — POTASSIUM CHLORIDE CRYS ER 20 MEQ PO TBCR
40.0000 meq | EXTENDED_RELEASE_TABLET | Freq: Once | ORAL | Status: AC
  Administered 2018-12-09: 40 meq via ORAL
  Filled 2018-12-09: qty 2

## 2018-12-09 MED ORDER — AMIODARONE HCL IN DEXTROSE 360-4.14 MG/200ML-% IV SOLN
30.0000 mg/h | INTRAVENOUS | Status: DC
  Administered 2018-12-09 – 2018-12-10 (×3): 30 mg/h via INTRAVENOUS
  Filled 2018-12-09 (×3): qty 200

## 2018-12-09 MED ORDER — AMIODARONE HCL IN DEXTROSE 360-4.14 MG/200ML-% IV SOLN
60.0000 mg/h | INTRAVENOUS | Status: DC
  Administered 2018-12-09: 60 mg/h via INTRAVENOUS
  Filled 2018-12-09 (×2): qty 200

## 2018-12-09 MED ORDER — HEPARIN (PORCINE) 25000 UT/250ML-% IV SOLN
1250.0000 [IU]/h | INTRAVENOUS | Status: DC
  Administered 2018-12-09 – 2018-12-10 (×2): 1500 [IU]/h via INTRAVENOUS
  Filled 2018-12-09 (×2): qty 250

## 2018-12-09 NOTE — Progress Notes (Signed)
ANTICOAGULATION CONSULT NOTE - Initial Consult  Pharmacy Consult for Heparin when INR is <2 Indication: atrial fibrillation  No Known Allergies  Vital Signs: Temp: 98.5 F (36.9 C) (09/23 2148) Temp Source: Oral (09/23 2148) BP: 111/88 (09/24 0600) Pulse Rate: 84 (09/24 0600)  Labs: Recent Labs    12/08/18 1518 12/08/18 1521 12/08/18 2029 12/09/18 0024 12/09/18 0530  HGB 12.7*  --   --   --  12.6*  HCT 41.3  --   --   --  41.6  PLT 159  --   --   --  169  LABPROT  --  22.7*  --   --  21.8*  INR  --  2.0*  --   --  1.9*  CREATININE 1.21  --   --   --  1.13  TROPONINIHS 24*  --  30* 29*  --     Estimated Creatinine Clearance: 77.5 mL/min (by C-G formula based on SCr of 1.13 mg/dL).   Medical History: Past Medical History:  Diagnosis Date  . Acute encephalopathy 12/12/2015  . Acute respiratory failure with hypoxia and hypercapnia ([place]) 12/12/2015  . Allergic rhinitis 02/13/2010   Qualifier: Diagnosis of  By: Wynona Luna   . Angina at rest [facility]) 10/22/2015  . Ataxia 08/10/2013  . B12 deficiency 08/11/2013  . BACK PAIN, LUMBAR 01/29/2009   Qualifier: Diagnosis of  By: Wynona Luna   . BENIGN POSITIONAL VERTIGO 11/23/2009   Qualifier: Diagnosis of  By: Wynona Luna   . Chronic diastolic heart failure ([place]) 04/24/2015  . Chronic pain    left sided-Kristeins  . Coronary 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 ([place]) 11/04/2015   s/p CABG 2004; CFX DES 2011  . Essential hypertension 12/12/2006   Qualifier: Diagnosis of  By: [name] RMA, Lucy    . GERD 12/12/2006   Qualifier: Diagnosis of  By: [name] RMA, Lucy    . GOUT 12/12/2006   Qualifier: Diagnosis of  By: Reatha Armour, Lucy    . Hyperlipidemia LDL goal <70 12/12/2006   Qualifier: Diagnosis of  By: [name] RMA, Lucy     . Hypogonadism male 04/10/2011  . Hypothyroidism, acquired 02/15/2013  . Insomnia 05/21/2013  . Long term current use of anticoagulant therapy 06/24/2011  . Obstructive sleep  apnea-Failed CPAP 06/17/2007   Failed CPAP Using O 2 for sleep   . Paroxysmal atrial fibrillation ([place]) 12/06/2016  . Stroke (cerebrum) ([place]) 01/13/2017   also 1996; resultant hemiplegia of dominant side  . Thoracic aorta atherosclerosis ([place]) 04/27/2017  . Tubular adenoma of colon 05/2010  . Type 2 diabetes mellitus without complication, without long-term current use of insulin ([place]) 10/05/2007   Qualifier: Diagnosis of  By: Wynona Luna   . Vasovagal syncope     Assessment: 73 y/o M with exertional shortness of breath, on warfarin PTA for afib, consulted to start heparin when INR is <2, INR earlier yesterday was 2  9/24 AM update  INR is 1.9 this AM, starting heparin  Goal of Therapy:  INR 2-3 Monitor platelets by anticoagulation protocol: Yes   Plan:  Start heparin at 1500 units/hr 1600 heparin level  Narda Bonds, PharmD, [place] Pharmacist Phone: [phone]

## 2018-12-09 NOTE — Progress Notes (Signed)
ANTICOAGULATION CONSULT NOTE - Initial Consult  Pharmacy Consult for Heparin when INR is <2 Indication: atrial fibrillation  No Known Allergies  Vital Signs: Temp: 98.5 F (36.9 C) (09/23 2148) Temp Source: Oral (09/23 2148) BP: 121/101 (09/24 0500) Pulse Rate: 96 (09/24 0445)  Labs: Recent Labs    12/08/18 1518 12/08/18 1521 12/08/18 2029 12/09/18 0024  HGB 12.7*  --   --   --   HCT 41.3  --   --   --   PLT 159  --   --   --   LABPROT  --  22.7*  --   --   INR  --  2.0*  --   --   CREATININE 1.21  --   --   --   TROPONINIHS 24*  --  30* 29*    Estimated Creatinine Clearance: 72.4 mL/min (by C-G formula based on SCr of 1.21 mg/dL).   Medical History: Past Medical History:  Diagnosis Date  . Acute encephalopathy 12/12/2015  . Acute respiratory failure with hypoxia and hypercapnia ([place]) 12/12/2015  . Allergic rhinitis 02/13/2010   Qualifier: Diagnosis of  By: Wynona Luna   . Angina at rest [facility]) 10/22/2015  . Ataxia 08/10/2013  . B12 deficiency 08/11/2013  . BACK PAIN, LUMBAR 01/29/2009   Qualifier: Diagnosis of  By: Wynona Luna   . BENIGN POSITIONAL VERTIGO 11/23/2009   Qualifier: Diagnosis of  By: Wynona Luna   . Chronic diastolic heart failure ([place]) 04/24/2015  . Chronic pain    left sided-Kristeins  . Coronary 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 ([place]) 11/04/2015   s/p CABG 2004; CFX DES 2011  . Essential hypertension 12/12/2006   Qualifier: Diagnosis of  By: [name] RMA, Lucy    . GERD 12/12/2006   Qualifier: Diagnosis of  By: [name] RMA, Lucy    . GOUT 12/12/2006   Qualifier: Diagnosis of  By: Reatha Armour, Lucy    . Hyperlipidemia LDL goal <70 12/12/2006   Qualifier: Diagnosis of  By: [name] RMA, Lucy     . Hypogonadism male 04/10/2011  . Hypothyroidism, acquired 02/15/2013  . Insomnia 05/21/2013  . Long term current use of anticoagulant therapy 06/24/2011  . Obstructive sleep apnea-Failed CPAP 06/17/2007   Failed CPAP Using O 2 for  sleep   . Paroxysmal atrial fibrillation ([place]) 12/06/2016  . Stroke (cerebrum) ([place]) 01/13/2017   also 1996; resultant hemiplegia of dominant side  . Thoracic aorta atherosclerosis ([place]) 04/27/2017  . Tubular adenoma of colon 05/2010  . Type 2 diabetes mellitus without complication, without long-term current use of insulin ([place]) 10/05/2007   Qualifier: Diagnosis of  By: Wynona Luna   . Vasovagal syncope     Assessment: 73 y/o M with exertional shortness of breath, on warfarin PTA for afib, consulted to start heparin when INR is <2, INR earlier yesterday was 2  Goal of Therapy:  INR 2-3 Monitor platelets by anticoagulation protocol: Yes   Plan:  Check INR now Start heparin when INR is <2  [name] 12/09/2018,5:15 AM

## 2018-12-09 NOTE — ED Provider Notes (Addendum)
[facility] EMERGENCY DEPARTMENT Provider Note   CSN: [id number] Arrival date & time: 12/08/18  1503    History   Chief Complaint Atrial fibrillation, shortness of breath  HPI [name] is a 73 y.o. male.   The history is provided by the patient.  He has history of hypertension, diabetes, hyperlipidemia, coronary artery disease, paroxysmal atrial fibrillation anticoagulated on warfarin, diastolic heart failure, stroke and was sent here from his primary care provider's office because of recurrent atrial fibrillation.  He states that he felt his heart go out of rhythm 4 days ago.  He has had about a 6 pound weight gain and is noted with worsening peripheral edema.  He has chronic dyspnea which is worse with laying supine.  He does not feel his dyspnea is getting any worse.  He denies chest pain, heaviness, tightness, pressure.  Past Medical History:  Diagnosis Date   Acute encephalopathy 12/12/2015   Acute respiratory failure with hypoxia and hypercapnia (HCC) 12/12/2015   Allergic rhinitis 02/13/2010   Qualifier: Diagnosis of  By: Wynona Luna    Angina at rest [facility]) 10/22/2015   Ataxia 08/10/2013   B12 deficiency 08/11/2013   BACK PAIN, LUMBAR 01/29/2009   Qualifier: Diagnosis of  By: Wynona Luna    BENIGN POSITIONAL VERTIGO 11/23/2009   Qualifier: Diagnosis of  By: Wynona Luna    Chronic diastolic heart failure ([place]) 04/24/2015   Chronic pain    left sided-Kristeins   Coronary 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 ([place]) 11/04/2015   s/p CABG 2004; CFX DES 2011   Essential hypertension 12/12/2006   Qualifier: Diagnosis of  By: [name] RMA, Lucy     GERD 12/12/2006   Qualifier: Diagnosis of  By: [name] RMA, [place]     GOUT 12/12/2006   Qualifier: Diagnosis of  By: [name] RMA, Lucy     Hyperlipidemia LDL goal <70 12/12/2006   Qualifier: Diagnosis of  By: [name] RMA, [place]      Hypogonadism male 04/10/2011    Hypothyroidism, acquired 02/15/2013   Insomnia 05/21/2013   Long term current use of anticoagulant therapy 06/24/2011   Obstructive sleep apnea-Failed CPAP 06/17/2007   Failed CPAP Using O 2 for sleep    Paroxysmal atrial fibrillation ([place]) 12/06/2016   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   also 1996; resultant hemiplegia of dominant side   Thoracic aorta atherosclerosis ([place]) 04/27/2017   Tubular adenoma of colon 05/2010   Type 2 diabetes mellitus without complication, without long-term current use of insulin ([place]) 10/05/2007   Qualifier: Diagnosis of  By: Wynona Luna    Vasovagal syncope     Patient Active Problem List   Diagnosis Date Noted   Congestive heart failure (HCC)    Abnormal INR    AKI (acute kidney injury) ([place])    History of gout    History of hypothyroidism    History of type 2 diabetes mellitus    Atrial fibrillation with rapid ventricular response ([place]) 11/27/2018   Ischemic chest pain ([place]) 02/17/2018   Pure hypercholesterolemia 05/01/2017   Thoracic aorta atherosclerosis ([place]) 04/27/2017   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   RUQ abdominal pain 01/13/2017   Paroxysmal atrial fibrillation ([place]) 12/06/2016   Dyspnea on exertion 10/21/2016   Acute respiratory failure with hypoxia and hypercapnia ([place]) 12/12/2015   Coronary 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 ([place]) 11/04/2015   CAD S/P CFX DES 2011 04/25/2015  Persistent atrial fibrillation 04/25/2015   Vasovagal syncope    Constipation 04/24/2015   Chronic diastolic heart failure ([place]) 04/24/2015   B12 deficiency 08/11/2013   Sensory disturbance 08/11/2013   Ataxia 08/10/2013   Laceration of ear, external, right 07/05/2013   Insomnia 05/21/2013   Hypothyroidism, acquired 02/15/2013   Weakness 11/17/2012   Spastic hemiplegia affecting dominant side ([place]) 07/21/2011   Long term current use of anticoagulant therapy 06/24/2011   Hypogonadism male  04/10/2011   Fatigue 03/31/2011   Allergic rhinitis 02/13/2010   BENIGN POSITIONAL VERTIGO 11/23/2009   Hx of CABG '04 05/04/2009   Hypothyroidism 04/02/2009   Back pain 01/29/2009   Type 2 diabetes mellitus without complication, without long-term current use of insulin ([place]) 10/05/2007   Obstructive sleep apnea-Failed CPAP 06/17/2007   Hyperlipidemia LDL goal <70 12/12/2006   GOUT 12/12/2006   Essential hypertension 12/12/2006   History of stroke with residual deficit 12/12/2006   GERD 12/12/2006    Past Surgical History:  Procedure Laterality Date   CARDIAC CATHETERIZATION N/A 10/25/2015   Procedure: Left Heart Cath and Cors/Grafts Angiography;  Surgeon: Troy Sine, MD;  Location: [place] CV LAB;  Service: Cardiovascular;  Laterality: N/A;   CARDIOVERSION  06/20/2011   Procedure: CARDIOVERSION;  Surgeon: Larey Dresser, MD;  Location: [facility] ENDOSCOPY;  Service: Cardiovascular;  Laterality: N/A;   CARDIOVERSION N/A 04/26/2015   Procedure: CARDIOVERSION;  Surgeon: Dorothy Spark, MD;  Location: [facility] ENDOSCOPY;  Service: Cardiovascular;  Laterality: N/A;   CARDIOVERSION N/A 05/10/2015   Procedure: CARDIOVERSION;  Surgeon: Larey Dresser, MD;  Location: [place];  Service: Cardiovascular;  Laterality: N/A;   CATARACT EXTRACTION  05/2010   left eye   CATARACT EXTRACTION  04/2010   right eye   CORONARY ARTERY BYPASS GRAFT     stent   ESOPHAGOGASTRODUODENOSCOPY  03-25-2005   LEFT HEART CATH AND CORS/GRAFTS ANGIOGRAPHY N/A 02/19/2018   Procedure: LEFT HEART CATH AND CORS/GRAFTS ANGIOGRAPHY;  Surgeon: Martinique, Peter M, MD;  Location: [place] CV LAB;  Service: Cardiovascular;  Laterality: N/A;   NASAL SEPTUM SURGERY     PERCUTANEOUS PLACEMENT INTRAVASCULAR STENT CERVICAL CAROTID ARTERY     03-2009; using a drug-eluting platform of the circumflex cornoray artery with a 3.0 x 18 [employer] Promus drug-eluting platform post dilated to 3.75 with a  noncompliant balloon.   TEE WITHOUT CARDIOVERSION  06/20/2011   Procedure: TRANSESOPHAGEAL ECHOCARDIOGRAM (TEE);  Surgeon: Larey Dresser, MD;  Location: [place];  Service: Cardiovascular;  Laterality: N/A;        Home Medications    Prior to Admission medications   Medication Sig Start Date End Date Taking? Authorizing Provider  ACCU-CHEK GUIDE test strip USE UP TO FOUR TIMES DAILY AS DIRECTED 09/07/18   Debbrah Alar, NP  allopurinol (ZYLOPRIM) 100 MG tablet TAKE 2 TABLETS BY MOUTH EVERY DAY Patient taking differently: Take 200 mg by mouth daily.  09/27/18   Debbrah Alar, NP  atorvastatin (LIPITOR) 40 MG tablet Take 1 tablet (40 mg total) by mouth daily. 09/01/18   End, Harrell Gave, MD  benazepril (LOTENSIN) 5 MG tablet Take 1 tablet (5 mg total) by mouth daily. 11/29/18   Elgergawy, Silver Huguenin, MD  blood glucose meter kit and supplies KIT Dispense based on patient and insurance preference. Use up to four times daily as directed. (FOR ICD-9 250.00, 250.01). 08/10/18   Debbrah Alar, NP  diltiazem (CARDIZEM CD) 180 MG 24 hr capsule Take 1 capsule (180 mg total) by  mouth daily. 11/30/18   Elgergawy, Silver Huguenin, MD  fluocinonide gel (LIDEX) 0.05 % Apply to scalp once daily as needed Patient taking differently: Apply 1 application topically daily as needed (itching).  05/11/18   Debbrah Alar, NP  fluticasone (FLONASE) 50 MCG/ACT nasal spray Place 2 sprays into both nostrils daily. 06/02/14   Brunetta Jeans, PA-C  furosemide (LASIX) 40 MG tablet TAKE 1 TABLET BY MOUTH EVERY OTHER DAY Patient taking differently: Take 40 mg by mouth every other day.  09/02/18   Debbrah Alar, NP  isosorbide mononitrate (IMDUR) 30 MG 24 hr tablet TAKE 1/2 TABLET BY MOUTH EVERY DAY Patient taking differently: Take 15 mg by mouth daily.  10/18/18   End, Harrell Gave, MD  levothyroxine (SYNTHROID, LEVOTHROID) 112 MCG tablet TAKE 1 TABLET (112 MCG TOTAL) BY MOUTH DAILY BEFORE BREAKFAST.  05/31/18   Debbrah Alar, NP  metFORMIN (GLUCOPHAGE) 500 MG tablet TAKE 1 TABLET (500 MG TOTAL) DAILY WITH BREAKFAST BY MOUTH. 10/04/18   Debbrah Alar, NP  Multiple Vitamins-Minerals (MULTIVITAMIN GUMMIES MENS PO) Take 1 tablet by mouth daily with breakfast.     [provider]  nitroGLYCERIN (NITROSTAT) 0.4 MG SL tablet Place 1 tablet (0.4 mg total) under the tongue every 5 (five) minutes as needed for chest pain. Up to 3 doses 03/05/15   Debbrah Alar, NP  OXYGEN Inhale 2 L into the lungs at bedtime.    [provider]  pantoprazole (PROTONIX) 40 MG tablet TAKE 1 TABLET (40 MG TOTAL) EVERY MORNING BY MOUTH. 11/02/18   Debbrah Alar, NP  senna-docusate (SENOKOT-S) 8.6-50 MG tablet Take 2 tablets by mouth 2 (two) times daily. 11/29/18   Elgergawy, Silver Huguenin, MD  traMADol (ULTRAM) 50 MG tablet TAKE 1 TABLET BY MOUTH TWICE A DAY 05/12/18   Kirsteins, Luanna Salk, MD  traMADol (ULTRAM-ER) 200 MG 24 hr tablet TAKE 1 TABLET BY MOUTH EVERY DAY 12/02/18   Kirsteins, Luanna Salk, MD  vitamin B-12 1000 MCG tablet Take 1 tablet (1,000 mcg total) by mouth daily. 08/11/13   Orson Eva, MD  warfarin (COUMADIN) 2.5 MG tablet Take 0.5-1 tablets (1.25-2.5 mg total) by mouth See admin instructions. Take 2.33m by mouth on MON WED FRI SAT, then take 1.268mon TUES THUR SUN Hold today and resume 9/16. 11/30/18   Elgergawy, DaSilver HugueninMD    Family History Family History  Problem Relation Age of Onset   Lung cancer Father        deceased   Stroke Mother        deceased-MINISTROKES    Social History Social History   Tobacco Use   Smoking status: Never Smoker   Smokeless tobacco: Never Used  Substance Use Topics   Alcohol use: No    Alcohol/week: 0.0 standard drinks    Comment: 1 beer a month   Drug use: No     Allergies   Patient has no known allergies.   Review of Systems Review of Systems  All other systems reviewed and are negative.    Physical  Exam Updated Vital Signs BP 122/77    Pulse 73    Temp 98.5 F (36.9 C) (Oral)    Resp 18    SpO2 96%   Physical Exam Vitals signs and nursing note reviewed.    [age]ear old male, resting comfortably and in no acute distress. Vital signs are normal. Oxygen saturation is 96%, which is normal. Head is normocephalic and atraumatic. PERRLA, EOMI. Oropharynx is clear. Neck is nontender  and supple without adenopathy or JVD. Back is nontender and there is no CVA tenderness. Lungs are clear without rales, wheezes, or rhonchi. Chest is nontender. Heart has an irregular rhythm without murmur. Abdomen is soft, flat, nontender without masses or hepatosplenomegaly and peristalsis is normoactive. Extremities have 1-2+ edema, full range of motion is present. Skin is warm and dry without rash. Neurologic: Mental status is normal, cranial nerves are intact, there are no motor or sensory deficits.  ED Treatments / Results  Labs (all labs ordered are listed, but only abnormal results are displayed) Labs Reviewed  BASIC METABOLIC PANEL - Abnormal; Notable for the following components:      Result Value   Glucose, Bld 143 (*)    GFR calc non Af Amer 58 (*)    All other components within normal limits  CBC - Abnormal; Notable for the following components:   Hemoglobin 12.7 (*)    All other components within normal limits  PROTIME-INR - Abnormal; Notable for the following components:   Prothrombin Time 22.7 (*)    INR 2.0 (*)    All other components within normal limits  TROPONIN I (HIGH SENSITIVITY) - Abnormal; Notable for the following components:   Troponin I (High Sensitivity) 24 (*)    All other components within normal limits  TROPONIN I (HIGH SENSITIVITY) - Abnormal; Notable for the following components:   Troponin I (High Sensitivity) 30 (*)    All other components within normal limits    EKG EKG Interpretation  Date/Time:  Wednesday December 08 2018 15:09:24 EDT Ventricular Rate:   109 PR Interval:    QRS Duration: 96 QT Interval:  360 QTC Calculation: 484 R Axis:   -137 Text Interpretation:  Atrial fibrillation with rapid ventricular response Right superior axis deviation Nonspecific T wave abnormality Abnormal ECG Probable limb lead reversal When compared with ECG of 11/28/2018, Abnormal right superior axis deviation is now present - probably artifact from limb lead reversal Confirmed by Delora Fuel ([id number]) on 12/08/2018 11:38:28 PM   EKG Interpretation  Date/Time:  Thursday December 09 2018 00:29:25 EDT Ventricular Rate:  115 PR Interval:    QRS Duration: 82 QT Interval:  384 QTC Calculation: 532 R Axis:   -33 Text Interpretation:  Atrial fibrillation LVH with secondary repolarization abnormality Prolonged QT interval When compared with ECG of 12/08/2018, Limb lead reversal has been corrected Confirmed by Delora Fuel ([id number]) on 12/09/2018 12:34:08 AM       Radiology Dg Chest 2 View  Result Date: 12/08/2018 CLINICAL DATA:  Reason for exam: cp/sob/edema Patient reports came to hospital last week for similar symptoms. Reports sob, left chest pains, left lower leg swelling. EXAM: CHEST - 2 VIEW COMPARISON:  Chest radiograph 03/28/2018, 02/17/2018 FINDINGS: Stable cardiomediastinal contours status post median sternotomy. The heart size is enlarged. Chronic elevation of the right hemidiaphragm. There is a band like linear opacity at the right lung base likely representing atelectasis. The left lung is clear. No pneumothorax or pleural effusion. No acute finding in the visualized skeleton. IMPRESSION: Bandlike opacity at the right lung base likely representing atelectasis. No focal infiltrate or evidence of edema. Electronically Signed   By: Audie Pinto M.D.   On: 12/08/2018 15:40    Procedures Procedures    Medications Ordered in ED Medications  sodium chloride flush (NS) 0.9 % injection 3 mL (has no administration in time range)     Initial Impression  / Assessment and Plan / ED Course  I have reviewed the  triage vital signs and the nursing notes.  Pertinent labs & imaging results that were available during my care of the patient were reviewed by me and considered in my medical decision making (see chart for details).  Atrial fibrillation.  Chronic diastolic heart failure with exacerbation.  Old records reviewed confirming office visit earlier today with instructions to come to the hospital to be admitted and be started on amiodarone.  ECG shows atrial fibrillation, question limb lead reversal as there is new right superior axis deviation.  Chest x-ray shows no acute process.  INR is therapeutic.  Mild anemia is present which is actually improved compared with recent value.  Troponin is mildly elevated and increasing, will check a third troponin.  Elevated troponin is felt to be secondary to atrial fibrillation and not to represent ACS.  Case is discussed with Dr. Paticia Stack of cardiology service who agrees to admit the patient.  ECG is repeated and arm lead reversal has been corrected.  Final Clinical Impressions(s) / ED Diagnoses   Final diagnoses:  Atrial fibrillation with rapid ventricular response (HCC)  Anticoagulated on warfarin  Normochromic normocytic anemia  Acute on chronic diastolic heart failure ([place])  Elevated troponin    ED Discharge Orders    None       Delora Fuel, MD 35/67/01 [id number]    Delora Fuel, MD 04/16/41 [phone]

## 2018-12-09 NOTE — ED Notes (Signed)
RN spoke to pts daughter on the phone and gave her an update.

## 2018-12-09 NOTE — Progress Notes (Signed)
ANTICOAGULATION CONSULT NOTE   Pharmacy Consult for Heparin  Indication: atrial fibrillation  No Known Allergies  Vital Signs: Temp: 97.6 F (36.4 C) (09/24 1253) Temp Source: Oral (09/24 1253) BP: 151/112 (09/24 1253) Pulse Rate: 98 (09/24 1253)  Labs: Recent Labs    12/08/18 1518 12/08/18 1521 12/08/18 2029 12/09/18 0024 12/09/18 0530 12/09/18 1549  HGB 12.7*  --   --   --  12.6*  --   HCT 41.3  --   --   --  41.6  --   PLT 159  --   --   --  169  --   LABPROT  --  22.7*  --   --  21.8*  --   INR  --  2.0*  --   --  1.9*  --   HEPARINUNFRC  --   --   --   --   --  0.56  CREATININE 1.21  --   --   --  1.13  --   TROPONINIHS 24*  --  30* 29*  --   --     Estimated Creatinine Clearance: 75.1 mL/min (by C-G formula based on SCr of 1.13 mg/dL).   Medical History: Past Medical History:  Diagnosis Date  . Acute encephalopathy 12/12/2015  . Acute respiratory failure with hypoxia and hypercapnia ([place]) 12/12/2015  . Allergic rhinitis 02/13/2010   Qualifier: Diagnosis of  By: Wynona Luna   . Angina at rest [facility]) 10/22/2015  . Ataxia 08/10/2013  . Atrial fibrillation ([place]) 11/2018  . B12 deficiency 08/11/2013  . BACK PAIN, LUMBAR 01/29/2009   Qualifier: Diagnosis of  By: Wynona Luna   . BENIGN POSITIONAL VERTIGO 11/23/2009   Qualifier: Diagnosis of  By: Wynona Luna   . Chronic diastolic heart failure ([place]) 04/24/2015  . Chronic pain    left sided-Kristeins  . Coronary 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 ([place]) 11/04/2015   s/p CABG 2004; CFX DES 2011  . Essential hypertension 12/12/2006   Qualifier: Diagnosis of  By: [name] RMA, [place]    . GERD 12/12/2006   Qualifier: Diagnosis of  By: [name] RMA, Lucy    . GOUT 12/12/2006   Qualifier: Diagnosis of  By: Reatha Armour, Lucy    . Hyperlipidemia LDL goal <70 12/12/2006   Qualifier: Diagnosis of  By: [name] RMA, [place]     . Hypogonadism male 04/10/2011  . Hypothyroidism, acquired 02/15/2013  .  Insomnia 05/21/2013  . Long term current use of anticoagulant therapy 06/24/2011  . Obstructive sleep apnea-Failed CPAP 06/17/2007   Failed CPAP Using O 2 for sleep   . Paroxysmal atrial fibrillation ([place]) 12/06/2016  . 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 Thoracic aorta atherosclerosis ([place]) 04/27/2017  . Tubular adenoma of colon 05/2010  . Type 2 diabetes mellitus without complication, without long-term current use of insulin ([place]) 10/05/2007   Qualifier: Diagnosis of  By: Wynona Luna   . Vasovagal syncope     Assessment: 73 y/o M with exertional shortness of breath, on warfarin PTA for afib, consulted to start heparin when INR is <2. INR was down to 1.9 this am, heparin was started per protocol.   Initial heparin level at goal (0.56) on 1500 units/hr. No bleeding issues noted, scheduled for DCCV tomorrow.   9/24 AM update  INR is 1.9 this AM, starting heparin  Goal of Therapy:  Heparin  level 0.3-0.7 units/ml Monitor platelets by anticoagulation protocol: Yes   Plan:  Continue heparin at 1500 units/hr Confirmatory level with am labs  Erin Hearing PharmD., BCPS Clinical Pharmacist 12/09/2018 5:06 PM

## 2018-12-09 NOTE — Progress Notes (Signed)
   Seen in the ER - remains in afib on amiodarone. Not able to accommodate DCCV today. Ok to have diet. Keep NPO p MN - ordered potassium repletion. Scheduled for DCCV tomorrow at 1:30 pm.  Pixie Casino, MD, FACC, [place] Director of the [facility] Lip[facility]  Card[facility]ovascular [facility] Diplomate of the American Board of Clinical Lipidology Attending Cardiologist  Direct Dial: [phone]  Fax: [phone]  Website:  [email or web address]

## 2018-12-09 NOTE — H&P (Signed)
Cardiology Admission History and Physical:   Patient ID: [name]; MRN: [id number]; DOB: 1945/08/17   Admission date: 12/08/2018  Primary Care Provider:  Debbrah Alar, NP Primary Cardiologist:  Candee Furbish, MD   Chief Complaint: Exertional shortness of breath and leg swelling  History of Present Illness:   [name] is a 73 y.o. male with a history of CAD (s/p CABG in 2004, DES to LCx in 2011), paroxysmal atrial fibrillation (on Coumadin), hypertension, hyperlipidemia, diabetes mellitus, diastolic heart failure, previous CVA with residual left-sided deficit and obstructive sleep apnea (on nighttime oxygen 2 L ) who is being admitted to the hospital on the recommendation of his primary care physician for recurrent atrial fibrillation.  The patient has noted recent weight gain and worsening lower extremity swelling for the past few days.  He denies any chest pain.  He has had exertional shortness of breath.  He denies any dietary indiscretion.  The patient has struggled with control of his atrial fibrillation.  He was recently hospitalized for A. fib with RVR and was discharged home on diltiazem CD at 180 mg daily.  In the ED yesterday, his blood pressure was mildly elevated at 141/86 mmHg.  Heart rate was between 105 to 109 bpm.  The high-sensitivity troponins were 24 and 30.  White cell count was 5.7, hemoglobin 12.7, hematocrit 41.3 and platelets of 159.  The INR was 2.0.  Chest x-ray did not reveal any pulmonary edema.  There was atelectasis at the right lung base.  The electrocardiogram did not reveal any ischemic changes.   Recent cardiac studies CATH 02/19/2018 1. Left dominant circulation 2. Severe 3 vessel occlusive CAD. - 95% ostial LAD - 100% first diagonal - 100% ostial LCx. This is new since 2017 - was 95% prior - 100% nondominant RCA 3. Patent LIMA to the LAD. There is chronic severe disease in the apical LAD 4. Patent SVG to the first  diagonal and SVG to the first OM as a Y graft. 5. Chronic occlusion of the SVG to terminal LCx 6. Normal LVEDP  ECHO 02/18/2018  - Left ventricle: The cavity size was normal. There was mild focal basal hypertrophy of the septum. Systolic function was normal. The estimated ejection fraction was in the range of 55% to 60%. Wall motion was normal; there were no regional wall motion abnormalities. Doppler parameters are consistent with abnormal left ventricular relaxation (grade 1 diastolic dysfunction). Doppler parameters are consistent with elevated ventricular end-diastolic filling pressure. - Mitral valve: Calcified annulus. Mildly thickened leaflets . There was mild regurgitation. - Left atrium: The atrium was mildly dilated. - Right ventricle: The cavity size was mildly dilated. Wall thickness was normal. Systolic function was mildly reduced. - Right atrium: The atrium was normal in size. - Tricuspid valve: There was mild regurgitation. - Pulmonary arteries: Systolic pressure was within the normal range. - Inferior vena cava: The vessel was normal in size. - Pericardium, extracardiac: There was no pericardial effusion.   Past Medical History:  Diagnosis Date  . Acute encephalopathy 12/12/2015  . Acute respiratory failure with hypoxia and hypercapnia ([place]) 12/12/2015  . Allergic rhinitis 02/13/2010   Qualifier: Diagnosis of  By: Wynona Luna   . Angina at rest [facility]) 10/22/2015  . Ataxia 08/10/2013  . B12 deficiency 08/11/2013  . BACK PAIN, LUMBAR 01/29/2009   Qualifier: Diagnosis of  By: Wynona Luna   . BENIGN POSITIONAL VERTIGO 11/23/2009   Qualifier: Diagnosis of  By: Shawna Orleans DO,  Sandy Salaam   . Chronic diastolic heart failure ([place]) 04/24/2015  . Chronic pain    left sided-[place]  . Coronary 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 ([place]) 11/04/2015   s/p CABG 2004; CFX DES 2011  . Essential hypertension 12/12/2006   Qualifier:  Diagnosis of  By: [name] RMA, Lucy    . GERD 12/12/2006   Qualifier: Diagnosis of  By: [name] RMA, Lucy    . GOUT 12/12/2006   Qualifier: Diagnosis of  By: Reatha Armour, Lucy    . Hyperlipidemia LDL goal <70 12/12/2006   Qualifier: Diagnosis of  By: [name] RMA, Lucy     . Hypogonadism male 04/10/2011  . Hypothyroidism, acquired 02/15/2013  . Insomnia 05/21/2013  . Long term current use of anticoagulant therapy 06/24/2011  . Obstructive sleep apnea-Failed CPAP 06/17/2007   Failed CPAP Using O 2 for sleep   . Paroxysmal atrial fibrillation ([place]) 12/06/2016  . Stroke (cerebrum) ([place]) 01/13/2017   also 1996; resultant hemiplegia of dominant side  . Thoracic aorta atherosclerosis ([place]) 04/27/2017  . Tubular adenoma of colon 05/2010  . Type 2 diabetes mellitus without complication, without long-term current use of insulin ([place]) 10/05/2007   Qualifier: Diagnosis of  By: Wynona Luna   . Vasovagal syncope     Past Surgical History:  Procedure Laterality Date  . CARDIAC CATHETERIZATION N/A 10/25/2015   Procedure: Left Heart Cath and Cors/Grafts Angiography;  Surgeon: Troy Sine, MD;  Location: [place] CV LAB;  Service: Cardiovascular;  Laterality: N/A;  . CARDIOVERSION  06/20/2011   Procedure: CARDIOVERSION;  Surgeon: Larey Dresser, MD;  Location: [facility] ENDOSCOPY;  Service: Cardiovascular;  Laterality: N/A;  . CARDIOVERSION N/A 04/26/2015   Procedure: CARDIOVERSION;  Surgeon: Dorothy Spark, MD;  Location: [facility] ENDOSCOPY;  Service: Cardiovascular;  Laterality: N/A;  . CARDIOVERSION N/A 05/10/2015   Procedure: CARDIOVERSION;  Surgeon: Larey Dresser, MD;  Location: [place];  Service: Cardiovascular;  Laterality: N/A;  . CATARACT EXTRACTION  05/2010   left eye  . CATARACT EXTRACTION  04/2010   right eye  . CORONARY ARTERY BYPASS GRAFT     stent  . ESOPHAGOGASTRODUODENOSCOPY  03-25-2005  . LEFT HEART CATH AND CORS/GRAFTS ANGIOGRAPHY N/A 02/19/2018   Procedure: LEFT HEART CATH AND CORS/GRAFTS  ANGIOGRAPHY;  Surgeon: Martinique, Peter M, MD;  Location: [place] CV LAB;  Service: Cardiovascular;  Laterality: N/A;  . NASAL SEPTUM SURGERY    . PERCUTANEOUS PLACEMENT INTRAVASCULAR STENT CERVICAL CAROTID ARTERY     03-2009; using a drug-eluting platform of the circumflex cornoray artery with a 3.0 x 18 [employer] Promus drug-eluting platform post dilated to 3.75 with a noncompliant balloon.  . TEE WITHOUT CARDIOVERSION  06/20/2011   Procedure: TRANSESOPHAGEAL ECHOCARDIOGRAM (TEE);  Surgeon: Larey Dresser, MD;  Location: [place];  Service: Cardiovascular;  Laterality: N/A;     Medications Prior to Admission: Prior to Admission medications   Medication Sig Start Date End Date Taking? Authorizing Provider  ACCU-CHEK GUIDE test strip USE UP TO FOUR TIMES DAILY AS DIRECTED 09/07/18   Debbrah Alar, NP  allopurinol (ZYLOPRIM) 100 MG tablet TAKE 2 TABLETS BY MOUTH EVERY DAY Patient taking differently: Take 200 mg by mouth daily.  09/27/18   Debbrah Alar, NP  atorvastatin (LIPITOR) 40 MG tablet Take 1 tablet (40 mg total) by mouth daily. 09/01/18   End, Harrell Gave, MD  benazepril (LOTENSIN) 5 MG tablet Take 1 tablet (5 mg total) by mouth daily. 11/29/18  Elgergawy, Silver Huguenin, MD  blood glucose meter kit and supplies KIT Dispense based on patient and insurance preference. Use up to four times daily as directed. (FOR ICD-9 250.00, 250.01). 08/10/18   Debbrah Alar, NP  diltiazem (CARDIZEM CD) 180 MG 24 hr capsule Take 1 capsule (180 mg total) by mouth daily. 11/30/18   Elgergawy, Silver Huguenin, MD  fluocinonide gel (LIDEX) 0.05 % Apply to scalp once daily as needed Patient taking differently: Apply 1 application topically daily as needed (itching).  05/11/18   Debbrah Alar, NP  fluticasone (FLONASE) 50 MCG/ACT nasal spray Place 2 sprays into both nostrils daily. 06/02/14   Brunetta Jeans, PA-C  furosemide (LASIX) 40 MG tablet TAKE 1 TABLET BY MOUTH EVERY OTHER DAY Patient  taking differently: Take 40 mg by mouth every other day.  09/02/18   Debbrah Alar, NP  isosorbide mononitrate (IMDUR) 30 MG 24 hr tablet TAKE 1/2 TABLET BY MOUTH EVERY DAY Patient taking differently: Take 15 mg by mouth daily.  10/18/18   End, Harrell Gave, MD  levothyroxine (SYNTHROID, LEVOTHROID) 112 MCG tablet TAKE 1 TABLET (112 MCG TOTAL) BY MOUTH DAILY BEFORE BREAKFAST. 05/31/18   Debbrah Alar, NP  metFORMIN (GLUCOPHAGE) 500 MG tablet TAKE 1 TABLET (500 MG TOTAL) DAILY WITH BREAKFAST BY MOUTH. 10/04/18   Debbrah Alar, NP  Multiple Vitamins-Minerals (MULTIVITAMIN GUMMIES MENS PO) Take 1 tablet by mouth daily with breakfast.     [provider]  nitroGLYCERIN (NITROSTAT) 0.4 MG SL tablet Place 1 tablet (0.4 mg total) under the tongue every 5 (five) minutes as needed for chest pain. Up to 3 doses 03/05/15   Debbrah Alar, NP  OXYGEN Inhale 2 L into the lungs at bedtime.    [provider]  pantoprazole (PROTONIX) 40 MG tablet TAKE 1 TABLET (40 MG TOTAL) EVERY MORNING BY MOUTH. 11/02/18   Debbrah Alar, NP  senna-docusate (SENOKOT-S) 8.6-50 MG tablet Take 2 tablets by mouth 2 (two) times daily. 11/29/18   Elgergawy, Silver Huguenin, MD  traMADol (ULTRAM) 50 MG tablet TAKE 1 TABLET BY MOUTH TWICE A DAY 05/12/18   Kirsteins, Luanna Salk, MD  traMADol (ULTRAM-ER) 200 MG 24 hr tablet TAKE 1 TABLET BY MOUTH EVERY DAY 12/02/18   Kirsteins, Luanna Salk, MD  vitamin B-12 1000 MCG tablet Take 1 tablet (1,000 mcg total) by mouth daily. 08/11/13   Orson Eva, MD  warfarin (COUMADIN) 2.5 MG tablet Take 0.5-1 tablets (1.25-2.5 mg total) by mouth See admin instructions. Take 2.62m by mouth on MON WED FRI SAT, then take 1.259mon TUMarshalloday and resume 9/16. 11/30/18   Elgergawy, DaSilver HugueninMD     Allergies:   No Known Allergies  Social History:   Social History   Socioeconomic History  . Marital status: Married    Spouse name: GlPeter Congo. Number of children: Not on  file  . Years of education: 1491. Highest education level: Not on file  Occupational History  . Occupation: retired    EmFish farm managerOTHER    Comment: aiMining engineerSocial Needs  . Financial resource strain: Not on file  . Food insecurity    Worry: Not on file    Inability: Not on file  . Transportation needs    Medical: Not on file    Non-medical: Not on file  Tobacco Use  . Smoking status: Never Smoker  . Smokeless tobacco: Never Used  Substance and Sexual Activity  . Alcohol use: No  Alcohol/week: 0.0 standard drinks    Comment: 1 beer a month  . Drug use: No  . Sexual activity: Not Currently  Lifestyle  . Physical activity    Days per week: Not on file    Minutes per session: Not on file  . Stress: Not on file  Relationships  . Social Herbalist on phone: Not on file    Gets together: Not on file    Attends religious service: Not on file    Active member of club or organization: Not on file    Attends meetings of clubs or organizations: Not on file    Relationship status: Not on file  . Intimate partner violence    Fear of current or ex partner: Not on file    Emotionally abused: Not on file    Physically abused: Not on file    Forced sexual activity: Not on file  Other Topics Concern  . Not on file  Social History Narrative   Pt lives with wife. Does have stairs, but patient doesn't use them. Pt has completed technical school     Family History:  The patient's family history includes Lung cancer in his father; Stroke in his mother.     Review of Systems: [y] = yes, [ ]  = no   . General: Weight gain [Y ]; Weight loss [ ] ; Anorexia [ ] ; Fatigue [Y ]; Fever [ ] ; Chills [ ] ; Weakness [ ]   . Cardiac: Chest pain/pressure [ ] ; Resting SOB [ ] ; Exertional SOB [ ] ; Orthopnea [ ] ; Pedal Edema [Y ]; Palpitations [Y ]; Syncope [ ] ; Presyncope [ ] ; Paroxysmal nocturnal dyspnea[ ]   . Pulmonary: Cough [ ] ; Wheezing[ ] ; Hemoptysis[ ] ; Sputum [ ] ; Snoring [ ]    . GI: Vomiting[ ] ; Dysphagia[ ] ; Melena[ ] ; Hematochezia [ ] ; Heartburn[ ] ; Abdominal pain [ ] ; Constipation [ ] ; Diarrhea [ ] ; BRBPR [ ]   . GU: Hematuria[ ] ; Dysuria [ ] ; Nocturia[ ]   . Vascular: Pain in legs with walking [ ] ; Pain in feet with lying flat [ ] ; Non-healing sores [ ] ; Stroke [ ] ; TIA [ ] ; Slurred speech [ ] ;  . Neuro: Headaches[ ] ; Vertigo[ ] ; Seizures[ ] ; Paresthesias[ ] ;Blurred vision [ ] ; Diplopia [ ] ; Vision changes [ ]   . Ortho/Skin: Arthritis [ ] ; Joint pain [ ] ; Muscle pain [ ] ; Joint swelling [ ] ; Back Pain [ ] ; Rash [ ]   . Psych: Depression[ ] ; Anxiety[ ]   . Heme: Bleeding problems [ ] ; Clotting disorders [ ] ; Anemia [ ]   . Endocrine: Diabetes [ ] ; Thyroid dysfunction[ ]      Physical Exam/Data:   Vitals:   12/08/18 1513 12/08/18 1734 12/08/18 1953 12/08/18 2148  BP: 117/75 (!) 141/86 125/82 122/77  Pulse: (!) 105 (!) 50 91 73  Resp: 20 20 18 18   Temp: 98.2 F (36.8 C)  98.3 F (36.8 C) 98.5 F (36.9 C)  TempSrc: Oral  Oral Oral  SpO2: 97% 98% 98% 96%   No intake or output data in the 24 hours ending 12/09/18 0042 There were no vitals filed for this visit. There is no height or weight on file to calculate BMI.  General:  Well nourished, well developed, in no acute distress HEENT: normal Lymph: no adenopathy Neck: no JVD Endocrine:  No thryomegaly Vascular: No carotid bruits; FA pulses 2+ bilaterally without bruits  Cardiac: Irregularly irregular rhythm Lungs:  clear to auscultation bilaterally, no wheezing, rhonchi or rales  Abd: soft, nontender, no hepatomegaly  Ext: 1+ edema of the legs bilaterally (left > right) Musculoskeletal:  No deformities, BUE and BLE strength normal and equal Skin: warm and dry  Neuro:  CNs 2-12 intact, no focal abnormalities noted Psych:  Normal affect    EKG:  The ECG that was done on 12/08/2018 was personally reviewed and demonstrates atrial fibrillation with rapid ventricular response (rate 109 bpm), nonspecific T wave  abnormality   Laboratory Data:  Chemistry Recent Labs  Lab 12/08/18 1518  NA 139  K 3.5  CL 99  CO2 28  GLUCOSE 143*  BUN 14  CREATININE 1.21  CALCIUM 9.2  GFRNONAA 58*  GFRAA >60  ANIONGAP 12    No results for input(s): PROT, ALBUMIN, AST, ALT, ALKPHOS, BILITOT in the last 168 hours. Hematology Recent Labs  Lab 12/08/18 1518  WBC 5.7  RBC 4.55  HGB 12.7*  HCT 41.3  MCV 90.8  MCH 27.9  MCHC 30.8  RDW 14.1  PLT 159   Cardiac EnzymesNo results for input(s): TROPONINI in the last 168 hours. No results for input(s): TROPIPOC in the last 168 hours.  BNPNo results for input(s): BNP, PROBNP in the last 168 hours.  DDimer No results for input(s): DDIMER in the last 168 hours.  Radiology/Studies:  Dg Chest 2 View  Result Date: 12/08/2018 CLINICAL DATA:  Reason for exam: cp/sob/edema Patient reports came to hospital last week for similar symptoms. Reports sob, left chest pains, left lower leg swelling. EXAM: CHEST - 2 VIEW COMPARISON:  Chest radiograph 03/28/2018, 02/17/2018 FINDINGS: Stable cardiomediastinal contours status post median sternotomy. The heart size is enlarged. Chronic elevation of the right hemidiaphragm. There is a band like linear opacity at the right lung base likely representing atelectasis. The left lung is clear. No pneumothorax or pleural effusion. No acute finding in the visualized skeleton. IMPRESSION: Bandlike opacity at the right lung base likely representing atelectasis. No focal infiltrate or evidence of edema. Electronically Signed   By: Audie Pinto M.D.   On: 12/08/2018 15:40    Assessment and Plan:   1. Atrial fibrillation with rapid ventricular response The patient has done poorly over the past 2 weeks.  He failed a cardioversion on his last hospitalization.  He was discharged to home on diltiazem daily.  He reports shortness of breath on minimal exertion.  On this admission his EKG reveals atrial fibrillation with a heart rate in the  110s.  -Monitor closely on telemetry -Continue diltiazem CD 180 mg daily -Hold Coumadin -Start IV unfractionated heparin when INR less than 2.0 -Amiodarone IV bolus and infusion (per Dr. Olin Pia recommendations) -Keep the patient n.p.o.  2.  Chronic diastolic heart failure The patient presents with increasing leg swelling and weight gain.  -Strict I and O's -Daily weights -IV Lasix as needed -Maintain serum potassium greater than 4.0 and serum magnesium greater than 2.0  3.  Coronary artery disease status post CABG High-sensitivity troponin mildly elevated (24 and 30).  Unlikely to be due to ACS.  On a cardiac catheterization in December 2019 he was found to have 3 out of 4 grafts that were still patent.  The SVG to the left circumflex was occluded.  An echocardiogram around the same time revealed a preserved left ventricular systolic function.   -Continue to cycle cardiac biomarkers -Serial ECGs -Check a lipid panel -Continue atorvastatin 40 mg daily -Hold metformin   Severity of Illness: The appropriate patient status for this patient is INPATIENT. Inpatient status is  judged to be reasonable and necessary in order to provide the required intensity of service to ensure the patient's safety. The patient's presenting symptoms, physical exam findings, and initial radiographic and laboratory data in the context of their chronic comorbidities is felt to place them at high risk for further clinical deterioration. Furthermore, it is not anticipated that the patient will be medically stable for discharge from the hospital within 2 midnights of admission. The following factors support the patient status of inpatient.   " The patient's presenting symptoms include palpitations and leg swelling. " The worrisome physical exam findings include tachycardia. " The initial radiographic and laboratory data are worrisome because of ECG evidence of atrial fibrillation. " The chronic co-morbidities  include hypertension, hyperlipidemia and diabetes mellitus.   * I certify that at the point of admission it is my clinical judgment that the patient will require inpatient hospital care spanning beyond 2 midnights from the point of admission due to high intensity of service, high risk for further deterioration and high frequency of surveillance required.*    For questions or updates, please contact [facility] Please consult [email or web address] for contact info under Cardiology/STEMI.    Signed, [name] Maw, MD  12/09/2018 12:42 AM

## 2018-12-09 NOTE — ED Notes (Signed)
Pt placed on 2L [place] per pt request. Pt says he wears 2L at night at home.

## 2018-12-09 NOTE — ED Notes (Signed)
ED TO INPATIENT HANDOFF REPORT  ED Nurse Name and Phone #:   S Name/Age/Gender [name] 73 y.o. male Room/Bed: 033C/033C  Code Status   Code Status: Full Code  Home/SNF/Other Home Patient oriented to: self, place, time and situation Is this baseline? Yes   Triage Complete: Triage complete  Chief Complaint edema in leg  Triage Note Patient states that he was sent by cardiologist for increased lower left leg swelling and discomfort. Patient also states that he is to be converted with medication change.   Allergies No Known Allergies  Level of Care/Admitting Diagnosis ED Disposition    ED Disposition Condition [facility] Area: [place] [[id number]]  Level of Care: Telemetry Cardiac [103]  Covid Evaluation: Asymptomatic Screening Protocol (No Symptoms)  Diagnosis: Atrial fibrillation with rapid ventricular response [facility]) [id number]  Admitting Physician: [name] Maw [id number]  Attending Physician: Arnoldo Lenis [phone]  Estimated length of stay: 3 - 4 days  Certification:: I certify this patient will need inpatient services for at least 2 midnights  PT Class (Do Not Modify): Inpatient [101]  PT Acc Code (Do Not Modify): Private [1]       B Medical/Surgery History Past Medical History:  Diagnosis Date  . Acute encephalopathy 12/12/2015  . Acute respiratory failure with hypoxia and hypercapnia ([place]) 12/12/2015  . Allergic rhinitis 02/13/2010   Qualifier: Diagnosis of  By: Wynona Luna   . Angina at rest [facility]) 10/22/2015  . Ataxia 08/10/2013  . B12 deficiency 08/11/2013  . BACK PAIN, LUMBAR 01/29/2009   Qualifier: Diagnosis of  By: Wynona Luna   . BENIGN POSITIONAL VERTIGO 11/23/2009   Qualifier: Diagnosis of  By: Wynona Luna   . Chronic diastolic heart failure ([place]) 04/24/2015  . Chronic pain    left sided-Kristeins  . Coronary 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 ([place]) 11/04/2015    s/p CABG 2004; CFX DES 2011  . Essential hypertension 12/12/2006   Qualifier: Diagnosis of  By: [name] RMA, Lucy    . GERD 12/12/2006   Qualifier: Diagnosis of  By: [name] RMA, Lucy    . GOUT 12/12/2006   Qualifier: Diagnosis of  By: Reatha Armour, Lucy    . Hyperlipidemia LDL goal <70 12/12/2006   Qualifier: Diagnosis of  By: [name] RMA, Lucy     . Hypogonadism male 04/10/2011  . Hypothyroidism, acquired 02/15/2013  . Insomnia 05/21/2013  . Long term current use of anticoagulant therapy 06/24/2011  . Obstructive sleep apnea-Failed CPAP 06/17/2007   Failed CPAP Using O 2 for sleep   . Paroxysmal atrial fibrillation ([place]) 12/06/2016  . 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 Thoracic aorta atherosclerosis ([place]) 04/27/2017  . Tubular adenoma of colon 05/2010  . Type 2 diabetes mellitus without complication, without long-term current use of insulin ([place]) 10/05/2007   Qualifier: Diagnosis of  By: Wynona Luna   . Vasovagal syncope    Past Surgical History:  Procedure Laterality Date  . CARDIAC CATHETERIZATION N/A 10/25/2015   Procedure: Left Heart Cath and Cors/Grafts Angiography;  Surgeon: Troy Sine, MD;  Location: [place] CV LAB;  Service: Cardiovascular;  Laterality: N/A;  . CARDIOVERSION  06/20/2011   Procedure: CARDIOVERSION;  Surgeon: Larey Dresser, MD;  Location: [facility] ENDOSCOPY;  Service: Cardiovascular;  Laterality: N/A;  . CARDIOVERSION N/A 04/26/2015   Procedure: CARDIOVERSION;  Surgeon: Houston Siren  Hazel Sams, MD;  Location: [facility] ENDOSCOPY;  Service: Cardiovascular;  Laterality: N/A;  . CARDIOVERSION N/A 05/10/2015   Procedure: CARDIOVERSION;  Surgeon: Larey Dresser, MD;  Location: [place];  Service: Cardiovascular;  Laterality: N/A;  . CATARACT EXTRACTION  05/2010   left eye  . CATARACT EXTRACTION  04/2010   right eye  . CORONARY ARTERY BYPASS GRAFT     stent  . ESOPHAGOGASTRODUODENOSCOPY  03-25-2005  . LEFT HEART CATH AND CORS/GRAFTS  ANGIOGRAPHY N/A 02/19/2018   Procedure: LEFT HEART CATH AND CORS/GRAFTS ANGIOGRAPHY;  Surgeon: Martinique, Peter M, MD;  Location: [place] CV LAB;  Service: Cardiovascular;  Laterality: N/A;  . NASAL SEPTUM SURGERY    . PERCUTANEOUS PLACEMENT INTRAVASCULAR STENT CERVICAL CAROTID ARTERY     03-2009; using a drug-eluting platform of the circumflex cornoray artery with a 3.0 x 18 [employer] Promus drug-eluting platform post dilated to 3.75 with a noncompliant balloon.  . TEE WITHOUT CARDIOVERSION  06/20/2011   Procedure: TRANSESOPHAGEAL ECHOCARDIOGRAM (TEE);  Surgeon: Larey Dresser, MD;  Location: [facility] ENDOSCOPY;  Service: Cardiovascular;  Laterality: N/A;     A IV Location/Drains/Wounds Patient Lines/Drains/Airways Status   Active Line/Drains/Airways    Name:   Placement date:   Placement time:   Site:   Days:   Peripheral IV 12/09/18 Right Antecubital   12/09/18    0040    Antecubital   less than 1          Intake/Output Last 24 hours  Intake/Output Summary (Last 24 hours) at 12/09/2018 1021 Last data filed at 12/09/2018 0400 Gross per 24 hour  Intake -  Output 550 ml  Net -550 ml    Labs/Imaging Results for orders placed or performed during the hospital encounter of 12/08/18 (from the past 48 hour(s))  Basic metabolic panel     Status: Abnormal   Collection Time: 12/08/18  3:18 PM  Result Value Ref Range   Sodium 139 135 - 145 mmol/L   Potassium 3.5 3.5 - 5.1 mmol/L   Chloride 99 98 - 111 mmol/L   CO2 28 22 - 32 mmol/L   Glucose, Bld 143 (H) 70 - 99 mg/dL   BUN 14 8 - 23 mg/dL   Creatinine, Ser 1.21 0.61 - 1.24 mg/dL   Calcium 9.2 8.9 - 10.3 mg/dL   GFR calc non Af Amer 58 (L) >60 mL/min   GFR calc Af Amer >60 >60 mL/min   Anion gap 12 5 - 15    Comment: Performed at [facility], [place] [address] [place], [place] [address]  CBC     Status: Abnormal   Collection Time: 12/08/18  3:18 PM  Result Value Ref Range   WBC 5.7 4.0 - 10.5 K/uL   RBC 4.55 4.22 - 5.81  MIL/uL   Hemoglobin 12.7 (L) 13.0 - 17.0 g/dL   HCT 41.3 39.0 - 52.0 %   MCV 90.8 80.0 - 100.0 fL   MCH 27.9 26.0 - 34.0 pg   MCHC 30.8 30.0 - 36.0 g/dL   RDW 14.1 11.5 - 15.5 %   Platelets 159 150 - 400 K/uL   nRBC 0.0 0.0 - 0.2 %    Comment: Performed at [facility], [place] [address], [place], [place] [address]  Troponin I (High Sensitivity)     Status: Abnormal   Collection Time: 12/08/18  3:18 PM  Result Value Ref Range   Troponin I (High Sensitivity) 24 (H) <18 ng/L  Comment: (NOTE) Elevated high sensitivity troponin I (hsTnI) values and significant  changes across serial measurements may suggest ACS but many other  chronic and acute conditions are known to elevate hsTnI results.  Refer to the "Links" section for chest pain algorithms and additional  guidance. Performed at [facility], [place] [address] [place], [place] [address]   Protime-INR (order if Patient is taking Coumadin / Warfarin)     Status: Abnormal   Collection Time: 12/08/18  3:21 PM  Result Value Ref Range   Prothrombin Time 22.7 (H) 11.4 - 15.2 seconds   INR 2.0 (H) 0.8 - 1.2    Comment: (NOTE) INR goal varies based on device and disease states. Performed at [facility], [place] [address], [place], [place] [address]   Troponin I (High Sensitivity)     Status: Abnormal   Collection Time: 12/08/18  8:29 PM  Result Value Ref Range   Troponin I (High Sensitivity) 30 (H) <18 ng/L    Comment: (NOTE) Elevated high sensitivity troponin I (hsTnI) values and significant  changes across serial measurements may suggest ACS but many other  chronic and acute conditions are known to elevate hsTnI results.  Refer to the "Links" section for chest pain algorithms and additional  guidance. Performed at [facility], [place] [address] [place], [place] [address]   Brain natriuretic peptide     Status: Abnormal   Collection Time: 12/09/18 12:23 AM  Result Value Ref Range   B Natriuretic Peptide  352.9 (H) 0.0 - 100.0 pg/mL    Comment: Performed at [place] [address] [place], [place] [address]  Troponin I (High Sensitivity)     Status: Abnormal   Collection Time: 12/09/18 12:24 AM  Result Value Ref Range   Troponin I (High Sensitivity) 29 (H) <18 ng/L    Comment: (NOTE) Elevated high sensitivity troponin I (hsTnI) values and significant  changes across serial measurements may suggest ACS but many other  chronic and acute conditions are known to elevate hsTnI results.  Refer to the "Links" section for chest pain algorithms and additional  guidance. Performed at [facility], [place] [address], [place], [place] [address]   SARS CORONAVIRUS 2 (TAT 6-24 HRS) Nasopharyngeal Nasopharyngeal Swab     Status: None   Collection Time: 12/09/18  2:27 AM   Specimen: Nasopharyngeal Swab  Result Value Ref Range   SARS Coronavirus 2 NEGATIVE NEGATIVE    Comment: (NOTE) SARS-CoV-2 target nucleic acids are NOT DETECTED. The SARS-CoV-2 RNA is generally detectable in upper and lower respiratory specimens during the acute phase of infection. Negative results do not preclude SARS-CoV-2 infection, do not rule out co-infections with other pathogens, and should not be used as the sole basis for treatment or other patient management decisions. Negative results must be combined with clinical observations, patient history, and epidemiological information. The expected result is Negative. Fact Sheet for Patients: SugarRoll.be Fact Sheet for Healthcare Providers: https://www.woods-mathews.com/ This test is not yet approved or cleared by the [place] FDA and  has been authorized for detection and/or diagnosis of SARS-CoV-2 by FDA under an Emergency Use Authorization (EUA). This EUA will remain  in effect (meaning this test can be used) for the duration of the COVID-19 declaration under Section 56 4(b)(1) of the Act, 21 U.S.C. section  360bbb-3(b)(1), unless the authorization is terminated or revoked sooner. Performed at [facility], [place] [address] [place], [place] [address]  Basic metabolic panel     Status: Abnormal   Collection Time: 12/09/18  5:30 AM  Result Value Ref Range   Sodium 139 135 - 145 mmol/L   Potassium 3.2 (L) 3.5 - 5.1 mmol/L   Chloride 95 (L) 98 - 111 mmol/L   CO2 32 22 - 32 mmol/L   Glucose, Bld 103 (H) 70 - 99 mg/dL   BUN 13 8 - 23 mg/dL   Creatinine, Ser 1.13 0.61 - 1.24 mg/dL   Calcium 8.9 8.9 - 10.3 mg/dL   GFR calc non Af Amer >60 >60 mL/min   GFR calc Af Amer >60 >60 mL/min   Anion gap 12 5 - 15    Comment: Performed at [facility], [place] [address], [place], [place] [address]  Lipid panel     Status: None   Collection Time: 12/09/18  5:30 AM  Result Value Ref Range   Cholesterol 103 0 - 200 mg/dL   Triglycerides 54 <150 mg/dL   HDL 43 >40 mg/dL   Total CHOL/HDL Ratio 2.4 RATIO   VLDL 11 0 - 40 mg/dL   LDL Cholesterol 49 0 - 99 mg/dL    Comment:        Total Cholesterol/HDL:CHD Risk Coronary Heart Disease Risk Table                     Men   Women  1/2 Average Risk   3.4   3.3  Average Risk       5.0   4.4  2 X Average Risk   9.6   7.1  3 X Average Risk  23.4   11.0        Use the calculated Patient Ratio above and the CHD Risk Table to determine the patient's CHD Risk.        ATP III CLASSIFICATION (LDL):  <100     mg/dL   Optimal  100-129  mg/dL   Near or Above                    Optimal  130-159  mg/dL   Borderline  160-189  mg/dL   High  >190     mg/dL   Very High Performed at [place] [facility] [address], [place], [place] [address]   CBC     Status: Abnormal   Collection Time: 12/09/18  5:30 AM  Result Value Ref Range   WBC 6.7 4.0 - 10.5 K/uL   RBC 4.64 4.22 - 5.81 MIL/uL   Hemoglobin 12.6 (L) 13.0 - 17.0 g/dL   HCT 41.6 39.0 - 52.0 %   MCV 89.7 80.0 - 100.0 fL   MCH 27.2 26.0 - 34.0 pg   MCHC 30.3 30.0 - 36.0 g/dL   RDW 14.2 11.5 -  15.5 %   Platelets 169 150 - 400 K/uL   nRBC 0.0 0.0 - 0.2 %    Comment: Performed at [facility], [place] [address], [place], [place] [address]  Protime-INR     Status: Abnormal   Collection Time: 12/09/18  5:30 AM  Result Value Ref Range   Prothrombin Time 21.8 (H) 11.4 - 15.2 seconds   INR 1.9 (H) 0.8 - 1.2    Comment: (NOTE) INR goal varies based on device and disease states. Performed at [facility], [place] [address] [place], [place] [address]    *Note: Due to a large number of results and/or encounters  for the requested time period, some results have not been displayed. A complete set of results can be found in Results Review.   Dg Chest 2 View  Result Date: 12/08/2018 CLINICAL DATA:  Reason for exam: cp/sob/edema Patient reports came to hospital last week for similar symptoms. Reports sob, left chest pains, left lower leg swelling. EXAM: CHEST - 2 VIEW COMPARISON:  Chest radiograph 03/28/2018, 02/17/2018 FINDINGS: Stable cardiomediastinal contours status post median sternotomy. The heart size is enlarged. Chronic elevation of the right hemidiaphragm. There is a band like linear opacity at the right lung base likely representing atelectasis. The left lung is clear. No pneumothorax or pleural effusion. No acute finding in the visualized skeleton. IMPRESSION: Bandlike opacity at the right lung base likely representing atelectasis. No focal infiltrate or evidence of edema. Electronically Signed   By: Audie Pinto M.D.   On: 12/08/2018 15:40    Pending Labs Unresulted Labs (From admission, onward)    Start     Ordered   12/10/18 0500  Heparin level (unfractionated)  Daily,   R     12/09/18 0823   12/10/18 0500  CBC  Daily,   R     12/09/18 0823   12/09/18 1600  Heparin level (unfractionated)  Once-Timed,   STAT     12/09/18 0659          Vitals/Pain Today's Vitals   12/09/18 0815 12/09/18 0830 12/09/18 0900 12/09/18 0930  BP:  134/84 (!) 124/93 (!) 119/93   Pulse: (!) 39 (!) 31 (!) 52 81  Resp: 16 14 16 13   Temp:      TempSrc:      SpO2: 99% 99% 97% 99%  PainSc:        Isolation Precautions No active isolations  Medications Medications  acetaminophen (TYLENOL) tablet 650 mg (has no administration in time range)  ondansetron (ZOFRAN) injection 4 mg (has no administration in time range)  atorvastatin (LIPITOR) tablet 40 mg (has no administration in time range)  diltiazem (CARDIZEM CD) 24 hr capsule 180 mg (has no administration in time range)  amiodarone (NEXTERONE) 1.8 mg/mL load via infusion 150 mg (150 mg Intravenous Bolus from Bag 12/09/18 0529)    Followed by  amiodarone (NEXTERONE PREMIX) 360-4.14 MG/200ML-% (1.8 mg/mL) IV infusion (60 mg/hr Intravenous New Bag/Given 12/09/18 0527)    Followed by  amiodarone (NEXTERONE PREMIX) 360-4.14 MG/200ML-% (1.8 mg/mL) IV infusion (has no administration in time range)  heparin ADULT infusion 100 units/mL (25000 units/28mL sodium chloride 0.45%) (1,500 Units/hr Intravenous New Bag/Given 12/09/18 0727)  sodium chloride flush (NS) 0.9 % injection 3 mL (3 mLs Intravenous Given 12/09/18 0041)  furosemide (LASIX) injection 40 mg (40 mg Intravenous Given 12/09/18 0221)    Mobility walks with device Low fall risk   Focused Assessments Cardiac Assessment Handoff:  Cardiac Rhythm: Atrial fibrillation Lab Results  Component Value Date   CKTOTAL 228 04/27/2017   CKMB 0 04/09/2017   TROPONINI 0.03 (HH) 02/18/2018   Lab Results  Component Value Date   DDIMER 0.42 04/27/2017   Does the Patient currently have chest pain? No     R Recommendations: See Admitting Provider Note  Report given to:   Additional Notes: .

## 2018-12-10 ENCOUNTER — Inpatient Hospital Stay (HOSPITAL_COMMUNITY): Payer: Medicare Other | Admitting: Certified Registered Nurse Anesthetist

## 2018-12-10 ENCOUNTER — Encounter (HOSPITAL_COMMUNITY): Payer: Self-pay | Admitting: Certified Registered Nurse Anesthetist

## 2018-12-10 ENCOUNTER — Encounter (HOSPITAL_COMMUNITY)
Admission: EM | Disposition: A | Discharge status: Rehabilitation Facility | Payer: Self-pay | Source: Home / Self Care | Attending: Internal Medicine

## 2018-12-10 DIAGNOSIS — I5033 Acute on chronic diastolic (congestive) heart failure: Secondary | ICD-10-CM

## 2018-12-10 DIAGNOSIS — I5032 Chronic diastolic (congestive) heart failure: Secondary | ICD-10-CM

## 2018-12-10 DIAGNOSIS — R7989 Other specified abnormal findings of blood chemistry: Secondary | ICD-10-CM

## 2018-12-10 DIAGNOSIS — Z7901 Long term (current) use of anticoagulants: Secondary | ICD-10-CM

## 2018-12-10 DIAGNOSIS — I4819 Other persistent atrial fibrillation: Principal | ICD-10-CM

## 2018-12-10 DIAGNOSIS — R778 Other specified abnormalities of plasma proteins: Secondary | ICD-10-CM

## 2018-12-10 HISTORY — PX: CARDIOVERSION: SHX1299

## 2018-12-10 LAB — BASIC METABOLIC PANEL
Anion gap: 11 (ref 5–15)
BUN: 15 mg/dL (ref 8–23)
CO2: 29 mmol/L (ref 22–32)
Calcium: 8.9 mg/dL (ref 8.9–10.3)
Chloride: 99 mmol/L (ref 98–111)
Creatinine, Ser: 0.93 mg/dL (ref 0.61–1.24)
GFR calc Af Amer: >60 mL/min (ref >60)
GFR calc non Af Amer: >60 mL/min (ref >60)
Glucose, Bld: 131 mg/dL — ABNORMAL HIGH (ref 70–99)
Potassium: 3.5 mmol/L (ref 3.5–5.1)
Sodium: 139 mmol/L (ref 135–145)

## 2018-12-10 LAB — CBC
HCT: 40.6 % (ref 39.0–52.0)
Hemoglobin: 12.4 g/dL — ABNORMAL LOW (ref 13.0–17.0)
MCH: 27.3 pg (ref 26.0–34.0)
MCHC: 30.5 g/dL (ref 30.0–36.0)
MCV: 89.2 fL (ref 80.0–100.0)
Platelets: 151 10*3/uL (ref 150–400)
RBC: 4.55 MIL/uL (ref 4.22–5.81)
RDW: 14.3 % (ref 11.5–15.5)
WBC: 5.9 10*3/uL (ref 4.0–10.5)
nRBC: 0 % (ref 0.0–0.2)

## 2018-12-10 LAB — PROTIME-INR
INR: 1.9 — ABNORMAL HIGH (ref 0.8–1.2)
Prothrombin Time: 21.3 seconds — ABNORMAL HIGH (ref 11.4–15.2)

## 2018-12-10 LAB — HEPARIN LEVEL (UNFRACTIONATED)
Heparin Unfractionated: 0.92 IU/mL — ABNORMAL HIGH (ref 0.30–0.70)
Heparin Unfractionated: 1.1 IU/mL — ABNORMAL HIGH (ref 0.30–0.70)

## 2018-12-10 SURGERY — CARDIOVERSION
Anesthesia: General

## 2018-12-10 MED ORDER — LIDOCAINE 2% (20 MG/ML) 5 ML SYRINGE
INTRAMUSCULAR | Status: DC | PRN
  Administered 2018-12-10: 60 mg via INTRAVENOUS

## 2018-12-10 MED ORDER — ORAL CARE MOUTH RINSE
15.0000 mL | Freq: Two times a day (BID) | OROMUCOSAL | Status: DC
  Administered 2018-12-10 – 2018-12-16 (×10): 15 mL via OROMUCOSAL

## 2018-12-10 MED ORDER — WARFARIN SODIUM 2 MG PO TABS
2.0000 mg | ORAL_TABLET | Freq: Once | ORAL | Status: AC
  Administered 2018-12-10: 2 mg via ORAL
  Filled 2018-12-10: qty 1

## 2018-12-10 MED ORDER — WARFARIN - PHARMACIST DOSING INPATIENT
Freq: Every day | Status: DC
  Administered 2018-12-10: 17:00:00

## 2018-12-10 MED ORDER — SODIUM CHLORIDE 0.9 % IV SOLN: 250.0000 mL | INTRAVENOUS | Status: DC

## 2018-12-10 MED ORDER — SODIUM CHLORIDE 0.9 % IV SOLN
INTRAVENOUS | Status: DC
  Administered 2018-12-10: 10:00:00 via INTRAVENOUS

## 2018-12-10 MED ORDER — FUROSEMIDE 40 MG PO TABS
40.0000 mg | ORAL_TABLET | Freq: Every day | ORAL | Status: DC
  Administered 2018-12-10 – 2018-12-11 (×2): 40 mg via ORAL
  Filled 2018-12-10 (×3): qty 1

## 2018-12-10 MED ORDER — SODIUM CHLORIDE 0.9% FLUSH
3.0000 mL | INTRAVENOUS | Status: DC | PRN
  Administered 2018-12-11: 3 mL via INTRAVENOUS
  Filled 2018-12-10: qty 3

## 2018-12-10 MED ORDER — HEPARIN (PORCINE) 25000 UT/250ML-% IV SOLN
1100.0000 [IU]/h | INTRAVENOUS | Status: DC
  Administered 2018-12-10: 900 [IU]/h via INTRAVENOUS
  Filled 2018-12-10 (×2): qty 250

## 2018-12-10 MED ORDER — LEVOTHYROXINE SODIUM 112 MCG PO TABS
112.0000 ug | ORAL_TABLET | Freq: Every day | ORAL | Status: DC
  Administered 2018-12-11 – 2018-12-16 (×6): 112 ug via ORAL
  Filled 2018-12-10 (×6): qty 1

## 2018-12-10 MED ORDER — AMIODARONE HCL 200 MG PO TABS
400.0000 mg | ORAL_TABLET | Freq: Every day | ORAL | Status: DC
  Administered 2018-12-10 – 2018-12-11 (×2): 400 mg via ORAL
  Filled 2018-12-10 (×2): qty 2

## 2018-12-10 MED ORDER — SODIUM CHLORIDE 0.9% FLUSH
3.0000 mL | Freq: Two times a day (BID) | INTRAVENOUS | Status: DC
  Administered 2018-12-10 – 2018-12-16 (×11): 3 mL via INTRAVENOUS

## 2018-12-10 MED ORDER — PROPOFOL 10 MG/ML IV BOLUS
INTRAVENOUS | Status: DC | PRN
  Administered 2018-12-10: 140 mg via INTRAVENOUS

## 2018-12-10 MED ORDER — WARFARIN SODIUM 2.5 MG PO TABS: 2.5000 mg | ORAL_TABLET | Freq: Once | ORAL | Status: DC

## 2018-12-10 NOTE — Anesthesia Preprocedure Evaluation (Addendum)
Anesthesia Evaluation  Patient identified by MRN, date of birth, ID band Patient awake    Reviewed: Allergy & Precautions, NPO status , Patient's Chart, lab work & pertinent test results  Airway Mallampati: III       Dental no notable dental hx.    Pulmonary sleep apnea ,    Pulmonary exam normal        Cardiovascular hypertension, + angina + CAD and +CHF  + dysrhythmias Atrial Fibrillation  Rhythm:Irregular Rate:Tachycardia     Neuro/Psych CVA (Left-sided weakness), Residual Symptoms    GI/Hepatic Neg liver ROS, GERD  Medicated and Controlled,  Endo/Other  diabetes, Oral Hypoglycemic AgentsHypothyroidism   Renal/GU negative Renal ROS     Musculoskeletal Gout Chronic pain   Abdominal (+) + obese,   Peds  Hematology HLD   Anesthesia Other Findings A-fib  Reproductive/Obstetrics                         Anesthesia Physical Anesthesia Plan  ASA: IV  Anesthesia Plan: General   Post-op Pain Management:    Induction: Intravenous  PONV Risk Score and Plan: 2 and Treatment may vary due to age or medical condition and Propofol infusion  Airway Management Planned: Mask  Additional Equipment:   Intra-op Plan:   Post-operative Plan:   Informed Consent: I have reviewed the patients History and Physical, chart, labs and discussed the procedure including the risks, benefits and alternatives for the proposed anesthesia with the patient or authorized representative who has indicated his/her understanding and acceptance.     Dental advisory given  Plan Discussed with: CRNA  Anesthesia Plan Comments:        Anesthesia Quick Evaluation

## 2018-12-10 NOTE — Anesthesia Postprocedure Evaluation (Signed)
Anesthesia Post Note  Patient: [name]  Procedure(s) Performed: CARDIOVERSION (N/A )     Patient location during evaluation: Endoscopy Anesthesia Type: General Level of consciousness: awake and alert Pain management: pain level controlled Vital Signs Assessment: post-procedure vital signs reviewed and stable Respiratory status: spontaneous breathing, nonlabored ventilation, respiratory function stable and patient connected to nasal cannula oxygen Cardiovascular status: blood pressure returned to baseline and stable Postop Assessment: no apparent nausea or vomiting Anesthetic complications: no    Last Vitals:  Vitals:   12/10/18 1035 12/10/18 1105  BP: (!) 142/92 (!) 139/98  Pulse: 81 86  Resp: 20 20  Temp:    SpO2: 99% 98%    Last Pain:  Vitals:   12/10/18 1035  TempSrc:   PainSc: 0-No pain                 Suzanne Garbers P Ekansh Sherk

## 2018-12-10 NOTE — Op Note (Signed)
Procedure: Electrical Cardioversion Indications:  Atrial Fibrillation  Procedure Details:  Consent: Risks of procedure as well as the alternatives and risks of each were explained to the (patient/caregiver).  Consent for procedure obtained.  Time Out: Verified patient identification, verified procedure, site/side was marked, verified correct patient position, special equipment/implants available, medications/allergies/relevent history reviewed, required imaging and test results available.  Performed  Patient placed on cardiac monitor, pulse oximetry, supplemental oxygen as necessary.  Sedation given: Propofol 140 mg IV, Dr. Roanna Banning Pacer pads placed anterior and posterior chest.  Cardioverted 1 time(s).  Cardioversion with synchronized biphasic 150J shock.  Evaluation: Findings: Post procedure EKG shows: NSR Complications: None Patient did tolerate procedure well.  Time Spent Directly with the Patient:  30 minutes   [name] 12/10/2018, 9:55 AM

## 2018-12-10 NOTE — Transfer of Care (Signed)
Immediate Anesthesia Transfer of Care Note  Patient: [name]  Procedure(s) Performed: CARDIOVERSION (N/A )  Patient Location: PACU and Endoscopy Unit  Anesthesia Type:General  Level of Consciousness: drowsy and patient cooperative  Airway & Oxygen Therapy: Patient Spontanous Breathing and Patient connected to nasal cannula oxygen  Post-op Assessment: Report given to RN and Post -op Vital signs reviewed and stable  Post vital signs: Reviewed and stable  Last Vitals:  Vitals Value Taken Time  BP    Temp    Pulse    Resp    SpO2      Last Pain:  Vitals:   12/10/18 0923  TempSrc: Oral  PainSc: 0-No pain         Complications: No apparent anesthesia complications

## 2018-12-10 NOTE — Interval H&P Note (Signed)
History and Physical Interval Note:  12/10/2018 8:52 AM  [name]  has presented today for surgery, with the diagnosis of afib.  The various methods of treatment have been discussed with the patient and family. After consideration of risks, benefits and other options for treatment, the patient has consented to  Procedure(s): CARDIOVERSION (N/A) as a surgical intervention.  The patient's history has been reviewed, patient examined, no change in status, stable for surgery.  I have reviewed the patient's chart and labs.  Questions were answered to the patient's satisfaction.     [name]

## 2018-12-10 NOTE — Progress Notes (Signed)
[place] for Heparin when INR is <2 Indication: atrial fibrillation  No Known Allergies  Vital Signs: Temp: 97.9 F (36.6 C) (09/25 0425) Temp Source: Oral (09/25 0425) BP: 104/82 (09/25 0425) Pulse Rate: 89 (09/25 0425)  Labs: Recent Labs    12/08/18 1518 12/08/18 1521 12/08/18 2029 12/09/18 0024 12/09/18 0530 12/09/18 1549 12/10/18 0401  HGB 12.7*  --   --   --  12.6*  --  12.4*  HCT 41.3  --   --   --  41.6  --  40.6  PLT 159  --   --   --  169  --  151  LABPROT  --  22.7*  --   --  21.8*  --   --   INR  --  2.0*  --   --  1.9*  --   --   HEPARINUNFRC  --   --   --   --   --  0.56 0.92*  CREATININE 1.21  --   --   --  1.13  --  0.93  TROPONINIHS 24*  --  30* 29*  --   --   --     Estimated Creatinine Clearance: 91.6 mL/min (by C-G formula based on SCr of 0.93 mg/dL).   Medical History: Past Medical History:  Diagnosis Date  . Acute encephalopathy 12/12/2015  . Acute respiratory failure with hypoxia and hypercapnia ([place]) 12/12/2015  . Allergic rhinitis 02/13/2010   Qualifier: Diagnosis of  By: Wynona Luna   . Angina at rest [facility]) 10/22/2015  . Ataxia 08/10/2013  . Atrial fibrillation ([place]) 11/2018  . B12 deficiency 08/11/2013  . BACK PAIN, LUMBAR 01/29/2009   Qualifier: Diagnosis of  By: Wynona Luna   . BENIGN POSITIONAL VERTIGO 11/23/2009   Qualifier: Diagnosis of  By: Wynona Luna   . Chronic diastolic heart failure ([place]) 04/24/2015  . Chronic pain    left sided-Kristeins  . Coronary 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 ([place]) 11/04/2015   s/p CABG 2004; CFX DES 2011  . Essential hypertension 12/12/2006   Qualifier: Diagnosis of  By: [name] RMA, Lucy    . GERD 12/12/2006   Qualifier: Diagnosis of  By: [name] RMA, Lucy    . GOUT 12/12/2006   Qualifier: Diagnosis of  By: Reatha Armour, Lucy    . Hyperlipidemia LDL goal <70 12/12/2006   Qualifier: Diagnosis of  By: [name] RMA, Lucy     .  Hypogonadism male 04/10/2011  . Hypothyroidism, acquired 02/15/2013  . Insomnia 05/21/2013  . Long term current use of anticoagulant therapy 06/24/2011  . Obstructive sleep apnea-Failed CPAP 06/17/2007   Failed CPAP Using O 2 for sleep   . Paroxysmal atrial fibrillation ([place]) 12/06/2016  . 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 Thoracic aorta atherosclerosis ([place]) 04/27/2017  . Tubular adenoma of colon 05/2010  . Type 2 diabetes mellitus without complication, without long-term current use of insulin ([place]) 10/05/2007   Qualifier: Diagnosis of  By: Wynona Luna   . Vasovagal syncope     Assessment: 73 y/o M with exertional shortness of breath, on warfarin PTA for afib, consulted to start heparin when INR is <2, INR earlier yesterday was 2  9/24 AM update: INR is 1.9 this AM, starting heparin  9/25 AM update: Heparin level this AM is elevated, CBC stable  Goal of  Therapy:  INR 2-3 Monitor platelets by anticoagulation protocol: Yes   Plan:  Dec heparin to 1250 units/hr 1400 heparin level  Narda Bonds, PharmD, BCPS Clinical Pharmacist Phone: [phone]

## 2018-12-10 NOTE — Plan of Care (Signed)
  Problem: Education: Goal: Knowledge of disease or condition will improve Outcome: Progressing Goal: Understanding of medication regimen will improve Outcome: Progressing   Problem: Activity: Goal: Ability to tolerate increased activity will improve Outcome: Progressing   Problem: Education: Goal: Knowledge of General Education information will improve Description: Including pain rating scale, medication(s)/side effects and non-pharmacologic comfort measures Outcome: Progressing

## 2018-12-10 NOTE — Progress Notes (Addendum)
ANTICOAGULATION CONSULT NOTE - Follow Up Consult  Pharmacy Consult for Heparin when INR is <2 Indication: atrial fibrillatio  No Known Allergies  Patient Measurements: Height: _0  (190.5 cm) Weight: 240 lb 15.4 oz (109.3 kg) IBW/kg (Calculated) : 84.5 Heparin Dosing Weight: 106.7 kg   Vital Signs: Temp: 98.6 F (37 C) (09/25 0959) Temp Source: Oral (09/25 0959) BP: 139/98 (09/25 1105) Pulse Rate: 86 (09/25 1105)  Labs: Recent Labs    12/08/18 1518 12/08/18 1521 12/08/18 2029 12/09/18 0024 12/09/18 0530 12/09/18 1549 12/10/18 0401 12/10/18 1335  HGB 12.7*  --   --   --  12.6*  --  12.4*  --   HCT 41.3  --   --   --  41.6  --  40.6  --   PLT 159  --   --   --  169  --  151  --   LABPROT  --  22.7*  --   --  21.8*  --   --  21.3*  INR  --  2.0*  --   --  1.9*  --   --  1.9*  HEPARINUNFRC  --   --   --   --   --  0.56 0.92* 1.10*  CREATININE 1.21  --   --   --  1.13  --  0.93  --   TROPONINIHS 24*  --  30* 29*  --   --   --   --     Estimated Creatinine Clearance: 91.6 mL/min (by C-G formula based on SCr of 0.93 mg/dL).   Medications:  Medications Prior to Admission  Medication Sig Dispense Refill Last Dose  . allopurinol (ZYLOPRIM) 100 MG tablet TAKE 2 TABLETS BY MOUTH EVERY DAY (Patient taking differently: Take 200 mg by mouth daily. ) 180 tablet 1 12/08/2018 at Unknown time  . atorvastatin (LIPITOR) 40 MG tablet Take 1 tablet (40 mg total) by mouth daily. 90 tablet 3 12/08/2018 at Unknown time  . benazepril (LOTENSIN) 5 MG tablet Take 1 tablet (5 mg total) by mouth daily.   12/08/2018 at Unknown time  . diltiazem (CARDIZEM CD) 180 MG 24 hr capsule Take 1 capsule (180 mg total) by mouth daily. 30 capsule 1 12/08/2018 at Unknown time  . fluticasone (FLONASE) 50 MCG/ACT nasal spray Place 2 sprays into both nostrils daily. (Patient taking differently: Place 2 sprays into both nostrils daily as needed for allergies. ) 9.9 g 1 unk  . furosemide (LASIX) 40 MG tablet TAKE 1  TABLET BY MOUTH EVERY OTHER DAY (Patient taking differently: Take 40 mg by mouth every other day. ) 45 tablet 1 12/07/2018  . isosorbide mononitrate (IMDUR) 30 MG 24 hr tablet TAKE 1/2 TABLET BY MOUTH EVERY DAY (Patient taking differently: Take 15 mg by mouth daily. ) 45 tablet 3 12/08/2018 at Unknown time  . levothyroxine (SYNTHROID, LEVOTHROID) 112 MCG tablet TAKE 1 TABLET (112 MCG TOTAL) BY MOUTH DAILY BEFORE BREAKFAST. 90 tablet 1 12/08/2018 at Unknown time  . metFORMIN (GLUCOPHAGE) 500 MG tablet TAKE 1 TABLET (500 MG TOTAL) DAILY WITH BREAKFAST BY MOUTH. 90 tablet 1 12/08/2018 at Unknown time  . Multiple Vitamins-Minerals (MULTIVITAMIN GUMMIES MENS PO) Take 2 tablets by mouth daily with breakfast.    12/08/2018 at Unknown time  . nitroGLYCERIN (NITROSTAT) 0.4 MG SL tablet Place 1 tablet (0.4 mg total) under the tongue every 5 (five) minutes as needed for chest pain. Up to 3 doses 10 tablet 2 unk  . OXYGEN  Inhale 2 L into the lungs at bedtime.   Past Week at Unknown time  . pantoprazole (PROTONIX) 40 MG tablet TAKE 1 TABLET (40 MG TOTAL) EVERY MORNING BY MOUTH. 90 tablet 1 12/08/2018 at Unknown time  . senna-docusate (SENOKOT-S) 8.6-50 MG tablet Take 2 tablets by mouth 2 (two) times daily.   12/08/2018 at Unknown time  . traMADol (ULTRAM) 50 MG tablet TAKE 1 TABLET BY MOUTH TWICE A DAY (Patient taking differently: Take 50 mg by mouth 2 (two) times daily. ) 180 tablet 1 12/08/2018 at Unknown time  . traMADol (ULTRAM-ER) 200 MG 24 hr tablet TAKE 1 TABLET BY MOUTH EVERY DAY (Patient taking differently: Take 200 mg by mouth every morning. ) 90 tablet 1 12/08/2018 at Unknown time  . vitamin B-12 1000 MCG tablet Take 1 tablet (1,000 mcg total) by mouth daily. (Patient taking differently: Take 2,000 mcg by mouth daily. ) 30 tablet 0 12/08/2018 at Unknown time  . warfarin (COUMADIN) 2.5 MG tablet Take 0.5-1 tablets (1.25-2.5 mg total) by mouth See admin instructions. Take 2.4m by mouth on MON WED FRI SAT, then take  1.[place] and resume 9/16.   12/08/2018 at 0200  . ACCU-CHEK GUIDE test strip USE UP TO FOUR TIMES DAILY AS DIRECTED 100 strip 0   . blood glucose meter kit and supplies KIT Dispense based on patient and insurance preference. Use up to four times daily as directed. (FOR ICD-9 250.00, 250.01). 1 each 0   . fluocinonide gel (LIDEX) 0.05 % Apply to scalp once daily as needed (Patient not taking: Reported on 12/09/2018) 60 g 0 Not Taking at Unknown time    Assessment: [age]/o M with exertional shortness of breath, on warfarin PTA for afib, consulted to start heparin when INR is <2.  Heparin drip started on 9/24.  Heparin level supratherapeutic this AM, thus Heparin rate decreased to 1250 units/hr.  Next HL is 1.1, remains supratherapeutic despite reduced heparin rate. RN reports level drawn appropriately from site opposite of where heparin infusing.  No bleeding noted.  INR 1.9, Pharmacy consulted to restart Warfarin today CBC stable  PTA warfain dose : 2.5 mg MWFSat , 1.25 mg TTSun, LD taken 9/23 at 0200 DCCV done 9/25, Post procedure EKG shows: NSR   Goal of Therapy:  INR 2-3 Heparin level 0.3-0.7 units/ml Monitor platelets by anticoagulation protocol: Yes   Plan:  Hold Heparin drip x 1 hour then resume at decreased rate to  units/hr Check 6 hour heparin level Restart Warfarin , give 2 mg today x1 , watch for DDI with new Amiodarone. Daily INR , HL, CBC  RuNicole CellaRPNorth Carolinalinical Pharmacist [phone] check AMION for all [facility] numbers After 10:00 PM, call MaBellerose [phone]/25/2020,3:04 PM

## 2018-12-10 NOTE — Progress Notes (Signed)
Progress Note  Patient Name: [name] Date of Encounter: 12/10/2018  Primary Cardiologist: Candee Furbish, MD  Subjective   Reports feeling better after DCCV. No chest pain. Mild SOB.   Inpatient Medications    Scheduled Meds: . atorvastatin  40 mg Oral Daily  . diltiazem  180 mg Oral Daily  . mouth rinse  15 mL Mouth Rinse BID   Continuous Infusions: . amiodarone 30 mg/hr (12/10/18 0646)  . heparin 1,250 Units/hr (12/10/18 [id number])   PRN Meds: acetaminophen, ondansetron (ZOFRAN) IV   Vital Signs    Vitals:   12/09/18 1101 12/09/18 1253 12/09/18 2044 12/10/18 0425  BP: 131/86 (!) 151/112 121/89 104/82  Pulse:  98 97 89  Resp:  (!) 22 18 18   Temp:  97.6 F (36.4 C) 97.8 F (36.6 C) 97.9 F (36.6 C)  TempSrc:  Oral Oral Oral  SpO2:  99% 99% 97%  Weight:  108.2 kg  109.3 kg  Height:  6\' 3"  (1.905 m)      Intake/Output Summary (Last 24 hours) at 12/10/2018 0901 Last data filed at 12/10/2018 0651 Gross per 24 hour  Intake 1617.32 ml  Output 650 ml  Net 967.32 ml   Filed Weights   12/09/18 1253 12/10/18 0425  Weight: 108.2 kg 109.3 kg    Physical Exam   General: Elderly, NAD Neck: Negative for carotid bruits. No JVD Lungs:Clear to ausculation bilaterally. No wheezes, rales, or rhonchi. Breathing is unlabored. Cardiovascular: RRR with S1 S2. No murmur Abdomen: Soft, non-tender, non-distended. No obvious abdominal masses. Extremities: L>R BLE 1-2+ edema. No clubbing or cyanosis. DP pulses 2+ bilaterally Neuro: Alert and oriented. No focal deficits. No facial asymmetry. MAE spontaneously. Psych: Responds to questions appropriately with normal affect.    Labs    Chemistry Recent Labs  Lab 12/08/18 1518 12/09/18 0530 12/10/18 0401  NA 139 139 139  K 3.5 3.2* 3.5  CL 99 95* 99  CO2 28 32 29  GLUCOSE 143* 103* 131*  BUN 14 13 15   CREATININE 1.21 1.13 0.93  CALCIUM 9.2 8.9 8.9  GFRNONAA 58* >60 >60  GFRAA >60 >60 >60  ANIONGAP 12 12 11      Hematology Recent Labs  Lab 12/08/18 1518 12/09/18 0530 12/10/18 0401  WBC 5.7 6.7 5.9  RBC 4.55 4.64 4.55  HGB 12.7* 12.6* 12.4*  HCT 41.3 41.6 40.6  MCV 90.8 89.7 89.2  MCH 27.9 27.2 27.3  MCHC 30.8 30.3 30.5  RDW 14.1 14.2 14.3  PLT 159 169 151    Cardiac EnzymesNo results for input(s): TROPONINI in the last 168 hours. No results for input(s): TROPIPOC in the last 168 hours.   BNP Recent Labs  Lab 12/09/18 0023  BNP 352.9*     DDimer No results for input(s): DDIMER in the last 168 hours.   Radiology    Dg Chest 2 View  Result Date: 12/08/2018 CLINICAL DATA:  Reason for exam: cp/sob/edema Patient reports came to hospital last week for similar symptoms. Reports sob, left chest pains, left lower leg swelling. EXAM: CHEST - 2 VIEW COMPARISON:  Chest radiograph 03/28/2018, 02/17/2018 FINDINGS: Stable cardiomediastinal contours status post median sternotomy. The heart size is enlarged. Chronic elevation of the right hemidiaphragm. There is a band like linear opacity at the right lung base likely representing atelectasis. The left lung is clear. No pneumothorax or pleural effusion. No acute finding in the visualized skeleton. IMPRESSION: Bandlike opacity at the right lung base likely representing atelectasis. No  focal infiltrate or evidence of edema. Electronically Signed   By: Audie Pinto M.D.   On: 12/08/2018 15:40   Telemetry    12/10/2018 NSR post DCCV HR 80's - Personally Reviewed  ECG    12/10/2018 NSR with PACs HR 81 - Personally Reviewed  Cardiac Studies   CATH 02/19/2018 1. Left dominant circulation 2. Severe 3 vessel occlusive CAD. - 95% ostial LAD - 100% first diagonal - 100% ostial LCx. This is new since 2017 - was 95% prior - 100% nondominant RCA 3. Patent LIMA to the LAD. There is chronic severe disease in the apical LAD 4. Patent SVG to the first diagonal and SVG to the first OM as a Y graft. 5. Chronic occlusion of the SVG to  terminal LCx 6. Normal LVEDP  ECHO 02/18/2018  - Left ventricle: The cavity size was normal. There was mild focal basal hypertrophy of the septum. Systolic function was normal. The estimated ejection fraction was in the range of 55% to 60%. Wall motion was normal; there were no regional wall motion abnormalities. Doppler parameters are consistent with abnormal left ventricular relaxation (grade 1 diastolic dysfunction). Doppler parameters are consistent with elevated ventricular end-diastolic filling pressure. - Mitral valve: Calcified annulus. Mildly thickened leaflets . There was mild regurgitation. - Left atrium: The atrium was mildly dilated. - Right ventricle: The cavity size was mildly dilated. Wall thickness was normal. Systolic function was mildly reduced. - Right atrium: The atrium was normal in size. - Tricuspid valve: There was mild regurgitation. - Pulmonary arteries: Systolic pressure was within the normal range. - Inferior vena cava: The vessel was normal in size. - Pericardium, extracardiac: There was no pericardial effusion.  Patient Profile     73 y.o. male with a history of CAD (s/p CABG in 2004, DES to LCx in 2011), paroxysmal atrial fibrillation (on Coumadin), hypertension, hyperlipidemia, diabetes mellitus, diastolic heart failure, previous CVA with residual left-sided deficit and obstructive sleep apnea (on nighttime oxygen 2 L ) who was admitted on the recommendation of his primary care physician for recurrent atrial fibrillation.   Assessment & Plan    1. Atrial fibrillation with rapid ventricular response: -Failed a cardioversion on his last hospitalization>was discharged home on diltiazem PO QD  -EKG on presentation showed atrial fibrillation with a heart rate in the 110s -Continue diltiazem CD 180 mg daily -Hold Coumadin -Started on IV heparin when INR less than 2.0 -Amiodarone IV bolus and infusion (per Dr. Olin Pia  recommendations)>>remains on Amio gtt and will need transisiton to PO. Will discuss long term plan for antiarrythmic therapy with rounding MD  -DCCV performed 12/10/2018 to NSR with 150J one synchronized shock  -NSR on telemetry post DCCV   2.  Chronic diastolic heart failure -Most recent echocardiogram 02/2018 with normal LVEF at 55-60% with NWMA -Weight, 240lb>>238lb on presentation -I&O, net positive 421ml -Not currently on diuretic therapy  -Will add PO Lasix 40mg  QD>>creatinine stable at 0.93  3.  Coronary artery disease status post CABG: -High-sensitivity troponin mildly elevated (24 and 30)>>>suspicious for ACS -Cardiac catheterization in 02/2018 showed three out of four grafts patent>SVG to the left circumflex was occluded.  -An echocardiogram around the same time revealed a preserved left ventricular systolic function.  -Continue atorvastatin 40 mg daily -No anginal symptoms  -On home Coumadin>>Hep gtt currently    Signed, Kathyrn Drown NP-C [place] Pager: [phone] 12/10/2018, 9:01 AM     For questions or updates, please contact   Please consult [email or web address] for contact info  under Cardiology/STEMI.

## 2018-12-10 NOTE — Anesthesia Procedure Notes (Signed)
Procedure Name: General with mask airway Date/Time: 12/10/2018 9:53 AM Performed by: Lowella Dell, CRNA Pre-anesthesia Checklist: Patient identified, Emergency Drugs available, Suction available, Patient being monitored and Timeout performed Patient Re-evaluated:Patient Re-evaluated prior to induction Oxygen Delivery Method: Ambu bag Preoxygenation: Pre-oxygenation with 100% oxygen Induction Type: IV induction Ventilation: Mask ventilation without difficulty Placement Confirmation: positive ETCO2 Dental Injury: Teeth and Oropharynx as per pre-operative assessment

## 2018-12-11 LAB — HEPARIN LEVEL (UNFRACTIONATED)
Heparin Unfractionated: 0.38 IU/mL (ref 0.30–0.70)
Heparin Unfractionated: 0.35 IU/mL (ref 0.30–0.70)

## 2018-12-11 LAB — CBC
HCT: 36.7 % — ABNORMAL LOW (ref 39.0–52.0)
Hemoglobin: 11.7 g/dL — ABNORMAL LOW (ref 13.0–17.0)
MCH: 28.4 pg (ref 26.0–34.0)
MCHC: 31.9 g/dL (ref 30.0–36.0)
MCV: 89.1 fL (ref 80.0–100.0)
Platelets: 130 10*3/uL — ABNORMAL LOW (ref 150–400)
RBC: 4.12 MIL/uL — ABNORMAL LOW (ref 4.22–5.81)
RDW: 14.4 % (ref 11.5–15.5)
WBC: 7.3 10*3/uL (ref 4.0–10.5)
nRBC: 0 % (ref 0.0–0.2)

## 2018-12-11 LAB — PROTIME-INR
INR: 1.6 — ABNORMAL HIGH (ref 0.8–1.2)
Prothrombin Time: 19.1 seconds — ABNORMAL HIGH (ref 11.4–15.2)

## 2018-12-11 MED ORDER — DILTIAZEM HCL ER COATED BEADS 120 MG PO CP24
120.0000 mg | ORAL_CAPSULE | Freq: Two times a day (BID) | ORAL | Status: DC
  Administered 2018-12-11 – 2018-12-13 (×4): 120 mg via ORAL
  Filled 2018-12-11 (×4): qty 1

## 2018-12-11 MED ORDER — AMIODARONE HCL 200 MG PO TABS
400.0000 mg | ORAL_TABLET | Freq: Two times a day (BID) | ORAL | Status: DC
  Administered 2018-12-11 – 2018-12-13 (×4): 400 mg via ORAL
  Filled 2018-12-11 (×4): qty 2

## 2018-12-11 MED ORDER — WARFARIN SODIUM 2 MG PO TABS
2.0000 mg | ORAL_TABLET | Freq: Once | ORAL | Status: AC
  Administered 2018-12-11: 2 mg via ORAL
  Filled 2018-12-11: qty 1

## 2018-12-11 NOTE — Progress Notes (Signed)
ANTICOAGULATION CONSULT NOTE - Follow Up Consult  Pharmacy Consult for Heparin and Coumadin Indication: atrial fibrillation  No Known Allergies  Patient Measurements: Height: 6\' 3"  (190.5 cm) Weight: 240 lb 15.4 oz (109.3 kg) IBW/kg (Calculated) : 84.5 Heparin Dosing Weight: 106.7 kg  Vital Signs: Temp: 98.3 F (36.8 C) (09/26 0904) Temp Source: Oral (09/26 0904) BP: 136/84 (09/26 0904) Pulse Rate: 87 (09/26 0904)  Labs: Recent Labs    12/08/18 1518  12/08/18 2029 12/09/18 0024 12/09/18 0530  12/10/18 0401 12/10/18 1335 12/10/18 2326 12/11/18 0355  HGB 12.7*  --   --   --  12.6*  --  12.4*  --   --  11.7*  HCT 41.3  --   --   --  41.6  --  40.6  --   --  36.7*  PLT 159  --   --   --  169  --  151  --   --  130*  LABPROT  --    < >  --   --  21.8*  --   --  21.3*  --  19.1*  INR  --    < >  --   --  1.9*  --   --  1.9*  --  1.6*  HEPARINUNFRC  --   --   --   --   --    < > 0.92* 1.10* 0.38 0.35  CREATININE 1.21  --   --   --  1.13  --  0.93  --   --   --   TROPONINIHS 24*  --  30* 29*  --   --   --   --   --   --    < > = values in this interval not displayed.    Estimated Creatinine Clearance: 91.6 mL/min (by C-G formula based on SCr of 0.93 mg/dL).  Assessment:  73 y/o M with exertional shortness of breath, on warfarin PTA for afib (  PTA warfain dose 2.5 mg MWFSat, 1.25 mg TTSun, LD taken 9/23 at 0200), DCCV done 9/25, Post procedure EKG shows:NSR. consulted to start heparin when INR is <2.  Heparin drip started on 9/24.   Warfarin resumed 9/25 pm with 2 mg dose.  New to Amiodarone this admission.  Heparin level this am is therapeutic at 0.35 on 900 units/hr. INR is subtherapeutic at 1.6. H&H is trending down slightly but stable at 11.7/36.7, plts are slightly lower at 130 today.    Goal of Therapy:  INR 2-3 Heparin level 0.3-0.7 units/ml Monitor platelets by anticoagulation protocol: Yes   Plan:  Continue heparin drip at 900 units/hr. Warfarin 2 mg x 1  today  Monitor daily INR, HL, CBC, and signs of bleeding  Monitor for increased effects due to amiodarone     Thank you,   Eddie Candle, PharmD PGY-1 Pharmacy Resident   Please check amion for clinical pharmacist contact number

## 2018-12-11 NOTE — Progress Notes (Signed)
Converted  back to afib, MD aware

## 2018-12-11 NOTE — Progress Notes (Signed)
ANTICOAGULATION CONSULT NOTE - Follow Up Consult  Pharmacy Consult for Heparin and Coumadin Indication: atrial fibrillation  No Known Allergies  Patient Measurements: Height: 6\' 3"  (190.5 cm) Weight: 240 lb 15.4 oz (109.3 kg) IBW/kg (Calculated) : 84.5 Heparin Dosing Weight: 106.7 kg  Vital Signs: Temp: 98.6 F (37 C) (09/26 0005) Temp Source: Oral (09/26 0005) BP: 131/82 (09/26 0005) Pulse Rate: 84 (09/26 0005)  Labs: Recent Labs    12/08/18 1518 12/08/18 1521 12/08/18 2029 12/09/18 0024 12/09/18 0530  12/10/18 0401 12/10/18 1335 12/10/18 2326  HGB 12.7*  --   --   --  12.6*  --  12.4*  --   --   HCT 41.3  --   --   --  41.6  --  40.6  --   --   PLT 159  --   --   --  169  --  151  --   --   LABPROT  --  22.7*  --   --  21.8*  --   --  21.3*  --   INR  --  2.0*  --   --  1.9*  --   --  1.9*  --   HEPARINUNFRC  --   --   --   --   --    < > 0.92* 1.10* 0.38  CREATININE 1.21  --   --   --  1.13  --  0.93  --   --   TROPONINIHS 24*  --  30* 29*  --   --   --   --   --    < > = values in this interval not displayed.    Estimated Creatinine Clearance: 91.6 mL/min (by C-G formula based on SCr of 0.93 mg/dL).  Assessment:  73 y/o M with exertional shortness of breath, on warfarin PTA for afib, consulted to start heparin when INR is <2.  Heparin drip started on 9/24.  INR 1.9.    Heparin held earlier today when level was supratherapeutic.    Heparin level is now therapeutic (0.38) on 900 units/hr.      Warfarin resumed 9/25 pm with 2 mg dose.  New to Amiodarone this admission.    PTA warfain dose : 2.5 mg MWFSat , 1.25 mg TTSun, LD taken 9/23 at 0200 DCCV done 9/25, Post procedure EKG shows:NSR  Goal of Therapy:  INR 2-3 Heparin level 0.3-0.7 units/ml Monitor platelets by anticoagulation protocol: Yes   Plan:   Continue heparin drip at 900 units/hr.  Next heparin level, PT/INR and CBC in am.  New to amiodarone, watching for effect on INR.  Arty Baumgartner, [place] Pager: [phone] or phone: [phone] 12/11/2018,12:13 AM

## 2018-12-11 NOTE — Progress Notes (Signed)
Progress Note  Patient Name: [name] Date of Encounter: 12/11/2018  Primary Cardiologist: Candee Furbish, MD   Patient Profile     73 y.o. male with a history of CAD (s/p CABG in 2004, DES to LCx in 2011), paroxysmal atrial fibrillation (on Coumadin), hypertension, hyperlipidemia, diabetes mellitus, diastolic heart failure, previous CVA with residual left-sided deficit and obstructive sleep apnea (on nighttime oxygen 2 L)  admitted  for recurrent atrial fibrillation. DCCV 9/25 sinus >> Afib Amio started  IV>>PO  TSH 9/20  LFTs 2019 Echo 12/19 LVEF normal Cath 12/19 3/4 Grafts patent ( SVG>>Cx occluded   Mentioned had been on amio previously Chart reviewed  On for a couple of years stopped because of dyspnea and concern for pulm toxicity, DLCO was low at 55 (9/18)  But CT >>NO interstitial lung disease ( pulmonologist is Byrum)     Subjective  Very frustrated  Complaining of heat and ... not well able to clarify No dyspnea   Inpatient Medications    Scheduled Meds: . amiodarone  400 mg Oral Daily  . atorvastatin  40 mg Oral Daily  . diltiazem  180 mg Oral Daily  . furosemide  40 mg Oral Daily  . levothyroxine  112 mcg Oral QAC breakfast  . mouth rinse  15 mL Mouth Rinse BID  . sodium chloride flush  3 mL Intravenous Q12H  . warfarin  2 mg Oral ONCE-1800  . Warfarin - Pharmacist Dosing Inpatient   Does not apply q1800   Continuous Infusions: . sodium chloride Stopped (12/10/18 1123)  . heparin 900 Units/hr (12/10/18 1705)   PRN Meds: acetaminophen, ondansetron (ZOFRAN) IV, sodium chloride flush   Vital Signs    Vitals:   12/10/18 1105 12/10/18 2025 12/11/18 0005 12/11/18 0904  BP: (!) 139/98 134/83 131/82 136/84  Pulse: 86 88 84 87  Resp: 20 16 16 18   Temp:  99 F (37.2 C) 98.6 F (37 C) 98.3 F (36.8 C)  TempSrc:  Oral Oral Oral  SpO2: 98% 100% 100% 100%  Weight:      Height:        Intake/Output Summary (Last 24 hours) at 12/11/2018 1146  Last data filed at 12/11/2018 0947 Gross per 24 hour  Intake 920 ml  Output 1200 ml  Net -280 ml   Filed Weights   12/09/18 1253 12/10/18 0425  Weight: 108.2 kg 109.3 kg    Physical Exam   Well developed and nourished in no acute distress HENT normal Neck supple with JVP-  flat   Clear Irregularly irregular rapid rate and rhythm, no murmurs or gallops Abd-soft with active BS No Clubbing cyanosis edema Skin-warm and dry A & Oriented  Grossly normal sensory and motor function Emotionally labile       Labs    Chemistry Recent Labs  Lab 12/08/18 1518 12/09/18 0530 12/10/18 0401  NA 139 139 139  K 3.5 3.2* 3.5  CL 99 95* 99  CO2 28 32 29  GLUCOSE 143* 103* 131*  BUN 14 13 15   CREATININE 1.21 1.13 0.93  CALCIUM 9.2 8.9 8.9  GFRNONAA 58* >60 >60  GFRAA >60 >60 >60  ANIONGAP 12 12 11      Hematology Recent Labs  Lab 12/09/18 0530 12/10/18 0401 12/11/18 0355  WBC 6.7 5.9 7.3  RBC 4.64 4.55 4.12*  HGB 12.6* 12.4* 11.7*  HCT 41.6 40.6 36.7*  MCV 89.7 89.2 89.1  MCH 27.2 27.3 28.4  MCHC 30.3 30.5 31.9  RDW 14.2 14.3 14.4  PLT 169 151 130*    Cardiac EnzymesNo results for input(s): TROPONINI in the last 168 hours. No results for input(s): TROPIPOC in the last 168 hours.   BNP Recent Labs  Lab 12/09/18 0023  BNP 352.9*    INR 1.9>>1.6   DDimer No results for input(s): DDIMER in the last 168 hours.   Radiology    No results found. Telemetry    Personally reviewed  Sinus >> afib with RVR   ECG       Cardiac Studies   CATH 02/19/2018 1. Left dominant circulation 2. Severe 3 vessel occlusive CAD. - 95% ostial LAD - 100% first diagonal - 100% ostial LCx. This is new since 2017 - was 95% prior - 100% nondominant RCA 3. Patent LIMA to the LAD. There is chronic severe disease in the apical LAD 4. Patent SVG to the first diagonal and SVG to the first OM as a Y graft. 5. Chronic occlusion of the SVG to terminal LCx 6. Normal LVEDP   ECHO 02/18/2018  - Left ventricle: The cavity size was normal. There was mild focal basal hypertrophy of the septum. Systolic function was normal. The estimated ejection fraction was in the range of 55% to 60%. Wall motion was normal; there were no regional wall motion abnormalities. Doppler parameters are consistent with abnormal left ventricular relaxation (grade 1 diastolic dysfunction). Doppler parameters are consistent with elevated ventricular end-diastolic filling pressure. - Mitral valve: Calcified annulus. Mildly thickened leaflets . There was mild regurgitation. - Left atrium: The atrium was mildly dilated. - Right ventricle: The cavity size was mildly dilated. Wall thickness was normal. Systolic function was mildly reduced. - Right atrium: The atrium was normal in size. - Tricuspid valve: There was mild regurgitation. - Pulmonary arteries: Systolic pressure was within the normal range. - Inferior vena cava: The vessel was normal in size. - Pericardium, extracardiac: There was no pericardial effusion.   Assessment & Plan    1. Atrial fibrillation with rapid ventricular response:>>DCCV>>SInus  >>Afib   2.  Chronic diastolic heart failure   3.  Coronary artery disease status post CABG:  Revereted to afib  Will increase dilt as RVR and increase amio  Reviewed chart as outlined above re amiodarone,  Will order PFTs with DLCO and ask for [place] input Continue amio for now, will be in house for a few more days while his INR becomes therapeutic and decision can be made at that point whether to try DCCV again prioir to discharge Check LFTs on amio      Virl Axe  12/11/2018, 11:46 AM     For questions or updates, please contact   Please consult [email or web address] for contact info under Cardiology/STEMI.

## 2018-12-12 ENCOUNTER — Encounter (HOSPITAL_COMMUNITY): Payer: Self-pay | Admitting: Cardiovascular Disease

## 2018-12-12 ENCOUNTER — Inpatient Hospital Stay (HOSPITAL_COMMUNITY): Payer: Medicare Other

## 2018-12-12 LAB — PROTIME-INR
INR: 1.6 — ABNORMAL HIGH (ref 0.8–1.2)
INR: 1.7 — ABNORMAL HIGH (ref 0.8–1.2)
Prothrombin Time: 18.5 seconds — ABNORMAL HIGH (ref 11.4–15.2)
Prothrombin Time: 19.7 seconds — ABNORMAL HIGH (ref 11.4–15.2)

## 2018-12-12 LAB — CBC
HCT: 34 % — ABNORMAL LOW (ref 39.0–52.0)
HCT: 29.2 % — ABNORMAL LOW (ref 39.0–52.0)
Hemoglobin: 10.5 g/dL — ABNORMAL LOW (ref 13.0–17.0)
Hemoglobin: 9.3 g/dL — ABNORMAL LOW (ref 13.0–17.0)
MCH: 27.7 pg (ref 26.0–34.0)
MCH: 28.4 pg (ref 26.0–34.0)
MCHC: 30.9 g/dL (ref 30.0–36.0)
MCHC: 31.8 g/dL (ref 30.0–36.0)
MCV: 89.7 fL (ref 80.0–100.0)
MCV: 89 fL (ref 80.0–100.0)
Platelets: 153 10*3/uL (ref 150–400)
Platelets: 175 10*3/uL (ref 150–400)
RBC: 3.79 MIL/uL — ABNORMAL LOW (ref 4.22–5.81)
RBC: 3.28 MIL/uL — ABNORMAL LOW (ref 4.22–5.81)
RDW: 14.5 % (ref 11.5–15.5)
RDW: 14.5 % (ref 11.5–15.5)
WBC: 9.1 10*3/uL (ref 4.0–10.5)
WBC: 10.2 10*3/uL (ref 4.0–10.5)
nRBC: 0 % (ref 0.0–0.2)
nRBC: 0 % (ref 0.0–0.2)

## 2018-12-12 LAB — BASIC METABOLIC PANEL
Anion gap: 13 (ref 5–15)
BUN: 16 mg/dL (ref 8–23)
CO2: 27 mmol/L (ref 22–32)
Calcium: 8.8 mg/dL — ABNORMAL LOW (ref 8.9–10.3)
Chloride: 99 mmol/L (ref 98–111)
Creatinine, Ser: 1.46 mg/dL — ABNORMAL HIGH (ref 0.61–1.24)
GFR calc Af Amer: 54 mL/min — ABNORMAL LOW (ref >60)
GFR calc non Af Amer: 46 mL/min — ABNORMAL LOW (ref >60)
Glucose, Bld: 223 mg/dL — ABNORMAL HIGH (ref 70–99)
Potassium: 3.7 mmol/L (ref 3.5–5.1)
Sodium: 139 mmol/L (ref 135–145)

## 2018-12-12 LAB — HEPARIN LEVEL (UNFRACTIONATED)
Heparin Unfractionated: 0.25 IU/mL — ABNORMAL LOW (ref 0.30–0.70)
Heparin Unfractionated: 0.43 IU/mL (ref 0.30–0.70)

## 2018-12-12 LAB — GLUCOSE, CAPILLARY
Glucose-Capillary: 111 mg/dL — ABNORMAL HIGH (ref 70–99)
Glucose-Capillary: 226 mg/dL — ABNORMAL HIGH (ref 70–99)

## 2018-12-12 LAB — TYPE AND SCREEN
ABO/RH(D): AB POS
Antibody Screen: NEGATIVE

## 2018-12-12 LAB — ABO/RH: ABO/RH(D): AB POS

## 2018-12-12 LAB — COMPREHENSIVE METABOLIC PANEL
ALT: 35 U/L (ref 0–44)
AST: 43 U/L — ABNORMAL HIGH (ref 15–41)
Albumin: 3.1 g/dL — ABNORMAL LOW (ref 3.5–5.0)
Alkaline Phosphatase: 53 U/L (ref 38–126)
Anion gap: 9 (ref 5–15)
BUN: 10 mg/dL (ref 8–23)
CO2: 31 mmol/L (ref 22–32)
Calcium: 8.8 mg/dL — ABNORMAL LOW (ref 8.9–10.3)
Chloride: 100 mmol/L (ref 98–111)
Creatinine, Ser: 1.01 mg/dL (ref 0.61–1.24)
GFR calc Af Amer: >60 mL/min (ref >60)
GFR calc non Af Amer: >60 mL/min (ref >60)
Glucose, Bld: 129 mg/dL — ABNORMAL HIGH (ref 70–99)
Potassium: 3.8 mmol/L (ref 3.5–5.1)
Sodium: 140 mmol/L (ref 135–145)
Total Bilirubin: 2 mg/dL — ABNORMAL HIGH (ref 0.3–1.2)
Total Protein: 6.2 g/dL — ABNORMAL LOW (ref 6.5–8.1)

## 2018-12-12 LAB — APTT: aPTT: 60 seconds — ABNORMAL HIGH (ref 24–36)

## 2018-12-12 MED ORDER — SENNOSIDES-DOCUSATE SODIUM 8.6-50 MG PO TABS
1.0000 | ORAL_TABLET | Freq: Every evening | ORAL | Status: DC | PRN
  Administered 2018-12-12 – 2018-12-16 (×4): 1 via ORAL
  Filled 2018-12-12 (×5): qty 1

## 2018-12-12 MED ORDER — HEPARIN BOLUS VIA INFUSION
1500.0000 [IU] | Freq: Once | INTRAVENOUS | Status: AC
  Administered 2018-12-12: 1500 [IU] via INTRAVENOUS
  Filled 2018-12-12: qty 1500

## 2018-12-12 MED ORDER — FUROSEMIDE 80 MG PO TABS
80.0000 mg | ORAL_TABLET | Freq: Every day | ORAL | Status: DC
  Administered 2018-12-12 – 2018-12-13 (×2): 80 mg via ORAL
  Filled 2018-12-12 (×2): qty 1

## 2018-12-12 MED ORDER — WARFARIN SODIUM 2.5 MG PO TABS
2.5000 mg | ORAL_TABLET | Freq: Once | ORAL | Status: DC
  Filled 2018-12-12: qty 1

## 2018-12-12 MED ORDER — PROTAMINE SULFATE 10 MG/ML IV SOLN
25.0000 mg | Freq: Once | INTRAVENOUS | Status: AC
  Administered 2018-12-12: 25 mg via INTRAVENOUS
  Filled 2018-12-12: qty 2.5

## 2018-12-12 MED ORDER — HYDROXYZINE HCL 25 MG PO TABS
25.0000 mg | ORAL_TABLET | Freq: Once | ORAL | Status: AC
  Administered 2018-12-12: 25 mg via ORAL
  Filled 2018-12-12: qty 1

## 2018-12-12 NOTE — Progress Notes (Signed)
Patient still sleeping. Call from pharmacy inquiring about Heparin. Heparin is still infusing at 9, no signs of bleeding or infusion complications. Per pharmacy he will be receiving a bolus plus increase in rate, d/t dropping heparin level.

## 2018-12-12 NOTE — Significant Event (Signed)
Rapid Response Event Note  Overview: Neurologic - Weakness  Initial Focused Assessment: Asked to see patient with concerns of weakness, per nurse, patient has had a stroke in the past with residual LT sided weakness. Upon arrival, patient was awake and alert, oriented x 3, mild dysarthria at times (per patient - not new). No focal neuro deficits noticed, no drift in any extremity, no sensory deficits, NIH 0. Mild facial asymmetry at times but residual from previous stroke. + Heparin infusion  Patient was more frustrated about his overall recovery, feeling generally weak, and he was upset about his medication regimen. Patient seems to be struggling emotionally.  Interventions: -- No RRT Interventions   Plan of Care: -- RN to primary service provider about PT/OT while patient remains hospitalized.  -- Perhaps CSW to help with psychosocial needs   Event Summary:  Call Time [place]  End Time 1210  Earsie Humm R

## 2018-12-12 NOTE — Progress Notes (Signed)
Page sent to Cards PA.   3e21 [facility] requesting sennokot order for constipation, states that what he takes at home.

## 2018-12-12 NOTE — Progress Notes (Signed)
Follow up Rapid Response Note  -Mr. Malinak was resting quietly. No distress. Continues to be pale but no longer clammy and diaphoretic. -Left Chest hematoma at line marked by myself at 2000. -H/H 9.3/27.2, INR 1.7 -HR 115 afib, 113/66, RR 18 with sats 99% on [facility] -Please call primary svc and/or RRRN for any further change or assistance      Madelynn Done

## 2018-12-12 NOTE — Significant Event (Signed)
Rapid Response Event Note  Called by nurse at 1908 about patient now being clammy, diaphoretic, and skin being pale. I was with another patient and I arrived at 4. When I arrived, nursing staff were helping the patient reposition in the bed, primary nurse pointed out that patient now large hematoma LEFT LATERAL chest, it spreads into the lateral aspect of the back, its black/blue, and quite large.   I did not see this earlier however, I did not remove his gown, I only saw him for neurologic symptoms. Current BP 116/87(96), HR 99, 98% on 2L , RR - mild shortness of breath, 18-22, diminished lung sounds in the bases bilaterally. Cards MD came to bedside.  Interventions: -- STAT CXR -- STAT LABS (CBC/BMP/T&S/COAGs)  Plan: -- MD ordered Protamine 25 mg IV  -- Will monitor closely.  -- RN to updated MD in an hour. -- Low threshold to move to ICU, I discussed that with CARDS MD as well.  Call Time [place]  End Time 2005  Manasseh Pittsley, Delice Lesch

## 2018-12-12 NOTE — Progress Notes (Signed)
ANTICOAGULATION CONSULT NOTE - Follow Up Consult  Pharmacy Consult for Heparin and Coumadin Indication: atrial fibrillation  No Known Allergies  Patient Measurements: Height: 6\' 3"  (190.5 cm) Weight: 240 lb 15.4 oz (109.3 kg) IBW/kg (Calculated) : 84.5 Heparin Dosing Weight: 106.7 kg  Vital Signs: Temp: 98.2 F (36.8 C) (09/27 0531) Temp Source: Oral (09/27 0531) BP: 114/75 (09/27 0531) Pulse Rate: 102 (09/27 0737)  Labs: Recent Labs    12/10/18 0401 12/10/18 1335 12/10/18 2326 12/11/18 0355 12/12/18 0448  HGB 12.4*  --   --  11.7* 10.5*  HCT 40.6  --   --  36.7* 34.0*  PLT 151  --   --  130* 153  LABPROT  --  21.3*  --  19.1* 18.5*  INR  --  1.9*  --  1.6* 1.6*  HEPARINUNFRC 0.92* 1.10* 0.38 0.35 0.25*  CREATININE 0.93  --   --   --  1.01    Estimated Creatinine Clearance: 84.4 mL/min (by C-G formula based on SCr of 1.01 mg/dL).  Assessment:  73 y/o M with exertional shortness of breath, on warfarin PTA for afib (  PTA warfain dose 2.5 mg MWFSat, 1.25 mg TTSun, LD taken 9/23 at 0200), DCCV done 9/25, Post procedure EKG shows:NSR. consulted to start heparin when INR is <2.  Heparin drip started on 9/24.   Warfarin resumed 9/25 pm with 2 mg dose.  New to Amiodarone this admission.  Heparin level this am is subtherapeutic at 0.25 on 900 units/hr. INR is still subtherapeutic at 1.6. H&H is trending down slightly but stable at 10.5/34.0, plts wnl. Confirmed with nursing that there are no infusion issues.    Goal of Therapy:  INR 2-3 Heparin level 0.3-0.7 units/ml Monitor platelets by anticoagulation protocol: Yes   Plan:  Bolus heparin with 1500 units Increase heparin drip to 1100 units/hr. Warfarin 2.5 mg x 1 today  Monitor 8 hour heparin level Monitor daily INR, HL, CBC, and signs of bleeding  Monitor for increased effects due to amiodarone     Thank you,   Eddie Candle, PharmD PGY-1 Pharmacy Resident   Please check amion for clinical pharmacist  contact number

## 2018-12-12 NOTE — Progress Notes (Signed)
Progress Note  Patient Name: [name] Date of Encounter: 12/12/2018  Primary Cardiologist: Candee Furbish, MD   Patient Profile     73 y.o. male with a history of CAD (s/p CABG in 2004, DES to LCx in 2011), paroxysmal atrial fibrillation (on Coumadin), hypertension, hyperlipidemia, diabetes mellitus, diastolic heart failure, previous CVA with residual left-sided deficit and obstructive sleep apnea (on nighttime oxygen 2 L)  admitted  for recurrent atrial fibrillation. DCCV 9/25 sinus >> Afib Amio started  IV>>PO  TSH 9/20  LFTs 2019 Echo 12/19 LVEF normal Cath 12/19 3/4 Grafts patent ( SVG>>Cx occluded   Mentioned had been on amio previously Chart reviewed  On for a couple of years stopped because of dyspnea and concern for pulm toxicity, DLCO was low at 55 (9/18)  But CT >>NO interstitial lung disease ( pulmonologist is [place]rum)     Subjective  Very sleepy Lying flat no specific complaints but just not right No dyspnea   Inpatient Medications    Scheduled Meds: . amiodarone  400 mg Oral BID  . atorvastatin  40 mg Oral Daily  . diltiazem  120 mg Oral BID  . furosemide  40 mg Oral Daily  . levothyroxine  112 mcg Oral QAC breakfast  . mouth rinse  15 mL Mouth Rinse BID  . sodium chloride flush  3 mL Intravenous Q12H  . Warfarin - Pharmacist Dosing Inpatient   Does not apply q1800   Continuous Infusions: . sodium chloride Stopped (12/10/18 1123)  . heparin 900 Units/hr (12/10/18 1705)   PRN Meds: acetaminophen, ondansetron (ZOFRAN) IV, sodium chloride flush   Vital Signs    Vitals:   12/11/18 1154 12/11/18 1717 12/12/18 0531 12/12/18 0737  BP: (!) 131/106 (!) 128/93 114/75   Pulse: (!) 117 (!) 119 (!) 112 (!) 102  Resp: 18  16   Temp: 98.3 F (36.8 C)  98.2 F (36.8 C)   TempSrc: Oral  Oral   SpO2: 95%  100%   Weight:      Height:        Intake/Output Summary (Last 24 hours) at 12/12/2018 0800 Last data filed at 12/12/2018 0737 Gross per 24 hour   Intake 1161.54 ml  Output 1200 ml  Net -38.46 ml   Filed Weights   12/09/18 1253 12/10/18 0425  Weight: 108.2 kg 109.3 kg    Physical Exam   Well developed and nourished in no acute distress HENT normal Neck supple   Carotids brisk and full without bruits Clear Irregularly irregular rate and rhythm with controlled ventricular response, no murmurs or gallops Abd-soft with active BS without hepatomegaly No Clubbing cyanosis  Tr edema Skin-warm and dry A & Oriented  Grossly normal sensory and motor function      Labs    Chemistry Recent Labs  Lab 12/09/18 0530 12/10/18 0401 12/12/18 0448  NA 139 139 140  K 3.2* 3.5 3.8  CL 95* 99 100  CO2 32 29 31  GLUCOSE 103* 131* 129*  BUN 13 15 10   CREATININE 1.13 0.93 1.01  CALCIUM 8.9 8.9 8.8*  PROT  --   --  6.2*  ALBUMIN  --   --  3.1*  AST  --   --  43*  ALT  --   --  35  ALKPHOS  --   --  53  BILITOT  --   --  2.0*  GFRNONAA >60 >60 >60  GFRAA >60 >60 >60  ANIONGAP 12  11 9     Hematology Recent Labs  Lab 12/10/18 0401 12/11/18 0355 12/12/18 0448  WBC 5.9 7.3 9.1  RBC 4.55 4.12* 3.79*  HGB 12.4* 11.7* 10.5*  HCT 40.6 36.7* 34.0*  MCV 89.2 89.1 89.7  MCH 27.3 28.4 27.7  MCHC 30.5 31.9 30.9  RDW 14.3 14.4 14.5  PLT 151 130* 153    Cardiac EnzymesNo results for input(s): TROPONINI in the last 168 hours. No results for input(s): TROPIPOC in the last 168 hours.   BNP Recent Labs  Lab 12/09/18 0023  BNP 352.9*    INR 1.9>>1.6 9/26 9/27 >> 1.6  DDimer No results for input(s): DDIMER in the last 168 hours.   Radiology    No results found. Telemetry    Personally reviewed  Sinus >> afib with RVR   ECG       Cardiac Studies   CATH 02/19/2018 1. Left dominant circulation 2. Severe 3 vessel occlusive CAD. - 95% ostial LAD - 100% first diagonal - 100% ostial LCx. This is new since 2017 - was 95% prior - 100% nondominant RCA 3. Patent LIMA to the LAD. There is chronic severe  disease in the apical LAD 4. Patent SVG to the first diagonal and SVG to the first OM as a Y graft. 5. Chronic occlusion of the SVG to terminal LCx 6. Normal LVEDP  ECHO 02/18/2018  - Left ventricle: The cavity size was normal. There was mild focal basal hypertrophy of the septum. Systolic function was normal. The estimated ejection fraction was in the range of 55% to 60%. Wall motion was normal; there were no regional wall motion abnormalities. Doppler parameters are consistent with abnormal left ventricular relaxation (grade 1 diastolic dysfunction). Doppler parameters are consistent with elevated ventricular end-diastolic filling pressure. - Mitral valve: Calcified annulus. Mildly thickened leaflets . There was mild regurgitation. - Left atrium: The atrium was mildly dilated. - Right ventricle: The cavity size was mildly dilated. Wall thickness was normal. Systolic function was mildly reduced. - Right atrium: The atrium was normal in size. - Tricuspid valve: There was mild regurgitation. - Pulmonary arteries: Systolic pressure was within the normal range. - Inferior vena cava: The vessel was normal in size. - Pericardium, extracardiac: There was no pericardial effusion.   Assessment & Plan    1. Atrial fibrillation with rapid ventricular response:>>DCCV>>SInus  >>Afib    2.  Chronic diastolic heart failure   3.  Coronary artery disease status post CABG:    HR a little slower in Afib INR remains subtherapeutic  Continue heparin Anticipate DCCV attempt prior to discharge Continue diuresis; biggest culprit right now is IV heparin-- but O keeping up, one extra dose of furosemide today      Virl Axe  12/12/2018, 8:00 AM     For questions or updates, please contact   Please consult [email or web address] for contact info under Cardiology/STEMI.

## 2018-12-12 NOTE — Progress Notes (Signed)
Upon entering patient room  At 1900 to give evening meds, pt found accompanied by another nurse weak making effort to stand out of bed.   pts color is vastly different than earlier in the day. Pt appears increasingly weak, pale/jaundiced at this time. Pt is also diaphoretic.  Rapid called to bedside, as vitals cbg ekg are obtained.   Call placed to Cardiology on call; situation relayed to provider who will come to see patient.

## 2018-12-12 NOTE — Progress Notes (Signed)
Pharmacy notified of heparin pause, held coumadin until cards can advise otherwise.

## 2018-12-12 NOTE — Progress Notes (Signed)
Pt with consistent tachycardia since admission, HR is elevating patient's MEWS score. Currently at rest patient's heart rate is 102. With this update, patient's MEWS score has reduced to 1.      Vital Signs MEWS/VS Documentation      12/11/2018 1900 12/11/2018 2242 12/12/2018 0531 12/12/2018 0737   MEWS Score:  2  2  2  1    MEWS Score Color:  Yellow  Yellow  Yellow  Green   Resp:  -  -  16  -   Pulse:  -  -  (!) 112  (!) 102   BP:  -  -  114/75  -   Temp:  -  -  98.2 F (36.8 C)  -   O2 Device:  -  -  Nasal Cannula  -   Level of Consciousness:  -  Alert  -  Alert           Maud Deed Tobias-Diakun 12/12/2018,7:42 AM

## 2018-12-12 NOTE — Progress Notes (Signed)
Patient sleeping during shift report.    Heparin drip infusing at 9.

## 2018-12-12 NOTE — Significant Event (Signed)
Cardiology Progress Note  Called to bedside to assess hematoma found by nurse over left chest, underneath the axilla.  On exam, patient has a large area of bruising that is tense and tender to palpations extending from the lateral left chest across to his back.  He denies any trauma to the area.  Suspect spontaneous chest wall hematoma.  No trauma per staff.  Plan d/c heparin and warfarin.  Will give 25 mg IV protamine to reverse heparin as hematoma is large and tense, and may worsen without reversal of anticoagulation.  Labs pending including CBC and type and screen.  Hold heparin/warfarin.

## 2018-12-12 NOTE — Progress Notes (Signed)
Patient remains asleep, only partially awakens when staff and MD enter the room. MD at bedside to assess, voices no changes in treatment today. Medications are available for when patient is awake to safely take them.

## 2018-12-13 DIAGNOSIS — N6489 Other specified disorders of breast: Secondary | ICD-10-CM

## 2018-12-13 DIAGNOSIS — D500 Iron deficiency anemia secondary to blood loss (chronic): Secondary | ICD-10-CM

## 2018-12-13 LAB — CBC
HCT: 26.5 % — ABNORMAL LOW (ref 39.0–52.0)
HCT: 25.1 % — ABNORMAL LOW (ref 39.0–52.0)
HCT: 24.1 % — ABNORMAL LOW (ref 39.0–52.0)
Hemoglobin: 8.2 g/dL — ABNORMAL LOW (ref 13.0–17.0)
Hemoglobin: 8.2 g/dL — ABNORMAL LOW (ref 13.0–17.0)
Hemoglobin: 7.5 g/dL — ABNORMAL LOW (ref 13.0–17.0)
MCH: 27.3 pg (ref 26.0–34.0)
MCH: 28.5 pg (ref 26.0–34.0)
MCH: 27.6 pg (ref 26.0–34.0)
MCHC: 30.9 g/dL (ref 30.0–36.0)
MCHC: 32.7 g/dL (ref 30.0–36.0)
MCHC: 31.1 g/dL (ref 30.0–36.0)
MCV: 88.3 fL (ref 80.0–100.0)
MCV: 87.2 fL (ref 80.0–100.0)
MCV: 88.6 fL (ref 80.0–100.0)
Platelets: 164 10*3/uL (ref 150–400)
Platelets: 170 10*3/uL (ref 150–400)
Platelets: 175 10*3/uL (ref 150–400)
RBC: 3 MIL/uL — ABNORMAL LOW (ref 4.22–5.81)
RBC: 2.88 MIL/uL — ABNORMAL LOW (ref 4.22–5.81)
RBC: 2.72 MIL/uL — ABNORMAL LOW (ref 4.22–5.81)
RDW: 14.7 % (ref 11.5–15.5)
RDW: 14.6 % (ref 11.5–15.5)
RDW: 14.9 % (ref 11.5–15.5)
WBC: 13.6 10*3/uL — ABNORMAL HIGH (ref 4.0–10.5)
WBC: 14.5 10*3/uL — ABNORMAL HIGH (ref 4.0–10.5)
WBC: 15 10*3/uL — ABNORMAL HIGH (ref 4.0–10.5)
nRBC: 0 % (ref 0.0–0.2)
nRBC: 0 % (ref 0.0–0.2)
nRBC: 0 % (ref 0.0–0.2)

## 2018-12-13 LAB — GLUCOSE, CAPILLARY
Glucose-Capillary: 148 mg/dL — ABNORMAL HIGH (ref 70–99)
Glucose-Capillary: 196 mg/dL — ABNORMAL HIGH (ref 70–99)

## 2018-12-13 LAB — PROTIME-INR
INR: 1.6 — ABNORMAL HIGH (ref 0.8–1.2)
Prothrombin Time: 19 seconds — ABNORMAL HIGH (ref 11.4–15.2)

## 2018-12-13 MED ORDER — INSULIN ASPART 100 UNIT/ML ~~LOC~~ SOLN
0.0000 [IU] | Freq: Three times a day (TID) | SUBCUTANEOUS | Status: DC
  Administered 2018-12-13: 3 [IU] via SUBCUTANEOUS
  Administered 2018-12-13: 2 [IU] via SUBCUTANEOUS
  Administered 2018-12-14: 3 [IU] via SUBCUTANEOUS
  Administered 2018-12-14 (×2): 2 [IU] via SUBCUTANEOUS
  Administered 2018-12-15: 3 [IU] via SUBCUTANEOUS
  Administered 2018-12-15 – 2018-12-16 (×2): 2 [IU] via SUBCUTANEOUS

## 2018-12-13 MED ORDER — DILTIAZEM HCL ER COATED BEADS 120 MG PO CP24
120.0000 mg | ORAL_CAPSULE | Freq: Two times a day (BID) | ORAL | Status: DC
  Administered 2018-12-13: 120 mg via ORAL
  Filled 2018-12-13 (×2): qty 1

## 2018-12-13 MED ORDER — DILTIAZEM HCL ER COATED BEADS 240 MG PO CP24: 240.0000 mg | ORAL_CAPSULE | Freq: Two times a day (BID) | ORAL | Status: DC

## 2018-12-13 MED ORDER — DILTIAZEM HCL ER COATED BEADS 120 MG PO CP24: 120.0000 mg | ORAL_CAPSULE | Freq: Once | ORAL | Status: DC

## 2018-12-13 NOTE — Progress Notes (Signed)
Inpatient Diabetes Program Recommendations  AACE/ADA: New Consensus Statement on Inpatient Glycemic Control (2015)  Target Ranges:  Prepandial:   less than 140 mg/dL      Peak postprandial:   less than 180 mg/dL (1-2 hours)      Critically ill patients:  140 - 180 mg/dL   Results for [name], [name] (MRN [id number]) as of 12/13/2018 09:55  Ref. Range 12/12/2018 19:56  Glucose Latest Ref Range: 70 - 99 mg/dL 223 (H)   Results for [name], [name] (MRN [id number]) as of 12/13/2018 09:55  Ref. Range 11/28/2018 00:02  Hemoglobin A1C Latest Ref Range: 4.8 - 5.6 % 6.9 (H)    Admit with: Exertional shortness of breath and leg swelling  History: DM  Home DM Meds: Metformin 500 mg Daily  Current Orders: None     MD- Note patient with History of Type 2 Diabetes.  Takes Metformin at home.  Do not see current orders for CBG checks.  Please consider adding orders for Novolog Sensitive Correction Scale/ SSI (0-9 units) TID AC + HS     --Will follow patient during hospitalization--  Wyn Quaker RN, MSN, CDE Diabetes Coordinator Inpatient Glycemic Control Team Team Pager: [phone] (8a-5p)

## 2018-12-13 NOTE — Significant Event (Addendum)
Rapid Response Event Note  Overview: Follow Up  I came to see [name] at shift change, he was awake, he stated that he did not sleep well last night. I examined the hematoma, it appears to be about the same size, bruising is darker and it it harder upon palpation but the overall size appears the relatively same. VS: HR AF 120s, BP 111/67, 99% on 2L [place], skin is warm/dry, lung sounds - clear in the upper fields, diminished in the lower fields, not in acute distress.   Patient is again quite frustrated, he wants to be in the chair, he wants to walk, feels like he is not progressing. I addressed his concerns again this morning, I also did yesterday.  No RRT INTERVENTIONS   Zebedee Segundo R

## 2018-12-13 NOTE — Progress Notes (Signed)
Noticed that there is also bruising on the right axilla/ flank side, marked. Cards Pa, Sarajane Jews, paged made aware

## 2018-12-13 NOTE — Evaluation (Signed)
Physical Therapy Evaluation and Discharge Patient Details Name: [name] MRN: [id number] DOB: [birth year] Today's Date: 12/13/2018   History of Present Illness  Pt is 73 yo male with onset of a-fib was admitted for cardioversion, done 9/25 and referred to PT.  Pt was lethargic and had difficulty with hematoma forming.  Order for PT was discontinued after PT saw him.  PMHx:  CABG, PAF, encephalopathy, respiratory failure, OSA, DM, gout, HTN  Clinical Impression  Pt was seen and order had been discontinued when PT re-entered chart.  Pt is weak, cannot assist movement and is max of 2 for any mobility.  Follow up with MD as ordered, PT discharged for now.    Follow Up Recommendations SNF    Equipment Recommendations  None recommended by PT    Recommendations for Other Services       Precautions / Restrictions Precautions Precautions: Fall(monitor HR) Restrictions Weight Bearing Restrictions: No      Mobility  Bed Mobility Overal bed mobility: Needs Assistance Bed Mobility: Supine to Sit;Sit to Supine     Supine to sit: Max assist Sit to supine: Max assist   General bed mobility comments: unable to stand  Transfers                 General transfer comment: pt is unable  Ambulation/Gait             General Gait Details: unable  Stairs            Wheelchair Mobility    Modified Rankin (Stroke Patients Only)       Balance                                             Pertinent Vitals/Pain Pain Assessment: Faces Faces Pain Scale: Hurts little more Pain Location: L chest    Home Living Family/patient expects to be discharged to:: Private residence Living Arrangements: Spouse/significant other Available Help at Discharge: Family Type of Home: House Home Access: Stairs to enter Entrance Stairs-Rails: Left Entrance Stairs-Number of Steps: 3-4 Home Layout: One level   Additional Comments: pt reports a fully accessible  home    Prior Function Level of Independence: Independent         Comments: typically active, drove and able to walk without AD until 2 weeks ago     Hand Dominance        Extremity/Trunk Assessment   Upper Extremity Assessment Upper Extremity Assessment: Generalized weakness    Lower Extremity Assessment Lower Extremity Assessment: (has 4- to 4 LE strength)    Cervical / Trunk Assessment Cervical / Trunk Assessment: Normal  Communication   Communication: No difficulties  Cognition Arousal/Alertness: Lethargic Behavior During Therapy: Flat affect Overall Cognitive Status: No family/caregiver present to determine baseline cognitive functioning                                 General Comments: limited history      General Comments General comments (skin integrity, edema, etc.): Pt is weak, able to sit with fair balance but not able to ambulate or stand    Exercises     Assessment/Plan    PT Assessment (MD has discontinued PT)  PT Problem List Decreased strength;Decreased range of motion;Decreased activity tolerance;Decreased balance;Decreased mobility;Decreased coordination;Decreased cognition;Decreased knowledge of use  of DME;Decreased safety awareness;Cardiopulmonary status limiting activity;Decreased skin integrity;Pain       PT Treatment Interventions      PT Goals (Current goals can be found in the Care Plan section)  Acute Rehab PT Goals Patient Stated Goal: none PT Goal Formulation: Patient unable to participate in goal setting    Frequency     Barriers to discharge Inaccessible home environment;Decreased caregiver support unable to stand and wife cannot help unless she is able to have a second person    Co-evaluation               AM-PAC PT "6 Clicks" Mobility  Outcome Measure Help needed turning from your back to your side while in a flat bed without using bedrails?: A Lot Help needed moving from lying on your back to  sitting on the side of a flat bed without using bedrails?: A Lot Help needed moving to and from a bed to a chair (including a wheelchair)?: Total Help needed standing up from a chair using your arms (e.g., wheelchair or bedside chair)?: Total Help needed to walk in hospital room?: Total Help needed climbing 3-5 steps with a railing? : Total 6 Click Score: 8    End of Session   Activity Tolerance: Patient limited by pain;Patient limited by lethargy Patient left: in bed;with call bell/phone within reach;with bed alarm set Nurse Communication: Mobility status PT Visit Diagnosis: Difficulty in walking, not elsewhere classified (R26.2)    Time: 1030-1056 PT Time Calculation (min) (ACUTE ONLY): 26 min   Charges:   PT Evaluation $PT Eval Moderate Complexity: 1 Mod PT Treatments $Therapeutic Activity: 8-22 mins       Ramond Dial 12/13/2018, 11:10 AM   Mee Hives, PT MS Acute Rehab Dept. Number: [place] and [place]

## 2018-12-13 NOTE — Progress Notes (Signed)
Size of hematoma to left chest and back unchanged.

## 2018-12-13 NOTE — Progress Notes (Signed)
Cardiology Progress Note  Patient ID: [name] MRN: [id number] DOB: [birth year] Date of Encounter: 12/13/2018  Primary Cardiologist: Candee Furbish, MD  Subjective  Heart rate in the 100-110 range.  Noted to have a chest wall hematoma yesterday, which is fairly extensive over the left axilla, into the left chest and left back regions.  Per the markings this does not appear to be expanding.  Hemoglobin has dropped from around 13 down to 8.  Given protamine overnight.  ROS:  All other ROS reviewed and negative. Pertinent positives noted in the HPI.     Inpatient Medications  Scheduled Meds: . atorvastatin  40 mg Oral Daily  . diltiazem  120 mg Oral Once  . diltiazem  240 mg Oral BID  . levothyroxine  112 mcg Oral QAC breakfast  . mouth rinse  15 mL Mouth Rinse BID  . sodium chloride flush  3 mL Intravenous Q12H   Continuous Infusions: . sodium chloride Stopped (12/10/18 1123)   PRN Meds: acetaminophen, ondansetron (ZOFRAN) IV, senna-docusate, sodium chloride flush   Vital Signs   Vitals:   12/12/18 2229 12/13/18 0420 12/13/18 0631 12/13/18 0905  BP: 131/75 119/76  103/69  Pulse: (!) 52 (!) 46  (!) 121  Resp: 20 20    Temp: 97.7 F (36.5 C) 99.4 F (37.4 C)  (!) 97.5 F (36.4 C)  TempSrc: Oral Oral  Oral  SpO2: 100% 99%  99%  Weight:   107.7 kg   Height:        Intake/Output Summary (Last 24 hours) at 12/13/2018 0954 Last data filed at 12/13/2018 0900 Gross per 24 hour  Intake 600.97 ml  Output 1150 ml  Net -549.03 ml   Last 3 Weights 12/13/2018 12/10/2018 12/09/2018  Weight (lbs) 237 lb 7 oz 240 lb 15.4 oz 238 lb 8 oz  Weight (kg) 107.7 kg 109.3 kg 108.183 kg      Telemetry  Overnight telemetry shows atrial fibrillation with heart rate in the 100-110 range, which I personally reviewed.   Physical Exam   Vitals:   12/12/18 2229 12/13/18 0420 12/13/18 0631 12/13/18 0905  BP: 131/75 119/76  103/69  Pulse: (!) 52 (!) 46  (!) 121  Resp: 20 20    Temp: 97.7  F (36.5 C) 99.4 F (37.4 C)  (!) 97.5 F (36.4 C)  TempSrc: Oral Oral  Oral  SpO2: 100% 99%  99%  Weight:   107.7 kg   Height:         Intake/Output Summary (Last 24 hours) at 12/13/2018 0954 Last data filed at 12/13/2018 0900 Gross per 24 hour  Intake 600.97 ml  Output 1150 ml  Net -549.03 ml    Last 3 Weights 12/13/2018 12/10/2018 12/09/2018  Weight (lbs) 237 lb 7 oz 240 lb 15.4 oz 238 lb 8 oz  Weight (kg) 107.7 kg 109.3 kg 108.183 kg    Body mass index is 29.68 kg/m.  General: Obese male in no acute distress Head: Atraumatic, normal size  Eyes: PEERLA, EOMI  Neck: Supple, no JVD Endocrine: No thryomegaly Cardiac: Irregular rhythm, no murmurs rubs or gallops Lungs: Clear to auscultation bilaterally, no wheezing, rhonchi or rales  Abd: Soft, nontender, no hepatomegaly  Ext: No edema, pulses 2+ Musculoskeletal: No deformities, BUE and BLE strength normal and equal Skin: Extensive hematoma noted in the left chest wall, extends into the left pectoral region as well as left back.  From the markings, this does not appear to be expanding Neuro: Alert  and oriented to person, place, time, and situation, CNII-XII grossly intact, no focal deficits  Psych: Normal mood and affect   Labs  High Sensitivity Troponin:   Recent Labs  Lab 11/27/18 1527 11/27/18 1750 12/08/18 1518 12/08/18 2029 12/09/18 0024  TROPONINIHS 47* 49* 24* 30* 29*     Cardiac EnzymesNo results for input(s): TROPONINI in the last 168 hours. No results for input(s): TROPIPOC in the last 168 hours.  Chemistry Recent Labs  Lab 12/10/18 0401 12/12/18 0448 12/12/18 1956  NA 139 140 139  K 3.5 3.8 3.7  CL 99 100 99  CO2 29 31 27   GLUCOSE 131* 129* 223*  BUN 15 10 16   CREATININE 0.93 1.01 1.46*  CALCIUM 8.9 8.8* 8.8*  PROT  --  6.2*  --   ALBUMIN  --  3.1*  --   AST  --  43*  --   ALT  --  35  --   ALKPHOS  --  53  --   BILITOT  --  2.0*  --   GFRNONAA >60 >60 46*  GFRAA >60 >60 54*  ANIONGAP 11 9 13      Hematology Recent Labs  Lab 12/12/18 0448 12/12/18 1956 12/13/18 0757  WBC 9.1 10.2 13.6*  RBC 3.79* 3.28* 3.00*  HGB 10.5* 9.3* 8.2*  HCT 34.0* 29.2* 26.5*  MCV 89.7 89.0 88.3  MCH 27.7 28.4 27.3  MCHC 30.9 31.8 30.9  RDW 14.5 14.5 14.7  PLT 153 175 164   BNP Recent Labs  Lab 12/09/18 0023  BNP 352.9*    DDimer No results for input(s): DDIMER in the last 168 hours.   Radiology  Dg Chest Port 1 View  Result Date: 12/12/2018 CLINICAL DATA:  Hematoma on left side of head. EXAM: PORTABLE CHEST 1 VIEW COMPARISON:  12/08/2018 FINDINGS: Previous median sternotomy and CABG. Cardiac enlargement. Asymmetric elevation of the right hemidiaphragm, unchanged. Scarring identified within the right base, similar to previous exam. No superimposed pleural effusion, airspace consolidation or atelectasis. IMPRESSION: 1. No acute cardiopulmonary abnormalities. 2. Cardiac enlargement. Electronically Signed   By: Kerby Moors M.D.   On: 12/12/2018 20:06    Cardiac Studies  LHC 02-2018 1. Left dominant circulation 2. Severe 3 vessel occlusive CAD.    - 95% ostial LAD    - 100% first diagonal    - 100% ostial LCx. This is new since 2017 - was 95% prior    - 100% nondominant RCA 3. Patent LIMA to the LAD. There is chronic severe disease in the apical LAD 4. Patent SVG to the first diagonal and SVG to the first OM as a Y graft. 5. Chronic occlusion of the SVG to terminal LCx 6. Normal LVEDP  Patient Profile  [name] is a 73 y.o. male with history of atrial fibrillation, obesity, sleep apnea, CAD status post CABG, diastolic heart failure who was admitted on 9/25 for atrial fibrillation with RVR.  He underwent a cardioversion, but has had return of atrial fibrillation.  Course complicated on 0000000 by chest wall hematoma.  Assessment & Plan   1.  Acute chest wall hematoma, acute blood loss anemia -His heparin has stopped as well as his Coumadin.  He was given protamine by the night  physician. -Hemoglobin is down to 8.2 and was noted to be around 13 on admission -The area does not appear to be enlarging from what has been marked -For now we will continue with trending his CBCs, in transfusing as needed -  I will hold on any imaging at this time -We will trend his CBCs and check them every 8 hours for now -Blood pressure remained stable, but his heart rate is a bit tacky -We will allow for linear rate control given his acute bleed -Transfusion goal will be 7 g/dL -We will maintain a low threshold for transfer to ICU  2.  Atrial fibrillation with RVR -He has had a failed cardioversion attempt this admission -Course now complicated by left chest wall hematoma -Anticoagulation will be held, and has been reversed -He will not be a candidate for cardioversion this admission -We will just plan for rate control for now -I will allow for lenient rate control today as he recovers from his hematoma  3.  CAD status post CABG -Holding aspirin and in setting of acute bleed -Continue statin  4.  Diastolic heart failure -We will repeat an echocardiogram -Appears euvolemic, and will hold his Lasix as his creatinine has bumped -Also will allow for a bit higher volume status given his acute bleed  # FEN -No intravenous fluids -Salt strict diet -DVT prophylaxis: SCDs in setting of acute blood loss anemia -CODE STATUS: Full   For questions or updates, please contact [facility] Please consult [email or web address] for contact info under        Signed, [place] Bells T. Audie Box, [place]  12/13/2018 9:54 AM

## 2018-12-13 NOTE — Care Management Important Message (Signed)
Important Message  Patient Details  Name: [name] MRN: [id number] Date of Birth: 1945/06/19   Medicare Important Message Given:  Yes     Orbie Pyo 12/13/2018, 2:30 PM

## 2018-12-13 NOTE — TOC Initial Note (Signed)
Transition of Care [facility]) - Initial/Assessment Note    Patient Details  Name: [name] MRN: [id number] Date of Birth: Mar 03, 1946  [facility]) CM/SW Contact:    [name], [place] Phone Number: [phone] 12/13/2018, 3:17 PM  Clinical Narrative:                  CSW spoke with patient regarding PT recommendation of Skilled nursing facility for short term rehab. Patient reports being agreeable and acknowledges he needs short term rehab for a few weeks before going home. Patient reports he lives in [place] and is agreeable with referral being sent to [facility], however would like referrals sent to other SNFs as well and then to review bed offers with CSW.   CSW informed patient of insurance coverage for up to 21 days, patient expressed understanding. CSW explained transportation process to SNF via PTAR, patient expressed understanding. CSW offered to call patient's wife and inform of discharge planning, patient reports he will inform his wife. No further questions or concerns at this time.   Expected Discharge Plan: Skilled Nursing Facility Barriers to Discharge: Continued Medical Work up   Patient Goals and CMS Choice Patient states their goals for this hospitalization and ongoing recovery are:: to go to rehab for a few weeks before going home CMS Medicare.gov Compare Post Acute Care list provided to:: Patient Choice offered to / list presented to : Patient  Expected Discharge Plan and Services Expected Discharge Plan: [place] Choice: [place] arrangements for the past 2 months: Single Family Home                                      Prior Living Arrangements/Services Living arrangements for the past 2 months: Single Family Home Lives with:: Spouse Patient language and need for interpreter reviewed:: Yes Do you feel safe going back to the place where you live?: Yes      Need for  Family Participation in Patient Care: Yes (Comment) Care giver support system in place?: Yes (comment)   Criminal Activity/Legal Involvement Pertinent to Current Situation/Hospitalization: No - Comment as needed  Activities of Daily Living Home Assistive Devices/Equipment: Walker (specify type), Eyeglasses ADL Screening (condition at time of admission) Patient's cognitive ability adequate to safely complete daily activities?: Yes Is the patient deaf or have difficulty hearing?: No Does the patient have difficulty seeing, even when wearing glasses/contacts?: No Does the patient have difficulty concentrating, remembering, or making decisions?: No Patient able to express need for assistance with ADLs?: Yes Does the patient have difficulty dressing or bathing?: No Independently performs ADLs?: Yes (appropriate for developmental age) Does the patient have difficulty walking or climbing stairs?: Yes Weakness of Legs: Both Weakness of Arms/Hands: None  Permission Sought/Granted Permission sought to share information with : Case Manager, Customer service manager, Family Supports Permission granted to share information with : Yes, Verbal Permission Granted  Share Information with NAME: [name]  Permission granted to share info w AGENCY: SNFs  Permission granted to share info w Relationship: spouse  Permission granted to share info w Contact Information: [phone]  Emotional Assessment Appearance:: Appears stated age Attitude/Demeanor/Rapport: Gracious Affect (typically observed): Calm Orientation: : Oriented to Self, Oriented to Place, Oriented to  Time, Oriented to Situation Alcohol / Substance Use: Not Applicable Psych Involvement: No (comment)  Admission diagnosis:  Acute on chronic diastolic heart failure (HCC) [I50.33] Elevated troponin [R79.89] Atrial fibrillation with rapid ventricular response (HCC) [I48.91] Anticoagulated on warfarin [Z79.01] Normochromic normocytic  anemia [D64.9] Atrial fibrillation [facility]) [I48.91] Patient Active Problem List   Diagnosis Date Noted  . Acute on chronic diastolic heart failure ([place])   . Anticoagulated on warfarin   . Elevated troponin   . Atrial fibrillation ([place]) 12/09/2018  . Congestive heart failure ([place])   . Abnormal INR   . AKI (acute kidney injury) ([place])   . History of gout   . History of hypothyroidism   . History of type 2 diabetes mellitus   . Atrial fibrillation with rapid ventricular response ([place]) 11/27/2018  . Ischemic chest pain ([place]) 02/17/2018  . Pure hypercholesterolemia 05/01/2017  . Thoracic aorta atherosclerosis ([place]) 04/27/2017  . Stroke (cerebrum) ([place]) 01/13/2017  . RUQ abdominal pain 01/13/2017  . Paroxysmal atrial fibrillation ([place]) 12/06/2016  . Dyspnea on exertion 10/21/2016  . Acute respiratory failure with hypoxia and hypercapnia ([place]) 12/12/2015  . 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 CAD S/P CFX DES 2011 04/25/2015  . Persistent atrial fibrillation 04/25/2015  . Vasovagal syncope   . Constipation 04/24/2015  . Chronic diastolic heart failure ([place]) 04/24/2015  . B12 deficiency 08/11/2013  . Sensory disturbance 08/11/2013  . Ataxia 08/10/2013  . Laceration of ear, external, right 07/05/2013  . Insomnia 05/21/2013  . Hypothyroidism, acquired 02/15/2013  . Weakness 11/17/2012  . Spastic hemiplegia affecting dominant side ([place]) 07/21/2011  . Long term current use of anticoagulant therapy 06/24/2011  . Hypogonadism male 04/10/2011  . Fatigue 03/31/2011  . Allergic rhinitis 02/13/2010  . BENIGN POSITIONAL VERTIGO 11/23/2009  . 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 Hypothyroidism 04/02/2009  . Back pain 01/29/2009  . Type 2 diabetes mellitus without complication, without long-term current use of insulin ([place]) 10/05/2007  . Obstructive sleep apnea-Failed CPAP 06/17/2007  . Hyperlipidemia LDL goal <70 12/12/2006  . GOUT 12/12/2006  .  Essential hypertension 12/12/2006  . History of stroke with residual deficit 12/12/2006  . GERD 12/12/2006   PCP:  Debbrah Alar, NP Pharmacy:   [id number] - [place], [place] [address] Phone: [phone] Fax: [phone]     Social Determinants of Health (SDOH) Interventions    Readmission Risk Interventions No flowsheet data found.

## 2018-12-13 NOTE — NC FL2 (Signed)
[place] LEVEL OF CARE SCREENING TOOL     IDENTIFICATION  Patient Name: [name] Birthdate: 1946-01-27 Sex: male Admission Date (Current Location): 12/08/2018  [facility] and [place] Number:  Herbalist and Address:  [facility] [place], [place] [address] [place], [place] [address]      Provider Number: [id number]  Attending Physician Name and Address:  Geralynn Rile, MD  Relative Name and Phone Number:  Jeryl [place], [phone] & Prudencio Pair, daughter, [phone]    Current Level of Care:   Recommended Level of Care: [place] Prior Approval Number:    Date Approved/Denied: 12/13/18 PASRR Number: [id number]  Discharge Plan: SNF    Current Diagnoses: Patient Active Problem List   Diagnosis Date Noted  . Acute on chronic diastolic heart failure ([place])   . Anticoagulated on warfarin   . Elevated troponin   . Atrial fibrillation ([place]) 12/09/2018  . Congestive heart failure ([place])   . Abnormal INR   . AKI (acute kidney injury) ([place])   . History of gout   . History of hypothyroidism   . History of type 2 diabetes mellitus   . Atrial fibrillation with rapid ventricular response ([place]) 11/27/2018  . Ischemic chest pain ([place]) 02/17/2018  . Pure hypercholesterolemia 05/01/2017  . Thoracic aorta atherosclerosis ([place]) 04/27/2017  . Stroke (cerebrum) ([place]) 01/13/2017  . RUQ abdominal pain 01/13/2017  . Paroxysmal atrial fibrillation ([place]) 12/06/2016  . Dyspnea on exertion 10/21/2016  . Acute respiratory failure with hypoxia and hypercapnia ([place]) 12/12/2015  . Coronary 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 ([place]) 11/04/2015  . CAD S/P CFX DES 2011 04/25/2015  . Persistent atrial fibrillation 04/25/2015  . Vasovagal syncope   . Constipation 04/24/2015  . Chronic diastolic heart failure ([place]) 04/24/2015  . B12 deficiency 08/11/2013  . Sensory disturbance 08/11/2013  .  Ataxia 08/10/2013  . Laceration of ear, external, right 07/05/2013  . Insomnia 05/21/2013  . Hypothyroidism, acquired 02/15/2013  . Weakness 11/17/2012  . Spastic hemiplegia affecting dominant side ([place]) 07/21/2011  . Long term current use of anticoagulant therapy 06/24/2011  . Hypogonadism male 04/10/2011  . Fatigue 03/31/2011  . Allergic rhinitis 02/13/2010  . BENIGN POSITIONAL VERTIGO 11/23/2009  . 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 Hypothyroidism 04/02/2009  . Back pain 01/29/2009  . Type 2 diabetes mellitus without complication, without long-term current use of insulin ([place]) 10/05/2007  . Obstructive sleep apnea-Failed CPAP 06/17/2007  . Hyperlipidemia LDL goal <70 12/12/2006  . GOUT 12/12/2006  . Essential hypertension 12/12/2006  . History of stroke with residual deficit 12/12/2006  . GERD 12/12/2006    Orientation RESPIRATION BLADDER Height & Weight     Self, Time, Situation, Place  O2(99, [place], 2L) Incontinent, External catheter Weight: 237 lb 7 oz (107.7 kg) Height:  6\' 3"  (190.5 cm)  BEHAVIORAL SYMPTOMS/MOOD NEUROLOGICAL BOWEL NUTRITION STATUS      Continent Diet(Heart healthy, thin liquids)  AMBULATORY STATUS COMMUNICATION OF NEEDS Skin   Extensive Assist Verbally Skin abrasions(abrasion on back, hemotoma near axilla (hard to touch), and excoriated skin on arm/leg)                       Personal Care Assistance Level of Assistance  Bathing, Feeding, Dressing, Total care Bathing Assistance: Limited assistance Feeding assistance: Independent Dressing Assistance: Limited assistance Total Care Assistance: Limited assistance   Functional Limitations Info  Sight, Hearing, Speech Sight Info: Impaired(Wears  glasses) Hearing Info: Adequate Speech Info: Adequate    SPECIAL CARE FACTORS FREQUENCY  PT (By licensed PT)     PT Frequency: 5x/wk              Contractures Contractures Info: Not present    Additional Factors Info  Code Status, Insulin Sliding  Scale, Allergies Code Status Info: Full Code Allergies Info: No Known Allergies   Insulin Sliding Scale Info: insulin aspart novolog 0-15 units 3x daily w/meals       Current Medications (12/13/2018):  This is the current hospital active medication list Current Facility-Administered Medications  Medication Dose Route Frequency Provider Last Rate Last Dose  . 0.9 %  sodium chloride infusion  250 mL Intravenous Continuous Ledora Bottcher, PA   Stopped at 12/10/18 1123  . acetaminophen (TYLENOL) tablet 650 mg  650 mg Oral Q4H PRN Croitoru, Mihai, MD   650 mg at 12/12/18 0636  . atorvastatin (LIPITOR) tablet 40 mg  40 mg Oral Daily Croitoru, Mihai, MD   40 mg at 12/13/18 0904  . diltiazem (CARDIZEM CD) 24 hr capsule 120 mg  120 mg Oral BID Geralynn Rile, MD      . insulin aspart (novoLOG) injection 0-15 Units  0-15 Units Subcutaneous TID WC Geralynn Rile, MD   3 Units at 12/13/18 1247  . levothyroxine (SYNTHROID) tablet 112 mcg  112 mcg Oral QAC breakfast Kathyrn Drown D, NP   112 mcg at 12/13/18 0656  . MEDLINE mouth rinse  15 mL Mouth Rinse BID Croitoru, Mihai, MD   15 mL at 12/13/18 0907  . ondansetron (ZOFRAN) injection 4 mg  4 mg Intravenous Q6H PRN Croitoru, Mihai, MD      . senna-docusate (Senokot-S) tablet 1 tablet  1 tablet Oral QHS PRN Consuelo Pandy, PA-C   1 tablet at 12/12/18 1609  . sodium chloride flush (NS) 0.9 % injection 3 mL  3 mL Intravenous Q12H Ledora Bottcher, PA   3 mL at 12/13/18 0907  . sodium chloride flush (NS) 0.9 % injection 3 mL  3 mL Intravenous PRN Ledora Bottcher, PA   3 mL at 12/11/18 2218     Discharge Medications: Please see discharge summary for a list of discharge medications.  Relevant Imaging Results:  Relevant Lab Results:   Additional Information SSN: [id number]  [place], LCSWA

## 2018-12-14 ENCOUNTER — Inpatient Hospital Stay (HOSPITAL_COMMUNITY): Payer: Medicare Other

## 2018-12-14 LAB — CBC
HCT: 23.4 % — ABNORMAL LOW (ref 39.0–52.0)
HCT: 23.4 % — ABNORMAL LOW (ref 39.0–52.0)
Hemoglobin: 7.2 g/dL — ABNORMAL LOW (ref 13.0–17.0)
Hemoglobin: 7.3 g/dL — ABNORMAL LOW (ref 13.0–17.0)
MCH: 27.4 pg (ref 26.0–34.0)
MCH: 27.9 pg (ref 26.0–34.0)
MCHC: 30.8 g/dL (ref 30.0–36.0)
MCHC: 31.2 g/dL (ref 30.0–36.0)
MCV: 89 fL (ref 80.0–100.0)
MCV: 89.3 fL (ref 80.0–100.0)
Platelets: 173 10*3/uL (ref 150–400)
Platelets: 121 10*3/uL — ABNORMAL LOW (ref 150–400)
RBC: 2.63 MIL/uL — ABNORMAL LOW (ref 4.22–5.81)
RBC: 2.62 MIL/uL — ABNORMAL LOW (ref 4.22–5.81)
RDW: 15.2 % (ref 11.5–15.5)
RDW: 15.2 % (ref 11.5–15.5)
WBC: 14.6 10*3/uL — ABNORMAL HIGH (ref 4.0–10.5)
WBC: 16.2 10*3/uL — ABNORMAL HIGH (ref 4.0–10.5)
nRBC: 0 % (ref 0.0–0.2)
nRBC: 0 % (ref 0.0–0.2)

## 2018-12-14 LAB — BASIC METABOLIC PANEL
Anion gap: 12 (ref 5–15)
BUN: 32 mg/dL — ABNORMAL HIGH (ref 8–23)
CO2: 28 mmol/L (ref 22–32)
Calcium: 8.4 mg/dL — ABNORMAL LOW (ref 8.9–10.3)
Chloride: 97 mmol/L — ABNORMAL LOW (ref 98–111)
Creatinine, Ser: 1.49 mg/dL — ABNORMAL HIGH (ref 0.61–1.24)
GFR calc Af Amer: 52 mL/min — ABNORMAL LOW (ref >60)
GFR calc non Af Amer: 45 mL/min — ABNORMAL LOW (ref >60)
Glucose, Bld: 128 mg/dL — ABNORMAL HIGH (ref 70–99)
Potassium: 4.3 mmol/L (ref 3.5–5.1)
Sodium: 137 mmol/L (ref 135–145)

## 2018-12-14 LAB — BLOOD GAS, ARTERIAL
Acid-Base Excess: 6.6 mmol/L — ABNORMAL HIGH (ref 0.0–2.0)
Bicarbonate: 30.7 mmol/L — ABNORMAL HIGH (ref 20.0–28.0)
Drawn by: 331761
O2 Content: 2 L/min
O2 Saturation: 96.1 %
Patient temperature: 98.6
pCO2 arterial: 44.8 mmHg (ref 32.0–48.0)
pH, Arterial: 7.451 — ABNORMAL HIGH (ref 7.350–7.450)
pO2, Arterial: 81.8 mmHg — ABNORMAL LOW (ref 83.0–108.0)

## 2018-12-14 LAB — GLUCOSE, CAPILLARY
Glucose-Capillary: 152 mg/dL — ABNORMAL HIGH (ref 70–99)
Glucose-Capillary: 188 mg/dL — ABNORMAL HIGH (ref 70–99)
Glucose-Capillary: 119 mg/dL — ABNORMAL HIGH (ref 70–99)
Glucose-Capillary: 144 mg/dL — ABNORMAL HIGH (ref 70–99)
Glucose-Capillary: 140 mg/dL — ABNORMAL HIGH (ref 70–99)
Glucose-Capillary: 144 mg/dL — ABNORMAL HIGH (ref 70–99)

## 2018-12-14 LAB — IRON AND TIBC
Iron: 37 ug/dL — ABNORMAL LOW (ref 45–182)
Saturation Ratios: 10 % — ABNORMAL LOW (ref 17.9–39.5)
TIBC: 370 ug/dL (ref 250–450)
UIBC: 333 ug/dL

## 2018-12-14 LAB — PROTIME-INR
INR: 1.6 — ABNORMAL HIGH (ref 0.8–1.2)
Prothrombin Time: 18.8 seconds — ABNORMAL HIGH (ref 11.4–15.2)

## 2018-12-14 MED ORDER — FERROUS SULFATE 325 (65 FE) MG PO TABS
325.0000 mg | ORAL_TABLET | Freq: Two times a day (BID) | ORAL | Status: DC
  Administered 2018-12-14 – 2018-12-16 (×6): 325 mg via ORAL
  Filled 2018-12-14 (×6): qty 1

## 2018-12-14 MED ORDER — FOLIC ACID 1 MG PO TABS
1.0000 mg | ORAL_TABLET | Freq: Every day | ORAL | Status: DC
  Administered 2018-12-14 – 2018-12-16 (×3): 1 mg via ORAL
  Filled 2018-12-14 (×3): qty 1

## 2018-12-14 MED ORDER — ZOLPIDEM TARTRATE 5 MG PO TABS
5.0000 mg | ORAL_TABLET | Freq: Every evening | ORAL | Status: DC | PRN
  Administered 2018-12-14 – 2018-12-15 (×3): 5 mg via ORAL
  Filled 2018-12-14 (×4): qty 1

## 2018-12-14 MED ORDER — DILTIAZEM HCL ER COATED BEADS 240 MG PO CP24
240.0000 mg | ORAL_CAPSULE | Freq: Two times a day (BID) | ORAL | Status: DC
  Administered 2018-12-14 – 2018-12-16 (×5): 240 mg via ORAL
  Filled 2018-12-14 (×5): qty 1

## 2018-12-14 NOTE — Consult Note (Signed)
   [facility] CM Inpatient Consult   12/14/2018  [name] Oct 17, 1945 [id number]    Patientwas screenedforpotentialneedsofTHN Care Management services under his Medicare/ [employer] plan with 22% highrisk scorefor unplanned readmission with 2 hospitalizations in the past 6 months and a 30 day readmission.  Per chart reviewand MD note today reveal as follows: [name] a 73 y.o.malewith history of atrial fibrillation, obesity, sleep apnea, CAD status post CABG, diastolic heart failure,          who was admitted on 9/25 for atrial fibrillation with RVR. He underwent a cardioversion, but has had return of atrial fibrillation. Course complicated on 0000000 by chest wall hematoma (acute blood loss anemia, secondary to anticoagulation). The patient has struggled with control of his atrial fibrillation.  He was recently hospitalized for A. fib with RVR and was discharged home on diltiazem CD at 180 mg daily.  Review of PT notes show that patient was recommended for SNF (skilled nursing facility) for short term rehab prior to returning home, however,considering his progress from yesterday to today, [facility]) was more appropriate for post-acute rehab to maximize independence and safety with mobility and ADLs. Rehab admissions coordinator is aware and consult made.  Primary care provider isMelissa O'Sullivan with [place] [facility], listed to provide transition of care follow-up.  Will follow fordisposition/ needs,if there are changes with patient'spost hospitalneeds, please place a [place] Management consultfor community follow-up as appropriate.  Of note, [facility] Care Management services does not replace or interfere with any services that are arranged bytransition of carecase management or social work.  Plan: If patient will transition to a facility covered by [facility], will notify [facility] post acute coordinator for post discharge follow-up of needs.     For questions and referral, please contact:  Khira Cudmore A. Roxy Filler, BSN, RN-BC [facility] Liaison Cell: [phone]

## 2018-12-14 NOTE — TOC Progression Note (Signed)
Transition of Care [facility]) - Progression Note    Patient Details  Name: [name] MRN: [id number] Date of Birth: 1946-01-25  Transition [facility] Care [facility]) CM/SW [place], [place] Phone Number: [phone] 12/14/2018, 2:33 PM  Clinical Narrative:     CSW attempted to call patient's spouse to inform of CIR rec if accepting of this. She reports she does not do well on the cell phone and would rather face time to lip read, CSW attempted to facetime her back, no answer. CSW attempted to call her back, no answer.   CSW has lvm for patient's daughter to update on recommendation as well.   Expected Discharge Plan: [place] Barriers to Discharge: Continued Medical Work up  Expected Discharge Plan and Services Expected Discharge Plan: [place] Choice: [place] arrangements for the past 2 months: Single Family Home                                       Social Determinants of Health (SDOH) Interventions    Readmission Risk Interventions No flowsheet data found.

## 2018-12-14 NOTE — Progress Notes (Signed)
Cardiology Progress Note  Patient ID: [name] MRN: [id number] DOB: [birth year] Date of Encounter: 12/14/2018  Primary Cardiologist: Candee Furbish, MD  Subjective  Decline in hemoglobin has slowed.  Most recent hemoglobin 7.2.  He reports he feels better today.  Telemetry still shows atrial fibrillation in the low 100 range.  ROS:  All other ROS reviewed and negative. Pertinent positives noted in the HPI.     Inpatient Medications  Scheduled Meds: . atorvastatin  40 mg Oral Daily  . diltiazem  240 mg Oral BID  . insulin aspart  0-15 Units Subcutaneous TID WC  . levothyroxine  112 mcg Oral QAC breakfast  . mouth rinse  15 mL Mouth Rinse BID  . sodium chloride flush  3 mL Intravenous Q12H   Continuous Infusions: . sodium chloride Stopped (12/10/18 1123)   PRN Meds: acetaminophen, ondansetron (ZOFRAN) IV, senna-docusate, sodium chloride flush, zolpidem   Vital Signs   Vitals:   12/13/18 0905 12/13/18 1203 12/13/18 2208 12/14/18 0658  BP: 103/69 122/82 117/79 108/70  Pulse: (!) 121 (!) 116 (!) 105 88  Resp:   18 17  Temp: (!) 97.5 F (36.4 C) 98.2 F (36.8 C) 98.4 F (36.9 C) 98.5 F (36.9 C)  TempSrc: Oral Oral Oral Oral  SpO2: 99% 98% 98% 100%  Weight:      Height:        Intake/Output Summary (Last 24 hours) at 12/14/2018 0858 Last data filed at 12/13/2018 1934 Gross per 24 hour  Intake 120 ml  Output 700 ml  Net -580 ml   Last 3 Weights 12/13/2018 12/10/2018 12/09/2018  Weight (lbs) 237 lb 7 oz 240 lb 15.4 oz 238 lb 8 oz  Weight (kg) 107.7 kg 109.3 kg 108.183 kg      Telemetry  Overnight telemetry shows atrial fibrillation with heart rate in the 100-110 range, which I personally reviewed.   Physical Exam   Vitals:   12/13/18 0905 12/13/18 1203 12/13/18 2208 12/14/18 0658  BP: 103/69 122/82 117/79 108/70  Pulse: (!) 121 (!) 116 (!) 105 88  Resp:   18 17  Temp: (!) 97.5 F (36.4 C) 98.2 F (36.8 C) 98.4 F (36.9 C) 98.5 F (36.9 C)  TempSrc: Oral  Oral Oral Oral  SpO2: 99% 98% 98% 100%  Weight:      Height:         Intake/Output Summary (Last 24 hours) at 12/14/2018 0858 Last data filed at 12/13/2018 1934 Gross per 24 hour  Intake 120 ml  Output 700 ml  Net -580 ml    Last 3 Weights 12/13/2018 12/10/2018 12/09/2018  Weight (lbs) 237 lb 7 oz 240 lb 15.4 oz 238 lb 8 oz  Weight (kg) 107.7 kg 109.3 kg 108.183 kg    Body mass index is 29.68 kg/m.  General: Well nourished, well developed, in no acute distress Head: Atraumatic, normal size  Eyes: PEERLA, EOMI  Neck: Supple, no JVD Endocrine: No thryomegaly Cardiac: Irregular rhythm, no murmurs rubs or gallops Lungs: Diminished breath sounds at the bases Abd: Soft, nontender, no hepatomegaly  Ext: No edema, pulses 2+ Musculoskeletal: No deformities, BUE and BLE strength normal and equal Skin: Extensive hematoma in the left axilla region, extending into the left pectoral region as well as left upper back, per tracings yesterday by nursing this does not appear to be expanding Neuro: Alert and oriented to person, place, time, and situation, CNII-XII grossly intact, no focal deficits  Psych: Normal mood and affect  Labs  High Sensitivity Troponin:   Recent Labs  Lab 11/27/18 1527 11/27/18 1750 12/08/18 1518 12/08/18 2029 12/09/18 0024  TROPONINIHS 47* 49* 24* 30* 29*     Cardiac EnzymesNo results for input(s): TROPONINI in the last 168 hours. No results for input(s): TROPIPOC in the last 168 hours.  Chemistry Recent Labs  Lab 12/10/18 0401 12/12/18 0448 12/12/18 1956  NA 139 140 139  K 3.5 3.8 3.7  CL 99 100 99  CO2 29 31 27   GLUCOSE 131* 129* 223*  BUN 15 10 16   CREATININE 0.93 1.01 1.46*  CALCIUM 8.9 8.8* 8.8*  PROT  --  6.2*  --   ALBUMIN  --  3.1*  --   AST  --  43*  --   ALT  --  35  --   ALKPHOS  --  53  --   BILITOT  --  2.0*  --   GFRNONAA >60 >60 46*  GFRAA >60 >60 54*  ANIONGAP 11 9 13     Hematology Recent Labs  Lab 12/13/18 1409 12/13/18 2027  12/14/18 0506  WBC 14.5* 15.0* 14.6*  RBC 2.88* 2.72* 2.63*  HGB 8.2* 7.5* 7.2*  HCT 25.1* 24.1* 23.4*  MCV 87.2 88.6 89.0  MCH 28.5 27.6 27.4  MCHC 32.7 31.1 30.8  RDW 14.6 14.9 15.2  PLT 170 175 173   BNP Recent Labs  Lab 12/09/18 0023  BNP 352.9*    DDimer No results for input(s): DDIMER in the last 168 hours.   Radiology  Dg Chest Port 1 View  Result Date: 12/12/2018 CLINICAL DATA:  Hematoma on left side of head. EXAM: PORTABLE CHEST 1 VIEW COMPARISON:  12/08/2018 FINDINGS: Previous median sternotomy and CABG. Cardiac enlargement. Asymmetric elevation of the right hemidiaphragm, unchanged. Scarring identified within the right base, similar to previous exam. No superimposed pleural effusion, airspace consolidation or atelectasis. IMPRESSION: 1. No acute cardiopulmonary abnormalities. 2. Cardiac enlargement. Electronically Signed   By: Kerby Moors M.D.   On: 12/12/2018 20:06   Patient Profile  [name] is a 73 y.o. male with history of atrial fibrillation, obesity, sleep apnea, CAD status post CABG, diastolic heart failure who was admitted on 9/25 for atrial fibrillation with RVR.  He underwent a cardioversion, but has had return of atrial fibrillation.  Course complicated on 0000000 by chest wall hematoma.  Assessment & Plan   1.  Acute chest wall hematoma, acute blood loss anemia, secondary to anticoagulation -Hemoglobin drop has slowed and most recently 7.2 -We will back off on CBC checks to daily -I will check an iron profile on him -We will start iron supplementation twice daily with folate -Hemodynamically stable and the hematoma does not appear to be expanding on my examination -Transfusion goal 7  2.  Atrial fibrillation with RVR -He will be unable to be cardioverted this admission due to the bleed as described above -We will increase his diltiazem to 240 mg twice daily for better rate control -He has had multiple attempted cardioversion, and I suspect he  will just be a rate control patient -I would be reluctant to restart any anticoagulation this patient has had a spontaneous chest wall hematoma  3.  CAD status post CABG -We will continue to hold his aspirin and this can be readdressed in the outpatient setting -Continue statin  4.  Diastolic heart failure -Euvolemic on exam today -Repeat echo pending  # FEN -No intravenous fluids -Salt strict diet -DVT prophylaxis: SCDs in setting of  acute blood loss anemia -CODE STATUS: Full -Disposition: We will have PT OT assessment for his likely acute rehab needs upon discharge today    For questions or updates, please contact [place] Please consult [email or web address] for contact info under        Signed, Lake Bells T. Audie Box, [place]  12/14/2018 8:58 AM

## 2018-12-14 NOTE — Progress Notes (Signed)
Rehab Admissions Coordinator Note:  Patient was screened by Cleatrice Burke for appropriateness for an Inpatient Acute Rehab Consult per PT recs.   At this time, we are recommending Inpatient Rehab consult if pt would like to be considered for admit. Please advise.  Cleatrice Burke RN MSN 12/14/2018, 11:04 AM  I can be reached at [phone].

## 2018-12-14 NOTE — Plan of Care (Signed)
  Problem: Activity: Goal: Ability to tolerate increased activity will improve Outcome: Progressing   Problem: Cardiac: Goal: Ability to achieve and maintain adequate cardiopulmonary perfusion will improve Outcome: Progressing   Problem: Health Behavior/Discharge Planning: Goal: Ability to safely manage health-related needs after discharge will improve Outcome: Progressing   Problem: Education: Goal: Knowledge of General Education information will improve Description: Including pain rating scale, medication(s)/side effects and non-pharmacologic comfort measures Outcome: Progressing   Problem: Clinical Measurements: Goal: Ability to maintain clinical measurements within normal limits will improve Outcome: Progressing   Problem: Activity: Goal: Risk for activity intolerance will decrease Outcome: Progressing

## 2018-12-14 NOTE — Significant Event (Addendum)
Rapid Response Event Note  Overview:  Follow up     Initial Focused Assessment: Per staff patient has had an active morning working with PT.  He has recently had a mental status changed after being up. He opens his eyes quickly and easily, but answers questions slowly and drifts off mid sentence.   Labored breathing, decreased base BP 133/79  AF 113  RR 30  Dr Audie Box at bedside  Interventions: ABG CBC, Bmet Recommended PCXR and head CT  1600 Patient much improved, alert and interactive.    Plan of Care (if not transferred): Increase VS frequency Call if critical results or if patient worsens  Event Summary:   at      at          Raliegh Ip

## 2018-12-14 NOTE — Evaluation (Signed)
Physical Therapy Re- Evaluation Patient Details Name: [name] MRN: [id number] DOB: 12/23/1945 Today's Date: 12/14/2018   History of Present Illness  Pt is 73 yo male with onset of a-fib was admitted for cardioversion, done 9/25 and referred to PT.  Pt was lethargic and had difficulty with hematoma forming.  Order for PT was discontinued after PT saw him on 9/28; Reorder received 9/29;  PMHx:  CABG, PAF, encephalopathy, respiratory failure, OSA, DM, gout, HTN  Clinical Impression   Pt admitted with above diagnosis. Noted he was managing independently, no assistive device within a month of this admission; Presents to PT with generalized weakness, decr functional mobility; He tells me that he has 24 hour assist available to him at home, and that he would like to rehab "on the 4th floor"; Considering his progress from yesterday to today, I heartily agree with cIR for post-acute rehab to maximize independence and safety with mobility and ADLs; Will place CIR Admission screen;   Pt currently with functional limitations due to the deficits listed below (see PT Problem List). Pt will benefit from skilled PT to increase their independence and safety with mobility to allow discharge to the venue listed below.       Follow Up Recommendations CIR    Equipment Recommendations  Rolling walker with 5" wheels;3in1 (PT)    Recommendations for Other Services Rehab consult     Precautions / Restrictions Precautions Precautions: Fall Restrictions Weight Bearing Restrictions: No      Mobility  Bed Mobility Overal bed mobility: Needs Assistance Bed Mobility: Supine to Sit     Supine to sit: Max assist     General bed mobility comments: Max assist to initiate moving LEs off of bed; helicpoter technqiue due to L UE/shoulder pain; Max assist to elevate trunk to sit once LEs cleared  Transfers Overall transfer level: Needs assistance Equipment used: 2 person hand held assist(bil support at  gait belt) Transfers: Sit to/from [facility] Sit to Stand: Mod assist;+2 safety/equipment Stand pivot transfers: Mod assist;+2 physical assistance       General transfer comment: Heavy moderate assist to power up; close guard of knees for safety, but no buckling noted; Heavy mod assist to steady for pivot steps bed to recliner, and then recliner back to bed (as pt was actively moving bowels)  Ambulation/Gait             General Gait Details: pivot steps bed to chair  Stairs            Wheelchair Mobility    Modified Rankin (Stroke Patients Only)       Balance Overall balance assessment: Needs assistance Sitting-balance support: Feet supported;Bilateral upper extremity supported Sitting balance-Leahy Scale: Poor(very close to Fair) Sitting balance - Comments: mod to max assist given initially for sitting balance, progressed to close guard      Standing balance-Leahy Scale: Poor                               Pertinent Vitals/Pain Pain Assessment: Faces Faces Pain Scale: Hurts little more Pain Location: L prox UE/shoulder with motion Pain Descriptors / Indicators: Grimacing;Guarding Pain Intervention(s): Monitored during session;Repositioned    Home Living Family/patient expects to be discharged to:: Private residence Living Arrangements: Spouse/significant other(pt tells me he has 24 hour support) Available Help at Discharge: Family Type of Home: House Home Access: Stairs to enter Entrance Stairs-Rails: Left Entrance Stairs-Number of Steps:  3-4 Home Layout: One level   Additional Comments: pt reports a fully accessible home    Prior Function Level of Independence: Independent         Comments: typically active, drove and able to walk without AD until 2 weeks ago     Hand Dominance   Dominant Hand: Left    Extremity/Trunk Assessment   Upper Extremity Assessment Upper Extremity Assessment: Generalized  weakness(LUE/shoulder pain with motion)    Lower Extremity Assessment Lower Extremity Assessment: Generalized weakness(Slow to move)       Communication   Communication: No difficulties  Cognition Arousal/Alertness: Awake/alert Behavior During Therapy: WFL for tasks assessed/performed Overall Cognitive Status: No family/caregiver present to determine baseline cognitive functioning                                 General Comments: Eyes closed approx 50% of session; initially slow to answer questions (while supine), but more interacive and participating once sitting upright      General Comments General comments (skin integrity, edema, etc.): Noted L flank hematoma; placed gait belt under hematoma    Exercises     Assessment/Plan    PT Assessment Patient needs continued PT services  PT Problem List Decreased strength;Decreased range of motion;Decreased activity tolerance;Decreased balance;Decreased mobility;Decreased coordination;Decreased cognition;Decreased knowledge of use of DME;Decreased safety awareness;Cardiopulmonary status limiting activity;Decreased skin integrity;Pain       PT Treatment Interventions DME instruction;Gait training;Functional mobility training;Therapeutic activities;Therapeutic exercise;Balance training;Neuromuscular re-education;Cognitive remediation;Patient/family education    PT Goals (Current goals can be found in the Care Plan section)  Acute Rehab PT Goals Patient Stated Goal: Verbalized hopes to rehab at [facility] PT Goal Formulation: With patient Time For Goal Achievement: 12/28/18 Potential to Achieve Goals: Good    Frequency Min 3X/week   Barriers to discharge        Co-evaluation               AM-PAC PT "6 Clicks" Mobility  Outcome Measure Help needed turning from your back to your side while in a flat bed without using bedrails?: A Lot Help needed moving from lying on your back to sitting on the side of a flat bed  without using bedrails?: A Lot Help needed moving to and from a bed to a chair (including a wheelchair)?: A Lot Help needed standing up from a chair using your arms (e.g., wheelchair or bedside chair)?: A Lot Help needed to walk in hospital room?: A Lot Help needed climbing 3-5 steps with a railing? : Total 6 Click Score: 11    End of Session Equipment Utilized During Treatment: Gait belt Activity Tolerance: Patient tolerated treatment well Patient left: in bed;with call bell/phone within reach;with nursing/sitter in room Nurse Communication: Mobility status PT Visit Diagnosis: Difficulty in walking, not elsewhere classified (R26.2)    Time: 0940-1010 PT Time Calculation (min) (ACUTE ONLY): 30 min   Charges:   PT Evaluation $PT Re-evaluation: 1 Re-eval PT Treatments $Therapeutic Activity: 8-22 mins        Roney Marion, PT  [place] Pager [phone] Office [phone]   [place] 12/14/2018, 10:32 AM

## 2018-12-14 NOTE — Progress Notes (Signed)
Pt's hgb=7.2, Dr. Audie Box notified, awaiting call back.

## 2018-12-14 NOTE — Progress Notes (Signed)
Patient seen with change in mental status per nursing students and instructor. Patient seen sleeping and hard to arouse. Vitals WNL at the time and CBG normal. He is with labored breathing. Hematomas unchanged; except slight increase in size of the left hematoma. MD called and updated regarding situation and came to bedside. New orders placed including ABG, CXR, and head CT. Primary RN and Rapid Response RN  also aware and at bedside.

## 2018-12-15 LAB — BASIC METABOLIC PANEL
Anion gap: 10 (ref 5–15)
BUN: 27 mg/dL — ABNORMAL HIGH (ref 8–23)
CO2: 29 mmol/L (ref 22–32)
Calcium: 8.1 mg/dL — ABNORMAL LOW (ref 8.9–10.3)
Chloride: 96 mmol/L — ABNORMAL LOW (ref 98–111)
Creatinine, Ser: 1.22 mg/dL (ref 0.61–1.24)
GFR calc Af Amer: >60 mL/min (ref >60)
GFR calc non Af Amer: 58 mL/min — ABNORMAL LOW (ref >60)
Glucose, Bld: 120 mg/dL — ABNORMAL HIGH (ref 70–99)
Potassium: 3.7 mmol/L (ref 3.5–5.1)
Sodium: 135 mmol/L (ref 135–145)

## 2018-12-15 LAB — CBC
HCT: 21.5 % — ABNORMAL LOW (ref 39.0–52.0)
HCT: 26.5 % — ABNORMAL LOW (ref 39.0–52.0)
Hemoglobin: 6.8 g/dL — CL (ref 13.0–17.0)
Hemoglobin: 8.9 g/dL — ABNORMAL LOW (ref 13.0–17.0)
MCH: 28.1 pg (ref 26.0–34.0)
MCH: 30 pg (ref 26.0–34.0)
MCHC: 31.6 g/dL (ref 30.0–36.0)
MCHC: 33.6 g/dL (ref 30.0–36.0)
MCV: 88.8 fL (ref 80.0–100.0)
MCV: 89.2 fL (ref 80.0–100.0)
Platelets: 167 10*3/uL (ref 150–400)
Platelets: 174 10*3/uL (ref 150–400)
RBC: 2.42 MIL/uL — ABNORMAL LOW (ref 4.22–5.81)
RBC: 2.97 MIL/uL — ABNORMAL LOW (ref 4.22–5.81)
RDW: 15.2 % (ref 11.5–15.5)
RDW: 14.6 % (ref 11.5–15.5)
WBC: 11.9 10*3/uL — ABNORMAL HIGH (ref 4.0–10.5)
WBC: 10.1 10*3/uL (ref 4.0–10.5)
nRBC: 0 % (ref 0.0–0.2)
nRBC: 0.4 % — ABNORMAL HIGH (ref 0.0–0.2)

## 2018-12-15 LAB — GLUCOSE, CAPILLARY
Glucose-Capillary: 115 mg/dL — ABNORMAL HIGH (ref 70–99)
Glucose-Capillary: 160 mg/dL — ABNORMAL HIGH (ref 70–99)
Glucose-Capillary: 142 mg/dL — ABNORMAL HIGH (ref 70–99)
Glucose-Capillary: 115 mg/dL — ABNORMAL HIGH (ref 70–99)

## 2018-12-15 LAB — PROTIME-INR
INR: 1.5 — ABNORMAL HIGH (ref 0.8–1.2)
Prothrombin Time: 17.8 seconds — ABNORMAL HIGH (ref 11.4–15.2)

## 2018-12-15 LAB — PREPARE RBC (CROSSMATCH)

## 2018-12-15 MED ORDER — SODIUM CHLORIDE 0.9% IV SOLUTION: Freq: Once | INTRAVENOUS | Status: DC

## 2018-12-15 NOTE — Progress Notes (Signed)
Inpatient Rehab Admissions:  Inpatient Rehab Consult received.  Attempted to meet with pt x2.  Pt on a phone call and not able to discuss rehab.  Will f/u tomorrow.    Shann Medal, PT, DPT Admissions Coordinator [phone] 12/15/18  4:06 PM

## 2018-12-15 NOTE — Progress Notes (Signed)
Cardiology Progress Note  Patient ID: [name] MRN: [id number] DOB: 1945-09-10 Date of Encounter: 12/15/2018  Primary Cardiologist: Candee Furbish, MD  Subjective  Hemoglobin down to 6.8 today from 7.2 yesterday.  He reports he still feels tired and fatigued.  Telemetry shows atrial fibrillation with heart rate in the 90-110 range.  Physical therapy has recommended inpatient rehab. He was confused yesterday, but ABG and chest x-ray were unremarkable.  A CT head demonstrated no acute intracranial findings.  ROS:  All other ROS reviewed and negative. Pertinent positives noted in the HPI.     Inpatient Medications  Scheduled Meds:  sodium chloride   Intravenous Once   atorvastatin  40 mg Oral Daily   diltiazem  240 mg Oral BID   ferrous sulfate  325 mg Oral BID WC   folic acid  1 mg Oral Daily   insulin aspart  0-15 Units Subcutaneous TID WC   levothyroxine  112 mcg Oral QAC breakfast   mouth rinse  15 mL Mouth Rinse BID   sodium chloride flush  3 mL Intravenous Q12H   Continuous Infusions:  sodium chloride Stopped (12/10/18 1123)   PRN Meds: acetaminophen, ondansetron (ZOFRAN) IV, senna-docusate, sodium chloride flush, zolpidem   Vital Signs   Vitals:   12/14/18 2049 12/15/18 0051 12/15/18 0530 12/15/18 0552  BP: 111/66 108/79  (!) 96/58  Pulse: (!) 107 (!) 110  95  Resp: 20 20  20   Temp: 99.9 F (37.7 C) 98.2 F (36.8 C)  98.3 F (36.8 C)  TempSrc: Oral Oral  Oral  SpO2: 100% 96%  96%  Weight:   111.7 kg   Height:        Intake/Output Summary (Last 24 hours) at 12/15/2018 0954 Last data filed at 12/15/2018 [id number] Gross per 24 hour  Intake 480 ml  Output 1250 ml  Net -770 ml   Last 3 Weights 12/15/2018 12/13/2018 12/10/2018  Weight (lbs) 246 lb 4.1 oz 237 lb 7 oz 240 lb 15.4 oz  Weight (kg) 111.7 kg 107.7 kg 109.3 kg      Telemetry  Overnight telemetry shows atrial fibrillation with heart rate in the 90-110 range, which I personally reviewed.    Physical Exam   Vitals:   12/14/18 2049 12/15/18 0051 12/15/18 0530 12/15/18 0552  BP: 111/66 108/79  (!) 96/58  Pulse: (!) 107 (!) 110  95  Resp: 20 20  20   Temp: 99.9 F (37.7 C) 98.2 F (36.8 C)  98.3 F (36.8 C)  TempSrc: Oral Oral  Oral  SpO2: 100% 96%  96%  Weight:   111.7 kg   Height:         Intake/Output Summary (Last 24 hours) at 12/15/2018 0954 Last data filed at 12/15/2018 [id number] Gross per 24 hour  Intake 480 ml  Output 1250 ml  Net -770 ml    Last 3 Weights 12/15/2018 12/13/2018 12/10/2018  Weight (lbs) 246 lb 4.1 oz 237 lb 7 oz 240 lb 15.4 oz  Weight (kg) 111.7 kg 107.7 kg 109.3 kg    Body mass index is 30.78 kg/m.  General: Well nourished, well developed, in no acute distress Head: Atraumatic, normal size  Eyes: PEERLA, EOMI  Neck: Supple, no JVD Endocrine: No thryomegaly Cardiac: Irregular rhythm, no murmurs Lungs: Crackles noted at lung bases Abd: Soft, nontender, no hepatomegaly  Ext: No edema, pulses 2+ Musculoskeletal: No deformities, BUE and BLE strength normal and equal Skin: Extensive left axillary hematoma, there appears to be a bit  of worsening of the region I suspect down near the buttocks due to gravity, small hematomas noted on the right axilla Neuro: Alert and oriented to person, place, time Psych: Normal mood and affect   Labs  High Sensitivity Troponin:   Recent Labs  Lab 11/27/18 1527 11/27/18 1750 12/08/18 1518 12/08/18 2029 12/09/18 0024  TROPONINIHS 47* 49* 24* 30* 29*     Cardiac EnzymesNo results for input(s): TROPONINI in the last 168 hours. No results for input(s): TROPIPOC in the last 168 hours.  Chemistry Recent Labs  Lab 12/12/18 0448 12/12/18 1956 12/14/18 1229 12/15/18 0424  NA 140 139 137 135  K 3.8 3.7 4.3 3.7  CL 100 99 97* 96*  CO2 31 27 28 29   GLUCOSE 129* 223* 128* 120*  BUN 10 16 32* 27*  CREATININE 1.01 1.46* 1.49* 1.22  CALCIUM 8.8* 8.8* 8.4* 8.1*  PROT 6.2*  --   --   --   ALBUMIN 3.1*  --   --    --   AST 43*  --   --   --   ALT 35  --   --   --   ALKPHOS 53  --   --   --   BILITOT 2.0*  --   --   --   GFRNONAA >60 46* 45* 58*  GFRAA >60 54* 52* >60  ANIONGAP 9 13 12 10     Hematology Recent Labs  Lab 12/14/18 0506 12/14/18 1229 12/15/18 0424  WBC 14.6* 16.2* 11.9*  RBC 2.63* 2.62* 2.42*  HGB 7.2* 7.3* 6.8*  HCT 23.4* 23.4* 21.5*  MCV 89.0 89.3 88.8  MCH 27.4 27.9 28.1  MCHC 30.8 31.2 31.6  RDW 15.2 15.2 15.2  PLT 173 121* 167   BNP Recent Labs  Lab 12/09/18 0023  BNP 352.9*    DDimer No results for input(s): DDIMER in the last 168 hours.   Radiology  Ct Head Wo Contrast  Result Date: 12/14/2018 CLINICAL DATA:  Altered mental status EXAM: CT HEAD WITHOUT CONTRAST TECHNIQUE: Contiguous axial images were obtained from the base of the skull through the vertex without intravenous contrast. COMPARISON:  04/06/2017 FINDINGS: Brain: Mild atrophic and chronic white matter ischemic changes are identified. No findings to suggest acute hemorrhage, acute infarction or space-occupying mass lesion are noted. Vascular: No hyperdense vessel or unexpected calcification. Skull: Normal. Negative for fracture or focal lesion. Sinuses/Orbits: No acute finding. Other: None. IMPRESSION: Chronic atrophic and ischemic changes without acute abnormality. Electronically Signed   By: Inez Catalina M.D.   On: 12/14/2018 17:09   Dg Chest Port 1 View  Result Date: 12/14/2018 CLINICAL DATA:  Shortness of breath EXAM: PORTABLE CHEST 1 VIEW COMPARISON:  12/12/2018 FINDINGS: Previous median sternotomy and CABG as before. Cardiomegaly and atherosclerosis, unchanged. Persistent elevation of right hemidiaphragm. Scarring and/or atelectasis at the right base is unchanged. IMPRESSION: Stable elevation of right hemidiaphragm following median sternotomy with juxta diaphragmatic atelectasis and scarring. No acute finding. Electronically Signed   By: Zetta Bills M.D.   On: 12/14/2018 13:30   Patient Profile   [place] a 73 y.o.malewith history of atrial fibrillation, obesity, sleep apnea, CAD status post CABG, diastolic heart failure who was admitted on 9/25 for atrial fibrillation with RVR. He underwent a cardioversion, but has had return of atrial fibrillation. Course complicated on 0000000 by chest wall hematoma.  Assessment & Plan   1.  Altered mental status noted on 9/29 -CT head negative, ABG normal, chest x-ray  without acute finding.  His mentation appears to wax and wane, this is likely delirium from being in the hospital, and having an acute bleed. -We will continue to monitor this for now, he appears better today  2.  Acute chest wall hematoma, acute blood loss anemia, secondary to anticoagulation -Hemoglobin down to 6.8 from 7.2 yesterday.  I think this is related to phlebotomy, he does not appear to have worsening of his bleeding. -I will transfuse him 2 units of packed red blood cells today -We will continue iron supplementation  3.  Atrial fibrillation with RVR -We will not attempt another cardioversion -We will continue rate control with Cardizem 240 mg twice daily -No anticoagulation due to acute bleed  4.  CAD status post CABG -We will continue to hold his aspirin and this can be readdressed in the outpatient setting -Continue statin  5.  Diastolic heart failure -Euvolemic on exam today -Repeat echo pending  # FEN -No intravenous fluids -Salt strict diet -DVT prophylaxis: SCDs in setting of acute blood loss anemia -CODE STATUS: Full -Disposition:  He will have an evaluation by inpatient rehabilitation as recommended by physical therapy   For questions or updates, please contact [facility] Please consult [email or web address] for contact info under        Signed, Lake Bells T. Audie Box, [place]  12/15/2018 9:54 AM

## 2018-12-15 NOTE — Progress Notes (Signed)
Occupational Therapy Evaluation Patient Details Name: [name] MRN: [id number] DOB: 09/21/1945 Today's Date: 12/15/2018    History of Present Illness Pt is 73 yo male with onset of a-fib was admitted for cardioversion, done 9/25 and referred to PT.  Pt was lethargic and had difficulty with hematoma forming.  Order for PT was discontinued after PT saw him.  PMHx:  CABG, PAF, encephalopathy, respiratory failure, OSA, DM, gout, HTN   Clinical Impression   PTA, pt was living at home with family, who assisted with ADL/IADL prn and and pt was independent without assistive device. Pt currently requires modA+2 for functional mobility at RW level, modA+2 for simulated toilet transfer and LB dressing and setupA for grooming and UB ADL. Due to decline in current level of function, pt would benefit from acute OT to address established goals to facilitate safe D/C to venue listed below. At this time, recommend CIR follow-up. Will continue to follow acutely.     Follow Up Recommendations  CIR    Equipment Recommendations  3 in 1 bedside commode    Recommendations for Other Services       Precautions / Restrictions Precautions Precautions: Fall Restrictions Weight Bearing Restrictions: No      Mobility Bed Mobility Overal bed mobility: Needs Assistance Bed Mobility: Supine to Sit     Supine to sit: Mod assist     General bed mobility comments: modA to progress trunk upright  Transfers Overall transfer level: Needs assistance Equipment used: 2 person hand held assist;Rolling walker (2 wheeled)(bil support at gait belt) Transfers: Sit to/from [facility] Sit to Stand: Mod assist;+2 safety/equipment;+2 physical assistance Stand pivot transfers: Mod assist;+2 physical assistance;+2 safety/equipment       General transfer comment: moderate assist to power up;modA for stand-pivot;sit<>stand x4     Balance Overall balance assessment: Needs  assistance Sitting-balance support: Feet supported;Bilateral upper extremity supported Sitting balance-Leahy Scale: Poor     Standing balance support: Bilateral upper extremity supported Standing balance-Leahy Scale: Poor Standing balance comment: heavy reliance on BUE                           ADL either performed or assessed with clinical judgement   ADL Overall ADL's : Needs assistance/impaired Eating/Feeding: Set up;Sitting   Grooming: Set up;Sitting   Upper Body Bathing: Set up;Sitting   Lower Body Bathing: Moderate assistance;+2 for physical assistance;Sit to/from stand   Upper Body Dressing : Set up;Sitting   Lower Body Dressing: Moderate assistance;+2 for physical assistance;Sit to/from stand Lower Body Dressing Details (indicate cue type and reason): assist to don slippers  Toilet Transfer: Moderate assistance;+2 for physical assistance;Stand-pivot;RW Toilet Transfer Details (indicate cue type and reason): simulated from EOB to recliner Toileting- Clothing Manipulation and Hygiene: Total assistance Toileting - Clothing Manipulation Details (indicate cue type and reason): heavy reliance on BUE while standing pt would require assist for posterior pericare     Functional mobility during ADLs: Moderate assistance;Rolling walker;+2 for physical assistance;+2 for safety/equipment General ADL Comments: pt limited by decreased activity tolerance, weakness, and wob;unable to get clear o2 reading, pt reports minor dizziness while sitting EOB, increased wob with mobiltiy;pt required maxA for pursed lip breathing, pt on 2lnc     Vision Baseline Vision/History: Wears glasses Wears Glasses: At all times Patient Visual Report: No change from baseline       Perception     Praxis      Pertinent Vitals/Pain Pain Assessment: Faces  Faces Pain Scale: Hurts little more Pain Location: L prox UE/shoulder with motion Pain Descriptors / Indicators: Grimacing;Guarding Pain  Intervention(s): Limited activity within patient's tolerance;Monitored during session     Hand Dominance Left   Extremity/Trunk Assessment Upper Extremity Assessment Upper Extremity Assessment: Generalized weakness   Lower Extremity Assessment Lower Extremity Assessment: Generalized weakness   Cervical / Trunk Assessment Cervical / Trunk Assessment: Normal   Communication Communication Communication: No difficulties   Cognition Arousal/Alertness: Awake/alert Behavior During Therapy: WFL for tasks assessed/performed Overall Cognitive Status: Within Functional Limits for tasks assessed                                     General Comments  noted L flank hematoma,     Exercises     Shoulder Instructions      Home Living Family/patient expects to be discharged to:: Private residence Living Arrangements: Spouse/significant other(pt tells me he has 24 hour support) Available Help at Discharge: Family Type of Home: House Home Access: Stairs to enter Technical brewer of Steps: 3-4 Entrance Stairs-Rails: Left Home Layout: One level     Bathroom Shower/Tub: Occupational psychologist: Handicapped height         Additional Comments: pt reports a fully accessible home      Prior Functioning/Environment Level of Independence: Independent        Comments: typically active, drove and able to walk without AD until 2 weeks ago        OT Problem List: Decreased range of motion;Decreased activity tolerance;Impaired balance (sitting and/or standing);Decreased knowledge of use of DME or AE;Cardiopulmonary status limiting activity;Pain      OT Treatment/Interventions: Self-care/ADL training;Therapeutic exercise;Energy conservation;DME and/or AE instruction;Therapeutic activities;Patient/family education;Balance training    OT Goals(Current goals can be found in the care plan section) Acute Rehab OT Goals Patient Stated Goal: Verbalized hopes to  rehab at CIR OT Goal Formulation: With patient Potential to Achieve Goals: Good ADL Goals Pt Will Perform Grooming: with modified independence;sitting;standing Pt Will Perform Upper Body Dressing: with modified independence Pt Will Perform Lower Body Dressing: with modified independence;sit to/from stand Pt Will Transfer to Toilet: with modified independence;ambulating Additional ADL Goal #1: Pt will demonstrate independence with energy conservation strategies during ADL completion.  OT Frequency: Min 2X/week   Barriers to D/C: Decreased caregiver support          Co-evaluation PT/OT/SLP Co-Evaluation/Treatment: Yes Reason for Co-Treatment: Complexity of the patient's impairments (multi-system involvement);For patient/therapist safety;To address functional/ADL transfers   OT goals addressed during session: ADL's and self-care      AM-PAC OT "6 Clicks" Daily Activity     Outcome Measure Help from another person eating meals?: A Little Help from another person taking care of personal grooming?: A Little Help from another person toileting, which includes using toliet, bedpan, or urinal?: A Lot Help from another person bathing (including washing, rinsing, drying)?: A Lot Help from another person to put on and taking off regular upper body clothing?: A Little Help from another person to put on and taking off regular lower body clothing?: A Lot 6 Click Score: 15   End of Session Equipment Utilized During Treatment: Gait belt;Rolling walker;Oxygen Nurse Communication: Mobility status  Activity Tolerance: Patient tolerated treatment well Patient left: in chair;with call [name]/phone within reach;with chair alarm set  OT Visit Diagnosis: Unsteadiness on feet (R26.81);Other abnormalities of gait and mobility (R26.89);Muscle weakness (generalized) (M62.81);Pain  Pain - Right/Left: Left Pain - part of body: Shoulder                Time: [id number] OT Time Calculation (min): 35  min Charges:  OT General Charges $OT Visit: 1 Visit OT Evaluation $OT Eval Moderate Complexity: [place] OTR/L Acute Rehabilitation Services Office: [place] 12/15/2018, 10:44 AM

## 2018-12-15 NOTE — Progress Notes (Signed)
Physical Therapy Treatment Patient Details Name: [name] MRN: [id number] DOB: [birth year] Today's Date: 12/15/2018    History of Present Illness Pt is 73 yo male with onset of a-fib was admitted for cardioversion, done 9/25 and referred to PT.  Pt was lethargic and had difficulty with hematoma forming.  Order for PT was discontinued after PT saw him.  PMHx:  CABG, PAF, encephalopathy, respiratory failure, OSA, DM, gout, HTN    PT Comments    Patient received in bed, hopeful to go to CIR. Patient agrees to PT/OT session. Patient required mod assist for bed mobility. Mod assist for sit to stand and walked over to recliner using RW. Performed STS x 4 reps with mod assist. Patient then ambulated 5 feet forward and 5 feet back to recliner. Patient fatigued with this. Patient will benefit from continued skilled PT to address his weakness, decreased activity tolerance and to improve functional independence.      Follow Up Recommendations  CIR     Equipment Recommendations  Rolling walker with 5" wheels;3in1 (PT)    Recommendations for Other Services Rehab consult     Precautions / Restrictions Precautions Precautions: Fall Restrictions Weight Bearing Restrictions: No    Mobility  Bed Mobility Overal bed mobility: Needs Assistance Bed Mobility: Supine to Sit     Supine to sit: Mod assist     General bed mobility comments: modA to progress trunk upright  Transfers Overall transfer level: Needs assistance Equipment used: Rolling walker (2 wheeled) Transfers: Sit to/from Stand Sit to Stand: Mod assist;+2 physical assistance Stand pivot transfers: Mod assist;+2 physical assistance;+2 safety/equipment       General transfer comment: moderate assist to power up;modA for stand-pivot;  Ambulation/Gait Ambulation/Gait assistance: Mod assist;+2 physical assistance Gait Distance (Feet): 4 Feet Assistive device: Rolling walker (2 wheeled) Gait Pattern/deviations: Decreased  stride length;Step-through pattern;Decreased step length - left;Decreased step length - right;Shuffle Gait velocity: decreased   General Gait Details: good ability with transferring to recliner, took 5 steps forward and could not go any further, 5 steps back to recliner. O2 sats and fatigue limit ability to progress   Chief Strategy Officer    Modified Rankin (Stroke Patients Only)       Balance Overall balance assessment: Needs assistance Sitting-balance support: Feet supported;Bilateral upper extremity supported Sitting balance-Leahy Scale: Fair Sitting balance - Comments: min guard for sitting edge of bed   Standing balance support: Bilateral upper extremity supported Standing balance-Leahy Scale: Fair Standing balance comment: heavy reliance on BUE                            Cognition Arousal/Alertness: Awake/alert Behavior During Therapy: WFL for tasks assessed/performed Overall Cognitive Status: No family/caregiver present to determine baseline cognitive functioning Area of Impairment: Following commands;Safety/judgement;Problem solving                       Following Commands: Follows one step commands with increased time Safety/Judgement: Decreased awareness of safety   Problem Solving: Slow processing;Requires verbal cues;Requires tactile cues;Difficulty sequencing General Comments: Patient focused on wanting to get to IR      Exercises      General Comments General comments (skin integrity, edema, etc.): noted L flank hematoma,       Pertinent Vitals/Pain Pain Assessment: Faces Faces Pain Scale: Hurts little more Pain Location: L prox UE/shoulder with  motion Pain Descriptors / Indicators: Grimacing;Guarding Pain Intervention(s): Monitored during session;Limited activity within patient's tolerance;Repositioned    Home Living Family/patient expects to be discharged to:: Private residence Living Arrangements:  Spouse/significant other(pt tells me he has 24 hour support) Available Help at Discharge: Family Type of Home: House Home Access: Stairs to enter Entrance Stairs-Rails: Left Home Layout: One level   Additional Comments: pt reports a fully accessible home    Prior Function Level of Independence: Independent      Comments: typically active, drove and able to walk without AD until 2 weeks ago   PT Goals (current goals can now be found in the care plan section) Acute Rehab PT Goals Patient Stated Goal: Verbalized hopes to rehab at [facility] PT Goal Formulation: With patient Time For Goal Achievement: 12/28/18 Potential to Achieve Goals: Good Progress towards PT goals: Progressing toward goals    Frequency    Min 3X/week      PT Plan Current plan remains appropriate    Co-evaluation PT/OT/SLP Co-Evaluation/Treatment: Yes Reason for Co-Treatment: For patient/therapist safety;To address functional/ADL transfers PT goals addressed during session: Mobility/safety with mobility;Balance;Proper use of DME OT goals addressed during session: ADL's and self-care      AM-PAC PT "6 Clicks" Mobility   Outcome Measure  Help needed turning from your back to your side while in a flat bed without using bedrails?: A Lot Help needed moving from lying on your back to sitting on the side of a flat bed without using bedrails?: A Lot Help needed moving to and from a bed to a chair (including a wheelchair)?: A Little Help needed standing up from a chair using your arms (e.g., wheelchair or bedside chair)?: A Little Help needed to walk in hospital room?: A Little Help needed climbing 3-5 steps with a railing? : A Lot 6 Click Score: 15    End of Session Equipment Utilized During Treatment: Gait belt;Oxygen Activity Tolerance: Patient limited by fatigue;Patient tolerated treatment well Patient left: in chair;with chair alarm set;with call bell/phone within reach;with SCD's reapplied Nurse  Communication: Mobility status PT Visit Diagnosis: Muscle weakness (generalized) (M62.81);Difficulty in walking, not elsewhere classified (R26.2)     Time: 0930-1005 PT Time Calculation (min) (ACUTE ONLY): 35 min  Charges:  $Gait Training: 8-22 mins $Therapeutic Activity: 8-22 mins                     Dejha King, PT, GCS 12/15/18,12:49 PM

## 2018-12-15 NOTE — TOC Progression Note (Signed)
Transition of Care [facility]) - Progression Note    Patient Details  Name: [name] MRN: [id number] Date of Birth: [birth year]  Transition of Care [facility]) CM/SW [place], [place] Phone Number: [phone] 12/15/2018, 8:51 AM  Clinical Narrative:     CSW discussed CIR recommendation with patient's daughter who reports family would be in agreement of this recommendation and they would like to move forward with CIR as discharge plan.   CSW has informed CIR of this information.   Expected Discharge Plan: [place] Barriers to Discharge: Continued Medical Work up  Expected Discharge Plan and Services Expected Discharge Plan: [place] Choice: [place] arrangements for the past 2 months: Single Family Home                                       Social Determinants of Health (SDOH) Interventions    Readmission Risk Interventions No flowsheet data found.

## 2018-12-16 ENCOUNTER — Encounter (HOSPITAL_COMMUNITY): Payer: Self-pay

## 2018-12-16 ENCOUNTER — Inpatient Hospital Stay (HOSPITAL_COMMUNITY)
Admission: RE | Admit: 2018-12-16 | Discharge: 2018-12-24 | DRG: 071 | Disposition: A | Discharge status: Home Health | Payer: Medicare Other | Source: Intra-hospital | Attending: Physical Medicine & Rehabilitation | Admitting: Physical Medicine & Rehabilitation

## 2018-12-16 ENCOUNTER — Other Ambulatory Visit: Payer: Self-pay

## 2018-12-16 DIAGNOSIS — Y92239 Unspecified place in hospital as the place of occurrence of the external cause: Secondary | ICD-10-CM | POA: Diagnosis present

## 2018-12-16 DIAGNOSIS — Z7984 Long term (current) use of oral hypoglycemic drugs: Secondary | ICD-10-CM | POA: Diagnosis not present

## 2018-12-16 DIAGNOSIS — Z79899 Other long term (current) drug therapy: Secondary | ICD-10-CM

## 2018-12-16 DIAGNOSIS — Z9841 Cataract extraction status, right eye: Secondary | ICD-10-CM

## 2018-12-16 DIAGNOSIS — Z955 Presence of coronary angioplasty implant and graft: Secondary | ICD-10-CM | POA: Diagnosis not present

## 2018-12-16 DIAGNOSIS — Z951 Presence of aortocoronary bypass graft: Secondary | ICD-10-CM

## 2018-12-16 DIAGNOSIS — D62 Acute posthemorrhagic anemia: Secondary | ICD-10-CM | POA: Diagnosis not present

## 2018-12-16 DIAGNOSIS — E1151 Type 2 diabetes mellitus with diabetic peripheral angiopathy without gangrene: Secondary | ICD-10-CM | POA: Diagnosis present

## 2018-12-16 DIAGNOSIS — G4733 Obstructive sleep apnea (adult) (pediatric): Secondary | ICD-10-CM | POA: Diagnosis not present

## 2018-12-16 DIAGNOSIS — I2510 Atherosclerotic heart disease of native coronary artery without angina pectoris: Secondary | ICD-10-CM | POA: Diagnosis not present

## 2018-12-16 DIAGNOSIS — M48061 Spinal stenosis, lumbar region without neurogenic claudication: Secondary | ICD-10-CM | POA: Diagnosis present

## 2018-12-16 DIAGNOSIS — M109 Gout, unspecified: Secondary | ICD-10-CM | POA: Diagnosis present

## 2018-12-16 DIAGNOSIS — G9340 Encephalopathy, unspecified: Secondary | ICD-10-CM | POA: Diagnosis not present

## 2018-12-16 DIAGNOSIS — Z801 Family history of malignant neoplasm of trachea, bronchus and lung: Secondary | ICD-10-CM | POA: Diagnosis not present

## 2018-12-16 DIAGNOSIS — E039 Hypothyroidism, unspecified: Secondary | ICD-10-CM | POA: Diagnosis not present

## 2018-12-16 DIAGNOSIS — I4819 Other persistent atrial fibrillation: Secondary | ICD-10-CM | POA: Diagnosis present

## 2018-12-16 DIAGNOSIS — K5900 Constipation, unspecified: Secondary | ICD-10-CM | POA: Diagnosis present

## 2018-12-16 DIAGNOSIS — M7981 Nontraumatic hematoma of soft tissue: Secondary | ICD-10-CM | POA: Diagnosis not present

## 2018-12-16 DIAGNOSIS — E1169 Type 2 diabetes mellitus with other specified complication: Secondary | ICD-10-CM

## 2018-12-16 DIAGNOSIS — Z9842 Cataract extraction status, left eye: Secondary | ICD-10-CM | POA: Diagnosis not present

## 2018-12-16 DIAGNOSIS — K219 Gastro-esophageal reflux disease without esophagitis: Secondary | ICD-10-CM | POA: Diagnosis present

## 2018-12-16 DIAGNOSIS — Z7901 Long term (current) use of anticoagulants: Secondary | ICD-10-CM

## 2018-12-16 DIAGNOSIS — G4700 Insomnia, unspecified: Secondary | ICD-10-CM | POA: Diagnosis present

## 2018-12-16 DIAGNOSIS — I69354 Hemiplegia and hemiparesis following cerebral infarction affecting left non-dominant side: Secondary | ICD-10-CM | POA: Diagnosis not present

## 2018-12-16 DIAGNOSIS — R0789 Other chest pain: Secondary | ICD-10-CM | POA: Diagnosis not present

## 2018-12-16 DIAGNOSIS — R410 Disorientation, unspecified: Secondary | ICD-10-CM

## 2018-12-16 DIAGNOSIS — Z823 Family history of stroke: Secondary | ICD-10-CM

## 2018-12-16 DIAGNOSIS — I110 Hypertensive heart disease with heart failure: Secondary | ICD-10-CM | POA: Diagnosis present

## 2018-12-16 DIAGNOSIS — M47816 Spondylosis without myelopathy or radiculopathy, lumbar region: Secondary | ICD-10-CM | POA: Diagnosis present

## 2018-12-16 DIAGNOSIS — R4189 Other symptoms and signs involving cognitive functions and awareness: Secondary | ICD-10-CM | POA: Diagnosis present

## 2018-12-16 DIAGNOSIS — D500 Iron deficiency anemia secondary to blood loss (chronic): Secondary | ICD-10-CM

## 2018-12-16 DIAGNOSIS — S20219A Contusion of unspecified front wall of thorax, initial encounter: Secondary | ICD-10-CM

## 2018-12-16 DIAGNOSIS — Z6830 Body mass index (BMI) 30.0-30.9, adult: Secondary | ICD-10-CM

## 2018-12-16 DIAGNOSIS — E669 Obesity, unspecified: Secondary | ICD-10-CM | POA: Diagnosis present

## 2018-12-16 DIAGNOSIS — Z79890 Hormone replacement therapy (postmenopausal): Secondary | ICD-10-CM

## 2018-12-16 DIAGNOSIS — I5032 Chronic diastolic (congestive) heart failure: Secondary | ICD-10-CM | POA: Diagnosis not present

## 2018-12-16 DIAGNOSIS — Z79891 Long term (current) use of opiate analgesic: Secondary | ICD-10-CM

## 2018-12-16 DIAGNOSIS — E785 Hyperlipidemia, unspecified: Secondary | ICD-10-CM | POA: Diagnosis present

## 2018-12-16 DIAGNOSIS — R300 Dysuria: Secondary | ICD-10-CM

## 2018-12-16 DIAGNOSIS — T45515A Adverse effect of anticoagulants, initial encounter: Secondary | ICD-10-CM | POA: Diagnosis present

## 2018-12-16 LAB — BASIC METABOLIC PANEL
Anion gap: 8 (ref 5–15)
BUN: 22 mg/dL (ref 8–23)
CO2: 31 mmol/L (ref 22–32)
Calcium: 8.3 mg/dL — ABNORMAL LOW (ref 8.9–10.3)
Chloride: 98 mmol/L (ref 98–111)
Creatinine, Ser: 1.05 mg/dL (ref 0.61–1.24)
GFR calc Af Amer: >60 mL/min (ref >60)
GFR calc non Af Amer: >60 mL/min (ref >60)
Glucose, Bld: 105 mg/dL — ABNORMAL HIGH (ref 70–99)
Potassium: 3.4 mmol/L — ABNORMAL LOW (ref 3.5–5.1)
Sodium: 137 mmol/L (ref 135–145)

## 2018-12-16 LAB — URINALYSIS, COMPLETE (UACMP) WITH MICROSCOPIC
Bacteria, UA: NONE SEEN
Bilirubin Urine: NEGATIVE
Glucose, UA: NEGATIVE mg/dL
Hgb urine dipstick: NEGATIVE
Ketones, ur: NEGATIVE mg/dL
Leukocytes,Ua: NEGATIVE
Nitrite: NEGATIVE
Protein, ur: NEGATIVE mg/dL
Specific Gravity, Urine: 1.02 (ref 1.005–1.030)
pH: 5 (ref 5.0–8.0)

## 2018-12-16 LAB — CBC
HCT: 28.3 % — ABNORMAL LOW (ref 39.0–52.0)
Hemoglobin: 9 g/dL — ABNORMAL LOW (ref 13.0–17.0)
MCH: 28.9 pg (ref 26.0–34.0)
MCHC: 31.8 g/dL (ref 30.0–36.0)
MCV: 91 fL (ref 80.0–100.0)
Platelets: 182 10*3/uL (ref 150–400)
RBC: 3.11 MIL/uL — ABNORMAL LOW (ref 4.22–5.81)
RDW: 14.9 % (ref 11.5–15.5)
WBC: 8.9 10*3/uL (ref 4.0–10.5)
nRBC: 0.3 % — ABNORMAL HIGH (ref 0.0–0.2)

## 2018-12-16 LAB — TYPE AND SCREEN
ABO/RH(D): AB POS
Antibody Screen: NEGATIVE
Unit division: 0
Unit division: 0

## 2018-12-16 LAB — GLUCOSE, CAPILLARY
Glucose-Capillary: 120 mg/dL — ABNORMAL HIGH (ref 70–99)
Glucose-Capillary: 130 mg/dL — ABNORMAL HIGH (ref 70–99)
Glucose-Capillary: 126 mg/dL — ABNORMAL HIGH (ref 70–99)
Glucose-Capillary: 125 mg/dL — ABNORMAL HIGH (ref 70–99)

## 2018-12-16 LAB — PROTIME-INR
INR: 1.5 — ABNORMAL HIGH (ref 0.8–1.2)
Prothrombin Time: 17.6 seconds — ABNORMAL HIGH (ref 11.4–15.2)

## 2018-12-16 LAB — BPAM RBC
Blood Product Expiration Date: 202010222359
Blood Product Expiration Date: 202010222359
ISSUE DATE / TIME: 202009301143
ISSUE DATE / TIME: 202009301538
Unit Type and Rh: 6200
Unit Type and Rh: 6200

## 2018-12-16 MED ORDER — ACETAMINOPHEN 325 MG PO TABS
650.0000 mg | ORAL_TABLET | ORAL | Status: DC | PRN
  Administered 2018-12-17: 650 mg via ORAL
  Filled 2018-12-16: qty 2

## 2018-12-16 MED ORDER — FERROUS SULFATE 325 (65 FE) MG PO TABS
325.0000 mg | ORAL_TABLET | Freq: Two times a day (BID) | ORAL | Status: DC
  Administered 2018-12-17 – 2018-12-24 (×15): 325 mg via ORAL
  Filled 2018-12-16 (×15): qty 1

## 2018-12-16 MED ORDER — TRAMADOL HCL 50 MG PO TABS
50.0000 mg | ORAL_TABLET | Freq: Two times a day (BID) | ORAL | Status: DC | PRN
  Administered 2018-12-16: 16:00:00 50 mg via ORAL
  Filled 2018-12-16: qty 1

## 2018-12-16 MED ORDER — POTASSIUM CHLORIDE CRYS ER 20 MEQ PO TBCR
40.0000 meq | EXTENDED_RELEASE_TABLET | Freq: Once | ORAL | Status: AC
  Administered 2018-12-16: 11:00:00 40 meq via ORAL
  Filled 2018-12-16: qty 2

## 2018-12-16 MED ORDER — FOLIC ACID 1 MG PO TABS: 1.0000 mg | ORAL_TABLET | Freq: Every day | ORAL | Status: DC

## 2018-12-16 MED ORDER — FERROUS SULFATE 325 (65 FE) MG PO TABS: 325.0000 mg | ORAL_TABLET | Freq: Two times a day (BID) | ORAL | 3 refills | Status: DC

## 2018-12-16 MED ORDER — TRAZODONE HCL 50 MG PO TABS
50.0000 mg | ORAL_TABLET | Freq: Every evening | ORAL | Status: DC | PRN
  Administered 2018-12-16 – 2018-12-23 (×8): 50 mg via ORAL
  Filled 2018-12-16 (×8): qty 1

## 2018-12-16 MED ORDER — DILTIAZEM HCL ER COATED BEADS 240 MG PO CP24: 240.0000 mg | ORAL_CAPSULE | Freq: Two times a day (BID) | ORAL | Status: DC

## 2018-12-16 MED ORDER — METFORMIN HCL 500 MG PO TABS
500.0000 mg | ORAL_TABLET | Freq: Every day | ORAL | Status: DC
  Administered 2018-12-17 – 2018-12-24 (×8): 500 mg via ORAL
  Filled 2018-12-16 (×8): qty 1

## 2018-12-16 MED ORDER — SORBITOL 70 % SOLN
30.0000 mL | Freq: Every day | Status: DC | PRN
  Administered 2018-12-18 – 2018-12-20 (×2): 30 mL via ORAL
  Filled 2018-12-16 (×2): qty 30

## 2018-12-16 MED ORDER — FOLIC ACID 1 MG PO TABS
1.0000 mg | ORAL_TABLET | Freq: Every day | ORAL | Status: DC
  Administered 2018-12-17 – 2018-12-24 (×8): 1 mg via ORAL
  Filled 2018-12-16 (×8): qty 1

## 2018-12-16 MED ORDER — FUROSEMIDE 40 MG PO TABS: 40.0000 mg | ORAL_TABLET | Freq: Every day | ORAL | 1 refills | Status: DC | PRN

## 2018-12-16 MED ORDER — TRAMADOL HCL 50 MG PO TABS
50.0000 mg | ORAL_TABLET | Freq: Two times a day (BID) | ORAL | Status: DC | PRN
  Administered 2018-12-17: 50 mg via ORAL
  Filled 2018-12-16 (×2): qty 1

## 2018-12-16 MED ORDER — DILTIAZEM HCL ER COATED BEADS 240 MG PO CP24
240.0000 mg | ORAL_CAPSULE | Freq: Two times a day (BID) | ORAL | Status: DC
  Administered 2018-12-16 – 2018-12-24 (×15): 240 mg via ORAL
  Filled 2018-12-16 (×16): qty 1

## 2018-12-16 MED ORDER — SENNOSIDES-DOCUSATE SODIUM 8.6-50 MG PO TABS
1.0000 | ORAL_TABLET | Freq: Every evening | ORAL | Status: DC | PRN
  Administered 2018-12-16 – 2018-12-19 (×3): 1 via ORAL
  Filled 2018-12-16 (×3): qty 1

## 2018-12-16 MED ORDER — INSULIN ASPART 100 UNIT/ML ~~LOC~~ SOLN
0.0000 [IU] | Freq: Three times a day (TID) | SUBCUTANEOUS | Status: DC
  Administered 2018-12-16 – 2018-12-18 (×3): 2 [IU] via SUBCUTANEOUS
  Administered 2018-12-18: 3 [IU] via SUBCUTANEOUS
  Administered 2018-12-20: 2 [IU] via SUBCUTANEOUS

## 2018-12-16 MED ORDER — ATORVASTATIN CALCIUM 40 MG PO TABS
40.0000 mg | ORAL_TABLET | Freq: Every day | ORAL | Status: DC
  Administered 2018-12-16 – 2018-12-23 (×8): 40 mg via ORAL
  Filled 2018-12-16 (×8): qty 1

## 2018-12-16 MED ORDER — LEVOTHYROXINE SODIUM 112 MCG PO TABS
112.0000 ug | ORAL_TABLET | Freq: Every day | ORAL | Status: DC
  Administered 2018-12-17 – 2018-12-24 (×8): 112 ug via ORAL
  Filled 2018-12-16 (×8): qty 1

## 2018-12-16 MED ORDER — ATORVASTATIN CALCIUM 40 MG PO TABS: 40.0000 mg | ORAL_TABLET | Freq: Every day | ORAL | Status: DC

## 2018-12-16 NOTE — Progress Notes (Signed)
Inpatient Rehabilitation  Patient information reviewed and entered into eRehab system by Zian Delair M. Ruhani Umland, M.A., CCC/SLP, PPS Coordinator.  Information including medical coding, functional ability and quality indicators will be reviewed and updated through discharge.    

## 2018-12-16 NOTE — Progress Notes (Signed)
Spoke with Elmyra Ricks IP Rn  regarding pts. son visiting from [place]. Per IP no need for the son to be quarantined, he will just need to be screened and  wear mask  just as we do with  other visitors. Primary Rn made aware.

## 2018-12-16 NOTE — Progress Notes (Addendum)
Inpatient Rehab Admissions Coordinator:   I was able to speak with pt and his family.  All are in agreement for CIR. I will plan for possible admission today pending approval from Dr. Audie Box.   Addendum: I have approval from Dr. Audie Box and will plan to admit pt to CIR today. I will let pt/family, RN, and CM know.   Shann Medal, PT, DPT Admissions Coordinator [phone] 12/16/18  12:01 PM

## 2018-12-16 NOTE — Progress Notes (Signed)
    Put in discharge orders for Dr. Audie Box.  - Discontinued his home Benazepril, Imdur, and Coumadin. Also discontinued home Allopurinol as this was not continued during admission.  - Increased Cardizem to 240mg  twice daily.  - Changed Lasix to 40mg  daily as needed for edema or weight gain. - Continue home Lipitor, Metformin, and Synthroid.  - Continued other home medications.   Please see discharge summary for more details.   Patient being discharged CIR. Will arrange for follow-up in 1-2 months.  Darreld Mclean, PA-C 12/16/2018 4:18 PM

## 2018-12-16 NOTE — Progress Notes (Signed)
Cardiology Progress Note  Patient ID: Nahshon Geno MRN: [id number] DOB: [birth year] Date of Encounter: 12/16/2018  Primary Cardiologist: Candee Furbish, MD  Subjective  He received 2 units of packed red blood cells yesterday with appropriate response.  Hemoglobin 9.0.  He reports he feels better.  He does report pressure with urination.  Heart rate is controlled in the 80-90 range.  ROS:  All other ROS reviewed and negative. Pertinent positives noted in the HPI.     Inpatient Medications  Scheduled Meds: . sodium chloride   Intravenous Once  . atorvastatin  40 mg Oral Daily  . diltiazem  240 mg Oral BID  . ferrous sulfate  325 mg Oral BID WC  . folic acid  1 mg Oral Daily  . insulin aspart  0-15 Units Subcutaneous TID WC  . levothyroxine  112 mcg Oral QAC breakfast  . mouth rinse  15 mL Mouth Rinse BID  . potassium chloride  40 mEq Oral Once  . sodium chloride flush  3 mL Intravenous Q12H   Continuous Infusions: . sodium chloride Stopped (12/10/18 1123)   PRN Meds: acetaminophen, ondansetron (ZOFRAN) IV, senna-docusate, sodium chloride flush, traMADol, zolpidem   Vital Signs   Vitals:   12/15/18 1929 12/15/18 2021 12/16/18 0511 12/16/18 0634  BP: 128/71 117/69 114/67   Pulse: 92 94 81   Resp: 20 20 20    Temp: 99.4 F (37.4 C) 99.7 F (37.6 C) 98.4 F (36.9 C)   TempSrc: Oral Oral Oral   SpO2: 100% 100% 100%   Weight:    112.5 kg  Height:        Intake/Output Summary (Last 24 hours) at 12/16/2018 1046 Last data filed at 12/16/2018 0902 Gross per 24 hour  Intake 1730 ml  Output 1200 ml  Net 530 ml   Last 3 Weights 12/16/2018 12/15/2018 12/13/2018  Weight (lbs) 248 lb 0.3 oz 246 lb 4.1 oz 237 lb 7 oz  Weight (kg) 112.5 kg 111.7 kg 107.7 kg      Telemetry  Overnight telemetry shows atrial fibrillation with heart rate 80-90 range, which I personally reviewed.   Physical Exam   Vitals:   12/15/18 1929 12/15/18 2021 12/16/18 0511 12/16/18 0634  BP: 128/71  117/69 114/67   Pulse: 92 94 81   Resp: 20 20 20    Temp: 99.4 F (37.4 C) 99.7 F (37.6 C) 98.4 F (36.9 C)   TempSrc: Oral Oral Oral   SpO2: 100% 100% 100%   Weight:    112.5 kg  Height:         Intake/Output Summary (Last 24 hours) at 12/16/2018 1046 Last data filed at 12/16/2018 0902 Gross per 24 hour  Intake 1730 ml  Output 1200 ml  Net 530 ml    Last 3 Weights 12/16/2018 12/15/2018 12/13/2018  Weight (lbs) 248 lb 0.3 oz 246 lb 4.1 oz 237 lb 7 oz  Weight (kg) 112.5 kg 111.7 kg 107.7 kg    Body mass index is 31 kg/m.  General: Well nourished, well developed, in no acute distress Head: Atraumatic, normal size  Eyes: PEERLA, EOMI  Neck: Supple, no JVD Endocrine: No thryomegaly Cardiac: Regular rhythm, no murmurs Lungs: Diminished breath sounds at the bases bilaterally Abd: Soft, nontender, no hepatomegaly  Ext: No edema, pulses 2+ Musculoskeletal: No deformities, weakness noted in upper and lower extremities Skin: Extensive hematomas in the left axilla extending into the left flank, also similar-appearing but less severe on the right Neuro: Alert and oriented to  person, place, time, and situation, CNII-XII grossly intact, no focal deficits  Psych: Normal mood and affect   Labs  High Sensitivity Troponin:   Recent Labs  Lab 11/27/18 1527 11/27/18 1750 12/08/18 1518 12/08/18 2029 12/09/18 0024  TROPONINIHS 47* 49* 24* 30* 29*     Cardiac EnzymesNo results for input(s): TROPONINI in the last 168 hours. No results for input(s): TROPIPOC in the last 168 hours.  Chemistry Recent Labs  Lab 12/12/18 0448  12/14/18 1229 12/15/18 0424 12/16/18 0423  NA 140   < > 137 135 137  K 3.8   < > 4.3 3.7 3.4*  CL 100   < > 97* 96* 98  CO2 31   < > 28 29 31   GLUCOSE 129*   < > 128* 120* 105*  BUN 10   < > 32* 27* 22  CREATININE 1.01   < > 1.49* 1.22 1.05  CALCIUM 8.8*   < > 8.4* 8.1* 8.3*  PROT 6.2*  --   --   --   --   ALBUMIN 3.1*  --   --   --   --   AST 43*  --   --    --   --   ALT 35  --   --   --   --   ALKPHOS 53  --   --   --   --   BILITOT 2.0*  --   --   --   --   GFRNONAA >60   < > 45* 58* >60  GFRAA >60   < > 52* >60 >60  ANIONGAP 9   < > 12 10 8    < > = values in this interval not displayed.    Hematology Recent Labs  Lab 12/15/18 0424 12/15/18 2134 12/16/18 0423  WBC 11.9* 10.1 8.9  RBC 2.42* 2.97* 3.11*  HGB 6.8* 8.9* 9.0*  HCT 21.5* 26.5* 28.3*  MCV 88.8 89.2 91.0  MCH 28.1 30.0 28.9  MCHC 31.6 33.6 31.8  RDW 15.2 14.6 14.9  PLT 167 174 182   BNPNo results for input(s): BNP, PROBNP in the last 168 hours.  DDimer No results for input(s): DDIMER in the last 168 hours.   Radiology  Ct Head Wo Contrast  Result Date: 12/14/2018 CLINICAL DATA:  Altered mental status EXAM: CT HEAD WITHOUT CONTRAST TECHNIQUE: Contiguous axial images were obtained from the base of the skull through the vertex without intravenous contrast. COMPARISON:  04/06/2017 FINDINGS: Brain: Mild atrophic and chronic white matter ischemic changes are identified. No findings to suggest acute hemorrhage, acute infarction or space-occupying mass lesion are noted. Vascular: No hyperdense vessel or unexpected calcification. Skull: Normal. Negative for fracture or focal lesion. Sinuses/Orbits: No acute finding. Other: None. IMPRESSION: Chronic atrophic and ischemic changes without acute abnormality. Electronically Signed   By: Inez Catalina M.D.   On: 12/14/2018 17:09   Dg Chest Port 1 View  Result Date: 12/14/2018 CLINICAL DATA:  Shortness of breath EXAM: PORTABLE CHEST 1 VIEW COMPARISON:  12/12/2018 FINDINGS: Previous median sternotomy and CABG as before. Cardiomegaly and atherosclerosis, unchanged. Persistent elevation of right hemidiaphragm. Scarring and/or atelectasis at the right base is unchanged. IMPRESSION: Stable elevation of right hemidiaphragm following median sternotomy with juxta diaphragmatic atelectasis and scarring. No acute finding. Electronically Signed   By:  Zetta Bills M.D.   On: 12/14/2018 13:30   Patient Profile  Bari Doorley a 73 y.o.malewith history of atrial fibrillation, obesity, sleep apnea, CAD  status post CABG, diastolic heart failure who was admitted on 9/25 for atrial fibrillation with RVR. He underwent a cardioversion, but has had return of atrial fibrillation. Course complicated on 0000000 by chest wall hematoma.  Assessment & Plan   1.  Altered mental status noted on 9/29 -CT head negative, ABG normal, chest x-ray without acute finding.  His mentation appears to wax and wane, this is likely delirium from being in the hospital, and having an acute bleed. - resolved  2. Dysuria -Reports pain with urination -Has a condom cath -We will send a urinalysis  3.Acute chest wall hematoma, acute blood loss anemia, secondary to anticoagulation -He received 2 units of packed red blood cells for hemoglobin of 6.8 on 9/30 -Hemoglobin today is 9.0 with appropriate response further indicating his bleeding is stopped -His bruises appear a little worse, and are moving toward the lower extremities likely consistent with gravity -We will continue to monitor CBCs daily -We will continue iron and folic acid supplementation -He is no longer a candidate for anticoagulation, I assume he could be evaluated for a watchman device once he is healed up and is completed a rehabilitation stay  4.Atrial fibrillation with RVR -We will not attempt another cardioversion -We will continue rate control with Cardizem 240 mg twice daily -No anticoagulation due to acute bleed -Would recommend he be evaluated for a watchman at a later date, after rehabilitation and out of the hospital and much better  5.CAD status post CABG -We will continue to hold his aspirin and this can be readdressed in the outpatient setting -Continue statin  6.Diastolic heart failure -Euvolemic on exam today  # FEN -No intravenous fluids -Salt strict diet -DVT  prophylaxis: SCDs in setting of acute blood loss anemia -CODE STATUS: Full -Disposition: He will have an evaluation by inpatient rehabilitation as recommended by physical therapy    For questions or updates, please contact [facility] Please consult [email or web address] for contact info under        Signed, Lake Bells T. Audie Box, [place]  12/16/2018 10:46 AM

## 2018-12-16 NOTE — Progress Notes (Signed)
Pt arrived in bed, pt is alert and able to make needs known, was incontinent prior to this admission x 2 per pt. LBM was 12/15/18. Pt has irregular hart rate, large hematoma to left flank going in to back that has exceeded markings this am. Additionally, pl has hematoma to right flank that is still within its markings this am. BS are positive in all 4 quads. No c/o of pain at this time. Bed in low position, call bell in reach.

## 2018-12-16 NOTE — PMR Pre-admission (Signed)
PMR Admission Coordinator Pre-Admission Assessment  Patient: [name] is an 73 y.o., male MRN: [id number] DOB: 07-07-1945 Height: 6' 3" (190.5 cm) Weight: 112.5 kg  Insurance Information HMO:     PPO:      PCP:      IPA:      80/20:      OTHER:  PRIMARY: Medicare A and B      Policy#: [id number]      Subscriber: patient CM Name:       Phone#:      Fax#:  Pre-Cert#: verified eligibility online      Employer:  Benefits:  Phone #:      Name:  Eff. Date: 02/14/01 A and B     Deduct: $1408      Out of Pocket Max: n/a      Life Max: n/a CIR: 100%      SNF: 20 full days Outpatient:      Co-Pay:  Home Health:       Co-Pay:  DME:      Co-Pay:  Providers:  SECONDARY:       Policy#:       Subscriber:  CM Name:       Phone#:      Fax#:  Pre-Cert#:       Employer:  Benefits:  Phone #:      Name:  Eff. Date:      Deduct:       Out of Pocket Max:       Life Max:  CIR:       SNF:  Outpatient:      Co-Pay:  Home Health:       Co-Pay:  DME:      Co-Pay:   Medicaid Application Date:       Case Manager:  Disability Application Date:       Case Worker:   The "Data Collection Information Summary" for patients in Inpatient Rehabilitation Facilities with attached "Privacy Act [place] Records" was provided and verbally reviewed with: Patient and Family  Emergency Contact Information Contact Information    Name Relation Home Work [place] Spouse [phone]  [phone]   [facility] Daughter   [phone]      Current Medical History  Patient Admitting Diagnosis: debility   History of Present Illness: [name] is a 73 year old right-handed male with history of congestive heart failure, hypertension, CAD with CABG 2004, CVA with residual left-sided weakness, diabetes mellitus, OSA on nighttime oxygen 2 L and persistent atrial fibrillation on Coumadin therapy.  Presented 12/09/2018 with recurrent atrial fibrillation, shortness of breath and altered  mental status and attempts made at cardioversion unsuccessful placed on IV diltiazem drip.  INR on admission of 2.0.  Echocardiogram did not reveal any ischemic changes.  Patient remained on IV heparin as well as Coumadin.  Rapid response 12/12/2018 for mild disorientation and dysarthria as well as large hematoma left lateral chest..  No trauma noted.  Suspect spontaneous chest wall hematoma per cardiology services and heparin and warfarin were discontinued.  Hemoglobin dipped to 6.8 he was transfused 2 units of packed red blood cells latest hemoglobin 9.0.  Cranial CT scan of the head was completed 12/14/2018 due to altered mental status showed no acute changes as well as ABGs unremarkable.  Patient would remain off anticoagulation at this time consideration for watchman device and anticoagulation could be readdressed as outpatient.  Tolerating a regular diet.  Patient was admitted for a comprehensive  rehab program.    Patient's medical record from [facility] has been reviewed by the rehabilitation admission coordinator and physician.  Past Medical History  Past Medical History:  Diagnosis Date  . Acute encephalopathy 12/12/2015  . Acute respiratory failure with hypoxia and hypercapnia ([place]) 12/12/2015  . Allergic rhinitis 02/13/2010   Qualifier: Diagnosis of  By: Wynona Luna   . Angina at rest [facility]) 10/22/2015  . Ataxia 08/10/2013  . Atrial fibrillation ([place]) 11/2018  . B12 deficiency 08/11/2013  . BACK PAIN, LUMBAR 01/29/2009   Qualifier: Diagnosis of  By: Wynona Luna   . BENIGN POSITIONAL VERTIGO 11/23/2009   Qualifier: Diagnosis of  By: Wynona Luna   . Chronic diastolic heart failure ([place]) 04/24/2015  . Chronic pain    left sided-[place]  . Coronary 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 ([place]) 11/04/2015   s/p CABG 2004; CFX DES 2011  . Essential hypertension 12/12/2006   Qualifier: Diagnosis of  By: [name] RMA, Lucy    . GERD 12/12/2006   Qualifier:  Diagnosis of  By: [name] RMA, Lucy    . GOUT 12/12/2006   Qualifier: Diagnosis of  By: Reatha Armour, Lucy    . Hyperlipidemia LDL goal <70 12/12/2006   Qualifier: Diagnosis of  By: [name] RMA, Lucy     . Hypogonadism male 04/10/2011  . Hypothyroidism, acquired 02/15/2013  . Insomnia 05/21/2013  . Long term current use of anticoagulant therapy 06/24/2011  . Obstructive sleep apnea-Failed CPAP 06/17/2007   Failed CPAP Using O 2 for sleep   . Paroxysmal atrial fibrillation ([place]) 12/06/2016  . Stroke (cerebrum) ([place]) 01/13/2017   also 1996; resultant hemiplegia of dominant side  . Thoracic aorta atherosclerosis ([place]) 04/27/2017  . Tubular adenoma of colon 05/2010  . Type 2 diabetes mellitus without complication, without long-term current use of insulin ([place]) 10/05/2007   Qualifier: Diagnosis of  By: Wynona Luna   . Vasovagal syncope     Family History   family history includes Lung cancer in his father; Stroke in his mother.  Prior Rehab/Hospitalizations Has the patient had prior rehab or hospitalizations prior to admission? Yes  Has the patient had major surgery during 100 days prior to admission? No   Current Medications  Current Facility-Administered Medications:  .  0.9 %  sodium chloride infusion (Manually program via Guardrails IV Fluids), , Intravenous, Once, O'Neal, Cassie Freer, MD .  0.9 %  sodium chloride infusion, 250 mL, Intravenous, Continuous, Duke, Tami Lin, [place], Stopped at 12/10/18 1123 .  acetaminophen (TYLENOL) tablet 650 mg, 650 mg, Oral, Q4H PRN, Croitoru, Mihai, MD, 650 mg at 12/12/18 0636 .  atorvastatin (LIPITOR) tablet 40 mg, 40 mg, Oral, Daily, Croitoru, Mihai, MD, 40 mg at 12/16/18 1059 .  diltiazem (CARDIZEM CD) 24 hr capsule 240 mg, 240 mg, Oral, BID, O'Neal, Cassie Freer, MD, 240 mg at 12/16/18 1058 .  ferrous sulfate tablet 325 mg, 325 mg, Oral, BID WC, O'Neal, Cassie Freer, MD, 325 mg at 12/16/18 1059 .  folic acid (FOLVITE) tablet 1 mg, 1 mg, Oral, Daily,  O'Neal, Cassie Freer, MD, 1 mg at 12/16/18 1059 .  insulin aspart (novoLOG) injection 0-15 Units, 0-15 Units, Subcutaneous, TID WC, O'Neal, Cassie Freer, MD, 2 Units at 12/16/18 1301 .  levothyroxine (SYNTHROID) tablet 112 mcg, 112 mcg, Oral, QAC breakfast, Kathyrn Drown D, NP, 112 mcg at 12/16/18 2297 .  MEDLINE mouth rinse, 15 mL, Mouth Rinse, BID, Croitoru,  Mihai, MD, 15 mL at 12/16/18 1302 .  ondansetron (ZOFRAN) injection 4 mg, 4 mg, Intravenous, Q6H PRN, Croitoru, Mihai, MD .  senna-docusate (Senokot-S) tablet 1 tablet, 1 tablet, Oral, QHS PRN, Consuelo Pandy, PA-C, 1 tablet at 12/16/18 1059 .  sodium chloride flush (NS) 0.9 % injection 3 mL, 3 mL, Intravenous, Q12H, Duke, Tami Lin, PA, 3 mL at 12/16/18 1059 .  sodium chloride flush (NS) 0.9 % injection 3 mL, 3 mL, Intravenous, PRN, Ledora Bottcher, PA, 3 mL at 12/11/18 2218 .  traMADol (ULTRAM) tablet 50 mg, 50 mg, Oral, Q12H PRN, O'Neal, Cassie Freer, MD .  zolpidem [facility]) tablet 5 mg, 5 mg, Oral, QHS PRN, [name] Maw, MD, 5 mg at 12/15/18 2133  Patients Current Diet:  Diet Order            Diet - low sodium heart healthy        Diet Heart Room service appropriate? Yes; Fluid consistency: Thin  Diet effective now              Precautions / Restrictions Precautions Precautions: Fall Restrictions Weight Bearing Restrictions: No   Has the patient had 2 or more falls or a fall with injury in the past year? No  Prior Activity Level Community (5-7x/wk): went out daily for a few hours, RW for mobility, still driving  Prior Functional Level Self Care: Did the patient need help bathing, dressing, using the toilet or eating? Independent  Indoor Mobility: Did the patient need assistance with walking from room to room (with or without device)? Independent  Stairs: Did the patient need assistance with internal or external stairs (with or without device)? Independent  Functional Cognition: Did the  patient need help planning regular tasks such as shopping or remembering to take medications? Independent  Home Assistive Devices / Equipment Home Assistive Devices/Equipment: Gilford Rile (specify type), Eyeglasses  Prior Device Use: Indicate devices/aids used by the patient prior to current illness, exacerbation or injury? Walker  Current Functional Level Cognition  Overall Cognitive Status: No family/caregiver present to determine baseline cognitive functioning Orientation Level: Oriented X4 Following Commands: Follows one step commands consistently Safety/Judgement: Decreased awareness of deficits General Comments: pt easily distracted by phone and needs cues to remain attentive to task    Extremity Assessment (includes Sensation/Coordination)  Upper Extremity Assessment: Generalized weakness  Lower Extremity Assessment: Generalized weakness    ADLs  Overall ADL's : Needs assistance/impaired Eating/Feeding: Set up, Sitting Grooming: Set up, Sitting Upper Body Bathing: Set up, Sitting Lower Body Bathing: Moderate assistance, +2 for physical assistance, Sit to/from stand Upper Body Dressing : Set up, Sitting Lower Body Dressing: Moderate assistance, +2 for physical assistance, Sit to/from stand Lower Body Dressing Details (indicate cue type and reason): assist to don slippers  Toilet Transfer: Moderate assistance, +2 for physical assistance, Stand-pivot, RW Toilet Transfer Details (indicate cue type and reason): simulated from EOB to recliner Toileting- Clothing Manipulation and Hygiene: Total assistance Toileting - Clothing Manipulation Details (indicate cue type and reason): heavy reliance on BUE while standing pt would require assist for posterior pericare Functional mobility during ADLs: Moderate assistance, Rolling walker, +2 for physical assistance, +2 for safety/equipment General ADL Comments: pt limited by decreased activity tolerance, weakness, and wob;unable to get clear o2  reading, pt reports minor dizziness while sitting EOB, increased wob with mobiltiy;pt required maxA for pursed lip breathing, pt on 2lnc    Mobility  Overal bed mobility: Needs Assistance Bed Mobility: Supine to Sit Supine to  sit: HOB elevated, Min assist, +2 for physical assistance Sit to supine: Max assist General bed mobility comments: HOB 45 degrees with use of rail and cues for sequence with increased time to pivot to right    Transfers  Overall transfer level: Needs assistance Equipment used: Rolling walker (2 wheeled) Transfers: Sit to/from Stand Sit to Stand: Min assist, +2 safety/equipment Stand pivot transfers: Mod assist, +2 physical assistance, +2 safety/equipment General transfer comment: assist to rise with cues for scooting to edge of surface, hand placement x 4 trials from bed and recliner. Cues and assist to control descent to surface    Ambulation / Gait / Stairs / Wheelchair Mobility  Ambulation/Gait Ambulation/Gait assistance: Min assist, +2 safety/equipment Gait Distance (Feet): 12 Feet Assistive device: Rolling walker (2 wheeled) Gait Pattern/deviations: Step-through pattern, Decreased stride length, Trunk flexed General Gait Details: cues for posture, looking up, proximity to RW and chair follow with pt able to walk 5', 12', 12' with close chair follow. SpO2 dropping to 88% on 2L with gait with seated rest return to 98% Gait velocity: decreased Gait velocity interpretation: <1.8 ft/sec, indicate of risk for recurrent falls    Posture / Balance Dynamic Sitting Balance Sitting balance - Comments: supervision EOB Balance Overall balance assessment: Needs assistance Sitting-balance support: Feet supported, Bilateral upper extremity supported Sitting balance-Leahy Scale: Fair Sitting balance - Comments: supervision EOB Standing balance support: Bilateral upper extremity supported Standing balance-Leahy Scale: Poor Standing balance comment: heavy reliance on BUE     Special needs/care consideration BiPAP/CPAP no CPM no Continuous Drip IV no Dialysis no        Days n/a Life Vest no Oxygen 2L at baseline Special Bed no Trach Size no Wound Vac (area) no      Location n/a Skin abrasion to back, ecchymosis to chest/abd, excoriated to BUEs/BLEs                             Bowel mgmt: continent, 9/30 last BM Bladder mgmt: incontinent Diabetic mgmt: yes Behavioral consideration no Chemo/radiation no   Previous Home Environment (from acute therapy documentation) Living Arrangements: Spouse/significant other(pt tells me he has 24 hour support) Available Help at Discharge: Family Type of Home: House Home Layout: One level Home Access: Stairs to enter Entrance Stairs-Rails: Left Entrance Stairs-Number of Steps: 3-4 Bathroom Shower/Tub: Multimedia programmer: Handicapped height Home Care Services: No Additional Comments: pt reports a fully accessible home  Discharge Living Setting Plans for Discharge Living Setting: Patient's home Type of Home at Discharge: House Discharge Home Layout: One level Discharge Home Access: Stairs to enter Entrance Stairs-Rails: Left Entrance Stairs-Number of Steps: 3 Discharge Bathroom Shower/Tub: Tub/shower unit Discharge Bathroom Toilet: Standard Discharge Bathroom Accessibility: Yes How Accessible: Accessible via walker Does the patient have any problems obtaining your medications?: No  Social/Family/Support Systems Anticipated Caregiver: wife, daughter, and adult grandkids Anticipated Caregiver's Contact Information: Velta Addison (dtr) [phone]; [name] (wife, [facility]) [phone] Caregiver Availability: 24/7 Discharge Plan Discussed with Primary Caregiver: Yes Is Caregiver In Agreement with Plan?: Yes Does Caregiver/Family have Issues with Lodging/Transportation while Pt is in Rehab?: No  Goals/Additional Needs Patient/Family Goal for Rehab: PT/OT supervision Expected length of stay: 12-14  days Dietary Needs: heart healthy Additional Information: 2L o2 at baseline Pt/Family Agrees to Admission and willing to participate: Yes Program Orientation Provided & Reviewed with Pt/Caregiver Including Roles  & Responsibilities: Yes  Decrease burden of Care through IP rehab admission: n/a  Possible need  for SNF placement upon discharge: not anticipated  Patient Condition: I have reviewed medical records from [facility], spoken with CM, and patient, spouse and daughter. I met with patient at the bedside for inpatient rehabilitation assessment.  Patient will benefit from ongoing PT and OT, can actively participate in 3 hours of therapy a day 5 days of the week, and can make measurable gains during the admission.  Patient will also benefit from the coordinated team approach during an Inpatient Acute Rehabilitation admission.  The patient will receive intensive therapy as well as Rehabilitation physician, nursing, social worker, and care management interventions.  Due to bladder management, safety, skin/wound care, disease management, medication administration, pain management and patient education the patient requires 24 hour a day rehabilitation nursing.  The patient is currently mod assist with mobility and basic ADLs.  Discharge setting and therapy post discharge at home with home health is anticipated.  Patient has agreed to participate in the Acute Inpatient Rehabilitation Program and will admit today.  Preadmission Screen Completed By:  Michel Santee, 12/16/2018 1:44 PM ______________________________________________________________________   Discussed status with Dr. Naaman Plummer on 12/16/18  at 1:44 PM  and received approval for admission today.  Admission Coordinator:  Michel Santee, PT, time 1:44 PM Sudie Grumbling 12/16/18    Assessment/Plan: Diagnosis: debility and encephalopathy 1. Does the need for close, 24 hr/day Medical supervision in concert with the patient's rehab needs make it  unreasonable for this patient to be served in a less intensive setting? Yes 2. Co-Morbidities requiring supervision/potential complications: chf, htn, cad, dm 3. Due to bladder management, bowel management, safety, skin/wound care, disease management, medication administration, pain management and patient education, does the patient require 24 hr/day rehab nursing? Yes 4. Does the patient require coordinated care of a physician, rehab nurse, PT (1-2 hrs/day, 5 days/week), OT (1-2 hrs/day, 5 days/week) and SLP (1-2 hrs/day, 5 days/week) to address physical and functional deficits in the context of the above medical diagnosis(es)? Yes Addressing deficits in the following areas: balance, endurance, locomotion, strength, transferring, bowel/bladder control, bathing, dressing, feeding, grooming, toileting, cognition and psychosocial support 5. Can the patient actively participate in an intensive therapy program of at least 3 hrs of therapy 5 days a week? Yes 6. The potential for patient to make measurable gains while on inpatient rehab is excellent 7. Anticipated functional outcomes upon discharge from inpatients are: supervision PT, supervision OT, modified independent and supervision SLP 8. Estimated rehab length of stay to reach the above functional goals is: 12-14 days 9. Anticipated D/C setting: Home 10. Anticipated post D/C treatments: [place] therapy 11. Overall Rehab/Functional Prognosis: excellent  MD Signature: Meredith Staggers, MD, [facility] 12/16/2018

## 2018-12-16 NOTE — Discharge Summary (Signed)
Discharge Summary  Patient ID: [name] MRN: [id number] DOB: 09-05-1945  Admit date: 12/08/2018 Discharge date: 12/16/2018  Primary Care Provider: Debbrah Alar, NP  Primary Cardiologist: Candee Furbish, MD   Discharge Diagnoses  Principal Problem:   Atrial fibrillation with rapid ventricular response [facility]) Active Problems:   Type 2 diabetes mellitus without complication, without long-term current use of insulin (HCC)   Acute on chronic diastolic heart failure (HCC)   Anticoagulated on warfarin   Elevated troponin   Chest wall hematoma   Delirium   Blood loss anemia   Dysuria   Allergies No Known Allergies  Diagnostic Studies/Procedures   DCCV 12/10/2018 - Return to [facility] with 150 J x 1.   History of Present Illness   [name] a 73 y.o.malewith history of atrial fibrillation, obesity, sleep apnea, CAD status post CABG, diastolic heart failure who was admitted on 9/25 for atrial fibrillation with RVR. He underwent a cardioversion, but has had return of atrial fibrillation. Course complicated on 5/46 by chest wall hematoma.  Hospital Course  [name] is a 73 y.o. male with the above medical history who was admitted on 9/25 for acute decompensated diastolic heart failure complicated by atrial fibrillation with RVR.  His hospital course will be delineated in the following problem based format:  Acute decompensated diastolic heart failure: He was admitted with volume overload, and found to be in atrial fibrillation with RVR.  He was diuresed effectively, and volume status improved.  He will be discharged home on Lasix 20 mg as needed.  His home ACE inhibitor was held due to relatively soft blood pressures in the setting of an acute bleed.  Decision to restart this medication can be decided on outpatient basis.  Atrial fibrillation with RVR:  The patient underwent a cardioversion on 9/25 with a brief return to normal sinus rhythm.  Later in the  afternoon that day he converted back to atrial fibrillation.  His course was complicated by a chest wall hematoma that will be described below.  Therefore he was left in atrial fibrillation, and will resume Cardizem extended release tablet 240 mg twice daily.  His rates at the time of discharge were in the 80-90 range.  Due to the bleed as described below, he will no longer be a candidate for atrial fibrillation.  He had a rather extensive spontaneous chest wall hematoma bilaterally extending into the flanks.  I would be reluctant moving forward to rechallenge him with anticoagulation given the significant bleed.  He possibly can be a candidate for a watchman or other left atrial appendage closure devices, but this can occur in the outpatient setting at a much later date after rehabilitation.  Acute blood loss anemia secondary to spontaneous chest wall hematoma: Overnight on 9/27, he was noted to have bleeding over the left chest incision into the left axilla.  He was on a heparin drip and Coumadin was being bridged at that time.  The agents were reversed.  His hemoglobin did get as low as 6.8, and he was transfused 2 units of packed red blood cells on 9/30.  His hemoglobin on the day of discharge will be 9.0.  Overall, this appears to be a spontaneous bleed that has stopped.  Given that he was admitted with a hemoglobin of 12.5, he lost a considerable amount of blood.  He was also noted to have right axillary bleeding as well, and these have been marked by nursing staff.  The bruising and hematoma appears  to be migrating distally, likely following gravity.  They do appear to be improving.  We would recommend periodic CBC upon discharge.  Moving forward, we have held his aspirin and anticoagulation.  I feel he will not be a candidate for anticoagulation moving forward given the bleed.  The decision to rechallenge him can be decided by his primary cardiologist on an outpatient basis.  He was started on iron  supplementation, and folic acid supplementation to help with marrow support.  CAD status post CABG: His aspirin was held in the setting of recent bleed, this can be rechallenged at a later date.  I anticipate he will be able to tolerate aspirin, and the bleed was just related to the combination of aspirin, heparin, Coumadin.  Delirium: The patient did experience significant hypoactive delirium during his stay.  This was related to his illness of diastolic heart failure, atrial fibrillation with RVR, acute spontaneous bleed.  A CT head was obtained that did not show any significant bleeding or acute stroke.  Work-up including UA and chest x-ray were negative.  His condition improved and he is stable at time of discharge.  Dysuria: The patient did complain of dysuria on the day of discharge, however a urinalysis was normal.  This possibly is related to the pressure of the condom cath, there is no evidence of infection on the day of discharge.  Consultants: None  Physical Exam/Data:   Vitals:   12/15/18 2021 12/16/18 0511 12/16/18 0634 12/16/18 1049  BP: 117/69 114/67  124/83  Pulse: 94 81  95  Resp: 20 20    Temp: 99.7 F (37.6 C) 98.4 F (36.9 C)    TempSrc: Oral Oral    SpO2: 100% 100%  100%  Weight:   112.5 kg   Height:         Intake/Output Summary (Last 24 hours) at 12/16/2018 1346 Last data filed at 12/16/2018 0902 Gross per 24 hour  Intake 1330 ml  Output 900 ml  Net 430 ml    Last 3 Weights 12/16/2018 12/15/2018 12/13/2018  Weight (lbs) 248 lb 0.3 oz 246 lb 4.1 oz 237 lb 7 oz  Weight (kg) 112.5 kg 111.7 kg 107.7 kg    Body mass index is 31 kg/m.  General: Well nourished, well developed, in no acute distress Head: Atraumatic, normal size  Eyes: PEERLA, EOMI  Neck: Supple, no JVD Endocrine: No thryomegaly Cardiac: Regular rhythm, no murmurs Lungs: Diminished breath sounds at the bases bilaterally Abd: Soft, nontender, no hepatomegaly  Ext: No edema, pulses  2+ Musculoskeletal: No deformities, weakness noted in upper and lower extremities Skin: Extensive hematomas in the left axilla extending into the left flank, also similar-appearing but less severe on the right Neuro: Alert and oriented to person, place, time, and situation, CNII-XII grossly intact, no focal deficits  Psych: Normal mood and affect   Labs & Radiologic Studies  CBC Recent Labs    12/15/18 2134 12/16/18 0423  WBC 10.1 8.9  HGB 8.9* 9.0*  HCT 26.5* 28.3*  MCV 89.2 91.0  PLT 174 027   Basic Metabolic Panel Recent Labs    12/15/18 0424 12/16/18 0423  NA 135 137  K 3.7 3.4*  CL 96* 98  CO2 29 31  GLUCOSE 120* 105*  BUN 27* 22  CREATININE 1.22 1.05  CALCIUM 8.1* 8.3*   Liver Function Tests No results for input(s): AST, ALT, ALKPHOS, BILITOT, PROT, ALBUMIN in the last 72 hours. No results for input(s): LIPASE, AMYLASE in the  last 72 hours. Cardiac Enzymes No results for input(s): CKTOTAL, CKMB, CKMBINDEX, TROPONINI in the last 72 hours. BNP Invalid input(s): POCBNP D-Dimer No results for input(s): DDIMER in the last 72 hours. Hemoglobin A1C No results for input(s): HGBA1C in the last 72 hours. Fasting Lipid Panel No results for input(s): CHOL, HDL, LDLCALC, TRIG, CHOLHDL, LDLDIRECT in the last 72 hours. Thyroid Function Tests No results for input(s): TSH, T4TOTAL, T3FREE, THYROIDAB in the last 72 hours.  Invalid input(s): FREET3 _____________  Dg Chest 2 View  Result Date: 12/08/2018 CLINICAL DATA:  Reason for exam: cp/sob/edema Patient reports came to hospital last week for similar symptoms. Reports sob, left chest pains, left lower leg swelling. EXAM: CHEST - 2 VIEW COMPARISON:  Chest radiograph 03/28/2018, 02/17/2018 FINDINGS: Stable cardiomediastinal contours status post median sternotomy. The heart size is enlarged. Chronic elevation of the right hemidiaphragm. There is a band like linear opacity at the right lung base likely representing atelectasis.  The left lung is clear. No pneumothorax or pleural effusion. No acute finding in the visualized skeleton. IMPRESSION: Bandlike opacity at the right lung base likely representing atelectasis. No focal infiltrate or evidence of edema. Electronically Signed   By: Audie Pinto M.D.   On: 12/08/2018 15:40   Dg Chest 2 View  Result Date: 11/27/2018 CLINICAL DATA:  Shortness of breath. EXAM: CHEST - 2 VIEW COMPARISON:  02/17/2018 FINDINGS: Stable asymmetric elevation right hemidiaphragm. Right base chronic atelectasis/scarring is similar to prior. No pulmonary edema or focal airspace consolidation. Cardiopericardial silhouette is at upper limits of normal for size. The visualized bony structures of the thorax are intact. IMPRESSION: Stable.  No acute findings. Electronically Signed   By: Misty Stanley M.D.   On: 11/27/2018 16:10   Ct Head Wo Contrast  Result Date: 12/14/2018 CLINICAL DATA:  Altered mental status EXAM: CT HEAD WITHOUT CONTRAST TECHNIQUE: Contiguous axial images were obtained from the base of the skull through the vertex without intravenous contrast. COMPARISON:  04/06/2017 FINDINGS: Brain: Mild atrophic and chronic white matter ischemic changes are identified. No findings to suggest acute hemorrhage, acute infarction or space-occupying mass lesion are noted. Vascular: No hyperdense vessel or unexpected calcification. Skull: Normal. Negative for fracture or focal lesion. Sinuses/Orbits: No acute finding. Other: None. IMPRESSION: Chronic atrophic and ischemic changes without acute abnormality. Electronically Signed   By: Inez Catalina M.D.   On: 12/14/2018 17:09   Dg Chest Port 1 View  Result Date: 12/14/2018 CLINICAL DATA:  Shortness of breath EXAM: PORTABLE CHEST 1 VIEW COMPARISON:  12/12/2018 FINDINGS: Previous median sternotomy and CABG as before. Cardiomegaly and atherosclerosis, unchanged. Persistent elevation of right hemidiaphragm. Scarring and/or atelectasis at the right base is  unchanged. IMPRESSION: Stable elevation of right hemidiaphragm following median sternotomy with juxta diaphragmatic atelectasis and scarring. No acute finding. Electronically Signed   By: Zetta Bills M.D.   On: 12/14/2018 13:30   Dg Chest Port 1 View  Result Date: 12/12/2018 CLINICAL DATA:  Hematoma on left side of head. EXAM: PORTABLE CHEST 1 VIEW COMPARISON:  12/08/2018 FINDINGS: Previous median sternotomy and CABG. Cardiac enlargement. Asymmetric elevation of the right hemidiaphragm, unchanged. Scarring identified within the right base, similar to previous exam. No superimposed pleural effusion, airspace consolidation or atelectasis. IMPRESSION: 1. No acute cardiopulmonary abnormalities. 2. Cardiac enlargement. Electronically Signed   By: Kerby Moors M.D.   On: 12/12/2018 20:06   Vas [place] Lower Extremity Venous (dvt) (only Mc & Wl 7a-7p)  Result Date: 11/28/2018  Lower Venous Study Indications:  Swelling, SOB, and Patient is on Coumadin.  Risk Factors: Rapid atrial fibrillation. Comparison Study: No prior study on file Performing Technologist: Sharion Dove RVS  Examination Guidelines: A complete evaluation includes B-mode imaging, spectral Doppler, color Doppler, and power Doppler as needed of all accessible portions of each vessel. Bilateral testing is considered an integral part of a complete examination. Limited examinations for reoccurring indications may be performed as noted.  +-----+---------------+---------+-----------+----------+-------------------+  RIGHT Compressibility Phasicity Spontaneity Properties Thrombus Aging       +-----+---------------+---------+-----------+----------+-------------------+  CFV   Full                                             pulsatile waveforms  +-----+---------------+---------+-----------+----------+-------------------+   +---------+---------------+---------+-----------+----------+-------------------+  LEFT       Compressibility Phasicity Spontaneity Properties Thrombus Aging       +---------+---------------+---------+-----------+----------+-------------------+  CFV       Full                                             pulsatile waveforms  +---------+---------------+---------+-----------+----------+-------------------+  SFJ       Full                                                                  +---------+---------------+---------+-----------+----------+-------------------+  FV Prox   Full                                                                  +---------+---------------+---------+-----------+----------+-------------------+  FV Mid    Full                                                                  +---------+---------------+---------+-----------+----------+-------------------+  FV Distal Full                                                                  +---------+---------------+---------+-----------+----------+-------------------+  PFV       Full                                                                  +---------+---------------+---------+-----------+----------+-------------------+  POP       Full  pulsatile waveforms  +---------+---------------+---------+-----------+----------+-------------------+  PTV       Full                                                                  +---------+---------------+---------+-----------+----------+-------------------+  PERO      Full                                                                  +---------+---------------+---------+-----------+----------+-------------------+     Summary: Right: No evidence of common femoral vein obstruction. Left: There is no evidence of deep vein thrombosis in the lower extremity.  *See table(s) above for measurements and observations. Electronically signed by Servando Snare MD on 11/28/2018 at 64:37:04 AM.    Final    Disposition  Pt is being discharged home today in good  condition.  Follow-up Plans & Appointments   Discharge Instructions    Diet - low sodium heart healthy   Complete by: As directed    Increase activity slowly   Complete by: As directed       Discharge Medications   Allergies as of 12/16/2018   No Known Allergies     Medication List    STOP taking these medications   allopurinol 100 MG tablet Commonly known as: ZYLOPRIM   benazepril 5 MG tablet Commonly known as: LOTENSIN   fluocinonide gel 0.05 % Commonly known as: LIDEX   isosorbide mononitrate 30 MG 24 hr tablet Commonly known as: IMDUR   warfarin 2.5 MG tablet Commonly known as: COUMADIN     TAKE these medications   Accu-Chek Guide test strip Generic drug: glucose blood USE UP TO FOUR TIMES DAILY AS DIRECTED   atorvastatin 40 MG tablet Commonly known as: LIPITOR Take 1 tablet (40 mg total) by mouth daily.   blood glucose meter kit and supplies Kit Dispense based on patient and insurance preference. Use up to four times daily as directed. (FOR ICD-9 250.00, 250.01).   cyanocobalamin 1000 MCG tablet Take 1 tablet (1,000 mcg total) by mouth daily. What changed: how much to take   diltiazem 240 MG 24 hr capsule Commonly known as: CARDIZEM CD Take 1 capsule (240 mg total) by mouth 2 (two) times daily. What changed:   medication strength  how much to take  when to take this   ferrous sulfate 325 (65 FE) MG tablet Take 1 tablet (325 mg total) by mouth 2 (two) times daily with a meal.   fluticasone 50 MCG/ACT nasal spray Commonly known as: FLONASE Place 2 sprays into both nostrils daily. What changed:   when to take this  reasons to take this   folic acid 1 MG tablet Commonly known as: FOLVITE Take 1 tablet (1 mg total) by mouth daily. Start taking on: December 17, 2018   furosemide 40 MG tablet Commonly known as: LASIX Take 1 tablet (40 mg total) by mouth daily as needed for edema (edema and weight gain). What changed:   when to take  this  reasons to take this   levothyroxine 112 MCG tablet  Commonly known as: SYNTHROID TAKE 1 TABLET (112 MCG TOTAL) BY MOUTH DAILY BEFORE BREAKFAST.   metFORMIN 500 MG tablet Commonly known as: GLUCOPHAGE TAKE 1 TABLET (500 MG TOTAL) DAILY WITH BREAKFAST BY MOUTH.   MULTIVITAMIN GUMMIES MENS PO Take 2 tablets by mouth daily with breakfast.   nitroGLYCERIN 0.4 MG SL tablet Commonly known as: NITROSTAT Place 1 tablet (0.4 mg total) under the tongue every 5 (five) minutes as needed for chest pain. Up to 3 doses   OXYGEN Inhale 2 L into the lungs at bedtime.   pantoprazole 40 MG tablet Commonly known as: PROTONIX TAKE 1 TABLET (40 MG TOTAL) EVERY MORNING BY MOUTH.   senna-docusate 8.6-50 MG tablet Commonly known as: Senokot-S Take 2 tablets by mouth 2 (two) times daily.   traMADol 50 MG tablet Commonly known as: ULTRAM TAKE 1 TABLET BY MOUTH TWICE A DAY What changed: Another medication with the same name was removed. Continue taking this medication, and follow the directions you see here.        Acute coronary syndrome (MI, NSTEMI, STEMI, etc) this admission?: No.    Outstanding Labs/Studies   Would recommend to check a CBC within 2 days of discharge.  Duration of Discharge Encounter   Greater than 30 minutes including physician time.  Signed, Addison Naegeli. Audie Box, Bee  12/16/2018 1:46 PM

## 2018-12-16 NOTE — Progress Notes (Signed)
Meredith Staggers, MD  Physician  Physical Medicine and Rehabilitation  PMR Pre-admission  Signed  Date of Service:  12/16/2018 12:02 PM      Related encounter: ED to Hosp-Admission (Current) from 12/08/2018 in [facility]      Signed         Show:Clear all [x] Manual[x] Template[x] Copied  Added by: [x] Meredith Staggers, MD[x] Michel Santee, PT  [] Hover for details PMR Admission Coordinator Pre-Admission Assessment  Patient: [name] is an 73 y.o., male MRN: [id number] DOB: [birth year] Height: 6' 3"  (190.5 cm) Weight: 112.5 kg  Insurance Information HMO:     PPO:      PCP:      IPA:      80/20:      OTHER:  PRIMARY: Medicare A and B      Policy#: [id number]      Subscriber: patient CM Name:       Phone#:      Fax#:  Pre-Cert#: verified eligibility online      Employer:  Benefits:  Phone #:      Name:  Eff. Date: 02/14/01 A and B     Deduct: $1408      Out of Pocket Max: n/a      Life Max: n/a CIR: 100%      SNF: 20 full days Outpatient:      Co-Pay:  Home Health:       Co-Pay:  DME:      Co-Pay:  Providers:  SECONDARY:       Policy#:       Subscriber:  CM Name:       Phone#:      Fax#:  Pre-Cert#:       Employer:  Benefits:  Phone #:      Name:  Eff. Date:      Deduct:       Out of Pocket Max:       Life Max:  CIR:       SNF:  Outpatient:      Co-Pay:  Home Health:       Co-Pay:  DME:      Co-Pay:   Medicaid Application Date:       Case Manager:  Disability Application Date:       Case Worker:   The "Data Collection Information Summary" for patients in Inpatient Rehabilitation Facilities with attached "Privacy Act [place] Records" was provided and verbally reviewed with: Patient and Family  Emergency Contact Information         Contact Information    Name Relation Home Work [place] Spouse [phone]  [phone]   [facility] Daughter   [phone]      Current Medical  History  Patient Admitting Diagnosis: debility   History of Present Illness: [name] a 73 year old right-handed male with history of congestive heart failure, hypertension, CAD with CABG 2004, CVA with residual left-sided weakness, diabetes mellitus, OSA on nighttime oxygen 2 L and persistent atrial fibrillation on Coumadin therapy. Presented 12/09/2018 with recurrent atrial fibrillation, shortness of breath and altered mental status and attempts made at cardioversion unsuccessful placed on IV diltiazem drip. INR on admission of 2.0. Echocardiogram did not reveal any ischemic changes. Patient remained on IV heparin as well as Coumadin. Rapid response 12/12/2018 for mild disorientation and dysarthria as well as large hematoma left lateral chest.. No trauma noted. Suspect spontaneous chest wall hematoma per cardiology services and heparin and warfarin were  discontinued. Hemoglobin dipped to 6.8 he was transfused 2 units of packed red blood cells latest hemoglobin 9.0. Cranial CT scan of the head was completed 12/14/2018 due to altered mental status showed no acute changes as well as ABGs unremarkable. Patient would remain off anticoagulation at this time consideration for watchman device and anticoagulation could be readdressed as outpatient. Tolerating a regular diet. Patient was admitted for a comprehensive rehab program.    Patient's medical record from [facility] has been reviewed by the rehabilitation admission coordinator and physician.  Past Medical History      Past Medical History:  Diagnosis Date  . Acute encephalopathy 12/12/2015  . Acute respiratory failure with hypoxia and hypercapnia ([place]) 12/12/2015  . Allergic rhinitis 02/13/2010   Qualifier: Diagnosis of  By: Wynona Luna   . Angina at rest [facility]) 10/22/2015  . Ataxia 08/10/2013  . Atrial fibrillation ([place]) 11/2018  . B12 deficiency 08/11/2013  . BACK PAIN, LUMBAR 01/29/2009   Qualifier: Diagnosis  of  By: Wynona Luna   . BENIGN POSITIONAL VERTIGO 11/23/2009   Qualifier: Diagnosis of  By: Wynona Luna   . Chronic diastolic heart failure ([place]) 04/24/2015  . Chronic pain    left sided-[place]  . Coronary 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 ([place]) 11/04/2015   s/p CABG 2004; CFX DES 2011  . Essential hypertension 12/12/2006   Qualifier: Diagnosis of  By: [name] RMA, Lucy    . GERD 12/12/2006   Qualifier: Diagnosis of  By: [name] RMA, Lucy    . GOUT 12/12/2006   Qualifier: Diagnosis of  By: Reatha Armour, Lucy    . Hyperlipidemia LDL goal <70 12/12/2006   Qualifier: Diagnosis of  By: [name] RMA, Lucy     . Hypogonadism male 04/10/2011  . Hypothyroidism, acquired 02/15/2013  . Insomnia 05/21/2013  . Long term current use of anticoagulant therapy 06/24/2011  . Obstructive sleep apnea-Failed CPAP 06/17/2007   Failed CPAP Using O 2 for sleep   . Paroxysmal atrial fibrillation ([place]) 12/06/2016  . 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 Thoracic aorta atherosclerosis ([place]) 04/27/2017  . Tubular adenoma of colon 05/2010  . Type 2 diabetes mellitus without complication, without long-term current use of insulin ([place]) 10/05/2007   Qualifier: Diagnosis of  By: Wynona Luna   . Vasovagal syncope     Family History   family history includes Lung cancer in his father; Stroke in his mother.  Prior Rehab/Hospitalizations Has the patient had prior rehab or hospitalizations prior to admission? Yes  Has the patient had major surgery during 100 days prior to admission? No              Current Medications  Current Facility-Administered Medications:  .  0.9 %  sodium chloride infusion (Manually program via Guardrails IV Fluids), , Intravenous, Once, O'Neal, Cassie Freer, MD .  0.9 %  sodium chloride infusion, 250 mL, Intravenous, Continuous, [facility], Tami Lin, [place], Stopped at 12/10/18 1123 .  acetaminophen  (TYLENOL) tablet 650 mg, 650 mg, Oral, Q4H PRN, Croitoru, Mihai, MD, 650 mg at 12/12/18 0636 .  atorvastatin (LIPITOR) tablet 40 mg, 40 mg, Oral, Daily, Croitoru, Mihai, MD, 40 mg at 12/16/18 1059 .  diltiazem (CARDIZEM CD) 24 hr capsule 240 mg, 240 mg, Oral, BID, O'Neal, Cassie Freer, MD, 240 mg at 12/16/18 1058 .  ferrous sulfate tablet 325 mg, 325 mg, Oral, BID  WC, O'Neal, Cassie Freer, MD, 325 mg at 12/16/18 1059 .  folic acid (FOLVITE) tablet 1 mg, 1 mg, Oral, Daily, O'Neal, Cassie Freer, MD, 1 mg at 12/16/18 1059 .  insulin aspart (novoLOG) injection 0-15 Units, 0-15 Units, Subcutaneous, TID WC, O'Neal, Cassie Freer, MD, 2 Units at 12/16/18 1301 .  levothyroxine (SYNTHROID) tablet 112 mcg, 112 mcg, Oral, QAC breakfast, Kathyrn Drown D, NP, 112 mcg at 12/16/18 7782 .  MEDLINE mouth rinse, 15 mL, Mouth Rinse, BID, Croitoru, Mihai, MD, 15 mL at 12/16/18 1302 .  ondansetron (ZOFRAN) injection 4 mg, 4 mg, Intravenous, Q6H PRN, Croitoru, Mihai, MD .  senna-docusate (Senokot-S) tablet 1 tablet, 1 tablet, Oral, QHS PRN, Consuelo Pandy, PA-C, 1 tablet at 12/16/18 1059 .  sodium chloride flush (NS) 0.9 % injection 3 mL, 3 mL, Intravenous, Q12H, [facility], Tami Lin, PA, 3 mL at 12/16/18 1059 .  sodium chloride flush (NS) 0.9 % injection 3 mL, 3 mL, Intravenous, PRN, Ledora Bottcher, PA, 3 mL at 12/11/18 2218 .  traMADol (ULTRAM) tablet 50 mg, 50 mg, Oral, Q12H PRN, O'Neal, Cassie Freer, MD .  zolpidem [facility]) tablet 5 mg, 5 mg, Oral, QHS PRN, [name] Maw, MD, 5 mg at 12/15/18 2133  Patients Current Diet:     Diet Order                  Diet - low sodium heart healthy         Diet Heart Room service appropriate? Yes; Fluid consistency: Thin  Diet effective now               Precautions / Restrictions Precautions Precautions: Fall Restrictions Weight Bearing Restrictions: No   Has the patient had 2 or more falls or a fall with injury in the past year? No   Prior Activity Level Community (5-7x/wk): went out daily for a few hours, RW for mobility, still driving  Prior Functional Level Self Care: Did the patient need help bathing, dressing, using the toilet or eating? Independent  Indoor Mobility: Did the patient need assistance with walking from room to room (with or without device)? Independent  Stairs: Did the patient need assistance with internal or external stairs (with or without device)? Independent  Functional Cognition: Did the patient need help planning regular tasks such as shopping or remembering to take medications? Independent  Home Assistive Devices / Equipment Home Assistive Devices/Equipment: Gilford Rile (specify type), Eyeglasses  Prior Device Use: Indicate devices/aids used by the patient prior to current illness, exacerbation or injury? Walker  Current Functional Level Cognition  Overall Cognitive Status: No family/caregiver present to determine baseline cognitive functioning Orientation Level: Oriented X4 Following Commands: Follows one step commands consistently Safety/Judgement: Decreased awareness of deficits General Comments: pt easily distracted by phone and needs cues to remain attentive to task    Extremity Assessment (includes Sensation/Coordination)  Upper Extremity Assessment: Generalized weakness  Lower Extremity Assessment: Generalized weakness    ADLs  Overall ADL's : Needs assistance/impaired Eating/Feeding: Set up, Sitting Grooming: Set up, Sitting Upper Body Bathing: Set up, Sitting Lower Body Bathing: Moderate assistance, +2 for physical assistance, Sit to/from stand Upper Body Dressing : Set up, Sitting Lower Body Dressing: Moderate assistance, +2 for physical assistance, Sit to/from stand Lower Body Dressing Details (indicate cue type and reason): assist to don slippers  Toilet Transfer: Moderate assistance, +2 for physical assistance, Stand-pivot, RW Toilet Transfer Details  (indicate cue type and reason): simulated from EOB to recliner Toileting- Clothing Manipulation  and Hygiene: Total assistance Toileting - Clothing Manipulation Details (indicate cue type and reason): heavy reliance on BUE while standing pt would require assist for posterior pericare Functional mobility during ADLs: Moderate assistance, Rolling walker, +2 for physical assistance, +2 for safety/equipment General ADL Comments: pt limited by decreased activity tolerance, weakness, and wob;unable to get clear o2 reading, pt reports minor dizziness while sitting EOB, increased wob with mobiltiy;pt required maxA for pursed lip breathing, pt on 2lnc    Mobility  Overal bed mobility: Needs Assistance Bed Mobility: Supine to Sit Supine to sit: HOB elevated, Min assist, +2 for physical assistance Sit to supine: Max assist General bed mobility comments: HOB 45 degrees with use of rail and cues for sequence with increased time to pivot to right    Transfers  Overall transfer level: Needs assistance Equipment used: Rolling walker (2 wheeled) Transfers: Sit to/from Stand Sit to Stand: Min assist, +2 safety/equipment Stand pivot transfers: Mod assist, +2 physical assistance, +2 safety/equipment General transfer comment: assist to rise with cues for scooting to edge of surface, hand placement x 4 trials from bed and recliner. Cues and assist to control descent to surface    Ambulation / Gait / Stairs / Wheelchair Mobility  Ambulation/Gait Ambulation/Gait assistance: Min assist, +2 safety/equipment Gait Distance (Feet): 12 Feet Assistive device: Rolling walker (2 wheeled) Gait Pattern/deviations: Step-through pattern, Decreased stride length, Trunk flexed General Gait Details: cues for posture, looking up, proximity to RW and chair follow with pt able to walk 5', 12', 12' with close chair follow. SpO2 dropping to 88% on 2L with gait with seated rest return to 98% Gait velocity: decreased Gait  velocity interpretation: <1.8 ft/sec, indicate of risk for recurrent falls    Posture / Balance Dynamic Sitting Balance Sitting balance - Comments: supervision EOB Balance Overall balance assessment: Needs assistance Sitting-balance support: Feet supported, Bilateral upper extremity supported Sitting balance-Leahy Scale: Fair Sitting balance - Comments: supervision EOB Standing balance support: Bilateral upper extremity supported Standing balance-Leahy Scale: Poor Standing balance comment: heavy reliance on BUE    Special needs/care consideration BiPAP/CPAP no CPM no Continuous Drip IV no Dialysis no        Days n/a Life Vest no Oxygen 2L at baseline Special Bed no Trach Size no Wound Vac (area) no      Location n/a Skin abrasion to back, ecchymosis to chest/abd, excoriated to BUEs/BLEs                             Bowel mgmt: continent, 9/30 last BM Bladder mgmt: incontinent Diabetic mgmt: yes Behavioral consideration no Chemo/radiation no   Previous Home Environment (from acute therapy documentation) Living Arrangements: Spouse/significant other(pt tells me he has 24 hour support) Available Help at Discharge: Family Type of Home: House Home Layout: One level Home Access: Stairs to enter Entrance Stairs-Rails: Left Entrance Stairs-Number of Steps: 3-4 Bathroom Shower/Tub: Multimedia programmer: Handicapped height Home Care Services: No Additional Comments: pt reports a fully accessible home  Discharge Living Setting Plans for Discharge Living Setting: Patient's home Type of Home at Discharge: House Discharge Home Layout: One level Discharge Home Access: Stairs to enter Entrance Stairs-Rails: Left Entrance Stairs-Number of Steps: 3 Discharge Bathroom Shower/Tub: Tub/shower unit Discharge Bathroom Toilet: Standard Discharge Bathroom Accessibility: Yes How Accessible: Accessible via walker Does the patient have any problems obtaining your medications?:  No  Social/Family/Support Systems Anticipated Caregiver: wife, daughter, and adult grandkids Anticipated Caregiver's Contact Information:  Velta Addison (dtr) [phone]; [name] (wife, [facility]) [phone] Caregiver Availability: 24/7 Discharge Plan Discussed with Primary Caregiver: Yes Is Caregiver In Agreement with Plan?: Yes Does Caregiver/Family have Issues with Lodging/Transportation while Pt is in Rehab?: No  Goals/Additional Needs Patient/Family Goal for Rehab: PT/OT supervision Expected length of stay: 12-14 days Dietary Needs: heart healthy Additional Information: 2L o2 at baseline Pt/Family Agrees to Admission and willing to participate: Yes Program Orientation Provided & Reviewed with Pt/Caregiver Including Roles  & Responsibilities: Yes  Decrease burden of Care through IP rehab admission: n/a  Possible need for SNF placement upon discharge: not anticipated  Patient Condition: I have reviewed medical records from [facility], spoken with CM, and patient, spouse and daughter. I met with patient at the bedside for inpatient rehabilitation assessment.  Patient will benefit from ongoing PT and OT, can actively participate in 3 hours of therapy a day 5 days of the week, and can make measurable gains during the admission.  Patient will also benefit from the coordinated team approach during an Inpatient Acute Rehabilitation admission.  The patient will receive intensive therapy as well as Rehabilitation physician, nursing, social worker, and care management interventions.  Due to bladder management, safety, skin/wound care, disease management, medication administration, pain management and patient education the patient requires 24 hour a day rehabilitation nursing.  The patient is currently mod assist with mobility and basic ADLs.  Discharge setting and therapy post discharge at home with home health is anticipated.  Patient has agreed to participate in the Acute Inpatient  Rehabilitation Program and will admit today.  Preadmission Screen Completed By:  Michel Santee, 12/16/2018 1:44 PM ______________________________________________________________________   Discussed status with Dr. Naaman Plummer on 12/16/18  at 1:44 PM  and received approval for admission today.  Admission Coordinator:  Michel Santee, PT, time 1:44 PM Sudie Grumbling 12/16/18    Assessment/Plan: Diagnosis: debility and encephalopathy 1. Does the need for close, 24 hr/day Medical supervision in concert with the patient's rehab needs make it unreasonable for this patient to be served in a less intensive setting? Yes 2. Co-Morbidities requiring supervision/potential complications: chf, htn, cad, dm 3. Due to bladder management, bowel management, safety, skin/wound care, disease management, medication administration, pain management and patient education, does the patient require 24 hr/day rehab nursing? Yes 4. Does the patient require coordinated care of a physician, rehab nurse, PT (1-2 hrs/day, 5 days/week), OT (1-2 hrs/day, 5 days/week) and SLP (1-2 hrs/day, 5 days/week) to address physical and functional deficits in the context of the above medical diagnosis(es)? Yes Addressing deficits in the following areas: balance, endurance, locomotion, strength, transferring, bowel/bladder control, bathing, dressing, feeding, grooming, toileting, cognition and psychosocial support 5. Can the patient actively participate in an intensive therapy program of at least 3 hrs of therapy 5 days a week? Yes 6. The potential for patient to make measurable gains while on inpatient rehab is excellent 7. Anticipated functional outcomes upon discharge from inpatients are: supervision PT, supervision OT, modified independent and supervision SLP 8. Estimated rehab length of stay to reach the above functional goals is: 12-14 days 9. Anticipated D/C setting: Home 10. Anticipated post D/C treatments: [place] therapy 11. Overall  Rehab/Functional Prognosis: excellent  MD Signature: Meredith Staggers, MD, [facility] 12/16/2018         Revision History

## 2018-12-16 NOTE — H&P (Signed)
Physical Medicine and Rehabilitation Admission H&P     HPI: [name] is a 73 year old right-handed male with history of congestive heart failure, hypertension, CAD with CABG 2004, CVA with residual left-sided weakness, diabetes mellitus, OSA on nighttime oxygen 2 L and persistent atrial fibrillation on Coumadin therapy.  Per chart review patient lives with spouse.  1 level home 3 steps to entry.  Reportedly independent prior to admission.  Presented 12/09/2018 with recurrent atrial fibrillation, shortness of breath and altered mental status and attempts made at cardioversion unsuccessful placed on IV diltiazem drip.  INR on admission of 2.0.  Echocardiogram did not reveal any ischemic changes.  Patient remained on IV heparin as well as Coumadin.  Rapid response 12/12/2018 for mild disorientation and dysarthria as well as large hematoma left lateral chest..  No trauma noted.  Suspect spontaneous chest wall hematoma per cardiology services and heparin and warfarin were discontinued.  Hemoglobin dipped to 6.8 he was transfused 2 units of packed red blood cells latest hemoglobin 9.0.  Cranial CT scan of the head was completed 12/14/2018 due to altered mental status showed no acute changes as well as ABGs unremarkable.  Patient would remain off anticoagulation at this time consideration for watchman device and anticoagulation could be readdressed as outpatient.  Tolerating a regular diet.  Patient was admitted for a comprehensive rehab program.  Review of Systems  Constitutional: Positive for malaise/fatigue. Negative for chills and fever.  HENT: Negative for hearing loss.   Eyes: Negative for blurred vision and double vision.  Respiratory: Negative for cough and shortness of breath.   Cardiovascular: Positive for palpitations and leg swelling. Negative for chest pain.  Gastrointestinal: Positive for constipation. Negative for heartburn and nausea.       GERD  Genitourinary: Negative for dysuria,  flank pain and hematuria.  Musculoskeletal: Positive for back pain and myalgias.  Skin: Negative for rash.  Neurological:       Chronic left-sided weakness from history of CVA  Psychiatric/Behavioral: The patient has insomnia.   All other systems reviewed and are negative.  Past Medical History:  Diagnosis Date  . Acute encephalopathy 12/12/2015  . Acute respiratory failure with hypoxia and hypercapnia ([place]) 12/12/2015  . Allergic rhinitis 02/13/2010   Qualifier: Diagnosis of  By: Wynona Luna   . Angina at rest [facility]) 10/22/2015  . Ataxia 08/10/2013  . Atrial fibrillation ([place]) 11/2018  . B12 deficiency 08/11/2013  . BACK PAIN, LUMBAR 01/29/2009   Qualifier: Diagnosis of  By: Wynona Luna   . BENIGN POSITIONAL VERTIGO 11/23/2009   Qualifier: Diagnosis of  By: Wynona Luna   . Chronic diastolic heart failure ([place]) 04/24/2015  . Chronic pain    left sided-[place]  . Coronary 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 ([place]) 11/04/2015   s/p CABG 2004; CFX DES 2011  . Essential hypertension 12/12/2006   Qualifier: Diagnosis of  By: [name] RMA, Lucy    . GERD 12/12/2006   Qualifier: Diagnosis of  By: [name] RMA, Lucy    . GOUT 12/12/2006   Qualifier: Diagnosis of  By: Reatha Armour, Lucy    . Hyperlipidemia LDL goal <70 12/12/2006   Qualifier: Diagnosis of  By: [name] RMA, Lucy     . Hypogonadism male 04/10/2011  . Hypothyroidism, acquired 02/15/2013  . Insomnia 05/21/2013  . Long term current use of anticoagulant therapy 06/24/2011  . Obstructive sleep apnea-Failed CPAP 06/17/2007   Failed CPAP Using O  2 for sleep   . Paroxysmal atrial fibrillation ([place]) 12/06/2016  . Stroke (cerebrum) ([place]) 01/13/2017   also 1996; resultant hemiplegia of dominant side  . Thoracic aorta atherosclerosis ([place]) 04/27/2017  . Tubular adenoma of colon 05/2010  . Type 2 diabetes mellitus without complication, without long-term current use of insulin ([place]) 10/05/2007   Qualifier: Diagnosis of   By: Wynona Luna   . Vasovagal syncope    Past Surgical History:  Procedure Laterality Date  . CARDIAC CATHETERIZATION N/A 10/25/2015   Procedure: Left Heart Cath and Cors/Grafts Angiography;  Surgeon: Troy Sine, MD;  Location: [facility];  Service: Cardiovascular;  Laterality: N/A;  . CARDIOVERSION  06/20/2011   Procedure: CARDIOVERSION;  Surgeon: Larey Dresser, MD;  Location: [facility] ENDOSCOPY;  Service: Cardiovascular;  Laterality: N/A;  . CARDIOVERSION N/A 04/26/2015   Procedure: CARDIOVERSION;  Surgeon: Dorothy Spark, MD;  Location: [facility] ENDOSCOPY;  Service: Cardiovascular;  Laterality: N/A;  . CARDIOVERSION N/A 05/10/2015   Procedure: CARDIOVERSION;  Surgeon: Larey Dresser, MD;  Location: [facility];  Service: Cardiovascular;  Laterality: N/A;  . CARDIOVERSION N/A 12/10/2018   Procedure: CARDIOVERSION;  Surgeon: Sanda Klein, MD;  Location: [facility];  Service: Cardiovascular;  Laterality: N/A;  . CATARACT EXTRACTION  05/2010   left eye  . CATARACT EXTRACTION  04/2010   right eye  . CORONARY ARTERY BYPASS GRAFT     stent  . ESOPHAGOGASTRODUODENOSCOPY  03-25-2005  . LEFT HEART CATH AND CORS/GRAFTS ANGIOGRAPHY N/A 02/19/2018   Procedure: LEFT HEART CATH AND CORS/GRAFTS ANGIOGRAPHY;  Surgeon: Martinique, Peter M, MD;  Location: [place] [facility];  Service: Cardiovascular;  Laterality: N/A;  . NASAL SEPTUM SURGERY    . PERCUTANEOUS PLACEMENT INTRAVASCULAR STENT CERVICAL CAROTID ARTERY     03-2009; using a drug-eluting platform of the circumflex cornoray artery with a 3.0 x 18 [employer] Promus drug-eluting platform post dilated to 3.75 with a noncompliant balloon.  . TEE WITHOUT CARDIOVERSION  06/20/2011   Procedure: TRANSESOPHAGEAL ECHOCARDIOGRAM (TEE);  Surgeon: Larey Dresser, MD;  Location: [facility] ENDOSCOPY;  Service: Cardiovascular;  Laterality: N/A;   Family History  Problem Relation Age of Onset  . Lung cancer Father        deceased  . Stroke Mother         deceased-MINISTROKES   Social History:  reports that he has never smoked. He has never used smokeless tobacco. He reports that he does not drink alcohol or use drugs. Allergies: No Known Allergies Medications Prior to Admission  Medication Sig Dispense Refill  . allopurinol (ZYLOPRIM) 100 MG tablet TAKE 2 TABLETS BY MOUTH EVERY DAY (Patient taking differently: Take 200 mg by mouth daily. ) 180 tablet 1  . atorvastatin (LIPITOR) 40 MG tablet Take 1 tablet (40 mg total) by mouth daily. 90 tablet 3  . benazepril (LOTENSIN) 5 MG tablet Take 1 tablet (5 mg total) by mouth daily.    Marland Kitchen diltiazem (CARDIZEM CD) 180 MG 24 hr capsule Take 1 capsule (180 mg total) by mouth daily. 30 capsule 1  . fluticasone (FLONASE) 50 MCG/ACT nasal spray Place 2 sprays into both nostrils daily. (Patient taking differently: Place 2 sprays into both nostrils daily as needed for allergies. ) 9.9 g 1  . furosemide (LASIX) 40 MG tablet TAKE 1 TABLET BY MOUTH EVERY OTHER DAY (Patient taking differently: Take 40 mg by mouth every other day. ) 45 tablet 1  . isosorbide mononitrate (IMDUR) 30 MG 24 hr  tablet TAKE 1/2 TABLET BY MOUTH EVERY DAY (Patient taking differently: Take 15 mg by mouth daily. ) 45 tablet 3  . levothyroxine (SYNTHROID, LEVOTHROID) 112 MCG tablet TAKE 1 TABLET (112 MCG TOTAL) BY MOUTH DAILY BEFORE BREAKFAST. 90 tablet 1  . metFORMIN (GLUCOPHAGE) 500 MG tablet TAKE 1 TABLET (500 MG TOTAL) DAILY WITH BREAKFAST BY MOUTH. 90 tablet 1  . Multiple Vitamins-Minerals (MULTIVITAMIN GUMMIES MENS PO) Take 2 tablets by mouth daily with breakfast.     . nitroGLYCERIN (NITROSTAT) 0.4 MG SL tablet Place 1 tablet (0.4 mg total) under the tongue every 5 (five) minutes as needed for chest pain. Up to 3 doses 10 tablet 2  . OXYGEN Inhale 2 L into the lungs at bedtime.    . pantoprazole (PROTONIX) 40 MG tablet TAKE 1 TABLET (40 MG TOTAL) EVERY MORNING BY MOUTH. 90 tablet 1  . senna-docusate (SENOKOT-S) 8.6-50 MG tablet Take 2  tablets by mouth 2 (two) times daily.    . traMADol (ULTRAM) 50 MG tablet TAKE 1 TABLET BY MOUTH TWICE A DAY (Patient taking differently: Take 50 mg by mouth 2 (two) times daily. ) 180 tablet 1  . traMADol (ULTRAM-ER) 200 MG 24 hr tablet TAKE 1 TABLET BY MOUTH EVERY DAY (Patient taking differently: Take 200 mg by mouth every morning. ) 90 tablet 1  . vitamin B-12 1000 MCG tablet Take 1 tablet (1,000 mcg total) by mouth daily. (Patient taking differently: Take 2,000 mcg by mouth daily. ) 30 tablet 0  . warfarin (COUMADIN) 2.5 MG tablet Take 0.5-1 tablets (1.25-2.5 mg total) by mouth See admin instructions. Take 2.'5mg'$  by mouth on MON WED FRI SAT, then take 1.'25mg'$  on [place] today and resume 9/16.    Marland Kitchen ACCU-CHEK GUIDE test strip USE UP TO FOUR TIMES DAILY AS DIRECTED 100 strip 0  . blood glucose meter kit and supplies KIT Dispense based on patient and insurance preference. Use up to four times daily as directed. (FOR ICD-9 250.00, 250.01). 1 each 0  . fluocinonide gel (LIDEX) 0.05 % Apply to scalp once daily as needed (Patient not taking: Reported on 12/09/2018) 60 g 0    Drug Regimen Review Drug regimen was reviewed and remains appropriate with no significant issues identified  Home: Home Living Family/patient expects to be discharged to:: Private residence Living Arrangements: Spouse/significant other(pt tells me he has 24 hour support) Available Help at Discharge: Family Type of Home: House Home Access: Stairs to enter Technical brewer of Steps: 3-4 Entrance Stairs-Rails: Left Home Layout: One level Bathroom Shower/Tub: Multimedia programmer: Handicapped height Additional Comments: pt reports a fully accessible home   Functional History: Prior Function Level of Independence: Independent Comments: typically active, drove and able to walk without AD until 2 weeks ago  Functional Status:  Mobility: Bed Mobility Overal bed mobility: Needs Assistance Bed  Mobility: Supine to Sit Supine to sit: Mod assist Sit to supine: Max assist General bed mobility comments: modA to progress trunk upright Transfers Overall transfer level: Needs assistance Equipment used: Rolling walker (2 wheeled) Transfers: Sit to/from Stand Sit to Stand: Mod assist, +2 physical assistance Stand pivot transfers: Mod assist, +2 physical assistance, +2 safety/equipment General transfer comment: moderate assist to power up;modA for stand-pivot; Ambulation/Gait Ambulation/Gait assistance: Mod assist, +2 physical assistance Gait Distance (Feet): 4 Feet Assistive device: Rolling walker (2 wheeled) Gait Pattern/deviations: Decreased stride length, Step-through pattern, Decreased step length - left, Decreased step length - right, Shuffle General Gait Details: good ability  with transferring to recliner, took 5 steps forward and could not go any further, 5 steps back to recliner. O2 sats and fatigue limit ability to progress Gait velocity: decreased    ADL: ADL Overall ADL's : Needs assistance/impaired Eating/Feeding: Set up, Sitting Grooming: Set up, Sitting Upper Body Bathing: Set up, Sitting Lower Body Bathing: Moderate assistance, +2 for physical assistance, Sit to/from stand Upper Body Dressing : Set up, Sitting Lower Body Dressing: Moderate assistance, +2 for physical assistance, Sit to/from stand Lower Body Dressing Details (indicate cue type and reason): assist to don slippers  Toilet Transfer: Moderate assistance, +2 for physical assistance, Stand-pivot, RW Toilet Transfer Details (indicate cue type and reason): simulated from EOB to recliner Toileting- Clothing Manipulation and Hygiene: Total assistance Toileting - Clothing Manipulation Details (indicate cue type and reason): heavy reliance on BUE while standing pt would require assist for posterior pericare Functional mobility during ADLs: Moderate assistance, Rolling walker, +2 for physical assistance, +2 for  safety/equipment General ADL Comments: pt limited by decreased activity tolerance, weakness, and wob;unable to get clear o2 reading, pt reports minor dizziness while sitting EOB, increased wob with mobiltiy;pt required maxA for pursed lip breathing, pt on 2lnc  Cognition: Cognition Overall Cognitive Status: No family/caregiver present to determine baseline cognitive functioning Orientation Level: Oriented X4 Cognition Arousal/Alertness: Awake/alert Behavior During Therapy: WFL for tasks assessed/performed Overall Cognitive Status: No family/caregiver present to determine baseline cognitive functioning Area of Impairment: Following commands, Safety/judgement, Problem solving Following Commands: Follows one step commands with increased time Safety/Judgement: Decreased awareness of safety Problem Solving: Slow processing, Requires verbal cues, Requires tactile cues, Difficulty sequencing General Comments: Patient focused on wanting to get to IR  Physical Exam: Blood pressure 124/83, pulse 95, temperature 98.4 F (36.9 C), temperature source Oral, resp. rate 20, height _0  (1.905 m), weight 112.5 kg, SpO2 100 %. Physical Exam  Constitutional: No distress.  obese  HENT:  Head: Normocephalic and atraumatic.  Eyes: Pupils are equal, round, and reactive to light. EOM are normal.  Neck: Normal range of motion. Neck supple. No thyromegaly present.  Cardiovascular:  Murmur heard. IRR IRR  Respiratory: Effort normal and breath sounds normal. No respiratory distress. He has no wheezes. He has no rales.  On 2L oxygen  GI: Soft. He exhibits no distension. There is no abdominal tenderness.  Musculoskeletal:        General: Edema present.  Neurological:  Oriented to person, place. Provides biographical information.  Some delays in processing, distracted. Normal language. UE 4/5. LE 3-/5 HF, 3/5 KE and 4/5 ADF/PF. Normal sensory. DTR's 1+      Skin: Skin is warm. He is not diaphoretic.   Bruising over left pec/axilla    Results for orders placed or performed during the hospital encounter of 12/08/18 (from the past 48 hour(s))  Glucose, capillary     Status: Abnormal   Collection Time: 12/14/18 12:19 PM  Result Value Ref Range   Glucose-Capillary 119 (H) 70 - 99 mg/dL  Basic metabolic panel     Status: Abnormal   Collection Time: 12/14/18 12:29 PM  Result Value Ref Range   Sodium 137 135 - 145 mmol/L   Potassium 4.3 3.5 - 5.1 mmol/L   Chloride 97 (L) 98 - 111 mmol/L   CO2 28 22 - 32 mmol/L   Glucose, Bld 128 (H) 70 - 99 mg/dL   BUN 32 (H) 8 - 23 mg/dL   Creatinine, Ser 1.49 (H) 0.61 - 1.24 mg/dL   Calcium 8.4 (  L) 8.9 - 10.3 mg/dL   GFR calc non Af Amer 45 (L) >60 mL/min   GFR calc Af Amer 52 (L) >60 mL/min   Anion gap 12 5 - 15    Comment: Performed at [facility] [address], [place], [place] [address]  CBC     Status: Abnormal   Collection Time: 12/14/18 12:29 PM  Result Value Ref Range   WBC 16.2 (H) 4.0 - 10.5 K/uL   RBC 2.62 (L) 4.22 - 5.81 MIL/uL   Hemoglobin 7.3 (L) 13.0 - 17.0 g/dL   HCT 23.4 (L) 39.0 - 52.0 %   MCV 89.3 80.0 - 100.0 fL   MCH 27.9 26.0 - 34.0 pg   MCHC 31.2 30.0 - 36.0 g/dL   RDW 15.2 11.5 - 15.5 %   Platelets 121 (L) 150 - 400 K/uL   nRBC 0.0 0.0 - 0.2 %    Comment: Performed at [facility], [place] [address], [place], [place] [address]  Blood gas, arterial     Status: Abnormal   Collection Time: 12/14/18 12:56 PM  Result Value Ref Range   O2 Content 2.0 L/min   Delivery systems NASAL CANNULA    pH, Arterial 7.451 (H) 7.350 - 7.450   pCO2 arterial 44.8 32.0 - 48.0 mmHg   pO2, Arterial 81.8 (L) 83.0 - 108.0 mmHg   Bicarbonate 30.7 (H) 20.0 - 28.0 mmol/L   Acid-Base Excess 6.6 (H) 0.0 - 2.0 mmol/L   O2 Saturation 96.1 %   Patient temperature 98.6    Collection site RIGHT RADIAL    Drawn by [id number]    Sample type ARTERIAL DRAW    Allens test (pass/fail) PASS PASS  Glucose, capillary     Status: Abnormal    Collection Time: 12/14/18  4:26 PM  Result Value Ref Range   Glucose-Capillary 140 (H) 70 - 99 mg/dL  Glucose, capillary     Status: Abnormal   Collection Time: 12/14/18  9:10 PM  Result Value Ref Range   Glucose-Capillary 144 (H) 70 - 99 mg/dL  Protime-INR     Status: Abnormal   Collection Time: 12/15/18  4:24 AM  Result Value Ref Range   Prothrombin Time 17.8 (H) 11.4 - 15.2 seconds   INR 1.5 (H) 0.8 - 1.2    Comment: (NOTE) INR goal varies based on device and disease states. Performed at [facility], [place] [address] [place], [place] [address]   Basic metabolic panel     Status: Abnormal   Collection Time: 12/15/18  4:24 AM  Result Value Ref Range   Sodium 135 135 - 145 mmol/L   Potassium 3.7 3.5 - 5.1 mmol/L   Chloride 96 (L) 98 - 111 mmol/L   CO2 29 22 - 32 mmol/L   Glucose, Bld 120 (H) 70 - 99 mg/dL   BUN 27 (H) 8 - 23 mg/dL   Creatinine, Ser 1.22 0.61 - 1.24 mg/dL   Calcium 8.1 (L) 8.9 - 10.3 mg/dL   GFR calc non Af Amer 58 (L) >60 mL/min   GFR calc Af Amer >60 >60 mL/min   Anion gap 10 5 - 15    Comment: Performed at [facility] [address] [place] [address]  CBC     Status: Abnormal   Collection Time: 12/15/18  4:24 AM  Result Value Ref Range   WBC 11.9 (H) 4.0 - 10.5 K/uL   RBC 2.42 (L) 4.22 - 5.81  MIL/uL   Hemoglobin 6.8 (LL) 13.0 - 17.0 g/dL    Comment: REPEATED TO VERIFY THIS CRITICAL RESULT HAS VERIFIED AND BEEN CALLED TO F. JONES,RN BY TERRAN TYSOR ON 09 30 2020 AT 0556, AND HAS BEEN READ BACK.     HCT 21.5 (L) 39.0 - 52.0 %   MCV 88.8 80.0 - 100.0 fL   MCH 28.1 26.0 - 34.0 pg   MCHC 31.6 30.0 - 36.0 g/dL   RDW 15.2 11.5 - 15.5 %   Platelets 167 150 - 400 K/uL   nRBC 0.0 0.0 - 0.2 %    Comment: Performed at [facility], [place] [address], [place], [place] [address]  Glucose, capillary     Status: Abnormal   Collection Time: 12/15/18  6:16 AM  Result Value Ref Range   Glucose-Capillary 115 (H) 70 - 99 mg/dL  Prepare RBC      Status: None   Collection Time: 12/15/18  7:00 AM  Result Value Ref Range   Order Confirmation      ORDER PROCESSED BY BLOOD BANK Performed at [facility], [place] [address], [place], [place] [address]   Type and screen [place]     Status: None   Collection Time: 12/15/18  7:02 AM  Result Value Ref Range   ABO/RH(D) AB POS    Antibody Screen NEG    Sample Expiration 12/18/2018,2359    Unit Number [id number]    Blood Component Type RED CELLS,LR    Unit division 00    Status of Unit ISSUED,FINAL    Transfusion Status OK TO TRANSFUSE    Crossmatch Result Compatible    Unit Number [id number]    Blood Component Type RED CELLS,LR    Unit division 00    Status of Unit ISSUED,FINAL    Transfusion Status OK TO TRANSFUSE    Crossmatch Result      Compatible Performed at [facility], [place] [address] [place], [place] [address]   Glucose, capillary     Status: Abnormal   Collection Time: 12/15/18 11:28 AM  Result Value Ref Range   Glucose-Capillary 160 (H) 70 - 99 mg/dL   Comment 1 Notify RN    Comment 2 Document in Chart   Glucose, capillary     Status: Abnormal   Collection Time: 12/15/18  4:22 PM  Result Value Ref Range   Glucose-Capillary 142 (H) 70 - 99 mg/dL   Comment 1 Notify RN    Comment 2 Document in Chart   CBC     Status: Abnormal   Collection Time: 12/15/18  9:34 PM  Result Value Ref Range   WBC 10.1 4.0 - 10.5 K/uL   RBC 2.97 (L) 4.22 - 5.81 MIL/uL   Hemoglobin 8.9 (L) 13.0 - 17.0 g/dL    Comment: REPEATED TO VERIFY POST TRANSFUSION SPECIMEN    HCT 26.5 (L) 39.0 - 52.0 %   MCV 89.2 80.0 - 100.0 fL   MCH 30.0 26.0 - 34.0 pg   MCHC 33.6 30.0 - 36.0 g/dL   RDW 14.6 11.5 - 15.5 %   Platelets 174 150 - 400 K/uL   nRBC 0.4 (H) 0.0 - 0.2 %    Comment: Performed at [facility], [place] [address], [place], [place] [address]  Glucose, capillary     Status: Abnormal   Collection Time: 12/15/18  9:42 PM  Result Value Ref  Range   Glucose-Capillary 115 (H) 70 - 99  mg/dL  Protime-INR     Status: Abnormal   Collection Time: 12/16/18  4:23 AM  Result Value Ref Range   Prothrombin Time 17.6 (H) 11.4 - 15.2 seconds   INR 1.5 (H) 0.8 - 1.2    Comment: (NOTE) INR goal varies based on device and disease states. Performed at [facility], [place] [address] [place], [place] [address]   Basic metabolic panel     Status: Abnormal   Collection Time: 12/16/18  4:23 AM  Result Value Ref Range   Sodium 137 135 - 145 mmol/L   Potassium 3.4 (L) 3.5 - 5.1 mmol/L   Chloride 98 98 - 111 mmol/L   CO2 31 22 - 32 mmol/L   Glucose, Bld 105 (H) 70 - 99 mg/dL   BUN 22 8 - 23 mg/dL   Creatinine, Ser 1.05 0.61 - 1.24 mg/dL   Calcium 8.3 (L) 8.9 - 10.3 mg/dL   GFR calc non Af Amer >60 >60 mL/min   GFR calc Af Amer >60 >60 mL/min   Anion gap 8 5 - 15    Comment: Performed at [facility] [address] [place], [place] [address]  CBC     Status: Abnormal   Collection Time: 12/16/18  4:23 AM  Result Value Ref Range   WBC 8.9 4.0 - 10.5 K/uL   RBC 3.11 (L) 4.22 - 5.81 MIL/uL   Hemoglobin 9.0 (L) 13.0 - 17.0 g/dL   HCT 28.3 (L) 39.0 - 52.0 %   MCV 91.0 80.0 - 100.0 fL   MCH 28.9 26.0 - 34.0 pg   MCHC 31.8 30.0 - 36.0 g/dL   RDW 14.9 11.5 - 15.5 %   Platelets 182 150 - 400 K/uL   nRBC 0.3 (H) 0.0 - 0.2 %    Comment: Performed at [facility], [place] [address] [place], [place] [address]  Glucose, capillary     Status: Abnormal   Collection Time: 12/16/18  6:44 AM  Result Value Ref Range   Glucose-Capillary 120 (H) 70 - 99 mg/dL   *Note: Due to a large number of results and/or encounters for the requested time period, some results have not been displayed. A complete set of results can be found in Results Review.   Ct Head Wo Contrast  Result Date: 12/14/2018 CLINICAL DATA:  Altered mental status EXAM: CT HEAD WITHOUT CONTRAST TECHNIQUE: Contiguous axial images were obtained from the base of the skull  through the vertex without intravenous contrast. COMPARISON:  04/06/2017 FINDINGS: Brain: Mild atrophic and chronic white matter ischemic changes are identified. No findings to suggest acute hemorrhage, acute infarction or space-occupying mass lesion are noted. Vascular: No hyperdense vessel or unexpected calcification. Skull: Normal. Negative for fracture or focal lesion. Sinuses/Orbits: No acute finding. Other: None. IMPRESSION: Chronic atrophic and ischemic changes without acute abnormality. Electronically Signed   By: Inez Catalina M.D.   On: 12/14/2018 17:09   Dg Chest Port 1 View  Result Date: 12/14/2018 CLINICAL DATA:  Shortness of breath EXAM: PORTABLE CHEST 1 VIEW COMPARISON:  12/12/2018 FINDINGS: Previous median sternotomy and CABG as before. Cardiomegaly and atherosclerosis, unchanged. Persistent elevation of right hemidiaphragm. Scarring and/or atelectasis at the right base is unchanged. IMPRESSION: Stable elevation of right hemidiaphragm following median sternotomy with juxta diaphragmatic atelectasis and scarring. No acute finding. Electronically Signed   By: Zetta Bills M.D.   On: 12/14/2018 13:30       Medical Problem List and Plan: 1.  Acute encephalopathy secondary to multifactorial/atrial fibrillation/spontaneous acute chest wall hematoma secondary to anticoagulation  -admit patient to inpatient rehab today  2.  Antithrombotics: -DVT/anticoagulation: SCD  -antiplatelet therapy: N/A 3. Pain Management: Tramadol as needed 4. Mood: [place] motional support  -antipsychotic agents: N/A 5. Neuropsych: This patient is generally capable of making decisions on his own behalf. 6. Skin/Wound Care: Routine skin checks 7. Fluids/Electrolytes/Nutrition: Routine in and outs with follow-up chemistries 8.  Atrial fibrillation.  Unsuccessful cardioversion.  Continue Cardizem CD 240 mg twice daily.  Follow-up cardiology services.  No plan to resume anticoagulation at this time due to acute chest  wall hematoma  -heart rate controlled at present 9.  Acute blood loss anemia.  Continue iron supplement.  Follow-up CBC 10.  Hyperlipidemia.  Lipitor 11.  Diabetes mellitus.  Hemoglobin A1c 6.9.  SSI/Glucophage 500 mg daily  -monitor cbg's ac/hs  -adjust regimen as needed 12.  CAD with CABG 2004.  No chest pain or increasing shortness of breath. 13.  History of CVA with left-sided residual weakness.  Again no anticoagulation at this time 14.  Hypothyroidism.  Synthroid 15.  OSA.  Patient did use 2 L oxygen nightly prior to admission. 16.Hyperlipidemia.Lipitor    Lavon Paganini Angiulli, PA-C 12/16/2018

## 2018-12-16 NOTE — H&P (Signed)
Physical Medicine and Rehabilitation Admission H&P       HPI: [name] is a 73 year old right-handed male with history of congestive heart failure, hypertension, CAD with CABG 2004, CVA with residual left-sided weakness, diabetes mellitus, OSA on nighttime oxygen 2 L and persistent atrial fibrillation on Coumadin therapy.  Per chart review patient lives with spouse.  1 level home 3 steps to entry.  Reportedly independent prior to admission.  Presented 12/09/2018 with recurrent atrial fibrillation, shortness of breath and altered mental status and attempts made at cardioversion unsuccessful placed on IV diltiazem drip.  INR on admission of 2.0.  Echocardiogram did not reveal any ischemic changes.  Patient remained on IV heparin as well as Coumadin.  Rapid response 12/12/2018 for mild disorientation and dysarthria as well as large hematoma left lateral chest..  No trauma noted.  Suspect spontaneous chest wall hematoma per cardiology services and heparin and warfarin were discontinued.  Hemoglobin dipped to 6.8 he was transfused 2 units of packed red blood cells latest hemoglobin 9.0.  Cranial CT scan of the head was completed 12/14/2018 due to altered mental status showed no acute changes as well as ABGs unremarkable.  Patient would remain off anticoagulation at this time consideration for watchman device and anticoagulation could be readdressed as outpatient.  Tolerating a regular diet.  Patient was admitted for a comprehensive rehab program.   Review of Systems  Constitutional: Positive for malaise/fatigue. Negative for chills and fever.  HENT: Negative for hearing loss.   Eyes: Negative for blurred vision and double vision.  Respiratory: Negative for cough and shortness of breath.   Cardiovascular: Positive for palpitations and leg swelling. Negative for chest pain.  Gastrointestinal: Positive for constipation. Negative for heartburn and nausea.       GERD  Genitourinary: Negative for  dysuria, flank pain and hematuria.  Musculoskeletal: Positive for back pain and myalgias.  Skin: Negative for rash.  Neurological:       Chronic left-sided weakness from history of CVA  Psychiatric/Behavioral: The patient has insomnia.   All other systems reviewed and are negative.   Past Medical History:  Diagnosis Date   Acute encephalopathy 12/12/2015   Acute respiratory failure with hypoxia and hypercapnia (HCC) 12/12/2015   Allergic rhinitis 02/13/2010    Qualifier: Diagnosis of  By: Wynona Luna    Angina at rest [facility]) 10/22/2015   Ataxia 08/10/2013   Atrial fibrillation ([place]) 11/2018   B12 deficiency 08/11/2013   BACK PAIN, LUMBAR 01/29/2009    Qualifier: Diagnosis of  By: Wynona Luna    BENIGN POSITIONAL VERTIGO 11/23/2009    Qualifier: Diagnosis of  By: Wynona Luna    Chronic diastolic heart failure ([place]) 04/24/2015   Chronic pain      left sided-[place]   Coronary 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 ([place]) 11/04/2015    s/p CABG 2004; CFX DES 2011   Essential hypertension 12/12/2006    Qualifier: Diagnosis of  By: [name] RMA, Lucy     GERD 12/12/2006    Qualifier: Diagnosis of  By: Reatha Armour, [place]     GOUT 12/12/2006    Qualifier: Diagnosis of  By: [name] RMA, Lucy     Hyperlipidemia LDL goal <70 12/12/2006    Qualifier: Diagnosis of  By: [name] RMA, Riva      Hypogonadism male 04/10/2011   Hypothyroidism, acquired 02/15/2013   Insomnia 05/21/2013   Long term current use of  anticoagulant therapy 06/24/2011   Obstructive sleep apnea-Failed CPAP 06/17/2007    Failed CPAP Using O 2 for sleep    Paroxysmal atrial fibrillation ([place]) 12/06/2016   Stroke (cerebrum) ([place]) 01/13/2017    also 1996; resultant hemiplegia of dominant side   Thoracic aorta atherosclerosis ([place]) 04/27/2017   Tubular adenoma of colon 05/2010   Type 2 diabetes mellitus without complication, without long-term current use of insulin ([place]) 10/05/2007     Qualifier: Diagnosis of  By: Wynona Luna    Vasovagal syncope          Past Surgical History:  Procedure Laterality Date   CARDIAC CATHETERIZATION N/A 10/25/2015    Procedure: Left Heart Cath and Cors/Grafts Angiography;  Surgeon: Troy Sine, MD;  Location: [place] [facility];  Service: Cardiovascular;  Laterality: N/A;   CARDIOVERSION   06/20/2011    Procedure: CARDIOVERSION;  Surgeon: Larey Dresser, MD;  Location: [place];  Service: Cardiovascular;  Laterality: N/A;   CARDIOVERSION N/A 04/26/2015    Procedure: CARDIOVERSION;  Surgeon: Dorothy Spark, MD;  Location: [facility] ENDOSCOPY;  Service: Cardiovascular;  Laterality: N/A;   CARDIOVERSION N/A 05/10/2015    Procedure: CARDIOVERSION;  Surgeon: Larey Dresser, MD;  Location: [place];  Service: Cardiovascular;  Laterality: N/A;   CARDIOVERSION N/A 12/10/2018    Procedure: CARDIOVERSION;  Surgeon: Sanda Klein, MD;  Location: [place];  Service: Cardiovascular;  Laterality: N/A;   CATARACT EXTRACTION   05/2010    left eye   CATARACT EXTRACTION   04/2010    right eye   CORONARY ARTERY BYPASS GRAFT        stent   ESOPHAGOGASTRODUODENOSCOPY   03-25-2005   LEFT HEART CATH AND CORS/GRAFTS ANGIOGRAPHY N/A 02/19/2018    Procedure: LEFT HEART CATH AND CORS/GRAFTS ANGIOGRAPHY;  Surgeon: Martinique, Peter M, MD;  Location: [place] [facility];  Service: Cardiovascular;  Laterality: N/A;   NASAL SEPTUM SURGERY       PERCUTANEOUS PLACEMENT INTRAVASCULAR STENT CERVICAL CAROTID ARTERY        03-2009; using a drug-eluting platform of the circumflex cornoray artery with a 3.0 x 18 [employer] Promus drug-eluting platform post dilated to 3.75 with a noncompliant balloon.   TEE WITHOUT CARDIOVERSION   06/20/2011    Procedure: TRANSESOPHAGEAL ECHOCARDIOGRAM (TEE);  Surgeon: Larey Dresser, MD;  Location: [facility] ENDOSCOPY;  Service: Cardiovascular;  Laterality: N/A;        Family History  Problem Relation Age of Onset   Lung  cancer Father          deceased   Stroke Mother          deceased-MINISTROKES   Social History:  reports that he has never smoked. He has never used smokeless tobacco. He reports that he does not drink alcohol or use drugs. Allergies: No Known Allergies       Medications Prior to Admission  Medication Sig Dispense Refill   allopurinol (ZYLOPRIM) 100 MG tablet TAKE 2 TABLETS BY MOUTH EVERY DAY (Patient taking differently: Take 200 mg by mouth daily. ) 180 tablet 1   atorvastatin (LIPITOR) 40 MG tablet Take 1 tablet (40 mg total) by mouth daily. 90 tablet 3   benazepril (LOTENSIN) 5 MG tablet Take 1 tablet (5 mg total) by mouth daily.       diltiazem (CARDIZEM CD) 180 MG 24 hr capsule Take 1 capsule (180 mg total) by mouth daily. 30 capsule 1   fluticasone (FLONASE) 50 MCG/ACT nasal spray  Place 2 sprays into both nostrils daily. (Patient taking differently: Place 2 sprays into both nostrils daily as needed for allergies. ) 9.9 g 1   furosemide (LASIX) 40 MG tablet TAKE 1 TABLET BY MOUTH EVERY OTHER DAY (Patient taking differently: Take 40 mg by mouth every other day. ) 45 tablet 1   isosorbide mononitrate (IMDUR) 30 MG 24 hr tablet TAKE 1/2 TABLET BY MOUTH EVERY DAY (Patient taking differently: Take 15 mg by mouth daily. ) 45 tablet 3   levothyroxine (SYNTHROID, LEVOTHROID) 112 MCG tablet TAKE 1 TABLET (112 MCG TOTAL) BY MOUTH DAILY BEFORE BREAKFAST. 90 tablet 1   metFORMIN (GLUCOPHAGE) 500 MG tablet TAKE 1 TABLET (500 MG TOTAL) DAILY WITH BREAKFAST BY MOUTH. 90 tablet 1   Multiple Vitamins-Minerals (MULTIVITAMIN GUMMIES MENS PO) Take 2 tablets by mouth daily with breakfast.        nitroGLYCERIN (NITROSTAT) 0.4 MG SL tablet Place 1 tablet (0.4 mg total) under the tongue every 5 (five) minutes as needed for chest pain. Up to 3 doses 10 tablet 2   OXYGEN Inhale 2 L into the lungs at bedtime.       pantoprazole (PROTONIX) 40 MG tablet TAKE 1 TABLET (40 MG TOTAL) EVERY MORNING BY MOUTH.  90 tablet 1   senna-docusate (SENOKOT-S) 8.6-50 MG tablet Take 2 tablets by mouth 2 (two) times daily.       traMADol (ULTRAM) 50 MG tablet TAKE 1 TABLET BY MOUTH TWICE A DAY (Patient taking differently: Take 50 mg by mouth 2 (two) times daily. ) 180 tablet 1   traMADol (ULTRAM-ER) 200 MG 24 hr tablet TAKE 1 TABLET BY MOUTH EVERY DAY (Patient taking differently: Take 200 mg by mouth every morning. ) 90 tablet 1   vitamin B-12 1000 MCG tablet Take 1 tablet (1,000 mcg total) by mouth daily. (Patient taking differently: Take 2,000 mcg by mouth daily. ) 30 tablet 0   warfarin (COUMADIN) 2.5 MG tablet Take 0.5-1 tablets (1.25-2.5 mg total) by mouth See admin instructions. Take 2.35m by mouth on MON WED FRI SAT, then take 1.257mon TUBellfloweroday and resume 9/16.       ACCU-CHEK GUIDE test strip USE UP TO FOUR TIMES DAILY AS DIRECTED 100 strip 0   blood glucose meter kit and supplies KIT Dispense based on patient and insurance preference. Use up to four times daily as directed. (FOR ICD-9 250.00, 250.01). 1 each 0   fluocinonide gel (LIDEX) 0.05 % Apply to scalp once daily as needed (Patient not taking: Reported on 12/09/2018) 60 g 0     Drug Regimen Review Drug regimen was reviewed and remains appropriate with no significant issues identified   Home: Home Living Family/patient expects to be discharged to:: Private residence Living Arrangements: Spouse/significant other(pt tells me he has 24 hour support) Available Help at Discharge: Family Type of Home: House Home Access: Stairs to enter EnTechnical brewerf Steps: 3-4 Entrance Stairs-Rails: Left Home Layout: One level Bathroom Shower/Tub: WaMultimedia programmerHandicapped height Additional Comments: pt reports a fully accessible home   Functional History: Prior Function Level of Independence: Independent Comments: typically active, drove and able to walk without AD until 2 weeks ago   Functional Status:    Mobility: Bed Mobility Overal bed mobility: Needs Assistance Bed Mobility: Supine to Sit Supine to sit: Mod assist Sit to supine: Max assist General bed mobility comments: modA to progress trunk upright Transfers Overall transfer level: Needs assistance Equipment used: Rolling walker (  2 wheeled) Transfers: Sit to/from Stand Sit to Stand: Mod assist, +2 physical assistance Stand pivot transfers: Mod assist, +2 physical assistance, +2 safety/equipment General transfer comment: moderate assist to power up;modA for stand-pivot; Ambulation/Gait Ambulation/Gait assistance: Mod assist, +2 physical assistance Gait Distance (Feet): 4 Feet Assistive device: Rolling walker (2 wheeled) Gait Pattern/deviations: Decreased stride length, Step-through pattern, Decreased step length - left, Decreased step length - right, Shuffle General Gait Details: good ability with transferring to recliner, took 5 steps forward and could not go any further, 5 steps back to recliner. O2 sats and fatigue limit ability to progress Gait velocity: decreased     ADL: ADL Overall ADL's : Needs assistance/impaired Eating/Feeding: Set up, Sitting Grooming: Set up, Sitting Upper Body Bathing: Set up, Sitting Lower Body Bathing: Moderate assistance, +2 for physical assistance, Sit to/from stand Upper Body Dressing : Set up, Sitting Lower Body Dressing: Moderate assistance, +2 for physical assistance, Sit to/from stand Lower Body Dressing Details (indicate cue type and reason): assist to don slippers  Toilet Transfer: Moderate assistance, +2 for physical assistance, Stand-pivot, RW Toilet Transfer Details (indicate cue type and reason): simulated from EOB to recliner Toileting- Clothing Manipulation and Hygiene: Total assistance Toileting - Clothing Manipulation Details (indicate cue type and reason): heavy reliance on BUE while standing pt would require assist for posterior pericare Functional mobility during ADLs:  Moderate assistance, Rolling walker, +2 for physical assistance, +2 for safety/equipment General ADL Comments: pt limited by decreased activity tolerance, weakness, and wob;unable to get clear o2 reading, pt reports minor dizziness while sitting EOB, increased wob with mobiltiy;pt required maxA for pursed lip breathing, pt on 2lnc   Cognition: Cognition Overall Cognitive Status: No family/caregiver present to determine baseline cognitive functioning Orientation Level: Oriented X4 Cognition Arousal/Alertness: Awake/alert Behavior During Therapy: WFL for tasks assessed/performed Overall Cognitive Status: No family/caregiver present to determine baseline cognitive functioning Area of Impairment: Following commands, Safety/judgement, Problem solving Following Commands: Follows one step commands with increased time Safety/Judgement: Decreased awareness of safety Problem Solving: Slow processing, Requires verbal cues, Requires tactile cues, Difficulty sequencing General Comments: Patient focused on wanting to get to IR   Physical Exam: Blood pressure 124/83, pulse 95, temperature 98.4 F (36.9 C), temperature source Oral, resp. rate 20, height _0  (1.905 m), weight 112.5 kg, SpO2 100 %. Physical Exam  Constitutional: No distress.  obese  HENT:  Head: Normocephalic and atraumatic.  Eyes: Pupils are equal, round, and reactive to light. EOM are normal.  Neck: Normal range of motion. Neck supple. No thyromegaly present.  Cardiovascular:  Murmur heard. IRR IRR  Respiratory: Effort normal and breath sounds normal. No respiratory distress. He has no wheezes. He has no rales.  On 2L oxygen  GI: Soft. He exhibits no distension. There is no abdominal tenderness.  Musculoskeletal:        General: Edema present.  Neurological:  Oriented to person, place. Provides biographical information.  Some delays in processing, distracted. Normal language. UE 4/5. LE 3-/5 HF, 3/5 KE and 4/5 ADF/PF. Normal  sensory. DTR's 1+      Skin: Skin is warm. He is not diaphoretic.  Bruising over left pec/axilla      Lab Results Last 48 Hours        Results for orders placed or performed during the hospital encounter of 12/08/18 (from the past 48 hour(s))  Glucose, capillary     Status: Abnormal    Collection Time: 12/14/18 12:19 PM  Result Value Ref Range  Glucose-Capillary 119 (H) 70 - 99 mg/dL  Basic metabolic panel     Status: Abnormal    Collection Time: 12/14/18 12:29 PM  Result Value Ref Range    Sodium 137 135 - 145 mmol/L    Potassium 4.3 3.5 - 5.1 mmol/L    Chloride 97 (L) 98 - 111 mmol/L    CO2 28 22 - 32 mmol/L    Glucose, Bld 128 (H) 70 - 99 mg/dL    BUN 32 (H) 8 - 23 mg/dL    Creatinine, Ser 1.49 (H) 0.61 - 1.24 mg/dL    Calcium 8.4 (L) 8.9 - 10.3 mg/dL    GFR calc non Af Amer 45 (L) >60 mL/min    GFR calc Af Amer 52 (L) >60 mL/min    Anion gap 12 5 - 15      Comment: Performed at [facility] [address], [place], [place] [address]  CBC     Status: Abnormal    Collection Time: 12/14/18 12:29 PM  Result Value Ref Range    WBC 16.2 (H) 4.0 - 10.5 K/uL    RBC 2.62 (L) 4.22 - 5.81 MIL/uL    Hemoglobin 7.3 (L) 13.0 - 17.0 g/dL    HCT 23.4 (L) 39.0 - 52.0 %    MCV 89.3 80.0 - 100.0 fL    MCH 27.9 26.0 - 34.0 pg    MCHC 31.2 30.0 - 36.0 g/dL    RDW 15.2 11.5 - 15.5 %    Platelets 121 (L) 150 - 400 K/uL    nRBC 0.0 0.0 - 0.2 %      Comment: Performed at [facility], [place] [address], [place], [place] [address]  Blood gas, arterial     Status: Abnormal    Collection Time: 12/14/18 12:56 PM  Result Value Ref Range    O2 Content 2.0 L/min    Delivery systems NASAL CANNULA      pH, Arterial 7.451 (H) 7.350 - 7.450    pCO2 arterial 44.8 32.0 - 48.0 mmHg    pO2, Arterial 81.8 (L) 83.0 - 108.0 mmHg    Bicarbonate 30.7 (H) 20.0 - 28.0 mmol/L    Acid-Base Excess 6.6 (H) 0.0 - 2.0 mmol/L    O2 Saturation 96.1 %    Patient temperature 98.6      Collection site  RIGHT RADIAL      Drawn by [id number]      Sample type ARTERIAL DRAW      Allens test (pass/fail) PASS PASS  Glucose, capillary     Status: Abnormal    Collection Time: 12/14/18  4:26 PM  Result Value Ref Range    Glucose-Capillary 140 (H) 70 - 99 mg/dL  Glucose, capillary     Status: Abnormal    Collection Time: 12/14/18  9:10 PM  Result Value Ref Range    Glucose-Capillary 144 (H) 70 - 99 mg/dL  Protime-INR     Status: Abnormal    Collection Time: 12/15/18  4:24 AM  Result Value Ref Range    Prothrombin Time 17.8 (H) 11.4 - 15.2 seconds    INR 1.5 (H) 0.8 - 1.2      Comment: (NOTE) INR goal varies based on device and disease states. Performed at [facility], [place] [address], [place], [place] [address]    Basic metabolic panel     Status: Abnormal    Collection Time: 12/15/18  4:24 AM  Result Value Ref Range  Sodium 135 135 - 145 mmol/L    Potassium 3.7 3.5 - 5.1 mmol/L    Chloride 96 (L) 98 - 111 mmol/L    CO2 29 22 - 32 mmol/L    Glucose, Bld 120 (H) 70 - 99 mg/dL    BUN 27 (H) 8 - 23 mg/dL    Creatinine, Ser 1.22 0.61 - 1.24 mg/dL    Calcium 8.1 (L) 8.9 - 10.3 mg/dL    GFR calc non Af Amer 58 (L) >60 mL/min    GFR calc Af Amer >60 >60 mL/min    Anion gap 10 5 - 15      Comment: Performed at [facility] [address] [place] [address]  CBC     Status: Abnormal    Collection Time: 12/15/18  4:24 AM  Result Value Ref Range    WBC 11.9 (H) 4.0 - 10.5 K/uL    RBC 2.42 (L) 4.22 - 5.81 MIL/uL    Hemoglobin 6.8 (LL) 13.0 - 17.0 g/dL      Comment: REPEATED TO VERIFY THIS CRITICAL RESULT HAS VERIFIED AND BEEN CALLED TO F. JONES,RN BY TERRAN TYSOR ON 09 30 2020 AT 0556, AND HAS BEEN READ BACK.       HCT 21.5 (L) 39.0 - 52.0 %    MCV 88.8 80.0 - 100.0 fL    MCH 28.1 26.0 - 34.0 pg    MCHC 31.6 30.0 - 36.0 g/dL    RDW 15.2 11.5 - 15.5 %    Platelets 167 150 - 400 K/uL    nRBC 0.0 0.0 - 0.2 %      Comment: Performed at [facility], [place]  [address], [place], [place] [address]  Glucose, capillary     Status: Abnormal    Collection Time: 12/15/18  6:16 AM  Result Value Ref Range    Glucose-Capillary 115 (H) 70 - 99 mg/dL  Prepare RBC     Status: None    Collection Time: 12/15/18  7:00 AM  Result Value Ref Range    Order Confirmation          ORDER PROCESSED BY BLOOD BANK Performed at [facility], [place] [address], [place], [place] [address]    Type and screen [facility]     Status: None    Collection Time: 12/15/18  7:02 AM  Result Value Ref Range    ABO/RH(D) AB POS      Antibody Screen NEG      Sample Expiration 12/18/2018,2359      Unit Number [id number]      Blood Component Type RED CELLS,LR      Unit division 00      Status of Unit ISSUED,FINAL      Transfusion Status OK TO TRANSFUSE      Crossmatch Result Compatible      Unit Number [id number]      Blood Component Type RED CELLS,LR      Unit division 00      Status of Unit ISSUED,FINAL      Transfusion Status OK TO TRANSFUSE      Crossmatch Result          Compatible Performed at [facility], [place] [address], [place], [place] [address]    Glucose, capillary     Status: Abnormal    Collection Time: 12/15/18 11:28 AM  Result Value Ref Range    Glucose-Capillary 160 (H) 70 - 99 mg/dL  Comment 1 Notify RN      Comment 2 Document in Chart    Glucose, capillary     Status: Abnormal    Collection Time: 12/15/18  4:22 PM  Result Value Ref Range    Glucose-Capillary 142 (H) 70 - 99 mg/dL    Comment 1 Notify RN      Comment 2 Document in Chart    CBC     Status: Abnormal    Collection Time: 12/15/18  9:34 PM  Result Value Ref Range    WBC 10.1 4.0 - 10.5 K/uL    RBC 2.97 (L) 4.22 - 5.81 MIL/uL    Hemoglobin 8.9 (L) 13.0 - 17.0 g/dL      Comment: REPEATED TO VERIFY POST TRANSFUSION SPECIMEN      HCT 26.5 (L) 39.0 - 52.0 %    MCV 89.2 80.0 - 100.0 fL    MCH 30.0 26.0 - 34.0 pg    MCHC 33.6 30.0 - 36.0 g/dL    RDW 14.6 11.5  - 15.5 %    Platelets 174 150 - 400 K/uL    nRBC 0.4 (H) 0.0 - 0.2 %      Comment: Performed at [facility], [place] [address], [place], [place] [address]  Glucose, capillary     Status: Abnormal    Collection Time: 12/15/18  9:42 PM  Result Value Ref Range    Glucose-Capillary 115 (H) 70 - 99 mg/dL  Protime-INR     Status: Abnormal    Collection Time: 12/16/18  4:23 AM  Result Value Ref Range    Prothrombin Time 17.6 (H) 11.4 - 15.2 seconds    INR 1.5 (H) 0.8 - 1.2      Comment: (NOTE) INR goal varies based on device and disease states. Performed at [facility], [place] [address], [place], [place] [address]    Basic metabolic panel     Status: Abnormal    Collection Time: 12/16/18  4:23 AM  Result Value Ref Range    Sodium 137 135 - 145 mmol/L    Potassium 3.4 (L) 3.5 - 5.1 mmol/L    Chloride 98 98 - 111 mmol/L    CO2 31 22 - 32 mmol/L    Glucose, Bld 105 (H) 70 - 99 mg/dL    BUN 22 8 - 23 mg/dL    Creatinine, Ser 1.05 0.61 - 1.24 mg/dL    Calcium 8.3 (L) 8.9 - 10.3 mg/dL    GFR calc non Af Amer >60 >60 mL/min    GFR calc Af Amer >60 >60 mL/min    Anion gap 8 5 - 15      Comment: Performed at [facility] [address] [place], [place] [address]  CBC     Status: Abnormal    Collection Time: 12/16/18  4:23 AM  Result Value Ref Range    WBC 8.9 4.0 - 10.5 K/uL    RBC 3.11 (L) 4.22 - 5.81 MIL/uL    Hemoglobin 9.0 (L) 13.0 - 17.0 g/dL    HCT 28.3 (L) 39.0 - 52.0 %    MCV 91.0 80.0 - 100.0 fL    MCH 28.9 26.0 - 34.0 pg    MCHC 31.8 30.0 - 36.0 g/dL    RDW 14.9 11.5 - 15.5 %    Platelets 182 150 - 400 K/uL    nRBC 0.3 (H) 0.0 - 0.2 %      Comment: Performed at [facility], [place] [address] [place], [place] [address]  Glucose, capillary     Status: Abnormal    Collection Time: 12/16/18  6:44 AM  Result Value Ref Range    Glucose-Capillary 120 (H) 70 - 99 mg/dL    *Note: Due to a large number of results and/or encounters for the requested time  period, some results have not been displayed. A complete set of results can be found in Results Review.      Imaging Results (Last 48 hours)  Ct Head Wo Contrast   Result Date: 12/14/2018 CLINICAL DATA:  Altered mental status EXAM: CT HEAD WITHOUT CONTRAST TECHNIQUE: Contiguous axial images were obtained from the base of the skull through the vertex without intravenous contrast. COMPARISON:  04/06/2017 FINDINGS: Brain: Mild atrophic and chronic white matter ischemic changes are identified. No findings to suggest acute hemorrhage, acute infarction or space-occupying mass lesion are noted. Vascular: No hyperdense vessel or unexpected calcification. Skull: Normal. Negative for fracture or focal lesion. Sinuses/Orbits: No acute finding. Other: None. IMPRESSION: Chronic atrophic and ischemic changes without acute abnormality. Electronically Signed   By: Inez Catalina M.D.   On: 12/14/2018 17:09    Dg Chest Port 1 View   Result Date: 12/14/2018 CLINICAL DATA:  Shortness of breath EXAM: PORTABLE CHEST 1 VIEW COMPARISON:  12/12/2018 FINDINGS: Previous median sternotomy and CABG as before. Cardiomegaly and atherosclerosis, unchanged. Persistent elevation of right hemidiaphragm. Scarring and/or atelectasis at the right base is unchanged. IMPRESSION: Stable elevation of right hemidiaphragm following median sternotomy with juxta diaphragmatic atelectasis and scarring. No acute finding. Electronically Signed   By: Zetta Bills M.D.   On: 12/14/2018 13:30            Medical Problem List and Plan: 1.  Acute encephalopathy secondary to multifactorial/atrial fibrillation/spontaneous acute chest wall hematoma secondary to anticoagulation             -admit patient to inpatient rehab today  2.  Antithrombotics: -DVT/anticoagulation: SCD             -antiplatelet therapy: N/A 3. Pain Management: Tramadol as needed 4. Mood: Vita motional support             -antipsychotic agents: N/A 5. Neuropsych: This  patient is generally capable of making decisions on his own behalf. 6. Skin/Wound Care: Routine skin checks 7. Fluids/Electrolytes/Nutrition: Routine in and outs with follow-up chemistries 8.  Atrial fibrillation.  Unsuccessful cardioversion.  Continue Cardizem CD 240 mg twice daily.  Follow-up cardiology services.  No plan to resume anticoagulation at this time due to acute chest wall hematoma             -heart rate controlled at present 9.  Acute blood loss anemia.  Continue iron supplement.  Follow-up CBC 10.  Hyperlipidemia.  Lipitor 11.  Diabetes mellitus.  Hemoglobin A1c 6.9.  SSI/Glucophage 500 mg daily             -monitor cbg's ac/hs             -adjust regimen as needed 12.  CAD with CABG 2004.  No chest pain or increasing shortness of breath. 13.  History of CVA with left-sided residual weakness.  Again no anticoagulation at this time 14.  Hypothyroidism.  Synthroid 15.  OSA.  Patient did use 2 L oxygen nightly prior to admission. 16.Hyperlipidemia.Lipitor     Lavon Paganini Angiulli, PA-C 12/16/2018  I have personally performed a face to face diagnostic evaluation of this patient and formulated  the key components of the plan.  Additionally, I have personally reviewed laboratory data, imaging studies, as well as relevant notes and concur with the physician assistant's documentation above.  The patient's status has not changed from the original H&P.  Any changes in documentation from the acute care chart have been noted above.  Meredith Staggers, MD, Mellody Drown

## 2018-12-16 NOTE — Progress Notes (Signed)
Transfer to  4W room 24.   Packed up patient belonging and found patient wallet had money in it. 3x$5 and 6x $1.    Patient is refusing to have wallet sent to security.   Placed wallet in valuables bag [phone].   Patient had phone and charger with him.  Bag of clothing, chargers also sent with patient.

## 2018-12-16 NOTE — Progress Notes (Signed)
Unable to complete due to lack of volunteers to serve as witnesses. Will work on completing Friday Dec 17 2018.  Chaplain Resident, Evelene Croon, M Div Pager # [phone] personal Pager # [phone] on-call

## 2018-12-16 NOTE — Progress Notes (Signed)
Physical Therapy Treatment Patient Details Name: Cayce Hull MRN: [id number] DOB: 09-Jan-1946 Today's Date: 12/16/2018    History of Present Illness Pt is 73 yo male with onset of a-fib was admitted for cardioversion, done 9/25, Pt was lethargic and had difficulty with chest wall hematoma forming.  Order for PT was discontinued then reordered.  PMHx:  CABG, PAF, encephalopathy, respiratory failure, OSA, DM, gout, HTN    PT Comments    Pt very willing to participate but requires increased time. Pt with decreased assist required for transfers and able to progress gait distance today although continues to remain limited and in need of additional rehab prior to return home. Pt reports sore sacrum with barrier cream applied and pillow in chair. Will continue to follow and progress with CIR appropriate.  HR  69 with activity with SpO2 88-98% on 2L    Follow Up Recommendations  CIR     Equipment Recommendations  Rolling walker with 5" wheels;3in1 (PT)    Recommendations for Other Services       Precautions / Restrictions Precautions Precautions: Fall Restrictions Weight Bearing Restrictions: No    Mobility  Bed Mobility Overal bed mobility: Needs Assistance Bed Mobility: Supine to Sit     Supine to sit: HOB elevated;Min assist;+2 for physical assistance     General bed mobility comments: HOB 45 degrees with use of rail and cues for sequence with increased time to pivot to right  Transfers Overall transfer level: Needs assistance   Transfers: Sit to/from Stand Sit to Stand: Min assist;+2 safety/equipment         General transfer comment: assist to rise with cues for scooting to edge of surface, hand placement x 4 trials from bed and recliner. Cues and assist to control descent to surface  Ambulation/Gait Ambulation/Gait assistance: Min assist;+2 safety/equipment Gait Distance (Feet): 12 Feet Assistive device: Rolling walker (2 wheeled) Gait Pattern/deviations:  Step-through pattern;Decreased stride length;Trunk flexed   Gait velocity interpretation: <1.8 ft/sec, indicate of risk for recurrent falls General Gait Details: cues for posture, looking up, proximity to RW and chair follow with pt able to walk 5', 12', 12' with close chair follow. SpO2 dropping to 88% on 2L with gait with seated rest return to 98%   Stairs             Wheelchair Mobility    Modified Rankin (Stroke Patients Only)       Balance Overall balance assessment: Needs assistance Sitting-balance support: Feet supported;Bilateral upper extremity supported Sitting balance-Leahy Scale: Fair Sitting balance - Comments: supervision EOB   Standing balance support: Bilateral upper extremity supported Standing balance-Leahy Scale: Poor Standing balance comment: heavy reliance on BUE                            Cognition Arousal/Alertness: Awake/alert Behavior During Therapy: WFL for tasks assessed/performed Overall Cognitive Status: No family/caregiver present to determine baseline cognitive functioning Area of Impairment: Following commands;Safety/judgement                       Following Commands: Follows one step commands consistently Safety/Judgement: Decreased awareness of deficits   Problem Solving: Slow processing General Comments: pt easily distracted by phone and needs cues to remain attentive to task      Exercises General Exercises - Lower Extremity Long Arc Quad: 20 reps;AROM;Both;Seated    General Comments        Pertinent Vitals/Pain Pain Score: 3  Pain Location: left knee and shoulder Pain Descriptors / Indicators: Aching Pain Intervention(s): Limited activity within patient's tolerance;Monitored during session;Repositioned    Home Living                      Prior Function            PT Goals (current goals can now be found in the care plan section) Progress towards PT goals: Progressing toward goals     Frequency    Min 3X/week      PT Plan Current plan remains appropriate    Co-evaluation              AM-PAC PT "6 Clicks" Mobility   Outcome Measure  Help needed turning from your back to your side while in a flat bed without using bedrails?: A Little Help needed moving from lying on your back to sitting on the side of a flat bed without using bedrails?: A Lot Help needed moving to and from a bed to a chair (including a wheelchair)?: A Little Help needed standing up from a chair using your arms (e.g., wheelchair or bedside chair)?: A Little Help needed to walk in hospital room?: A Little Help needed climbing 3-5 steps with a railing? : A Lot 6 Click Score: 16    End of Session Equipment Utilized During Treatment: Gait belt;Oxygen Activity Tolerance: Patient tolerated treatment well Patient left: in chair;with chair alarm set;with call bell/phone within reach Nurse Communication: Mobility status PT Visit Diagnosis: Muscle weakness (generalized) (M62.81);Difficulty in walking, not elsewhere classified (R26.2);Other abnormalities of gait and mobility (R26.89)     Time: [id number] PT Time Calculation (min) (ACUTE ONLY): 43 min  Charges:  $Gait Training: 8-22 mins $Therapeutic Exercise: 8-22 mins $Therapeutic Activity: 8-22 mins                     Linsay Vogt Pam Drown, PT Acute Rehabilitation Services Pager: [phone] Office: [place] 12/16/2018, 1:43 PM

## 2018-12-17 ENCOUNTER — Inpatient Hospital Stay (HOSPITAL_COMMUNITY): Payer: Medicare Other | Admitting: Physical Therapy

## 2018-12-17 ENCOUNTER — Inpatient Hospital Stay (HOSPITAL_COMMUNITY): Payer: Medicare Other | Admitting: Speech Pathology

## 2018-12-17 ENCOUNTER — Inpatient Hospital Stay (HOSPITAL_COMMUNITY): Payer: Medicare Other | Admitting: Occupational Therapy

## 2018-12-17 LAB — CBC WITH DIFFERENTIAL/PLATELET
Abs Immature Granulocytes: 0.05 10*3/uL (ref 0.00–0.07)
Basophils Absolute: 0.1 10*3/uL (ref 0.0–0.1)
Basophils Relative: 1 %
Eosinophils Absolute: 0.2 10*3/uL (ref 0.0–0.5)
Eosinophils Relative: 2 %
HCT: 27.9 % — ABNORMAL LOW (ref 39.0–52.0)
Hemoglobin: 9.2 g/dL — ABNORMAL LOW (ref 13.0–17.0)
Immature Granulocytes: 1 %
Lymphocytes Relative: 17 %
Lymphs Abs: 1.6 10*3/uL (ref 0.7–4.0)
MCH: 30.2 pg (ref 26.0–34.0)
MCHC: 33 g/dL (ref 30.0–36.0)
MCV: 91.5 fL (ref 80.0–100.0)
Monocytes Absolute: 1 10*3/uL (ref 0.1–1.0)
Monocytes Relative: 11 %
Neutro Abs: 6.1 10*3/uL (ref 1.7–7.7)
Neutrophils Relative %: 68 %
Platelets: 204 10*3/uL (ref 150–400)
RBC: 3.05 MIL/uL — ABNORMAL LOW (ref 4.22–5.81)
RDW: 15.3 % (ref 11.5–15.5)
WBC: 8.9 10*3/uL (ref 4.0–10.5)
nRBC: 0.2 % (ref 0.0–0.2)

## 2018-12-17 LAB — COMPREHENSIVE METABOLIC PANEL
ALT: 61 U/L — ABNORMAL HIGH (ref 0–44)
AST: 63 U/L — ABNORMAL HIGH (ref 15–41)
Albumin: 2.9 g/dL — ABNORMAL LOW (ref 3.5–5.0)
Alkaline Phosphatase: 55 U/L (ref 38–126)
Anion gap: 9 (ref 5–15)
BUN: 21 mg/dL (ref 8–23)
CO2: 30 mmol/L (ref 22–32)
Calcium: 8.5 mg/dL — ABNORMAL LOW (ref 8.9–10.3)
Chloride: 97 mmol/L — ABNORMAL LOW (ref 98–111)
Creatinine, Ser: 0.96 mg/dL (ref 0.61–1.24)
GFR calc Af Amer: >60 mL/min (ref >60)
GFR calc non Af Amer: >60 mL/min (ref >60)
Glucose, Bld: 117 mg/dL — ABNORMAL HIGH (ref 70–99)
Potassium: 3.8 mmol/L (ref 3.5–5.1)
Sodium: 136 mmol/L (ref 135–145)
Total Bilirubin: 3.8 mg/dL — ABNORMAL HIGH (ref 0.3–1.2)
Total Protein: 5.8 g/dL — ABNORMAL LOW (ref 6.5–8.1)

## 2018-12-17 LAB — GLUCOSE, CAPILLARY
Glucose-Capillary: 134 mg/dL — ABNORMAL HIGH (ref 70–99)
Glucose-Capillary: 111 mg/dL — ABNORMAL HIGH (ref 70–99)
Glucose-Capillary: 98 mg/dL (ref 70–99)
Glucose-Capillary: 184 mg/dL — ABNORMAL HIGH (ref 70–99)

## 2018-12-17 MED ORDER — GABAPENTIN 100 MG PO CAPS
100.0000 mg | ORAL_CAPSULE | Freq: Every day | ORAL | Status: DC
  Administered 2018-12-17 – 2018-12-23 (×7): 100 mg via ORAL
  Filled 2018-12-17 (×7): qty 1

## 2018-12-17 MED ORDER — TRAMADOL HCL 50 MG PO TABS
50.0000 mg | ORAL_TABLET | Freq: Four times a day (QID) | ORAL | Status: DC
  Administered 2018-12-17 – 2018-12-24 (×26): 50 mg via ORAL
  Filled 2018-12-17 (×28): qty 1

## 2018-12-17 NOTE — Evaluation (Signed)
Physical Therapy Assessment and Plan  Patient Details  Name: [name] MRN: [id number] Date of Birth: 1945/04/07  PT Diagnosis: Abnormal posture, Abnormality of gait and Muscle weakness Rehab Potential: Good ELOS: 7-10 days   Today's Date: 12/17/2018  AM Session  PT Individual Time: 3329-5188 PT Individual Time Calculation (min): 75 min    PM Session  Time: 14:50-15:15 Time Calculation: 25 min  Problem List:  Patient Active Problem List   Diagnosis Date Noted  . Chest wall hematoma 12/16/2018  . Delirium 12/16/2018  . Blood loss anemia 12/16/2018  . Dysuria 12/16/2018  . Acute encephalopathy 12/16/2018  . Acute on chronic diastolic heart failure ([place])   . Anticoagulated on warfarin   . Elevated troponin   . Congestive heart failure ([place])   . Abnormal INR   . AKI (acute kidney injury) ([place])   . History of gout   . History of hypothyroidism   . History of type 2 diabetes mellitus   . Atrial fibrillation with rapid ventricular response ([place]) 11/27/2018  . Ischemic chest pain ([place]) 02/17/2018  . Pure hypercholesterolemia 05/01/2017  . Thoracic aorta atherosclerosis ([place]) 04/27/2017  . Stroke (cerebrum) ([place]) 01/13/2017  . RUQ abdominal pain 01/13/2017  . Paroxysmal atrial fibrillation ([place]) 12/06/2016  . Dyspnea on exertion 10/21/2016  . Acute respiratory failure with hypoxia and hypercapnia ([place]) 12/12/2015  . Coronary 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 ([place]) 11/04/2015  . CAD S/P CFX DES 2011 04/25/2015  . Persistent atrial fibrillation 04/25/2015  . Vasovagal syncope   . Constipation 04/24/2015  . Chronic diastolic heart failure ([place]) 04/24/2015  . B12 deficiency 08/11/2013  . Sensory disturbance 08/11/2013  . Ataxia 08/10/2013  . Laceration of ear, external, right 07/05/2013  . Insomnia 05/21/2013  . Hypothyroidism, acquired 02/15/2013  . Weakness 11/17/2012  . Spastic hemiplegia affecting dominant side ([place]) 07/21/2011  . Long  term current use of anticoagulant therapy 06/24/2011  . Hypogonadism male 04/10/2011  . Fatigue 03/31/2011  . Allergic rhinitis 02/13/2010  . BENIGN POSITIONAL VERTIGO 11/23/2009  . 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 Hypothyroidism 04/02/2009  . Back pain 01/29/2009  . Type 2 diabetes mellitus without complication, without long-term current use of insulin ([place]) 10/05/2007  . Obstructive sleep apnea-Failed CPAP 06/17/2007  . Hyperlipidemia LDL goal <70 12/12/2006  . GOUT 12/12/2006  . Essential hypertension 12/12/2006  . History of stroke with residual deficit 12/12/2006  . GERD 12/12/2006    Past Medical History:  Past Medical History:  Diagnosis Date  . Acute encephalopathy 12/12/2015  . Acute respiratory failure with hypoxia and hypercapnia ([place]) 12/12/2015  . Allergic rhinitis 02/13/2010   Qualifier: Diagnosis of  By: Wynona Luna   . Angina at rest [facility] 10/22/2015  . Ataxia 08/10/2013  . Atrial fibrillation ([place]) 11/2018  . B12 deficiency 08/11/2013  . BACK PAIN, LUMBAR 01/29/2009   Qualifier: Diagnosis of  By: Wynona Luna   . BENIGN POSITIONAL VERTIGO 11/23/2009   Qualifier: Diagnosis of  By: Wynona Luna   . Chronic diastolic heart failure ([place]) 04/24/2015  . Chronic pain    left sided-[place]  . Coronary 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 ([place]) 11/04/2015   s/p CABG 2004; CFX DES 2011  . Essential hypertension 12/12/2006   Qualifier: Diagnosis of  By: [name] RMA, Lucy    . GERD 12/12/2006   Qualifier: Diagnosis of  By: [name] RMA, Lucy    . GOUT  12/12/2006   Qualifier: Diagnosis of  By: Reatha Armour, Lorre Nick    . Hyperlipidemia LDL goal <70 12/12/2006   Qualifier: Diagnosis of  By: [name] RMA, Lucy     . Hypogonadism male 04/10/2011  . Hypothyroidism, acquired 02/15/2013  . Insomnia 05/21/2013  . Long term current use of anticoagulant therapy 06/24/2011  . Obstructive sleep apnea-Failed CPAP 06/17/2007   Failed CPAP Using O 2 for sleep   . Paroxysmal  atrial fibrillation ([place]) 12/06/2016  . 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 Thoracic aorta atherosclerosis ([place]) 04/27/2017  . Tubular adenoma of colon 05/2010  . Type 2 diabetes mellitus without complication, without long-term current use of insulin ([place]) 10/05/2007   Qualifier: Diagnosis of  By: Wynona Luna   . Vasovagal syncope    Past Surgical History:  Past Surgical History:  Procedure Laterality Date  . CARDIAC CATHETERIZATION N/A 10/25/2015   Procedure: Left Heart Cath and Cors/Grafts Angiography;  Surgeon: Troy Sine, MD;  Location: [place] [facility];  Service: Cardiovascular;  Laterality: N/A;  . CARDIOVERSION  06/20/2011   Procedure: CARDIOVERSION;  Surgeon: Larey Dresser, MD;  Location: [facility] END[facility]COPY;  Service: Cardiovascular;  Laterality: N/A;  . CARDIOVERSION N/A 04/26/2015   Procedure: CARDIOVERSION;  Surgeon: Dorothy Spark, MD;  Location: [facility] ENDOSCOPY;  Service: Cardiovascular;  Laterality: N/A;  . CARDIOVERSION N/A 05/10/2015   Procedure: CARDIOVERSION;  Surgeon: Larey Dresser, MD;  Location: [place];  Service: Cardiovascular;  Laterality: N/A;  . CARDIOVERSION N/A 12/10/2018   Procedure: CARDIOVERSION;  Surgeon: Sanda Klein, MD;  Location: [place];  Service: Cardiovascular;  Laterality: N/A;  . CATARACT EXTRACTION  05/2010   left eye  . CATARACT EXTRACTION  04/2010   right eye  . CORONARY ARTERY BYPASS GRAFT     stent  . ESOPHAGOGASTRODUODENOSCOPY  03-25-2005  . LEFT HEART CATH AND CORS/GRAFTS ANGIOGRAPHY N/A 02/19/2018   Procedure: LEFT HEART CATH AND CORS/GRAFTS ANGIOGRAPHY;  Surgeon: Martinique, Peter M, MD;  Location: [place] [facility];  Service: Cardiovascular;  Laterality: N/A;  . NASAL SEPTUM SURGERY    . PERCUTANEOUS PLACEMENT INTRAVASCULAR STENT CERVICAL CAROTID ARTERY     03-2009; using a drug-eluting platform of the circumflex cornoray artery with a 3.0 x 18 [employer] Promus  drug-eluting platform post dilated to 3.75 with a noncompliant balloon.  . TEE WITHOUT CARDIOVERSION  06/20/2011   Procedure: TRANSESOPHAGEAL ECHOCARDIOGRAM (TEE);  Surgeon: Larey Dresser, MD;  Location: [facility] ENDOSCOPY;  Service: Cardiovascular;  Laterality: N/A;    Assessment & Plan Clinical Impression: SAVVAS ROPER a 73 year old right-handed male with history of congestive heart failure, hypertension, CAD with CABG 2004, CVA with residual left-sided weakness, diabetes mellitus, OSA on nighttime oxygen 2 L and persistent atrial fibrillation on Coumadin therapy. Per chart review patient lives with spouse. 1 level home 3 steps to entry. Reportedly independent prior to admission. Presented 12/09/2018 with recurrent atrial fibrillation, shortness of breath and altered mental status and attempts made at cardioversion unsuccessful placed on IV diltiazem drip. INR on admission of 2.0. Echocardiogram did not reveal any ischemic changes. Patient remained on IV heparin as well as Coumadin. Rapid response 12/12/2018 for mild disorientation and dysarthria as well as large hematoma left lateral chest.. No trauma noted. Suspect spontaneous chest wall hematoma per cardiology services and heparin and warfarin were discontinued. Hemoglobin dipped to 6.8 he was transfused 2 units of packed red blood cells latest hemoglobin 9.0.  Cranial CT scan of the head was completed 12/14/2018 due to altered mental status showed no acute changes as well as ABGs unremarkable. Patient would remain off anticoagulation at this time consideration for watchman device and anticoagulation could be readdressed as outpatient. Tolerating a regular diet.  Patient transferred to CIR on 12/16/2018 .   Patient currently requires min with mobility secondary to muscle weakness, muscle joint tightness and muscle paralysis, decreased cardiorespiratoy endurance, impaired timing and sequencing, decreased coordination and decreased motor planning  and decreased balance strategies.  Prior to hospitalization, patient was modified independent  with mobility and lived with Spouse, Family in a House home.  Home access is 3-4Stairs to enter.  Patient will benefit from skilled PT intervention to maximize safe functional mobility, minimize fall risk and decrease caregiver burden for planned discharge home with 24 hour supervision.  Anticipate patient will benefit from follow up [place] at discharge.  PT - End of Session Activity Tolerance: Tolerates < 10 min activity, no significant change in vital signs Endurance Deficit: Yes PT Assessment Rehab Potential (ACUTE/IP ONLY): Good PT Barriers to Discharge: Inaccessible home environment;Medical stability;Decreased caregiver support;Home environment access/layout PT Patient demonstrates impairments in the following area(s): Balance;Pain;Perception;Safety;Endurance;Motor;Sensory PT Transfers Functional Problem(s): Bed Mobility;Bed to Chair;Car;Furniture;Floor PT Locomotion Functional Problem(s): Ambulation;Wheelchair Mobility;Stairs PT Plan PT Intensity: Minimum of 1-2 x/day ,45 to 90 minutes PT Frequency: 5 out of 7 days PT Duration Estimated Length of Stay: 7-10 days PT Treatment/Interventions: Ambulation/gait training;Discharge planning;DME/adaptive equipment instruction;Functional mobility training;Pain management;Therapeutic Activities;UE/LE Strength taining/ROM;Wheelchair propulsion/positioning;UE/LE Coordination activities;Therapeutic Exercise;Stair training;Patient/family education;Neuromuscular re-education;Disease management/prevention;Community reintegration;Balance/vestibular training PT Transfers Anticipated Outcome(s): supervision with LRAD PT Locomotion Anticipated Outcome(s): supervision with LRAD PT Recommendation Follow Up Recommendations: Home health PT Patient destination: Home Equipment Recommended: To be determined  Skilled Therapeutic Intervention AM Session SPT performed  evaluation and initiated treatment intervention. Pt received in bed agreeable to PT evaluation and treatment. See below for details. In addition, pt completed car transfer with RW and minA-CGA. Pt self propelled wheelchair 60 feet with modA and verbal cueing for setup and instructions. Pt completed 5xSTS in 48.13 seconds. Pt completed evaluation and treatment with 2L O2 via nasal canula, portable O2 tank. Vital signs were monitored and stable throughout session. Pt educated on estimated length of stay and goals of therapy while in inpatient rehab. Pt transported back to room in wheelchair, left sitting up in chair, safety belt in place, and call bell within reach. All needs met at this time.  PM Session Pt in bed upon arrival for therapy. Pt reports bilateral itchiness in his legs especially the L side but he is agreeable to treatment. Pt transported to therapy gym. Pt completed sit>stand with RW and minA and then completed 23mT with RW and CGA. 1st trial: 0.342m, 2nd trial: 0.29. Less than 0.8 m/s is indicative of a fall risk and decreased community ambulation. Then pt completed sit>stand and completed TUG with RW and CGA in 38.90 seconds. Greater than 13 is indicative of a fall risk. Pt transported back to room in wheelchair. Pt completed stand pivot transfer with minA from wheelchair>bed. Pt's bed alarm set, call bell within reach, and all needs met at this time.   PT Evaluation Precautions/Restrictions Precautions Precautions: Fall Restrictions Weight Bearing Restrictions: No General   Vital SignsTherapy Vitals Pulse Rate: 83 BP: 123/80 Patient Position (if appropriate): Lying Oxygen Therapy SpO2: 100 % O2 Device: Nasal Cannula O2 Flow Rate (L/min): 2 L/min Patient Activity (if Appropriate): In bed Pain Pain in low back (8/10)  Home Living/Prior Functioning Home Living Available Help at Discharge: Family Type of Home: House Home Access: Stairs to enter [employer] of  Steps: 3-4 Entrance Stairs-Rails: Left Home Layout: One level Bathroom Shower/Tub: Multimedia programmer: Handicapped height Additional Comments: pt reports a fully accessible home  Lives With: Spouse;Family Prior Function Level of Independence: Independent with basic ADLs Driving: Yes Vocation: Retired Comments: pt reports using RW as AD at home Vision/Perception  Wears glasses, all the time, no changes in vision at this time   Cognition Overall Cognitive Status: Within Functional Limits for tasks assessed Arousal/Alertness: Awake/alert Orientation Level: Oriented X4 Attention: Selective Selective Attention: Appears intact Memory: Appears intact Awareness: Appears intact Problem Solving: Appears intact Safety/Judgment: Appears intact Sensation Sensation Light Touch: Impaired by gross assessment Additional Comments: pt reports entire L side of body doesn't feel the same as R side Coordination Gross Motor Movements are Fluid and Coordinated: No Coordination and Movement Description: L side weaker than R Motor  Motor Motor: Hemiplegia Motor - Skilled Clinical Observations: residual L sided deficits from hx of CVA  Mobility Bed Mobility Bed Mobility: Rolling Right;Supine to Sit Rolling Right: Minimal Assistance - Patient > 75% Supine to Sit: Minimal Assistance - Patient > 75% Transfers Transfers: Sit to Stand;Stand to [employer] Transfers Sit to Stand: Minimal Assistance - Patient > 75% Stand to Sit: Minimal Assistance - Patient > 75% Stand Pivot Transfers: Minimal Assistance - Patient > 75% Catering manager Details: Verbal cues for precautions/safety Stand Pivot Transfer Details (indicate cue type and reason): minA with RW Transfer (Assistive device): Rolling walker Locomotion  Gait Ambulation: Yes Gait Assistance: Minimal Assistance - Patient > 75% Gait Distance (Feet): 60 Feet Assistive device: Rolling walker Gait Assistance Details: Tactile  cues for posture;Verbal cues for precautions/safety Gait Assistance Details: minA with RW Gait Gait: Yes Gait Pattern: Decreased step length - left;Step-through pattern;Decreased dorsiflexion - left;Decreased hip/knee flexion - left Gait velocity: decreased Stairs / Additional Locomotion Stairs: Yes Stairs Assistance: Minimal Assistance - Patient > 75% Stair Management Technique: Two rails Number of Stairs: 4 Height of Stairs: 4 Ramp: Minimal Assistance - Patient >75% Wheelchair Mobility Wheelchair Mobility: Yes Wheelchair Assistance: Maximal Assistance - Patient 25 - 49% Wheelchair Propulsion: Right upper extremity;Right lower extremity Wheelchair Parts Management: Needs assistance Distance: 50  Trunk/Postural Assessment  Cervical Assessment Cervical Assessment: Within Functional Limits Thoracic Assessment Thoracic Assessment: Within Functional Limits Lumbar Assessment Lumbar Assessment: Within Functional Limits Postural Control Postural Control: Within Functional Limits Trunk Control: mild decreased trunk control  Balance Balance Balance Assessed: Yes Static Sitting Balance Static Sitting - Balance Support: Feet supported Static Sitting - Level of Assistance: 5: Stand by assistance Dynamic Sitting Balance Dynamic Sitting - Balance Support: Feet supported Dynamic Sitting - Level of Assistance: 4: Min assist Dynamic Sitting - Balance Activities: Forward lean/weight shifting Sitting balance - Comments: supervision EOB Static Standing Balance Static Standing - Balance Support: Bilateral upper extremity supported;During functional activity Static Standing - Level of Assistance: 4: Min assist Dynamic Standing Balance Dynamic Standing - Balance Support: Bilateral upper extremity supported;During functional activity Dynamic Standing - Level of Assistance: 4: Min assist Dynamic Standing - Balance Activities: Reaching for objects;Forward lean/weight shifting Extremity  Assessment      RLE Assessment RLE Assessment: Within Functional Limits General Strength Comments: grossly WFL, R stronger than L LE LLE Assessment LLE Assessment: Within Functional Limits General Strength Comments: grossly WFL, R stronger than L LE  Refer to Care Plan for Long Term Goals  Recommendations  for other services: None   Discharge Criteria: Patient will be discharged from PT if patient refuses treatment 3 consecutive times without medical reason, if treatment goals not met, if there is a change in medical status, if patient makes no progress towards goals or if patient is discharged from hospital.  The above assessment, treatment plan, treatment alternatives and goals were discussed and mutually agreed upon: by patient  Olena Leatherwood, SPT  12/17/2018, 11:59 AM

## 2018-12-17 NOTE — Progress Notes (Signed)
Pt son Legrand Como in room, verified again that only party to visit is [facility]. Informed him that only she can visit in person but will be more than happy to set up time for Facetime, Zoom or other means to see pt but just cant visit in person. He to say goodbye and will escort to exit.

## 2018-12-17 NOTE — Progress Notes (Signed)
Patient expressed that he is unable to sleep that at home he usually takes something to help him sleep.He could not recall what the medication was.He is really anxious and worried about taking something for sleep. No c/o shortness of breath, pain at this time.  I called the on call physician Pam. Was given verbal orders for Trazodone 50mg  prn at bedtime. Medication given will follow up.

## 2018-12-17 NOTE — Progress Notes (Signed)
Patient continues to be wake when checking in on him he says he is just unable to go to sleep despite medication. No c/o sob, pain. Patient did says room was cool and another blanket was provided and temperate was increased per patients request. Will continue to monitor patient.

## 2018-12-17 NOTE — Progress Notes (Signed)
[place] PHYSICAL MEDICINE & REHABILITATION PROGRESS NOTE   Subjective/Complaints:  No issues overnite , pt slept poorly with pain down left leg.  No progressive weakness  ROS- denies CP, SOB (wears O2 at home only at noc , now continuous) Objective:   No results found. Recent Labs    12/15/18 2134 12/16/18 0423  WBC 10.1 8.9  HGB 8.9* 9.0*  HCT 26.5* 28.3*  PLT 174 182   Recent Labs    12/15/18 0424 12/16/18 0423  NA 135 137  K 3.7 3.4*  CL 96* 98  CO2 29 31  GLUCOSE 120* 105*  BUN 27* 22  CREATININE 1.22 1.05  CALCIUM 8.1* 8.3*    Intake/Output Summary (Last 24 hours) at 12/17/2018 [id number] Last data filed at 12/17/2018 0357 Gross per 24 hour  Intake 222 ml  Output 325 ml  Net -103 ml     Physical Exam: Vital Signs Blood pressure 114/80, pulse 81, temperature 97.7 F (36.5 C), temperature source Oral, resp. rate (!) 24, weight 108.9 kg, SpO2 100 %.   General: No acute distress Mood and affect are appropriate Heart: Regular rate and rhythm no rubs murmurs or extra sounds Lungs: Clear to auscultation, breathing unlabored, no rales or wheezes Abdomen: Positive bowel sounds, soft nontender to palpation, nondistended Extremities: No clubbing, cyanosis, or edema Skin: No evidence of breakdown, no evidence of rash Neurologic: Cranial nerves II through XII intact, motor strength is 5/5 in RIght an d4/5 left  deltoid, bicep, tricep, grip, hip flexor, knee extensors, ankle dorsiflexor and plantar flexor Sensory exam reduced  sensation to light touch left  upper and lower extremities Cerebellar exam dysmetria on left  Musculoskeletal: Full range of motion in all 4 extremities. No joint swelling   Assessment/Plan: 1. Functional deficits secondary to Encephalopathy which require 3+ hours per day of interdisciplinary therapy in a comprehensive inpatient rehab setting.  Physiatrist is providing close team supervision and 24 hour management of active medical problems  listed below.  Physiatrist and rehab team continue to assess barriers to discharge/monitor patient progress toward functional and medical goals  Care Tool:  Bathing              Bathing assist       Upper Body Dressing/Undressing Upper body dressing        Upper body assist      Lower Body Dressing/Undressing Lower body dressing            Lower body assist       Toileting Toileting    Toileting assist Assist for toileting: Moderate Assistance - Patient 50 - 74%     Transfers Chair/bed transfer  Transfers assist           Locomotion Ambulation   Ambulation assist              Walk 10 feet activity   Assist           Walk 50 feet activity   Assist           Walk 150 feet activity   Assist           Walk 10 feet on uneven surface  activity   Assist           Wheelchair     Assist               Wheelchair 50 feet with 2 turns activity    Assist  Wheelchair 150 feet activity     Assist          Blood pressure 114/80, pulse 81, temperature 97.7 F (36.5 C), temperature source Oral, resp. rate (!) 24, weight 108.9 kg, SpO2 100 %.  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 Antithrombotics: -DVT/anticoagulation:SCD -antiplatelet therapy: N/A 3. 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 Mood:Vita motional  support -antipsychotic agents: N/A 5. Neuropsych: This patientisgenerallycapable of making decisions on hisown behalf. 6. Skin/Wound Care:Routine skin checks 7. Fluids/Electrolytes/Nutrition:Routine in and outs with follow-up chemistries 8. Atrial fibrillation. Unsuccessful cardioversion. Continue Cardizem CD 240 mg twice daily. Follow-up cardiology services. No plan to resume anticoagulation at this time due to acute chest wall hematoma -heart rate controlled at present 9. Acute blood loss anemia. Continue iron supplement. Follow-up CBC 10. Hyperlipidemia. Lipitor 11. Diabetes mellitus. Hemoglobin A1c 6.9. SSI/Glucophage 500 mg daily -monitor cbg's ac/hs -adjust regimen as needed 12. CAD with CABG 2004. No chest pain or increasing shortness of breath. 13. History of CVA with left-sided residual weakness. Again no anticoagulation at this time 14. Hypothyroidism. Synthroid 15. OSA. Patient did use 2 L oxygen nightly prior to admission. 16.Hyperlipidemia.Lipitor  17.  Social lives with wife, grown children and grandchildren in home as well   LOS: 1 days A FACE TO FACE EVALUATION WAS PERFORMED  Charlett Blake 12/17/2018, 6:38 AM

## 2018-12-17 NOTE — Evaluation (Signed)
Occupational Therapy Assessment and Plan  Patient Details  Name: [name] MRN: [id number] Date of Birth: June 09, 1945  OT Diagnosis: abnormal posture, apraxia, cognitive deficits, hemiplegia affecting non-dominant side, muscle weakness (generalized), pain in joint and swelling of limb Rehab Potential: Rehab Potential (ACUTE ONLY): Good ELOS: 10-12 days   Today's Date: 12/17/2018 OT Individual Time: 1100-1200 OT Individual Time Calculation (min): 60 min     Problem List:  Patient Active Problem List   Diagnosis Date Noted  . Chest wall hematoma 12/16/2018  . Delirium 12/16/2018  . Blood loss anemia 12/16/2018  . Dysuria 12/16/2018  . Acute encephalopathy 12/16/2018  . Acute on chronic diastolic heart failure ([place])   . Anticoagulated on warfarin   . Elevated troponin   . Congestive heart failure ([place])   . Abnormal INR   . AKI (acute kidney injury) ([place])   . History of gout   . History of hypothyroidism   . History of type 2 diabetes mellitus   . Atrial fibrillation with rapid ventricular response ([place]) 11/27/2018  . Ischemic chest pain ([place]) 02/17/2018  . Pure hypercholesterolemia 05/01/2017  . Thoracic aorta atherosclerosis ([place]) 04/27/2017  . Stroke (cerebrum) ([place]) 01/13/2017  . RUQ abdominal pain 01/13/2017  . Paroxysmal atrial fibrillation ([place]) 12/06/2016  . Dyspnea on exertion 10/21/2016  . Acute respiratory failure with hypoxia and hypercapnia ([place]) 12/12/2015  . Coronary 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 ([place]) 11/04/2015  . CAD S/P CFX DES 2011 04/25/2015  . Persistent atrial fibrillation 04/25/2015  . Vasovagal syncope   . Constipation 04/24/2015  . Chronic diastolic heart failure ([place]) 04/24/2015  . B12 deficiency 08/11/2013  . Sensory disturbance 08/11/2013  . Ataxia 08/10/2013  . Laceration of ear, external, right 07/05/2013  . Insomnia 05/21/2013  . Hypothyroidism, acquired 02/15/2013  . Weakness 11/17/2012  . Spastic  hemiplegia affecting dominant side ([place]) 07/21/2011  . Long term current use of anticoagulant therapy 06/24/2011  . Hypogonadism male 04/10/2011  . Fatigue 03/31/2011  . Allergic rhinitis 02/13/2010  . BENIGN POSITIONAL VERTIGO 11/23/2009  . 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 Hypothyroidism 04/02/2009  . Back pain 01/29/2009  . Type 2 diabetes mellitus without complication, without long-term current use of insulin ([place]) 10/05/2007  . Obstructive sleep apnea-Failed CPAP 06/17/2007  . Hyperlipidemia LDL goal <70 12/12/2006  . GOUT 12/12/2006  . Essential hypertension 12/12/2006  . History of stroke with residual deficit 12/12/2006  . GERD 12/12/2006    Past Medical History:  Past Medical History:  Diagnosis Date  . Acute encephalopathy 12/12/2015  . Acute respiratory failure with hypoxia and hypercapnia ([place]) 12/12/2015  . Allergic rhinitis 02/13/2010   Qualifier: Diagnosis of  By: Wynona Luna   . Angina at rest [facility]) 10/22/2015  . Ataxia 08/10/2013  . Atrial fibrillation ([place]) 11/2018  . B12 deficiency 08/11/2013  . BACK PAIN, LUMBAR 01/29/2009   Qualifier: Diagnosis of  By: Wynona Luna   . BENIGN POSITIONAL VERTIGO 11/23/2009   Qualifier: Diagnosis of  By: Wynona Luna   . Chronic diastolic heart failure ([place]) 04/24/2015  . Chronic pain    left sided-[place]  . Coronary 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 ([place]) 11/04/2015   s/p CABG 2004; CFX DES 2011  . Essential hypertension 12/12/2006   Qualifier: Diagnosis of  By: [name] RMA, Lucy    . GERD 12/12/2006   Qualifier: Diagnosis of  By: [name] RMA, Lucy    .  GOUT 12/12/2006   Qualifier: Diagnosis of  By: Reatha Armour, Lucy    . Hyperlipidemia LDL goal <70 12/12/2006   Qualifier: Diagnosis of  By: [name] RMA, Lucy     . Hypogonadism male 04/10/2011  . Hypothyroidism, acquired 02/15/2013  . Insomnia 05/21/2013  . Long term current use of anticoagulant therapy 06/24/2011  . Obstructive sleep apnea-Failed CPAP  06/17/2007   Failed CPAP Using O 2 for sleep   . Paroxysmal atrial fibrillation ([place]) 12/06/2016  . Stroke (cerebrum) ([place]) 01/13/2017   also 1996; resultant hemiplegia of dominant side  . Thoracic aorta atherosclerosis ([place]) 04/27/2017  . Tubular adenoma of colon 05/2010  . Type 2 diabetes mellitus without complication, without long-term current use of insulin ([place]) 10/05/2007   Qualifier: Diagnosis of  By: Wynona Luna   . Vasovagal syncope    Past Surgical History:  Past Surgical History:  Procedure Laterality Date  . CARDIAC CATHETERIZATION N/A 10/25/2015   Procedure: Left Heart Cath and Cors/Grafts Angiography;  Surgeon: Troy Sine, MD;  Location: [place] CV LAB;  Service: Cardiovascular;  Laterality: N/A;  . CARDIOVERSION  06/20/2011   Procedure: CARDIOVERSION;  Surgeon: Larey Dresser, MD;  Location[facility] ENDOSCOPY;  Service: Cardiovascular;  Laterality: N/A;  . CARDIOVERSION N/A 04/26/2015   Procedure: CARDIOVERSION;  Surgeon: Dorothy Spark, MD;  Location: [facility] ENDOSCOPY;  Service: Cardiovascular;  Laterality: N/A;  . CARDIOVERSION N/A 05/10/2015   Procedure: CARDIOVERSION;  Surgeon: Larey Dresser, MD;  Location: [place];  Service: Cardiovascular;  Laterality: N/A;  . CARDIOVERSION N/A 12/10/2018   Procedure: CARDIOVERSION;  Surgeon: Sanda Klein, MD;  Location: [place];  Service: Cardiovascular;  Laterality: N/A;  . CATARACT EXTRACTION  05/2010   left eye  . CATARACT EXTRACTION  04/2010   right eye  . CORONARY ARTERY BYPASS GRAFT     stent  . ESOPHAGOGASTRODUODENOSCOPY  03-25-2005  . LEFT HEART CATH AND CORS/GRAFTS ANGIOGRAPHY N/A 02/19/2018   Procedure: LEFT HEART CATH AND CORS/GRAFTS ANGIOGRAPHY;  Surgeon: Martinique, Peter M, MD;  Location: [place] CV LAB;  Service: Cardiovascular;  Laterality: N/A;  . NASAL SEPTUM SURGERY    . PERCUTANEOUS PLACEMENT INTRAVASCULAR STENT CERVICAL CAROTID ARTERY     03-2009; using a drug-eluting platform of the circumflex  cornoray artery with a 3.0 x 18 [employer] Promus drug-eluting platform post dilated to 3.75 with a noncompliant balloon.  . TEE WITHOUT CARDIOVERSION  06/20/2011   Procedure: TRANSESOPHAGEAL ECHOCARDIOGRAM (TEE);  Surgeon: Larey Dresser, MD;  Location: [facility] ENDOSCOPY;  Service: Cardiovascular;  Laterality: N/A;    Assessment & Plan Clinical Impression:  CARLON CHALOUX a 73 year old right-handed male with history of congestive heart failure, hypertension, CAD with CABG 2004, CVA with residual left-sided weakness, diabetes mellitus, OSA on nighttime oxygen 2 L and persistent atrial fibrillation on Coumadin therapy. Per chart review patient lives with spouse. 1 level home 3 steps to entry. Reportedly independent prior to admission. Presented 12/09/2018 with recurrent atrial fibrillation, shortness of breath and altered mental status and attempts made at cardioversion unsuccessful placed on IV diltiazem drip. INR on admission of 2.0. Echocardiogram did not reveal any ischemic changes. Patient remained on IV heparin as well as Coumadin. Rapid response 12/12/2018 for mild disorientation and dysarthria as well as large hematoma left lateral chest.. No trauma noted. Suspect spontaneous chest wall hematoma per cardiology services and heparin and warfarin were discontinued. Hemoglobin dipped to 6.8 he was transfused 2 units of packed red blood cells latest  hemoglobin 9.0. Cranial CT scan of the head was completed 12/14/2018 due to altered mental status showed no acute changes as well as ABGs unremarkable. Patient would remain off anticoagulation at this time consideration for watchman device and anticoagulation could be readdressed as outpatient. Tolerating a regular diet. Patient was admitted for a comprehensive rehab program.  Patient currently requires Mod with basic self-care skills secondary to muscle weakness, decreased cardiorespiratoy endurance, unbalanced muscle activation, ataxia,  decreased coordination and decreased motor planning, decreased motor planning, decreased problem solving and decreased memory and decreased standing balance.  Prior to hospitalization, patient could complete BADLs with modified independent .  Patient will benefit from skilled intervention to increase independence with basic self-care skills prior to discharge home with family.  Anticipate patient will require 24 hour supervision and minimal physical assistance and follow up home health.  OT - End of Session Endurance Deficit: Yes OT Assessment Rehab Potential (ACUTE ONLY): Good OT Barriers to Discharge: Medical stability OT Patient demonstrates impairments in the following area(s): Balance;Cognition;Edema;Endurance;Motor;Safety;Perception;Sensory;Skin Integrity OT Basic ADL's Functional Problem(s): Grooming;Bathing;Dressing;Toileting OT Advanced ADL's Functional Problem(s): Simple Meal Preparation OT Transfers Functional Problem(s): Toilet;Tub/Shower OT Additional Impairment(s): Fuctional Use of Upper Extremity OT Plan OT Intensity: Minimum of 1-2 x/day, 45 to 90 minutes OT Frequency: 5 out of 7 days OT Duration/Estimated Length of Stay: 10-12 days OT Treatment/Interventions: Balance/vestibular training;DME/adaptive equipment instruction;Patient/family education;Therapeutic Activities;Cognitive remediation/compensation;Psychosocial support;Therapeutic Exercise;Wheelchair propulsion/positioning;Community reintegration;Functional mobility training;Self Care/advanced ADL retraining;UE/LE Strength taining/ROM;UE/LE Coordination activities;Skin care/wound managment;Neuromuscular re-education;Discharge planning;Disease mangement/prevention;Pain management;Visual/perceptual remediation/compensation OT Self Feeding Anticipated Outcome(s): No goal OT Basic Self-Care Anticipated Outcome(s): Supervision-Min A OT Toileting Anticipated Outcome(s): Supervision OT Bathroom Transfers Anticipated Outcome(s):  Supervision OT Recommendation Recommendations for Other Services: Speech consult Patient destination: Home Follow Up Recommendations: Home health OT Equipment Recommended: To be determined Skilled Therapeutic Intervention Skilled OT session completed with focus on initial evaluation, education on OT role/POC, and establishment of patient-centered goals.   Pt greeted in bed on 2L 02. sats 100% at rest. Supine<sit completed with supervision using bedrail, and for remainder of session pt completed bathing/dressing sit<stand from EOB using RW. Steady assist for sit<stands and Min A for dynamic standing balance during LB self care. He needed A to complete perihygiene in the back and wash his feet due to fatigue and pt refusal to attempt himself. Assist also needed for problem solving his suspenders, though pt was able to clip the fasteners to his pants. Max A for donning pants. 02 sats monitored throughout session, weaning him off of supplemental 02 for last half of tx. 02 sats >91% post stands, though when he ambulated to the bathroom using RW his 02 decreased to 89%. Note that sats increased to 97% within 30 seconds. Then he ambulated back to bed with steady assist and vcs for DME mgt. Pt returned to supine with supervision. Placed him back on 2L supplemental 02. He was left in care of NT at session exit.    Pt able to use L UE at nondominant/gross assist level. Noted ataxia in the affected limb as well.   OT Evaluation Precautions/Restrictions  Precautions Precautions: Fall Restrictions Weight Bearing Restrictions: No General Chart Reviewed: Yes Family/Caregiver Present: No Vital Signs Therapy Vitals Pulse Rate: 83 BP: 123/80 Patient Position (if appropriate): Lying Oxygen Therapy SpO2: 100 % O2 Device: Nasal Cannula O2 Flow Rate (L/min): 2 L/min Patient Activity (if Appropriate): In bed Pain: RN made aware of pts request for pain medicine at end of session, per request (numbness/pain  in Lt foot,  radiating up to knee)   Home Living/Prior Functioning Home Living Available Help at Discharge: Family Type of Home: House Home Access: Stairs to enter Technical brewer of Steps: 3-4 Entrance Stairs-Rails: Left Home Layout: One level Bathroom Shower/Tub: Gaffer, Chiropodist: Handicapped height Bathroom Accessibility: Yes Additional Comments: pt reports a fully accessible home  Lives With: Spouse, Family(spouse + granddaughters) IADL History Homemaking Responsibilities: No(per pt, spouse took care of IADL responsibilities) Occupation: Retired Type of Occupation: Worked in [employer], [place] He was also an Mining engineer for 31 years Leisure and Hobbies: Spending time with his [place] IADL Comments: Pt reported driving PTA Prior Function Level of Independence: Requires assistive device for independence, Independent with basic ADLs, Independent with homemaking with ambulation(He reported using the RW for mobility during self care and light cooking activity) Driving: Yes Vocation: Retired Comments: pt reports using RW as AD at home ADL ADL Eating: Not assessed Grooming: Not assessed Upper Body Bathing: Supervision/safety Where Assessed-Upper Body Bathing: Edge of bed Lower Body Bathing: Moderate assistance Where Assessed-Lower Body Bathing: Edge of bed Upper Body Dressing: Setup Where Assessed-Upper Body Dressing: Edge of bed Lower Body Dressing: Maximal assistance Where Assessed-Lower Body Dressing: Edge of bed Toileting: Not assessed Toilet Transfer: Contact guard Toilet Transfer Method: Ambulating(RW) Social research officer, government: Not assessed Vision Baseline Vision/History: Wears glasses Wears Glasses: At all times Patient Visual Report: No change from baseline Vision Assessment?: No apparent visual deficits Perception  Perception: Within Functional Limits Praxis Praxis: Impaired Praxis Impairment  Details: Motor planning Praxis-Other Comments: Min motor planning deficits noted during dressing tasks Cognition Overall Cognitive Status: Within Functional Limits for tasks assessed Arousal/Alertness: Awake/alert Orientation Level: Person;Place Year: 2020 Month: October Day of Week: Correct Memory: Impaired Memory Impairment: Decreased recall of new information Immediate Memory Recall: Sock;Blue;Bed Memory Recall Sock: Without Cue Memory Recall Blue: Without Cue Memory Recall Bed: Without Cue Attention: Selective Selective Attention: Appears intact Awareness: Appears intact Problem Solving: Impaired Safety/Judgment: Appears intact Sensation Sensation Light Touch: Impaired Detail Light Touch Impaired Details: Impaired RLE;Impaired LLE(Hx peripheral neuropathy) Additional Comments: pt reports entire L side of body doesn't feel the same as R side Coordination Gross Motor Movements are Fluid and Coordinated: No Fine Motor Movements are Fluid and Coordinated: No Coordination and Movement Description: L side weaker than R Finger Nose Finger Test: Ataxia B UEs, Lt>Rt Motor  Motor Motor: Hemiplegia;Ataxia;Abnormal postural alignment and control Motor - Skilled Clinical Observations: residual L sided deficits from hx of CVA Mobility  Bed Mobility Bed Mobility: Rolling Right;Supine to Sit Rolling Right: Minimal Assistance - Patient > 75% Supine to Sit: Minimal Assistance - Patient > 75% Transfers Sit to Stand: Minimal Assistance - Patient > 75% Stand to Sit: Minimal Assistance - Patient > 75%  Trunk/Postural Assessment  Cervical Assessment Cervical Assessment: Exceptions to WFL(forward head) Thoracic Assessment Thoracic Assessment: Exceptions to WFL(Lt scapular elevation, ecchymosis Lt>Rt flank) Lumbar Assessment Lumbar Assessment: Within Functional Limits Postural Control Postural Control: Within Functional Limits Trunk Control: mild decreased trunk control   Balance Balance Balance Assessed: Yes Static Sitting Balance Static Sitting - Balance Support: Feet supported Static Sitting - Level of Assistance: 5: Stand by assistance Dynamic Sitting Balance Dynamic Sitting - Balance Support: Feet supported Dynamic Sitting - Level of Assistance: 4: Min assist(assisting with washing lower legs) Dynamic Sitting - Balance Activities: Forward lean/weight shifting Sitting balance - Comments: supervision EOB Static Standing Balance Static Standing - Balance Support: Bilateral upper extremity supported;During functional activity Static Standing -  Level of Assistance: 4: Min assist Dynamic Standing Balance Dynamic Standing - Balance Support: During functional activity Dynamic Standing - Level of Assistance: 4: Min assist Dynamic Standing - Balance Activities: Lateral lean/weight shifting;Forward lean/weight shifting Dynamic Standing - Comments: LB dressing Extremity/Trunk Assessment RUE Assessment Active Range of Motion (AROM) Comments: 3-/5, shoulder flexion + abduction ~45 degrees General Strength Comments: 3-/5 grossly LUE Assessment Active Range of Motion (AROM) Comments: 3-/5 shoulder flexion + abduction slightly more limited than on the Rt side General Strength Comments: 3-/5 grossly, ataxic, able to use at gross assist/nondominant level during self care     Refer to Care Plan for Long Term Goals  Recommendations for other services: Neuropsych and Other: SLP   Discharge Criteria: Patient will be discharged from OT if patient refuses treatment 3 consecutive times without medical reason, if treatment goals not met, if there is a change in medical status, if patient makes no progress towards goals or if patient is discharged from hospital.  The above assessment, treatment plan, treatment alternatives and goals were discussed and mutually agreed upon: by patient  Skeet Simmer 12/17/2018, 12:36 PM

## 2018-12-17 NOTE — Progress Notes (Signed)
Patient is c/o leg pain and restless legs pain level is a 6 at this time prn tramadol was given.Will report this to nurse in report to follow up with the physician this morning.

## 2018-12-17 NOTE — Evaluation (Signed)
Speech Language Pathology Assessment and Plan  Patient Details  Name: [name] MRN: [id number] Date of Birth: 1946/01/23  Evaluation Only  Today's Date: 12/17/2018 SLP Individual Time: 3299-2426 SLP Individual Time Calculation (min): 30 min   Problem List:  Patient Active Problem List   Diagnosis Date Noted  . Chest wall hematoma 12/16/2018  . Delirium 12/16/2018  . Blood loss anemia 12/16/2018  . Dysuria 12/16/2018  . Acute encephalopathy 12/16/2018  . Acute on chronic diastolic heart failure ([place])   . Anticoagulated on warfarin   . Elevated troponin   . Congestive heart failure ([place])   . Abnormal INR   . AKI (acute kidney injury) ([place])   . History of gout   . History of hypothyroidism   . History of type 2 diabetes mellitus   . Atrial fibrillation with rapid ventricular response ([place]) 11/27/2018  . Ischemic chest pain ([place]) 02/17/2018  . Pure hypercholesterolemia 05/01/2017  . Thoracic aorta atherosclerosis ([place]) 04/27/2017  . Stroke (cerebrum) ([place]) 01/13/2017  . RUQ abdominal pain 01/13/2017  . Paroxysmal atrial fibrillation ([place]) 12/06/2016  . Dyspnea on exertion 10/21/2016  . Acute respiratory failure with hypoxia and hypercapnia ([place]) 12/12/2015  . Coronary 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 ([place]) 11/04/2015  . CAD S/P CFX DES 2011 04/25/2015  . Persistent atrial fibrillation 04/25/2015  . Vasovagal syncope   . Constipation 04/24/2015  . Chronic diastolic heart failure ([place]) 04/24/2015  . B12 deficiency 08/11/2013  . Sensory disturbance 08/11/2013  . Ataxia 08/10/2013  . Laceration of ear, external, right 07/05/2013  . Insomnia 05/21/2013  . Hypothyroidism, acquired 02/15/2013  . Weakness 11/17/2012  . Spastic hemiplegia affecting dominant side ([place]) 07/21/2011  . Long term current use of anticoagulant therapy 06/24/2011  . Hypogonadism male 04/10/2011  . Fatigue 03/31/2011  . Allergic rhinitis 02/13/2010  . BENIGN  POSITIONAL VERTIGO 11/23/2009  . 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 Hypothyroidism 04/02/2009  . Back pain 01/29/2009  . Type 2 diabetes mellitus without complication, without long-term current use of insulin ([place]) 10/05/2007  . Obstructive sleep apnea-Failed CPAP 06/17/2007  . Hyperlipidemia LDL goal <70 12/12/2006  . GOUT 12/12/2006  . Essential hypertension 12/12/2006  . History of stroke with residual deficit 12/12/2006  . GERD 12/12/2006   Past Medical History:  Past Medical History:  Diagnosis Date  . Acute encephalopathy 12/12/2015  . Acute respiratory failure with hypoxia and hypercapnia ([place]) 12/12/2015  . Allergic rhinitis 02/13/2010   Qualifier: Diagnosis of  By: Wynona Luna   . Angina at rest [facility]) 10/22/2015  . Ataxia 08/10/2013  . Atrial fibrillation ([place]) 11/2018  . B12 deficiency 08/11/2013  . BACK PAIN, LUMBAR 01/29/2009   Qualifier: Diagnosis of  By: Wynona Luna   . BENIGN POSITIONAL VERTIGO 11/23/2009   Qualifier: Diagnosis of  By: Wynona Luna   . Chronic diastolic heart failure ([place]) 04/24/2015  . Chronic pain    left sided-[place]  . Coronary 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 ([place]) 11/04/2015   s/p CABG 2004; CFX DES 2011  . Essential hypertension 12/12/2006   Qualifier: Diagnosis of  By: [name] RMA, Lucy    . GERD 12/12/2006   Qualifier: Diagnosis of  By: [name] RMA, Lucy    . GOUT 12/12/2006   Qualifier: Diagnosis of  By: Reatha Armour, Lucy    . Hyperlipidemia LDL goal <70 12/12/2006   Qualifier: Diagnosis of  By: [name] RMA, Lucy     .  Hypogonadism male 04/10/2011  . Hypothyroidism, acquired 02/15/2013  . Insomnia 05/21/2013  . Long term current use of anticoagulant therapy 06/24/2011  . Obstructive sleep apnea-Failed CPAP 06/17/2007   Failed CPAP Using O 2 for sleep   . Paroxysmal atrial fibrillation ([place]) 12/06/2016  . 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 Thoracic aorta  atherosclerosis ([place]) 04/27/2017  . Tubular adenoma of colon 05/2010  . Type 2 diabetes mellitus without complication, without long-term current use of insulin ([place]) 10/05/2007   Qualifier: Diagnosis of  By: Wynona Luna   . Vasovagal syncope    Past Surgical History:  Past Surgical History:  Procedure Laterality Date  . CARDIAC CATHETERIZATION N/A 10/25/2015   Procedure: Left Heart Cath and Cors/Grafts Angiography;  Surgeon: Troy Sine, MD;  Location: [facility];  Service: Cardiovascular;  Laterality: N/A;  . CARDIOVERSION  06/20/2011   Procedure: CARDIOVERSION;  Surgeon: Larey Dresser, MD;  Location: [facility] ENDOSCOPY;  Service: Cardiovascular;  Laterality: N/A;  . CARDIOVERSION N/A 04/26/2015   Procedure: CARDIOVERSION;  Surgeon: Dorothy Spark, MD;  Location: [facility] ENDOSCOPY;  Service: Cardiovascular;  Laterality: N/A;  . CARDIOVERSION N/A 05/10/2015   Procedure: CARDIOVERSION;  Surgeon: Larey Dresser, MD;  Location: [facility];  Service: Cardiovascular;  Laterality: N/A;  . CARDIOVERSION N/A 12/10/2018   Procedure: CARDIOVERSION;  Surgeon: Sanda Klein, MD;  Location: [facility];  Service: Cardiovascular;  Laterality: N/A;  . CATARACT EXTRACTION  05/2010   left eye  . CATARACT EXTRACTION  04/2010   right eye  . CORONARY ARTERY BYPASS GRAFT     stent  . ESOPHAGOGASTRODUODENOSCOPY  03-25-2005  . LEFT HEART CATH AND CORS/GRAFTS ANGIOGRAPHY N/A 02/19/2018   Procedure: LEFT HEART CATH AND CORS/GRAFTS ANGIOGRAPHY;  Surgeon: Martinique, Peter M, MD;  Location: [facility];  Service: Cardiovascular;  Laterality: N/A;  . NASAL SEPTUM SURGERY    . PERCUTANEOUS PLACEMENT INTRAVASCULAR STENT CERVICAL CAROTID ARTERY     03-2009; using a drug-eluting platform of the circumflex cornoray artery with a 3.0 x 18 [employer] Promus drug-eluting platform post dilated to 3.75 with a noncompliant balloon.  . TEE WITHOUT CARDIOVERSION  06/20/2011   Procedure: TRANSESOPHAGEAL  ECHOCARDIOGRAM (TEE);  Surgeon: Larey Dresser, MD;  Location: [facility];  Service: Cardiovascular;  Laterality: N/A;    Assessment / Plan / Recommendation Clinical Impression [name] is a 73 year old right-handed male with history of congestive heart failure, hypertension, CAD with CABG 2004, CVA with residual left-sided weakness, diabetes mellitus, OSA on nighttime oxygen 2 L and persistent atrial fibrillation on Coumadin therapy.  Per chart review patient lives with spouse.  1 level home 3 steps to entry.  Reportedly independent prior to admission.  Presented 12/09/2018 with recurrent atrial fibrillation, shortness of breath and altered mental status and attempts made at cardioversion unsuccessful placed on IV diltiazem drip.  INR on admission of 2.0.  Echocardiogram did not reveal any ischemic changes.  Patient remained on IV heparin as well as Coumadin.  Rapid response 12/12/2018 for mild disorientation and dysarthria as well as large hematoma left lateral chest..  No trauma noted.  Suspect spontaneous chest wall hematoma per cardiology services and heparin and warfarin were discontinued.  Hemoglobin dipped to 6.8 he was transfused 2 units of packed red blood cells latest hemoglobin 9.0.  Cranial CT scan of the head was completed 12/14/2018 due to altered mental status showed no acute changes as well  as ABGs unremarkable.  Patient would remain off anticoagulation at this time consideration for watchman device and anticoagulation could be readdressed as outpatient.  Tolerating a regular diet.  Patient was admitted for a comprehensive rehab program on 12/16/18.  Pt with baseline higher level wording deficits d/t CVA in 2018 with remaining right facial weakness. During this evaluation, pt with word finding difficulty x 1 that resulted in minimal pause but pt able to continue conversation with good thought organization and no negative impact on communication ability. Pt also presents with cognitive  deficits that are considered appropriate as he demonstrates good recall of information, insight into physical deficits, good safety awareness, is oriented x 4 and able to demonstrate appropriate problem solving abilities. Pt is able to communicate unknown topics with appropriate speech intelligibility and word finding ability. At this time, skilled ST doesn't appear indicated. SLP consulted with interdisciplinary team who also agree. All education completed and all questions answered to pt satisfaction.    Skilled Therapeutic Interventions          Skilled treatment session focused on completion of above mentioned evaluation.   SLP Assessment  Patient does not need any further Speech [place] Pathology Services    Recommendations  Patient destination: Home Follow up Recommendations: None Equipment Recommended: None recommended by SLP           Pain Pain Assessment Pain Scale: 0-10 Pain Score: 8  Pain Type: Chronic pain Pain Location: Back Pain Orientation: Lower Pain Radiating Towards: left leg Pain Descriptors / Indicators: Throbbing Pain Frequency: Constant Pain Onset: On-going Pain Intervention(s): Medication (See eMAR)  Prior Functioning Cognitive/Linguistic Baseline: Within functional limits Type of Home: House  Lives With: Spouse;Family Available Help at Discharge: Family Vocation: Retired  Industrial/product designer Term Goals: No short term Landscape architect to [place] for [place]  Recommendations for other services: None   Discharge Criteria: Patient will be discharged from SLP if patient refuses treatment 3 consecutive times without medical reason, if treatment goals not met, if there is a change in medical status, if patient makes no progress towards goals or if patient is discharged from hospital.  The above assessment, treatment plan, treatment alternatives and goals were discussed and mutually agreed upon: by patient  Anesha Hackert 12/17/2018, 9:55 AM

## 2018-12-18 LAB — GLUCOSE, CAPILLARY
Glucose-Capillary: 107 mg/dL — ABNORMAL HIGH (ref 70–99)
Glucose-Capillary: 167 mg/dL — ABNORMAL HIGH (ref 70–99)
Glucose-Capillary: 139 mg/dL — ABNORMAL HIGH (ref 70–99)
Glucose-Capillary: 110 mg/dL — ABNORMAL HIGH (ref 70–99)

## 2018-12-18 MED ORDER — MAGNESIUM CITRATE PO SOLN: 1.0000 | Freq: Once | ORAL | Status: DC

## 2018-12-18 NOTE — Progress Notes (Signed)
[facility] PHYSICAL MEDICINE & REHABILITATION PROGRESS NOTE   Subjective/Complaints:  Pt reports has a BM "in there" that's "the size of a softball" and needs help to go- is concerned that can't push that hard or will "stroke out".  Asking for stool meds.   ROS- denies CP, SOB (wears O2 at home only at noc , now continuous) Objective:   No results found. Recent Labs    12/16/18 0423 12/17/18 0632  WBC 8.9 8.9  HGB 9.0* 9.2*  HCT 28.3* 27.9*  PLT 182 204   Recent Labs    12/16/18 0423 12/17/18 0632  NA 137 136  K 3.4* 3.8  CL 98 97*  CO2 31 30  GLUCOSE 105* 117*  BUN 22 21  CREATININE 1.05 0.96  CALCIUM 8.3* 8.5*    Intake/Output Summary (Last 24 hours) at 12/18/2018 1530 Last data filed at 12/18/2018 0851 Gross per 24 hour  Intake 720 ml  Output 575 ml  Net 145 ml     Physical Exam: Vital Signs Blood pressure 129/73, pulse 94, temperature 97.7 F (36.5 C), temperature source Oral, resp. rate 20, weight 109.6 kg, SpO2 100 %.   General: No acute distress; sitting up in chair at bedside wearing O2 by [place], NAD; B/L arm IVs Mood and affect are appropriate Heart: Regular rate and rhythm no rubs murmurs or extra sounds Lungs: Clear to auscultation, breathing unlabored, no rales or wheezes Abdomen: Positive bowel sounds, soft nontender to palpation, nondistended Extremities: No clubbing, cyanosis, or edema Skin: No evidence of breakdown, no evidence of rash Neurologic: Cranial nerves II through XII intact, motor strength is 5/5 in RIght and 4/5 left  deltoid, bicep, tricep, grip, hip flexor, knee extensors, ankle dorsiflexor and plantar flexor Sensory exam reduced  sensation to light touch left  upper and lower extremities Cerebellar exam dysmetria on left  Musculoskeletal: Full range of motion in all 4 extremities. No joint swelling   Assessment/Plan: 1. Functional deficits secondary to Encephalopathy which require 3+ hours per day of interdisciplinary therapy  in a comprehensive inpatient rehab setting.  Physiatrist is providing close team supervision and 24 hour management of active medical problems listed below.  Physiatrist and rehab team continue to assess barriers to discharge/monitor patient progress toward functional and medical goals  Care Tool:  Bathing    Body parts bathed by patient: Right arm, Left arm, Chest, Abdomen, Front perineal area, Right upper leg, Left upper leg, Face   Body parts bathed by helper: Buttocks, Right lower leg, Left lower leg     Bathing assist Assist Level: Moderate Assistance - Patient 50 - 74%     Upper Body Dressing/Undressing Upper body dressing   What is the patient wearing?: Pull over shirt    Upper body assist Assist Level: Set up assist    Lower Body Dressing/Undressing Lower body dressing      What is the patient wearing?: Underwear/pull up, Pants(helped put suspenders )     Lower body assist Assist for lower body dressing: Moderate Assistance - Patient 50 - 74%     Toileting Toileting    Toileting assist Assist for toileting: Moderate Assistance - Patient 50 - 74%     Transfers Chair/bed transfer  Transfers assist     Chair/bed transfer assist level: Minimal Assistance - Patient > 75%     Locomotion Ambulation   Ambulation assist      Assist level: Minimal Assistance - Patient > 75% Assistive device: Walker-rolling Max distance: 60  Walk 10 feet activity   Assist     Assist level: Minimal Assistance - Patient > 75% Assistive device: Walker-rolling   Walk 50 feet activity   Assist    Assist level: Minimal Assistance - Patient > 75% Assistive device: Walker-rolling    Walk 150 feet activity   Assist Walk 150 feet activity did not occur: Safety/medical concerns         Walk 10 feet on uneven surface  activity   Assist     Assist level: Minimal Assistance - Patient > 75% Assistive device: Aeronautical engineer  Will patient use wheelchair at discharge?: No      Wheelchair assist level: Moderate Assistance - Patient 50 - 74% Max wheelchair distance: 60    Wheelchair 50 feet with 2 turns activity    Assist        Assist Level: Moderate Assistance - Patient 50 - 74%   Wheelchair 150 feet activity     Assist  Wheelchair 150 feet activity did not occur: Safety/medical concerns       Blood pressure 129/73, pulse 94, temperature 97.7 F (36.5 C), temperature source Oral, resp. rate 20, weight 109.6 kg, SpO2 100 %.  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 Antithrombotics: -DVT/anticoagulation:SCD -antiplatelet therapy: N/A 3. 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 Mood:[facility]ita motional support -antipsychotic agents: N/A 5. Neuropsych: This patientisgenerallycapable of making decisions on hisown behalf. 6. Skin/Wound Care:Routine skin checks 7. Fluids/Electrolytes/Nutrition:Routine in and outs with follow-up chemistries 8. Atrial fibrillation. Unsuccessful cardioversion. Continue Cardizem CD 240 mg twice daily. Follow-up cardiology services. No plan to resume anticoagulation at this time due to acute chest wall hematoma -heart rate controlled at present 9. Acute blood loss anemia. Continue iron supplement. Follow-up CBC 10. Hyperlipidemia. Lipitor 11. Diabetes mellitus. Hemoglobin A1c 6.9.  SSI/Glucophage 500 mg daily -monitor cbg's ac/hs -adjust regimen as needed 12. CAD with CABG 2004. No chest pain or increasing shortness of breath. 13. History of CVA with left-sided residual weakness. Again no anticoagulation at this time 14. Hypothyroidism. Synthroid 15. OSA. Patient did use 2 L oxygen nightly prior to admission. 16.Hyperlipidemia.Lipitor  17.  Social lives with wife, grown children and grandchildren in home as well  9. Severe constipation- possible stool ball- ordered Mg citrate to be followed with supp or fleets enema in 3-4 hrs- hopefully will help.  LOS: 2 days A FACE TO FACE EVALUATION WAS PERFORMED  Rashauna Tep 12/18/2018, 3:30 PM

## 2018-12-18 NOTE — Plan of Care (Signed)
  Problem: Consults Goal: RH GENERAL PATIENT EDUCATION Description: See Patient Education module for education specifics. Outcome: Progressing   Problem: RH BOWEL ELIMINATION Goal: RH STG MANAGE BOWEL WITH ASSISTANCE Description: STG Manage Bowel with mod I Assistance. Outcome: Progressing   Problem: RH BLADDER ELIMINATION Goal: RH STG MANAGE BLADDER WITH ASSISTANCE Description: STG Manage Bladder With min Assistance Outcome: Progressing   Problem: RH SAFETY Goal: RH STG ADHERE TO SAFETY PRECAUTIONS W/ASSISTANCE/DEVICE Description: STG Adhere to Safety Precautions With cues/reminders Assistance/Device. Outcome: Progressing   Problem: RH PAIN MANAGEMENT Goal: RH STG PAIN MANAGED AT OR BELOW PT'S PAIN GOAL Description: At or below level 5 Outcome: Progressing   Problem: RH KNOWLEDGE DEFICIT GENERAL Goal: RH STG INCREASE KNOWLEDGE OF SELF CARE AFTER HOSPITALIZATION Description: Pt and wife will be able to manage care at discharge using handouts and educational materials independently Outcome: Progressing

## 2018-12-19 ENCOUNTER — Other Ambulatory Visit: Payer: Self-pay | Admitting: Family

## 2018-12-19 ENCOUNTER — Inpatient Hospital Stay (HOSPITAL_COMMUNITY): Payer: Medicare Other | Admitting: Occupational Therapy

## 2018-12-19 ENCOUNTER — Inpatient Hospital Stay (HOSPITAL_COMMUNITY): Payer: Medicare Other

## 2018-12-19 LAB — GLUCOSE, CAPILLARY
Glucose-Capillary: 119 mg/dL — ABNORMAL HIGH (ref 70–99)
Glucose-Capillary: 105 mg/dL — ABNORMAL HIGH (ref 70–99)
Glucose-Capillary: 118 mg/dL — ABNORMAL HIGH (ref 70–99)
Glucose-Capillary: 126 mg/dL — ABNORMAL HIGH (ref 70–99)

## 2018-12-19 MED ORDER — MAGNESIUM CITRATE PO SOLN
1.0000 | Freq: Every day | ORAL | Status: DC | PRN
  Administered 2018-12-21: 1 via ORAL
  Filled 2018-12-19: qty 296

## 2018-12-19 NOTE — Plan of Care (Signed)
  Problem: Consults Goal: RH GENERAL PATIENT EDUCATION Description: See Patient Education module for education specifics. Outcome: Progressing   Problem: RH BOWEL ELIMINATION Goal: RH STG MANAGE BOWEL WITH ASSISTANCE Description: STG Manage Bowel with mod I Assistance. Outcome: Progressing   Problem: RH BLADDER ELIMINATION Goal: RH STG MANAGE BLADDER WITH ASSISTANCE Description: STG Manage Bladder With min Assistance Outcome: Progressing   Problem: RH SAFETY Goal: RH STG ADHERE TO SAFETY PRECAUTIONS W/ASSISTANCE/DEVICE Description: STG Adhere to Safety Precautions With cues/reminders Assistance/Device. Outcome: Progressing   Problem: RH PAIN MANAGEMENT Goal: RH STG PAIN MANAGED AT OR BELOW PT'S PAIN GOAL Description: At or below level 5 Outcome: Progressing   Problem: RH KNOWLEDGE DEFICIT GENERAL Goal: RH STG INCREASE KNOWLEDGE OF SELF CARE AFTER HOSPITALIZATION Description: Pt and wife will be able to manage care at discharge using handouts and educational materials independently Outcome: Progressing  Needs reminders to call for help, before trying to get up on own.

## 2018-12-19 NOTE — Progress Notes (Signed)
Physical Therapy Session Note  Patient Details  Name: [name] MRN: [id number] Date of Birth: [birth year]  Today's Date: 12/19/2018 PT Individual Time: 0905-1000 PT Individual Time Calculation (min): 55 min   Short Term Goals: Week 1:  PT Short Term Goal 1 (Week 1): STG=LTG due to estimated LOS  Skilled Therapeutic Interventions/Progress Updates:    Pt supine in bed upon PT arrival, agreeable to therapy tx and denies pain. Pt transferred to sitting EOB with supervision and donned shoes with supervision. Pt performed stand pivot to w/c with RW and CGA, pt reports "I don't need that oxygen, I feel fine." Therapist discontinued nasal canula, SpO2 98% on RA. Pt transported to the gym. Pt performed stand pivot this session from w/c<>nustep with RW and CGA. Pt used nustep x4 minutes (on RA pts SpO2 83%) and x3 minutes (therapist donned nasal canula on 2L O2/min, pt's SpO2 99%) on resistance level 5 for global strengthening and endurance. Therapist turned O2 down to 1L O2/min and then ambulated x 100 ft with RW and CGA working on endurance and gait, SpO2 96% following activity. Pt performed 2 x 5 sit<>stands from mat without AD working on LE strength and endurance, cues for techniques, SpO2 96%. Pt ambulated x 85 ft with RW and CGA, cues for upright posture and L foot clearance. Pt worked on dynamic standing balance and foot clearance to perform toe taps on aerobic step x 10 per LE with UE support on RW, min guard. Pt ambulated back to w/c x 20 ft with RW and CGA, transported back to room and left seated in w/c with needs in reach and chair alarm set.   Therapy Documentation Precautions:  Precautions Precautions: Fall Restrictions Weight Bearing Restrictions: No  Therapy/Group: Individual Therapy   Netta Corrigan, PT, DPT, CSRS 12/19/18  7:58 AM

## 2018-12-19 NOTE — IPOC Note (Signed)
Overall Plan of Care [facility]) Patient Details Name: [name] MRN: [id number] DOB: May 10, 1945  Admitting Diagnosis: Acute encephalopathy  Hospital Problems: Principal Problem:   Acute encephalopathy     Functional Problem List: Nursing Bladder, Bowel, Pain, Edema, Endurance, Skin Integrity  PT Balance, Pain, Perception, Safety, Endurance, Motor, Sensory  OT Balance, Cognition, Edema, Endurance, Motor, Safety, Perception, Sensory, Skin Integrity  SLP    TR         Basic ADL's: OT Grooming, Bathing, Dressing, Toileting     Advanced  ADL's: OT Simple Meal Preparation     Transfers: PT Bed Mobility, Bed to Chair, Car, Furniture, Floor  OT Toilet, Metallurgist: PT Ambulation, Emergency planning/management officer, Stairs     Additional Impairments: OT Fuctional Use of Upper Extremity  SLP None      TR      Anticipated Outcomes Item Anticipated Outcome  Self Feeding No goal  Swallowing      Basic self-care  Supervision-Min A  Toileting  Supervision   Bathroom Transfers Supervision  Bowel/Bladder  to be continent x 2 LBM 12/15/18  Transfers  supervision with LRAD  Locomotion  supervision with LRAD  Communication     Cognition     Pain  less than 2  Safety/Judgment  to be fall free while in rehab   Therapy Plan: PT Intensity: Minimum of 1-2 x/day ,45 to 90 minutes PT Frequency: 5 out of 7 days PT Duration Estimated Length of Stay: 7-10 days OT Intensity: Minimum of 1-2 x/day, 45 to 90 minutes OT Frequency: 5 out of 7 days OT Duration/Estimated Length of Stay: 10-12 days     Due to the current state of emergency, patients may not be receiving their 3-hours of Medicare-mandated therapy.   Team Interventions: Nursing Interventions Patient/Family Education, Discharge Planning, Bladder Management, Pain Management, Bowel Management  PT interventions Ambulation/gait training, Discharge planning, DME/adaptive equipment instruction, Functional mobility  training, Pain management, Therapeutic Activities, UE/LE Strength taining/ROM, Wheelchair propulsion/positioning, UE/LE Coordination activities, Therapeutic Exercise, Stair training, Patient/family education, Neuromuscular re-education, Disease management/prevention, Academic librarian, Training and development officer  OT Interventions Training and development officer, Engineer, drilling, Patient/family education, Therapeutic Activities, Cognitive remediation/compensation, Psychosocial support, Therapeutic Exercise, Wheelchair propulsion/positioning, Community reintegration, Functional mobility training, Self Care/advanced ADL retraining, UE/LE Strength taining/ROM, UE/LE Coordination activities, Skin care/wound managment, Neuromuscular re-education, Discharge planning, Disease mangement/prevention, Pain management, Visual/perceptual remediation/compensation  SLP Interventions    TR Interventions    SW/CM Interventions Discharge Planning, Psychosocial Support, Patient/Family Education   Barriers to Discharge MD  Medical stability, Weight bearing restrictions, Medication compliance and constipation, cognition, and back/buttock pain  Nursing      PT Inaccessible home environment, Medical stability, Decreased caregiver support, Home environment access/layout    OT Medical stability    SLP      SW       Team Discharge Planning: Destination: PT-Home ,OT- Home , SLP-Home Projected Follow-up: PT-Home health PT, OT-  Home health OT, SLP-None Projected Equipment Needs: PT-To be determined, OT- To be determined, SLP-None recommended by SLP Equipment Details: PT- , OT-  Patient/family involved in discharge planning: PT- Patient,  OT-Patient, SLP-Patient  MD ELOS: 7-10 days Medical Rehab Prognosis:  Good Assessment: Pt is a 73 yr old male with hx of 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     See Team Conference Notes for weekly updates to the plan of care

## 2018-12-19 NOTE — Progress Notes (Signed)
Occupational Therapy Session Note  Patient Details  Name: [name] MRN: [id number] Date of Birth: 07/07/1945  Today's Date: 12/19/2018 OT Individual Time: [phone] and 1433-1530 OT Individual Time Calculation (min): 73 min and 57 min  Short Term Goals: Week 1:  OT Short Term Goal 1 (Week 1): Pt will complete LB dressing with Mod A using AE as needed OT Short Term Goal 2 (Week 1): Pt will problem solve donning suspenders with min vcs OT Short Term Goal 3 (Week 1): Pt will complete sit<stand with supervision assist during self care tasks  Skilled Therapeutic Interventions/Progress Updates:    Pt greeted in bed, finishing up with morning medication. He was premedicated for Lt sided pain. Pt then opted to shave and dress this AM, declining bathing. 02 sats on 2L at rest 99%. Supine<sit completed with supervision and steady assist for short distance ambulatory transfer to w/c placed at sink using RW. He was placed on 1L before ambulating, with 02 sats lowering to 86%. Within 1 minute sats increased to 97%. He removed the [place] for shaving. Pt required increased time for problem solving and opening shaving cream during task. Note that after 20 minutes, 02 sats reassessed and were 78-80%. Placed him back on 1L but sats still weren't reaching 90, so bumped him up to 2L once again. O2 sats then reached 100%. LB dressing completed with Min A sit<stand at the sink. Min vcs for dressing L LE first and donning shirt before pulling up pants with suspenders. He was able to problem solve suspenders himself today. Pt then ambulated back to bed using RW with steady assist. Supervision for transition to supine. Pt remained comfortably in bed with HOB elevated, on 2L with 02 >90%.   Note that all tasks required increased time due to pts tangential speech and talkative nature. Difficult to assess 02 accurately at times and pt required vcs to breathe vs talk when trying to increase 02 levels at rest.   2nd Session  1:1 tx (57 min) Pt greeted in his w/c and premedicated for pain. ADL needs met and therefore we worked on increasing independence with LB dressing. Blocked practice with threading both LEs into underwear and pants, dressing L LE first. He had a tough time with his underwear, but was able to meet demands of task with supervision given increased time. Note that 02 was bumped from 1L to 2L due to desatting into high 70s low 80s range. Even on 2L he desatted to mid 80s and needed several minutes to increase saturation to 90-92%. Pt is planning to have his granddaughter's boyfriend fashion a compression stocking sock-aide device for him to use at home. OT donned his compression stockings during session. Note that all functional activity required increased time due to pts tangential speech and talkative nature. Vcs throughout session for redirection to task. He was left in the w/c with all needs within reach and safety belt fastened.   Therapy Documentation Precautions:  Precautions Precautions: Fall Restrictions Weight Bearing Restrictions: No ADL: ADL Eating: Not assessed Grooming: Not assessed Upper Body Bathing: Supervision/safety Where Assessed-Upper Body Bathing: Edge of bed Lower Body Bathing: Moderate assistance Where Assessed-Lower Body Bathing: Edge of bed Upper Body Dressing: Setup Where Assessed-Upper Body Dressing: Edge of bed Lower Body Dressing: Maximal assistance Where Assessed-Lower Body Dressing: Edge of bed Toileting: Not assessed Toilet Transfer: Contact guard Toilet Transfer Method: Ambulating(RW) Gaffer Transfer: Not assessed     Therapy/Group: Individual Therapy  Nicollette Wilhelmi A Damonte Frieson 12/19/2018, 12:12  PM

## 2018-12-19 NOTE — Progress Notes (Signed)
[facility] PHYSICAL MEDICINE & REHABILITATION PROGRESS NOTE   Subjective/Complaints:  Pt reports had 2 BMs since yesterday- refused Mg citrate, but thinks might need in future- asked me to keep it prn.  Also having buttock/middle of back pain that Dr Letta Pate has treated for him in past with injections- sounds like he wants injections again.    ROS- denies CP, SOB (wears O2 at home only at noc , now continuous) Objective:   No results found. Recent Labs    12/17/18 0632  WBC 8.9  HGB 9.2*  HCT 27.9*  PLT 204   Recent Labs    12/17/18 0632  NA 136  K 3.8  CL 97*  CO2 30  GLUCOSE 117*  BUN 21  CREATININE 0.96  CALCIUM 8.5*    Intake/Output Summary (Last 24 hours) at 12/19/2018 1525 Last data filed at 12/19/2018 1300 Gross per 24 hour  Intake 1200 ml  Output 450 ml  Net 750 ml     Physical Exam: Vital Signs Blood pressure 134/60, pulse 88, temperature (!) 97.4 F (36.3 C), temperature source Oral, resp. rate 20, weight 109.2 kg, SpO2 92 %.  Vitals reviewed General: No acute distress; sitting up in chair at bedside wearing O2 by [place], NAD; B/L arm IVs; wearing suspenders Mood and affect are appropriate; extremely talkative. Heart: Regular rate and rhythm no rubs murmurs or extra sounds Lungs: Clear to auscultation, breathing unlabored, no rales or wheezes Abdomen: Positive bowel sounds, soft nontender to palpation, nondistended Extremities: No clubbing, cyanosis, or edema Skin: No evidence of breakdown, no evidence of rash Neurologic: Cranial nerves II through XII intact, motor strength is 5/5 in RIght and 4/5 left  deltoid, bicep, tricep, grip, hip flexor, knee extensors, ankle dorsiflexor and plantar flexor Sensory exam reduced  sensation to light touch left  upper and lower extremities Cerebellar exam dysmetria on left  Musculoskeletal: Full range of motion in all 4 extremities. No joint swelling   Assessment/Plan: 1. Functional deficits secondary to  Encephalopathy which require 3+ hours per day of interdisciplinary therapy in a comprehensive inpatient rehab setting.  Physiatrist is providing close team supervision and 24 hour management of active medical problems listed below.  Physiatrist and rehab team continue to assess barriers to discharge/monitor patient progress toward functional and medical goals  Care Tool:  Bathing  Bathing activity did not occur: Refused Body parts bathed by patient: Right arm, Left arm, Chest, Abdomen, Front perineal area, Right upper leg, Left upper leg, Face   Body parts bathed by helper: Buttocks, Right lower leg, Left lower leg     Bathing assist Assist Level: Moderate Assistance - Patient 50 - 74%     Upper Body Dressing/Undressing Upper body dressing   What is the patient wearing?: Pull over shirt    Upper body assist Assist Level: Set up assist    Lower Body Dressing/Undressing Lower body dressing      What is the patient wearing?: Underwear/pull up, Pants     Lower body assist Assist for lower body dressing: Minimal Assistance - Patient > 75%     Toileting Toileting    Toileting assist Assist for toileting: Independent with assistive device Assistive Device Comment: urinal   Transfers Chair/bed transfer  Transfers assist     Chair/bed transfer assist level: Contact Guard/Touching assist     Locomotion Ambulation   Ambulation assist      Assist level: Contact Guard/Touching assist Assistive device: Walker-rolling Max distance: 80 ft   Walk 10 feet  activity   Assist     Assist level: Contact Guard/Touching assist Assistive device: Walker-rolling   Walk 50 feet activity   Assist    Assist level: Contact Guard/Touching assist Assistive device: Walker-rolling    Walk 150 feet activity   Assist Walk 150 feet activity did not occur: Safety/medical concerns         Walk 10 feet on uneven surface  activity   Assist     Assist level: Minimal  Assistance - Patient > 75% Assistive device: Aeronautical engineer Will patient use wheelchair at discharge?: No      Wheelchair assist level: Moderate Assistance - Patient 50 - 74% Max wheelchair distance: 60    Wheelchair 50 feet with 2 turns activity    Assist        Assist Level: Moderate Assistance - Patient 50 - 74%   Wheelchair 150 feet activity     Assist  Wheelchair 150 feet activity did not occur: Safety/medical concerns       Blood pressure 134/60, pulse 88, temperature (!) 97.4 F (36.3 C), temperature source Oral, resp. rate 20, weight 109.2 kg, SpO2 92 %.  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 Antithrombotics: -DVT/anticoagulation:SCD -antiplatelet therapy: N/A 3. 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 Mood:[place]ita motional support -antipsychotic agents: N/A 5. Neuropsych: This patientisgenerallycapable of making decisions on hisown behalf. 6. Skin/Wound Care:Routine skin checks 7. Fluids/Electrolytes/Nutrition:Routine in and outs with follow-up chemistries 8. Atrial fibrillation. Unsuccessful cardioversion. Continue Cardizem CD 240 mg twice daily. Follow-up cardiology services. No plan to resume anticoagulation at this time due to acute chest wall hematoma -heart rate controlled at  present 9. Acute blood loss anemia. Continue iron supplement. Follow-up CBC 10. Hyperlipidemia. Lipitor 11. Diabetes mellitus. Hemoglobin A1c 6.9. SSI/Glucophage 500 mg daily -monitor cbg's ac/hs -adjust regimen as needed  -well controlled 10/4  CBG (last 3)  Recent Labs    12/18/18 2115 12/19/18 0625 12/19/18 1207  GLUCAP 110* 119* 105*    12. CAD with CABG 2004. No chest pain or increasing shortness of breath. 13. History of CVA with left-sided residual weakness. Again no anticoagulation at this time 14. Hypothyroidism. Synthroid 15. OSA. Patient did use 2 L oxygen nightly prior to admission. 16.Hyperlipidemia.Lipitor  17.  Social lives with wife, grown children and grandchildren in home as well  34. Severe constipation- possible stool ball- ordered Mg citrate to be followed with supp or fleets enema in 3-4 hrs- hopefully will help.  -will give Mg citrate daily prn for now- might need- sounds like had stool ball.  LOS: 3 days A FACE TO FACE EVALUATION WAS PERFORMED  Emilyrose Darrah 12/19/2018, 3:25 PM

## 2018-12-20 ENCOUNTER — Inpatient Hospital Stay (HOSPITAL_COMMUNITY): Payer: Medicare Other | Admitting: Occupational Therapy

## 2018-12-20 ENCOUNTER — Inpatient Hospital Stay (HOSPITAL_COMMUNITY): Payer: Medicare Other

## 2018-12-20 LAB — GLUCOSE, CAPILLARY
Glucose-Capillary: 124 mg/dL — ABNORMAL HIGH (ref 70–99)
Glucose-Capillary: 120 mg/dL — ABNORMAL HIGH (ref 70–99)
Glucose-Capillary: 119 mg/dL — ABNORMAL HIGH (ref 70–99)
Glucose-Capillary: 136 mg/dL — ABNORMAL HIGH (ref 70–99)

## 2018-12-20 MED ORDER — LIVING WELL WITH DIABETES BOOK
Freq: Once | Status: AC
  Administered 2018-12-20: 15:00:00
  Filled 2018-12-20: qty 1

## 2018-12-20 MED ORDER — SENNOSIDES-DOCUSATE SODIUM 8.6-50 MG PO TABS
1.0000 | ORAL_TABLET | Freq: Two times a day (BID) | ORAL | Status: DC
  Administered 2018-12-20 – 2018-12-21 (×2): 1 via ORAL
  Filled 2018-12-20 (×2): qty 1

## 2018-12-20 NOTE — Progress Notes (Signed)
Occupational Therapy Session Note  Patient Details  Name: [name] MRN: [id number] Date of Birth: 1945/08/03  Today's Date: 12/20/2018 OT Individual Time: 1400-1411 OT Individual Time Calculation (min): 11 min  50 missed minutes- fatigue  Short Term Goals: Week 1:  OT Short Term Goal 1 (Week 1): Pt will complete LB dressing with Mod A using AE as needed OT Short Term Goal 2 (Week 1): Pt will problem solve donning suspenders with min vcs OT Short Term Goal 3 (Week 1): Pt will complete sit<stand with supervision assist during self care tasks  Skilled Therapeutic Interventions/Progress Updates:    Upon entering the room, pt sleeping soundly and difficult to awaken for participation. Pt did open eyes once and said, "Woke up early on my own". OT unable to arouse pt for further education/encouragement for participation. Pt sleeping soundly upon exiting the room.  50 missed minutes secondary to fatigue.  Therapy Documentation Precautions:  Precautions Precautions: Fall Restrictions Weight Bearing Restrictions: No General: General OT Amount of Missed Time: 50 Minutes Vital Signs: Oxygen Therapy SpO2: 100 % O2 Device: Room Air Pain: Pain Assessment Pain Score: 0-No pain ADL: ADL Eating: Not assessed Grooming: Not assessed Upper Body Bathing: Supervision/safety Where Assessed-Upper Body Bathing: Edge of bed Lower Body Bathing: Moderate assistance Where Assessed-Lower Body Bathing: Edge of bed Upper Body Dressing: Setup Where Assessed-Upper Body Dressing: Edge of bed Lower Body Dressing: Maximal assistance Where Assessed-Lower Body Dressing: Edge of bed Toileting: Not assessed Toilet Transfer: Contact guard Toilet Transfer Method: Ambulating(RW) Gaffer Transfer: Not assessed   Therapy/Group: Individual Therapy  Gypsy Decant 12/20/2018, 2:16 PM

## 2018-12-20 NOTE — Progress Notes (Signed)
Social Work Assessment and Plan   Patient Details  Name: [name] MRN: [id number] Date of Birth: 1945/09/25  Today's Date: 12/20/2018  Problem List:  Patient Active Problem List   Diagnosis Date Noted  . Chest wall hematoma 12/16/2018  . Delirium 12/16/2018  . Blood loss anemia 12/16/2018  . Dysuria 12/16/2018  . Acute encephalopathy 12/16/2018  . Acute on chronic diastolic heart failure ([place])   . Anticoagulated on warfarin   . Elevated troponin   . Congestive heart failure ([place])   . Abnormal INR   . AKI (acute kidney injury) ([place])   . History of gout   . History of hypothyroidism   . History of type 2 diabetes mellitus   . Atrial fibrillation with rapid ventricular response ([place]) 11/27/2018  . Ischemic chest pain ([place]) 02/17/2018  . Pure hypercholesterolemia 05/01/2017  . Thoracic aorta atherosclerosis ([place]) 04/27/2017  . Stroke (cerebrum) ([place]) 01/13/2017  . RUQ abdominal pain 01/13/2017  . Paroxysmal atrial fibrillation ([place]) 12/06/2016  . Dyspnea on exertion 10/21/2016  . Acute respiratory failure with hypoxia and hypercapnia ([place]) 12/12/2015  . Coronary 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 ([place]) 11/04/2015  . CAD S/P CFX DES 2011 04/25/2015  . Persistent atrial fibrillation 04/25/2015  . Vasovagal syncope   . Constipation 04/24/2015  . Chronic diastolic heart failure ([place]) 04/24/2015  . B12 deficiency 08/11/2013  . Sensory disturbance 08/11/2013  . Ataxia 08/10/2013  . Laceration of ear, external, right 07/05/2013  . Insomnia 05/21/2013  . Hypothyroidism, acquired 02/15/2013  . Weakness 11/17/2012  . Spastic hemiplegia affecting dominant side ([place]) 07/21/2011  . Long term current use of anticoagulant therapy 06/24/2011  . Hypogonadism male 04/10/2011  . Fatigue 03/31/2011  . Allergic rhinitis 02/13/2010  . BENIGN POSITIONAL VERTIGO 11/23/2009  . 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 Hypothyroidism 04/02/2009  . Back pain 01/29/2009  .  Type 2 diabetes mellitus without complication, without long-term current use of insulin ([place]) 10/05/2007  . Obstructive sleep apnea-Failed CPAP 06/17/2007  . Hyperlipidemia LDL goal <70 12/12/2006  . GOUT 12/12/2006  . Essential hypertension 12/12/2006  . History of stroke with residual deficit 12/12/2006  . GERD 12/12/2006   Past Medical History:  Past Medical History:  Diagnosis Date  . Acute encephalopathy 12/12/2015  . Acute respiratory failure with hypoxia and hypercapnia ([place]) 12/12/2015  . Allergic rhinitis 02/13/2010   Qualifier: Diagnosis of  By: Wynona Luna   . Angina at rest [facility]) 10/22/2015  . Ataxia 08/10/2013  . Atrial fibrillation ([place]) 11/2018  . B12 deficiency 08/11/2013  . BACK PAIN, LUMBAR 01/29/2009   Qualifier: Diagnosis of  By: Wynona Luna   . BENIGN POSITIONAL VERTIGO 11/23/2009   Qualifier: Diagnosis of  By: Wynona Luna   . Chronic diastolic heart failure ([place]) 04/24/2015  . Chronic pain    left sided-[place]  . Coronary 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 ([place]) 11/04/2015   s/p CABG 2004; CFX DES 2011  . Essential hypertension 12/12/2006   Qualifier: Diagnosis of  By: [name] RMA, [place]    . GERD 12/12/2006   Qualifier: Diagnosis of  By: [name] RMA, [place]    . GOUT 12/12/2006   Qualifier: Diagnosis of  By: Reatha Armour, [place]    . Hyperlipidemia LDL goal <70 12/12/2006   Qualifier: Diagnosis of  By: [name] RMA, [place]     . Hypogonadism male 04/10/2011  . Hypothyroidism, acquired 02/15/2013  .  Insomnia 05/21/2013  . Long term current use of anticoagulant therapy 06/24/2011  . Obstructive sleep apnea-Failed CPAP 06/17/2007   Failed CPAP Using O 2 for sleep   . Paroxysmal atrial fibrillation ([place]) 12/06/2016  . 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 Thoracic aorta atherosclerosis ([place]) 04/27/2017  . Tubular adenoma of colon 05/2010  . Type 2 diabetes mellitus without complication, without  long-term current use of insulin ([place]) 10/05/2007   Qualifier: Diagnosis of  By: Wynona Luna   . Vasovagal syncope    Past Surgical History:  Past Surgical History:  Procedure Laterality Date  . CARDIAC CATHETERIZATION N/A 10/25/2015   Procedure: Left Heart Cath and Cors/Grafts Angiography;  Surgeon: Troy Sine, MD;  Location: [facility] CV LAB;  Service: Cardiovascular;  Laterality: N/A;  . CARDIOVERSION  06/20/2011   Procedure: CARDIOVERSION;  Surgeon: Larey Dresser, MD;  Location: [facility] ENDOSCOPY;  Service: Cardiovascular;  Laterality: N/A;  . CARDIOVERSION N/A 04/26/2015   Procedure: CARDIOVERSION;  Surgeon: Dorothy Spark, MD;  Location: [facility] ENDOSCOPY;  Service: Cardiovascular;  Laterality: N/A;  . CARDIOVERSION N/A 05/10/2015   Procedure: CARDIOVERSION;  Surgeon: Larey Dresser, MD;  Location: [place];  Service: Cardiovascular;  Laterality: N/A;  . CARDIOVERSION N/A 12/10/2018   Procedure: CARDIOVERSION;  Surgeon: Sanda Klein, MD;  Location: [facility];  Service: Cardiovascular;  Laterality: N/A;  . CATARACT EXTRACTION  05/2010   left eye  . CATARACT EXTRACTION  04/2010   right eye  . CORONARY ARTERY BYPASS GRAFT     stent  . ESOPHAGOGASTRODUODENOSCOPY  03-25-2005  . LEFT HEART CATH AND CORS/GRAFTS ANGIOGRAPHY N/A 02/19/2018   Procedure: LEFT HEART CATH AND CORS/GRAFTS ANGIOGRAPHY;  Surgeon: Martinique, Peter M, MD;  Location: [facility] CV LAB;  Service: Cardiovascular;  Laterality: N/A;  . NASAL SEPTUM SURGERY    . PERCUTANEOUS PLACEMENT INTRAVASCULAR STENT CERVICAL CAROTID ARTERY     03-2009; using a drug-eluting platform of the circumflex cornoray artery with a 3.0 x 18 [employer] Promus drug-eluting platform post dilated to 3.75 with a noncompliant balloon.  . TEE WITHOUT CARDIOVERSION  06/20/2011   Procedure: TRANSESOPHAGEAL ECHOCARDIOGRAM (TEE);  Surgeon: Larey Dresser, MD;  Location: [place];  Service: Cardiovascular;  Laterality: N/A;   Social  History:  reports that he has never smoked. He has never used smokeless tobacco. He reports that he does not drink alcohol or use drugs.  Family / Support Systems Marital Status: Married Patient Roles: Spouse, Parent Spouse/Significant Other: [name] [id number]  [phone] Children: Maryann Weaver-daughter [id number] Other Supports: Friends Anticipated Caregiver: Wife, daughter, grandchildren Ability/Limitations of Caregiver: Wife is very Warehouse manager Availability: 24/7 Family Dynamics: Close knit family who are willing to assist him and do waht is needed. They have friends and church members who are involved and willing to visit and provide support  Social History Preferred language: English Religion: Catholic Cultural Background: No issues Education: HS Read: Yes Write: Yes Employment Status: Retired Public relations account executive Issues: No issues Guardian/Conservator: None-according to MD pt is capable of making his own decisions while here   Abuse/Neglect Abuse/Neglect Assessment Can Be Completed: Yes Physical Abuse: Denies Verbal Abuse: Denies Sexual Abuse: Denies Exploitation of patient/patient's resources: Denies Self-Neglect: Denies  Emotional Status Pt's affect, behavior and adjustment status: Pt has always been independent even when requiring O2 and can do for himself. He was driving and doing his own yardwork. He wants to get back to this and  not need care from others. Recent Psychosocial Issues: other health issues still remained mod/i Psychiatric History: No history deferred depression screen due to coping appropriately. Will monitor while here and see if needs neuro-psych while here Substance Abuse History: No issues  Patient / Family Perceptions, Expectations & Goals Pt/Family understanding of illness & functional limitations: Pt and wife can explain his health issues and reason for hospitalization. He feels he is doing well and will be going home soon and  said the MD told him this also. 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, father, Radiographer, therapeutic, church member, retiree, Social research officer, government Anticipated changes in roles/activities/participation: resume Pt/family expectations/goals: Pt states: " I plan on going home soon and being independent like I was."  Wife states: " We will all help him but know how stubborn he is and will do on his own."  [employer]: None Premorbid Home Care/DME Agencies: Other (Comment)(Has Home O2 already-[facility] in [place]) Transportation available at discharge: family  Discharge Planning Living Arrangements: Spouse/significant other Support Systems: Spouse/significant other, Children, Other relatives, Friends/neighbors, Social worker community Type of Residence: Private residence Insurance Resources: [employer], Multimedia programmer (specify) Financial Resources: [place] Referred: No Living Expenses: Own Money Management: Patient, Spouse Does the patient have any problems obtaining your medications?: No Home Management: All Patient/Family Preliminary Plans: Return home with wife and family members to assist if needed. He wants to go home soon and be back ot his life. Discussed team conference Wed and will know DC date and goals. Social Work Anticipated Follow Up Needs: HH/OP  Clinical Impression Pleasant very talkative gentleman who is motivated to return to his prior level and wants to go home soon. Wife and daughter along with adult grandchildren will be able to assist. Work on discharge needs.  Elease Hashimoto 12/20/2018, 12:24 PM

## 2018-12-20 NOTE — Progress Notes (Signed)
Physical Therapy Session Note  Patient Details  Name: [name] MRN: [id number] Date of Birth: 08-11-1945  Today's Date: 12/20/2018 PT Individual Time: 1631-1700 PT Individual Time Calculation (min): 29 min   Short Term Goals: Week 1:  PT Short Term Goal 1 (Week 1): STG=LTG due to estimated LOS  Skilled Therapeutic Interventions/Progress Updates:    Pt supine in bed upon PT arrival, agreeable to therapy tx and denies pain. Pt transferred to sitting EOB with supervision and donned shoes. Pt performed stand pivot to w/c with CGA and RW. Pt maintained on RA throughout the session, monitoring SpO2 throughout with activity. Pt transported to the gym. Pt ambulated 2 x 89 ft with RW and CGA this session, cues for breathing techniques, upright posture and L foot clearance. SpO2 94-99% following gait. Pt reports having to use the bathroom. Pt transported back to room in w/c. Pt ambulated from w/c<>bathroom 2 x 10 ft this session with RW and CGA. Pt maintained standing balance with CGA while performing all clothing management, continent of bladder. Pt left seated in w/c at end of session with needs in reach and chair alarm set.   Therapy Documentation Precautions:  Precautions Precautions: Fall Restrictions Weight Bearing Restrictions: No    Therapy/Group: Individual Therapy   Netta Corrigan, PT, DPT, CSRS 12/20/18  12:35 PM

## 2018-12-20 NOTE — Progress Notes (Signed)
Noticed pt was wearing oxygen. Pt reports that it drops while in therapy. Informed him that O2 SATs during the days have been good. Removed oxygen and SAT was 100 while in bed. Asked if wears CPAP at home said no. Informed that really needs to wear because when doesn't it increases risk of stroke and has hx of respiratory distress. Pt reports that feels better if wears O2 all the time. Informed him that his body doesn't need it and can cause more problems. Agreed to take off O2 during the day. Said his doctor in [place] never told him that not wearing CPAP increase risk of stroke. Provided hand out instructions for sticking his finger. Pt also reported that getting constipated and request Senna. He reports that takes at one in the morning and one at night. Also requesting his Allopurinol for his gout. Reports that should be getting it. Informed him that I would let provider know.

## 2018-12-20 NOTE — Care Management (Signed)
[facility] Individual Statement of Services  Patient Name:  [name]  Date:  12/20/2018  Welcome to the [facility].  Our goal is to provide you with an individualized program based on your diagnosis and situation, designed to meet your specific needs.  With this comprehensive rehabilitation program, you will be expected to participate in at least 3 hours of rehabilitation therapies Monday-Friday, with modified therapy programming on the weekends.  Your rehabilitation program will include the following services:  Physical Therapy (PT), Occupational Therapy (OT), Speech Therapy (ST), 24 hour per day rehabilitation nursing, Case Management (Social Worker), Rehabilitation Medicine, Nutrition Services and Pharmacy Services  Weekly team conferences will be held on Wednesday to discuss your progress.  Your Social Worker will talk with you frequently to get your input and to update you on team discussions.  Team conferences with you and your family in attendance may also be held.  Expected length of stay: 10-12 days  Overall anticipated outcome: supervision-min level  Depending on your progress and recovery, your program may change. Your Social Worker will coordinate services and will keep you informed of any changes. Your Social Worker's name and contact numbers are listed  below.  The following services may also be recommended but are not provided by the [facility] will be made to provide these services after discharge if needed.  Arrangements include referral to agencies that provide these services.  Your insurance has been verified to be:  Medicare & [employer] Your primary doctor is:  Debbrah Alar  Pertinent information will be shared with your doctor and your insurance company.  Social Worker:   Ovidio Kin, [place] or ([phone]  Information discussed with and copy given to patient by: Elease Hashimoto, 12/20/2018, 12:09 PM

## 2018-12-20 NOTE — Progress Notes (Signed)
Occupational Therapy Session Note  Patient Details  Name: [name] MRN: [id number] Date of Birth: [birth year]  Today's Date: 12/20/2018 OT Individual Time: 1594-7076 OT Individual Time Calculation (min): 45 min    Short Term Goals: Week 1:  OT Short Term Goal 1 (Week 1): Pt will complete LB dressing with Mod A using AE as needed OT Short Term Goal 2 (Week 1): Pt will problem solve donning suspenders with min vcs OT Short Term Goal 3 (Week 1): Pt will complete sit<stand with supervision assist during self care tasks  Skilled Therapeutic Interventions/Progress Updates:    Pt received in bed and was sleeping soundly.  It took him a few minutes to wake up, but then he was able to sit to EOB with min A. Pt did feel dizzy initially but after sitting a few minutes it resolved.  He used RW to stand from bed and transfer to wc at sink with CGA.   Sat at sink to complete grooming. He asked good questions about hospital shaving policies, recalling what he was told on the other unit.  Pt was very talkative this session so it took a great deal of time for him to get just a few tasks completed. He opted to sit up in w/c at end of session.  Belt alarm on and all needs met.    Therapy Documentation Precautions:  Precautions Precautions: Fall Restrictions Weight Bearing Restrictions: No  Pain: Pain Assessment Pain Score: 0-No pain   Therapy/Group: Individual Therapy  [place] 12/20/2018, 12:45 PM

## 2018-12-20 NOTE — Progress Notes (Signed)
Received call from pt wife [name], wanted to know if could have visitors. Informed her no but was told that wanted to change contact person to her. Informed that if change contact, will be one and only time that will allow. She continues on to say that has a lot to do and that daughter and grand daughter are helping her and she has hard time hearing through mask. Informed to discuss with daughter but sounds like she is trying to help out and would be more difficult for you to come up here anyway. She agreed. Confirmed that pt has glucometer at home but doesn't use and wife has no interest in learning. However there is a family member that can show them how to check CBG at home.

## 2018-12-20 NOTE — Progress Notes (Signed)
Pt requested O2.  SAT 100%, informed doesn't need it. Talked to pt again about diabetes and started shaking his head. Pt said, "if I was 53 I would go with it but i'm not". Pt has no interest in checking his blood sugar or giving any insulin. No teaching or leaning done.

## 2018-12-20 NOTE — Progress Notes (Signed)
[facility] PHYSICAL MEDICINE & REHABILITATION PROGRESS NOTE   Subjective/Complaints:  Discussed blood sugar monitoring     ROS- denies CP, SOB (wears O2 at home only at noc , now continuous) Objective:   No results found. No results for input(s): WBC, HGB, HCT, PLT in the last 72 hours. No results for input(s): NA, K, CL, CO2, GLUCOSE, BUN, CREATININE, CALCIUM in the last 72 hours.  Intake/Output Summary (Last 24 hours) at 12/20/2018 0749 Last data filed at 12/20/2018 0454 Gross per 24 hour  Intake 720 ml  Output 350 ml  Net 370 ml     Physical Exam: Vital Signs Blood pressure 110/64, pulse 63, temperature (!) 97.5 F (36.4 C), temperature source Oral, resp. rate 18, weight 110.4 kg, SpO2 94 %.  Vitals reviewed General: No acute distress; sitting up in chair at bedside wearing O2 by [place], NAD; B/L arm IVs; wearing suspenders Mood and affect are appropriate; extremely talkative. Heart: Regular rate and rhythm no rubs murmurs or extra sounds Lungs: Clear to auscultation, breathing unlabored, no rales or wheezes Abdomen: Positive bowel sounds, soft nontender to palpation, nondistended Extremities: No clubbing, cyanosis, or edema Skin: No evidence of breakdown, no evidence of rash Neurologic: Cranial nerves II through XII intact, motor strength is 5/5 in RIght and 4/5 left  deltoid, bicep, tricep, grip, hip flexor, knee extensors, ankle dorsiflexor and plantar flexor unchanged Sensory exam reduced  sensation to light touch left  upper and lower extremities Cerebellar exam dysmetria on left  Musculoskeletal: Full range of motion in all 4 extremities. No joint swelling   Assessment/Plan: 1. Functional deficits secondary to Encephalopathy which require 3+ hours per day of interdisciplinary therapy in a comprehensive inpatient rehab setting.  Physiatrist is providing close team supervision and 24 hour management of active medical problems listed below.  Physiatrist and rehab team  continue to assess barriers to discharge/monitor patient progress toward functional and medical goals  Care Tool:  Bathing  Bathing activity did not occur: Refused Body parts bathed by patient: Right arm, Left arm, Chest, Abdomen, Front perineal area, Right upper leg, Left upper leg, Face   Body parts bathed by helper: Buttocks, Right lower leg, Left lower leg     Bathing assist Assist Level: Moderate Assistance - Patient 50 - 74%     Upper Body Dressing/Undressing Upper body dressing   What is the patient wearing?: Pull over shirt    Upper body assist Assist Level: Set up assist    Lower Body Dressing/Undressing Lower body dressing      What is the patient wearing?: Underwear/pull up, Pants     Lower body assist Assist for lower body dressing: Minimal Assistance - Patient > 75%     Toileting Toileting    Toileting assist Assist for toileting: Independent with assistive device Assistive Device Comment: urinal   Transfers Chair/bed transfer  Transfers assist     Chair/bed transfer assist level: Contact Guard/Touching assist     Locomotion Ambulation   Ambulation assist      Assist level: Contact Guard/Touching assist Assistive device: Walker-rolling Max distance: 80 ft   Walk 10 feet activity   Assist     Assist level: Contact Guard/Touching assist Assistive device: Walker-rolling   Walk 50 feet activity   Assist    Assist level: Contact Guard/Touching assist Assistive device: Walker-rolling    Walk 150 feet activity   Assist Walk 150 feet activity did not occur: Safety/medical concerns         Walk  10 feet on uneven surface  activity   Assist     Assist level: Minimal Assistance - Patient > 75% Assistive device: Aeronautical engineer Will patient use wheelchair at discharge?: No      Wheelchair assist level: Moderate Assistance - Patient 50 - 74% Max wheelchair distance: 60    Wheelchair 50  feet with 2 turns activity    Assist        Assist Level: Moderate Assistance - Patient 50 - 74%   Wheelchair 150 feet activity     Assist  Wheelchair 150 feet activity did not occur: Safety/medical concerns       Blood pressure 110/64, pulse 63, temperature (!) 97.5 F (36.4 C), temperature source Oral, resp. rate 18, weight 110.4 kg, SpO2 94 %.  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 Antithrombotics: -DVT/anticoagulation:SCD -antiplatelet therapy: N/A 3. 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 Mood:Vita motional support -antipsychotic agents: N/A 5. Neuropsych: This patientisgenerallycapable of making decisions on hisown behalf. 6. Skin/Wound Care:Routine skin checks 7. Fluids/Electrolytes/Nutrition:Routine in and outs with follow-up chemistries 8. Atrial fibrillation. Unsuccessful cardioversion. Continue Cardizem CD 240 mg twice daily. Follow-up cardiology services. No plan to resume anticoagulation at this time due to acute chest wall hematoma -heart rate controlled at present 9. Acute blood loss anemia. Continue iron supplement. Follow-up CBC 10. Hyperlipidemia. Lipitor 11. Diabetes mellitus. Hemoglobin A1c 6.9. SSI/Glucophage 500 mg daily -monitor cbg's ac/hs -adjust regimen as needed  -well  controlled 10/5  CBG (last 3)  Recent Labs    12/19/18 1651 12/19/18 2108 12/20/18 0633  GLUCAP 118* 126* 124*  May reduce freq of CBG checks 12. CAD with CABG 2004. No chest pain or increasing shortness of breath. 13. History of CVA with left-sided residual weakness. Again no anticoagulation at this time 14. Hypothyroidism. Synthroid 15. OSA. Patient did use 2 L oxygen nightly prior to admission. 16.Hyperlipidemia.Lipitor  17.  Social lives with wife, grown children and grandchildren in home as well  65. Severe constipation-pt states his bowels are ok again  LOS: 4 days A FACE TO FACE EVALUATION WAS PERFORMED  Charlett Blake 12/20/2018, 7:49 AM

## 2018-12-20 NOTE — Progress Notes (Signed)
Physical Therapy Session Note  Patient Details  Name: [name] MRN: [id number] Date of Birth: 1946-02-16  Today's Date: 12/20/2018 PT Individual Time:  -      Short Term Goals: Week 1:  PT Short Term Goal 1 (Week 1): STG=LTG due to estimated LOS  Skilled Therapeutic Interventions/Progress Updates:  Pt in bed, eating breakfast. On 2 L O2 via wall. Pt on 2L> 1 L during session, with O2 sats staying above 95%.   He c/o numbness entire L side of body, which has been present since CVA 1996, per pt.  Using bed features, pt sat up with min assist on L side side of bed.   Pt noted to have moderate non -pitting edema bil feet, with erythema L foot. Pt reported that he has been on gout meds x 14 years.    PT donned personal compression stockings with pt in sitting.  He donned slip on slippers.  PT informed Dr. Letta Pate regarding pt's LE edema and erythema.   Sit> stand from raised bed with supervision, to RW.  With min assist, pt fastned pants and suspenders.  Stand pivot to wc to R with CGA.   Stand pivot without RW to NuStep.  Use of Nustep at level 5 x 6 minutes for activity tolerance and general flexibility.  neuromuscular re-education via forced use, wt bearing and mulitmodal cues for ustained stretch and bil heel cord stretch x 3 min with forefeet standing on blue wedge, bil UE support on RW.   Gait training iwht RW on level tile x 60' with turns, CGA.  Pt reported needing to have BM.  Toilet transfer with CGA.  Clothing mgt with min assist.  At end of session, NT taking over supervision while pt on toilet.       Therapy Documentation Precautions:  Precautions Precautions: Fall Restrictions Weight Bearing Restrictions: No  Pain: pt denied        Therapy/Group: Individual Therapy  Calyn Rubi 12/20/2018, 7:51 AM

## 2018-12-21 ENCOUNTER — Inpatient Hospital Stay (HOSPITAL_COMMUNITY): Payer: Medicare Other | Admitting: Occupational Therapy

## 2018-12-21 ENCOUNTER — Inpatient Hospital Stay (HOSPITAL_COMMUNITY): Payer: Medicare Other | Admitting: Physical Therapy

## 2018-12-21 LAB — GLUCOSE, CAPILLARY
Glucose-Capillary: 108 mg/dL — ABNORMAL HIGH (ref 70–99)
Glucose-Capillary: 113 mg/dL — ABNORMAL HIGH (ref 70–99)
Glucose-Capillary: 98 mg/dL (ref 70–99)
Glucose-Capillary: 122 mg/dL — ABNORMAL HIGH (ref 70–99)

## 2018-12-21 MED ORDER — TIZANIDINE HCL 2 MG PO TABS
2.0000 mg | ORAL_TABLET | Freq: Every day | ORAL | Status: DC
  Administered 2018-12-21 – 2018-12-23 (×3): 2 mg via ORAL
  Filled 2018-12-21 (×3): qty 1

## 2018-12-21 MED ORDER — SENNOSIDES-DOCUSATE SODIUM 8.6-50 MG PO TABS
2.0000 | ORAL_TABLET | Freq: Two times a day (BID) | ORAL | Status: DC
  Administered 2018-12-21 – 2018-12-24 (×6): 2 via ORAL
  Filled 2018-12-21 (×6): qty 2

## 2018-12-21 MED ORDER — INSULIN ASPART 100 UNIT/ML ~~LOC~~ SOLN: 0.0000 [IU] | Freq: Two times a day (BID) | SUBCUTANEOUS | Status: DC

## 2018-12-21 MED ORDER — ALLOPURINOL 100 MG PO TABS
100.0000 mg | ORAL_TABLET | Freq: Two times a day (BID) | ORAL | Status: DC
  Administered 2018-12-21 – 2018-12-24 (×7): 100 mg via ORAL
  Filled 2018-12-21 (×6): qty 1

## 2018-12-21 NOTE — Progress Notes (Addendum)
[place] PHYSICAL MEDICINE & REHABILITATION PROGRESS NOTE   Subjective/Complaints:  No issues overnight, patient feels like he is ready to go home.  I discussed this with his physical therapist.  They think he should be ready by this weekend.    ROS- denies CP, SOB (wears O2 at home only at noc , now continuous) Objective:   No results found. No results for input(s): WBC, HGB, HCT, PLT in the last 72 hours. No results for input(s): NA, K, CL, CO2, GLUCOSE, BUN, CREATININE, CALCIUM in the last 72 hours.  Intake/Output Summary (Last 24 hours) at 12/21/2018 0840 Last data filed at 12/21/2018 0730 Gross per 24 hour  Intake 1043 ml  Output 200 ml  Net 843 ml     Physical Exam: Vital Signs Blood pressure 106/83, pulse 94, temperature 98.3 F (36.8 C), temperature source Oral, resp. rate 17, weight 111.2 kg, SpO2 (!) 87 %.  Vitals reviewed General: No acute distress; sitting up in chair at bedside wearing O2 by [place], NAD; B/L arm IVs; wearing suspenders Mood and affect are appropriate; extremely talkative. Heart: Regular rate and rhythm no rubs murmurs or extra sounds Lungs: Clear to auscultation, breathing unlabored, no rales or wheezes Abdomen: Positive bowel sounds, soft nontender to palpation, nondistended Extremities: No clubbing, cyanosis, or edema Skin: No evidence of breakdown, no evidence of rash Neurologic: Cranial nerves II through XII intact, motor strength is 5/5 in RIght and 4/5 left  deltoid, bicep, tricep, grip, hip flexor, knee extensors, ankle dorsiflexor and plantar flexor unchanged Sensory exam reduced  sensation to light touch left  upper and lower extremities Cerebellar exam dysmetria on left  Musculoskeletal: Full range of motion in all 4 extremities. No joint swelling   Assessment/Plan: 1. Functional deficits secondary to Encephalopathy which require 3+ hours per day of interdisciplinary therapy in a comprehensive inpatient rehab setting.  Physiatrist is  providing close team supervision and 24 hour management of active medical problems listed below.  Physiatrist and rehab team continue to assess barriers to discharge/monitor patient progress toward functional and medical goals  Care Tool:  Bathing  Bathing activity did not occur: Refused Body parts bathed by patient: Right arm, Left arm, Chest, Abdomen, Front perineal area, Right upper leg, Left upper leg, Face   Body parts bathed by helper: Buttocks, Right lower leg, Left lower leg     Bathing assist Assist Level: Moderate Assistance - Patient 50 - 74%     Upper Body Dressing/Undressing Upper body dressing   What is the patient wearing?: Pull over shirt    Upper body assist Assist Level: Set up assist    Lower Body Dressing/Undressing Lower body dressing      What is the patient wearing?: Underwear/pull up, Pants     Lower body assist Assist for lower body dressing: Minimal Assistance - Patient > 75%     Toileting Toileting    Toileting assist Assist for toileting: Independent with assistive device Assistive Device Comment: urinal   Transfers Chair/bed transfer  Transfers assist     Chair/bed transfer assist level: Contact Guard/Touching assist     Locomotion Ambulation   Ambulation assist      Assist level: Contact Guard/Touching assist Assistive device: Walker-rolling Max distance: 89 ft   Walk 10 feet activity   Assist     Assist level: Contact Guard/Touching assist Assistive device: Walker-rolling   Walk 50 feet activity   Assist    Assist level: Contact Guard/Touching assist Assistive device: Walker-rolling  Walk 150 feet activity   Assist Walk 150 feet activity did not occur: Safety/medical concerns         Walk 10 feet on uneven surface  activity   Assist     Assist level: Minimal Assistance - Patient > 75% Assistive device: Aeronautical engineer Will patient use wheelchair at discharge?:  Yes(goals written )      Wheelchair assist level: Moderate Assistance - Patient 50 - 74% Max wheelchair distance: 60    Wheelchair 50 feet with 2 turns activity    Assist        Assist Level: Moderate Assistance - Patient 50 - 74%   Wheelchair 150 feet activity     Assist  Wheelchair 150 feet activity did not occur: Safety/medical concerns       Blood pressure 106/83, pulse 94, temperature 98.3 F (36.8 C), temperature source Oral, resp. rate 17, weight 111.2 kg, SpO2 (!) 87 %.  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 Formulized individual treatment plan based on medical history, underlying problem and comorbidities.  2. Antithrombotics: -DVT/anticoagulation:SCD -antiplatelet therapy: N/A 3. 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 Mood:emotional support -antipsychotic agents: N/A 5. Neuropsych: This patientisgenerallycapable of making decisions on hisown behalf. 6. Skin/Wound Care:Routine skin checks 7. Fluids/Electrolytes/Nutrition:Routine in and outs with follow-up chemistries 8. Atrial fibrillation. Unsuccessful cardioversion. Continue Cardizem CD 240 mg twice daily. Follow-up cardiology services. No plan to resume anticoagulation at this time due to acute chest wall  hematoma -heart rate controlled at present 9. Acute blood loss anemia. Continue iron supplement. Follow-up CBC 10. Hyperlipidemia. Lipitor 11. Diabetes mellitus. Hemoglobin A1c 6.9. SSI/Glucophage 500 mg daily -monitor cbg's ac/hs -adjust regimen as needed  -well controlled 10/6  CBG (last 3)  Recent Labs    12/20/18 1702 12/20/18 2114 12/21/18 0653  GLUCAP 119* 136* 108*  Stable 10/07 12. CAD with CABG 2004. No chest pain or increasing shortness of breath. 13. History of CVA with left-sided residual weakness. Again no anticoagulation at this time 14. Hypothyroidism. Synthroid 15. OSA. Patient did use 2 L oxygen nightly prior to admission. 16.Hyperlipidemia.Lipitor  17.  Social lives with wife, grown children and grandchildren in home as well  120 Severe constipation-increase senna S to 2 po BID had lg BM this am per Pt    LOS: 5 days A FACE TO FACE EVALUATION WAS PERFORMED  ACharlett Blake10/08/2018, 8:40 AM

## 2018-12-21 NOTE — Progress Notes (Signed)
Physical Therapy Session Note  Patient Details  Name: [name] MRN: [id number] Date of Birth: Jun 21, 1945  Today's Date: 12/21/2018 PT Individual Time: 0805-0858 PT Individual Time Calculation (min): 53 min   Short Term Goals: Week 1:  PT Short Term Goal 1 (Week 1): STG=LTG due to estimated LOS  Skilled Therapeutic Interventions/Progress Updates: Pt presented in bed agreeable to therapy. Pt performed bed mobility with supervision and use of bed features. Pt threaded pants with set up and performed STS with CGA from slightly elevated bed. Pt made x 3 attempts from lowered bed but was unable to successfully lean forward enough and push through to complete. While in standing pt was able to maintain fair balance while clipping and adjusting suspender straps. Nsg arrived to administer meds, while assessing pt SpO2 88% on RA, provided 1L supplemental O2 during session with pt maintaining>93% for remainder of session. Pt insisting that he preferred 2 L and discussed if able to perform on 1L better to not require increased supplemental O2. Pt reluctant thus maintained at 1L. Pt transported to rehab gym for time management and transferred to mat with stand pivot transfer with RW CGA. Participated in heel cord stretching 1 min x 2 prior to ambulation as pt indicated to MD increased tightness and gastroc.  After stretching pt indicated need to possible BM. After brief therapeutic break pt ambulated x 64ft and 79ft with CGA and w/c follow. Pt returned to room and performed stand pivot to toilet. Pt left at toilet at end of session verbalizing understanding for use of call bell once completed.      Therapy Documentation Precautions:  Precautions Precautions: Fall Restrictions Weight Bearing Restrictions: No General:   Vital Signs: Therapy Vitals Temp: 97.8 F (36.6 C) Temp Source: Oral Pulse Rate: 89 Resp: 20 BP: 126/60 Patient Position (if appropriate): Lying Oxygen Therapy SpO2: (!) 82  % O2 Device: Nasal Cannula  Therapy/Group: Individual Therapy  Kimia Finan  Tobias Avitabile, PTA  12/21/2018, 3:51 PM

## 2018-12-21 NOTE — Progress Notes (Signed)
Physical Therapy Session Note  Patient Details  Name: [name] MRN: [id number] Date of Birth: 05/24/1945  Today's Date: 12/21/2018 PT Individual Time: 1000-1100 PT Individual Time Calculation (min): 60 min   Short Term Goals: Week 1:  PT Short Term Goal 1 (Week 1): STG=LTG due to estimated LOS  Skilled Therapeutic Interventions/Progress Updates:    Pt received seated in bed, agreeable to participate in therapy session. Pt reports 7/10 pain all over from gout, declines intervention. Bed mobility CGA. Pt is setup A to don shoes while seated EOB. Sit to stand with CGA to RW. Ambulation 2 x 100 ft with RW and CGA, L foot drag with gait. Pt reports decreased DF at baseline (since CVA) and reports trying several AFOs over the years with no success. SpO2 remains at 90% and above with activity on room air. Pt instructed on purse lip breathing techniques as he feels short of breath with activity. Standing alt L/R 4" step taps with RW and min A for balance, 2 x 10 reps. Nustep level 5 x 10 min with use of BLE only for global endurance training. Pt requires extended seated rest breaks during session between activities due to decreased global endurance. Pt left seated in w/c in room with needs in reach at end of session.  Therapy Documentation Precautions:  Precautions Precautions: Fall Restrictions Weight Bearing Restrictions: No    Therapy/Group: Individual Therapy   Excell Seltzer, PT, DPT  12/21/2018, 12:30 PM

## 2018-12-21 NOTE — Plan of Care (Signed)
  Problem: Consults Goal: RH GENERAL PATIENT EDUCATION Description: See Patient Education module for education specifics. Outcome: Progressing   Problem: RH BOWEL ELIMINATION Goal: RH STG MANAGE BOWEL WITH ASSISTANCE Description: STG Manage Bowel with mod I Assistance. Outcome: Progressing   Problem: RH BLADDER ELIMINATION Goal: RH STG MANAGE BLADDER WITH ASSISTANCE Description: STG Manage Bladder With min Assistance Outcome: Progressing   Problem: RH SAFETY Goal: RH STG ADHERE TO SAFETY PRECAUTIONS W/ASSISTANCE/DEVICE Description: STG Adhere to Safety Precautions With cues/reminders Assistance/Device. Outcome: Progressing   Problem: RH PAIN MANAGEMENT Goal: RH STG PAIN MANAGED AT OR BELOW PT'S PAIN GOAL Description: At or below level 5 Outcome: Progressing   Problem: RH KNOWLEDGE DEFICIT GENERAL Goal: RH STG INCREASE KNOWLEDGE OF SELF CARE AFTER HOSPITALIZATION Description: Pt and wife will be able to manage care at discharge using handouts and educational materials independently Outcome: Progressing

## 2018-12-21 NOTE — Progress Notes (Signed)
Occupational Therapy Session Note  Patient Details  Name: [name] MRN: [id number] Date of Birth: [birth year]  Today's Date: 12/21/2018 OT Individual Time: 1350-1500 OT Individual Time Calculation (min): 70 min    Short Term Goals: Week 1:  OT Short Term Goal 1 (Week 1): Pt will complete LB dressing with Mod A using AE as needed OT Short Term Goal 2 (Week 1): Pt will problem solve donning suspenders with min vcs OT Short Term Goal 3 (Week 1): Pt will complete sit<stand with supervision assist during self care tasks  Skilled Therapeutic Interventions/Progress Updates:    Treatment session with focus on dynamic standing balance and endurance during self-care tasks.  Pt received upright in w/c with wife present.  Pt agreeable to self-care tasks with bathing at sink.  Pt's wife frequently attempting to assist pt requiring cues to allow pt to attempt tasks prior to offering to assist.  Bathing completed with assistance to wash buttocks while pt maintained standing with UE support on sink.  Min cues for sequencing when donning shirt and pants, mostly due to wife attempting to assist.  Engaged in simulated shower transfer with stepping over 3" ledge.  Pt able to complete with CGA and use of RW for stability.  O2 sats maintained >90% throughout session, pt doffing during bathing with O2 sats remaining >90% on room air.  Pt remained upright in w/c with all needs in reach and wife present.  Therapy Documentation Precautions:  Precautions Precautions: Fall Restrictions Weight Bearing Restrictions: No General:   Vital Signs: Therapy Vitals Temp: 97.8 F (36.6 C) Temp Source: Oral Pulse Rate: 89 Resp: 20 BP: 126/60 Patient Position (if appropriate): Lying Oxygen Therapy SpO2: (!) 82 % O2 Device: Nasal Cannula Pain:  Pt with no c/o pain   Therapy/Group: Individual Therapy  Simonne Come 12/21/2018, 3:12 PM

## 2018-12-22 ENCOUNTER — Inpatient Hospital Stay (HOSPITAL_COMMUNITY): Payer: Medicare Other

## 2018-12-22 ENCOUNTER — Inpatient Hospital Stay (HOSPITAL_COMMUNITY): Payer: Medicare Other | Admitting: Physical Therapy

## 2018-12-22 ENCOUNTER — Inpatient Hospital Stay (HOSPITAL_COMMUNITY): Payer: Medicare Other | Admitting: Occupational Therapy

## 2018-12-22 LAB — GLUCOSE, CAPILLARY
Glucose-Capillary: 118 mg/dL — ABNORMAL HIGH (ref 70–99)
Glucose-Capillary: 98 mg/dL (ref 70–99)
Glucose-Capillary: 115 mg/dL — ABNORMAL HIGH (ref 70–99)

## 2018-12-22 NOTE — Discharge Summary (Signed)
Physician Discharge Summary  Patient ID: [name] MRN: [id number] DOB/AGE: May 11, 1945 73 y.o.  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 Reports sob, left chest pains, left lower leg swelling. EXAM: CHEST - 2 VIEW COMPARISON:  Chest radiograph 03/28/2018, 02/17/2018 FINDINGS: Stable cardiomediastinal contours status post median sternotomy. The heart size is enlarged. Chronic elevation of the right hemidiaphragm. There is a band like linear opacity at the right lung base likely representing atelectasis. The left lung is clear. No pneumothorax or pleural effusion. No acute finding in the visualized skeleton. IMPRESSION: Bandlike opacity at the right lung base likely representing atelectasis. No focal infiltrate or evidence of edema. Electronically Signed   By: Audie Pinto M.D.   On: 12/08/2018 15:40   Dg Chest 2 View  Result Date: 11/27/2018 CLINICAL DATA:  Shortness of breath. EXAM: CHEST - 2 VIEW COMPARISON:  02/17/2018 FINDINGS: Stable asymmetric elevation right hemidiaphragm. Right base chronic atelectasis/scarring is similar to prior. No pulmonary edema or focal airspace consolidation. Cardiopericardial silhouette is at upper limits of normal for size. The visualized bony structures of the thorax are intact. IMPRESSION: Stable.  No acute findings.  Electronically Signed   By: Misty Stanley M.D.   On: 11/27/2018 16:10   Ct Head Wo Contrast  Result Date: 12/14/2018 CLINICAL DATA:  Altered mental status EXAM: CT HEAD WITHOUT CONTRAST TECHNIQUE: Contiguous axial images were obtained from the base of the skull through the vertex without intravenous contrast. COMPARISON:  04/06/2017 FINDINGS: Brain: Mild atrophic and chronic white matter ischemic changes are identified. No findings to suggest acute hemorrhage, acute infarction or space-occupying mass lesion are noted. Vascular: No hyperdense vessel or unexpected calcification. Skull: Normal. Negative for fracture or focal lesion. Sinuses/Orbits: No acute finding. Other: None. IMPRESSION: Chronic atrophic and ischemic changes without acute abnormality. Electronically Signed   By: Inez Catalina M.D.   On: 12/14/2018 17:09   Dg Chest Port 1 View  Result Date: 12/14/2018 CLINICAL DATA:  Shortness of breath EXAM: PORTABLE CHEST 1 VIEW COMPARISON:  12/12/2018 FINDINGS: Previous median sternotomy and CABG as before. Cardiomegaly and atherosclerosis, unchanged. Persistent elevation of right hemidiaphragm. Scarring and/or atelectasis at the right base is unchanged. IMPRESSION: Stable elevation of right hemidiaphragm following median sternotomy with juxta diaphragmatic atelectasis and scarring. No acute finding. Electronically Signed   By: Zetta Bills M.D.   On: 12/14/2018 13:30   Dg Chest Port 1 View  Result Date: 12/12/2018 CLINICAL DATA:  Hematoma on left side of head. EXAM: PORTABLE CHEST 1 VIEW COMPARISON:  12/08/2018 FINDINGS: Previous median sternotomy and CABG. Cardiac enlargement. Asymmetric elevation of the right hemidiaphragm, unchanged. Scarring identified within the right base, similar to previous exam. No superimposed pleural effusion, airspace consolidation or atelectasis. IMPRESSION: 1. No acute cardiopulmonary abnormalities. 2. Cardiac enlargement. Electronically Signed   By: Kerby Moors M.D.    On: 12/12/2018 20:06   Vas [place] Lower Extremity Venous (dvt) (only Mc & Wl 7a-7p)  Result Date: 11/28/2018  Lower Venous Study  Indications: Swelling, SOB, and Patient is on Coumadin.  Risk Factors: Rapid atrial fibrillation. Comparison Study: No prior study on file Performing Technologist: Sharion Dove RVS  Examination Guidelines: A complete evaluation includes B-mode imaging, spectral Doppler, color Doppler, and power Doppler as needed of all accessible portions of each vessel. Bilateral testing is considered an integral part of a complete examination. Limited examinations for reoccurring indications may be performed as noted.  +-----+---------------+---------+-----------+----------+-------------------+ RIGHTCompressibilityPhasicitySpontaneityPropertiesThrombus Aging      +-----+---------------+---------+-----------+----------+-------------------+ CFV  Full                                         pulsatile waveforms +-----+---------------+---------+-----------+----------+-------------------+   +---------+---------------+---------+-----------+----------+-------------------+ LEFT     CompressibilityPhasicitySpontaneityPropertiesThrombus Aging      +---------+---------------+---------+-----------+----------+-------------------+ CFV      Full                                         pulsatile waveforms +---------+---------------+---------+-----------+----------+-------------------+ SFJ      Full                                                             +---------+---------------+---------+-----------+----------+-------------------+ FV Prox  Full                                                             +---------+---------------+---------+-----------+----------+-------------------+ FV Mid   Full                                                             +---------+---------------+---------+-----------+----------+-------------------+ FV DistalFull                                                              +---------+---------------+---------+-----------+----------+-------------------+ PFV      Full                                                             +---------+---------------+---------+-----------+----------+-------------------+ POP      Full                                         pulsatile waveforms +---------+---------------+---------+-----------+----------+-------------------+ PTV      Full                                                             +---------+---------------+---------+-----------+----------+-------------------+  PERO     Full                                                             +---------+---------------+---------+-----------+----------+-------------------+     Summary: Right: No evidence of common femoral vein obstruction. Left: There is no evidence of deep vein thrombosis in the lower extremity.  *See table(s) above for measurements and observations. Electronically signed by Servando Snare MD on 11/28/2018 at 37:37:04 AM.    Final     Labs:  Basic Metabolic Panel: Recent Labs  Lab 12/17/18 0632 12/23/18 1301  NA 136 137  K 3.8 4.5  CL 97* 100  CO2 30 28  GLUCOSE 117* 128*  BUN 21 15  CREATININE 0.96 1.06  CALCIUM 8.5* 9.1  MG  --  2.4    CBC: Recent Labs  Lab 12/17/18 0632 12/23/18 1301  WBC 8.9 6.4  NEUTROABS 6.1  --   HGB 9.2* 10.6*  HCT 27.9* 34.8*  MCV 91.5 98.0  PLT 204 284    CBG: Recent Labs  Lab 12/22/18 0620 12/22/18 1226 12/22/18 1734 12/23/18 0612 12/23/18 1739  GLUCAP 118* 98 115* 99 104*   Family history.  Father with lung cancer, mother with CVA.  Denies diabetes mellitus or colon cancer  Brief HPI:   [name] is a 73 y.o. right-handed male with history of congestive heart failure, hypertension, CAD with CABG 2004, CVA with left side residual weakness, diabetes mellitus, obstructive sleep apnea, atrial fibrillation with chronic Coumadin.   Per chart review lives with spouse 1 level home.  Reportedly independent prior to admission.  Presented 12/09/2018 with recurrent atrial fibrillation shortness of breath altered mental status and attempts made cardioversion unsuccessful placed on intravenous Cardizem drip.  INR on admission of 2.0.  Echocardiogram did not reveal any ischemic changes.  Patient remained on IV heparin as well as Coumadin.  Rapid response 12/12/2018 for mild disorientation and dysarthria as well as large hematoma left lateral chest.  No trauma noted.  Suspect spontaneous chest wall hematoma per cardiology services and heparin and Coumadin discontinued.  IMA globin dipped to 6.8 he was transfused 2 units of packed red blood cells.  Cranial CT scan 12/14/2018 due to altered mental status showed no acute change as well as ABGs unremarkable.  Patient would remain off anticoagulation at this time consideration for a watchman device and anticoagulation could be readdressed as an outpatient.  Patient was admitted for a comprehensive rehab program   Hospital Course: [name] was admitted to rehab 12/16/2018 for inpatient therapies to consist of PT, ST and OT at least three hours five days a week. Past admission physiatrist, therapy team and rehab RN have worked together to provide customized collaborative inpatient rehab.  Pertaining to patient acute encephalopathy secondary to multifactorial atrial fibrillation spontaneous acute chest wall hematoma secondary to anticoagulation.  Chronic left hemiparesis from remote right basal ganglia infarction.  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  Patient would follow neurology service as well as cardiology services.  SCDs for DVT prophylaxis.  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  Noted cramping left calf started  on tizanadine.  Noted history of atrial fibrillation unsuccessful cardioversion Cardizem as advised he would follow-up outpatient cardiology services on awaiting plan to resume anticoagulation which had been held due to acute chest wall hematoma.  Cardiac rate remained controlled.  Acute blood loss anemia stable no other bleeding episodes.  Lipitor ongoing for hyperlipidemia.  Blood sugars overall controlled hemoglobin A1c 6.9 Glucophage as directed with diabetic teaching.  Noted history of CAD with CABG 2004 no chest pain or shortness of breath as well as history of CVA with noted left-sided residual weakness again he would remain off anticoagulation.   Blood pressures were monitored on TID basis and monitored  Diabetes has been monitored with ac/hs CBG checks and SSI was use prn for tighter BS control.   He/ is continent of bowel and bladder.  He/ has made gains during rehab stay and is attending therapies  He/She will continue to receive follow up therapies   after discharge  Rehab course: During patient's stay in rehab weekly team conferences were held to monitor patient's progress, set goals and discuss barriers to discharge. At admission, patient required moderate assist ambulate 4 feet rolling walker, moderate assist sit to stand, max assist sit to supine and moderate assist supine to sit.  Set up for grooming, set up for upper body bathing, moderate assist lower body bathing, set up upper body dressing, moderate assist lower body dressing, moderate assist toilet transfers  Physical exam.  Blood pressure 124/83 pulse 95 temperature 98.4 respirations 20 oxygen saturation 98% room air Constitutional.  No distress HEENT.  Normocephalic and atraumatic Eyes.  Pupils round reactive to light no discharge without nystagmus Neck.  Supple nontender normal range of motion no thyromegaly without JVD Cardiovascular irregular irregular murmur heard Respiratory.  Effort normal breath sounds normal no  respiratory distress no wheezes no rails GI.  Soft exhibits no distention nontender positive bowel sounds Musculoskeletal general edema present 1+ Neurological oriented to person place provide biographical information some delay in processing easily distracted.  Upper extremities 4 out of 5 lower extremities 3- out of 5 hip flexors, 3 out of 5 knee extension and 4 out of 5 ankle dorsi plantarflexion.  Sensation intact  He/  has had improvement in activity tolerance, balance, postural control as well as ability to compensate for deficits. He/ has had improvement in functional use RUE/LUE  and RLE/LLE as well as improvement in awareness.  Working with energy conservation techniques.  Ambulates 50 feet contact-guard rolling walker.  Completed lateral sidestepping x2 bilaterally with right upper extremity support on the hallway railing for stability.  He was able to increase his ambulation up to 132 feet with contact-guard rolling walker from hallway to the day room.  Completed NuStep bike 10 minutes total.  Oxygen saturations remain 90s when checked.  Patient perform supine to sit with supervision to edge of bed.  He could don his shoes independently and utilize rolling walker to transfer into wheelchair.  He ambulated outside on uneven surfaces with rolling walker.  Full family teaching completed plan discharge to home       Disposition: Discharge disposition: 01-Home or Self Care     Discharge to home   Diet: Diabetic diet  Special Instructions: No smoking driving or alcohol  No Coumadin or anticoagulation till follow-up cardiology services  Continue oxygen as prior to admission  Medications at discharge. 1.  Tylenol as needed 2.  Zyloprim 100 mg p.o. twice daily 3.  Lipitor 40 mg p.o. daily 4.  Cardizem CD 240 mg p.o. twice daily 5.  Ferrous sulfate 325 mg p.o. twice daily 6.  Folic acid 1 mg p.o. daily 7.  Neurontin 100 mg p.o. nightly 8.  Synthroid 112 mcg p.o. daily 9.   Glucophage 500 mg p.o. daily 10.  Zanaflex 2 mg p.o. nightly 11.  Ultram 50 mg p.o. twice daily 12.  Ultram ER 200 mg every morning 13. cyancobalmin 1000 mcg daily  Discharge Instructions    Ambulatory referral to Physical Medicine Rehab   Complete by: As directed    Moderate complexity follow-up 1 to 2 weeks acute encephalopathy    13.  Lasix 40 mg daily as needed edema  Follow-up Information    Kirsteins, Luanna Salk, MD Follow up.   Specialty: Physical Medicine and Rehabilitation Why: As directed Contact information: [place] [id number] [phone]        Jerline Pain, MD Follow up.   Specialty: Cardiology Why: Call for appointment Contact information: [id number] [place] [address] [phone]           Signed: Lavon Paganini [place] 12/24/2018, 5:20 AM

## 2018-12-22 NOTE — Progress Notes (Signed)
Physical Therapy Session Note  Patient Details  Name: Draylon Febres MRN: [id number] Date of Birth: 1945-08-09  Today's Date: 12/22/2018 PT Individual Time: 0800-0830 PT Individual Time Calculation (min): 30 min   Short Term Goals: Week 1:  PT Short Term Goal 1 (Week 1): STG=LTG due to estimated LOS      Skilled Therapeutic Interventions/Progress Updates:   Pt seated on toilet. Pt performed peri care in standing for large BM.  PT used wet washcloth for thoroughness.  Clothing mgt with min assist.  Max assist for managing suspenders, in standing.   Pt c/o of feeling "washed out" due to BM, but wanted to leave room.  neuromuscular re-education in standing with RUE support via demo and multimodal cues: bil heel raises, bil mini squats.  At end of session, pt seated in w/c with needs at hand.      Therapy Documentation Precautions:  Precautions Precautions: Fall Restrictions Weight Bearing Restrictions: No   Pain: pt denied         Therapy/Group: Individual Therapy  Cuauhtemoc Huegel 12/22/2018, 9:46 AM

## 2018-12-22 NOTE — Progress Notes (Signed)
Physical Therapy Session Note  Patient Details  Name: [name] MRN: [id number] Date of Birth: 1945/12/09  Today's Date: 12/22/2018 PT Individual Time: 1635-1730 PT Individual Time Calculation (min): 55 min   Short Term Goals: Week 1:  PT Short Term Goal 1 (Week 1): STG=LTG due to estimated LOS  Skilled Therapeutic Interventions/Progress Updates: Pt upright in wheelchair upon arrival for therapy. Pt agreeable for treatment. Pt transported to therapy gym. Pt ambulated 50 feet with CGA and RW. Pt completed lateral side steppingx2 bilaterally with R UE support on the hallway railing for stability. Pt required verbal cueing for proper form of task and upright trunk posture. Then pt ambulated 132 feet with CGA and RW from hallway>day room. Pt required a rest break d/t fatigue. Pt then ambulated 120 feet with CGA and RW. Pt completed NuStep bike under the following parameters: 10 minutes total, Workload: 3, RPE: 13. Pt completed sit to stand from NuStep and completed 1x8 mini squats with CGA and RW. Pt stated "I'm lightheaded, and have to sit down." Pt transferred to wheelchair and transported back to his room. Pt left sitting upright in chair, call bell within reach. All needs met at this time.  Vital signs monitored throughout session. Pt's O2 sats remained in the 90s when checked periodically after various activities in the session. SPT noticed that pt's O2 sats get into the low 90s when he is talking and trying to complete activities in therapy session requiring therapist cues to take deep breaths.     Therapy Documentation Precautions:  Precautions Precautions: Fall Restrictions Weight Bearing Restrictions: No  Pain: denies pain    Therapy/Group: Individual Therapy  Olena Leatherwood, SPT  12/22/2018, 12:50 PM

## 2018-12-22 NOTE — Progress Notes (Signed)
[facility] PHYSICAL MEDICINE & REHABILITATION PROGRESS NOTE   Subjective/Complaints:  No issues overnight, patient feels like he is ready to go home.  I discussed this with his physical therapist.  They think he should be ready by this weekend.    ROS- denies CP, SOB (wears O2 at home only at noc , now continuous) Objective:   No results found. No results for input(s): WBC, HGB, HCT, PLT in the last 72 hours. No results for input(s): NA, K, CL, CO2, GLUCOSE, BUN, CREATININE, CALCIUM in the last 72 hours.  Intake/Output Summary (Last 24 hours) at 12/22/2018 1041 Last data filed at 12/22/2018 0847 Gross per 24 hour  Intake 1004 ml  Output 201 ml  Net 803 ml     Physical Exam: Vital Signs Blood pressure 107/71, pulse 85, temperature 98.2 F (36.8 C), resp. rate 18, weight 111.2 kg, SpO2 99 %.  Vitals reviewed General: No acute distress; sitting up in chair at bedside wearing O2 by [place], NAD; B/L arm IVs; wearing suspenders Mood and affect are appropriate; extremely talkative. Heart: Regular rate and rhythm no rubs murmurs or extra sounds Lungs: Clear to auscultation, breathing unlabored, no rales or wheezes Abdomen: Positive bowel sounds, soft nontender to palpation, nondistended Extremities: No clubbing, cyanosis, or edema Skin: No evidence of breakdown, no evidence of rash Neurologic: Cranial nerves II through XII intact, motor strength is 5/5 in RIght and 4/5 left  deltoid, bicep, tricep, grip, hip flexor, knee extensors, ankle dorsiflexor and plantar flexor unchanged Sensory exam reduced  sensation to light touch left  upper and lower extremities Cerebellar exam dysmetria on left  Musculoskeletal: Full range of motion in all 4 extremities. No joint swelling   Assessment/Plan: 1. Functional deficits secondary to Encephalopathy which require 3+ hours per day of interdisciplinary therapy in a comprehensive inpatient rehab setting.  Physiatrist is providing close team  supervision and 24 hour management of active medical problems listed below.  Physiatrist and rehab team continue to assess barriers to discharge/monitor patient progress toward functional and medical goals  Care Tool:  Bathing  Bathing activity did not occur: Refused Body parts bathed by patient: Right arm, Left arm, Chest, Abdomen, Front perineal area, Right upper leg, Left upper leg, Face   Body parts bathed by helper: Buttocks, Right lower leg, Left lower leg     Bathing assist Assist Level: Minimal Assistance - Patient > 75%     Upper Body Dressing/Undressing Upper body dressing   What is the patient wearing?: Pull over shirt    Upper body assist Assist Level: Set up assist    Lower Body Dressing/Undressing Lower body dressing      What is the patient wearing?: Underwear/pull up, Pants     Lower body assist Assist for lower body dressing: Contact Guard/Touching assist     Toileting Toileting    Toileting assist Assist for toileting: Independent with assistive device Assistive Device Comment: urinal   Transfers Chair/bed transfer  Transfers assist     Chair/bed transfer assist level: Contact Guard/Touching assist     Locomotion Ambulation   Ambulation assist      Assist level: Contact Guard/Touching assist Assistive device: Walker-rolling Max distance: 100'   Walk 10 feet activity   Assist     Assist level: Contact Guard/Touching assist Assistive device: Walker-rolling   Walk 50 feet activity   Assist    Assist level: Contact Guard/Touching assist Assistive device: Walker-rolling    Walk 150 feet activity   Assist Walk 150  feet activity did not occur: Safety/medical concerns         Walk 10 feet on uneven surface  activity   Assist     Assist level: Minimal Assistance - Patient > 75% Assistive device: Aeronautical engineer Will patient use wheelchair at discharge?: Yes(goals written )       Wheelchair assist level: Moderate Assistance - Patient 50 - 74% Max wheelchair distance: 60    Wheelchair 50 feet with 2 turns activity    Assist        Assist Level: Moderate Assistance - Patient 50 - 74%   Wheelchair 150 feet activity     Assist  Wheelchair 150 feet activity did not occur: Safety/medical concerns       Blood pressure 107/71, pulse 85, temperature 98.2 F (36.8 C), resp. rate 18, weight 111.2 kg, SpO2 99 %.  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 Formulized individual treatment plan based on medical history, underlying problem and comorbidities.  2. Antithrombotics: -DVT/anticoagulation:SCD -antiplatelet therapy: N/A 3. 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 Mood:emotional support -antipsychotic agents: N/A 5. Neuropsych: This patientisgenerallycapable of making decisions on hisown behalf. 6. Skin/Wound Care:Routine skin checks 7. Fluids/Electrolytes/Nutrition:Routine in and outs with follow-up chemistries 8. Atrial fibrillation. Unsuccessful cardioversion. Continue Cardizem CD 240 mg twice daily. Follow-up cardiology services. No plan to resume anticoagulation at this time due to acute chest wall hematoma -heart rate controlled at present 9.  Acute blood loss anemia. Continue iron supplement. Follow-up CBC 10. Hyperlipidemia. Lipitor 11. Diabetes mellitus. Hemoglobin A1c 6.9. SSI/Glucophage 500 mg daily -monitor cbg's ac/hs -adjust regimen as needed  -well controlled 10/6  CBG (last 3)  Recent Labs    12/21/18 1634 12/21/18 2104 12/22/18 0620  GLUCAP 98 122* 118*  Stable 10/07 12. CAD with CABG 2004. No chest pain or increasing shortness of breath. 13. History of CVA with left-sided residual weakness. Again no anticoagulation at this time 14. Hypothyroidism. Synthroid 15. OSA. Patient did use 2 L oxygen nightly prior to admission. 16.Hyperlipidemia.Lipitor  17.  Social lives with wife, grown children and grandchildren in home as well  179 Severe constipation-increase senna S to 2 po BID had lg BM this am per Pt    LOS: 6 days A FACE TO FACE EVALUATION WAS PERFORMED  ACharlett Blake10/09/2018, 10:41 AM

## 2018-12-22 NOTE — Progress Notes (Signed)
Physical Therapy Session Note  Patient Details  Name: [name] MRN: [id number] Date of Birth: [birth year]  Today's Date: 12/22/2018 PT Individual Time: 0920-1035 PT Individual Time Calculation (min): 75 min   Short Term Goals: Week 1:  PT Short Term Goal 1 (Week 1): STG=LTG due to estimated LOS  Skilled Therapeutic Interventions/Progress Updates: Pt presented in w/c agreeable to therapy. Pt transported to rehab gym for energy conservation. Participated in ascending/descending 4 steps x 2 first with x 2 rails and then with single L rail. SpO2 after stairs >90% on RA. Pt ambulated 62f supervision with RW and participated in [place]. Pt then ambulated to elevators 619fnd then to dayroom 11068fith supervision.  After ambulation SpO2 85%, which resolved within 30sec with cues for PLB. Discussed with pt energy conservation and breathing with activity. Participated in NuStep L4 BLE for general conditioning. SpO2 100% on RA. Pt transferred to w/c and propelled with BLE [id number] PTA transporting remaining distance. Once back in room pt performed ambulatory transfer to bed and returned to supine with bed flat and use of bed rail only mod I. Pt repositioned to comfort and left with call bell within reach, bed alarm on and current needs met.      Therapy Documentation Precautions:  Precautions Precautions: Fall Restrictions Weight Bearing Restrictions: No General:   Vital Signs:   Pain:   Mobility:   Locomotion :    Trunk/Postural Assessment :    Balance:   Exercises:   Other Treatments:      Therapy/Group: Individual Therapy  Pamella Samons 12/22/2018, 12:42 PM

## 2018-12-22 NOTE — Progress Notes (Signed)
Occupational Therapy Session Note  Patient Details  Name: [name] MRN: [id number] Date of Birth: 1945-11-27  Today's Date: 12/22/2018 OT Individual Time: 1400-1500 OT Individual Time Calculation (min): 60 min    Short Term Goals: Week 1:  OT Short Term Goal 1 (Week 1): Pt will complete LB dressing with Mod A using AE as needed OT Short Term Goal 2 (Week 1): Pt will problem solve donning suspenders with min vcs OT Short Term Goal 3 (Week 1): Pt will complete sit<stand with supervision assist during self care tasks  Skilled Therapeutic Interventions/Progress Updates:    Upon entering the room, pt supine in bed with no c/o pain and agreeable to OT intervention. Pt performed supine >sit with supervision to EOB. Pt donning shoes independently and utilized RW to transfer into wheelchair. Pt declined toileting needs as well as bathing and dressing this session. OT assisted pt outside via wheelchair for energy conservation. Pt ambulating outside on uneven surfaces with RW 100' with close supervision for safety. Pt taking seated rest break secondary to fatigue. Pt remained on RA this session with no c/o SOB. Pt discussed home set up with therapist and it appears that no further equipment is recommended for discharge. OT assisted pt back to room at end of session. He remained in wheelchair with chair alarm activated and wife present. Call bell and all needed items within reach.   Therapy Documentation Precautions:  Precautions Precautions: Fall Restrictions Weight Bearing Restrictions: No General:   Vital Signs: Therapy Vitals Temp: 98.7 F (37.1 C) Temp Source: Oral Pulse Rate: 73 Resp: 19 BP: 124/66 Patient Position (if appropriate): Lying Oxygen Therapy SpO2: 98 % O2 Device: Room Air Pain:   ADL: ADL Eating: Not assessed Grooming: Not assessed Upper Body Bathing: Supervision/safety Where Assessed-Upper Body Bathing: Edge of bed Lower Body Bathing: Moderate  assistance Where Assessed-Lower Body Bathing: Edge of bed Upper Body Dressing: Setup Where Assessed-Upper Body Dressing: Edge of bed Lower Body Dressing: Maximal assistance Where Assessed-Lower Body Dressing: Edge of bed Toileting: Not assessed Toilet Transfer: Contact guard Toilet Transfer Method: Ambulating(RW) Gaffer Transfer: Not assessed   Therapy/Group: Individual Therapy  Gypsy Decant 12/22/2018, 4:54 PM

## 2018-12-22 NOTE — Patient Care Conference (Signed)
Inpatient RehabilitationTeam Conference and Plan of Care Update Date: 12/22/2018   Time: 10:45 AM    Patient Name: [name]      Medical Record Number: [id number]  Date of Birth: 02-Mar-1946 Sex: Male         Room/Bed: 4W24C/4W24C-01 Payor Info: Payor: MEDICARE / Plan: MEDICARE PART A AND B / Product Type: *No Product type* /    Admit Date/Time:  12/16/2018  4:39 PM  Primary Diagnosis:  Acute encephalopathy  Patient Active Problem List   Diagnosis Date Noted  . Chest wall hematoma 12/16/2018  . Delirium 12/16/2018  . Blood loss anemia 12/16/2018  . Dysuria 12/16/2018  . Acute encephalopathy 12/16/2018  . Acute on chronic diastolic heart failure ([place])   . Anticoagulated on warfarin   . Elevated troponin   . Congestive heart failure ([place])   . Abnormal INR   . AKI (acute kidney injury) ([place])   . History of gout   . History of hypothyroidism   . History of type 2 diabetes mellitus   . Atrial fibrillation with rapid ventricular response ([place]) 11/27/2018  . Ischemic chest pain ([place]) 02/17/2018  . Pure hypercholesterolemia 05/01/2017  . Thoracic aorta atherosclerosis ([place]) 04/27/2017  . Stroke (cerebrum) ([place]) 01/13/2017  . RUQ abdominal pain 01/13/2017  . Paroxysmal atrial fibrillation ([place]) 12/06/2016  . Dyspnea on exertion 10/21/2016  . Acute respiratory failure with hypoxia and hypercapnia ([place]) 12/12/2015  . Coronary 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 ([place]) 11/04/2015  . CAD S/P CFX DES 2011 04/25/2015  . Persistent atrial fibrillation 04/25/2015  . Vasovagal syncope   . Constipation 04/24/2015  . Chronic diastolic heart failure ([place]) 04/24/2015  . B12 deficiency 08/11/2013  . Sensory disturbance 08/11/2013  . Ataxia 08/10/2013  . Laceration of ear, external, right 07/05/2013  . Insomnia 05/21/2013  . Hypothyroidism, acquired 02/15/2013  . Weakness 11/17/2012  . Spastic hemiplegia affecting dominant side ([place]) 07/21/2011  . Long term  current use of anticoagulant therapy 06/24/2011  . Hypogonadism male 04/10/2011  . Fatigue 03/31/2011  . Allergic rhinitis 02/13/2010  . BENIGN POSITIONAL VERTIGO 11/23/2009  . 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 Hypothyroidism 04/02/2009  . Back pain 01/29/2009  . Type 2 diabetes mellitus without complication, without long-term current use of insulin ([place]) 10/05/2007  . Obstructive sleep apnea-Failed CPAP 06/17/2007  . Hyperlipidemia LDL goal <70 12/12/2006  . GOUT 12/12/2006  . Essential hypertension 12/12/2006  . History of stroke with residual deficit 12/12/2006  . GERD 12/12/2006    Expected Discharge Date: Expected Discharge Date: 12/24/18  Team Members Present: Physician leading conference: Dr. Courtney Heys Social Worker Present: Ovidio Kin, LCSW Nurse Present: Isla Pence, RN PT Present: Barrie Folk, PT;Rosita Dechalus, PTA OT Present: Darleen Crocker, OT PPS Coordinator present : Gunnar Fusi, SLP     Current Status/Progress Goal Weekly Team Focus  Bowel/Bladder   Patient is continent of B/B, LBM 106/20 prn laxative provied due to constipation, continue schedule stool softener  Maintain continence  QS/PRN address toilwting need and constipation   Swallow/Nutrition/ Hydration             ADL's   Steady assist ambulatory toilet trasnfers using RW, setup UB self care, Min-Mod A LB self care + toileting  Supervision-Min A overall  ADL retraining, activity tolerance, balance, functional transfers, 02 weaning   Mobility   Supervision assist bed mobility. CGA transfers and gait with RW for safety.  Supervision assist overall with LRAD at ambulatory  level  improved endurance. safety. balance. d/c planning. family education.   Communication             Safety/Cognition/ Behavioral Observations            Pain   Patient verbalize pain lower extemities and back pain  on schedule Tramadol Q6 hrs and prn Tylenol Q 4 hrs pain scoring  rating 6-7/10 prior to medication  < = ,3   Assess pain QS/PRN with f/u   Skin   Patient has large ecchymotic/ abrasion areas to abdomen, arms, Anterior, Posterior regions of body  Maintain skin integrity, no skin breakage  Assess QS/PRN any clinical changes      *See Care Plan and progress notes for long and short-term goals.     Barriers to Discharge  Current Status/Progress Possible Resolutions Date Resolved   Nursing                  PT                    OT                  SLP                SW                Discharge Planning/Teaching Needs:  HOme with wife who can assist at DC. Will need to have her come in for education prior to DC      Team Discussion:  Goals supervision-min level. Very close to achieving this level. O2 100% during the day but still wears O2 at times, feels better. Wears at night at home. Granddaughter to check BS 2x day pt and wife refuse to do this. Endurance and safety issues.  Revisions to Treatment Plan:  DC 10/9    Medical Summary Current Status: Chronic left hemiplegia, hypoxemia doing well on continuous O2 Weekly Focus/Goal: Improve endurance improved balance  Barriers to Discharge: Medical stability;Other (comments)  Barriers to Discharge Comments: Chronic left hemiplegia Possible Resolutions to Barriers: Continue rehab with transition to home care   Continued Need for Acute Rehabilitation Level of Care: The patient requires daily medical management by a physician with specialized training in physical medicine and rehabilitation for the following reasons: Direction of a multidisciplinary physical rehabilitation program to maximize functional independence : Yes Medical management of patient stability for increased activity during participation in an intensive rehabilitation regime.: Yes Analysis of laboratory values and/or radiology reports with any subsequent need for medication adjustment and/or medical intervention. : Yes   I attest that I was present, lead the team  conference, and concur with the assessment and plan of the team. Teleconference held due to COVID 19   Francisca Langenderfer, Gardiner Rhyme 12/22/2018, 1:57 PM

## 2018-12-23 ENCOUNTER — Inpatient Hospital Stay (HOSPITAL_COMMUNITY): Payer: Medicare Other | Admitting: Occupational Therapy

## 2018-12-23 ENCOUNTER — Inpatient Hospital Stay (HOSPITAL_COMMUNITY): Payer: Medicare Other | Admitting: Physical Therapy

## 2018-12-23 ENCOUNTER — Inpatient Hospital Stay (HOSPITAL_COMMUNITY): Payer: Medicare Other

## 2018-12-23 DIAGNOSIS — R0789 Other chest pain: Secondary | ICD-10-CM

## 2018-12-23 LAB — CBC
HCT: 34.8 % — ABNORMAL LOW (ref 39.0–52.0)
Hemoglobin: 10.6 g/dL — ABNORMAL LOW (ref 13.0–17.0)
MCH: 29.9 pg (ref 26.0–34.0)
MCHC: 30.5 g/dL (ref 30.0–36.0)
MCV: 98 fL (ref 80.0–100.0)
Platelets: 284 10*3/uL (ref 150–400)
RBC: 3.55 MIL/uL — ABNORMAL LOW (ref 4.22–5.81)
RDW: 18.2 % — ABNORMAL HIGH (ref 11.5–15.5)
WBC: 6.4 10*3/uL (ref 4.0–10.5)
nRBC: 0 % (ref 0.0–0.2)

## 2018-12-23 LAB — BASIC METABOLIC PANEL
Anion gap: 9 (ref 5–15)
BUN: 15 mg/dL (ref 8–23)
CO2: 28 mmol/L (ref 22–32)
Calcium: 9.1 mg/dL (ref 8.9–10.3)
Chloride: 100 mmol/L (ref 98–111)
Creatinine, Ser: 1.06 mg/dL (ref 0.61–1.24)
GFR calc Af Amer: >60 mL/min (ref >60)
GFR calc non Af Amer: >60 mL/min (ref >60)
Glucose, Bld: 128 mg/dL — ABNORMAL HIGH (ref 70–99)
Potassium: 4.5 mmol/L (ref 3.5–5.1)
Sodium: 137 mmol/L (ref 135–145)

## 2018-12-23 LAB — MAGNESIUM: Magnesium: 2.4 mg/dL (ref 1.7–2.4)

## 2018-12-23 LAB — GLUCOSE, CAPILLARY
Glucose-Capillary: 99 mg/dL (ref 70–99)
Glucose-Capillary: 104 mg/dL — ABNORMAL HIGH (ref 70–99)

## 2018-12-23 LAB — TROPONIN I (HIGH SENSITIVITY)
Troponin I (High Sensitivity): 10 ng/L (ref <18)
Troponin I (High Sensitivity): 11 ng/L (ref <18)

## 2018-12-23 LAB — BRAIN NATRIURETIC PEPTIDE: B Natriuretic Peptide: 361.3 pg/mL — ABNORMAL HIGH (ref 0.0–100.0)

## 2018-12-23 MED ORDER — TIZANIDINE HCL 2 MG PO TABS: 2.0000 mg | ORAL_TABLET | Freq: Every day | ORAL | 0 refills | Status: DC

## 2018-12-23 MED ORDER — FERROUS SULFATE 325 (65 FE) MG PO TABS: 325.0000 mg | ORAL_TABLET | Freq: Two times a day (BID) | ORAL | 3 refills | Status: DC

## 2018-12-23 MED ORDER — LEVOTHYROXINE SODIUM 112 MCG PO TABS: 112.0000 ug | ORAL_TABLET | Freq: Every day | ORAL | 1 refills | Status: DC

## 2018-12-23 MED ORDER — CALCIUM CARBONATE ANTACID 500 MG PO CHEW
1.0000 | CHEWABLE_TABLET | Freq: Once | ORAL | Status: AC
  Administered 2018-12-23: 200 mg via ORAL
  Filled 2018-12-23: qty 1

## 2018-12-23 MED ORDER — DILTIAZEM HCL ER COATED BEADS 240 MG PO CP24: 240.0000 mg | ORAL_CAPSULE | Freq: Two times a day (BID) | ORAL | 1 refills | Status: DC

## 2018-12-23 MED ORDER — ALLOPURINOL 100 MG PO TABS: 100.0000 mg | ORAL_TABLET | Freq: Two times a day (BID) | ORAL | 0 refills | Status: DC

## 2018-12-23 MED ORDER — ACETAMINOPHEN 325 MG PO TABS: 650.0000 mg | ORAL_TABLET | ORAL | Status: DC | PRN

## 2018-12-23 MED ORDER — FOLIC ACID 1 MG PO TABS: 1.0000 mg | ORAL_TABLET | Freq: Every day | ORAL | 0 refills | Status: DC

## 2018-12-23 MED ORDER — METFORMIN HCL 500 MG PO TABS: 500.0000 mg | ORAL_TABLET | Freq: Every day | ORAL | 1 refills | Status: DC

## 2018-12-23 MED ORDER — ATORVASTATIN CALCIUM 40 MG PO TABS: 40.0000 mg | ORAL_TABLET | Freq: Every day | ORAL | 3 refills | Status: DC

## 2018-12-23 MED ORDER — PANTOPRAZOLE SODIUM 40 MG PO TBEC: 40.0000 mg | DELAYED_RELEASE_TABLET | Freq: Every morning | ORAL | 1 refills | Status: DC

## 2018-12-23 MED ORDER — GABAPENTIN 100 MG PO CAPS: 100.0000 mg | ORAL_CAPSULE | Freq: Every day | ORAL | 1 refills | Status: DC

## 2018-12-23 NOTE — Consult Note (Addendum)
Cardiology Consultation:   Patient ID: [name] MRN: [id number]; DOB: 03-13-1946  Admit date: 12/16/2018 Date of Consult: 12/23/2018  Primary Care Provider: Debbrah Alar, NP Primary Cardiologist: [name] Furbish, MD  Primary Electrophysiologist:  None   Patient Profile:   [name] is a 73 y.o. male with a hx of CAD s/p CABG in 2004, DES to LCx in 2011, paroxysmal afib previously on coumadin, hypertension, hyperlipidemia, diabetes, diastolic heart failure, previous CVA with residual left sided defecit and OSA on nighttime O2, and recent admission for afib rvr/acute heart failure complicated by spontaneous chest hematoma, discharged to inpatient rehab who is being seen today for the evaluation of chest pain at the request of [name].  History of Present Illness:   [name] follows with Dr. Marlou [name] for the above cardiac problems. He has a history of CAD s/p CABG 2004, prior CVA, and paroxysmal afib. He had a cath in 2011 showing occlusion of SVG-RCA and SVG-OM and underwent placement of native CFX DES. In 2013 he had a myoview that was negative for ischemia. Echo at that time showed normal EF with no diastolic dysfunction. He underwent DCCV in 2013 and was maintaining NSR, on chronic coumadin. In 2017 patient was admitted with afib RVR and successfully cardioverted. EF at that time was 50-55%, no wall motion abnormalities, normal diastolic dysfunction. He had successful cardioversion with amiodarone in 2017 and continued PO amio. BB was discontinued for bradycardia and dizziness. In 10/2015 patient was hospitalized for chest pain and underwent cath showing severe native CAD, patent stent, and patent grafts. No intervention was done, and medical management was continued. In 2018 patient was seen in the ED for shortness of breath an it was decided that amiodarone may be the etiology and it was stopped. Patient underwent myoview stress test in 2019 that was low risk with small  fixed inferolateral defect. Imdur was increased. In 02/2018 patient was admitted for dyspnea felt to be anginal equivilint and he underwent cath that was unchanged from prior. It showed severe 3 V CAD and the SVT to LCx was occluded, however the LIMA to LAD, SVG to D, and SVG to OM were all patent. BB was once again tried, but stopped due to bradycardia.   11/27/18 - 11/29/18 patient was admitted for afib RVR. Cardioversion was unsuccessful and was placed on IV Cardizem. Discharged on PO Cardizem.   On 12/08/2018 patient was readmitted for fatigue found to be in afib RVR and acute heart failure. Patient was diuresed. Cardioversion was succesful but patient converted back afib later that day. EP saw the patient and started him on amiodarone after checking chest CT. Course was complicated by a chest hematoma and he was transferred to the ICU. Anticoagulation was held. ACE and Imdur were held for hypotension in the setting of acute bleed. Amio was also discontinued. He was on coumadin heparin bridge at that time and both were reversed. Cardizem was increased at discharged and lasix PRN as sent. Patient was discharged to inpatient rehab 12/16/18. At discharge patient was still in afib. Patient has since been in rehab and plan was for discharge tomorrow. Since learning of his discharge the patient began complaining of 7/10 centralized chest pain that is sharp in nature. He says he has been experiencing this daily for the last 3 weeks. Pain occurs at rest. He has no sypmptmos during exercises. He is unsure if it radiates because he cannot feel the left side of his body. Pain can last for  a couple minutes and then subsides. No shortness of breath. Denies N/V and diaphoresis. Initial Hs troponin was 10. EKG showed Afib, 85 bpm and no ischemic changes.  Patient has chronic left leg swelling since his stroke. Cardiology was consulted.   Heart Pathway Score:     Past Medical History:  Diagnosis Date   Acute  encephalopathy 12/12/2015   Acute respiratory failure with hypoxia and hypercapnia (HCC) 12/12/2015   Allergic rhinitis 02/13/2010   Qualifier: Diagnosis of  By: Wynona Luna    Angina at rest [facility]) 10/22/2015   Ataxia 08/10/2013   Atrial fibrillation ([place]) 11/2018   B12 deficiency 08/11/2013   BACK PAIN, LUMBAR 01/29/2009   Qualifier: Diagnosis of  By: Wynona Luna    BENIGN POSITIONAL VERTIGO 11/23/2009   Qualifier: Diagnosis of  By: Wynona Luna    Chronic diastolic heart failure ([place]) 04/24/2015   Chronic pain    left sided-[place]   Coronary 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 ([place]) 11/04/2015   s/p CABG 2004; CFX DES 2011   Essential hypertension 12/12/2006   Qualifier: Diagnosis of  By: [name] RMA, Lucy     GERD 12/12/2006   Qualifier: Diagnosis of  By: Reatha Armour, Lucy     GOUT 12/12/2006   Qualifier: Diagnosis of  By: [name] RMA, Lucy     Hyperlipidemia LDL goal <70 12/12/2006   Qualifier: Diagnosis of  By: [name] RMA, [place]      Hypogonadism male 04/10/2011   Hypothyroidism, acquired 02/15/2013   Insomnia 05/21/2013   Long term current use of anticoagulant therapy 06/24/2011   Obstructive sleep apnea-Failed CPAP 06/17/2007   Failed CPAP Using O 2 for sleep    Paroxysmal atrial fibrillation ([place]) 12/06/2016   Stroke (cerebrum) ([place]) 01/13/2017   also 1996; resultant hemiplegia of dominant side   Thoracic aorta atherosclerosis ([place]) 04/27/2017   Tubular adenoma of colon 05/2010   Type 2 diabetes mellitus without complication, without long-term current use of insulin ([place]) 10/05/2007   Qualifier: Diagnosis of  By: Wynona Luna    Vasovagal syncope     Past Surgical History:  Procedure Laterality Date   CARDIAC CATHETERIZATION N/A 10/25/2015   Procedure: Left Heart Cath and Cors/Grafts Angiography;  Surgeon: Troy Sine, MD;  Location: [place] [facility];  Service: Cardiovascular;  Laterality: N/A;   CARDIOVERSION   06/20/2011   Procedure: CARDIOVERSION;  Surgeon: Larey Dresser, MD;  Location: [place];  Service: Cardiovascular;  Laterality: N/A;   CARDIOVERSION N/A 04/26/2015   Procedure: CARDIOVERSION;  Surgeon: Dorothy Spark, MD;  Location: [facility] END[facility];  Service: Cardiovascular;  Laterality: N/A;   CARDIOVERSION N/A 05/10/2015   Procedure: CARDIOVERSION;  Surgeon: Larey Dresser, MD;  Location: [place];  Service: Cardiovascular;  Laterality: N/A;   CARDIOVERSION N/A 12/10/2018   Procedure: CARDIOVERSION;  Surgeon: Sanda Klein, MD;  Location: [place];  Service: Cardiovascular;  Laterality: N/A;   CATARACT EXTRACTION  05/2010   left eye   CATARACT EXTRACTION  04/2010   right eye   CORONARY ARTERY BYPASS GRAFT     stent   ESOPHAGOGASTRODUODENOSCOPY  03-25-2005   LEFT HEART CATH AND CORS/GRAFTS ANGIOGRAPHY N/A 02/19/2018   Procedure: LEFT HEART CATH AND CORS/GRAFTS ANGIOGRAPHY;  Surgeon: Martinique, Peter M, MD;  Location: [place] CV LAB;  Service: Cardiovascular;  Laterality: N/A;   NASAL SEPTUM SURGERY     PERCUTANEOUS PLACEMENT INTRAVASCULAR STENT CERVICAL CAROTID ARTERY  03-2009; using a drug-eluting platform of the circumflex cornoray artery with a 3.0 x 18 [employer] Promus drug-eluting platform post dilated to 3.75 with a noncompliant balloon.   TEE WITHOUT CARDIOVERSION  06/20/2011   Procedure: TRANSESOPHAGEAL ECHOCARDIOGRAM (TEE);  Surgeon: Larey Dresser, MD;  Location: [place];  Service: Cardiovascular;  Laterality: N/A;     Home Medications:  Prior to Admission medications   Medication Sig Start Date End Date Taking? Authorizing Provider  ACCU-CHEK GUIDE test strip USE UP TO FOUR TIMES DAILY AS DIRECTED 09/07/18   [name] Alar, NP  acetaminophen (TYLENOL) 325 MG tablet Take 2 tablets (650 mg total) by mouth every 4 (four) hours as needed for headache or mild pain. 12/23/18   Angiulli, Lavon Paganini, PA-C  allopurinol (ZYLOPRIM) 100 MG tablet Take 1  tablet (100 mg total) by mouth 2 (two) times daily. 12/23/18   Angiulli, Lavon Paganini, PA-C  atorvastatin (LIPITOR) 40 MG tablet Take 1 tablet (40 mg total) by mouth daily. 12/23/18   Angiulli, Lavon Paganini, PA-C  blood glucose meter kit and supplies KIT Dispense based on patient and insurance preference. Use up to four times daily as directed. (FOR ICD-9 250.00, 250.01). 08/10/18   [name] Alar, NP  diltiazem (CARDIZEM CD) 240 MG 24 hr capsule Take 1 capsule (240 mg total) by mouth 2 (two) times daily. 12/23/18   Angiulli, Lavon Paganini, PA-C  ferrous sulfate 325 (65 FE) MG tablet Take 1 tablet (325 mg total) by mouth 2 (two) times daily with a meal. 12/23/18   Angiulli, Lavon Paganini, PA-C  fluticasone (FLONASE) 50 MCG/ACT nasal spray Place 2 sprays into both nostrils daily. Patient taking differently: Place 2 sprays into both nostrils daily as needed for allergies.  06/02/14   Brunetta Jeans, PA-C  folic acid (FOLVITE) 1 MG tablet Take 1 tablet (1 mg total) by mouth daily. 12/23/18   Angiulli, Lavon Paganini, PA-C  furosemide (LASIX) 40 MG tablet Take 1 tablet (40 mg total) by mouth daily as needed for edema (edema and weight gain). 12/16/18   Sande Rives E, PA-C  gabapentin (NEURONTIN) 100 MG capsule Take 1 capsule (100 mg total) by mouth at bedtime. 12/23/18   Angiulli, Lavon Paganini, PA-C  levothyroxine (SYNTHROID) 112 MCG tablet Take 1 tablet (112 mcg total) by mouth daily before breakfast. 12/23/18   Angiulli, Lavon Paganini, PA-C  metFORMIN (GLUCOPHAGE) 500 MG tablet Take 1 tablet (500 mg total) by mouth daily with breakfast. 12/23/18   Angiulli, Lavon Paganini, PA-C  Multiple Vitamins-Minerals (MULTIVITAMIN GUMMIES MENS PO) Take 2 tablets by mouth daily with breakfast.     [provider]  nitroGLYCERIN (NITROSTAT) 0.4 MG SL tablet Place 1 tablet (0.4 mg total) under the tongue every 5 (five) minutes as needed for chest pain. Up to 3 doses 03/05/15   [name] Alar, NP  OXYGEN Inhale 2 L into the lungs at bedtime.     [provider]  pantoprazole (PROTONIX) 40 MG tablet Take 1 tablet (40 mg total) by mouth every morning. 12/23/18   Angiulli, Lavon Paganini, PA-C  senna-docusate (SENOKOT-S) 8.6-50 MG tablet Take 2 tablets by mouth 2 (two) times daily. 11/29/18   Elgergawy, Silver Huguenin, MD  tiZANidine (ZANAFLEX) 2 MG tablet Take 1 tablet (2 mg total) by mouth at bedtime. 12/23/18   Angiulli, Lavon Paganini, PA-C  traMADol (ULTRAM) 50 MG tablet TAKE 1 TABLET BY MOUTH TWICE A DAY Patient taking differently: Take 50 mg by mouth 2 (two) times daily.  05/12/18  Kirsteins, Luanna Salk, MD  vitamin B-12 1000 MCG tablet Take 1 tablet (1,000 mcg total) by mouth daily. Patient taking differently: Take 2,000 mcg by mouth daily.  08/11/13   Orson Eva, MD    Inpatient Medications: Scheduled Meds:  allopurinol  100 mg Oral BID   atorvastatin  40 mg Oral q1800   diltiazem  240 mg Oral BID   ferrous sulfate  325 mg Oral BID WC   folic acid  1 mg Oral Daily   gabapentin  100 mg Oral QHS   insulin aspart  0-15 Units Subcutaneous BID WC   levothyroxine  112 mcg Oral QAC breakfast   magnesium citrate  1 Bottle Oral Once   metFORMIN  500 mg Oral Q breakfast   senna-docusate  2 tablet Oral BID   tiZANidine  2 mg Oral QHS   traMADol  50 mg Oral Q6H   Continuous Infusions:  PRN Meds: acetaminophen, magnesium citrate, sorbitol, traZODone  Allergies:   No Known Allergies  Social History:   Social History   Socioeconomic History   Marital status: Married    Spouse name: [name]   Number of children: Not on file   Years of education: 14   Highest education level: Not on file  Occupational History   Occupation: retired    Fish farm manager: OTHER    Comment: Education administrator strain: Not on file   Food insecurity    Worry: Not on file    Inability: Not on file   Transportation needs    Medical: Not on file    Non-medical: Not on file  Tobacco Use   Smoking status: Never  Smoker   Smokeless tobacco: Never Used  Substance and Sexual Activity   Alcohol use: No    Alcohol/week: 0.0 standard drinks    Comment: 1 beer a month   Drug use: No   Sexual activity: Not Currently  Lifestyle   Physical activity    Days per week: Not on file    Minutes per session: Not on file   Stress: Not on file  Relationships   Social connections    Talks on phone: Not on file    Gets together: Not on file    Attends religious service: Not on file    Active member of club or organization: Not on file    Attends meetings of clubs or organizations: Not on file    Relationship status: Not on file   Intimate partner violence    Fear of current or ex partner: Not on file    Emotionally abused: Not on file    Physically abused: Not on file    Forced sexual activity: Not on file  Other Topics Concern   Not on file  Social History Narrative   Pt lives with wife. Does have stairs, but patient doesn't use them. Pt has completed technical school    Family History:   Family History  Problem Relation Age of Onset   Lung cancer Father        deceased   Stroke Mother        deceased-MINISTROKES     ROS:  Please see the history of present illness.  All other ROS reviewed and negative.     Physical Exam/Data:   Vitals:   12/22/18 1342 12/22/18 1954 12/23/18 0439 12/23/18 0500  BP: 124/66 120/62 120/64   Pulse: 73 87 65   Resp: 19 18 18    Temp:  98.7 F (37.1 C) 98.5 F (36.9 C) 98 F (36.7 C)   TempSrc: Oral     SpO2: 98% 96% 97%   Weight:    110.7 kg    Intake/Output Summary (Last 24 hours) at 12/23/2018 1014 Last data filed at 12/22/2018 2140 Gross per 24 hour  Intake 840 ml  Output 200 ml  Net 640 ml   Last 3 Weights 12/23/2018 12/21/2018 12/20/2018  Weight (lbs) 244 lb 0.8 oz 245 lb 2.4 oz 243 lb 6.2 oz  Weight (kg) 110.7 kg 111.2 kg 110.4 kg     Body mass index is 30.5 kg/m.  General:  Well nourished, well developed, in no acute distress HEENT:  normal Lymph: no adenopathy Neck: no JVD Endocrine:  No thryomegaly Vascular: No carotid bruits; FA pulses 2+ bilaterally without bruits  Cardiac:  normal S1, S2; RRR; no murmur  Lungs:  clear to auscultation bilaterally, no wheezing, rhonchi or rales  Abd: soft, nontender, no hepatomegaly  Ext: lower left leg edema (chronic since stroke) Musculoskeletal:  No deformities, BUE and BLE strength normal and equal Skin: warm and dry  Neuro:  CNs 2-12 intact, no focal abnormalities noted Psych:  Normal affect   EKG:  The EKG was personally reviewed and demonstrates:  Afib 85 bpm, nonspecific T wave changes Telemetry:  Telemetry was personally reviewed and demonstrates:  N/A  Relevant CV Studies:  LHC 02/2017  Ost RCA to Prox RCA lesion is 100% stenosed.  Ost LAD to Prox LAD lesion is 95% stenosed.  Ost 1st Diag lesion is 100% stenosed.  Dist LAD lesion is 95% stenosed.  Ost Cx to Prox Cx lesion is 100% stenosed.  Prox Cx to Mid Cx lesion is 100% stenosed.  LIMA graft was visualized by angiography and is normal in caliber.  The graft exhibits no disease.  SVG graft was visualized by angiography and is normal in caliber.  The graft exhibits no disease.  SVG graft was visualized by angiography and is normal in caliber.  The graft exhibits no disease.  Origin to Prox Graft lesion is 100% stenosed.  LV end diastolic pressure is normal.   1. Left dominant circulation 2. Severe 3 vessel occlusive CAD.    - 95% ostial LAD    - 100% first diagonal    - 100% ostial LCx. This is new since 2017 - was 95% prior    - 100% nondominant RCA 3. Patent LIMA to the LAD. There is chronic severe disease in the apical LAD 4. Patent SVG to the first diagonal and SVG to the first OM as a Y graft. 5. Chronic occlusion of the SVG to terminal LCx 6. Normal LVEDP  Plan: continue medical management. Will resume IV heparin until coumadin is again therapeutic given history of CVA.   Echo  2019 - Left ventricle: The cavity size was normal. There was mild focal   basal hypertrophy of the septum. Systolic function was normal.   The estimated ejection fraction was in the range of 55% to 60%.   Wall motion was normal; there were no regional wall motion   abnormalities. Doppler parameters are consistent with abnormal   left ventricular relaxation (grade 1 diastolic dysfunction).   Doppler parameters are consistent with elevated ventricular   end-diastolic filling pressure. - Mitral valve: Calcified annulus. Mildly thickened leaflets .   There was mild regurgitation. - Left atrium: The atrium was mildly dilated. - Right ventricle: The cavity size was mildly dilated. Wall   thickness  was normal. Systolic function was mildly reduced. - Right atrium: The atrium was normal in size. - Tricuspid valve: There was mild regurgitation. - Pulmonary arteries: Systolic pressure was within the normal   range. - Inferior vena cava: The vessel was normal in size. - Pericardium, extracardiac: There was no pericardial effusion.  Laboratory Data:  High Sensitivity Troponin:   Recent Labs  Lab 11/27/18 1750 12/08/18 1518 12/08/18 2029 12/09/18 0024 12/23/18 0854  TROPONINIHS 49* 24* 30* 29* 10     Chemistry Recent Labs  Lab 12/17/18 0632  NA 136  K 3.8  CL 97*  CO2 30  GLUCOSE 117*  BUN 21  CREATININE 0.96  CALCIUM 8.5*  GFRNONAA >60  GFRAA >60  ANIONGAP 9    Recent Labs  Lab 12/17/18 0632  PROT 5.8*  ALBUMIN 2.9*  AST 63*  ALT 61*  ALKPHOS 55  BILITOT 3.8*   Hematology Recent Labs  Lab 12/17/18 0632  WBC 8.9  RBC 3.05*  HGB 9.2*  HCT 27.9*  MCV 91.5  MCH 30.2  MCHC 33.0  RDW 15.3  PLT 204   BNPNo results for input(s): BNP, PROBNP in the last 168 hours.  DDimer No results for input(s): DDIMER in the last 168 hours.   Radiology/Studies:  No results found.  Assessment and Plan:   Chest Discomfort/CAD s/p CABG in 2004, DES to LCx in 2011 Patient  was admitted 9/23 for afib and acute heart failure. He was diuresed and had cardioversion with brief event of NSR but converted back to afib. Course was complicated by spontaneous chest hematoma. ASA and coumadin were discontinued. 10/1 patient admitted to inpatient rehab. On day of discharge (today)patient complained of intermittent chest pain at rest that had apparently been ongoing for the last 3 weeks. NO sob. No N/V or diaphoresis - EKG showed afib rate controlled, no ischemic changes  - HS troponin 10 >11 - Meds inculdue cardizem 266m, lipitor 40 mg.  BB d/c's 2/2 to bradycardia. Ace d/c'd 2/2 to hypotension in the setting of acute bleed. - Last cath was 2019 showing severe 3 V CAD and the SVT to LCx was occluded, however the LIMA to LAD, SVG to D, and SVG to OM were all patent - Echo 2019 showed EF 55-60%, G1DD, mild MR, LA mildly dilated, RV systolic function was mildly reduced, mild TR, no regional wall motion abnormalities - Continue to trend troponin - Low suspicion for ACS at this time given normal troponin and nonischemic EKG. No further work-up at this time. Will plan to follow-up with patient as outpatient.   Paroxysmal afib - Patient with longstanding history of afib on cardizem. Follows with Afib clinic.   Was previously on coumadin. - Amio was d/c'd for shortness of breath in 2018 - Patient recently admitted 9/23 for afib rvr and underwent unsuccessful cardioversion. Course complicated by chest hematoma. A/c was stopped. Amio was tried again. Patient was discharged to inpatient rehab. Now complaining of chest pain - Patient has been rate controlled on cardizem 240 mg - EKG showed rate controlled afib. Troponin normal - Hgb 9.2 on 10/2. Will recheck CBC >> HGB 10.6 - Will check potassium and mag - TSH 1.8 11/28/18 - Patient does not complain of shortness of breath on exertion - CHADSVASC = 9 (CHF, hypertension, DM, age, stroke, vascular disease).  - No longer on  anticoagulation(and reluctant to start) due to recent chest hematoma. Possible candidate for watchman or other left atrial appendage closure device  Chronic diastolic  Heart Failure - Echo 2019 EF 55-60% with grade 1 DD - At discharge patient was discharged with Lasix 20 as needed for swelling - It doesn't appear patient has been needing lasix throughout the stay here in rehab. Has been wearing compression stockings.  - Wt is actually down since discharge. 244 lbs today. At discharge 12/16/18 weight was 248 lbs.  - Patient has left pedal edema on exam, but this is chronic for him.  - At baseline patient uses 2L O2 at night. O2 has been stable on RA during PT.   Hypertension - ACE and BB held as above - BP have been stable. 120/64 today  Hyperlipidemia - Continue Lipitor 40 mg - LDL 49 12/09/18  DM2 - A1C 6.9 11/28/18  Hypothyroidism - on levothyroxine  For questions or updates, please contact [place] Please consult [email or web address] for contact info under     Signed, Evangelynn Lochridge Ninfa Meeker, PA-C  12/23/2018 10:14 AM

## 2018-12-23 NOTE — Progress Notes (Signed)
Social Work Patient ID: [name], male   DOB: 07-30-1945, 73 y.o.   MRN: [id number]  Met with pt and wife when here yesterday to discuss team conference goals supervision-min level and discharge date 10/9. Wife is concerned he is not ready to go home, asked her to go to therapies with him. She is unsure if she will, she had to follow him at home prior to admission due to falling. Seems pt is better now than he was at home prior to admission. Discussed home health follow up and equipment. Pt has all equipment and no pref with home health. Will work on discharge needs for tomorrow. Pt states: " She is one of those nervous people who is always worried but does not need to be."

## 2018-12-23 NOTE — Progress Notes (Signed)
Social Work Discharge Note   The overall goal for the admission was met for:   Discharge location: Yes-HOME WITH WIFE AND TWO GRANDDAUGHTERS  Length of Stay: Yes-8 DAYS  Discharge activity level: Yes-SUPERVISION-MIN LEVEL  Home/community participation: Yes  Services provided included: MD, RD, PT, OT, SLP, RN, CM, Pharmacy, Neuropsych and SW  Financial Services: Medicare and Other: [place]  Follow-up services arranged: Home Health: [facility] and Patient/Family has no preference for HH/DME agencies  Comments (or additional information):WIFE QUITE ANXIOUS AND FEELS HE IS NOT READY TO [place]. ALTHOUGH REACHED GOALS. ASKED HER TO GO TO THERAPIES WITH HIM AND SHE DID NOT WHILE HERE. GRANDDAUGHTER'S TO CHECK BS 2X DAILY BOTH WIFE AND PT HAD NO INTEREST TO LEARN HOW TO CHECK. PT ON HOME O2 PRIOR TO ADMISSION AT NIGHT AND AT TIMES USES DURING THE DAY WHEN FEELS NEEDS. HAS ALL EQUIPMENT  Patient/Family verbalized understanding of follow-up arrangements: Yes  Individual responsible for coordination of the follow-up plan: SELF & GLORIA-WIFE  Confirmed correct DME delivered: Elease Hashimoto 12/23/2018    Elease Hashimoto

## 2018-12-23 NOTE — Progress Notes (Signed)
Occupational Therapy Discharge Summary  Patient Details  Name: [name] MRN: [id number] Date of Birth: 1945/10/27  Today's Date: 12/23/2018 OT Individual Time: 0705-0800 OT Individual Time Calculation (min): 55 min    Patient has met 10 of 10 long term goals due to improved activity tolerance, improved balance, postural control, ability to compensate for deficits and improved coordination.  Patient to discharge at overall S- min A level.  Pt's wife has not attended family education but has been educated about pt's need for 24/7 and verbalized understanding and safety concerns.  Reasons goals not met: all goals met  Recommendation:  Patient will benefit from ongoing skilled OT services in home health setting to continue to advance functional skills in the area of BADL and Reduce care partner burden.  Equipment: No equipment provided  Reasons for discharge: treatment goals met  Patient/family agrees with progress made and goals achieved: Yes   OT Intervention: Upon entering the room, pt supine in bed with no c/o pain and eating breakfast. Pt requesting to finish meal before exiting the bed. While eating, OT reviewing recommendations for discharge and home health OT. Pt verbalized understanding and asking questions as appropriate. Pt performed supine >sit with supervision to EOB. Pt refused bathing but agreeable to dressing while seated on EOB. Pt donning UB shirt with supervision overall. Pt applying suspenders to pants first and threading onto B LEs with min A. Pt standing with supervision and use of RW to pull pants up hip and fasten buttons. Pt requesting to return to bed at end of session. Call bell and all needed items within reach. Pt with no further questions at this time.  OT Discharge Precautions/Restrictions  Precautions Precautions: Fall Vital Signs Therapy Vitals Temp: 98 F (36.7 C) Pulse Rate: 65 Resp: 18 BP: 120/64 Patient Position (if appropriate):  Lying Oxygen Therapy SpO2: 97 % O2 Device: Nasal Cannula O2 Flow Rate (L/min): 2 L/min Pain Pain Assessment Pain Scale: 0-10 Pain Score: 0-No pain ADL ADL Eating: Not assessed Grooming: Not assessed Upper Body Bathing: Supervision/safety Where Assessed-Upper Body Bathing: Edge of bed Lower Body Bathing: Moderate assistance Where Assessed-Lower Body Bathing: Edge of bed Upper Body Dressing: Setup Where Assessed-Upper Body Dressing: Edge of bed Lower Body Dressing: Maximal assistance Where Assessed-Lower Body Dressing: Edge of bed Toileting: Not assessed Toilet Transfer: Contact guard Toilet Transfer Method: Ambulating(RW) Social research officer, government: Not assessed Vision Baseline Vision/History: Wears glasses Wears Glasses: At all times Patient Visual Report: No change from baseline Vision Assessment?: No apparent visual deficits Cognition Overall Cognitive Status: Within Functional Limits for tasks assessed Arousal/Alertness: Awake/alert Orientation Level: Oriented X4 Sensation Sensation Light Touch: Impaired Detail Light Touch Impaired Details: Impaired RLE;Impaired LLE Coordination Gross Motor Movements are Fluid and Coordinated: No Fine Motor Movements are Fluid and Coordinated: No Coordination and Movement Description: L side weaker than R Finger Nose Finger Test: Ataxia B UEs, Lt>Rt Motor  Motor Motor: Hemiplegia;Ataxia;Abnormal postural alignment and control Motor - Skilled Clinical Observations: residual L sided deficits from hx of CVA Mobility  Bed Mobility Bed Mobility: Rolling Right;Supine to Sit Rolling Right: Supervision/verbal cueing Supine to Sit: Supervision/Verbal cueing Transfers Sit to Stand: Supervision/Verbal cueing Stand to Sit: Supervision/Verbal cueing  Trunk/Postural Assessment  Cervical Assessment Cervical Assessment: Exceptions to WFL(forward head) Thoracic Assessment Thoracic Assessment: Exceptions to [facility] Lumbar Assessment Lumbar  Assessment: Within Functional Limits Postural Control Postural Control: Within Functional Limits  Balance Balance Balance Assessed: Yes Static Sitting Balance Static Sitting - Balance Support: Feet supported Static Sitting -  Level of Assistance: 6: Modified independent (Device/Increase time) Dynamic Sitting Balance Dynamic Sitting - Balance Support: Feet supported Dynamic Sitting - Level of Assistance: 5: Stand by assistance Static Standing Balance Static Standing - Balance Support: Bilateral upper extremity supported;During functional activity Static Standing - Level of Assistance: 5: Stand by assistance Dynamic Standing Balance Dynamic Standing - Balance Support: During functional activity Dynamic Standing - Level of Assistance: 5: Stand by assistance Extremity/Trunk Assessment RUE Assessment Active Range of Motion (AROM) Comments: 3-/5, shoulder flexion + abduction ~45 degrees General Strength Comments: 3-/5 grossly LUE Assessment Active Range of Motion (AROM) Comments: 3-/5 shoulder flexion + abduction slightly more limited than on the Rt side General Strength Comments: 3-/5 grossly, ataxic, able to use at gross assist/nondominant level during self care   Gypsy Decant 12/23/2018, 7:22 AM

## 2018-12-23 NOTE — Progress Notes (Signed)
Physical Therapy Session Note  Patient Details  Name: [name] MRN: [id number] Date of Birth: 24-Jan-1946  Today's Date: 12/23/2018 Pt missed 60 min treatment due to being on hold, awaiting cardiology consult per nursing.        Therapy/Group: Individual Therapy  Jerrilyn Cairo 12/23/2018, 7:49 AM

## 2018-12-23 NOTE — Progress Notes (Addendum)
[place] PHYSICAL MEDICINE & REHABILITATION PROGRESS NOTE   Subjective/Complaints:  Pt co of chest discomfort this am.  Difficult to describe, "fluttering" but also uncomfortable.  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 No results for input(s): WBC, HGB, HCT, PLT in the last 72 hours. No results for input(s): NA, K, CL, CO2, GLUCOSE, BUN, CREATININE, CALCIUM in the last 72 hours.  Intake/Output Summary (Last 24 hours) at 12/23/2018 0819 Last data filed at 12/22/2018 2140 Gross per 24 hour  Intake 1080 ml  Output 200 ml  Net 880 ml     Physical Exam: Vital Signs Blood pressure 120/64, pulse 65, temperature 98 F (36.7 C), resp. rate 18, weight 110.7 kg, SpO2 97 %.  Vitals reviewed General: No acute distress; reclined in bed  NAD; B/L arm IVs; wearing suspenders Mood and affect are appropriate; extremely talkative. Heart: irreg irreg no rubs murmurs or extra sounds Lungs: Clear to auscultation, breathing unlabored, no rales or wheezes Abdomen: Positive bowel sounds, soft nontender to palpation, nondistended Extremities: No clubbing, cyanosis, or edema Skin: No evidence of breakdown, no evidence of rash Neurologic: Cranial nerves II through XII intact, motor strength is 5/5 in RIght and 4/5 left  deltoid, bicep, tricep, grip, hip flexor, knee extensors, ankle dorsiflexor and plantar flexor unchanged Sensory exam reduced  sensation to light touch left  upper and lower extremities Cerebellar exam dysmetria on left  Musculoskeletal: Full range of motion in all 4 extremities. No joint swelling   Assessment/Plan: 1. Functional deficits secondary to Encephalopathy which require 3+ hours per day of interdisciplinary therapy in a comprehensive inpatient rehab setting.  Physiatrist  is providing close team supervision and 24 hour management of active medical problems listed below.  Physiatrist and rehab team continue to assess barriers to discharge/monitor patient progress toward functional and medical goals  Care Tool:  Bathing  Bathing activity did not occur: Refused Body parts bathed by patient: Right arm, Left arm, Chest, Abdomen, Front perineal area, Right upper leg, Left upper leg, Face   Body parts bathed by helper: Buttocks, Right lower leg, Left lower leg     Bathing assist Assist Level: Minimal Assistance - Patient > 75%     Upper Body Dressing/Undressing Upper body dressing   What is the patient wearing?: Pull over shirt    Upper body assist Assist Level: Set up assist    Lower Body Dressing/Undressing Lower body dressing      What is the patient wearing?: Underwear/pull up, Pants     Lower body assist Assist for lower body dressing: Contact Guard/Touching assist     Toileting Toileting    Toileting assist Assist for toileting: Independent with assistive device Assistive Device Comment: urinal   Transfers Chair/bed transfer  Transfers assist     Chair/bed transfer assist level: Contact Guard/Touching assist     Locomotion Ambulation   Ambulation assist      Assist level: Contact Guard/Touching assist Assistive device: Walker-rolling Max distance: 132 feet   Walk 10 feet activity   Assist     Assist level: Contact Guard/Touching assist Assistive device: Walker-rolling   Walk 50 feet activity   Assist    Assist level: Contact Guard/Touching assist Assistive device: Walker-rolling    Walk 150 feet activity   Assist  Walk 150 feet activity did not occur: Safety/medical concerns         Walk 10 feet on uneven surface  activity   Assist     Assist level: Minimal Assistance - Patient > 75% Assistive device: Aeronautical engineer Will patient use wheelchair at discharge?:  Yes(goals written )      Wheelchair assist level: Moderate Assistance - Patient 50 - 74% Max wheelchair distance: 60    Wheelchair 50 feet with 2 turns activity    Assist        Assist Level: Moderate Assistance - Patient 50 - 74%   Wheelchair 150 feet activity     Assist  Wheelchair 150 feet activity did not occur: Safety/medical concerns       Blood pressure 120/64, pulse 65, temperature 98 F (36.7 C), resp. rate 18, weight 110.7 kg, SpO2 97 %.  Medical Problem List and Plan: 1.Acute encephalopathysecondary to multifactorial/atrial fibrillation/spontaneous acute chest wall hematoma secondary to anticoagulation Hold PT/OT d/t dhest discomfort , check EKG, troponins if ok may resume therapy Discussed with cardiology Dr Stanford Breed who will have cardiology team eval today  2. Antithrombotics: -DVT/anticoagulation:SCD -antiplatelet therapy: N/A 3. 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 Mood:emotional support -antipsychotic agents: N/A 5. Neuropsych: This patientisgenerallycapable of making decisions on hisown behalf. 6. Skin/Wound Care:Routine skin checks 7. Fluids/Electrolytes/Nutrition:Routine in and outs with follow-up chemistries 8. Atrial fibrillation. Unsuccessful cardioversion. Continue Cardizem CD 240 mg twice daily. Follow-up cardiology services. No plan to resume anticoagulation at this time due to acute chest wall hematoma -heart rate controlled at present EKG afib normal rate, no ST elevations 9. Acute blood loss anemia. Continue iron supplement. Follow-up CBC 10. Hyperlipidemia. Lipitor 11. Diabetes mellitus. Hemoglobin A1c 6.9. SSI/Glucophage  500 mg daily -monitor cbg's ac/hs -adjust regimen as needed    CBG (last 3)  Recent Labs    12/22/18 1226 12/22/18 1734 12/23/18 0612  GLUCAP 98 115* 99  Stable 10/08 12. CAD with CABG 2004. No chest pain or increasing shortness of breath. 13. History of CVA with left-sided residual weakness. Again no anticoagulation at this time 14. Hypothyroidism. Synthroid 15. OSA. Patient did use 2 L oxygen nightly prior to admission. 16.Hyperlipidemia.Lipitor  17.  Social lives with wife, grown children and grandchildren in home as well  19. Severe constipation-increase senna S to 2 po BID had lg BM this am per Pt    LOS: 7 days A FACE TO FACE EVALUATION WAS PERFORMED  Charlett Blake 12/23/2018, 8:19 AM

## 2018-12-23 NOTE — Discharge Instructions (Signed)
Inpatient Rehab Discharge Instructions  [name] Discharge date and time: No discharge date for patient encounter.   Activities/Precautions/ Functional Status: Activity: activity as tolerated Diet: regular diet Wound Care: none needed Functional status:  ___ No restrictions     ___ Walk up steps independently ___ 24/7 supervision/assistance   ___ Walk up steps with assistance ___ Intermittent supervision/assistance  ___ Bathe/dress independently ___ Walk with walker     _x__ Bathe/dress with assistance ___ Walk Independently    ___ Shower independently ___ Walk with assistance    ___ Shower with assistance ___ No alcohol     ___ Return to work/school ________  Special Instructions: No driving smoking or alcohol.  No Coumadin or anticoagulation until follow-up with cardiology services  COMMUNITY REFERRALS UPON DISCHARGE:    Home Health:   PT, OT, RN  Agency:[facility]   Phone:[phone] Date of last service:12/24/2018  Medical Equipment/Items Ordered:HAS ALL NEEDED EQUIPMENT AND HOME O2   Agency/Supplier:[facility]-RECEIVES HIS O2 FROM  My questions have been answered and I understand these instructions. I will adhere to these goals and the provided educational materials after my discharge from the hospital.  Patient/Caregiver Signature _______________________________ Date __________  Clinician Signature _______________________________________ Date __________  Please bring this form and your medication list with you to all your follow-up doctor's appointments.

## 2018-12-23 NOTE — Progress Notes (Signed)
Physical Therapy Discharge Summary  Patient Details  Name: Armstrong Creasy MRN: [id number] Date of Birth: [birth year]  Today's Date: 12/23/2018 PT Individual Time: 1420-1520 PT Individual Time Calculation (min): 60 min   Patient has met 14 of 14 long term goals due to increased balance, coordination, and strength  Patient to discharge at an ambulatory level of close Supervision.   Patient's care partner is independent to provide the necessary physical assistance at discharge.  Reasons goals not met: N/A, all goals met at D/C  Recommendation:  Patient will benefit from ongoing skilled PT services in home health setting to continue to advance safe functional mobility, address ongoing impairments in balance, strength, coordination, and minimize fall risk.  Equipment: No equipment provided  Reasons for discharge: treatment goals met and discharge from hospital  Patient/family agrees with progress made and goals achieved: Yes   PT Treatment/Intervention  SPT performed treatment and discharge session today. See below for details. Pt received upright in wheelchair upon arrival for therapy with MD present discussing cardiology results. Pt "fed up" but agreeable to treatment and discharge session. Pt completed car transfer with RW and close supervision for safety. Pt self propelled wheelchair 50 feet with minA and verbal cueing for setup and instruction. Pt completed 6mT with RW, 1st trial 0.320m and 2nd trial 0.4830m Less than 0.8 m/s is indicative of a fall risk and decreased community ambulation. Pt transported back to room in wheelchair and left sitting upright in chair, call bell within reach. All needs met at this time.     PT Discharge Precautions/Restrictions Precautions Precautions: Fall Restrictions Weight Bearing Restrictions: No Vital Signs Therapy Vitals Temp: 98.9 F (37.2 C) Temp Source: Oral Pulse Rate: 76 Resp: 18 BP: 117/62 Patient Position (if appropriate):  Sitting Oxygen Therapy SpO2: 97 % O2 Device: Room Air Pain Denies pain    Vision/Perception  Wears glasses all the time     Cognition Orientation Level: Oriented X4 Sensation Sensation Light Touch: Impaired by gross assessment Light Touch Impaired Details: Impaired RLE;Impaired LLE Additional Comments: pt reports entire L side of body doesn't feel the same as R side Coordination Gross Motor Movements are Fluid and Coordinated: No Coordination and Movement Description: L side weaker than R Motor  Motor Motor: Hemiplegia;Ataxia;Abnormal postural alignment and control Motor - Skilled Clinical Observations: residual L sided deficits from hx of CVA  Mobility Bed Mobility Bed Mobility: Rolling Right;Supine to Sit;Sit to Supine Rolling Right: Supervision/verbal cueing Supine to Sit: Supervision/Verbal cueing Sit to Supine: Supervision/Verbal cueing Transfers Transfers: Sit to Stand;Stand to Sit;Stand Pivot Transfers Sit to Stand: Supervision/Verbal cueing Stand to Sit: Supervision/Verbal cueing Stand Pivot Transfers: Supervision/Verbal cueing Stand Pivot Transfer Details: Verbal cues for precautions/safety Stand Pivot Transfer Details (indicate cue type and reason): supervision / verbal cueing for safety Transfer (Assistive device): Rolling walker Locomotion  Gait Ambulation: Yes Gait Assistance: Supervision/Verbal cueing Gait Distance (Feet): 175 Feet Assistive device: Rolling walker Gait Assistance Details: Verbal cues for precautions/safety Gait Assistance Details: Close supervision with RW with verbal cueing for safety awareness Gait Gait: Yes Gait velocity: decreased Stairs / Additional Locomotion Stairs: Yes Stairs Assistance: Supervision/Verbal cueing Stair Management Technique: Two rails Number of Stairs: 8 Height of Stairs: 4 Ramp: Contact Guard/touching assist Wheelchair Mobility Wheelchair Mobility: Yes Wheelchair Assistance: ConChemical engineeright upper extremity;Right lower extremity Wheelchair Parts Management: Needs assistance Distance: 50  Trunk/Postural Assessment  Cervical Assessment Cervical Assessment: Exceptions to WFL(forward head) Thoracic Assessment Thoracic Assessment: Exceptions to WFLVision One [facility] And Surgery Center LLCmbar Assessment  Lumbar Assessment: Within Functional Limits Postural Control Postural Control: Within Functional Limits  Balance Balance Balance Assessed: Yes Standardized Balance Assessment Standardized Balance Assessment: Timed Up and Go Test Timed Up and Go Test TUG: Normal TUG Normal TUG (seconds): 37.9 Static Sitting Balance Static Sitting - Balance Support: Feet supported Static Sitting - Level of Assistance: 6: Modified independent (Device/Increase time) Dynamic Sitting Balance Dynamic Sitting - Balance Support: Feet supported Dynamic Sitting - Level of Assistance: 5: Stand by assistance Static Standing Balance Static Standing - Balance Support: Bilateral upper extremity supported;During functional activity Static Standing - Level of Assistance: 5: Stand by assistance Dynamic Standing Balance Dynamic Standing - Balance Support: During functional activity Dynamic Standing - Level of Assistance: 5: Stand by assistance Extremity Assessment      RLE Assessment RLE Assessment: Within Functional Limits General Strength Comments: grossly WFL, R stronger than L LE LLE Assessment LLE Assessment: Within Functional Limits General Strength Comments: grossly WFL, R stronger than L LE  Olena Leatherwood, SPT  12/23/2018, 6:03 PM

## 2018-12-24 LAB — GLUCOSE, CAPILLARY: Glucose-Capillary: 93 mg/dL (ref 70–99)

## 2018-12-24 MED ORDER — TRAMADOL HCL ER 200 MG PO TB24: 200.0000 mg | ORAL_TABLET | Freq: Every day | ORAL | 0 refills | Status: DC

## 2018-12-24 MED ORDER — TRAMADOL HCL 50 MG PO TABS: 50.0000 mg | ORAL_TABLET | Freq: Two times a day (BID) | ORAL | 0 refills | Status: DC

## 2018-12-24 NOTE — Progress Notes (Signed)
Pt was D/C from the unit with wife and all personal belongings.All questions were answered and pt agrees to all F/U appt.

## 2018-12-24 NOTE — Progress Notes (Signed)
[place] PHYSICAL MEDICINE & REHABILITATION PROGRESS NOTE   Subjective/Complaints:  Pt tried some tums last noc with some improvement in "CP" Appreciate cardiology note    ROS- denies CP, SOB (wears O2 at home only at noc , now continuous) Objective:   No results found. Recent Labs    12/23/18 1301  WBC 6.4  HGB 10.6*  HCT 34.8*  PLT 284   Recent Labs    12/23/18 1301  NA 137  K 4.5  CL 100  CO2 28  GLUCOSE 128*  BUN 15  CREATININE 1.06  CALCIUM 9.1    Intake/Output Summary (Last 24 hours) at 12/24/2018 0848 Last data filed at 12/24/2018 0824 Gross per 24 hour  Intake 920 ml  Output 900 ml  Net 20 ml     Physical Exam: Vital Signs Blood pressure 115/68, pulse 75, temperature 97.9 F (36.6 C), temperature source Oral, resp. rate 18, weight 112 kg, SpO2 100 %.  Vitals reviewed General: No acute distress; reclined in bed  NAD; B/L arm IVs; wearing suspenders Mood and affect are appropriate; extremely talkative. Heart: irreg irreg no rubs murmurs or extra sounds Lungs: Clear to auscultation, breathing unlabored, no rales or wheezes Abdomen: Positive bowel sounds, soft nontender to palpation, nondistended Extremities: No clubbing, cyanosis, or edema Skin: No evidence of breakdown, no evidence of rash Neurologic: Cranial nerves II through XII intact, motor strength is 5/5 in RIght and 4/5 left  deltoid, bicep, tricep, grip, hip flexor, knee extensors, ankle dorsiflexor and plantar flexor unchanged Sensory exam reduced  sensation to light touch left  upper and lower extremities Cerebellar exam dysmetria on left  Musculoskeletal: Full range of motion in all 4 extremities. No joint swelling   Assessment/Plan: 1. Functional deficits secondary to Encephalopathy which require 3+ hours per day of interdisciplinary therapy in a comprehensive inpatient rehab setting.  Physiatrist is providing close team supervision and 24 hour management of active medical problems  listed below.  Physiatrist and rehab team continue to assess barriers to discharge/monitor patient progress toward functional and medical goals  Care Tool:  Bathing  Bathing activity did not occur: Refused Body parts bathed by patient: Right arm, Left arm, Chest, Abdomen, Front perineal area, Right upper leg, Left upper leg, Face   Body parts bathed by helper: Buttocks, Right lower leg, Left lower leg     Bathing assist Assist Level: Minimal Assistance - Patient > 75%(per staff report)     Upper Body Dressing/Undressing Upper body dressing   What is the patient wearing?: Pull over shirt    Upper body assist Assist Level: Supervision/Verbal cueing    Lower Body Dressing/Undressing Lower body dressing      What is the patient wearing?: Underwear/pull up, Pants     Lower body assist Assist for lower body dressing: Supervision/Verbal cueing     Toileting Toileting    Toileting assist Assist for toileting: Supervision/Verbal cueing Assistive Device Comment: urinal   Transfers Chair/bed transfer  Transfers assist     Chair/bed transfer assist level: Supervision/Verbal cueing     Locomotion Ambulation   Ambulation assist      Assist level: Contact Guard/Touching assist Assistive device: Walker-rolling Max distance: 175   Walk 10 feet activity   Assist     Assist level: Contact Guard/Touching assist Assistive device: Walker-rolling   Walk 50 feet activity   Assist    Assist level: Contact Guard/Touching assist Assistive device: Walker-rolling    Walk 150 feet activity   Assist Walk 150  feet activity did not occur: Safety/medical concerns  Assist level: Contact Guard/Touching assist      Walk 10 feet on uneven surface  activity   Assist     Assist level: Contact Guard/Touching assist Assistive device: Walker-rolling   Wheelchair     Assist Will patient use wheelchair at discharge?: No Type of Wheelchair: Manual     Wheelchair assist level: Contact Guard/Touching assist Max wheelchair distance: 50    Wheelchair 50 feet with 2 turns activity    Assist        Assist Level: Contact Guard/Touching assist   Wheelchair 150 feet activity     Assist  Wheelchair 150 feet activity did not occur: Safety/medical concerns       Blood pressure 115/68, pulse 75, temperature 97.9 F (36.6 C), temperature source Oral, resp. rate 18, weight 112 kg, SpO2 100 %.  Medical Problem List and Plan: 1.Acute encephalopathysecondary to multifactorial/atrial fibrillation/spontaneous acute chest wall hematoma secondary to anticoagulation May d/c today   2. Antithrombotics: -DVT/anticoagulation:SCD -antiplatelet therapy: N/A 3. 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 Mood:emotional support -antipsychotic agents: N/A 5. Neuropsych: This patientisgenerallycapable of making decisions on hisown behalf. 6. Skin/Wound Care:Routine skin checks 7. Fluids/Electrolytes/Nutrition:Routine in and outs with follow-up chemistries 8. Atrial fibrillation. Unsuccessful cardioversion. Continue Cardizem CD 240 mg twice daily. Follow-up cardiology services. No plan to resume anticoagulation at this time due to acute chest wall hematoma -heart rate controlled at present EKG afib normal rate, no ST elevations- f/u [name] as OP  9. Acute blood loss anemia. Continue iron supplement. Follow-up CBC 10. Hyperlipidemia. Lipitor 11. Diabetes mellitus. Hemoglobin A1c 6.9. SSI/Glucophage 500 mg daily -monitor cbg's ac/hs -adjust regimen as needed    CBG (last 3)  Recent Labs    12/23/18 0612  12/23/18 1739 12/24/18 0617  GLUCAP 99 104* 93  Stable 10/08 12. CAD with CABG 2004. No chest pain or increasing shortness of breath. 13. History of CVA with left-sided residual weakness. Again no anticoagulation at this time 14. Hypothyroidism. Synthroid 15. OSA. Patient did use 2 L oxygen nightly prior to admission. 16.Hyperlipidemia.Lipitor  17.  Social lives with wife, grown children and grandchildren in home as well  39. Severe constipation-increase senna S to 2 po BID had lg BM this am per Pt   19.  CP likely GERD, cardiology w/u neg.  Take meals while seated, remain OOB x 2 hr after a meal, PRN antacieds- TUMS   LOS: 8 days A FACE TO FACE EVALUATION WAS PERFORMED  Charlett Blake 12/24/2018, 8:48 AM

## 2018-12-27 ENCOUNTER — Telehealth: Payer: Self-pay | Admitting: *Deleted

## 2018-12-27 DIAGNOSIS — E1142 Type 2 diabetes mellitus with diabetic polyneuropathy: Secondary | ICD-10-CM | POA: Diagnosis not present

## 2018-12-27 DIAGNOSIS — G4733 Obstructive sleep apnea (adult) (pediatric): Secondary | ICD-10-CM | POA: Diagnosis not present

## 2018-12-27 DIAGNOSIS — I5033 Acute on chronic diastolic (congestive) heart failure: Secondary | ICD-10-CM | POA: Diagnosis not present

## 2018-12-27 DIAGNOSIS — Z7984 Long term (current) use of oral hypoglycemic drugs: Secondary | ICD-10-CM | POA: Diagnosis not present

## 2018-12-27 DIAGNOSIS — E039 Hypothyroidism, unspecified: Secondary | ICD-10-CM | POA: Diagnosis not present

## 2018-12-27 DIAGNOSIS — I69351 Hemiplegia and hemiparesis following cerebral infarction affecting right dominant side: Secondary | ICD-10-CM | POA: Diagnosis not present

## 2018-12-27 DIAGNOSIS — Z9981 Dependence on supplemental oxygen: Secondary | ICD-10-CM | POA: Diagnosis not present

## 2018-12-27 DIAGNOSIS — I2510 Atherosclerotic heart disease of native coronary artery without angina pectoris: Secondary | ICD-10-CM | POA: Diagnosis not present

## 2018-12-27 DIAGNOSIS — E785 Hyperlipidemia, unspecified: Secondary | ICD-10-CM | POA: Diagnosis not present

## 2018-12-27 DIAGNOSIS — Z951 Presence of aortocoronary bypass graft: Secondary | ICD-10-CM | POA: Diagnosis not present

## 2018-12-27 DIAGNOSIS — M109 Gout, unspecified: Secondary | ICD-10-CM | POA: Diagnosis not present

## 2018-12-27 DIAGNOSIS — I110 Hypertensive heart disease with heart failure: Secondary | ICD-10-CM | POA: Diagnosis not present

## 2018-12-27 DIAGNOSIS — I69354 Hemiplegia and hemiparesis following cerebral infarction affecting left non-dominant side: Secondary | ICD-10-CM | POA: Diagnosis not present

## 2018-12-27 DIAGNOSIS — E538 Deficiency of other specified B group vitamins: Secondary | ICD-10-CM | POA: Diagnosis not present

## 2018-12-27 DIAGNOSIS — I480 Paroxysmal atrial fibrillation: Secondary | ICD-10-CM | POA: Diagnosis not present

## 2018-12-27 DIAGNOSIS — Z9181 History of falling: Secondary | ICD-10-CM | POA: Diagnosis not present

## 2018-12-27 NOTE — Telephone Encounter (Signed)
Transition Care Management Follow-up Telephone Call   Date discharged? 10.9.20   How have you been since you were released from the hospital? Getting stronger   Do you understand why you were in the hospital? yes   Do you understand the discharge instructions? yes   Where were you discharged to? Home with wife and 2 granddaughters   Items Reviewed:  Medications reviewed: pt states he doesn't have list on hand  Allergies reviewed: yes  Dietary changes reviewed: yes  Referrals reviewed: yes   Functional Questionnaire:   Activities of Daily Living (ADLs):   He states they are independent in the following: ambulation, bathing and hygiene, feeding, continence, grooming, toileting and dressing States they require assistance with the following: using walker   Any transportation issues/concerns?: no   Any patient concerns? no   Confirmed importance and date/time of follow-up visits scheduled yes  Provider Appointment booked with PCP 01/07/19  Confirmed with patient if condition begins to worsen call PCP or go to the ER.  Patient was given the office number and encouraged to call back with question or concerns.  : yes

## 2018-12-28 ENCOUNTER — Telehealth: Payer: Self-pay | Admitting: Registered Nurse

## 2018-12-28 DIAGNOSIS — I110 Hypertensive heart disease with heart failure: Secondary | ICD-10-CM | POA: Diagnosis not present

## 2018-12-28 DIAGNOSIS — I69351 Hemiplegia and hemiparesis following cerebral infarction affecting right dominant side: Secondary | ICD-10-CM | POA: Diagnosis not present

## 2018-12-28 DIAGNOSIS — E1142 Type 2 diabetes mellitus with diabetic polyneuropathy: Secondary | ICD-10-CM | POA: Diagnosis not present

## 2018-12-28 DIAGNOSIS — I5033 Acute on chronic diastolic (congestive) heart failure: Secondary | ICD-10-CM | POA: Diagnosis not present

## 2018-12-28 DIAGNOSIS — I480 Paroxysmal atrial fibrillation: Secondary | ICD-10-CM | POA: Diagnosis not present

## 2018-12-28 DIAGNOSIS — I69354 Hemiplegia and hemiparesis following cerebral infarction affecting left non-dominant side: Secondary | ICD-10-CM | POA: Diagnosis not present

## 2018-12-28 NOTE — Telephone Encounter (Signed)
Transitional Care call  Patient name: [name] DOB: 1945/07/30 1. Are you/is patient experiencing any problems since coming home? No a. Are there any questions regarding any aspect of care? No 2. Are there any questions regarding medications administration/dosing? No a. Are meds being taken as prescribed? Yes b. "Patient should review meds with caller to confirm" Medication List Reviewed.  3. Have there been any falls? No 4. Has Home Health been to the house and/or have they contacted you? Yes, [facility]. a. If not, have you tried to contact them? NA b. Can we help you contact them? No 5. Are bowels and bladder emptying properly? Yes a. Are there any unexpected incontinence issues? No b. If applicable, is patient following bowel/bladder programs? (                          ) 6. Any fevers, problems with breathing, unexpected pain? No 7. Are there any skin problems or new areas of breakdown? No 8. Has the patient/family member arranged specialty MD follow up (ie cardiology/neurology/renal/surgical/etc.)?  HFU appointments are scheduled a. Can we help arrange? NA 9. Does the patient need any other services or support that we can help arrange? No 10. Are caregivers following through as expected in assisting the patient? Yes 11. Has the patient quit smoking, drinking alcohol, or using drugs as recommended? (                        )  Appointment date/time 01/04/2019  arrival time 10:15 appointment at 10:30 with Dr. Letta Pate. At [place]

## 2018-12-28 NOTE — Telephone Encounter (Signed)
Noted  

## 2018-12-31 ENCOUNTER — Telehealth: Payer: Self-pay | Admitting: Family

## 2018-12-31 DIAGNOSIS — I480 Paroxysmal atrial fibrillation: Secondary | ICD-10-CM | POA: Diagnosis not present

## 2018-12-31 DIAGNOSIS — E1142 Type 2 diabetes mellitus with diabetic polyneuropathy: Secondary | ICD-10-CM | POA: Diagnosis not present

## 2018-12-31 DIAGNOSIS — I69354 Hemiplegia and hemiparesis following cerebral infarction affecting left non-dominant side: Secondary | ICD-10-CM | POA: Diagnosis not present

## 2018-12-31 DIAGNOSIS — I69351 Hemiplegia and hemiparesis following cerebral infarction affecting right dominant side: Secondary | ICD-10-CM | POA: Diagnosis not present

## 2018-12-31 DIAGNOSIS — I5033 Acute on chronic diastolic (congestive) heart failure: Secondary | ICD-10-CM | POA: Diagnosis not present

## 2018-12-31 DIAGNOSIS — I110 Hypertensive heart disease with heart failure: Secondary | ICD-10-CM | POA: Diagnosis not present

## 2018-12-31 NOTE — Telephone Encounter (Signed)
Darlene from [facility] called stating she sent fax over for nurse orders for patient.   Carlyon Shadow is also going to fax over another order. But is request a call back  Call back [phone]

## 2019-01-03 NOTE — Telephone Encounter (Signed)
Verbal orders given to Darlene at [facility].

## 2019-01-03 NOTE — Telephone Encounter (Signed)
Anda Kraft calling from [facility] would like verbal orders for PT.   1x9

## 2019-01-04 ENCOUNTER — Encounter: Payer: Medicare Other | Admitting: Physical Medicine & Rehabilitation

## 2019-01-06 DIAGNOSIS — I110 Hypertensive heart disease with heart failure: Secondary | ICD-10-CM | POA: Diagnosis not present

## 2019-01-06 DIAGNOSIS — I480 Paroxysmal atrial fibrillation: Secondary | ICD-10-CM | POA: Diagnosis not present

## 2019-01-06 DIAGNOSIS — I69351 Hemiplegia and hemiparesis following cerebral infarction affecting right dominant side: Secondary | ICD-10-CM | POA: Diagnosis not present

## 2019-01-06 DIAGNOSIS — I69354 Hemiplegia and hemiparesis following cerebral infarction affecting left non-dominant side: Secondary | ICD-10-CM | POA: Diagnosis not present

## 2019-01-06 DIAGNOSIS — I5033 Acute on chronic diastolic (congestive) heart failure: Secondary | ICD-10-CM | POA: Diagnosis not present

## 2019-01-06 DIAGNOSIS — E1142 Type 2 diabetes mellitus with diabetic polyneuropathy: Secondary | ICD-10-CM | POA: Diagnosis not present

## 2019-01-07 ENCOUNTER — Ambulatory Visit (INDEPENDENT_AMBULATORY_CARE_PROVIDER_SITE_OTHER): Payer: Medicare Other | Admitting: Family

## 2019-01-07 ENCOUNTER — Encounter: Payer: Self-pay | Admitting: Family

## 2019-01-07 ENCOUNTER — Other Ambulatory Visit: Payer: Self-pay

## 2019-01-07 DIAGNOSIS — I4819 Other persistent atrial fibrillation: Secondary | ICD-10-CM | POA: Diagnosis not present

## 2019-01-07 DIAGNOSIS — I110 Hypertensive heart disease with heart failure: Secondary | ICD-10-CM | POA: Diagnosis not present

## 2019-01-07 DIAGNOSIS — I5032 Chronic diastolic (congestive) heart failure: Secondary | ICD-10-CM

## 2019-01-07 DIAGNOSIS — I5033 Acute on chronic diastolic (congestive) heart failure: Secondary | ICD-10-CM | POA: Diagnosis not present

## 2019-01-07 DIAGNOSIS — D62 Acute posthemorrhagic anemia: Secondary | ICD-10-CM

## 2019-01-07 DIAGNOSIS — E119 Type 2 diabetes mellitus without complications: Secondary | ICD-10-CM

## 2019-01-07 DIAGNOSIS — I69354 Hemiplegia and hemiparesis following cerebral infarction affecting left non-dominant side: Secondary | ICD-10-CM | POA: Diagnosis not present

## 2019-01-07 DIAGNOSIS — I480 Paroxysmal atrial fibrillation: Secondary | ICD-10-CM | POA: Diagnosis not present

## 2019-01-07 DIAGNOSIS — E1142 Type 2 diabetes mellitus with diabetic polyneuropathy: Secondary | ICD-10-CM | POA: Diagnosis not present

## 2019-01-07 DIAGNOSIS — I69351 Hemiplegia and hemiparesis following cerebral infarction affecting right dominant side: Secondary | ICD-10-CM | POA: Diagnosis not present

## 2019-01-07 MED ORDER — ACCU-CHEK GUIDE VI STRP: ORAL_STRIP | 0 refills | Status: DC

## 2019-01-07 NOTE — Progress Notes (Signed)
Virtual Visit via Telephone Note  I connected with [name] on 01/07/19 at 11:20 AM EDT by telephone and verified that I am speaking with the correct person using two identifiers.  Location: Patient: home Provider: work   I discussed the limitations, risks, security and privacy concerns of performing an evaluation and management service by telephone and the availability of in person appointments. I also discussed with the patient that there may be a patient responsible charge related to this service. The patient expressed understanding and agreed to proceed.   History of Present Illness:  Patient is a 73 yr old male who presents today for hospital follow up.  He was admitted 9/23-10/1/20 with AF RVR and decompensated diastolic heart failure.  He underwent cardioversion but he returned to AF. He was diuresed with improvement in his volume status. He was d/c'd home on lasix 69m PO daily prn.  His Ace inhibitor was heled due to low bp.  He also developed a chest wall hematoma during this hospitalization. This was quite extensive  He had associated acute blood loss anemia. Aspirin was held during his hospitalization.  hgb dropped to 6.8. He received 2 U PRBC. He did experience delirium during his visit.  CT head, UA and CXR were negative.  He was d/c'd to inpatient rehab and remained there 10/1-10/9.   Reports that his home sugars are 70-123.    He reports his home weight was  243.8.  Reports that he is using lasix QOD. Denies SOB,  Reports no significant swelling in either leg.  Reports that he still has some chest wall bruising but denies tenderness and states "it is going away."     Wt Readings from Last 3 Encounters:  12/24/18 246 lb 14.6 oz (112 kg)  12/16/18 248 lb 0.3 oz (112.5 kg)  12/01/18 255 lb (115.7 kg)      Past Medical History:  Diagnosis Date  . Acute encephalopathy 12/12/2015  . Acute respiratory failure with hypoxia and hypercapnia ([place] 12/12/2015  . Allergic  rhinitis 02/13/2010   Qualifier: Diagnosis of  By: YWynona Luna  . Angina at rest ([facility] 10/22/2015  . Ataxia 08/10/2013  . Atrial fibrillation ([place] 11/2018  . B12 deficiency 08/11/2013  . BACK PAIN, LUMBAR 01/29/2009   Qualifier: Diagnosis of  By: YWynona Luna  . BENIGN POSITIONAL VERTIGO 11/23/2009   Qualifier: Diagnosis of  By: YWynona Luna  . Chronic diastolic heart failure ([facility] 04/24/2015  . Chronic pain    left sided-[place]  . Coronary 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 ([facility] 11/04/2015   s/p CABG 2004; CFX DES 2011  . Essential hypertension 12/12/2006   Qualifier: Diagnosis of  By: BMarca AnconaRMA, Lucy    . GERD 12/12/2006   Qualifier: Diagnosis of  By: BMarca AnconaRMA, Lucy    . GOUT 12/12/2006   Qualifier: Diagnosis of  By: BReatha Armour Lucy    . Hyperlipidemia LDL goal <70 12/12/2006   Qualifier: Diagnosis of  By: BMarca AnconaRMA, Lucy     . Hypogonadism male 04/10/2011  . Hypothyroidism, acquired 02/15/2013  . Insomnia 05/21/2013  . Long term current use of anticoagulant therapy 06/24/2011  . Obstructive sleep apnea-Failed CPAP 06/17/2007   Failed CPAP Using O 2 for sleep   . Paroxysmal atrial fibrillation ([facility] [place] 12/06/2016  . Stroke (cerebrum) ([facility] 01/13/2017   also 1996; resultant hemiplegia of dominant side  . Thoracic aorta atherosclerosis ([facility] [place] 04/27/2017  .  Tubular adenoma of colon 05/2010  . Type 2 diabetes mellitus without complication, without long-term current use of insulin ([place]) 10/05/2007   Qualifier: Diagnosis of  By: Wynona Luna   . Vasovagal syncope      Social History   Socioeconomic History  . Marital status: Married    Spouse name: [name]  . Number of children: Not on file  . Years of education: 68  . Highest education level: Not on file  Occupational History  . 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 Financial resource strain: Not on file  . Food insecurity    Worry: Not on file     Inability: Not on file  . Transportation needs    Medical: Not on file    Non-medical: Not on file  Tobacco Use  . Smoking status: Never Smoker  . Smokeless tobacco: Never Used  Substance and Sexual Activity  . Alcohol use: No    Alcohol/week: 0.0 standard drinks    Comment: 1 beer a month  . Drug use: No  . Sexual activity: Not Currently  Lifestyle  . Physical activity    Days per week: Not on file    Minutes per session: Not on file  . Stress: Not on file  Relationships  . Social Herbalist on phone: Not on file    Gets together: Not on file    Attends religious service: Not on file    Active member of club or organization: Not on file    Attends meetings of clubs or organizations: Not on file    Relationship status: Not on file  . Intimate partner violence    Fear of current or ex partner: Not on file    Emotionally abused: Not on file    Physically abused: Not on file    Forced sexual activity: Not on file  Other Topics Concern  . Not on file  Social History Narrative   Pt lives with wife. Does have stairs, but patient doesn't use them. Pt has completed technical school    Past Surgical History:  Procedure Laterality Date  . CARDIAC CATHETERIZATION N/A 10/25/2015   Procedure: Left Heart Cath and Cors/Grafts Angiography;  Surgeon: Troy Sine, MD;  Location: [facility] CV LAB;  Service: Cardiovascular;  Laterality: N/A;  . CARDIOVERSION  06/20/2011   Procedure: CARDIOVERSION;  Surgeon: Larey Dresser, MD;  Location: [facility] ENDOSCOPY;  Service: Cardiovascular;  Laterality: N/A;  . CARDIOVERSION N/A 04/26/2015   Procedure: CARDIOVERSION;  Surgeon: Dorothy Spark, MD;  Location: [facility] ENDOSCOPY;  Service: Cardiovascular;  Laterality: N/A;  . CARDIOVERSION N/A 05/10/2015   Procedure: CARDIOVERSION;  Surgeon: Larey Dresser, MD;  Location: [facility];  Service: Cardiovascular;  Laterality: N/A;  . CARDIOVERSION N/A 12/10/2018   Procedure: CARDIOVERSION;   Surgeon: Sanda Klein, MD;  Location: [facility];  Service: Cardiovascular;  Laterality: N/A;  . CATARACT EXTRACTION  05/2010   left eye  . CATARACT EXTRACTION  04/2010   right eye  . CORONARY ARTERY BYPASS GRAFT     stent  . ESOPHAGOGASTRODUODENOSCOPY  03-25-2005  . LEFT HEART CATH AND CORS/GRAFTS ANGIOGRAPHY N/A 02/19/2018   Procedure: LEFT HEART CATH AND CORS/GRAFTS ANGIOGRAPHY;  Surgeon: Martinique, Peter M, MD;  Location: [facility] [place] CV LAB;  Service: Cardiovascular;  Laterality: N/A;  . NASAL SEPTUM SURGERY    . PERCUTANEOUS PLACEMENT INTRAVASCULAR STENT CERVICAL CAROTID ARTERY  03-2009; using a drug-eluting platform of the circumflex cornoray artery with a 3.0 x 18 [employer] Promus drug-eluting platform post dilated to 3.75 with a noncompliant balloon.  . TEE WITHOUT CARDIOVERSION  06/20/2011   Procedure: TRANSESOPHAGEAL ECHOCARDIOGRAM (TEE);  Surgeon: Larey Dresser, MD;  Location: [facility] ENDOSCOPY;  Service: Cardiovascular;  Laterality: N/A;    Family History  Problem Relation Age of Onset  . Lung cancer Father        deceased  . Stroke Mother        deceased-MINISTROKES    No Known Allergies  Current Outpatient Medications on File Prior to Visit  Medication Sig Dispense Refill  . ACCU-CHEK GUIDE test strip USE UP TO FOUR TIMES DAILY AS DIRECTED 100 strip 0  . acetaminophen (TYLENOL) 325 MG tablet Take 2 tablets (650 mg total) by mouth every 4 (four) hours as needed for headache or mild pain.    Marland Kitchen allopurinol (ZYLOPRIM) 100 MG tablet Take 1 tablet (100 mg total) by mouth 2 (two) times daily. 60 tablet 0  . atorvastatin (LIPITOR) 40 MG tablet Take 1 tablet (40 mg total) by mouth daily. 90 tablet 3  . blood glucose meter kit and supplies KIT Dispense based on patient and insurance preference. Use up to four times daily as directed. (FOR ICD-9 250.00, 250.01). 1 each 0  . diltiazem (CARDIZEM CD) 240 MG 24 hr capsule Take 1 capsule (240 mg total) by mouth 2 (two) times daily.  60 capsule 1  . ferrous sulfate 325 (65 FE) MG tablet Take 1 tablet (325 mg total) by mouth 2 (two) times daily with a meal. 60 tablet 3  . fluticasone (FLONASE) 50 MCG/ACT nasal spray Place 2 sprays into both nostrils daily. (Patient taking differently: Place 2 sprays into both nostrils daily as needed for allergies. ) 9.9 g 1  . folic acid (FOLVITE) 1 MG tablet Take 1 tablet (1 mg total) by mouth daily. 30 tablet 0  . furosemide (LASIX) 40 MG tablet Take 1 tablet (40 mg total) by mouth daily as needed for edema (edema and weight gain). 45 tablet 1  . gabapentin (NEURONTIN) 100 MG capsule Take 1 capsule (100 mg total) by mouth at bedtime. 30 capsule 1  . levothyroxine (SYNTHROID) 112 MCG tablet Take 1 tablet (112 mcg total) by mouth daily before breakfast. 90 tablet 1  . metFORMIN (GLUCOPHAGE) 500 MG tablet Take 1 tablet (500 mg total) by mouth daily with breakfast. 90 tablet 1  . Multiple Vitamins-Minerals (MULTIVITAMIN GUMMIES MENS PO) Take 2 tablets by mouth daily with breakfast.     . nitroGLYCERIN (NITROSTAT) 0.4 MG SL tablet Place 1 tablet (0.4 mg total) under the tongue every 5 (five) minutes as needed for chest pain. Up to 3 doses 10 tablet 2  . OXYGEN Inhale 2 L into the lungs at bedtime.    . pantoprazole (PROTONIX) 40 MG tablet Take 1 tablet (40 mg total) by mouth every morning. 90 tablet 1  . senna-docusate (SENOKOT-S) 8.6-50 MG tablet Take 2 tablets by mouth 2 (two) times daily.    Marland Kitchen tiZANidine (ZANAFLEX) 2 MG tablet Take 1 tablet (2 mg total) by mouth at bedtime. 30 tablet 0  . traMADol (ULTRAM) 50 MG tablet Take 1 tablet (50 mg total) by mouth 2 (two) times daily. 60 tablet 0  . traMADol (ULTRAM-ER) 200 MG 24 hr tablet Take 1 tablet (200 mg total) by mouth daily. 30 tablet 0  . vitamin B-12 1000 MCG tablet Take 1  tablet (1,000 mcg total) by mouth daily. (Patient taking differently: Take 2,000 mcg by mouth daily. ) 30 tablet 0   No current facility-administered medications on file  prior to visit.     There were no vitals taken for this visit.   Observations/Objective:   Gen: Awake, alert Resp: Breathing sounds even and non-labored Psych: calm/pleasant demeanor Neuro: Alert and Oriented x 3,  speech is clear.    Assessment and Plan:  Diastolic heart failure- weight stable on QOD lasix dosing.    DM2- stable, continue current meds.  Lab Results  Component Value Date   HGBA1C 6.9 (H) 11/28/2018   AF-  Clinically compensated.  No longer on warfarin. He remains off of aspirin, will defer to cardiology if they wish to restart aspirin.  Acute blood loss anemia- reports hematoma is resolving. Last cbc was stable/improved at  at 10.6.   Follow Up Instructions:    I discussed the assessment and treatment plan with the patient. The patient was provided an opportunity to ask questions and all were answered. The patient agreed with the plan and demonstrated an understanding of the instructions.   The patient was advised to call back or seek an in-person evaluation if the symptoms worsen or if the condition fails to improve as anticipated.  I provided 11 minutes of non-face-to-face time during this encounter.   [name], NP

## 2019-01-11 ENCOUNTER — Other Ambulatory Visit: Payer: Self-pay

## 2019-01-11 ENCOUNTER — Encounter: Payer: Self-pay | Admitting: Physical Medicine & Rehabilitation

## 2019-01-11 ENCOUNTER — Other Ambulatory Visit: Payer: Self-pay | Admitting: Family

## 2019-01-11 ENCOUNTER — Encounter: Payer: Medicare Other | Attending: Physical Medicine & Rehabilitation | Admitting: Physical Medicine & Rehabilitation

## 2019-01-11 VITALS — BP 145/83 | HR 92 | Temp 98.1°F | Ht 75.0 in | Wt 238.0 lb

## 2019-01-11 DIAGNOSIS — G8111 Spastic hemiplegia affecting right dominant side: Secondary | ICD-10-CM | POA: Insufficient documentation

## 2019-01-11 DIAGNOSIS — G8112 Spastic hemiplegia affecting left dominant side: Secondary | ICD-10-CM

## 2019-01-11 DIAGNOSIS — M533 Sacrococcygeal disorders, not elsewhere classified: Secondary | ICD-10-CM | POA: Diagnosis not present

## 2019-01-11 NOTE — Progress Notes (Signed)
Subjective:    Patient ID: [name], male    DOB: April 14, 1945, 73 y.o.   MRN: [id number] 73 y.o. right-handed male with history of congestive heart failure, hypertension, CAD with CABG 2004, CVA with left side residual weakness, diabetes mellitus, obstructive sleep apnea, atrial fibrillation with chronic Coumadin.  Per chart review lives with spouse 1 level home.  Reportedly independent prior to admission.  Presented 12/09/2018 with recurrent atrial fibrillation shortness of breath altered mental status and attempts made cardioversion unsuccessful placed on intravenous Cardizem drip.  INR on admission of 2.0.  Echocardiogram did not reveal any ischemic changes.  Patient remained on IV heparin as well as Coumadin.  Rapid response 12/12/2018 for mild disorientation and dysarthria as well as large hematoma left lateral chest.  No trauma noted.  Suspect spontaneous chest wall hematoma per cardiology services and heparin and Coumadin discontinued.  IMA globin dipped to 6.8 he was transfused 2 units of packed red blood cells.  Cranial CT scan 12/14/2018 due to altered mental status showed no acute change as well as ABGs unremarkable.  Patient would remain off anticoagulation at this time consideration for a watchman device and anticoagulation could be readdressed as an outpatient.   HPI  Pt now off blood thinner.   Has lost ~10lb HHPT, OT Dressing and bathing except needs sup bfor shower transfers  No falls  No other problems at home  Pain is doing ok.    Hospital followup after inpt rehab stay  Pain Inventory Average Pain 7 Pain Right Now 7 My pain is dull  In the last 24 hours, has pain interfered with the following? General activity 0 Relation with others 6 Enjoyment of life 4 What TIME of day is your pain at its worst? morning, evening  Sleep (in general) Good  Pain is worse with: walking and standing Pain improves with: medication and TENS Relief from Meds: 8  Mobility walk  with assistance use a walker ability to climb steps?  no do you drive?  no  Function retired I need assistance with the following:  dressing and shopping  Neuro/Psych tingling trouble walking confusion depression  Prior Studies transitional  Physicians involved in your care transitional   Family History  Problem Relation Age of Onset  . Lung cancer Father        deceased  . Stroke Mother        deceased-MINISTROKES   Social History   Socioeconomic History  . Marital status: Married    Spouse name: [name]  . Number of children: Not on file  . Years of education: 63  . Highest education level: Not on file  Occupational History  . 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 Financial resource strain: Not on file  . Food insecurity    Worry: Not on file    Inability: Not on file  . Transportation needs    Medical: Not on file    Non-medical: Not on file  Tobacco Use  . Smoking status: Never Smoker  . Smokeless tobacco: Never Used  Substance and Sexual Activity  . Alcohol use: No    Alcohol/week: 0.0 standard drinks    Comment: 1 beer a month  . Drug use: No  . Sexual activity: Not Currently  Lifestyle  . Physical activity    Days per week: Not on file    Minutes per session: Not on file  . Stress: Not on  file  Relationships  . Social Herbalist on phone: Not on file    Gets together: Not on file    Attends religious service: Not on file    Active member of club or organization: Not on file    Attends meetings of clubs or organizations: Not on file    Relationship status: Not on file  Other Topics Concern  . Not on file  Social History Narrative   Pt lives with wife. Does have stairs, but patient doesn't use them. Pt has completed technical school   Past Surgical History:  Procedure Laterality Date  . CARDIAC CATHETERIZATION N/A 10/25/2015   Procedure: Left Heart Cath and Cors/Grafts  Angiography;  Surgeon: Troy Sine, MD;  Location: [place] [facility];  Service: Cardiovascular;  Laterality: N/A;  . CARDIOVERSION  06/20/2011   Procedure: CARDIOVERSION;  Surgeon: Larey Dresser, MD;  Location: [facility] ENDOSCOPY;  Service: Cardiovascular;  Laterality: N/A;  . CARDIOVERSION N/A 04/26/2015   Procedure: CARDIOVERSION;  Surgeon: Dorothy Spark, MD;  Location: [facility] ENDOSCOPY;  Service: Cardiovascular;  Laterality: N/A;  . CARDIOVERSION N/A 05/10/2015   Procedure: CARDIOVERSION;  Surgeon: Larey Dresser, MD;  Location: [place];  Service: Cardiovascular;  Laterality: N/A;  . CARDIOVERSION N/A 12/10/2018   Procedure: CARDIOVERSION;  Surgeon: Sanda Klein, MD;  Location: [place];  Service: Cardiovascular;  Laterality: N/A;  . CATARACT EXTRACTION  05/2010   left eye  . CATARACT EXTRACTION  04/2010   right eye  . CORONARY ARTERY BYPASS GRAFT     stent  . ESOPHAGOGASTRODUODENOSCOPY  03-25-2005  . LEFT HEART CATH AND CORS/GRAFTS ANGIOGRAPHY N/A 02/19/2018   Procedure: LEFT HEART CATH AND CORS/GRAFTS ANGIOGRAPHY;  Surgeon: Martinique, Peter M, MD;  Location: [place] [facility];  Service: Cardiovascular;  Laterality: N/A;  . NASAL SEPTUM SURGERY    . PERCUTANEOUS PLACEMENT INTRAVASCULAR STENT CERVICAL CAROTID ARTERY     03-2009; using a drug-eluting platform of the circumflex cornoray artery with a 3.0 x 18 [employer] Promus drug-eluting platform post dilated to 3.75 with a noncompliant balloon.  . TEE WITHOUT CARDIOVERSION  06/20/2011   Procedure: TRANSESOPHAGEAL ECHOCARDIOGRAM (TEE);  Surgeon: Larey Dresser, MD;  Location: [place];  Service: Cardiovascular;  Laterality: N/A;   Past Medical History:  Diagnosis Date  . Acute encephalopathy 12/12/2015  . Acute respiratory failure with hypoxia and hypercapnia ([place]) 12/12/2015  . Allergic rhinitis 02/13/2010   Qualifier: Diagnosis of  By: Wynona Luna   . Angina at rest [facility]) 10/22/2015  . Ataxia 08/10/2013  . Atrial  fibrillation ([place]) 11/2018  . B12 deficiency 08/11/2013  . BACK PAIN, LUMBAR 01/29/2009   Qualifier: Diagnosis of  By: Wynona Luna   . BENIGN POSITIONAL VERTIGO 11/23/2009   Qualifier: Diagnosis of  By: Wynona Luna   . Chronic diastolic heart failure ([place]) 04/24/2015  . Chronic pain    left sided-[place]  . Coronary 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 ([place]) 11/04/2015   s/p CABG 2004; CFX DES 2011  . Essential hypertension 12/12/2006   Qualifier: Diagnosis of  By: [name] RMA, Lucy    . GERD 12/12/2006   Qualifier: Diagnosis of  By: [name] RMA, Lucy    . GOUT 12/12/2006   Qualifier: Diagnosis of  By: Reatha Armour, Lucy    . Hyperlipidemia LDL goal <70 12/12/2006   Qualifier: Diagnosis of  By: [name] RMA, Lucy     . Hypogonadism  male 04/10/2011  . Hypothyroidism, acquired 02/15/2013  . Insomnia 05/21/2013  . Long term current use of anticoagulant therapy 06/24/2011  . Obstructive sleep apnea-Failed CPAP 06/17/2007   Failed CPAP Using O 2 for sleep   . Paroxysmal atrial fibrillation ([place]) 12/06/2016  . Stroke (cerebrum) ([place]) 01/13/2017   also 1996; resultant hemiplegia of dominant side  . Thoracic aorta atherosclerosis ([place]) 04/27/2017  . Tubular adenoma of colon 05/2010  . Type 2 diabetes mellitus without complication, without long-term current use of insulin ([place]) 10/05/2007   Qualifier: Diagnosis of  By: Wynona Luna   . Vasovagal syncope    BP (!) 145/83   Pulse 92   Temp 98.1 F (36.7 C)   Ht 6\' 3"  (1.905 m)   Wt 238 lb (108 kg)   SpO2 95%   BMI 29.75 kg/m   Opioid Risk Score:   Fall Risk Score:  `1  Depression screen PHQ 2/9  Depression screen [facility] 2/9 11/10/2018 07/28/2017 07/13/2017 05/21/2016  Decreased Interest 0 0 0 0  Down, Depressed, Hopeless 0 0 0 0  PHQ - 2 Score 0 0 0 0  Altered sleeping - - - 1  Tired, decreased energy - - - 1  Change in appetite - - - 0  Feeling bad or failure about yourself  - - - 0  Trouble concentrating - - - 0   Moving slowly or fidgety/restless - - - 0  Suicidal thoughts - - - 0  PHQ-9 Score - - - 2  Some recent data might be hidden    Review of Systems  Constitutional: Negative.   HENT: Negative.   Eyes: Negative.   Respiratory: Negative.   Cardiovascular: Negative.   Gastrointestinal: Negative.   Endocrine: Negative.   Genitourinary: Negative.   Musculoskeletal: Positive for gait problem.  Skin: Negative.   Allergic/Immunologic: Negative.   Neurological:       Tingling  Psychiatric/Behavioral: Positive for confusion and dysphoric mood.  All other systems reviewed and are negative.      Objective:   Physical Exam Vitals signs and nursing note reviewed.  Constitutional:      Appearance: Normal appearance.  Eyes:     Extraocular Movements: Extraocular movements intact.     Conjunctiva/sclera: Conjunctivae normal.     Pupils: Pupils are equal, round, and reactive to light.  Neurological:     Mental Status: He is alert and oriented to person, place, and time.     Comments: 4- Left delt, bi, tri, grip, HF, KE 3- ADF  Sensation intact to LT on Left side  Sit to stand with Mod I  Speech without dysarthria  Tone MAS 2 in BIcepts and finger flexors MAS 3 in Left hamstrings  Psychiatric:        Mood and Affect: Mood normal.        Behavior: Behavior normal.           Assessment & Plan:  1.  Left spastic hemiplegia, spasticity interferes with mobility and ADL.  Last botox in June , missed procedure due to hospitalization .  Will schedule for repeat , same dosing and muscle group selection    left FDP 25 units  left FDS 25 units left biceps 50 units Hamstrings200  2.  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

## 2019-01-11 NOTE — Telephone Encounter (Signed)
rx refill furosemide (LASIX) 40 MG tablet    PHARMACY  [id number] [address], [place] - [place]. [phone] (Phone) [phone] (Fax)

## 2019-01-11 NOTE — Progress Notes (Signed)
Met with patients daughter per her request.  She expressed that he has been increasingly difficult in taking care of at home and that he has not been himself lately.  Stated he has tried to drive vehicle multiple times since discharge from hospital, and has also been very depressed due to his consistent limitations.  Has asked if there is any way she can receive help at home, granddaughters has helped maintain his blood sugar and diet but she still feels overwhelmed at times.

## 2019-01-12 ENCOUNTER — Telehealth: Payer: Self-pay

## 2019-01-12 DIAGNOSIS — I69354 Hemiplegia and hemiparesis following cerebral infarction affecting left non-dominant side: Secondary | ICD-10-CM | POA: Diagnosis not present

## 2019-01-12 DIAGNOSIS — I110 Hypertensive heart disease with heart failure: Secondary | ICD-10-CM | POA: Diagnosis not present

## 2019-01-12 DIAGNOSIS — H903 Sensorineural hearing loss, bilateral: Secondary | ICD-10-CM

## 2019-01-12 DIAGNOSIS — I69351 Hemiplegia and hemiparesis following cerebral infarction affecting right dominant side: Secondary | ICD-10-CM | POA: Diagnosis not present

## 2019-01-12 DIAGNOSIS — H905 Unspecified sensorineural hearing loss: Secondary | ICD-10-CM

## 2019-01-12 DIAGNOSIS — I480 Paroxysmal atrial fibrillation: Secondary | ICD-10-CM | POA: Diagnosis not present

## 2019-01-12 DIAGNOSIS — E1142 Type 2 diabetes mellitus with diabetic polyneuropathy: Secondary | ICD-10-CM | POA: Diagnosis not present

## 2019-01-12 DIAGNOSIS — I5033 Acute on chronic diastolic (congestive) heart failure: Secondary | ICD-10-CM | POA: Diagnosis not present

## 2019-01-12 MED ORDER — BLOOD GLUCOSE MONITOR KIT: PACK | 0 refills | Status: DC

## 2019-01-12 MED ORDER — FUROSEMIDE 40 MG PO TABS: 40.0000 mg | ORAL_TABLET | Freq: Every day | ORAL | 1 refills | Status: DC | PRN

## 2019-01-12 NOTE — Telephone Encounter (Signed)
Copied from [place] [id number]. Topic: Referral - Request for Referral >> Jan 11, 2019 12:32 PM [name] wrote: Patient requesting call to discuss potential [facility] on [address]. >> Jan 11, 2019  4:52 PM Mathis Bud wrote: Patient is requesting a call back regarding his hearing aids. Call back [phone]

## 2019-01-12 NOTE — Telephone Encounter (Signed)
I believe that this needs to be written by an audiologist so that they have all the information they need to provide him the right hearing aids. Also, insurance typically does not cover the cost of hearing aids. I am told [facility] has some of the best prices.

## 2019-01-12 NOTE — Telephone Encounter (Signed)
Patient will like to get a prescription for hearing aids so he can take it to [facility] in [place].

## 2019-01-12 NOTE — Telephone Encounter (Signed)
Refill sent to pharmacy.   

## 2019-01-12 NOTE — Telephone Encounter (Signed)
Information given to patient he will call audiologist for a prescription

## 2019-01-13 ENCOUNTER — Other Ambulatory Visit: Payer: Self-pay

## 2019-01-13 DIAGNOSIS — H903 Sensorineural hearing loss, bilateral: Secondary | ICD-10-CM | POA: Diagnosis not present

## 2019-01-13 NOTE — Addendum Note (Signed)
Addended by: Jiles Prows on: 01/13/2019 03:37 PM   Modules accepted: Orders

## 2019-01-13 NOTE — Addendum Note (Signed)
Addended by: Jiles Prows on: 01/13/2019 12:17 PM   Modules accepted: Orders

## 2019-01-13 NOTE — Telephone Encounter (Signed)
Please place audiology referral, dx hearing loss.

## 2019-01-13 NOTE — Telephone Encounter (Signed)
[name] with [facility] calling , the pt is there to get his hearing tested. Pt needs referral in order for Medicare to pay for his appointment. They are going to test him today, and need a referral by tomorrow that is BACKDATED for today's date.. Please submit as: [facility] Dx: sensory neural hearing loss  Fax:  [id number]  cb  [phone]

## 2019-01-13 NOTE — Telephone Encounter (Signed)
Referral sent over for patient.

## 2019-01-14 DIAGNOSIS — I110 Hypertensive heart disease with heart failure: Secondary | ICD-10-CM | POA: Diagnosis not present

## 2019-01-14 DIAGNOSIS — I480 Paroxysmal atrial fibrillation: Secondary | ICD-10-CM | POA: Diagnosis not present

## 2019-01-14 DIAGNOSIS — I5033 Acute on chronic diastolic (congestive) heart failure: Secondary | ICD-10-CM | POA: Diagnosis not present

## 2019-01-14 DIAGNOSIS — E1142 Type 2 diabetes mellitus with diabetic polyneuropathy: Secondary | ICD-10-CM | POA: Diagnosis not present

## 2019-01-14 DIAGNOSIS — I69351 Hemiplegia and hemiparesis following cerebral infarction affecting right dominant side: Secondary | ICD-10-CM | POA: Diagnosis not present

## 2019-01-14 DIAGNOSIS — I69354 Hemiplegia and hemiparesis following cerebral infarction affecting left non-dominant side: Secondary | ICD-10-CM | POA: Diagnosis not present

## 2019-01-19 ENCOUNTER — Other Ambulatory Visit: Payer: Self-pay | Admitting: Family

## 2019-01-19 DIAGNOSIS — E1142 Type 2 diabetes mellitus with diabetic polyneuropathy: Secondary | ICD-10-CM | POA: Diagnosis not present

## 2019-01-19 DIAGNOSIS — I480 Paroxysmal atrial fibrillation: Secondary | ICD-10-CM | POA: Diagnosis not present

## 2019-01-19 DIAGNOSIS — I69351 Hemiplegia and hemiparesis following cerebral infarction affecting right dominant side: Secondary | ICD-10-CM | POA: Diagnosis not present

## 2019-01-19 DIAGNOSIS — I110 Hypertensive heart disease with heart failure: Secondary | ICD-10-CM | POA: Diagnosis not present

## 2019-01-19 DIAGNOSIS — I69354 Hemiplegia and hemiparesis following cerebral infarction affecting left non-dominant side: Secondary | ICD-10-CM | POA: Diagnosis not present

## 2019-01-19 DIAGNOSIS — I5033 Acute on chronic diastolic (congestive) heart failure: Secondary | ICD-10-CM | POA: Diagnosis not present

## 2019-01-25 DIAGNOSIS — E1142 Type 2 diabetes mellitus with diabetic polyneuropathy: Secondary | ICD-10-CM | POA: Diagnosis not present

## 2019-01-25 DIAGNOSIS — I110 Hypertensive heart disease with heart failure: Secondary | ICD-10-CM | POA: Diagnosis not present

## 2019-01-25 DIAGNOSIS — I69354 Hemiplegia and hemiparesis following cerebral infarction affecting left non-dominant side: Secondary | ICD-10-CM | POA: Diagnosis not present

## 2019-01-25 DIAGNOSIS — I69351 Hemiplegia and hemiparesis following cerebral infarction affecting right dominant side: Secondary | ICD-10-CM | POA: Diagnosis not present

## 2019-01-25 DIAGNOSIS — I5033 Acute on chronic diastolic (congestive) heart failure: Secondary | ICD-10-CM | POA: Diagnosis not present

## 2019-01-25 DIAGNOSIS — I480 Paroxysmal atrial fibrillation: Secondary | ICD-10-CM | POA: Diagnosis not present

## 2019-01-26 ENCOUNTER — Other Ambulatory Visit: Payer: Self-pay

## 2019-01-26 ENCOUNTER — Encounter: Payer: Self-pay | Admitting: Cardiology

## 2019-01-26 ENCOUNTER — Ambulatory Visit (INDEPENDENT_AMBULATORY_CARE_PROVIDER_SITE_OTHER): Payer: Medicare Other | Admitting: Cardiology

## 2019-01-26 VITALS — BP 124/78 | HR 61 | Ht 75.0 in | Wt 234.4 lb

## 2019-01-26 DIAGNOSIS — D649 Anemia, unspecified: Secondary | ICD-10-CM

## 2019-01-26 DIAGNOSIS — I480 Paroxysmal atrial fibrillation: Secondary | ICD-10-CM | POA: Diagnosis not present

## 2019-01-26 DIAGNOSIS — E785 Hyperlipidemia, unspecified: Secondary | ICD-10-CM

## 2019-01-26 DIAGNOSIS — Z9981 Dependence on supplemental oxygen: Secondary | ICD-10-CM | POA: Diagnosis not present

## 2019-01-26 DIAGNOSIS — I5033 Acute on chronic diastolic (congestive) heart failure: Secondary | ICD-10-CM | POA: Diagnosis not present

## 2019-01-26 DIAGNOSIS — I10 Essential (primary) hypertension: Secondary | ICD-10-CM | POA: Diagnosis not present

## 2019-01-26 DIAGNOSIS — I6930 Unspecified sequelae of cerebral infarction: Secondary | ICD-10-CM | POA: Diagnosis not present

## 2019-01-26 DIAGNOSIS — M109 Gout, unspecified: Secondary | ICD-10-CM | POA: Diagnosis not present

## 2019-01-26 DIAGNOSIS — I25810 Atherosclerosis of coronary artery bypass graft(s) without angina pectoris: Secondary | ICD-10-CM | POA: Diagnosis not present

## 2019-01-26 DIAGNOSIS — E1142 Type 2 diabetes mellitus with diabetic polyneuropathy: Secondary | ICD-10-CM | POA: Diagnosis not present

## 2019-01-26 DIAGNOSIS — Z9861 Coronary angioplasty status: Secondary | ICD-10-CM

## 2019-01-26 DIAGNOSIS — I2510 Atherosclerotic heart disease of native coronary artery without angina pectoris: Secondary | ICD-10-CM | POA: Diagnosis not present

## 2019-01-26 DIAGNOSIS — E118 Type 2 diabetes mellitus with unspecified complications: Secondary | ICD-10-CM

## 2019-01-26 DIAGNOSIS — I110 Hypertensive heart disease with heart failure: Secondary | ICD-10-CM | POA: Diagnosis not present

## 2019-01-26 DIAGNOSIS — N289 Disorder of kidney and ureter, unspecified: Secondary | ICD-10-CM | POA: Diagnosis not present

## 2019-01-26 DIAGNOSIS — E039 Hypothyroidism, unspecified: Secondary | ICD-10-CM | POA: Diagnosis not present

## 2019-01-26 DIAGNOSIS — I4819 Other persistent atrial fibrillation: Secondary | ICD-10-CM | POA: Diagnosis not present

## 2019-01-26 DIAGNOSIS — G4733 Obstructive sleep apnea (adult) (pediatric): Secondary | ICD-10-CM | POA: Diagnosis not present

## 2019-01-26 DIAGNOSIS — I69351 Hemiplegia and hemiparesis following cerebral infarction affecting right dominant side: Secondary | ICD-10-CM | POA: Diagnosis not present

## 2019-01-26 DIAGNOSIS — E538 Deficiency of other specified B group vitamins: Secondary | ICD-10-CM | POA: Diagnosis not present

## 2019-01-26 DIAGNOSIS — Z951 Presence of aortocoronary bypass graft: Secondary | ICD-10-CM | POA: Diagnosis not present

## 2019-01-26 DIAGNOSIS — I69354 Hemiplegia and hemiparesis following cerebral infarction affecting left non-dominant side: Secondary | ICD-10-CM | POA: Diagnosis not present

## 2019-01-26 DIAGNOSIS — Z9181 History of falling: Secondary | ICD-10-CM | POA: Diagnosis not present

## 2019-01-26 DIAGNOSIS — Z7984 Long term (current) use of oral hypoglycemic drugs: Secondary | ICD-10-CM | POA: Diagnosis not present

## 2019-01-26 MED ORDER — BLOOD GLUCOSE MONITOR KIT: PACK | 0 refills | Status: DC

## 2019-01-26 NOTE — Progress Notes (Signed)
Cardiology Office Note   Date:  01/27/2019   ID:  [name], DOB [birth year], MRN [id number]  PCP:  Debbrah Alar, NP  Cardiologist:  Dr. Marlou Porch    Chief Complaint  Patient presents with  . Hospitalization Follow-up      History of Present Illness: Cadence Haslam is a 73 y.o. male who presents for post hospitalization.for chest pain.   He has a hx of CAD s/p CABG in 2004, DES to LCx in 2011, and known occlusion of VG to terminal LCX, but patent LIMA to LAD, and patent VG to 1st diag and VG to 1st om per cath 02/2018,  previously paroxysmal afib previously on coumadin see below, hypertension, hyperlipidemia, diabetes, diastolic heart failure, previous CVA with residual left sided defecit and OSA on nighttime O2, and recent admission for afib rvr/acute heart failure complicated by spontaneous chest hematoma, discharged to inpatient rehab    With admit in A. fib with RVR and heart failure 11/27/18 he was diuresed and rate controlled with plan for DCCV in future discharged 11/29/18.  Readmitted 12/09/18 with SOB and edema.  Was diuresed and ultimately cardioverted but then went back into A. fib later same day.  Amio was added at that time and pt developed spontaneous Lt lateral chest bleed and on CT chest wall hematoma.  Drop hgb and given protamine.  Pt was on coumadin with INR 1.7, heparin and amiodarone.  Anticoagulation held and plan for rate control alone.  Amiodarone stopped.     ( please note has been on amio previously but stopped because of dyspnea and concern for pulm. Toxicity --DLCO was low at 55 (9/18)  But CT >>NO interstitial lung disease-- pulmonologist is [place])  At discharge thoughts to possibly  a candidate for a watchman or other left atrial appendage closure devices, but this can occur in the outpatient setting at a much later date after rehabilitation  Now permanent a fib.    Eventually he was sent to [facility] inpatient rehab.  Since in rehab he has been  complaining of 7 out of 10 centralized chest pain which is sharp, quick onset and offset that occurs mostly positionally. He was seen in Rehab and it was not felt to be cardiac.  His troponin neg.    Today pt is feeling better and frustrated.  He wants to drive.  We discussed he needed more time to recover.  He feels bad in the atrial fib.  Very weak and he does not believe it is due to his recent illness.  He believes his HR can be slow.  He has had fatigue with a fib in the past.  He also is frustrated that we could not open his occluded vein graft or native RCA. Discussed at length and pt has normal EF.   His number one goal other than driving is to be back in normal rhythm.     No further chest pain and no SOB more fatigue than anything.  No bleeding.      Past Medical History:  Diagnosis Date  . Acute encephalopathy 12/12/2015  . Acute respiratory failure with hypoxia and hypercapnia ([place]) 12/12/2015  . Allergic rhinitis 02/13/2010   Qualifier: Diagnosis of  By: Wynona Luna   . Angina at rest [facility]) 10/22/2015  . Ataxia 08/10/2013  . Atrial fibrillation ([place]) 11/2018  . B12 deficiency 08/11/2013  . BACK PAIN, LUMBAR 01/29/2009   Qualifier: Diagnosis of  By: Wynona Luna   .  BENIGN POSITIONAL VERTIGO 11/23/2009   Qualifier: Diagnosis of  By: Wynona Luna   . Chronic diastolic heart failure ([place]) 04/24/2015  . Chronic pain    left sided-[place]  . Coronary 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 ([place]) 11/04/2015   s/p CABG 2004; CFX DES 2011  . Essential hypertension 12/12/2006   Qualifier: Diagnosis of  By: [name] RMA, Lucy    . GERD 12/12/2006   Qualifier: Diagnosis of  By: [name] RMA, Lucy    . GOUT 12/12/2006   Qualifier: Diagnosis of  By: Reatha Armour, Lucy    . Hyperlipidemia LDL goal <70 12/12/2006   Qualifier: Diagnosis of  By: [name] RMA, Lucy     . Hypogonadism male 04/10/2011  . Hypothyroidism, acquired 02/15/2013  . Insomnia 05/21/2013  . Long term  current use of anticoagulant therapy 06/24/2011  . Obstructive sleep apnea-Failed CPAP 06/17/2007   Failed CPAP Using O 2 for sleep   . Paroxysmal atrial fibrillation ([place]) 12/06/2016  . Stroke (cerebrum) ([place]) 01/13/2017   also 1996; resultant hemiplegia of dominant side  . Thoracic aorta atherosclerosis ([place]) 04/27/2017  . Tubular adenoma of colon 05/2010  . Type 2 diabetes mellitus without complication, without long-term current use of insulin ([place]) 10/05/2007   Qualifier: Diagnosis of  By: Wynona Luna   . Vasovagal syncope     Past Surgical History:  Procedure Laterality Date  . CARDIAC CATHETERIZATION N/A 10/25/2015   Procedure: Left Heart Cath and Cors/Grafts Angiography;  Surgeon: Troy Sine, MD;  Location: [place] CV LAB;  Service: Cardiovascular;  Laterality: N/A;  . CARDIOVERSION  06/20/2011   Procedure: CARDIOVERSION;  Surgeon: Larey Dresser, MD;  Location: [facility] ENDOSCOPY;  Service: Cardiovascular;  Laterality: N/A;  . CARDIOVERSION N/A 04/26/2015   Procedure: CARDIOVERSION;  Surgeon: Dorothy Spark, MD;  Location: [facility] ENDOSCOPY;  Service: Cardiovascular;  Laterality: N/A;  . CARDIOVERSION N/A 05/10/2015   Procedure: CARDIOVERSION;  Surgeon: Larey Dresser, MD;  Location: [place];  Service: Cardiovascular;  Laterality: N/A;  . CARDIOVERSION N/A 12/10/2018   Procedure: CARDIOVERSION;  Surgeon: Sanda Klein, MD;  Location: [place];  Service: Cardiovascular;  Laterality: N/A;  . CATARACT EXTRACTION  05/2010   left eye  . CATARACT EXTRACTION  04/2010   right eye  . CORONARY ARTERY BYPASS GRAFT     stent  . ESOPHAGOGASTRODUODENOSCOPY  03-25-2005  . LEFT HEART CATH AND CORS/GRAFTS ANGIOGRAPHY N/A 02/19/2018   Procedure: LEFT HEART CATH AND CORS/GRAFTS ANGIOGRAPHY;  Surgeon: Martinique, Peter M, MD;  Location: [place] CV LAB;  Service: Cardiovascular;  Laterality: N/A;  . NASAL SEPTUM SURGERY    . PERCUTANEOUS PLACEMENT INTRAVASCULAR STENT CERVICAL CAROTID ARTERY      03-2009; using a drug-eluting platform of the circumflex cornoray artery with a 3.0 x 18 [employer] Promus drug-eluting platform post dilated to 3.75 with a noncompliant balloon.  . TEE WITHOUT CARDIOVERSION  06/20/2011   Procedure: TRANSESOPHAGEAL ECHOCARDIOGRAM (TEE);  Surgeon: Larey Dresser, MD;  Location: [facility] ENDOSCOPY;  Service: Cardiovascular;  Laterality: N/A;     Current Outpatient Medications  Medication Sig Dispense Refill  . acetaminophen (TYLENOL) 325 MG tablet Take 2 tablets (650 mg total) by mouth every 4 (four) hours as needed for headache or mild pain.    Marland Kitchen allopurinol (ZYLOPRIM) 100 MG tablet Take 1 tablet (100 mg total) by mouth 2 (two) times daily. 60 tablet 0  . atorvastatin (LIPITOR) 40 MG tablet Take 1 tablet (  40 mg total) by mouth daily. 90 tablet 3  . benazepril (LOTENSIN) 10 MG tablet Take 10 mg by mouth daily.    . blood glucose meter kit and supplies KIT Dispense based on patient and insurance preference. Use up to four times daily as directed. (FOR ICD-9 250.00, 250.01). 102 each 0  . cyanocobalamin 1000 MCG tablet Take 2,000 mcg by mouth daily.    Marland Kitchen diltiazem (CARDIZEM CD) 240 MG 24 hr capsule Take 1 capsule (240 mg total) by mouth 2 (two) times daily. 60 capsule 1  . ferrous sulfate 325 (65 FE) MG tablet Take 1 tablet (325 mg total) by mouth 2 (two) times daily with a meal. 60 tablet 3  . fluticasone (FLONASE) 50 MCG/ACT nasal spray Place 2 sprays into both nostrils as needed for allergies or rhinitis.    . folic acid (FOLVITE) 1 MG tablet TAKE 1 TABLET BY MOUTH EVERY DAY 90 tablet 1  . furosemide (LASIX) 40 MG tablet Take 1 tablet (40 mg total) by mouth daily as needed for edema (edema and weight gain). 45 tablet 1  . gabapentin (NEURONTIN) 100 MG capsule Take 1 capsule (100 mg total) by mouth at bedtime. 30 capsule 1  . glucose blood (ACCU-CHEK GUIDE) test strip USE UP TO FOUR TIMES DAILY AS DIRECTED 100 strip 0  . levothyroxine (SYNTHROID) 112 MCG tablet  Take 1 tablet (112 mcg total) by mouth daily before breakfast. 90 tablet 1  . metFORMIN (GLUCOPHAGE) 500 MG tablet Take 1 tablet (500 mg total) by mouth daily with breakfast. 90 tablet 1  . Multiple Vitamins-Minerals (MULTIVITAMIN GUMMIES MENS PO) Take 2 tablets by mouth daily with breakfast.     . nitroGLYCERIN (NITROSTAT) 0.4 MG SL tablet Place 1 tablet (0.4 mg total) under the tongue every 5 (five) minutes as needed for chest pain. Up to 3 doses 10 tablet 2  . OXYGEN Inhale 2 L into the lungs at bedtime.    . pantoprazole (PROTONIX) 40 MG tablet Take 1 tablet (40 mg total) by mouth every morning. 90 tablet 1  . sennosides-docusate sodium (SENOKOT-S) 8.6-50 MG tablet Take 1 tablet by mouth as needed for constipation.    . traMADol (ULTRAM) 50 MG tablet Take 1 tablet (50 mg total) by mouth 2 (two) times daily. 60 tablet 0  . traMADol (ULTRAM-ER) 200 MG 24 hr tablet Take 1 tablet (200 mg total) by mouth daily. 30 tablet 0   No current facility-administered medications for this visit.     Allergies:   Patient has no known allergies.    Social History:  The patient  reports that he has never smoked. He has never used smokeless tobacco. He reports that he does not drink alcohol or use drugs.   Family History:  The patient's family history includes Lung cancer in his father; Stroke in his mother.    ROS:  General:no colds or fevers, + 30 lb wt loss with recent illness. + fatigue  Skin:no rashes or ulcers HEENT:no blurred vision, no congestion CV:see HPI PUL:see HPI uses 02 at HS for his sleep apnea GI:no diarrhea constipation or melena, no indigestion GU:no hematuria, no dysuria MS:no joint pain, no claudication Neuro:no syncope, no lightheadedness Endo+ diabetes, no thyroid disease  Wt Readings from Last 3 Encounters:  01/26/19 234 lb 6.4 oz (106.3 kg)  01/11/19 238 lb (108 kg)  12/24/18 246 lb 14.6 oz (112 kg)     PHYSICAL EXAM: VS:  BP 124/78   Pulse 61  Ht 6' 3"  (1.905 m)    Wt 234 lb 6.4 oz (106.3 kg)   SpO2 96%   BMI 29.30 kg/m  , BMI Body mass index is 29.3 kg/m. General:Pleasant affect, NAD Skin:Warm and dry, brisk capillary refill HEENT:normocephalic, sclera clear, mucus membranes moist Neck:supple, no JVD, no bruits  Heart:irreg irreg  without murmur, gallup, rub or click Lungs:clear without rales, rhonchi, or wheezes EML:JQGB, non tender, + BS, do not palpate liver spleen or masses Ext:no lower ext edema, 2+ pedal pulses, 2+ radial pulses Neuro:alert and oriented X 3, MAE, follows commands, + facial symmetry    EKG:  EKG is NOT ordered today.    Recent Labs: 11/28/2018: TSH 1.822 12/17/2018: ALT 61 12/23/2018: B Natriuretic Peptide 361.3; Magnesium 2.4 01/26/2019: BUN 16; Creatinine, Ser 1.00; Hemoglobin 12.9; Platelets 205; Potassium 4.4; Sodium 145    Lipid Panel    Component Value Date/Time   CHOL 103 12/09/2018 0530   CHOL 138 12/05/2016 1100   TRIG 54 12/09/2018 0530   HDL 43 12/09/2018 0530   HDL 46 12/05/2016 1100   CHOLHDL 2.4 12/09/2018 0530   VLDL 11 12/09/2018 0530   LDLCALC 49 12/09/2018 0530   LDLCALC 67 12/05/2016 1100       Other studies Reviewed: Additional studies/ records that were reviewed today include: .  Cardiac cath 02/20/19  Ost RCA to Prox RCA lesion is 100% stenosed.  Ost LAD to Prox LAD lesion is 95% stenosed.  Ost 1st Diag lesion is 100% stenosed.  Dist LAD lesion is 95% stenosed.  Ost Cx to Prox Cx lesion is 100% stenosed.  Prox Cx to Mid Cx lesion is 100% stenosed.  LIMA graft was visualized by angiography and is normal in caliber.  The graft exhibits no disease.  SVG graft was visualized by angiography and is normal in caliber.  The graft exhibits no disease.  SVG graft was visualized by angiography and is normal in caliber.  The graft exhibits no disease.  Origin to Prox Graft lesion is 100% stenosed.  LV end diastolic pressure is normal.   1. Left dominant circulation 2.  Severe 3 vessel occlusive CAD.    - 95% ostial LAD    - 100% first diagonal    - 100% ostial LCx. This is new since 2017 - was 95% prior    - 100% nondominant RCA 3. Patent LIMA to the LAD. There is chronic severe disease in the apical LAD 4. Patent SVG to the first diagonal and SVG to the first OM as a Y graft. 5. Chronic occlusion of the SVG to terminal LCx 6. Normal LVEDP  Plan: continue medical management. Will resume IV heparin until coumadin is again therapeutic given history of CVA.   Echo 02/19/19 Study Conclusions  - Left ventricle: The cavity size was normal. There was mild focal   basal hypertrophy of the septum. Systolic function was normal.   The estimated ejection fraction was in the range of 55% to 60%.   Wall motion was normal; there were no regional wall motion   abnormalities. Doppler parameters are consistent with abnormal   left ventricular relaxation (grade 1 diastolic dysfunction).   Doppler parameters are consistent with elevated ventricular   end-diastolic filling pressure. - Mitral valve: Calcified annulus. Mildly thickened leaflets .   There was mild regurgitation. - Left atrium: The atrium was mildly dilated. - Right ventricle: The cavity size was mildly dilated. Wall   thickness was normal. Systolic function was mildly reduced. -  Right atrium: The atrium was normal in size. - Tricuspid valve: There was mild regurgitation. - Pulmonary arteries: Systolic pressure was within the normal   range. - Inferior vena cava: The vessel was normal in size. - Pericardium, extracardiac: There was no pericardial effusion.   ASSESSMENT AND PLAN:  1.  Persistent atrial fib to permanent --with recent events with a fib then spontaneous chest wall hematoma on amiodarone, hep and coumadin and asa - anticoagulation was stopped.   If CBC is normal after acute blood loss anemia may need ASA at least with CABG and hx of CVA.   In addition with CHA2DS2VASc of 7 thought to  resume coumadin without crossover.  Will discuss with Dr. Marlou Porch.  Pt wants to try SR so again will discuss with Dr. Marlou Porch, possible watchman - possible ablation would not use amiodarone again with prior pulmonary issues  On dilt 240 BID.  Pt believes brady is cause of fatigue so will have him wear 14 day zio patch to eval a fib.  And rate.   2.  Blood loss anemia will check CBC to see if resolved.  3.  CAD with hx CABG and patent 3 of 4 grafts.  Reviewed with pt and with normal EF not so much of an issue now.    4.  HLD continue statin well controlled LDL at 49  5.  Driving- defer to PCP pt did have some confusion in hospital. Today answers questions approp.  6.  Hx of CVA   7.  Chronic diastolic HF euvolemic today and overall a 30 lb wt loss with illness  8.  HTN stable on benazepril and dilt.   9.  DM-2 per PCP  10.  Renal insuf will check BMP    Current medicines are reviewed with the patient today.  The patient Has no concerns regarding medicines.  The following changes have been made:  See above Labs/ tests ordered today include:see above  Disposition:   FU:  see above  Signed, Cecilie Kicks, NP  01/27/2019 8:47 AM    [address] [place], [place], [place], [place] Phone: [phone]; Fax: [phone]

## 2019-01-26 NOTE — Patient Instructions (Signed)
Medication Instructions:  Your physician recommends that you continue on your current medications as directed. Please refer to the Current Medication list given to you today.  *If you need a refill on your cardiac medications before your next appointment, please call your pharmacy*  Lab Work: TODAY: BMET & CBC   If you have labs (blood work) drawn today and your tests are completely normal, you will receive your results only by: Marland Kitchen MyChart Message (if you have MyChart) OR . A paper copy in the mail If you have any lab test that is abnormal or we need to change your treatment, we will call you to review the results.  Testing/Procedures: A zio monitor was placed today. It will remain on for 14 days. You will then return monitor and event diary in provided box. It takes 1-2 weeks for report to be downloaded and returned to [place]. We will call you with the results. If monitor falls off or has orange flashing light, please call Zio for further instructions.     Follow-Up: You are scheduled to see Dr. Marlou Porch on 03/21/2019 @ 11:00 AM   Other Instructions

## 2019-01-27 ENCOUNTER — Encounter: Payer: Self-pay | Admitting: Cardiology

## 2019-01-27 DIAGNOSIS — I69351 Hemiplegia and hemiparesis following cerebral infarction affecting right dominant side: Secondary | ICD-10-CM | POA: Diagnosis not present

## 2019-01-27 DIAGNOSIS — E1142 Type 2 diabetes mellitus with diabetic polyneuropathy: Secondary | ICD-10-CM | POA: Diagnosis not present

## 2019-01-27 DIAGNOSIS — I69354 Hemiplegia and hemiparesis following cerebral infarction affecting left non-dominant side: Secondary | ICD-10-CM | POA: Diagnosis not present

## 2019-01-27 DIAGNOSIS — I480 Paroxysmal atrial fibrillation: Secondary | ICD-10-CM | POA: Diagnosis not present

## 2019-01-27 DIAGNOSIS — I5033 Acute on chronic diastolic (congestive) heart failure: Secondary | ICD-10-CM | POA: Diagnosis not present

## 2019-01-27 DIAGNOSIS — I110 Hypertensive heart disease with heart failure: Secondary | ICD-10-CM | POA: Diagnosis not present

## 2019-01-27 LAB — BASIC METABOLIC PANEL
BUN/Creatinine Ratio: 16 (ref 10–24)
BUN: 16 mg/dL (ref 8–27)
CO2: 25 mmol/L (ref 20–29)
Calcium: 10 mg/dL (ref 8.6–10.2)
Chloride: 103 mmol/L (ref 96–106)
Creatinine, Ser: 1 mg/dL (ref 0.76–1.27)
GFR calc Af Amer: 85 mL/min/{1.73_m2} (ref >59)
GFR calc non Af Amer: 73 mL/min/{1.73_m2} (ref >59)
Glucose: 81 mg/dL (ref 65–99)
Potassium: 4.4 mmol/L (ref 3.5–5.2)
Sodium: 145 mmol/L — ABNORMAL HIGH (ref 134–144)

## 2019-01-27 LAB — CBC
Hematocrit: 39.6 % (ref 37.5–51.0)
Hemoglobin: 12.9 g/dL — ABNORMAL LOW (ref 13.0–17.7)
MCH: 29.1 pg (ref 26.6–33.0)
MCHC: 32.6 g/dL (ref 31.5–35.7)
MCV: 89 fL (ref 79–97)
Platelets: 205 10*3/uL (ref 150–450)
RBC: 4.43 x10E6/uL (ref 4.14–5.80)
RDW: 14.6 % (ref 11.6–15.4)
WBC: 7.4 10*3/uL (ref 3.4–10.8)

## 2019-01-31 ENCOUNTER — Telehealth: Payer: Self-pay

## 2019-01-31 NOTE — Telephone Encounter (Signed)
-----   Message from Isaiah Serge, NP sent at 01/28/2019  8:10 PM EST ----- Please have pt resume his asprin 81 mg daily, tell him Dr. Marlou Porch and I discussed and please arrange an EP appt for pt with atrial fib.  He will wear a monitor for 2 weeks , so make appt in 2-3 weeks. Please.    Dr. Caryl Comes saw pt in hospital on general cardiology rounds.  Thanks [name]  ----- Message ----- From: Jerline Pain, MD Sent: 01/28/2019   1:40 PM EST To: Isaiah Serge, NP  Lets go ahead and give him aspirin 81 mg.  Agree with EP referral for potential watchman. Candee Furbish, MD ----- Message ----- From: Isaiah Serge, NP Sent: 01/27/2019   9:59 AM EST To: Isaiah Serge, NP, Jerline Pain, MD  Dr. Marlou Porch [name] had eventful hospitalizations.  He had spontaneous chest hematoma, asa and anticoagulation stopped.  Hgb stable, can I resume ASA for his CVA and CAD/CABG - then at discharge concern to keep off anticoagulation.  Please weigh in and pt does not like being in a fib.  + fatigue.  So he will wear a monitor for 14 days to eval rate,  Then should we send to EP for possible ablation/tikosyn   He would need  anticoagulation and also watchman was discussed in d/c summary    A lot in this note  Thanks.

## 2019-01-31 NOTE — Telephone Encounter (Signed)
Pt is scheduled to see [name] for Consult on 02/23/2019 at 11am.  Pt made aware of appt date and time by scheduler.

## 2019-02-01 DIAGNOSIS — I480 Paroxysmal atrial fibrillation: Secondary | ICD-10-CM | POA: Diagnosis not present

## 2019-02-01 DIAGNOSIS — E1142 Type 2 diabetes mellitus with diabetic polyneuropathy: Secondary | ICD-10-CM | POA: Diagnosis not present

## 2019-02-01 DIAGNOSIS — I5033 Acute on chronic diastolic (congestive) heart failure: Secondary | ICD-10-CM | POA: Diagnosis not present

## 2019-02-01 DIAGNOSIS — I69354 Hemiplegia and hemiparesis following cerebral infarction affecting left non-dominant side: Secondary | ICD-10-CM | POA: Diagnosis not present

## 2019-02-01 DIAGNOSIS — I110 Hypertensive heart disease with heart failure: Secondary | ICD-10-CM | POA: Diagnosis not present

## 2019-02-01 DIAGNOSIS — I69351 Hemiplegia and hemiparesis following cerebral infarction affecting right dominant side: Secondary | ICD-10-CM | POA: Diagnosis not present

## 2019-02-03 ENCOUNTER — Encounter: Payer: Self-pay | Admitting: Physical Medicine & Rehabilitation

## 2019-02-03 ENCOUNTER — Encounter: Payer: Medicare Other | Attending: Physical Medicine & Rehabilitation | Admitting: Physical Medicine & Rehabilitation

## 2019-02-03 ENCOUNTER — Other Ambulatory Visit: Payer: Self-pay

## 2019-02-03 VITALS — BP 122/76 | HR 74 | Temp 97.7°F | Ht 75.0 in | Wt 240.0 lb

## 2019-02-03 DIAGNOSIS — G8111 Spastic hemiplegia affecting right dominant side: Secondary | ICD-10-CM | POA: Diagnosis not present

## 2019-02-03 DIAGNOSIS — G8112 Spastic hemiplegia affecting left dominant side: Secondary | ICD-10-CM | POA: Diagnosis not present

## 2019-02-03 MED ORDER — TRAMADOL HCL 50 MG PO TABS: 50.0000 mg | ORAL_TABLET | Freq: Two times a day (BID) | ORAL | 1 refills | Status: DC

## 2019-02-03 MED ORDER — TRAMADOL HCL ER 200 MG PO TB24: 200.0000 mg | ORAL_TABLET | Freq: Every day | ORAL | 1 refills | Status: DC

## 2019-02-03 NOTE — Progress Notes (Signed)
Botox Injection for spasticity using needle EMG guidance °  °Dilution: 50 Units/ml °Indication: Severe spasticity which interferes with ADL,mobility and/or  hygiene and is unresponsive to medication management and other conservative care °Informed consent was obtained after describing risks and benefits of the procedure with the patient. This includes bleeding, bruising, infection, excessive weakness, or medication side effects. A REMS form is on file and signed. °Needle: 27g 1" needle electrode °Number of units per muscle °  ° left FDP 25 units ° left FDS 25 units °left biceps 50 units °[id number] °All injections were done after obtaining appropriate EMG activity and after negative drawback for blood. The patient tolerated the procedure well. Post procedure instructions were given. A followup appointment was made.     °

## 2019-02-03 NOTE — Patient Instructions (Signed)

## 2019-02-04 ENCOUNTER — Telehealth: Payer: Self-pay

## 2019-02-04 NOTE — Telephone Encounter (Signed)
14 day ZIO ordered and mailed to pt.  

## 2019-02-08 ENCOUNTER — Other Ambulatory Visit: Payer: Self-pay | Admitting: *Deleted

## 2019-02-11 DIAGNOSIS — I69354 Hemiplegia and hemiparesis following cerebral infarction affecting left non-dominant side: Secondary | ICD-10-CM | POA: Diagnosis not present

## 2019-02-11 DIAGNOSIS — I69351 Hemiplegia and hemiparesis following cerebral infarction affecting right dominant side: Secondary | ICD-10-CM | POA: Diagnosis not present

## 2019-02-11 DIAGNOSIS — I480 Paroxysmal atrial fibrillation: Secondary | ICD-10-CM | POA: Diagnosis not present

## 2019-02-11 DIAGNOSIS — E1142 Type 2 diabetes mellitus with diabetic polyneuropathy: Secondary | ICD-10-CM | POA: Diagnosis not present

## 2019-02-11 DIAGNOSIS — I110 Hypertensive heart disease with heart failure: Secondary | ICD-10-CM | POA: Diagnosis not present

## 2019-02-11 DIAGNOSIS — I5033 Acute on chronic diastolic (congestive) heart failure: Secondary | ICD-10-CM | POA: Diagnosis not present

## 2019-02-13 ENCOUNTER — Other Ambulatory Visit: Payer: Self-pay | Admitting: Cardiology

## 2019-02-13 ENCOUNTER — Ambulatory Visit (INDEPENDENT_AMBULATORY_CARE_PROVIDER_SITE_OTHER): Payer: Medicare Other

## 2019-02-13 DIAGNOSIS — I4819 Other persistent atrial fibrillation: Secondary | ICD-10-CM

## 2019-02-14 ENCOUNTER — Telehealth: Payer: Self-pay

## 2019-02-14 MED ORDER — ASPIRIN EC 81 MG PO TBEC: 81.0000 mg | DELAYED_RELEASE_TABLET | Freq: Every day | ORAL | 3 refills | Status: DC

## 2019-02-14 NOTE — Telephone Encounter (Signed)
-----   Message from Isaiah Serge, NP sent at 01/28/2019  8:10 PM EST ----- Please have pt resume his asprin 81 mg daily, tell him Dr. Marlou Porch and I discussed and please arrange an EP appt for pt with atrial fib.  He will wear a monitor for 2 weeks , so make appt in 2-3 weeks. Please.    Dr. Caryl Comes saw pt in hospital on general cardiology rounds.  Thanks Mickel Baas  ----- Message ----- From: Jerline Pain, MD Sent: 01/28/2019   1:40 PM EST To: Isaiah Serge, NP  Lets go ahead and give him aspirin 81 mg.  Agree with EP referral for potential watchman. Candee Furbish, MD ----- Message ----- From: Isaiah Serge, NP Sent: 01/27/2019   9:59 AM EST To: Isaiah Serge, NP, Jerline Pain, MD  Dr. Marlou Porch [name] had eventful hospitalizations.  He had spontaneous chest hematoma, asa and anticoagulation stopped.  Hgb stable, can I resume ASA for his CVA and CAD/CABG - then at discharge concern to keep off anticoagulation.  Please weigh in and pt does not like being in a fib.  + fatigue.  So he will wear a monitor for 14 days to eval rate,  Then should we send to EP for possible ablation/tikosyn   He would need  anticoagulation and also watchman was discussed in d/c summary    A lot in this note  Thanks.

## 2019-02-14 NOTE — Telephone Encounter (Signed)
Spoke with pt and advised per Cecilie Kicks, NP aznd [name] he needs to restart his ASA 81mg  daily.  Pt states he received his monitor and has been wearing for 2 days.  Pt is aware of 03/17/2019 appointment with [name].  Pt verbalized understanding and agrees with plan.

## 2019-02-15 DIAGNOSIS — E1142 Type 2 diabetes mellitus with diabetic polyneuropathy: Secondary | ICD-10-CM | POA: Diagnosis not present

## 2019-02-15 DIAGNOSIS — I69351 Hemiplegia and hemiparesis following cerebral infarction affecting right dominant side: Secondary | ICD-10-CM | POA: Diagnosis not present

## 2019-02-15 DIAGNOSIS — I5033 Acute on chronic diastolic (congestive) heart failure: Secondary | ICD-10-CM | POA: Diagnosis not present

## 2019-02-15 DIAGNOSIS — I110 Hypertensive heart disease with heart failure: Secondary | ICD-10-CM | POA: Diagnosis not present

## 2019-02-15 DIAGNOSIS — I480 Paroxysmal atrial fibrillation: Secondary | ICD-10-CM | POA: Diagnosis not present

## 2019-02-15 DIAGNOSIS — I69354 Hemiplegia and hemiparesis following cerebral infarction affecting left non-dominant side: Secondary | ICD-10-CM | POA: Diagnosis not present

## 2019-02-20 DIAGNOSIS — I480 Paroxysmal atrial fibrillation: Secondary | ICD-10-CM | POA: Diagnosis not present

## 2019-02-20 DIAGNOSIS — I110 Hypertensive heart disease with heart failure: Secondary | ICD-10-CM | POA: Diagnosis not present

## 2019-02-20 DIAGNOSIS — I69354 Hemiplegia and hemiparesis following cerebral infarction affecting left non-dominant side: Secondary | ICD-10-CM | POA: Diagnosis not present

## 2019-02-20 DIAGNOSIS — I69351 Hemiplegia and hemiparesis following cerebral infarction affecting right dominant side: Secondary | ICD-10-CM | POA: Diagnosis not present

## 2019-02-20 DIAGNOSIS — E1142 Type 2 diabetes mellitus with diabetic polyneuropathy: Secondary | ICD-10-CM | POA: Diagnosis not present

## 2019-02-20 DIAGNOSIS — I5033 Acute on chronic diastolic (congestive) heart failure: Secondary | ICD-10-CM | POA: Diagnosis not present

## 2019-02-22 ENCOUNTER — Telehealth: Payer: Self-pay | Admitting: Family

## 2019-02-22 NOTE — Telephone Encounter (Signed)
[facility], Archivist. Patient has declined nursing and just wants PT only ... Just making aware.

## 2019-02-22 NOTE — Telephone Encounter (Signed)
Just an FYI

## 2019-02-23 ENCOUNTER — Institutional Professional Consult (permissible substitution): Payer: Medicare Other | Admitting: Internal Medicine

## 2019-02-24 ENCOUNTER — Telehealth: Payer: Self-pay | Admitting: Cardiology

## 2019-02-24 ENCOUNTER — Telehealth: Payer: Self-pay | Admitting: *Deleted

## 2019-02-24 MED ORDER — DILTIAZEM HCL ER COATED BEADS 240 MG PO CP24: 240.0000 mg | ORAL_CAPSULE | Freq: Two times a day (BID) | ORAL | 2 refills | Status: DC

## 2019-02-24 NOTE — Telephone Encounter (Signed)
Spoke with [name] who is aware [name] will refill the Diltiazem but he will need to contact his PCP regarding refills for Neurontin.  He would like his refill send to [facility] on [address] and a 90 day supply.

## 2019-02-24 NOTE — Telephone Encounter (Signed)
Copied from [place] [phone]. Topic: General - Other >> Feb 24, 2019  1:03 PM Carolyn Stare wrote: Pt had a hosp fup in Oct and is asking if the below medication can be refilled   gabapentin (NEURONTIN) 100 MG capsule

## 2019-02-24 NOTE — Telephone Encounter (Signed)
New Message   Pt c/o medication issue:  1. Name of Medication: gabapentin (NEURONTIN) 100 MG capsule  diltiazem (CARDIZEM CD) 240 MG 24 hr capsule  2. How are you currently taking this medication (dosage and times per day)? As written  3. Are you having a reaction (difficulty breathing--STAT)?no   4. What is your medication issue? Patient needs a new prescription for these medications.

## 2019-02-25 ENCOUNTER — Other Ambulatory Visit: Payer: Self-pay

## 2019-02-25 MED ORDER — GABAPENTIN 100 MG PO CAPS: 100.0000 mg | ORAL_CAPSULE | Freq: Every day | ORAL | 1 refills | Status: DC

## 2019-02-25 NOTE — Telephone Encounter (Signed)
Rx refill was sent in for patient

## 2019-03-16 NOTE — Progress Notes (Signed)
ELECTROPHYSIOLOGY OFFICE NOTE  Patient ID: [name], MRN: [id number], DOB/AGE: [birth year] 73 y.o. Admit date: (Not on file) Date of Consult: 03/17/2019  Primary Physician: Debbrah Alar, NP Primary Cardiologist: Etter Sjogren        HPI [name] is a 73 y.o. male with a history of coronary artery disease with prior bypass in 2004 and stenting of his circumflex 2011.  He has recurrent atrial fibrillation.  During hospitalization 9/20 he developed a spontaneous chest wall hematoma that prompted the discontinuation of his anticoagulation.  Is currently on aspirin alone.  He has been previously managed on amiodarone discontinued 9/18 because of concerns of amiodarone lung toxicity based on a low DLCO (55) CT scan at that point demonstrated no interstitial lung disease.  Amiodarone was discontinued.  A decision was made as to "permanent atrial fibrillation "never, when seen more in [place] 11/20 is "#1 goal other than driving is to be back in normal rhythm."  Significant exercise intolerance.  No palpitations.  Some edema but limited to his left foot, the side of his body affected by his stroke.  He is frustrated about not being able to drive as noted above.  I am not sure what the restriction was although he was discharged 10/20 from rehab for multiple medical issues.  I do not see commentary restricting his driving.   DATE TEST EF   12/19  Echo   55-60 %   12//19 LHC   % LIMAp; SVG-D1& OM p; SVG -Cx T        Date Cr K Hgb  11/20 1.0 4.4 12.9         Thromboembolic risk factors ( age  -2, HTN-1, TIA/CVA-2,  Vasc disease -1) for a CHADSVASc Score of >=6     Past Medical History:  Diagnosis Date  . Acute encephalopathy 12/12/2015  . Acute respiratory failure with hypoxia and hypercapnia ([place]) 12/12/2015  . Allergic rhinitis 02/13/2010   Qualifier: Diagnosis of  By: Wynona Luna   . Angina at rest [facility]) 10/22/2015  . Ataxia 08/10/2013  . Atrial fibrillation  ([place]) 11/2018  . B12 deficiency 08/11/2013  . BACK PAIN, LUMBAR 01/29/2009   Qualifier: Diagnosis of  By: Wynona Luna   . BENIGN POSITIONAL VERTIGO 11/23/2009   Qualifier: Diagnosis of  By: Wynona Luna   . Chronic diastolic heart failure ([place]) 04/24/2015  . Chronic pain    left sided-[place]  . Coronary 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 ([place]) 11/04/2015   s/p CABG 2004; CFX DES 2011  . Essential hypertension 12/12/2006   Qualifier: Diagnosis of  By: [name] RMA, Lucy    . GERD 12/12/2006   Qualifier: Diagnosis of  By: [name] RMA, Lucy    . GOUT 12/12/2006   Qualifier: Diagnosis of  By: Reatha Armour, Lucy    . Hyperlipidemia LDL goal <70 12/12/2006   Qualifier: Diagnosis of  By: [name] RMA, Lucy     . Hypogonadism male 04/10/2011  . Hypothyroidism, acquired 02/15/2013  . Insomnia 05/21/2013  . Long term current use of anticoagulant therapy 06/24/2011  . Obstructive sleep apnea-Failed CPAP 06/17/2007   Failed CPAP Using O 2 for sleep   . Paroxysmal atrial fibrillation ([place]) 12/06/2016  . 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 Thoracic aorta atherosclerosis ([place]) 04/27/2017  . Tubular adenoma of colon 05/2010  . Type 2 diabetes mellitus without  complication, without long-term current use of insulin ([place]) 10/05/2007   Qualifier: Diagnosis of  By: Wynona Luna   . Vasovagal syncope       Surgical History:  Past Surgical History:  Procedure Laterality Date  . CARDIAC CATHETERIZATION N/A 10/25/2015   Procedure: Left Heart Cath and Cors/Grafts Angiography;  Surgeon: Troy Sine, MD;  Location: [place] [facility];  Service: Cardiovascular;  Laterality: N/A;  . CARDIOVERSION  06/20/2011   Procedure: CARDIOVERSION;  Surgeon: Larey Dresser, MD;  Location: [facility] ENDOSCOPY;  Service: Cardiovascular;  Laterality: N/A;  . CARDIOVERSION N/A 04/26/2015   Procedure: CARDIOVERSION;  Surgeon: Dorothy Spark, MD;  Location[facility]  ENDOSCOPY;  Service: Cardiovascular;  Laterality: N/A;  . CARDIOVERSION N/A 05/10/2015   Procedure: CARDIOVERSION;  Surgeon: Larey Dresser, MD;  Location: [place];  Service: Cardiovascular;  Laterality: N/A;  . CARDIOVERSION N/A 12/10/2018   Procedure: CARDIOVERSION;  Surgeon: Sanda Brenleigh Collet, MD;  Location: [place];  Service: Cardiovascular;  Laterality: N/A;  . CATARACT EXTRACTION  05/2010   left eye  . CATARACT EXTRACTION  04/2010   right eye  . CORONARY ARTERY BYPASS GRAFT     stent  . ESOPHAGOGASTRODUODENOSCOPY  03-25-2005  . LEFT HEART CATH AND CORS/GRAFTS ANGIOGRAPHY N/A 02/19/2018   Procedure: LEFT HEART CATH AND CORS/GRAFTS ANGIOGRAPHY;  Surgeon: Martinique, Peter M, MD;  Location: [place] [facility];  Service: Cardiovascular;  Laterality: N/A;  . NASAL SEPTUM SURGERY    . PERCUTANEOUS PLACEMENT INTRAVASCULAR STENT CERVICAL CAROTID ARTERY     03-2009; using a drug-eluting platform of the circumflex cornoray artery with a 3.0 x 18 [employer] Promus drug-eluting platform post dilated to 3.75 with a noncompliant balloon.  . TEE WITHOUT CARDIOVERSION  06/20/2011   Procedure: TRANSESOPHAGEAL ECHOCARDIOGRAM (TEE);  Surgeon: Larey Dresser, MD;  Location: [facility] ENDOSCOPY;  Service: Cardiovascular;  Laterality: N/A;     Home Meds: Current Meds  Medication Sig  . acetaminophen (TYLENOL) 325 MG tablet Take 2 tablets (650 mg total) by mouth every 4 (four) hours as needed for headache or mild pain.  Marland Kitchen allopurinol (ZYLOPRIM) 100 MG tablet Take 1 tablet (100 mg total) by mouth 2 (two) times daily.  Marland Kitchen aspirin EC 81 MG tablet Take 1 tablet (81 mg total) by mouth daily.  Marland Kitchen atorvastatin (LIPITOR) 40 MG tablet Take 1 tablet (40 mg total) by mouth daily.  . benazepril (LOTENSIN) 10 MG tablet Take 10 mg by mouth daily.  . blood glucose meter kit and supplies KIT Dispense based on patient and insurance preference. Use up to four times daily as directed. (FOR ICD-9 250.00, 250.01).  .  cyanocobalamin 1000 MCG tablet Take 2,000 mcg by mouth daily.  Marland Kitchen diltiazem (CARDIZEM CD) 240 MG 24 hr capsule Take 1 capsule (240 mg total) by mouth 2 (two) times daily.  . ferrous sulfate 325 (65 FE) MG tablet Take 1 tablet (325 mg total) by mouth 2 (two) times daily with a meal.  . fluticasone (FLONASE) 50 MCG/ACT nasal spray Place 2 sprays into both nostrils as needed for allergies or rhinitis.  . folic acid (FOLVITE) 1 MG tablet TAKE 1 TABLET BY MOUTH EVERY DAY  . furosemide (LASIX) 40 MG tablet Take 1 tablet (40 mg total) by mouth daily as needed for edema (edema and weight gain).  Marland Kitchen gabapentin (NEURONTIN) 100 MG capsule Take 1 capsule (100 mg total) by mouth at bedtime.  Marland Kitchen glucose blood (ACCU-CHEK GUIDE) test strip USE UP TO FOUR  TIMES DAILY AS DIRECTED  . levothyroxine (SYNTHROID) 112 MCG tablet Take 1 tablet (112 mcg total) by mouth daily before breakfast.  . metFORMIN (GLUCOPHAGE) 500 MG tablet Take 1 tablet (500 mg total) by mouth daily with breakfast.  . Multiple Vitamins-Minerals (MULTIVITAMIN GUMMIES MENS PO) Take 2 tablets by mouth daily with breakfast.   . nitroGLYCERIN (NITROSTAT) 0.4 MG SL tablet Place 1 tablet (0.4 mg total) under the tongue every 5 (five) minutes as needed for chest pain. Up to 3 doses  . OXYGEN Inhale 2 L into the lungs at bedtime.  . pantoprazole (PROTONIX) 40 MG tablet Take 1 tablet (40 mg total) by mouth every morning.  . sennosides-docusate sodium (SENOKOT-S) 8.6-50 MG tablet Take 1 tablet by mouth as needed for constipation.  . traMADol (ULTRAM) 50 MG tablet Take 1 tablet (50 mg total) by mouth 2 (two) times daily.  . traMADol (ULTRAM-ER) 200 MG 24 hr tablet Take 1 tablet (200 mg total) by mouth daily.    Allergies: No Known Allergies  Social History   Socioeconomic History  . Marital status: Married    Spouse name: [name]  . Number of children: Not on file  . Years of education: 79  . Highest education level: Not on file  Occupational History    . 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 Smoking status: Never Smoker  . Smokeless tobacco: Never Used  Substance and Sexual Activity  . Alcohol use: No    Alcohol/week: 0.0 standard drinks    Comment: 1 beer a month  . Drug use: No  . Sexual activity: Not Currently  Other Topics Concern  . Not on file  Social History Narrative   Pt lives with wife. Does have stairs, but patient doesn't use them. Pt has completed technical school   Social Determinants of Health   Financial Resource Strain:   . Difficulty of Paying Living Expenses: Not on file  Food Insecurity:   . Worried About Charity fundraiser in the Last Year: Not on file  . Ran Out of Food in the Last Year: Not on file  Transportation Needs:   . Lack of Transportation (Medical): Not on file  . Lack of Transportation (Non-Medical): Not on file  Physical Activity:   . Days of Exercise per Week: Not on file  . Minutes of Exercise per Session: Not on file  Stress:   . Feeling of Stress : Not on file  Social Connections:   . Frequency of Communication with Friends and Family: Not on file  . Frequency of Social Gatherings with Friends and Family: Not on file  . Attends Religious Services: Not on file  . Active Member of Clubs or Organizations: Not on file  . Attends Archivist Meetings: Not on file  . Marital Status: Not on file  Intimate Partner Violence:   . Fear of Current or Ex-Partner: Not on file  . Emotionally Abused: Not on file  . Physically Abused: Not on file  . Sexually Abused: Not on file     Family History  Problem Relation Age of Onset  . Lung cancer Father        deceased  . Stroke Mother        deceased-MINISTROKES     ROS:  Please see the history of present illness.     All other systems reviewed and negative.    Physical Exam:  Blood pressure  120/72, pulse 73, height _0  (1.905 m), weight 238 lb 6.4 oz (108.1 kg), SpO2 97  %. General: Well developed, well nourished male in no acute distress. Head: Normocephalic, atraumatic, sclera non-icteric, no xanthomas, nares are without discharge. EENT: normal  Lymph Nodes:  none Neck: Negative for carotid bruits. JVD not elevated. Back:without scoliosis kyphosi  Lungs: Clear bilaterally to auscultation without wheezes, rales, or rhonchi. Breathing is unlabored. Heart: Irregularly irregular rate and rhythm  no rubs, or gallops appreciated. Abdomen: Soft, non-tender, non-distended with normoactive bowel sounds. No hepatomegaly. No rebound/guarding. No obvious abdominal masses. Msk:  Strength and tone appear normal for age. Extremities: No clubbing or cyanosis. No  edema.  Distal pedal pulses are 2+ and equal bilaterally. Skin: Warm and Dry Neuro: Alert and oriented X 3. CN III-XII intact left hemiparesis psych:  Responds to questions appropriately with a normal affect.      Labs: Cardiac Enzymes No results for input(s): CKTOTAL, CKMB, TROPONINI in the last 72 hours. CBC Lab Results  Component Value Date   WBC 7.4 01/26/2019   HGB 12.9 (L) 01/26/2019   HCT 39.6 01/26/2019   MCV 89 01/26/2019   PLT 205 01/26/2019   PROTIME: No results for input(s): LABPROT, INR in the last 72 hours. Chemistry No results for input(s): NA, K, CL, CO2, BUN, CREATININE, CALCIUM, PROT, BILITOT, ALKPHOS, ALT, AST, GLUCOSE in the last 168 hours.  Invalid input(s): LABALBU Lipids Lab Results  Component Value Date   CHOL 103 12/09/2018   HDL 43 12/09/2018   LDLCALC 49 12/09/2018   TRIG 54 12/09/2018   BNP Pro B Natriuretic peptide (BNP)  Date/Time Value Ref Range Status  06/17/2011 05:29 PM 1,910.0 (H) 0 - 125 pg/mL Final   Thyroid Function Tests: No results for input(s): TSH, T4TOTAL, T3FREE, THYROIDAB in the last 72 hours.  Invalid input(s): FREET3 Miscellaneous Lab Results  Component Value Date   DDIMER 0.42 04/27/2017    Radiology/Studies:  LONG TERM MONITOR (3-14  DAYS) ([facility])  Result Date: 03/16/2019  100% atrial fibrillation with avg HR of 88 bpm  No pauses, no prolonged bradycardia.  Has appt to discuss potential watchman vs anticoagulation with Dr. Caryl Comes on 12/31. Candee Furbish, MD   [id number] reviewed  12/23/18   Atrial fibrillation at 89 Intervals-/10/36  Event Recorder personnally reviewed demonstrating permanent atrial fibrillation.  Exercise heart rate averages over 100 and occasionally up to 150; these can go on for hours at a time Assessment and Plan:  Atrial fibrillation-persistent  ?  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  The opportunity for this would relate to his ability to be anticoagulated.  I do not know how best to make this decision as is not clear to me where the bleeding came from; no imaging was done.  Occurred when his INR was only 1.6.  I think we will discuss with Dr. Marlou Porch and we may need help from hematology to try to sort this out.  In the interim, we will pursue a strategy of rate control adding beta-blockers to his regime.  These were stopped sometime ago because of "bradycardia "in the context of sinus rhythm and adjunctive amiodarone.  Sinus rhythm is currently not the issue  If we cannot safely undertake anticoagulation  and cardioversion would consider AV junction and ablation if rate control cannot be accomplished medicinally  I see no cardiac reason for his being unable to drive.  There may be neurologic concerns related to residual deficits and his stroke.  We will asked him to follow-up with his PCP  More than 50% of 40 min was spent in counseling related to the above    Virl Axe

## 2019-03-17 ENCOUNTER — Encounter: Payer: Self-pay | Admitting: Internal Medicine

## 2019-03-17 ENCOUNTER — Other Ambulatory Visit: Payer: Self-pay

## 2019-03-17 ENCOUNTER — Ambulatory Visit (INDEPENDENT_AMBULATORY_CARE_PROVIDER_SITE_OTHER): Payer: Medicare Other | Admitting: Internal Medicine

## 2019-03-17 VITALS — BP 120/72 | HR 73 | Ht 75.0 in | Wt 238.4 lb

## 2019-03-17 DIAGNOSIS — I4819 Other persistent atrial fibrillation: Secondary | ICD-10-CM

## 2019-03-17 DIAGNOSIS — Z9861 Coronary angioplasty status: Secondary | ICD-10-CM

## 2019-03-17 DIAGNOSIS — I2510 Atherosclerotic heart disease of native coronary artery without angina pectoris: Secondary | ICD-10-CM | POA: Diagnosis not present

## 2019-03-17 MED ORDER — METOPROLOL SUCCINATE ER 25 MG PO TB24: 25.0000 mg | ORAL_TABLET | Freq: Every day | ORAL | 3 refills | Status: DC

## 2019-03-17 NOTE — Patient Instructions (Signed)
Medication Instructions:  Your physician has recommended you make the following change in your medication:  Begin taking Metoprolol Succinate 25mg  by mouth daily  *If you need a refill on your cardiac medications before your next appointment, please call your pharmacy*  Lab Work: None ordered.  If you have labs (blood work) drawn today and your tests are completely normal, you will receive your results only by: Marland Kitchen MyChart Message (if you have MyChart) OR . A paper copy in the mail If you have any lab test that is abnormal or we need to change your treatment, we will call you to review the results.  Testing/Procedures: None ordered.   Follow-Up: At [facility], you and your health needs are our priority.  As part of our continuing mission to provide you with exceptional heart care, we have created designated Provider Care Teams.  These Care Teams include your primary Cardiologist (physician) and Advanced Practice Providers (APPs -  Physician Assistants and Nurse Practitioners) who all work together to provide you with the care you need, when you need it.  Your next appointment:  As needed with [name]

## 2019-03-21 ENCOUNTER — Other Ambulatory Visit: Payer: Self-pay | Admitting: Family

## 2019-03-21 ENCOUNTER — Ambulatory Visit: Payer: Medicare Other | Admitting: Cardiology

## 2019-03-24 ENCOUNTER — Ambulatory Visit: Payer: Medicare Other | Admitting: Physical Medicine & Rehabilitation

## 2019-03-25 ENCOUNTER — Ambulatory Visit (INDEPENDENT_AMBULATORY_CARE_PROVIDER_SITE_OTHER): Payer: Medicare Other | Admitting: Cardiology

## 2019-03-25 ENCOUNTER — Other Ambulatory Visit: Payer: Self-pay

## 2019-03-25 ENCOUNTER — Encounter: Payer: Self-pay | Admitting: Cardiology

## 2019-03-25 VITALS — BP 110/56 | HR 58 | Ht 75.0 in | Wt 240.6 lb

## 2019-03-25 DIAGNOSIS — Z9861 Coronary angioplasty status: Secondary | ICD-10-CM

## 2019-03-25 DIAGNOSIS — I4819 Other persistent atrial fibrillation: Secondary | ICD-10-CM | POA: Diagnosis not present

## 2019-03-25 DIAGNOSIS — Z79899 Other long term (current) drug therapy: Secondary | ICD-10-CM

## 2019-03-25 DIAGNOSIS — I10 Essential (primary) hypertension: Secondary | ICD-10-CM | POA: Diagnosis not present

## 2019-03-25 DIAGNOSIS — I2510 Atherosclerotic heart disease of native coronary artery without angina pectoris: Secondary | ICD-10-CM

## 2019-03-25 LAB — CBC
Hematocrit: 38.4 % (ref 37.5–51.0)
Hemoglobin: 13.3 g/dL (ref 13.0–17.7)
MCH: 30.7 pg (ref 26.6–33.0)
MCHC: 34.6 g/dL (ref 31.5–35.7)
MCV: 89 fL (ref 79–97)
Platelets: 158 10*3/uL (ref 150–450)
RBC: 4.33 x10E6/uL (ref 4.14–5.80)
RDW: 14 % (ref 11.6–15.4)
WBC: 6.7 10*3/uL (ref 3.4–10.8)

## 2019-03-25 MED ORDER — APIXABAN 5 MG PO TABS: 5.0000 mg | ORAL_TABLET | Freq: Two times a day (BID) | ORAL | 11 refills | Status: DC

## 2019-03-25 NOTE — Patient Instructions (Addendum)
Medication Instructions:  Please stop your aspirin and start Eliquis 5 mg one tablet twice a day.  Continue all other medications as listed.  *If you need a refill on your cardiac medications before your next appointment, please call your pharmacy*  Lab Work: Please have blood work today and again in 1 week. (CBC)  If you have labs (blood work) drawn today and your tests are completely normal, you will receive your results only by: Marland Kitchen MyChart Message (if you have MyChart) OR . A paper copy in the mail If you have any lab test that is abnormal or we need to change your treatment, we will call you to review the results.  Follow-Up: At [facility], you and your health needs are our priority.  As part of our continuing mission to provide you with exceptional heart care, we have created designated Provider Care Teams.  These Care Teams include your primary Cardiologist (physician) and Advanced Practice Providers (APPs -  Physician Assistants and Nurse Practitioners) who all work together to provide you with the care you need, when you need it.  Your next appointment:   4 week(s)  The format for your next appointment:   In Person  Provider:   Cecilie Kicks, NP  Thank you for choosing [facility]!!

## 2019-03-25 NOTE — Progress Notes (Signed)
Cardiology Office Note:    Date:  03/25/2019   ID:  [name], DOB 1945/06/11, MRN [id number]  PCP:  Debbrah Alar, NP  Cardiologist:  Candee Furbish, MD  Electrophysiologist:  None   Referring MD: Debbrah Alar, NP    History of Present Illness:    [name] is a 74 y.o. male here for follow-up of persistent atrial fibrillation, recent hospitalization with significant chest wall bleed following DC cardioversion which was essentially unsuccessful secondary to return to's atrial fibrillation, CAD post bypass surgery with 3 out of 4 grafts patent, chronic diastolic heart failure, residual left-sided weakness and numbness from stroke in 1996 here for follow-up.  Carefully reviewed Dr. Olin Pia clinic note.  Tough decisions on whether or not to restart anticoagulation.  We had a lengthy conversation today.  I am willing to try with Eliquis 5 mg twice a day with very close surveillance.  He is frustrated by his lack of energy mobility shortness of breath with activity.  He thinks that if sinus rhythm were restored he would retrieve a sense of normality.  We had lengthy conversation about this as well.  I think he is under excellent rate control currently at 58 bpm with the addition of metoprolol by Dr. Caryl Comes.  His last conversion was only successful for a few hours.  I do not think that we would have good success at restoring sinus rhythm and nor do I think that restoration of sinus rhythm would return him to a status of excellent exercise effort.    Past Medical History:  Diagnosis Date  . Acute encephalopathy 12/12/2015  . Acute respiratory failure with hypoxia and hypercapnia ([place]) 12/12/2015  . Allergic rhinitis 02/13/2010   Qualifier: Diagnosis of  By: Wynona Luna   . Angina at rest [facility]) 10/22/2015  . Ataxia 08/10/2013  . Atrial fibrillation ([place]) 11/2018  . B12 deficiency 08/11/2013  . BACK PAIN, LUMBAR 01/29/2009   Qualifier: Diagnosis of  By: Wynona Luna   . BENIGN POSITIONAL VERTIGO 11/23/2009   Qualifier: Diagnosis of  By: Wynona Luna   . Chronic diastolic heart failure ([place]) 04/24/2015  . Chronic pain    left sided-[place]  . Coronary 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 ([place]) 11/04/2015   s/p CABG 2004; CFX DES 2011  . Essential hypertension 12/12/2006   Qualifier: Diagnosis of  By: [name] RMA, Lucy    . GERD 12/12/2006   Qualifier: Diagnosis of  By: [name] RMA, Lucy    . GOUT 12/12/2006   Qualifier: Diagnosis of  By: Reatha Armour, Lucy    . Hyperlipidemia LDL goal <70 12/12/2006   Qualifier: Diagnosis of  By: [name] RMA, Lucy     . Hypogonadism male 04/10/2011  . Hypothyroidism, acquired 02/15/2013  . Insomnia 05/21/2013  . Long term current use of anticoagulant therapy 06/24/2011  . Obstructive sleep apnea-Failed CPAP 06/17/2007   Failed CPAP Using O 2 for sleep   . Paroxysmal atrial fibrillation ([place]) 12/06/2016  . 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 Thoracic aorta atherosclerosis ([place]) 04/27/2017  . Tubular adenoma of colon 05/2010  . Type 2 diabetes mellitus without complication, without long-term current use of insulin ([place]) 10/05/2007   Qualifier: Diagnosis of  By: Wynona Luna   . Vasovagal syncope     Past Surgical History:  Procedure Laterality Date  . CARDIAC CATHETERIZATION N/A 10/25/2015  Procedure: Left Heart Cath and Cors/Grafts Angiography;  Surgeon: Troy Sine, MD;  Location: [place] CV LAB;  Service: Cardiovascular;  Laterality: N/A;  . CARDIOVERSION  06/20/2011   Procedure: CARDIOVERSION;  Surgeon: Larey Dresser, MD;  Location: [facility] ENDOSCOPY;  Service: Cardiovascular;  Laterality: N/A;  . CARDIOVERSION N/A 04/26/2015   Procedure: CARDIOVERSION;  Surgeon: Dorothy Spark, MD;  Location: [facility] ENDOSCOPY;  Service: Cardiovascular;  Laterality: N/A;  . CARDIOVERSION N/A 05/10/2015   Procedure: CARDIOVERSION;  Surgeon: Larey Dresser,  MD;  Location: [place];  Service: Cardiovascular;  Laterality: N/A;  . CARDIOVERSION N/A 12/10/2018   Procedure: CARDIOVERSION;  Surgeon: Sanda Klein, MD;  Location: [place];  Service: Cardiovascular;  Laterality: N/A;  . CATARACT EXTRACTION  05/2010   left eye  . CATARACT EXTRACTION  04/2010   right eye  . CORONARY ARTERY BYPASS GRAFT     stent  . ESOPHAGOGASTRODUODENOSCOPY  03-25-2005  . LEFT HEART CATH AND CORS/GRAFTS ANGIOGRAPHY N/A 02/19/2018   Procedure: LEFT HEART CATH AND CORS/GRAFTS ANGIOGRAPHY;  Surgeon: Martinique, Peter M, MD;  Location: [place] CV LAB;  Service: Cardiovascular;  Laterality: N/A;  . NASAL SEPTUM SURGERY    . PERCUTANEOUS PLACEMENT INTRAVASCULAR STENT CERVICAL CAROTID ARTERY     03-2009; using a drug-eluting platform of the circumflex cornoray artery with a 3.0 x 18 [employer] Promus drug-eluting platform post dilated to 3.75 with a noncompliant balloon.  . TEE WITHOUT CARDIOVERSION  06/20/2011   Procedure: TRANSESOPHAGEAL ECHOCARDIOGRAM (TEE);  Surgeon: Larey Dresser, MD;  Location[facility] ENDOSCOPY;  Service: Cardiovascular;  Laterality: N/A;    Current Medications: Current Meds  Medication Sig  . acetaminophen (TYLENOL) 325 MG tablet Take 2 tablets (650 mg total) by mouth every 4 (four) hours as needed for headache or mild pain.  Marland Kitchen allopurinol (ZYLOPRIM) 100 MG tablet Take 1 tablet (100 mg total) by mouth 2 (two) times daily.  Marland Kitchen atorvastatin (LIPITOR) 40 MG tablet Take 1 tablet (40 mg total) by mouth daily.  . benazepril (LOTENSIN) 10 MG tablet Take 10 mg by mouth daily.  . blood glucose meter kit and supplies KIT Dispense based on patient and insurance preference. Use up to four times daily as directed. (FOR ICD-9 250.00, 250.01).  . cyanocobalamin 1000 MCG tablet Take 2,000 mcg by mouth daily.  Marland Kitchen diltiazem (CARDIZEM CD) 240 MG 24 hr capsule Take 1 capsule (240 mg total) by mouth 2 (two) times daily.  . ferrous sulfate 325 (65 FE) MG tablet Take  1 tablet (325 mg total) by mouth 2 (two) times daily with a meal.  . fluticasone (FLONASE) 50 MCG/ACT nasal spray Place 2 sprays into both nostrils as needed for allergies or rhinitis.  . folic acid (FOLVITE) 1 MG tablet TAKE 1 TABLET BY MOUTH EVERY DAY  . furosemide (LASIX) 40 MG tablet Take 1 tablet (40 mg total) by mouth daily as needed for edema (edema and weight gain).  Marland Kitchen gabapentin (NEURONTIN) 100 MG capsule Take 1 capsule (100 mg total) by mouth at bedtime.  Marland Kitchen glucose blood (ACCU-CHEK GUIDE) test strip USE AS DIRECTED UP TO 4 TIMES DAILY  . levothyroxine (SYNTHROID) 112 MCG tablet Take 1 tablet (112 mcg total) by mouth daily before breakfast.  . metFORMIN (GLUCOPHAGE) 500 MG tablet Take 1 tablet (500 mg total) by mouth daily with breakfast.  . metoprolol succinate (TOPROL XL) 25 MG 24 hr tablet Take 1 tablet (25 mg total) by mouth daily.  . Multiple Vitamins-Minerals (MULTIVITAMIN GUMMIES  MENS PO) Take 2 tablets by mouth daily with breakfast.   . nitroGLYCERIN (NITROSTAT) 0.4 MG SL tablet Place 1 tablet (0.4 mg total) under the tongue every 5 (five) minutes as needed for chest pain. Up to 3 doses  . OXYGEN Inhale 2 L into the lungs at bedtime.  . pantoprazole (PROTONIX) 40 MG tablet Take 1 tablet (40 mg total) by mouth every morning.  . sennosides-docusate sodium (SENOKOT-S) 8.6-50 MG tablet Take 1 tablet by mouth as needed for constipation.  . traMADol (ULTRAM) 50 MG tablet Take 1 tablet (50 mg total) by mouth 2 (two) times daily.  . traMADol (ULTRAM-ER) 200 MG 24 hr tablet Take 1 tablet (200 mg total) by mouth daily.  . [DISCONTINUED] aspirin EC 81 MG tablet Take 1 tablet (81 mg total) by mouth daily.     Allergies:   Patient has no known allergies.   Social History   Socioeconomic History  . Marital status: Married    Spouse name: [name]  . Number of children: Not on file  . Years of education: 98  . Highest education level: Not on file  Occupational History  . Occupation:  retired    Fish farm manager: OTHER    Comment: Mining engineer  Tobacco Use  . Smoking status: Never Smoker  . Smokeless tobacco: Never Used  Substance and Sexual Activity  . Alcohol use: No    Alcohol/week: 0.0 standard drinks    Comment: 1 beer a month  . Drug use: No  . Sexual activity: Not Currently  Other Topics Concern  . Not on file  Social History Narrative   Pt lives with wife. Does have stairs, but patient doesn't use them. Pt has completed technical school   Social Determinants of Health   Financial Resource Strain:   . Difficulty of Paying Living Expenses: Not on file  Food Insecurity:   . Worried About Charity fundraiser in the Last Year: Not on file  . Ran Out of Food in the Last Year: Not on file  Transportation Needs:   . Lack of Transportation (Medical): Not on file  . Lack of Transportation (Non-Medical): Not on file  Physical Activity:   . Days of Exercise per Week: Not on file  . Minutes of Exercise per Session: Not on file  Stress:   . Feeling of Stress : Not on file  Social Connections:   . Frequency of Communication with Friends and Family: Not on file  . Frequency of Social Gatherings with Friends and Family: Not on file  . Attends Religious Services: Not on file  . Active Member of Clubs or Organizations: Not on file  . Attends Archivist Meetings: Not on file  . Marital Status: Not on file     Family History: The patient's family history includes Lung cancer in his father; Stroke in his mother.  ROS:   Please see the history of present illness.      EKGs/Labs/Other Studies Reviewed:    The following studies were reviewed today:  Cardiac catheterization 02/19/2018: 1. Left dominant circulation 2. Severe 3 vessel occlusive CAD.    - 95% ostial LAD    - 100% first diagonal    - 100% ostial LCx. This is new since 2017 - was 95% prior    - 100% nondominant RCA 3. Patent LIMA to the LAD. There is chronic severe disease in the apical  LAD 4. Patent SVG to the first diagonal and SVG to the first OM  as a Y graft. 5. Chronic occlusion of the SVG to terminal LCx 6. Normal LVEDP  EKG: 12/23/2018-atrial fibrillation heart rate in the 70s with nonspecific ST-T wave changes.  Recent Labs: 11/28/2018: TSH 1.822 12/17/2018: ALT 61 12/23/2018: B Natriuretic Peptide 361.3; Magnesium 2.4 01/26/2019: BUN 16; Creatinine, Ser 1.00; Hemoglobin 12.9; Platelets 205; Potassium 4.4; Sodium 145  Recent Lipid Panel    Component Value Date/Time   CHOL 103 12/09/2018 0530   CHOL 138 12/05/2016 1100   TRIG 54 12/09/2018 0530   HDL 43 12/09/2018 0530   HDL 46 12/05/2016 1100   CHOLHDL 2.4 12/09/2018 0530   VLDL 11 12/09/2018 0530   LDLCALC 49 12/09/2018 0530   LDLCALC 67 12/05/2016 1100    Physical Exam:    VS:  BP (!) 110/56   Pulse (!) 58   Ht 6' 3"  (1.905 m)   Wt 240 lb 9.6 oz (109.1 kg)   SpO2 96%   BMI 30.07 kg/m     Wt Readings from Last 3 Encounters:  03/25/19 240 lb 9.6 oz (109.1 kg)  03/17/19 238 lb 6.4 oz (108.1 kg)  02/03/19 240 lb (108.9 kg)     GEN: Well nourished, well developed, in no acute distress, slow gait utilizing walker HEENT: normal  Neck: no JVD, carotid bruits, or masses Cardiac: IRRR; no murmurs, rubs, or gallops,no edema  Respiratory:  clear to auscultation bilaterally, normal work of breathing GI: soft, nontender, nondistended, + BS MS: no deformity or atrophy  Skin: warm and dry, no rash, hematoma on chest has resolved Neuro:  Alert and Oriented Psych: euthymic mood, full affect   ASSESSMENT:    1. Persistent atrial fibrillation ([place])   2. Medication management   3. Essential hypertension   4. CAD S/P CFX DES 2011    PLAN:    In order of problems listed above:  Coronary artery disease status post CABG - 1 of the 4 grafts were down in 2019, distal circumflex graft.  Overall reassuring otherwise.  Medical management.  Explained to him this finding, he was under the impression that all  grafts were open.  Explained that even though the distal circumflex graft was closed that this had been closed for quite some time and was chronic and to continue with his medical management.  Reassurance. -No further chest pain.  LDL 57, statin, no changes made.  persistent atrial fibrillation chads vas score is 6 -Back in 12/05/2018 admission developed a spontaneous chest wall hematoma that prompted discontinuation of anticoagulation.  He underwent a cardioversion but had return of atrial fibrillation shortly thereafter with course complicated by this chest wall hematoma.  Had been on aspirin only.  In review of Dr. Olin Pia last clinic note, he was obviously frustrated about not being able to drive he stated that he was not sure what the restriction was although he was discharged from rehab for multiple medical issues.  I do not see commentary restricting his driving.  -A long discussion was had with Dr. Caryl Comes regarding persistent atrial fibrillation and him wanting to be back in sinus rhythm.  Dr. Caryl Comes stated that the opportunity for this would relate to his ability to be anticoagulated.  He stated that he did not know how best to make this decision as it was not clear where the bleeding came from.  This occurred when his INR was 1.6.  May need hematology's help to sort out.  It was felt at the end of the hospitalization that he would not  be a good candidate for anticoagulation moving forward.  Rate control was pursued with adding beta-blockers to his regimen.  These were stopped previously because of potential bradycardia in the context of amiodarone in sinus rhythm.  If rate control cannot be attained, AV ablation could be considered.  Dr. Caryl Comes stated that he did not see any cardiac reason for him not to be able to drive but today obviously he is utilizing a walker, slow gait, perhaps this was the neurologic residual deficits of stroke.   -Ultimately today, given his stability from a hematologic  perspective, I think we should retest/retrial anticoagulation with Eliquis 5 mg twice a day.  We will stop his aspirin 81 mg.  Close surveillance given chest wall bleed previously.  Check CBC now and in 1 week.  He will have close follow-up in approximately 3 weeks with Cecilie Kicks.  Obviously bleeding occurs again, we will need to stop.  He understands this after lengthy discussion and is willing to proceed.  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 Lamonte Sakai.  Chronic diastolic heart failure, NYHA class II-III -Normal EF.  No evidence of significant volume overload.  He is wearing compression stockings that he found on the Internet for $4.  His left leg is usually worse than the right but today looks fairly good.  Stroke 1996 -Mild left hemiplegia, aspirin Lipitor.  He is utilizing a walker, slow gait.  Angina -Overall well controlled.  Hypothyroidism - Synthroid.  Diabetes with hypertension -Currently on Glucophage, treated by Debbrah Alar, NP, stable    Medication Adjustments/Labs and Tests Ordered: Current medicines are reviewed at length with the patient today.  Concerns regarding medicines are outlined above.  Orders Placed This Encounter  Procedures  . CBC  . CBC   Meds ordered this encounter  Medications  . apixaban (ELIQUIS) 5 MG TABS tablet    Sig: Take 1 tablet (5 mg total) by mouth 2 (two) times daily.    Dispense:  60 tablet    Refill:  11    Patient Instructions  Medication Instructions:  Please stop your aspirin and start Eliquis 5 mg one tablet twice a day.  Continue all other medications as listed.  *If you need a refill on your cardiac medications before your next appointment, please call your pharmacy*  Lab Work: Please have blood work today and again in 1 week. (CBC)  If you have labs (blood work) drawn today and your tests are completely normal, you will  receive your results only by: Marland Kitchen MyChart Message (if you have MyChart) OR . A paper copy in the mail If you have any lab test that is abnormal or we need to change your treatment, we will call you to review the results.  Follow-Up: At [facility], you and your health needs are our priority.  As part of our continuing mission to provide you with exceptional heart care, we have created designated Provider Care Teams.  These Care Teams include your primary Cardiologist (physician) and Advanced Practice Providers (APPs -  Physician Assistants and Nurse Practitioners) who all work together to provide you with the care you need, when you need it.  Your next appointment:   4 week(s)  The format for your next appointment:   In Person  Provider:   Cecilie Kicks, NP  Thank you for choosing [facility]!!         Signed, Elta Guadeloupe  Marlou Porch, MD  03/25/2019 10:12 AM    [facility]

## 2019-03-29 ENCOUNTER — Encounter: Payer: Medicare Other | Attending: Physical Medicine & Rehabilitation | Admitting: Physical Medicine & Rehabilitation

## 2019-03-29 ENCOUNTER — Encounter: Payer: Self-pay | Admitting: Physical Medicine & Rehabilitation

## 2019-03-29 ENCOUNTER — Other Ambulatory Visit: Payer: Self-pay

## 2019-03-29 VITALS — BP 122/76 | HR 76 | Temp 97.0°F | Ht 75.0 in | Wt 243.0 lb

## 2019-03-29 DIAGNOSIS — I2510 Atherosclerotic heart disease of native coronary artery without angina pectoris: Secondary | ICD-10-CM | POA: Diagnosis not present

## 2019-03-29 DIAGNOSIS — G8112 Spastic hemiplegia affecting left dominant side: Secondary | ICD-10-CM

## 2019-03-29 DIAGNOSIS — Z9861 Coronary angioplasty status: Secondary | ICD-10-CM

## 2019-03-29 DIAGNOSIS — G8111 Spastic hemiplegia affecting right dominant side: Secondary | ICD-10-CM | POA: Insufficient documentation

## 2019-03-29 NOTE — Progress Notes (Signed)
Subjective:    Patient ID: [name], male    DOB: 1945-08-29, 74 y.o.   MRN: [id number]  HPI Now on Eliquis Off warfarin, since hospitalization for spontaneous chest wall hematoma  Botox 8 wks ago left FDP 25 units left FDS 25 units left biceps 50 units Hamstrings200  Patient feels like his spasticity has been relieved post procedure.  He had no excessive bruising or other Pain Inventory Average Pain 6 Pain Right Now 5 My pain is constant and tingling  In the last 24 hours, has pain interfered with the following? General activity 5 Relation with others 2 Enjoyment of life 0 What TIME of day is your pain at its worst? evening Sleep (in general) Poor  Pain is worse with: walking Pain improves with: therapy/exercise and medication Relief from Meds: 9  Mobility walk with assistance use a walker how many minutes can you walk? 20 ability to climb steps?  yes do you drive?  yes  Function retired  Neuro/Psych numbness tingling trouble walking  Prior Studies Any changes since last visit?  no  Physicians involved in your care Any changes since last visit?  no   Family History  Problem Relation Age of Onset  . Lung cancer Father        deceased  . Stroke Mother        deceased-MINISTROKES   Social History   Socioeconomic History  . Marital status: Married    Spouse name: [name]  . Number of children: Not on file  . Years of education: 30  . Highest education level: Not on file  Occupational History  . 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 Smoking status: Never Smoker  . Smokeless tobacco: Never Used  Substance and Sexual Activity  . Alcohol use: No    Alcohol/week: 0.0 standard drinks    Comment: 1 beer a month  . Drug use: No  . Sexual activity: Not Currently  Other Topics Concern  . Not on file  Social History Narrative   Pt lives with wife. Does have stairs, but patient doesn't use  them. Pt has completed technical school   Social Determinants of Health   Financial Resource Strain:   . Difficulty of Paying Living Expenses: Not on file  Food Insecurity:   . Worried About Charity fundraiser in the Last Year: Not on file  . Ran Out of Food in the Last Year: Not on file  Transportation Needs:   . Lack of Transportation (Medical): Not on file  . Lack of Transportation (Non-Medical): Not on file  Physical Activity:   . Days of Exercise per Week: Not on file  . Minutes of Exercise per Session: Not on file  Stress:   . Feeling of Stress : Not on file  Social Connections:   . Frequency of Communication with Friends and Family: Not on file  . Frequency of Social Gatherings with Friends and Family: Not on file  . Attends Religious Services: Not on file  . Active Member of Clubs or Organizations: Not on file  . Attends Archivist Meetings: Not on file  . Marital Status: Not on file   Past Surgical History:  Procedure Laterality Date  . CARDIAC CATHETERIZATION N/A 10/25/2015   Procedure: Left Heart Cath and Cors/Grafts Angiography;  Surgeon: Troy Sine, MD;  Location: [place] [facility];  Service: Cardiovascular;  Laterality: N/A;  . CARDIOVERSION  06/20/2011   Procedure: CARDIOVERSION;  Surgeon: Larey Dresser, MD;  Location: [facility] ENDOSCOPY;  Service: Cardiovascular;  Laterality: N/A;  . CARDIOVERSION N/A 04/26/2015   Procedure: CARDIOVERSION;  Surgeon: Dorothy Spark, MD;  Location: [facility] ENDOSCOPY;  Service: Cardiovascular;  Laterality: N/A;  . CARDIOVERSION N/A 05/10/2015   Procedure: CARDIOVERSION;  Surgeon: Larey Dresser, MD;  Location: [place];  Service: Cardiovascular;  Laterality: N/A;  . CARDIOVERSION N/A 12/10/2018   Procedure: CARDIOVERSION;  Surgeon: Sanda Klein, MD;  Location: [place];  Service: Cardiovascular;  Laterality: N/A;  . CATARACT EXTRACTION  05/2010   left eye  . CATARACT EXTRACTION  04/2010   right eye  . CORONARY ARTERY  BYPASS GRAFT     stent  . ESOPHAGOGASTRODUODENOSCOPY  03-25-2005  . LEFT HEART CATH AND CORS/GRAFTS ANGIOGRAPHY N/A 02/19/2018   Procedure: LEFT HEART CATH AND CORS/GRAFTS ANGIOGRAPHY;  Surgeon: Martinique, Peter M, MD;  Location: [place] [facility];  Service: Cardiovascular;  Laterality: N/A;  . NASAL SEPTUM SURGERY    . PERCUTANEOUS PLACEMENT INTRAVASCULAR STENT CERVICAL CAROTID ARTERY     03-2009; using a drug-eluting platform of the circumflex cornoray artery with a 3.0 x 18 [employer] Promus drug-eluting platform post dilated to 3.75 with a noncompliant balloon.  . TEE WITHOUT CARDIOVERSION  06/20/2011   Procedure: TRANSESOPHAGEAL ECHOCARDIOGRAM (TEE);  Surgeon: Larey Dresser, MD;  Location: [place];  Service: Cardiovascular;  Laterality: N/A;   Past Medical History:  Diagnosis Date  . Acute encephalopathy 12/12/2015  . Acute respiratory failure with hypoxia and hypercapnia ([place]) 12/12/2015  . Allergic rhinitis 02/13/2010   Qualifier: Diagnosis of  By: Wynona Luna   . Angina at rest [facility]) 10/22/2015  . Ataxia 08/10/2013  . Atrial fibrillation ([place]) 11/2018  . B12 deficiency 08/11/2013  . BACK PAIN, LUMBAR 01/29/2009   Qualifier: Diagnosis of  By: Wynona Luna   . BENIGN POSITIONAL VERTIGO 11/23/2009   Qualifier: Diagnosis of  By: Wynona Luna   . Chronic diastolic heart failure ([place]) 04/24/2015  . Chronic pain    left sided-Kristeins  . Coronary 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 ([place]) 11/04/2015   s/p CABG 2004; CFX DES 2011  . Essential hypertension 12/12/2006   Qualifier: Diagnosis of  By: [name] RMA, Lucy    . GERD 12/12/2006   Qualifier: Diagnosis of  By: [name] RMA, Lucy    . GOUT 12/12/2006   Qualifier: Diagnosis of  By: Reatha Armour, Lucy    . Hyperlipidemia LDL goal <70 12/12/2006   Qualifier: Diagnosis of  By: [name] RMA, Lucy     . Hypogonadism male 04/10/2011  . Hypothyroidism, acquired 02/15/2013  . Insomnia 05/21/2013  . Long term  current use of anticoagulant therapy 06/24/2011  . Obstructive sleep apnea-Failed CPAP 06/17/2007   Failed CPAP Using O 2 for sleep   . Paroxysmal atrial fibrillation ([place]) 12/06/2016  . 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 Thoracic aorta atherosclerosis ([place]) 04/27/2017  . Tubular adenoma of colon 05/2010  . Type 2 diabetes mellitus without complication, without long-term current use of insulin ([place]) 10/05/2007   Qualifier: Diagnosis of  By: Wynona Luna   . Vasovagal syncope    Temp (!) 97 F (36.1 C)   Ht 6\' 3"  (1.905 m)   Wt 243 lb (110.2 kg)   BMI 30.37 kg/m   Opioid Risk Score:   Fall Risk  Score:  `1  Depression screen PHQ 2/9  Depression screen [facility] 2/9 11/10/2018 07/28/2017 07/13/2017 05/21/2016  Decreased Interest 0 0 0 0  Down, Depressed, Hopeless 0 0 0 0  PHQ - 2 Score 0 0 0 0  Altered sleeping - - - 1  Tired, decreased energy - - - 1  Change in appetite - - - 0  Feeling bad or failure about yourself  - - - 0  Trouble concentrating - - - 0  Moving slowly or fidgety/restless - - - 0  Suicidal thoughts - - - 0  PHQ-9 Score - - - 2  Some recent data might be hidden    Review of Systems  Constitutional: Negative.   HENT: Negative.   Eyes: Negative.   Respiratory: Negative.   Cardiovascular: Negative.   Gastrointestinal: Negative.   Endocrine: Negative.   Genitourinary: Negative.   Musculoskeletal: Positive for arthralgias, back pain, gait problem, myalgias and neck pain.  Skin: Negative.   Allergic/Immunologic: Negative.   Neurological: Positive for numbness and headaches.       Tingling   Hematological: Negative.   Psychiatric/Behavioral: Negative.   All other systems reviewed and are negative.      Objective:   Physical Exam Vitals and nursing note reviewed.  Constitutional:      Appearance: Normal appearance.  Eyes:     Extraocular Movements: Extraocular movements intact.     Conjunctiva/sclera:  Conjunctivae normal.     Pupils: Pupils are equal, round, and reactive to light.  Skin:    General: Skin is warm and dry.     Findings: No bruising.  Neurological:     Mental Status: He is alert.     Comments: Motor strength is 4/5 in the left deltoid, bicep, tricep, grip, hip flexor, knee extensor, ankle dorsiflexor 5/5 in the right deltoid bicep tricep grip hip flexors knee extensor ankle dorsiflexor Tone MAS 1 at the left elbow flexor MAS 1 at the finger flexors MAS 2 at the knee flexors on the left side Ambulates with the walker no evidence of toe drag or knee instability.  Psychiatric:        Mood and Affect: Mood normal.        Behavior: Behavior normal.           Assessment & Plan:  #1.  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  He is doing well with current Botox dosing. We discussed that he was on a new anticoagulant and is still at risk for excess ecchymosis.  He has not had any issues with this since reducing needle size to 27-gauge  We will continue with current muscle group selection as well as current Botox dosing, repeat in 1 month

## 2019-03-29 NOTE — Patient Instructions (Signed)
Will continue same botox dose

## 2019-03-30 ENCOUNTER — Telehealth: Payer: Self-pay | Admitting: Cardiology

## 2019-03-30 NOTE — Telephone Encounter (Signed)
Jerline Pain, MD  Shellia Cleverly, RN  Hg 13.3 excellent. Restarted Eliquis. Order in for checking CBC again in one week. Prior chest wall bleed in 11/2018.  Candee Furbish, MD    Reviewed results with pt.  Pt was started on and has been taking Eliquis as rxed since 03/25/2019.  He is scheduled for repeat CBC 1/15 and is aware.

## 2019-03-30 NOTE — Telephone Encounter (Signed)
Patient returning call for results 

## 2019-04-01 ENCOUNTER — Other Ambulatory Visit: Payer: Medicare Other | Admitting: *Deleted

## 2019-04-01 ENCOUNTER — Other Ambulatory Visit: Payer: Self-pay

## 2019-04-01 DIAGNOSIS — Z79899 Other long term (current) drug therapy: Secondary | ICD-10-CM

## 2019-04-01 DIAGNOSIS — I4819 Other persistent atrial fibrillation: Secondary | ICD-10-CM | POA: Diagnosis not present

## 2019-04-02 LAB — CBC
Hematocrit: 40.2 % (ref 37.5–51.0)
Hemoglobin: 13.9 g/dL (ref 13.0–17.7)
MCH: 30.6 pg (ref 26.6–33.0)
MCHC: 34.6 g/dL (ref 31.5–35.7)
MCV: 89 fL (ref 79–97)
Platelets: 156 10*3/uL (ref 150–450)
RBC: 4.54 x10E6/uL (ref 4.14–5.80)
RDW: 14.2 % (ref 11.6–15.4)
WBC: 6.4 10*3/uL (ref 3.4–10.8)

## 2019-04-04 ENCOUNTER — Telehealth: Payer: Self-pay | Admitting: Family

## 2019-04-04 NOTE — Telephone Encounter (Signed)
Copied from [place] [id number]. Topic: General - Inquiry >> Apr 04, 2019 11:46 AM Percell Belt A wrote: Reason for CRM: pt called in an needs a script sent to [place] [facility] to renew this oxygen

## 2019-04-05 ENCOUNTER — Telehealth: Payer: Self-pay | Admitting: *Deleted

## 2019-04-05 DIAGNOSIS — D649 Anemia, unspecified: Secondary | ICD-10-CM

## 2019-04-05 NOTE — Telephone Encounter (Signed)
-----   Message from Jerline Pain, MD sent at 04/05/2019  7:37 AM EST ----- No anemia. Excellent.  Restarted Eliquis.  Prior bleeding in chest wall episode in the hospital. Continue to monitor CBC (at next visit with Cecilie Kicks, NP. Candee Furbish, MD

## 2019-04-05 NOTE — Telephone Encounter (Signed)
Spoke with the pt and endorsed to him that per Dr. Marlou Porch, his labs showed no anemia, and this is excellent. Pt restarted his Eliquis. Pt did have prior bleeding in chest wall episode in the hospital. Informed the pt that Dr. Marlou Porch still wants to continue to monitor her CBC (at next OV with Cecilie Kicks NP on 04/14/19 at 1:30 pm). Informed the pt that I will schedule him to have his CBC rechecked same day as he comes into the office on 04/14/19.  Advised the pt to arrive 20 mins prior to his visit with Mickel Baas, so that he can go by the lab to have his CBC checked.  Pt verbalized understanding and agrees with this plan.

## 2019-04-06 ENCOUNTER — Other Ambulatory Visit: Payer: Self-pay | Admitting: Family

## 2019-04-06 NOTE — Telephone Encounter (Signed)
Called patient to inform him I was not able to find a rx for oxygen from [place].  He asked me to look in to it.  Called [facility] in [place] and they confirmed the oxygen prescription is not from [facility] but from Dr. Baird Lyons at pulmonology. She will contact patient with this information to see if they can get a new oxygen rx from Dr. Annamaria Boots.

## 2019-04-13 NOTE — Progress Notes (Signed)
Cardiology Office Note   Date:  04/14/2019   ID:  [name], DOB 1945/04/23, MRN [id number]  PCP:  Debbrah Alar, NP  Cardiologist:  Dr. Marlou Porch     Chief Complaint  Patient presents with  . Atrial Fibrillation    resumed anticoagulation       History of Present Illness: [name] is a 74 y.o. male who presents for eval for resuming eliquis.  Previously had been on coumadin.    Hx of persistent atrial fibrillation, recent hospitalization with significant chest wall bleed following DC cardioversion which was essentially unsuccessful secondary to return to's atrial fibrillation, CAD post bypass surgery with 3 out of 4 grafts patent, chronic diastolic heart failure, residual left-sided weakness and numbness from stroke in 1996 here for follow-up.  Carefully reviewed Dr. Olin Pia clinic note.  Tough decisions on whether or not to restart anticoagulation.  We had a lengthy conversation today.  I am willing to try with Eliquis 5 mg twice a day with very close surveillance.  He is frustrated by his lack of energy mobility shortness of breath with activity.  He thinks that if sinus rhythm were restored he would retrieve a sense of normality.  We had lengthy conversation about this as well.  I think he is under excellent rate control currently at 58 bpm with the addition of metoprolol by Dr. Caryl Comes.  His last conversion was only successful for a few hours.  I do not think that we would have good success at restoring sinus rhythm and nor do I think that restoration of sinus rhythm would return him to a status of excellent exercise effort.  Placed back on Eliquis for CHA2DS2VASC of 6 --chest wall hematoma.   Close surveillance given chest wall bleed previously.  Check CBC now and in 1 week.  He will have close follow-up in approximately 3 weeks with Cecilie Kicks.  Obviously bleeding occurs again, we will need to stop.  He understands this after lengthy discussion and is willing  to proceed. Hgb normal.  Metoprolol had been added to medications for rhythm control    Today his hgbs have all been stable. Prior zio patch with 100% a fib and HR average of 88.  Today he feels better than when I first saw him.  No chest pain or SOB.  No bleeding.  His fatigue is his complaint.  He is back to driving.     Past Medical History:  Diagnosis Date  . Acute encephalopathy 12/12/2015  . Acute respiratory failure with hypoxia and hypercapnia ([place]) 12/12/2015  . Allergic rhinitis 02/13/2010   Qualifier: Diagnosis of  By: Wynona Luna   . Angina at rest [facility]) 10/22/2015  . Ataxia 08/10/2013  . Atrial fibrillation ([place]) 11/2018  . B12 deficiency 08/11/2013  . BACK PAIN, LUMBAR 01/29/2009   Qualifier: Diagnosis of  By: Wynona Luna   . BENIGN POSITIONAL VERTIGO 11/23/2009   Qualifier: Diagnosis of  By: Wynona Luna   . Chronic diastolic heart failure ([place]) 04/24/2015  . Chronic pain    left sided-Kristeins  . Coronary 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 ([place]) 11/04/2015   s/p CABG 2004; CFX DES 2011  . Essential hypertension 12/12/2006   Qualifier: Diagnosis of  By: [name] RMA, Lucy    . GERD 12/12/2006   Qualifier: Diagnosis of  By: [name] RMA, Lucy    . GOUT 12/12/2006   Qualifier: Diagnosis of  By: [name]  RMA, Lucy    . Hyperlipidemia LDL goal <70 12/12/2006   Qualifier: Diagnosis of  By: [name] RMA, Lucy     . Hypogonadism male 04/10/2011  . Hypothyroidism, acquired 02/15/2013  . Insomnia 05/21/2013  . Long term current use of anticoagulant therapy 06/24/2011  . Obstructive sleep apnea-Failed CPAP 06/17/2007   Failed CPAP Using O 2 for sleep   . Paroxysmal atrial fibrillation ([place]) 12/06/2016  . 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 Thoracic aorta atherosclerosis ([place]) 04/27/2017  . Tubular adenoma of colon 05/2010  . Type 2 diabetes mellitus without complication, without long-term current use of insulin  ([place]) 10/05/2007   Qualifier: Diagnosis of  By: Wynona Luna   . Vasovagal syncope     Past Surgical History:  Procedure Laterality Date  . CARDIAC CATHETERIZATION N/A 10/25/2015   Procedure: Left Heart Cath and Cors/Grafts Angiography;  Surgeon: Troy Sine, MD;  Location: [place] [facility];  Service: Cardiovascular;  Laterality: N/A;  . CARDIOVERSION  06/20/2011   Procedure: CARDIOVERSION;  Surgeon: Larey Dresser, MD;  Location: [facility] ENDOSCOPY;  Service: Cardiovascular;  Laterality: N/A;  . CARDIOVERSION N/A 04/26/2015   Procedure: CARDIOVERSION;  Surgeon: Dorothy Spark, MD;  Location: [facility] ENDOSCOPY;  Service: Cardiovascular;  Laterality: N/A;  . CARDIOVERSION N/A 05/10/2015   Procedure: CARDIOVERSION;  Surgeon: Larey Dresser, MD;  Location: [place];  Service: Cardiovascular;  Laterality: N/A;  . CARDIOVERSION N/A 12/10/2018   Procedure: CARDIOVERSION;  Surgeon: Sanda Klein, MD;  Location: [place];  Service: Cardiovascular;  Laterality: N/A;  . CATARACT EXTRACTION  05/2010   left eye  . CATARACT EXTRACTION  04/2010   right eye  . CORONARY ARTERY BYPASS GRAFT     stent  . ESOPHAGOGASTRODUODENOSCOPY  03-25-2005  . LEFT HEART CATH AND CORS/GRAFTS ANGIOGRAPHY N/A 02/19/2018   Procedure: LEFT HEART CATH AND CORS/GRAFTS ANGIOGRAPHY;  Surgeon: Martinique, Peter M, MD;  Location: [place] [facility];  Service: Cardiovascular;  Laterality: N/A;  . NASAL SEPTUM SURGERY    . PERCUTANEOUS PLACEMENT INTRAVASCULAR STENT CERVICAL CAROTID ARTERY     03-2009; using a drug-eluting platform of the circumflex cornoray artery with a 3.0 x 18 [employer] Promus drug-eluting platform post dilated to 3.75 with a noncompliant balloon.  . TEE WITHOUT CARDIOVERSION  06/20/2011   Procedure: TRANSESOPHAGEAL ECHOCARDIOGRAM (TEE);  Surgeon: Larey Dresser, MD;  Location: [facility] ENDOSCOPY;  Service: Cardiovascular;  Laterality: N/A;     Current Outpatient Medications  Medication Sig Dispense Refill    . acetaminophen (TYLENOL) 325 MG tablet Take 2 tablets (650 mg total) by mouth every 4 (four) hours as needed for headache or mild pain.    Marland Kitchen allopurinol (ZYLOPRIM) 100 MG tablet Take 1 tablet (100 mg total) by mouth 2 (two) times daily. 60 tablet 0  . apixaban (ELIQUIS) 5 MG TABS tablet Take 1 tablet (5 mg total) by mouth 2 (two) times daily. 60 tablet 11  . atorvastatin (LIPITOR) 40 MG tablet Take 1 tablet (40 mg total) by mouth daily. 90 tablet 3  . benazepril (LOTENSIN) 10 MG tablet Take 10 mg by mouth daily.    . blood glucose meter kit and supplies KIT Dispense based on patient and insurance preference. Use up to four times daily as directed. (FOR ICD-9 250.00, 250.01). 102 each 0  . cyanocobalamin 1000 MCG tablet Take 2,000 mcg by mouth daily.    Marland Kitchen diltiazem (CARDIZEM CD) 240 MG  24 hr capsule Take 1 capsule (240 mg total) by mouth 2 (two) times daily. 180 capsule 2  . ferrous sulfate 325 (65 FE) MG tablet Take 1 tablet (325 mg total) by mouth 2 (two) times daily with a meal. 60 tablet 3  . fluticasone (FLONASE) 50 MCG/ACT nasal spray Place 2 sprays into both nostrils as needed for allergies or rhinitis. 16 g 0  . folic acid (FOLVITE) 1 MG tablet TAKE 1 TABLET BY MOUTH EVERY DAY 90 tablet 1  . furosemide (LASIX) 40 MG tablet TAKE 1 TABLET BY MOUTH DAILY AS NEEDED FOR EDEMA (EDEMA AND WEIGHT GAIN). 90 tablet 0  . gabapentin (NEURONTIN) 100 MG capsule Take 1 capsule (100 mg total) by mouth at bedtime. 30 capsule 1  . glucose blood (ACCU-CHEK GUIDE) test strip USE AS DIRECTED UP TO 4 TIMES DAILY 100 strip 0  . levothyroxine (SYNTHROID) 112 MCG tablet Take 1 tablet (112 mcg total) by mouth daily before breakfast. 90 tablet 1  . metFORMIN (GLUCOPHAGE) 500 MG tablet Take 1 tablet (500 mg total) by mouth daily with breakfast. 90 tablet 1  . metoprolol succinate (TOPROL XL) 25 MG 24 hr tablet Take 1 tablet (25 mg total) by mouth daily. 90 tablet 3  . Multiple Vitamins-Minerals (MULTIVITAMIN GUMMIES  MENS PO) Take 2 tablets by mouth daily with breakfast.     . nitroGLYCERIN (NITROSTAT) 0.4 MG SL tablet Place 1 tablet (0.4 mg total) under the tongue every 5 (five) minutes as needed for chest pain. Up to 3 doses 10 tablet 2  . OXYGEN Inhale 2 L into the lungs at bedtime.    . pantoprazole (PROTONIX) 40 MG tablet Take 1 tablet (40 mg total) by mouth every morning. 90 tablet 1  . sennosides-docusate sodium (SENOKOT-S) 8.6-50 MG tablet Take 1 tablet by mouth as needed for constipation.    . traMADol (ULTRAM) 50 MG tablet Take 1 tablet (50 mg total) by mouth 2 (two) times daily. 180 tablet 1  . traMADol (ULTRAM-ER) 200 MG 24 hr tablet Take 1 tablet (200 mg total) by mouth daily. 90 tablet 1   No current facility-administered medications for this visit.    Allergies:   Patient has no known allergies.    Social History:  The patient  reports that he has never smoked. He has never used smokeless tobacco. He reports that he does not drink alcohol or use drugs.   Family History:  The patient's family history includes Lung cancer in his father; Stroke in his mother.    ROS:  General:no colds or fevers, no weight changes, + fatigue Skin:no rashes or ulcers HEENT:no blurred vision, no congestion CV:see HPI PUL:see HPI GI:no diarrhea constipation or melena, no indigestion GU:no hematuria, no dysuria MS:no joint pain, no claudication Neuro:no syncope, no lightheadedness Endo:+ diabetes, + thyroid disease  Wt Readings from Last 3 Encounters:  04/14/19 238 lb 1.9 oz (108 kg)  03/29/19 243 lb (110.2 kg)  03/25/19 240 lb 9.6 oz (109.1 kg)     PHYSICAL EXAM: VS:  BP 120/70   Pulse 93   Ht 6' 3"  (1.905 m)   Wt 238 lb 1.9 oz (108 kg)   SpO2 95%   BMI 29.76 kg/m  , BMI Body mass index is 29.76 kg/m. General:Pleasant affect, NAD Skin:Warm and dry, brisk capillary refill HEENT:normocephalic, sclera clear, mucus membranes moist Neck:supple, no JVD, no bruits  Heart:irreg irreg without  murmur, gallup, rub or click Lungs:clear without rales, rhonchi, or wheezes TAV:WPVX, non  tender, + BS, do not palpate liver spleen or masses Ext:no to trace lower ext edema, 2+ pedal pulses, 2+ radial pulses Neuro:alert and oriented X 3, MAE, follows commands, + facial symmetry    EKG:  EKG is ordered today. The ekg ordered today demonstrates a fib rate controlled at 83 with non specific ST abnormality but stable and no changes.    Recent Labs: 11/28/2018: TSH 1.822 12/17/2018: ALT 61 12/23/2018: B Natriuretic Peptide 361.3; Magnesium 2.4 01/26/2019: BUN 16; Creatinine, Ser 1.00; Potassium 4.4; Sodium 145 04/01/2019: Hemoglobin 13.9; Platelets 156    Lipid Panel    Component Value Date/Time   CHOL 103 12/09/2018 0530   CHOL 138 12/05/2016 1100   TRIG 54 12/09/2018 0530   HDL 43 12/09/2018 0530   HDL 46 12/05/2016 1100   CHOLHDL 2.4 12/09/2018 0530   VLDL 11 12/09/2018 0530   LDLCALC 49 12/09/2018 0530   LDLCALC 67 12/05/2016 1100       Other studies Reviewed: Additional studies/ records that were reviewed today include: . Cardiac catheterization 02/19/2018: 1. Left dominant circulation 2. Severe 3 vessel occlusive CAD. - 95% ostial LAD - 100% first diagonal - 100% ostial LCx. This is new since 2017 - was 95% prior - 100% nondominant RCA 3. Patent LIMA to the LAD. There is chronic severe disease in the apical LAD 4. Patent SVG to the first diagonal and SVG to the first OM as a Y graft. 5. Chronic occlusion of the SVG to terminal LCx 6. Normal LVEDP   ASSESSMENT AND PLAN:  1.  Atrial fib rate controlled in the 80s, on toprol XL 25 and dilt.  Pt does complain of fatigue which he feels is the a fib.- per Dr. Marlou Porch last note another DCCV would not be beneficial due to short length of time he maintained SR after last DCCV.    Discussed with pt.  Will notify Dr. Marlou Porch pt feels fatigue is due to a fib and since he is tolerating eliquis see if needs referral  back to Dr. Caryl Comes.  Follow up in 3-4 months with Dr. Marlou Porch.  2.  Anticoagulation with CHAD2S2VASc of 6 now on eliquis with no bleeding and stable hgb.  Is checked again today.   Continue eliquis.  ASA was stopped on last visit.     3.  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 Lamonte Sakai  No longer on amiodarone.   4.  Hx of CVA in 1996 with some mild lt hemiplegia walks with walker and is back to driving.  5.  CAD with hx of CABG and on last cath 1 of the 4 grafts were down.  No angina.  On statin. And BB  6.  HLD on statin last LDL 49 was in Sept, labs reviewed.  Continue lipitor   7/8 hypothyroid and DM-2 with HTN followed by his PCP.         Current medicines are reviewed with the patient today.  The patient Has no concerns regarding medicines.  The following changes have been made:  See above Labs/ tests ordered today include:see above  Disposition:   FU:  see above  Signed, Cecilie Kicks, NP  04/14/2019 1:34 PM    [facility] [place], [address] [place], [place] Phone: [phone]; Fax: [phone]

## 2019-04-14 ENCOUNTER — Ambulatory Visit (INDEPENDENT_AMBULATORY_CARE_PROVIDER_SITE_OTHER): Payer: Medicare Other | Admitting: Cardiology

## 2019-04-14 ENCOUNTER — Other Ambulatory Visit: Payer: Medicare Other | Admitting: *Deleted

## 2019-04-14 ENCOUNTER — Other Ambulatory Visit: Payer: Self-pay

## 2019-04-14 ENCOUNTER — Encounter: Payer: Self-pay | Admitting: Cardiology

## 2019-04-14 ENCOUNTER — Ambulatory Visit: Payer: Medicare Other

## 2019-04-14 VITALS — BP 120/70 | HR 93 | Ht 75.0 in | Wt 238.1 lb

## 2019-04-14 DIAGNOSIS — I6930 Unspecified sequelae of cerebral infarction: Secondary | ICD-10-CM

## 2019-04-14 DIAGNOSIS — Z9861 Coronary angioplasty status: Secondary | ICD-10-CM | POA: Diagnosis not present

## 2019-04-14 DIAGNOSIS — Z7901 Long term (current) use of anticoagulants: Secondary | ICD-10-CM

## 2019-04-14 DIAGNOSIS — E118 Type 2 diabetes mellitus with unspecified complications: Secondary | ICD-10-CM

## 2019-04-14 DIAGNOSIS — I2510 Atherosclerotic heart disease of native coronary artery without angina pectoris: Secondary | ICD-10-CM

## 2019-04-14 DIAGNOSIS — D649 Anemia, unspecified: Secondary | ICD-10-CM

## 2019-04-14 DIAGNOSIS — I4819 Other persistent atrial fibrillation: Secondary | ICD-10-CM | POA: Diagnosis not present

## 2019-04-14 LAB — CBC
Hematocrit: 39.4 % (ref 37.5–51.0)
Hemoglobin: 13.6 g/dL (ref 13.0–17.7)
MCH: 30.4 pg (ref 26.6–33.0)
MCHC: 34.5 g/dL (ref 31.5–35.7)
MCV: 88 fL (ref 79–97)
Platelets: 158 10*3/uL (ref 150–450)
RBC: 4.47 x10E6/uL (ref 4.14–5.80)
RDW: 13.9 % (ref 11.6–15.4)
WBC: 7.3 10*3/uL (ref 3.4–10.8)

## 2019-04-14 MED ORDER — FLUTICASONE PROPIONATE 50 MCG/ACT NA SUSP: 2.0000 | NASAL | 0 refills | Status: DC | PRN

## 2019-04-14 NOTE — Patient Instructions (Signed)
Medication Instructions:  Your physician recommends that you continue on your current medications as directed. Please refer to the Current Medication list given to you today.  *If you need a refill on your cardiac medications before your next appointment, please call your pharmacy*  Lab Work: None ordered  If you have labs (blood work) drawn today and your tests are completely normal, you will receive your results only by: Marland Kitchen MyChart Message (if you have MyChart) OR . A paper copy in the mail If you have any lab test that is abnormal or we need to change your treatment, we will call you to review the results.  Testing/Procedures: None orderd  Follow-Up: At [facility], you and your health needs are our priority.  As part of our continuing mission to provide you with exceptional heart care, we have created designated Provider Care Teams.  These Care Teams include your primary Cardiologist (physician) and Advanced Practice Providers (APPs -  Physician Assistants and Nurse Practitioners) who all work together to provide you with the care you need, when you need it.  Your next appointment:   06/21/2019 ARRIVE AT 11:05 TO SEE DR. Marlou Porch

## 2019-04-14 NOTE — Addendum Note (Signed)
Addended by: Gaetano Net on: 04/14/2019 02:12 PM   Modules accepted: Orders

## 2019-04-15 ENCOUNTER — Ambulatory Visit: Payer: Medicare Other

## 2019-04-17 ENCOUNTER — Other Ambulatory Visit: Payer: Self-pay | Admitting: Family

## 2019-04-18 NOTE — Telephone Encounter (Signed)
Please contact pt and ask how much benazepril he has been taking.

## 2019-04-18 NOTE — Telephone Encounter (Signed)
Melissa -- please advise benazepril 5mg  request to use with 10mg  for total of 15mg  daily.  Med history shows 5mg  RX was stopped at discharge on 11/29/18.  Please advise?

## 2019-04-21 ENCOUNTER — Other Ambulatory Visit: Payer: Self-pay

## 2019-04-21 ENCOUNTER — Ambulatory Visit: Payer: Medicare Other

## 2019-04-21 DIAGNOSIS — Z7901 Long term (current) use of anticoagulants: Secondary | ICD-10-CM

## 2019-04-21 DIAGNOSIS — D649 Anemia, unspecified: Secondary | ICD-10-CM

## 2019-04-21 NOTE — Telephone Encounter (Signed)
Patient returned my call from yesterday, reports he is taking 15 mg a day

## 2019-04-21 NOTE — Telephone Encounter (Signed)
Pt called back stating he is returning a call. I asked him below. Pt states he is taking 1 10mg  and 1 5mg  per day (total 15mg ).  Ph # [phone]

## 2019-04-21 NOTE — Progress Notes (Signed)
Pt to schedule 3 month repeat CBC at his 06/2019 appt with Dr. Marlou Porch. Lab due in 07/2019.

## 2019-04-26 ENCOUNTER — Other Ambulatory Visit: Payer: Self-pay

## 2019-04-26 ENCOUNTER — Encounter: Payer: Self-pay | Admitting: Physical Medicine & Rehabilitation

## 2019-04-26 ENCOUNTER — Encounter: Payer: Medicare Other | Attending: Physical Medicine & Rehabilitation | Admitting: Physical Medicine & Rehabilitation

## 2019-04-26 VITALS — BP 119/74 | HR 66 | Ht 72.0 in | Wt 241.0 lb

## 2019-04-26 DIAGNOSIS — G8110 Spastic hemiplegia affecting unspecified side: Secondary | ICD-10-CM | POA: Diagnosis not present

## 2019-04-26 DIAGNOSIS — G8111 Spastic hemiplegia affecting right dominant side: Secondary | ICD-10-CM | POA: Diagnosis not present

## 2019-04-26 NOTE — Progress Notes (Signed)
Botox Injection for spasticity using needle EMG guidance °  °Dilution: 50 Units/ml °Indication: Severe spasticity which interferes with ADL,mobility and/or  hygiene and is unresponsive to medication management and other conservative care °Informed consent was obtained after describing risks and benefits of the procedure with the patient. This includes bleeding, bruising, infection, excessive weakness, or medication side effects. A REMS form is on file and signed. °Needle: 27g 1" needle electrode °Number of units per muscle °  ° left FDP 25 units ° left FDS 25 units °left biceps 50 units °[id number] °All injections were done after obtaining appropriate EMG activity and after negative drawback for blood. The patient tolerated the procedure well. Post procedure instructions were given. A followup appointment was made.     °

## 2019-04-29 ENCOUNTER — Other Ambulatory Visit: Payer: Self-pay | Admitting: Family

## 2019-04-30 ENCOUNTER — Other Ambulatory Visit: Payer: Self-pay | Admitting: Family

## 2019-05-06 ENCOUNTER — Ambulatory Visit: Payer: Medicare Other

## 2019-05-06 ENCOUNTER — Other Ambulatory Visit: Payer: Self-pay | Admitting: Cardiology

## 2019-05-08 ENCOUNTER — Ambulatory Visit: Payer: Medicare Other | Attending: Internal Medicine

## 2019-05-08 DIAGNOSIS — Z23 Encounter for immunization: Secondary | ICD-10-CM | POA: Insufficient documentation

## 2019-05-08 NOTE — Progress Notes (Signed)
   Covid-19 Vaccination Clinic  Name:  [name]    MRN: [id number] DOB: 11-14-1945  05/08/2019  [name] was observed post Covid-19 immunization for 15 minutes without incidence. He was provided with Vaccine Information Sheet and instruction to access the V-Safe system.   [name] was instructed to call 911 with any severe reactions post vaccine: Marland Kitchen Difficulty breathing  . Swelling of your face and throat  . A fast heartbeat  . A bad rash all over your body  . Dizziness and weakness    Immunizations Administered    Name Date Dose VIS Date Route   Pfizer COVID-19 Vaccine 05/08/2019 10:25 AM 0.3 mL 02/25/2019 Intramuscular   Manufacturer: [place]   Lot: [id number]   [place]: [id number]

## 2019-05-17 NOTE — Telephone Encounter (Signed)
04/29/19 mychart message came back as unread.  Mailed letter.

## 2019-05-18 ENCOUNTER — Telehealth: Payer: Self-pay | Admitting: Family

## 2019-05-18 DIAGNOSIS — K649 Unspecified hemorrhoids: Secondary | ICD-10-CM

## 2019-05-18 NOTE — Telephone Encounter (Signed)
Pt is calling to see if he can be referral to a specialist Gastro for hemrroids. Please call patient to let him know. Thanks

## 2019-05-19 ENCOUNTER — Ambulatory Visit (INDEPENDENT_AMBULATORY_CARE_PROVIDER_SITE_OTHER): Payer: Medicare Other | Admitting: Nurse Practitioner

## 2019-05-19 ENCOUNTER — Other Ambulatory Visit: Payer: Self-pay

## 2019-05-19 ENCOUNTER — Encounter: Payer: Self-pay | Admitting: Nurse Practitioner

## 2019-05-19 VITALS — BP 130/76 | HR 73 | Temp 97.0°F | Ht 75.0 in | Wt 246.0 lb

## 2019-05-19 DIAGNOSIS — Z86010 Personal history of colonic polyps: Secondary | ICD-10-CM

## 2019-05-19 DIAGNOSIS — K644 Residual hemorrhoidal skin tags: Secondary | ICD-10-CM | POA: Diagnosis not present

## 2019-05-19 NOTE — Patient Instructions (Addendum)
If you are age 76 or older, your body mass index should be between 23-30. Your Body mass index is 30.75 kg/m. If this is out of the aforementioned range listed, please consider follow up with your Primary Care Provider.  If you are age 57 or younger, your body mass index should be between 19-25. Your Body mass index is 30.75 kg/m. If this is out of the aformentioned range listed, please consider follow up with your Primary Care Provider.   1. Sitz bath soak for 15 minutes 1-2 times a day as needed. 2. [place]- apply a small amount inside the anal area and to the external anal area 2-3 times daily for hemorrhoid care. 3. Follow up with Dr Fuller Plan in 2 months to discuss colon polyps 4. Call our office if symptoms get worse.    Due to recent changes in healthcare laws, you may see the results of your imaging and laboratory studies on MyChart before your provider has had a chance to review them.  We understand that in some cases there may be results that are confusing or concerning to you. Not all laboratory results come back in the same time frame and the provider may be waiting for multiple results in order to interpret others.  Please give [place] 48 hours in order for your provider to thoroughly review all the results before contacting the office for clarification of your results.   Thank you for choosing [facility] Noralyn Pick, CRNP

## 2019-05-19 NOTE — Progress Notes (Signed)
Reviewed and agree with management plan.  Ikey Omary T. Teancum Brule, MD FACG [facility]  

## 2019-05-19 NOTE — Progress Notes (Signed)
05/19/2019 [name] [id number] 01-11-1946   CHIEF COMPLAINT: External hemorrhoid  HISTORY OF PRESENT ILLNESS:  [name] is a 74 year old male with a past medical history significant for hypertension, atrial fibrillation on Eliquis, coronary artery disease status post CABG in 2004 and DES placement in 9629, diastolic heart failure, CVA 1996, diabetes mellitus type 2, thyroidism, GERD and colon polyps. He presents today with complaints of hemorrhoidal irritation. He reports having an external hemorrhoids that is often painful but does not bleed. He is mostly concerned that it is extremely difficulty to wipe clean after he passes a BM. He is extremely concerned about this inconvenience.  He would like to have surgery to have the external hemorrhoid removed.  He reports passing normal formed brown bowel movement most days.  He is taking ferrous sulfate 325 mg 1 p.o. twice daily since he was hospitalized September 2020 with atrial fibrillation complicated by a chest wall hematoma secondary to anticoagulation.  His admission hemoglobin at that time was 6.8, he was transfused with 2 units of packed red blood cells.  Since that time, his hemoglobin has remained stable at 13.3-13.6.  He is currently on Eliquis 5 mg p.o. twice daily.  His stools are darker brown since taking the ferrous sulfate.  He denies having any black-colored stools. He underwent a colonoscopy by Dr. Fuller Plan 05/22/2010 which identified a 6 mm tubular adenomatous polyp to the descending colon.  At that time, he was advised to repeat a colonoscopy in 5 years which was not done.  He denies having any family history of colorectal cancer.  History of GERD which is stable on Pantoprazole 40 mg once daily.  He received his Covid 19 vaccination on 05/15/2019.       CBC Latest Ref Rng & Units 04/14/2019 04/01/2019 03/25/2019  WBC 3.4 - 10.8 x10E3/uL 7.3 6.4 6.7  Hemoglobin 13.0 - 17.7 g/dL 13.6 13.9 13.3  Hematocrit 37.5 - 51.0 % 39.4  40.2 38.4  Platelets 150 - 450 x10E3/uL 158 156 158   CMP Latest Ref Rng & Units 01/26/2019 12/23/2018 12/17/2018  Glucose 65 - 99 mg/dL 81 128(H) 117(H)  BUN 8 - 27 mg/dL 16 15 21   Creatinine 0.76 - 1.27 mg/dL 1.00 1.06 0.96  Sodium 134 - 144 mmol/L 145(H) 137 136  Potassium 3.5 - 5.2 mmol/L 4.4 4.5 3.8  Chloride 96 - 106 mmol/L 103 100 97(L)  CO2 20 - 29 mmol/L 25 28 30   Calcium 8.6 - 10.2 mg/dL 10.0 9.1 8.5(L)  Total Protein 6.5 - 8.1 g/dL - - 5.8(L)  Total Bilirubin 0.3 - 1.2 mg/dL - - 3.8(H)  Alkaline Phos 38 - 126 U/L - - 55  AST 15 - 41 U/L - - 63(H)  ALT 0 - 44 U/L - - 61(H)    Echo 02/18/2018: Left ventricle: The cavity size was normal. There was mild focal  basal hypertrophy of the septum. Systolic function was normal. The  estimated ejection fraction was in the range of 55% to 60%. Wall  motion was normal; there were no regional wall motion  abnormalities. Doppler parameters are consistent with abnormal left  ventricular relaxation (grade 1 diastolic dysfunction). Doppler  parameters are consistent with elevated ventricular end-diastolic  filling pressure  Past Medical History:  Diagnosis Date  . Acute encephalopathy 12/12/2015  . Acute respiratory failure with hypoxia and hypercapnia ([place]) 12/12/2015  . Allergic rhinitis 02/13/2010   Qualifier: Diagnosis of  By: Wynona Luna   .  Angina at rest [facility]) 10/22/2015  . Ataxia 08/10/2013  . Atrial fibrillation ([place]) 11/2018  . B12 deficiency 08/11/2013  . BACK PAIN, LUMBAR 01/29/2009   Qualifier: Diagnosis of  By: Wynona Luna   . BENIGN POSITIONAL VERTIGO 11/23/2009   Qualifier: Diagnosis of  By: Wynona Luna   . Chronic diastolic heart failure ([place]) 04/24/2015  . Chronic pain    left sided-[place]  . Coronary 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 ([place]) 11/04/2015   s/p CABG 2004; CFX DES 2011  . Essential hypertension 12/12/2006   Qualifier: Diagnosis of  By: [name] RMA, Lucy    . GERD  12/12/2006   Qualifier: Diagnosis of  By: [name] RMA, Lucy    . GOUT 12/12/2006   Qualifier: Diagnosis of  By: Reatha Armour, Lucy    . Hyperlipidemia LDL goal <70 12/12/2006   Qualifier: Diagnosis of  By: [name] RMA, Lucy     . Hypogonadism male 04/10/2011  . Hypothyroidism, acquired 02/15/2013  . Insomnia 05/21/2013  . Long term current use of anticoagulant therapy 06/24/2011  . Obstructive sleep apnea-Failed CPAP 06/17/2007   Failed CPAP Using O 2 for sleep   . Paroxysmal atrial fibrillation ([place]) 12/06/2016  . Stroke (cerebrum) ([place]) 01/13/2017   also 1996; resultant hemiplegia of dominant side  . Thoracic aorta atherosclerosis ([place]) 04/27/2017  . Tubular adenoma of colon 05/2010  . Type 2 diabetes mellitus without complication, without long-term current use of insulin ([place]) 10/05/2007   Qualifier: Diagnosis of  By: Wynona Luna   . Vasovagal syncope    Past Surgical History:  Procedure Laterality Date  . CARDIAC CATHETERIZATION N/A 10/25/2015   Procedure: Left Heart Cath and Cors/Grafts Angiography;  Surgeon: Troy Sine, MD;  Location: [place] CV LAB;  Service: Cardiovascular;  Laterality: N/A;  . CARDIOVERSION  06/20/2011   Procedure: CARDIOVERSION;  Surgeon: Larey Dresser, MD;  Location: [facility] ENDOSCOPY;  Service: Cardiovascular;  Laterality: N/A;  . CARDIOVERSION N/A 04/26/2015   Procedure: CARDIOVERSION;  Surgeon: Dorothy Spark, MD;  Location: [facility] ENDOSCOPY;  Service: Cardiovascular;  Laterality: N/A;  . CARDIOVERSION N/A 05/10/2015   Procedure: CARDIOVERSION;  Surgeon: Larey Dresser, MD;  Location: [place];  Service: Cardiovascular;  Laterality: N/A;  . CARDIOVERSION N/A 12/10/2018   Procedure: CARDIOVERSION;  Surgeon: Sanda Klein, MD;  Location: [place];  Service: Cardiovascular;  Laterality: N/A;  . CATARACT EXTRACTION  05/2010   left eye  . CATARACT EXTRACTION  04/2010   right eye  . CORONARY ARTERY BYPASS GRAFT     stent  . ESOPHAGOGASTRODUODENOSCOPY  03-25-2005    . LEFT HEART CATH AND CORS/GRAFTS ANGIOGRAPHY N/A 02/19/2018   Procedure: LEFT HEART CATH AND CORS/GRAFTS ANGIOGRAPHY;  Surgeon: Martinique, Peter M, MD;  Location: [place] CV LAB;  Service: Cardiovascular;  Laterality: N/A;  . NASAL SEPTUM SURGERY    . PERCUTANEOUS PLACEMENT INTRAVASCULAR STENT CERVICAL CAROTID ARTERY     03-2009; using a drug-eluting platform of the circumflex cornoray artery with a 3.0 x 18 [employer] Promus drug-eluting platform post dilated to 3.75 with a noncompliant balloon.  . TEE WITHOUT CARDIOVERSION  06/20/2011   Procedure: TRANSESOPHAGEAL ECHOCARDIOGRAM (TEE);  Surgeon: Larey Dresser, MD;  Location: [facility] ENDOSCOPY;  Service: Cardiovascular;  Laterality: N/A;    reports that he has never smoked. He has never used smokeless tobacco. He reports that he does not drink alcohol or use drugs. family history includes Lung cancer in  his father; Stroke in his mother. No Known Allergies    Outpatient Encounter Medications as of 05/19/2019  Medication Sig  . acetaminophen (TYLENOL) 325 MG tablet Take 2 tablets (650 mg total) by mouth every 4 (four) hours as needed for headache or mild pain.  Marland Kitchen allopurinol (ZYLOPRIM) 100 MG tablet Take 1 tablet (100 mg total) by mouth 2 (two) times daily.  Marland Kitchen apixaban (ELIQUIS) 5 MG TABS tablet Take 1 tablet (5 mg total) by mouth 2 (two) times daily.  Marland Kitchen atorvastatin (LIPITOR) 40 MG tablet Take 1 tablet (40 mg total) by mouth daily.  . benazepril (LOTENSIN) 10 MG tablet Take 10 mg by mouth daily.  . benazepril (LOTENSIN) 5 MG tablet TAKE 1 TABLET BY MOUTH DAILY WITH 10MG OF BENAZEPRIL TO EQUAL 15MG DAILY  . blood glucose meter kit and supplies KIT Dispense based on patient and insurance preference. Use up to four times daily as directed. (FOR ICD-9 250.00, 250.01).  . cyanocobalamin 1000 MCG tablet Take 2,000 mcg by mouth daily.  Marland Kitchen diltiazem (CARDIZEM CD) 240 MG 24 hr capsule Take 1 capsule (240 mg total) by mouth 2 (two) times daily.  .  ferrous sulfate 325 (65 FE) MG tablet Take 1 tablet (325 mg total) by mouth 2 (two) times daily with a meal.  . fluticasone (FLONASE) 50 MCG/ACT nasal spray Place 2 sprays into both nostrils as needed for allergies or rhinitis.  . folic acid (FOLVITE) 1 MG tablet TAKE 1 TABLET BY MOUTH EVERY DAY  . furosemide (LASIX) 40 MG tablet TAKE 1 TABLET BY MOUTH DAILY AS NEEDED FOR EDEMA (EDEMA AND WEIGHT GAIN).  Marland Kitchen gabapentin (NEURONTIN) 100 MG capsule TAKE 1 CAPSULE BY MOUTH AT BEDTIME.  Marland Kitchen glucose blood (ACCU-CHEK GUIDE) test strip USE AS DIRECTED UP TO 4 TIMES DAILY  . levothyroxine (SYNTHROID) 112 MCG tablet Take 1 tablet (112 mcg total) by mouth daily before breakfast.  . metFORMIN (GLUCOPHAGE) 500 MG tablet Take 1 tablet (500 mg total) by mouth daily with breakfast.  . metoprolol succinate (TOPROL XL) 25 MG 24 hr tablet Take 1 tablet (25 mg total) by mouth daily.  . Multiple Vitamins-Minerals (MULTIVITAMIN GUMMIES MENS PO) Take 2 tablets by mouth daily with breakfast.   . nitroGLYCERIN (NITROSTAT) 0.4 MG SL tablet Place 1 tablet (0.4 mg total) under the tongue every 5 (five) minutes as needed for chest pain. Up to 3 doses  . OXYGEN Inhale 2 L into the lungs at bedtime.  . pantoprazole (PROTONIX) 40 MG tablet Take 1 tablet (40 mg total) by mouth every morning.  . sennosides-docusate sodium (SENOKOT-S) 8.6-50 MG tablet Take 1 tablet by mouth as needed for constipation.  . traMADol (ULTRAM) 50 MG tablet Take 1 tablet (50 mg total) by mouth 2 (two) times daily.  . traMADol (ULTRAM-ER) 200 MG 24 hr tablet Take 1 tablet (200 mg total) by mouth daily.   No facility-administered encounter medications on file as of 05/19/2019.     REVIEW OF SYSTEMS: All other systems reviewed and negative except where noted in the History of Present Illness.   PHYSICAL EXAM: BP 130/76   Pulse 73   Temp (!) 97 F (36.1 C)   Ht 6' 3"  (1.905 m)   Wt 246 lb (111.6 kg)   BMI 30.75 kg/m  General: Frail appearing  74 year old male with a slow gait in no acute distress. Head: Normocephalic and atraumatic. Eyes:  Sclerae non-icteric, conjunctive pink. Ears: Normal auditory acuity. Mouth: No ulcers or lesions.  Neck:  Supple, no lymphadenopathy or thyromegaly.  Lungs: Clear bilaterally to auscultation without wheezes, crackles or rhonchi. Heart: Irregular rate and rhythm. No murmur, rub or gallop appreciated.  Abdomen: Soft, nontender, non distended. No masses. No hepatosplenomegaly. Normoactive bowel sounds x 4 quadrants.  Rectal: A moderate sized noninflamed posterior external hemorrhoid, nontender without evidence of active bleeding.  Light brown stool guaiac negative.  Medical assistant Olivia Mackie present during exam. Musculoskeletal: Symmetrical with no gross deformities. Skin: Warm and dry. No rash or lesions on visible extremities. Extremities: No edema. Neurological: Alert oriented x 4, no focal deficits.  Psychological:  Alert and cooperative. Normal mood and affect.  ASSESSMENT AND PLAN:  9.  74 year old male with complaints of external hemorrhoidal irritation with hygiene concerns.  The patient would like to have eventual hemorrhoidectomy surgery.  I discussed with the patient the benefits of external hemorrhoidectomy surgery do not outweigh the risks at this time. -I discussed the use of diaper wipes for anal care after defecation, sitz bath as tolerated -The patient elects to talk to his cardiologist about future hemorrhoid surgery and cardiac clearance  2.  Atrial fibrillation on Eliquis  3.  History of CAD, status post CABG 2004, DES x 1 in 2011, CHF  4.  History of atrial fibrillation on Eliquis  5.  History of a tubular adenomatous colon polyp -Patient to follow-up with Dr. Fuller Plan in 2 months to further discuss any hemorrhoidal intervention and to discuss appropriate colon polyp follow-up  6.  History of a CVA  7.  Diabetes mellitus II  8.  GERD, stable on Pantoprazole 40 mg  daily    CC:  Debbrah Alar, NP

## 2019-05-23 ENCOUNTER — Other Ambulatory Visit: Payer: Self-pay | Admitting: Family

## 2019-05-25 ENCOUNTER — Encounter: Payer: Self-pay | Admitting: Family

## 2019-05-25 ENCOUNTER — Ambulatory Visit (INDEPENDENT_AMBULATORY_CARE_PROVIDER_SITE_OTHER): Payer: Medicare Other | Admitting: Family

## 2019-05-25 ENCOUNTER — Other Ambulatory Visit: Payer: Self-pay

## 2019-05-25 VITALS — BP 124/62 | HR 65 | Temp 96.3°F | Resp 16 | Ht 75.0 in | Wt 244.0 lb

## 2019-05-25 DIAGNOSIS — E119 Type 2 diabetes mellitus without complications: Secondary | ICD-10-CM

## 2019-05-25 DIAGNOSIS — E611 Iron deficiency: Secondary | ICD-10-CM | POA: Diagnosis not present

## 2019-05-25 DIAGNOSIS — I10 Essential (primary) hypertension: Secondary | ICD-10-CM | POA: Diagnosis not present

## 2019-05-25 DIAGNOSIS — M109 Gout, unspecified: Secondary | ICD-10-CM

## 2019-05-25 DIAGNOSIS — I2510 Atherosclerotic heart disease of native coronary artery without angina pectoris: Secondary | ICD-10-CM

## 2019-05-25 DIAGNOSIS — E039 Hypothyroidism, unspecified: Secondary | ICD-10-CM | POA: Diagnosis not present

## 2019-05-25 DIAGNOSIS — Z9861 Coronary angioplasty status: Secondary | ICD-10-CM | POA: Diagnosis not present

## 2019-05-25 LAB — BASIC METABOLIC PANEL
BUN: 23 mg/dL (ref 6–23)
CO2: 35 mEq/L — ABNORMAL HIGH (ref 19–32)
Calcium: 9.6 mg/dL (ref 8.4–10.5)
Chloride: 102 mEq/L (ref 96–112)
Creatinine, Ser: 1.18 mg/dL (ref 0.40–1.50)
GFR: 60.01 mL/min (ref >60.00)
Glucose, Bld: 149 mg/dL — ABNORMAL HIGH (ref 70–99)
Potassium: 4 mEq/L (ref 3.5–5.1)
Sodium: 140 mEq/L (ref 135–145)

## 2019-05-25 LAB — TSH: TSH: 1.55 u[IU]/mL (ref 0.35–4.50)

## 2019-05-25 LAB — IRON: Iron: 116 ug/dL (ref 42–165)

## 2019-05-25 LAB — FERRITIN: Ferritin: 141.8 ng/mL (ref 22.0–322.0)

## 2019-05-25 LAB — HEMOGLOBIN A1C: Hgb A1c MFr Bld: 5.9 % (ref 4.6–6.5)

## 2019-05-25 MED ORDER — LEVOTHYROXINE SODIUM 112 MCG PO TABS: 112.0000 ug | ORAL_TABLET | Freq: Every day | ORAL | 1 refills | Status: DC

## 2019-05-25 MED ORDER — METFORMIN HCL 500 MG PO TABS: 500.0000 mg | ORAL_TABLET | Freq: Every day | ORAL | 1 refills | Status: DC

## 2019-05-25 MED ORDER — PANTOPRAZOLE SODIUM 40 MG PO TBEC: 40.0000 mg | DELAYED_RELEASE_TABLET | Freq: Every morning | ORAL | 1 refills | Status: DC

## 2019-05-25 MED ORDER — FOLIC ACID 1 MG PO TABS: 1.0000 mg | ORAL_TABLET | Freq: Every day | ORAL | 1 refills | Status: DC

## 2019-05-25 MED ORDER — FERROUS SULFATE 325 (65 FE) MG PO TABS: 325.0000 mg | ORAL_TABLET | Freq: Two times a day (BID) | ORAL | 1 refills | Status: DC

## 2019-05-25 MED ORDER — GABAPENTIN 100 MG PO CAPS: 100.0000 mg | ORAL_CAPSULE | Freq: Every day | ORAL | 1 refills | Status: DC

## 2019-05-25 MED ORDER — FUROSEMIDE 40 MG PO TABS: ORAL_TABLET | ORAL | 1 refills | Status: DC

## 2019-05-25 MED ORDER — BENAZEPRIL HCL 10 MG PO TABS: 10.0000 mg | ORAL_TABLET | Freq: Every day | ORAL | 1 refills | Status: DC

## 2019-05-25 NOTE — Progress Notes (Signed)
Subjective:    Patient ID: [name], male    DOB: November 29, 1945, 74 y.o.   MRN: [id number]  HPI   Patient is a 74 yr old male who presents today for follow up.  DM2- reports that he has generalized fatigue.  He attributes to his AF. Reports sugars 97-120.  He is maintained on metformin.   Lab Results  Component Value Date   HGBA1C 6.9 (H) 11/28/2018   HGBA1C 6.7 (H) 05/11/2018   HGBA1C 6.7 (H) 01/27/2018   Lab Results  Component Value Date   MICROALBUR <0.7 09/26/2014   LDLCALC 49 12/09/2018   CREATININE 1.00 01/26/2019   HTN- maintained on lotensin, diltiazem. lotensin 67m + 547monce daily.  BP Readings from Last 3 Encounters:  05/25/19 124/62  05/19/19 130/76  04/26/19 119/74   Gout-  Hyperlipidemia- maintained  On atorvastatin.   Lab Results  Component Value Date   CHOL 103 12/09/2018   HDL 43 12/09/2018   LDLCALC 49 12/09/2018   TRIG 54 12/09/2018   CHOLHDL 2.4 12/09/2018   Hypothyroid- reports feeling well on synthroid. Lab Results  Component Value Date   TSH 1.822 11/28/2018   Gout- reports no recent flares. Continues allopurinol.     Review of Systems See HPI  Past Medical History:  Diagnosis Date  . Acute encephalopathy 12/12/2015  . Acute respiratory failure with hypoxia and hypercapnia (HC[facility]9/27/2017  . Allergic rhinitis 02/13/2010   Qualifier: Diagnosis of  By: YoWynona Luna . [facility]  . Ataxia 08/10/2013  . Atrial fibrillation (HCF[place]09/2020  . B12 deficiency 08/11/2013  . BACK PAIN, LUMBAR 01/29/2009   Qualifier: Diagnosis of  By: YoWynona Luna . BENIGN POSITIONAL VERTIGO 11/23/2009   Qualifier: Diagnosis of  By: YoWynona Luna . Chronic diastolic heart failure (HC[facility]2/09/2015  . Chronic pain    left sided-[place]  . Coronary 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 (HCS[facility]ring [place]08/20/2017   s/p CABG 2004; CFX DES 2011  . Essential hypertension 12/12/2006   Qualifier: Diagnosis of   By: BrMarca AnconaMA, Lucy    . GERD 12/12/2006   Qualifier: Diagnosis of  By: BrMarca AnconaMA, Lucy    . GOUT 12/12/2006   Qualifier: Diagnosis of  By: BrReatha ArmourLucy    . Hyperlipidemia LDL goal <70 12/12/2006   Qualifier: Diagnosis of  By: BrMarca AnconaMA, Lucy     . Hypogonadism male 04/10/2011  . Hypothyroidism, acquired 02/15/2013  . Insomnia 05/21/2013  . Long term current use of anticoagulant therapy 06/24/2011  . Obstructive sleep apnea-Failed CPAP 06/17/2007   Failed CPAP Using O 2 for sleep   . Paroxysmal atrial fibrillation (HCF[facility] [place]9/22/2018  . Stroke (cerebrum) (HCS[place]10/30/2018   also 1996; resultant hemiplegia of dominant side  . Thoracic aorta atherosclerosis (HC[facility]2/01/2018  . Tubular adenoma of colon 05/2010  . Type 2 diabetes mellitus without complication, without long-term current use of insulin (HCSab[facility] [place]7/21/2009   Qualifier: Diagnosis of  By: YoWynona Luna . Vasovagal syncope      Social History   Socioeconomic History  . Marital status: Married    Spouse name: GlPeter Congo. Number of children: Not on file  . Years of education: 1464. Highest education level: Not on file  Occupational History  . Occupation: retired    EmFish farm managerOTHER    Comment: aiMining engineerTobacco Use  .  Smoking status: Never Smoker  . Smokeless tobacco: Never Used  Substance and Sexual Activity  . Alcohol use: No    Alcohol/week: 0.0 standard drinks    Comment: 1 beer a month  . Drug use: No  . Sexual activity: Not Currently  Other Topics Concern  . Not on file  Social History Narrative   Pt lives with wife. Does have stairs, but patient doesn't use them. Pt has completed technical school   Social Determinants of Health   Financial Resource Strain:   . Difficulty of Paying Living Expenses: Not on file  Food Insecurity:   . Worried About Charity fundraiser in the Last Year: Not on file  . Ran Out of Food in the Last Year: Not on file  Transportation Needs:   . Lack of Transportation (Medical):  Not on file  . Lack of Transportation (Non-Medical): Not on file  Physical Activity:   . Days of Exercise per Week: Not on file  . Minutes of Exercise per Session: Not on file  Stress:   . Feeling of Stress : Not on file  Social Connections:   . Frequency of Communication with Friends and Family: Not on file  . Frequency of Social Gatherings with Friends and Family: Not on file  . Attends Religious Services: Not on file  . Active Member of Clubs or Organizations: Not on file  . Attends Archivist Meetings: Not on file  . Marital Status: Not on file  Intimate Partner Violence:   . Fear of Current or Ex-Partner: Not on file  . Emotionally Abused: Not on file  . Physically Abused: Not on file  . Sexually Abused: Not on file    Past Surgical History:  Procedure Laterality Date  . CARDIAC CATHETERIZATION N/A 10/25/2015   Procedure: Left Heart Cath and Cors/Grafts Angiography;  Surgeon: Troy Sine, MD;  Location: [place] [facility];  Service: Cardiovascular;  Laterality: N/A;  . CARDIOVERSION  06/20/2011   Procedure: CARDIOVERSION;  Surgeon: Larey Dresser, MD;  Location: [facility] ENDOSCOPY;  Service: Cardiovascular;  Laterality: N/A;  . CARDIOVERSION N/A 04/26/2015   Procedure: CARDIOVERSION;  Surgeon: Dorothy Spark, MD;  Location: [facility] ENDOSCOPY;  Service: Cardiovascular;  Laterality: N/A;  . CARDIOVERSION N/A 05/10/2015   Procedure: CARDIOVERSION;  Surgeon: Larey Dresser, MD;  Location: [place];  Service: Cardiovascular;  Laterality: N/A;  . CARDIOVERSION N/A 12/10/2018   Procedure: CARDIOVERSION;  Surgeon: Sanda Klein, MD;  Location: [place];  Service: Cardiovascular;  Laterality: N/A;  . CATARACT EXTRACTION  05/2010   left eye  . CATARACT EXTRACTION  04/2010   right eye  . CORONARY ARTERY BYPASS GRAFT     stent  . ESOPHAGOGASTRODUODENOSCOPY  03-25-2005  . LEFT HEART CATH AND CORS/GRAFTS ANGIOGRAPHY N/A 02/19/2018   Procedure: LEFT HEART CATH AND CORS/GRAFTS  ANGIOGRAPHY;  Surgeon: Martinique, Peter M, MD;  Location: [place] [facility];  Service: Cardiovascular;  Laterality: N/A;  . NASAL SEPTUM SURGERY    . PERCUTANEOUS PLACEMENT INTRAVASCULAR STENT CERVICAL CAROTID ARTERY     03-2009; using a drug-eluting platform of the circumflex cornoray artery with a 3.0 x 18 [employer] Promus drug-eluting platform post dilated to 3.75 with a noncompliant balloon.  . TEE WITHOUT CARDIOVERSION  06/20/2011   Procedure: TRANSESOPHAGEAL ECHOCARDIOGRAM (TEE);  Surgeon: Larey Dresser, MD;  Location[facility] ENDOSCOPY;  Service: Cardiovascular;  Laterality: N/A;    Family History  Problem Relation Age of Onset  . Lung cancer  Father        deceased  . Stroke Mother        deceased-MINISTROKES    No Known Allergies  Current Outpatient Medications on File Prior to Visit  Medication Sig Dispense Refill  . acetaminophen (TYLENOL) 325 MG tablet Take 2 tablets (650 mg total) by mouth every 4 (four) hours as needed for headache or mild pain.    Marland Kitchen allopurinol (ZYLOPRIM) 100 MG tablet TAKE 2 TABLETS BY MOUTH EVERY DAY 180 tablet 1  . apixaban (ELIQUIS) 5 MG TABS tablet Take 1 tablet (5 mg total) by mouth 2 (two) times daily. 60 tablet 11  . atorvastatin (LIPITOR) 40 MG tablet Take 1 tablet (40 mg total) by mouth daily. 90 tablet 3  . benazepril (LOTENSIN) 10 MG tablet Take 10 mg by mouth daily.    . benazepril (LOTENSIN) 5 MG tablet TAKE 1 TABLET BY MOUTH DAILY WITH 10MG OF BENAZEPRIL TO EQUAL 15MG DAILY 90 tablet 1  . blood glucose meter kit and supplies KIT Dispense based on patient and insurance preference. Use up to four times daily as directed. (FOR ICD-9 250.00, 250.01). 102 each 0  . cyanocobalamin 1000 MCG tablet Take 2,000 mcg by mouth daily.    Marland Kitchen diltiazem (CARDIZEM CD) 240 MG 24 hr capsule Take 1 capsule (240 mg total) by mouth 2 (two) times daily. 180 capsule 2  . ferrous sulfate 325 (65 FE) MG tablet Take 1 tablet (325 mg total) by mouth 2 (two) times daily  with a meal. 60 tablet 3  . fluticasone (FLONASE) 50 MCG/ACT nasal spray Place 2 sprays into both nostrils as needed for allergies or rhinitis. 16 g 0  . folic acid (FOLVITE) 1 MG tablet TAKE 1 TABLET BY MOUTH EVERY DAY 30 tablet 0  . furosemide (LASIX) 40 MG tablet TAKE 1 TABLET BY MOUTH DAILY AS NEEDED FOR EDEMA (EDEMA AND WEIGHT GAIN). 90 tablet 0  . gabapentin (NEURONTIN) 100 MG capsule TAKE 1 CAPSULE BY MOUTH AT BEDTIME. 30 capsule 1  . glucose blood (ACCU-CHEK GUIDE) test strip USE AS DIRECTED UP TO 4 TIMES DAILY 100 strip 0  . levothyroxine (SYNTHROID) 112 MCG tablet Take 1 tablet (112 mcg total) by mouth daily before breakfast. 90 tablet 1  . metFORMIN (GLUCOPHAGE) 500 MG tablet Take 1 tablet (500 mg total) by mouth daily with breakfast. 90 tablet 1  . metoprolol succinate (TOPROL XL) 25 MG 24 hr tablet Take 1 tablet (25 mg total) by mouth daily. 90 tablet 3  . Multiple Vitamins-Minerals (MULTIVITAMIN GUMMIES MENS PO) Take 2 tablets by mouth daily with breakfast.     . nitroGLYCERIN (NITROSTAT) 0.4 MG SL tablet Place 1 tablet (0.4 mg total) under the tongue every 5 (five) minutes as needed for chest pain. Up to 3 doses 10 tablet 2  . OXYGEN Inhale 2 L into the lungs at bedtime.    . pantoprazole (PROTONIX) 40 MG tablet Take 1 tablet (40 mg total) by mouth every morning. 90 tablet 1  . sennosides-docusate sodium (SENOKOT-S) 8.6-50 MG tablet Take 1 tablet by mouth as needed for constipation.    . traMADol (ULTRAM) 50 MG tablet Take 1 tablet (50 mg total) by mouth 2 (two) times daily. 180 tablet 1  . traMADol (ULTRAM-ER) 200 MG 24 hr tablet Take 1 tablet (200 mg total) by mouth daily. 90 tablet 1   No current facility-administered medications on file prior to visit.    BP 124/62 (BP Location: Right Arm, Patient Position:  Sitting, Cuff Size: Large)   Pulse 65   Temp (!) 96.3 F (35.7 C) (Temporal)   Resp 16   Ht 6' 3" (1.905 m)   Wt 244 lb (110.7 kg)   SpO2 99%   BMI 30.50 kg/m        Objective:   Physical Exam Constitutional:      General: He is not in acute distress.    Appearance: He is well-developed.  HENT:     Head: Normocephalic and atraumatic.  Cardiovascular:     Rate and Rhythm: Normal rate and regular rhythm.     Heart sounds: No murmur.  Pulmonary:     Effort: Pulmonary effort is normal. No respiratory distress.     Breath sounds: Normal breath sounds. No wheezing or rales.  Skin:    General: Skin is warm and dry.  Neurological:     Mental Status: He is alert and oriented to person, place, and time.  Psychiatric:        Behavior: Behavior normal.        Thought Content: Thought content normal.           Assessment & Plan:  Diabetes type 2-clinically stable.  Has good home readings.  Will obtain follow-up A1c and creatinine today.  Continue Metformin.  Hypertension-blood pressures well controlled on current regimen.  Continue same.  Hypothyroid-clinically stable on Synthroid.  Will obtain follow-up TSH.  Gout-no recent flares.  Continue allopurinol.  Iron deficiency-we will check serum iron and ferritin.  This visit occurred during the SARS-CoV-2 public health emergency.  Safety protocols were in place, including screening questions prior to the visit, additional usage of staff PPE, and extensive cleaning of exam room while observing appropriate contact time as indicated for disinfecting solutions.

## 2019-05-25 NOTE — Patient Instructions (Signed)
Please complete lab work prior to leaving.   

## 2019-06-01 ENCOUNTER — Ambulatory Visit: Payer: Medicare Other | Attending: Internal Medicine

## 2019-06-01 DIAGNOSIS — Z23 Encounter for immunization: Secondary | ICD-10-CM

## 2019-06-01 NOTE — Progress Notes (Signed)
   Covid-19 Vaccination Clinic  Name:  [name]    MRN: [id number] DOB: January 23, 1946  06/01/2019  [name] was observed post Covid-19 immunization for 15 minutes without incident. He was provided with Vaccine Information Sheet and instruction to access the V-Safe system.   [name] was instructed to call 911 with any severe reactions post vaccine: Marland Kitchen Difficulty breathing  . Swelling of face and throat  . A fast heartbeat  . A bad rash all over body  . Dizziness and weakness   Immunizations Administered    Name Date Dose VIS Date Route   Pfizer COVID-19 Vaccine 06/01/2019  8:42 AM 0.3 mL 02/25/2019 Intramuscular   Manufacturer: [place]   Lot: [id number]   [place]: [id number]

## 2019-06-06 ENCOUNTER — Other Ambulatory Visit: Payer: Self-pay | Admitting: Cardiology

## 2019-06-07 ENCOUNTER — Encounter: Payer: Medicare Other | Attending: Physical Medicine & Rehabilitation | Admitting: Physical Medicine & Rehabilitation

## 2019-06-07 DIAGNOSIS — G8111 Spastic hemiplegia affecting right dominant side: Secondary | ICD-10-CM | POA: Insufficient documentation

## 2019-06-07 NOTE — Telephone Encounter (Signed)
Hi!  I may have filled these with a hospital discharge, I  do not mind refilling but probably should come from PCP.  Thanks.

## 2019-06-21 ENCOUNTER — Ambulatory Visit (INDEPENDENT_AMBULATORY_CARE_PROVIDER_SITE_OTHER): Payer: Medicare Other | Admitting: Cardiology

## 2019-06-21 ENCOUNTER — Other Ambulatory Visit: Payer: Self-pay

## 2019-06-21 ENCOUNTER — Encounter: Payer: Self-pay | Admitting: Cardiology

## 2019-06-21 VITALS — BP 106/60 | HR 76 | Ht 75.0 in | Wt 246.6 lb

## 2019-06-21 DIAGNOSIS — I4819 Other persistent atrial fibrillation: Secondary | ICD-10-CM | POA: Diagnosis not present

## 2019-06-21 DIAGNOSIS — I2510 Atherosclerotic heart disease of native coronary artery without angina pectoris: Secondary | ICD-10-CM | POA: Diagnosis not present

## 2019-06-21 DIAGNOSIS — Z9861 Coronary angioplasty status: Secondary | ICD-10-CM

## 2019-06-21 DIAGNOSIS — I6930 Unspecified sequelae of cerebral infarction: Secondary | ICD-10-CM | POA: Diagnosis not present

## 2019-06-21 DIAGNOSIS — I25810 Atherosclerosis of coronary artery bypass graft(s) without angina pectoris: Secondary | ICD-10-CM | POA: Diagnosis not present

## 2019-06-21 NOTE — Progress Notes (Signed)
Cardiology Office Note:    Date:  06/21/2019   ID:  [name], DOB June 23, 1945, MRN [id number]  PCP:  Debbrah Alar, NP  Cardiologist:  Candee Furbish, MD  Electrophysiologist:  None   Referring MD: Debbrah Alar, NP     History of Present Illness:    [name] is a 74 y.o. male here for the follow-up of coronary artery disease.  CABG with 3 of 4 grafts patent, chronic diastolic heart failure, residual left-sided weakness from stroke in 1996.  Had a significant chest wall bleed where Eliquis was stopped.  This was following a DC cardioversion.  This was unsuccessful.  Had return to atrial fibrillation.  CHA2DS2-VASc 6-chest wall hematoma.  Cardiac catheterization 02/19/2018: 1. Left dominant circulation 2. Severe 3 vessel occlusive CAD. - 95% ostial LAD - 100% first diagonal - 100% ostial LCx. This is new since 2017 - was 95% prior - 100% nondominant RCA 3. Patent LIMA to the LAD. There is chronic severe disease in the apical LAD 4. Patent SVG to the first diagonal and SVG to the first OM as a Y graft. 5. Chronic occlusion of the SVG to terminal LCx 6. Normal LVEDP  He still states he has no energy.  He still says that there seems to be a light at the end of the tunnel that he can see if he was in normal rhythm.  Please see below, had lengthy discussion.  Past Medical History:  Diagnosis Date  . Acute encephalopathy 12/12/2015  . Acute respiratory failure with hypoxia and hypercapnia ([place]) 12/12/2015  . Allergic rhinitis 02/13/2010   Qualifier: Diagnosis of  By: Wynona Luna   . Angina at rest [facility]) 10/22/2015  . Ataxia 08/10/2013  . Atrial fibrillation ([place]) 11/2018  . B12 deficiency 08/11/2013  . BACK PAIN, LUMBAR 01/29/2009   Qualifier: Diagnosis of  By: Wynona Luna   . BENIGN POSITIONAL VERTIGO 11/23/2009   Qualifier: Diagnosis of  By: Wynona Luna   . Chronic diastolic heart failure ([place]) 04/24/2015  . Chronic pain    left  sided-[place]  . Coronary 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 ([place]) 11/04/2015   s/p CABG 2004; CFX DES 2011  . Essential hypertension 12/12/2006   Qualifier: Diagnosis of  By: [name] RMA, Lucy    . GERD 12/12/2006   Qualifier: Diagnosis of  By: [name] RMA, Lucy    . GOUT 12/12/2006   Qualifier: Diagnosis of  By: Reatha Armour, Lucy    . Hyperlipidemia LDL goal <70 12/12/2006   Qualifier: Diagnosis of  By: [name] RMA, Lucy     . Hypogonadism male 04/10/2011  . Hypothyroidism, acquired 02/15/2013  . Insomnia 05/21/2013  . Long term current use of anticoagulant therapy 06/24/2011  . Obstructive sleep apnea-Failed CPAP 06/17/2007   Failed CPAP Using O 2 for sleep   . Paroxysmal atrial fibrillation ([place]) 12/06/2016  . Stroke (cerebrum) ([place]) 01/13/2017   also 1996; resultant hemiplegia of dominant side  . Thoracic aorta atherosclerosis ([place]) 04/27/2017  . Tubular adenoma of colon 05/2010  . Type 2 diabetes mellitus without complication, without long-term current use of insulin ([name]) 10/05/2007   Qualifier: Diagnosis of  By: Wynona Luna   . Vasovagal syncope     Past Surgical History:  Procedure Laterality Date  . CARDIAC CATHETERIZATION N/A 10/25/2015   Procedure: Left Heart Cath and Cors/Grafts Angiography;  Surgeon: Troy Sine, MD;  Location: [place] CV  LAB;  Service: Cardiovascular;  Laterality: N/A;  . CARDIOVERSION  06/20/2011   Procedure: CARDIOVERSION;  Surgeon: Larey Dresser, MD;  Location: [facility] ENDOSCOPY;  Service: Cardiovascular;  Laterality: N/A;  . CARDIOVERSION N/A 04/26/2015   Procedure: CARDIOVERSION;  Surgeon: Dorothy Spark, MD;  Location: [facility] ENDOSCOPY;  Service: Cardiovascular;  Laterality: N/A;  . CARDIOVERSION N/A 05/10/2015   Procedure: CARDIOVERSION;  Surgeon: Larey Dresser, MD;  Location: [place];  Service: Cardiovascular;  Laterality: N/A;  . CARDIOVERSION N/A 12/10/2018   Procedure: CARDIOVERSION;  Surgeon: Sanda Klein, MD;  Location:  [place];  Service: Cardiovascular;  Laterality: N/A;  . CATARACT EXTRACTION  05/2010   left eye  . CATARACT EXTRACTION  04/2010   right eye  . CORONARY ARTERY BYPASS GRAFT     stent  . ESOPHAGOGASTRODUODENOSCOPY  03-25-2005  . LEFT HEART CATH AND CORS/GRAFTS ANGIOGRAPHY N/A 02/19/2018   Procedure: LEFT HEART CATH AND CORS/GRAFTS ANGIOGRAPHY;  Surgeon: Martinique, Peter M, MD;  Location: [place] CV LAB;  Service: Cardiovascular;  Laterality: N/A;  . NASAL SEPTUM SURGERY    . PERCUTANEOUS PLACEMENT INTRAVASCULAR STENT CERVICAL CAROTID ARTERY     03-2009; using a drug-eluting platform of the circumflex cornoray artery with a 3.0 x 18 [employer] Promus drug-eluting platform post dilated to 3.75 with a noncompliant balloon.  . TEE WITHOUT CARDIOVERSION  06/20/2011   Procedure: TRANSESOPHAGEAL ECHOCARDIOGRAM (TEE);  Surgeon: Larey Dresser, MD;  Location: [facility] ENDOSCOPY;  Service: Cardiovascular;  Laterality: N/A;    Current Medications: Current Meds  Medication Sig  . Accu-Chek FastClix Lancets MISC USE TO TEST 3 TIMES DAILY  . acetaminophen (TYLENOL) 325 MG tablet Take 2 tablets (650 mg total) by mouth every 4 (four) hours as needed for headache or mild pain.  Marland Kitchen allopurinol (ZYLOPRIM) 100 MG tablet TAKE 2 TABLETS BY MOUTH EVERY DAY  . apixaban (ELIQUIS) 5 MG TABS tablet Take 1 tablet (5 mg total) by mouth 2 (two) times daily.  Marland Kitchen atorvastatin (LIPITOR) 40 MG tablet Take 1 tablet (40 mg total) by mouth daily.  . benazepril (LOTENSIN) 10 MG tablet Take 1 tablet (10 mg total) by mouth daily.  . benazepril (LOTENSIN) 5 MG tablet TAKE 1 TABLET BY MOUTH DAILY WITH 10MG OF BENAZEPRIL TO EQUAL 15MG DAILY  . blood glucose meter kit and supplies KIT Dispense based on patient and insurance preference. Use up to four times daily as directed. (FOR ICD-9 250.00, 250.01).  . cyanocobalamin 1000 MCG tablet Take 2,000 mcg by mouth daily.  Marland Kitchen diltiazem (CARDIZEM CD) 240 MG 24 hr capsule Take 1 capsule (240  mg total) by mouth 2 (two) times daily.  . ferrous sulfate 325 (65 FE) MG tablet Take 1 tablet (325 mg total) by mouth 2 (two) times daily with a meal.  . fluticasone (FLONASE) 50 MCG/ACT nasal spray Place 2 sprays into both nostrils as needed for allergies or rhinitis.  . folic acid (FOLVITE) 1 MG tablet Take 1 tablet (1 mg total) by mouth daily.  . furosemide (LASIX) 40 MG tablet TAKE 1 TABLET BY MOUTH DAILY AS NEEDED FOR EDEMA (EDEMA AND WEIGHT GAIN).  Marland Kitchen gabapentin (NEURONTIN) 100 MG capsule Take 1 capsule (100 mg total) by mouth at bedtime.  Marland Kitchen glucose blood (ACCU-CHEK GUIDE) test strip USE AS DIRECTED UP TO 4 TIMES DAILY  . levothyroxine (SYNTHROID) 112 MCG tablet Take 1 tablet (112 mcg total) by mouth daily before breakfast.  . metFORMIN (GLUCOPHAGE) 500 MG tablet Take 1 tablet (500 mg  total) by mouth daily with breakfast.  . metoprolol succinate (TOPROL XL) 25 MG 24 hr tablet Take 1 tablet (25 mg total) by mouth daily.  . Multiple Vitamins-Minerals (MULTIVITAMIN GUMMIES MENS PO) Take 2 tablets by mouth daily with breakfast.   . nitroGLYCERIN (NITROSTAT) 0.4 MG SL tablet Place 1 tablet (0.4 mg total) under the tongue every 5 (five) minutes as needed for chest pain. Up to 3 doses  . OXYGEN Inhale 2 L into the lungs at bedtime.  . pantoprazole (PROTONIX) 40 MG tablet Take 1 tablet (40 mg total) by mouth every morning.  . sennosides-docusate sodium (SENOKOT-S) 8.6-50 MG tablet Take 1 tablet by mouth as needed for constipation.  . traMADol (ULTRAM) 50 MG tablet Take 1 tablet (50 mg total) by mouth 2 (two) times daily.  . traMADol (ULTRAM-ER) 200 MG 24 hr tablet Take 1 tablet (200 mg total) by mouth daily.     Allergies:   Patient has no known allergies.   Social History   Socioeconomic History  . Marital status: Married    Spouse name: [name]  . Number of children: Not on file  . Years of education: 59  . Highest education level: Not on file  Occupational History  . Occupation: retired     Fish farm manager: OTHER    Comment: Mining engineer  Tobacco Use  . Smoking status: Never Smoker  . Smokeless tobacco: Never Used  Substance and Sexual Activity  . Alcohol use: No    Alcohol/week: 0.0 standard drinks    Comment: 1 beer a month  . Drug use: No  . Sexual activity: Not Currently  Other Topics Concern  . Not on file  Social History Narrative   Pt lives with wife. Does have stairs, but patient doesn't use them. Pt has completed technical school   Social Determinants of Health   Financial Resource Strain:   . Difficulty of Paying Living Expenses:   Food Insecurity:   . Worried About Charity fundraiser in the Last Year:   . Arboriculturist in the Last Year:   Transportation Needs:   . Film/video editor (Medical):   Marland Kitchen Lack of Transportation (Non-Medical):   Physical Activity:   . Days of Exercise per Week:   . Minutes of Exercise per Session:   Stress:   . Feeling of Stress :   Social Connections:   . Frequency of Communication with Friends and Family:   . Frequency of Social Gatherings with Friends and Family:   . Attends Religious Services:   . Active Member of Clubs or Organizations:   . Attends Archivist Meetings:   Marland Kitchen Marital Status:      Family History: The patient's family history includes Lung cancer in his father; Stroke in his mother.  ROS:   Please see the history of present illness.     All other systems reviewed and are negative.  EKGs/Labs/Other Studies Reviewed:    The following studies were reviewed today: See above  EKG:  EKG is not ordered today.    Recent Labs: 12/17/2018: ALT 61 12/23/2018: B Natriuretic Peptide 361.3; Magnesium 2.4 04/14/2019: Hemoglobin 13.6; Platelets 158 05/25/2019: BUN 23; Creatinine, Ser 1.18; Potassium 4.0; Sodium 140; TSH 1.55  Recent Lipid Panel    Component Value Date/Time   CHOL 103 12/09/2018 0530   CHOL 138 12/05/2016 1100   TRIG 54 12/09/2018 0530   HDL 43 12/09/2018 0530   HDL 46  12/05/2016 1100   CHOLHDL  2.4 12/09/2018 0530   VLDL 11 12/09/2018 0530   LDLCALC 49 12/09/2018 0530   LDLCALC 67 12/05/2016 1100    Physical Exam:    VS:  BP 106/60   Pulse 76   Ht 6' 3"  (1.905 m)   Wt 246 lb 9.6 oz (111.9 kg)   SpO2 97%   BMI 30.82 kg/m     Wt Readings from Last 3 Encounters:  06/21/19 246 lb 9.6 oz (111.9 kg)  05/25/19 244 lb (110.7 kg)  05/19/19 246 lb (111.6 kg)     GEN:  Well nourished, well developed in no acute distress HEENT: Normal NECK: No JVD; No carotid bruits LYMPHATICS: No lymphadenopathy CARDIAC: irreg irreg, no murmurs, rubs, gallops RESPIRATORY:  Clear to auscultation without rales, wheezing or rhonchi  ABDOMEN: Soft, non-tender, non-distended MUSCULOSKELETAL:  No edema; No deformity  SKIN: Warm and dry NEUROLOGIC:  Alert and oriented x 3 PSYCHIATRIC:  Normal affect   ASSESSMENT:    1. Persistent atrial fibrillation ([place])   2. Coronary artery disease involving coronary bypass graft of native heart without angina pectoris   3. History of stroke with residual deficit    PLAN:    In order of problems listed above:  Atrial fibrillation persistent -Rate controlled in the 80s.  DC cardioversion was unsuccessful.  Continue with Eliquis. -I had lengthy discussion with him that his energy level may not be related to his atrial fibrillation.  Trying to encourage more daily exercise.  No signs of further anemia.  I also told him that a repeat cardioversion given that the original cardioversion surrounding the setting of his chest wall hematoma failed would be of little utility.  I expressed to him that many of our patients are in rate controlled atrial fibrillation.  Despite our discussion, he is convinced that if he were in normal rhythm his life would turn around and he would have more energy.  Because of this, we will have him talk again with Dr. Caryl Comes for options.  Anticoagulation -CHA2DS2-VASc 6.  Aspirin was stopped.  Hemoglobin has  remained stable.  Remember he had a chest wall hematoma.  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 Lamonte Sakai.  He is no longer on amiodarone.  Prior stroke 1996 -Mild left-sided hemiplegia.  Uses a walker.  Quite a slow gait.  Poor energy.  He is very frustrated.  I encouraged further exercise, activity.  Coronary disease -CABG.  Catheterization reviewed as above in 2019 only 1 graft was down.  The other 3 were patent.  Hyperlipidemia -LDL 49.  Continue with Lipitor.  High intensity dose 40 mg.     Medication Adjustments/Labs and Tests Ordered: Current medicines are reviewed at length with the patient today.  Concerns regarding medicines are outlined above.  No orders of the defined types were placed in this encounter.  No orders of the defined types were placed in this encounter.   Patient Instructions  Medication Instructions:  The current medical regimen is effective;  continue present plan and medications.  *If you need a refill on your cardiac medications before your next appointment, please call your pharmacy*  Please schedule a follow up appointment with [name] regarding options for treatment of At Fib.  Follow-Up: At [facility], you and your health needs are our priority.  As part of our continuing mission to provide you with exceptional heart care, we have created designated Provider Care Teams.  These Care  Teams include your primary Cardiologist (physician) and Advanced Practice Providers (APPs -  Physician Assistants and Nurse Practitioners) who all work together to provide you with the care you need, when you need it.  We recommend signing up for the patient portal called "MyChart".  Sign up information is provided on this After Visit Summary.  MyChart is used to connect with patients for Virtual Visits (Telemedicine).  Patients are able to view lab/test results, encounter notes, upcoming  appointments, etc.  Non-urgent messages can be sent to your provider as well.   To learn more about what you can do with MyChart, go to [email or web address].    Your next appointment:   12 month(s)  The format for your next appointment:   In person  Provider:   Candee Furbish, MD   Thank you for choosing [facility]!!         Signed, Candee Furbish, MD  06/21/2019 12:07 PM    [place]

## 2019-06-21 NOTE — Patient Instructions (Signed)
Medication Instructions:  The current medical regimen is effective;  continue present plan and medications.  *If you need a refill on your cardiac medications before your next appointment, please call your pharmacy*  Please schedule a follow up appointment with [name] regarding options for treatment of At Fib.  Follow-Up: At [facility], you and your health needs are our priority.  As part of our continuing mission to provide you with exceptional heart care, we have created designated Provider Care Teams.  These Care Teams include your primary Cardiologist (physician) and Advanced Practice Providers (APPs -  Physician Assistants and Nurse Practitioners) who all work together to provide you with the care you need, when you need it.  We recommend signing up for the patient portal called "MyChart".  Sign up information is provided on this After Visit Summary.  MyChart is used to connect with patients for Virtual Visits (Telemedicine).  Patients are able to view lab/test results, encounter notes, upcoming appointments, etc.  Non-urgent messages can be sent to your provider as well.   To learn more about what you can do with MyChart, go to [email or web address].    Your next appointment:   12 month(s)  The format for your next appointment:   In person  Provider:   Candee Furbish, MD   Thank you for choosing [facility]!!

## 2019-06-28 ENCOUNTER — Encounter: Payer: Self-pay | Admitting: Internal Medicine

## 2019-06-28 ENCOUNTER — Other Ambulatory Visit: Payer: Self-pay

## 2019-06-28 ENCOUNTER — Ambulatory Visit (INDEPENDENT_AMBULATORY_CARE_PROVIDER_SITE_OTHER): Payer: Medicare Other | Admitting: Internal Medicine

## 2019-06-28 VITALS — BP 102/72 | HR 74 | Ht 75.0 in | Wt 245.0 lb

## 2019-06-28 DIAGNOSIS — I2510 Atherosclerotic heart disease of native coronary artery without angina pectoris: Secondary | ICD-10-CM

## 2019-06-28 DIAGNOSIS — Z9861 Coronary angioplasty status: Secondary | ICD-10-CM

## 2019-06-28 DIAGNOSIS — I4819 Other persistent atrial fibrillation: Secondary | ICD-10-CM | POA: Diagnosis not present

## 2019-06-28 NOTE — Patient Instructions (Addendum)
Medication Instructions:  Your physician recommends that you continue on your current medications as directed. Please refer to the Current Medication list given to you today.  *If you need a refill on your cardiac medications before your next appointment, please call your pharmacy*   Lab Work: None ordered.  If you have labs (blood work) drawn today and your tests are completely normal, you will receive your results only by: Marland Kitchen MyChart Message (if you have MyChart) OR . A paper copy in the mail If you have any lab test that is abnormal or we need to change your treatment, we will call you to review the results.   Testing/Procedures:  You will be referred to AFIB clinic for possible Tikosyn admission   Follow-Up: At [facility], you and your health needs are our priority.  As part of our continuing mission to provide you with exceptional heart care, we have created designated Provider Care Teams.  These Care Teams include your primary Cardiologist (physician) and Advanced Practice Providers (APPs -  Physician Assistants and Nurse Practitioners) who all work together to provide you with the care you need, when you need it.  We recommend signing up for the patient portal called "MyChart".  Sign up information is provided on this After Visit Summary.  MyChart is used to connect with patients for Virtual Visits (Telemedicine).  Patients are able to view lab/test results, encounter notes, upcoming appointments, etc.  Non-urgent messages can be sent to your provider as well.   To learn more about what you can do with MyChart, go to [email or web address].    Your next appointment:   3 months with [name].  You will receive a reminder letter to schedule.

## 2019-06-28 NOTE — Progress Notes (Signed)
ELECTROPHYSIOLOGY OFFICE NOTE  Patient ID: [name], MRN: [id number], DOB/AGE: 1946/01/09 74 y.o. Admit date: (Not on file) Date of Consult: 06/28/2019  Primary Physician: Debbrah Alar, NP Primary Cardiologist: Etter Sjogren        HPI [name] is a 74 y.o. male with a history of coronary artery disease with prior bypass in 2004 and stenting of his circumflex 2011.  He has recurrent atrial fibrillation.  During hospitalization 9/20 he developed a spontaneous chest wall hematoma that prompted the discontinuation of his anticoagulation  He has been resumed on anticoagulation under the care of Dr. MS  He has been previously managed on amiodarone discontinued 9/18 because of concerns of amiodarone lung toxicity based on a low DLCO (55) CT scan at that point demonstrated no interstitial lung disease.  Amiodarone was discontinued.  A decision was made as to "permanent atrial fibrillation " but his "#1 goal is to be back in normal rhythm."  He feels terrible.  Short of breath.  Weak.  Wakes up in the morning fatigue.  Uses oxygen at night.  He has documented sleep apnea.  Tested about 10 years ago.  Unable to tolerate CPAP at that time.  Some edema.  No chest pain.  DATE TEST EF   12/19  Echo   55-60 %   12//19 LHC   % LIMAp; SVG-D1& OM p; SVG -Cx T        Date Cr K Hgb  11/20 1.0 4.4 12.9         Thromboembolic risk factors ( age  -2, HTN-1, TIA/CVA-2,  Vasc disease -1) for a CHADSVASc Score of >=6     Past Medical History:  Diagnosis Date  . Acute encephalopathy 12/12/2015  . Acute respiratory failure with hypoxia and hypercapnia ([place]) 12/12/2015  . Allergic rhinitis 02/13/2010   Qualifier: Diagnosis of  By: Wynona Luna   . Angina at rest [facility]) 10/22/2015  . Ataxia 08/10/2013  . Atrial fibrillation ([place]) 11/2018  . B12 deficiency 08/11/2013  . BACK PAIN, LUMBAR 01/29/2009   Qualifier: Diagnosis of  By: Wynona Luna   . BENIGN POSITIONAL VERTIGO  11/23/2009   Qualifier: Diagnosis of  By: Wynona Luna   . Chronic diastolic heart failure ([place]) 04/24/2015  . Chronic pain    left sided-[place]  . Coronary 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 ([place]) 11/04/2015   s/p CABG 2004; CFX DES 2011  . Essential hypertension 12/12/2006   Qualifier: Diagnosis of  By: [name] RMA, Lucy    . GERD 12/12/2006   Qualifier: Diagnosis of  By: [name] RMA, Lucy    . GOUT 12/12/2006   Qualifier: Diagnosis of  By: Reatha Armour, Lucy    . Hyperlipidemia LDL goal <70 12/12/2006   Qualifier: Diagnosis of  By: [name] RMA, Lucy     . Hypogonadism male 04/10/2011  . Hypothyroidism, acquired 02/15/2013  . Insomnia 05/21/2013  . Long term current use of anticoagulant therapy 06/24/2011  . Obstructive sleep apnea-Failed CPAP 06/17/2007   Failed CPAP Using O 2 for sleep   . Paroxysmal atrial fibrillation ([place]) 12/06/2016  . 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 Thoracic aorta atherosclerosis ([place]) 04/27/2017  . Tubular adenoma of colon 05/2010  . Type 2 diabetes mellitus without complication, without long-term current use of insulin ([place]) 10/05/2007   Qualifier: Diagnosis of  By: Wynona Luna   .  Vasovagal syncope       Surgical History:  Past Surgical History:  Procedure Laterality Date  . CARDIAC CATHETERIZATION N/A 10/25/2015   Procedure: Left Heart Cath and Cors/Grafts Angiography;  Surgeon: Troy Sine, MD;  Location: [place] [facility];  Service: Cardiovascular;  Laterality: N/A;  . CARDIOVERSION  06/20/2011   Procedure: CARDIOVERSION;  Surgeon: Larey Dresser, MD;  Location: [facility] ENDOSCOPY;  Service: Cardiovascular;  Laterality: N/A;  . CARDIOVERSION N/A 04/26/2015   Procedure: CARDIOVERSION;  Surgeon: Dorothy Spark, MD;  Location: [facility] ENDOSCOPY;  Service: Cardiovascular;  Laterality: N/A;  . CARDIOVERSION N/A 05/10/2015   Procedure: CARDIOVERSION;  Surgeon: Larey Dresser, MD;  Location:  [place];  Service: Cardiovascular;  Laterality: N/A;  . CARDIOVERSION N/A 12/10/2018   Procedure: CARDIOVERSION;  Surgeon: Sanda Tarek Cravens, MD;  Location: [place];  Service: Cardiovascular;  Laterality: N/A;  . CATARACT EXTRACTION  05/2010   left eye  . CATARACT EXTRACTION  04/2010   right eye  . CORONARY ARTERY BYPASS GRAFT     stent  . ESOPHAGOGASTRODUODENOSCOPY  03-25-2005  . LEFT HEART CATH AND CORS/GRAFTS ANGIOGRAPHY N/A 02/19/2018   Procedure: LEFT HEART CATH AND CORS/GRAFTS ANGIOGRAPHY;  Surgeon: Martinique, Peter M, MD;  Location: [place] [facility];  Service: Cardiovascular;  Laterality: N/A;  . NASAL SEPTUM SURGERY    . PERCUTANEOUS PLACEMENT INTRAVASCULAR STENT CERVICAL CAROTID ARTERY     03-2009; using a drug-eluting platform of the circumflex cornoray artery with a 3.0 x 18 [employer] Promus drug-eluting platform post dilated to 3.75 with a noncompliant balloon.  . TEE WITHOUT CARDIOVERSION  06/20/2011   Procedure: TRANSESOPHAGEAL ECHOCARDIOGRAM (TEE);  Surgeon: Larey Dresser, MD;  Location: [facility] ENDOSCOPY;  Service: Cardiovascular;  Laterality: N/A;     Home Meds: Current Meds  Medication Sig  . Accu-Chek FastClix Lancets MISC USE TO TEST 3 TIMES DAILY  . acetaminophen (TYLENOL) 325 MG tablet Take 2 tablets (650 mg total) by mouth every 4 (four) hours as needed for headache or mild pain.  Marland Kitchen allopurinol (ZYLOPRIM) 100 MG tablet TAKE 2 TABLETS BY MOUTH EVERY DAY  . apixaban (ELIQUIS) 5 MG TABS tablet Take 1 tablet (5 mg total) by mouth 2 (two) times daily.  Marland Kitchen atorvastatin (LIPITOR) 40 MG tablet Take 1 tablet (40 mg total) by mouth daily.  . benazepril (LOTENSIN) 10 MG tablet Take 1 tablet (10 mg total) by mouth daily.  . benazepril (LOTENSIN) 5 MG tablet TAKE 1 TABLET BY MOUTH DAILY WITH 10MG OF BENAZEPRIL TO EQUAL 15MG DAILY  . blood glucose meter kit and supplies KIT Dispense based on patient and insurance preference. Use up to four times daily as directed. (FOR ICD-9  250.00, 250.01).  . cyanocobalamin 1000 MCG tablet Take 2,000 mcg by mouth daily.  Marland Kitchen diltiazem (CARDIZEM CD) 240 MG 24 hr capsule Take 1 capsule (240 mg total) by mouth 2 (two) times daily.  . ferrous sulfate 325 (65 FE) MG tablet Take 1 tablet (325 mg total) by mouth 2 (two) times daily with a meal.  . fluticasone (FLONASE) 50 MCG/ACT nasal spray Place 2 sprays into both nostrils as needed for allergies or rhinitis.  . folic acid (FOLVITE) 1 MG tablet Take 1 tablet (1 mg total) by mouth daily.  . furosemide (LASIX) 40 MG tablet TAKE 1 TABLET BY MOUTH DAILY AS NEEDED FOR EDEMA (EDEMA AND WEIGHT GAIN).  Marland Kitchen gabapentin (NEURONTIN) 100 MG capsule Take 1 capsule (100 mg total) by mouth at bedtime.  Marland Kitchen  glucose blood (ACCU-CHEK GUIDE) test strip USE AS DIRECTED UP TO 4 TIMES DAILY  . levothyroxine (SYNTHROID) 112 MCG tablet Take 1 tablet (112 mcg total) by mouth daily before breakfast.  . metFORMIN (GLUCOPHAGE) 500 MG tablet Take 1 tablet (500 mg total) by mouth daily with breakfast.  . metoprolol succinate (TOPROL XL) 25 MG 24 hr tablet Take 1 tablet (25 mg total) by mouth daily.  . Multiple Vitamins-Minerals (MULTIVITAMIN GUMMIES MENS PO) Take 2 tablets by mouth daily with breakfast.   . nitroGLYCERIN (NITROSTAT) 0.4 MG SL tablet Place 1 tablet (0.4 mg total) under the tongue every 5 (five) minutes as needed for chest pain. Up to 3 doses  . OXYGEN Inhale 2 L into the lungs at bedtime.  . pantoprazole (PROTONIX) 40 MG tablet Take 1 tablet (40 mg total) by mouth every morning.  . sennosides-docusate sodium (SENOKOT-S) 8.6-50 MG tablet Take 1 tablet by mouth as needed for constipation.  . traMADol (ULTRAM) 50 MG tablet Take 1 tablet (50 mg total) by mouth 2 (two) times daily.  . traMADol (ULTRAM-ER) 200 MG 24 hr tablet Take 1 tablet (200 mg total) by mouth daily.    Allergies: No Known Allergies       ROS:  Please see the history of present illness.     All other systems reviewed and negative.    Physical Exam:  Well developed and nourished in no acute distress HENT normal Neck supple   Clear Regular rate and rhythm, no murmurs or gallops Abd-soft with active BS No Clubbing cyanosis edema swelling and tenderness of his right wrist by the thumb Skin-warm and dry A & Oriented  Grossly normal sensory and motor function  ECG atrial fibrillation at 74 Intervals-/10/39 Axis left -40   Assessment and Plan:  Atrial fibrillation-persistent-longstanding  ?  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  He thinks that this is the major cause of his symptoms.  He would like another effort to restore sinus rhythm.  We discussed alternative antiarrhythmic drugs including dofetilide and amiodarone.  It is not clear that he had amiodarone lung toxicity; however, we have elected to try dofetilide.  We reviewed the potential benefits as well as potential proarrhythmic risks.  He has untreated sleep apnea.  He uses oxygen at night.  He has agreed that if we are able to get him back in regular rhythm and he does not feel better he will undergo repeat sleep study and renewed efforts to find a CPAP system that works.  Euvolemic continue current meds  Discussed with Dr MS   Tolerating anticoagulation without bleeding   Will refer to Afib clinic for dofetilide initiation   More than 50% of 5 0 min was spent in counseling related to the above    Virl Axe

## 2019-06-29 ENCOUNTER — Encounter (HOSPITAL_COMMUNITY): Payer: Self-pay

## 2019-06-30 ENCOUNTER — Telehealth: Payer: Self-pay | Admitting: Pharmacist

## 2019-06-30 ENCOUNTER — Other Ambulatory Visit: Payer: Self-pay

## 2019-06-30 DIAGNOSIS — I4819 Other persistent atrial fibrillation: Secondary | ICD-10-CM

## 2019-06-30 NOTE — Progress Notes (Signed)
re

## 2019-06-30 NOTE — Telephone Encounter (Signed)
Medication list reviewed in anticipation of upcoming Tikosyn initiation. Patient is not taking any contraindicated or QTc prolonging medications.   Patient is anticoagulated on Eliquis on the appropriate dose. Please ensure that patient has not missed any anticoagulation doses in the 3 weeks prior to Tikosyn initiation.   Patient will need to be counseled to avoid use of Benadryl while on Tikosyn and in the 2-3 days prior to Tikosyn initiation.  Patient should have a K of at least 4 and Mg of at least 2 before initiation of Tikosyn. These should be followed closely, especially since patient is on furosemide.

## 2019-07-11 ENCOUNTER — Telehealth: Payer: Self-pay | Admitting: Internal Medicine

## 2019-07-11 NOTE — Telephone Encounter (Signed)
New Message     Pt is calling and says he has been waiting for a call to be set up for a procedure with [name]     Please call back

## 2019-07-12 NOTE — Telephone Encounter (Signed)
Spoke with pt.  Pt states he is waiting to hear something regarding hospital admission for starting Tikosyn.  Pt advised Rushie Goltz, RN with AFIB clinic sent him information through [facility].  Pt states he does not know how to use MyChart.  Provided pt with Afib clinic phone number [phone] and advised to contact re: instructions for Tikosyn admission.  Pt verbalizes understanding and agrees with current plan.

## 2019-07-13 ENCOUNTER — Encounter (HOSPITAL_COMMUNITY): Payer: Self-pay | Admitting: *Deleted

## 2019-07-13 ENCOUNTER — Other Ambulatory Visit (HOSPITAL_COMMUNITY): Payer: Self-pay | Admitting: *Deleted

## 2019-07-29 ENCOUNTER — Other Ambulatory Visit (HOSPITAL_COMMUNITY)
Admission: RE | Admit: 2019-07-29 | Discharge: 2019-07-29 | Disposition: A | Discharge status: Home / Self Care | Payer: Medicare Other | Source: Ambulatory Visit | Attending: Physician Assistant | Admitting: Physician Assistant

## 2019-07-29 DIAGNOSIS — Z01812 Encounter for preprocedural laboratory examination: Secondary | ICD-10-CM | POA: Diagnosis not present

## 2019-07-29 DIAGNOSIS — Z20822 Contact with and (suspected) exposure to covid-19: Secondary | ICD-10-CM | POA: Diagnosis not present

## 2019-07-29 LAB — SARS CORONAVIRUS 2 (TAT 6-24 HRS): SARS Coronavirus 2: NEGATIVE

## 2019-08-01 NOTE — Progress Notes (Signed)
Primary Care Physician: Debbrah Alar, NP Primary Cardiologist: [name] Primary Electrophysiologist: [name] [name] is a 74 y.o. 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 Patient is on Eliquis for a CHADS2VASC score of 6. He was seen by [name] on 06/28/19 and dofetilide was recommended. He continues in rate controlled afib today with symptoms of extreme fatigue. Patient denies any missed doses of anticoagulation in the last 3 weeks.   Today, he denies symptoms of palpitations, chest pain, shortness of breath, orthopnea, PND, lower extremity edema, dizziness, presyncope, syncope, snoring, daytime somnolence, bleeding, or neurologic sequela. The patient is tolerating medications without difficulties and is otherwise without complaint today.    Atrial Fibrillation Risk Factors:  he does have symptoms or diagnosis of sleep apnea. he is not compliant with CPAP therapy.   he has a BMI of Body mass index is 30.82 kg/m.Marland Kitchen Filed Weights   08/02/19 1127  Weight: 111.9 kg    Family History  Problem Relation Age of Onset  . Lung cancer Father        deceased  . Stroke Mother        deceased-MINISTROKES     Atrial Fibrillation Management history:  Previous antiarrhythmic drugs: amiodarone Previous cardioversions: several, most recently 12/10/18 Previous ablations: none CHADS2VASC score: 6 Anticoagulation history: Eliquis   Past Medical History:  Diagnosis Date  . Acute encephalopathy 12/12/2015  . Acute respiratory failure with hypoxia and hypercapnia ([place]) 12/12/2015  . Allergic rhinitis 02/13/2010   Qualifier: Diagnosis of  By: Wynona Luna   . Angina at rest [facility]) 10/22/2015  . Ataxia 08/10/2013  . Atrial fibrillation  ([place]) 11/2018  . B12 deficiency 08/11/2013  . BACK PAIN, LUMBAR 01/29/2009   Qualifier: Diagnosis of  By: Wynona Luna   . BENIGN POSITIONAL VERTIGO 11/23/2009   Qualifier: Diagnosis of  By: Wynona Luna   . Chronic diastolic heart failure ([place]) 04/24/2015  . Chronic pain    left sided-[place]  . Coronary 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 ([place]) 11/04/2015   s/p CABG 2004; CFX DES 2011  . Essential hypertension 12/12/2006   Qualifier: Diagnosis of  By: [name] RMA, Lucy    . GERD 12/12/2006   Qualifier: Diagnosis of  By: [name] RMA, Lucy    . GOUT 12/12/2006   Qualifier: Diagnosis of  By: Reatha Armour, Lucy    . Hyperlipidemia LDL goal <70 12/12/2006   Qualifier: Diagnosis of  By: [name] RMA, Lucy     . Hypogonadism male 04/10/2011  . Hypothyroidism, acquired 02/15/2013  . Insomnia 05/21/2013  . Long term current use of anticoagulant therapy 06/24/2011  . Obstructive sleep apnea-Failed CPAP 06/17/2007   Failed CPAP Using O 2 for sleep   . Paroxysmal atrial fibrillation ([place]) 12/06/2016  . Stroke (cerebrum) ([place]) 01/13/2017   also 1996; resultant hemiplegia of dominant side  . Thoracic aorta atherosclerosis ([place]) 04/27/2017  . Tubular adenoma of colon 05/2010  . Type 2 diabetes mellitus without complication, without long-term current use of insulin ([place]) 10/05/2007   Qualifier: Diagnosis of  By: Wynona Luna   . Vasovagal syncope    Past Surgical History:  Procedure Laterality Date  . CARDIAC CATHETERIZATION N/A 10/25/2015  Procedure: Left Heart Cath and Cors/Grafts Angiography;  Surgeon: Troy Sine, MD;  Location: [facility];  Service: Cardiovascular;  Laterality: N/A;  . CARDIOVERSION  06/20/2011   Procedure: CARDIOVERSION;  Surgeon: Larey Dresser, MD;  Location: [facility] ENDOSCOPY;  Service: Cardiovascular;  Laterality: N/A;  . CARDIOVERSION N/A 04/26/2015   Procedure: CARDIOVERSION;  Surgeon: Dorothy Spark, MD;  Location: [facility] ENDOSCOPY;  Service:  Cardiovascular;  Laterality: N/A;  . CARDIOVERSION N/A 05/10/2015   Procedure: CARDIOVERSION;  Surgeon: Larey Dresser, MD;  Location: [place] [facility];  Service: Cardiovascular;  Laterality: N/A;  . CARDIOVERSION N/A 12/10/2018   Procedure: CARDIOVERSION;  Surgeon: Sanda Klein, MD;  Location: [place];  Service: Cardiovascular;  Laterality: N/A;  . CATARACT EXTRACTION  05/2010   left eye  . CATARACT EXTRACTION  04/2010   right eye  . CORONARY ARTERY BYPASS GRAFT     stent  . ESOPHAGOGASTRODUODENOSCOPY  03-25-2005  . LEFT HEART CATH AND CORS/GRAFTS ANGIOGRAPHY N/A 02/19/2018   Procedure: LEFT HEART CATH AND CORS/GRAFTS ANGIOGRAPHY;  Surgeon: Martinique, Peter M, MD;  Location: [facility];  Service: Cardiovascular;  Laterality: N/A;  . NASAL SEPTUM SURGERY    . PERCUTANEOUS PLACEMENT INTRAVASCULAR STENT CERVICAL CAROTID ARTERY     03-2009; using a drug-eluting platform of the circumflex cornoray artery with a 3.0 x 18 [employer] Promus drug-eluting platform post dilated to 3.75 with a noncompliant balloon.  . TEE WITHOUT CARDIOVERSION  06/20/2011   Procedure: TRANSESOPHAGEAL ECHOCARDIOGRAM (TEE);  Surgeon: Larey Dresser, MD;  Location: [facility] ENDOSCOPY;  Service: Cardiovascular;  Laterality: N/A;    Current Outpatient Medications  Medication Sig Dispense Refill  . Accu-Chek FastClix Lancets MISC USE TO TEST 3 TIMES DAILY 102 each 0  . acetaminophen (TYLENOL) 325 MG tablet Take 2 tablets (650 mg total) by mouth every 4 (four) hours as needed for headache or mild pain.    Marland Kitchen allopurinol (ZYLOPRIM) 100 MG tablet TAKE 2 TABLETS BY MOUTH EVERY DAY 180 tablet 1  . apixaban (ELIQUIS) 5 MG TABS tablet Take 1 tablet (5 mg total) by mouth 2 (two) times daily. 60 tablet 11  . atorvastatin (LIPITOR) 40 MG tablet Take 1 tablet (40 mg total) by mouth daily. 90 tablet 3  . benazepril (LOTENSIN) 10 MG tablet Take 1 tablet (10 mg total) by mouth daily. 90 tablet 1  . benazepril (LOTENSIN) 5 MG tablet  TAKE 1 TABLET BY MOUTH DAILY WITH 10MG OF BENAZEPRIL TO EQUAL 15MG DAILY 90 tablet 1  . blood glucose meter kit and supplies KIT Dispense based on patient and insurance preference. Use up to four times daily as directed. (FOR ICD-9 250.00, 250.01). 102 each 0  . cyanocobalamin 1000 MCG tablet Take 2,000 mcg by mouth daily.    Marland Kitchen diltiazem (CARDIZEM CD) 240 MG 24 hr capsule Take 1 capsule (240 mg total) by mouth 2 (two) times daily. 180 capsule 2  . ferrous sulfate 325 (65 FE) MG tablet Take 1 tablet (325 mg total) by mouth 2 (two) times daily with a meal. 180 tablet 1  . fluticasone (FLONASE) 50 MCG/ACT nasal spray Place 2 sprays into both nostrils as needed for allergies or rhinitis. 16 g 0  . folic acid (FOLVITE) 1 MG tablet Take 1 tablet (1 mg total) by mouth daily. 90 tablet 1  . furosemide (LASIX) 40 MG tablet TAKE 1 TABLET BY MOUTH DAILY AS NEEDED FOR EDEMA (EDEMA AND WEIGHT GAIN). 90 tablet 1  . gabapentin (NEURONTIN)  100 MG capsule Take 1 capsule (100 mg total) by mouth at bedtime. 90 capsule 1  . glucose blood (ACCU-CHEK GUIDE) test strip USE AS DIRECTED UP TO 4 TIMES DAILY 100 strip 0  . levothyroxine (SYNTHROID) 112 MCG tablet Take 1 tablet (112 mcg total) by mouth daily before breakfast. 90 tablet 1  . metFORMIN (GLUCOPHAGE) 500 MG tablet Take 1 tablet (500 mg total) by mouth daily with breakfast. 90 tablet 1  . metoprolol succinate (TOPROL XL) 25 MG 24 hr tablet Take 1 tablet (25 mg total) by mouth daily. 90 tablet 3  . Multiple Vitamins-Minerals (MULTIVITAMIN GUMMIES MENS PO) Take 2 tablets by mouth daily with breakfast.     . nitroGLYCERIN (NITROSTAT) 0.4 MG SL tablet Place 1 tablet (0.4 mg total) under the tongue every 5 (five) minutes as needed for chest pain. Up to 3 doses 10 tablet 2  . OXYGEN Inhale 2 L into the lungs at bedtime.    . pantoprazole (PROTONIX) 40 MG tablet Take 1 tablet (40 mg total) by mouth every morning. 90 tablet 1  . sennosides-docusate sodium (SENOKOT-S)  8.6-50 MG tablet Take 1 tablet by mouth as needed for constipation.    . traMADol (ULTRAM) 50 MG tablet Take 1 tablet (50 mg total) by mouth 2 (two) times daily. 180 tablet 1  . traMADol (ULTRAM-ER) 200 MG 24 hr tablet Take 1 tablet (200 mg total) by mouth daily. 90 tablet 1   No current facility-administered medications for this encounter.    No Known Allergies  Social History   Socioeconomic History  . Marital status: Married    Spouse name: [name]  . Number of children: Not on file  . Years of education: 34  . Highest education level: Not on file  Occupational History  . 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 Smoking status: Never Smoker  . Smokeless tobacco: Never Used  Substance and Sexual Activity  . Alcohol use: No    Alcohol/week: 0.0 standard drinks    Comment: 1 beer a month  . Drug use: No  . Sexual activity: Not Currently  Other Topics Concern  . Not on file  Social History Narrative   Pt lives with wife. Does have stairs, but patient doesn't use them. Pt has completed technical school   Social Determinants of Health   Financial Resource Strain:   . Difficulty of Paying Living Expenses:   Food Insecurity:   . Worried About Charity fundraiser in the Last Year:   . Arboriculturist in the Last Year:   Transportation Needs:   . Film/video editor (Medical):   Marland Kitchen Lack of Transportation (Non-Medical):   Physical Activity:   . Days of Exercise per Week:   . Minutes of Exercise per Session:   Stress:   . Feeling of Stress :   Social Connections:   . Frequency of Communication with Friends and Family:   . Frequency of Social Gatherings with Friends and Family:   . Attends Religious Services:   . Active Member of Clubs or Organizations:   . Attends Archivist Meetings:   Marland Kitchen Marital Status:   Intimate Partner Violence:   . Fear of Current or Ex-Partner:   . Emotionally Abused:   Marland Kitchen Physically Abused:    . Sexually Abused:      ROS- All systems are reviewed and negative except as per the HPI above.  Physical Exam: Vitals:   08/02/19 1127  BP: 114/68  Pulse: 67  Weight: 111.9 kg  Height: 6' 3"  (1.905 m)    GEN- The patient is well appearing obese elderly male, alert and oriented x 3 today.   Head- normocephalic, atraumatic Eyes-  Sclera clear, conjunctiva pink Ears- hearing intact Oropharynx- clear Neck- supple  Lungs- Clear to ausculation bilaterally, normal work of breathing Heart- irregular rate and rhythm, no murmurs, rubs or gallops  GI- soft, NT, ND, + BS Extremities- no clubbing, cyanosis, or edema MS- no significant deformity or atrophy Skin- no rash or lesion Psych- euthymic mood, full affect Neuro- strength and sensation are intact  Wt Readings from Last 3 Encounters:  08/02/19 111.9 kg  06/28/19 111.1 kg  06/21/19 111.9 kg    EKG today demonstrates afib HR 67, QRS 98, QTc 207 (380 ms manually calculated)  Echo 02/18/18 demonstrated  - Left ventricle: The cavity size was normal. There was mild focal  basal hypertrophy of the septum. Systolic function was normal.  The estimated ejection fraction was in the range of 55% to 60%.  Wall motion was normal; there were no regional wall motion  abnormalities. Doppler parameters are consistent with abnormal  left ventricular relaxation (grade 1 diastolic dysfunction).  Doppler parameters are consistent with elevated ventricular  end-diastolic filling pressure.  - Mitral valve: Calcified annulus. Mildly thickened leaflets .  There was mild regurgitation.  - Left atrium: The atrium was mildly dilated.  - Right ventricle: The cavity size was mildly dilated. Wall  thickness was normal. Systolic function was mildly reduced.  - Right atrium: The atrium was normal in size.  - Tricuspid valve: There was mild regurgitation.  - Pulmonary arteries: Systolic pressure was within the normal  range.  -  Inferior vena cava: The vessel was normal in size.  - Pericardium, extracardiac: There was no pericardial effusion.   Epic records are reviewed at length today  CHA2DS2-VASc Score = 6  The patient's score is based upon: CHF History: 0 HTN History: 1 Age : 2 Diabetes History: 0 Stroke History: 2 Vascular Disease History: 1 Gender: 0      ASSESSMENT AND PLAN: 1. Longstanding Persistent Atrial Fibrillation (ICD10:  I48.11) The patient's CHA2DS2-VASc score is 6, indicating a 9.7% annual risk of stroke.   Patient wants to pursue dofetilide, aware of risk vrs benefit Aware of price of dofetilide  Patient will continue on Eliquis 5 mg BID, states no missed doses in the last 3 weeks. No benadryl use PharmD has screened drugs and no QT prolonging drugs on board QTc in SR 429 ms, Labs today show creatinine at 1.29, K+ 4.8 and mag 2.1, CrCl calculated at 77 mL/min Continue diltiazem 240 mg BID Continue Toprol 25 mg daily  2. Secondary Hypercoagulable State (ICD10:  D68.69) The patient is at significant risk for stroke/thromboembolism based upon his CHA2DS2-VASc Score of 6.  Continue Apixaban (Eliquis).   3. Obesity Body mass index is 30.82 kg/m. Lifestyle modification was discussed at length including regular exercise and weight reduction.  4. Obstructive sleep apnea The importance of adequate treatment of sleep apnea was discussed today in order to improve our ability to maintain sinus rhythm long term. Patient diagnosed remotely and did not tolerate CPAP therapy.  5. CAD S/p CABG No anginal symptoms.   To be admitted later today once a bed becomes available.    [facility] [address] [place], [place] [address] [phone]  08/02/2019 12:39 PM

## 2019-08-02 ENCOUNTER — Inpatient Hospital Stay (HOSPITAL_COMMUNITY)
Admission: RE | Admit: 2019-08-02 | Discharge: 2019-08-05 | DRG: 309 | Disposition: A | Discharge status: Home / Self Care | Payer: Medicare Other | Source: Ambulatory Visit | Attending: Internal Medicine | Admitting: Internal Medicine

## 2019-08-02 ENCOUNTER — Other Ambulatory Visit: Payer: Self-pay

## 2019-08-02 ENCOUNTER — Ambulatory Visit (HOSPITAL_COMMUNITY)
Admission: RE | Admit: 2019-08-02 | Discharge: 2019-08-02 | Disposition: A | Discharge status: Home / Self Care | Payer: Medicare Other | Source: Ambulatory Visit | Attending: Physician Assistant | Admitting: Physician Assistant

## 2019-08-02 VITALS — BP 114/68 | HR 67 | Ht 75.0 in | Wt 246.6 lb

## 2019-08-02 DIAGNOSIS — E785 Hyperlipidemia, unspecified: Secondary | ICD-10-CM | POA: Diagnosis present

## 2019-08-02 DIAGNOSIS — Z951 Presence of aortocoronary bypass graft: Secondary | ICD-10-CM | POA: Diagnosis not present

## 2019-08-02 DIAGNOSIS — Z6830 Body mass index (BMI) 30.0-30.9, adult: Secondary | ICD-10-CM

## 2019-08-02 DIAGNOSIS — R001 Bradycardia, unspecified: Secondary | ICD-10-CM | POA: Diagnosis not present

## 2019-08-02 DIAGNOSIS — Z23 Encounter for immunization: Secondary | ICD-10-CM | POA: Diagnosis not present

## 2019-08-02 DIAGNOSIS — I4811 Longstanding persistent atrial fibrillation: Secondary | ICD-10-CM | POA: Diagnosis present

## 2019-08-02 DIAGNOSIS — Z823 Family history of stroke: Secondary | ICD-10-CM

## 2019-08-02 DIAGNOSIS — I110 Hypertensive heart disease with heart failure: Secondary | ICD-10-CM | POA: Diagnosis present

## 2019-08-02 DIAGNOSIS — I5032 Chronic diastolic (congestive) heart failure: Secondary | ICD-10-CM | POA: Diagnosis present

## 2019-08-02 DIAGNOSIS — E669 Obesity, unspecified: Secondary | ICD-10-CM | POA: Diagnosis present

## 2019-08-02 DIAGNOSIS — E119 Type 2 diabetes mellitus without complications: Secondary | ICD-10-CM | POA: Diagnosis present

## 2019-08-02 DIAGNOSIS — I429 Cardiomyopathy, unspecified: Secondary | ICD-10-CM | POA: Diagnosis not present

## 2019-08-02 DIAGNOSIS — D6869 Other thrombophilia: Secondary | ICD-10-CM | POA: Insufficient documentation

## 2019-08-02 DIAGNOSIS — I2510 Atherosclerotic heart disease of native coronary artery without angina pectoris: Secondary | ICD-10-CM | POA: Diagnosis present

## 2019-08-02 DIAGNOSIS — Z79890 Hormone replacement therapy (postmenopausal): Secondary | ICD-10-CM | POA: Diagnosis not present

## 2019-08-02 DIAGNOSIS — Z955 Presence of coronary angioplasty implant and graft: Secondary | ICD-10-CM | POA: Diagnosis not present

## 2019-08-02 DIAGNOSIS — Z8673 Personal history of transient ischemic attack (TIA), and cerebral infarction without residual deficits: Secondary | ICD-10-CM

## 2019-08-02 DIAGNOSIS — Z7901 Long term (current) use of anticoagulants: Secondary | ICD-10-CM

## 2019-08-02 DIAGNOSIS — I480 Paroxysmal atrial fibrillation: Secondary | ICD-10-CM | POA: Diagnosis present

## 2019-08-02 DIAGNOSIS — Z801 Family history of malignant neoplasm of trachea, bronchus and lung: Secondary | ICD-10-CM | POA: Diagnosis not present

## 2019-08-02 DIAGNOSIS — G4733 Obstructive sleep apnea (adult) (pediatric): Secondary | ICD-10-CM | POA: Diagnosis present

## 2019-08-02 DIAGNOSIS — I700 Atherosclerosis of aorta: Secondary | ICD-10-CM | POA: Diagnosis present

## 2019-08-02 DIAGNOSIS — M109 Gout, unspecified: Secondary | ICD-10-CM | POA: Diagnosis present

## 2019-08-02 DIAGNOSIS — K219 Gastro-esophageal reflux disease without esophagitis: Secondary | ICD-10-CM | POA: Diagnosis present

## 2019-08-02 DIAGNOSIS — E039 Hypothyroidism, unspecified: Secondary | ICD-10-CM | POA: Diagnosis present

## 2019-08-02 DIAGNOSIS — Z79899 Other long term (current) drug therapy: Secondary | ICD-10-CM | POA: Diagnosis not present

## 2019-08-02 DIAGNOSIS — I493 Ventricular premature depolarization: Secondary | ICD-10-CM | POA: Diagnosis not present

## 2019-08-02 LAB — BASIC METABOLIC PANEL
Anion gap: 11 (ref 5–15)
BUN: 23 mg/dL (ref 8–23)
CO2: 28 mmol/L (ref 22–32)
Calcium: 9.4 mg/dL (ref 8.9–10.3)
Chloride: 101 mmol/L (ref 98–111)
Creatinine, Ser: 1.29 mg/dL — ABNORMAL HIGH (ref 0.61–1.24)
GFR calc Af Amer: >60 mL/min (ref >60)
GFR calc non Af Amer: 53 mL/min — ABNORMAL LOW (ref >60)
Glucose, Bld: 119 mg/dL — ABNORMAL HIGH (ref 70–99)
Potassium: 4.8 mmol/L (ref 3.5–5.1)
Sodium: 140 mmol/L (ref 135–145)

## 2019-08-02 LAB — MAGNESIUM: Magnesium: 2.1 mg/dL (ref 1.7–2.4)

## 2019-08-02 MED ORDER — APIXABAN 5 MG PO TABS
5.0000 mg | ORAL_TABLET | Freq: Two times a day (BID) | ORAL | Status: DC
  Administered 2019-08-02 – 2019-08-05 (×6): 5 mg via ORAL
  Filled 2019-08-02 (×6): qty 1

## 2019-08-02 MED ORDER — DILTIAZEM HCL ER COATED BEADS 240 MG PO CP24
240.0000 mg | ORAL_CAPSULE | Freq: Two times a day (BID) | ORAL | Status: DC
  Administered 2019-08-02: 240 mg via ORAL
  Filled 2019-08-02 (×2): qty 1

## 2019-08-02 MED ORDER — FOLIC ACID 1 MG PO TABS
1.0000 mg | ORAL_TABLET | Freq: Every day | ORAL | Status: DC
  Administered 2019-08-03 – 2019-08-05 (×3): 1 mg via ORAL
  Filled 2019-08-02 (×3): qty 1

## 2019-08-02 MED ORDER — ALLOPURINOL 100 MG PO TABS
200.0000 mg | ORAL_TABLET | Freq: Every day | ORAL | Status: DC
  Administered 2019-08-03 – 2019-08-05 (×3): 200 mg via ORAL
  Filled 2019-08-02 (×3): qty 2

## 2019-08-02 MED ORDER — LEVOTHYROXINE SODIUM 112 MCG PO TABS
112.0000 ug | ORAL_TABLET | Freq: Every day | ORAL | Status: DC
  Administered 2019-08-03 – 2019-08-05 (×3): 112 ug via ORAL
  Filled 2019-08-02 (×3): qty 1

## 2019-08-02 MED ORDER — BENAZEPRIL HCL 5 MG PO TABS
15.0000 mg | ORAL_TABLET | Freq: Every day | ORAL | Status: DC
  Administered 2019-08-03 – 2019-08-05 (×3): 15 mg via ORAL
  Filled 2019-08-02 (×3): qty 3

## 2019-08-02 MED ORDER — ATORVASTATIN CALCIUM 40 MG PO TABS
40.0000 mg | ORAL_TABLET | Freq: Every day | ORAL | Status: DC
  Administered 2019-08-02 – 2019-08-04 (×3): 40 mg via ORAL
  Filled 2019-08-02 (×3): qty 1

## 2019-08-02 MED ORDER — METFORMIN HCL 500 MG PO TABS
500.0000 mg | ORAL_TABLET | Freq: Every day | ORAL | Status: DC
  Administered 2019-08-03 – 2019-08-05 (×3): 500 mg via ORAL
  Filled 2019-08-02 (×3): qty 1

## 2019-08-02 MED ORDER — FLUTICASONE PROPIONATE 50 MCG/ACT NA SUSP
2.0000 | NASAL | Status: DC | PRN
  Filled 2019-08-02: qty 16

## 2019-08-02 MED ORDER — DOFETILIDE 500 MCG PO CAPS
500.0000 ug | ORAL_CAPSULE | Freq: Two times a day (BID) | ORAL | Status: DC
  Administered 2019-08-02 – 2019-08-05 (×6): 500 ug via ORAL
  Filled 2019-08-02 (×6): qty 1

## 2019-08-02 MED ORDER — METOPROLOL SUCCINATE ER 25 MG PO TB24
25.0000 mg | ORAL_TABLET | Freq: Every day | ORAL | Status: DC
  Administered 2019-08-02 – 2019-08-03 (×2): 25 mg via ORAL
  Filled 2019-08-02 (×2): qty 1

## 2019-08-02 MED ORDER — BENAZEPRIL HCL 5 MG PO TABS: 5.0000 mg | ORAL_TABLET | Freq: Every day | ORAL | Status: DC

## 2019-08-02 MED ORDER — TRAMADOL HCL 50 MG PO TABS
50.0000 mg | ORAL_TABLET | Freq: Two times a day (BID) | ORAL | Status: DC
  Administered 2019-08-02 – 2019-08-05 (×6): 50 mg via ORAL
  Filled 2019-08-02 (×7): qty 1

## 2019-08-02 MED ORDER — SODIUM CHLORIDE 0.9 % IV SOLN: 250.0000 mL | INTRAVENOUS | Status: DC | PRN

## 2019-08-02 MED ORDER — FUROSEMIDE 40 MG PO TABS: 40.0000 mg | ORAL_TABLET | Freq: Every day | ORAL | Status: DC | PRN

## 2019-08-02 MED ORDER — SODIUM CHLORIDE 0.9% FLUSH: 3.0000 mL | INTRAVENOUS | Status: DC | PRN

## 2019-08-02 MED ORDER — SENNOSIDES-DOCUSATE SODIUM 8.6-50 MG PO TABS
1.0000 | ORAL_TABLET | ORAL | Status: DC | PRN
  Administered 2019-08-03: 1 via ORAL
  Filled 2019-08-02: qty 1

## 2019-08-02 MED ORDER — PANTOPRAZOLE SODIUM 40 MG PO TBEC
40.0000 mg | DELAYED_RELEASE_TABLET | Freq: Every morning | ORAL | Status: DC
  Administered 2019-08-03 – 2019-08-05 (×3): 40 mg via ORAL
  Filled 2019-08-02 (×3): qty 1

## 2019-08-02 MED ORDER — NITROGLYCERIN 0.4 MG SL SUBL: 0.4000 mg | SUBLINGUAL_TABLET | SUBLINGUAL | Status: DC | PRN

## 2019-08-02 MED ORDER — ACETAMINOPHEN 325 MG PO TABS: 650.0000 mg | ORAL_TABLET | ORAL | Status: DC | PRN

## 2019-08-02 MED ORDER — FERROUS SULFATE 325 (65 FE) MG PO TABS
325.0000 mg | ORAL_TABLET | Freq: Two times a day (BID) | ORAL | Status: DC
  Administered 2019-08-03 – 2019-08-05 (×5): 325 mg via ORAL
  Filled 2019-08-02 (×6): qty 1

## 2019-08-02 MED ORDER — SODIUM CHLORIDE 0.9% FLUSH
3.0000 mL | Freq: Two times a day (BID) | INTRAVENOUS | Status: DC
  Administered 2019-08-02 – 2019-08-04 (×5): 3 mL via INTRAVENOUS

## 2019-08-02 MED ORDER — TRAMADOL HCL ER 200 MG PO TB24: 200.0000 mg | ORAL_TABLET | Freq: Every day | ORAL | Status: DC

## 2019-08-02 MED ORDER — GABAPENTIN 100 MG PO CAPS
100.0000 mg | ORAL_CAPSULE | Freq: Every day | ORAL | Status: DC
  Administered 2019-08-02 – 2019-08-04 (×3): 100 mg via ORAL
  Filled 2019-08-02 (×3): qty 1

## 2019-08-02 MED ORDER — VITAMIN B-12 1000 MCG PO TABS
2000.0000 ug | ORAL_TABLET | Freq: Every day | ORAL | Status: DC
  Administered 2019-08-03 – 2019-08-05 (×3): 2000 ug via ORAL
  Filled 2019-08-02 (×3): qty 2

## 2019-08-02 NOTE — H&P (Signed)
See noted dated today from CRF  Assessment and Plan Atrial fibrillation-persistent-longstanding  ?  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

## 2019-08-02 NOTE — Progress Notes (Signed)
Pharmacy Review for Dofetilide (Tikosyn) Initiation  Admit Complaint: 74 y.o. male admitted 08/02/2019 with atrial fibrillation to be initiated on dofetilide.   Assessment:  Patient Exclusion Criteria: If any screening criteria checked as "Yes", then  patient  should NOT receive dofetilide until criteria item is corrected. If "Yes" please indicate correction plan.  YES  NO Patient  Exclusion Criteria Correction Plan  []  [x]  Baseline QTc interval is greater than or equal to 440 msec. IF above YES box checked dofetilide contraindicated unless patient has ICD; then may proceed if QTc 500-550 msec or with known ventricular conduction abnormalities may proceed with QTc 550-600 msec. QTc = 429   []  [x]  Magnesium level is less than 1.8 mEq/l : Last magnesium:  Lab Results  Component Value Date   MG 2.1 08/02/2019         []  [x]  Potassium level is less than 4 mEq/l : Last potassium:  Lab Results  Component Value Date   K 4.8 08/02/2019         []  [x]  Patient is known or suspected to have a digoxin level greater than 2 ng/ml: No results found for: DIGOXIN    []  [x]  Creatinine clearance less than 20 ml/min (calculated using Cockcroft-Gault, actual body weight and serum creatinine): Estimated Creatinine Clearance: 65.8 mL/min (A) (by C-G formula based on SCr of 1.29 mg/dL (H)).    []  [x]  Patient has received drugs known to prolong the QT intervals within the last 48 hours (phenothiazines, tricyclics or tetracyclic antidepressants, erythromycin, H-1 antihistamines, cisapride, fluoroquinolones, azithromycin). Drugs not listed above may have an, as yet, undetected potential to prolong the QT interval, updated information on QT prolonging agents is available at this website:QT prolonging agents    []  [x]  Patient received a dose of hydrochlorothiazide (Oretic) alone or in any combination including triamterene (Dyazide, Maxzide) in the last 48 hours.   []  [x]  Patient received a medication known to  increase dofetilide plasma concentrations prior to initial dofetilide dose:  . Trimethoprim (Primsol, Proloprim) in the last 36 hours . Verapamil (Calan, Verelan) in the last 36 hours or a sustained release dose in the last 72 hours . Megestrol (Megace) in the last 5 days  . Cimetidine (Tagamet) in the last 6 hours . Ketoconazole (Nizoral) in the last 24 hours . Itraconazole (Sporanox) in the last 48 hours  . Prochlorperazine (Compazine) in the last 36 hours Spoke with patient, no OTC medications, no medications from anywhere but [facility]    []  [x]  Patient is known to have a history of torsades de pointes; congenital or acquired long QT syndromes.   []  [x]  Patient has received a Class 1 antiarrhythmic with less than 2 half-lives since last dose. (Disopyramide, Quinidine, Procainamide, Lidocaine, Mexiletine, Flecainide, Propafenone)   []  [x]  Patient has received amiodarone therapy in the past 3 months or amiodarone level is greater than 0.3 ng/ml. Discontinued Sept 2020    Patient has been appropriately anticoagulated with apixaban.  Ordering provider was confirmed at [email or web address] if they are not listed on the [place] Prescribers list.  Goal of Therapy: Follow renal function, electrolytes, potential drug interactions, and dose adjustment. Provide education and 1 week supply at discharge.  Plan:  [x]   Physician selected initial dose within range recommended for patients level of renal function - will monitor for response.  []   Physician selected initial dose outside of range recommended for patients level of renal function - will discuss if the dose should be altered at this time.  Select One Calculated CrCl  Dose q12h  [x]  > 60 ml/min 500 mcg  []  40-60 ml/min 250 mcg  []  20-40 ml/min 125 mcg   2. Follow up QTc after the first 5 doses, renal function, electrolytes (K & Mg) daily x 3 days, dose adjustment, success of initiation and facilitate 1 week discharge supply as  clinically indicated.  3. Initiate Tikosyn education video (Call [phone] and ask for video # 116).  4. Place Enrollment Form on the chart for discharge supply of dofetilide.   Benetta Spar, PharmD, BCPS, BCCP Clinical Pharmacist  Please check AMION for all [place] phone numbers After 10:00 PM, call [place] [phone]

## 2019-08-02 NOTE — Plan of Care (Signed)

## 2019-08-03 LAB — MAGNESIUM: Magnesium: 2.1 mg/dL (ref 1.7–2.4)

## 2019-08-03 LAB — BASIC METABOLIC PANEL
Anion gap: 7 (ref 5–15)
BUN: 21 mg/dL (ref 8–23)
CO2: 31 mmol/L (ref 22–32)
Calcium: 9 mg/dL (ref 8.9–10.3)
Chloride: 102 mmol/L (ref 98–111)
Creatinine, Ser: 1.26 mg/dL — ABNORMAL HIGH (ref 0.61–1.24)
GFR calc Af Amer: >60 mL/min (ref >60)
GFR calc non Af Amer: 55 mL/min — ABNORMAL LOW (ref >60)
Glucose, Bld: 120 mg/dL — ABNORMAL HIGH (ref 70–99)
Potassium: 3.8 mmol/L (ref 3.5–5.1)
Sodium: 140 mmol/L (ref 135–145)

## 2019-08-03 MED ORDER — POTASSIUM CHLORIDE CRYS ER 20 MEQ PO TBCR
40.0000 meq | EXTENDED_RELEASE_TABLET | Freq: Once | ORAL | Status: AC
  Administered 2019-08-03: 40 meq via ORAL
  Filled 2019-08-03: qty 2

## 2019-08-03 MED ORDER — DILTIAZEM HCL ER COATED BEADS 180 MG PO CP24: 180.0000 mg | ORAL_CAPSULE | Freq: Every day | ORAL | Status: DC

## 2019-08-03 MED ORDER — PNEUMOCOCCAL VAC POLYVALENT 25 MCG/0.5ML IJ INJ
0.5000 mL | INJECTION | INTRAMUSCULAR | Status: AC
  Administered 2019-08-05: 0.5 mL via INTRAMUSCULAR
  Filled 2019-08-03: qty 0.5

## 2019-08-03 MED ORDER — DILTIAZEM HCL ER COATED BEADS 180 MG PO CP24
180.0000 mg | ORAL_CAPSULE | Freq: Every day | ORAL | Status: DC
  Administered 2019-08-03 – 2019-08-05 (×3): 180 mg via ORAL
  Filled 2019-08-03 (×3): qty 1

## 2019-08-03 NOTE — Progress Notes (Addendum)
Progress Note  Patient Name: [name] Date of Encounter: 08/03/2019  Primary Cardiologist: Candee Furbish, MD   Subjective   No CP or SOB, tolerating drug  Inpatient Medications    Scheduled Meds: . allopurinol  200 mg Oral Daily  . apixaban  5 mg Oral BID  . atorvastatin  40 mg Oral QHS  . benazepril  15 mg Oral Daily  . diltiazem  240 mg Oral BID  . dofetilide  500 mcg Oral BID  . ferrous sulfate  325 mg Oral BID WC  . folic acid  1 mg Oral Daily  . gabapentin  100 mg Oral QHS  . levothyroxine  112 mcg Oral Q0600  . metFORMIN  500 mg Oral Q breakfast  . metoprolol succinate  25 mg Oral QHS  . pantoprazole  40 mg Oral q morning - 10a  . potassium chloride  40 mEq Oral Once  . sodium chloride flush  3 mL Intravenous Q12H  . traMADol  50 mg Oral BID  . traMADol  200 mg Oral Daily  . cyanocobalamin  2,000 mcg Oral Daily   Continuous Infusions: . sodium chloride     PRN Meds: sodium chloride, acetaminophen, fluticasone, furosemide, nitroGLYCERIN, senna-docusate, sodium chloride flush   Vital Signs    Vitals:   08/02/19 2154 08/03/19 0001 08/03/19 0406 08/03/19 0407  BP: 126/83 124/75 (!) 99/49 (!) 99/49  Pulse: 75 (!) 57 93 62  Temp:  98.1 F (36.7 C) (!) 97.5 F (36.4 C) 97.9 F (36.6 C)  TempSrc:  Oral Oral Oral  SpO2:  98% 100% 100%  Weight:    109.8 kg  Height:       No intake or output data in the 24 hours ending 08/03/19 0941 Last 3 Weights 08/03/2019 08/02/2019 08/02/2019  Weight (lbs) 242 lb 2.1 oz 246 lb 9.6 oz 246 lb 9.6 oz  Weight (kg) 109.83 kg 111.857 kg 111.857 kg      Telemetry    AFib 40s overnight, 60's daytime - Personally Reviewed  ECG    AFib 78, reviewed with Dr. Caryl Comes - Personally Reviewed  Physical Exam   GEN: No acute distress.   Neck: No JVD Cardiac: irreg-irreg, no murmurs, rubs, or gallops.  Respiratory: CTA b/l. GI: Soft, nontender, non-distended  MS: No edema; No deformity. Neuro:  Nonfocal  Psych: Normal  affect   Labs    High Sensitivity Troponin:  No results for input(s): TROPONINIHS in the last 720 hours.    Chemistry Recent Labs  Lab 08/02/19 1149 08/03/19 0359  NA 140 140  K 4.8 3.8  CL 101 102  CO2 28 31  GLUCOSE 119* 120*  BUN 23 21  CREATININE 1.29* 1.26*  CALCIUM 9.4 9.0  GFRNONAA 53* 55*  GFRAA >60 >60  ANIONGAP 11 7     HematologyNo results for input(s): WBC, RBC, HGB, HCT, MCV, MCH, MCHC, RDW, PLT in the last 168 hours.  BNPNo results for input(s): BNP, PROBNP in the last 168 hours.   DDimer No results for input(s): DDIMER in the last 168 hours.   Radiology    No results found.  Cardiac Studies   Echo 02/18/18 demonstrated  - Left ventricle: The cavity size was normal. There was mild focal  basal hypertrophy of the septum. Systolic function was normal.  The estimated ejection fraction was in the range of 55% to 60%.  Wall motion was normal; there were no regional wall motion  abnormalities. Doppler parameters are consistent  with abnormal  left ventricular relaxation (grade 1 diastolic dysfunction).  Doppler parameters are consistent with elevated ventricular  end-diastolic filling pressure.  - Mitral valve: Calcified annulus. Mildly thickened leaflets .  There was mild regurgitation.  - Left atrium: The atrium was mildly dilated.  - Right ventricle: The cavity size was mildly dilated. Wall  thickness was normal. Systolic function was mildly reduced.  - Right atrium: The atrium was normal in size.  - Tricuspid valve: There was mild regurgitation.  - Pulmonary arteries: Systolic pressure was within the normal  range.  - Inferior vena cava: The vessel was normal in size.  - Pericardium, extracardiac: There was no pericardial effusion.    Patient Profile     74 y.o. 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 Persistent AFib     CHA2DS2Vasc is 7, on Eliquis, appropriately dosed     Tikosyn initiation is in progress     EKG reviewed by Dr. Caryl Comes, difficult to assess, continue Tikosyn same dose  DCCV tomorrow if not in SR, Dr. Caryl Comes d/w patient his is agreeable   2. CAD     No symptoms     Home meds  3. Chronic CHF (diastolic)     Does not appear volume OL currently  4. HTN     Home meds  5. DM     Home meds  6. OSA 7. Obesity     Intolerant of CPAP   For questions or updates, please contact [place] Please consult [email or web address] for contact info under        Signed, Baldwin Jamaica, PA-C  08/03/2019, 9:42 AM    As above   The issue as noted above is the QT interval of the difficulty in assessing the.  Hopefully in sinus rhythm will be easier to determine.  Anticipate cardioversion tomorrow (having said that, I have, in the interim, been informed that the patient has converted to sinus rhythm.  Euvolemic continue current meds

## 2019-08-03 NOTE — Care Management (Signed)
Per James Ivanoff B. W/Customer Care @[phone] opt.4  Co-pay amount forDofetiltde (TIKOSYN).5103mcg,250mcg,125mcg bid $60.00, for a 30 day supply.  No PA required No Deductible Tier 2  [facility].  Mail Order Pharmacy :[facility] for a 90 day supply $110.00.

## 2019-08-03 NOTE — Progress Notes (Signed)
Noted pt in SR.  SB in 76s.  EKG is done.  Idolina Primer, RN

## 2019-08-03 NOTE — Progress Notes (Signed)
Pharmacy: Dofetilide (Tikosyn) - Follow Up Assessment and Electrolyte Replacement  Pharmacy consulted to assist in monitoring and replacing electrolytes in this 74 y.o. male admitted on 08/02/2019 undergoing dofetilide initiation. First dofetilide dose: 08/02/19  Labs:    Component Value Date/Time   K 3.8 08/03/2019 0359   MG 2.1 08/03/2019 0359     Plan: Potassium: K 3.8-3.9:  Give KCl 40 mEq po x1   Magnesium: Mg > 2: No additional supplementation needed    Thank you for allowing pharmacy to participate in this patient's care   Arrie Senate, PharmD, BCPS Clinical Pharmacist [phone] Please check AMION for all [place] numbers 08/03/2019

## 2019-08-04 ENCOUNTER — Encounter (HOSPITAL_COMMUNITY)
Admission: RE | Disposition: A | Discharge status: Home / Self Care | Payer: Self-pay | Source: Ambulatory Visit | Attending: Internal Medicine

## 2019-08-04 LAB — GLUCOSE, CAPILLARY
Glucose-Capillary: 103 mg/dL — ABNORMAL HIGH (ref 70–99)
Glucose-Capillary: 96 mg/dL (ref 70–99)
Glucose-Capillary: 84 mg/dL (ref 70–99)
Glucose-Capillary: 109 mg/dL — ABNORMAL HIGH (ref 70–99)

## 2019-08-04 LAB — MAGNESIUM: Magnesium: 2.2 mg/dL (ref 1.7–2.4)

## 2019-08-04 LAB — BASIC METABOLIC PANEL
Anion gap: 9 (ref 5–15)
BUN: 20 mg/dL (ref 8–23)
CO2: 29 mmol/L (ref 22–32)
Calcium: 9.3 mg/dL (ref 8.9–10.3)
Chloride: 103 mmol/L (ref 98–111)
Creatinine, Ser: 1.14 mg/dL (ref 0.61–1.24)
GFR calc Af Amer: >60 mL/min (ref >60)
GFR calc non Af Amer: >60 mL/min (ref >60)
Glucose, Bld: 99 mg/dL (ref 70–99)
Potassium: 4.5 mmol/L (ref 3.5–5.1)
Sodium: 141 mmol/L (ref 135–145)

## 2019-08-04 SURGERY — CARDIOVERSION
Anesthesia: General

## 2019-08-04 NOTE — Progress Notes (Addendum)
Progress Note  Patient Name: [name] Date of Encounter: 08/04/2019  Primary Cardiologist: Candee Furbish, MD   Subjective   No sob and tolerating drug;  Cant tell he is in sinus   Inpatient Medications    Scheduled Meds: . allopurinol  200 mg Oral Daily  . apixaban  5 mg Oral BID  . atorvastatin  40 mg Oral QHS  . benazepril  15 mg Oral Daily  . diltiazem  180 mg Oral Daily  . dofetilide  500 mcg Oral BID  . ferrous sulfate  325 mg Oral BID WC  . folic acid  1 mg Oral Daily  . gabapentin  100 mg Oral QHS  . levothyroxine  112 mcg Oral Q0600  . metFORMIN  500 mg Oral Q breakfast  . metoprolol succinate  25 mg Oral QHS  . pantoprazole  40 mg Oral q morning - 10a  . pneumococcal 23 valent vaccine  0.5 mL Intramuscular Tomorrow-1000  . sodium chloride flush  3 mL Intravenous Q12H  . traMADol  50 mg Oral BID  . cyanocobalamin  2,000 mcg Oral Daily   Continuous Infusions: . sodium chloride     PRN Meds: sodium chloride, acetaminophen, fluticasone, furosemide, nitroGLYCERIN, senna-docusate, sodium chloride flush   Vital Signs    Vitals:   08/03/19 1708 08/03/19 2021 08/03/19 2311 08/04/19 0342  BP: 110/66 118/68 122/65 105/81  Pulse: 61 68 62 65  Resp: 18 19  20   Temp: 98.7 F (37.1 C) 98.6 F (37 C)  97.6 F (36.4 C)  TempSrc: Oral Oral  Oral  SpO2: 95% 97%  98%  Weight:    108.9 kg  Height:        Intake/Output Summary (Last 24 hours) at 08/04/2019 0744 Last data filed at 08/03/2019 1800 Gross per 24 hour  Intake 1203 ml  Output --  Net 1203 ml   Last 3 Weights 08/04/2019 08/03/2019 08/02/2019  Weight (lbs) 240 lb 1.6 oz 242 lb 2.1 oz 246 lb 9.6 oz  Weight (kg) 108.909 kg 109.83 kg 111.857 kg      Telemetry    AFib 40s overnight, 60's daytime - Personally Reviewed  ECG  Sinus reviewed with Dr. Caryl Comes - Personally Reviewed @QTc  about 410   Physical Exam   Well developed and nourished in no acute distress HENT normal Neck supple with JVP-   flat *  Clear Regular rate and rhythm, no murmurs or gallops Abd-soft with active BS No Clubbing cyanosis edema Skin-warm and dry A & Oriented  Grossly normal sensory and motor function      Labs    High Sensitivity Troponin:  No results for input(s): TROPONINIHS in the last 720 hours.    Chemistry Recent Labs  Lab 08/02/19 1149 08/03/19 0359 08/04/19 0336  NA 140 140 141  K 4.8 3.8 4.5  CL 101 102 103  CO2 28 31 29   GLUCOSE 119* 120* 99  BUN 23 21 20   CREATININE 1.29* 1.26* 1.14  CALCIUM 9.4 9.0 9.3  GFRNONAA 53* 55* >60  GFRAA >60 >60 >60  ANIONGAP 11 7 9      HematologyNo results for input(s): WBC, RBC, HGB, HCT, MCV, MCH, MCHC, RDW, PLT in the last 168 hours.  BNPNo results for input(s): BNP, PROBNP in the last 168 hours.   DDimer No results for input(s): DDIMER in the last 168 hours.   Radiology    No results found.  Cardiac Studies   Echo 02/18/18 demonstrated  -  Left ventricle: The cavity size was normal. There was mild focal  basal hypertrophy of the septum. Systolic function was normal.  The estimated ejection fraction was in the range of 55% to 60%.  Wall motion was normal; there were no regional wall motion  abnormalities. Doppler parameters are consistent with abnormal  left ventricular relaxation (grade 1 diastolic dysfunction).  Doppler parameters are consistent with elevated ventricular  end-diastolic filling pressure.  - Mitral valve: Calcified annulus. Mildly thickened leaflets .  There was mild regurgitation.  - Left atrium: The atrium was mildly dilated.  - Right ventricle: The cavity size was mildly dilated. Wall  thickness was normal. Systolic function was mildly reduced.  - Right atrium: The atrium was normal in size.  - Tricuspid valve: There was mild regurgitation.  - Pulmonary arteries: Systolic pressure was within the normal  range.  - Inferior vena cava: The vessel was normal in size.  - Pericardium,  extracardiac: There was no pericardial effusion.    Patient Profile     74 y.o. 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 Caryl Comes, difficult to assess, continue Tikosyn same dose   Sinus bradycardia   CAD   Chronic CHF (diastolic)   HTN   DM  OSA     Has converted to sinus on dofetilide and QT within range   Continue dofetilide at 500 and anticipate discharge in am  With bradycardia will stop metoprolol  K within range   For questions or updates, please contact [facility] Please consult [email or web address] for contact info under        Signed, Virl Axe, MD  08/04/2019, 7:44 AM

## 2019-08-04 NOTE — Plan of Care (Signed)

## 2019-08-04 NOTE — Progress Notes (Signed)
QTc 593 following tonight's dose of tikosyn.  Mag 2.2 and K+ 4.5.  NSR on telemetry with HR 60s.  VSS.  Patient denies complaints.  Dr. Alveta Heimlich notified.  EKG in am prior to next tikosyn dose per MD order.  Will continue to monitor.  [name]

## 2019-08-04 NOTE — Care Management (Signed)
[id number] 08-04-19 Patient presented for Tikosyn Load! Benefits check submitted and patient is aware of cost. Patient uses [facility] and wants Rx with refills sent to this location. No further needs from Case Manager at this time. Bethena Roys, RN,BSN Case Manager

## 2019-08-04 NOTE — Progress Notes (Signed)
Pharmacy: Dofetilide (Tikosyn) - Follow Up Assessment and Electrolyte Replacement  Pharmacy consulted to assist in monitoring and replacing electrolytes in this 74 y.o. male admitted on 08/02/2019 undergoing dofetilide initiation. First dofetilide dose: 08/02/19  Labs:    Component Value Date/Time   K 4.5 08/04/2019 0336   MG 2.2 08/04/2019 [id number]     Plan: Potassium: K >/= 4: No additional supplementation needed  Magnesium: Mg > 2: No additional supplementation needed    Thank you for allowing pharmacy to participate in this patient's care   Arrie Senate, PharmD, BCPS Clinical Pharmacist [phone] Please check AMION for all [place] numbers 08/04/2019

## 2019-08-05 ENCOUNTER — Other Ambulatory Visit: Payer: Self-pay | Admitting: *Deleted

## 2019-08-05 DIAGNOSIS — I493 Ventricular premature depolarization: Secondary | ICD-10-CM

## 2019-08-05 DIAGNOSIS — I429 Cardiomyopathy, unspecified: Secondary | ICD-10-CM

## 2019-08-05 LAB — BASIC METABOLIC PANEL
Anion gap: 7 (ref 5–15)
BUN: 16 mg/dL (ref 8–23)
CO2: 31 mmol/L (ref 22–32)
Calcium: 9.6 mg/dL (ref 8.9–10.3)
Chloride: 105 mmol/L (ref 98–111)
Creatinine, Ser: 1.1 mg/dL (ref 0.61–1.24)
GFR calc Af Amer: >60 mL/min (ref >60)
GFR calc non Af Amer: >60 mL/min (ref >60)
Glucose, Bld: 98 mg/dL (ref 70–99)
Potassium: 4 mmol/L (ref 3.5–5.1)
Sodium: 143 mmol/L (ref 135–145)

## 2019-08-05 LAB — MAGNESIUM: Magnesium: 2.1 mg/dL (ref 1.7–2.4)

## 2019-08-05 MED ORDER — DILTIAZEM HCL ER COATED BEADS 180 MG PO CP24: 180.0000 mg | ORAL_CAPSULE | Freq: Every day | ORAL | 6 refills | Status: DC

## 2019-08-05 MED ORDER — DOFETILIDE 500 MCG PO CAPS: 500.0000 ug | ORAL_CAPSULE | Freq: Two times a day (BID) | ORAL | 6 refills | Status: DC

## 2019-08-05 NOTE — Care Management Important Message (Signed)
Important Message  Patient Details  Name: [name] MRN: [id number] Date of Birth: 11/14/1945   Medicare Important Message Given:  Yes     Shelda Altes 08/05/2019, 11:01 AM

## 2019-08-05 NOTE — Discharge Summary (Addendum)
ELECTROPHYSIOLOGY PROCEDURE DISCHARGE SUMMARY    Patient ID: [name],  MRN: [id number], DOB/AGE: Aug 21, 1945 74 y.o.  Admit date: 08/02/2019 Discharge date: 08/05/2019  Primary Care Physician: [name] Alar, NP  Primary Cardiologist: Dr. Marlou Porch Electrophysiologist: Dr. Caryl Comes  Primary Discharge Diagnosis:  1.  persistent atrial fibrillation status post Tikosyn loading this admission      CHA2DS2Vasc is 7, on Eliquis, appropriately dosed  Secondary Discharge Diagnosis:  1. CAD 2. Chronic CHF (diastolic) 3. HTN 4. DM 5. Obesity 6. OSA     Intolerant of CPAP    No Known Allergies   Procedures This Admission:  1.  Tikosyn loading   Brief HPI: [name] is a 74 y.o. male with a past medical history as noted above.  He is followed by Dr. Caryl Comes in the outpatient setting for treatment options of atrial fibrillation.  Risks, benefits, and alternatives to Tikosyn were reviewed with the patient who wished to proceed.    Hospital Course:  The patient was admitted and Tikosyn was initiated.  Renal function and electrolytes were followed during the hospitalization.  His QTc remained stable.  He converted with drug and did not require DCCV, maintaining SR/SB.  He did have SB 40's nocturnal and 50's day time, his diltiazem dose reduced, to nocturnal 50's and daytime 60's. On the day of discharge he feels well, was examined by [name] who considered the patient stable for discharge to home.  Follow-up has been arranged with the AFib clinic in 1 week and EP service in 4 weeks.    Tikosyn teaching is completed I have confirmed with his pharmacy has drug in stock   Physical Exam: Vitals:   08/04/19 1503 08/04/19 2108 08/05/19 0600 08/05/19 0736  BP: (!) 123/59 113/64 131/66 (!) 142/74  Pulse: (!) 58 66 (!) 55 66  Resp: 20 18 18 18   Temp: 97.9 F (36.6 C) 98.2 F (36.8 C) 98.1 F (36.7 C) 97.8 F (36.6 C)  TempSrc: Oral Oral Oral Oral  SpO2: 99% 97% 100%  100%  Weight:   107.5 kg   Height:        GEN- The patient is well appearing, alert and oriented x 3 today.   HEENT: normocephalic, atraumatic; sclera clear, conjunctiva pink; hearing intact; oropharynx clear; neck supple, no JVP Lymph- no cervical lymphadenopathy Lungs- CTA b/l normal work of breathing.  No wheezes, rales, rhonchi Heart- Regular rate and rhythm, no murmurs, rubs or gallops, PMI not laterally displaced GI- soft, non-tender, non-distended, bowel sounds present, no hepatosplenomegaly Extremities- no clubbing, cyanosis, or edema; DP/PT/radial pulses 2+ bilaterally MS- no significant deformity or atrophy Skin- warm and dry, no rash or lesion Psych- euthymic mood, full affect Neuro- strength and sensation are intact   Labs:   Lab Results  Component Value Date   WBC 7.3 04/14/2019   HGB 13.6 04/14/2019   HCT 39.4 04/14/2019   MCV 88 04/14/2019   PLT 158 04/14/2019    Recent Labs  Lab 08/05/19 0323  NA 143  K 4.0  CL 105  CO2 31  BUN 16  CREATININE 1.10  CALCIUM 9.6  GLUCOSE 98     Discharge Medications:  Allergies as of 08/05/2019   No Known Allergies      Medication List     TAKE these medications    Accu-Chek FastClix Lancets Misc USE TO TEST 3 TIMES DAILY   Accu-Chek Guide test strip Generic drug: glucose blood USE AS DIRECTED UP TO  4 TIMES DAILY   acetaminophen 325 MG tablet Commonly known as: TYLENOL Take 2 tablets (650 mg total) by mouth every 4 (four) hours as needed for headache or mild pain.   allopurinol 100 MG tablet Commonly known as: ZYLOPRIM TAKE 2 TABLETS BY MOUTH EVERY DAY   apixaban 5 MG Tabs tablet Commonly known as: ELIQUIS Take 1 tablet (5 mg total) by mouth 2 (two) times daily.   atorvastatin 40 MG tablet Commonly known as: LIPITOR Take 1 tablet (40 mg total) by mouth daily.   benazepril 5 MG tablet Commonly known as: LOTENSIN TAKE 1 TABLET BY MOUTH DAILY WITH 10MG OF BENAZEPRIL TO EQUAL 15MG DAILY     benazepril 10 MG tablet Commonly known as: LOTENSIN Take 1 tablet (10 mg total) by mouth daily.   blood glucose meter kit and supplies Kit Dispense based on patient and insurance preference. Use up to four times daily as directed. (FOR ICD-9 250.00, 250.01).   cyanocobalamin 1000 MCG tablet Take 2,000 mcg by mouth daily. Take 2 Gummies daily   diltiazem 180 MG 24 hr capsule Commonly known as: CARDIZEM CD Take 1 capsule (180 mg total) by mouth daily. What changed:  medication strength how much to take when to take this   dofetilide 500 MCG capsule Commonly known as: TIKOSYN Take 1 capsule (500 mcg total) by mouth 2 (two) times daily.   ferrous sulfate 325 (65 FE) MG tablet Take 1 tablet (325 mg total) by mouth 2 (two) times daily with a meal. What changed:  how much to take when to take this   fluticasone 50 MCG/ACT nasal spray Commonly known as: FLONASE Place 2 sprays into both nostrils as needed for allergies or rhinitis.   folic acid 1 MG tablet Commonly known as: FOLVITE Take 1 tablet (1 mg total) by mouth daily.   furosemide 40 MG tablet Commonly known as: LASIX TAKE 1 TABLET BY MOUTH DAILY AS NEEDED FOR EDEMA (EDEMA AND WEIGHT GAIN).   gabapentin 100 MG capsule Commonly known as: NEURONTIN Take 1 capsule (100 mg total) by mouth at bedtime.   levothyroxine 112 MCG tablet Commonly known as: SYNTHROID Take 1 tablet (112 mcg total) by mouth daily before breakfast.   metFORMIN 500 MG tablet Commonly known as: GLUCOPHAGE Take 1 tablet (500 mg total) by mouth daily with breakfast.   metoprolol succinate 25 MG 24 hr tablet Commonly known as: Toprol XL Take 1 tablet (25 mg total) by mouth daily. What changed: when to take this   MULTIVITAMIN GUMMIES MENS PO Take 2 tablets by mouth daily with breakfast.   nitroGLYCERIN 0.4 MG SL tablet Commonly known as: NITROSTAT Place 1 tablet (0.4 mg total) under the tongue every 5 (five) minutes as needed for chest pain.  Up to 3 doses   OXYGEN Inhale 2 L into the lungs at bedtime.   pantoprazole 40 MG tablet Commonly known as: PROTONIX Take 1 tablet (40 mg total) by mouth every morning.   sennosides-docusate sodium 8.6-50 MG tablet Commonly known as: SENOKOT-S Take 1 tablet by mouth as needed for constipation.   traMADol 200 MG 24 hr tablet Commonly known as: ULTRAM-ER Take 1 tablet (200 mg total) by mouth daily.   traMADol 50 MG tablet Commonly known as: ULTRAM Take 1 tablet (50 mg total) by mouth 2 (two) times daily.        Disposition:  Home Discharge Instructions     Diet - low sodium heart healthy   Complete by: As directed  Increase activity slowly   Complete by: As directed       Follow-up Information     [facility] Follow up.   Specialty: Cardiology Why: 08/12/2019 @ 9:30AM Contact information: [address] [id number] [place] [address] [phone]        Shirley Friar, PA-C Follow up.   Specialty: Physician Assistant Why: 09/12/2019 @ 11;20 (for Dr. Caryl Comes) Contact information: [place] [address] [phone]            Duration of Discharge Encounter: Greater than 30 minutes including physician time.  Signed, Tommye Standard, PA-C 08/05/2019 11:10 AM  As above  Tolerating dofetrilide with spontaneous conversion and will need to reassess his PVC burden as outpt

## 2019-08-05 NOTE — Plan of Care (Signed)

## 2019-08-05 NOTE — Progress Notes (Signed)
Pharmacy: Dofetilide (Tikosyn) - Follow Up Assessment and Electrolyte Replacement  Pharmacy consulted to assist in monitoring and replacing electrolytes in this 74 y.o. male admitted on 08/02/2019 undergoing dofetilide initiation. First dofetilide dose: 08/02/19  Labs:    Component Value Date/Time   K 4.0 08/05/2019 0323   MG 2.1 08/05/2019 0323     Plan: Potassium: K >/= 4: No additional supplementation needed  Magnesium: Mg > 2: No additional supplementation needed    Thank you for allowing pharmacy to participate in this patient's care   Arrie Senate, PharmD, BCPS Clinical Pharmacist [phone] Please check AMION for all [place] numbers 08/05/2019

## 2019-08-09 ENCOUNTER — Ambulatory Visit (INDEPENDENT_AMBULATORY_CARE_PROVIDER_SITE_OTHER): Payer: Medicare Other

## 2019-08-09 ENCOUNTER — Ambulatory Visit (INDEPENDENT_AMBULATORY_CARE_PROVIDER_SITE_OTHER): Payer: Medicare Other | Admitting: Orthopaedic Surgery

## 2019-08-09 ENCOUNTER — Other Ambulatory Visit: Payer: Self-pay

## 2019-08-09 ENCOUNTER — Encounter: Payer: Self-pay | Admitting: Orthopaedic Surgery

## 2019-08-09 DIAGNOSIS — Z9861 Coronary angioplasty status: Secondary | ICD-10-CM | POA: Diagnosis not present

## 2019-08-09 DIAGNOSIS — I2510 Atherosclerotic heart disease of native coronary artery without angina pectoris: Secondary | ICD-10-CM | POA: Diagnosis not present

## 2019-08-09 DIAGNOSIS — M1811 Unilateral primary osteoarthritis of first carpometacarpal joint, right hand: Secondary | ICD-10-CM

## 2019-08-09 MED ORDER — METHYLPREDNISOLONE 4 MG PO TBPK: ORAL_TABLET | ORAL | 0 refills | Status: DC

## 2019-08-09 NOTE — Progress Notes (Signed)
Office Visit Note   Patient: [name]           Date of Birth: 1945-03-29           MRN: [id number] Visit Date: 08/09/2019              Requested by: Debbrah Alar, NP [address] [place],  [place] [address] PCP: Debbrah Alar, NP   Assessment & Plan: Visit Diagnoses:  1. Primary osteoarthritis of first carpometacarpal joint of right hand     Plan: Impression is advanced right basal joint arthrosis.  Based on the acute nature of it I have recommended CMC support brace as well as a Medrol Dosepak and Voltaren gel as an initial line of treatment.  Patient was also made aware of potential cortisone injection should he not get much relief from this initial treatment.  Questions encouraged and answered.  Follow-up as needed.  Follow-Up Instructions: Return if symptoms worsen or fail to improve.   Orders:  Orders Placed This Encounter  Procedures  . XR Wrist Complete Right   Meds ordered this encounter  Medications  . methylPREDNISolone (MEDROL DOSEPAK) 4 MG TBPK tablet    Sig: Use as directed    Dispense:  21 tablet    Refill:  0      Procedures: No procedures performed   Clinical Data: No additional findings.   Subjective: Chief Complaint  Patient presents with  . Right Wrist - Pain    Bryler is a 74 year old gentleman who is the husband of [name].  He comes in for 3 weeks.  He comes in for evaluation of 3 weeks of pain and swelling at the base of his thumb.  Denies any injuries.  He is concerned that he may have a ganglion cyst.  He uses his right hand most the time ever since he had a stroke in 1996.  Walks with a rolling walker.   Review of Systems  Constitutional: Negative.   All other systems reviewed and are negative.    Objective: Vital Signs: There were no vitals taken for this visit.  Physical Exam Vitals and nursing note reviewed.  Constitutional:      Appearance: He is well-developed.  HENT:     Head:  Normocephalic and atraumatic.  Eyes:     Pupils: Pupils are equal, round, and reactive to light.  Pulmonary:     Effort: Pulmonary effort is normal.  Abdominal:     Palpations: Abdomen is soft.  Musculoskeletal:        General: Normal range of motion.     Cervical back: Neck supple.  Skin:    General: Skin is warm.  Neurological:     Mental Status: He is alert and oriented to person, place, and time.  Psychiatric:        Behavior: Behavior normal.        Thought Content: Thought content normal.        Judgment: Judgment normal.     Ortho Exam Right hand shows a positive grind test.  No evidence of ganglion cyst.  Negative Finkelstein's. Specialty Comments:  No specialty comments available.  Imaging: XR Wrist Complete Right  Result Date: 08/09/2019 Advanced basal joint arthrosis    PMFS History: Patient Active Problem List   Diagnosis Date Noted  . Primary osteoarthritis of first carpometacarpal joint of right hand 08/09/2019  . Secondary hypercoagulable state ([place]) 08/02/2019  . External hemorrhoid 05/19/2019  . Chest wall hematoma  12/16/2018  . Delirium 12/16/2018  . Blood loss anemia 12/16/2018  . Dysuria 12/16/2018  . Acute encephalopathy 12/16/2018  . Acute on chronic diastolic heart failure ([place])   . Anticoagulated on warfarin   . Elevated troponin   . Congestive heart failure ([place])   . Abnormal INR   . AKI (acute kidney injury) ([place])   . History of gout   . History of hypothyroidism   . History of type 2 diabetes mellitus   . Atrial fibrillation with rapid ventricular response ([place]) 11/27/2018  . Ischemic chest pain ([place]) 02/17/2018  . Pure hypercholesterolemia 05/01/2017  . Thoracic aorta atherosclerosis ([place]) 04/27/2017  . Stroke (cerebrum) ([place]) 01/13/2017  . RUQ abdominal pain 01/13/2017  . Paroxysmal atrial fibrillation ([place]) 12/06/2016  . Dyspnea on exertion 10/21/2016  . Acute respiratory failure with hypoxia and hypercapnia ([place]) 12/12/2015    . Coronary 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 ([place]) 11/04/2015  . CAD S/P CFX DES 2011 04/25/2015  . Longstanding persistent atrial fibrillation ([place]) 04/25/2015  . Vasovagal syncope   . Atypical chest pain 04/24/2015  . Constipation 04/24/2015  . Chronic diastolic heart failure ([place]) 04/24/2015  . B12 deficiency 08/11/2013  . Sensory disturbance 08/11/2013  . Ataxia 08/10/2013  . Laceration of ear, external, right 07/05/2013  . Insomnia 05/21/2013  . Hypothyroidism, acquired 02/15/2013  . Weakness 11/17/2012  . Spastic hemiplegia affecting dominant side ([place]) 07/21/2011  . Long term current use of anticoagulant therapy 06/24/2011  . Hypogonadism male 04/10/2011  . Fatigue 03/31/2011  . Allergic rhinitis 02/13/2010  . BENIGN POSITIONAL VERTIGO 11/23/2009  . 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 Hypothyroidism 04/02/2009  . Back pain 01/29/2009  . Type 2 diabetes mellitus without complication, without long-term current use of insulin ([place]) 10/05/2007  . Obstructive sleep apnea-Failed CPAP 06/17/2007  . Hyperlipidemia LDL goal <70 12/12/2006  . GOUT 12/12/2006  . Essential hypertension 12/12/2006  . History of stroke with residual deficit 12/12/2006  . GERD 12/12/2006   Past Medical History:  Diagnosis Date  . Acute encephalopathy 12/12/2015  . Acute respiratory failure with hypoxia and hypercapnia ([place]) 12/12/2015  . Allergic rhinitis 02/13/2010   Qualifier: Diagnosis of  By: Wynona Luna   . Angina at rest [facility]) 10/22/2015  . Ataxia 08/10/2013  . Atrial fibrillation ([place]) 11/2018  . B12 deficiency 08/11/2013  . BACK PAIN, LUMBAR 01/29/2009   Qualifier: Diagnosis of  By: Wynona Luna   . BENIGN POSITIONAL VERTIGO 11/23/2009   Qualifier: Diagnosis of  By: Wynona Luna   . Chronic diastolic heart failure ([place]) 04/24/2015  . Chronic pain    left sided-[place]  . Coronary 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 ([place]) 11/04/2015    s/p CABG 2004; CFX DES 2011  . Essential hypertension 12/12/2006   Qualifier: Diagnosis of  By: [name] RMA, Lucy    . GERD 12/12/2006   Qualifier: Diagnosis of  By: [name] RMA, Lucy    . GOUT 12/12/2006   Qualifier: Diagnosis of  By: Reatha Armour, Lucy    . Hyperlipidemia LDL goal <70 12/12/2006   Qualifier: Diagnosis of  By: [name] RMA, Lucy     . Hypogonadism male 04/10/2011  . Hypothyroidism, acquired 02/15/2013  . Insomnia 05/21/2013  . Long term current use of anticoagulant therapy 06/24/2011  . Obstructive sleep apnea-Failed CPAP 06/17/2007   Failed CPAP Using O 2 for sleep   . Paroxysmal atrial fibrillation ([place]) 12/06/2016  .  Stroke (cerebrum) ([place]) 01/13/2017   also 1996; resultant hemiplegia of dominant side  . Thoracic aorta atherosclerosis ([place]) 04/27/2017  . Tubular adenoma of colon 05/2010  . Type 2 diabetes mellitus without complication, without long-term current use of insulin ([place]) 10/05/2007   Qualifier: Diagnosis of  By: Wynona Luna   . Vasovagal syncope     Family History  Problem Relation Age of Onset  . Lung cancer Father        deceased  . Stroke Mother        deceased-MINISTROKES    Past Surgical History:  Procedure Laterality Date  . CARDIAC CATHETERIZATION N/A 10/25/2015   Procedure: Left Heart Cath and Cors/Grafts Angiography;  Surgeon: Troy Sine, MD;  Location: [facility];  Service: Cardiovascular;  Laterality: N/A;  . CARDIOVERSION  06/20/2011   Procedure: CARDIOVERSION;  Surgeon: Larey Dresser, MD;  Location: [facility] ENDOSCOPY;  Service: Cardiovascular;  Laterality: N/A;  . CARDIOVERSION N/A 04/26/2015   Procedure: CARDIOVERSION;  Surgeon: Dorothy Spark, MD;  [facility];  Service: Cardiovascular;  Laterality: N/A;  . CARDIOVERSION N/A 05/10/2015   Procedure: CARDIOVERSION;  Surgeon: Larey Dresser, MD;  Location: [facility];  Service: Cardiovascular;  Laterality: N/A;  . CARDIOVERSION N/A 12/10/2018   Procedure: CARDIOVERSION;  Surgeon:  Sanda Klein, MD;  Location: [facility];  Service: Cardiovascular;  Laterality: N/A;  . CATARACT EXTRACTION  05/2010   left eye  . CATARACT EXTRACTION  04/2010   right eye  . CORONARY ARTERY BYPASS GRAFT     stent  . ESOPHAGOGASTRODUODENOSCOPY  03-25-2005  . LEFT HEART CATH AND CORS/GRAFTS ANGIOGRAPHY N/A 02/19/2018   Procedure: LEFT HEART CATH AND CORS/GRAFTS ANGIOGRAPHY;  Surgeon: Martinique, Peter M, MD;  Location: [facility];  Service: Cardiovascular;  Laterality: N/A;  . NASAL SEPTUM SURGERY    . PERCUTANEOUS PLACEMENT INTRAVASCULAR STENT CERVICAL CAROTID ARTERY     03-2009; using a drug-eluting platform of the circumflex cornoray artery with a 3.0 x 18 [employer] Promus drug-eluting platform post dilated to 3.75 with a noncompliant balloon.  . TEE WITHOUT CARDIOVERSION  06/20/2011   Procedure: TRANSESOPHAGEAL ECHOCARDIOGRAM (TEE);  Surgeon: Larey Dresser, MD;  Location: [facility];  Service: Cardiovascular;  Laterality: N/A;   Social History   Occupational History  . 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 Smoking status: Never Smoker  . Smokeless tobacco: Never Used  Substance and Sexual Activity  . Alcohol use: No    Alcohol/week: 0.0 standard drinks    Comment: 1 beer a month  . Drug use: No  . Sexual activity: Not Currently

## 2019-08-12 ENCOUNTER — Other Ambulatory Visit: Payer: Self-pay

## 2019-08-12 ENCOUNTER — Ambulatory Visit (HOSPITAL_COMMUNITY)
Admit: 2019-08-12 | Discharge: 2019-08-12 | Disposition: A | Discharge status: Home / Self Care | Payer: Medicare Other | Source: Ambulatory Visit | Attending: Physician Assistant | Admitting: Physician Assistant

## 2019-08-12 VITALS — BP 100/60 | HR 57 | Ht 75.0 in | Wt 241.4 lb

## 2019-08-12 DIAGNOSIS — E669 Obesity, unspecified: Secondary | ICD-10-CM | POA: Diagnosis not present

## 2019-08-12 DIAGNOSIS — I4819 Other persistent atrial fibrillation: Secondary | ICD-10-CM | POA: Diagnosis not present

## 2019-08-12 DIAGNOSIS — E538 Deficiency of other specified B group vitamins: Secondary | ICD-10-CM | POA: Insufficient documentation

## 2019-08-12 DIAGNOSIS — M109 Gout, unspecified: Secondary | ICD-10-CM | POA: Diagnosis not present

## 2019-08-12 DIAGNOSIS — Z7984 Long term (current) use of oral hypoglycemic drugs: Secondary | ICD-10-CM | POA: Insufficient documentation

## 2019-08-12 DIAGNOSIS — Z823 Family history of stroke: Secondary | ICD-10-CM | POA: Insufficient documentation

## 2019-08-12 DIAGNOSIS — Z79890 Hormone replacement therapy (postmenopausal): Secondary | ICD-10-CM | POA: Diagnosis not present

## 2019-08-12 DIAGNOSIS — Z951 Presence of aortocoronary bypass graft: Secondary | ICD-10-CM | POA: Diagnosis not present

## 2019-08-12 DIAGNOSIS — Z6830 Body mass index (BMI) 30.0-30.9, adult: Secondary | ICD-10-CM | POA: Diagnosis not present

## 2019-08-12 DIAGNOSIS — E119 Type 2 diabetes mellitus without complications: Secondary | ICD-10-CM | POA: Insufficient documentation

## 2019-08-12 DIAGNOSIS — Z7901 Long term (current) use of anticoagulants: Secondary | ICD-10-CM | POA: Diagnosis not present

## 2019-08-12 DIAGNOSIS — K219 Gastro-esophageal reflux disease without esophagitis: Secondary | ICD-10-CM | POA: Diagnosis not present

## 2019-08-12 DIAGNOSIS — Z955 Presence of coronary angioplasty implant and graft: Secondary | ICD-10-CM | POA: Diagnosis not present

## 2019-08-12 DIAGNOSIS — D6869 Other thrombophilia: Secondary | ICD-10-CM | POA: Diagnosis not present

## 2019-08-12 DIAGNOSIS — I4891 Unspecified atrial fibrillation: Secondary | ICD-10-CM | POA: Diagnosis present

## 2019-08-12 DIAGNOSIS — Z801 Family history of malignant neoplasm of trachea, bronchus and lung: Secondary | ICD-10-CM | POA: Diagnosis not present

## 2019-08-12 DIAGNOSIS — Z79899 Other long term (current) drug therapy: Secondary | ICD-10-CM | POA: Insufficient documentation

## 2019-08-12 DIAGNOSIS — I2510 Atherosclerotic heart disease of native coronary artery without angina pectoris: Secondary | ICD-10-CM | POA: Diagnosis not present

## 2019-08-12 DIAGNOSIS — E039 Hypothyroidism, unspecified: Secondary | ICD-10-CM | POA: Insufficient documentation

## 2019-08-12 DIAGNOSIS — Z8673 Personal history of transient ischemic attack (TIA), and cerebral infarction without residual deficits: Secondary | ICD-10-CM | POA: Diagnosis not present

## 2019-08-12 DIAGNOSIS — I4811 Longstanding persistent atrial fibrillation: Secondary | ICD-10-CM

## 2019-08-12 DIAGNOSIS — I110 Hypertensive heart disease with heart failure: Secondary | ICD-10-CM | POA: Insufficient documentation

## 2019-08-12 DIAGNOSIS — I5032 Chronic diastolic (congestive) heart failure: Secondary | ICD-10-CM | POA: Insufficient documentation

## 2019-08-12 DIAGNOSIS — G4733 Obstructive sleep apnea (adult) (pediatric): Secondary | ICD-10-CM | POA: Diagnosis not present

## 2019-08-12 DIAGNOSIS — E785 Hyperlipidemia, unspecified: Secondary | ICD-10-CM | POA: Diagnosis not present

## 2019-08-12 LAB — BASIC METABOLIC PANEL
Anion gap: 11 (ref 5–15)
BUN: 20 mg/dL (ref 8–23)
CO2: 28 mmol/L (ref 22–32)
Calcium: 9.5 mg/dL (ref 8.9–10.3)
Chloride: 100 mmol/L (ref 98–111)
Creatinine, Ser: 1.18 mg/dL (ref 0.61–1.24)
GFR calc Af Amer: >60 mL/min (ref >60)
GFR calc non Af Amer: 60 mL/min — ABNORMAL LOW (ref >60)
Glucose, Bld: 128 mg/dL — ABNORMAL HIGH (ref 70–99)
Potassium: 4.3 mmol/L (ref 3.5–5.1)
Sodium: 139 mmol/L (ref 135–145)

## 2019-08-12 LAB — MAGNESIUM: Magnesium: 2.1 mg/dL (ref 1.7–2.4)

## 2019-08-12 NOTE — Progress Notes (Signed)
Primary Care Physician: Debbrah Alar, NP Primary Cardiologist: [name] Primary Electrophysiologist: [name] [name] is a 74 y.o. 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 Patient is on Eliquis for a CHADS2VASC score of 6. He was seen by [name] on 06/28/19 and dofetilide was recommended.   On follow up today, patient is s/p dofetilide loading 5/18-5/21/21. He converted with the medication and did not require DCCV. He did have some episodes of bradycardia and his diltiazem was reduced. He reports that he has more energy since converting to SR. He has been able to walk farther. He is tolerating the medication without difficulty.   Today, he denies symptoms of palpitations, chest pain, shortness of breath, orthopnea, PND, lower extremity edema, dizziness, presyncope, syncope, snoring, daytime somnolence, bleeding, or neurologic sequela. The patient is tolerating medications without difficulties and is otherwise without complaint today.    Atrial Fibrillation Risk Factors:  he does have symptoms or diagnosis of sleep apnea. he is not compliant with CPAP therapy.   he has a BMI of Body mass index is 30.17 kg/m.Marland Kitchen Filed Weights   08/12/19 1029  Weight: 109.5 kg    Family History  Problem Relation Age of Onset  . Lung cancer Father        deceased  . Stroke Mother        deceased-MINISTROKES     Atrial Fibrillation Management history:  Previous antiarrhythmic drugs: amiodarone, dofetilide  Previous cardioversions: several, most recently 12/10/18 Previous ablations: none CHADS2VASC score: 6 Anticoagulation history: Eliquis   Past Medical History:  Diagnosis Date  . Acute encephalopathy 12/12/2015  . Acute respiratory failure  with hypoxia and hypercapnia ([place]) 12/12/2015  . Allergic rhinitis 02/13/2010   Qualifier: Diagnosis of  By: Wynona Luna   . Angina at rest [facility]) 10/22/2015  . Ataxia 08/10/2013  . Atrial fibrillation ([place]) 11/2018  . B12 deficiency 08/11/2013  . BACK PAIN, LUMBAR 01/29/2009   Qualifier: Diagnosis of  By: Wynona Luna   . BENIGN POSITIONAL VERTIGO 11/23/2009   Qualifier: Diagnosis of  By: Wynona Luna   . Chronic diastolic heart failure ([place]) 04/24/2015  . Chronic pain    left sided-[place]  . Coronary 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 ([place]) 11/04/2015   s/p CABG 2004; CFX DES 2011  . Essential hypertension 12/12/2006   Qualifier: Diagnosis of  By: [name] RMA, Lucy    . GERD 12/12/2006   Qualifier: Diagnosis of  By: [name] RMA, Lucy    . GOUT 12/12/2006   Qualifier: Diagnosis of  By: Reatha Armour, Lucy    . Hyperlipidemia LDL goal <70 12/12/2006   Qualifier: Diagnosis of  By: [name] RMA, Lucy     . Hypogonadism male 04/10/2011  . Hypothyroidism, acquired 02/15/2013  . Insomnia 05/21/2013  . Long term current use of anticoagulant therapy 06/24/2011  . Obstructive sleep apnea-Failed CPAP 06/17/2007   Failed CPAP Using O 2 for sleep   . Paroxysmal atrial fibrillation ([place]) 12/06/2016  . Stroke (cerebrum) ([place]) 01/13/2017   also 1996; resultant hemiplegia of dominant side  . Thoracic aorta atherosclerosis ([place]) 04/27/2017  . Tubular adenoma of colon 05/2010  . Type 2 diabetes mellitus without complication, without long-term current use of  insulin ([place]) 10/05/2007   Qualifier: Diagnosis of  By: Wynona Luna   . Vasovagal syncope    Past Surgical History:  Procedure Laterality Date  . CARDIAC CATHETERIZATION N/A 10/25/2015   Procedure: Left Heart Cath and Cors/Grafts Angiography;  Surgeon: Troy Sine, MD;  Location: [place] CV LAB;  Service: Cardiovascular;  Laterality: N/A;  . CARDIOVERSION  06/20/2011   Procedure: CARDIOVERSION;  Surgeon: Larey Dresser, MD;  Location[facility] ENDOSCOPY;  Service: Cardiovascular;  Laterality: N/A;  . CARDIOVERSION N/A 04/26/2015   Procedure: CARDIOVERSION;  Surgeon: Dorothy Spark, MD;  Location: [facility] ENDOSCOPY;  Service: Cardiovascular;  Laterality: N/A;  . CARDIOVERSION N/A 05/10/2015   Procedure: CARDIOVERSION;  Surgeon: Larey Dresser, MD;  Location: [place];  Service: Cardiovascular;  Laterality: N/A;  . CARDIOVERSION N/A 12/10/2018   Procedure: CARDIOVERSION;  Surgeon: Sanda Klein, MD;  Location: [place];  Service: Cardiovascular;  Laterality: N/A;  . CATARACT EXTRACTION  05/2010   left eye  . CATARACT EXTRACTION  04/2010   right eye  . CORONARY ARTERY BYPASS GRAFT     stent  . ESOPHAGOGASTRODUODENOSCOPY  03-25-2005  . LEFT HEART CATH AND CORS/GRAFTS ANGIOGRAPHY N/A 02/19/2018   Procedure: LEFT HEART CATH AND CORS/GRAFTS ANGIOGRAPHY;  Surgeon: Martinique, Peter M, MD;  Location: [place] CV LAB;  Service: Cardiovascular;  Laterality: N/A;  . NASAL SEPTUM SURGERY    . PERCUTANEOUS PLACEMENT INTRAVASCULAR STENT CERVICAL CAROTID ARTERY     03-2009; using a drug-eluting platform of the circumflex cornoray artery with a 3.0 x 18 [employer] Promus drug-eluting platform post dilated to 3.75 with a noncompliant balloon.  . TEE WITHOUT CARDIOVERSION  06/20/2011   Procedure: TRANSESOPHAGEAL ECHOCARDIOGRAM (TEE);  Surgeon: Larey Dresser, MD;  Location: [facility] ENDOSCOPY;  Service: Cardiovascular;  Laterality: N/A;    Current Outpatient Medications  Medication Sig Dispense Refill  . Accu-Chek FastClix Lancets MISC USE TO TEST 3 TIMES DAILY 102 each 0  . acetaminophen (TYLENOL) 325 MG tablet Take 2 tablets (650 mg total) by mouth every 4 (four) hours as needed for headache or mild pain.    Marland Kitchen allopurinol (ZYLOPRIM) 100 MG tablet TAKE 2 TABLETS BY MOUTH EVERY DAY 180 tablet 1  . apixaban (ELIQUIS) 5 MG TABS tablet Take 1 tablet (5 mg total) by mouth 2 (two) times daily. 60 tablet 11  . atorvastatin  (LIPITOR) 40 MG tablet Take 1 tablet (40 mg total) by mouth daily. 90 tablet 3  . benazepril (LOTENSIN) 10 MG tablet Take 1 tablet (10 mg total) by mouth daily. (Patient taking differently: Take 15 mg by mouth daily. ) 90 tablet 1  . benazepril (LOTENSIN) 5 MG tablet TAKE 1 TABLET BY MOUTH DAILY WITH 10MG OF BENAZEPRIL TO EQUAL 15MG DAILY 90 tablet 1  . blood glucose meter kit and supplies KIT Dispense based on patient and insurance preference. Use up to four times daily as directed. (FOR ICD-9 250.00, 250.01). 102 each 0  . cyanocobalamin 1000 MCG tablet Take 2,000 mcg by mouth daily. Take 2 Gummies daily    . diclofenac Sodium (VOLTAREN) 1 % GEL     . diltiazem (CARDIZEM CD) 180 MG 24 hr capsule Take 1 capsule (180 mg total) by mouth daily. 30 capsule 6  . dofetilide (TIKOSYN) 500 MCG capsule Take 1 capsule (500 mcg total) by mouth 2 (two) times daily. 60 capsule 6  . ferrous sulfate 325 (65 FE) MG tablet Take 1 tablet (325 mg total) by mouth  2 (two) times daily with a meal. (Patient taking differently: Take 650 mg by mouth every evening. ) 180 tablet 1  . fluticasone (FLONASE) 50 MCG/ACT nasal spray Place 2 sprays into both nostrils as needed for allergies or rhinitis. 16 g 0  . folic acid (FOLVITE) 1 MG tablet Take 1 tablet (1 mg total) by mouth daily. 90 tablet 1  . furosemide (LASIX) 40 MG tablet TAKE 1 TABLET BY MOUTH DAILY AS NEEDED FOR EDEMA (EDEMA AND WEIGHT GAIN). 90 tablet 1  . gabapentin (NEURONTIN) 100 MG capsule Take 1 capsule (100 mg total) by mouth at bedtime. 90 capsule 1  . glucose blood (ACCU-CHEK GUIDE) test strip USE AS DIRECTED UP TO 4 TIMES DAILY 100 strip 0  . levothyroxine (SYNTHROID) 112 MCG tablet Take 1 tablet (112 mcg total) by mouth daily before breakfast. 90 tablet 1  . metFORMIN (GLUCOPHAGE) 500 MG tablet Take 1 tablet (500 mg total) by mouth daily with breakfast. 90 tablet 1  . metoprolol succinate (TOPROL XL) 25 MG 24 hr tablet Take 1 tablet (25 mg total) by mouth  daily. 90 tablet 3  . Multiple Vitamins-Minerals (MULTIVITAMIN GUMMIES MENS PO) Take 2 tablets by mouth daily with breakfast.     . nitroGLYCERIN (NITROSTAT) 0.4 MG SL tablet Place 1 tablet (0.4 mg total) under the tongue every 5 (five) minutes as needed for chest pain. Up to 3 doses 10 tablet 2  . OXYGEN Inhale 2 L into the lungs at bedtime.    . pantoprazole (PROTONIX) 40 MG tablet Take 1 tablet (40 mg total) by mouth every morning. 90 tablet 1  . sennosides-docusate sodium (SENOKOT-S) 8.6-50 MG tablet Take 1 tablet by mouth as needed for constipation.    . traMADol (ULTRAM) 50 MG tablet Take 1 tablet (50 mg total) by mouth 2 (two) times daily. 180 tablet 1  . traMADol (ULTRAM-ER) 200 MG 24 hr tablet Take 1 tablet (200 mg total) by mouth daily. 90 tablet 1   No current facility-administered medications for this encounter.    No Known Allergies  Social History   Socioeconomic History  . Marital status: Married    Spouse name: [name]  . Number of children: Not on file  . Years of education: 16  . Highest education level: Not on file  Occupational History  . 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 Smoking status: Never Smoker  . Smokeless tobacco: Never Used  Substance and Sexual Activity  . Alcohol use: No    Alcohol/week: 0.0 standard drinks    Comment: 1 beer a month  . Drug use: No  . Sexual activity: Not Currently  Other Topics Concern  . Not on file  Social History Narrative   Pt lives with wife. Does have stairs, but patient doesn't use them. Pt has completed technical school   Social Determinants of Health   Financial Resource Strain:   . Difficulty of Paying Living Expenses:   Food Insecurity:   . Worried About Charity fundraiser in the Last Year:   . Arboriculturist in the Last Year:   Transportation Needs:   . Film/video editor (Medical):   Marland Kitchen Lack of Transportation (Non-Medical):   Physical Activity:   .  Days of Exercise per Week:   . Minutes of Exercise per Session:   Stress:   . Feeling of Stress :   Social Connections:   . Frequency  of Communication with Friends and Family:   . Frequency of Social Gatherings with Friends and Family:   . Attends Religious Services:   . Active Member of Clubs or Organizations:   . Attends Archivist Meetings:   Marland Kitchen Marital Status:   Intimate Partner Violence:   . Fear of Current or Ex-Partner:   . Emotionally Abused:   Marland Kitchen Physically Abused:   . Sexually Abused:      ROS- All systems are reviewed and negative except as per the HPI above.  Physical Exam: Vitals:   08/12/19 1029  BP: 100/60  Pulse: (!) 57  Weight: 109.5 kg  Height: 6' 3" (1.905 m)    GEN- The patient is well appearing obese elderly male, alert and oriented x 3 today.   HEENT-head normocephalic, atraumatic, sclera clear, conjunctiva pink, hearing intact, trachea midline. Lungs- Clear to ausculation bilaterally, normal work of breathing Heart- Regular rate and rhythm, no murmurs, rubs or gallops  GI- soft, NT, ND, + BS Extremities- no clubbing, cyanosis, or edema MS- no significant deformity or atrophy Skin- no rash or lesion Psych- euthymic mood, full affect Neuro- strength and sensation are intact   Wt Readings from Last 3 Encounters:  08/12/19 109.5 kg  08/05/19 107.5 kg  08/02/19 111.9 kg    EKG today demonstrates SB HR 57, LAFB, PR 204, QRS 96, QTc 476  Echo 02/18/18 demonstrated  - Left ventricle: The cavity size was normal. There was mild focal  basal hypertrophy of the septum. Systolic function was normal.  The estimated ejection fraction was in the range of 55% to 60%.  Wall motion was normal; there were no regional wall motion  abnormalities. Doppler parameters are consistent with abnormal  left ventricular relaxation (grade 1 diastolic dysfunction).  Doppler parameters are consistent with elevated ventricular  end-diastolic filling  pressure.  - Mitral valve: Calcified annulus. Mildly thickened leaflets .  There was mild regurgitation.  - Left atrium: The atrium was mildly dilated.  - Right ventricle: The cavity size was mildly dilated. Wall  thickness was normal. Systolic function was mildly reduced.  - Right atrium: The atrium was normal in size.  - Tricuspid valve: There was mild regurgitation.  - Pulmonary arteries: Systolic pressure was within the normal  range.  - Inferior vena cava: The vessel was normal in size.  - Pericardium, extracardiac: There was no pericardial effusion.   Epic records are reviewed at length today  CHA2DS2-VASc Score = 6  The patient's score is based upon: CHF History: 0 HTN History: 1 Age : 2 Diabetes History: 0 Stroke History: 2 Vascular Disease History: 1 Gender: 0      ASSESSMENT AND PLAN: 1. Longstanding Persistent Atrial Fibrillation (ICD10:  I48.11) The patient's CHA2DS2-VASc score is 6, indicating a 9.7% annual risk of stroke.   S/p dofetilide loading 5/18-5/21/21 Patient appears to be maintaining SR. Continue dofetilide 500 mcg BID. QT stable. Check Bmet/mag today. Continue Eliquis 5 mg BID  Continue diltiazem 180 mg BID Continue Toprol 25 mg daily  2. Secondary Hypercoagulable State (ICD10:  D68.69) The patient is at significant risk for stroke/thromboembolism based upon his CHA2DS2-VASc Score of 6.  Continue Apixaban (Eliquis).   3. Obesity Body mass index is 30.17 kg/m.  4. Obstructive sleep apnea Patient diagnosed remotely and did not tolerate CPAP therapy.  5. CAD S/p CABG No anginal symptoms.   Follow up with Oda Kilts as scheduled. AF clinic in 4 months.    [name] Yatziry Deakins PA-C Af[facility]  [facility] [address] [place], [place] [address] [phone] 08/12/2019 10:56 AM

## 2019-08-29 ENCOUNTER — Other Ambulatory Visit: Payer: Self-pay | Admitting: Orthopaedic Surgery

## 2019-08-29 NOTE — Telephone Encounter (Signed)
Xu patient

## 2019-09-08 NOTE — Progress Notes (Deleted)
PCP:  Debbrah Alar, NP Primary Cardiologist: Candee Furbish, MD Electrophysiologist: Dr. Arlyss Repress is a 74 y.o. male seen today for Virl Axe, MD for routine electrophysiology followup.  Since discharge from hospital for tikosyn load the patient reports doing ***.  he denies chest pain, palpitations, dyspnea, PND, orthopnea, nausea, vomiting, dizziness, syncope, edema, weight gain, or early satiety.  Past Medical History:  Diagnosis Date  . Acute encephalopathy 12/12/2015  . Acute respiratory failure with hypoxia and hypercapnia ([place]) 12/12/2015  . Allergic rhinitis 02/13/2010   Qualifier: Diagnosis of  By: Wynona Luna   . Angina at rest [facility]) 10/22/2015  . Ataxia 08/10/2013  . Atrial fibrillation ([place]) 11/2018  . B12 deficiency 08/11/2013  . BACK PAIN, LUMBAR 01/29/2009   Qualifier: Diagnosis of  By: Wynona Luna   . BENIGN POSITIONAL VERTIGO 11/23/2009   Qualifier: Diagnosis of  By: Wynona Luna   . Chronic diastolic heart failure ([place]) 04/24/2015  . Chronic pain    left sided-Kristeins  . Coronary 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 ([place]) 11/04/2015   s/p CABG 2004; CFX DES 2011  . Essential hypertension 12/12/2006   Qualifier: Diagnosis of  By: [name] RMA, Lucy    . GERD 12/12/2006   Qualifier: Diagnosis of  By: [name] RMA, Lucy    . GOUT 12/12/2006   Qualifier: Diagnosis of  By: Reatha Armour, Lucy    . Hyperlipidemia LDL goal <70 12/12/2006   Qualifier: Diagnosis of  By: [name] RMA, Lucy     . Hypogonadism male 04/10/2011  . Hypothyroidism, acquired 02/15/2013  . Insomnia 05/21/2013  . Long term current use of anticoagulant therapy 06/24/2011  . Obstructive sleep apnea-Failed CPAP 06/17/2007   Failed CPAP Using O 2 for sleep   . Paroxysmal atrial fibrillation ([place]) 12/06/2016  . 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 Thoracic aorta atherosclerosis ([place]) 04/27/2017  . Tubular adenoma of  colon 05/2010  . Type 2 diabetes mellitus without complication, without long-term current use of insulin ([place]) 10/05/2007   Qualifier: Diagnosis of  By: Wynona Luna   . Vasovagal syncope    Past Surgical History:  Procedure Laterality Date  . CARDIAC CATHETERIZATION N/A 10/25/2015   Procedure: Left Heart Cath and Cors/Grafts Angiography;  Surgeon: Troy Sine, MD;  Location: [facility] [facility];  Service: Cardiovascular;  Laterality: N/A;  . CARDIOVERSION  06/20/2011   Procedure: CARDIOVERSION;  Surgeon: Larey Dresser, MD;  Location: [facility] ENDOSCOPY;  Service: Cardiovascular;  Laterality: N/A;  . CARDIOVERSION N/A 04/26/2015   Procedure: CARDIOVERSION;  Surgeon: Dorothy Spark, MD;  Location: [facility]COPY;  Service: Cardiovascular;  Laterality: N/A;  . CARDIOVERSION N/A 05/10/2015   Procedure: CARDIOVERSION;  Surgeon: Larey Dresser, MD;  Location: [place];  Service: Cardiovascular;  Laterality: N/A;  . CARDIOVERSION N/A 12/10/2018   Procedure: CARDIOVERSION;  Surgeon: Sanda Klein, MD;  Location: [place];  Service: Cardiovascular;  Laterality: N/A;  . CATARACT EXTRACTION  05/2010   left eye  . CATARACT EXTRACTION  04/2010   right eye  . CORONARY ARTERY BYPASS GRAFT     stent  . ESOPHAGOGASTRODUODENOSCOPY  03-25-2005  . LEFT HEART CATH AND CORS/GRAFTS ANGIOGRAPHY N/A 02/19/2018   Procedure: LEFT HEART CATH AND CORS/GRAFTS ANGIOGRAPHY;  Surgeon: Martinique, Peter M, MD;  Location: [place] CV LAB;  Service: Cardiovascular;  Laterality: N/A;  . NASAL SEPTUM  SURGERY    . PERCUTANEOUS PLACEMENT INTRAVASCULAR STENT CERVICAL CAROTID ARTERY     03-2009; using a drug-eluting platform of the circumflex cornoray artery with a 3.0 x 18 [employer] Promus drug-eluting platform post dilated to 3.75 with a noncompliant balloon.  . TEE WITHOUT CARDIOVERSION  06/20/2011   Procedure: TRANSESOPHAGEAL ECHOCARDIOGRAM (TEE);  Surgeon: Larey Dresser, MD;  Location: [facility] ENDOSCOPY;  Service:  Cardiovascular;  Laterality: N/A;    Current Outpatient Medications  Medication Sig Dispense Refill  . Accu-Chek FastClix Lancets MISC USE TO TEST 3 TIMES DAILY 102 each 0  . acetaminophen (TYLENOL) 325 MG tablet Take 2 tablets (650 mg total) by mouth every 4 (four) hours as needed for headache or mild pain.    Marland Kitchen allopurinol (ZYLOPRIM) 100 MG tablet TAKE 2 TABLETS BY MOUTH EVERY DAY 180 tablet 1  . apixaban (ELIQUIS) 5 MG TABS tablet Take 1 tablet (5 mg total) by mouth 2 (two) times daily. 60 tablet 11  . atorvastatin (LIPITOR) 40 MG tablet Take 1 tablet (40 mg total) by mouth daily. 90 tablet 3  . benazepril (LOTENSIN) 10 MG tablet Take 1 tablet (10 mg total) by mouth daily. (Patient taking differently: Take 15 mg by mouth daily. ) 90 tablet 1  . benazepril (LOTENSIN) 5 MG tablet TAKE 1 TABLET BY MOUTH DAILY WITH 10MG OF BENAZEPRIL TO EQUAL 15MG DAILY 90 tablet 1  . blood glucose meter kit and supplies KIT Dispense based on patient and insurance preference. Use up to four times daily as directed. (FOR ICD-9 250.00, 250.01). 102 each 0  . cyanocobalamin 1000 MCG tablet Take 2,000 mcg by mouth daily. Take 2 Gummies daily    . diclofenac Sodium (VOLTAREN) 1 % GEL USE AS DIRECTED 100 g 6  . diltiazem (CARDIZEM CD) 180 MG 24 hr capsule Take 1 capsule (180 mg total) by mouth daily. 30 capsule 6  . dofetilide (TIKOSYN) 500 MCG capsule Take 1 capsule (500 mcg total) by mouth 2 (two) times daily. 60 capsule 6  . ferrous sulfate 325 (65 FE) MG tablet Take 1 tablet (325 mg total) by mouth 2 (two) times daily with a meal. (Patient taking differently: Take 650 mg by mouth every evening. ) 180 tablet 1  . fluticasone (FLONASE) 50 MCG/ACT nasal spray Place 2 sprays into both nostrils as needed for allergies or rhinitis. 16 g 0  . folic acid (FOLVITE) 1 MG tablet Take 1 tablet (1 mg total) by mouth daily. 90 tablet 1  . furosemide (LASIX) 40 MG tablet TAKE 1 TABLET BY MOUTH DAILY AS NEEDED FOR EDEMA (EDEMA AND  WEIGHT GAIN). 90 tablet 1  . gabapentin (NEURONTIN) 100 MG capsule Take 1 capsule (100 mg total) by mouth at bedtime. 90 capsule 1  . glucose blood (ACCU-CHEK GUIDE) test strip USE AS DIRECTED UP TO 4 TIMES DAILY 100 strip 0  . levothyroxine (SYNTHROID) 112 MCG tablet Take 1 tablet (112 mcg total) by mouth daily before breakfast. 90 tablet 1  . metFORMIN (GLUCOPHAGE) 500 MG tablet Take 1 tablet (500 mg total) by mouth daily with breakfast. 90 tablet 1  . metoprolol succinate (TOPROL XL) 25 MG 24 hr tablet Take 1 tablet (25 mg total) by mouth daily. 90 tablet 3  . Multiple Vitamins-Minerals (MULTIVITAMIN GUMMIES MENS PO) Take 2 tablets by mouth daily with breakfast.     . nitroGLYCERIN (NITROSTAT) 0.4 MG SL tablet Place 1 tablet (0.4 mg total) under the tongue every 5 (five) minutes as needed for  chest pain. Up to 3 doses 10 tablet 2  . OXYGEN Inhale 2 L into the lungs at bedtime.    . pantoprazole (PROTONIX) 40 MG tablet Take 1 tablet (40 mg total) by mouth every morning. 90 tablet 1  . sennosides-docusate sodium (SENOKOT-S) 8.6-50 MG tablet Take 1 tablet by mouth as needed for constipation.    . traMADol (ULTRAM) 50 MG tablet Take 1 tablet (50 mg total) by mouth 2 (two) times daily. 180 tablet 1  . traMADol (ULTRAM-ER) 200 MG 24 hr tablet Take 1 tablet (200 mg total) by mouth daily. 90 tablet 1   No current facility-administered medications for this visit.    No Known Allergies  Social History   Socioeconomic History  . Marital status: Married    Spouse name: [name]  . Number of children: Not on file  . Years of education: 72  . Highest education level: Not on file  Occupational History  . 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 Smoking status: Never Smoker  . Smokeless tobacco: Never Used  Vaping Use  . Vaping Use: Never used  Substance and Sexual Activity  . Alcohol use: No    Alcohol/week: 0.0 standard drinks    Comment: 1 beer a  month  . Drug use: No  . Sexual activity: Not Currently  Other Topics Concern  . Not on file  Social History Narrative   Pt lives with wife. Does have stairs, but patient doesn't use them. Pt has completed technical school   Social Determinants of Health   Financial Resource Strain:   . Difficulty of Paying Living Expenses:   Food Insecurity:   . Worried About Charity fundraiser in the Last Year:   . Arboriculturist in the Last Year:   Transportation Needs:   . Film/video editor (Medical):   Marland Kitchen Lack of Transportation (Non-Medical):   Physical Activity:   . Days of Exercise per Week:   . Minutes of Exercise per Session:   Stress:   . Feeling of Stress :   Social Connections:   . Frequency of Communication with Friends and Family:   . Frequency of Social Gatherings with Friends and Family:   . Attends Religious Services:   . Active Member of Clubs or Organizations:   . Attends Archivist Meetings:   Marland Kitchen Marital Status:   Intimate Partner Violence:   . Fear of Current or Ex-Partner:   . Emotionally Abused:   Marland Kitchen Physically Abused:   . Sexually Abused:      Review of Systems: General: No chills, fever, night sweats or weight changes  Cardiovascular:  No chest pain, dyspnea on exertion, edema, orthopnea, palpitations, paroxysmal nocturnal dyspnea Dermatological: No rash, lesions or masses Respiratory: No cough, dyspnea Urologic: No hematuria, dysuria Abdominal: No nausea, vomiting, diarrhea, bright red blood per rectum, melena, or hematemesis Neurologic: No visual changes, weakness, changes in mental status All other systems reviewed and are otherwise negative except as noted above.  Physical Exam: There were no vitals filed for this visit.  GEN- The patient is well appearing, alert and oriented x 3 today.   HEENT: normocephalic, atraumatic; sclera clear, conjunctiva pink; hearing intact; oropharynx clear; neck supple, no JVP Lymph- no cervical  lymphadenopathy Lungs- Clear to ausculation bilaterally, normal work of breathing.  No wheezes, rales, rhonchi Heart- Regular rate and rhythm, no murmurs, rubs or gallops, PMI not laterally displaced  GI- soft, non-tender, non-distended, bowel sounds present, no hepatosplenomegaly Extremities- no clubbing, cyanosis, or edema; DP/PT/radial pulses 2+ bilaterally MS- no significant deformity or atrophy Skin- warm and dry, no rash or lesion Psych- euthymic mood, full affect Neuro- strength and sensation are intact  EKG {ACTION; IS/IS BIP:[id number]} ordered. Personal review of EKG from {Blank single:19197::"today","***"} shows ***  Additional studies reviewed include: ***  Assessment and Plan:  1. Persistent atrial fibrillation Continue eliquis for CHA2DS2VASC of at least 7   Continue tikosyn 500 mcg BID  2. Sinus bradycardia Stable  3. CAD Denies ischemic symptoms  4. Chronic diastolic CHF Volume status stable on exam.  Shirley Friar, PA-C  09/08/19 10:56 AM

## 2019-09-12 ENCOUNTER — Ambulatory Visit: Payer: Medicare Other | Admitting: Student

## 2019-09-13 ENCOUNTER — Other Ambulatory Visit: Payer: Self-pay | Admitting: Physical Medicine & Rehabilitation

## 2019-09-23 ENCOUNTER — Other Ambulatory Visit: Payer: Self-pay

## 2019-09-23 ENCOUNTER — Encounter: Payer: Self-pay | Admitting: Physical Medicine & Rehabilitation

## 2019-09-23 ENCOUNTER — Encounter: Payer: Medicare Other | Attending: Physical Medicine & Rehabilitation | Admitting: Physical Medicine & Rehabilitation

## 2019-09-23 VITALS — BP 110/71 | HR 66 | Temp 98.1°F | Ht 75.0 in | Wt 241.8 lb

## 2019-09-23 DIAGNOSIS — G8114 Spastic hemiplegia affecting left nondominant side: Secondary | ICD-10-CM | POA: Diagnosis not present

## 2019-09-23 DIAGNOSIS — G8110 Spastic hemiplegia affecting unspecified side: Secondary | ICD-10-CM | POA: Diagnosis not present

## 2019-09-23 MED ORDER — TRAMADOL HCL 50 MG PO TABS: 50.0000 mg | ORAL_TABLET | Freq: Two times a day (BID) | ORAL | 0 refills | Status: DC

## 2019-09-23 MED ORDER — TRAMADOL HCL ER 200 MG PO TB24: 200.0000 mg | ORAL_TABLET | Freq: Every day | ORAL | 0 refills | Status: DC

## 2019-09-23 NOTE — Patient Instructions (Signed)

## 2019-09-23 NOTE — Progress Notes (Signed)
Botox Injection for spasticity using needle EMG guidance °  °Dilution: 50 Units/ml °Indication: Severe spasticity which interferes with ADL,mobility and/or  hygiene and is unresponsive to medication management and other conservative care °Informed consent was obtained after describing risks and benefits of the procedure with the patient. This includes bleeding, bruising, infection, excessive weakness, or medication side effects. A REMS form is on file and signed. °Needle: 27g 1" needle electrode °Number of units per muscle °  ° left FDP 25 units ° left FDS 25 units °left biceps 50 units °[id number] °All injections were done after obtaining appropriate EMG activity and after negative drawback for blood. The patient tolerated the procedure well. Post procedure instructions were given. A followup appointment was made.     °

## 2019-09-28 ENCOUNTER — Ambulatory Visit: Payer: Medicare Other | Admitting: Family

## 2019-09-28 DIAGNOSIS — Z0289 Encounter for other administrative examinations: Secondary | ICD-10-CM

## 2019-10-02 NOTE — Progress Notes (Signed)
Cardiology Office Note Date:  10/04/2019  Patient ID:  [name], DOB [birth year], MRN [id number] PCP:  Debbrah Alar, NP  Cardiologist:  Dr. Marlou Porch EP-Dr. Caryl Comes     Chief Complaint: over due 80mopost tikosyn visit   History of Present Illness: [name] a 74y.o. 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  [facility] 08[facility].  He did his 1 week visit with the AFib clinic, reported feeling better , maintaining SR, QT stable., lytes/labs as well.   He is doing very well, has not had any AFib, and feels so much better in normal rhythm.  He has been painting the stairs that lead from his garage to the kitchen and feels good being as active as he can be given his physical limitations post stroke, also mentions his arthritis is bothersome. No CP,m palpitations or cardiac awareness He weighs daily and reports his weigh about 163+/- a couple pounds here and there No rest SOB, no DOE, no symptoms of PND or orthopnea He uses 2L n/c at HS No dizziness, near syncope or syncope  No bleeding or signs of bleeding  He comes in using a walker, but reports walks well independently at home, uses the walker when out of the house or knows he will have longer walking to do  He is likely to need hemorrhoid surgery in the near future, no bleeding but is bothersome  Past Medical History:  Diagnosis Date  . Acute encephalopathy 12/12/2015  . Acute respiratory failure with hypoxia and hypercapnia ([place] 12/12/2015  . Allergic rhinitis 02/13/2010   Qualifier: Diagnosis of  By: YWynona Luna  . Angina at rest ([facility] 10/22/2015  . Ataxia 08/10/2013  . Atrial fibrillation ([place] 11/2018  . B12 deficiency 08/11/2013  . BACK PAIN, LUMBAR  01/29/2009   Qualifier: Diagnosis of  By: YWynona Luna  . BENIGN POSITIONAL VERTIGO 11/23/2009   Qualifier: Diagnosis of  By: YWynona Luna  . Chronic diastolic heart failure ([place] 04/24/2015  . Chronic pain    left sided-Kristeins  . Coronary 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 ([place] 11/04/2015   s/p CABG 2004; CFX DES 2011  . Essential hypertension 12/12/2006   Qualifier: Diagnosis of  By: BMarca AnconaRMA, Lucy    . GERD 12/12/2006   Qualifier: Diagnosis of  By: BMarca AnconaRMA, Lucy    . GOUT 12/12/2006   Qualifier: Diagnosis of  By: BReatha Armour Lucy    . Hyperlipidemia LDL goal <70 12/12/2006   Qualifier: Diagnosis of  By: BMarca AnconaRMA, Lucy     . Hypogonadism male 04/10/2011  . Hypothyroidism, acquired 02/15/2013  . Insomnia 05/21/2013  . Long term current use of anticoagulant therapy 06/24/2011  . Obstructive sleep apnea-Failed CPAP 06/17/2007   Failed CPAP Using O 2 for sleep   . Paroxysmal atrial fibrillation ([place] 12/06/2016  . Stroke (cerebrum) ([place] 01/13/2017   also 1996; resultant hemiplegia of dominant side  . Thoracic aorta atherosclerosis ([place] 04/27/2017  . Tubular adenoma of colon 05/2010  . Type 2 diabetes mellitus without complication, without long-term current use of insulin ([place] 10/05/2007   Qualifier: Diagnosis of  By: YWynona Luna  . Vasovagal syncope     Past  Surgical History:  Procedure Laterality Date  . CARDIAC CATHETERIZATION N/A 10/25/2015   Procedure: Left Heart Cath and Cors/Grafts Angiography;  Surgeon: Troy Sine, MD;  Location: [place] CV LAB;  Service: Cardiovascular;  Laterality: N/A;  . CARDIOVERSION  06/20/2011   Procedure: CARDIOVERSION;  Surgeon: Larey Dresser, MD;  Location: [facility] ENDOSCOPY;  Service: Cardiovascular;  Laterality: N/A;  . CARDIOVERSION N/A 04/26/2015   Procedure: CARDIOVERSION;  Surgeon: Dorothy Spark, MD;  Location: [facility] ENDOSCOPY;  Service: Cardiovascular;  Laterality: N/A;  . CARDIOVERSION N/A 05/10/2015    Procedure: CARDIOVERSION;  Surgeon: Larey Dresser, MD;  Location: [place];  Service: Cardiovascular;  Laterality: N/A;  . CARDIOVERSION N/A 12/10/2018   Procedure: CARDIOVERSION;  Surgeon: Sanda Klein, MD;  Location: [place];  Service: Cardiovascular;  Laterality: N/A;  . CATARACT EXTRACTION  05/2010   left eye  . CATARACT EXTRACTION  04/2010   right eye  . CORONARY ARTERY BYPASS GRAFT     stent  . ESOPHAGOGASTRODUODENOSCOPY  03-25-2005  . LEFT HEART CATH AND CORS/GRAFTS ANGIOGRAPHY N/A 02/19/2018   Procedure: LEFT HEART CATH AND CORS/GRAFTS ANGIOGRAPHY;  Surgeon: Martinique, Peter M, MD;  Location: [place] CV LAB;  Service: Cardiovascular;  Laterality: N/A;  . NASAL SEPTUM SURGERY    . PERCUTANEOUS PLACEMENT INTRAVASCULAR STENT CERVICAL CAROTID ARTERY     03-2009; using a drug-eluting platform of the circumflex cornoray artery with a 3.0 x 18 [employer] Promus drug-eluting platform post dilated to 3.75 with a noncompliant balloon.  . TEE WITHOUT CARDIOVERSION  06/20/2011   Procedure: TRANSESOPHAGEAL ECHOCARDIOGRAM (TEE);  Surgeon: Larey Dresser, MD;  Location: [facility] ENDOSCOPY;  Service: Cardiovascular;  Laterality: N/A;    Current Outpatient Medications  Medication Sig Dispense Refill  . Accu-Chek FastClix Lancets MISC USE TO TEST 3 TIMES DAILY 102 each 0  . acetaminophen (TYLENOL) 325 MG tablet Take 2 tablets (650 mg total) by mouth every 4 (four) hours as needed for headache or mild pain.    Marland Kitchen allopurinol (ZYLOPRIM) 100 MG tablet TAKE 2 TABLETS BY MOUTH EVERY DAY 180 tablet 1  . apixaban (ELIQUIS) 5 MG TABS tablet Take 1 tablet (5 mg total) by mouth 2 (two) times daily. 60 tablet 11  . atorvastatin (LIPITOR) 40 MG tablet Take 1 tablet (40 mg total) by mouth daily. 90 tablet 3  . benazepril (LOTENSIN) 10 MG tablet Take 1 tablet (10 mg total) by mouth daily. (Patient taking differently: Take 15 mg by mouth daily. ) 90 tablet 1  . benazepril (LOTENSIN) 5 MG tablet TAKE 1  TABLET BY MOUTH DAILY WITH 10MG OF BENAZEPRIL TO EQUAL 15MG DAILY 90 tablet 1  . blood glucose meter kit and supplies KIT Dispense based on patient and insurance preference. Use up to four times daily as directed. (FOR ICD-9 250.00, 250.01). 102 each 0  . cyanocobalamin 1000 MCG tablet Take 2,000 mcg by mouth daily. Take 2 Gummies daily    . diclofenac Sodium (VOLTAREN) 1 % GEL USE AS DIRECTED 100 g 6  . diltiazem (CARDIZEM CD) 180 MG 24 hr capsule Take 1 capsule (180 mg total) by mouth daily. 30 capsule 6  . dofetilide (TIKOSYN) 500 MCG capsule Take 1 capsule (500 mcg total) by mouth 2 (two) times daily. 60 capsule 6  . ferrous sulfate 325 (65 FE) MG tablet Take 1 tablet (325 mg total) by mouth 2 (two) times daily with a meal. (Patient taking differently: Take 650 mg by mouth every evening. ) 180 tablet  1  . fluticasone (FLONASE) 50 MCG/ACT nasal spray Place 2 sprays into both nostrils as needed for allergies or rhinitis. 16 g 0  . folic acid (FOLVITE) 1 MG tablet Take 1 tablet (1 mg total) by mouth daily. 90 tablet 1  . furosemide (LASIX) 40 MG tablet TAKE 1 TABLET BY MOUTH DAILY AS NEEDED FOR EDEMA (EDEMA AND WEIGHT GAIN). 90 tablet 1  . gabapentin (NEURONTIN) 100 MG capsule Take 1 capsule (100 mg total) by mouth at bedtime. 90 capsule 1  . glucose blood (ACCU-CHEK GUIDE) test strip USE AS DIRECTED UP TO 4 TIMES DAILY 100 strip 0  . levothyroxine (SYNTHROID) 112 MCG tablet Take 1 tablet (112 mcg total) by mouth daily before breakfast. 90 tablet 1  . metFORMIN (GLUCOPHAGE) 500 MG tablet Take 1 tablet (500 mg total) by mouth daily with breakfast. 90 tablet 1  . metoprolol succinate (TOPROL XL) 25 MG 24 hr tablet Take 1 tablet (25 mg total) by mouth daily. 90 tablet 3  . Multiple Vitamins-Minerals (MULTIVITAMIN GUMMIES MENS PO) Take 2 tablets by mouth daily with breakfast.     . nitroGLYCERIN (NITROSTAT) 0.4 MG SL tablet Place 1 tablet (0.4 mg total) under the tongue every 5 (five) minutes as needed  for chest pain. Up to 3 doses 10 tablet 2  . OXYGEN Inhale 2 L into the lungs at bedtime.    . pantoprazole (PROTONIX) 40 MG tablet Take 1 tablet (40 mg total) by mouth every morning. 90 tablet 1  . sennosides-docusate sodium (SENOKOT-S) 8.6-50 MG tablet Take 1 tablet by mouth as needed for constipation.    . traMADol (ULTRAM) 50 MG tablet Take 1 tablet (50 mg total) by mouth 2 (two) times daily. 180 tablet 0  . traMADol (ULTRAM-ER) 200 MG 24 hr tablet Take 1 tablet (200 mg total) by mouth daily. 90 tablet 0   No current facility-administered medications for this visit.    Allergies:   Patient has no allergy information on record.   Social History:  The patient  reports that he has never smoked. He has never used smokeless tobacco. He reports that he does not drink alcohol and does not use drugs.   Family History:  The patient's family history includes Lung cancer in his father; Stroke in his mother.  ROS:  Please see the history of present illness.  All other systems are reviewed and otherwise negative.   PHYSICAL EXAM:  VS:  BP 124/66   Pulse 68   Ht 6' 3"  (1.905 m)   Wt 223 lb (101.2 kg)   BMI 27.87 kg/m  BMI: Body mass index is 27.87 kg/m. Well nourished, well developed, in no acute distress  HEENT: normocephalic, atraumatic  Neck: no JVD, carotid bruits or masses Cardiac:  RRR; no significant murmurs, no rubs, or gallops Lungs:  CTA b/l, no wheezing, rhonchi or rales  Abd: soft, nontender MS: no deformity, age appropriate atrophy Ext: no edema  Skin: warm and dry, no rash Neuro:  No gross deficits appreciated Psych: euthymic mood, full affect   EKG:  Done today and reviewed by myself shows  SR 68bpm, intervals are OK, QTc stable 44m/469ms  Echo12/5/19demonstrated  - Left ventricle: The cavity size was normal. There was mild focal  basal hypertrophy of the septum. Systolic function was normal.  The estimated ejection fraction was in the range of 55% to 60%.    Wall motion was normal; there were no regional wall motion  abnormalities. Doppler parameters are consistent  with abnormal  left ventricular relaxation (grade 1 diastolic dysfunction).  Doppler parameters are consistent with elevated ventricular  end-diastolic filling pressure.  - Mitral valve: Calcified annulus. Mildly thickened leaflets .  There was mild regurgitation.  - Left atrium: The atrium was mildly dilated.  - Right ventricle: The cavity size was mildly dilated. Wall  thickness was normal. Systolic function was mildly reduced.  - Right atrium: The atrium was normal in size.  - Tricuspid valve: There was mild regurgitation.  - Pulmonary arteries: Systolic pressure was within the normal  range.  - Inferior vena cava: The vessel was normal in size.  - Pericardium, extracardiac: There was no pericardial effusion.    Recent Labs: 12/17/2018: ALT 61 12/23/2018: B Natriuretic Peptide 361.3 04/14/2019: Hemoglobin 13.6; Platelets 158 05/25/2019: TSH 1.55 08/12/2019: BUN 20; Creatinine, Ser 1.18; Magnesium 2.1; Potassium 4.3; Sodium 139  12/09/2018: Cholesterol 103; HDL 43; LDL Cholesterol 49; Total CHOL/HDL Ratio 2.4; Triglycerides 54; VLDL 11   CrCl cannot be calculated (Patient's most recent lab result is older than the maximum 21 days allowed.).   Wt Readings from Last 3 Encounters:  10/04/19 223 lb (101.2 kg)  09/23/19 241 lb 12.8 oz (109.7 kg)  08/12/19 241 lb 6.4 oz (109.5 kg)     Other studies reviewed: Additional studies/records reviewed today include: summarized above  ASSESSMENT AND PLAN:  1. Persistent AFib     CHA2DS2Vasc is 7, on Eliquis, appropriately dosed     maintaining Sr with Tikosyn     QTc is OK     Labs today  Med list reviewed Re-enforced Tikosyn education   2. CAD     No symptoms     On statin, BB, no ASA with Eliquis     c/w Dr. Jane Canary  3. Chronic CHF (diastolic)     Does not appear volume OL currently     No exam  findings or symptoms of volume OL     Reports stable weights at home     On BB, ACE, diuretic tx  4. HTN     looks OK     No changes   Disposition: F/u in 6 months, he requests Dr. Caryl Comes.  Current medicines are reviewed at length with the patient today.  The patient did not have any concerns regarding medicines.  Venetia Night, PA-C 10/04/2019 10:33 AM     [place] [address] [phone] (office)  [phone] (fax)

## 2019-10-04 ENCOUNTER — Other Ambulatory Visit: Payer: Self-pay

## 2019-10-04 ENCOUNTER — Ambulatory Visit (INDEPENDENT_AMBULATORY_CARE_PROVIDER_SITE_OTHER): Payer: Medicare Other | Admitting: Physician Assistant

## 2019-10-04 VITALS — BP 124/66 | HR 68 | Ht 75.0 in | Wt 240.0 lb

## 2019-10-04 DIAGNOSIS — I5032 Chronic diastolic (congestive) heart failure: Secondary | ICD-10-CM

## 2019-10-04 DIAGNOSIS — I4819 Other persistent atrial fibrillation: Secondary | ICD-10-CM

## 2019-10-04 DIAGNOSIS — I2510 Atherosclerotic heart disease of native coronary artery without angina pectoris: Secondary | ICD-10-CM | POA: Diagnosis not present

## 2019-10-04 DIAGNOSIS — Z9861 Coronary angioplasty status: Secondary | ICD-10-CM

## 2019-10-04 DIAGNOSIS — Z5181 Encounter for therapeutic drug level monitoring: Secondary | ICD-10-CM | POA: Diagnosis not present

## 2019-10-04 DIAGNOSIS — Z79899 Other long term (current) drug therapy: Secondary | ICD-10-CM

## 2019-10-04 DIAGNOSIS — I10 Essential (primary) hypertension: Secondary | ICD-10-CM

## 2019-10-04 LAB — BASIC METABOLIC PANEL
BUN/Creatinine Ratio: 16 (ref 10–24)
BUN: 18 mg/dL (ref 8–27)
CO2: 25 mmol/L (ref 20–29)
Calcium: 9.5 mg/dL (ref 8.6–10.2)
Chloride: 100 mmol/L (ref 96–106)
Creatinine, Ser: 1.1 mg/dL (ref 0.76–1.27)
GFR calc Af Amer: 75 mL/min/{1.73_m2} (ref >59)
GFR calc non Af Amer: 65 mL/min/{1.73_m2} (ref >59)
Glucose: 105 mg/dL — ABNORMAL HIGH (ref 65–99)
Potassium: 4.5 mmol/L (ref 3.5–5.2)
Sodium: 139 mmol/L (ref 134–144)

## 2019-10-04 LAB — MAGNESIUM: Magnesium: 2.1 mg/dL (ref 1.6–2.3)

## 2019-10-04 NOTE — Patient Instructions (Signed)
Medication Instructions:   Your physician recommends that you continue on your current medications as directed. Please refer to the Current Medication list given to you today.   *If you need a refill on your cardiac medications before your next appointment, please call your pharmacy*   Lab Work: BMET AND MAG     If you have labs (blood work) drawn today and your tests are completely normal, you will receive your results only by: Marland Kitchen MyChart Message (if you have MyChart) OR . A paper copy in the mail If you have any lab test that is abnormal or we need to change your treatment, we will call you to review the results.   Testing/Procedures: NONE ORDERED  TODAY     Follow-Up: At [facility], you and your health needs are our priority.  As part of our continuing mission to provide you with exceptional heart care, we have created designated Provider Care Teams.  These Care Teams include your primary Cardiologist (physician) and Advanced Practice Providers (APPs -  Physician Assistants and Nurse Practitioners) who all work together to provide you with the care you need, when you need it.  We recommend signing up for the patient portal called "MyChart".  Sign up information is provided on this After Visit Summary.  MyChart is used to connect with patients for Virtual Visits (Telemedicine).  Patients are able to view lab/test results, encounter notes, upcoming appointments, etc.  Non-urgent messages can be sent to your provider as well.   To learn more about what you can do with MyChart, go to [email or web address].    Your next appointment:   6 month(s)  The format for your next appointment:   In Person  Provider:   You may see Virl Axe, MD  Only !!   Other Instructions

## 2019-10-11 ENCOUNTER — Other Ambulatory Visit: Payer: Self-pay | Admitting: Family

## 2019-10-28 DIAGNOSIS — L97522 Non-pressure chronic ulcer of other part of left foot with fat layer exposed: Secondary | ICD-10-CM | POA: Diagnosis not present

## 2019-10-28 DIAGNOSIS — I739 Peripheral vascular disease, unspecified: Secondary | ICD-10-CM | POA: Diagnosis not present

## 2019-10-28 DIAGNOSIS — L603 Nail dystrophy: Secondary | ICD-10-CM | POA: Diagnosis not present

## 2019-10-28 DIAGNOSIS — L84 Corns and callosities: Secondary | ICD-10-CM | POA: Diagnosis not present

## 2019-10-28 DIAGNOSIS — E1151 Type 2 diabetes mellitus with diabetic peripheral angiopathy without gangrene: Secondary | ICD-10-CM | POA: Diagnosis not present

## 2019-11-11 DIAGNOSIS — L97521 Non-pressure chronic ulcer of other part of left foot limited to breakdown of skin: Secondary | ICD-10-CM | POA: Diagnosis not present

## 2019-11-12 ENCOUNTER — Other Ambulatory Visit: Payer: Self-pay | Admitting: Family

## 2019-12-12 NOTE — Progress Notes (Signed)
Primary Care Physician: Debbrah Alar, NP Primary Cardiologist: [name] Primary Electrophysiologist: [name] [name] is a 73 y.o. 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 Patient is on Eliquis for a CHADS2VASC score of 6. He was seen by [name] on 06/28/19 and dofetilide was recommended. Patient is s/p dofetilide loading 5/18-5/21/21. He converted with the medication and did not require DCCV. He did have some episodes of bradycardia and his diltiazem was reduced.   On follow up today, patient reports he has done very well since his last visit. He states he has much more energy in SR. He is very pleased with dofetilide. He denies any bleeding issues on anticoagulation.   Today, he denies symptoms of palpitations, chest pain, shortness of breath, orthopnea, PND, lower extremity edema, dizziness, presyncope, syncope, bleeding, or neurologic sequela. The patient is tolerating medications without difficulties and is otherwise without complaint today.    Atrial Fibrillation Risk Factors:  he does have symptoms or diagnosis of sleep apnea. he is not compliant with CPAP therapy.   he has a BMI of Body mass index is 30.67 kg/m.Marland Kitchen Filed Weights   12/13/19 0857  Weight: 111.3 kg    Family History  Problem Relation Age of Onset  . Lung cancer Father        deceased  . Stroke Mother        deceased-[place]ES     Atrial Fibrillation Management history:  Previous antiarrhythmic drugs: amiodarone, dofetilide  Previous cardioversions: several, most recently 12/10/18 Previous ablations: none CHADS2VASC score: 6 Anticoagulation history: Eliquis   Past Medical History:  Diagnosis Date  . Acute encephalopathy 12/12/2015  . Acute  respiratory failure with hypoxia and hypercapnia ([place]) 12/12/2015  . Allergic rhinitis 02/13/2010   Qualifier: Diagnosis of  By: Wynona Luna   . Angina at rest [facility]) 10/22/2015  . Ataxia 08/10/2013  . Atrial fibrillation ([place]) 11/2018  . B12 deficiency 08/11/2013  . BACK PAIN, LUMBAR 01/29/2009   Qualifier: Diagnosis of  By: Wynona Luna   . BENIGN POSITIONAL VERTIGO 11/23/2009   Qualifier: Diagnosis of  By: Wynona Luna   . Chronic diastolic heart failure ([place]) 04/24/2015  . Chronic pain    left sided-[place]  . Coronary 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 ([place]) 11/04/2015   s/p CABG 2004; CFX DES 2011  . Essential hypertension 12/12/2006   Qualifier: Diagnosis of  By: [name] RMA, Lucy    . GERD 12/12/2006   Qualifier: Diagnosis of  By: [name] RMA, Lucy    . GOUT 12/12/2006   Qualifier: Diagnosis of  By: Reatha Armour, Lucy    . Hyperlipidemia LDL goal <70 12/12/2006   Qualifier: Diagnosis of  By: [name] RMA, Lucy     . Hypogonadism male 04/10/2011  . Hypothyroidism, acquired 02/15/2013  . Insomnia 05/21/2013  . Long term current use of anticoagulant therapy 06/24/2011  . Obstructive sleep apnea-Failed CPAP 06/17/2007   Failed CPAP Using O 2 for sleep   . Paroxysmal atrial fibrillation ([place]) 12/06/2016  . Stroke (cerebrum) ([place]) 01/13/2017   also 1996; resultant hemiplegia of dominant side  . Thoracic aorta atherosclerosis ([place]) 04/27/2017  . Tubular adenoma of colon 05/2010  . Type 2 diabetes mellitus without complication,  without long-term current use of insulin ([place]) 10/05/2007   Qualifier: Diagnosis of  By: Wynona Luna   . Vasovagal syncope    Past Surgical History:  Procedure Laterality Date  . CARDIAC CATHETERIZATION N/A 10/25/2015   Procedure: Left Heart Cath and Cors/Grafts Angiography;  Surgeon: Troy Sine, MD;  Location: [place] CV LAB;  Service: Cardiovascular;  Laterality: N/A;  . CARDIOVERSION  06/20/2011   Procedure: CARDIOVERSION;   Surgeon: Larey Dresser, MD;  Location: [facility] ENDOSCOPY;  Service: Cardiovascular;  Laterality: N/A;  . CARDIOVERSION N/A 04/26/2015   Procedure: CARDIOVERSION;  Surgeon: Dorothy Spark, MD;  Location: [facility] ENDOSCOPY;  Service: Cardiovascular;  Laterality: N/A;  . CARDIOVERSION N/A 05/10/2015   Procedure: CARDIOVERSION;  Surgeon: Larey Dresser, MD;  Location: [place];  Service: Cardiovascular;  Laterality: N/A;  . CARDIOVERSION N/A 12/10/2018   Procedure: CARDIOVERSION;  Surgeon: Sanda Klein, MD;  Location: [place];  Service: Cardiovascular;  Laterality: N/A;  . CATARACT EXTRACTION  05/2010   left eye  . CATARACT EXTRACTION  04/2010   right eye  . CORONARY ARTERY BYPASS GRAFT     stent  . ESOPHAGOGASTRODUODENOSCOPY  03-25-2005  . LEFT HEART CATH AND CORS/GRAFTS ANGIOGRAPHY N/A 02/19/2018   Procedure: LEFT HEART CATH AND CORS/GRAFTS ANGIOGRAPHY;  Surgeon: Martinique, Peter M, MD;  Location: [place] [facility];  Service: Cardiovascular;  Laterality: N/A;  . NASAL SEPTUM SURGERY    . PERCUTANEOUS PLACEMENT INTRAVASCULAR STENT CERVICAL CAROTID ARTERY     03-2009; using a drug-eluting platform of the circumflex cornoray artery with a 3.0 x 18 [employer] Promus drug-eluting platform post dilated to 3.75 with a noncompliant balloon.  . TEE WITHOUT CARDIOVERSION  06/20/2011   Procedure: TRANSESOPHAGEAL ECHOCARDIOGRAM (TEE);  Surgeon: Larey Dresser, MD;  Location: [facility] ENDOSCOPY;  Service: Cardiovascular;  Laterality: N/A;    Current Outpatient Medications  Medication Sig Dispense Refill  . Accu-Chek FastClix Lancets MISC USE TO TEST 3 TIMES DAILY 102 each 0  . acetaminophen (TYLENOL) 325 MG tablet Take 2 tablets (650 mg total) by mouth every 4 (four) hours as needed for headache or mild pain.    Marland Kitchen allopurinol (ZYLOPRIM) 100 MG tablet TAKE 2 TABLETS BY MOUTH EVERY DAY 180 tablet 1  . apixaban (ELIQUIS) 5 MG TABS tablet Take 1 tablet (5 mg total) by mouth 2 (two) times daily. 60 tablet 11  .  atorvastatin (LIPITOR) 40 MG tablet Take 1 tablet (40 mg total) by mouth daily. 90 tablet 3  . benazepril (LOTENSIN) 10 MG tablet Take 1 tablet (10 mg total) by mouth daily. (Patient taking differently: Take 15 mg by mouth daily. ) 90 tablet 1  . benazepril (LOTENSIN) 5 MG tablet TAKE 1 TABLET BY MOUTH DAILY WITH 10MG OF BENAZEPRIL TO EQUAL 15MG DAILY 90 tablet 1  . blood glucose meter kit and supplies KIT Dispense based on patient and insurance preference. Use up to four times daily as directed. (FOR ICD-9 250.00, 250.01). 102 each 0  . cyanocobalamin 1000 MCG tablet Take 2,000 mcg by mouth daily. Take 2 Gummies daily    . diclofenac Sodium (VOLTAREN) 1 % GEL USE AS DIRECTED 100 g 6  . diltiazem (CARDIZEM CD) 180 MG 24 hr capsule Take 1 capsule (180 mg total) by mouth daily. 30 capsule 6  . dofetilide (TIKOSYN) 500 MCG capsule Take 1 capsule (500 mcg total) by mouth 2 (two) times daily. 60 capsule 6  . ferrous sulfate 325 (65 FE) MG tablet  Take 1 tablet (325 mg total) by mouth 2 (two) times daily with a meal. (Patient taking differently: Take 650 mg by mouth every evening. ) 180 tablet 1  . fluticasone (FLONASE) 50 MCG/ACT nasal spray Place 2 sprays into both nostrils as needed for allergies or rhinitis. 16 g 0  . folic acid (FOLVITE) 1 MG tablet Take 1 tablet (1 mg total) by mouth daily. 90 tablet 1  . furosemide (LASIX) 40 MG tablet TAKE 1 TABLET BY MOUTH DAILY AS NEEDED FOR EDEMA (EDEMA AND WEIGHT GAIN). 90 tablet 1  . gabapentin (NEURONTIN) 100 MG capsule Take 1 capsule (100 mg total) by mouth at bedtime. 90 capsule 1  . glucose blood (ACCU-CHEK GUIDE) test strip USE AS DIRECTED UP TO 4 TIMES DAILY 100 strip 0  . levothyroxine (SYNTHROID) 112 MCG tablet Take 1 tablet (112 mcg total) by mouth daily before breakfast. 90 tablet 1  . metFORMIN (GLUCOPHAGE) 500 MG tablet Take 1 tablet (500 mg total) by mouth daily with breakfast. 90 tablet 1  . metoprolol succinate (TOPROL XL) 25 MG 24 hr tablet Take  1 tablet (25 mg total) by mouth daily. 90 tablet 3  . Multiple Vitamins-Minerals (MULTIVITAMIN GUMMIES MENS PO) Take 2 tablets by mouth daily with breakfast.     . nitroGLYCERIN (NITROSTAT) 0.4 MG SL tablet Place 1 tablet (0.4 mg total) under the tongue every 5 (five) minutes as needed for chest pain. Up to 3 doses 10 tablet 2  . OXYGEN Inhale 2 L into the lungs at bedtime.    . pantoprazole (PROTONIX) 40 MG tablet Take 1 tablet (40 mg total) by mouth every morning. 90 tablet 1  . sennosides-docusate sodium (SENOKOT-S) 8.6-50 MG tablet Take 1 tablet by mouth as needed for constipation.    . traMADol (ULTRAM) 50 MG tablet Take 1 tablet (50 mg total) by mouth 2 (two) times daily. 180 tablet 0  . traMADol (ULTRAM-ER) 200 MG 24 hr tablet Take 1 tablet (200 mg total) by mouth daily. 90 tablet 0   No current facility-administered medications for this encounter.    No Known Allergies  Social History   Socioeconomic History  . Marital status: Married    Spouse name: [name]  . Number of children: Not on file  . Years of education: 52  . Highest education level: Not on file  Occupational History  . 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 Smoking status: Never Smoker  . Smokeless tobacco: Never Used  Vaping Use  . Vaping Use: Never used  Substance and Sexual Activity  . Alcohol use: No    Alcohol/week: 0.0 standard drinks    Comment: 1 beer a month  . Drug use: No  . Sexual activity: Not Currently  Other Topics Concern  . Not on file  Social History Narrative   Pt lives with wife. Does have stairs, but patient doesn't use them. Pt has completed technical school   Social Determinants of Health   Financial Resource Strain:   . Difficulty of Paying Living Expenses: Not on file  Food Insecurity:   . Worried About Charity fundraiser in the Last Year: Not on file  . Ran Out of Food in the Last Year: Not on file  Transportation Needs:   .  Lack of Transportation (Medical): Not on file  . Lack of Transportation (Non-Medical): Not on file  Physical Activity:   . Days of Exercise per  Week: Not on file  . Minutes of Exercise per Session: Not on file  Stress:   . Feeling of Stress : Not on file  Social Connections:   . Frequency of Communication with Friends and Family: Not on file  . Frequency of Social Gatherings with Friends and Family: Not on file  . Attends Religious Services: Not on file  . Active Member of Clubs or Organizations: Not on file  . Attends Archivist Meetings: Not on file  . Marital Status: Not on file  Intimate Partner Violence:   . Fear of Current or Ex-Partner: Not on file  . Emotionally Abused: Not on file  . Physically Abused: Not on file  . Sexually Abused: Not on file     ROS- All systems are reviewed and negative except as per the HPI above.  Physical Exam: Vitals:   12/13/19 0857  BP: 124/70  Pulse: 61  Weight: 111.3 kg  Height: 6' 3"  (1.905 m)    GEN- The patient is well appearing obese elderly male, alert and oriented x 3 today.   HEENT-head normocephalic, atraumatic, sclera clear, conjunctiva pink, hearing intact, trachea midline. Lungs- Clear to ausculation bilaterally, normal work of breathing Heart- Regular rate and rhythm, no murmurs, rubs or gallops  GI- soft, NT, ND, + BS Extremities- no clubbing, cyanosis, or edema MS- no significant deformity or atrophy Skin- no rash or lesion Psych- euthymic mood, full affect Neuro- strength and sensation are intact   Wt Readings from Last 3 Encounters:  12/13/19 111.3 kg  10/04/19 108.9 kg  09/23/19 109.7 kg    EKG today demonstrates SR HR 61, PAC, PR 196, QRS 96, QTc 479  Echo 02/18/18 demonstrated  - Left ventricle: The cavity size was normal. There was mild focal  basal hypertrophy of the septum. Systolic function was normal.  The estimated ejection fraction was in the range of 55% to 60%.  Wall motion was  normal; there were no regional wall motion  abnormalities. Doppler parameters are consistent with abnormal  left ventricular relaxation (grade 1 diastolic dysfunction).  Doppler parameters are consistent with elevated ventricular  end-diastolic filling pressure.  - Mitral valve: Calcified annulus. Mildly thickened leaflets .  There was mild regurgitation.  - Left atrium: The atrium was mildly dilated.  - Right ventricle: The cavity size was mildly dilated. Wall  thickness was normal. Systolic function was mildly reduced.  - Right atrium: The atrium was normal in size.  - Tricuspid valve: There was mild regurgitation.  - Pulmonary arteries: Systolic pressure was within the normal  range.  - Inferior vena cava: The vessel was normal in size.  - Pericardium, extracardiac: There was no pericardial effusion.   Epic records are reviewed at length today  CHA2DS2-VASc Score = 6  The patient's score is based upon: CHF History: 0 HTN History: 1 Age : 2 Diabetes History: 0 Stroke History: 2 Vascular Disease History: 1 Gender: 0      ASSESSMENT AND PLAN: 1. Longstanding Persistent Atrial Fibrillation (ICD10:  I48.11) The patient's CHA2DS2-VASc score is 6, indicating a 9.7% annual risk of stroke.   S/p dofetilide loading 5/18-5/21/21 Patient appears to be maintaining SR. Continue dofetilide 500 mcg BID. QT stable. Recent bmet/mag reviewed. Continue Eliquis 5 mg BID  Continue diltiazem 180 mg BID Continue Toprol 25 mg daily  2. Secondary Hypercoagulable State (ICD10:  D68.69) The patient is at significant risk for stroke/thromboembolism based upon his CHA2DS2-VASc Score of 6.  Continue Apixaban (Eliquis).  3. Obesity Body mass index is 30.67 kg/m.  Lifestyle modification was discussed and encouraged including regular physical activity and weight reduction.  4. Obstructive sleep apnea Patient diagnosed remotely and did not tolerate CPAP therapy.  5. CAD S/p  CABG No anginal symptoms.   Follow up with [name] and [name] per recall.    [facility] [address] [place],  [address] [phone] 12/13/2019 9:20 AM

## 2019-12-13 ENCOUNTER — Other Ambulatory Visit: Payer: Self-pay

## 2019-12-13 ENCOUNTER — Ambulatory Visit (HOSPITAL_COMMUNITY)
Admission: RE | Admit: 2019-12-13 | Discharge: 2019-12-13 | Disposition: A | Discharge status: Home / Self Care | Payer: Medicare Other | Source: Ambulatory Visit | Attending: Physician Assistant | Admitting: Physician Assistant

## 2019-12-13 ENCOUNTER — Encounter: Payer: Medicare Other | Admitting: Gastroenterology

## 2019-12-13 ENCOUNTER — Encounter (HOSPITAL_COMMUNITY): Payer: Self-pay | Admitting: Physician Assistant

## 2019-12-13 VITALS — BP 124/70 | HR 61 | Ht 75.0 in | Wt 245.4 lb

## 2019-12-13 DIAGNOSIS — D6869 Other thrombophilia: Secondary | ICD-10-CM | POA: Diagnosis not present

## 2019-12-13 DIAGNOSIS — E119 Type 2 diabetes mellitus without complications: Secondary | ICD-10-CM | POA: Diagnosis not present

## 2019-12-13 DIAGNOSIS — Z8673 Personal history of transient ischemic attack (TIA), and cerebral infarction without residual deficits: Secondary | ICD-10-CM | POA: Diagnosis not present

## 2019-12-13 DIAGNOSIS — E669 Obesity, unspecified: Secondary | ICD-10-CM | POA: Diagnosis not present

## 2019-12-13 DIAGNOSIS — Z79890 Hormone replacement therapy (postmenopausal): Secondary | ICD-10-CM | POA: Insufficient documentation

## 2019-12-13 DIAGNOSIS — Z6830 Body mass index (BMI) 30.0-30.9, adult: Secondary | ICD-10-CM | POA: Insufficient documentation

## 2019-12-13 DIAGNOSIS — Z7901 Long term (current) use of anticoagulants: Secondary | ICD-10-CM | POA: Diagnosis not present

## 2019-12-13 DIAGNOSIS — I110 Hypertensive heart disease with heart failure: Secondary | ICD-10-CM | POA: Diagnosis not present

## 2019-12-13 DIAGNOSIS — E785 Hyperlipidemia, unspecified: Secondary | ICD-10-CM | POA: Diagnosis not present

## 2019-12-13 DIAGNOSIS — I4811 Longstanding persistent atrial fibrillation: Secondary | ICD-10-CM | POA: Diagnosis not present

## 2019-12-13 DIAGNOSIS — K219 Gastro-esophageal reflux disease without esophagitis: Secondary | ICD-10-CM | POA: Insufficient documentation

## 2019-12-13 DIAGNOSIS — Z7984 Long term (current) use of oral hypoglycemic drugs: Secondary | ICD-10-CM | POA: Diagnosis not present

## 2019-12-13 DIAGNOSIS — I5032 Chronic diastolic (congestive) heart failure: Secondary | ICD-10-CM | POA: Insufficient documentation

## 2019-12-13 DIAGNOSIS — Z951 Presence of aortocoronary bypass graft: Secondary | ICD-10-CM | POA: Insufficient documentation

## 2019-12-13 DIAGNOSIS — G4733 Obstructive sleep apnea (adult) (pediatric): Secondary | ICD-10-CM | POA: Diagnosis not present

## 2019-12-13 DIAGNOSIS — I081 Rheumatic disorders of both mitral and tricuspid valves: Secondary | ICD-10-CM | POA: Insufficient documentation

## 2019-12-13 DIAGNOSIS — Z79899 Other long term (current) drug therapy: Secondary | ICD-10-CM | POA: Insufficient documentation

## 2019-12-13 DIAGNOSIS — E039 Hypothyroidism, unspecified: Secondary | ICD-10-CM | POA: Diagnosis not present

## 2019-12-13 DIAGNOSIS — I4891 Unspecified atrial fibrillation: Secondary | ICD-10-CM | POA: Diagnosis present

## 2019-12-13 DIAGNOSIS — I2510 Atherosclerotic heart disease of native coronary artery without angina pectoris: Secondary | ICD-10-CM | POA: Diagnosis not present

## 2019-12-20 ENCOUNTER — Other Ambulatory Visit: Payer: Self-pay | Admitting: Internal Medicine

## 2019-12-20 ENCOUNTER — Other Ambulatory Visit: Payer: Self-pay

## 2019-12-20 ENCOUNTER — Encounter: Payer: Medicare Other | Admitting: Gastroenterology

## 2019-12-20 ENCOUNTER — Other Ambulatory Visit: Payer: Self-pay | Admitting: Family

## 2019-12-20 MED ORDER — TRAMADOL HCL 50 MG PO TABS: 50.0000 mg | ORAL_TABLET | Freq: Two times a day (BID) | ORAL | 0 refills | Status: DC

## 2019-12-20 MED ORDER — TRAMADOL HCL ER 200 MG PO TB24
200.0000 mg | ORAL_TABLET | Freq: Every day | ORAL | 0 refills | Status: DC
Start: 2019-12-20 — End: 2020-03-14

## 2019-12-22 ENCOUNTER — Encounter: Payer: Self-pay | Admitting: Gastroenterology

## 2019-12-22 ENCOUNTER — Ambulatory Visit (INDEPENDENT_AMBULATORY_CARE_PROVIDER_SITE_OTHER): Payer: Medicare Other | Admitting: Gastroenterology

## 2019-12-22 ENCOUNTER — Telehealth: Payer: Self-pay

## 2019-12-22 VITALS — BP 114/72 | HR 67 | Ht 75.0 in | Wt 242.8 lb

## 2019-12-22 DIAGNOSIS — Z7901 Long term (current) use of anticoagulants: Secondary | ICD-10-CM | POA: Diagnosis not present

## 2019-12-22 DIAGNOSIS — I2510 Atherosclerotic heart disease of native coronary artery without angina pectoris: Secondary | ICD-10-CM

## 2019-12-22 DIAGNOSIS — K644 Residual hemorrhoidal skin tags: Secondary | ICD-10-CM | POA: Diagnosis not present

## 2019-12-22 DIAGNOSIS — Z9861 Coronary angioplasty status: Secondary | ICD-10-CM

## 2019-12-22 DIAGNOSIS — Z86010 Personal history of colonic polyps: Secondary | ICD-10-CM

## 2019-12-22 MED ORDER — NA SULFATE-K SULFATE-MG SULF 17.5-3.13-1.6 GM/177ML PO SOLN: 1.0000 | Freq: Once | ORAL | 0 refills | Status: AC

## 2019-12-22 NOTE — Telephone Encounter (Signed)
Please see note from [name]

## 2019-12-22 NOTE — Progress Notes (Signed)
History of Present Illness: This is a 74 year old male with personal history of adenomatous colon polyps and external hemorrhoids.  He has a history of atrial fibrillation on Eliquis coronary artery disease status post CABG in 2004 and DES in 1191, diastolic heart failure, OSA on O2 at night, CVA in 1996 and DM.  On colonoscopy in March 2012 he had one 6 mm tubular adenoma.  A 5-year interval surveillance was recommended which was not done.   He was evaluated by Carl Best, NP in March 2021 for these same reasons.  He has an external hemorrhoid that is difficult to clean following bowel movements and he would like to have it removed.  He is very frustrated by this problem that has been present for many years and he would like to have his hemorrhoid removed.  He was under the impression that hemorrhoidal banding would be appropriate treatment for his his external hemorrhoid.  Surgical referral for external hemorrhoidectomy was recommended in March 2021 and he was advised to speak with his cardiologist regarding surgical risk and surgical clearance.  He was also recommended to have a colonoscopy.  Current Medications, Allergies, Past Medical History, Past Surgical History, Family History and Social History were reviewed in [employer] record.   Physical Exam: General: Well developed, well nourished, no acute distress Head: Normocephalic and atraumatic Eyes:  sclerae anicteric, EOMI Ears: Normal auditory acuity Mouth: Not examined, mask on during Covid-19 pandemic Lungs: Clear throughout to auscultation Heart: Regular rate and rhythm; no murmurs, rubs or bruits Abdomen: Soft, non tender and non distended. No masses, hepatosplenomegaly or hernias noted. Normal Bowel sounds Rectal: Deferred to colonoscopy. DRE in March 2021 showed a moderate sized noninflamed posterior external hemorrhoid, no internal lesions, no tenderness and Hemoccult negative  stool Musculoskeletal: Symmetrical with no gross deformities  Pulses:  Normal pulses noted Extremities: No clubbing, cyanosis, edema or deformities noted Neurological: Alert oriented x 4, grossly nonfocal Psychological:  Alert and cooperative. Normal mood and affect   Assessment and Recommendations:  1. Personal history of adenomatous colon polyps. Schedule surveillance colonoscopy. Patient would like this procedure performed as soon as possible and my first hospital procedure availability is December 20. He requested an earlier colonoscopy with another provider if possible. Dr. Bryan Lemma has hospital availability in early December and we will schedule with him. The risks (including bleeding, perforation, infection, missed lesions, medication reactions and possible hospitalization or surgery if complications occur), benefits, and alternatives to colonoscopy with possible biopsy and possible polypectomy were discussed with the patient and they consent to proceed. This will likely be his last surveillance colonoscopy.   2.  External hemorrhoid leading to ongoing hygiene difficulties.  He is frustrated with his external hemorrhoid.  We discussed the role of hemorrhoidal banding for internal hemorrhoids and standard measures for care of external hemorrhoids including the option of surgical management.  Baby wipes, sitz baths, Anusol HC cream bid prn were recommended for his external hemorrhoid however the patient states all have been tried and have not been helpful.  Referral to a colorectal surgeon to evaluate. Further evaluate with colonoscopy for internal hemorrhoids and other colorectal disorders as above.   3. Afib on Eliquis. Hold Eliquis 2 days before procedure - will instruct when and how to resume after procedure. Low but real risk of cardiovascular event such as heart attack, stroke, embolism, thrombosis or ischemia/infarct of other organs off Elqiuis explained and need to seek urgent help if  this occurs.  The patient consents to proceed. Will communicate by phone or EMR with patient's prescribing provider to confirm that holding Eliquis is reasonable in this case.   4. CAD, S/P CABG, DES, CHF with EF 55-60%.  5. OSA on O2 at night.   6.  History of CVA.

## 2019-12-22 NOTE — Telephone Encounter (Signed)
   Primary Cardiologist: Candee Furbish, MD  Chart reviewed as part of pre-operative protocol coverage. Given past medical history and time since last visit, based on ACC/AHA guidelines, [name] would be at acceptable risk for the planned procedure without further cardiovascular testing.   He may hold his Eliquis for 1 day (2 doses) before his colonoscopy and may hold 2 doses the day of the colonoscopy and resume 1 day post colonoscopy.  We would like to minimize this time off anticoagulation.  He is at high risk for CVA.  I will route this recommendation to the requesting party via Epic fax function and remove from pre-op pool.  Please call with questions.  Jossie Ng. Shivaan Tierno NP-C    12/22/2019, 4:09 PM [facility] [place] [address] Office [phone] Fax [phone]

## 2019-12-22 NOTE — Patient Instructions (Signed)
You have been scheduled for an appointment with _________________ at [facility]. Your appointment is on ________________ at ______________________. Please arrive at ________________________ for registration. Make certain to bring a list of current medications, including any over the counter medications or vitamins. Also bring your co-pay if you have one as well as your insurance cards. [facility] is located at [address], [address]. Should you need to reschedule your appointment, please contact them at [phone].   You have been scheduled for a colonoscopy. Please follow written instructions given to you at your visit today.  Please pick up your prep supplies at the pharmacy within the next 1-3 days. If you use inhalers (even only as needed), please bring them with you on the day of your procedure.   Thank you for choosing me and [facility].  Pricilla Riffle. Dagoberto Ligas., MD., Marval Regal

## 2019-12-22 NOTE — Telephone Encounter (Signed)
[facility] Pre-operative Risk Assessment     Request for surgical clearance:     Endoscopy Procedure  What type of surgery is being performed?     Colonoscopy  When is this surgery scheduled?     02/22/20  What type of clearance is required ?   Pharmacy  Are there any medications that need to be held prior to surgery and how long? Eliquis x 2 days  Practice name and name of physician performing surgery?      [facility]  What is your office phone and fax number?      Phone- [phone]  [phone]  Anesthesia type (None, local, MAC, general) ?       MAC

## 2019-12-22 NOTE — Telephone Encounter (Signed)
Patient with diagnosis of A Fib on Eliquis for anticoagulation.    Procedure: colonoscopy Date of procedure: 02/22/20  CHADS2-VASc score of  8 (CHF, HTN, AGE, DM2, stroke/tia x 2, CAD, AGE)  CrCl 76.77ml/min using adjusted body weight Platelet count 158K  Patient at high risk off anticoagulation and has history of stroke with A Fib.  Will forward to MD for input

## 2019-12-22 NOTE — Telephone Encounter (Signed)
I think holding Eliquis for 1 day, i.e. 2 doses the day before, and 2 doses the day of the colonoscopy and then resuming 1 day post colonoscopy would make sense.  We want to minimize the time he is off of anticoagulation.  He is at increased risk for stroke.  Candee Furbish, MD

## 2019-12-23 NOTE — Telephone Encounter (Signed)
Informed patient detail instructions of holding his Eliquis per Cardiology. Patient states he wrote down instructions and verbalized understanding. Quintin Alto, CMA witnessed this conversation.

## 2019-12-29 ENCOUNTER — Encounter: Payer: Medicare Other | Attending: Physical Medicine & Rehabilitation | Admitting: Physical Medicine & Rehabilitation

## 2019-12-29 DIAGNOSIS — G8110 Spastic hemiplegia affecting unspecified side: Secondary | ICD-10-CM | POA: Insufficient documentation

## 2020-01-06 ENCOUNTER — Other Ambulatory Visit: Payer: Self-pay | Admitting: Family

## 2020-01-07 ENCOUNTER — Other Ambulatory Visit: Payer: Self-pay | Admitting: Family

## 2020-01-08 ENCOUNTER — Other Ambulatory Visit: Payer: Self-pay | Admitting: Cardiology

## 2020-01-09 ENCOUNTER — Telehealth: Payer: Self-pay | Admitting: *Deleted

## 2020-01-09 ENCOUNTER — Telehealth: Payer: Self-pay | Admitting: Family

## 2020-01-09 ENCOUNTER — Encounter: Payer: Medicare Other | Admitting: Gastroenterology

## 2020-01-09 MED ORDER — METHYLPREDNISOLONE 4 MG PO TBPK: ORAL_TABLET | ORAL | 0 refills | Status: DC

## 2020-01-09 NOTE — Telephone Encounter (Signed)
[name] came by the office to request a steroid dose pack for his back. I spoke with Dr Letta Pate and he gave verbal order to send one to the pharmacy.  [name] has follow up next week for botox.

## 2020-01-09 NOTE — Telephone Encounter (Signed)
Age 73, weight 110kg, SCr 1.1 on 10/04/19 afib indication, last visit 12/13/19

## 2020-01-09 NOTE — Telephone Encounter (Signed)
Please contact pt to schedule follow up appointment with me.

## 2020-01-09 NOTE — Telephone Encounter (Signed)
appt scheduled with pt

## 2020-01-17 ENCOUNTER — Encounter: Payer: Medicare Other | Attending: Physical Medicine & Rehabilitation | Admitting: Physical Medicine & Rehabilitation

## 2020-01-17 ENCOUNTER — Other Ambulatory Visit: Payer: Self-pay

## 2020-01-17 ENCOUNTER — Encounter: Payer: Self-pay | Admitting: Physical Medicine & Rehabilitation

## 2020-01-17 VITALS — BP 104/66 | HR 62 | Temp 98.6°F | Ht 75.0 in | Wt 242.0 lb

## 2020-01-17 DIAGNOSIS — G8112 Spastic hemiplegia affecting left dominant side: Secondary | ICD-10-CM | POA: Diagnosis not present

## 2020-01-17 NOTE — Patient Instructions (Signed)

## 2020-01-17 NOTE — Progress Notes (Signed)
Botox Injection for spasticity using needle EMG guidance °  °Dilution: 50 Units/ml °Indication: Severe spasticity which interferes with ADL,mobility and/or  hygiene and is unresponsive to medication management and other conservative care °Informed consent was obtained after describing risks and benefits of the procedure with the patient. This includes bleeding, bruising, infection, excessive weakness, or medication side effects. A REMS form is on file and signed. °Needle: 27g 1" needle electrode °Number of units per muscle °  ° left FDP 25 units ° left FDS 25 units °left biceps 50 units °[id number] °All injections were done after obtaining appropriate EMG activity and after negative drawback for blood. The patient tolerated the procedure well. Post procedure instructions were given. A followup appointment was made.     °

## 2020-01-24 ENCOUNTER — Encounter: Payer: Self-pay | Admitting: Family

## 2020-01-24 ENCOUNTER — Ambulatory Visit (INDEPENDENT_AMBULATORY_CARE_PROVIDER_SITE_OTHER): Payer: Medicare Other | Admitting: Family

## 2020-01-24 ENCOUNTER — Other Ambulatory Visit: Payer: Self-pay

## 2020-01-24 VITALS — BP 131/68 | HR 63 | Temp 98.6°F | Resp 16 | Ht 75.0 in | Wt 241.0 lb

## 2020-01-24 DIAGNOSIS — Z23 Encounter for immunization: Secondary | ICD-10-CM

## 2020-01-24 DIAGNOSIS — E1169 Type 2 diabetes mellitus with other specified complication: Secondary | ICD-10-CM | POA: Diagnosis not present

## 2020-01-24 DIAGNOSIS — E785 Hyperlipidemia, unspecified: Secondary | ICD-10-CM

## 2020-01-24 DIAGNOSIS — G6289 Other specified polyneuropathies: Secondary | ICD-10-CM

## 2020-01-24 DIAGNOSIS — I2510 Atherosclerotic heart disease of native coronary artery without angina pectoris: Secondary | ICD-10-CM

## 2020-01-24 DIAGNOSIS — K644 Residual hemorrhoidal skin tags: Secondary | ICD-10-CM | POA: Diagnosis not present

## 2020-01-24 DIAGNOSIS — E611 Iron deficiency: Secondary | ICD-10-CM

## 2020-01-24 DIAGNOSIS — I480 Paroxysmal atrial fibrillation: Secondary | ICD-10-CM

## 2020-01-24 DIAGNOSIS — E039 Hypothyroidism, unspecified: Secondary | ICD-10-CM

## 2020-01-24 DIAGNOSIS — E118 Type 2 diabetes mellitus with unspecified complications: Secondary | ICD-10-CM | POA: Diagnosis not present

## 2020-01-24 DIAGNOSIS — Z9861 Coronary angioplasty status: Secondary | ICD-10-CM

## 2020-01-24 DIAGNOSIS — M109 Gout, unspecified: Secondary | ICD-10-CM

## 2020-01-24 LAB — LIPID PANEL
Cholesterol: 135 mg/dL (ref 0–200)
HDL: 44.8 mg/dL (ref >39.00)
LDL Cholesterol: 66 mg/dL (ref 0–99)
NonHDL: 90.05
Total CHOL/HDL Ratio: 3
Triglycerides: 120 mg/dL (ref 0.0–149.0)
VLDL: 24 mg/dL (ref 0.0–40.0)

## 2020-01-24 LAB — CBC WITH DIFFERENTIAL/PLATELET
Basophils Absolute: 0.1 10*3/uL (ref 0.0–0.1)
Basophils Relative: 1.2 % (ref 0.0–3.0)
Eosinophils Absolute: 0.3 10*3/uL (ref 0.0–0.7)
Eosinophils Relative: 4.4 % (ref 0.0–5.0)
HCT: 40.2 % (ref 39.0–52.0)
Hemoglobin: 13.3 g/dL (ref 13.0–17.0)
Lymphocytes Relative: 24.3 % (ref 12.0–46.0)
Lymphs Abs: 1.8 10*3/uL (ref 0.7–4.0)
MCHC: 33.1 g/dL (ref 30.0–36.0)
MCV: 94.1 fl (ref 78.0–100.0)
Monocytes Absolute: 0.6 10*3/uL (ref 0.1–1.0)
Monocytes Relative: 8.1 % (ref 3.0–12.0)
Neutro Abs: 4.6 10*3/uL (ref 1.4–7.7)
Neutrophils Relative %: 62 % (ref 43.0–77.0)
Platelets: 161 10*3/uL (ref 150.0–400.0)
RBC: 4.27 Mil/uL (ref 4.22–5.81)
RDW: 14 % (ref 11.5–15.5)
WBC: 7.4 10*3/uL (ref 4.0–10.5)

## 2020-01-24 LAB — COMPREHENSIVE METABOLIC PANEL
ALT: 24 U/L (ref 0–53)
AST: 28 U/L (ref 0–37)
Albumin: 3.9 g/dL (ref 3.5–5.2)
Alkaline Phosphatase: 61 U/L (ref 39–117)
BUN: 17 mg/dL (ref 6–23)
CO2: 34 mEq/L — ABNORMAL HIGH (ref 19–32)
Calcium: 9.1 mg/dL (ref 8.4–10.5)
Chloride: 99 mEq/L (ref 96–112)
Creatinine, Ser: 1.15 mg/dL (ref 0.40–1.50)
GFR: 61.74 mL/min (ref >60.00)
Glucose, Bld: 99 mg/dL (ref 70–99)
Potassium: 4.4 mEq/L (ref 3.5–5.1)
Sodium: 139 mEq/L (ref 135–145)
Total Bilirubin: 0.6 mg/dL (ref 0.2–1.2)
Total Protein: 6.5 g/dL (ref 6.0–8.3)

## 2020-01-24 LAB — HEMOGLOBIN A1C: Hgb A1c MFr Bld: 6.2 % (ref 4.6–6.5)

## 2020-01-24 LAB — FERRITIN: Ferritin: 254.5 ng/mL (ref 22.0–322.0)

## 2020-01-24 LAB — IRON: Iron: 76 ug/dL (ref 42–165)

## 2020-01-24 NOTE — Patient Instructions (Addendum)
Please get your Covid booster. Complete lab work prior to leaving.

## 2020-01-24 NOTE — Progress Notes (Signed)
Subjective:    Patient ID: [name], male    DOB: Oct 18, 1945, 74 y.o.   MRN: [id number]  HPI   Patient is a 74 yr old male who presents today for follow up.  AF- reports feeling great on tikosyn.   DM2- on glucophage 500 mg. Reports that he is checking sugars occasionally: 109-126.    Lab Results  Component Value Date   HGBA1C 5.9 05/25/2019   HGBA1C 6.9 (H) 11/28/2018   HGBA1C 6.7 (H) 05/11/2018   Lab Results  Component Value Date   MICROALBUR <0.7 09/26/2014   LDLCALC 49 12/09/2018   CREATININE 1.10 10/04/2019   Iron deficiency- He is maintained on iron 3107m.  Lab Results  Component Value Date   WBC 7.3 04/14/2019   HGB 13.6 04/14/2019   HCT 39.4 04/14/2019   MCV 88 04/14/2019   PLT 158 04/14/2019   Gout- maintained on allopurinol 1037m Reports no recent gout.  He does reports some pain at the base on his right thumb.  States that he saw ortho and was told that it was osteoarthritis.   Hypothyroid- maintained on synthroid.  Lab Results  Component Value Date   TSH 1.55 05/25/2019   GERD- denies gerd symptoms  Peripheral neuropathy- using gabapentin HS.    Hyperlipidemia- maintained on atorvastatin.   HTN- on lotensin 1061m 5mg71mce daily as well as diltiazem.  BP Readings from Last 3 Encounters:  01/24/20 131/68  01/17/20 104/66  12/22/19 114/72   He reports hemorrhoid is really bothering him.  Feels like he can't get comfortable with it. Following with GI    Review of Systems See HPI  Past Medical History:  Diagnosis Date   Acute encephalopathy 12/12/2015   Acute respiratory failure with hypoxia and hypercapnia (HCC) 12/12/2015   Allergic rhinitis 02/13/2010   Qualifier: Diagnosis of  By: Yoo Wynona LunaAngina at rest ([facility]   Ataxia 08/10/2013   Atrial fibrillation (HCC)[place]/2020   B12 deficiency 08/11/2013   BACK PAIN, LUMBAR 01/29/2009   Qualifier: Diagnosis of  By: Yoo Wynona LunaBENIGN POSITIONAL  VERTIGO 11/23/2009   Qualifier: Diagnosis of  By: Yoo Wynona LunaChronic diastolic heart failure (HCC)[place]7/2017   Chronic pain    left sided-Kristeins   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)[place]/20/2017   s/p CABG 2004; CFX DES 2011   Essential hypertension 12/12/2006   Qualifier: Diagnosis of  By: BranMarca Ancona, Lucy     GERD 12/12/2006   Qualifier: Diagnosis of  By: BranReatha ArmourcyC[place] GOUT 12/12/2006   Qualifier: Diagnosis of  By: BranMarca Ancona, Lucy     Hyperlipidemia LDL goal <70 12/12/2006   Qualifier: Diagnosis of  By: BranMarca Ancona, LucySalem  Hypogonadism male 04/10/2011   Hypothyroidism, acquired 02/15/2013   Insomnia 05/21/2013   Long term current use of anticoagulant therapy 06/24/2011   Obstructive sleep apnea-Failed CPAP 06/17/2007   Failed CPAP Using O 2 for sleep    Paroxysmal atrial fibrillation (HCC)[place]22/2018   Stroke (cerebrum) (HCC)[place]/30/2018   also 1996; resultant hemiplegia of dominant side   Thoracic aorta atherosclerosis (HCC)[place]11/2019   Tubular adenoma of colon 05/2010   Type 2 diabetes mellitus without complication, without long-term current use of insulin (HCC)[place]21/2009   Qualifier: Diagnosis of  By: Yoo Wynona LunaVasovagal  syncope      Social History   Socioeconomic History   Marital status: Married    Spouse name: [name]   Number of children: Not on file   Years of education: 14   Highest education level: Not on file  Occupational History   Occupation: retired    Fish farm manager: OTHER    Comment: Mining engineer  Tobacco Use   Smoking status: Never Smoker   Smokeless tobacco: Never Used  Scientific laboratory technician Use: Never used  Substance and Sexual Activity   Alcohol use: Not Currently   Drug use: No   Sexual activity: Not Currently  Other Topics Concern   Not on file  Social History Narrative   Pt lives with wife. Does have stairs, but patient doesn't use them. Pt has completed technical  school   Social Determinants of Health   Financial Resource Strain:    Difficulty of Paying Living Expenses: Not on file  Food Insecurity:    Worried About [place] in the Last Year: Not on file   [employer] of Food in the Last Year: Not on file  Transportation Needs:    Lack of Transportation (Medical): Not on file   Lack of Transportation (Non-Medical): Not on file  Physical Activity:    Days of Exercise per Week: Not on file   Minutes of Exercise per Session: Not on file  Stress:    Feeling of Stress : Not on file  Social Connections:    Frequency of Communication with Friends and Family: Not on file   Frequency of Social Gatherings with Friends and Family: Not on file   Attends Religious Services: Not on file   Active Member of Clubs or Organizations: Not on file   Attends Archivist Meetings: Not on file   Marital Status: Not on file  Intimate Partner Violence:    Fear of Current or Ex-Partner: Not on file   Emotionally Abused: Not on file   Physically Abused: Not on file   Sexually Abused: Not on file    Past Surgical History:  Procedure Laterality Date   CARDIAC CATHETERIZATION N/A 10/25/2015   Procedure: Left Heart Cath and Cors/Grafts Angiography;  Surgeon: Troy Sine, MD;  Location: [place] [facility];  Service: Cardiovascular;  Laterality: N/A;   CARDIOVERSION  06/20/2011   Procedure: CARDIOVERSION;  Surgeon: Larey Dresser, MD;  Location: [place];  Service: Cardiovascular;  Laterality: N/A;   CARDIOVERSION N/A 04/26/2015   Procedure: CARDIOVERSION;  Surgeon: Dorothy Spark, MD;  Location: [facility] ENDOSCOPY;  Service: Cardiovascular;  Laterality: N/A;   CARDIOVERSION N/A 05/10/2015   Procedure: CARDIOVERSION;  Surgeon: Larey Dresser, MD;  Location: [place];  Service: Cardiovascular;  Laterality: N/A;   CARDIOVERSION N/A 12/10/2018   Procedure: CARDIOVERSION;  Surgeon: Sanda Klein, MD;  Location: [place];   Service: Cardiovascular;  Laterality: N/A;   CATARACT EXTRACTION  05/2010   left eye   CATARACT EXTRACTION  04/2010   right eye   CORONARY ARTERY BYPASS GRAFT     stent   ESOPHAGOGASTRODUODENOSCOPY  03-25-2005   LEFT HEART CATH AND CORS/GRAFTS ANGIOGRAPHY N/A 02/19/2018   Procedure: LEFT HEART CATH AND CORS/GRAFTS ANGIOGRAPHY;  Surgeon: Martinique, Peter M, MD;  Location: [place] [facility];  Service: Cardiovascular;  Laterality: N/A;   NASAL SEPTUM SURGERY     PERCUTANEOUS PLACEMENT INTRAVASCULAR STENT CERVICAL CAROTID ARTERY     03-2009; using a drug-eluting platform of the circumflex cornoray  artery with a 3.0 x 18 [employer] Promus drug-eluting platform post dilated to 3.75 with a noncompliant balloon.   TEE WITHOUT CARDIOVERSION  06/20/2011   Procedure: TRANSESOPHAGEAL ECHOCARDIOGRAM (TEE);  Surgeon: Larey Dresser, MD;  Location: [facility] ENDOSCOPY;  Service: Cardiovascular;  Laterality: N/A;    Family History  Problem Relation Age of Onset   Lung cancer Father        deceased   Stroke Mother        deceased-MINISTROKES   Colon cancer Neg Hx    Esophageal cancer Neg Hx    Stomach cancer Neg Hx    Pancreatic cancer Neg Hx    Liver disease Neg Hx     No Known Allergies  Current Outpatient Medications on File Prior to Visit  Medication Sig Dispense Refill   Accu-Chek FastClix Lancets MISC USE TO TEST 3 TIMES DAILY 102 each 0   acetaminophen (TYLENOL) 325 MG tablet Take 2 tablets (650 mg total) by mouth every 4 (four) hours as needed for headache or mild pain.     allopurinol (ZYLOPRIM) 100 MG tablet TAKE 2 TABLETS BY MOUTH EVERY DAY 180 tablet 1   atorvastatin (LIPITOR) 40 MG tablet Take 1 tablet (40 mg total) by mouth daily. 90 tablet 3   benazepril (LOTENSIN) 10 MG tablet TAKE 1 TABLET BY MOUTH EVERY DAY 90 tablet 0   benazepril (LOTENSIN) 5 MG tablet TAKE 1 TABLET BY MOUTH DAILY WITH 10MG OF BENAZEPRIL TO EQUAL 15MG DAILY 90 tablet 1   blood glucose meter kit  and supplies KIT Dispense based on patient and insurance preference. Use up to four times daily as directed. (FOR ICD-9 250.00, 250.01). 102 each 0   cyanocobalamin 1000 MCG tablet Take 2,000 mcg by mouth daily. Take 2 Gummies daily     diclofenac Sodium (VOLTAREN) 1 % GEL USE AS DIRECTED 100 g 6   diltiazem (CARDIZEM CD) 180 MG 24 hr capsule Take 1 capsule (180 mg total) by mouth daily. 30 capsule 6   dofetilide (TIKOSYN) 500 MCG capsule Take 1 capsule (500 mcg total) by mouth 2 (two) times daily. 60 capsule 6   ELIQUIS 5 MG TABS tablet TAKE 1 TABLET BY MOUTH TWICE A DAY 180 tablet 1   ferrous sulfate 325 (65 FE) MG tablet Take 1 tablet (325 mg total) by mouth 2 (two) times daily with a meal. (Patient taking differently: Take 650 mg by mouth every evening. ) 180 tablet 1   fluticasone (FLONASE) 50 MCG/ACT nasal spray Place 2 sprays into both nostrils as needed for allergies or rhinitis. 16 g 0   folic acid (FOLVITE) 1 MG tablet TAKE 1 TABLET BY MOUTH EVERY DAY 90 tablet 0   furosemide (LASIX) 40 MG tablet TAKE 1 TABLET BY MOUTH DAILY AS NEEDED FOR EDEMA (EDEMA AND WEIGHT GAIN). 90 tablet 1   gabapentin (NEURONTIN) 100 MG capsule TAKE 1 CAPSULE BY MOUTH AT BEDTIME. 90 capsule 1   glucose blood (ACCU-CHEK GUIDE) test strip USE AS DIRECTED UP TO 4 TIMES DAILY 100 strip 0   levothyroxine (SYNTHROID) 112 MCG tablet Take 1 tablet (112 mcg total) by mouth daily before breakfast. 90 tablet 1   metFORMIN (GLUCOPHAGE) 500 MG tablet Take 1 tablet (500 mg total) by mouth daily with breakfast. 90 tablet 1   methylPREDNISolone (MEDROL DOSEPAK) 4 MG TBPK tablet Take as directed 21 tablet 0   metoprolol succinate (TOPROL-XL) 25 MG 24 hr tablet TAKE 1 TABLET BY MOUTH EVERY DAY 90 tablet  2   Multiple Vitamins-Minerals (MULTIVITAMIN GUMMIES MENS PO) Take 2 tablets by mouth daily with breakfast.      nitroGLYCERIN (NITROSTAT) 0.4 MG SL tablet Place 1 tablet (0.4 mg total) under the tongue every 5  (five) minutes as needed for chest pain. Up to 3 doses 10 tablet 2   OXYGEN Inhale 2 L into the lungs at bedtime.     pantoprazole (PROTONIX) 40 MG tablet TAKE 1 TABLET BY MOUTH EVERY DAY IN THE MORNING 90 tablet 0   sennosides-docusate sodium (SENOKOT-S) 8.6-50 MG tablet Take 1 tablet by mouth as needed for constipation.     SUPREP BOWEL PREP KIT 17.5-3.13-1.6 GM/177ML SOLN SMARTSIG:354 Milliliter(s) By Mouth As Directed     traMADol (ULTRAM) 50 MG tablet Take 1 tablet (50 mg total) by mouth 2 (two) times daily. 180 tablet 0   traMADol (ULTRAM-ER) 200 MG 24 hr tablet Take 1 tablet (200 mg total) by mouth daily. 90 tablet 0   No current facility-administered medications on file prior to visit.    BP 131/68 (BP Location: Right Arm, Patient Position: Sitting, Cuff Size: Large)    Pulse 63    Temp 98.6 F (37 C) (Oral)    Resp 16    Ht 6' 3"  (1.905 m)    Wt 241 lb (109.3 kg)    SpO2 98%    BMI 30.12 kg/m       Objective:   Physical Exam Constitutional:      General: He is not in acute distress.    Appearance: He is well-developed.  HENT:     Head: Normocephalic and atraumatic.  Cardiovascular:     Rate and Rhythm: Normal rate and regular rhythm.     Heart sounds: No murmur heard.   Pulmonary:     Effort: Pulmonary effort is normal. No respiratory distress.     Breath sounds: Normal breath sounds. No wheezing or rales.  Skin:    General: Skin is warm and dry.  Neurological:     Mental Status: He is alert and oriented to person, place, and time.  Psychiatric:        Behavior: Behavior normal.        Thought Content: Thought content normal.           Assessment & Plan:  AF- rate stable. Management per EP.   Hemorrhoid- he is very distressed about the hemhorroid and has a follow up scheduled with the surgeon. He is also scheduled for a colonoscopy.   GERD- stable on protonix 61m.  DM2- clinically stable on metformin 5072mbid. Check A1C.  Iron deficiency anemia-  continue iron 32556mCheck follow up CBC/iron levels.  Hyperlipidemia-check follow up lipid panel. Continue atovastatin 71m58mce daily.   Lab Results  Component Value Date   CHOL 135 01/24/2020   HDL 44.80 01/24/2020   LDLCALC 66 01/24/2020   TRIG 120.0 01/24/2020   CHOLHDL 3 01/24/2020   Peripheral neuropathy- stable on gabapentin 100mg11mQHS. Continue same.   HTN- bp stable. Continue lotensin 15mg,26mtiazem 180mg, 60mol xl 25mg.  55mt- stable on allopurinol 100mg onc81mily. Continue same.  Hypothyroid- clinically stable on synthroid, continue same.   This visit occurred during the SARS-CoV-2 public health emergency.  Safety protocols were in place, including screening questions prior to the visit, additional usage of staff PPE, and extensive cleaning of exam room while observing appropriate contact time as indicated for disinfecting solutions.

## 2020-01-30 ENCOUNTER — Encounter: Payer: Medicare Other | Admitting: Gastroenterology

## 2020-02-03 ENCOUNTER — Other Ambulatory Visit: Payer: Self-pay | Admitting: Physician Assistant

## 2020-02-03 ENCOUNTER — Other Ambulatory Visit: Payer: Self-pay | Admitting: Family

## 2020-02-15 ENCOUNTER — Other Ambulatory Visit: Payer: Self-pay

## 2020-02-15 ENCOUNTER — Encounter (HOSPITAL_COMMUNITY): Payer: Self-pay | Admitting: Gastroenterology

## 2020-02-17 ENCOUNTER — Encounter: Payer: Medicare Other | Attending: Physical Medicine & Rehabilitation | Admitting: Physical Medicine & Rehabilitation

## 2020-02-17 ENCOUNTER — Other Ambulatory Visit: Payer: Self-pay

## 2020-02-17 ENCOUNTER — Encounter: Payer: Self-pay | Admitting: Physical Medicine & Rehabilitation

## 2020-02-17 VITALS — BP 124/76 | HR 72 | Temp 97.9°F | Ht 75.0 in | Wt 242.4 lb

## 2020-02-17 DIAGNOSIS — M533 Sacrococcygeal disorders, not elsewhere classified: Secondary | ICD-10-CM | POA: Diagnosis not present

## 2020-02-17 NOTE — Progress Notes (Signed)
Bilateral sacroiliac injections under fluoroscopic guidance  Indication: Low back and buttocks pain not relieved by medication management and other conservative care.  Informed consent was obtained after describing risks and benefits of the procedure with the patient, this includes bleeding, bruising, infection, paralysis and medication side effects. The patient wishes to proceed and has given written consent. The patient was placed in a prone position. The lumbar and sacral area was marked and prepped with Betadine. A 25-gauge 1-1/2 inch needle was inserted into the skin and subcutaneous tissue and 1 mL of 1% lidocaine was injected into each side. Then a 25-gauge 3 inch spinal needle was inserted under fluoroscopic guidance into the left sacroiliac joint. AP and lateral images were utilized. Omnipaque 180x0.5 mL under live fluoroscopy demonstrated no intravascular uptake. Then a solution containing one ML of 6 mg per mL Celestone in 2 ML of 2% lidocaine MPF was injected x1.5 mL. This same procedure was repeated on the right side using the same needle, injectate, and technique. Patient tolerated the procedure well. Post procedure instructions were given. Please see post procedure form. 

## 2020-02-17 NOTE — Progress Notes (Signed)
  PROCEDURE RECORD [facility]   Name: [name] DOB:[birth year] MRN: [id number]  Date:02/17/2020  Physician: Alysia Penna, MD    Nurse/CMA: Betti Cruz  Allergies: No Known Allergies  Consent Signed: Yes.    Is patient diabetic? Yes.    CBG today? 106  Pregnant: No. LMP: No LMP for male patient. (age 76-53)  Anticoagulants: yes (eliquis) Anti-inflammatory: no Antibiotics: no  Procedure: Bilateral Sacroiliac Joint Inj Position: Prone Start Time: 12:53pm  End Time: 1:03 pm Fluoro Time: 35  RN/CMA Truman Hayward, CMA Vonnetta Akey, CMA    Time 12:34pm 1:11pm    BP 124/76 104/63    Pulse 72 79    Respirations 16 16    O2 Sat 93 95    S/S 6 6    Pain Level 6/10 3/10     D/C home with no one, patient A & O X 3, D/C instructions reviewed, and sits independently.

## 2020-02-17 NOTE — Patient Instructions (Signed)
Sacroiliac injection was performed today. A combination of a naming medicine plus a cortisone medicine was injected. The injection was done under x-ray guidance. This procedure has been performed to help reduce low back and buttocks pain as well as potentially hip pain. The duration of this injection is variable lasting from hours to  Months. It may repeated if needed. 

## 2020-02-18 ENCOUNTER — Other Ambulatory Visit (HOSPITAL_COMMUNITY)
Admission: RE | Admit: 2020-02-18 | Discharge: 2020-02-18 | Disposition: A | Discharge status: Home / Self Care | Payer: Medicare Other | Source: Ambulatory Visit | Attending: Orthopaedic Surgery | Admitting: Orthopaedic Surgery

## 2020-02-18 DIAGNOSIS — Z01812 Encounter for preprocedural laboratory examination: Secondary | ICD-10-CM | POA: Insufficient documentation

## 2020-02-18 DIAGNOSIS — Z20822 Contact with and (suspected) exposure to covid-19: Secondary | ICD-10-CM | POA: Diagnosis not present

## 2020-02-18 LAB — SARS CORONAVIRUS 2 (TAT 6-24 HRS): SARS Coronavirus 2: NEGATIVE

## 2020-02-21 NOTE — Progress Notes (Signed)
Pre op call done for endo procedure 02/22/20. Patient states he has been quarantined since covid test, confirmed he has a ride home post procedure, and will take colon prep according to directions given. All questions addressed.

## 2020-02-21 NOTE — Anesthesia Preprocedure Evaluation (Addendum)
Anesthesia Evaluation  Patient identified by MRN, date of birth, ID band Patient awake    Reviewed: Allergy & Precautions, NPO status , Patient's Chart, lab work & pertinent test results, reviewed documented beta blocker date and time   Airway Mallampati: II  TM Distance: >3 FB Neck ROM: Full    Dental no notable dental hx. (+) Teeth Intact, Dental Advisory Given   Pulmonary sleep apnea (2L at night) and Oxygen sleep apnea ,    Pulmonary exam normal breath sounds clear to auscultation       Cardiovascular hypertension, Pt. on medications and Pt. on home beta blockers + angina + CAD, + Cardiac Stents (2011), + CABG (2004) and +CHF  Normal cardiovascular exam+ dysrhythmias Atrial Fibrillation  Rhythm:Regular Rate:Normal  LHC 2019 1. Left dominant circulation 2. Severe 3 vessel occlusive CAD.    - 95% ostial LAD    - 100% first diagonal    - 100% ostial LCx. This is new since 2017 - was 95% prior    - 100% nondominant RCA 3. Patent LIMA to the LAD. There is chronic severe disease in the apical LAD 4. Patent SVG to the first diagonal and SVG to the first OM as a Y graft. 5. Chronic occlusion of the SVG to terminal LCx 6. Normal LVEDP  TTE 2019 EF 55-60%, G1DD, mild MR   Neuro/Psych CVA (left sided weakness), Residual Symptoms negative psych ROS   GI/Hepatic Neg liver ROS, GERD  Medicated,  Endo/Other  diabetes, Oral Hypoglycemic AgentsHypothyroidism   Renal/GU negative Renal ROS  negative genitourinary   Musculoskeletal negative musculoskeletal ROS (+)   Abdominal   Peds  Hematology  (+) Blood dyscrasia (on eliquis), ,   Anesthesia Other Findings   Reproductive/Obstetrics                            Anesthesia Physical Anesthesia Plan  ASA: III  Anesthesia Plan: MAC   Post-op Pain Management:    Induction: Intravenous  PONV Risk Score and Plan: 1 and Propofol infusion and Treatment  may vary due to age or medical condition  Airway Management Planned: Natural Airway  Additional Equipment:   Intra-op Plan:   Post-operative Plan:   Informed Consent: I have reviewed the patients History and Physical, chart, labs and discussed the procedure including the risks, benefits and alternatives for the proposed anesthesia with the patient or authorized representative who has indicated his/her understanding and acceptance.     Dental advisory given  Plan Discussed with: CRNA  Anesthesia Plan Comments:         Anesthesia Quick Evaluation

## 2020-02-22 ENCOUNTER — Ambulatory Visit (HOSPITAL_COMMUNITY): Payer: Medicare Other | Admitting: Anesthesiology

## 2020-02-22 ENCOUNTER — Encounter (HOSPITAL_COMMUNITY): Payer: Self-pay | Admitting: Gastroenterology

## 2020-02-22 ENCOUNTER — Ambulatory Visit (HOSPITAL_COMMUNITY)
Admission: RE | Admit: 2020-02-22 | Discharge: 2020-02-22 | Disposition: A | Discharge status: Home / Self Care | Payer: Medicare Other | Attending: Gastroenterology | Admitting: Gastroenterology

## 2020-02-22 ENCOUNTER — Encounter (HOSPITAL_COMMUNITY)
Admission: RE | Disposition: A | Discharge status: Home / Self Care | Payer: Self-pay | Source: Home / Self Care | Attending: Gastroenterology

## 2020-02-22 ENCOUNTER — Other Ambulatory Visit: Payer: Self-pay

## 2020-02-22 DIAGNOSIS — Z9981 Dependence on supplemental oxygen: Secondary | ICD-10-CM | POA: Insufficient documentation

## 2020-02-22 DIAGNOSIS — Z86010 Personal history of colonic polyps: Secondary | ICD-10-CM | POA: Insufficient documentation

## 2020-02-22 DIAGNOSIS — K648 Other hemorrhoids: Secondary | ICD-10-CM | POA: Diagnosis not present

## 2020-02-22 DIAGNOSIS — G4733 Obstructive sleep apnea (adult) (pediatric): Secondary | ICD-10-CM | POA: Diagnosis not present

## 2020-02-22 DIAGNOSIS — I5032 Chronic diastolic (congestive) heart failure: Secondary | ICD-10-CM | POA: Insufficient documentation

## 2020-02-22 DIAGNOSIS — Z955 Presence of coronary angioplasty implant and graft: Secondary | ICD-10-CM | POA: Insufficient documentation

## 2020-02-22 DIAGNOSIS — I69359 Hemiplegia and hemiparesis following cerebral infarction affecting unspecified side: Secondary | ICD-10-CM | POA: Insufficient documentation

## 2020-02-22 DIAGNOSIS — K635 Polyp of colon: Secondary | ICD-10-CM | POA: Diagnosis not present

## 2020-02-22 DIAGNOSIS — I2510 Atherosclerotic heart disease of native coronary artery without angina pectoris: Secondary | ICD-10-CM | POA: Insufficient documentation

## 2020-02-22 DIAGNOSIS — K644 Residual hemorrhoidal skin tags: Secondary | ICD-10-CM

## 2020-02-22 DIAGNOSIS — I4891 Unspecified atrial fibrillation: Secondary | ICD-10-CM | POA: Diagnosis not present

## 2020-02-22 DIAGNOSIS — Z951 Presence of aortocoronary bypass graft: Secondary | ICD-10-CM | POA: Insufficient documentation

## 2020-02-22 DIAGNOSIS — K649 Unspecified hemorrhoids: Secondary | ICD-10-CM

## 2020-02-22 DIAGNOSIS — Z7901 Long term (current) use of anticoagulants: Secondary | ICD-10-CM | POA: Diagnosis not present

## 2020-02-22 DIAGNOSIS — D122 Benign neoplasm of ascending colon: Secondary | ICD-10-CM | POA: Diagnosis not present

## 2020-02-22 DIAGNOSIS — I868 Varicose veins of other specified sites: Secondary | ICD-10-CM | POA: Diagnosis not present

## 2020-02-22 DIAGNOSIS — Z1211 Encounter for screening for malignant neoplasm of colon: Secondary | ICD-10-CM | POA: Diagnosis not present

## 2020-02-22 DIAGNOSIS — Z860101 Personal history of adenomatous and serrated colon polyps: Secondary | ICD-10-CM

## 2020-02-22 HISTORY — DX: Unspecified hemorrhoids: K64.9

## 2020-02-22 HISTORY — PX: COLONOSCOPY WITH PROPOFOL: SHX5780

## 2020-02-22 HISTORY — DX: Presence of spectacles and contact lenses: Z97.3

## 2020-02-22 HISTORY — PX: POLYPECTOMY: SHX5525

## 2020-02-22 LAB — GLUCOSE, CAPILLARY: Glucose-Capillary: 97 mg/dL (ref 70–99)

## 2020-02-22 SURGERY — COLONOSCOPY WITH PROPOFOL
Anesthesia: Monitor Anesthesia Care

## 2020-02-22 MED ORDER — PROPOFOL 10 MG/ML IV BOLUS
INTRAVENOUS | Status: AC
  Filled 2020-02-22: qty 20

## 2020-02-22 MED ORDER — PROPOFOL 1000 MG/100ML IV EMUL
INTRAVENOUS | Status: AC
  Filled 2020-02-22: qty 200

## 2020-02-22 MED ORDER — LACTATED RINGERS IV SOLN
INTRAVENOUS | Status: DC
  Administered 2020-02-22: 1000 mL via INTRAVENOUS

## 2020-02-22 MED ORDER — SODIUM CHLORIDE 0.9 % IV SOLN: INTRAVENOUS | Status: DC

## 2020-02-22 MED ORDER — PROPOFOL 500 MG/50ML IV EMUL
INTRAVENOUS | Status: DC | PRN
  Administered 2020-02-22: 100 ug/kg/min via INTRAVENOUS

## 2020-02-22 MED ORDER — PROPOFOL 500 MG/50ML IV EMUL
INTRAVENOUS | Status: AC
  Filled 2020-02-22: qty 50

## 2020-02-22 MED ORDER — LACTATED RINGERS IV SOLN: INTRAVENOUS | Status: DC | PRN

## 2020-02-22 MED ORDER — PROPOFOL 10 MG/ML IV BOLUS
INTRAVENOUS | Status: DC | PRN
  Administered 2020-02-22: 50 mg via INTRAVENOUS

## 2020-02-22 SURGICAL SUPPLY — 22 items

## 2020-02-22 NOTE — H&P (Signed)
P  Chief Complaint:    Colon polyp surveillance  HPI:     Patient is a 74 y.o. male presenting to [facility] for colonoscopy for ongoing polyp surveillance. Was last seen by Dr. Fuller Plan on 12/22/2019 with history as below:  This is a 74 year old male with personal history of adenomatous colon polyps and external hemorrhoids.  He has a history of atrial fibrillation on Eliquis coronary artery disease status post CABG in 2004 and DES in 1497, diastolic heart failure, OSA on O2 at night, CVA in 1996 and DM.  On colonoscopy in March 2012 he had one 6 mm tubular adenoma.  A 5-year interval surveillance was recommended which was not done.   He was evaluated by Carl Best, NP in March 2021 for these same reasons.  He has no an external hemorrhoid that is difficult to clean following bowel movements and he would like to have it removed.  He is very frustrated by this problem that has been present for many years and he would like to have his hemorrhoid removed.  He was under the impression that hemorrhoidal banding would be appropriate treatment for his his external hemorrhoid.  Surgical referral for external hemorrhoidectomy was recommended in March 2021 and he was advised to speak with his cardiologist regarding surgical risk and surgical clearance.  He was also recommended to have a colonoscopy.    Review of systems:     No chest pain, no SOB, no fevers, no urinary sx   Past Medical History:  Diagnosis Date  . Acute encephalopathy 12/12/2015  . Acute respiratory failure with hypoxia and hypercapnia ([place]) 12/12/2015  . Allergic rhinitis 02/13/2010   Qualifier: Diagnosis of  By: Wynona Luna   . Angina at rest [facility]) 10/22/2015  . Ataxia 08/10/2013  . Atrial fibrillation ([place]) 11/2018  . B12 deficiency 08/11/2013  . BACK PAIN, LUMBAR 01/29/2009   Qualifier: Diagnosis of  By: Wynona Luna   . BENIGN POSITIONAL VERTIGO 11/23/2009   Qualifier: Diagnosis of  By: Wynona Luna   . Chronic diastolic heart failure ([place]) 04/24/2015  . Chronic pain    left sided-Kristeins  . Coronary 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 ([place]) 11/04/2015   s/p CABG 2004; CFX DES 2011  . Essential hypertension 12/12/2006   Qualifier: Diagnosis of  By: [name] RMA, Lucy    . GERD 12/12/2006   Qualifier: Diagnosis of  By: [name] RMA, Lucy    . GOUT 12/12/2006   Qualifier: Diagnosis of  By: Reatha Armour, Lucy    . Hemorrhoids   . Hyperlipidemia LDL goal <70 12/12/2006   Qualifier: Diagnosis of  By: [name] RMA, Lucy     . Hypogonadism male 04/10/2011  . Hypothyroidism, acquired 02/15/2013  . Insomnia 05/21/2013  . Long term current use of anticoagulant therapy 06/24/2011  . Obstructive sleep apnea-Failed CPAP 06/17/2007   Failed CPAP Using O 2 for sleep   . Paroxysmal atrial fibrillation ([place]) 12/06/2016  . Stroke (cerebrum) ([place]) 01/13/2017   also 1996; resultant hemiplegia of dominant side  . Thoracic aorta atherosclerosis ([place]) 04/27/2017  . Tubular adenoma of colon 05/2010  . Type 2 diabetes mellitus without complication, without long-term current use of insulin ([place]) 10/05/2007   Qualifier: Diagnosis of  By: Wynona Luna   . Vasovagal syncope   . Wears glasses     Patient's surgical history, family medical history, social history, medications  and allergies were all reviewed in Epic    No current facility-administered medications for this encounter.    Physical Exam:     Ht 6\' 3"  (1.905 m)   Wt 108 kg   BMI 29.75 kg/m    IMPRESSION and PLAN:    1) History of adenomatous polyps 2) History of atrial fibrillation 3) Systemic anticoagulation-holding Eliquis 4) CAD, S/P CABG, DES, CHF with EF 55-60%. 5) OSA on O2 at night -Colonoscopy today at [facility] for ongoing polyp surveillance -Plan to resume Eliquis pending colonoscopy findings  6) External hemorrhoids -Has referral in place by Dr. Fuller Plan to colorectal surgery -Evaluate for  concomitant internal hemorrhoids at time of colonoscopy today          Lavena Bullion ,DO, FACG 02/22/2020, 7:54 AM

## 2020-02-22 NOTE — Op Note (Signed)
[facility] Patient Name: [name] Procedure Date: 02/22/2020 MRN: [id number] Attending MD: Gerrit Heck , MD Date of Birth: 1945-09-22 CSN: [id number] Age: 74 Admit Type: Outpatient Procedure:                Colonoscopy Indications:              Surveillance: Personal history of adenomatous                            polyps on last colonoscopy > 5 years ago                           Last colonoscopy was 05/2010 and notable for 6 mm                            tubular adenoma. Providers:                Gerrit Heck, MD, Particia Nearing, RN, Tyrone Apple, Technician, Laureen Abrahams, Stephanie                            [place] CRNA Referring MD:             Pricilla Riffle. Fuller Plan, MD Medicines:                Monitored Anesthesia Care Complications:            No immediate complications. Estimated Blood Loss:     Estimated blood loss was minimal. Procedure:                Pre-Anesthesia Assessment:                           - Prior to the procedure, a History and Physical                            was performed, and patient medications and                            allergies were reviewed. The patient's tolerance of                            previous anesthesia was also reviewed. The risks                            and benefits of the procedure and the sedation                            options and risks were discussed with the patient.                            All questions were answered, and informed consent                            was obtained.  Prior Anticoagulants: The patient has                            taken Eliquis (apixaban), last dose was 2 days                            prior to procedure. ASA Grade Assessment: III - A                            patient with severe systemic disease. After                            reviewing the risks and benefits, the patient was                            deemed in satisfactory  condition to undergo the                            procedure.                           After obtaining informed consent, the colonoscope                            was passed under direct vision. Throughout the                            procedure, the patient's blood pressure, pulse, and                            oxygen saturations were monitored continuously. The                            CF-HQ190L ([id number]) Olympus colonoscope was                            introduced through the anus and advanced to the the                            cecum, identified by appendiceal orifice and                            ileocecal valve. The colonoscopy was performed                            without difficulty. The patient tolerated the                            procedure well. The quality of the bowel                            preparation was good. The ileocecal valve,  appendiceal orifice, and rectum were photographed. Scope In: 8:45:38 AM Scope Out: 9:02:09 AM Scope Withdrawal Time: 0 hours 12 minutes 59 seconds  Total Procedure Duration: 0 hours 16 minutes 31 seconds  Findings:      Skin tags were found on perianal exam.      A 3 mm polyp was found in the ascending colon. The polyp was sessile.       The polyp was removed with a cold snare. Resection and retrieval were       complete. Estimated blood loss was minimal.      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      Non-bleeding internal hemorrhoids were found during retroflexion. The       hemorrhoids were small. Impression:               - Perianal skin tags found on perianal exam.                           - One 3 mm polyp in the ascending colon, removed                            with a cold snare. Resected and retrieved.                           - Rectal varices.                           - Non-bleeding internal hemorrhoids. Moderate Sedation:      Not Applicable - Patient had care per  Anesthesia. Recommendation:           - Patient has a contact number available for                            emergencies. The signs and symptoms of potential                            delayed complications were discussed with the                            patient. Return to normal activities tomorrow.                            Written discharge instructions were provided to the                            patient.                           - Resume previous diet.                           - Continue present medications.                           - Await pathology results.                           - Repeat colonoscopy for surveillance based  on                            pathology results.                           - Referral already in place for evaluation in the                            [facility] at appointment to be                            scheduled.                           - Can follow-up in the Gastroenterology Clinic with                            Dr. Fuller Plan.                           Madaline Brilliant to resume Eliquis tomorrow. Procedure Code(s):        --- Professional ---                           [phone], Colonoscopy, flexible; with removal of                            tumor(s), polyp(s), or other lesion(s) by snare                            technique Diagnosis Code(s):        --- Professional ---                           K64.8, Other hemorrhoids                           Z86.010, Personal history of colonic polyps                           K63.5, Polyp of colon                           K64.4, Residual hemorrhoidal skin tags CPT copyright 2019 [employer]. All rights reserved. The codes documented in this report are preliminary and upon coder review may  be revised to meet current compliance requirements. Gerrit Heck, MD 02/22/2020 9:11:23 AM Number of Addenda: 0

## 2020-02-22 NOTE — Anesthesia Postprocedure Evaluation (Signed)
Anesthesia Post Note  Patient: [name]  Procedure(s) Performed: COLONOSCOPY WITH PROPOFOL (N/A ) POLYPECTOMY     Patient location during evaluation: Endoscopy Anesthesia Type: MAC Level of consciousness: awake and alert Pain management: pain level controlled Vital Signs Assessment: post-procedure vital signs reviewed and stable Respiratory status: spontaneous breathing, nonlabored ventilation, respiratory function stable and patient connected to nasal cannula oxygen Cardiovascular status: blood pressure returned to baseline and stable Postop Assessment: no apparent nausea or vomiting Anesthetic complications: no   No complications documented.  Last Vitals:  Vitals:   02/22/20 0920 02/22/20 0930  BP: (!) 152/68   Pulse: (!) 59 65  Resp: 13 14  Temp:    SpO2: 94% 94%    Last Pain:  Vitals:   02/22/20 0930  TempSrc:   PainSc: 0-No pain                 Mykeal Carrick L Korbin Notaro

## 2020-02-22 NOTE — Discharge Instructions (Signed)
YOU HAD AN ENDOSCOPIC PROCEDURE TODAY: Refer to the procedure report and other information in the discharge instructions given to you for any specific questions about what was found during the examination. If this information does not answer your questions, please call [place] office at [phone] to clarify. YOU HAD AN ENDOSCOPIC PROCEDURE TODAY: Refer to the procedure report and other information in the discharge instructions given to you for any specific questions about what was found during the examination. If this information does not answer your questions, please call [place] office at [phone] to clarify.   YOU SHOULD EXPECT: Some feelings of bloating in the abdomen. Passage of more gas than usual. Walking can help get rid of the air that was put into your GI tract during the procedure and reduce the bloating. If you had a lower endoscopy (such as a colonoscopy or flexible sigmoidoscopy) you may notice spotting of blood in your stool or on the toilet paper. Some abdominal soreness may be present for a day or two, also.  DIET: Your first meal following the procedure should be a light meal and then it is ok to progress to your normal diet. A half-sandwich or bowl of soup is an example of a good first meal. Heavy or fried foods are harder to digest and may make you feel nauseous or bloated. Drink plenty of fluids but you should avoid alcoholic beverages for 24 hours. If you had a esophageal dilation, please see attached instructions for diet.    ACTIVITY: Your care partner should take you home directly after the procedure. You should plan to take it easy, moving slowly for the rest of the day. You can resume normal activity the day after the procedure however YOU SHOULD NOT DRIVE, use power tools, machinery or perform tasks that involve climbing or major physical exertion for 24 hours (because of the sedation medicines used during the test).   SYMPTOMS TO REPORT IMMEDIATELY: A gastroenterologist  can be reached at any hour. Please call [phone]  for any of the following symptoms:  . Following lower endoscopy (colonoscopy, flexible sigmoidoscopy) Excessive amounts of blood in the stool  Significant tenderness, worsening of abdominal pains  Swelling of the abdomen that is new, acute  Fever of 100 or higher  . Following upper endoscopy (EGD, EUS, ERCP, esophageal dilation) Vomiting of blood or coffee ground material  New, significant abdominal pain  New, significant chest pain or pain under the shoulder blades  Painful or persistently difficult swallowing  New shortness of breath  Black, tarry-looking or red, bloody stools  FOLLOW UP:  If any biopsies were taken you will be contacted by phone or by letter within the next 1-3 weeks. Call [phone]  if you have not heard about the biopsies in 3 weeks.  Please also call with any specific questions about appointments or follow up tests.  YOU SHOULD EXPECT: Some feelings of bloating in the abdomen. Passage of more gas than usual. Walking can help get rid of the air that was put into your GI tract during the procedure and reduce the bloating. If you had a lower endoscopy (such as a colonoscopy or flexible sigmoidoscopy) you may notice spotting of blood in your stool or on the toilet paper. Some abdominal soreness may be present for a day or two, also.  DIET: Your first meal following the procedure should be a light meal and then it is ok to progress to your normal diet. A half-sandwich or bowl of soup is an example  of a good first meal. Heavy or fried foods are harder to digest and may make you feel nauseous or bloated. Drink plenty of fluids but you should avoid alcoholic beverages for 24 hours. If you had a esophageal dilation, please see attached instructions for diet.    ACTIVITY: Your care partner should take you home directly after the procedure. You should plan to take it easy, moving slowly for the rest of the day. You can resume  normal activity the day after the procedure however YOU SHOULD NOT DRIVE, use power tools, machinery or perform tasks that involve climbing or major physical exertion for 24 hours (because of the sedation medicines used during the test).   SYMPTOMS TO REPORT IMMEDIATELY: A gastroenterologist can be reached at any hour. Please call [phone]  for any of the following symptoms:  . Following lower endoscopy (colonoscopy, flexible sigmoidoscopy) Excessive amounts of blood in the stool  Significant tenderness, worsening of abdominal pains  Swelling of the abdomen that is new, acute  Fever of 100 or higher  . Following upper endoscopy (EGD, EUS, ERCP, esophageal dilation) Vomiting of blood or coffee ground material  New, significant abdominal pain  New, significant chest pain or pain under the shoulder blades  Painful or persistently difficult swallowing  New shortness of breath  Black, tarry-looking or red, bloody stools  FOLLOW UP:  If any biopsies were taken you will be contacted by phone or by letter within the next 1-3 weeks. Call [phone]  if you have not heard about the biopsies in 3 weeks.  Please also call with any specific questions about appointments or follow up tests. Resume Eliquis tomorrow per Dr Bryan Lemma

## 2020-02-22 NOTE — Interval H&P Note (Signed)
History and Physical Interval Note:  02/22/2020 8:03 AM  [name]  has presented today for surgery, with the diagnosis of Hx of polyps, hemorrhoids.  The various methods of treatment have been discussed with the patient and family. After consideration of risks, benefits and other options for treatment, the patient has consented to  Procedure(s): COLONOSCOPY WITH PROPOFOL (N/A) as a surgical intervention.  The patient's history has been reviewed, patient examined, no change in status, stable for surgery.  I have reviewed the patient's chart and labs.  Questions were answered to the patient's satisfaction.     [name] [name]

## 2020-02-22 NOTE — Transfer of Care (Signed)
Immediate Anesthesia Transfer of Care Note  Patient: [name]  Procedure(s) Performed: COLONOSCOPY WITH PROPOFOL (N/A ) POLYPECTOMY  Patient Location: endo pacu  Anesthesia Type:MAC  Level of Consciousness: awake and drowsy  Airway & Oxygen Therapy: Patient Spontanous Breathing  Post-op Assessment: Report given to RN and Post -op Vital signs reviewed and stable  Post vital signs: Reviewed and stable  Last Vitals:  Vitals Value Taken Time  BP 117/71 02/22/20 0908  Temp    Pulse 57 02/22/20 0908  Resp 14 02/22/20 0908  SpO2 95 % 02/22/20 0908  Vitals shown include unvalidated device data.  Last Pain: There were no vitals filed for this visit.       Complications: No complications documented.

## 2020-02-23 ENCOUNTER — Encounter: Payer: Self-pay | Admitting: Gastroenterology

## 2020-02-23 LAB — SURGICAL PATHOLOGY

## 2020-02-24 ENCOUNTER — Encounter (HOSPITAL_COMMUNITY): Payer: Self-pay | Admitting: Gastroenterology

## 2020-03-01 DIAGNOSIS — Z23 Encounter for immunization: Secondary | ICD-10-CM | POA: Diagnosis not present

## 2020-03-03 ENCOUNTER — Other Ambulatory Visit: Payer: Self-pay | Admitting: Physician Assistant

## 2020-03-14 ENCOUNTER — Other Ambulatory Visit: Payer: Self-pay | Admitting: Physical Medicine & Rehabilitation

## 2020-03-24 ENCOUNTER — Other Ambulatory Visit: Payer: Self-pay | Admitting: Family

## 2020-03-25 ENCOUNTER — Other Ambulatory Visit: Payer: Self-pay | Admitting: Physician Assistant

## 2020-04-13 ENCOUNTER — Other Ambulatory Visit: Payer: Self-pay | Admitting: Family

## 2020-04-17 DIAGNOSIS — M1811 Unilateral primary osteoarthritis of first carpometacarpal joint, right hand: Secondary | ICD-10-CM | POA: Diagnosis not present

## 2020-04-17 DIAGNOSIS — M19041 Primary osteoarthritis, right hand: Secondary | ICD-10-CM | POA: Diagnosis not present

## 2020-04-19 ENCOUNTER — Other Ambulatory Visit: Payer: Self-pay

## 2020-04-19 ENCOUNTER — Encounter: Payer: Medicare Other | Attending: Physical Medicine & Rehabilitation | Admitting: Physical Medicine & Rehabilitation

## 2020-04-19 ENCOUNTER — Encounter: Payer: Self-pay | Admitting: Physical Medicine & Rehabilitation

## 2020-04-19 VITALS — BP 129/76 | HR 64 | Temp 98.6°F | Ht 75.0 in | Wt 242.0 lb

## 2020-04-19 DIAGNOSIS — G8929 Other chronic pain: Secondary | ICD-10-CM | POA: Insufficient documentation

## 2020-04-19 DIAGNOSIS — R252 Cramp and spasm: Secondary | ICD-10-CM | POA: Insufficient documentation

## 2020-04-19 DIAGNOSIS — M25562 Pain in left knee: Secondary | ICD-10-CM | POA: Diagnosis not present

## 2020-04-19 NOTE — Progress Notes (Signed)
Botox Injection for spasticity using needle EMG guidance °  °Dilution: 50 Units/ml °Indication: Severe spasticity which interferes with ADL,mobility and/or  hygiene and is unresponsive to medication management and other conservative care °Informed consent was obtained after describing risks and benefits of the procedure with the patient. This includes bleeding, bruising, infection, excessive weakness, or medication side effects. A REMS form is on file and signed. °Needle: 27g 1" needle electrode °Number of units per muscle °  ° left FDP 25 units ° left FDS 25 units °left biceps 50 units °[id number] °All injections were done after obtaining appropriate EMG activity and after negative drawback for blood. The patient tolerated the procedure well. Post procedure instructions were given. A followup appointment was made.     °

## 2020-04-19 NOTE — Patient Instructions (Signed)
You received a Botox injection today. You may experience soreness at the needle injection sites. Please call us if any of the injection sites turns red after a couple days or if there is any drainage. You may experience muscle weakness as a result of Botox. This would improve with time but can take several weeks to improve. The Botox should start working in about one week. The Botox usually last 3 months. The injection can be repeated every 3 months as needed.  VOLTAREN GEL OVER THE COUNTER 3-4 TIMES A DAY TO LEFT KNEE CAN ALSO USE ON YOUR RIGHT HAND

## 2020-05-04 ENCOUNTER — Encounter: Payer: Self-pay | Admitting: Family

## 2020-05-04 ENCOUNTER — Other Ambulatory Visit: Payer: Self-pay

## 2020-05-04 ENCOUNTER — Telehealth: Payer: Self-pay | Admitting: Family

## 2020-05-04 ENCOUNTER — Ambulatory Visit (INDEPENDENT_AMBULATORY_CARE_PROVIDER_SITE_OTHER): Payer: Medicare Other | Admitting: Family

## 2020-05-04 VITALS — BP 139/70 | HR 69 | Temp 98.6°F | Resp 16 | Ht 75.0 in | Wt 244.6 lb

## 2020-05-04 DIAGNOSIS — E785 Hyperlipidemia, unspecified: Secondary | ICD-10-CM

## 2020-05-04 DIAGNOSIS — Z8639 Personal history of other endocrine, nutritional and metabolic disease: Secondary | ICD-10-CM

## 2020-05-04 DIAGNOSIS — M79672 Pain in left foot: Secondary | ICD-10-CM

## 2020-05-04 DIAGNOSIS — E1169 Type 2 diabetes mellitus with other specified complication: Secondary | ICD-10-CM | POA: Diagnosis not present

## 2020-05-04 DIAGNOSIS — E119 Type 2 diabetes mellitus without complications: Secondary | ICD-10-CM

## 2020-05-04 DIAGNOSIS — M109 Gout, unspecified: Secondary | ICD-10-CM | POA: Diagnosis not present

## 2020-05-04 DIAGNOSIS — E7800 Pure hypercholesterolemia, unspecified: Secondary | ICD-10-CM

## 2020-05-04 DIAGNOSIS — E118 Type 2 diabetes mellitus with unspecified complications: Secondary | ICD-10-CM

## 2020-05-04 DIAGNOSIS — D509 Iron deficiency anemia, unspecified: Secondary | ICD-10-CM

## 2020-05-04 LAB — FERRITIN: Ferritin: 204.4 ng/mL (ref 22.0–322.0)

## 2020-05-04 LAB — CBC WITH DIFFERENTIAL/PLATELET
Basophils Absolute: 0.1 10*3/uL (ref 0.0–0.1)
Basophils Relative: 1.1 % (ref 0.0–3.0)
Eosinophils Absolute: 0.3 10*3/uL (ref 0.0–0.7)
Eosinophils Relative: 4.7 % (ref 0.0–5.0)
HCT: 41.5 % (ref 39.0–52.0)
Hemoglobin: 13.9 g/dL (ref 13.0–17.0)
Lymphocytes Relative: 30.8 % (ref 12.0–46.0)
Lymphs Abs: 2.1 10*3/uL (ref 0.7–4.0)
MCHC: 33.4 g/dL (ref 30.0–36.0)
MCV: 93.2 fl (ref 78.0–100.0)
Monocytes Absolute: 0.5 10*3/uL (ref 0.1–1.0)
Monocytes Relative: 7.8 % (ref 3.0–12.0)
Neutro Abs: 3.8 10*3/uL (ref 1.4–7.7)
Neutrophils Relative %: 55.6 % (ref 43.0–77.0)
Platelets: 156 10*3/uL (ref 150.0–400.0)
RBC: 4.45 Mil/uL (ref 4.22–5.81)
RDW: 14.5 % (ref 11.5–15.5)
WBC: 6.8 10*3/uL (ref 4.0–10.5)

## 2020-05-04 LAB — LIPID PANEL
Cholesterol: 151 mg/dL (ref 0–200)
HDL: 50.7 mg/dL (ref >39.00)
LDL Cholesterol: 75 mg/dL (ref 0–99)
NonHDL: 100.1
Total CHOL/HDL Ratio: 3
Triglycerides: 125 mg/dL (ref 0.0–149.0)
VLDL: 25 mg/dL (ref 0.0–40.0)

## 2020-05-04 LAB — COMPREHENSIVE METABOLIC PANEL
ALT: 24 U/L (ref 0–53)
AST: 25 U/L (ref 0–37)
Albumin: 4.3 g/dL (ref 3.5–5.2)
Alkaline Phosphatase: 56 U/L (ref 39–117)
BUN: 25 mg/dL — ABNORMAL HIGH (ref 6–23)
CO2: 34 mEq/L — ABNORMAL HIGH (ref 19–32)
Calcium: 9.6 mg/dL (ref 8.4–10.5)
Chloride: 101 mEq/L (ref 96–112)
Creatinine, Ser: 1.18 mg/dL (ref 0.40–1.50)
GFR: 59.74 mL/min — ABNORMAL LOW (ref >60.00)
Glucose, Bld: 108 mg/dL — ABNORMAL HIGH (ref 70–99)
Potassium: 4.4 mEq/L (ref 3.5–5.1)
Sodium: 141 mEq/L (ref 135–145)
Total Bilirubin: 0.6 mg/dL (ref 0.2–1.2)
Total Protein: 6.9 g/dL (ref 6.0–8.3)

## 2020-05-04 LAB — HEMOGLOBIN A1C: Hgb A1c MFr Bld: 5.9 % (ref 4.6–6.5)

## 2020-05-04 LAB — TSH: TSH: 1.82 u[IU]/mL (ref 0.35–4.50)

## 2020-05-04 LAB — IRON: Iron: 118 ug/dL (ref 42–165)

## 2020-05-04 LAB — URIC ACID: Uric Acid, Serum: 5.1 mg/dL (ref 4.0–7.8)

## 2020-05-04 NOTE — Telephone Encounter (Signed)
Patient advised of results and provider's advise. He verbalized understanding and will cal by the end of next week if no better.

## 2020-05-04 NOTE — Telephone Encounter (Signed)
Please advise pt that I reviewed his labs. His uric acid is normal, so do not think his pain is related to gout.  I would still recommend tylenol, icing.  If symptoms do not improve in the next few weeks, then he should let me know and I can refer him to sports medicine.   Blood count, iron level, kidney function all look good.

## 2020-05-04 NOTE — Progress Notes (Signed)
Subjective:    Patient ID: [name], male    DOB: 1945-03-23, 75 y.o.   MRN: [id number]  HPI  Patient is a 75 yr old male who presents today with chief complaint of left foot pain. Notes that pain began a few weeks ago. Pain is located in the arch and at the base of the left great toe. Reports compliance with allopurinol.  Denies injury.  Takes tramadol for chronic pain. Requesting rx for pain.   DM2- Reports home sugars have been "fine."  Running 99-126. maintainded on metformin 540m once daily.  Reports that he uses gabapentin HS.   Lab Results  Component Value Date   HGBA1C 6.2 01/24/2020   HGBA1C 5.9 05/25/2019   HGBA1C 6.9 (H) 11/28/2018   Lab Results  Component Value Date   MICROALBUR <0.7 09/26/2014   LDLCALC 66 01/24/2020   CREATININE 1.15 01/24/2020   HTN- on diltiazem 1870m lasix 4079mlotensin 59m89mBP Readings from Last 3 Encounters:  05/04/20 139/70  04/19/20 129/76  02/22/20 (!) 152/68   Gout- Currently on allopurinol 200mg16me daily. Reports that this started a few weeks ago.    He has some arthritis at the base of the right thumb.   GERD- maintaned on protonix 40mg 32m daily. Denies gerd symptoms.  Hyperlipidemia- maintained on atorvastatin 40 mg once daily.  Lab Results  Component Value Date   CHOL 135 01/24/2020   HDL 44.80 01/24/2020   LDLCALC 66 01/24/2020   TRIG 120.0 01/24/2020   CHOLHDL 3 01/24/2020   Hypothyroid- maintained on synthroid 112 mcg.  Reports energy has been stable.   Lab Results  Component Value Date   TSH 1.55 05/25/2019   Iron deficiency anemia- maintained on iron 325mg o64mdaily.  Lab Results  Component Value Date   WBC 7.4 01/24/2020   HGB 13.3 01/24/2020   HCT 40.2 01/24/2020   MCV 94.1 01/24/2020   PLT 161.0 01/24/2020      Review of Systems See HPI  Past Medical History:  Diagnosis Date  . Acute encephalopathy 12/12/2015  . Acute respiratory failure with hypoxia and hypercapnia (HCC)  9[place]/2017  . Allergic rhinitis 02/13/2010   Qualifier: Diagnosis of  By: Yoo DO,Wynona Lunagina at rest (HCC) 8[facility]017  . Ataxia 08/10/2013  . Atrial fibrillation (HCC) 09[place]20  . B12 deficiency 08/11/2013  . BACK PAIN, LUMBAR 01/29/2009   Qualifier: Diagnosis of  By: Yoo DO,Wynona LunaNIGN POSITIONAL VERTIGO 11/23/2009   Qualifier: Diagnosis of  By: Yoo DO,Wynona Lunaronic diastolic heart failure (HCC) 2/[place]017  . Chronic pain    left sided-[place]  . 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 Essential hypertension 12/12/2006   Qualifier: Diagnosis of  By: Brand RMarca [place]    . GERD 12/12/2006   Qualifier: Diagnosis of  By: Brand RMarca [place]    . GOUT 12/12/2006   Qualifier: Diagnosis of  By: Brand RReatha Armour   . Hemorrhoids   . Hyperlipidemia LDL goal <70 12/12/2006   Qualifier: Diagnosis of  By: Brand RMarca [place]cy     . Hypogonadism male 04/10/2011  . Hypothyroidism, acquired 02/15/2013  . Insomnia 05/21/2013  . Long term current use of anticoagulant therapy 06/24/2011  . Obstructive sleep apnea-Failed CPAP 06/17/2007   Failed CPAP Using O 2 for sleep   . Paroxysmal  atrial fibrillation ([place]) 12/06/2016  . 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 Thoracic aorta atherosclerosis ([place]) 04/27/2017  . Tubular adenoma of colon 05/2010  . Type 2 diabetes mellitus without complication, without long-term current use of insulin ([place]) 10/05/2007   Qualifier: Diagnosis of  By: Wynona Luna   . Vasovagal syncope   . Wears glasses      Social History   Socioeconomic History  . Marital status: Married    Spouse name: [name]  . Number of children: Not on file  . Years of education: 47  . Highest education level: Not on file  Occupational History  . 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 Smoking status: Never  Smoker  . Smokeless tobacco: Never Used  Vaping Use  . Vaping Use: Never used  Substance and Sexual Activity  . Alcohol use: Not Currently  . Drug use: No  . Sexual activity: Not Currently  Other Topics Concern  . Not on file  Social History Narrative   Pt lives with wife. Does have stairs, but patient doesn't use them. Pt has completed technical school   Social Determinants of Health   Financial Resource Strain: Not on file  Food Insecurity: Not on file  Transportation Needs: Not on file  Physical Activity: Not on file  Stress: Not on file  Social Connections: Not on file  Intimate Partner Violence: Not on file    Past Surgical History:  Procedure Laterality Date  . CARDIAC CATHETERIZATION N/A 10/25/2015   Procedure: Left Heart Cath and Cors/Grafts Angiography;  Surgeon: Troy Sine, MD;  Location: [place] [facility];  Service: Cardiovascular;  Laterality: N/A;  . CARDIOVERSION  06/20/2011   Procedure: CARDIOVERSION;  Surgeon: Larey Dresser, MD;  Location: [facility] ENDOSCOPY;  Service: Cardiovascular;  Laterality: N/A;  . CARDIOVERSION N/A 04/26/2015   Procedure: CARDIOVERSION;  Surgeon: Dorothy Spark, MD;  Location: [facility] ENDOSCOPY;  Service: Cardiovascular;  Laterality: N/A;  . CARDIOVERSION N/A 05/10/2015   Procedure: CARDIOVERSION;  Surgeon: Larey Dresser, MD;  Location: [place];  Service: Cardiovascular;  Laterality: N/A;  . CARDIOVERSION N/A 12/10/2018   Procedure: CARDIOVERSION;  Surgeon: Sanda Klein, MD;  Location: [place];  Service: Cardiovascular;  Laterality: N/A;  . CATARACT EXTRACTION  05/2010   left eye  . CATARACT EXTRACTION  04/2010   right eye  . COLONOSCOPY WITH PROPOFOL N/A 02/22/2020   Procedure: COLONOSCOPY WITH PROPOFOL;  Surgeon: Lavena Bullion, DO;  Location: [facility] ENDOSCOPY;  Service: Gastroenterology;  Laterality: N/A;  . CORONARY ARTERY BYPASS GRAFT     stent  . ESOPHAGOGASTRODUODENOSCOPY  03-25-2005  . LEFT HEART CATH AND CORS/GRAFTS  ANGIOGRAPHY N/A 02/19/2018   Procedure: LEFT HEART CATH AND CORS/GRAFTS ANGIOGRAPHY;  Surgeon: Martinique, Peter M, MD;  Location: [place] [facility];  Service: Cardiovascular;  Laterality: N/A;  . NASAL SEPTUM SURGERY    . PERCUTANEOUS PLACEMENT INTRAVASCULAR STENT CERVICAL CAROTID ARTERY     03-2009; using a drug-eluting platform of the circumflex cornoray artery with a 3.0 x 18 [employer] Promus drug-eluting platform post dilated to 3.75 with a noncompliant balloon.  Marland Kitchen POLYPECTOMY  02/22/2020   Procedure: POLYPECTOMY;  Surgeon: Lavena Bullion, DO;  Location: [facility] ENDOSCOPY;  Service: Gastroenterology;;  . TEE WITHOUT CARDIOVERSION  06/20/2011   Procedure: TRANSESOPHAGEAL ECHOCARDIOGRAM (TEE);  Surgeon: Larey Dresser, MD;  Location: [place];  Service: Cardiovascular;  Laterality: N/A;    Family History  Problem Relation Age of Onset  . Lung cancer Father        deceased  . Stroke Mother        deceased-[place]ISTROKES  . Colon cancer Neg Hx   . Esophageal cancer Neg Hx   . Stomach cancer Neg Hx   . Pancreatic cancer Neg Hx   . Liver disease Neg Hx     No Known Allergies  Current Outpatient Medications on File Prior to Visit  Medication Sig Dispense Refill  . Accu-Chek FastClix Lancets MISC USE TO TEST 3 TIMES DAILY 102 each 0  . allopurinol (ZYLOPRIM) 100 MG tablet TAKE 2 TABLETS BY MOUTH EVERY DAY (Patient taking differently: Take 200 mg by mouth daily.) 180 tablet 1  . atorvastatin (LIPITOR) 40 MG tablet Take 1 tablet (40 mg total) by mouth daily. 90 tablet 1  . benazepril (LOTENSIN) 10 MG tablet Take 1 tablet (10 mg total) by mouth daily. Take with additional 56m tablet of benazepril daily 90 tablet 1  . benazepril (LOTENSIN) 5 MG tablet TAKE 1 TABLET BY MOUTH DAILY WITH 10MG OF BENAZEPRIL TO EQUAL 15MG DAILY 90 tablet 1  . blood glucose meter kit and supplies KIT Dispense based on patient and insurance preference. Use up to four times daily as directed. (FOR ICD-9 250.00,  250.01). 102 each 0  . cyanocobalamin 1000 MCG tablet Take 2,000 mcg by mouth daily. Take 2 Gummies daily    . diclofenac Sodium (VOLTAREN) 1 % GEL USE AS DIRECTED (Patient taking differently: Apply 1 application topically 4 (four) times daily as needed (pain.).) 100 g 6  . diltiazem (CARDIZEM CD) 180 MG 24 hr capsule TAKE 1 CAPSULE BY MOUTH EVERY DAY (Patient taking differently: Take 180 mg by mouth daily.) 90 capsule 2  . dofetilide (TIKOSYN) 500 MCG capsule TAKE 1 CAPSULE TWICE A DAY 180 capsule 1  . ELIQUIS 5 MG TABS tablet TAKE 1 TABLET BY MOUTH TWICE A DAY (Patient taking differently: Take 5 mg by mouth 2 (two) times daily.) 180 tablet 1  . ferrous sulfate 325 (65 FE) MG tablet Take 1 tablet (325 mg total) by mouth 2 (two) times daily with a meal. (Patient taking differently: Take 650 mg by mouth every evening.) 180 tablet 1  . fluticasone (FLONASE) 50 MCG/ACT nasal spray Place 2 sprays into both nostrils as needed for allergies or rhinitis. 16 g 0  . folic acid (FOLVITE) 1 MG tablet TAKE 1 TABLET BY MOUTH EVERY DAY 90 tablet 0  . furosemide (LASIX) 40 MG tablet TAKE 1 TABLET BY MOUTH DAILY AS NEEDED FOR EDEMA (EDEMA AND WEIGHT GAIN). (Patient taking differently: Take 40 mg by mouth daily as needed (fluid retention/swelling/weight gain.).) 90 tablet 1  . gabapentin (NEURONTIN) 100 MG capsule TAKE 1 CAPSULE BY MOUTH AT BEDTIME. (Patient taking differently: Take 100 mg by mouth at bedtime.) 90 capsule 1  . glucose blood (ACCU-CHEK GUIDE) test strip USE AS DIRECTED UP TO 4 TIMES DAILY 100 strip 0  . levothyroxine (SYNTHROID) 112 MCG tablet Take 1 tablet (112 mcg total) by mouth daily before breakfast. 90 tablet 1  . metFORMIN (GLUCOPHAGE) 500 MG tablet Take 1 tablet (500 mg total) by mouth daily with breakfast. 90 tablet 1  . metoprolol succinate (TOPROL-XL) 25 MG 24 hr tablet TAKE 1 TABLET BY MOUTH EVERY DAY (Patient taking differently: Take 25 mg by mouth daily.) 90 tablet 2  . Multiple  Vitamins-Minerals (MULTIVITAMIN GUMMIES  MENS PO) Take 2 tablets by mouth daily with breakfast.     . nitroGLYCERIN (NITROSTAT) 0.4 MG SL tablet Place 1 tablet (0.4 mg total) under the tongue every 5 (five) minutes as needed for chest pain. Up to 3 doses 10 tablet 2  . OXYGEN Inhale 2 L into the lungs at bedtime.    . pantoprazole (PROTONIX) 40 MG tablet TAKE 1 TABLET BY MOUTH EVERY DAY IN THE MORNING (Patient taking differently: Take 40 mg by mouth daily.) 90 tablet 0  . sennosides-docusate sodium (SENOKOT-S) 8.6-50 MG tablet Take 1 tablet by mouth as needed for constipation.    Manus Gunning BOWEL PREP KIT 17.5-3.13-1.6 GM/177ML SOLN SMARTSIG:354 Milliliter(s) By Mouth As Directed    . traMADol (ULTRAM) 50 MG tablet TAKE 1 TABLET BY MOUTH TWICE A DAY 180 tablet 0  . traMADol (ULTRAM-ER) 200 MG 24 hr tablet TAKE 1 TABLET BY MOUTH EVERY DAY 90 tablet 0   No current facility-administered medications on file prior to visit.    BP 139/70 (BP Location: Right Arm, Patient Position: Sitting, Cuff Size: Large)   Pulse 69   Temp 98.6 F (37 C) (Oral)   Resp 16   Ht 6' 3"  (1.905 m)   Wt 244 lb 9.6 oz (110.9 kg)   SpO2 98%   BMI 30.57 kg/m       Objective:   Physical Exam Constitutional:      General: He is not in acute distress.    Appearance: He is well-developed and well-nourished.  HENT:     Head: Normocephalic and atraumatic.  Cardiovascular:     Rate and Rhythm: Normal rate and regular rhythm.     Heart sounds: No murmur heard.     Comments: Trace bilateral pedal edema.  Pulmonary:     Effort: Pulmonary effort is normal. No respiratory distress.     Breath sounds: Normal breath sounds. No wheezing or rales.  Musculoskeletal:        General: No edema.  Skin:    General: Skin is warm and dry.  Neurological:     Mental Status: He is alert and oriented to person, place, and time.  Psychiatric:        Mood and Affect: Mood and affect normal.        Behavior: Behavior normal.         Thought Content: Thought content normal.           Assessment & Plan:  Left foot pain- ? Arthritis vs. Gout vs plantar fasciitis. Recommended that we check uric acid level. If high will rx with colchicine.  Advised pt use tylenol and ice foot bid.  Need to avoid nsaids due to pt's use of Eliquis.  Iron deficiency anemia- continue iron 349m once daily. Check CBC, serum iron, ferritin.    DM2- clinically stable. Obtain follow up A1C.  Hyperlipidemia- tolerating atorvastatin 420m Continue same. Obtain follow up lipid panel.   Gout- continue allopurinol 2009mnce daily, check follow up uric acid level.   GERD-stable, continue protonix 63m67mce daily.   Hypothyroid- stable. Continue synthroid 112mc69md repeat tsh.  This visit occurred during the SARS-CoV-2 public health emergency.  Safety protocols were in place, including screening questions prior to the visit, additional usage of staff PPE, and extensive cleaning of exam room while observing appropriate contact time as indicated for disinfecting solutions.

## 2020-05-04 NOTE — Patient Instructions (Addendum)
Please complete lab work prior to leaving. For pain- you may use tylenol twice daily. Ice your left foot twice daily.

## 2020-05-07 ENCOUNTER — Telehealth: Payer: Medicare Other | Admitting: Family

## 2020-05-07 ENCOUNTER — Other Ambulatory Visit: Payer: Self-pay | Admitting: Family

## 2020-05-17 ENCOUNTER — Encounter: Payer: Medicare Other | Attending: Physical Medicine & Rehabilitation | Admitting: Physical Medicine & Rehabilitation

## 2020-05-17 ENCOUNTER — Other Ambulatory Visit: Payer: Self-pay

## 2020-05-17 ENCOUNTER — Encounter: Payer: Self-pay | Admitting: Physical Medicine & Rehabilitation

## 2020-05-17 VITALS — BP 127/79 | HR 67 | Temp 98.3°F | Ht 73.0 in | Wt 244.6 lb

## 2020-05-17 DIAGNOSIS — M25562 Pain in left knee: Secondary | ICD-10-CM | POA: Diagnosis not present

## 2020-05-17 DIAGNOSIS — G8112 Spastic hemiplegia affecting left dominant side: Secondary | ICD-10-CM | POA: Insufficient documentation

## 2020-05-17 DIAGNOSIS — Z5181 Encounter for therapeutic drug level monitoring: Secondary | ICD-10-CM | POA: Insufficient documentation

## 2020-05-17 DIAGNOSIS — M79672 Pain in left foot: Secondary | ICD-10-CM | POA: Diagnosis not present

## 2020-05-17 DIAGNOSIS — G894 Chronic pain syndrome: Secondary | ICD-10-CM | POA: Insufficient documentation

## 2020-05-17 DIAGNOSIS — G8929 Other chronic pain: Secondary | ICD-10-CM | POA: Insufficient documentation

## 2020-05-17 DIAGNOSIS — Z79891 Long term (current) use of opiate analgesic: Secondary | ICD-10-CM | POA: Diagnosis not present

## 2020-05-17 NOTE — Progress Notes (Signed)
Subjective:    Patient ID: [name], male    DOB: [birth year], 75 y.o.   MRN: [id number]  HPI LLE burning sensation starts in bottom of foot and goes up to Left knee.  He has a history of CVA with chronic left spastic hemiplegia and has been getting botulinum toxin injections to the left hamstring as well as left upper extremity for several years.  His last injection was performed 1 month ago.  At that time he had some left knee pain but no symptoms in his foot.  He was advised to start some Voltaren gel.  In the interval time he has developed pain on the plantar surface of his left foot this goes up to his left knee.  He has chronic swelling in the left lower extremity, he wears compression hose bilaterally.  He has a history of coronary artery disease status post CABG.  He is a non-smoker.  He has a history of atrial fibrillation and has been on chronic Eliquis. In addition he has a history of lumbar spinal stenosis.  Last lumbar MRI in 2017 showed moderately severe stenosis of the central canal at L3-L4 as well as multilevel degenerative changes in the lower lumbar levels. Lower extremity venous duplex Doppler 11/27/2018 was normal Is on Eliquis advised not to stop per PCP.  He was seen by primary care provider for the same symptoms 05/04/2020, uric acid level was checked and was normal.  C-Met was normal, triglycerides and cholesterol were normal, hemoglobin A1c was 5.9.  CBC and platelets were normal Pain Inventory Average Pain 9 Pain Right Now 9 My pain is constant, burning and when leg is down or he is up on it.  No pain when elevated  In the last 24 hours, has pain interfered with the following? General activity 10 Relation with others 10 Enjoyment of life 10 What TIME of day is your pain at its worst? morning  Sleep (in general) Poor  Pain is worse with: walking, standing and being up on the leg Pain improves with: elevation Relief from Meds: 0  Family History  Problem  Relation Age of Onset  . Lung cancer Father        deceased  . Stroke Mother        deceased-MINISTROKES  . Colon cancer Neg Hx   . Esophageal cancer Neg Hx   . Stomach cancer Neg Hx   . Pancreatic cancer Neg Hx   . Liver disease Neg Hx    Social History   Socioeconomic History  . Marital status: Married    Spouse name: [name]  . Number of children: Not on file  . Years of education: 28  . Highest education level: Not on file  Occupational History  . 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 Smoking status: Never Smoker  . Smokeless tobacco: Never Used  Vaping Use  . Vaping Use: Never used  Substance and Sexual Activity  . Alcohol use: Not Currently  . Drug use: No  . Sexual activity: Not Currently  Other Topics Concern  . Not on file  Social History Narrative   Pt lives with wife. Does have stairs, but patient doesn't use them. Pt has completed technical school   Social Determinants of Health   Financial Resource Strain: Not on file  Food Insecurity: Not on file  Transportation Needs: Not on file  Physical Activity: Not on file  Stress: Not  on file  Social Connections: Not on file   Past Surgical History:  Procedure Laterality Date  . CARDIAC CATHETERIZATION N/A 10/25/2015   Procedure: Left Heart Cath and Cors/Grafts Angiography;  Surgeon: Troy Sine, MD;  Location: [place] [facility];  Service: Cardiovascular;  Laterality: N/A;  . CARDIOVERSION  06/20/2011   Procedure: CARDIOVERSION;  Surgeon: Larey Dresser, MD;  Location: [facility] ENDOSCOPY;  Service: Cardiovascular;  Laterality: N/A;  . CARDIOVERSION N/A 04/26/2015   Procedure: CARDIOVERSION;  Surgeon: Dorothy Spark, MD;  Location: [facility] ENDOSCOPY;  Service: Cardiovascular;  Laterality: N/A;  . CARDIOVERSION N/A 05/10/2015   Procedure: CARDIOVERSION;  Surgeon: Larey Dresser, MD;  Location: [place];  Service: Cardiovascular;  Laterality: N/A;  . CARDIOVERSION N/A  12/10/2018   Procedure: CARDIOVERSION;  Surgeon: Sanda Klein, MD;  Location: [place];  Service: Cardiovascular;  Laterality: N/A;  . CATARACT EXTRACTION  05/2010   left eye  . CATARACT EXTRACTION  04/2010   right eye  . COLONOSCOPY WITH PROPOFOL N/A 02/22/2020   Procedure: COLONOSCOPY WITH PROPOFOL;  Surgeon: Lavena Bullion, DO;  Location: [facility] ENDOSCOPY;  Service: Gastroenterology;  Laterality: N/A;  . CORONARY ARTERY BYPASS GRAFT     stent  . ESOPHAGOGASTRODUODENOSCOPY  03-25-2005  . LEFT HEART CATH AND CORS/GRAFTS ANGIOGRAPHY N/A 02/19/2018   Procedure: LEFT HEART CATH AND CORS/GRAFTS ANGIOGRAPHY;  Surgeon: Martinique, Peter M, MD;  Location: [place] [facility];  Service: Cardiovascular;  Laterality: N/A;  . NASAL SEPTUM SURGERY    . PERCUTANEOUS PLACEMENT INTRAVASCULAR STENT CERVICAL CAROTID ARTERY     03-2009; using a drug-eluting platform of the circumflex cornoray artery with a 3.0 x 18 [employer] Promus drug-eluting platform post dilated to 3.75 with a noncompliant balloon.  Marland Kitchen POLYPECTOMY  02/22/2020   Procedure: POLYPECTOMY;  Surgeon: Lavena Bullion, DO;  Location: [facility] ENDOSCOPY;  Service: Gastroenterology;;  . TEE WITHOUT CARDIOVERSION  06/20/2011   Procedure: TRANSESOPHAGEAL ECHOCARDIOGRAM (TEE);  Surgeon: Larey Dresser, MD;  Location: [name] ENDOSCOPY;  Service: Cardiovascular;  Laterality: N/A;   Past Surgical History:  Procedure Laterality Date  . CARDIAC CATHETERIZATION N/A 10/25/2015   Procedure: Left Heart Cath and Cors/Grafts Angiography;  Surgeon: Troy Sine, MD;  Location: [place] [facility];  Service: Cardiovascular;  Laterality: N/A;  . CARDIOVERSION  06/20/2011   Procedure: CARDIOVERSION;  Surgeon: Larey Dresser, MD;  Location: [facility] ENDOSCOPY;  Service: Cardiovascular;  Laterality: N/A;  . CARDIOVERSION N/A 04/26/2015   Procedure: CARDIOVERSION;  Surgeon: Dorothy Spark, MD;  Location: [facility] ENDOSCOPY;  Service: Cardiovascular;  Laterality: N/A;  . CARDIOVERSION  N/A 05/10/2015   Procedure: CARDIOVERSION;  Surgeon: Larey Dresser, MD;  Location: [place];  Service: Cardiovascular;  Laterality: N/A;  . CARDIOVERSION N/A 12/10/2018   Procedure: CARDIOVERSION;  Surgeon: Sanda Klein, MD;  Location: [place];  Service: Cardiovascular;  Laterality: N/A;  . CATARACT EXTRACTION  05/2010   left eye  . CATARACT EXTRACTION  04/2010   right eye  . COLONOSCOPY WITH PROPOFOL N/A 02/22/2020   Procedure: COLONOSCOPY WITH PROPOFOL;  Surgeon: Lavena Bullion, DO;  Location: [facility] ENDOSCOPY;  Service: Gastroenterology;  Laterality: N/A;  . CORONARY ARTERY BYPASS GRAFT     stent  . ESOPHAGOGASTRODUODENOSCOPY  03-25-2005  . LEFT HEART CATH AND CORS/GRAFTS ANGIOGRAPHY N/A 02/19/2018   Procedure: LEFT HEART CATH AND CORS/GRAFTS ANGIOGRAPHY;  Surgeon: Martinique, Peter M, MD;  Location: [place] [facility];  Service: Cardiovascular;  Laterality: N/A;  . NASAL SEPTUM SURGERY    .  PERCUTANEOUS PLACEMENT INTRAVASCULAR STENT CERVICAL CAROTID ARTERY     03-2009; using a drug-eluting platform of the circumflex cornoray artery with a 3.0 x 18 [employer] Promus drug-eluting platform post dilated to 3.75 with a noncompliant balloon.  Marland Kitchen POLYPECTOMY  02/22/2020   Procedure: POLYPECTOMY;  Surgeon: Lavena Bullion, DO;  Location: [facility] ENDOSCOPY;  Service: Gastroenterology;;  . TEE WITHOUT CARDIOVERSION  06/20/2011   Procedure: TRANSESOPHAGEAL ECHOCARDIOGRAM (TEE);  Surgeon: Larey Dresser, MD;  Location: [place];  Service: Cardiovascular;  Laterality: N/A;   Past Medical History:  Diagnosis Date  . Acute encephalopathy 12/12/2015  . Acute respiratory failure with hypoxia and hypercapnia ([place]) 12/12/2015  . Allergic rhinitis 02/13/2010   Qualifier: Diagnosis of  By: Wynona Luna   . Angina at rest [facility]) 10/22/2015  . Ataxia 08/10/2013  . Atrial fibrillation ([place]) 11/2018  . B12 deficiency 08/11/2013  . BACK PAIN, LUMBAR 01/29/2009   Qualifier: Diagnosis of  By: Wynona Luna   . BENIGN POSITIONAL VERTIGO 11/23/2009   Qualifier: Diagnosis of  By: Wynona Luna   . Chronic diastolic heart failure ([place]) 04/24/2015  . Chronic pain    left sided-[place]  . Coronary 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 ([place]) 11/04/2015   s/p CABG 2004; CFX DES 2011  . Essential hypertension 12/12/2006   Qualifier: Diagnosis of  By: [name] RMA, Lucy    . GERD 12/12/2006   Qualifier: Diagnosis of  By: [name] RMA, Lucy    . GOUT 12/12/2006   Qualifier: Diagnosis of  By: Reatha Armour, Lucy    . Hemorrhoids   . Hyperlipidemia LDL goal <70 12/12/2006   Qualifier: Diagnosis of  By: [name] RMA, Lucy     . Hypogonadism male 04/10/2011  . Hypothyroidism, acquired 02/15/2013  . Insomnia 05/21/2013  . Long term current use of anticoagulant therapy 06/24/2011  . Obstructive sleep apnea-Failed CPAP 06/17/2007   Failed CPAP Using O 2 for sleep   . Paroxysmal atrial fibrillation ([place]) 12/06/2016  . Stroke (cerebrum) ([place]) 01/13/2017   also 1996; resultant hemiplegia of dominant side  . Thoracic aorta atherosclerosis ([place]) 04/27/2017  . Tubular adenoma of colon 05/2010  . Type 2 diabetes mellitus without complication, without long-term current use of insulin ([place]) 10/05/2007   Qualifier: Diagnosis of  By: Wynona Luna   . Vasovagal syncope   . Wears glasses    BP 127/79   Pulse 67   Temp 98.3 F (36.8 C)   Ht 6' 1"  (1.854 m)   Wt 244 lb 9.6 oz (110.9 kg)   SpO2 97%   BMI 32.27 kg/m   Opioid Risk Score:   Fall Risk Score:  `1  Depression screen PHQ 2/9  Depression screen [facility] 2/9 05/17/2020 04/19/2020 09/23/2019 11/10/2018 07/28/2017 07/13/2017  Decreased Interest 0 0 0 0 0 0  Down, Depressed, Hopeless 0 - 0 0 0 0  PHQ - 2 Score 0 0 0 0 0 0  Some recent data might be hidden    Review of Systems  Constitutional: Negative.   HENT: Negative.   Respiratory: Negative.   Cardiovascular:       Leg pain left  Gastrointestinal: Negative.   Endocrine: Negative.    Genitourinary: Negative.   Musculoskeletal: Positive for gait problem.       Leg pain left side  Skin: Negative.   Allergic/Immunologic: Negative.   Neurological:       Burning  Hematological: Bruises/bleeds  easily.       Eliquis... he believes pain is related to eliquis  Psychiatric/Behavioral: Negative.   All other systems reviewed and are negative.      Objective:   Physical Exam Vitals and nursing note reviewed.  Constitutional:      Appearance: He is obese.  HENT:     Head: Normocephalic and atraumatic.  Eyes:     Extraocular Movements: Extraocular movements intact.     Conjunctiva/sclera: Conjunctivae normal.     Pupils: Pupils are equal, round, and reactive to light.  Cardiovascular:     Comments: Absent pedal pulses bilaterally 2+ right posterior tibial pulse, absent left posterior tibial pulse There is 2+ left foot and ankle edema Musculoskeletal:     Comments:  No tenderness to palpation over the dorsal or plantar surface of the right foot no pain with toe range of motion or with ankle range of motion.  No joint swelling in the left or right foot  Skin:    General: Skin is warm and dry.     Comments: Left foot mildly pink compared to the right foot.  No skin lesions noted.   Neurological:     Mental Status: He is alert.    Motor strength is 4 - in the left hip flexor knee extensor 3 - ankle dorsiflexor and plantar flexor this is unchanged compared to prior There is no hypersensitivity to touch in the left foot or ankle area. Patient ambulates with a cane no evidence of toe drag or knee instability he has a forward flexed gait which is chronic.  The patient has stiffness of the left knee which is chronic during swing phase. Calf tenderness no calf swelling no calf erythema      Assessment & Plan:  #1.  Left foot pain describes as positional mainly when the foot is down improves when the foot is up.  Neurologic exam is unchanged compared to baseline. He does  have an absent left pedal and posterior tibial pulse.  Exam is complicated by 2+ edema in the left ankle. The right asymptomatic foot does have decreased left pedal pulse but has a normal posterior tibial pulse. Blood work was normal as above.  No signs of joint effusion or inflammation in the foot or ankle area We discussed with patient that this could either be a vascular issue i.e. peripheral artery disease or related to his lumbar spinal stenosis.  There are no skin color changes or skin lesions and he is still ambulating at baseline so this does not appear to be an emergent condition.  I would like him to get in with vascular surgery or at least have arterial ultrasound performed in the next week or so.  If this is negative we may need a repeat MRI of the lumbar spine.

## 2020-05-17 NOTE — Patient Instructions (Signed)
If vascular ultrasound is ok would check MRI or CT of back

## 2020-05-21 ENCOUNTER — Telehealth: Payer: Self-pay | Admitting: *Deleted

## 2020-05-21 NOTE — Telephone Encounter (Signed)
Alex from [facility] called to clarify if Dr Letta Pate wanted just a LE arterial scan or a scan and a consult.  Please advise.

## 2020-05-21 NOTE — Telephone Encounter (Signed)
Per Dr Letta Pate, mainly needs the LE duplex dopplers, but if consult will get him scheduled sooner hat would be ok too. Message left for [facility].

## 2020-05-22 ENCOUNTER — Other Ambulatory Visit: Payer: Self-pay

## 2020-05-22 DIAGNOSIS — M79605 Pain in left leg: Secondary | ICD-10-CM

## 2020-05-23 ENCOUNTER — Other Ambulatory Visit: Payer: Self-pay | Admitting: Family

## 2020-05-24 ENCOUNTER — Ambulatory Visit (HOSPITAL_COMMUNITY)
Admission: RE | Admit: 2020-05-24 | Discharge: 2020-05-24 | Disposition: A | Discharge status: Home / Self Care | Payer: Medicare Other | Source: Ambulatory Visit | Attending: Vascular Surgery | Admitting: Vascular Surgery

## 2020-05-24 ENCOUNTER — Other Ambulatory Visit: Payer: Self-pay | Admitting: Physical Medicine & Rehabilitation

## 2020-05-24 ENCOUNTER — Encounter: Payer: Self-pay | Admitting: Vascular Surgery

## 2020-05-24 ENCOUNTER — Other Ambulatory Visit: Payer: Self-pay

## 2020-05-24 ENCOUNTER — Ambulatory Visit (INDEPENDENT_AMBULATORY_CARE_PROVIDER_SITE_OTHER): Payer: Medicare Other | Admitting: Vascular Surgery

## 2020-05-24 ENCOUNTER — Ambulatory Visit (INDEPENDENT_AMBULATORY_CARE_PROVIDER_SITE_OTHER)
Admission: RE | Admit: 2020-05-24 | Discharge: 2020-05-24 | Disposition: A | Discharge status: Home / Self Care | Payer: Medicare Other | Source: Ambulatory Visit | Attending: Vascular Surgery | Admitting: Vascular Surgery

## 2020-05-24 VITALS — BP 119/68 | HR 73 | Temp 97.4°F | Resp 20 | Ht 73.0 in | Wt 242.0 lb

## 2020-05-24 DIAGNOSIS — M79605 Pain in left leg: Secondary | ICD-10-CM

## 2020-05-24 NOTE — Progress Notes (Signed)
  Referring Physician: Dr. Kirsteins  Patient name: [name] MRN: [id number] DOB: 11/02/1945 Sex: male  REASON FOR CONSULT: left foot pain  HPI: Normal [name] is a 75 y.o. male, status post stroke causing left hemiplegia in 1996.  About 2 weeks ago the patient began to experience pain in his left foot.  He states that it is like a knife being stabbed into the plantar aspect of his left foot.  It has not improved over the last 2 weeks.  He is able to walk.  However, he has significant pain.  He also has some pain in his back and has received injections for this in the past.  He does not really describe claudication.  He ambulates with a walker.  He is on Eliquis for past history of atrial fibrillation.  He states that in the past he has been told he has neuropathy.  He is currently on Neurontin 100 mg once a day.  He states that he could not increase the dose because they were worried while he was on Eliquis.  Patient has chronic swelling in the left leg which she states has been much improved over the last few years and it used to be much worse.  He has had no acute change in the swelling.  Past Medical History:  Diagnosis Date  . Acute encephalopathy 12/12/2015  . Acute respiratory failure with hypoxia and hypercapnia (HCC) 12/12/2015  . Allergic rhinitis 02/13/2010   Qualifier: Diagnosis of  By: Yoo DO, D. Robert   . Angina at rest (HCC) 10/22/2015  . Ataxia 08/10/2013  . Atrial fibrillation (HCC) 11/2018  . B12 deficiency 08/11/2013  . BACK PAIN, LUMBAR 01/29/2009   Qualifier: Diagnosis of  By: Yoo DO, D. Robert   . BENIGN POSITIONAL VERTIGO 11/23/2009   Qualifier: Diagnosis of  By: Yoo DO, D. Robert   . Chronic diastolic heart failure (HCC) 04/24/2015  . Chronic pain    left sided-Kristeins  . 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 Essential hypertension 12/12/2006   Qualifier: Diagnosis of  By: Brand  RMA, Lucy    . GERD 12/12/2006   Qualifier: Diagnosis of  By: Brand RMA, Lucy    . GOUT 12/12/2006   Qualifier: Diagnosis of  By: Brand RMA, Lucy    . Hemorrhoids   . Hyperlipidemia LDL goal <70 12/12/2006   Qualifier: Diagnosis of  By: Brand RMA, Lucy     . Hypogonadism male 04/10/2011  . Hypothyroidism, acquired 02/15/2013  . Insomnia 05/21/2013  . Long term current use of anticoagulant therapy 06/24/2011  . Obstructive sleep apnea-Failed CPAP 06/17/2007   Failed CPAP Using O 2 for sleep   . Paroxysmal atrial fibrillation (HCC) 12/06/2016  . 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; resultant hemiplegia of dominant side  . Thoracic aorta atherosclerosis (HCC) 04/27/2017  . Tubular adenoma of colon 05/2010  . Type 2 diabetes mellitus without complication, without long-term current use of insulin (HCC) 10/05/2007   Qualifier: Diagnosis of  By: Yoo DO, D. Robert   . Vasovagal syncope   . Wears glasses    Past Surgical History:  Procedure Laterality Date  . CARDIAC CATHETERIZATION N/A 10/25/2015   Procedure: Left Heart Cath and Cors/Grafts Angiography;  Surgeon: Thomas A Kelly, MD;  Location: [facility];  Service: Cardiovascular;  Laterality: N/A;  . CARDIOVERSION  06/20/2011   Procedure: CARDIOVERSION;    Surgeon: Larey Dresser, MD;  Location: [place];  Service: Cardiovascular;  Laterality: N/A;  . CARDIOVERSION N/A 04/26/2015   Procedure: CARDIOVERSION;  Surgeon: Dorothy Spark, MD;  Location: [facility] ENDOSCOPY;  Service: Cardiovascular;  Laterality: N/A;  . CARDIOVERSION N/A 05/10/2015   Procedure: CARDIOVERSION;  Surgeon: Larey Dresser, MD;  Location: [place];  Service: Cardiovascular;  Laterality: N/A;  . CARDIOVERSION N/A 12/10/2018   Procedure: CARDIOVERSION;  Surgeon: Sanda Klein, MD;  Location: [place];  Service: Cardiovascular;  Laterality: N/A;  . CATARACT EXTRACTION  05/2010   left eye  . CATARACT EXTRACTION  04/2010   right eye  . COLONOSCOPY WITH PROPOFOL  N/A 02/22/2020   Procedure: COLONOSCOPY WITH PROPOFOL;  Surgeon: Lavena Bullion, DO;  Location: [facility] ENDOSCOPY;  Service: Gastroenterology;  Laterality: N/A;  . CORONARY ARTERY BYPASS GRAFT     stent  . ESOPHAGOGASTRODUODENOSCOPY  03-25-2005  . LEFT HEART CATH AND CORS/GRAFTS ANGIOGRAPHY N/A 02/19/2018   Procedure: LEFT HEART CATH AND CORS/GRAFTS ANGIOGRAPHY;  Surgeon: Martinique, Peter M, MD;  Location: [place] [facility];  Service: Cardiovascular;  Laterality: N/A;  . NASAL SEPTUM SURGERY    . PERCUTANEOUS PLACEMENT INTRAVASCULAR STENT CERVICAL CAROTID ARTERY     03-2009; using a drug-eluting platform of the circumflex cornoray artery with a 3.0 x 18 [employer] Promus drug-eluting platform post dilated to 3.75 with a noncompliant balloon.  Marland Kitchen POLYPECTOMY  02/22/2020   Procedure: POLYPECTOMY;  Surgeon: Lavena Bullion, DO;  Location: [facility]OS[facility];  Service: Gastroenterology;;  . TEE WITHOUT CARDIOVERSION  06/20/2011   Procedure: TRANSESOPHAGEAL ECHOCARDIOGRAM (TEE);  Surgeon: Larey Dresser, MD;  Location: [facility];  Service: Cardiovascular;  Laterality: N/A;    Family History  Problem Relation Age of Onset  . Lung cancer Father        deceased  . Stroke Mother        deceased-MINISTROKES  . Colon cancer Neg Hx   . Esophageal cancer Neg Hx   . Stomach cancer Neg Hx   . Pancreatic cancer Neg Hx   . Liver disease Neg Hx     SOCIAL HISTORY: Social History   Socioeconomic History  . Marital status: Married    Spouse name: [name]  . Number of children: Not on file  . Years of education: 72  . Highest education level: Not on file  Occupational History  . 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 Smoking status: Never Smoker  . Smokeless tobacco: Never Used  Vaping Use  . Vaping Use: Never used  Substance and Sexual Activity  . Alcohol use: Not Currently  . Drug use: No  . Sexual activity: Not Currently  Other Topics Concern   . Not on file  Social History Narrative   Pt lives with wife. Does have stairs, but patient doesn't use them. Pt has completed technical school   Social Determinants of Health   Financial Resource Strain: Not on file  Food Insecurity: Not on file  Transportation Needs: Not on file  Physical Activity: Not on file  Stress: Not on file  Social Connections: Not on file  Intimate Partner Violence: Not on file    No Known Allergies  Current Outpatient Medications  Medication Sig Dispense Refill  . Accu-Chek FastClix Lancets MISC USE TO TEST 3 TIMES DAILY 102 each 0  . allopurinol (ZYLOPRIM) 100 MG tablet TAKE 2 TABLETS BY MOUTH EVERY DAY 180 tablet 1  .  atorvastatin (LIPITOR) 40 MG tablet Take 1 tablet (40 mg total) by mouth daily. 90 tablet 1  . benazepril (LOTENSIN) 10 MG tablet Take 1 tablet (10 mg total) by mouth daily. Take with additional 61m tablet of benazepril daily 90 tablet 1  . benazepril (LOTENSIN) 5 MG tablet TAKE 1 TABLET BY MOUTH DAILY WITH 10MG OF BENAZEPRIL TO EQUAL 15MG DAILY 90 tablet 1  . blood glucose meter kit and supplies KIT Dispense based on patient and insurance preference. Use up to four times daily as directed. (FOR ICD-9 250.00, 250.01). 102 each 0  . cyanocobalamin 1000 MCG tablet Take 2,000 mcg by mouth daily. Take 2 Gummies daily    . diclofenac Sodium (VOLTAREN) 1 % GEL USE AS DIRECTED (Patient taking differently: Apply 1 application topically 4 (four) times daily as needed (pain.).) 100 g 6  . diltiazem (CARDIZEM CD) 180 MG 24 hr capsule TAKE 1 CAPSULE BY MOUTH EVERY DAY (Patient taking differently: Take 180 mg by mouth daily.) 90 capsule 2  . dofetilide (TIKOSYN) 500 MCG capsule TAKE 1 CAPSULE TWICE A DAY 180 capsule 1  . ELIQUIS 5 MG TABS tablet TAKE 1 TABLET BY MOUTH TWICE A DAY (Patient taking differently: Take 5 mg by mouth 2 (two) times daily.) 180 tablet 1  . ferrous sulfate 325 (65 FE) MG tablet Take 1 tablet (325 mg total) by mouth 2 (two) times  daily with a meal. (Patient taking differently: Take 650 mg by mouth every evening.) 180 tablet 1  . fluticasone (FLONASE) 50 MCG/ACT nasal spray Place 2 sprays into both nostrils as needed for allergies or rhinitis. 16 g 0  . folic acid (FOLVITE) 1 MG tablet TAKE 1 TABLET BY MOUTH EVERY DAY 90 tablet 0  . furosemide (LASIX) 40 MG tablet TAKE 1 TABLET BY MOUTH DAILY AS NEEDED FOR EDEMA (EDEMA AND WEIGHT GAIN). (Patient taking differently: Take 40 mg by mouth daily as needed (fluid retention/swelling/weight gain.).) 90 tablet 1  . gabapentin (NEURONTIN) 100 MG capsule TAKE 1 CAPSULE BY MOUTH AT BEDTIME. (Patient taking differently: Take 100 mg by mouth at bedtime.) 90 capsule 1  . glucose blood (ACCU-CHEK GUIDE) test strip USE AS DIRECTED UP TO 4 TIMES DAILY 100 strip 0  . levothyroxine (SYNTHROID) 112 MCG tablet Take 1 tablet (112 mcg total) by mouth daily before breakfast. 90 tablet 1  . metFORMIN (GLUCOPHAGE) 500 MG tablet Take 1 tablet (500 mg total) by mouth daily with breakfast. 90 tablet 1  . metoprolol succinate (TOPROL-XL) 25 MG 24 hr tablet TAKE 1 TABLET BY MOUTH EVERY DAY (Patient taking differently: Take 25 mg by mouth daily.) 90 tablet 2  . Multiple Vitamins-Minerals (MULTIVITAMIN GUMMIES MENS PO) Take 2 tablets by mouth daily with breakfast.     . nitroGLYCERIN (NITROSTAT) 0.4 MG SL tablet Place 1 tablet (0.4 mg total) under the tongue every 5 (five) minutes as needed for chest pain. Up to 3 doses 10 tablet 2  . OXYGEN Inhale 2 L into the lungs at bedtime.    . pantoprazole (PROTONIX) 40 MG tablet Take 1 tablet (40 mg total) by mouth daily before breakfast. 90 tablet 3  . sennosides-docusate sodium (SENOKOT-S) 8.6-50 MG tablet Take 1 tablet by mouth as needed for constipation.    . traMADol (ULTRAM) 50 MG tablet TAKE 1 TABLET BY MOUTH TWICE A DAY 180 tablet 0  . traMADol (ULTRAM-ER) 200 MG 24 hr tablet TAKE 1 TABLET BY MOUTH EVERY DAY 90 tablet 0   No current  facility-administered  medications for this visit.    ROS:   General:  No weight loss, Fever, chills  HEENT: No recent headaches, no nasal bleeding, no visual changes, no sore throat  Neurologic: No dizziness, blackouts, seizures. No recent symptoms of stroke or mini- stroke. No recent episodes of slurred speech, or temporary blindness.  Cardiac: No recent episodes of chest pain/pressure, no shortness of breath at rest.  + shortness of breath with exertion.  + history of atrial fibrillation or irregular heartbeat  Vascular: No history of rest pain in feet.  No history of claudication.  No history of non-healing ulcer, No history of DVT   Pulmonary: No home oxygen, no productive cough, no hemoptysis,  No asthma or wheezing  Musculoskeletal:  [X] Arthritis, [X] Low back pain,  [X] Joint pain  Hematologic:No history of hypercoagulable state.  No history of easy bleeding.  No history of anemia  Gastrointestinal: No hematochezia or melena,  No gastroesophageal reflux, no trouble swallowing  Urinary: [ ] chronic Kidney disease, [ ] on HD - [ ] MWF or [ ] TTHS, [ ] Burning with urination, [ ] Frequent urination, [ ] Difficulty urinating;   Skin: No rashes  Psychological: No history of anxiety,  No history of depression   Physical Examination  Vitals:   05/24/20 1242  BP: 119/68  Pulse: 73  Resp: 20  Temp: (!) 97.4 F (36.3 C)  SpO2: 93%  Weight: 242 lb (109.8 kg)  Height: 6' 1" (1.854 m)    Body mass index is 31.93 kg/m.  General:  Alert and oriented, no acute distress HEENT: Normal Neck: No JVD Cardiac: Regular Rate and Rhythm Skin: No rash, left leg slightly more bluish in color compared to the right Extremity Pulses:  2+ posterior tibial pulses bilaterally Musculoskeletal: No deformity trace left leg edema compared to right  Neurologic: Upper and lower extremity motor 5/5 and symmetric  DATA:  Patient had bilateral ABIs and lower extremity arterial duplex scan of his left leg which I  reviewed and interpreted today.  ABI was triphasic greater than 1 bilaterally.  Duplex scan showed no significant arterial occlusive disease left leg.  ASSESSMENT: Left foot pain with no significant evidence of arterial occlusive disease.  Most likely I bet this represents neuropathy.  Certainly could be related to his back issues or his prior stroke.   PLAN: Patient will follow up with us on an as-needed basis.   Charles Fields, MD [facility] Office: [phone]   

## 2020-05-25 LAB — TOXASSURE SELECT,+ANTIDEPR,UR

## 2020-05-25 NOTE — Progress Notes (Signed)
Thank you for your prompt consult was very helpful !

## 2020-05-29 ENCOUNTER — Other Ambulatory Visit: Payer: Self-pay

## 2020-05-29 ENCOUNTER — Encounter: Payer: Self-pay | Admitting: Physical Medicine & Rehabilitation

## 2020-05-29 ENCOUNTER — Encounter (HOSPITAL_BASED_OUTPATIENT_CLINIC_OR_DEPARTMENT_OTHER): Payer: Medicare Other | Admitting: Physical Medicine & Rehabilitation

## 2020-05-29 VITALS — BP 115/72 | HR 76 | Temp 98.3°F | Ht 73.0 in | Wt 245.4 lb

## 2020-05-29 DIAGNOSIS — M79672 Pain in left foot: Secondary | ICD-10-CM | POA: Diagnosis not present

## 2020-05-29 DIAGNOSIS — Z79891 Long term (current) use of opiate analgesic: Secondary | ICD-10-CM | POA: Diagnosis not present

## 2020-05-29 DIAGNOSIS — M25562 Pain in left knee: Secondary | ICD-10-CM | POA: Diagnosis not present

## 2020-05-29 DIAGNOSIS — G8112 Spastic hemiplegia affecting left dominant side: Secondary | ICD-10-CM | POA: Diagnosis not present

## 2020-05-29 DIAGNOSIS — G894 Chronic pain syndrome: Secondary | ICD-10-CM | POA: Diagnosis not present

## 2020-05-29 DIAGNOSIS — G8929 Other chronic pain: Secondary | ICD-10-CM | POA: Diagnosis not present

## 2020-05-29 DIAGNOSIS — Z5181 Encounter for therapeutic drug level monitoring: Secondary | ICD-10-CM | POA: Diagnosis not present

## 2020-05-29 MED ORDER — TRAMADOL HCL 50 MG PO TABS: 50.0000 mg | ORAL_TABLET | Freq: Two times a day (BID) | ORAL | 0 refills | Status: DC

## 2020-05-29 MED ORDER — TRAMADOL HCL ER 200 MG PO TB24: 200.0000 mg | ORAL_TABLET | Freq: Every day | ORAL | 0 refills | Status: DC

## 2020-05-29 NOTE — Progress Notes (Signed)
Subjective:    Patient ID: [name], male    DOB: [birth year], 75 y.o.   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  Because of reduced dorsalis pedis pulse patient was sent to vascular surgery for further evaluation.  Fortunately arterial Dopplers were unremarkable.  The patient continues to complain of pain in his left knee left ankle left foot and toes.  It is mainly on the bottom of his foot.  The patient states it feels different than his diabetic neuropathy.  He does take chronic gabapentin for this. The stroke is also caused decreased sensation on the left side of his body.  We reviewed the MRI of the lumbar spine that was performed in 2017 showing multilevel degenerative disc with multilevel foraminal stenosis as well as central stenosis at L3-L4  Pain interferes with his ambulation distance he tends to be more sedentary sitting more as this helps relieve his pain.  He has had no falls or new trauma to his back or leg.   CLINICAL DATA:  Low back and left hip and leg pain since using a riding lawnmower 2 weeks ago.   EXAM: MRI LUMBAR SPINE WITHOUT CONTRAST   TECHNIQUE: Multiplanar, multisequence MR imaging of the lumbar spine was performed. No intravenous contrast was administered.   COMPARISON:  Plain films lumbar spine 12/05/2015.   FINDINGS: Segmentation:  Unremarkable.   Alignment:  Maintained with exaggeration of lumbar lordosis noted.   Vertebrae: Mild, remote anterior superior endplate compression fracture of L1 is seen. Scattered Schmorl's nodes are noted. Marrow signal is unremarkable.   Conus medullaris: Extends to the L1-2 level and appears normal.   Paraspinal and other soft tissues: Unremarkable.   Disc levels:   T12-L1: Shallow disc bulge and endplate  spur to the left without central canal or foraminal stenosis.   L1-2: Mild to moderate facet degenerative change and a minimal disc bulge without central canal or foraminal stenosis.   L2-3: Moderate facet arthropathy, shallow disc bulge and ligamentum flavum thickening are seen. There is moderate central canal stenosis. The foramina are open.   L3-4: Advanced bilateral facet degenerative change and a shallow disc bulge are seen. There is moderate to moderately severe central canal stenosis and narrowing of the subarticular recesses, greater on the right. The foramina are open.   L4-5: Advanced bilateral facet degenerative change and a shallow disc bulge are identified. Mild central canal narrowing is present with narrowing of the subarticular recesses but no nerve root compression. The foramina are open.   L5-S1: Advanced bilateral facet degenerative disease. Minimal disc bulge without central canal or foraminal stenosis.   IMPRESSION: Multilevel spondylosis. Central canal narrowing appears worst at L3-4 where there is moderate to moderately severe central canal stenosis with narrowing of both lateral recesses. No nerve root compression is identified. Multilevel facet arthropathy is also identified. Please see above for descriptions of individual levels.   Mild anterior and remote superior endplate compression fracture of L1.     Electronically Signed   By: Inge Rise M.D.   On: 12/08/2015 13:49    Pain Inventory Average Pain 7 Pain Right Now 8 My pain is constant and burning  In the last 24 hours, has pain interfered with the following? General activity 7 Relation with others 10 Enjoyment of life 4  What TIME of day is your pain at its worst? morning  and night Sleep (in general) Poor  Pain is worse with: walking, standing and some activites Pain improves with: rest and medication Relief from Meds: 8  Family History  Problem Relation Age of Onset  .  Lung cancer Father        deceased  . Stroke Mother        deceased-[place]  . Colon cancer Neg Hx   . Esophageal cancer Neg Hx   . Stomach cancer Neg Hx   . Pancreatic cancer Neg Hx   . Liver disease Neg Hx    Social History   Socioeconomic History  . Marital status: Married    Spouse name: [name]  . Number of children: Not on file  . Years of education: 53  . Highest education level: Not on file  Occupational History  . 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 Smoking status: Never Smoker  . Smokeless tobacco: Never Used  Vaping Use  . Vaping Use: Never used  Substance and Sexual Activity  . Alcohol use: Not Currently  . Drug use: No  . Sexual activity: Not Currently  Other Topics Concern  . Not on file  Social History Narrative   Pt lives with wife. Does have stairs, but patient doesn't use them. Pt has completed technical school   Social Determinants of Health   Financial Resource Strain: Not on file  Food Insecurity: Not on file  Transportation Needs: Not on file  Physical Activity: Not on file  Stress: Not on file  Social Connections: Not on file   Past Surgical History:  Procedure Laterality Date  . CARDIAC CATHETERIZATION N/A 10/25/2015   Procedure: Left Heart Cath and Cors/Grafts Angiography;  Surgeon: Troy Sine, MD;  Location: [place] [facility];  Service: Cardiovascular;  Laterality: N/A;  . CARDIOVERSION  06/20/2011   Procedure: CARDIOVERSION;  Surgeon: Larey Dresser, MD;  Location: [facility] ENDOSCOPY;  Service: Cardiovascular;  Laterality: N/A;  . CARDIOVERSION N/A 04/26/2015   Procedure: CARDIOVERSION;  Surgeon: Dorothy Spark, MD;  Location: [facility] ENDOSCOPY;  Service: Cardiovascular;  Laterality: N/A;  . CARDIOVERSION N/A 05/10/2015   Procedure: CARDIOVERSION;  Surgeon: Larey Dresser, MD;  Location: [place];  Service: Cardiovascular;  Laterality: N/A;  . CARDIOVERSION N/A 12/10/2018   Procedure:  CARDIOVERSION;  Surgeon: Sanda Klein, MD;  Location: [place];  Service: Cardiovascular;  Laterality: N/A;  . CATARACT EXTRACTION  05/2010   left eye  . CATARACT EXTRACTION  04/2010   right eye  . COLONOSCOPY WITH PROPOFOL N/A 02/22/2020   Procedure: COLONOSCOPY WITH PROPOFOL;  Surgeon: Lavena Bullion, DO;  Location: [facility] ENDOSCOPY;  Service: Gastroenterology;  Laterality: N/A;  . CORONARY ARTERY BYPASS GRAFT     stent  . ESOPHAGOGASTRODUODENOSCOPY  03-25-2005  . LEFT HEART CATH AND CORS/GRAFTS ANGIOGRAPHY N/A 02/19/2018   Procedure: LEFT HEART CATH AND CORS/GRAFTS ANGIOGRAPHY;  Surgeon: Martinique, Peter M, MD;  Location: [place] [facility];  Service: Cardiovascular;  Laterality: N/A;  . NASAL SEPTUM SURGERY    . PERCUTANEOUS PLACEMENT INTRAVASCULAR STENT CERVICAL CAROTID ARTERY     03-2009; using a drug-eluting platform of the circumflex cornoray artery with a 3.0 x 18 [employer] Promus drug-eluting platform post dilated to 3.75 with a noncompliant balloon.  Marland Kitchen POLYPECTOMY  02/22/2020   Procedure: POLYPECTOMY;  Surgeon: Lavena Bullion, DO;  Location: [facility] ENDOSCOPY;  Service: Gastroenterology;;  . TEE WITHOUT CARDIOVERSION  06/20/2011   Procedure: TRANSESOPHAGEAL ECHOCARDIOGRAM (TEE);  Surgeon: Larey Dresser, MD;  Location: [facility] ENDOSCOPY;  Service: Cardiovascular;  Laterality: N/A;   Past Surgical History:  Procedure Laterality Date  . CARDIAC CATHETERIZATION N/A 10/25/2015   Procedure: Left Heart Cath and Cors/Grafts Angiography;  Surgeon: Troy Sine, MD;  Location: [place] [facility];  Service: Cardiovascular;  Laterality: N/A;  . CARDIOVERSION  06/20/2011   Procedure: CARDIOVERSION;  Surgeon: Larey Dresser, MD;  Location: [facility] ENDOSCOPY;  Service: Cardiovascular;  Laterality: N/A;  . CARDIOVERSION N/A 04/26/2015   Procedure: CARDIOVERSION;  Surgeon: Dorothy Spark, MD;  Location: [facility] ENDOSCOPY;  Service: Cardiovascular;  Laterality: N/A;  . CARDIOVERSION N/A 05/10/2015    Procedure: CARDIOVERSION;  Surgeon: Larey Dresser, MD;  Location: [place];  Service: Cardiovascular;  Laterality: N/A;  . CARDIOVERSION N/A 12/10/2018   Procedure: CARDIOVERSION;  Surgeon: Sanda Klein, MD;  Location: [place];  Service: Cardiovascular;  Laterality: N/A;  . CATARACT EXTRACTION  05/2010   left eye  . CATARACT EXTRACTION  04/2010   right eye  . COLONOSCOPY WITH PROPOFOL N/A 02/22/2020   Procedure: COLONOSCOPY WITH PROPOFOL;  Surgeon: Lavena Bullion, DO;  Location: [facility] ENDOSCOPY;  Service: Gastroenterology;  Laterality: N/A;  . CORONARY ARTERY BYPASS GRAFT     stent  . ESOPHAGOGASTRODUODENOSCOPY  03-25-2005  . LEFT HEART CATH AND CORS/GRAFTS ANGIOGRAPHY N/A 02/19/2018   Procedure: LEFT HEART CATH AND CORS/GRAFTS ANGIOGRAPHY;  Surgeon: Martinique, Peter M, MD;  Location: [place] [facility];  Service: Cardiovascular;  Laterality: N/A;  . NASAL SEPTUM SURGERY    . PERCUTANEOUS PLACEMENT INTRAVASCULAR STENT CERVICAL CAROTID ARTERY     03-2009; using a drug-eluting platform of the circumflex cornoray artery with a 3.0 x 18 [employer] Promus drug-eluting platform post dilated to 3.75 with a noncompliant balloon.  Marland Kitchen POLYPECTOMY  02/22/2020   Procedure: POLYPECTOMY;  Surgeon: Lavena Bullion, DO;  Location: [facility] ENDOSCOPY;  Service: Gastroenterology;;  . TEE WITHOUT CARDIOVERSION  06/20/2011   Procedure: TRANSESOPHAGEAL ECHOCARDIOGRAM (TEE);  Surgeon: Larey Dresser, MD;  Location: [place];  Service: Cardiovascular;  Laterality: N/A;   Past Medical History:  Diagnosis Date  . Acute encephalopathy 12/12/2015  . Acute respiratory failure with hypoxia and hypercapnia ([place]) 12/12/2015  . Allergic rhinitis 02/13/2010   Qualifier: Diagnosis of  By: Wynona Luna   . Angina at rest [facility]) 10/22/2015  . Ataxia 08/10/2013  . Atrial fibrillation ([place]) 11/2018  . B12 deficiency 08/11/2013  . BACK PAIN, LUMBAR 01/29/2009   Qualifier: Diagnosis of  By: Wynona Luna   .  BENIGN POSITIONAL VERTIGO 11/23/2009   Qualifier: Diagnosis of  By: Wynona Luna   . Chronic diastolic heart failure ([place]) 04/24/2015  . Chronic pain    left sided-[place]  . Coronary 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 ([place]) 11/04/2015   s/p CABG 2004; CFX DES 2011  . Essential hypertension 12/12/2006   Qualifier: Diagnosis of  By: [name] RMA, Lucy    . GERD 12/12/2006   Qualifier: Diagnosis of  By: [name] RMA, Lucy    . GOUT 12/12/2006   Qualifier: Diagnosis of  By: Reatha Armour, Lucy    . Hemorrhoids   . Hyperlipidemia LDL goal <70 12/12/2006   Qualifier: Diagnosis of  By: [name] RMA, Lucy     . Hypogonadism male 04/10/2011  . Hypothyroidism, acquired 02/15/2013  . Insomnia 05/21/2013  .  Long term current use of anticoagulant therapy 06/24/2011  . Obstructive sleep apnea-Failed CPAP 06/17/2007   Failed CPAP Using O 2 for sleep   . Paroxysmal atrial fibrillation ([place]) 12/06/2016  . Stroke (cerebrum) ([place]) 01/13/2017   also 1996; resultant hemiplegia of dominant side  . Thoracic aorta atherosclerosis ([place]) 04/27/2017  . Tubular adenoma of colon 05/2010  . Type 2 diabetes mellitus without complication, without long-term current use of insulin ([place]) 10/05/2007   Qualifier: Diagnosis of  By: Wynona Luna   . Vasovagal syncope   . Wears glasses    BP 115/72   Pulse 76   Temp 98.3 F (36.8 C)   Ht 6\' 1"  (1.854 m)   Wt 245 lb 6.4 oz (111.3 kg)   SpO2 97%   BMI 32.38 kg/m   Opioid Risk Score:   Fall Risk Score:  `1  Depression screen PHQ 2/9  Depression screen [facility] 2/9 05/17/2020 04/19/2020 09/23/2019 11/10/2018 07/28/2017 07/13/2017  Decreased Interest 0 0 0 0 0 0  Down, Depressed, Hopeless 0 - 0 0 0 0  PHQ - 2 Score 0 0 0 0 0 0  Some recent data might be hidden   Review of Systems  Musculoskeletal: Positive for back pain and gait problem.       Pain in left leg, left arm, left foot  All other systems reviewed and are negative.      Objective:   Physical  Exam Vitals and nursing note reviewed.  Constitutional:      Appearance: He is obese.  HENT:     Head: Normocephalic and atraumatic.  Eyes:     Extraocular Movements: Extraocular movements intact.     Conjunctiva/sclera: Conjunctivae normal.     Pupils: Pupils are equal, round, and reactive to light.  Musculoskeletal:     Right lower leg: No edema.     Left lower leg: No edema.     Comments: No pain with left knee ankle or foot range of motion.  No evidence of knee joint ankle joint or toe effusions. Negative straight leg raising  Lumbar spine has no tenderness palpation.  He has a forward flexed posture.  Skin:    General: Skin is warm and dry.  Neurological:     Mental Status: He is alert and oriented to person, place, and time.     Comments: Motor strength is 4/5 in the left deltoid bicep tricep grip hip flexor knee extensor and 3 - in the left ankle dorsiflexor Right-sided strength is 5/5 in the deltoid, bicep, tricep, grip, hip flexor, knee extensor, ankle dorsiflexor and plantar flexor Sensation is reduced in the left upper extremity and left lower extremity compared to the right side. Also on the left side the bottom of the foot has decreased sensation compared to the top of the foot.  Psychiatric:        Mood and Affect: Mood normal.        Behavior: Behavior normal.           Assessment & Plan:  1.  Left lower extremity pain with normal vascular work-up by vascular surgery, the patient has pain in the knee foot and ankle.  We reviewed his MRI and there is evidence of multilevel foraminal stenosis as well as L3-4 central stenosis.  We discussed treatment options including epidural steroid injections but he would need to be off his Eliquis for this.  Also it is unclear which nerve root his symptoms are coming from.  Certainly could be anywhere from L3-S1.  Will check EMG/NCV to further direct care.  We will continue tramadol extended release 200 mg a day as well as  tramadol immediate release 50 mg twice daily this is prescribed by [place] PMNR Continue gabapentin 100 mg 3 times daily this is prescribed by PCP

## 2020-05-29 NOTE — Patient Instructions (Signed)
Will do EMG to determine which nerve is causing your symptoms

## 2020-06-12 ENCOUNTER — Other Ambulatory Visit: Payer: Self-pay | Admitting: Physical Medicine & Rehabilitation

## 2020-06-14 ENCOUNTER — Ambulatory Visit: Payer: Medicare Other | Admitting: Physical Medicine & Rehabilitation

## 2020-06-18 ENCOUNTER — Encounter (INDEPENDENT_AMBULATORY_CARE_PROVIDER_SITE_OTHER): Payer: Self-pay

## 2020-06-18 ENCOUNTER — Other Ambulatory Visit: Payer: Self-pay | Admitting: Cardiology

## 2020-06-18 DIAGNOSIS — H35341 Macular cyst, hole, or pseudohole, right eye: Secondary | ICD-10-CM | POA: Diagnosis not present

## 2020-06-18 DIAGNOSIS — E119 Type 2 diabetes mellitus without complications: Secondary | ICD-10-CM | POA: Diagnosis not present

## 2020-06-18 DIAGNOSIS — H35371 Puckering of macula, right eye: Secondary | ICD-10-CM | POA: Diagnosis not present

## 2020-06-18 DIAGNOSIS — H43811 Vitreous degeneration, right eye: Secondary | ICD-10-CM | POA: Diagnosis not present

## 2020-06-18 DIAGNOSIS — H524 Presbyopia: Secondary | ICD-10-CM | POA: Diagnosis not present

## 2020-06-18 NOTE — Telephone Encounter (Signed)
Eliquis 5mg  refill request received. Patient is 75 years old, weight-111.3kg, Crea-1.18 on 05/04/2020, Diagnosis-Afib, and last seen by Adline Peals PA on 12/13/2019. Dose is appropriate based on dosing criteria. Will send in refill to requested pharmacy.

## 2020-06-19 ENCOUNTER — Telehealth: Payer: Self-pay | Admitting: *Deleted

## 2020-06-19 NOTE — Telephone Encounter (Signed)
Urine drug screen for this encounter is consistent for prescribed medication 

## 2020-06-25 ENCOUNTER — Other Ambulatory Visit: Payer: Self-pay | Admitting: Family

## 2020-06-25 ENCOUNTER — Other Ambulatory Visit: Payer: Self-pay | Admitting: Physician Assistant

## 2020-06-26 ENCOUNTER — Other Ambulatory Visit: Payer: Self-pay

## 2020-06-26 ENCOUNTER — Encounter: Payer: Medicare Other | Attending: Physical Medicine & Rehabilitation | Admitting: Physical Medicine & Rehabilitation

## 2020-06-26 ENCOUNTER — Encounter: Payer: Self-pay | Admitting: Physical Medicine & Rehabilitation

## 2020-06-26 VITALS — BP 122/80 | HR 86 | Temp 98.8°F | Ht 73.0 in | Wt 247.6 lb

## 2020-06-26 DIAGNOSIS — M5417 Radiculopathy, lumbosacral region: Secondary | ICD-10-CM | POA: Insufficient documentation

## 2020-06-26 DIAGNOSIS — M79605 Pain in left leg: Secondary | ICD-10-CM | POA: Diagnosis not present

## 2020-06-26 NOTE — Patient Instructions (Signed)
Will schedule procedure today but if [name] does not allow you to get the injection will need to cancel appt

## 2020-06-26 NOTE — Progress Notes (Signed)
EMG NCV today to evaluate for LLE pain, severe sensorimotor poly neuropathy  Consistent with diabetic neuropathy.  Needle exam without evidence for radiculopathy affecting motor fibers Will schedule for lumbosacral  ESI , L S1 for lumbosacral radiculitis

## 2020-07-05 ENCOUNTER — Telehealth: Payer: Self-pay | Admitting: *Deleted

## 2020-07-05 NOTE — Telephone Encounter (Signed)
FAX JUST CAME WITH A DIFFERENT FAX NUMBER TO FAX CLEARANCE TO; USE FAX # [phone]

## 2020-07-05 NOTE — Telephone Encounter (Signed)
Clinical pharmacist to review Eliquis 

## 2020-07-05 NOTE — Telephone Encounter (Signed)
   [facility] Pre-operative Risk Assessment    Patient Name: Olliver Boyadjian  DOB: [birth year]  MRN: [id number]   HEARTCARE STAFF: - Please ensure there is not already an duplicate clearance open for this procedure. - Under Visit Info/Reason for Call, type in Other and utilize the format Clearance MM/DD/YY or Clearance TBD. Do not use dashes or single digits. - If request is for dental extraction, please clarify the # of teeth to be extracted.  Request for surgical clearance:  1. What type of surgery is being performed? EPIDURAL SPINAL INJECTION   2. When is this surgery scheduled? 07/10/20   3. What type of clearance is required (medical clearance vs. Pharmacy clearance to hold med vs. Both)? BOTH  4. Are there any medications that need to be held prior to surgery and how long? ELIQUIS x 3 DAYS PRIOR TO INJECTION   5. Practice name and name of physician performing surgery? [facility]; DR. Alysia Penna   6. What is the office phone number? [phone]   7.   What is the office fax number? [phone]  8.   Anesthesia type (None, local, MAC, general) ? NONE LISTED   Julaine Hua 07/05/2020, 2:03 PM  _________________________________________________________________   (provider comments below)

## 2020-07-05 NOTE — Telephone Encounter (Signed)
Request to hold Eliquis for 3 days prior to Mr [facility] scheduled on Tuesday 07/10/20 faxed to Dr Virl Axe.

## 2020-07-05 NOTE — Telephone Encounter (Signed)
Patient with diagnosis of A Fib on Eliquis for anticoagulation.    1. Procedure: EPIDURAL SPINAL INJECTION   Date of procedure: 07/10/20   CHA2DS2-VASc Score = 8  This indicates a 10.8% annual risk of stroke. The patient's score is based upon: CHF History: Yes HTN History: Yes Diabetes History: Yes Stroke History: Yes Vascular Disease History: Yes Age Score: 2 Gender Score: 0   CrCl 69 mL/min Platelet count 156K  Per protocol, patient should hold three days for spinal injection.  However, patient is high risk off anticoagulation with history of stroke.  Dr Gasper Sells, patient's cardiologists are Drs Marlou Porch and Caryl Comes who are both on vacation. Would you recommend a bridge for this patient?

## 2020-07-06 ENCOUNTER — Telehealth: Payer: Self-pay | Admitting: *Deleted

## 2020-07-06 NOTE — Telephone Encounter (Signed)
Mr [name] notified he is cleared to stop Eliquis Sunday and will resume on Wednesday.

## 2020-07-06 NOTE — Telephone Encounter (Signed)
   Name: [name]  DOB: 10/19/1945  MRN: [id number]   Primary Cardiologist: Candee Furbish, MD  Chart reviewed as part of pre-operative protocol coverage.  Left message for patient to call back for ongoing preop assessment.    Abigail Butts, PA-C 07/06/2020, 10:28 AM

## 2020-07-09 NOTE — Telephone Encounter (Signed)
   Name: [name]  DOB: 1945-11-01  MRN: [id number]   Primary Cardiologist: Candee Furbish, MD / Dr. Caryl Comes  Chart reviewed as part of pre-operative protocol coverage. Patient was contacted 07/09/2020 in reference to pre-operative risk assessment for pending surgery as outlined below.  [name] was last seen on 12/13/2019 by Malka So NP.  Since that day, [name] has done well without chest pain or worsening dyspnea.   The case was discussed with DOD, Dr. Gasper Sells, per MD's recommendation on 4/21, "I recognize that is risk of stroke is high, but his risk of bleeding complication and hematoma after his spinal would be more concerning. My suspicion is the will want to hold [facility] for 48 hours before and 24 hours after. I think as long as the patient knows he's at higher risk because of all of his medical problems; we don't plan on bridging. "  Talking with the patient today, he began to hold Eliquis since 07/08/2020.  He is aware the higher risk of stroke off of anticoagulation therapy.   Therefore, based on ACC/AHA guidelines, the patient would be at acceptable risk for the planned procedure without further cardiovascular testing.   The patient was advised that if he develops new symptoms prior to surgery to contact our office to arrange for a follow-up visit, and he verbalized understanding.  I will route this recommendation to the requesting party via Epic fax function and remove from pre-op pool. Please call with questions.  [place], [place] 07/09/2020, 11:54 AM

## 2020-07-10 ENCOUNTER — Other Ambulatory Visit: Payer: Self-pay

## 2020-07-10 ENCOUNTER — Encounter (HOSPITAL_BASED_OUTPATIENT_CLINIC_OR_DEPARTMENT_OTHER): Payer: Medicare Other | Admitting: Physical Medicine & Rehabilitation

## 2020-07-10 ENCOUNTER — Encounter: Payer: Self-pay | Admitting: Physical Medicine & Rehabilitation

## 2020-07-10 VITALS — BP 131/67 | HR 61 | Temp 98.7°F | Ht 75.0 in | Wt 243.0 lb

## 2020-07-10 DIAGNOSIS — M5417 Radiculopathy, lumbosacral region: Secondary | ICD-10-CM | POA: Diagnosis not present

## 2020-07-10 DIAGNOSIS — M79605 Pain in left leg: Secondary | ICD-10-CM | POA: Diagnosis not present

## 2020-07-10 NOTE — Progress Notes (Signed)
  PROCEDURE RECORD  Physical Medicine and Rehabilitation   Name: [name] DOB:1945/08/25 MRN: [id number]  Date:07/10/2020  Physician: Alysia Penna, MD    Nurse/CMA: Tyshawn Keel, CMA   Allergies: No Known Allergies  Consent Signed: Yes.    Is patient diabetic? No.  CBG today?   Pregnant: No. LMP: No LMP for male patient. (age [age])  Anticoagulants: yes (eliquis Stopped Sunday morning 07/08/2020) Anti-inflammatory: no Antibiotics: no  Procedure: Left S1 transforaminal epidural steroid injection  Position: Prone Start Time: 3:45pm  End Time: 3:52pm  Fluoro Time: 28s  RN/CMA Arieon Corcoran, CMA Meshulem Onorato, CMA    Time 3:30pm 3:55pm    BP 131/77 123/69    Pulse 61 61    Respirations 16 16    O2 Sat 95 96    S/S 6 6    Pain Level 7/10 5/10     D/C home with wife, patient A & O X 3, D/C instructions reviewed, and sits independently.       Patient ID: [name], male   DOB: March 12, 1946, 75 y.o.   MRN: [id number]

## 2020-07-10 NOTE — Patient Instructions (Signed)
You received an epidural steroid injection under fluoroscopic guidance. This is the most accurate way to perform an epidural injection. This injection was performed to relieve thigh or leg or foot pain that may be related to a pinched nerve in the lumbar spine. The local anesthetic injected today may cause numbness in your leg for a couple hours. If it is severe we may need to observe you for 30-60 minutes after the injection. The cortisone medicine injected today may take several days to take full effect. This medicine can also cause facial flushing or feeling of being warm.  This injection may last for days weeks or months. It can be repeated if needed. If it is not effective, another spinal level may need to be injected. Other treatments include medication management as well as physical therapy. In some cases surgery may be an option. Restart Eliquis tomorrow

## 2020-07-10 NOTE — Progress Notes (Signed)
Left S1 transforaminal epidural steroid injection under fluoroscopic guidance with contrast enhancement  Indication: Lumbosacral radiculitis is not relieved by medication management or other conservative care and interfering with self-care and mobility.   Informed consent was obtained after describing risk and benefits of the procedure with the patient, this includes bleeding, bruising, infection, paralysis and medication side effects.  The patient wishes to proceed and has given written consent.  Patient was placed in prone position.  The lumbar area was marked and prepped with Betadine.  It was entered with a 25-gauge 1-1/2 inch needle and one mL of 1% lidocaine was injected into the skin and subcutaneous tissue.  Then a 22-gauge 3.5in spinal needle was inserted into the Left S1 intervertebral foramen under AP, lateral, and oblique view.  Once needle tip was within the foramen on lateral views an dnor exceeding 6 o clock position on th epedical on AP viewed Isovue 200 was inected x 73ml Then a solution containing one mL of 10 mg per mL dexamethasone and 2 mL of 1% lidocaine was injected.  The patient tolerated procedure well.  Post procedure instructions were given.  Please see post procedure form.

## 2020-07-19 ENCOUNTER — Encounter: Payer: Self-pay | Admitting: Physical Medicine & Rehabilitation

## 2020-07-19 ENCOUNTER — Other Ambulatory Visit: Payer: Self-pay

## 2020-07-19 ENCOUNTER — Encounter: Payer: Medicare Other | Attending: Physical Medicine & Rehabilitation | Admitting: Physical Medicine & Rehabilitation

## 2020-07-19 VITALS — BP 123/77 | HR 64 | Ht 75.0 in | Wt 243.0 lb

## 2020-07-19 DIAGNOSIS — M5417 Radiculopathy, lumbosacral region: Secondary | ICD-10-CM

## 2020-07-19 DIAGNOSIS — G8112 Spastic hemiplegia affecting left dominant side: Secondary | ICD-10-CM | POA: Diagnosis not present

## 2020-07-19 MED ORDER — GABAPENTIN 300 MG PO CAPS: 300.0000 mg | ORAL_CAPSULE | Freq: Two times a day (BID) | ORAL | 1 refills | Status: DC

## 2020-07-19 NOTE — Progress Notes (Signed)
Botox Injection for spasticity using needle EMG guidance  Dilution: 50 Units/ml Indication: Severe spasticity which interferes with ADL,mobility and/or  hygiene and is unresponsive to medication management and other conservative care Informed consent was obtained after describing risks and benefits of the procedure with the patient. This includes bleeding, bruising, infection, excessive weakness, or medication side effects. A REMS form is on file and signed. Needle: 27g 1" needle electrode Number of units per muscle   left FDP 25 units  left FDS 25 units left biceps 50 units [id number] All injections were done after obtaining appropriate EMG activity and after negative drawback for blood. The patient tolerated the procedure well. Post procedure instructions were given. A followup appointment was made.      Patient states he did not get a good relief with S1 epidural on the left side.  He continues have pain going down to his left foot area.  Discussed treatment options including repeat epidural at another level.  Plan to do epidural left L5-S1 and L4-L5.  Hold Eliquis 3 days prior to procedure previously had been cleared by cardiology to do that. Increase gabapentin to 300 mg twice daily

## 2020-07-19 NOTE — Patient Instructions (Signed)
Stop eliquis 3 d prior to injection

## 2020-08-03 ENCOUNTER — Telehealth: Payer: Self-pay | Admitting: *Deleted

## 2020-08-03 NOTE — Telephone Encounter (Signed)
Mr Ju called and reports the gabapentin Dr Letta Pate prescribbed he cannot tolerate and he is going to stop it (no matter what anyone says)It does nothing but cause a buzz on him and he doesn't feel like he can walk straight on it.

## 2020-08-06 ENCOUNTER — Encounter (INDEPENDENT_AMBULATORY_CARE_PROVIDER_SITE_OTHER): Payer: Self-pay | Admitting: Ophthalmology

## 2020-08-06 ENCOUNTER — Ambulatory Visit (INDEPENDENT_AMBULATORY_CARE_PROVIDER_SITE_OTHER): Payer: Medicare Other | Admitting: Ophthalmology

## 2020-08-06 ENCOUNTER — Other Ambulatory Visit: Payer: Self-pay

## 2020-08-06 DIAGNOSIS — H35371 Puckering of macula, right eye: Secondary | ICD-10-CM

## 2020-08-06 DIAGNOSIS — E119 Type 2 diabetes mellitus without complications: Secondary | ICD-10-CM

## 2020-08-06 NOTE — Assessment & Plan Note (Signed)
No detectable diabetic retinopathy OU today The patient has diabetes without any evidence of retinopathy. The patient advised to maintain good blood glucose control, excellent blood pressure control, and favorable levels of cholesterol, low density lipoprotein, and high density lipoproteins. Follow up in 1 year was recommended. Explained that fluctuations in visual acuity , or "out of focus", may result from large variations of blood sugar control.

## 2020-08-06 NOTE — Assessment & Plan Note (Signed)
The nature of macular pucker (epiretinal membrane ERM) was discussed with the patient as well as threshold criteria for vitrectomy surgery. I explained that in rare cases another surgery is needed to actually remove a second wrinkle should it regrow.  Most often, the epiretinal membrane and underlying wrinkled internal limiting membrane are removed with the first surgery, to accomplish the goals.   If the operative eye is Phakic (natural lens still present), cataract surgery is often recommended prior to Vitrectomy. This will enable the retina surgeon to have the best view during surgery and the patient to obtain optimal results in the future. Treatment options were discussed.  Early visual acuity impact is documented in the right eye.  I will describe the way the patient should home monitor visual changes by using near vision in a monocular or 1 eye fashion

## 2020-08-06 NOTE — Progress Notes (Signed)
08/06/2020     CHIEF COMPLAINT Patient presents for Retina Evaluation (NP- Macular Cyst OD- Referred by Dr. Hecker/Pt states, " I am not having any issues with my va that I am aware of. Dr. Herbert Deaner said that in the right corner of my OD there was a spot that was concerning."/A1C: Unknown/LBS: 097/)   HISTORY OF PRESENT ILLNESS: [name] is a 75 y.o. male who presents to the clinic today for:   HPI    Retina Evaluation    Laterality: right eye   Onset: 2 months ago   Duration: 2 months   Associated Symptoms: Negative for Flashes, Floaters and Distortion   Comments: NP- Macular Cyst OD- Referred by Dr. Herbert Deaner Pt states, " I am not having any issues with my va that I am aware of. Dr. Herbert Deaner said that in the right corner of my OD there was a spot that was concerning." A1C: Unknown LBS: 097           Comments    OD, with only minor visual symptoms       Last edited by Hurman Horn, MD on 08/06/2020  9:12 AM. (History)      Referring physician: Debbrah Alar, NP [address] [place],  [place] [address]  HISTORICAL INFORMATION:   Selected notes from the MEDICAL RECORD NUMBER    Lab Results  Component Value Date   HGBA1C 5.9 05/04/2020     CURRENT MEDICATIONS: No current outpatient medications on file. (Ophthalmic Drugs)   No current facility-administered medications for this visit. (Ophthalmic Drugs)   Current Outpatient Medications (Other)  Medication Sig  . Accu-Chek FastClix Lancets MISC USE TO TEST 3 TIMES DAILY  . allopurinol (ZYLOPRIM) 100 MG tablet TAKE 2 TABLETS BY MOUTH EVERY DAY  . atorvastatin (LIPITOR) 40 MG tablet Take 1 tablet (40 mg total) by mouth daily.  . benazepril (LOTENSIN) 10 MG tablet Take 1 tablet (10 mg total) by mouth daily. Take with additional 33m tablet of benazepril daily  . benazepril (LOTENSIN) 5 MG tablet TAKE 1 TABLET BY MOUTH DAILY WITH 10MG OF BENAZEPRIL TO EQUAL 15MG DAILY  . blood glucose meter kit  and supplies KIT Dispense based on patient and insurance preference. Use up to four times daily as directed. (FOR ICD-9 250.00, 250.01).  . cyanocobalamin 1000 MCG tablet Take 2,000 mcg by mouth daily. Take 2 Gummies daily  . diclofenac Sodium (VOLTAREN) 1 % GEL USE AS DIRECTED (Patient taking differently: Apply 1 application topically 4 (four) times daily as needed (pain.).)  . diltiazem (CARDIZEM CD) 180 MG 24 hr capsule TAKE 1 CAPSULE BY MOUTH EVERY DAY (Patient taking differently: Take 180 mg by mouth daily.)  . dofetilide (TIKOSYN) 500 MCG capsule TAKE 1 CAPSULE BY MOUTH TWICE A DAY  . ELIQUIS 5 MG TABS tablet TAKE 1 TABLET BY MOUTH TWICE A DAY  . ferrous sulfate 325 (65 FE) MG tablet Take 1 tablet (325 mg total) by mouth 2 (two) times daily with a meal. (Patient taking differently: Take 650 mg by mouth every evening.)  . fluticasone (FLONASE) 50 MCG/ACT nasal spray Place 2 sprays into both nostrils as needed for allergies or rhinitis.  . folic acid (FOLVITE) 1 MG tablet Take 1 tablet (1 mg total) by mouth daily.  . furosemide (LASIX) 40 MG tablet Take 1 tablet (40 mg total) by mouth daily as needed for fluid or edema.  . gabapentin (NEURONTIN) 300 MG capsule Take 1 capsule (  300 mg total) by mouth 2 (two) times daily.  Marland Kitchen glucose blood (ACCU-CHEK GUIDE) test strip USE AS DIRECTED UP TO 4 TIMES DAILY  . levothyroxine (SYNTHROID) 112 MCG tablet Take 1 tablet (112 mcg total) by mouth daily before breakfast.  . metFORMIN (GLUCOPHAGE) 500 MG tablet Take 1 tablet (500 mg total) by mouth daily with breakfast.  . metoprolol succinate (TOPROL-XL) 25 MG 24 hr tablet TAKE 1 TABLET BY MOUTH EVERY DAY (Patient taking differently: Take 25 mg by mouth daily.)  . Multiple Vitamins-Minerals (MULTIVITAMIN GUMMIES MENS PO) Take 2 tablets by mouth daily with breakfast.   . nitroGLYCERIN (NITROSTAT) 0.4 MG SL tablet Place 1 tablet (0.4 mg total) under the tongue every 5 (five) minutes as needed for chest pain. Up to  3 doses  . OXYGEN Inhale 2 L into the lungs at bedtime.  . pantoprazole (PROTONIX) 40 MG tablet Take 1 tablet (40 mg total) by mouth daily before breakfast.  . sennosides-docusate sodium (SENOKOT-S) 8.6-50 MG tablet Take 1 tablet by mouth as needed for constipation.  . traMADol (ULTRAM) 50 MG tablet Take 1 tablet (50 mg total) by mouth 2 (two) times daily.  . traMADol (ULTRAM-ER) 200 MG 24 hr tablet Take 1 tablet (200 mg total) by mouth daily.   No current facility-administered medications for this visit. (Other)      REVIEW OF SYSTEMS:    ALLERGIES No Known Allergies  PAST MEDICAL HISTORY Past Medical History:  Diagnosis Date  . Acute encephalopathy 12/12/2015  . Acute respiratory failure with hypoxia and hypercapnia ([place]) 12/12/2015  . Allergic rhinitis 02/13/2010   Qualifier: Diagnosis of  By: Wynona Luna   . Angina at rest [facility]) 10/22/2015  . Ataxia 08/10/2013  . Atrial fibrillation ([place]) 11/2018  . B12 deficiency 08/11/2013  . BACK PAIN, LUMBAR 01/29/2009   Qualifier: Diagnosis of  By: Wynona Luna   . BENIGN POSITIONAL VERTIGO 11/23/2009   Qualifier: Diagnosis of  By: Wynona Luna   . Chronic diastolic heart failure ([place]) 04/24/2015  . Chronic pain    left sided-[place]  . Coronary 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 ([place]) 11/04/2015   s/p CABG 2004; CFX DES 2011  . Essential hypertension 12/12/2006   Qualifier: Diagnosis of  By: [name] RMA, Lucy    . GERD 12/12/2006   Qualifier: Diagnosis of  By: [name] RMA, Lucy    . GOUT 12/12/2006   Qualifier: Diagnosis of  By: Reatha Armour, Lucy    . Hemorrhoids   . Hyperlipidemia LDL goal <70 12/12/2006   Qualifier: Diagnosis of  By: [name] RMA, Lucy     . Hypogonadism male 04/10/2011  . Hypothyroidism, acquired 02/15/2013  . Insomnia 05/21/2013  . Long term current use of anticoagulant therapy 06/24/2011  . Obstructive sleep apnea-Failed CPAP 06/17/2007   Failed CPAP Using O 2 for sleep   . Paroxysmal  atrial fibrillation ([place]) 12/06/2016  . 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 Thoracic aorta atherosclerosis ([place]) 04/27/2017  . Tubular adenoma of colon 05/2010  . Type 2 diabetes mellitus without complication, without long-term current use of insulin ([place]) 10/05/2007   Qualifier: Diagnosis of  By: Wynona Luna   . Vasovagal syncope   . Wears glasses    Past Surgical History:  Procedure Laterality Date  . CARDIAC CATHETERIZATION N/A 10/25/2015   Procedure: Left Heart Cath and Cors/Grafts Angiography;  Surgeon: Joyice Faster  Claiborne Billings, MD;  Location: [facility] CV LAB;  Service: Cardiovascular;  Laterality: N/A;  . CARDIOVERSION  06/20/2011   Procedure: CARDIOVERSION;  Surgeon: Larey Dresser, MD;  Location: [facility] ENDOSCOPY;  Service: Cardiovascular;  Laterality: N/A;  . CARDIOVERSION N/A 04/26/2015   Procedure: CARDIOVERSION;  Surgeon: Dorothy Spark, MD;  Location: [facility] ENDOSCOPY;  Service: Cardiovascular;  Laterality: N/A;  . CARDIOVERSION N/A 05/10/2015   Procedure: CARDIOVERSION;  Surgeon: Larey Dresser, MD;  Location: [place] [place];  Service: Cardiovascular;  Laterality: N/A;  . CARDIOVERSION N/A 12/10/2018   Procedure: CARDIOVERSION;  Surgeon: Sanda Klein, MD;  Location: [place];  Service: Cardiovascular;  Laterality: N/A;  . CATARACT EXTRACTION  05/2010   left eye  . CATARACT EXTRACTION  04/2010   right eye  . COLONOSCOPY WITH PROPOFOL N/A 02/22/2020   Procedure: COLONOSCOPY WITH PROPOFOL;  Surgeon: Lavena Bullion, DO;  Location: [facility] ENDOSCOPY;  Service: Gastroenterology;  Laterality: N/A;  . CORONARY ARTERY BYPASS GRAFT     stent  . ESOPHAGOGASTRODUODENOSCOPY  03-25-2005  . LEFT HEART CATH AND CORS/GRAFTS ANGIOGRAPHY N/A 02/19/2018   Procedure: LEFT HEART CATH AND CORS/GRAFTS ANGIOGRAPHY;  Surgeon: Martinique, Peter M, MD;  Location: [place] CV LAB;  Service: Cardiovascular;  Laterality: N/A;  . NASAL SEPTUM SURGERY    .  PERCUTANEOUS PLACEMENT INTRAVASCULAR STENT CERVICAL CAROTID ARTERY     03-2009; using a drug-eluting platform of the circumflex cornoray artery with a 3.0 x 18 [employer] Promus drug-eluting platform post dilated to 3.75 with a noncompliant balloon.  Marland Kitchen POLYPECTOMY  02/22/2020   Procedure: POLYPECTOMY;  Surgeon: Lavena Bullion, DO;  Location: [facility] ENDOSCOPY;  Service: Gastroenterology;;  . TEE WITHOUT CARDIOVERSION  06/20/2011   Procedure: TRANSESOPHAGEAL ECHOCARDIOGRAM (TEE);  Surgeon: Larey Dresser, MD;  Location: [facility] ENDOSCOPY;  Service: Cardiovascular;  Laterality: N/A;    FAMILY HISTORY Family History  Problem Relation Age of Onset  . Lung cancer Father        deceased  . Stroke Mother        deceased-[place]ES  . Colon cancer Neg Hx   . Esophageal cancer Neg Hx   . Stomach cancer Neg Hx   . Pancreatic cancer Neg Hx   . Liver disease Neg Hx     SOCIAL HISTORY Social History   Tobacco Use  . Smoking status: Never Smoker  . Smokeless tobacco: Never Used  Vaping Use  . Vaping Use: Never used  Substance Use Topics  . Alcohol use: Not Currently  . Drug use: No         OPHTHALMIC EXAM: Base Eye Exam    Visual Acuity (ETDRS)      Right Left   Dist cc 20/30 -2 20/25 -1   Dist ph cc NI    Correction: Glasses       Pupils      Pupils Dark Light Shape React APD   Right PERRL 3 2 Round Brisk None   Left PERRL 3 2 Round Brisk None       Visual Fields (Counting fingers)      Left Right    Full Full       Extraocular Movement      Right Left    Full Full       Neuro/Psych    Oriented x3: Yes       Dilation    Both eyes: 1.0% Mydriacyl, 2.5% Phenylephrine @ 8:58 AM        Slit Lamp and  Fundus Exam    External Exam      Right Left   External Normal Normal       Slit Lamp Exam      Right Left   Lids/Lashes Normal Normal   Conjunctiva/Sclera White and quiet White and quiet   Cornea Clear Clear   Anterior Chamber Deep and quiet Deep and quiet    Iris Round and reactive Round and reactive   Lens Centered posterior chamber intraocular lens Centered posterior chamber intraocular lens   Anterior Vitreous Normal Normal       Fundus Exam      Right Left   Posterior Vitreous Posterior vitreous detachment Posterior vitreous detachment   Disc Normal Normal   C/D Ratio 0.55 0.5   Macula Epiretinal membrane, moderate to severe topographic distortion Normal   Vessels Normal Normal   Periphery Normal Normal          IMAGING AND PROCEDURES  Imaging and Procedures for 08/06/20  OCT, Retina - OU - Both Eyes       Right Eye Quality was good. Scan locations included subfoveal. Central Foveal Thickness: 389. Progression has no prior data. Findings include abnormal foveal contour, epiretinal membrane.   Left Eye Quality was good. Central Foveal Thickness: 281. Progression has no prior data. Findings include normal foveal contour.   Notes Macular thickening right eye with epiretinal membrane giving a pseudocyst of the inner retina and some early inner foveal macular schisis temporally.  OS with minor epiretinal membrane none foveal superonasal, no impact on macular anatomy                ASSESSMENT/PLAN:  Type 2 diabetes mellitus without complication, without long-term current use of insulin (HCC) No detectable diabetic retinopathy OU today The patient has diabetes without any evidence of retinopathy. The patient advised to maintain good blood glucose control, excellent blood pressure control, and favorable levels of cholesterol, low density lipoprotein, and high density lipoproteins. Follow up in 1 year was recommended. Explained that fluctuations in visual acuity , or "out of focus", may result from large variations of blood sugar control.  Macular pucker, right eye The nature of macular pucker (epiretinal membrane ERM) was discussed with the patient as well as threshold criteria for vitrectomy surgery. I explained that in  rare cases another surgery is needed to actually remove a second wrinkle should it regrow.  Most often, the epiretinal membrane and underlying wrinkled internal limiting membrane are removed with the first surgery, to accomplish the goals.   If the operative eye is Phakic (natural lens still present), cataract surgery is often recommended prior to Vitrectomy. This will enable the retina surgeon to have the best view during surgery and the patient to obtain optimal results in the future. Treatment options were discussed.  Early visual acuity impact is documented in the right eye.  I will describe the way the patient should home monitor visual changes by using near vision in a monocular or 1 eye fashion      ICD-10-CM   1. Macular pucker, right eye  H35.371 OCT, Retina - OU - Both Eyes  2. Type 2 diabetes mellitus without complication, without long-term current use of insulin (HCC)  E11.9     1.  Moderate visual acuity impact OD from epiretinal membrane.  Patient will need monitor this at home and we will follow to look for changes over the next 8 to 12 weeks.  If changes occur or symptoms become more noticeable will  offer surgical intervention to remedy the distortion of the fovea right eye.  2.  3.  Ophthalmic Meds Ordered this visit:  No orders of the defined types were placed in this encounter.      Return in about 8 weeks (around 10/01/2020) for dilate, OD, OCT.  There are no Patient Instructions on file for this visit.   Explained the diagnoses, plan, and follow up with the patient and they expressed understanding.  Patient expressed understanding of the importance of proper follow up care.   Clent Demark Nathanie Ottley M.D. Diseases & Surgery of the Retina and Vitreous [facility] [place] 08/06/20     Abbreviations: M myopia (nearsighted); A astigmatism; H hyperopia (farsighted); P presbyopia; Mrx spectacle prescription;  CTL contact lenses; OD right eye; OS left eye; OU both  eyes  XT exotropia; ET esotropia; PEK punctate epithelial keratitis; PEE punctate epithelial erosions; DES dry eye syndrome; MGD meibomian gland dysfunction; ATs artificial tears; PFAT's preservative free artificial tears; [place] nuclear sclerotic cataract; PSC posterior subcapsular cataract; ERM epi-retinal membrane; PVD posterior vitreous detachment; RD retinal detachment; DM diabetes mellitus; DR diabetic retinopathy; NPDR non-proliferative diabetic retinopathy; PDR proliferative diabetic retinopathy; CSME clinically significant macular edema; DME diabetic macular edema; dbh dot blot hemorrhages; CWS cotton wool spot; POAG primary open angle glaucoma; C/D cup-to-disc ratio; HVF humphrey visual field; GVF goldmann visual field; OCT optical coherence tomography; IOP intraocular pressure; BRVO Branch retinal vein occlusion; CRVO central retinal vein occlusion; CRAO central retinal artery occlusion; BRAO branch retinal artery occlusion; RT retinal tear; SB scleral buckle; PPV pars plana vitrectomy; VH Vitreous hemorrhage; PRP panretinal laser photocoagulation; IVK intravitreal kenalog; VMT vitreomacular traction; MH Macular hole;  NVD neovascularization of the disc; NVE neovascularization elsewhere; AREDS age related eye disease study; ARMD age related macular degeneration; POAG primary open angle glaucoma; EBMD epithelial/anterior basement membrane dystrophy; ACIOL anterior chamber intraocular lens; IOL intraocular lens; PCIOL posterior chamber intraocular lens; Phaco/IOL phacoemulsification with intraocular lens placement; [place] photorefractive keratectomy; LASIK laser assisted in situ keratomileusis; HTN hypertension; DM diabetes mellitus; COPD chronic obstructive pulmonary disease

## 2020-08-07 ENCOUNTER — Encounter (HOSPITAL_COMMUNITY): Payer: Self-pay | Admitting: *Deleted

## 2020-08-07 ENCOUNTER — Other Ambulatory Visit: Payer: Self-pay

## 2020-08-07 ENCOUNTER — Ambulatory Visit (HOSPITAL_COMMUNITY)
Admission: EM | Admit: 2020-08-07 | Discharge: 2020-08-07 | Disposition: A | Discharge status: Home / Self Care | Payer: Medicare Other | Attending: Emergency Medicine | Admitting: Emergency Medicine

## 2020-08-07 DIAGNOSIS — T148XXA Other injury of unspecified body region, initial encounter: Secondary | ICD-10-CM | POA: Diagnosis not present

## 2020-08-07 DIAGNOSIS — L089 Local infection of the skin and subcutaneous tissue, unspecified: Secondary | ICD-10-CM | POA: Diagnosis not present

## 2020-08-07 DIAGNOSIS — S61210A Laceration without foreign body of right index finger without damage to nail, initial encounter: Secondary | ICD-10-CM | POA: Diagnosis not present

## 2020-08-07 MED ORDER — DOXYCYCLINE HYCLATE 100 MG PO CAPS: 100.0000 mg | ORAL_CAPSULE | Freq: Two times a day (BID) | ORAL | 0 refills | Status: DC

## 2020-08-07 NOTE — ED Provider Notes (Signed)
[place]    CSN: [id number] Arrival date & time: 08/07/20  1257      History   Chief Complaint Chief Complaint  Patient presents with  . Laceration    HPI [name] is a 75 y.o. male.   Patient here for evaluation of right index finger injury.  Reports cutting finger on a table saw on Friday.  Now having purulent drainage, redness, and swelling.  Reports pain and difficulty bending finger due to pain. Reports applying antibiotic ointment and soaking finger with no relief.  Denies any specific alleviating or aggravating factors.  Denies any fevers, chest pain, shortness of breath, N/V/D, numbness, tingling, weakness, abdominal pain, or headaches.    The history is provided by the patient.    Past Medical History:  Diagnosis Date  . Acute encephalopathy 12/12/2015  . Acute respiratory failure with hypoxia and hypercapnia ([place]) 12/12/2015  . Allergic rhinitis 02/13/2010   Qualifier: Diagnosis of  By: Wynona Luna   . Angina at rest [facility]) 10/22/2015  . Ataxia 08/10/2013  . Atrial fibrillation ([place]) 11/2018  . B12 deficiency 08/11/2013  . BACK PAIN, LUMBAR 01/29/2009   Qualifier: Diagnosis of  By: Wynona Luna   . BENIGN POSITIONAL VERTIGO 11/23/2009   Qualifier: Diagnosis of  By: Wynona Luna   . Chronic diastolic heart failure ([place]) 04/24/2015  . Chronic pain    left sided-[place]  . Coronary 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 ([place]) 11/04/2015   s/p CABG 2004; CFX DES 2011  . Essential hypertension 12/12/2006   Qualifier: Diagnosis of  By: [name] RMA, Lucy    . GERD 12/12/2006   Qualifier: Diagnosis of  By: [name] RMA, Lucy    . GOUT 12/12/2006   Qualifier: Diagnosis of  By: Reatha Armour, Lucy    . Hemorrhoids   . Hyperlipidemia LDL goal <70 12/12/2006   Qualifier: Diagnosis of  By: [name] RMA, Lucy     . Hypogonadism male 04/10/2011  . Hypothyroidism, acquired 02/15/2013  . Insomnia 05/21/2013  . Long term current use of  anticoagulant therapy 06/24/2011  . Obstructive sleep apnea-Failed CPAP 06/17/2007   Failed CPAP Using O 2 for sleep   . Paroxysmal atrial fibrillation ([place]) 12/06/2016  . 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 Thoracic aorta atherosclerosis ([place]) 04/27/2017  . Tubular adenoma of colon 05/2010  . Type 2 diabetes mellitus without complication, without long-term current use of insulin ([place]) 10/05/2007   Qualifier: Diagnosis of  By: Wynona Luna   . Vasovagal syncope   . Wears glasses     Patient Active Problem List   Diagnosis Date Noted  . Macular pucker, right eye 08/06/2020  . Hx of adenomatous colonic polyps   . Adenomatous polyp of ascending colon   . Skin tag of anus   . Rectal varices   . Primary osteoarthritis of first carpometacarpal joint of right hand 08/09/2019  . Secondary hypercoagulable state ([place]) 08/02/2019  . External hemorrhoid 05/19/2019  . Chest wall hematoma 12/16/2018  . Delirium 12/16/2018  . Blood loss anemia 12/16/2018  . Dysuria 12/16/2018  . Acute encephalopathy 12/16/2018  . Acute on chronic diastolic heart failure ([place])   . Anticoagulated on warfarin   . Elevated troponin   . Congestive heart failure ([place])   . Abnormal INR   . AKI (acute kidney injury) ([place])   . History of gout   .  History of hypothyroidism   . History of type 2 diabetes mellitus   . Atrial fibrillation with rapid ventricular response ([place]) 11/27/2018  . Ischemic chest pain ([place]) 02/17/2018  . Pure hypercholesterolemia 05/01/2017  . Thoracic aorta atherosclerosis ([place]) 04/27/2017  . 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 RUQ abdominal pain 01/13/2017  . Paroxysmal atrial fibrillation ([place]) 12/06/2016  . Dyspnea on exertion 10/21/2016  . Acute respiratory failure with hypoxia and hypercapnia ([place]) 12/12/2015  . Coronary 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 ([place]) 11/04/2015  . CAD S/P CFX DES 2011 04/25/2015   . Longstanding persistent atrial fibrillation ([place]) 04/25/2015  . Vasovagal syncope   . Atypical chest pain 04/24/2015  . Constipation 04/24/2015  . Chronic diastolic heart failure ([place]) 04/24/2015  . B12 deficiency 08/11/2013  . Sensory disturbance 08/11/2013  . Ataxia 08/10/2013  . Laceration of ear, external, right 07/05/2013  . Insomnia 05/21/2013  . Hypothyroidism, acquired 02/15/2013  . Weakness 11/17/2012  . Spastic hemiplegia affecting dominant side ([place]) 07/21/2011  . Long term current use of anticoagulant therapy 06/24/2011  . Hypogonadism male 04/10/2011  . Fatigue 03/31/2011  . Allergic rhinitis 02/13/2010  . BENIGN POSITIONAL VERTIGO 11/23/2009  . 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 Hypothyroidism 04/02/2009  . Back pain 01/29/2009  . Type 2 diabetes mellitus without complication, without long-term current use of insulin ([place]) 10/05/2007  . Obstructive sleep apnea-Failed CPAP 06/17/2007  . Hyperlipidemia LDL goal <70 12/12/2006  . GOUT 12/12/2006  . Essential hypertension 12/12/2006  . History of stroke with residual deficit 12/12/2006  . GERD 12/12/2006    Past Surgical History:  Procedure Laterality Date  . CARDIAC CATHETERIZATION N/A 10/25/2015   Procedure: Left Heart Cath and Cors/Grafts Angiography;  Surgeon: Troy Sine, MD;  Location: [place] [facility];  Service: Cardiovascular;  Laterality: N/A;  . CARDIOVERSION  06/20/2011   Procedure: CARDIOVERSION;  Surgeon: Larey Dresser, MD;  Location: [facility] ENDOSCOPY;  Service: Cardiovascular;  Laterality: N/A;  . CARDIOVERSION N/A 04/26/2015   Procedure: CARDIOVERSION;  Surgeon: Dorothy Spark, MD;  Location: [facility] ENDOSCOPY;  Service: Cardiovascular;  Laterality: N/A;  . CARDIOVERSION N/A 05/10/2015   Procedure: CARDIOVERSION;  Surgeon: Larey Dresser, MD;  Location: [place] [facility];  Service: Cardiovascular;  Laterality: N/A;  . CARDIOVERSION N/A 12/10/2018   Procedure: CARDIOVERSION;  Surgeon: Sanda Klein, MD;   Location: [place];  Service: Cardiovascular;  Laterality: N/A;  . CATARACT EXTRACTION  05/2010   left eye  . CATARACT EXTRACTION  04/2010   right eye  . COLONOSCOPY WITH PROPOFOL N/A 02/22/2020   Procedure: COLONOSCOPY WITH PROPOFOL;  Surgeon: Lavena Bullion, DO;  Location: [facility] ENDOSCOPY;  Service: Gastroenterology;  Laterality: N/A;  . CORONARY ARTERY BYPASS GRAFT     stent  . ESOPHAGOGASTRODUODENOSCOPY  03-25-2005  . LEFT HEART CATH AND CORS/GRAFTS ANGIOGRAPHY N/A 02/19/2018   Procedure: LEFT HEART CATH AND CORS/GRAFTS ANGIOGRAPHY;  Surgeon: Martinique, Peter M, MD;  Location: [place] [facility];  Service: Cardiovascular;  Laterality: N/A;  . NASAL SEPTUM SURGERY    . PERCUTANEOUS PLACEMENT INTRAVASCULAR STENT CERVICAL CAROTID ARTERY     03-2009; using a drug-eluting platform of the circumflex cornoray artery with a 3.0 x 18 [employer] Promus drug-eluting platform post dilated to 3.75 with a noncompliant balloon.  Marland Kitchen POLYPECTOMY  02/22/2020   Procedure: POLYPECTOMY;  Surgeon: Lavena Bullion, DO;  Location: [facility] ENDOSCOPY;  Service: Gastroenterology;;  . TEE WITHOUT CARDIOVERSION  06/20/2011   Procedure: TRANSESOPHAGEAL  ECHOCARDIOGRAM (TEE);  Surgeon: Larey Dresser, MD;  Location: [place];  Service: Cardiovascular;  Laterality: N/A;       Home Medications    Prior to Admission medications   Medication Sig Start Date End Date Taking? Authorizing Provider  doxycycline (VIBRAMYCIN) 100 MG capsule Take 1 capsule (100 mg total) by mouth 2 (two) times daily. 08/07/20  Yes Pearson Forster, NP  Accu-Chek FastClix Lancets MISC USE TO TEST 3 TIMES DAILY 06/07/19   Isaiah Serge, NP  allopurinol (ZYLOPRIM) 100 MG tablet TAKE 2 TABLETS BY MOUTH EVERY DAY 05/07/20   Debbrah Alar, NP  atorvastatin (LIPITOR) 40 MG tablet Take 1 tablet (40 mg total) by mouth daily. 03/26/20   Debbrah Alar, NP  benazepril (LOTENSIN) 10 MG tablet Take 1 tablet (10 mg total) by mouth daily. Take  with additional 61m tablet of benazepril daily 03/26/20   ODebbrah Alar NP  benazepril (LOTENSIN) 5 MG tablet TAKE 1 TABLET BY MOUTH DAILY WITH 10MG OF BENAZEPRIL TO EQUAL 15MG DAILY 03/26/20   ODebbrah Alar NP  blood glucose meter kit and supplies KIT Dispense based on patient and insurance preference. Use up to four times daily as directed. (FOR ICD-9 250.00, 250.01). 01/26/19   IIsaiah Serge NP  cyanocobalamin 1000 MCG tablet Take 2,000 mcg by mouth daily. Take 2 Gummies daily    [provider]  diclofenac Sodium (VOLTAREN) 1 % GEL USE AS DIRECTED Patient taking differently: Apply 1 application topically 4 (four) times daily as needed (pain.). 08/29/19   XLeandrew Koyanagi MD  diltiazem (CARDIZEM CD) 180 MG 24 hr capsule TAKE 1 CAPSULE BY MOUTH EVERY DAY Patient taking differently: Take 180 mg by mouth daily. 02/03/20   KDeboraha Sprang MD  dofetilide (TIKOSYN) 500 MCG capsule TAKE 1 CAPSULE BY MOUTH TWICE A DAY 06/27/20   UBaldwin Jamaica PA-C  ELIQUIS 5 MG TABS tablet TAKE 1 TABLET BY MOUTH TWICE A DAY 06/18/20   SJerline Pain MD  ferrous sulfate 325 (65 FE) MG tablet Take 1 tablet (325 mg total) by mouth 2 (two) times daily with a meal. Patient taking differently: Take 650 mg by mouth every evening. 05/25/19   ODebbrah Alar NP  fluticasone (FLONASE) 50 MCG/ACT nasal spray Place 2 sprays into both nostrils as needed for allergies or rhinitis. 04/14/19   IIsaiah Serge NP  folic acid (FOLVITE) 1 MG tablet Take 1 tablet (1 mg total) by mouth daily. 06/25/20   ODebbrah Alar NP  furosemide (LASIX) 40 MG tablet Take 1 tablet (40 mg total) by mouth daily as needed for fluid or edema. 06/25/20   ODebbrah Alar NP  gabapentin (NEURONTIN) 300 MG capsule Take 1 capsule (300 mg total) by mouth 2 (two) times daily. 07/19/20   Kirsteins, ALuanna Salk MD  glucose blood (ACCU-CHEK GUIDE) test strip USE AS DIRECTED UP TO 4 TIMES DAILY 03/21/19   ODebbrah Alar NP   levothyroxine (SYNTHROID) 112 MCG tablet Take 1 tablet (112 mcg total) by mouth daily before breakfast. 02/03/20   ODebbrah Alar NP  metFORMIN (GLUCOPHAGE) 500 MG tablet Take 1 tablet (500 mg total) by mouth daily with breakfast. 06/25/20   ODebbrah Alar NP  metoprolol succinate (TOPROL-XL) 25 MG 24 hr tablet TAKE 1 TABLET BY MOUTH EVERY DAY Patient taking differently: Take 25 mg by mouth daily. 12/21/19   KDeboraha Sprang MD  Multiple Vitamins-Minerals (MULTIVITAMIN GUMMIES MENS PO) Take 2 tablets by mouth daily with breakfast.  [provider]  nitroGLYCERIN (NITROSTAT) 0.4 MG SL tablet Place 1 tablet (0.4 mg total) under the tongue every 5 (five) minutes as needed for chest pain. Up to 3 doses 03/05/15   Debbrah Alar, NP  OXYGEN Inhale 2 L into the lungs at bedtime.    [provider]  pantoprazole (PROTONIX) 40 MG tablet Take 1 tablet (40 mg total) by mouth daily before breakfast. 05/23/20   Debbrah Alar, NP  sennosides-docusate sodium (SENOKOT-S) 8.6-50 MG tablet Take 1 tablet by mouth as needed for constipation.    [provider]  traMADol (ULTRAM) 50 MG tablet Take 1 tablet (50 mg total) by mouth 2 (two) times daily. 05/29/20   Kirsteins, Luanna Salk, MD  traMADol (ULTRAM-ER) 200 MG 24 hr tablet Take 1 tablet (200 mg total) by mouth daily. 05/29/20   Kirsteins, Luanna Salk, MD    Family History Family History  Problem Relation Age of Onset  . Lung cancer Father        deceased  . Stroke Mother        deceased-MINISTROKES  . Colon cancer Neg Hx   . Esophageal cancer Neg Hx   . Stomach cancer Neg Hx   . Pancreatic cancer Neg Hx   . Liver disease Neg Hx     Social History Social History   Tobacco Use  . Smoking status: Never Smoker  . Smokeless tobacco: Never Used  Vaping Use  . Vaping Use: Never used  Substance Use Topics  . Alcohol use: Not Currently  . Drug use: No     Allergies   Patient has no known  allergies.   Review of Systems Review of Systems  Musculoskeletal: Positive for joint swelling.  Skin: Positive for wound.  All other systems reviewed and are negative.    Physical Exam Triage Vital Signs ED Triage Vitals  Enc Vitals Group     BP 08/07/20 1441 (!) 136/59     Pulse Rate 08/07/20 1441 79     Resp 08/07/20 1441 20     Temp 08/07/20 1441 98.2 F (36.8 C)     Temp src --      SpO2 08/07/20 1441 97 %     Weight --      Height --      Head Circumference --      Peak Flow --      Pain Score 08/07/20 1438 10     Pain Loc --      Pain Edu? --      Excl. in [place]? --    No data found.  Updated Vital Signs BP (!) 136/59   Pulse 79   Temp 98.2 F (36.8 C)   Resp 20   SpO2 97%   Visual Acuity Right Eye Distance:   Left Eye Distance:   Bilateral Distance:    Right Eye Near:   Left Eye Near:    Bilateral Near:     Physical Exam Vitals and nursing note reviewed.  Constitutional:      General: He is not in acute distress.    Appearance: Normal appearance. He is not ill-appearing, toxic-appearing or diaphoretic.  HENT:     Head: Normocephalic and atraumatic.  Eyes:     Conjunctiva/sclera: Conjunctivae normal.  Cardiovascular:     Rate and Rhythm: Normal rate.     Pulses: Normal pulses.  Pulmonary:     Effort: Pulmonary effort is normal.  Abdominal:     General: Abdomen is flat.  Musculoskeletal:        General: Normal range of motion.     Cervical back: Normal range of motion.  Skin:    General: Skin is warm and dry.     Findings: Erythema and wound (purulent drainage from incision, see photo below) present.  Neurological:     General: No focal deficit present.     Mental Status: He is alert and oriented to person, place, and time.  Psychiatric:        Mood and Affect: Mood normal.          UC Treatments / Results  Labs (all labs ordered are listed, but only abnormal results are displayed) Labs Reviewed - No data to  display  EKG   Radiology OCT, Retina - OU - Both Eyes  Result Date: 08/06/2020 Right Eye Quality was good. Scan locations included subfoveal. Central Foveal Thickness: 389. Progression has no prior data. Findings include abnormal foveal contour, epiretinal membrane. Left Eye Quality was good. Central Foveal Thickness: 281. Progression has no prior data. Findings include normal foveal contour. Notes Macular thickening right eye with epiretinal membrane giving a pseudocyst of the inner retina and some early inner foveal macular schisis temporally. OS with minor epiretinal membrane none foveal superonasal, no impact on macular anatomy   Procedures Procedures (including critical care time)  Medications Ordered in UC Medications - No data to display  Initial Impression / Assessment and Plan / UC Course  I have reviewed the triage vital signs and the nursing notes.  Pertinent labs & imaging results that were available during my care of the patient were reviewed by me and considered in my medical decision making (see chart for details).    Doxycycline twice daily for the next 10 days.  Continue to do Epsom salt soaks 3-4 times a day to encourage drainage.  May take ibuprofen/naproxen or Tylenol as needed for pain and swelling.  Strict ED follow-up for any worsening symptoms.  Follow-up with primary care as needed. Final Clinical Impressions(s) / UC Diagnoses   Final diagnoses:  Laceration of right index finger without damage to nail, foreign body presence unspecified, initial encounter  Infected laceration     Discharge Instructions     Take the doxycycline one pill twice a day for the next 10 days.  Continue to soak your finger 3-4 times a day in epsom salt soak.    Take Ibuprofen/Naproxen or Tylenol as needed for pain and swelling.    If the redness or swelling gets worse, you have red streaks up your hand, or you develop a fever- go directly the the Emergency Department for further  evaluation.   Return or go to the Emergency Department if symptoms worsen or do not improve in the next few days.     ED Prescriptions    Medication Sig Dispense Auth. Provider   doxycycline (VIBRAMYCIN) 100 MG capsule Take 1 capsule (100 mg total) by mouth 2 (two) times daily. 20 capsule Pearson Forster, NP     PDMP not reviewed this encounter.   Pearson Forster, NP 08/07/20 [phone]

## 2020-08-07 NOTE — ED Triage Notes (Signed)
Pt cut his Rt index finger on a table saw Friday.

## 2020-08-07 NOTE — Discharge Instructions (Addendum)
Take the doxycycline one pill twice a day for the next 10 days.  Continue to soak your finger 3-4 times a day in epsom salt soak.    Take Ibuprofen/Naproxen or Tylenol as needed for pain and swelling.    If the redness or swelling gets worse, you have red streaks up your hand, or you develop a fever- go directly the the Emergency Department for further evaluation.   Return or go to the Emergency Department if symptoms worsen or do not improve in the next few days.

## 2020-08-08 ENCOUNTER — Other Ambulatory Visit: Payer: Self-pay | Admitting: Family

## 2020-08-08 MED ORDER — BENAZEPRIL HCL 10 MG PO TABS: 10.0000 mg | ORAL_TABLET | Freq: Every day | ORAL | 0 refills | Status: DC

## 2020-08-10 ENCOUNTER — Other Ambulatory Visit: Payer: Self-pay

## 2020-08-10 ENCOUNTER — Encounter (HOSPITAL_COMMUNITY): Payer: Self-pay | Admitting: Emergency Medicine

## 2020-08-10 ENCOUNTER — Ambulatory Visit (HOSPITAL_COMMUNITY)
Admission: EM | Admit: 2020-08-10 | Discharge: 2020-08-10 | Disposition: A | Discharge status: Home / Self Care | Payer: Medicare Other

## 2020-08-10 DIAGNOSIS — S61211D Laceration without foreign body of left index finger without damage to nail, subsequent encounter: Secondary | ICD-10-CM

## 2020-08-10 DIAGNOSIS — S6991XD Unspecified injury of right wrist, hand and finger(s), subsequent encounter: Secondary | ICD-10-CM | POA: Diagnosis not present

## 2020-08-10 NOTE — ED Triage Notes (Signed)
Pt presents for follow-up of right ring finger after cutting on table saw last week.

## 2020-08-10 NOTE — ED Provider Notes (Signed)
[name]    CSN: [id number] Arrival date & time: 08/10/20  1606      History   Chief Complaint Chief Complaint  Patient presents with  . Finger Injury    HPI [name] is a 75 y.o. male.   Patient here for reevaluation of right index finger after cutting it on table saw last week.  Patient does report that he hit it on the edge of the table saw again.  Bleeding is controlled.  Redness and swelling are improved.  Reports compliance with medications, including antibiotics. Denies any specific alleviating or aggravating factors.  Denies any fevers, chest pain, shortness of breath, N/V/D, numbness, tingling, weakness, abdominal pain, or headaches.    The history is provided by the patient.    Past Medical History:  Diagnosis Date  . Acute encephalopathy 12/12/2015  . Acute respiratory failure with hypoxia and hypercapnia ([place]) 12/12/2015  . Allergic rhinitis 02/13/2010   Qualifier: Diagnosis of  By: Wynona Luna   . Angina at rest [facility]) 10/22/2015  . Ataxia 08/10/2013  . Atrial fibrillation ([place]) 11/2018  . B12 deficiency 08/11/2013  . BACK PAIN, LUMBAR 01/29/2009   Qualifier: Diagnosis of  By: Wynona Luna   . BENIGN POSITIONAL VERTIGO 11/23/2009   Qualifier: Diagnosis of  By: Wynona Luna   . Chronic diastolic heart failure ([place]) 04/24/2015  . Chronic pain    left sided-Kristeins  . Coronary 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 ([place]) 11/04/2015   s/p CABG 2004; CFX DES 2011  . Essential hypertension 12/12/2006   Qualifier: Diagnosis of  By: [name] RMA, Lucy    . GERD 12/12/2006   Qualifier: Diagnosis of  By: [name] RMA, Lucy    . GOUT 12/12/2006   Qualifier: Diagnosis of  By: Reatha Armour, Lucy    . Hemorrhoids   . Hyperlipidemia LDL goal <70 12/12/2006   Qualifier: Diagnosis of  By: [name] RMA, Lucy     . Hypogonadism male 04/10/2011  . Hypothyroidism, acquired 02/15/2013  . Insomnia 05/21/2013  . Long term current use of  anticoagulant therapy 06/24/2011  . Obstructive sleep apnea-Failed CPAP 06/17/2007   Failed CPAP Using O 2 for sleep   . Paroxysmal atrial fibrillation ([place]) 12/06/2016  . 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 Thoracic aorta atherosclerosis ([place]) 04/27/2017  . Tubular adenoma of colon 05/2010  . Type 2 diabetes mellitus without complication, without long-term current use of insulin ([place]) 10/05/2007   Qualifier: Diagnosis of  By: Wynona Luna   . Vasovagal syncope   . Wears glasses     Patient Active Problem List   Diagnosis Date Noted  . Macular pucker, right eye 08/06/2020  . Hx of adenomatous colonic polyps   . Adenomatous polyp of ascending colon   . Skin tag of anus   . Rectal varices   . Primary osteoarthritis of first carpometacarpal joint of right hand 08/09/2019  . Secondary hypercoagulable state ([place]) 08/02/2019  . External hemorrhoid 05/19/2019  . Chest wall hematoma 12/16/2018  . Delirium 12/16/2018  . Blood loss anemia 12/16/2018  . Dysuria 12/16/2018  . Acute encephalopathy 12/16/2018  . Acute on chronic diastolic heart failure ([place])   . Anticoagulated on warfarin   . Elevated troponin   . Congestive heart failure ([place])   . Abnormal INR   . AKI (acute kidney injury) ([place])   . History  of gout   . History of hypothyroidism   . History of type 2 diabetes mellitus   . Atrial fibrillation with rapid ventricular response ([place]) 11/27/2018  . Ischemic chest pain ([place]) 02/17/2018  . Pure hypercholesterolemia 05/01/2017  . Thoracic aorta atherosclerosis ([place]) 04/27/2017  . 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 RUQ abdominal pain 01/13/2017  . Paroxysmal atrial fibrillation ([place]) 12/06/2016  . Dyspnea on exertion 10/21/2016  . Acute respiratory failure with hypoxia and hypercapnia ([place]) 12/12/2015  . Coronary 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 ([place]) 11/04/2015  . CAD S/P CFX DES 2011 04/25/2015   . Longstanding persistent atrial fibrillation ([place]) 04/25/2015  . Vasovagal syncope   . Atypical chest pain 04/24/2015  . Constipation 04/24/2015  . Chronic diastolic heart failure ([place]) 04/24/2015  . B12 deficiency 08/11/2013  . Sensory disturbance 08/11/2013  . Ataxia 08/10/2013  . Laceration of ear, external, right 07/05/2013  . Insomnia 05/21/2013  . Hypothyroidism, acquired 02/15/2013  . Weakness 11/17/2012  . Spastic hemiplegia affecting dominant side ([place]) 07/21/2011  . Long term current use of anticoagulant therapy 06/24/2011  . Hypogonadism male 04/10/2011  . Fatigue 03/31/2011  . Allergic rhinitis 02/13/2010  . BENIGN POSITIONAL VERTIGO 11/23/2009  . 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 Hypothyroidism 04/02/2009  . Back pain 01/29/2009  . Type 2 diabetes mellitus without complication, without long-term current use of insulin ([place]) 10/05/2007  . Obstructive sleep apnea-Failed CPAP 06/17/2007  . Hyperlipidemia LDL goal <70 12/12/2006  . GOUT 12/12/2006  . Essential hypertension 12/12/2006  . History of stroke with residual deficit 12/12/2006  . GERD 12/12/2006    Past Surgical History:  Procedure Laterality Date  . CARDIAC CATHETERIZATION N/A 10/25/2015   Procedure: Left Heart Cath and Cors/Grafts Angiography;  Surgeon: Troy Sine, MD;  Location: [place] [facility];  Service: Cardiovascular;  Laterality: N/A;  . CARDIOVERSION  06/20/2011   Procedure: CARDIOVERSION;  Surgeon: Larey Dresser, MD;  Location: [facility] ENDOSCOPY;  Service: Cardiovascular;  Laterality: N/A;  . CARDIOVERSION N/A 04/26/2015   Procedure: CARDIOVERSION;  Surgeon: Dorothy Spark, MD;  Location: [facility] ENDOSCOPY;  Service: Cardiovascular;  Laterality: N/A;  . CARDIOVERSION N/A 05/10/2015   Procedure: CARDIOVERSION;  Surgeon: Larey Dresser, MD;  Location: [place];  Service: Cardiovascular;  Laterality: N/A;  . CARDIOVERSION N/A 12/10/2018   Procedure: CARDIOVERSION;  Surgeon: Sanda Klein, MD;   Location: [place];  Service: Cardiovascular;  Laterality: N/A;  . CATARACT EXTRACTION  05/2010   left eye  . CATARACT EXTRACTION  04/2010   right eye  . COLONOSCOPY WITH PROPOFOL N/A 02/22/2020   Procedure: COLONOSCOPY WITH PROPOFOL;  Surgeon: Lavena Bullion, DO;  Location: [facility] ENDOSCOPY;  Service: Gastroenterology;  Laterality: N/A;  . CORONARY ARTERY BYPASS GRAFT     stent  . ESOPHAGOGASTRODUODENOSCOPY  03-25-2005  . LEFT HEART CATH AND CORS/GRAFTS ANGIOGRAPHY N/A 02/19/2018   Procedure: LEFT HEART CATH AND CORS/GRAFTS ANGIOGRAPHY;  Surgeon: Martinique, Peter M, MD;  Location: [place] [facility];  Service: Cardiovascular;  Laterality: N/A;  . NASAL SEPTUM SURGERY    . PERCUTANEOUS PLACEMENT INTRAVASCULAR STENT CERVICAL CAROTID ARTERY     03-2009; using a drug-eluting platform of the circumflex cornoray artery with a 3.0 x 18 [employer] Promus drug-eluting platform post dilated to 3.75 with a noncompliant balloon.  Marland Kitchen POLYPECTOMY  02/22/2020   Procedure: POLYPECTOMY;  Surgeon: Lavena Bullion, DO;  Location: [facility] ENDOSCOPY;  Service: Gastroenterology;;  . TEE WITHOUT CARDIOVERSION  06/20/2011   Procedure: TRANSESOPHAGEAL ECHOCARDIOGRAM (TEE);  Surgeon: Larey Dresser, MD;  Location: [place];  Service: Cardiovascular;  Laterality: N/A;       Home Medications    Prior to Admission medications   Medication Sig Start Date End Date Taking? Authorizing Provider  Accu-Chek FastClix Lancets MISC USE TO TEST 3 TIMES DAILY 06/07/19   Isaiah Serge, NP  allopurinol (ZYLOPRIM) 100 MG tablet TAKE 2 TABLETS BY MOUTH EVERY DAY 05/07/20   Debbrah Alar, NP  atorvastatin (LIPITOR) 40 MG tablet Take 1 tablet (40 mg total) by mouth daily. 03/26/20   Debbrah Alar, NP  benazepril (LOTENSIN) 10 MG tablet Take 1 tablet (10 mg total) by mouth daily. Take with additional 17m tablet of benazepril daily 08/08/20   ODebbrah Alar NP  benazepril (LOTENSIN) 5 MG tablet TAKE 1 TABLET BY  MOUTH DAILY WITH 10MG OF BENAZEPRIL TO EQUAL 15MG DAILY 08/08/20   ODebbrah Alar NP  blood glucose meter kit and supplies KIT Dispense based on patient and insurance preference. Use up to four times daily as directed. (FOR ICD-9 250.00, 250.01). 01/26/19   IIsaiah Serge NP  cyanocobalamin 1000 MCG tablet Take 2,000 mcg by mouth daily. Take 2 Gummies daily    [provider]  diclofenac Sodium (VOLTAREN) 1 % GEL USE AS DIRECTED Patient taking differently: Apply 1 application topically 4 (four) times daily as needed (pain.). 08/29/19   XLeandrew Koyanagi MD  diltiazem (CARDIZEM CD) 180 MG 24 hr capsule TAKE 1 CAPSULE BY MOUTH EVERY DAY Patient taking differently: Take 180 mg by mouth daily. 02/03/20   KDeboraha Sprang MD  dofetilide (TIKOSYN) 500 MCG capsule TAKE 1 CAPSULE BY MOUTH TWICE A DAY 06/27/20   UBaldwin Jamaica PA-C  doxycycline (VIBRAMYCIN) 100 MG capsule Take 1 capsule (100 mg total) by mouth 2 (two) times daily. 08/07/20   SPearson Forster NP  ELIQUIS 5 MG TABS tablet TAKE 1 TABLET BY MOUTH TWICE A DAY 06/18/20   SJerline Pain MD  ferrous sulfate 325 (65 FE) MG tablet Take 1 tablet (325 mg total) by mouth 2 (two) times daily with a meal. Patient taking differently: Take 650 mg by mouth every evening. 05/25/19   ODebbrah Alar NP  fluticasone (FLONASE) 50 MCG/ACT nasal spray Place 2 sprays into both nostrils as needed for allergies or rhinitis. 04/14/19   IIsaiah Serge NP  folic acid (FOLVITE) 1 MG tablet Take 1 tablet (1 mg total) by mouth daily. 06/25/20   ODebbrah Alar NP  furosemide (LASIX) 40 MG tablet Take 1 tablet (40 mg total) by mouth daily as needed for fluid or edema. 06/25/20   ODebbrah Alar NP  gabapentin (NEURONTIN) 300 MG capsule Take 1 capsule (300 mg total) by mouth 2 (two) times daily. 07/19/20   Kirsteins, ALuanna Salk MD  glucose blood (ACCU-CHEK GUIDE) test strip USE AS DIRECTED UP TO 4 TIMES DAILY 03/21/19   ODebbrah Alar NP   levothyroxine (SYNTHROID) 112 MCG tablet Take 1 tablet (112 mcg total) by mouth daily before breakfast. 02/03/20   ODebbrah Alar NP  metFORMIN (GLUCOPHAGE) 500 MG tablet Take 1 tablet (500 mg total) by mouth daily with breakfast. 06/25/20   ODebbrah Alar NP  metoprolol succinate (TOPROL-XL) 25 MG 24 hr tablet TAKE 1 TABLET BY MOUTH EVERY DAY Patient taking differently: Take 25 mg by mouth daily. 12/21/19   KDeboraha Sprang MD  Multiple Vitamins-Minerals (MULTIVITAMIN GUMMIES MENS PO) Take 2 tablets by mouth daily  with breakfast.     [provider]  nitroGLYCERIN (NITROSTAT) 0.4 MG SL tablet Place 1 tablet (0.4 mg total) under the tongue every 5 (five) minutes as needed for chest pain. Up to 3 doses 03/05/15   Debbrah Alar, NP  OXYGEN Inhale 2 L into the lungs at bedtime.    [provider]  pantoprazole (PROTONIX) 40 MG tablet Take 1 tablet (40 mg total) by mouth daily before breakfast. 05/23/20   Debbrah Alar, NP  sennosides-docusate sodium (SENOKOT-S) 8.6-50 MG tablet Take 1 tablet by mouth as needed for constipation.    [provider]  traMADol (ULTRAM) 50 MG tablet Take 1 tablet (50 mg total) by mouth 2 (two) times daily. 05/29/20   Kirsteins, Luanna Salk, MD  traMADol (ULTRAM-ER) 200 MG 24 hr tablet Take 1 tablet (200 mg total) by mouth daily. 05/29/20   Kirsteins, Luanna Salk, MD    Family History Family History  Problem Relation Age of Onset  . Lung cancer Father        deceased  . Stroke Mother        deceased-MINISTROKES  . Colon cancer Neg Hx   . Esophageal cancer Neg Hx   . Stomach cancer Neg Hx   . Pancreatic cancer Neg Hx   . Liver disease Neg Hx     Social History Social History   Tobacco Use  . Smoking status: Never Smoker  . Smokeless tobacco: Never Used  Vaping Use  . Vaping Use: Never used  Substance Use Topics  . Alcohol use: Not Currently  . Drug use: No     Allergies   Patient has no known  allergies.   Review of Systems Review of Systems  Skin: Positive for wound.  All other systems reviewed and are negative.    Physical Exam Triage Vital Signs ED Triage Vitals  Enc Vitals Group     BP 08/10/20 1742 125/63     Pulse Rate 08/10/20 1742 62     Resp 08/10/20 1742 16     Temp 08/10/20 1742 98.6 F (37 C)     Temp Source 08/10/20 1742 Oral     SpO2 08/10/20 1742 97 %     Weight --      Height --      Head Circumference --      Peak Flow --      Pain Score 08/10/20 1739 3     Pain Loc --      Pain Edu? --      Excl. in [place]? --    No data found.  Updated Vital Signs BP 125/63 (BP Location: Right Arm)   Pulse 62   Temp 98.6 F (37 C) (Oral)   Resp 16   SpO2 97%   Visual Acuity Right Eye Distance:   Left Eye Distance:   Bilateral Distance:    Right Eye Near:   Left Eye Near:    Bilateral Near:     Physical Exam Vitals and nursing note reviewed.  Constitutional:      General: He is not in acute distress.    Appearance: Normal appearance. He is not ill-appearing, toxic-appearing or diaphoretic.  HENT:     Head: Normocephalic and atraumatic.  Eyes:     Conjunctiva/sclera: Conjunctivae normal.  Cardiovascular:     Rate and Rhythm: Normal rate.     Pulses: Normal pulses.  Pulmonary:     Effort: Pulmonary effort is normal.  Abdominal:  General: Abdomen is flat.  Musculoskeletal:        General: Normal range of motion.     Cervical back: Normal range of motion.  Skin:    General: Skin is warm and dry.     Findings: Wound (wound that does appear to be slightler larger than previous wound, no purulent drainage noted at this time, redness and swelling reduced) present.  Neurological:     General: No focal deficit present.     Mental Status: He is alert and oriented to person, place, and time.  Psychiatric:        Mood and Affect: Mood normal.        UC Treatments / Results  Labs (all labs ordered are listed, but only abnormal results  are displayed) Labs Reviewed - No data to display  EKG   Radiology No results found.  Procedures Procedures (including critical care time)  Medications Ordered in UC Medications - No data to display  Initial Impression / Assessment and Plan / UC Course  I have reviewed the triage vital signs and the nursing notes.  Pertinent labs & imaging results that were available during my care of the patient were reviewed by me and considered in my medical decision making (see chart for details).     Finger wound cleaned with shur-clens and pressure bandage applied.  Continue antibiotics previously prescribed.  Soak finger/hand in epsom salt soak 3-4 times a day and change dressing.  Encouraged patient to stop using table saw while finger is healing.  Follow up as needed.  Final Clinical Impressions(s) / UC Diagnoses   Final diagnoses:  Injury of finger of right hand, subsequent encounter  Laceration of left index finger without foreign body without damage to nail, subsequent encounter     Discharge Instructions     Continue to take the antibiotics as prescribed.   Soak your hand in an epsom salt soak 3-4 times a day and change the dressing.     Follow up if you notice any worsening redness, swelling, or drainage.    Return or go to the Emergency Department if symptoms worsen or do not improve in the next few days.      ED Prescriptions    None     PDMP not reviewed this encounter.   Pearson Forster, NP 08/10/20 [phone]

## 2020-08-10 NOTE — Discharge Instructions (Addendum)
Continue to take the antibiotics as prescribed.   Soak your hand in an epsom salt soak 3-4 times a day and change the dressing.     Follow up if you notice any worsening redness, swelling, or drainage.    Return or go to the Emergency Department if symptoms worsen or do not improve in the next few days.

## 2020-08-23 ENCOUNTER — Encounter: Payer: Medicare Other | Attending: Physical Medicine & Rehabilitation | Admitting: Physical Medicine & Rehabilitation

## 2020-08-23 ENCOUNTER — Ambulatory Visit: Payer: Medicare Other | Admitting: Physical Medicine & Rehabilitation

## 2020-08-23 DIAGNOSIS — M5417 Radiculopathy, lumbosacral region: Secondary | ICD-10-CM | POA: Insufficient documentation

## 2020-09-04 ENCOUNTER — Other Ambulatory Visit: Payer: Self-pay

## 2020-09-04 ENCOUNTER — Encounter (HOSPITAL_BASED_OUTPATIENT_CLINIC_OR_DEPARTMENT_OTHER): Payer: Medicare Other | Admitting: Physical Medicine & Rehabilitation

## 2020-09-04 ENCOUNTER — Encounter: Payer: Self-pay | Admitting: Physical Medicine & Rehabilitation

## 2020-09-04 VITALS — BP 118/70 | HR 69 | Temp 98.4°F | Ht 75.0 in | Wt 239.0 lb

## 2020-09-04 DIAGNOSIS — M5417 Radiculopathy, lumbosacral region: Secondary | ICD-10-CM | POA: Diagnosis not present

## 2020-09-04 MED ORDER — TRAMADOL HCL ER 200 MG PO TB24: 200.0000 mg | ORAL_TABLET | Freq: Every day | ORAL | 0 refills | Status: DC

## 2020-09-04 MED ORDER — TRAMADOL HCL 50 MG PO TABS: 50.0000 mg | ORAL_TABLET | Freq: Two times a day (BID) | ORAL | 0 refills | Status: DC

## 2020-09-04 NOTE — Progress Notes (Signed)
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   Informed consent was obtained after describing risk and benefits of the procedure with the patient, this includes bleeding, bruising, infection, paralysis and medication side effects.  The patient wishes to proceed and has given written consent.  Patient was placed in prone position.  The lumbar area was marked and prepped with Betadine.  It was entered with a 25-gauge 1-1/2 inch needle and one mL of 1% lidocaine was injected into the skin and subcutaneous tissue. 22g 5" spinal needle attepted to place at Left L3-4 unable to gain depth due to foraminal bone,  Then a 22-gauge 5" spinal needle was inserted into the Left L4-5 intervertebral foramen under AP, lateral, and oblique view.  Once needle tip was just outside foramen  on AP viewed Isovue 200 was inected x 72ml Then a solution containing one mL of 10 mg per mL dexamethasone and 2 mL of 1% lidocaine was injected.  The patient tolerated procedure well.  Post procedure instructions were given.  Please see post procedure form.   Consider L3-4 translaminar route if LLE not well relieved

## 2020-09-04 NOTE — Progress Notes (Signed)
  PROCEDURE RECORD [name]   Name: [name] DOB:Jun 30, 1945 MRN: [id number]  Date:09/04/2020  Physician: Alysia Penna, MD    Nurse/CMA: Truman Hayward, CMA  Allergies: No Known Allergies  Consent Signed: Yes.    Is patient diabetic? Yes.    CBG today? 109  Pregnant: No. LMP: No LMP for male patient. (age [age])  Anticoagulants: yes (Eliquis and stopped for 3 days) Anti-inflammatory: no Antibiotics: no  Procedure: Left L3-4 Epidural Steroid Injection  Position: Prone Start Time:11:10am  End Time: 11:37am  Fluoro Time: 1:10  RN/CMA Truman Hayward, CMA Teylor Wolven, CMA    Time 10:52am 11:45am    BP 118/70 119 79    Pulse 69 70    Respirations 16 16    O2 Sat 93 97    S/S 6 6    Pain Level 6/10 6/10     D/C home with no one, patient A & O X 3, D/C instructions reviewed, and sits independently.

## 2020-09-04 NOTE — Patient Instructions (Signed)
You received an epidural steroid injection under fluoroscopic guidance. This is the most accurate way to perform an epidural injection. This injection was performed to relieve thigh or leg or foot pain that may be related to a pinched nerve in the lumbar spine. The local anesthetic injected today may cause numbness in your leg for a couple hours. If it is severe we may need to observe you for 30-60 minutes after the injection. The cortisone medicine injected today may take several days to take full effect. This medicine can also cause facial flushing or feeling of being warm.  This injection may last for days weeks or months. It can be repeated if needed. If it is not effective, another spinal level may need to be injected. Other treatments include medication management as well as physical therapy. In some cases surgery may be an option.   Resume Eliquis tomorrow am

## 2020-09-05 ENCOUNTER — Other Ambulatory Visit: Payer: Self-pay | Admitting: Physical Medicine & Rehabilitation

## 2020-09-07 ENCOUNTER — Other Ambulatory Visit: Payer: Self-pay | Admitting: Physical Medicine & Rehabilitation

## 2020-09-12 ENCOUNTER — Other Ambulatory Visit: Payer: Self-pay | Admitting: Physical Medicine & Rehabilitation

## 2020-09-15 ENCOUNTER — Other Ambulatory Visit: Payer: Self-pay | Admitting: Family

## 2020-09-22 ENCOUNTER — Other Ambulatory Visit: Payer: Self-pay | Admitting: Family

## 2020-09-29 ENCOUNTER — Other Ambulatory Visit: Payer: Self-pay | Admitting: Family

## 2020-10-01 ENCOUNTER — Encounter (INDEPENDENT_AMBULATORY_CARE_PROVIDER_SITE_OTHER): Payer: Self-pay | Admitting: Ophthalmology

## 2020-10-01 ENCOUNTER — Other Ambulatory Visit: Payer: Self-pay

## 2020-10-01 ENCOUNTER — Ambulatory Visit (INDEPENDENT_AMBULATORY_CARE_PROVIDER_SITE_OTHER): Payer: Medicare Other | Admitting: Ophthalmology

## 2020-10-01 DIAGNOSIS — H35371 Puckering of macula, right eye: Secondary | ICD-10-CM | POA: Diagnosis not present

## 2020-10-01 DIAGNOSIS — E119 Type 2 diabetes mellitus without complications: Secondary | ICD-10-CM

## 2020-10-01 NOTE — Assessment & Plan Note (Signed)
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  We will continue to monitor and observe  The nature of macular pucker (epiretinal membrane ERM) was discussed with the patient as well as threshold criteria for vitrectomy surgery. I explained that in rare cases another surgery is needed to actually remove a second wrinkle should it regrow.  Most often, the epiretinal membrane and underlying wrinkled internal limiting membrane are removed with the first surgery, to accomplish the goals.   If the operative eye is Phak[place] (natural lens still present), cataract surgery is often recommended prior to Vitrectomy. This will enable the retina surgeon to have the best view during surgery and the patient to obtain optimal results in the future. Treatment options were discussed.

## 2020-10-01 NOTE — Assessment & Plan Note (Signed)
No detectable diabetic retinopathy 

## 2020-10-01 NOTE — Progress Notes (Signed)
10/01/2020     CHIEF COMPLAINT Patient presents for Retina Follow Up (8 week fu OD and OCT/Pt states VA OU stable since last visit. Pt denies FOL, floaters, or ocular pain OU. Karie Mainland: 5.9/LBS: Does not check /)   HISTORY OF PRESENT ILLNESS: [name] is a 75 y.o. male who presents to the clinic today for:   HPI     Retina Follow Up           Diagnosis: Other   Laterality: right eye   Onset: 8 weeks ago   Severity: mild   Duration: 8 weeks   Course: stable   Comments: 8 week fu OD and OCT Pt states VA OU stable since last visit. Pt denies FOL, floaters, or ocular pain OU.  A1C: 5.9 LBS: Does not check           Comments   Patient denies any change in quality of vision in either      Last edited by Hurman Horn, MD on 10/01/2020  9:48 AM.      Referring physician: Monna Fam, MD [place],  [place] [id number]  HISTORICAL INFORMATION:   Selected notes from the MEDICAL RECORD NUMBER    Lab Results  Component Value Date   HGBA1C 5.9 05/04/2020     CURRENT MEDICATIONS: No current outpatient medications on file. (Ophthalmic Drugs)   No current facility-administered medications for this visit. (Ophthalmic Drugs)   Current Outpatient Medications (Other)  Medication Sig   atorvastatin (LIPITOR) 40 MG tablet TAKE 1 TABLET BY MOUTH EVERY DAY   benazepril (LOTENSIN) 10 MG tablet TAKE 1 TABLET (10 MG TOTAL) BY MOUTH DAILY. TAKE WITH ADDITIONAL 5MG TABLET OF BENAZEPRIL DAILY   benazepril (LOTENSIN) 5 MG tablet TAKE 1 TABLET BY MOUTH DAILY WITH 10MG OF BENAZEPRIL TO EQUAL 15MG DAILY   Accu-Chek FastClix Lancets MISC USE TO TEST 3 TIMES DAILY   allopurinol (ZYLOPRIM) 100 MG tablet TAKE 2 TABLETS BY MOUTH EVERY DAY   blood glucose meter kit and supplies KIT Dispense based on patient and insurance preference. Use up to four times daily as directed. (FOR ICD-9 250.00, 250.01).   cyanocobalamin 1000 MCG tablet Take 2,000 mcg by mouth daily.  Take 2 Gummies daily   diclofenac Sodium (VOLTAREN) 1 % GEL USE AS DIRECTED (Patient taking differently: Apply 1 application topically 4 (four) times daily as needed (pain.).)   diltiazem (CARDIZEM CD) 180 MG 24 hr capsule TAKE 1 CAPSULE BY MOUTH EVERY DAY (Patient taking differently: Take 180 mg by mouth daily.)   dofetilide (TIKOSYN) 500 MCG capsule TAKE 1 CAPSULE BY MOUTH TWICE A DAY   doxycycline (VIBRAMYCIN) 100 MG capsule Take 1 capsule (100 mg total) by mouth 2 (two) times daily.   ELIQUIS 5 MG TABS tablet TAKE 1 TABLET BY MOUTH TWICE A DAY   ferrous sulfate 325 (65 FE) MG tablet Take 1 tablet (325 mg total) by mouth 2 (two) times daily with a meal. (Patient taking differently: Take 650 mg by mouth every evening.)   fluticasone (FLONASE) 50 MCG/ACT nasal spray Place 2 sprays into both nostrils as needed for allergies or rhinitis.   folic acid (FOLVITE) 1 MG tablet TAKE 1 TABLET BY MOUTH EVERY DAY   furosemide (LASIX) 40 MG tablet TAKE 1 TABLET (40 MG TOTAL) BY MOUTH DAILY AS NEEDED FOR FLUID OR EDEMA.   gabapentin (NEURONTIN) 100 MG capsule TAKE 1 CAPSULE BY MOUTH AT BEDTIME.   gabapentin (NEURONTIN) 300 MG  capsule Take 1 capsule (300 mg total) by mouth 2 (two) times daily.   glucose blood (ACCU-CHEK GUIDE) test strip USE AS DIRECTED UP TO 4 TIMES DAILY   levothyroxine (SYNTHROID) 112 MCG tablet Take 1 tablet (112 mcg total) by mouth daily before breakfast.   metFORMIN (GLUCOPHAGE) 500 MG tablet Take 1 tablet (500 mg total) by mouth daily with breakfast.   metoprolol succinate (TOPROL-XL) 25 MG 24 hr tablet TAKE 1 TABLET BY MOUTH EVERY DAY (Patient taking differently: Take 25 mg by mouth daily.)   Multiple Vitamins-Minerals (MULTIVITAMIN GUMMIES MENS PO) Take 2 tablets by mouth daily with breakfast.    nitroGLYCERIN (NITROSTAT) 0.4 MG SL tablet Place 1 tablet (0.4 mg total) under the tongue every 5 (five) minutes as needed for chest pain. Up to 3 doses   OXYGEN Inhale 2 L into the lungs at  bedtime.   pantoprazole (PROTONIX) 40 MG tablet Take 1 tablet (40 mg total) by mouth daily before breakfast.   sennosides-docusate sodium (SENOKOT-S) 8.6-50 MG tablet Take 1 tablet by mouth as needed for constipation.   traMADol (ULTRAM) 50 MG tablet TAKE 1 TABLET BY MOUTH TWICE A DAY   traMADol (ULTRAM-ER) 200 MG 24 hr tablet Take 1 tablet (200 mg total) by mouth daily.   No current facility-administered medications for this visit. (Other)      REVIEW OF SYSTEMS:    ALLERGIES No Known Allergies  PAST MEDICAL HISTORY Past Medical History:  Diagnosis Date   Acute encephalopathy 12/12/2015   Acute respiratory failure with hypoxia and hypercapnia (HCC) 12/12/2015   Allergic rhinitis 02/13/2010   Qualifier: Diagnosis of  By: Wynona Luna    Angina at rest [facility]) 10/22/2015   Ataxia 08/10/2013   Atrial fibrillation ([place]) 11/2018   B12 deficiency 08/11/2013   BACK PAIN, LUMBAR 01/29/2009   Qualifier: Diagnosis of  By: Wynona Luna    BENIGN POSITIONAL VERTIGO 11/23/2009   Qualifier: Diagnosis of  By: Wynona Luna    Chronic diastolic heart failure ([place]) 04/24/2015   Chronic pain    left sided-[place]   Coronary 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 ([place]) 11/04/2015   s/p CABG 2004; CFX DES 2011   Essential hypertension 12/12/2006   Qualifier: Diagnosis of  By: [name] RMA, [place]     GERD 12/12/2006   Qualifier: Diagnosis of  By: Reatha Armour, Lucy     GOUT 12/12/2006   Qualifier: Diagnosis of  By: [name] RMA, [place]     Hemorrhoids    Hyperlipidemia LDL goal <70 12/12/2006   Qualifier: Diagnosis of  By: [name] RMA, [place]      Hypogonadism male 04/10/2011   Hypothyroidism, acquired 02/15/2013   Insomnia 05/21/2013   Long term current use of anticoagulant therapy 06/24/2011   Obstructive sleep apnea-Failed CPAP 06/17/2007   Failed CPAP Using O 2 for sleep    Paroxysmal atrial fibrillation ([place]) 12/06/2016   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; resultant  hemiplegia of dominant side   Thoracic aorta atherosclerosis ([place]) 04/27/2017   Tubular adenoma of colon 05/2010   Type 2 diabetes mellitus without complication, without long-term current use of insulin ([place]) 10/05/2007   Qualifier: Diagnosis of  By: Wynona Luna    Vasovagal syncope    Wears glasses    Past Surgical History:  Procedure Laterality Date   CARDIAC CATHETERIZATION N/A 10/25/2015   Procedure: Left Heart Cath and Cors/Grafts Angiography;  Surgeon: Joyice Faster  Claiborne Billings, MD;  Location: [facility] CV LAB;  Service: Cardiovascular;  Laterality: N/A;   CARDIOVERSION  06/20/2011   Procedure: CARDIOVERSION;  Surgeon: Larey Dresser, MD;  Location: [facility];  Service: Cardiovascular;  Laterality: N/A;   CARDIOVERSION N/A 04/26/2015   Procedure: CARDIOVERSION;  Surgeon: Dorothy Spark, MD;  Location: [facility] ENDOSCOPY;  Service: Cardiovascular;  Laterality: N/A;   CARDIOVERSION N/A 05/10/2015   Procedure: CARDIOVERSION;  Surgeon: Larey Dresser, MD;  Location: [facility];  Service: Cardiovascular;  Laterality: N/A;   CARDIOVERSION N/A 12/10/2018   Procedure: CARDIOVERSION;  Surgeon: Sanda Klein, MD;  Location: [facility];  Service: Cardiovascular;  Laterality: N/A;   CATARACT EXTRACTION  05/2010   left eye   CATARACT EXTRACTION  04/2010   right eye   COLONOSCOPY WITH PROPOFOL N/A 02/22/2020   Procedure: COLONOSCOPY WITH PROPOFOL;  Surgeon: Lavena Bullion, DO;  Location: [facility] ENDOSCOPY;  Service: Gastroenterology;  Laterality: N/A;   CORONARY ARTERY BYPASS GRAFT     stent   ESOPHAGOGASTRODUODENOSCOPY  03-25-2005   LEFT HEART CATH AND CORS/GRAFTS ANGIOGRAPHY N/A 02/19/2018   Procedure: LEFT HEART CATH AND CORS/GRAFTS ANGIOGRAPHY;  Surgeon: Martinique, Peter M, MD;  Location: [facility] CV LAB;  Service: Cardiovascular;  Laterality: N/A;   NASAL SEPTUM SURGERY     PERCUTANEOUS PLACEMENT INTRAVASCULAR STENT CERVICAL CAROTID ARTERY     03-2009; using a drug-eluting platform of the  circumflex cornoray artery with a 3.0 x 18 [employer] Promus drug-eluting platform post dilated to 3.75 with a noncompliant balloon.   POLYPECTOMY  02/22/2020   Procedure: POLYPECTOMY;  Surgeon: Lavena Bullion, DO;  Location: [facility] ENDOSCOPY;  Service: Gastroenterology;;   TEE WITHOUT CARDIOVERSION  06/20/2011   Procedure: TRANSESOPHAGEAL ECHOCARDIOGRAM (TEE);  Surgeon: Larey Dresser, MD;  Location[facility];  Service: Cardiovascular;  Laterality: N/A;    FAMILY HISTORY Family History  Problem Relation Age of Onset   Lung cancer Father        deceased   Stroke Mother        deceased-MINISTROKES   Colon cancer Neg Hx    Esophageal cancer Neg Hx    Stomach cancer Neg Hx    Pancreatic cancer Neg Hx    Liver disease Neg Hx     SOCIAL HISTORY Social History   Tobacco Use   Smoking status: Never   Smokeless tobacco: Never  Vaping Use   Vaping Use: Never used  Substance Use Topics   Alcohol use: Not Currently   Drug use: No         OPHTHALMIC EXAM:  Base Eye Exam     Visual Acuity (ETDRS)       Right Left   Dist cc 20/25 -2 20/20   Dist ph cc NI          Tonometry (Tonopen, 9:20 AM)       Right Left   Pressure 13 13         Pupils       Pupils Dark Light Shape React APD   Right PERRL 3 2 Round Brisk None   Left PERRL 3 2 Round Brisk None         Visual Fields (Counting fingers)       Left Right    Full Full         Extraocular Movement       Right Left    Full Full         Neuro/Psych  Oriented x3: Yes         Dilation     Both eyes: 1.0% Mydriacyl, 2.5% Phenylephrine @ 9:20 AM           Slit Lamp and Fundus Exam     External Exam       Right Left   External Normal Normal         Slit Lamp Exam       Right Left   Lids/Lashes Normal Normal   Conjunctiva/Sclera White and quiet White and quiet   Cornea Clear Clear   Anterior Chamber Deep and quiet Deep and quiet   Iris Round and reactive Round and  reactive   Lens Centered posterior chamber intraocular lens Centered posterior chamber intraocular lens   Anterior Vitreous Normal Normal         Fundus Exam       Right Left   Posterior Vitreous Posterior vitreous detachment Posterior vitreous detachment   Disc Normal Normal   C/D Ratio 0.55 0.5   Macula Epiretinal membrane, moderate to severe topographic distortion Normal   Vessels Normal Normal   Periphery Normal Normal            IMAGING AND PROCEDURES  Imaging and Procedures for 10/01/20  OCT, Retina - OU - Both Eyes       Right Eye Quality was good. Scan locations included subfoveal. Central Foveal Thickness: 387. Progression has no prior data. Findings include abnormal foveal contour, epiretinal membrane.   Left Eye Quality was good. Central Foveal Thickness: 288. Progression has no prior data. Findings include normal foveal contour.   Notes Macular thickening right eye with epiretinal membrane giving a pseudocyst of the inner retina and some early inner foveal macular schisis temporally. No interval changes occurred in the right eye as compared to May 2022 OS with minor epiretinal membrane none foveal superonasal, no impact on macular anatomy             ASSESSMENT/PLAN:  Type 2 diabetes mellitus without complication, without long-term current use of insulin (HCC) No detectable diabetic retinopathy  Macular pucker, right eye Moderate topographic distortion of the foveal region yet with no visual acuity impact at this time and stable over last 2 months.  We will continue to monitor and observe  The nature of macular pucker (epiretinal membrane ERM) was discussed with the patient as well as threshold criteria for vitrectomy surgery. I explained that in rare cases another surgery is needed to actually remove a second wrinkle should it regrow.  Most often, the epiretinal membrane and underlying wrinkled internal limiting membrane are removed with the first  surgery, to accomplish the goals.   If the operative eye is [place] (natural lens still present), cataract surgery is often recommended prior to Vitrectomy. This will enable the retina surgeon to have the best view during surgery and the patient to obtain optimal results in the future. Treatment options were discussed.     ICD-10-CM   1. Macular pucker, right eye  H35.371 OCT, Retina - OU - Both Eyes    2. Type 2 diabetes mellitus without complication, without long-term current use of insulin (HCC)  E11.9       1.  45-monthfollow-up OD for significant epiretinal membrane noted yet no impact on acuity and no progression over the short 244-monthollow-up interval.  2.  We will dilate OU next in 6 months  3.  Ophthalmic Meds Ordered this visit:  No orders of the defined types  were placed in this encounter.      Return in about 6 months (around 04/03/2021) for DILATE OU, OCT.  There are no Patient Instructions on file for this visit.   Explained the diagnoses, plan, and follow up with the patient and they expressed understanding.  Patient expressed understanding of the importance of proper follow up care.   Clent Demark Duilio Heritage M.D. Diseases & Surgery of the Retina and Vitreous Retina [facility] Diabetic [place] 10/01/20     Abbreviations: M myopia (nearsighted); A astigmatism; H hyperopia (farsighted); P presbyopia; Mrx spectacle prescription;  CTL contact lenses; OD right eye; OS left eye; OU both eyes  XT exotropia; ET esotropia; PEK punctate epithelial keratitis; PEE punctate epithelial erosions; DES dry eye syndrome; MGD meibomian gland dysfunction; ATs artificial tears; PFAT's preservative free artificial tears; [place] nuclear sclerotic cataract; PSC posterior subcapsular cataract; ERM epi-retinal membrane; PVD posterior vitreous detachment; RD retinal detachment; DM diabetes mellitus; DR diabetic retinopathy; NPDR non-proliferative diabetic retinopathy; PDR proliferative diabetic retinopathy;  CSME clinically significant macular edema; DME diabetic macular edema; dbh dot blot hemorrhages; CWS cotton wool spot; POAG primary open angle glaucoma; C/D cup-to-disc ratio; HVF humphrey visual field; GVF goldmann visual field; OCT optical coherence tomography; IOP intraocular pressure; BRVO Branch retinal vein occlusion; CRVO central retinal vein occlusion; CRAO central retinal artery occlusion; BRAO branch retinal artery occlusion; RT retinal tear; SB scleral buckle; PPV pars plana vitrectomy; VH Vitreous hemorrhage; PRP panretinal laser photocoagulation; IVK intravitreal kenalog; VMT vitreomacular traction; MH Macular hole;  NVD neovascularization of the disc; NVE neovascularization elsewhere; AREDS age related eye disease study; ARMD age related macular degeneration; POAG primary open angle glaucoma; EBMD epithelial/anterior basement membrane dystrophy; ACIOL anterior chamber intraocular lens; IOL intraocular lens; PCIOL posterior chamber intraocular lens; Phaco/IOL phacoemulsification with intraocular lens placement; [place] photorefractive keratectomy; LASIK laser assisted in situ keratomileusis; HTN hypertension; DM diabetes mellitus; COPD chronic obstructive pulmonary disease

## 2020-10-06 ENCOUNTER — Other Ambulatory Visit: Payer: Self-pay | Admitting: Family

## 2020-10-07 ENCOUNTER — Other Ambulatory Visit: Payer: Self-pay | Admitting: Family

## 2020-10-16 ENCOUNTER — Ambulatory Visit: Payer: Medicare Other | Admitting: Physical Medicine & Rehabilitation

## 2020-10-19 ENCOUNTER — Encounter: Payer: Self-pay | Admitting: Physical Medicine & Rehabilitation

## 2020-10-19 ENCOUNTER — Other Ambulatory Visit: Payer: Self-pay

## 2020-10-19 ENCOUNTER — Encounter: Payer: Medicare Other | Attending: Physical Medicine & Rehabilitation | Admitting: Physical Medicine & Rehabilitation

## 2020-10-19 VITALS — BP 115/74 | HR 65 | Temp 98.0°F | Ht 75.0 in | Wt 240.0 lb

## 2020-10-19 DIAGNOSIS — G8112 Spastic hemiplegia affecting left dominant side: Secondary | ICD-10-CM

## 2020-10-19 DIAGNOSIS — G8110 Spastic hemiplegia affecting unspecified side: Secondary | ICD-10-CM | POA: Insufficient documentation

## 2020-10-19 NOTE — Progress Notes (Signed)
Botox Injection for spasticity using needle EMG guidance   Dilution: 50 Units/ml Indication: Severe spasticity which interferes with ADL,mobility and/or  hygiene and is unresponsive to medication management and other conservative care Informed consent was obtained after describing risks and benefits of the procedure with the patient. This includes bleeding, bruising, infection, excessive weakness, or medication side effects. A REMS form is on file and signed. Needle: 27g 1" needle electrode Number of units per muscle    left FDP 25 units  left FDS 25 units left biceps 50 units [id number] All injections were done after obtaining appropriate EMG activity and after negative drawback for blood. The patient tolerated the procedure well. Post procedure instructions were given. A followup appointment was made.

## 2020-11-05 ENCOUNTER — Other Ambulatory Visit: Payer: Self-pay | Admitting: Family

## 2020-11-14 ENCOUNTER — Other Ambulatory Visit: Payer: Self-pay

## 2020-11-14 MED ORDER — METOPROLOL SUCCINATE ER 25 MG PO TB24: 25.0000 mg | ORAL_TABLET | Freq: Every day | ORAL | 0 refills | Status: DC

## 2020-11-23 ENCOUNTER — Other Ambulatory Visit: Payer: Self-pay | Admitting: Physician Assistant

## 2020-11-23 ENCOUNTER — Telehealth: Payer: Self-pay | Admitting: Family

## 2020-11-23 ENCOUNTER — Other Ambulatory Visit: Payer: Self-pay | Admitting: Internal Medicine

## 2020-11-23 NOTE — Telephone Encounter (Signed)
Pt was advised that these were meds prescribed by his cardiologist and they were contacted by their pharmacy. He stated he needed his meds and someone had to refill them. Please advise.    Medication:  1.diltiazem (CARDIZEM CD) 180 MG 24 hr capsule  2.dofetilide (TIKOSYN) 500 MCG capsule  Has the patient contacted their pharmacy? Yes.   (If no, request that the patient contact the pharmacy for the refill.) (If yes, when and what did the pharmacy advise?)  Preferred Pharmacy (with phone number or street name):  [id number] [place]  [address] [place] Phone:  [phone] Fax:  [phone]  Agent: Please be advised that RX refills may take up to 3 business days. We ask that you follow-up with your pharmacy.

## 2020-11-24 ENCOUNTER — Encounter (HOSPITAL_COMMUNITY): Payer: Self-pay

## 2020-11-24 ENCOUNTER — Emergency Department (HOSPITAL_COMMUNITY): Payer: Medicare Other

## 2020-11-24 ENCOUNTER — Inpatient Hospital Stay (HOSPITAL_COMMUNITY)
Admission: EM | Admit: 2020-11-24 | Discharge: 2020-11-27 | DRG: 178 | Disposition: A | Discharge status: Home / Self Care | Payer: Medicare Other | Attending: Internal Medicine | Admitting: Internal Medicine

## 2020-11-24 ENCOUNTER — Other Ambulatory Visit: Payer: Self-pay

## 2020-11-24 DIAGNOSIS — D649 Anemia, unspecified: Secondary | ICD-10-CM | POA: Diagnosis not present

## 2020-11-24 DIAGNOSIS — I69351 Hemiplegia and hemiparesis following cerebral infarction affecting right dominant side: Secondary | ICD-10-CM

## 2020-11-24 DIAGNOSIS — R531 Weakness: Secondary | ICD-10-CM | POA: Diagnosis not present

## 2020-11-24 DIAGNOSIS — R0902 Hypoxemia: Secondary | ICD-10-CM | POA: Diagnosis not present

## 2020-11-24 DIAGNOSIS — Z7901 Long term (current) use of anticoagulants: Secondary | ICD-10-CM

## 2020-11-24 DIAGNOSIS — Z79890 Hormone replacement therapy (postmenopausal): Secondary | ICD-10-CM

## 2020-11-24 DIAGNOSIS — I480 Paroxysmal atrial fibrillation: Secondary | ICD-10-CM | POA: Diagnosis present

## 2020-11-24 DIAGNOSIS — Z823 Family history of stroke: Secondary | ICD-10-CM

## 2020-11-24 DIAGNOSIS — G9340 Encephalopathy, unspecified: Secondary | ICD-10-CM

## 2020-11-24 DIAGNOSIS — Z9861 Coronary angioplasty status: Secondary | ICD-10-CM

## 2020-11-24 DIAGNOSIS — G894 Chronic pain syndrome: Secondary | ICD-10-CM | POA: Diagnosis present

## 2020-11-24 DIAGNOSIS — R54 Age-related physical debility: Secondary | ICD-10-CM | POA: Diagnosis not present

## 2020-11-24 DIAGNOSIS — E039 Hypothyroidism, unspecified: Secondary | ICD-10-CM | POA: Diagnosis present

## 2020-11-24 DIAGNOSIS — K219 Gastro-esophageal reflux disease without esophagitis: Secondary | ICD-10-CM | POA: Diagnosis present

## 2020-11-24 DIAGNOSIS — E119 Type 2 diabetes mellitus without complications: Secondary | ICD-10-CM

## 2020-11-24 DIAGNOSIS — M109 Gout, unspecified: Secondary | ICD-10-CM | POA: Diagnosis present

## 2020-11-24 DIAGNOSIS — E785 Hyperlipidemia, unspecified: Secondary | ICD-10-CM | POA: Diagnosis present

## 2020-11-24 DIAGNOSIS — I110 Hypertensive heart disease with heart failure: Secondary | ICD-10-CM | POA: Diagnosis not present

## 2020-11-24 DIAGNOSIS — Z6830 Body mass index (BMI) 30.0-30.9, adult: Secondary | ICD-10-CM

## 2020-11-24 DIAGNOSIS — I2510 Atherosclerotic heart disease of native coronary artery without angina pectoris: Secondary | ICD-10-CM

## 2020-11-24 DIAGNOSIS — R0789 Other chest pain: Secondary | ICD-10-CM | POA: Diagnosis not present

## 2020-11-24 DIAGNOSIS — Z7984 Long term (current) use of oral hypoglycemic drugs: Secondary | ICD-10-CM

## 2020-11-24 DIAGNOSIS — I5032 Chronic diastolic (congestive) heart failure: Secondary | ICD-10-CM | POA: Diagnosis present

## 2020-11-24 DIAGNOSIS — Z951 Presence of aortocoronary bypass graft: Secondary | ICD-10-CM

## 2020-11-24 DIAGNOSIS — K649 Unspecified hemorrhoids: Secondary | ICD-10-CM | POA: Diagnosis present

## 2020-11-24 DIAGNOSIS — W19XXXA Unspecified fall, initial encounter: Secondary | ICD-10-CM | POA: Diagnosis not present

## 2020-11-24 DIAGNOSIS — E669 Obesity, unspecified: Secondary | ICD-10-CM | POA: Diagnosis present

## 2020-11-24 DIAGNOSIS — I10 Essential (primary) hypertension: Secondary | ICD-10-CM | POA: Diagnosis present

## 2020-11-24 DIAGNOSIS — Z801 Family history of malignant neoplasm of trachea, bronchus and lung: Secondary | ICD-10-CM

## 2020-11-24 DIAGNOSIS — E66811 Obesity, class 1: Secondary | ICD-10-CM | POA: Diagnosis present

## 2020-11-24 DIAGNOSIS — U071 COVID-19: Principal | ICD-10-CM | POA: Diagnosis present

## 2020-11-24 DIAGNOSIS — Z79899 Other long term (current) drug therapy: Secondary | ICD-10-CM

## 2020-11-24 DIAGNOSIS — G4733 Obstructive sleep apnea (adult) (pediatric): Secondary | ICD-10-CM | POA: Diagnosis present

## 2020-11-24 LAB — CBC WITH DIFFERENTIAL/PLATELET
Abs Immature Granulocytes: 0.02 10*3/uL (ref 0.00–0.07)
Basophils Absolute: 0.1 10*3/uL (ref 0.0–0.1)
Basophils Relative: 1 %
Eosinophils Absolute: 0.1 10*3/uL (ref 0.0–0.5)
Eosinophils Relative: 1 %
HCT: 40.7 % (ref 39.0–52.0)
Hemoglobin: 12.9 g/dL — ABNORMAL LOW (ref 13.0–17.0)
Immature Granulocytes: 0 %
Lymphocytes Relative: 9 %
Lymphs Abs: 0.7 10*3/uL (ref 0.7–4.0)
MCH: 30.6 pg (ref 26.0–34.0)
MCHC: 31.7 g/dL (ref 30.0–36.0)
MCV: 96.4 fL (ref 80.0–100.0)
Monocytes Absolute: 0.7 10*3/uL (ref 0.1–1.0)
Monocytes Relative: 10 %
Neutro Abs: 6 10*3/uL (ref 1.7–7.7)
Neutrophils Relative %: 79 %
Platelets: 135 10*3/uL — ABNORMAL LOW (ref 150–400)
RBC: 4.22 MIL/uL (ref 4.22–5.81)
RDW: 13.2 % (ref 11.5–15.5)
WBC: 7.7 10*3/uL (ref 4.0–10.5)
nRBC: 0 % (ref 0.0–0.2)

## 2020-11-24 LAB — COMPREHENSIVE METABOLIC PANEL
ALT: 30 U/L (ref 0–44)
AST: 48 U/L — ABNORMAL HIGH (ref 15–41)
Albumin: 3.7 g/dL (ref 3.5–5.0)
Alkaline Phosphatase: 47 U/L (ref 38–126)
Anion gap: 7 (ref 5–15)
BUN: 14 mg/dL (ref 8–23)
CO2: 29 mmol/L (ref 22–32)
Calcium: 9.2 mg/dL (ref 8.9–10.3)
Chloride: 102 mmol/L (ref 98–111)
Creatinine, Ser: 1.19 mg/dL (ref 0.61–1.24)
GFR, Estimated: >60 mL/min (ref >60)
Glucose, Bld: 113 mg/dL — ABNORMAL HIGH (ref 70–99)
Potassium: 5.4 mmol/L — ABNORMAL HIGH (ref 3.5–5.1)
Sodium: 138 mmol/L (ref 135–145)
Total Bilirubin: 0.9 mg/dL (ref 0.3–1.2)
Total Protein: 6.7 g/dL (ref 6.5–8.1)

## 2020-11-24 LAB — PROTIME-INR
INR: 1.3 — ABNORMAL HIGH (ref 0.8–1.2)
Prothrombin Time: 16.3 seconds — ABNORMAL HIGH (ref 11.4–15.2)

## 2020-11-24 LAB — APTT: aPTT: 31 seconds (ref 24–36)

## 2020-11-24 LAB — LACTIC ACID, PLASMA: Lactic Acid, Venous: 1.6 mmol/L (ref 0.5–1.9)

## 2020-11-24 MED ORDER — ACETAMINOPHEN 500 MG PO TABS
1000.0000 mg | ORAL_TABLET | Freq: Once | ORAL | Status: AC
  Administered 2020-11-24: 1000 mg via ORAL
  Filled 2020-11-24: qty 2

## 2020-11-24 MED ORDER — LACTATED RINGERS IV BOLUS
1000.0000 mL | Freq: Once | INTRAVENOUS | Status: AC
  Administered 2020-11-24: 1000 mL via INTRAVENOUS

## 2020-11-24 NOTE — Telephone Encounter (Signed)
These are both cardiac medications. I will forward to his cardiology team.

## 2020-11-24 NOTE — ED Notes (Signed)
Granddaughter Mervin Kung [phone] would like an update

## 2020-11-24 NOTE — ED Triage Notes (Signed)
Patient's oxygen saturation was 88-90 on room air, placed on one liter oxygen, saturations are 98%

## 2020-11-24 NOTE — ED Triage Notes (Addendum)
Patient states he has been feeling "peaked" since yesterday. EMS states he denied any complaints for them but family insisted he come to the hospital. Patient did not want to come. Wife had stated patient has been having more trouble getting out of bed. Patient has history of CVA with left-sided deficits.

## 2020-11-24 NOTE — ED Provider Notes (Signed)
[facility] EMERGENCY DEPARTMENT Provider Note   CSN: [id number] Arrival date & time: 11/24/20  2208     History Chief Complaint  Patient presents with   Weakness    [name] is a 75 y.o. male.  HPI Patient is a 75 year old male with a history of atrial fibrillation, essential hypertension, hyperlipidemia, acute respiratory failure with hypoxia and hypercapnia, acute encephalopathy, CVA, who presents to the emergency department due to generalized weakness.  Per EMS notes, family requested the patient be brought to the hospital for evaluation.  His wife noted EMS that his been having more trouble getting out of bed and appeared weaker than normal.  Patient states "I feel fine".  Denies any symptoms at this time.  Denies a history of known previous COVID-19 infection and states that he has been vaccinated for COVID-19 x4.  I spoke to the patient's granddaughter who states that over the past 2 days he has been coughing more than normal.  He appears more fatigued.  He went to take a nap today and called out to his wife because he was too weak to get out of bed.  He had urinated and defecated on himself.  She states that he normally does not have difficulty with his ADLs and does not ambulate with a walker.  She states that he has an oxygen requirement at night but typically does not wear oxygen during the day.    Past Medical History:  Diagnosis Date   Acute encephalopathy 12/12/2015   Acute respiratory failure with hypoxia and hypercapnia (HCC) 12/12/2015   Allergic rhinitis 02/13/2010   Qualifier: Diagnosis of  By: Wynona Luna    Angina at rest [facility]) 10/22/2015   Ataxia 08/10/2013   Atrial fibrillation ([place]) 11/2018   B12 deficiency 08/11/2013   BACK PAIN, LUMBAR 01/29/2009   Qualifier: Diagnosis of  By: Wynona Luna    BENIGN POSITIONAL VERTIGO 11/23/2009   Qualifier: Diagnosis of  By: Wynona Luna    Chronic diastolic heart failure ([place]) 04/24/2015    Chronic pain    left sided-[place]   Coronary 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 ([place]) 11/04/2015   s/p CABG 2004; CFX DES 2011   Essential hypertension 12/12/2006   Qualifier: Diagnosis of  By: [name] RMA, [place]     GERD 12/12/2006   Qualifier: Diagnosis of  By: Reatha Armour, [place]     GOUT 12/12/2006   Qualifier: Diagnosis of  By: [name] RMA, [place]     Hemorrhoids    Hyperlipidemia LDL goal <70 12/12/2006   Qualifier: Diagnosis of  By: [name] RMA, [place]      Hypogonadism male 04/10/2011   Hypothyroidism, acquired 02/15/2013   Insomnia 05/21/2013   Long term current use of anticoagulant therapy 06/24/2011   Obstructive sleep apnea-Failed CPAP 06/17/2007   Failed CPAP Using O 2 for sleep    Paroxysmal atrial fibrillation ([place]) 12/06/2016   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   also 1996; resultant hemiplegia of dominant side   Thoracic aorta atherosclerosis ([place]) 04/27/2017   Tubular adenoma of colon 05/2010   Type 2 diabetes mellitus without complication, without long-term current use of insulin ([place]) 10/05/2007   Qualifier: Diagnosis of  By: Wynona Luna    Vasovagal syncope    Wears glasses     Patient Active Problem List   Diagnosis Date Noted   Macular pucker, right eye 08/06/2020   Hx of adenomatous colonic polyps  Adenomatous polyp of ascending colon    Skin tag of anus    Rectal varices    Primary osteoarthritis of first carpometacarpal joint of right hand 08/09/2019   Secondary hypercoagulable state ([place]) 08/02/2019   External hemorrhoid 05/19/2019   Chest wall hematoma 12/16/2018   Delirium 12/16/2018   Blood loss anemia 12/16/2018   Dysuria 12/16/2018   Acute encephalopathy 12/16/2018   Acute on chronic diastolic heart failure (HCC)    Anticoagulated on warfarin    Elevated troponin    Congestive heart failure (HCC)    Abnormal INR    AKI (acute kidney injury) ([place])    History of gout    History of hypothyroidism    History of type 2 diabetes  mellitus    Atrial fibrillation with rapid ventricular response ([place]) 11/27/2018   Ischemic chest pain ([place]) 02/17/2018   Pure hypercholesterolemia 05/01/2017   Thoracic aorta atherosclerosis ([place]) 04/27/2017   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   RUQ abdominal pain 01/13/2017   Paroxysmal atrial fibrillation ([place]) 12/06/2016   Dyspnea on exertion 10/21/2016   Acute respiratory failure with hypoxia and hypercapnia ([place]) 12/12/2015   Coronary 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 ([place]) 11/04/2015   CAD S/P CFX DES 2011 04/25/2015   Longstanding persistent atrial fibrillation ([place]) 04/25/2015   Vasovagal syncope    Atypical chest pain 04/24/2015   Constipation 04/24/2015   Chronic diastolic heart failure ([place]) 04/24/2015   B12 deficiency 08/11/2013   Sensory disturbance 08/11/2013   Ataxia 08/10/2013   Laceration of ear, external, right 07/05/2013   Insomnia 05/21/2013   Hypothyroidism, acquired 02/15/2013   Weakness 11/17/2012   Spastic hemiplegia affecting dominant side ([place]) 07/21/2011   Long term current use of anticoagulant therapy 06/24/2011   Hypogonadism male 04/10/2011   Fatigue 03/31/2011   Allergic rhinitis 02/13/2010   BENIGN POSITIONAL VERTIGO 11/23/2009   Hx of CABG '04 05/04/2009   Hypothyroidism 04/02/2009   Back pain 01/29/2009   Type 2 diabetes mellitus without complication, without long-term current use of insulin ([place]) 10/05/2007   Obstructive sleep apnea-Failed CPAP 06/17/2007   Hyperlipidemia LDL goal <70 12/12/2006   GOUT 12/12/2006   Essential hypertension 12/12/2006   History of stroke with residual deficit 12/12/2006   GERD 12/12/2006    Past Surgical History:  Procedure Laterality Date   CARDIAC CATHETERIZATION N/A 10/25/2015   Procedure: Left Heart Cath and Cors/Grafts Angiography;  Surgeon: Troy Sine, MD;  Location: [place] [facility];  Service: Cardiovascular;  Laterality: N/A;   CARDIOVERSION  06/20/2011   Procedure:  CARDIOVERSION;  Surgeon: Larey Dresser, MD;  Location: [place];  Service: Cardiovascular;  Laterality: N/A;   CARDIOVERSION N/A 04/26/2015   Procedure: CARDIOVERSION;  Surgeon: Dorothy Spark, MD;  Location: [facility] ENDOSCOPY;  Service: Cardiovascular;  Laterality: N/A;   CARDIOVERSION N/A 05/10/2015   Procedure: CARDIOVERSION;  Surgeon: Larey Dresser, MD;  Location: [place];  Service: Cardiovascular;  Laterality: N/A;   CARDIOVERSION N/A 12/10/2018   Procedure: CARDIOVERSION;  Surgeon: Sanda Klein, MD;  Location: [place];  Service: Cardiovascular;  Laterality: N/A;   CATARACT EXTRACTION  05/2010   left eye   CATARACT EXTRACTION  04/2010   right eye   COLONOSCOPY WITH PROPOFOL N/A 02/22/2020   Procedure: COLONOSCOPY WITH PROPOFOL;  Surgeon: Lavena Bullion, DO;  Location: [facility] ENDOSCOPY;  Service: Gastroenterology;  Laterality: N/A;   CORONARY ARTERY BYPASS GRAFT     stent   ESOPHAGOGASTRODUODENOSCOPY  03-25-2005  LEFT HEART CATH AND CORS/GRAFTS ANGIOGRAPHY N/A 02/19/2018   Procedure: LEFT HEART CATH AND CORS/GRAFTS ANGIOGRAPHY;  Surgeon: Martinique, Peter M, MD;  Location: [place] [facility];  Service: Cardiovascular;  Laterality: N/A;   NASAL SEPTUM SURGERY     PERCUTANEOUS PLACEMENT INTRAVASCULAR STENT CERVICAL CAROTID ARTERY     03-2009; using a drug-eluting platform of the circumflex cornoray artery with a 3.0 x 18 [employer] Promus drug-eluting platform post dilated to 3.75 with a noncompliant balloon.   POLYPECTOMY  02/22/2020   Procedure: POLYPECTOMY;  Surgeon: Lavena Bullion, DO;  Location: [facility] ENDOSCOPY;  Service: Gastroenterology;;   TEE WITHOUT CARDIOVERSION  06/20/2011   Procedure: TRANSESOPHAGEAL ECHOCARDIOGRAM (TEE);  Surgeon: Larey Dresser, MD;  Location: [facility] ENDOSCOPY;  Service: Cardiovascular;  Laterality: N/A;       Family History  Problem Relation Age of Onset   Lung cancer Father        deceased   Stroke Mother        deceased-MINISTROKES    Colon cancer Neg Hx    Esophageal cancer Neg Hx    Stomach cancer Neg Hx    Pancreatic cancer Neg Hx    Liver disease Neg Hx     Social History   Tobacco Use   Smoking status: Never   Smokeless tobacco: Never  Vaping Use   Vaping Use: Never used  Substance Use Topics   Alcohol use: Not Currently   Drug use: No    Home Medications Prior to Admission medications   Medication Sig Start Date End Date Taking? Authorizing Provider  Accu-Chek FastClix Lancets MISC USE TO TEST 3 TIMES DAILY 06/07/19   Isaiah Serge, NP  allopurinol (ZYLOPRIM) 100 MG tablet TAKE 2 TABLETS BY MOUTH EVERY DAY 11/05/20   Debbrah Alar, NP  atorvastatin (LIPITOR) 40 MG tablet TAKE 1 TABLET BY MOUTH EVERY DAY 09/30/20   Debbrah Alar, NP  benazepril (LOTENSIN) 10 MG tablet TAKE 1 TABLET (10 MG TOTAL) BY MOUTH DAILY. TAKE WITH ADDITIONAL 5MG TABLET OF BENAZEPRIL DAILY 09/30/20   Debbrah Alar, NP  benazepril (LOTENSIN) 5 MG tablet TAKE 1 TABLET BY MOUTH DAILY WITH 10MG OF BENAZEPRIL TO EQUAL 15MG DAILY 09/30/20   Debbrah Alar, NP  blood glucose meter kit and supplies KIT Dispense based on patient and insurance preference. Use up to four times daily as directed. (FOR ICD-9 250.00, 250.01). 01/26/19   Isaiah Serge, NP  cyanocobalamin 1000 MCG tablet Take 2,000 mcg by mouth daily. Take 2 Gummies daily    [provider]  diclofenac Sodium (VOLTAREN) 1 % GEL USE AS DIRECTED Patient taking differently: Apply 1 application topically 4 (four) times daily as needed (pain.). 08/29/19   Leandrew Koyanagi, MD  diltiazem (CARDIZEM CD) 180 MG 24 hr capsule TAKE 1 CAPSULE BY MOUTH EVERY DAY Patient taking differently: Take 180 mg by mouth daily. 02/03/20   Deboraha Sprang, MD  dofetilide (TIKOSYN) 500 MCG capsule TAKE 1 CAPSULE BY MOUTH TWICE A DAY 06/27/20   Baldwin Jamaica, PA-C  doxycycline (VIBRAMYCIN) 100 MG capsule Take 1 capsule (100 mg total) by mouth 2 (two) times daily. 08/07/20   Pearson Forster, NP  ELIQUIS 5 MG TABS tablet TAKE 1 TABLET BY MOUTH TWICE A DAY 06/18/20   Jerline Pain, MD  ferrous sulfate 325 (65 FE) MG tablet Take 1 tablet (325 mg total) by mouth 2 (two) times daily with a meal. Patient taking differently: Take 650 mg by mouth  every evening. 05/25/19   Debbrah Alar, NP  fluticasone (FLONASE) 50 MCG/ACT nasal spray Place 2 sprays into both nostrils as needed for allergies or rhinitis. 04/14/19   Isaiah Serge, NP  folic acid (FOLVITE) 1 MG tablet TAKE 1 TABLET BY MOUTH EVERY DAY 09/24/20   Debbrah Alar, NP  furosemide (LASIX) 40 MG tablet TAKE 1 TABLET (40 MG TOTAL) BY MOUTH DAILY AS NEEDED FOR FLUID OR EDEMA. 09/24/20   Debbrah Alar, NP  gabapentin (NEURONTIN) 100 MG capsule TAKE 1 CAPSULE BY MOUTH AT BEDTIME. 09/16/20   Debbrah Alar, NP  gabapentin (NEURONTIN) 300 MG capsule Take 1 capsule (300 mg total) by mouth 2 (two) times daily. 07/19/20   Kirsteins, Luanna Salk, MD  glucose blood (ACCU-CHEK GUIDE) test strip USE AS DIRECTED UP TO 4 TIMES DAILY 03/21/19   Debbrah Alar, NP  levothyroxine (SYNTHROID) 112 MCG tablet TAKE 1 TABLET BY MOUTH DAILY BEFORE BREAKFAST. 10/08/20   Debbrah Alar, NP  metFORMIN (GLUCOPHAGE) 500 MG tablet TAKE 1 TABLET BY MOUTH EVERY DAY WITH BREAKFAST 10/08/20   Debbrah Alar, NP  metoprolol succinate (TOPROL-XL) 25 MG 24 hr tablet Take 1 tablet (25 mg total) by mouth daily. Please make overdue appt with Dr. Caryl Comes before anymore refills. Thank you 1st attempt 11/14/20   Deboraha Sprang, MD  Multiple Vitamins-Minerals (MULTIVITAMIN GUMMIES MENS PO) Take 2 tablets by mouth daily with breakfast.     [provider]  nitroGLYCERIN (NITROSTAT) 0.4 MG SL tablet Place 1 tablet (0.4 mg total) under the tongue every 5 (five) minutes as needed for chest pain. Up to 3 doses 03/05/15   Debbrah Alar, NP  OXYGEN Inhale 2 L into the lungs at bedtime.    [provider]  pantoprazole (PROTONIX) 40  MG tablet Take 1 tablet (40 mg total) by mouth daily before breakfast. 05/23/20   Debbrah Alar, NP  sennosides-docusate sodium (SENOKOT-S) 8.6-50 MG tablet Take 1 tablet by mouth as needed for constipation.    [provider]  traMADol (ULTRAM) 50 MG tablet TAKE 1 TABLET BY MOUTH TWICE A DAY 09/13/20   Kirsteins, Luanna Salk, MD  traMADol (ULTRAM-ER) 200 MG 24 hr tablet Take 1 tablet (200 mg total) by mouth daily. 09/04/20   Kirsteins, Luanna Salk, MD    Allergies    Patient has no known allergies.  Review of Systems   Review of Systems  All other systems reviewed and are negative. Ten systems reviewed and are negative for acute change, except as noted in the HPI.   Physical Exam Updated Vital Signs BP 136/69   Pulse 79   Temp (!) 101.5 F (38.6 C) (Oral)   Resp 16   Ht _0  (1.905 m)   Wt 109 kg   SpO2 98%   BMI 30.04 kg/m   Physical Exam Vitals and nursing note reviewed.  Constitutional:      General: He is not in acute distress.    Appearance: Normal appearance. He is not ill-appearing, toxic-appearing or diaphoretic.  HENT:     Head: Normocephalic and atraumatic.     Right Ear: External ear normal.     Left Ear: External ear normal.     Nose: Nose normal.     Mouth/Throat:     Mouth: Mucous membranes are moist.     Pharynx: Oropharynx is clear. No oropharyngeal exudate or posterior oropharyngeal erythema.  Eyes:     Extraocular Movements: Extraocular movements intact.  Cardiovascular:     Rate and  Rhythm: Normal rate and regular rhythm.     Pulses: Normal pulses.     Heart sounds: Normal heart sounds. No murmur heard.   No friction rub. No gallop.  Pulmonary:     Effort: Pulmonary effort is normal. No respiratory distress.     Breath sounds: Normal breath sounds. No stridor. No wheezing, rhonchi or rales.     Comments: Lungs are clear to auscultation bilaterally.  Oxygen saturations in the high 80s on room air so patient was placed on 1 L via nasal cannula  with improvement to the high 90s. Abdominal:     General: Abdomen is flat.     Palpations: Abdomen is soft.     Tenderness: There is no abdominal tenderness.  Musculoskeletal:        General: Normal range of motion.     Cervical back: Normal range of motion and neck supple. No tenderness.  Skin:    General: Skin is warm and dry.  Neurological:     General: No focal deficit present.     Mental Status: He is alert and oriented to person, place, and time.  Psychiatric:        Mood and Affect: Mood normal.        Behavior: Behavior normal.   ED Results / Procedures / Treatments   Labs (all labs ordered are listed, but only abnormal results are displayed) Labs Reviewed  CBC WITH DIFFERENTIAL/PLATELET - Abnormal; Notable for the following components:      Result Value   Hemoglobin 12.9 (*)    Platelets 135 (*)    All other components within normal limits  PROTIME-INR - Abnormal; Notable for the following components:   Prothrombin Time 16.3 (*)    INR 1.3 (*)    All other components within normal limits  URINE CULTURE  CULTURE, BLOOD (ROUTINE X 2)  CULTURE, BLOOD (ROUTINE X 2)  RESP PANEL BY RT-PCR (FLU A&B, COVID) ARPGX2  APTT  LACTIC ACID, PLASMA  LACTIC ACID, PLASMA  COMPREHENSIVE METABOLIC PANEL  URINALYSIS, ROUTINE W REFLEX MICROSCOPIC   EKG EKG Interpretation  Date/Time:  Saturday November 24 2020 23:08:52 EDT Ventricular Rate:  74 PR Interval:  201 QRS Duration: 126 QT Interval:  398 QTC Calculation: 442 R Axis:   -57 Text Interpretation: Sinus rhythm Nonspecific IVCD with LAD No significant change was found Confirmed by Ezequiel Essex [phone]) on 11/24/2020 11:18:25 PM  Radiology DG Chest Portable 1 View  Result Date: 11/24/2020 CLINICAL DATA:  Weakness, hypoxia EXAM: PORTABLE CHEST 1 VIEW COMPARISON:  12/14/2018 FINDINGS: Lungs are clear.  No pleural effusion or pneumothorax. Eventration of the right hemidiaphragm. The heart is top-normal in size.  Postsurgical changes related to prior CABG. Median sternotomy. IMPRESSION: No evidence of acute cardiopulmonary disease. Electronically Signed   By: Julian Hy M.D.   On: 11/24/2020 23:04    Procedures Procedures   Medications Ordered in ED Medications  acetaminophen (TYLENOL) tablet 1,000 mg (1,000 mg Oral Given 11/24/20 2313)  lactated ringers bolus 1,000 mL (1,000 mLs Intravenous New Bag/Given 11/24/20 2314)    ED Course  I have reviewed the triage vital signs and the nursing notes.  Pertinent labs & imaging results that were available during my care of the patient were reviewed by me and considered in my medical decision making (see chart for details).    MDM Rules/Calculators/A&P  Pt is a 75 y.o. male who presents to the emergency department via EMS due to worsening weakness over the past 2 days.  Labs: CBC with a hemoglobin of 12.9 and platelets of 135. CMP with potassium of 5.4, glucose 113, AST of 48. Prothrombin time 16.3 with an INR of 1.3. APTT of 31. Lactic acid 1.6. Patient pending UA and respiratory panel. Blood cultures obtained.  Imaging: Chest x-ray is negative.  I, Rayna Sexton, PA-C, personally reviewed and evaluated these images and lab results as part of my medical decision-making.  Patient with worsening weakness over the past 2 days.  His granddaughter notes that he has been coughing more than normal and today was unable to get out of bed and defecated on himself while in bed.  She states this is very abnormal for him.  He has an oxygen requirement at night but upon arrival was found to be febrile at 101.5 F and had desaturated into the high 80s.  Saturations improved with 1 L via nasal cannula.  Patient denying any complaints at this time.  Patient pending remaining lab work.  He was given Tylenol for his fever as well as 1 L of lactated Ringer's.  It is the end of my shift and patient care is being transitioned to Dr.  Wyvonnia Dusky.  Disposition pending based on remaining lab work.  Note: Portions of this report may have been transcribed using voice recognition software. Every effort was made to ensure accuracy; however, inadvertent computerized transcription errors may be present.   Final Clinical Impression(s) / ED Diagnoses Final diagnoses:  Weakness   Rx / DC Orders ED Discharge Orders     None        Rayna Sexton, PA-C 11/25/20 0005    Ezequiel Essex, MD 11/25/20 [phone]

## 2020-11-25 ENCOUNTER — Observation Stay (HOSPITAL_COMMUNITY): Payer: Medicare Other

## 2020-11-25 DIAGNOSIS — Z6830 Body mass index (BMI) 30.0-30.9, adult: Secondary | ICD-10-CM | POA: Diagnosis not present

## 2020-11-25 DIAGNOSIS — I69351 Hemiplegia and hemiparesis following cerebral infarction affecting right dominant side: Secondary | ICD-10-CM | POA: Diagnosis not present

## 2020-11-25 DIAGNOSIS — E119 Type 2 diabetes mellitus without complications: Secondary | ICD-10-CM | POA: Diagnosis present

## 2020-11-25 DIAGNOSIS — D649 Anemia, unspecified: Secondary | ICD-10-CM | POA: Diagnosis present

## 2020-11-25 DIAGNOSIS — E039 Hypothyroidism, unspecified: Secondary | ICD-10-CM | POA: Diagnosis present

## 2020-11-25 DIAGNOSIS — G894 Chronic pain syndrome: Secondary | ICD-10-CM | POA: Diagnosis present

## 2020-11-25 DIAGNOSIS — R0902 Hypoxemia: Secondary | ICD-10-CM | POA: Diagnosis present

## 2020-11-25 DIAGNOSIS — I110 Hypertensive heart disease with heart failure: Secondary | ICD-10-CM | POA: Diagnosis present

## 2020-11-25 DIAGNOSIS — U071 COVID-19: Secondary | ICD-10-CM

## 2020-11-25 DIAGNOSIS — G4733 Obstructive sleep apnea (adult) (pediatric): Secondary | ICD-10-CM | POA: Diagnosis present

## 2020-11-25 DIAGNOSIS — Z801 Family history of malignant neoplasm of trachea, bronchus and lung: Secondary | ICD-10-CM | POA: Diagnosis not present

## 2020-11-25 DIAGNOSIS — E785 Hyperlipidemia, unspecified: Secondary | ICD-10-CM | POA: Diagnosis present

## 2020-11-25 DIAGNOSIS — I10 Essential (primary) hypertension: Secondary | ICD-10-CM | POA: Diagnosis not present

## 2020-11-25 DIAGNOSIS — Z951 Presence of aortocoronary bypass graft: Secondary | ICD-10-CM | POA: Diagnosis not present

## 2020-11-25 DIAGNOSIS — I2510 Atherosclerotic heart disease of native coronary artery without angina pectoris: Secondary | ICD-10-CM | POA: Diagnosis present

## 2020-11-25 DIAGNOSIS — E669 Obesity, unspecified: Secondary | ICD-10-CM | POA: Diagnosis present

## 2020-11-25 DIAGNOSIS — I480 Paroxysmal atrial fibrillation: Secondary | ICD-10-CM | POA: Diagnosis present

## 2020-11-25 DIAGNOSIS — Z7901 Long term (current) use of anticoagulants: Secondary | ICD-10-CM | POA: Diagnosis not present

## 2020-11-25 DIAGNOSIS — M109 Gout, unspecified: Secondary | ICD-10-CM | POA: Diagnosis present

## 2020-11-25 DIAGNOSIS — I5032 Chronic diastolic (congestive) heart failure: Secondary | ICD-10-CM | POA: Diagnosis present

## 2020-11-25 DIAGNOSIS — Z823 Family history of stroke: Secondary | ICD-10-CM | POA: Diagnosis not present

## 2020-11-25 DIAGNOSIS — E66811 Obesity, class 1: Secondary | ICD-10-CM | POA: Diagnosis present

## 2020-11-25 DIAGNOSIS — R54 Age-related physical debility: Secondary | ICD-10-CM | POA: Diagnosis present

## 2020-11-25 DIAGNOSIS — Z79899 Other long term (current) drug therapy: Secondary | ICD-10-CM | POA: Diagnosis not present

## 2020-11-25 DIAGNOSIS — K649 Unspecified hemorrhoids: Secondary | ICD-10-CM | POA: Diagnosis present

## 2020-11-25 DIAGNOSIS — K219 Gastro-esophageal reflux disease without esophagitis: Secondary | ICD-10-CM | POA: Diagnosis present

## 2020-11-25 HISTORY — DX: COVID-19: U07.1

## 2020-11-25 LAB — MAGNESIUM: Magnesium: 1.9 mg/dL (ref 1.7–2.4)

## 2020-11-25 LAB — BASIC METABOLIC PANEL
Anion gap: 9 (ref 5–15)
BUN: 14 mg/dL (ref 8–23)
CO2: 27 mmol/L (ref 22–32)
Calcium: 8.9 mg/dL (ref 8.9–10.3)
Chloride: 102 mmol/L (ref 98–111)
Creatinine, Ser: 1.13 mg/dL (ref 0.61–1.24)
GFR, Estimated: >60 mL/min (ref >60)
Glucose, Bld: 116 mg/dL — ABNORMAL HIGH (ref 70–99)
Potassium: 4.4 mmol/L (ref 3.5–5.1)
Sodium: 138 mmol/L (ref 135–145)

## 2020-11-25 LAB — CBG MONITORING, ED
Glucose-Capillary: 160 mg/dL — ABNORMAL HIGH (ref 70–99)
Glucose-Capillary: 181 mg/dL — ABNORMAL HIGH (ref 70–99)
Glucose-Capillary: 160 mg/dL — ABNORMAL HIGH (ref 70–99)

## 2020-11-25 LAB — URINALYSIS, ROUTINE W REFLEX MICROSCOPIC
Bilirubin Urine: NEGATIVE
Glucose, UA: NEGATIVE mg/dL
Hgb urine dipstick: NEGATIVE
Ketones, ur: NEGATIVE mg/dL
Leukocytes,Ua: NEGATIVE
Nitrite: NEGATIVE
Protein, ur: NEGATIVE mg/dL
Specific Gravity, Urine: >1.03 — ABNORMAL HIGH (ref 1.005–1.030)
pH: 5.5 (ref 5.0–8.0)

## 2020-11-25 LAB — PROCALCITONIN: Procalcitonin: <0.1 ng/mL

## 2020-11-25 LAB — GLUCOSE, CAPILLARY
Glucose-Capillary: 139 mg/dL — ABNORMAL HIGH (ref 70–99)
Glucose-Capillary: 171 mg/dL — ABNORMAL HIGH (ref 70–99)

## 2020-11-25 LAB — RESP PANEL BY RT-PCR (FLU A&B, COVID) ARPGX2
Influenza A by PCR: NEGATIVE
Influenza B by PCR: NEGATIVE
SARS Coronavirus 2 by RT PCR: POSITIVE — AB

## 2020-11-25 LAB — BRAIN NATRIURETIC PEPTIDE: B Natriuretic Peptide: 112.5 pg/mL — ABNORMAL HIGH (ref 0.0–100.0)

## 2020-11-25 LAB — D-DIMER, QUANTITATIVE: D-Dimer, Quant: <0.27 ug/mL-FEU (ref 0.00–0.50)

## 2020-11-25 LAB — LACTIC ACID, PLASMA: Lactic Acid, Venous: 1.1 mmol/L (ref 0.5–1.9)

## 2020-11-25 MED ORDER — LINAGLIPTIN 5 MG PO TABS
5.0000 mg | ORAL_TABLET | Freq: Every day | ORAL | Status: DC
  Administered 2020-11-25 – 2020-11-27 (×3): 5 mg via ORAL
  Filled 2020-11-25 (×3): qty 1

## 2020-11-25 MED ORDER — SODIUM CHLORIDE 0.9% FLUSH: 3.0000 mL | INTRAVENOUS | Status: DC | PRN

## 2020-11-25 MED ORDER — APIXABAN 5 MG PO TABS
5.0000 mg | ORAL_TABLET | Freq: Two times a day (BID) | ORAL | Status: DC
  Administered 2020-11-25 – 2020-11-27 (×6): 5 mg via ORAL
  Filled 2020-11-25 (×6): qty 1

## 2020-11-25 MED ORDER — MAGNESIUM SULFATE 2 GM/50ML IV SOLN
2.0000 g | Freq: Once | INTRAVENOUS | Status: AC
  Administered 2020-11-25: 2 g via INTRAVENOUS
  Filled 2020-11-25: qty 50

## 2020-11-25 MED ORDER — DOFETILIDE 500 MCG PO CAPS
500.0000 ug | ORAL_CAPSULE | Freq: Two times a day (BID) | ORAL | Status: DC
  Administered 2020-11-25 – 2020-11-27 (×6): 500 ug via ORAL
  Filled 2020-11-25 (×7): qty 1

## 2020-11-25 MED ORDER — ALLOPURINOL 100 MG PO TABS
200.0000 mg | ORAL_TABLET | Freq: Every day | ORAL | Status: DC
  Administered 2020-11-25 – 2020-11-27 (×3): 200 mg via ORAL
  Filled 2020-11-25 (×3): qty 2

## 2020-11-25 MED ORDER — NITROGLYCERIN 0.4 MG SL SUBL: 0.4000 mg | SUBLINGUAL_TABLET | SUBLINGUAL | Status: DC | PRN

## 2020-11-25 MED ORDER — LEVOTHYROXINE SODIUM 112 MCG PO TABS
112.0000 ug | ORAL_TABLET | Freq: Every day | ORAL | Status: DC
  Administered 2020-11-25 – 2020-11-27 (×3): 112 ug via ORAL
  Filled 2020-11-25 (×3): qty 1

## 2020-11-25 MED ORDER — BENAZEPRIL HCL 5 MG PO TABS: 5.0000 mg | ORAL_TABLET | Freq: Every day | ORAL | Status: DC

## 2020-11-25 MED ORDER — GABAPENTIN 100 MG PO CAPS
100.0000 mg | ORAL_CAPSULE | Freq: Every day | ORAL | Status: DC
  Administered 2020-11-25 – 2020-11-26 (×3): 100 mg via ORAL
  Filled 2020-11-25 (×3): qty 1

## 2020-11-25 MED ORDER — METOPROLOL SUCCINATE ER 25 MG PO TB24
25.0000 mg | ORAL_TABLET | Freq: Every day | ORAL | Status: DC
  Administered 2020-11-25 – 2020-11-27 (×3): 25 mg via ORAL
  Filled 2020-11-25 (×3): qty 1

## 2020-11-25 MED ORDER — SODIUM CHLORIDE 0.9 % IV SOLN
100.0000 mg | Freq: Every day | INTRAVENOUS | Status: AC
  Administered 2020-11-26 – 2020-11-27 (×2): 100 mg via INTRAVENOUS
  Filled 2020-11-25 (×2): qty 20

## 2020-11-25 MED ORDER — ASCORBIC ACID 500 MG PO TABS
500.0000 mg | ORAL_TABLET | Freq: Every day | ORAL | Status: DC
  Administered 2020-11-25 – 2020-11-27 (×3): 500 mg via ORAL
  Filled 2020-11-25 (×3): qty 1

## 2020-11-25 MED ORDER — ATORVASTATIN CALCIUM 40 MG PO TABS
40.0000 mg | ORAL_TABLET | Freq: Every day | ORAL | Status: DC
  Administered 2020-11-25 – 2020-11-27 (×3): 40 mg via ORAL
  Filled 2020-11-25 (×3): qty 1

## 2020-11-25 MED ORDER — SODIUM CHLORIDE 0.9 % IV SOLN: 250.0000 mL | INTRAVENOUS | Status: DC | PRN

## 2020-11-25 MED ORDER — INSULIN ASPART 100 UNIT/ML IJ SOLN
0.0000 [IU] | INTRAMUSCULAR | Status: DC
  Administered 2020-11-25 (×4): 4 [IU] via SUBCUTANEOUS

## 2020-11-25 MED ORDER — GUAIFENESIN-DM 100-10 MG/5ML PO SYRP: 10.0000 mL | ORAL_SOLUTION | ORAL | Status: DC | PRN

## 2020-11-25 MED ORDER — DEXAMETHASONE SODIUM PHOSPHATE 10 MG/ML IJ SOLN
6.0000 mg | Freq: Every day | INTRAMUSCULAR | Status: DC
  Administered 2020-11-26 – 2020-11-27 (×2): 6 mg via INTRAVENOUS
  Filled 2020-11-25 (×2): qty 1

## 2020-11-25 MED ORDER — BENAZEPRIL HCL 5 MG PO TABS
15.0000 mg | ORAL_TABLET | Freq: Every day | ORAL | Status: DC
  Administered 2020-11-25 – 2020-11-27 (×3): 15 mg via ORAL
  Filled 2020-11-25 (×4): qty 3

## 2020-11-25 MED ORDER — ACETAMINOPHEN 650 MG RE SUPP: 650.0000 mg | Freq: Four times a day (QID) | RECTAL | Status: DC | PRN

## 2020-11-25 MED ORDER — SODIUM CHLORIDE 0.9% FLUSH
3.0000 mL | Freq: Two times a day (BID) | INTRAVENOUS | Status: DC
  Administered 2020-11-25 – 2020-11-27 (×6): 3 mL via INTRAVENOUS

## 2020-11-25 MED ORDER — TRAMADOL HCL 50 MG PO TABS
100.0000 mg | ORAL_TABLET | Freq: Two times a day (BID) | ORAL | Status: DC
  Administered 2020-11-25 – 2020-11-27 (×5): 100 mg via ORAL
  Filled 2020-11-25 (×5): qty 2

## 2020-11-25 MED ORDER — ONDANSETRON HCL 4 MG PO TABS: 4.0000 mg | ORAL_TABLET | Freq: Four times a day (QID) | ORAL | Status: DC | PRN

## 2020-11-25 MED ORDER — SENNOSIDES-DOCUSATE SODIUM 8.6-50 MG PO TABS: 1.0000 | ORAL_TABLET | Freq: Every day | ORAL | Status: DC | PRN

## 2020-11-25 MED ORDER — ONDANSETRON HCL 4 MG/2ML IJ SOLN: 4.0000 mg | Freq: Four times a day (QID) | INTRAMUSCULAR | Status: DC | PRN

## 2020-11-25 MED ORDER — INSULIN DETEMIR 100 UNIT/ML ~~LOC~~ SOLN
0.1000 [IU]/kg | Freq: Two times a day (BID) | SUBCUTANEOUS | Status: DC
  Administered 2020-11-25: 11 [IU] via SUBCUTANEOUS
  Filled 2020-11-25 (×7): qty 0.11

## 2020-11-25 MED ORDER — TRAMADOL HCL 50 MG PO TABS
50.0000 mg | ORAL_TABLET | Freq: Two times a day (BID) | ORAL | Status: DC | PRN
Start: 2020-11-25 — End: 2020-11-27

## 2020-11-25 MED ORDER — ACETAMINOPHEN 325 MG PO TABS: 650.0000 mg | ORAL_TABLET | Freq: Four times a day (QID) | ORAL | Status: DC | PRN

## 2020-11-25 MED ORDER — DEXAMETHASONE SODIUM PHOSPHATE 10 MG/ML IJ SOLN: 6.0000 mg | Freq: Every day | INTRAMUSCULAR | Status: DC

## 2020-11-25 MED ORDER — SODIUM CHLORIDE 0.9 % IV SOLN
200.0000 mg | Freq: Once | INTRAVENOUS | Status: AC
  Administered 2020-11-25: 200 mg via INTRAVENOUS
  Filled 2020-11-25: qty 40

## 2020-11-25 MED ORDER — ALBUTEROL SULFATE HFA 108 (90 BASE) MCG/ACT IN AERS
2.0000 | INHALATION_SPRAY | Freq: Four times a day (QID) | RESPIRATORY_TRACT | Status: DC
  Administered 2020-11-25 – 2020-11-27 (×9): 2 via RESPIRATORY_TRACT
  Filled 2020-11-25: qty 6.7

## 2020-11-25 MED ORDER — DEXAMETHASONE SODIUM PHOSPHATE 10 MG/ML IJ SOLN
10.0000 mg | Freq: Once | INTRAMUSCULAR | Status: DC
  Administered 2020-11-25: 10 mg via INTRAVENOUS
  Filled 2020-11-25: qty 1

## 2020-11-25 MED ORDER — DILTIAZEM HCL ER COATED BEADS 180 MG PO CP24
180.0000 mg | ORAL_CAPSULE | Freq: Every day | ORAL | Status: DC
  Administered 2020-11-25 – 2020-11-27 (×3): 180 mg via ORAL
  Filled 2020-11-25 (×3): qty 1

## 2020-11-25 MED ORDER — PANTOPRAZOLE SODIUM 40 MG PO TBEC
40.0000 mg | DELAYED_RELEASE_TABLET | Freq: Every day | ORAL | Status: DC
  Administered 2020-11-25 – 2020-11-27 (×3): 40 mg via ORAL
  Filled 2020-11-25 (×3): qty 1

## 2020-11-25 MED ORDER — METFORMIN HCL 500 MG PO TABS
500.0000 mg | ORAL_TABLET | Freq: Two times a day (BID) | ORAL | Status: DC
  Administered 2020-11-25 – 2020-11-27 (×5): 500 mg via ORAL
  Filled 2020-11-25 (×5): qty 1

## 2020-11-25 MED ORDER — ZINC SULFATE 220 (50 ZN) MG PO CAPS
220.0000 mg | ORAL_CAPSULE | Freq: Every day | ORAL | Status: DC
  Administered 2020-11-25 – 2020-11-27 (×3): 220 mg via ORAL
  Filled 2020-11-25 (×3): qty 1

## 2020-11-25 MED ORDER — FUROSEMIDE 40 MG PO TABS: 40.0000 mg | ORAL_TABLET | Freq: Every day | ORAL | Status: DC | PRN

## 2020-11-25 NOTE — ED Provider Notes (Signed)
Care assumed from [facility] patient with a history of atrial fibrillation on Eliquis, hypertension, previous stroke presenting from home with generalized weakness and fever.  Difficulty getting out of bed today with increased cough at home with difficulty getting out of bed and incontinence.   Was hypoxic on room air initially and placed on 2 L nasal cannula oxygen which she does wear occasionally at night.  Lungs are clear.  Chest x-ray is negative.  Febrile on arrival.  COVID is positive Patient does not feel short of breath and is requiring 2 L of oxygen.  Chest x-ray is negative.  Started on remdesivir and steroids.  CT head will be obtained given his generalized weakness and anticoagulation use.  Admission discussed with Dr. Olevia Bowens.    Ezequiel Essex, MD 11/25/20 [phone]

## 2020-11-25 NOTE — ED Notes (Signed)
Pt given AM meds. RN assisted pt to recliner at bedside. Pt was very weak with short shuffling gait. PT consult in, awaiting their assessment.

## 2020-11-25 NOTE — Progress Notes (Signed)
PROGRESS NOTE    [name]  [id number] DOB: 1945/12/02 DOA: 11/24/2020 PCP: Debbrah Alar, NP  Chief Complaint  Patient presents with   Weakness    Brief Narrative:   This is a no charge note as patient was seen and admitted earlier today by Dr. Olevia Bowens chart, imaging and labs were reviewed, patient was seen and examined.     HPI: [name] is a 75 y.o. male with medical history significant of metabolic encephalopathy, respiratory failure hypoxia and hypercapnia, allergic rhinitis,, paroxysmal atrial fibrillation, CAD/CABG, essential hypertension, chronic diastolic heart failure, chronic pain syndrome, GERD, gout, hemorrhoids, hyperlipidemia, hypogonadism male, insomnia, obstructive sleep apnea not available using CPAP, paroxysmal atrial fibrillation, history of other nonhemorrhagic CVA, type 2 diabetes, thoracic aortic atherosclerosis, vasovagal syncope who is coming to the emergency department brought by EMS after his family called due to generalized weakness.  The family stated that the patient took a nap earlier today and then, to call out to his wife because he was too weak to get out of bed.  The patient also stated that he has been coughing with occasional sputum production for the past 2 to 3 days.  He has had 4 doses of an unspecified COVID-vaccine.  He denied travel history or sick contacts.  No headache, rhinorrhea, sore throat, wheezing, hemoptysis, chest pain, palpitations, diaphoresis, PND, orthopnea or recent pitting edema of the lower extremities.  Denied abdominal pain, nausea, emesis, diarrhea, constipation, melena or hematochezia.  No dysuria, frequency or hematuria.  No polyuria, polydipsia, polyphagia or blurred vision.   ED Course: Initial vital signs were temperature 101.5 F, pulse 79, respirations 16, BP 136/69 mmHg O2 sat 98% on nasal cannula oxygen at 1 LPM.  The patient received a 1000 mL LR bolus, dexamethasone 10 mg IVP and acetaminophen 1000 mg  p.o. x1.  Assessment & Plan:   Principal Problem:   2019 novel coronavirus disease (COVID-19) Active Problems:   Hypothyroidism   Hyperlipidemia LDL goal <70   GOUT   Obstructive sleep apnea-Failed CPAP   Essential hypertension   GERD   Type 2 diabetes mellitus without complication, without long-term current use of insulin (HCC)   Chronic diastolic heart failure (HCC)   CAD S/P CFX DES 2011   Paroxysmal atrial fibrillation (HCC)   Class 1 obesity   Normocytic anemia   COVID 2019 novel coronavirus disease (COVID-19) -Patient overall with generalized weakness, extremely frail, he has history of hypertension, diabetes mellitus, CHF, obesity and CAD, high risk of complications from [id number], so he will be kept on IV remdesivir, will decide upon his progression 3 versus 5 days, I will continue with IV steroids for the next 24 hours, and likely will discontinue after that if he remains with no hypoxia. - Continue supplemental oxygen. Flutter valve and I-S as tolerated. Bronchodilators as needed. Antitussives as needed. Remdesivir per pharmacy. Vitamin C and zinc supplementation. Follow-up CBC, CMP and inflammatory markers.   Active Problems:   Paroxysmal atrial fibrillation (HCC) CHA?DS?-VASc Score of 6. Continue Cardizem 180 mg p.o. daily. Continue metoprolol 25 mg p.o. daily. Continue Tikosyn 500 mg p.o. twice daily. Continue apixaban for anticoagulation purposes.     Hypothyroidism Continue levothyroxine 112 mcg p.o. daily.     Hyperlipidemia LDL goal <70 Continue atorvastatin 40 mg p.o. daily.     GOUT Continue allopurinol 200 mg p.o. daily.     Essential hypertension Continue benazepril 50 mg p.o. daily. Continue Cardizem 180 mg p.o. daily. Continue metoprolol 25 mg p.o.  daily. Monitor BP, HR, renal function and electrolytes.     GERD Continue pantoprazole 40 mg p.o. daily.     Type 2 diabetes mellitus without complication,    without long-term current use of  insulin (HCC) Carbohydrate modified diet. Continue metformin 500 mg p.o. twice daily. While on dexamethasone: Begin Levemir 11 units SQ twice daily. Begin Tradjenta 5 mg p.o. daily. CBG monitoring with RI SS every 4 hours.     Chronic diastolic heart failure (HCC) No signs of decompensation. Continue benazepril, furosemide and metoprolol.     CAD S/P CFX DES 2011 Home apixaban. Continue statin and beta-blocker. Sublingual NTG as needed.     Class 1 obesity Lifestyle modifications. To be discussed with outpatient primary.     Normocytic anemia Monitor hematocrit and hemoglobin. Transfuse if needed. Follow-up with PCP.     Obstructive sleep apnea-Failed CPAP Not on CPAP.       DVT prophylaxis: Eliquis Code Status: Full Family Communication: None at bedside Disposition:   Status is: Inpatient  Remains inpatient appropriate because:IV treatments appropriate due to intensity of illness or inability to take PO  Dispo: The patient is from: Home              Anticipated d/c is to: Home              Patient currently is not medically stable to d/c.   Difficult to place patient No       Consultants:  None  Subjective:  Patient reports overall he is feeling better, but he is still feeling weak, unsteady, I have discussed with his RN, patient with with very weak and unsteady gait  Objective: Vitals:   11/25/20 0600 11/25/20 0800 11/25/20 0900 11/25/20 1212  BP: 115/60 (!) 123/59 130/60 116/64  Pulse: (!) 57 (!) 56 66 62  Resp: '18 19 15 17  '$ Temp:  98.5 F (36.9 C)    TempSrc:  Oral    SpO2: 97% 97% 97% 100%  Weight:      Height:        Intake/Output Summary (Last 24 hours) at 11/25/2020 1302 Last data filed at 11/25/2020 [id number] Gross per 24 hour  Intake 1300 ml  Output --  Net 1300 ml   Filed Weights   11/24/20 2226  Weight: 109 kg    Examination:   Awake Alert, Oriented X 3, No new F.N deficits, Normal affect, he is weak, frail Symmetrical Chest  wall movement, Good air movement bilaterally, CTAB RRR,No Gallops,Rubs or new Murmurs, No Parasternal Heave +ve B.Sounds, Abd Soft, No tenderness, No rebound - guarding or rigidity. No Cyanosis, Clubbing or edema, No new Rash or bruise      Data Reviewed: I have personally reviewed following labs and imaging studies  CBC: Recent Labs  Lab 11/24/20 2236  WBC 7.7  NEUTROABS 6.0  HGB 12.9*  HCT 40.7  MCV 96.4  PLT 135*    Basic Metabolic Panel: Recent Labs  Lab 11/24/20 2236 11/25/20 0127  NA 138 138  K 5.4* 4.4  CL 102 102  CO2 29 27  GLUCOSE 113* 116*  BUN 14 14  CREATININE 1.19 1.13  CALCIUM 9.2 8.9  MG  --  1.9    GFR: Estimated Creatinine Clearance: 73 mL/min (by C-G formula based on SCr of 1.13 mg/dL).  Liver Function Tests: Recent Labs  Lab 11/24/20 2236  AST 48*  ALT 30  ALKPHOS 47  BILITOT 0.9  PROT 6.7  ALBUMIN 3.7  CBG: Recent Labs  Lab 11/25/20 0529 11/25/20 0751 11/25/20 1241  GLUCAP 160* 181* 160*     Recent Results (from the past 240 hour(s))  Resp Panel by RT-PCR (Flu A&B, Covid) Nasopharyngeal Swab     Status: Abnormal   Collection Time: 11/24/20 10:37 PM   Specimen: Nasopharyngeal Swab; Nasopharyngeal(NP) swabs in vial transport medium  Result Value Ref Range Status   SARS Coronavirus 2 by RT PCR POSITIVE (A) NEGATIVE Final    Comment: RESULT CALLED TO, READ BACK BY AND VERIFIED WITH: RN SOPHIW [id number] 11/25/20'@00'$ :14 BY TW (NOTE) SARS-CoV-2 target nucleic acids are DETECTED.  The SARS-CoV-2 RNA is generally detectable in upper respiratory specimens during the acute phase of infection. Positive results are indicative of the presence of the identified virus, but do not rule out bacterial infection or co-infection with other pathogens not detected by the test. Clinical correlation with patient history and other diagnostic information is necessary to determine patient infection status. The expected result is Negative.  Fact  Sheet for Patients: EntrepreneurPulse.com.au  Fact Sheet for Healthcare Providers: IncredibleEmployment.be  This test is not yet approved or cleared by the [place] FDA and  has been authorized for detection and/or diagnosis of SARS-CoV-2 by FDA under an Emergency Use Authorization (EUA).  This EUA will remain in effect (meaning this test can  be used) for the duration of  the COVID-19 declaration under Section 564(b)(1) of the Act, 21 U.S.C. section 360bbb-3(b)(1), unless the authorization is terminated or revoked sooner.     Influenza A by PCR NEGATIVE NEGATIVE Final   Influenza B by PCR NEGATIVE NEGATIVE Final    Comment: (NOTE) The Xpert Xpress SARS-CoV-2/FLU/RSV plus assay is intended as an aid in the diagnosis of influenza from Nasopharyngeal swab specimens and should not be used as a sole basis for treatment. Nasal washings and aspirates are unacceptable for Xpert Xpress SARS-CoV-2/FLU/RSV testing.  Fact Sheet for Patients: EntrepreneurPulse.com.au  Fact Sheet for Healthcare Providers: IncredibleEmployment.be  This test is not yet approved or cleared by the [place] FDA and has been authorized for detection and/or diagnosis of SARS-CoV-2 by FDA under an Emergency Use Authorization (EUA). This EUA will remain in effect (meaning this test can be used) for the duration of the COVID-19 declaration under Section 564(b)(1) of the Act, 21 U.S.C. section 360bbb-3(b)(1), unless the authorization is terminated or revoked.  Performed at [facility], [place] [address], [place], [place] [address]          Radiology Studies: CT HEAD WO CONTRAST (5MM)  Result Date: 11/25/2020 CLINICAL DATA:  Delirium. EXAM: CT HEAD WITHOUT CONTRAST TECHNIQUE: Contiguous axial images were obtained from the base of the skull through the vertex without intravenous contrast. COMPARISON:  Head CT dated 12/14/2018.  FINDINGS: Brain: Moderate age-related atrophy and chronic microvascular ischemic changes. There is no acute hemorrhage. No mass effect or midline shift. No extra-axial fluid collection. Vascular: No hyperdense vessel or unexpected calcification. Skull: Normal. Negative for fracture or focal lesion. Sinuses/Orbits: Diffuse mucoperiosteal thickening of paranasal sinuses. No air-fluid level. The mastoid air cells are clear. Other: None IMPRESSION: 1. No acute intracranial pathology. 2. Moderate age-related atrophy and chronic microvascular ischemic changes. Electronically Signed   By: Anner Crete M.D.   On: 11/25/2020 02:44   DG Chest Portable 1 View  Result Date: 11/24/2020 CLINICAL DATA:  Weakness, hypoxia EXAM: PORTABLE CHEST 1 VIEW COMPARISON:  12/14/2018 FINDINGS: Lungs are clear.  No pleural effusion or pneumothorax. Eventration of the right hemidiaphragm.  The heart is top-normal in size. Postsurgical changes related to prior CABG. Median sternotomy. IMPRESSION: No evidence of acute cardiopulmonary disease. Electronically Signed   By: Julian Hy M.D.   On: 11/24/2020 23:04        Scheduled Meds:  albuterol  2 puff Inhalation Q6H   allopurinol  200 mg Oral Daily   apixaban  5 mg Oral BID   vitamin C  500 mg Oral Daily   atorvastatin  40 mg Oral Daily   benazepril  15 mg Oral Daily   [START ON 11/26/2020] dexamethasone (DECADRON) injection  6 mg Intravenous Daily   diltiazem  180 mg Oral Daily   dofetilide  500 mcg Oral BID   gabapentin  100 mg Oral QHS   insulin aspart  0-20 Units Subcutaneous Q4H   insulin detemir  0.1 Units/kg Subcutaneous BID   levothyroxine  112 mcg Oral QAC breakfast   linagliptin  5 mg Oral Daily   metFORMIN  500 mg Oral BID WC   metoprolol succinate  25 mg Oral Daily   pantoprazole  40 mg Oral QAC breakfast   sodium chloride flush  3 mL Intravenous Q12H   traMADol  100 mg Oral BID   zinc sulfate  220 mg Oral Daily   Continuous Infusions:  sodium  chloride     [START ON 11/26/2020] remdesivir 100 mg in NS 100 mL       LOS: 0 days     Phillips Climes, MD [facility]   To contact the attending provider between 7A-7P or the covering provider during after hours 7P-7A, please log into the web site [email or web address] and access using universal [place] password for that web site. If you do not have the password, please call the hospital operator.  11/25/2020, 1:02 PM

## 2020-11-25 NOTE — ED Notes (Signed)
Lunch given, pt denies needs

## 2020-11-25 NOTE — H&P (Signed)
History and Physical    [name] VOP:[id number] DOB: 03-20-1945 DOA: 11/24/2020  PCP: Debbrah Alar, NP   Patient coming from: Home.  I have personally briefly reviewed patient's old medical records in [place]  Chief Complaint: Generalized weakness.  HPI: [name] is a 75 y.o. male with medical history significant of metabolic encephalopathy, respiratory failure hypoxia and hypercapnia, allergic rhinitis,, paroxysmal atrial fibrillation, CAD/CABG, essential hypertension, chronic diastolic heart failure, chronic pain syndrome, GERD, gout, hemorrhoids, hyperlipidemia, hypogonadism male, insomnia, obstructive sleep apnea not available using CPAP, paroxysmal atrial fibrillation, history of other nonhemorrhagic CVA, type 2 diabetes, thoracic aortic atherosclerosis, vasovagal syncope who is coming to the emergency department brought by EMS after his family called due to generalized weakness.  The family stated that the patient took a nap earlier today and then, to call out to his wife because he was too weak to get out of bed.  The patient also stated that he has been coughing with occasional sputum production for the past 2 to 3 days.  He has had 4 doses of an unspecified COVID-vaccine.  He denied travel history or sick contacts.  No headache, rhinorrhea, sore throat, wheezing, hemoptysis, chest pain, palpitations, diaphoresis, PND, orthopnea or recent pitting edema of the lower extremities.  Denied abdominal pain, nausea, emesis, diarrhea, constipation, melena or hematochezia.  No dysuria, frequency or hematuria.  No polyuria, polydipsia, polyphagia or blurred vision.  ED Course: Initial vital signs were temperature 101.5 F, pulse 79, respirations 16, BP 136/69 mmHg O2 sat 98% on nasal cannula oxygen at 1 LPM.  The patient received a 1000 mL LR bolus, dexamethasone 10 mg IVP and acetaminophen 1000 mg p.o. x1.  Lab work: CBC showed a white count 7.7, hemoglobin 12.9 g/dL  platelets 135.  PT 16.3, INR 1.3 PTT 31.  Lactic acid x2 was normal.  CMP showed a potassium of 5.4 mmol/L, glucose of 113 mg/dL and AST slightly elevated at 48 units/L.  The rest of the CMP values were normal.  Coronavirus 2 PCR was positive.  Imaging: A portable 1 view chest radiograph did not show any evidence of acute cardiopulmonary disease.  Review of Systems: As per HPI otherwise all other systems reviewed and are negative.  Past Medical History:  Diagnosis Date   Acute encephalopathy 12/12/2015   Acute respiratory failure with hypoxia and hypercapnia (HCC) 12/12/2015   Allergic rhinitis 02/13/2010   Qualifier: Diagnosis of  By: Wynona Luna    Angina at rest [facility]) 10/22/2015   Ataxia 08/10/2013   Atrial fibrillation ([place]) 11/2018   B12 deficiency 08/11/2013   BACK PAIN, LUMBAR 01/29/2009   Qualifier: Diagnosis of  By: Wynona Luna    BENIGN POSITIONAL VERTIGO 11/23/2009   Qualifier: Diagnosis of  By: Wynona Luna    Chronic diastolic heart failure ([place]) 04/24/2015   Chronic pain    left sided-[place]   Coronary 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 ([place]) 11/04/2015   s/p CABG 2004; CFX DES 2011   Essential hypertension 12/12/2006   Qualifier: Diagnosis of  By: [name] RMA, [place]     GERD 12/12/2006   Qualifier: Diagnosis of  By: Reatha Armour, [place]     GOUT 12/12/2006   Qualifier: Diagnosis of  By: [name] RMA, Lucy     Hemorrhoids    Hyperlipidemia LDL goal <70 12/12/2006   Qualifier: Diagnosis of  By: Reatha Armour, [place]      Hypogonadism male 04/10/2011  Hypothyroidism, acquired 02/15/2013   Insomnia 05/21/2013   Long term current use of anticoagulant therapy 06/24/2011   Obstructive sleep apnea-Failed CPAP 06/17/2007   Failed CPAP Using O 2 for sleep    Paroxysmal atrial fibrillation ([place]) 12/06/2016   Stroke (cerebrum) ([place]) 01/13/2017   also 1996; resultant hemiplegia of dominant side   Thoracic aorta atherosclerosis ([place]) 04/27/2017   Tubular adenoma of  colon 05/2010   Type 2 diabetes mellitus without complication, without long-term current use of insulin ([place]) 10/05/2007   Qualifier: Diagnosis of  By: Wynona Luna    Vasovagal syncope    Wears glasses    Past Surgical History:  Procedure Laterality Date   CARDIAC CATHETERIZATION N/A 10/25/2015   Procedure: Left Heart Cath and Cors/Grafts Angiography;  Surgeon: Troy Sine, MD;  Location: [place] CV LAB;  Service: Cardiovascular;  Laterality: N/A;   CARDIOVERSION  06/20/2011   Procedure: CARDIOVERSION;  Surgeon: Larey Dresser, MD;  Location: [place];  Service: Cardiovascular;  Laterality: N/A;   CARDIOVERSION N/A 04/26/2015   Procedure: CARDIOVERSION;  Surgeon: Dorothy Spark, MD;  Location: [facility] ENDOSCOPY;  Service: Cardiovascular;  Laterality: N/A;   CARDIOVERSION N/A 05/10/2015   Procedure: CARDIOVERSION;  Surgeon: Larey Dresser, MD;  Location: [place];  Service: Cardiovascular;  Laterality: N/A;   CARDIOVERSION N/A 12/10/2018   Procedure: CARDIOVERSION;  Surgeon: Sanda Klein, MD;  Location: [place];  Service: Cardiovascular;  Laterality: N/A;   CATARACT EXTRACTION  05/2010   left eye   CATARACT EXTRACTION  04/2010   right eye   COLONOSCOPY WITH PROPOFOL N/A 02/22/2020   Procedure: COLONOSCOPY WITH PROPOFOL;  Surgeon: Lavena Bullion, DO;  Location: [facility] ENDOSCOPY;  Service: Gastroenterology;  Laterality: N/A;   CORONARY ARTERY BYPASS GRAFT     stent   ESOPHAGOGASTRODUODENOSCOPY  03-25-2005   LEFT HEART CATH AND CORS/GRAFTS ANGIOGRAPHY N/A 02/19/2018   Procedure: LEFT HEART CATH AND CORS/GRAFTS ANGIOGRAPHY;  Surgeon: Martinique, Peter M, MD;  Location: [place] CV LAB;  Service: Cardiovascular;  Laterality: N/A;   NASAL SEPTUM SURGERY     PERCUTANEOUS PLACEMENT INTRAVASCULAR STENT CERVICAL CAROTID ARTERY     03-2009; using a drug-eluting platform of the circumflex cornoray artery with a 3.0 x 18 [employer] Promus drug-eluting platform post dilated to  3.75 with a noncompliant balloon.   POLYPECTOMY  02/22/2020   Procedure: POLYPECTOMY;  Surgeon: Lavena Bullion, DO;  Location: [facility] ENDOSCOPY;  Service: Gastroenterology;;   TEE WITHOUT CARDIOVERSION  06/20/2011   Procedure: TRANSESOPHAGEAL ECHOCARDIOGRAM (TEE);  Surgeon: Larey Dresser, MD;  Location: [place];  Service: Cardiovascular;  Laterality: N/A;   Social History  reports that he has never smoked. He has never used smokeless tobacco. He reports that he does not currently use alcohol. He reports that he does not use drugs.  No Known Allergies  Family History  Problem Relation Age of Onset   Lung cancer Father        deceased   Stroke Mother        deceased-MINISTROKES   Colon cancer Neg Hx    Esophageal cancer Neg Hx    Stomach cancer Neg Hx    Pancreatic cancer Neg Hx    Liver disease Neg Hx    Prior to Admission medications   Medication Sig Start Date End Date Taking? Authorizing Provider  Accu-Chek FastClix Lancets MISC USE TO TEST 3 TIMES DAILY 06/07/19   Isaiah Serge, NP  allopurinol (ZYLOPRIM) 100 MG tablet  TAKE 2 TABLETS BY MOUTH EVERY DAY 11/05/20   Debbrah Alar, NP  atorvastatin (LIPITOR) 40 MG tablet TAKE 1 TABLET BY MOUTH EVERY DAY 09/30/20   Debbrah Alar, NP  benazepril (LOTENSIN) 10 MG tablet TAKE 1 TABLET (10 MG TOTAL) BY MOUTH DAILY. TAKE WITH ADDITIONAL 5MG TABLET OF BENAZEPRIL DAILY 09/30/20   Debbrah Alar, NP  benazepril (LOTENSIN) 5 MG tablet TAKE 1 TABLET BY MOUTH DAILY WITH 10MG OF BENAZEPRIL TO EQUAL 15MG DAILY 09/30/20   Debbrah Alar, NP  blood glucose meter kit and supplies KIT Dispense based on patient and insurance preference. Use up to four times daily as directed. (FOR ICD-9 250.00, 250.01). 01/26/19   Isaiah Serge, NP  cyanocobalamin 1000 MCG tablet Take 2,000 mcg by mouth daily. Take 2 Gummies daily    [provider]  diclofenac Sodium (VOLTAREN) 1 % GEL USE AS DIRECTED Patient taking differently: Apply  1 application topically 4 (four) times daily as needed (pain.). 08/29/19   Leandrew Koyanagi, MD  diltiazem (CARDIZEM CD) 180 MG 24 hr capsule TAKE 1 CAPSULE BY MOUTH EVERY DAY Patient taking differently: Take 180 mg by mouth daily. 02/03/20   Deboraha Sprang, MD  dofetilide (TIKOSYN) 500 MCG capsule TAKE 1 CAPSULE BY MOUTH TWICE A DAY 06/27/20   Baldwin Jamaica, PA-C  doxycycline (VIBRAMYCIN) 100 MG capsule Take 1 capsule (100 mg total) by mouth 2 (two) times daily. 08/07/20   Pearson Forster, NP  ELIQUIS 5 MG TABS tablet TAKE 1 TABLET BY MOUTH TWICE A DAY 06/18/20   Jerline Pain, MD  ferrous sulfate 325 (65 FE) MG tablet Take 1 tablet (325 mg total) by mouth 2 (two) times daily with a meal. Patient taking differently: Take 650 mg by mouth every evening. 05/25/19   Debbrah Alar, NP  fluticasone (FLONASE) 50 MCG/ACT nasal spray Place 2 sprays into both nostrils as needed for allergies or rhinitis. 04/14/19   Isaiah Serge, NP  folic acid (FOLVITE) 1 MG tablet TAKE 1 TABLET BY MOUTH EVERY DAY 09/24/20   Debbrah Alar, NP  furosemide (LASIX) 40 MG tablet TAKE 1 TABLET (40 MG TOTAL) BY MOUTH DAILY AS NEEDED FOR FLUID OR EDEMA. 09/24/20   Debbrah Alar, NP  gabapentin (NEURONTIN) 100 MG capsule TAKE 1 CAPSULE BY MOUTH AT BEDTIME. 09/16/20   Debbrah Alar, NP  gabapentin (NEURONTIN) 300 MG capsule Take 1 capsule (300 mg total) by mouth 2 (two) times daily. 07/19/20   Kirsteins, Luanna Salk, MD  glucose blood (ACCU-CHEK GUIDE) test strip USE AS DIRECTED UP TO 4 TIMES DAILY 03/21/19   Debbrah Alar, NP  levothyroxine (SYNTHROID) 112 MCG tablet TAKE 1 TABLET BY MOUTH DAILY BEFORE BREAKFAST. 10/08/20   Debbrah Alar, NP  metFORMIN (GLUCOPHAGE) 500 MG tablet TAKE 1 TABLET BY MOUTH EVERY DAY WITH BREAKFAST 10/08/20   Debbrah Alar, NP  metoprolol succinate (TOPROL-XL) 25 MG 24 hr tablet Take 1 tablet (25 mg total) by mouth daily. Please make overdue appt with Dr. Caryl Comes before anymore  refills. Thank you 1st attempt 11/14/20   Deboraha Sprang, MD  Multiple Vitamins-Minerals (MULTIVITAMIN GUMMIES MENS PO) Take 2 tablets by mouth daily with breakfast.     [provider]  nitroGLYCERIN (NITROSTAT) 0.4 MG SL tablet Place 1 tablet (0.4 mg total) under the tongue every 5 (five) minutes as needed for chest pain. Up to 3 doses 03/05/15   Debbrah Alar, NP  OXYGEN Inhale 2 L into the lungs at bedtime.  [provider]  pantoprazole (PROTONIX) 40 MG tablet Take 1 tablet (40 mg total) by mouth daily before breakfast. 05/23/20   Debbrah Alar, NP  sennosides-docusate sodium (SENOKOT-S) 8.6-50 MG tablet Take 1 tablet by mouth as needed for constipation.    [provider]  traMADol (ULTRAM) 50 MG tablet TAKE 1 TABLET BY MOUTH TWICE A DAY 09/13/20   Kirsteins, Luanna Salk, MD  traMADol (ULTRAM-ER) 200 MG 24 hr tablet Take 1 tablet (200 mg total) by mouth daily. 09/04/20   Charlett Blake, MD   Physical Exam: Vitals:   11/24/20 2226 11/24/20 2330 11/25/20 0000 11/25/20 0034  BP:  (!) 145/76 133/66   Pulse:  71 65   Resp:  (!) 25 (!) 24   Temp:    99.2 F (37.3 C)  TempSrc:    Oral  SpO2:  100% 98%   Weight: 109 kg     Height: _0  (1.905 m)      Constitutional: Looks acutely ill.  NAD, calm, comfortable Eyes: PERRL, lids and conjunctivae normal.  Sclerae is injected. ENMT: Mucous membranes are mildly dry.  Posterior pharynx clear of any exudate or lesions. Neck: normal, supple, no masses, no thyromegaly Respiratory: At times mildly tachypneic in the low 20s, but clear to auscultation bilaterally, no wheezing, no crackles.  No accessory muscle use.  Cardiovascular: Regular rate and rhythm, no murmurs / rubs / gallops. No extremity edema. 2+ pedal pulses. No carotid bruits.  Abdomen: Obese, no distention.  Bowel sounds positive.  Soft, no tenderness, no masses palpated. No hepatosplenomegaly.  Musculoskeletal: Moderate generalized weakness.  No  clubbing / cyanosis. Good ROM, no contractures. Normal muscle tone.  Skin: Positive onychomycosis of multiple toenails. Neurologic: CN 2-12 grossly intact. Sensation intact, DTR normal. Strength 5/5 in all 4.  Psychiatric: Normal judgment and insight. Alert and oriented x 3, oriented to scintillation and partially oriented to date. Normal mood.   Labs on Admission: I have personally reviewed following labs and imaging studies  CBC: Recent Labs  Lab 11/24/20 2236  WBC 7.7  NEUTROABS 6.0  HGB 12.9*  HCT 40.7  MCV 96.4  PLT 135*    Basic Metabolic Panel: Recent Labs  Lab 11/24/20 2236  NA 138  K 5.4*  CL 102  CO2 29  GLUCOSE 113*  BUN 14  CREATININE 1.19  CALCIUM 9.2    GFR: Estimated Creatinine Clearance: 69.3 mL/min (by C-G formula based on SCr of 1.19 mg/dL).  Liver Function Tests: Recent Labs  Lab 11/24/20 2236  AST 48*  ALT 30  ALKPHOS 47  BILITOT 0.9  PROT 6.7  ALBUMIN 3.7   Radiological Exams on Admission: DG Chest Portable 1 View  Result Date: 11/24/2020 CLINICAL DATA:  Weakness, hypoxia EXAM: PORTABLE CHEST 1 VIEW COMPARISON:  12/14/2018 FINDINGS: Lungs are clear.  No pleural effusion or pneumothorax. Eventration of the right hemidiaphragm. The heart is top-normal in size. Postsurgical changes related to prior CABG. Median sternotomy. IMPRESSION: No evidence of acute cardiopulmonary disease. Electronically Signed   By: Julian Hy M.D.   On: 11/24/2020 23:04    EKG: Independently reviewed.  Vent. rate 74 BPM PR interval 201 ms QRS duration 126 ms QT/QTcB 398/442 ms P-R-T axes 68 -57 91 Sinus rhythm Nonspecific IVCD with LAD  Assessment/Plan Principal Problem:   2019 novel coronavirus disease (COVID-19) Observation/telemetry.  Continue supplemental oxygen. Flutter valve and I-S as tolerated. Bronchodilators as needed. Antitussives as needed. Remdesivir per pharmacy. Dexamethasone 6 mg IVP every 24  hours. Vitamin C and zinc  supplementation. Follow-up CBC, CMP and inflammatory markers.  Active Problems:   Paroxysmal atrial fibrillation (HCC) CHA?DS?-VASc Score of 6. Continue Cardizem 180 mg p.o. daily. Continue metoprolol 25 mg p.o. daily. Continue Tikosyn 500 mg p.o. twice daily. Continue apixaban for anticoagulation purposes.    Hypothyroidism Continue levothyroxine 112 mcg p.o. daily.    Hyperlipidemia LDL goal <70 Continue atorvastatin 40 mg p.o. daily.    GOUT Continue allopurinol 200 mg p.o. daily.    Essential hypertension Continue benazepril 50 mg p.o. daily. Continue Cardizem 180 mg p.o. daily. Continue metoprolol 25 mg p.o. daily. Monitor BP, HR, renal function and electrolytes.    GERD Continue pantoprazole 40 mg p.o. daily.    Type 2 diabetes mellitus without complication,    without long-term current use of insulin (HCC) Carbohydrate modified diet. Continue metformin 500 mg p.o. twice daily. While on dexamethasone: Begin Levemir 11 units SQ twice daily. Begin Tradjenta 5 mg p.o. daily. CBG monitoring with RI SS every 4 hours.    Chronic diastolic heart failure (HCC) No signs of decompensation. Continue benazepril, furosemide and metoprolol.    CAD S/P CFX DES 2011 Home apixaban. Continue statin and beta-blocker. Sublingual NTG as needed.    Class 1 obesity Lifestyle modifications. To be discussed with outpatient primary.    Normocytic anemia Monitor hematocrit and hemoglobin. Transfuse if needed. Follow-up with PCP.    Obstructive sleep apnea-Failed CPAP Not on CPAP.    DVT prophylaxis: On apixaban. Code Status:   Full code. Family Communication:   Disposition Plan:   Patient is from:  Home.  Anticipated DC to:  TBD.  Anticipated DC date:  11/27/2020.  Anticipated DC barriers: Clinical status. Consults called:   Admission status:  Observation/telemetry.  Severity of Illness:  High severity after presenting pain with generalized weakness and hypoxia with  O2 sat in the 80s in the setting of acute COVID-19 disease.  The patient will need to remain in the hospital for treatment for at least 48 hours.  Reubin Milan MD [facility]  How to contact the [facility] Attending or Consulting provider [place] or covering provider during after hours [place], for this patient?   Check the care team in [facility] and look for a) attending/consulting [facility] provider listed and b) the [facility] team listed Log into [email or web address] and use [place]'s universal password to access. If you do not have the password, please contact the hospital operator. Locate the [facility] provider you are looking for under [facility] and page to a number that you can be directly reached. If you still have difficulty reaching the provider, please page the [facility] (Director on Call) for the Hospitalists listed on amion for assistance.  11/25/2020, 12:58 AM   This document was prepared using Dragon voice recognition software and may contain some unintended transcription errors.

## 2020-11-25 NOTE — ED Notes (Signed)
Breakfast Ordered 

## 2020-11-26 ENCOUNTER — Other Ambulatory Visit: Payer: Self-pay | Admitting: *Deleted

## 2020-11-26 DIAGNOSIS — I10 Essential (primary) hypertension: Secondary | ICD-10-CM

## 2020-11-26 LAB — COMPREHENSIVE METABOLIC PANEL
ALT: 28 U/L (ref 0–44)
AST: 39 U/L (ref 15–41)
Albumin: 3.1 g/dL — ABNORMAL LOW (ref 3.5–5.0)
Alkaline Phosphatase: 42 U/L (ref 38–126)
Anion gap: 9 (ref 5–15)
BUN: 32 mg/dL — ABNORMAL HIGH (ref 8–23)
CO2: 24 mmol/L (ref 22–32)
Calcium: 9.1 mg/dL (ref 8.9–10.3)
Chloride: 103 mmol/L (ref 98–111)
Creatinine, Ser: 1.28 mg/dL — ABNORMAL HIGH (ref 0.61–1.24)
GFR, Estimated: 58 mL/min — ABNORMAL LOW (ref >60)
Glucose, Bld: 121 mg/dL — ABNORMAL HIGH (ref 70–99)
Potassium: 4.2 mmol/L (ref 3.5–5.1)
Sodium: 136 mmol/L (ref 135–145)
Total Bilirubin: 0.7 mg/dL (ref 0.3–1.2)
Total Protein: 5.8 g/dL — ABNORMAL LOW (ref 6.5–8.1)

## 2020-11-26 LAB — CBC WITH DIFFERENTIAL/PLATELET
Abs Immature Granulocytes: 0.03 10*3/uL (ref 0.00–0.07)
Basophils Absolute: 0 10*3/uL (ref 0.0–0.1)
Basophils Relative: 0 %
Eosinophils Absolute: 0 10*3/uL (ref 0.0–0.5)
Eosinophils Relative: 0 %
HCT: 35.1 % — ABNORMAL LOW (ref 39.0–52.0)
Hemoglobin: 11.5 g/dL — ABNORMAL LOW (ref 13.0–17.0)
Immature Granulocytes: 0 %
Lymphocytes Relative: 10 %
Lymphs Abs: 1 10*3/uL (ref 0.7–4.0)
MCH: 30.9 pg (ref 26.0–34.0)
MCHC: 32.8 g/dL (ref 30.0–36.0)
MCV: 94.4 fL (ref 80.0–100.0)
Monocytes Absolute: 0.8 10*3/uL (ref 0.1–1.0)
Monocytes Relative: 8 %
Neutro Abs: 8 10*3/uL — ABNORMAL HIGH (ref 1.7–7.7)
Neutrophils Relative %: 82 %
Platelets: 125 10*3/uL — ABNORMAL LOW (ref 150–400)
RBC: 3.72 MIL/uL — ABNORMAL LOW (ref 4.22–5.81)
RDW: 13.2 % (ref 11.5–15.5)
WBC: 9.8 10*3/uL (ref 4.0–10.5)
nRBC: 0 % (ref 0.0–0.2)

## 2020-11-26 LAB — C-REACTIVE PROTEIN: CRP: 2.9 mg/dL — ABNORMAL HIGH (ref <1.0)

## 2020-11-26 LAB — GLUCOSE, CAPILLARY
Glucose-Capillary: 119 mg/dL — ABNORMAL HIGH (ref 70–99)
Glucose-Capillary: 119 mg/dL — ABNORMAL HIGH (ref 70–99)
Glucose-Capillary: 122 mg/dL — ABNORMAL HIGH (ref 70–99)
Glucose-Capillary: 137 mg/dL — ABNORMAL HIGH (ref 70–99)
Glucose-Capillary: 137 mg/dL — ABNORMAL HIGH (ref 70–99)
Glucose-Capillary: 144 mg/dL — ABNORMAL HIGH (ref 70–99)

## 2020-11-26 LAB — D-DIMER, QUANTITATIVE: D-Dimer, Quant: <0.27 ug/mL-FEU (ref 0.00–0.50)

## 2020-11-26 LAB — PHOSPHORUS: Phosphorus: 3.4 mg/dL (ref 2.5–4.6)

## 2020-11-26 LAB — FERRITIN: Ferritin: 224 ng/mL (ref 24–336)

## 2020-11-26 LAB — MAGNESIUM: Magnesium: 2.2 mg/dL (ref 1.7–2.4)

## 2020-11-26 MED ORDER — BACITRACIN ZINC 500 UNIT/GM EX OINT
TOPICAL_OINTMENT | Freq: Two times a day (BID) | CUTANEOUS | Status: DC
  Filled 2020-11-26 (×2): qty 28.4

## 2020-11-26 MED ORDER — GUAIFENESIN ER 600 MG PO TB12
600.0000 mg | ORAL_TABLET | Freq: Two times a day (BID) | ORAL | Status: DC
  Administered 2020-11-26 – 2020-11-27 (×3): 600 mg via ORAL
  Filled 2020-11-26 (×3): qty 1

## 2020-11-26 NOTE — Progress Notes (Signed)
PROGRESS NOTE    [name]  [id number] DOB: 04/27/1945 DOA: 11/24/2020 PCP: Debbrah Alar, NP  Chief Complaint  Patient presents with   Weakness    Brief Narrative:      [name] is a 75 y.o. male with medical history significant of metabolic encephalopathy, respiratory failure hypoxia and hypercapnia, allergic rhinitis,, paroxysmal atrial fibrillation, CAD/CABG, essential hypertension, chronic diastolic heart failure, chronic pain syndrome, GERD, gout, hemorrhoids, hyperlipidemia, hypogonadism male, insomnia, obstructive sleep apnea not available using CPAP, paroxysmal atrial fibrillation, history of other nonhemorrhagic CVA, type 2 diabetes, thoracic aortic atherosclerosis, vasovagal syncope who is coming to the emergency department brought by EMS after his family called due to generalized weakness.  Patient was noted to be febrile 101.5, he is diagnosed with COVID-19, admitted for further work-up.    Assessment & Plan:   Principal Problem:   2019 novel coronavirus disease (COVID-19) Active Problems:   Hypothyroidism   Hyperlipidemia LDL goal <70   GOUT   Obstructive sleep apnea-Failed CPAP   Essential hypertension   GERD   Type 2 diabetes mellitus without complication, without long-term current use of insulin (HCC)   Chronic diastolic heart failure (HCC)   CAD S/P CFX DES 2011   Paroxysmal atrial fibrillation (HCC)   Class 1 obesity   Normocytic anemia   COVID 2019 novel coronavirus disease (COVID-19) -Patient overall with generalized weakness, extremely frail, he has history of hypertension, diabetes mellitus, CHF, obesity and CAD, high risk of complications from [id number], so he will be kept on IV remdesivir, will treat total of 3 days of IV remdesivir.  . -I will continue with IV steroids daily for another 24 hours - Continue supplemental oxygen as needed. Report congestion, phlegm, have encouraged him to use incentive spirometry and flutter  valve, I will add Mucinex for him. Bronchodilators as needed. Antitussives as needed. Remdesivir per pharmacy. Vitamin C and zinc supplementation. Follow-up CBC, CMP and inflammatory markers.      Paroxysmal atrial fibrillation (HCC) CHA?DS?-VASc Score of 6. Continue Cardizem 180 mg p.o. daily. Continue metoprolol 25 mg p.o. daily. Continue Tikosyn 500 mg p.o. twice daily. Continue apixaban for anticoagulation purposes.     Hypothyroidism Continue levothyroxine 112 mcg p.o. daily.     Hyperlipidemia LDL goal <70 Continue atorvastatin 40 mg p.o. daily.     GOUT Continue allopurinol 200 mg p.o. daily.     Essential hypertension Continue benazepril 50 mg p.o. daily. Continue Cardizem 180 mg p.o. daily. Continue metoprolol 25 mg p.o. daily. Monitor BP, HR, renal function and electrolytes.     GERD Continue pantoprazole 40 mg p.o. daily.     Type 2 diabetes mellitus without complication,    without long-term current use of insulin (HCC) Carbohydrate modified diet. Continue metformin 500 mg p.o. twice daily. While on dexamethasone: Begin Levemir 11 units SQ twice daily. Begin Tradjenta 5 mg p.o. daily. CBG monitoring with RI SS every 4 hours.     Chronic diastolic heart failure (HCC) No signs of decompensation. Continue benazepril, furosemide and metoprolol.     CAD S/P CFX DES 2011 Home apixaban. Continue statin and beta-blocker. Sublingual NTG as needed.     Class 1 obesity Lifestyle modifications. To be discussed with outpatient primary.     Normocytic anemia Monitor hematocrit and hemoglobin. Transfuse if needed. Follow-up with PCP.     Obstructive sleep apnea-Failed CPAP Not on CPAP.       DVT prophylaxis: Eliquis Code Status: Full Family Communication: None at bedside Disposition:  Status is: Inpatient  Remains inpatient appropriate because:IV treatments appropriate due to intensity of illness or inability to take PO  Dispo: The patient is  from: Home              Anticipated d/c is to: Home              Patient currently is not medically stable to d/c.   Difficult to place patient No       Consultants:  None  Subjective:  Report dyspnea has improved, he still reports cough, congestion and productive phlegm.  Objective: Vitals:   11/26/20 0030 11/26/20 0400 11/26/20 0841 11/26/20 1232  BP:  118/64 121/63 131/67  Pulse:  (!) 54 (!) 55 (!) 59  Resp:  '17 13 18  '$ Temp:  98.4 F (36.9 C) 97.6 F (36.4 C) 97.7 F (36.5 C)  TempSrc:  Oral Oral Oral  SpO2: 97% 96% 96% 96%  Weight:      Height:        Intake/Output Summary (Last 24 hours) at 11/26/2020 1251 Last data filed at 11/25/2020 1910 Gross per 24 hour  Intake 240 ml  Output --  Net 240 ml   Filed Weights   11/24/20 2226 11/25/20 1635  Weight: 109 kg 109 kg    Examination:   Awake Alert, Oriented X 3, No new F.N deficits, Normal affect Symmetrical Chest wall movement, Good air movement bilaterally, CTAB RRR,No Gallops,Rubs or new Murmurs, No Parasternal Heave +ve B.Sounds, Abd Soft, No tenderness, No rebound - guarding or rigidity. No Cyanosis, Clubbing or edema, No new Rash or bruise       Data Reviewed: I have personally reviewed following labs and imaging studies  CBC: Recent Labs  Lab 11/24/20 2236 11/26/20 0126  WBC 7.7 9.8  NEUTROABS 6.0 8.0*  HGB 12.9* 11.5*  HCT 40.7 35.1*  MCV 96.4 94.4  PLT 135* 125*    Basic Metabolic Panel: Recent Labs  Lab 11/24/20 2236 11/25/20 0127 11/26/20 0126  NA 138 138 136  K 5.4* 4.4 4.2  CL 102 102 103  CO2 '29 27 24  '$ GLUCOSE 113* 116* 121*  BUN 14 14 32*  CREATININE 1.19 1.13 1.28*  CALCIUM 9.2 8.9 9.1  MG  --  1.9 2.2  PHOS  --   --  3.4    GFR: Estimated Creatinine Clearance: 64.5 mL/min (A) (by C-G formula based on SCr of 1.28 mg/dL (H)).  Liver Function Tests: Recent Labs  Lab 11/24/20 2236 11/26/20 0126  AST 48* 39  ALT 30 28  ALKPHOS 47 42  BILITOT 0.9 0.7  PROT  6.7 5.8*  ALBUMIN 3.7 3.1*    CBG: Recent Labs  Lab 11/25/20 2022 11/26/20 0004 11/26/20 0349 11/26/20 0840 11/26/20 1235  GLUCAP 171* 119* 137* 144* 137*     Recent Results (from the past 240 hour(s))  Resp Panel by RT-PCR (Flu A&B, Covid) Nasopharyngeal Swab     Status: Abnormal   Collection Time: 11/24/20 10:37 PM   Specimen: Nasopharyngeal Swab; Nasopharyngeal(NP) swabs in vial transport medium  Result Value Ref Range Status   SARS Coronavirus 2 by RT PCR POSITIVE (A) NEGATIVE Final    Comment: RESULT CALLED TO, READ BACK BY AND VERIFIED WITH: RN Zoila Shutter [id number] 11/25/20'@00'$ :14 BY TW (NOTE) SARS-CoV-2 target nucleic acids are DETECTED.  The SARS-CoV-2 RNA is generally detectable in upper respiratory specimens during the acute phase of infection. Positive results are indicative of the presence of the identified virus, but do not  rule out bacterial infection or co-infection with other pathogens not detected by the test. Clinical correlation with patient history and other diagnostic information is necessary to determine patient infection status. The expected result is Negative.  Fact Sheet for Patients: EntrepreneurPulse.com.au  Fact Sheet for Healthcare Providers: IncredibleEmployment.be  This test is not yet approved or cleared by the [place] FDA and  has been authorized for detection and/or diagnosis of SARS-CoV-2 by FDA under an Emergency Use Authorization (EUA).  This EUA will remain in effect (meaning this test can  be used) for the duration of  the COVID-19 declaration under Section 564(b)(1) of the Act, 21 U.S.C. section 360bbb-3(b)(1), unless the authorization is terminated or revoked sooner.     Influenza A by PCR NEGATIVE NEGATIVE Final   Influenza B by PCR NEGATIVE NEGATIVE Final    Comment: (NOTE) The Xpert Xpress SARS-CoV-2/FLU/RSV plus assay is intended as an aid in the diagnosis of influenza from Nasopharyngeal  swab specimens and should not be used as a sole basis for treatment. Nasal washings and aspirates are unacceptable for Xpert Xpress SARS-CoV-2/FLU/RSV testing.  Fact Sheet for Patients: EntrepreneurPulse.com.au  Fact Sheet for Healthcare Providers: IncredibleEmployment.be  This test is not yet approved or cleared by the [place] FDA and has been authorized for detection and/or diagnosis of SARS-CoV-2 by FDA under an Emergency Use Authorization (EUA). This EUA will remain in effect (meaning this test can be used) for the duration of the COVID-19 declaration under Section 564(b)(1) of the Act, 21 U.S.C. section 360bbb-3(b)(1), unless the authorization is terminated or revoked.  Performed at [facility], [place] [address], [place], [place] [address]   Blood Culture (routine x 2)     Status: None (Preliminary result)   Collection Time: 11/24/20 10:40 PM   Specimen: BLOOD  Result Value Ref Range Status   Specimen Description BLOOD LEFT ANTECUBITAL  Final   Special Requests   Final    BOTTLES DRAWN AEROBIC AND ANAEROBIC Blood Culture adequate volume   Culture   Final    NO GROWTH < 24 HOURS Performed at [facility], [place] [address], [place], [place] [address]    Report Status PENDING  Incomplete  Blood Culture (routine x 2)     Status: None (Preliminary result)   Collection Time: 11/24/20 11:00 PM   Specimen: BLOOD  Result Value Ref Range Status   Specimen Description BLOOD RIGHT ANTECUBITAL  Final   Special Requests   Final    BOTTLES DRAWN AEROBIC AND ANAEROBIC Blood Culture adequate volume   Culture   Final    NO GROWTH < 24 HOURS Performed at [facility], [place] [address], [place], [place] [address]    Report Status PENDING  Incomplete         Radiology Studies: CT HEAD WO CONTRAST (5MM)  Result Date: 11/25/2020 CLINICAL DATA:  Delirium. EXAM: CT HEAD WITHOUT CONTRAST TECHNIQUE: Contiguous axial images were  obtained from the base of the skull through the vertex without intravenous contrast. COMPARISON:  Head CT dated 12/14/2018. FINDINGS: Brain: Moderate age-related atrophy and chronic microvascular ischemic changes. There is no acute hemorrhage. No mass effect or midline shift. No extra-axial fluid collection. Vascular: No hyperdense vessel or unexpected calcification. Skull: Normal. Negative for fracture or focal lesion. Sinuses/Orbits: Diffuse mucoperiosteal thickening of paranasal sinuses. No air-fluid level. The mastoid air cells are clear. Other: None IMPRESSION: 1. No acute intracranial pathology. 2. Moderate age-related atrophy and chronic microvascular ischemic changes. Electronically  Signed   By: Anner Crete M.D.   On: 11/25/2020 02:44   DG Chest Portable 1 View  Result Date: 11/24/2020 CLINICAL DATA:  Weakness, hypoxia EXAM: PORTABLE CHEST 1 VIEW COMPARISON:  12/14/2018 FINDINGS: Lungs are clear.  No pleural effusion or pneumothorax. Eventration of the right hemidiaphragm. The heart is top-normal in size. Postsurgical changes related to prior CABG. Median sternotomy. IMPRESSION: No evidence of acute cardiopulmonary disease. Electronically Signed   By: Julian Hy M.D.   On: 11/24/2020 23:04        Scheduled Meds:  albuterol  2 puff Inhalation Q6H   allopurinol  200 mg Oral Daily   apixaban  5 mg Oral BID   vitamin C  500 mg Oral Daily   atorvastatin  40 mg Oral Daily   bacitracin   Topical BID   benazepril  15 mg Oral Daily   dexamethasone (DECADRON) injection  6 mg Intravenous Daily   diltiazem  180 mg Oral Daily   dofetilide  500 mcg Oral BID   gabapentin  100 mg Oral QHS   insulin aspart  0-20 Units Subcutaneous Q4H   insulin detemir  0.1 Units/kg Subcutaneous BID   levothyroxine  112 mcg Oral QAC breakfast   linagliptin  5 mg Oral Daily   metFORMIN  500 mg Oral BID WC   metoprolol succinate  25 mg Oral Daily   pantoprazole  40 mg Oral QAC breakfast   sodium  chloride flush  3 mL Intravenous Q12H   traMADol  100 mg Oral BID   zinc sulfate  220 mg Oral Daily   Continuous Infusions:  sodium chloride     remdesivir 100 mg in NS 100 mL 100 mg (11/26/20 1007)     LOS: 1 day     Phillips Climes, MD [facility]   To contact the attending provider between 7A-7P or the covering provider during after hours 7P-7A, please log into the web site [email or web address] and access using universal [place] password for that web site. If you do not have the password, please call the hospital operator.  11/26/2020, 12:51 PM

## 2020-11-26 NOTE — Evaluation (Signed)
Physical Therapy Evaluation Patient Details Name: [name] MRN: [id number] DOB: 1945-09-01 Today's Date: 11/26/2020  History of Present Illness  Pt is a 75 y.o. male admitted 9/10 for covid and generalizd weakness. PMH consists of metabolic encephalopathy, respiratory failure hypoxia and hypercapnia, allergic rhinitis,, paroxysmal atrial fibrillation, CAD/CABG, essential hypertension, chronic diastolic heart failure, chronic pain syndrome, GERD, gout, hemorrhoids, hyperlipidemia, hypogonadism male, insomnia, obstructive sleep apnea, paroxysmal atrial fibrillation, history of other nonhemorrhagic CVA, type 2 diabetes, thoracic aortic atherosclerosis, and vasovagal syncope.   Clinical Impression  Pt admitted with above diagnosis. PTA pt lived at home with his wife, mod I mobility using RW in community. On eval, he required supervision bed mobility, min guard assist transfers, and min guard assist ambulation 25' with RW. Pt currently with functional limitations due to the deficits listed below (see PT Problem List). Pt will benefit from skilled PT to increase their independence and safety with mobility to allow discharge to the venue listed below.  PT to follow acutely. No follow up services indicated.        Recommendations for follow up therapy are one component of a multi-disciplinary discharge planning process, led by the attending physician.  Recommendations may be updated based on patient status, additional functional criteria and insurance authorization.  Follow Up Recommendations No PT follow up;Supervision for mobility/OOB    Equipment Recommendations  Rolling walker with 5" wheels (tall RW, pt is 6'3")    Recommendations for Other Services       Precautions / Restrictions Precautions Precautions: Fall Restrictions Weight Bearing Restrictions: No      Mobility  Bed Mobility Overal bed mobility: Needs Assistance Bed Mobility: Rolling;Supine to Sit;Sit to Supine Rolling:  Modified independent (Device/Increase time)   Supine to sit: Supervision;HOB elevated Sit to supine: Supervision;HOB elevated   General bed mobility comments: supervision for safety, +rail    Transfers Overall transfer level: Needs assistance Equipment used: Rolling walker (2 wheeled) Transfers: Sit to/from Stand Sit to Stand: Min guard         General transfer comment: min guard for safety, no physical assist  Ambulation/Gait Ambulation/Gait assistance: Min guard Gait Distance (Feet): 25 Feet Assistive device: Rolling walker (2 wheeled) Gait Pattern/deviations: Step-through pattern;Decreased stride length Gait velocity: decreased Gait velocity interpretation: <1.31 ft/sec, indicative of household ambulator General Gait Details: steady gait with RW, min guard for safety. Distance limited to room due to covid precautions.  Stairs            Wheelchair Mobility    Modified Rankin (Stroke Patients Only)       Balance Overall balance assessment: Needs assistance Sitting-balance support: No upper extremity supported;Feet supported Sitting balance-Leahy Scale: Good     Standing balance support: Bilateral upper extremity supported;During functional activity Standing balance-Leahy Scale: Poor Standing balance comment: reliant on RW                             Pertinent Vitals/Pain Pain Assessment: No/denies pain    Home Living Family/patient expects to be discharged to:: Private residence Living Arrangements: Spouse/significant other Available Help at Discharge: Family;Available 24 hours/day Type of Home: House Home Access: Stairs to enter Entrance Stairs-Rails: Right;Left;Can reach both Entrance Stairs-Number of Steps: 3 Home Layout: One level Home Equipment: Cane - single point;Shower seat;Hand held shower head;Grab bars - tub/shower;Grab bars - toilet Additional Comments: Pt reports his current RW is broken.    Prior Function Level of  Independence: Independent with  assistive device(s)         Comments: RW for amb in community. Motorized cart in grocery store.     Hand Dominance   Dominant Hand: Right (Since 1996, after his CVA affected Lt side and functional use)    Extremity/Trunk Assessment   Upper Extremity Assessment Upper Extremity Assessment: LUE deficits/detail LUE Deficits / Details: deficits from previous CVA LUE Sensation: decreased light touch;decreased proprioception    Lower Extremity Assessment Lower Extremity Assessment: LLE deficits/detail LLE Deficits / Details: deficits from previous CVA LLE Sensation: decreased light touch;decreased proprioception    Cervical / Trunk Assessment Cervical / Trunk Assessment: Kyphotic  Communication   Communication: No difficulties  Cognition Arousal/Alertness: Awake/alert Behavior During Therapy: WFL for tasks assessed/performed Overall Cognitive Status: Within Functional Limits for tasks assessed                                 General Comments: Pt reports he does feel 'a little foggy.'      General Comments General comments (skin integrity, edema, etc.): VSS on RA    Exercises     Assessment/Plan    PT Assessment Patient needs continued PT services  PT Problem List Decreased mobility;Decreased activity tolerance;Decreased balance       PT Treatment Interventions Therapeutic activities;Gait training;Therapeutic exercise;Patient/family education;Balance training;Functional mobility training    PT Goals (Current goals can be found in the Care Plan section)  Acute Rehab PT Goals Patient Stated Goal: home PT Goal Formulation: With patient Time For Goal Achievement: 12/10/20 Potential to Achieve Goals: Good    Frequency Min 3X/week   Barriers to discharge        Co-evaluation               AM-PAC PT "6 Clicks" Mobility  Outcome Measure Help needed turning from your back to your side while in a flat bed without  using bedrails?: None Help needed moving from lying on your back to sitting on the side of a flat bed without using bedrails?: A Little Help needed moving to and from a bed to a chair (including a wheelchair)?: A Little Help needed standing up from a chair using your arms (e.g., wheelchair or bedside chair)?: A Little Help needed to walk in hospital room?: A Little Help needed climbing 3-5 steps with a railing? : A Little 6 Click Score: 19    End of Session   Activity Tolerance: Patient tolerated treatment well Patient left: in bed;with call bell/phone within reach;with bed alarm set Nurse Communication: Mobility status PT Visit Diagnosis: Difficulty in walking, not elsewhere classified (R26.2)    Time: [id number] PT Time Calculation (min) (ACUTE ONLY): 31 min   Charges:   PT Evaluation $PT Eval Moderate Complexity: 1 Mod PT Treatments $Gait Training: 8-22 mins        Lorrin Goodell, PT  Office # [phone] Pager [phone]   Lorriane Shire 11/26/2020, 12:29 PM

## 2020-11-26 NOTE — Telephone Encounter (Signed)
[name]  I would refill also please let him know through "[name]" he can get dofetilide for < 10$/m  Thanks SK

## 2020-11-27 DIAGNOSIS — I480 Paroxysmal atrial fibrillation: Secondary | ICD-10-CM

## 2020-11-27 DIAGNOSIS — I5032 Chronic diastolic (congestive) heart failure: Secondary | ICD-10-CM

## 2020-11-27 LAB — CBC WITH DIFFERENTIAL/PLATELET
Abs Immature Granulocytes: 0.05 10*3/uL (ref 0.00–0.07)
Basophils Absolute: 0 10*3/uL (ref 0.0–0.1)
Basophils Relative: 0 %
Eosinophils Absolute: 0 10*3/uL (ref 0.0–0.5)
Eosinophils Relative: 0 %
HCT: 36.4 % — ABNORMAL LOW (ref 39.0–52.0)
Hemoglobin: 11.9 g/dL — ABNORMAL LOW (ref 13.0–17.0)
Immature Granulocytes: 1 %
Lymphocytes Relative: 8 %
Lymphs Abs: 0.9 10*3/uL (ref 0.7–4.0)
MCH: 30.7 pg (ref 26.0–34.0)
MCHC: 32.7 g/dL (ref 30.0–36.0)
MCV: 94.1 fL (ref 80.0–100.0)
Monocytes Absolute: 0.5 10*3/uL (ref 0.1–1.0)
Monocytes Relative: 5 %
Neutro Abs: 8.9 10*3/uL — ABNORMAL HIGH (ref 1.7–7.7)
Neutrophils Relative %: 86 %
Platelets: 141 10*3/uL — ABNORMAL LOW (ref 150–400)
RBC: 3.87 MIL/uL — ABNORMAL LOW (ref 4.22–5.81)
RDW: 13.4 % (ref 11.5–15.5)
WBC: 10.3 10*3/uL (ref 4.0–10.5)
nRBC: 0 % (ref 0.0–0.2)

## 2020-11-27 LAB — MAGNESIUM: Magnesium: 2.2 mg/dL (ref 1.7–2.4)

## 2020-11-27 LAB — COMPREHENSIVE METABOLIC PANEL
ALT: 31 U/L (ref 0–44)
AST: 40 U/L (ref 15–41)
Albumin: 3.2 g/dL — ABNORMAL LOW (ref 3.5–5.0)
Alkaline Phosphatase: 40 U/L (ref 38–126)
Anion gap: 9 (ref 5–15)
BUN: 30 mg/dL — ABNORMAL HIGH (ref 8–23)
CO2: 26 mmol/L (ref 22–32)
Calcium: 9.1 mg/dL (ref 8.9–10.3)
Chloride: 103 mmol/L (ref 98–111)
Creatinine, Ser: 1.18 mg/dL (ref 0.61–1.24)
GFR, Estimated: >60 mL/min (ref >60)
Glucose, Bld: 132 mg/dL — ABNORMAL HIGH (ref 70–99)
Potassium: 4.1 mmol/L (ref 3.5–5.1)
Sodium: 138 mmol/L (ref 135–145)
Total Bilirubin: 0.9 mg/dL (ref 0.3–1.2)
Total Protein: 6 g/dL — ABNORMAL LOW (ref 6.5–8.1)

## 2020-11-27 LAB — FERRITIN: Ferritin: 230 ng/mL (ref 24–336)

## 2020-11-27 LAB — GLUCOSE, CAPILLARY
Glucose-Capillary: 121 mg/dL — ABNORMAL HIGH (ref 70–99)
Glucose-Capillary: 136 mg/dL — ABNORMAL HIGH (ref 70–99)
Glucose-Capillary: 138 mg/dL — ABNORMAL HIGH (ref 70–99)
Glucose-Capillary: 145 mg/dL — ABNORMAL HIGH (ref 70–99)

## 2020-11-27 LAB — D-DIMER, QUANTITATIVE: D-Dimer, Quant: 0.32 ug/mL-FEU (ref 0.00–0.50)

## 2020-11-27 LAB — PHOSPHORUS: Phosphorus: 3.7 mg/dL (ref 2.5–4.6)

## 2020-11-27 LAB — C-REACTIVE PROTEIN: CRP: 1.3 mg/dL — ABNORMAL HIGH (ref <1.0)

## 2020-11-27 NOTE — Progress Notes (Signed)
Physical Therapy Treatment Patient Details Name: [name] MRN: [id number] DOB: 1946-02-18 Today's Date: 11/27/2020   History of Present Illness Pt is a 75 y.o. male admitted 9/10 for covid and generalizd weakness. PMH consists of metabolic encephalopathy, respiratory failure hypoxia and hypercapnia, allergic rhinitis,, paroxysmal atrial fibrillation, CAD/CABG, essential hypertension, chronic diastolic heart failure, chronic pain syndrome, GERD, gout, hemorrhoids, hyperlipidemia, hypogonadism male, insomnia, obstructive sleep apnea, paroxysmal atrial fibrillation, history of other nonhemorrhagic CVA, type 2 diabetes, thoracic aortic atherosclerosis, and vasovagal syncope.    PT Comments    Pt with report "I feel much better". Pt with increased ambulation tolerance to 60's with RW. Stayed in room due to COVID protocol. Pt with good home set up and support. RW ordered for patient to take home. Acute PT to cont to follow.    Recommendations for follow up therapy are one component of a multi-disciplinary discharge planning process, led by the attending physician.  Recommendations may be updated based on patient status, additional functional criteria and insurance authorization.  Follow Up Recommendations  No PT follow up;Supervision for mobility/OOB     Equipment Recommendations  Rolling walker with 5" wheels    Recommendations for Other Services       Precautions / Restrictions Precautions Precautions: Fall Restrictions Weight Bearing Restrictions: No     Mobility  Bed Mobility Overal bed mobility: Needs Assistance Bed Mobility: Rolling;Supine to Sit;Sit to Supine Rolling: Supervision   Supine to sit: Supervision;HOB elevated Sit to supine: Supervision;HOB elevated   General bed mobility comments: definite use of bed rail, incresaed time, mildly labored effort/sob, no physical assist needed    Transfers Overall transfer level: Needs assistance Equipment used: Rolling  walker (2 wheeled) Transfers: Sit to/from Stand Sit to Stand: Supervision         General transfer comment: verbal cues for safe hand placement, increased time to power up, no physical assist  Ambulation/Gait Ambulation/Gait assistance: Min guard Gait Distance (Feet): 60 Feet Assistive device: Rolling walker (2 wheeled) Gait Pattern/deviations: Step-through pattern;Decreased stride length Gait velocity: dec Gait velocity interpretation: <1.31 ft/sec, indicative of household ambulator General Gait Details: 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 hindering L foot clearance, pt conscious and lifting L LE up heigh enough to clear it, limited to room due to [employer] protocol   Stairs             Wheelchair Mobility    Modified Rankin (Stroke Patients Only)       Balance Overall balance assessment: Needs assistance Sitting-balance support: No upper extremity supported;Feet supported Sitting balance-Leahy Scale: Good     Standing balance support: Bilateral upper extremity supported;During functional activity Standing balance-Leahy Scale: Poor Standing balance comment: reliant on RW                            Cognition Arousal/Alertness: Awake/alert Behavior During Therapy: WFL for tasks assessed/performed Overall Cognitive Status: Within Functional Limits for tasks assessed                                 General Comments: Pt upset as it was 10am and he hasn't received his 8am medicine and is really concerned about not receiving his heart medicine that keeps it in sinus rythim, RN came in at end of PT session with medications      Exercises      General Comments General comments (  skin integrity, edema, etc.): VSS on RA      Pertinent Vitals/Pain Pain Assessment: No/denies pain    Home Living                      Prior Function            PT Goals (current goals can now be found in the care plan section) Acute Rehab  PT Goals Patient Stated Goal: home today Progress towards PT goals: Progressing toward goals    Frequency    Min 3X/week      PT Plan Current plan remains appropriate    Co-evaluation              AM-PAC PT "6 Clicks" Mobility   Outcome Measure  Help needed turning from your back to your side while in a flat bed without using bedrails?: None Help needed moving from lying on your back to sitting on the side of a flat bed without using bedrails?: A Little Help needed moving to and from a bed to a chair (including a wheelchair)?: A Little Help needed standing up from a chair using your arms (e.g., wheelchair or bedside chair)?: A Little Help needed to walk in hospital room?: A Little Help needed climbing 3-5 steps with a railing? : A Little 6 Click Score: 19    End of Session   Activity Tolerance: Patient tolerated treatment well Patient left: in bed;with call bell/phone within reach;with bed alarm set;with nursing/sitter in room Nurse Communication: Mobility status PT Visit Diagnosis: Difficulty in walking, not elsewhere classified (R26.2)     Time: 1010-1032 PT Time Calculation (min) (ACUTE ONLY): 22 min  Charges:  $Gait Training: 8-22 mins                     I witnessed patient signing consent to Medical Procedure and Treatment form.    Abayomi Pattison M Toinette Lackie 11/27/2020, 12:52 PM

## 2020-11-27 NOTE — Discharge Summary (Addendum)
Physician Discharge Summary  Navi Erber WRU:[id number] DOB: Oct 22, 1945 DOA: 11/24/2020  PCP: Debbrah Alar, NP  Admit date: 11/24/2020 Discharge date: 11/27/2020  Admitted From:Home Disposition:  Home  Recommendations for Outpatient Follow-up:  Follow up with PCP in 2 weeks Please obtain BMP/CBC in one week  Home Health:NO Equipment/Devices:Walker  Discharge Condition:Stable CODE STATUS:FULL Diet recommendation: Heart Healthy / Carb Modified / Regular / Dysphagia   Brief/Interim Summary:  [name] is a 75 y.o. male with medical history significant of metabolic encephalopathy, respiratory failure hypoxia and hypercapnia, allergic rhinitis,, paroxysmal atrial fibrillation, CAD/CABG, essential hypertension, chronic diastolic heart failure, chronic pain syndrome, GERD, gout, hemorrhoids, hyperlipidemia, hypogonadism male, insomnia, obstructive sleep apnea not available using CPAP, paroxysmal atrial fibrillation, history of other nonhemorrhagic CVA, type 2 diabetes, thoracic aortic atherosclerosis, vasovagal syncope who is coming to the emergency department brought by EMS after his family called due to generalized weakness.  Patient was noted to be febrile 101.5, he is diagnosed with COVID-19, admitted for further work-up.       COVID 2019 novel coronavirus disease (COVID-19) -Patient overall with generalized weakness, extremely frail, he has history of hypertension, diabetes mellitus, CHF, obesity and CAD, high risk of complications from [id number], so he was admitted for treatment, he did receive total of 3 days of IV remdesivir, he was on steroids during hospital stay as well, but no indication to continue steroids on discharge specially there is no hypoxia . -Patient was encouraged to keep his incentive spirometer and flutter valve and keep using it at home.       Paroxysmal atrial fibrillation (HCC) CHA?DS?-VASc Score of 6. Continue Cardizem 180 mg p.o. daily. Continue  metoprolol 25 mg p.o. daily. Continue Tikosyn 500 mg p.o. twice daily. Continue apixaban for anticoagulation purposes.     Hypothyroidism Continue levothyroxine 112 mcg p.o. daily.     Hyperlipidemia LDL goal <70 Continue atorvastatin 40 mg p.o. daily.     GOUT Continue allopurinol 200 mg p.o. daily.     Essential hypertension Continue benazepril 50 mg p.o. daily. Continue Cardizem 180 mg p.o. daily. Continue metoprolol 25 mg p.o. daily. Monitor BP, HR, renal function and electrolytes.     GERD Continue pantoprazole 40 mg p.o. daily.     Type 2 diabetes mellitus without complication,    without long-term current use of insulin (HCC) Continue with home medications     Chronic diastolic heart failure (HCC) No signs of decompensation. Continue benazepril, furosemide and metoprolol.     CAD S/P CFX DES 2011 Home apixaban. Continue statin and beta-blocker. Sublingual NTG as needed.     Class 1 obesity Lifestyle modifications. To be discussed with outpatient primary.     Normocytic anemia Monitor hematocrit and hemoglobin. Transfuse if needed. Follow-up with PCP.     Obstructive sleep apnea-Failed CPAP Not on CPAP.    Discharge Diagnoses:  Principal Problem:   2019 novel coronavirus disease (COVID-19) Active Problems:   Hypothyroidism   Hyperlipidemia LDL goal <70   GOUT   Obstructive sleep apnea-Failed CPAP   Essential hypertension   GERD   Type 2 diabetes mellitus without complication, without long-term current use of insulin (HCC)   Chronic diastolic heart failure (HCC)   CAD S/P CFX DES 2011   Paroxysmal atrial fibrillation (HCC)   Class 1 obesity   Normocytic anemia    Discharge Instructions  Discharge Instructions     Diet - low sodium heart healthy   Complete by: As directed    Discharge  instructions   Complete by: As directed    Follow with Primary MD Debbrah Alar, NP in 10 days   Get CBC, CMP, 2 view Chest X ray checked  by  Primary MD next visit.    Activity: As tolerated with Full fall precautions use walker/cane & assistance as needed   Disposition Home    Diet: Heart Healthy .   On your next visit with your primary care physician please Get Medicines reviewed and adjusted.   Please request your Prim.MD to go over all Hospital Tests and Procedure/Radiological results at the follow up, please get all Hospital records sent to your Prim MD by signing hospital release before you go home.   If you experience worsening of your admission symptoms, develop shortness of breath, life threatening emergency, suicidal or homicidal thoughts you must seek medical attention immediately by calling 911 or calling your MD immediately  if symptoms less severe.  You Must read complete instructions/literature along with all the possible adverse reactions/side effects for all the Medicines you take and that have been prescribed to you. Take any new Medicines after you have completely understood and accpet all the possible adverse reactions/side effects.   Do not drive, operating heavy machinery, perform activities at heights, swimming or participation in water activities or provide baby sitting services if your were admitted for syncope or siezures until you have seen by Primary MD or a Neurologist and advised to do so again.  Do not drive when taking Pain medications.    Do not take more than prescribed Pain, Sleep and Anxiety Medications  Special Instructions: If you have smoked or chewed Tobacco  in the last 2 yrs please stop smoking, stop any regular Alcohol  and or any Recreational drug use.  Wear Seat belts while driving.   Please note  You were cared for by a hospitalist during your hospital stay. If you have any questions about your discharge medications or the care you received while you were in the hospital after you are discharged, you can call the unit and asked to speak with the hospitalist on call if the  hospitalist that took care of you is not available. Once you are discharged, your primary care physician will handle any further medical issues. Please note that NO REFILLS for any discharge medications will be authorized once you are discharged, as it is imperative that you return to your primary care physician (or establish a relationship with a primary care physician if you do not have one) for your aftercare needs so that they can reassess your need for medications and monitor your lab values.   Increase activity slowly   Complete by: As directed       Allergies as of 11/27/2020   No Known Allergies      Medication List     TAKE these medications    Accu-Chek Guide test strip Generic drug: glucose blood USE AS DIRECTED UP TO 4 TIMES DAILY   allopurinol 100 MG tablet Commonly known as: ZYLOPRIM TAKE 2 TABLETS BY MOUTH EVERY DAY What changed:  how much to take when to take this   atorvastatin 40 MG tablet Commonly known as: LIPITOR TAKE 1 TABLET BY MOUTH EVERY DAY   benazepril 5 MG tablet Commonly known as: LOTENSIN TAKE 1 TABLET BY MOUTH DAILY WITH 10MG OF BENAZEPRIL TO EQUAL 15MG DAILY   benazepril 10 MG tablet Commonly known as: LOTENSIN TAKE 1 TABLET (10 MG TOTAL) BY MOUTH DAILY. TAKE WITH ADDITIONAL 5MG  TABLET OF BENAZEPRIL DAILY   blood glucose meter kit and supplies Kit Dispense based on patient and insurance preference. Use up to four times daily as directed. (FOR ICD-9 250.00, 250.01).   calcium carbonate 500 MG chewable tablet Commonly known as: TUMS - dosed in mg elemental calcium Chew 1 tablet by mouth daily as needed for indigestion or heartburn.   cyanocobalamin 1000 MCG tablet Take 2,000 mcg by mouth daily. Take 2 Gummies daily   diclofenac Sodium 1 % Gel Commonly known as: VOLTAREN USE AS DIRECTED   diltiazem 180 MG 24 hr capsule Commonly known as: CARDIZEM CD TAKE 1 CAPSULE BY MOUTH EVERY DAY What changed: how much to take   dofetilide 500  MCG capsule Commonly known as: TIKOSYN TAKE 1 CAPSULE BY MOUTH TWICE A DAY   Eliquis 5 MG Tabs tablet Generic drug: apixaban TAKE 1 TABLET BY MOUTH TWICE A DAY   ferrous sulfate 325 (65 FE) MG tablet Take 1 tablet (325 mg total) by mouth 2 (two) times daily with a meal. What changed:  how much to take when to take this   fluticasone 50 MCG/ACT nasal spray Commonly known as: FLONASE Place 2 sprays into both nostrils as needed for allergies or rhinitis.   folic acid 1 MG tablet Commonly known as: FOLVITE TAKE 1 TABLET BY MOUTH EVERY DAY   furosemide 40 MG tablet Commonly known as: LASIX TAKE 1 TABLET (40 MG TOTAL) BY MOUTH DAILY AS NEEDED FOR FLUID OR EDEMA.   gabapentin 100 MG capsule Commonly known as: NEURONTIN TAKE 1 CAPSULE BY MOUTH AT BEDTIME. What changed:  when to take this Another medication with the same name was removed. Continue taking this medication, and follow the directions you see here.   levothyroxine 112 MCG tablet Commonly known as: SYNTHROID TAKE 1 TABLET BY MOUTH DAILY BEFORE BREAKFAST.   metFORMIN 500 MG tablet Commonly known as: GLUCOPHAGE TAKE 1 TABLET BY MOUTH EVERY DAY WITH BREAKFAST   metoprolol succinate 25 MG 24 hr tablet Commonly known as: TOPROL-XL Take 1 tablet (25 mg total) by mouth daily. Please make overdue appt with Dr. Caryl Comes before anymore refills. Thank you 1st attempt   MULTIVITAMIN GUMMIES MENS PO Take 2 tablets by mouth daily with breakfast.   nitroGLYCERIN 0.4 MG SL tablet Commonly known as: NITROSTAT Place 1 tablet (0.4 mg total) under the tongue every 5 (five) minutes as needed for chest pain. Up to 3 doses   OXYGEN Inhale 2 L into the lungs at bedtime.   pantoprazole 40 MG tablet Commonly known as: PROTONIX Take 1 tablet (40 mg total) by mouth daily before breakfast.   sennosides-docusate sodium 8.6-50 MG tablet Commonly known as: SENOKOT-S Take 1 tablet by mouth as needed for constipation.   traMADol 200 MG 24  hr tablet Commonly known as: ULTRAM-ER Take 1 tablet (200 mg total) by mouth daily.   traMADol 50 MG tablet Commonly known as: ULTRAM TAKE 1 TABLET BY MOUTH TWICE A DAY               Durable Medical Equipment  (From admission, onward)           Start     Ordered   11/27/20 0949  DME Gilford Rile  Once       Question Answer Comment  Walker: With 5 Inch Wheels   Patient needs a walker to treat with the following condition Weakness      11/27/20 0951  Follow-up Information     Debbrah Alar, NP Follow up in 2 week(s).   Specialty: Internal Medicine Contact information: [place] [address] [phone]                No Known Allergies  Consultations: None   Procedures/Studies: CT HEAD WO CONTRAST (5MM)  Result Date: 11/25/2020 CLINICAL DATA:  Delirium. EXAM: CT HEAD WITHOUT CONTRAST TECHNIQUE: Contiguous axial images were obtained from the base of the skull through the vertex without intravenous contrast. COMPARISON:  Head CT dated 12/14/2018. FINDINGS: Brain: Moderate age-related atrophy and chronic microvascular ischemic changes. There is no acute hemorrhage. No mass effect or midline shift. No extra-axial fluid collection. Vascular: No hyperdense vessel or unexpected calcification. Skull: Normal. Negative for fracture or focal lesion. Sinuses/Orbits: Diffuse mucoperiosteal thickening of paranasal sinuses. No air-fluid level. The mastoid air cells are clear. Other: None IMPRESSION: 1. No acute intracranial pathology. 2. Moderate age-related atrophy and chronic microvascular ischemic changes. Electronically Signed   By: Anner Crete M.D.   On: 11/25/2020 02:44   DG Chest Portable 1 View  Result Date: 11/24/2020 CLINICAL DATA:  Weakness, hypoxia EXAM: PORTABLE CHEST 1 VIEW COMPARISON:  12/14/2018 FINDINGS: Lungs are clear.  No pleural effusion or pneumothorax. Eventration of the right hemidiaphragm. The heart  is top-normal in size. Postsurgical changes related to prior CABG. Median sternotomy. IMPRESSION: No evidence of acute cardiopulmonary disease. Electronically Signed   By: Julian Hy M.D.   On: 11/24/2020 23:04      Subjective:  Patient denies any dyspnea, fever, chills or cough, eager to go home today.  Discharge Exam: Vitals:   11/27/20 0355 11/27/20 0744  BP: 129/64 132/66  Pulse: (!) 58 (!) 54  Resp: 16 16  Temp:  97.6 F (36.4 C)  SpO2: 97% 96%   Vitals:   11/26/20 2205 11/26/20 2329 11/27/20 0355 11/27/20 0744  BP:  120/67 129/64 132/66  Pulse: 68 65 (!) 58 (!) 54  Resp: _0 Temp:  97.9 F (36.6 C)  97.6 F (36.4 C)  TempSrc:  Oral  Oral  SpO2: 96% 96% 97% 96%  Weight:      Height:        General: Pt is alert, awake, not in acute distress Cardiovascular: RRR, S1/S2 +, no rubs, no gallops Respiratory: CTA bilaterally, no wheezing, no rhonchi Abdominal: Soft, NT, ND, bowel sounds + Extremities: no edema, no cyanosis    The results of significant diagnostics from this hospitalization (including imaging, microbiology, ancillary and laboratory) are listed below for reference.     Microbiology: Recent Results (from the past 240 hour(s))  Resp Panel by RT-PCR (Flu A&B, Covid) Nasopharyngeal Swab     Status: Abnormal   Collection Time: 11/24/20 10:37 PM   Specimen: Nasopharyngeal Swab; Nasopharyngeal(NP) swabs in vial transport medium  Result Value Ref Range Status   SARS Coronavirus 2 by RT PCR POSITIVE (A) NEGATIVE Final    Comment: RESULT CALLED TO, READ BACK BY AND VERIFIED WITH: RN Zoila Shutter ZTIWP 11/25/20_1 :14 BY TW (NOTE) SARS-CoV-2 target nucleic acids are DETECTED.  The SARS-CoV-2 RNA is generally detectable in upper respiratory specimens during the acute phase of infection. Positive results are indicative of the presence of the identified virus, but do not rule out bacterial infection or co-infection with other pathogens not detected by  the test. Clinical correlation with patient history and other diagnostic information is necessary to determine patient infection status. The expected result  is Negative.  Fact Sheet for Patients: EntrepreneurPulse.com.au  Fact Sheet for Healthcare Providers: IncredibleEmployment.be  This test is not yet approved or cleared by the [place] FDA and  has been authorized for detection and/or diagnosis of SARS-CoV-2 by FDA under an Emergency Use Authorization (EUA).  This EUA will remain in effect (meaning this test can  be used) for the duration of  the COVID-19 declaration under Section 564(b)(1) of the Act, 21 U.S.C. section 360bbb-3(b)(1), unless the authorization is terminated or revoked sooner.     Influenza A by PCR NEGATIVE NEGATIVE Final   Influenza B by PCR NEGATIVE NEGATIVE Final    Comment: (NOTE) The Xpert Xpress SARS-CoV-2/FLU/RSV plus assay is intended as an aid in the diagnosis of influenza from Nasopharyngeal swab specimens and should not be used as a sole basis for treatment. Nasal washings and aspirates are unacceptable for Xpert Xpress SARS-CoV-2/FLU/RSV testing.  Fact Sheet for Patients: EntrepreneurPulse.com.au  Fact Sheet for Healthcare Providers: IncredibleEmployment.be  This test is not yet approved or cleared by the [place] FDA and has been authorized for detection and/or diagnosis of SARS-CoV-2 by FDA under an Emergency Use Authorization (EUA). This EUA will remain in effect (meaning this test can be used) for the duration of the COVID-19 declaration under Section 564(b)(1) of the Act, 21 U.S.C. section 360bbb-3(b)(1), unless the authorization is terminated or revoked.  Performed at [facility], [place] [address] [place], [place] [address]   Blood Culture (routine x 2)     Status: None (Preliminary result)   Collection Time: 11/24/20 10:40 PM   Specimen: BLOOD   Result Value Ref Range Status   Specimen Description BLOOD LEFT ANTECUBITAL  Final   Special Requests   Final    BOTTLES DRAWN AEROBIC AND ANAEROBIC Blood Culture adequate volume   Culture   Final    NO GROWTH 3 DAYS Performed at [facility], [place] [address], [place], [place] [address]    Report Status PENDING  Incomplete  Blood Culture (routine x 2)     Status: None (Preliminary result)   Collection Time: 11/24/20 11:00 PM   Specimen: BLOOD  Result Value Ref Range Status   Specimen Description BLOOD RIGHT ANTECUBITAL  Final   Special Requests   Final    BOTTLES DRAWN AEROBIC AND ANAEROBIC Blood Culture adequate volume   Culture   Final    NO GROWTH 3 DAYS Performed at [facility], [place] [address], [place],  [address]    Report Status PENDING  Incomplete     Labs: BNP (last 3 results) Recent Labs    11/24/20 2236  BNP 482.5*   Basic Metabolic Panel: Recent Labs  Lab 11/24/20 2236 11/25/20 0127 11/26/20 0126 11/27/20 0051  NA 138 138 136 138  K 5.4* 4.4 4.2 4.1  CL 102 102 103 103  CO2 _0 GLUCOSE 113* 116* 121* 132*  BUN 14 14 32* 30*  CREATININE 1.19 1.13 1.28* 1.18  CALCIUM 9.2 8.9 9.1 9.1  MG  --  1.9 2.2 2.2  PHOS  --   --  3.4 3.7   Liver Function Tests: Recent Labs  Lab 11/24/20 2236 11/26/20 0126 11/27/20 0051  AST 48* 39 40  ALT _1 ALKPHOS 47 42 40  BILITOT 0.9 0.7 0.9  PROT 6.7 5.8* 6.0*  ALBUMIN 3.7 3.1* 3.2*   No results for input(s): LIPASE, AMYLASE in the last 168 hours. No results  for input(s): AMMONIA in the last 168 hours. CBC: Recent Labs  Lab 11/24/20 2236 11/26/20 0126 11/27/20 0051  WBC 7.7 9.8 10.3  NEUTROABS 6.0 8.0* 8.9*  HGB 12.9* 11.5* 11.9*  HCT 40.7 35.1* 36.4*  MCV 96.4 94.4 94.1  PLT 135* 125* 141*   Cardiac Enzymes: No results for input(s): CKTOTAL, CKMB, CKMBINDEX, TROPONINI in the last 168 hours. BNP: Invalid input(s): POCBNP CBG: Recent Labs  Lab 11/26/20 1722  11/26/20 2035 11/27/20 0055 11/27/20 0536 11/27/20 0746  GLUCAP 122* 119* 136* 138* 145*   D-Dimer Recent Labs    11/26/20 0126 11/27/20 0051  DDIMER <0.27 0.32   Hgb A1c No results for input(s): HGBA1C in the last 72 hours. Lipid Profile No results for input(s): CHOL, HDL, LDLCALC, TRIG, CHOLHDL, LDLDIRECT in the last 72 hours. Thyroid function studies No results for input(s): TSH, T4TOTAL, T3FREE, THYROIDAB in the last 72 hours.  Invalid input(s): FREET3 Anemia work up Recent Labs    11/26/20 0126 11/27/20 0051  FERRITIN 224 230   Urinalysis    Component Value Date/Time   COLORURINE YELLOW 11/24/2020 2236   APPEARANCEUR CLEAR 11/24/2020 2236   LABSPEC >1.030 (H) 11/24/2020 2236   PHURINE 5.5 11/24/2020 2236   GLUCOSEU NEGATIVE 11/24/2020 2236   HGBUR NEGATIVE 11/24/2020 2236   BILIRUBINUR NEGATIVE 11/24/2020 2236   BILIRUBINUR neg 04/27/2017 1258   KETONESUR NEGATIVE 11/24/2020 2236   PROTEINUR NEGATIVE 11/24/2020 2236   UROBILINOGEN 0.2 04/27/2017 1258   UROBILINOGEN 1.0 11/25/2014 1806   NITRITE NEGATIVE 11/24/2020 2236   LEUKOCYTESUR NEGATIVE 11/24/2020 2236   Sepsis Labs Invalid input(s): PROCALCITONIN,  WBC,  LACTICIDVEN Microbiology Recent Results (from the past 240 hour(s))  Resp Panel by RT-PCR (Flu A&B, Covid) Nasopharyngeal Swab     Status: Abnormal   Collection Time: 11/24/20 10:37 PM   Specimen: Nasopharyngeal Swab; Nasopharyngeal(NP) swabs in vial transport medium  Result Value Ref Range Status   SARS Coronavirus 2 by RT PCR POSITIVE (A) NEGATIVE Final    Comment: RESULT CALLED TO, READ BACK BY AND VERIFIED WITH: RN SOPHIW WIRTZ 11/25/20_0 :14 BY TW (NOTE) SARS-CoV-2 target nucleic acids are DETECTED.  The SARS-CoV-2 RNA is generally detectable in upper respiratory specimens during the acute phase of infection. Positive results are indicative of the presence of the identified virus, but do not rule out bacterial infection or co-infection  with other pathogens not detected by the test. Clinical correlation with patient history and other diagnostic information is necessary to determine patient infection status. The expected result is Negative.  Fact Sheet for Patients: EntrepreneurPulse.com.au  Fact Sheet for Healthcare Providers: IncredibleEmployment.be  This test is not yet approved or cleared by the [place] FDA and  has been authorized for detection and/or diagnosis of SARS-CoV-2 by FDA under an Emergency Use Authorization (EUA).  This EUA will remain in effect (meaning this test can  be used) for the duration of  the COVID-19 declaration under Section 564(b)(1) of the Act, 21 U.S.C. section 360bbb-3(b)(1), unless the authorization is terminated or revoked sooner.     Influenza A by PCR NEGATIVE NEGATIVE Final   Influenza B by PCR NEGATIVE NEGATIVE Final    Comment: (NOTE) The Xpert Xpress SARS-CoV-2/FLU/RSV plus assay is intended as an aid in the diagnosis of influenza from Nasopharyngeal swab specimens and should not be used as a sole basis for treatment. Nasal washings and aspirates are unacceptable for Xpert Xpress SARS-CoV-2/FLU/RSV testing.  Fact Sheet for Patients: EntrepreneurPulse.com.au  Fact Sheet for Healthcare Providers: IncredibleEmployment.be  This test is not yet approved or cleared by the [place] and has been authorized for detection and/or diagnosis of SARS-CoV-2 by FDA under an Emergency Use Authorization (EUA). This EUA will remain in effect (meaning this test can be used) for the duration of the COVID-19 declaration under Section 564(b)(1) of the Act, 21 U.S.C. section 360bbb-3(b)(1), unless the authorization is terminated or revoked.  Performed at [facility], [place] [address], [place], [place] [address]   Blood Culture (routine x 2)     Status: None (Preliminary result)   Collection Time:  11/24/20 10:40 PM   Specimen: BLOOD  Result Value Ref Range Status   Specimen Description BLOOD LEFT ANTECUBITAL  Final   Special Requests   Final    BOTTLES DRAWN AEROBIC AND ANAEROBIC Blood Culture adequate volume   Culture   Final    NO GROWTH 3 DAYS Performed at [facility], [place] [address], [place], [place] [address]    Report Status PENDING  Incomplete  Blood Culture (routine x 2)     Status: None (Preliminary result)   Collection Time: 11/24/20 11:00 PM   Specimen: BLOOD  Result Value Ref Range Status   Specimen Description BLOOD RIGHT ANTECUBITAL  Final   Special Requests   Final    BOTTLES DRAWN AEROBIC AND ANAEROBIC Blood Culture adequate volume   Culture   Final    NO GROWTH 3 DAYS Performed at [facility], [place] [address], [place],  [address]    Report Status PENDING  Incomplete     Time coordinating discharge: Over 30 minutes  SIGNED:   Phillips Climes, MD  [facility] 11/27/2020, 11:07 AM Pager   If 7PM-7AM, please contact night-coverage [email or web address] Password [email or web address]

## 2020-11-27 NOTE — Discharge Instructions (Signed)
Person Under Monitoring Name: [name]  Location: [address] [place] [address]   Infection Prevention Recommendations for Individuals Confirmed to have, or Being Evaluated for, 2019 Novel Coronavirus (COVID-19) Infection Who Receive Care at Home  Individuals who are confirmed to have, or are being evaluated for, COVID-19 should follow the prevention steps below until a healthcare provider or local or state health department says they can return to normal activities.  Stay home except to get medical care You should restrict activities outside your home, except for getting medical care. Do not go to work, school, or public areas, and do not use public transportation or taxis.  Call ahead before visiting your doctor Before your medical appointment, call the healthcare provider and tell them that you have, or are being evaluated for, COVID-19 infection. This will help the healthcare provider's office take steps to keep other people from getting infected. Ask your healthcare provider to call the local or state health department.  Monitor your symptoms Seek prompt medical attention if your illness is worsening (e.g., difficulty breathing). Before going to your medical appointment, call the healthcare provider and tell them that you have, or are being evaluated for, COVID-19 infection. Ask your healthcare provider to call the local or state health department.  Wear a facemask You should wear a facemask that covers your nose and mouth when you are in the same room with other people and when you visit a healthcare provider. People who live with or visit you should also wear a facemask while they are in the same room with you.  Separate yourself from other people in your home As much as possible, you should stay in a different room from other people in your home. Also, you should use a separate bathroom, if available.  Avoid sharing household items You  should not share dishes, drinking glasses, cups, eating utensils, towels, bedding, or other items with other people in your home. After using these items, you should wash them thoroughly with soap and water.  Cover your coughs and sneezes Cover your mouth and nose with a tissue when you cough or sneeze, or you can cough or sneeze into your sleeve. Throw used tissues in a lined trash can, and immediately wash your hands with soap and water for at least 20 seconds or use an alcohol-based hand rub.  Wash your [facility] your hands often and thoroughly with soap and water for at least 20 seconds. You can use an alcohol-based hand sanitizer if soap and water are not available and if your hands are not visibly dirty. Avoid touching your eyes, nose, and mouth with unwashed hands.   Prevention Steps for Caregivers and Household Members of Individuals Confirmed to have, or Being Evaluated for, COVID-19 Infection Being Cared for in the Home  If you live with, or provide care at home for, a person confirmed to have, or being evaluated for, COVID-19 infection please follow these guidelines to prevent infection:  Follow healthcare provider's instructions Make sure that you understand and can help the patient follow any healthcare provider instructions for all care.  Provide for the patient's basic needs You should help the patient with basic needs in the home and provide support for getting groceries, prescriptions, and other personal needs.  Monitor the patient's symptoms If they are getting sicker, call his or her medical provider and tell them that the patient has, or is being evaluated for, COVID-19 infection. This will help the healthcare  provider's office take steps to keep other people from getting infected. Ask the healthcare provider to call the local or state health department.  Limit the number of people who have contact with the patient If possible, have only one caregiver for the  patient. Other household members should stay in another home or place of residence. If this is not possible, they should stay in another room, or be separated from the patient as much as possible. Use a separate bathroom, if available. Restrict visitors who do not have an essential need to be in the home.  Keep older adults, very young children, and other sick people away from the patient Keep older adults, very young children, and those who have compromised immune systems or chronic health conditions away from the patient. This includes people with chronic heart, lung, or kidney conditions, diabetes, and cancer.  Ensure good ventilation Make sure that shared spaces in the home have good air flow, such as from an air conditioner or an opened window, weather permitting.  Wash your hands often Wash your hands often and thoroughly with soap and water for at least 20 seconds. You can use an alcohol based hand sanitizer if soap and water are not available and if your hands are not visibly dirty. Avoid touching your eyes, nose, and mouth with unwashed hands. Use disposable paper towels to dry your hands. If not available, use dedicated cloth towels and replace them when they become wet.  Wear a facemask and gloves Wear a disposable facemask at all times in the room and gloves when you touch or have contact with the patient's blood, body fluids, and/or secretions or excretions, such as sweat, saliva, sputum, nasal mucus, vomit, urine, or feces.  Ensure the mask fits over your nose and mouth tightly, and do not touch it during use. Throw out disposable facemasks and gloves after using them. Do not reuse. Wash your hands immediately after removing your facemask and gloves. If your personal clothing becomes contaminated, carefully remove clothing and launder. Wash your hands after handling contaminated clothing. Place all used disposable facemasks, gloves, and other waste in a lined container before  disposing them with other household waste. Remove gloves and wash your hands immediately after handling these items.  Do not share dishes, glasses, or other household items with the patient Avoid sharing household items. You should not share dishes, drinking glasses, cups, eating utensils, towels, bedding, or other items with a patient who is confirmed to have, or being evaluated for, COVID-19 infection. After the person uses these items, you should wash them thoroughly with soap and water.  Wash laundry thoroughly Immediately remove and wash clothes or bedding that have blood, body fluids, and/or secretions or excretions, such as sweat, saliva, sputum, nasal mucus, vomit, urine, or feces, on them. Wear gloves when handling laundry from the patient. Read and follow directions on labels of laundry or clothing items and detergent. In general, wash and dry with the warmest temperatures recommended on the label.  Clean all areas the individual has used often Clean all touchable surfaces, such as counters, tabletops, doorknobs, bathroom fixtures, toilets, phones, keyboards, tablets, and bedside tables, every day. Also, clean any surfaces that may have blood, body fluids, and/or secretions or excretions on them. Wear gloves when cleaning surfaces the patient has come in contact with. Use a diluted bleach solution (e.g., dilute bleach with 1 part bleach and 10 parts water) or a household disinfectant with a label that says EPA-registered for coronaviruses. To make  a bleach solution at home, add 1 tablespoon of bleach to 1 quart (4 cups) of water. For a larger supply, add  cup of bleach to 1 gallon (16 cups) of water. Read labels of cleaning products and follow recommendations provided on product labels. Labels contain instructions for safe and effective use of the cleaning product including precautions you should take when applying the product, such as wearing gloves or eye protection and making sure you  have good ventilation during use of the product. Remove gloves and wash hands immediately after cleaning.  Monitor yourself for signs and symptoms of illness Caregivers and household members are considered close contacts, should monitor their health, and will be asked to limit movement outside of the home to the extent possible. Follow the monitoring steps for close contacts listed on the symptom monitoring form.   ? If you have additional questions, contact your local health department or call the epidemiologist on call at [phone] (available 24/7). ? This guidance is subject to change. For the most up-to-date guidance from [facility], please refer to their website: [email or web address]

## 2020-11-27 NOTE — Care Management (Signed)
Consult cancelled for Tikosyn initiation as patient was taking it prior to admission.

## 2020-11-27 NOTE — Plan of Care (Signed)

## 2020-11-27 NOTE — TOC Transition Note (Signed)
Transition of Care [facility]) - CM/SW Discharge Note   Patient Details  Name: [name] MRN: [id number] Date of Birth: 1945/08/03  Transition of Care [facility]) CM/SW Contact:  Carles Collet, RN Phone Number: 11/27/2020, 10:20 AM   Clinical Narrative:     RW ordered through [employer], verified standard RW would accommodate his height of 6'3"          Patient Goals and CMS Choice        Discharge Placement                       Discharge Plan and Services                DME Arranged: Walker rolling DME Agency: [employer] Date DME Agency Contacted: 11/27/20 Time DME Agency Contacted: 30 Representative spoke with at DME Agency: [place]) Interventions     Readmission Risk Interventions No flowsheet data found.

## 2020-11-29 ENCOUNTER — Other Ambulatory Visit: Payer: Self-pay | Admitting: Family

## 2020-11-29 ENCOUNTER — Other Ambulatory Visit: Payer: Self-pay | Admitting: Physician Assistant

## 2020-11-29 ENCOUNTER — Other Ambulatory Visit: Payer: Self-pay | Admitting: Cardiology

## 2020-11-29 ENCOUNTER — Telehealth: Payer: Self-pay

## 2020-11-29 ENCOUNTER — Other Ambulatory Visit: Payer: Self-pay | Admitting: Internal Medicine

## 2020-11-29 LAB — CULTURE, BLOOD (ROUTINE X 2)
Culture: NO GROWTH
Culture: NO GROWTH
Special Requests: ADEQUATE
Special Requests: ADEQUATE

## 2020-11-29 MED ORDER — DOFETILIDE 500 MCG PO CAPS: 500.0000 ug | ORAL_CAPSULE | Freq: Two times a day (BID) | ORAL | 1 refills | Status: DC

## 2020-11-29 MED ORDER — DILTIAZEM HCL ER COATED BEADS 180 MG PO CP24
180.0000 mg | ORAL_CAPSULE | Freq: Every day | ORAL | 1 refills | Status: DC
Start: 2020-11-29 — End: 2021-02-22

## 2020-11-29 NOTE — Progress Notes (Signed)
Patient prefers all medications to go to [facility] on [address].

## 2020-11-29 NOTE — Telephone Encounter (Signed)
Prescription refill request for Eliquis received. Indication: Afib  Last office visit: 12/13/19 Marlene Lard, PA)  Scr: 1.18 (11/27/20) Age: 76 Weight: 109kg  Appropriate dose and refill sent to requested pharmacy.

## 2020-11-29 NOTE — Telephone Encounter (Signed)
Transition Care Management Unsuccessful Follow-up Telephone Call  Date of discharge and from where:  11/27/2020  Attempts:  2nd Attempt  Reason for unsuccessful TCM follow-up call:  No answer/busy

## 2020-11-29 NOTE — Addendum Note (Signed)
Addended by: Claude Manges on: 11/29/2020 10:19 AM   Modules accepted: Orders

## 2020-11-29 NOTE — Telephone Encounter (Signed)
Transition Care Management Follow-up Telephone Call Date of discharge and from where: 11/27/2020-[place] How have you been since you were released from the hospital? Not up to par but better than I was. Any questions or concerns? No  Items Reviewed: Did the pt receive and understand the discharge instructions provided? Yes  Medications obtained and verified? Yes  Other? Yes  Any new allergies since your discharge? No  Dietary orders reviewed? Yes Do you have support at home? Yes   Home Care and Equipment/Supplies: Were home health services ordered? no If so, what is the name of the agency? N/a  Has the agency set up a time to come to the patient's home? not applicable Were any new equipment or medical supplies ordered?  Yes: walker What is the name of the medical supply agency? Obtained from the hospital Were you able to get the supplies/equipment? yes Do you have any questions related to the use of the equipment or supplies? No  Functional Questionnaire: (I = Independent and D = Dependent) ADLs: I  Bathing/Dressing- I  Meal Prep- I  Eating- I  Maintaining continence- I  Transferring/Ambulation- I  Managing Meds- I  Follow up appointments reviewed:  PCP Hospital f/u appt confirmed? Yes  Scheduled to see Debbrah Alar on 12/10/20 @ 10:40. [facility] f/u appt confirmed?  N/a   Are transportation arrangements needed? No  If their condition worsens, is the pt aware to call PCP or go to the Emergency Dept.? Yes Was the patient provided with contact information for the PCP's office or ED? Yes Was to pt encouraged to call back with questions or concerns? Yes

## 2020-12-10 ENCOUNTER — Inpatient Hospital Stay: Payer: Medicare Other | Admitting: Family

## 2020-12-10 NOTE — Progress Notes (Incomplete)
Subjective:   By signing my name below, I, [name], attest that this documentation has been prepared under the direction and in the presence of Debbrah Alar NP. 12/10/2020    Patient ID: [name], male    DOB: [birth year], 75 y.o.   MRN: [id number]  No chief complaint on file.   HPI Patient is in today for a hospital follow up.  He was admitted to the hospital on 11/24/2020 for weakness, Covid-19, and acute encephalopathy. He was discharged on 11/27/2020. He was recommended to follow up with his PCP 2 weeks after his discharge.     Health Maintenance Due  Topic Date Due   OPHTHALMOLOGY EXAM  10/16/2014   COVID-19 Vaccine (3 - Pfizer risk series) 06/29/2019   INFLUENZA VACCINE  10/15/2020   HEMOGLOBIN A1C  11/01/2020    Past Medical History:  Diagnosis Date   Acute encephalopathy 12/12/2015   Acute respiratory failure with hypoxia and hypercapnia (HCC) 12/12/2015   Allergic rhinitis 02/13/2010   Qualifier: Diagnosis of  By: Wynona Luna    Angina at rest [facility]) 10/22/2015   Ataxia 08/10/2013   Atrial fibrillation ([place]) 11/2018   B12 deficiency 08/11/2013   BACK PAIN, LUMBAR 01/29/2009   Qualifier: Diagnosis of  By: Wynona Luna    BENIGN POSITIONAL VERTIGO 11/23/2009   Qualifier: Diagnosis of  By: Wynona Luna    Chronic diastolic heart failure ([place]) 04/24/2015   Chronic pain    left sided-[place]   Coronary 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 ([place]) 11/04/2015   s/p CABG 2004; CFX DES 2011   Essential hypertension 12/12/2006   Qualifier: Diagnosis of  By: [name] RMA, Lucy     GERD 12/12/2006   Qualifier: Diagnosis of  By: Reatha Armour, Lucy     GOUT 12/12/2006   Qualifier: Diagnosis of  By: [name] RMA, Lucy     Hemorrhoids    Hyperlipidemia LDL goal <70 12/12/2006   Qualifier: Diagnosis of  By: [name] RMA, [place]      Hypogonadism male 04/10/2011   Hypothyroidism, acquired 02/15/2013   Insomnia 05/21/2013   Long term current use  of anticoagulant therapy 06/24/2011   Obstructive sleep apnea-Failed CPAP 06/17/2007   Failed CPAP Using O 2 for sleep    Paroxysmal atrial fibrillation ([place]) 12/06/2016   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   also 1996; resultant hemiplegia of dominant side   Thoracic aorta atherosclerosis ([place]) 04/27/2017   Tubular adenoma of colon 05/2010   Type 2 diabetes mellitus without complication, without long-term current use of insulin ([place]) 10/05/2007   Qualifier: Diagnosis of  By: Wynona Luna    Vasovagal syncope    Wears glasses     Past Surgical History:  Procedure Laterality Date   CARDIAC CATHETERIZATION N/A 10/25/2015   Procedure: Left Heart Cath and Cors/Grafts Angiography;  Surgeon: Troy Sine, MD;  Location: [place] CV LAB;  Service: Cardiovascular;  Laterality: N/A;   CARDIOVERSION  06/20/2011   Procedure: CARDIOVERSION;  Surgeon: Larey Dresser, MD;  Location[facility] ENDOSCOPY;  Service: Cardiovascular;  Laterality: N/A;   CARDIOVERSION N/A 04/26/2015   Procedure: CARDIOVERSION;  Surgeon: Dorothy Spark, MD;  Location: [facility] ENDOSCOPY;  Service: Cardiovascular;  Laterality: N/A;   CARDIOVERSION N/A 05/10/2015   Procedure: CARDIOVERSION;  Surgeon: Larey Dresser, MD;  Location: [place];  Service: Cardiovascular;  Laterality: N/A;   CARDIOVERSION N/A 12/10/2018   Procedure: CARDIOVERSION;  Surgeon: Croitoru,  Dani Gobble, MD;  Location: [place];  Service: Cardiovascular;  Laterality: N/A;   CATARACT EXTRACTION  05/2010   left eye   CATARACT EXTRACTION  04/2010   right eye   COLONOSCOPY WITH PROPOFOL N/A 02/22/2020   Procedure: COLONOSCOPY WITH PROPOFOL;  Surgeon: Lavena Bullion, DO;  Location: [facility] ENDOSCOPY;  Service: Gastroenterology;  Laterality: N/A;   CORONARY ARTERY BYPASS GRAFT     stent   ESOPHAGOGASTRODUODENOSCOPY  03-25-2005   LEFT HEART CATH AND CORS/GRAFTS ANGIOGRAPHY N/A 02/19/2018   Procedure: LEFT HEART CATH AND CORS/GRAFTS ANGIOGRAPHY;  Surgeon: Martinique, Peter M,  MD;  Location: [place] CV LAB;  Service: Cardiovascular;  Laterality: N/A;   NASAL SEPTUM SURGERY     PERCUTANEOUS PLACEMENT INTRAVASCULAR STENT CERVICAL CAROTID ARTERY     03-2009; using a drug-eluting platform of the circumflex cornoray artery with a 3.0 x 18 [employer] Promus drug-eluting platform post dilated to 3.75 with a noncompliant balloon.   POLYPECTOMY  02/22/2020   Procedure: POLYPECTOMY;  Surgeon: Lavena Bullion, DO;  Location: [facility] ENDOSCOPY;  Service: Gastroenterology;;   TEE WITHOUT CARDIOVERSION  06/20/2011   Procedure: TRANSESOPHAGEAL ECHOCARDIOGRAM (TEE);  Surgeon: Larey Dresser, MD;  Location: [facility] ENDOSCOPY;  Service: Cardiovascular;  Laterality: N/A;    Family History  Problem Relation Age of Onset   Lung cancer Father        deceased   Stroke Mother        deceased-MINISTROKES   Colon cancer Neg Hx    Esophageal cancer Neg Hx    Stomach cancer Neg Hx    Pancreatic cancer Neg Hx    Liver disease Neg Hx     Social History   Socioeconomic History   Marital status: Married    Spouse name: [name]   Number of children: Not on file   Years of education: 14   Highest education level: Not on file  Occupational History   Occupation: retired    Fish farm manager: OTHER    Comment: Mining engineer  Tobacco Use   Smoking status: Never   Smokeless tobacco: Never  Vaping Use   Vaping Use: Never used  Substance and Sexual Activity   Alcohol use: Not Currently   Drug use: No   Sexual activity: Not Currently  Other Topics Concern   Not on file  Social History Narrative   Pt lives with wife. Does have stairs, but patient doesn't use them. Pt has completed technical school   Social Determinants of Health   Financial Resource Strain: Not on file  Food Insecurity: Not on file  Transportation Needs: Not on file  Physical Activity: Not on file  Stress: Not on file  Social Connections: Not on file  Intimate Partner Violence: Not on file    Outpatient  Medications Prior to Visit  Medication Sig Dispense Refill   allopurinol (ZYLOPRIM) 100 MG tablet TAKE 2 TABLETS BY MOUTH EVERY DAY (Patient taking differently: Take 100 mg by mouth 2 (two) times daily.) 180 tablet 1   atorvastatin (LIPITOR) 40 MG tablet TAKE 1 TABLET BY MOUTH EVERY DAY (Patient taking differently: Take 40 mg by mouth daily.) 90 tablet 1   benazepril (LOTENSIN) 10 MG tablet TAKE 1 TABLET (10 MG TOTAL) BY MOUTH DAILY. TAKE WITH ADDITIONAL 5MG TABLET OF BENAZEPRIL DAILY 90 tablet 1   benazepril (LOTENSIN) 5 MG tablet TAKE 1 TABLET BY MOUTH DAILY WITH 10MG OF BENAZEPRIL TO EQUAL 15MG DAILY 90 tablet 1   blood glucose meter kit and supplies KIT  Dispense based on patient and insurance preference. Use up to four times daily as directed. (FOR ICD-9 250.00, 250.01). 102 each 0   calcium carbonate (TUMS - DOSED IN MG ELEMENTAL CALCIUM) 500 MG chewable tablet Chew 1 tablet by mouth daily as needed for indigestion or heartburn.     cyanocobalamin 1000 MCG tablet Take 2,000 mcg by mouth daily. Take 2 Gummies daily     diclofenac Sodium (VOLTAREN) 1 % GEL USE AS DIRECTED (Patient not taking: No sig reported) 100 g 6   diltiazem (CARDIZEM CD) 180 MG 24 hr capsule Take 1 capsule (180 mg total) by mouth daily. 90 capsule 1   dofetilide (TIKOSYN) 500 MCG capsule Take 1 capsule (500 mcg total) by mouth 2 (two) times daily. 180 capsule 1   ELIQUIS 5 MG TABS tablet TAKE 1 TABLET BY MOUTH TWICE A DAY 180 tablet 1   ferrous sulfate 325 (65 FE) MG tablet Take 1 tablet (325 mg total) by mouth 2 (two) times daily with a meal. (Patient taking differently: Take 650 mg by mouth every evening.) 180 tablet 1   fluticasone (FLONASE) 50 MCG/ACT nasal spray Place 2 sprays into both nostrils as needed for allergies or rhinitis. 16 g 0   folic acid (FOLVITE) 1 MG tablet TAKE 1 TABLET BY MOUTH EVERY DAY 90 tablet 0   furosemide (LASIX) 40 MG tablet Take 1 tablet (40 mg total) by mouth daily as needed for fluid or  edema. NEEDS OFFICE VISIT BEFORE ANY FURTHER REFILLS 30 tablet 0   gabapentin (NEURONTIN) 100 MG capsule Take 1 capsule (100 mg total) by mouth at bedtime. NEEDS OFFICE VISIT BEFORE ANY FURTHER REFILLS 30 capsule 0   glucose blood (ACCU-CHEK GUIDE) test strip USE AS DIRECTED UP TO 4 TIMES DAILY 100 strip 0   levothyroxine (SYNTHROID) 112 MCG tablet Take 1 tablet (112 mcg total) by mouth daily before breakfast. NEEDS OFFICE VISIT BEFORE ANY FURTHER REFILLS 90 tablet 0   metFORMIN (GLUCOPHAGE) 500 MG tablet TAKE 1 TABLET BY MOUTH EVERY DAY WITH BREAKFAST 30 tablet 0   metoprolol succinate (TOPROL-XL) 25 MG 24 hr tablet Take 1 tablet (25 mg total) by mouth daily. Please make overdue appt with Dr. Caryl Comes before anymore refills. Thank you 1st attempt 30 tablet 0   Multiple Vitamins-Minerals (MULTIVITAMIN GUMMIES MENS PO) Take 2 tablets by mouth daily with breakfast.      nitroGLYCERIN (NITROSTAT) 0.4 MG SL tablet Place 1 tablet (0.4 mg total) under the tongue every 5 (five) minutes as needed for chest pain. Up to 3 doses (Patient not taking: No sig reported) 10 tablet 2   OXYGEN Inhale 2 L into the lungs at bedtime.     pantoprazole (PROTONIX) 40 MG tablet Take 1 tablet (40 mg total) by mouth daily before breakfast. 90 tablet 3   sennosides-docusate sodium (SENOKOT-S) 8.6-50 MG tablet Take 1 tablet by mouth as needed for constipation.     traMADol (ULTRAM) 50 MG tablet TAKE 1 TABLET BY MOUTH TWICE A DAY 180 tablet 1   traMADol (ULTRAM-ER) 200 MG 24 hr tablet Take 1 tablet (200 mg total) by mouth daily. 90 tablet 0   No facility-administered medications prior to visit.    No Known Allergies  ROS     Objective:    Physical Exam Constitutional:      General: He is not in acute distress.    Appearance: Normal appearance. He is not ill-appearing.  HENT:     Head: Normocephalic and atraumatic.  Right Ear: External ear normal.     Left Ear: External ear normal.  Eyes:     Extraocular  Movements: Extraocular movements intact.     Pupils: Pupils are equal, round, and reactive to light.  Cardiovascular:     Rate and Rhythm: Normal rate and regular rhythm.     Heart sounds: Normal heart sounds. No murmur heard.   No gallop.  Pulmonary:     Effort: Pulmonary effort is normal. No respiratory distress.     Breath sounds: Normal breath sounds. No wheezing or rales.  Skin:    General: Skin is warm and dry.  Neurological:     Mental Status: He is alert and oriented to person, place, and time.  Psychiatric:        Behavior: Behavior normal.    There were no vitals taken for this visit. Wt Readings from Last 3 Encounters:  11/25/20 240 lb 4.8 oz (109 kg)  10/19/20 240 lb (108.9 kg)  09/04/20 239 lb (108.4 kg)       Assessment & Plan:   Problem List Items Addressed This Visit   None    No orders of the defined types were placed in this encounter.   I, Debbrah Alar NP, personally preformed the services described in this documentation.  All medical record entries made by the scribe were at my direction and in my presence.  I have reviewed the chart and discharge instructions (if applicable) and agree that the record reflects my personal performance and is accurate and complete. 12/10/2020   I,[name],acting as a scribe for [name], NP.,have documented all relevant documentation on the behalf of [name], NP,as directed by  [name], NP while in the presence of [name], NP.   [name] [employer]

## 2020-12-11 ENCOUNTER — Other Ambulatory Visit: Payer: Self-pay

## 2020-12-11 ENCOUNTER — Ambulatory Visit (INDEPENDENT_AMBULATORY_CARE_PROVIDER_SITE_OTHER): Payer: Medicare Other | Admitting: Family

## 2020-12-11 ENCOUNTER — Other Ambulatory Visit: Payer: Self-pay | Admitting: Family

## 2020-12-11 ENCOUNTER — Encounter: Payer: Self-pay | Admitting: Family

## 2020-12-11 VITALS — BP 122/66 | HR 62 | Temp 98.0°F | Resp 16 | Wt 244.0 lb

## 2020-12-11 DIAGNOSIS — D649 Anemia, unspecified: Secondary | ICD-10-CM | POA: Diagnosis not present

## 2020-12-11 DIAGNOSIS — E039 Hypothyroidism, unspecified: Secondary | ICD-10-CM

## 2020-12-11 DIAGNOSIS — I4891 Unspecified atrial fibrillation: Secondary | ICD-10-CM | POA: Diagnosis not present

## 2020-12-11 DIAGNOSIS — U071 COVID-19: Secondary | ICD-10-CM

## 2020-12-11 DIAGNOSIS — E119 Type 2 diabetes mellitus without complications: Secondary | ICD-10-CM

## 2020-12-11 DIAGNOSIS — I5032 Chronic diastolic (congestive) heart failure: Secondary | ICD-10-CM

## 2020-12-11 DIAGNOSIS — Z8639 Personal history of other endocrine, nutritional and metabolic disease: Secondary | ICD-10-CM

## 2020-12-11 DIAGNOSIS — Z23 Encounter for immunization: Secondary | ICD-10-CM | POA: Diagnosis not present

## 2020-12-11 LAB — CBC WITH DIFFERENTIAL/PLATELET
Basophils Absolute: 0.1 10*3/uL (ref 0.0–0.1)
Basophils Relative: 0.8 % (ref 0.0–3.0)
Eosinophils Absolute: 0.2 10*3/uL (ref 0.0–0.7)
Eosinophils Relative: 3.2 % (ref 0.0–5.0)
HCT: 38.7 % — ABNORMAL LOW (ref 39.0–52.0)
Hemoglobin: 12.9 g/dL — ABNORMAL LOW (ref 13.0–17.0)
Lymphocytes Relative: 31.7 % (ref 12.0–46.0)
Lymphs Abs: 2.1 10*3/uL (ref 0.7–4.0)
MCHC: 33.3 g/dL (ref 30.0–36.0)
MCV: 92.5 fl (ref 78.0–100.0)
Monocytes Absolute: 0.6 10*3/uL (ref 0.1–1.0)
Monocytes Relative: 8.5 % (ref 3.0–12.0)
Neutro Abs: 3.6 10*3/uL (ref 1.4–7.7)
Neutrophils Relative %: 55.8 % (ref 43.0–77.0)
Platelets: 148 10*3/uL — ABNORMAL LOW (ref 150.0–400.0)
RBC: 4.18 Mil/uL — ABNORMAL LOW (ref 4.22–5.81)
RDW: 14.4 % (ref 11.5–15.5)
WBC: 6.5 10*3/uL (ref 4.0–10.5)

## 2020-12-11 LAB — BASIC METABOLIC PANEL
BUN: 19 mg/dL (ref 6–23)
CO2: 29 mEq/L (ref 19–32)
Calcium: 9.6 mg/dL (ref 8.4–10.5)
Chloride: 101 mEq/L (ref 96–112)
Creatinine, Ser: 1.19 mg/dL (ref 0.40–1.50)
GFR: 58.89 mL/min — ABNORMAL LOW (ref >60.00)
Glucose, Bld: 103 mg/dL — ABNORMAL HIGH (ref 70–99)
Potassium: 4.4 mEq/L (ref 3.5–5.1)
Sodium: 140 mEq/L (ref 135–145)

## 2020-12-11 MED ORDER — FREESTYLE LIBRE 2 READER DEVI: 0 refills | Status: DC

## 2020-12-11 MED ORDER — FREESTYLE LIBRE 14 DAY SENSOR MISC: 5 refills | Status: DC

## 2020-12-11 NOTE — Assessment & Plan Note (Signed)
Clinically improved.  

## 2020-12-11 NOTE — Patient Instructions (Signed)
Please complete lab work prior to leaving.   

## 2020-12-11 NOTE — Assessment & Plan Note (Signed)
Lab Results  Component Value Date   WBC 10.3 11/27/2020   HGB 11.9 (L) 11/27/2020   HCT 36.4 (L) 11/27/2020   MCV 94.1 11/27/2020   PLT 141 (L) 11/27/2020

## 2020-12-11 NOTE — Telephone Encounter (Signed)
Could you please notify patient? He has the option to pay cash if he wants.  Tks

## 2020-12-11 NOTE — Assessment & Plan Note (Signed)
>>  ASSESSMENT AND PLAN FOR ATRIAL FIBRILLATION (HCC) WRITTEN ON 12/11/2020  1:24 PM BY O'SULLIVAN, Waniya Hoglund, NP  Rate stable on tikosyn  and diltiazem . Continues Eliquis . Management per cardiology.

## 2020-12-11 NOTE — Assessment & Plan Note (Signed)
Lab Results  Component Value Date   TSH 1.82 05/04/2020   Due for follow up TSH. Continue synthroid 112 mcg once daily.

## 2020-12-11 NOTE — Assessment & Plan Note (Signed)
Rate stable on tikosyn and diltiazem. Continues Eliquis. Management per cardiology.

## 2020-12-11 NOTE — Telephone Encounter (Signed)
Tried to run prior auth none of the CGM items are covered.

## 2020-12-11 NOTE — Assessment & Plan Note (Signed)
Wt Readings from Last 3 Encounters:  12/11/20 244 lb (110.7 kg)  11/25/20 240 lb 4.8 oz (109 kg)  10/19/20 240 lb (108.9 kg)   Clinically euvolemic on furosemide 40mg  prn- reports rare needed.

## 2020-12-11 NOTE — Assessment & Plan Note (Signed)
Lab Results  Component Value Date   HGBA1C 5.9 05/04/2020   HGBA1C 6.2 01/24/2020   HGBA1C 5.9 05/25/2019   Lab Results  Component Value Date   MICROALBUR <0.7 09/26/2014   LDLCALC 75 05/04/2020   CREATININE 1.18 11/27/2020

## 2020-12-11 NOTE — Progress Notes (Signed)
Subjective:   By signing my name below, I, Lyric Barr-McArthur, attest that this documentation has been prepared under the direction and in the presence of Debbrah Alar, NP, 12/11/2020   Patient ID: [name], male    DOB: Aug 26, 1945, 75 y.o.   MRN: [id number]  Chief Complaint  Patient presents with   Follow-up    Here for hospital follow up    HPI Patient is in today for a hospital follow-up.   ED visit: He went to the ED on 11/24/2020 with a fever, cough and generalized weakness. He was tested for Covid-19 in the ED and the results were positive. He is high risk for complications of [id number] so was monitored in the ED accordingly.  Prolonged side effects: He is still not experiencing his full energy levels. He claims that he feels like he only has 80% of his energy back after recovering from Covid-19.  Medications: He is compliant in taking his 5 mg Lotensin for his blood pressure and 500 mg Tikosyn for his heart rate. He is also taking his 5 mg Eliquis. He states he takes his 40 mg lasix very rarely because he has not experienced much water retention.  Blood sugar: He claims when he has taken his sugar levels at home they have ranged between 120 and 125. He notes that he does not drink anything with sugar in it and when her eats there is minimal sugar content so he hopes to get away from daily blood sugar monitoring.  Freestyle Libre: He is interested in being put on a freestyle libre due to the fact that he does not want to continue using the Fingerstick devices.  Immunizations: He is interested in receiving his flu vaccination today in the office. He is also interested in getting the updated Covid-19 booster vaccine at a later date.    Health Maintenance Due  Topic Date Due   OPHTHALMOLOGY EXAM  10/16/2014   COVID-19 Vaccine (3 - Pfizer risk series) 06/29/2019   INFLUENZA VACCINE  10/15/2020   HEMOGLOBIN A1C  11/01/2020    Past Medical History:  Diagnosis Date    Acute encephalopathy 12/12/2015   Acute respiratory failure with hypoxia and hypercapnia (HCC) 12/12/2015   Allergic rhinitis 02/13/2010   Qualifier: Diagnosis of  By: Wynona Luna    Angina at rest [facility]) 10/22/2015   Ataxia 08/10/2013   Atrial fibrillation ([place]) 11/2018   B12 deficiency 08/11/2013   BACK PAIN, LUMBAR 01/29/2009   Qualifier: Diagnosis of  By: Wynona Luna    BENIGN POSITIONAL VERTIGO 11/23/2009   Qualifier: Diagnosis of  By: Wynona Luna    Chronic diastolic heart failure ([place]) 04/24/2015   Chronic pain    left sided-[place]   Coronary 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 ([place]) 11/04/2015   s/p CABG 2004; CFX DES 2011   Essential hypertension 12/12/2006   Qualifier: Diagnosis of  By: [name] RMA, [place]     GERD 12/12/2006   Qualifier: Diagnosis of  By: Reatha Armour, Lucy     GOUT 12/12/2006   Qualifier: Diagnosis of  By: [name] RMA, [place]     Hemorrhoids    Hyperlipidemia LDL goal <70 12/12/2006   Qualifier: Diagnosis of  By: [name] RMA, [place]      Hypogonadism male 04/10/2011   Hypothyroidism, acquired 02/15/2013   Insomnia 05/21/2013   Long term current use of anticoagulant therapy 06/24/2011   Obstructive sleep apnea-Failed CPAP 06/17/2007  Failed CPAP Using O 2 for sleep    Paroxysmal atrial fibrillation ([place]) 12/06/2016   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/13/2017   also 1996; resultant hemiplegia of dominant side   Thoracic aorta atherosclerosis ([place]) 04/27/2017   Tubular adenoma of colon 05/2010   Type 2 diabetes mellitus without complication, without long-term current use of insulin ([place]) 10/05/2007   Qualifier: Diagnosis of  By: Wynona Luna    Vasovagal syncope    Wears glasses     Past Surgical History:  Procedure Laterality Date   CARDIAC CATHETERIZATION N/A 10/25/2015   Procedure: Left Heart Cath and Cors/Grafts Angiography;  Surgeon: Troy Sine, MD;  Location: [place] [facility];  Service: Cardiovascular;  Laterality: N/A;    CARDIOVERSION  06/20/2011   Procedure: CARDIOVERSION;  Surgeon: Larey Dresser, MD;  Location: [place];  Service: Cardiovascular;  Laterality: N/A;   CARDIOVERSION N/A 04/26/2015   Procedure: CARDIOVERSION;  Surgeon: Dorothy Spark, MD;  Location: [facility] ENDOSCOPY;  Service: Cardiovascular;  Laterality: N/A;   CARDIOVERSION N/A 05/10/2015   Procedure: CARDIOVERSION;  Surgeon: Larey Dresser, MD;  Location: [place];  Service: Cardiovascular;  Laterality: N/A;   CARDIOVERSION N/A 12/10/2018   Procedure: CARDIOVERSION;  Surgeon: Sanda Klein, MD;  Location: [place];  Service: Cardiovascular;  Laterality: N/A;   CATARACT EXTRACTION  05/2010   left eye   CATARACT EXTRACTION  04/2010   right eye   COLONOSCOPY WITH PROPOFOL N/A 02/22/2020   Procedure: COLONOSCOPY WITH PROPOFOL;  Surgeon: Lavena Bullion, DO;  Location: [facility] ENDOSCOPY;  Service: Gastroenterology;  Laterality: N/A;   CORONARY ARTERY BYPASS GRAFT     stent   ESOPHAGOGASTRODUODENOSCOPY  03-25-2005   LEFT HEART CATH AND CORS/GRAFTS ANGIOGRAPHY N/A 02/19/2018   Procedure: LEFT HEART CATH AND CORS/GRAFTS ANGIOGRAPHY;  Surgeon: Martinique, Peter M, MD;  Location[facility];  Service: Cardiovascular;  Laterality: N/A;   NASAL SEPTUM SURGERY     PERCUTANEOUS PLACEMENT INTRAVASCULAR STENT CERVICAL CAROTID ARTERY     03-2009; using a drug-eluting platform of the circumflex cornoray artery with a 3.0 x 18 [employer] Promus drug-eluting platform post dilated to 3.75 with a noncompliant balloon.   POLYPECTOMY  02/22/2020   Procedure: POLYPECTOMY;  Surgeon: Lavena Bullion, DO;  Location: [facility] ENDOSCOPY;  Service: Gastroenterology;;   TEE WITHOUT CARDIOVERSION  06/20/2011   Procedure: TRANSESOPHAGEAL ECHOCARDIOGRAM (TEE);  Surgeon: Larey Dresser, MD;  Location: [facility] ENDOSCOPY;  Service: Cardiovascular;  Laterality: N/A;    Family History  Problem Relation Age of Onset   Lung cancer Father        deceased   Stroke Mother         deceased-MINISTROKES   Colon cancer Neg Hx    Esophageal cancer Neg Hx    Stomach cancer Neg Hx    Pancreatic cancer Neg Hx    Liver disease Neg Hx     Social History   Socioeconomic History   Marital status: Married    Spouse name: [name]   Number of children: Not on file   Years of education: 14   Highest education level: Not on file  Occupational History   Occupation: retired    Fish farm manager: OTHER    Comment: Mining engineer  Tobacco Use   Smoking status: Never   Smokeless tobacco: Never  Vaping Use   Vaping Use: Never used  Substance and Sexual Activity   Alcohol use: Not Currently   Drug use: No   Sexual activity:  Not Currently  Other Topics Concern   Not on file  Social History Narrative   Pt lives with wife. Does have stairs, but patient doesn't use them. Pt has completed technical school   Social Determinants of Health   Financial Resource Strain: Not on file  Food Insecurity: Not on file  Transportation Needs: Not on file  Physical Activity: Not on file  Stress: Not on file  Social Connections: Not on file  Intimate Partner Violence: Not on file    Outpatient Medications Prior to Visit  Medication Sig Dispense Refill   allopurinol (ZYLOPRIM) 100 MG tablet TAKE 2 TABLETS BY MOUTH EVERY DAY (Patient taking differently: Take 100 mg by mouth 2 (two) times daily.) 180 tablet 1   atorvastatin (LIPITOR) 40 MG tablet TAKE 1 TABLET BY MOUTH EVERY DAY (Patient taking differently: Take 40 mg by mouth daily.) 90 tablet 1   benazepril (LOTENSIN) 10 MG tablet TAKE 1 TABLET (10 MG TOTAL) BY MOUTH DAILY. TAKE WITH ADDITIONAL 5MG TABLET OF BENAZEPRIL DAILY 90 tablet 1   benazepril (LOTENSIN) 5 MG tablet TAKE 1 TABLET BY MOUTH DAILY WITH 10MG OF BENAZEPRIL TO EQUAL 15MG DAILY 90 tablet 1   blood glucose meter kit and supplies KIT Dispense based on patient and insurance preference. Use up to four times daily as directed. (FOR ICD-9 250.00, 250.01). 102 each 0   calcium  carbonate (TUMS - DOSED IN MG ELEMENTAL CALCIUM) 500 MG chewable tablet Chew 1 tablet by mouth daily as needed for indigestion or heartburn.     cyanocobalamin 1000 MCG tablet Take 2,000 mcg by mouth daily. Take 2 Gummies daily     diclofenac Sodium (VOLTAREN) 1 % GEL USE AS DIRECTED 100 g 6   diltiazem (CARDIZEM CD) 180 MG 24 hr capsule Take 1 capsule (180 mg total) by mouth daily. 90 capsule 1   dofetilide (TIKOSYN) 500 MCG capsule Take 1 capsule (500 mcg total) by mouth 2 (two) times daily. 180 capsule 1   ELIQUIS 5 MG TABS tablet TAKE 1 TABLET BY MOUTH TWICE A DAY 180 tablet 1   ferrous sulfate 325 (65 FE) MG tablet Take 1 tablet (325 mg total) by mouth 2 (two) times daily with a meal. (Patient taking differently: Take 650 mg by mouth every evening.) 180 tablet 1   fluticasone (FLONASE) 50 MCG/ACT nasal spray Place 2 sprays into both nostrils as needed for allergies or rhinitis. 16 g 0   folic acid (FOLVITE) 1 MG tablet TAKE 1 TABLET BY MOUTH EVERY DAY 90 tablet 0   furosemide (LASIX) 40 MG tablet Take 1 tablet (40 mg total) by mouth daily as needed for fluid or edema. NEEDS OFFICE VISIT BEFORE ANY FURTHER REFILLS 30 tablet 0   gabapentin (NEURONTIN) 100 MG capsule Take 1 capsule (100 mg total) by mouth at bedtime. NEEDS OFFICE VISIT BEFORE ANY FURTHER REFILLS 30 capsule 0   glucose blood (ACCU-CHEK GUIDE) test strip USE AS DIRECTED UP TO 4 TIMES DAILY 100 strip 0   levothyroxine (SYNTHROID) 112 MCG tablet Take 1 tablet (112 mcg total) by mouth daily before breakfast. NEEDS OFFICE VISIT BEFORE ANY FURTHER REFILLS 90 tablet 0   metFORMIN (GLUCOPHAGE) 500 MG tablet TAKE 1 TABLET BY MOUTH EVERY DAY WITH BREAKFAST 30 tablet 0   metoprolol succinate (TOPROL-XL) 25 MG 24 hr tablet Take 1 tablet (25 mg total) by mouth daily. Please make overdue appt with Dr. Caryl Comes before anymore refills. Thank you 1st attempt 30 tablet 0   Multiple  Vitamins-Minerals (MULTIVITAMIN GUMMIES MENS PO) Take 2 tablets by mouth  daily with breakfast.      nitroGLYCERIN (NITROSTAT) 0.4 MG SL tablet Place 1 tablet (0.4 mg total) under the tongue every 5 (five) minutes as needed for chest pain. Up to 3 doses 10 tablet 2   OXYGEN Inhale 2 L into the lungs at bedtime.     pantoprazole (PROTONIX) 40 MG tablet Take 1 tablet (40 mg total) by mouth daily before breakfast. 90 tablet 3   sennosides-docusate sodium (SENOKOT-S) 8.6-50 MG tablet Take 1 tablet by mouth as needed for constipation.     traMADol (ULTRAM) 50 MG tablet TAKE 1 TABLET BY MOUTH TWICE A DAY 180 tablet 1   traMADol (ULTRAM-ER) 200 MG 24 hr tablet Take 1 tablet (200 mg total) by mouth daily. 90 tablet 0   No facility-administered medications prior to visit.    No Known Allergies  Review of Systems  Constitutional:  Positive for malaise/fatigue (Prolonged side effect from contracting Covid-19).      Objective:    Physical Exam Constitutional:      General: He is not in acute distress.    Appearance: Normal appearance. He is not ill-appearing.  HENT:     Head: Normocephalic and atraumatic.     Right Ear: External ear normal.     Left Ear: External ear normal.  Eyes:     Extraocular Movements: Extraocular movements intact.     Pupils: Pupils are equal, round, and reactive to light.  Cardiovascular:     Rate and Rhythm: Normal rate and regular rhythm.     Heart sounds: Normal heart sounds. No murmur heard.   No gallop.  Pulmonary:     Effort: Pulmonary effort is normal. No respiratory distress.     Breath sounds: Normal breath sounds. No wheezing or rales.  Lymphadenopathy:     Cervical: No cervical adenopathy.  Skin:    General: Skin is warm and dry.  Neurological:     Mental Status: He is alert and oriented to person, place, and time.  Psychiatric:        Behavior: Behavior normal.        Judgment: Judgment normal.    BP 122/66 (BP Location: Right Arm, Patient Position: Sitting, Cuff Size: Small)   Pulse 62   Temp 98 F (36.7 C)  (Oral)   Resp 16   Wt 244 lb (110.7 kg)   SpO2 99%   BMI 30.50 kg/m  Wt Readings from Last 3 Encounters:  12/11/20 244 lb (110.7 kg)  11/25/20 240 lb 4.8 oz (109 kg)  10/19/20 240 lb (108.9 kg)       Assessment & Plan:   Problem List Items Addressed This Visit       Unprioritized   Type 2 diabetes mellitus without complication, without long-term current use of insulin (HCC)   Relevant Medications   Continuous Blood Gluc Sensor (FREESTYLE LIBRE 14 DAY SENSOR) MISC   Continuous Blood Gluc Receiver (FREESTYLE LIBRE 2 READER) DEVI   Normocytic anemia    Lab Results  Component Value Date   WBC 10.3 11/27/2020   HGB 11.9 (L) 11/27/2020   HCT 36.4 (L) 11/27/2020   MCV 94.1 11/27/2020   PLT 141 (L) 11/27/2020        Hypothyroidism    Lab Results  Component Value Date   TSH 1.82 05/04/2020  Due for follow up TSH. Continue synthroid 112 mcg once daily.       History of  type 2 diabetes mellitus    Lab Results  Component Value Date   HGBA1C 5.9 05/04/2020   HGBA1C 6.2 01/24/2020   HGBA1C 5.9 05/25/2019   Lab Results  Component Value Date   MICROALBUR <0.7 09/26/2014   LDLCALC 75 05/04/2020   CREATININE 1.18 11/27/2020        Chronic diastolic heart failure (HCC)    Wt Readings from Last 3 Encounters:  12/11/20 244 lb (110.7 kg)  11/25/20 240 lb 4.8 oz (109 kg)  10/19/20 240 lb (108.9 kg)  Clinically euvolemic on furosemide 21m prn- reports rare needed.        Atrial fibrillation (HCC)    Rate stable on tikosyn and diltiazem. Continues Eliquis. Management per cardiology.       2019 novel coronavirus disease (COVID-19)    Clinically improved.        Other Visit Diagnoses     Needs flu shot    -  Primary   Relevant Orders   Flu Vaccine QUAD High Dose(Fluad)   COVID-19 virus infection       Relevant Orders   Basic metabolic panel   CBC with Differential/Platelet      Meds ordered this encounter  Medications   Continuous Blood Gluc Sensor  (FREESTYLE LIBRE 14 DAY SENSOR) MISC    Sig: Use as directed    Dispense:  2 each    Refill:  5    Order Specific Question:   Supervising Provider    Answer:   BPenni HomansA [[id number]]   Continuous Blood Gluc Receiver (FREESTYLE LIBRE 2 READER) DEVI    Sig: Use as directed    Dispense:  1 each    Refill:  0    Order Specific Question:   Supervising Provider    Answer:   BMosie Lukes[[id number]]    I, MDebbrah Alar NP, personally preformed the services described in this documentation.  All medical record entries made by the scribe were at my direction and in my presence.  I have reviewed the chart and discharge instructions (if applicable) and agree that the record reflects my personal performance and is accurate and complete. 12/11/2020  I,Lyric Barr-McArthur,acting as a scribe for MNance Pear NP.,have documented all relevant documentation on the behalf of MNance Pear NP,as directed by  MNance Pear NP while in the presence of MNance Pear NP.  MNance Pear NP

## 2020-12-12 NOTE — Telephone Encounter (Signed)
Called patient but no answer.

## 2020-12-21 NOTE — Telephone Encounter (Signed)
Patient advised divide is not covered. He will check with the pharmacy for cash price.

## 2020-12-24 ENCOUNTER — Encounter (HOSPITAL_COMMUNITY): Payer: Self-pay | Admitting: Emergency Medicine

## 2020-12-24 ENCOUNTER — Other Ambulatory Visit: Payer: Self-pay

## 2020-12-24 ENCOUNTER — Ambulatory Visit (HOSPITAL_COMMUNITY)
Admission: EM | Admit: 2020-12-24 | Discharge: 2020-12-24 | Disposition: A | Discharge status: Home / Self Care | Payer: Medicare Other | Attending: Internal Medicine | Admitting: Internal Medicine

## 2020-12-24 DIAGNOSIS — J019 Acute sinusitis, unspecified: Secondary | ICD-10-CM

## 2020-12-24 MED ORDER — FLUTICASONE PROPIONATE 50 MCG/ACT NA SUSP: 1.0000 | Freq: Every day | NASAL | 0 refills | Status: DC

## 2020-12-24 MED ORDER — GUAIFENESIN ER 600 MG PO TB12: 600.0000 mg | ORAL_TABLET | Freq: Two times a day (BID) | ORAL | 0 refills | Status: AC

## 2020-12-24 MED ORDER — DOXYCYCLINE HYCLATE 100 MG PO CAPS: 100.0000 mg | ORAL_CAPSULE | Freq: Two times a day (BID) | ORAL | 0 refills | Status: AC

## 2020-12-24 NOTE — ED Provider Notes (Signed)
[place]    CSN: [id number] Arrival date & time: 12/24/20  9326      History   Chief Complaint Chief Complaint  Patient presents with   Cough    HPI [name] is a 75 y.o. male comes to the urgent care with a 2-week history of nasal congestion, runny nose and a cough.  Patient denies any fever or chills.  Over the past couple of days to a few days he noticed greenish nasal discharge with postnasal drip.  He is coughing up some greenish phlegm.  No shortness of breath or wheezing.  No chest pain or chest pressure.  No nausea, vomiting or diarrhea.  No sick contacts.   HPI  Past Medical History:  Diagnosis Date   Acute encephalopathy 12/12/2015   Acute respiratory failure with hypoxia and hypercapnia (HCC) 12/12/2015   Allergic rhinitis 02/13/2010   Qualifier: Diagnosis of  By: Wynona Luna    Angina at rest [facility]) 10/22/2015   Ataxia 08/10/2013   Atrial fibrillation ([place]) 11/2018   B12 deficiency 08/11/2013   BACK PAIN, LUMBAR 01/29/2009   Qualifier: Diagnosis of  By: Wynona Luna    BENIGN POSITIONAL VERTIGO 11/23/2009   Qualifier: Diagnosis of  By: Wynona Luna    Chronic diastolic heart failure ([place]) 04/24/2015   Chronic pain    left sided-[place]   Coronary 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 ([place]) 11/04/2015   s/p CABG 2004; CFX DES 2011   Essential hypertension 12/12/2006   Qualifier: Diagnosis of  By: [name] RMA, Lucy     GERD 12/12/2006   Qualifier: Diagnosis of  By: [name] RMA, Lucy     GOUT 12/12/2006   Qualifier: Diagnosis of  By: [name] RMA, Lucy     Hemorrhoids    Hyperlipidemia LDL goal <70 12/12/2006   Qualifier: Diagnosis of  By: [name] RMA, [place]      Hypogonadism male 04/10/2011   Hypothyroidism, acquired 02/15/2013   Insomnia 05/21/2013   Long term current use of anticoagulant therapy 06/24/2011   Obstructive sleep apnea-Failed CPAP 06/17/2007   Failed CPAP Using O 2 for sleep    Paroxysmal atrial fibrillation  ([place]) 12/06/2016   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   also 1996; resultant hemiplegia of dominant side   Thoracic aorta atherosclerosis ([place]) 04/27/2017   Tubular adenoma of colon 05/2010   Type 2 diabetes mellitus without complication, without long-term current use of insulin ([place]) 10/05/2007   Qualifier: Diagnosis of  By: Wynona Luna    Vasovagal syncope    Wears glasses     Patient Active Problem List   Diagnosis Date Noted   2019 novel coronavirus disease (COVID-19) 11/25/2020   Class 1 obesity 11/25/2020   Normocytic anemia 11/25/2020   Macular pucker, right eye 08/06/2020   Hx of adenomatous colonic polyps    Adenomatous polyp of ascending colon    Skin tag of anus    Rectal varices    Primary osteoarthritis of first carpometacarpal joint of right hand 08/09/2019   Secondary hypercoagulable state ([place]) 08/02/2019   External hemorrhoid 05/19/2019   Chest wall hematoma 12/16/2018   Delirium 12/16/2018   Blood loss anemia 12/16/2018   Dysuria 12/16/2018   Acute encephalopathy 12/16/2018   Acute on chronic diastolic heart failure (HCC)    Anticoagulated on warfarin    Elevated troponin    Congestive heart failure (HCC)    Abnormal INR  AKI (acute kidney injury) ([place])    History of gout    History of hypothyroidism    History of type 2 diabetes mellitus    Atrial fibrillation ([place]) 11/27/2018   Ischemic chest pain ([place]) 02/17/2018   Pure hypercholesterolemia 05/01/2017   Thoracic aorta atherosclerosis ([place]) 04/27/2017   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   RUQ abdominal pain 01/13/2017   Paroxysmal atrial fibrillation ([place]) 12/06/2016   Dyspnea on exertion 10/21/2016   Acute respiratory failure with hypoxia and hypercapnia ([place]) 12/12/2015   Coronary 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 ([place]) 11/04/2015   CAD S/P CFX DES 2011 04/25/2015   Longstanding persistent atrial fibrillation ([place]) 04/25/2015   Vasovagal syncope    Atypical chest  pain 04/24/2015   Constipation 04/24/2015   Chronic diastolic heart failure ([place]) 04/24/2015   B12 deficiency 08/11/2013   Sensory disturbance 08/11/2013   Ataxia 08/10/2013   Laceration of ear, external, right 07/05/2013   Insomnia 05/21/2013   Weakness 11/17/2012   Spastic hemiplegia affecting dominant side ([place]) 07/21/2011   Long term current use of anticoagulant therapy 06/24/2011   Hypogonadism male 04/10/2011   Fatigue 03/31/2011   Allergic rhinitis 02/13/2010   BENIGN POSITIONAL VERTIGO 11/23/2009   Hx of CABG '04 05/04/2009   Hypothyroidism 04/02/2009   Back pain 01/29/2009   Type 2 diabetes mellitus without complication, without long-term current use of insulin ([place]) 10/05/2007   Obstructive sleep apnea-Failed CPAP 06/17/2007   Hyperlipidemia LDL goal <70 12/12/2006   GOUT 12/12/2006   Essential hypertension 12/12/2006   History of stroke with residual deficit 12/12/2006   GERD 12/12/2006    Past Surgical History:  Procedure Laterality Date   CARDIAC CATHETERIZATION N/A 10/25/2015   Procedure: Left Heart Cath and Cors/Grafts Angiography;  Surgeon: Troy Sine, MD;  Location: [place] [facility];  Service: Cardiovascular;  Laterality: N/A;   CARDIOVERSION  06/20/2011   Procedure: CARDIOVERSION;  Surgeon: Larey Dresser, MD;  Location: [place];  Service: Cardiovascular;  Laterality: N/A;   CARDIOVERSION N/A 04/26/2015   Procedure: CARDIOVERSION;  Surgeon: Dorothy Spark, MD;  Location: [facility] ENDOSCOPY;  Service: Cardiovascular;  Laterality: N/A;   CARDIOVERSION N/A 05/10/2015   Procedure: CARDIOVERSION;  Surgeon: Larey Dresser, MD;  Location: [place];  Service: Cardiovascular;  Laterality: N/A;   CARDIOVERSION N/A 12/10/2018   Procedure: CARDIOVERSION;  Surgeon: Sanda Klein, MD;  Location: [place];  Service: Cardiovascular;  Laterality: N/A;   CATARACT EXTRACTION  05/2010   left eye   CATARACT EXTRACTION  04/2010   right eye   COLONOSCOPY WITH PROPOFOL  N/A 02/22/2020   Procedure: COLONOSCOPY WITH PROPOFOL;  Surgeon: Lavena Bullion, DO;  Location: [facility] ENDOSCOPY;  Service: Gastroenterology;  Laterality: N/A;   CORONARY ARTERY BYPASS GRAFT     stent   ESOPHAGOGASTRODUODENOSCOPY  03-25-2005   LEFT HEART CATH AND CORS/GRAFTS ANGIOGRAPHY N/A 02/19/2018   Procedure: LEFT HEART CATH AND CORS/GRAFTS ANGIOGRAPHY;  Surgeon: Martinique, Peter M, MD;  Location: [place] [facility];  Service: Cardiovascular;  Laterality: N/A;   NASAL SEPTUM SURGERY     PERCUTANEOUS PLACEMENT INTRAVASCULAR STENT CERVICAL CAROTID ARTERY     03-2009; using a drug-eluting platform of the circumflex cornoray artery with a 3.0 x 18 [employer] Promus drug-eluting platform post dilated to 3.75 with a noncompliant balloon.   POLYPECTOMY  02/22/2020   Procedure: POLYPECTOMY;  Surgeon: Lavena Bullion, DO;  Location: [facility] ENDOSCOPY;  Service: Gastroenterology;;   TEE WITHOUT CARDIOVERSION  06/20/2011   Procedure: TRANSESOPHAGEAL ECHOCARDIOGRAM (TEE);  Surgeon: Larey Dresser, MD;  Location: [place];  Service: Cardiovascular;  Laterality: N/A;       Home Medications    Prior to Admission medications   Medication Sig Start Date End Date Taking? Authorizing Provider  doxycycline (VIBRAMYCIN) 100 MG capsule Take 1 capsule (100 mg total) by mouth 2 (two) times daily for 7 days. 12/24/20 12/31/20 Yes Lamptey, Myrene Galas, MD  fluticasone (FLONASE) 50 MCG/ACT nasal spray Place 1 spray into both nostrils daily. 12/24/20  Yes Lamptey, Myrene Galas, MD  guaiFENesin (MUCINEX) 600 MG 12 hr tablet Take 1 tablet (600 mg total) by mouth 2 (two) times daily for 10 days. 12/24/20 01/03/21 Yes Lamptey, Myrene Galas, MD  allopurinol (ZYLOPRIM) 100 MG tablet TAKE 2 TABLETS BY MOUTH EVERY DAY Patient taking differently: Take 100 mg by mouth 2 (two) times daily. 11/05/20   Debbrah Alar, NP  atorvastatin (LIPITOR) 40 MG tablet TAKE 1 TABLET BY MOUTH EVERY DAY Patient taking differently: Take 40 mg by  mouth daily. 09/30/20   Debbrah Alar, NP  benazepril (LOTENSIN) 10 MG tablet TAKE 1 TABLET (10 MG TOTAL) BY MOUTH DAILY. TAKE WITH ADDITIONAL 5MG TABLET OF BENAZEPRIL DAILY 09/30/20   Debbrah Alar, NP  benazepril (LOTENSIN) 5 MG tablet TAKE 1 TABLET BY MOUTH DAILY WITH 10MG OF BENAZEPRIL TO EQUAL 15MG DAILY 09/30/20   Debbrah Alar, NP  blood glucose meter kit and supplies KIT Dispense based on patient and insurance preference. Use up to four times daily as directed. (FOR ICD-9 250.00, 250.01). 01/26/19   Isaiah Serge, NP  calcium carbonate (TUMS - DOSED IN MG ELEMENTAL CALCIUM) 500 MG chewable tablet Chew 1 tablet by mouth daily as needed for indigestion or heartburn.    [provider]  Continuous Blood Gluc Receiver (FREESTYLE LIBRE 2 READER) DEVI Use as directed 12/11/20   Debbrah Alar, NP  Continuous Blood Gluc Sensor (FREESTYLE LIBRE 14 DAY SENSOR) MISC USE AS DIRECTED 12/21/20   Debbrah Alar, NP  cyanocobalamin 1000 MCG tablet Take 2,000 mcg by mouth daily. Take 2 Gummies daily    [provider]  diclofenac Sodium (VOLTAREN) 1 % GEL USE AS DIRECTED 08/29/19   Leandrew Koyanagi, MD  diltiazem (CARDIZEM CD) 180 MG 24 hr capsule Take 1 capsule (180 mg total) by mouth daily. 11/29/20   Baldwin Jamaica, PA-C  dofetilide (TIKOSYN) 500 MCG capsule Take 1 capsule (500 mcg total) by mouth 2 (two) times daily. 11/29/20   Baldwin Jamaica, PA-C  ELIQUIS 5 MG TABS tablet TAKE 1 TABLET BY MOUTH TWICE A DAY 11/29/20   Jerline Pain, MD  ferrous sulfate 325 (65 FE) MG tablet Take 1 tablet (325 mg total) by mouth 2 (two) times daily with a meal. Patient taking differently: Take 650 mg by mouth every evening. 05/25/19   Debbrah Alar, NP  folic acid (FOLVITE) 1 MG tablet TAKE 1 TABLET BY MOUTH EVERY DAY 11/29/20   Debbrah Alar, NP  furosemide (LASIX) 40 MG tablet Take 1 tablet (40 mg total) by mouth daily as needed for fluid or edema. NEEDS OFFICE VISIT  BEFORE ANY FURTHER REFILLS 11/29/20   Debbrah Alar, NP  gabapentin (NEURONTIN) 100 MG capsule Take 1 capsule (100 mg total) by mouth at bedtime. NEEDS OFFICE VISIT BEFORE ANY FURTHER REFILLS 11/29/20   Debbrah Alar, NP  glucose blood (ACCU-CHEK GUIDE) test strip USE AS DIRECTED UP TO 4 TIMES DAILY 03/21/19   Debbrah Alar,  NP  levothyroxine (SYNTHROID) 112 MCG tablet Take 1 tablet (112 mcg total) by mouth daily before breakfast. NEEDS OFFICE VISIT BEFORE ANY FURTHER REFILLS 11/29/20   Debbrah Alar, NP  metFORMIN (GLUCOPHAGE) 500 MG tablet TAKE 1 TABLET BY MOUTH EVERY DAY WITH BREAKFAST 11/29/20   Debbrah Alar, NP  metoprolol succinate (TOPROL-XL) 25 MG 24 hr tablet Take 1 tablet (25 mg total) by mouth daily. Please make overdue appt with Dr. Caryl Comes before anymore refills. Thank you 1st attempt 11/14/20   Deboraha Sprang, MD  Multiple Vitamins-Minerals (MULTIVITAMIN GUMMIES MENS PO) Take 2 tablets by mouth daily with breakfast.     [provider]  nitroGLYCERIN (NITROSTAT) 0.4 MG SL tablet Place 1 tablet (0.4 mg total) under the tongue every 5 (five) minutes as needed for chest pain. Up to 3 doses 03/05/15   Debbrah Alar, NP  OXYGEN Inhale 2 L into the lungs at bedtime.    [provider]  pantoprazole (PROTONIX) 40 MG tablet Take 1 tablet (40 mg total) by mouth daily before breakfast. 05/23/20   Debbrah Alar, NP  sennosides-docusate sodium (SENOKOT-S) 8.6-50 MG tablet Take 1 tablet by mouth as needed for constipation.    [provider]  traMADol (ULTRAM) 50 MG tablet TAKE 1 TABLET BY MOUTH TWICE A DAY 09/13/20   Kirsteins, Luanna Salk, MD  traMADol (ULTRAM-ER) 200 MG 24 hr tablet Take 1 tablet (200 mg total) by mouth daily. 09/04/20   Kirsteins, Luanna Salk, MD    Family History Family History  Problem Relation Age of Onset   Lung cancer Father        deceased   Stroke Mother        deceased-MINISTROKES   Colon cancer Neg Hx     Esophageal cancer Neg Hx    Stomach cancer Neg Hx    Pancreatic cancer Neg Hx    Liver disease Neg Hx     Social History Social History   Tobacco Use   Smoking status: Never   Smokeless tobacco: Never  Vaping Use   Vaping Use: Never used  Substance Use Topics   Alcohol use: Not Currently   Drug use: No     Allergies   Patient has no known allergies.   Review of Systems Review of Systems  HENT:  Positive for congestion, postnasal drip, sinus pressure and sore throat. Negative for dental problem, ear pain, facial swelling and voice change.   Respiratory:  Positive for cough.   Gastrointestinal: Negative.   Neurological: Negative.     Physical Exam Triage Vital Signs ED Triage Vitals  Enc Vitals Group     BP 12/24/20 1102 123/76     Pulse Rate 12/24/20 1102 76     Resp 12/24/20 1102 17     Temp 12/24/20 1102 98.4 F (36.9 C)     Temp Source 12/24/20 1102 Oral     SpO2 12/24/20 1102 94 %     Weight --      Height --      Head Circumference --      Peak Flow --      Pain Score 12/24/20 1100 0     Pain Loc --      Pain Edu? --      Excl. in [place]? --    No data found.  Updated Vital Signs BP 123/76   Pulse 76   Temp 98.4 F (36.9 C) (Oral)   Resp 17   SpO2 94%   Visual  Acuity Right Eye Distance:   Left Eye Distance:   Bilateral Distance:    Right Eye Near:   Left Eye Near:    Bilateral Near:     Physical Exam Vitals and nursing note reviewed.  Constitutional:      General: He is not in acute distress.    Appearance: Normal appearance. He is not ill-appearing.  HENT:     Right Ear: Tympanic membrane normal.     Left Ear: Tympanic membrane normal.     Nose: Congestion present.  Cardiovascular:     Rate and Rhythm: Normal rate and regular rhythm.     Pulses: Normal pulses.     Heart sounds: Normal heart sounds.  Pulmonary:     Effort: Pulmonary effort is normal.     Breath sounds: Normal breath sounds.  Abdominal:     General: Bowel sounds  are normal.     Palpations: Abdomen is soft.  Neurological:     Mental Status: He is alert.     UC Treatments / Results  Labs (all labs ordered are listed, but only abnormal results are displayed) Labs Reviewed - No data to display  EKG   Radiology No results found.  Procedures Procedures (including critical care time)  Medications Ordered in UC Medications - No data to display  Initial Impression / Assessment and Plan / UC Course  I have reviewed the triage vital signs and the nursing notes.  Pertinent labs & imaging results that were available during my care of the patient were reviewed by me and considered in my medical decision making (see chart for details).     1.  Acute sinusitis with symptoms greater than 10 days: Doxycycline twice daily for 7 days Fluticasone nasal spray Mucinex twice daily Humidifier use recommended Return to urgent care if symptoms worsen. Final Clinical Impressions(s) / UC Diagnoses   Final diagnoses:  Acute sinusitis with symptoms > 10 days     Discharge Instructions      Please use medications as prescribed Saline nasal spray will help with nasal congestion/discharge Humidifier use will help with nasal congestion and discharge If you have worsening symptoms please return to urgent care   ED Prescriptions     Medication Sig Dispense Auth. Provider   doxycycline (VIBRAMYCIN) 100 MG capsule Take 1 capsule (100 mg total) by mouth 2 (two) times daily for 7 days. 14 capsule Lamptey, Myrene Galas, MD   fluticasone (FLONASE) 50 MCG/ACT nasal spray Place 1 spray into both nostrils daily. 16 g Chase Picket, MD   guaiFENesin (MUCINEX) 600 MG 12 hr tablet Take 1 tablet (600 mg total) by mouth 2 (two) times daily for 10 days. 20 tablet Lamptey, Myrene Galas, MD      PDMP not reviewed this encounter.   Chase Picket, MD 12/24/20 [id number]

## 2020-12-24 NOTE — Discharge Instructions (Addendum)
Please use medications as prescribed Saline nasal spray will help with nasal congestion/discharge Humidifier use will help with nasal congestion and discharge If you have worsening symptoms please return to urgent care

## 2020-12-24 NOTE — ED Triage Notes (Signed)
Pt is present today with a cough and congestion. Pt states sx stated x2 days ago

## 2020-12-28 ENCOUNTER — Encounter: Payer: Medicare Other | Admitting: Physical Medicine & Rehabilitation

## 2020-12-29 ENCOUNTER — Other Ambulatory Visit: Payer: Self-pay | Admitting: Family

## 2021-01-07 ENCOUNTER — Other Ambulatory Visit: Payer: Self-pay | Admitting: Family

## 2021-01-07 ENCOUNTER — Other Ambulatory Visit: Payer: Self-pay | Admitting: Internal Medicine

## 2021-01-11 ENCOUNTER — Other Ambulatory Visit: Payer: Self-pay | Admitting: Family

## 2021-01-18 ENCOUNTER — Other Ambulatory Visit: Payer: Self-pay

## 2021-01-18 ENCOUNTER — Ambulatory Visit (INDEPENDENT_AMBULATORY_CARE_PROVIDER_SITE_OTHER): Payer: Medicare Other | Admitting: Family

## 2021-01-18 ENCOUNTER — Encounter: Payer: Self-pay | Admitting: Family

## 2021-01-18 VITALS — BP 121/66 | HR 72 | Temp 98.7°F | Resp 16 | Wt 245.0 lb

## 2021-01-18 DIAGNOSIS — Z8639 Personal history of other endocrine, nutritional and metabolic disease: Secondary | ICD-10-CM

## 2021-01-18 DIAGNOSIS — E039 Hypothyroidism, unspecified: Secondary | ICD-10-CM | POA: Diagnosis not present

## 2021-01-18 DIAGNOSIS — E7800 Pure hypercholesterolemia, unspecified: Secondary | ICD-10-CM | POA: Diagnosis not present

## 2021-01-18 DIAGNOSIS — E119 Type 2 diabetes mellitus without complications: Secondary | ICD-10-CM | POA: Diagnosis not present

## 2021-01-18 DIAGNOSIS — K219 Gastro-esophageal reflux disease without esophagitis: Secondary | ICD-10-CM | POA: Diagnosis not present

## 2021-01-18 DIAGNOSIS — L0390 Cellulitis, unspecified: Secondary | ICD-10-CM | POA: Diagnosis not present

## 2021-01-18 DIAGNOSIS — K648 Other hemorrhoids: Secondary | ICD-10-CM | POA: Diagnosis not present

## 2021-01-18 DIAGNOSIS — Z8739 Personal history of other diseases of the musculoskeletal system and connective tissue: Secondary | ICD-10-CM | POA: Diagnosis not present

## 2021-01-18 MED ORDER — BENAZEPRIL HCL 5 MG PO TABS: ORAL_TABLET | ORAL | 1 refills | Status: DC

## 2021-01-18 MED ORDER — BENAZEPRIL HCL 10 MG PO TABS: 10.0000 mg | ORAL_TABLET | Freq: Every day | ORAL | 1 refills | Status: DC

## 2021-01-18 MED ORDER — GABAPENTIN 100 MG PO CAPS: 100.0000 mg | ORAL_CAPSULE | Freq: Every day | ORAL | 1 refills | Status: DC

## 2021-01-18 MED ORDER — CEPHALEXIN 500 MG PO CAPS: 500.0000 mg | ORAL_CAPSULE | Freq: Three times a day (TID) | ORAL | 0 refills | Status: DC

## 2021-01-18 NOTE — Assessment & Plan Note (Signed)
>>  ASSESSMENT AND PLAN FOR PURE HYPERCHOLESTEROLEMIA WRITTEN ON 01/18/2021  3:48 PM BY DARYL SETTER, NP  Lab Results  Component Value Date   CHOL 151 05/04/2020   HDL 50.70 05/04/2020   LDLCALC 75 05/04/2020   TRIG 125.0 05/04/2020   CHOLHDL 3 05/04/2020   Stable on lipitor  40mg , continue same.

## 2021-01-18 NOTE — Assessment & Plan Note (Signed)
Lab Results  Component Value Date   TSH 1.82 05/04/2020   Clinically stable. Continue synthroid, check follow up TSH.

## 2021-01-18 NOTE — Progress Notes (Addendum)
Subjective:   By signing my name below, I, Lyric Barr-McArthur, attest that this documentation has been prepared under the direction and in the presence of Debbrah Alar, NP, 01/18/2021    Patient ID: [name], male    DOB: 1945/12/26, 75 y.o.   MRN: [id number]  Chief Complaint  Patient presents with   Wound Check    Here for wound check, mid upper back     HPI Patient is in today for an office visit.  Lesion on back: He is complaining of a lesion on his back. He believes that this is due to where he wears his suspenders everyday. He notes he has been wearing suspenders for 20 years and never had a problem with this. He explains that it is sore and hurts him as well as it is draining fluid.  Internal hemorrhoids: He is complaining of discomfort due to internal hemorrhoids and would like to have a surgical referral put in to handle this concern.  A1C: His blood sugar levels have been in good range and his previous A1C was within good range. He got a wearable blood sugar monitor which he explains has been such a positive change for him and his wife.  Lab Results  Component Value Date   HGBA1C 5.6 01/18/2021  Cholesterol: His previous cholesterol was within good range. He is compliant in taking 40 mg Lipitor to help manage his cholesterol levels.  Lab Results  Component Value Date   CHOL 151 05/04/2020   HDL 50.70 05/04/2020   LDLCALC 75 05/04/2020   TRIG 125.0 05/04/2020   CHOLHDL 3 05/04/2020  Medications: He is compliant in taking his 40 mg Lipitor, 5 mg Eliquis, 325 mg ferrous sulfate, 500 mg keflex, 100 mg gabapentin, 25 mg metoprolol, 1 mg folic acid and 40 mg Protonix. Gout: He denies any recent flare ups of his gout.  Heartburn: He notes that his heartburn has been well managed with the use of 40 mg Protonix. He would like to have his Protonix updated to a 90 day supply rather than a 30 day supply.  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 He has swelling present in the tendons in his hands. Swelling: He notes that he has not had to use furosemide to help with his swelling lately.  Immunizations: He has not received his updated Covid-19 vaccine due to his pharmacy not having it. He is interested in getting it at a later date.   Health Maintenance Due  Topic Date Due   OPHTHALMOLOGY EXAM  10/16/2014   COVID-19 Vaccine (3 - Pfizer risk series) 06/29/2019    Past Medical History:  Diagnosis Date   Acute encephalopathy 12/12/2015   Acute respiratory failure with hypoxia and hypercapnia (HCC) 12/12/2015   Allergic rhinitis 02/13/2010   Qualifier: Diagnosis of  By: Wynona Luna    Angina at rest [facility]) 10/22/2015   Ataxia 08/10/2013   Atrial fibrillation ([place]) 11/2018   B12 deficiency 08/11/2013   BACK PAIN, LUMBAR 01/29/2009   Qualifier: Diagnosis of  By: Wynona Luna    BENIGN POSITIONAL VERTIGO 11/23/2009   Qualifier: Diagnosis of  By: Wynona Luna    Chronic diastolic heart failure ([place]) 04/24/2015   Chronic pain    left sided-[place]   Coronary 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 ([place]) 11/04/2015   s/p CABG 2004; CFX DES 2011   Essential hypertension 12/12/2006  Qualifier: Diagnosis of  By: Reatha Armour, [place]     GERD 12/12/2006   Qualifier: Diagnosis of  By: Reatha Armour, [place]     GOUT 12/12/2006   Qualifier: Diagnosis of  By: [name] RMA, [place]     Hemorrhoids    Hyperlipidemia LDL goal <70 12/12/2006   Qualifier: Diagnosis of  By: [name] RMA, [place]      Hypogonadism male 04/10/2011   Hypothyroidism, acquired 02/15/2013   Insomnia 05/21/2013   Long term current use of anticoagulant therapy 06/24/2011   Obstructive sleep apnea-Failed CPAP 06/17/2007   Failed CPAP Using O 2 for sleep    Paroxysmal atrial fibrillation ([place]) 12/06/2016   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/13/2017   also 1996; resultant hemiplegia of dominant side   Thoracic aorta atherosclerosis ([place]) 04/27/2017   Tubular adenoma of  colon 05/2010   Type 2 diabetes mellitus without complication, without long-term current use of insulin ([place]) 10/05/2007   Qualifier: Diagnosis of  By: Wynona Luna    Vasovagal syncope    Vasovagal syncope    Wears glasses     Past Surgical History:  Procedure Laterality Date   CARDIAC CATHETERIZATION N/A 10/25/2015   Procedure: Left Heart Cath and Cors/Grafts Angiography;  Surgeon: Troy Sine, MD;  Location: [place] [facility];  Service: Cardiovascular;  Laterality: N/A;   CARDIOVERSION  06/20/2011   Procedure: CARDIOVERSION;  Surgeon: Larey Dresser, MD;  Location: [place];  Service: Cardiovascular;  Laterality: N/A;   CARDIOVERSION N/A 04/26/2015   Procedure: CARDIOVERSION;  Surgeon: Dorothy Spark, MD;  Location: [facility] ENDOSCOPY;  Service: Cardiovascular;  Laterality: N/A;   CARDIOVERSION N/A 05/10/2015   Procedure: CARDIOVERSION;  Surgeon: Larey Dresser, MD;  Location: [place];  Service: Cardiovascular;  Laterality: N/A;   CARDIOVERSION N/A 12/10/2018   Procedure: CARDIOVERSION;  Surgeon: Sanda Klein, MD;  Location: [place];  Service: Cardiovascular;  Laterality: N/A;   CATARACT EXTRACTION  05/2010   left eye   CATARACT EXTRACTION  04/2010   right eye   COLONOSCOPY WITH PROPOFOL N/A 02/22/2020   Procedure: COLONOSCOPY WITH PROPOFOL;  Surgeon: Lavena Bullion, DO;  Location: [facility] ENDOSCOPY;  Service: Gastroenterology;  Laterality: N/A;   CORONARY ARTERY BYPASS GRAFT     stent   ESOPHAGOGASTRODUODENOSCOPY  03-25-2005   LEFT HEART CATH AND CORS/GRAFTS ANGIOGRAPHY N/A 02/19/2018   Procedure: LEFT HEART CATH AND CORS/GRAFTS ANGIOGRAPHY;  Surgeon: Martinique, Peter M, MD;  Location: [place] [facility];  Service: Cardiovascular;  Laterality: N/A;   NASAL SEPTUM SURGERY     PERCUTANEOUS PLACEMENT INTRAVASCULAR STENT CERVICAL CAROTID ARTERY     03-2009; using a drug-eluting platform of the circumflex cornoray artery with a 3.0 x 18 [employer] Promus drug-eluting  platform post dilated to 3.75 with a noncompliant balloon.   POLYPECTOMY  02/22/2020   Procedure: POLYPECTOMY;  Surgeon: Lavena Bullion, DO;  Location: [facility] ENDOSCOPY;  Service: Gastroenterology;;   TEE WITHOUT CARDIOVERSION  06/20/2011   Procedure: TRANSESOPHAGEAL ECHOCARDIOGRAM (TEE);  Surgeon: Larey Dresser, MD;  Location: [facility] ENDOSCOPY;  Service: Cardiovascular;  Laterality: N/A;    Family History  Problem Relation Age of Onset   Lung cancer Father        deceased   Stroke Mother        deceased-MINISTROKES   Colon cancer Neg Hx    Esophageal cancer Neg Hx    Stomach cancer Neg Hx    Pancreatic cancer Neg Hx    Liver disease Neg  Hx     Social History   Socioeconomic History   Marital status: Married    Spouse name: [name]   Number of children: Not on file   Years of education: 14   Highest education level: Not on file  Occupational History   Occupation: retired    Fish farm manager: OTHER    Comment: Mining engineer  Tobacco Use   Smoking status: Never   Smokeless tobacco: Never  Vaping Use   Vaping Use: Never used  Substance and Sexual Activity   Alcohol use: Not Currently   Drug use: No   Sexual activity: Not Currently  Other Topics Concern   Not on file  Social History Narrative   Pt lives with wife. Does have stairs, but patient doesn't use them. Pt has completed technical school   Social Determinants of Health   Financial Resource Strain: Not on file  Food Insecurity: Not on file  Transportation Needs: Not on file  Physical Activity: Not on file  Stress: Not on file  Social Connections: Not on file  Intimate Partner Violence: Not on file    Outpatient Medications Prior to Visit  Medication Sig Dispense Refill   allopurinol (ZYLOPRIM) 100 MG tablet TAKE 2 TABLETS BY MOUTH EVERY DAY (Patient taking differently: Take 100 mg by mouth 2 (two) times daily.) 180 tablet 1   atorvastatin (LIPITOR) 40 MG tablet TAKE 1 TABLET BY MOUTH EVERY DAY 90 tablet 1   blood  glucose meter kit and supplies KIT Dispense based on patient and insurance preference. Use up to four times daily as directed. (FOR ICD-9 250.00, 250.01). 102 each 0   calcium carbonate (TUMS - DOSED IN MG ELEMENTAL CALCIUM) 500 MG chewable tablet Chew 1 tablet by mouth daily as needed for indigestion or heartburn.     Continuous Blood Gluc Receiver (FREESTYLE LIBRE 2 READER) DEVI Use as directed 1 each 0   Continuous Blood Gluc Sensor (FREESTYLE LIBRE 14 DAY SENSOR) MISC USE AS DIRECTED 1 each 5   cyanocobalamin 1000 MCG tablet Take 2,000 mcg by mouth daily. Take 2 Gummies daily     diltiazem (CARDIZEM CD) 180 MG 24 hr capsule Take 1 capsule (180 mg total) by mouth daily. 90 capsule 1   dofetilide (TIKOSYN) 500 MCG capsule Take 1 capsule (500 mcg total) by mouth 2 (two) times daily. 180 capsule 1   ELIQUIS 5 MG TABS tablet TAKE 1 TABLET BY MOUTH TWICE A DAY 180 tablet 1   ferrous sulfate 325 (65 FE) MG tablet Take 1 tablet (325 mg total) by mouth 2 (two) times daily with a meal. (Patient taking differently: Take 650 mg by mouth every evening.) 180 tablet 1   fluticasone (FLONASE) 50 MCG/ACT nasal spray Place 1 spray into both nostrils daily. 16 g 0   folic acid (FOLVITE) 1 MG tablet TAKE 1 TABLET BY MOUTH EVERY DAY 90 tablet 0   furosemide (LASIX) 40 MG tablet Take 1 tablet (40 mg total) by mouth daily as needed. 30 tablet 1   glucose blood (ACCU-CHEK GUIDE) test strip USE AS DIRECTED UP TO 4 TIMES DAILY 100 strip 0   levothyroxine (SYNTHROID) 112 MCG tablet Take 1 tablet (112 mcg total) by mouth daily before breakfast. NEEDS OFFICE VISIT BEFORE ANY FURTHER REFILLS 90 tablet 0   metoprolol succinate (TOPROL-XL) 25 MG 24 hr tablet TAKE 1 TAB BY MOUTH DAILY. PLEASE MAKE OVERDUE VISIT 30 tablet 0   Multiple Vitamins-Minerals (MULTIVITAMIN GUMMIES MENS PO) Take 2 tablets by mouth  daily with breakfast.      nitroGLYCERIN (NITROSTAT) 0.4 MG SL tablet Place 1 tablet (0.4 mg total) under the tongue every 5  (five) minutes as needed for chest pain. Up to 3 doses 10 tablet 2   OXYGEN Inhale 2 L into the lungs at bedtime.     pantoprazole (PROTONIX) 40 MG tablet Take 1 tablet (40 mg total) by mouth daily before breakfast. 90 tablet 3   sennosides-docusate sodium (SENOKOT-S) 8.6-50 MG tablet Take 1 tablet by mouth as needed for constipation.     traMADol (ULTRAM) 50 MG tablet TAKE 1 TABLET BY MOUTH TWICE A DAY 180 tablet 1   traMADol (ULTRAM-ER) 200 MG 24 hr tablet Take 1 tablet (200 mg total) by mouth daily. 90 tablet 0   benazepril (LOTENSIN) 10 MG tablet TAKE 1 TABLET (10 MG TOTAL) BY MOUTH DAILY. TAKE WITH ADDITIONAL 5MG TABLET OF BENAZEPRIL DAILY 90 tablet 1   benazepril (LOTENSIN) 5 MG tablet TAKE 1 TABLET BY MOUTH DAILY WITH 10MG OF BENAZEPRIL TO EQUAL 15MG DAILY 90 tablet 1   diclofenac Sodium (VOLTAREN) 1 % GEL USE AS DIRECTED 100 g 6   gabapentin (NEURONTIN) 100 MG capsule Take 1 capsule (100 mg total) by mouth at bedtime. 30 capsule 1   metFORMIN (GLUCOPHAGE) 500 MG tablet TAKE 1 TABLET BY MOUTH EVERY DAY WITH BREAKFAST 30 tablet 0   No facility-administered medications prior to visit.    No Known Allergies  Review of Systems  Constitutional:        (-) swelling  Gastrointestinal:  Negative for heartburn (resolved with use of Protonix).       (+) pain from internal hemorrhoids   Skin:        (+) lesion on back      Objective:    Physical Exam Constitutional:      General: He is not in acute distress.    Appearance: Normal appearance. He is not ill-appearing.  HENT:     Head: Normocephalic and atraumatic.     Right Ear: External ear normal.     Left Ear: External ear normal.  Eyes:     Extraocular Movements: Extraocular movements intact.     Pupils: Pupils are equal, round, and reactive to light.  Cardiovascular:     Rate and Rhythm: Normal rate and regular rhythm.     Heart sounds: Normal heart sounds. No murmur heard.   No gallop.  Pulmonary:     Effort: Pulmonary  effort is normal. No respiratory distress.     Breath sounds: Normal breath sounds. No wheezing or rales.  Skin:    General: Skin is warm and dry.     Findings: Lesion (scab lesion on back with surrounding erythema) present.  Neurological:     Mental Status: He is alert and oriented to person, place, and time.  Psychiatric:        Behavior: Behavior normal.        Judgment: Judgment normal.    BP 121/66 (BP Location: Right Arm, Patient Position: Sitting, Cuff Size: Large)   Pulse 72   Temp 98.7 F (37.1 C) (Oral)   Resp 16   Wt 245 lb (111.1 kg)   SpO2 97%   BMI 30.62 kg/m  Wt Readings from Last 3 Encounters:  01/18/21 245 lb (111.1 kg)  12/11/20 244 lb (110.7 kg)  11/25/20 240 lb 4.8 oz (109 kg)        Assessment & Plan:   Problem List Items Addressed  This Visit       Unprioritized   Wound cellulitis    New.  Will rx with keflex.  Follow up in 10 days.       Type 2 diabetes mellitus without complication, without long-term current use of insulin (HCC) - Primary    Lab Results  Component Value Date   HGBA1C 5.9 05/04/2020   HGBA1C 6.2 01/24/2020   HGBA1C 5.9 05/25/2019   Lab Results  Component Value Date   MICROALBUR <0.7 09/26/2014   LDLCALC 75 05/04/2020   CREATININE 1.19 12/11/2020        Relevant Medications   benazepril (LOTENSIN) 10 MG tablet   benazepril (LOTENSIN) 5 MG tablet   Other Relevant Orders   Hemoglobin A1c (Completed)   Pure hypercholesterolemia    Lab Results  Component Value Date   CHOL 151 05/04/2020   HDL 50.70 05/04/2020   LDLCALC 75 05/04/2020   TRIG 125.0 05/04/2020   CHOLHDL 3 05/04/2020  Stable on lipitor 535m, continue same.       Relevant Medications   benazepril (LOTENSIN) 10 MG tablet   benazepril (LOTENSIN) 5 MG tablet   Hypothyroidism   Relevant Orders   TSH (Completed)   History of hypothyroidism    Lab Results  Component Value Date   TSH 1.82 05/04/2020  Clinically stable. Continue synthroid, check  follow up TSH.       History of gout    Stable. Continue allopurinol.       GERD    Stable on protonix 496monce daily.       Other Visit Diagnoses     Internal hemorrhoids       Relevant Medications   benazepril (LOTENSIN) 10 MG tablet   benazepril (LOTENSIN) 5 MG tablet   Other Relevant Orders   Ambulatory referral to General Surgery      Meds ordered this encounter  Medications   cephALEXin (KEFLEX) 500 MG capsule    Sig: Take 1 capsule (500 mg total) by mouth 3 (three) times daily.    Dispense:  21 capsule    Refill:  0    Order Specific Question:   Supervising Provider    Answer:   BLPenni Homans [[id number]]   benazepril (LOTENSIN) 10 MG tablet    Sig: Take 1 tablet (10 mg total) by mouth daily. Take with additional 35m80mablet of benazepril daily    Dispense:  90 tablet    Refill:  1    Order Specific Question:   Supervising Provider    Answer:   BLYPenni Homans[[id number]]   benazepril (LOTENSIN) 5 MG tablet    Sig: TAKE 1 TABLET BY MOUTH DAILY WITH 10MG OF BENAZEPRIL TO EQUAL 15MG DAILY    Dispense:  90 tablet    Refill:  1    Order Specific Question:   Supervising Provider    Answer:   BLYPenni Homans[[id number]]   gabapentin (NEURONTIN) 100 MG capsule    Sig: Take 1 capsule (100 mg total) by mouth at bedtime.    Dispense:  90 capsule    Refill:  1    Order Specific Question:   Supervising Provider    Answer:   BLYPenni Homans[[id number]]    I, MelDebbrah AlarP, personally preformed the services described in this documentation.  All medical record entries made by the scribe were at my direction and in my presence.  I have reviewed the chart and discharge instructions (if applicable) and  agree that the record reflects my personal performance and is accurate and complete. 01/18/2021  I,Lyric Barr-McArthur,acting as a Education administrator for [name], NP.,have documented all relevant documentation on the behalf of [name], NP,as directed by  [name], NP while in the presence of [name], NP.  [name], NP

## 2021-01-18 NOTE — Assessment & Plan Note (Signed)
Stable. Continue allopurinol. 

## 2021-01-18 NOTE — Patient Instructions (Addendum)
Keep a bandage over the scab on your back to avoid rubbing. Start keflex (antibiotic) 3 times a day for 1 week.   Complete lab work prior to leaving.

## 2021-01-18 NOTE — Assessment & Plan Note (Signed)
Lab Results  Component Value Date   HGBA1C 5.9 05/04/2020   HGBA1C 6.2 01/24/2020   HGBA1C 5.9 05/25/2019   Lab Results  Component Value Date   MICROALBUR <0.7 09/26/2014   LDLCALC 75 05/04/2020   CREATININE 1.19 12/11/2020

## 2021-01-18 NOTE — Assessment & Plan Note (Signed)
Stable on protonix 40mg  once daily.

## 2021-01-18 NOTE — Assessment & Plan Note (Signed)
Lab Results  Component Value Date   CHOL 151 05/04/2020   HDL 50.70 05/04/2020   LDLCALC 75 05/04/2020   TRIG 125.0 05/04/2020   CHOLHDL 3 05/04/2020   Stable on lipitor 40mg , continue same.

## 2021-01-19 LAB — HEMOGLOBIN A1C
Hgb A1c MFr Bld: 5.6 % of total Hgb (ref <5.7)
Mean Plasma Glucose: 114 mg/dL
eAG (mmol/L): 6.3 mmol/L

## 2021-01-19 LAB — TSH: TSH: 1.47 mIU/L (ref 0.40–4.50)

## 2021-01-20 ENCOUNTER — Telehealth: Payer: Self-pay | Admitting: Family

## 2021-01-20 ENCOUNTER — Other Ambulatory Visit: Payer: Self-pay | Admitting: Family

## 2021-01-20 DIAGNOSIS — L0390 Cellulitis, unspecified: Secondary | ICD-10-CM | POA: Insufficient documentation

## 2021-01-20 NOTE — Assessment & Plan Note (Signed)
New.  Will rx with keflex.  Follow up in 10 days.

## 2021-01-20 NOTE — Telephone Encounter (Signed)
A1C looks great.  He can stop metformin.

## 2021-01-21 NOTE — Telephone Encounter (Signed)
Called a few times but no answer and no voice mail set up

## 2021-01-23 NOTE — Telephone Encounter (Signed)
Unable to leave vm, Mychart message and letter mailed for patient to be aware he can stop taking metformin.

## 2021-01-28 ENCOUNTER — Ambulatory Visit: Payer: Medicare Other | Admitting: Family

## 2021-01-29 ENCOUNTER — Ambulatory Visit (INDEPENDENT_AMBULATORY_CARE_PROVIDER_SITE_OTHER): Payer: Medicare Other | Admitting: Family

## 2021-01-29 ENCOUNTER — Other Ambulatory Visit: Payer: Self-pay

## 2021-01-29 VITALS — BP 122/69 | HR 65 | Temp 98.2°F | Resp 16 | Ht 75.0 in | Wt 245.0 lb

## 2021-01-29 DIAGNOSIS — L219 Seborrheic dermatitis, unspecified: Secondary | ICD-10-CM | POA: Insufficient documentation

## 2021-01-29 DIAGNOSIS — L0390 Cellulitis, unspecified: Secondary | ICD-10-CM | POA: Diagnosis not present

## 2021-01-29 NOTE — Assessment & Plan Note (Signed)
Pt would like a referral to dermatology.

## 2021-01-29 NOTE — Assessment & Plan Note (Signed)
Improved.  Please call [facility] to schedule your appointment:  [phone] Call if increased redness, swelling, pain, drainage or if area does not continue to improve

## 2021-01-29 NOTE — Patient Instructions (Signed)
Please call [facility] to schedule your appointment:  [phone] Call if increased redness, swelling, pain, drainage or if area does not continue to improve.

## 2021-01-29 NOTE — Progress Notes (Signed)
Subjective:   By signing my name below, I, Lyric Barr-McArthur, attest that this documentation has been prepared under the direction and in the presence of Debbrah Alar, NP, 01/29/2021   Patient ID: [name], male    DOB: 1945/11/20, 75 y.o.   MRN: [id number]  Chief Complaint  Patient presents with   Wound Check    Here for follow up on wound, upper back    HPI Patient is in today for an office visit.  Abscess on back: He came in last week for a visit with an abscess on his back that he thought came from friction due to his suspenders. It was treated using an oral antibiotic and has improved since.   Psoriasis: He complains of psoriasis on his scalp that he has had for years which is not improving with use of psoriasis shampoo. He notes that after exfoliating and scrubbing his scalp the dry skin remains and his specialized shampoo is no longer helping.  Hemorrhoids: He is complaining of his hemorrhoids still bothering him. A referral was sent to a surgeon and he will give them a call after this appointment to follow up with them.   Health Maintenance Due  Topic Date Due   OPHTHALMOLOGY EXAM  10/16/2014   COVID-19 Vaccine (3 - Pfizer risk series) 06/29/2019   FOOT EXAM  01/23/2021    Past Medical History:  Diagnosis Date   Acute encephalopathy 12/12/2015   Acute respiratory failure with hypoxia and hypercapnia (HCC) 12/12/2015   Allergic rhinitis 02/13/2010   Qualifier: Diagnosis of  By: Wynona Luna    Angina at rest [facility]) 10/22/2015   Ataxia 08/10/2013   Atrial fibrillation ([place]) 11/2018   B12 deficiency 08/11/2013   BACK PAIN, LUMBAR 01/29/2009   Qualifier: Diagnosis of  By: Wynona Luna    BENIGN POSITIONAL VERTIGO 11/23/2009   Qualifier: Diagnosis of  By: Wynona Luna    Chronic diastolic heart failure ([place]) 04/24/2015   Chronic pain    left sided-[place]   Coronary 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 ([place]) 11/04/2015   s/p  CABG 2004; CFX DES 2011   Essential hypertension 12/12/2006   Qualifier: Diagnosis of  By: [name] RMA, Lucy     GERD 12/12/2006   Qualifier: Diagnosis of  By: Reatha Armour, Lucy     GOUT 12/12/2006   Qualifier: Diagnosis of  By: [name] RMA, Lucy     Hemorrhoids    Hyperlipidemia LDL goal <70 12/12/2006   Qualifier: Diagnosis of  By: [name] RMA, [place]      Hypogonadism male 04/10/2011   Hypothyroidism, acquired 02/15/2013   Insomnia 05/21/2013   Long term current use of anticoagulant therapy 06/24/2011   Obstructive sleep apnea-Failed CPAP 06/17/2007   Failed CPAP Using O 2 for sleep    Paroxysmal atrial fibrillation ([place]) 12/06/2016   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/13/2017   also 1996; resultant hemiplegia of dominant side   Thoracic aorta atherosclerosis ([place]) 04/27/2017   Tubular adenoma of colon 05/2010   Type 2 diabetes mellitus without complication, without long-term current use of insulin ([place]) 10/05/2007   Qualifier: Diagnosis of  By: Wynona Luna    Vasovagal syncope    Vasovagal syncope    Wears glasses     Past Surgical History:  Procedure Laterality Date   CARDIAC CATHETERIZATION N/A 10/25/2015   Procedure: Left Heart Cath and Cors/Grafts Angiography;  Surgeon: Troy Sine, MD;  Location: [facility]  INVASIVE CV LAB;  Service: Cardiovascular;  Laterality: N/A;   CARDIOVERSION  06/20/2011   Procedure: CARDIOVERSION;  Surgeon: Larey Dresser, MD;  Location: [place];  Service: Cardiovascular;  Laterality: N/A;   CARDIOVERSION N/A 04/26/2015   Procedure: CARDIOVERSION;  Surgeon: Dorothy Spark, MD;  Location: [facility] ENDOSCOPY;  Service: Cardiovascular;  Laterality: N/A;   CARDIOVERSION N/A 05/10/2015   Procedure: CARDIOVERSION;  Surgeon: Larey Dresser, MD;  Location: [place];  Service: Cardiovascular;  Laterality: N/A;   CARDIOVERSION N/A 12/10/2018   Procedure: CARDIOVERSION;  Surgeon: Sanda Klein, MD;  Location: [place];  Service: Cardiovascular;  Laterality: N/A;   CATARACT  EXTRACTION  05/2010   left eye   CATARACT EXTRACTION  04/2010   right eye   COLONOSCOPY WITH PROPOFOL N/A 02/22/2020   Procedure: COLONOSCOPY WITH PROPOFOL;  Surgeon: Lavena Bullion, DO;  Location: [facility] ENDOSCOPY;  Service: Gastroenterology;  Laterality: N/A;   CORONARY ARTERY BYPASS GRAFT     stent   ESOPHAGOGASTRODUODENOSCOPY  03-25-2005   LEFT HEART CATH AND CORS/GRAFTS ANGIOGRAPHY N/A 02/19/2018   Procedure: LEFT HEART CATH AND CORS/GRAFTS ANGIOGRAPHY;  Surgeon: Martinique, Peter M, MD;  Location: [place] CV LAB;  Service: Cardiovascular;  Laterality: N/A;   NASAL SEPTUM SURGERY     PERCUTANEOUS PLACEMENT INTRAVASCULAR STENT CERVICAL CAROTID ARTERY     03-2009; using a drug-eluting platform of the circumflex cornoray artery with a 3.0 x 18 [employer] Promus drug-eluting platform post dilated to 3.75 with a noncompliant balloon.   POLYPECTOMY  02/22/2020   Procedure: POLYPECTOMY;  Surgeon: Lavena Bullion, DO;  Location: [facility] ENDOSCOPY;  Service: Gastroenterology;;   TEE WITHOUT CARDIOVERSION  06/20/2011   Procedure: TRANSESOPHAGEAL ECHOCARDIOGRAM (TEE);  Surgeon: Larey Dresser, MD;  Location: [facility] ENDOSCOPY;  Service: Cardiovascular;  Laterality: N/A;    Family History  Problem Relation Age of Onset   Lung cancer Father        deceased   Stroke Mother        deceased-MINISTROKES   Colon cancer Neg Hx    Esophageal cancer Neg Hx    Stomach cancer Neg Hx    Pancreatic cancer Neg Hx    Liver disease Neg Hx     Social History   Socioeconomic History   Marital status: Married    Spouse name: [name]   Number of children: Not on file   Years of education: 14   Highest education level: Not on file  Occupational History   Occupation: retired    Fish farm manager: OTHER    Comment: Mining engineer  Tobacco Use   Smoking status: Never   Smokeless tobacco: Never  Vaping Use   Vaping Use: Never used  Substance and Sexual Activity   Alcohol use: Not Currently   Drug use: No    Sexual activity: Not Currently  Other Topics Concern   Not on file  Social History Narrative   Pt lives with wife. Does have stairs, but patient doesn't use them. Pt has completed technical school   Social Determinants of Health   Financial Resource Strain: Not on file  Food Insecurity: Not on file  Transportation Needs: Not on file  Physical Activity: Not on file  Stress: Not on file  Social Connections: Not on file  Intimate Partner Violence: Not on file    Outpatient Medications Prior to Visit  Medication Sig Dispense Refill   allopurinol (ZYLOPRIM) 100 MG tablet TAKE 2 TABLETS BY MOUTH EVERY DAY (Patient taking differently: Take 100  mg by mouth 2 (two) times daily.) 180 tablet 1   atorvastatin (LIPITOR) 40 MG tablet TAKE 1 TABLET BY MOUTH EVERY DAY 90 tablet 1   benazepril (LOTENSIN) 10 MG tablet Take 1 tablet (10 mg total) by mouth daily. Take with additional 48m tablet of benazepril daily 90 tablet 1   benazepril (LOTENSIN) 5 MG tablet TAKE 1 TABLET BY MOUTH DAILY WITH 10MG OF BENAZEPRIL TO EQUAL 15MG DAILY 90 tablet 1   blood glucose meter kit and supplies KIT Dispense based on patient and insurance preference. Use up to four times daily as directed. (FOR ICD-9 250.00, 250.01). 102 each 0   calcium carbonate (TUMS - DOSED IN MG ELEMENTAL CALCIUM) 500 MG chewable tablet Chew 1 tablet by mouth daily as needed for indigestion or heartburn.     cephALEXin (KEFLEX) 500 MG capsule Take 1 capsule (500 mg total) by mouth 3 (three) times daily. 21 capsule 0   Continuous Blood Gluc Receiver (FREESTYLE LIBRE 2 READER) DEVI Use as directed 1 each 0   Continuous Blood Gluc Sensor (FREESTYLE LIBRE 14 DAY SENSOR) MISC USE AS DIRECTED 1 each 5   cyanocobalamin 1000 MCG tablet Take 2,000 mcg by mouth daily. Take 2 Gummies daily     diltiazem (CARDIZEM CD) 180 MG 24 hr capsule Take 1 capsule (180 mg total) by mouth daily. 90 capsule 1   dofetilide (TIKOSYN) 500 MCG capsule Take 1 capsule (500 mcg  total) by mouth 2 (two) times daily. 180 capsule 1   ELIQUIS 5 MG TABS tablet TAKE 1 TABLET BY MOUTH TWICE A DAY 180 tablet 1   ferrous sulfate 325 (65 FE) MG tablet Take 1 tablet (325 mg total) by mouth 2 (two) times daily with a meal. (Patient taking differently: Take 650 mg by mouth every evening.) 180 tablet 1   fluticasone (FLONASE) 50 MCG/ACT nasal spray Place 1 spray into both nostrils daily. 16 g 0   folic acid (FOLVITE) 1 MG tablet TAKE 1 TABLET BY MOUTH EVERY DAY 90 tablet 0   furosemide (LASIX) 40 MG tablet Take 1 tablet (40 mg total) by mouth daily as needed. 30 tablet 1   gabapentin (NEURONTIN) 100 MG capsule Take 1 capsule (100 mg total) by mouth at bedtime. 90 capsule 1   glucose blood (ACCU-CHEK GUIDE) test strip USE AS DIRECTED UP TO 4 TIMES DAILY 100 strip 0   levothyroxine (SYNTHROID) 112 MCG tablet Take 1 tablet (112 mcg total) by mouth daily before breakfast. NEEDS OFFICE VISIT BEFORE ANY FURTHER REFILLS 90 tablet 0   metoprolol succinate (TOPROL-XL) 25 MG 24 hr tablet TAKE 1 TAB BY MOUTH DAILY. PLEASE MAKE OVERDUE VISIT 30 tablet 0   Multiple Vitamins-Minerals (MULTIVITAMIN GUMMIES MENS PO) Take 2 tablets by mouth daily with breakfast.      nitroGLYCERIN (NITROSTAT) 0.4 MG SL tablet Place 1 tablet (0.4 mg total) under the tongue every 5 (five) minutes as needed for chest pain. Up to 3 doses 10 tablet 2   OXYGEN Inhale 2 L into the lungs at bedtime.     pantoprazole (PROTONIX) 40 MG tablet Take 1 tablet (40 mg total) by mouth daily before breakfast. 90 tablet 3   sennosides-docusate sodium (SENOKOT-S) 8.6-50 MG tablet Take 1 tablet by mouth as needed for constipation.     traMADol (ULTRAM) 50 MG tablet TAKE 1 TABLET BY MOUTH TWICE A DAY 180 tablet 1   traMADol (ULTRAM-ER) 200 MG 24 hr tablet Take 1 tablet (200 mg total) by mouth  daily. 90 tablet 0   No facility-administered medications prior to visit.    No Known Allergies  Review of Systems  Gastrointestinal:        (+)  hemorrhoids   Skin:        (+) dry skin on scalp (+) scab from previous abscess       Objective:    Physical Exam Constitutional:      General: He is not in acute distress.    Appearance: Normal appearance. He is not ill-appearing.  HENT:     Head: Normocephalic and atraumatic.     Right Ear: External ear normal.     Left Ear: External ear normal.  Eyes:     Extraocular Movements: Extraocular movements intact.     Pupils: Pupils are equal, round, and reactive to light.  Skin:    General: Skin is warm and dry.     Comments: (+) small scab remains in area where previous abscess was located (+) dry skin noted on the scalp  Neurological:     Mental Status: He is alert and oriented to person, place, and time.  Psychiatric:        Behavior: Behavior normal.        Judgment: Judgment normal.     BP 122/69 (BP Location: Right Arm, Patient Position: Sitting, Cuff Size: Large)   Pulse 65   Temp 98.2 F (36.8 C) (Oral)   Resp 16   Ht _0  (1.905 m)   Wt 245 lb (111.1 kg)   SpO2 97%   BMI 30.62 kg/m  Wt Readings from Last 3 Encounters:  01/29/21 245 lb (111.1 kg)  01/18/21 245 lb (111.1 kg)  12/11/20 244 lb (110.7 kg)   Assessment & Plan:   Problem List Items Addressed This Visit       Unprioritized   Wound cellulitis    Improved.  Please call [facility] to schedule your appointment:  [phone] Call if increased redness, swelling, pain, drainage or if area does not continue to improve      Seborrheic eczema of scalp - Primary    Pt would like a referral to dermatology.        Relevant Orders   Ambulatory referral to Dermatology   No orders of the defined types were placed in this encounter.   I, Debbrah Alar, NP, personally preformed the services described in this documentation.  All medical record entries made by the scribe were at my direction and in my presence.  I have reviewed the chart and discharge instructions (if applicable) and  agree that the record reflects my personal performance and is accurate and complete. 01/29/2021  I,Lyric Barr-McArthur,acting as a Education administrator for [name], NP.,have documented all relevant documentation on the behalf of [name], NP,as directed by  [name], NP while in the presence of [name], NP.  [name], NP

## 2021-02-10 ENCOUNTER — Other Ambulatory Visit: Payer: Self-pay | Admitting: Internal Medicine

## 2021-02-10 ENCOUNTER — Other Ambulatory Visit: Payer: Self-pay | Admitting: Family

## 2021-02-15 ENCOUNTER — Encounter: Payer: Medicare Other | Attending: Physical Medicine & Rehabilitation | Admitting: Physical Medicine & Rehabilitation

## 2021-02-15 ENCOUNTER — Other Ambulatory Visit: Payer: Self-pay

## 2021-02-15 ENCOUNTER — Encounter: Payer: Self-pay | Admitting: Physical Medicine & Rehabilitation

## 2021-02-15 VITALS — BP 159/74 | HR 68 | Temp 98.3°F | Ht 75.0 in | Wt 245.0 lb

## 2021-02-15 DIAGNOSIS — M5417 Radiculopathy, lumbosacral region: Secondary | ICD-10-CM

## 2021-02-15 NOTE — Progress Notes (Signed)
  PROCEDURE RECORD [facility]   Name: Sloane Junkin DOB:26-Feb-1946 MRN: [id number]  Date:02/15/2021  Physician: Alysia Penna, MD    Nurse/CMA: Truman Hayward, CMA  Allergies: No Known Allergies  Consent Signed: Yes.    Is patient diabetic? Yes.    CBG today? 27  Pregnant: No. LMP: No LMP for male patient. (age [age])  Anticoagulants: no Anti-inflammatory: yes (eliquis, stopped 4 days ago) Antibiotics: no  Procedure: Left Paramedian Translaminar  Position: Prone Start Time: 11:08 am  End Time: 11:15 am  Fluoro Time: 22  RN/CMA Truman Hayward, CMA Mahari Strahm, CMA    Time 10:02 am 11:25 am    BP 159/74 150/72    Pulse 68 54    Respirations 16 16    O2 Sat 95 98    S/S 6 6    Pain Level 7/10 3/10     D/C home with no one, patient A & O X 3, D/C instructions reviewed, and sits independently.

## 2021-02-15 NOTE — Patient Instructions (Signed)

## 2021-02-15 NOTE — Progress Notes (Signed)
Left L4-5 translaminar Lumbar epidural steroid injection under fluoroscopic guidance  Indication: Lumbosacral radiculitis is not relieved by medication management or other conservative care and interfering with self-care and mobility.  No  anticoagulant use.Off Eliquis x 4d  Informed consent was obtained after describing risk and benefits of the procedure with the patient, this includes bleeding, bruising, infection, paralysis and medication side effects.  The patient wishes to proceed and has given written consent.  Patient was placed in a prone position.  The lumbar area was marked and prepped with Betadine.  It was entered with a 25-gauge 1-1/2 inch needle and one mL of 1% lidocaine was injected into the skin and subcutaneous tissue.  Then a 17-gauge spinal needle was inserted under fluoroscopic guidance into the Left paramedian L4-5 interlaminar space under AP and Lateral imaging.  Once needle tip of approximated the posterior elements, a loss of resistance technique was utilized with lateral imaging.  A positive loss of resistance was obtained and then confirmed by injecting 2 mL's of Omnipaque 180.  Then a solution containing 1.5 mL's of 6mg /ml Celestone and 1.5 mL's of 1% lidocaine was injected.  The patient tolerated procedure well.  Post procedure instructions were given.  Please see post procedure form.   May resume Eliquis in am

## 2021-02-16 ENCOUNTER — Other Ambulatory Visit: Payer: Self-pay | Admitting: Family

## 2021-02-21 ENCOUNTER — Other Ambulatory Visit: Payer: Self-pay | Admitting: Family

## 2021-02-21 NOTE — Progress Notes (Signed)
PCP:  Debbrah Alar, NP Primary Cardiologist: Candee Furbish, MD Electrophysiologist: Virl Axe, MD   [name] is a 75 y.o. male seen today for Virl Axe, MD for routine electrophysiology followup.  Since last being seen in our clinic the patient reports doing very well. He has struggled to get an appointment.  he denies chest pain, palpitations, dyspnea, PND, orthopnea, nausea, vomiting, dizziness, syncope, edema, weight gain, or early satiety. His edema has improved to the point where he seldom needs ted hose.   Past Medical History:  Diagnosis Date   Acute encephalopathy 12/12/2015   Acute respiratory failure with hypoxia and hypercapnia (HCC) 12/12/2015   Allergic rhinitis 02/13/2010   Qualifier: Diagnosis of  By: Wynona Luna    Angina at rest [facility]) 10/22/2015   Ataxia 08/10/2013   Atrial fibrillation ([place]) 11/2018   B12 deficiency 08/11/2013   BACK PAIN, LUMBAR 01/29/2009   Qualifier: Diagnosis of  By: Wynona Luna    BENIGN POSITIONAL VERTIGO 11/23/2009   Qualifier: Diagnosis of  By: Wynona Luna    Chronic diastolic heart failure ([place]) 04/24/2015   Chronic pain    left sided-[place]   Coronary 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 ([place]) 11/04/2015   s/p CABG 2004; CFX DES 2011   Essential hypertension 12/12/2006   Qualifier: Diagnosis of  By: [name] RMA, [place]     GERD 12/12/2006   Qualifier: Diagnosis of  By: Reatha Armour, [place]     GOUT 12/12/2006   Qualifier: Diagnosis of  By: [name] RMA, [place]     Hemorrhoids    Hyperlipidemia LDL goal <70 12/12/2006   Qualifier: Diagnosis of  By: [name] RMA, [place]      Hypogonadism male 04/10/2011   Hypothyroidism, acquired 02/15/2013   Insomnia 05/21/2013   Long term current use of anticoagulant therapy 06/24/2011   Obstructive sleep apnea-Failed CPAP 06/17/2007   Failed CPAP Using O 2 for sleep    Paroxysmal atrial fibrillation ([place]) 12/06/2016   Stroke (cerebrum) ([place]) 01/13/2017   also 1996;  resultant hemiplegia of dominant side   Thoracic aorta atherosclerosis ([place]) 04/27/2017   Tubular adenoma of colon 05/2010   Type 2 diabetes mellitus without complication, without long-term current use of insulin ([place]) 10/05/2007   Qualifier: Diagnosis of  By: Wynona Luna    Vasovagal syncope    Vasovagal syncope    Wears glasses    Past Surgical History:  Procedure Laterality Date   CARDIAC CATHETERIZATION N/A 10/25/2015   Procedure: Left Heart Cath and Cors/Grafts Angiography;  Surgeon: Troy Sine, MD;  Location: [place] [facility];  Service: Cardiovascular;  Laterality: N/A;   CARDIOVERSION  06/20/2011   Procedure: CARDIOVERSION;  Surgeon: Larey Dresser, MD;  Location: [place];  Service: Cardiovascular;  Laterality: N/A;   CARDIOVERSION N/A 04/26/2015   Procedure: CARDIOVERSION;  Surgeon: Dorothy Spark, MD;  Location: [facility] ENDOSCOPY;  Service: Cardiovascular;  Laterality: N/A;   CARDIOVERSION N/A 05/10/2015   Procedure: CARDIOVERSION;  Surgeon: Larey Dresser, MD;  Location: [place];  Service: Cardiovascular;  Laterality: N/A;   CARDIOVERSION N/A 12/10/2018   Procedure: CARDIOVERSION;  Surgeon: Sanda Klein, MD;  Location: [place];  Service: Cardiovascular;  Laterality: N/A;   CATARACT EXTRACTION  05/2010   left eye   CATARACT EXTRACTION  04/2010   right eye   COLONOSCOPY WITH PROPOFOL N/A 02/22/2020   Procedure: COLONOSCOPY WITH PROPOFOL;  Surgeon: Lavena Bullion, DO;  Location: [facility] ENDOSCOPY;  Service: Gastroenterology;  Laterality: N/A;   CORONARY ARTERY BYPASS GRAFT     stent   ESOPHAGOGASTRODUODENOSCOPY  03-25-2005   LEFT HEART CATH AND CORS/GRAFTS ANGIOGRAPHY N/A 02/19/2018   Procedure: LEFT HEART CATH AND CORS/GRAFTS ANGIOGRAPHY;  Surgeon: Martinique, Peter M, MD;  Location: [facility];  Service: Cardiovascular;  Laterality: N/A;   NASAL SEPTUM SURGERY     PERCUTANEOUS PLACEMENT INTRAVASCULAR STENT CERVICAL CAROTID ARTERY     03-2009; using a  drug-eluting platform of the circumflex cornoray artery with a 3.0 x 18 [employer] Promus drug-eluting platform post dilated to 3.75 with a noncompliant balloon.   POLYPECTOMY  02/22/2020   Procedure: POLYPECTOMY;  Surgeon: Lavena Bullion, DO;  Location: [facility] ENDOSCOPY;  Service: Gastroenterology;;   TEE WITHOUT CARDIOVERSION  06/20/2011   Procedure: TRANSESOPHAGEAL ECHOCARDIOGRAM (TEE);  Surgeon: Larey Dresser, MD;  Location: [facility] ENDOSCOPY;  Service: Cardiovascular;  Laterality: N/A;    Current Outpatient Medications  Medication Sig Dispense Refill   allopurinol (ZYLOPRIM) 100 MG tablet TAKE 2 TABLETS BY MOUTH EVERY DAY 180 tablet 1   atorvastatin (LIPITOR) 40 MG tablet TAKE 1 TABLET BY MOUTH EVERY DAY 90 tablet 1   benazepril (LOTENSIN) 10 MG tablet Take 1 tablet (10 mg total) by mouth daily. Take with additional 36m tablet of benazepril daily 90 tablet 1   benazepril (LOTENSIN) 5 MG tablet TAKE 1 TABLET BY MOUTH DAILY WITH 10MG OF BENAZEPRIL TO EQUAL 15MG DAILY 90 tablet 1   blood glucose meter kit and supplies KIT Dispense based on patient and insurance preference. Use up to four times daily as directed. (FOR ICD-9 250.00, 250.01). 102 each 0   calcium carbonate (TUMS - DOSED IN MG ELEMENTAL CALCIUM) 500 MG chewable tablet Chew 1 tablet by mouth daily as needed for indigestion or heartburn.     Continuous Blood Gluc Receiver (FREESTYLE LIBRE 2 READER) DEVI Use as directed 1 each 0   Continuous Blood Gluc Sensor (FREESTYLE LIBRE 14 DAY SENSOR) MISC USE AS DIRECTED 1 each 5   cyanocobalamin 1000 MCG tablet Take 2,000 mcg by mouth daily. Take 2 Gummies daily     diltiazem (CARDIZEM CD) 180 MG 24 hr capsule Take 1 capsule (180 mg total) by mouth daily. 90 capsule 1   dofetilide (TIKOSYN) 500 MCG capsule Take 1 capsule (500 mcg total) by mouth 2 (two) times daily. 180 capsule 1   ELIQUIS 5 MG TABS tablet TAKE 1 TABLET BY MOUTH TWICE A DAY 180 tablet 1   ferrous sulfate 325 (65 FE) MG  tablet Take 1 tablet (325 mg total) by mouth 2 (two) times daily with a meal. 180 tablet 1   fluticasone (FLONASE) 50 MCG/ACT nasal spray Place 1 spray into both nostrils daily. 16 g 0   folic acid (FOLVITE) 1 MG tablet TAKE 1 TABLET BY MOUTH EVERY DAY 90 tablet 0   furosemide (LASIX) 40 MG tablet TAKE 1 TABLET BY MOUTH DAILY AS NEEDED 30 tablet 0   gabapentin (NEURONTIN) 100 MG capsule Take 1 capsule (100 mg total) by mouth at bedtime. 90 capsule 1   glucose blood (ACCU-CHEK GUIDE) test strip USE AS DIRECTED UP TO 4 TIMES DAILY 100 strip 0   levothyroxine (SYNTHROID) 112 MCG tablet Take 1 tablet (112 mcg total) by mouth daily before breakfast. NEEDS OFFICE VISIT BEFORE ANY FURTHER REFILLS 90 tablet 0   metoprolol succinate (TOPROL-XL) 25 MG 24 hr tablet Take 1 tablet (25 mg total) by mouth  daily. Please make overdue appt with Dr. Caryl Comes before anymore refills. Thank you 2nd attempt 15 tablet 0   Multiple Vitamins-Minerals (MULTIVITAMIN GUMMIES MENS PO) Take 2 tablets by mouth daily with breakfast.      nitroGLYCERIN (NITROSTAT) 0.4 MG SL tablet Place 1 tablet (0.4 mg total) under the tongue every 5 (five) minutes as needed for chest pain. Up to 3 doses 10 tablet 2   OXYGEN Inhale 2 L into the lungs at bedtime.     pantoprazole (PROTONIX) 40 MG tablet Take 1 tablet (40 mg total) by mouth daily before breakfast. 90 tablet 3   sennosides-docusate sodium (SENOKOT-S) 8.6-50 MG tablet Take 1 tablet by mouth as needed for constipation.     traMADol (ULTRAM) 50 MG tablet TAKE 1 TABLET BY MOUTH TWICE A DAY 180 tablet 1   traMADol (ULTRAM-ER) 200 MG 24 hr tablet Take 1 tablet (200 mg total) by mouth daily. 90 tablet 0   No current facility-administered medications for this visit.    No Known Allergies  Social History   Socioeconomic History   Marital status: Married    Spouse name: [name]   Number of children: Not on file   Years of education: 14   Highest education level: Not on file   Occupational History   Occupation: retired    Fish farm manager: OTHER    Comment: Mining engineer  Tobacco Use   Smoking status: Never   Smokeless tobacco: Never  Vaping Use   Vaping Use: Never used  Substance and Sexual Activity   Alcohol use: Not Currently   Drug use: No   Sexual activity: Not Currently  Other Topics Concern   Not on file  Social History Narrative   Pt lives with wife. Does have stairs, but patient doesn't use them. Pt has completed technical school   Social Determinants of Health   Financial Resource Strain: Not on file  Food Insecurity: Not on file  Transportation Needs: Not on file  Physical Activity: Not on file  Stress: Not on file  Social Connections: Not on file  Intimate Partner Violence: Not on file     Review of Systems: All other systems reviewed and are otherwise negative except as noted above.  Physical Exam: Vitals:   02/22/21 0755  BP: 100/70  Pulse: 61  SpO2: 94%  Weight: 247 lb (112 kg)  Height: 6' 3"  (1.905 m)    GEN- The patient is well appearing, alert and oriented x 3 today.   HEENT: normocephalic, atraumatic; sclera clear, conjunctiva pink; hearing intact; oropharynx clear; neck supple, no JVP Lymph- no cervical lymphadenopathy Lungs- Clear to ausculation bilaterally, normal work of breathing.  No wheezes, rales, rhonchi Heart- Regular rate and rhythm, no murmurs, rubs or gallops, PMI not laterally displaced GI- soft, non-tender, non-distended, bowel sounds present, no hepatosplenomegaly Extremities- no clubbing, cyanosis, or edema; DP/PT/radial pulses 2+ bilaterally MS- no significant deformity or atrophy Skin- warm and dry, no rash or lesion Psych- euthymic mood, full affect Neuro- strength and sensation are intact  EKG is ordered. Personal review of EKG from today shows NSR at 61 bpm with stable QTc  Additional studies reviewed include: Previous EP office notes.   Assessment and Plan:  1. Persistent AFib      CHA2DS2Vasc is 7, on Eliquis, appropriately dosed EKG today shows NSR with stable QTc on Tikosyn Labs today Re-enforced Tikosyn education. It had been > 1 year since last follow up   2. CAD Denies s/s ischemia  On statin, BB, no ASA with Eliquis     c/w Dr. Jane Canary   3. Chronic CHF (diastolic) Volume status stable on exam.  On BB, ACE, diuretic tx   4. HTN Stable on current regimen   Follow up with EP APP in 6 months   Shirley Friar, [place]  02/22/21 8:17 AM

## 2021-02-22 ENCOUNTER — Other Ambulatory Visit: Payer: Self-pay

## 2021-02-22 ENCOUNTER — Encounter: Payer: Self-pay | Admitting: Student

## 2021-02-22 ENCOUNTER — Ambulatory Visit (INDEPENDENT_AMBULATORY_CARE_PROVIDER_SITE_OTHER): Payer: Medicare Other | Admitting: Student

## 2021-02-22 VITALS — BP 100/70 | HR 61 | Ht 75.0 in | Wt 247.0 lb

## 2021-02-22 DIAGNOSIS — Z5181 Encounter for therapeutic drug level monitoring: Secondary | ICD-10-CM | POA: Diagnosis not present

## 2021-02-22 DIAGNOSIS — Z79899 Other long term (current) drug therapy: Secondary | ICD-10-CM

## 2021-02-22 DIAGNOSIS — I4819 Other persistent atrial fibrillation: Secondary | ICD-10-CM

## 2021-02-22 DIAGNOSIS — I5032 Chronic diastolic (congestive) heart failure: Secondary | ICD-10-CM

## 2021-02-22 DIAGNOSIS — I10 Essential (primary) hypertension: Secondary | ICD-10-CM

## 2021-02-22 DIAGNOSIS — I2510 Atherosclerotic heart disease of native coronary artery without angina pectoris: Secondary | ICD-10-CM

## 2021-02-22 LAB — BASIC METABOLIC PANEL
BUN/Creatinine Ratio: 17 (ref 10–24)
BUN: 21 mg/dL (ref 8–27)
CO2: 26 mmol/L (ref 20–29)
Calcium: 9.3 mg/dL (ref 8.6–10.2)
Chloride: 99 mmol/L (ref 96–106)
Creatinine, Ser: 1.21 mg/dL (ref 0.76–1.27)
Glucose: 102 mg/dL — ABNORMAL HIGH (ref 70–99)
Potassium: 4.3 mmol/L (ref 3.5–5.2)
Sodium: 138 mmol/L (ref 134–144)
eGFR: 62 mL/min/{1.73_m2} (ref >59)

## 2021-02-22 LAB — MAGNESIUM: Magnesium: 2.2 mg/dL (ref 1.6–2.3)

## 2021-02-22 MED ORDER — DILTIAZEM HCL ER COATED BEADS 180 MG PO CP24: 180.0000 mg | ORAL_CAPSULE | Freq: Every day | ORAL | 3 refills | Status: DC

## 2021-02-22 MED ORDER — DOFETILIDE 500 MCG PO CAPS: 500.0000 ug | ORAL_CAPSULE | Freq: Two times a day (BID) | ORAL | 1 refills | Status: DC

## 2021-02-22 NOTE — Patient Instructions (Signed)
Medication Instructions:  Your physician recommends that you continue on your current medications as directed. Please refer to the Current Medication list given to you today.  *If you need a refill on your cardiac medications before your next appointment, please call your pharmacy*   Lab Work: TODAY: BMET, Mg  If you have labs (blood work) drawn today and your tests are completely normal, you will receive your results only by: [place] (if you have MyChart) OR A paper copy in the mail If you have any lab test that is abnormal or we need to change your treatment, we will call you to review the results.   Follow-Up: At [facility], you and your health needs are our priority.  As part of our continuing mission to provide you with exceptional heart care, we have created designated Provider Care Teams.  These Care Teams include your primary Cardiologist (physician) and Advanced Practice Providers (APPs -  Physician Assistants and Nurse Practitioners) who all work together to provide you with the care you need, when you need it.  We recommend signing up for the patient portal called "MyChart".  Sign up information is provided on this After Visit Summary.  MyChart is used to connect with patients for Virtual Visits (Telemedicine).  Patients are able to view lab/test results, encounter notes, upcoming appointments, etc.  Non-urgent messages can be sent to your provider as well.   To learn more about what you can do with MyChart, go to [email or web address].    Your next appointment:   6 month(s)  The format for your next appointment:   In Person  Provider:   You may see Virl Axe, MD or one of the following Advanced Practice Providers on your designated Care Team:   Tommye Standard, [place] "[facility]" [place], [place]

## 2021-02-23 ENCOUNTER — Other Ambulatory Visit: Payer: Self-pay | Admitting: Internal Medicine

## 2021-03-04 ENCOUNTER — Ambulatory Visit (HOSPITAL_BASED_OUTPATIENT_CLINIC_OR_DEPARTMENT_OTHER)
Admission: RE | Admit: 2021-03-04 | Discharge: 2021-03-04 | Disposition: A | Discharge status: Home / Self Care | Payer: Medicare Other | Source: Ambulatory Visit | Attending: Family | Admitting: Family

## 2021-03-04 ENCOUNTER — Other Ambulatory Visit: Payer: Self-pay

## 2021-03-04 ENCOUNTER — Ambulatory Visit (INDEPENDENT_AMBULATORY_CARE_PROVIDER_SITE_OTHER): Payer: Medicare Other | Admitting: Family

## 2021-03-04 VITALS — BP 132/78 | HR 57 | Temp 98.0°F | Resp 16 | Ht 75.0 in | Wt 249.0 lb

## 2021-03-04 DIAGNOSIS — Z8639 Personal history of other endocrine, nutritional and metabolic disease: Secondary | ICD-10-CM | POA: Diagnosis not present

## 2021-03-04 DIAGNOSIS — M79644 Pain in right finger(s): Secondary | ICD-10-CM | POA: Diagnosis not present

## 2021-03-04 DIAGNOSIS — K648 Other hemorrhoids: Secondary | ICD-10-CM | POA: Diagnosis not present

## 2021-03-04 DIAGNOSIS — E7800 Pure hypercholesterolemia, unspecified: Secondary | ICD-10-CM

## 2021-03-04 DIAGNOSIS — I2510 Atherosclerotic heart disease of native coronary artery without angina pectoris: Secondary | ICD-10-CM

## 2021-03-04 DIAGNOSIS — M1811 Unilateral primary osteoarthritis of first carpometacarpal joint, right hand: Secondary | ICD-10-CM | POA: Diagnosis not present

## 2021-03-04 DIAGNOSIS — D509 Iron deficiency anemia, unspecified: Secondary | ICD-10-CM | POA: Diagnosis not present

## 2021-03-04 DIAGNOSIS — Z8739 Personal history of other diseases of the musculoskeletal system and connective tissue: Secondary | ICD-10-CM | POA: Diagnosis not present

## 2021-03-04 DIAGNOSIS — E119 Type 2 diabetes mellitus without complications: Secondary | ICD-10-CM | POA: Diagnosis not present

## 2021-03-04 DIAGNOSIS — I10 Essential (primary) hypertension: Secondary | ICD-10-CM | POA: Diagnosis not present

## 2021-03-04 MED ORDER — LEVOTHYROXINE SODIUM 112 MCG PO TABS: 112.0000 ug | ORAL_TABLET | Freq: Every day | ORAL | 0 refills | Status: DC

## 2021-03-04 MED ORDER — ALLOPURINOL 100 MG PO TABS: 200.0000 mg | ORAL_TABLET | Freq: Every day | ORAL | 1 refills | Status: DC

## 2021-03-04 MED ORDER — FOLIC ACID 1 MG PO TABS: 1.0000 mg | ORAL_TABLET | Freq: Every day | ORAL | 1 refills | Status: DC

## 2021-03-04 NOTE — Assessment & Plan Note (Signed)
States that the surgery office did not call him to schedule an appointment. Pt has been given office number to call.

## 2021-03-04 NOTE — Patient Instructions (Addendum)
Please call the [facility] in [place]

## 2021-03-04 NOTE — Assessment & Plan Note (Signed)
Lab Results  Component Value Date   HGBA1C 5.6 01/18/2021   HGBA1C 5.9 05/04/2020   HGBA1C 6.2 01/24/2020   Lab Results  Component Value Date   MICROALBUR <0.7 09/26/2014   LDLCALC 75 05/04/2020   CREATININE 1.21 02/22/2021   A1C is at goal.

## 2021-03-04 NOTE — Assessment & Plan Note (Signed)
Lab Results  Component Value Date   TSH 1.47 01/18/2021   Stable on synthroid, continue same.

## 2021-03-04 NOTE — Assessment & Plan Note (Signed)
Lab Results  Component Value Date   WBC 6.5 12/11/2020   HGB 12.9 (L) 12/11/2020   HCT 38.7 (L) 12/11/2020   MCV 92.5 12/11/2020   PLT 148.0 (L) 12/11/2020   Stable.  Continue iron 325mg  bid.

## 2021-03-04 NOTE — Progress Notes (Signed)
Subjective:   By signing my name below, I, Shehryar Baig, attest that this documentation has been prepared under the direction and in the presence of Debbrah Alar NP. 03/04/2021    Patient ID: [name], male    DOB: November 16, 1945, 75 y.o.   MRN: [id number]  Chief Complaint  Patient presents with   Diabetes    Here for follow up   Hypertension   Hypothyroidism    Diabetes  Hypertension  Patient is in today for a office visit.   Swelling in thumb- He continues to complains of painful swelling on his right thumb. He reports the swelling is going down into his arm. He has seen a hand specialist and they found no new issues with his hand.  Hemorrhoids- He reports he continues having bleeding in his hemorrhoids. He was referred to his surgeon but reports not receiving contact from them since.  Blood pressure- His blood pressure is doing well during this visit.  Iron- He continues taking iron supplements daily PO and reports no new issues while taking it. He continues taking benazepril daily PO and reports no new issues while taking it.   BP Readings from Last 3 Encounters:  03/04/21 132/78  02/22/21 100/70  02/15/21 (!) 159/74   Pulse Readings from Last 3 Encounters:  03/04/21 (!) 57  02/22/21 61  02/15/21 68   Cholesterol- He continues taking 40 mg Lipitor daily PO and reports no new issues while taking it. His last lipid panel was last year and he is interested in receiving one during his next lab work.   Lab Results  Component Value Date   CHOL 151 05/04/2020   HDL 50.70 05/04/2020   LDLCALC 75 05/04/2020   TRIG 125.0 05/04/2020   CHOLHDL 3 05/04/2020   Left leg pain- He continues receiving injections in his back to manage his left leg pain and reports having no changes in his pain.  Dermatologist- He reports having an appointment with his dermatologist scheduled next year. He continues having issues with psoriasis on his scalp.  Gout- He reports having no  recent gout flare ups. He continues taking 100 mg allopurinol daily PO and reports no new issues while taking it.    Health Maintenance Due  Topic Date Due   OPHTHALMOLOGY EXAM  10/16/2014   COVID-19 Vaccine (3 - Pfizer risk series) 06/29/2019   FOOT EXAM  01/23/2021    Past Medical History:  Diagnosis Date   Acute encephalopathy 12/12/2015   Acute respiratory failure with hypoxia and hypercapnia (HCC) 12/12/2015   Allergic rhinitis 02/13/2010   Qualifier: Diagnosis of  By: Wynona Luna    Angina at rest [facility]) 10/22/2015   Ataxia 08/10/2013   Atrial fibrillation ([place]) 11/2018   B12 deficiency 08/11/2013   BACK PAIN, LUMBAR 01/29/2009   Qualifier: Diagnosis of  By: Wynona Luna    BENIGN POSITIONAL VERTIGO 11/23/2009   Qualifier: Diagnosis of  By: Wynona Luna    Chronic diastolic heart failure ([place]) 04/24/2015   Chronic pain    left sided-[place]   Coronary 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 ([place]) 11/04/2015   s/p CABG 2004; CFX DES 2011   Essential hypertension 12/12/2006   Qualifier: Diagnosis of  By: [name] RMA, Lucy     GERD 12/12/2006   Qualifier: Diagnosis of  By: Reatha Armour, Randall     GOUT 12/12/2006   Qualifier: Diagnosis of  By: Larose Kells  Hemorrhoids    Hyperlipidemia LDL goal <70 12/12/2006   Qualifier: Diagnosis of  By: [name] RMA, Lucy      Hypogonadism male 04/10/2011   Hypothyroidism, acquired 02/15/2013   Insomnia 05/21/2013   Long term current use of anticoagulant therapy 06/24/2011   Obstructive sleep apnea-Failed CPAP 06/17/2007   Failed CPAP Using O 2 for sleep    Paroxysmal atrial fibrillation ([place]) 12/06/2016   Stroke (cerebrum) ([place]) 01/13/2017   also 1996; resultant hemiplegia of dominant side   Thoracic aorta atherosclerosis ([place]) 04/27/2017   Tubular adenoma of colon 05/2010   Type 2 diabetes mellitus without complication, without long-term current use of insulin ([place]) 10/05/2007   Qualifier: Diagnosis of  By: Wynona Luna    Vasovagal syncope    Vasovagal syncope    Wears glasses     Past Surgical History:  Procedure Laterality Date   CARDIAC CATHETERIZATION N/A 10/25/2015   Procedure: Left Heart Cath and Cors/Grafts Angiography;  Surgeon: Troy Sine, MD;  Location: [place] [facility];  Service: Cardiovascular;  Laterality: N/A;   CARDIOVERSION  06/20/2011   Procedure: CARDIOVERSION;  Surgeon: Larey Dresser, MD;  Location: [place];  Service: Cardiovascular;  Laterality: N/A;   CARDIOVERSION N/A 04/26/2015   Procedure: CARDIOVERSION;  Surgeon: Dorothy Spark, MD;  Location: [facility] ENDOSCOPY;  Service: Cardiovascular;  Laterality: N/A;   CARDIOVERSION N/A 05/10/2015   Procedure: CARDIOVERSION;  Surgeon: Larey Dresser, MD;  Location: [place];  Service: Cardiovascular;  Laterality: N/A;   CARDIOVERSION N/A 12/10/2018   Procedure: CARDIOVERSION;  Surgeon: Sanda Klein, MD;  Location: [place];  Service: Cardiovascular;  Laterality: N/A;   CATARACT EXTRACTION  05/2010   left eye   CATARACT EXTRACTION  04/2010   right eye   COLONOSCOPY WITH PROPOFOL N/A 02/22/2020   Procedure: COLONOSCOPY WITH PROPOFOL;  Surgeon: Lavena Bullion, DO;  Location: [facility] ENDOSCOPY;  Service: Gastroenterology;  Laterality: N/A;   CORONARY ARTERY BYPASS GRAFT     stent   ESOPHAGOGASTRODUODENOSCOPY  03-25-2005   LEFT HEART CATH AND CORS/GRAFTS ANGIOGRAPHY N/A 02/19/2018   Procedure: LEFT HEART CATH AND CORS/GRAFTS ANGIOGRAPHY;  Surgeon: Martinique, Peter M, MD;  Location: [place] [facility];  Service: Cardiovascular;  Laterality: N/A;   NASAL SEPTUM SURGERY     PERCUTANEOUS PLACEMENT INTRAVASCULAR STENT CERVICAL CAROTID ARTERY     03-2009; using a drug-eluting platform of the circumflex cornoray artery with a 3.0 x 18 [employer] Promus drug-eluting platform post dilated to 3.75 with a noncompliant balloon.   POLYPECTOMY  02/22/2020   Procedure: POLYPECTOMY;  Surgeon: Lavena Bullion, DO;  Location: [facility]  ENDOSCOPY;  Service: Gastroenterology;;   TEE WITHOUT CARDIOVERSION  06/20/2011   Procedure: TRANSESOPHAGEAL ECHOCARDIOGRAM (TEE);  Surgeon: Larey Dresser, MD;  Location: [facility] ENDOSCOPY;  Service: Cardiovascular;  Laterality: N/A;    Family History  Problem Relation Age of Onset   Lung cancer Father        deceased   Stroke Mother        deceased-MINISTROKES   Colon cancer Neg Hx    Esophageal cancer Neg Hx    Stomach cancer Neg Hx    Pancreatic cancer Neg Hx    Liver disease Neg Hx     Social History   Socioeconomic History   Marital status: Married    Spouse name: [name]   Number of children: Not on file   Years of education: 14   Highest education level: Not on file  Occupational History   Occupation: retired    Fish farm manager: OTHER    Comment: Mining engineer  Tobacco Use   Smoking status: Never   Smokeless tobacco: Never  Vaping Use   Vaping Use: Never used  Substance and Sexual Activity   Alcohol use: Not Currently   Drug use: No   Sexual activity: Not Currently  Other Topics Concern   Not on file  Social History Narrative   Pt lives with wife. Does have stairs, but patient doesn't use them. Pt has completed technical school   Social Determinants of Health   Financial Resource Strain: Not on file  Food Insecurity: Not on file  Transportation Needs: Not on file  Physical Activity: Not on file  Stress: Not on file  Social Connections: Not on file  Intimate Partner Violence: Not on file    Outpatient Medications Prior to Visit  Medication Sig Dispense Refill   atorvastatin (LIPITOR) 40 MG tablet TAKE 1 TABLET BY MOUTH EVERY DAY 90 tablet 1   benazepril (LOTENSIN) 10 MG tablet Take 1 tablet (10 mg total) by mouth daily. Take with additional 37m tablet of benazepril daily 90 tablet 1   benazepril (LOTENSIN) 5 MG tablet TAKE 1 TABLET BY MOUTH DAILY WITH 10MG OF BENAZEPRIL TO EQUAL 15MG DAILY 90 tablet 1   blood glucose meter kit and supplies KIT Dispense based on  patient and insurance preference. Use up to four times daily as directed. (FOR ICD-9 250.00, 250.01). 102 each 0   calcium carbonate (TUMS - DOSED IN MG ELEMENTAL CALCIUM) 500 MG chewable tablet Chew 1 tablet by mouth daily as needed for indigestion or heartburn.     Continuous Blood Gluc Receiver (FREESTYLE LIBRE 2 READER) DEVI Use as directed 1 each 0   Continuous Blood Gluc Sensor (FREESTYLE LIBRE 14 DAY SENSOR) MISC USE AS DIRECTED 1 each 5   cyanocobalamin 1000 MCG tablet Take 2,000 mcg by mouth daily. Take 2 Gummies daily     diltiazem (CARDIZEM CD) 180 MG 24 hr capsule Take 1 capsule (180 mg total) by mouth daily. 90 capsule 3   dofetilide (TIKOSYN) 500 MCG capsule Take 1 capsule (500 mcg total) by mouth 2 (two) times daily. 180 capsule 1   ELIQUIS 5 MG TABS tablet TAKE 1 TABLET BY MOUTH TWICE A DAY 180 tablet 1   ferrous sulfate 325 (65 FE) MG tablet Take 1 tablet (325 mg total) by mouth 2 (two) times daily with a meal. 180 tablet 1   fluticasone (FLONASE) 50 MCG/ACT nasal spray Place 1 spray into both nostrils daily. 16 g 0   furosemide (LASIX) 40 MG tablet TAKE 1 TABLET BY MOUTH DAILY AS NEEDED 30 tablet 0   gabapentin (NEURONTIN) 100 MG capsule Take 1 capsule (100 mg total) by mouth at bedtime. 90 capsule 1   glucose blood (ACCU-CHEK GUIDE) test strip USE AS DIRECTED UP TO 4 TIMES DAILY 100 strip 0   metoprolol succinate (TOPROL-XL) 25 MG 24 hr tablet Take 1 tablet (25 mg total) by mouth daily. 90 tablet 3   Multiple Vitamins-Minerals (MULTIVITAMIN GUMMIES MENS PO) Take 2 tablets by mouth daily with breakfast.      nitroGLYCERIN (NITROSTAT) 0.4 MG SL tablet Place 1 tablet (0.4 mg total) under the tongue every 5 (five) minutes as needed for chest pain. Up to 3 doses 10 tablet 2   OXYGEN Inhale 2 L into the lungs at bedtime.     pantoprazole (PROTONIX) 40 MG tablet Take 1 tablet (40  mg total) by mouth daily before breakfast. 90 tablet 3   sennosides-docusate sodium (SENOKOT-S) 8.6-50 MG  tablet Take 1 tablet by mouth as needed for constipation.     traMADol (ULTRAM) 50 MG tablet TAKE 1 TABLET BY MOUTH TWICE A DAY 180 tablet 1   traMADol (ULTRAM-ER) 200 MG 24 hr tablet Take 1 tablet (200 mg total) by mouth daily. 90 tablet 0   allopurinol (ZYLOPRIM) 100 MG tablet TAKE 2 TABLETS BY MOUTH EVERY DAY 413 tablet 1   folic acid (FOLVITE) 1 MG tablet TAKE 1 TABLET BY MOUTH EVERY DAY 90 tablet 0   levothyroxine (SYNTHROID) 112 MCG tablet Take 1 tablet (112 mcg total) by mouth daily before breakfast. NEEDS OFFICE VISIT BEFORE ANY FURTHER REFILLS 90 tablet 0   No facility-administered medications prior to visit.    No Known Allergies  Review of Systems  Gastrointestinal:        (+)bleeding hemorrhoids  Musculoskeletal:        (+)painful swelling on base of 5th digit of right hand      Objective:    Physical Exam Constitutional:      General: He is not in acute distress.    Appearance: Normal appearance. He is not ill-appearing.  HENT:     Head: Normocephalic and atraumatic.     Right Ear: External ear normal.     Left Ear: External ear normal.  Eyes:     Extraocular Movements: Extraocular movements intact.     Pupils: Pupils are equal, round, and reactive to light.  Cardiovascular:     Rate and Rhythm: Normal rate and regular rhythm.     Heart sounds: Normal heart sounds. No murmur heard.   No gallop.  Pulmonary:     Effort: Pulmonary effort is normal. No respiratory distress.     Breath sounds: Normal breath sounds. No wheezing or rales.  Skin:    General: Skin is warm and dry.  Neurological:     Mental Status: He is alert and oriented to person, place, and time.  Psychiatric:        Behavior: Behavior normal.    BP 132/78 (BP Location: Right Arm, Patient Position: Sitting, Cuff Size: Small)    Pulse (!) 57    Temp 98 F (36.7 C) (Oral)    Resp 16    Ht 6' 3" (1.905 m)    Wt 249 lb (112.9 kg)    SpO2 100%    BMI 31.12 kg/m  Wt Readings from Last 3 Encounters:   03/04/21 249 lb (112.9 kg)  02/22/21 247 lb (112 kg)  02/15/21 245 lb (111.1 kg)       Assessment & Plan:   Problem List Items Addressed This Visit       Unprioritized   Type 2 diabetes mellitus without complication, without long-term current use of insulin (HCC)    Lab Results  Component Value Date   HGBA1C 5.6 01/18/2021   HGBA1C 5.9 05/04/2020   HGBA1C 6.2 01/24/2020   Lab Results  Component Value Date   MICROALBUR <0.7 09/26/2014   [place] 75 05/04/2020   CREATININE 1.21 02/22/2021  A1C is at goal.       Pain of right thumb - Primary    Suspect arthritis. Will obtain x-ray for further evaluation.       Relevant Orders   DG Hand Complete Right   Iron deficiency anemia    Lab Results  Component Value Date   WBC 6.5 12/11/2020  HGB 12.9 (L) 12/11/2020   HCT 38.7 (L) 12/11/2020   MCV 92.5 12/11/2020   PLT 148.0 (L) 12/11/2020  Stable.  Continue iron 373m bid.       Relevant Medications   folic acid (FOLVITE) 1 MG tablet   Internal hemorrhoids    States that the surgery office did not call him to schedule an appointment. Pt has been given office number to call.       Hyperlipidemia    Lab Results  Component Value Date   CHOL 151 05/04/2020   HDL 50.70 05/04/2020   LDLCALC 75 05/04/2020   TRIG 125.0 05/04/2020   CHOLHDL 3 05/04/2020  Due for recheck. Continue lipitor 1110mObtain follow up lipid panel.      Relevant Orders   Lipid panel   History of hypothyroidism    Lab Results  Component Value Date   TSH 1.47 01/18/2021  Stable on synthroid, continue same.       Relevant Medications   levothyroxine (SYNTHROID) 112 MCG tablet   History of gout    Stable. Continue allopurinol 10066m tabs daily.       Essential hypertension    BP Readings from Last 3 Encounters:  03/04/21 132/78  02/22/21 100/70  02/15/21 (!) 159/74  BP stable on lotensin.          Meds ordered this encounter  Medications   allopurinol (ZYLOPRIM) 100 MG tablet     Sig: Take 2 tablets (200 mg total) by mouth daily.    Dispense:  180 tablet    Refill:  1    Order Specific Question:   Supervising Provider    Answer:   BLYPenni Homans[[id number]]folic acid (FOLVITE) 1 MG tablet    Sig: Take 1 tablet (1 mg total) by mouth daily.    Dispense:  90 tablet    Refill:  1    Order Specific Question:   Supervising Provider    Answer:   BLYPenni Homans[[id number]]   levothyroxine (SYNTHROID) 112 MCG tablet    Sig: Take 1 tablet (112 mcg total) by mouth daily before breakfast.    Dispense:  90 tablet    Refill:  0    Order Specific Question:   Supervising Provider    Answer:   BLYPenni Homans[[id number]]    I, MelDebbrah Alar, personally preformed the services described in this documentation.  All medical record entries made by the scribe were at my direction and in my presence.  I have reviewed the chart and discharge instructions (if applicable) and agree that the record reflects my personal performance and is accurate and complete.  03/04/2021   I,Shehryar Baig,acting as a scrEducation administratorr MelNance PearP.,have documented all relevant documentation on the behalf of MelNance PearP,as directed by  MelNance PearP while in the presence of MelNance PearP.  MelNance PearP

## 2021-03-04 NOTE — Assessment & Plan Note (Signed)
Lab Results  Component Value Date   CHOL 151 05/04/2020   HDL 50.70 05/04/2020   LDLCALC 75 05/04/2020   TRIG 125.0 05/04/2020   CHOLHDL 3 05/04/2020   Due for recheck. Continue lipitor 10mg  Obtain follow up lipid panel.

## 2021-03-04 NOTE — Assessment & Plan Note (Signed)
Stable. Continue allopurinol 100mg  2 tabs daily.

## 2021-03-04 NOTE — Assessment & Plan Note (Signed)
BP Readings from Last 3 Encounters:  03/04/21 132/78  02/22/21 100/70  02/15/21 (!) 159/74   BP stable on lotensin.

## 2021-03-04 NOTE — Assessment & Plan Note (Signed)
Suspect arthritis. Will obtain x-ray for further evaluation.

## 2021-03-05 ENCOUNTER — Other Ambulatory Visit (INDEPENDENT_AMBULATORY_CARE_PROVIDER_SITE_OTHER): Payer: Medicare Other

## 2021-03-05 DIAGNOSIS — E7800 Pure hypercholesterolemia, unspecified: Secondary | ICD-10-CM

## 2021-03-05 LAB — LIPID PANEL
Cholesterol: 152 mg/dL (ref 0–200)
HDL: 44.1 mg/dL (ref >39.00)
LDL Cholesterol: 71 mg/dL (ref 0–99)
NonHDL: 108.29
Total CHOL/HDL Ratio: 3
Triglycerides: 188 mg/dL — ABNORMAL HIGH (ref 0.0–149.0)
VLDL: 37.6 mg/dL (ref 0.0–40.0)

## 2021-03-05 NOTE — Addendum Note (Signed)
Addended by: Manuela Schwartz on: 03/05/2021 12:06 PM   Modules accepted: Orders

## 2021-03-10 ENCOUNTER — Other Ambulatory Visit: Payer: Self-pay | Admitting: Family

## 2021-03-16 ENCOUNTER — Other Ambulatory Visit: Payer: Self-pay | Admitting: Physical Medicine & Rehabilitation

## 2021-04-04 ENCOUNTER — Other Ambulatory Visit: Payer: Self-pay

## 2021-04-04 ENCOUNTER — Encounter: Payer: Medicare Other | Attending: Physical Medicine & Rehabilitation | Admitting: Physical Medicine & Rehabilitation

## 2021-04-04 ENCOUNTER — Encounter: Payer: Self-pay | Admitting: Physical Medicine & Rehabilitation

## 2021-04-04 ENCOUNTER — Encounter (INDEPENDENT_AMBULATORY_CARE_PROVIDER_SITE_OTHER): Payer: Medicare Other | Admitting: Ophthalmology

## 2021-04-04 VITALS — BP 118/65 | HR 66 | Temp 98.4°F | Ht 75.0 in | Wt 252.0 lb

## 2021-04-04 DIAGNOSIS — G8112 Spastic hemiplegia affecting left dominant side: Secondary | ICD-10-CM | POA: Insufficient documentation

## 2021-04-04 NOTE — Patient Instructions (Signed)

## 2021-04-04 NOTE — Progress Notes (Signed)
Botox Injection for spasticity using needle EMG guidance   Dilution: 50 Units/ml Indication: Severe spasticity which interferes with ADL,mobility and/or  hygiene and is unresponsive to medication management and other conservative care Informed consent was obtained after describing risks and benefits of the procedure with the patient. This includes bleeding, bruising, infection, excessive weakness, or medication side effects. A REMS form is on file and signed. Needle: 27g 1" needle electrode Number of units per muscle    left FDP 25 units  left FDS 25 units left biceps 50 units [id number] All injections were done after obtaining appropriate EMG activity and after negative drawback for blood. The patient tolerated the procedure well. Post procedure instructions were given. A followup appointment was made.

## 2021-04-05 ENCOUNTER — Other Ambulatory Visit: Payer: Self-pay | Admitting: Family

## 2021-04-10 ENCOUNTER — Telehealth: Payer: Self-pay | Admitting: Family

## 2021-04-10 NOTE — Telephone Encounter (Signed)
I attempted to leave message for patient to call back and schedule Medicare Annual Wellness Visit (AWV) in office. No voice mail.  If not able to come in office, please offer to do virtually or by telephone.  Left office number and my jabber [phone].  Last AWV:05/21/2016  Please schedule at anytime with Nurse Health Advisor.

## 2021-04-11 ENCOUNTER — Encounter (INDEPENDENT_AMBULATORY_CARE_PROVIDER_SITE_OTHER): Payer: Self-pay | Admitting: Ophthalmology

## 2021-04-11 ENCOUNTER — Other Ambulatory Visit: Payer: Self-pay

## 2021-04-11 ENCOUNTER — Ambulatory Visit (INDEPENDENT_AMBULATORY_CARE_PROVIDER_SITE_OTHER): Payer: Medicare Other | Admitting: Ophthalmology

## 2021-04-11 DIAGNOSIS — H35371 Puckering of macula, right eye: Secondary | ICD-10-CM

## 2021-04-11 DIAGNOSIS — H35372 Puckering of macula, left eye: Secondary | ICD-10-CM | POA: Diagnosis not present

## 2021-04-11 DIAGNOSIS — E119 Type 2 diabetes mellitus without complications: Secondary | ICD-10-CM

## 2021-04-11 NOTE — Progress Notes (Signed)
04/11/2021     CHIEF COMPLAINT Patient presents for  Chief Complaint  Patient presents with   Retina Follow Up   No interval change in vision  Patient did have COVID illness and successful treatment IV antibodies    HISTORY OF PRESENT ILLNESS: [name] is a 76 y.o. male who presents to the clinic today for:   HPI     Retina Follow Up           Diagnosis: Other   Laterality: both eyes   Onset: 6 months ago   Severity: mild   Duration: 6 months   Course: stable         Comments   6 month fu ou and OCT- Macular Pucker OD  Pt states VA OU stable since last visit. Pt denies FOL, floaters, or ocular pain OU.  Pt denies any new issues with vision. Pt states, "everything seems to be great.'        Last edited by Kendra Opitz, COA on 04/11/2021  9:35 AM.      Referring physician: Debbrah Alar, NP [address] [place],  [place] [address]  HISTORICAL INFORMATION:   Selected notes from the MEDICAL RECORD NUMBER    Lab Results  Component Value Date   HGBA1C 5.6 01/18/2021     CURRENT MEDICATIONS: No current outpatient medications on file. (Ophthalmic Drugs)   No current facility-administered medications for this visit. (Ophthalmic Drugs)   Current Outpatient Medications (Other)  Medication Sig   allopurinol (ZYLOPRIM) 100 MG tablet Take 2 tablets (200 mg total) by mouth daily.   atorvastatin (LIPITOR) 40 MG tablet TAKE 1 TABLET BY MOUTH EVERY DAY   benazepril (LOTENSIN) 10 MG tablet Take 1 tablet (10 mg total) by mouth daily. Take with additional 72m tablet of benazepril daily   benazepril (LOTENSIN) 5 MG tablet TAKE 1 TABLET BY MOUTH DAILY WITH 10MG OF BENAZEPRIL TO EQUAL 15MG DAILY   blood glucose meter kit and supplies KIT Dispense based on patient and insurance preference. Use up to four times daily as directed. (FOR ICD-9 250.00, 250.01).   calcium carbonate (TUMS - DOSED IN MG ELEMENTAL CALCIUM) 500 MG chewable tablet  Chew 1 tablet by mouth daily as needed for indigestion or heartburn.   Continuous Blood Gluc Receiver (FREESTYLE LIBRE 2 READER) DEVI Use as directed   Continuous Blood Gluc Sensor (FREESTYLE LIBRE 14 DAY SENSOR) MISC USE AS DIRECTED   cyanocobalamin 1000 MCG tablet Take 2,000 mcg by mouth daily. Take 2 Gummies daily   diltiazem (CARDIZEM CD) 180 MG 24 hr capsule Take 1 capsule (180 mg total) by mouth daily.   dofetilide (TIKOSYN) 500 MCG capsule Take 1 capsule (500 mcg total) by mouth 2 (two) times daily.   ELIQUIS 5 MG TABS tablet TAKE 1 TABLET BY MOUTH TWICE A DAY   ferrous sulfate 325 (65 FE) MG tablet Take 1 tablet (325 mg total) by mouth 2 (two) times daily with a meal.   fluticasone (FLONASE) 50 MCG/ACT nasal spray Place 1 spray into both nostrils daily.   folic acid (FOLVITE) 1 MG tablet Take 1 tablet (1 mg total) by mouth daily.   furosemide (LASIX) 40 MG tablet TAKE 1 TABLET BY MOUTH EVERY DAY AS NEEDED   gabapentin (NEURONTIN) 100 MG capsule Take 1 capsule (100 mg total) by mouth at bedtime.   glucose blood (ACCU-CHEK GUIDE) test strip USE AS DIRECTED UP TO 4 TIMES DAILY   levothyroxine (SYNTHROID)  112 MCG tablet Take 1 tablet (112 mcg total) by mouth daily before breakfast.   metoprolol succinate (TOPROL-XL) 25 MG 24 hr tablet Take 1 tablet (25 mg total) by mouth daily.   Multiple Vitamins-Minerals (MULTIVITAMIN GUMMIES MENS PO) Take 2 tablets by mouth daily with breakfast.    nitroGLYCERIN (NITROSTAT) 0.4 MG SL tablet Place 1 tablet (0.4 mg total) under the tongue every 5 (five) minutes as needed for chest pain. Up to 3 doses   OXYGEN Inhale 2 L into the lungs at bedtime.   pantoprazole (PROTONIX) 40 MG tablet Take 1 tablet (40 mg total) by mouth daily before breakfast.   sennosides-docusate sodium (SENOKOT-S) 8.6-50 MG tablet Take 1 tablet by mouth as needed for constipation.   traMADol (ULTRAM) 50 MG tablet TAKE 1 TABLET BY MOUTH TWICE A DAY   traMADol (ULTRAM-ER) 200 MG 24 hr  tablet TAKE 1 TABLET BY MOUTH EVERY DAY   No current facility-administered medications for this visit. (Other)      REVIEW OF SYSTEMS:    ALLERGIES No Known Allergies  PAST MEDICAL HISTORY Past Medical History:  Diagnosis Date   Acute encephalopathy 12/12/2015   Acute respiratory failure with hypoxia and hypercapnia (HCC) 12/12/2015   Allergic rhinitis 02/13/2010   Qualifier: Diagnosis of  By: Wynona Luna    Angina at rest [facility]) 10/22/2015   Ataxia 08/10/2013   Atrial fibrillation ([place]) 11/2018   B12 deficiency 08/11/2013   BACK PAIN, LUMBAR 01/29/2009   Qualifier: Diagnosis of  By: Wynona Luna    BENIGN POSITIONAL VERTIGO 11/23/2009   Qualifier: Diagnosis of  By: Wynona Luna    Chronic diastolic heart failure ([place]) 04/24/2015   Chronic pain    left sided-[place]   Coronary 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 ([place]) 11/04/2015   s/p CABG 2004; CFX DES 2011   Essential hypertension 12/12/2006   Qualifier: Diagnosis of  By: [name] RMA, [place]     GERD 12/12/2006   Qualifier: Diagnosis of  By: Reatha Armour, Lucy     GOUT 12/12/2006   Qualifier: Diagnosis of  By: [name] RMA, [place]     Hemorrhoids    Hyperlipidemia LDL goal <70 12/12/2006   Qualifier: Diagnosis of  By: [name] RMA, [place]      Hypogonadism male 04/10/2011   Hypothyroidism, acquired 02/15/2013   Insomnia 05/21/2013   Long term current use of anticoagulant therapy 06/24/2011   Obstructive sleep apnea-Failed CPAP 06/17/2007   Failed CPAP Using O 2 for sleep    Paroxysmal atrial fibrillation ([place]) 12/06/2016   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   also 1996; resultant hemiplegia of dominant side   Thoracic aorta atherosclerosis ([place]) 04/27/2017   Tubular adenoma of colon 05/2010   Type 2 diabetes mellitus without complication, without long-term current use of insulin ([place]) 10/05/2007   Qualifier: Diagnosis of  By: Wynona Luna    Vasovagal syncope    Vasovagal syncope    Wears glasses     Past Surgical History:  Procedure Laterality Date   CARDIAC CATHETERIZATION N/A 10/25/2015   Procedure: Left Heart Cath and Cors/Grafts Angiography;  Surgeon: Troy Sine, MD;  Location: [facility];  Service: Cardiovascular;  Laterality: N/A;   CARDIOVERSION  06/20/2011   Procedure: CARDIOVERSION;  Surgeon: Larey Dresser, MD;  Location: [facility] ENDOSCOPY;  Service: Cardiovascular;  Laterality: N/A;   CARDIOVERSION N/A 04/26/2015   Procedure: CARDIOVERSION;  Surgeon: Dorothy Spark, MD;  Location: [facility];  Service: Cardiovascular;  Laterality: N/A;   CARDIOVERSION N/A 05/10/2015   Procedure: CARDIOVERSION;  Surgeon: Larey Dresser, MD;  Location: [place];  Service: Cardiovascular;  Laterality: N/A;   CARDIOVERSION N/A 12/10/2018   Procedure: CARDIOVERSION;  Surgeon: Sanda Klein, MD;  Location: [facility];  Service: Cardiovascular;  Laterality: N/A;   CATARACT EXTRACTION  05/2010   left eye   CATARACT EXTRACTION  04/2010   right eye   COLONOSCOPY WITH PROPOFOL N/A 02/22/2020   Procedure: COLONOSCOPY WITH PROPOFOL;  Surgeon: Lavena Bullion, DO;  Location: [facility] ENDOSCOPY;  Service: Gastroenterology;  Laterality: N/A;   CORONARY ARTERY BYPASS GRAFT     stent   ESOPHAGOGASTRODUODENOSCOPY  03-25-2005   LEFT HEART CATH AND CORS/GRAFTS ANGIOGRAPHY N/A 02/19/2018   Procedure: LEFT HEART CATH AND CORS/GRAFTS ANGIOGRAPHY;  Surgeon: Martinique, Peter M, MD;  Location: [facility];  Service: Cardiovascular;  Laterality: N/A;   NASAL SEPTUM SURGERY     PERCUTANEOUS PLACEMENT INTRAVASCULAR STENT CERVICAL CAROTID ARTERY     03-2009; using a drug-eluting platform of the circumflex cornoray artery with a 3.0 x 18 [employer] Promus drug-eluting platform post dilated to 3.75 with a noncompliant balloon.   POLYPECTOMY  02/22/2020   Procedure: POLYPECTOMY;  Surgeon: Lavena Bullion, DO;  Location: [facility] ENDOSCOPY;  Service: Gastroenterology;;   TEE WITHOUT CARDIOVERSION  06/20/2011    Procedure: TRANSESOPHAGEAL ECHOCARDIOGRAM (TEE);  Surgeon: Larey Dresser, MD;  Location: [facility] ENDOSCOPY;  Service: Cardiovascular;  Laterality: N/A;    FAMILY HISTORY Family History  Problem Relation Age of Onset   Lung cancer Father        deceased   Stroke Mother        deceased-MINISTROKES   Colon cancer Neg Hx    Esophageal cancer Neg Hx    Stomach cancer Neg Hx    Pancreatic cancer Neg Hx    Liver disease Neg Hx     SOCIAL HISTORY Social History   Tobacco Use   Smoking status: Never   Smokeless tobacco: Never  Vaping Use   Vaping Use: Never used  Substance Use Topics   Alcohol use: Not Currently   Drug use: No         OPHTHALMIC EXAM:  Base Eye Exam     Visual Acuity (ETDRS)       Right Left   Dist cc 20/20 -2 20/20 -2    Correction: Glasses         Tonometry (Tonopen, 9:39 AM)       Right Left   Pressure 14 13         Pupils       Pupils Dark Light Shape React APD   Right PERRL 3 2 Round Brisk None   Left PERRL 3 2 Round Brisk None         Visual Fields       Left Right    Full Full         Extraocular Movement       Right Left    Full, Ortho Full, Ortho         Neuro/Psych     Oriented x3: Yes         Dilation     Both eyes: 1.0% Mydriacyl, 2.5% Phenylephrine @ 9:39 AM           Slit Lamp and Fundus Exam     External Exam       Right Left  External Normal Normal         Slit Lamp Exam       Right Left   Lids/Lashes Normal Normal   Conjunctiva/Sclera White and quiet White and quiet   Cornea Clear Clear   Anterior Chamber Deep and quiet Deep and quiet   Iris Round and reactive Round and reactive   Lens Centered posterior chamber intraocular lens Centered posterior chamber intraocular lens   Anterior Vitreous Normal Normal         Fundus Exam       Right Left   Posterior Vitreous Posterior vitreous detachment Posterior vitreous detachment   Disc Normal Normal   C/D Ratio 0.55 0.5    Macula Epiretinal membrane, moderate to severe topographic distortion Normal   Vessels Normal Normal   Periphery Normal Normal            IMAGING AND PROCEDURES  Imaging and Procedures for 04/11/21  OCT, Retina - OU - Both Eyes       Right Eye Quality was good. Scan locations included subfoveal. Central Foveal Thickness: 396. Progression has no prior data. Findings include abnormal foveal contour, epiretinal membrane.   Left Eye Quality was good. Central Foveal Thickness: 277. Progression has no prior data. Findings include normal foveal contour, epiretinal membrane.   Notes Macular thickening right eye with epiretinal membrane giving a pseudocyst of the inner retina and some early inner foveal macular schisis temporally. No interval changes occurred in the right eye as compared to May 2022 OS with minor epiretinal membrane none foveal superonasal, no impact on macular anatomy                ASSESSMENT/PLAN:  Macular pucker, right eye Moderate to severe epiretinal membrane with slight progression of macular thickening over the last 1 year.  Still with good acuity however.  No outer retinal changes thus we will continue to observe  Macular pucker, left eye No topographic distortion no impact on acuity, stable year-over-year 2023  Type 2 diabetes mellitus without complication, without long-term current use of insulin (HCC) Continue blood sugar control  The patient has diabetes without any evidence of retinopathy. The patient advised to maintain good blood glucose control, excellent blood pressure control, and favorable levels of cholesterol, low density lipoprotein, and high density lipoproteins. Follow up in 1 year was recommended. Explained that fluctuations in visual acuity , or "out of focus", may result from large variations of blood sugar control.      ICD-10-CM   1. Macular pucker, right eye  H35.371 OCT, Retina - OU - Both Eyes    2. Macular pucker, left  eye  H35.372     3. Type 2 diabetes mellitus without complication, without long-term current use of insulin (HCC)  E11.9       1.  OD, increased thickening of the macula from macular pucker yet no impact on acuity will continue to observe closely  2.  OS minor ERM  3.  Ophthalmic Meds Ordered this visit:  No orders of the defined types were placed in this encounter.      Return in about 7 months (around 11/09/2021) for DILATE OU, OCT.  There are no Patient Instructions on file for this visit.   Explained the diagnoses, plan, and follow up with the patient and they expressed understanding.  Patient expressed understanding of the importance of proper follow up care.   Clent Demark Savannaha Stonerock M.D. Diseases & Surgery of the Retina and Vitreous Retina & Diabetic  [place] 04/11/21     Abbreviations: M myopia (nearsighted); A astigmatism; H hyperopia (farsighted); P presbyopia; Mrx spectacle prescription;  CTL contact lenses; OD right eye; OS left eye; OU both eyes  XT exotropia; ET esotropia; PEK punctate epithelial keratitis; PEE punctate epithelial erosions; DES dry eye syndrome; MGD meibomian gland dysfunction; ATs artificial tears; PFAT's preservative free artificial tears; [place] nuclear sclerotic cataract; PSC posterior subcapsular cataract; ERM epi-retinal membrane; PVD posterior vitreous detachment; RD retinal detachment; DM diabetes mellitus; DR diabetic retinopathy; NPDR non-proliferative diabetic retinopathy; PDR proliferative diabetic retinopathy; CSME clinically significant macular edema; DME diabetic macular edema; dbh dot blot hemorrhages; CWS cotton wool spot; POAG primary open angle glaucoma; C/D cup-to-disc ratio; HVF humphrey visual field; GVF goldmann visual field; OCT optical coherence tomography; IOP intraocular pressure; BRVO Branch retinal vein occlusion; CRVO central retinal vein occlusion; CRAO central retinal artery occlusion; BRAO branch retinal artery occlusion; RT  retinal tear; SB scleral buckle; PPV pars plana vitrectomy; VH Vitreous hemorrhage; PRP panretinal laser photocoagulation; IVK intravitreal kenalog; VMT vitreomacular traction; MH Macular hole;  NVD neovascularization of the disc; NVE neovascularization elsewhere; AREDS age related eye disease study; ARMD age related macular degeneration; POAG primary open angle glaucoma; EBMD epithelial/anterior basement membrane dystrophy; ACIOL anterior chamber intraocular lens; IOL intraocular lens; PCIOL posterior chamber intraocular lens; Phaco/IOL phacoemulsification with intraocular lens placement; [place] photorefractive keratectomy; LASIK laser assisted in situ keratomileusis; HTN hypertension; DM diabetes mellitus; COPD chronic obstructive pulmonary disease

## 2021-04-11 NOTE — Assessment & Plan Note (Signed)
Moderate to severe epiretinal membrane with slight progression of macular thickening over the last 1 year.  Still with good acuity however.  No outer retinal changes thus we will continue to observe

## 2021-04-11 NOTE — Assessment & Plan Note (Signed)
No topographic distortion no impact on acuity, stable year-over-year 2023

## 2021-04-11 NOTE — Assessment & Plan Note (Signed)
Continue blood sugar control  The patient has diabetes without any evidence of retinopathy. The patient advised to maintain good blood glucose control, excellent blood pressure control, and favorable levels of cholesterol, low density lipoprotein, and high density lipoproteins. Follow up in 1 year was recommended. Explained that fluctuations in visual acuity , or "out of focus", may result from large variations of blood sugar control.

## 2021-04-13 ENCOUNTER — Other Ambulatory Visit: Payer: Self-pay | Admitting: Family

## 2021-04-15 ENCOUNTER — Other Ambulatory Visit: Payer: Self-pay | Admitting: Family

## 2021-04-29 ENCOUNTER — Other Ambulatory Visit: Payer: Self-pay | Admitting: Family

## 2021-04-29 NOTE — Telephone Encounter (Signed)
Pharmacy requesting 90 day rx.  Rx is prn.

## 2021-05-07 ENCOUNTER — Other Ambulatory Visit: Payer: Self-pay | Admitting: Family

## 2021-05-07 ENCOUNTER — Other Ambulatory Visit: Payer: Self-pay | Admitting: Physician Assistant

## 2021-05-07 DIAGNOSIS — Z8639 Personal history of other endocrine, nutritional and metabolic disease: Secondary | ICD-10-CM

## 2021-05-22 ENCOUNTER — Telehealth: Payer: Self-pay | Admitting: Family

## 2021-05-22 NOTE — Telephone Encounter (Signed)
I attempted to leave message for patient to call back and schedule Medicare Annual Wellness Visit (AWV) in office. Patient's phone wouldn't accept my call. ? ?If not able to come in office, please offer to do virtually or by telephone.  Left office number and my jabber [phone]. ? ?Last AWV:05/21/2016 ? ?Please schedule at anytime with Nurse Health Advisor. ?  ?

## 2021-05-30 DIAGNOSIS — M79644 Pain in right finger(s): Secondary | ICD-10-CM | POA: Diagnosis not present

## 2021-05-30 DIAGNOSIS — M79641 Pain in right hand: Secondary | ICD-10-CM | POA: Diagnosis not present

## 2021-05-30 DIAGNOSIS — G8929 Other chronic pain: Secondary | ICD-10-CM | POA: Diagnosis not present

## 2021-06-03 ENCOUNTER — Ambulatory Visit: Payer: Medicare Other | Admitting: Family

## 2021-06-06 DIAGNOSIS — G5601 Carpal tunnel syndrome, right upper limb: Secondary | ICD-10-CM | POA: Diagnosis not present

## 2021-06-06 DIAGNOSIS — G5603 Carpal tunnel syndrome, bilateral upper limbs: Secondary | ICD-10-CM | POA: Diagnosis not present

## 2021-06-06 DIAGNOSIS — M79641 Pain in right hand: Secondary | ICD-10-CM | POA: Diagnosis not present

## 2021-06-06 DIAGNOSIS — G8929 Other chronic pain: Secondary | ICD-10-CM | POA: Diagnosis not present

## 2021-06-06 DIAGNOSIS — M79644 Pain in right finger(s): Secondary | ICD-10-CM | POA: Diagnosis not present

## 2021-06-10 ENCOUNTER — Encounter: Payer: Self-pay | Admitting: *Deleted

## 2021-06-12 ENCOUNTER — Other Ambulatory Visit: Payer: Self-pay | Admitting: Family

## 2021-06-12 ENCOUNTER — Other Ambulatory Visit: Payer: Self-pay | Admitting: Cardiology

## 2021-06-12 DIAGNOSIS — Z8639 Personal history of other endocrine, nutritional and metabolic disease: Secondary | ICD-10-CM

## 2021-06-12 NOTE — Telephone Encounter (Signed)
Prescription refill request for Eliquis received. ? ?Indication: afib  ?Last office visit: 02/22/2021, [place] ?Scr: 1.21, 02/22/2021 ?Age: 76 yo  ?Weight: 114.3 kg  ? ?Refill sent.  ?

## 2021-06-16 ENCOUNTER — Other Ambulatory Visit: Payer: Self-pay | Admitting: Family

## 2021-06-17 ENCOUNTER — Telehealth: Payer: Self-pay | Admitting: Cardiology

## 2021-06-17 NOTE — Telephone Encounter (Signed)
I called the pt first and s/w the pt's wife. She tells me that the pt is on his way up to our office about his clearance. I informed the pt's wife that we never received a clearance request from the surgeon's office. I explained to her there is nothing I can do until  get the information from the surgeon's office. I stated she should have him return home as there is nothing to be done at this point on our end as we do not have a clearance request. I stated that I will call the surgeon's office to inquire about the clearance request.  ? ?I s/w Rhonda with Dr. Georgia Dom office ph# [phone]; fax# [phone]. Per Suanne Marker, Dr. Donne Hazel does not need clearance from cardiology as the pt is having local anesthesia. I asked though about the Eliquis. Per Suanne Marker, she states that she has told the pt already that he does not need to hold Eliquis. Suanne Marker, states she will call the pt and explain this again to him and his wife.  ? ?I thanked Suanne Marker for her and her time in this matter. I informed Suanne Marker, I will make a note on our end here and fax this to her for the chart in their office as well.   ?

## 2021-06-17 NOTE — Telephone Encounter (Signed)
Called pt in regards to holding Eliquis for upcoming carpal tunnel procedure.  No record of Cardiac Evaluation noted in chart.  Asked pt if this paperwork was sent over.  Pt reports no all he needs is to know how long to hold Eliquis.  I advised pt that our pre-op team reviews chart and makes recommendations.  Pt reports is having surgery on Wednesday and needs information today.  I again advised pt that I would send message to our pre-op team to evaluate.   ?

## 2021-06-17 NOTE — Telephone Encounter (Signed)
Patient called stating he is having a carpal tunnel surgery done on Wednesday. He wants to when he should stop taking Eliquis. A detailed message can be left on his VM.  ?

## 2021-06-17 NOTE — Telephone Encounter (Addendum)
I apologize Dr. Donne Hazel. I saw your note in the chart. I apologize. Thank you the update. Have a great day sir.  ? ? ?Pt came by the office and requested to s/w me about his surgery clearance. I s/w the pt and explained that I sw/ Rhonda at the surgeon's office; see previous notes from earlier today.  ?

## 2021-06-19 DIAGNOSIS — G5601 Carpal tunnel syndrome, right upper limb: Secondary | ICD-10-CM | POA: Diagnosis not present

## 2021-07-04 ENCOUNTER — Encounter: Payer: Medicare Other | Attending: Physical Medicine & Rehabilitation | Admitting: Physical Medicine & Rehabilitation

## 2021-07-04 DIAGNOSIS — G8112 Spastic hemiplegia affecting left dominant side: Secondary | ICD-10-CM | POA: Insufficient documentation

## 2021-07-18 ENCOUNTER — Other Ambulatory Visit: Payer: Self-pay | Admitting: Family

## 2021-07-30 ENCOUNTER — Encounter: Payer: Self-pay | Admitting: Physical Medicine & Rehabilitation

## 2021-07-30 ENCOUNTER — Encounter: Payer: Medicare Other | Attending: Physical Medicine & Rehabilitation | Admitting: Physical Medicine & Rehabilitation

## 2021-07-30 VITALS — BP 115/68 | HR 61 | Ht 75.0 in | Wt 249.8 lb

## 2021-07-30 DIAGNOSIS — G8112 Spastic hemiplegia affecting left dominant side: Secondary | ICD-10-CM | POA: Diagnosis not present

## 2021-07-30 NOTE — Progress Notes (Signed)
Botox Injection for spasticity using needle EMG guidance ?  ?Dilution: 50 Units/ml ?Indication: Severe spasticity which interferes with ADL,mobility and/or  hygiene and is unresponsive to medication management and other conservative care ?Informed consent was obtained after describing risks and benefits of the procedure with the patient. This includes bleeding, bruising, infection, excessive weakness, or medication side effects. A REMS form is on file and signed. ?Needle: 27g 1" needle electrode ?Number of units per muscle ?  ? left FDP 25 units ? left FDS 25 units ?left biceps 50 units ?Hamstrings200 ?All injections were done after obtaining appropriate EMG activity and after negative drawback for blood. The patient tolerated the procedure well. Post procedure instructions were given. A followup appointment was made.     ?

## 2021-07-30 NOTE — Patient Instructions (Signed)

## 2021-08-01 DIAGNOSIS — M180 Bilateral primary osteoarthritis of first carpometacarpal joints: Secondary | ICD-10-CM | POA: Diagnosis not present

## 2021-08-05 ENCOUNTER — Ambulatory Visit: Payer: Medicare Other | Admitting: Physician Assistant

## 2021-08-08 ENCOUNTER — Ambulatory Visit: Payer: Medicare Other | Admitting: Physician Assistant

## 2021-08-14 ENCOUNTER — Ambulatory Visit: Payer: Self-pay | Admitting: Surgery

## 2021-08-14 DIAGNOSIS — K641 Second degree hemorrhoids: Secondary | ICD-10-CM | POA: Diagnosis present

## 2021-08-14 DIAGNOSIS — Z7901 Long term (current) use of anticoagulants: Secondary | ICD-10-CM | POA: Diagnosis not present

## 2021-08-14 DIAGNOSIS — I69052 Hemiplegia and hemiparesis following nontraumatic subarachnoid hemorrhage affecting left dominant side: Secondary | ICD-10-CM | POA: Diagnosis not present

## 2021-08-14 DIAGNOSIS — K644 Residual hemorrhoidal skin tags: Secondary | ICD-10-CM | POA: Diagnosis present

## 2021-08-15 ENCOUNTER — Telehealth: Payer: Self-pay | Admitting: *Deleted

## 2021-08-15 NOTE — Telephone Encounter (Signed)
   Name: Ashyr Hedgepath  DOB: Nov 22, 1945  MRN: [id number]  Primary Cardiologist: Candee Furbish, MD  Chart reviewed as part of pre-operative protocol coverage. Because of Jordin Vicencio past medical history and time since last visit, he will require a follow-up in-office visit in order to better assess preoperative cardiovascular risk.  Pre-op covering staff: - Please schedule appointment and call patient to inform them. If patient already had an upcoming appointment within acceptable timeframe, please add "pre-op clearance" to the appointment notes so provider is aware. - Please contact requesting surgeon's office via preferred method (i.e, phone, fax) to inform them of need for appointment prior to surgery.  Patient has been seen multiple times by EP, however has not been seen by general cardiology team Dr. Marlou Porch office since 2021.  [place], [place]  08/15/2021, 11:45 PM

## 2021-08-15 NOTE — Telephone Encounter (Signed)
Clinical pharmacist to review Eliquis 

## 2021-08-15 NOTE — Telephone Encounter (Signed)
   Pre-operative Risk Assessment    Patient Name: [name]  DOB: 06-14-1945 MRN: [id number]      Request for Surgical Clearance    Procedure:   HEMORRHOIDECTOMY  Date of Surgery:  Clearance TBD  (PT INTERESTED IN HAVING SURGERY ASAP)                               Surgeon:  DR. Michael Boston Surgeon's Group or Practice Name:  [place] Phone number:  [phone] Fax number:  [phone] ATTN: Illene Regulus, CMA   Type of Clearance Requested:   - Medical  - Pharmacy:  Hold Apixaban (Eliquis)     Type of Anesthesia:  General    Additional requests/questions:    Jiles Prows   08/15/2021, 9:51 AM

## 2021-08-16 NOTE — Telephone Encounter (Signed)
Call placed to pt regarding surgical clearance and the need for an in-office appointment, spoke with wife.  She didn't want to make an appointment due to pt's schedule.  She was advised to have pt call back to make the 1st available appointment.

## 2021-08-16 NOTE — Telephone Encounter (Signed)
Patient with diagnosis of afib on Eliquis for anticoagulation.    Procedure: hemorrhoidectomy Date of procedure: TBD  CHA2DS2-VASc Score = 8  This indicates a 10.8% annual risk of stroke. The patient's score is based upon: CHF History: 1 HTN History: 1 Diabetes History: 1 Stroke History: 2 Vascular Disease History: 1 Age Score: 2 Gender Score: 0   CrCl 5m/min using adjusted body weight Platelet count 148K  Per office protocol, patient can hold Eliquis for 1 day prior to procedure. He should resume as soon as safely possible after given his elevated CV risk.

## 2021-08-19 ENCOUNTER — Encounter: Payer: Self-pay | Admitting: Physician Assistant

## 2021-08-19 ENCOUNTER — Ambulatory Visit (INDEPENDENT_AMBULATORY_CARE_PROVIDER_SITE_OTHER): Payer: Medicare Other | Admitting: Physician Assistant

## 2021-08-19 VITALS — BP 116/74 | HR 61 | Ht 75.0 in | Wt 242.4 lb

## 2021-08-19 DIAGNOSIS — I5032 Chronic diastolic (congestive) heart failure: Secondary | ICD-10-CM | POA: Diagnosis not present

## 2021-08-19 DIAGNOSIS — I2510 Atherosclerotic heart disease of native coronary artery without angina pectoris: Secondary | ICD-10-CM

## 2021-08-19 DIAGNOSIS — D6869 Other thrombophilia: Secondary | ICD-10-CM | POA: Diagnosis not present

## 2021-08-19 DIAGNOSIS — I10 Essential (primary) hypertension: Secondary | ICD-10-CM | POA: Diagnosis not present

## 2021-08-19 DIAGNOSIS — Z951 Presence of aortocoronary bypass graft: Secondary | ICD-10-CM | POA: Diagnosis not present

## 2021-08-19 DIAGNOSIS — Z01810 Encounter for preprocedural cardiovascular examination: Secondary | ICD-10-CM | POA: Diagnosis not present

## 2021-08-19 DIAGNOSIS — Z9861 Coronary angioplasty status: Secondary | ICD-10-CM | POA: Diagnosis not present

## 2021-08-19 DIAGNOSIS — E7800 Pure hypercholesterolemia, unspecified: Secondary | ICD-10-CM | POA: Diagnosis not present

## 2021-08-19 NOTE — Patient Instructions (Signed)
Medication Instructions:  No Changes *If you need a refill on your cardiac medications before your next appointment, please call your pharmacy*   Lab Work: No Labs If you have labs (blood work) drawn today and your tests are completely normal, you will receive your results only by: [place] (if you have MyChart) OR A paper copy in the mail If you have any lab test that is abnormal or we need to change your treatment, we will call you to review the results.   Testing/Procedures: No Testing   Follow-Up: At [facility], you and your health needs are our priority.  As part of our continuing mission to provide you with exceptional heart care, we have created designated Provider Care Teams.  These Care Teams include your primary Cardiologist (physician) and Advanced Practice Providers (APPs -  Physician Assistants and Nurse Practitioners) who all work together to provide you with the care you need, when you need it.  We recommend signing up for the patient portal called "MyChart".  Sign up information is provided on this After Visit Summary.  MyChart is used to connect with patients for Virtual Visits (Telemedicine).  Patients are able to view lab/test results, encounter notes, upcoming appointments, etc.  Non-urgent messages can be sent to your provider as well.   To learn more about what you can do with MyChart, go to [email or web address].    Your next appointment:   6 month(s)  The format for your next appointment:   In Person  Provider:   Candee Furbish, MD       Important Information About Sugar

## 2021-08-19 NOTE — Telephone Encounter (Signed)
Pt has appt 08/19/21 with Fabian Sharp, PAC

## 2021-08-19 NOTE — Progress Notes (Signed)
Cardiology Office Note:    Date:  08/19/2021   ID:  [name], DOB 1945/11/08, MRN [id number]  PCP:  Debbrah Alar, NP   [facility] Providers Cardiologist:  Candee Furbish, MD Electrophysiologist:  Virl Axe, MD     Referring MD: Debbrah Alar, NP   Chief Complaint  Patient presents with   Pre-op Exam    CABG    History of Present Illness:    [name] is a 76 y.o. male with a hx of CAD s/p CABG, chronic diastolic heart failure, hypertension, hyperlipidemia, PAF, OSA failed CPAP, CVA, and hx of amiodarone lung toxicity. Last heart cath 2019 with 3 out of 4 patent grafts: Chronic occlusion of the SVG-LCx.   He has followed with the Afib clinic with prior cardioversions and hx of amiodarone lung toxicity.  1 cardioversion was complicated by significant chest wall bleeding and hematoma.   He was loaded with dofetilide 5/18-5/21/21 and converted on this medication. He is anticoagulated with eliquis.   He was seen by [facility] 05/2020 for foot pain felt related to neuropathy, no evidence of PAD.   He presents to the office today for preoperative risk evaluation for hemorrhoidectomy. He denies chest pain. He walks 500 ft, uses a walker very seldomly. He reports he can climb stairs and did so 2-3 weeks ago. He works in his garage He does moderate housework at home (vacuuming). [place] also fixing lawn mower. He just replaced the tread on his deck stairs. He denies angina or symptoms of heart failure.   Past Medical History:  Diagnosis Date   Acute encephalopathy 12/12/2015   Acute respiratory failure with hypoxia and hypercapnia (HCC) 12/12/2015   Allergic rhinitis 02/13/2010   Qualifier: Diagnosis of  By: Wynona Luna    Angina at rest [facility]) 10/22/2015   Ataxia 08/10/2013   Atrial fibrillation ([place]) 11/2018   B12 deficiency 08/11/2013   BACK PAIN, LUMBAR 01/29/2009   Qualifier: Diagnosis of  By: Wynona Luna    BENIGN POSITIONAL VERTIGO 11/23/2009    Qualifier: Diagnosis of  By: Wynona Luna    Chronic diastolic heart failure ([place]) 04/24/2015   Chronic pain    left sided-[place]   Coronary 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 ([place]) 11/04/2015   s/p CABG 2004; CFX DES 2011   Essential hypertension 12/12/2006   Qualifier: Diagnosis of  By: [name] RMA, [place]     GERD 12/12/2006   Qualifier: Diagnosis of  By: Reatha Armour, [place]     GOUT 12/12/2006   Qualifier: Diagnosis of  By: [name] RMA, Lucy     Hemorrhoids    Hyperlipidemia LDL goal <70 12/12/2006   Qualifier: Diagnosis of  By: [name] RMA, [place]      Hypogonadism male 04/10/2011   Hypothyroidism, acquired 02/15/2013   Insomnia 05/21/2013   Long term current use of anticoagulant therapy 06/24/2011   Obstructive sleep apnea-Failed CPAP 06/17/2007   Failed CPAP Using O 2 for sleep    Paroxysmal atrial fibrillation ([place]) 12/06/2016   Stroke (cerebrum) ([place]) 01/13/2017   also 1996; resultant hemiplegia of dominant side   Thoracic aorta atherosclerosis ([place]) 04/27/2017   Tubular adenoma of colon 05/2010   Type 2 diabetes mellitus without complication, without long-term current use of insulin ([place]) 10/05/2007   Qualifier: Diagnosis of  By: Wynona Luna    Vasovagal syncope    Vasovagal syncope    Wears glasses  Past Surgical History:  Procedure Laterality Date   CARDIAC CATHETERIZATION N/A 10/25/2015   Procedure: Left Heart Cath and Cors/Grafts Angiography;  Surgeon: Troy Sine, MD;  Location: [place] CV LAB;  Service: Cardiovascular;  Laterality: N/A;   CARDIOVERSION  06/20/2011   Procedure: CARDIOVERSION;  Surgeon: Larey Dresser, MD;  Location: [place];  Service: Cardiovascular;  Laterality: N/A;   CARDIOVERSION N/A 04/26/2015   Procedure: CARDIOVERSION;  Surgeon: Dorothy Spark, MD;  Location: [facility] ENDOSCOPY;  Service: Cardiovascular;  Laterality: N/A;   CARDIOVERSION N/A 05/10/2015   Procedure: CARDIOVERSION;  Surgeon: Larey Dresser, MD;  Location:  [place];  Service: Cardiovascular;  Laterality: N/A;   CARDIOVERSION N/A 12/10/2018   Procedure: CARDIOVERSION;  Surgeon: Sanda Klein, MD;  Location: [place];  Service: Cardiovascular;  Laterality: N/A;   CATARACT EXTRACTION  05/2010   left eye   CATARACT EXTRACTION  04/2010   right eye   COLONOSCOPY WITH PROPOFOL N/A 02/22/2020   Procedure: COLONOSCOPY WITH PROPOFOL;  Surgeon: Lavena Bullion, DO;  Location: [facility] ENDOSCOPY;  Service: Gastroenterology;  Laterality: N/A;   CORONARY ARTERY BYPASS GRAFT     stent   ESOPHAGOGASTRODUODENOSCOPY  03-25-2005   LEFT HEART CATH AND CORS/GRAFTS ANGIOGRAPHY N/A 02/19/2018   Procedure: LEFT HEART CATH AND CORS/GRAFTS ANGIOGRAPHY;  Surgeon: Martinique, Peter M, MD;  Location: [place] CV LAB;  Service: Cardiovascular;  Laterality: N/A;   NASAL SEPTUM SURGERY     PERCUTANEOUS PLACEMENT INTRAVASCULAR STENT CERVICAL CAROTID ARTERY     03-2009; using a drug-eluting platform of the circumflex cornoray artery with a 3.0 x 18 [employer] Promus drug-eluting platform post dilated to 3.75 with a noncompliant balloon.   POLYPECTOMY  02/22/2020   Procedure: POLYPECTOMY;  Surgeon: Lavena Bullion, DO;  Location: [facility] ENDOSCOPY;  Service: Gastroenterology;;   TEE WITHOUT CARDIOVERSION  06/20/2011   Procedure: TRANSESOPHAGEAL ECHOCARDIOGRAM (TEE);  Surgeon: Larey Dresser, MD;  Location: [facility] ENDOSCOPY;  Service: Cardiovascular;  Laterality: N/A;    Current Medications: Current Meds  Medication Sig   allopurinol (ZYLOPRIM) 100 MG tablet Take 2 tablets (200 mg total) by mouth daily.   atorvastatin (LIPITOR) 40 MG tablet TAKE 1 TABLET BY MOUTH EVERY DAY   benazepril (LOTENSIN) 10 MG tablet TAKE 1 TABLET (10 MG TOTAL) BY MOUTH DAILY. TAKE WITH ADDITIONAL 5MG TABLET OF BENAZEPRIL DAILY   benazepril (LOTENSIN) 5 MG tablet TAKE 1 TABLET BY MOUTH DAILY WITH 10MG OF BENAZEPRIL TO EQUAL 15MG DAILY   blood glucose meter kit and supplies KIT Dispense based on  patient and insurance preference. Use up to four times daily as directed. (FOR ICD-9 250.00, 250.01).   calcium carbonate (TUMS - DOSED IN MG ELEMENTAL CALCIUM) 500 MG chewable tablet Chew 1 tablet by mouth daily as needed for indigestion or heartburn.   Continuous Blood Gluc Receiver (FREESTYLE LIBRE 2 READER) DEVI Use as directed   Continuous Blood Gluc Sensor (FREESTYLE LIBRE 14 DAY SENSOR) MISC USE AS DIRECTED   cyanocobalamin 1000 MCG tablet Take 2,000 mcg by mouth daily. Take 2 Gummies daily   diltiazem (CARDIZEM CD) 180 MG 24 hr capsule Take 1 capsule (180 mg total) by mouth daily.   dofetilide (TIKOSYN) 500 MCG capsule TAKE 1 CAPSULE BY MOUTH 2 TIMES DAILY.   ELIQUIS 5 MG TABS tablet TAKE 1 TABLET BY MOUTH TWICE A DAY   ferrous sulfate 325 (65 FE) MG tablet Take 1 tablet (325 mg total) by mouth 2 (two) times daily with a meal.  fluticasone (FLONASE) 50 MCG/ACT nasal spray Place 1 spray into both nostrils daily.   folic acid (FOLVITE) 1 MG tablet Take 1 tablet (1 mg total) by mouth daily.   furosemide (LASIX) 40 MG tablet TAKE 1 TABLET BY MOUTH EVERY DAY AS NEEDED   gabapentin (NEURONTIN) 100 MG capsule TAKE 1 CAPSULE BY MOUTH AT BEDTIME.   glucose blood (ACCU-CHEK GUIDE) test strip USE AS DIRECTED UP TO 4 TIMES DAILY   levothyroxine (SYNTHROID) 112 MCG tablet TAKE 1 TABLET DAILY BEFORE BREAKFAST. NEEDS OFFICE VISIT BEFORE ANY FURTHER REFILLS   metoprolol succinate (TOPROL-XL) 25 MG 24 hr tablet Take 1 tablet (25 mg total) by mouth daily.   Multiple Vitamins-Minerals (MULTIVITAMIN GUMMIES MENS PO) Take 2 tablets by mouth daily with breakfast.    nitroGLYCERIN (NITROSTAT) 0.4 MG SL tablet Place 1 tablet (0.4 mg total) under the tongue every 5 (five) minutes as needed for chest pain. Up to 3 doses   OXYGEN Inhale 2 L into the lungs at bedtime.   pantoprazole (PROTONIX) 40 MG tablet TAKE 1 TABLET BY MOUTH EVERY DAY BEFORE BREAKFAST   sennosides-docusate sodium (SENOKOT-S) 8.6-50 MG tablet  Take 1 tablet by mouth as needed for constipation.   traMADol (ULTRAM) 50 MG tablet TAKE 1 TABLET BY MOUTH TWICE A DAY   traMADol (ULTRAM-ER) 200 MG 24 hr tablet TAKE 1 TABLET BY MOUTH EVERY DAY     Allergies:   Patient has no known allergies.   Social History   Socioeconomic History   Marital status: Married    Spouse name: [name]   Number of children: Not on file   Years of education: 14   Highest education level: Not on file  Occupational History   Occupation: retired    Fish farm manager: OTHER    Comment: Mining engineer  Tobacco Use   Smoking status: Never   Smokeless tobacco: Never  Vaping Use   Vaping Use: Never used  Substance and Sexual Activity   Alcohol use: Not Currently   Drug use: No   Sexual activity: Not Currently  Other Topics Concern   Not on file  Social History Narrative   Pt lives with wife. Does have stairs, but patient doesn't use them. Pt has completed technical school   Social Determinants of Health   Financial Resource Strain: Not on file  Food Insecurity: Not on file  Transportation Needs: Not on file  Physical Activity: Not on file  Stress: Not on file  Social Connections: Not on file     Family History: The patient's family history includes Lung cancer in his father; Stroke in his mother. There is no history of Colon cancer, Esophageal cancer, Stomach cancer, Pancreatic cancer, or Liver disease.  ROS:   Please see the history of present illness.     All other systems reviewed and are negative.  EKGs/Labs/Other Studies Reviewed:    The following studies were reviewed today:  Heart cath 2019 1. Left dominant circulation 2. Severe 3 vessel occlusive CAD.    - 95% ostial LAD    - 100% first diagonal    - 100% ostial LCx. This is new since 2017 - was 95% prior    - 100% nondominant RCA 3. Patent LIMA to the LAD. There is chronic severe disease in the apical LAD 4. Patent SVG to the first diagonal and SVG to the first OM as a Y graft. 5.  Chronic occlusion of the SVG to terminal LCx 6. Normal LVEDP   Plan: continue medical  management. Will resume IV heparin until coumadin is again therapeutic given history of CVA.   EKG:  EKG is  ordered today.  The ekg ordered today demonstrates sinus rhythm with HR 61, LVH, QRS 120 ms - stable from prior tracing  Recent Labs: 11/24/2020: B Natriuretic Peptide 112.5 11/27/2020: ALT 31 12/11/2020: Hemoglobin 12.9; Platelets 148.0 01/18/2021: TSH 1.47 02/22/2021: BUN 21; Creatinine, Ser 1.21; Magnesium 2.2; Potassium 4.3; Sodium 138  Recent Lipid Panel    Component Value Date/Time   CHOL 152 03/05/2021 1208   CHOL 138 12/05/2016 1100   TRIG 188.0 (H) 03/05/2021 1208   HDL 44.10 03/05/2021 1208   HDL 46 12/05/2016 1100   CHOLHDL 3 03/05/2021 1208   VLDL 37.6 03/05/2021 1208   LDLCALC 71 03/05/2021 1208   LDLCALC 67 12/05/2016 1100     Risk Assessment/Calculations:    CHA2DS2-VASc Score = 8   This indicates a 10.8% annual risk of stroke. The patient's score is based upon: CHF History: 1 HTN History: 1 Diabetes History: 1 Stroke History: 2 Vascular Disease History: 1 Age Score: 2 Gender Score: 0   Physical Exam:    VS:  BP 116/74   Pulse 61   Ht 6' 3"  (1.905 m)   Wt 242 lb 6.4 oz (110 kg)   SpO2 95%   BMI 30.30 kg/m     Wt Readings from Last 3 Encounters:  08/19/21 242 lb 6.4 oz (110 kg)  07/30/21 249 lb 12.8 oz (113.3 kg)  04/04/21 252 lb (114.3 kg)     GEN:  Well nourished, well developed in no acute distress HEENT: Normal NECK: No JVD; No carotid bruits LYMPHATICS: No lymphadenopathy CARDIAC: RRR, no murmurs, rubs, gallops RESPIRATORY:  Clear to auscultation without rales, wheezing or rhonchi  ABDOMEN: Soft, non-tender, non-distended MUSCULOSKELETAL:  No edema; No deformity  SKIN: Warm and dry NEUROLOGIC:  Alert and oriented x 3 PSYCHIATRIC:  Normal affect   ASSESSMENT:    1. Coronary artery disease involving native coronary artery of native heart  without angina pectoris   2. CAD S/P CFX DES 2011   3. Hx of CABG '04   4. Secondary hypercoagulable state ([place])   5. Essential hypertension   6. Pure hypercholesterolemia   7. Chronic diastolic CHF (congestive heart failure) ([place])   8. Primary hypertension   9. Preoperative cardiovascular examination    PLAN:    In order of problems listed above:  CAD s/p CABG 3/4 grafts patent in 2019, he has an occluded SVG-LCx Continue medical management    Hyperlipidemia with LDL goal less than 70 03/05/2021: Cholesterol 152; HDL 44.10; LDL Cholesterol 71; Triglycerides 188.0; VLDL 37.6 Continue lipitor 40 mg   Chronic diastolic heart failure Hypertension Uses compression stockings He takes lasix about 3 times per week for lower extremity swelling - trouble with swelling in left leg (SV harvest side)   Persistent atrial fibrillation Maintained on dofetilide EKG today with sinus rhythm He is not sure if he is taking he 180 mg cardizem   History of amiodarone lung toxicity - DLCO was as low as 55 9/18 but CT showed no evidence of interstitial lung disease, pulmonologist Dr. Lamonte Sakai   Chronic anticoagulation Continue Eliquis 5 mg twice daily   DM Not on insulin   Preoperative risk evaluation for Mace Risk factors include: CAD, CVA - I did not factor his diastolic dysfunction. Remote history of CVA. Heart cath in 2019 with 3-4 patent grafts. He can complete 4.0 METS without angina. He  has a 6.6% risk of MACE. I do not think additional testing will mitigate his risk at this time.   Therefore, based on ACC/AHA guidelines, the patient would be at acceptable risk for the planned procedure without further cardiovascular testing.   The patient was advised that if he develops new symptoms prior to surgery to contact our office to arrange for a follow-up visit, and he verbalized understanding.  ______________________________________  Oral anticoagulation hold: Patient with diagnosis  of afib on Eliquis for anticoagulation.     Procedure: hemorrhoidectomy Date of procedure: TBD   CHA2DS2-VASc Score = 8  This indicates a 10.8% annual risk of stroke. The patient's score is based upon: CHF History: 1 HTN History: 1 Diabetes History: 1 Stroke History: 2 Vascular Disease History: 1 Age Score: 2 Gender Score: 0   CrCl 68m/min using adjusted body weight Platelet count 148K   Per office protocol, patient can hold Eliquis for 1 day prior to procedure. He should resume as soon as safely possible after given his elevated CV risk.   Follow up in 6 months.     Medication Adjustments/Labs and Tests Ordered: Current medicines are reviewed at length with the patient today.  Concerns regarding medicines are outlined above.  Orders Placed This Encounter  Procedures   EKG 12-Lead   No orders of the defined types were placed in this encounter.   Patient Instructions  Medication Instructions:  No Changes *If you need a refill on your cardiac medications before your next appointment, please call your pharmacy*   Lab Work: No Labs If you have labs (blood work) drawn today and your tests are completely normal, you will receive your results only by: [facility](if you have MyChart) OR A paper copy in the mail If you have any lab test that is abnormal or we need to change your treatment, we will call you to review the results.   Testing/Procedures: No Testing   Follow-Up: At [facility] you and your health needs are our priority.  As part of our continuing mission to provide you with exceptional heart care, we have created designated Provider Care Teams.  These Care Teams include your primary Cardiologist (physician) and Advanced Practice Providers (APPs -  Physician Assistants and Nurse Practitioners) who all work together to provide you with the care you need, when you need it.  We recommend signing up for the patient portal called "MyChart".  Sign up  information is provided on this After Visit Summary.  MyChart is used to connect with patients for Virtual Visits (Telemedicine).  Patients are able to view lab/test results, encounter notes, upcoming appointments, etc.  Non-urgent messages can be sent to your provider as well.   To learn more about what you can do with MyChart, go to [email or web address]    Your next appointment:   6 month(s)  The format for your next appointment:   In Person  Provider:   MCandee Furbish MD       Important Information About Sugar         Signed, ALedora Bottcher PUtah 08/19/2021 5:00 PM    [facility]

## 2021-08-28 ENCOUNTER — Encounter (HOSPITAL_COMMUNITY)
Admission: RE | Admit: 2021-08-28 | Discharge: 2021-08-28 | Disposition: A | Discharge status: Home / Self Care | Payer: Medicare Other | Source: Ambulatory Visit | Attending: Surgery | Admitting: Surgery

## 2021-08-28 DIAGNOSIS — Z01812 Encounter for preprocedural laboratory examination: Secondary | ICD-10-CM | POA: Insufficient documentation

## 2021-08-28 LAB — BASIC METABOLIC PANEL
Anion gap: 6 (ref 5–15)
BUN: 21 mg/dL (ref 8–23)
CO2: 28 mmol/L (ref 22–32)
Calcium: 9.2 mg/dL (ref 8.9–10.3)
Chloride: 106 mmol/L (ref 98–111)
Creatinine, Ser: 0.93 mg/dL (ref 0.61–1.24)
GFR, Estimated: >60 mL/min (ref >60)
Glucose, Bld: 173 mg/dL — ABNORMAL HIGH (ref 70–99)
Potassium: 3.9 mmol/L (ref 3.5–5.1)
Sodium: 140 mmol/L (ref 135–145)

## 2021-08-28 LAB — GLUCOSE, CAPILLARY: Glucose-Capillary: 190 mg/dL — ABNORMAL HIGH (ref 70–99)

## 2021-08-28 LAB — HEMOGLOBIN A1C
Hgb A1c MFr Bld: 5.9 % — ABNORMAL HIGH (ref 4.8–5.6)
Mean Plasma Glucose: 122.63 mg/dL

## 2021-08-28 LAB — CBC
HCT: 40.9 % (ref 39.0–52.0)
Hemoglobin: 13.4 g/dL (ref 13.0–17.0)
MCH: 31.2 pg (ref 26.0–34.0)
MCHC: 32.8 g/dL (ref 30.0–36.0)
MCV: 95.3 fL (ref 80.0–100.0)
Platelets: 146 10*3/uL — ABNORMAL LOW (ref 150–400)
RBC: 4.29 MIL/uL (ref 4.22–5.81)
RDW: 13.2 % (ref 11.5–15.5)
WBC: 6.3 10*3/uL (ref 4.0–10.5)
nRBC: 0 % (ref 0.0–0.2)

## 2021-08-28 NOTE — Progress Notes (Signed)
DUE TO COVID-19 ONLY  2  VISITOR IS ALLOWED TO COME WITH YOU AND STAY IN THE WAITING ROOM ONLY DURING PRE OP AND PROCEDURE DAY OF SURGERY.  4  VISITOR  MAY VISIT WITH YOU AFTER SURGERY IN YOUR PRIVATE ROOM DURING VISITING HOURS ONLY! YOU MAY HAVE ONE PERSON SPEND THE NITE WITH YOU IN YOUR ROOM AFTER SURGERY.      Your procedure is scheduled on:          09/05/21   Report to [facility] Main  Entrance   Report to admitting at    1215pm             DO NOT BRING INSURANCE CARD, PICTURE ID OR WALLET DAY OF SURGERY.      Call this number if you have problems the morning of surgery [phone]    RECTAL PREP:  Day prior to surgery:  Liquids/pureed foods only.   Clear liquids all day.   At 1000 am - 2 ounces of Milk of magnesia  At 200pm- 2 ounces of Milk of magnesia    REMEMBER: NO  CANDY, GUM OR MINTS AFTER MIDNITE THE NITE BEFORE SURGERY .       Marland Kitchen CLEAR LIQUIDS UNTIL       1130AM          DAY OF SURGERY.      PLEASE FINISH  G2 LOWER SUGAR DRINK PER SURGEON ORDER  WHICH NEEDS TO BE COMPLETED AT  1130AM         MORNING OF SURGERY.       CLEAR LIQUID DIET   Foods Allowed      WATER BLACK COFFEE ( SUGAR OK, NO MILK, CREAM OR CREAMER) REGULAR AND DECAF  TEA ( SUGAR OK NO MILK, CREAM, OR CREAMER) REGULAR AND DECAF  PLAIN JELLO ( NO RED)  FRUIT ICES ( NO RED, NO FRUIT PULP)  POPSICLES ( NO RED)  JUICE- APPLE, WHITE GRAPE AND WHITE CRANBERRY  SPORT DRINK LIKE GATORADE ( NO RED)  CLEAR BROTH ( VEGETABLE , CHICKEN OR BEEF)                                                                     BRUSH YOUR TEETH MORNING OF SURGERY AND RINSE YOUR MOUTH OUT, NO CHEWING GUM CANDY OR MINTS.     Take these medicines the morning of surgery with A SIP OF WATER:  CARDIZEM, ALLOPURINOL, SYNTHROID, TIKOSYN, TOPROL, PROTONIX    DO NOT TAKE ANY DIABETIC MEDICATIONS DAY OF YOUR SURGERY                               You may not have any metal on your body including hair pins and               piercings  Do not wear jewelry, make-up, lotions, powders or perfumes, deodorant             Do not wear nail polish on your fingernails.              IF YOU ARE A MALE AND WANT TO SHAVE UNDER ARMS OR LEGS PRIOR TO SURGERY YOU MUST DO SO AT  LEAST 48 HOURS PRIOR TO SURGERY.              Men may shave face and neck.   Do not bring valuables to the hospital. [place].  Contacts, dentures or bridgework may not be worn into surgery.  Leave suitcase in the car. After surgery it may be brought to your room.     Patients discharged the day of surgery will not be allowed to drive home. IF YOU ARE HAVING SURGERY AND GOING HOME THE SAME DAY, YOU MUST HAVE AN ADULT TO DRIVE YOU HOME AND BE WITH YOU FOR 24 HOURS. YOU MAY GO HOME BY TAXI OR UBER OR ORTHERWISE, BUT AN ADULT MUST ACCOMPANY YOU HOME AND STAY WITH YOU FOR 24 HOURS.                Please read over the following fact sheets you were given: _____________________________________________________________________  [facility] - Preparing for Surgery Before surgery, you can play an important role.  Because skin is not sterile, your skin needs to be as free of germs as possible.  You can reduce the number of germs on your skin by washing with CHG (chlorahexidine gluconate) soap before surgery.  CHG is an antiseptic cleaner which kills germs and bonds with the skin to continue killing germs even after washing. Please DO NOT use if you have an allergy to CHG or antibacterial soaps.  If your skin becomes reddened/irritated stop using the CHG and inform your nurse when you arrive at Short Stay. Do not shave (including legs and underarms) for at least 48 hours prior to the first CHG shower.  You may shave your face/neck. Please follow these instructions carefully:  1.  Shower with CHG Soap the night before surgery and the  morning of Surgery.  2.  If you choose to wash your hair, wash your hair first as  usual with your  normal  shampoo.  3.  After you shampoo, rinse your hair and body thoroughly to remove the  shampoo.                           4.  Use CHG as you would any other liquid soap.  You can apply chg directly  to the skin and wash                       Gently with a scrungie or clean washcloth.  5.  Apply the CHG Soap to your body ONLY FROM THE NECK DOWN.   Do not use on face/ open                           Wound or open sores. Avoid contact with eyes, ears mouth and genitals (private parts).                       Wash face,  Genitals (private parts) with your normal soap.             6.  Wash thoroughly, paying special attention to the area where your surgery  will be performed.  7.  Thoroughly rinse your body with warm water from the neck down.  8.  DO NOT shower/wash with your normal soap after using and rinsing off  the CHG Soap.                9.  Pat yourself dry with a clean towel.            10.  Wear clean pajamas.            11.  Place clean sheets on your bed the night of your first shower and do not  sleep with pets. Day of Surgery : Do not apply any lotions/deodorants the morning of surgery.  Please wear clean clothes to the hospital/surgery center.  FAILURE TO FOLLOW THESE INSTRUCTIONS MAY RESULT IN THE CANCELLATION OF YOUR SURGERY PATIENT SIGNATURE_________________________________  NURSE SIGNATURE__________________________________  ________________________________________________________________________

## 2021-08-28 NOTE — Progress Notes (Addendum)
Anesthesia Review:  PCP: Debbrah Alar  LOV 03/04/21  Cardiologist : Jolyn Nap LOV with Maryfrances Bunnell on 08/19/21 for clearance.   DR Candee Furbish  Neuro- Celine Ahr LOV 06/06/21  Chest x-ray : 11/24/20-1view  EKG : 08/19/21  Echo : Stress test: Cardiac Cath : 2019  Activity level: can do a flight of stairs without difficulty  Sleep Study/ CPAP : no cpap but uses 2L of oxygen at hs  Fasting Blood Sugar :      / Checks Blood Sugar -- times a day:   Blood Thinner/ Instructions /Last Dose: ASA / Instructions/ Last Dose :   Elliquis - none am of surgery per pt  DM- type 2 - on no meds rarely checks glucose at home per pt  Hgba1c-5.9 on 08/28/21  PT aware of rectal prep instructions  from office and reviewed those with pt . Also listed on preop instructions from [facility].   Phone call completed on 08/29/20.  Medical hx and preop instructions completed.  PT allowed to answer questions and voiced understanding.

## 2021-08-29 ENCOUNTER — Encounter (HOSPITAL_COMMUNITY)
Admission: RE | Admit: 2021-08-29 | Discharge: 2021-08-29 | Disposition: A | Discharge status: Home / Self Care | Payer: Medicare Other | Source: Ambulatory Visit | Attending: Surgery | Admitting: Surgery

## 2021-08-29 ENCOUNTER — Other Ambulatory Visit: Payer: Self-pay

## 2021-08-29 ENCOUNTER — Encounter (HOSPITAL_COMMUNITY): Payer: Self-pay

## 2021-08-29 VITALS — BP 136/72 | HR 58 | Temp 97.9°F | Resp 17 | Ht 75.0 in | Wt 245.0 lb

## 2021-08-29 DIAGNOSIS — Z01818 Encounter for other preprocedural examination: Secondary | ICD-10-CM

## 2021-08-29 DIAGNOSIS — Z8639 Personal history of other endocrine, nutritional and metabolic disease: Secondary | ICD-10-CM

## 2021-08-29 HISTORY — DX: Acute myocardial infarction, unspecified: I21.9

## 2021-08-29 HISTORY — DX: Unspecified osteoarthritis, unspecified site: M19.90

## 2021-08-30 NOTE — Anesthesia Preprocedure Evaluation (Addendum)
Anesthesia Evaluation  Patient identified by MRN, date of birth, ID band Patient awake    Reviewed: Allergy & Precautions, NPO status , Patient's Chart, lab work & pertinent test results, reviewed documented beta blocker date and time   Airway Mallampati: I  TM Distance: >3 FB Neck ROM: Full    Dental no notable dental hx. (+) Teeth Intact, Dental Advisory Given   Pulmonary sleep apnea (no CPAP) ,    Pulmonary exam normal breath sounds clear to auscultation       Cardiovascular hypertension, Pt. on medications and Pt. on home beta blockers + angina + CAD, + Past MI, + Cardiac Stents, + CABG and +CHF  Normal cardiovascular exam+ dysrhythmias (on eliquis) Atrial Fibrillation  Rhythm:Regular Rate:Normal  EKG: 08/19/2021 Rate 61 bpm  NSR with sinus arrhythmia LAFB Left ventricular hypertrophy with QRS widening Nonspecific T wave abnormality   CV: Myocardial perfusion 05/07/2017 ? Nuclear stress EF: 53%. ? There was no ST segment deviation noted during stress. ? Small inferolateral defect (mild/moderate intensity) consistent with soft tissue attenuation and/or subendocardial scar. No ischemia ? This is a low risk study.  Echo 02/18/2018 - Left ventricle: The cavity size was normal. There was mild focal  basal hypertrophy of the septum. Systolic function was normal.  The estimated ejection fraction was in the range of 55% to 60%.  Wall motion was normal; there were no regional wall motion  abnormalities. Doppler parameters are consistent with abnormal  left ventricular relaxation (grade 1 diastolic dysfunction).  Doppler parameters are consistent with elevated ventricular  end-diastolic filling pressure.  - Mitral valve: Calcified annulus. Mildly thickened leaflets .  There was mild regurgitation.  - Left atrium: The atrium was mildly dilated.  - Right ventricle: The cavity size was mildly dilated. Wall   thickness was normal. Systolic function was mildly reduced.  - Right atrium: The atrium was normal in size.  - Tricuspid valve: There was mild regurgitation.  - Pulmonary arteries: Systolic pressure was within the normal  range.  - Inferior vena cava: The vessel was normal in size.  - Pericardium, extracardiac: There was no pericardial effusion.    Neuro/Psych CVA (left sided weakness), Residual Symptoms negative psych ROS   GI/Hepatic Neg liver ROS, GERD  ,  Endo/Other  diabetesHypothyroidism   Renal/GU negative Renal ROS  negative genitourinary   Musculoskeletal  (+) Arthritis ,   Abdominal   Peds  Hematology negative hematology ROS (+)   Anesthesia Other Findings   Reproductive/Obstetrics                          Anesthesia Physical Anesthesia Plan  ASA: 3  Anesthesia Plan: General   Post-op Pain Management: Tylenol PO (pre-op)*   Induction: Intravenous  PONV Risk Score and Plan: 2 and Midazolam, Dexamethasone and Ondansetron  Airway Management Planned: Oral ETT  Additional Equipment:   Intra-op Plan:   Post-operative Plan: Extubation in OR  Informed Consent: I have reviewed the patients History and Physical, chart, labs and discussed the procedure including the risks, benefits and alternatives for the proposed anesthesia with the patient or authorized representative who has indicated his/her understanding and acceptance.     Dental advisory given  Plan Discussed with: CRNA  Anesthesia Plan Comments: (See PAT note 08/29/2021)       Anesthesia Quick Evaluation

## 2021-08-30 NOTE — Progress Notes (Signed)
Anesthesia Chart Review   Case: [id number] Date/Time: 09/05/21 1415   Procedures:      HEMORRHOIDECTOMY WITH LIGATION AND HEMORRHOIDOPEXY     ANORECTAL EXAM UNDER ANESTHESIA   Anesthesia type: General   Pre-op diagnosis: HEMORRHOIDS GRADE 2 AND LARGE EXTERNAL HEMORRHOIDS WITH BLEEDING AND PAIN   Location: [facility] ROOM 02 / [facility]   Surgeons: Michael Boston, MD       DISCUSSION:76 y.o. never smoker with h/o OSA, HTN, CAD s/p CABG, CHF, PAF, stroke, hemorrhoids  scheduled for above procedure 09/05/21 with Dr. Michael Boston.   Pt seen by cardiology 08/19/2021 for preoperative evaluation.  Per OV note, "Risk factors include: CAD, CVA - I did not factor his diastolic dysfunction. Remote history of CVA. Heart cath in 2019 with 3-4 patent grafts. He can complete 4.0 METS without angina. He has a 6.6% risk of MACE. I do not think additional testing will mitigate his risk at this time.    Therefore, based on ACC/AHA guidelines, the patient would be at acceptable risk for the planned procedure without further cardiovascular testing.  Per office protocol, patient can hold Eliquis for 1 day prior to procedure. He should resume as soon as safely possible after given his elevated CV risk"  Anticipate pt can proceed with planned procedure barring acute status change.   VS: BP 136/72 (BP Location: Right Arm)   Pulse (!) 58   Temp 36.6 C (Oral)   Resp 17   Ht _0  (1.905 m)   Wt 111.1 kg   SpO2 99%   BMI 30.62 kg/m   PROVIDERS: Debbrah Alar, NP is PCP   Cardiologist:  Candee Furbish, MD Electrophysiologist:  Virl Axe, MD    LABS: Labs reviewed: Acceptable for surgery. (all labs ordered are listed, but only abnormal results are displayed)  Labs Reviewed - No data to display   IMAGES:   EKG: 08/19/2021 Rate 61 bpm  NSR with sinus arrhythmia LAFB Left ventricular hypertrophy with QRS widening Nonspecific T wave abnormality   CV: Myocardial perfusion 05/07/2017 Nuclear stress EF:  53%. There was no ST segment deviation noted during stress. Small inferolateral defect (mild/moderate intensity) consistent with soft tissue attenuation and/or subendocardial scar. No ischemia This is a low risk study.  Echo 02/18/2018 - Left ventricle: The cavity size was normal. There was mild focal    basal hypertrophy of the septum. Systolic function was normal.    The estimated ejection fraction was in the range of 55% to 60%.    Wall motion was normal; there were no regional wall motion    abnormalities. Doppler parameters are consistent with abnormal    left ventricular relaxation (grade 1 diastolic dysfunction).    Doppler parameters are consistent with elevated ventricular    end-diastolic filling pressure.  - Mitral valve: Calcified annulus. Mildly thickened leaflets .    There was mild regurgitation.  - Left atrium: The atrium was mildly dilated.  - Right ventricle: The cavity size was mildly dilated. Wall    thickness was normal. Systolic function was mildly reduced.  - Right atrium: The atrium was normal in size.  - Tricuspid valve: There was mild regurgitation.  - Pulmonary arteries: Systolic pressure was within the normal    range.  - Inferior vena cava: The vessel was normal in size.  - Pericardium, extracardiac: There was no pericardial effusion.  Past Medical History:  Diagnosis Date   Acute encephalopathy 12/12/2015   Acute respiratory failure with hypoxia and hypercapnia (HCC) 12/12/2015  Allergic rhinitis 02/13/2010   Qualifier: Diagnosis of  By: Wynona Luna    Angina at rest [facility]) 10/22/2015   Arthritis    Ataxia 08/10/2013   Atrial fibrillation ([place]) 11/2018   B12 deficiency 08/11/2013   BACK PAIN, LUMBAR 01/29/2009   Qualifier: Diagnosis of  By: Wynona Luna    BENIGN POSITIONAL VERTIGO 11/23/2009   Qualifier: Diagnosis of  By: Wynona Luna    Chronic diastolic heart failure ([place]) 04/24/2015   Chronic pain    left sided-[place]    Coronary 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 ([place]) 11/04/2015   s/p CABG 2004; CFX DES 2011   Essential hypertension 12/12/2006   Qualifier: Diagnosis of  By: [name] RMA, [place]     GERD 12/12/2006   Qualifier: Diagnosis of  By: Reatha Armour, [place]     GOUT 12/12/2006   Qualifier: Diagnosis of  By: [name] RMA, [place]     Hemorrhoids    Hyperlipidemia LDL goal <70 12/12/2006   Qualifier: Diagnosis of  By: [name] RMA, [place]      Hypogonadism male 04/10/2011   Hypothyroidism, acquired 02/15/2013   Insomnia 05/21/2013   Long term current use of anticoagulant therapy 06/24/2011   Myocardial infarction ([place])    Obstructive sleep apnea-Failed CPAP 06/17/2007   Failed CPAP Using O 2 for sleep    Paroxysmal atrial fibrillation ([place]) 12/06/2016   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   also 1996; resultant hemiplegia of dominant side   Thoracic aorta atherosclerosis ([place]) 04/27/2017   Tubular adenoma of colon 05/2010   Type 2 diabetes mellitus without complication, without long-term current use of insulin ([place]) 10/05/2007   Qualifier: Diagnosis of  By: Wynona Luna    Vasovagal syncope    Vasovagal syncope    Wears glasses     Past Surgical History:  Procedure Laterality Date   CARDIAC CATHETERIZATION N/A 10/25/2015   Procedure: Left Heart Cath and Cors/Grafts Angiography;  Surgeon: Troy Sine, MD;  Location: [place] [facility];  Service: Cardiovascular;  Laterality: N/A;   CARDIOVERSION  06/20/2011   Procedure: CARDIOVERSION;  Surgeon: Larey Dresser, MD;  Location: [place];  Service: Cardiovascular;  Laterality: N/A;   CARDIOVERSION N/A 04/26/2015   Procedure: CARDIOVERSION;  Surgeon: Dorothy Spark, MD;  [facility] ENDOSCOPY;  Service: Cardiovascular;  Laterality: N/A;   CARDIOVERSION N/A 05/10/2015   Procedure: CARDIOVERSION;  Surgeon: Larey Dresser, MD;  Location: [place];  Service: Cardiovascular;  Laterality: N/A;   CARDIOVERSION N/A 12/10/2018    Procedure: CARDIOVERSION;  Surgeon: Sanda Klein, MD;  Location: [place];  Service: Cardiovascular;  Laterality: N/A;   CATARACT EXTRACTION  05/2010   left eye   CATARACT EXTRACTION  04/2010   right eye   COLONOSCOPY WITH PROPOFOL N/A 02/22/2020   Procedure: COLONOSCOPY WITH PROPOFOL;  Surgeon: Lavena Bullion, DO;  [facility];  Service: Gastroenterology;  Laterality: N/A;   CORONARY ARTERY BYPASS GRAFT     stent   ESOPHAGOGASTRODUODENOSCOPY  03-25-2005   LEFT HEART CATH AND CORS/GRAFTS ANGIOGRAPHY N/A 02/19/2018   Procedure: LEFT HEART CATH AND CORS/GRAFTS ANGIOGRAPHY;  Surgeon: Martinique, Peter M, MD;  Location: [place] [facility];  Service: Cardiovascular;  Laterality: N/A;   NASAL SEPTUM SURGERY     PERCUTANEOUS PLACEMENT INTRAVASCULAR STENT CERVICAL CAROTID ARTERY     03-2009; using a drug-eluting platform of the circumflex cornoray artery with a 3.0 x 18 [employer] Promus drug-eluting  platform post dilated to 3.75 with a noncompliant balloon.   POLYPECTOMY  02/22/2020   Procedure: POLYPECTOMY;  Surgeon: Lavena Bullion, DO;  [facility];  Service: Gastroenterology;;   TEE WITHOUT CARDIOVERSION  06/20/2011   Procedure: TRANSESOPHAGEAL ECHOCARDIOGRAM (TEE);  Surgeon: Larey Dresser, MD;  Location: [facility] ENDOSCOPY;  Service: Cardiovascular;  Laterality: N/A;    MEDICATIONS:  albuterol (VENTOLIN HFA) 108 (90 Base) MCG/ACT inhaler   allopurinol (ZYLOPRIM) 100 MG tablet   atorvastatin (LIPITOR) 40 MG tablet   benazepril (LOTENSIN) 10 MG tablet   benazepril (LOTENSIN) 5 MG tablet   blood glucose meter kit and supplies KIT   calcium carbonate (TUMS - DOSED IN MG ELEMENTAL CALCIUM) 500 MG chewable tablet   Continuous Blood Gluc Receiver (FREESTYLE LIBRE 2 READER) DEVI   Continuous Blood Gluc Sensor (FREESTYLE LIBRE 14 DAY SENSOR) MISC   cyanocobalamin 1000 MCG tablet   diltiazem (CARDIZEM CD) 180 MG 24 hr capsule   dofetilide (TIKOSYN) 500 MCG capsule    ELIQUIS 5 MG TABS tablet   ferrous sulfate 325 (65 FE) MG tablet   fluticasone (FLONASE) 50 MCG/ACT nasal spray   folic acid (FOLVITE) 1 MG tablet   furosemide (LASIX) 40 MG tablet   gabapentin (NEURONTIN) 100 MG capsule   glucose blood (ACCU-CHEK GUIDE) test strip   levothyroxine (SYNTHROID) 112 MCG tablet   metoprolol succinate (TOPROL-XL) 25 MG 24 hr tablet   Multiple Vitamins-Minerals (MULTIVITAMIN GUMMIES MENS PO)   nitroGLYCERIN (NITROSTAT) 0.4 MG SL tablet   OXYGEN   pantoprazole (PROTONIX) 40 MG tablet   sennosides-docusate sodium (SENOKOT-S) 8.6-50 MG tablet   traMADol (ULTRAM) 50 MG tablet   traMADol (ULTRAM-ER) 200 MG 24 hr tablet   No current facility-administered medications for this encounter.     Konrad Felix Ward, PA-C [facility] [phone]

## 2021-09-05 ENCOUNTER — Ambulatory Visit (HOSPITAL_COMMUNITY): Payer: Medicare Other | Admitting: Physician Assistant

## 2021-09-05 ENCOUNTER — Ambulatory Visit (HOSPITAL_BASED_OUTPATIENT_CLINIC_OR_DEPARTMENT_OTHER): Payer: Medicare Other | Admitting: Certified Registered Nurse Anesthetist

## 2021-09-05 ENCOUNTER — Other Ambulatory Visit: Payer: Self-pay

## 2021-09-05 ENCOUNTER — Encounter (HOSPITAL_COMMUNITY)
Admission: RE | Disposition: A | Discharge status: Home / Self Care | Payer: Self-pay | Source: Ambulatory Visit | Attending: Surgery

## 2021-09-05 ENCOUNTER — Encounter (HOSPITAL_COMMUNITY): Payer: Self-pay | Admitting: Surgery

## 2021-09-05 ENCOUNTER — Ambulatory Visit (HOSPITAL_COMMUNITY)
Admission: RE | Admit: 2021-09-05 | Discharge: 2021-09-05 | Disposition: A | Discharge status: Home / Self Care | Payer: Medicare Other | Source: Ambulatory Visit | Attending: Surgery | Admitting: Surgery

## 2021-09-05 DIAGNOSIS — M1990 Unspecified osteoarthritis, unspecified site: Secondary | ICD-10-CM | POA: Insufficient documentation

## 2021-09-05 DIAGNOSIS — K641 Second degree hemorrhoids: Secondary | ICD-10-CM

## 2021-09-05 DIAGNOSIS — E039 Hypothyroidism, unspecified: Secondary | ICD-10-CM

## 2021-09-05 DIAGNOSIS — I69398 Other sequelae of cerebral infarction: Secondary | ICD-10-CM | POA: Insufficient documentation

## 2021-09-05 DIAGNOSIS — E119 Type 2 diabetes mellitus without complications: Secondary | ICD-10-CM | POA: Diagnosis not present

## 2021-09-05 DIAGNOSIS — I4891 Unspecified atrial fibrillation: Secondary | ICD-10-CM | POA: Insufficient documentation

## 2021-09-05 DIAGNOSIS — Z79899 Other long term (current) drug therapy: Secondary | ICD-10-CM | POA: Insufficient documentation

## 2021-09-05 DIAGNOSIS — I509 Heart failure, unspecified: Secondary | ICD-10-CM | POA: Diagnosis not present

## 2021-09-05 DIAGNOSIS — K219 Gastro-esophageal reflux disease without esophagitis: Secondary | ICD-10-CM | POA: Insufficient documentation

## 2021-09-05 DIAGNOSIS — Z7901 Long term (current) use of anticoagulants: Secondary | ICD-10-CM | POA: Diagnosis not present

## 2021-09-05 DIAGNOSIS — I2510 Atherosclerotic heart disease of native coronary artery without angina pectoris: Secondary | ICD-10-CM | POA: Insufficient documentation

## 2021-09-05 DIAGNOSIS — K644 Residual hemorrhoidal skin tags: Secondary | ICD-10-CM

## 2021-09-05 DIAGNOSIS — I252 Old myocardial infarction: Secondary | ICD-10-CM | POA: Diagnosis not present

## 2021-09-05 DIAGNOSIS — G4730 Sleep apnea, unspecified: Secondary | ICD-10-CM | POA: Diagnosis not present

## 2021-09-05 DIAGNOSIS — Z955 Presence of coronary angioplasty implant and graft: Secondary | ICD-10-CM | POA: Insufficient documentation

## 2021-09-05 DIAGNOSIS — Z01818 Encounter for other preprocedural examination: Secondary | ICD-10-CM

## 2021-09-05 DIAGNOSIS — I110 Hypertensive heart disease with heart failure: Secondary | ICD-10-CM | POA: Diagnosis not present

## 2021-09-05 DIAGNOSIS — Z951 Presence of aortocoronary bypass graft: Secondary | ICD-10-CM | POA: Diagnosis not present

## 2021-09-05 DIAGNOSIS — K648 Other hemorrhoids: Secondary | ICD-10-CM | POA: Diagnosis not present

## 2021-09-05 DIAGNOSIS — I071 Rheumatic tricuspid insufficiency: Secondary | ICD-10-CM | POA: Diagnosis not present

## 2021-09-05 DIAGNOSIS — K649 Unspecified hemorrhoids: Secondary | ICD-10-CM | POA: Diagnosis not present

## 2021-09-05 DIAGNOSIS — Z8639 Personal history of other endocrine, nutritional and metabolic disease: Secondary | ICD-10-CM

## 2021-09-05 HISTORY — PX: RECTAL EXAM UNDER ANESTHESIA: SHX6399

## 2021-09-05 HISTORY — PX: HEMORRHOID SURGERY: SHX153

## 2021-09-05 LAB — GLUCOSE, CAPILLARY: Glucose-Capillary: 107 mg/dL — ABNORMAL HIGH (ref 70–99)

## 2021-09-05 SURGERY — HEMORRHOIDECTOMY
Anesthesia: General | Site: Rectum

## 2021-09-05 MED ORDER — CHLORHEXIDINE GLUCONATE 0.12 % MT SOLN
15.0000 mL | Freq: Once | OROMUCOSAL | Status: AC
  Administered 2021-09-05: 15 mL via OROMUCOSAL

## 2021-09-05 MED ORDER — PHENYLEPHRINE HCL-NACL 20-0.9 MG/250ML-% IV SOLN
INTRAVENOUS | Status: AC
  Filled 2021-09-05: qty 250

## 2021-09-05 MED ORDER — ONDANSETRON HCL 4 MG/2ML IJ SOLN
INTRAMUSCULAR | Status: DC | PRN
  Administered 2021-09-05: 4 mg via INTRAVENOUS

## 2021-09-05 MED ORDER — DEXAMETHASONE SODIUM PHOSPHATE 10 MG/ML IJ SOLN
INTRAMUSCULAR | Status: DC | PRN
  Administered 2021-09-05: 10 mg via INTRAVENOUS

## 2021-09-05 MED ORDER — ROCURONIUM BROMIDE 10 MG/ML (PF) SYRINGE
PREFILLED_SYRINGE | INTRAVENOUS | Status: AC
  Filled 2021-09-05: qty 10

## 2021-09-05 MED ORDER — DIBUCAINE (PERIANAL) 1 % EX OINT
TOPICAL_OINTMENT | CUTANEOUS | Status: AC
  Filled 2021-09-05: qty 28

## 2021-09-05 MED ORDER — SODIUM CHLORIDE 0.9 % IV SOLN: 250.0000 mL | INTRAVENOUS | Status: DC | PRN

## 2021-09-05 MED ORDER — FENTANYL CITRATE PF 50 MCG/ML IJ SOSY: 25.0000 ug | PREFILLED_SYRINGE | INTRAMUSCULAR | Status: DC | PRN

## 2021-09-05 MED ORDER — DEXAMETHASONE SODIUM PHOSPHATE 10 MG/ML IJ SOLN
INTRAMUSCULAR | Status: AC
  Filled 2021-09-05: qty 1

## 2021-09-05 MED ORDER — BUPIVACAINE-EPINEPHRINE 0.5% -1:200000 IJ SOLN
INTRAMUSCULAR | Status: DC | PRN
  Administered 2021-09-05: 30 mL

## 2021-09-05 MED ORDER — CHLORHEXIDINE GLUCONATE CLOTH 2 % EX PADS: 6.0000 | MEDICATED_PAD | Freq: Once | CUTANEOUS | Status: DC

## 2021-09-05 MED ORDER — PHENYLEPHRINE HCL-NACL 20-0.9 MG/250ML-% IV SOLN
INTRAVENOUS | Status: DC | PRN
  Administered 2021-09-05: 40 ug/min via INTRAVENOUS

## 2021-09-05 MED ORDER — EPHEDRINE SULFATE-NACL 50-0.9 MG/10ML-% IV SOSY
PREFILLED_SYRINGE | INTRAVENOUS | Status: DC | PRN
  Administered 2021-09-05: 5 mg via INTRAVENOUS

## 2021-09-05 MED ORDER — ORAL CARE MOUTH RINSE: 15.0000 mL | Freq: Once | OROMUCOSAL | Status: AC

## 2021-09-05 MED ORDER — SODIUM CHLORIDE 0.9 % IV SOLN
2.0000 g | INTRAVENOUS | Status: AC
  Administered 2021-09-05: 2 g via INTRAVENOUS
  Filled 2021-09-05: qty 20

## 2021-09-05 MED ORDER — ACETAMINOPHEN 500 MG PO TABS
1000.0000 mg | ORAL_TABLET | ORAL | Status: AC
  Administered 2021-09-05: 1000 mg via ORAL
  Filled 2021-09-05: qty 2

## 2021-09-05 MED ORDER — PHENYLEPHRINE 80 MCG/ML (10ML) SYRINGE FOR IV PUSH (FOR BLOOD PRESSURE SUPPORT)
PREFILLED_SYRINGE | INTRAVENOUS | Status: AC
  Filled 2021-09-05: qty 10

## 2021-09-05 MED ORDER — BUPIVACAINE LIPOSOME 1.3 % IJ SUSP
INTRAMUSCULAR | Status: DC | PRN
  Administered 2021-09-05: 20 mL

## 2021-09-05 MED ORDER — LIDOCAINE 2% (20 MG/ML) 5 ML SYRINGE
INTRAMUSCULAR | Status: DC | PRN
  Administered 2021-09-05: 100 mg via INTRAVENOUS

## 2021-09-05 MED ORDER — OXYCODONE HCL 5 MG PO TABS: 5.0000 mg | ORAL_TABLET | Freq: Four times a day (QID) | ORAL | 0 refills | Status: DC | PRN

## 2021-09-05 MED ORDER — ONDANSETRON HCL 4 MG/2ML IJ SOLN
INTRAMUSCULAR | Status: AC
  Filled 2021-09-05: qty 2

## 2021-09-05 MED ORDER — SODIUM CHLORIDE 0.9% FLUSH: 3.0000 mL | INTRAVENOUS | Status: DC | PRN

## 2021-09-05 MED ORDER — LIDOCAINE HCL (PF) 2 % IJ SOLN
INTRAMUSCULAR | Status: AC
  Filled 2021-09-05: qty 5

## 2021-09-05 MED ORDER — SUGAMMADEX SODIUM 200 MG/2ML IV SOLN
INTRAVENOUS | Status: DC | PRN
  Administered 2021-09-05: 200 mg via INTRAVENOUS

## 2021-09-05 MED ORDER — DIBUCAINE (PERIANAL) 1 % EX OINT
TOPICAL_OINTMENT | CUTANEOUS | Status: DC | PRN
  Administered 2021-09-05: 1 via RECTAL

## 2021-09-05 MED ORDER — GABAPENTIN 300 MG PO CAPS
300.0000 mg | ORAL_CAPSULE | ORAL | Status: AC
  Administered 2021-09-05: 300 mg via ORAL
  Filled 2021-09-05: qty 1

## 2021-09-05 MED ORDER — FENTANYL CITRATE PF 50 MCG/ML IJ SOSY
PREFILLED_SYRINGE | INTRAMUSCULAR | Status: AC
  Administered 2021-09-05: 50 ug via INTRAVENOUS
  Filled 2021-09-05: qty 2

## 2021-09-05 MED ORDER — FENTANYL CITRATE PF 50 MCG/ML IJ SOSY
PREFILLED_SYRINGE | INTRAMUSCULAR | Status: AC
  Filled 2021-09-05: qty 1

## 2021-09-05 MED ORDER — ROCURONIUM BROMIDE 10 MG/ML (PF) SYRINGE
PREFILLED_SYRINGE | INTRAVENOUS | Status: DC | PRN
  Administered 2021-09-05: 100 mg via INTRAVENOUS

## 2021-09-05 MED ORDER — BUPIVACAINE LIPOSOME 1.3 % IJ SUSP: 20.0000 mL | Freq: Once | INTRAMUSCULAR | Status: DC

## 2021-09-05 MED ORDER — ENSURE PRE-SURGERY PO LIQD
296.0000 mL | Freq: Once | ORAL | Status: DC
  Filled 2021-09-05: qty 296

## 2021-09-05 MED ORDER — SODIUM CHLORIDE 0.9% FLUSH: 3.0000 mL | Freq: Two times a day (BID) | INTRAVENOUS | Status: DC

## 2021-09-05 MED ORDER — FENTANYL CITRATE PF 50 MCG/ML IJ SOSY
25.0000 ug | PREFILLED_SYRINGE | INTRAMUSCULAR | Status: DC | PRN
  Administered 2021-09-05 (×2): 50 ug via INTRAVENOUS

## 2021-09-05 MED ORDER — FENTANYL CITRATE (PF) 100 MCG/2ML IJ SOLN
INTRAMUSCULAR | Status: DC | PRN
  Administered 2021-09-05: 50 ug via INTRAVENOUS

## 2021-09-05 MED ORDER — BUPIVACAINE-EPINEPHRINE (PF) 0.5% -1:200000 IJ SOLN
INTRAMUSCULAR | Status: AC
Start: 2021-09-05 — End: ?
  Filled 2021-09-05: qty 30

## 2021-09-05 MED ORDER — FENTANYL CITRATE (PF) 250 MCG/5ML IJ SOLN
INTRAMUSCULAR | Status: AC
  Filled 2021-09-05: qty 5

## 2021-09-05 MED ORDER — BUPIVACAINE LIPOSOME 1.3 % IJ SUSP
INTRAMUSCULAR | Status: AC
  Filled 2021-09-05: qty 20

## 2021-09-05 MED ORDER — LACTATED RINGERS IV SOLN: INTRAVENOUS | Status: DC

## 2021-09-05 MED ORDER — PROPOFOL 10 MG/ML IV BOLUS
INTRAVENOUS | Status: AC
  Filled 2021-09-05: qty 20

## 2021-09-05 MED ORDER — PHENYLEPHRINE 80 MCG/ML (10ML) SYRINGE FOR IV PUSH (FOR BLOOD PRESSURE SUPPORT)
PREFILLED_SYRINGE | INTRAVENOUS | Status: DC | PRN
  Administered 2021-09-05 (×3): 160 ug via INTRAVENOUS
  Administered 2021-09-05: 80 ug via INTRAVENOUS

## 2021-09-05 MED ORDER — PROPOFOL 10 MG/ML IV BOLUS
INTRAVENOUS | Status: DC | PRN
  Administered 2021-09-05: 100 mg via INTRAVENOUS

## 2021-09-05 SURGICAL SUPPLY — 37 items
APL SKNCLS STERI-STRIP NONHPOA (GAUZE/BANDAGES/DRESSINGS) ×2
BAG COUNTER SPONGE SURGICOUNT (BAG) IMPLANT
BAG SPNG CNTER NS LX DISP (BAG)
BENZOIN TINCTURE PRP APPL 2/3 (GAUZE/BANDAGES/DRESSINGS) ×4 IMPLANT
BLADE SURG 15 STRL LF DISP TIS (BLADE) IMPLANT
BLADE SURG 15 STRL SS (BLADE)
CNTNR URN SCR LID CUP LEK RST (MISCELLANEOUS) ×3 IMPLANT
CONT SPEC 4OZ STRL OR WHT (MISCELLANEOUS) ×3
COVER SURGICAL LIGHT HANDLE (MISCELLANEOUS) ×4 IMPLANT
DRAPE LAPAROTOMY T 102X78X121 (DRAPES) ×4 IMPLANT
DRSG PAD ABDOMINAL 8X10 ST (GAUZE/BANDAGES/DRESSINGS) IMPLANT
ELECT REM PT RETURN 15FT ADLT (MISCELLANEOUS) ×4 IMPLANT
GAUZE 4X4 16PLY ~~LOC~~+RFID DBL (SPONGE) ×4 IMPLANT
GAUZE SPONGE 4X4 12PLY STRL (GAUZE/BANDAGES/DRESSINGS) ×1 IMPLANT
GLOVE ECLIPSE 8.0 STRL XLNG CF (GLOVE) ×4 IMPLANT
GLOVE INDICATOR 8.0 STRL GRN (GLOVE) ×4 IMPLANT
GOWN STRL REUS W/ TWL XL LVL3 (GOWN DISPOSABLE) ×9 IMPLANT
GOWN STRL REUS W/TWL XL LVL3 (GOWN DISPOSABLE) ×9
KIT BASIN OR (CUSTOM PROCEDURE TRAY) ×4 IMPLANT
KIT TURNOVER KIT A (KITS) IMPLANT
LOOP VESSEL MAXI BLUE (MISCELLANEOUS) IMPLANT
NEEDLE HYPO 22GX1.5 SAFETY (NEEDLE) ×4 IMPLANT
PACK BASIC VI WITH GOWN DISP (CUSTOM PROCEDURE TRAY) ×4 IMPLANT
PANTS MESH DISP LRG (UNDERPADS AND DIAPERS) ×4 IMPLANT
PENCIL SMOKE EVACUATOR (MISCELLANEOUS) IMPLANT
SHEARS HARMONIC 9CM CVD (BLADE) IMPLANT
SPIKE FLUID TRANSFER (MISCELLANEOUS) ×4 IMPLANT
SURGILUBE 2OZ TUBE FLIPTOP (MISCELLANEOUS) ×4 IMPLANT
SUT CHROMIC 2 0 SH (SUTURE) ×4 IMPLANT
SUT CHROMIC 3 0 SH 27 (SUTURE) IMPLANT
SUT VIC AB 2-0 SH 27 (SUTURE)
SUT VIC AB 2-0 SH 27X BRD (SUTURE) IMPLANT
SUT VIC AB 2-0 UR6 27 (SUTURE) ×24 IMPLANT
SYR 20ML LL LF (SYRINGE) ×4 IMPLANT
SYR 3ML LL SCALE MARK (SYRINGE) IMPLANT
TOWEL OR 17X26 10 PK STRL BLUE (TOWEL DISPOSABLE) ×4 IMPLANT
TOWEL OR NON WOVEN STRL DISP B (DISPOSABLE) ×4 IMPLANT

## 2021-09-05 NOTE — Op Note (Signed)
09/05/2021  3:47 PM  PATIENT:  [name]  76 y.o. male  Patient Care Team: Debbrah Alar, NP as PCP - General (Internal Medicine) Jerline Pain, MD as PCP - Cardiology (Cardiology) Deboraha Sprang, MD as PCP - Electrophysiology (Cardiology) Kirsteins, Luanna Salk, MD as Consulting Physician (Physical Medicine and Rehabilitation) Leta Baptist, Earlean Polka, MD as Consulting Physician (Neurology) Larey Dresser, MD as Consulting Physician (Cardiology) Lavena Bullion, DO as Consulting Physician (Gastroenterology) Michael Boston, MD as Consulting Physician (General Surgery)  PRE-OPERATIVE DIAGNOSIS:  HEMORRHOIDS GRADE 2 AND LARGE EXTERNAL HEMORRHOIDS WITH BLEEDING AND PAIN  POST-OPERATIVE DIAGNOSIS:  HEMORRHOIDS GRADE 2 AND LARGE EXTERNAL HEMORRHOIDS WITH BLEEDING AND PAIN  PROCEDURE:   External hemorrhoidectomy x2 Internal hemorrhoidal ligation and pexy Anorectal examination under anesthesia  SURGEON:  Adin Hector, MD  ANESTHESIA:   General Anorectal & Local field block (0.25% bupivacaine with epinephrine mixed with Liposomal bupivacaine (Experel)   EBL:  Total I/O In: 900 [I.V.:800; IV Piggyback:100] Out: - .  See operative record  Delay start of Pharmacological VTE agent (>24hrs) due to surgical blood loss or risk of bleeding:  NO  DRAINS: NONE  SPECIMEN:   External hemorrhoid x1.  Internal/external hemorrhoid x1.   DISPOSITION OF SPECIMEN:  PATHOLOGY  COUNTS:  YES  PLAN OF CARE: Discharge home after PACU  PATIENT DISPOSITION:  PACU - hemodynamically stable.  INDICATION: Pleasant patient with struggles with hemorrhoids.  Not able to be managed in the office despite an improved bowel regimen.  I recommended examination under anesthesia and surgical treatment:  The anatomy & physiology of the anorectal region was discussed.  The pathophysiology of hemorrhoids and differential diagnosis was discussed.  Natural history risks without surgery was discussed.    I stressed the importance of a bowel regimen to have daily soft bowel movements to minimize progression of disease.  Interventions such as sclerotherapy & banding were discussed.  The patient's symptoms are not adequately controlled by medicines and other non-operative treatments.  I feel the risks & problems of no surgery outweigh the operative risks; therefore, I recommended surgery to treat the hemorrhoids by ligation, pexy, and possible resection.  Risks such as bleeding, infection, need for further treatment, heart attack, death, and other risks were discussed.   I noted a good likelihood this will help address the problem.  Goals of post-operative recovery were discussed as well.  Possibility that this will not correct all symptoms was explained.  Post-operative pain, bleeding, constipation, urinary difficulties, and other problems after surgery were discussed.  We will work to minimize complications.   Educational handouts further explaining the pathology, treatment options, and bowel regimen were given as well.  Questions were answered.  The patient expresses understanding & wishes to proceed with surgery.  OR FINDINGS: Bulky rectum and at least grade 2 hemorrhoids with significant posterior external hemorrhoids.  Internal hemorrhoid ligation pexy done and excision of right posterior and posterior midline hemorrhoid regions.  Patulous anus with decreased sphincter tone.  DESCRIPTION:   Informed consent was confirmed. Patient underwent general anesthesia without difficulty. Patient was placed into  prone/jacknife positioning.  The perianal region was prepped and draped in sterile fashion. Surgical time-out confirmed our plan.  I did digital rectal examination and then transitioned over to anoscopy to get a sense of the anatomy.  Findings noted above.   I proceeded to do hemorrhoidal ligation and pexy using a large self-retaining Parks retractor.  Occasionally alternating with a large Research scientist (life sciences).  I used a 2-0 Vicryl suture on a UR-6 needle in a figure-of-eight fashion 6 cm proximal to the anal verge.  I started at the largest hemorrhoid pile, right posterior.  Because of redundant hemorrhoidal tissue too bulky to merely ligate or pexy, I excised the excess internal hemorrhoid piles longitudinally in a fusiform biconcave fashion, sparing the anal canal to avoid narrowing.  I then ran that stitch longitudinally more distally to close the hemorrhoidectomy wound to the anal verge over a large Parks self-retaining anal retractor to avoid narrowing of the anal canal.  I then tied that stitch down to cause a hemorrhoidopexy.   I also had to do an excision at the left posterior midline pile location.  I then did hemorrhoidal ligation and pexy at the other 4 hemorrhoidal columns.  At the completion of this, all 6 anorectal columns were ligated and pexied in the classic hexagonal fashion (right anterior/lateral/posterior, left anterior/lateral/posterior).    I redid anoscopy and examination.  Hemostasis was good.  I trimmed and closed closed the external part of the hemorrhoidectomy wounds with radial interrupted horizontal mattress 2-0 chromic suture over a large Sawyer anal retractor radially on the right side.  He had a lot of redundant tissue posteriorly so I ended up having to do a little more transection and then closing that wound more transversely on the left & posterior side using 2-0 Vicryl interrupted horizontal mattress sutures.  I repeated anoscopy & examination.  At completion of this, all hemorrhoids had been removed or reduced into the rectum.  There is no more prolapse.  Internal & external anatomy was more more normal.  Hemostasis was good.  Fluffed gauze was on-laid over the perianal region.  No packing done.  Patient is being extubated go to go to the recovery room.  I had discussed postop care in detail with the patient in the preop holding area.  Instructions for post-operative  recovery and prescriptions are written. I discussed operative findings, updated the patient's status, discussed probable steps to recovery, and gave postoperative recommendations to the patient's spouse, Arvid Marengo.   Recommendations were made.  Questions were answered.  She expressed understanding & appreciation.  Adin Hector, M.D., F.A.C.S. Gastrointestinal and Minimally Invasive Surgery [facility] [address] [place], [place] [address] [phone] Main / Paging

## 2021-09-05 NOTE — Interval H&P Note (Signed)
History and Physical Interval Note:  09/05/2021 1:40 PM  [name]  has presented today for surgery, with the diagnosis of HEMORRHOIDS GRADE 2 AND LARGE EXTERNAL HEMORRHOIDS WITH BLEEDING AND PAIN.  The various methods of treatment have been discussed with the patient and family. After consideration of risks, benefits and other options for treatment, the patient has consented to  Procedure(s): HEMORRHOIDECTOMY WITH LIGATION AND HEMORRHOIDOPEXY (N/A) ANORECTAL EXAM UNDER ANESTHESIA (N/A) as a surgical intervention.  The patient's history has been reviewed, patient examined, no change in status, stable for surgery.  I have reviewed the patient's chart and labs.  Questions were answered to the patient's satisfaction.    I have re-reviewed the the patient's records, history, medications, and allergies.  I have re-examined the patient.  I again discussed intraoperative plans and goals of post-operative recovery.  The patient agrees to proceed.  [name]  [birth year] [id number]  Patient Care Team: Debbrah Alar, NP as PCP - General (Internal Medicine) Jerline Pain, MD as PCP - Cardiology (Cardiology) Deboraha Sprang, MD as PCP - Electrophysiology (Cardiology) Letta Pate Luanna Salk, MD as Consulting Physician (Physical Medicine and Rehabilitation) Penni Bombard, MD as Consulting Physician (Neurology) Larey Dresser, MD as Consulting Physician (Cardiology) Lavena Bullion, DO as Consulting Physician (Gastroenterology)  Patient Active Problem List   Diagnosis Date Noted   Macular pucker, left eye 04/11/2021   Iron deficiency anemia 03/04/2021   Internal hemorrhoids 03/04/2021   Pain of right thumb 03/04/2021   Seborrheic eczema of scalp 01/29/2021   2019 novel coronavirus disease (COVID-19) 11/25/2020   Class 1 obesity 11/25/2020   Normocytic anemia 11/25/2020   Macular pucker, right eye 08/06/2020   Hx of adenomatous colonic polyps    Adenomatous polyp of  ascending colon    Rectal varices    Primary osteoarthritis of first carpometacarpal joint of right hand 08/09/2019   Secondary hypercoagulable state ([place]) 08/02/2019   External hemorrhoid 05/19/2019   Chest wall hematoma 12/16/2018   Delirium 12/16/2018   Blood loss anemia 12/16/2018   Dysuria 12/16/2018   Acute encephalopathy 12/16/2018   Chronic diastolic CHF (congestive heart failure) (HCC)    Elevated troponin    Congestive heart failure (HCC)    Abnormal INR    AKI (acute kidney injury) ([place])    History of gout    History of hypothyroidism    History of type 2 diabetes mellitus    Atrial fibrillation ([place]) 11/27/2018   Ischemic chest pain ([place]) 02/17/2018   Pure hypercholesterolemia 05/01/2017   Thoracic aorta atherosclerosis ([place]) 04/27/2017   Paroxysmal atrial fibrillation ([place]) 12/06/2016   Dyspnea on exertion 10/21/2016   Acute respiratory failure with hypoxia and hypercapnia ([place]) 12/12/2015   Coronary 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 ([place]) 11/04/2015   CAD S/P CFX DES 2011 04/25/2015   Longstanding persistent atrial fibrillation ([place]) 04/25/2015   Atypical chest pain 04/24/2015   Constipation 04/24/2015   Chronic diastolic heart failure ([place]) 04/24/2015   B12 deficiency 08/11/2013   Sensory disturbance 08/11/2013   Ataxia 08/10/2013   Insomnia 05/21/2013   Spastic hemiplegia affecting dominant side ([place]) 07/21/2011   Long term current use of anticoagulant therapy 06/24/2011   Hypogonadism male 04/10/2011   Allergic rhinitis 02/13/2010   BENIGN POSITIONAL VERTIGO 11/23/2009   Hx of CABG '04 05/04/2009   Hypothyroidism 04/02/2009   Back pain 01/29/2009   Type 2 diabetes mellitus without complication, without long-term current use of insulin ([place]) 10/05/2007  Obstructive sleep apnea-Failed CPAP 06/17/2007   Hyperlipidemia 12/12/2006   Essential hypertension 12/12/2006   History of stroke with residual deficit 12/12/2006   GERD  12/12/2006    Past Medical History:  Diagnosis Date   Acute encephalopathy 12/12/2015   Acute respiratory failure with hypoxia and hypercapnia (HCC) 12/12/2015   Allergic rhinitis 02/13/2010   Qualifier: Diagnosis of  By: Wynona Luna    Angina at rest [facility]) 10/22/2015   Arthritis    Ataxia 08/10/2013   Atrial fibrillation ([place]) 11/2018   B12 deficiency 08/11/2013   BACK PAIN, LUMBAR 01/29/2009   Qualifier: Diagnosis of  By: Wynona Luna    BENIGN POSITIONAL VERTIGO 11/23/2009   Qualifier: Diagnosis of  By: Wynona Luna    Chronic diastolic heart failure ([place]) 04/24/2015   Chronic pain    left sided-[place]   Coronary 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 ([place]) 11/04/2015   s/p CABG 2004; CFX DES 2011   Essential hypertension 12/12/2006   Qualifier: Diagnosis of  By: [name] RMA, [place]     GERD 12/12/2006   Qualifier: Diagnosis of  By: Reatha Armour, [place]     GOUT 12/12/2006   Qualifier: Diagnosis of  By: [name] RMA, [place]     Hemorrhoids    Hyperlipidemia LDL goal <70 12/12/2006   Qualifier: Diagnosis of  By: [name] RMA, [place]      Hypogonadism male 04/10/2011   Hypothyroidism, acquired 02/15/2013   Insomnia 05/21/2013   Long term current use of anticoagulant therapy 06/24/2011   Myocardial infarction ([place])    Obstructive sleep apnea-Failed CPAP 06/17/2007   Failed CPAP Using O 2 for sleep    Paroxysmal atrial fibrillation ([place]) 12/06/2016   Stroke (cerebrum) ([place]) 01/13/2017   also 1996; resultant hemiplegia of dominant side   Thoracic aorta atherosclerosis ([place]) 04/27/2017   Tubular adenoma of colon 05/2010   Type 2 diabetes mellitus without complication, without long-term current use of insulin ([place]) 10/05/2007   Qualifier: Diagnosis of  By: Wynona Luna    Vasovagal syncope    Vasovagal syncope    Wears glasses     Past Surgical History:  Procedure Laterality Date   CARDIAC CATHETERIZATION N/A 10/25/2015   Procedure: Left Heart  Cath and Cors/Grafts Angiography;  Surgeon: Troy Sine, MD;  Location: [facility];  Service: Cardiovascular;  Laterality: N/A;   CARDIOVERSION  06/20/2011   Procedure: CARDIOVERSION;  Surgeon: Larey Dresser, MD;  Location: [facility];  Service: Cardiovascular;  Laterality: N/A;   CARDIOVERSION N/A 04/26/2015   Procedure: CARDIOVERSION;  Surgeon: Dorothy Spark, MD;  Location: [facility];  Service: Cardiovascular;  Laterality: N/A;   CARDIOVERSION N/A 05/10/2015   Procedure: CARDIOVERSION;  Surgeon: Larey Dresser, MD;  Location: [facility];  Service: Cardiovascular;  Laterality: N/A;   CARDIOVERSION N/A 12/10/2018   Procedure: CARDIOVERSION;  Surgeon: Sanda Klein, MD;  Location: [facility];  Service: Cardiovascular;  Laterality: N/A;   CATARACT EXTRACTION  05/2010   left eye   CATARACT EXTRACTION  04/2010   right eye   COLONOSCOPY WITH PROPOFOL N/A 02/22/2020   Procedure: COLONOSCOPY WITH PROPOFOL;  Surgeon: Lavena Bullion, DO;  Location: [facility];  Service: Gastroenterology;  Laterality: N/A;   CORONARY ARTERY BYPASS GRAFT     stent   ESOPHAGOGASTRODUODENOSCOPY  03-25-2005   LEFT HEART CATH AND CORS/GRAFTS ANGIOGRAPHY N/A 02/19/2018   Procedure: LEFT HEART CATH AND CORS/GRAFTS ANGIOGRAPHY;  Surgeon: [place],  Ander Slade, MD;  Location: [facility];  Service: Cardiovascular;  Laterality: N/A;   NASAL SEPTUM SURGERY     PERCUTANEOUS PLACEMENT INTRAVASCULAR STENT CERVICAL CAROTID ARTERY     03-2009; using a drug-eluting platform of the circumflex cornoray artery with a 3.0 x 18 [employer] Promus drug-eluting platform post dilated to 3.75 with a noncompliant balloon.   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 Does have stairs, but patient doesn't use them. Pt has completed technical school   Social Determinants of Health   Financial Resource Strain: Not on file  Food Insecurity: Not on file  Transportation Needs: Not on file  Physical Activity: Not on file  Stress: Not on file  Social Connections: Not on file  Intimate Partner Violence: Not on file    Family History  Problem Relation Age of Onset   Lung cancer Father        deceased   Stroke Mother        deceased-MINISTROKES   Colon cancer Neg Hx    Esophageal cancer Neg Hx    Stomach cancer Neg Hx    Pancreatic cancer Neg Hx    Liver disease Neg Hx     Medications Prior to Admission  Medication Sig Dispense Refill Last Dose   albuterol (VENTOLIN HFA) 108 (90 Base) MCG/ACT inhaler Inhale 1-2 puffs into the lungs every 6 (six) hours as needed for wheezing or shortness of breath.   Past Month   allopurinol (ZYLOPRIM) 100 MG tablet Take 2 tablets (200 mg total) by mouth daily. 180 tablet 1 09/05/2021 at 0915   atorvastatin (LIPITOR) 40 MG tablet TAKE 1 TABLET BY MOUTH EVERY DAY 90 tablet 1 09/04/2021   benazepril (LOTENSIN) 10 MG tablet TAKE 1 TABLET (10 MG TOTAL) BY MOUTH DAILY. TAKE WITH ADDITIONAL 5MG TABLET OF BENAZEPRIL DAILY 90 tablet 1 09/04/2021   benazepril (LOTENSIN) 5 MG tablet TAKE 1 TABLET BY MOUTH DAILY WITH  10MG OF BENAZEPRIL TO EQUAL 15MG DAILY 90 tablet 1 09/04/2021   cyanocobalamin 1000 MCG tablet Take 2,000 mcg by mouth daily.   09/04/2021   diltiazem (CARDIZEM CD) 180 MG 24 hr capsule Take 1 capsule (180 mg total) by mouth daily. 90 capsule 3 09/05/2021 at 0915   dofetilide (TIKOSYN) 500 MCG capsule TAKE 1 CAPSULE BY MOUTH 2 TIMES DAILY. 180 capsule 3 09/05/2021 at 0915   ELIQUIS 5 MG TABS tablet TAKE 1 TABLET BY MOUTH TWICE A DAY 180 tablet 1 09/04/2021 at 0800   ferrous sulfate 325 (65  FE) MG tablet Take 1 tablet (325 mg total) by mouth 2 (two) times daily with a meal. 180 tablet 1 1/61/0960   folic acid (FOLVITE) 1 MG tablet Take 1 tablet (1 mg total) by mouth daily. 90 tablet 1 09/04/2021   furosemide (LASIX) 40 MG tablet TAKE 1 TABLET BY MOUTH EVERY DAY AS NEEDED 90 tablet 1 Past Week   gabapentin (NEURONTIN) 100 MG capsule TAKE 1 CAPSULE BY MOUTH AT BEDTIME. 90 capsule 1 09/04/2021   levothyroxine (SYNTHROID) 112 MCG tablet TAKE 1 TABLET DAILY BEFORE BREAKFAST. NEEDS OFFICE VISIT BEFORE ANY FURTHER REFILLS 90 tablet 0 09/05/2021 at 0915   metoprolol succinate (TOPROL-XL) 25 MG 24 hr tablet Take 1 tablet (25 mg total) by mouth daily. 90 tablet 3 09/05/2021 at 0915   Multiple Vitamins-Minerals (MULTIVITAMIN GUMMIES MENS PO) Take 2 tablets by mouth daily with breakfast.    09/04/2021   OXYGEN Inhale 2 L into the lungs at bedtime.   09/04/2021   pantoprazole (PROTONIX) 40 MG tablet TAKE 1 TABLET BY MOUTH EVERY DAY BEFORE BREAKFAST 90 tablet 0 09/05/2021 at 0915   sennosides-docusate sodium (SENOKOT-S) 8.6-50 MG tablet Take 1 tablet by mouth as needed for constipation.   09/04/2021   blood glucose meter kit and supplies KIT Dispense based on patient and insurance preference. Use up to four times daily as directed. (FOR ICD-9 250.00, 250.01). 102 each 0 supplies   calcium carbonate (TUMS - DOSED IN MG ELEMENTAL CALCIUM) 500 MG chewable tablet Chew 1 tablet by mouth daily as needed for indigestion or heartburn.    More than a month   Continuous Blood Gluc Receiver (FREESTYLE LIBRE 2 READER) DEVI Use as directed 1 each 0 supply   Continuous Blood Gluc Sensor (FREESTYLE LIBRE 14 DAY SENSOR) MISC USE AS DIRECTED 1 each 5 supply   fluticasone (FLONASE) 50 MCG/ACT nasal spray Place 1 spray into both nostrils daily. (Patient taking differently: Place 1 spray into both nostrils daily as needed for allergies.) 16 g 0 More than a month   glucose blood (ACCU-CHEK GUIDE) test strip USE AS DIRECTED UP TO 4 TIMES DAILY 100 strip 0 supply   nitroGLYCERIN (NITROSTAT) 0.4 MG SL tablet Place 1 tablet (0.4 mg total) under the tongue every 5 (five) minutes as needed for chest pain. Up to 3 doses 10 tablet 2 More than a month   traMADol (ULTRAM) 50 MG tablet TAKE 1 TABLET BY MOUTH TWICE A DAY 180 tablet 5 Unknown   traMADol (ULTRAM-ER) 200 MG 24 hr tablet TAKE 1 TABLET BY MOUTH EVERY DAY 90 tablet 5 Unknown    Current Facility-Administered Medications  Medication Dose Route Frequency Provider Last Rate Last Admin   bupivacaine liposome (EXPAREL) 1.3 % injection 266 mg  20 mL Infiltration Once Michael Boston, MD       Derrill Memo ON 09/06/2021] cefTRIAXone (ROCEPHIN) 2 g in sodium chloride 0.9 % 100 mL IVPB  2 g Intravenous On Call to OR Michael Boston, MD       Chlorhexidine Gluconate Cloth 2 % PADS 6 each  6 each Topical Once Michael Boston, MD       And   Chlorhexidine Gluconate Cloth 2 % PADS 6 each  6 each Topical Once Michael Boston, MD       feeding supplement (ENSURE PRE-SURGERY) liquid 296 mL  296 mL Oral Once Michael Boston, MD       lactated ringers infusion   Intravenous Continuous Myrtie Soman, MD 10 mL/hr at 09/05/21 1318 New  Bag at 09/05/21 1318     No Known Allergies  BP (!) 157/73 (BP Location: Right Arm)   Pulse (!) 56   Temp 98.3 F (36.8 C) (Oral)   Resp 15   Ht _0  (1.905 m)   Wt 111.1 kg   SpO2 98%   BMI 30.61 kg/m   Labs: Results for orders placed or performed during the hospital encounter of  09/05/21 (from the past 48 hour(s))  Glucose, capillary     Status: Abnormal   Collection Time: 09/05/21 12:40 PM  Result Value Ref Range   Glucose-Capillary 107 (H) 70 - 99 mg/dL    Comment: Glucose reference range applies only to samples taken after fasting for at least 8 hours.   *Note: Due to a large number of results and/or encounters for the requested time period, some results have not been displayed. A complete set of results can be found in Results Review.    Imaging / Studies: No results found.   Adin Hector, M.D., F.A.C.S. Gastrointestinal and Minimally Invasive Surgery [facility] [address], [address] [place], [place] [address] [phone] Main / Paging  09/05/2021 1:40 PM    Adin Hector

## 2021-09-05 NOTE — H&P (Signed)
09/05/2021    REFERRING PHYSICIAN: [name], *  Patient Care Team: [name], NP as PCP - General (Family Medicine) Johney Maine, Adrian Saran, MD as Consulting Provider (General Surgery) Nikki Dom, MD (Cardiovascular Disease) Gerrit Heck, DO (Gastroenterology)  PROVIDER: Hollace Kinnier, MD  [facility] MRN: [id number] DOB: 05-29-1945  SUBJECTIVE   Chief Complaint: New Consultation (hemorrhoids)   History of Present Illness: [name] is a 76 y.o. male who is seen today  as an office consultation at the request of Dr. Inda Castle  for evaluation of New Consultation (hemorrhoids) .  With multiple medical problems as noted below. Has struggled with hemorrhoid irritation for many years. Takes Colace to move his bowels about every other day. Can be irritating. He is chronically anticoagulated on Eliquis for history of heart attack and stroke. Notes occasional blood when he wiped but no major bleeding. Biggest challenge is that he has a large hemorrhoid on the outside that is irritating and hard to keep clean. Mention to his primary care office. Consultation requested.  Patient had a colonoscopy in 2021 where some rectal varices hemorrhoids and external skin tags were noted by Dr. Bryan Lemma. Surgical consultation offered. Looks like Dr. Zenia Resides may have been requested but office visit did not happen. Patient complained of hemorrhoids to primary nurse practitioner, Debbrah Alar, in December. She gave our number. Looks like he focused on getting his carpal tunnel surgery done first by Dr. Burney Gauze. I think there was records sent to [place] in the winter but for some reason appointment not set up until this month.  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  Patient comes today with his wife. Uses a walker. I think he is increasingly frustrated with his hemorrhoids bothering him and wishes to get something more aggressively done.  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 (CMS-HCC)   Chronic anticoagulation   Past Surgical History:  Procedure Laterality Date   CORONARY ARTERY BYPASS GRAFT   VASCULAR SURGERY    No Known Allergies  Current Outpatient Medications on File Prior to Visit  Medication Sig Dispense Refill   allopurinoL (ZYLOPRIM) 100 MG tablet Take by mouth   apixaban (ELIQUIS) 5 mg tablet Take by mouth   atorvastatin (LIPITOR) 40 MG tablet Take 1 tablet by mouth once daily   dilTIAZem (TIAZAC) 180 MG ER capsule Take by mouth   dofetilide (TIKOSYN) 500 MCG capsule Take by mouth   fluticasone propionate (FLONASE) 50 mcg/actuation nasal spray Place into one nostril   folic acid (FOLVITE) 1 MG tablet Take by mouth   FUROsemide (LASIX) 40 MG tablet Take by mouth   gabapentin (NEURONTIN) 100 MG capsule Take 1 capsule by mouth at bedtime   levothyroxine (SYNTHROID) 112 MCG tablet Take by mouth   metoprolol succinate (TOPROL-XL) 25 MG XL tablet Take 1 tablet by  mouth once daily   benazepriL (LOTENSIN) 10 MG tablet TAKE 1 TABLET (10 MG TOTAL) BY MOUTH DAILY. TAKE WITH ADDITIONAL '5MG'$  TABLET OF BENAZEPRIL DAILY   calcium carbonate (TUMS) 200 mg calcium (500 mg) chewable tablet Take by mouth   nitroGLYcerin (NITROSTAT) 0.4 MG SL  tablet Place 0.4 mg under the tongue every 5 (five) minutes as needed   pantoprazole (PROTONIX) 40 MG DR tablet Take 40 mg by mouth every morning before breakfast (0630)   traMADoL (ULTRAM) 50 mg tablet Take 50 mg by mouth 2 (two) times daily   No current facility-administered medications on file prior to visit.   Family History  Problem Relation Age of Onset   Coronary Artery Disease (Blocked arteries around heart) Mother   Coronary Artery Disease (Blocked arteries around heart) Father   Coronary Artery Disease (Blocked arteries around heart) Brother    Social History   Tobacco Use  Smoking Status Never  Smokeless Tobacco Never    Social History   Socioeconomic History   Marital status: Married  Tobacco Use   Smoking status: Never   Smokeless tobacco: Never  Vaping Use   Vaping Use: Never used  Substance and Sexual Activity   Alcohol use: Not Currently   Drug use: Never  Social History Narrative  Patient originally from [place] area.   Relocated to [place] in mid 2000's.   Married. Uses walker.   ############################################################  Review of Systems: A complete review of systems (ROS) was obtained from the patient. I have reviewed this information and discussed as appropriate with the patient. See HPI as well for other pertinent ROS.  Constitutional: No fevers, chills, sweats. Weight stable Eyes: No vision changes, No discharge HENT: No sore throats, nasal drainage Lymph: No neck swelling, No bruising easily Pulmonary: No cough, productive sputum CV: No orthopnea, PND . No exertional chest/neck/shoulder/arm pain. Patient can walk 100 feet with a walker at most gradually.   GI: No personal nor family history of GI/colon cancer, inflammatory bowel disease, irritable bowel syndrome, allergy such as Celiac Sprue, dietary/dairy problems, colitis, ulcers nor gastritis. No recent sick contacts/gastroenteritis. No travel outside the country.  No changes in diet.  Renal: No UTIs, No hematuria Genital: No drainage, bleeding, masses Musculoskeletal: No severe joint pain. Good ROM major joints right side Skin: No sores or lesions Heme/Lymph: No easy bleeding. No swollen lymph nodes Neuro: Left-sided spasticity/rigidity from stroke. Uses walker. Some mild balance issues. No active seizures. No facial droop Psych: No hallucinations. No agitation  OBJECTIVE   Vitals:  08/14/21 1106  BP: 132/80  Pulse: 72  SpO2: 92%  Weight: (!) 111.3 kg (245 lb 6.4 oz)  Height: 190.5 cm ('6\' 3"'$ )  Body mass index is 30.67 kg/m.  BP (!) 157/73 (BP Location: Right Arm)   Pulse (!) 56   Temp 98.3 F (36.8 C) (Oral)   Resp 15   Ht '6\' 3"'$  (1.905 m)   Wt 111.1 kg   SpO2 98%   BMI 30.61 kg/m  09/05/2021   PHYSICAL EXAM:  Constitutional: Not cachectic. Hygeine adequate. Vitals signs as above.  Eyes: Wears glasses - vision corrected,Pupils reactive, normal extraocular movements. Sclera nonicteric Neuro: CN II-XII intact. Some slow to answer and mild stuttering. Some moderate spasticity/rigidity of left shoulder and knee/left-sided especially. . Lymph: No head/neck/groin lymphadenopathy Psych: No severe agitation -slightly annoyed at the end of but mostly consolable.. No severe anxiety. Judgment & insight Adequate, Oriented x4, HENT: Normocephalic, Mucus membranes moist. No thrush. Hearing: adequate  Neck: Supple, No tracheal deviation. No obvious thyromegaly Chest: No pain to chest wall compression. Good respiratory excursion. No audible wheezing CV: Pulses intact. regular. No major extremity edema Ext: No obvious deformity or contracture. Edema: Not present. No cyanosis Skin: No major subcutaneous nodules. Warm and dry Musculoskeletal: Severe joint rigidity Noted at Left:shoulder elbow knee and hip. No obvious clubbing. No digital petechiae. Mobility: uses rolling walker moves but needs assisting  Abdomen: Obese Soft. Nondistended.  Nontender. Hernia: Not present. Diastasis recti: Mild supraumbilical midline. No hepatomegaly. No splenomegaly.  Genital/Pelvic: Inguinal hernia: Not present. Inguinal lymph nodes: without lymphadenopathy nor hidradenitis.   Rectal:   ##################################  Perianal skin Mild fecal soiling  Pruritis ani: Not present Pilonidal disease: Not present Condyloma / warts: Not present  Anal fissure: Not present Perirectal abscess/fistula Not present External hemorrhoids large right-sided external hemorrhoid skin fold. Mild irritation.  Digital and anoscopic rectal exam tolerated  Sphincter tone Mildly decreased squeeze  Hemorrhoidal piles Grade 2 internal at 3 piles. Prostate: Enlarged but smooth Rectovaginal septum: N/A Rectal masses: Not present  Other significant findings: N/A  Patient examined with assistance of male Medical Assistant in the room with patient in decubitus position .  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  Struggling with hemorrhoids especially externally with irritation and hygiene issue/ concerns.  Standard of care is to do outpatient surgery with internal  hemorrhoidal itching pexy and excision of external hemorrhoid.   The anatomy & physiology of the anorectal region was discussed. The pathophysiology of hemorrhoids and differential diagnosis was discussed. Natural history risks without surgery was discussed. I stressed the importance of a bowel regimen to have daily soft bowel movements to minimize progression of disease. Interventions such as sclerotherapy & banding were discussed.  The patient's symptoms are not adequately controlled by medicines and other non-operative treatments. I feel the risks & problems of no surgery outweigh the operative risks; therefore, I recommended surgery to treat the hemorrhoids by ligation, pexy, and possible resection.  Risks such as bleeding, infection, urinary difficulties, injury to other organs, need for repair of tissues / organs, need for further treatment, heart attack, death, and other risks were discussed. I noted a good likelihood this will help address the problem. Goals of post-operative recovery were discussed as well. Possibility that this will not correct all symptoms was explained. Post-operative pain, bleeding, constipation, and other problems after surgery were discussed. We will work to minimize complications. Educational handouts further explaining the pathology, treatment options, and bowel regimen were given as well. Questions were answered. The patient expresses understanding & wishes to proceed with surgery.   Normally can be an outpatient surgery. His operative risks are increased given his neurological and cardiac issues. He tolerated colonoscopy & carpal tunnel release although admittedly those were just with sedation and local.  I would need to make sure from his  cardiologist they feel it safe to proceed with surgery. I recommended obtaining preoperative cardiac clearance. I am concerned about the health of the patient and the ability to tolerate the operation. Therefore, we will request  clearance by cardiology to better assess operative risk & see if a reevaluation, further workup, adjustment to medications, etc is needed. Usually we hold Eliquis for 2 days, do surgery, start Eliquis the next day. However last time they rec'd holding one day and then restarting the next day. If they feel strongly he needs to stay on complete anticoagulation, we can proceed and note there is increased bleeding risk. We will see.  He noted he was very frustrated and want this dealt with the soonest possible. I pushed back noting it is safer to delay things a little bit to make sure cardiology can reevaluate and make sure that the plan for surgery is reasonable and that we are doing everything to be safe. However I will try to avoid too much delay. That seemed to somewhat satisfy him.    Adin Hector, MD, FACS, MASCRS Esophageal, Gastrointestinal & Colorectal Surgery Robotic and Minimally Invasive Surgery  [facility], [place]  [place] [place], [place] [address] [phone] Fax [phone] Main  CONTACT INFORMATION:  Weekday (9AM-5PM): Call [facility] main office at [phone]  Weeknight (5PM-9AM) or Weekend/Holiday: Check [email or web address] (password " [email or web address]") for General Surgery [facility] coverage  (Please, do not use SecureChat as it is not reliable communication to operating surgeons for immediate patient care)    09/05/2021

## 2021-09-05 NOTE — Discharge Instructions (Signed)
##############################################  ANORECTAL SURGERY:  POST OPERATIVE INSTRUCTIONS  ######################################################################  EAT Start with a pureed / full liquid diet After 24 hours, gradually transition to a high fiber diet.    CONTROL PAIN Control pain so you can tolerate bowel movements,  walk, sleep, tolerate sneezing/coughing, and go up/down stairs.   HAVE A BOWEL MOVEMENT DAILY Keep your bowels regular to avoid problems.   Taking a fiber supplement every day to keep bowels soft.   Try a laxative to override constipation. Use an antidairrheal to slow down diarrhea.   Call if not better after 2 tries  WALK Walk an hour a day.  Control your pain to do that.   CALL IF YOU HAVE PROBLEMS/CONCERNS Call if you are still struggling despite following these instructions. Call if you have concerns not answered by these instructions  ######################################################################    Take your usually prescribed home medications unless otherwise directed.  DIET: Follow a light bland diet & liquids the first 24 hours after arrival home, such as soup, liquids, starches, etc.  Be sure to drink plenty of fluids.  Quickly advance to a usual solid diet within a few days.  Avoid fast food or heavy meals as your are more likely to get nauseated or have irregular bowels.  A low-fat, high-fiber diet for the rest of your life is ideal.  PAIN CONTROL: Expect swelling and discomfort in the anus/rectal area. Pain is best controlled by a usual combination of many methods TOGETHER: Warm baths/soaks or Ice packs Over the counter pain medication Prescription pain medications Topical creams    Warm water baths or ice packs (30-60 minutes up to 8 times a day, especially after bowel meovements) will help. Use ice for the first few days to help decrease swelling and bruising, then switch to heat such as warm towels, sitz baths, warm  baths, warm showers, etc to help relax tight/sore spots and speed recovery.  Some people prefer to use ice alone, heat alone, alternating between ice & heat.  Experiment to what works for you.    It is helpful to take an over-the-counter pain medication continuously for the first few weeks.  Choose one of the following that works best for you: Naproxen (Aleve, etc)  Two 220mg tabs twice a day Ibuprofen (Advil, etc) Three 200mg tabs four times a day (every meal & bedtime) Acetaminophen (Tylenol, etc) 500-650mg four times a day (every meal & bedtime)  A  prescription for pain medication (such as oxycodone, hydrocodone, etc) should be given to you upon discharge.  Take your pain medication as prescribed.  If you are having problems/concerns with the prescription medicine (does not control pain, nausea, vomiting, rash, itching, etc), please call us [phone] to see if we need to switch you to a different pain medicine that will work better for you and/or control your side effect better. If you need a refill on your pain medication, please contact your pharmacy.  They will contact our office to request authorization. Prescriptions will not be filled after 5 pm or on week-ends.  If can take up to 48 hours for it to be filled & ready so avoid waiting until you are down to thel ast pill.  A topical cream (Dibucaine) or a prescription for a cream (such as diltiazem 2% gel) may be given to you.  Many people find relief with topical creams.  Some people find it burns too much.  Experiment.  If it helps, use it.  If it burns, don't   using it.  You also may receive a prescription for diazepam, a muscle relaxant to help you to be able to urinate and defecate more easily.  It is safe to take a few doses with the other medications as long as you are not planning to drive or do anything intense.  Hopefully this can minimize the chance of needing a Foley catheter into your bladder     KEEP YOUR BOWELS  REGULAR The goal is one soft bowel movement a day Avoid getting constipated.  Between the surgery and the pain medications, it is common to experience some constipation.  Increasing fluid intake and taking a fiber supplement (such as Metamucil, Citrucel, FiberCon, MiraLax, etc) 2-4 times a day regularly will usually help prevent this problem from occurring.  A mild laxative (prune juice, Milk of Magnesia, MiraLax, etc) should be taken according to package directions if there are no bowel movements after 48 hours. Watch out for diarrhea.  If you have many loose bowel movements, simplify your diet to bland foods & liquids for a few days.  Stop any stool softeners and decrease your fiber supplement.  Switching to mild anti-diarrheal medications (Kayopectate, Pepto Bismol) can help.  Can try an imodium/loperamide dose.  If this worsens or does not improve, please call us.  Wound Care   a. You have some fluffed gauze on top of the anus to help catch drainage and bleeding.  Let the gauze fall off with the first bowel movement or shower.  It is okay to reinforce or replace as needed.  Bleeding is common at first and occasionally tapers off   b. Place soft cotton balls on the anus/wounds and use an absorbent pad in your underwear as needed to catch any drainage and help keep the area.  Try to use cotton balls or pads over regular gauze or toilet paper as gauze will stick and pull, causing pain.  Cotton will come off more easily.   c. Keep the area clean and dry.  Bathe / shower every day.  Keep the area clean by showering / bathing over the incision / wound.   It is okay to soak an open wound to help wash it.  Consider using a squeeze bottle filled with warm water to gently wash the anal area.  Wet wipes or showers / gentle washing after bowel movements is often less traumatic than regular toilet paper.  Use a Sitz Bath 4-8 times a day for relief  A sitz bath is a warm water bath taken in the sitting position  that covers only the hips and buttocks. It may be used for either healing or hygiene purposes. Sitz baths are also used to relieve pain, itching, or muscle spasms.  Gently cleaned the area and the heat will help lower spasm and offer better pain control.    Fill the bathtub half full with warm water. Sit in the water and open the drain a little. Turn on the warm water to keep the tub half full. Keep the water running constantly. Soak in the water for 15 to 20 minutes. After the sitz bath, pat the affected area dry first.   d. You will often notice bleeding, especially with bowel movements.  This should slow down by the end of the first week of surgery.  Sitting on an ice pack can help.   e. Expect some drainage.  You often will have some blood or yellow drainage with open wounds.  Sometimes she will get a little   leaking of liquid stool until the incision/wounds have fully close down.  This should slow down by the end of the first week of surgery, but you will have occasional bleeding or drainage up to a few months after surgery.  Wear an absorbent pad or soft cotton gauze in your underwear until the drainage stops.  ACTIVITIES as tolerated:    You may resume regular (light) daily activities beginning the next day--such as daily self-care, walking, climbing stairs--gradually increasing activities as tolerated.  If you can walk 30 minutes without difficulty, it is safe to try more intense activity such as jogging, treadmill, bicycling, low-impact aerobics, swimming, etc. Save the most intensive and strenuous activity for last such as sit-ups, heavy lifting, contact sports, etc  Refrain from any heavy lifting or straining until you are off narcotics for pain control.   DO NOT PUSH THROUGH PAIN.  Let pain be your guide: If it hurts to do something, don't do it.  Pain is your body warning you to avoid that activity for another week until the pain goes down. You may drive when you are no longer taking  prescription pain medication, you can comfortably sit for long periods of time, and you can safely maneuver your car and apply brakes. You may have sexual intercourse when it is comfortable.   FOLLOW UP in our office Please call [facility] at [phone] to set up an appointment to see your surgeon in the office for a follow-up appointment approximately 3 weeks after your surgery. Make sure that you call for this appointment the day you arrive home to ensure a convenient appointment time.  8. IF YOU HAVE DISABILITY OR FAMILY LEAVE FORMS, BRING THEM TO THE OFFICE FOR PROCESSING.  DO NOT GIVE THEM TO YOUR DOCTOR.        WHEN TO CALL US [phone]: Poor pain control Reactions / problems with new medications (rash/itching, nausea, etc)  Fever over 101.5 F (38.5 C) Inability to urinate Nausea and/or vomiting Worsening swelling or bruising Continued bleeding from incision. Increased pain, redness, or drainage from the incision  The clinic staff is available to answer your questions during regular business hours (8:30am-5pm).  Please don't hesitate to call and ask to speak to one of our nurses for clinical concerns.   A surgeon from [facility] is always on call at the hospitals   If you have a medical emergency, go to the nearest emergency room or call 911.    [facility] [address], [address], [place], [place]  [address] ? MAIN: [phone] ? TOLL FREE: [phone] ? FAX [phone] [email or web address]  #####################################################  

## 2021-09-05 NOTE — Transfer of Care (Signed)
Immediate Anesthesia Transfer of Care Note  Patient: [name]  Procedure(s) Performed: HEMORRHOIDECTOMY WITH LIGATION AND HEMORRHOIDOPEXY (Rectum) ANORECTAL EXAM UNDER ANESTHESIA  Patient Location: PACU  Anesthesia Type:General  Level of Consciousness: awake, alert  and oriented  Airway & Oxygen Therapy: Patient Spontanous Breathing and Patient connected to face mask oxygen  Post-op Assessment: Report given to RN and Post -op Vital signs reviewed and stable  Post vital signs: Reviewed and stable  Last Vitals:  Vitals Value Taken Time  BP 137/65 09/05/21 1554  Temp    Pulse 64 09/05/21 1555  Resp 17 09/05/21 1555  SpO2 100 % 09/05/21 1555  Vitals shown include unvalidated device data.  Last Pain:  Vitals:   09/05/21 1308  TempSrc:   PainSc: 0-No pain         Complications: No notable events documented.

## 2021-09-05 NOTE — Anesthesia Procedure Notes (Signed)
Procedure Name: Intubation Date/Time: 09/05/2021 2:34 PM  Performed by: Genelle Bal, CRNAPre-anesthesia Checklist: Patient identified, Emergency Drugs available, Suction available and Patient being monitored Patient Re-evaluated:Patient Re-evaluated prior to induction Oxygen Delivery Method: Circle system utilized Preoxygenation: Pre-oxygenation with 100% oxygen Induction Type: IV induction Ventilation: Mask ventilation without difficulty Laryngoscope Size: Glidescope and 3 Grade View: Grade I Tube type: Oral Tube size: 7.5 mm Number of attempts: 1 Airway Equipment and Method: Stylet Placement Confirmation: ETT inserted through vocal cords under direct vision, positive ETCO2 and breath sounds checked- equal and bilateral Secured at: 23 cm Tube secured with: Tape Dental Injury: Teeth and Oropharynx as per pre-operative assessment  Comments: Patient with limited neck mobility following stroke and required Glidescope for view

## 2021-09-05 NOTE — Anesthesia Postprocedure Evaluation (Signed)
Anesthesia Post Note  Patient: [name]  Procedure(s) Performed: HEMORRHOIDECTOMY WITH LIGATION AND HEMORRHOIDOPEXY (Rectum) ANORECTAL EXAM UNDER ANESTHESIA     Patient location during evaluation: PACU Anesthesia Type: General Level of consciousness: awake and alert Pain management: pain level controlled Vital Signs Assessment: post-procedure vital signs reviewed and stable Respiratory status: spontaneous breathing, nonlabored ventilation, respiratory function stable and patient connected to nasal cannula oxygen Cardiovascular status: blood pressure returned to baseline and stable Postop Assessment: no apparent nausea or vomiting Anesthetic complications: no   No notable events documented.  Last Vitals:  Vitals:   09/05/21 1652 09/05/21 1653  BP: (!) 148/78 (!) 148/78  Pulse: (!) 50   Resp: 18 14  Temp: (!) 36.4 C   SpO2: 97% 97%    Last Pain:  Vitals:   09/05/21 1652  TempSrc: Oral  PainSc: 0-No pain                 [name]

## 2021-09-06 ENCOUNTER — Encounter (HOSPITAL_COMMUNITY): Payer: Self-pay | Admitting: Surgery

## 2021-09-07 ENCOUNTER — Other Ambulatory Visit: Payer: Self-pay | Admitting: Family

## 2021-09-09 LAB — SURGICAL PATHOLOGY

## 2021-09-10 ENCOUNTER — Encounter: Payer: Medicare Other | Admitting: Physical Medicine & Rehabilitation

## 2021-09-15 ENCOUNTER — Other Ambulatory Visit: Payer: Self-pay | Admitting: Physical Medicine & Rehabilitation

## 2021-10-01 ENCOUNTER — Telehealth: Payer: Self-pay | Admitting: Family

## 2021-10-01 ENCOUNTER — Other Ambulatory Visit: Payer: Self-pay | Admitting: Family

## 2021-10-01 NOTE — Telephone Encounter (Signed)
Please contact pt to schedule a follow up appointment.  °

## 2021-10-02 NOTE — Telephone Encounter (Signed)
Both phone numbers do not have a vm option, just kept ringing. Last mychart login was 2019.

## 2021-10-09 ENCOUNTER — Other Ambulatory Visit: Payer: Self-pay | Admitting: Cardiology

## 2021-10-09 ENCOUNTER — Other Ambulatory Visit: Payer: Self-pay | Admitting: Family

## 2021-10-09 DIAGNOSIS — I4811 Longstanding persistent atrial fibrillation: Secondary | ICD-10-CM

## 2021-10-09 NOTE — Telephone Encounter (Signed)
Prescription refill request for Eliquis received. Indication:Afib  Last office visit:08/19/21 ([facility])  Scr: 0.93 (08/28/21) Age: 76 Weight: 111.1kg  Appropriate dose and refill sent to requested pharmacy.

## 2021-10-12 ENCOUNTER — Other Ambulatory Visit: Payer: Self-pay | Admitting: Family

## 2021-10-12 DIAGNOSIS — Z8639 Personal history of other endocrine, nutritional and metabolic disease: Secondary | ICD-10-CM

## 2021-10-25 ENCOUNTER — Encounter: Payer: Medicare Other | Attending: Physical Medicine & Rehabilitation | Admitting: Physical Medicine & Rehabilitation

## 2021-10-25 DIAGNOSIS — G8112 Spastic hemiplegia affecting left dominant side: Secondary | ICD-10-CM | POA: Insufficient documentation

## 2021-11-01 ENCOUNTER — Encounter: Payer: Self-pay | Admitting: Physical Medicine & Rehabilitation

## 2021-11-01 ENCOUNTER — Encounter (HOSPITAL_BASED_OUTPATIENT_CLINIC_OR_DEPARTMENT_OTHER): Payer: Medicare Other | Admitting: Physical Medicine & Rehabilitation

## 2021-11-01 VITALS — BP 138/78 | HR 66 | Temp 98.0°F | Ht 75.0 in | Wt 236.0 lb

## 2021-11-01 DIAGNOSIS — G8112 Spastic hemiplegia affecting left dominant side: Secondary | ICD-10-CM

## 2021-11-01 DIAGNOSIS — Z9861 Coronary angioplasty status: Secondary | ICD-10-CM

## 2021-11-01 DIAGNOSIS — I2510 Atherosclerotic heart disease of native coronary artery without angina pectoris: Secondary | ICD-10-CM | POA: Diagnosis not present

## 2021-11-01 NOTE — Progress Notes (Signed)
Subjective:    Patient ID: [name], male    DOB: 08-21-1945, 76 y.o.   MRN: [id number]  HPI A lot of post op pain  after hemorrhoidectomy 09/05/21 ,still recovering .  He has a history of right CVA causing left spastic hemiplegia and has required Botox injection for spasticity management.  In addition he has lumbar spinal stenosis with chronic pain and takes tramadol 200 mg extended release per day as well as 50 mg p.o. twice daily.  He has 67-monthsupply of medication i.e. 90-day supply with 1 refill. He feels like his left arm is still doing fairly well after last botulinum toxin injection however the left leg is getting tighter again.  He has difficulty extending his left lower extremity due to tightness behind the knee Botox 07/30/21  left FDP 25 units  left FDS 25 units left biceps 50 units Hamstrings200  LUE doing ok  Chronic low back pain unchanged  Pain Inventory Average Pain 7 Pain Right Now 5 My pain is intermittent and tingling  In the last 24 hours, has pain interfered with the following? General activity 6 Relation with others 0 Enjoyment of life 5 What TIME of day is your pain at its worst? daytime Sleep (in general) Fair  Pain is worse with: standing Pain improves with: medication Relief from Meds: 9  Family History  Problem Relation Age of Onset   Lung cancer Father        deceased   Stroke Mother        deceased-MINISTROKES   Colon cancer Neg Hx    Esophageal cancer Neg Hx    Stomach cancer Neg Hx    Pancreatic cancer Neg Hx    Liver disease Neg Hx    Social History   Socioeconomic History   Marital status: Married    Spouse name: GPeter Congo  Number of children: Not on file   Years of education: 14   Highest education level: Not on file  Occupational History   Occupation: retired    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 Does have stairs, but patient doesn't use them. Pt has completed technical school   Social Determinants of Health   Financial Resource Strain: Not on file  Food Insecurity: Not on file  Transportation Needs: Not on file  Physical Activity: Not on file  Stress: Not on file  Social Connections: Not on file   Past Surgical History:  Procedure Laterality Date   CARDIAC CATHETERIZATION N/A 10/25/2015   Procedure: Left Heart Cath and Cors/Grafts Angiography;  Surgeon: TTroy Sine MD;  [facility];  Service: Cardiovascular;  Laterality: N/A;   CARDIOVERSION  06/20/2011   Procedure: CARDIOVERSION;  Surgeon: DLarey Dresser MD;  [facility];  Service: Cardiovascular;  Laterality: N/A;   CARDIOVERSION N/A 04/26/2015   Procedure: CARDIOVERSION;  Surgeon: KDorothy Spark MD;  [facility]  Service: Cardiovascular;  Laterality: N/A;   CARDIOVERSION N/A 05/10/2015   Procedure: CARDIOVERSION;  Surgeon: DLarey Dresser MD;  [place]  Service: Cardiovascular;  Laterality: N/A;   CARDIOVERSION  N/A 12/10/2018   Procedure: CARDIOVERSION;  Surgeon: Sanda Klein, MD;  Location: [place] [facility];  Service: Cardiovascular;  Laterality: N/A;   CATARACT EXTRACTION  05/2010   left eye   CATARACT EXTRACTION  04/2010   right eye   COLONOSCOPY WITH PROPOFOL N/A 02/22/2020   Procedure: COLONOSCOPY WITH PROPOFOL;  Surgeon: Lavena Bullion, DO;  [facility];  Service: Gastroenterology;  Laterality: N/A;   CORONARY ARTERY BYPASS GRAFT     stent   ESOPHAGOGASTRODUODENOSCOPY  03-25-2005   HEMORRHOID SURGERY N/A 09/05/2021   Procedure: HEMORRHOIDECTOMY WITH LIGATION AND HEMORRHOIDOPEXY;  Surgeon: Michael Boston, MD;  [facility];  Service: General;   Laterality: N/A;   LEFT HEART CATH AND CORS/GRAFTS ANGIOGRAPHY N/A 02/19/2018   Procedure: LEFT HEART CATH AND CORS/GRAFTS ANGIOGRAPHY;  Surgeon: Martinique, Peter M, MD;  Location: [place] [facility];  Service: Cardiovascular;  Laterality: N/A;   NASAL SEPTUM SURGERY     PERCUTANEOUS PLACEMENT INTRAVASCULAR STENT CERVICAL CAROTID ARTERY     03-2009; using a drug-eluting platform of the circumflex cornoray artery with a 3.0 x 18 [employer] Promus drug-eluting platform post dilated to 3.75 with a noncompliant balloon.   POLYPECTOMY  02/22/2020   Procedure: POLYPECTOMY;  Surgeon: Lavena Bullion, DO;  [facility];  Service: Gastroenterology;;   RECTAL EXAM UNDER ANESTHESIA N/A 09/05/2021   Procedure: ANORECTAL EXAM UNDER ANESTHESIA;  Surgeon: Michael Boston, MD;  [facility];  Service: General;  Laterality: N/A;   TEE WITHOUT CARDIOVERSION  06/20/2011   Procedure: TRANSESOPHAGEAL ECHOCARDIOGRAM (TEE);  Surgeon: Larey Dresser, MD;  Location: [place];  Service: Cardiovascular;  Laterality: N/A;   Past Surgical History:  Procedure Laterality Date   CARDIAC CATHETERIZATION N/A 10/25/2015   Procedure: Left Heart Cath and Cors/Grafts Angiography;  Surgeon: Troy Sine, MD;  Location: [place] [facility];  Service: Cardiovascular;  Laterality: N/A;   CARDIOVERSION  06/20/2011   Procedure: CARDIOVERSION;  Surgeon: Larey Dresser, MD;  Location: [place];  Service: Cardiovascular;  Laterality: N/A;   CARDIOVERSION N/A 04/26/2015   Procedure: CARDIOVERSION;  Surgeon: Dorothy Spark, MD;  Location: [facility];  Service: Cardiovascular;  Laterality: N/A;   CARDIOVERSION N/A 05/10/2015   Procedure: CARDIOVERSION;  Surgeon: Larey Dresser, MD;  Location: [place];  Service: Cardiovascular;  Laterality: N/A;   CARDIOVERSION N/A 12/10/2018   Procedure: CARDIOVERSION;  Surgeon: Sanda Klein, MD;  Location: [place];  Service: Cardiovascular;  Laterality: N/A;   CATARACT  EXTRACTION  05/2010   left eye   CATARACT EXTRACTION  04/2010   right eye   COLONOSCOPY WITH PROPOFOL N/A 02/22/2020   Procedure: COLONOSCOPY WITH PROPOFOL;  Surgeon: Lavena Bullion, DO;  [facility];  Service: Gastroenterology;  Laterality: N/A;   CORONARY ARTERY BYPASS GRAFT     stent   ESOPHAGOGASTRODUODENOSCOPY  03-25-2005   HEMORRHOID SURGERY N/A 09/05/2021   Procedure: HEMORRHOIDECTOMY WITH LIGATION AND HEMORRHOIDOPEXY;  Surgeon: Michael Boston, MD;  [facility];  Service: General;  Laterality: N/A;   LEFT HEART CATH AND CORS/GRAFTS ANGIOGRAPHY N/A 02/19/2018   Procedure: LEFT HEART CATH AND CORS/GRAFTS ANGIOGRAPHY;  Surgeon: Martinique, Peter M, MD;  Location: [place] [facility];  Service: Cardiovascular;  Laterality: N/A;   NASAL SEPTUM SURGERY     PERCUTANEOUS PLACEMENT INTRAVASCULAR STENT CERVICAL CAROTID ARTERY     03-2009; using a drug-eluting platform of the circumflex cornoray artery with a 3.0 x 18 [employer] Promus drug-eluting platform post dilated to 3.75 with  a noncompliant balloon.   POLYPECTOMY  02/22/2020   Procedure: POLYPECTOMY;  Surgeon: Lavena Bullion, DO;  [facility];  Service: Gastroenterology;;   RECTAL EXAM UNDER ANESTHESIA N/A 09/05/2021   Procedure: ANORECTAL EXAM UNDER ANESTHESIA;  Surgeon: Michael Boston, MD;  [facility];  Service: General;  Laterality: N/A;   TEE WITHOUT CARDIOVERSION  06/20/2011   Procedure: TRANSESOPHAGEAL ECHOCARDIOGRAM (TEE);  Surgeon: Larey Dresser, MD;  Location: [facility];  Service: Cardiovascular;  Laterality: N/A;   Past Medical History:  Diagnosis Date   Acute encephalopathy 12/12/2015   Acute respiratory failure with hypoxia and hypercapnia ([place]) 12/12/2015   Allergic rhinitis 02/13/2010   Qualifier: Diagnosis of  By: Wynona Luna    Angina at rest [facility]) 10/22/2015   Arthritis    Ataxia 08/10/2013   Atrial fibrillation ([place]) 11/2018   B12 deficiency 08/11/2013   BACK PAIN, LUMBAR  01/29/2009   Qualifier: Diagnosis of  By: Wynona Luna    BENIGN POSITIONAL VERTIGO 11/23/2009   Qualifier: Diagnosis of  By: Wynona Luna    Chronic diastolic heart failure ([place]) 04/24/2015   Chronic pain    left sided-[place]   Coronary 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 ([place]) 11/04/2015   s/p CABG 2004; CFX DES 2011   Essential hypertension 12/12/2006   Qualifier: Diagnosis of  By: [name] RMA, [place]     GERD 12/12/2006   Qualifier: Diagnosis of  By: [name] RMA, [place]     GOUT 12/12/2006   Qualifier: Diagnosis of  By: [name] RMA, [place]     Hemorrhoids    Hyperlipidemia LDL goal <70 12/12/2006   Qualifier: Diagnosis of  By: [name] RMA, [place]      Hypogonadism male 04/10/2011   Hypothyroidism, acquired 02/15/2013   Insomnia 05/21/2013   Long term current use of anticoagulant therapy 06/24/2011   Myocardial infarction ([place])    Obstructive sleep apnea-Failed CPAP 06/17/2007   Failed CPAP Using O 2 for sleep    Paroxysmal atrial fibrillation ([place]) 12/06/2016   Stroke (cerebrum) ([place]) 01/13/2017   also 1996; resultant hemiplegia of dominant side   Thoracic aorta atherosclerosis ([place]) 04/27/2017   Tubular adenoma of colon 05/2010   Type 2 diabetes mellitus without complication, without long-term current use of insulin ([place]) 10/05/2007   Qualifier: Diagnosis of  By: Wynona Luna    Vasovagal syncope    Vasovagal syncope    Wears glasses    BP 138/78   Pulse 66   Temp 98 F (36.7 C)   Ht '6\' 3"'$  (1.905 m)   Wt 236 lb (107 kg)   SpO2 96%   BMI 29.50 kg/m   Opioid Risk Score:   Fall Risk Score:  `1  Depression screen PHQ 2/9     11/01/2021    1:24 PM 04/04/2021    9:20 AM 02/15/2021    9:58 AM 06/26/2020    3:10 PM 05/29/2020   11:16 AM 05/17/2020   11:57 AM 04/19/2020    9:53 AM  Depression screen PHQ 2/9  Decreased Interest  0 0 0 0 0 0  Down, Depressed, Hopeless 0  0 0 0 0   PHQ - 2 Score 0 0 0 0 0 0 0  Altered sleeping 0        PHQ-9  Score 0          Review of Systems  Musculoskeletal:  Positive for gait problem.  Pain in left arm, hand, foot, & leg  All other systems reviewed and are negative.      Objective:   Physical Exam  Well-developed well-nourished male no acute distress Speech without dysarthria Extraocular muscles intact Left upper extremity 4-/5 strength in the deltoid, bicep, tricep, grip, hip flexor, 5/5 within range of ankle dorsiflexor, knee extensor is 4 - but mainly limited by hamstring tone. Tone Left upper extremity MAS 1 at the elbow flexors MAS 1 at the finger flexors and wrist flexor MAS 3 at the knee flexor Lumbar spine no pain to palpation in the lumbar paraspinal area Lumbar range of motion limited to approximately 50% forward flexion and 25% extension      Assessment & Plan:  1.  History of right CVA causing left spastic hemiplegia he is getting Botox injections of the left upper and left lower limb at this point he looks like he needs the left hamstrings injected in fact we may need to increase his dose to 250 units we will see him back next month schedule for total of 300 units, may need to inject 50 units into the left upper extremity. 2.  Lumbar spinal stenosis continue tramadol 50 mg twice daily along with Ultram ER 200 mg/day Discussed with patient agrees with plan.

## 2021-11-03 ENCOUNTER — Emergency Department (HOSPITAL_COMMUNITY): Payer: Medicare Other

## 2021-11-03 ENCOUNTER — Emergency Department (HOSPITAL_COMMUNITY)
Admission: EM | Admit: 2021-11-03 | Discharge: 2021-11-03 | Disposition: A | Discharge status: Home / Self Care | Payer: Medicare Other | Attending: Emergency Medicine | Admitting: Emergency Medicine

## 2021-11-03 ENCOUNTER — Other Ambulatory Visit: Payer: Self-pay

## 2021-11-03 ENCOUNTER — Encounter (HOSPITAL_COMMUNITY): Payer: Self-pay | Admitting: Emergency Medicine

## 2021-11-03 DIAGNOSIS — I110 Hypertensive heart disease with heart failure: Secondary | ICD-10-CM | POA: Diagnosis not present

## 2021-11-03 DIAGNOSIS — R1084 Generalized abdominal pain: Secondary | ICD-10-CM | POA: Diagnosis not present

## 2021-11-03 DIAGNOSIS — I2510 Atherosclerotic heart disease of native coronary artery without angina pectoris: Secondary | ICD-10-CM | POA: Insufficient documentation

## 2021-11-03 DIAGNOSIS — R197 Diarrhea, unspecified: Secondary | ICD-10-CM | POA: Insufficient documentation

## 2021-11-03 DIAGNOSIS — Z7901 Long term (current) use of anticoagulants: Secondary | ICD-10-CM | POA: Diagnosis not present

## 2021-11-03 DIAGNOSIS — R1031 Right lower quadrant pain: Secondary | ICD-10-CM | POA: Insufficient documentation

## 2021-11-03 DIAGNOSIS — R7401 Elevation of levels of liver transaminase levels: Secondary | ICD-10-CM | POA: Insufficient documentation

## 2021-11-03 DIAGNOSIS — R1030 Lower abdominal pain, unspecified: Secondary | ICD-10-CM

## 2021-11-03 DIAGNOSIS — I5032 Chronic diastolic (congestive) heart failure: Secondary | ICD-10-CM | POA: Diagnosis not present

## 2021-11-03 DIAGNOSIS — K6389 Other specified diseases of intestine: Secondary | ICD-10-CM | POA: Diagnosis not present

## 2021-11-03 DIAGNOSIS — R1032 Left lower quadrant pain: Secondary | ICD-10-CM | POA: Insufficient documentation

## 2021-11-03 DIAGNOSIS — Z79899 Other long term (current) drug therapy: Secondary | ICD-10-CM | POA: Diagnosis not present

## 2021-11-03 DIAGNOSIS — R110 Nausea: Secondary | ICD-10-CM | POA: Diagnosis not present

## 2021-11-03 DIAGNOSIS — E119 Type 2 diabetes mellitus without complications: Secondary | ICD-10-CM | POA: Insufficient documentation

## 2021-11-03 LAB — COMPREHENSIVE METABOLIC PANEL
ALT: 54 U/L — ABNORMAL HIGH (ref 0–44)
AST: 54 U/L — ABNORMAL HIGH (ref 15–41)
Albumin: 4.2 g/dL (ref 3.5–5.0)
Alkaline Phosphatase: 61 U/L (ref 38–126)
Anion gap: 9 (ref 5–15)
BUN: 19 mg/dL (ref 8–23)
CO2: 24 mmol/L (ref 22–32)
Calcium: 9.8 mg/dL (ref 8.9–10.3)
Chloride: 106 mmol/L (ref 98–111)
Creatinine, Ser: 1.11 mg/dL (ref 0.61–1.24)
GFR, Estimated: >60 mL/min (ref >60)
Glucose, Bld: 142 mg/dL — ABNORMAL HIGH (ref 70–99)
Potassium: 4.1 mmol/L (ref 3.5–5.1)
Sodium: 139 mmol/L (ref 135–145)
Total Bilirubin: 0.6 mg/dL (ref 0.3–1.2)
Total Protein: 7.5 g/dL (ref 6.5–8.1)

## 2021-11-03 LAB — URINALYSIS, ROUTINE W REFLEX MICROSCOPIC
Bilirubin Urine: NEGATIVE
Glucose, UA: NEGATIVE mg/dL
Hgb urine dipstick: NEGATIVE
Ketones, ur: NEGATIVE mg/dL
Leukocytes,Ua: NEGATIVE
Nitrite: NEGATIVE
Protein, ur: NEGATIVE mg/dL
Specific Gravity, Urine: 1.025 (ref 1.005–1.030)
pH: 5.5 (ref 5.0–8.0)

## 2021-11-03 LAB — CBC
HCT: 48.4 % (ref 39.0–52.0)
Hemoglobin: 15.7 g/dL (ref 13.0–17.0)
MCH: 30.8 pg (ref 26.0–34.0)
MCHC: 32.4 g/dL (ref 30.0–36.0)
MCV: 94.9 fL (ref 80.0–100.0)
Platelets: 183 10*3/uL (ref 150–400)
RBC: 5.1 MIL/uL (ref 4.22–5.81)
RDW: 12.9 % (ref 11.5–15.5)
WBC: 9 10*3/uL (ref 4.0–10.5)
nRBC: 0 % (ref 0.0–0.2)

## 2021-11-03 LAB — LIPASE, BLOOD: Lipase: 35 U/L (ref 11–51)

## 2021-11-03 MED ORDER — ONDANSETRON HCL 4 MG/2ML IJ SOLN
4.0000 mg | Freq: Once | INTRAMUSCULAR | Status: AC
Start: 2021-11-03 — End: 2021-11-03
  Administered 2021-11-03: 4 mg via INTRAVENOUS
  Filled 2021-11-03: qty 2

## 2021-11-03 MED ORDER — IOHEXOL 300 MG/ML  SOLN
100.0000 mL | Freq: Once | INTRAMUSCULAR | Status: AC | PRN
  Administered 2021-11-03: 100 mL via INTRAVENOUS

## 2021-11-03 MED ORDER — DICYCLOMINE HCL 10 MG PO CAPS
10.0000 mg | ORAL_CAPSULE | Freq: Once | ORAL | Status: AC
Start: 2021-11-03 — End: 2021-11-03
  Administered 2021-11-03: 10 mg via ORAL
  Filled 2021-11-03: qty 1

## 2021-11-03 MED ORDER — LOPERAMIDE HCL 2 MG PO CAPS
2.0000 mg | ORAL_CAPSULE | Freq: Once | ORAL | Status: AC
Start: 2021-11-03 — End: 2021-11-03
  Administered 2021-11-03: 2 mg via ORAL
  Filled 2021-11-03: qty 1

## 2021-11-03 NOTE — ED Notes (Signed)
This NT completed an in and out on this pt, results sent to lab.

## 2021-11-03 NOTE — Discharge Instructions (Signed)
You came to the emergency department today to be evaluated for your abdominal pain and diarrhea.  Your lab results were reassuring.  The CT scan of your abdomen pelvis did not show any acute abnormalities.  Please follow the guidelines on diet modification to help relieve your diarrhea.  Additionally may take loperamide (Imodium) to help with your diarrhea.  If your diarrhea does not improve please follow-up with your primary care doctor for repeat evaluation.  Get help right away if: You have chest pain. You feel extremely weak or you faint. You have bloody or black stools or stools that look like tar. You have severe pain, cramping, or bloating in your abdomen. You have trouble breathing or you are breathing very quickly. Your heart is beating very quickly. Your skin feels cold and clammy. You feel confused. You have signs of dehydration, such as: Dark urine, very little urine, or no urine. Cracked lips. Dry mouth. Sunken eyes. Sleepiness. Weakness.

## 2021-11-03 NOTE — ED Triage Notes (Signed)
Patient reports mid abdominal pain and multiple diarrhea onset last night after eating supper (meatballs) , no emesis or fever .

## 2021-11-03 NOTE — ED Provider Notes (Signed)
[facility] EMERGENCY DEPARTMENT Provider Note   CSN: [id number] Arrival date & time: 11/03/21  0600     History  Chief Complaint  Patient presents with   Abdominal Pain    Diarrhea    [name] is a 76 y.o. 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  Patient last had colonoscopy performed by Dr. Bryan Lemma with [facility] gastroenterology 02/2020.  Presents to the emergency department complaint of abdominal cramping and diarrhea.  Patient reports that patient's been started yesterday evening around 830 to 9 PM.  Patient states that he had dinner at approximately 5:45 PM.  States that he ate egg noodles with tomato sauce and meatballs.  Patient states that approximately 1 hour prior he had a bowel movement and his pain started thereafter.  Patient reports that he has been having constant diarrhea since yesterday evening.  Patient the stool is "leaking out of me."  Patient endorses generalized abdominal cramping intermittently.  Patient also endorses nausea however states it is "just a little."  Patient denies any recent antibiotic use, international travel, camping, history of abdominal surgery.  Patient denies any fever, chills, vomiting, blood in stool, melena, dysuria, hematuria, urinary urgency, urinary frequency, swelling or tenderness genitals, genital sores or lesions.   Abdominal Pain Associated symptoms: diarrhea and nausea   Associated symptoms: no chest pain, no chills, no constipation, no dysuria, no fever, no hematuria, no shortness of breath and no vomiting        Home Medications Prior to Admission medications   Medication Sig Start Date End Date Taking? Authorizing Provider  albuterol (VENTOLIN HFA) 108  (90 Base) MCG/ACT inhaler Inhale 1-2 puffs into the lungs every 6 (six) hours as needed for wheezing or shortness of breath.    [provider]  allopurinol (ZYLOPRIM) 100 MG tablet TAKE 2 TABLETS BY MOUTH EVERY DAY 10/09/21   Debbrah Alar, NP  atorvastatin (LIPITOR) 40 MG tablet TAKE 1 TABLET BY MOUTH EVERY DAY 07/18/21   Debbrah Alar, NP  benazepril (LOTENSIN) 10 MG tablet TAKE 1 TABLET (10 MG TOTAL) BY MOUTH DAILY. TAKE WITH ADDITIONAL 5MG TABLET OF BENAZEPRIL DAILY 06/17/21   Debbrah Alar, NP  benazepril (LOTENSIN) 5 MG tablet TAKE 1 TABLET BY MOUTH DAILY WITH 10MG OF BENAZEPRIL TO EQUAL 15MG DAILY 09/09/21   Debbrah Alar, NP  blood glucose meter kit and supplies KIT Dispense based on patient and insurance preference. Use up to four times daily as directed. (FOR ICD-9 250.00, 250.01). 01/26/19   Isaiah Serge, NP  calcium carbonate (TUMS - DOSED IN MG ELEMENTAL CALCIUM) 500 MG chewable tablet Chew 1 tablet by mouth daily as needed for indigestion or heartburn.    [provider]  Continuous Blood Gluc Receiver (FREESTYLE LIBRE 2 READER) DEVI Use as directed 12/11/20   Debbrah Alar, NP  Continuous Blood Gluc Sensor (FREESTYLE LIBRE 14 DAY SENSOR) MISC USE AS DIRECTED 12/21/20   Debbrah Alar, NP  cyanocobalamin 1000 MCG tablet Take 2,000 mcg by mouth daily.    [provider]  diltiazem (CARDIZEM CD) 180 MG 24 hr capsule Take 1 capsule (180 mg total) by mouth daily. 02/22/21   Shirley Friar, PA-C  dofetilide (TIKOSYN) 500 MCG capsule TAKE 1 CAPSULE BY MOUTH 2 TIMES DAILY. 05/07/21   Shirley Friar, PA-C  ELIQUIS 5 MG TABS tablet TAKE 1 TABLET BY MOUTH TWICE A DAY 10/09/21   Jerline Pain, MD  ferrous sulfate 325 (65 FE) MG tablet Take 1 tablet (325 mg total) by mouth 2 (two) times daily with a meal. 05/25/19   Debbrah Alar, NP  fluticasone (FLONASE) 50 MCG/ACT nasal spray Place 1 spray into both nostrils  daily. Patient taking differently: Place 1 spray into both nostrils daily as needed for allergies. 12/24/20   LampteyMyrene Galas, MD  folic acid (FOLVITE) 1 MG tablet Take 1 tablet (1 mg total) by mouth daily. 03/04/21   Debbrah Alar, NP  furosemide (LASIX) 40 MG tablet TAKE 1 TABLET BY MOUTH EVERY DAY AS NEEDED 04/29/21   Debbrah Alar, NP  gabapentin (NEURONTIN) 100 MG capsule TAKE 1 CAPSULE BY MOUTH AT BEDTIME. 07/18/21   Debbrah Alar, NP  glucose blood (ACCU-CHEK GUIDE) test strip USE AS DIRECTED UP TO 4 TIMES DAILY 03/21/19   Debbrah Alar, NP  levothyroxine (SYNTHROID) 112 MCG tablet TAKE 1 TABLET BY MOUTH EVERY DAY BEFORE BREAKFAST 10/14/21   Debbrah Alar, NP  methocarbamol (ROBAXIN) 500 MG tablet Take 500 mg by mouth every 8 (eight) hours as needed. 09/10/21   [provider]  metoprolol succinate (TOPROL-XL) 25 MG 24 hr tablet Take 1 tablet (25 mg total) by mouth daily. 02/25/21   Deboraha Sprang, MD  Multiple Vitamins-Minerals (MULTIVITAMIN GUMMIES MENS PO) Take 2 tablets by mouth daily with breakfast.     [provider]  nitroGLYCERIN (NITROSTAT) 0.4 MG SL tablet Place 1 tablet (0.4 mg total) under the tongue every 5 (five) minutes as needed for chest pain. Up to 3 doses 03/05/15   Debbrah Alar, NP  OXYGEN Inhale 2 L into the lungs at bedtime.    [provider]  pantoprazole (PROTONIX) 40 MG tablet TAKE 1 TABLET BY MOUTH EVERY DAY BEFORE BREAKFAST 10/01/21   Debbrah Alar, NP  sennosides-docusate sodium (SENOKOT-S) 8.6-50 MG tablet Take 1 tablet by mouth as needed for constipation.    [provider]  traMADol (ULTRAM) 50 MG tablet TAKE 1 TABLET BY MOUTH TWICE A DAY 09/16/21   Kirsteins, Luanna Salk, MD  traMADol (ULTRAM-ER) 200 MG 24 hr tablet TAKE 1 TABLET BY MOUTH EVERY DAY 09/16/21   Kirsteins, Luanna Salk, MD      Allergies    Patient has no known allergies.    Review of Systems   Review of Systems   Constitutional:  Negative for chills and fever.  Eyes:  Negative for visual disturbance.  Respiratory:  Negative for shortness of breath.   Cardiovascular:  Negative for chest pain.  Gastrointestinal:  Positive for abdominal pain, diarrhea and nausea. Negative for abdominal distention, anal bleeding, blood in stool, constipation, rectal pain and vomiting.  Genitourinary:  Negative for difficulty urinating, dysuria, flank pain, genital sores, hematuria, penile discharge, penile pain, penile swelling, scrotal swelling, testicular pain and urgency.  Musculoskeletal:  Negative for back pain and neck pain.  Skin:  Negative for color change and rash.  Neurological:  Negative for dizziness, syncope, light-headedness and headaches.  Psychiatric/Behavioral:  Negative for confusion.     Physical Exam Updated Vital Signs BP 135/85 (BP Location: Right Arm)   Pulse 79   Temp 98.7 F (37.1 C) (Oral)   Resp 18   SpO2 96%  Physical Exam Vitals and nursing note reviewed.  Constitutional:      General: He is not in acute distress.    Appearance: He is  not ill-appearing, toxic-appearing or diaphoretic.  HENT:     Head: Normocephalic.  Eyes:     General: No scleral icterus.       Right eye: No discharge.        Left eye: No discharge.  Cardiovascular:     Rate and Rhythm: Normal rate.  Pulmonary:     Effort: Pulmonary effort is normal.  Abdominal:     General: Abdomen is flat. There is no distension. There are no signs of injury.     Palpations: Abdomen is soft. There is no mass.     Tenderness: There is abdominal tenderness in the right lower quadrant and left lower quadrant. There is no guarding or rebound.     Hernia: There is no hernia in the umbilical area or ventral area.     Comments: Minimal tenderness to lower right and left quadrants  Skin:    General: Skin is warm and dry.  Neurological:     General: No focal deficit present.     Mental Status: He is alert.  Psychiatric:         Behavior: Behavior is cooperative.     ED Results / Procedures / Treatments   Labs (all labs ordered are listed, but only abnormal results are displayed) Labs Reviewed  COMPREHENSIVE METABOLIC PANEL - Abnormal; Notable for the following components:      Result Value   Glucose, Bld 142 (*)    AST 54 (*)    ALT 54 (*)    All other components within normal limits  LIPASE, BLOOD  CBC  URINALYSIS, ROUTINE W REFLEX MICROSCOPIC    EKG None  Radiology CT ABDOMEN PELVIS W CONTRAST  Result Date: 11/03/2021 CLINICAL DATA:  Mid abdominal pain.  Diarrhea. EXAM: CT ABDOMEN AND PELVIS WITH CONTRAST TECHNIQUE: Multidetector CT imaging of the abdomen and pelvis was performed using the standard protocol following bolus administration of intravenous contrast. RADIATION DOSE REDUCTION: This exam was performed according to the departmental dose-optimization program which includes automated exposure control, adjustment of the mA and/or kV according to patient size and/or use of iterative reconstruction technique. CONTRAST:  139mL OMNIPAQUE IOHEXOL 300 MG/ML  SOLN COMPARISON:  10/29/2005. FINDINGS: Lower chest: No acute abnormality. Hepatobiliary: Liver dome excluded from the field of view. Visualized liver normal in size. No mass. Normal gallbladder. No bile duct dilation. Pancreas: Unremarkable. No pancreatic ductal dilatation or surrounding inflammatory changes. Spleen: Normal in size without focal abnormality. Adrenals/Urinary Tract: Normal adrenal glands. Kidneys normal in size, orientation and position. Symmetric renal enhancement and excretion. No renal mass, stone or hydronephrosis. Normal ureters. Normal bladder. Stomach/Bowel: Stomach decompressed, otherwise unremarkable. Small bowel and colon are normal in caliber. No wall thickening. No inflammation. Colon demonstrates air-fluid levels. Rectum is fluid-filled. Vascular/Lymphatic: Aortic atherosclerosis. No aneurysm. No enlarged lymph nodes.  Reproductive: Unremarkable. Other: No abdominal wall hernia or abnormality. No abdominopelvic ascites. Musculoskeletal: No fracture or acute finding.  No bone lesion. IMPRESSION: 1. Colon demonstrates air-fluid levels, with the rectum also fluid-filled, consistent with the history of diarrhea. No bowel inflammation. No bowel obstruction. 2. No other acute abnormality within the abdomen or pelvis. 3. Aortic atherosclerosis. Electronically Signed   By: Lajean Manes M.D.   On: 11/03/2021 12:04    Procedures Procedures    Medications Ordered in ED Medications  loperamide (IMODIUM) capsule 2 mg (has no administration in time range)  dicyclomine (BENTYL) capsule 10 mg (10 mg Oral Given 11/03/21 0957)  ondansetron (ZOFRAN) injection 4 mg (4 mg  Intravenous Given 11/03/21 1130)  iohexol (OMNIPAQUE) 300 MG/ML solution 100 mL (100 mLs Intravenous Contrast Given 11/03/21 1146)    ED Course/ Medical Decision Making/ A&P                           Medical Decision Making Amount and/or Complexity of Data Reviewed Labs: ordered. Radiology: ordered.  Risk Prescription drug management.   Alert 76 year old male in no acute distress, nontoxic-appearing.  Presents emergency department with chief complaint of diarrhea and abdominal cramping.  Information was obtained from patient.  I reviewed patient's past medical records including previous prior notes, labs, and imaging.  Patient has medical history as outlined in HPI which complicates his care.  Physical exam patient has tenderness to bilateral lower abdomen concern for possible diverticulitis.  Will obtain noncontrast CT imaging for further evaluation.  Patient given Bentyl for abdominal cramping at this time. I personally viewed interpret patient's lab results.  Pertinent findings include: -No leukocytosis or anemia -Lipase within normal limits -Slight transaminitis with AST and ALT at 54  Patient care discussed with attending physician Dr.Dixon  I  personally viewed interpret patient CT imaging.  Agree with radiology interpretation of air-fluid level within colon and rectum consistent with history of diarrhea.  No bowel inflammation, bowel obstruction, or other acute abnormality within the abdomen or pelvis.  Patient has improvement in abdominal cramping after receiving Bentyl.  Patient has resolution of nausea after receiving Zofran.  Low suspicion for C. difficile as patient has not had any recent antibiotics and no leukocytosis noted.  Do not suspect that patient's diarrhea is bacterial in nature.  We will give patient dose of loperamide for his diarrhea.  Patient hemodynamically stable.  Abdomen soft, nondistended, normal tenderness to bilateral lower abdomen on serial reexamination.  Discussed symptomatic treatment with diet modification and loperamide as needed.  Patient follow-up with PCP if his diarrhea does not improve.  Based on patient's chief complaint, I considered admission might be necessary, however after reassuring ED workup feel patient is reasonable for discharge.  Discussed results, findings, treatment and follow up. Patient advised of return precautions. Patient verbalized understanding and agreed with plan.  Portions of this note were generated with [employer]. Dictation errors may occur despite best attempts at proofreading.         Final Clinical Impression(s) / ED Diagnoses Final diagnoses:  Diarrhea, unspecified type  Lower abdominal pain    Rx / DC Orders ED Discharge Orders     None         Loni Beckwith, PA-C 11/03/21 1452    Godfrey Pick, MD 11/05/21 [phone]

## 2021-11-09 ENCOUNTER — Other Ambulatory Visit: Payer: Self-pay | Admitting: Family

## 2021-11-11 ENCOUNTER — Encounter (INDEPENDENT_AMBULATORY_CARE_PROVIDER_SITE_OTHER): Payer: Self-pay | Admitting: Ophthalmology

## 2021-11-11 ENCOUNTER — Ambulatory Visit (INDEPENDENT_AMBULATORY_CARE_PROVIDER_SITE_OTHER): Payer: Medicare Other | Admitting: Ophthalmology

## 2021-11-11 ENCOUNTER — Encounter (INDEPENDENT_AMBULATORY_CARE_PROVIDER_SITE_OTHER): Payer: Medicare Other | Admitting: Ophthalmology

## 2021-11-11 DIAGNOSIS — Z9861 Coronary angioplasty status: Secondary | ICD-10-CM | POA: Diagnosis not present

## 2021-11-11 DIAGNOSIS — H33101 Unspecified retinoschisis, right eye: Secondary | ICD-10-CM

## 2021-11-11 DIAGNOSIS — H43813 Vitreous degeneration, bilateral: Secondary | ICD-10-CM | POA: Insufficient documentation

## 2021-11-11 DIAGNOSIS — I2510 Atherosclerotic heart disease of native coronary artery without angina pectoris: Secondary | ICD-10-CM | POA: Diagnosis not present

## 2021-11-11 DIAGNOSIS — H35371 Puckering of macula, right eye: Secondary | ICD-10-CM

## 2021-11-11 DIAGNOSIS — H35372 Puckering of macula, left eye: Secondary | ICD-10-CM

## 2021-11-11 DIAGNOSIS — E119 Type 2 diabetes mellitus without complications: Secondary | ICD-10-CM

## 2021-11-11 NOTE — Assessment & Plan Note (Addendum)
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  Patient currently not symptomatic likely because he has not been checking his acuity in the monocular fashion as described in the past.  Extensive discussion checking his monocular reading ability glasses on and comparing the right eye to the left eye to determine if there is any symptomatic or noticeable visual acuity differences at home  If the difference does develop in the right eye discussion about the repair of the right eye could be undertaken   The nature of macular pucker (epiretinal membrane ERM) was discussed with the patient as well as threshold criteria for vitrectomy surgery. I explained that in rare cases another surgery is needed to actually remove a second wrinkle should it regrow.  Most often, the epiretinal membrane and underlying wrinkled internal limiting membrane are removed with the first surgery, to accomplish the goals.   If the operative eye is Phak[place] (natural lens still present), cataract surgery is often recommended prior to Vitrectomy. This will enable the retina surgeon to have the best view during surgery and the patient to obtain optimal results in the future. Treatment options were discussed.  I have recommended at home monitoring the near vision task in a monocular (1 eye at a time), with or without near vision glasses, to look for changes or declines in reading.

## 2021-11-11 NOTE — Assessment & Plan Note (Signed)
Physiologic OU 

## 2021-11-11 NOTE — Progress Notes (Signed)
11/11/2021     CHIEF COMPLAINT Patient presents for  Chief Complaint  Patient presents with   Retina Follow Up      HISTORY OF PRESENT ILLNESS: [name] is a 76 y.o. male who presents to the clinic today for:   HPI     Retina Follow Up           Diagnosis: Other   Laterality: right eye   Severity: moderate   Course: stable         Comments   7 MOS for DILATE OU, OCT. Pt stated vision has remained stable since last visit. Pt had an hemorrhoid operation on June 22nd.       Last edited by Silvestre Moment on 11/11/2021  9:28 AM.      Referring physician: Debbrah Alar, NP [address] [place],  [place] [address]  HISTORICAL INFORMATION:   Selected notes from the MEDICAL RECORD NUMBER    Lab Results  Component Value Date   HGBA1C 5.9 (H) 08/28/2021     CURRENT MEDICATIONS: No current outpatient medications on file. (Ophthalmic Drugs)   No current facility-administered medications for this visit. (Ophthalmic Drugs)   Current Outpatient Medications (Other)  Medication Sig   albuterol (VENTOLIN HFA) 108 (90 Base) MCG/ACT inhaler Inhale 1-2 puffs into the lungs every 6 (six) hours as needed for wheezing or shortness of breath.   allopurinol (ZYLOPRIM) 100 MG tablet TAKE 2 TABLETS BY MOUTH EVERY DAY   atorvastatin (LIPITOR) 40 MG tablet TAKE 1 TABLET BY MOUTH EVERY DAY   benazepril (LOTENSIN) 10 MG tablet TAKE 1 TABLET (10 MG TOTAL) BY MOUTH DAILY. TAKE WITH ADDITIONAL 5MG TABLET OF BENAZEPRIL DAILY   benazepril (LOTENSIN) 5 MG tablet TAKE 1 TABLET BY MOUTH DAILY WITH 10MG OF BENAZEPRIL TO EQUAL 15MG DAILY   blood glucose meter kit and supplies KIT Dispense based on patient and insurance preference. Use up to four times daily as directed. (FOR ICD-9 250.00, 250.01).   calcium carbonate (TUMS - DOSED IN MG ELEMENTAL CALCIUM) 500 MG chewable tablet Chew 1 tablet by mouth daily as needed for indigestion or heartburn.   Continuous Blood Gluc  Receiver (FREESTYLE LIBRE 2 READER) DEVI Use as directed   Continuous Blood Gluc Sensor (FREESTYLE LIBRE 14 DAY SENSOR) MISC USE AS DIRECTED   cyanocobalamin 1000 MCG tablet Take 2,000 mcg by mouth daily.   diltiazem (CARDIZEM CD) 180 MG 24 hr capsule Take 1 capsule (180 mg total) by mouth daily.   dofetilide (TIKOSYN) 500 MCG capsule TAKE 1 CAPSULE BY MOUTH 2 TIMES DAILY.   ELIQUIS 5 MG TABS tablet TAKE 1 TABLET BY MOUTH TWICE A DAY   ferrous sulfate 325 (65 FE) MG tablet Take 1 tablet (325 mg total) by mouth 2 (two) times daily with a meal.   fluticasone (FLONASE) 50 MCG/ACT nasal spray Place 1 spray into both nostrils daily. (Patient taking differently: Place 1 spray into both nostrils daily as needed for allergies.)   folic acid (FOLVITE) 1 MG tablet Take 1 tablet (1 mg total) by mouth daily.   furosemide (LASIX) 40 MG tablet TAKE 1 TABLET BY MOUTH EVERY DAY AS NEEDED   gabapentin (NEURONTIN) 100 MG capsule TAKE 1 CAPSULE BY MOUTH AT BEDTIME.   glucose blood (ACCU-CHEK GUIDE) test strip USE AS DIRECTED UP TO 4 TIMES DAILY   levothyroxine (SYNTHROID) 112 MCG tablet TAKE 1 TABLET BY MOUTH EVERY DAY BEFORE BREAKFAST   methocarbamol (ROBAXIN) 500 MG  tablet Take 500 mg by mouth every 8 (eight) hours as needed.   metoprolol succinate (TOPROL-XL) 25 MG 24 hr tablet Take 1 tablet (25 mg total) by mouth daily.   Multiple Vitamins-Minerals (MULTIVITAMIN GUMMIES MENS PO) Take 2 tablets by mouth daily with breakfast.    nitroGLYCERIN (NITROSTAT) 0.4 MG SL tablet Place 1 tablet (0.4 mg total) under the tongue every 5 (five) minutes as needed for chest pain. Up to 3 doses   OXYGEN Inhale 2 L into the lungs at bedtime.   pantoprazole (PROTONIX) 40 MG tablet TAKE 1 TABLET BY MOUTH EVERY DAY BEFORE BREAKFAST   sennosides-docusate sodium (SENOKOT-S) 8.6-50 MG tablet Take 1 tablet by mouth as needed for constipation.   traMADol (ULTRAM) 50 MG tablet TAKE 1 TABLET BY MOUTH TWICE A DAY   traMADol (ULTRAM-ER) 200  MG 24 hr tablet TAKE 1 TABLET BY MOUTH EVERY DAY   No current facility-administered medications for this visit. (Other)      REVIEW OF SYSTEMS: ROS   Negative for: Constitutional, Gastrointestinal, Neurological, Skin, Genitourinary, Musculoskeletal, HENT, Endocrine, Cardiovascular, Eyes, Respiratory, Psychiatric, Allergic/Imm, Heme/Lymph Last edited by Silvestre Moment on 11/11/2021  9:28 AM.       ALLERGIES No Known Allergies  PAST MEDICAL HISTORY Past Medical History:  Diagnosis Date   Acute encephalopathy 12/12/2015   Acute respiratory failure with hypoxia and hypercapnia (HCC) 12/12/2015   Allergic rhinitis 02/13/2010   Qualifier: Diagnosis of  By: Wynona Luna    Angina at rest [facility]) 10/22/2015   Arthritis    Ataxia 08/10/2013   Atrial fibrillation ([place]) 11/2018   B12 deficiency 08/11/2013   BACK PAIN, LUMBAR 01/29/2009   Qualifier: Diagnosis of  By: Yoo DO, [place] POSITIONAL VERTIGO 11/23/2009   Qualifier: Diagnosis of  By: Wynona Luna    Chronic diastolic heart failure ([place]) 04/24/2015   Chronic pain    left sided-[place]   Coronary 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 ([place]) 11/04/2015   s/p CABG 2004; CFX DES 2011   Essential hypertension 12/12/2006   Qualifier: Diagnosis of  By: [name] RMA, [place]     GERD 12/12/2006   Qualifier: Diagnosis of  By: [name] RMA, [place]     GOUT 12/12/2006   Qualifier: Diagnosis of  By: [name] RMA, Lucy     Hemorrhoids    Hyperlipidemia LDL goal <70 12/12/2006   Qualifier: Diagnosis of  By: [name] RMA, [place]      Hypogonadism male 04/10/2011   Hypothyroidism, acquired 02/15/2013   Insomnia 05/21/2013   Long term current use of anticoagulant therapy 06/24/2011   Myocardial infarction ([place])    Obstructive sleep apnea-Failed CPAP 06/17/2007   Failed CPAP Using O 2 for sleep    Paroxysmal atrial fibrillation ([place]) 12/06/2016   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   also 1996; resultant hemiplegia of  dominant side   Thoracic aorta atherosclerosis ([place]) 04/27/2017   Tubular adenoma of colon 05/2010   Type 2 diabetes mellitus without complication, without long-term current use of insulin ([place]) 10/05/2007   Qualifier: Diagnosis of  By: Wynona Luna    Vasovagal syncope    Vasovagal syncope    Wears glasses    Past Surgical History:  Procedure Laterality Date   CARDIAC CATHETERIZATION N/A 10/25/2015   Procedure: Left Heart Cath and Cors/Grafts Angiography;  Surgeon: Troy Sine, MD;  Location: [facility];  Service: Cardiovascular;  Laterality: N/A;   CARDIOVERSION  06/20/2011   Procedure: CARDIOVERSION;  Surgeon: Larey Dresser, MD;  Location: [facility];  Service: Cardiovascular;  Laterality: N/A;   CARDIOVERSION N/A 04/26/2015   Procedure: CARDIOVERSION;  Surgeon: Dorothy Spark, MD;  Location: [facility];  Service: Cardiovascular;  Laterality: N/A;   CARDIOVERSION N/A 05/10/2015   Procedure: CARDIOVERSION;  Surgeon: Larey Dresser, MD;  Location: [facility];  Service: Cardiovascular;  Laterality: N/A;   CARDIOVERSION N/A 12/10/2018   Procedure: CARDIOVERSION;  Surgeon: Sanda Klein, MD;  Location: [facility];  Service: Cardiovascular;  Laterality: N/A;   CATARACT EXTRACTION  05/2010   left eye   CATARACT EXTRACTION  04/2010   right eye   COLONOSCOPY WITH PROPOFOL N/A 02/22/2020   Procedure: COLONOSCOPY WITH PROPOFOL;  Surgeon: Lavena Bullion, DO;  Location: [facility];  Service: Gastroenterology;  Laterality: N/A;   CORONARY ARTERY BYPASS GRAFT     stent   ESOPHAGOGASTRODUODENOSCOPY  03-25-2005   HEMORRHOID SURGERY N/A 09/05/2021   Procedure: HEMORRHOIDECTOMY WITH LIGATION AND HEMORRHOIDOPEXY;  Surgeon: Michael Boston, MD;  Location: [facility];  Service: General;  Laterality: N/A;   LEFT HEART CATH AND CORS/GRAFTS ANGIOGRAPHY N/A 02/19/2018   Procedure: LEFT HEART CATH AND CORS/GRAFTS ANGIOGRAPHY;  Surgeon: Martinique, Peter M, MD;  Location: [facility];   Service: Cardiovascular;  Laterality: N/A;   NASAL SEPTUM SURGERY     PERCUTANEOUS PLACEMENT INTRAVASCULAR STENT CERVICAL CAROTID ARTERY     03-2009; using a drug-eluting platform of the circumflex cornoray artery with a 3.0 x 18 [employer] Promus drug-eluting platform post dilated to 3.75 with a noncompliant balloon.   POLYPECTOMY  02/22/2020   Procedure: POLYPECTOMY;  Surgeon: Lavena Bullion, DO;  Location: [facility];  Service: Gastroenterology;;   RECTAL EXAM UNDER ANESTHESIA N/A 09/05/2021   Procedure: ANORECTAL EXAM UNDER ANESTHESIA;  Surgeon: Michael Boston, MD;  Location: [facility];  Service: General;  Laterality: N/A;   TEE WITHOUT CARDIOVERSION  06/20/2011   Procedure: TRANSESOPHAGEAL ECHOCARDIOGRAM (TEE);  Surgeon: Larey Dresser, MD;  [facility];  Service: Cardiovascular;  Laterality: N/A;    FAMILY HISTORY Family History  Problem Relation Age of Onset   Lung cancer Father        deceased   Stroke Mother        deceased-MINISTROKES   Colon cancer Neg Hx    Esophageal cancer Neg Hx    Stomach cancer Neg Hx    Pancreatic cancer Neg Hx    Liver disease Neg Hx     SOCIAL HISTORY Social History   Tobacco Use   Smoking status: Never   Smokeless tobacco: Never  Vaping Use   Vaping Use: Never used  Substance Use Topics   Alcohol use: Not Currently   Drug use: No         OPHTHALMIC EXAM:  Base Eye Exam     Visual Acuity (ETDRS)       Right Left   Dist cc 20/30 -1 20/20 -2   Dist ph cc 20/25 -2     Correction: Glasses         Tonometry (Tonopen, 9:33 AM)       Right Left   Pressure 11 13         Pupils       Pupils Dark Light Shape React APD   Right PERRL 3 2 Round Brisk None   Left PERRL 3 2 Round Brisk None         Visual Fields  Left Right    Full Full         Extraocular Movement       Right Left    Full Full         Neuro/Psych     Oriented x3: Yes         Dilation     Both eyes: 1.0%  Mydriacyl, 2.5% Phenylephrine @ 9:33 AM           Slit Lamp and Fundus Exam     External Exam       Right Left   External Normal Normal         Slit Lamp Exam       Right Left   Lids/Lashes Normal Normal   Conjunctiva/Sclera White and quiet White and quiet   Cornea Clear Clear   Anterior Chamber Deep and quiet Deep and quiet   Iris Round and reactive Round and reactive   Lens Centered posterior chamber intraocular lens Centered posterior chamber intraocular lens   Anterior Vitreous Normal Normal         Fundus Exam       Right Left   Posterior Vitreous Posterior vitreous detachment Posterior vitreous detachment   Disc Normal Normal   C/D Ratio 0.55 0.5   Macula Epiretinal membrane, moderate to severe topographic distortion Normal   Vessels Normal Normal   Periphery Normal Normal            IMAGING AND PROCEDURES  Imaging and Procedures for 11/11/21  OCT, Retina - OU - Both Eyes       Right Eye Quality was good. Scan locations included subfoveal. Central Foveal Thickness: 400. Progression has worsened. Findings include abnormal foveal contour, epiretinal membrane.   Left Eye Quality was good. Central Foveal Thickness: 280. Progression has been stable. Findings include normal foveal contour, epiretinal membrane.   Notes Macular thickening right eye with epiretinal membrane giving a pseudocyst of the inner retina and some early inner foveal macular schisis temporally. No interval changes occurred in the right eye as compared to May 2022   OS with minor epiretinal membrane none foveal superonasal, no impact on macular anatomy                ASSESSMENT/PLAN:  Macular pucker, left eye Minor OS nasal to FAZ observe no impact on acuity  Macular pucker, right eye Center involved with foveal macular schisis probably accounts for the 20/30 vision in the right eye.  Patient currently not symptomatic likely because he has not been checking his  acuity in the monocular fashion as described in the past.  Extensive discussion checking his monocular reading ability glasses on and comparing the right eye to the left eye to determine if there is any symptomatic or noticeable visual acuity differences at home  If the difference does develop in the right eye discussion about the repair of the right eye could be undertaken   The nature of macular pucker (epiretinal membrane ERM) was discussed with the patient as well as threshold criteria for vitrectomy surgery. I explained that in rare cases another surgery is needed to actually remove a second wrinkle should it regrow.  Most often, the epiretinal membrane and underlying wrinkled internal limiting membrane are removed with the first surgery, to accomplish the goals.   If the operative eye is [place] (natural lens still present), cataract surgery is often recommended prior to Vitrectomy. This will enable the retina surgeon to have the best view during surgery and the  patient to obtain optimal results in the future. Treatment options were discussed.  I have recommended at home monitoring the near vision task in a monocular (1 eye at a time), with or without near vision glasses, to look for changes or declines in reading.  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 Macular pucker, right eye  H35.371 OCT, Retina - OU - Both Eyes    2. Macular pucker, left eye  H35.372     3. Type 2 diabetes mellitus without complication, without long-term current use of insulin (HCC)  E11.9     4. Posterior vitreous detachment, both eyes  H43.813     5. Macular retinoschisis of right eye  H33.101       1.  OD with significant foveal macular schisis  secondary to epiretinal membrane.  Nonetheless moderate visual acuity impairment currently not noticeable to the patient.  Monocular testing at home reviewed with the patient so he can determine when acuity particularly reading vision is affected  2.  OS minor ERM observe  3.  No detectable DR OU  Ophthalmic Meds Ordered this visit:  No orders of the defined types were placed in this encounter.      Return in about 6 months (around 05/14/2022) for dilate, OD, OCT.  There are no Patient Instructions on file for this visit.   Explained the diagnoses, plan, and follow up with the patient and they expressed understanding.  Patient expressed understanding of the importance of proper follow up care.   Clent Demark Waleed Dettman M.D. Diseases & Surgery of the Retina and Vitreous [facility] [place] 11/11/21     Abbreviations: M myopia (nearsighted); A astigmatism; H hyperopia (farsighted); P presbyopia; Mrx spectacle prescription;  CTL contact lenses; OD right eye; OS left eye; OU both eyes  XT exotropia; ET esotropia; PEK punctate epithelial keratitis; PEE punctate epithelial erosions; DES dry eye syndrome; MGD meibomian gland dysfunction; ATs artificial tears; PFAT's preservative free artificial tears; [place] nuclear sclerotic cataract; PSC posterior subcapsular cataract; ERM epi-retinal membrane; PVD posterior vitreous detachment; RD retinal detachment; DM diabetes mellitus; DR diabetic retinopathy; NPDR non-proliferative diabetic retinopathy; PDR proliferative diabetic retinopathy; CSME clinically significant macular edema; DME diabetic macular edema; dbh dot blot hemorrhages; CWS cotton wool spot; POAG primary open angle glaucoma; C/D cup-to-disc ratio; HVF humphrey visual field; GVF goldmann visual field; OCT optical coherence tomography; IOP intraocular pressure; BRVO Branch retinal vein occlusion; CRVO central retinal vein occlusion; CRAO central retinal artery occlusion; BRAO branch retinal  artery occlusion; RT retinal tear; SB scleral buckle; PPV pars plana vitrectomy; VH Vitreous hemorrhage; PRP panretinal laser photocoagulation; IVK intravitreal kenalog; VMT vitreomacular traction; MH Macular hole;  NVD neovascularization of the disc; NVE neovascularization elsewhere; AREDS age related eye disease study; ARMD age related macular degeneration; POAG primary open angle glaucoma; EBMD epithelial/anterior basement membrane dystrophy; ACIOL anterior chamber intraocular lens; IOL intraocular lens; PCIOL posterior chamber intraocular lens; Phaco/IOL phacoemulsification with intraocular lens placement; [place] photorefractive keratectomy; LASIK laser assisted in situ keratomileusis; HTN hypertension; DM diabetes mellitus; COPD chronic obstructive pulmonary disease

## 2021-11-11 NOTE — Assessment & Plan Note (Signed)
Minor OS nasal to FAZ observe no impact on acuity

## 2021-11-11 NOTE — Assessment & Plan Note (Signed)
No detectable diabetic retinopathy 

## 2021-11-11 NOTE — Assessment & Plan Note (Signed)
Secondary to ERM moderate impact on acuity of present

## 2021-11-25 ENCOUNTER — Ambulatory Visit: Payer: Medicare Other | Admitting: Family

## 2021-12-18 ENCOUNTER — Encounter (INDEPENDENT_AMBULATORY_CARE_PROVIDER_SITE_OTHER): Payer: Self-pay

## 2021-12-18 DIAGNOSIS — H524 Presbyopia: Secondary | ICD-10-CM | POA: Diagnosis not present

## 2021-12-18 DIAGNOSIS — H35371 Puckering of macula, right eye: Secondary | ICD-10-CM | POA: Diagnosis not present

## 2021-12-18 DIAGNOSIS — E119 Type 2 diabetes mellitus without complications: Secondary | ICD-10-CM | POA: Diagnosis not present

## 2021-12-18 DIAGNOSIS — H35341 Macular cyst, hole, or pseudohole, right eye: Secondary | ICD-10-CM | POA: Diagnosis not present

## 2021-12-26 ENCOUNTER — Encounter: Payer: Self-pay | Admitting: Physical Medicine & Rehabilitation

## 2021-12-26 ENCOUNTER — Encounter: Payer: Medicare Other | Attending: Physical Medicine & Rehabilitation | Admitting: Physical Medicine & Rehabilitation

## 2021-12-26 VITALS — BP 145/75 | HR 60 | Ht 75.0 in | Wt 237.0 lb

## 2021-12-26 DIAGNOSIS — G894 Chronic pain syndrome: Secondary | ICD-10-CM | POA: Diagnosis not present

## 2021-12-26 DIAGNOSIS — Z5181 Encounter for therapeutic drug level monitoring: Secondary | ICD-10-CM | POA: Diagnosis not present

## 2021-12-26 DIAGNOSIS — G8112 Spastic hemiplegia affecting left dominant side: Secondary | ICD-10-CM | POA: Diagnosis not present

## 2021-12-26 DIAGNOSIS — Z79891 Long term (current) use of opiate analgesic: Secondary | ICD-10-CM | POA: Insufficient documentation

## 2021-12-26 MED ORDER — ONABOTULINUMTOXINA 100 UNITS IJ SOLR
300.0000 [IU] | Freq: Once | INTRAMUSCULAR | Status: AC
  Administered 2021-12-26: 300 [IU] via INTRAMUSCULAR

## 2021-12-26 MED ORDER — SODIUM CHLORIDE (PF) 0.9 % IJ SOLN
6.0000 mL | Freq: Once | INTRAMUSCULAR | Status: AC
  Administered 2021-12-26: 6 mL via INTRAVENOUS

## 2021-12-26 NOTE — Patient Instructions (Signed)

## 2021-12-26 NOTE — Progress Notes (Signed)
Botox Injection for spasticity using needle EMG guidance  Dilution: 50 Units/ml Indication: Severe spasticity which interferes with ADL,mobility and/or  hygiene and is unresponsive to medication management and other conservative care Informed consent was obtained after describing risks and benefits of the procedure with the patient. This includes bleeding, bruising, infection, excessive weakness, or medication side effects. A REMS form is on file and signed. Needle: 27g 1" needle electrode Number of units per muscle LEFT UE Biceps50  LEFT LE  Medial Hamstrings250 All injections were done after obtaining appropriate EMG activity and after negative drawback for blood. The patient tolerated the procedure well. Post procedure instructions were given. A followup appointment was made.     

## 2021-12-29 LAB — DRUG TOX MONITOR 1 W/CONF, ORAL FLD
Amphetamines: NEGATIVE ng/mL (ref <10)
Barbiturates: NEGATIVE ng/mL (ref <10)
Benzodiazepines: NEGATIVE ng/mL (ref <0.50)
Buprenorphine: NEGATIVE ng/mL (ref <0.10)
Cocaine: NEGATIVE ng/mL (ref <5.0)
Fentanyl: NEGATIVE ng/mL (ref <0.10)
Heroin Metabolite: NEGATIVE ng/mL (ref <1.0)
MARIJUANA: NEGATIVE ng/mL (ref <2.5)
MDMA: NEGATIVE ng/mL (ref <10)
Meprobamate: NEGATIVE ng/mL (ref <2.5)
Methadone: NEGATIVE ng/mL (ref <5.0)
Nicotine Metabolite: NEGATIVE ng/mL (ref <5.0)
Opiates: NEGATIVE ng/mL (ref <2.5)
Phencyclidine: NEGATIVE ng/mL (ref <10)
Tapentadol: NEGATIVE ng/mL (ref <5.0)
Tramadol: 398.2 ng/mL — ABNORMAL HIGH (ref <5.0)
Tramadol: POSITIVE ng/mL — AB (ref <5.0)
Zolpidem: NEGATIVE ng/mL (ref <5.0)

## 2021-12-29 LAB — DRUG TOX ALC METAB W/CON, ORAL FLD: Alcohol Metabolite: NEGATIVE ng/mL (ref <25)

## 2021-12-31 ENCOUNTER — Other Ambulatory Visit: Payer: Self-pay | Admitting: Family

## 2022-01-06 ENCOUNTER — Telehealth: Payer: Self-pay | Admitting: *Deleted

## 2022-01-06 NOTE — Telephone Encounter (Signed)
Oral swab drug screen was consistent for prescribed medications.  ?

## 2022-01-13 ENCOUNTER — Telehealth: Payer: Self-pay | Admitting: Family

## 2022-01-13 NOTE — Telephone Encounter (Signed)
Called home and cell numbers but unable to reach and no voice mail option

## 2022-01-13 NOTE — Telephone Encounter (Signed)
Please contact pt to schedule follow up.  I have an oxygen form that I need to fill out in addition to updating his labs.

## 2022-01-14 ENCOUNTER — Other Ambulatory Visit: Payer: Self-pay | Admitting: Family

## 2022-01-14 NOTE — Telephone Encounter (Signed)
Lvm on cell listed on his demographics as [phone]

## 2022-01-16 NOTE — Telephone Encounter (Signed)
Patient scheduled for next Tuesday at 11am

## 2022-01-21 ENCOUNTER — Ambulatory Visit (INDEPENDENT_AMBULATORY_CARE_PROVIDER_SITE_OTHER): Payer: Medicare Other | Admitting: Family

## 2022-01-21 ENCOUNTER — Encounter: Payer: Self-pay | Admitting: Family

## 2022-01-21 VITALS — BP 130/67 | HR 57 | Temp 98.0°F | Resp 16 | Wt 237.0 lb

## 2022-01-21 DIAGNOSIS — I10 Essential (primary) hypertension: Secondary | ICD-10-CM | POA: Diagnosis not present

## 2022-01-21 DIAGNOSIS — I5032 Chronic diastolic (congestive) heart failure: Secondary | ICD-10-CM

## 2022-01-21 DIAGNOSIS — E538 Deficiency of other specified B group vitamins: Secondary | ICD-10-CM | POA: Diagnosis not present

## 2022-01-21 DIAGNOSIS — D509 Iron deficiency anemia, unspecified: Secondary | ICD-10-CM

## 2022-01-21 DIAGNOSIS — I25118 Atherosclerotic heart disease of native coronary artery with other forms of angina pectoris: Secondary | ICD-10-CM

## 2022-01-21 DIAGNOSIS — Z8739 Personal history of other diseases of the musculoskeletal system and connective tissue: Secondary | ICD-10-CM

## 2022-01-21 DIAGNOSIS — K219 Gastro-esophageal reflux disease without esophagitis: Secondary | ICD-10-CM | POA: Diagnosis not present

## 2022-01-21 DIAGNOSIS — J309 Allergic rhinitis, unspecified: Secondary | ICD-10-CM | POA: Diagnosis not present

## 2022-01-21 DIAGNOSIS — Z8639 Personal history of other endocrine, nutritional and metabolic disease: Secondary | ICD-10-CM | POA: Diagnosis not present

## 2022-01-21 DIAGNOSIS — R197 Diarrhea, unspecified: Secondary | ICD-10-CM | POA: Insufficient documentation

## 2022-01-21 DIAGNOSIS — Z23 Encounter for immunization: Secondary | ICD-10-CM | POA: Diagnosis not present

## 2022-01-21 DIAGNOSIS — R0902 Hypoxemia: Secondary | ICD-10-CM

## 2022-01-21 DIAGNOSIS — I2510 Atherosclerotic heart disease of native coronary artery without angina pectoris: Secondary | ICD-10-CM

## 2022-01-21 DIAGNOSIS — I480 Paroxysmal atrial fibrillation: Secondary | ICD-10-CM

## 2022-01-21 DIAGNOSIS — Z9861 Coronary angioplasty status: Secondary | ICD-10-CM

## 2022-01-21 LAB — HEMOGLOBIN A1C: Hgb A1c MFr Bld: 6 % (ref 4.6–6.5)

## 2022-01-21 LAB — TSH: TSH: 1.34 u[IU]/mL (ref 0.35–5.50)

## 2022-01-21 LAB — VITAMIN B12: Vitamin B-12: >1500 pg/mL — ABNORMAL HIGH (ref 211–911)

## 2022-01-21 NOTE — Assessment & Plan Note (Signed)
Stable with rare use of furosemide.

## 2022-01-21 NOTE — Assessment & Plan Note (Signed)
Maintained on pantoprazole.  Stable. Continue same.

## 2022-01-21 NOTE — Assessment & Plan Note (Signed)
Maintained on Eliquis.  Rate stable.  Management per cardiology.

## 2022-01-21 NOTE — Assessment & Plan Note (Signed)
BP Readings from Last 3 Encounters:  01/21/22 130/67  12/26/21 (!) 145/75  11/03/21 119/70   On Benazapril '10mg'$  and '5mg'$  together, metoprolol, diltiazem. BP stable.

## 2022-01-21 NOTE — Assessment & Plan Note (Signed)
Stable without medication. Monitor.  

## 2022-01-21 NOTE — Assessment & Plan Note (Signed)
Maintained on lipitor.

## 2022-01-21 NOTE — Assessment & Plan Note (Signed)
Stable on allopurinol.  Continue same.  

## 2022-01-21 NOTE — Assessment & Plan Note (Signed)
Maintained on iron '325mg'$  bid.    Check iron level and blood count.   Lab Results  Component Value Date   WBC 9.0 11/03/2021   HGB 15.7 11/03/2021   HCT 48.4 11/03/2021   MCV 94.9 11/03/2021   PLT 183 11/03/2021

## 2022-01-21 NOTE — Assessment & Plan Note (Signed)
Continues oral supplement. Obtain follow up b12 level.

## 2022-01-21 NOTE — Assessment & Plan Note (Signed)
Lab Results  Component Value Date   HGBA1C 5.9 (H) 08/28/2021   HGBA1C 5.6 01/18/2021   HGBA1C 5.9 05/04/2020   Lab Results  Component Value Date   MICROALBUR <0.7 09/26/2014   LDLCALC 71 03/05/2021   CREATININE 1.11 11/03/2021   Has been stable without medications. Check follow up A1C.

## 2022-01-21 NOTE — Progress Notes (Signed)
Subjective:   By signing my name below, I, Carylon Perches, attest that this documentation has been prepared under the direction and in the presence of [place], NP 01/21/2022   Patient ID: [name], male    DOB: 10-31-1945, 76 y.o.   MRN: [id number]  Chief Complaint  Patient presents with   Oxygen therapy    Here for oxygen therapy reevaluation, uses 2 Litters at night    HPI Patient is in today for an office visit  Hemorrhoid: He reports that he had a hemorrhoidectomy on 09/05/2021 and reports that results were well. However, he is advised to not consume dairy products. When he does consume dairy, he experiences frequent diarrhea. He reports that he took a laxative on the night of 01/20/2022 due to constipation. He produced a bowel movement that night and the morning of this appointment. He does not like consuming water and rather drinks unsweetened tea frequently.   Blood Pressure: His blood pressure as of today's visit is normal. He is currently taking a 5 mg table and 10 mg tablet of Benazepril.  BP Readings from Last 3 Encounters:  01/21/22 130/67  12/26/21 (!) 145/75  11/03/21 119/70   Pulse Readings from Last 3 Encounters:  01/21/22 (!) 57  12/26/21 60  11/03/21 61   Edema: He takes his 40 mg of Lasix medication rarely. He reports of no lower leg extremity swelling at this moment.   Oxygen Orders: He is requesting a renewal of his nighttime oxygen. His pulmonologist originally prescribed him his oxygen tank.   Skin: He reports that his skin is thin. He is currently taking 5 mg of Eliquis.   Heartburn: His symptoms are controlled. He is currently taking 40 mg of Pantoprazole  Allergies: His allergies are controlled.  Cardiologist: He is regularly following up with his cardiologist. He is scheduled for an appointment on 02/18/2022  Iron: He is currently taking two tablets of his iron supplements daily.   Gout: His symptoms are controlled. He is  currently taking 100 mg of Allopurinol to alleviate symptoms  Vitamin B-12: He is currently taking vitamin B12 supplements.   Thyroid: He is currently taking 112 mcg of Synthroid.  Lab Results  Component Value Date   TSH 1.47 01/18/2021   T4TOTAL 7.0 04/02/2009   Vision: He is UTD on his eye exams  Immunizations: He is UTD on his influenza vaccine.   Health Maintenance Due  Topic Date Due   OPHTHALMOLOGY EXAM  10/16/2014   Diabetic kidney evaluation - Urine ACR  09/26/2015   Medicare Annual Wellness (AWV)  05/21/2017   COVID-19 Vaccine (3 - Pfizer risk series) 06/29/2019   FOOT EXAM  01/23/2021    Past Medical History:  Diagnosis Date   Acute encephalopathy 12/12/2015   Acute respiratory failure with hypoxia and hypercapnia (HCC) 12/12/2015   Allergic rhinitis 02/13/2010   Qualifier: Diagnosis of  By: Wynona Luna    Angina at rest 10/22/2015   Arthritis    Ataxia 08/10/2013   Atrial fibrillation ([place]) 11/2018   B12 deficiency 08/11/2013   BACK PAIN, LUMBAR 01/29/2009   Qualifier: Diagnosis of  By: Wynona Luna    BENIGN POSITIONAL VERTIGO 11/23/2009   Qualifier: Diagnosis of  By: Wynona Luna    Chronic diastolic heart failure ([place]) 04/24/2015   Chronic pain    left sided-[place]   Coronary 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 ([place]) 11/04/2015   s/p  CABG 2004; CFX DES 2011   Essential hypertension 12/12/2006   Qualifier: Diagnosis of  By: [name] RMA, [place]     GERD 12/12/2006   Qualifier: Diagnosis of  By: [name] RMA, [place]     GOUT 12/12/2006   Qualifier: Diagnosis of  By: [name] RMA, [place]     Hemorrhoids    Hyperlipidemia LDL goal <70 12/12/2006   Qualifier: Diagnosis of  By: [name] RMA, [place]      Hypogonadism male 04/10/2011   Hypothyroidism, acquired 02/15/2013   Insomnia 05/21/2013   Long term current use of anticoagulant therapy 06/24/2011   Myocardial infarction ([place])    Obstructive sleep apnea-Failed CPAP 06/17/2007    Failed CPAP Using O 2 for sleep    Paroxysmal atrial fibrillation ([place]) 12/06/2016   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   also 1996; resultant hemiplegia of dominant side   Thoracic aorta atherosclerosis ([place]) 04/27/2017   Tubular adenoma of colon 05/2010   Type 2 diabetes mellitus without complication, without long-term current use of insulin ([place]) 10/05/2007   Qualifier: Diagnosis of  By: Wynona Luna    Vasovagal syncope    Vasovagal syncope    Wears glasses     Past Surgical History:  Procedure Laterality Date   CARDIAC CATHETERIZATION N/A 10/25/2015   Procedure: Left Heart Cath and Cors/Grafts Angiography;  Surgeon: Troy Sine, MD;  Location: [facility];  Service: Cardiovascular;  Laterality: N/A;   CARDIOVERSION  06/20/2011   Procedure: CARDIOVERSION;  Surgeon: Larey Dresser, MD;  Location: [facility];  Service: Cardiovascular;  Laterality: N/A;   CARDIOVERSION N/A 04/26/2015   Procedure: CARDIOVERSION;  Surgeon: Dorothy Spark, MD;  Location: [facility];  Service: Cardiovascular;  Laterality: N/A;   CARDIOVERSION N/A 05/10/2015   Procedure: CARDIOVERSION;  Surgeon: Larey Dresser, MD;  Location: [facility];  Service: Cardiovascular;  Laterality: N/A;   CARDIOVERSION N/A 12/10/2018   Procedure: CARDIOVERSION;  Surgeon: Sanda Klein, MD;  Location: [place] [facility];  Service: Cardiovascular;  Laterality: N/A;   CATARACT EXTRACTION  05/2010   left eye   CATARACT EXTRACTION  04/2010   right eye   COLONOSCOPY WITH PROPOFOL N/A 02/22/2020   Procedure: COLONOSCOPY WITH PROPOFOL;  Surgeon: Lavena Bullion, DO;  Location: [facility];  Service: Gastroenterology;  Laterality: N/A;   CORONARY ARTERY BYPASS GRAFT     stent   ESOPHAGOGASTRODUODENOSCOPY  03-25-2005   HEMORRHOID SURGERY N/A 09/05/2021   Procedure: HEMORRHOIDECTOMY WITH LIGATION AND HEMORRHOIDOPEXY;  Surgeon: Michael Boston, MD;  Location: [facility];  Service: General;  Laterality: N/A;   LEFT HEART CATH AND  CORS/GRAFTS ANGIOGRAPHY N/A 02/19/2018   Procedure: LEFT HEART CATH AND CORS/GRAFTS ANGIOGRAPHY;  Surgeon: Martinique, Peter M, MD;  Location: [facility];  Service: Cardiovascular;  Laterality: N/A;   NASAL SEPTUM SURGERY     PERCUTANEOUS PLACEMENT INTRAVASCULAR STENT CERVICAL CAROTID ARTERY     03-2009; using a drug-eluting platform of the circumflex cornoray artery with a 3.0 x 18 [employer] Promus drug-eluting platform post dilated to 3.75 with a noncompliant balloon.   POLYPECTOMY  02/22/2020   Procedure: POLYPECTOMY;  Surgeon: Lavena Bullion, DO;  Location: [facility];  Service: Gastroenterology;;   RECTAL EXAM UNDER ANESTHESIA N/A 09/05/2021   Procedure: ANORECTAL EXAM UNDER ANESTHESIA;  Surgeon: Michael Boston, MD;  Location: [facility];  Service: General;  Laterality: N/A;   TEE WITHOUT CARDIOVERSION  06/20/2011   Procedure: TRANSESOPHAGEAL ECHOCARDIOGRAM (TEE);  Surgeon: Larey Dresser, MD;  Location:  [facility];  Service: Cardiovascular;  Laterality: N/A;    Family History  Problem Relation Age of Onset   Lung cancer Father        deceased   Stroke Mother        deceased-MINISTROKES   Colon cancer Neg Hx    Esophageal cancer Neg Hx    Stomach cancer Neg Hx    Pancreatic cancer Neg Hx    Liver disease Neg Hx     Social History   Socioeconomic History   Marital status: Married    Spouse name: [name]   Number of children: Not on file   Years of education: 14   Highest education level: Not on file  Occupational History   Occupation: retired    Fish farm manager: OTHER    Comment: Mining engineer  Tobacco Use   Smoking status: Never   Smokeless tobacco: Never  Vaping Use   Vaping Use: Never used  Substance and Sexual Activity   Alcohol use: Not Currently   Drug use: No   Sexual activity: Not Currently  Other Topics Concern   Not on file  Social History Narrative   Pt lives with wife. Does have stairs, but patient doesn't use them. Pt has completed technical  school   Social Determinants of Health   Financial Resource Strain: Not on file  Food Insecurity: Not on file  Transportation Needs: Not on file  Physical Activity: Not on file  Stress: Not on file  Social Connections: Not on file  Intimate Partner Violence: Not on file    Outpatient Medications Prior to Visit  Medication Sig Dispense Refill   albuterol (VENTOLIN HFA) 108 (90 Base) MCG/ACT inhaler Inhale 1-2 puffs into the lungs every 6 (six) hours as needed for wheezing or shortness of breath.     allopurinol (ZYLOPRIM) 100 MG tablet TAKE 2 TABLETS BY MOUTH EVERY DAY 180 tablet 1   atorvastatin (LIPITOR) 40 MG tablet TAKE 1 TABLET BY MOUTH EVERY DAY 90 tablet 1   benazepril (LOTENSIN) 10 MG tablet TAKE 1 TABLET (10 MG TOTAL) BY MOUTH DAILY. TAKE WITH ADDITIONAL 5MG TABLET OF BENAZEPRIL DAILY 90 tablet 1   benazepril (LOTENSIN) 5 MG tablet TAKE 1 TABLET BY MOUTH DAILY WITH 10MG OF BENAZEPRIL TO EQUAL 15MG DAILY 90 tablet 1   blood glucose meter kit and supplies KIT Dispense based on patient and insurance preference. Use up to four times daily as directed. (FOR ICD-9 250.00, 250.01). 102 each 0   calcium carbonate (TUMS - DOSED IN MG ELEMENTAL CALCIUM) 500 MG chewable tablet Chew 1 tablet by mouth daily as needed for indigestion or heartburn.     Continuous Blood Gluc Receiver (FREESTYLE LIBRE 2 READER) DEVI Use as directed 1 each 0   Continuous Blood Gluc Sensor (FREESTYLE LIBRE 14 DAY SENSOR) MISC USE AS DIRECTED 1 each 5   cyanocobalamin 1000 MCG tablet Take 2,000 mcg by mouth daily.     diltiazem (CARDIZEM CD) 180 MG 24 hr capsule Take 1 capsule (180 mg total) by mouth daily. 90 capsule 3   dofetilide (TIKOSYN) 500 MCG capsule TAKE 1 CAPSULE BY MOUTH 2 TIMES DAILY. 180 capsule 3   ELIQUIS 5 MG TABS tablet TAKE 1 TABLET BY MOUTH TWICE A DAY 180 tablet 1   ferrous sulfate 325 (65 FE) MG tablet Take 1 tablet (325 mg total) by mouth 2 (two) times daily with a meal. 180 tablet 1    fluticasone (FLONASE) 50 MCG/ACT nasal spray Place 1 spray  into both nostrils daily. (Patient taking differently: Place 1 spray into both nostrils daily as needed for allergies.) 16 g 0   folic acid (FOLVITE) 1 MG tablet TAKE 1 TABLET BY MOUTH EVERY DAY 90 tablet 1   furosemide (LASIX) 40 MG tablet TAKE 1 TABLET BY MOUTH EVERY DAY AS NEEDED 90 tablet 1   gabapentin (NEURONTIN) 100 MG capsule TAKE 1 CAPSULE BY MOUTH AT BEDTIME. 90 capsule 1   glucose blood (ACCU-CHEK GUIDE) test strip USE AS DIRECTED UP TO 4 TIMES DAILY 100 strip 0   levothyroxine (SYNTHROID) 112 MCG tablet TAKE 1 TABLET BY MOUTH EVERY DAY BEFORE BREAKFAST 90 tablet 0   methocarbamol (ROBAXIN) 500 MG tablet Take 500 mg by mouth every 8 (eight) hours as needed.     metoprolol succinate (TOPROL-XL) 25 MG 24 hr tablet Take 1 tablet (25 mg total) by mouth daily. 90 tablet 3   Multiple Vitamins-Minerals (MULTIVITAMIN GUMMIES MENS PO) Take 2 tablets by mouth daily with breakfast.      nitroGLYCERIN (NITROSTAT) 0.4 MG SL tablet Place 1 tablet (0.4 mg total) under the tongue every 5 (five) minutes as needed for chest pain. Up to 3 doses 10 tablet 2   OXYGEN Inhale 2 L into the lungs at bedtime.     pantoprazole (PROTONIX) 40 MG tablet TAKE 1 TABLET BY MOUTH EVERY DAY BEFORE BREAKFAST 90 tablet 1   sennosides-docusate sodium (SENOKOT-S) 8.6-50 MG tablet Take 1 tablet by mouth as needed for constipation.     traMADol (ULTRAM) 50 MG tablet TAKE 1 TABLET BY MOUTH TWICE A DAY 180 tablet 1   traMADol (ULTRAM-ER) 200 MG 24 hr tablet TAKE 1 TABLET BY MOUTH EVERY DAY 90 tablet 1   No facility-administered medications prior to visit.    No Known Allergies  ROS See HPI    Objective:    Physical Exam Constitutional:      General: He is not in acute distress.    Appearance: Normal appearance. He is not ill-appearing.  HENT:     Head: Normocephalic and atraumatic.     Right Ear: External ear normal.     Left Ear: External ear normal.   Eyes:     Extraocular Movements: Extraocular movements intact.     Pupils: Pupils are equal, round, and reactive to light.  Cardiovascular:     Rate and Rhythm: Normal rate. Rhythm irregular.     Heart sounds: Normal heart sounds. No murmur heard.    No gallop.  Pulmonary:     Effort: Pulmonary effort is normal. No respiratory distress.     Breath sounds: Normal breath sounds. No wheezing or rales.  Skin:    General: Skin is warm and dry.  Neurological:     Mental Status: He is alert and oriented to person, place, and time.  Psychiatric:        Mood and Affect: Mood normal.        Behavior: Behavior normal.        Judgment: Judgment normal.     BP 130/67 (BP Location: Right Arm, Patient Position: Sitting, Cuff Size: Small)   Pulse (!) 57   Temp 98 F (36.7 C) (Oral)   Resp 16   Wt 237 lb (107.5 kg)   SpO2 96%   BMI 29.62 kg/m  Wt Readings from Last 3 Encounters:  01/21/22 237 lb (107.5 kg)  12/26/21 237 lb (107.5 kg)  11/01/21 236 lb (107 kg)       Assessment &  Plan:   Problem List Items Addressed This Visit       Unprioritized   Paroxysmal atrial fibrillation ([place])    Maintained on Eliquis.  Rate stable.  Management per cardiology.       Iron deficiency anemia    Maintained on iron 360m bid.    Check iron level and blood count.   Lab Results  Component Value Date   WBC 9.0 11/03/2021   HGB 15.7 11/03/2021   HCT 48.4 11/03/2021   MCV 94.9 11/03/2021   PLT 183 11/03/2021        History of type 2 diabetes mellitus    Lab Results  Component Value Date   HGBA1C 5.9 (H) 08/28/2021   HGBA1C 5.6 01/18/2021   HGBA1C 5.9 05/04/2020   Lab Results  Component Value Date   MICROALBUR <0.7 09/26/2014   [id number] 03/05/2021   CREATININE 1.11 11/03/2021  Has been stable without medications. Check follow up A1C.       Relevant Orders   Hemoglobin A1c   Urine Microalbumin w/creat. ratio   History of hypothyroidism    Lab Results  Component Value  Date   TSH 1.47 01/18/2021  Clinically stable on synthroid. Check follow up TSH.       Relevant Orders   TSH   History of gout    Stable on allopurinol. Continue same.       GERD    Maintained on pantoprazole.  Stable. Continue same.       Essential hypertension    BP Readings from Last 3 Encounters:  01/21/22 130/67  12/26/21 (!) 145/75  11/03/21 119/70  On Benazapril 120mand 25m31mogether, metoprolol, diltiazem. BP stable.       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 [place]  Maintained on lipitor.       Chronic diastolic CHF (congestive heart failure) (HCC)    Stable with rare use of furosemide.       B12 deficiency    Continues oral supplement. Obtain follow up b12 level.       Relevant Orders   B12   Allergic rhinitis    Stable without medication. Monitor.       Other Visit Diagnoses     Needs flu shot    -  Primary   Relevant Orders   Flu Vaccine QUAD High Dose(Fluad) (Completed)   Hypoxia       Relevant Orders   Ambulatory referral to Pulmonology      No orders of the defined types were placed in this encounter.   I, MelNance PearP, personally preformed the services described in this documentation.  All medical record entries made by the scribe were at my direction and in my presence.  I have reviewed the chart and discharge instructions (if applicable) and agree that the record reflects my personal performance and is accurate and complete. 01/21/2022   I,Amber Collins,acting as a scribe for MelNance PearP.,have documented all relevant documentation on the behalf of MelNance PearP,as directed by  MelNance PearP while in the presence of MelNance PearP.    MelNance PearP

## 2022-01-21 NOTE — Assessment & Plan Note (Signed)
Lab Results  Component Value Date   TSH 1.47 01/18/2021   Clinically stable on synthroid. Check follow up TSH.

## 2022-02-03 ENCOUNTER — Encounter: Payer: Self-pay | Admitting: Family

## 2022-02-12 ENCOUNTER — Encounter: Payer: Self-pay | Admitting: Pulmonary Disease

## 2022-02-12 ENCOUNTER — Ambulatory Visit (INDEPENDENT_AMBULATORY_CARE_PROVIDER_SITE_OTHER): Payer: Medicare Other | Admitting: Pulmonary Disease

## 2022-02-12 VITALS — BP 112/68 | HR 59 | Ht 75.0 in | Wt 235.4 lb

## 2022-02-12 DIAGNOSIS — G4734 Idiopathic sleep related nonobstructive alveolar hypoventilation: Secondary | ICD-10-CM | POA: Diagnosis not present

## 2022-02-12 NOTE — Progress Notes (Signed)
_0  ID: [name], male    DOB: September 20, 1945, 76 y.o.   MRN: [id number]  Chief Complaint  Patient presents with   Consult    Pt is here for consult for his oxygen being low at night. He states he is here due to him needing his oxygen signed off. He states he uses 2L at night time.     Referring provider: Debbrah Alar, NP  HPI:   76 y.o. man whom are seen in consultation for evaluation of nocturnal hypoxemia.  Most recent pulmonary note 11/2016 with Dr. Lamonte Sakai reviewed.  Most recent PCP note x2 reviewed.  ED note 10/2021 reviewed.  Patient feels fine.  Denies any significant dyspnea.  No breathing issues.  No cough etc.  He does have some nasal congestion.  History of deviated septum that was repaired.  Left side gets dried out.  Improved with Mucinex.  Has tried nasal sprays etc.  Wonders if he can take Mucinex every day.  He is here because he needs oxygen at night.  Looks like he had sleep apnea in the past.  No longer using CPAP it seems.  Using 2 L nasal at night.  When he does not use it he has significant symptoms at night and in the mornings.  Using 2 L.  With good control of the symptoms.  He was told his oxygen, he needs a new order.  His PCP referred him here for this.    CT high-resolution 2018 personally reviewed interpret as clear lungs with elevated right hemidiaphragm and compressive atelectasis, no evidence of ILD or other abnormality.  Left heart cath 2019 reviewed.  Echo 03/05/2018 reviewed which shows dilated LA, grade 1 diastolic 20, normal EF, estimated elevated LVEDP, MVR.  Normal right side.  Most recent chest imaging chest x-ray 11/24/2020 personally reviewed and interpreted as clear lungs with elevated right hemidiaphragm and signs of compressive atelectasis.  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    Questionaires / Pulmonary Flowsheets:   ACT:      No data to display          MMRC:     No data to display          Epworth:      No data to display          Tests:   FENO:  No results found for: "NITRICOXIDE"  PFT:    Latest Ref Rng & Units 11/26/2016    9:38 AM 09/23/2016   11:17 AM  PFT Results  FVC-Pre L 2.60  2.29   FVC-Predicted Pre % 50  44   FVC-Post L 2.46  2.14   FVC-Predicted Post % 47  41   Pre FEV1/FVC % % 81  80   Post FEV1/FCV % % 84  83   FEV1-Pre L 2.10  1.84   FEV1-Predicted Pre % 55  48   FEV1-Post L 2.07  1.78   DLCO uncorrected ml/min/mmHg 21.56  23.76   DLCO UNC% % 55  60   DLCO corrected ml/min/mmHg 23.77  23.19   DLCO COR %Predicted % 60  59   DLVA Predicted % 136  106   TLC L 3.85  4.32   TLC % Predicted % 47  53  RV % Predicted % 61  70   Personally reviewed, spirometry interpreted as moderate to severe restriction versus air trapping.  Lung volumes reveal severe restriction with evidence of air trapping.  DLCO moderately reduced.  WALK:      No data to display          Imaging: Personally reviewed and as per EMR and discussion in this note No results found.  Lab Results: Personally reviewed CBC    Component Value Date/Time   WBC 9.0 11/03/2021 0612   RBC 5.10 11/03/2021 0612   HGB 15.7 11/03/2021 0612   HGB 13.6 04/14/2019 1117   HCT 48.4 11/03/2021 0612   HCT 39.4 04/14/2019 1117   PLT 183 11/03/2021 0612   PLT 158 04/14/2019 1117   MCV 94.9 11/03/2021 0612   MCV 88 04/14/2019 1117   MCH 30.8 11/03/2021 0612   MCHC 32.4 11/03/2021 0612   RDW 12.9 11/03/2021 0612   RDW 13.9 04/14/2019 1117   LYMPHSABS 2.1 12/11/2020 1340   MONOABS 0.6 12/11/2020 1340   EOSABS 0.2 12/11/2020 1340   BASOSABS 0.1 12/11/2020 1340    BMET    Component Value Date/Time   NA 139 11/03/2021 0612   NA 138 02/22/2021 0827    K 4.1 11/03/2021 0612   CL 106 11/03/2021 0612   CO2 24 11/03/2021 0612   GLUCOSE 142 (H) 11/03/2021 0612   BUN 19 11/03/2021 0612   BUN 21 02/22/2021 0827   CREATININE 1.11 11/03/2021 0612   CREATININE 1.27 (H) 11/05/2015 1532   CALCIUM 9.8 11/03/2021 0612   GFRNONAA >60 11/03/2021 0612   GFRNONAA 73 06/25/2012 0826   GFRAA 75 10/04/2019 1041   GFRAA 84 06/25/2012 0826    BNP    Component Value Date/Time   BNP 112.5 (H) 11/24/2020 2236   BNP 82.2 11/05/2015 1532    ProBNP    Component Value Date/Time   PROBNP 1,910.0 (H) 06/17/2011 1729    Specialty Problems       Pulmonary Problems   Obstructive sleep apnea-Failed CPAP    Failed CPAP Using O 2 for sleep      Allergic rhinitis    Qualifier: Diagnosis of  By: Wynona Luna       Acute respiratory failure with hypoxia and hypercapnia (HCC)   Dyspnea on exertion    No Known Allergies  Immunization History  Administered Date(s) Administered   Fluad Quad(high Dose 65+) 11/29/2018, 01/24/2020, 12/11/2020, 01/21/2022   Influenza Split 12/16/2010, 03/05/2012   Influenza Whole 12/21/2007, 12/26/2008, 12/20/2009, 12/26/2009   Influenza, High Dose Seasonal PF 01/02/2017, 01/27/2018   Influenza,inj,Quad PF,6+ Mos 12/17/2012, 12/02/2013, 12/01/2014, 12/13/2015   Influenza-Unspecified 11/25/2018   PFIZER(Purple Top)SARS-COV-2 Vaccination 05/08/2019, 06/01/2019   Pneumococcal Conjugate-13 02/15/2013   Pneumococcal Polysaccharide-23 03/21/2009, 08/05/2019   Td 01/29/2009   Tdap 06/27/2013, 05/24/2020   Zoster Recombinat (Shingrix) 01/12/2020, 06/07/2020   Zoster, Live 11/26/2010    Past Medical History:  Diagnosis Date   Acute encephalopathy 12/12/2015   Acute respiratory failure with hypoxia and hypercapnia ([place]) 12/12/2015   Allergic rhinitis 02/13/2010   Qualifier: Diagnosis of  By: Wynona Luna    Angina at rest 10/22/2015   Arthritis    Ataxia 08/10/2013   Atrial fibrillation ([place]) 11/2018    B12 deficiency 08/11/2013   BACK PAIN, LUMBAR 01/29/2009   Qualifier: Diagnosis of  By: Wynona Luna    BENIGN POSITIONAL VERTIGO 11/23/2009   Qualifier: Diagnosis of  By: Shawna Orleans DO,  Sandy Salaam    Chronic diastolic heart failure ([place]) 04/24/2015   Chronic pain    left sided-[place]   Coronary 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 ([place]) 11/04/2015   s/p CABG 2004; CFX DES 2011   Essential hypertension 12/12/2006   Qualifier: Diagnosis of  By: [name] RMA, [place]     GERD 12/12/2006   Qualifier: Diagnosis of  By: [name] RMA, [place]     GOUT 12/12/2006   Qualifier: Diagnosis of  By: [name] RMA, [place]     Hemorrhoids    Hyperlipidemia LDL goal <70 12/12/2006   Qualifier: Diagnosis of  By: [name] RMA, [place]      Hypogonadism male 04/10/2011   Hypothyroidism, acquired 02/15/2013   Insomnia 05/21/2013   Long term current use of anticoagulant therapy 06/24/2011   Myocardial infarction ([place])    Obstructive sleep apnea-Failed CPAP 06/17/2007   Failed CPAP Using O 2 for sleep    Paroxysmal atrial fibrillation ([place]) 12/06/2016   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   also 1996; resultant hemiplegia of dominant side   Thoracic aorta atherosclerosis ([place]) 04/27/2017   Tubular adenoma of colon 05/2010   Type 2 diabetes mellitus without complication, without long-term current use of insulin ([place]) 10/05/2007   Qualifier: Diagnosis of  By: Wynona Luna    Vasovagal syncope    Vasovagal syncope    Wears glasses     Tobacco History: Social History   Tobacco Use  Smoking Status Never  Smokeless Tobacco Never   Counseling given: Not Answered   Continue to not smoke  Outpatient Encounter Medications as of 02/12/2022  Medication Sig   albuterol (VENTOLIN HFA) 108 (90 Base) MCG/ACT inhaler Inhale 1-2 puffs into the lungs every 6 (six) hours as needed for wheezing or shortness of breath.   allopurinol (ZYLOPRIM) 100 MG tablet TAKE 2 TABLETS BY MOUTH EVERY DAY    atorvastatin (LIPITOR) 40 MG tablet TAKE 1 TABLET BY MOUTH EVERY DAY   benazepril (LOTENSIN) 10 MG tablet TAKE 1 TABLET (10 MG TOTAL) BY MOUTH DAILY. TAKE WITH ADDITIONAL 5MG TABLET OF BENAZEPRIL DAILY   benazepril (LOTENSIN) 5 MG tablet TAKE 1 TABLET BY MOUTH DAILY WITH 10MG OF BENAZEPRIL TO EQUAL 15MG DAILY   blood glucose meter kit and supplies KIT Dispense based on patient and insurance preference. Use up to four times daily as directed. (FOR ICD-9 250.00, 250.01).   calcium carbonate (TUMS - DOSED IN MG ELEMENTAL CALCIUM) 500 MG chewable tablet Chew 1 tablet by mouth daily as needed for indigestion or heartburn.   Continuous Blood Gluc Receiver (FREESTYLE LIBRE 2 READER) DEVI Use as directed   Continuous Blood Gluc Sensor (FREESTYLE LIBRE 14 DAY SENSOR) MISC USE AS DIRECTED   cyanocobalamin 1000 MCG tablet Take 2,000 mcg by mouth daily.   diltiazem (CARDIZEM CD) 180 MG 24 hr capsule Take 1 capsule (180 mg total) by mouth daily.   dofetilide (TIKOSYN) 500 MCG capsule TAKE 1 CAPSULE BY MOUTH 2 TIMES DAILY.   ELIQUIS 5 MG TABS tablet TAKE 1 TABLET BY MOUTH TWICE A DAY   ferrous sulfate 325 (65 FE) MG tablet Take 1 tablet (325 mg total) by mouth 2 (two) times daily with a meal.   fluticasone (FLONASE) 50 MCG/ACT nasal spray Place 1 spray into both nostrils daily. (Patient taking differently: Place 1 spray into both nostrils daily as needed for allergies.)   folic acid (FOLVITE) 1 MG tablet TAKE 1 TABLET BY MOUTH EVERY DAY   furosemide (  LASIX) 40 MG tablet TAKE 1 TABLET BY MOUTH EVERY DAY AS NEEDED   gabapentin (NEURONTIN) 100 MG capsule TAKE 1 CAPSULE BY MOUTH AT BEDTIME.   glucose blood (ACCU-CHEK GUIDE) test strip USE AS DIRECTED UP TO 4 TIMES DAILY   levothyroxine (SYNTHROID) 112 MCG tablet TAKE 1 TABLET BY MOUTH EVERY DAY BEFORE BREAKFAST   methocarbamol (ROBAXIN) 500 MG tablet Take 500 mg by mouth every 8 (eight) hours as needed.   metoprolol succinate (TOPROL-XL) 25 MG 24 hr tablet Take 1  tablet (25 mg total) by mouth daily.   Multiple Vitamins-Minerals (MULTIVITAMIN GUMMIES MENS PO) Take 2 tablets by mouth daily with breakfast.    nitroGLYCERIN (NITROSTAT) 0.4 MG SL tablet Place 1 tablet (0.4 mg total) under the tongue every 5 (five) minutes as needed for chest pain. Up to 3 doses   OXYGEN Inhale 2 L into the lungs at bedtime.   pantoprazole (PROTONIX) 40 MG tablet TAKE 1 TABLET BY MOUTH EVERY DAY BEFORE BREAKFAST   sennosides-docusate sodium (SENOKOT-S) 8.6-50 MG tablet Take 1 tablet by mouth as needed for constipation.   traMADol (ULTRAM) 50 MG tablet TAKE 1 TABLET BY MOUTH TWICE A DAY   traMADol (ULTRAM-ER) 200 MG 24 hr tablet TAKE 1 TABLET BY MOUTH EVERY DAY   No facility-administered encounter medications on file as of 02/12/2022.     Review of Systems  Review of Systems  No chest pain with exertion.  No orthopnea or PND.  Comprehensive review of systems otherwise negative. Physical Exam  BP 112/68 (BP Location: Left Arm, Patient Position: Sitting, Cuff Size: Normal)   Pulse (!) 59   Ht _0  (1.905 m)   Wt 235 lb 6.4 oz (106.8 kg)   SpO2 94%   BMI 29.42 kg/m   Wt Readings from Last 5 Encounters:  02/12/22 235 lb 6.4 oz (106.8 kg)  01/21/22 237 lb (107.5 kg)  12/26/21 237 lb (107.5 kg)  11/01/21 236 lb (107 kg)  09/05/21 244 lb 14.9 oz (111.1 kg)    BMI Readings from Last 5 Encounters:  02/12/22 29.42 kg/m  01/21/22 29.62 kg/m  12/26/21 29.62 kg/m  11/01/21 29.50 kg/m  09/05/21 30.61 kg/m     Physical Exam General: Sitting in chair, no acute stress Eyes: EOMI, no icterus Neck: Supple, no JVP Pulmonary: Clear, normal work of breathing, diminished right base Cardiovascular: Warm, no edema Abdomen: Nondistended, bowel sounds present MSK: No synovitis, no joint effusion Neuro: Slow gait, ambulates with walker, chronic deficits from stroke noted on left Psych: Normal mood, full affect   Assessment & Plan:   Nocturnal hypoxemia: History  of OSA, not using CPAP.  Using 2 L nasal cannula at night.  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  Evidence of elevated LVEDP in the past.  New order for 2 L nasal cannula to be worn at night placed today.  Elevated right hemidiaphragm: Present for years.  Denies dyspnea.  No further work-up.  Restrictive pattern on prior PFTs: Unclear if actual numbers used for reference were normal given how large they are for a man that age.  He is tall so may be.  However he had CT scan high-resolution 2018 revealed no evidence of ILD.  Suspect values were inaccurate.  Nasal congestion: Improved with Mucinex.  Encouraged him to continue long-term given improvement.  Return in about 6 months (around 08/13/2022).   Lanier Clam, MD 02/12/2022

## 2022-02-12 NOTE — Patient Instructions (Signed)
Nice to meet you  We are going to send an order in to use oxygen 2 L at night.  Is possible insurance or oxygen company fights [place] and forces [place] to do overnight oximetry test you can do at home.  If so we will contact you and make arrangements.  Return to clinic in 6 months or sooner as needed with Dr. Silas Flood

## 2022-02-18 ENCOUNTER — Ambulatory Visit: Payer: Medicare Other | Attending: Cardiology | Admitting: Cardiology

## 2022-02-18 ENCOUNTER — Encounter: Payer: Self-pay | Admitting: Cardiology

## 2022-02-18 VITALS — BP 128/68 | HR 65 | Ht 75.0 in | Wt 238.0 lb

## 2022-02-18 DIAGNOSIS — I4819 Other persistent atrial fibrillation: Secondary | ICD-10-CM | POA: Insufficient documentation

## 2022-02-18 DIAGNOSIS — Z9861 Coronary angioplasty status: Secondary | ICD-10-CM

## 2022-02-18 DIAGNOSIS — I2510 Atherosclerotic heart disease of native coronary artery without angina pectoris: Secondary | ICD-10-CM | POA: Insufficient documentation

## 2022-02-18 DIAGNOSIS — E785 Hyperlipidemia, unspecified: Secondary | ICD-10-CM | POA: Insufficient documentation

## 2022-02-18 NOTE — Patient Instructions (Signed)
Medication Instructions:  Your physician recommends that you continue on your current medications as directed. Please refer to the Current Medication list given to you today.  *If you need a refill on your cardiac medications before your next appointment, please call your pharmacy*   Lab Work: None ordered.  If you have labs (blood work) drawn today and your tests are completely normal, you will receive your results only by: [place] (if you have MyChart) OR A paper copy in the mail If you have any lab test that is abnormal or we need to change your treatment, we will call you to review the results.   Testing/Procedures: None ordered.    Follow-Up: At [facility], you and your health needs are our priority.  As part of our continuing mission to provide you with exceptional heart care, we have created designated Provider Care Teams.  These Care Teams include your primary Cardiologist (physician) and Advanced Practice Providers (APPs -  Physician Assistants and Nurse Practitioners) who all work together to provide you with the care you need, when you need it.  We recommend signing up for the patient portal called "MyChart".  Sign up information is provided on this After Visit Summary.  MyChart is used to connect with patients for Virtual Visits (Telemedicine).  Patients are able to view lab/test results, encounter notes, upcoming appointments, etc.  Non-urgent messages can be sent to your provider as well.   To learn more about what you can do with MyChart, go to [email or web address].    Your next appointment:   12 months with [name]  Important Information About Sugar

## 2022-02-18 NOTE — Progress Notes (Signed)
Cardiology Office Note:    Date:  02/18/2022   ID:  [name], DOB [birth year], MRN [id number]  PCP:  Debbrah Alar, NP   [facility] Providers Cardiologist:  Candee Furbish, MD Electrophysiologist:  Virl Axe, MD     Referring MD: Debbrah Alar, NP     History of Present Illness:    [name] is a 76 y.o. male with a hx of CAD s/p CABG, chronic diastolic heart failure, hypertension, hyperlipidemia, PAF, OSA failed CPAP, CVA, and hx of amiodarone lung toxicity. Last heart cath 2019 with 3 out of 4 patent grafts: Chronic occlusion of the SVG-LCx.   He has followed with the Afib clinic with prior cardioversions and hx of amiodarone lung toxicity.  1 cardioversion was complicated by significant chest wall bleeding and hematoma.   He was loaded with dofetilide 5/18-5/21/21 and converted on this medication. He is anticoagulated with eliquis.   He was seen by [facility] 05/2020 for foot pain felt related to neuropathy, no evidence of PAD.   He was seen last for pre op hemorrhoidectomy. Had a rough recovery 12 weeks.  He denied chest pain. He walks 500 ft, uses a walker very seldomly. He reports he can climb stairs and did so 2-3 weeks ago. He works in his garage He does moderate housework at home (vacuuming). [place] also fixing lawn mower. He just replaced the tread on his deck stairs. He denies angina or symptoms of heart failure.  Enjoys [employer].    Past Medical History:  Diagnosis Date   Acute encephalopathy 12/12/2015   Acute respiratory failure with hypoxia and hypercapnia (HCC) 12/12/2015   Allergic rhinitis 02/13/2010   Qualifier: Diagnosis of  By: Wynona Luna    Angina at rest 10/22/2015   Arthritis    Ataxia 08/10/2013   Atrial fibrillation ([place]) 11/2018   B12 deficiency 08/11/2013   BACK PAIN, LUMBAR 01/29/2009   Qualifier: Diagnosis of  By: Wynona Luna    BENIGN POSITIONAL VERTIGO 11/23/2009   Qualifier: Diagnosis of  By: Wynona Luna    Chronic diastolic heart failure ([place]) 04/24/2015   Chronic pain    left sided-[place]   Coronary 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 ([place]) 11/04/2015   s/p CABG 2004; CFX DES 2011   Essential hypertension 12/12/2006   Qualifier: Diagnosis of  By: [name] RMA, [place]     GERD 12/12/2006   Qualifier: Diagnosis of  By: Reatha Armour, [place]     GOUT 12/12/2006   Qualifier: Diagnosis of  By: [name] RMA, [place]     Hemorrhoids    Hyperlipidemia LDL goal <70 12/12/2006   Qualifier: Diagnosis of  By: [name] RMA, [place]      Hypogonadism male 04/10/2011   Hypothyroidism, acquired 02/15/2013   Insomnia 05/21/2013   Long term current use of anticoagulant therapy 06/24/2011   Myocardial infarction ([place])    Obstructive sleep apnea-Failed CPAP 06/17/2007   Failed CPAP Using O 2 for sleep    Paroxysmal atrial fibrillation ([place]) 12/06/2016   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   also 1996; resultant hemiplegia of dominant side   Thoracic aorta atherosclerosis ([place]) 04/27/2017   Tubular adenoma of colon 05/2010   Type 2 diabetes mellitus without complication, without long-term current use of insulin ([place]) 10/05/2007   Qualifier: Diagnosis of  By: Wynona Luna    Vasovagal syncope    Vasovagal syncope    Wears glasses  Past Surgical History:  Procedure Laterality Date   CARDIAC CATHETERIZATION N/A 10/25/2015   Procedure: Left Heart Cath and Cors/Grafts Angiography;  Surgeon: Troy Sine, MD;  Location: [place] [facility];  Service: Cardiovascular;  Laterality: N/A;   CARDIOVERSION  06/20/2011   Procedure: CARDIOVERSION;  Surgeon: Larey Dresser, MD;  Location: [place];  Service: Cardiovascular;  Laterality: N/A;   CARDIOVERSION N/A 04/26/2015   Procedure: CARDIOVERSION;  Surgeon: Dorothy Spark, MD;  Location[facility] END[facility];  Service: Cardiovascular;  Laterality: N/A;   CARDIOVERSION N/A 05/10/2015   Procedure: CARDIOVERSION;  Surgeon: Larey Dresser, MD;   Location: [place];  Service: Cardiovascular;  Laterality: N/A;   CARDIOVERSION N/A 12/10/2018   Procedure: CARDIOVERSION;  Surgeon: Sanda Klein, MD;  Location: [place];  Service: Cardiovascular;  Laterality: N/A;   CATARACT EXTRACTION  05/2010   left eye   CATARACT EXTRACTION  04/2010   right eye   COLONOSCOPY WITH PROPOFOL N/A 02/22/2020   Procedure: COLONOSCOPY WITH PROPOFOL;  Surgeon: Lavena Bullion, DO;  Location: [facility] ENDOSCOPY;  Service: Gastroenterology;  Laterality: N/A;   CORONARY ARTERY BYPASS GRAFT     stent   ESOPHAGOGASTRODUODENOSCOPY  03-25-2005   HEMORRHOID SURGERY N/A 09/05/2021   Procedure: HEMORRHOIDECTOMY WITH LIGATION AND HEMORRHOIDOPEXY;  Surgeon: Michael Boston, MD;  Location: [facility];  Service: General;  Laterality: N/A;   LEFT HEART CATH AND CORS/GRAFTS ANGIOGRAPHY N/A 02/19/2018   Procedure: LEFT HEART CATH AND CORS/GRAFTS ANGIOGRAPHY;  Surgeon: Martinique, Peter M, MD;  Location: [place] [facility];  Service: Cardiovascular;  Laterality: N/A;   NASAL SEPTUM SURGERY     PERCUTANEOUS PLACEMENT INTRAVASCULAR STENT CERVICAL CAROTID ARTERY     03-2009; using a drug-eluting platform of the circumflex cornoray artery with a 3.0 x 18 [employer] Promus drug-eluting platform post dilated to 3.75 with a noncompliant balloon.   POLYPECTOMY  02/22/2020   Procedure: POLYPECTOMY;  Surgeon: Lavena Bullion, DO;  Location: [facility];  Service: Gastroenterology;;   RECTAL EXAM UNDER ANESTHESIA N/A 09/05/2021   Procedure: ANORECTAL EXAM UNDER ANESTHESIA;  Surgeon: Michael Boston, MD;  Location: [facility];  Service: General;  Laterality: N/A;   TEE WITHOUT CARDIOVERSION  06/20/2011   Procedure: TRANSESOPHAGEAL ECHOCARDIOGRAM (TEE);  Surgeon: Larey Dresser, MD;  Location: [facility] ENDOSCOPY;  Service: Cardiovascular;  Laterality: N/A;    Current Medications: Current Meds  Medication Sig   albuterol (VENTOLIN HFA) 108 (90 Base) MCG/ACT inhaler Inhale 1-2 puffs into the lungs every  6 (six) hours as needed for wheezing or shortness of breath.   allopurinol (ZYLOPRIM) 100 MG tablet TAKE 2 TABLETS BY MOUTH EVERY DAY   atorvastatin (LIPITOR) 40 MG tablet TAKE 1 TABLET BY MOUTH EVERY DAY   benazepril (LOTENSIN) 10 MG tablet TAKE 1 TABLET (10 MG TOTAL) BY MOUTH DAILY. TAKE WITH ADDITIONAL 5MG TABLET OF BENAZEPRIL DAILY   benazepril (LOTENSIN) 5 MG tablet TAKE 1 TABLET BY MOUTH DAILY WITH 10MG OF BENAZEPRIL TO EQUAL 15MG DAILY   blood glucose meter kit and supplies KIT Dispense based on patient and insurance preference. Use up to four times daily as directed. (FOR ICD-9 250.00, 250.01).   calcium carbonate (TUMS - DOSED IN MG ELEMENTAL CALCIUM) 500 MG chewable tablet Chew 1 tablet by mouth daily as needed for indigestion or heartburn.   Continuous Blood Gluc Receiver (FREESTYLE LIBRE 2 READER) DEVI Use as directed   Continuous Blood Gluc Sensor (FREESTYLE LIBRE 14 DAY SENSOR) MISC USE AS DIRECTED   cyanocobalamin 1000  MCG tablet Take 2,000 mcg by mouth daily.   diltiazem (CARDIZEM CD) 180 MG 24 hr capsule Take 1 capsule (180 mg total) by mouth daily.   dofetilide (TIKOSYN) 500 MCG capsule TAKE 1 CAPSULE BY MOUTH 2 TIMES DAILY.   ELIQUIS 5 MG TABS tablet TAKE 1 TABLET BY MOUTH TWICE A DAY   ferrous sulfate 325 (65 FE) MG tablet Take 1 tablet (325 mg total) by mouth 2 (two) times daily with a meal.   fluticasone (FLONASE) 50 MCG/ACT nasal spray Place 1 spray into both nostrils daily. (Patient taking differently: Place 1 spray into both nostrils daily as needed for allergies.)   folic acid (FOLVITE) 1 MG tablet TAKE 1 TABLET BY MOUTH EVERY DAY   furosemide (LASIX) 40 MG tablet TAKE 1 TABLET BY MOUTH EVERY DAY AS NEEDED   gabapentin (NEURONTIN) 100 MG capsule TAKE 1 CAPSULE BY MOUTH AT BEDTIME.   glucose blood (ACCU-CHEK GUIDE) test strip USE AS DIRECTED UP TO 4 TIMES DAILY   levothyroxine (SYNTHROID) 112 MCG tablet TAKE 1 TABLET BY MOUTH EVERY DAY BEFORE BREAKFAST   methocarbamol  (ROBAXIN) 500 MG tablet Take 500 mg by mouth every 8 (eight) hours as needed.   metoprolol succinate (TOPROL-XL) 25 MG 24 hr tablet Take 1 tablet (25 mg total) by mouth daily.   Multiple Vitamins-Minerals (MULTIVITAMIN GUMMIES MENS PO) Take 2 tablets by mouth daily with breakfast.    nitroGLYCERIN (NITROSTAT) 0.4 MG SL tablet Place 1 tablet (0.4 mg total) under the tongue every 5 (five) minutes as needed for chest pain. Up to 3 doses   OXYGEN Inhale 2 L into the lungs at bedtime.   pantoprazole (PROTONIX) 40 MG tablet TAKE 1 TABLET BY MOUTH EVERY DAY BEFORE BREAKFAST   sennosides-docusate sodium (SENOKOT-S) 8.6-50 MG tablet Take 1 tablet by mouth as needed for constipation.   traMADol (ULTRAM) 50 MG tablet TAKE 1 TABLET BY MOUTH TWICE A DAY   traMADol (ULTRAM-ER) 200 MG 24 hr tablet TAKE 1 TABLET BY MOUTH EVERY DAY     Allergies:   Patient has no known allergies.   Social History   Socioeconomic History   Marital status: Married    Spouse name: [name]   Number of children: Not on file   Years of education: 14   Highest education level: Not on file  Occupational History   Occupation: retired    Fish farm manager: OTHER    Comment: Mining engineer  Tobacco Use   Smoking status: Never   Smokeless tobacco: Never  Vaping Use   Vaping Use: Never used  Substance and Sexual Activity   Alcohol use: Not Currently   Drug use: No   Sexual activity: Not Currently  Other Topics Concern   Not on file  Social History Narrative   Pt lives with wife. Does have stairs, but patient doesn't use them. Pt has completed technical school   Social Determinants of Health   Financial Resource Strain: Not on file  Food Insecurity: Not on file  Transportation Needs: Not on file  Physical Activity: Not on file  Stress: Not on file  Social Connections: Not on file     Family History: The patient's family history includes Lung cancer in his father; Stroke in his mother. There is no history of Colon cancer,  Esophageal cancer, Stomach cancer, Pancreatic cancer, or Liver disease.  ROS:   Please see the history of present illness.     All other systems reviewed and are negative.  EKGs/Labs/Other Studies Reviewed:  The following studies were reviewed today:  Heart cath 2019 1. Left dominant circulation 2. Severe 3 vessel occlusive CAD.    - 95% ostial LAD    - 100% first diagonal    - 100% ostial LCx. This is new since 2017 - was 95% prior    - 100% nondominant RCA 3. Patent LIMA to the LAD. There is chronic severe disease in the apical LAD 4. Patent SVG to the first diagonal and SVG to the first OM as a Y graft. 5. Chronic occlusion of the SVG to terminal LCx 6. Normal LVEDP   Plan: continue medical management. Will resume IV heparin until coumadin is again therapeutic given history of CVA.   Diagnostic Dominance: Left  EKG:  EKG is  ordered today.  The ekg ordered today demonstrates sinus rhythm with HR 61, LVH, QRS 120 ms - stable from prior tracing  Recent Labs: 02/22/2021: Magnesium 2.2 11/03/2021: ALT 54; BUN 19; Creatinine, Ser 1.11; Hemoglobin 15.7; Platelets 183; Potassium 4.1; Sodium 139 01/21/2022: TSH 1.34  Recent Lipid Panel    Component Value Date/Time   CHOL 152 03/05/2021 1208   CHOL 138 12/05/2016 1100   TRIG 188.0 (H) 03/05/2021 1208   HDL 44.10 03/05/2021 1208   HDL 46 12/05/2016 1100   CHOLHDL 3 03/05/2021 1208   VLDL 37.6 03/05/2021 1208   LDLCALC 71 03/05/2021 1208   LDLCALC 67 12/05/2016 1100     Risk Assessment/Calculations:    CHA2DS2-VASc Score = 8   This indicates a 10.8% annual risk of stroke. The patient's score is based upon: CHF History: 1 HTN History: 1 Diabetes History: 1 Stroke History: 2 Vascular Disease History: 1 Age Score: 2 Gender Score: 0   Physical Exam:    VS:  BP 128/68   Pulse 65   Ht 6' 3" (1.905 m)   Wt 238 lb (108 kg)   SpO2 97%   BMI 29.75 kg/m     Wt Readings from Last 3 Encounters:  02/18/22 238 lb (108  kg)  02/12/22 235 lb 6.4 oz (106.8 kg)  01/21/22 237 lb (107.5 kg)     GEN: Well nourished, well developed, in no acute distress HEENT: normal Neck: no JVD, carotid bruits, or masses Cardiac: RRR; no murmurs, rubs, or gallops, minor edema  Respiratory:  clear to auscultation bilaterally, normal work of breathing GI: soft, nontender, nondistended, + BS MS: no deformity or atrophy Skin: warm and dry, no rash Neuro:  Alert and Oriented x 3, Strength and sensation are intact Psych: euthymic mood, full affect   ASSESSMENT:    1. Persistent atrial fibrillation ([place])   2. Coronary artery disease involving native coronary artery of native heart without angina pectoris   3. Hyperlipidemia, unspecified hyperlipidemia type     PLAN:    In order of problems listed above:  CAD s/p CABG 3/4 grafts patent in 2019, he has an occluded SVG-LCx Continue medical management    Hyperlipidemia with LDL goal less than 70 Lipid Panel     Component Value Date/Time   CHOL 152 03/05/2021 1208   CHOL 138 12/05/2016 1100   TRIG 188.0 (H) 03/05/2021 1208   HDL 44.10 03/05/2021 1208   HDL 46 12/05/2016 1100   CHOLHDL 3 03/05/2021 1208   VLDL 37.6 03/05/2021 1208   LDLCALC 71 03/05/2021 1208   LDLCALC 67 12/05/2016 1100   LABVLDL 25 12/05/2016 1100    Continue lipitor 40 mg   Chronic diastolic heart failure Hypertension Uses compression  stockings He takes lasix about 3 times per week for lower extremity swelling - trouble with swelling in left leg (SV harvest side). Stable   Persistent atrial fibrillation Maintained on dofetilide-Dr. Caryl Comes EKG  with sinus rhythm 180 mg cardizem Pleased   History of amiodarone lung toxicity - DLCO was as low as 55 9/18 but CT showed no evidence of interstitial lung disease, pulmonologist Dr. Lamonte Sakai. Stable   Chronic anticoagulation Continue Eliquis 5 mg twice daily. Easy bleeding in arm.    DM Not on insulin   CHA2DS2-VASc Score = 8  This  indicates a 10.8% annual risk of stroke. The patient's score is based upon: CHF History: 1 HTN History: 1 Diabetes History: 1 Stroke History: 2 Vascular Disease History: 1 Age Score: 2 Gender Score: 0   CrCl 55m/min using adjusted body weight Platelet count 148K   Follow up in 12 months.     Medication Adjustments/Labs and Tests Ordered: Current medicines are reviewed at length with the patient today.  Concerns regarding medicines are outlined above.  No orders of the defined types were placed in this encounter.  No orders of the defined types were placed in this encounter.   Patient Instructions  Medication Instructions:  Your physician recommends that you continue on your current medications as directed. Please refer to the Current Medication list given to you today.  *If you need a refill on your cardiac medications before your next appointment, please call your pharmacy*   Lab Work: None ordered.  If you have labs (blood work) drawn today and your tests are completely normal, you will receive your results only by: [place](if you have MyChart) OR A paper copy in the mail If you have any lab test that is abnormal or we need to change your treatment, we will call you to review the results.   Testing/Procedures: None ordered.    Follow-Up: [facility] and your health needs are our priority.  As part of our continuing mission to provide you with exceptional heart care, we have created designated Provider Care Teams.  These Care Teams include your primary Cardiologist (physician) and Advanced Practice Providers (APPs -  Physician Assistants and Nurse Practitioners) who all work together to provide you with the care you need, when you need it.  We recommend signing up for the patient portal called "MyChart".  Sign up information is provided on this After Visit Summary.  MyChart is used to connect with patients for Virtual Visits (Telemedicine).   Patients are able to view lab/test results, encounter notes, upcoming appointments, etc.  Non-urgent messages can be sent to your provider as well.   To learn more about what you can do with MyChart, go to [email or web address]    Your next appointment:   12 months with Dr SMarlou Porch Important Information About Sugar         Signed, MCandee Furbish MD  02/18/2022 10:14 AM    [place]

## 2022-02-21 ENCOUNTER — Other Ambulatory Visit: Payer: Self-pay | Admitting: Family

## 2022-02-21 MED ORDER — BENAZEPRIL HCL 5 MG PO TABS: ORAL_TABLET | ORAL | 1 refills | Status: DC

## 2022-03-08 ENCOUNTER — Other Ambulatory Visit: Payer: Self-pay | Admitting: Internal Medicine

## 2022-03-08 ENCOUNTER — Other Ambulatory Visit: Payer: Self-pay | Admitting: Family

## 2022-03-15 ENCOUNTER — Other Ambulatory Visit: Payer: Self-pay | Admitting: Student

## 2022-03-15 ENCOUNTER — Other Ambulatory Visit: Payer: Self-pay | Admitting: Physical Medicine & Rehabilitation

## 2022-03-15 ENCOUNTER — Other Ambulatory Visit: Payer: Self-pay | Admitting: Cardiology

## 2022-03-15 ENCOUNTER — Other Ambulatory Visit: Payer: Self-pay | Admitting: Family

## 2022-03-15 DIAGNOSIS — Z8639 Personal history of other endocrine, nutritional and metabolic disease: Secondary | ICD-10-CM

## 2022-03-15 DIAGNOSIS — I4811 Longstanding persistent atrial fibrillation: Secondary | ICD-10-CM

## 2022-04-01 ENCOUNTER — Encounter: Payer: Medicare Other | Attending: Physical Medicine & Rehabilitation | Admitting: Physical Medicine & Rehabilitation

## 2022-04-01 ENCOUNTER — Encounter: Payer: Self-pay | Admitting: Physical Medicine & Rehabilitation

## 2022-04-01 VITALS — BP 131/71 | HR 69 | Temp 98.0°F | Ht 75.0 in | Wt 240.0 lb

## 2022-04-01 DIAGNOSIS — G8112 Spastic hemiplegia affecting left dominant side: Secondary | ICD-10-CM | POA: Insufficient documentation

## 2022-04-01 MED ORDER — ONABOTULINUMTOXINA 100 UNITS IJ SOLR
300.0000 [IU] | Freq: Once | INTRAMUSCULAR | Status: AC
  Administered 2022-04-01: 300 [IU] via INTRAMUSCULAR

## 2022-04-01 NOTE — Patient Instructions (Signed)

## 2022-04-01 NOTE — Progress Notes (Signed)
Botox Injection for spasticity using needle EMG guidance  Dilution: 50 Units/ml Indication: Severe spasticity which interferes with ADL,mobility and/or  hygiene and is unresponsive to medication management and other conservative care Informed consent was obtained after describing risks and benefits of the procedure with the patient. This includes bleeding, bruising, infection, excessive weakness, or medication side effects. A REMS form is on file and signed. Needle: 27g 1" needle electrode Number of units per muscle LEFT UE Biceps50  LEFT LE  Medial Hamstrings250 All injections were done after obtaining appropriate EMG activity and after negative drawback for blood. The patient tolerated the procedure well. Post procedure instructions were given. A followup appointment was made.

## 2022-04-11 ENCOUNTER — Other Ambulatory Visit: Payer: Self-pay | Admitting: Family

## 2022-04-17 ENCOUNTER — Other Ambulatory Visit: Payer: Self-pay | Admitting: Family

## 2022-04-17 DIAGNOSIS — Z8639 Personal history of other endocrine, nutritional and metabolic disease: Secondary | ICD-10-CM

## 2022-04-17 MED ORDER — LISINOPRIL 5 MG PO TABS: 5.0000 mg | ORAL_TABLET | Freq: Every day | ORAL | 1 refills | Status: DC

## 2022-04-17 NOTE — Telephone Encounter (Signed)
Benazepril on back order,   All Pharmacy Suggested Alternatives:  perindopril (ACEON) 2 MG tablet lisinopril (ZESTRIL) 5 MG tablet enalapril (VASOTEC) 5 MG tablet quinapril (ACCUPRIL) 5 MG tablet ramipril (ALTACE) 1.25 MG capsule   Please advise?

## 2022-04-17 NOTE — Telephone Encounter (Signed)
Please advise pt that benazapril is on back order.  Rx sent for lisinopril instead.

## 2022-04-18 NOTE — Telephone Encounter (Signed)
Called home and cell numbers but no answer and no voice mail. Sent Mychart message

## 2022-05-13 ENCOUNTER — Encounter: Payer: Medicare Other | Attending: Physical Medicine & Rehabilitation | Admitting: Physical Medicine & Rehabilitation

## 2022-05-13 ENCOUNTER — Encounter: Payer: Self-pay | Admitting: Physical Medicine & Rehabilitation

## 2022-05-13 VITALS — BP 149/86 | HR 58 | Ht 75.0 in | Wt 245.0 lb

## 2022-05-13 DIAGNOSIS — M48062 Spinal stenosis, lumbar region with neurogenic claudication: Secondary | ICD-10-CM

## 2022-05-13 DIAGNOSIS — G8112 Spastic hemiplegia affecting left dominant side: Secondary | ICD-10-CM | POA: Diagnosis not present

## 2022-05-13 NOTE — Progress Notes (Signed)
Subjective:    Patient ID: [name], male    DOB: 06-30-1945, 77 y.o.   MRN: [id number]  HPI  CC:  LLE pain and Right hand pain 77 yo male with hx of Afib , right CVA  with chronic left spastic hemiparesis which has not responded to PT or oral meds.  Good relief with botox. Also has hx of lumbar stenosis with last MRI in 2017 showing severe central stenosis at L3-4, has multilevel facet arthropathy that could potentially progress to cause foraminal stenosis  6wk post Botox which has relieved stiffness in Left knee and elbow, no post injection bruising  LEFT UE Biceps50  LEFT LE  Medial Hamstrings250 Pain Inventory Average Pain 7 Pain Right Now 5 My pain is intermittent and tingling  In the last 24 hours, has pain interfered with the following? General activity 6 Relation with others 0 Enjoyment of life 5 What TIME of day is your pain at its worst? daytime Sleep (in general) Fair  Pain is worse with: standing Pain improves with: medication Relief from Meds: 9  Family History  Problem Relation Age of Onset   Lung cancer Father        deceased   Stroke Mother        deceased-MINISTROKES   Colon cancer Neg Hx    Esophageal cancer Neg Hx    Stomach cancer Neg Hx    Pancreatic cancer Neg Hx    Liver disease Neg Hx    Social History   Socioeconomic History   Marital status: Married    Spouse name: [name]   Number of children: Not on file   Years of education: 14   Highest education level: Not on file  Occupational History   Occupation: retired    Fish farm manager: OTHER    Comment: Mining engineer  Tobacco Use   Smoking status: Never   Smokeless tobacco: Never  Vaping Use   Vaping Use: Never used  Substance and Sexual Activity   Alcohol use: Not Currently   Drug use: No   Sexual activity: Not Currently  Other Topics Concern   Not on file  Social History Narrative   Pt lives with wife. Does have stairs, but patient doesn't use them. Pt has completed  technical school   Social Determinants of Health   Financial Resource Strain: Not on file  Food Insecurity: Not on file  Transportation Needs: Not on file  Physical Activity: Not on file  Stress: Not on file  Social Connections: Not on file   Past Surgical History:  Procedure Laterality Date   CARDIAC CATHETERIZATION N/A 10/25/2015   Procedure: Left Heart Cath and Cors/Grafts Angiography;  Surgeon: Troy Sine, MD;  Location: [place] [facility];  Service: Cardiovascular;  Laterality: N/A;   CARDIOVERSION  06/20/2011   Procedure: CARDIOVERSION;  Surgeon: Larey Dresser, MD;  Location: [place];  Service: Cardiovascular;  Laterality: N/A;   CARDIOVERSION N/A 04/26/2015   Procedure: CARDIOVERSION;  Surgeon: Dorothy Spark, MD;  Location: [facility];  Service: Cardiovascular;  Laterality: N/A;   CARDIOVERSION N/A 05/10/2015   Procedure: CARDIOVERSION;  Surgeon: Larey Dresser, MD;  Location: [place];  Service: Cardiovascular;  Laterality: N/A;   CARDIOVERSION N/A 12/10/2018   Procedure: CARDIOVERSION;  Surgeon: Sanda Klein, MD;  Location: [place];  Service: Cardiovascular;  Laterality: N/A;   CATARACT EXTRACTION  05/2010   left eye   CATARACT EXTRACTION  04/2010   right eye   COLONOSCOPY WITH PROPOFOL  N/A 02/22/2020   Procedure: COLONOSCOPY WITH PROPOFOL;  Surgeon: Lavena Bullion, DO;  Location: [facility];  Service: Gastroenterology;  Laterality: N/A;   CORONARY ARTERY BYPASS GRAFT     stent   ESOPHAGOGASTRODUODENOSCOPY  03-25-2005   HEMORRHOID SURGERY N/A 09/05/2021   Procedure: HEMORRHOIDECTOMY WITH LIGATION AND HEMORRHOIDOPEXY;  Surgeon: Michael Boston, MD;  Location: [facility];  Service: General;  Laterality: N/A;   LEFT HEART CATH AND CORS/GRAFTS ANGIOGRAPHY N/A 02/19/2018   Procedure: LEFT HEART CATH AND CORS/GRAFTS ANGIOGRAPHY;  Surgeon: Martinique, Peter M, MD;  Location: [place] [facility];  Service: Cardiovascular;  Laterality: N/A;   NASAL SEPTUM SURGERY      PERCUTANEOUS PLACEMENT INTRAVASCULAR STENT CERVICAL CAROTID ARTERY     03-2009; using a drug-eluting platform of the circumflex cornoray artery with a 3.0 x 18 [employer] Promus drug-eluting platform post dilated to 3.75 with a noncompliant balloon.   POLYPECTOMY  02/22/2020   Procedure: POLYPECTOMY;  Surgeon: Lavena Bullion, DO;  Location: [facility];  Service: Gastroenterology;;   RECTAL EXAM UNDER ANESTHESIA N/A 09/05/2021   Procedure: ANORECTAL EXAM UNDER ANESTHESIA;  Surgeon: Michael Boston, MD;  Location: [facility];  Service: General;  Laterality: N/A;   TEE WITHOUT CARDIOVERSION  06/20/2011   Procedure: TRANSESOPHAGEAL ECHOCARDIOGRAM (TEE);  Surgeon: Larey Dresser, MD;  Location: [place];  Service: Cardiovascular;  Laterality: N/A;   Past Surgical History:  Procedure Laterality Date   CARDIAC CATHETERIZATION N/A 10/25/2015   Procedure: Left Heart Cath and Cors/Grafts Angiography;  Surgeon: Troy Sine, MD;  Location: [place] [facility];  Service: Cardiovascular;  Laterality: N/A;   CARDIOVERSION  06/20/2011   Procedure: CARDIOVERSION;  Surgeon: Larey Dresser, MD;  Location: [place];  Service: Cardiovascular;  Laterality: N/A;   CARDIOVERSION N/A 04/26/2015   Procedure: CARDIOVERSION;  Surgeon: Dorothy Spark, MD;  Location: [facility];  Service: Cardiovascular;  Laterality: N/A;   CARDIOVERSION N/A 05/10/2015   Procedure: CARDIOVERSION;  Surgeon: Larey Dresser, MD;  Location: [place];  Service: Cardiovascular;  Laterality: N/A;   CARDIOVERSION N/A 12/10/2018   Procedure: CARDIOVERSION;  Surgeon: Sanda Klein, MD;  Location: [place];  Service: Cardiovascular;  Laterality: N/A;   CATARACT EXTRACTION  05/2010   left eye   CATARACT EXTRACTION  04/2010   right eye   COLONOSCOPY WITH PROPOFOL N/A 02/22/2020   Procedure: COLONOSCOPY WITH PROPOFOL;  Surgeon: Lavena Bullion, DO;  Location: [facility];  Service: Gastroenterology;  Laterality: N/A;    CORONARY ARTERY BYPASS GRAFT     stent   ESOPHAGOGASTRODUODENOSCOPY  03-25-2005   HEMORRHOID SURGERY N/A 09/05/2021   Procedure: HEMORRHOIDECTOMY WITH LIGATION AND HEMORRHOIDOPEXY;  Surgeon: Michael Boston, MD;  Location: [facility];  Service: General;  Laterality: N/A;   LEFT HEART CATH AND CORS/GRAFTS ANGIOGRAPHY N/A 02/19/2018   Procedure: LEFT HEART CATH AND CORS/GRAFTS ANGIOGRAPHY;  Surgeon: Martinique, Peter M, MD;  Location: [place] [facility];  Service: Cardiovascular;  Laterality: N/A;   NASAL SEPTUM SURGERY     PERCUTANEOUS PLACEMENT INTRAVASCULAR STENT CERVICAL CAROTID ARTERY     03-2009; using a drug-eluting platform of the circumflex cornoray artery with a 3.0 x 18 [employer] Promus drug-eluting platform post dilated to 3.75 with a noncompliant balloon.   POLYPECTOMY  02/22/2020   Procedure: POLYPECTOMY;  Surgeon: Lavena Bullion, DO;  Location: [facility];  Service: Gastroenterology;;   RECTAL EXAM UNDER ANESTHESIA N/A 09/05/2021   Procedure: ANORECTAL EXAM UNDER ANESTHESIA;  Surgeon: Michael Boston, MD;  Location: [facility];  Service: General;  Laterality: N/A;   TEE WITHOUT CARDIOVERSION  06/20/2011   Procedure: TRANSESOPHAGEAL ECHOCARDIOGRAM (TEE);  Surgeon: Larey Dresser, MD;  Location: [facility];  Service: Cardiovascular;  Laterality: N/A;   Past Medical History:  Diagnosis Date   Acute encephalopathy 12/12/2015   Acute respiratory failure with hypoxia and hypercapnia ([place]) 12/12/2015   Allergic rhinitis 02/13/2010   Qualifier: Diagnosis of  By: Wynona Luna    Angina at rest 10/22/2015   Arthritis    Ataxia 08/10/2013   Atrial fibrillation ([place]) 11/2018   B12 deficiency 08/11/2013   BACK PAIN, LUMBAR 01/29/2009   Qualifier: Diagnosis of  By: Wynona Luna    BENIGN POSITIONAL VERTIGO 11/23/2009   Qualifier: Diagnosis of  By: Wynona Luna    Chronic diastolic heart failure ([place]) 04/24/2015   Chronic pain    left sided-[place]   Coronary artery disease  of native heart with stable angina pectoris ([place]) 11/04/2015   s/p CABG 2004; CFX DES 2011   Essential hypertension 12/12/2006   Qualifier: Diagnosis of  By: [name] RMA, [place]     GERD 12/12/2006   Qualifier: Diagnosis of  By: Reatha Armour, [place]     GOUT 12/12/2006   Qualifier: Diagnosis of  By: [name] RMA, [place]     Hemorrhoids    Hyperlipidemia LDL goal <70 12/12/2006   Qualifier: Diagnosis of  By: [name] RMA, [place]      Hypogonadism male 04/10/2011   Hypothyroidism, acquired 02/15/2013   Insomnia 05/21/2013   Long term current use of anticoagulant therapy 06/24/2011   Myocardial infarction ([place])    Obstructive sleep apnea-Failed CPAP 06/17/2007   Failed CPAP Using O 2 for sleep    Paroxysmal atrial fibrillation ([place]) 12/06/2016   Stroke (cerebrum) ([place]) 01/13/2017   also 1996; resultant hemiplegia of dominant side   Thoracic aorta atherosclerosis ([place]) 04/27/2017   Tubular adenoma of colon 05/2010   Type 2 diabetes mellitus without complication, without long-term current use of insulin ([place]) 10/05/2007   Qualifier: Diagnosis of  By: Wynona Luna    Vasovagal syncope    Vasovagal syncope    Wears glasses    There were no vitals taken for this visit.  Opioid Risk Score:   Fall Risk Score:  `1  Depression screen PHQ 2/9     11/01/2021    1:24 PM 04/04/2021    9:20 AM 02/15/2021    9:58 AM 06/26/2020    3:10 PM 05/29/2020   11:16 AM 05/17/2020   11:57 AM 04/19/2020    9:53 AM  Depression screen PHQ 2/9  Decreased Interest  0 0 0 0 0 0  Down, Depressed, Hopeless 0  0 0 0 0   PHQ - 2 Score 0 0 0 0 0 0 0  Altered sleeping 0        PHQ-9 Score 0            Review of Systems  Musculoskeletal:  Positive for gait problem.       Left arm and hand pain   All other systems reviewed and are negative.     Objective:   Physical Exam Vitals and nursing note reviewed.  Constitutional:      Appearance: He is obese.  HENT:     Head: Normocephalic and atraumatic.  Eyes:      Extraocular Movements: Extraocular movements intact.     Conjunctiva/sclera: Conjunctivae normal.  Pupils: Pupils are equal, round, and reactive to light.  Musculoskeletal:     Right lower leg: No edema.     Left lower leg: Edema present.     Comments: 1+ pitting LLE, no erythema  Skin:    General: Skin is warm and dry.  Neurological:     Mental Status: He is alert and oriented to person, place, and time.     Comments: TOne MAS 2 in Left biceps MAS 1 in finger flexors MAS 1 in knee flexors  MMT 4- /5 in left delt bi tri grip, HF, KE ADF  Psychiatric:        Mood and Affect: Mood normal.        Behavior: Behavior normal.    - SLR LLE No evidence of knee effsuion or pain with knee ROM       Assessment & Plan:   Lumbar stenosis Neurogenic Claudication, intermittent with walking, Lumbar MRI shows severe stenosis in 2017 at L3-4 which has likely progressed and may have foraminal narrowing.  Discussed treatment options including meds (already on hi dose tramadol), PT as well as ESI.  He does not wish to pursue these options but will try alternating heat and cold   2.  Left spatic hemi due to remote CVA, repeat Botox , same dose and muscle groups in 6wks

## 2022-05-13 NOTE — Patient Instructions (Signed)
If you wish to have the epidural for sciatic pain in left leg, please call

## 2022-05-14 ENCOUNTER — Encounter (INDEPENDENT_AMBULATORY_CARE_PROVIDER_SITE_OTHER): Payer: Medicare Other | Admitting: Ophthalmology

## 2022-05-14 DIAGNOSIS — H43813 Vitreous degeneration, bilateral: Secondary | ICD-10-CM | POA: Diagnosis not present

## 2022-05-14 DIAGNOSIS — E119 Type 2 diabetes mellitus without complications: Secondary | ICD-10-CM | POA: Diagnosis not present

## 2022-05-14 DIAGNOSIS — H35371 Puckering of macula, right eye: Secondary | ICD-10-CM | POA: Diagnosis not present

## 2022-05-23 ENCOUNTER — Ambulatory Visit: Payer: Medicare Other | Admitting: Family

## 2022-05-25 ENCOUNTER — Other Ambulatory Visit: Payer: Self-pay | Admitting: Student

## 2022-05-27 ENCOUNTER — Encounter: Payer: Self-pay | Admitting: Family

## 2022-05-27 ENCOUNTER — Ambulatory Visit (INDEPENDENT_AMBULATORY_CARE_PROVIDER_SITE_OTHER): Payer: Medicare Other | Admitting: Family

## 2022-05-27 VITALS — BP 134/69 | HR 54 | Temp 97.9°F | Resp 16 | Wt 245.0 lb

## 2022-05-27 DIAGNOSIS — E7800 Pure hypercholesterolemia, unspecified: Secondary | ICD-10-CM

## 2022-05-27 DIAGNOSIS — E119 Type 2 diabetes mellitus without complications: Secondary | ICD-10-CM | POA: Diagnosis not present

## 2022-05-27 DIAGNOSIS — K219 Gastro-esophageal reflux disease without esophagitis: Secondary | ICD-10-CM | POA: Diagnosis not present

## 2022-05-27 DIAGNOSIS — Z8639 Personal history of other endocrine, nutritional and metabolic disease: Secondary | ICD-10-CM

## 2022-05-27 DIAGNOSIS — D509 Iron deficiency anemia, unspecified: Secondary | ICD-10-CM

## 2022-05-27 DIAGNOSIS — J309 Allergic rhinitis, unspecified: Secondary | ICD-10-CM

## 2022-05-27 DIAGNOSIS — I4891 Unspecified atrial fibrillation: Secondary | ICD-10-CM

## 2022-05-27 DIAGNOSIS — I5032 Chronic diastolic (congestive) heart failure: Secondary | ICD-10-CM | POA: Diagnosis not present

## 2022-05-27 DIAGNOSIS — M79644 Pain in right finger(s): Secondary | ICD-10-CM | POA: Diagnosis not present

## 2022-05-27 DIAGNOSIS — E538 Deficiency of other specified B group vitamins: Secondary | ICD-10-CM | POA: Diagnosis not present

## 2022-05-27 DIAGNOSIS — I10 Essential (primary) hypertension: Secondary | ICD-10-CM | POA: Diagnosis not present

## 2022-05-27 DIAGNOSIS — E039 Hypothyroidism, unspecified: Secondary | ICD-10-CM | POA: Diagnosis not present

## 2022-05-27 LAB — MICROALBUMIN / CREATININE URINE RATIO
Creatinine,U: 142.2 mg/dL
Microalb Creat Ratio: 0.5 mg/g (ref 0.0–30.0)
Microalb, Ur: 0.7 mg/dL (ref 0.0–1.9)

## 2022-05-27 LAB — COMPREHENSIVE METABOLIC PANEL
ALT: 27 U/L (ref 0–53)
AST: 30 U/L (ref 0–37)
Albumin: 4 g/dL (ref 3.5–5.2)
Alkaline Phosphatase: 59 U/L (ref 39–117)
BUN: 23 mg/dL (ref 6–23)
CO2: 31 mEq/L (ref 19–32)
Calcium: 9.8 mg/dL (ref 8.4–10.5)
Chloride: 102 mEq/L (ref 96–112)
Creatinine, Ser: 1.03 mg/dL (ref 0.40–1.50)
GFR: 69.32 mL/min (ref >60.00)
Glucose, Bld: 107 mg/dL — ABNORMAL HIGH (ref 70–99)
Potassium: 4.2 mEq/L (ref 3.5–5.1)
Sodium: 141 mEq/L (ref 135–145)
Total Bilirubin: 0.7 mg/dL (ref 0.2–1.2)
Total Protein: 6.5 g/dL (ref 6.0–8.3)

## 2022-05-27 LAB — CBC WITH DIFFERENTIAL/PLATELET
Basophils Absolute: 0.1 10*3/uL (ref 0.0–0.1)
Basophils Relative: 1.3 % (ref 0.0–3.0)
Eosinophils Absolute: 0.3 10*3/uL (ref 0.0–0.7)
Eosinophils Relative: 6.2 % — ABNORMAL HIGH (ref 0.0–5.0)
HCT: 39.9 % (ref 39.0–52.0)
Hemoglobin: 13.3 g/dL (ref 13.0–17.0)
Lymphocytes Relative: 32 % (ref 12.0–46.0)
Lymphs Abs: 1.8 10*3/uL (ref 0.7–4.0)
MCHC: 33.4 g/dL (ref 30.0–36.0)
MCV: 93.2 fl (ref 78.0–100.0)
Monocytes Absolute: 0.5 10*3/uL (ref 0.1–1.0)
Monocytes Relative: 8.7 % (ref 3.0–12.0)
Neutro Abs: 2.9 10*3/uL (ref 1.4–7.7)
Neutrophils Relative %: 51.8 % (ref 43.0–77.0)
Platelets: 153 10*3/uL (ref 150.0–400.0)
RBC: 4.29 Mil/uL (ref 4.22–5.81)
RDW: 13.7 % (ref 11.5–15.5)
WBC: 5.5 10*3/uL (ref 4.0–10.5)

## 2022-05-27 LAB — LIPID PANEL
Cholesterol: 156 mg/dL (ref 0–200)
HDL: 43.2 mg/dL (ref >39.00)
LDL Cholesterol: 75 mg/dL (ref 0–99)
NonHDL: 112.35
Total CHOL/HDL Ratio: 4
Triglycerides: 186 mg/dL — ABNORMAL HIGH (ref 0.0–149.0)
VLDL: 37.2 mg/dL (ref 0.0–40.0)

## 2022-05-27 LAB — TSH: TSH: 2.49 u[IU]/mL (ref 0.35–5.50)

## 2022-05-27 LAB — HEMOGLOBIN A1C: Hgb A1c MFr Bld: 6.1 % (ref 4.6–6.5)

## 2022-05-27 NOTE — Assessment & Plan Note (Signed)
Uncontrolled.  Using mucinex with some improvement. Recommended claritin prn.

## 2022-05-27 NOTE — Assessment & Plan Note (Signed)
Lab Results  Component Value Date   HGBA1C 6.0 01/21/2022   HGBA1C 5.9 (H) 08/28/2021   HGBA1C 5.6 01/18/2021   Lab Results  Component Value Date   MICROALBUR <0.7 09/26/2014   LDLCALC 71 03/05/2021   CREATININE 1.11 11/03/2021

## 2022-05-27 NOTE — Assessment & Plan Note (Signed)
>>  ASSESSMENT AND PLAN FOR ATRIAL FIBRILLATION (HCC) WRITTEN ON 05/27/2022  8:58 AM BY O'SULLIVAN, Marlos Carmen, NP  Maintained on eliquis  and diltiazem /tikosyn .  Rate stable. Management per Dr. Fernande, EP.

## 2022-05-27 NOTE — Progress Notes (Signed)
Subjective:   By signing my name below, I, Madelin Rear, attest that this documentation has been prepared under the direction and in the presence of Debbrah Alar, NP. 05/27/2022.   Patient ID: [name], male    DOB: [birth year], 77 y.o.   MRN: [id number]  Chief Complaint  Patient presents with   Diabetes    Here for follow up   Hypothyroidism   Hypertension    HPI Patient is in today for an office visit.  Refills:  He reports difficulty with obtaining diltiazem and Tikosyn from his pharmacy.  Right wrist/hand:  He complains of swelling and pain in his right wrist and hand. At night he notices that the veins in his right hand appear swollen. Previously he has seen a hand surgeon regarding carpal tunnel, and was receiving steroidal treatment which helped for a time. He would like another referral.  Right eye:  He states that "the center of his eye is opening". He is seeing his ophthalmologist who is reportedly planning for surgery.  Seasonal allergies: He also is suffering from congestion secondary to allergies. He is taking Mucinex which seems to help.  Edema: He reports that his LE swelling is minimal today. He will take Lasix when he needs it.  History of Afib:  Compliant with Eliquis and Tikosyn for atrial fibrillation. He denies any recurring arrhythmic episodes in the past 1.5 years.   Blood Pressure:  Today his blood pressure is stable in clinic. BP Readings from Last 3 Encounters:  05/27/22 134/69  05/13/22 (!) 149/86  04/01/22 131/71   Supplements:  Current supplements he takes includes a B12 vitamin, and iron supplement (2 gummies once daily).  GI:  He endorses normal bowel movements lately.   Sleep:  He states that he is not sleeping well, which he believes is related to acid reflux. This is usually resolved if he sleeps propped up on two pillows. He would prefer to not start any medications to help him sleep.   Past Medical History:  Diagnosis Date    Acute encephalopathy 12/12/2015   Acute respiratory failure with hypoxia and hypercapnia (HCC) 12/12/2015   Allergic rhinitis 02/13/2010   Qualifier: Diagnosis of  By: Wynona Luna    Angina at rest 10/22/2015   Arthritis    Ataxia 08/10/2013   Atrial fibrillation ([place]) 11/2018   B12 deficiency 08/11/2013   BACK PAIN, LUMBAR 01/29/2009   Qualifier: Diagnosis of  By: Wynona Luna    BENIGN POSITIONAL VERTIGO 11/23/2009   Qualifier: Diagnosis of  By: Wynona Luna    Chronic diastolic heart failure ([place]) 04/24/2015   Chronic pain    left sided-[place]   Coronary 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 ([place]) 11/04/2015   s/p CABG 2004; CFX DES 2011   Elevated troponin    Essential hypertension 12/12/2006   Qualifier: Diagnosis of  By: [name] RMA, [place]     GERD 12/12/2006   Qualifier: Diagnosis of  By: Reatha Armour, [place]     GOUT 12/12/2006   Qualifier: Diagnosis of  By: [name] RMA, Lucy     Hemorrhoids    Hyperlipidemia LDL goal <70 12/12/2006   Qualifier: Diagnosis of  By: [name] RMA, [place]      Hypogonadism male 04/10/2011   Hypothyroidism, acquired 02/15/2013   Insomnia 05/21/2013   Long term current use of anticoagulant therapy 06/24/2011   Myocardial infarction ([place])    Obstructive sleep apnea-Failed CPAP 06/17/2007  Failed CPAP Using O 2 for sleep    Paroxysmal atrial fibrillation ([place]) 12/06/2016   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   also 1996; resultant hemiplegia of dominant side   Thoracic aorta atherosclerosis ([place]) 04/27/2017   Tubular adenoma of colon 05/2010   Type 2 diabetes mellitus without complication, without long-term current use of insulin ([place]) 10/05/2007   Qualifier: Diagnosis of  By: Wynona Luna    Vasovagal syncope    Vasovagal syncope    Wears glasses     Past Surgical History:  Procedure Laterality Date   CARDIAC CATHETERIZATION N/A 10/25/2015   Procedure: Left Heart Cath and Cors/Grafts Angiography;   Surgeon: Troy Sine, MD;  Location: [facility];  Service: Cardiovascular;  Laterality: N/A;   CARDIOVERSION  06/20/2011   Procedure: CARDIOVERSION;  Surgeon: Larey Dresser, MD;  Location: [place];  Service: Cardiovascular;  Laterality: N/A;   CARDIOVERSION N/A 04/26/2015   Procedure: CARDIOVERSION;  Surgeon: Dorothy Spark, MD;  Location: [facility]OSCOPY;  Service: Cardiovascular;  Laterality: N/A;   CARDIOVERSION N/A 05/10/2015   Procedure: CARDIOVERSION;  Surgeon: Larey Dresser, MD;  Location: [facility];  Service: Cardiovascular;  Laterality: N/A;   CARDIOVERSION N/A 12/10/2018   Procedure: CARDIOVERSION;  Surgeon: Sanda Klein, MD;  Location: [place];  Service: Cardiovascular;  Laterality: N/A;   CATARACT EXTRACTION  05/2010   left eye   CATARACT EXTRACTION  04/2010   right eye   COLONOSCOPY WITH PROPOFOL N/A 02/22/2020   Procedure: COLONOSCOPY WITH PROPOFOL;  Surgeon: Lavena Bullion, DO;  Location: [facility] ENDOSCOPY;  Service: Gastroenterology;  Laterality: N/A;   CORONARY ARTERY BYPASS GRAFT     stent   ESOPHAGOGASTRODUODENOSCOPY  03-25-2005   HEMORRHOID SURGERY N/A 09/05/2021   Procedure: HEMORRHOIDECTOMY WITH LIGATION AND HEMORRHOIDOPEXY;  Surgeon: Michael Boston, MD;  Location: [facility];  Service: General;  Laterality: N/A;   LEFT HEART CATH AND CORS/GRAFTS ANGIOGRAPHY N/A 02/19/2018   Procedure: LEFT HEART CATH AND CORS/GRAFTS ANGIOGRAPHY;  Surgeon: Martinique, Peter M, MD;  Location: [facility];  Service: Cardiovascular;  Laterality: N/A;   NASAL SEPTUM SURGERY     PERCUTANEOUS PLACEMENT INTRAVASCULAR STENT CERVICAL CAROTID ARTERY     03-2009; using a drug-eluting platform of the circumflex cornoray artery with a 3.0 x 18 [employer] Promus drug-eluting platform post dilated to 3.75 with a noncompliant balloon.   POLYPECTOMY  02/22/2020   Procedure: POLYPECTOMY;  Surgeon: Lavena Bullion, DO;  Location: [facility];  Service: Gastroenterology;;   RECTAL  EXAM UNDER ANESTHESIA N/A 09/05/2021   Procedure: ANORECTAL EXAM UNDER ANESTHESIA;  Surgeon: Michael Boston, MD;  Location: [facility];  Service: General;  Laterality: N/A;   TEE WITHOUT CARDIOVERSION  06/20/2011   Procedure: TRANSESOPHAGEAL ECHOCARDIOGRAM (TEE);  Surgeon: Larey Dresser, MD;  Location: [facility] END[facility];  Service: Cardiovascular;  Laterality: N/A;    Family History  Problem Relation Age of Onset   Lung cancer Father        deceased   Stroke Mother        deceased-MINISTROKES   Colon cancer Neg Hx    Esophageal cancer Neg Hx    Stomach cancer Neg Hx    Pancreatic cancer Neg Hx    Liver disease Neg Hx     Social History   Socioeconomic History   Marital status: Married    Spouse name: [name]   Number of children: Not on file   Years of education: 14   Highest education  level: Not on file  Occupational History   Occupation: retired    Fish farm manager: OTHER    Comment: Mining engineer  Tobacco Use   Smoking status: Never   Smokeless tobacco: Never  Vaping Use   Vaping Use: Never used  Substance and Sexual Activity   Alcohol use: Not Currently   Drug use: No   Sexual activity: Not Currently  Other Topics Concern   Not on file  Social History Narrative   Pt lives with wife. Does have stairs, but patient doesn't use them. Pt has completed technical school   Social Determinants of Health   Financial Resource Strain: Not on file  Food Insecurity: Not on file  Transportation Needs: Not on file  Physical Activity: Not on file  Stress: Not on file  Social Connections: Not on file  Intimate Partner Violence: Not on file    Outpatient Medications Prior to Visit  Medication Sig Dispense Refill   albuterol (VENTOLIN HFA) 108 (90 Base) MCG/ACT inhaler Inhale 1-2 puffs into the lungs every 6 (six) hours as needed for wheezing or shortness of breath.     allopurinol (ZYLOPRIM) 100 MG tablet TAKE 2 TABLETS BY MOUTH EVERY DAY 180 tablet 1   atorvastatin (LIPITOR) 40 MG tablet  TAKE 1 TABLET BY MOUTH EVERY DAY 90 tablet 1   blood glucose meter kit and supplies KIT Dispense based on patient and insurance preference. Use up to four times daily as directed. (FOR ICD-9 250.00, 250.01). 102 each 0   calcium carbonate (TUMS - DOSED IN MG ELEMENTAL CALCIUM) 500 MG chewable tablet Chew 1 tablet by mouth daily as needed for indigestion or heartburn.     Continuous Blood Gluc Receiver (FREESTYLE LIBRE 2 READER) DEVI Use as directed 1 each 0   Continuous Blood Gluc Sensor (FREESTYLE LIBRE 14 DAY SENSOR) MISC USE AS DIRECTED 1 each 5   cyanocobalamin 1000 MCG tablet Take 2,000 mcg by mouth daily.     diltiazem (CARDIZEM CD) 180 MG 24 hr capsule TAKE 1 CAPSULE BY MOUTH EVERY DAY 90 capsule 3   dofetilide (TIKOSYN) 500 MCG capsule TAKE 1 CAPSULE BY MOUTH TWICE A DAY 60 capsule 0   ELIQUIS 5 MG TABS tablet TAKE 1 TABLET BY MOUTH TWICE A DAY 180 tablet 1   ferrous sulfate 325 (65 FE) MG tablet Take 1 tablet (325 mg total) by mouth 2 (two) times daily with a meal. 180 tablet 1   fluticasone (FLONASE) 50 MCG/ACT nasal spray Place 1 spray into both nostrils daily. (Patient taking differently: Place 1 spray into both nostrils daily as needed for allergies.) 16 g 0   folic acid (FOLVITE) 1 MG tablet TAKE 1 TABLET BY MOUTH EVERY DAY 90 tablet 1   furosemide (LASIX) 40 MG tablet TAKE 1 TABLET BY MOUTH EVERY DAY AS NEEDED 90 tablet 1   gabapentin (NEURONTIN) 100 MG capsule TAKE 1 CAPSULE BY MOUTH EVERYDAY AT BEDTIME 90 capsule 1   glucose blood (ACCU-CHEK GUIDE) test strip USE AS DIRECTED UP TO 4 TIMES DAILY 100 strip 0   levothyroxine (SYNTHROID) 112 MCG tablet TAKE 1 TABLET BY MOUTH EVERY DAY BEFORE BREAKFAST 90 tablet 0   lisinopril (ZESTRIL) 5 MG tablet Take 1 tablet (5 mg total) by mouth daily. 90 tablet 1   methocarbamol (ROBAXIN) 500 MG tablet Take 500 mg by mouth every 8 (eight) hours as needed.     metoprolol succinate (TOPROL-XL) 25 MG 24 hr tablet TAKE 1 TABLET (25 MG TOTAL) BY MOUTH  DAILY. 90 tablet 3   Multiple Vitamins-Minerals (MULTIVITAMIN GUMMIES MENS PO) Take 2 tablets by mouth daily with breakfast.      nitroGLYCERIN (NITROSTAT) 0.4 MG SL tablet Place 1 tablet (0.4 mg total) under the tongue every 5 (five) minutes as needed for chest pain. Up to 3 doses 10 tablet 2   OXYGEN Inhale 2 L into the lungs at bedtime.     pantoprazole (PROTONIX) 40 MG tablet TAKE 1 TABLET BY MOUTH EVERY DAY BEFORE BREAKFAST 90 tablet 1   sennosides-docusate sodium (SENOKOT-S) 8.6-50 MG tablet Take 1 tablet by mouth as needed for constipation.     traMADol (ULTRAM) 50 MG tablet TAKE 1 TABLET BY MOUTH TWICE A DAY 180 tablet 1   traMADol (ULTRAM-ER) 200 MG 24 hr tablet TAKE 1 TABLET BY MOUTH EVERY DAY 90 tablet 1   No facility-administered medications prior to visit.    No Known Allergies  Review of Systems  HENT:  Positive for congestion.   Eyes:        Visual disturbance of right eye.  Cardiovascular:  Positive for leg swelling (Bilateral).  Musculoskeletal:  Positive for joint pain (Right wrist and hand).  Endo/Heme/Allergies:  Positive for environmental allergies.       Objective:    Physical Exam Constitutional:      General: He is not in acute distress.    Appearance: Normal appearance. He is not ill-appearing.  HENT:     Head: Normocephalic and atraumatic.     Right Ear: Tympanic membrane, ear canal and external ear normal.     Left Ear: Tympanic membrane, ear canal and external ear normal.  Eyes:     Extraocular Movements: Extraocular movements intact.     Pupils: Pupils are equal, round, and reactive to light.  Cardiovascular:     Rate and Rhythm: Normal rate and regular rhythm.     Heart sounds: Normal heart sounds. No murmur heard.    No gallop.  Pulmonary:     Effort: Pulmonary effort is normal. No respiratory distress.     Breath sounds: Normal breath sounds. No wheezing or rales.  Musculoskeletal:     Right lower leg: 1+ Edema present.     Left lower leg:  1+ Edema present.  Skin:    General: Skin is warm and dry.  Neurological:     General: No focal deficit present.     Mental Status: He is alert and oriented to person, place, and time.  Psychiatric:        Mood and Affect: Mood normal.        Behavior: Behavior normal.     BP 134/69 (BP Location: Right Arm, Patient Position: Sitting, Cuff Size: Large)   Pulse (!) 54   Temp 97.9 F (36.6 C) (Oral)   Resp 16   Wt 245 lb (111.1 kg)   SpO2 97%   BMI 30.62 kg/m  Wt Readings from Last 3 Encounters:  05/27/22 245 lb (111.1 kg)  05/13/22 245 lb (111.1 kg)  04/01/22 240 lb (108.9 kg)    Diabetic Foot Exam - Simple   Simple Foot Form Diabetic Foot exam was performed with the following findings: Yes 05/27/2022  9:09 AM  Visual Inspection No deformities, no ulcerations, no other skin breakdown bilaterally: Yes Sensation Testing See comments: Yes Pulse Check Posterior Tibialis and Dorsalis pulse intact bilaterally: Yes Comments + sensation to monofilament on right foot, very  little on left foot.    Lab Results  Component Value  Date   WBC 9.0 11/03/2021   HGB 15.7 11/03/2021   HCT 48.4 11/03/2021   PLT 183 11/03/2021   GLUCOSE 142 (H) 11/03/2021   CHOL 152 03/05/2021   TRIG 188.0 (H) 03/05/2021   HDL 44.10 03/05/2021   LDLCALC 71 03/05/2021   ALT 54 (H) 11/03/2021   AST 54 (H) 11/03/2021   NA 139 11/03/2021   K 4.1 11/03/2021   CL 106 11/03/2021   CREATININE 1.11 11/03/2021   BUN 19 11/03/2021   CO2 24 11/03/2021   TSH 1.34 01/21/2022   PSA 0.19 08/16/2014   INR 1.3 (H) 11/24/2020   HGBA1C 6.0 01/21/2022   MICROALBUR <0.7 09/26/2014    Lab Results  Component Value Date   TSH 1.34 01/21/2022   Lab Results  Component Value Date   WBC 9.0 11/03/2021   HGB 15.7 11/03/2021   HCT 48.4 11/03/2021   MCV 94.9 11/03/2021   PLT 183 11/03/2021   Lab Results  Component Value Date   NA 139 11/03/2021   K 4.1 11/03/2021   CO2 24 11/03/2021   GLUCOSE 142 (H)  11/03/2021   BUN 19 11/03/2021   CREATININE 1.11 11/03/2021   BILITOT 0.6 11/03/2021   ALKPHOS 61 11/03/2021   AST 54 (H) 11/03/2021   ALT 54 (H) 11/03/2021   PROT 7.5 11/03/2021   ALBUMIN 4.2 11/03/2021   CALCIUM 9.8 11/03/2021   ANIONGAP 9 11/03/2021   EGFR 62 02/22/2021   GFR 58.89 (L) 12/11/2020   Lab Results  Component Value Date   CHOL 152 03/05/2021   Lab Results  Component Value Date   HDL 44.10 03/05/2021   Lab Results  Component Value Date   LDLCALC 71 03/05/2021   Lab Results  Component Value Date   TRIG 188.0 (H) 03/05/2021   Lab Results  Component Value Date   CHOLHDL 3 03/05/2021   Lab Results  Component Value Date   HGBA1C 6.0 01/21/2022       Assessment & Plan:   Problem List Items Addressed This Visit       Unprioritized   Type 2 diabetes mellitus without complication, without long-term current use of insulin ([place])    Lab Results  Component Value Date   HGBA1C 6.0 01/21/2022   HGBA1C 5.9 (H) 08/28/2021   HGBA1C 5.6 01/18/2021   Lab Results  Component Value Date   MICROALBUR <0.7 09/26/2014   [place] 71 03/05/2021   CREATININE 1.11 11/03/2021        Pure hypercholesterolemia   Relevant Orders   Lipid panel   Iron deficiency anemia    He continues iron  supplement 2 tabs daily. Check cbc/iron studies.        Relevant Orders   CBC w/Diff   Iron, TIBC and Ferritin Panel   Hypothyroidism    Lab Results  Component Value Date   TSH 1.34 01/21/2022  Continues synthroid.       History of type 2 diabetes mellitus   Relevant Orders   HgB A1c   Urine Microalbumin w/creat. ratio   History of hypothyroidism   Relevant Orders   TSH   GERD    Fair control on pantoprazole.  Sleeps propped up on pillows.       Essential hypertension    BP Readings from Last 3 Encounters:  05/27/22 134/69  05/13/22 (!) 149/86  04/01/22 131/71  BP stable.  Continue toprol xl and lisinopril.       Relevant Orders   Comp Met (  CMET)    Chronic diastolic CHF (congestive heart failure) ([place])    Reports that he takes lasix prn.  Reports edema is stable, denies SOB.      B12 deficiency    Last b12 level looked good on oral supplement. Continue same.       Atrial fibrillation ([place])    Maintained on eliquis and diltiazem/tikosyn.  Rate stable. Management per Dr. Caryl Comes, EP.       Allergic rhinitis    Uncontrolled.  Using mucinex with some improvement. Recommended claritin prn.       Other Visit Diagnoses     Thumb pain, right    -  Primary   Relevant Orders   Ambulatory referral to Hand Surgery        No orders of the defined types were placed in this encounter.   I, [name], NP, personally preformed the services described in this documentation.  All medical record entries made by the scribe were at my direction and in my presence.  I have reviewed the chart and discharge instructions (if applicable) and agree that the record reflects my personal performance and is accurate and complete. 05/27/2022.  I,Mathew Stumpf,acting as a Education administrator for [employer], NP.,have documented all relevant documentation on the behalf of [name], NP,as directed by  [name], NP while in the presence of [name], NP.   [name], NP

## 2022-05-27 NOTE — Assessment & Plan Note (Signed)
Maintained on eliquis and diltiazem/tikosyn.  Rate stable. Management per Dr. Caryl Comes, EP.

## 2022-05-27 NOTE — Assessment & Plan Note (Signed)
He continues iron  supplement 2 tabs daily. Check cbc/iron studies.

## 2022-05-27 NOTE — Assessment & Plan Note (Signed)
Last b12 level looked good on oral supplement. Continue same.

## 2022-05-27 NOTE — Assessment & Plan Note (Addendum)
BP Readings from Last 3 Encounters:  05/27/22 134/69  05/13/22 (!) 149/86  04/01/22 131/71   BP stable.  Continue toprol xl and lisinopril.

## 2022-05-27 NOTE — Patient Instructions (Addendum)
Please schedule a follow up with Dr.  Caryl Comes: [phone]

## 2022-05-27 NOTE — Assessment & Plan Note (Signed)
Fair control on pantoprazole.  Sleeps propped up on pillows.

## 2022-05-27 NOTE — Assessment & Plan Note (Addendum)
Lab Results  Component Value Date   TSH 1.34 01/21/2022   Continues synthroid.

## 2022-05-27 NOTE — Assessment & Plan Note (Signed)
Lab Results  Component Value Date   CHOL 152 03/05/2021   HDL 44.10 03/05/2021   LDLCALC 71 03/05/2021   TRIG 188.0 (H) 03/05/2021   CHOLHDL 3 03/05/2021   Continue crestor- due for follow up.

## 2022-05-27 NOTE — Assessment & Plan Note (Signed)
Reports that he takes lasix prn.  Reports edema is stable, denies SOB.

## 2022-05-28 ENCOUNTER — Telehealth: Payer: Self-pay | Admitting: Internal Medicine

## 2022-05-28 ENCOUNTER — Encounter: Payer: Self-pay | Admitting: Cardiology

## 2022-05-28 LAB — IRON,TIBC AND FERRITIN PANEL
%SAT: 40 % (calc) (ref 20–48)
Ferritin: 325 ng/mL (ref 24–380)
Iron: 121 ug/dL (ref 50–180)
TIBC: 302 mcg/dL (calc) (ref 250–425)

## 2022-05-28 MED ORDER — DOFETILIDE 500 MCG PO CAPS: 500.0000 ug | ORAL_CAPSULE | Freq: Two times a day (BID) | ORAL | 0 refills | Status: DC

## 2022-05-28 MED ORDER — DILTIAZEM HCL ER COATED BEADS 180 MG PO CP24: 180.0000 mg | ORAL_CAPSULE | Freq: Every day | ORAL | 0 refills | Status: DC

## 2022-05-28 NOTE — Telephone Encounter (Signed)
*  STAT* If patient is at the pharmacy, call can be transferred to refill team.   1. Which medications need to be refilled? (please list name of each medication and dose if known)  dofetilide (TIKOSYN) 500 MCG capsule  diltiazem (CARDIZEM CD) 180 MG 24 hr capsule   2. Which pharmacy/location (including street and city if local pharmacy) is medication to be sent to?  [id number] - [place], [place] - [address]    3. Do they need a 30 day or 90 day supply? 90 day   Patient has an appointment 06/05/2022

## 2022-05-28 NOTE — Telephone Encounter (Signed)
error 

## 2022-05-28 NOTE — Telephone Encounter (Signed)
Pt's medications were sent to pt's pharmacy as requested. Confirmation received.  

## 2022-06-04 NOTE — Progress Notes (Unsigned)
  Electrophysiology Office Note:   Date:  06/05/2022  ID:  [name], DOB 11-10-1945, MRN [id number]  Primary Cardiologist: Candee Furbish, MD Electrophysiologist: Virl Axe, MD   History of Present Illness:   [name] is a 77 y.o. male seen today for routine electrophysiology followup. Since last being seen in our clinic the patient reports doing well overall. He takes prn lasix ~twice weekly for peripheral edema.  he denies chest pain, palpitations, dyspnea, PND, orthopnea, nausea, vomiting, dizziness, syncope, edema, weight gain, or early satiety.   Review of systems complete and found to be negative unless listed in HPI.   Studies Reviewed:    EKG is ordered today. Personal review shows NSR at 72 bpm.   Risk Assessment/Calculations:    CHA2DS2-VASc Score = 8   Physical Exam:   VS:  BP (!) 140/78   Pulse 72   Ht 6\' 3"  (1.905 m)   Wt 243 lb (110.2 kg)   SpO2 97%   BMI 30.37 kg/m    Wt Readings from Last 3 Encounters:  06/05/22 243 lb (110.2 kg)  05/27/22 245 lb (111.1 kg)  05/13/22 245 lb (111.1 kg)     GEN: Well nourished, well developed in no acute distress NECK: No JVD; No carotid bruits CARDIAC: Regular rate and rhythm, no murmurs, rubs, gallops RESPIRATORY:  Clear to auscultation without rales, wheezing or rhonchi  ABDOMEN: Soft, non-tender, non-distended EXTREMITIES:  No edema; No deformity   ASSESSMENT AND PLAN:    Persistent AFib Continue Eliquis, appropriately dosed EKG today shows NSR with stable QT  Labs today.  Continue tikosyn 500 mcg BID Re-inforced Tikosyn education and importance of regular follow up   CAD No s/s ischemia Continue beta blocker and statin   Chronic CHF (diastolic) Volume status mildly elevated On BB, ACE, diuretic tx Continue prn lasix. He will take a dose tomorrow.    HTN Stable on current regimen   Follow up with Afib Clinic in 6 months  Signed, Shirley Friar, PA-C

## 2022-06-05 ENCOUNTER — Encounter: Payer: Self-pay | Admitting: Physical Medicine & Rehabilitation

## 2022-06-05 ENCOUNTER — Encounter: Payer: Self-pay | Admitting: Student

## 2022-06-05 ENCOUNTER — Ambulatory Visit: Payer: Medicare Other | Attending: Student | Admitting: Student

## 2022-06-05 ENCOUNTER — Telehealth: Payer: Self-pay | Admitting: Physical Medicine & Rehabilitation

## 2022-06-05 ENCOUNTER — Telehealth: Payer: Self-pay | Admitting: *Deleted

## 2022-06-05 ENCOUNTER — Encounter: Payer: Medicare Other | Attending: Physical Medicine & Rehabilitation | Admitting: Physical Medicine & Rehabilitation

## 2022-06-05 ENCOUNTER — Telehealth: Payer: Self-pay

## 2022-06-05 VITALS — BP 140/78 | HR 72 | Ht 75.0 in | Wt 243.0 lb

## 2022-06-05 DIAGNOSIS — I5032 Chronic diastolic (congestive) heart failure: Secondary | ICD-10-CM | POA: Insufficient documentation

## 2022-06-05 DIAGNOSIS — G8112 Spastic hemiplegia affecting left dominant side: Secondary | ICD-10-CM | POA: Diagnosis not present

## 2022-06-05 DIAGNOSIS — M5417 Radiculopathy, lumbosacral region: Secondary | ICD-10-CM | POA: Insufficient documentation

## 2022-06-05 DIAGNOSIS — I2510 Atherosclerotic heart disease of native coronary artery without angina pectoris: Secondary | ICD-10-CM | POA: Insufficient documentation

## 2022-06-05 DIAGNOSIS — I10 Essential (primary) hypertension: Secondary | ICD-10-CM | POA: Insufficient documentation

## 2022-06-05 DIAGNOSIS — I4819 Other persistent atrial fibrillation: Secondary | ICD-10-CM | POA: Diagnosis not present

## 2022-06-05 NOTE — Telephone Encounter (Signed)
   Patient Name: [name]  DOB: [birth year] MRN: [id number]  Primary Cardiologist: Candee Furbish, MD  Chart reviewed as part of pre-operative protocol coverage. Pre-op clearance already addressed by colleagues in earlier phone notes. To summarize recommendations:  -Would rather hold Eliquis x 3 days before the procedure per protocol and resume when medically safe to do so. -Dr. Marlou Porch  Will route this bundled recommendation to requesting provider via Epic fax function and remove from pre-op pool. Please call with questions.  Elgie Collard, PA-C 06/05/2022, 1:57 PM

## 2022-06-05 NOTE — Telephone Encounter (Signed)
Error

## 2022-06-05 NOTE — Telephone Encounter (Signed)
Patient with diagnosis of afib on Eliquis for anticoagulation.    Procedure:  EPIDURAL SPINAL INJECTION  Date of procedure: 06/19/22   CHA2DS2-VASc Score = 8   This indicates a 10.8% annual risk of stroke. The patient's score is based upon: CHF History: 1 HTN History: 1 Diabetes History: 1 Stroke History: 2 Vascular Disease History: 1 Age Score: 2 Gender Score: 0      CrCl 77.9 ml/min  Patient is at high risk off anticoagulation due to hx of stroke and high CHA2DS2VASc score. Since it is a spinal procedure, would need to hold 3 days. MD is asking for 7 days which seems excessive. Will have to defer to Dr. Marlou Porch of Dr. Caryl Comes.  **This guidance is not considered finalized until pre-operative APP has relayed final recommendations.**

## 2022-06-05 NOTE — Progress Notes (Signed)
Subjective:    Patient ID: [name], male    DOB: [birth year], 77 y.o.   MRN: [id number]  HPI 77 yo male with hx of Afib on Eliquis , chronic left spastic hemiparesis requiring Botox injections for management who is complaining of increasing RIGHT lower extremity pain over the last 3wks, was having primarily  LLE and low back pain prior to botox injection No recent falls or trauma, no fevers or hospitalizations since last visit.  No new bowel or bladder dysfunction. Pt states that he has pain that goes into the RIght foot   Most recent Lumbar MRI was in 2017   EXAM: MRI LUMBAR SPINE WITHOUT CONTRAST   TECHNIQUE: Multiplanar, multisequence MR imaging of the lumbar spine was performed. No intravenous contrast was administered.   COMPARISON:  Plain films lumbar spine 12/05/2015.   FINDINGS: Segmentation:  Unremarkable.   Alignment:  Maintained with exaggeration of lumbar lordosis noted.   Vertebrae: Mild, remote anterior superior endplate compression fracture of L1 is seen. Scattered Schmorl's nodes are noted. Marrow signal is unremarkable.   Conus medullaris: Extends to the L1-2 level and appears normal.   Paraspinal and other soft tissues: Unremarkable.   Disc levels:   T12-L1: Shallow disc bulge and endplate spur to the left without central canal or foraminal stenosis.   L1-2: Mild to moderate facet degenerative change and a minimal disc bulge without central canal or foraminal stenosis.   L2-3: Moderate facet arthropathy, shallow disc bulge and ligamentum flavum thickening are seen. There is moderate central canal stenosis. The foramina are open.   L3-4: Advanced bilateral facet degenerative change and a shallow disc bulge are seen. There is moderate to moderately severe central canal stenosis and narrowing of the subarticular recesses, greater on the right. The foramina are open.   L4-5: Advanced bilateral facet degenerative change and a shallow disc  bulge are identified. Mild central canal narrowing is present with narrowing of the subarticular recesses but no nerve root compression. The foramina are open.   L5-S1: Advanced bilateral facet degenerative disease. Minimal disc bulge without central canal or foraminal stenosis.   IMPRESSION: Multilevel spondylosis. Central canal narrowing appears worst at L3-4 where there is moderate to moderately severe central canal stenosis with narrowing of both lateral recesses. No nerve root compression is identified. Multilevel facet arthropathy is also identified. Please see above for descriptions of individual levels.   Mild anterior and remote superior endplate compression fracture of L1.     Electronically Signed   By: Inge Rise M.D.   On: 12/08/2015 13:49   Pain Inventory Average Pain 7 Pain Right Now 6 My pain is constant and Steady pain   In the last 24 hours, has pain interfered with the following? General activity 7 Relation with others 0 Enjoyment of life 0 What TIME of day is your pain at its worst? morning , daytime, evening, and night Sleep (in general) Fair  Pain is worse with: walking, bending, standing, and some activites Pain improves with: medication Relief from Meds: 5  Family History  Problem Relation Age of Onset   Lung cancer Father        deceased   Stroke Mother        deceased-MINISTROKES   Colon cancer Neg Hx    Esophageal cancer Neg Hx    Stomach cancer Neg Hx    Pancreatic cancer Neg Hx    Liver disease Neg Hx    Social History   Socioeconomic History  Marital status: Married    Spouse name: [name]   Number of children: Not on file   Years of education: 14   Highest education level: Not on file  Occupational History   Occupation: retired    Fish farm manager: OTHER    Comment: Mining engineer  Tobacco Use   Smoking status: Never   Smokeless tobacco: Never  Vaping Use   Vaping Use: Never used  Substance and Sexual Activity    Alcohol use: Not Currently   Drug use: No   Sexual activity: Not Currently  Other Topics Concern   Not on file  Social History Narrative   Pt lives with wife. Does have stairs, but patient doesn't use them. Pt has completed technical school   Social Determinants of Health   Financial Resource Strain: Not on file  Food Insecurity: Not on file  Transportation Needs: Not on file  Physical Activity: Not on file  Stress: Not on file  Social Connections: Not on file   Past Surgical History:  Procedure Laterality Date   CARDIAC CATHETERIZATION N/A 10/25/2015   Procedure: Left Heart Cath and Cors/Grafts Angiography;  Surgeon: Troy Sine, MD;  Location: [place] [facility];  Service: Cardiovascular;  Laterality: N/A;   CARDIOVERSION  06/20/2011   Procedure: CARDIOVERSION;  Surgeon: Larey Dresser, MD;  Location: [place];  Service: Cardiovascular;  Laterality: N/A;   CARDIOVERSION N/A 04/26/2015   Procedure: CARDIOVERSION;  Surgeon: Dorothy Spark, MD;  Location: [facility];  Service: Cardiovascular;  Laterality: N/A;   CARDIOVERSION N/A 05/10/2015   Procedure: CARDIOVERSION;  Surgeon: Larey Dresser, MD;  Location: [place];  Service: Cardiovascular;  Laterality: N/A;   CARDIOVERSION N/A 12/10/2018   Procedure: CARDIOVERSION;  Surgeon: Sanda Klein, MD;  Location: [place];  Service: Cardiovascular;  Laterality: N/A;   CATARACT EXTRACTION  05/2010   left eye   CATARACT EXTRACTION  04/2010   right eye   COLONOSCOPY WITH PROPOFOL N/A 02/22/2020   Procedure: COLONOSCOPY WITH PROPOFOL;  Surgeon: Lavena Bullion, DO;  Location: [facility];  Service: Gastroenterology;  Laterality: N/A;   CORONARY ARTERY BYPASS GRAFT     stent   ESOPHAGOGASTRODUODENOSCOPY  03-25-2005   HEMORRHOID SURGERY N/A 09/05/2021   Procedure: HEMORRHOIDECTOMY WITH LIGATION AND HEMORRHOIDOPEXY;  Surgeon: Michael Boston, MD;  Location: [facility];  Service: General;  Laterality: N/A;   LEFT HEART CATH AND  CORS/GRAFTS ANGIOGRAPHY N/A 02/19/2018   Procedure: LEFT HEART CATH AND CORS/GRAFTS ANGIOGRAPHY;  Surgeon: Martinique, Peter M, MD;  Location: [place] [facility];  Service: Cardiovascular;  Laterality: N/A;   NASAL SEPTUM SURGERY     PERCUTANEOUS PLACEMENT INTRAVASCULAR STENT CERVICAL CAROTID ARTERY     03-2009; using a drug-eluting platform of the circumflex cornoray artery with a 3.0 x 18 [employer] Promus drug-eluting platform post dilated to 3.75 with a noncompliant balloon.   POLYPECTOMY  02/22/2020   Procedure: POLYPECTOMY;  Surgeon: Lavena Bullion, DO;  [facility];  Service: Gastroenterology;;   RECTAL EXAM UNDER ANESTHESIA N/A 09/05/2021   Procedure: ANORECTAL EXAM UNDER ANESTHESIA;  Surgeon: Michael Boston, MD;  Location: [facility];  Service: General;  Laterality: N/A;   TEE WITHOUT CARDIOVERSION  06/20/2011   Procedure: TRANSESOPHAGEAL ECHOCARDIOGRAM (TEE);  Surgeon: Larey Dresser, MD;  Location: [place];  Service: Cardiovascular;  Laterality: N/A;   Past Surgical History:  Procedure Laterality Date   CARDIAC CATHETERIZATION N/A 10/25/2015   Procedure: Left Heart Cath and Cors/Grafts Angiography;  Surgeon: Troy Sine,  MD;  Location: [place] [facility];  Service: Cardiovascular;  Laterality: N/A;   CARDIOVERSION  06/20/2011   Procedure: CARDIOVERSION;  Surgeon: Larey Dresser, MD;  Location: [place];  Service: Cardiovascular;  Laterality: N/A;   CARDIOVERSION N/A 04/26/2015   Procedure: CARDIOVERSION;  Surgeon: Dorothy Spark, MD;  Location: [facility] ENDOSCOPY;  Service: Cardiovascular;  Laterality: N/A;   CARDIOVERSION N/A 05/10/2015   Procedure: CARDIOVERSION;  Surgeon: Larey Dresser, MD;  Location: [place];  Service: Cardiovascular;  Laterality: N/A;   CARDIOVERSION N/A 12/10/2018   Procedure: CARDIOVERSION;  Surgeon: Sanda Klein, MD;  Location: [place];  Service: Cardiovascular;  Laterality: N/A;   CATARACT EXTRACTION  05/2010   left eye    CATARACT EXTRACTION  04/2010   right eye   COLONOSCOPY WITH PROPOFOL N/A 02/22/2020   Procedure: COLONOSCOPY WITH PROPOFOL;  Surgeon: Lavena Bullion, DO;  [facility];  Service: Gastroenterology;  Laterality: N/A;   CORONARY ARTERY BYPASS GRAFT     stent   ESOPHAGOGASTRODUODENOSCOPY  03-25-2005   HEMORRHOID SURGERY N/A 09/05/2021   Procedure: HEMORRHOIDECTOMY WITH LIGATION AND HEMORRHOIDOPEXY;  Surgeon: Michael Boston, MD;  [facility];  Service: General;  Laterality: N/A;   LEFT HEART CATH AND CORS/GRAFTS ANGIOGRAPHY N/A 02/19/2018   Procedure: LEFT HEART CATH AND CORS/GRAFTS ANGIOGRAPHY;  Surgeon: Martinique, Peter M, MD;  Location: [place] [facility];  Service: Cardiovascular;  Laterality: N/A;   NASAL SEPTUM SURGERY     PERCUTANEOUS PLACEMENT INTRAVASCULAR STENT CERVICAL CAROTID ARTERY     03-2009; using a drug-eluting platform of the circumflex cornoray artery with a 3.0 x 18 [employer] Promus drug-eluting platform post dilated to 3.75 with a noncompliant balloon.   POLYPECTOMY  02/22/2020   Procedure: POLYPECTOMY;  Surgeon: Lavena Bullion, DO;  [facility];  Service: Gastroenterology;;   RECTAL EXAM UNDER ANESTHESIA N/A 09/05/2021   Procedure: ANORECTAL EXAM UNDER ANESTHESIA;  Surgeon: Michael Boston, MD;  [facility];  Service: General;  Laterality: N/A;   TEE WITHOUT CARDIOVERSION  06/20/2011   Procedure: TRANSESOPHAGEAL ECHOCARDIOGRAM (TEE);  Surgeon: Larey Dresser, MD;  Location: [facility] ENDOSCOPY;  Service: Cardiovascular;  Laterality: N/A;   Past Medical History:  Diagnosis Date   Acute encephalopathy 12/12/2015   Acute respiratory failure with hypoxia and hypercapnia ([place]) 12/12/2015   Allergic rhinitis 02/13/2010   Qualifier: Diagnosis of  By: Wynona Luna    Angina at rest 10/22/2015   Arthritis    Ataxia 08/10/2013   Atrial fibrillation ([place]) 11/2018   B12 deficiency 08/11/2013   BACK PAIN, LUMBAR 01/29/2009   Qualifier: Diagnosis of   By: Wynona Luna    BENIGN POSITIONAL VERTIGO 11/23/2009   Qualifier: Diagnosis of  By: Wynona Luna    Chronic diastolic heart failure ([place]) 04/24/2015   Chronic pain    left sided-[place]   Coronary 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 ([place]) 11/04/2015   s/p CABG 2004; CFX DES 2011   Elevated troponin    Essential hypertension 12/12/2006   Qualifier: Diagnosis of  By: [name] RMA, [place]     GERD 12/12/2006   Qualifier: Diagnosis of  By: Reatha Armour, [place]     GOUT 12/12/2006   Qualifier: Diagnosis of  By: [name] RMA, Lucy     Hemorrhoids    Hyperlipidemia LDL goal <70 12/12/2006   Qualifier: Diagnosis of  By: Reatha Armour, [place]      Hypogonadism male 04/10/2011  Hypothyroidism, acquired 02/15/2013   Insomnia 05/21/2013   Long term current use of anticoagulant therapy 06/24/2011   Myocardial infarction [facility])    Obstructive sleep apnea-Failed CPAP 06/17/2007   Failed CPAP Using O 2 for sleep    Paroxysmal atrial fibrillation ([place]) 12/06/2016   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   also 1996; resultant hemiplegia of dominant side   Thoracic aorta atherosclerosis ([place]) 04/27/2017   Tubular adenoma of colon 05/2010   Type 2 diabetes mellitus without complication, without long-term current use of insulin ([place]) 10/05/2007   Qualifier: Diagnosis of  By: Wynona Luna    Vasovagal syncope    Vasovagal syncope    Wears glasses    Ht 6\' 3"  (1.905 m)   BMI 30.37 kg/m   Opioid Risk Score:   Fall Risk Score:  `1  Depression screen [facility] 2/9     05/13/2022    9:31 AM 11/01/2021    1:24 PM 04/04/2021    9:20 AM 02/15/2021    9:58 AM 06/26/2020    3:10 PM 05/29/2020   11:16 AM 05/17/2020   11:57 AM  Depression screen PHQ 2/9  Decreased Interest 0  0 0 0 0 0  Down, Depressed, Hopeless 0 0  0 0 0 0  PHQ - 2 Score 0 0 0 0 0 0 0  Altered sleeping  0       PHQ-9 Score  0           Review of Systems  Musculoskeletal:  Positive for gait problem.       Left  Shoulder pain  Right hip pain   All other systems reviewed and are negative.     Objective:   Physical Exam  Gen NAD  MMT RLE 5/5 in HF, KE, ADF,APF  LLE 4/5 in HF, KE, ADF, APF  Tone  RLE normal  LLE MAS 2 in hamstrings, MAS 3 in ankle PF  Neg SLR bilaterally  Back has no pain to palpation  Lumbar ROM is reduce to 50% with flexion 25% with extension , fwd flexed posture    Assessment & Plan:    RLE pain appears radicular , ongoing for 3 wks, ability to perform usual back exercises limited due to hx of stroke.   LLE discomfort likely tone related and improved after Botox injection will repeat same dose and muscle groups in ~6wks Review of last MRI lumbar spine demonstrates Right lateral recess stenosis at L3-4 but symptoms suggest L4-5 , will schedule for RIght L4-5 Transforaminal ESI Hold Eliquis 3 d pre ESI and resume day after ESI, contacted [name] from cardiology who agrees with plan.

## 2022-06-05 NOTE — Patient Instructions (Signed)
Back Exercises These exercises help to make your trunk and back strong. They also help to keep the lower back flexible. Doing these exercises can help to prevent or lessen pain in your lower back. If you have back pain, try to do these exercises 2-3 times each day or as told by your doctor. As you get better, do the exercises once each day. Repeat the exercises more often as told by your doctor. To stop back pain from coming back, do the exercises once each day, or as told by your doctor. Do exercises exactly as told by your doctor. Stop right away if you feel sudden pain or your pain gets worse. Exercises Single knee to chest Do these steps 3-5 times in a row for each leg: Lie on your back on a firm bed or the floor with your legs stretched out. Bring one knee to your chest. Grab your knee or thigh with both hands and hold it in place. Pull on your knee until you feel a gentle stretch in your lower back or butt. Keep doing the stretch for 10-30 seconds. Slowly let go of your leg and straighten it. Pelvic tilt Do these steps 5-10 times in a row: Lie on your back on a firm bed or the floor with your legs stretched out. Bend your knees so they point up to the ceiling. Your feet should be flat on the floor. Tighten your lower belly (abdomen) muscles to press your lower back against the floor. This will make your tailbone point up to the ceiling instead of pointing down to your feet or the floor. Stay in this position for 5-10 seconds while you gently tighten your muscles and breathe evenly. Cat-cow Do these steps until your lower back bends more easily: Get on your hands and knees on a firm bed or the floor. Keep your hands under your shoulders, and keep your knees under your hips. You may put padding under your knees. Let your head hang down toward your chest. Tighten (contract) the muscles in your belly. Point your tailbone toward the floor so your lower back becomes rounded like the back of a  cat. Stay in this position for 5 seconds. Slowly lift your head. Let the muscles of your belly relax. Point your tailbone up toward the ceiling so your back forms a sagging arch like the back of a cow. Stay in this position for 5 seconds.  Press-ups Do these steps 5-10 times in a row: Lie on your belly (face-down) on a firm bed or the floor. Place your hands near your head, about shoulder-width apart. While you keep your back relaxed and keep your hips on the floor, slowly straighten your arms to raise the top half of your body and lift your shoulders. Do not use your back muscles. You may change where you place your hands to make yourself more comfortable. Stay in this position for 5 seconds. Keep your back relaxed. Slowly return to lying flat on the floor.  Bridges Do these steps 10 times in a row: Lie on your back on a firm bed or the floor. Bend your knees so they point up to the ceiling. Your feet should be flat on the floor. Your arms should be flat at your sides, next to your body. Tighten your butt muscles and lift your butt off the floor until your waist is almost as high as your knees. If you do not feel the muscles working in your butt and the back of   your thighs, slide your feet 1-2 inches (2.5-5 cm) farther away from your butt. Stay in this position for 3-5 seconds. Slowly lower your butt to the floor, and let your butt muscles relax. If this exercise is too easy, try doing it with your arms crossed over your chest. Belly crunches Do these steps 5-10 times in a row: Lie on your back on a firm bed or the floor with your legs stretched out. Bend your knees so they point up to the ceiling. Your feet should be flat on the floor. Cross your arms over your chest. Tip your chin a little bit toward your chest, but do not bend your neck. Tighten your belly muscles and slowly raise your chest just enough to lift your shoulder blades a tiny bit off the floor. Avoid raising your body  higher than that because it can put too much stress on your lower back. Slowly lower your chest and your head to the floor. Back lifts Do these steps 5-10 times in a row: Lie on your belly (face-down) with your arms at your sides, and rest your forehead on the floor. Tighten the muscles in your legs and your butt. Slowly lift your chest off the floor while you keep your hips on the floor. Keep the back of your head in line with the curve in your back. Look at the floor while you do this. Stay in this position for 3-5 seconds. Slowly lower your chest and your face to the floor. Contact a doctor if: Your back pain gets a lot worse when you do an exercise. Your back pain does not get better within 2 hours after you exercise. If you have any of these problems, stop doing the exercises. Do not do them again unless your doctor says it is okay. Get help right away if: You have sudden, very bad back pain. If this happens, stop doing the exercises. Do not do them again unless your doctor says it is okay. This information is not intended to replace advice given to you by your health care provider. Make sure you discuss any questions you have with your health care provider. Document Revised: 05/16/2020 Document Reviewed: 05/16/2020 Elsevier Patient Education  2023 Elsevier Inc.  

## 2022-06-05 NOTE — Patient Instructions (Signed)
Medication Instructions:  Your physician recommends that you continue on your current medications as directed. Please refer to the Current Medication list given to you today.  *If you need a refill on your cardiac medications before your next appointment, please call your pharmacy*  Lab Work: Mag today If you have labs (blood work) drawn today and your tests are completely normal, you will receive your results only by: [place] (if you have MyChart) OR A paper copy in the mail If you have any lab test that is abnormal or we need to change your treatment, we will call you to review the results.   Follow-Up: At [facility], you and your health needs are our priority.  As part of our continuing mission to provide you with exceptional heart care, we have created designated Provider Care Teams.  These Care Teams include your primary Cardiologist (physician) and Advanced Practice Providers (APPs -  Physician Assistants and Nurse Practitioners) who all work together to provide you with the care you need, when you need it.   Your next appointment:   1 year(s)  Provider:   Virl Axe, MD   You will follow up in the [facility] located at [facility] in 6 months Your provider will be: Roderic Palau, NP or Clint R. Fenton, PA-C

## 2022-06-05 NOTE — Telephone Encounter (Signed)
Medical clearance from Cardiology. Hold Elquis 3 days per protocol.

## 2022-06-05 NOTE — Telephone Encounter (Signed)
       Pre-operative Risk Assessment    Patient Name: [name]  DOB: [birth year] MRN: [id number]      Request for Surgical Clearance    Procedure:   EPIDURAL SPINAL INJECTION  Date of Surgery:  Clearance 06/19/22                                 Surgeon:  Sherlyn Lick Surgeon's Group or Practice Name:  [facility] [facility] Phone number:  [phone] Fax number:  [phone]   Type of Clearance Requested:   - Pharmacy:  Hold Apixaban (Eliquis) X'S 7 DAYS   Type of Anesthesia:  Not Indicated   Additional requests/questions:    Astrid Divine   06/05/2022, 10:56 AM

## 2022-06-06 LAB — MAGNESIUM: Magnesium: 2.3 mg/dL (ref 1.6–2.3)

## 2022-06-10 ENCOUNTER — Ambulatory Visit: Payer: Medicare Other | Admitting: Physical Medicine & Rehabilitation

## 2022-06-19 ENCOUNTER — Encounter: Payer: Medicare Other | Admitting: Physical Medicine & Rehabilitation

## 2022-06-24 ENCOUNTER — Other Ambulatory Visit: Payer: Self-pay | Admitting: Orthopedic Surgery

## 2022-06-24 DIAGNOSIS — M19031 Primary osteoarthritis, right wrist: Secondary | ICD-10-CM | POA: Diagnosis not present

## 2022-06-24 DIAGNOSIS — M79644 Pain in right finger(s): Secondary | ICD-10-CM | POA: Diagnosis not present

## 2022-06-26 ENCOUNTER — Telehealth: Payer: Self-pay | Admitting: Family

## 2022-06-26 ENCOUNTER — Encounter: Payer: Self-pay | Admitting: Physical Medicine & Rehabilitation

## 2022-06-26 ENCOUNTER — Encounter: Payer: Medicare Other | Attending: Physical Medicine & Rehabilitation | Admitting: Physical Medicine & Rehabilitation

## 2022-06-26 ENCOUNTER — Telehealth: Payer: Self-pay | Admitting: Cardiology

## 2022-06-26 VITALS — Ht 75.0 in | Wt 243.0 lb

## 2022-06-26 DIAGNOSIS — M48062 Spinal stenosis, lumbar region with neurogenic claudication: Secondary | ICD-10-CM | POA: Diagnosis not present

## 2022-06-26 DIAGNOSIS — G8110 Spastic hemiplegia affecting unspecified side: Secondary | ICD-10-CM | POA: Diagnosis not present

## 2022-06-26 DIAGNOSIS — I69352 Hemiplegia and hemiparesis following cerebral infarction affecting left dominant side: Secondary | ICD-10-CM | POA: Insufficient documentation

## 2022-06-26 MED ORDER — LIDOCAINE HCL (PF) 1 % IJ SOLN: 2.0000 mL | Freq: Once | INTRAMUSCULAR | Status: DC

## 2022-06-26 MED ORDER — LIDOCAINE HCL 1 % IJ SOLN: 5.0000 mL | Freq: Once | INTRAMUSCULAR | Status: DC

## 2022-06-26 MED ORDER — DEXAMETHASONE SODIUM PHOSPHATE 10 MG/ML IJ SOLN: 10.0000 mg | Freq: Once | INTRAMUSCULAR | Status: DC

## 2022-06-26 MED ORDER — IOHEXOL 180 MG/ML  SOLN: 3.0000 mL | Freq: Once | INTRAMUSCULAR | Status: DC

## 2022-06-26 NOTE — Progress Notes (Signed)
Left L4-5 Lumbar transforaminal epidural steroid injection under fluoroscopic guidance with contrast enhancement  Indication: Bilateral Lumbosacral radiculitis  was worse on Right last month now worse on left , MRI reviewed Bilateral stenosis multilevel , L4 is most symptomatic I(not relieved by medication management tramadol , not able to take NSAID or other conservative care and interfering with self-care and mobility). Pt indicates he has been off Eliquis x 6 d (was told to hold x 3 d)   Informed consent was obtained after describing risk and benefits of the procedure with the patient, this includes bleeding, bruising, infection, paralysis and medication side effects.  The patient wishes to proceed and has given written consent.  Patient was placed in prone position.  The lumbar area was marked and prepped with Betadine.  It was entered with a 25-gauge 1-1/2 inch needle and one mL of 1% lidocaine was injected into the skin and subcutaneous tissue.  Then a 22-gauge 5in spinal needle was inserted into the Left L4-5  intervertebral foramen under AP, lateral, and oblique view.  Once needle tip was within the foramen on lateral views an dnor exceeding 6 o clock position on th epedical on AP viewed Isovue 200 was inected x 7ml Then a solution containing one mL of 10 mg per mL dexamethasone and 2 mL of 1% lidocaine was injected.  The patient tolerated procedure well.  Post procedure instructions were given.  Please see post procedure form.

## 2022-06-26 NOTE — Progress Notes (Signed)
  PROCEDURE RECORD [name]   Name: [name] DOB:28-Nov-1945 MRN: [id number]  Date:06/26/2022  Physician: Claudette Laws, MD    Nurse/CMA: Nedra Hai, CMA  Allergies: No Known Allergies  Consent Signed: Yes.    Is patient diabetic? No.  CBG today? .  Pregnant: No. LMP: No LMP for male patient. (age 77-55)  Anticoagulants: yes (Eliquis, been off for 6 days) Anti-inflammatory: no Antibiotics: no  Procedure: Left L4-5 Transforaminal Epidural Steriod  Position: Prone Start Time: 11:05 am  End Time: 11/09  Fluoro Time: 44  RN/CMA Aaryav Hopfensperger,CMA Sonya S    Time 10:49 am 11;19    BP 137/81 137/70    Pulse 56 60    Respirations 16 16    O2 Sat 9 95    S/S 6 6    Pain Level 6/10 7/10     D/C home with wife, patient A & O X 3, D/C instructions reviewed, and sits independently.

## 2022-06-26 NOTE — Telephone Encounter (Signed)
Preop team,   This patient is on Eliquis. Will you please reach out to requesting office to determine if they want anticoagulation held prior to surgery?  Thank you!  DW

## 2022-06-26 NOTE — Patient Instructions (Signed)
You received an epidural steroid injection under fluoroscopic guidance. This is the most accurate way to perform an epidural injection. This injection was performed to relieve thigh or leg or foot pain that may be related to a pinched nerve in the lumbar spine. The local anesthetic injected today may cause numbness in your leg for a couple hours. If it is severe we may need to observe you for 30-60 minutes after the injection. The cortisone medicine injected today may take several days to take full effect. This medicine can also cause facial flushing or feeling of being warm.  This injection may last for days weeks or months. It can be repeated if needed. If it is not effective, another spinal level may need to be injected. Other treatments include medication management as well as physical therapy. In some cases surgery may be an option.  RESTART ELIQUIS TOMORROW FRIDAY 06/27/22

## 2022-06-26 NOTE — Telephone Encounter (Signed)
   Pre-operative Risk Assessment    Patient Name: [name]  DOB: December 05, 1945 MRN: [id number]      Request for Surgical Clearance    Procedure: right carpometacarpal suspensionplasty with a tendon transfer and a right wrist STS release and right index CMC boss incision with assist   Date of Surgery:  Clearance 07/18/22                                 Surgeon:  Dr. Melina Modena Surgeon's Group or Practice Name:  [facility] number:  [phone] Fax number:  [phone]   Type of Clearance Requested:   - Medical    Type of Anesthesia:  axillary block with MAC   Additional requests/questions:    Signed, Selena Zobro   06/26/2022, 12:09 PM

## 2022-06-26 NOTE — Telephone Encounter (Signed)
Copied from CRM [id number]. Topic: Medicare AWV >> Jun 26, 2022  3:42 PM Payton Doughty wrote:  Reason for CRM: Called patient to schedule Medicare Annual Wellness Visit (AWV). Unable to reach patient.  Last date of AWV:   Please schedule an appointment at any time with Donne Anon, CMA  .  If any questions, please contact me.  Thank you ,  Verlee Rossetti; Care Guide Ambulatory Clinical Support [facility] Direct Dial: [phone]

## 2022-06-26 NOTE — Telephone Encounter (Signed)
Per surgeon's office the pt will need to hold Eliquis.

## 2022-06-27 NOTE — Telephone Encounter (Signed)
Patient with diagnosis of afib on Eliquis for anticoagulation.    Procedure: right carpometacarpal suspensionplasty with a tendon transfer and a right wrist STS release and right index CMC boss incision with assist   Date of procedure: 07/18/22  CHA2DS2-VASc Score = 8  This indicates a 10.8% annual risk of stroke. The patient's score is based upon: CHF History: 1 HTN History: 1 Diabetes History: 1 Stroke History: 2 Vascular Disease History: 1 Age Score: 2 Gender Score: 0   Stroke in 1996, TIA in 2018 (subtherapeutic INR on warfarin at the time)  CrCl 44mL/min using adjusted body weight Platelet count 153K  Per office protocol, patient can hold Eliquis for 1-2 days prior to procedure. Resume as soon as safely possible after given elevated CV risk.   **This guidance is not considered finalized until pre-operative APP has relayed final recommendations.**

## 2022-06-27 NOTE — Telephone Encounter (Signed)
   Name: Jasher Klym  DOB: [birth year]  MRN: [id number]  Primary Cardiologist: Donato Schultz, MD   Preoperative team, please contact this patient and set up a phone call appointment for further preoperative risk assessment. Please obtain consent and complete medication review. Thank you for your help.  I confirm that guidance regarding antiplatelet and oral anticoagulation therapy has been completed and, if necessary, noted below. Per pharm D: Patient with diagnosis of afib on Eliquis for anticoagulation.     Procedure: right carpometacarpal suspensionplasty with a tendon transfer and a right wrist STS release and right index CMC boss incision with assist    Date of procedure: 07/18/22   Stroke in 1996, TIA in 2018 (subtherapeutic INR on warfarin at the time)   CrCl 27mL/min using adjusted body weight Platelet count 153K   Per office protocol, patient can hold Eliquis for 1-2 days prior to procedure. Resume as soon as safely possible after given elevated CV risk.    Carlos Levering, NP 06/27/2022, 9:04 AM [facility]

## 2022-06-27 NOTE — Telephone Encounter (Signed)
Spoke with patient who is agreeable to do a tele visit on 4/23 at 10:20 am. Consent and med rec done.

## 2022-07-01 ENCOUNTER — Encounter: Payer: Self-pay | Admitting: Physical Medicine & Rehabilitation

## 2022-07-01 ENCOUNTER — Encounter (HOSPITAL_BASED_OUTPATIENT_CLINIC_OR_DEPARTMENT_OTHER): Payer: Medicare Other | Admitting: Physical Medicine & Rehabilitation

## 2022-07-01 VITALS — BP 151/76 | HR 68 | Ht 75.0 in | Wt 244.0 lb

## 2022-07-01 DIAGNOSIS — G8110 Spastic hemiplegia affecting unspecified side: Secondary | ICD-10-CM

## 2022-07-01 DIAGNOSIS — I69352 Hemiplegia and hemiparesis following cerebral infarction affecting left dominant side: Secondary | ICD-10-CM | POA: Diagnosis not present

## 2022-07-01 DIAGNOSIS — M48062 Spinal stenosis, lumbar region with neurogenic claudication: Secondary | ICD-10-CM | POA: Diagnosis not present

## 2022-07-01 MED ORDER — ONABOTULINUMTOXINA 100 UNITS IJ SOLR
300.0000 [IU] | Freq: Once | INTRAMUSCULAR | Status: AC
Start: 2022-07-01 — End: 2022-07-01
  Administered 2022-07-01: 300 [IU] via INTRAMUSCULAR

## 2022-07-01 MED ORDER — SODIUM CHLORIDE (PF) 0.9 % IJ SOLN
2.0000 mL | Freq: Once | INTRAMUSCULAR | Status: AC
Start: 2022-07-01 — End: 2022-07-01
  Administered 2022-07-01: 2 mL via INTRAVENOUS

## 2022-07-01 NOTE — Progress Notes (Signed)
Botox Injection for spasticity using needle EMG guidance  Dilution: 50 Units/ml Indication: Severe spasticity which interferes with ADL,mobility and/or  hygiene and is unresponsive to medication management and other conservative care Informed consent was obtained after describing risks and benefits of the procedure with the patient. This includes bleeding, bruising, infection, excessive weakness, or medication side effects. A REMS form is on file and signed. Needle: 27g 1" needle electrode Number of units per muscle LEFT UE Biceps50  LEFT LE  Medial Hamstrings250 All injections were done after obtaining appropriate EMG activity and after negative drawback for blood. The patient tolerated the procedure well. Post procedure instructions were given. A followup appointment was made.     

## 2022-07-07 NOTE — Progress Notes (Unsigned)
Virtual Visit via Telephone Note   Because of Idris Edmundson. Len's co-morbid illnesses, he is at least at moderate risk for complications without adequate follow up.  This format is felt to be most appropriate for this patient at this time.  The patient did not have access to video technology/had technical difficulties with video requiring transitioning to audio format only (telephone).  All issues noted in this document were discussed and addressed.  No physical exam could be performed with this format.  Please refer to the patient's chart for his consent to telehealth for [facility].  Evaluation Performed:  Preoperative cardiovascular risk assessment _____________   Date:  07/07/2022   Patient ID:  [name], DOB [birth year], MRN [id number] Patient Location:  Home Provider location:   Office  Primary Care Provider:  Sandford Craze, NP Primary Cardiologist:  Donato Schultz, MD  Chief Complaint / Patient Profile   77 y.o. 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  History of Present Illness    [name] is a 77 y.o. male who presents via audio/video conferencing for a telehealth visit today.  Pt was last seen in cardiology clinic on 06/05/22 by Otilio Saber, PA.  At that time [name] was doing well.  The patient is now pending procedure as outlined above. Since his last visit, he *** denies chest pain, shortness of breath, lower extremity edema, fatigue, palpitations, melena, hematuria, hemoptysis, diaphoresis, weakness, presyncope, syncope,  orthopnea, and PND.   Past Medical History    Past Medical History:  Diagnosis Date   Acute encephalopathy 12/12/2015   Acute respiratory failure with hypoxia and hypercapnia 12/12/2015   Allergic rhinitis 02/13/2010   Qualifier: Diagnosis of  By: Nena Jordan    Angina at rest 10/22/2015   Arthritis    Ataxia 08/10/2013   Atrial fibrillation 11/2018   B12 deficiency 08/11/2013   BACK PAIN, LUMBAR 01/29/2009   Qualifier: Diagnosis of  By: Nena Jordan    BENIGN POSITIONAL VERTIGO 11/23/2009   Qualifier: Diagnosis of  By: Nena Jordan    Chronic diastolic heart failure 04/24/2015   Chronic pain    left sided-Kristeins   Coronary 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 11/04/2015   s/p CABG 2004; CFX DES 2011   Elevated troponin    Essential hypertension 12/12/2006   Qualifier: Diagnosis of  By: Charlsie Quest RMA, Lucy     GERD 12/12/2006   Qualifier: Diagnosis of  By: Tyrone Apple, Lucy     GOUT 12/12/2006   Qualifier: Diagnosis of  By: Charlsie Quest RMA, Lucy     Hemorrhoids    Hyperlipidemia LDL goal <70 12/12/2006   Qualifier: Diagnosis of  By: Charlsie Quest RMA, Lucy      Hypogonadism male 04/10/2011   Hypothyroidism, acquired 02/15/2013   Insomnia 05/21/2013   Long term current use of anticoagulant therapy 06/24/2011   Myocardial infarction    Obstructive sleep apnea-Failed CPAP 06/17/2007   Failed CPAP Using O 2 for sleep    Paroxysmal atrial fibrillation 12/06/2016   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   also 1996; resultant hemiplegia of  dominant side   Thoracic aorta atherosclerosis 04/27/2017   Tubular adenoma of colon 05/2010   Type 2 diabetes mellitus without complication, without long-term current use of insulin 10/05/2007   Qualifier: Diagnosis of  By: Nena Jordan    Vasovagal syncope    Vasovagal syncope    Wears glasses    Past Surgical History:  Procedure Laterality Date   CARDIAC CATHETERIZATION N/A 10/25/2015   Procedure: Left Heart Cath  and Cors/Grafts Angiography;  Surgeon: Lennette Bihari, MD;  Location: [facility] INVASIVE CV LAB;  Service: Cardiovascular;  Laterality: N/A;   CARDIOVERSION  06/20/2011   Procedure: CARDIOVERSION;  Surgeon: Laurey Morale, MD;  Location: [facility] ENDOSCOPY;  Service: Cardiovascular;  Laterality: N/A;   CARDIOVERSION N/A 04/26/2015   Procedure: CARDIOVERSION;  Surgeon: Lars Masson, MD;  Location: [facility] ENDOSCOPY;  Service: Cardiovascular;  Laterality: N/A;   CARDIOVERSION N/A 05/10/2015   Procedure: CARDIOVERSION;  Surgeon: Laurey Morale, MD;  Location: [facility] ENDOSCOPY;  Service: Cardiovascular;  Laterality: N/A;   CARDIOVERSION N/A 12/10/2018   Procedure: CARDIOVERSION;  Surgeon: Thurmon Fair, MD;  Location: [facility] ENDOSCOPY;  Service: Cardiovascular;  Laterality: N/A;   CATARACT EXTRACTION  05/2010   left eye   CATARACT EXTRACTION  04/2010   right eye   COLONOSCOPY WITH PROPOFOL N/A 02/22/2020   Procedure: COLONOSCOPY WITH PROPOFOL;  Surgeon: Shellia Cleverly, DO;  Location: [facility] ENDOSCOPY;  Service: Gastroenterology;  Laterality: N/A;   CORONARY ARTERY BYPASS GRAFT     stent   ESOPHAGOGASTRODUODENOSCOPY  03-25-2005   HEMORRHOID SURGERY N/A 09/05/2021   Procedure: HEMORRHOIDECTOMY WITH LIGATION AND HEMORRHOIDOPEXY;  Surgeon: Karie Soda, MD;  Location: [facility] ORS;  Service: General;  Laterality: N/A;   LEFT HEART CATH AND CORS/GRAFTS ANGIOGRAPHY N/A 02/19/2018   Procedure: LEFT HEART CATH AND CORS/GRAFTS ANGIOGRAPHY;  Surgeon: Swazil[place], Peter M, MD;  Location: [facility] INVASIVE CV LAB;  Service: Cardiovascular;  Laterality: N/A;   NASAL SEPTUM SURGERY     PERCUTANEOUS PLACEMENT INTRAVASCULAR STENT CERVICAL CAROTID ARTERY     03-2009; using a drug-eluting platform of the circumflex cornoray artery with a 3.0 x 18 [employer] Promus drug-eluting platform post dilated to 3.75 with a noncompliant balloon.   POLYPECTOMY  02/22/2020   Procedure: POLYPECTOMY;  Surgeon: Shellia Cleverly, DO;  Location: [facility] ENDOSCOPY;   Service: Gastroenterology;;   RECTAL EXAM UNDER ANESTHESIA N/A 09/05/2021   Procedure: ANORECTAL EXAM UNDER ANESTHESIA;  Surgeon: Karie Soda, MD;  Location: [facility] ORS;  Service: General;  Laterality: N/A;   TEE WITHOUT CARDIOVERSION  06/20/2011   Procedure: TRANSESOPHAGEAL ECHOCARDIOGRAM (TEE);  Surgeon: Laurey Morale, MD;  Location: [facility] ENDOSCOPY;  Service: Cardiovascular;  Laterality: N/A;    Allergies  No Known Allergies  Home Medications    Prior to Admission medications   Medication Sig Start Date End Date Taking? Authorizing Provider  albuterol (VENTOLIN HFA) 108 (90 Base) MCG/ACT inhaler Inhale 1-2 puffs into the lungs every 6 (six) hours as needed for wheezing or shortness of breath.    [provider]  allopurinol (ZYLOPRIM) 100 MG tablet TAKE 2 TABLETS BY MOUTH EVERY DAY 03/16/22   Sandford Craze, NP  atorvastatin (LIPITOR) 40 MG tablet TAKE 1 TABLET BY MOUTH EVERY DAY 03/09/22   Sandford Craze, NP  blood glucose meter kit and supplies KIT Dispense based on patient and insurance preference. Use up to four times daily as directed. (FOR ICD-9 250.00, 250.01). 01/26/19   Leone Brand, NP  calcium carbonate (TUMS - DOSED  IN MG ELEMENTAL CALCIUM) 500 MG chewable tablet Chew 1 tablet by mouth daily as needed for indigestion or heartburn.    [provider]  Continuous Blood Gluc Receiver (FREESTYLE LIBRE 2 READER) DEVI Use as directed 12/11/20   Sandford Craze, NP  Continuous Blood Gluc Sensor (FREESTYLE LIBRE 14 DAY SENSOR) MISC USE AS DIRECTED 12/21/20   Sandford Craze, NP  cyanocobalamin 1000 MCG tablet Take 2,000 mcg by mouth daily.    [provider]  diltiazem (CARDIZEM CD) 180 MG 24 hr capsule Take 1 capsule (180 mg total) by mouth daily. 05/28/22   Duke Salvia, MD  dofetilide (TIKOSYN) 500 MCG capsule Take 1 capsule (500 mcg total) by mouth 2 (two) times daily. 05/28/22   Duke Salvia, MD  ELIQUIS 5 MG TABS tablet TAKE 1 TABLET  BY MOUTH TWICE A DAY 03/17/22   Jake Bathe, MD  ferrous sulfate 325 (65 FE) MG tablet Take 1 tablet (325 mg total) by mouth 2 (two) times daily with a meal. 05/25/19   Sandford Craze, NP  fluticasone (FLONASE) 50 MCG/ACT nasal spray Place 1 spray into both nostrils daily. Patient taking differently: Place 1 spray into both nostrils daily as needed for allergies. 12/24/20   Merrilee Jansky, MD  folic acid (FOLVITE) 1 MG tablet TAKE 1 TABLET BY MOUTH EVERY DAY 04/17/22   Sandford Craze, NP  furosemide (LASIX) 40 MG tablet TAKE 1 TABLET BY MOUTH EVERY DAY AS NEEDED 04/29/21   Sandford Craze, NP  gabapentin (NEURONTIN) 100 MG capsule TAKE 1 CAPSULE BY MOUTH EVERYDAY AT BEDTIME 03/16/22   Sandford Craze, NP  glucose blood (ACCU-CHEK GUIDE) test strip USE AS DIRECTED UP TO 4 TIMES DAILY 03/21/19   Sandford Craze, NP  levothyroxine (SYNTHROID) 112 MCG tablet TAKE 1 TABLET BY MOUTH EVERY DAY BEFORE BREAKFAST 04/17/22   Sandford Craze, NP  lisinopril (ZESTRIL) 5 MG tablet Take 1 tablet (5 mg total) by mouth daily. 04/17/22   Sandford Craze, NP  methocarbamol (ROBAXIN) 500 MG tablet Take 500 mg by mouth every 8 (eight) hours as needed. 09/10/21   [provider]  metoprolol succinate (TOPROL-XL) 25 MG 24 hr tablet TAKE 1 TABLET (25 MG TOTAL) BY MOUTH DAILY. 03/11/22   Duke Salvia, MD  Multiple Vitamins-Minerals (MULTIVITAMIN GUMMIES MENS PO) Take 2 tablets by mouth daily with breakfast.     [provider]  nitroGLYCERIN (NITROSTAT) 0.4 MG SL tablet Place 1 tablet (0.4 mg total) under the tongue every 5 (five) minutes as needed for chest pain. Up to 3 doses 03/05/15   Sandford Craze, NP  OXYGEN Inhale 2 L into the lungs at bedtime.    [provider]  pantoprazole (PROTONIX) 40 MG tablet TAKE 1 TABLET BY MOUTH EVERY DAY BEFORE BREAKFAST 04/11/22   Sandford Craze, NP  sennosides-docusate sodium (SENOKOT-S) 8.6-50 MG tablet Take 1 tablet by  mouth as needed for constipation.    [provider]  traMADol (ULTRAM) 50 MG tablet TAKE 1 TABLET BY MOUTH TWICE A DAY 03/17/22   Kirsteins, Victorino Sparrow, MD  traMADol (ULTRAM-ER) 200 MG 24 hr tablet TAKE 1 TABLET BY MOUTH EVERY DAY 03/17/22   Kirsteins, Victorino Sparrow, MD    Physical Exam    Vital Signs:  [name] does not have vital signs available for review today.***  Given telephonic nature of communication, physical exam is limited. AAOx3. NAD. Normal affect.  Speech and respirations are unlabored.  Accessory Clinical Findings  None  Assessment & Plan    1.  Preoperative Cardiovascular Risk Assessment:  The patient was advised that if he develops new symptoms prior to surgery to contact our office to arrange for a follow-up visit, and he verbalized understanding.  Per office protocol, patient can hold Eliquis for 1-2 days prior to procedure. Resume as soon as safely possible after given elevated CV risk.   (A copy of this note will be routed to requesting surgeon.  Time:   Today, I have spent *** minutes with the patient with telehealth technology discussing medical history, symptoms, and management plan.     Levi Aland, NP  07/07/2022, 11:37 AM

## 2022-07-08 ENCOUNTER — Encounter: Payer: Self-pay | Admitting: Nurse Practitioner

## 2022-07-08 ENCOUNTER — Ambulatory Visit: Payer: Medicare Other | Attending: Interventional Cardiology | Admitting: Nurse Practitioner

## 2022-07-08 DIAGNOSIS — Z01810 Encounter for preprocedural cardiovascular examination: Secondary | ICD-10-CM | POA: Diagnosis not present

## 2022-07-15 ENCOUNTER — Other Ambulatory Visit: Payer: Self-pay | Admitting: Family

## 2022-07-15 MED ORDER — LISINOPRIL 5 MG PO TABS: 5.0000 mg | ORAL_TABLET | Freq: Every day | ORAL | 1 refills | Status: DC

## 2022-07-16 ENCOUNTER — Encounter (HOSPITAL_COMMUNITY): Payer: Self-pay | Admitting: Orthopedic Surgery

## 2022-07-16 ENCOUNTER — Other Ambulatory Visit: Payer: Self-pay

## 2022-07-16 NOTE — Progress Notes (Signed)
PCP - Sandford Craze, NP Cardiologist - Donato Schultz, MD  PPM/ICD - denies  EKG - 06/05/22 Stress Test - 05/07/17 ECHO - 02/18/18 Cardiac Cath - 02/19/18  CPAP - patient is wearing oxygen 2L at night  Fasting Blood Sugar - patient denied having DM; patient is not checking CBG at home   Blood Thinner Instructions: Eliquis - last dose - 07/15/22 per patient Patient was instructed: As of today, STOP taking any Aspirin (unless otherwise instructed by your surgeon) Aleve, Naproxen, Ibuprofen, Motrin, Advil, Goody's, BC's, all herbal medications, fish oil, and all vitamins.  ERAS Protcol - yes, until 04:30 o'clock  COVID TEST- n/a  Anesthesia review: yes  Patient verbally denies any shortness of breath, fever, cough and chest pain during phone call   -------------  SDW INSTRUCTIONS given:  Your procedure is scheduled on Friday, May 3rd, 2024.  Report to [facility] Main Entrance "A" at 05:30 A.M., and check in at the Admitting office.  Call this number if you have problems the morning of surgery:  [phone]   Remember:  Do not eat after midnight the night before your surgery  You may drink clear liquids until 04:30 the morning of your surgery.   Clear liquids allowed are: Water, Non-Citrus Juices (without pulp), Carbonated Beverages, Clear Tea, Black Coffee Only, and Gatorade    Take these medicines the morning of surgery with A SIP OF WATER:  Allopurinol, Lipitor, Cardizem, Tikosyn, Synthroid, Metoprolol, Protonix, Tramadol  PRN: Flonase, inhaler, Robaxin, Nitroglycerin   The day of surgery:                     Do not wear jewelry,             Do not wear lotions, powders, colognes, or deodorant.            Men may shave face and neck.            Do not bring valuables to the hospital.            [facility] is not responsible for any belongings or valuables.  Do NOT Smoke (Tobacco/Vaping) 24 hours prior to your procedure If you use a CPAP at night, you may  bring all equipment for your overnight stay.   Contacts, glasses, dentures or bridgework may not be worn into surgery.      For patients admitted to the hospital, discharge time will be determined by your treatment team.   Patients discharged the day of surgery will not be allowed to drive home, and someone needs to stay with them for 24 hours.    Special instructions:   [place]- Preparing For Surgery  Before surgery, you can play an important role. Because skin is not sterile, your skin needs to be as free of germs as possible. You can reduce the number of germs on your skin by washing with CHG (chlorahexidine gluconate) Soap before surgery.  CHG is an antiseptic cleaner which kills germs and bonds with the skin to continue killing germs even after washing.    Oral Hygiene is also important to reduce your risk of infection.  Remember - BRUSH YOUR TEETH THE MORNING OF SURGERY WITH YOUR REGULAR TOOTHPASTE  Please do not use if you have an allergy to CHG or antibacterial soaps. If your skin becomes reddened/irritated stop using the CHG.  Do not shave (including legs and underarms) for at least 48 hours prior to first CHG shower. It is OK to  shave your face.  Please follow these instructions carefully.   Shower the NIGHT BEFORE SURGERY and the MORNING OF SURGERY with DIAL Soap.   Pat yourself dry with a CLEAN TOWEL.  Wear CLEAN PAJAMAS to bed the night before surgery  Place CLEAN SHEETS on your bed the night of your first shower and DO NOT SLEEP WITH PETS.   Day of Surgery: Please shower morning of surgery  Wear Clean/Comfortable clothing the morning of surgery Do not apply any deodorants/lotions.   Remember to brush your teeth WITH YOUR REGULAR TOOTHPASTE.   Questions were answered. Patient verbalized understanding of instructions.

## 2022-07-17 ENCOUNTER — Encounter (HOSPITAL_COMMUNITY): Payer: Self-pay | Admitting: Orthopedic Surgery

## 2022-07-17 NOTE — Anesthesia Preprocedure Evaluation (Addendum)
Anesthesia Evaluation    Airway Mallampati: III  TM Distance: >3 FB Neck ROM: Full    Dental no notable dental hx.    Pulmonary sleep apnea and Oxygen sleep apnea    Pulmonary exam normal        Cardiovascular hypertension, Pt. on medications + CAD, + Past MI, + CABG (2004) and +CHF   Rhythm:Regular Rate:Normal  Left ventricle: The cavity size was normal. There was mild focal    basal hypertrophy of the septum. Systolic function was normal.    The estimated ejection fraction was in the range of 55% to 60%.    Wall motion was normal; there were no regional wall motion    abnormalities. Doppler parameters are consistent with abnormal    left ventricular relaxation (grade 1 diastolic dysfunction).    Doppler parameters are consistent with elevated ventricular    end-diastolic filling pressure.  - Mitral valve: Calcified annulus. Mildly thickened leaflets .    There was mild regurgitation.  - Left atrium: The atrium was mildly dilated.  - Right ventricle: The cavity size was mildly dilated. Wall    thickness was normal. Systolic function was mildly reduced.  - Right atrium: The atrium was normal in size.  - Tricuspid valve: There was mild regurgitation.  - Pulmonary arteries: Systolic pressure was within the normal    range.  - Inferior vena cava: The vessel was normal in size.  - Pericardium, extracardiac: There was no pericardial effusion.     Neuro/Psych CVA, Residual Symptoms  negative psych ROS   GI/Hepatic Neg liver ROS,GERD  ,,  Endo/Other  diabetesHypothyroidism    Renal/GU   negative genitourinary   Musculoskeletal  (+) Arthritis , Osteoarthritis,    Abdominal Normal abdominal exam  (+)   Peds  Hematology  (+) Blood dyscrasia, anemia   Anesthesia Other Findings   Reproductive/Obstetrics                             Anesthesia Physical Anesthesia Plan  ASA: 3  Anesthesia Plan:  Regional and MAC   Post-op Pain Management: Regional block*   Induction: Intravenous  PONV Risk Score and Plan: 1 and Ondansetron, Dexamethasone, Treatment may vary due to age or medical condition and Propofol infusion  Airway Management Planned: Simple Face Mask and Nasal Cannula  Additional Equipment: None  Intra-op Plan:   Post-operative Plan:   Informed Consent: I have reviewed the patients History and Physical, chart, labs and discussed the procedure including the risks, benefits and alternatives for the proposed anesthesia with the patient or authorized representative who has indicated his/her understanding and acceptance.     Dental advisory given  Plan Discussed with: CRNA  Anesthesia Plan Comments: (PAT note written 07/17/2022 by Shonna Chock, PA-C.  )       Anesthesia Quick Evaluation

## 2022-07-17 NOTE — Progress Notes (Signed)
Pt called wanting to go over the medications that he can take in the AM. We went over each one and he has the list correct.

## 2022-07-17 NOTE — Progress Notes (Signed)
Anesthesia Chart Review: SAME DAY WORK-UP  Case: [id number] Date/Time: 07/18/22 0715   Procedures:      right carpo metacarpal suspensionplasty with abductor tendon transfer right wrist stenosing tenosynovitis release, right index carpo metacarpal boss excision (Right: Thumb)     RELEASE DORSAL COMPARTMENT (DEQUERVAIN) (Right) - Anethesia Axillary block/ MAC   Anesthesia type: Monitor Anesthesia Care   Pre-op diagnosis: right carpo metacarpal arthritis   [facility] / [facility]   Surgeons: Dairl Ponder, MD       DISCUSSION: Patient is a 77 year old male scheduled for the above procedure.  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   Preoperative cardiology input outlined by Eligha Bridegroom, NP on 07/08/22: "Preoperative Cardiovascular Risk Assessment: According to the Revised Cardiac Risk Index (RCRI), his Perioperative Risk of Major Cardiac Event is (%): 6.6. His Functional Capacity in METs is: 5.62 according to the Duke Activity Status Index (DASI)... Per office protocol, patient can hold Eliquis for 1-2 days prior to procedure. Resume as soon as safely possible after given elevated CV risk."  He reported last dose of Eliquis 07/15/2022.  Anesthesia team to evaluate on the day of surgery. Labs as indicated on arrival.   VS:  BP Readings from Last 3 Encounters:  07/01/22 (!) 151/76  06/05/22 (!) 140/78  06/05/22 (!) 140/78   Pulse Readings from Last 3 Encounters:  07/01/22 68  06/05/22 72  06/05/22 72     PROVIDERS: Sandford Craze, NP is PCP  Donato Schultz, MD is cardiologist. Last visit 02/18/22. Continue  medical therapy. 12 month follow-up planned. Sherryl Manges, MD is EP. Last visit 06/05/22 with Maxine Glenn, PA-C. Continue Tikosyn for persistent afib. Volume status mildly up, continue Lasix as needed. No symptoms concerning for ischemia. 6 month follow-up planned. Claudette Laws, MD is PM&R Vilma Meckel, MD is pulmonologist. Last visit 03/14/22 for follow-up nocturnal hypoxemia on 2LO2 at night, chronic elevated right hemidiaphragm. He had stricter pattern on prior PFTs and wrote "Unclear if actual numbers used for reference were normal given how large they are for a man that age.  He is tall so may be.  However he had CT scan high-resolution 2018 revealed no evidence of ILD.  Suspect values were inaccurate." Six month follow-up planned.    LABS: For day of surgery as indicated. Last results in [facility] include: Lab Results  Component Value Date   WBC 5.5 05/27/2022   HGB 13.3 05/27/2022   HCT 39.9 05/27/2022   PLT 153.0 05/27/2022   GLUCOSE 107 (H) 05/27/2022   ALT 27 05/27/2022   AST 30 05/27/2022   NA 141 05/27/2022   K 4.2 05/27/2022   CL 102 05/27/2022   CREATININE 1.03 05/27/2022   BUN 23 05/27/2022   CO2 31 05/27/2022   TSH 2.49 05/27/2022   HGBA1C 6.1 05/27/2022   MICROALBUR 0.7 05/27/2022     EKG: 06/05/2022: Normal sinus rhythm with sinus arrhythmia Left anterior fascicular block Prolonged QT (QT 444 ms, QTc 486 ms)   CV: Long term monitor 02/13/2019 - 12/13/20209 100% atrial fibrillation with avg HR of 88 bpm No pauses, no prolonged bradycardia.    Cardiac cath 02/19/2018 Ost RCA to Prox  RCA lesion is 100% stenosed. Ost LAD to Prox LAD lesion is 95% stenosed. Ost 1st Diag lesion is 100% stenosed. Dist LAD lesion is 95% stenosed. Ost Cx to Prox Cx lesion is 100% stenosed. Prox Cx to Mid Cx lesion is 100% stenosed. LIMA graft was visualized by angiography and is normal in caliber. The graft exhibits no disease. SVG graft was visualized by angiography  and is normal in caliber. The graft exhibits no disease. SVG graft was visualized by angiography and is normal in caliber. The graft exhibits no disease. Origin to Prox Graft lesion is 100% stenosed. LV end diastolic pressure is normal.   1. Left dominant circulation 2. Severe 3 vessel occlusive CAD.    - 95% ostial LAD    - 100% first diagonal    - 100% ostial LCx. This is new since 2017 - was 95% prior    - 100% nondominant RCA 3. Patent LIMA to the LAD. There is chronic severe disease in the apical LAD 4. Patent SVG to the first diagonal and SVG to the first OM as a Y graft. 5. Chronic occlusion of the SVG to terminal LCx 6. Normal LVEDP   Plan: continue medical management. Will resume IV heparin until coumadin is again therapeutic given history of CVA.     Echo 02/18/2018 - Left ventricle: The cavity size was normal. There was mild focal    basal hypertrophy of the septum. Systolic function was normal.    The estimated ejection fraction was in the range of 55% to 60%.    Wall motion was normal; there were no regional wall motion    abnormalities. Doppler parameters are consistent with abnormal    left ventricular relaxation (grade 1 diastolic dysfunction).    Doppler parameters are consistent with elevated ventricular    end-diastolic filling pressure.  - Mitral valve: Calcified annulus. Mildly thickened leaflets .    There was mild regurgitation.  - Left atrium: The atrium was mildly dilated.  - Right ventricle: The cavity size was mildly dilated. Wall    thickness was normal. Systolic function was mildly reduced.  - Right atrium: The atrium was normal in size.  - Tricuspid valve: There was mild regurgitation.  - Pulmonary arteries: Systolic pressure was within the normal    range.  - Inferior vena cava: The vessel was normal in size.  - Pericardium, extracardiac: There was no pericardial effusion.    US Carotid 10/01/2017 Final Interpretation:  - Right Carotid:  Velocities in the right ICA are consistent with a 1-39%  stenosis.  - Left Carotid: Velocities in the left ICA are consistent with a 1-39%  stenosis. Non-hemodynamically significant plaque noted in the CCA. Technically challenging study due to depth of vessels, bilaterally.  - Vertebrals:  Bilateral vertebral arteries demonstrate antegrade flow.  - Subclavians: Normal flow hemodynamics were seen in bilateral subclavian arteries.    Myocardial perfusion 05/07/2017 Nuclear stress EF: 53%. There was no ST segment deviation noted during stress. Small inferolateral defect (mild/moderate intensity) consistent with soft tissue attenuation and/or subendocardial scar. No ischemia This is a low risk study.   Past Medical History:  Diagnosis Date   Acute encephalopathy 12/12/2015   Acute respiratory failure with hypoxia and hypercapnia (HCC) 12/12/2015   Allergic rhinitis 02/13/2010   Qualifier: Diagnosis of  By: Nena Jordan    Angina at rest 10/22/2015   Arthritis    Ataxia 08/10/2013   Atrial fibrillation (HCC) 11/2018   B12 deficiency 08/11/2013  BACK PAIN, LUMBAR 01/29/2009   Qualifier: Diagnosis of  By: Nena Jordan    BENIGN POSITIONAL VERTIGO 11/23/2009   Qualifier: Diagnosis of  By: Nena Jordan    Chronic diastolic heart failure (HCC) 04/24/2015   Chronic pain    left sided-Kristeins   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/04/2015   s/p CABG 2004; CFX DES 2011   Elevated troponin    Essential hypertension 12/12/2006   Qualifier: Diagnosis of  By: Charlsie Quest RMA, Lucy     GERD 12/12/2006   Qualifier: Diagnosis of  By: Charlsie Quest RMA, Lucy     GOUT 12/12/2006   Qualifier: Diagnosis of  By: Charlsie Quest RMA, Lucy     Hemorrhoids    Hyperlipidemia LDL goal <70 12/12/2006   Qualifier: Diagnosis of  By: Charlsie Quest RMA, Lucy      Hypogonadism male 04/10/2011   Hypothyroidism, acquired 02/15/2013   Insomnia 05/21/2013   Long term current use of  anticoagulant therapy 06/24/2011   Myocardial infarction (HCC)    Obstructive sleep apnea-Failed CPAP 06/17/2007   Failed CPAP Using O 2 for sleep    Paroxysmal atrial fibrillation (HCC) 12/06/2016   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; resultant hemiplegia of dominant side   Thoracic aorta atherosclerosis (HCC) 04/27/2017   Tubular adenoma of colon 05/2010   Type 2 diabetes mellitus without complication, without long-term current use of insulin (HCC) 10/05/2007   Qualifier: Diagnosis of  By: Nena Jordan    Vasovagal syncope    Vasovagal syncope    Wears glasses     Past Surgical History:  Procedure Laterality Date   CARDIAC CATHETERIZATION N/A 10/25/2015   Procedure: Left Heart Cath and Cors/Grafts Angiography;  Surgeon: Lennette Bihari, MD;  [facility]: [facility] INVASIVE CV LAB;  Service: Cardiovascular;  Laterality: N/A;   CARDIOVERSION  06/20/2011   Procedure: CARDIOVERSION;  Surgeon: Laurey Morale, MD;  Location: [facility] ENDOSCOPY;  Service: Cardiovascular;  Laterality: N/A;   CARDIOVERSION N/A 04/26/2015   Procedure: CARDIOVERSION;  Surgeon: Lars Masson, MD;  Location: [facility] ENDOSCOPY;  Service: Cardiovascular;  Laterality: N/A;   CARDIOVERSION N/A 05/10/2015   Procedure: CARDIOVERSION;  Surgeon: Laurey Morale, MD;  Location: [facility] ENDOSCOPY;  Service: Cardiovascular;  Laterality: N/A;   CARDIOVERSION N/A 12/10/2018   Procedure: CARDIOVERSION;  Surgeon: Thurmon Fair, MD;  Location: [facility] ENDOSCOPY;  Service: Cardiovascular;  Laterality: N/A;   CATARACT EXTRACTION  05/2010   left eye   CATARACT EXTRACTION  04/2010   right eye   COLONOSCOPY WITH PROPOFOL N/A 02/22/2020   Procedure: COLONOSCOPY WITH PROPOFOL;  Surgeon: Shellia Cleverly, DO;  Location: [facility] ENDOSCOPY;  Service: Gastroenterology;  Laterality: N/A;   CORONARY ARTERY BYPASS GRAFT     stent   ESOPHAGOGASTRODUODENOSCOPY  03-25-2005   HEMORRHOID SURGERY N/A 09/05/2021   Procedure: HEMORRHOIDECTOMY WITH LIGATION AND  HEMORRHOIDOPEXY;  Surgeon: Karie Soda, MD;  Location: [facility] ORS;  Service: General;  Laterality: N/A;   LEFT HEART CATH AND CORS/GRAFTS ANGIOGRAPHY N/A 02/19/2018   Procedure: LEFT HEART CATH AND CORS/GRAFTS ANGIOGRAPHY;  Surgeon: Swazil[place], Peter M, MD;  Location: [facility] INVASIVE CV LAB;  Service: Cardiovascular;  Laterality: N/A;   NASAL SEPTUM SURGERY     PERCUTANEOUS PLACEMENT INTRAVASCULAR STENT CERVICAL CAROTID ARTERY     03-2009; using a drug-eluting platform of the circumflex cornoray artery with a 3.0 x 18 [employer] Promus drug-eluting platform post dilated to 3.75 with a noncompliant balloon.  POLYPECTOMY  02/22/2020   Procedure: POLYPECTOMY;  Surgeon: Shellia Cleverly, DO;  Location: [facility] ENDOSCOPY;  Service: Gastroenterology;;   RECTAL EXAM UNDER ANESTHESIA N/A 09/05/2021   Procedure: ANORECTAL EXAM UNDER ANESTHESIA;  Surgeon: Karie Soda, MD;  Location: [facility] ORS;  Service: General;  Laterality: N/A;   TEE WITHOUT CARDIOVERSION  06/20/2011   Procedure: TRANSESOPHAGEAL ECHOCARDIOGRAM (TEE);  Surgeon: Laurey Morale, MD;  Location: [facility] ENDOSCOPY;  Service: Cardiovascular;  Laterality: N/A;    MEDICATIONS:  dexamethasone (DECADRON) injection 10 mg   iohexol (OMNIPAQUE) 180 MG/ML injection 3 mL   lidocaine (PF) (XYLOCAINE) 1 % injection 2 mL   lidocaine (XYLOCAINE) 1 % (with pres) injection 5 mL    albuterol (VENTOLIN HFA) 108 (90 Base) MCG/ACT inhaler   allopurinol (ZYLOPRIM) 100 MG tablet   atorvastatin (LIPITOR) 40 MG tablet   blood glucose meter kit and supplies KIT   calcium carbonate (TUMS - DOSED IN MG ELEMENTAL CALCIUM) 500 MG chewable tablet   Continuous Blood Gluc Receiver (FREESTYLE LIBRE 2 READER) DEVI   Continuous Blood Gluc Sensor (FREESTYLE LIBRE 14 DAY SENSOR) MISC   cyanocobalamin 1000 MCG tablet   diltiazem (CARDIZEM CD) 180 MG 24 hr capsule   dofetilide (TIKOSYN) 500 MCG capsule   ELIQUIS 5 MG TABS tablet   ferrous sulfate 325 (65 FE) MG tablet   fluticasone  (FLONASE) 50 MCG/ACT nasal spray   folic acid (FOLVITE) 1 MG tablet   furosemide (LASIX) 40 MG tablet   gabapentin (NEURONTIN) 100 MG capsule   glucose blood (ACCU-CHEK GUIDE) test strip   levothyroxine (SYNTHROID) 112 MCG tablet   lisinopril (ZESTRIL) 5 MG tablet   methocarbamol (ROBAXIN) 500 MG tablet   metoprolol succinate (TOPROL-XL) 25 MG 24 hr tablet   Multiple Vitamins-Minerals (MULTIVITAMIN GUMMIES MENS PO)   nitroGLYCERIN (NITROSTAT) 0.4 MG SL tablet   OXYGEN   pantoprazole (PROTONIX) 40 MG tablet   sennosides-docusate sodium (SENOKOT-S) 8.6-50 MG tablet   traMADol (ULTRAM) 50 MG tablet   traMADol (ULTRAM-ER) 200 MG 24 hr tablet    Shonna Chock, PA-C Surgical Short Stay/Anesthesiology [facility] Phone [phone] [facility] Phone [phone] 07/17/2022 10:24 AM

## 2022-07-18 ENCOUNTER — Encounter (HOSPITAL_COMMUNITY): Payer: Self-pay | Admitting: Orthopedic Surgery

## 2022-07-18 ENCOUNTER — Other Ambulatory Visit: Payer: Self-pay

## 2022-07-18 ENCOUNTER — Ambulatory Visit (HOSPITAL_COMMUNITY): Payer: Medicare Other | Admitting: Vascular Surgery

## 2022-07-18 ENCOUNTER — Ambulatory Visit (HOSPITAL_COMMUNITY)
Admission: RE | Admit: 2022-07-18 | Discharge: 2022-07-18 | Disposition: A | Discharge status: Home / Self Care | Payer: Medicare Other | Attending: Orthopedic Surgery | Admitting: Orthopedic Surgery

## 2022-07-18 ENCOUNTER — Encounter (HOSPITAL_COMMUNITY)
Admission: RE | Disposition: A | Discharge status: Home / Self Care | Payer: Self-pay | Source: Home / Self Care | Attending: Orthopedic Surgery

## 2022-07-18 DIAGNOSIS — M1811 Unilateral primary osteoarthritis of first carpometacarpal joint, right hand: Secondary | ICD-10-CM

## 2022-07-18 DIAGNOSIS — Z951 Presence of aortocoronary bypass graft: Secondary | ICD-10-CM | POA: Diagnosis not present

## 2022-07-18 DIAGNOSIS — I2510 Atherosclerotic heart disease of native coronary artery without angina pectoris: Secondary | ICD-10-CM | POA: Diagnosis not present

## 2022-07-18 DIAGNOSIS — E119 Type 2 diabetes mellitus without complications: Secondary | ICD-10-CM | POA: Diagnosis not present

## 2022-07-18 DIAGNOSIS — Z8673 Personal history of transient ischemic attack (TIA), and cerebral infarction without residual deficits: Secondary | ICD-10-CM | POA: Diagnosis not present

## 2022-07-18 DIAGNOSIS — I480 Paroxysmal atrial fibrillation: Secondary | ICD-10-CM | POA: Diagnosis not present

## 2022-07-18 DIAGNOSIS — K219 Gastro-esophageal reflux disease without esophagitis: Secondary | ICD-10-CM | POA: Diagnosis not present

## 2022-07-18 DIAGNOSIS — G4733 Obstructive sleep apnea (adult) (pediatric): Secondary | ICD-10-CM | POA: Diagnosis not present

## 2022-07-18 DIAGNOSIS — I509 Heart failure, unspecified: Secondary | ICD-10-CM

## 2022-07-18 DIAGNOSIS — E039 Hypothyroidism, unspecified: Secondary | ICD-10-CM | POA: Diagnosis not present

## 2022-07-18 DIAGNOSIS — I110 Hypertensive heart disease with heart failure: Secondary | ICD-10-CM | POA: Diagnosis not present

## 2022-07-18 DIAGNOSIS — Z09 Encounter for follow-up examination after completed treatment for conditions other than malignant neoplasm: Secondary | ICD-10-CM | POA: Diagnosis not present

## 2022-07-18 DIAGNOSIS — G8929 Other chronic pain: Secondary | ICD-10-CM | POA: Diagnosis not present

## 2022-07-18 DIAGNOSIS — M25741 Osteophyte, right hand: Secondary | ICD-10-CM | POA: Diagnosis not present

## 2022-07-18 DIAGNOSIS — I252 Old myocardial infarction: Secondary | ICD-10-CM

## 2022-07-18 DIAGNOSIS — M659 Synovitis and tenosynovitis, unspecified: Secondary | ICD-10-CM | POA: Diagnosis not present

## 2022-07-18 DIAGNOSIS — I5032 Chronic diastolic (congestive) heart failure: Secondary | ICD-10-CM | POA: Insufficient documentation

## 2022-07-18 HISTORY — DX: Disorders of diaphragm: J98.6

## 2022-07-18 HISTORY — PX: CARPOMETACARPAL (CMC) FUSION OF THUMB: SHX6290

## 2022-07-18 HISTORY — PX: DORSAL COMPARTMENT RELEASE: SHX5039

## 2022-07-18 LAB — CBC
HCT: 39.8 % (ref 39.0–52.0)
Hemoglobin: 12.9 g/dL — ABNORMAL LOW (ref 13.0–17.0)
MCH: 30.9 pg (ref 26.0–34.0)
MCHC: 32.4 g/dL (ref 30.0–36.0)
MCV: 95.2 fL (ref 80.0–100.0)
Platelets: 135 10*3/uL — ABNORMAL LOW (ref 150–400)
RBC: 4.18 MIL/uL — ABNORMAL LOW (ref 4.22–5.81)
RDW: 12.8 % (ref 11.5–15.5)
WBC: 6 10*3/uL (ref 4.0–10.5)
nRBC: 0 % (ref 0.0–0.2)

## 2022-07-18 LAB — BASIC METABOLIC PANEL
Anion gap: 8 (ref 5–15)
BUN: 22 mg/dL (ref 8–23)
CO2: 30 mmol/L (ref 22–32)
Calcium: 9.3 mg/dL (ref 8.9–10.3)
Chloride: 102 mmol/L (ref 98–111)
Creatinine, Ser: 1.15 mg/dL (ref 0.61–1.24)
GFR, Estimated: >60 mL/min (ref >60)
Glucose, Bld: 106 mg/dL — ABNORMAL HIGH (ref 70–99)
Potassium: 4.1 mmol/L (ref 3.5–5.1)
Sodium: 140 mmol/L (ref 135–145)

## 2022-07-18 LAB — GLUCOSE, CAPILLARY: Glucose-Capillary: 154 mg/dL — ABNORMAL HIGH (ref 70–99)

## 2022-07-18 SURGERY — CARPOMETACARPAL (CMC) FUSION OF THUMB
Anesthesia: Monitor Anesthesia Care | Site: Thumb | Laterality: Right

## 2022-07-18 MED ORDER — PROPOFOL 1000 MG/100ML IV EMUL
INTRAVENOUS | Status: AC
  Filled 2022-07-18: qty 100

## 2022-07-18 MED ORDER — FENTANYL CITRATE (PF) 100 MCG/2ML IJ SOLN: 25.0000 ug | INTRAMUSCULAR | Status: DC | PRN

## 2022-07-18 MED ORDER — ACETAMINOPHEN 10 MG/ML IV SOLN
INTRAVENOUS | Status: AC
  Administered 2022-07-18: 1000 mg via INTRAVENOUS
  Filled 2022-07-18: qty 100

## 2022-07-18 MED ORDER — ORAL CARE MOUTH RINSE: 15.0000 mL | Freq: Once | OROMUCOSAL | Status: AC

## 2022-07-18 MED ORDER — FENTANYL CITRATE (PF) 250 MCG/5ML IJ SOLN
INTRAMUSCULAR | Status: DC | PRN
  Administered 2022-07-18 (×2): 25 ug via INTRAVENOUS

## 2022-07-18 MED ORDER — HYDROCODONE-ACETAMINOPHEN 5-325 MG PO TABS: 1.0000 | ORAL_TABLET | Freq: Four times a day (QID) | ORAL | 0 refills | Status: DC | PRN

## 2022-07-18 MED ORDER — ONDANSETRON HCL 4 MG/2ML IJ SOLN
INTRAMUSCULAR | Status: DC | PRN
  Administered 2022-07-18: 4 mg via INTRAVENOUS

## 2022-07-18 MED ORDER — PROPOFOL 10 MG/ML IV BOLUS
INTRAVENOUS | Status: AC
  Filled 2022-07-18: qty 20

## 2022-07-18 MED ORDER — CHLORHEXIDINE GLUCONATE 0.12 % MT SOLN
15.0000 mL | Freq: Once | OROMUCOSAL | Status: AC
  Administered 2022-07-18: 15 mL via OROMUCOSAL
  Filled 2022-07-18: qty 15

## 2022-07-18 MED ORDER — KETOROLAC TROMETHAMINE 15 MG/ML IJ SOLN
INTRAMUSCULAR | Status: DC | PRN
  Administered 2022-07-18: 15 mg via INTRAVENOUS

## 2022-07-18 MED ORDER — CEFAZOLIN SODIUM-DEXTROSE 2-4 GM/100ML-% IV SOLN
2.0000 g | INTRAVENOUS | Status: AC
  Administered 2022-07-18: 2 g via INTRAVENOUS
  Filled 2022-07-18: qty 100

## 2022-07-18 MED ORDER — ROPIVACAINE HCL 5 MG/ML IJ SOLN
INTRAMUSCULAR | Status: DC | PRN
  Administered 2022-07-18: 30 mL via PERINEURAL

## 2022-07-18 MED ORDER — LACTATED RINGERS IV SOLN: INTRAVENOUS | Status: DC

## 2022-07-18 MED ORDER — ACETAMINOPHEN 10 MG/ML IV SOLN: 1000.0000 mg | Freq: Once | INTRAVENOUS | Status: DC | PRN

## 2022-07-18 MED ORDER — LIDOCAINE-EPINEPHRINE (PF) 2 %-1:200000 IJ SOLN
INTRAMUSCULAR | Status: AC
  Filled 2022-07-18: qty 20

## 2022-07-18 MED ORDER — 0.9 % SODIUM CHLORIDE (POUR BTL) OPTIME
TOPICAL | Status: DC | PRN
  Administered 2022-07-18: 1000 mL

## 2022-07-18 MED ORDER — PROPOFOL 500 MG/50ML IV EMUL
INTRAVENOUS | Status: DC | PRN
  Administered 2022-07-18: 80 ug/kg/min via INTRAVENOUS

## 2022-07-18 MED ORDER — FENTANYL CITRATE (PF) 250 MCG/5ML IJ SOLN
INTRAMUSCULAR | Status: AC
  Filled 2022-07-18: qty 5

## 2022-07-18 MED ORDER — DEXAMETHASONE SODIUM PHOSPHATE 10 MG/ML IJ SOLN
INTRAMUSCULAR | Status: DC | PRN
  Administered 2022-07-18 (×2): 10 mg

## 2022-07-18 MED ORDER — LIDOCAINE 2% (20 MG/ML) 5 ML SYRINGE
INTRAMUSCULAR | Status: DC | PRN
  Administered 2022-07-18: 20 mg via INTRAVENOUS
  Administered 2022-07-18: 40 mg via INTRAVENOUS

## 2022-07-18 MED ORDER — EPHEDRINE SULFATE-NACL 50-0.9 MG/10ML-% IV SOSY
PREFILLED_SYRINGE | INTRAVENOUS | Status: DC | PRN
  Administered 2022-07-18: 5 mg via INTRAVENOUS

## 2022-07-18 MED ORDER — PROPOFOL 10 MG/ML IV BOLUS
INTRAVENOUS | Status: DC | PRN
  Administered 2022-07-18: 40 mg via INTRAVENOUS
  Administered 2022-07-18: 10 mg via INTRAVENOUS
  Administered 2022-07-18: 20 mg via INTRAVENOUS

## 2022-07-18 SURGICAL SUPPLY — 54 items
ANCH SUT SWLK MED 13.5X3.5 (Anchor) ×2 IMPLANT
BAG COUNTER SPONGE SURGICOUNT (BAG) ×3 IMPLANT
BAG SPNG CNTER NS LX DISP (BAG) ×2
BNDG CMPR 5X3 KNIT ELC UNQ LF (GAUZE/BANDAGES/DRESSINGS)
BNDG CMPR 9X4 STRL LF SNTH (GAUZE/BANDAGES/DRESSINGS) ×2
BNDG CMPR STD VLCR NS LF 5.8X3 (GAUZE/BANDAGES/DRESSINGS) ×2
BNDG COHESIVE 1X5 TAN STRL LF (GAUZE/BANDAGES/DRESSINGS) IMPLANT
BNDG ELASTIC 3INX 5YD STR LF (GAUZE/BANDAGES/DRESSINGS) IMPLANT
BNDG ELASTIC 3X5.8 VLCR NS LF (GAUZE/BANDAGES/DRESSINGS) IMPLANT
BNDG ESMARK 4X9 LF (GAUZE/BANDAGES/DRESSINGS) ×3 IMPLANT
BNDG GAUZE DERMACEA FLUFF 4 (GAUZE/BANDAGES/DRESSINGS) IMPLANT
BNDG GZE DERMACEA 4 6PLY (GAUZE/BANDAGES/DRESSINGS)
CLSR STERI-STRIP ANTIMIC 1/2X4 (GAUZE/BANDAGES/DRESSINGS) IMPLANT
CORD BIPOLAR FORCEPS 12FT (ELECTRODE) ×3 IMPLANT
COVER SURGICAL LIGHT HANDLE (MISCELLANEOUS) ×3 IMPLANT
CUFF TOURN SGL QUICK 18X4 (TOURNIQUET CUFF) IMPLANT
CUFF TOURN SGL QUICK 24 (TOURNIQUET CUFF)
CUFF TRNQT CYL 24X4X16.5-23 (TOURNIQUET CUFF) IMPLANT
DRAPE SURG 17X23 STRL (DRAPES) ×3 IMPLANT
DURAPREP 26ML APPLICATOR (WOUND CARE) ×3 IMPLANT
GAUZE SPONGE 2X2 8PLY STRL LF (GAUZE/BANDAGES/DRESSINGS) IMPLANT
GAUZE SPONGE 4X4 12PLY STRL (GAUZE/BANDAGES/DRESSINGS) IMPLANT
GAUZE XEROFORM 1X8 LF (GAUZE/BANDAGES/DRESSINGS) IMPLANT
GLOVE SURG SYN 8.0 (GLOVE) ×2 IMPLANT
GLOVE SURG SYN 8.0 PF PI (GLOVE) ×3 IMPLANT
GOWN STRL REUS W/ TWL LRG LVL3 (GOWN DISPOSABLE) ×3 IMPLANT
GOWN STRL REUS W/ TWL XL LVL3 (GOWN DISPOSABLE) ×3 IMPLANT
GOWN STRL REUS W/TWL LRG LVL3 (GOWN DISPOSABLE) ×2
GOWN STRL REUS W/TWL XL LVL3 (GOWN DISPOSABLE) ×2
IMPL SWIVELOCK 3.5X13.5 (Anchor) IMPLANT
IMPLANT SWIVELOCK 3.5X13.5 (Anchor) ×2 IMPLANT
KIT BASIN OR (CUSTOM PROCEDURE TRAY) ×3 IMPLANT
KIT SWIVELOCK DX 3.5X8.5 DISP (KITS) IMPLANT
KIT TURNOVER KIT B (KITS) ×3 IMPLANT
MANIFOLD NEPTUNE II (INSTRUMENTS) ×3 IMPLANT
NDL HYPO 25GX1X1/2 BEV (NEEDLE) IMPLANT
NEEDLE HYPO 25GX1X1/2 BEV (NEEDLE) IMPLANT
NS IRRIG 1000ML POUR BTL (IV SOLUTION) ×3 IMPLANT
PACK ORTHO EXTREMITY (CUSTOM PROCEDURE TRAY) ×3 IMPLANT
PAD ARMBOARD 7.5X6 YLW CONV (MISCELLANEOUS) ×6 IMPLANT
PAD CAST 3X4 CTTN HI CHSV (CAST SUPPLIES) IMPLANT
PADDING CAST COTTON 3X4 STRL (CAST SUPPLIES)
SPLINT PLASTER CAST FAST 3X15 (CAST SUPPLIES) IMPLANT
STRIP CLOSURE SKIN 1/2X4 (GAUZE/BANDAGES/DRESSINGS) IMPLANT
SUCTION FRAZIER HANDLE 10FR (MISCELLANEOUS)
SUCTION TUBE FRAZIER 10FR DISP (MISCELLANEOUS) IMPLANT
SUT ETHILON 5 0 PS 2 18 (SUTURE) IMPLANT
SUT PROLENE 3 0 PS 2 (SUTURE) IMPLANT
SYR CONTROL 10ML LL (SYRINGE) IMPLANT
TOWEL GREEN STERILE (TOWEL DISPOSABLE) ×3 IMPLANT
TOWEL GREEN STERILE FF (TOWEL DISPOSABLE) ×3 IMPLANT
TUBE CONNECTING 12X1/4 (SUCTIONS) IMPLANT
UNDERPAD 30X36 HEAVY ABSORB (UNDERPADS AND DIAPERS) ×3 IMPLANT
WATER STERILE IRR 1000ML POUR (IV SOLUTION) ×3 IMPLANT

## 2022-07-18 NOTE — Transfer of Care (Signed)
Immediate Anesthesia Transfer of Care Note  Patient: [name]  Procedure(s) Performed: right carpo metacarpal suspensionplasty with abductor tendon transfer right wrist stenosing tenosynovitis release, right index carpo metacarpal boss excision (Right: Thumb) RELEASE DORSAL COMPARTMENT (DEQUERVAIN) (Right)  Patient Location: PACU  Anesthesia Type:MAC  Level of Consciousness: drowsy  Airway & Oxygen Therapy: Patient Spontanous Breathing  Post-op Assessment: Report given to RN and Post -op Vital signs reviewed and stable  Post vital signs: Reviewed and stable  Last Vitals:  Vitals Value Taken Time  BP 127/76 07/18/22 0922  Temp    Pulse 48 07/18/22 0927  Resp 16 07/18/22 0927  SpO2 90 % 07/18/22 0927  Vitals shown include unvalidated device data.  Last Pain:  Vitals:   07/18/22 0643  PainSc: 0-No pain         Complications: No notable events documented.

## 2022-07-18 NOTE — Brief Op Note (Signed)
07/18/2022  8:57 AM  PATIENT:  [name]  77 y.o. male  PRE-OPERATIVE DIAGNOSIS:  right carpo metacarpal arthritis  POST-OPERATIVE DIAGNOSIS:  right carpo metacarpal arthritis  PROCEDURE:  Procedure(s) with comments: right carpo metacarpal suspensionplasty with abductor tendon transfer right wrist stenosing tenosynovitis release, right index carpo metacarpal boss excision (Right) RELEASE DORSAL COMPARTMENT (DEQUERVAIN) (Right) - Anethesia Axillary block/ MAC  SURGEON:  Surgeon(s) and Role:    Dairl Ponder, MD - Primary  PHYSICIAN ASSISTANT:   ASSISTANTS: robert currance   ANESTHESIA:   regional  EBL: Minimal  BLOOD ADMINISTERED:none  DRAINS: none   LOCAL MEDICATIONS USED:  NONE  SPECIMEN:  No Specimen  DISPOSITION OF SPECIMEN:  N/A  COUNTS:  YES  TOURNIQUET:  * Missing tourniquet times found for documented tourniquets in log: [id number] *  DICTATION: .Reubin Milan Dictation  PLAN OF CARE: Discharge to home after PACU  PATIENT DISPOSITION:  PACU - hemodynamically stable.   Delay start of Pharmacological VTE agent (>24hrs) due to surgical blood loss or risk of bleeding: not applicable

## 2022-07-18 NOTE — H&P (Signed)
[name] is an 77 y.o. male.   Chief Complaint: Right thumb and wrist pain HPI: Patient is a very pleasant 77 year old male with symptomatic right wrist first dorsal compartment tenosynovitis and right thumb CMC arthritis and symptomatic right index finger metacarpal boss.  Past Medical History:  Diagnosis Date   Acute encephalopathy 12/12/2015   Acute respiratory failure with hypoxia and hypercapnia (HCC) 12/12/2015   Allergic rhinitis 02/13/2010   Qualifier: Diagnosis of  By: Nena Jordan    Angina at rest 10/22/2015   Arthritis    Ataxia 08/10/2013   Atrial fibrillation (HCC) 11/2018   B12 deficiency 08/11/2013   BACK PAIN, LUMBAR 01/29/2009   Qualifier: Diagnosis of  By: Nena Jordan    BENIGN POSITIONAL VERTIGO 11/23/2009   Qualifier: Diagnosis of  By: Nena Jordan    Chronic diastolic heart failure (HCC) 04/24/2015   Chronic pain    left sided-Kristeins   Chronically elevated hemidiaphragm    right   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/04/2015   s/p CABG 2004; CFX DES 2011   Elevated troponin    Essential hypertension 12/12/2006   Qualifier: Diagnosis of  By: Charlsie Quest RMA, Lucy     GERD 12/12/2006   Qualifier: Diagnosis of  By: Tyrone Apple, Lucy     GOUT 12/12/2006   Qualifier: Diagnosis of  By: Charlsie Quest RMA, Lucy     Hemorrhoids    Hyperlipidemia LDL goal <70 12/12/2006   Qualifier: Diagnosis of  By: Charlsie Quest RMA, Lucy      Hypogonadism male 04/10/2011   Hypothyroidism, acquired 02/15/2013   Insomnia 05/21/2013   Long term current use of anticoagulant therapy 06/24/2011   Myocardial infarction (HCC)    Obstructive sleep apnea-Failed CPAP 06/17/2007   Failed CPAP Using O 2 for sleep    Paroxysmal atrial fibrillation (HCC) 12/06/2016   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; resultant hemiplegia of dominant side   Thoracic aorta atherosclerosis (HCC) 04/27/2017   Tubular adenoma of colon 05/2010   Type 2  diabetes mellitus without complication, without long-term current use of insulin (HCC) 10/05/2007   Qualifier: Diagnosis of  By: Nena Jordan    Vasovagal syncope    Vasovagal syncope    Wears glasses     Past Surgical History:  Procedure Laterality Date   CARDIAC CATHETERIZATION N/A 10/25/2015   Procedure: Left Heart Cath and Cors/Grafts Angiography;  Surgeon: Lennette Bihari, MD;  Location: [facility] INVASIVE CV LAB;  Service: Cardiovascular;  Laterality: N/A;   CARDIOVERSION  06/20/2011   Procedure: CARDIOVERSION;  Surgeon: Laurey Morale, MD;  Location: [facility] ENDOSCOPY;  Service: Cardiovascular;  Laterality: N/A;   CARDIOVERSION N/A 04/26/2015   Procedure: CARDIOVERSION;  Surgeon: Lars Masson, MD;  Location: [facility] ENDOSCOPY;  Service: Cardiovascular;  Laterality: N/A;   CARDIOVERSION N/A 05/10/2015   Procedure: CARDIOVERSION;  Surgeon: Laurey Morale, MD;  Location: [facility] ENDOSCOPY;  Service: Cardiovascular;  Laterality: N/A;   CARDIOVERSION N/A 12/10/2018   Procedure: CARDIOVERSION;  Surgeon: Thurmon Fair, MD;  Location: [facility] ENDOSCOPY;  Service: Cardiovascular;  Laterality: N/A;   CATARACT EXTRACTION  05/2010   left eye   CATARACT EXTRACTION  04/2010   right eye   COLONOSCOPY WITH PROPOFOL N/A 02/22/2020   Procedure: COLONOSCOPY WITH PROPOFOL;  Surgeon: Shellia Cleverly, DO;  Location: [facility] ENDOSCOPY;  Service: Gastroenterology;  Laterality: N/A;   CORONARY ARTERY BYPASS GRAFT  stent   ESOPHAGOGASTRODUODENOSCOPY  03-25-2005   HEMORRHOID SURGERY N/A 09/05/2021   Procedure: HEMORRHOIDECTOMY WITH LIGATION AND HEMORRHOIDOPEXY;  Surgeon: Karie Soda, MD;  Location: [facility] ORS;  Service: General;  Laterality: N/A;   LEFT HEART CATH AND CORS/GRAFTS ANGIOGRAPHY N/A 02/19/2018   Procedure: LEFT HEART CATH AND CORS/GRAFTS ANGIOGRAPHY;  Surgeon: Swaziland, Peter M, MD;  Location: [facility] INVASIVE CV LAB;  Service: Cardiovascular;  Laterality: N/A;   NASAL SEPTUM SURGERY     PERCUTANEOUS PLACEMENT INTRAVASCULAR  STENT CERVICAL CAROTID ARTERY     03-2009; using a drug-eluting platform of the circumflex cornoray artery with a 3.0 x 18 [employer] Promus drug-eluting platform post dilated to 3.75 with a noncompliant balloon.   POLYPECTOMY  02/22/2020   Procedure: POLYPECTOMY;  Surgeon: Shellia Cleverly, DO;  Location: [facility] ENDOSCOPY;  Service: Gastroenterology;;   RECTAL EXAM UNDER ANESTHESIA N/A 09/05/2021   Procedure: ANORECTAL EXAM UNDER ANESTHESIA;  Surgeon: Karie Soda, MD;  Location: [facility] ORS;  Service: General;  Laterality: N/A;   TEE WITHOUT CARDIOVERSION  06/20/2011   Procedure: TRANSESOPHAGEAL ECHOCARDIOGRAM (TEE);  Surgeon: Laurey Morale, MD;  Location: [facility] ENDOSCOPY;  Service: Cardiovascular;  Laterality: N/A;    Family History  Problem Relation Age of Onset   Lung cancer Father        deceased   Stroke Mother        deceased-MINISTROKES   Colon cancer Neg Hx    Esophageal cancer Neg Hx    Stomach cancer Neg Hx    Pancreatic cancer Neg Hx    Liver disease Neg Hx    Social History:  reports that he has never smoked. He has never used smokeless tobacco. He reports that he does not currently use alcohol. He reports that he does not use drugs.  Allergies: No Known Allergies  Facility-Administered Medications Prior to Admission  Medication Dose Route Frequency Provider Last Rate Last Admin   dexamethasone (DECADRON) injection 10 mg  10 mg Intravenous Once Kirsteins, Victorino Sparrow, MD       iohexol (OMNIPAQUE) 180 MG/ML injection 3 mL  3 mL Other Once Kirsteins, Victorino Sparrow, MD       lidocaine (PF) (XYLOCAINE) 1 % injection 2 mL  2 mL Other Once Kirsteins, Victorino Sparrow, MD       lidocaine (XYLOCAINE) 1 % (with pres) injection 5 mL  5 mL Other Once Kirsteins, Victorino Sparrow, MD       Medications Prior to Admission  Medication Sig Dispense Refill   allopurinol (ZYLOPRIM) 100 MG tablet TAKE 2 TABLETS BY MOUTH EVERY DAY 180 tablet 1   atorvastatin (LIPITOR) 40 MG tablet TAKE 1 TABLET BY MOUTH EVERY DAY 90  tablet 1   calcium carbonate (TUMS - DOSED IN MG ELEMENTAL CALCIUM) 500 MG chewable tablet Chew 1 tablet by mouth daily as needed for indigestion or heartburn.     cyanocobalamin 1000 MCG tablet Take 2,000 mcg by mouth daily.     diltiazem (CARDIZEM CD) 180 MG 24 hr capsule Take 1 capsule (180 mg total) by mouth daily. 90 capsule 0   dofetilide (TIKOSYN) 500 MCG capsule Take 1 capsule (500 mcg total) by mouth 2 (two) times daily. 180 capsule 0   folic acid (FOLVITE) 1 MG tablet TAKE 1 TABLET BY MOUTH EVERY DAY 90 tablet 1   furosemide (LASIX) 40 MG tablet TAKE 1 TABLET BY MOUTH EVERY DAY AS NEEDED 90 tablet 1   gabapentin (NEURONTIN) 100 MG capsule TAKE 1 CAPSULE BY MOUTH EVERYDAY  AT BEDTIME 90 capsule 1   levothyroxine (SYNTHROID) 112 MCG tablet TAKE 1 TABLET BY MOUTH EVERY DAY BEFORE BREAKFAST 90 tablet 0   lisinopril (ZESTRIL) 5 MG tablet Take 1 tablet (5 mg total) by mouth daily. 90 tablet 1   metoprolol succinate (TOPROL-XL) 25 MG 24 hr tablet TAKE 1 TABLET (25 MG TOTAL) BY MOUTH DAILY. 90 tablet 3   Multiple Vitamins-Minerals (MULTIVITAMIN GUMMIES MENS PO) Take 2 tablets by mouth daily with breakfast.      OXYGEN Inhale 2 L into the lungs at bedtime.     pantoprazole (PROTONIX) 40 MG tablet TAKE 1 TABLET BY MOUTH EVERY DAY BEFORE BREAKFAST 90 tablet 1   sennosides-docusate sodium (SENOKOT-S) 8.6-50 MG tablet Take 1 tablet by mouth as needed for constipation.     traMADol (ULTRAM) 50 MG tablet TAKE 1 TABLET BY MOUTH TWICE A DAY 180 tablet 1   traMADol (ULTRAM-ER) 200 MG 24 hr tablet TAKE 1 TABLET BY MOUTH EVERY DAY 90 tablet 1   albuterol (VENTOLIN HFA) 108 (90 Base) MCG/ACT inhaler Inhale 1-2 puffs into the lungs every 6 (six) hours as needed for wheezing or shortness of breath.     blood glucose meter kit and supplies KIT Dispense based on patient and insurance preference. Use up to four times daily as directed. (FOR ICD-9 250.00, 250.01). 102 each 0   Continuous Blood Gluc Receiver  (FREESTYLE LIBRE 2 READER) DEVI Use as directed 1 each 0   Continuous Blood Gluc Sensor (FREESTYLE LIBRE 14 DAY SENSOR) MISC USE AS DIRECTED 1 each 5   ELIQUIS 5 MG TABS tablet TAKE 1 TABLET BY MOUTH TWICE A DAY 180 tablet 1   ferrous sulfate 325 (65 FE) MG tablet Take 1 tablet (325 mg total) by mouth 2 (two) times daily with a meal. 180 tablet 1   fluticasone (FLONASE) 50 MCG/ACT nasal spray Place 1 spray into both nostrils daily. (Patient taking differently: Place 1 spray into both nostrils daily as needed for allergies.) 16 g 0   glucose blood (ACCU-CHEK GUIDE) test strip USE AS DIRECTED UP TO 4 TIMES DAILY 100 strip 0   methocarbamol (ROBAXIN) 500 MG tablet Take 500 mg by mouth every 8 (eight) hours as needed.     nitroGLYCERIN (NITROSTAT) 0.4 MG SL tablet Place 1 tablet (0.4 mg total) under the tongue every 5 (five) minutes as needed for chest pain. Up to 3 doses 10 tablet 2    No results found. However, due to the size of the patient record, not all encounters were searched. Please check Results Review for a complete set of results. No results found.  Review of Systems  All other systems reviewed and are negative.   Blood pressure (!) 145/70, pulse (!) 57, temperature 98.3 F (36.8 C), resp. rate 18, height 6\' 3"  (1.905 m), weight 110.2 kg, SpO2 95 %. Physical Exam Constitutional:      Appearance: Normal appearance.  HENT:     Head: Normocephalic and atraumatic.  Eyes:     Pupils: Pupils are equal, round, and reactive to light.  Cardiovascular:     Rate and Rhythm: Normal rate.  Pulmonary:     Effort: Pulmonary effort is normal.  Musculoskeletal:     Right hand: Swelling and bony tenderness present. Decreased range of motion.     Cervical back: Normal range of motion.     Comments: Right thumb and wrist CMC arthropathy with positive Finkelstein's and positive CMC grind and symptomatic right index finger carpal  metacarpal boss  Skin:    General: Skin is warm.  Neurological:      General: No focal deficit present.     Mental Status: He is alert and oriented to person, place, and time.  Psychiatric:        Mood and Affect: Mood normal.        Behavior: Behavior normal.        Thought Content: Thought content normal.        Judgment: Judgment normal.      Assessment/Plan 77 year old male with symptomatic right wrist first dorsal compartment tenosynovitis and right thumb CMC arthritis and symptomatic index finger metacarpal carpal boss despite conservative measures.  Have discussed the role of continuation of conservative care versus surgical intervention.  Patient wants to proceed with suspensioplasty with tendon transfer, first dorsal compartment release, and excision of index finger CMC boss.  Patient understands risk and benefits the fact we cannot guarantee complete pain relief and that there may be need for further surgery in the future.  Will do this as an outpatient under regional anesthetic today.  Marlowe Shores, MD 07/18/2022, 6:59 AM

## 2022-07-18 NOTE — Anesthesia Postprocedure Evaluation (Signed)
Anesthesia Post Note  Patient: [name]  Procedure(s) Performed: right carpo metacarpal suspensionplasty with abductor tendon transfer right wrist stenosing tenosynovitis release, right index carpo metacarpal boss excision (Right: Thumb) RELEASE DORSAL COMPARTMENT (DEQUERVAIN) (Right)     Patient location during evaluation: PACU Anesthesia Type: Regional and MAC Level of consciousness: awake and alert Pain management: pain level controlled Vital Signs Assessment: post-procedure vital signs reviewed and stable Respiratory status: spontaneous breathing, nonlabored ventilation, respiratory function stable and patient connected to nasal cannula oxygen Cardiovascular status: stable and blood pressure returned to baseline Postop Assessment: no apparent nausea or vomiting Anesthetic complications: no   No notable events documented.  Last Vitals:  Vitals:   07/18/22 0930 07/18/22 0945  BP: 110/72 131/71  Pulse: (!) 49 (!) 48  Resp: 18 16  Temp:  (!) 36.3 C  SpO2: 92% 91%    Last Pain:  Vitals:   07/18/22 0945  PainSc: 5                  Krithika Tome P Jericha Bryden

## 2022-07-18 NOTE — Anesthesia Procedure Notes (Signed)
Anesthesia Regional Block: Supraclavicular block   Pre-Anesthetic Checklist: , timeout performed,  Correct Patient, Correct Site, Correct Laterality,  Correct Procedure, Correct Position, site marked,  Risks and benefits discussed,  Surgical consent,  Pre-op evaluation,  At surgeon's request and post-op pain management  Laterality: Right  Prep: Dura Prep       Needles:  Injection technique: Single-shot  Needle Type: Echogenic Stimulator Needle     Needle Length: 5cm  Needle Gauge: 20     Additional Needles:   Procedures:,,,, ultrasound used (permanent image in chart),,    Narrative:  Start time: 07/18/2022 6:45 AM End time: 07/18/2022 6:50 AM Injection made incrementally with aspirations every 5 mL.  Performed by: Personally  Anesthesiologist: Atilano Median, DO  Additional Notes: Patient identified. Risks/Benefits/Options discussed with patient including but not limited to bleeding, infection, nerve damage, failed block, incomplete pain control. Patient expressed understanding and wished to proceed. All questions were answered. Sterile technique was used throughout the entire procedure. Please see nursing notes for vital signs. Aspirated in 5cc intervals with injection for negative confirmation. Patient was given instructions on fall risk and not to get out of bed. All questions and concerns addressed with instructions to call with any issues or inadequate analgesia.

## 2022-07-18 NOTE — Op Note (Signed)
Patient was taken the operating suite and after induction of adequate regional anesthetic and IV sedation right upper extremity was prepped and draped in the usual sterile fashion.  An Esmarch was used to exsanguinate the limb tourniquet was then inflated 2 and 50 mmHg.  This point time incision made over the dorsal radial aspect of the right wrist and thumb area and dissection was carried down the first dorsal compartment.  First dorsal compartment identified and released at its dorsal most extent.  The APL and EPB tendons were lysed of all adhesions.  We then carefully identified the snuffbox artery and retracted proximally.  We performed [facility] arthrotomy and a subperiosteal dissection of the thumb metacarpal base and trapezium.  The trapezium was then removed in its entirety and a complete CMC synovectomy was performed.  Next using a 3.5 mm cannulated drill bit we created transosseous canals and the thumb and index metacarpal bases.  Osseous debris was debrided with irrigation and we harvested the radial ulnar slip of the abductor pollicis longus tendon group at the musculotendinous junction and pulled in the distal wound.  We sutured to the dorsal capsule and then used a 2-0 FiberWire suture to whipstitch the tendon transfer.  We then passed it from dorsal to volar at the thumb metacarpal and from volar to dorsal at the index metacarpal and on the appropriate tension placed a 3.5 mm Arthrex anchor to create our suspension.  The remaining tail of the APL tendon was then sutured to the extensor carpi radialis brevis tendon dorsally.  Lastly we made a subperiosteal section of a symptomatic index and long CMC boss and removed using a osteotome.  We irrigated and then closed the periosteum over the boss with 4-0 Vicryl, 4-0 Vicryl to close the [facility] capsule, 4-0 Vicryl subcutaneously and 3-0 Prolene subcuticular stitch on the skin.  Steri-Strips, 4 x 4's, and a radial gutter splint was applied.  The patient tolerated  these procedures well and went to recovery in stable fashion.

## 2022-07-23 ENCOUNTER — Encounter (HOSPITAL_COMMUNITY): Payer: Self-pay | Admitting: Orthopedic Surgery

## 2022-07-25 DIAGNOSIS — M79644 Pain in right finger(s): Secondary | ICD-10-CM | POA: Diagnosis not present

## 2022-07-25 DIAGNOSIS — M19031 Primary osteoarthritis, right wrist: Secondary | ICD-10-CM | POA: Diagnosis not present

## 2022-08-20 DIAGNOSIS — G5603 Carpal tunnel syndrome, bilateral upper limbs: Secondary | ICD-10-CM | POA: Diagnosis not present

## 2022-08-20 DIAGNOSIS — M79641 Pain in right hand: Secondary | ICD-10-CM | POA: Diagnosis not present

## 2022-08-27 DIAGNOSIS — M79641 Pain in right hand: Secondary | ICD-10-CM | POA: Diagnosis not present

## 2022-08-27 DIAGNOSIS — M79644 Pain in right finger(s): Secondary | ICD-10-CM | POA: Diagnosis not present

## 2022-08-27 DIAGNOSIS — M25641 Stiffness of right hand, not elsewhere classified: Secondary | ICD-10-CM | POA: Diagnosis not present

## 2022-08-27 DIAGNOSIS — M19031 Primary osteoarthritis, right wrist: Secondary | ICD-10-CM | POA: Diagnosis not present

## 2022-09-02 DIAGNOSIS — H43813 Vitreous degeneration, bilateral: Secondary | ICD-10-CM | POA: Diagnosis not present

## 2022-09-02 DIAGNOSIS — H33101 Unspecified retinoschisis, right eye: Secondary | ICD-10-CM | POA: Diagnosis not present

## 2022-09-02 DIAGNOSIS — E119 Type 2 diabetes mellitus without complications: Secondary | ICD-10-CM | POA: Diagnosis not present

## 2022-09-02 DIAGNOSIS — H35373 Puckering of macula, bilateral: Secondary | ICD-10-CM | POA: Diagnosis not present

## 2022-09-11 ENCOUNTER — Other Ambulatory Visit: Payer: Self-pay | Admitting: Physical Medicine & Rehabilitation

## 2022-09-15 ENCOUNTER — Other Ambulatory Visit: Payer: Self-pay | Admitting: Family

## 2022-09-15 ENCOUNTER — Telehealth: Payer: Self-pay | Admitting: Family

## 2022-09-15 DIAGNOSIS — E119 Type 2 diabetes mellitus without complications: Secondary | ICD-10-CM | POA: Diagnosis not present

## 2022-09-15 DIAGNOSIS — H35373 Puckering of macula, bilateral: Secondary | ICD-10-CM | POA: Diagnosis not present

## 2022-09-15 DIAGNOSIS — H33101 Unspecified retinoschisis, right eye: Secondary | ICD-10-CM | POA: Diagnosis not present

## 2022-09-15 DIAGNOSIS — H43813 Vitreous degeneration, bilateral: Secondary | ICD-10-CM | POA: Diagnosis not present

## 2022-09-15 MED ORDER — LISINOPRIL 5 MG PO TABS: 5.0000 mg | ORAL_TABLET | Freq: Every day | ORAL | 1 refills | Status: DC

## 2022-09-15 NOTE — Telephone Encounter (Signed)
Please advise 

## 2022-09-15 NOTE — Telephone Encounter (Signed)
Patient called regarding the medication benazepril 10 mg, please call patient for further clarification. Patient Is currently out of medication .

## 2022-09-15 NOTE — Telephone Encounter (Signed)
See order.

## 2022-09-16 ENCOUNTER — Other Ambulatory Visit: Payer: Self-pay | Admitting: Family

## 2022-09-16 MED ORDER — BENAZEPRIL HCL 10 MG PO TABS: 10.0000 mg | ORAL_TABLET | Freq: Every day | ORAL | 1 refills | Status: DC

## 2022-09-16 NOTE — Telephone Encounter (Signed)
Please advise- Pt states he is supposed to be on benazepril?

## 2022-09-16 NOTE — Telephone Encounter (Signed)
Pt called back to follow up on meds status. Advised pt lisinopril was sent in yesterday (7.1.24). Pt stated that he should be on benazepril and that he needed it called into the pharmacy as he is currently out.

## 2022-09-16 NOTE — Addendum Note (Signed)
Addended by: Sandford Craze on: 09/16/2022 11:41 AM   Modules accepted: Orders

## 2022-09-17 DIAGNOSIS — H35371 Puckering of macula, right eye: Secondary | ICD-10-CM | POA: Diagnosis not present

## 2022-09-21 ENCOUNTER — Other Ambulatory Visit: Payer: Self-pay | Admitting: Family

## 2022-09-22 ENCOUNTER — Other Ambulatory Visit: Payer: Self-pay | Admitting: Internal Medicine

## 2022-09-26 ENCOUNTER — Other Ambulatory Visit: Payer: Self-pay | Admitting: Family

## 2022-10-02 ENCOUNTER — Encounter: Payer: Medicare Other | Attending: Physical Medicine & Rehabilitation | Admitting: Physical Medicine & Rehabilitation

## 2022-10-02 DIAGNOSIS — G8110 Spastic hemiplegia affecting unspecified side: Secondary | ICD-10-CM | POA: Insufficient documentation

## 2022-10-02 DIAGNOSIS — M48062 Spinal stenosis, lumbar region with neurogenic claudication: Secondary | ICD-10-CM | POA: Insufficient documentation

## 2022-10-02 DIAGNOSIS — I69352 Hemiplegia and hemiparesis following cerebral infarction affecting left dominant side: Secondary | ICD-10-CM | POA: Insufficient documentation

## 2022-10-03 ENCOUNTER — Telehealth: Payer: Self-pay | Admitting: Family

## 2022-10-03 NOTE — Telephone Encounter (Signed)
Patient came to the front desk requesting an rx for walker.  Rx handwritten and given to the patient.

## 2022-10-05 ENCOUNTER — Other Ambulatory Visit: Payer: Self-pay | Admitting: Internal Medicine

## 2022-10-05 ENCOUNTER — Other Ambulatory Visit: Payer: Self-pay | Admitting: Family

## 2022-10-05 DIAGNOSIS — Z8639 Personal history of other endocrine, nutritional and metabolic disease: Secondary | ICD-10-CM

## 2022-10-06 ENCOUNTER — Other Ambulatory Visit: Payer: Self-pay

## 2022-10-06 ENCOUNTER — Telehealth: Payer: Self-pay | Admitting: Family

## 2022-10-06 DIAGNOSIS — Z8639 Personal history of other endocrine, nutritional and metabolic disease: Secondary | ICD-10-CM

## 2022-10-06 MED ORDER — LEVOTHYROXINE SODIUM 112 MCG PO TABS
112.0000 ug | ORAL_TABLET | Freq: Every day | ORAL | 0 refills | Status: DC
Start: 2022-10-06 — End: 2023-01-04

## 2022-10-06 NOTE — Telephone Encounter (Signed)
Patient called and would like a med refill on Levpthyroxine and please send to [address].

## 2022-10-06 NOTE — Telephone Encounter (Signed)
Attempted phone call to pt to verify he is still taking Dofetilide as it looks like medication was discontinued on 07/18/2022 by Dr Dairl Ponder.    Home phone message states - will not accept call from this number.  Attempted phone call to mobile number.  Message states call cannot be completed at this time. Will forward to Ventura Sellers, RN, [facility] to verify pt is still to be taking Dofetilide.

## 2022-10-06 NOTE — Telephone Encounter (Signed)
Rx sent to his pharmacy

## 2022-10-07 ENCOUNTER — Telehealth: Payer: Self-pay | Admitting: Internal Medicine

## 2022-10-07 NOTE — Telephone Encounter (Signed)
Patient is returning call.  °

## 2022-10-07 NOTE — Telephone Encounter (Signed)
Pt c/o medication issue:  1. Name of Medication: Diltiazem and Dofetilide  2. How are you currently taking this medication (dosage and times per day)?   3. Are you having a reaction (difficulty breathing--STAT)?   4. What is your medication issue? Patient said he was not aware that these medicine had been discontinued

## 2022-10-07 NOTE — Telephone Encounter (Addendum)
Attempted to call patient at both home and mobile number listed. Neither number will go through and unable to leave a voicemail.   See refill documentation below: Julaine Fusi   10/06/22  4:07 PM Attempted phone call to pt to verify he is still taking Dofetilide as it looks like medication was discontinued on 07/18/2022 by Dr Dairl Ponder.    Home phone message states - will not accept call from this number.  Attempted phone call to mobile number.  Message states call cannot be completed at this time. Will forward to Ventura Sellers, RN, [facility] to verify pt is still to be taking Dofetilide.             Eden Lathe, [place]   10/06/22  2:37 PM RX request for Tikosyn requested however the medication is not on his med list, please advise on refill. Thank you!

## 2022-10-07 NOTE — Telephone Encounter (Signed)
Attempted to call patient at both numbers listed, again no answer and unable to leave voicemail.

## 2022-10-08 ENCOUNTER — Ambulatory Visit: Payer: Medicare Other | Attending: Internal Medicine

## 2022-10-08 VITALS — BP 124/62 | HR 63 | Ht 75.0 in | Wt 245.5 lb

## 2022-10-08 DIAGNOSIS — Z79899 Other long term (current) drug therapy: Secondary | ICD-10-CM | POA: Insufficient documentation

## 2022-10-08 DIAGNOSIS — I480 Paroxysmal atrial fibrillation: Secondary | ICD-10-CM | POA: Diagnosis not present

## 2022-10-08 MED ORDER — DILTIAZEM HCL ER COATED BEADS 180 MG PO CP24: 180.0000 mg | ORAL_CAPSULE | Freq: Every day | ORAL | 0 refills | Status: DC

## 2022-10-08 MED ORDER — DOFETILIDE 500 MCG PO CAPS: 500.0000 ug | ORAL_CAPSULE | Freq: Two times a day (BID) | ORAL | 0 refills | Status: DC

## 2022-10-08 NOTE — Telephone Encounter (Signed)
Called to check with pharmacy that dofetilide script was accepted Pharmacy tech states that dofetilide has been picked up and 90 day script of diltiazem was filled in June.

## 2022-10-08 NOTE — Progress Notes (Signed)
   Nurse Visit   Date of Encounter: 10/08/2022 ID: [name], DOB 01-May-1945, MRN [id number]  PCP:  Sandford Craze, NP   [facility] Providers Cardiologist:  Donato Schultz, MD Electrophysiologist:  Sherryl Manges, MD      Visit Details   VS:  HR 63, BP 124/62, BMI 245.5 lb, HT 6'3"  Wt Readings from Last 3 Encounters:  07/18/22 243 lb (110.2 kg)  07/01/22 244 lb (110.7 kg)  06/26/22 243 lb (110.2 kg)     Reason for visit: Dofetilide and cardizem refill Performed today: Vitals, EKG, Provider consulted:Chandrasekhar, and Education Changes (medications, testing, etc.) : Refill Dofetilide and diltiazem. Length of Visit: 45 minutes    Medications Adjustments/Labs and Tests Ordered: Patient presenting as he is unable to get refills of dofetilide and diltiazem. It appears these meds were accidentally stopped after carpal tunnel surgery in May. He ran out yesterday morning, missed yesterday evening's dose of dofetilide and has taken neither cardizem or dofetilide today.  EKG performed and reviewed by Dr. Izora Ribas. Refills ordered, follow up appt scheduled for August 6 at 9 AM with [facility].     Signed, Luellen Pucker, RN  10/08/2022 10:40 AM

## 2022-10-08 NOTE — Telephone Encounter (Signed)
Please see encounter from today 10/08/2022.  No further action needed at this time.

## 2022-10-15 ENCOUNTER — Other Ambulatory Visit: Payer: Self-pay | Admitting: Internal Medicine

## 2022-10-20 ENCOUNTER — Ambulatory Visit: Payer: Medicare Other | Admitting: Pulmonary Disease

## 2022-10-21 ENCOUNTER — Ambulatory Visit (HOSPITAL_COMMUNITY): Admission: RE | Admit: 2022-10-21 | Payer: Medicare Other | Source: Ambulatory Visit | Admitting: Physician Assistant

## 2022-10-21 ENCOUNTER — Encounter (HOSPITAL_COMMUNITY): Payer: Self-pay | Admitting: Physician Assistant

## 2022-10-21 VITALS — BP 118/68 | HR 73 | Ht 75.0 in | Wt 244.2 lb

## 2022-10-21 DIAGNOSIS — Z79899 Other long term (current) drug therapy: Secondary | ICD-10-CM

## 2022-10-21 DIAGNOSIS — I4811 Longstanding persistent atrial fibrillation: Secondary | ICD-10-CM | POA: Diagnosis not present

## 2022-10-21 DIAGNOSIS — I110 Hypertensive heart disease with heart failure: Secondary | ICD-10-CM | POA: Insufficient documentation

## 2022-10-21 DIAGNOSIS — I2510 Atherosclerotic heart disease of native coronary artery without angina pectoris: Secondary | ICD-10-CM | POA: Insufficient documentation

## 2022-10-21 DIAGNOSIS — Z951 Presence of aortocoronary bypass graft: Secondary | ICD-10-CM | POA: Diagnosis not present

## 2022-10-21 DIAGNOSIS — G4733 Obstructive sleep apnea (adult) (pediatric): Secondary | ICD-10-CM | POA: Diagnosis not present

## 2022-10-21 DIAGNOSIS — I5032 Chronic diastolic (congestive) heart failure: Secondary | ICD-10-CM | POA: Insufficient documentation

## 2022-10-21 DIAGNOSIS — Z7901 Long term (current) use of anticoagulants: Secondary | ICD-10-CM | POA: Insufficient documentation

## 2022-10-21 DIAGNOSIS — D6869 Other thrombophilia: Secondary | ICD-10-CM | POA: Diagnosis not present

## 2022-10-21 DIAGNOSIS — Z5181 Encounter for therapeutic drug level monitoring: Secondary | ICD-10-CM

## 2022-10-21 LAB — BASIC METABOLIC PANEL
Anion gap: 10 (ref 5–15)
BUN: 20 mg/dL (ref 8–23)
CO2: 24 mmol/L (ref 22–32)
Calcium: 9.1 mg/dL (ref 8.9–10.3)
Chloride: 103 mmol/L (ref 98–111)
Creatinine, Ser: 0.99 mg/dL (ref 0.61–1.24)
GFR, Estimated: >60 mL/min (ref >60)
Glucose, Bld: 134 mg/dL — ABNORMAL HIGH (ref 70–99)
Potassium: 3.9 mmol/L (ref 3.5–5.1)
Sodium: 137 mmol/L (ref 135–145)

## 2022-10-21 LAB — MAGNESIUM: Magnesium: 2 mg/dL (ref 1.7–2.4)

## 2022-10-21 MED ORDER — DILTIAZEM HCL ER COATED BEADS 180 MG PO CP24: 180.0000 mg | ORAL_CAPSULE | Freq: Every day | ORAL | 2 refills | Status: DC

## 2022-10-21 NOTE — Progress Notes (Signed)
Primary Care Physician: Sandford Craze, NP Primary Cardiologist: Dr Anne Fu Primary Electrophysiologist: Dr Graciela Husbands Referring Physician: Dr Precious Haws is a 77 y.o. 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 Patient is on Eliquis for a CHADS2VASC score of 6. He was seen by Dr Graciela Husbands on 06/28/19 and dofetilide was recommended. Patient is s/p dofetilide loading 5/18-5/21/21. He converted with the medication and did not require DCCV. He did have some episodes of bradycardia and his diltiazem was reduced.   On follow up today, patient reports that he has done well with no interim symptoms of afib. He inadvertently ran out of dofetilide and diltiazem and did miss an evening dose, resumed 10/08/22, see nurse visit note. He denies any bleeding issues on anticoagulation.   Today, he denies symptoms of palpitations, chest pain, shortness of breath, orthopnea, PND, lower extremity edema, dizziness, presyncope, syncope, bleeding, or neurologic sequela. The patient is tolerating medications without difficulties and is otherwise without complaint today.    Atrial Fibrillation Risk Factors:  he does have symptoms or diagnosis of sleep apnea. he is not compliant with CPAP therapy.   Atrial Fibrillation Management history:  Previous antiarrhythmic drugs: amiodarone, dofetilide  Previous cardioversions: several, most recently 12/10/18 Previous ablations: none Anticoagulation history: Eliquis   Past Medical History:  Diagnosis Date   Acute encephalopathy 12/12/2015   Acute respiratory failure with hypoxia and hypercapnia (HCC) 12/12/2015   Allergic rhinitis 02/13/2010   Qualifier: Diagnosis of  By: Nena Jordan    Angina at rest 10/22/2015   Arthritis    Ataxia 08/10/2013    Atrial fibrillation (HCC) 11/2018   B12 deficiency 08/11/2013   BACK PAIN, LUMBAR 01/29/2009   Qualifier: Diagnosis of  By: Nena Jordan    BENIGN POSITIONAL VERTIGO 11/23/2009   Qualifier: Diagnosis of  By: Nena Jordan    Chronic diastolic heart failure (HCC) 04/24/2015   Chronic pain    left sided-Kristeins   Chronically elevated hemidiaphragm    right   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/04/2015   s/p CABG 2004; CFX DES 2011   Elevated troponin    Essential hypertension 12/12/2006   Qualifier: Diagnosis of  By: Charlsie Quest RMA, Lucy     GERD 12/12/2006   Qualifier: Diagnosis of  By: Tyrone Apple, Lucy     GOUT 12/12/2006   Qualifier: Diagnosis of  By: Charlsie Quest RMA, Lucy     Hemorrhoids    Hyperlipidemia LDL goal <70 12/12/2006   Qualifier: Diagnosis of  By: Charlsie Quest RMA, Lucy      Hypogonadism male 04/10/2011   Hypothyroidism, acquired 02/15/2013   Insomnia 05/21/2013   Long term current use of anticoagulant therapy 06/24/2011   Myocardial infarction (HCC)    Obstructive sleep apnea-Failed CPAP 06/17/2007   Failed CPAP Using O 2 for sleep    Paroxysmal atrial fibrillation (HCC) 12/06/2016   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  Physical Exam: Vitals:   10/21/22 0900  BP: 118/68  Pulse: 73  Weight: 110.8 kg  Height: 6\' 3"  (1.905 m)    GEN: Well nourished, well developed in no acute distress NECK: No JVD; No carotid bruits CARDIAC: Regular rate and rhythm, no murmurs, rubs, gallops RESPIRATORY:  Clear to  auscultation without rales, wheezing or rhonchi  ABDOMEN: Soft, non-tender, non-distended EXTREMITIES:  No edema; No deformity    Wt Readings from Last 3 Encounters:  10/21/22 110.8 kg  10/08/22 111.4 kg  07/18/22 110.2 kg    EKG today demonstrates  SR, IVCD Vent. rate 73 BPM PR interval 194 ms QRS duration 120 ms QT/QTcB 338/372 ms  Echo 02/18/18 demonstrated  - Left ventricle: The cavity size was normal. There was mild focal    basal hypertrophy of the septum. Systolic function was normal.    The estimated ejection fraction was in the range of 55% to 60%.    Wall motion was normal; there were no regional wall motion    abnormalities. Doppler parameters are consistent with abnormal    left ventricular relaxation (grade 1 diastolic dysfunction).    Doppler parameters are consistent with elevated ventricular    end-diastolic filling pressure.  - Mitral valve: Calcified annulus. Mildly thickened leaflets .    There was mild regurgitation.  - Left atrium: The atrium was mildly dilated.  - Right ventricle: The cavity size was mildly dilated. Wall    thickness was normal. Systolic function was mildly reduced.  - Right atrium: The atrium was normal in size.  - Tricuspid valve: There was mild regurgitation.  - Pulmonary arteries: Systolic pressure was within the normal    range.  - Inferior vena cava: The vessel was normal in size.  - Pericardium, extracardiac: There was no pericardial effusion.   Epic records are reviewed at length today  CHA2DS2-VASc Score = 8  The patient's score is based upon: CHF History: 1 HTN History: 1 Diabetes History: 1 Stroke History: 2 Vascular Disease History: 1 Age Score: 2 Gender Score: 0        ASSESSMENT AND PLAN: Longstanding Persistent Atrial Fibrillation (ICD10:  I48.11) The patient's CHA2DS2-VASc score is 8, indicating a 10.8% annual risk of stroke.   S/p dofetilide loading 5/18-5/21/21 Patient appears to be maintaining SR.   Continue dofetilide 500 mcg BID, QT stable Check bmet/mag  Continue Eliquis 5 mg BID  Continue diltiazem 180 mg BID Continue Toprol 25 mg daily  Secondary Hypercoagulable State (ICD10:  D68.69) The patient is at significant risk for stroke/thromboembolism based upon his CHA2DS2-VASc Score of 8.  Continue Apixaban (Eliquis).   OSA  Patient diagnosed remotely and did not tolerate CPAP therapy.  CAD S/p CABG No anginal symptoms  HTN Stable on current regimen  Chronic HFpEF Fluid status appears stable today   Follow up in the AF clinic in 6 months.    Jorja Loa PA-C [facility] [address] [place], [place] [address] [phone] 10/21/2022 9:39 AM

## 2022-10-22 ENCOUNTER — Telehealth: Payer: Self-pay

## 2022-10-22 DIAGNOSIS — I10 Essential (primary) hypertension: Secondary | ICD-10-CM

## 2022-10-22 NOTE — Telephone Encounter (Signed)
Patient called and he was upset about a rx for lisinopril he has and he does "not want to take a different bp medication".  He will like to stay on Benazepril 10 mg wit a 5 mg for 15mg  daily.  I explained to patient back in Feb pharmacy did not have the 5 mg (on back order) and he was given Lisinorpil instead. He does not want to do this and will like his 5 mg be sent to his pharmacy.  Per pharmacy they do have this at the moment.  Please advise, not sure if he is due for follow up

## 2022-10-23 DIAGNOSIS — H33101 Unspecified retinoschisis, right eye: Secondary | ICD-10-CM | POA: Diagnosis not present

## 2022-10-23 DIAGNOSIS — E119 Type 2 diabetes mellitus without complications: Secondary | ICD-10-CM | POA: Diagnosis not present

## 2022-10-23 DIAGNOSIS — H35373 Puckering of macula, bilateral: Secondary | ICD-10-CM | POA: Diagnosis not present

## 2022-10-23 DIAGNOSIS — H43813 Vitreous degeneration, bilateral: Secondary | ICD-10-CM | POA: Diagnosis not present

## 2022-10-23 MED ORDER — BENAZEPRIL HCL 5 MG PO TABS
5.0000 mg | ORAL_TABLET | Freq: Every day | ORAL | 1 refills | Status: DC
Start: 2022-10-23 — End: 2023-03-16

## 2022-10-23 NOTE — Telephone Encounter (Signed)
Benazapril 10mg  was sent to his pharmacy on 7/2 for 90 tabs with 1 refill. I would like to see him back for follow up in September please.

## 2022-10-23 NOTE — Telephone Encounter (Signed)
Called patient several times at home and cell but no answer and no voice mail.  He needs to be advised benazepril 5 mg also sent (ok per provider) and scheduled to return in September.

## 2022-10-24 NOTE — Telephone Encounter (Signed)
Called patient again but no answer. MyChart message sent with this information

## 2022-10-30 ENCOUNTER — Encounter: Payer: Self-pay | Admitting: Physical Medicine & Rehabilitation

## 2022-10-30 ENCOUNTER — Encounter (INDEPENDENT_AMBULATORY_CARE_PROVIDER_SITE_OTHER): Payer: Self-pay

## 2022-10-30 ENCOUNTER — Ambulatory Visit
Admission: RE | Admit: 2022-10-30 | Discharge: 2022-10-30 | Disposition: A | Discharge status: Home / Self Care | Payer: Medicare Other | Source: Ambulatory Visit | Attending: Physical Medicine & Rehabilitation | Admitting: Physical Medicine & Rehabilitation

## 2022-10-30 ENCOUNTER — Encounter: Payer: Medicare Other | Attending: Physical Medicine & Rehabilitation | Admitting: Physical Medicine & Rehabilitation

## 2022-10-30 VITALS — BP 116/71 | HR 65 | Ht 75.0 in | Wt 244.0 lb

## 2022-10-30 DIAGNOSIS — G8114 Spastic hemiplegia affecting left nondominant side: Secondary | ICD-10-CM

## 2022-10-30 DIAGNOSIS — I69354 Hemiplegia and hemiparesis following cerebral infarction affecting left non-dominant side: Secondary | ICD-10-CM | POA: Insufficient documentation

## 2022-10-30 DIAGNOSIS — G8929 Other chronic pain: Secondary | ICD-10-CM | POA: Insufficient documentation

## 2022-10-30 DIAGNOSIS — M545 Low back pain, unspecified: Secondary | ICD-10-CM | POA: Insufficient documentation

## 2022-10-30 DIAGNOSIS — G8110 Spastic hemiplegia affecting unspecified side: Secondary | ICD-10-CM | POA: Diagnosis not present

## 2022-10-30 MED ORDER — ONABOTULINUMTOXINA 100 UNITS IJ SOLR
300.0000 [IU] | Freq: Once | INTRAMUSCULAR | Status: AC
Start: 2022-10-30 — End: 2022-10-30
  Administered 2022-10-30: 300 [IU] via INTRAMUSCULAR

## 2022-10-30 MED ORDER — KETOROLAC TROMETHAMINE 60 MG/2ML IM SOLN
60.0000 mg | Freq: Once | INTRAMUSCULAR | Status: AC
Start: 2022-10-30 — End: 2022-10-30
  Administered 2022-10-30: 60 mg via INTRAMUSCULAR

## 2022-10-30 NOTE — Addendum Note (Signed)
Addended by: Silas Sacramento T on: 10/30/2022 03:13 PM   Modules accepted: Orders

## 2022-10-30 NOTE — Progress Notes (Signed)
Botox Injection for spasticity using needle EMG guidance  Dilution: 50 Units/ml Indication: Severe spasticity which interferes with ADL,mobility and/or  hygiene and is unresponsive to medication management and other conservative care Informed consent was obtained after describing risks and benefits of the procedure with the patient. This includes bleeding, bruising, infection, excessive weakness, or medication side effects. A REMS form is on file and signed. Needle: 27g 1" needle electrode Number of units per muscle LEFT UE Biceps50  LEFT LE  Medial Hamstrings250 All injections were done after obtaining appropriate EMG activity and after negative drawback for blood. The patient tolerated the procedure well. Post procedure instructions were given. A followup appointment was made.      Patient had a fall at home a week and a half ago has some tenderness in his low back area continued pain. He requests a shot for this. He has had no weakness no loss of bowel or bladder function Exam There is no point tenderness along the lumbar spinous processes.  Mild tenderness over the upper part of the sacrum.  No tenderness along the lumbar paraspinal area. He ambulates with a walker no evidence of toe drag and instability Impression acute exacerbation of chronic low back pain Given that it has been going on for a week and a half we will check some x-rays given his age and history of trauma. Will give shot of Toradol 60mg  IM today.  Return to clinic in 4 weeks

## 2022-11-05 ENCOUNTER — Telehealth: Payer: Self-pay

## 2022-11-05 DIAGNOSIS — M545 Low back pain, unspecified: Secondary | ICD-10-CM

## 2022-11-05 NOTE — Telephone Encounter (Signed)
[name] called for his xray results. He stated his back pain is currently at a level 5 out of 10.  Call back  phone [phone].

## 2022-11-06 NOTE — Telephone Encounter (Signed)
Per Mrs. Chilcoat he does not want to do physical therapy. I advised her to have him call back.  If he has other options, he wants to try.

## 2022-11-06 NOTE — Addendum Note (Signed)
Addended by: [name] on: 11/06/2022 09:35 AM   Modules accepted: Orders

## 2022-11-10 NOTE — Telephone Encounter (Signed)
Please check to see if Mr. Semmler can get approval for injections.

## 2022-11-11 NOTE — Telephone Encounter (Signed)
Patient has Medicare A/B so NO PA required.

## 2022-11-16 ENCOUNTER — Other Ambulatory Visit: Payer: Self-pay | Admitting: Family

## 2022-11-18 DIAGNOSIS — M545 Low back pain, unspecified: Secondary | ICD-10-CM | POA: Diagnosis not present

## 2022-11-19 DIAGNOSIS — I739 Peripheral vascular disease, unspecified: Secondary | ICD-10-CM | POA: Diagnosis not present

## 2022-11-19 DIAGNOSIS — E1142 Type 2 diabetes mellitus with diabetic polyneuropathy: Secondary | ICD-10-CM | POA: Diagnosis not present

## 2022-11-19 DIAGNOSIS — L84 Corns and callosities: Secondary | ICD-10-CM | POA: Diagnosis not present

## 2022-11-19 DIAGNOSIS — M79672 Pain in left foot: Secondary | ICD-10-CM | POA: Diagnosis not present

## 2022-11-19 DIAGNOSIS — E1151 Type 2 diabetes mellitus with diabetic peripheral angiopathy without gangrene: Secondary | ICD-10-CM | POA: Diagnosis not present

## 2022-11-19 DIAGNOSIS — M79671 Pain in right foot: Secondary | ICD-10-CM | POA: Diagnosis not present

## 2022-11-19 DIAGNOSIS — L603 Nail dystrophy: Secondary | ICD-10-CM | POA: Diagnosis not present

## 2022-11-24 DIAGNOSIS — M545 Low back pain, unspecified: Secondary | ICD-10-CM | POA: Diagnosis not present

## 2022-11-26 ENCOUNTER — Ambulatory Visit (INDEPENDENT_AMBULATORY_CARE_PROVIDER_SITE_OTHER): Payer: Medicare Other | Admitting: Family

## 2022-11-26 ENCOUNTER — Encounter: Payer: Self-pay | Admitting: Family

## 2022-11-26 ENCOUNTER — Telehealth: Payer: Self-pay | Admitting: Family

## 2022-11-26 VITALS — BP 130/73 | HR 66 | Temp 98.4°F | Resp 16 | Ht 75.0 in | Wt 250.0 lb

## 2022-11-26 DIAGNOSIS — E7800 Pure hypercholesterolemia, unspecified: Secondary | ICD-10-CM

## 2022-11-26 DIAGNOSIS — I4891 Unspecified atrial fibrillation: Secondary | ICD-10-CM | POA: Diagnosis not present

## 2022-11-26 DIAGNOSIS — I10 Essential (primary) hypertension: Secondary | ICD-10-CM

## 2022-11-26 DIAGNOSIS — E039 Hypothyroidism, unspecified: Secondary | ICD-10-CM

## 2022-11-26 DIAGNOSIS — M545 Low back pain, unspecified: Secondary | ICD-10-CM | POA: Diagnosis not present

## 2022-11-26 DIAGNOSIS — E538 Deficiency of other specified B group vitamins: Secondary | ICD-10-CM | POA: Diagnosis not present

## 2022-11-26 DIAGNOSIS — T148XXA Other injury of unspecified body region, initial encounter: Secondary | ICD-10-CM | POA: Diagnosis not present

## 2022-11-26 DIAGNOSIS — Z8639 Personal history of other endocrine, nutritional and metabolic disease: Secondary | ICD-10-CM

## 2022-11-26 DIAGNOSIS — G8929 Other chronic pain: Secondary | ICD-10-CM

## 2022-11-26 DIAGNOSIS — Z23 Encounter for immunization: Secondary | ICD-10-CM

## 2022-11-26 DIAGNOSIS — L219 Seborrheic dermatitis, unspecified: Secondary | ICD-10-CM

## 2022-11-26 DIAGNOSIS — Z8739 Personal history of other diseases of the musculoskeletal system and connective tissue: Secondary | ICD-10-CM

## 2022-11-26 LAB — BASIC METABOLIC PANEL
BUN: 19 mg/dL (ref 6–23)
CO2: 32 meq/L (ref 19–32)
Calcium: 9.5 mg/dL (ref 8.4–10.5)
Chloride: 101 meq/L (ref 96–112)
Creatinine, Ser: 1.33 mg/dL (ref 0.40–1.50)
GFR: 50.83 mL/min — ABNORMAL LOW (ref >60.00)
Glucose, Bld: 103 mg/dL — ABNORMAL HIGH (ref 70–99)
Potassium: 4.5 meq/L (ref 3.5–5.1)
Sodium: 141 meq/L (ref 135–145)

## 2022-11-26 LAB — HEMOGLOBIN A1C: Hgb A1c MFr Bld: 6.2 % (ref 4.6–6.5)

## 2022-11-26 LAB — TSH: TSH: 2.34 u[IU]/mL (ref 0.35–5.50)

## 2022-11-26 MED ORDER — APIXABAN 5 MG PO TABS: 5.0000 mg | ORAL_TABLET | Freq: Two times a day (BID) | ORAL | Status: DC

## 2022-11-26 MED ORDER — CEPHALEXIN 500 MG PO CAPS
500.0000 mg | ORAL_CAPSULE | Freq: Three times a day (TID) | ORAL | 0 refills | Status: DC
Start: 2022-11-26 — End: 2023-01-28

## 2022-11-26 MED ORDER — FLUOCINONIDE 0.05 % EX GEL
1.0000 | Freq: Two times a day (BID) | CUTANEOUS | 1 refills | Status: DC | PRN
Start: 2022-11-26 — End: 2023-10-30

## 2022-11-26 NOTE — Assessment & Plan Note (Signed)
Chronic. States he was following with Dr. Wynn Banker. Had an MRI performed recently (I do not have access to these results). However, he states that he will be meeting with a Careers adviser.

## 2022-11-26 NOTE — Assessment & Plan Note (Signed)
  Chronic, non-healing lesion on back. No improvement with topical treatments. -Prescribe oral antibiotic. -Recheck in 2 weeks. If no improvement, refer to dermatology.

## 2022-11-26 NOTE — Assessment & Plan Note (Signed)
Stable,no flares on allopurinol.

## 2022-11-26 NOTE — Assessment & Plan Note (Signed)
  Rapid skin shedding, not improved with dandruff shampoo. -Prescribe steroid gel to apply once daily (twice daily if needed).

## 2022-11-26 NOTE — Assessment & Plan Note (Signed)
It looks like Eliquis fell off his list back in May (was removed by the Hydrographic surveyor).  Will confirm that he is taking and add back to his list.  Rate stable on Tikosyn. Continues to follow with cardiology (Dr. Anne Fu).

## 2022-11-26 NOTE — Assessment & Plan Note (Signed)
Lab Results  Component Value Date   HGBA1C 6.1 05/27/2022   HGBA1C 6.0 01/21/2022   HGBA1C 5.9 (H) 08/28/2021   Lab Results  Component Value Date   MICROALBUR 0.7 05/27/2022   LDLCALC 75 05/27/2022   CREATININE 0.99 10/21/2022   At goal, continue DM diet

## 2022-11-26 NOTE — Assessment & Plan Note (Signed)
BP at goal, continue benazepril 15mg  and diltiacem CD 180mg .

## 2022-11-26 NOTE — Patient Instructions (Signed)
VISIT SUMMARY:  During your visit, we discussed your back pain, which has worsened after a fall three weeks ago. We also talked about your scalp condition that is shedding skin rapidly and a persistent skin lesion on the back of your neck. Your diabetes is well controlled with diet, and your thyroid levels are normal. Your blood pressure is well managed with medication, and your cholesterol is at the desired level with Lipitor 40. However, your triglycerides were slightly elevated at the last check.  YOUR PLAN:  -BACK PAIN: Your back pain has worsened after a recent fall. You are currently under the care of Dr. Colon Branch at [facility]. Please continue with the current management plan under Dr. Colon Branch.  -HYPOTHYROIDISM: Your thyroid levels are stable with the help of Synthroid. Please continue taking Synthroid and we will check your thyroid function tests today.  -HYPERTENSION: Your blood pressure is well controlled with Diltiazem and Benazepril. Please continue taking these medications.  -HYPERLIPIDEMIA: Your cholesterol is at the desired level with Lipitor 40mg . Please continue taking Lipitor 40mg .  -DIABETES MELLITUS: Your diabetes is well controlled with diet. Please continue with your diet control.  -GOUT: You have not had recent issues with gout. Please continue with your current management.  -SKIN WOUND: You have a chronic, non-healing wound on your back. We will prescribe an oral antibiotic and recheck in 2 weeks. If there is no improvement, we will refer you to dermatology.  -SCALP DERMATITIS: Your scalp is shedding skin rapidly. We will prescribe a steroid gel to apply once daily (twice daily if needed). Continue using a dandruff shampoo such as Selsun Blue.  -GENERAL HEALTH MAINTENANCE: We will administer the influenza vaccine today. We also recommend getting a COVID booster at the pharmacy when it becomes available.  INSTRUCTIONS:  Please continue with your current  medications and management plans as discussed. We will check your thyroid function tests today. We will also administer the influenza vaccine today. Please apply the prescribed steroid gel to your scalp once daily (twice daily if needed). We will prescribe an oral antibiotic for your skin lesion and recheck in 2 weeks. If there is no improvement, we will refer you to dermatology. We also recommend getting a COVID booster at the pharmacy when it becomes available.

## 2022-11-26 NOTE — Progress Notes (Signed)
Subjective:     Patient ID: [name], male    DOB: 1946-02-24, 77 y.o.   MRN: [id number]  Chief Complaint  Patient presents with   Diabetes    Here for follow up   Wound Check    Patient complains of open wound on his back    HPI  Discussed the use of AI scribe software for clinical note transcription with the patient, who gave verbal consent to proceed.  History of Present Illness   The patient presents with worsening back pain following a fall three weeks ago.  The patient also reports a skin condition on the scalp that is rapidly shedding skin. He has tried various treatments, including dandruff shampoo, but the condition persists.  Additionally, the patient has a persistent skin lesion on the back that has not healed despite various at home treatments and bandages. He has not tried antibiotics yet.  The patient's diabetes is well controlled with diet alone, with a last A1c in March of 6.1. He has not had recent issues with gout. He is on Synthroid for thyroid management, and his thyroid levels were normal at last check. He is also on diltiazem and benazepril for blood pressure management, which is well controlled. His cholesterol is at goal on Lipitor 40, though his triglycerides were slightly elevated at last check.         Health Maintenance Due  Topic Date Due   OPHTHALMOLOGY EXAM  10/16/2014   Medicare Annual Wellness (AWV)  05/21/2017   COVID-19 Vaccine (3 - Pfizer risk series) 06/29/2019    Past Medical History:  Diagnosis Date   Acute encephalopathy 12/12/2015   Acute respiratory failure with hypoxia and hypercapnia (HCC) 12/12/2015   Allergic rhinitis 02/13/2010   Qualifier: Diagnosis of  By: Nena Jordan    Angina at rest 10/22/2015   Arthritis    Ataxia 08/10/2013   Atrial fibrillation (HCC) 11/2018   B12 deficiency 08/11/2013   BACK PAIN, LUMBAR 01/29/2009   Qualifier: Diagnosis of  By: Nena Jordan    BENIGN POSITIONAL VERTIGO  11/23/2009   Qualifier: Diagnosis of  By: Nena Jordan    Chronic diastolic heart failure (HCC) 04/24/2015   Chronic pain    left sided-Kristeins   Chronically elevated hemidiaphragm    right   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/04/2015   s/p CABG 2004; CFX DES 2011   Elevated troponin    Essential hypertension 12/12/2006   Qualifier: Diagnosis of  By: Charlsie Quest RMA, Lucy     GERD 12/12/2006   Qualifier: Diagnosis of  By: Tyrone Apple, Lucy     GOUT 12/12/2006   Qualifier: Diagnosis of  By: Charlsie Quest RMA, Lucy     Hemorrhoids    Hyperlipidemia LDL goal <70 12/12/2006   Qualifier: Diagnosis of  By: Charlsie Quest RMA, Lucy      Hypogonadism male 04/10/2011   Hypothyroidism, acquired 02/15/2013   Insomnia 05/21/2013   Long term current use of anticoagulant therapy 06/24/2011   Myocardial infarction (HCC)    Obstructive sleep apnea-Failed CPAP 06/17/2007   Failed CPAP Using O 2 for sleep    Paroxysmal atrial fibrillation (HCC) 12/06/2016   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; resultant hemiplegia of dominant side   Thoracic aorta atherosclerosis (HCC) 04/27/2017   Tubular adenoma of colon 05/2010   Type 2 diabetes mellitus without complication, without long-term current use of insulin (HCC) 10/05/2007  Qualifier: Diagnosis of  By: Nena Jordan    Vasovagal syncope    Vasovagal syncope    Wears glasses     Past Surgical History:  Procedure Laterality Date   CARDIAC CATHETERIZATION N/A 10/25/2015   Procedure: Left Heart Cath and Cors/Grafts Angiography;  Surgeon: Lennette Bihari, MD;  Location: [facility] INVASIVE CV LAB;  Service: Cardiovascular;  Laterality: N/A;   CARDIOVERSION  06/20/2011   Procedure: CARDIOVERSION;  Surgeon: Laurey Morale, MD;  Location: [facility] ENDOSCOPY;  Service: Cardiovascular;  Laterality: N/A;   CARDIOVERSION N/A 04/26/2015   Procedure: CARDIOVERSION;  Surgeon: Lars Masson, MD;  Location: [facility] ENDOSCOPY;  Service:  Cardiovascular;  Laterality: N/A;   CARDIOVERSION N/A 05/10/2015   Procedure: CARDIOVERSION;  Surgeon: Laurey Morale, MD;  Location: [facility] ENDOSCOPY;  Service: Cardiovascular;  Laterality: N/A;   CARDIOVERSION N/A 12/10/2018   Procedure: CARDIOVERSION;  Surgeon: Thurmon Fair, MD;  Location: [facility] ENDOSCOPY;  Service: Cardiovascular;  Laterality: N/A;   CARPOMETACARPAL (CMC) FUSION OF THUMB Right 07/18/2022   Procedure: right carpo metacarpal suspensionplasty with abductor tendon transfer right wrist stenosing tenosynovitis release, right index carpo metacarpal boss excision;  Surgeon: Dairl Ponder, MD;  Location: [facility] OR;  Service: Orthopedics;  Laterality: Right;   CATARACT EXTRACTION  05/2010   left eye   CATARACT EXTRACTION  04/2010   right eye   COLONOSCOPY WITH PROPOFOL N/A 02/22/2020   Procedure: COLONOSCOPY WITH PROPOFOL;  Surgeon: Shellia Cleverly, DO;  Location: [facility] ENDOSCOPY;  Service: Gastroenterology;  Laterality: N/A;   CORONARY ARTERY BYPASS GRAFT     stent   DORSAL COMPARTMENT RELEASE Right 07/18/2022   Procedure: RELEASE DORSAL COMPARTMENT (DEQUERVAIN);  Surgeon: Dairl Ponder, MD;  Location: [facility] OR;  Service: Orthopedics;  Laterality: Right;  Anethesia Axillary block/ MAC   ESOPHAGOGASTRODUODENOSCOPY  03-25-2005   HEMORRHOID SURGERY N/A 09/05/2021   Procedure: HEMORRHOIDECTOMY WITH LIGATION AND HEMORRHOIDOPEXY;  Surgeon: Karie Soda, MD;  Location: [facility] ORS;  Service: General;  Laterality: N/A;   LEFT HEART CATH AND CORS/GRAFTS ANGIOGRAPHY N/A 02/19/2018   Procedure: LEFT HEART CATH AND CORS/GRAFTS ANGIOGRAPHY;  Surgeon: Swaziland, Peter M, MD;  Location: [facility] INVASIVE CV LAB;  Service: Cardiovascular;  Laterality: N/A;   NASAL SEPTUM SURGERY     PERCUTANEOUS PLACEMENT INTRAVASCULAR STENT CERVICAL CAROTID ARTERY     03-2009; using a drug-eluting platform of the circumflex cornoray artery with a 3.0 x 18 [employer] Promus drug-eluting platform post dilated to 3.75 with a  noncompliant balloon.   POLYPECTOMY  02/22/2020   Procedure: POLYPECTOMY;  Surgeon: Shellia Cleverly, DO;  Location: [facility] ENDOSCOPY;  Service: Gastroenterology;;   RECTAL EXAM UNDER ANESTHESIA N/A 09/05/2021   Procedure: ANORECTAL EXAM UNDER ANESTHESIA;  Surgeon: Karie Soda, MD;  Location: [facility] ORS;  Service: General;  Laterality: N/A;   TEE WITHOUT CARDIOVERSION  06/20/2011   Procedure: TRANSESOPHAGEAL ECHOCARDIOGRAM (TEE);  Surgeon: Laurey Morale, MD;  Location: [facility] ENDOSCOPY;  Service: Cardiovascular;  Laterality: N/A;    Family History  Problem Relation Age of Onset   Lung cancer Father        deceased   Stroke Mother        deceased-MINISTROKES   Colon cancer Neg Hx    Esophageal cancer Neg Hx    Stomach cancer Neg Hx    Pancreatic cancer Neg Hx    Liver disease Neg Hx     Social History   Socioeconomic History   Marital status: Married    Spouse name: [name]  Number of children: Not on file   Years of education: 14   Highest education level: Not on file  Occupational History   Occupation: retired    Associate Professor: OTHER    Comment: Physicist, medical  Tobacco Use   Smoking status: Never   Smokeless tobacco: Never  Vaping Use   Vaping status: Never Used  Substance and Sexual Activity   Alcohol use: Not Currently   Drug use: No   Sexual activity: Not Currently  Other Topics Concern   Not on file  Social History Narrative   Pt lives with wife. Does have stairs, but patient doesn't use them. Pt has completed technical school   Social Determinants of Health   Financial Resource Strain: Not on file  Food Insecurity: Not on file  Transportation Needs: Not on file  Physical Activity: Not on file  Stress: Not on file  Social Connections: Not on file  Intimate Partner Violence: Not on file    Outpatient Medications Prior to Visit  Medication Sig Dispense Refill   allopurinol (ZYLOPRIM) 100 MG tablet TAKE 2 TABLETS BY MOUTH EVERY DAY 180 tablet 1   atorvastatin  (LIPITOR) 40 MG tablet TAKE 1 TABLET BY MOUTH EVERY DAY 90 tablet 1   benazepril (LOTENSIN) 10 MG tablet Take 1 tablet (10 mg total) by mouth daily. 90 tablet 1   benazepril (LOTENSIN) 5 MG tablet Take 1 tablet (5 mg total) by mouth daily. Take with Benazepril 10 mg 90 tablet 1   diltiazem (CARDIZEM CD) 180 MG 24 hr capsule Take 1 capsule (180 mg total) by mouth daily. 90 capsule 2   dofetilide (TIKOSYN) 500 MCG capsule TAKE 1 CAPSULE BY MOUTH 2 TIMES DAILY. 180 capsule 1   gabapentin (NEURONTIN) 100 MG capsule TAKE 1 CAPSULE BY MOUTH EVERYDAY AT BEDTIME 90 capsule 1   levothyroxine (SYNTHROID) 112 MCG tablet Take 1 tablet (112 mcg total) by mouth daily before breakfast. 90 tablet 0   traMADol (ULTRAM) 50 MG tablet TAKE 1 TABLET BY MOUTH TWICE A DAY 180 tablet 1   traMADol (ULTRAM-ER) 200 MG 24 hr tablet TAKE 1 TABLET BY MOUTH EVERY DAY 90 tablet 1   No facility-administered medications prior to visit.    No Known Allergies  ROS     Objective:    Physical Exam Constitutional:      General: He is not in acute distress.    Appearance: He is well-developed.  HENT:     Head: Normocephalic and atraumatic.  Cardiovascular:     Rate and Rhythm: Normal rate and regular rhythm.     Heart sounds: No murmur heard. Pulmonary:     Effort: Pulmonary effort is normal. No respiratory distress.     Breath sounds: Normal breath sounds. No wheezing or rales.  Musculoskeletal:     Right lower leg: 1+ Edema present.     Left lower leg: 2+ Edema present.  Skin:    General: Skin is warm and dry.     Comments: Dry flaking skin noted on scalp Approximately 1 inch wide 2 inch long scab/thin skin noted upper mid back, some surrounding erythema  Neurological:     Mental Status: He is alert and oriented to person, place, and time.  Psychiatric:        Behavior: Behavior normal.        Thought Content: Thought content normal.      BP 130/73 (BP Location: Right Arm, Patient Position: Sitting, Cuff  Size: Small)   Pulse 66  Temp 98.4 F (36.9 C) (Oral)   Resp 16   Ht 6\' 3"  (1.905 m)   Wt 250 lb (113.4 kg)   SpO2 96%   BMI 31.25 kg/m  Wt Readings from Last 3 Encounters:  11/26/22 250 lb (113.4 kg)  10/30/22 244 lb (110.7 kg)  10/21/22 244 lb 3.2 oz (110.8 kg)       Assessment & Plan:   Problem List Items Addressed This Visit       Unprioritized   Seborrheic dermatitis     Rapid skin shedding, not improved with dandruff shampoo. -Prescribe steroid gel to apply once daily (twice daily if needed).      Relevant Medications   fluocinonide gel (LIDEX) 0.05 %   Open wound of skin     Chronic, non-healing lesion on back. No improvement with topical treatments. -Prescribe oral antibiotic. -Recheck in 2 weeks. If no improvement, refer to dermatology.      Relevant Medications   cephALEXin (KEFLEX) 500 MG capsule   Hypothyroidism    Lab Results  Component Value Date   TSH 2.49 05/27/2022   Clinically stable on synthroid 112 mcg.      Relevant Orders   TSH   Hyperlipidemia    Lab Results  Component Value Date   CHOL 156 05/27/2022   HDL 43.20 05/27/2022   LDLCALC 75 05/27/2022   TRIG 186.0 (H) 05/27/2022   CHOLHDL 4 05/27/2022   LDL at goal on lipitor 40 mg. Continue same.       Relevant Medications   apixaban (ELIQUIS) 5 MG TABS tablet   History of type 2 diabetes mellitus - Primary    Lab Results  Component Value Date   HGBA1C 6.1 05/27/2022   HGBA1C 6.0 01/21/2022   HGBA1C 5.9 (H) 08/28/2021   Lab Results  Component Value Date   MICROALBUR 0.7 05/27/2022   LDLCALC 75 05/27/2022   CREATININE 0.99 10/21/2022   At goal, continue DM diet      Relevant Orders   HgB A1c   Basic Metabolic Panel (BMET)   History of hypothyroidism     Stable on Synthroid. -Continue Synthroid. -Check thyroid function tests today.       History of gout    Stable,no flares on allopurinol.      Essential hypertension    BP at goal, continue benazepril  15mg  and diltiacem CD 180mg .       Relevant Medications   apixaban (ELIQUIS) 5 MG TABS tablet   Back pain    Uncontrolled- he states he has an upcoming appointment with the surgeon for consultation.       B12 deficiency    Stable on oral supplementation, continue same.       Atrial fibrillation [facility])    It looks like Eliquis fell off his list back in May (was removed by the hand surgeon).  Will confirm that he is taking and add back to his list.  Rate stable on Tikosyn. Continues to follow with cardiology (Dr. Anne Fu).       Relevant Medications   apixaban (ELIQUIS) 5 MG TABS tablet   Other Visit Diagnoses     Needs flu shot       Relevant Orders   Flu Vaccine Trivalent High Dose (Fluad) (Completed)       I am having [name] start on cephALEXin, fluocinonide gel, and apixaban. I am also having him maintain his traMADol, traMADol, benazepril, atorvastatin, gabapentin, levothyroxine, dofetilide, diltiazem, benazepril, and allopurinol.  Meds ordered this encounter  Medications   cephALEXin (KEFLEX) 500 MG capsule    Sig: Take 1 capsule (500 mg total) by mouth 3 (three) times daily.    Dispense:  21 capsule    Refill:  0    Order Specific Question:   Supervising Provider    Answer:   Danise Edge A [[id number]]   fluocinonide gel (LIDEX) 0.05 %    Sig: Apply 1 Application topically 2 (two) times daily as needed.    Dispense:  60 g    Refill:  1    Order Specific Question:   Supervising Provider    Answer:   Danise Edge A [[id number]]   apixaban (ELIQUIS) 5 MG TABS tablet    Sig: Take 1 tablet (5 mg total) by mouth 2 (two) times daily.    Order Specific Question:   Supervising Provider    Answer:   Danise Edge A [[id number]]

## 2022-11-26 NOTE — Assessment & Plan Note (Signed)
Stable on oral supplementation, continue same.

## 2022-11-26 NOTE — Telephone Encounter (Signed)
Electronic request sent 

## 2022-11-26 NOTE — Assessment & Plan Note (Signed)
>>  ASSESSMENT AND PLAN FOR ATRIAL FIBRILLATION (HCC) WRITTEN ON 11/26/2022 10:32 AM BY O'SULLIVAN, Nikol Lemar, NP  It looks like Eliquis  fell off his list back in May (was removed by the hand surgeon).  Will confirm that he is taking and add back to his list.  Rate stable on Tikosyn . Continues to follow with cardiology (Dr. Jeffrie).

## 2022-11-26 NOTE — Telephone Encounter (Signed)
Please request DM eye exam from Dr. Fawn Kirk.

## 2022-11-26 NOTE — Telephone Encounter (Signed)
I noticed that Eliquis was removed from his medication list when he had surgery. I want to make sure that he is still taking this medication for his A Fib.

## 2022-11-26 NOTE — Assessment & Plan Note (Signed)
Uncontrolled- he states he has an upcoming appointment with the surgeon for consultation.

## 2022-11-26 NOTE — Assessment & Plan Note (Addendum)
Lab Results  Component Value Date   CHOL 156 05/27/2022   HDL 43.20 05/27/2022   LDLCALC 75 05/27/2022   TRIG 186.0 (H) 05/27/2022   CHOLHDL 4 05/27/2022   LDL at goal on lipitor 40 mg. Continue same.

## 2022-11-26 NOTE — Assessment & Plan Note (Signed)
Lab Results  Component Value Date   TSH 2.49 05/27/2022   Clinically stable on synthroid 112 mcg.

## 2022-11-26 NOTE — Assessment & Plan Note (Signed)
  Stable on Synthroid. -Continue Synthroid. -Check thyroid function tests today.

## 2022-11-27 NOTE — Progress Notes (Signed)
Mailed out to patient 

## 2022-12-02 ENCOUNTER — Encounter: Payer: Self-pay | Admitting: Physical Medicine & Rehabilitation

## 2022-12-02 ENCOUNTER — Encounter: Payer: Medicare Other | Attending: Physical Medicine & Rehabilitation | Admitting: Physical Medicine & Rehabilitation

## 2022-12-02 VITALS — BP 139/79 | HR 70 | Ht 75.0 in | Wt 248.0 lb

## 2022-12-02 DIAGNOSIS — G8110 Spastic hemiplegia affecting unspecified side: Secondary | ICD-10-CM | POA: Insufficient documentation

## 2022-12-02 DIAGNOSIS — M48062 Spinal stenosis, lumbar region with neurogenic claudication: Secondary | ICD-10-CM | POA: Diagnosis not present

## 2022-12-02 NOTE — Progress Notes (Signed)
Subjective:    Patient ID: [name], male    DOB: 1945-08-27, 77 y.o.   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  He is on Eliquis.  More recently he has been complaining of increased low back pain.  He does have a history of lumbar spinal stenosis last MRI was in 2017 at least in the epic system.  Because of increasing pain x-rays of the lumbar spine were obtained.  Lumbar x-rays demonstrated moderately severe lumbar facet arthropathy affecting L2-S1.  No vertebral compression fracture noted. Patient did undergo botulinum toxin injection for his spastic and please on the left side biceps as well as left hamstring muscles were treated.  He has doing okay with that. He does have chronic left lower extremity pain.  We discussed that prior MRI of the lumbar spine did demonstrate at least moderate stenosis at L3-4 which could lead to some of his symptoms.  We discussed that he also has complicating feature of left hemiplegia and potentially stroke related or spasticity related pain.  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 Seen by Ortho PA at [facility] -    LUMBAR SPINE - 3 VIEW   COMPARISON:  12/05/2015   FINDINGS: No fracture, dislocation or subluxation. No spondylolisthesis. No osteolytic or osteoblastic changes. There is calcification of the abdominal aorta.   Degenerative disc disease noted with disc space narrowing and marginal osteophytes at at each lumbar level. Facet joint degenerative changes identified from the L2-3 through L5-S1 levels.   IMPRESSION: Extensive multilevel degenerative changes. No acute osseous abnormalities.     Electronically Signed   By: Layla Maw M.D.   On: 11/07/2022 16:59 Pain Inventory Average Pain 5 Pain Right Now 5 My pain is constant  In the last 24 hours, has pain interfered with  the following? General activity 4 Relation with others 6 Enjoyment of life 7 What TIME of day is your pain at its worst? morning  Sleep (in general) Fair  Pain is worse with: bending Pain improves with: medication Relief from Meds: 5  Family History  Problem Relation Age of Onset   Lung cancer Father        deceased   Stroke Mother        deceased-MINISTROKES   Colon cancer Neg Hx    Esophageal cancer Neg Hx    Stomach cancer Neg Hx    Pancreatic cancer Neg Hx    Liver disease Neg Hx    Social History   Socioeconomic History   Marital status: Married    Spouse name: [name]   Number of children: Not on file   Years of education: 14   Highest education level: Not on file  Occupational History   Occupation: retired    Associate Professor: OTHER    Comment: Physicist, medical  Tobacco Use   Smoking status: Never   Smokeless tobacco: Never  Vaping Use   Vaping status: Never Used  Substance and Sexual Activity   Alcohol use: Not Currently   Drug use: No   Sexual activity: Not Currently  Other Topics Concern   Not on file  Social History Narrative   Pt lives with wife. Does have stairs, but patient doesn't use them. Pt has completed technical school   Social Determinants of Health   Financial Resource Strain: Not on file  Food Insecurity: Not on file  Transportation Needs: Not on file  Physical Activity: Not on file  Stress: Not on file  Social Connections: Not on file   Past Surgical History:  Procedure Laterality Date   CARDIAC CATHETERIZATION N/A 10/25/2015   Procedure: Left Heart Cath and Cors/Grafts Angiography;  Surgeon: Lennette Bihari, MD;  Location: [facility] INVASIVE CV LAB;  Service: Cardiovascular;  Laterality: N/A;   CARDIOVERSION  06/20/2011   Procedure: CARDIOVERSION;  Surgeon: Laurey Morale, MD;  Location: [facility] ENDOSCOPY;  Service: Cardiovascular;  Laterality: N/A;   CARDIOVERSION N/A 04/26/2015   Procedure: CARDIOVERSION;  Surgeon: Lars Masson, MD;  Location: [facility]  ENDOSCOPY;  Service: Cardiovascular;  Laterality: N/A;   CARDIOVERSION N/A 05/10/2015   Procedure: CARDIOVERSION;  Surgeon: Laurey Morale, MD;  Location: [facility] ENDOSCOPY;  Service: Cardiovascular;  Laterality: N/A;   CARDIOVERSION N/A 12/10/2018   Procedure: CARDIOVERSION;  Surgeon: Thurmon Fair, MD;  Location: [facility] ENDOSCOPY;  Service: Cardiovascular;  Laterality: N/A;   CARPOMETACARPAL (CMC) FUSION OF THUMB Right 07/18/2022   Procedure: right carpo metacarpal suspensionplasty with abductor tendon transfer right wrist stenosing tenosynovitis release, right index carpo metacarpal boss excision;  Surgeon: Dairl Ponder, MD;  Location: [facility] OR;  Service: Orthopedics;  Laterality: Right;   CATARACT EXTRACTION  05/2010   left eye   CATARACT EXTRACTION  04/2010   right eye   COLONOSCOPY WITH PROPOFOL N/A 02/22/2020   Procedure: COLONOSCOPY WITH PROPOFOL;  Surgeon: Shellia Cleverly, DO;  Location: [facility] ENDOSCOPY;  Service: Gastroenterology;  Laterality: N/A;   CORONARY ARTERY BYPASS GRAFT     stent   DORSAL COMPARTMENT RELEASE Right 07/18/2022   Procedure: RELEASE DORSAL COMPARTMENT (DEQUERVAIN);  Surgeon: Dairl Ponder, MD;  Location: [facility] OR;  Service: Orthopedics;  Laterality: Right;  Anethesia Axillary block/ MAC   ESOPHAGOGASTRODUODENOSCOPY  03-25-2005   HEMORRHOID SURGERY N/A 09/05/2021   Procedure: HEMORRHOIDECTOMY WITH LIGATION AND HEMORRHOIDOPEXY;  Surgeon: Karie Soda, MD;  Location: [facility] ORS;  Service: General;  Laterality: N/A;   LEFT HEART CATH AND CORS/GRAFTS ANGIOGRAPHY N/A 02/19/2018   Procedure: LEFT HEART CATH AND CORS/GRAFTS ANGIOGRAPHY;  Surgeon: Swaziland, Peter M, MD;  Location: [facility] INVASIVE CV LAB;  Service: Cardiovascular;  Laterality: N/A;   NASAL SEPTUM SURGERY     PERCUTANEOUS PLACEMENT INTRAVASCULAR STENT CERVICAL CAROTID ARTERY     03-2009; using a drug-eluting platform of the circumflex cornoray artery with a 3.0 x 18 [employer] Promus drug-eluting platform post dilated to  3.75 with a noncompliant balloon.   POLYPECTOMY  02/22/2020   Procedure: POLYPECTOMY;  Surgeon: Shellia Cleverly, DO;  Location: [facility] ENDOSCOPY;  Service: Gastroenterology;;   RECTAL EXAM UNDER ANESTHESIA N/A 09/05/2021   Procedure: ANORECTAL EXAM UNDER ANESTHESIA;  Surgeon: Karie Soda, MD;  Location: [facility] ORS;  Service: General;  Laterality: N/A;   TEE WITHOUT CARDIOVERSION  06/20/2011   Procedure: TRANSESOPHAGEAL ECHOCARDIOGRAM (TEE);  Surgeon: Laurey Morale, MD;  Location: [facility] ENDOSCOPY;  Service: Cardiovascular;  Laterality: N/A;   Past Surgical History:  Procedure Laterality Date   CARDIAC CATHETERIZATION N/A 10/25/2015   Procedure: Left Heart Cath and Cors/Grafts Angiography;  Surgeon: Lennette Bihari, MD;  Location: [facility] INVASIVE CV LAB;  Service: Cardiovascular;  Laterality: N/A;   CARDIOVERSION  06/20/2011   Procedure: CARDIOVERSION;  Surgeon: Laurey Morale, MD;  Location: [facility] ENDOSCOPY;  Service: Cardiovascular;  Laterality: N/A;   CARDIOVERSION N/A 04/26/2015   Procedure: CARDIOVERSION;  Surgeon: Lars Masson, MD;  Location: [facility] ENDOSCOPY;  Service: Cardiovascular;  Laterality: N/A;   CARDIOVERSION  N/A 05/10/2015   Procedure: CARDIOVERSION;  Surgeon: Laurey Morale, MD;  Location: [facility] ENDOSCOPY;  Service: Cardiovascular;  Laterality: N/A;   CARDIOVERSION N/A 12/10/2018   Procedure: CARDIOVERSION;  Surgeon: Thurmon Fair, MD;  Location: [facility] ENDOSCOPY;  Service: Cardiovascular;  Laterality: N/A;   CARPOMETACARPAL (CMC) FUSION OF THUMB Right 07/18/2022   Procedure: right carpo metacarpal suspensionplasty with abductor tendon transfer right wrist stenosing tenosynovitis release, right index carpo metacarpal boss excision;  Surgeon: Dairl Ponder, MD;  Location: [facility] OR;  Service: Orthopedics;  Laterality: Right;   CATARACT EXTRACTION  05/2010   left eye   CATARACT EXTRACTION  04/2010   right eye   COLONOSCOPY WITH PROPOFOL N/A 02/22/2020   Procedure: COLONOSCOPY WITH PROPOFOL;  Surgeon:  Shellia Cleverly, DO;  Location: [facility] ENDOSCOPY;  Service: Gastroenterology;  Laterality: N/A;   CORONARY ARTERY BYPASS GRAFT     stent   DORSAL COMPARTMENT RELEASE Right 07/18/2022   Procedure: RELEASE DORSAL COMPARTMENT (DEQUERVAIN);  Surgeon: Dairl Ponder, MD;  Location: [facility] OR;  Service: Orthopedics;  Laterality: Right;  Anethesia Axillary block/ MAC   ESOPHAGOGASTRODUODENOSCOPY  03-25-2005   HEMORRHOID SURGERY N/A 09/05/2021   Procedure: HEMORRHOIDECTOMY WITH LIGATION AND HEMORRHOIDOPEXY;  Surgeon: Karie Soda, MD;  Location: [facility] ORS;  Service: General;  Laterality: N/A;   LEFT HEART CATH AND CORS/GRAFTS ANGIOGRAPHY N/A 02/19/2018   Procedure: LEFT HEART CATH AND CORS/GRAFTS ANGIOGRAPHY;  Surgeon: Swaziland, Peter M, MD;  Location: [facility] INVASIVE CV LAB;  Service: Cardiovascular;  Laterality: N/A;   NASAL SEPTUM SURGERY     PERCUTANEOUS PLACEMENT INTRAVASCULAR STENT CERVICAL CAROTID ARTERY     03-2009; using a drug-eluting platform of the circumflex cornoray artery with a 3.0 x 18 [employer] Promus drug-eluting platform post dilated to 3.75 with a noncompliant balloon.   POLYPECTOMY  02/22/2020   Procedure: POLYPECTOMY;  Surgeon: Shellia Cleverly, DO;  Location: [facility] ENDOSCOPY;  Service: Gastroenterology;;   RECTAL EXAM UNDER ANESTHESIA N/A 09/05/2021   Procedure: ANORECTAL EXAM UNDER ANESTHESIA;  Surgeon: Karie Soda, MD;  Location: [facility] ORS;  Service: General;  Laterality: N/A;   TEE WITHOUT CARDIOVERSION  06/20/2011   Procedure: TRANSESOPHAGEAL ECHOCARDIOGRAM (TEE);  Surgeon: Laurey Morale, MD;  Location: [facility] ENDOSCOPY;  Service: Cardiovascular;  Laterality: N/A;   Past Medical History:  Diagnosis Date   Acute encephalopathy 12/12/2015   Acute respiratory failure with hypoxia and hypercapnia (HCC) 12/12/2015   Allergic rhinitis 02/13/2010   Qualifier: Diagnosis of  By: Nena Jordan    Angina at rest 10/22/2015   Arthritis    Ataxia 08/10/2013   Atrial fibrillation (HCC) 11/2018    B12 deficiency 08/11/2013   BACK PAIN, LUMBAR 01/29/2009   Qualifier: Diagnosis of  By: Nena Jordan    BENIGN POSITIONAL VERTIGO 11/23/2009   Qualifier: Diagnosis of  By: Nena Jordan    Chronic diastolic heart failure (HCC) 04/24/2015   Chronic pain    left sided-Kristeins   Chronically elevated hemidiaphragm    right   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/04/2015   s/p CABG 2004; CFX DES 2011   Elevated troponin    Essential hypertension 12/12/2006   Qualifier: Diagnosis of  By: Charlsie Quest RMA, Lucy     GERD 12/12/2006   Qualifier: Diagnosis of  By: Tyrone Apple, Lucy     GOUT 12/12/2006   Qualifier: Diagnosis of  By: Charlsie Quest RMA, Lucy     Hemorrhoids    Hyperlipidemia LDL  goal <70 12/12/2006   Qualifier: Diagnosis of  By: Charlsie Quest RMA, Lucy      Hypogonadism male 04/10/2011   Hypothyroidism, acquired 02/15/2013   Insomnia 05/21/2013   Long term current use of anticoagulant therapy 06/24/2011   Myocardial infarction [facility])    Obstructive sleep apnea-Failed CPAP 06/17/2007   Failed CPAP Using O 2 for sleep    Paroxysmal atrial fibrillation (HCC) 12/06/2016   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; resultant hemiplegia of dominant side   Thoracic aorta atherosclerosis (HCC) 04/27/2017   Tubular adenoma of colon 05/2010   Type 2 diabetes mellitus without complication, without long-term current use of insulin (HCC) 10/05/2007   Qualifier: Diagnosis of  By: Nena Jordan    Vasovagal syncope    Vasovagal syncope    Wears glasses    Ht 6\' 3"  (1.905 m)   Wt 248 lb (112.5 kg)   BMI 31.00 kg/m   Opioid Risk Score:   Fall Risk Score:  `1  Depression screen [facility] 2/9     11/26/2022    9:07 AM 07/01/2022    9:43 AM 05/13/2022    9:31 AM 11/01/2021    1:24 PM 04/04/2021    9:20 AM 02/15/2021    9:58 AM 06/26/2020    3:10 PM  Depression screen PHQ 2/9  Decreased Interest 0 0 0  0 0 0  Down, Depressed, Hopeless 0 0 0 0  0 0   PHQ - 2 Score 0 0 0 0 0 0 0  Altered sleeping 0   0     Tired, decreased energy 0        Change in appetite 0        Feeling bad or failure about yourself  0        Trouble concentrating 0        Moving slowly or fidgety/restless 0        Suicidal thoughts 0        PHQ-9 Score 0   0     Difficult doing work/chores Not difficult at all            Review of Systems  Musculoskeletal:  Positive for back pain.       Left arm pain Left hip pain   All other systems reviewed and are negative.     Objective:   Physical Exam Reduced sensation left upper extremity left lower extremity pinprick. Motor strength is 4 - at the left deltoid by stress grip hip flexor knee extensor ankle dorsiflexor Tone MAS 2 with elbow flexor MAS 1 at the finger and wrist flexor MAS 2 at the knee flexor There is no evidence of clonus at the ankle Negative straight leg raise left lower extremity Lumbar range of motion has good lumbar flexion extension is to neutral accompanied by pain in the lower lumbar area Ambulates with a walker mildly increased base of support mild toe drag unchanged       Assessment & Plan:  1.  Left spastic hemiplegia secondary right CVA chronic does well with his botulinum toxin injection will repeat in 6 weeks. 2.  Lumbar spinal stenosis as well as lumbar spondylosis, we discussed that his left lower extremity pain is difficult to evaluate due to neurologic deficits related from stroke.  Certainly there is evidence of at least moderate lumbar spinal stenosis L3-4 seen on his MRI in 2017 which is likely progressed.  Whether the back is the cause of his left  lower extremity pain will be somewhat difficult to determine unless there is some asymmetry in terms of MRI findings.  He did undergo a repeat MRI ordered by his orthopedist.  He will follow-up with them on the results.  We discussed that he may be a candidate for lumbar medial branch blocks for the lumbar axial pain which is his  primary pain complaints.  Lower extremity pain could be addressed with an epidural injection however Eliquis would need to be stopped for that injection. F/u MRI Lumbar spine at [facility]

## 2022-12-03 NOTE — Telephone Encounter (Signed)
Called home and cell numbers but no answer and no voice mail

## 2022-12-04 ENCOUNTER — Ambulatory Visit (INDEPENDENT_AMBULATORY_CARE_PROVIDER_SITE_OTHER): Payer: Medicare Other | Admitting: Pulmonary Disease

## 2022-12-04 ENCOUNTER — Encounter: Payer: Self-pay | Admitting: Pulmonary Disease

## 2022-12-04 VITALS — BP 138/70 | HR 65 | Ht 75.0 in | Wt 250.6 lb

## 2022-12-04 DIAGNOSIS — G4734 Idiopathic sleep related nonobstructive alveolar hypoventilation: Secondary | ICD-10-CM

## 2022-12-04 DIAGNOSIS — G4733 Obstructive sleep apnea (adult) (pediatric): Secondary | ICD-10-CM | POA: Diagnosis not present

## 2022-12-04 NOTE — Patient Instructions (Signed)
Nice to see you again  Continue using oxygen 2 liters at night  No changes to medications  Return to clinic in 1 year or sooner as needed

## 2022-12-04 NOTE — Progress Notes (Signed)
@Patient  ID: [name], male    DOB: 05-Jul-1945, 77 y.o.   MRN: [id number]  Chief Complaint  Patient presents with   Follow-up    Pt is here for Nocturnal Hypoxia F/U visit. Pt has no complaints today.     Referring provider: Sandford Craze, NP  HPI:   77 y.o. man whom are seeing for evaluation of nocturnal hypoxemia.  Most recent PCP note reviewed.  Most recent cardiology note reviewed  Patient feels fine.  Denies any significant dyspnea.  No breathing issues.  No cough etc.  He does have some nasal congestion.  Comes and goes.  He is here because he needs oxygen at night.  Looks like he had sleep apnea in the past.  No longer using CPAP it seems.  Using 2 L nasal at night.  When he does not use it he has significant symptoms at night and in the mornings.  Using 2 L.  With good control of the symptoms.  Good adherence to nocturnal oxygen.  CT high-resolution 2018 personally reviewed interpret as clear lungs with elevated right hemidiaphragm and compressive atelectasis, no evidence of ILD or other abnormality.  Left heart cath 2019 reviewed.  Echo 03/05/2018 reviewed which shows dilated LA, grade 1 diastolic 20, normal EF, estimated elevated LVEDP, MVR.  Normal right side.  Most recent chest imaging chest x-ray 11/24/2020 personally reviewed and interpreted as clear lungs with elevated right hemidiaphragm and signs of compressive atelectasis.  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    Questionaires / Pulmonary Flowsheets:   ACT:      No data to display          MMRC:     No data to display          Epworth:      No data to display          Tests:   FENO:  No results found for: "NITRICOXIDE"  PFT:     Latest Ref Rng & Units 11/26/2016    9:38 AM 09/23/2016   11:17 AM  PFT Results  FVC-Pre L 2.60  2.29   FVC-Predicted Pre % 50  44   FVC-Post L 2.46  2.14   FVC-Predicted Post % 47  41   Pre FEV1/FVC % % 81  80   Post FEV1/FCV % % 84  83   FEV1-Pre L 2.10  1.84   FEV1-Predicted Pre % 55  48   FEV1-Post L 2.07  1.78   DLCO uncorrected ml/min/mmHg 21.56  23.76   DLCO UNC% % 55  60   DLCO corrected ml/min/mmHg 23.77  23.19   DLCO COR %Predicted % 60  59   DLVA Predicted % 136  106   TLC L 3.85  4.32   TLC % Predicted % 47  53   RV % Predicted % 61  70   Personally reviewed, spirometry interpreted as moderate to severe restriction versus air trapping.  Lung volumes reveal severe restriction with evidence of air trapping.  DLCO moderately reduced.  WALK:      No data to display          Imaging: Personally reviewed and as per EMR and discussion in this note No results found.  Lab Results: Personally  reviewed CBC    Component Value Date/Time   WBC 6.0 07/18/2022 0608   RBC 4.18 (L) 07/18/2022 0608   HGB 12.9 (L) 07/18/2022 0608   HGB 13.6 04/14/2019 1117   HCT 39.8 07/18/2022 0608   HCT 39.4 04/14/2019 1117   PLT 135 (L) 07/18/2022 0608   PLT 158 04/14/2019 1117   MCV 95.2 07/18/2022 0608   MCV 88 04/14/2019 1117   MCH 30.9 07/18/2022 0608   MCHC 32.4 07/18/2022 0608   RDW 12.8 07/18/2022 0608   RDW 13.9 04/14/2019 1117   LYMPHSABS 1.8 05/27/2022 0917   MONOABS 0.5 05/27/2022 0917   EOSABS 0.3 05/27/2022 0917   BASOSABS 0.1 05/27/2022 0917    BMET    Component Value Date/Time   NA 141 11/26/2022 0936   NA 138 02/22/2021 0827   K 4.5 11/26/2022 0936   CL 101 11/26/2022 0936   CO2 32 11/26/2022 0936   GLUCOSE 103 (H) 11/26/2022 0936   BUN 19 11/26/2022 0936   BUN 21 02/22/2021 0827   CREATININE 1.33 11/26/2022 0936   CREATININE 1.27 (H) 11/05/2015 1532   CALCIUM 9.5 11/26/2022 0936   GFRNONAA >60 10/21/2022 0949   GFRNONAA 73 06/25/2012 0826   GFRAA  75 10/04/2019 1041   GFRAA 84 06/25/2012 0826    BNP    Component Value Date/Time   BNP 112.5 (H) 11/24/2020 2236   BNP 82.2 11/05/2015 1532    ProBNP    Component Value Date/Time   PROBNP 1,910.0 (H) 06/17/2011 1729    Specialty Problems       Pulmonary Problems   Obstructive sleep apnea-Failed CPAP    Failed CPAP Using O 2 for sleep      Allergic rhinitis    Qualifier: Diagnosis of  By: Nena Jordan        No Known Allergies  Immunization History  Administered Date(s) Administered   Fluad Quad(high Dose 65+) 11/29/2018, 01/24/2020, 12/11/2020, 01/21/2022   Fluad Trivalent(High Dose 65+) 11/26/2022   Influenza Split 12/16/2010, 03/05/2012   Influenza Whole 12/21/2007, 12/26/2008, 12/20/2009, 12/26/2009   Influenza, High Dose Seasonal PF 01/02/2017, 01/27/2018   Influenza,inj,Quad PF,6+ Mos 12/17/2012, 12/02/2013, 12/01/2014, 12/13/2015   Influenza-Unspecified 11/25/2018   PFIZER(Purple Top)SARS-COV-2 Vaccination 05/08/2019, 06/01/2019   Pneumococcal Conjugate-13 02/15/2013   Pneumococcal Polysaccharide-23 03/21/2009, 08/05/2019   Td 01/29/2009   Tdap 06/27/2013, 05/24/2020   Zoster Recombinant(Shingrix) 01/12/2020, 06/07/2020   Zoster, Live 11/26/2010    Past Medical History:  Diagnosis Date   Acute encephalopathy 12/12/2015   Acute respiratory failure with hypoxia and hypercapnia (HCC) 12/12/2015   Allergic rhinitis 02/13/2010   Qualifier: Diagnosis of  By: Nena Jordan    Angina at rest 10/22/2015   Arthritis    Ataxia 08/10/2013   Atrial fibrillation (HCC) 11/2018   B12 deficiency 08/11/2013   BACK PAIN, LUMBAR 01/29/2009   Qualifier: Diagnosis of  By: Nena Jordan    BENIGN POSITIONAL VERTIGO 11/23/2009   Qualifier: Diagnosis of  By: Nena Jordan    Chronic diastolic heart failure (HCC) 04/24/2015   Chronic pain    left sided-Kristeins   Chronically elevated hemidiaphragm    right   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) 11/04/2015   s/p CABG 2004; CFX DES 2011   Elevated troponin    Essential hypertension 12/12/2006   Qualifier: Diagnosis of  By: Charlsie Quest RMA, Lucy     GERD 12/12/2006   Qualifier: Diagnosis  of  By: Tyrone Apple, Lucy     GOUT 12/12/2006   Qualifier: Diagnosis of  By: Charlsie Quest RMA, Lucy     Hemorrhoids    Hyperlipidemia LDL goal <70 12/12/2006   Qualifier: Diagnosis of  By: Charlsie Quest RMA, Lucy      Hypogonadism male 04/10/2011   Hypothyroidism, acquired 02/15/2013   Insomnia 05/21/2013   Long term current use of anticoagulant therapy 06/24/2011   Myocardial infarction [facility])    Obstructive sleep apnea-Failed CPAP 06/17/2007   Failed CPAP Using O 2 for sleep    Paroxysmal atrial fibrillation (HCC) 12/06/2016   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; resultant hemiplegia of dominant side   Thoracic aorta atherosclerosis (HCC) 04/27/2017   Tubular adenoma of colon 05/2010   Type 2 diabetes mellitus without complication, without long-term current use of insulin (HCC) 10/05/2007   Qualifier: Diagnosis of  By: Nena Jordan    Vasovagal syncope    Vasovagal syncope    Wears glasses     Tobacco History: Social History   Tobacco Use  Smoking Status Never  Smokeless Tobacco Never   Counseling given: Not Answered   Continue to not smoke  Outpatient Encounter Medications as of 12/04/2022  Medication Sig   allopurinol (ZYLOPRIM) 100 MG tablet TAKE 2 TABLETS BY MOUTH EVERY DAY   apixaban (ELIQUIS) 5 MG TABS tablet Take 1 tablet (5 mg total) by mouth 2 (two) times daily.   atorvastatin (LIPITOR) 40 MG tablet TAKE 1 TABLET BY MOUTH EVERY DAY   benazepril (LOTENSIN) 10 MG tablet Take 1 tablet (10 mg total) by mouth daily.   benazepril (LOTENSIN) 5 MG tablet Take 1 tablet (5 mg total) by mouth daily. Take with Benazepril 10 mg   cephALEXin (KEFLEX) 500 MG capsule Take 1 capsule (500 mg total) by mouth 3 (three) times daily.   diltiazem (CARDIZEM  CD) 180 MG 24 hr capsule Take 1 capsule (180 mg total) by mouth daily.   dofetilide (TIKOSYN) 500 MCG capsule TAKE 1 CAPSULE BY MOUTH 2 TIMES DAILY.   fluocinonide gel (LIDEX) 0.05 % Apply 1 Application topically 2 (two) times daily as needed.   gabapentin (NEURONTIN) 100 MG capsule TAKE 1 CAPSULE BY MOUTH EVERYDAY AT BEDTIME   levothyroxine (SYNTHROID) 112 MCG tablet Take 1 tablet (112 mcg total) by mouth daily before breakfast.   traMADol (ULTRAM) 50 MG tablet TAKE 1 TABLET BY MOUTH TWICE A DAY   traMADol (ULTRAM-ER) 200 MG 24 hr tablet TAKE 1 TABLET BY MOUTH EVERY DAY   No facility-administered encounter medications on file as of 12/04/2022.     Review of Systems  Review of Systems  N/a Physical Exam  BP 138/70 (BP Location: Left Arm, Cuff Size: Large)   Pulse 65   Ht 6\' 3"  (1.905 m)   Wt 250 lb 9.6 oz (113.7 kg)   SpO2 95%   BMI 31.32 kg/m   Wt Readings from Last 5 Encounters:  12/04/22 250 lb 9.6 oz (113.7 kg)  12/02/22 248 lb (112.5 kg)  11/26/22 250 lb (113.4 kg)  10/30/22 244 lb (110.7 kg)  10/21/22 244 lb 3.2 oz (110.8 kg)    BMI Readings from Last 5 Encounters:  12/04/22 31.32 kg/m  12/02/22 31.00 kg/m  11/26/22 31.25 kg/m  10/30/22 30.50 kg/m  10/21/22 30.52 kg/m     Physical Exam General: Sitting in chair, no acute stress Eyes: EOMI, no icterus Neck: Supple, no JVP Pulmonary: Clear, normal work of breathing, diminished right  base Cardiovascular: Warm, no edema Abdomen: Nondistended, bowel sounds present MSK: No synovitis, no joint effusion Neuro: Slow gait, ambulates with walker, chronic deficits from stroke noted on left Psych: Normal mood, full affect   Assessment & Plan:   Nocturnal hypoxemia: History of OSA, not using CPAP.  Using 2 L nasal cannula at night.  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  Evidence of elevated LVEDP in the past.  Continue 2 L  nasal cannula to be worn at night, good adherence, he is receiving excellent clinical beneift from use.   Elevated right hemidiaphragm: Present for years.  Denies dyspnea.  No further work-up.  Restrictive pattern on prior PFTs: Unclear if actual numbers used for reference were normal given how large they are for a man that age.  He is tall so may be.  However he had CT scan high-resolution 2018 revealed no evidence of ILD.  Suspect values were inaccurate.  Nasal congestion: Improved with Mucinex.  Encouraged him to continue long-term given improvement.  Return in about 1 year (around 12/04/2023) for f/u Dr. Judeth Horn.   Karren Burly, MD 12/04/2022

## 2022-12-08 ENCOUNTER — Ambulatory Visit (HOSPITAL_COMMUNITY): Payer: Medicare Other | Admitting: Physician Assistant

## 2022-12-08 NOTE — Telephone Encounter (Signed)
Patient still not answering phone or calling back. MyChart message sent

## 2022-12-10 ENCOUNTER — Ambulatory Visit: Payer: Medicare Other | Admitting: Family

## 2022-12-11 ENCOUNTER — Encounter: Payer: Self-pay | Admitting: Family

## 2022-12-12 ENCOUNTER — Ambulatory Visit: Payer: Medicare Other | Admitting: Family

## 2022-12-21 ENCOUNTER — Other Ambulatory Visit: Payer: Self-pay | Admitting: Family

## 2022-12-24 ENCOUNTER — Telehealth: Payer: Self-pay | Admitting: *Deleted

## 2022-12-24 DIAGNOSIS — M48062 Spinal stenosis, lumbar region with neurogenic claudication: Secondary | ICD-10-CM | POA: Diagnosis not present

## 2022-12-24 DIAGNOSIS — Z6831 Body mass index (BMI) 31.0-31.9, adult: Secondary | ICD-10-CM | POA: Diagnosis not present

## 2022-12-24 NOTE — Telephone Encounter (Signed)
Pre-operative Risk Assessment    Patient Name: [name]  DOB: 1945-12-03 MRN: [id number]  DATE OF LAST VISIT: 06/05/22 Otilio Saber, PAC; 07/08/22 Eligha Bridegroom, NP TELE VISIT DATE OF NEXT VISIT: NONE    Request for Surgical Clearance    Procedure:   LUMBAR FUSION   Date of Surgery:  Clearance TBD                                 Surgeon:  DR. Monia Pouch Surgeon's Group or Practice Name:  [facility] Phone number:  [phone] EXT [phone] Fax number:  [phone] ATTN: [id number]   Type of Clearance Requested:   - Medical  - Pharmacy:  Hold Apixaban (Eliquis)     Type of Anesthesia:  General    Additional requests/questions:    Elpidio Anis   12/24/2022, 5:14 PM

## 2022-12-25 NOTE — Telephone Encounter (Signed)
Name: [name]  DOB: 08/18/1945  MRN: [id number]  Primary Cardiologist: Donato Schultz, MD   Preoperative team, please contact this patient and set up a phone call appointment for further preoperative risk assessment. Please obtain consent and complete medication review. Thank you for your help.  I confirm that guidance regarding antiplatelet and oral anticoagulation therapy has been completed and, if necessary, noted below.    Stroke in 1996, TIA in 2018 (subtherapeutic INR on warfarin at the time) Patient is at high risk off anticoagulation due to hx of stroke and high CHA2DS2VASc score previously held for 3 days for spinal procedure(06/19/2022). Patient can hold Eliquis for 3 days prior to procedure.  Please resume Eliquis as soon as possible postprocedure, at the discretion of the surgeon.     I also confirmed the patient resides in the state of [place]. As per [facility] Medical Board telemedicine laws, the patient must reside in the state in which the provider is licensed.   Joylene Grapes, NP 12/25/2022, 4:01 PM [facility]

## 2022-12-25 NOTE — Telephone Encounter (Signed)
Patient with diagnosis of afib on Eliquis for anticoagulation.    Procedure: LUMBAR FUSION  Date of procedure: TBD   CHA2DS2-VASc Score = 8   This indicates a 10.8% annual risk of stroke. The patient's score is based upon: CHF History: 1 HTN History: 1 Diabetes History: 1 Stroke History: 2 Vascular Disease History: 1 Age Score: 2 Gender Score: 0    CrCl 61 mL/min Platelet count 135 K (07/18/2022)  Stroke in 1996, TIA in 2018 (subtherapeutic INR on warfarin at the time) Patient is at high risk off anticoagulation due to hx of stroke and high CHA2DS2VASc score previously held for 3 days for spinal procedure(06/19/2022).   patient can hold Eliquis for 3 days prior to procedure.     **This guidance is not considered finalized until pre-operative APP has relayed final recommendations.**

## 2022-12-25 NOTE — Telephone Encounter (Signed)
Tried to call the pt but has SMART CALL BLOCK on his phone and call would not go through. I will update the requesting office if they s/w the pt let him know he will need a tele visit for cardiac clearance and we are trying to reach him.

## 2022-12-29 ENCOUNTER — Other Ambulatory Visit: Payer: Self-pay | Admitting: Cardiology

## 2022-12-29 DIAGNOSIS — I4811 Longstanding persistent atrial fibrillation: Secondary | ICD-10-CM

## 2022-12-29 NOTE — Telephone Encounter (Signed)
2 attempt to reach the pt to schedule a tele preop appt, though pt has our # blocked. I will update the requesting office that we cannot reach the pt. Pt will need to call our office and ask to s/w the pre op team.

## 2022-12-30 NOTE — Telephone Encounter (Signed)
Prescription refill request for Eliquis received. Indication:afib Last office visit:8/24 Scr:1.33  9/24 Age: 77 Weight:113.7  kg  Prescription refilled

## 2022-12-31 NOTE — Telephone Encounter (Signed)
Our office has tried x 3 to reach the pt to schedule a tele pre op appt, though cannot reach pt as he has SMART BLOCK on his phone. We cannot leave a message for the pt. I will update the requesting office the pt needs to call our office to schedule a tele visit for pre op.

## 2023-01-01 ENCOUNTER — Other Ambulatory Visit: Payer: Self-pay

## 2023-01-01 ENCOUNTER — Encounter (HOSPITAL_COMMUNITY): Payer: Self-pay | Admitting: Emergency Medicine

## 2023-01-01 ENCOUNTER — Emergency Department (HOSPITAL_COMMUNITY)
Admission: EM | Admit: 2023-01-01 | Discharge: 2023-01-02 | Disposition: A | Discharge status: Home / Self Care | Payer: Medicare Other | Attending: Emergency Medicine | Admitting: Emergency Medicine

## 2023-01-01 ENCOUNTER — Emergency Department (HOSPITAL_COMMUNITY): Payer: Medicare Other

## 2023-01-01 DIAGNOSIS — R1031 Right lower quadrant pain: Secondary | ICD-10-CM | POA: Insufficient documentation

## 2023-01-01 DIAGNOSIS — N499 Inflammatory disorder of unspecified male genital organ: Secondary | ICD-10-CM | POA: Diagnosis not present

## 2023-01-01 DIAGNOSIS — S79921A Unspecified injury of right thigh, initial encounter: Secondary | ICD-10-CM | POA: Diagnosis present

## 2023-01-01 DIAGNOSIS — S39011A Strain of muscle, fascia and tendon of abdomen, initial encounter: Secondary | ICD-10-CM | POA: Diagnosis not present

## 2023-01-01 DIAGNOSIS — Z79899 Other long term (current) drug therapy: Secondary | ICD-10-CM | POA: Insufficient documentation

## 2023-01-01 DIAGNOSIS — K4090 Unilateral inguinal hernia, without obstruction or gangrene, not specified as recurrent: Secondary | ICD-10-CM | POA: Diagnosis not present

## 2023-01-01 DIAGNOSIS — Z7901 Long term (current) use of anticoagulants: Secondary | ICD-10-CM | POA: Insufficient documentation

## 2023-01-01 DIAGNOSIS — S76211A Strain of adductor muscle, fascia and tendon of right thigh, initial encounter: Secondary | ICD-10-CM | POA: Diagnosis not present

## 2023-01-01 DIAGNOSIS — X58XXXA Exposure to other specified factors, initial encounter: Secondary | ICD-10-CM | POA: Diagnosis not present

## 2023-01-01 DIAGNOSIS — R9389 Abnormal findings on diagnostic imaging of other specified body structures: Secondary | ICD-10-CM | POA: Diagnosis not present

## 2023-01-01 DIAGNOSIS — S76811A Strain of other specified muscles, fascia and tendons at thigh level, right thigh, initial encounter: Secondary | ICD-10-CM | POA: Diagnosis not present

## 2023-01-01 LAB — CBC WITH DIFFERENTIAL/PLATELET
Abs Immature Granulocytes: 0.03 10*3/uL (ref 0.00–0.07)
Basophils Absolute: 0.1 10*3/uL (ref 0.0–0.1)
Basophils Relative: 1 %
Eosinophils Absolute: 0.2 10*3/uL (ref 0.0–0.5)
Eosinophils Relative: 3 %
HCT: 40.3 % (ref 39.0–52.0)
Hemoglobin: 13.3 g/dL (ref 13.0–17.0)
Immature Granulocytes: 0 %
Lymphocytes Relative: 18 %
Lymphs Abs: 1.4 10*3/uL (ref 0.7–4.0)
MCH: 30.4 pg (ref 26.0–34.0)
MCHC: 33 g/dL (ref 30.0–36.0)
MCV: 92.2 fL (ref 80.0–100.0)
Monocytes Absolute: 0.6 10*3/uL (ref 0.1–1.0)
Monocytes Relative: 7 %
Neutro Abs: 5.5 10*3/uL (ref 1.7–7.7)
Neutrophils Relative %: 71 %
Platelets: 148 10*3/uL — ABNORMAL LOW (ref 150–400)
RBC: 4.37 MIL/uL (ref 4.22–5.81)
RDW: 13 % (ref 11.5–15.5)
WBC: 7.9 10*3/uL (ref 4.0–10.5)
nRBC: 0 % (ref 0.0–0.2)

## 2023-01-01 LAB — COMPREHENSIVE METABOLIC PANEL
ALT: 43 U/L (ref 0–44)
AST: 40 U/L (ref 15–41)
Albumin: 4 g/dL (ref 3.5–5.0)
Alkaline Phosphatase: 50 U/L (ref 38–126)
Anion gap: 11 (ref 5–15)
BUN: 21 mg/dL (ref 8–23)
CO2: 26 mmol/L (ref 22–32)
Calcium: 9.7 mg/dL (ref 8.9–10.3)
Chloride: 104 mmol/L (ref 98–111)
Creatinine, Ser: 1.28 mg/dL — ABNORMAL HIGH (ref 0.61–1.24)
GFR, Estimated: 57 mL/min — ABNORMAL LOW (ref >60)
Glucose, Bld: 130 mg/dL — ABNORMAL HIGH (ref 70–99)
Potassium: 4.8 mmol/L (ref 3.5–5.1)
Sodium: 141 mmol/L (ref 135–145)
Total Bilirubin: 1 mg/dL (ref 0.3–1.2)
Total Protein: 7 g/dL (ref 6.5–8.1)

## 2023-01-01 MED ORDER — IOHEXOL 350 MG/ML SOLN
60.0000 mL | Freq: Once | INTRAVENOUS | Status: AC | PRN
  Administered 2023-01-01: 60 mL via INTRAVENOUS

## 2023-01-01 NOTE — ED Provider Triage Note (Signed)
Emergency Medicine Provider Triage Evaluation Note  [name] , a 77 y.o. male  was evaluated in triage.  Pt complains of RLQ and right groing apin since this morning. Reports it started when he was eating toast. Intemittient in nature. No nausea, vomiting, or bowel changes. No urinary symptoms. Denies any pain into his penis or testicles.  Review of Systems  Positive:  Negative:   Physical Exam  BP 131/72 (BP Location: Right Arm)   Pulse 62   Temp 98.7 F (37.1 C) (Oral)   Resp 18   Ht 6\' 3"  (1.905 m)   Wt 113.4 kg   SpO2 93%   BMI 31.25 kg/m  Gen:   Awake, no distress   Resp:  Normal effort  MSK:   Moves extremities without difficulty  Other:  RLQ and right groin tenderness. No tenderenss into the anterior thigh. Abdomen soft.   Medical Decision Making  Medically screening exam initiated at 6:42 PM.  Appropriate orders placed.  [name] was informed that the remainder of the evaluation will be completed by another provider, this initial triage assessment does not replace that evaluation, and the importance of remaining in the ED until their evaluation is complete.  CT and labs ordered. The patient does not appear to be in any acute distress.    Achille Rich, PA-C 01/01/23 1843

## 2023-01-01 NOTE — ED Triage Notes (Signed)
Per [facility] pt coming from home c/o back and groin pain onset of this morning while siting at table. Denies any urinary symptoms. No N/V/D. Hx of stroke with left sided deficits. Denies any injury.

## 2023-01-02 DIAGNOSIS — S76211A Strain of adductor muscle, fascia and tendon of right thigh, initial encounter: Secondary | ICD-10-CM | POA: Diagnosis not present

## 2023-01-02 LAB — URINALYSIS, W/ REFLEX TO CULTURE (INFECTION SUSPECTED)
Bilirubin Urine: NEGATIVE
Glucose, UA: NEGATIVE mg/dL
Hgb urine dipstick: NEGATIVE
Ketones, ur: NEGATIVE mg/dL
Leukocytes,Ua: NEGATIVE
Nitrite: NEGATIVE
Protein, ur: NEGATIVE mg/dL
Specific Gravity, Urine: >1.046 — ABNORMAL HIGH (ref 1.005–1.030)
pH: 5 (ref 5.0–8.0)

## 2023-01-02 MED ORDER — HYDROCODONE-ACETAMINOPHEN 5-325 MG PO TABS
1.0000 | ORAL_TABLET | Freq: Once | ORAL | Status: AC
  Administered 2023-01-02: 1 via ORAL
  Filled 2023-01-02: qty 1

## 2023-01-02 MED ORDER — HYDROCODONE-ACETAMINOPHEN 5-325 MG PO TABS
1.0000 | ORAL_TABLET | ORAL | 0 refills | Status: DC | PRN
Start: 2023-01-02 — End: 2023-09-04

## 2023-01-02 NOTE — ED Provider Notes (Signed)
[place] EM[facility] DEPARTMENT AT [facility] Provider Note   CSN: [id number] Arrival date & time: 01/01/23  1759     History  Chief Complaint  Patient presents with   Groin Pain   Back Pain    [name] is a 77 y.o. male.  Patient presents for evaluation of right groin pain.  Patient reports that the pain worsens when he lifts his leg.  No known injury.  No nausea, vomiting, diarrhea.       Home Medications Prior to Admission medications   Medication Sig Start Date End Date Taking? Authorizing Provider  allopurinol (ZYLOPRIM) 100 MG tablet TAKE 2 TABLETS BY MOUTH EVERY DAY 11/17/22   Sandford Craze, NP  atorvastatin (LIPITOR) 40 MG tablet TAKE 1 TABLET BY MOUTH EVERY DAY 09/22/22   Sandford Craze, NP  benazepril (LOTENSIN) 10 MG tablet Take 1 tablet (10 mg total) by mouth daily. 09/16/22   Sandford Craze, NP  benazepril (LOTENSIN) 5 MG tablet Take 1 tablet (5 mg total) by mouth daily. Take with Benazepril 10 mg 10/23/22   Sandford Craze, NP  cephALEXin (KEFLEX) 500 MG capsule Take 1 capsule (500 mg total) by mouth 3 (three) times daily. 11/26/22   Sandford Craze, NP  diltiazem (CARDIZEM CD) 180 MG 24 hr capsule Take 1 capsule (180 mg total) by mouth daily. 10/21/22 01/19/23  Fenton, Clint R, PA  dofetilide (TIKOSYN) 500 MCG capsule TAKE 1 CAPSULE BY MOUTH 2 TIMES DAILY. 10/15/22   [facility], MD  ELIQUIS 5 MG TABS tablet TAKE 1 TABLET BY MOUTH TWICE A DAY 12/30/22   Jake Bathe, MD  fluocinonide gel (LIDEX) 0.05 % Apply 1 Application topically 2 (two) times daily as needed. 11/26/22   Sandford Craze, NP  gabapentin (NEURONTIN) 100 MG capsule TAKE 1 CAPSULE BY MOUTH EVERYDAY AT BEDTIME 09/27/22   Sandford Craze, NP  levothyroxine (SYNTHROID) 112 MCG tablet Take 1 tablet (112 mcg total) by mouth daily before breakfast. 10/06/22   Sandford Craze, NP  pantoprazole (PROTONIX) 40 MG tablet TAKE 1 TABLET BY MOUTH EVERY DAY BEFORE  BREAKFAST 12/21/22   Sandford Craze, NP  traMADol (ULTRAM) 50 MG tablet TAKE 1 TABLET BY MOUTH TWICE A DAY 09/15/22   Kirsteins, Victorino Sparrow, MD  traMADol (ULTRAM-ER) 200 MG 24 hr tablet TAKE 1 TABLET BY MOUTH EVERY DAY 09/15/22   Kirsteins, Victorino Sparrow, MD      Allergies    Patient has no known allergies.    Review of Systems   Review of Systems  Physical Exam Updated Vital Signs BP 139/72 (BP Location: Right Arm)   Pulse 67   Temp 98.4 F (36.9 C) (Oral)   Resp 18   Ht 6\' 3"  (1.905 m)   Wt 113.4 kg   SpO2 96%   BMI 31.25 kg/m  Physical Exam Vitals and nursing note reviewed.  Constitutional:      General: He is not in acute distress.    Appearance: He is well-developed.  HENT:     Head: Normocephalic and atraumatic.     Mouth/Throat:     Mouth: Mucous membranes are moist.  Eyes:     General: Vision grossly intact. Gaze aligned appropriately.     Extraocular Movements: Extraocular movements intact.     Conjunctiva/sclera: Conjunctivae normal.  Cardiovascular:     Rate and Rhythm: Normal rate and regular rhythm.     Pulses: Normal pulses.     Heart sounds: Normal heart sounds, S1 normal and  S2 normal. No murmur heard.    No friction rub. No gallop.  Pulmonary:     Effort: Pulmonary effort is normal. No respiratory distress.     Breath sounds: Normal breath sounds.  Abdominal:     Palpations: Abdomen is soft.     Tenderness: There is no abdominal tenderness. There is no guarding or rebound.     Hernia: No hernia is present.     Comments: No obvious hernia.  Patient with significant groin pain trying to raise right leg against gravity  Musculoskeletal:        General: No swelling.     Cervical back: Full passive range of motion without pain, normal range of motion and neck supple. No pain with movement, spinous process tenderness or muscular tenderness. Normal range of motion.     Right lower leg: No edema.     Left lower leg: No edema.  Skin:    General: Skin is warm  and dry.     Capillary Refill: Capillary refill takes less than 2 seconds.     Findings: No ecchymosis, erythema, lesion or wound.  Neurological:     Mental Status: He is alert and oriented to person, place, and time.     GCS: GCS eye subscore is 4. GCS verbal subscore is 5. GCS motor subscore is 6.     Cranial Nerves: Cranial nerves 2-12 are intact.     Sensory: Sensation is intact.     Motor: Motor function is intact. No weakness or abnormal muscle tone.     Coordination: Coordination is intact.  Psychiatric:        Mood and Affect: Mood normal.        Speech: Speech normal.        Behavior: Behavior normal.     ED Results / Procedures / Treatments   Labs (all labs ordered are listed, but only abnormal results are displayed) Labs Reviewed  CBC WITH DIFFERENTIAL/PLATELET - Abnormal; Notable for the following components:      Result Value   Platelets 148 (*)    All other components within normal limits  COMPREHENSIVE METABOLIC PANEL - Abnormal; Notable for the following components:   Glucose, Bld 130 (*)    Creatinine, Ser 1.28 (*)    GFR, Estimated 57 (*)    All other components within normal limits  URINALYSIS, W/ REFLEX TO CULTURE (INFECTION SUSPECTED) - Abnormal; Notable for the following components:   APPearance HAZY (*)    Specific Gravity, Urine >1.046 (*)    Bacteria, UA RARE (*)    All other components within normal limits    EKG None  Radiology CT ABDOMEN PELVIS W CONTRAST  Result Date: 01/01/2023 CLINICAL DATA:  Right lower quadrant pain EXAM: CT ABDOMEN AND PELVIS WITH CONTRAST TECHNIQUE: Multidetector CT imaging of the abdomen and pelvis was performed using the standard protocol following bolus administration of intravenous contrast. RADIATION DOSE REDUCTION: This exam was performed according to the departmental dose-optimization program which includes automated exposure control, adjustment of the mA and/or kV according to patient size and/or use of iterative  reconstruction technique. CONTRAST:  60mL OMNIPAQUE IOHEXOL 350 MG/ML SOLN COMPARISON:  CT 11/03/2021 FINDINGS: Lower chest: Lower sternotomy. Lung bases show mild atelectasis at the right base. Chronic elevation of the right diaphragm. Hepatobiliary: No focal liver abnormality is seen. No gallstones, gallbladder wall thickening, or biliary dilatation. Pancreas: Unremarkable. No pancreatic ductal dilatation or surrounding inflammatory changes. Spleen: Normal in size without focal abnormality. Adrenals/Urinary Tract:  Adrenal glands are unremarkable. Kidneys are normal, without renal calculi, focal lesion, or hydronephrosis. Bladder is unremarkable. Stomach/Bowel: Stomach is within normal limits. Appendix appears normal. No evidence of bowel wall thickening, distention, or inflammatory changes. Vascular/Lymphatic: Moderate aortic atherosclerosis. No aneurysm. No suspicious lymph nodes Reproductive: Prostate is unremarkable. Other: Negative for pelvic effusion or free air. Small fat containing right inguinal hernia Musculoskeletal: Multilevel degenerative changes. No acute osseous abnormality. IMPRESSION: 1. Negative for acute appendicitis. 2. Small fat containing right inguinal hernia. 3. Aortic atherosclerosis. Aortic Atherosclerosis (ICD10-I70.0). Electronically Signed   By: Jasmine Pang M.D.   On: 01/01/2023 20:43    Procedures Procedures    Medications Ordered in ED Medications  iohexol (OMNIPAQUE) 350 MG/ML injection 60 mL (60 mLs Intravenous Contrast Given 01/01/23 2031)    ED Course/ Medical Decision Making/ A&P                                 Medical Decision Making  Differential Diagnosis considered includes, but not limited to: Appendicitis; colitis; diverticulitis; bowel obstruction; cystitis; nephrolithiasis; pyelonephritis.   Patient with benign abdominal exam at rest.  He does have fairly significant pain in the inguinal region when he tries to raise his leg.  Examination most  consistent with musculoskeletal pain.  Patient's urinalysis without signs of infection, no hematuria.  Lab work reassuring.  CT abdomen and pelvis without abnormality.        Final Clinical Impression(s) / ED Diagnoses Final diagnoses:  Inguinal strain, right, initial encounter    Rx / DC Orders ED Discharge Orders     None         Kellye Mizner, Canary Brim, MD 01/02/23 [phone]

## 2023-01-02 NOTE — Discharge Instructions (Signed)
Your lab work and CAT scan were reassuring, no serious abnormalities are seen.  Your pain seems to be from a muscle strain in the groin, minimize stress on the area.  These strains can take a while to heal.  Schedule follow-up with your primary care doctor, you may need physical therapy to help with your recovery.

## 2023-01-03 ENCOUNTER — Other Ambulatory Visit: Payer: Self-pay | Admitting: Family

## 2023-01-03 DIAGNOSIS — Z8639 Personal history of other endocrine, nutritional and metabolic disease: Secondary | ICD-10-CM

## 2023-01-05 NOTE — Plan of Care (Signed)
CHL Tonsillectomy/Adenoidectomy, Postoperative PEDS care plan entered in error.

## 2023-01-19 ENCOUNTER — Other Ambulatory Visit: Payer: Self-pay | Admitting: Family

## 2023-01-28 ENCOUNTER — Ambulatory Visit: Payer: Medicare Other | Admitting: Family

## 2023-01-28 VITALS — BP 128/63 | HR 58 | Temp 98.2°F | Resp 16 | Ht 75.0 in | Wt 251.0 lb

## 2023-01-28 DIAGNOSIS — M545 Low back pain, unspecified: Secondary | ICD-10-CM | POA: Diagnosis not present

## 2023-01-28 DIAGNOSIS — S3991XA Unspecified injury of abdomen, initial encounter: Secondary | ICD-10-CM | POA: Diagnosis not present

## 2023-01-28 NOTE — Assessment & Plan Note (Signed)
Advised pt to schedule follow up with his neurosurgeon to further discuss risks/benefits of proposed surgery.

## 2023-01-28 NOTE — Assessment & Plan Note (Signed)
Improving.  Pt is advised to let me know if his symptoms do not continue to improve.

## 2023-01-28 NOTE — Progress Notes (Signed)
Subjective:     Patient ID: [name], male    DOB: August 29, 1945, 77 y.o.   MRN: [id number]  Chief Complaint  Patient presents with   Groin Pain    Patient reports right groin pain radiating down his leg.     HPI  Discussed the use of AI scribe software for clinical note transcription with the patient, who gave verbal consent to proceed.  History of Present Illness    The patient, with a history of stroke and coronary artery bypass surgery, presents with ongoing back pain and a recent groin pull.  He was seen in the ED on 10/17 for the right groin pull. The back pain is located across the waist and is worse on the right side due to numbness on the left from a previous stroke. The patient is considering surgery for the back pain, despite opposition from his spouse who fears potential complications and worsening of the patient's overall condition. His neurosurgeon has recommended an L2-4 open laminectomy and decompression.   The patient reports that the groin pain is improving but still present, particularly when walking. The pain is primarily on the right side, as the patient has reduced sensation on the left.       Lab Results  Component Value Date   HGBA1C 6.2 11/26/2022     Health Maintenance Due  Topic Date Due   OPHTHALMOLOGY EXAM  10/16/2014   Medicare Annual Wellness (AWV)  05/21/2017   COVID-19 Vaccine (3 - Pfizer risk series) 06/29/2019    Past Medical History:  Diagnosis Date   Acute encephalopathy 12/12/2015   Acute respiratory failure with hypoxia and hypercapnia (HCC) 12/12/2015   Allergic rhinitis 02/13/2010   Qualifier: Diagnosis of  By: Nena Jordan    Angina at rest [facility]) 10/22/2015   Arthritis    Ataxia 08/10/2013   Atrial fibrillation (HCC) 11/2018   B12 deficiency 08/11/2013   BACK PAIN, LUMBAR 01/29/2009   Qualifier: Diagnosis of  By: Nena Jordan    BENIGN POSITIONAL VERTIGO 11/23/2009   Qualifier: Diagnosis of  By: Nena Jordan    Chronic diastolic heart failure (HCC) 04/24/2015   Chronic pain    left sided-Kristeins   Chronically elevated hemidiaphragm    right   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/04/2015   s/p CABG 2004; CFX DES 2011   Elevated troponin    Essential hypertension 12/12/2006   Qualifier: Diagnosis of  By: Charlsie Quest RMA, Lucy     GERD 12/12/2006   Qualifier: Diagnosis of  By: Tyrone Apple, Lucy     GOUT 12/12/2006   Qualifier: Diagnosis of  By: Charlsie Quest RMA, Lucy     Hemorrhoids    Hyperlipidemia LDL goal <70 12/12/2006   Qualifier: Diagnosis of  By: Charlsie Quest RMA, Lucy      Hypogonadism male 04/10/2011   Hypothyroidism, acquired 02/15/2013   Insomnia 05/21/2013   Long term current use of anticoagulant therapy 06/24/2011   Myocardial infarction (HCC)    Obstructive sleep apnea-Failed CPAP 06/17/2007   Failed CPAP Using O 2 for sleep    Paroxysmal atrial fibrillation (HCC) 12/06/2016   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 Robert    Vasovagal syncope    Vasovagal syncope    Wears glasses     Past Surgical History:  Procedure Laterality Date   CARDIAC CATHETERIZATION N/A 10/25/2015   Procedure: Left Heart Cath and Cors/Grafts Angiography;  Surgeon: Lennette Bihari, MD;  Location: [facility] INVASIVE CV LAB;  Service: Cardiovascular;  Laterality: N/A;   CARDIOVERSION  06/20/2011   Procedure: CARDIOVERSION;  Surgeon: Laurey Morale, MD;  Location: [facility] ENDOSCOPY;  Service: Cardiovascular;  Laterality: N/A;   CARDIOVERSION N/A 04/26/2015   Procedure: CARDIOVERSION;  Surgeon: Lars Masson, MD;  Location: [facility] ENDOSCOPY;  Service: Cardiovascular;  Laterality: N/A;   CARDIOVERSION N/A  05/10/2015   Procedure: CARDIOVERSION;  Surgeon: Laurey Morale, MD;  Location: [facility] ENDOSCOPY;  Service: Cardiovascular;  Laterality: N/A;   CARDIOVERSION N/A 12/10/2018   Procedure: CARDIOVERSION;  Surgeon: Thurmon Fair, MD;  Location: [facility] ENDOSCOPY;  Service: Cardiovascular;  Laterality: N/A;   CARPOMETACARPAL ([facility]) FUSION OF THUMB Right 07/18/2022   Procedure: right carpo metacarpal suspensionplasty with abductor tendon transfer right wrist stenosing tenosynovitis release, right index carpo metacarpal boss excision;  Surgeon: Dairl Ponder, MD;  Location: [facility] OR;  Service: Orthopedics;  Laterality: Right;   CATARACT EXTRACTION  05/2010   left eye   CATARACT EXTRACTION  04/2010   right eye   COLONOSCOPY WITH PROPOFOL N/A 02/22/2020   Procedure: COLONOSCOPY WITH PROPOFOL;  Surgeon: Shellia Cleverly, DO;  Location: [facility] ENDOSCOPY;  Service: Gastroenterology;  Laterality: N/A;   CORONARY ARTERY BYPASS GRAFT     stent   DORSAL COMPARTMENT RELEASE Right 07/18/2022   Procedure: RELEASE DORSAL COMPARTMENT (DEQUERVAIN);  Surgeon: Dairl Ponder, MD;  Location: [facility] OR;  Service: Orthopedics;  Laterality: Right;  Anethesia Axillary block/ MAC   ESOPHAGOGASTRODUODENOSCOPY  03-25-2005   HEMORRHOID SURGERY N/A 09/05/2021   Procedure: HEMORRHOIDECTOMY WITH LIGATION AND HEMORRHOIDOPEXY;  Surgeon: Karie Soda, MD;  Location: [facility] ORS;  Service: General;  Laterality: N/A;   LEFT HEART CATH AND CORS/GRAFTS ANGIOGRAPHY N/A 02/19/2018   Procedure: LEFT HEART CATH AND CORS/GRAFTS ANGIOGRAPHY;  Surgeon: Swaziland, Peter M, MD;  Location: [facility] INVASIVE CV LAB;  Service: Cardiovascular;  Laterality: N/A;   NASAL SEPTUM SURGERY     PERCUTANEOUS PLACEMENT INTRAVASCULAR STENT CERVICAL CAROTID ARTERY     03-2009; using a drug-eluting platform of the circumflex cornoray artery with a 3.0 x 18 [employer] Promus drug-eluting platform post dilated to 3.75 with a noncompliant balloon.   POLYPECTOMY  02/22/2020   Procedure:  POLYPECTOMY;  Surgeon: Shellia Cleverly, DO;  Location: [facility] ENDOSCOPY;  Service: Gastroenterology;;   RECTAL EXAM UNDER ANESTHESIA N/A 09/05/2021   Procedure: ANORECTAL EXAM UNDER ANESTHESIA;  Surgeon: Karie Soda, MD;  Location: [facility] ORS;  Service: General;  Laterality: N/A;   TEE WITHOUT CARDIOVERSION  06/20/2011   Procedure: TRANSESOPHAGEAL ECHOCARDIOGRAM (TEE);  Surgeon: Laurey Morale, MD;  Location: [facility] ENDOSCOPY;  Service: Cardiovascular;  Laterality: N/A;    Family History  Problem Relation Age of Onset   Lung cancer Father        deceased   Stroke Mother        deceased-MINISTROKES   Colon cancer Neg Hx    Esophageal cancer Neg Hx    Stomach cancer Neg Hx    Pancreatic cancer Neg Hx    Liver disease Neg Hx     Social History   Socioeconomic History   Marital status: Married    Spouse name: [name]   Number of children: Not  on file   Years of education: 14   Highest education level: Not on file  Occupational History   Occupation: retired    Associate Professor: OTHER    Comment: Physicist, medical  Tobacco Use   Smoking status: Never   Smokeless tobacco: Never  Vaping Use   Vaping status: Never Used  Substance and Sexual Activity   Alcohol use: Not Currently   Drug use: No   Sexual activity: Not Currently  Other Topics Concern   Not on file  Social History Narrative   Pt lives with wife. Does have stairs, but patient doesn't use them. Pt has completed technical school   Social Determinants of Health   Financial Resource Strain: Not on file  Food Insecurity: Not on file  Transportation Needs: Not on file  Physical Activity: Not on file  Stress: Not on file  Social Connections: Not on file  Intimate Partner Violence: Not on file    Outpatient Medications Prior to Visit  Medication Sig Dispense Refill   allopurinol (ZYLOPRIM) 100 MG tablet TAKE 2 TABLETS BY MOUTH EVERY DAY 180 tablet 1   atorvastatin (LIPITOR) 40 MG tablet TAKE 1 TABLET BY MOUTH EVERY DAY 90 tablet 1    benazepril (LOTENSIN) 10 MG tablet Take 1 tablet (10 mg total) by mouth daily. 90 tablet 1   benazepril (LOTENSIN) 5 MG tablet Take 1 tablet (5 mg total) by mouth daily. Take with Benazepril 10 mg 90 tablet 1   dofetilide (TIKOSYN) 500 MCG capsule TAKE 1 CAPSULE BY MOUTH 2 TIMES DAILY. 180 capsule 1   ELIQUIS 5 MG TABS tablet TAKE 1 TABLET BY MOUTH TWICE A DAY 180 tablet 1   fluocinonide gel (LIDEX) 0.05 % Apply 1 Application topically 2 (two) times daily as needed. 60 g 1   folic acid (FOLVITE) 1 MG tablet TAKE 1 TABLET BY MOUTH EVERY DAY 90 tablet 1   gabapentin (NEURONTIN) 100 MG capsule TAKE 1 CAPSULE BY MOUTH EVERYDAY AT BEDTIME 90 capsule 1   HYDROcodone-acetaminophen (NORCO/VICODIN) 5-325 MG tablet Take 1 tablet by mouth every 4 (four) hours as needed for moderate pain (pain score 4-6). 10 tablet 0   levothyroxine (SYNTHROID) 112 MCG tablet TAKE 1 TABLET BY MOUTH DAILY BEFORE BREAKFAST. 90 tablet 0   pantoprazole (PROTONIX) 40 MG tablet TAKE 1 TABLET BY MOUTH EVERY DAY BEFORE BREAKFAST 90 tablet 1   traMADol (ULTRAM) 50 MG tablet TAKE 1 TABLET BY MOUTH TWICE A DAY 180 tablet 1   traMADol (ULTRAM-ER) 200 MG 24 hr tablet TAKE 1 TABLET BY MOUTH EVERY DAY 90 tablet 1   diltiazem (CARDIZEM CD) 180 MG 24 hr capsule Take 1 capsule (180 mg total) by mouth daily. 90 capsule 2   cephALEXin (KEFLEX) 500 MG capsule Take 1 capsule (500 mg total) by mouth 3 (three) times daily. 21 capsule 0   No facility-administered medications prior to visit.    No Known Allergies  ROS See HPI    Objective:    Physical Exam Constitutional:      General: He is not in acute distress.    Appearance: He is well-developed.  HENT:     Head: Normocephalic and atraumatic.  Cardiovascular:     Rate and Rhythm: Normal rate and regular rhythm.     Heart sounds: No murmur heard. Pulmonary:     Effort: Pulmonary effort is normal. No respiratory distress.     Breath sounds: Normal breath sounds. No wheezing or  rales.  Skin:  General: Skin is warm and dry.  Neurological:     Mental Status: He is alert and oriented to person, place, and time.  Psychiatric:        Behavior: Behavior normal.        Thought Content: Thought content normal.      BP 128/63 (BP Location: Right Arm, Patient Position: Sitting, Cuff Size: Large)   Pulse (!) 58   Temp 98.2 F (36.8 C) (Oral)   Resp 16   Ht 6\' 3"  (1.905 m)   Wt 251 lb (113.9 kg)   SpO2 94%   BMI 31.37 kg/m  Wt Readings from Last 3 Encounters:  01/28/23 251 lb (113.9 kg)  01/01/23 250 lb (113.4 kg)  12/04/22 250 lb 9.6 oz (113.7 kg)       Assessment & Plan:   Problem List Items Addressed This Visit       Unprioritized   Low back pain    Advised pt to schedule follow up with his neurosurgeon to further discuss risks/benefits of proposed surgery.       Injury of groin - Primary    Improving.  Pt is advised to let me know if his symptoms do not continue to improve.      20 minutes spent on today's visit. Time was spent counseling patient.   I have discontinued Shalamar Shimoda. Ortman's cephALEXin. I am also having him maintain his traMADol, traMADol, benazepril, atorvastatin, gabapentin, dofetilide, diltiazem, benazepril, allopurinol, fluocinonide gel, pantoprazole, Eliquis, HYDROcodone-acetaminophen, levothyroxine, and folic acid.  No orders of the defined types were placed in this encounter.

## 2023-01-28 NOTE — Patient Instructions (Signed)
VISIT SUMMARY:  You visited [place] today to discuss your ongoing back pain and recent groin pull. We also reviewed your general health maintenance, including vaccinations and future lab work.  YOUR PLAN:  -LOWER BACK PAIN: Your lower back pain is severe and likely due to nerve impingement. We discussed the possibility of surgery, and I suggested having a detailed conversation with your surgeon about the risks and benefits. It's important to involve your wife in this decision-making process.  -GROIN STRAIN: Your right-sided groin pain is improving but still present, especially when walking. Continue with conservative management, and if there is no further improvement, please notify the clinic.  -GENERAL HEALTH MAINTENANCE: You received your annual flu shot today. We also discussed the benefits of getting a COVID-19 booster shot, which you are considering. We plan to follow up in two months and update your lab work at that time.  INSTRUCTIONS:  Please follow up with the clinic in two months for a review and updated lab work. If your groin pain does not improve, notify the clinic sooner.

## 2023-01-29 ENCOUNTER — Encounter: Payer: Medicare Other | Attending: Physical Medicine & Rehabilitation | Admitting: Physical Medicine & Rehabilitation

## 2023-01-29 ENCOUNTER — Telehealth: Payer: Self-pay | Admitting: *Deleted

## 2023-01-29 ENCOUNTER — Encounter: Payer: Self-pay | Admitting: Physical Medicine & Rehabilitation

## 2023-01-29 ENCOUNTER — Telehealth: Payer: Self-pay

## 2023-01-29 VITALS — BP 120/74 | HR 70 | Temp 98.1°F | Ht 75.0 in | Wt 251.0 lb

## 2023-01-29 DIAGNOSIS — I69352 Hemiplegia and hemiparesis following cerebral infarction affecting left dominant side: Secondary | ICD-10-CM | POA: Insufficient documentation

## 2023-01-29 DIAGNOSIS — G8112 Spastic hemiplegia affecting left dominant side: Secondary | ICD-10-CM | POA: Diagnosis not present

## 2023-01-29 DIAGNOSIS — G8110 Spastic hemiplegia affecting unspecified side: Secondary | ICD-10-CM | POA: Insufficient documentation

## 2023-01-29 MED ORDER — ONABOTULINUMTOXINA 100 UNITS IJ SOLR
100.0000 [IU] | Freq: Once | INTRAMUSCULAR | Status: AC
Start: 2023-01-29 — End: 2023-01-29
  Administered 2023-01-29: 100 [IU] via INTRAMUSCULAR

## 2023-01-29 MED ORDER — SODIUM CHLORIDE (PF) 0.9 % IJ SOLN
4.0000 mL | Freq: Once | INTRAMUSCULAR | Status: AC
Start: 2023-01-29 — End: 2023-01-29
  Administered 2023-01-29: 4 mL via INTRAVENOUS

## 2023-01-29 NOTE — Progress Notes (Signed)
Botox Injection for spasticity using needle EMG guidance  Dilution: 50 Units/ml Indication: Severe spasticity which interferes with ADL,mobility and/or  hygiene and is unresponsive to medication management and other conservative care Informed consent was obtained after describing risks and benefits of the procedure with the patient. This includes bleeding, bruising, infection, excessive weakness, or medication side effects. A REMS form is on file and signed. Needle: 27g 1" needle electrode Number of units per muscle LEFT UE Biceps50  LEFT LE  Medial Hamstrings250 All injections were done after obtaining appropriate EMG activity and after negative drawback for blood. The patient tolerated the procedure well. Post procedure instructions were given. A followup appointment was made.      Patient had a fall at home a week and a half ago has some tenderness in his low back area continued pain. He requests a shot for this. He has had no weakness no loss of bowel or bladder function Exam There is no point tenderness along the lumbar spinous processes.  Mild tenderness over the upper part of the sacrum.  No tenderness along the lumbar paraspinal area. He ambulates with a walker no evidence of toe drag and instability Impression acute exacerbation of chronic low back pain Given that it has been going on for a week and a half we will check some x-rays given his age and history of trauma. Will give shot of Toradol 60mg  IM today.  Return to clinic in 4 weeks

## 2023-01-29 NOTE — Telephone Encounter (Signed)
[name] has a pending Epidural Steroid Injection on 03/12/2023. Today a fax was sent to Dr. Donato Schultz for permission to hold Rx Eliquis for 3 days prior.

## 2023-01-29 NOTE — Patient Instructions (Addendum)
Will need to stop Eliquis 3 days prior to the epidural injection   It is possible to do in hospital rehab for 2 weeks after back surgery , I could be your MD there after you finish surgery   You received a Botox injection today. You may experience soreness at the needle injection sites. Please call us if any of the injection sites turns red after a couple days or if there is any drainage. You may experience muscle weakness as a result of Botox. This would improve with time but can take several weeks to improve. The Botox should start working in about one week. The Botox usually last 3 months. The injection can be repeated every 3 months as needed.

## 2023-01-29 NOTE — Telephone Encounter (Signed)
   Pre-operative Risk Assessment    Patient Name: [name]  DOB: 1945-10-06 MRN: [id number]  DATE OF LAST VISIT: 06/05/22 Otilio Saber, PAC DATE OF NEXT VISIT: NONE    Request for Surgical Clearance    Procedure:   ESI  Date of Surgery:  Clearance 03/12/23                                 Surgeon:  DR. Claudette Laws Surgeon's Group or Practice Name:  [facility] Phone number:  [phone] Fax number:  [phone]   Type of Clearance Requested:   - Medical  - Pharmacy:  Hold Apixaban (Eliquis) x 3 DAYS PRIOR   Type of Anesthesia:  Not Indicated   Additional requests/questions:    Elpidio Anis   01/29/2023, 6:22 PM

## 2023-01-30 NOTE — Telephone Encounter (Signed)
Patient with diagnosis of afib on Eliquis for anticoagulation.    Procedure: ESI  Date of procedure: 03/12/23   CHA2DS2-VASc Score = 8   This indicates a 10.8% annual risk of stroke. The patient's score is based upon: CHF History: 1 HTN History: 1 Diabetes History: 1 Stroke History: 2 Vascular Disease History: 1 Age Score: 2 Gender Score: 0      CrCl 63 ml/min Platelet count 148  Stroke in 1996, TIA in 2018 (subtherapeutic INR on warfarin at the time) Patient is at high risk off anticoagulation due to hx of stroke and high CHA2DS2VASc score previously held for 3 days for spinal procedure(06/19/2022)- approved by Dr. Anne Fu  Patient can hold Eliquis for 3 days prior to procedure.    **This guidance is not considered finalized until pre-operative APP has relayed final recommendations.**

## 2023-01-30 NOTE — Telephone Encounter (Signed)
   Name: [name]  DOB: 10/29/1945  MRN: [id number]  Primary Cardiologist: Donato Schultz, MD   Preoperative team, please contact this patient and set up a phone call appointment for further preoperative risk assessment. Please obtain consent and complete medication review. Thank you for your help.  I confirm that guidance regarding antiplatelet and oral anticoagulation therapy has been completed and, if necessary, noted below.  Patient can hold Eliquis for 3 days prior to procedure.  Please resume Eliquis as soon as possible postprocedure, at the discretion of the surgeon.    I also confirmed the patient resides in the state of [place]. As per [facility] Medical Board telemedicine laws, the patient must reside in the state in which the provider is licensed.   Joylene Grapes, NP 01/30/2023, 4:29 PM [facility]

## 2023-02-02 ENCOUNTER — Encounter: Payer: Self-pay | Admitting: *Deleted

## 2023-02-02 NOTE — Telephone Encounter (Signed)
Cannot get through the preferred # has pt has SMART CALL BLOCKING and the number is blocked. I did try the other # on file [phone] though phone just kept ringing and no vm came on. I will send FYI to requesting office the pt needs a televisit for pre op clearance and to please call our office [phone] and ask to s/w the pre op team.

## 2023-02-02 NOTE — Telephone Encounter (Signed)
My call was blocked as well. I sent him a Clinical cytogeneticist message.

## 2023-02-06 NOTE — Telephone Encounter (Addendum)
Attempted to reach pt but our number is still blocked. Unable to leave vm. Letter has been mailed to address on file notifying patient to call our office to schedule tele visit for preop clearance. Will remove from the pool until patient contacts our office

## 2023-02-19 DIAGNOSIS — L84 Corns and callosities: Secondary | ICD-10-CM | POA: Diagnosis not present

## 2023-02-19 DIAGNOSIS — I739 Peripheral vascular disease, unspecified: Secondary | ICD-10-CM | POA: Diagnosis not present

## 2023-02-19 DIAGNOSIS — E1142 Type 2 diabetes mellitus with diabetic polyneuropathy: Secondary | ICD-10-CM | POA: Diagnosis not present

## 2023-02-19 DIAGNOSIS — E1151 Type 2 diabetes mellitus with diabetic peripheral angiopathy without gangrene: Secondary | ICD-10-CM | POA: Diagnosis not present

## 2023-02-26 NOTE — Telephone Encounter (Signed)
I s/w the pt and he has been scheduled tele pre op appt 03/05/23. Med rec and consent are done. Best # to reach the pt he said is his phone # [phone]. I will update all parties involved.

## 2023-02-26 NOTE — Telephone Encounter (Signed)
Scheduler sent secure chat: this patient is calling to schedule his tele pre-op visit.   I was not available when the pt called the office. I tried to call the pt back. I called the [phone], call blocker on and cannot get through to the pt. I then call his other # on file [phone], though phone just kept ringing. I will let the GI office know that I have attempted to reach the pt back again, Our schedules are filling up quickly and he needs to be scheduled a tele appt prior to clearance.

## 2023-03-04 ENCOUNTER — Other Ambulatory Visit: Payer: Self-pay | Admitting: Internal Medicine

## 2023-03-05 ENCOUNTER — Ambulatory Visit: Payer: Medicare Other | Attending: Nurse Practitioner

## 2023-03-05 DIAGNOSIS — Z01810 Encounter for preprocedural cardiovascular examination: Secondary | ICD-10-CM

## 2023-03-05 NOTE — Progress Notes (Signed)
Virtual Visit via Telephone Note   Because of Keonte Salzer. January's co-morbid illnesses, he is at least at moderate risk for complications without adequate follow up.  This format is felt to be most appropriate for this patient at this time.  The patient did not have access to video technology/had technical difficulties with video requiring transitioning to audio format only (telephone).  All issues noted in this document were discussed and addressed.  No physical exam could be performed with this format.  Please refer to the patient's chart for his consent to telehealth for [facility].  Evaluation Performed:  Preoperative cardiovascular risk assessment _____________   Date:  03/05/2023   Patient ID:  [name], DOB 1945-05-30, MRN [id number] Patient Location:  Home Provider location:   Office  Primary Care Provider:  Sandford Craze, NP Primary Cardiologist:  Donato Schultz, MD  Chief Complaint / Patient Profile   77 y.o. 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  History of Present Illness    [name] is a 77 y.o. male who presents via audio/video conferencing for a telehealth visit today.  Pt was last seen in cardiology clinic on 06/05/2022 by Otilio Saber, PA and also seen by televisit for clearance on 07/08/2022 at that time [name] was doing well new cardiac changes. He was seen for follow-up in the AF clinic on 10/21/2022 and noted no interim symptoms of AF. The patient is now pending procedure as outlined above. Since his last visit, he has been doing well with no new cardiac complaints.  He denies chest  pain, shortness of breath, lower extremity edema, fatigue, palpitations, melena, hematuria, hemoptysis, diaphoresis, weakness, presyncope, syncope, orthopnea, and PND.    Past Medical History    Past Medical History:  Diagnosis Date   Acute encephalopathy 12/12/2015   Acute respiratory failure with hypoxia and hypercapnia (HCC) 12/12/2015   Allergic rhinitis 02/13/2010   Qualifier: Diagnosis of  By: Nena Jordan    Angina at rest [facility]) 10/22/2015   Arthritis    Ataxia 08/10/2013   Atrial fibrillation (HCC) 11/2018   B12 deficiency 08/11/2013   BACK PAIN, LUMBAR 01/29/2009   Qualifier: Diagnosis of  By: Nena Jordan    BENIGN POSITIONAL VERTIGO 11/23/2009   Qualifier: Diagnosis of  By: Nena Jordan    Chronic diastolic heart failure (HCC) 04/24/2015   Chronic pain    left sided-Kristeins   Chronically elevated hemidiaphragm    right   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/04/2015   s/p CABG 2004; CFX DES 2011   Elevated troponin    Essential hypertension 12/12/2006   Qualifier: Diagnosis of  By: Charlsie Quest RMA, Lucy     GERD 12/12/2006   Qualifier: Diagnosis of  By: Tyrone Apple, Lucy     GOUT 12/12/2006   Qualifier: Diagnosis of  By: Charlsie Quest RMA, Lucy     Hemorrhoids    Hyperlipidemia LDL goal <70 12/12/2006   Qualifier: Diagnosis of  By: Charlsie Quest RMA, Lucy      Hypogonadism male 04/10/2011   Hypothyroidism, acquired 02/15/2013   Insomnia 05/21/2013   Long term  current use of anticoagulant therapy 06/24/2011   Myocardial infarction [facility])    Obstructive sleep apnea-Failed CPAP 06/17/2007   Failed CPAP Using O 2 for sleep    Paroxysmal atrial fibrillation (HCC) 12/06/2016   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; resultant hemiplegia of dominant side   Thoracic aorta atherosclerosis (HCC) 04/27/2017   Tubular adenoma of colon 05/2010   Type 2 diabetes mellitus without complication, without long-term current use of insulin  (HCC) 10/05/2007   Qualifier: Diagnosis of  By: Nena Jordan    Vasovagal syncope    Vasovagal syncope    Wears glasses    Past Surgical History:  Procedure Laterality Date   CARDIAC CATHETERIZATION N/A 10/25/2015   Procedure: Left Heart Cath and Cors/Grafts Angiography;  Surgeon: Lennette Bihari, MD;  Location: [facility] INVASIVE CV LAB;  Service: Cardiovascular;  Laterality: N/A;   CARDIOVERSION  06/20/2011   Procedure: CARDIOVERSION;  Surgeon: Laurey Morale, MD;  Location: [facility] ENDOSCOPY;  Service: Cardiovascular;  Laterality: N/A;   CARDIOVERSION N/A 04/26/2015   Procedure: CARDIOVERSION;  Surgeon: Lars Masson, MD;  Location: [facility] ENDOSCOPY;  Service: Cardiovascular;  Laterality: N/A;   CARDIOVERSION N/A 05/10/2015   Procedure: CARDIOVERSION;  Surgeon: Laurey Morale, MD;  Location: [facility] ENDOSCOPY;  Service: Cardiovascular;  Laterality: N/A;   CARDIOVERSION N/A 12/10/2018   Procedure: CARDIOVERSION;  Surgeon: Thurmon Fair, MD;  Location: [facility] ENDOSCOPY;  Service: Cardiovascular;  Laterality: N/A;   CARPOMETACARPAL (CMC) FUSION OF THUMB Right 07/18/2022   Procedure: right carpo metacarpal suspensionplasty with abductor tendon transfer right wrist stenosing tenosynovitis release, right index carpo metacarpal boss excision;  Surgeon: Dairl Ponder, MD;  Location: [facility] OR;  Service: Orthopedics;  Laterality: Right;   CATARACT EXTRACTION  05/2010   left eye   CATARACT EXTRACTION  04/2010   right eye   COLONOSCOPY WITH PROPOFOL N/A 02/22/2020   Procedure: COLONOSCOPY WITH PROPOFOL;  Surgeon: Shellia Cleverly, DO;  Location: [facility] ENDOSCOPY;  Service: Gastroenterology;  Laterality: N/A;   CORONARY ARTERY BYPASS GRAFT     stent   DORSAL COMPARTMENT RELEASE Right 07/18/2022   Procedure: RELEASE DORSAL COMPARTMENT (DEQUERVAIN);  Surgeon: Dairl Ponder, MD;  Location: [facility] OR;  Service: Orthopedics;  Laterality: Right;  Anethesia Axillary block/ MAC   ESOPHAGOGASTRODUODENOSCOPY  03-25-2005   HEMORRHOID  SURGERY N/A 09/05/2021   Procedure: HEMORRHOIDECTOMY WITH LIGATION AND HEMORRHOIDOPEXY;  Surgeon: Karie Soda, MD;  Location: [facility] ORS;  Service: General;  Laterality: N/A;   LEFT HEART CATH AND CORS/GRAFTS ANGIOGRAPHY N/A 02/19/2018   Procedure: LEFT HEART CATH AND CORS/GRAFTS ANGIOGRAPHY;  Surgeon: Swaziland, Peter M, MD;  Location[facility] INVASIVE CV LAB;  Service: Cardiovascular;  Laterality: N/A;   NASAL SEPTUM SURGERY     PERCUTANEOUS PLACEMENT INTRAVASCULAR STENT CERVICAL CAROTID ARTERY     03-2009; using a drug-eluting platform of the circumflex cornoray artery with a 3.0 x 18 [employer] Promus drug-eluting platform post dilated to 3.75 with a noncompliant balloon.   POLYPECTOMY  02/22/2020   Procedure: POLYPECTOMY;  Surgeon: Shellia Cleverly, DO;  Location: [facility] ENDOSCOPY;  Service: Gastroenterology;;   RECTAL EXAM UNDER ANESTHESIA N/A 09/05/2021   Procedure: ANORECTAL EXAM UNDER ANESTHESIA;  Surgeon: Karie Soda, MD;  Location: [facility] ORS;  Service: General;  Laterality: N/A;   TEE WITHOUT CARDIOVERSION  06/20/2011   Procedure: TRANSESOPHAGEAL ECHOCARDIOGRAM (TEE);  Surgeon: Laurey Morale, MD;  Location: [facility] ENDOSCOPY;  Service: Cardiovascular;  Laterality: N/A;    Allergies  No Known Allergies  Home Medications    Prior to Admission medications   Medication Sig Start Date End Date Taking? Authorizing Provider  allopurinol (ZYLOPRIM) 100 MG tablet TAKE 2 TABLETS BY MOUTH EVERY DAY 11/17/22   Sandford Craze, NP  atorvastatin (LIPITOR) 40 MG tablet TAKE 1 TABLET BY MOUTH EVERY DAY 09/22/22   Sandford Craze, NP  benazepril (LOTENSIN) 10 MG tablet Take 1 tablet (10 mg total) by mouth daily. 09/16/22   Sandford Craze, NP  benazepril (LOTENSIN) 5 MG tablet Take 1 tablet (5 mg total) by mouth daily. Take with Benazepril 10 mg 10/23/22   Sandford Craze, NP  diltiazem (CARDIZEM CD) 180 MG 24 hr capsule Take 1 capsule (180 mg total) by mouth daily. 10/21/22 01/19/23  Fenton, Clint R, PA   dofetilide (TIKOSYN) 500 MCG capsule TAKE 1 CAPSULE BY MOUTH 2 TIMES DAILY. 10/15/22   Duke Salvia, MD  ELIQUIS 5 MG TABS tablet TAKE 1 TABLET BY MOUTH TWICE A DAY 12/30/22   Jake Bathe, MD  fluocinonide gel (LIDEX) 0.05 % Apply 1 Application topically 2 (two) times daily as needed. 11/26/22   Sandford Craze, NP  folic acid (FOLVITE) 1 MG tablet TAKE 1 TABLET BY MOUTH EVERY DAY 01/20/23   Sandford Craze, NP  gabapentin (NEURONTIN) 100 MG capsule TAKE 1 CAPSULE BY MOUTH EVERYDAY AT BEDTIME 09/27/22   Sandford Craze, NP  HYDROcodone-acetaminophen (NORCO/VICODIN) 5-325 MG tablet Take 1 tablet by mouth every 4 (four) hours as needed for moderate pain (pain score 4-6). 01/02/23   Gilda Crease, MD  levothyroxine (SYNTHROID) 112 MCG tablet TAKE 1 TABLET BY MOUTH DAILY BEFORE BREAKFAST. 01/04/23   Sandford Craze, NP  pantoprazole (PROTONIX) 40 MG tablet TAKE 1 TABLET BY MOUTH EVERY DAY BEFORE BREAKFAST 12/21/22   Sandford Craze, NP  traMADol (ULTRAM) 50 MG tablet TAKE 1 TABLET BY MOUTH TWICE A DAY 09/15/22   Kirsteins, Victorino Sparrow, MD  traMADol (ULTRAM-ER) 200 MG 24 hr tablet TAKE 1 TABLET BY MOUTH EVERY DAY 09/15/22   Kirsteins, Victorino Sparrow, MD    Physical Exam    Vital Signs:  [name] does not have vital signs available for review today.  Given telephonic nature of communication, physical exam is limited. AAOx3. NAD. Normal affect.  Speech and respirations are unlabored.  Accessory Clinical Findings    None  Assessment & Plan    1.  Preoperative Cardiovascular Risk Assessment: -Patient's RCRI score 6.6%  The patient affirms he has been doing well without any new cardiac symptoms. They are able to achieve 5 METS without cardiac limitations. Therefore, based on ACC/AHA guidelines, the patient would be at acceptable risk for the planned procedure without further cardiovascular testing. The patient was advised that if he develops new symptoms prior to surgery  to contact our office to arrange for a follow-up visit, and he verbalized understanding.   The patient was advised that if he develops new symptoms prior to surgery to contact our office to arrange for a follow-up visit, and he verbalized understanding.  Patient can hold Eliquis for 3 days prior to procedure.    A copy of this note will be routed to requesting surgeon.  Time:   Today, I have spent 6 minutes with the patient with telehealth technology discussing medical history, symptoms, and management plan.     Napoleon Form, Leodis Rains, NP  03/05/2023, 7:42 AM

## 2023-03-08 ENCOUNTER — Other Ambulatory Visit: Payer: Self-pay | Admitting: Family

## 2023-03-12 ENCOUNTER — Encounter: Payer: Medicare Other | Attending: Physical Medicine & Rehabilitation | Admitting: Physical Medicine & Rehabilitation

## 2023-03-12 ENCOUNTER — Encounter: Payer: Self-pay | Admitting: Physical Medicine & Rehabilitation

## 2023-03-12 VITALS — BP 154/79 | HR 66 | Temp 98.0°F | Ht 75.0 in | Wt 248.0 lb

## 2023-03-12 DIAGNOSIS — M5417 Radiculopathy, lumbosacral region: Secondary | ICD-10-CM | POA: Diagnosis not present

## 2023-03-12 MED ORDER — LIDOCAINE HCL (PF) 1 % IJ SOLN
2.0000 mL | Freq: Once | INTRAMUSCULAR | Status: AC
Start: 2023-03-12 — End: 2023-03-12
  Administered 2023-03-12: 2 mL

## 2023-03-12 MED ORDER — IOHEXOL 180 MG/ML  SOLN
3.0000 mL | Freq: Once | INTRAMUSCULAR | Status: AC
  Administered 2023-03-12: 3 mL via INTRAVENOUS

## 2023-03-12 MED ORDER — DEXAMETHASONE SODIUM PHOSPHATE 10 MG/ML IJ SOLN
10.0000 mg | Freq: Once | INTRAMUSCULAR | Status: AC
Start: 2023-03-12 — End: 2023-03-12
  Administered 2023-03-12: 10 mg via INTRAVENOUS

## 2023-03-12 MED ORDER — LIDOCAINE HCL 1 % IJ SOLN
5.0000 mL | Freq: Once | INTRAMUSCULAR | Status: AC
Start: 2023-03-12 — End: 2023-03-12
  Administered 2023-03-12: 5 mL

## 2023-03-12 NOTE — Patient Instructions (Signed)
Resume Eliquis tomorrow 03/13/23  You received an epidural steroid injection under fluoroscopic guidance. This is the most accurate way to perform an epidural injection. This injection was performed to relieve thigh or leg or foot pain that may be related to a pinched nerve in the lumbar spine. The local anesthetic injected today may cause numbness in your leg for a couple hours. If it is severe we may need to observe you for 30-60 minutes after the injection. The cortisone medicine injected today may take several days to take full effect. This medicine can also cause facial flushing or feeling of being warm.  This injection may last for days weeks or months. It can be repeated if needed. If it is not effective, another spinal level may need to be injected. Other treatments include medication management as well as physical therapy. In some cases surgery may be an option.

## 2023-03-12 NOTE — Progress Notes (Signed)
  PROCEDURE RECORD [facility]   Name: [name] DOB:1945/09/14 MRN: [id number]  Date:03/12/2023  Physician: Claudette Laws, MD    Nurse/CMA: Tyiana Hill RMA   Allergies: No Known Allergies  Consent Signed: Yes.    Is patient diabetic? No.  CBG today? .  Pregnant: No. LMP: No LMP for male patient. (age 77-55)  Anticoagulants: yes (Eliquis, held for 3 days) Anti-inflammatory: no Antibiotics: no  Procedure: Left L4-5 Transforaminal Epidural   Position: Prone Start Time: 2:18 PM  End Time: 2:28 PM  Fluoro Time: 41  RN/CMA Elizar Alpern RMA Jaleeya Mcnelly RMA    Time 2:02 PM 2:32 PM    BP 154/79 149/85    Pulse 66 65    Respirations 16 16    O2 Sat 93 93    S/S 6 6    Pain Level 5/10 4/10     D/C home with Wife [name], patient A & O X 3, D/C instructions reviewed, and sits independently.

## 2023-03-12 NOTE — Progress Notes (Signed)
Left L4-5 Lumbar transforaminal epidural steroid injection under fluoroscopic guidance with contrast enhancement  Indication: Bilateral Lumbosacral radiculitis  was worse on Right last month now worse on left , MRI reviewed Bilateral stenosis multilevel , L4 is most symptomatic I(not relieved by medication management tramadol , not able to take NSAID or other conservative care and interfering with self-care and mobility). Pt indicates he has been off Eliquis x 3 d)   Informed consent was obtained after describing risk and benefits of the procedure with the patient, this includes bleeding, bruising, infection, paralysis and medication side effects.  The patient wishes to proceed and has given written consent.  Patient was placed in prone position.  The lumbar area was marked and prepped with Betadine.  It was entered with a 25-gauge 1-1/2 inch needle and one mL of 1% lidocaine was injected into the skin and subcutaneous tissue.  Then a 22-gauge 5in spinal needle was inserted into the Left L4-5  intervertebral foramen under AP, lateral, and oblique view.  Once needle tip was within the foramen on lateral views an dnor exceeding 6 o clock position on th epedical on AP viewed Isovue 200 was inected x 2ml Then a solution containing one mL of 10 mg per mL dexamethasone and 2 mL of 1% lidocaine was injected.  Due to foraminal stenosis , the inferior aspect of the foramen was targeted.  The patient tolerated procedure well.  Post procedure instructions were given.  Please see post procedure form.

## 2023-03-16 ENCOUNTER — Other Ambulatory Visit: Payer: Self-pay | Admitting: Family

## 2023-03-16 ENCOUNTER — Other Ambulatory Visit: Payer: Self-pay | Admitting: Physical Medicine & Rehabilitation

## 2023-03-16 ENCOUNTER — Other Ambulatory Visit: Payer: Self-pay | Admitting: Internal Medicine

## 2023-03-16 DIAGNOSIS — Z8639 Personal history of other endocrine, nutritional and metabolic disease: Secondary | ICD-10-CM

## 2023-03-16 DIAGNOSIS — I10 Essential (primary) hypertension: Secondary | ICD-10-CM

## 2023-03-30 ENCOUNTER — Ambulatory Visit: Payer: Medicare Other | Admitting: Family

## 2023-03-30 ENCOUNTER — Other Ambulatory Visit: Payer: Self-pay | Admitting: Family

## 2023-04-02 NOTE — Telephone Encounter (Signed)
Okay to hold given. Per Cardiology follow up visit. Injection given.

## 2023-04-03 ENCOUNTER — Ambulatory Visit: Payer: Medicare Other | Admitting: Family

## 2023-04-10 ENCOUNTER — Ambulatory Visit: Payer: Medicare Other | Admitting: Family

## 2023-04-22 ENCOUNTER — Ambulatory Visit: Payer: Self-pay | Admitting: Family

## 2023-04-22 DIAGNOSIS — H43812 Vitreous degeneration, left eye: Secondary | ICD-10-CM | POA: Diagnosis not present

## 2023-04-22 DIAGNOSIS — E113293 Type 2 diabetes mellitus with mild nonproliferative diabetic retinopathy without macular edema, bilateral: Secondary | ICD-10-CM | POA: Diagnosis not present

## 2023-04-22 DIAGNOSIS — H35373 Puckering of macula, bilateral: Secondary | ICD-10-CM | POA: Diagnosis not present

## 2023-04-22 NOTE — Telephone Encounter (Signed)
  Chief Complaint: abdominal pain  Symptoms: mild abdominal pain, constipation Frequency: 4 days Pertinent Negatives: Patient denies fever, vomiting,  Disposition: [] ED /[] Urgent Care (no appt availability in office) / [x] Appointment(In office/virtual)/ []  [facility]/ [] Home Care/ [] Refused Recommended Disposition /[] [employer] [facility]/ []  Follow-up with PCP Additional Notes: Patient reports that for the past 4 days he has been experiencing mild abdominal pain that comes and goes and is present in the middle of his abdomen. Patient reports he has been constipation for the last 4 days and feels like this might be the cause. Per protocol, appt scheduled in office 2/6. Patient advised to call back with worsening symptoms. Patient verbalized understanding.   Copied from CRM [id number]. Topic: Clinical - Red Word Triage >> Apr 22, 2023  2:21 PM Tiffany H wrote: RED WORD: Constipation/Pain.   Patient advised that he's having trouble in his abdominal area. He advised that he has pain all the way across his lower back. Patient originally asked for an acute appointment.   Please assist. Reason for Disposition  [1] MILD pain (e.g., does not interfere with normal activities) AND [2] pain comes and goes (cramps) [3] present > 48 hours  (Exception: This same abdominal pain is a chronic symptom recurrent or ongoing AND present > 4 weeks.)  Answer Assessment - Initial Assessment Questions 1. LOCATION: Where does it hurt?      middle 2. RADIATION: Does the pain shoot anywhere else? (e.g., chest, back)     Lower back 3. ONSET: When did the pain begin? (Minutes, hours or days ago)      4 days 4. SUDDEN: Gradual or sudden onset?     gradual 5. PATTERN Does the pain come and go, or is it constant?    - If it comes and goes: How long does it last? Do you have pain now?     (Note: Comes and goes means the pain is intermittent. It goes away completely between bouts.)    - If  constant: Is it getting better, staying the same, or getting worse?      (Note: Constant means the pain never goes away completely; most serious pain is constant and gets worse.)      Comes and goes 6. SEVERITY: How bad is the pain?  (e.g., Scale 1-10; mild, moderate, or severe)    - MILD (1-3): Doesn't interfere with normal activities, abdomen soft and not tender to touch.     - MODERATE (4-7): Interferes with normal activities or awakens from sleep, abdomen tender to touch.     - SEVERE (8-10): Excruciating pain, doubled over, unable to do any normal activities.       mild 7. RECURRENT SYMPTOM: Have you ever had this type of stomach pain before? If Yes, ask: When was the last time? and What happened that time?      no 8. CAUSE: What do you think is causing the stomach pain?     I think it may be related to my constipation or a blockage 9. RELIEVING/AGGRAVATING FACTORS: What makes it better or worse? (e.g., antacids, bending or twisting motion, bowel movement)     none 10. OTHER SYMPTOMS: Do you have any other symptoms? (e.g., back pain, diarrhea, fever, urination pain, vomiting)       Constipation  Protocols used: Abdominal Pain - Male-A-AH

## 2023-04-22 NOTE — Progress Notes (Signed)
 Established patient visit   Patient: [name]   DOB: August 26, 1945   77 y.o. Male  MRN: [id number] Visit Date: 04/23/2023  Today's healthcare provider: Manuelita Flatness, PA-C   Cc. Constipation, abdominal pain  Subjective     Pt reports constipation, lower abdominal pain for the last few days. He reports his last BM was Monday x 3 days ago, and was normal for him. He has been taking fiber supplements without relief. Denies urinary symptoms.   Medications: Outpatient Medications Prior to Visit  Medication Sig   allopurinol  (ZYLOPRIM ) 100 MG tablet Take 2 tablets (200 mg total) by mouth daily.   atorvastatin  (LIPITOR ) 40 MG tablet Take 1 tablet (40 mg total) by mouth daily.   benazepril  (LOTENSIN ) 10 MG tablet TAKE 1 TABLET BY MOUTH EVERY DAY   benazepril  (LOTENSIN ) 5 MG tablet Take 1 tablet (5 mg total) by mouth daily. Take with Benazepril  10 mg   dofetilide  (TIKOSYN ) 500 MCG capsule Take 1 capsule (500 mcg total) by mouth 2 (two) times daily. Please call [phone] to schedule a March appointment with Dr. Fernande for future refills. Thank you.   ELIQUIS  5 MG TABS tablet TAKE 1 TABLET BY MOUTH TWICE A DAY   fluocinonide  gel (LIDEX ) 0.05 % Apply 1 Application topically 2 (two) times daily as needed.   folic acid  (FOLVITE ) 1 MG tablet TAKE 1 TABLET BY MOUTH EVERY DAY   gabapentin  (NEURONTIN ) 100 MG capsule Take 1 capsule (100 mg total) by mouth at bedtime.   HYDROcodone -acetaminophen  (NORCO/VICODIN) 5-325 MG tablet Take 1 tablet by mouth every 4 (four) hours as needed for moderate pain (pain score 4-6).   levothyroxine  (SYNTHROID ) 112 MCG tablet Take 1 tablet (112 mcg total) by mouth daily before breakfast.   pantoprazole  (PROTONIX ) 40 MG tablet Take 1 tablet (40 mg total) by mouth daily before breakfast.   traMADol  (ULTRAM ) 50 MG tablet TAKE 1 TABLET BY MOUTH TWICE A DAY   traMADol  (ULTRAM -ER) 200 MG 24 hr tablet TAKE 1 TABLET BY MOUTH EVERY DAY   diltiazem  (CARDIZEM  CD) 180  MG 24 hr capsule Take 1 capsule (180 mg total) by mouth daily.   No facility-administered medications prior to visit.    Review of Systems  Constitutional:  Negative for fatigue and fever.  Respiratory:  Negative for cough and shortness of breath.   Cardiovascular:  Negative for chest pain, palpitations and leg swelling.  Neurological:  Negative for dizziness and headaches.       Objective    BP 139/87   Pulse 71   Temp 98.1 F (36.7 C) (Oral)   Ht 6' 3 (1.905 m)   Wt 249 lb 6 oz (113.1 kg)   SpO2 96%   BMI 31.17 kg/m    Physical Exam Vitals reviewed.  Constitutional:      Appearance: He is not ill-appearing.  HENT:     Head: Normocephalic.  Eyes:     Conjunctiva/sclera: Conjunctivae normal.  Cardiovascular:     Rate and Rhythm: Normal rate.  Pulmonary:     Effort: Pulmonary effort is normal. No respiratory distress.  Abdominal:     Palpations: Abdomen is rigid.     Tenderness: There is no abdominal tenderness.  Neurological:     General: No focal deficit present.     Mental Status: He is alert and oriented to person, place, and time.  Psychiatric:        Mood and Affect: Mood normal.  Behavior: Behavior normal.      No results found for any visits on 04/23/23.  Assessment & Plan    Constipation, unspecified constipation type -     DG Abd 2 Views  Abdominal pain, unspecified abdominal location -     DG Abd 2 Views   Advised adding miralax  into his diet, BID dosing until resumes more normal BM schedule.  Ordering abd xray to assess stool burden d/t firmness of abdomen on exam-- exam was done w/ pt sitting as he could not ambulate onto table.  Return if symptoms worsen or fail to improve.       Manuelita Flatness, PA-C  [facility] [phone] (phone) [phone] (fax)  [facility]

## 2023-04-23 ENCOUNTER — Encounter: Payer: Self-pay | Admitting: Physician Assistant

## 2023-04-23 ENCOUNTER — Ambulatory Visit (INDEPENDENT_AMBULATORY_CARE_PROVIDER_SITE_OTHER): Payer: Medicare Other | Admitting: Physician Assistant

## 2023-04-23 ENCOUNTER — Ambulatory Visit (HOSPITAL_BASED_OUTPATIENT_CLINIC_OR_DEPARTMENT_OTHER)
Admission: RE | Admit: 2023-04-23 | Discharge: 2023-04-23 | Disposition: A | Discharge status: Home / Self Care | Payer: Medicare Other | Source: Ambulatory Visit | Attending: Physician Assistant | Admitting: Physician Assistant

## 2023-04-23 VITALS — BP 139/87 | HR 71 | Temp 98.1°F | Ht 75.0 in | Wt 249.4 lb

## 2023-04-23 DIAGNOSIS — R109 Unspecified abdominal pain: Secondary | ICD-10-CM | POA: Insufficient documentation

## 2023-04-23 DIAGNOSIS — K5900 Constipation, unspecified: Secondary | ICD-10-CM

## 2023-04-27 ENCOUNTER — Ambulatory Visit: Payer: Self-pay | Admitting: Family

## 2023-04-27 NOTE — Telephone Encounter (Signed)
 Summary: bowel issues/medication   Copied From CRM [id number]. Reason for Triage: patient was at the doctor's last week and was given a medication to assist with his bowels. He is now in need of that medication but isn't quite sure of the name of the medication and would like to speak to someone regardless.   Chief Complaint: Abdominal pain Symptoms: Abdominal pain Frequency: About 2 weeks Pertinent Negatives: Patient denies diarrhea and vomiting Disposition: [] ED /[] Urgent Care (no appt availability in office) / [x] Appointment(In office/virtual)/ []  [facility]/ [] Home Care/ [x] Refused Recommended Disposition /[] [facility]/ []  Follow-up with PCP Additional Notes: Patient called in to report abdominal pain. Patient was seen in the office on 04/23/23 and treated for constipation. Patient stated that he is now having regular bowel movements, but the abdominal pain won't go away. Patient stated eating food makes the pain worse. Patient denied vomiting and diarrhea. Patient asked if he could talk to his provider about the medication, Tremfya because he thinks it will help his symptoms. This RN advised patient to be seen in the office again, due to no relief from abdominal pain. Patient declined appointment and stated that he wanted a call back from someone in the office. This RN advised that I would route this conversation to the office, for their discretion.   Reason for Disposition  Age > 60 years  Answer Assessment - Initial Assessment Questions 1. LOCATION: "Where does it hurt?"      Across navel 2. RADIATION: "Does the pain shoot anywhere else?" (e.g., chest, back)     Only present in abdomen 3. ONSET: "When did the pain begin?" (Minutes, hours or days ago)      A couple of weeks 4. SUDDEN: "Gradual or sudden onset?"     Pain onset with constipation a couple of weeks ago 5. PATTERN "Does the pain come and go, or is it constant?"    - If it comes and goes: "How long does  it last?" "Do you have pain now?"     (Note: Comes and goes means the pain is intermittent. It goes away completely between bouts.)    - If constant: "Is it getting better, staying the same, or getting worse?"      (Note: Constant means the pain never goes away completely; most serious pain is constant and gets worse.)      Constant 6. SEVERITY: "How bad is the pain?"  (e.g., Scale 1-10; mild, moderate, or severe)    - MILD (1-3): Doesn't interfere with normal activities, abdomen soft and not tender to touch.     - MODERATE (4-7): Interferes with normal activities or awakens from sleep, abdomen tender to touch.     - SEVERE (8-10): Excruciating pain, doubled over, unable to do any normal activities.       N/A 7. RECURRENT SYMPTOM: "Have you ever had this type of stomach pain before?" If Yes, ask: "When was the last time?" and "What happened that time?"      N/A 8. CAUSE: "What do you think is causing the stomach pain?"     States abdominal pain may have something to do with what he is eating 9. RELIEVING/AGGRAVATING FACTORS: "What makes it better or worse?" (e.g., antacids, bending or twisting motion, bowel movement)     Eating food makes pain worse 10. OTHER SYMPTOMS: "Do you have any other symptoms?" (e.g., back pain, diarrhea, fever, urination pain, vomiting)       Denies diarrhea, denies vomiting  Protocols used: Abdominal Pain - Male-A-AH

## 2023-04-28 ENCOUNTER — Telehealth: Payer: Self-pay

## 2023-04-28 NOTE — Telephone Encounter (Signed)
Copied from CRM [id number]. Topic: Clinical - Lab/Test Results >> Apr 28, 2023 12:30 PM Pascal Lux wrote: Reason for CRM: Patient called to speak to his provider or nurse regarding his lab results and stated he has aggravation with his stomach.

## 2023-04-29 NOTE — Telephone Encounter (Signed)
Called patient several times, his number has a block and he does not answer. No voice mail option available either. Hopefully he will see the missed calls and call us back.  Needs appointment for follow up abdomina pain and constipation

## 2023-04-29 NOTE — Telephone Encounter (Signed)
I would recommend follow up appointment please.

## 2023-04-29 NOTE — Telephone Encounter (Signed)
Called patient again but no answer

## 2023-04-30 ENCOUNTER — Encounter: Payer: Medicare Other | Attending: Physical Medicine & Rehabilitation | Admitting: Physical Medicine & Rehabilitation

## 2023-04-30 DIAGNOSIS — G8110 Spastic hemiplegia affecting unspecified side: Secondary | ICD-10-CM | POA: Insufficient documentation

## 2023-04-30 DIAGNOSIS — I69352 Hemiplegia and hemiparesis following cerebral infarction affecting left dominant side: Secondary | ICD-10-CM | POA: Insufficient documentation

## 2023-05-01 ENCOUNTER — Other Ambulatory Visit: Payer: Self-pay | Admitting: Family

## 2023-05-01 ENCOUNTER — Ambulatory Visit (INDEPENDENT_AMBULATORY_CARE_PROVIDER_SITE_OTHER): Payer: Medicare Other | Admitting: Family

## 2023-05-01 VITALS — BP 131/64 | HR 81 | Temp 98.5°F | Resp 16 | Ht 75.0 in | Wt 250.0 lb

## 2023-05-01 DIAGNOSIS — R1033 Periumbilical pain: Secondary | ICD-10-CM | POA: Diagnosis not present

## 2023-05-01 DIAGNOSIS — K5900 Constipation, unspecified: Secondary | ICD-10-CM | POA: Diagnosis not present

## 2023-05-01 NOTE — Progress Notes (Signed)
Subjective:     Patient ID: [name], male    DOB: 1945/05/29, 78 y.o.   MRN: [id number]  Chief Complaint  Patient presents with   Abdominal Pain    Patient complains of still having abdominal pain    HPI  Discussed the use of AI scribe software for clinical note transcription with the patient, who gave verbal consent to proceed.  History of Present Illness   [name] is an 78 year old male who presents with abdominal pain and constipation.  He has persistent abdominal pain that is aggravated by eating, described as a small amount of pain across the umbilical region. Despite dietary changes, the pain remains irritating and persistent. No bulging around the umbilicus suggestive of a hernia and very little pain upon palpation of the area.  He has a history of constipation, with an inability to have a bowel movement for about one and a half to two weeks. Currently, bowel movements occur every day or day and a half and are difficult to pass. Miralax has been used but did not significantly alleviate the pain or improve bowel regularity. He mentions a past comment about having a 'very, very lazy colon.'  Approximately three weeks ago, he fell and landed on his tailbone, causing discomfort for about two and a half weeks. The pain from the fall has since resolved.          Health Maintenance Due  Topic Date Due   OPHTHALMOLOGY EXAM  10/16/2014   Medicare Annual Wellness (AWV)  05/21/2017   COVID-19 Vaccine (3 - Pfizer risk series) 06/29/2019   Diabetic kidney evaluation - Urine ACR  05/27/2023    Past Medical History:  Diagnosis Date   Acute encephalopathy 12/12/2015   Acute respiratory failure with hypoxia and hypercapnia (HCC) 12/12/2015   Allergic rhinitis 02/13/2010   Qualifier: Diagnosis of  By: Nena Jordan    Angina at rest [facility]) 10/22/2015   Arthritis    Ataxia 08/10/2013   Atrial fibrillation (HCC) 11/2018   B12 deficiency 08/11/2013   BACK  PAIN, LUMBAR 01/29/2009   Qualifier: Diagnosis of  By: Nena Jordan    BENIGN POSITIONAL VERTIGO 11/23/2009   Qualifier: Diagnosis of  By: Nena Jordan    Chronic diastolic heart failure (HCC) 04/24/2015   Chronic pain    left sided-Kristeins   Chronically elevated hemidiaphragm    right   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/04/2015   s/p CABG 2004; CFX DES 2011   Elevated troponin    Essential hypertension 12/12/2006   Qualifier: Diagnosis of  By: Charlsie Quest RMA, Lucy     GERD 12/12/2006   Qualifier: Diagnosis of  By: Tyrone Apple, Lucy     GOUT 12/12/2006   Qualifier: Diagnosis of  By: Charlsie Quest RMA, Lucy     Hemorrhoids    Hyperlipidemia LDL goal <70 12/12/2006   Qualifier: Diagnosis of  By: Charlsie Quest RMA, Lucy      Hypogonadism male 04/10/2011   Hypothyroidism, acquired 02/15/2013   Insomnia 05/21/2013   Long term current use of anticoagulant therapy 06/24/2011   Myocardial infarction (HCC)    Obstructive sleep apnea-Failed CPAP 06/17/2007   Failed CPAP Using O 2 for sleep    Paroxysmal atrial fibrillation (HCC) 12/06/2016   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; resultant hemiplegia of dominant side   Thoracic aorta atherosclerosis (HCC) 04/27/2017   Tubular adenoma of colon  05/2010   Type 2 diabetes mellitus without complication, without long-term current use of insulin (HCC) 10/05/2007   Qualifier: Diagnosis of  By: Nena Jordan    Vasovagal syncope    Vasovagal syncope    Wears glasses     Past Surgical History:  Procedure Laterality Date   CARDIAC CATHETERIZATION N/A 10/25/2015   Procedure: Left Heart Cath and Cors/Grafts Angiography;  Surgeon: Lennette Bihari, MD;  Location: [facility] INVASIVE CV LAB;  Service: Cardiovascular;  Laterality: N/A;   CARDIOVERSION  06/20/2011   Procedure: CARDIOVERSION;  Surgeon: Laurey Morale, MD;  Location: [facility] ENDOSCOPY;  Service: Cardiovascular;  Laterality: N/A;   CARDIOVERSION N/A  04/26/2015   Procedure: CARDIOVERSION;  Surgeon: Lars Masson, MD;  Location: [facility] ENDOSCOPY;  Service: Cardiovascular;  Laterality: N/A;   CARDIOVERSION N/A 05/10/2015   Procedure: CARDIOVERSION;  Surgeon: Laurey Morale, MD;  Location: [facility] ENDOSCOPY;  Service: Cardiovascular;  Laterality: N/A;   CARDIOVERSION N/A 12/10/2018   Procedure: CARDIOVERSION;  Surgeon: Thurmon Fair, MD;  Location: [facility] ENDOSCOPY;  Service: Cardiovascular;  Laterality: N/A;   CARPOMETACARPAL (CMC) FUSION OF THUMB Right 07/18/2022   Procedure: right carpo metacarpal suspensionplasty with abductor tendon transfer right wrist stenosing tenosynovitis release, right index carpo metacarpal boss excision;  Surgeon: Dairl Ponder, MD;  Location: [facility] OR;  Service: Orthopedics;  Laterality: Right;   CATARACT EXTRACTION  05/2010   left eye   CATARACT EXTRACTION  04/2010   right eye   COLONOSCOPY WITH PROPOFOL N/A 02/22/2020   Procedure: COLONOSCOPY WITH PROPOFOL;  Surgeon: Shellia Cleverly, DO;  Location: [facility] ENDOSCOPY;  Service: Gastroenterology;  Laterality: N/A;   CORONARY ARTERY BYPASS GRAFT     stent   DORSAL COMPARTMENT RELEASE Right 07/18/2022   Procedure: RELEASE DORSAL COMPARTMENT (DEQUERVAIN);  Surgeon: Dairl Ponder, MD;  Location: [facility] OR;  Service: Orthopedics;  Laterality: Right;  Anethesia Axillary block/ MAC   ESOPHAGOGASTRODUODENOSCOPY  03-25-2005   HEMORRHOID SURGERY N/A 09/05/2021   Procedure: HEMORRHOIDECTOMY WITH LIGATION AND HEMORRHOIDOPEXY;  Surgeon: Karie Soda, MD;  Location: [facility] ORS;  Service: General;  Laterality: N/A;   LEFT HEART CATH AND CORS/GRAFTS ANGIOGRAPHY N/A 02/19/2018   Procedure: LEFT HEART CATH AND CORS/GRAFTS ANGIOGRAPHY;  Surgeon: Swaziland, Peter M, MD;  Location: [facility] INVASIVE CV LAB;  Service: Cardiovascular;  Laterality: N/A;   NASAL SEPTUM SURGERY     PERCUTANEOUS PLACEMENT INTRAVASCULAR STENT CERVICAL CAROTID ARTERY     03-2009; using a drug-eluting platform of the circumflex cornoray  artery with a 3.0 x 18 [employer] Promus drug-eluting platform post dilated to 3.75 with a noncompliant balloon.   POLYPECTOMY  02/22/2020   Procedure: POLYPECTOMY;  Surgeon: Shellia Cleverly, DO;  Location: [facility] ENDOSCOPY;  Service: Gastroenterology;;   RECTAL EXAM UNDER ANESTHESIA N/A 09/05/2021   Procedure: ANORECTAL EXAM UNDER ANESTHESIA;  Surgeon: Karie Soda, MD;  Location: [facility] ORS;  Service: General;  Laterality: N/A;   TEE WITHOUT CARDIOVERSION  06/20/2011   Procedure: TRANSESOPHAGEAL ECHOCARDIOGRAM (TEE);  Surgeon: Laurey Morale, MD;  Location: [facility] ENDOSCOPY;  Service: Cardiovascular;  Laterality: N/A;    Family History  Problem Relation Age of Onset   Lung cancer Father        deceased   Stroke Mother        deceased-MINISTROKES   Colon cancer Neg Hx    Esophageal cancer Neg Hx    Stomach cancer Neg Hx    Pancreatic cancer Neg Hx    Liver disease Neg Hx  Social History   Socioeconomic History   Marital status: Married    Spouse name: [name]   Number of children: Not on file   Years of education: 14   Highest education level: Not on file  Occupational History   Occupation: retired    Associate Professor: OTHER    Comment: Physicist, medical  Tobacco Use   Smoking status: Never   Smokeless tobacco: Never  Vaping Use   Vaping status: Never Used  Substance and Sexual Activity   Alcohol use: Not Currently   Drug use: No   Sexual activity: Not Currently  Other Topics Concern   Not on file  Social History Narrative   Pt lives with wife. Does have stairs, but patient doesn't use them. Pt has completed technical school   Social Drivers of Health   Financial Resource Strain: Not on file  Food Insecurity: Not on file  Transportation Needs: Not on file  Physical Activity: Not on file  Stress: Not on file  Social Connections: Not on file  Intimate Partner Violence: Not on file    Outpatient Medications Prior to Visit  Medication Sig Dispense Refill   allopurinol  (ZYLOPRIM) 100 MG tablet TAKE 2 TABLETS BY MOUTH EVERY DAY 180 tablet 0   atorvastatin (LIPITOR) 40 MG tablet Take 1 tablet (40 mg total) by mouth daily. 90 tablet 0   benazepril (LOTENSIN) 10 MG tablet TAKE 1 TABLET BY MOUTH EVERY DAY 90 tablet 1   benazepril (LOTENSIN) 5 MG tablet Take 1 tablet (5 mg total) by mouth daily. Take with Benazepril 10 mg 90 tablet 0   dofetilide (TIKOSYN) 500 MCG capsule Take 1 capsule (500 mcg total) by mouth 2 (two) times daily. Please call [phone] to schedule a March appointment with Dr. Graciela Husbands for future refills. Thank you. 180 capsule 0   ELIQUIS 5 MG TABS tablet TAKE 1 TABLET BY MOUTH TWICE A DAY 180 tablet 1   fluocinonide gel (LIDEX) 0.05 % Apply 1 Application topically 2 (two) times daily as needed. 60 g 1   folic acid (FOLVITE) 1 MG tablet TAKE 1 TABLET BY MOUTH EVERY DAY 90 tablet 1   gabapentin (NEURONTIN) 100 MG capsule Take 1 capsule (100 mg total) by mouth at bedtime. 90 capsule 0   HYDROcodone-acetaminophen (NORCO/VICODIN) 5-325 MG tablet Take 1 tablet by mouth every 4 (four) hours as needed for moderate pain (pain score 4-6). 10 tablet 0   levothyroxine (SYNTHROID) 112 MCG tablet Take 1 tablet (112 mcg total) by mouth daily before breakfast. 90 tablet 0   pantoprazole (PROTONIX) 40 MG tablet Take 1 tablet (40 mg total) by mouth daily before breakfast. 90 tablet 0   traMADol (ULTRAM) 50 MG tablet TAKE 1 TABLET BY MOUTH TWICE A DAY 180 tablet 1   traMADol (ULTRAM-ER) 200 MG 24 hr tablet TAKE 1 TABLET BY MOUTH EVERY DAY 90 tablet 1   diltiazem (CARDIZEM CD) 180 MG 24 hr capsule Take 1 capsule (180 mg total) by mouth daily. 90 capsule 2   No facility-administered medications prior to visit.    No Known Allergies  ROS    See HPI Objective:    Physical Exam Constitutional:      General: He is not in acute distress.    Appearance: He is well-developed.  HENT:     Head: Normocephalic and atraumatic.  Cardiovascular:     Rate and Rhythm:  Normal rate and regular rhythm.     Heart sounds: No murmur heard. Pulmonary:  Effort: Pulmonary effort is normal. No respiratory distress.     Breath sounds: Normal breath sounds. No wheezing or rales.  Abdominal:     General: Bowel sounds are normal.     Tenderness: There is no abdominal tenderness. There is no guarding or rebound.  Skin:    General: Skin is warm and dry.  Neurological:     Mental Status: He is alert and oriented to person, place, and time.  Psychiatric:        Behavior: Behavior normal.        Thought Content: Thought content normal.      BP 131/64 (BP Location: Right Arm, Patient Position: Sitting, Cuff Size: Large)   Pulse 81   Temp 98.5 F (36.9 C) (Oral)   Resp 16   Ht 6\' 3"  (1.905 m)   Wt 250 lb (113.4 kg)   SpO2 95%   BMI 31.25 kg/m  Wt Readings from Last 3 Encounters:  05/01/23 250 lb (113.4 kg)  04/23/23 249 lb 6 oz (113.1 kg)  03/12/23 248 lb (112.5 kg)       Assessment & Plan:   Problem List Items Addressed This Visit       Unprioritized   Periumbilical abdominal pain - Primary    Persistent pain across the abdomen, particularly after eating. No evidence of hernia on physical examination. Patient reports a history of a "lazy colon". -Order liver function tests. -Order an ultrasound to evaluate the gallbladder. -Recommend a low FODMAP diet to reduce potential dietary triggers. -Refer to a gastroenterologist for further evaluation if symptoms persist.      Relevant Orders   Lipase (Completed)   CBC w/Diff (Completed)   Hepatic function panel (Completed)   US Abdomen Limited RUQ (LIVER/GB)   Ambulatory referral to Gastroenterology   Constipation    Infrequent bowel movements, sometimes going a day or a day and a half without a bowel movement. Patient has been using Miralax with limited success. -Continue Miralax daily to promote regular bowel movements.       I am having [name] maintain his diltiazem,  fluocinonide gel, Eliquis, HYDROcodone-acetaminophen, folic acid, benazepril, atorvastatin, levothyroxine, traMADol, gabapentin, benazepril, dofetilide, traMADol, pantoprazole, and allopurinol.  No orders of the defined types were placed in this encounter.

## 2023-05-02 DIAGNOSIS — R1033 Periumbilical pain: Secondary | ICD-10-CM | POA: Insufficient documentation

## 2023-05-02 LAB — HEPATIC FUNCTION PANEL
AG Ratio: 2 (calc) (ref 1.0–2.5)
ALT: 37 U/L (ref 9–46)
AST: 30 U/L (ref 10–35)
Albumin: 4.6 g/dL (ref 3.6–5.1)
Alkaline phosphatase (APISO): 85 U/L (ref 35–144)
Bilirubin, Direct: 0.1 mg/dL (ref 0.0–0.2)
Globulin: 2.3 g/dL (ref 1.9–3.7)
Indirect Bilirubin: 0.4 mg/dL (ref 0.2–1.2)
Total Bilirubin: 0.5 mg/dL (ref 0.2–1.2)
Total Protein: 6.9 g/dL (ref 6.1–8.1)

## 2023-05-02 LAB — CBC WITH DIFFERENTIAL/PLATELET
Absolute Lymphocytes: 1956 {cells}/uL (ref 850–3900)
Absolute Monocytes: 510 {cells}/uL (ref 200–950)
Basophils Absolute: 108 {cells}/uL (ref 0–200)
Basophils Relative: 1.8 %
Eosinophils Absolute: 450 {cells}/uL (ref 15–500)
Eosinophils Relative: 7.5 %
HCT: 40.6 % (ref 38.5–50.0)
Hemoglobin: 14 g/dL (ref 13.2–17.1)
MCH: 31.6 pg (ref 27.0–33.0)
MCHC: 34.5 g/dL (ref 32.0–36.0)
MCV: 91.6 fL (ref 80.0–100.0)
MPV: 11.7 fL (ref 7.5–12.5)
Monocytes Relative: 8.5 %
Neutro Abs: 2976 {cells}/uL (ref 1500–7800)
Neutrophils Relative %: 49.6 %
Platelets: 147 10*3/uL (ref 140–400)
RBC: 4.43 10*6/uL (ref 4.20–5.80)
RDW: 12.2 % (ref 11.0–15.0)
Total Lymphocyte: 32.6 %
WBC: 6 10*3/uL (ref 3.8–10.8)

## 2023-05-02 LAB — LIPASE: Lipase: 44 U/L (ref 7–60)

## 2023-05-02 NOTE — Assessment & Plan Note (Signed)
  Infrequent bowel movements, sometimes going a day or a day and a half without a bowel movement. Patient has been using Miralax with limited success. -Continue Miralax daily to promote regular bowel movements.

## 2023-05-02 NOTE — Assessment & Plan Note (Signed)
  Persistent pain across the abdomen, particularly after eating. No evidence of hernia on physical examination. Patient reports a history of a "lazy colon". -Order liver function tests. -Order an ultrasound to evaluate the gallbladder. -Recommend a low FODMAP diet to reduce potential dietary triggers. -Refer to a gastroenterologist for further evaluation if symptoms persist.

## 2023-05-02 NOTE — Patient Instructions (Signed)
VISIT SUMMARY:  Today, we discussed your ongoing abdominal pain and constipation. We also reviewed your recent fall, which has since resolved.  YOUR PLAN:  -ABDOMINAL PAIN: You have been experiencing persistent pain in your abdomen, especially after eating. This could be related to your digestive system. We will conduct liver function tests and an ultrasound to check your gallbladder. Additionally, I recommend trying a low FODMAP diet to see if it helps reduce your symptoms. If the pain continues, we will refer you to a gastroenterologist for further evaluation.  -CONSTIPATION: You have been having infrequent and difficult bowel movements. Continue taking Miralax daily to help promote regular bowel movements.  -FALL HISTORY: You mentioned a fall three weeks ago that caused tailbone pain, which has now resolved. No further action is needed for this issue.  INSTRUCTIONS:  Please follow up in 2 months or sooner if your symptoms worsen. We will review the results of your liver function tests and ultrasound at your next visit.

## 2023-05-04 ENCOUNTER — Encounter: Payer: Self-pay | Admitting: Family

## 2023-05-06 ENCOUNTER — Encounter (HOSPITAL_COMMUNITY): Payer: Self-pay

## 2023-05-06 ENCOUNTER — Ambulatory Visit (HOSPITAL_COMMUNITY): Payer: Medicare Other | Admitting: Physician Assistant

## 2023-05-06 NOTE — Progress Notes (Incomplete)
Primary Care Physician: Sandford Craze, NP Primary Cardiologist: Dr Anne Fu Primary Electrophysiologist: Dr Graciela Husbands Referring Physician: Dr Precious Haws is a 78 y.o. 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 Patient is on Eliquis for a CHADS2VASC score of 6. He was seen by Dr Graciela Husbands on 06/28/19 and dofetilide was recommended. Patient is s/p dofetilide loading 5/18-5/21/21. He converted with the medication and did not require DCCV. He did have some episodes of bradycardia and his diltiazem was reduced.   Patient returns for follow up for atrial fibrillation and dofetilide monitoring. ***  Today, he denies symptoms of ***palpitations, chest pain, shortness of breath, orthopnea, PND, lower extremity edema, dizziness, presyncope, syncope, snoring, daytime somnolence, bleeding, or neurologic sequela. The patient is tolerating medications without difficulties and is otherwise without complaint today.    Atrial Fibrillation Risk Factors:  he does have symptoms or diagnosis of sleep apnea.   Atrial Fibrillation Management history:  Previous antiarrhythmic drugs: amiodarone, dofetilide  Previous cardioversions: several, most recently 12/10/18 Previous ablations: none Anticoagulation history: Eliquis   Past Medical History:  Diagnosis Date   Acute encephalopathy 12/12/2015   Acute respiratory failure with hypoxia and hypercapnia (HCC) 12/12/2015   Allergic rhinitis 02/13/2010   Qualifier: Diagnosis of  By: Nena Jordan    Angina at rest [facility]) 10/22/2015   Arthritis    Ataxia 08/10/2013   Atrial fibrillation (HCC) 11/2018   B12 deficiency 08/11/2013   BACK PAIN, LUMBAR 01/29/2009   Qualifier: Diagnosis of  By: Nena Jordan    BENIGN POSITIONAL VERTIGO 11/23/2009    Qualifier: Diagnosis of  By: Nena Jordan    Chronic diastolic heart failure (HCC) 04/24/2015   Chronic pain    left sided-Kristeins   Chronically elevated hemidiaphragm    right   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/04/2015   s/p CABG 2004; CFX DES 2011   Elevated troponin    Essential hypertension 12/12/2006   Qualifier: Diagnosis of  By: Charlsie Quest RMA, Lucy     GERD 12/12/2006   Qualifier: Diagnosis of  By: Charlsie Quest RMA, Lucy     GOUT 12/12/2006   Qualifier: Diagnosis of  By: Charlsie Quest RMA, Lucy     Hemorrhoids    Hyperlipidemia LDL goal <70 12/12/2006   Qualifier: Diagnosis of  By: Charlsie Quest RMA, Lucy      Hypogonadism male 04/10/2011   Hypothyroidism, acquired 02/15/2013   Insomnia 05/21/2013   Long term current use of anticoagulant therapy 06/24/2011   Myocardial infarction (HCC)    Obstructive sleep apnea-Failed CPAP 06/17/2007   Failed CPAP Using O 2 for sleep    Paroxysmal atrial fibrillation (HCC) 12/06/2016   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  Physical Exam: There were no vitals filed for this visit.   GEN: Well  nourished, well developed in no acute distress NECK: No JVD; No carotid bruits CARDIAC: {EPRHYTHM:[id number]}, no murmurs, rubs, gallops RESPIRATORY:  Clear to auscultation without rales, wheezing or rhonchi  ABDOMEN: Soft, non-tender, non-distended EXTREMITIES:  No edema; No deformity    Wt Readings from Last 3 Encounters:  05/01/23 113.4 kg  04/23/23 113.1 kg  03/12/23 112.5 kg    EKG today  demonstrates  ***   Echo 02/18/18 demonstrated  - Left ventricle: The cavity size was normal. There was mild focal    basal hypertrophy of the septum. Systolic function was normal.    The estimated ejection fraction was in the range of 55% to 60%.    Wall motion was normal; there were no regional wall motion    abnormalities. Doppler parameters are consistent with abnormal    left ventricular relaxation (grade 1 diastolic dysfunction).    Doppler parameters are consistent with elevated ventricular    end-diastolic filling pressure.  - Mitral valve: Calcified annulus. Mildly thickened leaflets .    There was mild regurgitation.  - Left atrium: The atrium was mildly dilated.  - Right ventricle: The cavity size was mildly dilated. Wall    thickness was normal. Systolic function was mildly reduced.  - Right atrium: The atrium was normal in size.  - Tricuspid valve: There was mild regurgitation.  - Pulmonary arteries: Systolic pressure was within the normal    range.  - Inferior vena cava: The vessel was normal in size.  - Pericardium, extracardiac: There was no pericardial effusion.   Epic records are reviewed at length today   CHA2DS2-VASc Score = 8  The patient's score is based upon: CHF History: 1 HTN History: 1 Diabetes History: 1 Stroke History: 2 Vascular Disease History: 1 Age Score: 2 Gender Score: 0   {Confirm score is correct.  If not, click here to update score.  REFRESH note.  :1}    ASSESSMENT AND PLAN: Longstanding Persistent Atrial Fibrillation (ICD10:  I48.11) The patient's CHA2DS2-VASc score is 8, indicating a 10.8% annual risk of stroke.   S/p dofetilide loading 07/2019 *** Continue dofetilide 500 mcg BID Continue Eliquis 5 mg BID Continue diltiazem 180 mg daily Continue Toprol 25 mg daily  Secondary Hypercoagulable State (ICD10:  D68.69){Click to add to Prob List or Visit Dx  :[id number]} The patient is at significant risk for stroke/thromboembolism  based upon his CHA2DS2-VASc Score of 8.  Continue Apixaban (Eliquis). No bleeding issues.  High Risk Medication Monitoring (ICD 10: Z79.899) QT interval acceptable for dofetilide monitoring. Check bmet/mag today ***   OSA  Patient diagnosed remotely and did not tolerate CPAP. ***  CAD S/p CABG No anginal symptoms Followed by Dr Anne Fu ***  HTN Stable on current regimen ***  Chronic HFpEF EF 55-60% GDMT per primary cardiology team Fluid status appears stable today ***   Follow up ***in the AF clinic in 6 months. Overdue for follow up with Dr Anne Fu.   Jorja Loa PA-C Afib Clinic [facility] [address] [place], [place] [address] [phone] 05/06/2023 9:24 AM

## 2023-05-14 ENCOUNTER — Ambulatory Visit (HOSPITAL_BASED_OUTPATIENT_CLINIC_OR_DEPARTMENT_OTHER)
Admission: RE | Admit: 2023-05-14 | Discharge: 2023-05-14 | Disposition: A | Discharge status: Home / Self Care | Payer: Medicare Other | Source: Ambulatory Visit | Attending: Family | Admitting: Family

## 2023-05-14 DIAGNOSIS — R1033 Periumbilical pain: Secondary | ICD-10-CM

## 2023-05-14 DIAGNOSIS — R109 Unspecified abdominal pain: Secondary | ICD-10-CM | POA: Diagnosis not present

## 2023-05-23 ENCOUNTER — Telehealth: Payer: Self-pay | Admitting: Family

## 2023-05-23 NOTE — Telephone Encounter (Signed)
 Please advise pt that they were not able to see his gallbladder on ultrasound.  I do not see that he has had it removed, can you double check with him?   I would like him to see Gastroenterology for further evaluation of his abdominal pain. Referral was placed in February- please give him number to call to schedule.

## 2023-05-25 NOTE — Telephone Encounter (Signed)
 Called home, cell and emergency contact but no answer on either one. MyChart message sent

## 2023-05-29 ENCOUNTER — Encounter: Payer: Self-pay | Admitting: Cardiology

## 2023-05-29 ENCOUNTER — Encounter: Payer: Medicare Other | Attending: Physical Medicine & Rehabilitation | Admitting: Physical Medicine & Rehabilitation

## 2023-05-29 ENCOUNTER — Encounter: Payer: Self-pay | Admitting: Physical Medicine & Rehabilitation

## 2023-05-29 ENCOUNTER — Telehealth: Payer: Self-pay | Admitting: Cardiology

## 2023-05-29 ENCOUNTER — Ambulatory Visit (HOSPITAL_COMMUNITY)
Admission: RE | Admit: 2023-05-29 | Discharge: 2023-05-29 | Disposition: A | Discharge status: Home / Self Care | Payer: Medicare Other | Source: Ambulatory Visit | Attending: Physician Assistant | Admitting: Physician Assistant

## 2023-05-29 ENCOUNTER — Encounter (HOSPITAL_COMMUNITY): Payer: Self-pay | Admitting: Physician Assistant

## 2023-05-29 VITALS — BP 143/72 | HR 75 | Ht 75.0 in | Wt 250.0 lb

## 2023-05-29 VITALS — BP 140/80 | HR 79 | Ht 75.0 in | Wt 250.0 lb

## 2023-05-29 DIAGNOSIS — G4733 Obstructive sleep apnea (adult) (pediatric): Secondary | ICD-10-CM | POA: Insufficient documentation

## 2023-05-29 DIAGNOSIS — I2510 Atherosclerotic heart disease of native coronary artery without angina pectoris: Secondary | ICD-10-CM | POA: Diagnosis not present

## 2023-05-29 DIAGNOSIS — I69352 Hemiplegia and hemiparesis following cerebral infarction affecting left dominant side: Secondary | ICD-10-CM | POA: Diagnosis not present

## 2023-05-29 DIAGNOSIS — I110 Hypertensive heart disease with heart failure: Secondary | ICD-10-CM | POA: Diagnosis not present

## 2023-05-29 DIAGNOSIS — G8110 Spastic hemiplegia affecting unspecified side: Secondary | ICD-10-CM | POA: Insufficient documentation

## 2023-05-29 DIAGNOSIS — I4811 Longstanding persistent atrial fibrillation: Secondary | ICD-10-CM | POA: Diagnosis not present

## 2023-05-29 DIAGNOSIS — Z8673 Personal history of transient ischemic attack (TIA), and cerebral infarction without residual deficits: Secondary | ICD-10-CM | POA: Diagnosis not present

## 2023-05-29 DIAGNOSIS — I4819 Other persistent atrial fibrillation: Secondary | ICD-10-CM | POA: Diagnosis present

## 2023-05-29 DIAGNOSIS — I5032 Chronic diastolic (congestive) heart failure: Secondary | ICD-10-CM | POA: Insufficient documentation

## 2023-05-29 DIAGNOSIS — Z7901 Long term (current) use of anticoagulants: Secondary | ICD-10-CM | POA: Diagnosis not present

## 2023-05-29 DIAGNOSIS — Z951 Presence of aortocoronary bypass graft: Secondary | ICD-10-CM | POA: Diagnosis not present

## 2023-05-29 DIAGNOSIS — Z5181 Encounter for therapeutic drug level monitoring: Secondary | ICD-10-CM | POA: Insufficient documentation

## 2023-05-29 DIAGNOSIS — Z79899 Other long term (current) drug therapy: Secondary | ICD-10-CM | POA: Diagnosis not present

## 2023-05-29 DIAGNOSIS — D6869 Other thrombophilia: Secondary | ICD-10-CM | POA: Diagnosis not present

## 2023-05-29 LAB — BASIC METABOLIC PANEL
Anion gap: 9 (ref 5–15)
BUN: 13 mg/dL (ref 8–23)
CO2: 27 mmol/L (ref 22–32)
Calcium: 9.5 mg/dL (ref 8.9–10.3)
Chloride: 104 mmol/L (ref 98–111)
Creatinine, Ser: 1.08 mg/dL (ref 0.61–1.24)
GFR, Estimated: >60 mL/min (ref >60)
Glucose, Bld: 117 mg/dL — ABNORMAL HIGH (ref 70–99)
Potassium: 4.6 mmol/L (ref 3.5–5.1)
Sodium: 140 mmol/L (ref 135–145)

## 2023-05-29 LAB — MAGNESIUM: Magnesium: 2.1 mg/dL (ref 1.7–2.4)

## 2023-05-29 MED ORDER — ONABOTULINUMTOXINA 100 UNITS IJ SOLR
100.0000 [IU] | Freq: Once | INTRAMUSCULAR | Status: AC
Start: 2023-05-29 — End: 2023-05-29
  Administered 2023-05-29: 100 [IU] via INTRAMUSCULAR

## 2023-05-29 MED ORDER — SODIUM CHLORIDE (PF) 0.9 % IJ SOLN
4.0000 mL | Freq: Once | INTRAMUSCULAR | Status: AC
  Administered 2023-05-29: 4 mL via INTRAVENOUS

## 2023-05-29 NOTE — Telephone Encounter (Signed)
-----   Message from Nurse Milta Deiters sent at 05/29/2023 10:54 AM EDT ----- Regarding: appt w dr Anne Fu Pt needs appt with dr Anne Fu for follow up he is past due to see him thanks stacy rn afib clinic

## 2023-05-29 NOTE — Progress Notes (Signed)
Botox Injection for spasticity using needle EMG guidance  Dilution: 50 Units/ml Indication: Severe spasticity which interferes with ADL,mobility and/or  hygiene and is unresponsive to medication management and other conservative care Informed consent was obtained after describing risks and benefits of the procedure with the patient. This includes bleeding, bruising, infection, excessive weakness, or medication side effects. A REMS form is on file and signed. Needle: 27g 1" needle electrode Number of units per muscle LEFT UE Biceps50  LEFT LE  Medial Hamstrings250 All injections were done after obtaining appropriate EMG activity and after negative drawback for blood. The patient tolerated the procedure well. Post procedure instructions were given. A followup appointment was made.     

## 2023-05-29 NOTE — Progress Notes (Signed)
 Primary Care Physician: Sandford Craze, NP Primary Cardiologist: Dr Anne Fu Primary Electrophysiologist: Dr Graciela Husbands Referring Physician: Dr Precious Haws is a 78 y.o. 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 Patient is on Eliquis for a CHADS2VASC score of 6. He was seen by Dr Graciela Husbands on 06/28/19 and dofetilide was recommended. Patient is s/p dofetilide loading 5/18-5/21/21. He converted with the medication and did not require DCCV. He did have some episodes of bradycardia and his diltiazem was reduced.   Patient returns for follow up for atrial fibrillation and dofetilide monitoring. He reports that he has done well from a cardiac standpoint. He did have a mechanical fall ~ 3 weeks ago from a seated position. He landed on his buttocks, no head trauma. No bleeding issues on anticoagulation. He remains in SR.   Today, he denies symptoms of palpitations, chest pain, shortness of breath, orthopnea, PND, lower extremity edema, dizziness, presyncope, syncope, bleeding, or neurologic sequela. The patient is tolerating medications without difficulties and is otherwise without complaint today.    Atrial Fibrillation Risk Factors:  he does have symptoms or diagnosis of sleep apnea.   Atrial Fibrillation Management history:  Previous antiarrhythmic drugs: amiodarone, dofetilide  Previous cardioversions: several, most recently 12/10/18 Previous ablations: none Anticoagulation history: Eliquis   Past Medical History:  Diagnosis Date   Acute encephalopathy 12/12/2015   Acute respiratory failure with hypoxia and hypercapnia (HCC) 12/12/2015   Allergic rhinitis 02/13/2010   Qualifier: Diagnosis of  By: Nena Jordan    Angina at rest [facility] 10/22/2015   Arthritis    Ataxia  08/10/2013   Atrial fibrillation (HCC) 11/2018   B12 deficiency 08/11/2013   BACK PAIN, LUMBAR 01/29/2009   Qualifier: Diagnosis of  By: Nena Jordan    BENIGN POSITIONAL VERTIGO 11/23/2009   Qualifier: Diagnosis of  By: Nena Jordan    Chronic diastolic heart failure (HCC) 04/24/2015   Chronic pain    left sided-Kristeins   Chronically elevated hemidiaphragm    right   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/04/2015   s/p CABG 2004; CFX DES 2011   Elevated troponin    Essential hypertension 12/12/2006   Qualifier: Diagnosis of  By: Charlsie Quest RMA, Lucy     GERD 12/12/2006   Qualifier: Diagnosis of  By: Tyrone Apple, Lucy     GOUT 12/12/2006   Qualifier: Diagnosis of  By: Charlsie Quest RMA, Lucy     Hemorrhoids    Hyperlipidemia LDL goal <70 12/12/2006   Qualifier: Diagnosis of  By: Charlsie Quest RMA, Lucy      Hypogonadism male 04/10/2011   Hypothyroidism, acquired 02/15/2013   Insomnia 05/21/2013   Long term current use of anticoagulant therapy 06/24/2011   Myocardial infarction (HCC)    Obstructive sleep apnea-Failed CPAP 06/17/2007   Failed CPAP Using O 2 for sleep    Paroxysmal atrial fibrillation (HCC) 12/06/2016   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  Physical Exam: Vitals:   05/29/23 1031  BP: (!) 140/80  Pulse: 79  Weight: 113.4 kg  Height: 6\' 3"  (1.905 m)    GEN: Well nourished, well developed in no acute distress CARDIAC: Regular rate and rhythm, no murmurs, rubs, gallops RESPIRATORY:  Clear to auscultation  without rales, wheezing or rhonchi  ABDOMEN: Soft, non-tender, non-distended EXTREMITIES:  No edema; No deformity    Wt Readings from Last 3 Encounters:  05/29/23 113.4 kg  05/01/23 113.4 kg  04/23/23 113.1 kg    EKG today demonstrates  SR, LAFB Vent. rate 79 BPM PR interval 176 ms QRS duration 120 ms QT/QTcB 328/376 ms   Echo 02/18/18 demonstrated  - Left ventricle: The cavity size was normal. There was mild focal    basal hypertrophy of the septum. Systolic function was normal.    The estimated ejection fraction was in the range of 55% to 60%.    Wall motion was normal; there were no regional wall motion    abnormalities. Doppler parameters are consistent with abnormal    left ventricular relaxation (grade 1 diastolic dysfunction).    Doppler parameters are consistent with elevated ventricular    end-diastolic filling pressure.  - Mitral valve: Calcified annulus. Mildly thickened leaflets .    There was mild regurgitation.  - Left atrium: The atrium was mildly dilated.  - Right ventricle: The cavity size was mildly dilated. Wall    thickness was normal. Systolic function was mildly reduced.  - Right atrium: The atrium was normal in size.  - Tricuspid valve: There was mild regurgitation.  - Pulmonary arteries: Systolic pressure was within the normal    range.  - Inferior vena cava: The vessel was normal in size.  - Pericardium, extracardiac: There was no pericardial effusion.   Epic records are reviewed at length today   CHA2DS2-VASc Score = 8  The patient's score is based upon: CHF History: 1 HTN History: 1 Diabetes History: 1 Stroke History: 2 Vascular Disease History: 1 Age Score: 2 Gender Score: 0       ASSESSMENT AND PLAN: Longstanding Persistent Atrial Fibrillation (ICD10:  I48.11) The patient's CHA2DS2-VASc score is 8, indicating a 10.8% annual risk of stroke.   S/p dofetilide loading 07/2019 Patient appears to be maintaining SR.  Continue dofetilide  500 mcg BID Continue Eliquis 5 mg BID Continue diltiazem 180 mg daily Continue Toprol 25 mg daily  Secondary Hypercoagulable State (ICD10:  D68.69) The patient is at significant risk for stroke/thromboembolism based upon his CHA2DS2-VASc Score of 8.  Continue Apixaban (Eliquis). No bleeding issues.  High Risk Medication Monitoring (ICD 10: Z79.899) QT interval acceptable for dofetilide monitoring. Check bmet/mag today   OSA  Patient diagnosed remotely and did not tolerate CPAP.  CAD S/p CABG No anginal symptoms Followed by Dr Anne Fu  HTN Stable on current regimen  Chronic HFpEF EF 55-60% GDMT per primary cardiology team Fluid status appears stable today   Follow up in the AF clinic in 6 months. Overdue for follow up with Dr Anne Fu, will request f/u appointment.    Jorja Loa PA-C Afib Clinic [facility] [address] [place], [place] [address] [phone] 05/29/2023 10:41 AM

## 2023-05-29 NOTE — Telephone Encounter (Signed)
 05/29/23 called to schedule follow up with Dr. Anne Fu but pt has a call blocker on his phone and it states that it will not take our call - LCN

## 2023-05-29 NOTE — Patient Instructions (Signed)

## 2023-05-31 NOTE — Telephone Encounter (Signed)
 It looks like his mychart has not been read either.  Can we please send him a printed letter in the mail? Tks

## 2023-06-02 NOTE — Telephone Encounter (Signed)
 Letter created and mail to address on file

## 2023-06-13 ENCOUNTER — Other Ambulatory Visit: Payer: Self-pay | Admitting: Family

## 2023-06-14 ENCOUNTER — Other Ambulatory Visit: Payer: Self-pay | Admitting: Family

## 2023-06-14 DIAGNOSIS — Z8639 Personal history of other endocrine, nutritional and metabolic disease: Secondary | ICD-10-CM

## 2023-06-15 ENCOUNTER — Other Ambulatory Visit: Payer: Self-pay | Admitting: Family

## 2023-06-16 ENCOUNTER — Ambulatory Visit: Payer: Self-pay | Admitting: *Deleted

## 2023-06-16 NOTE — Telephone Encounter (Signed)
 Second attempt to return his call.   No answering machine picks up and no answer.   Unable to leave a voicemail.   Will route to call back queue for further attempts.

## 2023-06-16 NOTE — Telephone Encounter (Signed)
 First attempt to return his call.   No answer or pick up by an answering machine so unable to leave a message.   Will route to call back queue for further attempts.

## 2023-06-16 NOTE — Telephone Encounter (Signed)
 Third attempt to return his call.   No answer or answering machine.   Per policy forwarding to practice after 3 unsuccessful attempts to contact him.

## 2023-06-16 NOTE — Telephone Encounter (Signed)
 Copied from CRM [id number]. Topic: Clinical - Medication Question >> Jun 16, 2023 11:49 AM [place] J wrote: Reason for CRM: Patient was wondering if he could get tremfya to hold him up until he sees the gastrologist. Patient is having abdominal discomfort and still has to schedule wit gastro.

## 2023-06-17 ENCOUNTER — Encounter: Payer: Self-pay | Admitting: Pediatrics

## 2023-06-17 ENCOUNTER — Other Ambulatory Visit: Payer: Self-pay | Admitting: Family

## 2023-06-17 ENCOUNTER — Other Ambulatory Visit: Payer: Self-pay | Admitting: Cardiology

## 2023-06-17 ENCOUNTER — Other Ambulatory Visit

## 2023-06-17 ENCOUNTER — Ambulatory Visit: Admitting: Pediatrics

## 2023-06-17 VITALS — BP 118/82 | HR 80 | Ht 75.0 in | Wt 243.0 lb

## 2023-06-17 DIAGNOSIS — I4811 Longstanding persistent atrial fibrillation: Secondary | ICD-10-CM

## 2023-06-17 DIAGNOSIS — Z860101 Personal history of adenomatous and serrated colon polyps: Secondary | ICD-10-CM | POA: Diagnosis not present

## 2023-06-17 DIAGNOSIS — I10 Essential (primary) hypertension: Secondary | ICD-10-CM

## 2023-06-17 DIAGNOSIS — K760 Fatty (change of) liver, not elsewhere classified: Secondary | ICD-10-CM

## 2023-06-17 DIAGNOSIS — R634 Abnormal weight loss: Secondary | ICD-10-CM

## 2023-06-17 DIAGNOSIS — K5900 Constipation, unspecified: Secondary | ICD-10-CM | POA: Diagnosis not present

## 2023-06-17 DIAGNOSIS — R109 Unspecified abdominal pain: Secondary | ICD-10-CM

## 2023-06-17 DIAGNOSIS — R1033 Periumbilical pain: Secondary | ICD-10-CM | POA: Diagnosis not present

## 2023-06-17 DIAGNOSIS — K219 Gastro-esophageal reflux disease without esophagitis: Secondary | ICD-10-CM | POA: Diagnosis not present

## 2023-06-17 DIAGNOSIS — Z8639 Personal history of other endocrine, nutritional and metabolic disease: Secondary | ICD-10-CM

## 2023-06-17 DIAGNOSIS — Z8719 Personal history of other diseases of the digestive system: Secondary | ICD-10-CM | POA: Diagnosis not present

## 2023-06-17 MED ORDER — BENAZEPRIL HCL 10 MG PO TABS: 10.0000 mg | ORAL_TABLET | Freq: Every day | ORAL | 1 refills | Status: DC

## 2023-06-17 MED ORDER — DICYCLOMINE HCL 10 MG PO CAPS: 10.0000 mg | ORAL_CAPSULE | Freq: Three times a day (TID) | ORAL | 1 refills | Status: DC

## 2023-06-17 NOTE — Telephone Encounter (Signed)
 It looks like pt has appointment with GI at 4 PM today.

## 2023-06-17 NOTE — Patient Instructions (Addendum)
 We have sent the following medications to your pharmacy for you to pick up at your convenience: Dicyclomine 10mg  3 times day before meals.  Your provider has requested that you go to the basement level for lab work before leaving today. Press "B" on the elevator. The lab is located at the first door on the left as you exit the elevator.  Due to recent changes in healthcare laws, you may see the results of your imaging and laboratory studies on MyChart before your provider has had a chance to review them.  We understand that in some cases there may be results that are confusing or concerning to you. Not all laboratory results come back in the same time frame and the provider may be waiting for multiple results in order to interpret others.  Please give [place] 48 hours in order for your provider to thoroughly review all the results before contacting the office for clarification of your results.    Follow up in 3 months.  You have been scheduled for a HIDA scan at [facility] Radiology (1st floor) on Friday, 06/26/23. Please arrive 30 minutes prior to your scheduled appointment at  9:60AV. Make certain not to have anything to eat or drink at least 6 hours prior to your test. Should this appointment date or time not work well for you, please call radiology scheduling at [phone].  _____________________________________________________________________ hepatobiliary (HIDA) scan is an imaging procedure used to diagnose problems in the liver, gallbladder and bile ducts. In the HIDA scan, a radioactive chemical or tracer is injected into a vein in your arm. The tracer is handled by the liver like bile. Bile is a fluid produced and excreted by your liver that helps your digestive system break down fats in the foods you eat. Bile is stored in your gallbladder and the gallbladder releases the bile when you eat a meal. A special nuclear medicine scanner (gamma camera) tracks the flow of the tracer from your liver into  your gallbladder and small intestine.  During your HIDA scan  You'll be asked to change into a hospital gown before your HIDA scan begins. Your health care team will position you on a table, usually on your back. The radioactive tracer is then injected into a vein in your arm.The tracer travels through your bloodstream to your liver, where it's taken up by the bile-producing cells. The radioactive tracer travels with the bile from your liver into your gallbladder and through your bile ducts to your small intestine.You may feel some pressure while the radioactive tracer is injected into your vein. As you lie on the table, a special gamma camera is positioned over your abdomen taking pictures of the tracer as it moves through your body. The gamma camera takes pictures continually for about an hour. You'll need to keep still during the HIDA scan. This can become uncomfortable, but you may find that you can lessen the discomfort by taking deep breaths and thinking about other things. Tell your health care team if you're uncomfortable. The radiologist will watch on a computer the progress of the radioactive tracer through your body. The HIDA scan may be stopped when the radioactive tracer is seen in the gallbladder and enters your small intestine. This typically takes about an hour. In some cases extra imaging will be performed if original images aren't satisfactory, if morphine is given to help visualize the gallbladder or if the medication CCK is given to look at the contraction of the gallbladder. This test typically  takes 2 hours to complete.  Thank you for entrusting me with your care and for choosing [facility], Dr. Maren Beach   _______________________________________________________  If your blood pressure at your visit was 140/90 or greater, please contact your primary care physician to follow up on this.  _______________________________________________________  If you are age 15 or  older, your body mass index should be between 23-30. Your Body mass index is 30.37 kg/m. If this is out of the aforementioned range listed, please consider follow up with your Primary Care Provider.  If you are age 80 or younger, your body mass index should be between 19-25. Your Body mass index is 30.37 kg/m. If this is out of the aformentioned range listed, please consider follow up with your Primary Care Provider.   ________________________________________________________  The [facility] providers would like to encourage you to use [facility] to communicate with providers for non-urgent requests or questions.  Due to long hold times on the telephone, sending your provider a message by [facility] may be a faster and more efficient way to get a response.  Please allow 48 business hours for a response.  Please remember that this is for non-urgent requests.  _______________________________________________________  _______________________________________________________________________

## 2023-06-17 NOTE — Progress Notes (Signed)
 [facility] Initial Consultation   Referring Provider Sandford Craze, NP [address] [place],  [place] [address]  Primary Care Provider Sandford Craze, NP  Patient Profile: [name] is a 78 y.o. male who is seen in consultation in the [facility] at the request of Dr. Peggyann Juba for evaluation and management of the problem(s) noted below.  Problem List: Periumbilical abdominal pain GERD Constipation History of adenomatous colon polyp History of hemorrhoids status post hemorrhoidectomy Hepatic steatosis on abdominal ultrasound 2025   History of Present Illness   [name] is a 78 y.o. male with a history of CAD status post CAB G 2004 and DES 2011, A-fib on Eliquis, CHF, OSA, T2DM, hypothyroidism, hypogonadism, CVA with spastic hemiplegia and gout who is referred to the gastroenterology office for evaluation and management of periumbilical abdominal pain.  In speaking with [name] as well as reviewing his prior medical records, he reports the following history:  -- Onset of daily, postprandial periumbilical abdominal pain and fall 2024 -- Pain occurs immediately after eating or drinking any type of food or liquid -- Denies specific food sensitivities -- No associated nausea or vomiting -- States that bowel movements can vacillate between small hard balls and normal bowel movements -- May have some degree of constipation and can go up to 4 days without a bowel movement although he states this is now rare 6 incorporating fiber tablets and Metamucil into his diet -- No blood or mucus in stool -- Endorses a 7 to 8 pound weight loss over 2 weeks related to his abdominal pain  Workup performed by PCP includes: -- CBC, LFTs, lipase, BMP-normal 04/2023 -- CTAP 12/2022-small fat-containing right inguinal hernia -- AUS 04/2023-gallbladder is not visualized, increased hepatic parenchymal echogenicity suggestive of steatosis  --  Has a history of GERD which is currently stable on pantoprazole 40 mg orally daily  -- In the office today is inquiring if I can prescribe Tremfya to treat his abdominal pain -Long discussion that Tremfya is indicated for the treatment of ulcerative colitis which he does not have and would not be helpful for his current condition  Last colonoscopy: 02/2020 -3 mm colon polyp in [facility], small nonbleeding rectal varices, nonbleeding internal hemorrhoids Last endoscopy: 2007  Last Abd CT/CTE/MRE: 12/2022 - small fat-containing right inguinal hernia  GI Review of Symptoms Significant for abdominal pain, intermittent constipation. Otherwise negative.  General Review of Systems  Review of systems is significant for the pertinent positives and negatives as listed per the HPI.  Full ROS is otherwise negative.  Past Medical History   Past Medical History:  Diagnosis Date   Acute encephalopathy 12/12/2015   Acute respiratory failure with hypoxia and hypercapnia (HCC) 12/12/2015   Allergic rhinitis 02/13/2010   Qualifier: Diagnosis of  By: Nena Jordan    Angina at rest [facility]) 10/22/2015   Arthritis    Ataxia 08/10/2013   Atrial fibrillation (HCC) 11/2018   B12 deficiency 08/11/2013   BACK PAIN, LUMBAR 01/29/2009   Qualifier: Diagnosis of  By: Nena Jordan    BENIGN POSITIONAL VERTIGO 11/23/2009   Qualifier: Diagnosis of  By: Nena Jordan    Chronic diastolic heart failure (HCC) 04/24/2015   Chronic pain    left sided-Kristeins   Chronically elevated hemidiaphragm    right   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/04/2015   s/p CABG 2004; CFX DES 2011   Elevated troponin  Essential hypertension 12/12/2006   Qualifier: Diagnosis of  By: Charlsie Quest RMA, Lucy     GERD 12/12/2006   Qualifier: Diagnosis of  By: Charlsie Quest RMA, Lucy     GOUT 12/12/2006   Qualifier: Diagnosis of  By: Charlsie Quest RMA, Lucy     Hemorrhoids    Hyperlipidemia LDL goal <70  12/12/2006   Qualifier: Diagnosis of  By: Charlsie Quest RMA, Lucy      Hypogonadism male 04/10/2011   Hypothyroidism, acquired 02/15/2013   Insomnia 05/21/2013   Long term current use of anticoagulant therapy 06/24/2011   Myocardial infarction (HCC)    Obstructive sleep apnea-Failed CPAP 06/17/2007   Failed CPAP Using O 2 for sleep    Paroxysmal atrial fibrillation (HCC) 12/06/2016   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; resultant hemiplegia of dominant side   Thoracic aorta atherosclerosis (HCC) 04/27/2017   Tubular adenoma of colon 05/2010   Type 2 diabetes mellitus without complication, without long-term current use of insulin (HCC) 10/05/2007   Qualifier: Diagnosis of  By: Nena Jordan    Vasovagal syncope    Vasovagal syncope    Wears glasses      Past Surgical History   Past Surgical History:  Procedure Laterality Date   CARDIAC CATHETERIZATION N/A 10/25/2015   Procedure: Left Heart Cath and Cors/Grafts Angiography;  Surgeon: Lennette Bihari, MD;  Location: [facility] INVASIVE CV LAB;  Service: Cardiovascular;  Laterality: N/A;   CARDIOVERSION  06/20/2011   Procedure: CARDIOVERSION;  Surgeon: Laurey Morale, MD;  Location: [facility] ENDOSCOPY;  Service: Cardiovascular;  Laterality: N/A;   CARDIOVERSION N/A 04/26/2015   Procedure: CARDIOVERSION;  Surgeon: Lars Masson, MD;  Location: [facility] ENDOSCOPY;  Service: Cardiovascular;  Laterality: N/A;   CARDIOVERSION N/A 05/10/2015   Procedure: CARDIOVERSION;  Surgeon: Laurey Morale, MD;  Location: [facility] ENDOSCOPY;  Service: Cardiovascular;  Laterality: N/A;   CARDIOVERSION N/A 12/10/2018   Procedure: CARDIOVERSION;  Surgeon: Thurmon Fair, MD;  Location: [facility] ENDOSCOPY;  Service: Cardiovascular;  Laterality: N/A;   CARPOMETACARPAL (CMC) FUSION OF THUMB Right 07/18/2022   Procedure: right carpo metacarpal suspensionplasty with abductor tendon transfer right wrist stenosing tenosynovitis release, right index carpo metacarpal boss excision;  Surgeon:  Dairl Ponder, MD;  Location: [facility] OR;  Service: Orthopedics;  Laterality: Right;   CATARACT EXTRACTION  05/2010   left eye   CATARACT EXTRACTION  04/2010   right eye   COLONOSCOPY WITH PROPOFOL N/A 02/22/2020   Procedure: COLONOSCOPY WITH PROPOFOL;  Surgeon: Shellia Cleverly, DO;  Location: [facility] ENDOSCOPY;  Service: Gastroenterology;  Laterality: N/A;   CORONARY ARTERY BYPASS GRAFT     stent   DORSAL COMPARTMENT RELEASE Right 07/18/2022   Procedure: RELEASE DORSAL COMPARTMENT (DEQUERVAIN);  Surgeon: Dairl Ponder, MD;  Location[facility] OR;  Service: Orthopedics;  Laterality: Right;  Anethesia Axillary block/ MAC   ESOPHAGOGASTRODUODENOSCOPY  03-25-2005   HEMORRHOID SURGERY N/A 09/05/2021   Procedure: HEMORRHOIDECTOMY WITH LIGATION AND HEMORRHOIDOPEXY;  Surgeon: Karie Soda, MD;  Location: [facility] ORS;  Service: General;  Laterality: N/A;   LEFT HEART CATH AND CORS/GRAFTS ANGIOGRAPHY N/A 02/19/2018   Procedure: LEFT HEART CATH AND CORS/GRAFTS ANGIOGRAPHY;  Surgeon: Swaziland, Peter M, MD;  Location: [facility] INVASIVE CV LAB;  Service: Cardiovascular;  Laterality: N/A;   NASAL SEPTUM SURGERY     PERCUTANEOUS PLACEMENT INTRAVASCULAR STENT CERVICAL CAROTID ARTERY     03-2009; using a drug-eluting platform of the circumflex cornoray artery with a 3.0 x 18 [employer] Promus drug-eluting platform post dilated  to 3.75 with a noncompliant balloon.   POLYPECTOMY  02/22/2020   Procedure: POLYPECTOMY;  Surgeon: Shellia Cleverly, DO;  Location: [facility] ENDOSCOPY;  Service: Gastroenterology;;   RECTAL EXAM UNDER ANESTHESIA N/A 09/05/2021   Procedure: ANORECTAL EXAM UNDER ANESTHESIA;  Surgeon: Karie Soda, MD;  Location: [facility] ORS;  Service: General;  Laterality: N/A;   TEE WITHOUT CARDIOVERSION  06/20/2011   Procedure: TRANSESOPHAGEAL ECHOCARDIOGRAM (TEE);  Surgeon: Laurey Morale, MD;  Location: [facility] ENDOSCOPY;  Service: Cardiovascular;  Laterality: N/A;     Allergies and Medications  No Known Allergies    Current  Meds  Medication Sig   allopurinol (ZYLOPRIM) 100 MG tablet TAKE 2 TABLETS BY MOUTH EVERY DAY   atorvastatin (LIPITOR) 40 MG tablet TAKE 1 TABLET BY MOUTH EVERY DAY   benazepril (LOTENSIN) 10 MG tablet TAKE 1 TABLET BY MOUTH EVERY DAY   benazepril (LOTENSIN) 10 MG tablet Take 1 tablet (10 mg total) by mouth daily.   benazepril (LOTENSIN) 5 MG tablet TAKE 1 TABLET (5 MG TOTAL) BY MOUTH DAILY. TAKE WITH BENAZEPRIL 10 MG   dicyclomine (BENTYL) 10 MG capsule Take 1 capsule (10 mg total) by mouth 3 (three) times daily before meals.   dofetilide (TIKOSYN) 500 MCG capsule Take 1 capsule (500 mcg total) by mouth 2 (two) times daily. Please call [phone] to schedule a March appointment with Dr. Graciela Husbands for future refills. Thank you.   ELIQUIS 5 MG TABS tablet TAKE 1 TABLET BY MOUTH TWICE A DAY   fluocinonide gel (LIDEX) 0.05 % Apply 1 Application topically 2 (two) times daily as needed.   folic acid (FOLVITE) 1 MG tablet TAKE 1 TABLET BY MOUTH EVERY DAY   gabapentin (NEURONTIN) 100 MG capsule TAKE 1 CAPSULE BY MOUTH AT BEDTIME.   HYDROcodone-acetaminophen (NORCO/VICODIN) 5-325 MG tablet Take 1 tablet by mouth every 4 (four) hours as needed for moderate pain (pain score 4-6).   levothyroxine (SYNTHROID) 112 MCG tablet TAKE 1 TABLET BY MOUTH EVERY DAY BEFORE BREAKFAST   pantoprazole (PROTONIX) 40 MG tablet TAKE 1 TABLET BY MOUTH DAILY BEFORE BREAKFAST   traMADol (ULTRAM) 50 MG tablet TAKE 1 TABLET BY MOUTH TWICE A DAY   traMADol (ULTRAM-ER) 200 MG 24 hr tablet TAKE 1 TABLET BY MOUTH EVERY DAY     Family History   Family History  Problem Relation Age of Onset   Stroke Mother        deceased-MINISTROKES   Lung cancer Father        deceased   Heart disease Father    Colon cancer Neg Hx    Esophageal cancer Neg Hx    Liver disease Neg Hx      Social History   Social History   Tobacco Use   Smoking status: Never   Smokeless tobacco: Never   Tobacco comments:    Never smoked 05/29/23   Vaping Use   Vaping status: Never Used  Substance Use Topics   Alcohol use: Not Currently   Drug use: No   Jeffery reports that he has never smoked. He has never used smokeless tobacco. He reports that he does not currently use alcohol. He reports that he does not use drugs.  Denies alcohol or tobacco use  Vital Signs and Physical Examination   Vitals:   06/17/23 1546  BP: 118/82  Pulse: 80   Body mass index is 30.37 kg/m. Weight: 243 lb (110.2 kg)  General: Elderly, ambulates with a walker Head: Normocephalic and atraumatic Eyes: Sclerae anicteric,  EOMI Lungs: Clear throughout to auscultation Heart: Regular rate and rhythm; No murmurs, rubs or bruits Abdomen: Soft, tender to palpation in mid abdomen and non distended. No masses, hepatosplenomegaly or hernias noted. Normal Bowel sounds Rectal: Deferred Psychological:  Alert  -tangential in thought with need for redirection  Review of Data  The following data was reviewed at the time of this encounter:  Laboratory Studies      Latest Ref Rng & Units 05/01/2023    3:34 PM 01/01/2023    6:43 PM 07/18/2022    6:08 AM  CBC  WBC 3.8 - 10.8 Thousand/uL 6.0  7.9  6.0   Hemoglobin 13.2 - 17.1 g/dL 16.1  09.6  04.5   Hematocrit 38.5 - 50.0 % 40.6  40.3  39.8   Platelets 140 - 400 Thousand/uL 147  148  135     Lab Results  Component Value Date   LIPASE 44 05/01/2023      Latest Ref Rng & Units 05/29/2023   11:29 AM 05/01/2023    3:34 PM 01/01/2023    6:43 PM  CMP  Glucose 70 - 99 mg/dL 409   811   BUN 8 - 23 mg/dL 13   21   Creatinine 9.14 - 1.24 mg/dL 7.82   9.56   Sodium 213 - 145 mmol/L 140   141   Potassium 3.5 - 5.1 mmol/L 4.6   4.8   Chloride 98 - 111 mmol/L 104   104   CO2 22 - 32 mmol/L 27   26   Calcium 8.9 - 10.3 mg/dL 9.5   9.7   Total Protein 6.1 - 8.1 g/dL  6.9  7.0   Total Bilirubin 0.2 - 1.2 mg/dL  0.5  1.0   Alkaline Phos 38 - 126 U/L   50   AST 10 - 35 U/L  30  40   ALT 9 - 46 U/L  37  43       Imaging Studies  AUS 04/2023 1. The gallbladder is not able to be visualized or assessed. 2. Increased hepatic parenchymal echogenicity suggestive of steatosis.  KUB 04/2023 The bowel gas pattern is normal. Colonic stool burden within normal limits. There is no evidence of free air. No radio-opaque calculi or other significant radiographic abnormality is seen. No acute osseous abnormality.   CTAP 12/2022 1. Negative for acute appendicitis. 2. Small fat containing right inguinal hernia. 3. Aortic atherosclerosis.  GI Procedures and Studies  Colonoscopy 02/2020 3 mm colon polyp in AC (TA), small nonbleeding rectal varices, nonbleeding internal hemorrhoids  Colonoscopy 05/2010 6 mm descending colon polyp (TA), internal hemorrhoids  EGD and colonoscopy 03/2005 EGD-granular and edematous mucosa in stomach, otherwise normal Colonoscopy-normal, no polyps  Clinical Impression  It is my clinical impression that [name] is a 78 y.o. male with;  Periumbilical abdominal pain GERD Constipation History of adenomatous colon polyp History of hemorrhoids status post hemorrhoidectomy Hepatic steatosis on abdominal ultrasound 2025  [name] is referred to the office today to primarily discuss his symptoms of periumbilical abdominal pain.  He reports that his abdominal discomfort began in fall 2024 and has persisted.  He reports daily, postprandial abdominal pain that is present after eating or drinking any type of food or liquid.  No specific food sensitivities.  He has had a history of constipation but states this is improving with the use of fiber supplements.  KUB performed in February showed a normal stool burden.  He has had a thorough preliminary  evaluation performed by his PCP including laboratory studies, CT scan of the abdomen and pelvis and ultrasound.  These have been largely unrevealing.  His gallbladder was not visualized on ultrasound but was normal on CT scan.  Noted to have  hepatic steatosis on ultrasound.  At today's visit we discussed that the differential diagnosis for his symptoms could include: biliary dyskinesia, celiac disease, peptic ulcer disease, H. pylori infection, nonulcer dyspepsia/functional bowel disease.  Mesenteric ischemia is also in the differential diagnosis although there were no vascular abnormalities on his recent CT scan.  In terms of his history of GERD this appears to be stable on his current regimen of pantoprazole 40 mg orally daily.  He is not endorsing any worsening symptoms today to warrant change in his therapy.  He has had a history of adenomatous colon polyps and is of an age at which surveillance has been discontinued.  Recent abdominal ultrasound disclosed evidence of hepatic steatosis.  Liver enzymes are normal.   Plan  Labs ordered today: Celiac panel and H. pylori stool antigen Recommend monitoring hepatic function panel every 6 to 12 months in the setting of hepatic steatosis Schedule HIDA scan for further evaluation of gallbladder and possible biliary dyskinesia If above workup is unremarkable consider CT angiogram to rule out mesenteric ischemia given postprandial abdominal pain and report of weight loss Provided a trial of dicyclomine 10 mg p.o. 3 times daily before meals for possible functional abdominal pain Continue fiber supplements and Metamucil for management of constipation Monitor weight and anthropometrics  Planned Follow Up 2-3 months   The patient or caregiver verbalized understanding of the material covered, with no barriers to understanding. All questions were answered. Patient or caregiver is agreeable with the plan outlined above.    It was a pleasure to see [name].  If you have any questions or concerns regarding this evaluation, do not hesitate to contact me.  Maren Beach, MD [facility]

## 2023-06-17 NOTE — Telephone Encounter (Signed)
 Prescription refill request for Eliquis received. Indication:afib Last office visit:3/25 Scr:1.08  3/25 Age: 78 Weight:113.4  kg  Prescription refilled

## 2023-06-18 ENCOUNTER — Encounter: Payer: Self-pay | Admitting: Pediatrics

## 2023-06-18 LAB — TISSUE TRANSGLUTAMINASE ABS,IGG,IGA
(tTG) Ab, IgA: <1 U/mL
(tTG) Ab, IgG: <1 U/mL

## 2023-06-18 LAB — IGA: Immunoglobulin A: 300 mg/dL (ref 70–320)

## 2023-06-22 DIAGNOSIS — H43811 Vitreous degeneration, right eye: Secondary | ICD-10-CM | POA: Diagnosis not present

## 2023-06-22 DIAGNOSIS — E113293 Type 2 diabetes mellitus with mild nonproliferative diabetic retinopathy without macular edema, bilateral: Secondary | ICD-10-CM | POA: Diagnosis not present

## 2023-06-22 DIAGNOSIS — H35341 Macular cyst, hole, or pseudohole, right eye: Secondary | ICD-10-CM | POA: Diagnosis not present

## 2023-06-22 DIAGNOSIS — H35371 Puckering of macula, right eye: Secondary | ICD-10-CM | POA: Diagnosis not present

## 2023-06-22 LAB — HM DIABETES EYE EXAM

## 2023-06-26 ENCOUNTER — Ambulatory Visit (HOSPITAL_COMMUNITY)
Admission: RE | Admit: 2023-06-26 | Discharge: 2023-06-26 | Disposition: A | Discharge status: Home / Self Care | Source: Ambulatory Visit | Attending: Pediatrics | Admitting: Pediatrics

## 2023-06-26 DIAGNOSIS — R109 Unspecified abdominal pain: Secondary | ICD-10-CM | POA: Insufficient documentation

## 2023-06-26 MED ORDER — TECHNETIUM TC 99M MEBROFENIN IV KIT
5.0000 | PACK | Freq: Once | INTRAVENOUS | Status: AC
  Administered 2023-06-26: 5 via INTRAVENOUS

## 2023-06-30 ENCOUNTER — Encounter: Payer: Self-pay | Admitting: Pediatrics

## 2023-07-07 ENCOUNTER — Other Ambulatory Visit

## 2023-07-07 DIAGNOSIS — R109 Unspecified abdominal pain: Secondary | ICD-10-CM | POA: Diagnosis not present

## 2023-07-09 LAB — H. PYLORI ANTIGEN, STOOL: H pylori Ag, Stl: NEGATIVE

## 2023-07-13 ENCOUNTER — Encounter: Payer: Self-pay | Admitting: Pediatrics

## 2023-07-17 ENCOUNTER — Other Ambulatory Visit: Payer: Self-pay | Admitting: Internal Medicine

## 2023-07-17 NOTE — Telephone Encounter (Signed)
This is a A-Fib clinic pt 

## 2023-07-21 ENCOUNTER — Telehealth: Payer: Self-pay | Admitting: Pediatrics

## 2023-07-21 ENCOUNTER — Telehealth (HOSPITAL_BASED_OUTPATIENT_CLINIC_OR_DEPARTMENT_OTHER): Payer: Self-pay

## 2023-07-21 DIAGNOSIS — R1033 Periumbilical pain: Secondary | ICD-10-CM

## 2023-07-21 DIAGNOSIS — R1084 Generalized abdominal pain: Secondary | ICD-10-CM

## 2023-07-21 NOTE — Telephone Encounter (Signed)
 CT angio order in epic. Secure staff message sent to radiology scheduling to contact patient to schedule CT angio at [facility].

## 2023-07-21 NOTE — Telephone Encounter (Signed)
 I spoke on the telephone with [name] to review his recent results as it was noted that he had not checked his MyChart messages.  Discussed that his HIDA scan did not show biliary dyskinesia and H. pylori stool test was negative.  He reports that he is continuing to experience postprandial abdominal pain.  No further weight loss.  Does endorse some symptoms of constipation and incomplete evacuation.  I advised scheduling a CT angiogram to rule out mesenteric ischemia as an etiology of his postprandial abdominal pain.  He is agreeable to proceed with this.  If CT angiogram is unremarkable we will focus more on constipation management which could be contributing to his symptoms.  All questions answered.

## 2023-07-21 NOTE — Addendum Note (Signed)
 Addended by: Barby Colvard N on: 07/21/2023 12:52 PM   Modules accepted: Orders

## 2023-07-27 NOTE — Telephone Encounter (Signed)
 Patient was contacted by radiology scheduling once on 07/21/23. I have sent a secure staff message requesting that someone reach out to patient again.

## 2023-07-28 ENCOUNTER — Telehealth (HOSPITAL_BASED_OUTPATIENT_CLINIC_OR_DEPARTMENT_OTHER): Payer: Self-pay

## 2023-07-30 NOTE — Telephone Encounter (Signed)
 Radiology attempted to reach patient by phone on 5/13, it was noted that there was no vm available. I called patient's wife, line rings then goes to fast busy signal. Attempted to reach patient on his home phone and was able to reach him. I provided him with the phone number to radiology scheduling.

## 2023-07-30 NOTE — Telephone Encounter (Signed)
 CT angio scheduled for 08/05/23 at 11 am

## 2023-08-05 ENCOUNTER — Encounter (HOSPITAL_BASED_OUTPATIENT_CLINIC_OR_DEPARTMENT_OTHER): Payer: Self-pay

## 2023-08-05 ENCOUNTER — Ambulatory Visit (HOSPITAL_BASED_OUTPATIENT_CLINIC_OR_DEPARTMENT_OTHER)
Admission: RE | Admit: 2023-08-05 | Discharge: 2023-08-05 | Disposition: A | Discharge status: Home / Self Care | Source: Ambulatory Visit | Attending: Pediatrics | Admitting: Pediatrics

## 2023-08-05 DIAGNOSIS — R109 Unspecified abdominal pain: Secondary | ICD-10-CM | POA: Diagnosis not present

## 2023-08-05 DIAGNOSIS — I701 Atherosclerosis of renal artery: Secondary | ICD-10-CM | POA: Diagnosis not present

## 2023-08-05 DIAGNOSIS — K4090 Unilateral inguinal hernia, without obstruction or gangrene, not specified as recurrent: Secondary | ICD-10-CM | POA: Diagnosis not present

## 2023-08-05 DIAGNOSIS — R1033 Periumbilical pain: Secondary | ICD-10-CM | POA: Insufficient documentation

## 2023-08-05 DIAGNOSIS — R1084 Generalized abdominal pain: Secondary | ICD-10-CM | POA: Diagnosis not present

## 2023-08-05 DIAGNOSIS — K559 Vascular disorder of intestine, unspecified: Secondary | ICD-10-CM | POA: Diagnosis not present

## 2023-08-05 MED ORDER — IOHEXOL 350 MG/ML SOLN
100.0000 mL | Freq: Once | INTRAVENOUS | Status: AC | PRN
  Administered 2023-08-05: 100 mL via INTRAVENOUS

## 2023-08-05 NOTE — Addendum Note (Signed)
 Addended by: Davian Wollenberg N on: 08/05/2023 11:29 AM   Modules accepted: Orders

## 2023-08-05 NOTE — Telephone Encounter (Signed)
 Called and spoke with patient, advised that we will put in an order for ISTAT to be drawn at time of CT angio. Pt verbalized understanding.

## 2023-08-05 NOTE — Telephone Encounter (Signed)
 Inbound call from patient, states he would like to know if he has to get labs done prior to CT, states someone called him about labs but our office did not advise on anything.

## 2023-08-11 ENCOUNTER — Ambulatory Visit: Payer: Self-pay | Admitting: Pediatrics

## 2023-08-11 ENCOUNTER — Telehealth: Payer: Self-pay | Admitting: Pediatrics

## 2023-08-11 MED ORDER — SUCRALFATE 1 GM/10ML PO SUSP: 1.0000 g | Freq: Four times a day (QID) | ORAL | 3 refills | Status: DC

## 2023-08-11 NOTE — Telephone Encounter (Signed)
 I spoke on the telephone with [name] to review the results of his CT angiogram.  This was overall normal and did not show any clinically significant stenoses that would cause mesenteric ischemia to account for his symptoms of postprandial abdominal pain.  He informs me that his abdominal discomfort remains unchanged-no better or no worse.  He continues to experience abdominal discomfort anytime he eats or drinks.  We reviewed the following potential options for next steps: Trial of Carafate 1 g p.o. 4 times daily before meals and at bedtime to see if this helps his symptoms Consideration of EGD with biopsies -reviewed risks of anesthesia at his age  Derelle prefers to follow a conservative route and would like to trial Carafate.  I advised that he trial this for 2 to 4 weeks and if symptoms are not improving to notify my office.  All questions answered.

## 2023-08-12 ENCOUNTER — Ambulatory Visit (INDEPENDENT_AMBULATORY_CARE_PROVIDER_SITE_OTHER): Admitting: Family

## 2023-08-12 VITALS — BP 134/71 | HR 81 | Temp 98.6°F | Resp 16 | Ht 75.0 in | Wt 242.0 lb

## 2023-08-12 DIAGNOSIS — L0291 Cutaneous abscess, unspecified: Secondary | ICD-10-CM

## 2023-08-12 MED ORDER — CEPHALEXIN 500 MG PO CAPS: 500.0000 mg | ORAL_CAPSULE | Freq: Three times a day (TID) | ORAL | 0 refills | Status: DC

## 2023-08-12 NOTE — Progress Notes (Unsigned)
 Subjective:     Patient ID: [name], male    DOB: November 10, 1945, 78 y.o.   MRN: [id number]  Chief Complaint  Patient presents with   Wound Check    Patient reports having a wound on his upper back. First noticed about 2 years ago.     HPI  Discussed the use of AI scribe software for clinical note transcription with the patient, who gave verbal consent to proceed.  History of Present Illness   [name] is an 78 year old male who presents with a persistent back wound that has been bleeding intermittently.  The wound is located on his back, with numbness on the left side, preventing him from feeling tenderness. His wife assists with bandaging, using a bandage and antibiotic ointment for management.  He experiences abdominal pain when eating and is under the care of a gastroenterologist. He is currently taking Carafate for this issue.  He recently fell, resulting in a skin tear, which he manages by keeping it covered to prevent reopening.    Health Maintenance Due  Topic Date Due   OPHTHALMOLOGY EXAM  10/16/2014   Medicare Annual Wellness (AWV)  05/21/2017   COVID-19 Vaccine (3 - Pfizer risk series) 06/29/2019   HEMOGLOBIN A1C  05/26/2023   Diabetic kidney evaluation - Urine ACR  05/27/2023   FOOT EXAM  05/27/2023    Past Medical History:  Diagnosis Date   Acute encephalopathy 12/12/2015   Acute respiratory failure with hypoxia and hypercapnia (HCC) 12/12/2015   Allergic rhinitis 02/13/2010   Qualifier: Diagnosis of  By: Marthe Slain    Angina at rest [facility]) 10/22/2015   Arthritis    Ataxia 08/10/2013   Atrial fibrillation (HCC) 11/2018   B12 deficiency 08/11/2013   BACK PAIN, LUMBAR 01/29/2009   Qualifier: Diagnosis of  By: Marthe Slain    BENIGN POSITIONAL VERTIGO 11/23/2009   Qualifier: Diagnosis of  By: Marthe Slain    Chronic diastolic heart failure (HCC) 04/24/2015   Chronic pain    left sided-Kristeins   Chronically elevated  hemidiaphragm    right   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/04/2015   s/p CABG 2004; CFX DES 2011   Elevated troponin    Essential hypertension 12/12/2006   Qualifier: Diagnosis of  By: Georganne Kind RMA, Lucy     GERD 12/12/2006   Qualifier: Diagnosis of  By: Ana Balling, Lucy     GOUT 12/12/2006   Qualifier: Diagnosis of  By: Georganne Kind RMA, Lucy     Hemorrhoids    Hyperlipidemia LDL goal <70 12/12/2006   Qualifier: Diagnosis of  By: Georganne Kind RMA, Lucy      Hypogonadism male 04/10/2011   Hypothyroidism, acquired 02/15/2013   Insomnia 05/21/2013   Long term current use of anticoagulant therapy 06/24/2011   Myocardial infarction (HCC)    Obstructive sleep apnea-Failed CPAP 06/17/2007   Failed CPAP Using O 2 for sleep    Paroxysmal atrial fibrillation (HCC) 12/06/2016   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; resultant hemiplegia of dominant side   Thoracic aorta atherosclerosis (HCC) 04/27/2017   Tubular adenoma of colon 05/2010   Type 2 diabetes mellitus without complication, without long-term current use of insulin  (HCC) 10/05/2007   Qualifier: Diagnosis of  By: Marthe Slain    Vasovagal syncope    Vasovagal syncope    Wears glasses     Past Surgical History:  Procedure Laterality Date   CARDIAC CATHETERIZATION N/A 10/25/2015   Procedure: Left Heart Cath and Cors/Grafts Angiography;  Surgeon: Millicent Ally, MD;  Location: [facility] INVASIVE CV LAB;  Service: Cardiovascular;  Laterality: N/A;   CARDIOVERSION  06/20/2011   Procedure: CARDIOVERSION;  Surgeon: Darlis Eisenmenger, MD;  Location: [facility] ENDOSCOPY;  Service: Cardiovascular;  Laterality: N/A;   CARDIOVERSION N/A 04/26/2015   Procedure: CARDIOVERSION;  Surgeon: Liza Riggers, MD;  Location: [facility] ENDOSCOPY;  Service: Cardiovascular;  Laterality: N/A;   CARDIOVERSION N/A 05/10/2015   Procedure: CARDIOVERSION;  Surgeon: Darlis Eisenmenger, MD;  Location: [facility] ENDOSCOPY;  Service: Cardiovascular;   Laterality: N/A;   CARDIOVERSION N/A 12/10/2018   Procedure: CARDIOVERSION;  Surgeon: Luana Rumple, MD;  Location: [facility] ENDOSCOPY;  Service: Cardiovascular;  Laterality: N/A;   CARPOMETACARPAL (CMC) FUSION OF THUMB Right 07/18/2022   Procedure: right carpo metacarpal suspensionplasty with abductor tendon transfer right wrist stenosing tenosynovitis release, right index carpo metacarpal boss excision;  Surgeon: Florida Hurter, MD;  Location: [facility] OR;  Service: Orthopedics;  Laterality: Right;   CATARACT EXTRACTION  05/2010   left eye   CATARACT EXTRACTION  04/2010   right eye   COLONOSCOPY WITH PROPOFOL  N/A 02/22/2020   Procedure: COLONOSCOPY WITH PROPOFOL ;  Surgeon: Annis Kinder, DO;  Location: [facility] ENDOSCOPY;  Service: Gastroenterology;  Laterality: N/A;   CORONARY ARTERY BYPASS GRAFT     stent   DORSAL COMPARTMENT RELEASE Right 07/18/2022   Procedure: RELEASE DORSAL COMPARTMENT (DEQUERVAIN);  Surgeon: Florida Hurter, MD;  Location: [facility] OR;  Service: Orthopedics;  Laterality: Right;  Anethesia Axillary block/ MAC   ESOPHAGOGASTRODUODENOSCOPY  03-25-2005   HEMORRHOID SURGERY N/A 09/05/2021   Procedure: HEMORRHOIDECTOMY WITH LIGATION AND HEMORRHOIDOPEXY;  Surgeon: Candyce Champagne, MD;  Location: [facility] ORS;  Service: General;  Laterality: N/A;   LEFT HEART CATH AND CORS/GRAFTS ANGIOGRAPHY N/A 02/19/2018   Procedure: LEFT HEART CATH AND CORS/GRAFTS ANGIOGRAPHY;  Surgeon: Swazil[place], Peter M, MD;  Location: [facility] INVASIVE CV LAB;  Service: Cardiovascular;  Laterality: N/A;   NASAL SEPTUM SURGERY     PERCUTANEOUS PLACEMENT INTRAVASCULAR STENT CERVICAL CAROTID ARTERY     03-2009; using a drug-eluting platform of the circumflex cornoray artery with a 3.0 x 18 [employer] Promus drug-eluting platform post dilated to 3.75 with a noncompliant balloon.   POLYPECTOMY  02/22/2020   Procedure: POLYPECTOMY;  Surgeon: Annis Kinder, DO;  Location: [facility] ENDOSCOPY;  Service: Gastroenterology;;   RECTAL EXAM UNDER  ANESTHESIA N/A 09/05/2021   Procedure: ANORECTAL EXAM UNDER ANESTHESIA;  Surgeon: Candyce Champagne, MD;  Location: [facility] ORS;  Service: General;  Laterality: N/A;   TEE WITHOUT CARDIOVERSION  06/20/2011   Procedure: TRANSESOPHAGEAL ECHOCARDIOGRAM (TEE);  Surgeon: Darlis Eisenmenger, MD;  Location: [facility] ENDOSCOPY;  Service: Cardiovascular;  Laterality: N/A;    Family History  Problem Relation Age of Onset   Stroke Mother        deceased-MINISTROKES   Lung cancer Father        deceased   Heart disease Father    Colon cancer Neg Hx    Esophageal cancer Neg Hx    Liver disease Neg Hx     Social History   Socioeconomic History   Marital status: Married    Spouse name: Art Bigness   Number of children: 5   Years of education: 14   Highest education level: Not on file  Occupational History   Occupation: retired    Associate Professor: OTHER    Comment: Physicist, medical  Tobacco  Use   Smoking status: Never   Smokeless tobacco: Never   Tobacco comments:    Never smoked 05/29/23  Vaping Use   Vaping status: Never Used  Substance and Sexual Activity   Alcohol use: Not Currently   Drug use: No   Sexual activity: Not Currently  Other Topics Concern   Not on file  Social History Narrative   Pt lives with wife. Does have stairs, but patient doesn't use them. Pt has completed technical school   Social Drivers of Health   Financial Resource Strain: Not on file  Food Insecurity: Not on file  Transportation Needs: Not on file  Physical Activity: Not on file  Stress: Not on file  Social Connections: Not on file  Intimate Partner Violence: Not on file    Outpatient Medications Prior to Visit  Medication Sig Dispense Refill   allopurinol  (ZYLOPRIM ) 100 MG tablet TAKE 2 TABLETS BY MOUTH EVERY DAY 180 tablet 0   atorvastatin  (LIPITOR ) 40 MG tablet TAKE 1 TABLET BY MOUTH EVERY DAY 90 tablet 0   benazepril  (LOTENSIN ) 10 MG tablet TAKE 1 TABLET BY MOUTH EVERY DAY 90 tablet 1   benazepril  (LOTENSIN ) 10 MG tablet  Take 1 tablet (10 mg total) by mouth daily. 90 tablet 1   benazepril  (LOTENSIN ) 5 MG tablet TAKE 1 TABLET (5 MG TOTAL) BY MOUTH DAILY. TAKE WITH BENAZEPRIL  10 MG 90 tablet 0   dicyclomine  (BENTYL ) 10 MG capsule Take 1 capsule (10 mg total) by mouth 3 (three) times daily before meals. 90 capsule 1   dofetilide  (TIKOSYN ) 500 MCG capsule TAKE 1 CAPSULE (500 MCG TOTAL) BY MOUTH 2 (TWO) TIMES DAILY. PLEASE CALL [phone] TO SCHEDULE A MARCH APPOINTMENT WITH DR. KLEIN FOR FUTURE REFILLS. THANK YOU. 180 capsule 2   ELIQUIS  5 MG TABS tablet TAKE 1 TABLET BY MOUTH TWICE A DAY 180 tablet 1   fluocinonide  gel (LIDEX ) 0.05 % Apply 1 Application topically 2 (two) times daily as needed. 60 g 1   folic acid  (FOLVITE ) 1 MG tablet TAKE 1 TABLET BY MOUTH EVERY DAY 90 tablet 1   gabapentin  (NEURONTIN ) 100 MG capsule TAKE 1 CAPSULE BY MOUTH AT BEDTIME. 90 capsule 0   HYDROcodone -acetaminophen  (NORCO/VICODIN) 5-325 MG tablet Take 1 tablet by mouth every 4 (four) hours as needed for moderate pain (pain score 4-6). 10 tablet 0   levothyroxine  (SYNTHROID ) 112 MCG tablet TAKE 1 TABLET BY MOUTH EVERY DAY BEFORE BREAKFAST 90 tablet 0   pantoprazole  (PROTONIX ) 40 MG tablet TAKE 1 TABLET BY MOUTH DAILY BEFORE BREAKFAST 90 tablet 0   sucralfate (CARAFATE) 1 GM/10ML suspension Take 10 mLs (1 g total) by mouth 4 (four) times daily. 1200 mL 3   traMADol  (ULTRAM ) 50 MG tablet TAKE 1 TABLET BY MOUTH TWICE A DAY 180 tablet 1   traMADol  (ULTRAM -ER) 200 MG 24 hr tablet TAKE 1 TABLET BY MOUTH EVERY DAY 90 tablet 1   diltiazem  (CARDIZEM  CD) 180 MG 24 hr capsule Take 1 capsule (180 mg total) by mouth daily. 90 capsule 2   No facility-administered medications prior to visit.    No Known Allergies  ROS     Objective:     Physical Exam Constitutional:      Appearance: Normal appearance.  Cardiovascular:     Rate and Rhythm: Normal rate.  Pulmonary:     Effort: Pulmonary effort is normal.  Skin:    Comments: Skin ulcer  noted upper back  Scabbed skin tear right distal small finger-  well healing  Neurological:     Mental Status: He is alert and oriented to person, place, and time.  Psychiatric:        Mood and Affect: Mood normal.        Behavior: Behavior normal.        Thought Content: Thought content normal.        Judgment: Judgment normal.       BP 134/71 (BP Location: Right Arm, Patient Position: Sitting, Cuff Size: Large)   Pulse 81   Temp 98.6 F (37 C) (Oral)   Resp 16   Ht 6\' 3"  (1.905 m)   Wt 242 lb (109.8 kg)   SpO2 96%   BMI 30.25 kg/m  Wt Readings from Last 3 Encounters:  08/12/23 242 lb (109.8 kg)  06/17/23 243 lb (110.2 kg)  05/29/23 250 lb (113.4 kg)       Assessment & Plan:   Problem List Items Addressed This Visit       Unprioritized   Abscess - Primary   - recurrent abscess upper back.  Suspect secondary to epidermal inclusion cyst. - Prescribed Keflext hree times daily for one week. - Referred to surgeon for evaluation - Advised to cover wound with bandage and apply antibiotic ointment daily.       Relevant Medications   cephALEXin  (KEFLEX ) 500 MG capsule   Other Relevant Orders   Ambulatory referral to General Surgery    I am having [name] start on cephALEXin . I am also having him maintain his diltiazem , fluocinonide  gel, HYDROcodone -acetaminophen , folic acid , benazepril , traMADol , traMADol , allopurinol , atorvastatin , gabapentin , Eliquis , pantoprazole , levothyroxine , benazepril , benazepril , dicyclomine , dofetilide , and sucralfate .  Meds ordered this encounter  Medications   cephALEXin  (KEFLEX ) 500 MG capsule    Sig: Take 1 capsule (500 mg total) by mouth 3 (three) times daily.    Dispense:  21 capsule    Refill:  0    Supervising Provider:   Randie Bustle A [[id number]]

## 2023-08-12 NOTE — Patient Instructions (Signed)
 VISIT SUMMARY:  You visited us  today for a persistent back wound that has been bleeding intermittently and abdominal pain related to eating. We discussed treatment options and next steps for both issues.  YOUR PLAN:  CHRONIC BACK WOUND: You have a chronic wound on your back that bleeds intermittently and causes numbness. There may be an underlying sac causing these issues. -Take the prescribed antibiotic three times daily for one week. -We have referred you to a surgeon for evaluation and possible removal of the sac. -Continue to cover the wound with a bandage and apply antibiotic ointment daily.  ABDOMINAL PAIN RELATED TO EATING: You experience abdominal pain when eating, and you are currently taking Carafate to help with this issue. -Continue taking Carafate as prescribed. -If the symptoms persist, we may need to consider an endoscopy.

## 2023-08-13 DIAGNOSIS — L0291 Cutaneous abscess, unspecified: Secondary | ICD-10-CM | POA: Insufficient documentation

## 2023-08-13 NOTE — Addendum Note (Signed)
 Addended by: Dorrene Gaucher on: 08/13/2023 02:26 PM   Modules accepted: Level of Service

## 2023-08-13 NOTE — Assessment & Plan Note (Signed)
-   recurrent abscess upper back.  Suspect secondary to epidermal inclusion cyst. - Prescribed Keflext hree times daily for one week. - Referred to surgeon for evaluation - Advised to cover wound with bandage and apply antibiotic ointment daily.

## 2023-08-14 ENCOUNTER — Other Ambulatory Visit: Payer: Self-pay | Admitting: Pediatrics

## 2023-08-14 ENCOUNTER — Telehealth: Payer: Self-pay | Admitting: *Deleted

## 2023-08-14 DIAGNOSIS — L988 Other specified disorders of the skin and subcutaneous tissue: Secondary | ICD-10-CM | POA: Diagnosis not present

## 2023-08-14 DIAGNOSIS — Z7901 Long term (current) use of anticoagulants: Secondary | ICD-10-CM | POA: Diagnosis not present

## 2023-08-14 NOTE — Telephone Encounter (Signed)
 Pharmacy please advise on holding Apixaban  prior to POSTERIOR RIGHT SHOULDER MASS EXCISION  scheduled for TBD. Thank you.

## 2023-08-14 NOTE — Telephone Encounter (Signed)
   Pre-operative Risk Assessment    Patient Name: [name]  DOB: 1945/08/02 MRN: [id number]   Date of last office visit: 03/05/23 TELE PREOP APPT; 06/05/22 [name], PAC Date of next office visit: NONE   Request for Surgical Clearance    Procedure:  POSTERIOR RIGHT SHOULDER MASS EXCISION   Date of Surgery:  Clearance TBD                                Surgeon:  DR. Armond Bertin Surgeon's Group or Practice Name:  [facility] Phone number:  [phone] Fax number:  [phone] [name], [name]   Type of Clearance Requested:   - Medical  - Pharmacy:  Hold Apixaban  (Eliquis )     Type of Anesthesia:  General    Additional requests/questions:    [name]   08/14/2023, 3:14 PM

## 2023-08-19 NOTE — Telephone Encounter (Signed)
 Attempted to reach the patient but a message states" calls to this number are being screened by smart call blocker. The number you are calling is not accepting your call. Please hang up!"   Attempted to reach the patients wife but no answer. Left message on spouse voicemail requesting a call back to set up telehealth appointment for cardiac clearance.

## 2023-08-19 NOTE — Telephone Encounter (Signed)
   Name: [name]  DOB: [birth year]  MRN: [id number]  Primary Cardiologist: Dorothye Gathers, MD   Preoperative team, please contact this patient and set up a phone call appointment for further preoperative risk assessment. Please obtain consent and complete medication review. Thank you for your help.  I confirm that guidance regarding antiplatelet and oral anticoagulation therapy has been completed and, if necessary, noted below.  Per office protocol, patient can hold Eliquis  for 2 days prior to procedure.    I also confirmed the patient resides in the state of [place] . As per [facility] Medical Board telemedicine laws, the patient must reside in the state in which the provider is licensed.   Francene Ing, Retha Cast, NP 08/19/2023, 8:23 AM [facility]

## 2023-08-19 NOTE — Telephone Encounter (Signed)
 Patient with diagnosis of afib on Eliquis  for anticoagulation.    Procedure: POSTERIOR RIGHT SHOULDER MASS EXCISION  Date of procedure: TBD   CHA2DS2-VASc Score = 8   This indicates a 10.8% annual risk of stroke. The patient's score is based upon: CHF History: 1 HTN History: 1 Diabetes History: 1 Stroke History: 2 Vascular Disease History: 1 Age Score: 2 Gender Score: 0      CrCl 73 ml/min Platelet count 147  Patient has not had an Afib/aflutter ablation within the last 3 months or DCCV within the last 30 days  Per office protocol, patient can hold Eliquis  for 2 days prior to procedure.    **This guidance is not considered finalized until pre-operative APP has relayed final recommendations.**

## 2023-08-20 ENCOUNTER — Ambulatory Visit (INDEPENDENT_AMBULATORY_CARE_PROVIDER_SITE_OTHER): Admitting: Podiatry

## 2023-08-20 DIAGNOSIS — E1151 Type 2 diabetes mellitus with diabetic peripheral angiopathy without gangrene: Secondary | ICD-10-CM

## 2023-08-20 DIAGNOSIS — I70209 Unspecified atherosclerosis of native arteries of extremities, unspecified extremity: Secondary | ICD-10-CM

## 2023-08-20 DIAGNOSIS — B351 Tinea unguium: Secondary | ICD-10-CM

## 2023-08-20 DIAGNOSIS — M79671 Pain in right foot: Secondary | ICD-10-CM | POA: Diagnosis not present

## 2023-08-20 DIAGNOSIS — M79672 Pain in left foot: Secondary | ICD-10-CM | POA: Diagnosis not present

## 2023-08-20 NOTE — Progress Notes (Signed)
 Patient presents for evaluation and treatment of tenderness and some redness around nails feet.  Tenderness around toes with walking and wearing shoes.  Physical exam:  General appearance: Alert, pleasant, and in no acute distress.  Vascular: Pedal pulses: DP 2/4 B/L, PT 0/4 B/L.  Moderate edema lower legs bilaterally  Neurological:  No paresthesias or burning noted in feet  Dermatologic:  Nails thickened, disfigured, discolored 1-5 BL with subungual debris.  Redness and hypertrophic nail folds along nail folds bilaterally but no signs of drainage or infection.  Musculoskeletal:  Hammertoes 2 through 5 bilaterally with mild hallux abductovalgus deformities.   Diagnosis: 1. 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 2. Pain toes 1 through 5 bilaterally. 3.  Diabetes mellitus type 2 with PVD  Plan: Debrided onychomycotic nails 1 through 5 bilaterally.  Return 3 months

## 2023-08-21 NOTE — Telephone Encounter (Signed)
 2nd attempt to call patient and unable to leave message. Message stats calls are being screened and need to hang up.

## 2023-08-25 NOTE — Telephone Encounter (Signed)
 3rd attempt to schedule preop tele visit, messaged stated that the call cannot be completed at this time. Will update surgeons office and remove from preop pool

## 2023-08-26 ENCOUNTER — Telehealth: Payer: Self-pay | Admitting: *Deleted

## 2023-08-26 NOTE — Telephone Encounter (Signed)
 Pt has been scheduled tele preop appt 08/28/23. Pt said surgeon waiting on clearance before scheduling surgery. Med rec and consent are done.      Patient Consent for Virtual Visit        [name] has provided verbal consent on 08/26/2023 for a virtual visit (video or telephone).   CONSENT FOR VIRTUAL VISIT FOR:  [name]  By participating in this virtual visit I agree to the following:  I hereby voluntarily request, consent and authorize [facility] and its employed or contracted physicians, physician assistants, nurse practitioners or other licensed health care professionals (the Practitioner), to provide me with telemedicine health care services (the "Services) as deemed necessary by the treating Practitioner. I acknowledge and consent to receive the Services by the Practitioner via telemedicine. I understand that the telemedicine visit will involve communicating with the Practitioner through live audiovisual communication technology and the disclosure of certain medical information by electronic transmission. I acknowledge that I have been given the opportunity to request an in-person assessment or other available alternative prior to the telemedicine visit and am voluntarily participating in the telemedicine visit.  I understand that I have the right to withhold or withdraw my consent to the use of telemedicine in the course of my care at any time, without affecting my right to future care or treatment, and that the Practitioner or I may terminate the telemedicine visit at any time. I understand that I have the right to inspect all information obtained and/or recorded in the course of the telemedicine visit and may receive copies of available information for a reasonable fee.  I understand that some of the potential risks of receiving the Services via telemedicine include:  Delay or interruption in medical evaluation due to technological equipment failure or  disruption; Information transmitted may not be sufficient (e.g. poor resolution of images) to allow for appropriate medical decision making by the Practitioner; and/or  In rare instances, security protocols could fail, causing a breach of personal health information.  Furthermore, I acknowledge that it is my responsibility to provide information about my medical history, conditions and care that is complete and accurate to the best of my ability. I acknowledge that Practitioner's advice, recommendations, and/or decision may be based on factors not within their control, such as incomplete or inaccurate data provided by me or distortions of diagnostic images or specimens that may result from electronic transmissions. I understand that the practice of medicine is not an exact science and that Practitioner makes no warranties or guarantees regarding treatment outcomes. I acknowledge that a copy of this consent can be made available to me via my patient portal [name]), or I can request a printed copy by calling the office of [facility].    I understand that my insurance will be billed for this visit.   I have read or had this consent read to me. I understand the contents of this consent, which adequately explains the benefits and risks of the Services being provided via telemedicine.  I have been provided ample opportunity to ask questions regarding this consent and the Services and have had my questions answered to my satisfaction. I give my informed consent for the services to be provided through the use of telemedicine in my medical care

## 2023-08-26 NOTE — Telephone Encounter (Signed)
 Pt has been scheduled tele preop appt 08/28/23. Pt said surgeon waiting on clearance before scheduling surgery. Med rec and consent are done.

## 2023-08-28 ENCOUNTER — Encounter: Payer: Self-pay | Admitting: Nurse Practitioner

## 2023-08-28 ENCOUNTER — Ambulatory Visit: Attending: Cardiovascular Disease | Admitting: Nurse Practitioner

## 2023-08-28 DIAGNOSIS — Z01810 Encounter for preprocedural cardiovascular examination: Secondary | ICD-10-CM | POA: Insufficient documentation

## 2023-08-28 NOTE — Progress Notes (Signed)
 Virtual Visit via Telephone Note   Because of [name] co-morbid illnesses, he is at least at moderate risk for complications without adequate follow up.  This format is felt to be most appropriate for this patient at this time.  Due to technical limitations with video connection (technology), today's appointment will be conducted as an audio only telehealth visit, and [name] verbally agreed to proceed in this manner.   All issues noted in this document were discussed and addressed.  No physical exam could be performed with this format.  Evaluation Performed:  Preoperative cardiovascular risk assessment _____________   Date:  08/28/2023   Patient ID:  [name], DOB Apr 13, 1945, MRN [id number] Patient Location:  Home Provider location:   Office  Primary Care Provider:  Dorrene Gaucher, NP Primary Cardiologist:  Dorothye Gathers, MD  Chief Complaint / Patient Profile   78 y.o. y/o male with a h/o CAD s/p CABG, prior CVA, chronic HFpEF, chest wall hematoma, amiodarone  lung toxicity, persistent atrial fibrillation on chronic anticoagulation who is pending posterior right shoulder mass excision with Dr. Davonna Estes on date TBD and presents today for telephonic preoperative cardiovascular risk assessment.  History of Present Illness    [name] is a 78 y.o. male who presents via audio/video conferencing for a telehealth visit today.  Pt was last seen in cardiology clinic on 05/29/23 by Myrtha Ates, PA.  At that time BASIR NIVEN was doing well.  The patient is now pending procedure as outlined above. Since his last visit, he denies chest pain, shortness of breath, lower extremity edema, fatigue, palpitations, melena, hematuria, hemoptysis, diaphoresis, weakness, presyncope, syncope, orthopnea, and PND. He is able to achieve > 4 METS Activity without concerning cardiac symptoms.    Past Medical History    Past Medical History:  Diagnosis Date   Acute  encephalopathy 12/12/2015   Acute respiratory failure with hypoxia and hypercapnia (HCC) 12/12/2015   Allergic rhinitis 02/13/2010   Qualifier: Diagnosis of  By: Marthe Slain    Angina at rest [facility]) 10/22/2015   Arthritis    Ataxia 08/10/2013   Atrial fibrillation (HCC) 11/2018   B12 deficiency 08/11/2013   BACK PAIN, LUMBAR 01/29/2009   Qualifier: Diagnosis of  By: Marthe Slain    BENIGN POSITIONAL VERTIGO 11/23/2009   Qualifier: Diagnosis of  By: Marthe Slain    Chronic diastolic heart failure (HCC) 04/24/2015   Chronic pain    left sided-Kristeins   Chronically elevated hemidiaphragm    right   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/04/2015   s/p CABG 2004; CFX DES 2011   Elevated troponin    Essential hypertension 12/12/2006   Qualifier: Diagnosis of  By: Georganne Kind RMA, Lucy     GERD 12/12/2006   Qualifier: Diagnosis of  By: Ana Balling, Lucy     GOUT 12/12/2006   Qualifier: Diagnosis of  By: Georganne Kind RMA, Lucy     Hemorrhoids    Hyperlipidemia LDL goal <70 12/12/2006   Qualifier: Diagnosis of  By: Georganne Kind RMA, Lucy      Hypogonadism male 04/10/2011   Hypothyroidism, acquired 02/15/2013   Insomnia 05/21/2013   Long term current use of anticoagulant therapy 06/24/2011   Myocardial infarction (HCC)    Obstructive sleep apnea-Failed CPAP 06/17/2007   Failed CPAP Using O 2 for sleep    Paroxysmal atrial fibrillation (HCC) 12/06/2016   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  also 1996; resultant hemiplegia of dominant side   Thoracic aorta atherosclerosis (HCC) 04/27/2017   Tubular adenoma of colon 05/2010   Type 2 diabetes mellitus without complication, without long-term current use of insulin  (HCC) 10/05/2007   Qualifier: Diagnosis of  By: Marthe Slain    Vasovagal syncope    Vasovagal syncope    Wears glasses    Past Surgical History:  Procedure Laterality Date   CARDIAC CATHETERIZATION N/A 10/25/2015   Procedure: Left  Heart Cath and Cors/Grafts Angiography;  Surgeon: Millicent Ally, MD;  Location: [facility] INVASIVE CV LAB;  Service: Cardiovascular;  Laterality: N/A;   CARDIOVERSION  06/20/2011   Procedure: CARDIOVERSION;  Surgeon: Darlis Eisenmenger, MD;  Location: [facility] ENDOSCOPY;  Service: Cardiovascular;  Laterality: N/A;   CARDIOVERSION N/A 04/26/2015   Procedure: CARDIOVERSION;  Surgeon: Liza Riggers, MD;  Location: [facility] ENDOSCOPY;  Service: Cardiovascular;  Laterality: N/A;   CARDIOVERSION N/A 05/10/2015   Procedure: CARDIOVERSION;  Surgeon: Darlis Eisenmenger, MD;  Location: [facility] ENDOSCOPY;  Service: Cardiovascular;  Laterality: N/A;   CARDIOVERSION N/A 12/10/2018   Procedure: CARDIOVERSION;  Surgeon: Luana Rumple, MD;  Location: [facility] ENDOSCOPY;  Service: Cardiovascular;  Laterality: N/A;   CARPOMETACARPAL (CMC) FUSION OF THUMB Right 07/18/2022   Procedure: right carpo metacarpal suspensionplasty with abductor tendon transfer right wrist stenosing tenosynovitis release, right index carpo metacarpal boss excision;  Surgeon: Florida Hurter, MD;  Location: [facility] OR;  Service: Orthopedics;  Laterality: Right;   CATARACT EXTRACTION  05/2010   left eye   CATARACT EXTRACTION  04/2010   right eye   COLONOSCOPY WITH PROPOFOL  N/A 02/22/2020   Procedure: COLONOSCOPY WITH PROPOFOL ;  Surgeon: Annis Kinder, DO;  Location: [facility] ENDOSCOPY;  Service: Gastroenterology;  Laterality: N/A;   CORONARY ARTERY BYPASS GRAFT     stent   DORSAL COMPARTMENT RELEASE Right 07/18/2022   Procedure: RELEASE DORSAL COMPARTMENT (DEQUERVAIN);  Surgeon: Florida Hurter, MD;  Location: [facility] OR;  Service: Orthopedics;  Laterality: Right;  Anethesia Axillary block/ MAC   ESOPHAGOGASTRODUODENOSCOPY  03-25-2005   HEMORRHOID SURGERY N/A 09/05/2021   Procedure: HEMORRHOIDECTOMY WITH LIGATION AND HEMORRHOIDOPEXY;  Surgeon: Candyce Champagne, MD;  Location: [facility] ORS;  Service: General;  Laterality: N/A;   LEFT HEART CATH AND CORS/GRAFTS ANGIOGRAPHY N/A 02/19/2018    Procedure: LEFT HEART CATH AND CORS/GRAFTS ANGIOGRAPHY;  Surgeon: Swazil[place], Peter M, MD;  Location: [facility] INVASIVE CV LAB;  Service: Cardiovascular;  Laterality: N/A;   NASAL SEPTUM SURGERY     PERCUTANEOUS PLACEMENT INTRAVASCULAR STENT CERVICAL CAROTID ARTERY     03-2009; using a drug-eluting platform of the circumflex cornoray artery with a 3.0 x 18 [employer] Promus drug-eluting platform post dilated to 3.75 with a noncompliant balloon.   POLYPECTOMY  02/22/2020   Procedure: POLYPECTOMY;  Surgeon: Annis Kinder, DO;  Location: [facility] ENDOSCOPY;  Service: Gastroenterology;;   RECTAL EXAM UNDER ANESTHESIA N/A 09/05/2021   Procedure: ANORECTAL EXAM UNDER ANESTHESIA;  Surgeon: Candyce Champagne, MD;  Location: [facility] ORS;  Service: General;  Laterality: N/A;   TEE WITHOUT CARDIOVERSION  06/20/2011   Procedure: TRANSESOPHAGEAL ECHOCARDIOGRAM (TEE);  Surgeon: Darlis Eisenmenger, MD;  Location: [facility] ENDOSCOPY;  Service: Cardiovascular;  Laterality: N/A;    Allergies  No Known Allergies  Home Medications    Prior to Admission medications   Medication Sig Start Date End Date Taking? Authorizing Provider  allopurinol  (ZYLOPRIM ) 100 MG tablet TAKE 2 TABLETS BY MOUTH EVERY DAY 05/01/23   O'Sullivan, Melissa, NP  atorvastatin  (LIPITOR ) 40 MG  tablet TAKE 1 TABLET BY MOUTH EVERY DAY 06/15/23   O'Sullivan, Melissa, NP  benazepril  (LOTENSIN ) 10 MG tablet TAKE 1 TABLET BY MOUTH EVERY DAY 03/09/23   O'Sullivan, Melissa, NP  benazepril  (LOTENSIN ) 10 MG tablet Take 1 tablet (10 mg total) by mouth daily. 06/17/23   O'Sullivan, Melissa, NP  benazepril  (LOTENSIN ) 5 MG tablet TAKE 1 TABLET (5 MG TOTAL) BY MOUTH DAILY. TAKE WITH BENAZEPRIL  10 MG 06/17/23   O'Sullivan, Melissa, NP  cephALEXin  (KEFLEX ) 500 MG capsule Take 1 capsule (500 mg total) by mouth 3 (three) times daily. Patient not taking: Reported on 08/26/2023 08/12/23   O'Sullivan, Melissa, NP  dicyclomine  (BENTYL ) 10 MG capsule TAKE 1 CAPSULE (10 MG TOTAL) BY MOUTH 3 (THREE)  TIMES DAILY BEFORE MEALS. 08/14/23   Truddie Furrow, MD  diltiazem  (CARDIZEM  CD) 180 MG 24 hr capsule Take 1 capsule (180 mg total) by mouth daily. 10/21/22 08/26/23  Fenton, Clint R, PA  dofetilide  (TIKOSYN ) 500 MCG capsule TAKE 1 CAPSULE (500 MCG TOTAL) BY MOUTH 2 (TWO) TIMES DAILY. PLEASE CALL [phone] TO SCHEDULE A MARCH APPOINTMENT WITH DR. KLEIN FOR FUTURE REFILLS. THANK YOU. 07/17/23   Fenton, Clint R, PA  ELIQUIS  5 MG TABS tablet TAKE 1 TABLET BY MOUTH TWICE A DAY 06/17/23   Hugh Madura, MD  fluocinonide  gel (LIDEX ) 0.05 % Apply 1 Application topically 2 (two) times daily as needed. Patient not taking: Reported on 08/26/2023 11/26/22   Dorrene Gaucher, NP  folic acid  (FOLVITE ) 1 MG tablet TAKE 1 TABLET BY MOUTH EVERY DAY Patient not taking: Reported on 08/26/2023 01/20/23   O'Sullivan, Melissa, NP  gabapentin  (NEURONTIN ) 100 MG capsule TAKE 1 CAPSULE BY MOUTH AT BEDTIME. 06/15/23   O'Sullivan, Melissa, NP  HYDROcodone -acetaminophen  (NORCO/VICODIN) 5-325 MG tablet Take 1 tablet by mouth every 4 (four) hours as needed for moderate pain (pain score 4-6). Patient not taking: Reported on 08/26/2023 01/02/23   Ballard Bongo, MD  levothyroxine  (SYNTHROID ) 112 MCG tablet TAKE 1 TABLET BY MOUTH EVERY DAY BEFORE BREAKFAST 06/17/23   O'Sullivan, Melissa, NP  pantoprazole  (PROTONIX ) 40 MG tablet TAKE 1 TABLET BY MOUTH DAILY BEFORE BREAKFAST 06/17/23   Dorrene Gaucher, NP  sucralfate  (CARAFATE ) 1 GM/10ML suspension Take 10 mLs (1 g total) by mouth 4 (four) times daily. Patient not taking: Reported on 08/26/2023 08/11/23 08/10/24  Truddie Furrow, MD  traMADol  (ULTRAM ) 50 MG tablet TAKE 1 TABLET BY MOUTH TWICE A DAY 03/17/23   Kirsteins, Cecilia Coe, MD  traMADol  (ULTRAM -ER) 200 MG 24 hr tablet TAKE 1 TABLET BY MOUTH EVERY DAY 03/17/23   Kirsteins, Cecilia Coe, MD    Physical Exam    Vital Signs:  MITUL HALLOWELL does not have vital signs available for review today.  Given telephonic nature of  communication, physical exam is limited. AAOx3. NAD. Normal affect.  Speech and respirations are unlabored.  Accessory Clinical Findings    None  Assessment & Plan    1.  Preoperative Cardiovascular Risk Assessment: According to the Revised Cardiac Risk Index (RCRI), his Perioperative Risk of Major Cardiac Event is (%): 6.6. His Functional Capacity in METs is: 5.07 according to the Duke Activity Status Index (DASI). The patient is doing well from a cardiac perspective. Therefore, based on ACC/AHA guidelines, the patient would be at acceptable risk for the planned procedure without further cardiovascular testing.   The patient was advised that if he develops new symptoms prior to surgery to contact our office to arrange for a follow-up  visit, and he verbalized understanding.  Per office protocol, patient can hold Eliquis  for 2 days prior to procedure and should resume as soon as hemodynamically stable post procedure.  A copy of this note will be routed to requesting surgeon.  Time:   Today, I have spent 10 minutes with the patient with telehealth technology discussing medical history, symptoms, and management plan.     Gerldine Koch, NP-C  08/28/2023, 3:05 PM [address], [address] [place], [place] [address] Office [phone] Fax [phone]

## 2023-09-01 ENCOUNTER — Encounter: Payer: Self-pay | Admitting: Physical Medicine & Rehabilitation

## 2023-09-01 ENCOUNTER — Encounter: Attending: Physical Medicine & Rehabilitation | Admitting: Physical Medicine & Rehabilitation

## 2023-09-01 VITALS — BP 131/78 | HR 83 | Ht 75.0 in | Wt 246.0 lb

## 2023-09-01 DIAGNOSIS — I69352 Hemiplegia and hemiparesis following cerebral infarction affecting left dominant side: Secondary | ICD-10-CM | POA: Diagnosis not present

## 2023-09-01 DIAGNOSIS — G8110 Spastic hemiplegia affecting unspecified side: Secondary | ICD-10-CM | POA: Insufficient documentation

## 2023-09-01 MED ORDER — SODIUM CHLORIDE (PF) 0.9 % IJ SOLN
4.0000 mL | Freq: Once | INTRAMUSCULAR | Status: AC
  Administered 2023-09-01: 4 mL via INTRAVENOUS

## 2023-09-01 MED ORDER — TRAMADOL HCL 50 MG PO TABS: 50.0000 mg | ORAL_TABLET | Freq: Two times a day (BID) | ORAL | 1 refills | Status: DC

## 2023-09-01 MED ORDER — TRAMADOL HCL ER 200 MG PO TB24: 200.0000 mg | ORAL_TABLET | Freq: Every day | ORAL | 1 refills | Status: DC

## 2023-09-01 MED ORDER — ONABOTULINUMTOXINA 100 UNITS IJ SOLR
300.0000 [IU] | Freq: Once | INTRAMUSCULAR | Status: AC
  Administered 2023-09-01: 300 [IU] via INTRAMUSCULAR

## 2023-09-01 NOTE — Progress Notes (Signed)
Botox Injection for spasticity using needle EMG guidance  Dilution: 50 Units/ml Indication: Severe spasticity which interferes with ADL,mobility and/or  hygiene and is unresponsive to medication management and other conservative care Informed consent was obtained after describing risks and benefits of the procedure with the patient. This includes bleeding, bruising, infection, excessive weakness, or medication side effects. A REMS form is on file and signed. Needle: 27g 1" needle electrode Number of units per muscle LEFT UE Biceps50  LEFT LE  Medial Hamstrings250 All injections were done after obtaining appropriate EMG activity and after negative drawback for blood. The patient tolerated the procedure well. Post procedure instructions were given. A followup appointment was made.     

## 2023-09-01 NOTE — Patient Instructions (Signed)

## 2023-09-07 ENCOUNTER — Ambulatory Visit: Payer: Self-pay | Admitting: General Surgery

## 2023-09-07 ENCOUNTER — Other Ambulatory Visit: Payer: Self-pay

## 2023-09-07 ENCOUNTER — Encounter (HOSPITAL_COMMUNITY): Payer: Self-pay | Admitting: General Surgery

## 2023-09-07 NOTE — Anesthesia Preprocedure Evaluation (Addendum)
 Anesthesia Evaluation    Airway Mallampati: III  TM Distance: >3 FB Neck ROM: Full    Dental no notable dental hx.    Pulmonary sleep apnea and Oxygen  sleep apnea    Pulmonary exam normal        Cardiovascular hypertension, Pt. on medications + CAD, + Past MI, + CABG (2004) and +CHF   Rhythm:Regular Rate:Normal  Left ventricle: The cavity size was normal. There was mild focal    basal hypertrophy of the septum. Systolic function was normal.    The estimated ejection fraction was in the range of 55% to 60%.    Wall motion was normal; there were no regional wall motion    abnormalities. Doppler parameters are consistent with abnormal    left ventricular relaxation (grade 1 diastolic dysfunction).    Doppler parameters are consistent with elevated ventricular    end-diastolic filling pressure.  - Mitral valve: Calcified annulus. Mildly thickened leaflets .    There was mild regurgitation.  - Left atrium: The atrium was mildly dilated.  - Right ventricle: The cavity size was mildly dilated. Wall    thickness was normal. Systolic function was mildly reduced.  - Right atrium: The atrium was normal in size.  - Tricuspid valve: There was mild regurgitation.  - Pulmonary arteries: Systolic pressure was within the normal    range.  - Inferior vena cava: The vessel was normal in size.  - Pericardium, extracardiac: There was no pericardial effusion.     Neuro/Psych CVA, Residual Symptoms  negative psych ROS   GI/Hepatic Neg liver ROS,GERD  ,,  Endo/Other  diabetesHypothyroidism    Renal/GU   negative genitourinary   Musculoskeletal  (+) Arthritis , Osteoarthritis,    Abdominal Normal abdominal exam  (+)   Peds  Hematology  (+) Blood dyscrasia, anemia   Anesthesia Other Findings   Reproductive/Obstetrics                             Anesthesia Physical Anesthesia Plan  ASA: 3  Anesthesia Plan:  General   Post-op Pain Management: Minimal or no pain anticipated   Induction: Intravenous  PONV Risk Score and Plan: 1 and Ondansetron , Dexamethasone , Treatment may vary due to age or medical condition and Propofol  infusion  Airway Management Planned: LMA and Oral ETT  Additional Equipment: None  Intra-op Plan:   Post-operative Plan: Extubation in OR  Informed Consent: I have reviewed the patients History and Physical, chart, labs and discussed the procedure including the risks, benefits and alternatives for the proposed anesthesia with the patient or authorized representative who has indicated his/her understanding and acceptance.     Dental advisory given  Plan Discussed with: CRNA  Anesthesia Plan Comments: (See PAT note 09/07/23     DISCUSSION:78 y.o. never smoker with h/o HTN, OSA uses 2L O2 at night, hypothyroidism, DM II, stroke, atrial fibrillation, CAD s/p CABG, HFpEF, skin lesion of back scheduled for above procedure 09/08/2023 with Dr. Cordella Idler.    Per cardiology preoperative evaluation 08/28/2023, Preoperative Cardiovascular Risk Assessment: According to the Revised Cardiac Risk Index (RCRI), his Perioperative Risk of Major Cardiac Event is (%): 6.6. His Functional Capacity in METs is: 5.07 according to the Duke Activity Status Index (DASI). The patient is doing well from a cardiac perspective. Therefore, based on ACC/AHA guidelines, the patient would be at acceptable risk for the planned procedure without further cardiovascular testing.    Echo 02/18/2018 - Left  ventricle: The cavity size was normal. There was mild focal    basal hypertrophy of the septum. Systolic function was normal.    The estimated ejection fraction was in the range of 55% to 60%.    Wall motion was normal; there were no regional wall motion    abnormalities. Doppler parameters are consistent with abnormal    left ventricular relaxation (grade 1 diastolic dysfunction).    Doppler  parameters are consistent with elevated ventricular    end-diastolic filling pressure.  - Mitral valve: Calcified annulus. Mildly thickened leaflets .    There was mild regurgitation.  - Left atrium: The atrium was mildly dilated.  - Right ventricle: The cavity size was mildly dilated. Wall    thickness was normal. Systolic function was mildly reduced.  - Right atrium: The atrium was normal in size.  - Tricuspid valve: There was mild regurgitation.  - Pulmonary arteries: Systolic pressure was within the normal    range.  - Inferior vena cava: The vessel was normal in size.  - Pericardium, extracardiac: There was no pericardial effusion.    Myocardial Perfusion 05/07/2017  Nuclear stress EF: 53%.  There was no ST segment deviation noted during stress.  Small inferolateral defect (mild/moderate intensity) consistent with soft tissue attenuation and/or subendocardial scar. No ischemia  This is a low risk study. )       Anesthesia Quick Evaluation

## 2023-09-07 NOTE — Progress Notes (Signed)
 Date of COVID positive in last 90 days:  PCP - Eleanor Ponto, NP Cardiologist - Oneil Parchment, MD Pulmonologist - Donnice Beals, MD  Cardiac clearance in Epic dated 08-28-23  Chest x-ray - N/A EKG - 05-29-23 Epic Stress Test - 05-07-14 Epic ECHO - 02-18-18 Epic Cardiac Cath - 02-19-18 Epic Pacemaker/ICD device last checked: N/A Spinal Cord Stimulator: N/A Long Term Monitor 03-15-19 Epic  Bowel Prep - N/A  Sleep Study - Yes CPAP - No, uses 2L of oxygen  at night   Fasting Blood Sugar - does not check Checks Blood Sugar _____ times a day  Last dose of GLP1 agonist-  N/A GLP1 instructions:  Do not take after     Last dose of SGLT-2 inhibitors-  N/A SGLT-2 instructions:  Do not take after    Blood Thinner Instructions:  Eliquis  (hold x2 days) Last dose:  09-04-23  Aspirin  Instructions: Last Dose:  Activity level:  Can go up a flight of stairs and perform activities of daily living without stopping and without symptoms of chest pain or shortness of breath.  Some limitations due to L sided weakness from stroke.   Anesthesia review: CAD with hx of CABG, Afib, HTN, CHF, OSA, DM, hx of CVA with L,  O2 at 2L at night for sleep apnea  Patient denies shortness of breath, fever, cough and chest pain at PAT appointment (completed over the phone)  Patient verbalized understanding of instructions that were given to them at the PAT appointment. Patient was also instructed that they will need to review over the PAT instructions again at home before surgery.

## 2023-09-07 NOTE — Progress Notes (Signed)
 Anesthesia Chart Review   Case: [id number] Date/Time: 09/08/23 1315   Procedure: EXCISION, LESION, BACK - EXCISION OF LESION UPPER MID BACK   Anesthesia type: General   Diagnosis: Skin lesion of back [L98.9]   Pre-op diagnosis: SKIN LESION OF BACK   Location: [facility] ROOM 07 / [facility]   Surgeons: Polly Cordella LABOR, MD       DISCUSSION:78 y.o. never smoker with h/o HTN, OSA uses 2L O2 at night, hypothyroidism, DM II, stroke, atrial fibrillation, CAD s/p CABG, HFpEF, skin lesion of back scheduled for above procedure 09/08/2023 with Dr. Cordella Polly.   Per cardiology preoperative evaluation 08/28/2023, Preoperative Cardiovascular Risk Assessment: According to the Revised Cardiac Risk Index (RCRI), his Perioperative Risk of Major Cardiac Event is (%): 6.6. His Functional Capacity in METs is: 5.07 according to the Duke Activity Status Index (DASI). The patient is doing well from a cardiac perspective. Therefore, based on ACC/AHA guidelines, the patient would be at acceptable risk for the planned procedure without further cardiovascular testing.    The patient was advised that if he develops new symptoms prior to surgery to contact our office to arrange for a follow-up visit, and he verbalized understanding.   Per office protocol, patient can hold Eliquis  for 2 days prior to procedure and should resume as soon as hemodynamically stable post procedure.  Same day workup, pt not seen in PAT clinic. Labs DOS, evaluate DOS.  VS: Ht 6' 3 (1.905 m)   Wt 111.1 kg   BMI 30.62 kg/m   PROVIDERS: Daryl Setter, NP is PCP   Primary Cardiologist:  Oneil Parchment, MD  LABS: labs DOS (all labs ordered are listed, but only abnormal results are displayed)  Labs Reviewed - No data to display   IMAGES:   EKG:   CV: Echo 02/18/2018 - Left ventricle: The cavity size was normal. There was mild focal    basal hypertrophy of the septum. Systolic function was normal.    The estimated ejection fraction  was in the range of 55% to 60%.    Wall motion was normal; there were no regional wall motion    abnormalities. Doppler parameters are consistent with abnormal    left ventricular relaxation (grade 1 diastolic dysfunction).    Doppler parameters are consistent with elevated ventricular    end-diastolic filling pressure.  - Mitral valve: Calcified annulus. Mildly thickened leaflets .    There was mild regurgitation.  - Left atrium: The atrium was mildly dilated.  - Right ventricle: The cavity size was mildly dilated. Wall    thickness was normal. Systolic function was mildly reduced.  - Right atrium: The atrium was normal in size.  - Tricuspid valve: There was mild regurgitation.  - Pulmonary arteries: Systolic pressure was within the normal    range.  - Inferior vena cava: The vessel was normal in size.  - Pericardium, extracardiac: There was no pericardial effusion.   Myocardial Perfusion 05/07/2017   Nuclear stress EF: 53%. There was no ST segment deviation noted during stress. Small inferolateral defect (mild/moderate intensity) consistent with soft tissue attenuation and/or subendocardial scar. No ischemia This is a low risk study. Past Medical History:  Diagnosis Date   Acute encephalopathy 12/12/2015   Acute respiratory failure with hypoxia and hypercapnia (HCC) 12/12/2015   Allergic rhinitis 02/13/2010   Qualifier: Diagnosis of  By: Georgian ROSALEA CHARM Lamar    Angina at rest [facility]) 10/22/2015   Arthritis    Ataxia 08/10/2013   Atrial fibrillation (HCC)  11/2018   B12 deficiency 08/11/2013   BACK PAIN, LUMBAR 01/29/2009   Qualifier: Diagnosis of  By: Georgian ROSALEA CHARM Lamar    BENIGN POSITIONAL VERTIGO 11/23/2009   Qualifier: Diagnosis of  By: Georgian ROSALEA CHARM Lamar    Chronic diastolic heart failure (HCC) 04/24/2015   Chronic pain    left sided-Kristeins   Chronically elevated hemidiaphragm    right   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/04/2015    s/p CABG 2004; CFX DES 2011   Elevated troponin    Essential hypertension 12/12/2006   Qualifier: Diagnosis of  By: Wilhemina RMA, Lucy     GERD 12/12/2006   Qualifier: Diagnosis of  By: Wilhemina RMA, Lucy     GOUT 12/12/2006   Qualifier: Diagnosis of  By: Wilhemina RMA, Lucy     Hemorrhoids    Hyperlipidemia LDL goal <70 12/12/2006   Qualifier: Diagnosis of  By: Wilhemina RMA, Lucy      Hypogonadism male 04/10/2011   Hypothyroidism, acquired 02/15/2013   Insomnia 05/21/2013   Long term current use of anticoagulant therapy 06/24/2011   Myocardial infarction (HCC)    Obstructive sleep apnea-Failed CPAP 06/17/2007   Failed CPAP Using O 2 for sleep    On home oxygen  therapy    2L at night for sleep apnea   Paroxysmal atrial fibrillation (HCC) 12/06/2016   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; resultant hemiplegia of dominant side   Thoracic aorta atherosclerosis (HCC) 04/27/2017   Tubular adenoma of colon 05/2010   Type 2 diabetes mellitus without complication, without long-term current use of insulin  (HCC) 10/05/2007   Qualifier: Diagnosis of  By: Georgian ROSALEA CHARM Lamar    Vasovagal syncope    Vasovagal syncope    Wears glasses     Past Surgical History:  Procedure Laterality Date   CARDIAC CATHETERIZATION N/A 10/25/2015   Procedure: Left Heart Cath and Cors/Grafts Angiography;  Surgeon: Debby DELENA Sor, MD;  Location: [facility] INVASIVE CV LAB;  Service: Cardiovascular;  Laterality: N/A;   CARDIOVERSION  06/20/2011   Procedure: CARDIOVERSION;  Surgeon: Ezra GORMAN Shuck, MD;  Location: [facility] ENDOSCOPY;  Service: Cardiovascular;  Laterality: N/A;   CARDIOVERSION N/A 04/26/2015   Procedure: CARDIOVERSION;  Surgeon: Leim VEAR Moose, MD;  Location: [facility] ENDOSCOPY;  Service: Cardiovascular;  Laterality: N/A;   CARDIOVERSION N/A 05/10/2015   Procedure: CARDIOVERSION;  Surgeon: Ezra GORMAN Shuck, MD;  Location: [facility] ENDOSCOPY;  Service: Cardiovascular;  Laterality: N/A;   CARDIOVERSION N/A 12/10/2018    Procedure: CARDIOVERSION;  Surgeon: Francyne Headland, MD;  Location: [facility] ENDOSCOPY;  Service: Cardiovascular;  Laterality: N/A;   CARPOMETACARPAL (CMC) FUSION OF THUMB Right 07/18/2022   Procedure: right carpo metacarpal suspensionplasty with abductor tendon transfer right wrist stenosing tenosynovitis release, right index carpo metacarpal boss excision;  Surgeon: Sissy Cough, MD;  Location: [facility] OR;  Service: Orthopedics;  Laterality: Right;   CATARACT EXTRACTION  05/2010   left eye   CATARACT EXTRACTION  04/2010   right eye   COLONOSCOPY WITH PROPOFOL  N/A 02/22/2020   Procedure: COLONOSCOPY WITH PROPOFOL ;  Surgeon: San Sandor GAILS, DO;  Location: [facility];  Service: Gastroenterology;  Laterality: N/A;   CORONARY ARTERY BYPASS GRAFT     stent   DORSAL COMPARTMENT RELEASE Right 07/18/2022   Procedure: RELEASE DORSAL COMPARTMENT (DEQUERVAIN);  Surgeon: Sissy Cough, MD;  Location: [facility] OR;  Service: Orthopedics;  Laterality: Right;  Anethesia Axillary block/ MAC   ESOPHAGOGASTRODUODENOSCOPY  03-25-2005  HEMORRHOID SURGERY N/A 09/05/2021   Procedure: HEMORRHOIDECTOMY WITH LIGATION AND HEMORRHOIDOPEXY;  Surgeon: Sheldon Standing, MD;  Location[facility];  Service: General;  Laterality: N/A;   LEFT HEART CATH AND CORS/GRAFTS ANGIOGRAPHY N/A 02/19/2018   Procedure: LEFT HEART CATH AND CORS/GRAFTS ANGIOGRAPHY;  Surgeon: Swaziland, Peter M, MD;  Location: [facility] INVASIVE CV LAB;  Service: Cardiovascular;  Laterality: N/A;   NASAL SEPTUM SURGERY     PERCUTANEOUS PLACEMENT INTRAVASCULAR STENT CERVICAL CAROTID ARTERY     03-2009; using a drug-eluting platform of the circumflex cornoray artery with a 3.0 x 18 [employer] Promus drug-eluting platform post dilated to 3.75 with a noncompliant balloon.   POLYPECTOMY  02/22/2020   Procedure: POLYPECTOMY;  Surgeon: San Sandor GAILS, DO;  [facility];  Service: Gastroenterology;;   RECTAL EXAM UNDER ANESTHESIA N/A 09/05/2021   Procedure: ANORECTAL EXAM  UNDER ANESTHESIA;  Surgeon: Sheldon Standing, MD;  Location[facility];  Service: General;  Laterality: N/A;   TEE WITHOUT CARDIOVERSION  06/20/2011   Procedure: TRANSESOPHAGEAL ECHOCARDIOGRAM (TEE);  Surgeon: Ezra GORMAN Shuck, MD;  Location: [facility] ENDOSCOPY;  Service: Cardiovascular;  Laterality: N/A;    MEDICATIONS: No current facility-administered medications for this encounter.    allopurinol  (ZYLOPRIM ) 100 MG tablet   atorvastatin  (LIPITOR ) 40 MG tablet   benazepril  (LOTENSIN ) 10 MG tablet   benazepril  (LOTENSIN ) 5 MG tablet   diltiazem  (CARDIZEM  CD) 180 MG 24 hr capsule   dofetilide  (TIKOSYN ) 500 MCG capsule   ELIQUIS  5 MG TABS tablet   Ferrous Sulfate  (IRON) 325 (65 Fe) MG TABS   furosemide  (LASIX ) 40 MG tablet   gabapentin  (NEURONTIN ) 100 MG capsule   levothyroxine  (SYNTHROID ) 112 MCG tablet   pantoprazole  (PROTONIX ) 40 MG tablet   traMADol  (ULTRAM ) 50 MG tablet   traMADol  (ULTRAM -ER) 200 MG 24 hr tablet   dicyclomine  (BENTYL ) 10 MG capsule   fluocinonide  gel (LIDEX ) 0.05 %   folic acid  (FOLVITE ) 1 MG tablet   sucralfate  (CARAFATE ) 1 GM/10ML suspension     [facility], PA-C [facility] Pre-Surgical Testing [phone]

## 2023-09-08 ENCOUNTER — Ambulatory Visit (HOSPITAL_COMMUNITY): Payer: Self-pay | Admitting: Physician Assistant

## 2023-09-08 ENCOUNTER — Other Ambulatory Visit: Payer: Self-pay

## 2023-09-08 ENCOUNTER — Encounter (HOSPITAL_COMMUNITY): Payer: Self-pay | Admitting: General Surgery

## 2023-09-08 ENCOUNTER — Encounter (HOSPITAL_COMMUNITY)
Admission: RE | Disposition: A | Discharge status: Home / Self Care | Payer: Self-pay | Source: Home / Self Care | Attending: General Surgery

## 2023-09-08 ENCOUNTER — Ambulatory Visit (HOSPITAL_COMMUNITY)
Admission: RE | Admit: 2023-09-08 | Discharge: 2023-09-08 | Disposition: A | Discharge status: Home / Self Care | Attending: General Surgery | Admitting: General Surgery

## 2023-09-08 DIAGNOSIS — I5032 Chronic diastolic (congestive) heart failure: Secondary | ICD-10-CM | POA: Diagnosis not present

## 2023-09-08 DIAGNOSIS — E119 Type 2 diabetes mellitus without complications: Secondary | ICD-10-CM | POA: Insufficient documentation

## 2023-09-08 DIAGNOSIS — Z951 Presence of aortocoronary bypass graft: Secondary | ICD-10-CM | POA: Diagnosis not present

## 2023-09-08 DIAGNOSIS — Z01818 Encounter for other preprocedural examination: Secondary | ICD-10-CM

## 2023-09-08 DIAGNOSIS — K219 Gastro-esophageal reflux disease without esophagitis: Secondary | ICD-10-CM | POA: Insufficient documentation

## 2023-09-08 DIAGNOSIS — M1990 Unspecified osteoarthritis, unspecified site: Secondary | ICD-10-CM | POA: Diagnosis not present

## 2023-09-08 DIAGNOSIS — Z8673 Personal history of transient ischemic attack (TIA), and cerebral infarction without residual deficits: Secondary | ICD-10-CM | POA: Insufficient documentation

## 2023-09-08 DIAGNOSIS — L989 Disorder of the skin and subcutaneous tissue, unspecified: Secondary | ICD-10-CM

## 2023-09-08 DIAGNOSIS — I25118 Atherosclerotic heart disease of native coronary artery with other forms of angina pectoris: Secondary | ICD-10-CM

## 2023-09-08 DIAGNOSIS — Z79899 Other long term (current) drug therapy: Secondary | ICD-10-CM | POA: Diagnosis not present

## 2023-09-08 DIAGNOSIS — L723 Sebaceous cyst: Secondary | ICD-10-CM | POA: Diagnosis not present

## 2023-09-08 DIAGNOSIS — I2510 Atherosclerotic heart disease of native coronary artery without angina pectoris: Secondary | ICD-10-CM | POA: Insufficient documentation

## 2023-09-08 DIAGNOSIS — E039 Hypothyroidism, unspecified: Secondary | ICD-10-CM | POA: Diagnosis not present

## 2023-09-08 DIAGNOSIS — I110 Hypertensive heart disease with heart failure: Secondary | ICD-10-CM | POA: Diagnosis not present

## 2023-09-08 DIAGNOSIS — G4733 Obstructive sleep apnea (adult) (pediatric): Secondary | ICD-10-CM | POA: Diagnosis not present

## 2023-09-08 DIAGNOSIS — C44519 Basal cell carcinoma of skin of other part of trunk: Secondary | ICD-10-CM | POA: Insufficient documentation

## 2023-09-08 DIAGNOSIS — I480 Paroxysmal atrial fibrillation: Secondary | ICD-10-CM | POA: Insufficient documentation

## 2023-09-08 DIAGNOSIS — I252 Old myocardial infarction: Secondary | ICD-10-CM | POA: Diagnosis not present

## 2023-09-08 DIAGNOSIS — Z9981 Dependence on supplemental oxygen: Secondary | ICD-10-CM | POA: Insufficient documentation

## 2023-09-08 DIAGNOSIS — I509 Heart failure, unspecified: Secondary | ICD-10-CM | POA: Diagnosis not present

## 2023-09-08 HISTORY — PX: EXCISION OF BACK LESION: SHX6597

## 2023-09-08 HISTORY — DX: Dependence on supplemental oxygen: Z99.81

## 2023-09-08 LAB — CBC
HCT: 39 % (ref 39.0–52.0)
Hemoglobin: 13 g/dL (ref 13.0–17.0)
MCH: 31.6 pg (ref 26.0–34.0)
MCHC: 33.3 g/dL (ref 30.0–36.0)
MCV: 94.7 fL (ref 80.0–100.0)
Platelets: 125 10*3/uL — ABNORMAL LOW (ref 150–400)
RBC: 4.12 MIL/uL — ABNORMAL LOW (ref 4.22–5.81)
RDW: 12.8 % (ref 11.5–15.5)
WBC: 5.5 10*3/uL (ref 4.0–10.5)
nRBC: 0 % (ref 0.0–0.2)

## 2023-09-08 LAB — BASIC METABOLIC PANEL WITH GFR
Anion gap: 9 (ref 5–15)
BUN: 17 mg/dL (ref 8–23)
CO2: 28 mmol/L (ref 22–32)
Calcium: 9 mg/dL (ref 8.9–10.3)
Chloride: 106 mmol/L (ref 98–111)
Creatinine, Ser: 0.72 mg/dL (ref 0.61–1.24)
GFR, Estimated: >60 mL/min (ref >60)
Glucose, Bld: 98 mg/dL (ref 70–99)
Potassium: 4 mmol/L (ref 3.5–5.1)
Sodium: 143 mmol/L (ref 135–145)

## 2023-09-08 LAB — HEMOGLOBIN A1C
Hgb A1c MFr Bld: 5.8 % — ABNORMAL HIGH (ref 4.8–5.6)
Mean Plasma Glucose: 119.76 mg/dL

## 2023-09-08 LAB — GLUCOSE, CAPILLARY: Glucose-Capillary: 94 mg/dL (ref 70–99)

## 2023-09-08 SURGERY — EXCISION, LESION, BACK
Anesthesia: General

## 2023-09-08 MED ORDER — OXYCODONE HCL 5 MG PO TABS: 5.0000 mg | ORAL_TABLET | Freq: Three times a day (TID) | ORAL | 0 refills | Status: AC | PRN

## 2023-09-08 MED ORDER — PHENYLEPHRINE HCL-NACL 20-0.9 MG/250ML-% IV SOLN
INTRAVENOUS | Status: AC
  Filled 2023-09-08: qty 250

## 2023-09-08 MED ORDER — MEPERIDINE HCL 50 MG/ML IJ SOLN: 6.2500 mg | INTRAMUSCULAR | Status: DC | PRN

## 2023-09-08 MED ORDER — CEFAZOLIN SODIUM-DEXTROSE 2-4 GM/100ML-% IV SOLN
2.0000 g | INTRAVENOUS | Status: AC
  Administered 2023-09-08: 2 g via INTRAVENOUS
  Filled 2023-09-08: qty 100

## 2023-09-08 MED ORDER — ACETAMINOPHEN 325 MG PO TABS
650.0000 mg | ORAL_TABLET | Freq: Four times a day (QID) | ORAL | 0 refills | Status: AC
Start: 2023-09-08 — End: 2023-09-14

## 2023-09-08 MED ORDER — ONDANSETRON HCL 4 MG/2ML IJ SOLN
INTRAMUSCULAR | Status: AC
  Filled 2023-09-08: qty 2

## 2023-09-08 MED ORDER — OXYCODONE HCL 5 MG PO TABS: 5.0000 mg | ORAL_TABLET | ORAL | Status: DC | PRN

## 2023-09-08 MED ORDER — OXYCODONE HCL 5 MG/5ML PO SOLN: 5.0000 mg | Freq: Once | ORAL | Status: DC | PRN

## 2023-09-08 MED ORDER — IBUPROFEN 200 MG PO TABS: 600.0000 mg | ORAL_TABLET | Freq: Four times a day (QID) | ORAL | 0 refills | Status: AC

## 2023-09-08 MED ORDER — CHLORHEXIDINE GLUCONATE CLOTH 2 % EX PADS: 6.0000 | MEDICATED_PAD | Freq: Once | CUTANEOUS | Status: DC

## 2023-09-08 MED ORDER — FENTANYL CITRATE PF 50 MCG/ML IJ SOSY: 25.0000 ug | PREFILLED_SYRINGE | INTRAMUSCULAR | Status: DC | PRN

## 2023-09-08 MED ORDER — ONDANSETRON HCL 4 MG/2ML IJ SOLN: 4.0000 mg | Freq: Once | INTRAMUSCULAR | Status: DC | PRN

## 2023-09-08 MED ORDER — LIDOCAINE HCL (PF) 1 % IJ SOLN
INTRAMUSCULAR | Status: AC
Start: 2023-09-08 — End: 2023-09-08
  Filled 2023-09-08: qty 30

## 2023-09-08 MED ORDER — LIDOCAINE HCL 1 % IJ SOLN
INTRAMUSCULAR | Status: DC | PRN
  Administered 2023-09-08: 27 mL

## 2023-09-08 MED ORDER — LIDOCAINE HCL (PF) 2 % IJ SOLN
INTRAMUSCULAR | Status: AC
  Filled 2023-09-08: qty 5

## 2023-09-08 MED ORDER — PROPOFOL 10 MG/ML IV BOLUS
INTRAVENOUS | Status: DC | PRN
  Administered 2023-09-08: 20 mg via INTRAVENOUS

## 2023-09-08 MED ORDER — ACETAMINOPHEN 650 MG RE SUPP
650.0000 mg | RECTAL | Status: DC | PRN
Start: 2023-09-08 — End: 2023-09-08

## 2023-09-08 MED ORDER — ORAL CARE MOUTH RINSE: 15.0000 mL | Freq: Once | OROMUCOSAL | Status: AC

## 2023-09-08 MED ORDER — PROPOFOL 500 MG/50ML IV EMUL
INTRAVENOUS | Status: DC | PRN
  Administered 2023-09-08: 100 ug/kg/min via INTRAVENOUS

## 2023-09-08 MED ORDER — ONDANSETRON HCL 4 MG/2ML IJ SOLN
INTRAMUSCULAR | Status: DC | PRN
Start: 2023-09-08 — End: 2023-09-08
  Administered 2023-09-08: 4 mg via INTRAVENOUS

## 2023-09-08 MED ORDER — 0.9 % SODIUM CHLORIDE (POUR BTL) OPTIME
TOPICAL | Status: DC | PRN
  Administered 2023-09-08: 1000 mL

## 2023-09-08 MED ORDER — SODIUM CHLORIDE 0.9 % IV SOLN: 250.0000 mL | INTRAVENOUS | Status: DC | PRN

## 2023-09-08 MED ORDER — PROPOFOL 10 MG/ML IV BOLUS
INTRAVENOUS | Status: AC
  Filled 2023-09-08: qty 20

## 2023-09-08 MED ORDER — LIDOCAINE HCL (PF) 2 % IJ SOLN
INTRAMUSCULAR | Status: DC | PRN
  Administered 2023-09-08: 40 mg via INTRADERMAL

## 2023-09-08 MED ORDER — DEXAMETHASONE SODIUM PHOSPHATE 10 MG/ML IJ SOLN
INTRAMUSCULAR | Status: AC
  Filled 2023-09-08: qty 1

## 2023-09-08 MED ORDER — FENTANYL CITRATE (PF) 250 MCG/5ML IJ SOLN
INTRAMUSCULAR | Status: DC | PRN
Start: 2023-09-08 — End: 2023-09-08
  Administered 2023-09-08 (×2): 25 ug via INTRAVENOUS

## 2023-09-08 MED ORDER — OXYCODONE HCL 5 MG PO TABS: 5.0000 mg | ORAL_TABLET | Freq: Once | ORAL | Status: DC | PRN

## 2023-09-08 MED ORDER — ROCURONIUM BROMIDE 10 MG/ML (PF) SYRINGE
PREFILLED_SYRINGE | INTRAVENOUS | Status: AC
  Filled 2023-09-08: qty 10

## 2023-09-08 MED ORDER — BUPIVACAINE-EPINEPHRINE (PF) 0.5% -1:200000 IJ SOLN
INTRAMUSCULAR | Status: AC
  Filled 2023-09-08: qty 30

## 2023-09-08 MED ORDER — LACTATED RINGERS IV SOLN: INTRAVENOUS | Status: DC

## 2023-09-08 MED ORDER — FENTANYL CITRATE (PF) 100 MCG/2ML IJ SOLN
INTRAMUSCULAR | Status: AC
Start: 2023-09-08 — End: 2023-09-08
  Filled 2023-09-08: qty 2

## 2023-09-08 MED ORDER — ACETAMINOPHEN 325 MG PO TABS: 650.0000 mg | ORAL_TABLET | ORAL | Status: DC | PRN

## 2023-09-08 MED ORDER — CHLORHEXIDINE GLUCONATE 0.12 % MT SOLN
15.0000 mL | Freq: Once | OROMUCOSAL | Status: AC
  Administered 2023-09-08: 15 mL via OROMUCOSAL

## 2023-09-08 MED ORDER — MORPHINE SULFATE (PF) 2 MG/ML IV SOLN: 1.0000 mg | INTRAVENOUS | Status: DC | PRN

## 2023-09-08 MED ORDER — SODIUM CHLORIDE 0.9% FLUSH: 3.0000 mL | Freq: Two times a day (BID) | INTRAVENOUS | Status: DC

## 2023-09-08 MED ORDER — SODIUM CHLORIDE 0.9% FLUSH: 3.0000 mL | INTRAVENOUS | Status: DC | PRN

## 2023-09-08 MED ORDER — ACETAMINOPHEN 500 MG PO TABS
1000.0000 mg | ORAL_TABLET | ORAL | Status: AC
  Administered 2023-09-08: 1000 mg via ORAL
  Filled 2023-09-08: qty 2

## 2023-09-08 SURGICAL SUPPLY — 34 items
BAG COUNTER SPONGE SURGICOUNT (BAG) IMPLANT
CHLORAPREP W/TINT 26 (MISCELLANEOUS) ×2 IMPLANT
CNTNR URN SCR LID CUP LEK RST (MISCELLANEOUS) IMPLANT
COVER SURGICAL LIGHT HANDLE (MISCELLANEOUS) ×2 IMPLANT
DERMABOND ADVANCED .7 DNX12 (GAUZE/BANDAGES/DRESSINGS) IMPLANT
DRAPE LAPAROSCOPIC ABDOMINAL (DRAPES) IMPLANT
DRAPE LAPAROTOMY T 102X78X121 (DRAPES) IMPLANT
DRAPE LAPAROTOMY TRNSV 102X78 (DRAPES) IMPLANT
DRAPE SHEET LG 3/4 BI-LAMINATE (DRAPES) IMPLANT
ELECT REM PT RETURN 15FT ADLT (MISCELLANEOUS) ×2 IMPLANT
GAUZE SPONGE 4X4 12PLY STRL (GAUZE/BANDAGES/DRESSINGS) IMPLANT
GLOVE BIOGEL PI IND STRL 7.0 (GLOVE) ×2 IMPLANT
GLOVE SURG ORTHO 8.0 STRL STRW (GLOVE) ×2 IMPLANT
GLOVE SURG SYN 7.5 E (GLOVE) ×1 IMPLANT
GLOVE SURG SYN 7.5 PF PI (GLOVE) ×2 IMPLANT
GOWN STRL REUS W/ TWL XL LVL3 (GOWN DISPOSABLE) ×4 IMPLANT
KIT BASIN OR (CUSTOM PROCEDURE TRAY) ×2 IMPLANT
KIT TURNOVER KIT A (KITS) ×2 IMPLANT
MARKER SKIN DUAL TIP RULER LAB (MISCELLANEOUS) IMPLANT
NDL HYPO 25X1 1.5 SAFETY (NEEDLE) ×2 IMPLANT
NEEDLE HYPO 25X1 1.5 SAFETY (NEEDLE) ×1 IMPLANT
NS IRRIG 1000ML POUR BTL (IV SOLUTION) ×2 IMPLANT
PACK GENERAL/GYN (CUSTOM PROCEDURE TRAY) ×2 IMPLANT
SPIKE FLUID TRANSFER (MISCELLANEOUS) IMPLANT
SPONGE T-LAP 4X18 ~~LOC~~+RFID (SPONGE) IMPLANT
STAPLER SKIN PROX 35W (STAPLE) IMPLANT
STRIP CLOSURE SKIN 1/2X4 (GAUZE/BANDAGES/DRESSINGS) IMPLANT
SUT ETHILON 2 0 PS N (SUTURE) IMPLANT
SUT MNCRL AB 4-0 PS2 18 (SUTURE) IMPLANT
SUT SILK 2 0 SH (SUTURE) IMPLANT
SUT VIC AB 2-0 SH 18 (SUTURE) IMPLANT
SUT VIC AB 3-0 SH 18 (SUTURE) IMPLANT
SYR CONTROL 10ML LL (SYRINGE) ×2 IMPLANT
TOWEL OR 17X26 10 PK STRL BLUE (TOWEL DISPOSABLE) ×2 IMPLANT

## 2023-09-08 NOTE — Discharge Instructions (Addendum)
 Outpatient Surgery Home Care Instruction  Activity  The effects of anesthesia are still present and drowsiness may result.  Limit activity for the first 24 hours, then you may return to normal daily activities. Returning to normal daily activities as soon as you can following surgery will enhance recovery time.  Do not drive or operate heavy machinery within 24 hours of taking narcotic pain medications.   Do not mow the lawn, use a vacuum cleaner, or do any other strenuous activities without first consulting your surgical team.   Diet Drink plenty of fluids and eat light meals today, then resume regular diet. Some patients may find their appetite is poor for a week or two after surgery. This is a normal result of the stress of surgery-your appetite will return in time.   There are no specific diet restrictions after surgery.   Dressing and Wound Care  You have sutures in place that will be removed in the office at your follow up appointment. Keep your wound or incision site clean and dry.  You may have different types of dressings covering your incisions depending on your operation and your surgeon: Dermabond/Durabond (skin glue): This will usually remain in place for 10-14 days, then naturally fall off your skin. You may take a shower 24 hrs after surgery, carefully wash, not scrub the incision site with a mild non-scented soap. Pat dry with a soft towel.  Do not pick or peel skin glue off.  You can shower and let the water fall on the dressings above. Do not soak or submerge your incision(s) in a bath tub, hot tub, or swimming pool, until your doctor says it is ok to do so or the incision(s) have completely healed, usually about 2-4 weeks.  Do not use creams, powder, salves or balms on your incision(s).  What to Expect After Surgery   Moderate discomfort controlled with medications  Minimal drainage from incision  Feeling fatigue and weak  Constipation after surgery is common. Drink plenty  fluids and eat a high fiber diet.   Pain Control: Prescribed Non-Narcotic Pain Medication  You will be given three prescriptions.  Two of them will be for prescription strength ibuprofen (i.e. Advil) and prescription strength acetaminophen  (i.e. Tylenol ).  The vast majority of patients will just need these two medications.  One prescription will be for a 'rescue' prescription of an oral narcotic (oxycodone ).  You may fill this if needed.  You will alternate taking the ibuprofen (600mg ) every 6 hours and also the acetaminophen  (650mg ) every 6 hours so that you are taking one of those medications every 3 hours.  For example: o 0800 - take ibuprofen 600mg  o 1100 - take acetaminophen  650mg  o 1400 - take ibuprofen 600mg  o 1700 - take acetaminophen  650mg  o Etc.  Continue taking this alternating pattern of ibuprofen and acetaminophen  for 3 days  If you cannot take one or the other of these medications, just take the one you can every 6 hours.  If you are comfortable at night, you don't have to wake up and take a medication.  If you are still uncomfortable after taking either ibuprofen or acetaminophen , try gentle stretching exercise and ice packs (a bag of frozen vegetables works great).  If you are still uncomfortable, you may fill the narcotic prescription of Oxycodone  and take as directed.  Once you have completed these prescriptions, your pain level should be low enough to stop taking medications altogether or just use an over the counter medication (ibuprofen  or acetaminophen ) as needed.    Pain Control: Over the Counter Medications to take as needed  Colace/Docusate: May be prescribed by your surgeon to prevent constipation caused by the combination of narcotics, effects of anesthesia, and decreased ambulation.  Hold for loose stools or diarrhea. Take 100 mg 1-2 times a day starting tonight.   Fiber: High fiber foods, extra liquids (water 9-13 cups/day) can also assist with constipation. Examples  of high fiber foods are fruit, bran. Prune juice and water are also good liquids to drink.  Milk of Magnesia/Miralax :  If constipated despite takeing the over the counter stool softeners, you may take Milk of Magnesia or Miralax  as directed on bottle to assist with constipation.     Pepcid /Famotidine : May be prescribed while taking naproxen (Aleve) or other NSAIDs such as ibuprofen (Motrin/Advil) to prevent stomach upset or Acid-reflux symptoms. Take 1 tablet 1-2 times a day.   **Constipation: The first bowel movement may occur anywhere between 1-5 days after surgery.  As long as you are not nauseated or not having significant abdominal pain this variation is acceptable. Narcotic pain medications can cause constipation increasing discomfort; early discontinuation will assist with bowel management. If constipated despite taking stool softeners, you may take Milk of Magnesia or Miralax  as directed on the bottle.     **Home medications: You may restart your home medications as directed by your respective Primary Care Physician or Surgeon.   When to notify your Doctor or Healthcare Team   Sign of Wound Infection   Fever over 100 degrees.  Wound becomes extremely swollen, shows red streaks, warm to the touch, and/or drainage from the incision site or foul-smelling drainage.  Wound edges separate or opens up  Bleeding or bruising   If you have bleeding, apply pressure to the site and hold the pressure firmly for 5 minutes. If the bleeding continues, apply pressure again and call 911. If the bleeding stopped, call your doctor to report it.   Call your doctor or nurse if you have increased bleeding from your site and increased bruising or a lump forms or gets larger under your skin at the site. Unrelieved Pain   Call your doctor or nurse if your pain gets worse or is not eased 1 hour after taking your pain medicine, or if it is severe and uncontrolled. Nausea and Vomiting   Call your doctor or nurse if  you have nausea and vomiting that continues more than 24 hours, will not let you keep medicine down and will not let you keep fluids down  Fever, Flu-like symptoms   Fever over 100 degrees and/or chills  Gastrointestinal Bleeding Symptoms    Black tarry bowel movements.  This can be normal after surgery on the stomach, but should resolve in a day or two.    Call 911 if you suddenly have signs of blood loss such as:  Vomiting blood  Fast heart rate  Feeling faint, sweaty, or blacking out  Passing bright red blood from your rectum  Blood Clot Symptoms   Tender, swollen or reddened areas in your calf muscle or thighs.  Numbness or tingling in your lower leg or calf, or at the top of your leg or groin  Skin on your leg looks pale or blue or feels cold to touch  Chest pain or have trouble breathing, lightheadedness, fast heart rate  Sudden Onset of Symptoms    Call 911 if you suddenly have:  Leg weakness and spasm  Loss of bladder  or bowel function  Seizure  Confusion, severe headache, dizziness or feeling unsteady, problems talking, difficulty swallowing, and/or numbness or muscle weakness as these could be signs of a stroke.  Follow up Appointment Your follow up appointment should be scheduled 2-3 weeks after your surgery date.  If you have not previously scheduled for a follow-up visit you can be scheduled by contacting [phone].

## 2023-09-08 NOTE — Transfer of Care (Signed)
 Immediate Anesthesia Transfer of Care Note  Patient: [name]  Procedure(s) Performed: EXCISION, LESION, BACK  Patient Location: PACU  Anesthesia Type:MAC  Level of Consciousness: awake and patient cooperative  Airway & Oxygen  Therapy: Patient Spontanous Breathing and Patient connected to face mask oxygen   Post-op Assessment: Report given to RN and Post -op Vital signs reviewed and stable  Post vital signs: Reviewed and stable  Last Vitals:  Vitals Value Taken Time  BP 121/61 09/08/23 14:51  Temp    Pulse 60 09/08/23 14:54  Resp 10 09/08/23 14:54  SpO2 100 % 09/08/23 14:54  Vitals shown include unfiled device data.  Last Pain:  Vitals:   09/08/23 1226  TempSrc:   PainSc: 0-No pain         Complications: No notable events documented.

## 2023-09-08 NOTE — Op Note (Signed)
 09/08/2023  2:49 PM  PATIENT:  [name] Reason  78 y.o. male  Patient Care Team: Daryl Setter, NP as PCP - General (Internal Medicine) Jeffrie Oneil BROCKS, MD as PCP - Cardiology (Cardiology) Fernande Elspeth BROCKS, MD as PCP - Electrophysiology (Cardiology) Carilyn, Prentice BRAVO, MD as Consulting Physician (Physical Medicine and Rehabilitation) [facility], Eduard SAUNDERS, MD as Consulting Physician (Neurology) Rolan Ezra RAMAN, MD as Consulting Physician (Cardiology) San Sandor GAILS, DO as Consulting Physician (Gastroenterology) Sheldon Elspeth, MD as Consulting Physician (General Surgery) Elner Arley LABOR, MD as Consulting Physician (Ophthalmology)  PRE-OPERATIVE DIAGNOSIS:  Back lesion  POST-OPERATIVE DIAGNOSIS:  Same  PROCEDURE:  Excision of back cutaneous skin lesion (2.5cm x 7cm)  SURGEON:  Cordella RONAL Idler, MD  ASSISTANT: None  ANESTHESIA:   MAC  COUNTS:  Sponge, needle and instrument counts were reported correct x2 at the conclusion of the operation.  EBL: Minimal  DRAINS: Drain  SPECIMEN:  Back lesion, single stitch marks medial, double stick marks inferior  COMPLICATIONS: None  FINDINGS: Chronic wound identified and excised  DISPOSITION: PACU in satisfactory condition  INDICATION: [name] is a 78 year old male with a wound on his back that has been present for several years and has intermittently become infection. He desired excision for diagnosis and to reduce the chance of recurrent infection. All of his questions were addressed and written consent was obtained  DESCRIPTION: The patient was identified in preop holding and taken to the OR where he was placed on the operating room table. SCDs were placed. Monitor anesthesia was induced without difficulty. His back was then prepped and draped in the usual sterile fashion. A surgical timeout was performed indicating the correct patient, procedure, positioning and need for preoperative antibiotics.  The previously marked area  near the mid-back was identified. A incision was made overlying the less using a #15 blade and carried down through the subcutaneous tissue using electrocautery. The lesion appeared to be isolated to the cutaneous tissues and no subcutaneous component was identified. The dermis was undermined and the entirety of the lesion excised. The area of excision measured 2.5cm x 7cm. The wound was irrigated with sterile saline and inspected for hemostasis. The deep layers were closed with interrupted 2-0 vicryl. Another layer of 2-0 vicryl were used to approximate the dermis followed by a running 4-0 subcuticular monocryl. I placed a final layer of interrupted 2-0 nylon to take tension off of the wound.  A layer of dermabond was applied.

## 2023-09-08 NOTE — H&P (Signed)
 [name] 09/09/1945  [id number].    HPI:  78 y/o M with a chronic lesion on his upper back that has been infected multiple times. I offered him excision for both therapeutic and diagnostic purposes. He arrives today for elective excision. He is in his usual state of health and denies any recent changes in medication.   ROS: Review of Systems  Constitutional: Negative.   HENT: Negative.    Eyes: Negative.   Respiratory: Negative.    Cardiovascular: Negative.   Gastrointestinal: Negative.   Genitourinary: Negative.   Musculoskeletal: Negative.   Skin:        Back lesion  Neurological: Negative.   Endo/Heme/Allergies: Negative.   Psychiatric/Behavioral: Negative.      Family History  Problem Relation Age of Onset   Stroke Mother        deceased-MINISTROKES   Lung cancer Father        deceased   Heart disease Father    Colon cancer Neg Hx    Esophageal cancer Neg Hx    Liver disease Neg Hx     Past Medical History:  Diagnosis Date   Acute encephalopathy 12/12/2015   Acute respiratory failure with hypoxia and hypercapnia (HCC) 12/12/2015   Allergic rhinitis 02/13/2010   Qualifier: Diagnosis of  By: Georgian ROSALEA CHARM Lamar    Angina at rest [facility] 10/22/2015   Arthritis    Ataxia 08/10/2013   Atrial fibrillation (HCC) 11/2018   B12 deficiency 08/11/2013   BACK PAIN, LUMBAR 01/29/2009   Qualifier: Diagnosis of  By: Georgian ROSALEA CHARM Lamar    BENIGN POSITIONAL VERTIGO 11/23/2009   Qualifier: Diagnosis of  By: Georgian ROSALEA CHARM Lamar    Chronic diastolic heart failure (HCC) 04/24/2015   Chronic pain    left sided-Kristeins   Chronically elevated hemidiaphragm    right   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/04/2015   s/p CABG 2004; CFX DES 2011   Elevated troponin    Essential hypertension 12/12/2006   Qualifier: Diagnosis of  By: Wilhemina RMA, Lucy     GERD 12/12/2006   Qualifier: Diagnosis of  By: Wilhemina RMA, Lucy     GOUT  12/12/2006   Qualifier: Diagnosis of  By: Wilhemina RMA, Lucy     Hemorrhoids    Hyperlipidemia LDL goal <70 12/12/2006   Qualifier: Diagnosis of  By: Wilhemina RMA, Lucy      Hypogonadism male 04/10/2011   Hypothyroidism, acquired 02/15/2013   Insomnia 05/21/2013   Long term current use of anticoagulant therapy 06/24/2011   Myocardial infarction (HCC)    Obstructive sleep apnea-Failed CPAP 06/17/2007   Failed CPAP Using O 2 for sleep    On home oxygen  therapy    2L at night for sleep apnea   Paroxysmal atrial fibrillation (HCC) 12/06/2016   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; resultant hemiplegia of dominant side   Thoracic aorta atherosclerosis (HCC) 04/27/2017   Tubular adenoma of colon 05/2010   Type 2 diabetes mellitus without complication, without long-term current use of insulin  (HCC) 10/05/2007   Qualifier: Diagnosis of  By: Georgian ROSALEA CHARM Lamar    Vasovagal syncope    Vasovagal syncope    Wears glasses     Past Surgical History:  Procedure Laterality Date   CARDIAC CATHETERIZATION N/A 10/25/2015   Procedure: Left Heart Cath and Cors/Grafts Angiography;  Surgeon: Debby DELENA Sor, MD;  Location: [facility] INVASIVE CV LAB;  Service:  Cardiovascular;  Laterality: N/A;   CARDIOVERSION  06/20/2011   Procedure: CARDIOVERSION;  Surgeon: Ezra GORMAN Shuck, MD;  Location: [facility] ENDOSCOPY;  Service: Cardiovascular;  Laterality: N/A;   CARDIOVERSION N/A 04/26/2015   Procedure: CARDIOVERSION;  Surgeon: Leim VEAR Moose, MD;  Location: [facility] ENDOSCOPY;  Service: Cardiovascular;  Laterality: N/A;   CARDIOVERSION N/A 05/10/2015   Procedure: CARDIOVERSION;  Surgeon: Ezra GORMAN Shuck, MD;  Location: [facility] ENDOSCOPY;  Service: Cardiovascular;  Laterality: N/A;   CARDIOVERSION N/A 12/10/2018   Procedure: CARDIOVERSION;  Surgeon: Francyne Headland, MD;  Location: [facility] ENDOSCOPY;  Service: Cardiovascular;  Laterality: N/A;   CARPOMETACARPAL (CMC) FUSION OF THUMB Right 07/18/2022   Procedure: right carpo metacarpal  suspensionplasty with abductor tendon transfer right wrist stenosing tenosynovitis release, right index carpo metacarpal boss excision;  Surgeon: Sissy Cough, MD;  Location: [facility] OR;  Service: Orthopedics;  Laterality: Right;   CATARACT EXTRACTION  05/2010   left eye   CATARACT EXTRACTION  04/2010   right eye   COLONOSCOPY WITH PROPOFOL  N/A 02/22/2020   Procedure: COLONOSCOPY WITH PROPOFOL ;  Surgeon: San Sandor GAILS, DO;  Location: [facility] ENDOSCOPY;  Service: Gastroenterology;  Laterality: N/A;   CORONARY ARTERY BYPASS GRAFT     stent   DORSAL COMPARTMENT RELEASE Right 07/18/2022   Procedure: RELEASE DORSAL COMPARTMENT (DEQUERVAIN);  Surgeon: Sissy Cough, MD;  Location: [facility] OR;  Service: Orthopedics;  Laterality: Right;  Anethesia Axillary block/ MAC   ESOPHAGOGASTRODUODENOSCOPY  03-25-2005   HEMORRHOID SURGERY N/A 09/05/2021   Procedure: HEMORRHOIDECTOMY WITH LIGATION AND HEMORRHOIDOPEXY;  Surgeon: Sheldon Standing, MD;  Location: [facility] ORS;  Service: General;  Laterality: N/A;   LEFT HEART CATH AND CORS/GRAFTS ANGIOGRAPHY N/A 02/19/2018   Procedure: LEFT HEART CATH AND CORS/GRAFTS ANGIOGRAPHY;  Surgeon: Swaziland, Peter M, MD;  Location: [facility] INVASIVE CV LAB;  Service: Cardiovascular;  Laterality: N/A;   NASAL SEPTUM SURGERY     PERCUTANEOUS PLACEMENT INTRAVASCULAR STENT CERVICAL CAROTID ARTERY     03-2009; using a drug-eluting platform of the circumflex cornoray artery with a 3.0 x 18 [employer] Promus drug-eluting platform post dilated to 3.75 with a noncompliant balloon.   POLYPECTOMY  02/22/2020   Procedure: POLYPECTOMY;  Surgeon: San Sandor GAILS, DO;  Location: [facility] ENDOSCOPY;  Service: Gastroenterology;;   RECTAL EXAM UNDER ANESTHESIA N/A 09/05/2021   Procedure: ANORECTAL EXAM UNDER ANESTHESIA;  Surgeon: Sheldon Standing, MD;  Location: [facility] ORS;  Service: General;  Laterality: N/A;   TEE WITHOUT CARDIOVERSION  06/20/2011   Procedure: TRANSESOPHAGEAL ECHOCARDIOGRAM (TEE);  Surgeon: Ezra GORMAN Shuck,  MD;  Location: [facility] ENDOSCOPY;  Service: Cardiovascular;  Laterality: N/A;    Social History:  reports that he has never smoked. He has never used smokeless tobacco. He reports that he does not currently use alcohol. He reports that he does not use drugs.  Allergies: No Known Allergies  Medications Prior to Admission  Medication Sig Dispense Refill   allopurinol  (ZYLOPRIM ) 100 MG tablet TAKE 2 TABLETS BY MOUTH EVERY DAY 180 tablet 0   atorvastatin  (LIPITOR ) 40 MG tablet TAKE 1 TABLET BY MOUTH EVERY DAY 90 tablet 0   benazepril  (LOTENSIN ) 10 MG tablet Take 1 tablet (10 mg total) by mouth daily. (Patient taking differently: Take 10 mg by mouth See admin instructions. Take with 5 mg for a total of 15 mg in the morning) 90 tablet 1   benazepril  (LOTENSIN ) 5 MG tablet TAKE 1 TABLET (5 MG TOTAL) BY MOUTH DAILY. TAKE WITH BENAZEPRIL  10 MG (Patient taking differently: Take 5 mg by  mouth See admin instructions. Take with 10 mg of a total of 15 mg in the morning) 90 tablet 0   diltiazem  (CARDIZEM  CD) 180 MG 24 hr capsule Take 1 capsule (180 mg total) by mouth daily. 90 capsule 2   dofetilide  (TIKOSYN ) 500 MCG capsule TAKE 1 CAPSULE (500 MCG TOTAL) BY MOUTH 2 (TWO) TIMES DAILY. PLEASE CALL [phone] TO SCHEDULE A MARCH APPOINTMENT WITH DR. KLEIN FOR FUTURE REFILLS. THANK YOU. 180 capsule 2   ELIQUIS  5 MG TABS tablet TAKE 1 TABLET BY MOUTH TWICE A DAY 180 tablet 1   Ferrous Sulfate  (IRON) 325 (65 Fe) MG TABS Take 65 mg by mouth daily.     furosemide  (LASIX ) 40 MG tablet Take 40 mg by mouth daily as needed for fluid or edema.     gabapentin  (NEURONTIN ) 100 MG capsule TAKE 1 CAPSULE BY MOUTH AT BEDTIME. 90 capsule 0   levothyroxine  (SYNTHROID ) 112 MCG tablet TAKE 1 TABLET BY MOUTH EVERY DAY BEFORE BREAKFAST 90 tablet 0   pantoprazole  (PROTONIX ) 40 MG tablet TAKE 1 TABLET BY MOUTH DAILY BEFORE BREAKFAST 90 tablet 0   traMADol  (ULTRAM ) 50 MG tablet Take 1 tablet (50 mg total) by mouth 2 (two) times daily. 180  tablet 1   traMADol  (ULTRAM -ER) 200 MG 24 hr tablet Take 1 tablet (200 mg total) by mouth daily. (Patient taking differently: Take 200 mg by mouth in the morning.) 90 tablet 1   dicyclomine  (BENTYL ) 10 MG capsule TAKE 1 CAPSULE (10 MG TOTAL) BY MOUTH 3 (THREE) TIMES DAILY BEFORE MEALS. (Patient not taking: Reported on 09/04/2023) 90 capsule 0   fluocinonide  gel (LIDEX ) 0.05 % Apply 1 Application topically 2 (two) times daily as needed. (Patient not taking: Reported on 08/20/2023) 60 g 1   folic acid  (FOLVITE ) 1 MG tablet TAKE 1 TABLET BY MOUTH EVERY DAY (Patient not taking: Reported on 08/20/2023) 90 tablet 1   sucralfate  (CARAFATE ) 1 GM/10ML suspension Take 10 mLs (1 g total) by mouth 4 (four) times daily. (Patient not taking: Reported on 08/20/2023) 1200 mL 3    Physical Exam: Blood pressure (!) 150/89, pulse 76, temperature 98.1 F (36.7 C), temperature source Oral, resp. rate 18, height 6' 3 (1.905 m), weight 111.1 kg, SpO2 95%. Gen: male, NAD Back: lesion marked  Results for orders placed or performed during the hospital encounter of 09/08/23 (from the past 48 hours)  CBC per protocol     Status: Abnormal   Collection Time: 09/08/23 12:40 PM  Result Value Ref Range   WBC 5.5 4.0 - 10.5 K/uL   RBC 4.12 (L) 4.22 - 5.81 MIL/uL   Hemoglobin 13.0 13.0 - 17.0 g/dL   HCT 60.9 60.9 - 47.9 %   MCV 94.7 80.0 - 100.0 fL   MCH 31.6 26.0 - 34.0 pg   MCHC 33.3 30.0 - 36.0 g/dL   RDW 87.1 88.4 - 84.4 %   Platelets 125 (L) 150 - 400 K/uL   nRBC 0.0 0.0 - 0.2 %    Comment: Performed at [facility], [address], [place], [place] [address]   *Note: Due to a large number of results and/or encounters for the requested time period, some results have not been displayed. A complete set of results can be found in Results Review.   No results found.  Assessment/Plan 78 y/o M w/ a back lesion of undetermined etiology  - Will proceed to the OR. We discussed the alternatives and  potential risks of surgery, including  but not limited to: bleeding, infection, wound complications, failure to obtain a diagnosis, and need for additional procedures. All questions were addressed and consent was obtained.    Cordella DELENA Polly Marlis Cheron Surgery 09/08/2023, 1:28 PM Please see Amion for pager number during day hours 7:00am-4:30pm or 7:00am -11:30am on weekends

## 2023-09-09 ENCOUNTER — Encounter (HOSPITAL_COMMUNITY): Payer: Self-pay | Admitting: General Surgery

## 2023-09-09 NOTE — Anesthesia Postprocedure Evaluation (Signed)
 Anesthesia Post Note  Patient: MARTRELL EGUIA  Procedure(s) Performed: EXCISION, LESION, BACK     Patient location during evaluation: PACU Anesthesia Type: General Level of consciousness: awake and alert Pain management: pain level controlled Vital Signs Assessment: post-procedure vital signs reviewed and stable Respiratory status: spontaneous breathing, nonlabored ventilation, respiratory function stable and patient connected to nasal cannula oxygen  Cardiovascular status: blood pressure returned to baseline and stable Postop Assessment: no apparent nausea or vomiting Anesthetic complications: no  No notable events documented.  Last Vitals:  Vitals:   09/08/23 1530 09/08/23 1537  BP: 134/66 139/67  Pulse: (!) 56 (!) 54  Resp: 13   Temp: (!) 36.3 C   SpO2: 97% 100%    Last Pain:  Vitals:   09/08/23 1537  TempSrc:   PainSc: 0-No pain                 Garnette DELENA Gab

## 2023-09-10 LAB — SURGICAL PATHOLOGY

## 2023-09-12 ENCOUNTER — Other Ambulatory Visit: Payer: Self-pay | Admitting: Family

## 2023-09-13 ENCOUNTER — Other Ambulatory Visit: Payer: Self-pay | Admitting: Family

## 2023-09-13 DIAGNOSIS — Z8639 Personal history of other endocrine, nutritional and metabolic disease: Secondary | ICD-10-CM

## 2023-09-17 ENCOUNTER — Ambulatory Visit: Admitting: Gastroenterology

## 2023-09-17 NOTE — Progress Notes (Deleted)
 [facility] Follow up note    Referring Provider Daryl Setter, NP [address] [place],  [place] [address]   Primary Care Provider Daryl Setter, NP   Patient Profile: [name] is a 78 y.o. male who is seen in consultation in the [facility] at the request of Dr. Daryl for evaluation and management of the problem(s) noted below.   Problem List: Periumbilical abdominal pain GERD Constipation History of adenomatous colon polyp History of hemorrhoids status post hemorrhoidectomy Hepatic steatosis on abdominal ultrasound 2025   History of Present Illness   [name] is a 78 y.o. male with a history of CAD status post CAB G 2004 and DES 2011, A-fib on Eliquis , CHF, OSA, T2DM, hypothyroidism, hypogonadism, CVA with spastic hemiplegia and gout who is referred to the gastroenterology office for evaluation and management of periumbilical abdominal pain.  Last seen in the GI office by Dr. Suzann on 06/17/23.  Interval History  Patient admits/denies GERD Patient admits/denies dysphagia Patient admits/denies nausea, vomiting, or weight loss  Patient admits/denies altered bowel habits Patient admits/denies abdominal pain Patient admits/denies rectal bleeding      Workup performed by PCP includes: -- CBC, LFTs, lipase, BMP-normal 04/2023 -- CTAP 12/2022-small fat-containing right inguinal hernia -- AUS 04/2023-gallbladder is not visualized, increased hepatic parenchymal echogenicity suggestive of steatosis  Last colonoscopy: 02/2020 -3 mm colon polyp in AC, small nonbleeding rectal varices, nonbleeding internal hemorrhoids Last endoscopy: 2007   Last Abd CT/CTE/MRE: 12/2022 - small fat-containing right inguinal hernia    Wt Readings from Last 3 Encounters:  09/08/23 245 lb (111.1 kg)  09/01/23 246 lb (111.6 kg)  08/12/23 242 lb (109.8 kg)      Past Medical History:  Diagnosis Date   Acute encephalopathy  12/12/2015   Acute respiratory failure with hypoxia and hypercapnia (HCC) 12/12/2015   Allergic rhinitis 02/13/2010   Qualifier: Diagnosis of  By: Georgian ROSALEA CHARM Lamar    Angina at rest [facility]) 10/22/2015   Arthritis    Ataxia 08/10/2013   Atrial fibrillation (HCC) 11/2018   B12 deficiency 08/11/2013   BACK PAIN, LUMBAR 01/29/2009   Qualifier: Diagnosis of  By: Georgian ROSALEA CHARM Lamar    BENIGN POSITIONAL VERTIGO 11/23/2009   Qualifier: Diagnosis of  By: Georgian ROSALEA CHARM Lamar    Chronic diastolic heart failure (HCC) 04/24/2015   Chronic pain    left sided-Kristeins   Chronically elevated hemidiaphragm    right   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/04/2015   s/p CABG 2004; CFX DES 2011   Elevated troponin    Essential hypertension 12/12/2006   Qualifier: Diagnosis of  By: Wilhemina RMA, Lucy     GERD 12/12/2006   Qualifier: Diagnosis of  By: Wilhemina KRAFT, Lucy     GOUT 12/12/2006   Qualifier: Diagnosis of  By: Wilhemina RMA, Lucy     Hemorrhoids    Hyperlipidemia LDL goal <70 12/12/2006   Qualifier: Diagnosis of  By: Wilhemina RMA, Lucy      Hypogonadism male 04/10/2011   Hypothyroidism, acquired 02/15/2013   Insomnia 05/21/2013   Long term current use of anticoagulant therapy 06/24/2011   Myocardial infarction [facility])    Obstructive sleep apnea-Failed CPAP 06/17/2007   Failed CPAP Using O 2 for sleep    On home oxygen  therapy    2L at night for sleep apnea   Paroxysmal atrial fibrillation (HCC) 12/06/2016   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  also 1996; resultant hemiplegia of dominant side   Thoracic aorta atherosclerosis (HCC) 04/27/2017   Tubular adenoma of colon 05/2010   Type 2 diabetes mellitus without complication, without long-term current use of insulin  (HCC) 10/05/2007   Qualifier: Diagnosis of  By: Georgian ROSALEA CHARM Lamar    Vasovagal syncope    Vasovagal syncope    Wears glasses     Past Surgical History:  Procedure Laterality Date   CARDIAC  CATHETERIZATION N/A 10/25/2015   Procedure: Left Heart Cath and Cors/Grafts Angiography;  Surgeon: Debby DELENA Sor, MD;  Location: [facility] INVASIVE CV LAB;  Service: Cardiovascular;  Laterality: N/A;   CARDIOVERSION  06/20/2011   Procedure: CARDIOVERSION;  Surgeon: Ezra GORMAN Shuck, MD;  Location: [facility] ENDOSCOPY;  Service: Cardiovascular;  Laterality: N/A;   CARDIOVERSION N/A 04/26/2015   Procedure: CARDIOVERSION;  Surgeon: Leim VEAR Moose, MD;  Location: [facility] ENDOSCOPY;  Service: Cardiovascular;  Laterality: N/A;   CARDIOVERSION N/A 05/10/2015   Procedure: CARDIOVERSION;  Surgeon: Ezra GORMAN Shuck, MD;  Location: [facility] ENDOSCOPY;  Service: Cardiovascular;  Laterality: N/A;   CARDIOVERSION N/A 12/10/2018   Procedure: CARDIOVERSION;  Surgeon: Francyne Headland, MD;  Location: [facility] ENDOSCOPY;  Service: Cardiovascular;  Laterality: N/A;   CARPOMETACARPAL (CMC) FUSION OF THUMB Right 07/18/2022   Procedure: right carpo metacarpal suspensionplasty with abductor tendon transfer right wrist stenosing tenosynovitis release, right index carpo metacarpal boss excision;  Surgeon: Sissy Cough, MD;  Location: [facility] OR;  Service: Orthopedics;  Laterality: Right;   CATARACT EXTRACTION  05/2010   left eye   CATARACT EXTRACTION  04/2010   right eye   COLONOSCOPY WITH PROPOFOL  N/A 02/22/2020   Procedure: COLONOSCOPY WITH PROPOFOL ;  Surgeon: San Sandor GAILS, DO;  Location: [facility] ENDOSCOPY;  Service: Gastroenterology;  Laterality: N/A;   CORONARY ARTERY BYPASS GRAFT     stent   DORSAL COMPARTMENT RELEASE Right 07/18/2022   Procedure: RELEASE DORSAL COMPARTMENT (DEQUERVAIN);  Surgeon: Sissy Cough, MD;  Location[facility] OR;  Service: Orthopedics;  Laterality: Right;  Anethesia Axillary block/ MAC   ESOPHAGOGASTRODUODENOSCOPY  03-25-2005   EXCISION OF BACK LESION N/A 09/08/2023   Procedure: EXCISION, LESION, BACK;  Surgeon: Polly Cordella DELENA, MD;  Location: [facility] ORS;  Service: General;  Laterality: N/A;  EXCISION OF LESION UPPER MID BACK    HEMORRHOID SURGERY N/A 09/05/2021   Procedure: HEMORRHOIDECTOMY WITH LIGATION AND HEMORRHOIDOPEXY;  Surgeon: Sheldon Standing, MD;  Location: [facility] ORS;  Service: General;  Laterality: N/A;   LEFT HEART CATH AND CORS/GRAFTS ANGIOGRAPHY N/A 02/19/2018   Procedure: LEFT HEART CATH AND CORS/GRAFTS ANGIOGRAPHY;  Surgeon: Swaziland, Peter M, MD;  Location: [facility] INVASIVE CV LAB;  Service: Cardiovascular;  Laterality: N/A;   NASAL SEPTUM SURGERY     PERCUTANEOUS PLACEMENT INTRAVASCULAR STENT CERVICAL CAROTID ARTERY     03-2009; using a drug-eluting platform of the circumflex cornoray artery with a 3.0 x 18 [employer] Promus drug-eluting platform post dilated to 3.75 with a noncompliant balloon.   POLYPECTOMY  02/22/2020   Procedure: POLYPECTOMY;  Surgeon: San Sandor GAILS, DO;  Location: [facility] ENDOSCOPY;  Service: Gastroenterology;;   RECTAL EXAM UNDER ANESTHESIA N/A 09/05/2021   Procedure: ANORECTAL EXAM UNDER ANESTHESIA;  Surgeon: Sheldon Standing, MD;  Location: [facility] ORS;  Service: General;  Laterality: N/A;   TEE WITHOUT CARDIOVERSION  06/20/2011   Procedure: TRANSESOPHAGEAL ECHOCARDIOGRAM (TEE);  Surgeon: Ezra GORMAN Shuck, MD;  Location: [facility] ENDOSCOPY;  Service: Cardiovascular;  Laterality: N/A;    Current Outpatient Medications  Medication Sig Dispense Refill   allopurinol  (ZYLOPRIM )  100 MG tablet TAKE 2 TABLETS BY MOUTH EVERY DAY 180 tablet 0   atorvastatin  (LIPITOR ) 40 MG tablet TAKE 1 TABLET BY MOUTH EVERY DAY 90 tablet 0   benazepril  (LOTENSIN ) 10 MG tablet Take 1 tablet (10 mg total) by mouth daily. (Patient taking differently: Take 10 mg by mouth See admin instructions. Take with 5 mg for a total of 15 mg in the morning) 90 tablet 1   benazepril  (LOTENSIN ) 5 MG tablet TAKE 1 TABLET (5 MG TOTAL) BY MOUTH DAILY. TAKE WITH BENAZEPRIL  10 MG (Patient taking differently: Take 5 mg by mouth See admin instructions. Take with 10 mg of a total of 15 mg in the morning) 90 tablet 0   dicyclomine  (BENTYL ) 10 MG capsule  TAKE 1 CAPSULE (10 MG TOTAL) BY MOUTH 3 (THREE) TIMES DAILY BEFORE MEALS. (Patient not taking: Reported on 09/04/2023) 90 capsule 0   diltiazem  (CARDIZEM  CD) 180 MG 24 hr capsule Take 1 capsule (180 mg total) by mouth daily. 90 capsule 2   dofetilide  (TIKOSYN ) 500 MCG capsule TAKE 1 CAPSULE (500 MCG TOTAL) BY MOUTH 2 (TWO) TIMES DAILY. PLEASE CALL [phone] TO SCHEDULE A MARCH APPOINTMENT WITH DR. KLEIN FOR FUTURE REFILLS. THANK YOU. 180 capsule 2   ELIQUIS  5 MG TABS tablet TAKE 1 TABLET BY MOUTH TWICE A DAY 180 tablet 1   Ferrous Sulfate  (IRON) 325 (65 Fe) MG TABS Take 65 mg by mouth daily.     fluocinonide  gel (LIDEX ) 0.05 % Apply 1 Application topically 2 (two) times daily as needed. (Patient not taking: Reported on 08/20/2023) 60 g 1   folic acid  (FOLVITE ) 1 MG tablet TAKE 1 TABLET BY MOUTH EVERY DAY (Patient not taking: Reported on 08/20/2023) 90 tablet 1   furosemide  (LASIX ) 40 MG tablet Take 40 mg by mouth daily as needed for fluid or edema.     gabapentin  (NEURONTIN ) 100 MG capsule TAKE 1 CAPSULE BY MOUTH AT BEDTIME. 90 capsule 0   levothyroxine  (SYNTHROID ) 112 MCG tablet TAKE 1 TABLET BY MOUTH DAILY BEFORE BREAKFAST. 90 tablet 0   pantoprazole  (PROTONIX ) 40 MG tablet TAKE 1 TABLET BY MOUTH DAILY BEFORE BREAKFAST 90 tablet 0   sucralfate  (CARAFATE ) 1 GM/10ML suspension Take 10 mLs (1 g total) by mouth 4 (four) times daily. (Patient not taking: Reported on 08/20/2023) 1200 mL 3   traMADol  (ULTRAM ) 50 MG tablet Take 1 tablet (50 mg total) by mouth 2 (two) times daily. 180 tablet 1   traMADol  (ULTRAM -ER) 200 MG 24 hr tablet Take 1 tablet (200 mg total) by mouth daily. (Patient taking differently: Take 200 mg by mouth in the morning.) 90 tablet 1   No current facility-administered medications for this visit.    Allergies as of 09/17/2023   (No Known Allergies)    Family History  Problem Relation Age of Onset   Stroke Mother        deceased-MINISTROKES   Lung cancer Father        deceased    Heart disease Father    Colon cancer Neg Hx    Esophageal cancer Neg Hx    Liver disease Neg Hx     Review of Systems:    Constitutional: No weight loss, fever, chills, weakness or fatigue HEENT: Eyes: No change in vision               Ears, Nose, Throat:  No change in hearing or congestion Skin: No rash or itching Cardiovascular: No chest pain, chest pressure or palpitations  Respiratory: No SOB or cough Gastrointestinal: See HPI and otherwise negative Genitourinary: No dysuria or change in urinary frequency Neurological: No headache, dizziness or syncope Musculoskeletal: No new muscle or joint pain Hematologic: No bleeding or bruising Psychiatric: No history of depression or anxiety    Physical Exam:  Vital signs: There were no vitals taken for this visit.  Constitutional:   Pleasant *** male appears to be in NAD, Well developed, Well nourished, alert and cooperative Head:  Normocephalic and atraumatic. Eyes:   PEERL, EOMI. No icterus. Conjunctiva pink. Ears:  Normal auditory acuity. Neck:  Supple Throat: Oral cavity and pharynx without inflammation, swelling or lesion.  Respiratory: Respirations even and unlabored. Lungs clear to auscultation bilaterally.   No wheezes, crackles, or rhonchi.  Cardiovascular: Normal S1, S2. Regular rate and rhythm. No peripheral edema, cyanosis or pallor.  Gastrointestinal:  Soft, nondistended, nontender. No rebound or guarding. Normal bowel sounds. No appreciable masses or hepatomegaly. Rectal:  Not performed.  Anoscopy: Msk:  Symmetrical without gross deformities. Without edema, no deformity or joint abnormality.  Neurologic:  Alert and  oriented x4;  grossly normal neurologically.  Skin:   Dry and intact without significant lesions or rashes. Psychiatric: Oriented to person, place and time. Demonstrates good judgement and reason without abnormal affect or behaviors.  RELEVANT LABS AND IMAGING: CBC    Latest Ref Rng & Units  09/08/2023   12:40 PM 05/01/2023    3:34 PM 01/01/2023    6:43 PM  CBC  WBC 4.0 - 10.5 K/uL 5.5  6.0  7.9   Hemoglobin 13.0 - 17.0 g/dL 86.9  85.9  86.6   Hematocrit 39.0 - 52.0 % 39.0  40.6  40.3   Platelets 150 - 400 K/uL 125  147  148      CMP     Latest Ref Rng & Units 09/08/2023   12:40 PM 05/29/2023   11:29 AM 05/01/2023    3:34 PM  CMP  Glucose 70 - 99 mg/dL 98  882    BUN 8 - 23 mg/dL 17  13    Creatinine 9.38 - 1.24 mg/dL 9.27  8.91    Sodium 864 - 145 mmol/L 143  140    Potassium 3.5 - 5.1 mmol/L 4.0  4.6    Chloride 98 - 111 mmol/L 106  104    CO2 22 - 32 mmol/L 28  27    Calcium  8.9 - 10.3 mg/dL 9.0  9.5    Total Protein 6.1 - 8.1 g/dL   6.9   Total Bilirubin 0.2 - 1.2 mg/dL   0.5   AST 10 - 35 U/L   30   ALT 9 - 46 U/L   37      Lab Results  Component Value Date   TSH 2.34 11/26/2022  06/17/23 : Celiac panel-negative  07/07/23: H. pylori stool antigen - negative   Imaging Studies  HIDA 06/2023 IMPRESSION: Slight delayed clearance of radiotracer from the blood pool suggesting mild underlying hepatocellular dysfunction. Correlation with liver enzymes Tyquan Carmickle be helpful.   Appropriate excretion of radiotracer with clearance from the hepatic parenchyma. Appropriate passage of tracer into the small bowel with appropriate antegrade flow. No leak.   Patency of the cystic duct with filling of gallbladder.   Normal gallbladder ejection fraction   CT angio 07/2023 IMPRESSION: 1. Mild atherosclerotic changes are present in the abdominal aorta and visceral arteries. There is no significant flow limiting stenosis that would suggest mesenteric ischemia as a likely  etiology of abdominal pain.  AUS 04/2023 1. The gallbladder is not able to be visualized or assessed. 2. Increased hepatic parenchymal echogenicity suggestive of steatosis.   KUB 04/2023 The bowel gas pattern is normal. Colonic stool burden within normal limits. There is no evidence of free air. No  radio-opaque calculi or other significant radiographic abnormality is seen. No acute osseous abnormality.   CTAP 12/2022 1. Negative for acute appendicitis. 2. Small fat containing right inguinal hernia. 3. Aortic atherosclerosis.   GI Procedures and Studies  Colonoscopy 02/2020 3 mm colon polyp in AC (TA), small nonbleeding rectal varices, nonbleeding internal hemorrhoids   Colonoscopy 05/2010 6 mm descending colon polyp (TA), internal hemorrhoids   EGD and colonoscopy 03/2005 EGD-granular and edematous mucosa in stomach, otherwise normal Colonoscopy-normal, no polyps   Assessment: Celiac neg H pylori neg CT angio neg Hida scan neg  Plan: Labs ordered today: Celiac panel and H. pylori stool antigen Recommend monitoring hepatic function panel every 6 to 12 months in the setting of hepatic steatosis Schedule HIDA scan for further evaluation of gallbladder and possible biliary dyskinesia If above workup is unremarkable consider CT angiogram to rule out mesenteric ischemia given postprandial abdominal pain and report of weight loss Provided a trial of dicyclomine  10 mg p.o. 3 times daily before meals for possible functional abdominal pain Continue fiber supplements and Metamucil for management of constipation Monitor weight and anthropometrics  Trial of Carafate  1 g p.o. 4 times daily before meals and at bedtime to see if this helps his symptoms Consideration of EGD with biopsies -reviewed risks of anesthesia at his age     Thank you for the courtesy of this consult. Please call me with any questions or concerns.   Donnabelle Blanchard, FNP-C [facility] 09/17/2023, 7:14 AM  Cc: Daryl Setter, NP

## 2023-09-24 ENCOUNTER — Other Ambulatory Visit: Payer: Self-pay | Admitting: Family

## 2023-09-24 MED ORDER — GABAPENTIN 100 MG PO CAPS: 100.0000 mg | ORAL_CAPSULE | Freq: Every day | ORAL | 0 refills | Status: DC

## 2023-09-24 NOTE — Telephone Encounter (Signed)
 Copied from CRM [id number]. Topic: Clinical - Medication Refill >> Sep 24, 2023  9:36 AM Suzen RAMAN wrote: Medication: gabapentin  (NEURONTIN ) 100 MG capsule   Has the patient contacted their pharmacy? Yes   This is the patient's preferred pharmacy:  [facility] [address] [place] - [address] [place] [address] Phone: [phone] Fax: [phone]  Is this the correct pharmacy for this prescription? Yes If no, delete pharmacy and type the correct one.   Has the prescription been filled recently? No  Is the patient out of the medication? Yes  Has the patient been seen for an appointment in the last year OR does the patient have an upcoming appointment? Yes  Can we respond through MyChart? Yes  Agent: Please be advised that Rx refills may take up to 3 business days. We ask that you follow-up with your pharmacy.

## 2023-09-25 ENCOUNTER — Encounter: Payer: Self-pay | Admitting: Family

## 2023-09-25 DIAGNOSIS — E1139 Type 2 diabetes mellitus with other diabetic ophthalmic complication: Secondary | ICD-10-CM | POA: Insufficient documentation

## 2023-10-06 NOTE — Telephone Encounter (Unsigned)
 Copied from CRM [id number]. Topic: Clinical - Medication Refill >> Oct 06, 2023  9:46 AM Harlene ORN wrote: Medication: levothyroxine  (SYNTHROID ) 112 MCG tablet  Has the patient contacted their pharmacy? No (Agent: If no, request that the patient contact the pharmacy for the refill. If patient does not wish to contact the pharmacy document the reason why and proceed with request.) (Agent: If yes, when and what did the pharmacy advise?)  This is the patient's preferred pharmacy:  [id number] [address], [place] - [address] [place] [address] Phone: [phone] Fax: [phone]  Is this the correct pharmacy for this prescription? Yes If no, delete pharmacy and type the correct one.   Has the prescription been filled recently? No  Is the patient out of the medication? No  Has the patient been seen for an appointment in the last year OR does the patient have an upcoming appointment? Yes  Can we respond through MyChart? No  Agent: Please be advised that Rx refills may take up to 3 business days. We ask that you follow-up with your pharmacy.

## 2023-10-12 NOTE — Progress Notes (Signed)
 SABRA

## 2023-10-13 ENCOUNTER — Other Ambulatory Visit: Payer: Self-pay | Admitting: Family

## 2023-10-13 DIAGNOSIS — I10 Essential (primary) hypertension: Secondary | ICD-10-CM

## 2023-10-21 ENCOUNTER — Other Ambulatory Visit: Payer: Self-pay | Admitting: Family

## 2023-10-23 ENCOUNTER — Telehealth: Payer: Self-pay | Admitting: Family

## 2023-10-23 NOTE — Telephone Encounter (Signed)
 I have returned [name]'s call from a voicemail he left on 8/29 stating that he does not need a referral because of [facility]. Aly was not available so I asked [name] to have Esther return my call.

## 2023-10-25 ENCOUNTER — Other Ambulatory Visit: Payer: Self-pay

## 2023-10-25 ENCOUNTER — Emergency Department (HOSPITAL_COMMUNITY)

## 2023-10-25 ENCOUNTER — Encounter (HOSPITAL_COMMUNITY): Payer: Self-pay | Admitting: *Deleted

## 2023-10-25 ENCOUNTER — Inpatient Hospital Stay (HOSPITAL_COMMUNITY)
Admission: EM | Admit: 2023-10-25 | Discharge: 2023-10-30 | DRG: 256 | Disposition: A | Discharge status: Home / Self Care | Attending: Family Medicine | Admitting: Family Medicine

## 2023-10-25 ENCOUNTER — Ambulatory Visit: Admission: EM | Admit: 2023-10-25 | Discharge: 2023-10-25 | Disposition: A | Discharge status: Home / Self Care

## 2023-10-25 DIAGNOSIS — M792 Neuralgia and neuritis, unspecified: Secondary | ICD-10-CM | POA: Diagnosis not present

## 2023-10-25 DIAGNOSIS — I6930 Unspecified sequelae of cerebral infarction: Secondary | ICD-10-CM

## 2023-10-25 DIAGNOSIS — L03115 Cellulitis of right lower limb: Principal | ICD-10-CM | POA: Diagnosis present

## 2023-10-25 DIAGNOSIS — Z7401 Bed confinement status: Secondary | ICD-10-CM | POA: Diagnosis not present

## 2023-10-25 DIAGNOSIS — I10 Essential (primary) hypertension: Secondary | ICD-10-CM | POA: Diagnosis present

## 2023-10-25 DIAGNOSIS — M109 Gout, unspecified: Secondary | ICD-10-CM | POA: Diagnosis present

## 2023-10-25 DIAGNOSIS — M19071 Primary osteoarthritis, right ankle and foot: Secondary | ICD-10-CM | POA: Diagnosis not present

## 2023-10-25 DIAGNOSIS — E039 Hypothyroidism, unspecified: Secondary | ICD-10-CM | POA: Diagnosis present

## 2023-10-25 DIAGNOSIS — M869 Osteomyelitis, unspecified: Secondary | ICD-10-CM | POA: Diagnosis not present

## 2023-10-25 DIAGNOSIS — M85871 Other specified disorders of bone density and structure, right ankle and foot: Secondary | ICD-10-CM | POA: Diagnosis not present

## 2023-10-25 DIAGNOSIS — I4811 Longstanding persistent atrial fibrillation: Secondary | ICD-10-CM | POA: Diagnosis present

## 2023-10-25 DIAGNOSIS — I5032 Chronic diastolic (congestive) heart failure: Secondary | ICD-10-CM | POA: Diagnosis present

## 2023-10-25 DIAGNOSIS — R601 Generalized edema: Secondary | ICD-10-CM | POA: Diagnosis not present

## 2023-10-25 DIAGNOSIS — Z823 Family history of stroke: Secondary | ICD-10-CM

## 2023-10-25 DIAGNOSIS — M86171 Other acute osteomyelitis, right ankle and foot: Secondary | ICD-10-CM | POA: Diagnosis not present

## 2023-10-25 DIAGNOSIS — Z79891 Long term (current) use of opiate analgesic: Secondary | ICD-10-CM

## 2023-10-25 DIAGNOSIS — D696 Thrombocytopenia, unspecified: Secondary | ICD-10-CM | POA: Diagnosis present

## 2023-10-25 DIAGNOSIS — Z860101 Personal history of adenomatous and serrated colon polyps: Secondary | ICD-10-CM

## 2023-10-25 DIAGNOSIS — Z951 Presence of aortocoronary bypass graft: Secondary | ICD-10-CM

## 2023-10-25 DIAGNOSIS — Z8673 Personal history of transient ischemic attack (TIA), and cerebral infarction without residual deficits: Secondary | ICD-10-CM | POA: Diagnosis not present

## 2023-10-25 DIAGNOSIS — Z79890 Hormone replacement therapy (postmenopausal): Secondary | ICD-10-CM | POA: Diagnosis not present

## 2023-10-25 DIAGNOSIS — I639 Cerebral infarction, unspecified: Secondary | ICD-10-CM | POA: Diagnosis not present

## 2023-10-25 DIAGNOSIS — I96 Gangrene, not elsewhere classified: Secondary | ICD-10-CM | POA: Diagnosis present

## 2023-10-25 DIAGNOSIS — E119 Type 2 diabetes mellitus without complications: Secondary | ICD-10-CM | POA: Diagnosis not present

## 2023-10-25 DIAGNOSIS — I69351 Hemiplegia and hemiparesis following cerebral infarction affecting right dominant side: Secondary | ICD-10-CM | POA: Diagnosis not present

## 2023-10-25 DIAGNOSIS — B351 Tinea unguium: Secondary | ICD-10-CM | POA: Diagnosis present

## 2023-10-25 DIAGNOSIS — Z9889 Other specified postprocedural states: Secondary | ICD-10-CM | POA: Diagnosis not present

## 2023-10-25 DIAGNOSIS — L0390 Cellulitis, unspecified: Secondary | ICD-10-CM | POA: Diagnosis not present

## 2023-10-25 DIAGNOSIS — Z79899 Other long term (current) drug therapy: Secondary | ICD-10-CM

## 2023-10-25 DIAGNOSIS — M2041 Other hammer toe(s) (acquired), right foot: Secondary | ICD-10-CM | POA: Diagnosis not present

## 2023-10-25 DIAGNOSIS — Z6830 Body mass index (BMI) 30.0-30.9, adult: Secondary | ICD-10-CM

## 2023-10-25 DIAGNOSIS — I252 Old myocardial infarction: Secondary | ICD-10-CM | POA: Diagnosis not present

## 2023-10-25 DIAGNOSIS — Z7901 Long term (current) use of anticoagulants: Secondary | ICD-10-CM | POA: Diagnosis not present

## 2023-10-25 DIAGNOSIS — M7989 Other specified soft tissue disorders: Secondary | ICD-10-CM | POA: Diagnosis not present

## 2023-10-25 DIAGNOSIS — R32 Unspecified urinary incontinence: Secondary | ICD-10-CM | POA: Diagnosis present

## 2023-10-25 DIAGNOSIS — E785 Hyperlipidemia, unspecified: Secondary | ICD-10-CM | POA: Diagnosis not present

## 2023-10-25 DIAGNOSIS — Z801 Family history of malignant neoplasm of trachea, bronchus and lung: Secondary | ICD-10-CM

## 2023-10-25 DIAGNOSIS — G8929 Other chronic pain: Secondary | ICD-10-CM | POA: Diagnosis not present

## 2023-10-25 DIAGNOSIS — R252 Cramp and spasm: Secondary | ICD-10-CM | POA: Diagnosis present

## 2023-10-25 DIAGNOSIS — G4733 Obstructive sleep apnea (adult) (pediatric): Secondary | ICD-10-CM | POA: Diagnosis present

## 2023-10-25 DIAGNOSIS — Z8249 Family history of ischemic heart disease and other diseases of the circulatory system: Secondary | ICD-10-CM | POA: Diagnosis not present

## 2023-10-25 DIAGNOSIS — R600 Localized edema: Secondary | ICD-10-CM | POA: Diagnosis not present

## 2023-10-25 DIAGNOSIS — S98111D Complete traumatic amputation of right great toe, subsequent encounter: Secondary | ICD-10-CM | POA: Diagnosis not present

## 2023-10-25 DIAGNOSIS — K219 Gastro-esophageal reflux disease without esophagitis: Secondary | ICD-10-CM | POA: Diagnosis present

## 2023-10-25 DIAGNOSIS — I110 Hypertensive heart disease with heart failure: Secondary | ICD-10-CM | POA: Diagnosis present

## 2023-10-25 DIAGNOSIS — M86671 Other chronic osteomyelitis, right ankle and foot: Secondary | ICD-10-CM | POA: Diagnosis not present

## 2023-10-25 DIAGNOSIS — I25118 Atherosclerotic heart disease of native coronary artery with other forms of angina pectoris: Secondary | ICD-10-CM | POA: Diagnosis present

## 2023-10-25 DIAGNOSIS — R531 Weakness: Secondary | ICD-10-CM | POA: Diagnosis not present

## 2023-10-25 DIAGNOSIS — I2510 Atherosclerotic heart disease of native coronary artery without angina pectoris: Secondary | ICD-10-CM | POA: Diagnosis not present

## 2023-10-25 DIAGNOSIS — M7731 Calcaneal spur, right foot: Secondary | ICD-10-CM | POA: Diagnosis not present

## 2023-10-25 DIAGNOSIS — M2011 Hallux valgus (acquired), right foot: Secondary | ICD-10-CM | POA: Diagnosis not present

## 2023-10-25 DIAGNOSIS — E1169 Type 2 diabetes mellitus with other specified complication: Secondary | ICD-10-CM | POA: Diagnosis not present

## 2023-10-25 DIAGNOSIS — E66811 Obesity, class 1: Secondary | ICD-10-CM | POA: Diagnosis present

## 2023-10-25 DIAGNOSIS — E1152 Type 2 diabetes mellitus with diabetic peripheral angiopathy with gangrene: Secondary | ICD-10-CM | POA: Diagnosis not present

## 2023-10-25 DIAGNOSIS — I480 Paroxysmal atrial fibrillation: Secondary | ICD-10-CM | POA: Diagnosis not present

## 2023-10-25 HISTORY — DX: Prediabetes: R73.03

## 2023-10-25 LAB — COMPREHENSIVE METABOLIC PANEL WITH GFR
ALT: 43 U/L (ref 0–44)
AST: 45 U/L — ABNORMAL HIGH (ref 15–41)
Albumin: 3.9 g/dL (ref 3.5–5.0)
Alkaline Phosphatase: 65 U/L (ref 38–126)
Anion gap: 10 (ref 5–15)
BUN: 13 mg/dL (ref 8–23)
CO2: 28 mmol/L (ref 22–32)
Calcium: 9.3 mg/dL (ref 8.9–10.3)
Chloride: 101 mmol/L (ref 98–111)
Creatinine, Ser: 1.05 mg/dL (ref 0.61–1.24)
GFR, Estimated: >60 mL/min (ref >60)
Glucose, Bld: 129 mg/dL — ABNORMAL HIGH (ref 70–99)
Potassium: 4.3 mmol/L (ref 3.5–5.1)
Sodium: 139 mmol/L (ref 135–145)
Total Bilirubin: 1.2 mg/dL (ref 0.0–1.2)
Total Protein: 7.5 g/dL (ref 6.5–8.1)

## 2023-10-25 LAB — CBC WITH DIFFERENTIAL/PLATELET
Abs Immature Granulocytes: 0.02 K/uL (ref 0.00–0.07)
Basophils Absolute: 0.1 K/uL (ref 0.0–0.1)
Basophils Relative: 1 %
Eosinophils Absolute: 0.2 K/uL (ref 0.0–0.5)
Eosinophils Relative: 2 %
HCT: 40.5 % (ref 39.0–52.0)
Hemoglobin: 13 g/dL (ref 13.0–17.0)
Immature Granulocytes: 0 %
Lymphocytes Relative: 18 %
Lymphs Abs: 1.6 K/uL (ref 0.7–4.0)
MCH: 30.6 pg (ref 26.0–34.0)
MCHC: 32.1 g/dL (ref 30.0–36.0)
MCV: 95.3 fL (ref 80.0–100.0)
Monocytes Absolute: 0.8 K/uL (ref 0.1–1.0)
Monocytes Relative: 9 %
Neutro Abs: 6.6 K/uL (ref 1.7–7.7)
Neutrophils Relative %: 70 %
Platelets: 172 K/uL (ref 150–400)
RBC: 4.25 MIL/uL (ref 4.22–5.81)
RDW: 12.8 % (ref 11.5–15.5)
WBC: 9.3 K/uL (ref 4.0–10.5)
nRBC: 0 % (ref 0.0–0.2)

## 2023-10-25 LAB — PROTIME-INR
INR: 1.3 — ABNORMAL HIGH (ref 0.8–1.2)
Prothrombin Time: 16.8 s — ABNORMAL HIGH (ref 11.4–15.2)

## 2023-10-25 LAB — I-STAT CG4 LACTIC ACID, ED
Lactic Acid, Venous: 1.1 mmol/L (ref 0.5–1.9)
Lactic Acid, Venous: 1.1 mmol/L (ref 0.5–1.9)

## 2023-10-25 LAB — CBG MONITORING, ED: Glucose-Capillary: 137 mg/dL — ABNORMAL HIGH (ref 70–99)

## 2023-10-25 MED ORDER — APIXABAN 5 MG PO TABS
5.0000 mg | ORAL_TABLET | Freq: Two times a day (BID) | ORAL | Status: DC
  Administered 2023-10-25 – 2023-10-26 (×3): 5 mg via ORAL
  Filled 2023-10-25 (×2): qty 1

## 2023-10-25 MED ORDER — OXYCODONE HCL 5 MG PO TABS: 5.0000 mg | ORAL_TABLET | ORAL | Status: DC | PRN

## 2023-10-25 MED ORDER — ALLOPURINOL 100 MG PO TABS
200.0000 mg | ORAL_TABLET | Freq: Every day | ORAL | Status: DC
Start: 2023-10-26 — End: 2023-10-30
  Administered 2023-10-26 – 2023-10-30 (×8): 200 mg via ORAL
  Filled 2023-10-25 (×5): qty 2

## 2023-10-25 MED ORDER — VANCOMYCIN HCL 2000 MG/400ML IV SOLN
2000.0000 mg | Freq: Once | INTRAVENOUS | Status: AC
  Administered 2023-10-25: 2000 mg via INTRAVENOUS
  Filled 2023-10-25 (×2): qty 400

## 2023-10-25 MED ORDER — DILTIAZEM HCL ER COATED BEADS 180 MG PO CP24
180.0000 mg | ORAL_CAPSULE | Freq: Every day | ORAL | Status: DC
Start: 2023-10-26 — End: 2023-10-30
  Administered 2023-10-26 – 2023-10-30 (×8): 180 mg via ORAL
  Filled 2023-10-25 (×5): qty 1

## 2023-10-25 MED ORDER — PANTOPRAZOLE SODIUM 40 MG PO TBEC: 40.0000 mg | DELAYED_RELEASE_TABLET | Freq: Every day | ORAL | Status: DC

## 2023-10-25 MED ORDER — SODIUM CHLORIDE 0.9 % IV SOLN
2.0000 g | Freq: Once | INTRAVENOUS | Status: AC
  Administered 2023-10-25: 2 g via INTRAVENOUS
  Filled 2023-10-25: qty 12.5

## 2023-10-25 MED ORDER — GABAPENTIN 100 MG PO CAPS
100.0000 mg | ORAL_CAPSULE | Freq: Every day | ORAL | Status: DC
  Administered 2023-10-25 – 2023-10-29 (×8): 100 mg via ORAL
  Filled 2023-10-25 (×5): qty 1

## 2023-10-25 MED ORDER — ATORVASTATIN CALCIUM 40 MG PO TABS
40.0000 mg | ORAL_TABLET | Freq: Every day | ORAL | Status: DC
Start: 2023-10-26 — End: 2023-10-30
  Administered 2023-10-26 – 2023-10-30 (×8): 40 mg via ORAL
  Filled 2023-10-25 (×5): qty 1

## 2023-10-25 MED ORDER — TRAMADOL HCL 50 MG PO TABS
50.0000 mg | ORAL_TABLET | Freq: Four times a day (QID) | ORAL | Status: DC | PRN
  Administered 2023-10-26 – 2023-10-29 (×7): 50 mg via ORAL
  Filled 2023-10-25 (×5): qty 1

## 2023-10-25 MED ORDER — ACETAMINOPHEN 325 MG PO TABS
650.0000 mg | ORAL_TABLET | Freq: Four times a day (QID) | ORAL | Status: DC | PRN
  Administered 2023-10-28 (×2): 650 mg via ORAL

## 2023-10-25 MED ORDER — OXYCODONE HCL 5 MG PO TABS
5.0000 mg | ORAL_TABLET | ORAL | Status: DC | PRN
  Administered 2023-10-28 (×2): 10 mg via ORAL
  Filled 2023-10-25 (×2): qty 2

## 2023-10-25 MED ORDER — BENAZEPRIL HCL 5 MG PO TABS
15.0000 mg | ORAL_TABLET | Freq: Every day | ORAL | Status: DC
Start: 2023-10-26 — End: 2023-10-30
  Administered 2023-10-26 – 2023-10-30 (×8): 15 mg via ORAL
  Filled 2023-10-25 (×5): qty 3

## 2023-10-25 MED ORDER — LEVOTHYROXINE SODIUM 112 MCG PO TABS
112.0000 ug | ORAL_TABLET | Freq: Every day | ORAL | Status: DC
  Administered 2023-10-26 – 2023-10-30 (×8): 112 ug via ORAL
  Filled 2023-10-25 (×5): qty 1

## 2023-10-25 MED ORDER — ACETAMINOPHEN 650 MG RE SUPP: 650.0000 mg | Freq: Four times a day (QID) | RECTAL | Status: DC | PRN

## 2023-10-25 MED ORDER — VANCOMYCIN HCL 1500 MG/300ML IV SOLN
1500.0000 mg | INTRAVENOUS | Status: DC
  Administered 2023-10-26 – 2023-10-27 (×4): 1500 mg via INTRAVENOUS
  Filled 2023-10-25 (×2): qty 300

## 2023-10-25 MED ORDER — DOFETILIDE 500 MCG PO CAPS
500.0000 ug | ORAL_CAPSULE | Freq: Two times a day (BID) | ORAL | Status: DC
  Administered 2023-10-26 – 2023-10-30 (×17): 500 ug via ORAL
  Filled 2023-10-25 (×12): qty 1

## 2023-10-25 NOTE — H&P (Signed)
 History and Physical    [name] FMW:[id number] DOB: 1946/02/19 DOA: 10/25/2023  PCP: Daryl Setter, NP   Patient coming from: Home   Chief Complaint: Right foot redness, swelling, and drainage   HPI: [name] is a 78 y.o. male with medical history significant for hypertension, diet-controlled diabetes mellitus, CAD status post CABG, chronic HFpEF, atrial fibrillation on Eliquis , OSA with CPAP intolerance, and hypothyroidism who presents with worsening right foot redness, swelling, and drainage.  Patient noticed that his right foot was sore yesterday.  This morning, he had severe right forefoot pain, erythema, and noted purulent drainage from the first toe.  He denies fevers, chills, or any other rash or wound.  He denies any recent chest pain.  ED Course: Upon arrival to the ED, patient is found to have oral temp of 37.7 C, normal HR, and stable BP.  Labs are most notable for normal creatinine, normal WBC, and normal lactate.  Plain radiographs are notable for diffuse soft tissue swelling but no acute osseous abnormality.  Blood culture was collected and the patient was given cefepime  and vancomycin  in the ED.  Review of Systems:  All other systems reviewed and apart from HPI, are negative.  Past Medical History:  Diagnosis Date   Acute encephalopathy 12/12/2015   Acute respiratory failure with hypoxia and hypercapnia (HCC) 12/12/2015   Allergic rhinitis 02/13/2010   Qualifier: Diagnosis of  By: Georgian ROSALEA CHARM Lamar    Angina at rest [facility]) 10/22/2015   Arthritis    Ataxia 08/10/2013   Atrial fibrillation (HCC) 11/2018   B12 deficiency 08/11/2013   BACK PAIN, LUMBAR 01/29/2009   Qualifier: Diagnosis of  By: Georgian ROSALEA CHARM Lamar    BENIGN POSITIONAL VERTIGO 11/23/2009   Qualifier: Diagnosis of  By: Georgian ROSALEA CHARM Lamar    Chronic diastolic heart failure (HCC) 04/24/2015   Chronic pain    left sided-Kristeins   Chronically elevated hemidiaphragm    right   Coronary  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 (HCC) 11/04/2015   s/p CABG 2004; CFX DES 2011   Elevated troponin    Essential hypertension 12/12/2006   Qualifier: Diagnosis of  By: Wilhemina RMA, Lucy     GERD 12/12/2006   Qualifier: Diagnosis of  By: Wilhemina KRAFT, Lucy     GOUT 12/12/2006   Qualifier: Diagnosis of  By: Wilhemina RMA, Lucy     Hemorrhoids    Hyperlipidemia LDL goal <70 12/12/2006   Qualifier: Diagnosis of  By: Wilhemina RMA, Lucy      Hypogonadism male 04/10/2011   Hypothyroidism, acquired 02/15/2013   Insomnia 05/21/2013   Long term current use of anticoagulant therapy 06/24/2011   Myocardial infarction (HCC)    Obstructive sleep apnea-Failed CPAP 06/17/2007   Failed CPAP Using O 2 for sleep    On home oxygen  therapy    2L at night for sleep apnea   Paroxysmal atrial fibrillation (HCC) 12/06/2016   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; resultant hemiplegia of dominant side   Thoracic aorta atherosclerosis (HCC) 04/27/2017   Tubular adenoma of colon 05/2010   Vasovagal syncope    Vasovagal syncope    Wears glasses     Past Surgical History:  Procedure Laterality Date   CARDIAC CATHETERIZATION N/A 10/25/2015   Procedure: Left Heart Cath and Cors/Grafts Angiography;  Surgeon: Debby DELENA Sor, MD;  Location: [facility] INVASIVE CV LAB;  Service: Cardiovascular;  Laterality: N/A;   CARDIOVERSION  06/20/2011  Procedure: CARDIOVERSION;  Surgeon: Ezra GORMAN Shuck, MD;  Location: [facility] ENDOSCOPY;  Service: Cardiovascular;  Laterality: N/A;   CARDIOVERSION N/A 04/26/2015   Procedure: CARDIOVERSION;  Surgeon: Leim VEAR Moose, MD;  Location: [facility] ENDOSCOPY;  Service: Cardiovascular;  Laterality: N/A;   CARDIOVERSION N/A 05/10/2015   Procedure: CARDIOVERSION;  Surgeon: Ezra GORMAN Shuck, MD;  Location: [facility] ENDOSCOPY;  Service: Cardiovascular;  Laterality: N/A;   CARDIOVERSION N/A 12/10/2018   Procedure: CARDIOVERSION;  Surgeon: Francyne Headland, MD;  Location: [facility] ENDOSCOPY;  Service:  Cardiovascular;  Laterality: N/A;   CARPOMETACARPAL (CMC) FUSION OF THUMB Right 07/18/2022   Procedure: right carpo metacarpal suspensionplasty with abductor tendon transfer right wrist stenosing tenosynovitis release, right index carpo metacarpal boss excision;  Surgeon: Sissy Cough, MD;  Location: [facility] OR;  Service: Orthopedics;  Laterality: Right;   CATARACT EXTRACTION  05/2010   left eye   CATARACT EXTRACTION  04/2010   right eye   COLONOSCOPY WITH PROPOFOL  N/A 02/22/2020   Procedure: COLONOSCOPY WITH PROPOFOL ;  Surgeon: San Sandor GAILS, DO;  Location: [facility] ENDOSCOPY;  Service: Gastroenterology;  Laterality: N/A;   CORONARY ARTERY BYPASS GRAFT     stent   DORSAL COMPARTMENT RELEASE Right 07/18/2022   Procedure: RELEASE DORSAL COMPARTMENT (DEQUERVAIN);  Surgeon: Sissy Cough, MD;  Location: [facility] OR;  Service: Orthopedics;  Laterality: Right;  Anethesia Axillary block/ MAC   ESOPHAGOGASTRODUODENOSCOPY  03-25-2005   EXCISION OF BACK LESION N/A 09/08/2023   Procedure: EXCISION, LESION, BACK;  Surgeon: Polly Cordella LABOR, MD;  Location: [facility] ORS;  Service: General;  Laterality: N/A;  EXCISION OF LESION UPPER MID BACK   HEMORRHOID SURGERY N/A 09/05/2021   Procedure: HEMORRHOIDECTOMY WITH LIGATION AND HEMORRHOIDOPEXY;  Surgeon: Sheldon Standing, MD;  Location: [facility] ORS;  Service: General;  Laterality: N/A;   LEFT HEART CATH AND CORS/GRAFTS ANGIOGRAPHY N/A 02/19/2018   Procedure: LEFT HEART CATH AND CORS/GRAFTS ANGIOGRAPHY;  Surgeon: [place]azil[place], Peter M, MD;  Location: [facility] INVASIVE CV LAB;  Service: Cardiovascular;  Laterality: N/A;   NASAL SEPTUM SURGERY     PERCUTANEOUS PLACEMENT INTRAVASCULAR STENT CERVICAL CAROTID ARTERY     03-2009; using a drug-eluting platform of the circumflex cornoray artery with a 3.0 x 18 [employer] Promus drug-eluting platform post dilated to 3.75 with a noncompliant balloon.   POLYPECTOMY  02/22/2020   Procedure: POLYPECTOMY;  Surgeon: San Sandor GAILS, DO;  Location: [facility]  ENDOSCOPY;  Service: Gastroenterology;;   RECTAL EXAM UNDER ANESTHESIA N/A 09/05/2021   Procedure: ANORECTAL EXAM UNDER ANESTHESIA;  Surgeon: Sheldon Standing, MD;  Location: [facility] ORS;  Service: General;  Laterality: N/A;   TEE WITHOUT CARDIOVERSION  06/20/2011   Procedure: TRANSESOPHAGEAL ECHOCARDIOGRAM (TEE);  Surgeon: Ezra GORMAN Shuck, MD;  Location: [facility] ENDOSCOPY;  Service: Cardiovascular;  Laterality: N/A;    Social History:   reports that he has never smoked. He has never used smokeless tobacco. He reports that he does not currently use alcohol. He reports that he does not use drugs.  No Known Allergies  Family History  Problem Relation Age of Onset   Stroke Mother        deceased-MINISTROKES   Lung cancer Father        deceased   Heart disease Father    Colon cancer Neg Hx    Esophageal cancer Neg Hx    Liver disease Neg Hx      Prior to Admission medications   Medication Sig Start Date End Date Taking? Authorizing Provider  allopurinol  (ZYLOPRIM ) 100 MG tablet TAKE 2 TABLETS  BY MOUTH EVERY DAY 10/21/23   O'Sullivan, Melissa, NP  atorvastatin  (LIPITOR ) 40 MG tablet TAKE 1 TABLET BY MOUTH EVERY DAY 09/12/23   O'Sullivan, Melissa, NP  benazepril  (LOTENSIN ) 10 MG tablet Take 1 tablet (10 mg total) by mouth daily. Patient taking differently: Take 10 mg by mouth See admin instructions. Take with 5 mg for a total of 15 mg in the morning 06/17/23   O'Sullivan, Melissa, NP  benazepril  (LOTENSIN ) 5 MG tablet TAKE 1 TABLET (5 MG TOTAL) BY MOUTH DAILY. TAKE WITH BENAZEPRIL  10 MG 10/13/23   O'Sullivan, Melissa, NP  dicyclomine  (BENTYL ) 10 MG capsule TAKE 1 CAPSULE (10 MG TOTAL) BY MOUTH 3 (THREE) TIMES DAILY BEFORE MEALS. Patient not taking: Reported on 09/04/2023 08/14/23   Suzann Inocente HERO, MD  diltiazem  (CARDIZEM  CD) 180 MG 24 hr capsule Take 1 capsule (180 mg total) by mouth daily. 10/21/22 09/08/23  Fenton, Clint R, PA  dofetilide  (TIKOSYN ) 500 MCG capsule TAKE 1 CAPSULE (500 MCG TOTAL) BY MOUTH 2  (TWO) TIMES DAILY. PLEASE CALL [phone] TO SCHEDULE A MARCH APPOINTMENT WITH DR. KLEIN FOR FUTURE REFILLS. THANK YOU. 07/17/23   Fenton, Clint R, PA  ELIQUIS  5 MG TABS tablet TAKE 1 TABLET BY MOUTH TWICE A DAY 06/17/23   Jeffrie Oneil BROCKS, MD  Ferrous Sulfate  (IRON) 325 (65 Fe) MG TABS Take 65 mg by mouth daily.    [provider]  fluocinonide  gel (LIDEX ) 0.05 % Apply 1 Application topically 2 (two) times daily as needed. Patient not taking: Reported on 08/20/2023 11/26/22   O'Sullivan, Melissa, NP  folic acid  (FOLVITE ) 1 MG tablet TAKE 1 TABLET BY MOUTH EVERY DAY Patient not taking: Reported on 08/20/2023 01/20/23   O'Sullivan, Melissa, NP  furosemide  (LASIX ) 40 MG tablet Take 40 mg by mouth daily as needed for fluid or edema.    [provider]  gabapentin  (NEURONTIN ) 100 MG capsule Take 1 capsule (100 mg total) by mouth at bedtime. 09/24/23   O'Sullivan, Melissa, NP  levothyroxine  (SYNTHROID ) 112 MCG tablet TAKE 1 TABLET BY MOUTH DAILY BEFORE BREAKFAST. 09/13/23   O'Sullivan, Melissa, NP  pantoprazole  (PROTONIX ) 40 MG tablet TAKE 1 TABLET BY MOUTH DAILY BEFORE BREAKFAST 10/13/23   Daryl Setter, NP  sucralfate  (CARAFATE ) 1 GM/10ML suspension Take 10 mLs (1 g total) by mouth 4 (four) times daily. Patient not taking: Reported on 08/20/2023 08/11/23 08/10/24  Suzann Inocente HERO, MD  traMADol  (ULTRAM ) 50 MG tablet Take 1 tablet (50 mg total) by mouth 2 (two) times daily. 09/01/23   Kirsteins, Prentice BRAVO, MD  traMADol  (ULTRAM -ER) 200 MG 24 hr tablet Take 1 tablet (200 mg total) by mouth daily. Patient taking differently: Take 200 mg by mouth in the morning. 09/01/23   Carilyn Prentice BRAVO, MD    Physical Exam: Vitals:   10/25/23 1640 10/25/23 1641 10/25/23 1808  BP: (!) 156/83  100/79  Pulse: (!) 49  95  Resp: 14  16  Temp: 99.9 F (37.7 C)    TempSrc: Oral    SpO2: 95%  98%  Weight:  111.1 kg   Height:  6' 3 (1.905 m)     Constitutional: NAD, no pallor or diaphoresis   Eyes:  PERTLA, lids and conjunctivae normal ENMT: Mucous membranes are moist. Posterior pharynx clear of any exudate or lesions.   Neck: supple, no masses  Respiratory:  no wheezing, no crackles. No accessory muscle use.  Cardiovascular: S1 & S2 heard, regular rate and rhythm. No JVD. Abdomen: No tenderness, soft.  Bowel sounds active.  Musculoskeletal: no clubbing / cyanosis. No joint deformity upper and lower extremities.   Skin: Right forefoot erythema, heat, and tenderness. Skin otherwise warm, dry, well-perfused. Neurologic: Mild dysarthria. Hemiparesis. Alert and oriented.  Psychiatric: Calm. Cooperative.    Labs and Imaging on Admission: I have personally reviewed following labs and imaging studies  CBC: Recent Labs  Lab 10/25/23 1653  WBC 9.3  NEUTROABS 6.6  HGB 13.0  HCT 40.5  MCV 95.3  PLT 172   Basic Metabolic Panel: Recent Labs  Lab 10/25/23 1653  NA 139  K 4.3  CL 101  CO2 28  GLUCOSE 129*  BUN 13  CREATININE 1.05  CALCIUM  9.3   GFR: Estimated Creatinine Clearance: 75.5 mL/min (by C-G formula based on SCr of 1.05 mg/dL). Liver Function Tests: Recent Labs  Lab 10/25/23 1653  AST 45*  ALT 43  ALKPHOS 65  BILITOT 1.2  PROT 7.5  ALBUMIN 3.9   No results for input(s): LIPASE, AMYLASE in the last 168 hours. No results for input(s): AMMONIA in the last 168 hours. Coagulation Profile: Recent Labs  Lab 10/25/23 1653  INR 1.3*   Cardiac Enzymes: No results for input(s): CKTOTAL, CKMB, CKMBINDEX, TROPONINI in the last 168 hours. BNP (last 3 results) No results for input(s): PROBNP in the last 8760 hours. HbA1C: No results for input(s): HGBA1C in the last 72 hours. CBG: Recent Labs  Lab 10/25/23 1648  GLUCAP 137*   Lipid Profile: No results for input(s): CHOL, HDL, LDLCALC, TRIG, CHOLHDL, LDLDIRECT in the last 72 hours. Thyroid  Function Tests: No results for input(s): TSH, T4TOTAL, FREET4, T3FREE, THYROIDAB in  the last 72 hours. Anemia Panel: No results for input(s): VITAMINB12, FOLATE, FERRITIN, TIBC, IRON, RETICCTPCT in the last 72 hours. Urine analysis:    Component Value Date/Time   COLORURINE YELLOW 01/02/2023 0429   APPEARANCEUR HAZY (A) 01/02/2023 0429   LABSPEC >1.046 (H) 01/02/2023 0429   PHURINE 5.0 01/02/2023 0429   GLUCOSEU NEGATIVE 01/02/2023 0429   HGBUR NEGATIVE 01/02/2023 0429   BILIRUBINUR NEGATIVE 01/02/2023 0429   BILIRUBINUR neg 04/27/2017 1258   KETONESUR NEGATIVE 01/02/2023 0429   PROTEINUR NEGATIVE 01/02/2023 0429   UROBILINOGEN 0.2 04/27/2017 1258   UROBILINOGEN 1.0 11/25/2014 1806   NITRITE NEGATIVE 01/02/2023 0429   LEUKOCYTESUR NEGATIVE 01/02/2023 0429   Sepsis Labs: @LABRCNTIP (procalcitonin:4,lacticidven:4) )No results found for this or any previous visit (from the past 240 hours).   Radiological Exams on Admission: DG Foot Complete Right Result Date: 10/25/2023 CLINICAL DATA:  red swollen low grade temp since this am EXAM: RIGHT FOOT COMPLETE - 3+ VIEW COMPARISON:  October 08, 2014 FINDINGS: Osteopenia. Hallux valgus deformity. 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 Moderate degenerative changes of the midfoot. No acute fracture or dislocation. There is no evidence of arthropathy or other focal bone abnormality. Diffuse soft tissue swelling about the foot. No radiopaque foreign body. IMPRESSION: Diffuse soft tissue swelling about the foot. No radiographic findings of osteomyelitis. Electronically Signed   By: Rogelia Myers M.D.   On: 10/25/2023 17:41     Assessment/Plan   1. Right foot cellulitis  - Continue vancomycin  for purulent cellulitis, follow cultures and clinical course, obtain advanced imaging if worsens or fails to improve    2. Atrial fibrillation  - Eliquis , Tikosyn , diltiazem     3. CAD  - S/p CABG - Eliquis , Lipitor    4. Hx of CVA  - Lipitor ,  Eliquis   5. Hypertension  - Benazepril , diltiazem     6. Type II DM  - Diet-controlled at home  - Check CBGs, use low-intensity SSI if needed    7. Hypothyroidism  - Synthroid     DVT prophylaxis: Eliquis   Code Status: Full  Level of Care: Level of care: Med-Surg Family Communication: None present   Disposition Plan:  Patient is from: Home Anticipated d/c is to: Home  Anticipated d/c date is: 10/28/23 Patient currently: Pending clinical improvement  Consults called: None  Admission status: Inpatient     Evalene GORMAN Sprinkles, MD [facility]  10/25/2023, 7:52 PM

## 2023-10-25 NOTE — Progress Notes (Signed)
 ED Pharmacy Antibiotic Sign Off An antibiotic consult was received from an ED provider for vancomycin  and cefepime  per pharmacy dosing for wound infection. A chart review was completed to assess appropriateness.   The following one time order(s) were placed:  Vancomycin  2g IV x 1 Cefepime  2g Iv x 1  Further antibiotic and/or antibiotic pharmacy consults should be ordered by the admitting provider if indicated.   Thank you for allowing pharmacy to be a part of this patient's care.   Dorn Poot, [facility]  Clinical Pharmacist 10/25/23 6:50 PM

## 2023-10-25 NOTE — ED Provider Notes (Signed)
 [facility] Provider Note   CSN: [id number] Arrival date & time: 10/25/23  1630     Patient presents with: foot red and swollen   [name] is a 78 y.o. male past medical history significant for hypertension, hyperlipidemia, type 2 diabetes, CAD status post CABG, HFpEF, atrial fibrillation on Eliquis , OSA on 2 L nasal cannula at night, hypothyroidism who presents emergency department for right foot swelling and pain.  Patient is accompanied by his wife who helps provide medical history.  Patient's wife states that last night the patient's foot was very mildly red on his toes.  Patient states he awoke this morning and had significant worsening of edema, erythema and tenderness of right foot up to the midfoot.  Patient denies associated fever, nausea, vomiting, shortness of breath or chest pain.  Patient denies trauma or falls.   HPI     Prior to Admission medications   Medication Sig Start Date End Date Taking? Authorizing Provider  allopurinol  (ZYLOPRIM ) 100 MG tablet TAKE 2 TABLETS BY MOUTH EVERY DAY 10/21/23  Yes O'Sullivan, Melissa, NP  atorvastatin  (LIPITOR ) 40 MG tablet TAKE 1 TABLET BY MOUTH EVERY DAY 09/12/23  Yes O'Sullivan, Melissa, NP  benazepril  (LOTENSIN ) 10 MG tablet Take 1 tablet (10 mg total) by mouth daily. Patient taking differently: Take 10 mg by mouth See admin instructions. Take with 5 mg for a total of 15 mg in the morning 06/17/23  Yes O'Sullivan, Melissa, NP  benazepril  (LOTENSIN ) 5 MG tablet TAKE 1 TABLET (5 MG TOTAL) BY MOUTH DAILY. TAKE WITH BENAZEPRIL  10 MG 10/13/23  Yes O'Sullivan, Melissa, NP  dofetilide  (TIKOSYN ) 500 MCG capsule TAKE 1 CAPSULE (500 MCG TOTAL) BY MOUTH 2 (TWO) TIMES DAILY. PLEASE CALL [phone] TO SCHEDULE A MARCH APPOINTMENT WITH DR. KLEIN FOR FUTURE REFILLS. THANK YOU. 07/17/23  Yes Fenton, Clint R, PA  ELIQUIS  5 MG TABS tablet TAKE 1 TABLET BY MOUTH TWICE A DAY 06/17/23  Yes Jeffrie Oneil BROCKS, MD   Ferrous Sulfate  (IRON) 325 (65 Fe) MG TABS Take 65 mg by mouth daily.   Yes [provider]  furosemide  (LASIX ) 40 MG tablet Take 40 mg by mouth daily as needed for fluid or edema.   Yes [provider]  gabapentin  (NEURONTIN ) 100 MG capsule Take 1 capsule (100 mg total) by mouth at bedtime. 09/24/23  Yes Daryl Setter, NP  levothyroxine  (SYNTHROID ) 112 MCG tablet TAKE 1 TABLET BY MOUTH DAILY BEFORE BREAKFAST. 09/13/23  Yes O'Sullivan, Melissa, NP  traMADol  (ULTRAM ) 50 MG tablet Take 1 tablet (50 mg total) by mouth 2 (two) times daily. 09/01/23  Yes Kirsteins, Prentice BRAVO, MD  traMADol  (ULTRAM -ER) 200 MG 24 hr tablet Take 1 tablet (200 mg total) by mouth daily. Patient taking differently: Take 200 mg by mouth in the morning. 09/01/23  Yes Kirsteins, Prentice BRAVO, MD  dicyclomine  (BENTYL ) 10 MG capsule TAKE 1 CAPSULE (10 MG TOTAL) BY MOUTH 3 (THREE) TIMES DAILY BEFORE MEALS. Patient not taking: Reported on 09/04/2023 08/14/23   Suzann Inocente HERO, MD  diltiazem  (CARDIZEM  CD) 180 MG 24 hr capsule Take 1 capsule (180 mg total) by mouth daily. 10/21/22 09/08/23  Fenton, Clint R, PA  fluocinonide  gel (LIDEX ) 0.05 % Apply 1 Application topically 2 (two) times daily as needed. Patient not taking: Reported on 08/20/2023 11/26/22   O'Sullivan, Melissa, NP  folic acid  (FOLVITE ) 1 MG tablet TAKE 1 TABLET BY MOUTH EVERY DAY Patient not taking: Reported on 08/20/2023 01/20/23  Daryl Setter, NP  pantoprazole  (PROTONIX ) 40 MG tablet TAKE 1 TABLET BY MOUTH DAILY BEFORE BREAKFAST Patient not taking: Reported on 10/25/2023 10/13/23   O'Sullivan, Melissa, NP  sucralfate  (CARAFATE ) 1 GM/10ML suspension Take 10 mLs (1 g total) by mouth 4 (four) times daily. Patient not taking: Reported on 08/20/2023 08/11/23 08/10/24  Suzann Inocente HERO, MD    Allergies: Patient has no known allergies.    Review of Systems  Updated Vital Signs BP 121/86 (BP Location: Right Arm)   Pulse 91   Temp 98.9 F (37.2 C) (Oral)    Resp 16   Ht 6' 3 (1.905 m)   Wt 111.1 kg   SpO2 96%   BMI 30.61 kg/m   Physical Exam Constitutional:      General: He is not in acute distress.    Appearance: He is not ill-appearing.  Cardiovascular:     Rate and Rhythm: Normal rate and regular rhythm.     Pulses:          Posterior tibial pulses are 2+ on the right side and 2+ on the left side.  Pulmonary:     Effort: Pulmonary effort is normal. No tachypnea.  Musculoskeletal:     Comments: Right foot with significant erythema and edema up to mid foot with tenderness to palpation overlying anterior digits and purulent drainage of big toe.  Full passive range of motion of ankle.  Please review the image below for details  Neurological:     Mental Status: He is alert.  Psychiatric:        Behavior: Behavior is cooperative.     (all labs ordered are listed, but only abnormal results are displayed) Labs Reviewed  COMPREHENSIVE METABOLIC PANEL WITH GFR - Abnormal; Notable for the following components:      Result Value   Glucose, Bld 129 (*)    AST 45 (*)    All other components within normal limits  PROTIME-INR - Abnormal; Notable for the following components:   Prothrombin Time 16.8 (*)    INR 1.3 (*)    All other components within normal limits  CBG MONITORING, ED - Abnormal; Notable for the following components:   Glucose-Capillary 137 (*)    All other components within normal limits  CULTURE, BLOOD (ROUTINE X 2)  CULTURE, BLOOD (ROUTINE X 2)  CBC WITH DIFFERENTIAL/PLATELET  BASIC METABOLIC PANEL WITH GFR  CBC  I-STAT CG4 LACTIC ACID, ED  I-STAT CG4 LACTIC ACID, ED    EKG: None  Radiology: DG Foot Complete Right Result Date: 10/25/2023 CLINICAL DATA:  red swollen low grade temp since this am EXAM: RIGHT FOOT COMPLETE - 3+ VIEW COMPARISON:  October 08, 2014 FINDINGS: Osteopenia. Hallux valgus deformity. 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 Moderate degenerative changes of the midfoot. No acute fracture or dislocation. There is no evidence of arthropathy or other focal bone abnormality. Diffuse soft tissue swelling about the foot. No radiopaque foreign body. IMPRESSION: Diffuse soft tissue swelling about the foot. No radiographic findings of osteomyelitis. Electronically Signed   By: Rogelia Myers M.D.   On: 10/25/2023 17:41     Procedures   Medications Ordered in the ED  vancomycin  (VANCOREADY) IVPB 2000 mg/400 mL (has no administration in time range)  allopurinol  (ZYLOPRIM ) tablet 200 mg (has no administration in time range)  atorvastatin  (LIPITOR ) tablet 40 mg (has no administration in time range)  benazepril  (LOTENSIN ) tablet 15 mg (has no administration  in time range)  diltiazem  (CARDIZEM  CD) 24 hr capsule 180 mg (has no administration in time range)  dofetilide  (TIKOSYN ) capsule 500 mcg (has no administration in time range)  levothyroxine  (SYNTHROID ) tablet 112 mcg (has no administration in time range)  apixaban  (ELIQUIS ) tablet 5 mg (has no administration in time range)  gabapentin  (NEURONTIN ) capsule 100 mg (has no administration in time range)  acetaminophen  (TYLENOL ) tablet 650 mg (has no administration in time range)    Or  acetaminophen  (TYLENOL ) suppository 650 mg (has no administration in time range)  vancomycin  (VANCOREADY) IVPB 1500 mg/300 mL (has no administration in time range)  oxyCODONE  (Oxy IR/ROXICODONE ) immediate release tablet 5-10 mg (has no administration in time range)  traMADol  (ULTRAM ) tablet 50 mg (has no administration in time range)  ceFEPIme  (MAXIPIME ) 2 g in sodium chloride  0.9 % 100 mL IVPB (0 g Intravenous Stopped 10/25/23 1955)    Clinical Course as of 10/25/23 2213  Sun Oct 25, 2023  1828 HTN, OSA uses 2L O2 at night, hypothyroidism, DM II, stroke, atrial fibrillation, CAD s/p CABG, HFpEF [AG]    Clinical Course User Index [AG] Guiselle Mian, DO                                  Medical Decision Making Risk Prescription drug management. Decision regarding hospitalization.   On initial evaluation patient is hemodynamically stable, afebrile and not in acute distress.  Based upon patient's history and physical examination findings differential diagnosis include cellulitis, abscess, erysipelas, septic arthritis.  Patient's physical examination findings consistent with cellulitis.  Based upon patient's history of type 2 diabetes with associated physical examination findings with purulent drainage will start patient on IV antibiotics.  Patient's presentation less likely septic arthritis as patient is afebrile without leukocytosis.  Patient will be admitted to the hospital for continuation of IV antibiotics and observation of cellulitic right lower extremity.     Final diagnoses:  Cellulitis of right lower extremity    ED Discharge Orders     None       Lavanda Bolster DO Emergency Medicine PGY2    Bolster Lavanda, DO 10/25/23 2213    Yolande Lamar BROCKS, MD 10/29/23 [phone]

## 2023-10-25 NOTE — Progress Notes (Signed)
 Pharmacy Antibiotic Note  [name] is a 78 y.o. male admitted on 10/25/2023 with purulent cellulitis of the foot.  Pharmacy has been consulted for vancomycin  dosing.  Vancomycin  2g IV x 1 given in ED  Plan: Vancomycin  1500 mg IV q 24h (eAUC 449) Monitor renal function, Cx and clinical progression to narrow Vancomycin  levels as indicated   Height: 6' 3 (190.5 cm) Weight: 111.1 kg (244 lb 14.9 oz) IBW/kg (Calculated) : 84.5  Temp (24hrs), Avg:99.9 F (37.7 C), Min:99.9 F (37.7 C), Max:99.9 F (37.7 C)  Recent Labs  Lab 10/25/23 1653 10/25/23 1659 10/25/23 1910  WBC 9.3  --   --   CREATININE 1.05  --   --   LATICACIDVEN  --  1.1 1.1    Estimated Creatinine Clearance: 75.5 mL/min (by C-G formula based on SCr of 1.05 mg/dL).    No Known Allergies  Dorn Poot, PharmD, [facility] Clinical Pharmacist ED Pharmacist Phone # [phone] 10/25/2023 8:18 PM

## 2023-10-25 NOTE — ED Triage Notes (Addendum)
 Pt presents with right foot redness and swelling. States much worse since yesterday. Pt ambulating with walker with difficulty. Screened by Almarie Pizza PA-C in lobby due to this and advised to go to ED for treatment. Family present with pt to drive.

## 2023-10-25 NOTE — ED Provider Triage Note (Signed)
 Emergency Medicine Provider Triage Evaluation Note  [name] , a 78 y.o. male  was evaluated in triage.  Pt complains of right foot swelling and redness that started this morning.  He is not a diabetic.  No injury or bite to this area.  Review of Systems  Positive: As above Negative: As above  Physical Exam  BP (!) 156/83 (BP Location: Right Arm)   Pulse (!) 49   Temp 99.9 F (37.7 C) (Oral)   Resp 14   Ht 6' 3 (1.905 m)   Wt 111.1 kg   SpO2 95%   BMI 30.61 kg/m  Gen:   Awake, no distress   Resp:  Normal effort  MSK:   Moves extremities without difficulty  Other:    Medical Decision Making  Medically screening exam initiated at 5:04 PM.  Appropriate orders placed.  [name] was informed that the remainder of the evaluation will be completed by another provider, this initial triage assessment does not replace that evaluation, and the importance of remaining in the ED until their evaluation is complete.    Hildegard Loge, PA-C 10/25/23 1705

## 2023-10-25 NOTE — ED Provider Notes (Signed)
 78 y.o. male who presents to urgent care with complaints of right foot redness and pain.  He reports that yesterday he was having some difficulty walking on the foot and it was somewhat painful but when he woke up this morning it was significantly swollen and red.  It is hot to the touch.  Advised the patient that given this appearance recommend that he go to the emergency room for further evaluation as he will likely need more advanced imaging as well as lab work.  Patient and his wife understand and will go now.   [name] LABOR, [place] 10/25/23 1625

## 2023-10-25 NOTE — Discharge Instructions (Addendum)
 Concerning presentation of the right foot with significant worsening overnight.  This is concerning for cellulitis.  Given this presentation and the worsening overnight, recommend further evaluation in the emergency room where advanced imaging and lab work can be performed.

## 2023-10-25 NOTE — ED Triage Notes (Signed)
 The pt has a red and swollen rt foot especially across the toes   since this am  he is not a diabetic   he has had cellulitis to this foot previously   low grade temp

## 2023-10-25 NOTE — ED Notes (Signed)
 2nd lactic due @ 1859

## 2023-10-25 NOTE — ED Notes (Signed)
 Patient is being discharged from the Urgent Care and sent to the Emergency Department via POV . Per Almarie Pizza, PA-C, patient is in need of higher level of care due to wound/skin infection. Patient is aware and verbalizes understanding of plan of care. There were no vitals filed for this visit.

## 2023-10-26 ENCOUNTER — Inpatient Hospital Stay (HOSPITAL_COMMUNITY)

## 2023-10-26 DIAGNOSIS — L03115 Cellulitis of right lower limb: Secondary | ICD-10-CM | POA: Diagnosis not present

## 2023-10-26 LAB — CBC
HCT: 34.3 % — ABNORMAL LOW (ref 39.0–52.0)
Hemoglobin: 11.2 g/dL — ABNORMAL LOW (ref 13.0–17.0)
MCH: 31.1 pg (ref 26.0–34.0)
MCHC: 32.7 g/dL (ref 30.0–36.0)
MCV: 95.3 fL (ref 80.0–100.0)
Platelets: 145 K/uL — ABNORMAL LOW (ref 150–400)
RBC: 3.6 MIL/uL — ABNORMAL LOW (ref 4.22–5.81)
RDW: 12.6 % (ref 11.5–15.5)
WBC: 7.8 K/uL (ref 4.0–10.5)
nRBC: 0 % (ref 0.0–0.2)

## 2023-10-26 LAB — BASIC METABOLIC PANEL WITH GFR
Anion gap: 9 (ref 5–15)
BUN: 16 mg/dL (ref 8–23)
CO2: 26 mmol/L (ref 22–32)
Calcium: 8.7 mg/dL — ABNORMAL LOW (ref 8.9–10.3)
Chloride: 103 mmol/L (ref 98–111)
Creatinine, Ser: 0.96 mg/dL (ref 0.61–1.24)
GFR, Estimated: >60 mL/min (ref >60)
Glucose, Bld: 113 mg/dL — ABNORMAL HIGH (ref 70–99)
Potassium: 3.8 mmol/L (ref 3.5–5.1)
Sodium: 138 mmol/L (ref 135–145)

## 2023-10-26 MED ORDER — POTASSIUM CHLORIDE CRYS ER 20 MEQ PO TBCR
40.0000 meq | EXTENDED_RELEASE_TABLET | Freq: Once | ORAL | Status: AC
  Administered 2023-10-26 (×2): 40 meq via ORAL
  Filled 2023-10-26: qty 2

## 2023-10-26 MED ORDER — PIPERACILLIN-TAZOBACTAM 3.375 G IVPB 30 MIN
3.3750 g | Freq: Once | INTRAVENOUS | Status: AC
  Administered 2023-10-26 (×2): 3.375 g via INTRAVENOUS
  Filled 2023-10-26: qty 50

## 2023-10-26 MED ORDER — PIPERACILLIN-TAZOBACTAM 3.375 G IVPB
3.3750 g | Freq: Three times a day (TID) | INTRAVENOUS | Status: DC
  Administered 2023-10-26 – 2023-10-28 (×10): 3.375 g via INTRAVENOUS
  Filled 2023-10-26 (×5): qty 50

## 2023-10-26 NOTE — Plan of Care (Signed)

## 2023-10-26 NOTE — Progress Notes (Signed)
 Progress Note   Patient: [name] FMW:[id number] DOB: 1946/01/10 DOA: 10/25/2023     1 DOS: the patient was seen and examined on 10/26/2023   Brief hospital course: 78 year old man with PMH of HTN, T2DM, CAD s/p CABG, HFpEF, A-fib on Eliquis , OSA, hypothyroidism who presented with 2-day history of right foot redness, swelling, drainage.  Diagnosed with right hallux osteomyelitis and right foot cellulitis.  Assessment and Plan:  Right foot cellulitis Right hallux osteomyelitis Presented to the ED with right foot redness, swelling, drainage for 2 days. Started on cefepime , vancomycin  in the ED. MRI ordered and showed findings suspicious for osteomyelitis of the distal phalanx of the great toe, no evidence of septic arthritis or osteomyelitis of the proximal phalanx, soft tissue swelling and subcutaneous edema in the great toe. -Continue vancomycin  -Start Zosyn . - Podiatry consult  Atrial fibrillation On Eliquis , Tikosyn , diltiazem  at home. - Continue home Tikosyn , diltiazem . - Maintain potassium >4 in the setting of Tikosyn . - Holding home Eliquis  for now in case of need for surgical intervention.  CAD s/p CABG - Continue home Lipitor , Eliquis .  Hypertension - Continue home benazepril , diltiazem .  T2DM Diet controlled at home. - Diabetic diet. - SSI.  Hypothyroidism - Continue home Synthroid       Subjective: Patient is concerned that his foot is not improving.  MRI ordered and revealed osteomyelitis.  Physical Exam: Vitals:   10/25/23 2028 10/26/23 0004 10/26/23 0432 10/26/23 0811  BP: 121/86 (!) 143/69 (!) 142/73 119/63  Pulse: 91 82 73 69  Resp: 16 16 16 19   Temp: 98.9 F (37.2 C) 98.8 F (37.1 C) 98.2 F (36.8 C) 98.5 F (36.9 C)  TempSrc: Oral Oral    SpO2: 96% 96% 97% 98%  Weight:      Height:       Physical Exam   General: Alert, oriented X3  Eyes: Pupils equal, reactive  Oral cavity: moist mucous membranes  Head: Atraumatic, normocephalic   Neck: supple  Chest: clear to auscultation. No crackles, no wheezes  CVS: S1,S2 RRR. No murmurs  Abd: No distention, soft, non-tender. No masses palpable  Extr: Red, warm, swollen right foot Neurological: Grossly intact.    Data Reviewed:     Latest Ref Rng & Units 10/26/2023    3:16 AM 10/25/2023    4:53 PM 09/08/2023   12:40 PM  CBC  WBC 4.0 - 10.5 K/uL 7.8  9.3  5.5   Hemoglobin 13.0 - 17.0 g/dL 88.7  86.9  86.9   Hematocrit 39.0 - 52.0 % 34.3  40.5  39.0   Platelets 150 - 400 K/uL 145  172  125       Latest Ref Rng & Units 10/26/2023    3:16 AM 10/25/2023    4:53 PM 09/08/2023   12:40 PM  BMP  Glucose 70 - 99 mg/dL 886  870  98   BUN 8 - 23 mg/dL 16  13  17    Creatinine 0.61 - 1.24 mg/dL 9.03  8.94  9.27   Sodium 135 - 145 mmol/L 138  139  143   Potassium 3.5 - 5.1 mmol/L 3.8  4.3  4.0   Chloride 98 - 111 mmol/L 103  101  106   CO2 22 - 32 mmol/L 26  28  28    Calcium  8.9 - 10.3 mg/dL 8.7  9.3  9.0      Family Communication: Spoke with daughter and wife at bedside.  Disposition: Status is: Inpatient Remains inpatient appropriate  because: Being treated for lower extremity infection requiring IV antibiotics  Planned Discharge Destination: Home    Time spent: 50 minutes  Author: MDALA-GAUSI, [name] Mannis AGATHA, MD 10/26/2023 1:19 PM  For on call review [email or web address].

## 2023-10-26 NOTE — TOC CM/SW Note (Signed)
 Transition of Care [facility]) - Inpatient Brief Assessment   Patient Details  Name: [name] MRN: [id number] Date of Birth: 08/13/1945  Transition of Care [facility]) CM/SW Contact:    Lauraine FORBES Saa, LCSWA Phone Number: 10/26/2023, 9:03 AM   Clinical Narrative:  9:03 AM Per chart review, patient resides at home with spouse, child(ren), and other relative(s). Patient has a PCP and insurance. Patient does not have SNF history. Patient has CIR history with [facility]. Patient has HH history with [facility]. Patient has DME (rolling walker, oxygen  machine) history with [employer], [employer], and [facility]. Patient's preferred pharmacy is [facility] [phone] [facility]. No TOC needs were identified at this time. TOC will continue to follow and be available to assist.  Transition of Care Asessment: Insurance and Status: Insurance coverage has been reviewed Patient has primary care physician: No Home environment has been reviewed: Private Residence Prior level of function:: N/A Prior/Current Home Services: No current home services (Has HH/DME history) Social Drivers of Health Review: SDOH reviewed no interventions necessary Readmission risk has been reviewed: Yes (Currently Yellow 15%) Transition of care needs: no transition of care needs at this time

## 2023-10-26 NOTE — Progress Notes (Signed)
 Pharmacy Antibiotic Note  [name] is a 78 y.o. male admitted on 10/25/2023 with purulent cellulitis of the foot.  Pharmacy has been consulted for vancomycin  dosing.  Vancomycin  2g IV x 1 given in ED  Zosyn  added this PM to vanc to cover for osteo. Crcl>30  Plan: Zosyn  3.375g IV q8 Vancomycin  1500 mg IV q 24h (eAUC 449) Monitor renal function, Cx and clinical progression to narrow Vancomycin  levels as indicated   Height: 6' 3 (190.5 cm) Weight: 111.1 kg (244 lb 14.9 oz) IBW/kg (Calculated) : 84.5  Temp (24hrs), Avg:98.6 F (37 C), Min:98.2 F (36.8 C), Max:98.9 F (37.2 C)  Recent Labs  Lab 10/25/23 1653 10/25/23 1659 10/25/23 1910 10/26/23 0316  WBC 9.3  --   --  7.8  CREATININE 1.05  --   --  0.96  LATICACIDVEN  --  1.1 1.1  --     Estimated Creatinine Clearance: 82.6 mL/min (by C-G formula based on SCr of 0.96 mg/dL).    No Known Allergies   Sergio Batch, PharmD, BCIDP, AAHIVP, CPP Infectious Disease Pharmacist 10/26/2023 5:31 PM

## 2023-10-26 NOTE — Progress Notes (Signed)
Open to air

## 2023-10-26 NOTE — Progress Notes (Signed)
 Per MD Opyd. Ok to give Tikosyn  without EKG

## 2023-10-27 DIAGNOSIS — L03115 Cellulitis of right lower limb: Secondary | ICD-10-CM | POA: Diagnosis not present

## 2023-10-27 DIAGNOSIS — M869 Osteomyelitis, unspecified: Secondary | ICD-10-CM

## 2023-10-27 HISTORY — DX: Osteomyelitis, unspecified: M86.9

## 2023-10-27 LAB — CBC
HCT: 37.3 % — ABNORMAL LOW (ref 39.0–52.0)
Hemoglobin: 12.1 g/dL — ABNORMAL LOW (ref 13.0–17.0)
MCH: 30.8 pg (ref 26.0–34.0)
MCHC: 32.4 g/dL (ref 30.0–36.0)
MCV: 94.9 fL (ref 80.0–100.0)
Platelets: 147 K/uL — ABNORMAL LOW (ref 150–400)
RBC: 3.93 MIL/uL — ABNORMAL LOW (ref 4.22–5.81)
RDW: 12.6 % (ref 11.5–15.5)
WBC: 5.7 K/uL (ref 4.0–10.5)
nRBC: 0 % (ref 0.0–0.2)

## 2023-10-27 LAB — BASIC METABOLIC PANEL WITH GFR
Anion gap: 8 (ref 5–15)
BUN: 16 mg/dL (ref 8–23)
CO2: 28 mmol/L (ref 22–32)
Calcium: 9 mg/dL (ref 8.9–10.3)
Chloride: 102 mmol/L (ref 98–111)
Creatinine, Ser: 1.03 mg/dL (ref 0.61–1.24)
GFR, Estimated: >60 mL/min (ref >60)
Glucose, Bld: 101 mg/dL — ABNORMAL HIGH (ref 70–99)
Potassium: 4.2 mmol/L (ref 3.5–5.1)
Sodium: 138 mmol/L (ref 135–145)

## 2023-10-27 LAB — MAGNESIUM: Magnesium: 2.2 mg/dL (ref 1.7–2.4)

## 2023-10-27 NOTE — Plan of Care (Signed)
  Problem: Education: Goal: Knowledge of General Education information will improve Description: Including pain rating scale, medication(s)/side effects and non-pharmacologic comfort measures Outcome: Progressing   Problem: Health Behavior/Discharge Planning: Goal: Ability to manage health-related needs will improve Outcome: Progressing   Problem: Coping: Goal: Level of anxiety will decrease Outcome: Progressing   Problem: Elimination: Goal: Will not experience complications related to bowel motility Outcome: Progressing   Problem: Elimination: Goal: Will not experience complications related to urinary retention Outcome: Progressing   Problem: Pain Managment: Goal: General experience of comfort will improve and/or be controlled Outcome: Progressing   Problem: Safety: Goal: Ability to remain free from injury will improve Outcome: Progressing   Problem: Skin Integrity: Goal: Risk for impaired skin integrity will decrease Outcome: Progressing   Problem: Clinical Measurements: Goal: Ability to avoid or minimize complications of infection will improve Outcome: Progressing

## 2023-10-27 NOTE — Progress Notes (Addendum)
 Progress Note   Patient: [name] FMW:[id number] DOB: 11-14-1945 DOA: 10/25/2023     2 DOS: the patient was seen and examined on 10/27/2023   Brief hospital course: 78 year old man with PMH of HTN, T2DM, CAD s/p CABG, HFpEF, A-fib on Eliquis , OSA, hypothyroidism who presented with 2-day history of right foot redness, swelling, drainage.  Diagnosed with right hallux osteomyelitis and right foot cellulitis. Seen by Podiatry and scheduled for right hallux amputation.  Assessment and Plan:  Right foot cellulitis Right hallux osteomyelitis Presented to the ED with right foot redness, swelling, drainage for 2 days. Started on cefepime , vancomycin  in the ED. MRI ordered and showed findings suspicious for osteomyelitis of the distal phalanx of the great toe, no evidence of septic arthritis or osteomyelitis of the proximal phalanx, soft tissue swelling and subcutaneous edema in the great toe. Podiatry consulted.  Input appreciated. Podiatry plans to take patient to the OR on 8/13 for right hallux amputation. -Continue Zosyn , vancomycin  -Infectious disease consulted.  Atrial fibrillation On Eliquis , Tikosyn , diltiazem  at home. - Continue home Tikosyn , diltiazem . - Maintain potassium >4 in the setting of Tikosyn . - Holding home Eliquis  for surgical intervention.  CAD s/p CABG - Continue home Lipitor  - Holding Eliquis .  Hypertension - Continue home benazepril , diltiazem .  T2DM Diet controlled at home. - Diabetic diet. - SSI.  Hypothyroidism - Continue home Synthroid   Class I obesity Body mass index is 30.61 kg/m. - For outpatient management  Urinary incontinence Patient reports urinary incontinence for a few weeks. - Ambulatory referral to urology at discharge.      Subjective: Patient seen by podiatry.  Physical Exam: Vitals:   10/26/23 0811 10/26/23 2002 10/27/23 0500 10/27/23 0722  BP: 119/63 (!) 115/57 134/74 (!) 143/86  Pulse: 69 65 91 60  Resp: 19 18 18     Temp: 98.5 F (36.9 C) 97.8 F (36.6 C) 98 F (36.7 C) 97.8 F (36.6 C)  TempSrc:   Oral Oral  SpO2: 98% 98% 99% 99%  Weight:      Height:       Physical Exam   General: Alert, oriented X3  Eyes: Pupils equal, reactive  Oral cavity: moist mucous membranes  Head: Atraumatic, normocephalic  Neck: supple  Chest: clear to auscultation. No crackles, no wheezes  CVS: S1,S2 RRR. No murmurs  Abd: No distention, soft, non-tender. No masses palpable  Extr: Redness and swelling of right foot Neurological: Grossly intact.    Data Reviewed:     Latest Ref Rng & Units 10/27/2023    6:21 AM 10/26/2023    3:16 AM 10/25/2023    4:53 PM  CBC  WBC 4.0 - 10.5 K/uL 5.7  7.8  9.3   Hemoglobin 13.0 - 17.0 g/dL 87.8  88.7  86.9   Hematocrit 39.0 - 52.0 % 37.3  34.3  40.5   Platelets 150 - 400 K/uL 147  145  172       Latest Ref Rng & Units 10/27/2023    6:21 AM 10/26/2023    3:16 AM 10/25/2023    4:53 PM  BMP  Glucose 70 - 99 mg/dL 898  886  870   BUN 8 - 23 mg/dL 16  16  13    Creatinine 0.61 - 1.24 mg/dL 8.96  9.03  8.94   Sodium 135 - 145 mmol/L 138  138  139   Potassium 3.5 - 5.1 mmol/L 4.2  3.8  4.3   Chloride 98 - 111 mmol/L 102  103  101   CO2 22 - 32 mmol/L 28  26  28    Calcium  8.9 - 10.3 mg/dL 9.0  8.7  9.3      Family Communication: n/a  Disposition: Status is: Inpatient Remains inpatient appropriate because: Being treated for lower extremity infection requiring IV antibiotics  Planned Discharge Destination: Home    Time spent: 35 minutes  Author: MDALA-GAUSI, Nazirah Tri AGATHA, MD 10/27/2023 1:17 PM  For on call review [email or web address].

## 2023-10-27 NOTE — Consult Note (Signed)
 PODIATRY CONSULTATION  NAME [name] MRN [id number] DOB 12-24-1945 DOA 10/25/2023   Reason for consult:  Chief Complaint  Patient presents with   foot red and swollen    Attending/Consulting physician:   History of present illness: [name] is a 78 y.o. male with medical history significant for hypertension, diet-controlled diabetes mellitus, CAD status post CABG, chronic HFpEF, atrial fibrillation on Eliquis , OSA with CPAP intolerance, and hypothyroidism who presents with worsening right foot redness, swelling, and drainage.   Patient noticed that his right foot was sore yesterday.  This morning, he had severe right forefoot pain, erythema, and noted purulent drainage from the first toe.  He denies fevers, chills, or any other rash or wound.  He denies any recent chest pain.  Discussed with patient he is not sure how much drainage was from the toe but does report some at some point in time.  He has noticed increasing right foot redness swelling pain.  Past Medical History:  Diagnosis Date   Acute encephalopathy 12/12/2015   Acute respiratory failure with hypoxia and hypercapnia (HCC) 12/12/2015   Allergic rhinitis 02/13/2010   Qualifier: Diagnosis of  By: Georgian ROSALEA CHARM Lamar    Angina at rest [facility]) 10/22/2015   Arthritis    Ataxia 08/10/2013   Atrial fibrillation (HCC) 11/2018   B12 deficiency 08/11/2013   BACK PAIN, LUMBAR 01/29/2009   Qualifier: Diagnosis of  By: Georgian ROSALEA CHARM Lamar    BENIGN POSITIONAL VERTIGO 11/23/2009   Qualifier: Diagnosis of  By: Georgian ROSALEA CHARM Lamar    Chronic diastolic heart failure (HCC) 04/24/2015   Chronic pain    left sided-Kristeins   Chronically elevated hemidiaphragm    right   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/04/2015   s/p CABG 2004; CFX DES 2011   Elevated troponin    Essential hypertension 12/12/2006   Qualifier: Diagnosis of  By: Wilhemina RMA, Lucy     GERD 12/12/2006   Qualifier:  Diagnosis of  By: Wilhemina KRAFT, Lucy     GOUT 12/12/2006   Qualifier: Diagnosis of  By: Wilhemina RMA, Lucy     Hemorrhoids    Hyperlipidemia LDL goal <70 12/12/2006   Qualifier: Diagnosis of  By: Wilhemina RMA, Lucy      Hypogonadism male 04/10/2011   Hypothyroidism, acquired 02/15/2013   Insomnia 05/21/2013   Long term current use of anticoagulant therapy 06/24/2011   Myocardial infarction (HCC)    Obstructive sleep apnea-Failed CPAP 06/17/2007   Failed CPAP Using O 2 for sleep    On home oxygen  therapy    2L at night for sleep apnea   Paroxysmal atrial fibrillation (HCC) 12/06/2016   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; resultant hemiplegia of dominant side   Thoracic aorta atherosclerosis (HCC) 04/27/2017   Tubular adenoma of colon 05/2010   Vasovagal syncope    Vasovagal syncope    Wears glasses        Latest Ref Rng & Units 10/27/2023    6:21 AM 10/26/2023    3:16 AM 10/25/2023    4:53 PM  CBC  WBC 4.0 - 10.5 K/uL 5.7  7.8  9.3   Hemoglobin 13.0 - 17.0 g/dL 87.8  88.7  86.9   Hematocrit 39.0 - 52.0 % 37.3  34.3  40.5   Platelets 150 - 400 K/uL 147  145  172        Latest Ref Rng & Units 10/27/2023  6:21 AM 10/26/2023    3:16 AM 10/25/2023    4:53 PM  BMP  Glucose 70 - 99 mg/dL 898  886  870   BUN 8 - 23 mg/dL 16  16  13    Creatinine 0.61 - 1.24 mg/dL 8.96  9.03  8.94   Sodium 135 - 145 mmol/L 138  138  139   Potassium 3.5 - 5.1 mmol/L 4.2  3.8  4.3   Chloride 98 - 111 mmol/L 102  103  101   CO2 22 - 32 mmol/L 28  26  28    Calcium  8.9 - 10.3 mg/dL 9.0  8.7  9.3       Physical Exam: Lower Extremity Exam  Right foot DP and PT pulses 2+ palpable  Sensation diminished to light touch to right hallux  Attention directed to the right hallux there is significant erythema and edema especially at the distal aspect of the great toe.  Appears to be healed ulceration with eschar overlying at the distal plantar aspect of the right hallux.  Evidence of prior drainage  that is now dried surrounding the distal toe.   ASSESSMENT/PLAN OF CARE 78 y.o. male with PMHx significant for  HTN, T2DM, CAD s/p CABG, HFpEF, A-fib on Eliquis , OSA, hypothyroidism  with osteomyelitis of the distal phalanx right great toe with secondary cellulitis  MRI right foot: Attention directed to the distal phalanx concerning for marrow edema concerning for osteomyelitis of the distal phalanx no evidence of septic arthritis or osteomyelitis of the proximal phalanx.  - Discussed with patient I recommend amputation of the right hallux.  He is agreeable.  N.p.o. past midnight for OR tomorrow around noon for right hallux amputation likely through the proximal phalanx neck area. - Continue IV abx broad spectrum pending further culture data - Anticoagulation: Okay to continue per primary - Wound care: None needed preoperatively - WB status: Will be weightbearing as tolerated in postop shoe following the surgery - Will continue to follow   Thank you for the consult.  Please contact me directly with any questions or concerns.           Marolyn JULIANNA Honour, DPM [facility]    [address] [place], [place] [address]                Office [phone]  Fax [phone]

## 2023-10-28 ENCOUNTER — Inpatient Hospital Stay (HOSPITAL_COMMUNITY): Admitting: Anesthesiology

## 2023-10-28 ENCOUNTER — Encounter (HOSPITAL_COMMUNITY)
Admission: EM | Disposition: A | Discharge status: Home / Self Care | Payer: Self-pay | Source: Home / Self Care | Attending: Family Medicine

## 2023-10-28 ENCOUNTER — Other Ambulatory Visit: Payer: Self-pay

## 2023-10-28 ENCOUNTER — Inpatient Hospital Stay (HOSPITAL_COMMUNITY)

## 2023-10-28 ENCOUNTER — Encounter (HOSPITAL_COMMUNITY): Payer: Self-pay | Admitting: Family Medicine

## 2023-10-28 DIAGNOSIS — E1169 Type 2 diabetes mellitus with other specified complication: Secondary | ICD-10-CM | POA: Diagnosis not present

## 2023-10-28 DIAGNOSIS — M869 Osteomyelitis, unspecified: Secondary | ICD-10-CM

## 2023-10-28 DIAGNOSIS — I2510 Atherosclerotic heart disease of native coronary artery without angina pectoris: Secondary | ICD-10-CM

## 2023-10-28 DIAGNOSIS — I252 Old myocardial infarction: Secondary | ICD-10-CM | POA: Diagnosis not present

## 2023-10-28 DIAGNOSIS — L03115 Cellulitis of right lower limb: Secondary | ICD-10-CM | POA: Diagnosis not present

## 2023-10-28 HISTORY — PX: AMPUTATION TOE: SHX6595

## 2023-10-28 LAB — BASIC METABOLIC PANEL WITH GFR
Anion gap: 7 (ref 5–15)
BUN: 19 mg/dL (ref 8–23)
CO2: 29 mmol/L (ref 22–32)
Calcium: 9.2 mg/dL (ref 8.9–10.3)
Chloride: 105 mmol/L (ref 98–111)
Creatinine, Ser: 1.23 mg/dL (ref 0.61–1.24)
GFR, Estimated: 59 mL/min — ABNORMAL LOW (ref >60)
Glucose, Bld: 116 mg/dL — ABNORMAL HIGH (ref 70–99)
Potassium: 3.8 mmol/L (ref 3.5–5.1)
Sodium: 141 mmol/L (ref 135–145)

## 2023-10-28 LAB — GLUCOSE, CAPILLARY
Glucose-Capillary: 124 mg/dL — ABNORMAL HIGH (ref 70–99)
Glucose-Capillary: 109 mg/dL — ABNORMAL HIGH (ref 70–99)
Glucose-Capillary: 166 mg/dL — ABNORMAL HIGH (ref 70–99)
Glucose-Capillary: 121 mg/dL — ABNORMAL HIGH (ref 70–99)

## 2023-10-28 LAB — CBC
HCT: 37.1 % — ABNORMAL LOW (ref 39.0–52.0)
Hemoglobin: 12.2 g/dL — ABNORMAL LOW (ref 13.0–17.0)
MCH: 31 pg (ref 26.0–34.0)
MCHC: 32.9 g/dL (ref 30.0–36.0)
MCV: 94.4 fL (ref 80.0–100.0)
Platelets: 168 K/uL (ref 150–400)
RBC: 3.93 MIL/uL — ABNORMAL LOW (ref 4.22–5.81)
RDW: 12.3 % (ref 11.5–15.5)
WBC: 5.6 K/uL (ref 4.0–10.5)
nRBC: 0 % (ref 0.0–0.2)

## 2023-10-28 SURGERY — AMPUTATION, TOE
Anesthesia: Monitor Anesthesia Care | Site: Toe | Laterality: Right

## 2023-10-28 MED ORDER — LIDOCAINE HCL (PF) 1 % IJ SOLN
INTRAMUSCULAR | Status: DC | PRN
Start: 2023-10-28 — End: 2023-10-28
  Administered 2023-10-28 (×2): 5 mL

## 2023-10-28 MED ORDER — FENTANYL CITRATE (PF) 250 MCG/5ML IJ SOLN
INTRAMUSCULAR | Status: DC | PRN
  Administered 2023-10-28 (×2): 50 ug via INTRAVENOUS

## 2023-10-28 MED ORDER — CHLORHEXIDINE GLUCONATE 0.12 % MT SOLN
OROMUCOSAL | Status: AC
  Administered 2023-10-28 (×2): 15 mL via OROMUCOSAL
  Filled 2023-10-28: qty 15

## 2023-10-28 MED ORDER — ACETAMINOPHEN 500 MG PO TABS
ORAL_TABLET | ORAL | Status: AC
  Filled 2023-10-28: qty 2

## 2023-10-28 MED ORDER — OXYCODONE HCL 5 MG/5ML PO SOLN: 5.0000 mg | Freq: Once | ORAL | Status: DC | PRN

## 2023-10-28 MED ORDER — PHENYLEPHRINE HCL-NACL 20-0.9 MG/250ML-% IV SOLN
INTRAVENOUS | Status: DC | PRN
  Administered 2023-10-28: 160 ug via INTRAVENOUS
  Administered 2023-10-28: 80 ug via INTRAVENOUS
  Administered 2023-10-28: 160 ug via INTRAVENOUS
  Administered 2023-10-28: 80 ug via INTRAVENOUS

## 2023-10-28 MED ORDER — PROPOFOL 10 MG/ML IV BOLUS
INTRAVENOUS | Status: DC | PRN
  Administered 2023-10-28 (×2): 20 mg via INTRAVENOUS

## 2023-10-28 MED ORDER — LIDOCAINE 2% (20 MG/ML) 5 ML SYRINGE
INTRAMUSCULAR | Status: DC | PRN
  Administered 2023-10-28 (×2): 100 mg via INTRAVENOUS

## 2023-10-28 MED ORDER — VANCOMYCIN HCL 500 MG IV SOLR
INTRAVENOUS | Status: AC
  Filled 2023-10-28: qty 10

## 2023-10-28 MED ORDER — ACETAMINOPHEN 500 MG PO TABS
1000.0000 mg | ORAL_TABLET | Freq: Once | ORAL | Status: AC
  Administered 2023-10-28 (×2): 1000 mg via ORAL

## 2023-10-28 MED ORDER — ONDANSETRON HCL 4 MG/2ML IJ SOLN
INTRAMUSCULAR | Status: DC | PRN
  Administered 2023-10-28 (×2): 4 mg via INTRAVENOUS

## 2023-10-28 MED ORDER — AMOXICILLIN-POT CLAVULANATE 875-125 MG PO TABS
1.0000 | ORAL_TABLET | Freq: Two times a day (BID) | ORAL | Status: DC
  Administered 2023-10-28 – 2023-10-30 (×7): 1 via ORAL
  Filled 2023-10-28 (×5): qty 1

## 2023-10-28 MED ORDER — LIDOCAINE HCL (PF) 1 % IJ SOLN
INTRAMUSCULAR | Status: AC
  Filled 2023-10-28: qty 30

## 2023-10-28 MED ORDER — PROPOFOL 500 MG/50ML IV EMUL
INTRAVENOUS | Status: DC | PRN
  Administered 2023-10-28 (×2): 80 ug/kg/min via INTRAVENOUS

## 2023-10-28 MED ORDER — PROPOFOL 1000 MG/100ML IV EMUL
INTRAVENOUS | Status: AC
  Filled 2023-10-28: qty 100

## 2023-10-28 MED ORDER — ONDANSETRON HCL 4 MG/2ML IJ SOLN: 4.0000 mg | Freq: Once | INTRAMUSCULAR | Status: DC | PRN

## 2023-10-28 MED ORDER — FENTANYL CITRATE (PF) 100 MCG/2ML IJ SOLN: 25.0000 ug | INTRAMUSCULAR | Status: DC | PRN

## 2023-10-28 MED ORDER — TOBRAMYCIN SULFATE 1.2 G IJ SOLR
INTRAMUSCULAR | Status: AC
  Filled 2023-10-28: qty 1.2

## 2023-10-28 MED ORDER — BUPIVACAINE HCL (PF) 0.5 % IJ SOLN
INTRAMUSCULAR | Status: DC | PRN
Start: 2023-10-28 — End: 2023-10-28
  Administered 2023-10-28 (×2): 5 mL

## 2023-10-28 MED ORDER — ORAL CARE MOUTH RINSE: 15.0000 mL | Freq: Once | OROMUCOSAL | Status: AC

## 2023-10-28 MED ORDER — BUPIVACAINE HCL (PF) 0.5 % IJ SOLN
INTRAMUSCULAR | Status: AC
  Filled 2023-10-28: qty 30

## 2023-10-28 MED ORDER — LACTATED RINGERS IV SOLN: INTRAVENOUS | Status: DC

## 2023-10-28 MED ORDER — OXYCODONE HCL 5 MG PO TABS: 5.0000 mg | ORAL_TABLET | Freq: Once | ORAL | Status: DC | PRN

## 2023-10-28 MED ORDER — FENTANYL CITRATE (PF) 250 MCG/5ML IJ SOLN
INTRAMUSCULAR | Status: AC
Start: 2023-10-28 — End: 2023-10-28
  Filled 2023-10-28: qty 5

## 2023-10-28 MED ORDER — POTASSIUM CHLORIDE CRYS ER 20 MEQ PO TBCR
40.0000 meq | EXTENDED_RELEASE_TABLET | Freq: Once | ORAL | Status: AC
  Administered 2023-10-28 (×2): 40 meq via ORAL
  Filled 2023-10-28: qty 2

## 2023-10-28 MED ORDER — CHLORHEXIDINE GLUCONATE 0.12 % MT SOLN: 15.0000 mL | Freq: Once | OROMUCOSAL | Status: AC

## 2023-10-28 MED ORDER — PROPOFOL 10 MG/ML IV BOLUS
INTRAVENOUS | Status: AC
  Filled 2023-10-28: qty 20

## 2023-10-28 SURGICAL SUPPLY — 27 items
BLADE AVERAGE 25X9 (BLADE) IMPLANT
BLADE SURG 10 STRL SS (BLADE) ×2 IMPLANT
BLADE SURG 15 STRL LF DISP TIS (BLADE) ×2 IMPLANT
BNDG COHESIVE 3X5 TAN ST LF (GAUZE/BANDAGES/DRESSINGS) ×2 IMPLANT
BNDG COMPR ESMARK 4X3 LF (GAUZE/BANDAGES/DRESSINGS) ×2 IMPLANT
BNDG ELASTIC 3INX 5YD STR LF (GAUZE/BANDAGES/DRESSINGS) ×2 IMPLANT
BNDG ELASTIC 4X5.8 VLCR NS LF (GAUZE/BANDAGES/DRESSINGS) IMPLANT
BNDG GAUZE DERMACEA FLUFF 4 (GAUZE/BANDAGES/DRESSINGS) IMPLANT
DRSG XEROFORM 1X8 (GAUZE/BANDAGES/DRESSINGS) IMPLANT
ELECTRODE REM PT RTRN 9FT ADLT (ELECTROSURGICAL) ×2 IMPLANT
GAUZE SPONGE 4X4 12PLY STRL (GAUZE/BANDAGES/DRESSINGS) ×2 IMPLANT
GAUZE STRETCH 2X75IN STRL (MISCELLANEOUS) ×2 IMPLANT
GAUZE XEROFORM 1X8 LF (GAUZE/BANDAGES/DRESSINGS) ×2 IMPLANT
GLOVE BIO SURGEON STRL SZ7.5 (GLOVE) ×2 IMPLANT
GLOVE BIOGEL PI IND STRL 7.5 (GLOVE) ×2 IMPLANT
GOWN STRL REUS W/ TWL LRG LVL3 (GOWN DISPOSABLE) ×4 IMPLANT
KIT BASIN OR (CUSTOM PROCEDURE TRAY) ×2 IMPLANT
NDL HYPO 25X1 1.5 SAFETY (NEEDLE) ×2 IMPLANT
NEEDLE HYPO 25X1 1.5 SAFETY (NEEDLE) ×1 IMPLANT
PACK ORTHO EXTREMITY (CUSTOM PROCEDURE TRAY) ×2 IMPLANT
PADDING CAST ABS COTTON 4X4 ST (CAST SUPPLIES) ×4 IMPLANT
SUT PROLENE 3 0 PS 2 (SUTURE) IMPLANT
SYR CONTROL 10ML LL (SYRINGE) ×2 IMPLANT
TUBE CONNECTING 12X1/4 (SUCTIONS) IMPLANT
UNDERPAD 30X36 HEAVY ABSORB (UNDERPADS AND DIAPERS) ×2 IMPLANT
WATER STERILE IRR 1000ML POUR (IV SOLUTION) ×2 IMPLANT
YANKAUER SUCT BULB TIP NO VENT (SUCTIONS) IMPLANT

## 2023-10-28 NOTE — Transfer of Care (Signed)
 Immediate Anesthesia Transfer of Care Note  Patient: [name]  Procedure(s) Performed: AMPUTATION, TOE (Right: Toe)  Patient Location: PACU  Anesthesia Type:MAC  Level of Consciousness: awake, alert , and oriented  Airway & Oxygen  Therapy: Patient Spontanous Breathing  Post-op Assessment: Report given to RN, Post -op Vital signs reviewed and stable, and Patient moving all extremities  Post vital signs: Reviewed and stable  Last Vitals:  Vitals Value Taken Time  BP 110/54 10/28/23 12:42  Temp    Pulse 71 10/28/23 12:44  Resp 19 10/28/23 12:44  SpO2 96 % 10/28/23 12:44  Vitals shown include unfiled device data.  Last Pain:  Vitals:   10/28/23 1157  TempSrc:   PainSc: 0-No pain         Complications: No notable events documented.

## 2023-10-28 NOTE — Anesthesia Preprocedure Evaluation (Addendum)
 Anesthesia Evaluation  Patient identified by MRN, date of birth, ID band Patient awake    Reviewed: Allergy & Precautions, NPO status , Patient's Chart, lab work & pertinent test results  History of Anesthesia Complications Negative for: history of anesthetic complications  Airway Mallampati: II  TM Distance: >3 FB Neck ROM: Full    Dental  (+) Dental Advisory Given, Implants   Pulmonary sleep apnea and Oxygen  sleep apnea    Pulmonary exam normal        Cardiovascular hypertension, Pt. on medications + CAD, + Past MI, + Cardiac Stents and + CABG  Normal cardiovascular exam+ dysrhythmias Atrial Fibrillation    '19 Cath - 1. Left dominant circulation 2. Severe 3 vessel occlusive CAD.    - 95% ostial LAD    - 100% first diagonal    - 100% ostial LCx. This is new since 2017 - was 95% prior    - 100% nondominant RCA 3. Patent LIMA to the LAD. There is chronic severe disease in the apical LAD 4. Patent SVG to the first diagonal and SVG to the first OM as a Y graft. 5. Chronic occlusion of the SVG to terminal LCx 6. Normal LVEDP    Neuro/Psych  BPPV   Neuromuscular disease CVA, Residual Symptoms  negative psych ROS   GI/Hepatic Neg liver ROS,GERD  Medicated and Controlled,,  Endo/Other  diabetes, Type 2, Oral Hypoglycemic AgentsHypothyroidism   Obesity   Renal/GU negative Renal ROS     Musculoskeletal  (+) Arthritis ,    Abdominal   Peds  Hematology  (+) Blood dyscrasia, anemia  On eliquis     Anesthesia Other Findings Chronic pain   Reproductive/Obstetrics                              Anesthesia Physical Anesthesia Plan  ASA: 4  Anesthesia Plan: MAC   Post-op Pain Management: Tylenol  PO (pre-op)*   Induction:   PONV Risk Score and Plan: 1 and Propofol  infusion and Treatment may vary due to age or medical condition  Airway Management Planned: Natural Airway and Simple Face  Mask  Additional Equipment: None  Intra-op Plan:   Post-operative Plan:   Informed Consent: I have reviewed the patients History and Physical, chart, labs and discussed the procedure including the risks, benefits and alternatives for the proposed anesthesia with the patient or authorized representative who has indicated his/her understanding and acceptance.       Plan Discussed with: CRNA and Anesthesiologist  Anesthesia Plan Comments:          Anesthesia Quick Evaluation

## 2023-10-28 NOTE — Progress Notes (Signed)
 Orthopedic Tech Progress Note Patient Details:  [name] 03-Jul-1945 [id number]  Ortho Devices Type of Ortho Device: Postop shoe/boot Ortho Device/Splint Location: RLE Ortho Device/Splint Interventions: Ordered   Post Interventions Patient Tolerated: Well Instructions Provided: Care of device  [name] 10/28/2023, 1:37 PM

## 2023-10-28 NOTE — Plan of Care (Signed)
  Problem: Education: Goal: Knowledge of General Education information will improve Description: Including pain rating scale, medication(s)/side effects and non-pharmacologic comfort measures 10/28/2023 1920 by Lang Saucier, RN Outcome: Progressing 10/28/2023 1920 by Lang Saucier, RN Outcome: Progressing   Problem: Health Behavior/Discharge Planning: Goal: Ability to manage health-related needs will improve 10/28/2023 1920 by Lang Saucier, RN Outcome: Progressing 10/28/2023 1920 by Lang Saucier, RN Outcome: Progressing   Problem: Clinical Measurements: Goal: Ability to maintain clinical measurements within normal limits will improve 10/28/2023 1920 by Lang Saucier, RN Outcome: Progressing 10/28/2023 1920 by Lang Saucier, RN Outcome: Progressing Goal: Will remain free from infection 10/28/2023 1920 by Lang Saucier, RN Outcome: Progressing 10/28/2023 1920 by Lang Saucier, RN Outcome: Progressing Goal: Diagnostic test results will improve 10/28/2023 1920 by Lang Saucier, RN Outcome: Progressing 10/28/2023 1920 by Lang Saucier, RN Outcome: Progressing Goal: Respiratory complications will improve 10/28/2023 1920 by Lang Saucier, RN Outcome: Progressing 10/28/2023 1920 by Lang Saucier, RN Outcome: Progressing Goal: Cardiovascular complication will be avoided 10/28/2023 1920 by Lang Saucier, RN Outcome: Progressing 10/28/2023 1920 by Lang Saucier, RN Outcome: Progressing   Problem: Activity: Goal: Risk for activity intolerance will decrease 10/28/2023 1920 by Lang Saucier, RN Outcome: Progressing 10/28/2023 1920 by Lang Saucier, RN Outcome: Progressing   Problem: Nutrition: Goal: Adequate nutrition will be maintained 10/28/2023 1920 by Lang Saucier, RN Outcome: Progressing 10/28/2023 1920 by Lang Saucier, RN Outcome: Progressing   Problem:  Coping: Goal: Level of anxiety will decrease 10/28/2023 1920 by Lang Saucier, RN Outcome: Progressing 10/28/2023 1920 by Lang Saucier, RN Outcome: Progressing   Problem: Elimination: Goal: Will not experience complications related to bowel motility 10/28/2023 1920 by Lang Saucier, RN Outcome: Progressing 10/28/2023 1920 by Lang Saucier, RN Outcome: Progressing Goal: Will not experience complications related to urinary retention 10/28/2023 1920 by Lang Saucier, RN Outcome: Progressing 10/28/2023 1920 by Lang Saucier, RN Outcome: Progressing   Problem: Pain Managment: Goal: General experience of comfort will improve and/or be controlled 10/28/2023 1920 by Lang Saucier, RN Outcome: Progressing 10/28/2023 1920 by Lang Saucier, RN Outcome: Progressing   Problem: Safety: Goal: Ability to remain free from injury will improve 10/28/2023 1920 by Lang Saucier, RN Outcome: Progressing 10/28/2023 1920 by Lang Saucier, RN Outcome: Progressing   Problem: Skin Integrity: Goal: Risk for impaired skin integrity will decrease 10/28/2023 1920 by Lang Saucier, RN Outcome: Progressing 10/28/2023 1920 by Lang Saucier, RN Outcome: Progressing   Problem: Clinical Measurements: Goal: Ability to avoid or minimize complications of infection will improve 10/28/2023 1920 by Lang Saucier, RN Outcome: Progressing 10/28/2023 1920 by Lang Saucier, RN Outcome: Progressing   Problem: Skin Integrity: Goal: Skin integrity will improve 10/28/2023 1920 by Lang Saucier, RN Outcome: Progressing 10/28/2023 1920 by Lang Saucier, RN Outcome: Progressing

## 2023-10-28 NOTE — Plan of Care (Signed)

## 2023-10-28 NOTE — Anesthesia Postprocedure Evaluation (Signed)
 Anesthesia Post Note  Patient: [name]  Procedure(s) Performed: AMPUTATION, TOE (Right: Toe)     Patient location during evaluation: PACU Anesthesia Type: MAC Level of consciousness: awake and alert Pain management: pain level controlled Vital Signs Assessment: post-procedure vital signs reviewed and stable Respiratory status: spontaneous breathing, nonlabored ventilation and respiratory function stable Cardiovascular status: stable and blood pressure returned to baseline Anesthetic complications: no   No notable events documented.  Last Vitals:  Vitals:   10/28/23 1300 10/28/23 1315  BP: 115/62 117/68  Pulse: 60 (!) 58  Resp: 16 17  Temp: 36.5 C   SpO2: 93% 95%    Last Pain:  Vitals:   10/28/23 1300  TempSrc:   PainSc: 0-No pain                 Debby FORBES Like

## 2023-10-28 NOTE — Progress Notes (Signed)
 TR[facility]   ROUNDING   NOTE JEREN DUFRANE FMW:[id number]  DOB: [birth year]  DOA: 10/25/2023  PCP: Daryl Setter, NP  10/28/2023,8:03 AM  LOS: 3 days    Code Status:   full code     From: Home current Dispo: Unclear    78 year old white male Paroxysmal A-fib CHA2DS2-VASc >6 on Eliquis  CAD HFpEF last EF 2019 55-60% grade 1 DD at that time Previous ischemic CVA, vasovagal syncope previously OHSS/OSA failed CPAP with chronic hypoxia hypercapnia HTN HLD Gout Diet controlled DM TY 2 Recent surgery on chronic lesion of his upper back by Dr. Polly 09/08/2023 Chronic pain follows with Dr. Carilyn for spasticity gets Botox   Painful onychomycosis through nails 1 through 5 bilaterally follows with Dr. Norleen Binder podiatry  Came to ED 10/25/2023 worsening right foot redness swelling drainage purulent discharge right first toe Radiographs notable for diffuse soft tissue swelling  MRI showed suspicion for osteomyelitis distal phalanx great toe no septic arthritis and podiatry planned surgery 8/13  Plan  osteomyelitis right great hallux status post amputation right great through through proximal phalanx neck BC X2 pending since 8/10 Continues for now on Augmentin  1 tablet twice daily duration to be determined as per podiatrist-deep wound cultures taken 8/13 in OR Oxy IR 5-10 every 4 as needed severe pain, tramadol  50 every 6 as needed moderate pain, Tylenol  650 Continue gabapentin  100 at bedtime Eliquis  to be resumed when okayed by podiatry  Prediabetes A1c 09/08/2023 5.8 CBGs ranging 110-140-was not on insulin  prior to admission Will inquire  Paroxysmal A-fib CHA2DS2-VASc >6 on Eliquis  and Tikosyn  Is maintained on Tikosyn  500 mcg twice daily continue Cardizem  180 daily XL Keep on monitors temporarily overnight and reassess in a.m.  Prior ischemic CVA \ Prior to admission was on Eliquis  5 twice daily will resume  CAD HFpEF last echo 55-6% Lasix  40 as needed on hold, Lotensin  now at 15 mg  daily  OSA failed CPAP with chr hypercapnia  Needs outpatient reeval  Gout Seems episodic not on any chronic therapy  Chronic pain follows with Dr. Carilyn for spasticity Can follow as an outpatient with Dr. Carilyn for Botox  injections  Hypothyroid- Continue Synthroid  112 every morning  Mild thrombocytopenia Resolving needs outpatient workup    Data Reviewed:   Sodium 141 potassium 3.8 chloride 105 BUN/creatinine 19/1.2 WBC 5.6 hemoglobin 12.2 platelet 168  DVT prophylaxis: SCD for right now  Status is: Inpatient Remains inpatient appropriate because:   Await mobility and medical stability     Subjective: Awaken from sleep seem to have had a full lunch pain seems controlled he feels numb in the leg No chest pain no fever His main complaint is that he is passing a lot of urine but no burning-I explained to him that this may just be result of overall diuresis    Objective + exam Vitals:   10/27/23 0722 10/27/23 1555 10/27/23 2031 10/28/23 0420  BP: (!) 143/86 (!) 128/59 136/73 105/83  Pulse: 60 65 69 62  Resp:   18 18  Temp: 97.8 F (36.6 C) 98 F (36.7 C) 98.9 F (37.2 C) 98.2 F (36.8 C)  TempSrc: Oral  Oral Oral  SpO2: 99% 95% 94% 99%  Weight:      Height:       Filed Weights   10/25/23 1641  Weight: 111.1 kg     Examination: EOMI NCAT no icterus no pallor no wheeze rales rhonchi on exam Chest is clear anteriorly S1-S2 seems to be in sinus  Abdomen soft no rebound no guarding ROM is intact his right leg is bandaged and reinforced dressing on the right No lower extremity edema He does have some myofascial twisting to the left mentation is a little bit slow     Scheduled Meds:  allopurinol   200 mg Oral Daily   atorvastatin   40 mg Oral Daily   benazepril   15 mg Oral Daily   diltiazem   180 mg Oral Daily   dofetilide   500 mcg Oral BID   gabapentin   100 mg Oral QHS   levothyroxine   112 mcg Oral QAC breakfast   Continuous Infusions:   piperacillin -tazobactam (ZOSYN )  IV 3.375 g (10/27/23 2307)   vancomycin  1,500 mg (10/27/23 2244)    Time 26  Colen Grimes, MD  [facility]

## 2023-10-28 NOTE — Op Note (Signed)
 Full Operative Report  Date of Operation: 12:07 PM, 10/28/2023   Patient: [name] Reason - 78 y.o. male  Surgeon: Malvin Marsa [name], DPM   Assistant: None  Diagnosis: Osteomyelitis of right great toe  Procedure:  1. Amputation of right great toe through proximal phalanx neck    Anesthesia: Monitor Anesthesia Care  No responsible provider has been recorded for the case.  Anesthesiologist: Lucious Debby BRAVO, MD CRNA: Jerl Donald LABOR, CRNA   Estimated Blood Loss: Minimal   Hemostasis: 1) Anatomical dissection, mechanical compression, electrocautery 2) No tourniquet was used during the procedure  Implants: * No implants in log *  Materials: prolene 3-0  Injectables: 1) Pre-operatively: 10 cc of 50:50 mixture 1%lidocaine  plain and 0.5% marcaine  plain 2) Post-operatively: None   Specimens: - Pathology: Right hallux for pathology - Microbiology: bone culture R distal phalanx for culture   Antibiotics: IV antibiotics given per schedule on the floor  Drains: None  Complications: Patient tolerated the procedure well without complication.   Operative findings: As below in detailed report  Indications for Procedure: [name] presents to Malvin Marsa [name], DPM with a chief complaint of draining ulcer redness and swelling of right great toe, MRI with concern for osteomyelitis of the right hallux. The patient has failed conservative treatments of various modalities. At this time the patient has elected to proceed with surgical correction. All alternatives, risks, and complications of the procedures were thoroughly explained to the patient. Patient exhibits appropriate understanding of all discussion points and informed consent was signed and obtained in the chart with no guarantees to surgical outcome given or implied.  Description of Procedure: Patient was brought to the operating room. Patient remained on their hospital bed in the supine position. A surgical  timeout was performed and all members of the operating room, the procedure, and the surgical site were identified. anesthesia occurred as per anesthesia record. Local anesthetic as previously described was then injected about the operative field in a local infiltrative block.  The operative lower extremity as noted above was then prepped and draped in the usual sterile manner. The following procedure then began.  Attention was directed to the first digit on the RIGHT foot. A full-thickness incision encompassing the entire digit was made using a #15 blade. Dissection was carried down to bone. The toe was secured with a towel clamp, further dissected in its entirety, and disarticulated at the hallux IPJ and passed to the back table as a gross specimen. The head of the proximal phalanx was then resected and sent for path as well. A bone culture was harvested from the distal phalanx with rongeur.  This was then labled and sent to pathology. The bone was noted to be soft and eroded, and consistent with osteomyelitis. All remaining necrotic and devitalized soft tissue structures were visualized and dissected away using sharp and dull dissection. Care was taken to protect all neurovascular structures throughout the dissection. All bleeders were cauterized as necessary. . The area was then flushed with copious amounts of sterile saline. Then using the suture materials previously described, the site was closed in anatomic layers and the skin was well approximated under minimal tension.   The surgical site was then dressed with xeroform 4x4 kerlix and ace wrap. The patient tolerated both the procedure and anesthesia well with vital signs stable throughout. The patient was transferred in good condition and all vital signs stable  from the OR to recovery under the discretion of anesthesia.  Condition: Vital  signs stable, neurovascular status unchanged from preoperative   Surgical plan:  WBAT in post op shoe, expect  clean margin. Should be ok for Dc tomorrow from my standpoint. Plan for 5 days augmentin  on DC. Dressing to remain c/d/I until follow up.  The patient will be WBAT in a post op shoe to the operative limb until further instructed. The dressing is to remain clean, dry, and intact. Will continue to follow unless noted elsewhere.   Marsa Honour, DPM [facility]

## 2023-10-28 NOTE — Progress Notes (Signed)
 History and Physical Interval Note:  10/28/2023 11:36 AM  [name] Reason  has presented today for surgery, with the diagnosis of right great toe osteomyelitis.  The various methods of treatment have been discussed with the patient and family. After consideration of risks, benefits and other options for treatment, the patient has consented to   Procedure(s) with comments: AMPUTATION, TOE (Right) - R great toe amp partial vs total as a surgical intervention.  The patient's history has been reviewed, patient examined, no change in status, stable for surgery.  I have reviewed the patient's chart and labs.  Questions were answered to the patient's satisfaction.     [name]

## 2023-10-29 ENCOUNTER — Encounter (HOSPITAL_COMMUNITY): Payer: Self-pay | Admitting: Podiatry

## 2023-10-29 DIAGNOSIS — L03115 Cellulitis of right lower limb: Secondary | ICD-10-CM | POA: Diagnosis not present

## 2023-10-29 DIAGNOSIS — M869 Osteomyelitis, unspecified: Secondary | ICD-10-CM | POA: Diagnosis not present

## 2023-10-29 LAB — CBC
HCT: 37.6 % — ABNORMAL LOW (ref 39.0–52.0)
Hemoglobin: 12.4 g/dL — ABNORMAL LOW (ref 13.0–17.0)
MCH: 31.4 pg (ref 26.0–34.0)
MCHC: 33 g/dL (ref 30.0–36.0)
MCV: 95.2 fL (ref 80.0–100.0)
Platelets: 180 K/uL (ref 150–400)
RBC: 3.95 MIL/uL — ABNORMAL LOW (ref 4.22–5.81)
RDW: 12.4 % (ref 11.5–15.5)
WBC: 6.6 K/uL (ref 4.0–10.5)
nRBC: 0 % (ref 0.0–0.2)

## 2023-10-29 LAB — GLUCOSE, CAPILLARY
Glucose-Capillary: 123 mg/dL — ABNORMAL HIGH (ref 70–99)
Glucose-Capillary: 106 mg/dL — ABNORMAL HIGH (ref 70–99)
Glucose-Capillary: 151 mg/dL — ABNORMAL HIGH (ref 70–99)
Glucose-Capillary: 95 mg/dL (ref 70–99)

## 2023-10-29 LAB — BASIC METABOLIC PANEL WITH GFR
Anion gap: 6 (ref 5–15)
BUN: 18 mg/dL (ref 8–23)
CO2: 28 mmol/L (ref 22–32)
Calcium: 9.4 mg/dL (ref 8.9–10.3)
Chloride: 107 mmol/L (ref 98–111)
Creatinine, Ser: 0.99 mg/dL (ref 0.61–1.24)
GFR, Estimated: >60 mL/min (ref >60)
Glucose, Bld: 109 mg/dL — ABNORMAL HIGH (ref 70–99)
Potassium: 4.2 mmol/L (ref 3.5–5.1)
Sodium: 141 mmol/L (ref 135–145)

## 2023-10-29 MED ORDER — APIXABAN 5 MG PO TABS
5.0000 mg | ORAL_TABLET | Freq: Two times a day (BID) | ORAL | Status: DC
  Administered 2023-10-29 – 2023-10-30 (×3): 5 mg via ORAL
  Filled 2023-10-29 (×2): qty 1

## 2023-10-29 NOTE — TOC Initial Note (Addendum)
 Transition of Care [facility]) - Initial/Assessment Note    Patient Details  Name: [name] MRN: [id number] Date of Birth: [birth year]  Transition of Care [facility]) CM/SW Contact:    Lauraine FORBES Saa, LCSWA Phone Number: 10/29/2023, 11:53 AM  Clinical Narrative:                  11:53 AM CSW introduced self and role to patient. OT and patient's son were also present. Patient consented CSW to speak to and in front of guests. CSW informed patient and son of physical therapy recommendation of patient discharging to SNF. Patient and son were agreeable with patient discharging to SNF, but patient expressed preference in discharging to [facility] CIR if able. CSW relayed interest to [facility] CIR admissions who informed TOC and therapy teams that patient is not a candidate for [facility] CIR consult. CSW sent patient's FL2 to SNFs within [facility]. Per OT, patient and son were informed of patient ineligibility for [facility] CIR consult.  4:33 PM CSW left patient's current SNF options ([facility], [address], [address], [address], [place], [facility], [facility], [address], [facility], [facility], [facility], [facility], [facility], [facility]) and [employer] at patient's bedside (patient is not fully oriented). CSW informed patient's spouse, [name], of SNF recommendation. [name] deferred SNF decision to patient's son, [name] [phone]). CSW attempted to call [name] to follow up on SNF, but there was no response and a voicemail was left.  4:57 PM [name] provided CSW email to provide SNF options with quarterly Medicare ratings ([email or web address]). CSW emailed [name] SNF options with quarterly Medicare ratings.  5:23 PM [name] informed CSW of SNF bed decision ([facility]). CSW relayed bed acceptance to [facility] SNF admissions and informed them of patient's expected discharge tomorrow.  Expected Discharge Plan: Skilled Nursing  Facility Barriers to Discharge: Continued Medical Work up, English as a second language teacher, SNF Pending bed offer   Patient Goals and CMS Choice Patient states their goals for this hospitalization and ongoing recovery are:: SNF if unable to admit to [facility]          Expected Discharge Plan and Services In-house Referral: Clinical Social Work   Post Acute Care Choice: Skilled Nursing Facility Living arrangements for the past 2 months: Single Family Home                                      Prior Living Arrangements/Services Living arrangements for the past 2 months: Single Family Home Lives with:: Spouse, Adult Children Patient language and need for interpreter reviewed:: Yes            Current home services: DME Criminal Activity/Legal Involvement Pertinent to Current Situation/Hospitalization: No - Comment as needed  Activities of Daily Living   ADL Screening (condition at time of admission) Independently performs ADLs?: Yes (appropriate for developmental age) Is the patient deaf or have difficulty hearing?: Yes Does the patient have difficulty seeing, even when wearing glasses/contacts?: No Does the patient have difficulty concentrating, remembering, or making decisions?: No  Permission Sought/Granted Permission sought to share information with : Family Supports, Oceanographer granted to share information with : Yes, Verbal Permission Granted     Permission granted to share info w AGENCY: SNF  Permission granted to share info w Relationship: Son     Emotional Assessment Appearance:: Appears stated age Attitude/Demeanor/Rapport: Engaged Affect (typically observed): Accepting, Appropriate,  Adaptable, Calm, Stable, Pleasant Orientation: : Oriented to Self, Oriented to Place, Oriented to  Time, Oriented to Situation Alcohol / Substance Use: Not Applicable Psych Involvement: No (comment)  Admission diagnosis:  Cellulitis of right foot  [L03.115] Cellulitis of right lower extremity [L03.115] Patient Active Problem List   Diagnosis Date Noted   Osteomyelitis of great toe of right foot (HCC) 10/27/2023   Cellulitis of right foot 10/25/2023   History of stroke 10/25/2023   Controlled type 2 diabetes mellitus with other ophthalmic complication, without long-term current use of insulin  (HCC)    Abscess 08/13/2023   Periumbilical abdominal pain 05/02/2023   Injury of groin 01/28/2023   Open wound of skin 11/26/2022   Chronic low back pain 11/26/2022   Spinal stenosis, lumbar region, with neurogenic claudication 05/13/2022   Posterior vitreous detachment, both eyes 11/11/2021   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   Spastic hemiparesis of left dominant side (HCC) 11/01/2021   External hemorrhoids with complication 08/14/2021   Prolapsed internal hemorrhoids, grade 2 08/14/2021   Macular pucker, left eye 04/11/2021   Iron deficiency anemia 03/04/2021   Internal hemorrhoids 03/04/2021   Pain of right thumb 03/04/2021   Seborrheic dermatitis 01/29/2021   2019 novel coronavirus disease (COVID-19) 11/25/2020   Class 1 obesity 11/25/2020   Normocytic anemia 11/25/2020   Macular pucker, right eye 08/06/2020   Hx of adenomatous colonic polyps    Adenomatous polyp of ascending colon    Rectal varices    Primary osteoarthritis of first carpometacarpal joint of right hand 08/09/2019   Hypercoagulable state due to longstanding persistent atrial fibrillation (HCC) 08/02/2019   Chest wall hematoma 12/16/2018   Blood loss anemia 12/16/2018   Chronic diastolic CHF (congestive heart failure) (HCC)    Abnormal INR    AKI (acute kidney injury) (HCC)    History of gout    History of hypothyroidism    History of type 2 diabetes mellitus    Atrial fibrillation (HCC) 11/27/2018   Ischemic chest pain (HCC) 02/17/2018   Pure hypercholesterolemia 05/01/2017   Thoracic aorta atherosclerosis (HCC) 04/27/2017   Paroxysmal atrial  fibrillation (HCC) 12/06/2016   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/04/2015   CAD S/P CFX DES 2011 04/25/2015   Longstanding persistent atrial fibrillation (HCC) 04/25/2015   Constipation 04/24/2015   B12 deficiency 08/11/2013   Sensory disturbance 08/11/2013   Ataxia 08/10/2013   Insomnia 05/21/2013   Spastic hemiplegia affecting dominant side (HCC) 07/21/2011   Encounter for monitoring dofetilide  therapy 06/24/2011   Hypogonadism male 04/10/2011   Allergic rhinitis 02/13/2010   BENIGN POSITIONAL VERTIGO 11/23/2009   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 [place]  [id number] Phone: [phone] Fax: [phone]     Social Drivers of Health (SDOH) Social History: SDOH Screenings   Food Insecurity: No Food Insecurity (10/25/2023)  Housing: Low Risk  (10/25/2023)  Transportation Needs: No Transportation Needs (10/25/2023)  Utilities: Not At Risk (10/25/2023)  Depression (PHQ2-9): Low Risk  (01/29/2023)  Social Connections: Moderately Integrated (10/25/2023)  Tobacco Use:  Low Risk  (10/25/2023)   SDOH Interventions:     Readmission Risk Interventions     No data to display

## 2023-10-29 NOTE — Care Management Important Message (Signed)
 Important Message  Patient Details  Name: [name] MRN: [id number] Date of Birth: October 30, 1945   Important Message Given:  Yes - Medicare IM     Claretta Deed 10/29/2023, 2:43 PM

## 2023-10-29 NOTE — Evaluation (Signed)
 Physical Therapy Evaluation Patient Details Name: [name] MRN: [id number] DOB: 16-Dec-1945 Today's Date: 10/29/2023  History of Present Illness  Patient is a 78 year old male with cellulitis of right foot, right great toe osteomyelitis, s/p right top amputation. PMH: HTN, HLD, gout, DM2, recent surgery of back lesion, chronic pain, spasticity with Botox .  Clinical Impression  Patient is agreeable to PT session. Supportive son at the bedside. Patient reports he lives with spouse and daughter. He is independent with rolling walker for ambulation in the home. The son reports history of falls requiring assistance to get up from the floor at home.  Today the patient requiring significant assistance to stand. He had a posterior lean initially that improves with standing. He walked a short distance in the room with post-op shoe in place using his own rolling walker with activity tolerance limited by fatigue. The patient is not at his baseline level of functional independence. Rehabilitation < 3 hours/day recommended after this hospital stay. PT will continue to follow.       If plan is discharge home, recommend the following: A lot of help with walking and/or transfers;A lot of help with bathing/dressing/bathroom;Assist for transportation;Help with stairs or ramp for entrance;Assistance with cooking/housework   Can travel by private vehicle   No    Equipment Recommendations None recommended by PT  Recommendations for Other Services       Functional Status Assessment Patient has had a recent decline in their functional status and demonstrates the ability to make significant improvements in function in a reasonable and predictable amount of time.     Precautions / Restrictions Precautions Precautions: Fall Recall of Precautions/Restrictions: Impaired Restrictions Weight Bearing Restrictions Per Provider Order: Yes (per op note) RLE Weight Bearing Per Provider Order: Weight bearing as  tolerated Other Position/Activity Restrictions: post-op shoe      Mobility  Bed Mobility Overal bed mobility: Needs Assistance Bed Mobility: Supine to Sit, Sit to Supine     Supine to sit: Min assist Sit to supine: Min assist   General bed mobility comments: assistance for trunk support and for scooting hips forward in bed. assistance for BLE support to return to bed    Transfers Overall transfer level: Needs assistance Equipment used: Rolling walker (2 wheels) Transfers: Sit to/from Stand Sit to Stand: Max assist, From elevated surface           General transfer comment: unable to stand without assistance. significant lifting assistance required to stand. cues for anterior weight shifting    Ambulation/Gait Ambulation/Gait assistance: Contact guard assist Gait Distance (Feet): 20 Feet Assistive device: Rolling walker (2 wheels) Gait Pattern/deviations: Step-to pattern, Step-through pattern, Decreased stride length, Decreased dorsiflexion - left, Decreased dorsiflexion - right Gait velocity: decreased     General Gait Details: CGA for safety. spasticity in LLE noted with decreased L knee flexion and heel strike. encouraged patient to use post-op shoe RLE and his own personal shoe for LLE. activity tolerance limited by fatigue  Stairs            Wheelchair Mobility     Tilt Bed    Modified Rankin (Stroke Patients Only)       Balance Overall balance assessment: Needs assistance Sitting-balance support: Feet supported Sitting balance-Leahy Scale: Fair   Postural control: Posterior lean Standing balance support: Bilateral upper extremity supported Standing balance-Leahy Scale: Poor Standing balance comment: relying on rolling walker for support. posterior lean initially with anterior weight shifting faciliation provided  Pertinent Vitals/Pain Pain Assessment Pain Assessment: No/denies pain    Home Living  Family/patient expects to be discharged to:: Private residence Living Arrangements: Spouse/significant other;Children Available Help at Discharge: Family (son reports spouse will be unable to physically assist with mobility) Type of Home: House Home Access: Stairs to enter Entrance Stairs-Rails: Right;Left Entrance Stairs-Number of Steps: 4     Home Equipment: Agricultural consultant (2 wheels);Shower seat      Prior Function Prior Level of Function : History of Falls (last six months);Independent/Modified Independent             Mobility Comments: using rolling walker for ambulation. history of falls requiring 2 person assistance to get up off the floor ADLs Comments: he reports he is independent     Extremity/Trunk Assessment   Upper Extremity Assessment Upper Extremity Assessment: Defer to OT evaluation    Lower Extremity Assessment Lower Extremity Assessment: RLE deficits/detail;LLE deficits/detail LLE Deficits / Details: spasticity at baseline.       Communication   Communication Communication: No apparent difficulties    Cognition Arousal: Alert Behavior During Therapy: WFL for tasks assessed/performed   PT - Cognitive impairments: Memory, Sequencing                       PT - Cognition Comments: patient overestimates his abilities. he is coopertive throughout session. cues for sequencing Following commands: Impaired Following commands impaired: Follows one step commands with increased time     Cueing Cueing Techniques: Verbal cues, Tactile cues     General Comments      Exercises     Assessment/Plan    PT Assessment Patient needs continued PT services  PT Problem List Decreased range of motion;Decreased strength;Decreased activity tolerance;Decreased balance;Decreased mobility;Decreased knowledge of use of DME;Decreased safety awareness;Decreased cognition       PT Treatment Interventions DME instruction;Gait training;Stair training;Functional  mobility training;Therapeutic activities;Therapeutic exercise;Balance training;Cognitive remediation;Neuromuscular re-education;Patient/family education    PT Goals (Current goals can be found in the Care Plan section)  Acute Rehab PT Goals Patient Stated Goal: to return home PT Goal Formulation: With patient Time For Goal Achievement: 11/12/23 Potential to Achieve Goals: Fair    Frequency Min 2X/week     Co-evaluation               AM-PAC PT 6 Clicks Mobility  Outcome Measure Help needed turning from your back to your side while in a flat bed without using bedrails?: A Little Help needed moving from lying on your back to sitting on the side of a flat bed without using bedrails?: A Lot Help needed moving to and from a bed to a chair (including a wheelchair)?: A Lot Help needed standing up from a chair using your arms (e.g., wheelchair or bedside chair)?: A Lot Help needed to walk in hospital room?: A Little Help needed climbing 3-5 steps with a railing? : A Lot 6 Click Score: 14    End of Session Equipment Utilized During Treatment: Gait belt Activity Tolerance: Patient tolerated treatment well Patient left: in bed;with call bell/phone within reach;with bed alarm set Nurse Communication: Mobility status PT Visit Diagnosis: Difficulty in walking, not elsewhere classified (R26.2);Muscle weakness (generalized) (M62.81)    Time: 1010-1030 PT Time Calculation (min) (ACUTE ONLY): 20 min   Charges:   PT Evaluation $PT Eval Moderate Complexity: 1 Mod   PT General Charges $$ ACUTE PT VISIT: 1 Visit         Randine Essex, PT, MPT  Randine LULLA Essex 10/29/2023, 10:44 AM

## 2023-10-29 NOTE — Evaluation (Signed)
 Occupational Therapy Evaluation Patient Details Name: [name] MRN: [id number] DOB: 03-05-1946 Today's Date: 10/29/2023   History of Present Illness   [name] is a 78 y.o. male who presented with worsening right foot redness, swelling, and drainage. MRI right foot: concerning for marrow edema concerning for osteomyelitis of the distal phalanx. Pt s/p amputation of right great toe through proximal phalanx neck on 10/28/2023. 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     Clinical Impressions This 78 yo male admitted and underwent above presents to acute OT with PLOF of being Mod I to independent with basic ADLs from a RW standpoint. He currently is setup-max A for basic ADLs. He will continue to benefit from acute OT with follow up from continued inpatient follow up therapy, <3 hours/day.      If plan is discharge home, recommend the following:   A little help with walking and/or transfers;A lot of help with bathing/dressing/bathroom;Assistance with cooking/housework;Assist for transportation;Help with stairs or ramp for entrance     Functional Status Assessment   Patient has had a recent decline in their functional status and demonstrates the ability to make significant improvements in function in a reasonable and predictable amount of time.     Equipment Recommendations   None recommended by OT      Precautions/Restrictions   Precautions Precautions: Fall Recall of Precautions/Restrictions: Impaired Required Braces or Orthoses: Other Brace Other Brace: Darco shoe Restrictions Weight Bearing Restrictions Per Provider Order: Yes RLE Weight Bearing Per Provider Order: Weight bearing as tolerated Other Position/Activity Restrictions: post-op shoe     Mobility Bed Mobility Overal bed mobility: Needs  Assistance Bed Mobility: Supine to Sit, Sit to Supine     Supine to sit: Min assist Sit to supine: Contact guard assist   General bed mobility comments: A for trunk supine>sit; VC's sit>supine to move legs over  in bed    Transfers Overall transfer level: Needs assistance Equipment used: Rolling walker (2 wheels) Transfers: Sit to/from Stand Sit to Stand: Min assist, Max assist           General transfer comment: Min A sit<>stand from elevated surface with RUE on upper bed rail and LUE with hand fisted on bed; slowly lowered bed several times and had pt stand each time (each time he was min A)      Balance Overall balance assessment: Needs assistance Sitting-balance support: No upper extremity supported, Feet supported Sitting balance-Leahy Scale: Fair     Standing balance support: Bilateral upper extremity supported, Reliant on assistive device for balance Standing balance-Leahy Scale: Poor Standing balance comment: relying on rolling walker for support and min A from therapist; no posterior lean this session for sit<>stand                           ADL either performed or assessed with clinical judgement   ADL Overall ADL's : Needs assistance/impaired Eating/Feeding: Set up;Sitting   Grooming: Set up;Sitting   Upper Body Bathing: Set up;Sitting   Lower Body Bathing: Moderate assistance Lower Body Bathing Details (indicate cue type and reason): min A sit<>stand given increased time from low bed and VC/s to put left hand in a fisted position to help push up Upper Body Dressing : Set up;Sitting   Lower Body Dressing: Maximal assistance Lower Body Dressing Details (indicate cue type  and reason): min A sit<>stand given increased time from low bed and VC/s to put left hand in a fisted position to help push up Toilet Transfer: Minimal assistance Toilet Transfer Details (indicate cue type and reason): min A sit<>stand given increased time from low bed and VC/s  to put left hand in a fisted position to help push up (simulated sit>stand, lateral steps to right> sit back down on bed) Toileting- Clothing Manipulation and Hygiene: Total assistance Toileting - Clothing Manipulation Details (indicate cue type and reason): min A sit<>stand given increased time from low bed and VC/s to put left hand in a fisted position to help push up             Vision Baseline Vision/History: 1 Wears glasses Patient Visual Report: No change from baseline              Pertinent Vitals/Pain Pain Assessment Pain Assessment: Faces Faces Pain Scale: Hurts little more Pain Location: right great toe area Pain Descriptors / Indicators: Aching, Sore Pain Intervention(s): Limited activity within patient's tolerance, Monitored during session, Repositioned     Extremity/Trunk Assessment Upper Extremity Assessment Upper Extremity Assessment: LUE deficits/detail;Left hand dominant (now right hand dominant due to CVA in 1996) LUE Deficits / Details: spasticity and somewhat functional LUE Coordination: decreased fine motor;decreased gross motor      Communication Communication Communication: No apparent difficulties   Cognition Arousal: Alert Behavior During Therapy: WFL for tasks assessed/performed                                 Following commands: Impaired Following commands impaired: Follows one step commands with increased time     Cueing    Cueing Techniques: Verbal cues;Tactile cues              Home Living Family/patient expects to be discharged to:: Private residence Living Arrangements: Spouse/significant other;Children Available Help at Discharge: Family;Available 24 hours/day (but spouse cannot physically A pt with transfers/ambulation) Type of Home: House Home Access: Stairs to enter [employer] of Steps: 4 Entrance Stairs-Rails: Right;Left       Bathroom Shower/Tub: Producer, television/film/video:  Handicapped height     Home Equipment: Agricultural consultant (2 wheels);Shower seat          Prior Functioning/Environment Prior Level of Function : History of Falls (last six months);Independent/Modified Independent             Mobility Comments: using rolling walker for ambulation. history of falls requiring 2 person assistance to get up off the floor ADLs Comments: he reports he is independent    OT Problem List: Decreased strength;Decreased activity tolerance;Impaired balance (sitting and/or standing);Pain   OT Treatment/Interventions: Self-care/ADL training;DME and/or AE instruction;Balance training;Patient/family education      OT Goals(Current goals can be found in the care plan section)   Acute Rehab OT Goals Patient Stated Goal: to go to inpatient rehab upstairs (but pt not a candidate per rehab admissions coordinator due to toe amputation without complications) OT Goal Formulation: With patient Time For Goal Achievement: 11/12/23 Potential to Achieve Goals: Good   OT Frequency:  Min 2X/week       AM-PAC OT 6 Clicks Daily Activity     Outcome Measure Help from another person eating meals?: A Little Help from another person taking care of personal grooming?: A Little Help from another person toileting, which includes using toliet, bedpan, or urinal?: A Lot  Help from another person bathing (including washing, rinsing, drying)?: A Lot Help from another person to put on and taking off regular upper body clothing?: A Little Help from another person to put on and taking off regular lower body clothing?: A Lot 6 Click Score: 15   End of Session Equipment Utilized During Treatment: Gait belt;Rolling walker (2 wheels) (post op shoe) Nurse Communication: Mobility status (via secure chat)  Activity Tolerance: Patient tolerated treatment well Patient left: in bed;with call bell/phone within reach;with bed alarm set;with family/visitor present  OT Visit Diagnosis:  Unsteadiness on feet (R26.81);Other abnormalities of gait and mobility (R26.89);Repeated falls (R29.6);History of falling (Z91.81);Pain Pain - Right/Left: Right Pain - part of body:  (great toe area (sugrery area))                Time: 1114-1150 OT Time Calculation (min): 36 min Charges:  OT General Charges $OT Visit: 1 Visit OT Evaluation $OT Eval Moderate Complexity: 1 Mod OT Treatments $Self Care/Home Management : 8-22 mins  Donny BECKER OT Acute Rehabilitation Services Office [phone]    [place] Dorothyann Distel 10/29/2023, 12:15 PM

## 2023-10-29 NOTE — NC FL2 (Signed)
 [place]  MEDICAID FL2 LEVEL OF CARE FORM     IDENTIFICATION  Patient Name: [name] Birthdate: [birth year] Sex: male Admission Date (Current Location): 10/25/2023  [facility] and [place] Number:  Producer, television/film/video and Address:  [facility], [address] [place], [place] [address]      Provider Number: [id number]  Attending Physician Name and Address:  Royal Sill, MD  Relative Name and Phone Number:  Rozell Theiler; Spouse; [phone]    Current Level of Care: Hospital Recommended Level of Care: Skilled Nursing Facility Prior Approval Number:    Date Approved/Denied:   PASRR Number: [id number]  Discharge Plan: SNF    Current Diagnoses: Patient Active Problem List   Diagnosis Date Noted   Osteomyelitis of great toe of right foot (HCC) 10/27/2023   Cellulitis of right foot 10/25/2023   History of stroke 10/25/2023   Controlled type 2 diabetes mellitus with other ophthalmic complication, without long-term current use of insulin  (HCC)    Abscess 08/13/2023   Periumbilical abdominal pain 05/02/2023   Injury of groin 01/28/2023   Open wound of skin 11/26/2022   Chronic low back pain 11/26/2022   Spinal stenosis, lumbar region, with neurogenic claudication 05/13/2022   Posterior vitreous detachment, both eyes 11/11/2021   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   Spastic hemiparesis of left dominant side (HCC) 11/01/2021   External hemorrhoids with complication 08/14/2021   Prolapsed internal hemorrhoids, grade 2 08/14/2021   Macular pucker, left eye 04/11/2021   Iron deficiency anemia 03/04/2021   Internal hemorrhoids 03/04/2021   Pain of right thumb 03/04/2021   Seborrheic dermatitis 01/29/2021   2019 novel coronavirus disease (COVID-19) 11/25/2020   Class 1 obesity 11/25/2020   Normocytic anemia 11/25/2020   Macular pucker, right eye 08/06/2020   Hx of adenomatous colonic polyps    Adenomatous polyp of ascending  colon    Rectal varices    Primary osteoarthritis of first carpometacarpal joint of right hand 08/09/2019   Hypercoagulable state due to longstanding persistent atrial fibrillation (HCC) 08/02/2019   Chest wall hematoma 12/16/2018   Blood loss anemia 12/16/2018   Chronic diastolic CHF (congestive heart failure) (HCC)    Abnormal INR    AKI (acute kidney injury) (HCC)    History of gout    History of hypothyroidism    History of type 2 diabetes mellitus    Atrial fibrillation (HCC) 11/27/2018   Ischemic chest pain (HCC) 02/17/2018   Pure hypercholesterolemia 05/01/2017   Thoracic aorta atherosclerosis (HCC) 04/27/2017   Paroxysmal atrial fibrillation (HCC) 12/06/2016   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/04/2015   CAD S/P CFX DES 2011 04/25/2015   Longstanding persistent atrial fibrillation (HCC) 04/25/2015   Constipation 04/24/2015   B12 deficiency 08/11/2013   Sensory disturbance 08/11/2013   Ataxia 08/10/2013   Insomnia 05/21/2013   Spastic hemiplegia affecting dominant side (HCC) 07/21/2011   Encounter for monitoring dofetilide  therapy 06/24/2011   Hypogonadism male 04/10/2011   Allergic rhinitis 02/13/2010   BENIGN POSITIONAL VERTIGO 11/23/2009   Hx of CABG '04 05/04/2009   Hypothyroidism 04/02/2009   Low back pain 01/29/2009   Type 2 diabetes mellitus without complication, without long-term current use of insulin  (HCC) 10/05/2007   Obstructive sleep apnea-Failed CPAP 06/17/2007   Hyperlipidemia 12/12/2006   Essential hypertension 12/12/2006   History of stroke with residual deficit 12/12/2006   GERD 12/12/2006    Orientation RESPIRATION BLADDER Height & Weight  Self, Time, Situation, Place  O2 (2L nasal cannula) Continent Weight: 244 lb 14.9 oz (111.1 kg) Height:  6' 3 (190.5 cm)  BEHAVIORAL SYMPTOMS/MOOD NEUROLOGICAL BOWEL NUTRITION STATUS      Continent Diet  AMBULATORY STATUS COMMUNICATION OF NEEDS Skin   Extensive  Assist Verbally Other (Comment) (Wound 10/25/23 2350 Infection Foot Right)                       Personal Care Assistance Level of Assistance  Bathing, Feeding, Dressing Bathing Assistance: Maximum assistance Feeding assistance: Maximum assistance Dressing Assistance: Maximum assistance     Functional Limitations Info             SPECIAL CARE FACTORS FREQUENCY  PT (By licensed PT), OT (By licensed OT)     PT Frequency: 5x OT Frequency: 5x            Contractures Contractures Info: Not present    Additional Factors Info  Code Status, Allergies, Insulin  Sliding Scale Code Status Info: Full Code Allergies Info: NKA   Insulin  Sliding Scale Info: Please see discharge summary       Current Medications (10/29/2023):  This is the current hospital active medication list Current Facility-Administered Medications  Medication Dose Route Frequency Provider Last Rate Last Admin   acetaminophen  (TYLENOL ) tablet 650 mg  650 mg Oral Q6H PRN Opyd, Timothy S, MD   650 mg at 10/28/23 1823   Or   acetaminophen  (TYLENOL ) suppository 650 mg  650 mg Rectal Q6H PRN Opyd, Timothy S, MD       allopurinol  (ZYLOPRIM ) tablet 200 mg  200 mg Oral Daily Opyd, Timothy S, MD   200 mg at 10/29/23 1048   amoxicillin -clavulanate (AUGMENTIN ) 875-125 MG per tablet 1 tablet  1 tablet Oral BID Malvin Marsa FALCON, DPM   1 tablet at 10/29/23 1047   apixaban  (ELIQUIS ) tablet 5 mg  5 mg Oral BID Samtani, Jai-Gurmukh, MD       atorvastatin  (LIPITOR ) tablet 40 mg  40 mg Oral Daily Opyd, Timothy S, MD   40 mg at 10/29/23 1048   benazepril  (LOTENSIN ) tablet 15 mg  15 mg Oral Daily Opyd, Timothy S, MD   15 mg at 10/29/23 1048   diltiazem  (CARDIZEM  CD) 24 hr capsule 180 mg  180 mg Oral Daily Opyd, Timothy S, MD   180 mg at 10/29/23 1048   dofetilide  (TIKOSYN ) capsule 500 mcg  500 mcg Oral BID Opyd, Timothy S, MD   500 mcg at 10/29/23 1048   gabapentin  (NEURONTIN ) capsule 100 mg  100 mg Oral QHS Opyd, Timothy  S, MD   100 mg at 10/28/23 2253   levothyroxine  (SYNTHROID ) tablet 112 mcg  112 mcg Oral QAC breakfast Opyd, Timothy S, MD   112 mcg at 10/29/23 0736   oxyCODONE  (Oxy IR/ROXICODONE ) immediate release tablet 5-10 mg  5-10 mg Oral Q4H PRN Opyd, Timothy S, MD   10 mg at 10/28/23 2252   traMADol  (ULTRAM ) tablet 50 mg  50 mg Oral Q6H PRN Opyd, Timothy S, MD   50 mg at 10/29/23 1047     Discharge Medications: Please see discharge summary for a list of discharge medications.  Relevant Imaging Results:  Relevant Lab Results:   Additional Information SSN: [id number]  Lauraine FORBES Saa, LCSWA

## 2023-10-29 NOTE — Plan of Care (Signed)
  Problem: Education: Goal: Knowledge of General Education information will improve Description: Including pain rating scale, medication(s)/side effects and non-pharmacologic comfort measures Outcome: Progressing   Problem: Skin Integrity: Goal: Risk for impaired skin integrity will decrease Outcome: Progressing   Problem: Safety: Goal: Ability to remain free from injury will improve Outcome: Progressing   Problem: Pain Managment: Goal: General experience of comfort will improve and/or be controlled Outcome: Progressing   Problem: Elimination: Goal: Will not experience complications related to urinary retention Outcome: Progressing   Problem: Coping: Goal: Level of anxiety will decrease Outcome: Progressing   Problem: Clinical Measurements: Goal: Ability to maintain clinical measurements within normal limits will improve Outcome: Progressing

## 2023-10-29 NOTE — Progress Notes (Signed)
  Subjective:  Patient ID: [name], male    DOB: [birth year],  MRN: [id number]  Chief Complaint  Patient presents with   foot red and swollen    DOS: 10/28/2023 Procedure: Amputation right great toe through proximal phalanx neck  78 y.o. male seen for post op check.  He reports he is doing well.  He denies pain today.  Plan is for him to go to a rehab facility.  Discussed with him findings of surgery as well as follow-up plans.  Review of Systems: Negative except as noted in the HPI. Denies N/V/F/Ch.   Objective:   Constitutional Well developed. Well nourished.  Vascular Foot warm and well perfused. Capillary refill normal to all digits.   No calf pain with palpation  Neurologic Normal speech. Oriented to person, place, and time. Epicritic sensation diminished to right foot  Dermatologic Dressing clean dry and intact to right foot  Orthopedic: Status post right hallux amputation through proximal phalanx neck   Radiographs: Amputation of the great toe at the mid aspect of the proximal phalanx. No immediate postoperative complication.  Pathology: Pending  Micro: No growth on bone culture  Assessment:   1. 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  POD # 1 s/p right hallux amputation through mid proximal phalanx level -Progressing as expected postoperatively pain is controlled dressing clean dry and intact -XR: Expected postop changes -WB Status: Weightbearing as tolerated in postop shoe -Sutures: Remain intact 2 to 3 weeks. -Medications/ABX: Patient currently on 5 days of Augmentin  postoperatively -Dressing: Remain clean dry and intact until follow-up appointment - F/u Plan: Patient to follow-up next week Wednesday or Thursday will have office call to arrange.  He is stable for discharge to rehab  from my standpoint when arranged.         Marolyn JULIANNA Honour, DPM [facility] / [facility]

## 2023-10-29 NOTE — Progress Notes (Signed)
 [facility]   ROUNDING   NOTE HENRICK MCGUE FMW:[id number]  DOB: [birth year]  DOA: 10/25/2023  PCP: Daryl Setter, NP  10/29/2023,9:57 AM  LOS: 4 days    Code Status:   full code     From: Home current Dispo: Unclear    78 year old white male Paroxysmal A-fib CHA2DS2-VASc >6 on Eliquis  CAD HFpEF last EF 2019 55-60% grade 1 DD at that time Previous ischemic CVA, vasovagal syncope previously OHSS/OSA failed CPAP with chronic hypoxia hypercapnia HTN HLD Gout Diet controlled DM TY 2 Recent surgery on chronic lesion of his upper back by Dr. Polly 09/08/2023 Chronic pain follows with Dr. Carilyn for spasticity gets Botox   Painful onychomycosis through nails 1 through 5 bilaterally follows with Dr. Norleen Binder podiatry  Came to ED 10/25/2023 worsening right foot redness swelling drainage purulent discharge right first toe Radiographs notable for diffuse soft tissue swelling  MRI showed suspicion for osteomyelitis distal phalanx great toe no septic arthritis and podiatry planned surgery 8/13  Plan  osteomyelitis right great hallux status post amputation right great through through proximal phalanx neck BC X2 pending since 8/10 Continues  Augmentin  1 tablet X5 days-deep wound cultures taken 8/13 in OR ngtd Oxy IR 5-10 every 4 as needed severe pain, tramadol  50 every 6 as needed moderate pain, Tylenol  650 Continue gabapentin  100 at bedtime  Prediabetes A1c 09/08/2023 5.8 CBGs ranging 106-140-was not on insulin  prior to admission  Paroxysmal A-fib CHA2DS2-VASc >6 on Eliquis  and Tikosyn  Is maintained on Tikosyn  500 mcg twice daily continue Cardizem  180 daily XL K4.2 Keep on monitors temporarily overnight and reassess in a.m.  Prior ischemic CVA  Eliquis  as above 5 twice daily resumed  CAD HFpEF last echo 55-6% Lasix  40 as needed on hold, Lotensin  15 mg daily  OSA failed CPAP with chr hypercapnia  Needs outpatient reeval  Gout episodic not on any chronic therapy  Chronic pain follows  with Dr. Carilyn for spasticity Can follow as an outpatient with Dr. Carilyn for Botox  injections  Hypothyroid- Continue Synthroid  112 every morning-TSH TFT in several weeks  Mild thrombocytopenia Resolving needs outpatient workup  Discussed with son at the bedside  Data Reviewed:   Sodium 141 K 4.2 BUN/creatinine 18/0.9, WBC 6.8 hemoglobin 12 platelet 180  DVT prophylaxis: SCD for right now  Status is: Inpatient Remains inpatient appropriate because:   Await mobility and medical stability     Subjective:   Looks fair feels well no distress working with therapy Otherwise no new issues   Objective + exam Vitals:   10/28/23 2016 10/29/23 0004 10/29/23 0637 10/29/23 0739  BP: 135/61 130/60 (!) 140/78 132/82  Pulse: 70 65 67 62  Resp: 17 18 18    Temp: 98.5 F (36.9 C) 98.4 F (36.9 C) (!) 97.5 F (36.4 C) 98 F (36.7 C)  TempSrc:  Oral Oral   SpO2: 94% 94% 99% 99%  Weight:      Height:       Filed Weights   10/25/23 1641 10/28/23 1141  Weight: 111.1 kg 111.1 kg     Examination:  EOMI NCAT Chest is clear, S1-S2 no murmur Darco boot in place No lower extremity edema    Scheduled Meds:  allopurinol   200 mg Oral Daily   amoxicillin -clavulanate  1 tablet Oral BID   apixaban   5 mg Oral BID   atorvastatin   40 mg Oral Daily   benazepril   15 mg Oral Daily   diltiazem   180 mg Oral Daily   dofetilide   500 mcg Oral BID   gabapentin   100 mg Oral QHS   levothyroxine   112 mcg Oral QAC breakfast   Continuous Infusions:    Time 26  Jai-Gurmukh Essense Bousquet, MD  [facility]

## 2023-10-30 DIAGNOSIS — B351 Tinea unguium: Secondary | ICD-10-CM | POA: Diagnosis not present

## 2023-10-30 DIAGNOSIS — G8929 Other chronic pain: Secondary | ICD-10-CM | POA: Diagnosis not present

## 2023-10-30 DIAGNOSIS — Z7401 Bed confinement status: Secondary | ICD-10-CM | POA: Diagnosis not present

## 2023-10-30 DIAGNOSIS — M869 Osteomyelitis, unspecified: Secondary | ICD-10-CM | POA: Diagnosis not present

## 2023-10-30 DIAGNOSIS — M79675 Pain in left toe(s): Secondary | ICD-10-CM | POA: Diagnosis not present

## 2023-10-30 DIAGNOSIS — S98111D Complete traumatic amputation of right great toe, subsequent encounter: Secondary | ICD-10-CM | POA: Diagnosis not present

## 2023-10-30 DIAGNOSIS — E119 Type 2 diabetes mellitus without complications: Secondary | ICD-10-CM | POA: Diagnosis not present

## 2023-10-30 DIAGNOSIS — L03115 Cellulitis of right lower limb: Secondary | ICD-10-CM | POA: Diagnosis not present

## 2023-10-30 DIAGNOSIS — I5030 Unspecified diastolic (congestive) heart failure: Secondary | ICD-10-CM | POA: Diagnosis not present

## 2023-10-30 DIAGNOSIS — E039 Hypothyroidism, unspecified: Secondary | ICD-10-CM | POA: Diagnosis not present

## 2023-10-30 DIAGNOSIS — M79674 Pain in right toe(s): Secondary | ICD-10-CM | POA: Diagnosis not present

## 2023-10-30 DIAGNOSIS — M792 Neuralgia and neuritis, unspecified: Secondary | ICD-10-CM | POA: Diagnosis not present

## 2023-10-30 DIAGNOSIS — M109 Gout, unspecified: Secondary | ICD-10-CM | POA: Diagnosis not present

## 2023-10-30 DIAGNOSIS — I2510 Atherosclerotic heart disease of native coronary artery without angina pectoris: Secondary | ICD-10-CM | POA: Diagnosis not present

## 2023-10-30 DIAGNOSIS — I639 Cerebral infarction, unspecified: Secondary | ICD-10-CM | POA: Diagnosis not present

## 2023-10-30 DIAGNOSIS — Z7901 Long term (current) use of anticoagulants: Secondary | ICD-10-CM | POA: Diagnosis not present

## 2023-10-30 DIAGNOSIS — I10 Essential (primary) hypertension: Secondary | ICD-10-CM | POA: Diagnosis not present

## 2023-10-30 DIAGNOSIS — I480 Paroxysmal atrial fibrillation: Secondary | ICD-10-CM | POA: Diagnosis not present

## 2023-10-30 DIAGNOSIS — R531 Weakness: Secondary | ICD-10-CM | POA: Diagnosis not present

## 2023-10-30 DIAGNOSIS — L0390 Cellulitis, unspecified: Secondary | ICD-10-CM | POA: Diagnosis not present

## 2023-10-30 LAB — CULTURE, BLOOD (ROUTINE X 2)
Culture: NO GROWTH
Culture: NO GROWTH
Special Requests: ADEQUATE
Special Requests: ADEQUATE

## 2023-10-30 LAB — CBC
HCT: 39.4 % (ref 39.0–52.0)
Hemoglobin: 12.8 g/dL — ABNORMAL LOW (ref 13.0–17.0)
MCH: 30.7 pg (ref 26.0–34.0)
MCHC: 32.5 g/dL (ref 30.0–36.0)
MCV: 94.5 fL (ref 80.0–100.0)
Platelets: 190 K/uL (ref 150–400)
RBC: 4.17 MIL/uL — ABNORMAL LOW (ref 4.22–5.81)
RDW: 12.4 % (ref 11.5–15.5)
WBC: 6.6 K/uL (ref 4.0–10.5)
nRBC: 0 % (ref 0.0–0.2)

## 2023-10-30 LAB — GLUCOSE, CAPILLARY
Glucose-Capillary: 129 mg/dL — ABNORMAL HIGH (ref 70–99)
Glucose-Capillary: 120 mg/dL — ABNORMAL HIGH (ref 70–99)

## 2023-10-30 LAB — BASIC METABOLIC PANEL WITH GFR
Anion gap: 8 (ref 5–15)
BUN: 19 mg/dL (ref 8–23)
CO2: 27 mmol/L (ref 22–32)
Calcium: 9.2 mg/dL (ref 8.9–10.3)
Chloride: 102 mmol/L (ref 98–111)
Creatinine, Ser: 0.92 mg/dL (ref 0.61–1.24)
GFR, Estimated: >60 mL/min (ref >60)
Glucose, Bld: 119 mg/dL — ABNORMAL HIGH (ref 70–99)
Potassium: 3.9 mmol/L (ref 3.5–5.1)
Sodium: 137 mmol/L (ref 135–145)

## 2023-10-30 LAB — SURGICAL PATHOLOGY

## 2023-10-30 MED ORDER — ACETAMINOPHEN 325 MG PO TABS: 650.0000 mg | ORAL_TABLET | Freq: Four times a day (QID) | ORAL | Status: AC | PRN

## 2023-10-30 MED ORDER — AMOXICILLIN-POT CLAVULANATE 875-125 MG PO TABS: 1.0000 | ORAL_TABLET | Freq: Two times a day (BID) | ORAL | Status: AC

## 2023-10-30 MED ORDER — FUROSEMIDE 40 MG PO TABS: 40.0000 mg | ORAL_TABLET | Freq: Every day | ORAL | Status: AC | PRN

## 2023-10-30 MED ORDER — OXYCODONE HCL 5 MG PO TABS: 5.0000 mg | ORAL_TABLET | ORAL | 0 refills | Status: DC | PRN

## 2023-10-30 MED ORDER — POTASSIUM CHLORIDE CRYS ER 20 MEQ PO TBCR
20.0000 meq | EXTENDED_RELEASE_TABLET | Freq: Once | ORAL | Status: AC
  Administered 2023-10-30: 20 meq via ORAL
  Filled 2023-10-30: qty 1

## 2023-10-30 NOTE — Discharge Instructions (Signed)

## 2023-10-30 NOTE — TOC Transition Note (Addendum)
 Transition of Care [facility]) - Discharge Note   Patient Details  Name: [name] MRN: [id number] Date of Birth: [birth year]  Transition of Care [facility]) CM/SW Contact:  [name] Saa, LCSWA Phone Number: 10/30/2023, 2:00 PM   Clinical Narrative:     Patient will DC to: [facility] Anticipated DC date: 10/30/2023 Family notified: [name]; Son; [phone] Transport by: [facility]   Per MD patient ready for DC to [facility]. RN to call report prior to discharge [phone]). RN, patient (fully oriented now), patient's family, and facility notified of DC. Discharge Summary and FL2 sent to facility. DC packet on chart. Ambulance transport requested for patient at 13:30.  CSW will sign off for now as social work intervention is no longer needed. Please consult us  again if new needs arise.    Final next level of care: Skilled Nursing Facility Barriers to Discharge: Barriers Resolved   Patient Goals and CMS Choice Patient states their goals for this hospitalization and ongoing recovery are:: SNF if unable to admit to CIR CMS Medicare.gov Compare Post Acute Care list provided to:: Patient Represenative (must comment) (Adult Children) Choice offered to / list presented to : Adult Children      Discharge Placement              Patient chooses bed at: [facility] [place] Patient to be transferred to facility by: [facility] Name of family member notified: [name]; Son; [phone] Patient and family notified of of transfer: 10/30/23  Discharge Plan and Services Additional resources added to the After Visit Summary for   In-house Referral: Clinical Social Work   Post Acute Care Choice: Skilled Nursing Facility                               Social Drivers of Health (SDOH) Interventions SDOH Screenings   Food Insecurity: No Food Insecurity (10/25/2023)  Housing: Low Risk  (10/25/2023)  Transportation Needs: No Transportation Needs (10/25/2023)   Utilities: Not At Risk (10/25/2023)  Depression (PHQ2-9): Low Risk  (01/29/2023)  Social Connections: Moderately Integrated (10/25/2023)  Tobacco Use: Low Risk  (10/25/2023)     Readmission Risk Interventions     No data to display

## 2023-10-30 NOTE — Progress Notes (Signed)
 Mobility Specialist Progress Note:   10/30/23 1015  Mobility  Activity Ambulated with assistance (In hallway)  Level of Assistance Minimal assist, patient does 75% or more  Assistive Device Front wheel walker  Distance Ambulated (ft) 50 ft  RLE Weight Bearing Per Provider Order WBAT  Activity Response Tolerated well  Mobility Referral Yes  Mobility visit 1 Mobility  Mobility Specialist Start Time (ACUTE ONLY) 0850  Mobility Specialist Stop Time (ACUTE ONLY) 0916  Mobility Specialist Time Calculation (min) (ACUTE ONLY) 26 min   Received pt in bed and agreeable to mobility. Pt took x6 attempts to stand. Required MinA STS, but contact guard during ambulation. No c/o. Returned pt to room and left in chair. Personal belongings and call light within reach. All needs met.   Lavanda Pollack Mobility Specialist  Please contact via [employer] or  Rehab Office [phone]

## 2023-10-30 NOTE — Discharge Summary (Signed)
 Physician Discharge Summary  [name] FMW:[id number] DOB: Nov 05, 1945 DOA: 10/25/2023  PCP: Daryl Setter, NP  Admit date: 10/25/2023 Discharge date: 10/30/2023  Time spent: 35 minutes  Recommendations for Outpatient Follow-up:  Get Chem-12 magnesium  1 week and replace electrolytes as needed Get TSH in 3 weeks, iron studies in 3 weeks As needed Lasix  to resume as per orders-it is given only as needed for swelling Outpatient cardiology follow-up eventually CC Dr. Malvin regarding follow-up for wound care and management of amputation-complete antibiotics as per below orders-->final cultures will be followed by Dr. 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 Polly 09/08/2023 Chronic pain follows with Dr. Carilyn for spasticity gets Botox   Painful onychomycosis through nails 1 through 5 bilaterally follows with Dr. Norleen Binder podiatry   Came to ED 10/25/2023 worsening right foot redness swelling drainage purulent discharge right first toe Radiographs notable for diffuse soft tissue swelling  MRI showed suspicion for osteomyelitis distal phalanx great toe no septic arthritis and podiatry planned surgery 8/13 8/13 underwent amputation of right great toe through proximal phalanx neck Dr. Malvin podiatry   Plan    osteomyelitis right great hallux status post amputation right great through through proximal phalanx neck BC X2 NGTD-deep wound culture ? Growing 2 colonies ? Staph--final cult pending and will be followed in the outpatient setting Continues  Augmentin  1 tablet X5 days post procedure per Dr. Malvin Oxy IR 5-10 every 4 as needed severe pain,tramadol  held at d/c---Tylenol  650 prn Continue gabapentin  100 at bedtime   Prediabetes A1c 09/08/2023 5.8 CBGs ranging below 180 during hospital stay   Paroxysmal A-fib CHA2DS2-VASc >6 on Eliquis  and Tikosyn  Is maintained on Tikosyn  500 mcg twice daily continue Cardizem  180 daily XL K repleted aggressively in the hospital   Prior ischemic CVA  Eliquis  as above 5 twice daily    CAD HFpEF last echo 55-6% Lasix  40 as needed on hold during hospital stay and will resume in several days as as needed for swelling Continues Lotensin  15 mg daily   OSA failed CPAP with chr hypercapnia  Needs outpatient reeval   Gout episodic-continues allopurinol  100 twice daily   Chronic pain follows with Dr. Carilyn for spasticity Can follow as an outpatient with Dr. Carilyn for Botox  injections   Hypothyroid- Continue Synthroid  112 every morning-TSH TFT in several weeks   Mild thrombocytopenia Resolving and seems to have improved with the treatment of the infection  Discharge Exam: Vitals:   10/30/23 0403 10/30/23 0812  BP: 137/69 (!) 134/119  Pulse: 61 65  Resp: 16   Temp: 98 F (36.7 C) 97.9 F (36.6 C)  SpO2: 95% 96%    Subj on day of d/c   Looks well feels fair no distress No nausea no vomiting Ambulatory in the room No chest pain Much more aware coherent and ambulated about 20 feet with mobility  tech  General Exam on discharge  EOMI NCAT no focal deficit no icterus no pallor  no wheeze no rales no rhonchi Chest is clear no adventitious sounds Abdomen soft nontender no rebound no guarding No lower extremity edema right foot and  reinforce dressing with Darco boot on wound not examined   Discharge Instructions   Discharge Instructions     Diet - low sodium heart healthy   Complete by: As directed    Discharge wound care:   Complete by: As directed    Wound care as per podiatry keep wound wrapped and dry until follow-up with Dr. Malvin   Increase activity slowly   Complete by: As directed       Allergies as of 10/30/2023   No Known Allergies      Medication List     STOP taking these medications    dicyclomine  10 MG capsule Commonly known as: BENTYL    fluocinonide  gel 0.05 % Commonly known as: LIDEX    pantoprazole  40 MG tablet Commonly known as: PROTONIX    sucralfate  1 GM/10ML suspension Commonly known as: CARAFATE    traMADol  200 MG 24 hr tablet Commonly known as: ULTRAM -ER   traMADol  50 MG tablet Commonly known as: ULTRAM        TAKE these medications    acetaminophen  325 MG tablet Commonly known as: TYLENOL  Take 2 tablets (650 mg total) by mouth every 6 (six) hours as needed for mild pain (pain score 1-3) or fever (or Fever >/= 101).   allopurinol  100 MG tablet Commonly known as: ZYLOPRIM  TAKE 2 TABLETS BY MOUTH EVERY DAY   amoxicillin -clavulanate 875-125 MG tablet Commonly known as: AUGMENTIN  Take 1 tablet by mouth 2 (two) times daily for 4 days.   atorvastatin  40 MG tablet Commonly known as: LIPITOR  TAKE 1 TABLET BY MOUTH EVERY DAY   benazepril  10 MG tablet Commonly known as: LOTENSIN  Take 1 tablet (10 mg total) by mouth daily. What changed:  when to take this additional instructions Another medication with the same name was removed. Continue taking this medication, and follow the directions you see here.   diltiazem  180 MG 24 hr capsule Commonly known as: CARDIZEM  CD Take 1 capsule (180 mg total) by mouth daily.   dofetilide  500 MCG capsule Commonly known as: TIKOSYN  TAKE 1 CAPSULE (500 MCG TOTAL) BY MOUTH 2 (TWO) TIMES DAILY. PLEASE CALL [phone] TO  SCHEDULE A MARCH APPOINTMENT WITH DR. KLEIN FOR FUTURE REFILLS. THANK YOU.   Eliquis  5 MG Tabs tablet Generic drug: apixaban  TAKE 1 TABLET BY MOUTH TWICE A DAY   folic acid  1 MG tablet Commonly known as: FOLVITE  TAKE 1 TABLET BY MOUTH EVERY DAY   furosemide  40 MG tablet Commonly known as: LASIX  Take 1 tablet (40 mg total) by mouth daily as needed for fluid or edema. Start taking on: November 01, 2023 What changed: These instructions start on November 01, 2023. If you are unsure what to do until then, ask your doctor or other care provider.   gabapentin  100 MG capsule Commonly known as: NEURONTIN  Take 1 capsule (100 mg total) by mouth at bedtime.   Iron 325 (65 Fe) MG Tabs Take 65 mg by mouth daily.   levothyroxine  112 MCG tablet Commonly known as: SYNTHROID  TAKE 1 TABLET BY MOUTH DAILY BEFORE BREAKFAST.   oxyCODONE  5 MG immediate release tablet Commonly known as: Oxy IR/ROXICODONE  Take 1-2 tablets (5-10 mg total) by mouth every 4 (four) hours as needed for severe pain (pain score 7-10).  Discharge Care Instructions  (From admission, onward)           Start     Ordered   10/30/23 0000  Discharge wound care:       Comments: Wound care as per podiatry keep wound wrapped and dry until follow-up with Dr. Malvin   10/30/23 9072           No Known Allergies    The results of significant diagnostics from this hospitalization (including imaging, microbiology, ancillary and laboratory) are listed below for reference.    Significant Diagnostic Studies: DG Foot 2 Views Right Result Date: 10/28/2023 CLINICAL DATA:  Postop.  Toe amputation. EXAM: RIGHT FOOT - 2 VIEW COMPARISON:  10/25/2023 FINDINGS: Amputation of the great toe at the mid aspect of the proximal phalanx. The resection margin is smooth. Expected postoperative changes in the operative bed. Hammertoe deformity of the remaining toes. 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 Plantar calcaneal spur and  Achilles tendon enthesophyte. Overlying dressing in place. IMPRESSION: Amputation of the great toe at the mid aspect of the proximal phalanx. No immediate postoperative complication. Electronically Signed   By: Andrea Gasman M.D.   On: 10/28/2023 14:01   MR FOOT RIGHT WO CONTRAST Result Date: 10/26/2023 CLINICAL DATA:  Foot swelling, diabetic, osteomyelitis suspected, xray done According to recent admission note, severe right forefoot pain, erythema and purulent drainage from the 1st toe. EXAM: MRI OF THE RIGHT FOREFOOT WITHOUT CONTRAST TECHNIQUE: Multiplanar, multisequence MR imaging of the right forefoot was performed. No intravenous contrast was administered. COMPARISON:  Radiographs 10/08/2014 and 10/25/2023. FINDINGS: Bones/Joint/Cartilage The distal phalanx of the great toe demonstrates diffuse marrow abnormalities suspicious for osteomyelitis. There is diffusely increased signal on the T2 and inversion recovery images with mildly decreased T1 marrow signal. Possible mild cortical erosion medially. No involvement of the proximal phalanx identified. The additional toes and metatarsals appear intact. There are mild degenerative changes at the 1st metatarsophalangeal and interphalangeal joints. No significant joint effusions. Degenerative changes are also present at the Lisfranc joint with prominent subchondral cysts at the 1st tarsometatarsal joint. Ligaments Intact Lisfranc ligament. The collateral ligaments of the metatarsophalangeal joints appear intact. Muscles and Tendons The forefoot flexor and extensor tendons appear intact. Small amount of fluid within the flexor digitorum longus tendon sheath in the midfoot. Mild generalized muscular edema. Soft tissues Soft tissue swelling with skin thickening and subcutaneous edema in the great toe, greatest medially where there is possible skin ulceration. No evidence of organized fluid collection or foreign body. Mild generalized forefoot subcutaneous edema.  IMPRESSION: 1. Findings are suspicious for osteomyelitis of the distal phalanx of the great toe. No evidence of septic arthritis or osteomyelitis of the proximal phalanx. 2. Soft tissue swelling and subcutaneous edema in the great toe, greatest medially where there is possible skin ulceration. No evidence of abscess or foreign body. 3. Degenerative changes at the Lisfranc joint and 1st metatarsophalangeal and interphalangeal joints. Electronically Signed   By: Elsie Perone M.D.   On: 10/26/2023 16:25   DG Foot Complete Right Result Date: 10/25/2023 CLINICAL DATA:  red swollen low grade temp since this am EXAM: RIGHT FOOT COMPLETE - 3+ VIEW COMPARISON:  October 08, 2014 FINDINGS: Osteopenia. Hallux valgus deformity. 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 Moderate degenerative changes of the midfoot. No acute fracture or dislocation. There is no evidence of arthropathy or other focal bone abnormality. Diffuse soft tissue swelling about the foot. No radiopaque foreign body.  IMPRESSION: Diffuse soft tissue swelling about the foot. No radiographic findings of osteomyelitis. Electronically Signed   By: Rogelia Myers M.D.   On: 10/25/2023 17:41    Microbiology: Recent Results (from the past 240 hours)  Culture, blood (Routine x 2)     Status: None   Collection Time: 10/25/23  4:49 PM   Specimen: BLOOD  Result Value Ref Range Status   Specimen Description BLOOD RIGHT ANTECUBITAL  Final   Special Requests   Final    BOTTLES DRAWN AEROBIC AND ANAEROBIC Blood Culture adequate volume   Culture   Final    NO GROWTH 5 DAYS Performed at [facility], [address], [place], [place] [address]    Report Status 10/30/2023 FINAL  Final  Culture, blood (Routine x 2)     Status: None   Collection Time: 10/25/23  8:50 PM   Specimen: BLOOD  Result Value Ref Range Status   Specimen Description BLOOD BLOOD RIGHT HAND  Final    Special Requests   Final    BOTTLES DRAWN AEROBIC AND ANAEROBIC Blood Culture adequate volume   Culture   Final    NO GROWTH 5 DAYS Performed at [facility], [address] [place], [place] [address]    Report Status 10/30/2023 FINAL  Final  Aerobic/Anaerobic Culture w Gram Stain (surgical/deep wound)     Status: None (Preliminary result)   Collection Time: 10/28/23 12:22 PM   Specimen: PATH Bone biopsy; Tissue  Result Value Ref Range Status   Specimen Description BONE  Final   Special Requests R GREAT TOE BONE  Final   Gram Stain NO WBC SEEN NO ORGANISMS SEEN   Final   Culture   Final    CULTURE REINCUBATED FOR BETTER GROWTH Performed at [facility], [address] [place], [place] [address]    Report Status PENDING  Incomplete     Labs: Basic Metabolic Panel: Recent Labs  Lab 10/26/23 0316 10/27/23 0621 10/28/23 0456 10/29/23 0524 10/30/23 0407  NA 138 138 141 141 137  K 3.8 4.2 3.8 4.2 3.9  CL 103 102 105 107 102  CO2 26 28 29 28 27   GLUCOSE 113* 101* 116* 109* 119*  BUN 16 16 19 18 19   CREATININE 0.96 1.03 1.23 0.99 0.92  CALCIUM  8.7* 9.0 9.2 9.4 9.2  MG  --  2.2  --   --   --    Liver Function Tests: Recent Labs  Lab 10/25/23 1653  AST 45*  ALT 43  ALKPHOS 65  BILITOT 1.2  PROT 7.5  ALBUMIN 3.9   No results for input(s): LIPASE, AMYLASE in the last 168 hours. No results for input(s): AMMONIA in the last 168 hours. CBC: Recent Labs  Lab 10/25/23 1653 10/26/23 0316 10/27/23 0621 10/28/23 0456 10/29/23 0524 10/30/23 0407  WBC 9.3 7.8 5.7 5.6 6.6 6.6  NEUTROABS 6.6  --   --   --   --   --   HGB 13.0 11.2* 12.1* 12.2* 12.4* 12.8*  HCT 40.5 34.3* 37.3* 37.1* 37.6* 39.4  MCV 95.3 95.3 94.9 94.4 95.2 94.5  PLT 172 145* 147* 168 180 190   Cardiac Enzymes: No results for input(s): CKTOTAL, CKMB, CKMBINDEX, TROPONINI in the last 168 hours. BNP: BNP (last 3 results) No results for input(s): BNP in the last 8760  hours.  ProBNP (last 3 results) No results for input(s): PROBNP in the last 8760 hours.  CBG: Recent Labs  Lab 10/29/23 0634 10/29/23 0740 10/29/23 1156 10/29/23 1729 10/30/23 0813  GLUCAP 123* 106* 151* 95 129*    Signed:  Colen Grimes MD   [facility] 10/30/2023, 9:27 AM

## 2023-11-02 LAB — AEROBIC/ANAEROBIC CULTURE W GRAM STAIN (SURGICAL/DEEP WOUND): Gram Stain: NONE SEEN

## 2023-11-05 ENCOUNTER — Encounter: Payer: Self-pay | Admitting: Podiatry

## 2023-11-05 ENCOUNTER — Ambulatory Visit (INDEPENDENT_AMBULATORY_CARE_PROVIDER_SITE_OTHER): Admitting: Podiatry

## 2023-11-05 VITALS — BP 126/62 | HR 43

## 2023-11-05 DIAGNOSIS — B351 Tinea unguium: Secondary | ICD-10-CM

## 2023-11-05 DIAGNOSIS — M79675 Pain in left toe(s): Secondary | ICD-10-CM | POA: Diagnosis not present

## 2023-11-05 DIAGNOSIS — M869 Osteomyelitis, unspecified: Secondary | ICD-10-CM

## 2023-11-05 DIAGNOSIS — Z89411 Acquired absence of right great toe: Secondary | ICD-10-CM

## 2023-11-05 DIAGNOSIS — M79674 Pain in right toe(s): Secondary | ICD-10-CM | POA: Diagnosis not present

## 2023-11-05 NOTE — Progress Notes (Signed)
 Subjective:  Patient ID: [name], male    DOB: 13-Aug-1945,  MRN: [id number]  Chief Complaint  Patient presents with   Routine Post Op    POV#1 DOS: 10/28/2023 - Amputation right great toe through proximal phalanx neck. No pain. Some swelling and drainage. Pre diabetic. lipitor     DOS: 10/28/2023 Procedure: Right first toe partial amputation level of proximal phalanx  78 y.o. male returns for post-op check.  He reports doing well with pain.  Currently he is in rehab.  He has completed postop antibiotics.  He is also requesting assistance with painful, thickened mycotic toenails by causing pressure with direct pressure in shoe gear.  Patient is diabetic.  Review of Systems: Negative except as noted in the HPI. Denies N/V/F/Ch.  Past Medical History:  Diagnosis Date   Acute encephalopathy 12/12/2015   Acute respiratory failure with hypoxia and hypercapnia (HCC) 12/12/2015   Allergic rhinitis 02/13/2010   Qualifier: Diagnosis of  By: Georgian ROSALEA CHARM Lamar    Angina at rest [facility]) 10/22/2015   Arthritis    Ataxia 08/10/2013   Atrial fibrillation (HCC) 11/2018   B12 deficiency 08/11/2013   BACK PAIN, LUMBAR 01/29/2009   Qualifier: Diagnosis of  By: Georgian ROSALEA CHARM Lamar    BENIGN POSITIONAL VERTIGO 11/23/2009   Qualifier: Diagnosis of  By: Georgian ROSALEA CHARM Lamar    Chronic diastolic heart failure (HCC) 04/24/2015   Chronic pain    left sided-Kristeins   Chronically elevated hemidiaphragm    right   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/04/2015   s/p CABG 2004; CFX DES 2011   Elevated troponin    Essential hypertension 12/12/2006   Qualifier: Diagnosis of  By: Wilhemina RMA, Lucy     GERD 12/12/2006   Qualifier: Diagnosis of  By: Wilhemina RMA, Lucy     GOUT 12/12/2006   Qualifier: Diagnosis of  By: Wilhemina RMA, Lucy     Hemorrhoids    Hyperlipidemia LDL goal <70 12/12/2006   Qualifier: Diagnosis of  By: Wilhemina RMA, Lucy      Hypogonadism male 04/10/2011    Hypothyroidism, acquired 02/15/2013   Insomnia 05/21/2013   Long term current use of anticoagulant therapy 06/24/2011   Myocardial infarction (HCC)    Obstructive sleep apnea-Failed CPAP 06/17/2007   Failed CPAP Using O 2 for sleep    On home oxygen  therapy    2L at night for sleep apnea   Paroxysmal atrial fibrillation (HCC) 12/06/2016   Pre-diabetes    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; resultant hemiplegia of dominant side   Thoracic aorta atherosclerosis (HCC) 04/27/2017   Tubular adenoma of colon 05/2010   Vasovagal syncope    Vasovagal syncope    Wears glasses     Current Outpatient Medications:    acetaminophen  (TYLENOL ) 325 MG tablet, Take 2 tablets (650 mg total) by mouth every 6 (six) hours as needed for mild pain (pain score 1-3) or fever (or Fever >/= 101)., Disp: , Rfl:    allopurinol  (ZYLOPRIM ) 100 MG tablet, TAKE 2 TABLETS BY MOUTH EVERY DAY, Disp: 180 tablet, Rfl: 0   atorvastatin  (LIPITOR ) 40 MG tablet, TAKE 1 TABLET BY MOUTH EVERY DAY, Disp: 90 tablet, Rfl: 0   benazepril  (LOTENSIN ) 10 MG tablet, Take 1 tablet (10 mg total) by mouth daily. (Patient taking differently: Take 10 mg by mouth See admin instructions. Take with 5 mg for a total of 15 mg in the morning), Disp:  90 tablet, Rfl: 1   dofetilide  (TIKOSYN ) 500 MCG capsule, TAKE 1 CAPSULE (500 MCG TOTAL) BY MOUTH 2 (TWO) TIMES DAILY. PLEASE CALL [phone] TO SCHEDULE A MARCH APPOINTMENT WITH DR. KLEIN FOR FUTURE REFILLS. THANK YOU., Disp: 180 capsule, Rfl: 2   ELIQUIS  5 MG TABS tablet, TAKE 1 TABLET BY MOUTH TWICE A DAY, Disp: 180 tablet, Rfl: 1   Ferrous Sulfate  (IRON) 325 (65 Fe) MG TABS, Take 65 mg by mouth daily., Disp: , Rfl:    furosemide  (LASIX ) 40 MG tablet, Take 1 tablet (40 mg total) by mouth daily as needed for fluid or edema., Disp: , Rfl:    gabapentin  (NEURONTIN ) 100 MG capsule, Take 1 capsule (100 mg total) by mouth at bedtime., Disp: 90 capsule, Rfl: 0   levothyroxine  (SYNTHROID ) 112  MCG tablet, TAKE 1 TABLET BY MOUTH DAILY BEFORE BREAKFAST., Disp: 90 tablet, Rfl: 0   oxyCODONE  (OXY IR/ROXICODONE ) 5 MG immediate release tablet, Take 1-2 tablets (5-10 mg total) by mouth every 4 (four) hours as needed for severe pain (pain score 7-10)., Disp: 30 tablet, Rfl: 0   diltiazem  (CARDIZEM  CD) 180 MG 24 hr capsule, TAKE 1 CAPSULE BY MOUTH EVERY DAY, Disp: 30 capsule, Rfl: 0  Social History   Tobacco Use  Smoking Status Never  Smokeless Tobacco Never  Tobacco Comments   Never smoked 05/29/23    No Known Allergies Objective:   Vitals:   11/05/23 1106  BP: 126/62  Pulse: (!) 43   There is no height or weight on file to calculate BMI. Constitutional Well developed. Well nourished.  Vascular Foot warm and well perfused. Capillary refill normal to all digits.   Neurologic Normal speech. Oriented to person, place, and time. Light touch sensation diminished at the toes distally  Dermatologic Right first toe amputation site noted with sutures intact.  Skin healing well without signs of infection. Skin edges well coapted without signs of infection.  Orthopedic: Mild tenderness to palpation noted about the surgical site.   Radiographs: Status post interval amputation of right first toe with resection transversely through the proximal phalanx, base intact.  Surgical pathology: Positive for acute and chronic osteomyelitis of the right hallux with clean margins at resection site. Assessment:   1. Osteomyelitis of great toe of right foot (HCC)   2. History of amputation of right great toe [facility])    Plan:  Patient was evaluated and treated and all questions answered.  S/p foot surgery right first toe partial hallux amputation 10/28/2023 -Progressing as expected post-operatively. -XR: Reviewed with patient -WB Status: Weightbearing as tolerated in surgical shoe -Sutures: Left intact, anticipate removal of sutures in 1 to 2 weeks. -Medications: Pain well-controlled.  Has  completed postoperative course of antibiotics -Foot redressed.  #Onychomycosis with pain  -Nails palliatively debrided as below. -Educated on self-care  Procedure: Nail Debridement Rationale: Pain Type of Debridement: manual, sharp debridement. Instrumentation: Nail nipper, rotary burr. Number of Nails: 10   Return in about 1 week (around 11/12/2023) for Post Op Suture Removal.

## 2023-11-06 ENCOUNTER — Other Ambulatory Visit (HOSPITAL_COMMUNITY): Payer: Self-pay | Admitting: Physician Assistant

## 2023-11-08 ENCOUNTER — Encounter: Payer: Self-pay | Admitting: Podiatry

## 2023-11-12 ENCOUNTER — Encounter: Admitting: Podiatry

## 2023-11-13 DIAGNOSIS — I5030 Unspecified diastolic (congestive) heart failure: Secondary | ICD-10-CM | POA: Diagnosis not present

## 2023-11-13 DIAGNOSIS — S98111D Complete traumatic amputation of right great toe, subsequent encounter: Secondary | ICD-10-CM | POA: Diagnosis not present

## 2023-11-13 DIAGNOSIS — M869 Osteomyelitis, unspecified: Secondary | ICD-10-CM | POA: Diagnosis not present

## 2023-11-13 DIAGNOSIS — I480 Paroxysmal atrial fibrillation: Secondary | ICD-10-CM | POA: Diagnosis not present

## 2023-11-18 ENCOUNTER — Telehealth: Payer: Self-pay | Admitting: Family

## 2023-11-18 ENCOUNTER — Telehealth: Payer: Self-pay

## 2023-11-18 ENCOUNTER — Ambulatory Visit: Admitting: Family

## 2023-11-18 NOTE — Transitions of Care (Post Inpatient/ED Visit) (Unsigned)
   11/18/2023  Name: [name] MRN: [id number] DOB: January 26, 1946  Today's TOC FU Call Status: Today's TOC FU Call Status:: Unsuccessful Call (1st Attempt) Unsuccessful Call (1st Attempt) Date: 11/18/23  Attempted to reach the patient regarding the most recent Inpatient/ED visit.  Follow Up Plan: Additional outreach attempts will be made to reach the patient to complete the Transitions of Care (Post Inpatient/ED visit) call.   Signature Julian Lemmings, LPN [facility] Nurse Health Advisor Direct Dial [phone]

## 2023-11-18 NOTE — Telephone Encounter (Signed)
 Could you please send him a no show letter?

## 2023-11-18 NOTE — Telephone Encounter (Signed)
 No show letter mailed.

## 2023-11-19 ENCOUNTER — Encounter: Payer: Self-pay | Admitting: Podiatry

## 2023-11-19 ENCOUNTER — Ambulatory Visit (INDEPENDENT_AMBULATORY_CARE_PROVIDER_SITE_OTHER): Admitting: Podiatry

## 2023-11-19 ENCOUNTER — Telehealth: Payer: Self-pay | Admitting: Podiatry

## 2023-11-19 VITALS — Ht 75.0 in | Wt 244.0 lb

## 2023-11-19 DIAGNOSIS — M869 Osteomyelitis, unspecified: Secondary | ICD-10-CM

## 2023-11-19 DIAGNOSIS — Z89411 Acquired absence of right great toe: Secondary | ICD-10-CM | POA: Diagnosis not present

## 2023-11-19 NOTE — Transitions of Care (Post Inpatient/ED Visit) (Unsigned)
   11/19/2023  Name: [name] MRN: [id number] DOB: 1945-08-21  Today's TOC FU Call Status: Today's TOC FU Call Status:: Unsuccessful Call (2nd Attempt) Unsuccessful Call (1st Attempt) Date: 11/18/23 Unsuccessful Call (2nd Attempt) Date: 11/19/23  Attempted to reach the patient regarding the most recent Inpatient/ED visit.  Follow Up Plan: Additional outreach attempts will be made to reach the patient to complete the Transitions of Care (Post Inpatient/ED visit) call.   Signature Julian Lemmings, LPN [facility] Nurse Health Advisor Direct Dial [phone]

## 2023-11-19 NOTE — Patient Instructions (Signed)
 You may get your foot wet in the shower.  Recommend cleansing the surgical site with antibacterial soap and water.  Pat area dry gently.  Following this, paint the scab to the lateral portion of the incision with Betadine/Iodine solution.  Okay to leave open to air.  Monitor for any signs of wound formation.  Okay to return to regular shoe gear.

## 2023-11-19 NOTE — Progress Notes (Signed)
 Subjective:  Patient ID: [name], male    DOB: 12/20/1945,  MRN: [id number]  Chief Complaint  Patient presents with   Routine Post Op    Post Op Suture Removal.: POV#2 DOS: 10/28/2023 - Amputation right great toe through proximal phalanx neck. R great toe minimal drainage or redness.     DOS: 10/28/2023 Procedure: Right first toe partial amputation level of proximal phalanx  78 y.o. male returns for post-op check.  He reports doing well with pain.  He is currently back home from rehab.  Does report that did contract COVID while in rehab but is doing a bit better now today.  Review of Systems: Negative except as noted in the HPI. Denies N/V/F/Ch.  Past Medical History:  Diagnosis Date   Acute encephalopathy 12/12/2015   Acute respiratory failure with hypoxia and hypercapnia (HCC) 12/12/2015   Allergic rhinitis 02/13/2010   Qualifier: Diagnosis of  By: Georgian ROSALEA CHARM Lamar    Angina at rest [facility]) 10/22/2015   Arthritis    Ataxia 08/10/2013   Atrial fibrillation (HCC) 11/2018   B12 deficiency 08/11/2013   BACK PAIN, LUMBAR 01/29/2009   Qualifier: Diagnosis of  By: Georgian ROSALEA CHARM Lamar    BENIGN POSITIONAL VERTIGO 11/23/2009   Qualifier: Diagnosis of  By: Georgian ROSALEA CHARM Lamar    Chronic diastolic heart failure (HCC) 04/24/2015   Chronic pain    left sided-Kristeins   Chronically elevated hemidiaphragm    right   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/04/2015   s/p CABG 2004; CFX DES 2011   Elevated troponin    Essential hypertension 12/12/2006   Qualifier: Diagnosis of  By: Wilhemina RMA, Lucy     GERD 12/12/2006   Qualifier: Diagnosis of  By: Wilhemina RMA, Lucy     GOUT 12/12/2006   Qualifier: Diagnosis of  By: Wilhemina RMA, Lucy     Hemorrhoids    Hyperlipidemia LDL goal <70 12/12/2006   Qualifier: Diagnosis of  By: Wilhemina RMA, Lucy      Hypogonadism male 04/10/2011   Hypothyroidism, acquired 02/15/2013   Insomnia 05/21/2013   Long term  current use of anticoagulant therapy 06/24/2011   Myocardial infarction (HCC)    Obstructive sleep apnea-Failed CPAP 06/17/2007   Failed CPAP Using O 2 for sleep    On home oxygen  therapy    2L at night for sleep apnea   Paroxysmal atrial fibrillation (HCC) 12/06/2016   Pre-diabetes    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; resultant hemiplegia of dominant side   Thoracic aorta atherosclerosis (HCC) 04/27/2017   Tubular adenoma of colon 05/2010   Vasovagal syncope    Vasovagal syncope    Wears glasses     Current Outpatient Medications:    acetaminophen  (TYLENOL ) 325 MG tablet, Take 2 tablets (650 mg total) by mouth every 6 (six) hours as needed for mild pain (pain score 1-3) or fever (or Fever >/= 101)., Disp: , Rfl:    allopurinol  (ZYLOPRIM ) 100 MG tablet, TAKE 2 TABLETS BY MOUTH EVERY DAY, Disp: 180 tablet, Rfl: 0   atorvastatin  (LIPITOR ) 40 MG tablet, TAKE 1 TABLET BY MOUTH EVERY DAY, Disp: 90 tablet, Rfl: 0   benazepril  (LOTENSIN ) 10 MG tablet, Take 1 tablet (10 mg total) by mouth daily. (Patient taking differently: Take 10 mg by mouth See admin instructions. Take with 5 mg for a total of 15 mg in the morning), Disp: 90 tablet, Rfl: 1   diltiazem  (  CARDIZEM  CD) 180 MG 24 hr capsule, TAKE 1 CAPSULE BY MOUTH EVERY DAY, Disp: 30 capsule, Rfl: 0   dofetilide  (TIKOSYN ) 500 MCG capsule, TAKE 1 CAPSULE (500 MCG TOTAL) BY MOUTH 2 (TWO) TIMES DAILY. PLEASE CALL [phone] TO SCHEDULE A MARCH APPOINTMENT WITH DR. KLEIN FOR FUTURE REFILLS. THANK YOU., Disp: 180 capsule, Rfl: 2   ELIQUIS  5 MG TABS tablet, TAKE 1 TABLET BY MOUTH TWICE A DAY, Disp: 180 tablet, Rfl: 1   Ferrous Sulfate  (IRON) 325 (65 Fe) MG TABS, Take 65 mg by mouth daily., Disp: , Rfl:    furosemide  (LASIX ) 40 MG tablet, Take 1 tablet (40 mg total) by mouth daily as needed for fluid or edema., Disp: , Rfl:    gabapentin  (NEURONTIN ) 100 MG capsule, Take 1 capsule (100 mg total) by mouth at bedtime., Disp: 90 capsule, Rfl:  0   levothyroxine  (SYNTHROID ) 112 MCG tablet, TAKE 1 TABLET BY MOUTH DAILY BEFORE BREAKFAST., Disp: 90 tablet, Rfl: 0   oxyCODONE  (OXY IR/ROXICODONE ) 5 MG immediate release tablet, Take 1-2 tablets (5-10 mg total) by mouth every 4 (four) hours as needed for severe pain (pain score 7-10)., Disp: 30 tablet, Rfl: 0  Social History   Tobacco Use  Smoking Status Never  Smokeless Tobacco Never  Tobacco Comments   Never smoked 05/29/23    No Known Allergies Objective:   There were no vitals filed for this visit.  Body mass index is 30.5 kg/m. Constitutional Well developed. Well nourished.  Vascular Foot warm and well perfused. Capillary refill normal to all digits.   Neurologic Normal speech. Oriented to person, place, and time. Light touch sensation diminished at the toes distally  Dermatologic Right first toe amputation site noted with sutures intact.  Skin healing well without signs of infection.  Skin edges appear well-approximated well coapted.  The lateral portion the incision there is some eschar formation which appears stable.  Some callus around the anterior edge of this.  Orthopedic: Mild tenderness to palpation noted about the surgical site.   Radiographs: Status post interval amputation of right first toe with resection transversely through the proximal phalanx, base intact.  Surgical pathology: Positive for acute and chronic osteomyelitis of the right hallux with clean margins at resection site. Assessment:   1. History of amputation of right great toe (HCC)   2. Osteomyelitis of great toe of right foot (HCC)    Plan:  Patient was evaluated and treated and all questions answered.  S/p foot surgery right first toe partial hallux amputation 10/28/2023 -Progressing as expected post-operatively. -WB Status: Progressed to regular shoe gear as tolerated -Sutures: Sutures were removed today without incident.  There is some eschar formation laterally, underlying skin likely  appearing well-healed but will have patient follow-up in 2 weeks out of abundance of caution -Pain to the area of eschar with Betadine solution, instructed patient to do this daily.  May otherwise get the foot wet in the shower, wash with antibacterial soap and water and pat dry, apply the Betadine following this. -Medications: Pain well-controlled.  Has completed postoperative course of antibiotics    Return in about 2 weeks (around 12/03/2023) for Post Op Check.

## 2023-11-19 NOTE — Telephone Encounter (Signed)
 Tried to contact patient to confirm appointment cancellation on 9/9. Went ahead and cancelled appointment as it was cancelled through system.

## 2023-11-20 NOTE — Transitions of Care (Post Inpatient/ED Visit) (Signed)
   11/20/2023  Name: [name] MRN: [id number] DOB: Jul 23, 1945  Today's TOC FU Call Status: Today's TOC FU Call Status:: Unsuccessful Call (3rd Attempt) Unsuccessful Call (1st Attempt) Date: 11/18/23 Unsuccessful Call (2nd Attempt) Date: 11/19/23 Unsuccessful Call (3rd Attempt) Date: 11/20/23  Attempted to reach the patient regarding the most recent Inpatient/ED visit.  Follow Up Plan: No further outreach attempts will be made at this time. We have been unable to contact the patient.  Signature Julian Lemmings, LPN [facility] Nurse Health Advisor Direct Dial [phone]

## 2023-11-24 ENCOUNTER — Ambulatory Visit: Admitting: Podiatry

## 2023-11-30 DIAGNOSIS — E119 Type 2 diabetes mellitus without complications: Secondary | ICD-10-CM | POA: Diagnosis not present

## 2023-11-30 DIAGNOSIS — Z4781 Encounter for orthopedic aftercare following surgical amputation: Secondary | ICD-10-CM | POA: Diagnosis not present

## 2023-11-30 DIAGNOSIS — E782 Mixed hyperlipidemia: Secondary | ICD-10-CM | POA: Diagnosis not present

## 2023-11-30 DIAGNOSIS — I110 Hypertensive heart disease with heart failure: Secondary | ICD-10-CM | POA: Diagnosis not present

## 2023-11-30 DIAGNOSIS — Z7901 Long term (current) use of anticoagulants: Secondary | ICD-10-CM | POA: Diagnosis not present

## 2023-11-30 DIAGNOSIS — I5032 Chronic diastolic (congestive) heart failure: Secondary | ICD-10-CM | POA: Diagnosis not present

## 2023-11-30 DIAGNOSIS — Z8673 Personal history of transient ischemic attack (TIA), and cerebral infarction without residual deficits: Secondary | ICD-10-CM | POA: Diagnosis not present

## 2023-11-30 DIAGNOSIS — I480 Paroxysmal atrial fibrillation: Secondary | ICD-10-CM | POA: Diagnosis not present

## 2023-11-30 DIAGNOSIS — Z89411 Acquired absence of right great toe: Secondary | ICD-10-CM | POA: Diagnosis not present

## 2023-11-30 DIAGNOSIS — G8929 Other chronic pain: Secondary | ICD-10-CM | POA: Diagnosis not present

## 2023-11-30 DIAGNOSIS — J9612 Chronic respiratory failure with hypercapnia: Secondary | ICD-10-CM | POA: Diagnosis not present

## 2023-11-30 DIAGNOSIS — J9611 Chronic respiratory failure with hypoxia: Secondary | ICD-10-CM | POA: Diagnosis not present

## 2023-11-30 DIAGNOSIS — G4733 Obstructive sleep apnea (adult) (pediatric): Secondary | ICD-10-CM | POA: Diagnosis not present

## 2023-11-30 DIAGNOSIS — E039 Hypothyroidism, unspecified: Secondary | ICD-10-CM | POA: Diagnosis not present

## 2023-11-30 DIAGNOSIS — M109 Gout, unspecified: Secondary | ICD-10-CM | POA: Diagnosis not present

## 2023-11-30 DIAGNOSIS — M792 Neuralgia and neuritis, unspecified: Secondary | ICD-10-CM | POA: Diagnosis not present

## 2023-11-30 DIAGNOSIS — I2510 Atherosclerotic heart disease of native coronary artery without angina pectoris: Secondary | ICD-10-CM | POA: Diagnosis not present

## 2023-11-30 NOTE — Telephone Encounter (Signed)
Verbal orders given for PT. 

## 2023-12-03 ENCOUNTER — Ambulatory Visit (HOSPITAL_COMMUNITY)
Admission: RE | Admit: 2023-12-03 | Discharge: 2023-12-03 | Disposition: A | Discharge status: Home / Self Care | Source: Ambulatory Visit | Attending: Physician Assistant | Admitting: Physician Assistant

## 2023-12-03 ENCOUNTER — Encounter (HOSPITAL_COMMUNITY): Payer: Self-pay | Admitting: Physician Assistant

## 2023-12-03 VITALS — BP 148/82 | HR 80 | Ht 75.0 in | Wt 241.4 lb

## 2023-12-03 DIAGNOSIS — Z5181 Encounter for therapeutic drug level monitoring: Secondary | ICD-10-CM | POA: Diagnosis not present

## 2023-12-03 DIAGNOSIS — I4811 Longstanding persistent atrial fibrillation: Secondary | ICD-10-CM | POA: Insufficient documentation

## 2023-12-03 DIAGNOSIS — D6869 Other thrombophilia: Secondary | ICD-10-CM | POA: Insufficient documentation

## 2023-12-03 DIAGNOSIS — Z79899 Other long term (current) drug therapy: Secondary | ICD-10-CM | POA: Insufficient documentation

## 2023-12-03 DIAGNOSIS — I480 Paroxysmal atrial fibrillation: Secondary | ICD-10-CM | POA: Diagnosis not present

## 2023-12-03 NOTE — Progress Notes (Signed)
 Primary Care Physician: Daryl Setter, NP Primary Cardiologist: Dr Jeffrie Primary Electrophysiologist: Dr Fernande Referring Physician: Dr Fernande Fairy JULIANNA [name] is a 78 y.o. 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 Patient is on Eliquis  for stroke prevention. He was seen by Dr Fernande on 06/28/19 and dofetilide  was recommended. Patient is s/p dofetilide  loading 5/18-5/21/21. He converted with the medication and did not require DCCV. He did have some episodes of bradycardia and his diltiazem  was reduced.   Patient returns for follow up for atrial fibrillation and dofetilide  monitoring. He reports that he has done well from a cardiac standpoint with no interim afib episodes. He was hospitalized 10/2023 with osteomyelitis and had his right toe amputated. No bleeding issues on anticoagulation.   Today, he  denies symptoms of palpitations, chest pain, shortness of breath, orthopnea, PND, lower extremity edema, dizziness, presyncope, syncope, bleeding, or neurologic sequela. The patient is tolerating medications without difficulties and is otherwise without complaint today.    Atrial Fibrillation Risk Factors:  he does have symptoms or diagnosis of sleep apnea. Diagnosed remotely, did not tolerate CPAP.   Atrial Fibrillation Management history:  Previous antiarrhythmic drugs: amiodarone , dofetilide   Previous cardioversions: several, most recently 12/10/18 Previous ablations: none Anticoagulation history: Eliquis    Past Medical History:  Diagnosis Date   Acute encephalopathy 12/12/2015   Acute respiratory failure with hypoxia and hypercapnia (HCC) 12/12/2015   Allergic rhinitis 02/13/2010   Qualifier: Diagnosis of  By: Georgian ROSALEA CHARM Lamar    Angina at rest [facility]) 10/22/2015   Arthritis     Ataxia 08/10/2013   Atrial fibrillation (HCC) 11/2018   B12 deficiency 08/11/2013   BACK PAIN, LUMBAR 01/29/2009   Qualifier: Diagnosis of  By: Georgian ROSALEA CHARM Lamar    BENIGN POSITIONAL VERTIGO 11/23/2009   Qualifier: Diagnosis of  By: Georgian ROSALEA CHARM Lamar    Chronic diastolic heart failure (HCC) 04/24/2015   Chronic pain    left sided-Kristeins   Chronically elevated hemidiaphragm    right   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/04/2015   s/p CABG 2004; CFX DES 2011   Elevated troponin    Essential hypertension 12/12/2006   Qualifier: Diagnosis of  By: Wilhemina RMA, Lucy     GERD 12/12/2006   Qualifier: Diagnosis of  By: Wilhemina KRAFT, Lucy     GOUT 12/12/2006   Qualifier: Diagnosis of  By: Wilhemina RMA, Lucy     Hemorrhoids    Hyperlipidemia LDL goal <70 12/12/2006   Qualifier: Diagnosis of  By: Wilhemina RMA, Lucy      Hypogonadism male 04/10/2011   Hypothyroidism, acquired 02/15/2013   Insomnia 05/21/2013   Long term current use of anticoagulant therapy 06/24/2011   Myocardial infarction (HCC)    Obstructive sleep apnea-Failed CPAP 06/17/2007   Failed CPAP Using O 2 for sleep    On home oxygen  therapy    2L at night for sleep apnea   Paroxysmal atrial fibrillation (HCC) 12/06/2016   Pre-diabetes    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  Physical Exam: Vitals:   12/03/23 1129  BP: (!) 148/82  Pulse: 80  Weight: 109.5 kg  Height: 6' 3 (1.905 m)     GEN: Well nourished, well developed in no acute distress CARDIAC: Regular rate and rhythm, no murmurs, rubs, gallops RESPIRATORY:  Clear to auscultation without rales, wheezing or rhonchi  ABDOMEN: Soft, non-tender,  non-distended EXTREMITIES:  trace bilateral lower extremity edema, No deformity    Wt Readings from Last 3 Encounters:  12/03/23 109.5 kg  11/19/23 110.7 kg  10/28/23 111.1 kg    EKG today demonstrates  SR, PAC, LAFB Vent. rate 80 BPM PR interval 174 ms QRS duration 116 ms QT/QTcB 398/459 ms   Echo 02/18/18 demonstrated  - Left ventricle: The cavity size was normal. There was mild focal    basal hypertrophy of the septum. Systolic function was normal.    The estimated ejection fraction was in the range of 55% to 60%.    Wall motion was normal; there were no regional wall motion    abnormalities. Doppler parameters are consistent with abnormal    left ventricular relaxation (grade 1 diastolic dysfunction).    Doppler parameters are consistent with elevated ventricular    end-diastolic filling pressure.  - Mitral valve: Calcified annulus. Mildly thickened leaflets .    There was mild regurgitation.  - Left atrium: The atrium was mildly dilated.  - Right ventricle: The cavity size was mildly dilated. Wall    thickness was normal. Systolic function was mildly reduced.  - Right atrium: The atrium was normal in size.  - Tricuspid valve: There was mild regurgitation.  - Pulmonary arteries: Systolic pressure was within the normal    range.  - Inferior vena cava: The vessel was normal in size.  - Pericardium, extracardiac: There was no pericardial effusion.   Epic records are reviewed at length today   CHA2DS2-VASc Score = 8  The patient's score is based upon: CHF History: 1 HTN History: 1 Diabetes History: 1 Stroke History: 2 Vascular Disease History: 1 Age Score: 2 Gender Score: 0       ASSESSMENT AND PLAN: Longstanding Persistent Atrial Fibrillation (ICD10:  I48.11) The patient's CHA2DS2-VASc score is 8, indicating a 10.8% annual risk of stroke.   S/p dofetilide  loading 07/2019 Patient appears to be maintaining SR Continue dofetilide  500 mcg BID Continue Eliquis  5  mg BID Continue diltiazem  180 mg daily  Secondary Hypercoagulable State (ICD10:  D68.69) The patient is at significant risk for stroke/thromboembolism based upon his CHA2DS2-VASc Score of 8.  Continue Apixaban  (Eliquis ). No bleeding issues.   High Risk Medication Monitoring (ICD 10: [id number]) Patient requires ongoing monitoring for anti-arrhythmic medication which has the potential to cause life threatening arrhythmias. QT interval on ECG acceptable for dofetilide  monitoring. Check bmet/mag today.     CAD S/p CABG No anginal symptoms Followed by Dr Jeffrie  HTN Stable on current regimen  Chronic HFpEF EF 55-60% GDMT per primary cardiology team Fluid status appears stable today   Follow up in the AF clinic in 6 months. Overdue for follow up with primary cardiology team.    Daril Kicks PA-C [facility] [address] [place], [place] [address] [phone] 12/03/2023 11:50 AM

## 2023-12-04 ENCOUNTER — Encounter: Attending: Physical Medicine & Rehabilitation | Admitting: Physical Medicine & Rehabilitation

## 2023-12-04 ENCOUNTER — Encounter: Payer: Self-pay | Admitting: Physical Medicine & Rehabilitation

## 2023-12-04 ENCOUNTER — Ambulatory Visit (HOSPITAL_COMMUNITY): Payer: Self-pay | Admitting: Physician Assistant

## 2023-12-04 VITALS — BP 117/73 | HR 75 | Ht 75.0 in | Wt 242.0 lb

## 2023-12-04 DIAGNOSIS — I69352 Hemiplegia and hemiparesis following cerebral infarction affecting left dominant side: Secondary | ICD-10-CM | POA: Diagnosis not present

## 2023-12-04 DIAGNOSIS — G8110 Spastic hemiplegia affecting unspecified side: Secondary | ICD-10-CM | POA: Diagnosis not present

## 2023-12-04 LAB — BASIC METABOLIC PANEL WITH GFR
BUN/Creatinine Ratio: 14 (ref 10–24)
BUN: 12 mg/dL (ref 8–27)
CO2: 26 mmol/L (ref 20–29)
Calcium: 9.4 mg/dL (ref 8.6–10.2)
Chloride: 103 mmol/L (ref 96–106)
Creatinine, Ser: 0.86 mg/dL (ref 0.76–1.27)
Glucose: 91 mg/dL (ref 70–99)
Potassium: 5.1 mmol/L (ref 3.5–5.2)
Sodium: 143 mmol/L (ref 134–144)
eGFR: 88 mL/min/1.73 (ref >59)

## 2023-12-04 LAB — MAGNESIUM: Magnesium: 2.1 mg/dL (ref 1.6–2.3)

## 2023-12-04 MED ORDER — BUPRENORPHINE 7.5 MCG/HR TD PTWK: 1.0000 | MEDICATED_PATCH | TRANSDERMAL | 1 refills | Status: DC

## 2023-12-04 MED ORDER — SODIUM CHLORIDE (PF) 0.9 % IJ SOLN
6.0000 mL | Freq: Once | INTRAMUSCULAR | Status: AC
  Administered 2023-12-04: 6 mL

## 2023-12-04 MED ORDER — ONABOTULINUMTOXINA 100 UNITS IJ SOLR
300.0000 [IU] | Freq: Once | INTRAMUSCULAR | Status: AC
  Administered 2023-12-04: 300 [IU] via INTRAMUSCULAR

## 2023-12-04 NOTE — Patient Instructions (Addendum)
 Stop tramadol  ER Start Butrans  patch 7.5mg  change every week May take short acting 50mg  tramadol  as needed up to 2 times a day

## 2023-12-04 NOTE — Progress Notes (Signed)
Botox Injection for spasticity using needle EMG guidance  Dilution: 50 Units/ml Indication: Severe spasticity which interferes with ADL,mobility and/or  hygiene and is unresponsive to medication management and other conservative care Informed consent was obtained after describing risks and benefits of the procedure with the patient. This includes bleeding, bruising, infection, excessive weakness, or medication side effects. A REMS form is on file and signed. Needle: 27g 1" needle electrode Number of units per muscle LEFT UE Biceps50  LEFT LE  Medial Hamstrings250 All injections were done after obtaining appropriate EMG activity and after negative drawback for blood. The patient tolerated the procedure well. Post procedure instructions were given. A followup appointment was made.     

## 2023-12-08 DIAGNOSIS — I110 Hypertensive heart disease with heart failure: Secondary | ICD-10-CM | POA: Diagnosis not present

## 2023-12-08 DIAGNOSIS — J9612 Chronic respiratory failure with hypercapnia: Secondary | ICD-10-CM | POA: Diagnosis not present

## 2023-12-08 DIAGNOSIS — J9611 Chronic respiratory failure with hypoxia: Secondary | ICD-10-CM | POA: Diagnosis not present

## 2023-12-08 DIAGNOSIS — Z4781 Encounter for orthopedic aftercare following surgical amputation: Secondary | ICD-10-CM | POA: Diagnosis not present

## 2023-12-08 DIAGNOSIS — E119 Type 2 diabetes mellitus without complications: Secondary | ICD-10-CM | POA: Diagnosis not present

## 2023-12-08 DIAGNOSIS — I5032 Chronic diastolic (congestive) heart failure: Secondary | ICD-10-CM | POA: Diagnosis not present

## 2023-12-10 ENCOUNTER — Ambulatory Visit (INDEPENDENT_AMBULATORY_CARE_PROVIDER_SITE_OTHER): Admitting: Podiatry

## 2023-12-10 ENCOUNTER — Encounter: Payer: Self-pay | Admitting: Podiatry

## 2023-12-10 VITALS — Ht 75.0 in | Wt 242.0 lb

## 2023-12-10 DIAGNOSIS — Z89411 Acquired absence of right great toe: Secondary | ICD-10-CM

## 2023-12-10 DIAGNOSIS — I70209 Unspecified atherosclerosis of native arteries of extremities, unspecified extremity: Secondary | ICD-10-CM

## 2023-12-10 DIAGNOSIS — E1151 Type 2 diabetes mellitus with diabetic peripheral angiopathy without gangrene: Secondary | ICD-10-CM

## 2023-12-10 NOTE — Progress Notes (Signed)
 Subjective:  Patient ID: [name], male    DOB: Nov 11, 1945,  MRN: [id number]  Chief Complaint  Patient presents with   Routine Post Op    POV#3  DOS: 10/28/2023 - Amputation right great toe through proximal phalanx neck. R great toe. Incision closed. Minimal redness and swelling. Prediabetic A1c 5.8.      DOS: 10/28/2023 Procedure: Right first toe partial amputation level of proximal phalanx  78 y.o. male returns for post-op check.  Last visit the did have signs of delayed healing to the area, reports that this is doing well at this point and he denies any problems.  He is back home now he has facility.  Review of Systems: Negative except as noted in the HPI. Denies N/V/F/Ch.  Past Medical History:  Diagnosis Date   Acute encephalopathy 12/12/2015   Acute respiratory failure with hypoxia and hypercapnia (HCC) 12/12/2015   Allergic rhinitis 02/13/2010   Qualifier: Diagnosis of  By: Georgian ROSALEA CHARM Lamar    Angina at rest 10/22/2015   Arthritis    Ataxia 08/10/2013   Atrial fibrillation (HCC) 11/2018   B12 deficiency 08/11/2013   BACK PAIN, LUMBAR 01/29/2009   Qualifier: Diagnosis of  By: Georgian ROSALEA CHARM Lamar    BENIGN POSITIONAL VERTIGO 11/23/2009   Qualifier: Diagnosis of  By: Georgian ROSALEA CHARM Lamar    Chronic diastolic heart failure (HCC) 04/24/2015   Chronic pain    left sided-Kristeins   Chronically elevated hemidiaphragm    right   Coronary 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 11/04/2015   s/p CABG 2004; CFX DES 2011   Elevated troponin    Essential hypertension 12/12/2006   Qualifier: Diagnosis of  By: Wilhemina RMA, Lucy     GERD 12/12/2006   Qualifier: Diagnosis of  By: Wilhemina RMA, Lucy     GOUT 12/12/2006   Qualifier: Diagnosis of  By: Wilhemina RMA, Lucy     Hemorrhoids    Hyperlipidemia LDL goal <70 12/12/2006   Qualifier: Diagnosis of  By: Wilhemina RMA, Lucy      Hypogonadism male 04/10/2011   Hypothyroidism, acquired 02/15/2013   Insomnia 05/21/2013    Long term current use of anticoagulant therapy 06/24/2011   Myocardial infarction (HCC)    Obstructive sleep apnea-Failed CPAP 06/17/2007   Failed CPAP Using O 2 for sleep    On home oxygen  therapy    2L at night for sleep apnea   Paroxysmal atrial fibrillation (HCC) 12/06/2016   Pre-diabetes    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; resultant hemiplegia of dominant side   Thoracic aorta atherosclerosis 04/27/2017   Tubular adenoma of colon 05/2010   Vasovagal syncope    Vasovagal syncope    Wears glasses     Current Outpatient Medications:    acetaminophen  (TYLENOL ) 325 MG tablet, Take 2 tablets (650 mg total) by mouth every 6 (six) hours as needed for mild pain (pain score 1-3) or fever (or Fever >/= 101)., Disp: , Rfl:    allopurinol  (ZYLOPRIM ) 100 MG tablet, TAKE 2 TABLETS BY MOUTH EVERY DAY, Disp: 180 tablet, Rfl: 0   atorvastatin  (LIPITOR ) 40 MG tablet, TAKE 1 TABLET BY MOUTH EVERY DAY, Disp: 90 tablet, Rfl: 0   benazepril  (LOTENSIN ) 10 MG tablet, Take 1 tablet (10 mg total) by mouth daily. (Patient taking differently: Take 10 mg by mouth See admin instructions. Take with 5 mg for a total of 15 mg in the morning), Disp: 90 tablet, Rfl: 1  buprenorphine  (BUTRANS ) 7.5 MCG/HR, Place 1 patch onto the skin once a week., Disp: 4 patch, Rfl: 1   diltiazem  (CARDIZEM  CD) 180 MG 24 hr capsule, TAKE 1 CAPSULE BY MOUTH EVERY DAY, Disp: 30 capsule, Rfl: 0   dofetilide  (TIKOSYN ) 500 MCG capsule, TAKE 1 CAPSULE (500 MCG TOTAL) BY MOUTH 2 (TWO) TIMES DAILY. PLEASE CALL [phone] TO SCHEDULE A MARCH APPOINTMENT WITH DR. KLEIN FOR FUTURE REFILLS. THANK YOU., Disp: 180 capsule, Rfl: 2   ELIQUIS  5 MG TABS tablet, TAKE 1 TABLET BY MOUTH TWICE A DAY, Disp: 180 tablet, Rfl: 1   Ferrous Sulfate  (IRON) 325 (65 Fe) MG TABS, Take 65 mg by mouth daily., Disp: , Rfl:    furosemide  (LASIX ) 40 MG tablet, Take 1 tablet (40 mg total) by mouth daily as needed for fluid or edema., Disp: , Rfl:     gabapentin  (NEURONTIN ) 100 MG capsule, Take 1 capsule (100 mg total) by mouth at bedtime., Disp: 90 capsule, Rfl: 0   levothyroxine  (SYNTHROID ) 112 MCG tablet, TAKE 1 TABLET BY MOUTH DAILY BEFORE BREAKFAST., Disp: 90 tablet, Rfl: 0  Social History   Tobacco Use  Smoking Status Never  Smokeless Tobacco Never  Tobacco Comments   Never smoked 05/29/23    No Known Allergies Objective:   There were no vitals filed for this visit.  Body mass index is 30.25 kg/m. Constitutional Well developed. Well nourished.  Vascular Foot warm and well perfused. Capillary refill normal to all digits.   Neurologic Normal speech. Oriented to person, place, and time. Light touch sensation diminished at the toes distally  Dermatologic Right first toe partial amputation site appears well-healed at this point.  No evidence of dehiscence or gapping.  Well-healed cicatrix noted.  No open wounds or lesions noted.  Orthopedic: No tenderness to palpation noted about the surgical site.    Surgical pathology: Positive for acute and chronic osteomyelitis of the right hallux with clean margins at resection site. Assessment:   1. History of amputation of right great toe   2. 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  Right first toe amputation site appears well-healed at this point in time.  He can return to regular shoes as tolerated.  He should continue to monitor the site as well as toes of both feet for any signs of wound formation or developing infection.  Follow-up in about 2 months for diabetic footcare or sooner if new pedal complaints arise.    Return in about 9 weeks (around 02/11/2024) for Diabetic Foot Care.

## 2023-12-14 DIAGNOSIS — Z4781 Encounter for orthopedic aftercare following surgical amputation: Secondary | ICD-10-CM | POA: Diagnosis not present

## 2023-12-14 DIAGNOSIS — I110 Hypertensive heart disease with heart failure: Secondary | ICD-10-CM | POA: Diagnosis not present

## 2023-12-14 DIAGNOSIS — J9612 Chronic respiratory failure with hypercapnia: Secondary | ICD-10-CM | POA: Diagnosis not present

## 2023-12-14 DIAGNOSIS — E119 Type 2 diabetes mellitus without complications: Secondary | ICD-10-CM | POA: Diagnosis not present

## 2023-12-14 DIAGNOSIS — J9611 Chronic respiratory failure with hypoxia: Secondary | ICD-10-CM | POA: Diagnosis not present

## 2023-12-14 DIAGNOSIS — I5032 Chronic diastolic (congestive) heart failure: Secondary | ICD-10-CM | POA: Diagnosis not present

## 2023-12-18 ENCOUNTER — Other Ambulatory Visit: Payer: Self-pay | Admitting: Family

## 2023-12-18 ENCOUNTER — Other Ambulatory Visit (HOSPITAL_COMMUNITY): Payer: Self-pay | Admitting: *Deleted

## 2023-12-18 MED ORDER — DILTIAZEM HCL ER COATED BEADS 180 MG PO CP24: 180.0000 mg | ORAL_CAPSULE | Freq: Every day | ORAL | 2 refills | Status: AC

## 2023-12-18 MED ORDER — DOFETILIDE 500 MCG PO CAPS: 500.0000 ug | ORAL_CAPSULE | Freq: Two times a day (BID) | ORAL | 1 refills | Status: AC

## 2023-12-21 DIAGNOSIS — Z4781 Encounter for orthopedic aftercare following surgical amputation: Secondary | ICD-10-CM | POA: Diagnosis not present

## 2023-12-21 DIAGNOSIS — I5032 Chronic diastolic (congestive) heart failure: Secondary | ICD-10-CM | POA: Diagnosis not present

## 2023-12-21 DIAGNOSIS — I110 Hypertensive heart disease with heart failure: Secondary | ICD-10-CM | POA: Diagnosis not present

## 2023-12-21 DIAGNOSIS — E119 Type 2 diabetes mellitus without complications: Secondary | ICD-10-CM | POA: Diagnosis not present

## 2023-12-21 DIAGNOSIS — J9611 Chronic respiratory failure with hypoxia: Secondary | ICD-10-CM | POA: Diagnosis not present

## 2023-12-21 DIAGNOSIS — J9612 Chronic respiratory failure with hypercapnia: Secondary | ICD-10-CM | POA: Diagnosis not present

## 2023-12-23 ENCOUNTER — Other Ambulatory Visit: Payer: Self-pay | Admitting: Family

## 2023-12-28 DIAGNOSIS — I110 Hypertensive heart disease with heart failure: Secondary | ICD-10-CM | POA: Diagnosis not present

## 2023-12-28 DIAGNOSIS — J9611 Chronic respiratory failure with hypoxia: Secondary | ICD-10-CM | POA: Diagnosis not present

## 2023-12-28 DIAGNOSIS — I5032 Chronic diastolic (congestive) heart failure: Secondary | ICD-10-CM | POA: Diagnosis not present

## 2023-12-28 DIAGNOSIS — J9612 Chronic respiratory failure with hypercapnia: Secondary | ICD-10-CM | POA: Diagnosis not present

## 2023-12-28 DIAGNOSIS — E119 Type 2 diabetes mellitus without complications: Secondary | ICD-10-CM | POA: Diagnosis not present

## 2023-12-28 DIAGNOSIS — Z4781 Encounter for orthopedic aftercare following surgical amputation: Secondary | ICD-10-CM | POA: Diagnosis not present

## 2023-12-30 DIAGNOSIS — M792 Neuralgia and neuritis, unspecified: Secondary | ICD-10-CM | POA: Diagnosis not present

## 2023-12-30 DIAGNOSIS — Z4781 Encounter for orthopedic aftercare following surgical amputation: Secondary | ICD-10-CM | POA: Diagnosis not present

## 2023-12-30 DIAGNOSIS — E039 Hypothyroidism, unspecified: Secondary | ICD-10-CM | POA: Diagnosis not present

## 2023-12-30 DIAGNOSIS — I480 Paroxysmal atrial fibrillation: Secondary | ICD-10-CM | POA: Diagnosis not present

## 2023-12-30 DIAGNOSIS — I2510 Atherosclerotic heart disease of native coronary artery without angina pectoris: Secondary | ICD-10-CM | POA: Diagnosis not present

## 2023-12-30 DIAGNOSIS — Z7901 Long term (current) use of anticoagulants: Secondary | ICD-10-CM | POA: Diagnosis not present

## 2023-12-30 DIAGNOSIS — Z8673 Personal history of transient ischemic attack (TIA), and cerebral infarction without residual deficits: Secondary | ICD-10-CM | POA: Diagnosis not present

## 2023-12-30 DIAGNOSIS — I110 Hypertensive heart disease with heart failure: Secondary | ICD-10-CM | POA: Diagnosis not present

## 2023-12-30 DIAGNOSIS — E782 Mixed hyperlipidemia: Secondary | ICD-10-CM | POA: Diagnosis not present

## 2023-12-30 DIAGNOSIS — G4733 Obstructive sleep apnea (adult) (pediatric): Secondary | ICD-10-CM | POA: Diagnosis not present

## 2023-12-30 DIAGNOSIS — J9611 Chronic respiratory failure with hypoxia: Secondary | ICD-10-CM | POA: Diagnosis not present

## 2023-12-30 DIAGNOSIS — J9612 Chronic respiratory failure with hypercapnia: Secondary | ICD-10-CM | POA: Diagnosis not present

## 2023-12-30 DIAGNOSIS — M109 Gout, unspecified: Secondary | ICD-10-CM | POA: Diagnosis not present

## 2023-12-30 DIAGNOSIS — G8929 Other chronic pain: Secondary | ICD-10-CM | POA: Diagnosis not present

## 2023-12-30 DIAGNOSIS — Z89411 Acquired absence of right great toe: Secondary | ICD-10-CM | POA: Diagnosis not present

## 2023-12-30 DIAGNOSIS — E119 Type 2 diabetes mellitus without complications: Secondary | ICD-10-CM | POA: Diagnosis not present

## 2023-12-30 DIAGNOSIS — I5032 Chronic diastolic (congestive) heart failure: Secondary | ICD-10-CM | POA: Diagnosis not present

## 2023-12-31 ENCOUNTER — Telehealth: Payer: Self-pay | Admitting: Cardiology

## 2023-12-31 ENCOUNTER — Encounter: Payer: Self-pay | Admitting: Cardiology

## 2023-12-31 NOTE — Telephone Encounter (Signed)
 Patient contacted 3x with no success, will send letter.

## 2023-12-31 NOTE — Telephone Encounter (Signed)
-----   Message from Nurse Stacy C sent at 12/03/2023 11:59 AM EDT ----- Regarding: appt w skains/app Pt needs appt with dr skains/app as he is past due for general cardiology follow up per [facility]

## 2024-01-03 ENCOUNTER — Other Ambulatory Visit: Payer: Self-pay | Admitting: Family

## 2024-01-03 DIAGNOSIS — Z8639 Personal history of other endocrine, nutritional and metabolic disease: Secondary | ICD-10-CM

## 2024-01-03 NOTE — Telephone Encounter (Signed)
 Patient missed his last appointment.  Please call patient to schedule follow up visit.

## 2024-01-04 NOTE — Telephone Encounter (Signed)
 Can you try calling Pt please? Thank you.

## 2024-01-04 NOTE — Telephone Encounter (Signed)
 Called pt, unable to leave a voicemail. Also mychart is not active

## 2024-01-11 DIAGNOSIS — J9612 Chronic respiratory failure with hypercapnia: Secondary | ICD-10-CM | POA: Diagnosis not present

## 2024-01-11 DIAGNOSIS — I5032 Chronic diastolic (congestive) heart failure: Secondary | ICD-10-CM | POA: Diagnosis not present

## 2024-01-11 DIAGNOSIS — Z4781 Encounter for orthopedic aftercare following surgical amputation: Secondary | ICD-10-CM | POA: Diagnosis not present

## 2024-01-11 DIAGNOSIS — J9611 Chronic respiratory failure with hypoxia: Secondary | ICD-10-CM | POA: Diagnosis not present

## 2024-01-11 DIAGNOSIS — I110 Hypertensive heart disease with heart failure: Secondary | ICD-10-CM | POA: Diagnosis not present

## 2024-01-11 DIAGNOSIS — E119 Type 2 diabetes mellitus without complications: Secondary | ICD-10-CM | POA: Diagnosis not present

## 2024-01-12 ENCOUNTER — Encounter: Payer: Self-pay | Admitting: Podiatry

## 2024-01-12 ENCOUNTER — Other Ambulatory Visit: Payer: Self-pay | Admitting: Family

## 2024-01-12 ENCOUNTER — Ambulatory Visit: Admitting: Podiatry

## 2024-01-12 DIAGNOSIS — I10 Essential (primary) hypertension: Secondary | ICD-10-CM

## 2024-01-12 DIAGNOSIS — M79672 Pain in left foot: Secondary | ICD-10-CM | POA: Diagnosis not present

## 2024-01-12 DIAGNOSIS — M79671 Pain in right foot: Secondary | ICD-10-CM

## 2024-01-12 DIAGNOSIS — B351 Tinea unguium: Secondary | ICD-10-CM | POA: Diagnosis not present

## 2024-01-12 NOTE — Progress Notes (Signed)
 Patient presents for evaluation and treatment of tenderness and some redness around nails feet.  Tenderness around toes with walking and wearing shoes.  Physical exam:  General appearance: Alert, pleasant, and in no acute distress.  Vascular: Pedal pulses: DP 2/4 B/L, PT 0/4 B/L. Moderate edema lower legs bilaterally  Neurologic:  Dermatologic:  Nails thickened, disfigured, discolored 1-5 BL with subungual debris except hallux right.  Redness and hypertrophic nail folds along nail folds bilaterally but no signs of drainage or infection.  Musculoskeletal:  Status post amputation partial hallux right   Diagnosis: 1. 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 2. Pain toes 1 through 5 bilaterally.  Plan: -Debrided onychomycotic nails 1 through 5 bilaterally, except hallux right.  Sharply debrided nails with nail clipper and reduced with a power bur. -Dispensed silicone sleeve for second toe right  Return 3 months [facility]

## 2024-01-15 ENCOUNTER — Ambulatory Visit (INDEPENDENT_AMBULATORY_CARE_PROVIDER_SITE_OTHER): Admitting: Family

## 2024-01-15 ENCOUNTER — Encounter: Payer: Self-pay | Admitting: Family

## 2024-01-15 VITALS — BP 119/64 | HR 76 | Temp 98.8°F | Resp 16 | Ht 75.0 in | Wt 243.0 lb

## 2024-01-15 DIAGNOSIS — E039 Hypothyroidism, unspecified: Secondary | ICD-10-CM

## 2024-01-15 DIAGNOSIS — I10 Essential (primary) hypertension: Secondary | ICD-10-CM

## 2024-01-15 DIAGNOSIS — Z8739 Personal history of other diseases of the musculoskeletal system and connective tissue: Secondary | ICD-10-CM | POA: Diagnosis not present

## 2024-01-15 DIAGNOSIS — E7800 Pure hypercholesterolemia, unspecified: Secondary | ICD-10-CM

## 2024-01-15 DIAGNOSIS — E1140 Type 2 diabetes mellitus with diabetic neuropathy, unspecified: Secondary | ICD-10-CM | POA: Diagnosis not present

## 2024-01-15 DIAGNOSIS — E538 Deficiency of other specified B group vitamins: Secondary | ICD-10-CM | POA: Diagnosis not present

## 2024-01-15 DIAGNOSIS — I5032 Chronic diastolic (congestive) heart failure: Secondary | ICD-10-CM

## 2024-01-15 DIAGNOSIS — R350 Frequency of micturition: Secondary | ICD-10-CM | POA: Diagnosis not present

## 2024-01-15 DIAGNOSIS — I480 Paroxysmal atrial fibrillation: Secondary | ICD-10-CM

## 2024-01-15 DIAGNOSIS — Z23 Encounter for immunization: Secondary | ICD-10-CM

## 2024-01-15 DIAGNOSIS — D649 Anemia, unspecified: Secondary | ICD-10-CM | POA: Diagnosis not present

## 2024-01-15 DIAGNOSIS — E118 Type 2 diabetes mellitus with unspecified complications: Secondary | ICD-10-CM

## 2024-01-15 MED ORDER — ALLOPURINOL 100 MG PO TABS: 200.0000 mg | ORAL_TABLET | Freq: Every day | ORAL | 1 refills | Status: AC

## 2024-01-15 MED ORDER — BENAZEPRIL HCL 5 MG PO TABS: 5.0000 mg | ORAL_TABLET | Freq: Every day | ORAL | 1 refills | Status: AC

## 2024-01-15 MED ORDER — ATORVASTATIN CALCIUM 40 MG PO TABS: 40.0000 mg | ORAL_TABLET | Freq: Every day | ORAL | 1 refills | Status: DC

## 2024-01-15 NOTE — Assessment & Plan Note (Signed)
Update CBC today. 

## 2024-01-15 NOTE — Patient Instructions (Signed)
 VISIT SUMMARY:  Today, we addressed your abdominal pain, urinary issues, and reviewed your current medications. We also checked several important health markers and administered your flu shot.  YOUR PLAN:  TYPE 2 DIABETES MELLITUS: Your blood sugar levels are well-controlled. -We checked your A1c level.  PERIPHERAL NEUROPATHY: You have numbness in your feet due to neuropathy. -Continue taking gabapentin  at bedtime.  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 -Continue taking tramadol  and gabapentin . -Await the [place]rans  patch.  RIGHT GREAT TOE PARTIAL AMPUTATION, STATUS POST INFECTION: You had a partial amputation of your right great toe due to an infection.  BLADDER DYSFUNCTION: You have frequent urination and a sensation of incomplete bladder emptying. -We referred you to a urologist for further evaluation.  HYPOTHYROIDISM: You are on Synthroid  for thyroid  management. -We checked your thyroid  levels.  VITAMIN B12 DEFICIENCY: You take B12 supplements. -We checked your B12 levels.  PURE HYPERCHOLESTEROLEMIA: Your cholesterol levels have not been checked recently. -We checked your cholesterol levels.  ANEMIA: Your iron levels have not been checked recently. -We checked your iron levels.  ESSENTIAL HYPERTENSION: You are taking benazepril  for blood pressure control.  GENERAL HEALTH MAINTENANCE: You are up to date on your pneumonia vaccination and declined the COVID vaccination. -We administered your flu shot.

## 2024-01-15 NOTE — Assessment & Plan Note (Signed)
 Lab Results  Component Value Date   TSH 2.34 11/26/2022   Maintained on synthroid , will update TSH.

## 2024-01-15 NOTE — Assessment & Plan Note (Signed)
Check b12 level  

## 2024-01-15 NOTE — Assessment & Plan Note (Signed)
 Lab Results  Component Value Date   CHOL 156 05/27/2022   HDL 43.20 05/27/2022   LDLCALC 75 05/27/2022   TRIG 186.0 (H) 05/27/2022   CHOLHDL 4 05/27/2022   Last lipid panel stable but due for follow up on lipitor  40mg .

## 2024-01-15 NOTE — Assessment & Plan Note (Signed)
 Rate controlled on tikosyn , diltiazem . Continues  eliquis .

## 2024-01-15 NOTE — Assessment & Plan Note (Signed)
 Lab Results  Component Value Date   HGBA1C 5.8 (H) 09/08/2023   HGBA1C 6.2 11/26/2022   HGBA1C 6.1 05/27/2022   Lab Results  Component Value Date   MICROALBUR 0.50 03/22/2012   LDLCALC 75 05/27/2022   CREATININE 0.86 12/03/2023   Update A1C- diet controlled.

## 2024-01-15 NOTE — Assessment & Plan Note (Signed)
Clinically euvolemic 

## 2024-01-15 NOTE — Progress Notes (Signed)
 Subjective:     Patient ID: [name], male    DOB: 19-Nov-1945, 78 y.o.   MRN: [id number]  Chief Complaint  Patient presents with   Diabetes    Here for follow up    Diabetes    Discussed the use of AI scribe software for clinical note transcription with the patient, who gave verbal consent to proceed.  History of Present Illness  [name] is an 78 year old male with diabetes who presents with abdominal pain and urinary issues.  He experiences significant abdominal pain, particularly after meals. Dietary modifications have not alleviated his symptoms. He denies constipation, and bowel movements remain regular. He reports urinary urgency and frequency, with a sensation of incomplete bladder emptying.  Current medications include metformin , B12 supplements, gabapentin  at bedtime for neuropathy-related numbness in his feet, tramadol  for pain management, Synthroid  for thyroid  management, and a combination of benazepril  5 mg and 10 mg for blood pressure control. He is on a new pain management patch, Butrans , prescribed by another doctor, but has not yet received it.     Health Maintenance Due  Topic Date Due   Diabetic kidney evaluation - Urine ACR  03/22/2013   Medicare Annual Wellness (AWV)  05/21/2017   COVID-19 Vaccine (3 - Pfizer risk series) 06/29/2019    Past Medical History:  Diagnosis Date   2019 novel coronavirus disease (COVID-19) 11/25/2020   Acute encephalopathy 12/12/2015   Acute respiratory failure with hypoxia and hypercapnia (HCC) 12/12/2015   Allergic rhinitis 02/13/2010   Qualifier: Diagnosis of  By: Georgian ROSALEA CHARM Lamar    Angina at rest 10/22/2015   Arthritis    Ataxia 08/10/2013   Atrial fibrillation (HCC) 11/2018   B12 deficiency 08/11/2013   BACK PAIN, LUMBAR 01/29/2009   Qualifier: Diagnosis of  By: Georgian ROSALEA CHARM Lamar    BENIGN POSITIONAL VERTIGO 11/23/2009   Qualifier: Diagnosis of  By: Georgian ROSALEA CHARM Lamar    Chronic diastolic heart  failure (HCC) 97/92/7982   Chronic pain    left sided-Kristeins   Chronically elevated hemidiaphragm    right   Coronary 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 11/04/2015   s/p CABG 2004; CFX DES 2011   Elevated troponin    Essential hypertension 12/12/2006   Qualifier: Diagnosis of  By: Wilhemina RMA, Lucy     GERD 12/12/2006   Qualifier: Diagnosis of  By: Wilhemina KRAFT, Lucy     GOUT 12/12/2006   Qualifier: Diagnosis of  By: Wilhemina RMA, Lucy     Hemorrhoids    Hyperlipidemia LDL goal <70 12/12/2006   Qualifier: Diagnosis of  By: Wilhemina RMA, Lucy      Hypogonadism male 04/10/2011   Hypothyroidism, acquired 02/15/2013   Insomnia 05/21/2013   Long term current use of anticoagulant therapy 06/24/2011   Myocardial infarction (HCC)    Obstructive sleep apnea-Failed CPAP 06/17/2007   Failed CPAP Using O 2 for sleep    On home oxygen  therapy    2L at night for sleep apnea   Osteomyelitis of great toe of right foot (HCC) 10/27/2023   Paroxysmal atrial fibrillation (HCC) 12/06/2016   Pre-diabetes    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; resultant hemiplegia of dominant side   Thoracic aorta atherosclerosis 04/27/2017   Tubular adenoma of colon 05/2010   Vasovagal syncope    Vasovagal syncope    Wears glasses     Past Surgical History:  Procedure Laterality Date  AMPUTATION TOE Right 10/28/2023   Procedure: AMPUTATION, TOE;  Surgeon: Malvin Marsa FALCON, DPM;  Location: [facility] OR;  Service: Orthopedics/Podiatry;  Laterality: Right;  R great toe amp partial vs total   CARDIAC CATHETERIZATION N/A 10/25/2015   Procedure: Left Heart Cath and Cors/Grafts Angiography;  Surgeon: Debby DELENA Sor, MD;  Location: [facility] INVASIVE CV LAB;  Service: Cardiovascular;  Laterality: N/A;   CARDIOVERSION  06/20/2011   Procedure: CARDIOVERSION;  Surgeon: Ezra GORMAN Shuck, MD;  Location: [facility] ENDOSCOPY;  Service: Cardiovascular;  Laterality: N/A;   CARDIOVERSION N/A 04/26/2015   Procedure:  CARDIOVERSION;  Surgeon: Leim VEAR Moose, MD;  Location: [facility] ENDOSCOPY;  Service: Cardiovascular;  Laterality: N/A;   CARDIOVERSION N/A 05/10/2015   Procedure: CARDIOVERSION;  Surgeon: Ezra GORMAN Shuck, MD;  Location: [facility] ENDOSCOPY;  Service: Cardiovascular;  Laterality: N/A;   CARDIOVERSION N/A 12/10/2018   Procedure: CARDIOVERSION;  Surgeon: Francyne Headland, MD;  Location: [facility] ENDOSCOPY;  Service: Cardiovascular;  Laterality: N/A;   CARPOMETACARPAL (CMC) FUSION OF THUMB Right 07/18/2022   Procedure: right carpo metacarpal suspensionplasty with abductor tendon transfer right wrist stenosing tenosynovitis release, right index carpo metacarpal boss excision;  Surgeon: Sissy Cough, MD;  Location: [facility] OR;  Service: Orthopedics;  Laterality: Right;   CATARACT EXTRACTION  05/2010   left eye   CATARACT EXTRACTION  04/2010   right eye   COLONOSCOPY WITH PROPOFOL  N/A 02/22/2020   Procedure: COLONOSCOPY WITH PROPOFOL ;  Surgeon: San Sandor GAILS, DO;  Location: [facility] ENDOSCOPY;  Service: Gastroenterology;  Laterality: N/A;   CORONARY ARTERY BYPASS GRAFT     stent   DORSAL COMPARTMENT RELEASE Right 07/18/2022   Procedure: RELEASE DORSAL COMPARTMENT (DEQUERVAIN);  Surgeon: Sissy Cough, MD;  Location: [facility] OR;  Service: Orthopedics;  Laterality: Right;  Anethesia Axillary block/ MAC   ESOPHAGOGASTRODUODENOSCOPY  03-25-2005   EXCISION OF BACK LESION N/A 09/08/2023   Procedure: EXCISION, LESION, BACK;  Surgeon: Polly Cordella DELENA, MD;  Location: [facility] ORS;  Service: General;  Laterality: N/A;  EXCISION OF LESION UPPER MID BACK   HEMORRHOID SURGERY N/A 09/05/2021   Procedure: HEMORRHOIDECTOMY WITH LIGATION AND HEMORRHOIDOPEXY;  Surgeon: Sheldon Standing, MD;  Location: [facility] ORS;  Service: General;  Laterality: N/A;   LEFT HEART CATH AND CORS/GRAFTS ANGIOGRAPHY N/A 02/19/2018   Procedure: LEFT HEART CATH AND CORS/GRAFTS ANGIOGRAPHY;  Surgeon: Jordan, Peter M, MD;  Location: [facility] INVASIVE CV LAB;  Service: Cardiovascular;   Laterality: N/A;   NASAL SEPTUM SURGERY     PERCUTANEOUS PLACEMENT INTRAVASCULAR STENT CERVICAL CAROTID ARTERY     03-2009; using a drug-eluting platform of the circumflex cornoray artery with a 3.0 x 18 [employer] Promus drug-eluting platform post dilated to 3.75 with a noncompliant balloon.   POLYPECTOMY  02/22/2020   Procedure: POLYPECTOMY;  Surgeon: San Sandor GAILS, DO;  Location: [facility] ENDOSCOPY;  Service: Gastroenterology;;   RECTAL EXAM UNDER ANESTHESIA N/A 09/05/2021   Procedure: ANORECTAL EXAM UNDER ANESTHESIA;  Surgeon: Sheldon Standing, MD;  Location: [facility] ORS;  Service: General;  Laterality: N/A;   TEE WITHOUT CARDIOVERSION  06/20/2011   Procedure: TRANSESOPHAGEAL ECHOCARDIOGRAM (TEE);  Surgeon: Ezra GORMAN Shuck, MD;  Location: [facility] ENDOSCOPY;  Service: Cardiovascular;  Laterality: N/A;    Family History  Problem Relation Age of Onset   Stroke Mother        deceased-MINISTROKES   Lung cancer Father        deceased   Heart disease Father    Colon cancer Neg Hx    Esophageal cancer Neg Hx  Liver disease Neg Hx     Social History   Socioeconomic History   Marital status: Married    Spouse name: [name]   Number of children: 5   Years of education: 14   Highest education level: Not on file  Occupational History   Occupation: retired    Associate Professor: OTHER    Comment: physicist, medical  Tobacco Use   Smoking status: Never   Smokeless tobacco: Never   Tobacco comments:    Never smoked 05/29/23  Vaping Use   Vaping status: Never Used  Substance and Sexual Activity   Alcohol use: Not Currently   Drug use: No   Sexual activity: Not Currently  Other Topics Concern   Not on file  Social History Narrative   Pt lives with wife. Does have stairs, but patient doesn't use them. Pt has completed technical school   Social Drivers of Health   Financial Resource Strain: Not on file  Food Insecurity: No Food Insecurity (10/25/2023)   Hunger Vital Sign    Worried About Running Out  of Food in the Last Year: Never true    Ran Out of Food in the Last Year: Never true  Transportation Needs: No Transportation Needs (10/25/2023)   PRAPARE - Administrator, Civil Service (Medical): No    Lack of Transportation (Non-Medical): No  Physical Activity: Not on file  Stress: Not on file  Social Connections: Moderately Integrated (10/25/2023)   Social Connection and Isolation Panel    Frequency of Communication with Friends and Family: More than three times a week    Frequency of Social Gatherings with Friends and Family: Twice a week    Attends Religious Services: 1 to 4 times per year    Active Member of [employer] or Organizations: No    Attends Banker Meetings: Never    Marital Status: Married  Catering Manager Violence: Not At Risk (10/25/2023)   Humiliation, Afraid, Rape, and Kick questionnaire    Fear of Current or Ex-Partner: No    Emotionally Abused: No    Physically Abused: No    Sexually Abused: No    Outpatient Medications Prior to Visit  Medication Sig Dispense Refill   traMADol  (ULTRAM ) 50 MG tablet Take 50 mg by mouth 2 (two) times daily.     acetaminophen  (TYLENOL ) 325 MG tablet Take 2 tablets (650 mg total) by mouth every 6 (six) hours as needed for mild pain (pain score 1-3) or fever (or Fever >/= 101).     buprenorphine  (BUTRANS ) 7.5 MCG/HR Place 1 patch onto the skin once a week. 4 patch 1   diltiazem  (CARDIZEM  CD) 180 MG 24 hr capsule Take 1 capsule (180 mg total) by mouth daily. 90 capsule 2   dofetilide  (TIKOSYN ) 500 MCG capsule Take 1 capsule (500 mcg total) by mouth 2 (two) times daily. 180 capsule 1   ELIQUIS  5 MG TABS tablet TAKE 1 TABLET BY MOUTH TWICE A DAY 180 tablet 1   furosemide  (LASIX ) 40 MG tablet Take 1 tablet (40 mg total) by mouth daily as needed for fluid or edema.     gabapentin  (NEURONTIN ) 100 MG capsule TAKE 1 CAPSULE BY MOUTH AT BEDTIME. 90 capsule 0   levothyroxine  (SYNTHROID ) 112 MCG tablet TAKE 1 TABLET BY MOUTH  EVERY DAY BEFORE BREAKFAST 90 tablet 0   allopurinol  (ZYLOPRIM ) 100 MG tablet TAKE 2 TABLETS BY MOUTH EVERY DAY 180 tablet 0   atorvastatin  (LIPITOR ) 40 MG tablet TAKE 1 TABLET  BY MOUTH EVERY DAY 90 tablet 0   benazepril  (LOTENSIN ) 10 MG tablet Take 1 tablet (10 mg total) by mouth daily. (Patient taking differently: Take 10 mg by mouth See admin instructions. Take with 5 mg for a total of 15 mg in the morning) 90 tablet 1   Ferrous Sulfate  (IRON) 325 (65 Fe) MG TABS Take 65 mg by mouth daily.     No facility-administered medications prior to visit.    No Known Allergies  ROS     Objective:    Physical Exam Constitutional:      General: He is not in acute distress.    Appearance: He is well-developed.  HENT:     Head: Normocephalic and atraumatic.  Cardiovascular:     Rate and Rhythm: Normal rate and regular rhythm.     Heart sounds: No murmur heard. Pulmonary:     Effort: Pulmonary effort is normal. No respiratory distress.     Breath sounds: Normal breath sounds. No wheezing or rales.  Skin:    General: Skin is warm and dry.  Neurological:     Mental Status: He is alert and oriented to person, place, and time.  Psychiatric:        Behavior: Behavior normal.        Thought Content: Thought content normal.      BP 119/64 (BP Location: Right Arm, Patient Position: Sitting, Cuff Size: Large)   Pulse 76   Temp 98.8 F (37.1 C) (Oral)   Resp 16   Ht 6' 3 (1.905 m)   Wt 243 lb (110.2 kg)   SpO2 97%   BMI 30.37 kg/m  Wt Readings from Last 3 Encounters:  01/18/24 242 lb 15.2 oz (110.2 kg)  01/15/24 243 lb (110.2 kg)  12/10/23 242 lb (109.8 kg)       Assessment & Plan:   Problem List Items Addressed This Visit       Unprioritized   Paroxysmal atrial fibrillation (HCC)   Rate controlled on tikosyn , diltiazem . Continues  eliquis .       Relevant Medications   atorvastatin  (LIPITOR ) 40 MG tablet   benazepril  (LOTENSIN ) 5 MG tablet   Normocytic anemia   Update  CBC today.       Relevant Orders   CBC w/Diff (Completed)   Iron, TIBC and Ferritin Panel (Completed)   Hypothyroidism   Lab Results  Component Value Date   TSH 2.34 11/26/2022   Maintained on synthroid , will update TSH.       Relevant Orders   TSH (Completed)   Hyperlipidemia   Lab Results  Component Value Date   CHOL 156 05/27/2022   HDL 43.20 05/27/2022   LDLCALC 75 05/27/2022   TRIG 186.0 (H) 05/27/2022   CHOLHDL 4 05/27/2022   Last lipid panel stable but due for follow up on lipitor  40mg .       Relevant Medications   atorvastatin  (LIPITOR ) 40 MG tablet   benazepril  (LOTENSIN ) 5 MG tablet   History of gout   No recent flares, continues allopurinol .       Relevant Medications   allopurinol  (ZYLOPRIM ) 100 MG tablet   Chronic diastolic CHF (congestive heart failure) (HCC)   Clinically euvolemic.        Relevant Medications   atorvastatin  (LIPITOR ) 40 MG tablet   benazepril  (LOTENSIN ) 5 MG tablet   B12 deficiency   Check b12 level.      Relevant Orders   B12 (Completed)   Other Visit Diagnoses  Needs flu shot    -  Primary   Relevant Orders   Flu vaccine HIGH DOSE PF(Fluzone Trivalent) (Completed)     Controlled type 2 diabetes mellitus with neuropathy (HCC)       Relevant Medications   atorvastatin  (LIPITOR ) 40 MG tablet   benazepril  (LOTENSIN ) 5 MG tablet   Other Relevant Orders   HgB A1c (Completed)     Primary hypertension       Relevant Medications   atorvastatin  (LIPITOR ) 40 MG tablet   benazepril  (LOTENSIN ) 5 MG tablet     Urinary frequency       Relevant Orders   Ambulatory referral to Urology       I have changed Fairy FALCON. Loh's allopurinol  and atorvastatin . I am also having him start on benazepril . Additionally, I am having him maintain his Eliquis , acetaminophen , furosemide , buprenorphine , diltiazem , dofetilide , gabapentin , levothyroxine , and traMADol .  Meds ordered this encounter  Medications   allopurinol  (ZYLOPRIM ) 100  MG tablet    Sig: Take 2 tablets (200 mg total) by mouth daily.    Dispense:  180 tablet    Refill:  1    Supervising Provider:   DOMENICA BLACKBIRD A [[id number]]   atorvastatin  (LIPITOR ) 40 MG tablet    Sig: Take 1 tablet (40 mg total) by mouth daily.    Dispense:  90 tablet    Refill:  1    Supervising Provider:   DOMENICA BLACKBIRD A [[id number]]   benazepril  (LOTENSIN ) 5 MG tablet    Sig: Take 1 tablet (5 mg total) by mouth daily.    Dispense:  90 tablet    Refill:  1    Supervising Provider:   DOMENICA BLACKBIRD A [[id number]]

## 2024-01-15 NOTE — Assessment & Plan Note (Signed)
 No recent flares, continues allopurinol .

## 2024-01-16 LAB — CBC WITH DIFFERENTIAL/PLATELET
Absolute Lymphocytes: 1967 {cells}/uL (ref 850–3900)
Absolute Monocytes: 548 {cells}/uL (ref 200–950)
Basophils Absolute: 92 {cells}/uL (ref 0–200)
Basophils Relative: 1.4 %
Eosinophils Absolute: 330 {cells}/uL (ref 15–500)
Eosinophils Relative: 5 %
HCT: 40.5 % (ref 38.5–50.0)
Hemoglobin: 13.6 g/dL (ref 13.2–17.1)
MCH: 30.6 pg (ref 27.0–33.0)
MCHC: 33.6 g/dL (ref 32.0–36.0)
MCV: 91.2 fL (ref 80.0–100.0)
MPV: 11.2 fL (ref 7.5–12.5)
Monocytes Relative: 8.3 %
Neutro Abs: 3663 {cells}/uL (ref 1500–7800)
Neutrophils Relative %: 55.5 %
Platelets: 171 Thousand/uL (ref 140–400)
RBC: 4.44 Million/uL (ref 4.20–5.80)
RDW: 12.9 % (ref 11.0–15.0)
Total Lymphocyte: 29.8 %
WBC: 6.6 Thousand/uL (ref 3.8–10.8)

## 2024-01-16 LAB — HEMOGLOBIN A1C
Hgb A1c MFr Bld: 5.8 % — ABNORMAL HIGH (ref <5.7)
Mean Plasma Glucose: 120 mg/dL
eAG (mmol/L): 6.6 mmol/L

## 2024-01-16 LAB — TSH: TSH: 1.56 m[IU]/L (ref 0.40–4.50)

## 2024-01-16 LAB — IRON,TIBC AND FERRITIN PANEL
%SAT: 21 % (ref 20–48)
Ferritin: 421 ng/mL — ABNORMAL HIGH (ref 24–380)
Iron: 68 ug/dL (ref 50–180)
TIBC: 323 ug/dL (ref 250–425)

## 2024-01-16 LAB — VITAMIN B12: Vitamin B-12: 1006 pg/mL (ref 200–1100)

## 2024-01-18 ENCOUNTER — Emergency Department (HOSPITAL_COMMUNITY)
Admission: EM | Admit: 2024-01-18 | Discharge: 2024-01-18 | Disposition: A | Discharge status: Home / Self Care | Attending: Emergency Medicine | Admitting: Emergency Medicine

## 2024-01-18 ENCOUNTER — Telehealth: Payer: Self-pay

## 2024-01-18 ENCOUNTER — Other Ambulatory Visit: Payer: Self-pay

## 2024-01-18 ENCOUNTER — Other Ambulatory Visit: Payer: Self-pay | Admitting: Family

## 2024-01-18 ENCOUNTER — Emergency Department (HOSPITAL_COMMUNITY)

## 2024-01-18 ENCOUNTER — Ambulatory Visit: Payer: Self-pay | Admitting: Family

## 2024-01-18 DIAGNOSIS — Z043 Encounter for examination and observation following other accident: Secondary | ICD-10-CM | POA: Diagnosis not present

## 2024-01-18 DIAGNOSIS — Z8639 Personal history of other endocrine, nutritional and metabolic disease: Secondary | ICD-10-CM

## 2024-01-18 DIAGNOSIS — S80212A Abrasion, left knee, initial encounter: Secondary | ICD-10-CM | POA: Diagnosis not present

## 2024-01-18 DIAGNOSIS — E7800 Pure hypercholesterolemia, unspecified: Secondary | ICD-10-CM

## 2024-01-18 DIAGNOSIS — I6782 Cerebral ischemia: Secondary | ICD-10-CM | POA: Diagnosis not present

## 2024-01-18 DIAGNOSIS — W19XXXA Unspecified fall, initial encounter: Secondary | ICD-10-CM | POA: Insufficient documentation

## 2024-01-18 DIAGNOSIS — S40812A Abrasion of left upper arm, initial encounter: Secondary | ICD-10-CM | POA: Diagnosis not present

## 2024-01-18 DIAGNOSIS — S0990XA Unspecified injury of head, initial encounter: Secondary | ICD-10-CM

## 2024-01-18 DIAGNOSIS — S0083XA Contusion of other part of head, initial encounter: Secondary | ICD-10-CM | POA: Diagnosis not present

## 2024-01-18 DIAGNOSIS — R220 Localized swelling, mass and lump, head: Secondary | ICD-10-CM | POA: Diagnosis not present

## 2024-01-18 DIAGNOSIS — S50312A Abrasion of left elbow, initial encounter: Secondary | ICD-10-CM | POA: Insufficient documentation

## 2024-01-18 DIAGNOSIS — M47812 Spondylosis without myelopathy or radiculopathy, cervical region: Secondary | ICD-10-CM | POA: Diagnosis not present

## 2024-01-18 DIAGNOSIS — S0003XA Contusion of scalp, initial encounter: Secondary | ICD-10-CM | POA: Diagnosis not present

## 2024-01-18 DIAGNOSIS — M19022 Primary osteoarthritis, left elbow: Secondary | ICD-10-CM | POA: Diagnosis not present

## 2024-01-18 DIAGNOSIS — M1712 Unilateral primary osteoarthritis, left knee: Secondary | ICD-10-CM | POA: Diagnosis not present

## 2024-01-18 DIAGNOSIS — M4802 Spinal stenosis, cervical region: Secondary | ICD-10-CM | POA: Diagnosis not present

## 2024-01-18 DIAGNOSIS — T148XXA Other injury of unspecified body region, initial encounter: Secondary | ICD-10-CM

## 2024-01-18 DIAGNOSIS — S0993XA Unspecified injury of face, initial encounter: Secondary | ICD-10-CM | POA: Diagnosis not present

## 2024-01-18 DIAGNOSIS — D509 Iron deficiency anemia, unspecified: Secondary | ICD-10-CM

## 2024-01-18 DIAGNOSIS — Z7901 Long term (current) use of anticoagulants: Secondary | ICD-10-CM | POA: Insufficient documentation

## 2024-01-18 MED ORDER — BENAZEPRIL HCL 10 MG PO TABS: 10.0000 mg | ORAL_TABLET | Freq: Every day | ORAL | 0 refills | Status: AC

## 2024-01-18 MED ORDER — IRON 325 (65 FE) MG PO TABS: 325.0000 mg | ORAL_TABLET | ORAL | 1 refills | Status: AC

## 2024-01-18 NOTE — ED Provider Notes (Signed)
 [place] EMERGENCY DEPARTMENT AT [facility] Provider Note   CSN: [id number] Arrival date & time: 01/18/24  2120     Patient presents with: Fall (fot)   [name] is a 78 y.o. male.   HPI   78 year old male presents emergency department as a level 2 trauma, fall on blood thinners.  Patient was reportedly outside around 3 PM when he reportedly had a fall with noted head trauma.  The patient does not have memory of this fall.  He also has abrasions of the left arm and left knee.  The wife found him when she got home at 430 with these injuries.  Patient was doing well for a couple hours when they noted facial/head swelling they brought him in for evaluation.  Patient is on Eliquis  twice a day, did take his morning and night dose today.  Has otherwise been in his usual state of health.  Denies any pain.  Prior to Admission medications   Medication Sig Start Date End Date Taking? Authorizing Provider  acetaminophen  (TYLENOL ) 325 MG tablet Take 2 tablets (650 mg total) by mouth every 6 (six) hours as needed for mild pain (pain score 1-3) or fever (or Fever >/= 101). 10/30/23   Samtani, Jai-Gurmukh, MD  allopurinol  (ZYLOPRIM ) 100 MG tablet Take 2 tablets (200 mg total) by mouth daily. 01/15/24   O'Sullivan, Melissa, NP  atorvastatin  (LIPITOR ) 40 MG tablet Take 1 tablet (40 mg total) by mouth daily. 01/15/24   O'Sullivan, Melissa, NP  benazepril  (LOTENSIN ) 10 MG tablet Take 1 tablet (10 mg total) by mouth daily. Take with 5mg  tablet to equal 15mg  daily 01/18/24   O'Sullivan, Melissa, NP  benazepril  (LOTENSIN ) 5 MG tablet Take 1 tablet (5 mg total) by mouth daily. 01/15/24   O'Sullivan, Melissa, NP  buprenorphine  (BUTRANS ) 7.5 MCG/HR Place 1 patch onto the skin once a week. 12/04/23   Kirsteins, Prentice BRAVO, MD  diltiazem  (CARDIZEM  CD) 180 MG 24 hr capsule Take 1 capsule (180 mg total) by mouth daily. 12/18/23   Fenton, Clint R, PA  dofetilide  (TIKOSYN ) 500 MCG capsule Take 1 capsule  (500 mcg total) by mouth 2 (two) times daily. 12/18/23   Fenton, Clint R, PA  ELIQUIS  5 MG TABS tablet TAKE 1 TABLET BY MOUTH TWICE A DAY 06/17/23   Jeffrie Oneil BROCKS, MD  Ferrous Sulfate  (IRON) 325 (65 Fe) MG TABS Take 1 tablet (325 mg total) by mouth every other day. 01/18/24   O'Sullivan, Melissa, NP  furosemide  (LASIX ) 40 MG tablet Take 1 tablet (40 mg total) by mouth daily as needed for fluid or edema. 11/01/23   Samtani, Jai-Gurmukh, MD  gabapentin  (NEURONTIN ) 100 MG capsule TAKE 1 CAPSULE BY MOUTH AT BEDTIME. 12/23/23   O'Sullivan, Melissa, NP  levothyroxine  (SYNTHROID ) 112 MCG tablet TAKE 1 TABLET BY MOUTH EVERY DAY BEFORE BREAKFAST 01/03/24   O'Sullivan, Melissa, NP  traMADol  (ULTRAM ) 50 MG tablet Take 50 mg by mouth 2 (two) times daily.    [provider]    Allergies: Patient has no known allergies.    Review of Systems  Constitutional:  Negative for fever.  Respiratory:  Negative for shortness of breath.   Cardiovascular:  Negative for chest pain.  Gastrointestinal:  Negative for abdominal pain, diarrhea and vomiting.  Musculoskeletal:        + L knee and L elbow abrasion  Skin:  Negative for rash.  Neurological:  Positive for headaches.    Updated Vital Signs BP (!) 150/80  Pulse 84   Temp 99.2 F (37.3 C)   Resp 20   Ht 6' 3 (1.905 m)   Wt 110.2 kg   SpO2 98%   BMI 30.37 kg/m   Physical Exam Vitals and nursing note reviewed.  Constitutional:      Appearance: Normal appearance.  HENT:     Head: Normocephalic.     Comments: Left frontal hematoma    Mouth/Throat:     Mouth: Mucous membranes are moist.  Eyes:     Extraocular Movements: Extraocular movements intact.     Pupils: Pupils are equal, round, and reactive to light.  Cardiovascular:     Rate and Rhythm: Normal rate.  Pulmonary:     Effort: Pulmonary effort is normal. No respiratory distress.  Abdominal:     Palpations: Abdomen is soft.     Tenderness: There is no abdominal tenderness.   Musculoskeletal:     Comments: Abrasions to left elbow and left knee  Skin:    General: Skin is warm.  Neurological:     Mental Status: He is alert and oriented to person, place, and time. Mental status is at baseline.  Psychiatric:        Mood and Affect: Mood normal.     (all labs ordered are listed, but only abnormal results are displayed) Labs Reviewed - No data to display  EKG: None  Radiology: No results found.   .Critical Care  Performed by: Bari Roxie HERO, DO Authorized by: Bari Roxie HERO, DO   Critical care provider statement:    Critical care time (minutes):  30   Critical care time was exclusive of:  Separately billable procedures and treating other patients   Critical care was necessary to treat or prevent imminent or life-threatening deterioration of the following conditions:  Trauma   Critical care was time spent personally by me on the following activities:  Development of treatment plan with patient or surrogate, discussions with consultants, evaluation of patient's response to treatment, examination of patient, ordering and review of laboratory studies, ordering and review of radiographic studies, ordering and performing treatments and interventions, pulse oximetry, re-evaluation of patient's condition and review of old charts   I assumed direction of critical care for this patient from another provider in my specialty: no      Medications Ordered in the ED - No data to display                                  Medical Decision Making Amount and/or Complexity of Data Reviewed Radiology: ordered.   78 year old male presents emergency department as a level 2 trauma, fall with head injury on blood thinners.  Fall was a couple hours prior to arrival.  Patient did take his morning and night dose of Eliquis .  Vitals are normal and stable.  He has bruising and hematoma to the left frontal as well as superficial abrasions to the left elbow and left knee.   He denies any acute complaints.  He has baseline and neuro intact.  CT of the head, face and cervical spine showed no acute finding.  X-rays of the left elbow and knee showed no acute fracture.  On reevaluation patient continues to have no complaints and is baseline.  Will plan for outpatient follow-up.  Patient at this time appears safe and stable for discharge and close outpatient follow up. Discharge plan and strict return to ED precautions discussed,  patient verbalizes understanding and agreement.     Final diagnoses:  None    ED Discharge Orders     None          Bari Roxie HERO, DO 01/18/24 2254

## 2024-01-18 NOTE — ED Triage Notes (Signed)
 Pt had fall at approx 1600 at unknown place and unk if mechanical fall or not. Pt has hematoma to left forehead and is on thinners. Family stated that [place] saw him fall. Pt does not remember fall.

## 2024-01-18 NOTE — Telephone Encounter (Signed)
 Copied from CRM [id number]. Topic: Referral - Status >> Jan 18, 2024  9:18 AM Carlyon D wrote: Reason for CRM: Pt is asking about a status update for urology. He stated some one told him Friday they would get this taken care of. I do not see a referral still for urology please reach out to pt.

## 2024-01-18 NOTE — Telephone Encounter (Signed)
 Levothyroxine  refilled on 01/03/24 for 90 tablets, gabapentin  100mg  refilled on 12/23/23 for 90 capsules, diltiazem  180mg  is prescribed by cardiology, atorvastatin  40mg  refilled on 01/15/24 for 90 tablets and 1 refill, Tikosyn  is prescribed by cardiology and Butrans  7.5mcg/hr patch is prescribed by Dr. Carilyn

## 2024-01-18 NOTE — Discharge Instructions (Signed)
 You have been seen and discharged from the emergency department.  Your head, face and neck CT showed no acute finding.  The x-rays of your elbow and knee were normal.  Continue to take your home medication as prescribed.  You may take Tylenol  for pain control.  Follow-up with your primary provider for further evaluation and further care. Take home medications as prescribed. If you have any worsening symptoms or further concerns for your health please return to an emergency department for further evaluation.

## 2024-01-18 NOTE — ED Notes (Signed)
 Pt brought back from triage as level 2 FOT, family at bedside. Pt unable to recall falling. Pt transported to ct on monitor by primary RN. EDP called to bedside upon pt arrival to room.

## 2024-01-18 NOTE — Telephone Encounter (Signed)
 Referral has been placed.

## 2024-01-18 NOTE — Progress Notes (Signed)
   01/18/24 2134  Spiritual Encounters  Type of Visit Initial  Care provided to: Family  Referral source Trauma page  Reason for visit Trauma  OnCall Visit No  Advance Directives (For Healthcare)  Does Patient Have a Medical Advance Directive? No  Mental Health Advance Directives  Does Patient Have a Mental Health Advance Directive? No   Chaplain responded to a level two trauma. I met with the patient's spouse, [name] and her grand daughter.. Both shared that they felt ok at the present time. [name] appreciated that her grand  daughter accompanied her. I advised them if they wished to speak with a chaplain at a later time to let their nurse know. Someone will respond to the page.   [name]  [phone]

## 2024-01-18 NOTE — Telephone Encounter (Signed)
 Copied from CRM [id number]. Topic: Clinical - Medication Refill >> Jan 18, 2024  9:10 AM Carlyon D wrote: Medication: levothyroxine  (SYNTHROID ) 112 MCG tablet  benazepril  (LOTENSIN ) 5 MG tablet  gabapentin  (NEURONTIN ) 100 MG capsule   atorvastatin  (LIPITOR ) 40 MG tablet  diltiazem  (CARDIZEM  CD) 180 MG 24 hr capsule  Ferrous Sulfate  (IRON) 325 (65 Fe) MG TABS  dofetilide  (TIKOSYN ) 500 MCG capsule  Butrans  patch- pt would like patch sent over as he stopped taking tramadol .   Has the patient contacted their pharmacy? No (Agent: If no, request that the patient contact the pharmacy for the refill. If patient does not wish to contact the pharmacy document the reason why and proceed with request.) (Agent: If yes, when and what did the pharmacy advise?)  This is the patient's preferred pharmacy:  [id number] [place], [place] - [address] [place] [address] Phone: [phone] Fax: [phone]  Is this the correct pharmacy for this prescription? Yes If no, delete pharmacy and type the correct one.   Has the prescription been filled recently? No  Is the patient out of the medication? Yes  Has the patient been seen for an appointment in the last year OR does the patient have an upcoming appointment? Yes  Can we respond through MyChart? No, prefers phone call.   Agent: Please be advised that Rx refills may take up to 3 business days. We ask that you follow-up with your pharmacy.

## 2024-01-19 ENCOUNTER — Ambulatory Visit: Admitting: Podiatry

## 2024-01-19 ENCOUNTER — Ambulatory Visit

## 2024-01-19 DIAGNOSIS — L03031 Cellulitis of right toe: Secondary | ICD-10-CM

## 2024-01-19 DIAGNOSIS — M2041 Other hammer toe(s) (acquired), right foot: Secondary | ICD-10-CM | POA: Diagnosis not present

## 2024-01-19 MED ORDER — CEPHALEXIN 500 MG PO CAPS: 500.0000 mg | ORAL_CAPSULE | Freq: Four times a day (QID) | ORAL | 0 refills | Status: AC

## 2024-01-19 NOTE — Progress Notes (Signed)
 Subjective: Chief Complaint  Patient presents with   Nail Problem    Rm12 Pt says right 2nd toe and foot is red and from rfc.   78 year old male presents the office today above concerns.  He states that he just had his toenails trimmed last week and during the debridement he felt there was some bleeding.  Recently started has increased swelling redness mostly to the second toe he is concerned about infection and the antibiotics.  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  He is concerned about further bone infection or need for amputation.  Currently does not report any fevers or chills.  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  There is no drainage or pus identified today.  There is edema and erythema present to the second digit without any fluctuation or crepitation.  There is no open lesions.  No wound to the nailbed. No pain with calf compression, swelling, warmth, erythema Uses a walker.  Assessment: Cellulitis right second toe  Plan: -All treatment options discussed with the patient including all alternatives, risks, complications.  -X-rays obtained reviewed.  Multiple views obtained.  I do not appreciate any changes compared to prior x-rays consistent with osteomyelitis.  Digital contracture present. -Prescribe cephalexin  500 mg 4 times a day -Recommend surgical shoe for offloading -Elevation -Monitor for any clinical signs or symptoms of infection and directed to call the office immediately should any occur or go to the ER. -Patient encouraged to call the office with any questions, concerns, change in symptoms.   Return in about 1 week (around 01/26/2024) for toe infection.  Donnice JONELLE Fees DPM

## 2024-01-22 ENCOUNTER — Telehealth: Payer: Self-pay

## 2024-01-24 ENCOUNTER — Other Ambulatory Visit: Payer: Self-pay | Admitting: Family

## 2024-01-25 ENCOUNTER — Ambulatory Visit: Payer: Self-pay

## 2024-01-25 DIAGNOSIS — J9612 Chronic respiratory failure with hypercapnia: Secondary | ICD-10-CM | POA: Diagnosis not present

## 2024-01-25 DIAGNOSIS — I110 Hypertensive heart disease with heart failure: Secondary | ICD-10-CM | POA: Diagnosis not present

## 2024-01-25 DIAGNOSIS — I5032 Chronic diastolic (congestive) heart failure: Secondary | ICD-10-CM | POA: Diagnosis not present

## 2024-01-25 DIAGNOSIS — E119 Type 2 diabetes mellitus without complications: Secondary | ICD-10-CM | POA: Diagnosis not present

## 2024-01-25 DIAGNOSIS — J9611 Chronic respiratory failure with hypoxia: Secondary | ICD-10-CM | POA: Diagnosis not present

## 2024-01-25 DIAGNOSIS — Z4781 Encounter for orthopedic aftercare following surgical amputation: Secondary | ICD-10-CM | POA: Diagnosis not present

## 2024-01-25 NOTE — Telephone Encounter (Signed)
 Caller/Agency: [facility] , [name] Number: [phone] Service Requested: Physical Therapy Frequency: continue phyical therapy once a  week for 4 weeks Any new concerns about the patient? Yes Patient fell November 2, both eyes have bruised and a hurt knee  Verbal orders given. Patient was seen at ED after the reported fall

## 2024-01-25 NOTE — Telephone Encounter (Signed)
 HH nurse Elveria states that she already spoke to staff about verbal orders and fall.   Copied from CRM [id number]. Topic: Clinical - Red Word Triage >> Jan 25, 2024  3:30 PM [name] wrote: [facility] that prompted transfer to Nurse Triage: Elveria Sheerer Jesus home health nurse called to give verbal orders and also stated the patient fell November 2nd, went to er and still have 2 back eyes and a banged up knee,  [place] phone number [phone]

## 2024-01-25 NOTE — Telephone Encounter (Signed)
 Okay to give verbal orders?  ?

## 2024-01-26 ENCOUNTER — Ambulatory Visit (INDEPENDENT_AMBULATORY_CARE_PROVIDER_SITE_OTHER): Admitting: Podiatry

## 2024-01-26 ENCOUNTER — Encounter: Payer: Self-pay | Admitting: Podiatry

## 2024-01-26 DIAGNOSIS — L03031 Cellulitis of right toe: Secondary | ICD-10-CM

## 2024-01-26 DIAGNOSIS — M2041 Other hammer toe(s) (acquired), right foot: Secondary | ICD-10-CM

## 2024-01-26 NOTE — Progress Notes (Signed)
 Subjective: Chief Complaint  Patient presents with   Foot Pain    Rm11 Patient follow up right foot toe infection/taking antibiotics and pt says he is doing well.    78 year old male presents the office today above concerns.  He states from a foot standpoint he is doing much better and he feels the infection is resolved.  He has not seen any open wounds or any bleeding or drainage.  The swelling the redness is much improved.   He did recently have a fall he reached out to his PCP.  He states that he slid off the golf cart.   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  Slight edema is present but the erythema has resolved.  There is no drainage or pus.  There is no fluctuation or crepitation.  There is no malodor.  No pain. No pain with calf compression, swelling, warmth, erythema Uses a walker.  Assessment: Cellulitis right second toe; hammertoe right 2nd  Plan: -All treatment options discussed with the patient including all alternatives, risks, complications.  -There is a hyperkeratotic lesion of the distal aspect of the right second toe which I debrided today without any complications and no bleeding occurred.  Small scabbing still present.  There is no open lesion identified at this time and the infection seems to be much improved.  I dispensed toe crest and offloading.  Decrease the pressure.  Given the hammertoe contracture and the history of the partial hallux amputation he is to monitor this area closely. -Monitor for any clinical signs or symptoms of infection and directed to call the office immediately should any occur or go to the ER.  Follow-up as scheduled for routine care or sooner if any issues are to arise.  No follow-ups on file.  Donnice JONELLE Fees DPM

## 2024-01-26 NOTE — Telephone Encounter (Signed)
 LMOM for [place] w/ verbal orders.

## 2024-01-27 ENCOUNTER — Telehealth: Payer: Self-pay | Admitting: Pediatrics

## 2024-01-27 NOTE — Telephone Encounter (Signed)
 Spoke with patient.  He was describing abdominal pain after eating. Patient was having problems expressing how he was feeling.   He described a blue pill that sounds like Dicyclomine  which he said is helpful. Asked if I could make an office appointment as maybe it would be easier to talk/describe his issues  to someone face to face.  He asked if I could call him back in a day or two.  Will call him again day after tomorrow.

## 2024-01-27 NOTE — Telephone Encounter (Signed)
 Attempted to reach patient.  No answer, no voicemail.  Will attempt again later today.

## 2024-01-27 NOTE — Telephone Encounter (Signed)
 Patient called wishing to speak with Dr. Suzann regarding abdominal pain he is expiercing. Advises patient I would not be able to directly transfer him due to Dr. Suzann performing procedures. Requesting to speak with nurse further. Please advise, thank you

## 2024-02-03 NOTE — Telephone Encounter (Signed)
 Unable to reach patient.  Will attempt this afternoon.

## 2024-02-04 ENCOUNTER — Telehealth: Payer: Self-pay | Admitting: Family

## 2024-02-04 ENCOUNTER — Other Ambulatory Visit: Payer: Self-pay | Admitting: Family

## 2024-02-04 ENCOUNTER — Ambulatory Visit: Payer: Self-pay

## 2024-02-04 ENCOUNTER — Telehealth: Payer: Self-pay | Admitting: Cardiology

## 2024-02-04 DIAGNOSIS — I4811 Longstanding persistent atrial fibrillation: Secondary | ICD-10-CM

## 2024-02-04 DIAGNOSIS — Z8639 Personal history of other endocrine, nutritional and metabolic disease: Secondary | ICD-10-CM

## 2024-02-04 MED ORDER — LEVOTHYROXINE SODIUM 112 MCG PO TABS: 112.0000 ug | ORAL_TABLET | Freq: Every day | ORAL | 0 refills | Status: AC

## 2024-02-04 NOTE — Telephone Encounter (Signed)
 Copied from CRM #[id number]. Topic: Clinical - Prescription Issue >> Feb 04, 2024  2:12 PM Robinson H wrote: Reason for CRM: Patient calling to check status of refill for the levothyroxine  (SYNTHROID ) 112 MCG tablet shows medication was discontinued.  [place] [phone]

## 2024-02-04 NOTE — Telephone Encounter (Signed)
*  STAT* If patient is at the pharmacy, call can be transferred to refill team.   1. Which medications need to be refilled? (please list name of each medication and dose if known) ELIQUIS  5 MG TABS tablet   2. Which pharmacy/location (including street and city if local pharmacy) is medication to be sent to? [id number] [place], [place] - [address] Phone: [phone]  Fax: [phone]      3. Do they need a 30 day or 90 day supply? Pt made appt for 03/23/24 please refill til then  Pt is out of medication

## 2024-02-04 NOTE — Telephone Encounter (Signed)
 Last refilled by Dr Jeffrie at cardiology. I made RN aware that Pt will need to reach out to cardiology for refills.

## 2024-02-04 NOTE — Telephone Encounter (Signed)
 Copied from CRM [id number]. Topic: Medicare AWV >> Feb 04, 2024 11:02 AM Nathanel DEL wrote: Called 02/04/2024 to sched AWV - NO VOICEMAIL office number blocked  Nathanel Paschal; Care Guide Ambulatory Clinical Support [place] [facility] Direct Dial: [phone]

## 2024-02-04 NOTE — Telephone Encounter (Signed)
 FYI Only or Action Required?: Action required by provider: update on patient condition.  Patient was last seen in primary care on 01/15/2024 by Daryl Setter, NP.  Called Nurse Triage reporting Medication Problem.   Triage Disposition: Call PCP Now  Patient/caregiver understands and will follow disposition?: Yes  Message from [facility] sent at 02/04/2024  2:11 PM EST  Summary: ELIQUIS  5 MG TABS tablet   Reason for Triage: Patient calling to have his ELIQUIS  5 MG TABS tablet refilled states he's out and has reached out to pharmacy and they are waiting on office. Medication doesn't show Melissa last prescribed patient states he doesn't know who prescribed medication but needs it refilled   [place] [phone]         Reason for Disposition  [1] Prescription refill request for ESSENTIAL medicine (i.e., likelihood of harm to patient if not taken) AND [2] triager unable to refill per department policy  Answer Assessment - Initial Assessment Questions 1. DRUG NAME: What medicine do you need to have refilled?     Eliquis  5 mg 2. REFILLS REMAINING: How many refills are remaining? Notes: The label on the medicine or pill bottle will show how many refills are remaining. If there are no refills remaining, then a renewal may be needed.     none 3. EXPIRATION DATE: What is the expiration date? Note: The label states when the prescription will expire, and thus can no longer be refilled.)     unsure 4. PRESCRIBER: Who prescribed it? Note: The prescribing doctor or group is responsible for refill approvals.SABRA Oneil [place], cardiology.  5. PHARMACY: Have you contacted your pharmacy (drugstore)? Note: Some pharmacies will contact the doctor (or NP/PA).      [id number] 6. SYMPTOMS: Do you have any symptoms?     None  Contacted CAL, Kaylin to notify PCP of situation.  She stated that they were not going to do anything with this and that the patient needs to contact the  cardiologist  Phone number of cardiologist provided for patient, [phone]  Protocols used: Medication Refill and Renewal Call-A-AH

## 2024-02-04 NOTE — Telephone Encounter (Signed)
 Refill sent on 01/03/24 for 90 tablets. However, Rx resent.

## 2024-02-05 ENCOUNTER — Ambulatory Visit: Admitting: Podiatry

## 2024-02-05 MED ORDER — APIXABAN 5 MG PO TABS: 5.0000 mg | ORAL_TABLET | Freq: Two times a day (BID) | ORAL | 0 refills | Status: AC

## 2024-02-05 NOTE — Telephone Encounter (Signed)
 Prescription refill request for Eliquis  received. Indication: AFIB Last office visit: 12/03/23 AFIB CLIN[facility], SCHEDULED HERE 03/23/24 Scr:  0.86 12/03/23 Age:  78 Weight: 110.2 KG

## 2024-02-08 NOTE — Telephone Encounter (Signed)
 PA submitted.

## 2024-02-10 NOTE — Telephone Encounter (Signed)
 Have left 3 messages for patient.  No return calls.

## 2024-03-03 ENCOUNTER — Telehealth: Payer: Self-pay

## 2024-03-03 NOTE — Telephone Encounter (Signed)
 Just an FYI.

## 2024-03-03 NOTE — Telephone Encounter (Signed)
 Copied from CRM #[id number]. Topic: General - Other >> Mar 03, 2024  1:45 PM Alexandria E wrote: Reason for CRM: RICK [place] Coy, PT with [facility] called wanting to let PCP know that the patient missed his visit this week for therapy. This is his second missed visit, they will try again next week. Callback number [phone].

## 2024-03-04 ENCOUNTER — Encounter: Attending: Physical Medicine & Rehabilitation | Admitting: Physical Medicine & Rehabilitation

## 2024-03-04 ENCOUNTER — Encounter: Payer: Self-pay | Admitting: Physical Medicine & Rehabilitation

## 2024-03-04 VITALS — BP 153/73 | Ht 75.0 in | Wt 247.0 lb

## 2024-03-04 DIAGNOSIS — G8112 Spastic hemiplegia affecting left dominant side: Secondary | ICD-10-CM | POA: Diagnosis not present

## 2024-03-04 MED ORDER — TRAMADOL HCL ER 100 MG PO TB24: 200.0000 mg | ORAL_TABLET | Freq: Every day | ORAL | 1 refills | Status: DC

## 2024-03-04 MED ORDER — SODIUM CHLORIDE (PF) 0.9 % IJ SOLN
6.0000 mL | Freq: Once | INTRAMUSCULAR | Status: AC
  Administered 2024-03-04: 6 mL

## 2024-03-04 MED ORDER — TRAMADOL HCL 50 MG PO TABS: 50.0000 mg | ORAL_TABLET | Freq: Two times a day (BID) | ORAL | 1 refills | Status: AC

## 2024-03-04 MED ORDER — ONABOTULINUMTOXINA 100 UNITS IJ SOLR
300.0000 [IU] | Freq: Once | INTRAMUSCULAR | Status: AC
  Administered 2024-03-04: 300 [IU] via INTRAMUSCULAR

## 2024-03-04 NOTE — Patient Instructions (Signed)

## 2024-03-04 NOTE — Progress Notes (Signed)
Botox Injection for spasticity using needle EMG guidance  Dilution: 50 Units/ml Indication: Severe spasticity which interferes with ADL,mobility and/or  hygiene and is unresponsive to medication management and other conservative care Informed consent was obtained after describing risks and benefits of the procedure with the patient. This includes bleeding, bruising, infection, excessive weakness, or medication side effects. A REMS form is on file and signed. Needle: 27g 1" needle electrode Number of units per muscle LEFT UE Biceps50  LEFT LE  Medial Hamstrings250 All injections were done after obtaining appropriate EMG activity and after negative drawback for blood. The patient tolerated the procedure well. Post procedure instructions were given. A followup appointment was made.     

## 2024-03-11 ENCOUNTER — Telehealth: Payer: Self-pay

## 2024-03-11 NOTE — Telephone Encounter (Signed)
 Please send him a letter letting him know that PT has been trying to get in touch with him and to unblock our number.

## 2024-03-11 NOTE — Telephone Encounter (Signed)
 Copied from CRM #[id number]. Topic: Clinical - Home Health Verbal Orders >> Mar 11, 2024 11:53 AM Burnard DEL wrote: Caller/Agency: Elveria ALMETA Yamil [facility] Callback Number: [phone]  Any new concerns about the patient? Yes Reported a missed visit for today.She stated that it has been very hard to get ahold of patient an family.They have not been returning calls or text.

## 2024-03-14 NOTE — Telephone Encounter (Signed)
 Letter mailed with this information and request to unblock our phone.

## 2024-03-22 NOTE — Progress Notes (Unsigned)
 " Cardiology Office Note:    Date:  03/23/2024   ID:  [name], DOB 09-Jan-1946, MRN [id number]  PCP:  Daryl Setter, NP   [facility] Providers Cardiologist:  Oneil Parchment, MD Electrophysiologist:  Elspeth Sage, MD (Inactive)     Referring MD: Daryl Setter, NP   Chief complaint: Annual follow-up of CAD     History of Present Illness:   [name] is a 79 y.o. male with a hx of CAD s/p CABG, chronic diastolic heart failure, hypertension, hyperlipidemia, PAF, OSA failed CPAP, CVA, and hx of amiodarone  lung toxicity presents to the clinic for follow-up of chronic cardiovascular conditions.  He is regularly followed by the A-fib clinic, history of prior cardioversions with amiodarone  lung toxicity in the past.  1 cardioversion previously complicated by significant chest wall bleeding and hematoma.  Seen by Dr. Sage on 06/28/2019, dofetilide  was recommended, loading occurred 5/18 - 08/05/2019.  Seen by [facility] 05/2020 for foot pain felt related to neuropathy, no evidence of PAD.  Over the last year patient has primarily been followed by A-fib clinic for routine drug monitoring, has been doing well at those visits.  Presents independently, appears stable from a cardiovascular standpoint. He denies chest pain, palpitations, dyspnea, orthopnea, n, v,  dark/tarry/bloody stools, hematuria, dizziness, syncope, edema, weight gain. States his BPs are well controlled. No complaints.  ROS:   Please see the history of present illness.    All other systems reviewed and are negative.     Past Medical History:  Diagnosis Date   2019 novel coronavirus disease (COVID-19) 11/25/2020   Acute encephalopathy 12/12/2015   Acute respiratory failure with hypoxia and hypercapnia (HCC) 12/12/2015   Allergic rhinitis 02/13/2010   Qualifier: Diagnosis of  By: Georgian ROSALEA CHARM Lamar    Angina at rest 10/22/2015   Arthritis    Ataxia 08/10/2013   Atrial fibrillation (HCC) 11/2018   B12  deficiency 08/11/2013   BACK PAIN, LUMBAR 01/29/2009   Qualifier: Diagnosis of  By: Georgian ROSALEA CHARM Lamar    BENIGN POSITIONAL VERTIGO 11/23/2009   Qualifier: Diagnosis of  By: Georgian ROSALEA CHARM Lamar    Chronic diastolic heart failure (HCC) 04/24/2015   Chronic pain    left sided-Kristeins   Chronically elevated hemidiaphragm    right   Coronary 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 11/04/2015   s/p CABG 2004; CFX DES 2011   Elevated troponin    Essential hypertension 12/12/2006   Qualifier: Diagnosis of  By: Wilhemina RMA, Lucy     GERD 12/12/2006   Qualifier: Diagnosis of  By: Wilhemina KRAFT, Lucy     GOUT 12/12/2006   Qualifier: Diagnosis of  By: Wilhemina RMA, Lucy     Hemorrhoids    Hyperlipidemia LDL goal <70 12/12/2006   Qualifier: Diagnosis of  By: Wilhemina RMA, Lucy      Hypogonadism male 04/10/2011   Hypothyroidism, acquired 02/15/2013   Insomnia 05/21/2013   Long term current use of anticoagulant therapy 06/24/2011   Myocardial infarction [facility])    Obstructive sleep apnea-Failed CPAP 06/17/2007   Failed CPAP Using O 2 for sleep    On home oxygen  therapy    2L at night for sleep apnea   Osteomyelitis of great toe of right foot (HCC) 10/27/2023   Paroxysmal atrial fibrillation (HCC) 12/06/2016   Pre-diabetes    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; resultant hemiplegia of dominant side   Thoracic aorta atherosclerosis 04/27/2017  Tubular adenoma of colon 05/2010   Vasovagal syncope    Vasovagal syncope    Wears glasses     Past Surgical History:  Procedure Laterality Date   AMPUTATION TOE Right 10/28/2023   Procedure: AMPUTATION, TOE;  Surgeon: Malvin Marsa FALCON, DPM;  Location: [facility] OR;  Service: Orthopedics/Podiatry;  Laterality: Right;  R great toe amp partial vs total   CARDIAC CATHETERIZATION N/A 10/25/2015   Procedure: Left Heart Cath and Cors/Grafts Angiography;  Surgeon: Debby DELENA Sor, MD;  Location: [facility] INVASIVE CV LAB;  Service:  Cardiovascular;  Laterality: N/A;   CARDIOVERSION  06/20/2011   Procedure: CARDIOVERSION;  Surgeon: Ezra GORMAN Shuck, MD;  Location: [facility] ENDOSCOPY;  Service: Cardiovascular;  Laterality: N/A;   CARDIOVERSION N/A 04/26/2015   Procedure: CARDIOVERSION;  Surgeon: Leim VEAR Moose, MD;  Location: [facility] ENDOSCOPY;  Service: Cardiovascular;  Laterality: N/A;   CARDIOVERSION N/A 05/10/2015   Procedure: CARDIOVERSION;  Surgeon: Ezra GORMAN Shuck, MD;  Location: [facility] ENDOSCOPY;  Service: Cardiovascular;  Laterality: N/A;   CARDIOVERSION N/A 12/10/2018   Procedure: CARDIOVERSION;  Surgeon: Francyne Headland, MD;  Location: [facility] ENDOSCOPY;  Service: Cardiovascular;  Laterality: N/A;   CARPOMETACARPAL ([facility]) FUSION OF THUMB Right 07/18/2022   Procedure: right carpo metacarpal suspensionplasty with abductor tendon transfer right wrist stenosing tenosynovitis release, right index carpo metacarpal boss excision;  Surgeon: Sissy Cough, MD;  Location: [facility] OR;  Service: Orthopedics;  Laterality: Right;   CATARACT EXTRACTION  05/2010   left eye   CATARACT EXTRACTION  04/2010   right eye   COLONOSCOPY WITH PROPOFOL  N/A 02/22/2020   Procedure: COLONOSCOPY WITH PROPOFOL ;  Surgeon: San Sandor GAILS, DO;  Location: [facility] ENDOSCOPY;  Service: Gastroenterology;  Laterality: N/A;   CORONARY ARTERY BYPASS GRAFT     stent   DORSAL COMPARTMENT RELEASE Right 07/18/2022   Procedure: RELEASE DORSAL COMPARTMENT (DEQUERVAIN);  Surgeon: Sissy Cough, MD;  Location[facility] OR;  Service: Orthopedics;  Laterality: Right;  Anethesia Axillary block/ MAC   ESOPHAGOGASTRODUODENOSCOPY  03-25-2005   EXCISION OF BACK LESION N/A 09/08/2023   Procedure: EXCISION, LESION, BACK;  Surgeon: Polly Cordella DELENA, MD;  Location: [facility] ORS;  Service: General;  Laterality: N/A;  EXCISION OF LESION UPPER MID BACK   HEMORRHOID SURGERY N/A 09/05/2021   Procedure: HEMORRHOIDECTOMY WITH LIGATION AND HEMORRHOIDOPEXY;  Surgeon: Sheldon Standing, MD;  Location: [facility] ORS;  Service:  General;  Laterality: N/A;   LEFT HEART CATH AND CORS/GRAFTS ANGIOGRAPHY N/A 02/19/2018   Procedure: LEFT HEART CATH AND CORS/GRAFTS ANGIOGRAPHY;  Surgeon: Jordan, Peter M, MD;  Location: [facility] INVASIVE CV LAB;  Service: Cardiovascular;  Laterality: N/A;   NASAL SEPTUM SURGERY     PERCUTANEOUS PLACEMENT INTRAVASCULAR STENT CERVICAL CAROTID ARTERY     03-2009; using a drug-eluting platform of the circumflex cornoray artery with a 3.0 x 18 [employer] Promus drug-eluting platform post dilated to 3.75 with a noncompliant balloon.   POLYPECTOMY  02/22/2020   Procedure: POLYPECTOMY;  Surgeon: San Sandor GAILS, DO;  Location: [facility] ENDOSCOPY;  Service: Gastroenterology;;   RECTAL EXAM UNDER ANESTHESIA N/A 09/05/2021   Procedure: ANORECTAL EXAM UNDER ANESTHESIA;  Surgeon: Sheldon Standing, MD;  Location: [facility] ORS;  Service: General;  Laterality: N/A;   TEE WITHOUT CARDIOVERSION  06/20/2011   Procedure: TRANSESOPHAGEAL ECHOCARDIOGRAM (TEE);  Surgeon: Ezra GORMAN Shuck, MD;  Location: [facility] ENDOSCOPY;  Service: Cardiovascular;  Laterality: N/A;    Current Medications: Active Medications[1]   Allergies:   Patient has no known allergies.   Social History   Socioeconomic  History   Marital status: Married    Spouse name: [name]   Number of children: 5   Years of education: 14   Highest education level: Not on file  Occupational History   Occupation: retired    Associate Professor: OTHER    Comment: physicist, medical  Tobacco Use   Smoking status: Never   Smokeless tobacco: Never   Tobacco comments:    Never smoked 05/29/23  Vaping Use   Vaping status: Never Used  Substance and Sexual Activity   Alcohol use: Not Currently   Drug use: No   Sexual activity: Not Currently  Other Topics Concern   Not on file  Social History Narrative   Pt lives with wife. Does have stairs, but patient doesn't use them. Pt has completed technical school   Social Drivers of Health   Tobacco Use: Low Risk (03/04/2024)   Patient  History    Smoking Tobacco Use: Never    Smokeless Tobacco Use: Never    Passive Exposure: Not on file  Financial Resource Strain: Not on file  Food Insecurity: No Food Insecurity (10/25/2023)   Epic    Worried About Programme Researcher, Broadcasting/film/video in the Last Year: Never true    Ran Out of Food in the Last Year: Never true  Transportation Needs: No Transportation Needs (10/25/2023)   Epic    Lack of Transportation (Medical): No    Lack of Transportation (Non-Medical): No  Physical Activity: Not on file  Stress: Not on file  Social Connections: Moderately Integrated (10/25/2023)   Social Connection and Isolation Panel    Frequency of Communication with Friends and Family: More than three times a week    Frequency of Social Gatherings with Friends and Family: Twice a week    Attends Religious Services: 1 to 4 times per year    Active Member of [employer] or Organizations: No    Attends Banker Meetings: Never    Marital Status: Married  Depression (PHQ2-9): Low Risk (03/04/2024)   Depression (PHQ2-9)    PHQ-2 Score: 0  Alcohol Screen: Not on file  Housing: Low Risk (10/25/2023)   Epic    Unable to Pay for Housing in the Last Year: No    Number of Times Moved in the Last Year: 0    Homeless in the Last Year: No  Utilities: Not At Risk (10/25/2023)   Epic    Threatened with loss of utilities: No  Health Literacy: Not on file     Family History: The patient's family history includes Heart disease in his father; Lung cancer in his father; Stroke in his mother. There is no history of Colon cancer, Esophageal cancer, or Liver disease.  EKGs/Labs/Other Studies Reviewed:    The following studies were reviewed today:  EKG Interpretation Date/Time:  Wednesday March 23 2024 11:24:57 EST Ventricular Rate:  81 PR Interval:  186 QRS Duration:  132 QT Interval:  416 QTC Calculation: 483 R Axis:   -49  Text Interpretation: Sinus rhythm with Premature atrial complexes in a pattern of  bigeminy Left axis deviation Left ventricular hypertrophy with QRS widening ( R in aVL , Cornell product ) No significant change since prior study Confirmed by Elex Mainwaring [phone]) on 03/23/2024 11:54:39 AM    Recent Labs: 10/25/2023: ALT 43 12/03/2023: BUN 12; Creatinine, Ser 0.86; Magnesium  2.1; Potassium 5.1; Sodium 143 01/15/2024: Hemoglobin 13.6; Platelets 171; TSH 1.56  Recent Lipid Panel    Component Value Date/Time   CHOL 156 05/27/2022  0917   CHOL 138 12/05/2016 1100   TRIG 186.0 (H) 05/27/2022 0917   HDL 43.20 05/27/2022 0917   HDL 46 12/05/2016 1100   CHOLHDL 4 05/27/2022 0917   VLDL 37.2 05/27/2022 0917   LDLCALC 75 05/27/2022 0917   LDLCALC 67 12/05/2016 1100     Risk Assessment/Calculations:    CHA2DS2-VASc Score = 8   This indicates a 10.8% annual risk of stroke. The patient's score is based upon: CHF History: 1 HTN History: 1 Diabetes History: 1 Stroke History: 2 Vascular Disease History: 1 Age Score: 2 Gender Score: 0               Physical Exam:    VS:  BP 136/78   Pulse 74   Ht 6' 3 (1.905 m)   Wt 244 lb (110.7 kg)   SpO2 92%   BMI 30.50 kg/m        Wt Readings from Last 3 Encounters:  03/23/24 244 lb (110.7 kg)  03/04/24 247 lb (112 kg)  01/18/24 242 lb 15.2 oz (110.2 kg)     GEN:  Well nourished, well developed in no acute distress HEENT: Normal NECK: No JVD; No carotid bruits CARDIAC:  S1-S2 normal, RRR, no murmurs, rubs, gallops RESPIRATORY:  Clear to auscultation without rales, wheezing or rhonchi  MUSCULOSKELETAL:  No edema; No deformity  SKIN: Warm and dry NEUROLOGIC:  Alert and oriented x 3 PSYCHIATRIC:  Normal affect       Assessment & Plan Coronary artery disease involving native coronary artery of native heart without angina pectoris S/P CABG (coronary artery bypass graft) LHC 2019: LIMA to LAD patent, SVG to D1 patent, SVG to OM1 patent, Graft to LPDA 100% stenosed. Ost-prox RCA occluded, Ost-mid LCX occluded,  Ost-prox LAD 95% stenosed Denies Cp, SOB, palpitations, near syncope No asa 2/2 eliquis  Continue eliquis  5 mg BID  Continue lipitor  40 mg daily Longstanding persistent atrial fibrillation (HCC) Hypercoagulable state due to longstanding persistent atrial fibrillation (HCC) Hx of prior cardioversions and amiodarone  lung toxicity Reports compliance w/ tikosyn , cardizem , and eliquis  States he's not gone into a. Fib since starting the Tikosyn  Continue meds prescribed/monitored by A. Fib clinic/EP Chronic heart failure with preserved ejection fraction (HFpEF) (HCC) Echo 02/2018: LVEF 55-60, no RWMA, G1DD, mild mitral regurg Takes lasix  once every 4 days for LE edema, feels its well managed, last dose last night. Uses compression stockings Continue to monitor for new s/s Will update BMP to monitor kidney function Essential hypertension Reports BPs controlled at home Continue benazepril  15 mg daily Pure hypercholesterolemia Update fasting lipids LDL goal of <55 given extensive CAD, age, HTN Continue atorvastatin  as above Could consider increasing atorvastatin  to 80 if not at goal  Disposition: Follow up in 6 months or sooner if needed, proceed to ED with any new or worsening symptoms.             Medication Adjustments/Labs and Tests Ordered: Current medicines are reviewed at length with the patient today.  Concerns regarding medicines are outlined above.  Orders Placed This Encounter  Procedures   Lipid panel   Basic metabolic panel with GFR   EKG 87-Ozji   No orders of the defined types were placed in this encounter.   Patient Instructions  Thank you for choosing [facility]!     Medication Instructions:  No medication changes were made during today's visit.  *If you need a refill on your cardiac medications before your next appointment, please call your pharmacy*  Lab Work: Return for fasting labs .........LIPIDS, BMET If you have labs (blood work) drawn  today and your tests are completely normal, you will receive your results only by: MyChart Message (if you have MyChart) OR A paper copy in the mail If you have any lab test that is abnormal or we need to change your treatment, we will call you to review the results.   Testing/Procedures: No procedures were ordered during today's visit.   Your next appointment:   6 month(s)   Provider:   Oneil Parchment, MD     Follow-Up: At [facility], you and your health needs are our priority.  As part of our continuing mission to provide you with exceptional heart care, we have created designated Provider Care Teams.  These Care Teams include your primary Cardiologist (physician) and Advanced Practice Providers (APPs -  Physician Assistants and Nurse Practitioners) who all work together to provide you with the care you need, when you need it. We recommend signing up for the patient portal called MyChart.  Sign up information is provided on this After Visit Summary.  MyChart is used to connect with patients for Virtual Visits (Telemedicine).  Patients are able to view lab/test results, encounter notes, upcoming appointments, etc.  Non-urgent messages can be sent to your provider as well.   To learn more about what you can do with MyChart, go to [email or web address].      Signed, Donabelle Molden E Boots Mcglown, NP  03/23/2024 8:32 PM    [facility]     [1]  Current Meds  Medication Sig   acetaminophen  (TYLENOL ) 325 MG tablet Take 2 tablets (650 mg total) by mouth every 6 (six) hours as needed for mild pain (pain score 1-3) or fever (or Fever >/= 101).   allopurinol  (ZYLOPRIM ) 100 MG tablet Take 2 tablets (200 mg total) by mouth daily.   apixaban  (ELIQUIS ) 5 MG TABS tablet Take 1 tablet (5 mg total) by mouth 2 (two) times daily.   atorvastatin  (LIPITOR ) 40 MG tablet Take 1 tablet (40 mg total) by mouth daily.   benazepril  (LOTENSIN ) 10 MG tablet Take 1 tablet (10 mg total) by mouth daily.  Take with 5mg  tablet to equal 15mg  daily   benazepril  (LOTENSIN ) 5 MG tablet Take 1 tablet (5 mg total) by mouth daily.   cephALEXin  (KEFLEX ) 500 MG capsule Take 1 capsule (500 mg total) by mouth 4 (four) times daily.   diltiazem  (CARDIZEM  CD) 180 MG 24 hr capsule Take 1 capsule (180 mg total) by mouth daily.   dofetilide  (TIKOSYN ) 500 MCG capsule Take 1 capsule (500 mcg total) by mouth 2 (two) times daily.   Ferrous Sulfate  (IRON ) 325 (65 Fe) MG TABS Take 1 tablet (325 mg total) by mouth every other day.   folic acid  (FOLVITE ) 1 MG tablet TAKE 1 TABLET BY MOUTH EVERY DAY   furosemide  (LASIX ) 40 MG tablet Take 1 tablet (40 mg total) by mouth daily as needed for fluid or edema.   gabapentin  (NEURONTIN ) 100 MG capsule TAKE 1 CAPSULE BY MOUTH AT BEDTIME.   levothyroxine  (SYNTHROID ) 112 MCG tablet Take 1 tablet (112 mcg total) by mouth daily before breakfast.   traMADol  (ULTRAM ) 50 MG tablet Take 1 tablet (50 mg total) by mouth 2 (two) times daily.   traMADol  (ULTRAM -ER) 100 MG 24 hr tablet Take 2 tablets (200 mg total) by mouth daily.   "

## 2024-03-23 ENCOUNTER — Encounter: Payer: Self-pay | Admitting: Emergency Medicine

## 2024-03-23 ENCOUNTER — Ambulatory Visit: Admitting: Emergency Medicine

## 2024-03-23 VITALS — BP 136/78 | HR 74 | Ht 75.0 in | Wt 244.0 lb

## 2024-03-23 DIAGNOSIS — E7800 Pure hypercholesterolemia, unspecified: Secondary | ICD-10-CM | POA: Insufficient documentation

## 2024-03-23 DIAGNOSIS — I4811 Longstanding persistent atrial fibrillation: Secondary | ICD-10-CM | POA: Insufficient documentation

## 2024-03-23 DIAGNOSIS — I2510 Atherosclerotic heart disease of native coronary artery without angina pectoris: Secondary | ICD-10-CM | POA: Diagnosis not present

## 2024-03-23 DIAGNOSIS — I10 Essential (primary) hypertension: Secondary | ICD-10-CM | POA: Insufficient documentation

## 2024-03-23 DIAGNOSIS — Z951 Presence of aortocoronary bypass graft: Secondary | ICD-10-CM | POA: Insufficient documentation

## 2024-03-23 DIAGNOSIS — D6869 Other thrombophilia: Secondary | ICD-10-CM | POA: Diagnosis present

## 2024-03-23 DIAGNOSIS — I5032 Chronic diastolic (congestive) heart failure: Secondary | ICD-10-CM | POA: Insufficient documentation

## 2024-03-23 NOTE — Patient Instructions (Signed)
 Thank you for choosing [facility]!     Medication Instructions:  No medication changes were made during today's visit.  *If you need a refill on your cardiac medications before your next appointment, please call your pharmacy*   Lab Work: Return for fasting labs .........LIPIDS, BMET If you have labs (blood work) drawn today and your tests are completely normal, you will receive your results only by: MyChart Message (if you have MyChart) OR A paper copy in the mail If you have any lab test that is abnormal or we need to change your treatment, we will call you to review the results.   Testing/Procedures: No procedures were ordered during today's visit.   Your next appointment:   6 month(s)   Provider:   Oneil Parchment, MD     Follow-Up: At [facility], you and your health needs are our priority.  As part of our continuing mission to provide you with exceptional heart care, we have created designated Provider Care Teams.  These Care Teams include your primary Cardiologist (physician) and Advanced Practice Providers (APPs -  Physician Assistants and Nurse Practitioners) who all work together to provide you with the care you need, when you need it. We recommend signing up for the patient portal called MyChart.  Sign up information is provided on this After Visit Summary.  MyChart is used to connect with patients for Virtual Visits (Telemedicine).  Patients are able to view lab/test results, encounter notes, upcoming appointments, etc.  Non-urgent messages can be sent to your provider as well.   To learn more about what you can do with MyChart, go to [email or web address].

## 2024-03-23 NOTE — Assessment & Plan Note (Signed)
 Update fasting lipids LDL goal of <55 given extensive CAD, age, HTN Continue atorvastatin  as above Could consider increasing atorvastatin  to 80 if not at goal

## 2024-03-23 NOTE — Assessment & Plan Note (Signed)
 Hx of prior cardioversions and amiodarone  lung toxicity Reports compliance w/ tikosyn , cardizem , and eliquis  States he's not gone into a. Fib since starting the Tikosyn  Continue meds prescribed/monitored by A. Fib clinic/EP

## 2024-03-23 NOTE — Assessment & Plan Note (Signed)
 Reports BPs controlled at home Continue benazepril  15 mg daily

## 2024-03-24 ENCOUNTER — Other Ambulatory Visit: Payer: Self-pay | Admitting: *Deleted

## 2024-03-24 ENCOUNTER — Other Ambulatory Visit (INDEPENDENT_AMBULATORY_CARE_PROVIDER_SITE_OTHER)

## 2024-03-24 DIAGNOSIS — I4811 Longstanding persistent atrial fibrillation: Secondary | ICD-10-CM

## 2024-03-24 DIAGNOSIS — E7800 Pure hypercholesterolemia, unspecified: Secondary | ICD-10-CM

## 2024-03-24 NOTE — Addendum Note (Signed)
 Addended by: ESTELLE GILLIS D on: 03/24/2024 11:59 AM   Modules accepted: Orders

## 2024-03-25 ENCOUNTER — Ambulatory Visit: Payer: Self-pay | Admitting: Emergency Medicine

## 2024-03-25 LAB — LIPID PANEL
Chol/HDL Ratio: 3 ratio (ref 0.0–5.0)
Cholesterol, Total: 149 mg/dL (ref 100–199)
HDL: 49 mg/dL
LDL Chol Calc (NIH): 84 mg/dL (ref 0–99)
Triglycerides: 84 mg/dL (ref 0–149)
VLDL Cholesterol Cal: 16 mg/dL (ref 5–40)

## 2024-03-25 LAB — BASIC METABOLIC PANEL WITH GFR
BUN/Creatinine Ratio: 20 (ref 10–24)
BUN: 19 mg/dL (ref 8–27)
CO2: 24 mmol/L (ref 20–29)
Calcium: 9.6 mg/dL (ref 8.6–10.2)
Chloride: 104 mmol/L (ref 96–106)
Creatinine, Ser: 0.96 mg/dL (ref 0.76–1.27)
Glucose: 114 mg/dL — ABNORMAL HIGH (ref 70–99)
Potassium: 4.5 mmol/L (ref 3.5–5.2)
Sodium: 142 mmol/L (ref 134–144)
eGFR: 80 mL/min/1.73

## 2024-04-01 NOTE — Telephone Encounter (Signed)
 Called patient in regards to lab results, NA and was unable to leave message, called wife [name] and she said [name] was at a doctors appointment at the moment and would be available around 4pm, informed wife I would call back around that time

## 2024-04-03 NOTE — Progress Notes (Unsigned)
 "   Chief Complaint: Urinary symptomes  History of Present Illness:  [name] is a 79 y.o. male who is seen in consultation from Daryl Setter, NP for evaluation of ***.   Past Medical History:  Past Medical History:  Diagnosis Date   2019 novel coronavirus disease (COVID-19) 11/25/2020   Acute encephalopathy 12/12/2015   Acute respiratory failure with hypoxia and hypercapnia (HCC) 12/12/2015   Allergic rhinitis 02/13/2010   Qualifier: Diagnosis of  By: Georgian ROSALEA CHARM Lamar    Angina at rest 10/22/2015   Arthritis    Ataxia 08/10/2013   Atrial fibrillation (HCC) 11/2018   B12 deficiency 08/11/2013   BACK PAIN, LUMBAR 01/29/2009   Qualifier: Diagnosis of  By: Georgian ROSALEA CHARM Lamar    BENIGN POSITIONAL VERTIGO 11/23/2009   Qualifier: Diagnosis of  By: Georgian ROSALEA CHARM Lamar    Chronic diastolic heart failure (HCC) 04/24/2015   Chronic pain    left sided-Kristeins   Chronically elevated hemidiaphragm    right   Coronary 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 11/04/2015   s/p CABG 2004; CFX DES 2011   Elevated troponin    Essential hypertension 12/12/2006   Qualifier: Diagnosis of  By: Wilhemina RMA, Lucy     GERD 12/12/2006   Qualifier: Diagnosis of  By: Wilhemina KRAFT, Lucy     GOUT 12/12/2006   Qualifier: Diagnosis of  By: Wilhemina RMA, Lucy     Hemorrhoids    Hyperlipidemia LDL goal <70 12/12/2006   Qualifier: Diagnosis of  By: Wilhemina RMA, Lucy      Hypogonadism male 04/10/2011   Hypothyroidism, acquired 02/15/2013   Insomnia 05/21/2013   Long term current use of anticoagulant therapy 06/24/2011   Myocardial infarction (HCC)    Obstructive sleep apnea-Failed CPAP 06/17/2007   Failed CPAP Using O 2 for sleep    On home oxygen  therapy    2L at night for sleep apnea   Osteomyelitis of great toe of right foot (HCC) 10/27/2023   Paroxysmal atrial fibrillation (HCC) 12/06/2016   Pre-diabetes    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; resultant  hemiplegia of dominant side   Thoracic aorta atherosclerosis 04/27/2017   Tubular adenoma of colon 05/2010   Vasovagal syncope    Vasovagal syncope    Wears glasses     Past Surgical History:  Past Surgical History:  Procedure Laterality Date   AMPUTATION TOE Right 10/28/2023   Procedure: AMPUTATION, TOE;  Surgeon: Malvin Marsa JULIANNA, DPM;  Location: [facility] OR;  Service: Orthopedics/Podiatry;  Laterality: Right;  R great toe amp partial vs total   CARDIAC CATHETERIZATION N/A 10/25/2015   Procedure: Left Heart Cath and Cors/Grafts Angiography;  Surgeon: Debby DELENA Sor, MD;  Location: [facility] INVASIVE CV LAB;  Service: Cardiovascular;  Laterality: N/A;   CARDIOVERSION  06/20/2011   Procedure: CARDIOVERSION;  Surgeon: Ezra GORMAN Shuck, MD;  Location: [facility] ENDOSCOPY;  Service: Cardiovascular;  Laterality: N/A;   CARDIOVERSION N/A 04/26/2015   Procedure: CARDIOVERSION;  Surgeon: Leim VEAR Moose, MD;  Location: [facility] ENDOSCOPY;  Service: Cardiovascular;  Laterality: N/A;   CARDIOVERSION N/A 05/10/2015   Procedure: CARDIOVERSION;  Surgeon: Ezra GORMAN Shuck, MD;  Location: [facility] ENDOSCOPY;  Service: Cardiovascular;  Laterality: N/A;   CARDIOVERSION N/A 12/10/2018   Procedure: CARDIOVERSION;  Surgeon: Francyne Headland, MD;  Location: [facility] ENDOSCOPY;  Service: Cardiovascular;  Laterality: N/A;   CARPOMETACARPAL (CMC) FUSION OF THUMB Right 07/18/2022   Procedure: right carpo metacarpal suspensionplasty with abductor  tendon transfer right wrist stenosing tenosynovitis release, right index carpo metacarpal boss excision;  Surgeon: Sissy Cough, MD;  Location: [facility] OR;  Service: Orthopedics;  Laterality: Right;   CATARACT EXTRACTION  05/2010   left eye   CATARACT EXTRACTION  04/2010   right eye   COLONOSCOPY WITH PROPOFOL  N/A 02/22/2020   Procedure: COLONOSCOPY WITH PROPOFOL ;  Surgeon: San Sandor GAILS, DO;  Location: [facility] ENDOSCOPY;  Service: Gastroenterology;  Laterality: N/A;   CORONARY ARTERY BYPASS GRAFT     stent    DORSAL COMPARTMENT RELEASE Right 07/18/2022   Procedure: RELEASE DORSAL COMPARTMENT (DEQUERVAIN);  Surgeon: Sissy Cough, MD;  Location: [facility] OR;  Service: Orthopedics;  Laterality: Right;  Anethesia Axillary block/ MAC   ESOPHAGOGASTRODUODENOSCOPY  03-25-2005   EXCISION OF BACK LESION N/A 09/08/2023   Procedure: EXCISION, LESION, BACK;  Surgeon: Polly Cordella LABOR, MD;  Location: [facility] ORS;  Service: General;  Laterality: N/A;  EXCISION OF LESION UPPER MID BACK   HEMORRHOID SURGERY N/A 09/05/2021   Procedure: HEMORRHOIDECTOMY WITH LIGATION AND HEMORRHOIDOPEXY;  Surgeon: Sheldon Standing, MD;  Location: [facility] ORS;  Service: General;  Laterality: N/A;   LEFT HEART CATH AND CORS/GRAFTS ANGIOGRAPHY N/A 02/19/2018   Procedure: LEFT HEART CATH AND CORS/GRAFTS ANGIOGRAPHY;  Surgeon: Jordan, Peter M, MD;  Location: [facility] INVASIVE CV LAB;  Service: Cardiovascular;  Laterality: N/A;   NASAL SEPTUM SURGERY     PERCUTANEOUS PLACEMENT INTRAVASCULAR STENT CERVICAL CAROTID ARTERY     03-2009; using a drug-eluting platform of the circumflex cornoray artery with a 3.0 x 18 [employer] Promus drug-eluting platform post dilated to 3.75 with a noncompliant balloon.   POLYPECTOMY  02/22/2020   Procedure: POLYPECTOMY;  Surgeon: San Sandor GAILS, DO;  Location: [facility] ENDOSCOPY;  Service: Gastroenterology;;   RECTAL EXAM UNDER ANESTHESIA N/A 09/05/2021   Procedure: ANORECTAL EXAM UNDER ANESTHESIA;  Surgeon: Sheldon Standing, MD;  Location: [facility] ORS;  Service: General;  Laterality: N/A;   TEE WITHOUT CARDIOVERSION  06/20/2011   Procedure: TRANSESOPHAGEAL ECHOCARDIOGRAM (TEE);  Surgeon: Ezra GORMAN Shuck, MD;  Location: [facility] ENDOSCOPY;  Service: Cardiovascular;  Laterality: N/A;    Allergies:  Allergies[1]  Family History:  Family History  Problem Relation Age of Onset   Stroke Mother        deceased-MINISTROKES   Lung cancer Father        deceased   Heart disease Father    Colon cancer Neg Hx    Esophageal cancer Neg Hx    Liver  disease Neg Hx     Social History:  Social History[2]  Review of symptoms:  Constitutional:  Negative for unexplained weight loss, night sweats, fever, chills ENT:  Negative for nose bleeds, sinus pain, painful swallowing CV:  Negative for chest pain, shortness of breath, exercise intolerance, palpitations, loss of consciousness Resp:  Negative for cough, wheezing, shortness of breath GI:  Negative for nausea, vomiting, diarrhea, bloody stools GU:  Positives noted in HPI; otherwise negative for gross hematuria, dysuria, urinary incontinence Neuro:  Negative for seizures, poor balance, limb weakness, slurred speech Psych:  Negative for lack of energy, depression, anxiety Endocrine:  Negative for polydipsia, polyuria, symptoms of hypoglycemia (dizziness, hunger, sweating) Hematologic:  Negative for anemia, purpura, petechia, prolonged or excessive bleeding, use of anticoagulants  Allergic:  Negative for difficulty breathing or choking as a result of exposure to anything; no shellfish allergy; no allergic response (rash/itch) to materials, foods  Physical exam: There were no vitals taken for this visit. GENERAL APPEARANCE:  Well appearing,  well developed, well nourished, NAD HEENT: Atraumatic, Normocephalic. NECK: Normal appearance LUNGS: Normal inspiratory and expiratory excursion HEART: Regular Rate ABDOMEN: ***. GU: Phallus normal, no lesions. Scrotal skin normal. Testicles/epididymal structures normal. Meatus normal. Normal anal sphincter tone, prostate ***mL, symmetric, non nodular, non tender. EXTREMITIES: Moves all extremities well.  Without clubbing, cyanosis, or edema. NEUROLOGIC:  Alert and oriented x 3, normal gait, CN II-XII grossly intact.  MENTAL STATUS:  Appropriate. SKIN:  Warm, dry and intact.    Results: No results found. However, due to the size of the patient record, not all encounters were searched. Please check Results Review for a complete set of results.  I  have reviewed referring/prior physicians notes  I have reviewed urinalysis  I have reviewed PSA results  I have reviewed prior imaging  I have reviewed urine culture results  Assessment: ***   Plan: ***     [1] No Known Allergies [2]  Social History Tobacco Use   Smoking status: Never   Smokeless tobacco: Never   Tobacco comments:    Never smoked 05/29/23  Vaping Use   Vaping status: Never Used  Substance Use Topics   Alcohol use: Not Currently   Drug use: No   "

## 2024-04-04 ENCOUNTER — Ambulatory Visit: Admitting: Urology

## 2024-04-04 DIAGNOSIS — R399 Unspecified symptoms and signs involving the genitourinary system: Secondary | ICD-10-CM

## 2024-04-05 ENCOUNTER — Telehealth: Payer: Self-pay

## 2024-04-05 MED ORDER — TRAMADOL HCL ER 200 MG PO TB24: 200.0000 mg | ORAL_TABLET | Freq: Every day | ORAL | 1 refills | Status: AC

## 2024-04-05 NOTE — Telephone Encounter (Signed)
 Pt requesting a c/b to discuss results.

## 2024-04-05 NOTE — Telephone Encounter (Signed)
 Patient called stating that Tramadol  ER 100 mg take 2 tablets once daily was sent in for him but he would rather have Tramadol  ER 200 mg take 1 table once daily like he was on previously. Can you send in new Rx reflecting the change?

## 2024-04-06 ENCOUNTER — Other Ambulatory Visit: Payer: Self-pay

## 2024-04-06 DIAGNOSIS — E7800 Pure hypercholesterolemia, unspecified: Secondary | ICD-10-CM

## 2024-04-06 MED ORDER — ATORVASTATIN CALCIUM 80 MG PO TABS: 80.0000 mg | ORAL_TABLET | Freq: Every day | ORAL | 3 refills | Status: AC

## 2024-04-06 NOTE — Telephone Encounter (Signed)
 Called pt to discuss labs. Unable to contact pt on either number listed in chart.

## 2024-04-12 ENCOUNTER — Ambulatory Visit: Admitting: Podiatry

## 2024-04-12 ENCOUNTER — Encounter: Payer: Self-pay | Admitting: Podiatry

## 2024-04-12 DIAGNOSIS — M79674 Pain in right toe(s): Secondary | ICD-10-CM | POA: Diagnosis not present

## 2024-04-12 DIAGNOSIS — M79675 Pain in left toe(s): Secondary | ICD-10-CM | POA: Diagnosis not present

## 2024-04-12 DIAGNOSIS — B351 Tinea unguium: Secondary | ICD-10-CM

## 2024-04-12 NOTE — Progress Notes (Signed)
 Patient presents for evaluation and treatment of tenderness and some redness around nails feet.  Tenderness around toes with walking and wearing shoes.  Physical exam:  General appearance: Alert, pleasant, and in no acute distress.  Vascular: Pedal pulses: DP 2/4 B/L, PT 0/4 B/L.  Moderate edema lower legs bilaterally.  Capillary refill time immediate bilaterally  Neurologic:  Dermatologic:  Nails thickened, disfigured, discolored 1-5 BL with subungual debris.  Redness and hypertrophic nail folds along nail folds bilaterally but no signs of drainage or infection.  Musculoskeletal:     Diagnosis: 1. 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 2. Pain toes 1 through 5 bilaterally.  Plan: -Debrided onychomycotic nails 1 through 5 bilaterally.  Sharply debrided nails with nail clipper and reduced with a power bur.  Return 3 months [facility]

## 2024-04-13 ENCOUNTER — Ambulatory Visit: Admitting: Podiatry

## 2024-04-20 ENCOUNTER — Ambulatory Visit: Admitting: Family

## 2024-05-04 ENCOUNTER — Ambulatory Visit: Admitting: Urology

## 2024-06-02 ENCOUNTER — Ambulatory Visit (HOSPITAL_COMMUNITY): Admitting: Physician Assistant

## 2024-06-03 ENCOUNTER — Encounter: Admitting: Physical Medicine & Rehabilitation

## 2024-07-11 ENCOUNTER — Ambulatory Visit: Admitting: Podiatry
